# Patient Record
Sex: Female | Born: 1945
Health system: Southern US, Community
[De-identification: ages and names within clinical notes are randomized; demographics above are authoritative.]

## PROBLEM LIST (undated history)

## (undated) DIAGNOSIS — Z972 Presence of dental prosthetic device (complete) (partial): Secondary | ICD-10-CM

## (undated) DIAGNOSIS — Z923 Personal history of irradiation: Secondary | ICD-10-CM

## (undated) DIAGNOSIS — E785 Hyperlipidemia, unspecified: Secondary | ICD-10-CM

## (undated) DIAGNOSIS — M6740 Ganglion, unspecified site: Secondary | ICD-10-CM

## (undated) DIAGNOSIS — K219 Gastro-esophageal reflux disease without esophagitis: Secondary | ICD-10-CM

## (undated) DIAGNOSIS — I739 Peripheral vascular disease, unspecified: Secondary | ICD-10-CM

## (undated) DIAGNOSIS — C801 Malignant (primary) neoplasm, unspecified: Secondary | ICD-10-CM

## (undated) DIAGNOSIS — IMO0001 Reserved for inherently not codable concepts without codable children: Secondary | ICD-10-CM

## (undated) DIAGNOSIS — I10 Essential (primary) hypertension: Secondary | ICD-10-CM

## (undated) DIAGNOSIS — M1990 Unspecified osteoarthritis, unspecified site: Secondary | ICD-10-CM

## (undated) DIAGNOSIS — Z808 Family history of malignant neoplasm of other organs or systems: Secondary | ICD-10-CM

## (undated) DIAGNOSIS — G4730 Sleep apnea, unspecified: Secondary | ICD-10-CM

## (undated) DIAGNOSIS — H269 Unspecified cataract: Secondary | ICD-10-CM

## (undated) DIAGNOSIS — R0600 Dyspnea, unspecified: Secondary | ICD-10-CM

## (undated) DIAGNOSIS — G629 Polyneuropathy, unspecified: Secondary | ICD-10-CM

## (undated) DIAGNOSIS — F419 Anxiety disorder, unspecified: Secondary | ICD-10-CM

## (undated) DIAGNOSIS — R011 Cardiac murmur, unspecified: Secondary | ICD-10-CM

## (undated) DIAGNOSIS — M5090 Cervical disc disorder, unspecified, unspecified cervical region: Secondary | ICD-10-CM

## (undated) DIAGNOSIS — M25512 Pain in left shoulder: Secondary | ICD-10-CM

## (undated) DIAGNOSIS — R42 Dizziness and giddiness: Secondary | ICD-10-CM

## (undated) DIAGNOSIS — Z8489 Family history of other specified conditions: Secondary | ICD-10-CM

## (undated) DIAGNOSIS — D649 Anemia, unspecified: Secondary | ICD-10-CM

## (undated) DIAGNOSIS — Z973 Presence of spectacles and contact lenses: Secondary | ICD-10-CM

## (undated) DIAGNOSIS — K589 Irritable bowel syndrome without diarrhea: Secondary | ICD-10-CM

## (undated) HISTORY — DX: Polyneuropathy, unspecified: G62.9

## (undated) HISTORY — DX: Personal history of irradiation: Z92.3

## (undated) HISTORY — PX: VAGINAL HYSTERECTOMY: SUR661

## (undated) HISTORY — PX: OTHER SURGICAL HISTORY: SHX169

## (undated) HISTORY — PX: CHOLECYSTECTOMY: SHX55

## (undated) HISTORY — PX: ABDOMINAL HYSTERECTOMY: SHX81

## (undated) HISTORY — DX: Family history of malignant neoplasm of other organs or systems: Z80.8

## (undated) HISTORY — PX: PARTIAL HYSTERECTOMY: SHX80

## (undated) HISTORY — PX: BREAST EXCISIONAL BIOPSY: SUR124

## (undated) HISTORY — PX: BREAST SURGERY: SHX581

## (undated) HISTORY — DX: Cervical disc disorder, unspecified, unspecified cervical region: M50.90

## (undated) HISTORY — PX: BLADDER SURGERY: SHX569

## (undated) HISTORY — DX: Sleep apnea, unspecified: G47.30

## (undated) HISTORY — PX: COLONOSCOPY: SHX174

## (undated) HISTORY — DX: Unspecified cataract: H26.9

## (undated) HISTORY — DX: Gastro-esophageal reflux disease without esophagitis: K21.9

## (undated) HISTORY — DX: Hyperlipidemia, unspecified: E78.5

## (undated) HISTORY — DX: Reserved for inherently not codable concepts without codable children: IMO0001

## (undated) HISTORY — DX: Essential (primary) hypertension: I10

## (undated) HISTORY — DX: Unspecified osteoarthritis, unspecified site: M19.90

## (undated) HISTORY — DX: Dizziness and giddiness: R42

---

## 1997-07-12 ENCOUNTER — Emergency Department (HOSPITAL_COMMUNITY): Admission: EM | Admit: 1997-07-12 | Discharge: 1997-07-12 | Payer: Self-pay | Admitting: Emergency Medicine

## 1997-08-22 ENCOUNTER — Encounter: Admission: RE | Admit: 1997-08-22 | Discharge: 1997-11-20 | Payer: Self-pay | Admitting: Anesthesiology

## 1997-11-29 ENCOUNTER — Ambulatory Visit (HOSPITAL_COMMUNITY): Admission: RE | Admit: 1997-11-29 | Discharge: 1997-11-29 | Payer: Self-pay | Admitting: Orthopedic Surgery

## 1997-11-29 ENCOUNTER — Encounter: Payer: Self-pay | Admitting: Orthopedic Surgery

## 1998-02-07 ENCOUNTER — Emergency Department (HOSPITAL_COMMUNITY): Admission: EM | Admit: 1998-02-07 | Discharge: 1998-02-07 | Payer: Self-pay | Admitting: Emergency Medicine

## 1998-02-07 ENCOUNTER — Encounter: Payer: Self-pay | Admitting: *Deleted

## 1998-10-30 ENCOUNTER — Ambulatory Visit (HOSPITAL_COMMUNITY): Admission: RE | Admit: 1998-10-30 | Discharge: 1998-10-30 | Payer: Self-pay | Admitting: Orthopedic Surgery

## 1998-10-30 ENCOUNTER — Encounter: Payer: Self-pay | Admitting: Orthopedic Surgery

## 1999-01-08 ENCOUNTER — Other Ambulatory Visit: Admission: RE | Admit: 1999-01-08 | Discharge: 1999-01-08 | Payer: Self-pay | Admitting: *Deleted

## 1999-01-16 ENCOUNTER — Ambulatory Visit (HOSPITAL_COMMUNITY): Admission: RE | Admit: 1999-01-16 | Discharge: 1999-01-16 | Payer: Self-pay | Admitting: *Deleted

## 1999-01-16 ENCOUNTER — Encounter: Payer: Self-pay | Admitting: General Surgery

## 2000-08-13 ENCOUNTER — Observation Stay (HOSPITAL_COMMUNITY): Admission: RE | Admit: 2000-08-13 | Discharge: 2000-08-14 | Payer: Self-pay | Admitting: Urology

## 2000-08-24 ENCOUNTER — Emergency Department (HOSPITAL_COMMUNITY): Admission: EM | Admit: 2000-08-24 | Discharge: 2000-08-24 | Payer: Self-pay

## 2000-09-03 ENCOUNTER — Observation Stay (HOSPITAL_COMMUNITY): Admission: RE | Admit: 2000-09-03 | Discharge: 2000-09-04 | Payer: Self-pay | Admitting: Urology

## 2002-10-12 ENCOUNTER — Ambulatory Visit (HOSPITAL_BASED_OUTPATIENT_CLINIC_OR_DEPARTMENT_OTHER): Admission: RE | Admit: 2002-10-12 | Discharge: 2002-10-12 | Payer: Self-pay | Admitting: Internal Medicine

## 2002-11-13 ENCOUNTER — Ambulatory Visit (HOSPITAL_BASED_OUTPATIENT_CLINIC_OR_DEPARTMENT_OTHER): Admission: RE | Admit: 2002-11-13 | Discharge: 2002-11-13 | Payer: Self-pay | Admitting: Internal Medicine

## 2003-06-13 ENCOUNTER — Emergency Department (HOSPITAL_COMMUNITY): Admission: EM | Admit: 2003-06-13 | Discharge: 2003-06-13 | Payer: Self-pay | Admitting: Emergency Medicine

## 2003-08-30 ENCOUNTER — Emergency Department (HOSPITAL_COMMUNITY): Admission: EM | Admit: 2003-08-30 | Discharge: 2003-08-31 | Payer: Self-pay | Admitting: Emergency Medicine

## 2005-03-13 ENCOUNTER — Encounter: Payer: Self-pay | Admitting: Physician Assistant

## 2005-05-20 ENCOUNTER — Encounter: Admission: RE | Admit: 2005-05-20 | Discharge: 2005-05-20 | Payer: Self-pay | Admitting: Obstetrics and Gynecology

## 2005-07-01 ENCOUNTER — Other Ambulatory Visit: Admission: RE | Admit: 2005-07-01 | Discharge: 2005-07-01 | Payer: Self-pay | Admitting: Obstetrics and Gynecology

## 2005-11-19 ENCOUNTER — Ambulatory Visit (HOSPITAL_BASED_OUTPATIENT_CLINIC_OR_DEPARTMENT_OTHER): Admission: RE | Admit: 2005-11-19 | Discharge: 2005-11-19 | Payer: Self-pay | Admitting: Internal Medicine

## 2005-11-22 ENCOUNTER — Ambulatory Visit: Payer: Self-pay | Admitting: Internal Medicine

## 2006-07-13 ENCOUNTER — Emergency Department (HOSPITAL_COMMUNITY): Admission: EM | Admit: 2006-07-13 | Discharge: 2006-07-14 | Payer: Self-pay | Admitting: Emergency Medicine

## 2007-05-28 ENCOUNTER — Emergency Department (HOSPITAL_COMMUNITY): Admission: EM | Admit: 2007-05-28 | Discharge: 2007-05-29 | Payer: Self-pay | Admitting: Emergency Medicine

## 2007-08-02 ENCOUNTER — Emergency Department (HOSPITAL_BASED_OUTPATIENT_CLINIC_OR_DEPARTMENT_OTHER): Admission: EM | Admit: 2007-08-02 | Discharge: 2007-08-02 | Payer: Self-pay | Admitting: Emergency Medicine

## 2007-10-28 ENCOUNTER — Emergency Department (HOSPITAL_BASED_OUTPATIENT_CLINIC_OR_DEPARTMENT_OTHER): Admission: EM | Admit: 2007-10-28 | Discharge: 2007-10-28 | Payer: Self-pay | Admitting: Emergency Medicine

## 2008-10-14 ENCOUNTER — Emergency Department (HOSPITAL_COMMUNITY): Admission: EM | Admit: 2008-10-14 | Discharge: 2008-10-14 | Payer: Self-pay | Admitting: Family Medicine

## 2008-11-03 ENCOUNTER — Ambulatory Visit: Payer: Self-pay | Admitting: Physician Assistant

## 2008-11-03 DIAGNOSIS — F411 Generalized anxiety disorder: Secondary | ICD-10-CM | POA: Insufficient documentation

## 2008-11-03 DIAGNOSIS — E785 Hyperlipidemia, unspecified: Secondary | ICD-10-CM

## 2008-11-03 DIAGNOSIS — E119 Type 2 diabetes mellitus without complications: Secondary | ICD-10-CM | POA: Insufficient documentation

## 2008-11-03 DIAGNOSIS — N3281 Overactive bladder: Secondary | ICD-10-CM | POA: Insufficient documentation

## 2008-11-03 DIAGNOSIS — R011 Cardiac murmur, unspecified: Secondary | ICD-10-CM

## 2008-11-03 DIAGNOSIS — R319 Hematuria, unspecified: Secondary | ICD-10-CM

## 2008-11-03 DIAGNOSIS — G4733 Obstructive sleep apnea (adult) (pediatric): Secondary | ICD-10-CM | POA: Insufficient documentation

## 2008-11-03 DIAGNOSIS — N318 Other neuromuscular dysfunction of bladder: Secondary | ICD-10-CM | POA: Insufficient documentation

## 2008-11-04 ENCOUNTER — Encounter: Payer: Self-pay | Admitting: Physician Assistant

## 2008-11-08 ENCOUNTER — Ambulatory Visit (HOSPITAL_COMMUNITY): Admission: RE | Admit: 2008-11-08 | Discharge: 2008-11-08 | Payer: Self-pay | Admitting: Internal Medicine

## 2008-11-08 ENCOUNTER — Ambulatory Visit: Payer: Self-pay | Admitting: Cardiology

## 2008-11-08 ENCOUNTER — Encounter (INDEPENDENT_AMBULATORY_CARE_PROVIDER_SITE_OTHER): Payer: Self-pay | Admitting: Internal Medicine

## 2008-11-10 ENCOUNTER — Encounter: Payer: Self-pay | Admitting: Physician Assistant

## 2008-11-14 ENCOUNTER — Ambulatory Visit: Payer: Self-pay | Admitting: Physician Assistant

## 2008-11-16 ENCOUNTER — Encounter: Payer: Self-pay | Admitting: Physician Assistant

## 2008-11-20 ENCOUNTER — Encounter: Payer: Self-pay | Admitting: Physician Assistant

## 2008-11-28 ENCOUNTER — Ambulatory Visit: Payer: Self-pay | Admitting: Physician Assistant

## 2008-12-15 ENCOUNTER — Encounter: Payer: Self-pay | Admitting: Physician Assistant

## 2008-12-15 DIAGNOSIS — M19042 Primary osteoarthritis, left hand: Secondary | ICD-10-CM

## 2008-12-15 DIAGNOSIS — M19041 Primary osteoarthritis, right hand: Secondary | ICD-10-CM | POA: Insufficient documentation

## 2008-12-20 ENCOUNTER — Encounter: Payer: Self-pay | Admitting: Physician Assistant

## 2008-12-29 ENCOUNTER — Telehealth: Payer: Self-pay | Admitting: Physician Assistant

## 2009-01-16 ENCOUNTER — Ambulatory Visit: Payer: Self-pay | Admitting: Physician Assistant

## 2009-01-16 DIAGNOSIS — K581 Irritable bowel syndrome with constipation: Secondary | ICD-10-CM | POA: Insufficient documentation

## 2009-01-16 DIAGNOSIS — K589 Irritable bowel syndrome without diarrhea: Secondary | ICD-10-CM | POA: Insufficient documentation

## 2009-01-16 DIAGNOSIS — F418 Other specified anxiety disorders: Secondary | ICD-10-CM | POA: Insufficient documentation

## 2009-01-17 ENCOUNTER — Ambulatory Visit: Payer: Self-pay | Admitting: Physician Assistant

## 2009-01-18 ENCOUNTER — Ambulatory Visit: Payer: Self-pay | Admitting: Internal Medicine

## 2009-01-18 ENCOUNTER — Encounter: Payer: Self-pay | Admitting: Physician Assistant

## 2009-01-30 ENCOUNTER — Ambulatory Visit: Payer: Self-pay | Admitting: Physician Assistant

## 2009-01-30 DIAGNOSIS — I10 Essential (primary) hypertension: Secondary | ICD-10-CM

## 2009-02-02 ENCOUNTER — Encounter: Admission: RE | Admit: 2009-02-02 | Discharge: 2009-02-02 | Payer: Self-pay | Admitting: Internal Medicine

## 2009-02-04 ENCOUNTER — Encounter: Payer: Self-pay | Admitting: Physician Assistant

## 2009-02-13 ENCOUNTER — Ambulatory Visit: Payer: Self-pay | Admitting: Physician Assistant

## 2009-02-13 DIAGNOSIS — R82998 Other abnormal findings in urine: Secondary | ICD-10-CM

## 2009-02-13 LAB — CONVERTED CEMR LAB
CO2: 20 meq/L (ref 19–32)
Calcium: 9.6 mg/dL (ref 8.4–10.5)
Chloride: 108 meq/L (ref 96–112)
Creatinine, Ser: 0.67 mg/dL (ref 0.40–1.20)
Glucose, Bld: 145 mg/dL — ABNORMAL HIGH (ref 70–99)
Glucose, Urine, Semiquant: NEGATIVE
Ketones, urine, test strip: NEGATIVE
Potassium: 4.2 meq/L (ref 3.5–5.3)
Urobilinogen, UA: 0.2
WBC Urine, dipstick: NEGATIVE
pH: 5

## 2009-02-14 ENCOUNTER — Encounter: Payer: Self-pay | Admitting: Physician Assistant

## 2009-02-16 ENCOUNTER — Encounter: Payer: Self-pay | Admitting: Physician Assistant

## 2009-02-27 ENCOUNTER — Ambulatory Visit: Payer: Self-pay | Admitting: Physician Assistant

## 2009-02-27 ENCOUNTER — Telehealth: Payer: Self-pay | Admitting: Physician Assistant

## 2009-02-27 LAB — CONVERTED CEMR LAB
BUN: 14 mg/dL (ref 6–23)
CO2: 22 meq/L (ref 19–32)
Chloride: 108 meq/L (ref 96–112)
Creatinine, Ser: 0.69 mg/dL (ref 0.40–1.20)
Potassium: 4.2 meq/L (ref 3.5–5.3)

## 2009-02-28 ENCOUNTER — Encounter: Payer: Self-pay | Admitting: Physician Assistant

## 2009-03-13 ENCOUNTER — Ambulatory Visit: Payer: Self-pay | Admitting: Physician Assistant

## 2009-03-13 DIAGNOSIS — R0602 Shortness of breath: Secondary | ICD-10-CM | POA: Insufficient documentation

## 2009-03-13 LAB — CONVERTED CEMR LAB: Blood Glucose, Fingerstick: 155

## 2009-03-20 ENCOUNTER — Telehealth: Payer: Self-pay | Admitting: Physician Assistant

## 2009-05-01 ENCOUNTER — Telehealth: Payer: Self-pay | Admitting: Physician Assistant

## 2009-05-15 ENCOUNTER — Ambulatory Visit: Payer: Self-pay | Admitting: Physician Assistant

## 2009-05-15 ENCOUNTER — Telehealth: Payer: Self-pay | Admitting: Physician Assistant

## 2009-05-15 DIAGNOSIS — E559 Vitamin D deficiency, unspecified: Secondary | ICD-10-CM | POA: Insufficient documentation

## 2009-05-15 LAB — CONVERTED CEMR LAB
Glucose, Urine, Semiquant: NEGATIVE
Hgb A1c MFr Bld: 7.5 %
Ketones, urine, test strip: NEGATIVE
Specific Gravity, Urine: <1.005
Urobilinogen, UA: 0.2
WBC Urine, dipstick: NEGATIVE

## 2009-05-17 LAB — CONVERTED CEMR LAB
Nitrite: NEGATIVE
Protein, ur: NEGATIVE mg/dL
Specific Gravity, Urine: <1.005 — ABNORMAL LOW (ref 1.005–1.030)

## 2009-05-22 ENCOUNTER — Ambulatory Visit: Payer: Self-pay | Admitting: Physician Assistant

## 2009-05-24 ENCOUNTER — Telehealth: Payer: Self-pay | Admitting: Physician Assistant

## 2009-05-30 ENCOUNTER — Encounter: Payer: Self-pay | Admitting: Physician Assistant

## 2009-05-30 ENCOUNTER — Ambulatory Visit: Payer: Self-pay | Admitting: Internal Medicine

## 2009-05-31 LAB — CONVERTED CEMR LAB
Cholesterol: 182 mg/dL (ref 0–200)
LDL Cholesterol: 95 mg/dL (ref 0–99)
Total CHOL/HDL Ratio: 3.6
VLDL: 36 mg/dL (ref 0–40)

## 2009-08-15 ENCOUNTER — Telehealth: Payer: Self-pay | Admitting: Physician Assistant

## 2009-08-17 ENCOUNTER — Telehealth: Payer: Self-pay | Admitting: Physician Assistant

## 2009-08-17 DIAGNOSIS — K5909 Other constipation: Secondary | ICD-10-CM | POA: Insufficient documentation

## 2009-08-17 DIAGNOSIS — K5900 Constipation, unspecified: Secondary | ICD-10-CM | POA: Insufficient documentation

## 2009-08-20 ENCOUNTER — Encounter (INDEPENDENT_AMBULATORY_CARE_PROVIDER_SITE_OTHER): Payer: Self-pay | Admitting: *Deleted

## 2009-08-22 ENCOUNTER — Encounter: Payer: Self-pay | Admitting: Physician Assistant

## 2009-08-29 ENCOUNTER — Encounter (INDEPENDENT_AMBULATORY_CARE_PROVIDER_SITE_OTHER): Payer: Self-pay | Admitting: Nurse Practitioner

## 2009-08-30 ENCOUNTER — Telehealth: Payer: Self-pay | Admitting: Physician Assistant

## 2010-02-10 LAB — CONVERTED CEMR LAB
ALT: 13 units/L (ref 0–35)
Albumin: 4.6 g/dL (ref 3.5–5.2)
Basophils Relative: 0 % (ref 0–1)
Bilirubin Urine: NEGATIVE
Blood in Urine, dipstick: NEGATIVE
Eosinophils Absolute: 0.1 10*3/uL (ref 0.0–0.7)
Hemoglobin: 13.4 g/dL (ref 12.0–15.0)
Hgb A1c MFr Bld: 6.9 % — ABNORMAL HIGH (ref 4.6–6.1)
Ketones, urine, test strip: NEGATIVE
Lymphocytes Relative: 30 % (ref 12–46)
MCHC: 32.9 g/dL (ref 30.0–36.0)
MCV: 85.9 fL (ref 78.0–100.0)
Monocytes Relative: 5 % (ref 3–12)
Neutro Abs: 4.5 10*3/uL (ref 1.7–7.7)
Neutrophils Relative %: 64 % (ref 43–77)
Nitrite: NEGATIVE
Platelets: 260 10*3/uL (ref 150–400)
TSH: 1.733 microintl units/mL (ref 0.350–4.500)
Total Bilirubin: 0.5 mg/dL (ref 0.3–1.2)
Total CHOL/HDL Ratio: 5.2
Total Protein: 7.2 g/dL (ref 6.0–8.3)
Triglycerides: 143 mg/dL (ref <150)
Urobilinogen, UA: 0.2
VLDL: 29 mg/dL (ref 0–40)
Vit D, 25-Hydroxy: 11 ng/mL — ABNORMAL LOW (ref 30–89)
WBC Urine, dipstick: NEGATIVE
WBC: 7.1 10*3/uL (ref 4.0–10.5)
Whiff Test: NEGATIVE
pH: 5.5

## 2010-02-14 NOTE — Assessment & Plan Note (Signed)
Summary: FU/VITAMIN D LEVEL///GK   Vital Signs:  Patient profile:   65 year old female Height:      65 inches Weight:      198 pounds BMI:     33.07 Temp:     98.0 degrees F oral Pulse rate:   80 / minute Pulse rhythm:   regular Resp:     18 per minute BP sitting:   145 / 83  (left arm) Cuff size:   large  Vitals Entered By: [place] Shannon (May 15, 2009 3:25 PM)  Serial Vital Signs/Assessments:  Time      Position  BP       Pulse  Resp  Temp     By 4:00 PM             128/78                         Tereso Newcomer PA-C  CC: pt says she has trouble with her urinary... pt says she goes more frequently... pt says she is in pain.. pt says she has weakness in both legs and  pain in her left ankle that just comes and then stops... pt says she is constapated and has been doing OTC methods..., Hypertension Management Is Patient Diabetic? Yes Pain Assessment Patient in pain? no      CBG Result 116  Does patient need assistance? Functional Status Self care Ambulation Normal   Primary Care Provider:  Tereso Newcomer PA-C  CC:  pt says she has trouble with her urinary... pt says she goes more frequently... pt says she is in pain.. pt says she has weakness in both legs and  pain in her left ankle that just comes and then stops... pt says she is constapated and has been doing OTC methods... and Hypertension Management.  History of Present Illness: Here for follow up.  Dysuria and nocturia:  Has a h/o overactive bladder.  Has a h/o bladder tacking.  I put her back on Vesicare when she first est. and she did better at first.  She complains of pelvic pressure and urgency.  No incontinence.  Denies dysuria.  Emptying bladder provides relief.  But, her discomfort comes back.  She notes nocturia (7-8 times per night at times).  Denies heavy caffeine use or spicy foods.  Did have one glass of tea recently but this did not make worse.  Usually drinks plenty of water.  If she increase water intake, her  symptoms improve.  No hesitancy.  She notes symptoms for about 3 months.  Vesicare improved symptoms at first, but now symptoms have resurfaced.  HTN:  Feels like up due to lack of sleep from nocturia.  Depression:  Not taking zoloft and not seeing Marchelle Folks.  Feels like her mood is ok.  No thoughts of suicide.  High chol:  Taking Crestor MWF.  No reports of myalgias.     Hypertension History:      She denies headache, chest pain, dyspnea with exertion, and syncope.  She notes no problems with any antihypertensive medication side effects.        Positive major cardiovascular risk factors include female age 43 years old or older, diabetes, hyperlipidemia, and hypertension.  Negative major cardiovascular risk factors include non-tobacco-user status.     Problems Prior to Update: 1)  Vitamin D Deficiency  (ICD-268.9) 2)  Dyspnea  (ICD-786.05) 3)  Urinalysis, Abnormal  (ICD-791.9) 4)  Irritable Bowel Syndrome  (  ICD-564.1) 5)  Preventive Health Care  (ICD-V70.0) 6)  Depression  (ICD-311) 7)  Osteoarthritis  (ICD-715.90) 8)  Hematuria Unspecified  (ICD-599.70) 9)  Murmur  (ICD-785.2) 10)  Family History Diabetes 1st Degree Relative  (ICD-V18.0) 11)  Anxiety  (ICD-300.00) 12)  Essential Hypertension, Benign  (ICD-401.1) 13)  Overactive Bladder  (ICD-596.51) 14)  Sleep Apnea  (ICD-780.57) 15)  Hyperlipidemia  (ICD-272.4) 16)  Diabetes Mellitus, Type II  (ICD-250.00)  Current Medications (verified): 1)  Lorazepam 0.5 Mg Tabs (Lorazepam) .... One Tab Twice Daily 2)  Fish Oil 1000 Mg Caps (Omega-3 Fatty Acids) .... Once Daily 3)  Vitamin B-12 1000 Mcg Tabs (Cyanocobalamin) .... One Tab Daily 4)  Acetaminophen 500 Mg Tabs (Acetaminophen) .... One Tab At Night 5)  Vesicare 5 Mg Tabs (Solifenacin Succinate) .... Take 1 Tablet By Mouth Once A Day 6)  Zoloft 50 Mg Tabs (Sertraline Hcl) .... 1/2 Tablet Daily X 1 Week, Then Increase To 1 By Mouth Once Daily. 7)  Crestor 10 Mg Tabs (Rosuvastatin  Calcium) .... Take 1 Tablet By Mouth Once A Day On Monday, Wednesday and Friday Only. 8)  Vitamin D (Ergocalciferol) 50000 Unit Caps (Ergocalciferol) .Marland Kitchen.. 1 By Mouth Every Week For 12 Weeks. 9)  Cozaar 100 Mg Tabs (Losartan Potassium) .... Take 1 Tablet By Mouth Once A Day For Blood Pressure 10)  Norvasc 5 Mg Tabs (Amlodipine Besylate) .... Take One Tablet By Mouth Daily  Allergies (verified): No Known Drug Allergies  Past History:  Past Medical History: Last updated: 12/15/2008 Diabetes mellitus, type II    a.  diet controlled    b.  dx 2007 Borderline HTN    a.  given Diovan/HCT but multiple SEs; BP ok without meds Hyperlipidemia    a.  intol. to Lipitor and Zocor in past (changed diet) Sleep Apnea   a.  sleep study 11/07 Overactive Bladder Anxiety Panic disorder with agoraphobia   a.  eval by psychologist in 2009 Echocardiogram 10/2008:  Normal LVF; EF 60%; mild LVH; mild MR Osteoarthritis   a.  Left shoulder Right elbow ulnar neuropathy 2008 Chronic Constipation   a.  eval at [facility] Fibrocystic Breast Disease Congenital Left Ear Deafness Overactive Bladder  Physical Exam  General:  alert, well-developed, and well-nourished.   Head:  normocephalic and atraumatic.   Neck:  supple.   Lungs:  normal breath sounds, no crackles, and no wheezes.   Heart:  normal rate and regular rhythm.   Abdomen:  soft and non-tender.   Extremities:  no edema  Neurologic:  alert & oriented X3 and cranial nerves II-XII intact.   Psych:  normally interactive and good eye contact.     Impression & Recommendations:  Problem # 1:  ESSENTIAL HYPERTENSION, BENIGN (ICD-401.1) repeat blood pressure is at goal continue current meds  Her updated medication list for this problem includes:    Cozaar 100 Mg Tabs (Losartan potassium) .Marland Kitchen... Take 1 tablet by mouth once a day for blood pressure    Norvasc 5 Mg Tabs (Amlodipine besylate) .Marland Kitchen... Take one tablet by mouth daily  Problem # 2:  DIABETES  MELLITUS, TYPE II (ICD-250.00) refer back to dietician if A1C still over 7 after 3 mos, start metformin  Her updated medication list for this problem includes:    Cozaar 100 Mg Tabs (Losartan potassium) .Marland Kitchen... Take 1 tablet by mouth once a day for blood pressure  Orders: Capillary Blood Glucose/CBG (82948) Hgb A1C (16109UE)  Problem # 3:  OVERACTIVE BLADDER (ICD-596.51) urine  sterile will send to urology  Orders: T-Urinalysis ([address]-60454) Urology Referral (Urology)  Complete Medication List: 1)  Lorazepam 0.5 Mg Tabs (Lorazepam) .... One tab twice daily 2)  Fish Oil 1000 Mg Caps (Omega-3 fatty acids) .... Once daily 3)  Vitamin B-12 1000 Mcg Tabs (Cyanocobalamin) .... One tab daily 4)  Acetaminophen 500 Mg Tabs (Acetaminophen) .... One tab at night 5)  Vesicare 5 Mg Tabs (Solifenacin succinate) .... Take 1 tablet by mouth once a day 6)  Zoloft 50 Mg Tabs (Sertraline hcl) .... 1/2 tablet daily x 1 week, then increase to 1 by mouth once daily. 7)  Crestor 10 Mg Tabs (Rosuvastatin calcium) .... Take 1 tablet by mouth once a day on monday, wednesday and friday only. 8)  Vitamin D (ergocalciferol) 50000 Unit Caps (Ergocalciferol) .Marland Kitchen.. 1 by mouth every week for 12 weeks. 9)  Cozaar 100 Mg Tabs (Losartan potassium) .... Take 1 tablet by mouth once a day for blood pressure 10)  Norvasc 5 Mg Tabs (Amlodipine besylate) .... Take one tablet by mouth daily  Other Orders: T-Vitamin D (25-Hydroxy) (09811-91478)  Hypertension Assessment/Plan:      The patient's hypertensive risk group is category C: Target organ damage and/or diabetes.  Her calculated 10 year risk of coronary heart disease is > 32 %.  Today's blood pressure is 145/83.  Her blood pressure goal is < 130/80.  Patient Instructions: 1)  Arrange fasting lipids in the next 2 weeks (Dx 272.4). 2)  Schedule appointment with Drucilla Schmidt for diabetes refresher. 3)  Please schedule a follow-up appointment in 3 months with Scott for  diabetes and blood pressure. 4)  I will arrange for you to see urology.  Someone will contact you with an appointment. 5)     Laboratory Results   Urine Tests    Routine Urinalysis   Color: lt. yellow Appearance: Clear Glucose: negative   (Normal Range: Negative) Bilirubin: negative   (Normal Range: Negative) Ketone: negative   (Normal Range: Negative) Spec. Gravity: <1.005   (Normal Range: 1.003-1.035) Blood: negative   (Normal Range: Negative) pH: 5.5   (Normal Range: 5.0-8.0) Protein: negative   (Normal Range: Negative) Urobilinogen: 0.2   (Normal Range: 0-1) Nitrite: negative   (Normal Range: Negative) Leukocyte Esterace: negative   (Normal Range: Negative)     Blood Tests   Date/Time Received: May 15, 2009 3:39 PM   HGBA1C: 7.5%   (Normal Range: Non-Diabetic - 3-6%   Control Diabetic - 6-8%) CBG Random:: 116mg /dL

## 2010-02-14 NOTE — Letter (Signed)
Summary: RECORDS FROM [facility]IOR P[facility]  RECORDS FROM [facility] P[facility]   Imported By: Arta Bruce 02/06/2009 10:32:49  _____________________________________________________________________  External Attachment:    Type:   Image     Comment:   External Document

## 2010-02-14 NOTE — Assessment & Plan Note (Signed)
Summary: 6 WEEK FU///KT   Vital Signs:  Patient profile:   65 year old female Height:      65 inches Weight:      198 pounds BMI:     33.07 Temp:     97.9 degrees F oral Pulse rate:   87 / minute Pulse rhythm:   regular Resp:     18 per minute BP sitting:   150 / 90  (left arm) Cuff size:   large  Vitals Entered By: [place] Shannon (March 13, 2009 9:40 AM) CC: six week f/u... pt says she needs a new rx for bp med since she has new dose(cozzar)... pt says she has not been sleeping lately still.... pt says she has been getting hungry at night and has been having hot flashes..., Hypertension Management Is Patient Diabetic? Yes Pain Assessment Patient in pain? no      CBG Result 155  Does patient need assistance? Functional Status Self care Ambulation Normal   Primary Care Provider:  Tereso Newcomer PA-C  CC:  six week f/u... pt says she needs a new rx for bp med since she has new dose(cozzar)... pt says she has not been sleeping lately still.... pt says she has been getting hungry at night and has been having hot flashes... and Hypertension Management.  History of Present Illness: Here for f/u. Did not get higher dose of cozaar and is only taking 50 mg once daily since Sun.  Has not received norvasc yet.    Has a lot on nonspecific symptoms but states she feels good. She is taking vesicare and denies any further incontinence. But, she is having a lot of nocturia.  No dysuria.  No hesitancy or urgency.  SHe had a neg culture a few weeks ago.  She does drink a lot of water.  No excessive caffeine use.  I have avoided diuretics with her symptoms.  She notes hot flashes and chills.   TSH was normal in Jan.  She saw LCSW x 1 and did not go back.  She had a high score on the PHQ9 and I had previously asked her to take zoloft as she also has anxiety.  She is worried about side effects.  She does get panicked at times.  No suicidal ideations.  She is requesting to go back to [place].     She has some mild DOE.  NYHA 2.  SHe denies assoc CP, nausea, diaph.  She has gained some weight.  She is sleeping less with her nocturia.  She has noted fatigue.  CBC was ok in Jan at [facility].  Echo previously normal.  ECG in Jan was ok as well.   Hypertension History:      She complains of headache, but denies chest pain, dyspnea with exertion, and syncope.  She notes no problems with any antihypertensive medication side effects.        Positive major cardiovascular risk factors include female age 5 years old or older, diabetes, hyperlipidemia, and hypertension.  Negative major cardiovascular risk factors include non-tobacco-user status.     Current Medications (verified): 1)  Lorazepam 0.5 Mg Tabs (Lorazepam) .... One Tab Twice Daily 2)  Fish Oil 1000 Mg Caps (Omega-3 Fatty Acids) .... Once Daily 3)  Vitamin B-12 1000 Mcg Tabs (Cyanocobalamin) .... One Tab Daily 4)  Acetaminophen 500 Mg Tabs (Acetaminophen) .... One Tab At Night 5)  Vesicare 5 Mg Tabs (Solifenacin Succinate) .... Take 1 Tablet By Mouth Once A Day 6)  Zoloft 50 Mg Tabs (Sertraline Hcl) .... 1/2 Tablet Daily X 1 Week, Then Increase To 1 By Mouth Once Daily. 7)  Crestor 10 Mg Tabs (Rosuvastatin Calcium) .... Take 1 Tablet By Mouth Once A Day On Monday, Wednesday and Friday Only. 8)  Vitamin D (Ergocalciferol) 50000 Unit Caps (Ergocalciferol) .Marland Kitchen.. 1 By Mouth Every Week For 12 Weeks. 9)  Cozaar 50 Mg Tabs (Losartan Potassium) .... Take 1 Tablet By Mouth Once A Day For Blood Pressure 10)  Norvasc 5 Mg Tabs (Amlodipine Besylate) .... Take One Tablet By Mouth Daily  Allergies (verified): No Known Drug Allergies  Past History:  Past Medical History: Last updated: 12/15/2008 Diabetes mellitus, type II    a.  diet controlled    b.  dx 2007 Borderline HTN    a.  given Diovan/HCT but multiple SEs; BP ok without meds Hyperlipidemia    a.  intol. to Lipitor and Zocor in past (changed diet) Sleep Apnea   a.  sleep study  11/07 Overactive Bladder Anxiety Panic disorder with agoraphobia   a.  eval by psychologist in 2009 Echocardiogram 10/2008:  Normal LVF; EF 60%; mild LVH; mild MR Osteoarthritis   a.  Left shoulder Right elbow ulnar neuropathy 2008 Chronic Constipation   a.  eval at [facility] Fibrocystic Breast Disease Congenital Left Ear Deafness Overactive Bladder  Family History: Reviewed history from 11/03/2008 and no changes required. Family History Diabetes 1st degree relative (sister, father) CA - cousin with brain cancer Family History Hypertension (mom, dad, 2 sisters, bro)  Physical Exam  General:  alert, well-developed, and well-nourished.   Head:  normocephalic and atraumatic.   Neck:  supple and no carotid bruits.   Lungs:  normal breath sounds, no crackles, and no wheezes.   Heart:  normal rate and regular rhythm.   Abdomen:  soft, non-tender, and no hepatomegaly.   no pelvic pain with palp Neurologic:  alert & oriented X3 and cranial nerves II-XII intact.   Psych:  normally interactive and good eye contact.     Impression & Recommendations:  Problem # 1:  DEPRESSION (ICD-311) suspect she is having a lot of symptoms related to this she is willing to try zoloft now refer back to LCSW f/u with me in 2 weeks  Her updated medication list for this problem includes:    Lorazepam 0.5 Mg Tabs (Lorazepam) ..... One tab twice daily    Zoloft 50 Mg Tabs (Sertraline hcl) .Marland Kitchen... 1/2 tablet daily x 1 week, then increase to 1 by mouth once daily.  Problem # 2:  OVERACTIVE BLADDER (ICD-596.51) overall improved however, she has nocturia sounds like 2/2 increased fluid intake try to adjust if no better, refer to urology  Problem # 3:  ESSENTIAL HYPERTENSION, BENIGN (ICD-401.1) uncontrolled needs to get on proper dose of meds  Her updated medication list for this problem includes:    Cozaar 100 Mg Tabs (Losartan potassium) .Marland Kitchen... Take 1 tablet by mouth once a day for blood pressure     Norvasc 5 Mg Tabs (Amlodipine besylate) .Marland Kitchen... Take one tablet by mouth daily  Problem # 4:  DYSPNEA (ICD-786.05) I think related to lack of sleep and weight gain monitor for now  Complete Medication List: 1)  Lorazepam 0.5 Mg Tabs (Lorazepam) .... One tab twice daily 2)  Fish Oil 1000 Mg Caps (Omega-3 fatty acids) .... Once daily 3)  Vitamin B-12 1000 Mcg Tabs (Cyanocobalamin) .... One tab daily 4)  Acetaminophen 500 Mg Tabs (Acetaminophen) .... One tab  at night 5)  Vesicare 5 Mg Tabs (Solifenacin succinate) .... Take 1 tablet by mouth once a day 6)  Zoloft 50 Mg Tabs (Sertraline hcl) .... 1/2 tablet daily x 1 week, then increase to 1 by mouth once daily. 7)  Crestor 10 Mg Tabs (Rosuvastatin calcium) .... Take 1 tablet by mouth once a day on monday, wednesday and friday only. 8)  Vitamin D (ergocalciferol) 50000 Unit Caps (Ergocalciferol) .Marland Kitchen.. 1 by mouth every week for 12 weeks. 9)  Cozaar 100 Mg Tabs (Losartan potassium) .... Take 1 tablet by mouth once a day for blood pressure 10)  Norvasc 5 Mg Tabs (Amlodipine besylate) .... Take one tablet by mouth daily  Hypertension Assessment/Plan:      The patient's hypertensive risk group is category C: Target organ damage and/or diabetes.  Her calculated 10 year risk of coronary heart disease is > 32 %.  Today's blood pressure is 150/90.  Her blood pressure goal is < 130/80.  Patient Instructions: 1)  Start the norvasc for blood pressure. 2)  Get the new dose of cozaar and start for your blood pressure. 3)  Start the Zoloft. 4)  Schedule follow up appt with Ethelene Browns. 5)  Schedule follow up with Scott in 2 weeks for 311. 6)  Stop drinking water at 7pm every night.  Limit caffeine to the morning and no more than 1-2 cups per day.  Avoid sodas, spicy foods as much as possible. 7)  See if these steps help your urination at night. 8)  I think the zoloft will also help your hot flashes. 9)  If you can take an aspirin, start taking aspirin 81  mg once daily. Prescriptions: COZAAR 100 MG TABS (LOSARTAN POTASSIUM) Take 1 tablet by mouth once a day for blood pressure  #30 x 5   Entered and Authorized by:   Tereso Newcomer PA-C   Signed by:   Tereso Newcomer PA-C on 03/13/2009   Method used:   Print then Give to Patient   RxID:   [id number]

## 2010-02-14 NOTE — Assessment & Plan Note (Signed)
Summary: 2 WEEK FU FOR BP///KT   Vital Signs:  Patient profile:   65 year old female Height:      65 inches Weight:      198 pounds BMI:     33.07 Temp:     97.8 degrees F oral Pulse rate:   77 / minute Pulse rhythm:   regular Resp:     20 per minute BP sitting:   158 / 89  (left arm) Cuff size:   large  Vitals Entered By: [place] Shannon (January 30, 2009 9:56 AM)  Serial Vital Signs/Assessments:  Time      Position  BP       Pulse  Resp  Temp     By 10:51 AM            160/90                         Tereso Newcomer PA-C  CC: two week f/u.... pt says she is still constapated..... Is Patient Diabetic? Yes Pain Assessment Patient in pain? no      CBG Result 162  Does patient need assistance? Functional Status Self care Ambulation Normal   Primary Care Provider:  Tereso Newcomer PA-C  CC:  two week f/u.... pt says she is still constapated.....Marland Kitchen  History of Present Illness: 65 year old female returns for two-week followup.  I placed her on Zoloft at last visit.  This visit was to followup on the Zoloft and to recheck her blood pressure.  Depression: The patient has met with the Child psychotherapist.  She has decided to not start Zoloft.  She does not feel she needs it.  She likes to avoid medications that may be addictive.  I explained her that SSRIs without addictive.  She understands this but would like to hold off on medication.  She denies suicidal ideations.  Her mood seems to be stable.  Hypertension: The patient had previously been on Diovan/HCTZ.  She notes that she was very lethargic with this and lightheaded.  It sounds as though she may have not needed as high of a dose as she was taking.  She did have an echocardiogram a couple of months ago that showed mild LVH and normal LV function.  She denies chest pain, shortness of breath, syncope, headaches.  Problems Prior to Update: 1)  Irritable Bowel Syndrome  (ICD-564.1) 2)  Preventive Health Care  (ICD-V70.0) 3)  Depression   (ICD-311) 4)  Osteoarthritis  (ICD-715.90) 5)  Hematuria Unspecified  (ICD-599.70) 6)  Murmur  (ICD-785.2) 7)  Family History Diabetes 1st Degree Relative  (ICD-V18.0) 8)  Anxiety  (ICD-300.00) 9)  Essential Hypertension, Benign  (ICD-401.1) 10)  Overactive Bladder  (ICD-596.51) 11)  Sleep Apnea  (ICD-780.57) 12)  Hyperlipidemia  (ICD-272.4) 13)  Diabetes Mellitus, Type II  (ICD-250.00)  Current Medications (verified): 1)  Lorazepam 0.5 Mg Tabs (Lorazepam) .... One Tab Twice Daily 2)  Fish Oil 1000 Mg Caps (Omega-3 Fatty Acids) .... Once Daily 3)  Vitamin B-12 1000 Mcg Tabs (Cyanocobalamin) .... One Tab Daily 4)  Acetaminophen 500 Mg Tabs (Acetaminophen) .... One Tab At Night 5)  Vesicare 5 Mg Tabs (Solifenacin Succinate) .... Take 1 Tablet By Mouth Once A Day 6)  Zoloft 50 Mg Tabs (Sertraline Hcl) .... 1/2 Tablet Daily X 1 Week, Then Increase To 1 By Mouth Once Daily. 7)  Crestor 10 Mg Tabs (Rosuvastatin Calcium) .... Take 1 Tablet By Mouth Once A Day On Monday,  Wednesday and Friday Only. 8)  Vitamin D (Ergocalciferol) 50000 Unit Caps (Ergocalciferol) .Marland Kitchen.. 1 By Mouth Every Week For 12 Weeks.  Allergies (verified): No Known Drug Allergies  Physical Exam  General:  alert, well-developed, and well-nourished.   Head:  normocephalic and atraumatic.   Neck:  no bruits Lungs:  normal breath sounds, no crackles, and no wheezes.   Heart:  normal rate and regular rhythm.   Extremities:  no edema Neurologic:  alert & oriented X3 and cranial nerves II-XII intact.   Psych:  normally interactive, good eye contact, and not depressed appearing.     Impression & Recommendations:  Problem # 1:  ESSENTIAL HYPERTENSION, BENIGN (ICD-401.1)  needs tx patient had side effects on diovan/hct . . . likely didn't need that much med try cozaar with diabetes . . . reports h/o cough (?from ACE) repeat bmet in 2 weeks with bp check see me in 6 weeks  Her updated medication list for this problem  includes:    Cozaar 50 Mg Tabs (Losartan potassium) .Marland Kitchen... Take 1 tablet by mouth once a day for blood pressure  Problem # 2:  DEPRESSION (ICD-311) pt does not want to take zoloft wants to continue seeing LCSW denies SI  Her updated medication list for this problem includes:    Lorazepam 0.5 Mg Tabs (Lorazepam) ..... One tab twice daily    Zoloft 50 Mg Tabs (Sertraline hcl) .Marland Kitchen... 1/2 tablet daily x 1 week, then increase to 1 by mouth once daily.  Problem # 3:  IRRITABLE BOWEL SYNDROME (ICD-564.1) explained to her that taking zoloft may help will try daily metamucil stool cards pending if neg, refer to Dr. Corinda G[place] in [place] for colo  Complete Medication List: 1)  Lorazepam 0.5 Mg Tabs (Lorazepam) .... One tab twice daily 2)  Fish Oil 1000 Mg Caps (Omega-3 fatty acids) .... Once daily 3)  Vitamin B-12 1000 Mcg Tabs (Cyanocobalamin) .... One tab daily 4)  Acetaminophen 500 Mg Tabs (Acetaminophen) .... One tab at night 5)  Vesicare 5 Mg Tabs (Solifenacin succinate) .... Take 1 tablet by mouth once a day 6)  Zoloft 50 Mg Tabs (Sertraline hcl) .... 1/2 tablet daily x 1 week, then increase to 1 by mouth once daily. 7)  Crestor 10 Mg Tabs (Rosuvastatin calcium) .... Take 1 tablet by mouth once a day on monday, wednesday and friday only. 8)  Vitamin D (ergocalciferol) 50000 Unit Caps (Ergocalciferol) .Marland Kitchen.. 1 by mouth every week for 12 weeks. 9)  Cozaar 50 Mg Tabs (Losartan potassium) .... Take 1 tablet by mouth once a day for blood pressure  Patient Instructions: 1)  Drink one glass of metamucil once daily for your bowels. 2)  Return in 2 weeks for labs (BMET; 401.1) and BP check. 3)  Schedule follow up appointment with Rockelle Heuerman in 6 weeks for blood pressure. Prescriptions: COZAAR 50 MG TABS (LOSARTAN POTASSIUM) Take 1 tablet by mouth once a day for blood pressure  #30 x 5   Entered and Authorized by:   Tereso Newcomer PA-C   Signed by:   Tereso Newcomer PA-C on 01/30/2009   Method used:   Print  then Give to Patient   RxID:   [id number]   Appended Document: Hemoccult results  Laboratory Results    Stool - Occult Blood Hemmoccult #1: negative Date: 02/15/2009 Hemoccult #2: negative Date: 02/15/2009 Hemoccult #3: negative Date: 02/15/2009

## 2010-02-14 NOTE — Progress Notes (Signed)
Summary: Has persistent cough, thinks it's BP meds  Phone Note Call from Patient   Summary of Call: The pt states that she developed a severe cough and she may think is perhaps because her htn medication.  Pt needs the Sybella Harnish call her back. Alben Spittle PA-c Initial call taken by: Manon Hilding,  August 15, 2009 8:28 AM  Follow-up for Phone Call        Past several weeks, having a cough "out of nowhere", getting worse.  Feels like she's "strangling", needs to drink water.  Cough is sporadic, feels like a "tickle" when it comes on.  Loud, harsh, hacking cough.  Denies fever, malaise , no nausea or vomiting, sore throat.  Non-productive at first, now just a little white mucus once in awhile.  No headache usually, ear pain or nasal discharge.  Had noticed a little headache yesterday, but it went away, a "nagging" type of pain.  Thinks it might be her BP meds.   Follow-up by: Dutch Quint RN,  August 15, 2009 9:58 AM  Additional Follow-up for Phone Call Additional follow up Details #1::        Cozaar is an ARB and does not carry side effect of cough like ACE inhib's (lisinopril, etc.) Does she have a h/o allergies? She may have increased allergy symptoms related to the humidity and weather.  She can try Zyrtec 10 mg once daily or benadryl 25 mg 1 by mouth every 6-8 hrs as needed. Or, she may have an URI.  Suggest she be seen for appt if symptoms are worsening or not getting better or she is running a fever, purulent sputum or hemoptysis, etc. Additional Follow-up by: Tereso Newcomer PA-C,  August 15, 2009 10:29 AM    Additional Follow-up for Phone Call Additional follow up Details #2::    Left message with granddaughter for pt. to return call.  Dutch Quint RN  August 15, 2009 10:49 AM  Left message with granddaughter for pt. to return call.  Dutch Quint RN  August 16, 2009 4:53 PM  Advised of Sandor Arboleda's response and  recommendations -- denies hx of allergies or illness, denies productive cough.   States she will try Zyrtec as suggested. Follow-up by: Dutch Quint RN,  August 17, 2009 10:05 AM

## 2010-02-14 NOTE — Progress Notes (Signed)
  Phone Note Outgoing Call   Summary of Call: Patient needs f/u Vit D level.  She has finished her Rx at [facility] pharmacy. Order 71 Hydroxyvitamin D Initial call taken by: Brynda Rim,  May 01, 2009 4:46 PM  Follow-up for Phone Call        Left message on answering machine for pt to call back.Laura [name]  May 02, 2009 12:38 PM   spoke with pt and she is aware... Laura [name]  May 02, 2009 4:37 PM

## 2010-02-14 NOTE — Letter (Signed)
Summary: NUTRITIONIST SUMMARY//SUSIE  NUTRITIONIST SUMMARY//SUSIE   Imported By: Arta Bruce 01/29/2009 15:58:09  _____________________________________________________________________  External Attachment:    Type:   Image     Comment:   External Document

## 2010-02-14 NOTE — Letter (Signed)
Summary: NUTRITIONAL [place]//SUSIE  NUTRITIONAL [place]//SUSIE   Imported By: Arta Bruce 06/04/2009 09:14:01  _____________________________________________________________________  External Attachment:    Type:   Image     Comment:   External Document

## 2010-02-14 NOTE — Letter (Signed)
Summary: *HSN Results Follow up  [facility]  [address] [place], [place] [address]   Phone: [phone]  Fax: [phone]      01/18/2009   Laura [name] Shon-MILLNER [address] [name], [place]  [address]   Dear  Ms. [name],                            ____S.Drinkard,FNP   ____D. Gore,FNP       ____B. McPherson,MD   ____V. Rankins,MD    ____E. Mulberry,MD    ____N. Daphine Deutscher, FNP  ____D. Reche Dixon, MD    ____K. Philipp Deputy, MD    __x__S. Alben Spittle, PA-C    This letter is to inform you that your recent test(s):  ___x____Pap Smear    _______Lab Test     _______X-ray    ___x____ is within acceptable limits  _______ requires a medication change  _______ requires a follow-up lab visit  _______ requires a follow-up visit with your provider   Comments:       _________________________________________________________ If you have any questions, please contact our office                     Sincerely,  Tereso Newcomer PA-C [facility]

## 2010-02-14 NOTE — Letter (Signed)
Summary: REFERRAL//SOCIAL WORK//AMAND//APPT DASTE & TIME  REFERRAL//SOCIAL WORK//AMAND//APPT DASTE & TIME   Imported By: Arta Bruce 03/07/2009 15:13:31  _____________________________________________________________________  External Attachment:    Type:   Image     Comment:   External Document

## 2010-02-14 NOTE — Progress Notes (Signed)
Summary: HAS ATENA  Phone Note Call from Patient Call back at Home Phone [phone]   Summary of Call: WEAVER PT. MS MILLNER CALLED AND SAYS THAT SHE HAS AT[place] INS. AND WANTS TO KNOW IF YOU KNOW OF ANY [facility] THAT YOU CAN REFER HER TO THAT ACCEPTS GROUP INS. Initial call taken by: Leodis Rains,  August 30, 2009 10:42 AM  Follow-up for Phone Call        Defer question to either [facility] or [facility]. Possibility that [facility] will accept [employer] and other private insurances but not at this time.  Follow-up by: Lehman Prom FNP,  August 30, 2009 5:05 PM  Additional Follow-up for Phone Call Additional follow up Details #1::        If she is a current patient we will continue to see her even with private insurance. We will file the insurance as an out of network Mavis Fichera. We are just not taking any new patients with private insurance until we are credentialed. Additional Follow-up by: Hassell Halim CMA,  September 05, 2009 9:26 AM    Additional Follow-up for Phone Call Additional follow up Details #2::    Pt. advised of R. Edward's response.  Verbalized understanding and will return to this office if insurance status changes. Follow-up by: Dutch Quint RN,  September 05, 2009 10:03 AM

## 2010-02-14 NOTE — Assessment & Plan Note (Signed)
Summary: 3 MONTHS FU FOR CPP///KT   Vital Signs:  Patient profile:   65 year old female Height:      65 inches Weight:      196 pounds BMI:     32.73 Temp:     98.2 degrees F oral Pulse rate:   71 / minute Pulse rhythm:   regular Resp:     18 per minute BP sitting:   163 / 79  (left arm) Cuff size:   large  Vitals Entered By: Armenia Shannon (January 16, 2009 9:40 AM) CC: cpp.... pt wants you to check her right second toe where toenail is hanging off... CBG Result 157  Does patient need assistance? Functional Status Self care Ambulation Normal   Primary Care Provider:  Tereso Newcomer PA-C  CC:  cpp.... pt wants you to check her right second toe where toenail is hanging off....  History of Present Illness: Patient here for CPP.  Has had trouble with constipation.  She feels like she cannot use the bathroom like she is constipated.  But, after she does start to go, she has soft stools.  No runny stools.  She notes BMs 1-2 x per day.  She has a feeling like she needs to go to have a BM.  When she finally feels like she has to go, she has an urgency and has to rush to the bathroom.  She has had some incontinence at times.  She also notes that she has had some hard BMs like she is constipated.  She has been eating more fiber.  She cut back on her fiber and this did seem to help.  She also notes a lot of gas.  No vomiting or fevers.  She had been eval. at [facility] in the past for constipation.  She was told all she needed to do was increase fiber at that time.  She is s/p TAH.  She may have had pap smear a few years ago. She has not had a mammo in 3 years.  She thinks she had a DEXA scan done. . . no record rec'd. States she had a Td shot in last 10 years ago.  She refuses to get flu shot.  Says she had pnuemovax already. PHQ9=20 today.  She denies having suicidal ideations.  She does have anxiety d/o and takes lorazepam.  She has not been on meds in the past.   Problems Prior to  Update: 1)  Irritable Bowel Syndrome  (ICD-564.1) 2)  Preventive Health Care  (ICD-V70.0) 3)  Depression  (ICD-311) 4)  Osteoarthritis  (ICD-715.90) 5)  Hematuria Unspecified  (ICD-599.70) 6)  Murmur  (ICD-785.2) 7)  Family History Diabetes 1st Degree Relative  (ICD-V18.0) 8)  Anxiety  (ICD-300.00) 9)  Elevated Bp Reading Without Dx Hypertension  (ICD-796.2) 10)  Overactive Bladder  (ICD-596.51) 11)  Sleep Apnea  (ICD-780.57) 12)  Hyperlipidemia  (ICD-272.4) 13)  Diabetes Mellitus, Type II  (ICD-250.00)  Current Medications (verified): 1)  Lorazepam 0.5 Mg Tabs (Lorazepam) .... One Tab Twice Daily 2)  Fish Oil 1000 Mg Caps (Omega-3 Fatty Acids) .... Once Daily 3)  Vitamin B-12 1000 Mcg Tabs (Cyanocobalamin) .... One Tab Daily 4)  Acetaminophen 500 Mg Tabs (Acetaminophen) .... One Tab At Night 5)  Vesicare 5 Mg Tabs (Solifenacin Succinate) .... Take 1 Tablet By Mouth Once A Day  Allergies (verified): No Known Drug Allergies  Past History:  Past Medical History: Last updated: 12/15/2008 Diabetes mellitus, type II  a.  diet controlled    b.  dx 2007 Borderline HTN    a.  given Diovan/HCT but multiple SEs; BP ok without meds Hyperlipidemia    a.  intol. to Lipitor and Zocor in past (changed diet) Sleep Apnea   a.  sleep study 11/07 Overactive Bladder Anxiety Panic disorder with agoraphobia   a.  eval by psychologist in 2009 Echocardiogram 10/2008:  Normal LVF; EF 60%; mild LVH; mild MR Osteoarthritis   a.  Left shoulder Right elbow ulnar neuropathy 2008 Chronic Constipation   a.  eval at [facility] Fibrocystic Breast Disease Congenital Left Ear Deafness Overactive Bladder  Review of Systems  The patient denies fever, chest pain, syncope, dyspnea on exertion, prolonged cough, hemoptysis, melena, hematochezia, severe indigestion/heartburn, hematuria, unusual weight change, and breast masses.    Physical Exam  General:  alert, well-developed, and well-nourished.   Head:   normocephalic and atraumatic.   Eyes:  pupils equal, pupils round, pupils reactive to light, and no optic disk abnormalities.   Ears:  R ear normal and L ear normal.   Nose:  no nasal discharge.   Mouth:  pharynx pink and moist, no erythema, and no exudates.   Neck:  supple, no thyromegaly, no carotid bruits, and no cervical lymphadenopathy.   Breasts:  skin/areolae normal, no masses, no abnormal thickening, no nipple discharge, no tenderness, and no adenopathy.   Lungs:  normal respiratory effort, normal breath sounds, no crackles, and no wheezes.   Heart:  normal rate and regular rhythm.   Abdomen:  soft, non-tender, normal bowel sounds, and no hepatomegaly.   Rectal:  no external abnormalities.   patient deferred rectal exam due to urge for BM Genitalia:  normal introitus, no external lesions, no vaginal discharge, mucosa pink and moist, no vaginal or cervical lesions, no vaginal atrophy, no friaility or hemorrhage, and no adnexal masses or tenderness.   cervix absent  Msk:  normal ROM.   Extremities:  no edema Neurologic:  alert & oriented X3, cranial nerves II-XII intact, strength normal in all extremities, and DTRs symmetrical and normal.   Skin:  turgor normal.   Psych:  normally interactive, good eye contact, and not depressed appearing.    Diabetes Management Exam:    Foot Exam (with socks and/or shoes not present):       Sensory-Monofilament:          Left foot: normal          Right foot: normal   Impression & Recommendations:  Problem # 1:  Preventive Health Care (ICD-V70.0) vaginal smear today if normal, prob. will not need another  get mammo get DEXA get Vit. D level stool cards consider referral to Dr. Corinda Gub[place] for [place]o . . . sooner if bowel issues do not resolve or heme + on cards  Orders: T-Comprehensive Metabolic Panel [phone]) T-Lipid Profile [phone]) T-CBC w/Diff [phone]) T-TSH [phone]) KOH/ WET Mount ([phone]) T-Pap Smear,  Thin Prep (74259) Dexa scan (Dexa scan) Hemoccult Cards -3 specimans (take home) ([address]387) Mammogram (Screening) (Mammo) T- * Misc. Laboratory test [phone]) EKG w/ Interpretation (93000) UA Dipstick w/o Micro (manual) (29518)  Problem # 2:  DEPRESSION (ICD-311)  fairly high PHQ9 score long talk with patient regarding medical therapy with h/o anxiety think zoloft would be a good choice she is hesitant but willing to try will also refer to LCSW  Her updated medication list for this problem includes:    Lorazepam 0.5 Mg Tabs (Lorazepam) ..... One tab  twice daily    Zoloft 50 Mg Tabs (Sertraline hcl) .Marland Kitchen... 1/2 tablet daily x 1 week, then increase to 1 by mouth once daily.  Orders: Social Work Referral (Social )  Problem # 3:  IRRITABLE BOWEL SYNDROME (ICD-564.1) most likely cause of her loose stools add SSRI as above cont fiber, but she can cut back some to ease side effects  Problem # 4:  ELEVATED BP READING WITHOUT DX HYPERTENSION (ICD-796.2) repeat BP 140/90 per CMA she did have LVH on Echo (mild) she will f/u in 2 weeks for depression if continues to be high, will need to start meds she had multiple intolerances in the past  Orders: EKG w/ Interpretation (93000)  Problem # 5:  HYPERLIPIDEMIA (ICD-272.4) check labs today  Orders: T-Comprehensive Metabolic Panel ([id number]) T-Lipid Profile ([id number])  Problem # 6:  OVERACTIVE BLADDER (ICD-596.51) better with vesicare repeat u/a today was good  Problem # 7:  DIABETES MELLITUS, TYPE II (ICD-250.00) check A1C today she is currently diet controlled schedule retasure  Orders: T- Hemoglobin A1C ([id number]) T-Urine Microalbumin w/creat. ratio [phone])  Complete Medication List: 1)  Lorazepam 0.5 Mg Tabs (Lorazepam) .... One tab twice daily 2)  Fish Oil 1000 Mg Caps (Omega-3 fatty acids) .... Once daily 3)  Vitamin B-12 1000 Mcg Tabs (Cyanocobalamin) .... One tab daily 4)  Acetaminophen 500 Mg Tabs  (Acetaminophen) .... One tab at night 5)  Vesicare 5 Mg Tabs (Solifenacin succinate) .... Take 1 tablet by mouth once a day 6)  Zoloft 50 Mg Tabs (Sertraline hcl) .... 1/2 tablet daily x 1 week, then increase to 1 by mouth once daily.  Patient Instructions: 1)  Schedule retasure at [facility] 2)  Please schedule a follow-up appointment in 2 weeks with Yaiden Yang for 311 and blood pressure. 3)  Continue with fiber, but cut back some to decrease side effects of gas and frequent trips to the bathroom. 4)  Complete stool cards and return to our office. 5)  Schedule appointment with Marchelle Folks. Prescriptions: ZOLOFT 50 MG TABS (SERTRALINE HCL) 1/2 tablet daily x 1 week, then increase to 1 by mouth once daily.  #30 x 3   Entered and Authorized by:   Tereso Newcomer PA-C   Signed by:   Tereso Newcomer PA-C on 01/16/2009   Method used:   Print then Give to Patient   RxID:   [id number]   Laboratory Results   Urine Tests  Date/Time Received: January 16, 2009 10:02 AM   Routine Urinalysis   Glucose: negative   (Normal Range: Negative) Bilirubin: negative   (Normal Range: Negative) Ketone: negative   (Normal Range: Negative) Spec. Gravity: 1.025   (Normal Range: 1.003-1.035) Blood: negative   (Normal Range: Negative) pH: 5.5   (Normal Range: 5.0-8.0) Protein: negative   (Normal Range: Negative) Urobilinogen: 0.2   (Normal Range: 0-1) Nitrite: negative   (Normal Range: Negative) Leukocyte Esterace: negative   (Normal Range: Negative)     Blood Tests     CBG Random:: 157mg /dL    [employer] Mount Source: vaginal WBC/hpf: 1-5 Bacteria/hpf: rare Clue cells/hpf: few  Negative whiff Yeast/hpf: none Wet Mount KOH: Negative Trichomonas/hpf: none           Diabetic Foot Exam Last Podiatry Exam Date: 01/16/2009  Foot Inspection Is there a history of a foot ulcer?              No Is there a foot ulcer now?  No Is there swelling or an abnormal foot shape?           No Are the toenails long?                No Are the toenails thick?                No Are the toenails ingrown?              No Is there heavy callous build-up?              No Is there a claw toe deformity?                          No Is there elevated skin temperature?            No Is there limited ankle dorsiflexion?            No Is there foot or ankle muscle weakness?            No Do you have pain in calf while walking?           No      Diabetic Foot Care Education :Patient educated on appropriate care of diabetic feet.     10-g (5.07) Semmes-Weinstein Monofilament Test Performed by: Armenia Shannon          Right Foot          Left Foot Visual Inspection               Test Control      normal         normal Site 1         normal         normal Site 2         normal         normal Site 3         normal         normal Site 4         normal         normal Site 5         normal         normal Site 6         normal         normal Site 7         normal         normal Site 8         normal         normal Site 9         normal         normal Site 10         normal         normal  Impression      normal         normal    EKG  Procedure date:  01/16/2009  Findings:      NSR  HR 71 Normal axis NSSTTW changes    Appended Document: 3 MONTHS FU FOR CPP///KT    Clinical Lists Changes  Problems: Assessed DEPRESSION as comment only - referred to LCSW rec'd correspondence that patient not planning on taking zoloft she is going to f/u with LCSW in 02/2009 she also plans to increase activity (exercise, etc)  Her updated medication list for this problem includes:    Lorazepam 0.5 Mg Tabs (Lorazepam) ..... One tab  twice daily    Zoloft 50 Mg Tabs (Sertraline hcl) .Marland Kitchen... 1/2 tablet daily x 1 week, then increase to 1 by mouth once daily.         Impression & Recommendations:  Problem # 1:  DEPRESSION (ICD-311) Assessment Comment Only referred to LCSW rec'd  correspondence that patient not planning on taking zoloft she is going to f/u with LCSW in 02/2009 she also plans to increase activity (exercise, etc)  Her updated medication list for this problem includes:    Lorazepam 0.5 Mg Tabs (Lorazepam) ..... One tab twice daily    Zoloft 50 Mg Tabs (Sertraline hcl) .Marland Kitchen... 1/2 tablet daily x 1 week, then increase to 1 by mouth once daily.  Complete Medication List: 1)  Lorazepam 0.5 Mg Tabs (Lorazepam) .... One tab twice daily 2)  Fish Oil 1000 Mg Caps (Omega-3 fatty acids) .... Once daily 3)  Vitamin B-12 1000 Mcg Tabs (Cyanocobalamin) .... One tab daily 4)  Acetaminophen 500 Mg Tabs (Acetaminophen) .... One tab at night 5)  Vesicare 5 Mg Tabs (Solifenacin succinate) .... Take 1 tablet by mouth once a day 6)  Zoloft 50 Mg Tabs (Sertraline hcl) .... 1/2 tablet daily x 1 week, then increase to 1 by mouth once daily. 7)  Crestor 10 Mg Tabs (Rosuvastatin calcium) .... Take 1 tablet by mouth once a day on monday, wednesday and friday only. 8)  Vitamin D (ergocalciferol) 50000 Unit Caps (Ergocalciferol) .Marland Kitchen.. 1 by mouth every week for 12 weeks.

## 2010-02-14 NOTE — Letter (Signed)
Summary: *HSN Results Follow up  [facility]  [address] [place], [place] [address]   Phone: [phone]  Fax: [phone]      02/16/2009   Laura FORKER Shams-MILLNER [address] [place], [place]  [address]   Dear  Ms. [name],                            ____S.Drinkard,FNP   ____D. Gore,FNP       ____B. McPherson,MD   ____V. Rankins,MD    ____E. Mulberry,MD    ____N. Daphine Deutscher, FNP  ____D. Reche Dixon, MD    ____K. Philipp Deputy, MD    __x__S. Alben Spittle, PA-C     This letter is to inform you that your recent test(s):  _______Pap Smear    _______Lab Test     _______X-ray    _______ is within acceptable limits  _______ requires a medication change  _______ requires a follow-up lab visit  _______ requires a follow-up visit with your provider   Comments:  Bone Density was normal.       _________________________________________________________ If you have any questions, please contact our office                     Sincerely,  Tereso Newcomer PA-C [facility]

## 2010-02-14 NOTE — Letter (Signed)
Summary: Handout Printed  Printed Handout:  - Diet - High-Fiber 

## 2010-02-14 NOTE — Letter (Signed)
Summary: *HSN Results Follow up  [facility]  [address] [place], [place] [address]   Phone: [phone]  Fax: [phone]      02/16/2009   Laura [name] [address] [place], [place]  [address]   Dear  [name],                            ____S.Drinkard,FNP   ____D. Gore,FNP       ____B. McPherson,MD   ____V. Rankins,MD    ____E. Mulberry,MD    ____N. Daphine Deutscher, FNP  ____D. Reche Dixon, MD    ____K. Philipp Deputy, MD    __x__S. Alben Spittle, PA-C     This letter is to inform you that your recent test(s):  _______Pap Smear    _______Lab Test     _______X-ray    _______ is within acceptable limits  _______ requires a medication change  _______ requires a follow-up lab visit  _______ requires a follow-up visit with your provider   Comments: Stool test negative for blood.       _________________________________________________________ If you have any questions, please contact our office                     Sincerely,  Tereso Newcomer PA-C [facility]

## 2010-02-14 NOTE — Letter (Signed)
Summary: Generic Letter  [facility]  [address] [place], [place] [address]   Phone: [phone]  Fax: [phone]        08/22/2009  [facility] [address] [place], [place]  [address]  Dear Ms. Creighton-MILLNER,  We have been unable to contact you by telephone.  Please call our office, at your earliest convenience, so that we may speak with you.   Sincerely,   Dutch Quint RN

## 2010-02-14 NOTE — Miscellaneous (Signed)
Summary: Bone Density Normal  Clinical Lists Changes  Observations: Added new observation of BONE DENSITY: T Score 0.3  (02/02/2009 16:40)      Bone Density  Procedure date:  02/02/2009  Findings:      T Score 0.3   Comments:       Assessment:  Normal.    Appended Document: Bone Density Normal    Clinical Lists Changes  Observations: Added new observation of BONEDENSRES: Normal (02/02/2009 16:40)

## 2010-02-14 NOTE — Progress Notes (Signed)
Summary: Office Visit//DEPRESSION SCREENING  Office Visit//DEPRESSION SCREENING   Imported By: Arta Bruce 03/23/2009 15:49:02  _____________________________________________________________________  External Attachment:    Type:   Image     Comment:   External Document

## 2010-02-14 NOTE — Progress Notes (Signed)
Summary: Urology referral  Phone Note Outgoing Call   Summary of Call: Needs referral to urology for overactive bladder. Initial call taken by: Tereso Newcomer PA-C,  May 15, 2009 4:15 PM

## 2010-02-14 NOTE — Letter (Signed)
Summary: MEDICATION ASSISTANCE PROGRAM  MEDICATION ASSISTANCE PROGRAM   Imported By: Arta Bruce 02/16/2009 15:04:46  _____________________________________________________________________  External Attachment:    Type:   Image     Comment:   External Document

## 2010-02-14 NOTE — Progress Notes (Signed)
Summary: BLADDER PRESSURE  Phone Note Other Incoming   Caller: PT Summary of Call: PT. CONTINUES TO COMPLAIN OF OVERACTIVE BLADDER WITH LOWER PELVIC PRESSURE. HAVING TO GET UP FREQUENTLY AT NIGHT. TAKING VESICARE WITH SOME RELIEF HOWEVER IT MAKES HER CONSTIPATED. TAKING METAMUCIL WITH SOME RELIEF. WANTS TO KNOW IF THERE IS ANYTHING THAT CAN BE DONE ABOUT PRESSURE SHE HAS. Initial call taken by: Gaylyn Cheers RN,  February 27, 2009 1:34 PM  Follow-up for Phone Call        Make sure she is drinking fluids but not too much (too much will make her go to the bathroom more). Avoid spicy foods, citrus and chocolate. Avoid caffeine, alcohol and sodas. Make sure she has and is doing Kegel exercises. We can discuss changing her meds at her f/u in 2 weeks. Follow-up by: Tereso Newcomer PA-C,  February 28, 2009 5:10 PM  Additional Follow-up for Phone Call Additional follow up Details #1::        Left message on answering machine for pt to call back.Marland KitchenMarland KitchenArmenia Shannon  March 01, 2009 11:59 AM  SPOKE WITH PT AND SHE IS AWARE OF THE EXERCISES AND WILL COME TO APPT ON MARCH 1 Additional Follow-up by: Armenia Shannon,  March 02, 2009 3:11 PM

## 2010-02-14 NOTE — Letter (Signed)
Summary: SUSIE'S SUMMARY  SUSIE'S SUMMARY   Imported By: Arta Bruce 01/16/2009 15:11:58  _____________________________________________________________________  External Attachment:    Type:   Image     Comment:   External Document

## 2010-02-14 NOTE — Letter (Signed)
Summary: External Correspondence  External Correspondence   Imported By: Paula Libra 01/31/2009 12:02:14  _____________________________________________________________________  External Attachment:    Type:   Image     Comment:   External Document  Appended Document: External Correspondence    Clinical Lists Changes  Observations: Added new observation of DIAB EYE EX: Retasure Normal (01/18/2009 16:55)

## 2010-02-14 NOTE — Letter (Signed)
Summary: *HSN Results Follow up  [facility]  [address] [place], [place] [address]   Phone: [phone]  Fax: [phone]      02/04/2009   AYARI LIWANAG Fanguy-MILLNER [address] [place], [place]  [address]   Dear  Ms. Kyren Ludwick-MILLNER,                            ____S.Drinkard,FNP   ____D. Gore,FNP       ____B. McPherson,MD   ____V. Rankins,MD    ____E. Mulberry,MD    ____N. Daphine Deutscher, FNP  ____D. Reche Dixon, MD    ____K. Philipp Deputy, MD    __x__S. Alben Spittle, PA-C     This letter is to inform you that your recent test(s):  _______Pap Smear    _______Lab Test     _______X-ray    _______ is within acceptable limits  _______ requires a medication change  _______ requires a follow-up lab visit  _______ requires a follow-up visit with your provider   Comments:  Bone Density Test was normal.  Eye test was normal.  Mammogram was normal.       _________________________________________________________ If you have any questions, please contact our office                     Sincerely,  Tereso Newcomer PA-C [facility]

## 2010-02-14 NOTE — Progress Notes (Signed)
Summary: VERY UNCOMFORTABLE AND MISERABLE  Phone Note Call from Patient Call back at Home Phone [phone]   Reason for Call: Referral Summary of Call: Deni Berti PT. [facility] M[place] CALLED TO SAY THAT SHE IS IN A LOT OF PAIN AND UNCOMFORTABLE, AND SHE WANTS TO KNOW WHAT CAN SHE DO UNTIL SHE GETS IN AT [facility] TO SEE THE UROLOGIST. SHE IS NOT ABLE TO GO TO SLEEP AT ALL UNLESS SHE GETS EXHAUSTED AND SHE IS VERY CONSTIPATED. Initial call taken by: Leodis Rains,  May 24, 2009 10:19 AM  Follow-up for Phone Call        spoke with pt and she says her bladder is aching to the point she can not sleep... pt says its just an aching pain... pt says she is not having problems urinating but is constapated... pt says she has been taking advil and it helped.... Follow-up by: [place] Shannon,  May 24, 2009 10:56 AM  Additional Follow-up for Phone Call Additional follow up Details #1::        She can try Miralax daily to help with constipation. She can try Pyridium as needed for bladder discomfort.  She can use up to three times a day but should only use as needed and try not to use it all the time.  Warn her that it will turn her urine orange. . . that is normal. Make sure she is drinking plenty of water and avoiding caffeine, spicy foods, etc. Rx on your desk to fax to her pharmacy. Additional Follow-up by: Tereso Newcomer PA-C,  May 24, 2009 3:29 PM    Additional Follow-up for Phone Call Additional follow up Details #2::    Left message on answering machine for pt to call back.Marland KitchenMarland KitchenMarland KitchenArmenia Shannon  May 24, 2009 3:58 P  Still having bladder and some constipation issues.  Stopped taking Vesicare 4 or more days ago and states it helped with bladder aching, along with just "sipping water".  Took Advil for 2-3 days but hasn't taken it x2 days.  Advised re Rx for Miralax and Pyridium, instructions for use given.  Rx faxed to [facility].  Also c/o blurry vision x several days; states difficult to read and concerned  that BP or other meds might be causing it, but occurs only when reading.  Has not had eye exam for 3 years.  Retasure done with no problems noted.  Will set referral for eye exam. Follow-up by: Dutch Quint RN,  May 28, 2009 9:27 AM  New/Updated Medications: PYRIDIUM 100 MG TABS (PHENAZOPYRIDINE HCL) Take 1 tablet by mouth up to  three times a day as needed for bladder pain MIRALAX  POWD (POLYETHYLENE GLYCOL 3350) 1 capful dissolved in 8 oz. glass of water and drink once daily for constipation Prescriptions: MIRALAX  POWD (POLYETHYLENE GLYCOL 3350) 1 capful dissolved in 8 oz. glass of water and drink once daily for constipation  #1 bottle x 2   Entered and Authorized by:   Tereso Newcomer PA-C   Signed by:   Tereso Newcomer PA-C on 05/24/2009   Method used:   Print then Give to Patient   RxID:   [id number] PYRIDIUM 100 MG TABS (PHENAZOPYRIDINE HCL) Take 1 tablet by mouth up to  three times a day as needed for bladder pain  #30 x 1   Entered and Authorized by:   Tereso Newcomer PA-C   Signed by:   Tereso Newcomer PA-C on 05/24/2009   Method used:   Print then Give to Patient  RxID:   [id number]   Appended Document: Orders Update    Clinical Lists Changes  Orders: Added new Referral order of Ophthalmology Referral (Ophthalmology) - Signed

## 2010-02-14 NOTE — Letter (Signed)
Summary: *HSN Results Follow up  [facility]  [address] [place], [place] [address]   Phone: [phone]  Fax: [phone]      02/28/2009   Laura [name] Sluder-MILLNER [address] [name], [place]  [address]   Dear  [name],                            ____S.Drinkard,FNP   ____D. Gore,FNP       ____B. McPherson,MD   ____V. Rankins,MD    ____E. Mulberry,MD    ____N. Daphine Deutscher, FNP  ____D. Reche Dixon, MD    ____K. Philipp Deputy, MD    __x__S. Alben Spittle, PA-C     This letter is to inform you that your recent test(s):  _______Pap Smear    ____x___Lab Test     _______X-ray    __x_____ is within acceptable limits  _______ requires a medication change  _______ requires a follow-up lab visit  _______ requires a follow-up visit with your provider   Comments:       _________________________________________________________ If you have any questions, please contact our office                     Sincerely,  Tereso Newcomer PA-C [facility]

## 2010-02-14 NOTE — Progress Notes (Signed)
  Phone Note Call from Patient Call back at Home Phone [phone]   Summary of Call: the pt wants either [place] or Tereso Newcomer to call her back in reference of a medication Norvasac 5mg  tab).  Pt states that the Health Dept never got the prescription.  Please call her back.  Initial call taken by: Manon Hilding,  March 20, 2009 10:56 AM  Follow-up for Phone Call        spoke with pt and she is aware of med Follow-up by: [place] Shannon,  March 20, 2009 3:22 PM

## 2010-02-14 NOTE — Miscellaneous (Signed)
Summary: Med change  Phone Note Refill Request   Summary of Call: notify pt that the health department can no longer get losartan (cozaar) This medication has been changed to benicar 20mg  by mouth daily  schedule nurse visit - 2 weeks after starting the new medication for a blood pressure check. instruct pt to take medications before this visit Initial call taken by: Lehman Prom FNP,  August 29, 2009 5:56 PM  Follow-up for Phone Call        Left message on answering machine for pt to call back.Marland KitchenMarland KitchenArmenia Shannon  August 30, 2009 10:11 AM   spoke with pt and she has insurance now... pt pharmacy is now [facility]... [place] Shannon  August 30, 2009 10:29 AM     New/Updated Medications: BENICAR 20 MG TABS (OLMESARTAN MEDOXOMIL) One tablet by mouth daily for blood pressure Clinical Lists Changes  Medications: Changed medication from COZAAR 100 MG TABS (LOSARTAN POTASSIUM) Take 1 tablet by mouth once a day for blood pressure to BENICAR 20 MG TABS (OLMESARTAN MEDOXOMIL) One tablet by mouth daily for blood pressure - Signed Rx of BENICAR 20 MG TABS (OLMESARTAN MEDOXOMIL) One tablet by mouth daily for blood pressure;  #30 x 5;  Signed;  Entered by: Lehman Prom FNP;  Authorized by: Lehman Prom FNP;  Method used: Printed then faxed to [facility], [address] [place], [place], [place]  [address], Ph: [phone], Fax: [phone] Observations: Added new observation of PHONE RX: Left message on answering machine for pt to call back.Marland KitchenMarland KitchenArmenia Shannon  August 30, 2009 10:11 AM   spoke with pt and she has insurance now... pt pharmacy is now [facility]... [place] Shannon  August 30, 2009 10:29 AM  (08/29/2009 17:53)    Prescriptions: BENICAR 20 MG TABS (OLMESARTAN MEDOXOMIL) One tablet by mouth daily for blood pressure  #30 x 5   Entered and Authorized by:   Lehman Prom FNP   Signed by:   Lehman Prom FNP on 08/29/2009   Method used:   Printed then  faxed to ...       [facility] Department (retail)       [address] [place], [place]  [address]       Ph: [phone]       Fax: [phone]   RxID:   [id number]

## 2010-02-14 NOTE — Progress Notes (Signed)
Summary: GI referral  Phone Note Call from Patient   Summary of Call: States she has severe constipation.  Unable to take stool softeners because they cause her to be incontinent of stools -- not diarrhea, but frequent soft stools.  She is using Fiber-con tablets, Metamucil, nothing helps.  States that she is taking plenty of fluids, stopped taking vesicare because the pain from her constipation was so great.  Has frequent unexpected flatulence that is concerning her as well.   Suggested Miralax as directed on her medication list -- states she has not tried Miralax.  Instructed as to use of medication for optimal results.   Wants to know if there is anything else she can take?  She is requesting a referral to a GI doctor to "find out what's going on."    Initial call taken by: Dutch Quint RN,  August 17, 2009 10:08 AM  Follow-up for Phone Call        Agree with miralax.  Send handout on high fiber diet.  When she had her CPP, did not know if Dr. Corinda Gubler was coming back.  Will put referral in to GI.  Send to Arna Medici after patient notified.  Arna Medici, she needs referral to Dr. Victorino Dike for screening colo.; constipation.  Follow-up by: Tereso Newcomer PA-C,  August 17, 2009 1:35 PM  Additional Follow-up for Phone Call Additional follow up Details #1::        Left message on answering machine for pt. to return call.  Dutch Quint RN  August 17, 2009 2:04 PM  Left message on answering machine for pt. to return call -- handout on high-fiber diet sent.  Dutch Quint RN  August 20, 2009 10:32 AM  Left message on answering machine for pt. to return call.  Dutch Quint RN  August 21, 2009 10:41 AM  Letter sent to pt.  Dutch Quint RN  August 22, 2009 9:26 AM  States that she now has insurance and is going to send a letter to [place] about possibly changing providers.  Has been very happy with our care. Additional Follow-up by: Dutch Quint RN,  August 27, 2009 12:11 PM  New Problems: CONSTIPATION  (ICD-564.00)   Additional Follow-up for Phone Call Additional follow up Details #2::    Sorry to see her leave. Have her call us if she desires to stay so we can facilitate GI referral. Tereso Newcomer PA-C  September 03, 2009 5:26 PM  States she did have a referral, but they told her a three-month wait. Advised of provider's response.  Will be happy to return here for care if anything changes re her insurance. Follow-up by: Dutch Quint RN,  September 05, 2009 10:00 AM  New Problems: CONSTIPATION (ICD-564.00)

## 2010-02-14 NOTE — Letter (Signed)
Summary: *HSN Results Follow up  [facility]  [address] [place], [place] [address]   Phone: [phone]  Fax: [phone]      02/14/2009   [name] [name] [address] [place], [place]  [address]   Dear  [name],                            ____S.Drinkard,FNP   ____D. Gore,FNP       ____B. McPherson,MD   ____V. Rankins,MD    ____E. Mulberry,MD    ____N. Daphine Deutscher, FNP  ____D. Reche Dixon, MD    ____K. Philipp Deputy, MD    __x__S. Alben Spittle, PA-C     This letter is to inform you that your recent test(s):  _______Pap Smear    ____x___Lab Test     _______X-ray    __x_____ is within acceptable limits  _______ requires a medication change  _______ requires a follow-up lab visit  _______ requires a follow-up visit with your provider   Comments:       _________________________________________________________ If you have any questions, please contact our office                     Sincerely,  Tereso Newcomer PA-C [facility]

## 2010-02-26 ENCOUNTER — Other Ambulatory Visit: Payer: Self-pay | Admitting: Internal Medicine

## 2010-02-26 DIAGNOSIS — Z780 Asymptomatic menopausal state: Secondary | ICD-10-CM

## 2010-02-26 DIAGNOSIS — Z1231 Encounter for screening mammogram for malignant neoplasm of breast: Secondary | ICD-10-CM

## 2010-03-13 ENCOUNTER — Ambulatory Visit
Admission: RE | Admit: 2010-03-13 | Discharge: 2010-03-13 | Disposition: A | Discharge status: Home / Self Care | Payer: Medicare HMO | Source: Ambulatory Visit | Attending: Internal Medicine | Admitting: Internal Medicine

## 2010-03-13 DIAGNOSIS — Z780 Asymptomatic menopausal state: Secondary | ICD-10-CM

## 2010-03-13 DIAGNOSIS — Z1231 Encounter for screening mammogram for malignant neoplasm of breast: Secondary | ICD-10-CM

## 2010-04-18 LAB — POCT URINALYSIS DIP (DEVICE)
Bilirubin Urine: NEGATIVE
Protein, ur: >=300 mg/dL — AB
Urobilinogen, UA: 0.2 mg/dL (ref 0.0–1.0)

## 2010-04-18 LAB — URINE CULTURE

## 2010-05-31 NOTE — Op Note (Signed)
[facility]  Patient:    Laura, [name] Visit Number: [id number] MRN: [id number]          Service Type: SUR Location: 3E 0347 01 Attending Physician:  Laqueta Jean Proc. Date: 09/03/00 Admit Date:  09/03/2000 Discharge Date: 09/04/2000                             Operative Report  ADDENDUM:  To previously dictated operative note.  Cystourethroscopy was accomplished and showed that there was no erosion into the urethra of the Sabre pubovaginal sling.  There was no trabeculation or cellules or no stone formation and no foreign body material.  Following this, the patient underwent incision of the Sabre sling, as dictated. Attending Physician:  Laqueta Jean DD:  11/02/00 TD:  11/03/00 Job: [id number]

## 2010-05-31 NOTE — Op Note (Signed)
[facility]  Patient:    Laura [name], Laura [name] Visit Number: [id number] MRN: [id number]          Service Type: SUR Location: 3E 0347 01 Attending Physician:  Laqueta Jean Proc. Date: 09/03/00 Adm. Date:  09/03/2000                             Operative Report  PREOPERATIVE DIAGNOSIS:  Urinary retention.  POSTOPERATIVE DIAGNOSIS:  Urinary retention.  OPERATION:  Cystourethroscopy and incision of pubovaginal sling (sabre).  SURGEON:  Sigmund I. Patsi Sears, M.D.  ANESTHESIA:  General (LMA).  PREPARATION:  After appropriate preanesthesia, the patient was brought to the operating room, placed on the operating table in the dorsal supine position where general LMA anesthesia was introduced.  She was then re-placed in the dorsal lithotomy position where the pubis was prepped with Betadine solution and draped in the usual fashion.  DESCRIPTION OF PROCEDURE:  Vicryl sutures from the urethral incision were incised and removed.  The anterior vaginal vault repair sutures were left in place.  The sabre sling was easily identified, and a right angle clamp was placed behind it, and it was cut.  The wound was irrigated with antibiotic irrigation and closed with running and interrupted 3-0 Vicryl suture.  The patient had B&O suppository, was given IV Toradol, awakened, and taken to the recovery room in good condition. Attending Physician:  Laqueta Jean DD:  09/03/00 TD:  09/04/00 Job: [id number]

## 2010-05-31 NOTE — Procedures (Signed)
NAMEMEGGAN, [name]                ACCOUNT NO.:  [id number]   MEDICAL RECORD NO.:  [id number]          PATIENT TYPE:  OUT   LOCATION:  SLEEP CENTER                 FACILITY:  [facility]   PHYSICIAN:  Clinton D. Maple [place], MD, FCCP, FACPDATE OF BIRTH:  03/23/44   DATE OF STUDY:  11/19/2005                              NOCTURNAL POLYSOMNOGRAM   INDICATIONS FOR STUDY:  Hypersomnia with sleep apnea.  Epworth sleepiness  score 5/24.   BMI 34.6, weight 203 pounds.   HOME MEDICATIONS:  Lorazepam, hydroxyzine, Advil.   A baseline diagnostic NPSG on 10/12/2002 recorded an AHI of 32 per hour.  CPAP titration on 11/13/2002 was to 7 CWP.  Blood study protocol was  requested.   SLEEP ARCHITECTURE:  Total sleep time 397 minutes with sleep deficiency 85%.  Stage 1 was 7%, stage 2 was 92%, stages 3 and 4 were absent.  REM was 1% of  total sleep time.  Sleep latency 26 minutes.  REM latency 435 minutes.  Awake after sleep onset 45 minutes.  Arousal index 27.  Patient took 0.5 mg  of lorazepam at 9:30 p.m.   RESPIRATORY DATA:  Apnea/hypopnea index (AHI, RDI) 31.9 obstructive events  per hour, indicating obstructive sleep apnea/hypopnea syndrome.  There were  123 obstructive apneas and 88 hypopneas.  Events were not positional,  although somewhat more common while supine.  REM AHI 120 per hour.  She did  not have enough early events to permit CPAP titration by split study  protocol on this study night.   OXYGEN DATA:  Moderate-to-loud snoring with oxygen desaturation to a nadir  of 85%.  Mean oxygen saturation through the study was 97% on room air.   CARDIAC DATA:  Normal sinus rhythm.   MOVEMENT/PARASOMNIA:  Occasional limb jerk, insignificant.  Bathroom x2.   IMPRESSION/RECOMMENDATIONS:  1. Moderate obstructive sleep apnea/hypopnea syndrome, AHI 31.9 per hour,      similar to her 2004 study.  Events were slightly more common while      supine but not strongly positional.  Snoring was  moderate-to-loud with      oxygen desaturation to 85%.  2. She did not have enough early events to permit use of CPAP titration by      split protocol on this study night.      Consider return for CPAP titration as appropriate.  3. Complaints include overactive bladder.  Bathroom x2 noted during this      study night.      Clinton D. Maple Hudson, MD, [facility], FACP  Diplomate, Biomedical engineer of Sleep Medicine  Electronically Signed     CDY/MEDQ  D:  11/22/2005 12:09:33  T:  11/22/2005 16:20:04  Job:  [id number]

## 2010-05-31 NOTE — Op Note (Signed)
[facility]  Patient:    Laura [name], Laura [name]                  MRN: [id number] Proc. Date: 08/13/00 Attending:  Vonzell Schlatter. Patsi Sears, M.D.                           Operative Report  PREOPERATIVE DIAGNOSIS:  Stress urinary incontinence with cystourethrocele.  POSTOPERATIVE DIAGNOSIS:  Stress urinary incontinence with cystourethrocele.  OPERATION:  Saber pubovaginal sling with anterior vaginal vault repair.  SURGEON:  Sigmund I. Patsi Sears, M.D.  PREPARATION:  After appropriate preanesthesia, the patient was brought to the operating room and placed on the operating table in the dorsal supine position where general LMA anesthesia was introduced.  She was re-placed in the dorsal lithotomy position where the pubis was prepped with Betadine and draped in the usual fashion.  DESCRIPTION OF PROCEDURE:  Inspection revealed that the patient had a grade 2-3 cystourethrocele, which was larger than appreciated in the office after anesthetic induction.  After marking the patient for pubovaginal sling, photodocumentation was accomplished, and a 1 cm incision was made midline at the level of the urethra and subcutaneous tissue dissected bilaterally.  The retropubic space was not violated.  Two separate 1 cm incisions were then made 1 fingerbreadth lateral to the midline on both sides of the pubis, and the Sabre needle was then placed retropubically.  Cystoscopy was accomplished and noticed that the Saber needle was in the bladder neck, and this was removed and re-placed.  Repeat cystoscopy showed the Saber again to be in the bladder neck area, and it was again re-placed, and cystoscopy was accomplished which showed excellent bladder neck placement following further dissection in the right periurethral space.  Antibiotic irrigation was accomplished in the vaginal area.  The sling was then brought in standard fashion retropubically with a right angle clamp behind  the midline, and the midline portion of the 1 cm wide sling was left with the "smooth side" toward the urethra and was left very loose.  Using finger pressure, the pelvic incisions were depressed and the sling tape cut.  These incisions were eventually closed with Benzoin and Steri-Strips.  The urethral incision was then closed in two layers with 3-0 Vicryl suture. Minimal bleeding was noted.  Attention was then directed to the anterior vaginal vault repair.  A 10 cm midline incision was then made, subcutaneous tissue dissected proximal-ward. A scar was identified where a previous anterior vault repair was accomplished when the patient had hysterectomy.  This made dissection somewhat difficult. A 3-0 PDS horizontal mattress suture was placed in thin, wispy, fibrous structures which were felt to be remnants of the cardinal ligament.  Following this, 3-0 Vicryl pop-off suture was used to create horizontal mattress Kelly plication sutures.  Following this, the incisional edges were freshened by excising a small amount of tissue, and the vaginal incision was closed with running 3-0 Vicryl suture.  Vaginal packing was placed, Foley catheter placed, and the patient was awakened after given IV Toradol and was taken to the recovery room in good condition.  It is noted that the patient had approximately 20 cc of 0.25% Marcaine with epinephrine injected into the vaginal space.  The patient tolerated the procedure well. DD:  08/13/00 TD:  08/13/00 Job: [id number]

## 2010-07-02 ENCOUNTER — Inpatient Hospital Stay (INDEPENDENT_AMBULATORY_CARE_PROVIDER_SITE_OTHER)
Admission: RE | Admit: 2010-07-02 | Discharge: 2010-07-02 | Disposition: A | Discharge status: Home / Self Care | Payer: Self-pay | Source: Ambulatory Visit

## 2010-07-02 DIAGNOSIS — N318 Other neuromuscular dysfunction of bladder: Secondary | ICD-10-CM

## 2010-07-02 LAB — POCT URINALYSIS DIP (DEVICE)
Bilirubin Urine: NEGATIVE
Glucose, UA: 250 mg/dL — AB
Ketones, ur: NEGATIVE mg/dL
Leukocytes, UA: NEGATIVE
Protein, ur: NEGATIVE mg/dL
Specific Gravity, Urine: 1.015 (ref 1.005–1.030)
Urobilinogen, UA: 1 mg/dL (ref 0.0–1.0)

## 2010-09-18 ENCOUNTER — Inpatient Hospital Stay (INDEPENDENT_AMBULATORY_CARE_PROVIDER_SITE_OTHER)
Admission: RE | Admit: 2010-09-18 | Discharge: 2010-09-18 | Disposition: A | Discharge status: Home / Self Care | Payer: Medicare Other | Source: Ambulatory Visit | Attending: Emergency Medicine | Admitting: Emergency Medicine

## 2010-09-18 DIAGNOSIS — I10 Essential (primary) hypertension: Secondary | ICD-10-CM

## 2010-09-18 DIAGNOSIS — R350 Frequency of micturition: Secondary | ICD-10-CM

## 2010-09-18 LAB — POCT URINALYSIS DIP (DEVICE)
Bilirubin Urine: NEGATIVE
Hgb urine dipstick: NEGATIVE
Ketones, ur: NEGATIVE mg/dL

## 2010-09-18 LAB — POCT I-STAT, CHEM 8
Chloride: 105 mEq/L (ref 96–112)
Creatinine, Ser: 0.7 mg/dL (ref 0.50–1.10)
Glucose, Bld: 150 mg/dL — ABNORMAL HIGH (ref 70–99)
HCT: 41 % (ref 36.0–46.0)
Hemoglobin: 13.9 g/dL (ref 12.0–15.0)

## 2010-10-09 LAB — URINALYSIS, ROUTINE W REFLEX MICROSCOPIC
Bilirubin Urine: NEGATIVE
Glucose, UA: NEGATIVE
Ketones, ur: NEGATIVE
Nitrite: NEGATIVE
Protein, ur: NEGATIVE
Specific Gravity, Urine: 1.007
Urobilinogen, UA: 0.2
pH: 6.5

## 2010-10-09 LAB — POCT I-STAT, CHEM 8
BUN: 10
Chloride: 108
Creatinine, Ser: 0.7
HCT: 38
TCO2: 23

## 2010-10-15 LAB — BASIC METABOLIC PANEL
GFR calc Af Amer: >60
GFR calc non Af Amer: >60
Potassium: 4.2
Sodium: 143

## 2010-10-15 LAB — URINALYSIS, ROUTINE W REFLEX MICROSCOPIC
Glucose, UA: NEGATIVE
Hgb urine dipstick: NEGATIVE
Ketones, ur: NEGATIVE
Protein, ur: NEGATIVE

## 2010-10-15 LAB — URINE CULTURE

## 2010-10-21 ENCOUNTER — Other Ambulatory Visit: Payer: Self-pay | Admitting: Internal Medicine

## 2010-10-21 ENCOUNTER — Other Ambulatory Visit (INDEPENDENT_AMBULATORY_CARE_PROVIDER_SITE_OTHER): Payer: Medicare Other

## 2010-10-21 ENCOUNTER — Ambulatory Visit (INDEPENDENT_AMBULATORY_CARE_PROVIDER_SITE_OTHER): Payer: Medicare Other | Admitting: Internal Medicine

## 2010-10-21 ENCOUNTER — Encounter: Payer: Self-pay | Admitting: Internal Medicine

## 2010-10-21 VITALS — BP 170/88 | HR 73 | Temp 97.8°F | Ht 64.0 in | Wt 198.0 lb

## 2010-10-21 DIAGNOSIS — Z0000 Encounter for general adult medical examination without abnormal findings: Secondary | ICD-10-CM

## 2010-10-21 DIAGNOSIS — F3289 Other specified depressive episodes: Secondary | ICD-10-CM

## 2010-10-21 DIAGNOSIS — I10 Essential (primary) hypertension: Secondary | ICD-10-CM

## 2010-10-21 DIAGNOSIS — R3989 Other symptoms and signs involving the genitourinary system: Secondary | ICD-10-CM

## 2010-10-21 DIAGNOSIS — E785 Hyperlipidemia, unspecified: Secondary | ICD-10-CM

## 2010-10-21 DIAGNOSIS — Z79899 Other long term (current) drug therapy: Secondary | ICD-10-CM

## 2010-10-21 DIAGNOSIS — E119 Type 2 diabetes mellitus without complications: Secondary | ICD-10-CM

## 2010-10-21 DIAGNOSIS — F329 Major depressive disorder, single episode, unspecified: Secondary | ICD-10-CM

## 2010-10-21 DIAGNOSIS — F411 Generalized anxiety disorder: Secondary | ICD-10-CM

## 2010-10-21 DIAGNOSIS — G4700 Insomnia, unspecified: Secondary | ICD-10-CM

## 2010-10-21 LAB — MICROALBUMIN / CREATININE URINE RATIO
Creatinine,U: 118.6 mg/dL
Microalb Creat Ratio: 0.8 mg/g (ref 0.0–30.0)
Microalb, Ur: 0.9 mg/dL (ref 0.0–1.9)

## 2010-10-21 LAB — CBC WITH DIFFERENTIAL/PLATELET
Basophils Absolute: 0 10*3/uL (ref 0.0–0.1)
Eosinophils Absolute: 0.2 10*3/uL (ref 0.0–0.7)
HCT: 39.3 % (ref 36.0–46.0)
Hemoglobin: 13.2 g/dL (ref 12.0–15.0)
Lymphocytes Relative: 33.4 % (ref 12.0–46.0)
Lymphs Abs: 2.6 10*3/uL (ref 0.7–4.0)
MCHC: 33.5 g/dL (ref 30.0–36.0)
Neutro Abs: 4.5 10*3/uL (ref 1.4–7.7)
Platelets: 272 10*3/uL (ref 150.0–400.0)
RDW: 13.7 % (ref 11.5–14.6)

## 2010-10-21 LAB — BASIC METABOLIC PANEL
CO2: 25 mEq/L (ref 19–32)
Calcium: 9.6 mg/dL (ref 8.4–10.5)
Chloride: 107 mEq/L (ref 96–112)
Glucose, Bld: 148 mg/dL — ABNORMAL HIGH (ref 70–99)
Potassium: 4.3 mEq/L (ref 3.5–5.1)
Sodium: 140 mEq/L (ref 135–145)

## 2010-10-21 LAB — HEPATIC FUNCTION PANEL
AST: 18 U/L (ref 0–37)
Alkaline Phosphatase: 71 U/L (ref 39–117)
Bilirubin, Direct: 0.1 mg/dL (ref 0.0–0.3)
Total Protein: 7.8 g/dL (ref 6.0–8.3)

## 2010-10-21 LAB — URINALYSIS, ROUTINE W REFLEX MICROSCOPIC
Hgb urine dipstick: NEGATIVE
Ketones, ur: NEGATIVE
Leukocytes, UA: NEGATIVE
Urine Glucose: NEGATIVE
Urobilinogen, UA: 0.2 (ref 0.0–1.0)

## 2010-10-21 LAB — LIPID PANEL
Cholesterol: 237 mg/dL — ABNORMAL HIGH (ref 0–200)
Total CHOL/HDL Ratio: 5
Triglycerides: 221 mg/dL — ABNORMAL HIGH (ref 0.0–149.0)

## 2010-10-21 LAB — HEMOGLOBIN A1C: Hgb A1c MFr Bld: 6.9 % — ABNORMAL HIGH (ref 4.6–6.5)

## 2010-10-21 LAB — TSH: TSH: 1.45 u[IU]/mL (ref 0.35–5.50)

## 2010-10-21 MED ORDER — OLMESARTAN MEDOXOMIL 40 MG PO TABS: 40.0000 mg | ORAL_TABLET | Freq: Every day | ORAL | Status: DC

## 2010-10-21 MED ORDER — LORAZEPAM 0.5 MG PO TABS: 0.5000 mg | ORAL_TABLET | Freq: Two times a day (BID) | ORAL | Status: DC | PRN

## 2010-10-21 MED ORDER — ATORVASTATIN CALCIUM 20 MG PO TABS: 20.0000 mg | ORAL_TABLET | Freq: Every day | ORAL | Status: DC

## 2010-10-21 NOTE — Assessment & Plan Note (Signed)
stable overall by hx and exam, and pt to continue medical treatment as before, for a1c

## 2010-10-21 NOTE — Assessment & Plan Note (Addendum)

## 2010-10-21 NOTE — Assessment & Plan Note (Signed)
Never did take the zoloft, and saw psychiatry who agreed no depression

## 2010-10-21 NOTE — Assessment & Plan Note (Signed)
Uncontrolled, to increase benicar to 40 mg,  to f/u any worsening symptoms or concerns

## 2010-10-21 NOTE — Assessment & Plan Note (Signed)
Benadryl not always work, but does not want ambien for now,  to f/u any worsening symptoms or concerns

## 2010-10-21 NOTE — Assessment & Plan Note (Addendum)
Chronic stable, for refill med of xanax, declines SSRI trial today

## 2010-10-21 NOTE — Patient Instructions (Addendum)
Continue all other medications as before You can also take the Azo and Benadryl at night as needed Please increase the Benicar to 40 mg per day Please go to LAB in the Basement for the blood and/or urine tests to be done today Please call the phone number [phone] (the PhoneTree System) for results of testing in 2-3 days;  When calling, simply dial the number, and when prompted enter the MRN number above (the Medical Record Number) and the # key, then the message should start. You will be contacted regarding the referral for: colonoscopy, and urology

## 2010-10-21 NOTE — Progress Notes (Signed)
Subjective:    Patient ID: Laura [name], female    DOB: January 12, 1946, 65 y.o.   MRN: [id number]  HPI  Here for wellness and f/u;  Overall doing ok;  Pt denies CP, worsening SOB, DOE, wheezing, orthopnea, PND, worsening LE edema, palpitations, dizziness or syncope.  Pt denies neurological change such as new Headache, facial or extremity weakness.  Pt denies polydipsia, polyuria, or low sugar symptoms. Pt states overall good compliance with treatment and medications, good tolerability, and trying to follow lower cholesterol diet.  Pt denies worsening depressive symptoms, suicidal ideation or panic. No fever, wt loss, night sweats, loss of appetite, or other constitutional symptoms.  Pt states good ability with ADL's, low fall risk, home safety reviewed and adequate, no significant changes in hearing or vision, and occasionally active with exercise.  Was seen for several yrs at [facility] aug 2011, then after at [facility], lost her job  March 2012, owed them money so cannot go back there.  Goes to [facility] 3-4 times per wk for exercise.  Has seen Dr Nile Riggs yrly for eye exam, but now changing to new optho due billing issue there, and glasses that did not seem to work.     Needs xanax refill - last one was aug 2011 for 60 which lasted the past yr.  Also cannot sleep at night with fatigue the next day.  Also with known mild "fallen bladder"  S/p bladder surg x 2, states she still has occasional "bladder ache" that keeps her from sleep;  Azo works ok, so stopped the OAB med.  Past Medical History  Diagnosis Date  . Diabetes mellitus   . Hyperlipidemia   . Hypertension   . UTI (lower urinary tract infection)    Past Surgical History  Procedure Date  . Bladder surgery     reports that she has never smoked. She does not have any smokeless tobacco history on file. She reports that she does not drink alcohol or use illicit drugs. family history includes Diabetes in her others and  Hypertension in her other. Allergies  Allergen Reactions  . Codeine     itch  . Prednisone     Nervous    . Sulfa Antibiotics     Tongue swells, hives, itching   No current outpatient prescriptions on file prior to visit.   Review of Systems Review of Systems  Constitutional: Negative for diaphoresis, activity change, appetite change and unexpected weight change.  HENT: Negative for hearing loss, ear pain, facial swelling, mouth sores and neck stiffness.   Eyes: Negative for pain, redness and visual disturbance.  Respiratory: Negative for shortness of breath and wheezing.   Cardiovascular: Negative for chest pain and palpitations.  Gastrointestinal: Negative for diarrhea, blood in stool, abdominal distention and rectal pain.  Genitourinary: Negative for hematuria, flank pain and decreased urine volume.  Musculoskeletal: Negative for myalgias and joint swelling.  Skin: Negative for color change and wound.  Neurological: Negative for syncope and numbness.  Hematological: Negative for adenopathy.  Psychiatric/Behavioral: Negative for hallucinations, self-injury, decreased concentration and agitation.      Objective:   Physical Exam BP 170/88  Pulse 73  Temp(Src) 97.8 F (36.6 C) (Oral)  Ht 5\' 4"  (1.626 m)  Wt 198 lb (89.812 kg)  BMI 33.99 kg/m2  SpO2 97% Physical Exam  VS noted Constitutional: Pt is oriented to person, place, and time. Appears well-developed and well-nourished.  HENT:  Head: Normocephalic and atraumatic.  Right Ear: External ear  normal.  Left Ear: External ear normal.  Nose: Nose normal.  Mouth/Throat: Oropharynx is clear and moist.  Eyes: Conjunctivae and EOM are normal. Pupils are equal, round, and reactive to light.  Neck: Normal range of motion. Neck supple. No JVD present. No tracheal deviation present.  Cardiovascular: Normal rate, regular rhythm, normal heart sounds and intact distal pulses.   Pulmonary/Chest: Effort normal and breath sounds  normal.  Abdominal: Soft. Bowel sounds are normal. There is no tenderness.  Musculoskeletal: Normal range of motion. Exhibits no edema.  Lymphadenopathy:  Has no cervical adenopathy.  Neurological: Pt is alert and oriented to person, place, and time. Pt has normal reflexes. No cranial nerve deficit.  Skin: Skin is warm and dry. No rash noted.  Psychiatric:  Has  normal mood and affect. Behavior is normal.     Assessment & Plan:

## 2010-10-21 NOTE — Assessment & Plan Note (Signed)
For UA today, and refer to urology, ok for azo qhs prn

## 2010-10-23 ENCOUNTER — Telehealth: Payer: Self-pay | Admitting: Internal Medicine

## 2010-10-23 NOTE — Telephone Encounter (Signed)
Message copied by Corwin Levins on Wed Oct 23, 2010  1:35 PM ------      Message from: Scharlene Gloss B      Created: Wed Oct 23, 2010  1:29 PM       Called the patient informed of results. She has had a severe reaction to both lipitor and crestor in the past. She does not want to start on any medication at this time, but if you could recommend vitamins. The patient did agree to having labs done in 4 weeks.

## 2010-10-30 LAB — I-STAT 8, (EC8 V) (CONVERTED LAB)
Acid-base deficit: 1
Operator id: 151321
Potassium: 4.1
Sodium: 140
TCO2: 26
pH, Ven: 7.374 — ABNORMAL HIGH

## 2010-10-30 LAB — DIFFERENTIAL
Basophils Absolute: 0
Basophils Relative: 0
Monocytes Absolute: 0.5
Neutro Abs: 5.7
Neutrophils Relative %: 61

## 2010-10-30 LAB — CBC
MCHC: 34.7
Platelets: 310
RDW: 13.8

## 2010-11-15 ENCOUNTER — Emergency Department (HOSPITAL_COMMUNITY)
Admission: EM | Admit: 2010-11-15 | Discharge: 2010-11-16 | Disposition: A | Discharge status: Home / Self Care | Payer: Medicare Other | Attending: Emergency Medicine | Admitting: Emergency Medicine

## 2010-11-15 DIAGNOSIS — IMO0001 Reserved for inherently not codable concepts without codable children: Secondary | ICD-10-CM | POA: Insufficient documentation

## 2010-11-15 DIAGNOSIS — N390 Urinary tract infection, site not specified: Secondary | ICD-10-CM | POA: Insufficient documentation

## 2010-11-15 DIAGNOSIS — R350 Frequency of micturition: Secondary | ICD-10-CM | POA: Insufficient documentation

## 2010-11-15 DIAGNOSIS — R6883 Chills (without fever): Secondary | ICD-10-CM | POA: Insufficient documentation

## 2010-11-15 DIAGNOSIS — E669 Obesity, unspecified: Secondary | ICD-10-CM | POA: Insufficient documentation

## 2010-11-15 DIAGNOSIS — E119 Type 2 diabetes mellitus without complications: Secondary | ICD-10-CM | POA: Insufficient documentation

## 2010-11-15 DIAGNOSIS — Z79899 Other long term (current) drug therapy: Secondary | ICD-10-CM | POA: Insufficient documentation

## 2010-11-15 LAB — URINALYSIS, ROUTINE W REFLEX MICROSCOPIC
Glucose, UA: NEGATIVE mg/dL
Protein, ur: NEGATIVE mg/dL
Urobilinogen, UA: 0.2 mg/dL (ref 0.0–1.0)

## 2010-11-15 LAB — URINE MICROSCOPIC-ADD ON

## 2010-11-16 LAB — BASIC METABOLIC PANEL
BUN: 13 mg/dL (ref 6–23)
CO2: 21 mEq/L (ref 19–32)
Chloride: 102 mEq/L (ref 96–112)
Creatinine, Ser: 0.77 mg/dL (ref 0.50–1.10)
GFR calc Af Amer: >90 mL/min (ref >90)
Potassium: 3.9 mEq/L (ref 3.5–5.1)

## 2010-11-16 LAB — CBC
HCT: 34.1 % — ABNORMAL LOW (ref 36.0–46.0)
MCV: 84.8 fL (ref 78.0–100.0)
RBC: 4.02 MIL/uL (ref 3.87–5.11)
RDW: 13.3 % (ref 11.5–15.5)
WBC: 15.1 10*3/uL — ABNORMAL HIGH (ref 4.0–10.5)

## 2010-11-16 LAB — DIFFERENTIAL
Basophils Absolute: 0 10*3/uL (ref 0.0–0.1)
Eosinophils Relative: 0 % (ref 0–5)
Lymphocytes Relative: 11 % — ABNORMAL LOW (ref 12–46)
Lymphs Abs: 1.7 10*3/uL (ref 0.7–4.0)
Neutro Abs: 12.3 10*3/uL — ABNORMAL HIGH (ref 1.7–7.7)
Neutrophils Relative %: 82 % — ABNORMAL HIGH (ref 43–77)

## 2010-12-14 DIAGNOSIS — R42 Dizziness and giddiness: Secondary | ICD-10-CM

## 2010-12-14 HISTORY — DX: Dizziness and giddiness: R42

## 2010-12-18 ENCOUNTER — Telehealth: Payer: Self-pay | Admitting: *Deleted

## 2010-12-18 MED ORDER — BENZONATATE 100 MG PO CAPS: ORAL_CAPSULE | ORAL | Status: DC

## 2010-12-18 NOTE — Telephone Encounter (Signed)
Pt c/o "persistant cough" x1 week [has tried Coricidin & Delsym]; requesting Rx to [facility].

## 2010-12-18 NOTE — Telephone Encounter (Signed)
Ok for [employer] prn cough, could also consider allegra OTC daily if has any sinus or allergy symtpoms

## 2010-12-19 NOTE — Telephone Encounter (Signed)
Called left message to call back 

## 2010-12-20 NOTE — Telephone Encounter (Signed)
Called the patient informed. 

## 2011-01-01 ENCOUNTER — Encounter: Payer: Self-pay | Admitting: Gastroenterology

## 2011-01-02 ENCOUNTER — Telehealth: Payer: Self-pay | Admitting: *Deleted

## 2011-01-02 MED ORDER — HYDROCODONE-HOMATROPINE 5-1.5 MG/5ML PO SYRP: 5.0000 mL | ORAL_SOLUTION | Freq: Four times a day (QID) | ORAL | Status: DC | PRN

## 2011-01-02 NOTE — Telephone Encounter (Signed)
Done hardcopy to robin  

## 2011-01-02 NOTE — Telephone Encounter (Signed)
Faxed hardcopy to [facility] per pt. Request.

## 2011-01-02 NOTE — Telephone Encounter (Signed)
Pt called regarding recent rx for Tessalon called in. She states that medication caused severe diarrhea. Pt is requesting another rx for cough and congestion sxs since she has had cough for about a month-wants rx sent to [facility].

## 2011-01-04 ENCOUNTER — Emergency Department (HOSPITAL_COMMUNITY): Payer: Medicare Other

## 2011-01-04 ENCOUNTER — Other Ambulatory Visit: Payer: Self-pay

## 2011-01-04 ENCOUNTER — Inpatient Hospital Stay (HOSPITAL_COMMUNITY)
Admission: EM | Admit: 2011-01-04 | Discharge: 2011-01-07 | DRG: 149 | Disposition: A | Discharge status: Home / Self Care | Payer: Medicare Other | Attending: Internal Medicine | Admitting: Internal Medicine

## 2011-01-04 ENCOUNTER — Encounter (HOSPITAL_COMMUNITY): Payer: Self-pay

## 2011-01-04 DIAGNOSIS — G4700 Insomnia, unspecified: Secondary | ICD-10-CM

## 2011-01-04 DIAGNOSIS — H5509 Other forms of nystagmus: Secondary | ICD-10-CM | POA: Diagnosis present

## 2011-01-04 DIAGNOSIS — R319 Hematuria, unspecified: Secondary | ICD-10-CM

## 2011-01-04 DIAGNOSIS — F329 Major depressive disorder, single episode, unspecified: Secondary | ICD-10-CM

## 2011-01-04 DIAGNOSIS — E119 Type 2 diabetes mellitus without complications: Secondary | ICD-10-CM | POA: Diagnosis present

## 2011-01-04 DIAGNOSIS — E559 Vitamin D deficiency, unspecified: Secondary | ICD-10-CM

## 2011-01-04 DIAGNOSIS — I10 Essential (primary) hypertension: Secondary | ICD-10-CM | POA: Diagnosis present

## 2011-01-04 DIAGNOSIS — R0602 Shortness of breath: Secondary | ICD-10-CM

## 2011-01-04 DIAGNOSIS — Z0000 Encounter for general adult medical examination without abnormal findings: Secondary | ICD-10-CM

## 2011-01-04 DIAGNOSIS — M1990 Unspecified osteoarthritis, unspecified site: Secondary | ICD-10-CM

## 2011-01-04 DIAGNOSIS — G4730 Sleep apnea, unspecified: Secondary | ICD-10-CM

## 2011-01-04 DIAGNOSIS — H9190 Unspecified hearing loss, unspecified ear: Secondary | ICD-10-CM | POA: Diagnosis present

## 2011-01-04 DIAGNOSIS — J069 Acute upper respiratory infection, unspecified: Secondary | ICD-10-CM | POA: Diagnosis present

## 2011-01-04 DIAGNOSIS — F411 Generalized anxiety disorder: Secondary | ICD-10-CM

## 2011-01-04 DIAGNOSIS — R011 Cardiac murmur, unspecified: Secondary | ICD-10-CM

## 2011-01-04 DIAGNOSIS — E785 Hyperlipidemia, unspecified: Secondary | ICD-10-CM | POA: Diagnosis present

## 2011-01-04 DIAGNOSIS — H8120 Vestibular neuronitis, unspecified ear: Principal | ICD-10-CM | POA: Diagnosis present

## 2011-01-04 DIAGNOSIS — R82998 Other abnormal findings in urine: Secondary | ICD-10-CM

## 2011-01-04 DIAGNOSIS — K5900 Constipation, unspecified: Secondary | ICD-10-CM

## 2011-01-04 DIAGNOSIS — R3989 Other symptoms and signs involving the genitourinary system: Secondary | ICD-10-CM

## 2011-01-04 DIAGNOSIS — N318 Other neuromuscular dysfunction of bladder: Secondary | ICD-10-CM

## 2011-01-04 DIAGNOSIS — R42 Dizziness and giddiness: Secondary | ICD-10-CM | POA: Diagnosis present

## 2011-01-04 DIAGNOSIS — K589 Irritable bowel syndrome without diarrhea: Secondary | ICD-10-CM

## 2011-01-04 DIAGNOSIS — H5500 Unspecified nystagmus: Secondary | ICD-10-CM | POA: Diagnosis present

## 2011-01-04 HISTORY — DX: Anxiety disorder, unspecified: F41.9

## 2011-01-04 LAB — CBC
HCT: 35.1 % — ABNORMAL LOW (ref 36.0–46.0)
Hemoglobin: 11.9 g/dL — ABNORMAL LOW (ref 12.0–15.0)
MCH: 28.7 pg (ref 26.0–34.0)
MCHC: 33.9 g/dL (ref 30.0–36.0)
MCV: 84.6 fL (ref 78.0–100.0)
Platelets: 234 10*3/uL (ref 150–400)
RBC: 4.15 MIL/uL (ref 3.87–5.11)
RDW: 13.7 % (ref 11.5–15.5)
WBC: 7.8 10*3/uL (ref 4.0–10.5)

## 2011-01-04 LAB — BASIC METABOLIC PANEL
BUN: 10 mg/dL (ref 6–23)
Chloride: 102 mEq/L (ref 96–112)
Creatinine, Ser: 0.66 mg/dL (ref 0.50–1.10)
Glucose, Bld: 204 mg/dL — ABNORMAL HIGH (ref 70–99)
Potassium: 4.2 mEq/L (ref 3.5–5.1)

## 2011-01-04 LAB — TROPONIN I: Troponin I: <0.3 ng/mL (ref <0.30)

## 2011-01-04 MED ORDER — ONDANSETRON HCL 4 MG/2ML IJ SOLN: 4.0000 mg | Freq: Three times a day (TID) | INTRAMUSCULAR | Status: DC | PRN

## 2011-01-04 MED ORDER — ONDANSETRON HCL 4 MG/2ML IJ SOLN
INTRAMUSCULAR | Status: AC
  Administered 2011-01-04: 8 mg via INTRAVENOUS
  Filled 2011-01-04: qty 4

## 2011-01-04 MED ORDER — SODIUM CHLORIDE 0.9 % IV SOLN
1000.0000 mL | Freq: Once | INTRAVENOUS | Status: AC
  Administered 2011-01-04: 1000 mL via INTRAVENOUS

## 2011-01-04 MED ORDER — ONDANSETRON 8 MG/NS 50 ML IVPB: 8.0000 mg | Freq: Once | INTRAVENOUS | Status: DC

## 2011-01-04 MED ORDER — LORAZEPAM 2 MG/ML IJ SOLN
1.0000 mg | Freq: Once | INTRAMUSCULAR | Status: AC
  Administered 2011-01-04: 1 mg via INTRAVENOUS
  Filled 2011-01-04: qty 1

## 2011-01-04 MED ORDER — PROMETHAZINE HCL 25 MG/ML IJ SOLN
25.0000 mg | Freq: Once | INTRAMUSCULAR | Status: AC
  Administered 2011-01-04: 25 mg via INTRAVENOUS
  Filled 2011-01-04: qty 1

## 2011-01-04 MED ORDER — ONDANSETRON HCL 4 MG/2ML IJ SOLN
4.0000 mg | INTRAMUSCULAR | Status: AC
  Administered 2011-01-04: 8 mg via INTRAVENOUS

## 2011-01-04 MED ORDER — MECLIZINE HCL 25 MG PO TABS
50.0000 mg | ORAL_TABLET | Freq: Once | ORAL | Status: AC
  Administered 2011-01-04: 50 mg via ORAL
  Filled 2011-01-04 (×2): qty 1

## 2011-01-04 NOTE — ED Notes (Signed)
[id number]<AV> Expected date:01/04/11<BR> Expected time:11:49 AM<BR> Means of arrival:Ambulance<BR> Comments:<BR> GC M11. 65 yo f. Fever, sick, n/v. Started today. 15 min eta

## 2011-01-04 NOTE — ED Provider Notes (Signed)
History     CSN: [id number]  Arrival date & time 01/04/11  1154   First MD Initiated Contact with Patient 01/04/11 1222      Chief Complaint  Patient presents with  . Nausea  . Dizziness  . Emesis   HPI Pt is a 65 yo F with sudden onset vertigo beginning this morning.  Pt reports the, for the last several days, she has been having the onset of some URI symptoms including cough, some congestion, and a little bit of sinus pressure.  When she awoke today she had immediate onset of vertigo.  She sat up and remained sitting for several minutes and the sensation subsided.  She then got up and got dressed and the sensation returned.  It did not get better this time and was associated with significant nausea plus several episodes of emesis.  She called EMS at this point.  Currently, patient is having significant nausea, has had emesis since arrival.  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  Patient has had a cough for the last 4 weeks but has no hemoptysis and the cough is only mildly productive of clear sputum.  Past Medical History  Diagnosis Date  . Diabetes mellitus   . Hyperlipidemia   . Hypertension   . UTI (lower urinary tract infection)   . Anxiety     Past Surgical History  Procedure Date  . Bladder surgery     Family History  Problem Relation Age of Onset  . Hypertension Other   . Diabetes Other   . Diabetes Other     History  Substance Use Topics  . Smoking status: Never Smoker   . Smokeless tobacco: Not on file  . Alcohol Use: No    OB History    Grav Para Term Preterm Abortions TAB SAB Ect Mult Living                  Review of Systems  Constitutional: Negative.   HENT: Positive for congestion and sinus pressure. Negative for sore throat, facial swelling, sneezing and neck pain.   Eyes: Negative.   Respiratory: Positive for cough. Negative for chest  tightness and wheezing.   Cardiovascular: Negative.   Gastrointestinal: Positive for nausea and vomiting. Negative for abdominal pain, diarrhea and constipation.  Genitourinary: Negative.   Musculoskeletal: Negative.   Skin: Negative.   Neurological: Positive for dizziness. Negative for seizures, syncope, speech difficulty, weakness, light-headedness and headaches.  Hematological: Negative.   Psychiatric/Behavioral: Negative.     Allergies  Codeine; Crestor; Lipitor; Prednisone; and Sulfa antibiotics  Home Medications   Current Outpatient Rx  Name Route Sig Dispense Refill  . BENZONATATE 100 MG PO CAPS Oral Take 100-200 mg by mouth 3 (three) times daily as needed. For cough.     . CHLORPHEN-PSEUDOEPHED-APAP 2-30-325 MG PO TABS Oral Take 1 tablet by mouth as needed. For cold symptoms.     Marland Kitchen VITAMIN D 1000 UNITS PO TABS Oral Take 1,000 Units by mouth daily.      Marland Kitchen DIPHENHYDRAMINE HCL 25 MG PO TABS Oral Take 25 mg by mouth daily as needed. For allergies.     Marland Kitchen HYDROCODONE-HOMATROPINE 5-1.5 MG/5ML PO SYRP Oral Take 5 mLs by mouth every 6 (six) hours as needed. For cough.     Marland Kitchen HYDROCORTISONE 1 % EX CREA Topical Apply 1 application topically as needed. For itchy spot  on bottom of foot.     . IBUPROFEN 200 MG PO TABS Oral Take 200 mg by mouth every 6 (six) hours as needed. For pain.     Marland Kitchen LORAZEPAM 0.5 MG PO TABS Oral Take 0.5 mg by mouth 2 (two) times daily as needed. For anxiety or insomnia.     Marland Kitchen OLMESARTAN MEDOXOMIL 40 MG PO TABS Oral Take 1 tablet (40 mg total) by mouth daily. 90 tablet 3  . OMEGA 3 1000 MG PO CAPS Oral Take 1,000 mg by mouth daily.     . ATORVASTATIN CALCIUM 20 MG PO TABS Oral Take 1 tablet (20 mg total) by mouth daily. 90 tablet 3  . B-12 100 MCG PO TABS Oral Take by mouth daily.     Marland Kitchen GARLIC PO Oral Take by mouth daily.        BP 201/101  Pulse 72  Temp(Src) 97.8 F (36.6 C) (Oral)  Resp 20  SpO2 99%  Physical Exam  Constitutional: She is oriented to  person, place, and time.       Obese female in obvious distress.  Intermittently vomiting, keeping eyes closed  HENT:  Head: Normocephalic and atraumatic.  Right Ear: External ear normal.  Left Ear: External ear normal.  Nose: Nose normal.  Mouth/Throat: Oropharynx is clear and moist.       RTM normal, LTM has small amount of clear fluid behind it.  Eyes: Conjunctivae and EOM are normal. Pupils are equal, round, and reactive to light.       Horizontal nystagmus  Neck: Normal range of motion. Neck supple.  Cardiovascular: Normal rate, regular rhythm and normal heart sounds.   Pulmonary/Chest: Effort normal and breath sounds normal. She has no wheezes.  Abdominal: Soft. Bowel sounds are normal. She exhibits no distension. There is no tenderness.  Musculoskeletal: Normal range of motion. She exhibits no edema and no tenderness.  Neurological: She is alert and oriented to person, place, and time. She displays normal reflexes. No cranial nerve deficit. She exhibits normal muscle tone. Coordination normal.  Skin: Skin is warm and dry.  Psychiatric: She has a normal mood and affect.    ED Course  Procedures (including critical care time)  Labs Reviewed  CBC - Abnormal; Notable for the following:    Hemoglobin 11.9 (*)    HCT 35.1 (*)    All other components within normal limits  BASIC METABOLIC PANEL - Abnormal; Notable for the following:    Glucose, Bld 204 (*)    All other components within normal limits   No results found.   No diagnosis found.    MDM  Pt has vertigo that is likely peripheral in nature but has not responded to initial therapy.  Pt still unable to walk due to vertigo, although she is less nauseous.  Will obtain MRI to confirm that this does not represent stroke and, if symptoms not significantly better at that time, admit for symptom management.        Majel Homer, MD 01/04/11 [phone]

## 2011-01-04 NOTE — ED Provider Notes (Signed)
The patient's arise without evidence of acute stroke.  I didn't ambulate the patient about him and she is still very unsteady on her feet it is a significant fall risk.  She lives at home by herself patient will be admitted overnight for observation and ongoing symptomatic treatment.  She does report improvement in her nausea.  1. Vertigo     Mr Brain Wo Contrast  01/04/2011  *RADIOLOGY REPORT*  Clinical Data: Vertigo.  Dizziness  MRI HEAD WITHOUT CONTRAST  Technique:  Multiplanar, multiecho pulse sequences of the brain and surrounding structures were obtained according to standard protocol without intravenous contrast.  Comparison: CT 07/13/2006  Findings: Negative for acute infarct.  Small hyperintensity left parietal white matter, likely due to chronic ischemia.  Brainstem is intact.  Ventricle size is normal.  Negative for mass lesion.  No hemorrhage or fluid collection is identified.  Mild mucosal edema in the paranasal sinuses.  Mild mucosal edema in the mastoid sinus bilaterally.  IMPRESSION: Mild chronic microvascular ischemia left parietal white matter.  No acute infarct.  Mild chronic sinusitis.  Original Report Authenticated By: Camelia Phenes, M.D.     Lyanne Co, MD 01/04/11 [phone]

## 2011-01-04 NOTE — ED Notes (Signed)
Bed assigned, tried to call report rn jennifer is not ready to take report

## 2011-01-04 NOTE — ED Notes (Signed)
MRI called, sts pt is claustrophobic and needs medicine.

## 2011-01-04 NOTE — ED Provider Notes (Signed)
Patient presents with complaints of vertigo, nausea and vomiting. She feels somewhat that her coordination is off. Physical Exam  BP 201/101  Pulse 72  Temp(Src) 97.8 F (36.6 C) (Oral)  Resp 20  SpO2 99%  Physical Exam  Constitutional: She is oriented to person, place, and time.  Neurological: She is alert and oriented to person, place, and time. No cranial nerve deficit.       5 out of 5 grip strength bilaterally,    ED Course  Procedures  MDM Symptoms suggestive of peripheral vertigo with the motion component however pt does mention some coordination issues.  Will treat and reassess.  Pt does not feel she can ambulate at this time.  Pt unable to comply with more thorough neuro exam but will need to have that re-evaluated after some antiemetics.      Celene Kras, MD 01/04/11 [phone]

## 2011-01-04 NOTE — ED Notes (Signed)
Pt in bed, alert and oriented x 3, no resp discomfort, denies pain, denies nausea, no vomiting, pending bed assignment

## 2011-01-04 NOTE — ED Notes (Signed)
Per ems pt from home, here for nausea, vomiting, and dizziness. Pt also has cough x 4 weeks, and had been treated for UTI 2 weeks ago.

## 2011-01-04 NOTE — ED Notes (Signed)
Pt transported to MRI 

## 2011-01-04 NOTE — H&P (Signed)
PCP:   Oliver Barre, MD, MD   Chief Complaint:  Dizziness, room spinning.  HPI: Patient is a pleasant 64 year old black woman with a history of hypertension, type 2 diabetes, hyperlipidemia who presents to the hospital today with the above-mentioned complaints. For about 3-4 days she has been battling what appears to be a viral upper respiratory infection and has had some cough, runny nose and some ear pain. Today she woke up at about 10:00 in the morning and immediately noticed that she was very dizzy and had the sensation of the room spinning around her. She states that the room always spins to the right side. She called EMS. In the emergency department she has been given several doses of Ativan, anti-emetics, and meclizine without relief. She is not able to ambulate. She has unprovoked right-sided horizontal nystagmus. Because patient feels like she is unable to control her symptoms or ambulate at home, we have been asked to admit her for further evaluation and management. An MRI has been done in the emergency department that does not show any evidence for a posterior circulation CVA.  Allergies:   Allergies  Allergen Reactions  . Codeine     itch  . Crestor (Rosuvastatin Calcium)   . Lipitor (Atorvastatin Calcium)   . Prednisone     Nervous    . Sulfa Antibiotics     Tongue swells, hives, itching      Past Medical History  Diagnosis Date  . Diabetes mellitus   . Hyperlipidemia   . Hypertension   . UTI (lower urinary tract infection)   . Anxiety     Past Surgical History  Procedure Date  . Bladder surgery     Prior to Admission medications   Medication Sig Start Date End Date Taking? Authorizing Provider  benzonatate (TESSALON) 100 MG capsule Take 100-200 mg by mouth 3 (three) times daily as needed. For cough.    Yes Historical Provider, MD  Chlorphen-Pseudoephed-APAP (CORICIDIN D) 2-30-325 MG TABS Take 1 tablet by mouth as needed. For cold symptoms.    Yes Historical  Provider, MD  cholecalciferol (VITAMIN D) 1000 UNITS tablet Take 1,000 Units by mouth daily.     Yes Historical Provider, MD  diphenhydrAMINE (BENADRYL) 25 MG tablet Take 25 mg by mouth daily as needed. For allergies.    Yes Historical Provider, MD  HYDROcodone-homatropine (HYCODAN) 5-1.5 MG/5ML syrup Take 5 mLs by mouth every 6 (six) hours as needed. For cough.    Yes Oliver Barre, MD  hydrocortisone cream 1 % Apply 1 application topically as needed. For itchy spot on bottom of foot.    Yes Historical Provider, MD  ibuprofen (ADVIL) 200 MG tablet Take 200 mg by mouth every 6 (six) hours as needed. For pain.    Yes Historical Provider, MD  LORazepam (ATIVAN) 0.5 MG tablet Take 0.5 mg by mouth 2 (two) times daily as needed. For anxiety or insomnia.  10/21/10  Yes Oliver Barre, MD  olmesartan (BENICAR) 40 MG tablet Take 1 tablet (40 mg total) by mouth daily. 10/21/10 10/21/11 Yes Oliver Barre, MD  OMEGA 3 1000 MG CAPS Take 1,000 mg by mouth daily.    Yes Historical Provider, MD  atorvastatin (LIPITOR) 20 MG tablet Take 1 tablet (20 mg total) by mouth daily. 10/21/10 10/21/11  Oliver Barre, MD  Cyanocobalamin (B-12) 100 MCG TABS Take by mouth daily.     Historical Provider, MD  GARLIC PO Take by mouth daily.      Historical Provider, MD  Social History:  reports that she has never smoked. She does not have any smokeless tobacco history on file. She reports that she does not drink alcohol or use illicit drugs.  Family History  Problem Relation Age of Onset  . Hypertension Other   . Diabetes Other   . Diabetes Other     Review of Systems:  Negative except as mentioned in history of present illness.  Physical Exam: Blood pressure 145/63, pulse 84, temperature 97.9 F (36.6 C), temperature source Oral, resp. rate 16, SpO2 98.00%. General: Alert, awake, oriented x3 in moderate distress secondary to nausea and dizziness. HEENT: Normocephalic, atraumatic, pupils equal round and reactive to light, she has  unprovoked right-sided horizontal nystagmus, somewhat dry mucous membranes. Neck: Supple, no JVD, no lymphadenopathy, no bruits, no goiter. Cardiovascular: Regular rate and rhythm, no murmurs, rubs or gallops. Lungs: Clear to auscultation bilaterally. Abdomen: Soft, nontender, nondistended, positive bowel sounds, no masses organomegaly noted. Extremities: No clubbing, cyanosis or edema, positive pedal pulses. Neurologic: Unable to fully evaluate given her significant vertigo and nausea.  Labs on Admission:  Results for orders placed during the hospital encounter of 01/04/11 (from the past 48 hour(s))  CBC     Status: Abnormal   Collection Time   01/04/11  2:49 PM      Component Value Range Comment   WBC 7.8  4.0 - 10.5 (K/uL)    RBC 4.15  3.87 - 5.11 (MIL/uL)    Hemoglobin 11.9 (*) 12.0 - 15.0 (g/dL)    HCT 16.1 (*) 09.6 - 46.0 (%)    MCV 84.6  78.0 - 100.0 (fL)    MCH 28.7  26.0 - 34.0 (pg)    MCHC 33.9  30.0 - 36.0 (g/dL)    RDW 04.5  40.9 - 81.1 (%)    Platelets 234  150 - 400 (K/uL)   BASIC METABOLIC PANEL     Status: Abnormal   Collection Time   01/04/11  2:49 PM      Component Value Range Comment   Sodium 137  135 - 145 (mEq/L)    Potassium 4.2  3.5 - 5.1 (mEq/L) MODERATE HEMOLYSIS   Chloride 102  96 - 112 (mEq/L)    CO2 25  19 - 32 (mEq/L)    Glucose, Bld 204 (*) 70 - 99 (mg/dL)    BUN 10  6 - 23 (mg/dL)    Creatinine, Ser 9.14  0.50 - 1.10 (mg/dL)    Calcium 9.5  8.4 - 10.5 (mg/dL)    GFR calc non Af Amer >90  >90 (mL/min)    GFR calc Af Amer >90  >90 (mL/min)     Radiological Exams on Admission: Mr Brain Wo Contrast  01/04/2011  *RADIOLOGY REPORT*  Clinical Data: Vertigo.  Dizziness  MRI HEAD WITHOUT CONTRAST  Technique:  Multiplanar, multiecho pulse sequences of the brain and surrounding structures were obtained according to standard protocol without intravenous contrast.  Comparison: CT 07/13/2006  Findings: Negative for acute infarct.  Small hyperintensity left  parietal white matter, likely due to chronic ischemia.  Brainstem is intact.  Ventricle size is normal.  Negative for mass lesion.  No hemorrhage or fluid collection is identified.  Mild mucosal edema in the paranasal sinuses.  Mild mucosal edema in the mastoid sinus bilaterally.  IMPRESSION: Mild chronic microvascular ischemia left parietal white matter.  No acute infarct.  Mild chronic sinusitis.  Original Report Authenticated By: Camelia Phenes, M.D.    Assessment/Plan Principal Problem:  *Vertigo  Active Problems:  Nystagmus  DIABETES MELLITUS, TYPE II  HYPERLIPIDEMIA  Essential hypertension, benign   #1 vertigo, nystagmus: Suspect a peripheral source, especially vestibular neuritis given her recent upper respiratory viral infection. Review of literature indicates that supportive treatment is indicated. This should improve after one to 2 days. If symptoms are prolonged and corticosteroids may be helpful. Literature also indicates that vestibular exercises are helpful so will ask PT to assist with vestibular training.   Time Spent on Admission: 40 minutes  HERNANDEZ ACOSTA,Carlisle Torgeson [facility] Pager: [phone] 01/04/2011, 8:12 PM

## 2011-01-04 NOTE — ED Notes (Signed)
Pt remains at MRI

## 2011-01-04 NOTE — ED Provider Notes (Signed)
Medical screening examination/treatment/procedure(s) were performed by non-physician practitioner and as supervising physician I was immediately available for consultation/collaboration.   Delfino Friesen R Krystl Wickware, MD 01/04/11 1642 

## 2011-01-04 NOTE — ED Notes (Signed)
Pt woke up at 0800 with vertigo, room spinning, went back to sleep and woke up around 1000 still feeling dizzy, and with nausea and vomiting x 3, small amount, no blood noted. Denied abd pain.

## 2011-01-05 ENCOUNTER — Encounter (HOSPITAL_COMMUNITY): Payer: Self-pay

## 2011-01-05 LAB — GLUCOSE, CAPILLARY
Glucose-Capillary: 144 mg/dL — ABNORMAL HIGH (ref 70–99)
Glucose-Capillary: 134 mg/dL — ABNORMAL HIGH (ref 70–99)

## 2011-01-05 LAB — CBC
MCH: 28.4 pg (ref 26.0–34.0)
MCHC: 33.2 g/dL (ref 30.0–36.0)
MCV: 85.4 fL (ref 78.0–100.0)
Platelets: 250 10*3/uL (ref 150–400)
RBC: 4.12 MIL/uL (ref 3.87–5.11)
RDW: 13.7 % (ref 11.5–15.5)

## 2011-01-05 LAB — HEMOGLOBIN A1C: Hgb A1c MFr Bld: 7.1 % — ABNORMAL HIGH (ref <5.7)

## 2011-01-05 LAB — BASIC METABOLIC PANEL
BUN: 9 mg/dL (ref 6–23)
CO2: 26 mEq/L (ref 19–32)
Calcium: 9.8 mg/dL (ref 8.4–10.5)
Creatinine, Ser: 0.72 mg/dL (ref 0.50–1.10)
GFR calc non Af Amer: 88 mL/min — ABNORMAL LOW (ref >90)
Glucose, Bld: 167 mg/dL — ABNORMAL HIGH (ref 70–99)
Sodium: 139 mEq/L (ref 135–145)

## 2011-01-05 LAB — LIPID PANEL
LDL Cholesterol: 160 mg/dL — ABNORMAL HIGH (ref 0–99)
VLDL: 32 mg/dL (ref 0–40)

## 2011-01-05 MED ORDER — INFLUENZA VIRUS VACC SPLIT PF IM SUSP
0.5000 mL | INTRAMUSCULAR | Status: AC
  Administered 2011-01-06: 0.5 mL via INTRAMUSCULAR
  Filled 2011-01-05: qty 0.5

## 2011-01-05 MED ORDER — INSULIN ASPART 100 UNIT/ML ~~LOC~~ SOLN
0.0000 [IU] | Freq: Three times a day (TID) | SUBCUTANEOUS | Status: DC
  Filled 2011-01-05: qty 3

## 2011-01-05 MED ORDER — SODIUM CHLORIDE 0.9 % IV SOLN
INTRAVENOUS | Status: DC
  Administered 2011-01-05: 01:00:00 via INTRAVENOUS
  Administered 2011-01-05: 75 mL/h via INTRAVENOUS
  Administered 2011-01-06 – 2011-01-07 (×3): via INTRAVENOUS

## 2011-01-05 MED ORDER — ACETAMINOPHEN 650 MG RE SUPP: 650.0000 mg | Freq: Four times a day (QID) | RECTAL | Status: DC | PRN

## 2011-01-05 MED ORDER — ACETAMINOPHEN 325 MG PO TABS: 650.0000 mg | ORAL_TABLET | Freq: Four times a day (QID) | ORAL | Status: DC | PRN

## 2011-01-05 MED ORDER — OXYCODONE HCL 5 MG PO TABS: 5.0000 mg | ORAL_TABLET | ORAL | Status: DC | PRN

## 2011-01-05 MED ORDER — OLMESARTAN MEDOXOMIL 40 MG PO TABS
40.0000 mg | ORAL_TABLET | Freq: Every day | ORAL | Status: DC
  Administered 2011-01-05 – 2011-01-06 (×2): 40 mg via ORAL
  Filled 2011-01-05 (×3): qty 1

## 2011-01-05 MED ORDER — OMEGA-3-ACID ETHYL ESTERS 1 G PO CAPS
1.0000 g | ORAL_CAPSULE | Freq: Every day | ORAL | Status: DC
  Filled 2011-01-05 (×3): qty 1

## 2011-01-05 MED ORDER — LORAZEPAM 0.5 MG PO TABS
0.5000 mg | ORAL_TABLET | Freq: Two times a day (BID) | ORAL | Status: DC | PRN
  Administered 2011-01-05 – 2011-01-07 (×3): 0.5 mg via ORAL
  Filled 2011-01-05 (×3): qty 1

## 2011-01-05 MED ORDER — INSULIN ASPART 100 UNIT/ML ~~LOC~~ SOLN: 4.0000 [IU] | Freq: Three times a day (TID) | SUBCUTANEOUS | Status: DC

## 2011-01-05 MED ORDER — PREDNISONE 10 MG PO TABS
10.0000 mg | ORAL_TABLET | Freq: Every day | ORAL | Status: DC
  Administered 2011-01-05 – 2011-01-07 (×3): 10 mg via ORAL
  Filled 2011-01-05 (×3): qty 1

## 2011-01-05 MED ORDER — MECLIZINE HCL 25 MG PO TABS
25.0000 mg | ORAL_TABLET | Freq: Three times a day (TID) | ORAL | Status: DC | PRN
  Administered 2011-01-05: 25 mg via ORAL
  Filled 2011-01-05: qty 1

## 2011-01-05 MED ORDER — OLMESARTAN MEDOXOMIL 40 MG PO TABS
40.0000 mg | ORAL_TABLET | Freq: Once | ORAL | Status: AC
  Administered 2011-01-05: 40 mg via ORAL
  Filled 2011-01-05: qty 1

## 2011-01-05 MED ORDER — OMEGA 3 1000 MG PO CAPS: 1000.0000 mg | ORAL_CAPSULE | Freq: Every day | ORAL | Status: DC

## 2011-01-05 MED ORDER — GUAIFENESIN 100 MG/5ML PO SOLN
5.0000 mL | Freq: Three times a day (TID) | ORAL | Status: DC | PRN
  Administered 2011-01-05 – 2011-01-06 (×3): 100 mg via ORAL
  Filled 2011-01-05 (×3): qty 10

## 2011-01-05 MED ORDER — ONDANSETRON HCL 4 MG/2ML IJ SOLN
4.0000 mg | Freq: Four times a day (QID) | INTRAMUSCULAR | Status: DC | PRN
  Administered 2011-01-05 – 2011-01-07 (×2): 4 mg via INTRAVENOUS
  Filled 2011-01-05 (×2): qty 2

## 2011-01-05 MED ORDER — ENOXAPARIN SODIUM 40 MG/0.4ML ~~LOC~~ SOLN
40.0000 mg | SUBCUTANEOUS | Status: DC
  Administered 2011-01-05 – 2011-01-07 (×3): 40 mg via SUBCUTANEOUS
  Filled 2011-01-05 (×3): qty 0.4

## 2011-01-05 MED ORDER — ONDANSETRON HCL 4 MG PO TABS: 4.0000 mg | ORAL_TABLET | Freq: Four times a day (QID) | ORAL | Status: DC | PRN

## 2011-01-05 NOTE — Progress Notes (Signed)
Physical Therapy Evaluation Patient Details Name: Laura [name] MRN: [id number] DOB: October 30, 1945 Today's Date: 01/05/2011  Problem List:  Patient Active Problem List  Diagnoses  . DIABETES MELLITUS, TYPE II  . VITAMIN D DEFICIENCY  . HYPERLIPIDEMIA  . ANXIETY  . DEPRESSION  . Essential hypertension, benign  . CONSTIPATION  . Irritable bowel syndrome  . OVERACTIVE BLADDER  . HEMATURIA UNSPECIFIED  . OSTEOARTHRITIS  . SLEEP APNEA  . MURMUR  . DYSPNEA  . URINALYSIS, ABNORMAL  . Preventative health care  . Insomnia  . Bladder pain  . Vertigo  . Nystagmus    Past Medical History:  Past Medical History  Diagnosis Date  . Diabetes mellitus   . Hyperlipidemia   . Hypertension   . UTI (lower urinary tract infection)   . Anxiety    Past Surgical History:  Past Surgical History  Procedure Date  . Bladder surgery     PT Assessment/Plan/Recommendation PT Assessment Clinical Impression Statement: 65 y.o. female admitted to [facility] with nausea, dizziness and vertigo symptoms.  MRI (-) for stroke.  She presents with constant spontaneous right beating nystagmus in both eyes producing double vision with both eyes open.  With one eye covered, the double vision goes away, but she still reports a sensation of things moving and tilting in the room.  The nystagmus indicates a L sided neuritis likely per MD caused by recent upper respiratory infection (still ongoing).  She is unable to perform gaze stability or VOR testing and her vertigo doesn't chage with changes in position.  I did give the patient something to work on now until her next session: cover one eye and try for a few minutes to focus on an object in the room.  Then switch eyes.  Try this several times per day.  Next treatment try gait with one eye covered since the double vision is interfering with her depth perception and her ability to walk independently without LOB.  She has refused steriod to treat the neuritis per MD report.   The patient will need skilled PT during her acute stay to maximize her independence, functional mobility and safety so that she may be able to return home safely either after rehab or with HHPT.   PT Recommendation/Assessment: Patient will need skilled PT in the acute care venue PT Problem List: Decreased activity tolerance;Decreased balance;Decreased mobility;Decreased coordination Barriers to Discharge: Decreased caregiver support PT Therapy Diagnosis : Difficulty walking;Abnormality of gait PT Plan PT Frequency: Min 5X/week PT Treatment/Interventions: DME instruction;Gait training;Stair training;Functional mobility training;Therapeutic activities;Therapeutic exercise;Balance training;Neuromuscular re-education;Patient/family education PT Recommendation Recommendations for Other Services: OT consult Follow Up Recommendations: Home health PT;24 hour supervision/assistance Equipment Recommended: Other (comment) (TBD as patient progresses) PT Goals  Acute Rehab PT Goals PT Goal Formulation: With patient Time For Goal Achievement: 2 weeks Pt will go Sit to Stand: with modified independence PT Goal: Sit to Stand - Progress: Not met Pt will go Stand to Sit: with modified independence PT Goal: Stand to Sit - Progress: Not met Pt will Ambulate: >150 feet;with modified independence;with least restrictive assistive device PT Goal: Ambulate - Progress: Not met Pt will Go Up / Down Stairs: Flight;with modified independence PT Goal: Up/Down Stairs - Progress: Not met Pt will Perform Home Exercise Program: Independently PT Goal: Perform Home Exercise Program - Progress: Not met Additional Goals Additional Goal #1: Patient will report a 50% reduction in dizziness symptoms and double vision before leaving the hospital.   PT Goal: Additional Goal #1 -  Progress: Not met  PT Evaluation Precautions/Restrictions  Precautions Precautions: Fall Prior Functioning  Home Living Lives With: Alone (at  times granddaughter stays with her, but she is in school) Receives Help From: Family (no 24 hour) Type of Home: House Home Layout: Two level Alternate Level Stairs-Rails: Right Alternate Level Stairs-Number of Steps: 12 Home Access: Stairs to enter Entrance Stairs-Rails: None Entrance Stairs-Number of Steps: 1 Home Adaptive Equipment: None Prior Function Level of Independence: Independent with basic ADLs;Independent with homemaking with ambulation;Independent with gait;Independent with transfers Able to Take Stairs?: Yes Driving: Yes Cognition Cognition Arousal/Alertness: Awake/alert Overall Cognitive Status: Appears within functional limits for tasks assessed Sensation/Coordination   Extremity Assessment RLE Assessment RLE Assessment: Within Functional Limits LLE Assessment LLE Assessment: Within Functional Limits Mobility (including Balance) Bed Mobility Bed Mobility: Yes Supine to Sit: 6: Modified independent (Device/Increase time);With rails;HOB flat Sitting - Scoot to Edge of Bed: 6: Modified independent (Device/Increase time);With rail Sit to Supine - Right: 6: Modified independent (Device/Increase time);With rail;HOB flat Transfers Transfers: Yes Sit to Stand: 4: Min assist;With upper extremity assist Sit to Stand Details (indicate cue type and reason): min hand held assist to stabilize the patient for balance.   Stand to Sit: 4: Min assist Stand to Sit Details: min hand held assist to help patient stabilize for balance.   Ambulation/Gait Ambulation/Gait: Yes Ambulation/Gait Assistance: 4: Min assist Ambulation/Gait Assistance Details (indicate cue type and reason): min hand held assist to help patient stabilize for balance.   Ambulation Distance (Feet): 30 Feet (15' x2) Assistive device: 1 person hand held assist Gait Pattern:  (staggering)  Balance Balance Assessed: Yes Static Sitting Balance Static Sitting - Balance Support: No upper extremity supported;Feet  supported Static Sitting - Level of Assistance: 7: Independent Static Sitting - Comment/# of Minutes: 5 Static Standing Balance Static Standing - Balance Support: Left upper extremity supported Static Standing - Level of Assistance: 4: Min assist Static Standing - Comment/# of Minutes: 3 Dynamic Standing Balance Dynamic Standing - Balance Support: Left upper extremity supported Dynamic Standing - Level of Assistance: 4: Min assist Dynamic Standing - Balance Activities:  (gait) Dynamic Standing - Comments: The pateint is unable to stand or walk without outside support safely.   Exercise    End of Session PT - End of Session Activity Tolerance: Other (comment) (limited by double vision, nausea and dizziness) Patient left: in bed;with call bell in reach General Behavior During Session: [facility] for tasks performed Cognition: [facility] for tasks performed  Antavius Sperbeck B. Crosley Stejskal, PT, DPT [phone]  01/05/2011, 10:21 AM

## 2011-01-05 NOTE — Progress Notes (Signed)
Subjective: Still with rapid beat right nystagmus, vertigo, nausea.  Objective: Vital signs in last 24 hours: Temp:  [97.8 F (36.6 C)-98.4 F (36.9 C)] 98.3 F (36.8 C) (12/23 0500) Pulse Rate:  [72-90] 74  (12/23 0500) Resp:  [16-20] 18  (12/23 0500) BP: (116-201)/(63-101) 136/76 mmHg (12/23 0500) SpO2:  [95 %-99 %] 96 % (12/23 0500) Weight:  [87.7 kg (193 lb 5.5 oz)] 193 lb 5.5 oz (87.7 kg) (12/23 0010) Weight change:  Last BM Date: 01/03/11  Intake/Output from previous day: 12/22 0701 - 12/23 0700 In: 636.3 [P.O.:240; I.V.:396.3] Out: 350 [Urine:350]     Physical Exam: General: Alert, awake, oriented x3. HEENT: No bruits, no goiter, nystagmus present with rapid portion to the right. Heart: Regular rate and rhythm, without murmurs, rubs, gallops. Lungs: Clear to auscultation bilaterally. Abdomen: Soft, nontender, nondistended, positive bowel sounds. Extremities: No clubbing cyanosis or edema with positive pedal pulses. Neuro: Grossly intact, nonfocal.    Lab Results: Basic Metabolic Panel:  Basename 01/05/11 0454 01/04/11 1449  NA 139 137  K 3.8 4.2  CL 103 102  CO2 26 25  GLUCOSE 167* 204*  BUN 9 10  CREATININE 0.72 0.66  CALCIUM 9.8 9.5  MG -- --  PHOS -- --   CBC:  Basename 01/05/11 0454 01/04/11 1449  WBC 7.8 7.8  NEUTROABS -- --  HGB 11.7* 11.9*  HCT 35.2* 35.1*  MCV 85.4 84.6  PLT 250 234   Cardiac Enzymes:  Basename 01/04/11 2030  CKTOTAL --  CKMB --  CKMBINDEX --  TROPONINI <0.30   CBG:  Basename 01/05/11 0746  GLUCAP 144*   Hemoglobin A1C:  Basename 01/05/11 0454  HGBA1C 7.1*   Fasting Lipid Panel:  Basename 01/05/11 0454  CHOL 238*  HDL 46  LDLCALC 160*  TRIG 159*  CHOLHDL 5.2  LDLDIRECT --   Studies/Results: Mr Brain Wo Contrast  01/04/2011  *RADIOLOGY REPORT*  Clinical Data: Vertigo.  Dizziness  MRI HEAD WITHOUT CONTRAST  Technique:  Multiplanar, multiecho pulse sequences of the brain and surrounding structures  were obtained according to standard protocol without intravenous contrast.  Comparison: CT 07/13/2006  Findings: Negative for acute infarct.  Small hyperintensity left parietal white matter, likely due to chronic ischemia.  Brainstem is intact.  Ventricle size is normal.  Negative for mass lesion.  No hemorrhage or fluid collection is identified.  Mild mucosal edema in the paranasal sinuses.  Mild mucosal edema in the mastoid sinus bilaterally.  IMPRESSION: Mild chronic microvascular ischemia left parietal white matter.  No acute infarct.  Mild chronic sinusitis.  Original Report Authenticated By: Camelia Phenes, M.D.    Medications: Scheduled Meds:   . sodium chloride  1,000 mL Intravenous Once  . enoxaparin  40 mg Subcutaneous Q24H  . influenza  inactive virus vaccine  0.5 mL Intramuscular Tomorrow-1000  . insulin aspart  0-15 Units Subcutaneous TID WC  . insulin aspart  4 Units Subcutaneous TID WC  . LORazepam  1 mg Intravenous Once  . LORazepam  1 mg Intravenous Once  . meclizine  50 mg Oral Once  . olmesartan  40 mg Oral Daily  . olmesartan  40 mg Oral Once  . omega-3 acid ethyl esters  1 g Oral Daily  . ondansetron (ZOFRAN) IV  4 mg Intravenous Q5 min  . promethazine  25 mg Intravenous Once  . DISCONTD: OMEGA 3  1,000 mg Oral Daily  . DISCONTD: ondansetron (ZOFRAN) IV  8 mg Intravenous Once   Continuous  Infusions:   . sodium chloride 75 mL/hr at 01/05/11 0043   PRN Meds:.acetaminophen, acetaminophen, LORazepam, meclizine, ondansetron (ZOFRAN) IV, ondansetron, oxyCODONE, DISCONTD: ondansetron (ZOFRAN) IV  Assessment/Plan:  Principal Problem:  *Vertigo Active Problems:  Nystagmus  DIABETES MELLITUS, TYPE II  HYPERLIPIDEMIA  Essential hypertension, benign   #1 vertigo, nystagmus: Suspect secondary to vestibular neuritis given her recent and still ongoing upper respiratory viral infection. She does not have hearing loss to suggest labyrinthitis (she has congenital left sided  deafness). Review of literature indicates that the symptoms tend to improve spontaneously after a few days. Vestibular exercises certainly help and I appreciate PTs assistance with these. Corticosteroids have also been shown to be effective to decrease inflammation surrounding the vestibular nerve, however patient refuses to take steroids because of an "allergic reaction" that she had in the past. She is still very dizzy upon ambulation and has no support system at home hence it is not safe to discharge her yet. We'll add meclizine and we will continue vestibular exercises while in the hospital. Of note MRI was negative for posterior circulation CVA.   LOS: 1 day   [facility] Pager: [phone] 01/05/2011, 11:48 AM

## 2011-01-06 LAB — GLUCOSE, CAPILLARY
Glucose-Capillary: 131 mg/dL — ABNORMAL HIGH (ref 70–99)
Glucose-Capillary: 141 mg/dL — ABNORMAL HIGH (ref 70–99)
Glucose-Capillary: 207 mg/dL — ABNORMAL HIGH (ref 70–99)

## 2011-01-06 NOTE — Progress Notes (Signed)
Received a call from [facility], unable to take the patient either will contact [facility] who is preferred provider.

## 2011-01-06 NOTE — Progress Notes (Signed)
Subjective: Says she feels about 40% better today. She still not able to focus on an origin from within a few seconds but is no longer quite as dizzy and nauseous. Her fast beat nystagmus seems to be a little slower today as well.  Objective: Vital signs in last 24 hours: Temp:  [97.5 F (36.4 C)-98.5 F (36.9 C)] 97.5 F (36.4 C) (12/24 0612) Pulse Rate:  [69-77] 69  (12/24 0612) Resp:  [17-18] 17  (12/24 0612) BP: (148-164)/(62-81) 151/81 mmHg (12/24 0612) SpO2:  [98 %-99 %] 99 % (12/24 0951) Weight:  [88.2 kg (194 lb 7.1 oz)] 194 lb 7.1 oz (88.2 kg) (12/24 0612) Weight change: 0.5 kg (1 lb 1.6 oz) Last BM Date: 01/03/11  Intake/Output from previous day: 12/23 0701 - 12/24 0700 In: 2305 [P.O.:1180; I.V.:1125] Out: 3000 [Urine:3000] Total I/O In: 240 [P.O.:240] Out: 400 [Urine:400]   Physical Exam: General: Alert, awake, oriented x3, in no acute distress. HEENT: No bruits, no goiter. Right-sided horizontal nystagmus. Heart: Regular rate and rhythm, without murmurs, rubs, gallops. Lungs: Clear to auscultation bilaterally. Abdomen: Soft, nontender, nondistended, positive bowel sounds. Extremities: No clubbing cyanosis or edema with positive pedal pulses. Neuro: Grossly intact, nonfocal.    Lab Results: Basic Metabolic Panel:  Basename 01/05/11 0454 01/04/11 1449  NA 139 137  K 3.8 4.2  CL 103 102  CO2 26 25  GLUCOSE 167* 204*  BUN 9 10  CREATININE 0.72 0.66  CALCIUM 9.8 9.5  MG -- --  PHOS -- --   CBC:  Basename 01/05/11 0454 01/04/11 1449  WBC 7.8 7.8  NEUTROABS -- --  HGB 11.7* 11.9*  HCT 35.2* 35.1*  MCV 85.4 84.6  PLT 250 234   Cardiac Enzymes:  Basename 01/04/11 2030  CKTOTAL --  CKMB --  CKMBINDEX --  TROPONINI <0.30   CBG:  Basename 01/06/11 1145 01/06/11 0728 01/05/11 2106 01/05/11 1717 01/05/11 1143 01/05/11 0746  GLUCAP 141* 131* 185* 183* 134* 144*   Hemoglobin A1C:  Basename 01/05/11 0454  HGBA1C 7.1*   Fasting Lipid  Panel:  Basename 01/05/11 0454  CHOL 238*  HDL 46  LDLCALC 160*  TRIG 159*  CHOLHDL 5.2  LDLDIRECT --   T Studies/Results: Mr Brain Wo Contrast  01/04/2011  *RADIOLOGY REPORT*  Clinical Data: Vertigo.  Dizziness  MRI HEAD WITHOUT CONTRAST  Technique:  Multiplanar, multiecho pulse sequences of the brain and surrounding structures were obtained according to standard protocol without intravenous contrast.  Comparison: CT 07/13/2006  Findings: Negative for acute infarct.  Small hyperintensity left parietal white matter, likely due to chronic ischemia.  Brainstem is intact.  Ventricle size is normal.  Negative for mass lesion.  No hemorrhage or fluid collection is identified.  Mild mucosal edema in the paranasal sinuses.  Mild mucosal edema in the mastoid sinus bilaterally.  IMPRESSION: Mild chronic microvascular ischemia left parietal white matter.  No acute infarct.  Mild chronic sinusitis.  Original Report Authenticated By: Camelia Phenes, M.D.    Medications: Scheduled Meds:   . enoxaparin  40 mg Subcutaneous Q24H  . influenza  inactive virus vaccine  0.5 mL Intramuscular Tomorrow-1000  . insulin aspart  0-15 Units Subcutaneous TID WC  . insulin aspart  4 Units Subcutaneous TID WC  . olmesartan  40 mg Oral Daily  . omega-3 acid ethyl esters  1 g Oral Daily  . predniSONE  10 mg Oral Q breakfast   Continuous Infusions:   . sodium chloride 75 mL/hr at 01/06/11 0330  PRN Meds:.acetaminophen, acetaminophen, guaiFENesin, LORazepam, meclizine, ondansetron (ZOFRAN) IV, ondansetron, oxyCODONE  Assessment/Plan:  Principal Problem:  *Vertigo Active Problems:  Nystagmus  DIABETES MELLITUS, TYPE II  HYPERLIPIDEMIA  Essential hypertension, benign   #1 vertigo, nystagmus: I suspect secondary to vestibular neuritis given her still ongoing upper respiratory viral tract infection. She is improved today, I suspect this is related to steroids which are helping reduce inflammation around the  vestibular nerve. Continue vestibular exercises. Hopeful for discharge home tomorrow.   LOS: 2 days   [facility] Pager: [phone] 01/06/2011, 12:45 PM

## 2011-01-06 NOTE — Progress Notes (Signed)
Physical Therapy Treatment Patient Details Name: Laura [name] MRN: [id number] DOB: [birth year] Today's Date: 01/06/2011 [id number] 1GT,1NR  PT Assessment/Plan  PT - Assessment/Plan Comments on Treatment Session: Patient with some improvement and able to walk with walker with supervision and cues for visual techniques.  Feel she will progress to go home with HHPT (hopefully with vestibular trained therapist.)   PT Plan: Discharge plan remains appropriate PT Frequency: Min 3X/week Follow Up Recommendations: Home health PT Equipment Recommended: Rolling walker with 5" wheels PT Goals  Acute Rehab PT Goals PT Goal: Sit to Stand - Progress: Progressing toward goal PT Goal: Stand to Sit - Progress: Progressing toward goal PT Goal: Ambulate - Progress: Progressing toward goal PT Goal: Perform Home Exercise Program - Progress: Progressing toward goal  PT Treatment Precautions/Restrictions  Precautions Precautions: Fall Required Braces or Orthoses: No Restrictions Weight Bearing Restrictions: No Mobility (including Balance) Bed Mobility Bed Mobility: Yes Supine to Sit: 6: Modified independent (Device/Increase time) Sitting - Scoot to Edge of Bed: 6: Modified independent (Device/Increase time) Sit to Supine - Right: 6: Modified independent (Device/Increase time) Transfers Sit to Stand: 5: Supervision;From bed;From toilet;With upper extremity assist Sit to Stand Details (indicate cue type and reason): cues for visual compensatory technique to decrease vertigo Stand to Sit: To toilet;To bed;With upper extremity assist Ambulation/Gait Ambulation/Gait Assistance: 5: Supervision Ambulation/Gait Assistance Details (indicate cue type and reason): slow and with cues for each turn to refix on new target prior to turning.  Also edcuation on how to deal with double vision. Ambulation Distance (Feet): 150 Feet Assistive device: Rolling walker Gait Pattern: Decreased stride length    Exercise    Other Exercises Other Exercises: Reinforced tracking exercises as well as visual compensatory techniques with patient.  She still cannot do tracking esp in right visual field without significant double vision.  Has convergence with far left visual field.  Encouraged binocular vision unless balance disturbed.  Not yet ready for head turning exercises due to target moving and doubled in most of visual field. End of Session PT - End of Session Equipment Utilized During Treatment: Gait belt Activity Tolerance: Patient tolerated treatment well Patient left: in bed General Behavior During Session: [facility] for tasks performed (tearful at end of session, but calmed easily RN aware) Cognition: WFL for tasks performed  [facility] 01/06/2011, 11:54 AM

## 2011-01-06 NOTE — Progress Notes (Signed)
Occupational Therapy Evaluation Patient Details Name: Laura [name] MRN: [id number] DOB: 1945/01/23 Today's Date: 01/06/2011 08:54-09:54  evII Problem List:  Patient Active Problem List  Diagnoses  . DIABETES MELLITUS, TYPE II  . VITAMIN D DEFICIENCY  . HYPERLIPIDEMIA  . ANXIETY  . DEPRESSION  . Essential hypertension, benign  . CONSTIPATION  . Irritable bowel syndrome  . OVERACTIVE BLADDER  . HEMATURIA UNSPECIFIED  . OSTEOARTHRITIS  . SLEEP APNEA  . MURMUR  . DYSPNEA  . URINALYSIS, ABNORMAL  . Preventative health care  . Insomnia  . Bladder pain  . Vertigo  . Nystagmus    Past Medical History:  Past Medical History  Diagnosis Date  . Diabetes mellitus   . Hyperlipidemia   . Hypertension   . UTI (lower urinary tract infection)   . Anxiety    Past Surgical History:  Past Surgical History  Procedure Date  . Bladder surgery     OT Assessment/Plan/Recommendation OT Assessment Clinical Impression Statement: Pleasant 65 year old admitted with vertigo and and nystagmus at rest.  Presents with increased dizziness and diplopia resulting in the need for greater supervision/assistance with ADLs.  Feel pt will benefit from acute rehab services to help address current increase in dependence with ADLs  by providing education on DME as well as treatment strategies to help decrease  symptoms.  Pt reports that she will have 24 hour assistance at initial d/c.   OT Recommendation/Assessment: Patient will need skilled OT in the acute care venue OT Problem List: Impaired balance (sitting and/or standing);Impaired vision/perception;Decreased knowledge of use of DME or AE OT Therapy Diagnosis : Other (comment) (Visual disturbance) OT Plan OT Frequency: Min 2X/week OT Treatment/Interventions: Self-care/ADL training;DME and/or AE instruction;Balance training;Patient/family education;Visual/perceptual remediation/compensation;Therapeutic activities OT Recommendation Follow Up  Recommendations: Home health OT (Safety eval depending on progress.) Equipment Recommended:  (Pt believes she can borrow a shower seat.) Individuals Consulted Consulted and Agree with Results and Recommendations: Patient OT Goals Acute Rehab OT Goals OT Goal Formulation: With patient Time For Goal Achievement: 2 weeks ADL Goals Pt Will Perform Grooming: with modified independence;Standing at sink ADL Goal: Grooming - Progress: Progressing toward goals Pt Will Transfer to Toilet: with modified independence;with DME;Regular height toilet ADL Goal: Toilet Transfer - Progress: Progressing toward goals Pt Will Perform Tub/Shower Transfer: with modified independence;with DME;Shower seat without back ADL Goal: Web designer - Progress: Progressing toward goals Miscellaneous OT Goals Miscellaneous OT Goal #1: Pt will perform all bathing and dressing at a modified independent level. OT Goal: Miscellaneous Goal #1 - Progress: Progressing toward goals Miscellaneous OT Goal #2: Pt will perform all selfcare tasks with dizziness no greater than 2/5.   OT Goal: Miscellaneous Goal #2 - Progress: Progressing toward goals  OT Evaluation Precautions/Restrictions  Precautions Precautions: Fall Required Braces or Orthoses: No Restrictions Weight Bearing Restrictions: No Prior Functioning Home Living Lives With: Alone (at times granddaughter stays with her, but she is in school) Receives Help From: Family (no 24 hour) Type of Home: House Home Layout: Two level Alternate Level Stairs-Rails: Right Alternate Level Stairs-Number of Steps: 12 Home Access: Stairs to enter Entrance Stairs-Rails: None Entrance Stairs-Number of Steps: 1 Bathroom Shower/Tub: Engineer, manufacturing systems: Standard Bathroom Accessibility: Yes Home Adaptive Equipment: None Prior Function Level of Independence: Independent with basic ADLs;Independent with homemaking with ambulation;Independent with gait;Independent  with transfers Driving: Yes ADL ADL Eating/Feeding: Simulated;Independent Where Assessed - Eating/Feeding: Chair Grooming: Simulated;Supervision/safety Where Assessed - Grooming: Standing at sink Lower Body Bathing: Simulated;Set up Where  Assessed - Lower Body Bathing: Unsupported;Sit to stand from bed Upper Body Dressing: Simulated;Supervision/safety Where Assessed - Upper Body Dressing: Sitting, bed;Unsupported Lower Body Dressing: Simulated;Supervision/safety Where Assessed - Lower Body Dressing: Sit to stand from bed Toilet Transfer: Performed;Minimal assistance Toilet Transfer Method: Ambulating Toilet Transfer Equipment: Regular height toilet Toileting - Clothing Manipulation: Simulated;Supervision/safety Where Assessed - Toileting Clothing Manipulation: Sit to stand from 3-in-1 or toilet Toileting - Hygiene: Simulated Where Assessed - Toileting Hygiene: Sit to stand from 3-in-1 or toilet Tub/Shower Transfer: Simulated;Minimal assistance Tub/Shower Transfer Method: Ambulating ADL Comments: Pt doing well with simulated ADLs.  Occassional LOB with mobility.  Reports dizziness at a 4/10  lying and with sitting.  Reports an increase in dizziness if attempting to bend forward toward her feet.  Instructed her to get a reacher to help avoid bending.  Also instructed her to bring her LEs up and cross  them for dressing instead of bending down toward her feet. Vision/Perception  Vision - History Baseline Vision: No visual deficits Patient Visual Report: Diplopia;Blurring of vision;Unable to keep objects in focus (right side horizontal nystagmus at all times) Vision - Assessment Eye Alignment: Impaired (comment) (Right eye sits superior to left eye.) Additional Comments: Pt with good occular ROM bilaterally.  Does exhibit nystagmus horizontally to the right in both eyes.  During tracking, pt reports double vision on to the right of midline with one object sitting on top of the other.  She  also reports things being slanted down to the right.  Double vision is not as prevalent in near vision on the right side as with distant vision..  The double vision was also present with distant vision in the inferior field.  Perception Perception: Within Functional Limits Praxis Praxis: Intact Cognition Cognition Arousal/Alertness: Awake/alert Overall Cognitive Status: Appears within functional limits for tasks assessed Orientation Level: Oriented X4 Sensation/Coordination Sensation Light Touch: Appears Intact Stereognosis: Appears Intact Hot/Cold: Appears Intact Proprioception: Appears Intact Coordination Gross Motor Movements are Fluid and Coordinated: Yes Fine Motor Movements are Fluid and Coordinated: Yes Extremity Assessment RUE Assessment RUE Assessment: Within Functional Limits LUE Assessment LUE Assessment: Within Functional Limits Mobility  Bed Mobility Bed Mobility: Yes Supine to Sit: 6: Modified independent (Device/Increase time) Sitting - Scoot to Edge of Bed: 6: Modified independent (Device/Increase time) Sit to Supine - Right: 6: Modified independent (Device/Increase time) Transfers Transfers: Yes Sit to Stand: 5: Supervision;From bed;From toilet Exercises Other Exercises Other Exercises: Pt instructed on visula tracking exercises hsing her thumb to help decrease the double vision.  Encouraged to perform them 5 to 6 times a day. End of Session OT - End of Session Activity Tolerance: Patient tolerated treatment well Patient left: in bed General Behavior During Session: [facility] for tasks performed Cognition: [facility] for tasks performed   Jerri Glauser OTR/L 01/06/2011, 10:25 AM  Pager number [phone]

## 2011-01-06 NOTE — Progress Notes (Addendum)
Spoke with Jan at [facility], will be able to take the patient for PT/OT, do not have Vestibular rehab. Dr. Ardyth Harps made aware, will likely f/u with PCP for possible OP Vestibular Rehab when able to travel, for now with proceed with PT/OT. Therapy can be initiated on 01-09-11 per [facility]. Demographics, H&P, and orders faxed to [facility]. Anticipate d/c on 01-07-11. Patient updated and is agreeable with the plan of care. RW arranged with [facility].

## 2011-01-06 NOTE — Progress Notes (Signed)
UR completed 

## 2011-01-06 NOTE — Progress Notes (Signed)
Spoke with patient at bedside. Stilling having issues with vertigo and double vision. States she lives a home alone but has good family support and feels she will have someone available when she is d/ced home. Per PT/OT evaluations will need HH PT and OT at d/c. Patient also requesting a walker. Patient chose [facility] for [facility] services, contacted [name] with [facility] to arrange. Will continue to follow for d/c needs, patient appreciative of visit.

## 2011-01-06 NOTE — Progress Notes (Signed)
[facility] unable to take patient at this time. Patient had requested [facility] if [facility] not available. Contacted Debbie with [facility] to arrange, she states they will be able to take her. Anticipate d/c in am, awaiting final orders for [facility] services and DME from attending.

## 2011-01-07 LAB — GLUCOSE, CAPILLARY: Glucose-Capillary: 159 mg/dL — ABNORMAL HIGH (ref 70–99)

## 2011-01-07 MED ORDER — MECLIZINE HCL 25 MG PO TABS: 25.0000 mg | ORAL_TABLET | Freq: Three times a day (TID) | ORAL | Status: DC | PRN

## 2011-01-07 MED ORDER — PREDNISONE 10 MG PO TABS: 10.0000 mg | ORAL_TABLET | Freq: Every day | ORAL | Status: DC

## 2011-01-07 NOTE — Discharge Summary (Signed)
Physician Discharge Summary  Patient ID: TALEIGHA PINSON MRN: [id number] DOB/AGE: 1945/06/20 65 y.o.  Admit date: 01/04/2011 Discharge date: 01/07/2011  Primary Care Physician:  Oliver Barre, MD, MD   Discharge Diagnoses:    Principal Problem:  *Vertigo Active Problems:  Nystagmus  DIABETES MELLITUS, TYPE II  HYPERLIPIDEMIA  Essential hypertension, benign    Current Discharge Medication List    START taking these medications   Details  meclizine (ANTIVERT) 25 MG tablet Take 1 tablet (25 mg total) by mouth 3 (three) times daily as needed for dizziness or nausea. Qty: 30 tablet, Refills: 0    predniSONE (DELTASONE) 10 MG tablet Take 1 tablet (10 mg total) by mouth daily with breakfast. For 7 days, then half a tablet daily for 5 days then discontinue. Qty: 1 tablet, Refills: 1      CONTINUE these medications which have NOT CHANGED   Details  benzonatate (TESSALON) 100 MG capsule Take 100-200 mg by mouth 3 (three) times daily as needed. For cough.     Chlorphen-Pseudoephed-APAP (CORICIDIN D) 2-30-325 MG TABS Take 1 tablet by mouth as needed. For cold symptoms.     cholecalciferol (VITAMIN D) 1000 UNITS tablet Take 1,000 Units by mouth daily.      diphenhydrAMINE (BENADRYL) 25 MG tablet Take 25 mg by mouth daily as needed. For allergies.     HYDROcodone-homatropine (HYCODAN) 5-1.5 MG/5ML syrup Take 5 mLs by mouth every 6 (six) hours as needed. For cough.     hydrocortisone cream 1 % Apply 1 application topically as needed. For itchy spot on bottom of foot.     ibuprofen (ADVIL) 200 MG tablet Take 200 mg by mouth every 6 (six) hours as needed. For pain.     LORazepam (ATIVAN) 0.5 MG tablet Take 0.5 mg by mouth 2 (two) times daily as needed. For anxiety or insomnia.     olmesartan (BENICAR) 40 MG tablet Take 1 tablet (40 mg total) by mouth daily. Qty: 90 tablet, Refills: 3   Associated Diagnoses: Essential hypertension, benign    OMEGA 3 1000 MG CAPS Take 1,000 mg by  mouth daily.     atorvastatin (LIPITOR) 20 MG tablet Take 1 tablet (20 mg total) by mouth daily. Qty: 90 tablet, Refills: 3    Cyanocobalamin (B-12) 100 MCG TABS Take by mouth daily.     GARLIC PO Take by mouth daily.           Disposition and Follow-up:  Patient will be discharged home today in improved condition. We have arranged for home health PT and OT. She'll need to followup with primary care physician. If no improvement within 10-14 days, then I would suggest referral to ENT for further evaluation.  Consults:  none    Significant Diagnostic Studies:  Mr Brain Wo Contrast  01/04/2011  *RADIOLOGY REPORT*  Clinical Data: Vertigo.  Dizziness  MRI HEAD WITHOUT CONTRAST  Technique:  Multiplanar, multiecho pulse sequences of the brain and surrounding structures were obtained according to standard protocol without intravenous contrast.  Comparison: CT 07/13/2006  Findings: Negative for acute infarct.  Small hyperintensity left parietal white matter, likely due to chronic ischemia.  Brainstem is intact.  Ventricle size is normal.  Negative for mass lesion.  No hemorrhage or fluid collection is identified.  Mild mucosal edema in the paranasal sinuses.  Mild mucosal edema in the mastoid sinus bilaterally.  IMPRESSION: Mild chronic microvascular ischemia left parietal white matter.  No acute infarct.  Mild chronic sinusitis.  Original Report  Authenticated By: Camelia Phenes, M.D.    Brief H and P: For complete details please refer to admission H and P, but in brief patient is a very pleasant 65 year old black woman with history of hypertension, hyperlipidemia and type 2 diabetes presented to the hospital with dizziness and nausea. For about a two-week she has been having a viral upper respiratory infection with cough, runny nose and ear pain. She woke up on day of admission with extreme vertigo and sensation of room spinning around her. Because of this she called EMS and we were called to admit  her for further evaluation and management.    Hospital Course:  Principal Problem:  *Vertigo Active Problems:  Nystagmus  DIABETES MELLITUS, TYPE II  HYPERLIPIDEMIA  Essential hypertension, benign   #1 vertigo/nausea/nystagmus: On admission she was found to have right-sided horizontal nystagmus. She was evaluated by PT and OT with vestibular exercises. She had trouble focusing on objects given her nystagmus. She has improved with physical therapy. I believe she probably has vestibular neuritis given her recent upper respiratory infection. She has had some improvement in symptoms with prednisone. I have recommended that she continue prednisone for about 10 more days. If she fails to improve within about 10-14 days then I would suggest referral to ENT for further evaluation.  All rest of chronic medical conditions have been stable this hospitalization and her home medications have not been changed.  Time spent on Discharge: Greater than 30 minutes.  SignedChaya Jan [facility] Pager: [phone] 01/07/2011, 10:50 AM

## 2011-01-08 LAB — GLUCOSE, CAPILLARY

## 2011-01-13 ENCOUNTER — Telehealth: Payer: Self-pay | Admitting: Internal Medicine

## 2011-01-13 DIAGNOSIS — R42 Dizziness and giddiness: Secondary | ICD-10-CM

## 2011-01-13 DIAGNOSIS — H9209 Otalgia, unspecified ear: Secondary | ICD-10-CM

## 2011-01-13 NOTE — Telephone Encounter (Signed)
Done per emr  Pt might consider neurology referral if not helped by this

## 2011-01-13 NOTE — Telephone Encounter (Signed)
The pt called and is requesting a referral for an Ear, Nose and Throat dr.   Lynford Humphrey!

## 2011-01-22 ENCOUNTER — Telehealth: Payer: Self-pay | Admitting: Internal Medicine

## 2011-01-22 MED ORDER — MECLIZINE HCL 25 MG PO TABS: 25.0000 mg | ORAL_TABLET | Freq: Three times a day (TID) | ORAL | Status: DC | PRN

## 2011-01-22 NOTE — Telephone Encounter (Signed)
Per pt need meclizine hcl25mg ---Laura [name]---

## 2011-01-23 ENCOUNTER — Ambulatory Visit (INDEPENDENT_AMBULATORY_CARE_PROVIDER_SITE_OTHER): Payer: Medicare Other | Admitting: Internal Medicine

## 2011-01-23 ENCOUNTER — Encounter: Payer: Self-pay | Admitting: Internal Medicine

## 2011-01-23 VITALS — BP 142/80 | HR 69 | Temp 97.2°F | Ht 64.0 in | Wt 192.1 lb

## 2011-01-23 DIAGNOSIS — E119 Type 2 diabetes mellitus without complications: Secondary | ICD-10-CM

## 2011-01-23 DIAGNOSIS — R42 Dizziness and giddiness: Secondary | ICD-10-CM

## 2011-01-23 DIAGNOSIS — I10 Essential (primary) hypertension: Secondary | ICD-10-CM

## 2011-01-23 DIAGNOSIS — F411 Generalized anxiety disorder: Secondary | ICD-10-CM

## 2011-01-23 MED ORDER — MECLIZINE HCL 25 MG PO TABS: 25.0000 mg | ORAL_TABLET | Freq: Three times a day (TID) | ORAL | Status: AC | PRN

## 2011-01-23 NOTE — Patient Instructions (Signed)
Please stop the coricidin D You can also take Mucinex (or it's generic off brand) for congestion You are given the refill of the meclizine Continue all other medications as before

## 2011-01-26 ENCOUNTER — Encounter: Payer: Self-pay | Admitting: Internal Medicine

## 2011-01-26 NOTE — Progress Notes (Signed)
Subjective:    Patient ID: Laura [name], female    DOB: 12/10/1945, 66 y.o.   MRN: [id number]  HPI  Here to f/u; overall vertigo still present but improved, better with meclizine, needs refill.  No pain, fever. Hosp'd dec 22-25, d/c with meclizine, pred and PT.  MRI neg while hosp;d, ENT f/u neg for acute ear or other 2 days ago.  PT at home at least helps with anxiety adn states overall about 75% improved.  Lives with granduahgter, no falls, no further n/v.  No new complaints.  Pt denies chest pain, increased sob or doe, wheezing, orthopnea, PND, increased LE swelling, palpitations, dizziness or syncope.  Pt denies new neurological symptoms such as new headache, or facial or extremity weakness or numbness.    Pt denies polydipsia, polyuria, or low sugar symptoms such as weakness or confusion improved with po intake.  Pt states overall good compliance with meds, trying to follow lower cholesterol, diabetic diet, wt overall stable but little exercise however.     Denies worsening depressive symptoms, suicidal ideation, or panic, though has ongoing anxiety.  Past Medical History  Diagnosis Date  . Diabetes mellitus   . Hyperlipidemia   . Hypertension   . UTI (lower urinary tract infection)   . Anxiety    Past Surgical History  Procedure Date  . Bladder surgery     reports that she has never smoked. She does not have any smokeless tobacco history on file. She reports that she does not drink alcohol or use illicit drugs. family history includes Diabetes in her others and Hypertension in her other. Allergies  Allergen Reactions  . Codeine     itch  . Crestor (Rosuvastatin Calcium)   . Lipitor (Atorvastatin Calcium)   . Prednisone     Nervous    . Sulfa Antibiotics     Tongue swells, hives, itching   Current Outpatient Prescriptions on File Prior to Visit  Medication Sig Dispense Refill  . cholecalciferol (VITAMIN D) 1000 UNITS tablet Take 1,000 Units by mouth daily.        .  Cyanocobalamin (B-12) 100 MCG TABS Take by mouth daily.       . diphenhydrAMINE (BENADRYL) 25 MG tablet Take 25 mg by mouth daily as needed. For allergies.       Marland Kitchen GARLIC PO Take by mouth daily.        . hydrocortisone cream 1 % Apply 1 application topically as needed. For itchy spot on bottom of foot.       Marland Kitchen ibuprofen (ADVIL) 200 MG tablet Take 200 mg by mouth every 6 (six) hours as needed. For pain.       Marland Kitchen LORazepam (ATIVAN) 0.5 MG tablet Take 0.5 mg by mouth 2 (two) times daily as needed. For anxiety or insomnia.       Marland Kitchen olmesartan (BENICAR) 40 MG tablet Take 1 tablet (40 mg total) by mouth daily.  90 tablet  3  . OMEGA 3 1000 MG CAPS Take 1,000 mg by mouth daily.       . predniSONE (DELTASONE) 10 MG tablet Take 1 tablet (10 mg total) by mouth daily with breakfast. For 7 days, then half a tablet daily for 5 days then discontinue.  1 tablet  1     Review of Systems Review of Systems  Constitutional: Negative for diaphoresis and unexpected weight change.  HENT: Negative for drooling and tinnitus.   Eyes: Negative for photophobia and visual disturbance.  Respiratory: Negative  for choking and stridor.   Gastrointestinal: Negative for vomiting and blood in stool.  Genitourinary: Negative for hematuria and decreased urine volume.      Objective:   Physical Exam BP 142/80  Pulse 69  Temp(Src) 97.2 F (36.2 C) (Oral)  Ht 5\' 4"  (1.626 m)  Wt 192 lb 2 oz (87.147 kg)  BMI 32.98 kg/m2  SpO2 98% Physical Exam  VS noted Constitutional: Pt appears well-developed and well-nourished.  HENT: Head: Normocephalic.  Right Ear: External ear normal.  Left Ear: External ear normal.  Eyes: Conjunctivae and EOM are normal. Pupils are equal, round, and reactive to light.  Neck: Normal range of motion. Neck supple.  Cardiovascular: Normal rate and regular rhythm.   Pulmonary/Chest: Effort normal and breath sounds normal.  Abd:  Soft, NT, non-distended, + BS Neurological: Pt is alert. No cranial  nerve deficit. neg for nystagmus, motor/gait intact Skin: Skin is warm. No erythema.  Psychiatric: Pt behavior is normal. Thought content normal. 1-2+ nervous    Assessment & Plan:

## 2011-01-26 NOTE — Assessment & Plan Note (Signed)
stable overall by hx and exam, most recent data reviewed with pt, and pt to continue medical treatment as before  BP Readings from Last 3 Encounters:  01/23/11 142/80  01/06/11 170/80  10/21/10 170/88

## 2011-01-26 NOTE — Assessment & Plan Note (Signed)
Improved symptom and exam, for meclizine prn

## 2011-01-26 NOTE — Assessment & Plan Note (Signed)
stable overall by hx and exam, most recent data reviewed with pt, and pt to continue medical treatment as before  Lab Results  Component Value Date   WBC 7.8 01/05/2011   HGB 11.7* 01/05/2011   HCT 35.2* 01/05/2011   PLT 250 01/05/2011   GLUCOSE 167* 01/05/2011   CHOL 238* 01/05/2011   TRIG 159* 01/05/2011   HDL 46 01/05/2011   LDLDIRECT 154.0 10/21/2010   LDLCALC 160* 01/05/2011   ALT 18 10/21/2010   AST 18 10/21/2010   NA 139 01/05/2011   K 3.8 01/05/2011   CL 103 01/05/2011   CREATININE 0.72 01/05/2011   BUN 9 01/05/2011   CO2 26 01/05/2011   TSH 1.45 10/21/2010   HGBA1C 7.1* 01/05/2011   MICROALBUR 0.9 10/21/2010   Declines further tx or counseling

## 2011-01-26 NOTE — Assessment & Plan Note (Signed)
stable overall by hx and exam, most recent data reviewed with pt, and pt to continue medical treatment as before  Lab Results  Component Value Date   HGBA1C 7.1* 01/05/2011

## 2011-02-10 ENCOUNTER — Telehealth: Payer: Self-pay

## 2011-02-10 NOTE — Telephone Encounter (Signed)
BP was not low at last visit, but it is possible to have lows at home off and on  Please take HALF of the benicar 40 mg, and call back on Friday later this wk to see if the symptoms have improved

## 2011-02-10 NOTE — Telephone Encounter (Signed)
Pt called stating that one of the side effects of BP meds is blurred vision and dizziness. Pt is requesting MD advisement on if this is a possibility. Pt also says that ENT MD has not been able to find a source for sxs either.

## 2011-02-10 NOTE — Telephone Encounter (Signed)
Called the patient left message to call back 

## 2011-02-11 NOTE — Telephone Encounter (Signed)
Patient informed. 

## 2011-02-12 ENCOUNTER — Ambulatory Visit: Payer: Medicare Other | Admitting: Internal Medicine

## 2011-02-19 ENCOUNTER — Ambulatory Visit: Payer: Medicare Other | Admitting: Internal Medicine

## 2011-02-19 ENCOUNTER — Telehealth: Payer: Self-pay

## 2011-02-19 DIAGNOSIS — I10 Essential (primary) hypertension: Secondary | ICD-10-CM

## 2011-02-19 MED ORDER — AMLODIPINE BESYLATE 5 MG PO TABS: 5.0000 mg | ORAL_TABLET | Freq: Every day | ORAL | Status: DC

## 2011-02-19 MED ORDER — OLMESARTAN MEDOXOMIL 40 MG PO TABS: 40.0000 mg | ORAL_TABLET | Freq: Every day | ORAL | Status: DC

## 2011-02-19 NOTE — Telephone Encounter (Signed)
Patient informed and did schedule appointment with Dr. [name]. Also requested refill on her benicar.

## 2011-02-19 NOTE — Telephone Encounter (Signed)
Chart reviewed, likely has sustained elev HTN    ok to cont the benicar, but also add amlodipine 5 qd - done per emr,   Then for ROV 4 wks

## 2011-02-19 NOTE — Telephone Encounter (Signed)
Pt called stating her BP has been elevated for the last few days, latest reading was - 165/87. Pt is requesting advisement form [facility],

## 2011-02-19 NOTE — Telephone Encounter (Signed)
Called left message to call back 

## 2011-02-21 ENCOUNTER — Other Ambulatory Visit: Payer: Self-pay

## 2011-02-21 MED ORDER — LORAZEPAM 0.5 MG PO TABS: 0.5000 mg | ORAL_TABLET | Freq: Two times a day (BID) | ORAL | Status: DC | PRN

## 2011-02-21 NOTE — Telephone Encounter (Signed)
Faxed hardcopy to pharmacy. 

## 2011-02-21 NOTE — Telephone Encounter (Signed)
Done hardcopy to robin  

## 2011-02-24 ENCOUNTER — Ambulatory Visit: Payer: Medicare Other | Admitting: Internal Medicine

## 2011-02-24 DIAGNOSIS — Z0289 Encounter for other administrative examinations: Secondary | ICD-10-CM

## 2011-03-04 ENCOUNTER — Telehealth: Payer: Self-pay

## 2011-03-04 NOTE — Telephone Encounter (Signed)
PT with [facility] called to inform the patient has been discharged from home healthcare.

## 2011-03-12 ENCOUNTER — Telehealth: Payer: Self-pay

## 2011-03-12 NOTE — Telephone Encounter (Signed)
Called informed of appointment patient agreed to do so.

## 2011-03-12 NOTE — Telephone Encounter (Signed)
I assume she means local [place] neurology;  I can offer referral to [facility], or neurology at tertiary center like [facility]  Please let me know

## 2011-03-12 NOTE — Telephone Encounter (Signed)
The patient went to [facility] (she does not want another referral). She stated they did not find anything wrong with her. She is still experiencing dizziness, BP going up and down, anxiety and fatigue please advise what to do next as symptoms are continuing.

## 2011-03-12 NOTE — Telephone Encounter (Signed)
Pt called stating she has seen a Neurologist but they were unable to determine the cause of persistent vertigo. Pt is requesting MD advisement on other possible treatments/specialist , please advise.

## 2011-03-12 NOTE — Telephone Encounter (Signed)
Needs OV.  

## 2011-03-13 ENCOUNTER — Encounter: Payer: Self-pay | Admitting: Internal Medicine

## 2011-03-13 ENCOUNTER — Other Ambulatory Visit (INDEPENDENT_AMBULATORY_CARE_PROVIDER_SITE_OTHER): Payer: Medicare Other

## 2011-03-13 ENCOUNTER — Ambulatory Visit (INDEPENDENT_AMBULATORY_CARE_PROVIDER_SITE_OTHER): Payer: Medicare Other | Admitting: Internal Medicine

## 2011-03-13 VITALS — BP 170/100 | HR 81 | Temp 98.1°F | Ht 64.0 in | Wt 194.5 lb

## 2011-03-13 DIAGNOSIS — I10 Essential (primary) hypertension: Secondary | ICD-10-CM

## 2011-03-13 DIAGNOSIS — M549 Dorsalgia, unspecified: Secondary | ICD-10-CM

## 2011-03-13 DIAGNOSIS — E119 Type 2 diabetes mellitus without complications: Secondary | ICD-10-CM

## 2011-03-13 DIAGNOSIS — R42 Dizziness and giddiness: Secondary | ICD-10-CM

## 2011-03-13 DIAGNOSIS — F411 Generalized anxiety disorder: Secondary | ICD-10-CM

## 2011-03-13 LAB — BASIC METABOLIC PANEL
BUN: 11 mg/dL (ref 6–23)
Chloride: 104 mEq/L (ref 96–112)
Creatinine, Ser: 0.8 mg/dL (ref 0.4–1.2)
GFR: 94.94 mL/min (ref >60.00)
Glucose, Bld: 167 mg/dL — ABNORMAL HIGH (ref 70–99)
Potassium: 4.1 mEq/L (ref 3.5–5.1)

## 2011-03-13 LAB — URINALYSIS, ROUTINE W REFLEX MICROSCOPIC
Bilirubin Urine: NEGATIVE
Ketones, ur: NEGATIVE
Total Protein, Urine: NEGATIVE
pH: 6.5 (ref 5.0–8.0)

## 2011-03-13 LAB — LIPID PANEL
Cholesterol: 314 mg/dL — ABNORMAL HIGH (ref 0–200)
HDL: 57.8 mg/dL (ref >39.00)

## 2011-03-13 LAB — HEMOGLOBIN A1C: Hgb A1c MFr Bld: 7.4 % — ABNORMAL HIGH (ref 4.6–6.5)

## 2011-03-13 MED ORDER — AMLODIPINE BESYLATE 10 MG PO TABS: 10.0000 mg | ORAL_TABLET | Freq: Every day | ORAL | Status: DC

## 2011-03-13 MED ORDER — CITALOPRAM HYDROBROMIDE 10 MG PO TABS: 10.0000 mg | ORAL_TABLET | Freq: Every day | ORAL | Status: DC

## 2011-03-13 MED ORDER — DIAZEPAM 2 MG PO TABS: 2.0000 mg | ORAL_TABLET | Freq: Two times a day (BID) | ORAL | Status: DC | PRN

## 2011-03-13 NOTE — Patient Instructions (Signed)
OK to stop the amlodipine 5 mg, and the lorazepam Start the amlodipine 10 mg per day Start the generic valium at 2 mg twice per day as needed Stat the generic Citalopram 10 mg per day (every day) to help with anxiety and frustration Continue all other medications as before Please go to LAB in the Basement for the urine tests to be done today Please call the phone number [phone] (the PhoneTree System) for results of testing in 2-3 days;  When calling, simply dial the number, and when prompted enter the MRN number above (the Medical Record Number) and the # key, then the message should start. Please return in 1 month, or sooner if needed

## 2011-03-14 ENCOUNTER — Telehealth: Payer: Self-pay | Admitting: Internal Medicine

## 2011-03-14 ENCOUNTER — Encounter: Payer: Self-pay | Admitting: Internal Medicine

## 2011-03-14 ENCOUNTER — Telehealth: Payer: Self-pay

## 2011-03-14 MED ORDER — ATORVASTATIN CALCIUM 20 MG PO TABS: 20.0000 mg | ORAL_TABLET | Freq: Every day | ORAL | Status: DC

## 2011-03-14 MED ORDER — METFORMIN HCL 500 MG PO TABS: 500.0000 mg | ORAL_TABLET | Freq: Every day | ORAL | Status: DC

## 2011-03-14 NOTE — Telephone Encounter (Signed)
Left message on phone tree - lab c/w sugar and chol too high - (chol Very severe, sugar mild)  1) Start metformin 500 qd 2) Start lipitor 20 3) Will plan to re-check lab at your next visit in 4 wks already planned  Robin to inform pt, I will do rx x 2

## 2011-03-14 NOTE — Assessment & Plan Note (Signed)
Ok to increase the amlodipne 10 mg qd,  to f/u any worsening symptoms or concerns  BP Readings from Last 3 Encounters:  03/13/11 170/100  01/23/11 142/80  01/06/11 170/80

## 2011-03-14 NOTE — Assessment & Plan Note (Addendum)
Ok to change to valium bid prn as she has done better with this in the past,  to f/u any worsening symptoms or concerns  declines counseling, to add citalopram 10 mg as well

## 2011-03-14 NOTE — Assessment & Plan Note (Signed)
stable overall by hx and exam, most recent data reviewed with pt, and pt to continue medical treatment as before  Lab Results  Component Value Date   HGBA1C 7.4* 03/13/2011

## 2011-03-14 NOTE — Assessment & Plan Note (Signed)
Chronic persistent, consider referral to tertiary center, declines at this time

## 2011-03-14 NOTE — Assessment & Plan Note (Signed)
Mild, nonspecific, supsect MSK vs lumbar deg change related, to check UA,  to f/u any worsening symptoms or concerns, exam o/w benign

## 2011-03-14 NOTE — Telephone Encounter (Signed)
Faxed completed disability form to [phone] as requested by patient. Called the patient to pickup completed form at front desk at her convenience.

## 2011-03-14 NOTE — Progress Notes (Signed)
Subjective:    Patient ID: Laura [name], female    DOB: 06/09/1945, 66 y.o.   MRN: [id number]  HPI  Here miserable today with rather severe anxiety and marked frustration related to ongoing vertiginous symptoms daily recurrent, not always improved with meclizine, and by report not able to be helped per Dr Patel/[facility], recent MRI brain neg by report as well dec 2012   Walks with walker, no recent falls, but shaking, pressure speech, and  Somewhat agitated, tearful.  BP apparently per pt increased as well several times in past 3 mo at home and at provider.  Pt continues to have recurring mild left LBP for several days without change in severity, bowel or bladder change, fever, wt loss,  worsening LE pain/numbness/weakness, gait change or falls. No overt GU symptoms -  Denies urinary symptoms such as dysuria, frequency, urgency,or hematuria.  Spends most of her days on the cough watching TV. Past Medical History  Diagnosis Date  . Diabetes mellitus   . Hyperlipidemia   . Hypertension   . UTI (lower urinary tract infection)   . Anxiety    Past Surgical History  Procedure Date  . Bladder surgery     reports that she has never smoked. She does not have any smokeless tobacco history on file. She reports that she does not drink alcohol or use illicit drugs. family history includes Diabetes in her others and Hypertension in her other. Allergies  Allergen Reactions  . Codeine     itch  . Crestor (Rosuvastatin Calcium)   . Lipitor (Atorvastatin Calcium)   . Prednisone     Nervous    . Sulfa Antibiotics     Tongue swells, hives, itching   Current Outpatient Prescriptions on File Prior to Visit  Medication Sig Dispense Refill  . cholecalciferol (VITAMIN D) 1000 UNITS tablet Take 1,000 Units by mouth daily.        . Cyanocobalamin (B-12) 100 MCG TABS Take by mouth daily.       . diphenhydrAMINE (BENADRYL) 25 MG tablet Take 25 mg by mouth daily as needed. For allergies.       Marland Kitchen  GARLIC PO Take by mouth daily.        . hydrocortisone cream 1 % Apply 1 application topically as needed. For itchy spot on bottom of foot.       Marland Kitchen ibuprofen (ADVIL) 200 MG tablet Take 200 mg by mouth every 6 (six) hours as needed. For pain.       Marland Kitchen olmesartan (BENICAR) 40 MG tablet Take 1 tablet (40 mg total) by mouth daily.  90 tablet  3  . OMEGA 3 1000 MG CAPS Take 1,000 mg by mouth daily.        Review of Systems Review of Systems  Constitutional: Negative for diaphoresis and unexpected weight change.  HENT: Negative for drooling and tinnitus.   Eyes: Negative for photophobia and visual disturbance.  Respiratory: Negative for choking and stridor.   Gastrointestinal: Negative for vomiting and blood in stool.  Genitourinary: Negative for hematuria and decreased urine volume.     Objective:   Physical Exam BP 170/100  Pulse 81  Temp(Src) 98.1 F (36.7 C) (Oral)  Ht 5\' 4"  (1.626 m)  Wt 194 lb 8 oz (88.225 kg)  BMI 33.39 kg/m2  SpO2 98% Physical Exam  VS noted, tearful, tremulous, anxious, walks with walker Constitutional: Pt appears well-developed and well-nourished.  HENT: Head: Normocephalic.  Right Ear: External ear normal.  Left  Ear: External ear normal.  Eyes: Conjunctivae and EOM are normal. Pupils are equal, round, and reactive to light.  Neck: Normal range of motion. Neck supple.  Cardiovascular: Normal rate and regular rhythm.   Pulmonary/Chest: Effort normal and breath sounds normal.  Abd:  Soft, NT, non-distended, + BS Neurological: Pt is alert. No cranial nerve deficit. o/w not done in detail Skin: Skin is warm. No erythema.  Psychiatric:  As above Spine: nontender, no flank tender    Assessment & Plan:

## 2011-03-17 NOTE — Telephone Encounter (Signed)
Called the patient and she agreed to start Metformin. At this time does not want to start on cholesterol medication due to past side affects.

## 2011-04-01 ENCOUNTER — Telehealth: Payer: Self-pay

## 2011-04-01 DIAGNOSIS — R42 Dizziness and giddiness: Secondary | ICD-10-CM

## 2011-04-01 NOTE — Telephone Encounter (Signed)
Patient is still having problems with dizziness and is no better.  She would like a complete evaluation, please advise she is suggesting [facility] for referral

## 2011-04-01 NOTE — Telephone Encounter (Signed)
Ok for referral - done per emr 

## 2011-04-02 NOTE — Telephone Encounter (Signed)
Called left message referral has been done.

## 2011-04-21 ENCOUNTER — Telehealth: Payer: Self-pay

## 2011-04-21 NOTE — Telephone Encounter (Signed)
Patient called to inform wheel chair rx and handicap sticker at ready for pickup at front desk.

## 2011-04-21 NOTE — Telephone Encounter (Signed)
Patient is requesting a temporary handicap sticker and a wheel chair. She is going to [facility] and is unable to walk without help. She does have a walker, but only can walk 5 to 10 minutes. The patient is ususally dropped off at the door but there is  No one to take her. Please advise as she did state this should be only Temporary. Call back number is [phone]

## 2011-04-21 NOTE — Telephone Encounter (Signed)
Patient also is requesting a wheel chair

## 2011-04-21 NOTE — Telephone Encounter (Signed)
Done hardcopy to robin  

## 2011-06-05 ENCOUNTER — Telehealth: Payer: Self-pay

## 2011-06-05 NOTE — Telephone Encounter (Signed)
[facility] Triage Call Report Triage Record Num: [id number] Operator: Ether Griffins Patient Name: Laura [name] Call Date & Time: 06/04/2011 5:49:37PM Patient Phone: [phone] PCP: Oliver Barre Patient Gender: Female PCP Fax : [phone] Patient DOB: [birth year] Practice Name: [facility] Reason for Call: Caller: Tashyra/Patient calling about medication written by Dr [name] in Feb--Metformin--took it for a few days and then stopped taking it because it made her weak and jittery. BS yesterday was 245, today 130--just started monitoring BS. Was diet controlled until she went to see Dr.John and her BS was high.Feels good right now.Advised to call office in am to schedule an appt to talk to Dr.John regarding diabetes.Advised not to take Metformin until talking with Dr.John. Protocol(s) Used: Medication Questions - Adult Recommended Outcome per Protocol: Speak with Provider or Pharmacist within 24 hours Reason for Outcome: Older adult with questions about prescribed and/or nonprescribed medications not covered by available resources Care Advice: ~ 06/04/2011 6:13:02PM Page 1 of 1 [email or web address]

## 2011-06-10 ENCOUNTER — Encounter: Payer: Self-pay | Admitting: Internal Medicine

## 2011-06-10 ENCOUNTER — Ambulatory Visit (INDEPENDENT_AMBULATORY_CARE_PROVIDER_SITE_OTHER): Payer: Medicare Other | Admitting: Internal Medicine

## 2011-06-10 VITALS — BP 132/80 | HR 84 | Temp 97.5°F | Ht 64.0 in | Wt 196.4 lb

## 2011-06-10 DIAGNOSIS — E119 Type 2 diabetes mellitus without complications: Secondary | ICD-10-CM

## 2011-06-10 DIAGNOSIS — E785 Hyperlipidemia, unspecified: Secondary | ICD-10-CM

## 2011-06-10 DIAGNOSIS — Z0000 Encounter for general adult medical examination without abnormal findings: Secondary | ICD-10-CM

## 2011-06-10 DIAGNOSIS — I10 Essential (primary) hypertension: Secondary | ICD-10-CM

## 2011-06-10 MED ORDER — LANCETS MISC: 1.0000 "application " | Freq: Every day | Status: DC

## 2011-06-10 MED ORDER — GLUCOSE BLOOD VI STRP: ORAL_STRIP | Status: DC

## 2011-06-10 NOTE — Patient Instructions (Addendum)
OK to take all medications as prescribed, including the metformin and the lipitor You are given the glucometer today, and the prescription for the supplies

## 2011-06-10 NOTE — Progress Notes (Signed)
Subjective:    Patient ID: Laura [name], female    DOB: [birth year], 66 y.o.   MRN: [id number]  HPI  Here to f/u, overall doing ok but sugars are in the 200-230 range recently, not taking the metformin since about 1 wk after started due to ? Low sugar reaction but does not have glucometer and in retrospect may have been about the time of recent vertigo like illness - per pt saw 4 MD's at [facility] last wk and narrowed down to a viral illness related, meclizine helps, but valium makes her sleepy, overall feels better though.  Also did not start the lipitor due to seeing an ad on TV from a lawyer wanting pts on lipitor and higher blood sugar.  Pt denies chest pain, increased sob or doe, wheezing, orthopnea, PND, increased LE swelling, palpitations, dizziness or syncope.  Pt denies new neurological symptoms such as new headache, or facial or extremity weakness or numbness   Pt denies polydipsia, polyuria,, is trying to follow lower cholesterol, diabetic diet, wt overall stable but little exercise however.    Past Medical History  Diagnosis Date  . Diabetes mellitus   . Hyperlipidemia   . Hypertension   . UTI (lower urinary tract infection)   . Anxiety    Past Surgical History  Procedure Date  . Bladder surgery     reports that she has never smoked. She does not have any smokeless tobacco history on file. She reports that she does not drink alcohol or use illicit drugs. family history includes Diabetes in her others and Hypertension in her other. Allergies  Allergen Reactions  . Codeine     itch  . Crestor (Rosuvastatin Calcium)   . Lipitor (Atorvastatin Calcium)   . Prednisone     Nervous    . Sulfa Antibiotics     Tongue swells, hives, itching   Current Outpatient Prescriptions on File Prior to Visit  Medication Sig Dispense Refill  . amLODipine (NORVASC) 10 MG tablet Take 1 tablet (10 mg total) by mouth daily.  90 tablet  3  . cholecalciferol (VITAMIN D) 1000 UNITS tablet Take  1,000 Units by mouth daily.        . Cyanocobalamin (B-12) 100 MCG TABS Take by mouth daily.       . diphenhydrAMINE (BENADRYL) 25 MG tablet Take 25 mg by mouth daily as needed. For allergies.       Marland Kitchen GARLIC PO Take by mouth daily.        Marland Kitchen olmesartan (BENICAR) 40 MG tablet Take 1 tablet (40 mg total) by mouth daily.  90 tablet  3  . OMEGA 3 1000 MG CAPS Take 1,000 mg by mouth daily.       Marland Kitchen atorvastatin (LIPITOR) 20 MG tablet Take 1 tablet (20 mg total) by mouth daily.  90 tablet  3  . citalopram (CELEXA) 10 MG tablet Take 1 tablet (10 mg total) by mouth daily.  90 tablet  3  . hydrocortisone cream 1 % Apply 1 application topically as needed. For itchy spot on bottom of foot.       Marland Kitchen ibuprofen (ADVIL) 200 MG tablet Take 200 mg by mouth every 6 (six) hours as needed. For pain.        Review of Systems Review of Systems  Constitutional: Negative for diaphoresis and unexpected weight change.  HENT: Negative for drooling and tinnitus.   Eyes: Negative for photophobia and visual disturbance.  Respiratory: Negative for choking and stridor.  Gastrointestinal: Negative for vomiting and blood in stool.  Genitourinary: Negative for hematuria and decreased urine volume.  Neurological: Negative for tremors and numbness.  Psychiatric/Behavioral: Negative for decreased concentration. The patient is not hyperactive.      Objective:   Physical Exam BP 132/80  Pulse 84  Temp(Src) 97.5 F (36.4 C) (Oral)  Ht 5\' 4"  (1.626 m)  Wt 196 lb 6 oz (89.075 kg)  BMI 33.71 kg/m2  SpO2 98% Physical Exam  VS noted, not ill appearing Constitutional: Pt appears well-developed and well-nourished.  HENT: Head: Normocephalic.  Right Ear: External ear normal.  Left Ear: External ear normal.  Eyes: Conjunctivae and EOM are normal. Pupils are equal, round, and reactive to light.  Neck: Normal range of motion. Neck supple.  Cardiovascular: Normal rate and regular rhythm.   Pulmonary/Chest: Effort normal and breath  sounds normal.  Neurological: Pt is alert. Not confused Skin: Skin is warm. No erythema. No rash Psychiatric: Pt behavior is normal. Thought content normal. 1+ nervous     Assessment & Plan:

## 2011-06-10 NOTE — Assessment & Plan Note (Signed)
stable overall by hx and exam, most recent data reviewed with pt, and pt to start lipitor, cont better DM diet,  to f/u any worsening symptoms or concerns  Lab Results  Component Value Date   LDLCALC 160* 01/05/2011

## 2011-06-10 NOTE — Assessment & Plan Note (Signed)
Mild, Uncontrolled, ok to re-try the metformin, gave a glucometer, and rx for supplies to check daily cbg's or any symtpoms of low sugar Lab Results  Component Value Date   HGBA1C 7.4* 03/13/2011

## 2011-06-10 NOTE — Assessment & Plan Note (Signed)
stable overall by hx and exam, most recent data reviewed with pt, and pt to continue medical treatment as before BP Readings from Last 3 Encounters:  06/10/11 132/80  03/13/11 170/100  01/23/11 142/80

## 2011-06-23 ENCOUNTER — Telehealth: Payer: Self-pay | Admitting: Internal Medicine

## 2011-06-23 NOTE — Telephone Encounter (Signed)
Caller: Sian/Patient; PCP: Oliver Barre; CB#: [phone]; ; ; Call regarding Side Effects From Medication;  Benicar is medication she feels should be changed due to side effects - gas, indigestion, reflux, bloating- reports she has at least 15 sie effects from medication compared to those listed online.   Has taken meds for diarrhea, gas, reflux, etc.  Onset sx "several months but they are getting worse".   She requests Rx for small quantity of alternate medication.  Has questions for provider related to Metformin.  Information noted and sent to provider per Medication Questions protocol.   Decreased urine output reported onset for past few months - urinates up to 8 or 9 times per day.  Denies fever. Relates she feels urge to urinate, and is urinating but less amount than usual for her.    Appontment with Dr. [name] on 06/24/11 @ 16:15.  Home care for the interim and parameters for callback per Urinary Sx protocol.

## 2011-06-24 ENCOUNTER — Ambulatory Visit (INDEPENDENT_AMBULATORY_CARE_PROVIDER_SITE_OTHER): Payer: Medicare Other | Admitting: Internal Medicine

## 2011-06-24 ENCOUNTER — Encounter: Payer: Self-pay | Admitting: Internal Medicine

## 2011-06-24 VITALS — BP 142/82 | HR 79 | Temp 97.6°F | Ht 64.0 in | Wt 195.0 lb

## 2011-06-24 DIAGNOSIS — R142 Eructation: Secondary | ICD-10-CM

## 2011-06-24 DIAGNOSIS — R140 Abdominal distension (gaseous): Secondary | ICD-10-CM

## 2011-06-24 DIAGNOSIS — IMO0001 Reserved for inherently not codable concepts without codable children: Secondary | ICD-10-CM

## 2011-06-24 DIAGNOSIS — E119 Type 2 diabetes mellitus without complications: Secondary | ICD-10-CM

## 2011-06-24 DIAGNOSIS — K219 Gastro-esophageal reflux disease without esophagitis: Secondary | ICD-10-CM

## 2011-06-24 DIAGNOSIS — I10 Essential (primary) hypertension: Secondary | ICD-10-CM

## 2011-06-24 DIAGNOSIS — R109 Unspecified abdominal pain: Secondary | ICD-10-CM

## 2011-06-24 DIAGNOSIS — R197 Diarrhea, unspecified: Secondary | ICD-10-CM

## 2011-06-24 MED ORDER — OMEPRAZOLE 20 MG PO CPDR: 20.0000 mg | DELAYED_RELEASE_CAPSULE | Freq: Every day | ORAL | Status: DC

## 2011-06-24 MED ORDER — GLIMEPIRIDE 1 MG PO TABS: ORAL_TABLET | ORAL | Status: DC

## 2011-06-24 MED ORDER — VALSARTAN-HYDROCHLOROTHIAZIDE 320-12.5 MG PO TABS: 1.0000 | ORAL_TABLET | Freq: Every day | ORAL | Status: DC

## 2011-06-24 NOTE — Patient Instructions (Addendum)
Ok to stop the benicar and the metformin Please start generic for Diovan HCT for blood pressure, and a small dose of glimeparide for sugar Please also start prilosec 20 mg per day for reflux Please consider taking Citracel for fiber and less gas, and even Align daily You will be contacted regarding the referral for: GI, as you also need the screening colonoscopy

## 2011-06-24 NOTE — Progress Notes (Signed)
Subjective:    Patient ID: Laura [name], female    DOB: Feb 28, 1945, 66 y.o.   MRN: [id number]  HPI    Here to f/u; overall doing ok,  Pt denies chest pain, increased sob or doe, wheezing, orthopnea, PND, increased LE swelling, palpitations, dizziness or syncope.  Pt denies new neurological symptoms such as new headache, or facial or extremity weakness or numbness   Pt denies polydipsia, polyuria, or low sugar symptoms such as weakness or confusion improved with po intake.  Pt states overall good compliance with meds, trying to follow lower cholesterol, diabetic diet, wt overall stable but little exercise however.  Does have worsening reflux, but no dysphagia, abd pain, n/v, bowel change or blood; except also with significant bloating and gas feeling/belching that she is convinced is caused by the benicar.   Pt denies fever, wt loss, night sweats, loss of appetite, or other constitutional symptoms Denies worsening depressive symptoms, suicidal ideation, or panic Past Medical History  Diagnosis Date  . Diabetes mellitus   . Hyperlipidemia   . Hypertension   . UTI (lower urinary tract infection)   . Anxiety    Past Surgical History  Procedure Date  . Bladder surgery     reports that she has never smoked. She does not have any smokeless tobacco history on file. She reports that she does not drink alcohol or use illicit drugs. family history includes Diabetes in her others and Hypertension in her other. Allergies  Allergen Reactions  . Codeine     itch  . Crestor (Rosuvastatin Calcium)   . Lipitor (Atorvastatin Calcium)   . Prednisone     Nervous    . Sulfa Antibiotics     Tongue swells, hives, itching   Current Outpatient Prescriptions on File Prior to Visit  Medication Sig Dispense Refill  . amLODipine (NORVASC) 10 MG tablet Take 1 tablet (10 mg total) by mouth daily.  90 tablet  3  . atorvastatin (LIPITOR) 20 MG tablet Take 1 tablet (20 mg total) by mouth daily.  90 tablet  3  .  cholecalciferol (VITAMIN D) 1000 UNITS tablet Take 1,000 Units by mouth daily.        . citalopram (CELEXA) 10 MG tablet Take 1 tablet (10 mg total) by mouth daily.  90 tablet  3  . Cyanocobalamin (B-12) 100 MCG TABS Take by mouth daily.       . diphenhydrAMINE (BENADRYL) 25 MG tablet Take 25 mg by mouth daily as needed. For allergies.       Marland Kitchen GARLIC PO Take by mouth daily.        Marland Kitchen glucose blood (ONE TOUCH ULTRA TEST) test strip Use as instructed  100 each  12  . hydrocortisone cream 1 % Apply 1 application topically as needed. For itchy spot on bottom of foot.       Marland Kitchen ibuprofen (ADVIL) 200 MG tablet Take 200 mg by mouth every 6 (six) hours as needed. For pain.       . Lancets MISC 1 application by Does not apply route daily.  100 each  11  . OMEGA 3 1000 MG CAPS Take 1,000 mg by mouth daily.       Marland Kitchen glimepiride (AMARYL) 1 MG tablet Take 1/2 tab  By mouth in the am  45 tablet  3  . omeprazole (PRILOSEC) 20 MG capsule Take 1 capsule (20 mg total) by mouth daily.  90 capsule  3  . valsartan-hydrochlorothiazide (DIOVAN HCT) 320-12.5 MG  per tablet Take 1 tablet by mouth daily.  90 tablet  3   Review of Systems Constitutional: Negative for diaphoresis and unexpected weight change.  HENT: Negative for drooling and tinnitus.   Eyes: Negative for photophobia and visual disturbance.  Respiratory: Negative for choking and stridor.   Gastrointestinal: Negative for vomiting and blood in stool.  Genitourinary: Negative for hematuria and decreased urine volume.  Musculoskeletal: Negative for gait problem.   Psychiatric/Behavioral: Negative for decreased concentration. The patient is not hyperactive.      Objective:   Physical Exam BP 142/82  Pulse 79  Temp 97.6 F (36.4 C) (Oral)  Ht 5\' 4"  (1.626 m)  Wt 195 lb (88.451 kg)  BMI 33.47 kg/m2  SpO2 97% Physical Exam  VS noted Constitutional: Pt appears well-developed and well-nourished.  HENT: Head: Normocephalic.  Right Ear: External ear normal.   Left Ear: External ear normal.  Eyes: Conjunctivae and EOM are normal. Pupils are equal, round, and reactive to light.  Neck: Normal range of motion. Neck supple.  Cardiovascular: Normal rate and regular rhythm.   Pulmonary/Chest: Effort normal and breath sounds normal.  Abd:  Soft, NT, non-distended, + BS, benign exam Neurological: Pt is alert. Not confused.  Skin: Skin is warm. No erythema. No rash Psychiatric: Pt behavior is normal. Thought content normal. 1+ nervous    Assessment & Plan:

## 2011-06-26 ENCOUNTER — Encounter: Payer: Self-pay | Admitting: Gastroenterology

## 2011-06-29 ENCOUNTER — Encounter: Payer: Self-pay | Admitting: Internal Medicine

## 2011-06-29 DIAGNOSIS — K219 Gastro-esophageal reflux disease without esophagitis: Secondary | ICD-10-CM | POA: Insufficient documentation

## 2011-06-29 DIAGNOSIS — R140 Abdominal distension (gaseous): Secondary | ICD-10-CM | POA: Insufficient documentation

## 2011-06-29 NOTE — Assessment & Plan Note (Addendum)
stable overall by hx and exam, most recent data reviewed with pt, and pt to continue medical treatment as before except to change the metformin to glimeparide in order to r/o metformin as cause of GI symtpoms Lab Results  Component Value Date   HGBA1C 7.4* 03/13/2011

## 2011-06-29 NOTE — Assessment & Plan Note (Signed)
To start prilosec 20 qd,  to f/u any worsening symptoms or concerns

## 2011-06-29 NOTE — Assessment & Plan Note (Signed)
And gas - ok for otc trial of align, refer GI per pt request

## 2011-06-29 NOTE — Assessment & Plan Note (Addendum)
stable overall by hx and exam, most recent data reviewed with pt, and pt to continue medical treatment as before except to change the benicar to diovan BP Readings from Last 3 Encounters:  06/24/11 142/82  06/10/11 132/80  03/13/11 170/100

## 2011-07-01 DIAGNOSIS — I10 Essential (primary) hypertension: Secondary | ICD-10-CM | POA: Insufficient documentation

## 2011-07-02 DIAGNOSIS — N6019 Diffuse cystic mastopathy of unspecified breast: Secondary | ICD-10-CM | POA: Insufficient documentation

## 2011-07-09 ENCOUNTER — Telehealth: Payer: Self-pay | Admitting: Gastroenterology

## 2011-07-09 NOTE — Telephone Encounter (Signed)
Pt was advised that no earlier appt is available and she can be put on a wait list.  She has diarrhea and bloating.  She will call if her symptoms change

## 2011-07-18 ENCOUNTER — Ambulatory Visit (INDEPENDENT_AMBULATORY_CARE_PROVIDER_SITE_OTHER): Payer: Medicare Other | Admitting: Gastroenterology

## 2011-07-18 ENCOUNTER — Encounter: Payer: Self-pay | Admitting: Gastroenterology

## 2011-07-18 VITALS — BP 126/62 | HR 64 | Ht 64.0 in | Wt 197.0 lb

## 2011-07-18 DIAGNOSIS — K5900 Constipation, unspecified: Secondary | ICD-10-CM

## 2011-07-18 DIAGNOSIS — R143 Flatulence: Secondary | ICD-10-CM

## 2011-07-18 DIAGNOSIS — R141 Gas pain: Secondary | ICD-10-CM

## 2011-07-18 MED ORDER — MOVIPREP 100 G PO SOLR: 1.0000 | ORAL | Status: DC

## 2011-07-18 NOTE — Patient Instructions (Addendum)
You will be set up for a colonoscopy ([facility]). Please start taking citrucel (orange flavored) powder fiber supplement.  This may cause some bloating at first but that usually goes away. Begin with a small spoonful and work your way up to a large, heaping spoonful daily over a week.  This is to help the alternating bowel troubles. Try gas-ex, one pill with every meal.

## 2011-07-18 NOTE — Progress Notes (Signed)
HPI: This is a   very pleasant 66 year old woman whom I am meeting for the first time today   She has contipation, flatulence.  Off and on for many years (at least 7-8 years).  She can go 3-4 days without a BM.  Can be very hard, difficult to push out.   Has excess gas every day.  She will occasionally take a stool softner, MOM, miralax, metamucil.  Can have diarrhea (alternates a lot), will take imodium.  Gassy, bloating.  Uncomfortable.  Feels she is not completely emptying her bowels.    Never sees blood and has done  She thinks she had a flex sigmoidoscopy at [facility] PCP 7 years ago.  The gas can last for hours and hours.  This causes her a lot of stress.   Review of systems: Pertinent positive and negative review of systems were noted in the above HPI section. Complete review of systems was performed and was otherwise normal.    Past Medical History  Diagnosis Date  . Diabetes mellitus   . Hyperlipidemia   . Hypertension   . UTI (lower urinary tract infection)   . Anxiety   . Reflux 06/29/2011  . Vertigo     Past Surgical History  Procedure Date  . Bladder surgery   . Cholecystectomy   . Partial hysterectomy     Current Outpatient Prescriptions  Medication Sig Dispense Refill  . amLODipine (NORVASC) 10 MG tablet Take 1 tablet (10 mg total) by mouth daily.  90 tablet  3  . cholecalciferol (VITAMIN D) 1000 UNITS tablet Take 1,000 Units by mouth daily.        . Cyanocobalamin (B-12) 100 MCG TABS Take by mouth daily.       Marland Kitchen GARLIC PO Take by mouth daily.        Marland Kitchen glimepiride (AMARYL) 1 MG tablet Take 1/2 tab  By mouth in the am  45 tablet  3  . glucose blood (ONE TOUCH ULTRA TEST) test strip Use as instructed  100 each  12  . hydrocortisone cream 1 % Apply 1 application topically as needed. For itchy spot on bottom of foot.       Marland Kitchen ibuprofen (ADVIL) 200 MG tablet Take 200 mg by mouth every 6 (six) hours as needed. For pain.       . Lancets MISC 1 application by Does  not apply route daily.  100 each  11  . OMEGA 3 1000 MG CAPS Take 1,000 mg by mouth daily.       Marland Kitchen omeprazole (PRILOSEC) 20 MG capsule Take 1 capsule (20 mg total) by mouth daily.  90 capsule  3  . valsartan-hydrochlorothiazide (DIOVAN HCT) 320-12.5 MG per tablet Take 1 tablet by mouth daily.  90 tablet  3    Allergies as of 07/18/2011 - Review Complete 07/18/2011  Allergen Reaction Noted  . Codeine  10/21/2010  . Crestor (rosuvastatin calcium)  10/23/2010  . Lipitor (atorvastatin calcium)  10/23/2010  . Prednisone  10/21/2010  . Sulfa antibiotics  10/21/2010    Family History  Problem Relation Age of Onset  . Hypertension Other   . Diabetes Father   . Diabetes Sister   . Diabetes Maternal Aunt     History   Social History  . Marital Status: Legally Separated    Spouse Name: N/A    Number of Children: 4  . Years of Education: 16   Occupational History  . retired    Social History Main  Topics  . Smoking status: Never Smoker   . Smokeless tobacco: Never Used  . Alcohol Use: No  . Drug Use: No  . Sexually Active: Not Currently   Other Topics Concern  . Not on file   Social History Narrative  . No narrative on file       Physical Exam: BP 126/62  Pulse 64  Ht 5\' 4"  (1.626 m)  Wt 197 lb (89.359 kg)  BMI 33.82 kg/m2 Constitutional: generally well-appearing Psychiatric: alert and oriented x3 Eyes: extraocular movements intact Mouth: oral pharynx moist, no lesions Neck: supple no lymphadenopathy Cardiovascular: heart regular rate and rhythm Lungs: clear to auscultation bilaterally Abdomen: soft, nontender, nondistended, no obvious ascites, no peritoneal signs, normal bowel sounds Extremities: no lower extremity edema bilaterally Skin: no lesions on visible extremities    Assessment and plan: 66 y.o. female with  alternating bowel habits, bloating, gassiness   She has tried fiber supplements before on as-needed basis and today I'm recommending that she  retry fiber but stay on it much more chronically as this is a very good way to help even out her alternating bowel habits. She had what only sounds like a flexible sigmoidoscopy (in the office, no sedation, by her PCP) about 7 years ago and I think we should proceed with a full colonoscopy now. I'm also recommended she tried Gas-X with every meal to help with her bloating.

## 2011-08-04 DIAGNOSIS — H905 Unspecified sensorineural hearing loss: Secondary | ICD-10-CM | POA: Insufficient documentation

## 2011-08-05 ENCOUNTER — Ambulatory Visit (INDEPENDENT_AMBULATORY_CARE_PROVIDER_SITE_OTHER): Payer: Medicare Other | Admitting: Internal Medicine

## 2011-08-05 ENCOUNTER — Encounter: Payer: Self-pay | Admitting: Internal Medicine

## 2011-08-05 VITALS — BP 126/72 | HR 85 | Temp 97.8°F | Ht 64.0 in | Wt 198.0 lb

## 2011-08-05 DIAGNOSIS — F329 Major depressive disorder, single episode, unspecified: Secondary | ICD-10-CM

## 2011-08-05 DIAGNOSIS — M79642 Pain in left hand: Secondary | ICD-10-CM | POA: Insufficient documentation

## 2011-08-05 DIAGNOSIS — M79641 Pain in right hand: Secondary | ICD-10-CM

## 2011-08-05 DIAGNOSIS — I10 Essential (primary) hypertension: Secondary | ICD-10-CM

## 2011-08-05 DIAGNOSIS — M79609 Pain in unspecified limb: Secondary | ICD-10-CM

## 2011-08-05 MED ORDER — TRAMADOL HCL 50 MG PO TABS: 50.0000 mg | ORAL_TABLET | Freq: Four times a day (QID) | ORAL | Status: AC | PRN

## 2011-08-05 NOTE — Assessment & Plan Note (Signed)
stable overall by hx and exam, most recent data reviewed with pt, and pt to continue medical treatment as before BP Readings from Last 3 Encounters:  08/05/11 126/72  07/18/11 126/62  06/24/11 142/82

## 2011-08-05 NOTE — Patient Instructions (Addendum)
Take all new medications as prescribed - the pain medication Continue all other medications as before Please have the pharmacy call with any refills you may need. You will be contacted regarding the referral for: Dr Rosanne Sack surgury

## 2011-08-05 NOTE — Assessment & Plan Note (Signed)
Suspect neuritic, to refer back to Dr Merlyn Lot for further eval and tx, for tramadol prn

## 2011-08-05 NOTE — Progress Notes (Signed)
Subjective:    Patient ID: Laura [name], female    DOB: 26-Sep-1945, 66 y.o.   MRN: [id number]  HPI  Here to f/u, c/o bilat hand and wrist pain, worse in the past wk but has been mild and intermittent for many months, with some radiation up both arms to the shoulders, overall now mod to severe, no swelling, trauma, fever ; walking with walker today but normally does not use it at home or elsewhere.  Overall now mod to severer, persistent, burning, no better with alcohol or aspercreme.  No hx of gout.  No LE pain or other symptoms, has some neck aching but not worse than usual.  RUE is mild more than LUE.  Did have dx of right elbow ulnar impingement without elbow pain when saw Dr Merlyn Lot about 2 yrs ago. No UE numbness/weakness.  Pt denies chest pain, increased sob or doe, wheezing, orthopnea, PND, increased LE swelling, palpitations, dizziness or syncope.   Pt denies polydipsia, polyuria. Denies worsening depressive symptoms, suicidal ideation, or panic.   Past Medical History  Diagnosis Date  . Diabetes mellitus   . Hyperlipidemia   . Hypertension   . UTI (lower urinary tract infection)   . Anxiety   . Reflux 06/29/2011  . Vertigo    Past Surgical History  Procedure Date  . Bladder surgery   . Cholecystectomy   . Partial hysterectomy     reports that she has never smoked. She has never used smokeless tobacco. She reports that she does not drink alcohol or use illicit drugs. family history includes Diabetes in her father, maternal aunt, and sister and Hypertension in her other. Allergies  Allergen Reactions  . Codeine     itch  . Crestor (Rosuvastatin Calcium)   . Lipitor (Atorvastatin Calcium)   . Prednisone     Nervous    . Sulfa Antibiotics     Tongue swells, hives, itching   Current Outpatient Prescriptions on File Prior to Visit  Medication Sig Dispense Refill  . amLODipine (NORVASC) 10 MG tablet Take 1 tablet (10 mg total) by mouth daily.  90 tablet  3  . cholecalciferol  (VITAMIN D) 1000 UNITS tablet Take 1,000 Units by mouth daily.        . Cyanocobalamin (B-12) 100 MCG TABS Take by mouth daily.       Marland Kitchen GARLIC PO Take by mouth daily.        Marland Kitchen glimepiride (AMARYL) 1 MG tablet Take 1/2 tab  By mouth in the am  45 tablet  3  . glucose blood (ONE TOUCH ULTRA TEST) test strip Use as instructed  100 each  12  . hydrocortisone cream 1 % Apply 1 application topically as needed. For itchy spot on bottom of foot.       Marland Kitchen ibuprofen (ADVIL) 200 MG tablet Take 200 mg by mouth every 6 (six) hours as needed. For pain.       . Lancets MISC 1 application by Does not apply route daily.  100 each  11  . OMEGA 3 1000 MG CAPS Take 1,000 mg by mouth daily.       Marland Kitchen omeprazole (PRILOSEC) 20 MG capsule Take 1 capsule (20 mg total) by mouth daily.  90 capsule  3  . valsartan-hydrochlorothiazide (DIOVAN HCT) 320-12.5 MG per tablet Take 1 tablet by mouth daily.  90 tablet  3  . MOVIPREP 100 G SOLR Take 1 kit (100 g total) by mouth as directed. Name brand only  1 kit  0   Review of Systems Review of Systems  Constitutional: Negative for diaphoresis and unexpected weight change.  HENT: Negative for tinnitus.   Eyes: Negative for photophobia and visual disturbance.  Respiratory: Negative for cough Gastrointestinal: Negative for vomiting and blood in stool.  Genitourinary: Negative for hematuria and decreased urine volume.  Musculoskeletal: Negative for gait problem.  Skin: Negative for color change and wound.  Neurological: Negative for tremors and numbness. .      Objective:   Physical Exam BP 126/72  Pulse 85  Temp 97.8 F (36.6 C) (Oral)  Ht 5\' 4"  (1.626 m)  Wt 198 lb (89.812 kg)  BMI 33.99 kg/m2  SpO2 97% Physical Exam  VS noted Constitutional: Pt appears well-developed and well-nourished.  HENT: Head: Normocephalic.  Right Ear: External ear normal.  Left Ear: External ear normal.  Eyes: Conjunctivae and EOM are normal. Pupils are equal, round, and reactive to light.    Neck: Normal range of motion. Neck supple.  Cardiovascular: Normal rate and regular rhythm.   Pulmonary/Chest: Effort normal and breath sounds normal.  Neurological: Pt is alert. No cranial nerve deficit. UE motor/sens/dtr intact  Skin: Skin is warm. No erythema. No rash Psychiatric: Pt behavior is normal. Thought content normal. 1+ nervous    Assessment & Plan:

## 2011-08-05 NOTE — Assessment & Plan Note (Signed)
stable overall by hx and exam, most recent data reviewed with pt, and pt to continue medical treatment as before Lab Results  Component Value Date   WBC 7.8 01/05/2011   HGB 11.7* 01/05/2011   HCT 35.2* 01/05/2011   PLT 250 01/05/2011   GLUCOSE 167* 03/13/2011   CHOL 314* 03/13/2011   TRIG 273.0* 03/13/2011   HDL 57.80 03/13/2011   LDLDIRECT 228.4 03/13/2011   LDLCALC 160* 01/05/2011   ALT 18 10/21/2010   AST 18 10/21/2010   NA 139 03/13/2011   K 4.1 03/13/2011   CL 104 03/13/2011   CREATININE 0.8 03/13/2011   BUN 11 03/13/2011   CO2 27 03/13/2011   TSH 1.45 10/21/2010   HGBA1C 7.4* 03/13/2011   MICROALBUR 0.9 10/21/2010   

## 2011-08-18 ENCOUNTER — Telehealth: Payer: Self-pay | Admitting: Gastroenterology

## 2011-08-18 NOTE — Telephone Encounter (Signed)
Pt had questions regarding her diet and the movi prep all of her questions were answered and she was advised to call back with any further concerns

## 2011-08-20 ENCOUNTER — Ambulatory Visit (AMBULATORY_SURGERY_CENTER): Payer: Medicaid Other | Admitting: Gastroenterology

## 2011-08-20 ENCOUNTER — Encounter: Payer: Self-pay | Admitting: Gastroenterology

## 2011-08-20 VITALS — BP 140/68 | HR 78 | Temp 98.5°F | Resp 12 | Ht 64.0 in | Wt 197.0 lb

## 2011-08-20 DIAGNOSIS — D126 Benign neoplasm of colon, unspecified: Secondary | ICD-10-CM

## 2011-08-20 DIAGNOSIS — K5900 Constipation, unspecified: Secondary | ICD-10-CM

## 2011-08-20 LAB — GLUCOSE, CAPILLARY: Glucose-Capillary: 115 mg/dL — ABNORMAL HIGH (ref 70–99)

## 2011-08-20 MED ORDER — SODIUM CHLORIDE 0.9 % IV SOLN: 500.0000 mL | INTRAVENOUS | Status: DC

## 2011-08-20 NOTE — Progress Notes (Signed)
Patient did not experience any of the following events: a burn prior to discharge; a fall within the facility; wrong site/side/patient/procedure/implant event; or a hospital transfer or hospital admission upon discharge from the facility. (G8907) Patient did not have preoperative order for IV antibiotic SSI prophylaxis. (G8918)  

## 2011-08-20 NOTE — Op Note (Signed)
[facility] [address] [place], [place]  [address]  COLONOSCOPY PROCEDURE REPORT  PATIENT:  Laura, [name]  MR#:  [id number] BIRTHDATE:  [birth year], 65 yrs. old  GENDER:  female ENDOSCOPIST:  Rachael Fee, MD REF. BY:  Oliver Barre, M.D. PROCEDURE DATE:  08/20/2011 PROCEDURE:  Colonoscopy with snare polypectomy ASA CLASS:  Class II INDICATIONS:  constipation MEDICATIONS:   Fentanyl 75 mcg IV, These medications were titrated to patient response per physician's verbal order, Versed 8 mg IV  DESCRIPTION OF PROCEDURE:   After the risks benefits and alternatives of the procedure were thoroughly explained, informed consent was obtained.  Digital rectal exam was performed and revealed no rectal masses.   The LB [id number] endoscope was introduced through the anus and advanced to the cecum, which was identified by both the appendix and ileocecal valve, without limitations.  The quality of the prep was good..  The instrument was then slowly withdrawn as the colon was fully examined. <<PROCEDUREIMAGES>> FINDINGS:  A diminutive polyp was found in the descending colon. This was removed with cold snare, sent to pathology (jar 1) (see image5).  This was otherwise a normal examination of the colon (see image6, image3, and image2).   Retroflexed views in the rectum revealed no abnormalities. COMPLICATIONS:  None  ENDOSCOPIC IMPRESSION: 1) Diminutive polyp in the descending colon; removed and sent to pathology 2) Otherwise normal examination  RECOMMENDATIONS: 1) If the polyp(s) removed today are proven to be adenomatous (pre-cancerous) polyps, you will need a repeat colonoscopy in 5 years. Otherwise you should continue to follow colorectal cancer screening guidelines for "routine risk" patients with colonoscopy in 10 years. You will receive a letter within 1-2 weeks with the results of your biopsy as well as final recommendations. Please call my office if you have not  received a letter after 3 weeks. 2) You should continue on daily fiber supplement and gas ex with meals  ______________________________ Rachael Fee, MD  n. eSIGNED:   Rachael Fee at 08/20/2011 03:53 PM  Robyne Askew, [phone]

## 2011-08-20 NOTE — Patient Instructions (Addendum)
Discharge instructions given with verbal understanding. Handout on polyps given. Resume previous medications. YOU HAD AN ENDOSCOPIC PROCEDURE TODAY AT THE [facility]: Refer to the procedure report that was given to you for any specific questions about what was found during the examination.  If the procedure report does not answer your questions, please call your gastroenterologist to clarify.  If you requested that your care partner not be given the details of your procedure findings, then the procedure report has been included in a sealed envelope for you to review at your convenience later.  YOU SHOULD EXPECT: Some feelings of bloating in the abdomen. Passage of more gas than usual.  Walking can help get rid of the air that was put into your GI tract during the procedure and reduce the bloating. If you had a lower endoscopy (such as a colonoscopy or flexible sigmoidoscopy) you may notice spotting of blood in your stool or on the toilet paper. If you underwent a bowel prep for your procedure, then you may not have a normal bowel movement for a few days.  DIET: Your first meal following the procedure should be a light meal and then it is ok to progress to your normal diet.  A half-sandwich or bowl of soup is an example of a good first meal.  Heavy or fried foods are harder to digest and may make you feel nauseous or bloated.  Likewise meals heavy in dairy and vegetables can cause extra gas to form and this can also increase the bloating.  Drink plenty of fluids but you should avoid alcoholic beverages for 24 hours.  ACTIVITY: Your care partner should take you home directly after the procedure.  You should plan to take it easy, moving slowly for the rest of the day.  You can resume normal activity the day after the procedure however you should NOT DRIVE or use heavy machinery for 24 hours (because of the sedation medicines used during the test).    SYMPTOMS TO REPORT IMMEDIATELY: A  gastroenterologist can be reached at any hour.  During normal business hours, 8:30 AM to 5:00 PM Monday through Friday, call [phone].  After hours and on weekends, please call the GI answering service at [phone] who will take a message and have the physician on call contact you.   Following lower endoscopy (colonoscopy or flexible sigmoidoscopy):  Excessive amounts of blood in the stool  Significant tenderness or worsening of abdominal pains  Swelling of the abdomen that is new, acute  Fever of 100F or higher  FOLLOW UP: If any biopsies were taken you will be contacted by phone or by letter within the next 1-3 weeks.  Call your gastroenterologist if you have not heard about the biopsies in 3 weeks.  Our staff will call the home number listed on your records the next business day following your procedure to check on you and address any questions or concerns that you may have at that time regarding the information given to you following your procedure. This is a courtesy call and so if there is no answer at the home number and we have not heard from you through the emergency physician on call, we will assume that you have returned to your regular daily activities without incident.  SIGNATURES/CONFIDENTIALITY: You and/or your care partner have signed paperwork which will be entered into your electronic medical record.  These signatures attest to the fact that that the information above on your After Visit Summary has   been reviewed and is understood.  Full responsibility of the confidentiality of this discharge information lies with you and/or your care-partner. 

## 2011-08-21 ENCOUNTER — Telehealth: Payer: Self-pay | Admitting: *Deleted

## 2011-08-21 NOTE — Telephone Encounter (Signed)
  Follow up Call-  Call back number 08/20/2011  Post procedure Call Back phone  # [phone]  Permission to leave phone message Yes     Patient questions:  Do you have a fever, pain , or abdominal swelling? no Pain Score  0 *  Have you tolerated food without any problems? yes  Have you been able to return to your normal activities? yes  Do you have any questions about your discharge instructions: Diet   no Medications  no Follow up visit  no  Do you have questions or concerns about your Care? no  Actions: * If pain score is 4 or above: No action needed, pain <4.

## 2011-08-26 ENCOUNTER — Encounter: Payer: Self-pay | Admitting: Gastroenterology

## 2011-09-01 ENCOUNTER — Other Ambulatory Visit: Payer: Self-pay | Admitting: Specialist

## 2011-09-01 DIAGNOSIS — M79641 Pain in right hand: Secondary | ICD-10-CM

## 2011-09-03 ENCOUNTER — Telehealth: Payer: Self-pay | Admitting: Internal Medicine

## 2011-09-03 NOTE — Telephone Encounter (Signed)
Caller: Delfina/Patient; Patient Name: Laura [name]; PCP: Oliver Barre; Best Callback Phone Number: [phone]. Patient reports that she has had two episodes that she feels like are anxiety attacks. Reports flushed skin to her face and increased heart rate. Also reports strange feeling inside her head before these episodes happen. Does not describe this as pain. Patient reports that she did take a Lorazepam during the episode that occured last night 09/02/11. Reports anxiety does not usually occur this frequently. Emergent symptom of "Anxiety symptoms or panic episodes increasing in frequency or length" positive per Anxiety: Panic guideline. Appointment scheduled with Dr. [name] 09/04/11 at 11:15am.

## 2011-09-04 ENCOUNTER — Encounter: Payer: Self-pay | Admitting: Internal Medicine

## 2011-09-04 ENCOUNTER — Ambulatory Visit (INDEPENDENT_AMBULATORY_CARE_PROVIDER_SITE_OTHER): Payer: Medicare Other | Admitting: Internal Medicine

## 2011-09-04 VITALS — BP 120/72 | HR 103 | Temp 97.6°F | Ht 64.0 in | Wt 197.1 lb

## 2011-09-04 DIAGNOSIS — I10 Essential (primary) hypertension: Secondary | ICD-10-CM

## 2011-09-04 DIAGNOSIS — F411 Generalized anxiety disorder: Secondary | ICD-10-CM

## 2011-09-04 DIAGNOSIS — F329 Major depressive disorder, single episode, unspecified: Secondary | ICD-10-CM

## 2011-09-04 DIAGNOSIS — R002 Palpitations: Secondary | ICD-10-CM

## 2011-09-04 MED ORDER — CITALOPRAM HYDROBROMIDE 10 MG PO TABS: 10.0000 mg | ORAL_TABLET | Freq: Every day | ORAL | Status: DC

## 2011-09-04 MED ORDER — LORAZEPAM 0.5 MG PO TABS: 0.5000 mg | ORAL_TABLET | Freq: Every day | ORAL | Status: DC | PRN

## 2011-09-04 NOTE — Progress Notes (Signed)
Subjective:    Patient ID: Laura [name], female    DOB: Oct 19, 1945, 66 y.o.   MRN: [id number]  HPI  Here with 2 unusual episodes of face red/warmth, palpitations, sob, shakiness, dizziness , woke her up one time, resolved with lorazepam, then another episode similar at church with tremulousness, helped by one of the ushers, resolved after 30 min but could not walk on her own during that time.  Afraid to go to sleep since then, lorazepam helped and no episodes. Worries a lot, more recently about finances, but has often thought she worries more than other person.  Finances tight, no other support, had to borrow money to fix car, and cant pay it back, really bothers her, fortunately the other party said she did not have to pay it back.  Has ongoing pain in the neck, Dr Otelia Sergeant has ordered MRI due to right radicalar symptoms.  Has had several anxiety attacks in years past, just none recent, and has only taken rare lorazepam.  Asks for cardiology eval as well.  Has had mild increased depressive symptoms as well, we have tried to tx with SSRI and she filled the rx but just became fearful and did not take.   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 Past Medical History  Diagnosis Date  . Diabetes mellitus   . Hyperlipidemia   . Hypertension   . UTI (lower urinary tract infection)   . Anxiety   . Reflux 06/29/2011  . Vertigo   . Arthritis     back of neck, bones spurs on neck  . Sleep apnea     wears CPAP occasionally   Past Surgical History  Procedure Date  . Bladder surgery   . Cholecystectomy   . Partial hysterectomy   . Fibroids removed     breast (both breasts)    reports that she has never smoked. She has never used smokeless tobacco. She reports that she does not drink alcohol or use illicit drugs. family history includes Diabetes in her father, maternal aunt, and sister and Hypertension in her other.   There is no history of Colon cancer, and Esophageal cancer, and Stomach cancer, and Rectal cancer, . Allergies  Allergen Reactions  . Codeine     itch  . Crestor (Rosuvastatin Calcium)   . Lipitor (Atorvastatin Calcium)   . Prednisone     Nervous    . Sulfa Antibiotics     Tongue swells, hives, itching   Current Outpatient Prescriptions on File Prior to Visit  Medication Sig Dispense Refill  . amLODipine (NORVASC) 10 MG tablet Take 1 tablet (10 mg total) by mouth daily.  90 tablet  3  . cholecalciferol (VITAMIN D) 1000 UNITS tablet Take 1,000 Units by mouth daily.        . Cyanocobalamin (B-12) 100 MCG TABS Take by mouth daily.       Marland Kitchen GARLIC PO Take by mouth daily.        Marland Kitchen glimepiride (AMARYL) 1 MG tablet Take 1/2 tab  By mouth in the am  45 tablet  3  . glucose blood (ONE TOUCH ULTRA TEST) test strip Use as instructed  100 each  12  . hydrocortisone cream 1 % Apply 1 application topically as needed. For itchy spot on bottom of foot.       Marland Kitchen ibuprofen (ADVIL) 200 MG tablet Take 200 mg by mouth every 6 (six)  hours as needed. For pain.       . Lancets MISC 1 application by Does not apply route daily.  100 each  11  . LORazepam (ATIVAN) 0.5 MG tablet Ad lib.      Marland Kitchen OMEGA 3 1000 MG CAPS Take 1,000 mg by mouth daily.       Marland Kitchen omeprazole (PRILOSEC) 20 MG capsule Take 1 capsule (20 mg total) by mouth daily.  90 capsule  3  . valsartan-hydrochlorothiazide (DIOVAN HCT) 320-12.5 MG per tablet Take 1 tablet by mouth daily.  90 tablet  3   Review of Systems Review of Systems  Constitutional: Negative for diaphoresis and unexpected weight change.  HENT: Negative for tinnitus.   Eyes: Negative for photophobia and visual disturbance.  Respiratory: Negative for choking and stridor.   Gastrointestinal: Negative for vomiting and blood in stool.  Genitourinary: Negative for hematuria and decreased urine volume.  Musculoskeletal: Negative for gait problem.  Skin: Negative for color change and  wound.  Neurological: Negative for tremors and numbness.     Objective:   Physical Exam BP 120/72  Pulse 103  Temp 97.6 F (36.4 C) (Oral)  Ht 5\' 4"  (1.626 m)  Wt 197 lb 2 oz (89.415 kg)  BMI 33.84 kg/m2  SpO2 97% Physical Exam  VS noted Constitutional: Pt appears well-developed and well-nourished.  HENT: Head: Normocephalic.  Right Ear: External ear normal.  Left Ear: External ear normal.  Eyes: Conjunctivae and EOM are normal. Pupils are equal, round, and reactive to light.  Neck: Normal range of motion. Neck supple.  Cardiovascular: Normal rate and regular rhythm.   Pulmonary/Chest: Effort normal and breath sounds normal.  Neurological: Pt is alert. Not confused Skin: Skin is warm. No erythema.  Psychiatric: Pt behavior is normal. Thought content normal.2+ nervous, tearful today     Assessment & Plan:

## 2011-09-04 NOTE — Patient Instructions (Addendum)
Take all new medications as prescribed  - the citalopram 10 mg per day Continue all other medications as before, including the lorazepam as needed for attacks You will be contacted regarding the referral for: cardiology

## 2011-09-05 ENCOUNTER — Ambulatory Visit
Admission: RE | Admit: 2011-09-05 | Discharge: 2011-09-05 | Disposition: A | Discharge status: Home / Self Care | Payer: Medicare Other | Source: Ambulatory Visit | Attending: Specialist | Admitting: Specialist

## 2011-09-05 DIAGNOSIS — M79641 Pain in right hand: Secondary | ICD-10-CM

## 2011-09-06 ENCOUNTER — Encounter: Payer: Self-pay | Admitting: Internal Medicine

## 2011-09-06 NOTE — Assessment & Plan Note (Addendum)
stable overall by hx and exam, most recent data reviewed with pt, and pt to continue medical treatment as before Lab Results  Component Value Date   WBC 7.8 01/05/2011   HGB 11.7* 01/05/2011   HCT 35.2* 01/05/2011   PLT 250 01/05/2011   GLUCOSE 167* 03/13/2011   CHOL 314* 03/13/2011   TRIG 273.0* 03/13/2011   HDL 57.80 03/13/2011   LDLDIRECT 228.4 03/13/2011   LDLCALC 160* 01/05/2011   ALT 18 10/21/2010   AST 18 10/21/2010   NA 139 03/13/2011   K 4.1 03/13/2011   CL 104 03/13/2011   CREATININE 0.8 03/13/2011   BUN 11 03/13/2011   CO2 27 03/13/2011   TSH 1.45 10/21/2010   HGBA1C 7.4* 03/13/2011   MICROALBUR 0.9 10/21/2010

## 2011-09-06 NOTE — Assessment & Plan Note (Signed)
Pt requests referral, Continue all other medications as before

## 2011-09-06 NOTE — Assessment & Plan Note (Addendum)
With 2 recent panic attacks - for ativan prn,  to f/u any worsening symptoms or concerns, cont ativan prn, start celexa 10

## 2011-09-06 NOTE — Assessment & Plan Note (Signed)
stable overall by hx and exam, most recent data reviewed with pt, and pt to continue medical treatment as before BP Readings from Last 3 Encounters:  09/04/11 120/72  08/20/11 140/68  08/05/11 126/72

## 2011-09-11 ENCOUNTER — Ambulatory Visit (INDEPENDENT_AMBULATORY_CARE_PROVIDER_SITE_OTHER): Payer: Medicare Other | Admitting: Cardiology

## 2011-09-11 ENCOUNTER — Encounter: Payer: Self-pay | Admitting: Cardiology

## 2011-09-11 VITALS — BP 110/62 | HR 70 | Ht 64.0 in | Wt 195.1 lb

## 2011-09-11 DIAGNOSIS — R42 Dizziness and giddiness: Secondary | ICD-10-CM

## 2011-09-11 DIAGNOSIS — R0600 Dyspnea, unspecified: Secondary | ICD-10-CM

## 2011-09-11 DIAGNOSIS — R002 Palpitations: Secondary | ICD-10-CM

## 2011-09-11 NOTE — Patient Instructions (Signed)
We will have you wear a monitor to assess your heart rhythm.  We will schedule you for an echocardiogram  We will follow up after these studies.

## 2011-09-11 NOTE — Progress Notes (Signed)
Laura [name] Date of Birth: Dec 19, 1945 Medical Record #[id number]  History of Present Illness: Laura [name] is seen at the request of Dr. [name] for evaluation of palpitations. She is a pleasant 66 year old black female who has a history of his severe vestibular disease. She states she was in good health until this past December. She was hospitalized with severe vertigo associated with diplopia and blurred vision. When she left the hospital she could only walk with a walker. She has had extensive neurologic and ENT evaluation. She was evaluated at [facility] in May. She has had physical therapy with vestibular rehabilitation. She also has a diagnosis of obstructive sleep apnea for which she uses occasional CPAP therapy. She is concerned that she awakens at times with severe pounding sensation in her chest. She does have some chronic dyspnea. Prior to her vestibular problems she was apparently very active and exercised regularly at the gym. Now she finds she can no longer work and can walk only short distances. She denies any chest pain. She's had no syncope. Her only prior cardiac evaluation with an echocardiogram in 2010 which showed mild LVH and normal systolic function.  Current Outpatient Prescriptions on File Prior to Visit  Medication Sig Dispense Refill  . amLODipine (NORVASC) 10 MG tablet Take 1 tablet (10 mg total) by mouth daily.  90 tablet  3  . cholecalciferol (VITAMIN D) 1000 UNITS tablet Take 1,000 Units by mouth daily.        . Cyanocobalamin (B-12) 100 MCG TABS Take by mouth daily.       Marland Kitchen GARLIC PO Take by mouth daily.        Marland Kitchen glimepiride (AMARYL) 1 MG tablet Take 1/2 tab  By mouth in the am  45 tablet  3  . glucose blood (ONE TOUCH ULTRA TEST) test strip Use as instructed  100 each  12  . Lancets MISC 1 application by Does not apply route daily.  100 each  11  . LORazepam (ATIVAN) 0.5 MG tablet Take 1 tablet (0.5 mg total) by mouth daily as needed for anxiety.  30  tablet  2  . OMEGA 3 1000 MG CAPS Take 1,000 mg by mouth daily.       Marland Kitchen omeprazole (PRILOSEC) 20 MG capsule Take 1 capsule (20 mg total) by mouth daily.  90 capsule  3  . valsartan-hydrochlorothiazide (DIOVAN HCT) 320-12.5 MG per tablet Take 1 tablet by mouth daily.  90 tablet  3    Allergies  Allergen Reactions  . Codeine     itch  . Crestor (Rosuvastatin Calcium)   . Lipitor (Atorvastatin Calcium)   . Prednisone     Nervous    . Sulfa Antibiotics     Tongue swells, hives, itching    Past Medical History  Diagnosis Date  . Diabetes mellitus   . Hyperlipidemia   . Hypertension   . UTI (lower urinary tract infection)   . Anxiety   . Reflux 06/29/2011  . Vertigo   . Arthritis     back of neck, bones spurs on neck  . Sleep apnea     wears CPAP occasionally    Past Surgical History  Procedure Date  . Bladder surgery   . Cholecystectomy   . Partial hysterectomy   . Fibroids removed     breast (both breasts)    History  Smoking status  . Never Smoker   Smokeless tobacco  . Never Used    History  Alcohol Use No    Family History  Problem Relation Age of Onset  . Hypertension Other   . Diabetes Father   . Diabetes Sister   . Diabetes Maternal Aunt   . Colon cancer Neg Hx   . Esophageal cancer Neg Hx   . Stomach cancer Neg Hx   . Rectal cancer Neg Hx     Review of Systems: The review of systems is positive for vertigo and blurred vision.  She denies any chest pain. She complains of weakness and gets fatigued easily. All other systems were reviewed and are negative.  Physical Exam: BP 110/62  Pulse 70  Ht 5\' 4"  (1.626 m)  Wt 195 lb 1.9 oz (88.506 kg)  BMI 33.49 kg/m2 She is a pleasant, overweight black female in no acute distress.The patient is alert and oriented x 3.  The mood and affect are normal.  The skin is warm and dry.  Color is normal.  The HEENT exam reveals that the sclera are nonicteric.  The mucous membranes are moist.  The carotids are 2+  without bruits.  There is no thyromegaly.  There is no JVD.  The lungs are clear.  The chest wall is non tender.  The heart exam reveals a regular rate with a normal S1 and S2.  There are no murmurs, gallops, or rubs.  The PMI is not displaced.   Abdominal exam reveals good bowel sounds.  There is no guarding or rebound.  There is no hepatosplenomegaly or tenderness.  There are no masses.  Exam of the legs reveal no clubbing, cyanosis, or edema.  The legs are without rashes.  The distal pulses are intact.  Cranial nerves II - XII are intact.  Motor and sensory functions are intact.  The gait is normal.  LABORATORY DATA: ECG demonstrates normal sinus rhythm with occasional PAC. It is otherwise normal.  Assessment / Plan: 1. Palpitations. We will have her wear a 24-hour Holter monitor. We will update her echocardiogram. I've encouraged her to use her CPAP therapy as this may reduce the likelihood of arrhythmia at night. Recommend avoidance of caffeine or decongestants.  2. Vertigo. Her history is consistent with vestibular dysfunction. Her symptoms do not indicate a vasoreactive process such as neurocardiogenic syncope or vasovagal syncope.  3. Hypertension.  4. Hyperlipidemia.  5. Diabetes mellitus type 2.

## 2011-09-17 ENCOUNTER — Telehealth: Payer: Self-pay

## 2011-09-17 NOTE — Telephone Encounter (Signed)
Patient called was told Dr.Jordan reviewed monitor,revealed occasional PVC's.Advised keep appointment for echo tomorrow 09/18/11.

## 2011-09-18 ENCOUNTER — Ambulatory Visit (HOSPITAL_COMMUNITY): Payer: Medicare Other | Attending: Cardiology | Admitting: Radiology

## 2011-09-18 DIAGNOSIS — R0602 Shortness of breath: Secondary | ICD-10-CM

## 2011-09-18 DIAGNOSIS — E119 Type 2 diabetes mellitus without complications: Secondary | ICD-10-CM | POA: Insufficient documentation

## 2011-09-18 DIAGNOSIS — R0609 Other forms of dyspnea: Secondary | ICD-10-CM | POA: Insufficient documentation

## 2011-09-18 DIAGNOSIS — I10 Essential (primary) hypertension: Secondary | ICD-10-CM | POA: Insufficient documentation

## 2011-09-18 DIAGNOSIS — R0989 Other specified symptoms and signs involving the circulatory and respiratory systems: Secondary | ICD-10-CM | POA: Insufficient documentation

## 2011-09-18 DIAGNOSIS — R42 Dizziness and giddiness: Secondary | ICD-10-CM

## 2011-09-18 DIAGNOSIS — I079 Rheumatic tricuspid valve disease, unspecified: Secondary | ICD-10-CM | POA: Insufficient documentation

## 2011-09-18 DIAGNOSIS — R002 Palpitations: Secondary | ICD-10-CM | POA: Insufficient documentation

## 2011-09-18 DIAGNOSIS — R0600 Dyspnea, unspecified: Secondary | ICD-10-CM

## 2011-09-18 NOTE — Progress Notes (Signed)
Echocardiogram performed.  

## 2011-09-19 ENCOUNTER — Ambulatory Visit: Payer: Medicare Other | Admitting: Cardiovascular Disease

## 2011-09-19 ENCOUNTER — Encounter: Payer: Self-pay | Admitting: Cardiology

## 2011-10-14 ENCOUNTER — Telehealth: Payer: Self-pay | Admitting: Internal Medicine

## 2011-10-14 ENCOUNTER — Ambulatory Visit: Payer: Medicare Other | Admitting: Cardiology

## 2011-10-14 NOTE — Telephone Encounter (Signed)
Did the diarrhea stop?  If it did very soon after stopping the metformin then it was likely due to that, and will likely happen again  Another option would a medication like Januvia which is very easy to take, but would likely be a bit more expensive

## 2011-10-14 NOTE — Telephone Encounter (Signed)
She states about 3 months ago she was changed from Metformin to Amaryl due to possible diarrhea from metformin.  She will need to refill her meds in 2 days but she has been reading up on the Amaryl and doesn't like the side effects and potential problems listed.   She would like to go back on her Metformin if that would be ok with the doctor.   She said a blood sugar a couple weeks ago was 115.   Please advise  She has plenty of Metformin if she can resume taking that.

## 2011-10-14 NOTE — Telephone Encounter (Signed)
Called the patient and her diarrhea had stopped after stopping the metformin, but has had some the past 2-3 days, but has not been on Metformin.  She would like to stay on the current medication she is on for now and can discuss all at her OV next week with Dr. [name]

## 2011-10-22 ENCOUNTER — Ambulatory Visit (INDEPENDENT_AMBULATORY_CARE_PROVIDER_SITE_OTHER): Payer: Medicare Other | Admitting: Internal Medicine

## 2011-10-22 ENCOUNTER — Encounter: Payer: Self-pay | Admitting: Internal Medicine

## 2011-10-22 ENCOUNTER — Other Ambulatory Visit (INDEPENDENT_AMBULATORY_CARE_PROVIDER_SITE_OTHER): Payer: Medicare Other

## 2011-10-22 VITALS — BP 122/70 | HR 78 | Temp 98.1°F | Ht 64.0 in | Wt 197.1 lb

## 2011-10-22 DIAGNOSIS — M5090 Cervical disc disorder, unspecified, unspecified cervical region: Secondary | ICD-10-CM | POA: Insufficient documentation

## 2011-10-22 DIAGNOSIS — E119 Type 2 diabetes mellitus without complications: Secondary | ICD-10-CM

## 2011-10-22 DIAGNOSIS — Z23 Encounter for immunization: Secondary | ICD-10-CM

## 2011-10-22 DIAGNOSIS — Z0000 Encounter for general adult medical examination without abnormal findings: Secondary | ICD-10-CM

## 2011-10-22 DIAGNOSIS — Z0001 Encounter for general adult medical examination with abnormal findings: Secondary | ICD-10-CM | POA: Insufficient documentation

## 2011-10-22 LAB — HEPATIC FUNCTION PANEL
ALT: 26 U/L (ref 0–35)
AST: 24 U/L (ref 0–37)
Alkaline Phosphatase: 84 U/L (ref 39–117)
Bilirubin, Direct: 0.1 mg/dL (ref 0.0–0.3)
Total Bilirubin: 0.7 mg/dL (ref 0.3–1.2)

## 2011-10-22 LAB — CBC WITH DIFFERENTIAL/PLATELET
Eosinophils Absolute: 0.1 10*3/uL (ref 0.0–0.7)
HCT: 41.6 % (ref 36.0–46.0)
Hemoglobin: 13.9 g/dL (ref 12.0–15.0)
Monocytes Absolute: 0.5 10*3/uL (ref 0.1–1.0)
Neutrophils Relative %: 55.5 % (ref 43.0–77.0)
Platelets: 306 10*3/uL (ref 150.0–400.0)
RBC: 4.73 Mil/uL (ref 3.87–5.11)
WBC: 9.3 10*3/uL (ref 4.5–10.5)

## 2011-10-22 LAB — TSH: TSH: 3.26 u[IU]/mL (ref 0.35–5.50)

## 2011-10-22 LAB — URINALYSIS, ROUTINE W REFLEX MICROSCOPIC
Bilirubin Urine: NEGATIVE
Hgb urine dipstick: NEGATIVE
Ketones, ur: NEGATIVE
Nitrite: NEGATIVE
Total Protein, Urine: NEGATIVE

## 2011-10-22 LAB — BASIC METABOLIC PANEL
BUN: 13 mg/dL (ref 6–23)
Chloride: 104 mEq/L (ref 96–112)
GFR: 89.45 mL/min (ref >60.00)
Potassium: 4.4 mEq/L (ref 3.5–5.1)
Sodium: 139 mEq/L (ref 135–145)

## 2011-10-22 LAB — LIPID PANEL
Total CHOL/HDL Ratio: 4
VLDL: 33 mg/dL (ref 0.0–40.0)

## 2011-10-22 LAB — MICROALBUMIN / CREATININE URINE RATIO
Creatinine,U: 157.9 mg/dL
Microalb, Ur: 1.1 mg/dL (ref 0.0–1.9)

## 2011-10-22 NOTE — Patient Instructions (Addendum)
You had the flu shot today Continue all other medications as before, including the lipitor every other day Please have the pharmacy call with any refills you may need. Please go to LAB in the Basement for the blood and/or urine tests to be done today You will be contacted by phone if any changes need to be made immediately.  Otherwise, you will receive a letter about your results with an explanation. Please remember to sign up for My Chart at your earliest convenience, as this will be important to you in the future with finding out test results. Please return in 6 mo with Lab testing done 3-5 days before

## 2011-10-22 NOTE — Progress Notes (Signed)
Subjective:    Patient ID: Laura [name], female    DOB: Apr 24, 1945, 66 y.o.   MRN: [id number]  HPI   Here for wellness and f/u;  Overall doing ok;  Pt denies CP, worsening SOB, DOE, wheezing, orthopnea, PND, worsening LE edema, palpitations, dizziness or syncope.  Pt denies neurological change such as new Headache, facial or extremity weakness.  Pt denies polydipsia, polyuria, or low sugar symptoms. Pt states overall good compliance with treatment and medications, good tolerability, and trying to follow lower cholesterol diet.  Pt denies worsening depressive symptoms, suicidal ideation or panic. No fever, wt loss, night sweats, loss of appetite, or other constitutional symptoms.  Pt states good ability with ADL's, low fall risk, home safety reviewed and adequate, no significant changes in hearing or vision, and occasionally active with exercise.  Only taking the lipitor qod due to leg weakness and discomfort, not so bad with qod dosing.  Has not had to use the ativan since last visit.  No other acute complaints Past Medical History  Diagnosis Date  . Diabetes mellitus   . Hyperlipidemia   . Hypertension   . UTI (lower urinary tract infection)   . Anxiety   . Reflux 06/29/2011  . Vertigo   . Arthritis     back of neck, bones spurs on neck  . Sleep apnea     wears CPAP occasionally  . Cervical disc disease 10/22/2011   Past Surgical History  Procedure Date  . Bladder surgery   . Cholecystectomy   . Partial hysterectomy   . Fibroids removed     breast (both breasts)    reports that she has never smoked. She has never used smokeless tobacco. She reports that she does not drink alcohol or use illicit drugs. family history includes Diabetes in her father, maternal aunt, and sister and Hypertension in her other.  There is no history of Colon cancer, and Esophageal cancer, and Stomach cancer, and Rectal cancer, . Allergies  Allergen Reactions  . Codeine     itch  . Crestor (Rosuvastatin  Calcium)   . Lipitor (Atorvastatin Calcium)   . Prednisone     Nervous    . Sulfa Antibiotics     Tongue swells, hives, itching   Current Outpatient Prescriptions on File Prior to Visit  Medication Sig Dispense Refill  . amLODipine (NORVASC) 10 MG tablet Take 1 tablet (10 mg total) by mouth daily.  90 tablet  3  . cholecalciferol (VITAMIN D) 1000 UNITS tablet Take 1,000 Units by mouth daily.        . Cyanocobalamin (B-12) 100 MCG TABS Take by mouth daily.       Marland Kitchen GARLIC PO Take by mouth daily.        Marland Kitchen glimepiride (AMARYL) 1 MG tablet Take 1/2 tab  By mouth in the am  45 tablet  3  . glucose blood (ONE TOUCH ULTRA TEST) test strip Use as instructed  100 each  12  . Lancets MISC 1 application by Does not apply route daily.  100 each  11  . LORazepam (ATIVAN) 0.5 MG tablet Take 1 tablet (0.5 mg total) by mouth daily as needed for anxiety.  30 tablet  2  . meloxicam (MOBIC) 7.5 MG tablet       . OMEGA 3 1000 MG CAPS Take 1,000 mg by mouth daily.       Marland Kitchen omeprazole (PRILOSEC) 20 MG capsule Take 1 capsule (20 mg total) by mouth daily.  90 capsule  3  . valsartan-hydrochlorothiazide (DIOVAN HCT) 320-12.5 MG per tablet Take 1 tablet by mouth daily.  90 tablet  3   Review of Systems Review of Systems  Constitutional: Negative for diaphoresis, activity change, appetite change and unexpected weight change.  HENT: Negative for hearing loss, ear pain, facial swelling, mouth sores and neck stiffness.   Eyes: Negative for pain, redness and visual disturbance.  Respiratory: Negative for shortness of breath and wheezing.   Cardiovascular: Negative for chest pain and palpitations.  Gastrointestinal: Negative for diarrhea, blood in stool, abdominal distention and rectal pain.  Genitourinary: Negative for hematuria, flank pain and decreased urine volume.  Musculoskeletal: Negative for myalgias and joint swelling.  Skin: Negative for color change and wound.  Neurological: Negative for syncope and  numbness.  Hematological: Negative for adenopathy.  Psychiatric/Behavioral: Negative for hallucinations, self-injury, decreased concentration and agitation.      Objective:   Physical Exam BP 122/70  Pulse 78  Temp 98.1 F (36.7 C) (Oral)  Ht 5\' 4"  (1.626 m)  Wt 197 lb 2 oz (89.415 kg)  BMI 33.84 kg/m2  SpO2 97% Physical Exam  VS noted Constitutional: Pt is oriented to person, place, and time. Appears well-developed and well-nourished.  HENT:  Head: Normocephalic and atraumatic.  Right Ear: External ear normal.  Left Ear: External ear normal.  Nose: Nose normal.  Mouth/Throat: Oropharynx is clear and moist.  Eyes: Conjunctivae and EOM are normal. Pupils are equal, round, and reactive to light.  Neck: Normal range of motion. Neck supple. No JVD present. No tracheal deviation present.  Cardiovascular: Normal rate, regular rhythm, normal heart sounds and intact distal pulses.   Pulmonary/Chest: Effort normal and breath sounds normal.  Abdominal: Soft. Bowel sounds are normal. There is no tenderness.  Musculoskeletal: Normal range of motion. Exhibits no edema.  Lymphadenopathy:  Has no cervical adenopathy.  Neurological: Pt is alert and oriented to person, place, and time. Pt has normal reflexes. No cranial nerve deficit.  Skin: Skin is warm and dry. No rash noted.  Psychiatric:  Has  normal mood and affect. Behavior is normal.     Assessment & Plan:

## 2011-10-23 NOTE — Assessment & Plan Note (Signed)
stable overall by hx and exam, most recent data reviewed with pt, and pt to continue medical treatment as before Lab Results  Component Value Date   HGBA1C 7.3* 10/22/2011    

## 2011-10-23 NOTE — Assessment & Plan Note (Signed)

## 2011-10-30 ENCOUNTER — Other Ambulatory Visit: Payer: Self-pay

## 2011-10-30 NOTE — Telephone Encounter (Signed)
Per epic I have not prescribed this in the past  Would be too soon as she should have ativan refill as well  Perhaps she is overusing from another MD?

## 2011-10-30 NOTE — Telephone Encounter (Signed)
Pharmacy requesting refill on Diazepam 2 mg

## 2011-10-31 ENCOUNTER — Telehealth: Payer: Self-pay | Admitting: Internal Medicine

## 2011-10-31 NOTE — Telephone Encounter (Signed)
Caller: Laura [name]/Patient; Patient Name: Laura [name]; PCP: Oliver Barre (Adults only); Best Callback Phone Number: [phone] Dizziness Onset: 10/13/11 Dizziness comes and goes . Present for two hours today.  All emergent symptoms ruled out per Dizziness guidelline with exception "having sensation of turning or spinning thtat affects balance and not responsive to 4 hours of home care".  DISPOSTION IS FOR PATIENT TO BE SEEN WITHIN 4 HOURS. ATTEMPTED TO SCHEDULE PATIENT FOR AN APPTOINTMENT BUT THERE ARE NO AVAILABLE OPENINGS FOR TODAY.   Patient states she has on hand Meclizine but was told by Dr. Graceann Congress at [facility] that her brain will never recover from this is she keeps taking the  Meclizine. States Valium has been prescribed both by [facility] and Dr. [name] in past for dizziness. Patient reports she still has her bottle of Ativan from Feb. 2013 and has not refilled this med. since this time and has approimately 50 tabs in bt. . Last Ativan taken was approximately two weeks ago.  ADVISED CALLER WILL SEND NOTE TO CLINIC AND SHOULD HEAR BACK REGARDING POSSIBLE APPT. OR ADVICE. Caller verbalized understanding and agreement.

## 2011-10-31 NOTE — Telephone Encounter (Signed)
Ok to use the ativan since she has this on hand and close to the valium  Consider OV if not improved in 2-3 days

## 2011-10-31 NOTE — Telephone Encounter (Signed)
Kendrea calling back and states spoke to nurse earlier(see previous triage) and has not heard from office concerning refill for Valium. Has been taking Meclizine for dizziness but physical therapist has advised that this medicine will not let "the brain return to normal" and will slow recovery. Has had dizziness since 12-2010. Patient calling back to see if MD has made decision about refilling Valium. Advised may be after hours before can address as is seeing patients. Advised someone will call when decision made. Note from MD seen and advised patient to use Ativan as is related to Valium. If no improvement in 2-3 days, needs appointment.

## 2011-11-03 ENCOUNTER — Telehealth: Payer: Self-pay | Admitting: Internal Medicine

## 2011-11-03 ENCOUNTER — Telehealth: Payer: Self-pay

## 2011-11-03 MED ORDER — DIAZEPAM 2 MG PO TABS: 2.0000 mg | ORAL_TABLET | Freq: Two times a day (BID) | ORAL | Status: DC | PRN

## 2011-11-03 NOTE — Telephone Encounter (Signed)
Caller: Sabrie/Patient; Patient Name: Laura [name]; PCP: Oliver Barre (Adults only); Best Callback Phone Number: [phone] Onset-2-3 weeks ago. Pt calling back concerning dizziness. She states the  Meclizine nor the Ativan are helping. Emergent s/s of Dizziness or Vertigo protocol r/o. Pt to see provider within 24hrs.Pt reqesting appointment today. No appointments in [place] office or with NP or PA  at other offices. Pt offered only opening on Dr. [name] schedule for tomorrow which was 4:15 pm. Pt is very frustrated, she would like a message sent to see if Dr. [name] will send in a few pills of Valium to see if that helps since no appointments are avalilable. Please call.

## 2011-11-03 NOTE — Telephone Encounter (Signed)
Ok for valium 2mg  refill

## 2011-11-03 NOTE — Telephone Encounter (Signed)
Can we try? If complicated, we could consider asking pt to change to klonopin

## 2011-11-03 NOTE — Telephone Encounter (Signed)
Insurance requiring PA on Diazepam please advise

## 2011-11-03 NOTE — Telephone Encounter (Signed)
Pharmacy informed of MD instructions 

## 2011-11-03 NOTE — Telephone Encounter (Signed)
Called informed the patient faxed hardcopy to [facility] pharmacy.

## 2011-11-04 NOTE — Telephone Encounter (Signed)
Called to initiate PA.  Medication was approved until 11/03/12 Case #[id number].  Called the pharmacy to informed of approval.

## 2012-02-06 ENCOUNTER — Other Ambulatory Visit: Payer: Self-pay

## 2012-02-06 MED ORDER — LORAZEPAM 0.5 MG PO TABS: 0.5000 mg | ORAL_TABLET | Freq: Every day | ORAL | Status: DC | PRN

## 2012-02-06 NOTE — Telephone Encounter (Signed)
Faxed hardcopy to pharmacy. 

## 2012-02-06 NOTE — Telephone Encounter (Signed)
Done hardcopy to robin  

## 2012-03-01 ENCOUNTER — Ambulatory Visit (INDEPENDENT_AMBULATORY_CARE_PROVIDER_SITE_OTHER): Payer: Medicare Other | Admitting: Internal Medicine

## 2012-03-01 ENCOUNTER — Encounter: Payer: Self-pay | Admitting: Internal Medicine

## 2012-03-01 ENCOUNTER — Telehealth: Payer: Self-pay | Admitting: Internal Medicine

## 2012-03-01 ENCOUNTER — Other Ambulatory Visit (INDEPENDENT_AMBULATORY_CARE_PROVIDER_SITE_OTHER): Payer: Medicare Other

## 2012-03-01 VITALS — BP 130/74 | HR 87 | Temp 98.6°F | Wt 201.1 lb

## 2012-03-01 DIAGNOSIS — R5381 Other malaise: Secondary | ICD-10-CM

## 2012-03-01 DIAGNOSIS — K589 Irritable bowel syndrome without diarrhea: Secondary | ICD-10-CM

## 2012-03-01 DIAGNOSIS — E119 Type 2 diabetes mellitus without complications: Secondary | ICD-10-CM

## 2012-03-01 DIAGNOSIS — R42 Dizziness and giddiness: Secondary | ICD-10-CM

## 2012-03-01 DIAGNOSIS — R5383 Other fatigue: Secondary | ICD-10-CM

## 2012-03-01 LAB — CBC WITH DIFFERENTIAL/PLATELET
Eosinophils Absolute: 0.1 10*3/uL (ref 0.0–0.7)
Eosinophils Relative: 1.1 % (ref 0.0–5.0)
Lymphocytes Relative: 36.5 % (ref 12.0–46.0)
MCV: 86.1 fl (ref 78.0–100.0)
Monocytes Absolute: 0.5 10*3/uL (ref 0.1–1.0)
Neutrophils Relative %: 56.5 % (ref 43.0–77.0)
Platelets: 292 10*3/uL (ref 150.0–400.0)
RBC: 4.62 Mil/uL (ref 3.87–5.11)
WBC: 8.6 10*3/uL (ref 4.5–10.5)

## 2012-03-01 LAB — HEMOGLOBIN A1C: Hgb A1c MFr Bld: 7.4 % — ABNORMAL HIGH (ref 4.6–6.5)

## 2012-03-01 LAB — VITAMIN B12: Vitamin B-12: 825 pg/mL (ref 211–911)

## 2012-03-01 LAB — HEPATIC FUNCTION PANEL
ALT: 23 U/L (ref 0–35)
AST: 21 U/L (ref 0–37)
Albumin: 4.5 g/dL (ref 3.5–5.2)
Total Bilirubin: 0.4 mg/dL (ref 0.3–1.2)
Total Protein: 8.2 g/dL (ref 6.0–8.3)

## 2012-03-01 LAB — TSH: TSH: 1.92 u[IU]/mL (ref 0.35–5.50)

## 2012-03-01 MED ORDER — DIAZEPAM 2 MG PO TABS: 1.0000 mg | ORAL_TABLET | Freq: Two times a day (BID) | ORAL | Status: DC | PRN

## 2012-03-01 MED ORDER — ONETOUCH DELICA LANCETS 33G MISC: 1.0000 | Freq: Two times a day (BID) | Status: DC

## 2012-03-01 NOTE — Telephone Encounter (Signed)
Patient Information:  Caller Name: Pricella  Phone: [phone]  Patient: Laura [name], [place]  Gender: Female  DOB: 1945/03/21  Age: 67 Years  PCP: Oliver Barre (Adults only)  Office Follow Up:  Does the office need to follow up with this patient?: No  Instructions For The Office: N/A  RN Note:  Dizzy and weak "for hours" yesterday, 02/29/12.  Today feels better.  Reports has "metabolic syndrome."  Last tested fasting blood sugar 02/23/12, one week ago.  Reports  One-touch Delica does barely penetrates the finger. Unable to get blood sample for random blood sugar now due to repeated "error" message. Had oatmeal with apple and raisen for breakfast. Denies thirst, no > urination but is "starving" hungry then feels poorly after eating. Constant blurred vision. Taking Amaryl as ordered.  Symptoms  Reason For Call & Symptoms: Vertigo, wakes up with nausea in mornings, and intermittent weakness/shortness of breath with minimal activity. Also notes flatulance and diarrhea for past 3 weeks.  Reviewed Health History In EMR: Yes  Reviewed Medications In EMR: Yes  Reviewed Allergies In EMR: Yes  Reviewed Surgeries / Procedures: Yes  Date of Onset of Symptoms: 02/09/2012  Treatments Tried: Omeprazole  Treatments Tried Worked: Yes  Guideline(s) Used:  Diabetes - High Blood Sugar  Disposition Per Guideline:   See Today in Office  Reason For Disposition Reached:   Symptoms of high blood sugar (e.g., frequent urination, weak, weight loss) and not able to test blood glucose  Advice Given:  General  Definition of hyperglycemia: - Fasting blood glucose more than 140 mg/dL (7.5 mmol/l) or random blood glucose more than 200 mg/dL (11 mmol/l).  Symptoms of mild hyperglycemia: frequent urination, increased thirst, fatigue, blurred vision.  Symptoms of severe hyperglycemia: weakness, progressing to confusion and coma.  Treatment - Liquids  Drink at least one glass (8 oz or 240 ml) of water per hour for  the next 4 hours. (Reason: adequate hydration will reduce hyperglycemia).  Generally, you should try to drink 6-8 glasses of water each day.  Treatment - Diabetes Medications  : Continue taking your diabetes pills.  Measure and Record Your Blood Glucose  Every day you should measure your blood glucose before breakfast and before going to bed.  Record the results and show them to your doctor at your next office visit.  Expected Course  Your blood sugar continues to get above 240 mg/dl (13 mmol/l).  Your blood sugar continues to be higher than your daily glucose goals (set by you and your doctor).  It has been longer than 6 months since you had an Hemoglobin A1C test.  Appointment Scheduled:  03/01/2012 13:15:00 Appointment Scheduled Provider:  Rene Paci (Adults only)

## 2012-03-01 NOTE — Addendum Note (Signed)
Addended by: Deatra James on: 03/01/2012 04:03 PM   Modules accepted: Orders

## 2012-03-01 NOTE — Progress Notes (Signed)
  Subjective:    Patient ID: Laura [name], female    DOB: 01-Dec-1945, 67 y.o.   MRN: [id number]  HPI  complains of dizziness Hx same - last eval by specialists at [facility] 10/2011 - "inner ear or virus" - symptoms associated with feeling weak, off balance Also other concerns as per ROS below  Past Medical History  Diagnosis Date  . Diabetes mellitus   . Hyperlipidemia   . Hypertension   . Anxiety   . Reflux   . Vertigo   . Arthritis     back of neck, bones spurs on neck  . Sleep apnea     wears CPAP occasionally  . Cervical disc disease     Review of Systems  HENT:       Itching in mouth  Respiratory: Positive for cough and shortness of breath.   Gastrointestinal: Positive for diarrhea and abdominal distention (and excessive gas/belching).  Skin:       Brittle fingernails       Objective:   Physical Exam BP 130/74  Pulse 87  Temp(Src) 98.6 F (37 C) (Oral)  Wt 201 lb 1.9 oz (91.227 kg)  BMI 34.51 kg/m2  SpO2 97% Wt Readings from Last 3 Encounters:  03/01/12 201 lb 1.9 oz (91.227 kg)  10/22/11 197 lb 2 oz (89.415 kg)  09/11/11 195 lb 1.9 oz (88.506 kg)   Constitutional: She appears well-developed and well-nourished. No distress.  HENT: Head: Normocephalic and atraumatic. Ears: B TMs ok, no erythema or effusion; Nose: Nose normal. Mouth/Throat: Oropharynx is clear and moist. No oropharyngeal or mucosal exudate, ulcers or lesions.  Eyes: Conjunctivae and EOM are normal. Pupils are equal, round, and reactive to light. No scleral icterus.  Neck: Normal range of motion. Neck supple. No JVD present. No thyromegaly present.  Cardiovascular: Normal rate, regular rhythm and normal heart sounds.  No murmur heard. No BLE edema. Pulmonary/Chest: Effort normal and breath sounds normal. No respiratory distress. She has no wheezes.  Abdominal: Soft. Bowel sounds are normal. She exhibits no distension. There is no tenderness. no masses Neurological: She is alert and oriented to  person, place, and time. No cranial nerve deficit. Coordination, speech, gait normal. negative Romberg and normal F-nose Skin: Skin is warm and dry. No rash noted. No erythema.   Lab Results  Component Value Date   WBC 9.3 10/22/2011   HGB 13.9 10/22/2011   HCT 41.6 10/22/2011   PLT 306.0 10/22/2011   GLUCOSE 160* 10/22/2011   CHOL 211* 10/22/2011   TRIG 165.0* 10/22/2011   HDL 52.50 10/22/2011   LDLDIRECT 135.1 10/22/2011   LDLCALC 160* 01/05/2011   ALT 26 10/22/2011   AST 24 10/22/2011   NA 139 10/22/2011   K 4.4 10/22/2011   CL 104 10/22/2011   CREATININE 0.8 10/22/2011   BUN 13 10/22/2011   CO2 26 10/22/2011   TSH 3.26 10/22/2011   HGBA1C 7.3* 10/22/2011   MICROALBUR 1.1 10/22/2011   No results found for this basename: VITAMINB12       Assessment & Plan:   Severe and recurrent vertigo - neuro and ENT exam benign Prior eval at [facility] 10/2011 and ENT eval local last week Jenne Pane) Prior vestibular rehab helpful at [facility] - reorder same now Encouraged compliance with valium, but lower dose ok Check labs including B12  IBS-D - recommended follow up with GI as needed -  Fatigue - nonspecific symptoms/exam - check screening labs

## 2012-03-01 NOTE — Assessment & Plan Note (Signed)
Reviewed problems with glucometer - ok for new meter if needed On prn amaryl - Recheck a1c now Lab Results  Component Value Date   HGBA1C 7.3* 10/22/2011

## 2012-03-01 NOTE — Patient Instructions (Signed)
It was good to see you today. We have reviewed your prior records including labs and tests today Test(s) ordered today. Your results will be released to MyChart (or called to you) after review, usually within 72hours after test completion. If any changes need to be made, you will be notified at that same time. Check on your meter today as discussed - let us know if continued problems Reduce dose Valium to 1/2 tab for dizziness control we'll make referral to local vestibular rehab . Our office will contact you regarding appointment(s) once made. follow up with Dr Christella Hartigan and Dr [name] as discussed

## 2012-03-02 ENCOUNTER — Encounter (HOSPITAL_COMMUNITY): Payer: Self-pay | Admitting: Emergency Medicine

## 2012-03-02 ENCOUNTER — Emergency Department (HOSPITAL_COMMUNITY): Payer: Medicare Other

## 2012-03-02 ENCOUNTER — Emergency Department (HOSPITAL_COMMUNITY)
Admission: EM | Admit: 2012-03-02 | Discharge: 2012-03-02 | Disposition: A | Discharge status: Home / Self Care | Payer: Medicare Other | Attending: Emergency Medicine | Admitting: Emergency Medicine

## 2012-03-02 ENCOUNTER — Telehealth: Payer: Self-pay | Admitting: *Deleted

## 2012-03-02 DIAGNOSIS — Z8739 Personal history of other diseases of the musculoskeletal system and connective tissue: Secondary | ICD-10-CM | POA: Insufficient documentation

## 2012-03-02 DIAGNOSIS — F410 Panic disorder [episodic paroxysmal anxiety] without agoraphobia: Secondary | ICD-10-CM

## 2012-03-02 DIAGNOSIS — G4730 Sleep apnea, unspecified: Secondary | ICD-10-CM | POA: Insufficient documentation

## 2012-03-02 DIAGNOSIS — Z862 Personal history of diseases of the blood and blood-forming organs and certain disorders involving the immune mechanism: Secondary | ICD-10-CM | POA: Insufficient documentation

## 2012-03-02 DIAGNOSIS — Z79899 Other long term (current) drug therapy: Secondary | ICD-10-CM | POA: Insufficient documentation

## 2012-03-02 DIAGNOSIS — R110 Nausea: Secondary | ICD-10-CM | POA: Insufficient documentation

## 2012-03-02 DIAGNOSIS — K219 Gastro-esophageal reflux disease without esophagitis: Secondary | ICD-10-CM | POA: Insufficient documentation

## 2012-03-02 DIAGNOSIS — I10 Essential (primary) hypertension: Secondary | ICD-10-CM | POA: Insufficient documentation

## 2012-03-02 DIAGNOSIS — M5090 Cervical disc disorder, unspecified, unspecified cervical region: Secondary | ICD-10-CM | POA: Insufficient documentation

## 2012-03-02 DIAGNOSIS — Z8639 Personal history of other endocrine, nutritional and metabolic disease: Secondary | ICD-10-CM | POA: Insufficient documentation

## 2012-03-02 DIAGNOSIS — R42 Dizziness and giddiness: Secondary | ICD-10-CM

## 2012-03-02 DIAGNOSIS — E119 Type 2 diabetes mellitus without complications: Secondary | ICD-10-CM | POA: Insufficient documentation

## 2012-03-02 LAB — CBC WITH DIFFERENTIAL/PLATELET
Basophils Absolute: 0 10*3/uL (ref 0.0–0.1)
Basophils Relative: 0 % (ref 0–1)
Eosinophils Absolute: 0.1 10*3/uL (ref 0.0–0.7)
Eosinophils Relative: 1 % (ref 0–5)
HCT: 38.8 % (ref 36.0–46.0)
Hemoglobin: 13.1 g/dL (ref 12.0–15.0)
MCH: 28.9 pg (ref 26.0–34.0)
MCHC: 33.8 g/dL (ref 30.0–36.0)
MCV: 85.7 fL (ref 78.0–100.0)
Monocytes Absolute: 0.5 10*3/uL (ref 0.1–1.0)
Monocytes Relative: 6 % (ref 3–12)
Neutro Abs: 4.3 10*3/uL (ref 1.7–7.7)
RDW: 13.2 % (ref 11.5–15.5)

## 2012-03-02 LAB — BASIC METABOLIC PANEL
BUN: 14 mg/dL (ref 6–23)
Calcium: 9.8 mg/dL (ref 8.4–10.5)
Chloride: 102 mEq/L (ref 96–112)
Creatinine, Ser: 0.7 mg/dL (ref 0.50–1.10)
GFR calc Af Amer: >90 mL/min (ref >90)

## 2012-03-02 MED ORDER — MECLIZINE HCL 25 MG PO TABS
25.0000 mg | ORAL_TABLET | Freq: Once | ORAL | Status: AC
  Administered 2012-03-02: 25 mg via ORAL
  Filled 2012-03-02: qty 1

## 2012-03-02 MED ORDER — MECLIZINE HCL 25 MG PO TABS: 25.0000 mg | ORAL_TABLET | Freq: Three times a day (TID) | ORAL | Status: DC | PRN

## 2012-03-02 NOTE — ED Notes (Signed)
Per EMS pt has complaint of anxiety attack. She took Lorazepam 1 mg with no resolve of symptoms pt states that her meds usually works in 2 mins or less and today it is not helping.

## 2012-03-02 NOTE — ED Provider Notes (Signed)
History     CSN: [id number]  Arrival date & time 03/02/12  0272   First MD Initiated Contact with Patient 03/02/12 1038      Chief Complaint  Patient presents with  . Panic Attack  . Dizziness    (Consider location/radiation/quality/duration/timing/severity/associated sxs/prior treatment) The history is provided by the patient.   67 year old female returns after having had an anxiety attack at home. She relates that she was walking when she suddenly had intense sensation of things moving around. This is similar to episodes of vertigo she has had in the past. She relates that she has been having almost constant vertigo for the last year and it has been resistant to treatment. There is mild associated nausea but no vomiting. Once she started having the spinning sensation, and she states she started to have an anxiety attack with hyperventilating. She took a dose of lorazepam and states the anxiety attack seems to have improved. She is still mildly vertiginous. She also relates that for the last week, she has had exertional dyspnea. Curiously, this only occurs when she is outside. Inside her house, she can walk without any difficulty breathing. She denies chest pain, heaviness, tightness, pressure. She denies nocturnal symptoms and denies orthopnea.  Past Medical History  Diagnosis Date  . Diabetes mellitus   . Hyperlipidemia   . Hypertension   . Anxiety   . Reflux   . Vertigo   . Arthritis     back of neck, bones spurs on neck  . Sleep apnea     wears CPAP occasionally  . Cervical disc disease     Past Surgical History  Procedure Laterality Date  . Bladder surgery    . Cholecystectomy    . Partial hysterectomy    . Fibroids removed      breast (both breasts)  . Breast surgery      Family History  Problem Relation Age of Onset  . Hypertension Other   . Diabetes Father   . Diabetes Sister   . Diabetes Maternal Aunt   . Colon cancer Neg Hx   . Esophageal cancer Neg Hx    . Stomach cancer Neg Hx   . Rectal cancer Neg Hx     History  Substance Use Topics  . Smoking status: Never Smoker   . Smokeless tobacco: Never Used  . Alcohol Use: No    OB History   Grav Para Term Preterm Abortions TAB SAB Ect Mult Living                  Review of Systems  All other systems reviewed and are negative.    Allergies  Codeine; Crestor; Lipitor; Prednisone; and Sulfa antibiotics  Home Medications   Current Outpatient Rx  Name  Route  Sig  Dispense  Refill  . amLODipine (NORVASC) 10 MG tablet   Oral   Take 1 tablet (10 mg total) by mouth daily.   90 tablet   3   . cholecalciferol (VITAMIN D) 1000 UNITS tablet   Oral   Take 1,000 Units by mouth daily.           . Cyanocobalamin (B-12) 100 MCG TABS   Oral   Take by mouth daily.          . diazepam (VALIUM) 2 MG tablet   Oral   Take 0.5-1 tablets (1-2 mg total) by mouth every 12 (twelve) hours as needed for anxiety (or vertigo).   60 tablet   2   .  GARLIC PO   Oral   Take by mouth daily.           Marland Kitchen glimepiride (AMARYL) 1 MG tablet      Take 1/2 tab  By mouth in the am   45 tablet   3   . glucose blood (ONE TOUCH ULTRA TEST) test strip      Use as instructed   100 each   12   . Lancets MISC   Does not apply   1 application by Does not apply route daily.   100 each   11   . Loperamide HCl (IMODIUM A-D) 1 MG/7.5ML LIQD   Oral   Take by mouth as needed.         Marland Kitchen LORazepam (ATIVAN) 0.5 MG tablet   Oral   Take 1 tablet (0.5 mg total) by mouth daily as needed for anxiety.   30 tablet   2   . OMEGA 3 1000 MG CAPS   Oral   Take 1,000 mg by mouth as needed.          Marland Kitchen omeprazole (PRILOSEC) 20 MG capsule   Oral   Take 1 capsule (20 mg total) by mouth daily.   90 capsule   3   . ONETOUCH DELICA LANCETS 33G MISC   Does not apply   1 each by Does not apply route 2 (two) times daily. Use to check blood sugar twice a day. Dx 250.00 by Does not apply route. Use to  check blood sugar twice a day. Dx 250.00   100 each   3   . Psyllium (METAMUCIL) 28.3 % POWD   Oral   Take by mouth daily.         . simethicone (GAS-X) 80 MG chewable tablet   Oral   Chew 80 mg by mouth 3 (three) times daily after meals.         . valsartan-hydrochlorothiazide (DIOVAN HCT) 320-12.5 MG per tablet   Oral   Take 1 tablet by mouth daily.   90 tablet   3     BP 142/76  Pulse 82  Temp(Src) 98 F (36.7 C) (Oral)  Resp 20  SpO2 99%  Physical Exam  Nursing note and vitals reviewed.  67 year old female, resting comfortably and in no acute distress. Vital signs are significant for mild hypertension with blood pressure 142/76. Oxygen saturation is 99%, which is normal. Head is normocephalic and atraumatic. PERRLA, EOMI. Oropharynx is clear. Neck is nontender and supple without adenopathy or JVD. Back is nontender and there is no CVA tenderness. Lungs are clear without rales, wheezes, or rhonchi. Chest is nontender. Heart has regular rate and rhythm without murmur. Abdomen is soft, flat, nontender without masses or hepatosplenomegaly and peristalsis is normoactive. Extremities have trace edema, full range of motion is present. Skin is warm and dry without rash. Neurologic: Mental status is normal, cranial nerves are intact, there are no motor or sensory deficits. No nystagmus. Dizziness is reproduced by passive head movement.  ED Course  Procedures (including critical care time)  Results for orders placed during the hospital encounter of 03/02/12  CBC WITH DIFFERENTIAL      Result Value Range   WBC 7.5  4.0 - 10.5 K/uL   RBC 4.53  3.87 - 5.11 MIL/uL   Hemoglobin 13.1  12.0 - 15.0 g/dL   HCT 16.1  09.6 - 04.5 %   MCV 85.7  78.0 - 100.0 fL   MCH  28.9  26.0 - 34.0 pg   MCHC 33.8  30.0 - 36.0 g/dL   RDW 16.1  09.6 - 04.5 %   Platelets 254  150 - 400 K/uL   Neutrophils Relative 57  43 - 77 %   Neutro Abs 4.3  1.7 - 7.7 K/uL   Lymphocytes Relative 35  12  - 46 %   Lymphs Abs 2.7  0.7 - 4.0 K/uL   Monocytes Relative 6  3 - 12 %   Monocytes Absolute 0.5  0.1 - 1.0 K/uL   Eosinophils Relative 1  0 - 5 %   Eosinophils Absolute 0.1  0.0 - 0.7 K/uL   Basophils Relative 0  0 - 1 %   Basophils Absolute 0.0  0.0 - 0.1 K/uL  BASIC METABOLIC PANEL      Result Value Range   Sodium 139  135 - 145 mEq/L   Potassium 4.6  3.5 - 5.1 mEq/L   Chloride 102  96 - 112 mEq/L   CO2 27  19 - 32 mEq/L   Glucose, Bld 163 (*) 70 - 99 mg/dL   BUN 14  6 - 23 mg/dL   Creatinine, Ser 4.09  0.50 - 1.10 mg/dL   Calcium 9.8  8.4 - 81.1 mg/dL   GFR calc non Af Amer 88 (*) >90 mL/min   GFR calc Af Amer >90  >90 mL/min  PRO B NATRIURETIC PEPTIDE      Result Value Range   Pro B Natriuretic peptide (BNP) 54.5  0 - 125 pg/mL   Dg Chest Portable 1 View  03/02/2012  *RADIOLOGY REPORT*  Clinical Data: Panic attacks and dizziness.  PORTABLE CHEST - 1 VIEW  Comparison: 10/28/2007.  Findings: The heart is borderline enlarged but stable.  There is mild tortuosity of the thoracic aorta.  The lungs are clear except for minimal streaky basilar atelectasis.  No infiltrates, edema or effusion.  The bony thorax is intact.  IMPRESSION: Mild stable cardiac enlargement. Streaky basilar atelectasis but no infiltrates, edema or effusions.   Original Report Authenticated By: Rudie Meyer, M.D.       Date: 03/02/2012  Rate: 79  Rhythm: normal sinus rhythm and premature atrial contractions (PAC)  QRS Axis: normal  Intervals: normal  ST/T Wave abnormalities: normal  Conduction Disutrbances:none  Narrative Interpretation: Sinus rhythm with PACs. When compared with ECG of 01/04/2011, no significant changes are seen.  Old EKG Reviewed: unchanged    1. Vertigo   2. Panic attack       MDM  Exacerbation of vertigo. Probable anxiety attack. She'll be given a dose of meclizine and laboratory workup initiated to rule out other causes. Since her anxiety attack has resolved, she does not need  additional benzodiazepines here. Vertigo has been extensively evaluated including MRI of the brain in December 2012 and no imaging is needed today.  Laboratory workup is unremarkable. She feels much better after dose of oral meclizine. She was told by her physician at [facility] over a year ago that meclizine would keep her brain from healing. I've advised her that since her brain has had over a year to heal, and that it was probably okay to use meclizine on an occasional basis and she's given a prescription for meclizine to take at home.      Dione Booze, MD 03/02/12 1332

## 2012-03-02 NOTE — Telephone Encounter (Signed)
noted 

## 2012-03-02 NOTE — Telephone Encounter (Signed)
Left msg on vm stating wanting to let md know she had to call EMS this am had a anxiety attack & very bad dizzy spell. She is currently at [facility]...Raechel Chute

## 2012-03-11 ENCOUNTER — Encounter: Payer: Self-pay | Admitting: Internal Medicine

## 2012-03-11 ENCOUNTER — Ambulatory Visit (INDEPENDENT_AMBULATORY_CARE_PROVIDER_SITE_OTHER): Payer: Medicare Other | Admitting: Internal Medicine

## 2012-03-11 VITALS — BP 122/70 | HR 80 | Temp 98.2°F | Ht 64.0 in | Wt 203.1 lb

## 2012-03-11 DIAGNOSIS — E119 Type 2 diabetes mellitus without complications: Secondary | ICD-10-CM

## 2012-03-11 DIAGNOSIS — F3289 Other specified depressive episodes: Secondary | ICD-10-CM

## 2012-03-11 DIAGNOSIS — I10 Essential (primary) hypertension: Secondary | ICD-10-CM

## 2012-03-11 DIAGNOSIS — F411 Generalized anxiety disorder: Secondary | ICD-10-CM

## 2012-03-11 DIAGNOSIS — F329 Major depressive disorder, single episode, unspecified: Secondary | ICD-10-CM

## 2012-03-11 NOTE — Assessment & Plan Note (Signed)
Ongoing, worse recent, cont benzo prn, declinse SSRI, but for counseling referral

## 2012-03-11 NOTE — Assessment & Plan Note (Signed)
stable overall by history and exam, recent data reviewed with pt, and pt to continue medical treatment as before,  to f/u any worsening symptoms or concerns BP Readings from Last 3 Encounters:  03/11/12 122/70  03/02/12 142/79  03/01/12 130/74

## 2012-03-11 NOTE — Patient Instructions (Addendum)
OK to stop the valium You can also take Delsym OTC for cough, and/or Mucinex (or it's generic off brand) for congestion, and tylenol as needed for pain. Please continue all other medications as before, including the lorazepam Please call if you change your mind about the zoloft or lexapro You will be contacted regarding the referral for: counseling Thank you for enrolling in MyChart. Please follow the instructions below to securely access your online medical record. MyChart allows you to send messages to your doctor, view your test results, renew your prescriptions, schedule appointments, and more. To Log into My Chart online, please go by [employer] or [employer] to [email or web address], or download the MyChart App from the [employer] .  Your Username is: [email or web address] (password [email or web address]) Please send a practice Message on Mychart later today. Please keep your appointments with your specialists as you have planned - opthomology (consider [facility])

## 2012-03-11 NOTE — Assessment & Plan Note (Signed)
Declines SSRI, though I think she could defintely benefit from zoloft or lexapro

## 2012-03-11 NOTE — Assessment & Plan Note (Signed)
stable overall by history and exam, recent data reviewed with pt, and pt to continue medical treatment as before,  to f/u any worsening symptoms or concerns Lab Results  Component Value Date   HGBA1C 7.4* 03/01/2012

## 2012-03-11 NOTE — Progress Notes (Signed)
Subjective:    Patient ID: Laura [name], female    DOB: 15-May-1945, 67 y.o.   MRN: [id number]  HPI Here to f/u; overall doing ok,  Pt denies chest pain, increased sob or doe, wheezing, orthopnea, PND, increased LE swelling, palpitations,  or syncope though cont's to have ongoing dizziness of unclear etiology after eval at [facility].   Pt denies polydipsia, polyuria,   Pt denies new neurological symptoms such as new headache, or facial or extremity weakness or numbness.   Pt states overall good compliance with meds, has been trying to follow lower cholesterol, diabetic diet, with wt overall stable,  but little exercise however. Sister and daughter with bipolar. Has had mild worsening depressive symptoms, no suicidal ideation, or panic; has ongoing anxiety with increase recently but is adamantly opposed to SSRI use given her sister's experience. Past Medical History  Diagnosis Date  . Diabetes mellitus   . Hyperlipidemia   . Hypertension   . Anxiety   . Reflux   . Vertigo   . Arthritis     back of neck, bones spurs on neck  . Sleep apnea     wears CPAP occasionally  . Cervical disc disease    Past Surgical History  Procedure Laterality Date  . Bladder surgery    . Cholecystectomy    . Partial hysterectomy    . Fibroids removed      breast (both breasts)  . Breast surgery      reports that she has never smoked. She has never used smokeless tobacco. She reports that she does not drink alcohol or use illicit drugs. family history includes Diabetes in her father, maternal aunt, and sister and Hypertension in her other.  There is no history of Colon cancer, and Esophageal cancer, and Stomach cancer, and Rectal cancer, . Allergies  Allergen Reactions  . Codeine     itch  . Crestor (Rosuvastatin Calcium) Other (See Comments)    Did something to memory   . Lipitor (Atorvastatin Calcium) Other (See Comments)    Makes weak   . Prednisone     Nervous    . Sulfa Antibiotics    Tongue swells, hives, itching   Current Outpatient Prescriptions on File Prior to Visit  Medication Sig Dispense Refill  . amLODipine (NORVASC) 10 MG tablet Take 10 mg by mouth daily.      Marland Kitchen BIOTIN PO Take 2,000 mg by mouth daily.      . cholecalciferol (VITAMIN D) 1000 UNITS tablet Take 1,000 Units by mouth daily.        Marland Kitchen glimepiride (AMARYL) 1 MG tablet Take 0.5 mg by mouth daily before breakfast. Take 1/2 tab  By mouth in the am      . glucose blood (ONE TOUCH ULTRA TEST) test strip Use as instructed  100 each  12  . ibuprofen (ADVIL,MOTRIN) 200 MG tablet Take 200 mg by mouth every 6 (six) hours as needed for pain.      . Lancets MISC 1 application by Does not apply route daily.  100 each  11  . loperamide (IMODIUM A-D) 2 MG tablet Take 1-2 mg by mouth 4 (four) times daily as needed for diarrhea or loose stools.      Marland Kitchen LORazepam (ATIVAN) 0.5 MG tablet Take 0.5 mg by mouth daily as needed for anxiety.      . meclizine (ANTIVERT) 25 MG tablet Take 1 tablet (25 mg total) by mouth 3 (three) times daily as needed.  30  tablet  0  . OMEGA 3 1000 MG CAPS Take 1,000 mg by mouth daily.       Marland Kitchen omeprazole (PRILOSEC) 20 MG capsule Take 20 mg by mouth daily as needed (heart burn).      Letta Pate DELICA LANCETS 33G MISC 1 each by Does not apply route 2 (two) times daily. Use to check blood sugar twice a day. Dx 250.00 by Does not apply route. Use to check blood sugar twice a day. Dx 250.00  100 each  3  . Psyllium (METAMUCIL) 28.3 % POWD Take 5 mLs by mouth daily.       . simethicone (GAS-X) 80 MG chewable tablet Chew 80 mg by mouth 3 (three) times daily after meals.      . trolamine salicylate (ASPERCREME) 10 % cream Apply 1 application topically 2 (two) times daily as needed (pain).      . valsartan-hydrochlorothiazide (DIOVAN-HCT) 320-12.5 MG per tablet Take 1 tablet by mouth daily.      . vitamin B-12 (CYANOCOBALAMIN) 1000 MCG tablet Take 1,000 mcg by mouth daily.       No current  facility-administered medications on file prior to visit.   Review of Systems  Constitutional: Negative for unexpected weight change, or unusual diaphoresis  HENT: Negative for tinnitus.   Eyes: Negative for photophobia and visual disturbance.  Respiratory: Negative for choking and stridor.   Gastrointestinal: Negative for vomiting and blood in stool.  Genitourinary: Negative for hematuria and decreased urine volume.  Musculoskeletal: Negative for acute joint swelling Skin: Negative for color change and wound.  Neurological: Negative for tremors and numbness other than noted  Psychiatric/Behavioral: Negative for decreased concentration or  hyperactivity.       Objective:   Physical Exam BP 122/70  Pulse 80  Temp(Src) 98.2 F (36.8 C) (Oral)  Ht 5\' 4"  (1.626 m)  Wt 203 lb 2 oz (92.137 kg)  BMI 34.85 kg/m2  SpO2 97% VS noted,  Constitutional: Pt appears well-developed and well-nourished.  HENT: Head: NCAT.  Right Ear: External ear normal.  Left Ear: External ear normal.  Eyes: Conjunctivae and EOM are normal. Pupils are equal, round, and reactive to light.  Neck: Normal range of motion. Neck supple.  Cardiovascular: Normal rate and regular rhythm.   Pulmonary/Chest: Effort normal and breath sounds normal.  Abd:  Soft, NT, non-distended, + BS Neurological: Pt is alert. Not confused  Skin: Skin is warm. No erythema.  Psychiatric: Pt behavior is normal. Thought content normal. 2+ nervous, +depressed affect    Assessment & Plan:

## 2012-03-25 ENCOUNTER — Encounter: Payer: Self-pay | Admitting: Internal Medicine

## 2012-03-30 ENCOUNTER — Ambulatory Visit: Payer: Medicare Other | Admitting: Psychiatry

## 2012-03-31 ENCOUNTER — Ambulatory Visit (INDEPENDENT_AMBULATORY_CARE_PROVIDER_SITE_OTHER): Payer: Federal, State, Local not specified - Other | Admitting: Psychiatry

## 2012-03-31 DIAGNOSIS — F0630 Mood disorder due to known physiological condition, unspecified: Secondary | ICD-10-CM

## 2012-04-01 ENCOUNTER — Ambulatory Visit: Payer: Medicare Other | Attending: Internal Medicine | Admitting: Physical Therapy

## 2012-04-01 DIAGNOSIS — R269 Unspecified abnormalities of gait and mobility: Secondary | ICD-10-CM | POA: Insufficient documentation

## 2012-04-01 DIAGNOSIS — R42 Dizziness and giddiness: Secondary | ICD-10-CM | POA: Insufficient documentation

## 2012-04-01 DIAGNOSIS — IMO0001 Reserved for inherently not codable concepts without codable children: Secondary | ICD-10-CM | POA: Insufficient documentation

## 2012-04-06 ENCOUNTER — Ambulatory Visit (INDEPENDENT_AMBULATORY_CARE_PROVIDER_SITE_OTHER): Payer: 59 | Admitting: Psychiatry

## 2012-04-06 ENCOUNTER — Encounter: Payer: Self-pay | Admitting: Internal Medicine

## 2012-04-06 DIAGNOSIS — F0630 Mood disorder due to known physiological condition, unspecified: Secondary | ICD-10-CM

## 2012-04-07 ENCOUNTER — Ambulatory Visit: Payer: Medicare Other | Admitting: Physical Therapy

## 2012-04-09 ENCOUNTER — Ambulatory Visit: Payer: Medicare Other | Admitting: Physical Therapy

## 2012-04-12 ENCOUNTER — Telehealth: Payer: Self-pay

## 2012-04-12 ENCOUNTER — Ambulatory Visit: Payer: Medicare Other | Admitting: Physical Therapy

## 2012-04-12 NOTE — Telephone Encounter (Signed)
Left message on machine for pt to return my call per her request left on triage VM

## 2012-04-14 ENCOUNTER — Ambulatory Visit (INDEPENDENT_AMBULATORY_CARE_PROVIDER_SITE_OTHER): Payer: Federal, State, Local not specified - Other | Admitting: Psychiatry

## 2012-04-14 ENCOUNTER — Telehealth: Payer: Self-pay | Admitting: Internal Medicine

## 2012-04-14 DIAGNOSIS — F4323 Adjustment disorder with mixed anxiety and depressed mood: Secondary | ICD-10-CM

## 2012-04-14 NOTE — Telephone Encounter (Signed)
Patient Information:  Caller Name: [place]  Phone: [phone]  Patient: Laura [name], Laura [name]  Gender: Female  DOB: 1945-03-01  Age: 67 Years  PCP: Oliver Barre (Adults only)  Office Follow Up:  Does the office need to follow up with this patient?: Yes  Instructions For The Office: Follow up with provide and call patient back regarding her situation.  RN scheduled appointment for evaluation with Dr. [name] 04/16/02 at 11:15.  RN Note:  Initial conversation was about asking her provider if medications for BP and Diabetes could be making her long standing history of vertigo worse and causing her blurred vision.  She has been evaluated by her eye care provider with nothing found.  Her health insurance nurse suggested she check with her PCP to see if her diabetes medication (glimepiride) of her hypertension medication (Valsartan-HCTZ) whose side affects could be possible causes.  She is returning the office call from Monday today.  In further discussion patient notes that when the episodes happen, she has had a panic attack which leads to an ED visit 03/02/12.  States has had 2 episodes recently that made her weak.  States that when her blood sugars are checked at various places it is running in the acceptable ranges as is her BP.  However, she is not currently checking the readings. Patient was encourage to do so and to bring values and especially her BP device in to compare it with the professional readings.  RN noted recent values of both blood sugar and Blood pressure have been essentially within normal ranges.  Due to her anxiety, RN asked patient to allow her be assessed. Triaged with care advice given. Disposition to have this discussed with PCP and call back by nurse today.  Office has closed by completion of triage.  Essential patient is stable and she is knowledgeable when to call 911 and has done so before.  Appointment made for Dr. [name] in the morning at 11:15.  Patient encouraged to not drive and  find someone to bring her to the office.  Symptoms  Reason For Call & Symptoms: Dizziness  and blurry vision  Reviewed Health History In EMR: Yes  Reviewed Medications In EMR: Yes  Reviewed Allergies In EMR: Yes  Reviewed Surgeries / Procedures: Yes  Date of Onset of Symptoms: 03/02/2012  Guideline(s) Used:  Dizziness  Disposition Per Guideline:   Discuss with PCP and Callback by Nurse Today  Reason For Disposition Reached:   Taking a medicine that could cause dizziness (e.g., blood pressure medications, diuretics)  Advice Given:  Some Causes of Temporary Dizziness:  Poor Fluid Intake - Not drinking enough fluids and being a little dehydrated is a common cause of temporary dizziness. This is always worse during hot weather.  Standing Up Suddenly - Standing up suddenly (especially getting out of bed) or prolonged standing in one place are common causes of temporary dizziness. Not drinking enough fluids always makes it worse. Certain medications can cause or increase this type of dizziness (e.g., blood pressure medications).  Rest for 1-2 Hours:  Lie down with feet elevated for 1 hour. This will improve blood flow and increase blood flow to the brain.  Cool Off:  If the weather is hot, apply a cold compress to the forehead or take a cool shower or bath.  Drink Fluids:  Drink several glasses of fruit juice, other clear fluids, or water. This will improve hydration and blood glucose. If you have a fever or have had heat exposure,  make sure the fluids are cold.  Call Back If:  Passes out (faints)  You become worse.  Patient Will Follow Care Advice:  YES

## 2012-04-15 ENCOUNTER — Ambulatory Visit (INDEPENDENT_AMBULATORY_CARE_PROVIDER_SITE_OTHER): Payer: Medicare Other | Admitting: Internal Medicine

## 2012-04-15 ENCOUNTER — Encounter: Payer: Self-pay | Admitting: Internal Medicine

## 2012-04-15 ENCOUNTER — Encounter: Payer: Self-pay | Admitting: Physical Therapy

## 2012-04-15 VITALS — BP 132/80 | HR 85 | Temp 97.9°F | Ht 64.0 in | Wt 204.1 lb

## 2012-04-15 DIAGNOSIS — I10 Essential (primary) hypertension: Secondary | ICD-10-CM

## 2012-04-15 DIAGNOSIS — E119 Type 2 diabetes mellitus without complications: Secondary | ICD-10-CM

## 2012-04-15 DIAGNOSIS — N318 Other neuromuscular dysfunction of bladder: Secondary | ICD-10-CM

## 2012-04-15 DIAGNOSIS — M25519 Pain in unspecified shoulder: Secondary | ICD-10-CM

## 2012-04-15 DIAGNOSIS — M25512 Pain in left shoulder: Secondary | ICD-10-CM

## 2012-04-15 MED ORDER — VALSARTAN-HYDROCHLOROTHIAZIDE 160-12.5 MG PO TABS: 1.0000 | ORAL_TABLET | Freq: Every day | ORAL | Status: DC

## 2012-04-15 MED ORDER — TOLTERODINE TARTRATE ER 4 MG PO CP24: 4.0000 mg | ORAL_CAPSULE | Freq: Every day | ORAL | Status: DC

## 2012-04-15 NOTE — Patient Instructions (Addendum)
OK to stop the diovan HCT 320/12.5 mg Please take all new medication as prescribed - the lower dose diovan HCT You can also take the tylenol arthritis OTC for the left shoudler as needed  Please re-start the Detrol LA 4 mg per day Please continue all other medications as before, including the glimeparide Thank you for enrolling in MyChart. Please follow the instructions below to securely access your online medical record. MyChart allows you to send messages to your doctor, view your test results, renew your prescriptions, schedule appointments, and more. Please return in 3 months, or sooner if needed

## 2012-04-15 NOTE — Assessment & Plan Note (Signed)
States recurring severe pain, none now and benign exam, ok to follow, tylenol prn,  to f/u any worsening symptoms or concerns

## 2012-04-15 NOTE — Assessment & Plan Note (Signed)
To re-start the detrol LA 4 mg,  to f/u any worsening symptoms or concerns

## 2012-04-15 NOTE — Assessment & Plan Note (Signed)
stable overall by history and exam, recent data reviewed with pt, and pt to continue medical treatment as before,  to f/u any worsening symptoms or concerns Lab Results  Component Value Date   HGBA1C 7.4* 03/01/2012

## 2012-04-15 NOTE — Progress Notes (Signed)
Subjective:    Patient ID: Laura [name], female    DOB: [birth year], 67 y.o.   MRN: [id number]  HPI  Here to f/u; overall doing ok,  Pt denies chest pain, increased sob or doe, wheezing, orthopnea, PND, increased LE swelling, palpitations,  or syncope, except for recent significant dizziness/lightheaded last Sunday before church, felt better the next day, but the third day felt dizzy and weak again, had some blurry vision that day as well, now better, without HA. Did have recent eye exam with minor refraction error at worst.    Pt denies polydipsia, polyuria, or low sugar symptoms such as weakness or confusion improved with po intake, except has her OAB symptoms and asks to re-start the detrol LA 4 mg as this helped before. Pt denies new neurological symptoms such as new headache, or facial or extremity weakness or numbness.   Pt states overall good compliance with meds, has been trying to follow lower cholesterol, diabetic diet, with wt overall stable,  but little exercise however.  Denies worsening depressive symptoms, suicidal ideation, or panic; has ongoing anxiety, not increased recently. Past Medical History  Diagnosis Date  . Diabetes mellitus   . Hyperlipidemia   . Hypertension   . Anxiety   . Reflux   . Vertigo   . Arthritis     back of neck, bones spurs on neck  . Sleep apnea     wears CPAP occasionally  . Cervical disc disease    Past Surgical History  Procedure Laterality Date  . Bladder surgery    . Cholecystectomy    . Partial hysterectomy    . Fibroids removed      breast (both breasts)  . Breast surgery      reports that she has never smoked. She has never used smokeless tobacco. She reports that she does not drink alcohol or use illicit drugs. family history includes Diabetes in her father, maternal aunt, and sister and Hypertension in her other.  There is no history of Colon cancer, and Esophageal cancer, and Stomach cancer, and Rectal cancer, . Allergies   Allergen Reactions  . Codeine     itch  . Crestor (Rosuvastatin Calcium) Other (See Comments)    Did something to memory   . Lipitor (Atorvastatin Calcium) Other (See Comments)    Makes weak   . Prednisone     Nervous    . Sulfa Antibiotics     Tongue swells, hives, itching   Current Outpatient Prescriptions on File Prior to Visit  Medication Sig Dispense Refill  . cholecalciferol (VITAMIN D) 1000 UNITS tablet Take 1,000 Units by mouth daily.        Marland Kitchen glimepiride (AMARYL) 1 MG tablet Take 0.5 mg by mouth daily before breakfast. Take 1/2 tab  By mouth in the am      . glucose blood (ONE TOUCH ULTRA TEST) test strip Use as instructed  100 each  12  . Lancets MISC 1 application by Does not apply route daily.  100 each  11  . loperamide (IMODIUM A-D) 2 MG tablet Take 1-2 mg by mouth 4 (four) times daily as needed for diarrhea or loose stools.      Marland Kitchen LORazepam (ATIVAN) 0.5 MG tablet Take 0.5 mg by mouth daily as needed for anxiety.      Marland Kitchen omeprazole (PRILOSEC) 20 MG capsule Take 20 mg by mouth daily as needed (heart burn).      Letta Pate DELICA LANCETS 33G MISC 1 each  by Does not apply route 2 (two) times daily. Use to check blood sugar twice a day. Dx 250.00 by Does not apply route. Use to check blood sugar twice a day. Dx 250.00  100 each  3  . Psyllium (METAMUCIL) 28.3 % POWD Take 5 mLs by mouth daily.       . simethicone (GAS-X) 80 MG chewable tablet Chew 80 mg by mouth 3 (three) times daily after meals.      . trolamine salicylate (ASPERCREME) 10 % cream Apply 1 application topically 2 (two) times daily as needed (pain).      Marland Kitchen amLODipine (NORVASC) 10 MG tablet Take 10 mg by mouth daily.      . meclizine (ANTIVERT) 25 MG tablet Take 1 tablet (25 mg total) by mouth 3 (three) times daily as needed.  30 tablet  0   No current facility-administered medications on file prior to visit.   Review of Systems  Constitutional: Negative for unexpected weight change, or unusual diaphoresis   HENT: Negative for tinnitus.   Eyes: Negative for photophobia and visual disturbance.  Respiratory: Negative for choking and stridor.   Gastrointestinal: Negative for vomiting and blood in stool.  Genitourinary: Negative for hematuria and decreased urine volume.  Musculoskeletal: Negative for acute joint swelling Skin: Negative for color change and wound.  Neurological: Negative for tremors and numbness other than noted  Psychiatric/Behavioral: Negative for decreased concentration or  hyperactivity.       Objective:   Physical Exam BP 132/80  Pulse 85  Temp(Src) 97.9 F (36.6 C) (Oral)  Ht 5\' 4"  (1.626 m)  Wt 204 lb 2 oz (92.59 kg)  BMI 35.02 kg/m2  SpO2 98% VS noted, not orthostatic as documented Constitutional: Pt appears well-developed and well-nourished.  HENT: Head: NCAT.  Right Ear: External ear normal.  Left Ear: External ear normal.  Eyes: Conjunctivae and EOM are normal. Pupils are equal, round, and reactive to light.  Neck: Normal range of motion. Neck supple.  Cardiovascular: Normal rate and regular rhythm.   Pulmonary/Chest: Effort normal and breath sounds normal.  Left shoulder NT, FROM, no swelling Neurological: Pt is alert. Not confused, motor/dtr intact  Skin: Skin is warm. No erythema. No rash Psychiatric: Pt behavior is normal. Thought content normal. 1+ nervous    Assessment & Plan:

## 2012-04-15 NOTE — Assessment & Plan Note (Signed)
I suspect somewhat overcontrolled with her recurring vague dizziness and relative Low BP today, ok to decrease the diovan HCT to 160/12.5, f/u next visit

## 2012-04-16 ENCOUNTER — Ambulatory Visit: Payer: Medicare Other | Admitting: Physical Therapy

## 2012-04-19 ENCOUNTER — Ambulatory Visit: Payer: Medicare Other | Attending: Internal Medicine | Admitting: Physical Therapy

## 2012-04-19 DIAGNOSIS — R269 Unspecified abnormalities of gait and mobility: Secondary | ICD-10-CM | POA: Insufficient documentation

## 2012-04-19 DIAGNOSIS — IMO0001 Reserved for inherently not codable concepts without codable children: Secondary | ICD-10-CM | POA: Insufficient documentation

## 2012-04-19 DIAGNOSIS — R42 Dizziness and giddiness: Secondary | ICD-10-CM | POA: Insufficient documentation

## 2012-04-23 ENCOUNTER — Ambulatory Visit: Payer: Medicare Other | Admitting: Physical Therapy

## 2012-04-26 ENCOUNTER — Ambulatory Visit: Payer: Medicare Other | Admitting: Physical Therapy

## 2012-04-27 ENCOUNTER — Ambulatory Visit (INDEPENDENT_AMBULATORY_CARE_PROVIDER_SITE_OTHER): Payer: 59 | Admitting: Psychiatry

## 2012-04-27 DIAGNOSIS — F0630 Mood disorder due to known physiological condition, unspecified: Secondary | ICD-10-CM

## 2012-04-29 ENCOUNTER — Ambulatory Visit: Payer: Medicare Other | Admitting: Physical Therapy

## 2012-05-11 ENCOUNTER — Ambulatory Visit (INDEPENDENT_AMBULATORY_CARE_PROVIDER_SITE_OTHER): Payer: Federal, State, Local not specified - Other | Admitting: Psychiatry

## 2012-05-11 DIAGNOSIS — F0630 Mood disorder due to known physiological condition, unspecified: Secondary | ICD-10-CM

## 2012-05-13 ENCOUNTER — Encounter: Payer: Self-pay | Admitting: Internal Medicine

## 2012-05-13 ENCOUNTER — Ambulatory Visit (INDEPENDENT_AMBULATORY_CARE_PROVIDER_SITE_OTHER): Payer: Medicare Other | Admitting: Internal Medicine

## 2012-05-13 VITALS — BP 140/74 | HR 82 | Temp 99.1°F | Ht 64.0 in | Wt 204.0 lb

## 2012-05-13 DIAGNOSIS — M62838 Other muscle spasm: Secondary | ICD-10-CM

## 2012-05-13 DIAGNOSIS — M62831 Muscle spasm of calf: Secondary | ICD-10-CM

## 2012-05-13 MED ORDER — CYCLOBENZAPRINE HCL 10 MG PO TABS: 10.0000 mg | ORAL_TABLET | Freq: Three times a day (TID) | ORAL | Status: DC | PRN

## 2012-05-13 NOTE — Progress Notes (Signed)
Subjective:    Patient ID: Laura [name], female    DOB: July 03, 1945, 67 y.o.   MRN: [id number]  HPI  Pt presents to the clinic today with c/o a charlie horse in her right leg yesterday. She reports that her leg drew up and her muscle got real tense. After about 15 minutes she was able to get her calf muscle to relax. The are has been sore since she got the charlie horse though and it is causing her to limp. She has never had muscle pains like this in the past.   Review of Systems  Past Medical History  Diagnosis Date  . Diabetes mellitus   . Hyperlipidemia   . Hypertension   . Anxiety   . Reflux   . Vertigo   . Arthritis     back of neck, bones spurs on neck  . Sleep apnea     wears CPAP occasionally  . Cervical disc disease     Current Outpatient Prescriptions  Medication Sig Dispense Refill  . amLODipine (NORVASC) 10 MG tablet Take 10 mg by mouth daily.      . cholecalciferol (VITAMIN D) 1000 UNITS tablet Take 1,000 Units by mouth daily.        Marland Kitchen glimepiride (AMARYL) 1 MG tablet Take 0.5 mg by mouth daily before breakfast. Take 1/2 tab  By mouth in the am      . glucose blood (ONE TOUCH ULTRA TEST) test strip Use as instructed  100 each  12  . Lancets MISC 1 application by Does not apply route daily.  100 each  11  . loperamide (IMODIUM A-D) 2 MG tablet Take 1-2 mg by mouth 4 (four) times daily as needed for diarrhea or loose stools.      Marland Kitchen LORazepam (ATIVAN) 0.5 MG tablet Take 0.5 mg by mouth daily as needed for anxiety.      . meclizine (ANTIVERT) 25 MG tablet Take 1 tablet (25 mg total) by mouth 3 (three) times daily as needed.  30 tablet  0  . omeprazole (PRILOSEC) 20 MG capsule Take 20 mg by mouth daily as needed (heart burn).      Letta Pate DELICA LANCETS 33G MISC 1 each by Does not apply route 2 (two) times daily. Use to check blood sugar twice a day. Dx 250.00 by Does not apply route. Use to check blood sugar twice a day. Dx 250.00  100 each  3  . Psyllium  (METAMUCIL) 28.3 % POWD Take 5 mLs by mouth daily.       . simethicone (GAS-X) 80 MG chewable tablet Chew 80 mg by mouth 3 (three) times daily after meals.      . trolamine salicylate (ASPERCREME) 10 % cream Apply 1 application topically 2 (two) times daily as needed (pain).      . valsartan-hydrochlorothiazide (DIOVAN HCT) 160-12.5 MG per tablet Take 1 tablet by mouth daily.  90 tablet  3  . cyclobenzaprine (FLEXERIL) 10 MG tablet Take 1 tablet (10 mg total) by mouth 3 (three) times daily as needed for muscle spasms.  30 tablet  0  . tolterodine (DETROL LA) 4 MG 24 hr capsule Take 1 capsule (4 mg total) by mouth daily.  90 capsule  3   No current facility-administered medications for this visit.    Allergies  Allergen Reactions  . Codeine     itch  . Crestor (Rosuvastatin Calcium) Other (See Comments)    Did something to memory   .  Lipitor (Atorvastatin Calcium) Other (See Comments)    Makes weak   . Prednisone     Nervous    . Sulfa Antibiotics     Tongue swells, hives, itching    Family History  Problem Relation Age of Onset  . Hypertension Other   . Diabetes Father   . Diabetes Sister   . Diabetes Maternal Aunt   . Colon cancer Neg Hx   . Esophageal cancer Neg Hx   . Stomach cancer Neg Hx   . Rectal cancer Neg Hx     History   Social History  . Marital Status: Divorced    Spouse Name: N/A    Number of Children: 4  . Years of Education: 16   Occupational History  . retired Photographer    Social History Main Topics  . Smoking status: Never Smoker   . Smokeless tobacco: Never Used  . Alcohol Use: No  . Drug Use: No  . Sexually Active: Not Currently   Other Topics Concern  . Not on file   Social History Narrative  . No narrative on file     Constitutional: Denies fever, malaise, fatigue, headache or abrupt weight changes.  Musculoskeletal: Pt reports muscle cramp of left calf muscle. Denies decrease in range of motion, difficulty with  gait, or joint pain and swelling.  Neurological: Pt reports dizziness. Denies difficulty with memory, difficulty with speech or problems with balance and coordination.   No other specific complaints in a complete review of systems (except as listed in HPI above).     Objective:   Physical Exam  BP 140/74  Pulse 82  Temp(Src) 99.1 F (37.3 C) (Oral)  Ht 5\' 4"  (1.626 m)  Wt 204 lb (92.534 kg)  BMI 35 kg/m2  SpO2 97% Wt Readings from Last 3 Encounters:  05/13/12 204 lb (92.534 kg)  04/15/12 204 lb 2 oz (92.59 kg)  03/11/12 203 lb 2 oz (92.137 kg)    General: Appears her stated age, well developed, well nourished in NAD. Cardiovascular: Normal rate and rhythm. S1,S2 noted.  No murmur, rubs or gallops noted. No JVD or BLE edema. No carotid bruits noted. Pulmonary/Chest: Normal effort and positive vesicular breath sounds. No respiratory distress. No wheezes, rales or ronchi noted.  Musculoskeletal: Normal range of motion. No signs of joint swelling. No difficulty with gait. Tenderness noted over left calf muscle. No erythema or swelling noted. Neurological: Alert and oriented. Cranial nerves II-XII intact. Coordination normal. +DTRs bilaterally. Negative rhomberg.        Assessment & Plan:   Muscle spasm of left calf muscle, new onset:  Labs reviewed, no signs of electrolyte deficiency eRx for flexeril 10 mg daily Perform stretching exercises as shown on handout  Dizziness, chronic:  Continue vestibular rehab Continue meclizine  RTC as needed or if symptoms persist

## 2012-05-13 NOTE — Patient Instructions (Signed)
    Muscle Cramps Muscle cramps are due to sudden involuntary muscle contraction. This means you have no control over the tightening of a muscle (or muscles). Often there are no obvious causes. Muscle cramps may occur with overexertion. They may also occur with chilling of the muscles. An example of a muscle chilling activity is swimming. It is uncommon for cramps to be due to a serious underlying disorder. In most cases, muscle cramps improve (or leave) within minutes. CAUSES   Some common causes are:  Injury.   Infections, especially viral.   Abnormal levels of the salts and ions in your blood (electrolytes). This could happen if you are taking water pills (diuretics).   Blood vessel disease where not enough blood is getting to the muscles (intermittent claudication).  Some uncommon causes are:  Side effects of some medicine (such as lithium).   Alcohol abuse.   Diseases where there is soreness (inflammation) of the muscular system.  HOME CARE INSTRUCTIONS    It may be helpful to massage, stretch, and relax the affected muscle.   Taking a dose of over-the-counter diphenhydramine is helpful for night leg cramps.  SEEK MEDICAL CARE IF:   Cramps are frequent and not relieved with medicine. MAKE SURE YOU:    Understand these instructions.   Will watch your condition.   Will get help right away if you are not doing well or get worse.  Document Released: 06/21/2001 Document Revised: 03/24/2011 Document Reviewed: 12/22/2007 [employer] Patient Information 2013 [employer].    

## 2012-05-17 ENCOUNTER — Encounter: Payer: Self-pay | Admitting: Internal Medicine

## 2012-05-18 ENCOUNTER — Other Ambulatory Visit: Payer: Self-pay | Admitting: Oncology

## 2012-05-25 ENCOUNTER — Ambulatory Visit (INDEPENDENT_AMBULATORY_CARE_PROVIDER_SITE_OTHER): Payer: Federal, State, Local not specified - Other | Admitting: Psychiatry

## 2012-05-25 DIAGNOSIS — F0630 Mood disorder due to known physiological condition, unspecified: Secondary | ICD-10-CM

## 2012-05-26 ENCOUNTER — Encounter: Payer: Self-pay | Admitting: Internal Medicine

## 2012-06-01 ENCOUNTER — Ambulatory Visit: Payer: 59 | Admitting: Psychiatry

## 2012-06-08 ENCOUNTER — Encounter: Payer: Self-pay | Admitting: Internal Medicine

## 2012-06-08 ENCOUNTER — Ambulatory Visit (INDEPENDENT_AMBULATORY_CARE_PROVIDER_SITE_OTHER): Payer: Medicare Other | Admitting: Internal Medicine

## 2012-06-08 VITALS — BP 128/74 | HR 102 | Temp 99.5°F | Ht 64.0 in | Wt 205.0 lb

## 2012-06-08 DIAGNOSIS — J069 Acute upper respiratory infection, unspecified: Secondary | ICD-10-CM

## 2012-06-08 DIAGNOSIS — R05 Cough: Secondary | ICD-10-CM

## 2012-06-08 MED ORDER — AZITHROMYCIN 250 MG PO TABS: ORAL_TABLET | ORAL | Status: DC

## 2012-06-08 NOTE — Progress Notes (Signed)
HPI  Pt presents to the clinic today with c/o cold symptoms x 1 weeks. The worst part is the sore throat and dry cough. She does not produce any sputum. She has been running fevers. She has tried Robitussin, Mucinex, cough drops and nothing seems to help. The cough is worse at night. She has not had much sleep in 3 nights. She does have a history of allergies but no asthma. She does have sick contacts.  Review of Systems      Past Medical History  Diagnosis Date  . Diabetes mellitus   . Hyperlipidemia   . Hypertension   . Anxiety   . Reflux   . Vertigo   . Arthritis     back of neck, bones spurs on neck  . Sleep apnea     wears CPAP occasionally  . Cervical disc disease     Family History  Problem Relation Age of Onset  . Hypertension Other   . Diabetes Father   . Diabetes Sister   . Diabetes Maternal Aunt   . Colon cancer Neg Hx   . Esophageal cancer Neg Hx   . Stomach cancer Neg Hx   . Rectal cancer Neg Hx     History   Social History  . Marital Status: Divorced    Spouse Name: N/A    Number of Children: 4  . Years of Education: 16   Occupational History  . retired Photographer    Social History Main Topics  . Smoking status: Never Smoker   . Smokeless tobacco: Never Used  . Alcohol Use: No  . Drug Use: No  . Sexually Active: Not Currently   Other Topics Concern  . Not on file   Social History Narrative  . No narrative on file    Allergies  Allergen Reactions  . Codeine     itch  . Crestor (Rosuvastatin Calcium) Other (See Comments)    Did something to memory   . Lipitor (Atorvastatin Calcium) Other (See Comments)    Makes weak   . Prednisone     Nervous    . Sulfa Antibiotics     Tongue swells, hives, itching     Constitutional: Positive headache, fatigue and fever. Denies abrupt weight changes.  HEENT:  Positive sore throat. Denies eye redness, eye pain, pressure behind the eyes, facial pain, nasal congestion, ear pain,  ringing in the ears, wax buildup, runny nose or bloody nose. Respiratory: Positive cough. Denies difficulty breathing or shortness of breath.  Cardiovascular: Denies chest pain, chest tightness, palpitations or swelling in the hands or feet.   No other specific complaints in a complete review of systems (except as listed in HPI above).  Objective:   BP 128/74  Pulse 102  Temp(Src) 99.5 F (37.5 C) (Oral)  Ht 5\' 4"  (1.626 m)  Wt 205 lb (92.987 kg)  BMI 35.17 kg/m2  SpO2 93% Wt Readings from Last 3 Encounters:  06/08/12 205 lb (92.987 kg)  05/13/12 204 lb (92.534 kg)  04/15/12 204 lb 2 oz (92.59 kg)     General: Appears her stated age, well developed, well nourished in NAD. HEENT: Head: normal shape and size; Eyes: sclera white, no icterus, conjunctiva pink, PERRLA and EOMs intact; Ears: Tm's gray and intact, normal light reflex; Nose: mucosa pink and moist, septum midline; Throat/Mouth: + PND. Teeth present, mucosa erythematous and moist, no exudate noted, no lesions or ulcerations noted.  Neck: Mild cervical lymphadenopathy. Neck supple, trachea midline. No  massses, lumps or thyromegaly present.  Cardiovascular: Normal rate and rhythm. S1,S2 noted.  No murmur, rubs or gallops noted. No JVD or BLE edema. No carotid bruits noted. Pulmonary/Chest: Normal effort and positive vesicular breath sounds. No respiratory distress. No wheezes, rales or ronchi noted.      Assessment & Plan:   Upper Respiratory Infection, new onset with additional workup required:  Get some rest and drink plenty of water Do salt water gargles for the sore throat eRx for Azithromax x 5 days   RTC as needed or if symptoms persist.

## 2012-06-08 NOTE — Patient Instructions (Signed)

## 2012-06-29 ENCOUNTER — Encounter: Payer: Self-pay | Admitting: Internal Medicine

## 2012-06-30 ENCOUNTER — Ambulatory Visit (INDEPENDENT_AMBULATORY_CARE_PROVIDER_SITE_OTHER): Payer: Medicare Other | Admitting: Internal Medicine

## 2012-06-30 ENCOUNTER — Encounter: Payer: Self-pay | Admitting: Internal Medicine

## 2012-06-30 ENCOUNTER — Ambulatory Visit (INDEPENDENT_AMBULATORY_CARE_PROVIDER_SITE_OTHER): Payer: Medicare Other

## 2012-06-30 VITALS — BP 142/70 | HR 81 | Temp 97.8°F | Ht 64.0 in | Wt 202.1 lb

## 2012-06-30 DIAGNOSIS — E785 Hyperlipidemia, unspecified: Secondary | ICD-10-CM

## 2012-06-30 DIAGNOSIS — Z0000 Encounter for general adult medical examination without abnormal findings: Secondary | ICD-10-CM

## 2012-06-30 DIAGNOSIS — E119 Type 2 diabetes mellitus without complications: Secondary | ICD-10-CM

## 2012-06-30 DIAGNOSIS — I10 Essential (primary) hypertension: Secondary | ICD-10-CM

## 2012-06-30 DIAGNOSIS — N318 Other neuromuscular dysfunction of bladder: Secondary | ICD-10-CM

## 2012-06-30 DIAGNOSIS — F411 Generalized anxiety disorder: Secondary | ICD-10-CM

## 2012-06-30 LAB — LIPID PANEL
Cholesterol: 262 mg/dL — ABNORMAL HIGH (ref 0–200)
Total CHOL/HDL Ratio: 5
VLDL: 43.2 mg/dL — ABNORMAL HIGH (ref 0.0–40.0)

## 2012-06-30 LAB — BASIC METABOLIC PANEL
Calcium: 9.9 mg/dL (ref 8.4–10.5)
Chloride: 96 mEq/L (ref 96–112)
Creatinine, Ser: 0.9 mg/dL (ref 0.4–1.2)
Sodium: 135 mEq/L (ref 135–145)

## 2012-06-30 LAB — HEPATIC FUNCTION PANEL
Alkaline Phosphatase: 74 U/L (ref 39–117)
Bilirubin, Direct: 0.1 mg/dL (ref 0.0–0.3)
Total Bilirubin: 0.7 mg/dL (ref 0.3–1.2)

## 2012-06-30 MED ORDER — LANCETS MISC: 1.0000 "application " | Freq: Every day | Status: DC

## 2012-06-30 NOTE — Assessment & Plan Note (Signed)
stable overall by history and exam, recent data reviewed with pt, and pt to continue medical treatment as before,  to f/u any worsening symptoms or concerns  For f/u labs today Lab Results  Component Value Date   HGBA1C 7.4* 03/01/2012

## 2012-06-30 NOTE — Assessment & Plan Note (Signed)
Encourage pt to start the detrol LA

## 2012-06-30 NOTE — Assessment & Plan Note (Signed)
stable overall by history and exam, recent data reviewed with pt, and pt to continue medical treatment as before,  to f/u any worsening symptoms or concerns BP Readings from Last 3 Encounters:  06/30/12 142/70  06/08/12 128/74  05/13/12 140/74

## 2012-06-30 NOTE — Patient Instructions (Signed)
Please continue all other medications as before, and refills have been done if requested. Please call if you change your mind about the citalopram OK to take the Detrol as you have OK to take tylenol as needed for pain as well Please go to the LAB in the Basement (turn left off the elevator) for the tests to be done today You will be contacted by phone if any changes need to be made immediately.  Otherwise, you will receive a letter about your results with an explanation  Please remember to sign up for My Chart if you have not done so, as this will be important to you in the future with finding out test results, communicating by private email, and scheduling acute appointments online when needed.  Please return in 6 months, or sooner if needed, with Lab testing done 3-5 days before

## 2012-06-30 NOTE — Progress Notes (Signed)
Subjective:    Patient ID: Laura [name], female    DOB: 11/05/1945, 67 y.o.   MRN: [id number]  HPI  Here to f/u; overall doing ok,  Pt denies chest pain, increased sob or doe, wheezing, orthopnea, PND, increased LE swelling, palpitations, dizziness or syncope.  Pt denies polydipsia, polyuria, or low sugar symptoms such as weakness or confusion improved with po intake.  Pt denies new neurological symptoms such as n facial or extremity weakness or numbness, has had several mild headaches, concerning to her as she does not have HA's often, pressure like to occipital and crown, slight to her but worries her.   Pt states overall good compliance with meds, has been trying to follow lower cholesterol, diabetic diet, with wt overall stable,  but little exercise however.  Vertigo seems to have improved.  C/o pain burning and tingling to skin and hand and fingers, shoulders without fever, swelling, trauma, feels hot and cold intermittent, a pain pill from a "neck specialist" helped last night (sounds like tramadol).  Has known right thumb DJD and c-spine djd/ddd, has seen Dr Merlyn Lot, told no CTS. Has some trouble doing her CBG's as the lancets she has dont seem to work well.  CBG's has been 188-228 twice last wk so took an extra half tab of glimeparide later in the day (usually take .5 mg in the am only). Had a uri with antibx now improved and sugars better, but still elevated, and sometimes eats at night as well. Did not take with detrol LA so still having OAB symptoms. BP at home has been 120's sbp. Denies worsening depressive symptoms, suicidal ideation, or panic; has ongoing anxiety, with increased recently due to finances mostly.  Mother has dementia, but pt sister doing most of the work taking care of her.  Pt denies fever, wt loss, night sweats, loss of appetite, or other constitutional symptoms Past Medical History  Diagnosis Date  . Diabetes mellitus   . Hyperlipidemia   . Hypertension   . Anxiety    . Reflux   . Vertigo   . Arthritis     back of neck, bones spurs on neck  . Sleep apnea     wears CPAP occasionally  . Cervical disc disease    Past Surgical History  Procedure Laterality Date  . Bladder surgery    . Cholecystectomy    . Partial hysterectomy    . Fibroids removed      breast (both breasts)  . Breast surgery      reports that she has never smoked. She has never used smokeless tobacco. She reports that she does not drink alcohol or use illicit drugs. family history includes Diabetes in her father, maternal aunt, and sister and Hypertension in her other.  There is no history of Colon cancer, and Esophageal cancer, and Stomach cancer, and Rectal cancer, . Allergies  Allergen Reactions  . Codeine     itch  . Crestor (Rosuvastatin Calcium) Other (See Comments)    Did something to memory   . Lipitor (Atorvastatin Calcium) Other (See Comments)    Makes weak   . Prednisone     Nervous    . Sulfa Antibiotics     Tongue swells, hives, itching   Current Outpatient Prescriptions on File Prior to Visit  Medication Sig Dispense Refill  . acetaminophen (TYLENOL) 325 MG tablet Take 650 mg by mouth every 6 (six) hours as needed for pain.      . cholecalciferol (VITAMIN D)  1000 UNITS tablet Take 1,000 Units by mouth daily.        . cyclobenzaprine (FLEXERIL) 10 MG tablet Take 1 tablet (10 mg total) by mouth 3 (three) times daily as needed for muscle spasms.  30 tablet  0  . glimepiride (AMARYL) 1 MG tablet Take 0.5 mg by mouth daily before breakfast. Take 1/2 tab  By mouth in the am      . loperamide (IMODIUM A-D) 2 MG tablet Take 1-2 mg by mouth 4 (four) times daily as needed for diarrhea or loose stools.      Marland Kitchen LORazepam (ATIVAN) 0.5 MG tablet Take 0.5 mg by mouth daily as needed for anxiety.      . Psyllium (METAMUCIL) 28.3 % POWD Take 5 mLs by mouth daily.       . simethicone (GAS-X) 80 MG chewable tablet Chew 80 mg by mouth 3 (three) times daily after meals.      .  tolterodine (DETROL LA) 4 MG 24 hr capsule Take 1 capsule (4 mg total) by mouth daily.  90 capsule  3  . trolamine salicylate (ASPERCREME) 10 % cream Apply 1 application topically 2 (two) times daily as needed (pain).      . valsartan-hydrochlorothiazide (DIOVAN HCT) 160-12.5 MG per tablet Take 1 tablet by mouth daily.  90 tablet  3  . glucose blood (ONE TOUCH ULTRA TEST) test strip Use as instructed  100 each  12  . omeprazole (PRILOSEC) 20 MG capsule Take 20 mg by mouth daily as needed (heart burn).       No current facility-administered medications on file prior to visit.   Past Medical History  Diagnosis Date  . Diabetes mellitus   . Hyperlipidemia   . Hypertension   . Anxiety   . Reflux   . Vertigo   . Arthritis     back of neck, bones spurs on neck  . Sleep apnea     wears CPAP occasionally  . Cervical disc disease    Past Surgical History  Procedure Laterality Date  . Bladder surgery    . Cholecystectomy    . Partial hysterectomy    . Fibroids removed      breast (both breasts)  . Breast surgery      reports that she has never smoked. She has never used smokeless tobacco. She reports that she does not drink alcohol or use illicit drugs. family history includes Diabetes in her father, maternal aunt, and sister and Hypertension in her other.  There is no history of Colon cancer, and Esophageal cancer, and Stomach cancer, and Rectal cancer, . Allergies  Allergen Reactions  . Codeine     itch  . Crestor (Rosuvastatin Calcium) Other (See Comments)    Did something to memory   . Lipitor (Atorvastatin Calcium) Other (See Comments)    Makes weak   . Prednisone     Nervous    . Sulfa Antibiotics     Tongue swells, hives, itching   Current Outpatient Prescriptions on File Prior to Visit  Medication Sig Dispense Refill  . acetaminophen (TYLENOL) 325 MG tablet Take 650 mg by mouth every 6 (six) hours as needed for pain.      . cholecalciferol (VITAMIN D) 1000 UNITS  tablet Take 1,000 Units by mouth daily.        . cyclobenzaprine (FLEXERIL) 10 MG tablet Take 1 tablet (10 mg total) by mouth 3 (three) times daily as needed for muscle spasms.  30 tablet  0  . glimepiride (AMARYL) 1 MG tablet Take 0.5 mg by mouth daily before breakfast. Take 1/2 tab  By mouth in the am      . loperamide (IMODIUM A-D) 2 MG tablet Take 1-2 mg by mouth 4 (four) times daily as needed for diarrhea or loose stools.      Marland Kitchen LORazepam (ATIVAN) 0.5 MG tablet Take 0.5 mg by mouth daily as needed for anxiety.      . Psyllium (METAMUCIL) 28.3 % POWD Take 5 mLs by mouth daily.       . simethicone (GAS-X) 80 MG chewable tablet Chew 80 mg by mouth 3 (three) times daily after meals.      . tolterodine (DETROL LA) 4 MG 24 hr capsule Take 1 capsule (4 mg total) by mouth daily.  90 capsule  3  . trolamine salicylate (ASPERCREME) 10 % cream Apply 1 application topically 2 (two) times daily as needed (pain).      . valsartan-hydrochlorothiazide (DIOVAN HCT) 160-12.5 MG per tablet Take 1 tablet by mouth daily.  90 tablet  3  . glucose blood (ONE TOUCH ULTRA TEST) test strip Use as instructed  100 each  12  . omeprazole (PRILOSEC) 20 MG capsule Take 20 mg by mouth daily as needed (heart burn).       No current facility-administered medications on file prior to visit.   Review of Systems  Constitutional: Negative for unexpected weight change, or unusual diaphoresis  HENT: Negative for tinnitus.   Eyes: Negative for photophobia and visual disturbance.  Respiratory: Negative for choking and stridor.   Gastrointestinal: Negative for vomiting and blood in stool.  Genitourinary: Negative for hematuria and decreased urine volume.  Musculoskeletal: Negative for acute joint swelling Skin: Negative for color change and wound.  Neurological: Negative for tremors and numbness other than noted  Psychiatric/Behavioral: Negative for decreased concentration or  hyperactivity.       Objective:   Physical  Exam BP 142/70  Pulse 81  Temp(Src) 97.8 F (36.6 C) (Oral)  Ht 5\' 4"  (1.626 m)  Wt 202 lb 2 oz (91.683 kg)  BMI 34.68 kg/m2  SpO2 97% VS noted,  Constitutional: Pt appears well-developed and well-nourished.  HENT: Head: NCAT.  Right Ear: External ear normal.  Left Ear: External ear normal.  Eyes: Conjunctivae and EOM are normal. Pupils are equal, round, and reactive to light.  Neck: Normal range of motion. Neck supple.  Cardiovascular: Normal rate and regular rhythm.   Pulmonary/Chest: Effort normal and breath sounds normal.  Abd:  Soft, NT, non-distended, + BS Neurological: Pt is alert. Not confused  Skin: Skin is warm. No erythema.  Psychiatric: Pt behavior is normal. Thought content normal. 2+ nervous    Assessment & Plan:

## 2012-06-30 NOTE — Assessment & Plan Note (Signed)
Lab Results  Component Value Date   LDLCALC 160* 01/05/2011   Severe ,has declined statin as fear of polypharmacy, for lower chol diet

## 2012-06-30 NOTE — Assessment & Plan Note (Signed)
remaiins significant, again declines ssri trial, prefers exercise, cousneling on her own, and ativan prn,  to f/u any worsening symptoms or concerns

## 2012-07-01 ENCOUNTER — Other Ambulatory Visit: Payer: Self-pay | Admitting: Internal Medicine

## 2012-07-01 ENCOUNTER — Telehealth: Payer: Self-pay

## 2012-07-01 MED ORDER — GLIMEPIRIDE 1 MG PO TABS: 0.5000 mg | ORAL_TABLET | Freq: Two times a day (BID) | ORAL | Status: DC

## 2012-07-01 MED ORDER — PRAVASTATIN SODIUM 40 MG PO TABS: 40.0000 mg | ORAL_TABLET | Freq: Every day | ORAL | Status: DC

## 2012-07-01 NOTE — Telephone Encounter (Signed)
There are 2 sets of directions on Glimepiride Pharmacy needs clarification 1/2 BID or 1/2 every morning.

## 2012-07-01 NOTE — Telephone Encounter (Signed)
Pharmacy informed.

## 2012-07-01 NOTE — Telephone Encounter (Signed)
1/2 bid is the new correct one

## 2012-07-19 ENCOUNTER — Encounter: Payer: Self-pay | Admitting: Internal Medicine

## 2012-07-19 MED ORDER — VALSARTAN-HYDROCHLOROTHIAZIDE 160-12.5 MG PO TABS: 1.0000 | ORAL_TABLET | Freq: Every day | ORAL | Status: DC

## 2012-07-19 NOTE — Telephone Encounter (Signed)
Ok to take HALf of the diovan HCT  To cont all other meds as is

## 2012-07-20 NOTE — Telephone Encounter (Signed)
I think this has already been addressed; see below in decreased diovan hct  High sugar very unlikely to be causing her symptoms, and meds very unlikely to be anything significant in leading to higher sugars

## 2012-07-20 NOTE — Telephone Encounter (Signed)
Called the patient and she is taking meds. At night and waking in the am feeling nausea, dizzy, diarrhea, itching and hot flashes.  She did not take her cholesterol one night and woke feeling a little better. BS in the am average 178. Checked in the evening was 228 took an extra dose of medication and went down to 148.  The patient

## 2012-07-26 NOTE — Telephone Encounter (Signed)
Already addressed  On July 8, my answer was  Corwin Levins, MD at 07/20/2012 10:27 AM   Status: Signed            I think this has already been addressed; see below in decreased diovan hct  High sugar very unlikely to be causing her symptoms, and meds very unlikely to be anything significant in leading to higher sugars   OK to cont other meds, consider OV with me or regina if having persistent problems

## 2012-07-27 ENCOUNTER — Encounter: Payer: Self-pay | Admitting: Internal Medicine

## 2012-07-29 ENCOUNTER — Ambulatory Visit: Payer: Self-pay | Admitting: Internal Medicine

## 2012-08-05 ENCOUNTER — Other Ambulatory Visit: Payer: Self-pay | Admitting: *Deleted

## 2012-08-05 MED ORDER — LORAZEPAM 0.5 MG PO TABS: 0.5000 mg | ORAL_TABLET | Freq: Every day | ORAL | Status: DC | PRN

## 2012-08-05 NOTE — Telephone Encounter (Signed)
Md out of office. Pls advise...lmb

## 2012-08-05 NOTE — Telephone Encounter (Signed)
Faxed script back to [facility]...lmb

## 2012-08-16 ENCOUNTER — Telehealth: Payer: Self-pay | Admitting: *Deleted

## 2012-08-16 NOTE — Telephone Encounter (Signed)
[facility] Triage Call Report Triage Record Num: [id number] Operator: Revonda Humphrey Patient Name: Laura [name] Call Date & Time: 08/15/2012 8:49:34AM Patient Phone: [phone] PCP: Oliver Barre Patient Gender: Female PCP Fax : [phone] Patient DOB: Feb 25, 1945 Practice Name: [facility] Reason for Call: Caller: Kerigan/Patient; PCP: Oliver Barre (Adults only); CB#: [phone]; Call regarding Blood Sugar Running High for Her.. Blurred Vision; Has noticed blood sugars higher than usual. Taking Amaryl 1mg  1/2 tab BID at breakfast, sometimes forgets for supper and will take as late as 9 pm. Has had blurred vision for 3 days, sometimes slightly dizzy. Hx of vertigo, dizziness for extended time. Blood sugars running 141, 147, 148 in the mornings. Blood sugar to 170 at 9 pm last night so took her 1/2 supper pill at that time. Blood sugar 194 at 1 am today 8/3 so took extra Amaryl 1/2 tab at that time. This am blood sugar 148. Blurred vision gradual onset, varies in intensity. Afebrile. Triaged in Diabetes Control Problems and Eye Pain or Vision Change Guidelines - Disposition: See Provider within 72 hours due to gradual decline in visual acuity affecting one or both eyes. Plans follow up with MD tomorrow 8/4 Protocol(s) Used: Eye: Pain or Vision Change Recommended Outcome per Protocol: See Provider within 72 Hours Reason for Outcome: Gradual (days to weeks) decline in visual acuity affecting one or both eyes Care Advice: ~ 08/

## 2012-08-18 ENCOUNTER — Other Ambulatory Visit: Payer: Self-pay

## 2012-08-21 ENCOUNTER — Other Ambulatory Visit: Payer: Self-pay | Admitting: Internal Medicine

## 2012-08-27 ENCOUNTER — Encounter: Payer: Self-pay | Admitting: Internal Medicine

## 2012-09-15 ENCOUNTER — Telehealth: Payer: Self-pay | Admitting: Internal Medicine

## 2012-09-15 ENCOUNTER — Ambulatory Visit (INDEPENDENT_AMBULATORY_CARE_PROVIDER_SITE_OTHER): Payer: Medicare Other | Admitting: Internal Medicine

## 2012-09-15 ENCOUNTER — Encounter: Payer: Self-pay | Admitting: Internal Medicine

## 2012-09-15 VITALS — BP 140/64 | HR 82 | Temp 97.1°F | Ht 64.0 in | Wt 199.5 lb

## 2012-09-15 DIAGNOSIS — R42 Dizziness and giddiness: Secondary | ICD-10-CM

## 2012-09-15 DIAGNOSIS — R21 Rash and other nonspecific skin eruption: Secondary | ICD-10-CM

## 2012-09-15 DIAGNOSIS — I10 Essential (primary) hypertension: Secondary | ICD-10-CM

## 2012-09-15 MED ORDER — OMEPRAZOLE 20 MG PO CPDR: 20.0000 mg | DELAYED_RELEASE_CAPSULE | Freq: Every day | ORAL | Status: DC

## 2012-09-15 MED ORDER — CLOTRIMAZOLE-BETAMETHASONE 1-0.05 % EX CREA: TOPICAL_CREAM | CUTANEOUS | Status: DC

## 2012-09-15 NOTE — Telephone Encounter (Signed)
Patient Information:  Caller Name: Bryauna  Phone: [phone]  Patient: Laura [name], Laura [name]  Gender: Female  DOB: 10-10-1945  Age: 67 Years  PCP: Oliver Barre (Adults only)  Office Follow Up:  Does the office need to follow up with this patient?: No  Instructions For The Office: N/A   Symptoms  Reason For Call & Symptoms: Patient reports she has rash / redness  in groin area and left thigh from front to back.  See Today in Office per Rash or Redness - Localized guideline due to "Localized rash is very painful (no fever).  Home care for the interim and parameters for callback given.  Reviewed Health History In EMR: Yes  Reviewed Medications In EMR: Yes  Reviewed Allergies In EMR: Yes  Reviewed Surgeries / Procedures: Yes  Date of Onset of Symptoms: 09/05/2012  Treatments Tried: Hydrocortisone Cream  Treatments Tried Worked: No  Guideline(s) Used:  Rash or Redness - Localized  Disposition Per Guideline:   See Today in Office  Reason For Disposition Reached:   Localized rash is very painful (no fever)  Advice Given:  Avoid Soap:  Wash the area once thoroughly with soap to remove any remaining irritants. Thereafter avoid soaps to this area. Cleanse the area when needed with warm water.  Apply Cold to the Area:  Apply or soak in cold water for 20 minutes every 3 to 4 hours to reduce itching or pain.  Call Back If:  You become worse.  Patient Will Follow Care Advice:  YES  Appointment Scheduled:  09/15/2012 15:30:00 Appointment Scheduled Provider:  Oliver Barre (Adults only)

## 2012-09-15 NOTE — Patient Instructions (Signed)
Please take all new medication as prescribed - the cream Please continue all other medications as before Please have the pharmacy call with any other refills you may need.  You will be contacted regarding the referral for: vestibular rehab (PT)  Please remember to sign up for My Chart if you have not done so, as this will be important to you in the future with finding out test results, communicating by private email, and scheduling acute appointments online when needed.

## 2012-09-15 NOTE — Progress Notes (Signed)
Subjective:    Patient ID: Laura [name], female    DOB: [birth year], 67 y.o.   MRN: [id number]  HPI  Here with rash to left inguinal ligament area with itch and mild discomfort for over 1 wk, not better with calamine, getting slightly larger over the past wk.  No prior hx.   Also with recurrent dizziness, where vestibular rehab worked well but became expensive earlier this yr, so had to quit.  Does have information to do the excercises on her own at home.  Is willing to try the PT again as her finances have improved. Denies worsening depressive symptoms, suicidal ideation, or panic; has ongoing anxiety.    Pt denies chest pain, increased sob or doe, wheezing, orthopnea, PND, increased LE swelling, palpitations, dizziness or syncope.  Pt denies new neurological symptoms such as new headache, or facial or extremity weakness or numbness Past Medical History  Diagnosis Date  . Diabetes mellitus   . Hyperlipidemia   . Hypertension   . Anxiety   . Reflux   . Vertigo   . Arthritis     back of neck, bones spurs on neck  . Sleep apnea     wears CPAP occasionally  . Cervical disc disease    Past Surgical History  Procedure Laterality Date  . Bladder surgery    . Cholecystectomy    . Partial hysterectomy    . Fibroids removed      breast (both breasts)  . Breast surgery      reports that she has never smoked. She has never used smokeless tobacco. She reports that she does not drink alcohol or use illicit drugs. family history includes Diabetes in her father, maternal aunt, and sister; Hypertension in her other. There is no history of Colon cancer, Esophageal cancer, Stomach cancer, or Rectal cancer. Allergies  Allergen Reactions  . Codeine     itch  . Crestor [Rosuvastatin Calcium] Other (See Comments)    Did something to memory   . Lipitor [Atorvastatin Calcium] Other (See Comments)    Makes weak   . Prednisone     Nervous    . Sulfa Antibiotics     Tongue swells, hives,  itching   Current Outpatient Prescriptions on File Prior to Visit  Medication Sig Dispense Refill  . acetaminophen (TYLENOL) 325 MG tablet Take 650 mg by mouth every 6 (six) hours as needed for pain.      . cholecalciferol (VITAMIN D) 1000 UNITS tablet Take 1,000 Units by mouth daily.        Marland Kitchen glimepiride (AMARYL) 1 MG tablet Take 0.5 tablets (0.5 mg total) by mouth 2 (two) times daily.  90 tablet  3  . Lancets MISC 1 application by Does not apply route daily.  100 each  11  . Lancets MISC 1 application by Does not apply route daily.  100 each  11  . loperamide (IMODIUM A-D) 2 MG tablet Take 1-2 mg by mouth 4 (four) times daily as needed for diarrhea or loose stools.      Marland Kitchen LORazepam (ATIVAN) 0.5 MG tablet Take 1 tablet (0.5 mg total) by mouth daily as needed for anxiety.  30 tablet  0  . pravastatin (PRAVACHOL) 40 MG tablet Take 1 tablet (40 mg total) by mouth daily.  90 tablet  3  . Psyllium (METAMUCIL) 28.3 % POWD Take 5 mLs by mouth daily.       . simethicone (GAS-X) 80 MG chewable tablet Chew 80 mg by  mouth 3 (three) times daily after meals.      . tolterodine (DETROL LA) 4 MG 24 hr capsule Take 1 capsule (4 mg total) by mouth daily.  90 capsule  3  . trolamine salicylate (ASPERCREME) 10 % cream Apply 1 application topically 2 (two) times daily as needed (pain).      . valsartan-hydrochlorothiazide (DIOVAN HCT) 160-12.5 MG per tablet Take 1 tablet by mouth daily.  45 tablet  3  . cyclobenzaprine (FLEXERIL) 10 MG tablet Take 1 tablet (10 mg total) by mouth 3 (three) times daily as needed for muscle spasms.  30 tablet  0  . glucose blood (ONE TOUCH ULTRA TEST) test strip Use as instructed  100 each  12   No current facility-administered medications on file prior to visit.   Review of Systems  Constitutional: Negative for unexpected weight change, or unusual diaphoresis  HENT: Negative for tinnitus.   Eyes: Negative for photophobia and visual disturbance.  Respiratory: Negative for  choking and stridor.   Gastrointestinal: Negative for vomiting and blood in stool.  Genitourinary: Negative for hematuria and decreased urine volume.  Musculoskeletal: Negative for acute joint swelling Skin: Negative for color change and wound.  Neurological: Negative for tremors and numbness other than noted  Psychiatric/Behavioral: Negative for decreased concentration or  hyperactivity.       Objective:   Physical Exam BP 140/64  Pulse 82  Temp(Src) 97.1 F (36.2 C) (Oral)  Ht 5\' 4"  (1.626 m)  Wt 199 lb 8 oz (90.493 kg)  BMI 34.23 kg/m2  SpO2 96% VS noted,  Constitutional: Pt appears well-developed and well-nourished.  HENT: Head: NCAT.  Right Ear: External ear normal.  Left Ear: External ear normal.  Eyes: Conjunctivae and EOM are normal. Pupils are equal, round, and reactive to light.  Neck: Normal range of motion. Neck supple.  Cardiovascular: Normal rate and regular rhythm.   Pulmonary/Chest: Effort normal and breath sounds normal.  Abd:  Soft, NT, non-distended, + BS Neurological: Pt is alert. Not confused , motor 5/5, FTN intact Skin: Skin is warm. No erythema. Left inguinal ligament area with 1 x 4 cm area nontender erythema with slight maceration, has some mild pannus Psychiatric: Pt behavior is normal. Thought content normal. mild nervous    Assessment & Plan:

## 2012-09-19 DIAGNOSIS — R21 Rash and other nonspecific skin eruption: Secondary | ICD-10-CM | POA: Insufficient documentation

## 2012-09-19 NOTE — Assessment & Plan Note (Signed)
stable overall by history and exam, recent data reviewed with pt, and pt to continue medical treatment as before,  to f/u any worsening symptoms or concerns BP Readings from Last 3 Encounters:  09/15/12 140/64  06/30/12 142/70  06/08/12 128/74

## 2012-09-19 NOTE — Assessment & Plan Note (Signed)
Ok for vestibular rehab re-start, will refer

## 2012-09-19 NOTE — Assessment & Plan Note (Signed)
For lotrisone prn,  to f/u any worsening symptoms or concerns  

## 2012-10-07 ENCOUNTER — Encounter: Payer: Self-pay | Admitting: Internal Medicine

## 2012-10-07 ENCOUNTER — Ambulatory Visit (INDEPENDENT_AMBULATORY_CARE_PROVIDER_SITE_OTHER): Payer: Medicare Other | Admitting: Internal Medicine

## 2012-10-07 VITALS — BP 142/78 | HR 75 | Temp 97.6°F | Ht 64.0 in | Wt 202.5 lb

## 2012-10-07 DIAGNOSIS — I10 Essential (primary) hypertension: Secondary | ICD-10-CM

## 2012-10-07 DIAGNOSIS — E119 Type 2 diabetes mellitus without complications: Secondary | ICD-10-CM

## 2012-10-07 DIAGNOSIS — J329 Chronic sinusitis, unspecified: Secondary | ICD-10-CM

## 2012-10-07 DIAGNOSIS — R42 Dizziness and giddiness: Secondary | ICD-10-CM

## 2012-10-07 DIAGNOSIS — H6690 Otitis media, unspecified, unspecified ear: Secondary | ICD-10-CM

## 2012-10-07 DIAGNOSIS — H6692 Otitis media, unspecified, left ear: Secondary | ICD-10-CM | POA: Insufficient documentation

## 2012-10-07 MED ORDER — AMOXICILLIN 500 MG PO CAPS: 1000.0000 mg | ORAL_CAPSULE | Freq: Two times a day (BID) | ORAL | Status: DC

## 2012-10-07 MED ORDER — FLUTICASONE PROPIONATE 50 MCG/ACT NA SUSP: 2.0000 | Freq: Every day | NASAL | Status: DC

## 2012-10-07 NOTE — Progress Notes (Signed)
Subjective:    Patient ID: Laura [name], female    DOB: 04-05-1945, 67 y.o.   MRN: [id number]  HPI  Here to f/u with c/o 2-3 days left ear pain, pressure, dizziness, fever, HA, general weakness and malaise.  C/o never being called for  PT/vestib rehab, BP elevation seems assoc with ongoing dizziness, worse with brushing teeth.  Has ongoing chronic sinus with pt denies chest pain, wheezing, increased sob or doe, orthopnea, PND, increased LE swelling, palpitations, dizziness or syncope. Pt continues to have recurring LBP without change in severity, bowel or bladder change, fever, wt loss,  worsening LE pain/numbness/weakness, gait change or falls.   Pt denies polydipsia, polyuria, or low sugar symptoms such as weakness or confusion improved with po intake.  Pt states overall good compliance with meds, trying to follow lower cholesterol, diabetic diet. Past Medical History  Diagnosis Date  . Diabetes mellitus   . Hyperlipidemia   . Hypertension   . Anxiety   . Reflux   . Vertigo   . Arthritis     back of neck, bones spurs on neck  . Sleep apnea     wears CPAP occasionally  . Cervical disc disease    Past Surgical History  Procedure Laterality Date  . Bladder surgery    . Cholecystectomy    . Partial hysterectomy    . Fibroids removed      breast (both breasts)  . Breast surgery      reports that she has never smoked. She has never used smokeless tobacco. She reports that she does not drink alcohol or use illicit drugs. family history includes Diabetes in her father, maternal aunt, and sister; Hypertension in her other. There is no history of Colon cancer, Esophageal cancer, Stomach cancer, or Rectal cancer. Allergies  Allergen Reactions  . Codeine     itch  . Crestor [Rosuvastatin Calcium] Other (See Comments)    Did something to memory   . Lipitor [Atorvastatin Calcium] Other (See Comments)    Makes weak   . Prednisone     Nervous    . Sulfa Antibiotics     Tongue swells,  hives, itching   Current Outpatient Prescriptions on File Prior to Visit  Medication Sig Dispense Refill  . acetaminophen (TYLENOL) 325 MG tablet Take 650 mg by mouth every 6 (six) hours as needed for pain.      . cholecalciferol (VITAMIN D) 1000 UNITS tablet Take 1,000 Units by mouth daily.        . clotrimazole-betamethasone (LOTRISONE) cream Use as directed twice per day  15 g  1  . cyclobenzaprine (FLEXERIL) 10 MG tablet Take 1 tablet (10 mg total) by mouth 3 (three) times daily as needed for muscle spasms.  30 tablet  0  . glimepiride (AMARYL) 1 MG tablet Take 0.5 tablets (0.5 mg total) by mouth 2 (two) times daily.  90 tablet  3  . Lancets MISC 1 application by Does not apply route daily.  100 each  11  . Lancets MISC 1 application by Does not apply route daily.  100 each  11  . loperamide (IMODIUM A-D) 2 MG tablet Take 1-2 mg by mouth 4 (four) times daily as needed for diarrhea or loose stools.      Marland Kitchen LORazepam (ATIVAN) 0.5 MG tablet Take 1 tablet (0.5 mg total) by mouth daily as needed for anxiety.  30 tablet  0  . omeprazole (PRILOSEC) 20 MG capsule Take 1 capsule (20 mg total)  by mouth daily.  90 capsule  3  . pravastatin (PRAVACHOL) 40 MG tablet Take 1 tablet (40 mg total) by mouth daily.  90 tablet  3  . Psyllium (METAMUCIL) 28.3 % POWD Take 5 mLs by mouth daily.       . simethicone (GAS-X) 80 MG chewable tablet Chew 80 mg by mouth 3 (three) times daily after meals.      . tolterodine (DETROL LA) 4 MG 24 hr capsule Take 1 capsule (4 mg total) by mouth daily.  90 capsule  3  . trolamine salicylate (ASPERCREME) 10 % cream Apply 1 application topically 2 (two) times daily as needed (pain).      . valsartan-hydrochlorothiazide (DIOVAN HCT) 160-12.5 MG per tablet Take 1 tablet by mouth daily.  45 tablet  3  . glucose blood (ONE TOUCH ULTRA TEST) test strip Use as instructed  100 each  12   No current facility-administered medications on file prior to visit.   Review of Systems   Constitutional: Negative for unexpected weight change, or unusual diaphoresis  HENT: Negative for tinnitus.   Eyes: Negative for photophobia and visual disturbance.  Respiratory: Negative for choking and stridor.   Gastrointestinal: Negative for vomiting and blood in stool.  Genitourinary: Negative for hematuria and decreased urine volume.  Musculoskeletal: Negative for acute joint swelling Skin: Negative for color change and wound.  Neurological: Negative for tremors and numbness other than noted  Psychiatric/Behavioral: Negative for decreased concentration or  hyperactivity.       Objective:   Physical Exam BP 142/78  Pulse 75  Temp(Src) 97.6 F (36.4 C) (Oral)  Ht 5\' 4"  (1.626 m)  Wt 202 lb 8 oz (91.853 kg)  BMI 34.74 kg/m2  SpO2 99% VS noted, mild ill Constitutional: Pt appears well-developed and well-nourished.  HENT: Head: NCAT.  Right Ear: External ear normal.  Left Ear: External ear normal. Left TM with severe erythema Eyes: Conjunctivae and EOM are normal. Pupils are equal, round, and reactive to light.  Neck: Normal range of motion. Neck supple.  Cardiovascular: Normal rate and regular rhythm.   Pulmonary/Chest: Effort normal and breath sounds normal.  Abd:  Soft, NT, non-distended, + BS Neurological: Pt is alert. Not confused  Skin: Skin is warm. No erythema.  Psychiatric: Pt behavior is normal. Thought content normal.     Assessment & Plan:

## 2012-10-07 NOTE — Patient Instructions (Signed)
Please take all new medication as prescribed - the antibiotic, and flonase Please continue all other medications as before, and refills have been done if requested. Please have the pharmacy call with any other refills you may need. OK to try the meclizine you have at home at HALF of the 12.5 mg Please continue to monitor your blood pressure and sugars as you do  Please remember to sign up for My Chart if you have not done so, as this will be important to you in the future with finding out test results, communicating by private email, and scheduling acute appointments online when needed.

## 2012-10-09 ENCOUNTER — Encounter: Payer: Self-pay | Admitting: Internal Medicine

## 2012-10-09 DIAGNOSIS — G4733 Obstructive sleep apnea (adult) (pediatric): Secondary | ICD-10-CM

## 2012-10-10 NOTE — Assessment & Plan Note (Signed)
stable overall by history and exam, recent data reviewed with pt, and pt to continue medical treatment as before,  to f/u any worsening symptoms or concerns Lab Results  Component Value Date   HGBA1C 7.6* 06/30/2012

## 2012-10-10 NOTE — Assessment & Plan Note (Signed)
stable overall by history and exam, recent data reviewed with pt, and pt to continue medical treatment as before,  to f/u any worsening symptoms or concerns BP Readings from Last 3 Encounters:  10/07/12 142/78  09/15/12 140/64  06/30/12 142/70

## 2012-10-10 NOTE — Assessment & Plan Note (Signed)
Mild to mod, for antibx course,  to f/u any worsening symptoms or concerns 

## 2012-10-10 NOTE — Assessment & Plan Note (Signed)
Mild to mod, to add flonase asd  to f/u any worsening symptoms or concerns

## 2012-10-10 NOTE — Assessment & Plan Note (Signed)
Chronic recurrent, has meclizine prn

## 2012-10-11 ENCOUNTER — Encounter: Payer: Self-pay | Admitting: Internal Medicine

## 2012-10-12 MED ORDER — MECLIZINE HCL 12.5 MG PO TABS: 12.5000 mg | ORAL_TABLET | Freq: Three times a day (TID) | ORAL | Status: DC | PRN

## 2012-10-14 NOTE — Addendum Note (Signed)
Addended by: Corwin Levins on: 10/14/2012 08:38 AM   Modules accepted: Orders

## 2012-10-19 ENCOUNTER — Encounter: Payer: Self-pay | Admitting: Internal Medicine

## 2012-10-20 ENCOUNTER — Telehealth: Payer: Self-pay | Admitting: *Deleted

## 2012-10-20 DIAGNOSIS — R42 Dizziness and giddiness: Secondary | ICD-10-CM

## 2012-10-20 MED ORDER — FLUCONAZOLE 150 MG PO TABS: ORAL_TABLET | ORAL | Status: DC

## 2012-10-20 NOTE — Telephone Encounter (Signed)
Order done per emr

## 2012-10-20 NOTE — Telephone Encounter (Signed)
Angie from [facility] called requesting order for PT due to pts vertigo.  Please advise

## 2012-10-21 ENCOUNTER — Ambulatory Visit: Payer: Medicare Other | Attending: Internal Medicine | Admitting: Physical Therapy

## 2012-10-21 DIAGNOSIS — R269 Unspecified abnormalities of gait and mobility: Secondary | ICD-10-CM | POA: Insufficient documentation

## 2012-10-21 DIAGNOSIS — IMO0001 Reserved for inherently not codable concepts without codable children: Secondary | ICD-10-CM | POA: Insufficient documentation

## 2012-10-21 DIAGNOSIS — R42 Dizziness and giddiness: Secondary | ICD-10-CM | POA: Insufficient documentation

## 2012-11-10 ENCOUNTER — Encounter: Payer: Self-pay | Admitting: Internal Medicine

## 2012-11-18 ENCOUNTER — Other Ambulatory Visit: Payer: Self-pay

## 2012-11-23 ENCOUNTER — Institutional Professional Consult (permissible substitution): Payer: Self-pay | Admitting: Pulmonary Disease

## 2012-12-07 ENCOUNTER — Ambulatory Visit (INDEPENDENT_AMBULATORY_CARE_PROVIDER_SITE_OTHER): Payer: Medicare Other

## 2012-12-07 DIAGNOSIS — Z23 Encounter for immunization: Secondary | ICD-10-CM

## 2012-12-16 ENCOUNTER — Telehealth: Payer: Self-pay | Admitting: Internal Medicine

## 2012-12-16 NOTE — Telephone Encounter (Signed)
Pt called to say her BP is high .  Last night it was 169/93.  This morning 175/92.  She takes her medicine at night.  She took her medicine after the reading for last night.  She has had some dizziness for several days.  No nausea.  But off balance.  No headaches.

## 2012-12-16 NOTE — Telephone Encounter (Signed)
LMOM to call for an appt. °

## 2012-12-16 NOTE — Telephone Encounter (Signed)
Hard to recommend what to do, as the BP elevation could be a reaction to the reason for the dizziness/off balance.  Please consider OV

## 2012-12-17 ENCOUNTER — Encounter: Payer: Self-pay | Admitting: Internal Medicine

## 2012-12-17 ENCOUNTER — Other Ambulatory Visit (INDEPENDENT_AMBULATORY_CARE_PROVIDER_SITE_OTHER): Payer: Medicare Other

## 2012-12-17 ENCOUNTER — Ambulatory Visit (INDEPENDENT_AMBULATORY_CARE_PROVIDER_SITE_OTHER): Payer: Medicare Other | Admitting: Internal Medicine

## 2012-12-17 VITALS — BP 154/68 | HR 85 | Temp 98.2°F | Wt 205.1 lb

## 2012-12-17 DIAGNOSIS — R350 Frequency of micturition: Secondary | ICD-10-CM

## 2012-12-17 DIAGNOSIS — R509 Fever, unspecified: Secondary | ICD-10-CM

## 2012-12-17 DIAGNOSIS — I10 Essential (primary) hypertension: Secondary | ICD-10-CM

## 2012-12-17 LAB — URINALYSIS, ROUTINE W REFLEX MICROSCOPIC
Bilirubin Urine: NEGATIVE
Hgb urine dipstick: NEGATIVE
Ketones, ur: NEGATIVE
Nitrite: NEGATIVE
Specific Gravity, Urine: 1.01 (ref 1.000–1.030)
Total Protein, Urine: NEGATIVE
Urine Glucose: NEGATIVE
pH: 6 (ref 5.0–8.0)

## 2012-12-17 MED ORDER — CEFTRIAXONE SODIUM 1 G IJ SOLR
1.0000 g | Freq: Once | INTRAMUSCULAR | Status: AC
  Administered 2012-12-17: 1 g via INTRAMUSCULAR

## 2012-12-17 MED ORDER — LEVOFLOXACIN 250 MG PO TABS: 250.0000 mg | ORAL_TABLET | Freq: Every day | ORAL | Status: DC

## 2012-12-17 MED ORDER — FLUCONAZOLE 150 MG PO TABS: ORAL_TABLET | ORAL | Status: DC

## 2012-12-17 NOTE — Assessment & Plan Note (Signed)
Subjective, does appear mild ill but o/w stable, exam without specific source - ? Viral,  to f/u any worsening symptoms or concerns

## 2012-12-17 NOTE — Telephone Encounter (Signed)
Appt on Dec 5.

## 2012-12-17 NOTE — Progress Notes (Signed)
Pre-visit discussion using our clinic review tool. No additional management support is needed unless otherwise documented below in the visit note.  

## 2012-12-17 NOTE — Assessment & Plan Note (Signed)
Mild elevation, likely reactive,  to f/u any worsening symptoms or concerns

## 2012-12-17 NOTE — Patient Instructions (Signed)
You had the antibiotic shot today- Rocephin  Please take all new medication as prescribed - the antibiotic, and yeast infection medication Please continue all other medications as before, and refills have been done if requested.  Please go to the LAB in the Basement (turn left off the elevator) for the tests to be done today - just the urine testing

## 2012-12-17 NOTE — Progress Notes (Signed)
Subjective:    Patient ID: Laura [name], female    DOB: January 30, 1945, 67 y.o.   MRN: [id number]  HPI  Here with somewhat vague acute symptoms, c/o fatigue, sleeping more, feverish/feeling warm, HA, dry eyes and urinary frequency with small amounts and some hesitancy.  No specific ST, sinus symptoms, cough, and Pt denies chest pain, increased sob or doe, wheezing, orthopnea, PND, increased LE swelling, palpitations, dizziness or syncope.  Pt denies new neurological symptoms such as new headache, or facial or extremity weakness or numbness   Pt denies polydipsia, polyuria.  Denies other urinary symptoms such as dysuria,  urgency, flank pain, hematuria.  BP has been mildly elevated, as well as CBG 177 this am.   Past Medical History  Diagnosis Date  . Diabetes mellitus   . Hyperlipidemia   . Hypertension   . Anxiety   . Reflux   . Vertigo   . Arthritis     back of neck, bones spurs on neck  . Sleep apnea     wears CPAP occasionally  . Cervical disc disease    Past Surgical History  Procedure Laterality Date  . Bladder surgery    . Cholecystectomy    . Partial hysterectomy    . Fibroids removed      breast (both breasts)  . Breast surgery      reports that she has never smoked. She has never used smokeless tobacco. She reports that she does not drink alcohol or use illicit drugs. family history includes Diabetes in her father, maternal aunt, and sister; Hypertension in her other. There is no history of Colon cancer, Esophageal cancer, Stomach cancer, or Rectal cancer. Allergies  Allergen Reactions  . Codeine     itch  . Crestor [Rosuvastatin Calcium] Other (See Comments)    Did something to memory   . Lipitor [Atorvastatin Calcium] Other (See Comments)    Makes weak   . Prednisone     Nervous    . Sulfa Antibiotics     Tongue swells, hives, itching   Current Outpatient Prescriptions on File Prior to Visit  Medication Sig Dispense Refill  . acetaminophen (TYLENOL) 325 MG  tablet Take 650 mg by mouth every 6 (six) hours as needed for pain.      Marland Kitchen amoxicillin (AMOXIL) 500 MG capsule Take 2 capsules (1,000 mg total) by mouth 2 (two) times daily.  40 capsule  0  . cholecalciferol (VITAMIN D) 1000 UNITS tablet Take 1,000 Units by mouth daily.        . clotrimazole-betamethasone (LOTRISONE) cream Use as directed twice per day  15 g  1  . cyclobenzaprine (FLEXERIL) 10 MG tablet Take 1 tablet (10 mg total) by mouth 3 (three) times daily as needed for muscle spasms.  30 tablet  0  . fluticasone (FLONASE) 50 MCG/ACT nasal spray Place 2 sprays into the nose daily.  16 g  5  . glimepiride (AMARYL) 1 MG tablet Take 0.5 tablets (0.5 mg total) by mouth 2 (two) times daily.  90 tablet  3  . Lancets MISC 1 application by Does not apply route daily.  100 each  11  . Lancets MISC 1 application by Does not apply route daily.  100 each  11  . loperamide (IMODIUM A-D) 2 MG tablet Take 1-2 mg by mouth 4 (four) times daily as needed for diarrhea or loose stools.      Marland Kitchen LORazepam (ATIVAN) 0.5 MG tablet Take 1 tablet (0.5 mg total)  by mouth daily as needed for anxiety.  30 tablet  0  . meclizine (ANTIVERT) 12.5 MG tablet Take 1 tablet (12.5 mg total) by mouth 3 (three) times daily as needed for dizziness.  60 tablet  2  . omeprazole (PRILOSEC) 20 MG capsule Take 1 capsule (20 mg total) by mouth daily.  90 capsule  3  . pravastatin (PRAVACHOL) 40 MG tablet Take 1 tablet (40 mg total) by mouth daily.  90 tablet  3  . Psyllium (METAMUCIL) 28.3 % POWD Take 5 mLs by mouth daily.       . simethicone (GAS-X) 80 MG chewable tablet Chew 80 mg by mouth 3 (three) times daily after meals.      . tolterodine (DETROL LA) 4 MG 24 hr capsule Take 1 capsule (4 mg total) by mouth daily.  90 capsule  3  . trolamine salicylate (ASPERCREME) 10 % cream Apply 1 application topically 2 (two) times daily as needed (pain).      . valsartan-hydrochlorothiazide (DIOVAN HCT) 160-12.5 MG per tablet Take 1 tablet by mouth  daily.  45 tablet  3  . glucose blood (ONE TOUCH ULTRA TEST) test strip Use as instructed  100 each  12   No current facility-administered medications on file prior to visit.   Review of Systems  Constitutional: Negative for unexpected weight change, or unusual diaphoresis  HENT: Negative for tinnitus.   Eyes: Negative for photophobia and visual disturbance.  Respiratory: Negative for choking and stridor.   Gastrointestinal: Negative for vomiting and blood in stool.  Genitourinary: Negative for hematuria and decreased urine volume.  Musculoskeletal: Negative for acute joint swelling or back pain Skin: Negative for color change and wound.  Neurological: Negative for tremors and numbness other than noted  Psychiatric/Behavioral: Negative for decreased concentration or  hyperactivity.       Objective:   Physical Exam BP 154/68  Pulse 85  Temp(Src) 98.2 F (36.8 C) (Oral)  Wt 205 lb 2 oz (93.044 kg)  SpO2 97% VS noted, mild ill Constitutional: Pt appears well-developed and well-nourished.  HENT: Head: NCAT.  Right Ear: External ear normal.  Left Ear: External ear normal.  Eyes: Conjunctivae and EOM are normal. Pupils are equal, round, and reactive to light.  Bilat tm's without erythema.  Max sinus areas non tender.  Pharynx with mild erythema, no exudate Neck: Normal range of motion. Neck supple.  Cardiovascular: Normal rate and regular rhythm.   Pulmonary/Chest: Effort normal and breath sounds normal.  Abd:  Soft, NT, non-distended, + BS Neurological: Pt is alert. Not confused , motor 5/5, gait intact but seems fatigued Skin: Skin is warm. No erythema.  Psychiatric: Pt behavior is normal. Thought content normal.     Assessment & Plan:

## 2012-12-17 NOTE — Assessment & Plan Note (Signed)
?   Clinical significance,  - for urine studies, without other overt source of infection will tx empirically for GU tract infectino with rocephin IM, then levaquin asd, diflucan asd,  to f/u any worsening symptoms or concerns

## 2012-12-20 LAB — URINE CULTURE: Colony Count: >=100000

## 2012-12-23 ENCOUNTER — Encounter: Payer: Self-pay | Admitting: Internal Medicine

## 2012-12-29 ENCOUNTER — Telehealth: Payer: Self-pay | Admitting: *Deleted

## 2012-12-29 NOTE — Telephone Encounter (Signed)
Pt called states she woke up this morning in a panic attack.  States she was having tachycardia, she did take a Lorazepam which calmed her symptoms down.  Pt declined appoint for 12.18.14.  Please advise

## 2012-12-29 NOTE — Telephone Encounter (Signed)
Pt has med for tx, can make appt at her convenience if feels needs further tx ; I can refer for counseling as well

## 2012-12-29 NOTE — Telephone Encounter (Signed)
Patient informed of MD instructions.  She is doing better now and did take a lorazepam.  She is going to call her therapist.

## 2012-12-29 NOTE — Telephone Encounter (Signed)
Called left message to call back 

## 2012-12-29 NOTE — Telephone Encounter (Signed)
The patient called back this afternoon.  She tried to get an appt. But unable to until next week.  She is concerned that the episode she had last night  Could be either a reaction or panic attack.  States her heart was pounding.  She does not think she should wait until next week to be seen

## 2012-12-29 NOTE — Telephone Encounter (Signed)
Same answer - for OV at her convenience, could be this wk if have open appt's, could also consider other MD in office as well

## 2012-12-29 NOTE — Telephone Encounter (Signed)
Patient informed. 

## 2013-01-11 ENCOUNTER — Ambulatory Visit: Payer: 59 | Admitting: Psychiatry

## 2013-01-18 ENCOUNTER — Ambulatory Visit (INDEPENDENT_AMBULATORY_CARE_PROVIDER_SITE_OTHER): Payer: 59 | Admitting: Psychiatry

## 2013-01-18 DIAGNOSIS — F0630 Mood disorder due to known physiological condition, unspecified: Secondary | ICD-10-CM

## 2013-01-25 ENCOUNTER — Ambulatory Visit: Payer: 59 | Admitting: Psychiatry

## 2013-01-25 ENCOUNTER — Ambulatory Visit: Payer: Federal, State, Local not specified - Other | Admitting: Psychiatry

## 2013-01-27 ENCOUNTER — Ambulatory Visit (INDEPENDENT_AMBULATORY_CARE_PROVIDER_SITE_OTHER): Payer: 59 | Admitting: Psychiatry

## 2013-01-27 ENCOUNTER — Telehealth: Payer: Self-pay | Admitting: *Deleted

## 2013-01-27 DIAGNOSIS — F0630 Mood disorder due to known physiological condition, unspecified: Secondary | ICD-10-CM

## 2013-01-27 NOTE — Telephone Encounter (Signed)
Enis Gash with [employer] phoned to confirm MD receipt of a medication review form that was faxed 12/23/12.  They are faxing another one, just in case the first one was not received.  They are needing this form to either be accepted or denied by MD and returned ASAP.    CB# [phone]  FAX [phone]  Please advise

## 2013-02-09 ENCOUNTER — Ambulatory Visit: Payer: Federal, State, Local not specified - Other | Admitting: Psychiatry

## 2013-02-14 ENCOUNTER — Telehealth: Payer: Self-pay | Admitting: *Deleted

## 2013-02-14 NOTE — Telephone Encounter (Signed)
Patient phoned stating that over the past couple of weeks, she had been having blurred vision, which has increasingly worsened, along with dizziness.  She stated that no one had been able to work her in to see her in an office visit.  Considering her symptoms & history, I recommended patient either go to her nearest urgent care or emergency room.  She denied those being options & requesting scheduling.

## 2013-02-15 ENCOUNTER — Ambulatory Visit (INDEPENDENT_AMBULATORY_CARE_PROVIDER_SITE_OTHER): Payer: Federal, State, Local not specified - Other | Admitting: Psychiatry

## 2013-02-15 ENCOUNTER — Ambulatory Visit (INDEPENDENT_AMBULATORY_CARE_PROVIDER_SITE_OTHER): Payer: Medicare Other | Admitting: Internal Medicine

## 2013-02-15 ENCOUNTER — Encounter: Payer: Self-pay | Admitting: Internal Medicine

## 2013-02-15 VITALS — BP 152/78 | HR 80 | Temp 98.3°F | Wt 207.1 lb

## 2013-02-15 DIAGNOSIS — I10 Essential (primary) hypertension: Secondary | ICD-10-CM

## 2013-02-15 DIAGNOSIS — H538 Other visual disturbances: Secondary | ICD-10-CM

## 2013-02-15 DIAGNOSIS — F411 Generalized anxiety disorder: Secondary | ICD-10-CM

## 2013-02-15 DIAGNOSIS — F0630 Mood disorder due to known physiological condition, unspecified: Secondary | ICD-10-CM

## 2013-02-15 DIAGNOSIS — Z23 Encounter for immunization: Secondary | ICD-10-CM

## 2013-02-15 DIAGNOSIS — R350 Frequency of micturition: Secondary | ICD-10-CM

## 2013-02-15 DIAGNOSIS — E119 Type 2 diabetes mellitus without complications: Secondary | ICD-10-CM

## 2013-02-15 DIAGNOSIS — E785 Hyperlipidemia, unspecified: Secondary | ICD-10-CM

## 2013-02-15 DIAGNOSIS — H5710 Ocular pain, unspecified eye: Secondary | ICD-10-CM

## 2013-02-15 MED ORDER — ATORVASTATIN CALCIUM 10 MG PO TABS: ORAL_TABLET | ORAL | Status: DC

## 2013-02-15 MED ORDER — VALSARTAN 320 MG PO TABS: 320.0000 mg | ORAL_TABLET | Freq: Every day | ORAL | Status: DC

## 2013-02-15 NOTE — Assessment & Plan Note (Signed)
prob OAB, pt stopped detrol LA as seemed to work too well

## 2013-02-15 NOTE — Assessment & Plan Note (Signed)
Ok for low dose liptior qod trial, pt wants to try again

## 2013-02-15 NOTE — Assessment & Plan Note (Addendum)
?   DM related vs other, even glaucoma ?- for optho referral  Note:  Total time for pt hx, exam, review of record with pt in the room, determination of diagnoses and plan for further eval and tx is > 40 min, with over 50% spent in coordination and counseling of patient

## 2013-02-15 NOTE — Assessment & Plan Note (Signed)
For optho referal as above

## 2013-02-15 NOTE — Assessment & Plan Note (Signed)
Declines citalopram though is a very signficant problem, to cont counseling as she is doing

## 2013-02-15 NOTE — Patient Instructions (Addendum)
OK to stop the detrol LA as you have OK to stop the Diovan HCT which has the fluid pill part of it that could be related to the dizziness Please take all new medication as prescribed - the Diovan 320 mg per day, Lipitor 10 mg every other day,  Please continue all other medications as before Please have the pharmacy call with any other refills you may need.  Please go to the LAB in the Basement (turn left off the elevator) for the tests to be done tomorrow or as you can You will be contacted by phone if any changes need to be made immediately.  Otherwise, you will receive a letter about your results with an explanation, but please check with MyChart first.  Please continue the counseling as you have arranged  Please call if you change your mind about the daily medication for nerves and depression (citalopram 10 mg)  You will be contacted regarding the referral for: Diabetes class, and opthamology  Please return in 3 months, or sooner if needed

## 2013-02-15 NOTE — Assessment & Plan Note (Signed)
?   Mild worse control, for lab f/u, also DM education

## 2013-02-15 NOTE — Progress Notes (Signed)
Pre-visit discussion using our clinic review tool. No additional management support is needed unless otherwise documented below in the visit note.  

## 2013-02-15 NOTE — Progress Notes (Signed)
Subjective:    Patient ID: Laura [name], female    DOB: 02/25/1945, 68 y.o.   MRN: [id number]  HPI   Here with recent worsening blurred vision, dizziness, and intermittent eye aching not sure is related, aching type, mild, has not seen optho recently, no vision changes.  CBG's averaging 160, higher than the usually better ave of 130-140. Gained 3 lbs with trying to change her diet for the better with less carbs.    Also recent bilat morning numbness to distal arms and hands, improved within a few minutes over the past few wks off and on.  Asks for statin to take qod since could not take daily, such as pravastatin but she is convinced it makes her sugar worse so wont take that one, wants to try lipitor again.  Denies worsening depressive symptoms, suicidal ideation, or panic; has ongoing anxiety, some increased recently, with a near panic last nigth for no clear reason. Chronic recurrent dizziness no change Past Medical History  Diagnosis Date  . Diabetes mellitus   . Hyperlipidemia   . Hypertension   . Anxiety   . Reflux   . Vertigo   . Arthritis     back of neck, bones spurs on neck  . Sleep apnea     wears CPAP occasionally  . Cervical disc disease    Past Surgical History  Procedure Laterality Date  . Bladder surgery    . Cholecystectomy    . Partial hysterectomy    . Fibroids removed      breast (both breasts)  . Breast surgery      reports that she has never smoked. She has never used smokeless tobacco. She reports that she does not drink alcohol or use illicit drugs. family history includes Diabetes in her father, maternal aunt, and sister; Hypertension in her other. There is no history of Colon cancer, Esophageal cancer, Stomach cancer, or Rectal cancer. Allergies  Allergen Reactions  . Codeine     itch  . Crestor [Rosuvastatin Calcium] Other (See Comments)    Did something to memory   . Lipitor [Atorvastatin Calcium] Other (See Comments)    Makes weak   .  Prednisone     Nervous    . Sulfa Antibiotics     Tongue swells, hives, itching   Current Outpatient Prescriptions on File Prior to Visit  Medication Sig Dispense Refill  . acetaminophen (TYLENOL) 325 MG tablet Take 650 mg by mouth every 6 (six) hours as needed for pain.      . cholecalciferol (VITAMIN D) 1000 UNITS tablet Take 1,000 Units by mouth daily.        . clotrimazole-betamethasone (LOTRISONE) cream Use as directed twice per day  15 g  1  . cyclobenzaprine (FLEXERIL) 10 MG tablet Take 1 tablet (10 mg total) by mouth 3 (three) times daily as needed for muscle spasms.  30 tablet  0  . fluticasone (FLONASE) 50 MCG/ACT nasal spray Place 2 sprays into the nose daily.  16 g  5  . glimepiride (AMARYL) 1 MG tablet Take 0.5 tablets (0.5 mg total) by mouth 2 (two) times daily.  90 tablet  3  . Lancets MISC 1 application by Does not apply route daily.  100 each  11  . Lancets MISC 1 application by Does not apply route daily.  100 each  11  . levofloxacin (LEVAQUIN) 250 MG tablet Take 1 tablet (250 mg total) by mouth daily.  10 tablet  0  .  loperamide (IMODIUM A-D) 2 MG tablet Take 1-2 mg by mouth 4 (four) times daily as needed for diarrhea or loose stools.      Marland Kitchen LORazepam (ATIVAN) 0.5 MG tablet Take 1 tablet (0.5 mg total) by mouth daily as needed for anxiety.  30 tablet  0  . meclizine (ANTIVERT) 12.5 MG tablet Take 1 tablet (12.5 mg total) by mouth 3 (three) times daily as needed for dizziness.  60 tablet  2  . omeprazole (PRILOSEC) 20 MG capsule Take 1 capsule (20 mg total) by mouth daily.  90 capsule  3  . Psyllium (METAMUCIL) 28.3 % POWD Take 5 mLs by mouth daily.       . simethicone (GAS-X) 80 MG chewable tablet Chew 80 mg by mouth 3 (three) times daily after meals.      . trolamine salicylate (ASPERCREME) 10 % cream Apply 1 application topically 2 (two) times daily as needed (pain).      Marland Kitchen glucose blood (ONE TOUCH ULTRA TEST) test strip Use as instructed  100 each  12   No current  facility-administered medications on file prior to visit.        Review of Systems All otherwise neg per pt     Objective:   Physical Exam BP 152/78  Pulse 80  Temp(Src) 98.3 F (36.8 C) (Oral)  Wt 207 lb 2 oz (93.951 kg)  SpO2 98% VS noted,  Constitutional: Pt appears well-developed and well-nourished./obese  HENT: Head: NCAT.  Right Ear: External ear normal.  Left Ear: External ear normal.  Eyes: Conjunctivae and EOM are normal. Pupils are equal, round, and reactive to light.  Neck: Normal range of motion. Neck supple.  Cardiovascular: Normal rate and regular rhythm.   Pulmonary/Chest: Effort normal and breath sounds normal.  Abd:  Soft, NT, non-distended, + BS Neurological: Pt is alert. Not confused  Skin: Skin is warm. No erythema.  Psychiatric: Pt behavior is normal. Thought content normal.     Assessment & Plan:

## 2013-02-15 NOTE — Assessment & Plan Note (Signed)
C/o dizziness, will try change diovan HCT to diovan 300 to avoid diuretic for now BP Readings from Last 3 Encounters:  02/15/13 152/78  12/17/12 154/68  10/07/12 142/78

## 2013-02-17 ENCOUNTER — Ambulatory Visit (INDEPENDENT_AMBULATORY_CARE_PROVIDER_SITE_OTHER): Payer: 59

## 2013-02-17 DIAGNOSIS — E785 Hyperlipidemia, unspecified: Secondary | ICD-10-CM

## 2013-02-17 DIAGNOSIS — E119 Type 2 diabetes mellitus without complications: Secondary | ICD-10-CM

## 2013-02-17 LAB — LDL CHOLESTEROL, DIRECT: LDL DIRECT: 168.3 mg/dL

## 2013-02-17 LAB — CBC WITH DIFFERENTIAL/PLATELET
BASOS PCT: 0 % (ref 0.0–3.0)
Basophils Absolute: 0 10*3/uL (ref 0.0–0.1)
EOS ABS: 0.1 10*3/uL (ref 0.0–0.7)
EOS PCT: 1 % (ref 0.0–5.0)
HCT: 40.3 % (ref 36.0–46.0)
HEMOGLOBIN: 13.4 g/dL (ref 12.0–15.0)
LYMPHS PCT: 28.5 % (ref 12.0–46.0)
Lymphs Abs: 2.6 10*3/uL (ref 0.7–4.0)
MCHC: 33.4 g/dL (ref 30.0–36.0)
MCV: 87.4 fl (ref 78.0–100.0)
MONOS PCT: 5.2 % (ref 3.0–12.0)
Monocytes Absolute: 0.5 10*3/uL (ref 0.1–1.0)
NEUTROS ABS: 5.9 10*3/uL (ref 1.4–7.7)
NEUTROS PCT: 65.3 % (ref 43.0–77.0)
Platelets: 271 10*3/uL (ref 150.0–400.0)
RBC: 4.61 Mil/uL (ref 3.87–5.11)
RDW: 13.4 % (ref 11.5–14.6)
WBC: 9.1 10*3/uL (ref 4.5–10.5)

## 2013-02-17 LAB — LIPID PANEL
Cholesterol: 232 mg/dL — ABNORMAL HIGH (ref 0–200)
HDL: 51.4 mg/dL (ref >39.00)
Total CHOL/HDL Ratio: 5
Triglycerides: 126 mg/dL (ref 0.0–149.0)
VLDL: 25.2 mg/dL (ref 0.0–40.0)

## 2013-02-17 LAB — HEPATIC FUNCTION PANEL
ALK PHOS: 68 U/L (ref 39–117)
ALT: 21 U/L (ref 0–35)
AST: 20 U/L (ref 0–37)
Albumin: 4.3 g/dL (ref 3.5–5.2)
BILIRUBIN TOTAL: 0.6 mg/dL (ref 0.3–1.2)
Bilirubin, Direct: 0 mg/dL (ref 0.0–0.3)
Total Protein: 7.8 g/dL (ref 6.0–8.3)

## 2013-02-17 LAB — TSH: TSH: 1.87 u[IU]/mL (ref 0.35–5.50)

## 2013-02-17 LAB — BASIC METABOLIC PANEL
BUN: 16 mg/dL (ref 6–23)
CHLORIDE: 105 meq/L (ref 96–112)
CO2: 26 mEq/L (ref 19–32)
Calcium: 10.2 mg/dL (ref 8.4–10.5)
Creatinine, Ser: 0.8 mg/dL (ref 0.4–1.2)
GFR: 98.75 mL/min (ref >60.00)
Glucose, Bld: 119 mg/dL — ABNORMAL HIGH (ref 70–99)
Potassium: 4.2 mEq/L (ref 3.5–5.1)
SODIUM: 138 meq/L (ref 135–145)

## 2013-02-17 LAB — HEMOGLOBIN A1C: HEMOGLOBIN A1C: 7 % — AB (ref 4.6–6.5)

## 2013-02-17 LAB — SEDIMENTATION RATE: SED RATE: 21 mm/h (ref 0–22)

## 2013-02-17 LAB — VITAMIN B12: Vitamin B-12: 812 pg/mL (ref 211–911)

## 2013-02-18 ENCOUNTER — Other Ambulatory Visit: Payer: Medicare Other

## 2013-02-18 LAB — URINALYSIS, ROUTINE W REFLEX MICROSCOPIC
Bilirubin Urine: NEGATIVE
HGB URINE DIPSTICK: NEGATIVE
Ketones, ur: NEGATIVE
Leukocytes, UA: NEGATIVE
NITRITE: NEGATIVE
PH: 5.5 (ref 5.0–8.0)
SPECIFIC GRAVITY, URINE: 1.02 (ref 1.000–1.030)
TOTAL PROTEIN, URINE-UPE24: NEGATIVE
Urine Glucose: NEGATIVE
Urobilinogen, UA: 0.2 (ref 0.0–1.0)

## 2013-03-01 ENCOUNTER — Ambulatory Visit: Payer: Federal, State, Local not specified - Other | Admitting: Psychiatry

## 2013-03-21 ENCOUNTER — Encounter: Payer: Self-pay | Admitting: Internal Medicine

## 2013-03-21 ENCOUNTER — Ambulatory Visit (INDEPENDENT_AMBULATORY_CARE_PROVIDER_SITE_OTHER)
Admission: RE | Admit: 2013-03-21 | Discharge: 2013-03-21 | Disposition: A | Discharge status: Home / Self Care | Payer: 59 | Source: Ambulatory Visit | Attending: Internal Medicine | Admitting: Internal Medicine

## 2013-03-21 ENCOUNTER — Ambulatory Visit (INDEPENDENT_AMBULATORY_CARE_PROVIDER_SITE_OTHER): Payer: 59 | Admitting: Internal Medicine

## 2013-03-21 ENCOUNTER — Telehealth: Payer: Self-pay | Admitting: *Deleted

## 2013-03-21 ENCOUNTER — Other Ambulatory Visit (INDEPENDENT_AMBULATORY_CARE_PROVIDER_SITE_OTHER): Payer: 59

## 2013-03-21 VITALS — BP 150/80 | HR 83 | Temp 97.9°F | Wt 206.0 lb

## 2013-03-21 DIAGNOSIS — M5010 Cervical disc disorder with radiculopathy, unspecified cervical region: Secondary | ICD-10-CM

## 2013-03-21 DIAGNOSIS — M25511 Pain in right shoulder: Secondary | ICD-10-CM

## 2013-03-21 DIAGNOSIS — M5412 Radiculopathy, cervical region: Secondary | ICD-10-CM

## 2013-03-21 DIAGNOSIS — M25512 Pain in left shoulder: Secondary | ICD-10-CM

## 2013-03-21 DIAGNOSIS — M25519 Pain in unspecified shoulder: Secondary | ICD-10-CM

## 2013-03-21 LAB — SEDIMENTATION RATE: Sed Rate: 16 mm/hr (ref 0–22)

## 2013-03-21 MED ORDER — GABAPENTIN 100 MG PO CAPS: 100.0000 mg | ORAL_CAPSULE | Freq: Three times a day (TID) | ORAL | Status: DC

## 2013-03-21 NOTE — Patient Instructions (Signed)
Your next office appointment will be determined based upon review of your pending  x-rays. Those instructions will be transmitted to you through My Chart . Use a cervical memory foam pillow to prevent hyperextension or hyperflexion of the cervical spine. Followup as needed for your acute issue. Please report any significant change in your symptoms.

## 2013-03-21 NOTE — Progress Notes (Signed)
   Subjective:    Patient ID: Laura [name], female    DOB: June 17, 1945, 68 y.o.   MRN: [id number]  HPI   She describes pain in the cervical spine with radiation into both upper extremities for the last month. No injury or trauma. There is also some tingling in the left upper extremity associated.  It has progressed and is now up to 7-8 in severity. It is disturbing sleep as it is worse at night. Ice and nonsteroidals have been of some benefit   She's also had associated discomfort in the shoulder joints themselves.  She was evaluated 2 years ago by Dr. an orthopedic spine specialist who prescribed physical therapy. Also on record his evaluation for cervical disc disease at [facility]      Review of Systems  She has no stool/urine incontinence.  There's been no associated rash, color or temperature change in the skin in the area pain. She has had no fever, chills, sweats. She's actually gained weight.  She has been diagnosed as having vertigo.Past history also includes panic attacks since the vertigo started.  Symptoms of fatigue and weakness are chronic in nature  She is constipated.       Objective:   Physical Exam  She appears well-nourished in no acute distress  She has no lymphadenopathy about the neck or axilla.  There is decreased lateral range of motion of the neck particularly to the left.  Strength and tone are normal opposition in the upper extremities.  Deep tendon reflexes are normal and equal.  Crepitus is noted the shoulders with range of motion. This does cause some discomfort.  There is no malalignment suggested of the neck or spine.          Assessment & Plan:  #1 cervical radiculopathy, bilateral  #2 shoulder pain; R/O PMR  Plan: See orders

## 2013-03-21 NOTE — Telephone Encounter (Signed)
Patient phoned regarding newly prescribed gabapentin-she read the med info printout from the pharmacy-states she already suffers from 7 of the listed possible side effects r/t her chronic vertigo and is scared to take the medication.  Please advise if alternate med or if med okay to take for patient.  CB# [phone]

## 2013-03-21 NOTE — Telephone Encounter (Signed)
Phoned and notified patient of MD response & recommendations.

## 2013-03-21 NOTE — Telephone Encounter (Signed)
   This would be the drug of choice for the symptoms of cervical radiculopathy. If she does not feel comfortable taking this; she should wait till we get the results of her lab tests and x-rays.

## 2013-03-21 NOTE — Progress Notes (Signed)
Pre visit review using our clinic review tool, if applicable. No additional management support is needed unless otherwise documented below in the visit note. 

## 2013-03-22 ENCOUNTER — Other Ambulatory Visit: Payer: Self-pay | Admitting: Internal Medicine

## 2013-03-22 ENCOUNTER — Encounter: Payer: Self-pay | Admitting: Internal Medicine

## 2013-03-22 DIAGNOSIS — M47812 Spondylosis without myelopathy or radiculopathy, cervical region: Secondary | ICD-10-CM

## 2013-03-22 MED ORDER — MELOXICAM 15 MG PO TABS: ORAL_TABLET | ORAL | Status: DC

## 2013-03-22 NOTE — Telephone Encounter (Signed)
Laura [name] to see above 

## 2013-03-29 ENCOUNTER — Encounter: Payer: Self-pay | Admitting: Internal Medicine

## 2013-03-29 ENCOUNTER — Ambulatory Visit (INDEPENDENT_AMBULATORY_CARE_PROVIDER_SITE_OTHER): Payer: Medicare Other | Admitting: Internal Medicine

## 2013-03-29 VITALS — BP 160/90 | HR 91 | Temp 98.9°F | Ht 64.0 in | Wt 206.5 lb

## 2013-03-29 DIAGNOSIS — M549 Dorsalgia, unspecified: Secondary | ICD-10-CM

## 2013-03-29 DIAGNOSIS — R350 Frequency of micturition: Secondary | ICD-10-CM

## 2013-03-29 DIAGNOSIS — N318 Other neuromuscular dysfunction of bladder: Secondary | ICD-10-CM

## 2013-03-29 DIAGNOSIS — I10 Essential (primary) hypertension: Secondary | ICD-10-CM

## 2013-03-29 DIAGNOSIS — J329 Chronic sinusitis, unspecified: Secondary | ICD-10-CM

## 2013-03-29 LAB — POCT URINALYSIS DIPSTICK
Bilirubin, UA: NEGATIVE
Blood, UA: NEGATIVE
GLUCOSE UA: NEGATIVE
Ketones, UA: NEGATIVE
Leukocytes, UA: NEGATIVE
Nitrite, UA: NEGATIVE
Protein, UA: NEGATIVE
SPEC GRAV UA: 1.015
Urobilinogen, UA: NEGATIVE
pH, UA: 5

## 2013-03-29 MED ORDER — TOLTERODINE TARTRATE ER 2 MG PO CP24: 2.0000 mg | ORAL_CAPSULE | Freq: Every day | ORAL | Status: DC

## 2013-03-29 MED ORDER — AMLODIPINE BESYLATE 5 MG PO TABS: 5.0000 mg | ORAL_TABLET | Freq: Every day | ORAL | Status: DC

## 2013-03-29 MED ORDER — CEPHALEXIN 500 MG PO CAPS: 500.0000 mg | ORAL_CAPSULE | Freq: Four times a day (QID) | ORAL | Status: DC

## 2013-03-29 MED ORDER — TRAMADOL HCL 50 MG PO TABS: 50.0000 mg | ORAL_TABLET | Freq: Every evening | ORAL | Status: DC | PRN

## 2013-03-29 MED ORDER — HYDROCODONE-HOMATROPINE 5-1.5 MG/5ML PO SYRP: 5.0000 mL | ORAL_SOLUTION | Freq: Four times a day (QID) | ORAL | Status: DC | PRN

## 2013-03-29 NOTE — Assessment & Plan Note (Signed)
With acute flare, Mild to mod, for antibx course,  to f/u any worsening symptoms or concerns

## 2013-03-29 NOTE — Progress Notes (Signed)
Subjective:    Patient ID: Laura [name], female    DOB: 11-10-1945, 68 y.o.   MRN: [id number]    HPI   Here with 2-3 days acute onset fever, facial pain, pressure, headache, general weakness and malaise, and greenish d/c, with mild ST and cough, but pt denies chest pain, wheezing, increased sob or doe, orthopnea, PND, increased LE swelling, palpitations, dizziness or syncope. Also with urinary freq, drinking plenty of fluids, not taking detrol LA as before, has recurrent neck pain, for PT next wk per Dr Louanne Skye, but mobic not helping, cant get to sleep, may eventually need surgury. Past Medical History  Diagnosis Date  . Diabetes mellitus   . Hyperlipidemia   . Hypertension   . Anxiety   . Reflux   . Vertigo   . Arthritis     back of neck, bones spurs on neck  . Sleep apnea     wears CPAP occasionally  . Cervical disc disease    Past Surgical History  Procedure Laterality Date  . Bladder surgery    . Cholecystectomy    . Partial hysterectomy    . Fibroids removed      breast (both breasts)  . Breast surgery      reports that she has never smoked. She has never used smokeless tobacco. She reports that she does not drink alcohol or use illicit drugs. family history includes Diabetes in her father, maternal aunt, and sister; Hypertension in her other. There is no history of Colon cancer, Esophageal cancer, Stomach cancer, or Rectal cancer. Allergies  Allergen Reactions  . Sulfa Antibiotics     Tongue swells, hives, itching  . Codeine     itch  . Crestor [Rosuvastatin Calcium] Other (See Comments)    Did something to memory   . Lipitor [Atorvastatin Calcium] Other (See Comments)    Makes weak   . Prednisone     Nervous     Current Outpatient Prescriptions on File Prior to Visit  Medication Sig Dispense Refill  . acetaminophen (TYLENOL) 325 MG tablet Take 650 mg by mouth every 6 (six) hours as needed for pain.      Marland Kitchen atorvastatin (LIPITOR) 10 MG tablet 1 tab by mouth  every other day  45 tablet  3  . cholecalciferol (VITAMIN D) 1000 UNITS tablet Take 1,000 Units by mouth daily.        . cyclobenzaprine (FLEXERIL) 10 MG tablet Take 1 tablet (10 mg total) by mouth 3 (three) times daily as needed for muscle spasms.  30 tablet  0  . glimepiride (AMARYL) 1 MG tablet Take 0.5 tablets (0.5 mg total) by mouth 2 (two) times daily.  90 tablet  3  . Lancets MISC 1 application by Does not apply route daily.  100 each  11  . loperamide (IMODIUM A-D) 2 MG tablet Take 1-2 mg by mouth 4 (four) times daily as needed for diarrhea or loose stools.      Marland Kitchen LORazepam (ATIVAN) 0.5 MG tablet Take 1 tablet (0.5 mg total) by mouth daily as needed for anxiety.  30 tablet  0  . meclizine (ANTIVERT) 12.5 MG tablet Take 1 tablet (12.5 mg total) by mouth 3 (three) times daily as needed for dizziness.  60 tablet  2  . meloxicam (MOBIC) 15 MG tablet 1/2 every 12 hrs prn neck pain  14 tablet  0  . omeprazole (PRILOSEC) 20 MG capsule Take 1 capsule (20 mg total) by mouth daily.  [place]  capsule  3  . Psyllium (METAMUCIL) 28.3 % POWD Take 5 mLs by mouth daily.       . simethicone (GAS-X) 80 MG chewable tablet Chew 80 mg by mouth 3 (three) times daily after meals.      . valsartan (DIOVAN) 320 MG tablet Take 1 tablet (320 mg total) by mouth daily.  90 tablet  3  . glucose blood (ONE TOUCH ULTRA TEST) test strip Use as instructed  100 each  12   No current facility-administered medications on file prior to visit.   Review of Systems  Constitutional: Negative for unexpected weight change, or unusual diaphoresis  HENT: Negative for tinnitus.   Eyes: Negative for photophobia and visual disturbance.  Respiratory: Negative for choking and stridor.   Gastrointestinal: Negative for vomiting and blood in stool.  Genitourinary: Negative for hematuria and decreased urine volume.  Musculoskeletal: Negative for acute joint swelling Skin: Negative for color change and wound.  Neurological: Negative for  tremors and numbness other than noted  Psychiatric/Behavioral: Negative for decreased concentration or  hyperactivity.       Objective:   Physical Exam BP 160/90  Pulse 91  Temp(Src) 98.9 F (37.2 C) (Oral)  Ht 5\' 4"  (1.626 m)  Wt 206 lb 8 oz (93.668 kg)  BMI 35.43 kg/m2  SpO2 95% VS noted, mild ill Constitutional: Pt appears well-developed and well-nourished.  HENT: Head: NCAT.  Right Ear: External ear normal.  Left Ear: External ear normal.  Bilat tm's with mild erythema.  Max sinus areas mild tender.  Pharynx with mild erythema, no exudate Eyes: Conjunctivae and EOM are normal. Pupils are equal, round, and reactive to light.  Neck: Normal range of motion. Neck supple.  Cardiovascular: Normal rate and regular rhythm.   Pulmonary/Chest: Effort normal and breath sounds normal.  Spine nontender Neurological: Pt is alert. Not confused  Skin: Skin is warm. No erythema.  Psychiatric: Pt behavior is normal. Thought content normal.     Assessment & Plan:

## 2013-03-29 NOTE — Assessment & Plan Note (Signed)
For detrol LA 2 mg re-start,  to f/u any worsening symptoms or concerns

## 2013-03-29 NOTE — Progress Notes (Signed)
Pre visit review using our clinic review tool, if applicable. No additional management support is needed unless otherwise documented below in the visit note. 

## 2013-03-29 NOTE — Assessment & Plan Note (Signed)
Uncontrolled, add amlod 5 qd

## 2013-03-29 NOTE — Assessment & Plan Note (Signed)
For tramadol qhs prn,  to f/u any worsening symptoms or concerns, cont PT, f/u surgury as planned

## 2013-03-29 NOTE — Patient Instructions (Addendum)
Please take all new medication as prescribed- the antibiotic, cough medicine, the pain medication for sleeping at night, as well as the detrol LA 2 mg per day for bladder, and the amlodipine 5 mg per day for blood pressure  Please continue all other medications as before, and refills have been done if requested. Please have the pharmacy call with any other refills you may need.  Please continue your efforts at being more active, low cholesterol diet, and weight control.  Please return in 3 months, or sooner if needed (the office will call)

## 2013-04-18 ENCOUNTER — Other Ambulatory Visit: Payer: Self-pay | Admitting: Internal Medicine

## 2013-04-18 DIAGNOSIS — M47812 Spondylosis without myelopathy or radiculopathy, cervical region: Secondary | ICD-10-CM

## 2013-04-18 NOTE — Telephone Encounter (Signed)
OK #10   1/2 qd prn only; NOT a maintenance medication

## 2013-04-18 NOTE — Telephone Encounter (Signed)
Requesting Lorazepam 0.5mg -Take 1/2 tablet every 12 hours as needed for neck pain. Last refill:08-05-12-#30,0 Last OV:03-29-13 Please advise.//AB/CMA

## 2013-04-18 NOTE — Telephone Encounter (Signed)
Requesting Mobic 15mg -Take 1/2 tablet by mouth every 12 hours as needed for neck pain. Last refill:#14,0 Last OV:03-21-13 Please advise.//AB/CMA

## 2013-04-18 NOTE — Telephone Encounter (Signed)
Rx sent to the pharmacy by e-script.//AB/CMA 

## 2013-04-19 ENCOUNTER — Telehealth: Payer: Self-pay | Admitting: Internal Medicine

## 2013-04-19 MED ORDER — MELOXICAM 15 MG PO TABS: 15.0000 mg | ORAL_TABLET | Freq: Every day | ORAL | Status: DC | PRN

## 2013-04-19 MED ORDER — LORAZEPAM 0.5 MG PO TABS: 0.5000 mg | ORAL_TABLET | Freq: Every day | ORAL | Status: DC | PRN

## 2013-04-19 NOTE — Telephone Encounter (Signed)
Faxed

## 2013-04-19 NOTE — Telephone Encounter (Signed)
[place] for hycodan syrup for her allergy list  Ok for [employer] prn  - done erx  Ok for ativan prn - Done hardcopy to robin  For message:  ----- Message ----- From: Webb Silversmith, NP Sent: 04/18/2013 10:54 AM To: Donell Sievert Ewing Subject: FW: Medication Renewal Request See below, Dr. Jenny Reichmann pt ----- Message ----- From: Lanelle Bal, CMA Sent: 04/18/2013 9:48 AM To: Webb Silversmith, NP Subject: FW: Medication Renewal Request ----- Message ----- From: [name] Sent: 04/18/2013 9:45 AM To: [facility] Subject: Medication Renewal Request Original authorizing provider: Webb Silversmith, NP [name] would like a refill of the following medications: LORazepam (ATIVAN) 0.5 MG tablet [BAITY, REGINA, NP] Preferred pharmacy: [place], [place] [address] Comment: Request refill for meloxicam and lorazepam to be sent to [facility] for pick up, ph# [phone]; also request to have noted on mychart, to never prescribe Hydrocodone homatropine cough syrup, ever, for me, because of the severe, adverse, complications I received as a result of taking this medication. Thanks, Laura [name] [phone] Medication renewals requested in this message routed to other providers: meloxicam (MOBIC) 15 MG tablet Unice Cobble, MD]

## 2013-04-19 NOTE — Telephone Encounter (Signed)
Faxed hardcopy for Lorazepam 0.5 mg #30 with 2 RF's to [employer].

## 2013-05-03 ENCOUNTER — Encounter: Payer: Self-pay | Admitting: Internal Medicine

## 2013-05-03 ENCOUNTER — Ambulatory Visit (INDEPENDENT_AMBULATORY_CARE_PROVIDER_SITE_OTHER): Payer: Medicare Other | Admitting: Internal Medicine

## 2013-05-03 VITALS — BP 150/78 | HR 81 | Temp 98.1°F | Wt 205.0 lb

## 2013-05-03 DIAGNOSIS — N318 Other neuromuscular dysfunction of bladder: Secondary | ICD-10-CM

## 2013-05-03 DIAGNOSIS — E119 Type 2 diabetes mellitus without complications: Secondary | ICD-10-CM

## 2013-05-03 DIAGNOSIS — M5090 Cervical disc disorder, unspecified, unspecified cervical region: Secondary | ICD-10-CM

## 2013-05-03 DIAGNOSIS — I10 Essential (primary) hypertension: Secondary | ICD-10-CM

## 2013-05-03 MED ORDER — MIRABEGRON ER 25 MG PO TB24: 25.0000 mg | ORAL_TABLET | Freq: Every day | ORAL | Status: DC

## 2013-05-03 MED ORDER — AMLODIPINE BESYLATE 2.5 MG PO TABS: 2.5000 mg | ORAL_TABLET | Freq: Every day | ORAL | Status: DC

## 2013-05-03 MED ORDER — MELOXICAM 15 MG PO TABS: 15.0000 mg | ORAL_TABLET | Freq: Every day | ORAL | Status: DC | PRN

## 2013-05-03 NOTE — Assessment & Plan Note (Signed)
Ok to change to amlodipine 2.5mg  daily per pt preference for now

## 2013-05-03 NOTE — Assessment & Plan Note (Signed)
stable overall by history and exam, recent data reviewed with pt, and pt to continue medical treatment as before,  to f/u any worsening symptoms or concerns Lab Results  Component Value Date   HGBA1C 7.0 02/17/2013    

## 2013-05-03 NOTE — Patient Instructions (Signed)
OK to stop the amlodipine 5 mg, and to stop the Detrol LA as you have  Please take all new medication as prescribed- the amlodipine 2.5 mg per day, and the Myrbetrix for the bladder at 25 mg per day (there is a higher dose at 50 mg if needed)  Please continue all other medications as before, and refills have been done if requested - the meloxicam  Please have the pharmacy call with any other refills you may need.  Please take the statin as much as you can  Please continue your efforts at being more active, low cholesterol diet, and weight control.

## 2013-05-03 NOTE — Progress Notes (Signed)
Pre visit review using our clinic review tool, if applicable. No additional management support is needed unless otherwise documented below in the visit note. 

## 2013-05-03 NOTE — Assessment & Plan Note (Signed)
Ok to try change to myrbetrix, follow BP

## 2013-05-03 NOTE — Progress Notes (Signed)
Subjective:    Patient ID: Laura [name], female    DOB: 06-04-1945, 68 y.o.   MRN: [id number]  HPI  Pt denies chest pain, increased sob or doe, wheezing, orthopnea, PND, increased LE swelling, palpitations, dizziness or syncope.  Pt denies new neurological symptoms such as new headache, or facial or extremity weakness or numbness.  Has ongoing neck pan, mobic helps  Unfort, detrol la causes urinary retention? Except when not taking has 10 times nocturia, with freq and urgency, almost accidents, no falls. Denies urinary symptoms such as dysuria, flank pain, hematuria or n/v, fever, chills.    Taking the statin bit pnm;u 2-3 times per wk as makes her ache too much  .  Only taking the amlodipine 5 mg qod on average. Past Medical History  Diagnosis Date  . Diabetes mellitus   . Hyperlipidemia   . Hypertension   . Anxiety   . Reflux   . Vertigo   . Arthritis     back of neck, bones spurs on neck  . Sleep apnea     wears CPAP occasionally  . Cervical disc disease    Past Surgical History  Procedure Laterality Date  . Bladder surgery    . Cholecystectomy    . Partial hysterectomy    . Fibroids removed      breast (both breasts)  . Breast surgery      reports that she has never smoked. She has never used smokeless tobacco. She reports that she does not drink alcohol or use illicit drugs. family history includes Diabetes in her father, maternal aunt, and sister; Hypertension in her other. There is no history of Colon cancer, Esophageal cancer, Stomach cancer, or Rectal cancer. Allergies  Allergen Reactions  . Sulfa Antibiotics     Tongue swells, hives, itching  . Codeine     itch  . Crestor [Rosuvastatin Calcium] Other (See Comments)    Did something to memory   . Hydrocodone-Homatropine   . Lipitor [Atorvastatin Calcium] Other (See Comments)    Makes weak   . Prednisone     Nervous     Current Outpatient Prescriptions on File Prior to Visit  Medication Sig Dispense  Refill  . amLODipine (NORVASC) 5 MG tablet Take 1 tablet (5 mg total) by mouth daily.  90 tablet  3  . atorvastatin (LIPITOR) 10 MG tablet 1 tab by mouth every other day  45 tablet  3  . cholecalciferol (VITAMIN D) 1000 UNITS tablet Take 1,000 Units by mouth daily.        Marland Kitchen glimepiride (AMARYL) 1 MG tablet Take 0.5 tablets (0.5 mg total) by mouth 2 (two) times daily.  90 tablet  3  . Lancets MISC 1 application by Does not apply route daily.  100 each  11  . loperamide (IMODIUM A-D) 2 MG tablet Take 1-2 mg by mouth 4 (four) times daily as needed for diarrhea or loose stools.      Marland Kitchen LORazepam (ATIVAN) 0.5 MG tablet Take 1 tablet (0.5 mg total) by mouth daily as needed for anxiety.  30 tablet  2  . omeprazole (PRILOSEC) 20 MG capsule Take 1 capsule (20 mg total) by mouth daily.  90 capsule  3  . traMADol (ULTRAM) 50 MG tablet Take 1 tablet (50 mg total) by mouth at bedtime as needed.  30 tablet  2  . valsartan (DIOVAN) 320 MG tablet Take 1 tablet (320 mg total) by mouth daily.  90 tablet  3  .  acetaminophen (TYLENOL) 325 MG tablet Take 650 mg by mouth every 6 (six) hours as needed for pain.      . cyclobenzaprine (FLEXERIL) 10 MG tablet Take 1 tablet (10 mg total) by mouth 3 (three) times daily as needed for muscle spasms.  30 tablet  0  . glucose blood (ONE TOUCH ULTRA TEST) test strip Use as instructed  100 each  12  . meclizine (ANTIVERT) 12.5 MG tablet Take 1 tablet (12.5 mg total) by mouth 3 (three) times daily as needed for dizziness.  60 tablet  2  . Psyllium (METAMUCIL) 28.3 % POWD Take 5 mLs by mouth daily.       . simethicone (GAS-X) 80 MG chewable tablet Chew 80 mg by mouth 3 (three) times daily after meals.      . tolterodine (DETROL LA) 2 MG 24 hr capsule Take 1 capsule (2 mg total) by mouth daily.  90 capsule  3   No current facility-administered medications on file prior to visit.    Review of Systems  Constitutional: Negative for unexpected weight change, or unusual diaphoresis    HENT: Negative for tinnitus.   Eyes: Negative for photophobia and visual disturbance.  Respiratory: Negative for choking and stridor.   Gastrointestinal: Negative for vomiting and blood in stool.  Genitourinary: Negative for hematuria and decreased urine volume.  Musculoskeletal: Negative for acute joint swelling Skin: Negative for color change and wound.  Neurological: Negative for tremors and numbness other than noted  Psychiatric/Behavioral: Negative for decreased concentration or  hyperactivity.       Objective:   Physical Exam BP 150/78  Pulse 81  Temp(Src) 98.1 F (36.7 C) (Oral)  Wt 205 lb (92.987 kg)  SpO2 97% VS noted,  Constitutional: Pt appears well-developed and well-nourished.  HENT: Head: NCAT.  Right Ear: External ear normal.  Left Ear: External ear normal.  Eyes: Conjunctivae and EOM are normal. Pupils are equal, round, and reactive to light.  Neck: Normal range of motion. Neck supple.  Cardiovascular: Normal rate and regular rhythm.   Pulmonary/Chest: Effort normal and breath sounds normal.  Abd:  Soft, NT, non-distended, + BS Neurological: Pt is alert. Not confused  Skin: Skin is warm. No erythema.  Psychiatric: Pt behavior is normal. Thought content normal.     Assessment & Plan:

## 2013-05-03 NOTE — Assessment & Plan Note (Signed)
Ok for mobic prn,  to f/u any worsening symptoms or concerns 

## 2013-05-13 ENCOUNTER — Telehealth: Payer: Self-pay

## 2013-05-13 NOTE — Telephone Encounter (Signed)
Relevant patient education assigned to patient using Emmi. ° °

## 2013-05-31 ENCOUNTER — Encounter: Payer: Self-pay | Admitting: Internal Medicine

## 2013-06-01 ENCOUNTER — Encounter: Payer: Self-pay | Admitting: Internal Medicine

## 2013-06-07 ENCOUNTER — Encounter: Payer: Self-pay | Admitting: Internal Medicine

## 2013-06-10 ENCOUNTER — Encounter: Payer: Self-pay | Admitting: Internal Medicine

## 2013-06-14 ENCOUNTER — Ambulatory Visit: Payer: Medicare Other | Attending: Specialist | Admitting: Physical Therapy

## 2013-06-14 DIAGNOSIS — M542 Cervicalgia: Secondary | ICD-10-CM | POA: Insufficient documentation

## 2013-06-14 DIAGNOSIS — IMO0001 Reserved for inherently not codable concepts without codable children: Secondary | ICD-10-CM | POA: Diagnosis present

## 2013-06-16 ENCOUNTER — Ambulatory Visit (INDEPENDENT_AMBULATORY_CARE_PROVIDER_SITE_OTHER): Payer: 59 | Admitting: Psychiatry

## 2013-06-16 DIAGNOSIS — F0630 Mood disorder due to known physiological condition, unspecified: Secondary | ICD-10-CM

## 2013-06-23 ENCOUNTER — Ambulatory Visit (INDEPENDENT_AMBULATORY_CARE_PROVIDER_SITE_OTHER): Payer: 59 | Admitting: Psychiatry

## 2013-06-23 DIAGNOSIS — F0630 Mood disorder due to known physiological condition, unspecified: Secondary | ICD-10-CM

## 2013-06-27 ENCOUNTER — Ambulatory Visit: Payer: Medicare Other | Admitting: Physical Therapy

## 2013-06-29 ENCOUNTER — Encounter: Payer: Self-pay | Admitting: Physical Therapy

## 2013-06-29 ENCOUNTER — Ambulatory Visit (INDEPENDENT_AMBULATORY_CARE_PROVIDER_SITE_OTHER): Payer: 59 | Admitting: Psychiatry

## 2013-06-29 DIAGNOSIS — F0630 Mood disorder due to known physiological condition, unspecified: Secondary | ICD-10-CM

## 2013-07-04 ENCOUNTER — Encounter: Payer: Self-pay | Admitting: Physical Therapy

## 2013-07-06 ENCOUNTER — Encounter: Payer: Self-pay | Admitting: Physical Therapy

## 2013-07-06 ENCOUNTER — Ambulatory Visit (INDEPENDENT_AMBULATORY_CARE_PROVIDER_SITE_OTHER): Payer: 59 | Admitting: Psychiatry

## 2013-07-06 DIAGNOSIS — F0630 Mood disorder due to known physiological condition, unspecified: Secondary | ICD-10-CM

## 2013-07-18 ENCOUNTER — Telehealth: Payer: Self-pay | Admitting: *Deleted

## 2013-07-18 DIAGNOSIS — E119 Type 2 diabetes mellitus without complications: Secondary | ICD-10-CM

## 2013-07-18 NOTE — Telephone Encounter (Signed)
Left message on machine for patient to schedule a follow up appointment for diabetes. Bmet, a1c  Diabetic bundle

## 2013-07-28 ENCOUNTER — Telehealth: Payer: Self-pay

## 2013-07-28 ENCOUNTER — Encounter: Payer: Self-pay | Admitting: Internal Medicine

## 2013-07-28 NOTE — Telephone Encounter (Signed)
Received email from the paitent that request faxed to [facility] for a memory foam pillow was not received.  Refaxed today the letter once again written by Dr. Linna Darner to [facility]. At [phone], also mailed a copy of letter to the patient as well.

## 2013-07-30 ENCOUNTER — Other Ambulatory Visit: Payer: Self-pay | Admitting: Internal Medicine

## 2013-08-03 ENCOUNTER — Encounter: Payer: Self-pay | Admitting: Internal Medicine

## 2013-08-03 ENCOUNTER — Ambulatory Visit (INDEPENDENT_AMBULATORY_CARE_PROVIDER_SITE_OTHER): Payer: Medicare Other | Admitting: Internal Medicine

## 2013-08-03 VITALS — BP 130/78 | HR 80 | Temp 98.3°F | Wt 203.0 lb

## 2013-08-03 DIAGNOSIS — R599 Enlarged lymph nodes, unspecified: Secondary | ICD-10-CM

## 2013-08-03 DIAGNOSIS — R590 Localized enlarged lymph nodes: Secondary | ICD-10-CM

## 2013-08-03 DIAGNOSIS — R21 Rash and other nonspecific skin eruption: Secondary | ICD-10-CM

## 2013-08-03 MED ORDER — CEPHALEXIN 500 MG PO CAPS: 500.0000 mg | ORAL_CAPSULE | Freq: Two times a day (BID) | ORAL | Status: DC

## 2013-08-03 MED ORDER — MUPIROCIN 2 % EX OINT: TOPICAL_OINTMENT | CUTANEOUS | Status: DC

## 2013-08-03 NOTE — Progress Notes (Signed)
Pre visit review using our clinic review tool, if applicable. No additional management support is needed unless otherwise documented below in the visit note. 

## 2013-08-03 NOTE — Progress Notes (Signed)
   Subjective:    Patient ID: Laura [name], female    DOB: 1945-02-16, 68 y.o.   MRN: [id number]  HPI   She has had some preauricular soreness for approximately one week. Initially it radiated to her temple but it spread across the entire right side of her head subsequently. There is soreness to touch. Ibuprofen &  tramadol have been of minimal relief. She has had some pain in the ear without associated discharge or hearing loss. Tinnitus is a chronic issue 4 days after the soreness began she noticed a  flesh-colored cyst anterior and below the right mastoid. This is tender to palpation.  A day later she noted a raised area at the base of her scalp posteriorly which was itchy. Neosporin did not provide relief.   Review of Systems   She denies fever, frontal/facial sinus pain, sore throat, or nasal purulence.     Objective:   Physical Exam  Her hair is very fine. She does have patchy alopecia There is a raised erythematous rash at the scalp line at the right posterior occiput w/o vesicles or purulence. Upper partial There is a tender lymph node in the right posterior cervical chain.  General appearance is one of good health and nourishment w/o distress. Eyes: No conjunctival inflammation or scleral icterus is present. Oral exam: lips and gums are healthy appearing.There is no oropharyngeal erythema or exudate noted.  Heart:  Normal rate and regular rhythm. S1 and S2 normal without gallop, murmur, click, rub or other extra sounds   Lungs:Chest clear to auscultation; no wheezes, rhonchi,rales ,or rubs present.No increased work of breathing.  Musculoskeletal: Strength & tone good. Gait normal Skin:Warm & dry. No jaundice or tenting Lymphatic: No lymphadenopathy is noted in the axilla                Assessment & Plan:  #1 scalp dermatitis right posterior occiput, somewhat macerated w/o vesicles to suggest zoster. Appearance possibly affected by reaction to topical  antibiotic.  #2 tender right posterior lymph node  #3 preauricular pain which suggests TMJ but clinically this is not present. This could be referred pain or neuralgia.  Plan: see orders

## 2013-08-03 NOTE — Patient Instructions (Addendum)
Use a coal tar shampoo such as T-gel daily X 1 week . Antibiotics topically & orally as prescribed.

## 2013-08-03 NOTE — Progress Notes (Signed)
   Subjective:    Patient ID: Laura [name], female    DOB: Sep 13, 1945, 68 y.o.   MRN: [id number]  HPI Pt complains of R sided pre auricular soreness x 1 week. When the soreness first began it radiated to her R temple. Now the soreness has spread across the entire R side of her head. The area is sore to touch only. She has taken ibuprofen and tramadol for the pain with little relief. She denies inner ear pain, discharge or change in hearing. She has chronic tinnitus.    Four days after the soreness began the pt noticed a flesh colored cyst on the R mastoid. The cyst has not gotten bigger since it first appeared nor has the overlying skin become red. The cyst is tender. A day after she noticed the cyst she felt a raised sore at the base of her head at her hairline. The sore is itchy. The pt put neosporin on the site but had no relief.   Review of Systems  Constitutional: Negative for fever.  HENT: Negative for ear discharge, ear pain, sinus pressure and trouble swallowing.   Eyes: Negative for visual disturbance.  Neurological: Negative for headaches.       Objective:   Physical Exam   Epidermoid occlusion cyst post auricular, pre mastoid  Erythematous vesicular patch at R base of head      Assessment & Plan:

## 2013-08-05 ENCOUNTER — Encounter: Payer: Self-pay | Admitting: Internal Medicine

## 2013-08-05 DIAGNOSIS — G4733 Obstructive sleep apnea (adult) (pediatric): Secondary | ICD-10-CM

## 2013-08-07 ENCOUNTER — Encounter: Payer: Self-pay | Admitting: Internal Medicine

## 2013-08-08 ENCOUNTER — Other Ambulatory Visit: Payer: Self-pay | Admitting: Internal Medicine

## 2013-08-08 ENCOUNTER — Telehealth: Payer: Self-pay | Admitting: *Deleted

## 2013-08-08 MED ORDER — VALACYCLOVIR HCL 500 MG PO TABS: 500.0000 mg | ORAL_TABLET | Freq: Two times a day (BID) | ORAL | Status: DC

## 2013-08-08 MED ORDER — FLUCONAZOLE 150 MG PO TABS: 150.0000 mg | ORAL_TABLET | Freq: Every day | ORAL | Status: DC

## 2013-08-08 NOTE — Telephone Encounter (Signed)
Notified pt with md response. Pt states md was going to refer her to see dermatologist does he want her to still see dermatologist.../lmb

## 2013-08-08 NOTE — Telephone Encounter (Signed)
Diflucan 150 mg #1 Schedule F/U OV. I am concerned about Shingles due to lack of response to antibiotics

## 2013-08-08 NOTE — Telephone Encounter (Signed)
Left msg on triage stating md told her to let him know how her throat felt after taking medication. Pt states her throat is still painful lymph nodes has not improved. Also she has completed Amoxillian which gave her yeast infection wanting md to call something in...Laura [name]

## 2013-08-08 NOTE — Telephone Encounter (Signed)
Not until we see response to antiviral

## 2013-08-09 ENCOUNTER — Encounter: Payer: Self-pay | Admitting: Internal Medicine

## 2013-08-09 ENCOUNTER — Telehealth: Payer: Self-pay | Admitting: Internal Medicine

## 2013-08-09 NOTE — Telephone Encounter (Signed)
The patient was notified by Diabetic bundle phone call (message only was left) on 07/18/13 she needed to schedule appointment to followup on diabetes.  Labs for bmet and a1c at that time were ordered.  Advise if appointment is needed and lab to be done.

## 2013-08-09 NOTE — Telephone Encounter (Signed)
Called the patient informed of MD instructions.  The patient stated she has medicare and medicaid. She stated she would call at a more convenient time and schedule followup appointment with Dr. Jenny Reichmann and do labs after seeing PCP.

## 2013-08-09 NOTE — Telephone Encounter (Signed)
Pt called stated she got call telling to repeat A1C, but no order,can not find anything noted about this in the system. Is it time for pt to do another A1C. Please call pt

## 2013-08-09 NOTE — Telephone Encounter (Signed)
I cannot tell her insurance since the recent change to our EMR.  She would be due for lab work in august with office visit.  If have medicare, can simply make appt .  If has other insurance, please add cpx labs prior

## 2013-08-09 NOTE — Telephone Encounter (Signed)
Called pt no answer LMOM RTC.../lmb 

## 2013-08-30 ENCOUNTER — Ambulatory Visit (INDEPENDENT_AMBULATORY_CARE_PROVIDER_SITE_OTHER): Payer: Medicare Other | Admitting: Internal Medicine

## 2013-08-30 ENCOUNTER — Encounter: Payer: Self-pay | Admitting: Internal Medicine

## 2013-08-30 VITALS — BP 142/78 | HR 92 | Temp 98.4°F | Wt 204.0 lb

## 2013-08-30 DIAGNOSIS — IMO0001 Reserved for inherently not codable concepts without codable children: Secondary | ICD-10-CM

## 2013-08-30 DIAGNOSIS — Z0000 Encounter for general adult medical examination without abnormal findings: Secondary | ICD-10-CM

## 2013-08-30 DIAGNOSIS — R202 Paresthesia of skin: Secondary | ICD-10-CM | POA: Insufficient documentation

## 2013-08-30 DIAGNOSIS — M1990 Unspecified osteoarthritis, unspecified site: Secondary | ICD-10-CM

## 2013-08-30 DIAGNOSIS — R21 Rash and other nonspecific skin eruption: Secondary | ICD-10-CM | POA: Insufficient documentation

## 2013-08-30 DIAGNOSIS — E119 Type 2 diabetes mellitus without complications: Secondary | ICD-10-CM

## 2013-08-30 DIAGNOSIS — R209 Unspecified disturbances of skin sensation: Secondary | ICD-10-CM

## 2013-08-30 DIAGNOSIS — E1165 Type 2 diabetes mellitus with hyperglycemia: Secondary | ICD-10-CM

## 2013-08-30 MED ORDER — IBUPROFEN 800 MG PO TABS: 800.0000 mg | ORAL_TABLET | Freq: Three times a day (TID) | ORAL | Status: DC | PRN

## 2013-08-30 NOTE — Assessment & Plan Note (Signed)
stable overall by history and exam, recent data reviewed with pt, and pt to continue medical treatment as before,  to f/u any worsening symptoms or concerns Lab Results  Component Value Date   HGBA1C 7.0* 02/17/2013   For f/u lab

## 2013-08-30 NOTE — Patient Instructions (Addendum)
Please continue all other medications as before, and refills have been done if requested.  Please have the pharmacy call with any other refills you may need.  Please continue your efforts at being more active, low cholesterol diet, and weight control.  You are otherwise up to date with prevention measures today.  Please keep your appointments with your specialists as you may have planned  You will be contacted regarding the referral for:  Mammogram, and podiatry  Please return in 6 months, or sooner if needed, with Lab testing done 3-5 days before

## 2013-08-30 NOTE — Progress Notes (Signed)
Subjective:    Patient ID: Laura [name], female    DOB: [birth year], 68 y.o.   MRN: [id number]  HPI   Here for wellness and f/u;  Overall doing ok;  Pt denies CP, worsening SOB, DOE, wheezing, orthopnea, PND, worsening LE edema, palpitations, dizziness or syncope.  Pt denies neurological change such as new headache, facial or extremity weakness.  Pt denies polydipsia, polyuria, or low sugar symptoms. Pt states overall good compliance with treatment and medications, good tolerability, and has been trying to follow lower cholesterol diet.  Pt denies worsening depressive symptoms, suicidal ideation or panic. No fever, night sweats, wt loss, loss of appetite, or other constitutional symptoms.  Pt states good ability with ADL's, has low fall risk, home safety reviewed and adequate, no other significant changes in hearing or vision, and only occasionally active with exercise.  Also here -  co scalp issue with rash with pain to post right head assoc with a LN swollen size of a marble per pt, tender to touch area , tx with antibiotic (took cephalexin, but did not use the bactroban) July 22 with Dr Linna Darner, some improved pain but still painful, at one point had a scab. Ointment used, not sure if helped, scab gone now but not sure if healed.  No hx of shingles..  Wearing CPAP mask with mild breaking out of skin about the mouth, with strap to the back, wants a new mask but unable to see pulm for 2 mo.  Also second issue today - some numbness to toes and bottom of feet, right > left, none prior, no pain or weakness.  B12 level feb 2015 normal. Also asks for ibuprofen for recurring chronic bilat shoulder and neck pain at night. Tramadol not working as well, makes her sleepy.  delcines flu shot today Past Medical History  Diagnosis Date  . Diabetes mellitus   . Hyperlipidemia   . Hypertension   . Anxiety   . Reflux   . Vertigo   . Arthritis     back of neck, bones spurs on neck  . Sleep apnea     wears CPAP  occasionally  . Cervical disc disease    Past Surgical History  Procedure Laterality Date  . Bladder surgery    . Cholecystectomy    . Partial hysterectomy    . Fibroids removed      breast (both breasts)  . Breast surgery      reports that she has never smoked. She has never used smokeless tobacco. She reports that she does not drink alcohol or use illicit drugs. family history includes Diabetes in her father, maternal aunt, and sister; Hypertension in her other. There is no history of Colon cancer, Esophageal cancer, Stomach cancer, or Rectal cancer. Allergies  Allergen Reactions  . Sulfa Antibiotics     Tongue swells, hives, itching  . Codeine     itch  . Crestor [Rosuvastatin Calcium] Other (See Comments)    Did something to memory   . Hydrocodone-Homatropine   . Lipitor [Atorvastatin Calcium] Other (See Comments)    Makes weak   . Prednisone     Nervous     Current Outpatient Prescriptions on File Prior to Visit  Medication Sig Dispense Refill  . cholecalciferol (VITAMIN D) 1000 UNITS tablet Take 1,000 Units by mouth daily.        Marland Kitchen glimepiride (AMARYL) 1 MG tablet Take 0.5 tablets (0.5 mg total) by mouth 2 (two) times daily.  90 tablet  3  . LORazepam (ATIVAN) 0.5 MG tablet Take 1 tablet (0.5 mg total) by mouth daily as needed for anxiety.  30 tablet  2  . meloxicam (MOBIC) 15 MG tablet Take 1 tablet (15 mg total) by mouth daily as needed for pain.  30 tablet  5  . mirabegron ER (MYRBETRIQ) 25 MG TB24 tablet Take 1 tablet (25 mg total) by mouth daily.  30 tablet  11  . omeprazole (PRILOSEC) 20 MG capsule Take 1 capsule (20 mg total) by mouth daily.  90 capsule  3  . Psyllium (METAMUCIL) 28.3 % POWD Take 5 mLs by mouth daily.       . simethicone (GAS-X) 80 MG chewable tablet Chew 80 mg by mouth 3 (three) times daily after meals.      . traMADol (ULTRAM) 50 MG tablet Take 1 tablet (50 mg total) by mouth at bedtime as needed.  30 tablet  2  . amLODipine (NORVASC) 2.5 MG  tablet Take 1 tablet (2.5 mg total) by mouth daily.  90 tablet  3  . fluconazole (DIFLUCAN) 150 MG tablet Take 1 tablet (150 mg total) by mouth daily.  1 tablet  0  . glucose blood (ONE TOUCH ULTRA TEST) test strip Use as instructed  100 each  12  . Lancets MISC 1 application by Does not apply route daily.  100 each  11  . loperamide (IMODIUM A-D) 2 MG tablet Take 1-2 mg by mouth 4 (four) times daily as needed for diarrhea or loose stools.      . valsartan (DIOVAN) 320 MG tablet Take 1 tablet (320 mg total) by mouth daily.  90 tablet  3   No current facility-administered medications on file prior to visit.   Review of Systems Constitutional: Negative for increased diaphoresis, other activity, appetite or other siginficant weight change  HENT: Negative for worsening hearing loss, ear pain, facial swelling, mouth sores and neck stiffness.   Eyes: Negative for other worsening pain, redness or visual disturbance.  Respiratory: Negative for shortness of breath and wheezing.   Cardiovascular: Negative for chest pain and palpitations.  Gastrointestinal: Negative for diarrhea, blood in stool, abdominal distention or other pain Genitourinary: Negative for hematuria, flank pain or change in urine volume.  Musculoskeletal: Negative for myalgias or other joint complaints.  Skin: Negative for color change and wound.  Neurological: Negative for syncope and numbness. other than noted Hematological: Negative for adenopathy. or other swelling Psychiatric/Behavioral: Negative for hallucinations, self-injury, decreased concentration or other worsening agitation.      Objective:   Physical Exam BP 142/78  Pulse 92  Temp(Src) 98.4 F (36.9 C) (Oral)  Wt 204 lb (92.534 kg)  SpO2 97% VS noted,  Constitutional: Pt is oriented to person, place, and time. Appears well-developed and well-nourished.  Head: Normocephalic and atraumatic.  Right Ear: External ear normal.  Left Ear: External ear normal.  Nose:  Nose normal.  Mouth/Throat: Oropharynx is clear and moist.  Eyes: Conjunctivae and EOM are normal. Pupils are equal, round, and reactive to light.  Neck: Normal range of motion. Neck supple. No JVD present. No tracheal deviation present.  Cardiovascular: Normal rate, regular rhythm, normal heart sounds and intact distal pulses.   Pulmonary/Chest: Effort normal and breath sounds without rales or wheezing  Abdominal: Soft. Bowel sounds are normal. NT. No HSM  Musculoskeletal: Normal range of motion. Exhibits no edema.  Lymphadenopathy:  Has no cervical adenopathy.  Neurological: Pt is alert and oriented to person, place, and time.  Pt has normal reflexes. No cranial nerve deficit. Motor grossly intact, + decr sens to LT to feet Skin: Skin is warm and dry. No rash noted. exce[[t for mult grouped < 5 mm erythem areas post right occiput/high cervical, with one palpable mild tender sub q nodule- prob LN < 1 cm Psychiatric:  Has 1+ nervous mood and affect. Behavior is normal.     Assessment & Plan:

## 2013-08-30 NOTE — Assessment & Plan Note (Signed)
Suspect periph neuropathy - for podiatry referral

## 2013-08-30 NOTE — Assessment & Plan Note (Signed)

## 2013-08-30 NOTE — Progress Notes (Signed)
Pre visit review using our clinic review tool, if applicable. No additional management support is needed unless otherwise documented below in the visit note. 

## 2013-08-30 NOTE — Assessment & Plan Note (Signed)
?   Recent shingles, now improved,  to f/u any worsening symptoms or concerns

## 2013-08-30 NOTE — Assessment & Plan Note (Signed)
With neck pain - for ibuprofen prn

## 2013-09-05 ENCOUNTER — Other Ambulatory Visit: Payer: Self-pay | Admitting: Internal Medicine

## 2013-09-05 DIAGNOSIS — Z1231 Encounter for screening mammogram for malignant neoplasm of breast: Secondary | ICD-10-CM

## 2013-09-08 ENCOUNTER — Ambulatory Visit
Admission: RE | Admit: 2013-09-08 | Discharge: 2013-09-08 | Disposition: A | Discharge status: Home / Self Care | Payer: Medicare Other | Source: Ambulatory Visit | Attending: Internal Medicine | Admitting: Internal Medicine

## 2013-09-08 DIAGNOSIS — Z1231 Encounter for screening mammogram for malignant neoplasm of breast: Secondary | ICD-10-CM

## 2013-09-16 ENCOUNTER — Ambulatory Visit: Payer: Medicare Other | Admitting: Podiatrist

## 2013-09-19 ENCOUNTER — Encounter: Payer: Self-pay | Admitting: Internal Medicine

## 2013-09-23 ENCOUNTER — Encounter: Payer: Self-pay | Admitting: Internal Medicine

## 2013-09-23 DIAGNOSIS — G4733 Obstructive sleep apnea (adult) (pediatric): Secondary | ICD-10-CM

## 2013-10-03 ENCOUNTER — Ambulatory Visit (INDEPENDENT_AMBULATORY_CARE_PROVIDER_SITE_OTHER): Payer: Medicare Other | Admitting: Neurology

## 2013-10-03 ENCOUNTER — Ambulatory Visit (INDEPENDENT_AMBULATORY_CARE_PROVIDER_SITE_OTHER): Payer: Self-pay

## 2013-10-03 DIAGNOSIS — R209 Unspecified disturbances of skin sensation: Secondary | ICD-10-CM

## 2013-10-03 DIAGNOSIS — Z0289 Encounter for other administrative examinations: Secondary | ICD-10-CM

## 2013-10-03 DIAGNOSIS — G544 Lumbosacral root disorders, not elsewhere classified: Secondary | ICD-10-CM

## 2013-10-03 NOTE — Procedures (Signed)
     HISTORY:  Laura [name] is a 68 year old patient with a history of diabetes. She has noted a one-month history of some numbness in both feet, but she denies any low back pain or pain radiating down the legs. She does have some chronic neck and shoulder discomfort. She is being evaluated for possible neuropathy or a lumbosacral radiculopathy.  NERVE CONDUCTION STUDIES:  Nerve conduction studies were performed on both lower extremities. The distal motor latencies and motor amplitudes for the peroneal and posterior tibial nerves were within normal limits. The nerve conduction velocities for these nerves were also normal. The H reflex latencies were normal. The sensory latencies for the peroneal and sural nerves were within normal limits.   EMG STUDIES:  EMG study was performed on the right lower extremity:  The tibialis anterior muscle reveals 2 to 4K motor units with full recruitment. No fibrillations or positive waves were seen. The peroneus tertius muscle reveals 2 to 4K motor units with full recruitment. No fibrillations or positive waves were seen. The medial gastrocnemius muscle reveals 1 to 3K motor units with full recruitment. No fibrillations or positive waves were seen. The vastus lateralis muscle reveals 2 to 4K motor units with full recruitment. No fibrillations or positive waves were seen. The iliopsoas muscle reveals 2 to 4K motor units with full recruitment. No fibrillations or positive waves were seen. The biceps femoris muscle (long head) reveals 2 to 4K motor units with full recruitment. No fibrillations or positive waves were seen. The lumbosacral paraspinal muscles were tested at 3 levels, and revealed no abnormalities of insertional activity at the upper and middle levels tested. One plus positive waves were seen in the lower level. There was good relaxation.  EMG study was performed on the left lower extremity:  The tibialis anterior muscle reveals 2 to 4K motor units  with full recruitment. No fibrillations or positive waves were seen. The peroneus tertius muscle reveals 2 to 4K motor units with full recruitment. No fibrillations or positive waves were seen. The medial gastrocnemius muscle reveals 1 to 3K motor units with full recruitment. No fibrillations or positive waves were seen. The vastus lateralis muscle reveals 2 to 4K motor units with full recruitment. No fibrillations or positive waves were seen. The iliopsoas muscle reveals 2 to 4K motor units with full recruitment. 2+ fibrillations and positive waves were seen. The biceps femoris muscle (long head) reveals 2 to 4K motor units with full recruitment. No fibrillations or positive waves were seen. The lumbosacral paraspinal muscles were tested at 3 levels, and revealed no abnormalities of insertional activity at the upper and middle levels tested. One plus positive waves were seen in the lower level. There was good relaxation.   IMPRESSION:  Nerve conduction studies done on both lower extremities were within normal limits. No evidence of a peripheral neuropathy is seen. An early peripheral neuropathy or a small fiber neuropathy may be missed by standard nerve conduction studies, and clinical correlation is required. EMG evaluation of the right lower extremity was unremarkable, but mild acute denervation was seen right lumbosacral paraspinal muscles. A low-grade lumbosacral radiculopathy of indeterminate level cannot be excluded on the right. With the left lower extremity, there is evidence of a mild acute L3 or L4 radiculopathy. No other abnormalities were seen.   Jill Alexanders MD 10/03/2013 11:09 AM  [facility] [address] [place], [place] [address]  Phone [phone] Fax [phone]

## 2013-10-04 ENCOUNTER — Encounter: Payer: Self-pay | Admitting: Internal Medicine

## 2013-10-07 ENCOUNTER — Encounter: Payer: Self-pay | Admitting: Neurology

## 2013-10-07 DIAGNOSIS — G629 Polyneuropathy, unspecified: Secondary | ICD-10-CM | POA: Insufficient documentation

## 2013-10-10 ENCOUNTER — Encounter: Payer: Self-pay | Admitting: Diagnostic Neuroimaging

## 2013-10-10 ENCOUNTER — Ambulatory Visit (INDEPENDENT_AMBULATORY_CARE_PROVIDER_SITE_OTHER): Payer: Medicare Other | Admitting: Diagnostic Neuroimaging

## 2013-10-10 DIAGNOSIS — G629 Polyneuropathy, unspecified: Secondary | ICD-10-CM

## 2013-10-10 DIAGNOSIS — R209 Unspecified disturbances of skin sensation: Secondary | ICD-10-CM

## 2013-10-10 DIAGNOSIS — Z9989 Dependence on other enabling machines and devices: Secondary | ICD-10-CM

## 2013-10-10 DIAGNOSIS — G609 Hereditary and idiopathic neuropathy, unspecified: Secondary | ICD-10-CM

## 2013-10-10 DIAGNOSIS — G544 Lumbosacral root disorders, not elsewhere classified: Secondary | ICD-10-CM

## 2013-10-10 DIAGNOSIS — G4733 Obstructive sleep apnea (adult) (pediatric): Secondary | ICD-10-CM

## 2013-10-10 NOTE — Progress Notes (Signed)
[facility]  PATIENT: Laura [name] DOB: [birth year]  REFERRING CLINICIAN: Louanne Skye HISTORY FROM: patient  REASON FOR VISIT: new consult    HISTORICAL  CHIEF COMPLAINT:  Chief Complaint  Patient presents with  . Neurologic Problem    HISTORY OF PRESENT ILLNESS:   68 year old right-handed female here for evaluation of numbness in the feet.  For past 1 month patient has noticed numbness in the bilateral feet, toes, mainly in the lateral aspect of the feet. She describes a numb tingling sensation. No significant pain. No burning or pins and needles. No symptoms of upper ankles. No low back pain or radiating symptoms. The symptoms in her fingers.  Patient has diagnoses of diabetes for past 2 years, with recent A1c 7.0. Her blood sugar control recently he has been slightly worse.  Patient had nerve conduction EMG study recently showing possible right L3-4 radiculopathy without large fiber neuropathy.   REVIEW OF SYSTEMS: Full 14 system review of systems performed and notable only for fevers chills weight gain palpitation murmur hearing loss ringing in ears birthmarks incontinence diarrhea constipation cough snoring blurred vision feeling hot feeling cold memory loss numbness dizziness insomnia sleeping as snoring restless legs anxiety too much sleep decreased energy change in appetite.  ALLERGIES: Allergies  Allergen Reactions  . Sulfa Antibiotics     Tongue swells, hives, itching  . Codeine     itch  . Crestor [Rosuvastatin Calcium] Other (See Comments)    Did something to memory   . Hydrocodone-Homatropine   . Lipitor [Atorvastatin Calcium] Other (See Comments)    Makes weak   . Prednisone     Nervous      HOME MEDICATIONS: Outpatient Prescriptions Prior to Visit  Medication Sig Dispense Refill  . fluconazole (DIFLUCAN) 150 MG tablet Take 1 tablet (150 mg total) by mouth daily.  1 tablet  0  . glimepiride (AMARYL) 1 MG tablet Take 0.5 tablets (0.5  mg total) by mouth 2 (two) times daily.  90 tablet  3  . ibuprofen (ADVIL,MOTRIN) 800 MG tablet Take 1 tablet (800 mg total) by mouth every 8 (eight) hours as needed.  30 tablet  0  . LORazepam (ATIVAN) 0.5 MG tablet Take 1 tablet (0.5 mg total) by mouth daily as needed for anxiety.  30 tablet  2  . traMADol (ULTRAM) 50 MG tablet Take 1 tablet (50 mg total) by mouth at bedtime as needed.  30 tablet  2  . valsartan (DIOVAN) 320 MG tablet Take 1 tablet (320 mg total) by mouth daily.  90 tablet  3  . amLODipine (NORVASC) 2.5 MG tablet Take 1 tablet (2.5 mg total) by mouth daily.  90 tablet  3  . cholecalciferol (VITAMIN D) 1000 UNITS tablet Take 1,000 Units by mouth daily.        Marland Kitchen glucose blood (ONE TOUCH ULTRA TEST) test strip Use as instructed  100 each  12  . Lancets MISC 1 application by Does not apply route daily.  100 each  11  . loperamide (IMODIUM A-D) 2 MG tablet Take 1-2 mg by mouth 4 (four) times daily as needed for diarrhea or loose stools.      . meloxicam (MOBIC) 15 MG tablet Take 1 tablet (15 mg total) by mouth daily as needed for pain.  30 tablet  5  . mirabegron ER (MYRBETRIQ) 25 MG TB24 tablet Take 1 tablet (25 mg total) by mouth daily.  30 tablet  11  . omeprazole (PRILOSEC) 20 MG  capsule Take 1 capsule (20 mg total) by mouth daily.  90 capsule  3  . Psyllium (METAMUCIL) 28.3 % POWD Take 5 mLs by mouth daily.       . simethicone (GAS-X) 80 MG chewable tablet Chew 80 mg by mouth 3 (three) times daily after meals.       No facility-administered medications prior to visit.    PAST MEDICAL HISTORY: Past Medical History  Diagnosis Date  . Diabetes mellitus   . Hyperlipidemia   . Hypertension   . Anxiety   . Reflux   . Vertigo   . Arthritis     back of neck, bones spurs on neck  . Sleep apnea     wears CPAP occasionally  . Cervical disc disease   . Neuropathy     PAST SURGICAL HISTORY: Past Surgical History  Procedure Laterality Date  . Bladder surgery    .  Cholecystectomy    . Partial hysterectomy    . Fibroids removed      breast (both breasts)  . Breast surgery      FAMILY HISTORY: Family History  Problem Relation Age of Onset  . Hypertension Other   . Diabetes Father   . Diabetes Sister   . Diabetes Maternal Aunt   . Colon cancer Neg Hx   . Esophageal cancer Neg Hx   . Stomach cancer Neg Hx   . Rectal cancer Neg Hx     SOCIAL HISTORY:  History   Social History  . Marital Status: Divorced    Spouse Name: N/A    Number of Children: 59  . Years of Education: 16   Occupational History  . 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 Smoking status: Never Smoker   . Smokeless tobacco: Never Used  . Alcohol Use: No  . Drug Use: No  . Sexual Activity: Not Currently   Other Topics Concern  . Not on file   Social History Narrative  . No narrative on file     PHYSICAL EXAM  There were no vitals filed for this visit.  Not recorded    There is no weight on file to calculate BMI.  GENERAL EXAM: Patient is in no distress; well developed, nourished and groomed; neck is supple  CARDIOVASCULAR: Regular rate and rhythm, no murmurs, no carotid bruits  NEUROLOGIC: MENTAL STATUS: awake, alert, oriented to person, place and time, recent and remote memory intact, normal attention and concentration, language fluent, comprehension intact, naming intact, fund of knowledge appropriate CRANIAL NERVE: no papilledema on fundoscopic exam, pupils equal and reactive to light, visual fields full to confrontation, extraocular muscles intact, no nystagmus, facial sensation and strength symmetric, hearing intact, palate elevates symmetrically, uvula midline, shoulder shrug symmetric, tongue midline. MOTOR: normal bulk and tone, full strength in the BUE, BLE SENSORY: DECR PP IN TOES; VIB, PROPRIO, LT NORMAL.  COORDINATION: finger-nose-finger, fine finger movements normal REFLEXES: BUE 2, KNEES 1, ANKLES TRACE (2+  WITH REINFORCEMENT) GAIT/STATION: narrow based gait; able to walk on toes, heels; TANDEM UNSTEADY.    DIAGNOSTIC DATA (LABS, IMAGING, TESTING) - I reviewed patient records, labs, notes, testing and imaging myself where available.  Lab Results  Component Value Date   WBC 9.1 02/17/2013   HGB 13.4 02/17/2013   HCT 40.3 02/17/2013   MCV 87.4 02/17/2013   PLT 271.0 02/17/2013      Component Value Date/Time   NA 138 02/17/2013 1154   K 4.2 02/17/2013 1154  CL 105 02/17/2013 1154   CO2 26 02/17/2013 1154   GLUCOSE 119* 02/17/2013 1154   BUN 16 02/17/2013 1154   CREATININE 0.8 02/17/2013 1154   CALCIUM 10.2 02/17/2013 1154   PROT 7.8 02/17/2013 1154   ALBUMIN 4.3 02/17/2013 1154   AST 20 02/17/2013 1154   ALT 21 02/17/2013 1154   ALKPHOS 68 02/17/2013 1154   BILITOT 0.6 02/17/2013 1154   GFRNONAA 88* 03/02/2012 1205   GFRAA >90 03/02/2012 1205   Lab Results  Component Value Date   CHOL 232* 02/17/2013   HDL 51.40 02/17/2013   LDLCALC 160* 01/05/2011   LDLDIRECT 168.3 02/17/2013   TRIG 126.0 02/17/2013   CHOLHDL 5 02/17/2013   Lab Results  Component Value Date   HGBA1C 7.0* 02/17/2013   Lab Results  Component Value Date   VITAMINB12 812 02/17/2013   Lab Results  Component Value Date   TSH 1.87 02/17/2013    10/03/13 NCV/EMG (Dr. Jannifer Franklin) - No evidence of a peripheral neuropathy is seen. Mild acute L3 or L4 radiculopathy.   ASSESSMENT AND PLAN  68 y.o. year old female here with mild numbness in bilateral feet, toes, for past 1 month.   Most likely represents diabetic small fiber neuropathy. Superimposed right L3-4 radiculopathy on EMG is noted although patient does not have significant right lumbar radiculopathy symptoms. At this point patient does not have significant pain. I recommended conservative management with tight blood sugar control, taking a general multivitamin, and observation of symptoms. If patient develops significant lumbar radicular symptoms we may check MRI of the lumbar spine. If numbness  transitions to painful neuropathy symptoms, then we may consider gabapentin or other neuropathic pain medication.  Separately the patient has asked me to be referred to our sleep neurology colleagues for replacement of CPAP mask. I will facilitate this for the patient.   PLAN: - observation of small fiber neuropathy symptoms - refer to sleep consult with Dr. Rexene Alberts  Orders Placed This Encounter  Procedures  . Ambulatory referral to Sleep Studies   Return if symptoms worsen or fail to improve.    Penni Bombard, MD 8/75/6433, 29:51 PM Certified in Neurology, Neurophysiology and Neuroimaging  [facility] [address] [place],  [address] [phone]

## 2013-10-10 NOTE — Patient Instructions (Signed)
Monitor sugars and diabetes.   Take daily multivitamin. Eat more vegetables.

## 2013-10-12 ENCOUNTER — Telehealth: Payer: Self-pay | Admitting: Neurology

## 2013-10-12 DIAGNOSIS — G4733 Obstructive sleep apnea (adult) (pediatric): Secondary | ICD-10-CM

## 2013-10-12 DIAGNOSIS — E669 Obesity, unspecified: Secondary | ICD-10-CM

## 2013-10-12 DIAGNOSIS — G2581 Restless legs syndrome: Secondary | ICD-10-CM

## 2013-10-12 DIAGNOSIS — R400 Somnolence: Secondary | ICD-10-CM

## 2013-10-12 NOTE — Telephone Encounter (Signed)
Dr. Leta Baptist  ,refers patient for attended sleep study.  Height: 5'4"  Weight: 204lbs  BMI: 35  Past Medical History:  Diabetes mellitus  .  Hyperlipidemia  .  Hypertension  .  UTI (lower urinary tract infection)  .  Anxiety  .  Reflux  06/29/2011  .  Vertigo  .  Arthritis  back of neck, bones spurs on neck  .  Sleep apnea  wears CPAP occasionally    Sleep Symptoms: insomnia sleeping as snoring restless legs anxiety too much sleep decreased energy change in appetite. Sleep apnea  wears CPAP occasionally   Epworth Score: Unknown   Medication: AmLODIPine Besylate (Tab) NORVASC 2.5 MG Take 1 tablet (2.5 mg total) by mouth daily. Cholecalciferol (Tab) VITAMIN D 1000 UNITS Take 1,000 Units by mouth daily. Fluconazole (Tab) DIFLUCAN 150 MG Take 1 tablet (150 mg total) by mouth daily. Glimepiride (Tab) AMARYL 1 MG Take 0.5 tablets (0.5 mg total) by mouth 2 (two) times daily. Glucose Blood (Strip) glucose blood Use as instructed Ibuprofen (Tab) ADVIL,MOTRIN 800 MG Take 1 tablet (800 mg total) by mouth every 8 (eight) hours as needed. LORazepam (Tab) ATIVAN 0.5 MG Take 1 tablet (0.5 mg total) by mouth daily as needed for anxiety. Lancets (Misc) Lancets 1 application by Does not apply route daily. Loperamide HCl (Tab) IMODIUM A-D 2 MG Take 1-2 mg by mouth 4 (four) times daily as needed for diarrhea or loose stools. Meloxicam (Tab) MOBIC 15 MG Take 1 tablet (15 mg total) by mouth daily as needed for pain. Mirabegron (Tablet SR 24 hr) MYRBETRIQ 25 MG Take 1 tablet (25 mg total) by mouth daily. Omeprazole (Capsule Delayed Release) PRILOSEC 20 MG Take 1 capsule (20 mg total) by mouth daily. Psyllium (Powder) Psyllium 28.3 % Take 5 mLs by mouth daily. Simethicone (Chew Tab) MYLICON 80 MG Chew 80 mg by mouth 3 (three) times daily after meals. TraMADol HCl (Tab) ULTRAM 50 MG Take 1 tablet (50 mg total) by mouth at bedtime as needed. Valsartan (Tab) DIOVAN 320 MG Take 1 tablet (320 mg total) by  mouth daily.     Ins: [employer]/Medicaid   Assessment & Plan: 68 y.o. year old female here with mild numbness in bilateral feet, toes, for past 1 month.  Most likely represents diabetic small fiber neuropathy. Superimposed right L3-4 radiculopathy on EMG is noted although patient does not have significant right lumbar radiculopathy symptoms. At this point patient does not have significant pain. I recommended conservative management with tight blood sugar control, taking a general multivitamin, and observation of symptoms. If patient develops significant lumbar radicular symptoms we may check MRI of the lumbar spine. If numbness transitions to painful neuropathy symptoms, then we may consider gabapentin or other neuropathic pain medication.   Separately the patient has asked me to be referred to our sleep neurology colleagues for replacement of CPAP mask. I will facilitate this for the patient.   PLAN:  - observation of small fiber neuropathy symptoms  - refer to sleep consult with Dr. Rexene Alberts  Orders Placed This Encounter   Procedures   .  Ambulatory referral to Sleep Studies   Return if symptoms worsen or fail to improve.   Please review patient information and submit instructions for scheduling and orders for sleep technologist. Thank you.

## 2013-10-12 NOTE — Telephone Encounter (Signed)
Sleep study request review: This patient has an underlying medical history of diabetes, obesity, hyperlipidemia, hypertension, peripheral neuropathy, anxiety, reflux disease, arthritis and a prior diagnosis of obstructive sleep apnea for which she has been using CPAP occasionally and is referred by Dr. Leta Baptist for an attended sleep study due to a report of daytime somnolence and restless leg symptoms. She had a sleep study over 5 years ago. I will order a split-night sleep study and see the patient in sleep medicine consultation afterwards. Please print this note and attach to chart.   Technologist instructions: Please score at 4% and split if 2 hour estimated AHI >20/h.    Star Age, MD, PhD [facility])

## 2013-10-25 ENCOUNTER — Telehealth: Payer: Self-pay | Admitting: *Deleted

## 2013-10-25 NOTE — Telephone Encounter (Signed)
[facility] Triage Call Report Triage Record Num: [id number] Operator: Soledad Gerlach Patient Name: Laura [name] Call Date & Time: 10/22/2013 1:09:56PM Patient Phone: [phone] PCP: Cathlean Cower Patient Gender: Female PCP Fax : [phone] Patient DOB: 04-Jun-1945 Practice Name: [facility] Reason for Call: Caller: [name]/Patient; PCP: Cathlean Cower (Adults only); CB#: [phone]; Call regarding High blood sugar - has medication questions. States her blood sugar was 154 at 0300 10/22/13. States she took another half tablet then, and checked her blood sugar at 1100 10/22/13 and noted it was 184. States she has not had anything to eat today. States her blood sugar usually runs lower than that. Takes glymeperide 1mg , 1/2 tab BID. States she is taking this BID as prescribed. Patient is anxious about this reading. Per diabetes control problems protocol, emergent symptoms denied; advised to continue taking oral medication as prescribed, check blood sugars as directed. Offered appt in office; declines at this time, but states will follow up with her psychiatrist regarding anxiety. Callback parameters given. Protocol(s) Used: Diabetes: Control Problems Recommended Outcome per Protocol: See Provider within 24 hours Reason for Outcome: All other situations Care Advice: ~ SYMPTOM / CONDITION MANAGEMENT Diabetes Action Plan: - Follow recommended action plan and diet - Check blood sugar as recommended - Take diabetic pills or insulin as ordered - Follow action plan for illness. ~ 10/

## 2013-11-07 ENCOUNTER — Other Ambulatory Visit: Payer: Self-pay | Admitting: Internal Medicine

## 2013-11-07 ENCOUNTER — Encounter: Payer: Self-pay | Admitting: Pulmonary Disease

## 2013-11-07 ENCOUNTER — Ambulatory Visit (INDEPENDENT_AMBULATORY_CARE_PROVIDER_SITE_OTHER): Payer: Medicare Other | Admitting: Pulmonary Disease

## 2013-11-07 VITALS — BP 122/76 | HR 81 | Temp 98.3°F | Ht 64.0 in | Wt 203.6 lb

## 2013-11-07 DIAGNOSIS — G4733 Obstructive sleep apnea (adult) (pediatric): Secondary | ICD-10-CM

## 2013-11-07 NOTE — Progress Notes (Signed)
Subjective:    Patient ID: Laura [name], female    DOB: October 14, 1945, 68 y.o.   MRN: [id number]  HPI The patient is a 68 year old female who I've been asked to see for obstructive sleep apnea. She was diagnosed in 2007 with moderate to severe OSA, with an AHI of 32 events per hour. She was started on C Pap at that time, and tells me that she has been compliant with a good response.  Unfortunately, her machine is over 76 years old, and she has not received new supplies or a mass in many years. She thinks that her machine may not be working properly. She is now having frequent awakenings and is not rested in the mornings upon arising. She is having lots of mask leak issues. She notes definite daytime sleepiness with inactivity at times, and rapport score today is 9. Her weight has changed very little since 2007.   Sleep Questionnaire What time do you typically go to bed?( Between what hours) 7-8pm or 11p-12 AM. also sleeps during the day 7-8pm or 11p-12 AM. also sleeps during the day at 1356 on 11/07/13 by Inge Rise, CMA How long does it take you to fall asleep? minutes-hours minutes-hours at 1356 on 11/07/13 by Inge Rise, CMA How many times during the night do you wake up? 10 10 at 1356 on 11/07/13 by Inge Rise, CMA What time do you get out of bed to start your day? 0930 0930 at 1356 on 11/07/13 by Inge Rise, CMA Do you drive or operate heavy machinery in your occupation? No No at 1356 on 11/07/13 by Inge Rise, CMA How much has your weight changed (up or down) over the past two years? (In pounds) 10 lb (4.536 kg) 10 lb (4.536 kg) at 1356 on 11/07/13 by Inge Rise, CMA Have you ever had a sleep study before? Yes Yes at 1356 on 11/07/13 by Inge Rise, CMA If yes, location of study? [facility] [place] at 1356 on 11/07/13 by Inge Rise, CMA If yes, date of study? 2006 2006 at 1356 on 11/07/13 by Inge Rise, CMA Do you currently use CPAP? Yes Yes at 1356 on  11/07/13 by Inge Rise, CMA If so, what pressure? 7 7 at 1356 on 11/07/13 by Inge Rise, CMA Do you wear oxygen at any time? No No at 1356 on 11/07/13 by Inge Rise, CMA   Review of Systems  Constitutional: Negative for fever and unexpected weight change.  HENT: Positive for congestion and sneezing. Negative for dental problem, ear pain, nosebleeds, postnasal drip, rhinorrhea, sinus pressure, sore throat and trouble swallowing.   Eyes: Negative for redness and itching.  Respiratory: Positive for cough and shortness of breath. Negative for chest tightness and wheezing.   Cardiovascular: Negative for palpitations and leg swelling.  Gastrointestinal: Negative for nausea and vomiting.  Genitourinary: Negative for dysuria.  Musculoskeletal: Positive for arthralgias. Negative for joint swelling.  Skin: Positive for rash.  Neurological: Negative for headaches.  Hematological: Does not bruise/bleed easily.  Psychiatric/Behavioral: Positive for dysphoric mood. The patient is nervous/anxious.        Objective:   Physical Exam Constitutional:  obese, no acute distress  HENT:  Nares patent without discharge, narrowed bilat.   Oropharynx without exudate, palate and uvula are elongated  Eyes:  Perrla, eomi, no scleral icterus  Neck:  No JVD, no TMG  Cardiovascular:  Normal rate, regular rhythm, no rubs or gallops.  No  murmurs        Intact distal pulses  Pulmonary :  Normal breath sounds, no stridor or respiratory distress   No rales, rhonchi, or wheezing  Abdominal:  Soft, nondistended, bowel sounds present.  No tenderness noted.   Musculoskeletal:  minimal lower extremity edema noted.  Lymph Nodes:  No cervical lymphadenopathy noted  Skin:  No cyanosis noted  Neurologic:  Alert, appropriate, moves all 4 extremities without obvious deficit.         Assessment & Plan:

## 2013-11-07 NOTE — Assessment & Plan Note (Signed)
The patient has moderate to severe obstructive sleep apnea diagnosed in 2007, and has been on C Pap since that time. She is now having issues with mask fit and leak, and has noticed a return of her symptoms associated with untreated sleep apnea. She also feels that her machine may not be working properly. It is 69 years old, and the patient obviously needs a new device as well as new supplies. I will get her referred back to her homecare company, and I have also encouraged her to work aggressively on weight loss. I will see her back in 6 months to check on her progress, and if doing well, will extend to yearly

## 2013-11-07 NOTE — Patient Instructions (Signed)
Will arrange for a new cpap machine and mask.  Please call if you are having issues with this.  Work on weight loss. followup with me again in 78mos.

## 2013-11-07 NOTE — Telephone Encounter (Signed)
Done hardcopy to robin  

## 2013-11-08 NOTE — Telephone Encounter (Signed)
Faxed hardcopy for Tramadol to [facility]

## 2013-11-17 ENCOUNTER — Ambulatory Visit: Payer: Medicare Other

## 2013-11-17 ENCOUNTER — Ambulatory Visit (INDEPENDENT_AMBULATORY_CARE_PROVIDER_SITE_OTHER): Payer: Medicare Other

## 2013-11-17 DIAGNOSIS — Z23 Encounter for immunization: Secondary | ICD-10-CM

## 2013-11-22 ENCOUNTER — Encounter: Payer: Self-pay | Admitting: Internal Medicine

## 2013-11-23 ENCOUNTER — Other Ambulatory Visit: Payer: Self-pay | Admitting: Specialist

## 2013-11-23 DIAGNOSIS — M542 Cervicalgia: Secondary | ICD-10-CM

## 2013-12-03 ENCOUNTER — Ambulatory Visit
Admission: RE | Admit: 2013-12-03 | Discharge: 2013-12-03 | Disposition: A | Discharge status: Home / Self Care | Payer: Medicare Other | Source: Ambulatory Visit | Attending: Specialist | Admitting: Specialist

## 2013-12-03 DIAGNOSIS — M542 Cervicalgia: Secondary | ICD-10-CM

## 2014-01-03 ENCOUNTER — Ambulatory Visit: Payer: Medicare Other | Attending: Specialist | Admitting: Physical Therapy

## 2014-01-03 DIAGNOSIS — M47812 Spondylosis without myelopathy or radiculopathy, cervical region: Secondary | ICD-10-CM | POA: Diagnosis present

## 2014-01-03 DIAGNOSIS — M25512 Pain in left shoulder: Secondary | ICD-10-CM | POA: Diagnosis not present

## 2014-01-03 NOTE — Patient Instructions (Signed)
Patient had been doing chin tucks, asked her to continue with those and pay attention to posture.

## 2014-01-03 NOTE — Therapy (Signed)
[place], [place], [address] Phone: [phone]   Fax:  [phone]  Physical Therapy Treatment  Patient Details  Name: Laura [name] MRN: [id number] Date of Birth: 1945-12-13  Encounter Date: 01/03/2014      PT End of Session - 01/03/14 1237    Visit Number 1   Number of Visits 16   Date for PT Re-Evaluation 02/28/14   PT Start Time 1150   PT Stop Time 1244   PT Time Calculation (min) 54 min   Activity Tolerance Patient tolerated treatment well  Pain increased towards end of traction, applied ice pack      Past Medical History  Diagnosis Date  . Diabetes mellitus   . Hyperlipidemia   . Hypertension   . Anxiety   . Reflux   . Vertigo   . Arthritis     back of neck, bones spurs on neck  . Sleep apnea     wears CPAP occasionally  . Cervical disc disease   . Neuropathy     Past Surgical History  Procedure Laterality Date  . Bladder surgery    . Cholecystectomy    . Partial hysterectomy    . Fibroids removed      breast (both breasts)  . Breast surgery      There were no vitals taken for this visit.  Visit Diagnosis:  Cervical spondylosis without myelopathy - Plan: PT plan of care cert/re-cert  Pain in joint, shoulder region, left - Plan: PT plan of care cert/re-cert      Subjective Assessment - 01/03/14 1155    Symptoms Pt. lifted a case of water Feb 2015.  She recovered well from initial problem with PT a couple of years ago.  She reports return of symptoms and further disability.    Pertinent History multilevel spondylosis   How long can you sit comfortably? >1 hour   How long can you stand comfortably? no diff   How long can you walk comfortably? no diff   Diagnostic tests MRI 2 weeks ago, XR in June, NCV   Patient Stated Goals to be delivered from this pain!   Currently in Pain? Yes   Pain Score 6    Pain Location Neck   Pain Orientation Posterior;Left;Right   Pain  Descriptors / Indicators Tingling;Aching;Stabbing   Pain Type Chronic pain   Pain Radiating Towards shoulder , upper arm, arms   Pain Onset More than a month ago   Pain Frequency Constant  bearable during the day   Aggravating Factors  using arms, turning head, lying down   Pain Relieving Factors ice pack   Effect of Pain on Daily Activities pt becoming frustrated, emotional   Multiple Pain Sites No          [facility] PT Assessment - 01/03/14 1205    Assessment   Medical Diagnosis --  multilevel spondylosis Rt C3-C4, C4-5 and bi C5-6, C6-7   Onset Date 01/04/12   Next MD Visit --  1 month   Prior Therapy --  yes   Balance Screen   Has the patient fallen in the past 6 months No   Has the patient had a decrease in activity level because of a fear of falling?  Yes  vertigo   Is the patient reluctant to leave their home because of a fear of falling?  No   Home Environment   Living Enviornment Private residence   Additional Comments recently moved, divorced  Prior Function   Level of Independence Independent with basic ADLs   AROM   Cervical Flexion 45   Cervical Extension 40   Cervical - Right Side Bend 25% limited   Cervical - Left Side Bend 40% limited   Cervical - Right Rotation 50   Cervical - Left Rotation 55   PROM   Overall PROM  Deficits  Rt.ER/IR decreased more than Lt. UE. Painful   Strength   Right Shoulder Flexion 3/5   Right Shoulder ABduction 3+/5   Left Shoulder Flexion 3/5   Left Shoulder ABduction 3+/5      Palpation in sitting, TTP post cervicals Lt>Rt and medial LT. Scapular border.            [facility] Adult PT Treatment/Exercise - 01/03/14 1233    Cryotherapy   Number Minutes Cryotherapy 10 Minutes   Cryotherapy Location Shoulder   Type of Cryotherapy Ice pack   Traction   Type of Traction Cervical   Min (lbs) 4   Max (lbs) 12   Hold Time 60   Rest Time 15   Time 15                PT Education - 01/03/14 1235    Education  provided Yes   Education Details PT/POC, traction   Person(s) Educated Patient   Methods Explanation;Demonstration   Comprehension Verbalized understanding;Returned demonstration          PT Short Term Goals - 01/03/14 1253    PT SHORT TERM GOAL #1   Title Pt will be I with HEP   Time 3   Period Weeks   Status New   PT SHORT TERM GOAL #2   Title Pt will report 25% improvement in LT shoulder sx (sensory and radicular)   Time 3   Period Weeks   Status New   PT SHORT TERM GOAL #3   Title Pt. will sit with corrected posture and maintain for 5-10 min for ex.    Time 3   Period Weeks   Status New           PT Long Term Goals - 01/03/14 1256    PT LONG TERM GOAL #1   Title Pt. will be I with advanced HEP for posture, C-spine   Time 6   Period Weeks   Status New   PT LONG TERM GOAL #2   Title Pt will demo safe lifting techniques and good body mechanics in clinic to reduce re-injury.    Time 6   Period Weeks   Status New   PT LONG TERM GOAL #3   Title Pt. will put away dishes, light items overhead without pain increase in neck.    Time 6   Period Weeks   Status New   PT LONG TERM GOAL #4   Title Pt. will report min sleep disturbance due to pain in supine.    Time 6   Period Weeks   Status New               Plan - 01/03/14 1237    Clinical Impression Statement Patient presents with symptoms consistent with cervical spondylosis. She will benefit from skilled PT closer to her home to restore use of UEs and comfort with sleeping.    Pt will benefit from skilled therapeutic intervention in order to improve on the following deficits Impaired flexibility;Pain;Impaired sensation;Decreased mobility;Decreased activity tolerance;Decreased range of motion;Decreased strength;Postural dysfunction;Impaired UE functional use   Rehab Potential Good  PT Frequency 2x / week   PT Duration 8 weeks   PT Treatment/Interventions ADLs/Self Care Home Management;Electrical  Stimulation;Therapeutic exercise;Patient/family education;Moist Heat;Traction;Functional mobility training;Neuromuscular re-education;Manual techniques;Passive range of motion;Ultrasound;Cryotherapy;Therapeutic activities   PT Next Visit Plan give HEP (stabilization), assess traction   PT Home Exercise Plan cont chin tucks   Consulted and Agree with Plan of Care Patient          G-Codes - January 12, 2014 1246    Functional Assessment Tool Used clinical judgement   Functional Limitation Carrying, moving and handling objects   Carrying, Moving and Handling Objects Current Status (U7654) At least 40 percent but less than 60 percent impaired, limited or restricted   Carrying, Moving and Handling Objects Goal Status (Y5035) At least 20 percent but less than 40 percent impaired, limited or restricted      Problem List Patient Active Problem List   Diagnosis Date Noted  . Neuropathy   . Rash and nonspecific skin eruption 08/30/2013  . Paresthesia of both feet 08/30/2013  . Blurred vision, bilateral 02/15/2013  . Eye pain 02/15/2013  . Sinusitis, chronic 10/07/2012  . Left shoulder pain 04/15/2012  . Preventative health care 10/22/2011  . Cervical disc disease 10/22/2011  . Bilateral hand pain 08/05/2011  . Reflux 06/29/2011  . Bloating 06/29/2011  . Back pain 03/13/2011  . Vertigo 01-13-11  . Nystagmus 2011/01/13  . Insomnia 10/21/2010  . CONSTIPATION 08/17/2009  . VITAMIN D DEFICIENCY 05/15/2009  . DYSPNEA 03/13/2009  . Essential hypertension, benign 01/30/2009  . DEPRESSION 01/16/2009  . Irritable bowel syndrome 01/16/2009  . OSTEOARTHRITIS 12/15/2008  . DIABETES MELLITUS, TYPE II 11/03/2008  . HYPERLIPIDEMIA 11/03/2008  . ANXIETY 11/03/2008  . OVERACTIVE BLADDER 11/03/2008  . OSA (obstructive sleep apnea) 11/03/2008  . MURMUR 11/03/2008    PAA,JENNIFER 2014-01-12, 1:07 PM  [facility] [address] [place], [place],  [address] Phone: [phone]   Fax:  [phone]  Raeford Razor, PT 2014-01-12 1:09 PM Phone: [phone] Fax: [phone]

## 2014-01-05 ENCOUNTER — Ambulatory Visit: Payer: Medicare Other | Admitting: Physical Therapy

## 2014-01-05 ENCOUNTER — Ambulatory Visit: Payer: Self-pay | Admitting: Physical Therapy

## 2014-01-10 ENCOUNTER — Ambulatory Visit: Payer: Medicare Other | Attending: Specialist | Admitting: Physical Therapy

## 2014-01-10 DIAGNOSIS — M25512 Pain in left shoulder: Secondary | ICD-10-CM | POA: Diagnosis not present

## 2014-01-10 DIAGNOSIS — M47812 Spondylosis without myelopathy or radiculopathy, cervical region: Secondary | ICD-10-CM | POA: Insufficient documentation

## 2014-01-13 DIAGNOSIS — G4733 Obstructive sleep apnea (adult) (pediatric): Secondary | ICD-10-CM | POA: Diagnosis not present

## 2014-01-17 ENCOUNTER — Ambulatory Visit: Payer: Medicare Other | Attending: Specialist | Admitting: *Deleted

## 2014-01-17 DIAGNOSIS — M25512 Pain in left shoulder: Secondary | ICD-10-CM | POA: Diagnosis not present

## 2014-01-17 DIAGNOSIS — M47812 Spondylosis without myelopathy or radiculopathy, cervical region: Secondary | ICD-10-CM | POA: Diagnosis not present

## 2014-01-19 ENCOUNTER — Encounter: Payer: Self-pay | Admitting: *Deleted

## 2014-01-20 ENCOUNTER — Ambulatory Visit: Payer: Medicare Other | Admitting: Physical Therapy

## 2014-01-20 DIAGNOSIS — M25512 Pain in left shoulder: Secondary | ICD-10-CM | POA: Diagnosis not present

## 2014-01-20 DIAGNOSIS — M47812 Spondylosis without myelopathy or radiculopathy, cervical region: Secondary | ICD-10-CM | POA: Diagnosis not present

## 2014-01-24 ENCOUNTER — Ambulatory Visit: Payer: Medicare Other | Admitting: *Deleted

## 2014-01-24 DIAGNOSIS — M25512 Pain in left shoulder: Secondary | ICD-10-CM | POA: Diagnosis not present

## 2014-01-24 DIAGNOSIS — M47812 Spondylosis without myelopathy or radiculopathy, cervical region: Secondary | ICD-10-CM | POA: Diagnosis not present

## 2014-01-27 ENCOUNTER — Ambulatory Visit: Payer: Medicare Other | Admitting: Physical Therapy

## 2014-01-27 DIAGNOSIS — M47812 Spondylosis without myelopathy or radiculopathy, cervical region: Secondary | ICD-10-CM | POA: Diagnosis not present

## 2014-01-27 DIAGNOSIS — M25512 Pain in left shoulder: Secondary | ICD-10-CM | POA: Diagnosis not present

## 2014-01-30 ENCOUNTER — Ambulatory Visit (INDEPENDENT_AMBULATORY_CARE_PROVIDER_SITE_OTHER): Payer: Medicare Other | Admitting: Pulmonary Disease

## 2014-01-30 ENCOUNTER — Encounter: Payer: Self-pay | Admitting: Pulmonary Disease

## 2014-01-30 VITALS — BP 130/74 | HR 79 | Temp 97.0°F | Ht 64.0 in | Wt 204.4 lb

## 2014-01-30 DIAGNOSIS — G4733 Obstructive sleep apnea (adult) (pediatric): Secondary | ICD-10-CM | POA: Diagnosis not present

## 2014-01-30 NOTE — Patient Instructions (Signed)
Will have your home care company work with you on mask fit. Your cpap pressure appears to be set just right. Keep up with mask cushion changes and supplies. 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

## 2014-01-30 NOTE — Progress Notes (Signed)
   Subjective:    Patient ID: Laura [name], female    DOB: [birth year], 69 y.o.   MRN: [id number]  HPI The patient comes in today for follow-up of her known obstructive sleep apnea. She has been wearing C Pap very compliantly by her download, and has excellent control of her AHI. She is having some mask leak issues, and the patient feels that her mask is not fitting properly. She initially slept very well with the device, but now she is being awakened by chronic pain. She feels that her alertness during the day has definitely improved.   Review of Systems  Constitutional: Negative for fever and unexpected weight change.  HENT: Negative for congestion, dental problem, ear pain, nosebleeds, postnasal drip, rhinorrhea, sinus pressure, sneezing, sore throat and trouble swallowing.   Eyes: Negative for redness and itching.  Respiratory: Negative for cough, chest tightness, shortness of breath and wheezing.   Cardiovascular: Negative for palpitations and leg swelling.  Gastrointestinal: Negative for nausea and vomiting.  Genitourinary: Negative for dysuria.  Musculoskeletal: Negative for joint swelling.  Skin: Negative for rash.  Neurological: Negative for headaches.  Hematological: Does not bruise/bleed easily.  Psychiatric/Behavioral: Negative for dysphoric mood. The patient is not nervous/anxious.        Objective:   Physical Exam Obese female in no acute distress Nose without purulence or discharge noted Neck without lymphadenopathy or thyromegaly No skin breakdown or pressure necrosis from the C Pap mask Lower extremities with mild edema, no cyanosis Alert and oriented, moves all 4 extremities.       Assessment & Plan:

## 2014-01-30 NOTE — Assessment & Plan Note (Signed)
The patient has been wearing C Pap very compliantly by her download, and feels the machine its doing its job. Her only complaint is that of significant mask leak, and I will have her home care company work with her on a better fitting mask. She also feels that her sleep as being disrupted primarily because of chronic pain, and not from her sleep apnea. I have asked her to work on mask fit, and also work aggressively on weight loss. I'll see her back in 6 months if she is doing well.

## 2014-01-31 ENCOUNTER — Ambulatory Visit: Payer: Medicare Other | Admitting: Physical Therapy

## 2014-01-31 DIAGNOSIS — M25512 Pain in left shoulder: Secondary | ICD-10-CM | POA: Diagnosis not present

## 2014-01-31 DIAGNOSIS — M47812 Spondylosis without myelopathy or radiculopathy, cervical region: Secondary | ICD-10-CM | POA: Diagnosis not present

## 2014-02-06 ENCOUNTER — Ambulatory Visit: Payer: Medicare Other | Admitting: Physical Therapy

## 2014-02-06 DIAGNOSIS — M25512 Pain in left shoulder: Secondary | ICD-10-CM | POA: Diagnosis not present

## 2014-02-06 DIAGNOSIS — M47812 Spondylosis without myelopathy or radiculopathy, cervical region: Secondary | ICD-10-CM | POA: Diagnosis not present

## 2014-02-09 ENCOUNTER — Telehealth: Payer: Self-pay | Admitting: Internal Medicine

## 2014-02-09 ENCOUNTER — Other Ambulatory Visit: Payer: Self-pay | Admitting: Internal Medicine

## 2014-02-09 DIAGNOSIS — M4722 Other spondylosis with radiculopathy, cervical region: Secondary | ICD-10-CM | POA: Diagnosis not present

## 2014-02-09 DIAGNOSIS — G4733 Obstructive sleep apnea (adult) (pediatric): Secondary | ICD-10-CM | POA: Diagnosis not present

## 2014-02-09 MED ORDER — GLUCOSE BLOOD VI STRP: 1.0000 | ORAL_STRIP | Freq: Every day | Status: DC

## 2014-02-09 NOTE — Telephone Encounter (Signed)
Resent strips to [facility]...Laura [name]

## 2014-02-09 NOTE — Telephone Encounter (Signed)
Pharmacist calling to request a refill of one touch test strips 50 ct. The patient came to the pharmacy thinking that she had refills

## 2014-02-10 ENCOUNTER — Ambulatory Visit: Payer: Medicare Other | Admitting: Physical Therapy

## 2014-02-10 DIAGNOSIS — M47812 Spondylosis without myelopathy or radiculopathy, cervical region: Secondary | ICD-10-CM | POA: Diagnosis not present

## 2014-02-10 DIAGNOSIS — M25512 Pain in left shoulder: Secondary | ICD-10-CM | POA: Diagnosis not present

## 2014-02-13 DIAGNOSIS — G4733 Obstructive sleep apnea (adult) (pediatric): Secondary | ICD-10-CM | POA: Diagnosis not present

## 2014-02-14 ENCOUNTER — Ambulatory Visit: Payer: Medicare Other | Attending: Specialist | Admitting: Physical Therapy

## 2014-02-14 DIAGNOSIS — M25512 Pain in left shoulder: Secondary | ICD-10-CM | POA: Insufficient documentation

## 2014-02-14 DIAGNOSIS — M47812 Spondylosis without myelopathy or radiculopathy, cervical region: Secondary | ICD-10-CM | POA: Diagnosis not present

## 2014-02-17 ENCOUNTER — Ambulatory Visit: Payer: Medicare Other | Admitting: *Deleted

## 2014-02-17 DIAGNOSIS — M47812 Spondylosis without myelopathy or radiculopathy, cervical region: Secondary | ICD-10-CM | POA: Diagnosis not present

## 2014-02-17 DIAGNOSIS — M25512 Pain in left shoulder: Secondary | ICD-10-CM | POA: Diagnosis not present

## 2014-03-08 ENCOUNTER — Ambulatory Visit: Payer: Medicare Other | Admitting: *Deleted

## 2014-03-08 ENCOUNTER — Encounter: Payer: Self-pay | Admitting: *Deleted

## 2014-03-08 DIAGNOSIS — M25512 Pain in left shoulder: Secondary | ICD-10-CM | POA: Diagnosis not present

## 2014-03-08 DIAGNOSIS — M47812 Spondylosis without myelopathy or radiculopathy, cervical region: Secondary | ICD-10-CM

## 2014-03-08 DIAGNOSIS — G4733 Obstructive sleep apnea (adult) (pediatric): Secondary | ICD-10-CM | POA: Diagnosis not present

## 2014-03-08 NOTE — Therapy (Signed)
[facility], [place], [address] Phone: [phone]   Fax:  [phone]  Physical Therapy Treatment  Patient Details  Name: Laura [name] MRN: [id number] Date of Birth: 1945-09-18 Referring Provider:  Biagio Borg, MD  Encounter Date: 03/08/2014    Past Medical History  Diagnosis Date  . Diabetes mellitus   . Hyperlipidemia   . Hypertension   . Anxiety   . Reflux   . Vertigo   . Arthritis     back of neck, bones spurs on neck  . Sleep apnea     wears CPAP occasionally  . Cervical disc disease   . Neuropathy     Past Surgical History  Procedure Laterality Date  . Bladder surgery    . Cholecystectomy    . Partial hysterectomy    . Fibroids removed      breast (both breasts)  . Breast surgery      There were no vitals taken for this visit.  Visit Diagnosis:  Cervical spondylosis without myelopathy      Subjective Assessment - 03/08/14 1151    Symptoms Neck is feeling better. pain is less now and would like to cont. PT   Limitations Lifting   Currently in Pain? Yes   Pain Score 3    Pain Location Neck   Pain Orientation Left   Pain Descriptors / Indicators Aching;Dull;Jabbing                    [facility] Adult PT Treatment/Exercise - 03/08/14 0001    Modalities   Modalities Electrical Stimulation   Electrical Stimulation   Electrical Stimulation Location LT trap   Electrical Stimulation Parameters Premod x15 mins   Electrical Stimulation Goals Pain   Traction   Type of Traction Cervical   Min (lbs) 5   Max (lbs) 13   Hold Time 99   Rest Time 5   Time 15   Manual Therapy   Manual Therapy Massage   Massage TPR to LT u-trap and                   PT Short Term Goals - 01/03/14 1253    PT SHORT TERM GOAL #1   Title Pt will be I with HEP   Time 3   Period Weeks   Status New   PT SHORT TERM GOAL #2   Title Pt will report 25% improvement in LT shoulder sx (sensory  and radicular)   Time 3   Period Weeks   Status New   PT SHORT TERM GOAL #3   Title Pt. will sit with corrected posture and maintain for 5-10 min for ex.    Time 3   Period Weeks   Status New    #1  Met 01-30-14 , #2  Met 02-06-14  65% better,  #3  Met  03-08-14       PT Long Term Goals - 01/03/14 1256    PT LONG TERM GOAL #1   Title Pt. will be I with advanced HEP for posture, C-spine   Time 6   Period Weeks   Status New   PT LONG TERM GOAL #2   Title Pt will demo safe lifting techniques and good body mechanics in clinic to reduce re-injury.    Time 6   Period Weeks   Status New   PT LONG TERM GOAL #3   Title Pt. will put away dishes, light items overhead  without pain increase in neck.    Time 6   Period Weeks   Status New   PT LONG TERM GOAL #4   Title Pt. will report min sleep disturbance due to pain in supine.    Time 6   Period Weeks   Status New    #1 NM  Ongoing,  #2  MET 03-08-14,   #3 NM  Due to 3/10 pain,   met, #4  NM  Due to 3/10 pain           Problem List Patient Active Problem List   Diagnosis Date Noted  . Neuropathy   . Rash and nonspecific skin eruption 08/30/2013  . Paresthesia of both feet 08/30/2013  . Blurred vision, bilateral 02/15/2013  . Eye pain 02/15/2013  . Sinusitis, chronic 10/07/2012  . Left shoulder pain 04/15/2012  . Preventative health care 10/22/2011  . Cervical disc disease 10/22/2011  . Bilateral hand pain 08/05/2011  . Reflux 06/29/2011  . Bloating 06/29/2011  . Back pain 03/13/2011  . Vertigo 01/04/2011  . Nystagmus 01/04/2011  . Insomnia 10/21/2010  . CONSTIPATION 08/17/2009  . VITAMIN D DEFICIENCY 05/15/2009  . DYSPNEA 03/13/2009  . Essential hypertension, benign 01/30/2009  . DEPRESSION 01/16/2009  . Irritable bowel syndrome 01/16/2009  . OSTEOARTHRITIS 12/15/2008  . DIABETES MELLITUS, TYPE II 11/03/2008  . HYPERLIPIDEMIA 11/03/2008  . ANXIETY 11/03/2008  . OVERACTIVE BLADDER 11/03/2008  . OSA  (obstructive sleep apnea) 11/03/2008  . MURMUR 11/03/2008    APPLEGATE, [place] PTA 03/08/2014, 12:51 PM  [facility] [address] [place], [place], [address] Phone: [phone]   Fax:  [phone]     .

## 2014-03-09 DIAGNOSIS — M19012 Primary osteoarthritis, left shoulder: Secondary | ICD-10-CM | POA: Diagnosis not present

## 2014-03-09 DIAGNOSIS — M7542 Impingement syndrome of left shoulder: Secondary | ICD-10-CM | POA: Diagnosis not present

## 2014-03-13 DIAGNOSIS — G4733 Obstructive sleep apnea (adult) (pediatric): Secondary | ICD-10-CM | POA: Diagnosis not present

## 2014-03-14 DIAGNOSIS — G4733 Obstructive sleep apnea (adult) (pediatric): Secondary | ICD-10-CM | POA: Diagnosis not present

## 2014-03-15 ENCOUNTER — Ambulatory Visit (INDEPENDENT_AMBULATORY_CARE_PROVIDER_SITE_OTHER): Payer: Medicare Other | Admitting: Family

## 2014-03-15 ENCOUNTER — Encounter: Payer: Self-pay | Admitting: Family

## 2014-03-15 VITALS — BP 178/92 | HR 83 | Temp 98.2°F | Resp 18 | Ht 64.0 in | Wt 204.0 lb

## 2014-03-15 DIAGNOSIS — J019 Acute sinusitis, unspecified: Secondary | ICD-10-CM | POA: Diagnosis not present

## 2014-03-15 MED ORDER — AMOXICILLIN-POT CLAVULANATE 875-125 MG PO TABS: 1.0000 | ORAL_TABLET | Freq: Two times a day (BID) | ORAL | Status: DC

## 2014-03-15 MED ORDER — FLUCONAZOLE 150 MG PO TABS: 150.0000 mg | ORAL_TABLET | Freq: Once | ORAL | Status: DC

## 2014-03-15 NOTE — Assessment & Plan Note (Signed)
Symptoms and exam consistent with bacterial sinusitis. Start Augmentin. Start Diflucan as needed for candidiasis. Continue over-the-counter medications as needed for symptom relief. Patient advised to stay away from [place] and caffeine-containing products. Follow-up if symptoms worsen or fail to improve.

## 2014-03-15 NOTE — Patient Instructions (Signed)
Thank you for choosing  [facility].  Summary/Instructions:  Your prescription(s) have been submitted to your pharmacy or been printed and provided for you. Please take as directed and contact our office if you believe you are having problem(s) with the medication(s) or have any questions.  If your symptoms worsen or fail to improve, please contact our office for further instruction, or in case of emergency go directly to the emergency room at the closest medical facility.   General Recommendations:    Please drink plenty of fluids.  Get plenty of rest   Sleep in humidified air  Use saline nasal sprays  Netti pot   OTC Medications:  Decongestants - helps relieve congestion   Flonase (generic fluticasone) or Nasacort (generic triamcinolone) - please make sure to use the "cross-over" technique at a 45 degree angle towards the opposite eye as opposed to straight up the nasal passageway.   If you have HIGH BLOOD PRESSURE - Coricidin HBP; AVOID any product that is -D as this contains pseudoephedrine which may increase your blood pressure.  Afrin (oxymetazoline) every 6-8 hours for up to 3 days.   Allergies - helps relieve runny nose, itchy eyes and sneezing   Claritin (generic loratidine), Allegra (fexofenidine), or Zyrtec (generic cyrterizine) for runny nose. These medications should not cause drowsiness.  Note - Benadryl (generic diphenhydramine) may be used however may cause drowsiness  Cough -   Delsym or Robitussin (generic dextromethorphan)  Expectorants - helps loosen mucus to ease removal   Mucinex (generic guaifenesin) as directed on the package.  Headaches / General Aches   Tylenol (generic acetaminophen) - DO NOT EXCEED 3 grams (3,000 mg) in a 24 hour time period  Advil/Motrin (generic ibuprofen)   Sore Throat -   Salt water gargle   Chloraseptic (generic benzocaine) spray or lozenges / Sucrets (generic dyclonine)      Sinusitis Sinusitis  is redness, soreness, and inflammation of the paranasal sinuses. Paranasal sinuses are air pockets within the bones of your face (beneath the eyes, the middle of the forehead, or above the eyes). In healthy paranasal sinuses, mucus is able to drain out, and air is able to circulate through them by way of your nose. However, when your paranasal sinuses are inflamed, mucus and air can become trapped. This can allow bacteria and other germs to grow and cause infection. Sinusitis can develop quickly and last only a short time (acute) or continue over a long period (chronic). Sinusitis that lasts for more than 12 weeks is considered chronic.  CAUSES  Causes of sinusitis include: 3. Allergies. 4. Structural abnormalities, such as displacement of the cartilage that separates your nostrils (deviated septum), which can decrease the air flow through your nose and sinuses and affect sinus drainage. 5. Functional abnormalities, such as when the small hairs (cilia) that line your sinuses and help remove mucus do not work properly or are not present. SIGNS AND SYMPTOMS  Symptoms of acute and chronic sinusitis are the same. The primary symptoms are pain and pressure around the affected sinuses. Other symptoms include: 2. Upper toothache. 3. Earache. 4. Headache. 5. Bad breath. 6. Decreased sense of smell and taste. 7. A cough, which worsens when you are lying flat. 8. Fatigue. 9. Fever. 10. Thick drainage from your nose, which often is green and may contain pus (purulent). 11. Swelling and warmth over the affected sinuses. DIAGNOSIS  Your health care provider will perform a physical exam. During the exam, your health care provider may: 2. Look   in your nose for signs of abnormal growths in your nostrils (nasal polyps). 3. Tap over the affected sinus to check for signs of infection. 4. View the inside of your sinuses (endoscopy) using an imaging device that has a light attached (endoscope). If your health  care provider suspects that you have chronic sinusitis, one or more of the following tests may be recommended: 3. Allergy tests. 4. Nasal culture. A sample of mucus is taken from your nose, sent to a lab, and screened for bacteria. 5. Nasal cytology. A sample of mucus is taken from your nose and examined by your health care provider to determine if your sinusitis is related to an allergy. TREATMENT  Most cases of acute sinusitis are related to a viral infection and will resolve on their own within 10 days. Sometimes medicines are prescribed to help relieve symptoms (pain medicine, decongestants, nasal steroid sprays, or saline sprays).  However, for sinusitis related to a bacterial infection, your health care provider will prescribe antibiotic medicines. These are medicines that will help kill the bacteria causing the infection.  Rarely, sinusitis is caused by a fungal infection. In theses cases, your health care provider will prescribe antifungal medicine. For some cases of chronic sinusitis, surgery is needed. Generally, these are cases in which sinusitis recurs more than 3 times per year, despite other treatments. HOME CARE INSTRUCTIONS  3. Drink plenty of water. Water helps thin the mucus so your sinuses can drain more easily. 4. Use a humidifier. 5. Inhale steam 3 to 4 times a day (for example, sit in the bathroom with the shower running). 6. Apply a warm, moist washcloth to your face 3 to 4 times a day, or as directed by your health care provider. 7. Use saline nasal sprays to help moisten and clean your sinuses. 8. Take medicines only as directed by your health care provider. 9. If you were prescribed either an antibiotic or antifungal medicine, finish it all even if you start to feel better. SEEK IMMEDIATE MEDICAL CARE IF: 6. You have increasing pain or severe headaches. 7. You have nausea, vomiting, or drowsiness. 8. You have swelling around your face. 9. You have vision  problems. 10. You have a stiff neck. 11. You have difficulty breathing. MAKE SURE YOU:   Understand these instructions.  Will watch your condition.  Will get help right away if you are not doing well or get worse. Document Released: 12/30/2004 Document Revised: 05/16/2013 Document Reviewed: 01/14/2011 Exit[employer] Patient Information 2015 [employer]. This information is not intended to replace advice given to you by your health care provider. Make sure you discuss any questions you have with your health care provider.  

## 2014-03-15 NOTE — Progress Notes (Signed)
Subjective:    Patient ID: Laura [name], female    DOB: [birth year], 69 y.o.   MRN: [id number]  Chief Complaint  Patient presents with  . Dizziness    Congestion, productive cough at times, drainage, sinus pressure, and chills, x2 weeks, dizziness not like veritogo x3 days,    HPI:  Laura [name] is a 69 y.o. female who presents today for an acute visit.  This is a new problem. Associated symptoms of productive cough, drainage, sinus pressure and chills have been going on for about 2 weeks. Notes new onset dizziness for about 3 days.    Allergies  Allergen Reactions  . Sulfa Antibiotics     Tongue swells, hives, itching  . Codeine     itch  . Crestor [Rosuvastatin Calcium] Other (See Comments)    Did something to memory   . Hydrocodone-Homatropine   . Lipitor [Atorvastatin Calcium] Other (See Comments)    Makes weak   . Prednisone     Nervous      Current Outpatient Prescriptions on File Prior to Visit  Medication Sig Dispense Refill  . fluticasone (FLONASE) 50 MCG/ACT nasal spray PLACE TWO (2) SPRAYS INTO EACH NOSTRIL DAILY 16 g 11  . glimepiride (AMARYL) 1 MG tablet Take 0.5 tablets (0.5 mg total) by mouth 2 (two) times daily. 90 tablet 3  . glucose blood test strip 1 each by Other route daily. Use to check blood sugars daily Dx E11.9 50 each 11  . Lancets (ONETOUCH ULTRASOFT) lancets USE FOR CHECKING BLOOD SUGARS TWICE DAILY 100 each 3  . loperamide (IMODIUM A-D) 2 MG tablet Take 1-2 mg by mouth 4 (four) times daily as needed for diarrhea or loose stools.    Marland Kitchen LORazepam (ATIVAN) 0.5 MG tablet Take 1 tablet (0.5 mg total) by mouth daily as needed for anxiety. 30 tablet 2  . omeprazole (PRILOSEC) 20 MG capsule TAKE ONE CAPSULE BY MOUTH DAILY 90 capsule 1  . simethicone (GAS-X) 80 MG chewable tablet Chew 80 mg by mouth 4 (four) times daily as needed.     . traMADol (ULTRAM) 50 MG tablet TAKE ONE TABLET BY MOUTH EVERY NIGHT AT BEDTIME AS NEEDED 30 tablet 2  .  valsartan (DIOVAN) 160 MG tablet TAKE TWO (2) TABLETS BY MOUTH DAILY 90 tablet 3   No current facility-administered medications on file prior to visit.    Review of Systems  Constitutional: Positive for chills. Negative for fever.  HENT: Positive for congestion, ear pain, sinus pressure and sneezing. Negative for sore throat.   Respiratory: Positive for cough. Negative for chest tightness and shortness of breath.   Neurological: Positive for dizziness and weakness.      Objective:    BP 178/92 mmHg  Pulse 83  Temp(Src) 98.2 F (36.8 C) (Oral)  Resp 18  Ht 5\' 4"  (1.626 m)  Wt 204 lb (92.534 kg)  BMI 35.00 kg/m2  SpO2 98% Nursing note and vital signs reviewed.  Physical Exam  Constitutional: She is oriented to person, place, and time. She appears well-developed and well-nourished. No distress.  HENT:  Right Ear: Hearing, tympanic membrane, external ear and ear canal normal.  Left Ear: Hearing, tympanic membrane, external ear and ear canal normal.  Nose: Right sinus exhibits maxillary sinus tenderness and frontal sinus tenderness. Left sinus exhibits maxillary sinus tenderness and frontal sinus tenderness.  Mouth/Throat: Uvula is midline, oropharynx is clear and moist and mucous membranes are normal.  Cardiovascular: Normal rate, regular rhythm, normal  heart sounds and intact distal pulses.   Pulmonary/Chest: Effort normal and breath sounds normal.  Neurological: She is alert and oriented to person, place, and time.  Skin: Skin is warm and dry.  Psychiatric: She has a normal mood and affect. Her behavior is normal. Judgment and thought content normal.       Assessment & Plan:

## 2014-03-15 NOTE — Progress Notes (Signed)
Pre visit review using our clinic review tool, if applicable. No additional management support is needed unless otherwise documented below in the visit note. 

## 2014-04-12 ENCOUNTER — Ambulatory Visit (INDEPENDENT_AMBULATORY_CARE_PROVIDER_SITE_OTHER)
Admission: RE | Admit: 2014-04-12 | Discharge: 2014-04-12 | Disposition: A | Discharge status: Home / Self Care | Payer: Medicare Other | Source: Ambulatory Visit | Attending: Internal Medicine | Admitting: Internal Medicine

## 2014-04-12 ENCOUNTER — Other Ambulatory Visit (INDEPENDENT_AMBULATORY_CARE_PROVIDER_SITE_OTHER): Payer: Medicare Other

## 2014-04-12 ENCOUNTER — Encounter: Payer: Self-pay | Admitting: Internal Medicine

## 2014-04-12 ENCOUNTER — Ambulatory Visit (INDEPENDENT_AMBULATORY_CARE_PROVIDER_SITE_OTHER): Payer: Medicare Other | Admitting: Internal Medicine

## 2014-04-12 VITALS — BP 138/80 | HR 77 | Temp 98.3°F | Resp 18 | Ht 64.0 in | Wt 206.1 lb

## 2014-04-12 DIAGNOSIS — R42 Dizziness and giddiness: Secondary | ICD-10-CM | POA: Diagnosis not present

## 2014-04-12 DIAGNOSIS — J329 Chronic sinusitis, unspecified: Secondary | ICD-10-CM

## 2014-04-12 DIAGNOSIS — F411 Generalized anxiety disorder: Secondary | ICD-10-CM | POA: Diagnosis not present

## 2014-04-12 DIAGNOSIS — K589 Irritable bowel syndrome without diarrhea: Secondary | ICD-10-CM

## 2014-04-12 DIAGNOSIS — I10 Essential (primary) hypertension: Secondary | ICD-10-CM

## 2014-04-12 DIAGNOSIS — E1140 Type 2 diabetes mellitus with diabetic neuropathy, unspecified: Secondary | ICD-10-CM

## 2014-04-12 DIAGNOSIS — Z79899 Other long term (current) drug therapy: Secondary | ICD-10-CM | POA: Diagnosis not present

## 2014-04-12 DIAGNOSIS — R05 Cough: Secondary | ICD-10-CM | POA: Diagnosis not present

## 2014-04-12 LAB — URINALYSIS, ROUTINE W REFLEX MICROSCOPIC
Bilirubin Urine: NEGATIVE
Hgb urine dipstick: NEGATIVE
Ketones, ur: NEGATIVE
Nitrite: NEGATIVE
PH: 8 (ref 5.0–8.0)
RBC / HPF: NONE SEEN (ref 0–?)
SPECIFIC GRAVITY, URINE: 1.01 (ref 1.000–1.030)
Total Protein, Urine: NEGATIVE
Urine Glucose: NEGATIVE
Urobilinogen, UA: 0.2 (ref 0.0–1.0)

## 2014-04-12 LAB — MICROALBUMIN / CREATININE URINE RATIO
Creatinine,U: 52.6 mg/dL
MICROALB/CREAT RATIO: 1.7 mg/g (ref 0.0–30.0)
Microalb, Ur: 0.9 mg/dL (ref 0.0–1.9)

## 2014-04-12 LAB — CBC WITH DIFFERENTIAL/PLATELET
BASOS PCT: 0.3 % (ref 0.0–3.0)
Basophils Absolute: 0 10*3/uL (ref 0.0–0.1)
Eosinophils Absolute: 0.1 10*3/uL (ref 0.0–0.7)
Eosinophils Relative: 1.7 % (ref 0.0–5.0)
HEMATOCRIT: 38.3 % (ref 36.0–46.0)
Hemoglobin: 13.1 g/dL (ref 12.0–15.0)
Lymphocytes Relative: 33.1 % (ref 12.0–46.0)
Lymphs Abs: 2.4 10*3/uL (ref 0.7–4.0)
MCHC: 34.1 g/dL (ref 30.0–36.0)
MCV: 85.3 fl (ref 78.0–100.0)
Monocytes Absolute: 0.4 10*3/uL (ref 0.1–1.0)
Monocytes Relative: 5.8 % (ref 3.0–12.0)
Neutro Abs: 4.3 10*3/uL (ref 1.4–7.7)
Neutrophils Relative %: 59.1 % (ref 43.0–77.0)
PLATELETS: 266 10*3/uL (ref 150.0–400.0)
RBC: 4.5 Mil/uL (ref 3.87–5.11)
RDW: 14.4 % (ref 11.5–15.5)
WBC: 7.3 10*3/uL (ref 4.0–10.5)

## 2014-04-12 LAB — LIPID PANEL
CHOL/HDL RATIO: 5
Cholesterol: 258 mg/dL — ABNORMAL HIGH (ref 0–200)
HDL: 53.2 mg/dL (ref >39.00)
LDL CALC: 166 mg/dL — AB (ref 0–99)
NONHDL: 204.8
TRIGLYCERIDES: 196 mg/dL — AB (ref 0.0–149.0)
VLDL: 39.2 mg/dL (ref 0.0–40.0)

## 2014-04-12 LAB — HEPATIC FUNCTION PANEL
ALBUMIN: 4.3 g/dL (ref 3.5–5.2)
ALT: 15 U/L (ref 0–35)
AST: 15 U/L (ref 0–37)
Alkaline Phosphatase: 69 U/L (ref 39–117)
Bilirubin, Direct: 0 mg/dL (ref 0.0–0.3)
Total Bilirubin: 0.3 mg/dL (ref 0.2–1.2)
Total Protein: 7.4 g/dL (ref 6.0–8.3)

## 2014-04-12 LAB — TSH: TSH: 2.34 u[IU]/mL (ref 0.35–4.50)

## 2014-04-12 LAB — BASIC METABOLIC PANEL
BUN: 11 mg/dL (ref 6–23)
CO2: 29 meq/L (ref 19–32)
Calcium: 9.9 mg/dL (ref 8.4–10.5)
Chloride: 106 mEq/L (ref 96–112)
Creatinine, Ser: 0.75 mg/dL (ref 0.40–1.20)
GFR: 98.42 mL/min (ref >60.00)
Glucose, Bld: 104 mg/dL — ABNORMAL HIGH (ref 70–99)
POTASSIUM: 4.2 meq/L (ref 3.5–5.1)
SODIUM: 138 meq/L (ref 135–145)

## 2014-04-12 LAB — HEMOGLOBIN A1C: Hgb A1c MFr Bld: 7.1 % — ABNORMAL HIGH (ref 4.6–6.5)

## 2014-04-12 LAB — VITAMIN B12: Vitamin B-12: 545 pg/mL (ref 211–911)

## 2014-04-12 MED ORDER — ESCITALOPRAM OXALATE 10 MG PO TABS: 10.0000 mg | ORAL_TABLET | Freq: Every day | ORAL | Status: DC

## 2014-04-12 NOTE — Patient Instructions (Addendum)
Your EKG was OK today  Please take all new medication as prescribed - the lexapro 10 mg per day  Please continue all other medications as before, including the flonase that you already have  You can also try OTC zyrtec daily as needed for allergy symptoms as well  Please have the pharmacy call with any other refills you may need.  Please continue your efforts at being more active, low cholesterol diabetic diet, and weight control.  Please keep your appointments with your specialists as you may have planned  You will be contacted regarding the referral for: MRI head, carotids, echocardiogram  Please go to the XRAY Department in the Basement (go straight as you get off the elevator) for the x-ray testing  Please go to the LAB in the Basement (turn left off the elevator) for the tests to be done today  You will be contacted by phone if any changes need to be made immediately.  Otherwise, you will receive a letter about your results with an explanation, but please check with MyChart first.  Please remember to sign up for MyChart if you have not done so, as this will be important to you in the future with finding out test results, communicating by private email, and scheduling acute appointments online when needed.  Please return in 3 weeks, or sooner if needed

## 2014-04-12 NOTE — Assessment & Plan Note (Signed)
Ok to add lexapro 10 qd, cont rare prn lorazepam prn

## 2014-04-12 NOTE — Assessment & Plan Note (Signed)
Asked pt to start the flonase she has at home,  to f/u any worsening symptoms or concerns, ok for zyrtec prn if needed

## 2014-04-12 NOTE — Assessment & Plan Note (Signed)
stable overall by history and exam, recent data reviewed with pt, and pt to continue medical treatment as before,  to f/u any worsening symptoms or concerns BP Readings from Last 3 Encounters:  04/12/14 138/80  03/15/14 178/92  01/30/14 130/74

## 2014-04-12 NOTE — Assessment & Plan Note (Signed)
stable overall by history and exam, recent data reviewed with pt, and pt to continue medical treatment as before,  to f/u any worsening symptoms or concerns Lab Results  Component Value Date   HGBA1C 7.0* 02/17/2013

## 2014-04-12 NOTE — Assessment & Plan Note (Addendum)
Etiology unclear, maybe multifactorial, doubt orthostatic type, but diff includes periph vertigo, neuropathy vs other, ECG reviewed as per emr, also for cxr, head MRI, carotids, echo, but cont all same meds for now, consider neuro referral  Note:  Total time for pt hx, exam, review of record with pt in the room, determination of diagnoses and plan for further eval and tx is > 40 min, with over 50% spent in coordination and counseling of patient

## 2014-04-12 NOTE — Progress Notes (Signed)
Subjective:    Patient ID: Laura [name], female    DOB: 05-28-1945, 69 y.o.   MRN: [id number]  HPI  Here with 1 mo worsening dizziness hard to characterize, feels off balance, seems to last for hours at a time, staggery to walk, "not like vertigo where the room spins" but instaed "more like being drunk.'  Also some lightheaded, some blurry vision to wake up this am, sometimes lasts for hours as well.  Overall symptoms better with sitting and lying down, but worse to stand. No recent falls.   Did stop her klonopin recently but did not seem to help.  Ibuprofen 200 mg did not help.   No fever, but sometimes feel hot and cold for seconds at a time. No hx of neuropathy, CVA per pt though neuropathy listed as a problem.  Has seen [place] neurology in the past, dx with periph vertigo, and referred to ENT - all in 2013.  Does have several wks ongoing nasal allergy symptoms with clearish congestion, itch and sneezing, without fever, pain, ST, cough, swelling or wheezing, but this is new onset for her.   B12 level normal feb 2015, but LDL moderately elev at 168, statin intolerant in past. Last MRI neg for acute 2012.  Has flonase at home, just not using.     Also with recent worsening constipation , recurrent, usually worse after takes the immodium for diarrhea, seems to have hard small stools in the AM, then more looser towards the end of the day with freq BM, then even small incontinence. No n/v, no wt loss. Wt Readings from Last 3 Encounters:  04/12/14 206 lb 1.9 oz (93.495 kg)  03/15/14 204 lb (92.534 kg)  01/30/14 204 lb 6.4 oz (92.715 kg)  Denies worsening reflux, abd pain, dysphagia, n/v, or blood.   Craving sweets and more anxiety recently for unclear reason, hard to get full with eating, seems hungry all the time, even panic mild at night occas.  Takes lorazepam rarely, tried one recently (last one was > 2 mo) and seemed to help, seems to work faster than the [employer], which was rx for muscle relaxer per  orhto, has not actually taken in the past wk.  Has been seeing ortho for neck and shoulder pain, dx with c-spine disc dz and bilat shoulder arthritis per pt per ortho.Better pain with recent PT. Pt denies chest pain, increased sob or doe, wheezing, orthopnea, PND, increased LE swelling, palpitations, or syncope.   Pt denies new neurological symptoms such as new headache, or facial or extremity weakness or numbness   Pt denies polydipsia, polyuria, although sugars have been mild elevated recently to the 150's due to eating candy. Past Medical History  Diagnosis Date  . Diabetes mellitus   . Hyperlipidemia   . Hypertension   . Anxiety   . Reflux   . Vertigo   . Arthritis     back of neck, bones spurs on neck  . Sleep apnea     wears CPAP occasionally  . Cervical disc disease   . Neuropathy    Past Surgical History  Procedure Laterality Date  . Bladder surgery    . Cholecystectomy    . Partial hysterectomy    . Fibroids removed      breast (both breasts)  . Breast surgery      reports that she has never smoked. She has never used smokeless tobacco. She reports that she does not drink alcohol or use illicit drugs. family  history includes Diabetes in her father, maternal aunt, and sister; Hypertension in her other. There is no history of Colon cancer, Esophageal cancer, Stomach cancer, or Rectal cancer. Allergies  Allergen Reactions  . Sulfa Antibiotics     Tongue swells, hives, itching  . Codeine     itch  . Crestor [Rosuvastatin Calcium] Other (See Comments)    Did something to memory   . Hydrocodone-Homatropine   . Lipitor [Atorvastatin Calcium] Other (See Comments)    Makes weak   . Prednisone     Nervous     Current Outpatient Prescriptions on File Prior to Visit  Medication Sig Dispense Refill  . glimepiride (AMARYL) 1 MG tablet Take 0.5 tablets (0.5 mg total) by mouth 2 (two) times daily. 90 tablet 3  . glucose blood test strip 1 each by Other route daily. Use to  check blood sugars daily Dx E11.9 50 each 11  . Lancets (ONETOUCH ULTRASOFT) lancets USE FOR CHECKING BLOOD SUGARS TWICE DAILY 100 each 3  . loperamide (IMODIUM A-D) 2 MG tablet Take 1-2 mg by mouth 4 (four) times daily as needed for diarrhea or loose stools.    Marland Kitchen LORazepam (ATIVAN) 0.5 MG tablet Take 1 tablet (0.5 mg total) by mouth daily as needed for anxiety. 30 tablet 2  . valsartan (DIOVAN) 160 MG tablet TAKE TWO (2) TABLETS BY MOUTH DAILY 90 tablet 3   No current facility-administered medications on file prior to visit.    Review of Systems  Constitutional: Negative for unusual diaphoresis or night sweats HENT: Negative for ringing in ear or discharge Eyes: Negative for double vision or worsening visual disturbance.  Respiratory: Negative for choking and stridor.   Gastrointestinal: Negative for vomiting or other signifcant bowel change Genitourinary: Negative for hematuria or change in urine volume.  Musculoskeletal: Negative for other MSK pain or swelling Skin: Negative for color change and worsening wound.  Neurological: Negative for tremors and numbness other than noted  Psychiatric/Behavioral: Negative for decreased concentration or agitation other than above       Objective:   Physical Exam BP 138/80 mmHg  Pulse 77  Temp(Src) 98.3 F (36.8 C) (Oral)  Resp 18  Ht 5\' 4"  (1.626 m)  Wt 206 lb 1.9 oz (93.495 kg)  BMI 35.36 kg/m2  SpO2 98% VS noted, not ill apperaing Constitutional: Pt appears in no significant distress HENT: Head: NCAT.  Right Ear: External ear normal.  Left Ear: External ear normal.  Bilat tm's with mild erythema.  Max sinus areas non tender.  Pharynx with mild erythema, no exudate Eyes: . Pupils are equal, round, and reactive to light. Conjunctivae and EOM are normal Neck: Normal range of motion. Neck supple.  Cardiovascular: Normal rate and regular rhythm.   Pulmonary/Chest: Effort normal and breath sounds without rales or wheezing.  Abd:  Soft,  NT, ND, + BS Neurological: Pt is alert. Not confused , motor grossly intact Skin: Skin is warm. No rash, no LE edema Psychiatric: Pt behavior is normal. No agitation. 1-2+ nerovus    Assessment & Plan:

## 2014-04-12 NOTE — Assessment & Plan Note (Signed)
With recent flare of symtpoms, exam bening, for labs as ordered,  to f/u any worsening symptoms or concerns

## 2014-04-14 DIAGNOSIS — G4733 Obstructive sleep apnea (adult) (pediatric): Secondary | ICD-10-CM | POA: Diagnosis not present

## 2014-04-18 ENCOUNTER — Ambulatory Visit (HOSPITAL_COMMUNITY): Payer: Medicare Other | Attending: Cardiovascular Disease | Admitting: *Deleted

## 2014-04-18 ENCOUNTER — Telehealth: Payer: Self-pay | Admitting: Internal Medicine

## 2014-04-18 ENCOUNTER — Ambulatory Visit (HOSPITAL_BASED_OUTPATIENT_CLINIC_OR_DEPARTMENT_OTHER): Payer: Medicare Other

## 2014-04-18 DIAGNOSIS — I10 Essential (primary) hypertension: Secondary | ICD-10-CM | POA: Diagnosis not present

## 2014-04-18 DIAGNOSIS — I6523 Occlusion and stenosis of bilateral carotid arteries: Secondary | ICD-10-CM

## 2014-04-18 DIAGNOSIS — H538 Other visual disturbances: Secondary | ICD-10-CM

## 2014-04-18 DIAGNOSIS — R42 Dizziness and giddiness: Secondary | ICD-10-CM | POA: Diagnosis not present

## 2014-04-18 NOTE — Progress Notes (Signed)
Carotid Duplex Scan Performed 

## 2014-04-18 NOTE — Progress Notes (Signed)
2D Echo completed. 04/18/2014

## 2014-04-18 NOTE — Telephone Encounter (Signed)
Laura [name] called in, they are about to scan pt and she wanted Dr Jenny Reichmann to know that her BP is high 194/118 right arm and 184/100 in left arm.  Her number is [phone] Pt states she was dizzy today

## 2014-04-20 ENCOUNTER — Ambulatory Visit: Payer: Medicare Other

## 2014-04-20 ENCOUNTER — Encounter: Payer: Self-pay | Admitting: Internal Medicine

## 2014-04-20 ENCOUNTER — Ambulatory Visit (INDEPENDENT_AMBULATORY_CARE_PROVIDER_SITE_OTHER): Payer: Medicare Other | Admitting: Internal Medicine

## 2014-04-20 VITALS — BP 160/96 | HR 73 | Temp 97.8°F | Resp 18 | Ht 64.0 in | Wt 206.0 lb

## 2014-04-20 VITALS — BP 160/80

## 2014-04-20 DIAGNOSIS — E1140 Type 2 diabetes mellitus with diabetic neuropathy, unspecified: Secondary | ICD-10-CM | POA: Diagnosis not present

## 2014-04-20 DIAGNOSIS — E785 Hyperlipidemia, unspecified: Secondary | ICD-10-CM

## 2014-04-20 DIAGNOSIS — I10 Essential (primary) hypertension: Secondary | ICD-10-CM

## 2014-04-20 DIAGNOSIS — Z0130 Encounter for examination of blood pressure without abnormal findings: Secondary | ICD-10-CM

## 2014-04-20 MED ORDER — VALSARTAN-HYDROCHLOROTHIAZIDE 320-12.5 MG PO TABS: 1.0000 | ORAL_TABLET | Freq: Every day | ORAL | Status: DC

## 2014-04-20 MED ORDER — AMLODIPINE BESYLATE 5 MG PO TABS
5.0000 mg | ORAL_TABLET | Freq: Every day | ORAL | Status: DC
Start: 2014-04-20 — End: 2014-05-02

## 2014-04-20 NOTE — Progress Notes (Signed)
Pre visit review using our clinic review tool, if applicable. No additional management support is needed unless otherwise documented below in the visit note. 

## 2014-04-20 NOTE — Patient Instructions (Addendum)
OK to stop the diovan 160 mg  Please take all new medication as prescribed - the diovanHCT 320/12/5 mg - 1 per day, AND the amlodipine 5 mg per day  Please continue all other medications as before, and refills have been done if requested.  Please have the pharmacy call with any other refills you may need.  Please keep your appointments with your specialists as you may have planned

## 2014-04-20 NOTE — Telephone Encounter (Signed)
Notified pt with md response. Made nurse visit for today...Laura [name]

## 2014-04-20 NOTE — Progress Notes (Signed)
Subjective:    Patient ID: Laura [name], female    DOB: 09/02/1945, 69 y.o.   MRN: [id number]  HPI Here to fu, has had several BP recently; admits frightened and nervous last few days as her BP was over 180/118 when taken last tues for her carotid artery exam (prelim neg per pt), then echocardiogram.  Overall good compliance with treatment, and good medicine tolerability, but does mention sleepiness with amlodipine 1 yr ago, so stopped on her own. Has hx of osa treated, good compliance with cpap, sleeping well espec last few months. Willing to try the amlod again. Does have occas feet and leg swelling but none recent. Pt denies chest pain, increased sob or doe, wheezing, orthopnea, PND, increased LE swelling, palpitations, dizziness or syncope.  Pt denies new neurological symptoms such as new headache, or facial or extremity weakness or numbness   Pt denies polydipsia, polyuria,  Past Medical History  Diagnosis Date  . Diabetes mellitus   . Hyperlipidemia   . Hypertension   . Anxiety   . Reflux   . Vertigo   . Arthritis     back of neck, bones spurs on neck  . Sleep apnea     wears CPAP occasionally  . Cervical disc disease   . Neuropathy    Past Surgical History  Procedure Laterality Date  . Bladder surgery    . Cholecystectomy    . Partial hysterectomy    . Fibroids removed      breast (both breasts)  . Breast surgery      reports that she has never smoked. She has never used smokeless tobacco. She reports that she does not drink alcohol or use illicit drugs. family history includes Diabetes in her father, maternal aunt, and sister; Hypertension in her other. There is no history of Colon cancer, Esophageal cancer, Stomach cancer, or Rectal cancer. Allergies  Allergen Reactions  . Sulfa Antibiotics     Tongue swells, hives, itching  . Codeine     itch  . Crestor [Rosuvastatin Calcium] Other (See Comments)    Did something to memory   . Hydrocodone-Homatropine   . Lipitor  [Atorvastatin Calcium] Other (See Comments)    Makes weak   . Prednisone     Nervous     Current Outpatient Prescriptions on File Prior to Visit  Medication Sig Dispense Refill  . Biotin 1000 MCG tablet Take 1,000 mcg by mouth daily.    Marland Kitchen escitalopram (LEXAPRO) 10 MG tablet Take 1 tablet (10 mg total) by mouth daily. 90 tablet 3  . glimepiride (AMARYL) 1 MG tablet Take 0.5 tablets (0.5 mg total) by mouth 2 (two) times daily. 90 tablet 3  . glucose blood test strip 1 each by Other route daily. Use to check blood sugars daily Dx E11.9 50 each 11  . ibuprofen (ADVIL,MOTRIN) 200 MG tablet Take 200 mg by mouth every 6 (six) hours as needed.    . Lancets (ONETOUCH ULTRASOFT) lancets USE FOR CHECKING BLOOD SUGARS TWICE DAILY 100 each 3  . loperamide (IMODIUM A-D) 2 MG tablet Take 1-2 mg by mouth 4 (four) times daily as needed for diarrhea or loose stools.    Marland Kitchen LORazepam (ATIVAN) 0.5 MG tablet Take 1 tablet (0.5 mg total) by mouth daily as needed for anxiety. 30 tablet 2  . valsartan (DIOVAN) 160 MG tablet TAKE TWO (2) TABLETS BY MOUTH DAILY 90 tablet 3   No current facility-administered medications on file prior to visit.  Review of Systems  Constitutional: Negative for unusual diaphoresis or night sweats HENT: Negative for ringing in ear or discharge Eyes: Negative for double vision or worsening visual disturbance.  Respiratory: Negative for choking and stridor.   Gastrointestinal: Negative for vomiting or other signifcant bowel change Genitourinary: Negative for hematuria or change in urine volume.  Musculoskeletal: Negative for other MSK pain or swelling Skin: Negative for color change and worsening wound.  Neurological: Negative for tremors and numbness other than noted  Psychiatric/Behavioral: Negative for decreased concentration or agitation other than above       Objective:   Physical Exam BP 160/96 mmHg  Pulse 73  Temp(Src) 97.8 F (36.6 C) (Oral)  Resp 18  Ht 5\' 4"   (1.626 m)  Wt 206 lb 0.6 oz (93.459 kg)  BMI 35.35 kg/m2  SpO2 98% BP Readings from Last 3 Encounters:  04/20/14 160/96  04/20/14 160/80  04/12/14 138/80  VS noted,  Constitutional: Pt appears in no significant distress HENT: Head: NCAT.  Right Ear: External ear normal.  Left Ear: External ear normal.  Eyes: . Pupils are equal, round, and reactive to light. Conjunctivae and EOM are normal Neck: Normal range of motion. Neck supple.  Cardiovascular: Normal rate and regular rhythm.   Pulmonary/Chest: Effort normal and breath sounds without rales or wheezing.  Abd:  Soft, NT, ND, + BS Neurological: Pt is alert. Not confused , motor grossly intact Skin: Skin is warm. No rash, no LE edema Psychiatric: Pt behavior is normal. No agitation.      Assessment & Plan:

## 2014-04-20 NOTE — Telephone Encounter (Signed)
[place] for nurse visit in this office after the scan to re-check the BP

## 2014-04-21 ENCOUNTER — Telehealth: Payer: Self-pay | Admitting: Internal Medicine

## 2014-04-21 NOTE — Telephone Encounter (Signed)
Should be OK this time, thanks

## 2014-04-21 NOTE — Telephone Encounter (Signed)
Notified pharmacist with md response.Marland KitchenJohny Chess

## 2014-04-21 NOTE — Telephone Encounter (Signed)
Prescription for valsartan-hydrochlorothiazide (DIOVAN HCT) 320-12.5 MG per tablet [[id number]] . Since the drug has HCTZ and she has a sulfa allergy, if it would be ok for her to take.

## 2014-04-22 NOTE — Assessment & Plan Note (Signed)
stable overall by history and exam, recent data reviewed with pt, and pt to continue medical treatment as before,  to f/u any worsening symptoms or concerns Lab Results  Component Value Date   HGBA1C 7.1* 04/12/2014

## 2014-04-22 NOTE — Assessment & Plan Note (Signed)
stable overall by history and exam, recent data reviewed with pt, and pt to continue medical treatment as before,  to f/u any worsening symptoms or concerns Lab Results  Component Value Date   LDLCALC 166* 04/12/2014   Declines statin, for lower chol diet

## 2014-04-22 NOTE — Assessment & Plan Note (Signed)
Uncontrolled, for change diovan to diovan HCT and amlodipine 5 mg, o/w stable overall by history and exam, recent data reviewed with pt,  to f/u any worsening symptoms or concerns and BP at home and next visit  BP Readings from Last 3 Encounters:  04/20/14 160/96  04/20/14 160/80  04/12/14 138/80

## 2014-04-28 ENCOUNTER — Encounter: Payer: Self-pay | Admitting: Internal Medicine

## 2014-04-28 ENCOUNTER — Telehealth: Payer: Self-pay | Admitting: Internal Medicine

## 2014-04-28 NOTE — Telephone Encounter (Signed)
She is following up on her message she sent on MyChart. Has side effects with her blood pressure medicine. Sleeping all day and very weak, dizzy and also has diarrhea. She has been taking imodium for her diarrhea.  Not sure if medicine needs to be changed or not.

## 2014-04-28 NOTE — Telephone Encounter (Signed)
Ok to stop the diovan HCT for now though this does not cause diarrha (but can make dehydration and lower BP worse)  Please consider OV at Sat clinic in am - ? Viral illness

## 2014-04-28 NOTE — Telephone Encounter (Signed)
Called pt no answer LMOM with md response, also responded back through [place]...Laura [name]

## 2014-05-02 ENCOUNTER — Ambulatory Visit (INDEPENDENT_AMBULATORY_CARE_PROVIDER_SITE_OTHER): Payer: Medicare Other | Admitting: Internal Medicine

## 2014-05-02 ENCOUNTER — Encounter: Payer: Self-pay | Admitting: Internal Medicine

## 2014-05-02 VITALS — BP 136/84 | HR 89 | Temp 98.2°F | Resp 18 | Ht 64.0 in | Wt 206.1 lb

## 2014-05-02 DIAGNOSIS — F411 Generalized anxiety disorder: Secondary | ICD-10-CM

## 2014-05-02 DIAGNOSIS — I10 Essential (primary) hypertension: Secondary | ICD-10-CM

## 2014-05-02 DIAGNOSIS — R197 Diarrhea, unspecified: Secondary | ICD-10-CM | POA: Diagnosis not present

## 2014-05-02 MED ORDER — VALSARTAN 320 MG PO TABS: 320.0000 mg | ORAL_TABLET | Freq: Every day | ORAL | Status: DC

## 2014-05-02 NOTE — Patient Instructions (Signed)
Please continue all other medications as before, including the diovan at 320 mg only  Please have the pharmacy call with any other refills you may need.  Please continue your efforts at being more active, low cholesterol diet, and weight control.  You are otherwise up to date with prevention measures today.  Please keep your appointments with your specialists as you may have planned  You will be contacted regarding the referral for: Gastroenterology

## 2014-05-02 NOTE — Assessment & Plan Note (Signed)
Ok to cont xanax prn,  to f/u any worsening symptoms or concerns

## 2014-05-02 NOTE — Progress Notes (Signed)
Pre visit review using our clinic review tool, if applicable. No additional management support is needed unless otherwise documented below in the visit note. 

## 2014-05-02 NOTE — Assessment & Plan Note (Signed)
?   IBS vs other, pt requests GI referral, to f/u any worsening symptoms or concerns

## 2014-05-02 NOTE — Assessment & Plan Note (Signed)
Suspect was overocontrolled with recent med changes, will cont diovan 320 only, to f/u any worsening symptoms or concerns BP Readings from Last 3 Encounters:  05/02/14 136/84  04/20/14 160/96  04/20/14 160/80

## 2014-05-02 NOTE — Progress Notes (Signed)
Subjective:    Patient ID: Laura [name], female    DOB: 05-02-1945, 69 y.o.   MRN: [id number]  HPI   Here to f/u, c/o bowel changes persisting, such has harder stools in the am difficult to pass, then less hard then later on even watery later in the day, has to take immodium later in the day, ongoing for 3 months, worse in the past wk, wondering if BP med has something to do with worsening,  No Nausea, appetite ok, no wt loss,   Did also feel weak and dizzy recently with onset use of the diovan HCT, and amlodipine cuases her to sleep, so went back to most recent diovan rx alone as she was afraid not to take anything.  Other people remark she seems breathless talking on the phone. Still feels dizzy even today.  Denies worsening depressive symptoms, suicidal ideation, or panic; has ongoing anxiety Past Medical History  Diagnosis Date  . Diabetes mellitus   . Hyperlipidemia   . Hypertension   . Anxiety   . Reflux   . Vertigo   . Arthritis     back of neck, bones spurs on neck  . Sleep apnea     wears CPAP occasionally  . Cervical disc disease   . Neuropathy    Past Surgical History  Procedure Laterality Date  . Bladder surgery    . Cholecystectomy    . Partial hysterectomy    . Fibroids removed      breast (both breasts)  . Breast surgery      reports that she has never smoked. She has never used smokeless tobacco. She reports that she does not drink alcohol or use illicit drugs. family history includes Diabetes in her father, maternal aunt, and sister; Hypertension in her other. There is no history of Colon cancer, Esophageal cancer, Stomach cancer, or Rectal cancer. Allergies  Allergen Reactions  . Sulfa Antibiotics     Tongue swells, hives, itching  . Codeine     itch  . Crestor [Rosuvastatin Calcium] Other (See Comments)    Did something to memory   . Hydrocodone-Homatropine   . Lipitor [Atorvastatin Calcium] Other (See Comments)    Makes weak   . Prednisone    Nervous     Current Outpatient Prescriptions on File Prior to Visit  Medication Sig Dispense Refill  . Biotin 1000 MCG tablet Take 1,000 mcg by mouth daily.    Marland Kitchen glimepiride (AMARYL) 1 MG tablet Take 0.5 tablets (0.5 mg total) by mouth 2 (two) times daily. 90 tablet 3  . glucose blood test strip 1 each by Other route daily. Use to check blood sugars daily Dx E11.9 50 each 11  . ibuprofen (ADVIL,MOTRIN) 200 MG tablet Take 200 mg by mouth every 6 (six) hours as needed.    . Lancets (ONETOUCH ULTRASOFT) lancets USE FOR CHECKING BLOOD SUGARS TWICE DAILY 100 each 3  . loperamide (IMODIUM A-D) 2 MG tablet Take 1-2 mg by mouth 4 (four) times daily as needed for diarrhea or loose stools.    Marland Kitchen LORazepam (ATIVAN) 0.5 MG tablet Take 1 tablet (0.5 mg total) by mouth daily as needed for anxiety. 30 tablet 2  . amLODipine (NORVASC) 5 MG tablet Take 1 tablet (5 mg total) by mouth daily. (Patient not taking: Reported on 05/02/2014) 90 tablet 3  . escitalopram (LEXAPRO) 10 MG tablet Take 1 tablet (10 mg total) by mouth daily. (Patient not taking: Reported on 05/02/2014) 90 tablet 3  .  valsartan-hydrochlorothiazide (DIOVAN HCT) 320-12.5 MG per tablet Take 1 tablet by mouth daily. (Patient not taking: Reported on 05/02/2014) 90 tablet 3   No current facility-administered medications on file prior to visit.    Review of Systems  Constitutional: Negative for unusual diaphoresis or night sweats HENT: Negative for ringing in ear or discharge Eyes: Negative for double vision or worsening visual disturbance.  Respiratory: Negative for choking and stridor.   Gastrointestinal: Negative for vomiting or other signifcant bowel change Genitourinary: Negative for hematuria or change in urine volume.  Musculoskeletal: Negative for other MSK pain or swelling Skin: Negative for color change and worsening wound.  Neurological: Negative for tremors and numbness other than noted  Psychiatric/Behavioral: Negative for decreased  concentration or agitation other than above       Objective:   Physical Exam BP 136/84 mmHg  Pulse 89  Temp(Src) 98.2 F (36.8 C) (Oral)  Resp 18  Ht 5\' 4"  (1.626 m)  Wt 206 lb 2.2 oz (93.504 kg)  BMI 35.37 kg/m2  SpO2 97% VS noted,  Constitutional: Pt appears in no significant distress HENT: Head: NCAT.  Right Ear: External ear normal.  Left Ear: External ear normal.  Eyes: . Pupils are equal, round, and reactive to light. Conjunctivae and EOM are normal Neck: Normal range of motion. Neck supple.  Cardiovascular: Normal rate and regular rhythm.   Pulmonary/Chest: Effort normal and breath sounds without rales or wheezing.  Abd:  Soft, NT, ND, + BS Neurological: Pt is alert. Not confused , motor grossly intact Skin: Skin is warm. No rash, no LE edema Psychiatric: Pt behavior is normal. No agitation. mild nervous    Assessment & Plan:

## 2014-05-03 DIAGNOSIS — M25512 Pain in left shoulder: Secondary | ICD-10-CM | POA: Diagnosis not present

## 2014-05-09 ENCOUNTER — Ambulatory Visit: Payer: Self-pay | Admitting: Pulmonary Disease

## 2014-05-18 ENCOUNTER — Encounter: Payer: Self-pay | Admitting: Internal Medicine

## 2014-06-02 ENCOUNTER — Encounter: Payer: Self-pay | Admitting: Internal Medicine

## 2014-06-13 ENCOUNTER — Encounter: Payer: Self-pay | Admitting: Gastroenterology

## 2014-06-13 ENCOUNTER — Ambulatory Visit (INDEPENDENT_AMBULATORY_CARE_PROVIDER_SITE_OTHER): Payer: Medicare Other | Admitting: Gastroenterology

## 2014-06-13 VITALS — BP 152/76 | HR 84 | Ht 62.5 in | Wt 209.1 lb

## 2014-06-13 DIAGNOSIS — K589 Irritable bowel syndrome without diarrhea: Secondary | ICD-10-CM | POA: Diagnosis not present

## 2014-06-13 NOTE — Patient Instructions (Signed)
Please start taking citrucel (orange flavored) powder fiber supplement.  This may cause some bloating at first but that usually goes away. Begin with a small spoonful and work your way up to a large, heaping spoonful daily over a week. Call in 5-6 weeks to report on your progress, response.

## 2014-06-13 NOTE — Progress Notes (Signed)
Review of pertinent gastrointestinal problems: 1. Constipation: functional 2. Adenomatous polyp: 07/2011 Colonoscopy Dr. Ardis Hughs for constipation; single small TA (subCM), recommended recall colonoscopy at 5 years.   HPI: This is a   very pleasant 69 year old woman whom I last saw about 3 years ago  Chief complaint is alternating bowel habits, mild lower abdominal discomfort   Has been having; constipation at times still.  Balls of hard stools.  Even has diffuculty later on in the day.    Then loose stools.  About 2 times per week she has constipation then loose stools.  Takes imodium, about 3 times per week.  Uusally on pill per day.  Takes fiber supplement about twice per week and she noticed much improvement.  Has a lot of gas at times.    Pressure builds up, then gas.  Overall her weight is up a bit in the past month.   Past Medical History  Diagnosis Date  . Diabetes mellitus   . Hyperlipidemia   . Hypertension   . Anxiety   . Reflux   . Vertigo   . Arthritis     back of neck, bones spurs on neck  . Sleep apnea     wears CPAP occasionally  . Cervical disc disease   . Neuropathy     Past Surgical History  Procedure Laterality Date  . Bladder surgery    . Cholecystectomy    . Partial hysterectomy    . Fibroids removed      breast (both breasts)  . Breast surgery      Current Outpatient Prescriptions  Medication Sig Dispense Refill  . acetaminophen (TYLENOL) 325 MG tablet Take 325 mg by mouth as needed.    . clonazePAM (KLONOPIN) 0.5 MG tablet Take 1 tablet by mouth as needed.    Marland Kitchen glimepiride (AMARYL) 1 MG tablet Take 0.5 tablets (0.5 mg total) by mouth 2 (two) times daily. 90 tablet 3  . glucose blood test strip 1 each by Other route daily. Use to check blood sugars daily Dx E11.9 50 each 11  . Lancets (ONETOUCH ULTRASOFT) lancets USE FOR CHECKING BLOOD SUGARS TWICE DAILY 100 each 3  . loperamide (IMODIUM A-D) 2 MG tablet Take 1-2 mg by mouth 4 (four)  times daily as needed for diarrhea or loose stools.    Marland Kitchen LORazepam (ATIVAN) 0.5 MG tablet Take 1 tablet (0.5 mg total) by mouth daily as needed for anxiety. 30 tablet 2  . omeprazole (PRILOSEC) 20 MG capsule Take 20 mg by mouth daily.    . valsartan (DIOVAN) 320 MG tablet Take 1 tablet (320 mg total) by mouth daily. 90 tablet 3   No current facility-administered medications for this visit.    Allergies as of 06/13/2014 - Review Complete 06/13/2014  Allergen Reaction Noted  . Sulfa antibiotics  10/21/2010  . Codeine  10/21/2010  . Crestor [rosuvastatin calcium] Other (See Comments) 10/23/2010  . Hydrocodone-homatropine  04/19/2013  . Lipitor [atorvastatin calcium] Other (See Comments) 10/23/2010  . Prednisone  10/21/2010    Family History  Problem Relation Age of Onset  . Hypertension Other   . Diabetes Father   . Diabetes Sister   . Diabetes Maternal Aunt   . Colon cancer Neg Hx   . Esophageal cancer Neg Hx   . Stomach cancer Neg Hx   . Rectal cancer Neg Hx     History   Social History  . Marital Status: Divorced    Spouse Name: N/A  . Number  of Children: 4  . Years of Education: 16   Occupational History  . 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 Smoking status: Never Smoker   . Smokeless tobacco: Never Used  . Alcohol Use: No  . Drug Use: No  . Sexual Activity: Not Currently   Other Topics Concern  . Not on file   Social History Narrative     Physical Exam: BP 152/76 mmHg  Pulse 84  Ht 5' 2.5" (1.588 m)  Wt 209 lb 2 oz (94.858 kg)  BMI 37.62 kg/m2 Constitutional: generally well-appearing Psychiatric: alert and oriented x3 Abdomen: soft, nontender, nondistended, no obvious ascites, no peritoneal signs, normal bowel sounds   Assessment and plan: 69 y.o. female with alternating bowel habits, mild constipation predominant IBS  She was quite clear that when she was taking fiber supplements on a daily basis her bowels were  significantly improved. She gradually got away from that and has since then struggled with alternating loose, constipation stools. I recommended she simply restart the fiber supplements on a daily basis and she will call to report on her response in 5-6 weeks. I did not note above but I did look at her recent labs showing normal CBC, normal liver tests.   Owens Loffler, MD [facility] 06/13/2014, 9:14 AM

## 2014-06-23 ENCOUNTER — Encounter: Payer: Medicare Other | Admitting: Pulmonary Disease

## 2014-06-23 NOTE — Progress Notes (Deleted)
   Subjective:    Patient ID: Laura [name], female    DOB: 01-Nov-1945, 69 y.o.   MRN: [id number]  HPI    Review of Systems  Constitutional: Negative for fever and unexpected weight change.  HENT: Negative for congestion, dental problem, ear pain, nosebleeds, postnasal drip, rhinorrhea, sinus pressure, sneezing, sore throat and trouble swallowing.   Eyes: Negative for redness and itching.  Respiratory: Negative for cough, chest tightness, shortness of breath and wheezing.   Cardiovascular: Negative for palpitations and leg swelling.  Gastrointestinal: Negative for nausea and vomiting.  Genitourinary: Negative for dysuria.  Musculoskeletal: Negative for joint swelling.  Skin: Negative for rash.  Neurological: Negative for headaches.  Hematological: Does not bruise/bleed easily.  Psychiatric/Behavioral: Negative for dysphoric mood. The patient is not nervous/anxious.        Objective:   Physical Exam        Assessment & Plan:

## 2014-06-26 ENCOUNTER — Encounter: Payer: Self-pay | Admitting: Internal Medicine

## 2014-06-26 ENCOUNTER — Ambulatory Visit (INDEPENDENT_AMBULATORY_CARE_PROVIDER_SITE_OTHER): Payer: Medicare Other | Admitting: Internal Medicine

## 2014-06-26 VITALS — BP 154/82 | HR 92 | Ht 64.0 in | Wt 180.0 lb

## 2014-06-26 DIAGNOSIS — R058 Other specified cough: Secondary | ICD-10-CM | POA: Insufficient documentation

## 2014-06-26 DIAGNOSIS — R05 Cough: Secondary | ICD-10-CM | POA: Diagnosis not present

## 2014-06-26 NOTE — Progress Notes (Signed)
Subjective:    Patient ID: Laura [name], female    DOB: Jun 03, 1945    MRN: [id number]  HPI  9 yobf never smoker seasonal rhinitis spring = fall better since age  70-40s then cough started around 2012 and has waxed and waned ever since so self referred to pulmonary clinic 06/26/2014 for chronic/ recurrent cough.   06/26/2014 1st [facility] office visit/ Rayna Brenner   Chief Complaint  Patient presents with  . Pulmonary Consult    Self referred for cough. Pt c/o dry persistant hacking cough. States it can't last for several minutes before stopping.  chronic cough x at least 2 y prior to OV , happers 2-3 days per week, very violent when coughing/ prev rxn to ? benicar  3-10 sec at a time/ sporadic/ dry more than wet/ day > noct - on prn prilosec 20 mg / water helps, uses cough drops and not better.    Kouffman Reflux v Neurogenic Cough Differentiator Reflux Comments  Do you awaken from a sound sleep coughing violently?                            With trouble breathing? Rarely    Do you have choking episodes when you cannot  Get enough air, gasping for air ?              No    Do you usually cough when you lie down into  The bed, or when you just lie down to rest ?                          no   Do you usually cough after meals or eating?         no   Do you cough when (or after) you bend over?    No    GERD SCORE     Kouffman Reflux v Neurogenic Cough Differentiator Neurogenic   Do you more-or-less cough all day long? sporadic   Does change of temperature make you cough? Not sure    Does laughing or chuckling cause you to cough? No    Do fumes (perfume, automobile fumes, burned  Toast, etc.,) cause you to cough ?      smoke   Does speaking, singing, or talking on the phone cause you to cough   ?               Singing    Neurogenic/Airway score          Review of Systems  Constitutional: Negative for fever and unexpected weight change.  HENT: Negative for nosebleeds, postnasal  drip, rhinorrhea, sinus pressure, sneezing, sore throat and trouble swallowing.   Respiratory: Positive for cough. Negative for chest tightness, shortness of breath and wheezing.   Cardiovascular: Negative for palpitations and leg swelling.  Gastrointestinal: Negative for nausea and vomiting.  Genitourinary: Negative for dysuria.  Musculoskeletal: Negative for joint swelling.  Skin: Negative for rash.  Neurological: Negative for headaches.  Hematological: Does not bruise/bleed easily.  Psychiatric/Behavioral: The patient is not nervous/anxious.        Objective:   Physical Exam  amb bf nad occ vigorous throat clearing    Wt Readings from Last 3 Encounters:  06/26/14 180 lb (81.647 kg)  06/13/14 209 lb 2 oz (94.858 kg)  05/02/14 206 lb 2.2 oz (93.504 kg)    Vital signs reviewed   HEENT:  nl dentition, turbinates, and orophanx. Nl external ear canals without cough reflex   NECK :  without JVD/Nodes/TM/ nl carotid upstrokes bilaterally   LUNGS: no acc muscle use, clear to A and P bilaterally without cough on insp or exp maneuvers   CV:  RRR  no s3 or murmur or increase in P2, no edema   ABD:  soft and nontender with nl excursion in the supine position. No bruits or organomegaly, bowel sounds nl  MS:  warm without deformities, calf tenderness, cyanosis or clubbing  SKIN: warm and dry without lesions    NEURO:  alert, approp, no deficits      I personally reviewed images and agree with radiology impression as follows:  CXR:  03/16/14 No acute cardiopulmonary disease .      Assessment & Plan:

## 2014-06-26 NOTE — Patient Instructions (Signed)
Please see patient coordinator before you leave today  to schedule sinus CT and I will call you the results  Try prilosec 40 mg (2 x 20)   Take 30-60 min before first meal of the day and Pepcid ac (famotidine) 20 mg one bedtime until cough is completely gone for at least a week without the need for cough suppression  GERD (REFLUX)  is an extremely common cause of respiratory symptoms just like yours , many times with no obvious heartburn at all.    It can be treated with medication, but also with lifestyle changes including avoidance of late meals, elevation of the head of your bed (ideally with 6 inch  bed blocks) excessive alcohol, smoking cessation, and avoid fatty foods, chocolate, peppermint, colas, red wine, and acidic juices such as orange juice.  NO MINT OR MENTHOL PRODUCTS SO NO COUGH DROPS  USE SUGARLESS CANDY INSTEAD (Jolley ranchers or Stover's or Life Savers) or even ice chips will also do - the key is to swallow to prevent all throat clearing. NO OIL BASED VITAMINS - use powdered substitutes.   Please schedule a follow up office visit in 4 weeks, sooner if needed

## 2014-06-26 NOTE — Assessment & Plan Note (Signed)
The most common causes of chronic cough in immunocompetent adults include the following: upper airway cough syndrome (UACS), previously referred to as postnasal drip syndrome (PNDS), which is caused by variety of rhinosinus conditions; (2) asthma; (3) GERD; (4) chronic bronchitis from cigarette smoking or other inhaled environmental irritants; (5) nonasthmatic eosinophilic bronchitis; and (6) bronchiectasis.   These conditions, singly or in combination, have accounted for up to 94% of the causes of chronic cough in prospective studies.   Other conditions have constituted no >6% of the causes in prospective studies These have included bronchogenic carcinoma, chronic interstitial pneumonia, sarcoidosis, left ventricular failure, ACEI-induced cough, and aspiration from a condition associated with pharyngeal dysfunction.    Chronic cough is often simultaneously caused by more than one condition. A single cause has been found from 38 to 82% of the time, multiple causes from 18 to 62%. Multiply caused cough has been the result of three diseases up to 42% of the time.       Based on hx and exam, this is most likely:  Classic Upper airway cough syndrome, so named because it's frequently impossible to sort out how much is  CR/sinusitis with freq throat clearing (which can be related to primary GERD)   vs  causing  secondary (" extra esophageal")  GERD from wide swings in gastric pressure that occur with throat clearing, often  promoting self use of mint and menthol lozenges that reduce the lower esophageal sphincter tone and exacerbate the problem further in a cyclical fashion.   These are the same pts (now being labeled as having "irritable larynx syndrome" by some cough centers) who not infrequently have a history of having failed to tolerate ace inhibitors,  dry powder inhalers or biphosphonates or report having atypical reflux symptoms that don't respond to standard doses of PPI , and are easily confused as  having aecopd or asthma flares by even experienced allergists/ pulmonologists.   The first step is to maximize acid suppression and eliminate cyclical coughing by using non mint/ menthol hard rock candy and eval for possible sinus dz since prev eval fby NMRI 2012 showed sign chronic sinusitis  I had an extended discussion with the patient reviewing all relevant studies completed to date and  lasting   20 m  The standardized cough guidelines published in Chest by Lissa Morales in 2006 are still the best available and consist of a multiple step process (up to 12!) , not a single office visit,  and are intended  to address this problem logically,  with an alogrithm dependent on response to empiric treatment at  each progressive step  to determine a specific diagnosis with  minimal addtional testing needed. Therefore if adherence is an issue or can't be accurately verified,  it's very unlikely the standard evaluation and treatment will be successful here.    Furthermore, response to therapy (other than acute cough suppression, which should only be used short term with avoidance of narcotic containing cough syrups if possible), can be a gradual process for which the patient may perceive immediate benefit.  Unlike going to an eye doctor where the best perscription is almost always the first one and is immediately effective, this is almost never the case in the management of chronic cough syndromes. Therefore the patient needs to commit up front to consistently adhere to recommendations  for up to 6 weeks of therapy directed at the likely underlying problem(s) before the response can be reasonably evaluated.   Each maintenance medication was  reviewed in detail including most importantly the difference between maintenance and as needed and under what circumstances the prns are to be used.  Please see instructions for details which were reviewed in writing and the patient given a copy.

## 2014-07-03 ENCOUNTER — Other Ambulatory Visit: Payer: Self-pay

## 2014-07-05 ENCOUNTER — Telehealth: Payer: Self-pay | Admitting: Internal Medicine

## 2014-07-05 DIAGNOSIS — M19012 Primary osteoarthritis, left shoulder: Secondary | ICD-10-CM | POA: Diagnosis not present

## 2014-07-05 DIAGNOSIS — M7542 Impingement syndrome of left shoulder: Secondary | ICD-10-CM | POA: Diagnosis not present

## 2014-07-05 NOTE — Telephone Encounter (Signed)
If no cp, sob or other unusual symtpoms, ok to see in OV next availabe, any MDor NP ok

## 2014-07-05 NOTE — Telephone Encounter (Signed)
Patient will call back to schedule ?

## 2014-07-05 NOTE — Telephone Encounter (Signed)
Patient's blood pressure read 159/80 last week and is today its reading 179/90. Since you're schedule is full for the next couple days she is wondering if maybe her medicine may be changed.  Please advise.

## 2014-07-06 ENCOUNTER — Ambulatory Visit (INDEPENDENT_AMBULATORY_CARE_PROVIDER_SITE_OTHER)
Admission: RE | Admit: 2014-07-06 | Discharge: 2014-07-06 | Disposition: A | Discharge status: Home / Self Care | Payer: Medicare Other | Source: Ambulatory Visit | Attending: Internal Medicine | Admitting: Internal Medicine

## 2014-07-06 DIAGNOSIS — R058 Other specified cough: Secondary | ICD-10-CM

## 2014-07-06 DIAGNOSIS — R05 Cough: Secondary | ICD-10-CM | POA: Diagnosis not present

## 2014-07-06 NOTE — Progress Notes (Signed)
Quick Note:  LMTCB ______ 

## 2014-07-07 ENCOUNTER — Ambulatory Visit (INDEPENDENT_AMBULATORY_CARE_PROVIDER_SITE_OTHER): Payer: Medicare Other | Admitting: Internal Medicine

## 2014-07-07 ENCOUNTER — Encounter: Payer: Self-pay | Admitting: Internal Medicine

## 2014-07-07 VITALS — HR 88 | Temp 98.2°F | Ht 64.0 in | Wt 210.0 lb

## 2014-07-07 DIAGNOSIS — I951 Orthostatic hypotension: Secondary | ICD-10-CM | POA: Insufficient documentation

## 2014-07-07 DIAGNOSIS — E1140 Type 2 diabetes mellitus with diabetic neuropathy, unspecified: Secondary | ICD-10-CM | POA: Diagnosis not present

## 2014-07-07 DIAGNOSIS — I10 Essential (primary) hypertension: Secondary | ICD-10-CM

## 2014-07-07 MED ORDER — AMLODIPINE BESYLATE 5 MG PO TABS: 5.0000 mg | ORAL_TABLET | Freq: Every day | ORAL | Status: DC

## 2014-07-07 NOTE — Progress Notes (Signed)
Pre visit review using our clinic review tool, if applicable. No additional management support is needed unless otherwise documented below in the visit note. 

## 2014-07-07 NOTE — Patient Instructions (Signed)
Please take all new medication as prescribed - the amlodipine 5 mg per day  Ok to drink more fluids in the next week  Please continue all other medications as before, and refills have been done if requested.  Please have the pharmacy call with any other refills you may need.  Please keep your appointments with your specialists as you may have planned  I think we can hold on further xrays and labs today  Please return in 1 week, or sooner if needed

## 2014-07-07 NOTE — Progress Notes (Signed)
Subjective:    Patient ID: Laura [name], female    DOB: 03/05/1945, 69 y.o.   MRN: [id number]  HPI  Here to f/u, had recent visit with Dr Eudelia Bunch with left shoulder cortisone 2 mo ago, repeated recently, has felt off since then with feeling "heavy" and dizzy, low energy with moving around. CBG's have been 180 occas, but per pt only 120's usually to 140s, but usually only checks in the AM.  Denies worsening reflux, abd pain, dysphagia, n/v, bowel change or blood.  Taking prilosec 40 mg in am and pepcid ac qhs which has improved recent symtpoms per Dr Justin Mend.  Has had some increased constipation recently as well. Has hx of IBS. Had one diarrheal episode x 1 last wk but minor, no blood. No N/v. Not on diuretic.   Pt denies fever, wt loss, night sweats, loss of appetite, or other constitutional symptoms.  Denies urinary symptoms such as dysuria, frequency, urgency, flank pain, hematuria or n/v, fever, chills. She thinks June 13 wt is incorrect (I agree). Wt Readings from Last 3 Encounters:  07/07/14 210 lb (95.255 kg)  06/26/14 180 lb (81.647 kg)  06/13/14 209 lb 2 oz (94.858 kg)   Past Medical History  Diagnosis Date  . Diabetes mellitus   . Hyperlipidemia   . Hypertension   . Anxiety   . Reflux   . Vertigo   . Arthritis     back of neck, bones spurs on neck  . Sleep apnea     wears CPAP occasionally  . Cervical disc disease   . Neuropathy    Past Surgical History  Procedure Laterality Date  . Bladder surgery    . Cholecystectomy    . Partial hysterectomy    . Fibroids removed      breast (both breasts)  . Breast surgery      reports that she has never smoked. She has never used smokeless tobacco. She reports that she does not drink alcohol or use illicit drugs. family history includes Diabetes in her father, maternal aunt, and sister; Hypertension in her other. There is no history of Colon cancer, Esophageal cancer, Stomach cancer, or Rectal cancer. Allergies  Allergen  Reactions  . Sulfa Antibiotics     Tongue swells, hives, itching  . Codeine     itch  . Crestor [Rosuvastatin Calcium] Other (See Comments)    Did something to memory   . Hydrocodone-Homatropine   . Lipitor [Atorvastatin Calcium] Other (See Comments)    Makes weak   . Prednisone     Nervous     Review of Systems  Constitutional: Negative for unusual diaphoresis or night sweats HENT: Negative for ringing in ear or discharge Eyes: Negative for double vision or worsening visual disturbance.  Respiratory: Negative for choking and stridor.   Gastrointestinal: Negative for vomiting or other signifcant bowel change Genitourinary: Negative for hematuria or change in urine volume.  Musculoskeletal: Negative for other MSK pain or swelling Skin: Negative for color change and worsening wound.  Neurological: Negative for tremors and numbness other than noted  Psychiatric/Behavioral: Negative for decreased concentration or agitation other than above  '    Objective:   Physical Exam  Pulse 88  Temp(Src) 98.2 F (36.8 C) (Oral)  Ht 5\' 4"  (1.626 m)  Wt 210 lb (95.255 kg)  BMI 36.03 kg/m2 VS noted, including orthostatic BP's - sbp dropping 174/84 to 15694 with standing Constitutional: Pt appears in no significant distress HENT: Head: NCAT.  Right  Ear: External ear normal.  Left Ear: External ear normal.  Eyes: . Pupils are equal, round, and reactive to light. Conjunctivae and EOM are normal Neck: Normal range of motion. Neck supple.  Cardiovascular: Normal rate and regular rhythm.   Pulmonary/Chest: Effort normal and breath sounds without rales or wheezing.  Abd:  Soft, NT, ND, + BS Neurological: Pt is alert. Not confused , motor grossly intact Skin: Skin is warm. No rash, no LE edema Psychiatric: Pt behavior is normal. No agitation.     Assessment & Plan:

## 2014-07-08 NOTE — Assessment & Plan Note (Signed)
[place] for try again for amlod 5 qd, had tried in past but stopped due to sleepiness, d/w pt - ok to try again for overall better BP control BP Readings from Last 3 Encounters:  06/26/14 154/82  06/13/14 152/76  05/02/14 136/84

## 2014-07-08 NOTE — Assessment & Plan Note (Addendum)
Mild to mod, in the context of uncontrolled HTN overall, no recent fever, n/v, other new GI symptoms or diuretic use, possibly I think related to increased sugars recently with cortisone shots and more polyuria/increased urine volume out, ok for increased po fluids over the next few days,  to f/u any worsening symptoms or concerns, needs better BS control with steroid use

## 2014-07-08 NOTE — Assessment & Plan Note (Signed)
Mild uncontrolled recently with cortisone shots, o/w stable overall by history and exam, recent data reviewed with pt, and pt to continue medical treatment as before,  to f/u any worsening symptoms or concerns Lab Results  Component Value Date   HGBA1C 7.1* 04/12/2014

## 2014-07-14 ENCOUNTER — Encounter: Payer: Self-pay | Admitting: Internal Medicine

## 2014-07-14 ENCOUNTER — Ambulatory Visit (INDEPENDENT_AMBULATORY_CARE_PROVIDER_SITE_OTHER): Payer: Medicare Other | Admitting: Internal Medicine

## 2014-07-14 VITALS — BP 130/84 | HR 80 | Temp 98.4°F | Ht 64.0 in | Wt 208.0 lb

## 2014-07-14 DIAGNOSIS — E1140 Type 2 diabetes mellitus with diabetic neuropathy, unspecified: Secondary | ICD-10-CM | POA: Diagnosis not present

## 2014-07-14 DIAGNOSIS — I951 Orthostatic hypotension: Secondary | ICD-10-CM

## 2014-07-14 DIAGNOSIS — I10 Essential (primary) hypertension: Secondary | ICD-10-CM | POA: Diagnosis not present

## 2014-07-14 NOTE — Assessment & Plan Note (Signed)
stable overall by history and exam, recent data reviewed with pt, and pt to continue medical treatment as before,  to f/u any worsening symptoms or concerns BP Readings from Last 3 Encounters:  07/14/14 130/84  06/26/14 154/82  06/13/14 152/76

## 2014-07-14 NOTE — Progress Notes (Signed)
Subjective:    Patient ID: Laura [name], female    DOB: [birth year], 69 y.o.   MRN: [id number]  HPI  Here to f/u; overall doing better, denies dizziness but still feels she cannot walk as "easily" as in the pusat but cannot be more specific.  Pt denies chest pain, increasing sob or doe, wheezing, orthopnea, PND, increased LE swelling, palpitations, dizziness or syncope.  Pt denies new neurological symptoms such as new headache, or facial or extremity weakness or numbness.  Pt denies polydipsia, polyuria, or low sugar episode.   Pt denies new neurological symptoms such as new headache, or facial or extremity weakness or numbness.   Pt states overall good compliance with meds, mostly trying to follow appropriate diet  CBG's highest has been 181, usually more like 130's overall during the day .  Has lost her home BP machine so not check until today Wt Readings from Last 3 Encounters:  07/14/14 208 lb (94.348 kg)  07/07/14 210 lb (95.255 kg)  06/26/14 180 lb (81.647 kg)  mentions a "full blown anxiety attack" that woke her up at 1 am with palp's, anxiety, better with  Benzo prn.  Uncle died recently, has been more stressed, last panic attack several months ago, so not more frequent. Denies worsening depressive symptoms, suicidal ideation, or panic; has ongoing anxiety Past Medical History  Diagnosis Date  . Diabetes mellitus   . Hyperlipidemia   . Hypertension   . Anxiety   . Reflux   . Vertigo   . Arthritis     back of neck, bones spurs on neck  . Sleep apnea     wears CPAP occasionally  . Cervical disc disease   . Neuropathy    Past Surgical History  Procedure Laterality Date  . Bladder surgery    . Cholecystectomy    . Partial hysterectomy    . Fibroids removed      breast (both breasts)  . Breast surgery      reports that she has never smoked. She has never used smokeless tobacco. She reports that she does not drink alcohol or use illicit drugs. family history includes Diabetes  in her father, maternal aunt, and sister; Hypertension in her other. There is no history of Colon cancer, Esophageal cancer, Stomach cancer, or Rectal cancer. Allergies  Allergen Reactions  . Sulfa Antibiotics     Tongue swells, hives, itching  . Codeine     itch  . Crestor [Rosuvastatin Calcium] Other (See Comments)    Did something to memory   . Hydrocodone-Homatropine   . Lipitor [Atorvastatin Calcium] Other (See Comments)    Makes weak   . Prednisone     Nervous     Current Outpatient Prescriptions on File Prior to Visit  Medication Sig Dispense Refill  . acetaminophen (TYLENOL) 325 MG tablet Take 325 mg by mouth as needed.    Marland Kitchen amLODipine (NORVASC) 5 MG tablet Take 1 tablet (5 mg total) by mouth daily. 90 tablet 3  . clonazePAM (KLONOPIN) 0.5 MG tablet Take 1 tablet by mouth as needed.    . famotidine (PEPCID AC) 10 MG chewable tablet Chew 10 mg by mouth at bedtime as needed for heartburn.    Marland Kitchen glimepiride (AMARYL) 1 MG tablet Take 0.5 tablets (0.5 mg total) by mouth 2 (two) times daily. 90 tablet 3  . glucose blood test strip 1 each by Other route daily. Use to check blood sugars daily Dx E11.9 50 each 11  .  Lancets (ONETOUCH ULTRASOFT) lancets USE FOR CHECKING BLOOD SUGARS TWICE DAILY 100 each 3  . loperamide (IMODIUM A-D) 2 MG tablet Take 1-2 mg by mouth 4 (four) times daily as needed for diarrhea or loose stools.    Marland Kitchen LORazepam (ATIVAN) 0.5 MG tablet Take 1 tablet (0.5 mg total) by mouth daily as needed for anxiety. 30 tablet 2  . omeprazole (PRILOSEC) 40 MG capsule Take 40 mg by mouth daily.    . valsartan (DIOVAN) 320 MG tablet Take 1 tablet (320 mg total) by mouth daily. 90 tablet 3   No current facility-administered medications on file prior to visit.   Review of Systems  Constitutional: Negative for unusual diaphoresis or night sweats HENT: Negative for ringing in ear or discharge Eyes: Negative for double vision or worsening visual disturbance.  Respiratory:  Negative for choking and stridor.   Gastrointestinal: Negative for vomiting or other signifcant bowel change Genitourinary: Negative for hematuria or change in urine volume.  Musculoskeletal: Negative for other MSK pain or swelling Skin: Negative for color change and worsening wound.  Neurological: Negative for tremors and numbness other than noted  Psychiatric/Behavioral: Negative for decreased concentration or agitation other than above       Objective:   Physical Exam BP 130/84 mmHg  Pulse 80  Temp(Src) 98.4 F (36.9 C) (Oral)  Ht 5\' 4"  (1.626 m)  Wt 208 lb (94.348 kg)  BMI 35.69 kg/m2  SpO2 97% VS noted,  Constitutional: Pt appears in no significant distress HENT: Head: NCAT.  Right Ear: External ear normal.  Left Ear: External ear normal.  Eyes: . Pupils are equal, round, and reactive to light. Conjunctivae and EOM are normal Neck: Normal range of motion. Neck supple.  Cardiovascular: Normal rate and regular rhythm.   Pulmonary/Chest: Effort normal and breath sounds without rales or wheezing.  Abd:  Soft, NT, ND, + BS Neurological: Pt is alert. Not confused , motor grossly intact Skin: Skin is warm. No rash, no LE edema Psychiatric: Pt behavior is normal. No agitation. mild nerovus    Assessment & Plan:

## 2014-07-14 NOTE — Assessment & Plan Note (Signed)
stable overall by history and exam, recent data reviewed with pt, and pt to continue medical treatment as before,  to f/u any worsening symptoms or concerns Lab Results  Component Value Date   HGBA1C 7.1* 04/12/2014

## 2014-07-14 NOTE — Progress Notes (Signed)
Pre visit review using our clinic review tool, if applicable. No additional management support is needed unless otherwise documented below in the visit note. 

## 2014-07-14 NOTE — Assessment & Plan Note (Signed)
symtomatically improved, cont to follow

## 2014-07-14 NOTE — Patient Instructions (Addendum)
Please continue all other medications as before, and refills have been done if requested.  Please have the pharmacy call with any other refills you may need.  Please keep your appointments with your specialists as you may have planned  Please return in 3 months, or sooner if needed, with Lab testing done 3-5 days before

## 2014-07-24 ENCOUNTER — Ambulatory Visit (INDEPENDENT_AMBULATORY_CARE_PROVIDER_SITE_OTHER): Payer: Medicare Other | Admitting: Internal Medicine

## 2014-07-24 ENCOUNTER — Encounter: Payer: Self-pay | Admitting: Internal Medicine

## 2014-07-24 VITALS — BP 138/72 | HR 93 | Ht 64.0 in | Wt 208.0 lb

## 2014-07-24 DIAGNOSIS — R05 Cough: Secondary | ICD-10-CM | POA: Diagnosis not present

## 2014-07-24 DIAGNOSIS — R058 Other specified cough: Secondary | ICD-10-CM

## 2014-07-24 NOTE — Progress Notes (Signed)
Subjective:    Patient ID: Laura [name], female    DOB: 01/29/1945    MRN: [id number]    Brief patient profile:  33 yobf never smoker seasonal rhinitis spring = fall better since age  70-40s then cough started around 2012 and has waxed and waned ever since so self referred to pulmonary clinic 06/26/2014 for chronic/ recurrent cough having seen [facility] for OSA previously    History of Present Illness  06/26/2014 1st  visit/ [name]   Chief Complaint  Patient presents with  . Pulmonary Consult    Self referred for cough. Pt c/o dry persistant hacking cough. States it can't last for several minutes before stopping.  chronic cough x at least 2 y prior to OV , happens  2-3 days per week, very violent when coughing/ prev rxn to ? benicar  3-10 sec at a time/ sporadic/ dry more than wet/ day > noct - on prn prilosec 20 mg / water helps, uses cough drops and not better.    Kouffman Reflux v Neurogenic Cough Differentiator Reflux Comments  Do you awaken from a sound sleep coughing violently?                            With trouble breathing? Rarely    Do you have choking episodes when you cannot  Get enough air, gasping for air ?              No    Do you usually cough when you lie down into  The bed, or when you just lie down to rest ?                          no   Do you usually cough after meals or eating?         no   Do you cough when (or after) you bend over?    No    GERD SCORE     Kouffman Reflux v Neurogenic Cough Differentiator Neurogenic   Do you more-or-less cough all day long? sporadic   Does change of temperature make you cough? Not sure    Does laughing or chuckling cause you to cough? No    Do fumes (perfume, automobile fumes, burned  Toast, etc.,) cause you to cough ?      smoke   Does speaking, singing, or talking on the phone cause you to cough   ?               Singing    Neurogenic/Airway score      rec sinus CT> neg Try prilosec 40 mg (2 x 20)   Take 30-60 min before  first meal of the day and Pepcid ac (famotidine) 20 mg one bedtime until cough is completely gone for at least a week without the need for cough suppression GERD diet   07/24/2014 f/u ov/[name] re: cough x 2012  Chief Complaint  Patient presents with  . Follow-up    Cough has improved some and is non prod. She has noticed some abd pain since starting omeprazole.    Not able to take ppi at the higher dose due to "abd griping" better at the lower dose /  dry, better if drinks water No longer having violent coughs/ much less frequent and only last  A few secs sev times a week./ never noct or related to eating  No obvious day to day or daytime variability or assoc sob or cp or chest tightness, subjective wheeze or overt sinus or hb symptoms. No unusual exp hx or h/o childhood pna/ asthma or knowledge of premature birth.  Sleeping ok without nocturnal  or early am exacerbation  of respiratory  c/o's or need for noct saba. Also denies any obvious fluctuation of symptoms with weather or environmental changes or other aggravating or alleviating factors except as outlined above   Current Medications, Allergies, Complete Past Medical History, Past Surgical History, Family History, and Social History were reviewed in [employer] record.  ROS  The following are not active complaints unless bolded sore throat, dysphagia, dental problems, itching, sneezing,  nasal congestion or excess/ purulent secretions, ear ache,   fever, chills, sweats, unintended wt loss, classically pleuritic or exertional cp, hemoptysis,  orthopnea pnd or leg swelling, presyncope, palpitations, abdominal pain, anorexia, nausea, vomiting, diarrhea  or change in bowel or bladder habits, change in stools or urine, dysuria,hematuria,  rash, arthralgias, visual complaints, headache, numbness, weakness or ataxia or problems with walking or coordination,  change in mood/affect or memory.                 Objective:     Physical Exam  amb bf nad    07/24/2014        208   Wt Readings from Last 3 Encounters:  06/26/14 180 lb (81.647 kg)  06/13/14 209 lb 2 oz (94.858 kg)  05/02/14 206 lb 2.2 oz (93.504 kg)    Vital signs reviewed   HEENT: nl dentition, turbinates, and orophanx. Nl external ear canals without cough reflex   NECK :  without JVD/Nodes/TM/ nl carotid upstrokes bilaterally   LUNGS: no acc muscle use, clear to A and P bilaterally without cough on insp or exp maneuvers   CV:  RRR  no s3 or murmur or increase in P2, no edema   ABD:  soft and nontender with nl excursion in the supine position. No bruits or organomegaly, bowel sounds nl  MS:  warm without deformities, calf tenderness, cyanosis or clubbing  SKIN: warm and dry without lesions    NEURO:  alert, approp, no deficits      I personally reviewed images and agree with radiology impression as follows:  Sinus CT 07/06/14 > neg         Assessment & Plan:

## 2014-07-24 NOTE — Patient Instructions (Addendum)
Try prilosec otc 20mg   Take 30-60 min before first meal of the day and Pepcid ac (famotidine) 20 mg one @  bedtime until return   GERD (REFLUX)  is an extremely common cause of respiratory symptoms just like yours , many times with no obvious heartburn at all.    It can be treated with medication, but also with lifestyle changes including elevation of the head of your bed (ideally with 6 inch  bed blocks),  Smoking cessation, avoidance of late meals, excessive alcohol, and avoid fatty foods, chocolate, peppermint, colas, red wine, and acidic juices such as orange juice.  NO MINT OR MENTHOL PRODUCTS SO NO COUGH DROPS  USE SUGARLESS CANDY INSTEAD (Jolley ranchers or Stover's or Life Savers) or even ice chips will also do - the key is to swallow to prevent all throat clearing. NO OIL BASED VITAMINS - use powdered substitutes.   Keep your appt with the sleep medicine doctor and return here as needed

## 2014-07-25 ENCOUNTER — Ambulatory Visit: Payer: Medicare Other | Attending: Specialist | Admitting: Physical Therapy

## 2014-07-25 ENCOUNTER — Encounter: Payer: Self-pay | Admitting: Internal Medicine

## 2014-07-25 DIAGNOSIS — M25512 Pain in left shoulder: Secondary | ICD-10-CM | POA: Insufficient documentation

## 2014-07-25 DIAGNOSIS — M25612 Stiffness of left shoulder, not elsewhere classified: Secondary | ICD-10-CM | POA: Insufficient documentation

## 2014-07-25 DIAGNOSIS — E669 Obesity, unspecified: Secondary | ICD-10-CM | POA: Insufficient documentation

## 2014-07-25 NOTE — Assessment & Plan Note (Signed)
-   Sinus CT 07/06/2014 > neg  I had an extended final summary discussion with the patient reviewing all relevant studies completed to date and  lasting 15 to 20 minutes of a 25 minute visit on the following issues:    1) cough is much less frequent with the simple recs we made last ov strongly supporting dx of uacs with gerd/ unfortunately intol of high dose ppi so needs to stay on low doses plus hs h2  2) The standardized cough guidelines published in Chest by Lissa Morales in 2006 are still the best available and consist of a multiple step process (up to 12!) , not a single office visit,  and are intended  to address this problem logically,  with an alogrithm dependent on response to empiric treatment at  each progressive step  to determine a specific diagnosis with  minimal addtional testing needed. Therefore if adherence is an issue or can't be accurately verified,  it's very unlikely the standard evaluation and treatment will be successful here.    Furthermore, response to therapy (other than acute cough suppression, which should only be used short term with avoidance of narcotic containing cough syrups if possible), can be a gradual process for which the patient may perceive immediate benefit.  Unlike going to an eye doctor where the best perscription is almost always the first one and is immediately effective, this is almost never the case in the management of chronic cough syndromes. Therefore the patient needs to commit up front to consistently adhere to recommendations  for up to 6 weeks of therapy directed at the likely underlying problem(s) before the response can be reasonably evaluated.   3) Each maintenance medication was reviewed in detail including most importantly the difference between maintenance and as needed and under what circumstances the prns are to be used.  Please see instructions for details which were reviewed in writing and the patient given a copy.    Pulmonary f/u can be  prn worse cough

## 2014-07-25 NOTE — Assessment & Plan Note (Signed)
Body mass index is 35.69 kg/(m^2).  Lab Results  Component Value Date   TSH 2.34 15/80/6386    Complicated by osa/ hbp / prob  gerd   needs to achieve and maintain neg calorie balance >f/u primary care

## 2014-07-25 NOTE — Therapy (Signed)
[facility] [place], [place], [address] Phone: [phone]   Fax:  [phone]  Physical Therapy Evaluation  Patient Details  Name: Laura [name] MRN: [id number] Date of Birth: [birth year] Referring Provider:  Jessy Oto, MD  Encounter Date: 07/25/2014      PT End of Session - 07/25/14 1219    Visit Number 1   Number of Visits 18   Date for PT Re-Evaluation 09/19/14   PT Start Time 1039   PT Stop Time 1122   PT Time Calculation (min) 43 min   Behavior During Therapy [facility] for tasks assessed/performed      Past Medical History  Diagnosis Date  . Diabetes mellitus   . Hyperlipidemia   . Hypertension   . Anxiety   . Reflux   . Vertigo   . Arthritis     back of neck, bones spurs on neck  . Sleep apnea     wears CPAP occasionally  . Cervical disc disease   . Neuropathy     Past Surgical History  Procedure Laterality Date  . Bladder surgery    . Cholecystectomy    . Partial hysterectomy    . Fibroids removed      breast (both breasts)  . Breast surgery      There were no vitals filed for this visit.  Visit Diagnosis:  Left shoulder pain - Plan: PT plan of care cert/re-cert  Shoulder stiffness, left - Plan: PT plan of care cert/re-cert      Subjective Assessment - 07/25/14 1154    Subjective My shoulder hurts a lot at times.   Patient Stated Goals Get out of pain.   Currently in Pain? Yes   Pain Score 6    Pain Location Shoulder   Pain Orientation Left   Pain Descriptors / Indicators Constant;Aching   Pain Type Chronic pain   Pain Onset More than a month ago   Pain Frequency Constant   Aggravating Factors  Lifting left UE; attempting to sleep on left side.            [facility] PT Assessment - 07/25/14 0001    Assessment   Medical Diagnosis Left shoulder G-H OA; mild adhesive capsulits.   Precautions   Precautions None   Restrictions   Weight Bearing Restrictions No   Balance Screen    Has the patient fallen in the past 6 months No   Has the patient had a decrease in activity level because of a fear of falling?  No   Is the patient reluctant to leave their home because of a fear of falling?  No   Home Ec[employer] residence   Prior Function   Level of Independence Independent   Cognition   Overall Cognitive Status Within Functional Limits for tasks assessed   Posture/Postural Control   Posture/Postural Control Postural limitations   Postural Limitations Rounded Shoulders;Forward head   ROM / Strength   AROM / PROM / Strength AROM;Strength   AROM   Overall AROM Comments Left shoulder flexion= 108 degrees; ER= 27 degrees; IR= full.   Strength   Overall Strength Comments Left shoulder strength= 3+ to 4-/5.   Palpation   Palpation comment Tender over left anterior shoulder and ACJ.   Special Tests    Special Tests --  Pain reproduction with left shoulder Impingement test.  [facility] Adult PT Treatment/Exercise - 2014-08-15 0001    Modalities   Modalities Electrical Stimulation   Electrical Stimulation   Electrical Stimulation Location Pre-mod e stim x 15 minutes to patients left shoulder at 80-150 HZ                  PT Short Term Goals - 08/15/2014 1234    PT SHORT TERM GOAL #1   Title Pt will be I with HEP   Time 2   Period Weeks   Status New           PT Long Term Goals - 08/15/2014 1234    PT LONG TERM GOAL #1   Title Ind with an advanced HEP.   Time 6   Period Weeks   Status New   PT LONG TERM GOAL #2   Title Left active shoulder flexion= 145 degrees+.   Time 6   Period Weeks   PT LONG TERM GOAL #3   Title ER= 70 degrees+   Time 6   Period Weeks   Status New   PT LONG TERM GOAL #4   Title Left shoulder strength= 4+/5.   Time 6   Period Weeks   Status New   PT LONG TERM GOAL #5   Title Perform ADL's with pain not > 3/10.   Time 6   Period Weeks   Status New                Plan - 15-Aug-2014 1221    Clinical Impression Statement The patient reports ongoing and worseneing left shoulder over the last year.  She had an injection which helped somewhat.  Her current pain-level is a 6/10.  Lifting her left arm and attempting to sleep on her left side increases her pain.   Pt will benefit from skilled therapeutic intervention in order to improve on the following deficits Pain;Decreased activity tolerance;Decreased range of motion;Decreased strength   Rehab Potential Good   PT Frequency 3x / week   PT Duration 6 weeks   PT Treatment/Interventions ADLs/Self Care Home Management;Cryotherapy;Electrical Stimulation;Moist Heat;Ultrasound;Therapeutic activities;Therapeutic exercise;Neuromuscular re-education;Patient/family education;Manual techniques;Passive range of motion   PT Next Visit Plan Left shoulder range of motion exercises and modalities PRN.  1-1 stretching.          G-Codes - 2014-08-15 1236    Functional Assessment Tool Used FOTO.   Functional Limitation Mobility: Walking and moving around   Mobility: Walking and Moving Around Current Status [phone]) At least 40 percent but less than 60 percent impaired, limited or restricted   Mobility: Walking and Moving Around Goal Status [phone]) At least 20 percent but less than 40 percent impaired, limited or restricted       Problem List Patient Active Problem List   Diagnosis Date Noted  . Morbid obesity 08/15/2014  . Orthostatic hypotension 07/07/2014  . Upper airway cough syndrome 06/26/2014  . Diarrhea 05/02/2014  . Dizziness 04/12/2014  . Sinusitis, acute 03/15/2014  . Neuropathy   . Rash and nonspecific skin eruption 08/30/2013  . Paresthesia of both feet 08/30/2013  . Blurred vision, bilateral 02/15/2013  . Eye pain 02/15/2013  . Sinusitis, chronic 10/07/2012  . Left shoulder pain 04/15/2012  . Preventative health care 10/22/2011  . Cervical disc disease 10/22/2011  . Bilateral hand  pain 08/05/2011  . Reflux 06/29/2011  . Bloating 06/29/2011  . Back pain 03/13/2011  . Vertigo 01/04/2011  . Nystagmus 01/04/2011  . Insomnia 10/21/2010  . CONSTIPATION 08/17/2009  .  VITAMIN D DEFICIENCY 05/15/2009  . DYSPNEA 03/13/2009  . Essential hypertension, benign 01/30/2009  . DEPRESSION 01/16/2009  . Irritable bowel syndrome 01/16/2009  . OSTEOARTHRITIS 12/15/2008  . Diabetes 11/03/2008  . Hyperlipidemia 11/03/2008  . Anxiety state 11/03/2008  . OVERACTIVE BLADDER 11/03/2008  . OSA (obstructive sleep apnea) 11/03/2008  . MURMUR 11/03/2008    APPLEGATE, [place] MPT 07/25/2014, 12:39 PM  [facility] [address] [place], [place], [address] Phone: [phone]   Fax:  [phone]

## 2014-07-27 ENCOUNTER — Ambulatory Visit: Payer: Medicare Other | Admitting: Physical Therapy

## 2014-07-27 ENCOUNTER — Encounter: Payer: Self-pay | Admitting: Physical Therapy

## 2014-07-27 DIAGNOSIS — M25612 Stiffness of left shoulder, not elsewhere classified: Secondary | ICD-10-CM | POA: Diagnosis not present

## 2014-07-27 DIAGNOSIS — M25512 Pain in left shoulder: Secondary | ICD-10-CM

## 2014-07-27 DIAGNOSIS — M47812 Spondylosis without myelopathy or radiculopathy, cervical region: Secondary | ICD-10-CM

## 2014-07-27 NOTE — Therapy (Signed)
[facility] [place], [place], [address] Phone: [phone]   Fax:  [phone]  Physical Therapy Treatment  Patient Details  Name: Laura [name] MRN: [id number] Date of Birth: 06-Mar-1945 Referring Provider:  Biagio Borg, MD  Encounter Date: 07/27/2014      PT End of Session - 07/27/14 1045    Visit Number 2   Number of Visits 18   Date for PT Re-Evaluation 09/19/14   PT Start Time 8299   PT Stop Time 1123   PT Time Calculation (min) 43 min   Activity Tolerance Patient tolerated treatment well   Behavior During Therapy [name] for tasks assessed/performed      Past Medical History  Diagnosis Date  . Diabetes mellitus   . Hyperlipidemia   . Hypertension   . Anxiety   . Reflux   . Vertigo   . Arthritis     back of neck, bones spurs on neck  . Sleep apnea     wears CPAP occasionally  . Cervical disc disease   . Neuropathy     Past Surgical History  Procedure Laterality Date  . Bladder surgery    . Cholecystectomy    . Partial hysterectomy    . Fibroids removed      breast (both breasts)  . Breast surgery      There were no vitals filed for this visit.  Visit Diagnosis:  Left shoulder pain  Shoulder stiffness, left  Cervical spondylosis without myelopathy  Pain in joint, shoulder region, left      Subjective Assessment - 07/27/14 1039    Subjective States that her shoulder is locking up and has given her trouble and pain. States that pain is from L shoulder into    Patient Stated Goals Get out of pain.   Currently in Pain? Yes   Pain Score 4    Pain Location Shoulder   Pain Orientation Left   Pain Descriptors / Indicators Sore   Pain Type Chronic pain   Pain Onset More than a month ago   Pain Frequency Constant            [facility] PT Assessment - 07/27/14 0001    Assessment   Medical Diagnosis Left shoulder G-H OA; mild adhesive capsulits.   Next MD Visit 08/2014                      [facility] Adult PT Treatment/Exercise - 07/27/14 0001    Exercises   Exercises Shoulder   Shoulder Exercises: Supine   Protraction AAROM;20 reps;Other (comment)  Chest press   Flexion AAROM;20 reps   Shoulder Exercises: Seated   External Rotation AAROM;Left;20 reps   Shoulder Exercises: Pulleys   Flexion Other (comment)  x4 min   Other Pulley Exercises UE ranger flex/circles x20 reps each  cervical spine in neutral   Other Pulley Exercises Wall slides x20 reps  cervical spine in neutral   Modalities   Modalities Electrical Stimulation;Cryotherapy   Cryotherapy   Number Minutes Cryotherapy 15 Minutes   Cryotherapy Location Shoulder   Type of Cryotherapy Ice pack   Electrical Stimulation   Electrical Stimulation Location L shoulder   Electrical Stimulation Action Pre-Mod   Electrical Stimulation Parameters 80-150 Hz x15 min   Electrical Stimulation Goals Pain   Manual Therapy   Manual Therapy Passive ROM;Joint mobilization   Joint Mobilization L shoulder G I-II glenohumeral joint mobilizations to decrease pain    Passive ROM  PROM of L shoulder into flex/scap/ER/IR with gentle holds at end range in supine                  PT Short Term Goals - 07/25/14 1234    PT SHORT TERM GOAL #1   Title Pt will be I with HEP   Time 2   Period Weeks   Status New           PT Long Term Goals - 07/25/14 1234    PT LONG TERM GOAL #1   Title Ind with an advanced HEP.   Time 6   Period Weeks   Status New   PT LONG TERM GOAL #2   Title Left active shoulder flexion= 145 degrees+.   Time 6   Period Weeks   PT LONG TERM GOAL #3   Title ER= 70 degrees+   Time 6   Period Weeks   Status New   PT LONG TERM GOAL #4   Title Left shoulder strength= 4+/5.   Time 6   Period Weeks   Status New   PT LONG TERM GOAL #5   Title Perform ADL's with pain not > 3/10.   Time 6   Period Weeks   Status New               Plan - 07/27/14 1109     Clinical Impression Statement Patient tolerated treatment fine and all exercises were attempted to be completed with a neutral cervical spine in efforts to avoid cervical pain and discomfort. Light AAROM exercises completed today secondary to patient's pain. Expressed feeling pain during PROM of L shoulder into ER along the anterior shoulder. Also expressed that during AAROM flexion that she felt a cramping sensation from the anteriomedial forearm rising into anteriomedial humerus. Grade I-II L glenohumeral joint mobilizations completed in efforts to decrease L shoulder pain. Normal modalties response noted following removal of the modalties. Experienced achiness in the L posterior and anterior shoulder 4/10.   Pt will benefit from skilled therapeutic intervention in order to improve on the following deficits Pain;Decreased activity tolerance;Decreased range of motion;Decreased strength   Rehab Potential Good   PT Frequency 3x / week   PT Duration 6 weeks   PT Treatment/Interventions ADLs/Self Care Home Management;Cryotherapy;Electrical Stimulation;Moist Heat;Ultrasound;Therapeutic activities;Therapeutic exercise;Neuromuscular re-education;Patient/family education;Manual techniques;Passive range of motion   PT Next Visit Plan Continue ROM, modalities, 1-1 stretching per MPT POC.   Consulted and Agree with Plan of Care Patient        Problem List Patient Active Problem List   Diagnosis Date Noted  . Morbid obesity 07/25/2014  . Orthostatic hypotension 07/07/2014  . Upper airway cough syndrome 06/26/2014  . Diarrhea 05/02/2014  . Dizziness 04/12/2014  . Sinusitis, acute 03/15/2014  . Neuropathy   . Rash and nonspecific skin eruption 08/30/2013  . Paresthesia of both feet 08/30/2013  . Blurred vision, bilateral 02/15/2013  . Eye pain 02/15/2013  . Sinusitis, chronic 10/07/2012  . Left shoulder pain 04/15/2012  . Preventative health care 10/22/2011  . Cervical disc disease 10/22/2011  .  Bilateral hand pain 08/05/2011  . Reflux 06/29/2011  . Bloating 06/29/2011  . Back pain 03/13/2011  . Vertigo 01/04/2011  . Nystagmus 01/04/2011  . Insomnia 10/21/2010  . CONSTIPATION 08/17/2009  . VITAMIN D DEFICIENCY 05/15/2009  . DYSPNEA 03/13/2009  . Essential hypertension, benign 01/30/2009  . DEPRESSION 01/16/2009  . Irritable bowel syndrome 01/16/2009  . OSTEOARTHRITIS 12/15/2008  . Diabetes 11/03/2008  . Hyperlipidemia  11/03/2008  . Anxiety state 11/03/2008  . OVERACTIVE BLADDER 11/03/2008  . OSA (obstructive sleep apnea) 11/03/2008  . MURMUR 11/03/2008    Wynelle Fanny, PTA 07/27/2014, 11:26 AM  [facility] [address] [place], [place], [address] Phone: [phone]   Fax:  [phone]

## 2014-07-31 ENCOUNTER — Ambulatory Visit: Payer: Self-pay | Admitting: Pulmonary Disease

## 2014-07-31 ENCOUNTER — Ambulatory Visit: Payer: Medicare Other | Admitting: Physical Therapy

## 2014-07-31 DIAGNOSIS — M25612 Stiffness of left shoulder, not elsewhere classified: Secondary | ICD-10-CM

## 2014-07-31 DIAGNOSIS — M25512 Pain in left shoulder: Secondary | ICD-10-CM | POA: Diagnosis not present

## 2014-07-31 NOTE — Therapy (Signed)
[facility] [place], [place], [address] Phone: [phone]   Fax:  [phone]  Physical Therapy Treatment  Patient Details  Name: Laura [name] MRN: [id number] Date of Birth: 1945-08-29 Referring Provider:  Biagio Borg, MD  Encounter Date: 07/31/2014      PT End of Session - 07/31/14 0949    Visit Number 3   Number of Visits 18   Date for PT Re-Evaluation 09/19/14   PT Start Time 0949   PT Stop Time 1048   PT Time Calculation (min) 59 min   Activity Tolerance Patient tolerated treatment well   Behavior During Therapy [facility] for tasks assessed/performed      Past Medical History  Diagnosis Date  . Diabetes mellitus   . Hyperlipidemia   . Hypertension   . Anxiety   . Reflux   . Vertigo   . Arthritis     back of neck, bones spurs on neck  . Sleep apnea     wears CPAP occasionally  . Cervical disc disease   . Neuropathy     Past Surgical History  Procedure Laterality Date  . Bladder surgery    . Cholecystectomy    . Partial hysterectomy    . Fibroids removed      breast (both breasts)  . Breast surgery      There were no vitals filed for this visit.  Visit Diagnosis:  Left shoulder pain  Shoulder stiffness, left      Subjective Assessment - 07/31/14 0951    Subjective Patient reports she has had to take 500 mg Ibuprofen at night for the pain to be bearable. She is also using ice at night as well. Pain was in her left neck and armpit.   Currently in Pain? Yes   Pain Score 4    Pain Location Shoulder   Pain Orientation Left   Pain Descriptors / Indicators Aching   Pain Type Chronic pain                         [facility] Adult PT Treatment/Exercise - 07/31/14 0001    Shoulder Exercises: Pulleys   Flexion Other (comment)  x4 min   Other Pulley Exercises UE ranger flex x 5 (shoulder started to stiffen up)/circles x20 reps each, pain with cournter clockwise.  cervical spine in  neutral   Other Pulley Exercises Wall ladder x 10 to #24   Modalities   Modalities Electrical Stimulation;Ultrasound   Electrical Stimulation   Electrical Stimulation Location L shoulder and Teres at axilla   Electrical Stimulation Action IFC   Electrical Stimulation Parameters 80-150 hz x 15 min   Electrical Stimulation Goals Pain   Ultrasound   Ultrasound Location Left teres/lats at axilla   Ultrasound Parameters 1.5 wcm2 1 Mz cont x 10 min   Ultrasound Goals Pain  spasm   Manual Therapy   Manual Therapy Soft tissue mobilization;Passive ROM   Soft tissue mobilization left teres, lats, subscapularis with TP release with and without active shoulder flexion   Passive ROM left shoulder flex, ER                  PT Short Term Goals - 07/25/14 1234    PT SHORT TERM GOAL #1   Title Pt will be I with HEP   Time 2   Period Weeks   Status New           PT Long  Term Goals - 07/25/14 1234    PT LONG TERM GOAL #1   Title Ind with an advanced HEP.   Time 6   Period Weeks   Status New   PT LONG TERM GOAL #2   Title Left active shoulder flexion= 145 degrees+.   Time 6   Period Weeks   PT LONG TERM GOAL #3   Title ER= 70 degrees+   Time 6   Period Weeks   Status New   PT LONG TERM GOAL #4   Title Left shoulder strength= 4+/5.   Time 6   Period Weeks   Status New   PT LONG TERM GOAL #5   Title Perform ADL's with pain not > 3/10.   Time 6   Period Weeks   Status New               Plan - 07/31/14 1448    Clinical Impression Statement Patient experienced increased pain with shoulder flexion ROM activities including wall ladder and UE ranger. Patient demonstrated increased spasm and trigger points in her left lats and teres. She responded well to [place] and STW in this area and reported only slight discomfort at end of treatment.    PT Next Visit Plan continue [place] and STW to lats/teres PRN. 1-1 stretching.   Consulted and Agree with Plan of Care Patient         Problem List Patient Active Problem List   Diagnosis Date Noted  . Morbid obesity 07/25/2014  . Orthostatic hypotension 07/07/2014  . Upper airway cough syndrome 06/26/2014  . Diarrhea 05/02/2014  . Dizziness 04/12/2014  . Sinusitis, acute 03/15/2014  . Neuropathy   . Rash and nonspecific skin eruption 08/30/2013  . Paresthesia of both feet 08/30/2013  . Blurred vision, bilateral 02/15/2013  . Eye pain 02/15/2013  . Sinusitis, chronic 10/07/2012  . Left shoulder pain 04/15/2012  . Preventative health care 10/22/2011  . Cervical disc disease 10/22/2011  . Bilateral hand pain 08/05/2011  . Reflux 06/29/2011  . Bloating 06/29/2011  . Back pain 03/13/2011  . Vertigo 01/04/2011  . Nystagmus 01/04/2011  . Insomnia 10/21/2010  . CONSTIPATION 08/17/2009  . VITAMIN D DEFICIENCY 05/15/2009  . DYSPNEA 03/13/2009  . Essential hypertension, benign 01/30/2009  . DEPRESSION 01/16/2009  . Irritable bowel syndrome 01/16/2009  . OSTEOARTHRITIS 12/15/2008  . Diabetes 11/03/2008  . Hyperlipidemia 11/03/2008  . Anxiety state 11/03/2008  . OVERACTIVE BLADDER 11/03/2008  . OSA (obstructive sleep apnea) 11/03/2008  . MURMUR 11/03/2008    Madelyn Flavors PT  07/31/2014, 3:01 PM  [facility] [address] [place], [place], [address] Phone: [phone]   Fax:  [phone]

## 2014-08-02 ENCOUNTER — Encounter: Payer: Self-pay | Admitting: Physical Therapy

## 2014-08-04 ENCOUNTER — Ambulatory Visit: Payer: Medicare Other | Admitting: *Deleted

## 2014-08-04 ENCOUNTER — Encounter: Payer: Self-pay | Admitting: *Deleted

## 2014-08-04 DIAGNOSIS — M25612 Stiffness of left shoulder, not elsewhere classified: Secondary | ICD-10-CM

## 2014-08-04 DIAGNOSIS — M25512 Pain in left shoulder: Secondary | ICD-10-CM | POA: Diagnosis not present

## 2014-08-04 NOTE — Therapy (Signed)
[facility] [place], [place], [address] Phone: [phone]   Fax:  [phone]  Physical Therapy Treatment  Patient Details  Name: Laura [name] MRN: [id number] Date of Birth: 06/14/1945 Referring Provider:  Biagio Borg, MD  Encounter Date: 08/04/2014      PT End of Session - 08/04/14 0955    Visit Number 4   Number of Visits 18   Date for PT Re-Evaluation 09/19/14   PT Start Time 0952   PT Stop Time 4315   PT Time Calculation (min) 47 min      Past Medical History  Diagnosis Date  . Diabetes mellitus   . Hyperlipidemia   . Hypertension   . Anxiety   . Reflux   . Vertigo   . Arthritis     back of neck, bones spurs on neck  . Sleep apnea     wears CPAP occasionally  . Cervical disc disease   . Neuropathy     Past Surgical History  Procedure Laterality Date  . Bladder surgery    . Cholecystectomy    . Partial hysterectomy    . Fibroids removed      breast (both breasts)  . Breast surgery      There were no vitals filed for this visit.  Visit Diagnosis:  Left shoulder pain  Shoulder stiffness, left  Pain in joint, shoulder region, left                       [facility] Adult PT Treatment/Exercise - 08/04/14 0001    Shoulder Exercises: Pulleys   Flexion Other (comment)  x4 min   Other Pulley Exercises UE ranger flexion, circles each way all 2x10  cervical spine in neutral   Electrical Stimulation   Electrical Stimulation Location L shoulder and Teres at axilla IFC x 15 mins 80-150hz    Electrical Stimulation Goals Pain   Ultrasound   Ultrasound Location LT shldr posteriolateral aspect 1.5 w/cm2 x 10 mins in sitting   Ultrasound Goals Pain   Manual Therapy   Manual Therapy Soft tissue mobilization;Passive ROM   Soft tissue mobilization left teres, lats, subscapularis with TP release with and without active shoulder flexion                  PT Short Term Goals -  07/25/14 1234    PT SHORT TERM GOAL #1   Title Pt will be I with HEP   Time 2   Period Weeks   Status New           PT Long Term Goals - 07/25/14 1234    PT LONG TERM GOAL #1   Title Ind with an advanced HEP.   Time 6   Period Weeks   Status New   PT LONG TERM GOAL #2   Title Left active shoulder flexion= 145 degrees+.   Time 6   Period Weeks   PT LONG TERM GOAL #3   Title ER= 70 degrees+   Time 6   Period Weeks   Status New   PT LONG TERM GOAL #4   Title Left shoulder strength= 4+/5.   Time 6   Period Weeks   Status New   PT LONG TERM GOAL #5   Title Perform ADL's with pain not > 3/10.   Time 6   Period Weeks   Status New  Plan - 08/04/14 0956    Clinical Impression Statement Pt did very well with Rx today without any increased pain or spasms in LT shldr. She still had notable tightness and TP's in posterior aspect of LT shldr musculature that decreased with [place] and STW. .Current goals are on-going    Pt will benefit from skilled therapeutic intervention in order to improve on the following deficits Pain;Decreased activity tolerance;Decreased range of motion;Decreased strength   Rehab Potential Good   PT Frequency 3x / week   PT Duration 6 weeks   PT Treatment/Interventions ADLs/Self Care Home Management;Cryotherapy;Electrical Stimulation;Moist Heat;Ultrasound;Therapeutic activities;Therapeutic exercise;Neuromuscular re-education;Patient/family education;Manual techniques;Passive range of motion   PT Next Visit Plan continue [place] and STW to lats/teres PRN. 1-1 stretching.        Problem List Patient Active Problem List   Diagnosis Date Noted  . Morbid obesity 07/25/2014  . Orthostatic hypotension 07/07/2014  . Upper airway cough syndrome 06/26/2014  . Diarrhea 05/02/2014  . Dizziness 04/12/2014  . Sinusitis, acute 03/15/2014  . Neuropathy   . Rash and nonspecific skin eruption 08/30/2013  . Paresthesia of both feet 08/30/2013  .  Blurred vision, bilateral 02/15/2013  . Eye pain 02/15/2013  . Sinusitis, chronic 10/07/2012  . Left shoulder pain 04/15/2012  . Preventative health care 10/22/2011  . Cervical disc disease 10/22/2011  . Bilateral hand pain 08/05/2011  . Reflux 06/29/2011  . Bloating 06/29/2011  . Back pain 03/13/2011  . Vertigo 01/04/2011  . Nystagmus 01/04/2011  . Insomnia 10/21/2010  . CONSTIPATION 08/17/2009  . VITAMIN D DEFICIENCY 05/15/2009  . DYSPNEA 03/13/2009  . Essential hypertension, benign 01/30/2009  . DEPRESSION 01/16/2009  . Irritable bowel syndrome 01/16/2009  . OSTEOARTHRITIS 12/15/2008  . Diabetes 11/03/2008  . Hyperlipidemia 11/03/2008  . Anxiety state 11/03/2008  . OVERACTIVE BLADDER 11/03/2008  . OSA (obstructive sleep apnea) 11/03/2008  . MURMUR 11/03/2008    Alen Matheson,CHRIS, PTA 08/04/2014, 11:04 AM  [facility] [address] [place], [place], [address] Phone: [phone]   Fax:  [phone]

## 2014-08-07 ENCOUNTER — Ambulatory Visit: Payer: Medicare Other | Admitting: Physical Therapy

## 2014-08-07 DIAGNOSIS — M25612 Stiffness of left shoulder, not elsewhere classified: Secondary | ICD-10-CM

## 2014-08-07 DIAGNOSIS — M25512 Pain in left shoulder: Secondary | ICD-10-CM

## 2014-08-07 NOTE — Therapy (Signed)
[facility] [place], [place], [address] Phone: [phone]   Fax:  [phone]  Physical Therapy Treatment  Patient Details  Name: Laura [name] MRN: [id number] Date of Birth: 12-Oct-1945 Referring Provider:  Biagio Borg, MD  Encounter Date: 08/07/2014      PT End of Session - 08/07/14 1657    Visit Number 5   Number of Visits 18   Date for PT Re-Evaluation 09/19/14   PT Start Time 0400   PT Stop Time 0513   PT Time Calculation (min) 73 min   Activity Tolerance Patient tolerated treatment well      Past Medical History  Diagnosis Date  . Diabetes mellitus   . Hyperlipidemia   . Hypertension   . Anxiety   . Reflux   . Vertigo   . Arthritis     back of neck, bones spurs on neck  . Sleep apnea     wears CPAP occasionally  . Cervical disc disease   . Neuropathy     Past Surgical History  Procedure Laterality Date  . Bladder surgery    . Cholecystectomy    . Partial hysterectomy    . Fibroids removed      breast (both breasts)  . Breast surgery      There were no vitals filed for this visit.  Visit Diagnosis:  Left shoulder pain  Shoulder stiffness, left      Subjective Assessment - 08/07/14 1618    Subjective Doing pretty good today but was tired over the weekend.  I scrubed my floors.   Pain Score 4    Pain Location Shoulder   Pain Orientation Left   Pain Descriptors / Indicators Aching   Pain Type Chronic pain   Pain Onset More than a month ago   Pain Frequency Constant                                   PT Short Term Goals - 07/25/14 1234    PT SHORT TERM GOAL #1   Title Pt will be I with HEP   Time 2   Period Weeks   Status New           PT Long Term Goals - 07/25/14 1234    PT LONG TERM GOAL #1   Title Ind with an advanced HEP.   Time 6   Period Weeks   Status New   PT LONG TERM GOAL #2   Title Left active shoulder flexion= 145 degrees+.   Time 6   Period Weeks   PT LONG TERM GOAL #3   Title ER= 70 degrees+   Time 6   Period Weeks   Status New   PT LONG TERM GOAL #4   Title Left shoulder strength= 4+/5.   Time 6   Period Weeks   Status New   PT LONG TERM GOAL #5   Title Perform ADL's with pain not > 3/10.   Time 6   Period Weeks   Status New               Problem List Patient Active Problem List   Diagnosis Date Noted  . Morbid obesity 07/25/2014  . Orthostatic hypotension 07/07/2014  . Upper airway cough syndrome 06/26/2014  . Diarrhea 05/02/2014  . Dizziness 04/12/2014  . Sinusitis, acute 03/15/2014  . Neuropathy   . Rash and nonspecific  skin eruption 08/30/2013  . Paresthesia of both feet 08/30/2013  . Blurred vision, bilateral 02/15/2013  . Eye pain 02/15/2013  . Sinusitis, chronic 10/07/2012  . Left shoulder pain 04/15/2012  . Preventative health care 10/22/2011  . Cervical disc disease 10/22/2011  . Bilateral hand pain 08/05/2011  . Reflux 06/29/2011  . Bloating 06/29/2011  . Back pain 03/13/2011  . Vertigo 01/04/2011  . Nystagmus 01/04/2011  . Insomnia 10/21/2010  . CONSTIPATION 08/17/2009  . VITAMIN D DEFICIENCY 05/15/2009  . DYSPNEA 03/13/2009  . Essential hypertension, benign 01/30/2009  . DEPRESSION 01/16/2009  . Irritable bowel syndrome 01/16/2009  . OSTEOARTHRITIS 12/15/2008  . Diabetes 11/03/2008  . Hyperlipidemia 11/03/2008  . Anxiety state 11/03/2008  . OVERACTIVE BLADDER 11/03/2008  . OSA (obstructive sleep apnea) 11/03/2008  . MURMUR 11/03/2008   Treatment:  Pulleys x 10 minutes UBE x 10 minutes While seated:  PROM left anterior and posterior capsular stretching and ER into plane of scapula x 18 minutes.  IFC at 80-150 HZ constant x 20 minutes to patient's left shoulder.  Marijke Guadiana, [place] MPT 08/07/2014, 5:15 PM  [facility] [address] [place], [place], [address] Phone: [phone]   Fax:  [phone]

## 2014-08-08 ENCOUNTER — Encounter: Payer: Self-pay | Admitting: Pulmonary Disease

## 2014-08-08 ENCOUNTER — Ambulatory Visit (INDEPENDENT_AMBULATORY_CARE_PROVIDER_SITE_OTHER): Payer: Medicare Other | Admitting: Pulmonary Disease

## 2014-08-08 VITALS — BP 172/82 | HR 86 | Ht 64.0 in | Wt 209.2 lb

## 2014-08-08 DIAGNOSIS — G4733 Obstructive sleep apnea (adult) (pediatric): Secondary | ICD-10-CM | POA: Diagnosis not present

## 2014-08-08 NOTE — Progress Notes (Signed)
   Subjective:    Patient ID: Laura [name], female    DOB: [birth year], 69 y.o.   MRN: [id number]  HPI  69 y.o with chronic cough for FU of obstructive sleep apnea.  Chief Complaint  Patient presents with  . Follow-up    breathing doing fine.  Patient needs new machine for sleep apnea.  Patient was given a machine a few years ago, but she was unable to use it because it caused problems with dry mouth.  Sleep Study done 2007.     [facility] pt- annual FU, sees MW for cough Got new CPAP 01/2014 - humidifier was heated & so returned it - 'fire hazard' - back to using old CPAP- does not have download ability Klonopin causes weird dreams Occ anxiety attack - q 66mnths - requiring lorazepam She is convinced that her symptoms are due to BP meds- amlodipin + ARB  NPSG 2007:  AHI 32/hr -on CPAP 15 cm , no download available for review  Review of Systems neg for any significant sore throat, dysphagia, itching, sneezing, nasal congestion or excess/ purulent secretions, fever, chills, sweats, unintended wt loss, pleuritic or exertional cp, hempoptysis, orthopnea pnd or change in chronic leg swelling. Also denies presyncope, palpitations, heartburn, abdominal pain, nausea, vomiting, diarrhea or change in bowel or urinary habits, dysuria,hematuria, rash, arthralgias, visual complaints, headache, numbness weakness or ataxia.     Objective:   Physical Exam  Gen. Pleasant, obese, in no distress ENT - no lesions, no post nasal drip Neck: No JVD, no thyromegaly, no carotid bruits Lungs: no use of accessory muscles, no dullness to percussion, decreased without rales or rhonchi  Cardiovascular: Rhythm regular, heart sounds  normal, no murmurs or gallops, no peripheral edema Musculoskeletal: No deformities, no cyanosis or clubbing , no tremors       Assessment & Plan:

## 2014-08-08 NOTE — Patient Instructions (Signed)
We will try to get you a new CPAP machine Need old download to confirm settings

## 2014-08-09 ENCOUNTER — Ambulatory Visit (INDEPENDENT_AMBULATORY_CARE_PROVIDER_SITE_OTHER): Payer: Medicare Other | Admitting: Internal Medicine

## 2014-08-09 ENCOUNTER — Encounter: Payer: Self-pay | Admitting: Internal Medicine

## 2014-08-09 VITALS — BP 130/90 | HR 52 | Temp 98.2°F | Ht 64.0 in | Wt 208.5 lb

## 2014-08-09 DIAGNOSIS — I10 Essential (primary) hypertension: Secondary | ICD-10-CM | POA: Diagnosis not present

## 2014-08-09 DIAGNOSIS — R42 Dizziness and giddiness: Secondary | ICD-10-CM | POA: Diagnosis not present

## 2014-08-09 DIAGNOSIS — R269 Unspecified abnormalities of gait and mobility: Secondary | ICD-10-CM | POA: Diagnosis not present

## 2014-08-09 DIAGNOSIS — H538 Other visual disturbances: Secondary | ICD-10-CM

## 2014-08-09 DIAGNOSIS — F411 Generalized anxiety disorder: Secondary | ICD-10-CM

## 2014-08-09 MED ORDER — MECLIZINE HCL 12.5 MG PO TABS: 12.5000 mg | ORAL_TABLET | Freq: Three times a day (TID) | ORAL | Status: DC | PRN

## 2014-08-09 MED ORDER — VALSARTAN 160 MG PO TABS: 160.0000 mg | ORAL_TABLET | Freq: Every day | ORAL | Status: DC

## 2014-08-09 NOTE — Telephone Encounter (Signed)
Laura [name] to see above

## 2014-08-09 NOTE — Assessment & Plan Note (Signed)
stable overall by history and exam, recent data reviewed with pt, and pt to continue medical treatment as before,  to f/u any worsening symptoms or concerns Lab Results  Component Value Date   WBC 7.3 04/12/2014   HGB 13.1 04/12/2014   HCT 38.3 04/12/2014   PLT 266.0 04/12/2014   GLUCOSE 104* 04/12/2014   CHOL 258* 04/12/2014   TRIG 196.0* 04/12/2014   HDL 53.20 04/12/2014   LDLDIRECT 168.3 02/17/2013   LDLCALC 166* 04/12/2014   ALT 15 04/12/2014   AST 15 04/12/2014   NA 138 04/12/2014   K 4.2 04/12/2014   CL 106 04/12/2014   CREATININE 0.75 04/12/2014   BUN 11 04/12/2014   CO2 29 04/12/2014   TSH 2.34 04/12/2014   HGBA1C 7.1* 04/12/2014   MICROALBUR 0.9 04/12/2014

## 2014-08-09 NOTE — Assessment & Plan Note (Addendum)
Also with staggering/gait difficutly, recent blurred vision, for Head MRI - pt declines ER eval today, also meclizine trial prn

## 2014-08-09 NOTE — Telephone Encounter (Signed)
Please advise 

## 2014-08-09 NOTE — Assessment & Plan Note (Signed)
We will try to get you a new CPAP machine Need old download to confirm settings  Weight loss encouraged, compliance with goal of at least 4-6 hrs every night is the expectation. Advised against medications with sedative side effects Cautioned against driving when sleepy - understanding that sleepiness will vary on a day to day basis

## 2014-08-09 NOTE — Progress Notes (Signed)
Subjective:    Patient ID: Laura [name], female    DOB: August 17, 1945, 69 y.o.   MRN: [id number]  HPI  Here after got up at 1am for an ice pack to the left shoulder, laid down again, but stood up with severe dizzy episode/room spinning type "like being drunk", developed severe panic attack, but took lorazepam and called sister for support, eventually went back to sleep, but then getting up again had more dizziness, staggering, difficulty walking.  Blood sugar 184 with the original dizziness. Also with close up reading difficulty worse in the past 2 wks, not worse with the dizziness itself.  Does not check BP at home.  No HA, fever, Denies urinary symptoms such as dysuria, frequency, urgency, flank pain, hematuria or n/v, fever, chills. Denies worsening reflux, abd pain, dysphagia, n/v, bowel change or blood. Past Medical History  Diagnosis Date  . Diabetes mellitus   . Hyperlipidemia   . Hypertension   . Anxiety   . Reflux   . Vertigo   . Arthritis     back of neck, bones spurs on neck  . Sleep apnea     wears CPAP occasionally  . Cervical disc disease   . Neuropathy    Past Surgical History  Procedure Laterality Date  . Bladder surgery    . Cholecystectomy    . Partial hysterectomy    . Fibroids removed      breast (both breasts)  . Breast surgery      reports that she has never smoked. She has never used smokeless tobacco. She reports that she does not drink alcohol or use illicit drugs. family history includes Diabetes in her father, maternal aunt, and sister; Hypertension in her other. There is no history of Colon cancer, Esophageal cancer, Stomach cancer, or Rectal cancer. Allergies  Allergen Reactions  . Sulfa Antibiotics     Tongue swells, hives, itching  . Codeine     itch  . Crestor [Rosuvastatin Calcium] Other (See Comments)    Did something to memory   . Hydrocodone-Homatropine   . Lipitor [Atorvastatin Calcium] Other (See Comments)    Makes weak   . Prednisone      Nervous     Current Outpatient Prescriptions on File Prior to Visit  Medication Sig Dispense Refill  . acetaminophen (TYLENOL) 325 MG tablet Take 325 mg by mouth as needed.    Marland Kitchen amLODipine (NORVASC) 5 MG tablet Take 1 tablet (5 mg total) by mouth daily. 90 tablet 3  . clonazePAM (KLONOPIN) 0.5 MG tablet Take 1 tablet by mouth as needed.    . famotidine (PEPCID) 20 MG tablet Take 20 mg by mouth 2 (two) times daily.    Marland Kitchen glimepiride (AMARYL) 1 MG tablet Take 0.5 tablets (0.5 mg total) by mouth 2 (two) times daily. 90 tablet 3  . glucose blood test strip 1 each by Other route daily. Use to check blood sugars daily Dx E11.9 50 each 11  . Lancets (ONETOUCH ULTRASOFT) lancets USE FOR CHECKING BLOOD SUGARS TWICE DAILY 100 each 3  . loperamide (IMODIUM A-D) 2 MG tablet Take 1-2 mg by mouth 4 (four) times daily as needed for diarrhea or loose stools.    Marland Kitchen LORazepam (ATIVAN) 0.5 MG tablet Take 1 tablet (0.5 mg total) by mouth daily as needed for anxiety. 30 tablet 2  . omeprazole (PRILOSEC) 20 MG capsule Take 20 mg by mouth daily.    . valsartan (DIOVAN) 320 MG tablet Take 1 tablet (320  mg total) by mouth daily. 90 tablet 3   No current facility-administered medications on file prior to visit.    Review of Systems  Constitutional: Negative for unusual diaphoresis or night sweats HENT: Negative for ringing in ear or discharge Eyes: Negative for double vision or worsening visual disturbance.  Respiratory: Negative for choking and stridor.   Gastrointestinal: Negative for vomiting or other signifcant bowel change Genitourinary: Negative for hematuria or change in urine volume.  Musculoskeletal: Negative for other MSK pain or swelling Skin: Negative for color change and worsening wound.  Neurological: Negative for tremors and numbness other than noted  Psychiatric/Behavioral: Negative for decreased concentration or agitation other than above       Objective:   Physical Exam BP 130/90 mmHg   Pulse 52  Temp(Src) 98.2 F (36.8 C) (Oral)  Ht 5\' 4"  (1.626 m)  Wt 208 lb 8 oz (94.575 kg)  BMI 35.77 kg/m2  SpO2 97% VS noted,  Constitutional: Pt appears in no significant distress HENT: Head: NCAT.  Right Ear: External ear normal.  Left Ear: External ear normal.  Eyes: . Pupils are equal, round, and reactive to light. Conjunctivae and EOM are normal Neck: Normal range of motion. Neck supple.  Cardiovascular: Normal rate and regular rhythm.   Pulmonary/Chest: Effort normal and breath sounds without rales or wheezing.  Abd:  Soft, NT, ND, + BS, no flank tender Neurological: Pt is alert. Not confused , motor grossly intact, sens/dtr intact Skin: Skin is warm. No rash, no LE edema Psychiatric: Pt behavior is normal. No agitation. 1+ nervous     Assessment & Plan:

## 2014-08-09 NOTE — Assessment & Plan Note (Addendum)
Ok to decrase the diovan to 160 mg for now to see if leads to less symtpoms, o/w stable overall by history and exam, recent data reviewed with pt, and pt to continue medical treatment as before,  to f/u any worsening symptoms or concerns BP Readings from Last 3 Encounters:  08/09/14 130/90  08/08/14 172/82  07/24/14 138/72

## 2014-08-09 NOTE — Patient Instructions (Signed)
Please take all new medication as prescribed - the meclizine as needed for dizziness  OK to decrease the diovan to 160 mg to see if this helps  You will be contacted regarding the referral for: MRI for brain  Please continue all other medications as before  Please have the pharmacy call with any other refills you may need.  Please keep your appointments with your specialists as you may have planned

## 2014-08-09 NOTE — Progress Notes (Signed)
Pre visit review using our clinic review tool, if applicable. No additional management support is needed unless otherwise documented below in the visit note. 

## 2014-08-10 ENCOUNTER — Ambulatory Visit: Payer: Self-pay | Admitting: Internal Medicine

## 2014-08-10 ENCOUNTER — Encounter: Payer: Self-pay | Admitting: Internal Medicine

## 2014-08-11 ENCOUNTER — Encounter: Payer: Self-pay | Admitting: Physical Therapy

## 2014-08-11 ENCOUNTER — Encounter (HOSPITAL_COMMUNITY): Payer: Self-pay

## 2014-08-11 ENCOUNTER — Emergency Department (HOSPITAL_COMMUNITY)
Admission: EM | Admit: 2014-08-11 | Discharge: 2014-08-11 | Disposition: A | Discharge status: Home / Self Care | Payer: Medicare Other | Attending: Emergency Medicine | Admitting: Emergency Medicine

## 2014-08-11 ENCOUNTER — Emergency Department (HOSPITAL_COMMUNITY): Payer: Medicare Other

## 2014-08-11 DIAGNOSIS — Z79899 Other long term (current) drug therapy: Secondary | ICD-10-CM | POA: Insufficient documentation

## 2014-08-11 DIAGNOSIS — K219 Gastro-esophageal reflux disease without esophagitis: Secondary | ICD-10-CM | POA: Diagnosis not present

## 2014-08-11 DIAGNOSIS — Z9981 Dependence on supplemental oxygen: Secondary | ICD-10-CM | POA: Diagnosis not present

## 2014-08-11 DIAGNOSIS — R42 Dizziness and giddiness: Secondary | ICD-10-CM | POA: Diagnosis not present

## 2014-08-11 DIAGNOSIS — E785 Hyperlipidemia, unspecified: Secondary | ICD-10-CM | POA: Diagnosis not present

## 2014-08-11 DIAGNOSIS — I10 Essential (primary) hypertension: Secondary | ICD-10-CM | POA: Insufficient documentation

## 2014-08-11 DIAGNOSIS — Z8659 Personal history of other mental and behavioral disorders: Secondary | ICD-10-CM | POA: Insufficient documentation

## 2014-08-11 DIAGNOSIS — E119 Type 2 diabetes mellitus without complications: Secondary | ICD-10-CM | POA: Diagnosis not present

## 2014-08-11 DIAGNOSIS — G4730 Sleep apnea, unspecified: Secondary | ICD-10-CM | POA: Diagnosis not present

## 2014-08-11 DIAGNOSIS — R270 Ataxia, unspecified: Secondary | ICD-10-CM | POA: Diagnosis not present

## 2014-08-11 DIAGNOSIS — M1990 Unspecified osteoarthritis, unspecified site: Secondary | ICD-10-CM | POA: Insufficient documentation

## 2014-08-11 DIAGNOSIS — R2681 Unsteadiness on feet: Secondary | ICD-10-CM | POA: Diagnosis not present

## 2014-08-11 LAB — URINALYSIS, ROUTINE W REFLEX MICROSCOPIC
Bilirubin Urine: NEGATIVE
Glucose, UA: NEGATIVE mg/dL
Hgb urine dipstick: NEGATIVE
KETONES UR: NEGATIVE mg/dL
Leukocytes, UA: NEGATIVE
Nitrite: NEGATIVE
PH: 7.5 (ref 5.0–8.0)
Protein, ur: NEGATIVE mg/dL
Specific Gravity, Urine: 1.006 (ref 1.005–1.030)
UROBILINOGEN UA: 0.2 mg/dL (ref 0.0–1.0)

## 2014-08-11 LAB — BASIC METABOLIC PANEL
Anion gap: 8 (ref 5–15)
BUN: 13 mg/dL (ref 6–20)
CO2: 24 mmol/L (ref 22–32)
Calcium: 9.9 mg/dL (ref 8.9–10.3)
Chloride: 108 mmol/L (ref 101–111)
Creatinine, Ser: 0.69 mg/dL (ref 0.44–1.00)
GFR calc Af Amer: >60 mL/min (ref >60)
Glucose, Bld: 130 mg/dL — ABNORMAL HIGH (ref 65–99)
Potassium: 4.1 mmol/L (ref 3.5–5.1)
Sodium: 140 mmol/L (ref 135–145)

## 2014-08-11 LAB — CBC
HEMATOCRIT: 38.5 % (ref 36.0–46.0)
Hemoglobin: 13.2 g/dL (ref 12.0–15.0)
MCH: 29.2 pg (ref 26.0–34.0)
MCHC: 34.3 g/dL (ref 30.0–36.0)
MCV: 85.2 fL (ref 78.0–100.0)
Platelets: 260 10*3/uL (ref 150–400)
RBC: 4.52 MIL/uL (ref 3.87–5.11)
RDW: 13.2 % (ref 11.5–15.5)
WBC: 9.8 10*3/uL (ref 4.0–10.5)

## 2014-08-11 LAB — CBG MONITORING, ED: GLUCOSE-CAPILLARY: 113 mg/dL — AB (ref 65–99)

## 2014-08-11 MED ORDER — MECLIZINE HCL 25 MG PO TABS
25.0000 mg | ORAL_TABLET | Freq: Once | ORAL | Status: AC
  Administered 2014-08-11: 25 mg via ORAL
  Filled 2014-08-11: qty 1

## 2014-08-11 MED ORDER — LORAZEPAM 2 MG/ML IJ SOLN
1.0000 mg | Freq: Once | INTRAMUSCULAR | Status: AC
  Administered 2014-08-11: 1 mg via INTRAVENOUS
  Filled 2014-08-11: qty 1

## 2014-08-11 NOTE — ED Notes (Signed)
Pt has had dizziness with unsteady gait since Tuesday.  Went to MD on Wednesday and symptoms were felt to be coming from  Her bp meds.  Pt was to decrease her dosage.  Pt told if did not get better to come to ED.  Symptoms remain.  Nausea with no vomiting.  No fever. No change in urination.  Slight cough.

## 2014-08-11 NOTE — ED Provider Notes (Signed)
CSN: [id number]     Arrival date & time 08/11/14  1334 History   First MD Initiated Contact with Patient 08/11/14 1359     Chief Complaint  Patient presents with  . Dizziness     (Consider location/radiation/quality/duration/timing/severity/associated sxs/prior Treatment) HPI Comments: Patient with a history of Vertigo, HTN, Hyperlipidemia, and DM presents today with complaints of loss of balance and dizziness.  She reports that the difficulty ambulating has been present for the past 3 days and the dizziness has been intermittent over the past 3 days.  She describes the dizziness as feeling like the room is spinning.  She denies any fall or head trauma.  She was seen by her PCP for this two days ago and was given a Rx for Meclizine.  However, she states that the never had the prescription filled.  She reports a history of Vertigo, but states that this feels different.  She reports that she has never had the difficulty ambulating in the past.  She reports blurred vision of both eyes, but denies double vision.  She denies headache, fever, chills, focal weakness, numbness, tingling, facial asymmetry, difficulty speaking, or difficulty swallowing.  Patient is a 69 y.o. female presenting with dizziness. The history is provided by the patient.  Dizziness   Past Medical History  Diagnosis Date  . Diabetes mellitus   . Hyperlipidemia   . Hypertension   . Anxiety   . Reflux   . Vertigo   . Arthritis     back of neck, bones spurs on neck  . Sleep apnea     wears CPAP occasionally  . Cervical disc disease   . Neuropathy    Past Surgical History  Procedure Laterality Date  . Bladder surgery    . Cholecystectomy    . Partial hysterectomy    . Fibroids removed      breast (both breasts)  . Breast surgery     Family History  Problem Relation Age of Onset  . Hypertension Other   . Diabetes Father   . Diabetes Sister   . Diabetes Maternal Aunt   . Colon cancer Neg Hx   . Esophageal  cancer Neg Hx   . Stomach cancer Neg Hx   . Rectal cancer Neg Hx    History  Substance Use Topics  . Smoking status: Never Smoker   . Smokeless tobacco: Never Used  . Alcohol Use: No   OB History    No data available     Review of Systems  Neurological: Positive for dizziness.  All other systems reviewed and are negative.     Allergies  Sulfa antibiotics; Codeine; Crestor; Hydrocodone-homatropine; Lipitor; and Prednisone  Home Medications   Prior to Admission medications   Medication Sig Start Date End Date Taking? Authorizing Provider  acetaminophen (TYLENOL) 325 MG tablet Take 325 mg by mouth as needed.    Historical Provider, MD  amLODipine (NORVASC) 5 MG tablet Take 1 tablet (5 mg total) by mouth daily. 07/07/14   Biagio Borg, MD  clonazePAM (KLONOPIN) 0.5 MG tablet Take 1 tablet by mouth as needed. 04/11/14   Historical Provider, MD  famotidine (PEPCID) 20 MG tablet Take 20 mg by mouth 2 (two) times daily.    Historical Provider, MD  glimepiride (AMARYL) 1 MG tablet Take 0.5 tablets (0.5 mg total) by mouth 2 (two) times daily. 07/01/12   Biagio Borg, MD  glucose blood test strip 1 each by Other route daily. Use to check blood  sugars daily Dx E11.9 02/09/14   Biagio Borg, MD  Lancets [facility] ULTRASOFT) lancets USE FOR CHECKING BLOOD SUGARS TWICE DAILY 11/07/13   Biagio Borg, MD  loperamide (IMODIUM A-D) 2 MG tablet Take 1-2 mg by mouth 4 (four) times daily as needed for diarrhea or loose stools.    Historical Provider, MD  LORazepam (ATIVAN) 0.5 MG tablet Take 1 tablet (0.5 mg total) by mouth daily as needed for anxiety. 04/19/13   Biagio Borg, MD  meclizine (ANTIVERT) 12.5 MG tablet Take 1 tablet (12.5 mg total) by mouth 3 (three) times daily as needed for dizziness. 08/09/14   Biagio Borg, MD  omeprazole (PRILOSEC) 20 MG capsule Take 20 mg by mouth daily.    Historical Provider, MD  valsartan (DIOVAN) 160 MG tablet Take 1 tablet (160 mg total) by mouth daily. 08/09/14    Biagio Borg, MD   BP 153/82 mmHg  Pulse 82  Temp(Src) 97.9 F (36.6 C) (Oral)  Resp 18  SpO2 99% Physical Exam  Constitutional: She appears well-developed and well-nourished.  HENT:  Head: Normocephalic and atraumatic.  Eyes: EOM are normal. Pupils are equal, round, and reactive to light.  Neck: Normal range of motion. Neck supple.  Cardiovascular: Normal rate, regular rhythm and normal heart sounds.   Pulmonary/Chest: Effort normal and breath sounds normal.  Musculoskeletal: Normal range of motion.  Neurological: She is alert. She has normal strength. No cranial nerve deficit or sensory deficit. Coordination normal.  Ataxia with ambulation Normal finger to nose testing Normal rapid alternating movements Normal heel to shin testing   Skin: Skin is warm and dry.  Psychiatric: She has a normal mood and affect.  Nursing note and vitals reviewed.   ED Course  Procedures (including critical care time) Labs Review Labs Reviewed  BASIC METABOLIC PANEL  CBC  URINALYSIS, ROUTINE W REFLEX MICROSCOPIC (NOT AT [facility])  CBG MONITORING, ED    Imaging Review Mr Brain Wo Contrast  08/11/2014   CLINICAL DATA:  Ataxia. Dizziness and unsteady gait beginning 3 days ago.  EXAM: MRI HEAD WITHOUT CONTRAST  TECHNIQUE: Multiplanar, multiecho pulse sequences of the brain and surrounding structures were obtained without intravenous contrast.  COMPARISON:  01/04/2011  FINDINGS: There is no evidence of acute infarct, intracranial hemorrhage, mass, midline shift, or extra-axial fluid collection. Ventricles and sulci are within normal limits for age. Small foci of T2 hyperintensity in the cerebral white matter bilaterally, most notably in the left periatrial white matter, are similar to the prior study and nonspecific but compatible with minimal chronic small vessel ischemic disease.  Orbits are unremarkable. No significant inflammatory disease is seen in the paranasal sinuses or mastoid air cells. Major  intracranial vascular flow voids are preserved.  IMPRESSION: 1. No acute intracranial abnormality. 2. Minimal chronic small vessel ischemic disease.   Electronically Signed   By: Logan Bores   On: 08/11/2014 16:21     EKG Interpretation   Date/Time:  Friday August 11 2014 14:03:51 EDT Ventricular Rate:  71 PR Interval:  153 QRS Duration: 84 QT Interval:  380 QTC Calculation: 413 R Axis:   53 Text Interpretation:  Sinus arrhythmia Baseline wander in lead(s) II III  aVF Sinus rhythm Artifact Abnormal ekg \\E \ Confirmed by Carmin Muskrat   MD ([id number]) on 08/11/2014 2:11:18 PM      MDM   Final diagnoses:  None   Patient presents today with complaints of dizziness.  She reports that dizziness worsens with ambulation and  also that she is having difficulty with balance while ambulating.  Normal neuro exam aside from some ataxia with ambulation.  Labs unremarkable.  MRI brain negative for acute findings.  Feel that the patient is stable for discharge.  Patient given referral to Neurology.  Return precautions given.  Patient also evaluated by Dr. Vanita Panda who is in agreement with the plan.        Hyman Bible, PA-C 08/12/14 1902  Carmin Muskrat, MD 08/13/14 [phone]

## 2014-08-11 NOTE — Discharge Instructions (Signed)

## 2014-08-12 ENCOUNTER — Encounter: Payer: Self-pay | Admitting: Internal Medicine

## 2014-08-14 ENCOUNTER — Encounter: Payer: Self-pay | Admitting: Internal Medicine

## 2014-08-15 ENCOUNTER — Encounter: Payer: Self-pay | Admitting: Internal Medicine

## 2014-08-15 ENCOUNTER — Ambulatory Visit (INDEPENDENT_AMBULATORY_CARE_PROVIDER_SITE_OTHER): Payer: Medicare Other | Admitting: Internal Medicine

## 2014-08-15 VITALS — BP 124/86 | HR 80 | Temp 97.8°F | Ht 64.0 in | Wt 208.0 lb

## 2014-08-15 DIAGNOSIS — E1140 Type 2 diabetes mellitus with diabetic neuropathy, unspecified: Secondary | ICD-10-CM

## 2014-08-15 DIAGNOSIS — I714 Abdominal aortic aneurysm, without rupture, unspecified: Secondary | ICD-10-CM

## 2014-08-15 DIAGNOSIS — R270 Ataxia, unspecified: Secondary | ICD-10-CM | POA: Diagnosis not present

## 2014-08-15 DIAGNOSIS — R42 Dizziness and giddiness: Secondary | ICD-10-CM

## 2014-08-15 DIAGNOSIS — I10 Essential (primary) hypertension: Secondary | ICD-10-CM | POA: Diagnosis not present

## 2014-08-15 NOTE — Assessment & Plan Note (Signed)
Etiology unclear except prob peripheral I suspect, but also with some ataxic symptoms, ok for neurology referral

## 2014-08-15 NOTE — Progress Notes (Signed)
Pre visit review using our clinic review tool, if applicable. No additional management support is needed unless otherwise documented below in the visit note. 

## 2014-08-15 NOTE — Progress Notes (Signed)
Subjective:    Patient ID: Laura [name], female    DOB: December 14, 1945, 69 y.o.   MRN: [id number]  HPI  Pt here to f/u, actually denies dizziness, but felt staggery and off balance/ataxic, recent MRI neg for acute 7/29, routine labs as documented neg, and has been referred to neurology, though I dont see the referral documented as being done.  Meclizine has helped the dizziness Pt denies chest pain, increased sob or doe, wheezing, orthopnea, PND, increased LE swelling, palpitations, dizziness or syncope.   Pt denies polydipsia, polyuria,  Pt denies fever, wt loss, night sweats, loss of appetite, or other constitutional symptoms  Past Medical History  Diagnosis Date  . Diabetes mellitus   . Hyperlipidemia   . Hypertension   . Anxiety   . Reflux   . Vertigo   . Arthritis     back of neck, bones spurs on neck  . Sleep apnea     wears CPAP occasionally  . Cervical disc disease   . Neuropathy    Past Surgical History  Procedure Laterality Date  . Bladder surgery    . Cholecystectomy    . Partial hysterectomy    . Fibroids removed      breast (both breasts)  . Breast surgery      reports that she has never smoked. She has never used smokeless tobacco. She reports that she does not drink alcohol or use illicit drugs. family history includes Diabetes in her father, maternal aunt, and sister; Hypertension in her other. There is no history of Colon cancer, Esophageal cancer, Stomach cancer, or Rectal cancer. Allergies  Allergen Reactions  . Sulfa Antibiotics     Tongue swells, hives, itching  . Codeine     itch  . Crestor [Rosuvastatin Calcium] Other (See Comments)    Did something to memory   . Hydrocodone-Homatropine Other (See Comments)    Vertigo *pt strongly prefers to never take*  . Lipitor [Atorvastatin Calcium] Other (See Comments)    Makes weak   . Prednisone Other (See Comments)    Nervous *pt strongly prefers to never be given prednisone*    Current Outpatient  Prescriptions on File Prior to Visit  Medication Sig Dispense Refill  . acetaminophen (TYLENOL) 325 MG tablet Take 325 mg by mouth as needed for mild pain, moderate pain, fever or headache.     Marland Kitchen amLODipine (NORVASC) 5 MG tablet Take 1 tablet (5 mg total) by mouth daily. 90 tablet 3  . clonazePAM (KLONOPIN) 0.5 MG tablet Take 0.5 mg by mouth at bedtime as needed for anxiety (sleep).     Marland Kitchen glimepiride (AMARYL) 1 MG tablet Take 0.5 tablets (0.5 mg total) by mouth 2 (two) times daily. 90 tablet 3  . glucose blood test strip 1 each by Other route daily. Use to check blood sugars daily Dx E11.9 50 each 11  . Lancets (ONETOUCH ULTRASOFT) lancets USE FOR CHECKING BLOOD SUGARS TWICE DAILY 100 each 3  . loperamide (IMODIUM A-D) 2 MG tablet Take 2 mg by mouth daily as needed for diarrhea or loose stools.     Marland Kitchen LORazepam (ATIVAN) 0.5 MG tablet Take 1 tablet (0.5 mg total) by mouth daily as needed for anxiety. 30 tablet 2  . meclizine (ANTIVERT) 12.5 MG tablet Take 1 tablet (12.5 mg total) by mouth 3 (three) times daily as needed for dizziness. 30 tablet 1  . OVER THE COUNTER MEDICATION Apply 1 application topically daily as needed (pain).    Marland Kitchen  psyllium (METAMUCIL) 58.6 % powder Take 1 packet by mouth daily.    . valsartan (DIOVAN) 160 MG tablet Take 1 tablet (160 mg total) by mouth daily. 90 tablet 3  . famotidine (PEPCID) 20 MG tablet Take 20 mg by mouth daily as needed for heartburn or indigestion.     Marland Kitchen omeprazole (PRILOSEC) 20 MG capsule Take 20 mg by mouth daily.     No current facility-administered medications on file prior to visit.   Review of Systems  Constitutional: Negative for unusual diaphoresis or night sweats HENT: Negative for ringing in ear or discharge Eyes: Negative for double vision or worsening visual disturbance.  Respiratory: Negative for choking and stridor.   Gastrointestinal: Negative for vomiting or other signifcant bowel change Genitourinary: Negative for hematuria or  change in urine volume.  Musculoskeletal: Negative for other MSK pain or swelling Skin: Negative for color change and worsening wound.  Neurological: Negative for tremors and numbness other than noted  Psychiatric/Behavioral: Negative for decreased concentration or agitation other than above       Objective:   Physical Exam BP 124/86 mmHg  Pulse 80  Temp(Src) 97.8 F (36.6 C) (Oral)  Ht 5\' 4"  (1.626 m)  Wt 208 lb (94.348 kg)  BMI 35.69 kg/m2  SpO2 97% VS noted,  Constitutional: Pt appears in no significant distress HENT: Head: NCAT.  Right Ear: External ear normal.  Left Ear: External ear normal.  Eyes: . Pupils are equal, round, and reactive to light. Conjunctivae and EOM are normal Neck: Normal range of motion. Neck supple.  Cardiovascular: Normal rate and regular rhythm.   Pulmonary/Chest: Effort normal and breath sounds without rales or wheezing.  Abd:  Soft, NT, ND, + BS Neurological: Pt is alert. Not confused , motor grossly intact Skin: Skin is warm. No rash, no LE edema Psychiatric: Pt behavior is normal. No agitation.      Assessment & Plan:

## 2014-08-15 NOTE — Patient Instructions (Signed)
Please continue all other medications as before, including the meclizine  Please have the pharmacy call with any other refills you may need.  Please continue your efforts at being more active, low cholesterol diet, and weight control.  Please keep your appointments with your specialists as you may have planned  You will be contacted regarding the referral for: neurology

## 2014-08-15 NOTE — Assessment & Plan Note (Signed)
On lower dose diovan, o/w stable overall by history and exam, recent data reviewed with pt, and pt to continue medical treatment as before,  to f/u any worsening symptoms or concerns BP Readings from Last 3 Encounters:  08/15/14 124/86  08/11/14 161/80  08/09/14 130/90

## 2014-08-15 NOTE — Assessment & Plan Note (Signed)
stable overall by history and exam, recent data reviewed with pt, and pt to continue medical treatment as before,  to f/u any worsening symptoms or concerns Lab Results  Component Value Date   HGBA1C 7.1* 04/12/2014

## 2014-08-17 ENCOUNTER — Encounter: Payer: Self-pay | Admitting: Internal Medicine

## 2014-08-28 DIAGNOSIS — H8112 Benign paroxysmal vertigo, left ear: Secondary | ICD-10-CM | POA: Diagnosis not present

## 2014-08-29 DIAGNOSIS — G4733 Obstructive sleep apnea (adult) (pediatric): Secondary | ICD-10-CM | POA: Diagnosis not present

## 2014-08-30 DIAGNOSIS — M4722 Other spondylosis with radiculopathy, cervical region: Secondary | ICD-10-CM | POA: Diagnosis not present

## 2014-08-30 DIAGNOSIS — M19012 Primary osteoarthritis, left shoulder: Secondary | ICD-10-CM | POA: Diagnosis not present

## 2014-09-04 ENCOUNTER — Ambulatory Visit (HOSPITAL_COMMUNITY): Payer: Medicare Other | Attending: Psychiatry | Admitting: Physical Therapy

## 2014-09-04 DIAGNOSIS — M25612 Stiffness of left shoulder, not elsewhere classified: Secondary | ICD-10-CM | POA: Insufficient documentation

## 2014-09-04 DIAGNOSIS — M436 Torticollis: Secondary | ICD-10-CM | POA: Diagnosis not present

## 2014-09-04 DIAGNOSIS — Z9181 History of falling: Secondary | ICD-10-CM | POA: Diagnosis not present

## 2014-09-04 DIAGNOSIS — Z7409 Other reduced mobility: Secondary | ICD-10-CM

## 2014-09-04 DIAGNOSIS — H8110 Benign paroxysmal vertigo, unspecified ear: Secondary | ICD-10-CM | POA: Diagnosis not present

## 2014-09-04 DIAGNOSIS — R269 Unspecified abnormalities of gait and mobility: Secondary | ICD-10-CM | POA: Insufficient documentation

## 2014-09-04 DIAGNOSIS — M47812 Spondylosis without myelopathy or radiculopathy, cervical region: Secondary | ICD-10-CM | POA: Insufficient documentation

## 2014-09-04 DIAGNOSIS — M25512 Pain in left shoulder: Secondary | ICD-10-CM | POA: Diagnosis not present

## 2014-09-04 DIAGNOSIS — R2681 Unsteadiness on feet: Secondary | ICD-10-CM | POA: Diagnosis not present

## 2014-09-04 NOTE — Therapy (Signed)
[place], [place], [address] Phone: [phone]   Fax:  [phone]  Physical Therapy Evaluation  Patient Details  Name: Laura [name] MRN: [id number] Date of Birth: [birth year] Referring Provider:  Collene Gobble, MD  Encounter Date: 09/04/2014      PT End of Session - 09/04/14 1444    Visit Number 1   Number of Visits 10   Date for PT Re-Evaluation 10/02/14   Authorization Type Medicare/Medicaid    Authorization Time Period 09/04/14 to 11/04/14   Authorization - Visit Number 1   Authorization - Number of Visits 10   PT Start Time 4287   PT Stop Time 1430   PT Time Calculation (min) 43 min   Activity Tolerance Patient tolerated treatment well   Behavior During Therapy [facility] for tasks assessed/performed      Past Medical History  Diagnosis Date  . Diabetes mellitus   . Hyperlipidemia   . Hypertension   . Anxiety   . Reflux   . Vertigo   . Arthritis     back of neck, bones spurs on neck  . Sleep apnea     wears CPAP occasionally  . Cervical disc disease   . Neuropathy     Past Surgical History  Procedure Laterality Date  . Bladder surgery    . Cholecystectomy    . Partial hysterectomy    . Fibroids removed      breast (both breasts)  . Breast surgery      There were no vitals filed for this visit.  Visit Diagnosis:  Benign paroxysmal positional vertigo, unspecified laterality - Plan: PT plan of care cert/re-cert  Unsteadiness - Plan: PT plan of care cert/re-cert  Abnormality of gait - Plan: PT plan of care cert/re-cert  Impaired functional mobility and activity tolerance - Plan: PT plan of care cert/re-cert  Risk for falls - Plan: PT plan of care cert/re-cert  Neck stiffness - Plan: PT plan of care cert/re-cert      Subjective Assessment - 09/04/14 1352    Subjective Patient reports that she is really not dizzy but is most concerned about changes in her balance and gait that has  come with the dizziness. She can only walk short distances and before this happened again she was able to walk maybe a mile and back.    Pertinent History Patient has had the vertigo before, it started in 2012 and was debilitating; had to get PT to rehabiltate from it. For this most recent course, she was hit by a new spell of dizziness getting up from the couch. Currently taking medication to assist in managing dizziness.    How long can you stand comfortably? 5-10 minutes before feeling dangerously unsteady    How long can you walk comfortably? 40ft before feeling very unsteady    Patient Stated Goals get back to feeling like "...a normal person...", wants to get back to walking and exercise, wants to get rid of head swimming feeling, get balance back    Currently in Pain? No/denies   Pain Score --  5/10 being off balance; dizziness is 3/10             [facility] PT Assessment - 09/04/14 0001    Assessment   Medical Diagnosis BPPV, dizziness    Onset Date/Surgical Date 08/07/14  approximate    Next MD Visit no set folllow up appointments, will call Dr. Moshe Cipro when PT is done  Precautions   Precautions None   Restrictions   Weight Bearing Restrictions No   Balance Screen   Has the patient fallen in the past 6 months No   Has the patient had a decrease in activity level because of a fear of falling?  Yes   Is the patient reluctant to leave their home because of a fear of falling?  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, Scientist, research (medical), chess, going to gym, shopping    Observation/Other Assessments   Observations No dizziness during visual tracking exercises; unable to provoke vertigo with head turn with contralateral trunk flexion either way    Focus on Therapeutic Outcomes (FOTO)  34% limited    AROM    Cervical Flexion 60   Cervical Extension 39   Cervical - Right Side Bend 49   Cervical - Left Side Bend 50   Cervical - Right Rotation 70   Cervical - Left Rotation 67   Berg Balance Test   Sit to Stand Able to stand without using hands and stabilize independently   Standing Unsupported Able to stand safely 2 minutes   Sitting with Back Unsupported but Feet Supported on Floor or Stool Able to sit safely and securely 2 minutes   Stand to Sit Sits safely with minimal use of hands   Transfers Able to transfer safely, minor use of hands   Standing Unsupported with Eyes Closed Able to stand 10 seconds with supervision   Standing Ubsupported with Feet Together Able to place feet together independently and stand for 1 minute with supervision   From Standing, Reach Forward with Outstretched Arm Can reach confidently >25 cm (10")   From Standing Position, Pick up Object from [place] to pick up shoe safely and easily   From Standing Position, Turn to Look Behind Over each Shoulder Looks behind from both sides and weight shifts well   Turn 360 Degrees Able to turn 360 degrees safely one side only in 4 seconds or less   Standing Unsupported, Alternately Place Feet on Step/Stool Able to stand independently and complete 8 steps >20 seconds   Standing Unsupported, One Foot in Front Needs help to step but can hold 15 seconds   Standing on One Leg Able to lift leg independently and hold equal to or more than 3 seconds   Total Score 47   Timed Up and Go Test   TUG Comments 14, 11.75, 11.75   High Level Balance   High Level Balance Comments --                           PT Education - 09/04/14 1443    Education provided Yes   Education Details plan of care moving forward, prognosis, HEP    Person(s) Educated Patient   Methods Explanation;Demonstration;Handout   Comprehension Verbalized understanding;Need further instruction          PT Short Term Goals - 09/04/14 1451    PT  SHORT TERM GOAL #1   Title Patient will state that she consistently has no more than 2/10 dizziness and no more than 4/10 unsteadiness    Time 2   Period Weeks   Status New   PT SHORT TERM GOAL #2   Title Patient will demonstrate an improvement of at least 10 degrees on  all cervical planes with pain 0/10   Time 2   Period Weeks   Status New   PT SHORT TERM GOAL #3   Title Patient will correctly and consistently perform appropriate HEP, to be updated PRN    Time 2   Period Weeks   Status New           PT Long Term Goals - 2014/09/19 1456    PT LONG TERM GOAL #1   Title Patient will experience no more than 1/10 dizziness or unsteadiness during all functional tasks and activities    Time 5   Period Weeks   Status New   PT LONG TERM GOAL #2   Title Patient will be able to complete TUG in 8 seconds on a consistent basis    Time 5   Period Weeks   Status New   PT LONG TERM GOAL #3   Title Patient will score at least 54 on BERG balance test   Time 5   Period Weeks   Status New   PT LONG TERM GOAL #4   Title Patient to report that she has been able to return to regular light-moderate activities at the gym at least 3 days each week with no exacerbation of symptoms    Time 5   Period Weeks   Status New               Plan - 09/19/2014 1445    Clinical Impression Statement Patient presents with diagnosis of BPPV however patient's main complaint today is unsteadiness and her walking, reports that she has had a lot of close calls with stumbling and that she becomes unsteady quickly with mobility. Unable to provoke BPPV symptoms with contralateral head rotation/trunk lateral flexion, however patient did have some increased symptoms with vertical tracking and vertical saccades. Patient reports that she is frustrated that she has a hard time with activity due to her symptoms. It is possible that pattient's symptoms are somewhat muted by the medication taht her MD has given her for the  dizziness, making it harder to provoke. At this time patient will benefit from skilled PT services in order to address her impairments and assist her in reaching an optimal level of function as well as decreasing fall risk.    Pt will benefit from skilled therapeutic intervention in order to improve on the following deficits Pain;Decreased activity tolerance;Decreased range of motion;Decreased strength   Rehab Potential Good   PT Frequency 2x / week   PT Duration Other (comment)  5 weeks   PT Treatment/Interventions ADLs/Self Care Home Management;Gait training;Functional mobility training;Therapeutic activities;Balance training;Therapeutic exercise;Neuromuscular re-education;Patient/family education;Energy conservation   PT Next Visit Plan review goals and HEP; visual tracking and saccades, balance training   Consulted and Agree with Plan of Care Patient          G-Codes - 09/19/2014 1501    Functional Assessment Tool Used FOTO 34% limited    Functional Limitation Mobility: Walking and moving around   Mobility: Walking and Moving Around Current Status [phone]) At least 20 percent but less than 40 percent impaired, limited or restricted   Mobility: Walking and Moving Around Goal Status (P2951) At least 1 percent but less than 20 percent impaired, limited or restricted       Problem List Patient Active Problem List   Diagnosis Date Noted  . Dizziness and giddiness 08/09/2014  . Morbid obesity 07/25/2014  . Orthostatic hypotension 07/07/2014  . Upper airway cough syndrome 06/26/2014  .  Diarrhea 05/02/2014  . Dizziness 04/12/2014  . Neuropathy   . Rash and nonspecific skin eruption 08/30/2013  . Paresthesia of both feet 08/30/2013  . Blurred vision, bilateral 02/15/2013  . Eye pain 02/15/2013  . Sinusitis, chronic 10/07/2012  . Left shoulder pain 04/15/2012  . Preventative health care 10/22/2011  . Cervical disc disease 10/22/2011  . Bilateral hand pain 08/05/2011  . Reflux  06/29/2011  . Bloating 06/29/2011  . Back pain 03/13/2011  . Vertigo 01/04/2011  . Nystagmus 01/04/2011  . Insomnia 10/21/2010  . CONSTIPATION 08/17/2009  . VITAMIN D DEFICIENCY 05/15/2009  . DYSPNEA 03/13/2009  . Essential hypertension, benign 01/30/2009  . DEPRESSION 01/16/2009  . Irritable bowel syndrome 01/16/2009  . OSTEOARTHRITIS 12/15/2008  . Diabetes 11/03/2008  . Hyperlipidemia 11/03/2008  . Anxiety state 11/03/2008  . OVERACTIVE BLADDER 11/03/2008  . OSA (obstructive sleep apnea) 11/03/2008  . MURMUR 11/03/2008    Deniece Ree PT, DPT [phone]  [facility] [address] [place], [place], [address] Phone: [phone]   Fax:  [phone]

## 2014-09-04 NOTE — Therapy (Signed)
[place], [place], [address] Phone: [phone]   Fax:  [phone]  Patient Details  Name: Laura [name] MRN: [id number] Date of Birth: 1945-09-30 Referring Provider:  Collene Gobble, MD  Encounter Date: 09/04/2014  Patient has Medicaid as secondary insurance; Medicaid application submitted today and PT to check back for coverage of upcoming visits.    Deniece Ree PT, DPT [phone]  [facility] [address] [place], [place], [address] Phone: [phone]   Fax:  [phone]

## 2014-09-04 NOTE — Patient Instructions (Signed)
   Tandem Stance (do in hallway)  Stand with one foot directly in front of the other and weight evenly distributed between both feet. Hold balance as long as you can.   Repeat twice each side, 2-3 times per day.     SINGLE LEG STANCE - SLS (do in hallway)  Stand on one leg and maintain your balance. Hold for as long as you can and then switch feet.  Repeat three times each leg, 2-3 times per day.    Saccades - vertical    Hold 2 pens or 2 fingers in front of you about 8-10" apart. Keeping your head still, look up and down between the two objects, bringing each one into focus each time. Attempt to move as quickly as possible, making sure each object comes into focus.  Perform 10-20 times, 2 times a day.    Saccades - horizontal   Hold 2 pens or 2 fingers in front of you about 8-10" apart. Keeping your head still, look back and forth between the two objects, bringing each one into focus each time. Attempt to move as quickly as possible, making sure each object comes into focus.  Repeat 10-20 times, twice a day.

## 2014-09-07 ENCOUNTER — Ambulatory Visit (HOSPITAL_COMMUNITY): Payer: Medicare Other | Admitting: Physical Therapy

## 2014-09-07 DIAGNOSIS — Z9181 History of falling: Secondary | ICD-10-CM | POA: Diagnosis not present

## 2014-09-07 DIAGNOSIS — H8110 Benign paroxysmal vertigo, unspecified ear: Secondary | ICD-10-CM

## 2014-09-07 DIAGNOSIS — R2681 Unsteadiness on feet: Secondary | ICD-10-CM

## 2014-09-07 DIAGNOSIS — M25612 Stiffness of left shoulder, not elsewhere classified: Secondary | ICD-10-CM | POA: Diagnosis not present

## 2014-09-07 DIAGNOSIS — R269 Unspecified abnormalities of gait and mobility: Secondary | ICD-10-CM | POA: Diagnosis not present

## 2014-09-07 DIAGNOSIS — M436 Torticollis: Secondary | ICD-10-CM | POA: Diagnosis not present

## 2014-09-07 DIAGNOSIS — M25512 Pain in left shoulder: Secondary | ICD-10-CM | POA: Diagnosis not present

## 2014-09-07 DIAGNOSIS — Z7409 Other reduced mobility: Secondary | ICD-10-CM

## 2014-09-07 DIAGNOSIS — M47812 Spondylosis without myelopathy or radiculopathy, cervical region: Secondary | ICD-10-CM | POA: Diagnosis not present

## 2014-09-07 NOTE — Therapy (Signed)
[place], [place], [address] Phone: [phone]   Fax:  [phone]  Physical Therapy Treatment  Patient Details  Name: Laura [name] MRN: [id number] Date of Birth: [birth year] Referring Provider:  Biagio Borg, MD  Encounter Date: 09/07/2014      PT End of Session - 09/07/14 1801    Visit Number 2   Number of Visits 10   Date for PT Re-Evaluation 10/02/14   Authorization Type Medicare/Medicaid    Authorization Time Period 09/04/14 to 11/04/14   Authorization - Visit Number 2   Authorization - Number of Visits 10   PT Start Time 1645   PT Stop Time 1730   PT Time Calculation (min) 45 min   Activity Tolerance Patient tolerated treatment well   Behavior During Therapy [facility] for tasks assessed/performed      Past Medical History  Diagnosis Date  . Diabetes mellitus   . Hyperlipidemia   . Hypertension   . Anxiety   . Reflux   . Vertigo   . Arthritis     back of neck, bones spurs on neck  . Sleep apnea     wears CPAP occasionally  . Cervical disc disease   . Neuropathy     Past Surgical History  Procedure Laterality Date  . Bladder surgery    . Cholecystectomy    . Partial hysterectomy    . Fibroids removed      breast (both breasts)  . Breast surgery      There were no vitals filed for this visit.  Visit Diagnosis:  Benign paroxysmal positional vertigo, unspecified laterality  Unsteadiness  Abnormality of gait  Impaired functional mobility and activity tolerance  Risk for falls      Subjective Assessment - 09/07/14 1757    Subjective Pt reports compliance with HEP.  STates she really only gets "unstable" when she is walking/standing and rotating her trunk.  currently wtihout pain or dizziness.   Currently in Pain? No/denies                         [facility] Adult PT Treatment/Exercise - 09/07/14 1657    Shoulder Exercises: Supine   Other Supine Exercises tandem  stance 30" each   Other Supine Exercises SLS rt: 12", Lt: 5" max of 5 trials   Shoulder Exercises: Seated   Other Seated Exercises visual tracking in vertical, horizontal and diagonal planes   Other Seated Exercises thoracic excursion, cervical excursion 10 reps each   Shoulder Exercises: Standing   Other Standing Exercises tandem, retro, side stepping 2RT   Other Standing Exercises forward amb with UE swings and head movments Rt/Lt and up/down 1RT each   Shoulder Exercises: Pulleys   Other Pulley Exercises cone rotations in standing                  PT Short Term Goals - 09/04/14 1451    PT SHORT TERM GOAL #1   Title Patient will state that she consistently has no more than 2/10 dizziness and no more than 4/10 unsteadiness    Time 2   Period Weeks   Status New   PT SHORT TERM GOAL #2   Title Patient will demonstrate an improvement of at least 10 degrees on all cervical planes with pain 0/10   Time 2   Period Weeks   Status New   PT SHORT TERM GOAL #3   Title  Patient will correctly and consistently perform appropriate HEP, to be updated PRN    Time 2   Period Weeks   Status New           PT Long Term Goals - 09/04/14 1456    PT LONG TERM GOAL #1   Title Patient will experience no more than 1/10 dizziness or unsteadiness during all functional tasks and activities    Time 5   Period Weeks   Status New   PT LONG TERM GOAL #2   Title Patient will be able to complete TUG in 8 seconds on a consistent basis    Time 5   Period Weeks   Status New   PT LONG TERM GOAL #3   Title Patient will score at least 54 on BERG balance test   Time 5   Period Weeks   Status New   PT LONG TERM GOAL #4   Title Patient to report that she has been able to return to regular light-moderate activities at the gym at least 3 days each week with no exacerbation of symptoms    Time 5   Period Weeks   Status New               Plan - 09/07/14 1802    Clinical Impression  Statement PT given copy of evaluation with review of goals. Progressed with standing balance task today.  Seated actvities of little challenge for patient.  Ambulation with head turns increased symptoms but quickly subsided when discontinued motion.  Pt able to complete all exericses with cues.  Able to remain in tandem position 30" each LE and increase to 5" Lt LE and 12" Rt LE with SLS.  Encouraged patient to wear tennis shoes to therapy.    Pt will benefit from skilled therapeutic intervention in order to improve on the following deficits Pain;Decreased range of motion;Decreased strength   PT Duration --  5 weeks   PT Next Visit Plan Progress dynamic balance actvities.    Consulted and Agree with Plan of Care Patient        Problem List Patient Active Problem List   Diagnosis Date Noted  . Dizziness and giddiness 08/09/2014  . Morbid obesity 07/25/2014  . Orthostatic hypotension 07/07/2014  . Upper airway cough syndrome 06/26/2014  . Diarrhea 05/02/2014  . Dizziness 04/12/2014  . Neuropathy   . Rash and nonspecific skin eruption 08/30/2013  . Paresthesia of both feet 08/30/2013  . Blurred vision, bilateral 02/15/2013  . Eye pain 02/15/2013  . Sinusitis, chronic 10/07/2012  . Left shoulder pain 04/15/2012  . Preventative health care 10/22/2011  . Cervical disc disease 10/22/2011  . Bilateral hand pain 08/05/2011  . Reflux 06/29/2011  . Bloating 06/29/2011  . Back pain 03/13/2011  . Vertigo 01/04/2011  . Nystagmus 01/04/2011  . Insomnia 10/21/2010  . CONSTIPATION 08/17/2009  . VITAMIN D DEFICIENCY 05/15/2009  . DYSPNEA 03/13/2009  . Essential hypertension, benign 01/30/2009  . DEPRESSION 01/16/2009  . Irritable bowel syndrome 01/16/2009  . OSTEOARTHRITIS 12/15/2008  . Diabetes 11/03/2008  . Hyperlipidemia 11/03/2008  . Anxiety state 11/03/2008  . OVERACTIVE BLADDER 11/03/2008  . OSA (obstructive sleep apnea) 11/03/2008  . MURMUR 11/03/2008    Teena Irani,  PTA/CLT [phone]  09/07/2014, 6:07 PM  [facility] [address] [place], [place], [address] Phone: [phone]   Fax:  [phone]

## 2014-09-10 DIAGNOSIS — G4733 Obstructive sleep apnea (adult) (pediatric): Secondary | ICD-10-CM | POA: Diagnosis not present

## 2014-09-12 ENCOUNTER — Ambulatory Visit (HOSPITAL_COMMUNITY): Payer: Medicare Other

## 2014-09-12 DIAGNOSIS — Z9181 History of falling: Secondary | ICD-10-CM

## 2014-09-12 DIAGNOSIS — M25512 Pain in left shoulder: Secondary | ICD-10-CM

## 2014-09-12 DIAGNOSIS — M47812 Spondylosis without myelopathy or radiculopathy, cervical region: Secondary | ICD-10-CM

## 2014-09-12 DIAGNOSIS — M436 Torticollis: Secondary | ICD-10-CM

## 2014-09-12 DIAGNOSIS — R269 Unspecified abnormalities of gait and mobility: Secondary | ICD-10-CM

## 2014-09-12 DIAGNOSIS — H8110 Benign paroxysmal vertigo, unspecified ear: Secondary | ICD-10-CM

## 2014-09-12 DIAGNOSIS — R2681 Unsteadiness on feet: Secondary | ICD-10-CM

## 2014-09-12 DIAGNOSIS — Z7409 Other reduced mobility: Secondary | ICD-10-CM | POA: Diagnosis not present

## 2014-09-12 DIAGNOSIS — M25612 Stiffness of left shoulder, not elsewhere classified: Secondary | ICD-10-CM

## 2014-09-13 ENCOUNTER — Encounter (HOSPITAL_COMMUNITY): Payer: Self-pay

## 2014-09-13 NOTE — Patient Instructions (Signed)
Slow down HEP activities, and include more gaze stabilization to improve smooth pursuits rather than engagement of saccadic eye movements only.

## 2014-09-13 NOTE — Therapy (Signed)
[place], [place], [address] Phone: [phone]   Fax:  [phone]  Physical Therapy Treatment  Patient Details  Name: Laura [name] MRN: [id number] Date of Birth: 06-Feb-1945 Referring Provider:  Collene Gobble, MD  Encounter Date: 09/12/2014      PT End of Session - 09/13/14 1222    Visit Number 3   Number of Visits 10   Date for PT Re-Evaluation 10/02/14   Authorization Type Medicare/Medicaid    Authorization Time Period 09/04/14 to 11/04/14   Authorization - Visit Number 3   Authorization - Number of Visits 10   PT Start Time 1700   PT Stop Time 1515   PT Time Calculation (min) 38 min   Activity Tolerance Patient tolerated treatment well   Behavior During Therapy [facility] for tasks assessed/performed      Past Medical History  Diagnosis Date  . Diabetes mellitus   . Hyperlipidemia   . Hypertension   . Anxiety   . Reflux   . Vertigo   . Arthritis     back of neck, bones spurs on neck  . Sleep apnea     wears CPAP occasionally  . Cervical disc disease   . Neuropathy     Past Surgical History  Procedure Laterality Date  . Bladder surgery    . Cholecystectomy    . Partial hysterectomy    . Fibroids removed      breast (both breasts)  . Breast surgery      There were no vitals filed for this visit.  Visit Diagnosis:  Benign paroxysmal positional vertigo, unspecified laterality  Cervical spondylosis without myelopathy  Unsteadiness  Abnormality of gait  Impaired functional mobility and activity tolerance  Risk for falls  Neck stiffness  Left shoulder pain  Shoulder stiffness, left  Pain in joint, shoulder region, left      Subjective Assessment - 09/13/14 1212    Subjective Pt reporting that she isfeeling much better, still trying to improve gait distance at home, and practicing gaze stabilization activities frequently throughout the day.    Pertinent History Patient has had  the vertigo before, it started in 2012 and was debilitating; had to get PT to rehabiltate from it. For this most recent course, she was hit by a new spell of dizziness getting up from the couch. Currently taking medication to assist in managing dizziness.    Patient Stated Goals get back to feeling like "...a normal person...", wants to get back to walking and exercise, wants to get rid of head swimming feeling, get balance back    Currently in Pain? No/denies                         [facility] Adult PT Treatment/Exercise - 09/13/14 0001    Ambulation/Gait   Ambulation/Gait Yes   Ambulation Distance (Feet) 256 Feet   Assistive device None   Gait Comments 8x41ft in hallway with headturns and verbal identification of playing cards fixed to wall.    Shoulder Exercises: Supine   Other Supine Exercises tandem stance  3x45 sec bilat@ [employer]   Other Supine Exercises [employer] transfers  narrow stance on airex: 2x10 cones + trunk rotation   Shoulder Exercises: Seated   Other Seated Exercises Static head + smooth pursuits eye tracking 2x 60sec  VOR training- Gaze stabilization + head turns 2x 60sec   Other Seated Exercises VOR cancelation + headturns and conflicting  background 2x 60 seconds                PT Education - 09/13/14 1221    Education provided Yes   Education Details updates to HEP.    Person(s) Educated Patient   Methods Explanation;Demonstration   Comprehension Verbalized understanding;Returned demonstration          PT Short Term Goals - 09/04/14 1451    PT SHORT TERM GOAL #1   Title Patient will state that she consistently has no more than 2/10 dizziness and no more than 4/10 unsteadiness    Time 2   Period Weeks   Status New   PT SHORT TERM GOAL #2   Title Patient will demonstrate an improvement of at least 10 degrees on all cervical planes with pain 0/10   Time 2   Period Weeks   Status New   PT SHORT TERM GOAL #3   Title Patient will correctly  and consistently perform appropriate HEP, to be updated PRN    Time 2   Period Weeks   Status New           PT Long Term Goals - 09/04/14 1456    PT LONG TERM GOAL #1   Title Patient will experience no more than 1/10 dizziness or unsteadiness during all functional tasks and activities    Time 5   Period Weeks   Status New   PT LONG TERM GOAL #2   Title Patient will be able to complete TUG in 8 seconds on a consistent basis    Time 5   Period Weeks   Status New   PT LONG TERM GOAL #3   Title Patient will score at least 54 on BERG balance test   Time 5   Period Weeks   Status New   PT LONG TERM GOAL #4   Title Patient to report that she has been able to return to regular light-moderate activities at the gym at least 3 days each week with no exacerbation of symptoms    Time 5   Period Weeks   Status New               Plan - 09/13/14 1225    Clinical Impression Statement Pt continues to make progress toward goals as evidence by improved walking quality and distance at home and improved tolerance to therapy today. Pt continues to struggle with unsteadiness in gait when turning her head, and will continue to benefit from skilled PT intervention to forward her progress.    Pt will benefit from skilled therapeutic intervention in order to improve on the following deficits Pain;Decreased range of motion;Decreased strength   Rehab Potential Good   PT Frequency 2x / week   PT Treatment/Interventions ADLs/Self Care Home Management;Gait training;Functional mobility training;Therapeutic activities;Balance training;Therapeutic exercise;Neuromuscular re-education;Patient/family education;Energy conservation   PT Next Visit Plan Progress dynamic balance actvities.    Consulted and Agree with Plan of Care Patient        Problem List Patient Active Problem List   Diagnosis Date Noted  . Dizziness and giddiness 08/09/2014  . Morbid obesity 07/25/2014  . Orthostatic hypotension  07/07/2014  . Upper airway cough syndrome 06/26/2014  . Diarrhea 05/02/2014  . Dizziness 04/12/2014  . Neuropathy   . Rash and nonspecific skin eruption 08/30/2013  . Paresthesia of both feet 08/30/2013  . Blurred vision, bilateral 02/15/2013  . Eye pain 02/15/2013  . Sinusitis, chronic 10/07/2012  . Left shoulder pain 04/15/2012  .  Preventative health care 10/22/2011  . Cervical disc disease 10/22/2011  . Bilateral hand pain 08/05/2011  . Reflux 06/29/2011  . Bloating 06/29/2011  . Back pain 03/13/2011  . Vertigo 01/04/2011  . Nystagmus 01/04/2011  . Insomnia 10/21/2010  . CONSTIPATION 08/17/2009  . VITAMIN D DEFICIENCY 05/15/2009  . DYSPNEA 03/13/2009  . Essential hypertension, benign 01/30/2009  . DEPRESSION 01/16/2009  . Irritable bowel syndrome 01/16/2009  . OSTEOARTHRITIS 12/15/2008  . Diabetes 11/03/2008  . Hyperlipidemia 11/03/2008  . Anxiety state 11/03/2008  . OVERACTIVE BLADDER 11/03/2008  . OSA (obstructive sleep apnea) 11/03/2008  . MURMUR 11/03/2008    Marilin Kofman C 09/13/2014, 2:44 PM  2:44 PM  Etta Grandchild, PT, DPT [place] License # [id number]       [facility] [address] [place], [place], [address] Phone: [phone]   Fax:  [phone]

## 2014-09-14 ENCOUNTER — Encounter (HOSPITAL_COMMUNITY): Payer: Self-pay

## 2014-09-19 ENCOUNTER — Encounter (HOSPITAL_COMMUNITY): Payer: Self-pay

## 2014-09-19 ENCOUNTER — Ambulatory Visit (HOSPITAL_COMMUNITY): Payer: Medicare Other | Attending: Psychiatry

## 2014-09-19 DIAGNOSIS — Z9181 History of falling: Secondary | ICD-10-CM | POA: Diagnosis not present

## 2014-09-19 DIAGNOSIS — R269 Unspecified abnormalities of gait and mobility: Secondary | ICD-10-CM | POA: Insufficient documentation

## 2014-09-19 DIAGNOSIS — M25512 Pain in left shoulder: Secondary | ICD-10-CM | POA: Insufficient documentation

## 2014-09-19 DIAGNOSIS — M47812 Spondylosis without myelopathy or radiculopathy, cervical region: Secondary | ICD-10-CM | POA: Insufficient documentation

## 2014-09-19 DIAGNOSIS — R2681 Unsteadiness on feet: Secondary | ICD-10-CM | POA: Diagnosis not present

## 2014-09-19 DIAGNOSIS — M436 Torticollis: Secondary | ICD-10-CM | POA: Insufficient documentation

## 2014-09-19 DIAGNOSIS — H8110 Benign paroxysmal vertigo, unspecified ear: Secondary | ICD-10-CM | POA: Diagnosis not present

## 2014-09-19 DIAGNOSIS — Z7409 Other reduced mobility: Secondary | ICD-10-CM | POA: Insufficient documentation

## 2014-09-19 DIAGNOSIS — M25612 Stiffness of left shoulder, not elsewhere classified: Secondary | ICD-10-CM | POA: Insufficient documentation

## 2014-09-19 NOTE — Patient Instructions (Signed)
  VOR x 1 viewing  - horizontal   Hold a card with a small letter on it in front of you. Focus on one small letter. Turn your head left and right, keeping your eyes focused on the letter. Attempt to turn your head left/right as quickly as possible WHILE keeping the letter in focus. If the letter becomes blurry, slow down.     Perform 5x for 45-60 seconds, and 3 times daily.

## 2014-09-19 NOTE — Therapy (Signed)
[place], [place], [address] Phone: [phone]   Fax:  [phone]  Physical Therapy Treatment  Patient Details  Name: Laura [name] MRN: [id number] Date of Birth: 1945/07/08 Referring Provider:  Biagio Borg, MD  Encounter Date: 09/19/2014      PT End of Session - 09/19/14 1443    Visit Number 4   Number of Visits 10   Date for PT Re-Evaluation 10/02/14   Authorization Type Medicare/Medicaid    Authorization Time Period 09/04/14 to 11/04/14   Authorization - Visit Number 4   Authorization - Number of Visits 10   PT Start Time 5038   PT Stop Time 1432   PT Time Calculation (min) 39 min   Equipment Utilized During Treatment Other (comment)  Deck of cards.    Activity Tolerance Patient tolerated treatment well   Behavior During Therapy [facility] for tasks assessed/performed      Past Medical History  Diagnosis Date  . Diabetes mellitus   . Hyperlipidemia   . Hypertension   . Anxiety   . Reflux   . Vertigo   . Arthritis     back of neck, bones spurs on neck  . Sleep apnea     wears CPAP occasionally  . Cervical disc disease   . Neuropathy     Past Surgical History  Procedure Laterality Date  . Bladder surgery    . Cholecystectomy    . Partial hysterectomy    . Fibroids removed      breast (both breasts)  . Breast surgery      There were no vitals filed for this visit.  Visit Diagnosis:  Benign paroxysmal positional vertigo, unspecified laterality  Pain in joint, shoulder region, left  Cervical spondylosis without myelopathy  Unsteadiness  Abnormality of gait  Impaired functional mobility and activity tolerance  Risk for falls  Neck stiffness  Left shoulder pain  Shoulder stiffness, left      Subjective Assessment - 09/19/14 1354    Subjective Pt reporting additional concerns with contnued feelings of heaviness of body, especially after lying down for prolonged periods. Pt is  concerned that her cervical stenosis issues are contributing to her difficulty with walking. Pt reports continued reluctance to leave house due to current condition.    Pertinent History Patient has had the vertigo before, it started in 2012 and was debilitating; had to get PT to rehabiltate from it. For this most recent course, she was hit by a new spell of dizziness getting up from the couch. Currently taking medication to assist in managing dizziness.    Patient Stated Goals get back to feeling like "...a normal person...", wants to get back to walking and exercise, wants to get rid of head swimming feeling, get balance back    Currently in Pain? No/denies                         [facility] Adult PT Treatment/Exercise - 09/19/14 0001    Ambulation/Gait   Ambulation/Gait Yes   Ambulation Distance (Feet) 375 Feet  73ftx15   Assistive device None   Gait Comments hallway repeats c headturns, emphasis on turn left and steady gait.   6x hallway c headturns for card identifcation on wall   Shoulder Exercises: Seated   Other Seated Exercises Static head + smooth pursuits eye tracking 3x45sec  VOR training- Gaze stabilization + head turns 3x45sec  PT Education - 09/19/14 1441    Education provided Yes   Education Details Importance of staying actice in lifestyle and avoiding isolation in home. Pt encouraged to continue to work on [employer] activities, as she was stil working on old activities (fast saccadic movements)    Person(s) Educated Patient   Methods Explanation;Demonstration   Comprehension Verbalized understanding;Returned demonstration          PT Short Term Goals - 09/04/14 1451    PT SHORT TERM GOAL #1   Title Patient will state that she consistently has no more than 2/10 dizziness and no more than 4/10 unsteadiness    Time 2   Period Weeks   Status New   PT SHORT TERM GOAL #2   Title Patient will demonstrate an improvement of at least 10  degrees on all cervical planes with pain 0/10   Time 2   Period Weeks   Status New   PT SHORT TERM GOAL #3   Title Patient will correctly and consistently perform appropriate HEP, to be updated PRN    Time 2   Period Weeks   Status New           PT Long Term Goals - 09/04/14 1456    PT LONG TERM GOAL #1   Title Patient will experience no more than 1/10 dizziness or unsteadiness during all functional tasks and activities    Time 5   Period Weeks   Status New   PT LONG TERM GOAL #2   Title Patient will be able to complete TUG in 8 seconds on a consistent basis    Time 5   Period Weeks   Status New   PT LONG TERM GOAL #3   Title Patient will score at least 54 on BERG balance test   Time 5   Period Weeks   Status New   PT LONG TERM GOAL #4   Title Patient to report that she has been able to return to regular light-moderate activities at the gym at least 3 days each week with no exacerbation of symptoms    Time 5   Period Weeks   Status New               Plan - 09/19/14 1444    Clinical Impression Statement Pt reoprts that she is improving in c/o vertiginous symptoms and ambulation distance, but remains overwhelmingly concerned with consistent feelings of heaviness throughout body when she first inittiates movementl. Pt making progress in tolerance to headturns with smooth eye pursuits and straight plan ambulation, but remains relatively unstable with abrupt, exaggerated head turns. Pt continues to demonstrate about 30-40 seconds of tolerance of VOR practice,, before smooth persuits are consistently interupted with saccadic movements , worse so in the L eye than the right.  Treatment should continue to focus on improving stable gait with abrupt headturns.   Pt will benefit from skilled therapeutic intervention in order to improve on the following deficits Pain;Decreased range of motion;Decreased strength   Rehab Potential Good   PT Frequency 2x / week   PT  Treatment/Interventions ADLs/Self Care Home Management;Gait training;Functional mobility training;Therapeutic activities;Balance training;Therapeutic exercise;Neuromuscular re-education;Patient/family education;Energy conservation   PT Next Visit Plan Treatment should continue to focus on improving stable gait with abrupt headturns.   Consulted and Agree with Plan of Care Patient        Problem List Patient Active Problem List   Diagnosis Date Noted  . Dizziness and giddiness 08/09/2014  .  Morbid obesity 07/25/2014  . Orthostatic hypotension 07/07/2014  . Upper airway cough syndrome 06/26/2014  . Diarrhea 05/02/2014  . Dizziness 04/12/2014  . Neuropathy   . Rash and nonspecific skin eruption 08/30/2013  . Paresthesia of both feet 08/30/2013  . Blurred vision, bilateral 02/15/2013  . Eye pain 02/15/2013  . Sinusitis, chronic 10/07/2012  . Left shoulder pain 04/15/2012  . Preventative health care 10/22/2011  . Cervical disc disease 10/22/2011  . Bilateral hand pain 08/05/2011  . Reflux 06/29/2011  . Bloating 06/29/2011  . Back pain 03/13/2011  . Vertigo 01/04/2011  . Nystagmus 01/04/2011  . Insomnia 10/21/2010  . CONSTIPATION 08/17/2009  . VITAMIN D DEFICIENCY 05/15/2009  . DYSPNEA 03/13/2009  . Essential hypertension, benign 01/30/2009  . DEPRESSION 01/16/2009  . Irritable bowel syndrome 01/16/2009  . OSTEOARTHRITIS 12/15/2008  . Diabetes 11/03/2008  . Hyperlipidemia 11/03/2008  . Anxiety state 11/03/2008  . OVERACTIVE BLADDER 11/03/2008  . OSA (obstructive sleep apnea) 11/03/2008  . MURMUR 11/03/2008    Beatriz Settles C 09/19/2014, 2:51 PM 2:52 PM  Etta Grandchild, PT, DPT [place] License # [id number]      [facility] [address] [place], [place], [address] Phone: [phone]   Fax:  [phone]

## 2014-09-20 ENCOUNTER — Encounter (INDEPENDENT_AMBULATORY_CARE_PROVIDER_SITE_OTHER): Payer: Self-pay | Admitting: Diagnostic Neuroimaging

## 2014-09-20 ENCOUNTER — Ambulatory Visit (INDEPENDENT_AMBULATORY_CARE_PROVIDER_SITE_OTHER): Payer: Medicare Other | Admitting: Diagnostic Neuroimaging

## 2014-09-20 DIAGNOSIS — R208 Other disturbances of skin sensation: Secondary | ICD-10-CM

## 2014-09-20 DIAGNOSIS — Z0289 Encounter for other administrative examinations: Secondary | ICD-10-CM

## 2014-09-20 DIAGNOSIS — R200 Anesthesia of skin: Secondary | ICD-10-CM

## 2014-09-20 NOTE — Procedures (Signed)
   [facility]  NCS (NERVE CONDUCTION STUDY) WITH EMG (ELECTROMYOGRAPHY) REPORT   STUDY DATE: 09/20/14 PATIENT NAME: Laura [name] DOB: 28-Feb-1945 MRN: [id number]  ORDERING CLINICIAN: Antionette Fairy  TECHNOLOGIST: Laretta Alstrom  ELECTROMYOGRAPHER: Earlean Polka. Penumalli, MD  CLINICAL INFORMATION: 69 year old female with bilateral upper extremity numbness and pain.  FINDINGS: NERVE CONDUCTION STUDY: Bilateral median and ulnar motor responses and F wave latencies are normal. Bilateral median and ulnar sensory responses are normal.  NEEDLE ELECTROMYOGRAPHY: Needle examination of left upper extremity deltoid, biceps, triceps, flexor carpi radialis, first dorsal interosseous, and left C7-T1 paraspinal muscles is normal.   IMPRESSION:  This is a normal study. No electrodiagnostic evidence of large fiber neuropathy or cervical radiculopathy at this time.    INTERPRETING PHYSICIAN:  Penni Bombard, MD Certified in Neurology, Neurophysiology and Neuroimaging  [facility] [address] [place], [place] [address] [phone]

## 2014-09-21 ENCOUNTER — Ambulatory Visit (HOSPITAL_COMMUNITY): Payer: Medicare Other | Admitting: Physical Therapy

## 2014-09-21 DIAGNOSIS — R2681 Unsteadiness on feet: Secondary | ICD-10-CM

## 2014-09-21 DIAGNOSIS — Z9181 History of falling: Secondary | ICD-10-CM

## 2014-09-21 DIAGNOSIS — M47812 Spondylosis without myelopathy or radiculopathy, cervical region: Secondary | ICD-10-CM

## 2014-09-21 DIAGNOSIS — H8110 Benign paroxysmal vertigo, unspecified ear: Secondary | ICD-10-CM

## 2014-09-21 DIAGNOSIS — M25512 Pain in left shoulder: Secondary | ICD-10-CM

## 2014-09-21 DIAGNOSIS — R269 Unspecified abnormalities of gait and mobility: Secondary | ICD-10-CM

## 2014-09-21 DIAGNOSIS — Z7409 Other reduced mobility: Secondary | ICD-10-CM

## 2014-09-21 NOTE — Therapy (Signed)
[place], [place], [address] Phone: [phone]   Fax:  [phone]  Patient Details  Name: Laura [name] MRN: [id number] Date of Birth: 1946-01-02 Referring Provider:  Collene Gobble, MD  Encounter Date: 09/21/2014  Patient came into session with noted diaphoresis, pale and general malaise.  Pt reported she has not felt well today.  States she is tired and her vision is blurry when trying to read or focus.  BP taken in seated position 140/80 mmHg.  Pt is also diabetic and reports she ate a couple peanut butter crackers prior to session but did not check her blood glucose.  Pt expressed she did not feel up to completing activities today and just wanted to rest.   Upon discussion, learned patient has not had an eye exam in over a year and may be contributing to vision obstruction.   Pt instructed to rest remainder of day, check her sugar and make an eye exam appointment.  Pt also instructed to contact MD or go to ED if continues to feel bad or condition worsens.  Pt verbalized understanding.    Teena Irani, PTA/CLT [phone] 09/21/2014, 2:12 PM  [facility] [address] [place], [place], [address] Phone: [phone]   Fax:  [phone]

## 2014-09-25 ENCOUNTER — Encounter (HOSPITAL_COMMUNITY): Payer: Self-pay

## 2014-09-25 DIAGNOSIS — M19012 Primary osteoarthritis, left shoulder: Secondary | ICD-10-CM | POA: Diagnosis not present

## 2014-09-25 DIAGNOSIS — M25512 Pain in left shoulder: Secondary | ICD-10-CM | POA: Diagnosis not present

## 2014-09-25 DIAGNOSIS — M7542 Impingement syndrome of left shoulder: Secondary | ICD-10-CM | POA: Diagnosis not present

## 2014-09-25 DIAGNOSIS — M4722 Other spondylosis with radiculopathy, cervical region: Secondary | ICD-10-CM | POA: Diagnosis not present

## 2014-09-26 ENCOUNTER — Telehealth (HOSPITAL_COMMUNITY): Payer: Self-pay | Admitting: Physical Therapy

## 2014-09-26 ENCOUNTER — Ambulatory Visit (HOSPITAL_COMMUNITY): Payer: Medicare Other | Admitting: Physical Therapy

## 2014-09-26 DIAGNOSIS — M25512 Pain in left shoulder: Secondary | ICD-10-CM | POA: Diagnosis not present

## 2014-09-26 DIAGNOSIS — Z9181 History of falling: Secondary | ICD-10-CM | POA: Diagnosis not present

## 2014-09-26 DIAGNOSIS — M25612 Stiffness of left shoulder, not elsewhere classified: Secondary | ICD-10-CM | POA: Diagnosis not present

## 2014-09-26 DIAGNOSIS — R2681 Unsteadiness on feet: Secondary | ICD-10-CM | POA: Diagnosis not present

## 2014-09-26 DIAGNOSIS — H8110 Benign paroxysmal vertigo, unspecified ear: Secondary | ICD-10-CM

## 2014-09-26 DIAGNOSIS — M436 Torticollis: Secondary | ICD-10-CM

## 2014-09-26 DIAGNOSIS — R269 Unspecified abnormalities of gait and mobility: Secondary | ICD-10-CM

## 2014-09-26 DIAGNOSIS — M47812 Spondylosis without myelopathy or radiculopathy, cervical region: Secondary | ICD-10-CM | POA: Diagnosis not present

## 2014-09-26 DIAGNOSIS — Z7409 Other reduced mobility: Secondary | ICD-10-CM | POA: Diagnosis not present

## 2014-09-26 NOTE — Telephone Encounter (Signed)
Spoke to patient regarding her PT treatments. She reports that she had been receiving PT for her neck and shoulder at [facility], but was told that she cannot get PT in two places and that they cannot do vestibular PT at [facility] clinic. Advised patient that best course of care may be to transfer all of her PT to [facility] as we can do neck, shoulder, and vertigo/vestibular here.   Note that patient was orinigially scheduled for OT eval for neck/shoulder tomorrow, but had front desk staff call and cancel and tell patient that we will just combine neck/shoulder/vertigo treatments into PT and [facility] can send their notes to [place] here so we can pick up where they left off. Patient will be seen next on Thursday.   Deniece Ree PT, DPT [phone]

## 2014-09-26 NOTE — Therapy (Signed)
[place], [place], [address] Phone: [phone]   Fax:  [phone]  Physical Therapy Treatment  Patient Details  Name: Laura [name] MRN: [id number] Date of Birth: 24-Jun-1945 Referring Provider:  Collene Gobble, MD  Encounter Date: 09/26/2014      PT End of Session - 09/26/14 1520    Visit Number 5   Number of Visits 10   Date for PT Re-Evaluation 10/02/14   Authorization Type Medicare/Medicaid    Authorization Time Period 09/04/14 to 11/04/14   Authorization - Visit Number 5   Authorization - Number of Visits 10   PT Start Time 5809   PT Stop Time 1514   PT Time Calculation (min) 39 min   Equipment Utilized During Treatment Gait belt   Activity Tolerance Patient tolerated treatment well      Past Medical History  Diagnosis Date  . Diabetes mellitus   . Hyperlipidemia   . Hypertension   . Anxiety   . Reflux   . Vertigo   . Arthritis     back of neck, bones spurs on neck  . Sleep apnea     wears CPAP occasionally  . Cervical disc disease   . Neuropathy     Past Surgical History  Procedure Laterality Date  . Bladder surgery    . Cholecystectomy    . Partial hysterectomy    . Fibroids removed      breast (both breasts)  . Breast surgery      There were no vitals filed for this visit.  Visit Diagnosis:  Benign paroxysmal positional vertigo, unspecified laterality  Unsteadiness  Abnormality of gait  Risk for falls  Neck stiffness      Subjective Assessment - 09/26/14 1516    Subjective Pt states she continues to feel off balance.  Denies any pain.                 Balance Exercises - 09/26/14 1517    Balance Exercises: Standing   SLS Eyes open;Foam/compliant surface;3 reps   Wall Bumps Shoulder;Hip   Wall Bumps-Shoulders Eyes opened;10 reps   Wall Bumps-Hips 10 reps   Balance Beam tandem/ retro and stepping over hurdles all x 10 reps    Tandem Gait Forward;Retro;2  reps   Marching Limitations x10 on foam    Sit to Stand Time x10 off of wobble disc    Balance Exercises: Seated   Other Seated Exercises cerivcal and thoracic excursions x 3    Balance Exercises: Supine   Canalith Repositioning - Right x1   Other Supine Exercises hall pike dix with (+) head turned right.              PT Short Term Goals - 09/04/14 1451    PT SHORT TERM GOAL #1   Title Patient will state that she consistently has no more than 2/10 dizziness and no more than 4/10 unsteadiness    Time 2   Period Weeks   Status New   PT SHORT TERM GOAL #2   Title Patient will demonstrate an improvement of at least 10 degrees on all cervical planes with pain 0/10   Time 2   Period Weeks   Status New   PT SHORT TERM GOAL #3   Title Patient will correctly and consistently perform appropriate HEP, to be updated PRN    Time 2   Period Weeks   Status New  PT Long Term Goals - 09/04/14 1456    PT LONG TERM GOAL #1   Title Patient will experience no more than 1/10 dizziness or unsteadiness during all functional tasks and activities    Time 5   Period Weeks   Status New   PT LONG TERM GOAL #2   Title Patient will be able to complete TUG in 8 seconds on a consistent basis    Time 5   Period Weeks   Status New   PT LONG TERM GOAL #3   Title Patient will score at least 54 on BERG balance test   Time 5   Period Weeks   Status New   PT LONG TERM GOAL #4   Title Patient to report that she has been able to return to regular light-moderate activities at the gym at least 3 days each week with no exacerbation of symptoms    Time 5   Period Weeks   Status New               Plan - 09/26/14 1521    Clinical Impression Statement Pt had positive hal-pike dix manuever therefore Eplys was initiated followed by excursions to promote cervical motion.  Pt ambulates keeping body very stiff worked on [employer] trader while engaging head motion.    PT Next Visit Plan  continue with high balance activities with head turns.         Problem List Patient Active Problem List   Diagnosis Date Noted  . Dizziness and giddiness 08/09/2014  . Morbid obesity 07/25/2014  . Orthostatic hypotension 07/07/2014  . Upper airway cough syndrome 06/26/2014  . Diarrhea 05/02/2014  . Dizziness 04/12/2014  . Neuropathy   . Rash and nonspecific skin eruption 08/30/2013  . Paresthesia of both feet 08/30/2013  . Blurred vision, bilateral 02/15/2013  . Eye pain 02/15/2013  . Sinusitis, chronic 10/07/2012  . Left shoulder pain 04/15/2012  . Preventative health care 10/22/2011  . Cervical disc disease 10/22/2011  . Bilateral hand pain 08/05/2011  . Reflux 06/29/2011  . Bloating 06/29/2011  . Back pain 03/13/2011  . Vertigo 01/04/2011  . Nystagmus 01/04/2011  . Insomnia 10/21/2010  . CONSTIPATION 08/17/2009  . VITAMIN D DEFICIENCY 05/15/2009  . DYSPNEA 03/13/2009  . Essential hypertension, benign 01/30/2009  . DEPRESSION 01/16/2009  . Irritable bowel syndrome 01/16/2009  . OSTEOARTHRITIS 12/15/2008  . Diabetes 11/03/2008  . Hyperlipidemia 11/03/2008  . Anxiety state 11/03/2008  . OVERACTIVE BLADDER 11/03/2008  . OSA (obstructive sleep apnea) 11/03/2008  . MURMUR 11/03/2008   Rayetta Humphrey, PT CLT [phone] 09/26/2014, 3:25 PM  [facility] [address] [place], [place], [address] Phone: [phone]   Fax:  [phone]

## 2014-09-27 ENCOUNTER — Ambulatory Visit (HOSPITAL_COMMUNITY): Payer: Medicare Other | Admitting: Occupational Therapy

## 2014-09-28 ENCOUNTER — Telehealth (HOSPITAL_COMMUNITY): Payer: Self-pay | Admitting: Physical Therapy

## 2014-09-28 ENCOUNTER — Ambulatory Visit (HOSPITAL_COMMUNITY): Payer: Medicare Other | Admitting: Physical Therapy

## 2014-09-28 DIAGNOSIS — H8110 Benign paroxysmal vertigo, unspecified ear: Secondary | ICD-10-CM | POA: Diagnosis not present

## 2014-09-28 DIAGNOSIS — R2681 Unsteadiness on feet: Secondary | ICD-10-CM

## 2014-09-28 DIAGNOSIS — R269 Unspecified abnormalities of gait and mobility: Secondary | ICD-10-CM | POA: Diagnosis not present

## 2014-09-28 DIAGNOSIS — Z9181 History of falling: Secondary | ICD-10-CM | POA: Diagnosis not present

## 2014-09-28 DIAGNOSIS — M436 Torticollis: Secondary | ICD-10-CM | POA: Diagnosis not present

## 2014-09-28 DIAGNOSIS — M25612 Stiffness of left shoulder, not elsewhere classified: Secondary | ICD-10-CM | POA: Diagnosis not present

## 2014-09-28 DIAGNOSIS — M25512 Pain in left shoulder: Secondary | ICD-10-CM | POA: Diagnosis not present

## 2014-09-28 DIAGNOSIS — Z7409 Other reduced mobility: Secondary | ICD-10-CM | POA: Diagnosis not present

## 2014-09-28 DIAGNOSIS — M47812 Spondylosis without myelopathy or radiculopathy, cervical region: Secondary | ICD-10-CM | POA: Diagnosis not present

## 2014-09-28 NOTE — Therapy (Signed)
[facility] [address] [place], [place], [address] Phone: [phone]   Fax:  [phone]  Physical Therapy Treatment  Patient Details  Name: Laura [name] MRN: [id number] Date of Birth: 11-25-1945 Referring Provider:  Collene Gobble, MD  Encounter Date: 09/28/2014      PT End of Session - 09/28/14 1640    Visit Number 6   Number of Visits 10   Date for PT Re-Evaluation 10/02/14   Authorization Type Medicare/Medicaid    Authorization Time Period 09/04/14 to 11/04/14   Authorization - Visit Number 6   Authorization - Number of Visits 10   PT Start Time 5277   PT Stop Time 1514   PT Time Calculation (min) 29 min   Equipment Utilized During Treatment Gait belt   Activity Tolerance Patient tolerated treatment well   Behavior During Therapy [facility] for tasks assessed/performed      Past Medical History  Diagnosis Date  . Diabetes mellitus   . Hyperlipidemia   . Hypertension   . Anxiety   . Reflux   . Vertigo   . Arthritis     back of neck, bones spurs on neck  . Sleep apnea     wears CPAP occasionally  . Cervical disc disease   . Neuropathy     Past Surgical History  Procedure Laterality Date  . Bladder surgery    . Cholecystectomy    . Partial hysterectomy    . Fibroids removed      breast (both breasts)  . Breast surgery      There were no vitals filed for this visit.  Visit Diagnosis:  Benign paroxysmal positional vertigo, unspecified laterality  Unsteadiness  Abnormality of gait  Risk for falls      Subjective Assessment - 09/28/14 1445    Subjective Patient reports that she would like to hold off on her neck/shoulder care transfer from [facility], as she called her insurance company and they said that she could get care at both places. She already called [facility] and says that the staff there told her they would need to make some calls and will get back to her. However pattient does not want to be turned loose  from vertigo care at [facility] because she still feels unsteady and like she needs to improve more.  Reports she continues to get dizzy from looking back and forth at something in church, just doesn't feel like she is where she needs to be in terms of vertigo.    Pertinent History Patient has had the vertigo before, it started in 2012 and was debilitating; had to get PT to rehabiltate from it. For this most recent course, she was hit by a new spell of dizziness getting up from the couch. Currently taking medication to assist in managing dizziness.    Patient Stated Goals get back to feeling like "...a normal person...", wants to get back to walking and exercise, wants to get rid of head swimming feeling, get balance back    Currently in Pain? Yes   Pain Score 5    Pain Location Other (Comment)  shoulder, left and neck                           Vestibular Treatment/Exercise - 09/28/14 0001    Vestibular Treatment/Exercise   Gaze Exercises Comment   Eye/Head Exercise Vertical   Comment saccades on vertical, horizontal, diagonal planes; accomodation  exercise             Balance Exercises - 09/28/14 1501    Balance Exercises: Standing   Standing Eyes Opened Narrow base of support (BOS);Foam/compliant surface;3 reps;20 secs   Tandem Stance Eyes open;Foam/compliant surface;3 reps   Gait with Head Turns Forward;Other (comment)  gait with head turns up and down, left and right            PT Education - 09/28/14 1640    Education provided No   Person(s) Educated Patient   Methods Explanation   Comprehension Verbalized understanding          PT Short Term Goals - 09/04/14 1451    PT SHORT TERM GOAL #1   Title Patient will state that she consistently has no more than 2/10 dizziness and no more than 4/10 unsteadiness    Time 2   Period Weeks   Status New   PT SHORT TERM GOAL #2   Title Patient will demonstrate an improvement of at least 10 degrees on all  cervical planes with pain 0/10   Time 2   Period Weeks   Status New   PT SHORT TERM GOAL #3   Title Patient will correctly and consistently perform appropriate HEP, to be updated PRN    Time 2   Period Weeks   Status New           PT Long Term Goals - 09/04/14 1456    PT LONG TERM GOAL #1   Title Patient will experience no more than 1/10 dizziness or unsteadiness during all functional tasks and activities    Time 5   Period Weeks   Status New   PT LONG TERM GOAL #2   Title Patient will be able to complete TUG in 8 seconds on a consistent basis    Time 5   Period Weeks   Status New   PT LONG TERM GOAL #3   Title Patient will score at least 54 on BERG balance test   Time 5   Period Weeks   Status New   PT LONG TERM GOAL #4   Title Patient to report that she has been able to return to regular light-moderate activities at the gym at least 3 days each week with no exacerbation of symptoms    Time 5   Period Weeks   Status New               Plan - 09/28/14 1641    Clinical Impression Statement Patient arrived today reporting she would like to hold off on starting neck/shoulder therapy here as she called her insurance company and they said she could get therapy in two places if it was for two different things; she talked to [place] and they are going to check to see if this is OK per [employer], but for now she would like to keep diong vertigo treatments here and neck/shoulder at [facility]. Performed saccades today and also performed gait with head turns, which patient had significant difficulty iwth and did require MIn assist to prevent fall. After gait-head turn activity was performed, patient continued to experience increased symptoms through remainder of session.    Pt will benefit from skilled therapeutic intervention in order to improve on the following deficits Pain;Decreased range of motion;Decreased strength   Rehab Potential Good   PT Frequency 2x / week   PT  Duration Other (comment)   PT Treatment/Interventions ADLs/Self Care Home Management;Gait training;Functional mobility training;Therapeutic  activities;Balance training;Therapeutic exercise;Neuromuscular re-education;Patient/family education;Energy conservation   PT Next Visit Plan continue with high balance activities, gentle approach towards head turning activities due to todays' responses    Consulted and Agree with Plan of Care Patient        Problem List Patient Active Problem List   Diagnosis Date Noted  . Dizziness and giddiness 08/09/2014  . Morbid obesity 07/25/2014  . Orthostatic hypotension 07/07/2014  . Upper airway cough syndrome 06/26/2014  . Diarrhea 05/02/2014  . Dizziness 04/12/2014  . Neuropathy   . Rash and nonspecific skin eruption 08/30/2013  . Paresthesia of both feet 08/30/2013  . Blurred vision, bilateral 02/15/2013  . Eye pain 02/15/2013  . Sinusitis, chronic 10/07/2012  . Left shoulder pain 04/15/2012  . Preventative health care 10/22/2011  . Cervical disc disease 10/22/2011  . Bilateral hand pain 08/05/2011  . Reflux 06/29/2011  . Bloating 06/29/2011  . Back pain 03/13/2011  . Vertigo 01/04/2011  . Nystagmus 01/04/2011  . Insomnia 10/21/2010  . CONSTIPATION 08/17/2009  . VITAMIN D DEFICIENCY 05/15/2009  . DYSPNEA 03/13/2009  . Essential hypertension, benign 01/30/2009  . DEPRESSION 01/16/2009  . Irritable bowel syndrome 01/16/2009  . OSTEOARTHRITIS 12/15/2008  . Diabetes 11/03/2008  . Hyperlipidemia 11/03/2008  . Anxiety state 11/03/2008  . OVERACTIVE BLADDER 11/03/2008  . OSA (obstructive sleep apnea) 11/03/2008  . MURMUR 11/03/2008    Deniece Ree PT, DPT [phone]  [facility] [address] [place], [place], [address] Phone: [phone]   Fax:  [phone]

## 2014-09-28 NOTE — Telephone Encounter (Signed)
NECK EVAL TBD per Cyril Mourning she will followup with the [facility]). NF 09/28/14

## 2014-10-02 ENCOUNTER — Ambulatory Visit (HOSPITAL_COMMUNITY): Payer: Medicare Other | Admitting: Physical Therapy

## 2014-10-02 DIAGNOSIS — M47812 Spondylosis without myelopathy or radiculopathy, cervical region: Secondary | ICD-10-CM | POA: Diagnosis not present

## 2014-10-02 DIAGNOSIS — R269 Unspecified abnormalities of gait and mobility: Secondary | ICD-10-CM

## 2014-10-02 DIAGNOSIS — H8110 Benign paroxysmal vertigo, unspecified ear: Secondary | ICD-10-CM

## 2014-10-02 DIAGNOSIS — M436 Torticollis: Secondary | ICD-10-CM | POA: Diagnosis not present

## 2014-10-02 DIAGNOSIS — M25612 Stiffness of left shoulder, not elsewhere classified: Secondary | ICD-10-CM | POA: Diagnosis not present

## 2014-10-02 DIAGNOSIS — M25512 Pain in left shoulder: Secondary | ICD-10-CM | POA: Diagnosis not present

## 2014-10-02 DIAGNOSIS — R2681 Unsteadiness on feet: Secondary | ICD-10-CM | POA: Diagnosis not present

## 2014-10-02 DIAGNOSIS — Z9181 History of falling: Secondary | ICD-10-CM

## 2014-10-02 DIAGNOSIS — Z7409 Other reduced mobility: Secondary | ICD-10-CM | POA: Diagnosis not present

## 2014-10-02 NOTE — Therapy (Signed)
[facility] [place], [place], [address] Phone: [phone]   Fax:  [phone]  Physical Therapy Treatment  Patient Details  Name: Laura [name] MRN: [id number] Date of Birth: 07-Feb-1945 Referring Provider:  Collene Gobble, MD  Encounter Date: 10/02/2014      PT End of Session - 10/02/14 1621    Visit Number 7   Number of Visits 10   Date for PT Re-Evaluation 10/04/14   Authorization Type Medicare/Medicaid    Authorization Time Period 09/04/14 to 11/04/14   Authorization - Visit Number 7   Authorization - Number of Visits 10   PT Start Time 3790   PT Stop Time 1513   PT Time Calculation (min) 38 min   Equipment Utilized During Treatment Gait belt   Activity Tolerance Patient tolerated treatment well   Behavior During Therapy [facility] for tasks assessed/performed      Past Medical History  Diagnosis Date  . Diabetes mellitus   . Hyperlipidemia   . Hypertension   . Anxiety   . Reflux   . Vertigo   . Arthritis     back of neck, bones spurs on neck  . Sleep apnea     wears CPAP occasionally  . Cervical disc disease   . Neuropathy     Past Surgical History  Procedure Laterality Date  . Bladder surgery    . Cholecystectomy    . Partial hysterectomy    . Fibroids removed      breast (both breasts)  . Breast surgery      There were no vitals filed for this visit.  Visit Diagnosis:  Benign paroxysmal positional vertigo, unspecified laterality  Unsteadiness  Abnormality of gait  Risk for falls  Neck stiffness      Subjective Assessment - 10/02/14 1437    Subjective Patient reports that she is not feeling 100% today, just did not sleep well the past couple of nights and was tossing and turning. Is still waiting to hear from [facility] as to if she can go to multiple clinics, one for neck/shoulder and one for vertigo, would like to push off re-assessment until she calls [facility] today or tomorrow.  Had a bad  dizzy spell Saturday; she was asleep and just woke up dizzy, had anxiety attack.  reports that she is getting more worried about having anxiety attacks regarding dizziness, like she did before; notices that she is getting more irritable. Reports that she gets frignthened when she feels dizziness coming on, is wondering if counseling might help her with this like it did before.    Pertinent History Patient has had the vertigo before, it started in 2012 and was debilitating; had to get PT to rehabiltate from it. For this most recent course, she was hit by a new spell of dizziness getting up from the couch. Currently taking medication to assist in managing dizziness.    Patient Stated Goals get back to feeling like "...a normal person...", wants to get back to walking and exercise, wants to get rid of head swimming feeling, get balance back    Currently in Pain? Yes   Pain Score 4    Pain Location Other (Comment)  neck and shoulder                         [facility] Adult PT Treatment/Exercise - 10/02/14 0001    Neck Exercises: Seated   Other Seated Exercise Cervical 3D  excursions 1x10         Vestibular Treatment/Exercise - 10/02/14 0001    Vestibular Treatment/Exercise   Canalith Repositioning Comment  cervical rotation with contralateral lateral trunk flexion    Gaze Exercises --            Balance Exercises - 10/02/14 1701    Balance Exercises: Standing   Standing Eyes Opened Narrow base of support (BOS);Foam/compliant surface;Other (comment)  head turns    Tandem Stance Eyes open;Other (comment)  foam, head turns    Standing, One Foot on a Step Eyes open;Foam/compliant surface  head turns    Rockerboard Lateral;EO   Gait with Head Turns Forward;3 reps;Other (comment)  3x89ft with horizontal, vertical, and combination of these    Other Standing Exercises standing hip ABD walks 2x31ft to strengthen hip ABD muscles and assist in pelvic stablity for balance             PT Education - 10/02/14 1621    Education provided No          PT Short Term Goals - 09/04/14 1451    PT SHORT TERM GOAL #1   Title Patient will state that she consistently has no more than 2/10 dizziness and no more than 4/10 unsteadiness    Time 2   Period Weeks   Status New   PT SHORT TERM GOAL #2   Title Patient will demonstrate an improvement of at least 10 degrees on all cervical planes with pain 0/10   Time 2   Period Weeks   Status New   PT SHORT TERM GOAL #3   Title Patient will correctly and consistently perform appropriate HEP, to be updated PRN    Time 2   Period Weeks   Status New           PT Long Term Goals - 09/04/14 1456    PT LONG TERM GOAL #1   Title Patient will experience no more than 1/10 dizziness or unsteadiness during all functional tasks and activities    Time 5   Period Weeks   Status New   PT LONG TERM GOAL #2   Title Patient will be able to complete TUG in 8 seconds on a consistent basis    Time 5   Period Weeks   Status New   PT LONG TERM GOAL #3   Title Patient will score at least 54 on BERG balance test   Time 5   Period Weeks   Status New   PT LONG TERM GOAL #4   Title Patient to report that she has been able to return to regular light-moderate activities at the gym at least 3 days each week with no exacerbation of symptoms    Time 5   Period Weeks   Status New               Plan - 10/02/14 1622    Clinical Impression Statement Patient arrived feeling generally fairly anxious regarding her dizziness, reported taht she had anxiety attack the other night when she woke up dizzy. Requests that re-eval be pushed back to wednesday so she can call other clinic back to see if she can come to two separate places per [employer]. Able to perform balance tasks with head rotation; patient able to tolerate this activthy better today but did demonstrate some increased dizziness with this task. Also introduced rocker board  today as well as hip ABD walks in order to stablize hip ABD groups  to assist in improving balance.    Pt will benefit from skilled therapeutic intervention in order to improve on the following deficits Pain;Decreased range of motion;Decreased strength   Rehab Potential Good   PT Frequency 2x / week   PT Duration Other (comment)   PT Treatment/Interventions ADLs/Self Care Home Management;Gait training;Functional mobility training;Therapeutic activities;Balance training;Therapeutic exercise;Neuromuscular re-education;Patient/family education;Energy conservation   PT Next Visit Plan continue with high balance activities, continue head turning activities due to todays' responses    Consulted and Agree with Plan of Care Patient        Problem List Patient Active Problem List   Diagnosis Date Noted  . Dizziness and giddiness 08/09/2014  . Morbid obesity 07/25/2014  . Orthostatic hypotension 07/07/2014  . Upper airway cough syndrome 06/26/2014  . Diarrhea 05/02/2014  . Dizziness 04/12/2014  . Neuropathy   . Rash and nonspecific skin eruption 08/30/2013  . Paresthesia of both feet 08/30/2013  . Blurred vision, bilateral 02/15/2013  . Eye pain 02/15/2013  . Sinusitis, chronic 10/07/2012  . Left shoulder pain 04/15/2012  . Preventative health care 10/22/2011  . Cervical disc disease 10/22/2011  . Bilateral hand pain 08/05/2011  . Reflux 06/29/2011  . Bloating 06/29/2011  . Back pain 03/13/2011  . Vertigo 01/04/2011  . Nystagmus 01/04/2011  . Insomnia 10/21/2010  . CONSTIPATION 08/17/2009  . VITAMIN D DEFICIENCY 05/15/2009  . DYSPNEA 03/13/2009  . Essential hypertension, benign 01/30/2009  . DEPRESSION 01/16/2009  . Irritable bowel syndrome 01/16/2009  . OSTEOARTHRITIS 12/15/2008  . Diabetes 11/03/2008  . Hyperlipidemia 11/03/2008  . Anxiety state 11/03/2008  . OVERACTIVE BLADDER 11/03/2008  . OSA (obstructive sleep apnea) 11/03/2008  . MURMUR 11/03/2008    Deniece Ree PT,  DPT [phone]  [facility] [address] [place], [place], [address] Phone: [phone]   Fax:  [phone]

## 2014-10-03 ENCOUNTER — Other Ambulatory Visit: Payer: Self-pay | Admitting: Internal Medicine

## 2014-10-03 MED ORDER — LORAZEPAM 0.5 MG PO TABS: 0.5000 mg | ORAL_TABLET | Freq: Every day | ORAL | Status: DC | PRN

## 2014-10-03 NOTE — Telephone Encounter (Signed)
Done hardcopy to Dahlia  

## 2014-10-04 ENCOUNTER — Ambulatory Visit (HOSPITAL_COMMUNITY): Payer: Medicare Other | Admitting: Physical Therapy

## 2014-10-04 DIAGNOSIS — R2681 Unsteadiness on feet: Secondary | ICD-10-CM

## 2014-10-04 DIAGNOSIS — R269 Unspecified abnormalities of gait and mobility: Secondary | ICD-10-CM

## 2014-10-04 DIAGNOSIS — H8110 Benign paroxysmal vertigo, unspecified ear: Secondary | ICD-10-CM

## 2014-10-04 DIAGNOSIS — Z9181 History of falling: Secondary | ICD-10-CM | POA: Diagnosis not present

## 2014-10-04 DIAGNOSIS — M25512 Pain in left shoulder: Secondary | ICD-10-CM | POA: Diagnosis not present

## 2014-10-04 DIAGNOSIS — M436 Torticollis: Secondary | ICD-10-CM | POA: Diagnosis not present

## 2014-10-04 DIAGNOSIS — Z7409 Other reduced mobility: Secondary | ICD-10-CM | POA: Diagnosis not present

## 2014-10-04 DIAGNOSIS — M47812 Spondylosis without myelopathy or radiculopathy, cervical region: Secondary | ICD-10-CM | POA: Diagnosis not present

## 2014-10-04 DIAGNOSIS — M25612 Stiffness of left shoulder, not elsewhere classified: Secondary | ICD-10-CM | POA: Diagnosis not present

## 2014-10-04 NOTE — Therapy (Signed)
[place], [place], [address] Phone: [phone]   Fax:  [phone]  Physical Therapy Treatment (Re-Assessment)  Patient Details  Name: Laura [name] MRN: [id number] Date of Birth: [birth year] Referring Provider:  Biagio Borg, MD  Encounter Date: 10/04/2014      PT End of Session - 10/04/14 1652    Visit Number 8   Number of Visits 12   Date for PT Re-Evaluation 11/01/14   Authorization Type Medicare/Medicaid (G-code done 8th session)   Authorization Time Period 09/04/14 to 11/04/14   Authorization - Visit Number 8   Authorization - Number of Visits 18   PT Start Time 1606   PT Stop Time 1646   PT Time Calculation (min) 40 min   Activity Tolerance Patient tolerated treatment well   Behavior During Therapy [facility] for tasks assessed/performed      Past Medical History  Diagnosis Date  . Diabetes mellitus   . Hyperlipidemia   . Hypertension   . Anxiety   . Reflux   . Vertigo   . Arthritis     back of neck, bones spurs on neck  . Sleep apnea     wears CPAP occasionally  . Cervical disc disease   . Neuropathy     Past Surgical History  Procedure Laterality Date  . Bladder surgery    . Cholecystectomy    . Partial hysterectomy    . Fibroids removed      breast (both breasts)  . Breast surgery      There were no vitals filed for this visit.  Visit Diagnosis:  Benign paroxysmal positional vertigo, unspecified laterality  Unsteadiness  Abnormality of gait  Risk for falls  Neck stiffness      Subjective Assessment - 10/04/14 1606    Subjective Patient reports that she has spoken to [place] at the [place] [facility], who had done some leg work and found that the patient is OK to be seen at two different clinics for her two separate problems. Patient reports she is feeling better than she was vertigo-wise when she started, but states that she still just feels weak and has been told that she looks  like she is weak when she is walking. Still having some  anxiety over her dizziness. Reports she has been driving fine, but she has noticed that it is worst when she is walking.  Reports her blood sugar was up to 246 today, and it has never been that hgih; took her medicine and it dropped to 143 . Also reports that she has had two head injuries in the past, also concerned about how generally weak she feels.    Pertinent History Patient has had the vertigo before, it started in 2012 and was debilitating; had to get PT to rehabiltate from it. For this most recent course, she was hit by a new spell of dizziness getting up from the couch. Currently taking medication to assist in managing dizziness.    How long can you stand comfortably? 9/21- no longer feels dizzy when she is standing in place, main limits are when she is walking    How long can you walk comfortably? 9/21- 272ft, estimated    Patient Stated Goals get back to feeling like "...a normal person...", wants to get back to walking and exercise, wants to get rid of head swimming feeling, get balance back    Currently in Pain? Yes   Pain Score 4  Pain Location Other (Comment)  L neck and shoulder    Pain Orientation Left            [facility] PT Assessment - 10/04/14 0001    AROM   Cervical Flexion 65   Cervical Extension 28   Cervical - Right Side Bend 47   Cervical - Left Side Bend 50   Cervical - Right Rotation 69   Cervical - Left Rotation 72   Berg Balance Test   Sit to Stand Able to stand without using hands and stabilize independently   Standing Unsupported Able to stand safely 2 minutes   Sitting with Back Unsupported but Feet Supported on Floor or Stool Able to sit safely and securely 2 minutes   Stand to Sit Sits safely with minimal use of hands   Transfers Able to transfer safely, minor use of hands   Standing Unsupported with Eyes Closed Able to stand 10 seconds safely   Standing Ubsupported with Feet Together Able to  place feet together independently and stand 1 minute safely   From Standing, Reach Forward with Outstretched Arm Can reach confidently >25 cm (10")   From Standing Position, Pick up Object from Floor Able to pick up shoe safely and easily   From Standing Position, Turn to Look Behind Over each Shoulder Looks behind from both sides and weight shifts well   Turn 360 Degrees Able to turn 360 degrees safely in 4 seconds or less   Standing Unsupported, Alternately Place Feet on Step/Stool Able to stand independently and safely and complete 8 steps in 20 seconds   Standing Unsupported, One Foot in Front Able to plae foot ahead of the other independently and hold 30 seconds   Standing on One Leg Able to lift leg independently and hold 5-10 seconds   Total Score 54   Timed Up and Go Test   TUG Comments 11.6, 11.4, 11.3                       Vestibular Treatment/Exercise - 10/04/14 0001    Eye/Head Exercise Vertical   Comment unable to provoke dizziness/vertigo with testing of visual tracking and cervical rotation with lateral trunk flexion; however able to provoke with turning in circle and looking over shoulder                PT Education - 10/04/14 1652    Education provided Yes   Education Details progress with skilled PT services, plan of care moving forward; advised patient to try going back to gym to see how she feels    Person(s) Educated Patient   Methods Explanation   Comprehension Verbalized understanding          PT Short Term Goals - 10/04/14 1633    PT SHORT TERM GOAL #1   Title Patient will state that she consistently has no more than 2/10 dizziness and no more than 4/10 unsteadiness    Baseline 9/21- 99.9% of the time she is not dizzy and very; around 3-4/10 unsteadiness. Dizziness comes in spurts but when it does come it is very mild and short.    Time 2   Period Weeks   Status Achieved   PT SHORT TERM GOAL #2   Title Patient will demonstrate an  improvement of at least 10 degrees on all cervical planes with pain 0/10   Time 2   Period Weeks   Status On-going   PT SHORT TERM GOAL #3  Title Patient will correctly and consistently perform appropriate HEP, to be updated PRN    Baseline 10-Oct-2022- patient reports that she is typically adherent but has not been the past couple of days due to being busy with lots of MD appointments    Time 2   Period Weeks   Status Achieved           PT Long Term Goals - 2014/10/10 1638    PT LONG TERM GOAL #1   Title Patient will experience no more than 1/10 dizziness or unsteadiness during all functional tasks and activities    Time 5   Period Weeks   Status On-going   PT LONG TERM GOAL #2   Title Patient will be able to complete TUG in 8 seconds on a consistent basis    Baseline 10-10-22- 11.3 at best    Time 5   Period Weeks   Status On-going   PT LONG TERM GOAL #3   Title Patient will score at least 54 on BERG balance test   Time 5   Period Weeks   Status Achieved   PT LONG TERM GOAL #4   Title Patient to report that she has been able to return to regular light-moderate activities at the gym at least 3 days each week with no exacerbation of symptoms    Baseline 10-10-2022- has not tried going back to gym    Time 5   Period Weeks   Status On-going               Plan - Oct 10, 2014 1654    Clinical Impression Statement Re-assessment performed today. Patient has made minimal improvement in cervical ROM however does show improvement in overall balance with increase in score on BERG; she has also been able to ambulate without using assistive device on a consistent basis. However she does report that  she just feels weak and that she feels like this is part of her problems with unsteadiness. Unable to provoke dizziness today except with turning in circles and looking over each shoulder, however have been able to provoke it somewhat in previous sessions. Overall patient has made good progress with  skilled PT services and will benefti from an extension of services, at 1x/week for 4 more weeks; also requested that patient obtain MD referral for general muscle weakness and educated patient that she can likely be treated for weakness in [place] clinic.    Pt will benefit from skilled therapeutic intervention in order to improve on the following deficits Pain;Decreased range of motion;Decreased strength;Dizziness   Rehab Potential Good   PT Frequency 1x / week   PT Duration 4 weeks   PT Treatment/Interventions ADLs/Self Care Home Management;Gait training;Functional mobility training;Therapeutic activities;Balance training;Therapeutic exercise;Neuromuscular re-education;Patient/family education;Energy conservation   PT Next Visit Plan continue with high level balance activities, continue head turning activities due to todays' responses    Consulted and Agree with Plan of Care Patient          G-Codes - 10/10/14 1704    Functional Assessment Tool Used Based on skilled clinical assessment of vertigo, balance, gait, neck stiffness    Functional Limitation Mobility: Walking and moving around   Mobility: Walking and Moving Around Current Status (G8676) At least 20 percent but less than 40 percent impaired, limited or restricted   Mobility: Walking and Moving Around Goal Status (P9509) At least 1 percent but less than 20 percent impaired, limited or restricted      Problem List Patient Active  Problem List   Diagnosis Date Noted  . Dizziness and giddiness 08/09/2014  . Morbid obesity 07/25/2014  . Orthostatic hypotension 07/07/2014  . Upper airway cough syndrome 06/26/2014  . Diarrhea 05/02/2014  . Dizziness 04/12/2014  . Neuropathy   . Rash and nonspecific skin eruption 08/30/2013  . Paresthesia of both feet 08/30/2013  . Blurred vision, bilateral 02/15/2013  . Eye pain 02/15/2013  . Sinusitis, chronic 10/07/2012  . Left shoulder pain 04/15/2012  . Preventative health care 10/22/2011   . Cervical disc disease 10/22/2011  . Bilateral hand pain 08/05/2011  . Reflux 06/29/2011  . Bloating 06/29/2011  . Back pain 03/13/2011  . Vertigo 01/04/2011  . Nystagmus 01/04/2011  . Insomnia 10/21/2010  . CONSTIPATION 08/17/2009  . VITAMIN D DEFICIENCY 05/15/2009  . DYSPNEA 03/13/2009  . Essential hypertension, benign 01/30/2009  . DEPRESSION 01/16/2009  . Irritable bowel syndrome 01/16/2009  . OSTEOARTHRITIS 12/15/2008  . Diabetes 11/03/2008  . Hyperlipidemia 11/03/2008  . Anxiety state 11/03/2008  . OVERACTIVE BLADDER 11/03/2008  . OSA (obstructive sleep apnea) 11/03/2008  . MURMUR 11/03/2008    Physical Therapy Progress Note  Dates of Reporting Period: 09/04/14 to 10/04/14  Objective Reports of Subjective Statement: see above   Objective Measurements: see above   Goal Update: see above   Plan: see above   Reason Skilled Services are Required: vertigo, unsteadiness, cervical stiffness, development of advanced HEP before [place] [facility], DPT [place], [place], [address] Phone: [phone]   Fax:  [phone]

## 2014-10-04 NOTE — Telephone Encounter (Signed)
Rx faxed to pharmacy  

## 2014-10-05 ENCOUNTER — Encounter: Payer: Self-pay | Admitting: Internal Medicine

## 2014-10-05 DIAGNOSIS — H2513 Age-related nuclear cataract, bilateral: Secondary | ICD-10-CM | POA: Diagnosis not present

## 2014-10-05 DIAGNOSIS — H25013 Cortical age-related cataract, bilateral: Secondary | ICD-10-CM | POA: Diagnosis not present

## 2014-10-05 DIAGNOSIS — E119 Type 2 diabetes mellitus without complications: Secondary | ICD-10-CM | POA: Diagnosis not present

## 2014-10-05 LAB — HM DIABETES EYE EXAM

## 2014-10-06 ENCOUNTER — Encounter: Payer: Self-pay | Admitting: Internal Medicine

## 2014-10-11 ENCOUNTER — Ambulatory Visit: Payer: Medicare Other | Attending: Specialist | Admitting: Physical Therapy

## 2014-10-11 DIAGNOSIS — M542 Cervicalgia: Secondary | ICD-10-CM | POA: Diagnosis not present

## 2014-10-11 DIAGNOSIS — G4733 Obstructive sleep apnea (adult) (pediatric): Secondary | ICD-10-CM | POA: Diagnosis not present

## 2014-10-11 DIAGNOSIS — M25512 Pain in left shoulder: Secondary | ICD-10-CM | POA: Diagnosis not present

## 2014-10-11 DIAGNOSIS — M25612 Stiffness of left shoulder, not elsewhere classified: Secondary | ICD-10-CM | POA: Diagnosis not present

## 2014-10-11 NOTE — Therapy (Signed)
[facility] [place], [place], [address] Phone: [phone]   Fax:  [phone]  Physical Therapy Evaluation  Patient Details  Name: Laura [name] MRN: [id number] Date of Birth: December 21, 1945 Referring Provider:  Jessy Oto, MD  Encounter Date: 10/11/2014      PT End of Session - 10/11/14 1238    Visit Number 1   Number of Visits 12   Date for PT Re-Evaluation 11/29/14   PT Start Time 8182   PT Stop Time 1122   PT Time Calculation (min) 47 min   Activity Tolerance Patient tolerated treatment well   Behavior During Therapy [facility] for tasks assessed/performed      Past Medical History  Diagnosis Date  . Diabetes mellitus   . Hyperlipidemia   . Hypertension   . Anxiety   . Reflux   . Vertigo   . Arthritis     back of neck, bones spurs on neck  . Sleep apnea     wears CPAP occasionally  . Cervical disc disease   . Neuropathy     Past Surgical History  Procedure Laterality Date  . Bladder surgery    . Cholecystectomy    . Partial hysterectomy    . Fibroids removed      breast (both breasts)  . Breast surgery      There were no vitals filed for this visit.  Visit Diagnosis:  Neck pain - Plan: PT plan of care cert/re-cert  Left shoulder pain - Plan: PT plan of care cert/re-cert  Shoulder stiffness, left - Plan: PT plan of care cert/re-cert      Subjective Assessment - 10/11/14 1221    Subjective The patient returns to outpatient physical therapy for neck and shoulder pain with her last visit on 08/07/14.  She states she had began to experience vertigo and is now receiving therapy for that at our Neuro practice.  She rates her neck and left shoulder pain at 3-4/10 but up to 5-6+/10 with neck and shoulder movements.  She was pleased with how she was responding to treatments previosuly and is motivated to improve.   Limitations --  Turning head and lifting left UE.   Pain Score 4    Pain Location Shoulder    Pain Orientation Left            [facility] PT Assessment - 10/11/14 0001    Assessment   Medical Diagnosis Cervical spondylosis and left shoulder cuff arthropathy.   Onset Date/Surgical Date --  Ongoing.   Precautions   Precautions None   Restrictions   Weight Bearing Restrictions No   Balance Screen   Has the patient fallen in the past 6 months No   Has the patient had a decrease in activity level because of a fear of falling?  Yes   Is the patient reluctant to leave their home because of a fear of falling?  Yes   Prior Function   Level of Independence Independent   Vocation Retired   Art therapist   Posture/Postural Control Postural limitations   Postural Limitations Rounded Shoulders;Forward head   ROM / Strength   AROM / PROM / Strength AROM;Strength   AROM   Overall AROM Comments Left shoulder flexion= 114 degrees; ER= 44 degrees and behind the back to L3.   Strength   Overall Strength Comments Left shoulder IR/ER= 4-/5.   Palpation   Palpation comment Tender to palpation over C5 spinous process; left UT and posterior  cuff region.   Special Tests    Special Tests --  Decreased left Biceps DTR.                   [place] Adult PT Treatment/Exercise - 11/10/14 0001    Modalities   Modalities Electrical Stimulation   Electrical Stimulation   Electrical Stimulation Location Left sided of C-spine at C5-6 region and ant/post shoulder IFC @ 100% scan x 15 minutes.   Electrical Stimulation Goals Pain                  PT Short Term Goals - 11/10/2014 1244    PT SHORT TERM GOAL #1   Title Ind with HEP.   Time 2   Period Weeks   Status New           PT Long Term Goals - Nov 10, 2014 1244    PT LONG TERM GOAL #1   Title Perform ADL's with pain not > 3/10.   Time 6   Period Weeks   Status New   PT LONG TERM GOAL #2   Title Active left shoulder flexion to 145 degrees so the patient can easily reach overhead   Time 6   Period Weeks    Status New   PT LONG TERM GOAL #3   Title Active ER to 70 degrees+ to allow for easily donning/doffing of apparel   Time 6   Period Weeks   Status New                 G-Codes - 11-10-14 1247    Functional Assessment Tool Used FOTO.   Functional Limitation Mobility: Walking and moving around   Mobility: Walking and Moving Around Current Status [phone]) At least 40 percent but less than 60 percent impaired, limited or restricted   Mobility: Walking and Moving Around Goal Status [phone]) At least 20 percent but less than 40 percent impaired, limited or restricted       Problem List Patient Active Problem List   Diagnosis Date Noted  . Dizziness and giddiness 08/09/2014  . Morbid obesity 07/25/2014  . Orthostatic hypotension 07/07/2014  . Upper airway cough syndrome 06/26/2014  . Diarrhea 05/02/2014  . Dizziness 04/12/2014  . Neuropathy   . Rash and nonspecific skin eruption 08/30/2013  . Paresthesia of both feet 08/30/2013  . Blurred vision, bilateral 02/15/2013  . Eye pain 02/15/2013  . Sinusitis, chronic 10/07/2012  . Left shoulder pain 04/15/2012  . Preventative health care 10/22/2011  . Cervical disc disease 10/22/2011  . Bilateral hand pain 08/05/2011  . Reflux 06/29/2011  . Bloating 06/29/2011  . Back pain 03/13/2011  . Vertigo 01/04/2011  . Nystagmus 01/04/2011  . Insomnia 10/21/2010  . CONSTIPATION 08/17/2009  . VITAMIN D DEFICIENCY 05/15/2009  . DYSPNEA 03/13/2009  . Essential hypertension, benign 01/30/2009  . DEPRESSION 01/16/2009  . Irritable bowel syndrome 01/16/2009  . OSTEOARTHRITIS 12/15/2008  . Diabetes 11/03/2008  . Hyperlipidemia 11/03/2008  . Anxiety state 11/03/2008  . OVERACTIVE BLADDER 11/03/2008  . OSA (obstructive sleep apnea) 11/03/2008  . MURMUR 11/03/2008    Adelise Buswell, [place] MPT 11-10-2014, 12:50 PM  [facility] [address] [place], [place], [address] Phone: [phone]   Fax:   [phone]

## 2014-10-12 ENCOUNTER — Ambulatory Visit (HOSPITAL_COMMUNITY): Payer: Medicare Other | Admitting: Physical Therapy

## 2014-10-12 DIAGNOSIS — R269 Unspecified abnormalities of gait and mobility: Secondary | ICD-10-CM | POA: Diagnosis not present

## 2014-10-12 DIAGNOSIS — M25612 Stiffness of left shoulder, not elsewhere classified: Secondary | ICD-10-CM | POA: Diagnosis not present

## 2014-10-12 DIAGNOSIS — M436 Torticollis: Secondary | ICD-10-CM | POA: Diagnosis not present

## 2014-10-12 DIAGNOSIS — H8110 Benign paroxysmal vertigo, unspecified ear: Secondary | ICD-10-CM

## 2014-10-12 DIAGNOSIS — M47812 Spondylosis without myelopathy or radiculopathy, cervical region: Secondary | ICD-10-CM | POA: Diagnosis not present

## 2014-10-12 DIAGNOSIS — Z9181 History of falling: Secondary | ICD-10-CM

## 2014-10-12 DIAGNOSIS — Z7409 Other reduced mobility: Secondary | ICD-10-CM | POA: Diagnosis not present

## 2014-10-12 DIAGNOSIS — R2681 Unsteadiness on feet: Secondary | ICD-10-CM

## 2014-10-12 DIAGNOSIS — M25512 Pain in left shoulder: Secondary | ICD-10-CM | POA: Diagnosis not present

## 2014-10-12 NOTE — Therapy (Signed)
[place], [place], [address] Phone: [phone]   Fax:  [phone]  Physical Therapy Treatment  Patient Details  Name: Laura [name] MRN: [id number] Date of Birth: 1945/11/28 Referring Provider:  Jessy Oto, MD  Encounter Date: 10/12/2014      PT End of Session - 10/12/14 1527    Visit Number 9   Number of Visits 12   Date for PT Re-Evaluation 11/01/14   Authorization Type Medicare/Medicaid (G-code done 8th session)   Authorization Time Period 09/04/14 to 11/04/14   Authorization - Visit Number 9   Authorization - Number of Visits 18   PT Start Time 1430   PT Stop Time 1512   PT Time Calculation (min) 42 min   Activity Tolerance Patient tolerated treatment well   Behavior During Therapy [facility] for tasks assessed/performed      Past Medical History  Diagnosis Date  . Diabetes mellitus   . Hyperlipidemia   . Hypertension   . Anxiety   . Reflux   . Vertigo   . Arthritis     back of neck, bones spurs on neck  . Sleep apnea     wears CPAP occasionally  . Cervical disc disease   . Neuropathy     Past Surgical History  Procedure Laterality Date  . Bladder surgery    . Cholecystectomy    . Partial hysterectomy    . Fibroids removed      breast (both breasts)  . Breast surgery      There were no vitals filed for this visit.  Visit Diagnosis:  Benign paroxysmal positional vertigo, unspecified laterality  Unsteadiness  Abnormality of gait  Risk for falls      Subjective Assessment - 10/12/14 1525    Subjective PT states she is overall getting better and therapy continues to help her so much.  Pt reports she has a "floating" feeling at times and was at a shower over the weekend and the people moving around her provoked her dizziness.    Currently in Pain? No/denies                              Balance Exercises - 10/12/14 1429    Balance Exercises: Standing   Standing Eyes Opened Narrow base of support (BOS);Foam/compliant surface;Other (comment)   Tandem Stance Eyes open;Other (comment)   Standing, One Foot on a Step Eyes open;Foam/compliant surface   Wall Bumps Shoulder;Hip   Wall Bumps-Shoulders Eyes opened;10 reps   Wall Bumps-Hips 10 reps   Rockerboard Anterior/posterior;Lateral   Balance Beam tandem 2RT   Gait with Head Turns Forward;3 reps;Other (comment)   Marching Limitations on foam with toe taps on 12" step 10 reps   Sit to Stand Time x10 off of wobble disc    Other Standing Exercises standing hip ABD walks 2x30ft to strengthen hip ABD muscles and assist in pelvic stablity for balance    Balance Exercises: Seated   Other Seated Exercises thoracic excursions with UE movements 5 reps each   Other Seated Exercises Comments wall slides 10 reps             PT Short Term Goals - 10/12/14 1536    PT SHORT TERM GOAL #1   Title Patient will state that she consistently has no more than 2/10 dizziness and no more than 4/10 unsteadiness    Baseline 9/21- 99.9% of the  time she is not dizzy and very; around 3-4/10 unsteadiness. Dizziness comes in spurts but when it does come it is very mild and short.    Time 2   Period Weeks   Status Achieved   PT SHORT TERM GOAL #2   Title Patient will demonstrate an improvement of at least 10 degrees on all cervical planes with pain 0/10   Time 2   Period Weeks   Status On-going   PT SHORT TERM GOAL #3   Title Patient will correctly and consistently perform appropriate HEP, to be updated PRN    Baseline 9/21- patient reports that she is typically adherent but has not been the past couple of days due to being busy with lots of MD appointments    Time 2   Period Weeks   Status Achieved           PT Long Term Goals - 10/12/14 1536    PT LONG TERM GOAL #1   Title Patient will experience no more than 1/10 dizziness or unsteadiness during all functional tasks and activities    Time 5   Period  Weeks   Status On-going   PT LONG TERM GOAL #2   Title Patient will be able to complete TUG in 8 seconds on a consistent basis    Baseline 9/21- 11.3 at best    Time 5   Period Weeks   Status On-going   PT LONG TERM GOAL #3   Title Patient will score at least 54 on BERG balance test   Time 5   Period Weeks   Status Achieved   PT LONG TERM GOAL #4   Title Patient to report that she has been able to return to regular light-moderate activities at the gym at least 3 days each week with no exacerbation of symptoms    Baseline 9/21- has not tried going back to gym    Time 5   Period Weeks   Status On-going               Plan - 10/12/14 1527    Clinical Impression Statement Progressed patient today with UE movements with thoracic excursions and resumed balance beam actvitiy.  Pt with most diffiuculty completing static balance activities with up/down head movments.   Pt also has more diffiuclty with Rt LE lead balance than with Lt.  PT continues to receive therapy for her cervical spine at the [facility] clinic.     PT Next Visit Plan continue with high balance activities, continue head turning activities due to todays' responses    Consulted and Agree with Plan of Care Patient       Problem List Patient Active Problem List   Diagnosis Date Noted  . Dizziness and giddiness 08/09/2014  . Morbid obesity 07/25/2014  . Orthostatic hypotension 07/07/2014  . Upper airway cough syndrome 06/26/2014  . Diarrhea 05/02/2014  . Dizziness 04/12/2014  . Neuropathy   . Rash and nonspecific skin eruption 08/30/2013  . Paresthesia of both feet 08/30/2013  . Blurred vision, bilateral 02/15/2013  . Eye pain 02/15/2013  . Sinusitis, chronic 10/07/2012  . Left shoulder pain 04/15/2012  . Preventative health care 10/22/2011  . Cervical disc disease 10/22/2011  . Bilateral hand pain 08/05/2011  . Reflux 06/29/2011  . Bloating 06/29/2011  . Back pain 03/13/2011  . Vertigo 01/04/2011  .  Nystagmus 01/04/2011  . Insomnia 10/21/2010  . CONSTIPATION 08/17/2009  . VITAMIN D DEFICIENCY 05/15/2009  . DYSPNEA 03/13/2009  .  Essential hypertension, benign 01/30/2009  . DEPRESSION 01/16/2009  . Irritable bowel syndrome 01/16/2009  . OSTEOARTHRITIS 12/15/2008  . Diabetes 11/03/2008  . Hyperlipidemia 11/03/2008  . Anxiety state 11/03/2008  . OVERACTIVE BLADDER 11/03/2008  . OSA (obstructive sleep apnea) 11/03/2008  . MURMUR 11/03/2008    Teena Irani, PTA/CLT [phone]  10/12/2014, 3:37 PM  [facility] [address] [place], [place], [address] Phone: [phone]   Fax:  [phone]

## 2014-10-16 ENCOUNTER — Ambulatory Visit (HOSPITAL_COMMUNITY): Payer: Medicare Other | Attending: Psychiatry | Admitting: Physical Therapy

## 2014-10-16 DIAGNOSIS — H8110 Benign paroxysmal vertigo, unspecified ear: Secondary | ICD-10-CM | POA: Insufficient documentation

## 2014-10-16 DIAGNOSIS — Z9181 History of falling: Secondary | ICD-10-CM | POA: Insufficient documentation

## 2014-10-16 DIAGNOSIS — R2681 Unsteadiness on feet: Secondary | ICD-10-CM

## 2014-10-16 DIAGNOSIS — R269 Unspecified abnormalities of gait and mobility: Secondary | ICD-10-CM | POA: Diagnosis not present

## 2014-10-16 DIAGNOSIS — Z7409 Other reduced mobility: Secondary | ICD-10-CM | POA: Insufficient documentation

## 2014-10-16 NOTE — Therapy (Signed)
[place], [place], [address] Phone: [phone]   Fax:  [phone]  Physical Therapy Treatment  Patient Details  Name: Laura [name] MRN: [id number] Date of Birth: [birth year] Referring Provider:  Collene Gobble, MD  Encounter Date: 10/16/2014      PT End of Session - 10/16/14 1700    Visit Number 10   Number of Visits 12   Date for PT Re-Evaluation 11/01/14   Authorization Type Medicare/Medicaid (G-code done 8th session)   Authorization Time Period 09/04/14 to 11/04/14   Authorization - Visit Number 10   Authorization - Number of Visits 18   PT Start Time 1301   PT Stop Time 1341   PT Time Calculation (min) 40 min   Equipment Utilized During Treatment Gait belt   Activity Tolerance Patient tolerated treatment well   Behavior During Therapy [facility] for tasks assessed/performed      Past Medical History  Diagnosis Date  . Diabetes mellitus   . Hyperlipidemia   . Hypertension   . Anxiety   . Reflux   . Vertigo   . Arthritis     back of neck, bones spurs on neck  . Sleep apnea     wears CPAP occasionally  . Cervical disc disease   . Neuropathy     Past Surgical History  Procedure Laterality Date  . Bladder surgery    . Cholecystectomy    . Partial hysterectomy    . Fibroids removed      breast (both breasts)  . Breast surgery      There were no vitals filed for this visit.  Visit Diagnosis:  Benign paroxysmal positional vertigo, unspecified laterality  Unsteadiness  Abnormality of gait  Risk for falls      Subjective Assessment - 10/16/14 1303    Subjective Patient reports that she has had a rough week in terms of dizziness and light-headedness, reports she almost fell at one point and would have had she not had something to hold onto. Reports taht she has been trying to stay hydrated, no big changes in medication   Pertinent History Patient has had the vertigo before, it started in 2012  and was debilitating; had to get PT to rehabiltate from it. For this most recent course, she was hit by a new spell of dizziness getting up from the couch. Currently taking medication to assist in managing dizziness.    Currently in Pain? Yes   Pain Score 3    Pain Location Shoulder   Pain Orientation Left                         [facility] Adult PT Treatment/Exercise - 10/16/14 0001    Neck Exercises: Seated   Other Seated Exercise cervical and thoracic 3D excursions 1x15         Vestibular Treatment/Exercise - 10/16/14 0001    Eye/Head Exercise Vertical   Comment saccades on vertical, horizontal, diagonal planes; accomodation exercise; VOR exercises            Balance Exercises - 10/16/14 1658    Balance Exercises: Standing   Standing Eyes Opened Narrow base of support (BOS);Foam/compliant surface;3 reps;20 secs   Tandem Stance Eyes closed;Foam/compliant surface;3 reps;15 secs;Other (comment)  also with head turns and eyes open    SLS Eyes open;Solid surface;3 reps;15 secs;Limitations  with and without head turns    Other Standing Exercises gait with head turns  on vertical and horizontal planes            PT Education - 10/16/14 1700    Education provided No          PT Short Term Goals - 10/12/14 1536    PT SHORT TERM GOAL #1   Title Patient will state that she consistently has no more than 2/10 dizziness and no more than 4/10 unsteadiness    Baseline 9/21- 99.9% of the time she is not dizzy and very; around 3-4/10 unsteadiness. Dizziness comes in spurts but when it does come it is very mild and short.    Time 2   Period Weeks   Status Achieved   PT SHORT TERM GOAL #2   Title Patient will demonstrate an improvement of at least 10 degrees on all cervical planes with pain 0/10   Time 2   Period Weeks   Status On-going   PT SHORT TERM GOAL #3   Title Patient will correctly and consistently perform appropriate HEP, to be updated PRN    Baseline  9/21- patient reports that she is typically adherent but has not been the past couple of days due to being busy with lots of MD appointments    Time 2   Period Weeks   Status Achieved           PT Long Term Goals - 10/12/14 1536    PT LONG TERM GOAL #1   Title Patient will experience no more than 1/10 dizziness or unsteadiness during all functional tasks and activities    Time 5   Period Weeks   Status On-going   PT LONG TERM GOAL #2   Title Patient will be able to complete TUG in 8 seconds on a consistent basis    Baseline 9/21- 11.3 at best    Time 5   Period Weeks   Status On-going   PT LONG TERM GOAL #3   Title Patient will score at least 54 on BERG balance test   Time 5   Period Weeks   Status Achieved   PT LONG TERM GOAL #4   Title Patient to report that she has been able to return to regular light-moderate activities at the gym at least 3 days each week with no exacerbation of symptoms    Baseline 9/21- has not tried going back to gym    Time 5   Period Weeks   Status On-going               Plan - 10/16/14 1700    Clinical Impression Statement Patient arrived today reporting that she was having increase in dizziness  as well as increased unsteadienss. Performed lateral trunk flexion with contralateral cervical rotation with no exacerbation of dizzineess. Continued with functional  cervical and thoracic excursions as well as gait with head turns, with increased difficulty today, as well as balance exercisess with and without head turns today. Patient overall continues to have difficulty with balance tasks with head turning built in. Reduced compaints of dizziness at end of session.    Pt will benefit from skilled therapeutic intervention in order to improve on the following deficits Pain;Decreased range of motion;Decreased strength;Dizziness   Rehab Potential Good   PT Frequency 1x / week   PT Duration 4 weeks   PT Treatment/Interventions ADLs/Self Care Home  Management;Gait training;Functional mobility training;Therapeutic activities;Balance training;Therapeutic exercise;Neuromuscular re-education;Patient/family education;Energy conservation   PT Next Visit Plan continue with high balance activities, continue head turning activities  due to todays' responses    Consulted and Agree with Plan of Care Patient        Problem List Patient Active Problem List   Diagnosis Date Noted  . Dizziness and giddiness 08/09/2014  . Morbid obesity ([place]) 07/25/2014  . Orthostatic hypotension 07/07/2014  . Upper airway cough syndrome 06/26/2014  . Diarrhea 05/02/2014  . Dizziness 04/12/2014  . Neuropathy ([place])   . Rash and nonspecific skin eruption 08/30/2013  . Paresthesia of both feet 08/30/2013  . Blurred vision, bilateral 02/15/2013  . Eye pain 02/15/2013  . Sinusitis, chronic 10/07/2012  . Left shoulder pain 04/15/2012  . Preventative health care 10/22/2011  . Cervical disc disease 10/22/2011  . Bilateral hand pain 08/05/2011  . Reflux 06/29/2011  . Bloating 06/29/2011  . Back pain 03/13/2011  . Vertigo 01/04/2011  . Nystagmus 01/04/2011  . Insomnia 10/21/2010  . CONSTIPATION 08/17/2009  . VITAMIN D DEFICIENCY 05/15/2009  . DYSPNEA 03/13/2009  . Essential hypertension, benign 01/30/2009  . DEPRESSION 01/16/2009  . Irritable bowel syndrome 01/16/2009  . OSTEOARTHRITIS 12/15/2008  . Diabetes ([place]) 11/03/2008  . Hyperlipidemia 11/03/2008  . Anxiety state 11/03/2008  . OVERACTIVE BLADDER 11/03/2008  . OSA (obstructive sleep apnea) 11/03/2008  . MURMUR 11/03/2008    Deniece Ree PT, DPT [phone]  [place] [address] [place], [place], [address] Phone: [phone]   Fax:  [phone]

## 2014-10-17 ENCOUNTER — Ambulatory Visit: Payer: Medicare Other | Attending: Specialist | Admitting: *Deleted

## 2014-10-17 ENCOUNTER — Encounter: Payer: Self-pay | Admitting: *Deleted

## 2014-10-17 DIAGNOSIS — Z7409 Other reduced mobility: Secondary | ICD-10-CM | POA: Insufficient documentation

## 2014-10-17 DIAGNOSIS — M25512 Pain in left shoulder: Secondary | ICD-10-CM | POA: Insufficient documentation

## 2014-10-17 DIAGNOSIS — M542 Cervicalgia: Secondary | ICD-10-CM | POA: Insufficient documentation

## 2014-10-17 DIAGNOSIS — M436 Torticollis: Secondary | ICD-10-CM | POA: Diagnosis not present

## 2014-10-17 DIAGNOSIS — M25612 Stiffness of left shoulder, not elsewhere classified: Secondary | ICD-10-CM | POA: Diagnosis not present

## 2014-10-17 DIAGNOSIS — M47812 Spondylosis without myelopathy or radiculopathy, cervical region: Secondary | ICD-10-CM

## 2014-10-17 NOTE — Therapy (Signed)
[facility] [place], [place], [address] Phone: [phone]   Fax:  [phone]  Physical Therapy Treatment  Patient Details  Name: Laura [name] MRN: [id number] Date of Birth: Nov 02, 1945 Referring Provider:  Biagio Borg, MD  Encounter Date: 10/17/2014      PT End of Session - 10/16/14 1700    Visit Number 10   Number of Visits 12   Date for PT Re-Evaluation 11/01/14   Authorization Type Medicare/Medicaid (G-code done 8th session)   Authorization Time Period 09/04/14 to 11/04/14   Authorization - Visit Number 10   Authorization - Number of Visits 18   PT Start Time 1301   PT Stop Time 1341   PT Time Calculation (min) 40 min   Equipment Utilized During Treatment Gait belt   Activity Tolerance Patient tolerated treatment well   Behavior During Therapy [facility] for tasks assessed/performed      Past Medical History  Diagnosis Date  . Diabetes mellitus   . Hyperlipidemia   . Hypertension   . Anxiety   . Reflux   . Vertigo   . Arthritis     back of neck, bones spurs on neck  . Sleep apnea     wears CPAP occasionally  . Cervical disc disease   . Neuropathy [facility])     Past Surgical History  Procedure Laterality Date  . Bladder surgery    . Cholecystectomy    . Partial hysterectomy    . Fibroids removed      breast (both breasts)  . Breast surgery      There were no vitals filed for this visit.  Visit Diagnosis:  Neck pain  Left shoulder pain  Shoulder stiffness, left  Pain in joint, shoulder region, left  Cervical spondylosis without myelopathy  Impaired functional mobility and activity tolerance  Neck stiffness      Subjective Assessment - 10/17/14 1304    Subjective Patient reports that she has had a rough week in terms of dizziness and light-headedness, reports she almost fell at one point and would have had she not had something to hold onto. Reports taht she has been trying to stay hydrated,  no big changes in medication   Pertinent History Patient has had the vertigo before, it started in 2012 and was debilitating; had to get PT to rehabiltate from it. For this most recent course, she was hit by a new spell of dizziness getting up from the couch. Currently taking medication to assist in managing dizziness.    How long can you stand comfortably? 9/21- no longer feels dizzy when she is standing in place, main limits are when she is walking    How long can you walk comfortably? 9/21- 238ft, estimated    Patient Stated Goals get back to feeling like "...a normal person...", wants to get back to walking and exercise, wants to get rid of head swimming feeling, get balance back    Currently in Pain? Yes   Pain Score 3    Pain Location Shoulder   Pain Orientation Left   Pain Descriptors / Indicators Aching   Pain Type Chronic pain   Pain Onset More than a month ago   Pain Frequency Constant   Aggravating Factors  Lifting with LT shldr                         [facility] Adult PT Treatment/Exercise - 10/17/14 0001    Modalities  Modalities Traction;Electrical Stimulation;Moist Probation officer Location Premod LT shldr x 15 mins 80-150 hz   Electrical Stimulation Goals Pain   Ultrasound   Ultrasound Location LT UT   Ultrasound Parameters 1.5 w/cm2 x 10 mins   Traction   Type of Traction Cervical   Min (lbs) 5   Max (lbs) 10   Hold Time 99   Rest Time 5   Time 15   Manual Therapy   Manual Therapy Soft tissue mobilization;Passive ROM;Myofascial release   Soft tissue mobilization STW/ TPR to LT UT and levator with pt sitting             Balance Exercises - 10/16/14 1658    Balance Exercises: Standing   Standing Eyes Opened Narrow base of support (BOS);Foam/compliant surface;3 reps;20 secs   Tandem Stance Eyes closed;Foam/compliant surface;3 reps;15 secs;Other (comment)  also with head turns and eyes open    SLS Eyes  open;Solid surface;3 reps;15 secs;Limitations  with and without head turns    Other Standing Exercises gait with head turns on vertical and horizontal planes            PT Education - 10/16/14 1700    Education provided No          PT Short Term Goals - 10/12/14 1536    PT SHORT TERM GOAL #1   Title Patient will state that she consistently has no more than 2/10 dizziness and no more than 4/10 unsteadiness    Baseline 9/21- 99.9% of the time she is not dizzy and very; around 3-4/10 unsteadiness. Dizziness comes in spurts but when it does come it is very mild and short.    Time 2   Period Weeks   Status Achieved   PT SHORT TERM GOAL #2   Title Patient will demonstrate an improvement of at least 10 degrees on all cervical planes with pain 0/10   Time 2   Period Weeks   Status On-going   PT SHORT TERM GOAL #3   Title Patient will correctly and consistently perform appropriate HEP, to be updated PRN    Baseline 9/21- patient reports that she is typically adherent but has not been the past couple of days due to being busy with lots of MD appointments    Time 2   Period Weeks   Status Achieved           PT Long Term Goals - 10/12/14 1536    PT LONG TERM GOAL #1   Title Patient will experience no more than 1/10 dizziness or unsteadiness during all functional tasks and activities    Time 5   Period Weeks   Status On-going   PT LONG TERM GOAL #2   Title Patient will be able to complete TUG in 8 seconds on a consistent basis    Baseline 9/21- 11.3 at best    Time 5   Period Weeks   Status On-going   PT LONG TERM GOAL #3   Title Patient will score at least 54 on BERG balance test   Time 5   Period Weeks   Status Achieved   PT LONG TERM GOAL #4   Title Patient to report that she has been able to return to regular light-moderate activities at the gym at least 3 days each week with no exacerbation of symptoms    Baseline 9/21- has not tried going back to gym    Time 5  Period Weeks   Status On-going          Pt did well with PT Rx today. She was able to tolerate only 10#s today for cervical traction. She is also  sore around where the collar of the Traction touches her neck. KX modifier added      Plan - 10/17/14 1153    PT Treatment/Interventions Traction;Ultrasound;Electrical Stimulation;Moist Heat;Therapeutic exercise;Therapeutic activities   PT Next Visit Plan Int cervical traction at 15#(99 sec on and 5 sec off) x 15 minutes; Left shoulder ROM.        Problem List Patient Active Problem List   Diagnosis Date Noted  . Dizziness and giddiness 08/09/2014  . Morbid obesity ([place]) 07/25/2014  . Orthostatic hypotension 07/07/2014  . Upper airway cough syndrome 06/26/2014  . Diarrhea 05/02/2014  . Dizziness 04/12/2014  . Neuropathy ([place])   . Rash and nonspecific skin eruption 08/30/2013  . Paresthesia of both feet 08/30/2013  . Blurred vision, bilateral 02/15/2013  . Eye pain 02/15/2013  . Sinusitis, chronic 10/07/2012  . Left shoulder pain 04/15/2012  . Preventative health care 10/22/2011  . Cervical disc disease 10/22/2011  . Bilateral hand pain 08/05/2011  . Reflux 06/29/2011  . Bloating 06/29/2011  . Back pain 03/13/2011  . Vertigo 01/04/2011  . Nystagmus 01/04/2011  . Insomnia 10/21/2010  . CONSTIPATION 08/17/2009  . VITAMIN D DEFICIENCY 05/15/2009  . DYSPNEA 03/13/2009  . Essential hypertension, benign 01/30/2009  . DEPRESSION 01/16/2009  . Irritable bowel syndrome 01/16/2009  . OSTEOARTHRITIS 12/15/2008  . Diabetes ([place]) 11/03/2008  . Hyperlipidemia 11/03/2008  . Anxiety state 11/03/2008  . OVERACTIVE BLADDER 11/03/2008  . OSA (obstructive sleep apnea) 11/03/2008  . MURMUR 11/03/2008    Kethan Papadopoulos,CHRIS, PTA 10/17/2014, 3:02 PM  [facility] [address] [place], [place], [address] Phone: [phone]   Fax:  [phone]

## 2014-10-20 ENCOUNTER — Ambulatory Visit: Payer: Medicare Other | Admitting: *Deleted

## 2014-10-20 ENCOUNTER — Encounter: Payer: Self-pay | Admitting: *Deleted

## 2014-10-20 DIAGNOSIS — M25612 Stiffness of left shoulder, not elsewhere classified: Secondary | ICD-10-CM | POA: Diagnosis not present

## 2014-10-20 DIAGNOSIS — M47812 Spondylosis without myelopathy or radiculopathy, cervical region: Secondary | ICD-10-CM

## 2014-10-20 DIAGNOSIS — Z7409 Other reduced mobility: Secondary | ICD-10-CM | POA: Diagnosis not present

## 2014-10-20 DIAGNOSIS — M25512 Pain in left shoulder: Secondary | ICD-10-CM

## 2014-10-20 DIAGNOSIS — M436 Torticollis: Secondary | ICD-10-CM | POA: Diagnosis not present

## 2014-10-20 DIAGNOSIS — M542 Cervicalgia: Secondary | ICD-10-CM

## 2014-10-20 NOTE — Therapy (Signed)
[facility] [place], [place], [address] Phone: [phone]   Fax:  [phone]  Physical Therapy Treatment  Patient Details  Name: Laura [name] MRN: [id number] Date of Birth: 11/10/1945 Referring Provider:  Biagio Borg, MD  Encounter Date: 10/20/2014      PT End of Session - 10/20/14 1134    Visit Number 3   Number of Visits 12   Date for PT Re-Evaluation 11/29/14   PT Start Time 0947   PT Stop Time 1210   PT Time Calculation (min) 49 min      Past Medical History  Diagnosis Date  . Diabetes mellitus   . Hyperlipidemia   . Hypertension   . Anxiety   . Reflux   . Vertigo   . Arthritis     back of neck, bones spurs on neck  . Sleep apnea     wears CPAP occasionally  . Cervical disc disease   . Neuropathy [facility])     Past Surgical History  Procedure Laterality Date  . Bladder surgery    . Cholecystectomy    . Partial hysterectomy    . Fibroids removed      breast (both breasts)  . Breast surgery      There were no vitals filed for this visit.  Visit Diagnosis:  Neck pain  Left shoulder pain  Shoulder stiffness, left  Pain in joint, shoulder region, left  Cervical spondylosis without myelopathy      Subjective Assessment - 10/20/14 1122    Subjective Did good after last Rx. Less pain in my neck   Pertinent History Patient has had the vertigo before, it started in 2012 and was debilitating; had to get PT to rehabiltate from it. For this most recent course, she was hit by a new spell of dizziness getting up from the couch. Currently taking medication to assist in managing dizziness.    How long can you stand comfortably? 9/21- no longer feels dizzy when she is standing in place, main limits are when she is walking    How long can you walk comfortably? 9/21- 29ft, estimated    Patient Stated Goals get back to feeling like "...a normal person...", wants to get back to walking and exercise, wants  to get rid of head swimming feeling, get balance back    Currently in Pain? Yes   Pain Score 3    Pain Location Shoulder   Pain Orientation Left   Pain Descriptors / Indicators Aching   Pain Type Chronic pain   Pain Onset More than a month ago   Pain Frequency Constant   Aggravating Factors  Lifting LT shldr   Pain Relieving Factors Rxs   Multiple Pain Sites Yes                         [facility] Adult PT Treatment/Exercise - 10/20/14 0001    Electrical Stimulation   Electrical Stimulation Location Premod LT shldr x 15 mins 80-150 hz   Electrical Stimulation Goals Pain   Ultrasound   Ultrasound Location LT UT and levator scap   Ultrasound Parameters 1.5 w/cm2 x10 mins   Ultrasound Goals Pain   Traction   Type of Traction Cervical   Min (lbs) 5   Max (lbs) 10   Hold Time 99   Rest Time 5   Time 15   Manual Therapy   Manual Therapy Soft tissue mobilization;Passive ROM;Myofascial release  Soft tissue mobilization STW/ TPR to LT UT and levator with pt sitting                  PT Short Term Goals - 10/12/14 1536    PT SHORT TERM GOAL #1   Title Patient will state that she consistently has no more than 2/10 dizziness and no more than 4/10 unsteadiness    Baseline 9/21- 99.9% of the time she is not dizzy and very; around 3-4/10 unsteadiness. Dizziness comes in spurts but when it does come it is very mild and short.    Time 2   Period Weeks   Status Achieved   PT SHORT TERM GOAL #2   Title Patient will demonstrate an improvement of at least 10 degrees on all cervical planes with pain 0/10   Time 2   Period Weeks   Status On-going   PT SHORT TERM GOAL #3   Title Patient will correctly and consistently perform appropriate HEP, to be updated PRN    Baseline 9/21- patient reports that she is typically adherent but has not been the past couple of days due to being busy with lots of MD appointments    Time 2   Period Weeks   Status Achieved            PT Long Term Goals - 10/12/14 1536    PT LONG TERM GOAL #1   Title Patient will experience no more than 1/10 dizziness or unsteadiness during all functional tasks and activities    Time 5   Period Weeks   Status On-going   PT LONG TERM GOAL #2   Title Patient will be able to complete TUG in 8 seconds on a consistent basis    Baseline 9/21- 11.3 at best    Time 5   Period Weeks   Status On-going   PT LONG TERM GOAL #3   Title Patient will score at least 54 on BERG balance test   Time 5   Period Weeks   Status Achieved   PT LONG TERM GOAL #4   Title Patient to report that she has been able to return to regular light-moderate activities at the gym at least 3 days each week with no exacerbation of symptoms    Baseline 9/21- has not tried going back to gym    Time 5   Period Weeks   Status On-going               Plan - 10/20/14 1205    Clinical Impression Statement Pt did great today with Rx. She did well with STW  and had less notable tightness in LT UE and Levator scap.She did well with traction again at 10#s.   Pt will benefit from skilled therapeutic intervention in order to improve on the following deficits Pain;Decreased range of motion;Decreased strength;Dizziness   Rehab Potential Good   PT Frequency 1x / week   PT Duration 4 weeks   PT Treatment/Interventions Traction;Ultrasound;Electrical Stimulation;Moist Heat;Therapeutic exercise;Therapeutic activities   PT Next Visit Plan Int cervical traction at 15#(99 sec on and 5 sec off) x 15 minutes; Left shoulder ROM. Stayed at 10#s today with cerv traction and did well   Consulted and Agree with Plan of Care Patient        Problem List Patient Active Problem List   Diagnosis Date Noted  . Dizziness and giddiness 08/09/2014  . Morbid obesity ([place]) 07/25/2014  . Orthostatic hypotension 07/07/2014  . Upper airway cough  syndrome 06/26/2014  . Diarrhea 05/02/2014  . Dizziness 04/12/2014  . Neuropathy ([place])   . Rash  and nonspecific skin eruption 08/30/2013  . Paresthesia of both feet 08/30/2013  . Blurred vision, bilateral 02/15/2013  . Eye pain 02/15/2013  . Sinusitis, chronic 10/07/2012  . Left shoulder pain 04/15/2012  . Preventative health care 10/22/2011  . Cervical disc disease 10/22/2011  . Bilateral hand pain 08/05/2011  . Reflux 06/29/2011  . Bloating 06/29/2011  . Back pain 03/13/2011  . Vertigo 01/04/2011  . Nystagmus 01/04/2011  . Insomnia 10/21/2010  . CONSTIPATION 08/17/2009  . VITAMIN D DEFICIENCY 05/15/2009  . DYSPNEA 03/13/2009  . Essential hypertension, benign 01/30/2009  . DEPRESSION 01/16/2009  . Irritable bowel syndrome 01/16/2009  . OSTEOARTHRITIS 12/15/2008  . Diabetes ([place]) 11/03/2008  . Hyperlipidemia 11/03/2008  . Anxiety state 11/03/2008  . OVERACTIVE BLADDER 11/03/2008  . OSA (obstructive sleep apnea) 11/03/2008  . MURMUR 11/03/2008    Lamyia Cdebaca,CHRIS, PTA 10/20/2014, 12:38 PM  [facility] [address] [place], [place], [address] Phone: [phone]   Fax:  [phone]

## 2014-10-24 ENCOUNTER — Ambulatory Visit (HOSPITAL_COMMUNITY): Payer: Medicare Other | Admitting: Physical Therapy

## 2014-10-24 DIAGNOSIS — Z9181 History of falling: Secondary | ICD-10-CM

## 2014-10-24 DIAGNOSIS — R269 Unspecified abnormalities of gait and mobility: Secondary | ICD-10-CM | POA: Diagnosis not present

## 2014-10-24 DIAGNOSIS — R2681 Unsteadiness on feet: Secondary | ICD-10-CM

## 2014-10-24 DIAGNOSIS — H8110 Benign paroxysmal vertigo, unspecified ear: Secondary | ICD-10-CM

## 2014-10-24 DIAGNOSIS — Z7409 Other reduced mobility: Secondary | ICD-10-CM | POA: Diagnosis not present

## 2014-10-24 NOTE — Therapy (Signed)
[place], [place], [address] Phone: [phone]   Fax:  [phone]  Physical Therapy Treatment  Patient Details  Name: Laura [name] MRN: [id number] Date of Birth: [birth year] Referring Provider:  Jessy Oto, MD  Encounter Date: 10/24/2014      PT End of Session - 10/24/14 1155    Visit Number 11  [place]    Number of Visits 12   Date for PT Re-Evaluation 11/01/14      Authorization Type Medicare/Medicaid (G-code done 8th session)   Authorization Time Period 09/04/14 to 11/04/14   Authorization - Visit Number 11   Authorization - Number of Visits 12   PT Start Time 1110   PT Stop Time 1150   PT Time Calculation (min) 40 min   Equipment Utilized During Treatment Gait belt   Activity Tolerance Patient tolerated treatment well   Behavior During Therapy [facility] for tasks assessed/performed      Past Medical History  Diagnosis Date  . Diabetes mellitus   . Hyperlipidemia   . Hypertension   . Anxiety   . Reflux   . Vertigo   . Arthritis     back of neck, bones spurs on neck  . Sleep apnea     wears CPAP occasionally  . Cervical disc disease   . Neuropathy [facility])     Past Surgical History  Procedure Laterality Date  . Bladder surgery    . Cholecystectomy    . Partial hysterectomy    . Fibroids removed      breast (both breasts)  . Breast surgery      There were no vitals filed for this visit.  Visit Diagnosis:  Benign paroxysmal positional vertigo, unspecified laterality  Unsteadiness  Abnormality of gait  Risk for falls      Subjective Assessment - 10/24/14 1111    Subjective Patient reports that she is feeling much better today, reports that therapy for her neck and shoulder is going well too.    Pertinent History Patient has had the vertigo before, it started in 2012 and was debilitating; had to get PT to rehabiltate from it. For this most recent course, she was hit by  a new spell of dizziness getting up from the couch. Currently taking medication to assist in managing dizziness.    Patient Stated Goals get back to feeling like "...a normal person...", wants to get back to walking and exercise, wants to get rid of head swimming feeling, get balance back    Currently in Pain? Yes   Pain Score 3    Pain Location Shoulder   Pain Orientation Left                         [facility] Adult PT Treatment/Exercise - 10/24/14 0001    Neck Exercises: Seated   Other Seated Exercise cervical and thoracic 3D excursions 1x15         Vestibular Treatment/Exercise - 10/24/14 0001    Eye/Head Exercise Vertical   Comment saccades on vertical, horizontal, diagonal planes; accomodation exercise; VOR exercises            Balance Exercises - 10/24/14 1153    Balance Exercises: Standing   Standing Eyes Opened Narrow base of support (BOS);Foam/compliant surface;Other (comment)  vertical and horizontal head turns    Tandem Stance Eyes open;Foam/compliant surface;Other (comment)  vertical and horizontal head turns    Gait with Head  Turns 3 reps;Other reps (comment)  vertical and horizontal head turns    Tandem Gait Forward;2 reps;Other (comment)  2x55ft    Other Standing Exercises sidestepping and backwards gait 2x106ft each            PT Education - 10/24/14 1155    Education provided Yes   Education Details plan for next session; educated that she may be able to be discharged if progress remains consistent; encouraged to speak to her MD regarding her concerns about medication side effects    Person(s) Educated Patient   Methods Explanation   Comprehension Verbalized understanding          PT Short Term Goals - 10/12/14 1536    PT SHORT TERM GOAL #1   Title Patient will state that she consistently has no more than 2/10 dizziness and no more than 4/10 unsteadiness    Baseline 9/21- 99.9% of the time she is not dizzy and very; around 3-4/10  unsteadiness. Dizziness comes in spurts but when it does come it is very mild and short.    Time 2   Period Weeks   Status Achieved   PT SHORT TERM GOAL #2   Title Patient will demonstrate an improvement of at least 10 degrees on all cervical planes with pain 0/10   Time 2   Period Weeks   Status On-going   PT SHORT TERM GOAL #3   Title Patient will correctly and consistently perform appropriate HEP, to be updated PRN    Baseline 9/21- patient reports that she is typically adherent but has not been the past couple of days due to being busy with lots of MD appointments    Time 2   Period Weeks   Status Achieved           PT Long Term Goals - 10/12/14 1536    PT LONG TERM GOAL #1   Title Patient will experience no more than 1/10 dizziness or unsteadiness during all functional tasks and activities    Time 5   Period Weeks   Status On-going   PT LONG TERM GOAL #2   Title Patient will be able to complete TUG in 8 seconds on a consistent basis    Baseline 9/21- 11.3 at best    Time 5   Period Weeks   Status On-going   PT LONG TERM GOAL #3   Title Patient will score at least 54 on BERG balance test   Time 5   Period Weeks   Status Achieved   PT LONG TERM GOAL #4   Title Patient to report that she has been able to return to regular light-moderate activities at the gym at least 3 days each week with no exacerbation of symptoms    Baseline 9/21- has not tried going back to gym    Time 5   Period Weeks   Status On-going               Plan - 10/24/14 1157    Clinical Impression Statement Continued with vestibular exercises and balance training today; patient somewhat more unsteady in [place] during balance exercises as compared to wide open spaces however did very well today overall. Continued with focus on head turns on unstable surfaces as well as gait based balance activities today. Patient reports she has felt very well  the past week, adn was advised that she might be  able to be discharged if she continues to feel much better.  Pt will benefit from skilled therapeutic intervention in order to improve on the following deficits Pain;Decreased range of motion;Decreased strength;Dizziness   Rehab Potential Good   PT Frequency 1x / week   PT Duration 4 weeks   PT Treatment/Interventions Traction;Ultrasound;Electrical Stimulation;Moist Heat;Therapeutic exercise;Therapeutic activities   PT Next Visit Plan continue gait and balance train        Problem List Patient Active Problem List   Diagnosis Date Noted  . Dizziness and giddiness 08/09/2014  . Morbid obesity ([place]) 07/25/2014  . Orthostatic hypotension 07/07/2014  . Upper airway cough syndrome 06/26/2014  . Diarrhea 05/02/2014  . Dizziness 04/12/2014  . Neuropathy ([place])   . Rash and nonspecific skin eruption 08/30/2013  . Paresthesia of both feet 08/30/2013  . Blurred vision, bilateral 02/15/2013  . Eye pain 02/15/2013  . Sinusitis, chronic 10/07/2012  . Left shoulder pain 04/15/2012  . Preventative health care 10/22/2011  . Cervical disc disease 10/22/2011  . Bilateral hand pain 08/05/2011  . Reflux 06/29/2011  . Bloating 06/29/2011  . Back pain 03/13/2011  . Vertigo 01/04/2011  . Nystagmus 01/04/2011  . Insomnia 10/21/2010  . CONSTIPATION 08/17/2009  . VITAMIN D DEFICIENCY 05/15/2009  . DYSPNEA 03/13/2009  . Essential hypertension, benign 01/30/2009  . DEPRESSION 01/16/2009  . Irritable bowel syndrome 01/16/2009  . OSTEOARTHRITIS 12/15/2008  . Diabetes ([place]) 11/03/2008  . Hyperlipidemia 11/03/2008  . Anxiety state 11/03/2008  . OVERACTIVE BLADDER 11/03/2008  . OSA (obstructive sleep apnea) 11/03/2008  . MURMUR 11/03/2008    Deniece Ree PT, DPT [phone]  [place] [address] [place], [place], [address] Phone: [phone]   Fax:  [phone]

## 2014-10-25 ENCOUNTER — Ambulatory Visit: Payer: Medicare Other | Admitting: Physical Therapy

## 2014-10-25 ENCOUNTER — Encounter: Payer: Self-pay | Admitting: Physical Therapy

## 2014-10-25 DIAGNOSIS — M436 Torticollis: Secondary | ICD-10-CM | POA: Diagnosis not present

## 2014-10-25 DIAGNOSIS — M25612 Stiffness of left shoulder, not elsewhere classified: Secondary | ICD-10-CM

## 2014-10-25 DIAGNOSIS — M542 Cervicalgia: Secondary | ICD-10-CM | POA: Diagnosis not present

## 2014-10-25 DIAGNOSIS — M25512 Pain in left shoulder: Secondary | ICD-10-CM | POA: Diagnosis not present

## 2014-10-25 DIAGNOSIS — M47812 Spondylosis without myelopathy or radiculopathy, cervical region: Secondary | ICD-10-CM

## 2014-10-25 DIAGNOSIS — Z7409 Other reduced mobility: Secondary | ICD-10-CM | POA: Diagnosis not present

## 2014-10-25 NOTE — Patient Instructions (Signed)
ROM: External / Internal Rotation - Wand   Holding wand with left hand palm up, push out from body with other hand, palm down. Keep both elbows bent. When stretch is felt, hold _5___ seconds. Repeat to other side, leading with same hand. Keep elbows bent. Repeat __10__ times per set. Do __2-3__ sets per session. Do __2__ sessions per day.  http://orth.exer.us/748   Copyright  VHI. All rights reserved.  ROM: Flexion - Wand (Supine)   Lie on back holding wand. Raise arms over head.  Repeat __10__ times per set. Do _2-3___ sets per session. Do _2___ sessions per day.  http://orth.exer.us/928   Copyright  VHI. All rights reserved.   

## 2014-10-25 NOTE — Therapy (Signed)
[facility] [place], [place], [address] Phone: [phone]   Fax:  [phone]  Physical Therapy Treatment  Patient Details  Name: Laura [name] MRN: [id number] Date of Birth: April 10, 1945 Referring Provider:  Biagio Borg, MD  Encounter Date: 10/25/2014      PT End of Session - 10/25/14 1408    Visit Number 4   Number of Visits 12   Date for PT Re-Evaluation 11/29/14   PT Start Time 1572   PT Stop Time 1443   PT Time Calculation (min) 45 min   Activity Tolerance Patient tolerated treatment well   Behavior During Therapy [facility] for tasks assessed/performed      Past Medical History  Diagnosis Date  . Diabetes mellitus   . Hyperlipidemia   . Hypertension   . Anxiety   . Reflux   . Vertigo   . Arthritis     back of neck, bones spurs on neck  . Sleep apnea     wears CPAP occasionally  . Cervical disc disease   . Neuropathy [facility])     Past Surgical History  Procedure Laterality Date  . Bladder surgery    . Cholecystectomy    . Partial hysterectomy    . Fibroids removed      breast (both breasts)  . Breast surgery      There were no vitals filed for this visit.  Visit Diagnosis:  Neck pain  Left shoulder pain  Shoulder stiffness, left  Pain in joint, shoulder region, left  Cervical spondylosis without myelopathy      Subjective Assessment - 10/25/14 1403    Subjective Patient reported feeling better after last treatment and had soreness after going to the [employer] to exercise on a upper body bike   Pertinent History --   How long can you stand comfortably? --   How long can you walk comfortably? --   Patient Stated Goals --   Currently in Pain? Yes   Pain Score 2    Pain Location Shoulder  nceck   Pain Orientation Left   Pain Descriptors / Indicators Aching   Pain Type Chronic pain   Pain Onset More than a month ago   Pain Frequency Constant   Aggravating Factors  incresed activity in left  shoulder   Pain Relieving Factors rest                         [facility] Adult PT Treatment/Exercise - 10/25/14 0001    Electrical Stimulation   Electrical Stimulation Location left cervical.shoulder   Electrical Stimulation Action 80-150Hz    Electrical Stimulation Parameters IFC   Electrical Stimulation Goals Pain   Ultrasound   Ultrasound Location Left cervical/shoulder levator/UT   Ultrasound Parameters 1.5w/cm2/50%/82mhz x 56min   Ultrasound Goals Pain   Traction   Type of Traction Cervical   Min (lbs) 5   Max (lbs) 10   Hold Time 99   Rest Time 5   Time 15             Balance Exercises - 10/24/14 1153    Balance Exercises: Standing   Standing Eyes Opened Narrow base of support (BOS);Foam/compliant surface;Other (comment)  vertical and horizontal head turns    Tandem Stance Eyes open;Foam/compliant surface;Other (comment)  vertical and horizontal head turns    Gait with Head Turns 3 reps;Other reps (comment)  vertical and horizontal head turns    Tandem Gait Forward;2 reps;Other (comment)  2x3ft    Other Standing Exercises sidestepping and backwards gait 2x28ft each     Balance exercises have pulled into note from previous treatment and was not performed in today's treatment (10/25/14) Ladean Raya , PTA       PT Education - 10/25/14 1439    Education provided Yes   Education Details HEP for shoulder AAR[facility]   Person(s) Educated Patient   Methods Explanation;Demonstration;Handout   Comprehension Verbalized understanding          PT Short Term Goals - 10/25/14 1431    PT SHORT TERM GOAL #1   Title Ind with HEP.   Time 2   Period Weeks   Status On-going           PT Long Term Goals - 10/25/14 1431    PT LONG TERM GOAL #1   Title Perform ADL's with pain not > 3/10   Time 6   Period Weeks   Status On-going   PT LONG TERM GOAL #2   Title Active left shoulder flexion to 145 degrees so the patient can easily reach overhead    Time 6   Period Weeks   Status On-going   PT LONG TERM GOAL #3   Title Active ER to 70 degrees+ to allow for easily donning/doffing of apparel   Time 6   Period Weeks   Status On-going               Plan - 10/25/14 1412    Clinical Impression Statement Patient progressing with good response to therapy and feels like it has helped and decreased pain thus far. Patient feels 60% better overall. Patient able to use left shoulder and turn c-spne with greater ease for ADL's. Goals are ongoing due to limitations with painand  ROM .   Pt will benefit from skilled therapeutic intervention in order to improve on the following deficits Pain;Decreased range of motion;Decreased strength;Dizziness   Rehab Potential Good   PT Frequency 1x / week   PT Duration 4 weeks   PT Treatment/Interventions Traction;Ultrasound;Electrical Stimulation;Moist Heat;Therapeutic exercise;Therapeutic activities   PT Next Visit Plan cont with cervical traction, [place], ES and ROM for shoulder per MPT/ KX modifiers added   Consulted and Agree with Plan of Care Patient        Problem List Patient Active Problem List   Diagnosis Date Noted  . Dizziness and giddiness 08/09/2014  . Morbid obesity ([place]) 07/25/2014  . Orthostatic hypotension 07/07/2014  . Upper airway cough syndrome 06/26/2014  . Diarrhea 05/02/2014  . Dizziness 04/12/2014  . Neuropathy ([place])   . Rash and nonspecific skin eruption 08/30/2013  . Paresthesia of both feet 08/30/2013  . Blurred vision, bilateral 02/15/2013  . Eye pain 02/15/2013  . Sinusitis, chronic 10/07/2012  . Left shoulder pain 04/15/2012  . Preventative health care 10/22/2011  . Cervical disc disease 10/22/2011  . Bilateral hand pain 08/05/2011  . Reflux 06/29/2011  . Bloating 06/29/2011  . Back pain 03/13/2011  . Vertigo 01/04/2011  . Nystagmus 01/04/2011  . Insomnia 10/21/2010  . CONSTIPATION 08/17/2009  . VITAMIN D DEFICIENCY 05/15/2009  . DYSPNEA 03/13/2009  .  Essential hypertension, benign 01/30/2009  . DEPRESSION 01/16/2009  . Irritable bowel syndrome 01/16/2009  . OSTEOARTHRITIS 12/15/2008  . Diabetes ([place]) 11/03/2008  . Hyperlipidemia 11/03/2008  . Anxiety state 11/03/2008  . OVERACTIVE BLADDER 11/03/2008  . OSA (obstructive sleep apnea) 11/03/2008  . MURMUR 11/03/2008    Johan Antonacci P, PTA 10/25/2014, 2:48 PM  [facility] [place], [place], [address] Phone: [phone]   Fax:  [phone]

## 2014-10-30 ENCOUNTER — Ambulatory Visit (INDEPENDENT_AMBULATORY_CARE_PROVIDER_SITE_OTHER): Payer: Medicare Other | Admitting: Pulmonary Disease

## 2014-10-30 ENCOUNTER — Encounter: Payer: Self-pay | Admitting: Pulmonary Disease

## 2014-10-30 VITALS — BP 136/88 | HR 80 | Ht 64.0 in | Wt 211.4 lb

## 2014-10-30 DIAGNOSIS — G4733 Obstructive sleep apnea (adult) (pediatric): Secondary | ICD-10-CM | POA: Diagnosis not present

## 2014-10-30 NOTE — Progress Notes (Signed)
   Subjective:    Patient ID: Laura [name], female    DOB: 22-Nov-1945, 69 y.o.   MRN: [id number]  HPI  69 y.o with chronic cough for FU of obstructive sleep apnea.  10/30/2014   Chief Complaint  Patient presents with  . Follow-up    pt following for OSA: pt states she is not sleeping to well with other neck pains she is having. pt using CPAP everynight  for about  8 hours. pt states a couple weeks ago she was woken up by her snoring, unsure if the mask and pressure is any cause. DME: [facility]   Prior [place] pt-  sees MW for cough Got new CPAP 01/2014 - humidifier was heated & so returned it - 'fire hazard' - got another cpap - now humidifier stuck & wont detach Klonopin causes weird dreams Occ anxiety attack - q 62mnths - requiring lorazepam She wonders if her symptoms are due to BP meds- amlodipin + ARB No snoring, feels sleepy daytime  Download 10/2014 >> good usage, no residuals, avg pr 10 cm  NPSG 2007:  AHI 32/hr -on CPAP 15 cm , no download available for review  Review of Systems neg for any significant sore throat, dysphagia, itching, sneezing, nasal congestion or excess/ purulent secretions, fever, chills, sweats, unintended wt loss, pleuritic or exertional cp, hempoptysis, orthopnea pnd or change in chronic leg swelling. Also denies presyncope, palpitations, heartburn, abdominal pain, nausea, vomiting, diarrhea or change in bowel or urinary habits, dysuria,hematuria, rash, arthralgias, visual complaints, headache, numbness weakness or ataxia.     Objective:   Physical Exam  Gen. Pleasant, obese, in no distress ENT - no lesions, no post nasal drip Neck: No JVD, no thyromegaly, no carotid bruits Lungs: no use of accessory muscles, no dullness to percussion, decreased without rales or rhonchi  Cardiovascular: Rhythm regular, heart sounds  normal, no murmurs or gallops, no peripheral edema Musculoskeletal: No deformities, no cyanosis or clubbing , no tremors       Assessment &  Plan:

## 2014-10-30 NOTE — Assessment & Plan Note (Addendum)
Lower to auto CPAP 5-12 cm CPAP supplies will be renewed x 1 year Get humidifier checked by DME Residual sleepiness may be due to antidepressants/ lorazepam  Weight loss encouraged, compliance with goal of at least 4-6 hrs every night is the expectation. Advised against medications with sedative side effects Cautioned against driving when sleepy - understanding that sleepiness will vary on a day to day basis

## 2014-10-30 NOTE — Patient Instructions (Signed)
Change to auto CPAP 5-12 cm CPAP supplies will be renewed x 1 year Get humidifier checked by DME

## 2014-10-31 ENCOUNTER — Ambulatory Visit (HOSPITAL_COMMUNITY): Payer: Medicare Other | Admitting: Physical Therapy

## 2014-10-31 DIAGNOSIS — Z9181 History of falling: Secondary | ICD-10-CM

## 2014-10-31 DIAGNOSIS — H8110 Benign paroxysmal vertigo, unspecified ear: Secondary | ICD-10-CM | POA: Diagnosis not present

## 2014-10-31 DIAGNOSIS — R2681 Unsteadiness on feet: Secondary | ICD-10-CM | POA: Diagnosis not present

## 2014-10-31 DIAGNOSIS — Z7409 Other reduced mobility: Secondary | ICD-10-CM | POA: Diagnosis not present

## 2014-10-31 DIAGNOSIS — R269 Unspecified abnormalities of gait and mobility: Secondary | ICD-10-CM | POA: Diagnosis not present

## 2014-10-31 NOTE — Therapy (Signed)
Laura [name], [place], [address] Phone: [phone]   Fax:  [phone]  Physical Therapy Treatment (Discharge)  Patient Details  Name: Laura [name] MRN: [id number] Date of Birth: 02-02-1945 Referring Provider: Collene Gobble   Encounter Date: 10/31/2014      PT End of Session - 10/31/14 1334    Visit Number 12   Number of Visits 12   Date for PT Re-Evaluation 11/29/14   Authorization Type Medicare/Medicaid (G-code done 8th session)   Authorization Time Period 09/04/14 to 11/04/14   Authorization - Visit Number 12   Authorization - Number of Visits 12   PT Start Time 1106   PT Stop Time 1151   PT Time Calculation (min) 45 min   Activity Tolerance Patient tolerated treatment well   Behavior During Therapy [facility] for tasks assessed/performed      Past Medical History  Diagnosis Date  . Diabetes mellitus   . Hyperlipidemia   . Hypertension   . Anxiety   . Reflux   . Vertigo   . Arthritis     back of neck, bones spurs on neck  . Sleep apnea     wears CPAP occasionally  . Cervical disc disease   . Neuropathy [facility])     Past Surgical History  Procedure Laterality Date  . Bladder surgery    . Cholecystectomy    . Partial hysterectomy    . Fibroids removed      breast (both breasts)  . Breast surgery      There were no vitals filed for this visit.  Visit Diagnosis:  Benign paroxysmal positional vertigo, unspecified laterality  Unsteadiness  Abnormality of gait  Risk for falls  Impaired functional mobility and activity tolerance      Subjective Assessment - 10/31/14 1108    Subjective Patient reports that she is feeling very well today, still a bit confused about PT autonomy in terms of upcoming discharge in related to her progress. Also still a bit confused about the root cause for her dizziness and if she should continue wth PT. She has some concerns regarding her vision blurring and her  perception overall.  Reports she has been trying to get more active recently.    Pertinent History Patient has had the vertigo before, it started in 2012 and was debilitating; had to get PT to rehabiltate from it. For this most recent course, she was hit by a new spell of dizziness getting up from the couch. Currently taking medication to assist in managing dizziness.    How long can you stand comfortably? 10/18- no longer feeling dizzy just standing in place, feels more tired than dizzy    How long can you walk comfortably? 10/18- able to walk around her house for around an hour; able to walk outside maybe a city block. Also walking on treadmill up to 20 minutes before needing to stop.    Currently in Pain? Yes   Pain Score 2    Pain Location Shoulder   Pain Orientation Left            [facility] PT Assessment - 10/31/14 0001    Assessment   Referring Provider Collene Gobble    AROM   Cervical Flexion 62   Cervical Extension 37   Cervical - Right Side Bend 47   Cervical - Left Side Bend 48   Cervical - Right Rotation 64   Cervical - Left Rotation 70  Berg Balance Test   Sit to Stand Able to stand without using hands and stabilize independently   Standing Unsupported Able to stand safely 2 minutes   Sitting with Back Unsupported but Feet Supported on Floor or Stool Able to sit safely and securely 2 minutes   Stand to Sit Sits safely with minimal use of hands   Transfers Able to transfer safely, minor use of hands   Standing Unsupported with Eyes Closed Able to stand 10 seconds safely   Standing Ubsupported with Feet Together Able to place feet together independently and stand 1 minute safely   From Standing, Reach Forward with Outstretched Arm Can reach forward >12 cm safely (5")   From Standing Position, Pick up Object from Floor Able to pick up shoe safely and easily   From Standing Position, Turn to Look Behind Over each Shoulder Looks behind from both sides and weight shifts well    Turn 360 Degrees Able to turn 360 degrees safely in 4 seconds or less   Standing Unsupported, Alternately Place Feet on Step/Stool Able to stand independently and safely and complete 8 steps in 20 seconds   Standing Unsupported, One Foot in Front Able to take small step independently and hold 30 seconds   Standing on One Leg Able to lift leg independently and hold 5-10 seconds   Total Score 52   Timed Up and Go Test   TUG Comments 13, 10.6, 9.7                      Vestibular Treatment/Exercise - 10/31/14 0001    Eye/Head Exercise Vertical   Comment unable to provoke dizziness today with exception of cervical rotation                PT Education - 10/31/14 1334    Education provided Yes   Education Details progress with skilled PT services, DC today, advanced HEP    Person(s) Educated Patient   Methods Explanation;Handout   Comprehension Verbalized understanding          PT Short Term Goals - 10/31/14 1339    PT SHORT TERM GOAL #1   Title Patient to state taht she has no more than 2/10 dizziness and no more than 4/10 unsteaidness    Baseline - 99.9% of the time she is not dizzy and very; around 3-4/10 unsteadiness. Dizziness comes in spurts but when it does come it is very mild and short.    Time 2   Period Weeks   Status Achieved   PT SHORT TERM GOAL #2   Title Patient will demonstrate an improvement of at least 10 degrees on all cervical planes with pain 0/10   Time 2   Period Weeks   Status On-going   PT SHORT TERM GOAL #3   Title Patient will correctly and consistently perform appropriate HEP, to be updated PRN    Time 2   Period Weeks   Status Achieved           PT Long Term Goals - 10/31/14 1342    PT LONG TERM GOAL #1   Title Patient will experience no more than 1/10 dizziness or unsteadiness during all functional tasks and activities    Time 6   Period Weeks   Status On-going   PT LONG TERM GOAL #2   Title Patient will be able to  complete TUG in 8 seconds on a consistent basis    Status On-going  PT LONG TERM GOAL #3   Title Patient will score at least 54 on BERG balance test   Time 6   Period Weeks   Status Achieved   PT LONG TERM GOAL #4   Title Patient to report that she has been able to return to regular light-moderate activities at the gym at least 3 days each week with no exacerbation of symptoms    Baseline Has been able to return to the gym with light activity    Time 5   Period Weeks   Status On-going               Plan - 11/11/2014 1335    Clinical Impression Statement Discharge assessment performed today. Patient reports that she is continuing to feel better however becomes very anxious regarding her vertigo as well as her current status, became tearful and reported that she is frustrated that she is not as much improved as she was last time; patient re-assured and educated that she has made major progress with skilled PT services, skilled care no longer indicated for vertigo. Patient does not show significant improvement in cervical range of motion however does, as compared to her baseline,  show significant improvement in her ambulation and functional balance strategies; she does contniue to have mild dizziness however based on her scores on TUG and Merrilee Jansky is a realtievely low fall risk at this time. Patient is appropriate for DC today.    Pt will benefit from skilled therapeutic intervention in order to improve on the following deficits Pain;Decreased range of motion;Decreased strength;Dizziness   Rehab Potential Good   PT Next Visit Plan DC from [facility] (vertigo treatments in OPPT) today   Consulted and Agree with Plan of Care Patient          G-Codes - 11-11-2014 1354    Functional Assessment Tool Used Based on skilled clinical assessment  of balance, gait, vertigo symptoms    Functional Limitation Mobility: Walking and moving around   Mobility: Walking and Moving Around Goal Status  [phone]) At least 20 percent but less than 40 percent impaired, limited or restricted   Mobility: Walking and Moving Around Discharge Status [phone]) At least 20 percent but less than 40 percent impaired, limited or restricted      Problem List Patient Active Problem List   Diagnosis Date Noted  . Dizziness and giddiness 08/09/2014  . Morbid obesity ([place]) 07/25/2014  . Orthostatic hypotension 07/07/2014  . Upper airway cough syndrome 06/26/2014  . Diarrhea 05/02/2014  . Dizziness 04/12/2014  . Neuropathy ([place])   . Rash and nonspecific skin eruption 08/30/2013  . Paresthesia of both feet 08/30/2013  . Blurred vision, bilateral 02/15/2013  . Eye pain 02/15/2013  . Sinusitis, chronic 10/07/2012  . Left shoulder pain 04/15/2012  . Preventative health care 10/22/2011  . Cervical disc disease 10/22/2011  . Bilateral hand pain 08/05/2011  . Reflux 06/29/2011  . Bloating 06/29/2011  . Back pain 03/13/2011  . Vertigo 01/04/2011  . Nystagmus 01/04/2011  . Insomnia 10/21/2010  . CONSTIPATION 08/17/2009  . VITAMIN D DEFICIENCY 05/15/2009  . DYSPNEA 03/13/2009  . Essential hypertension, benign 01/30/2009  . DEPRESSION 01/16/2009  . Irritable bowel syndrome 01/16/2009  . OSTEOARTHRITIS 12/15/2008  . Diabetes ([place]) 11/03/2008  . Hyperlipidemia 11/03/2008  . Anxiety state 11/03/2008  . OVERACTIVE BLADDER 11/03/2008  . OSA (obstructive sleep apnea) 11/03/2008  . MURMUR 11/03/2008    PHYSICAL THERAPY DISCHARGE SUMMARY  Visits from Start of Care: 12  Current functional level related to goals / functional outcomes: Patient conitnues to have mild dizziness however has significantly improved in terms of gait and balance; she has progressed to the piont where she is more appropriate for DC to advanced HEP and no longer in need of skilled services to manage her condition.    Remaining deficits: Mild balance impairment, general muscle weakness, vertigo    Education / Equipment: Advanced  HEP, advised to further discuss her anxiety with MD  Plan: Patient agrees to discharge.  Patient goals were partially met. Patient is being discharged due to being pleased with the current functional level.  ?????       Deniece Ree PT, DPT [place] [address] [place], [place], [address] Phone: [phone]   Fax:  [phone]  Name: Laura [name] MRN: [id number] Date of Birth: 1945/08/13

## 2014-10-31 NOTE — Patient Instructions (Signed)
   Saccades - horizontal   Hold 2 pens or 2 fingers in front of you about 8-10" apart. Keeping your head still, look back and forth between the two objects, bringing each one into focus each time. Attempt to move as quickly as possible, making sure each object comes into focus.   Saccades - vertical    Hold 2 pens or 2 fingers in front of you about 8-10" apart. Keeping your head still, look up and down between the two objects, bringing each one into focus each time. Attempt to move as quickly as possible, making sure each object comes into focus.      Visual Saccades #2  Hold two targets in your hands, or use two distant targets, or use laser pointer to create two targets on your wall by clicking from one point to another.  Now move your eyes rapidly from one target to the other. Once you fixate your eyes on the target, move your head to face the target.  Perform one minute horizontally, vertically, and diagonally.    TANDEM STANCE WITH SUPPORT  Stand in front of a chair, table or counter top for support. Then place the heel of one foot so that it is touching the toes of the other foot. Maintain your balance in this position.   Hold as long as you can, then switch you feet. Repeat 3 times each side.     SINGLE LEG STANCE - SLS  Stand on one leg and maintain your balance. Hold for as long as you can, then switch your legs. Repeat 3 times each side, twice a day.      YOU MAY MAKE THE BALANCE EXERCISES HARDER BY ADDING HEAD ROTATION

## 2014-11-01 ENCOUNTER — Encounter: Payer: Self-pay | Admitting: Physical Therapy

## 2014-11-01 ENCOUNTER — Ambulatory Visit: Payer: Medicare Other | Admitting: Physical Therapy

## 2014-11-01 DIAGNOSIS — M25512 Pain in left shoulder: Secondary | ICD-10-CM | POA: Diagnosis not present

## 2014-11-01 DIAGNOSIS — M542 Cervicalgia: Secondary | ICD-10-CM

## 2014-11-01 DIAGNOSIS — Z7409 Other reduced mobility: Secondary | ICD-10-CM | POA: Diagnosis not present

## 2014-11-01 DIAGNOSIS — M47812 Spondylosis without myelopathy or radiculopathy, cervical region: Secondary | ICD-10-CM

## 2014-11-01 DIAGNOSIS — M25612 Stiffness of left shoulder, not elsewhere classified: Secondary | ICD-10-CM

## 2014-11-01 DIAGNOSIS — M436 Torticollis: Secondary | ICD-10-CM | POA: Diagnosis not present

## 2014-11-01 NOTE — Therapy (Signed)
[facility] [place], [place], [address] Phone: [phone]   Fax:  [phone]  Physical Therapy Treatment  Patient Details  Name: Laura [name] MRN: [id number] Date of Birth: 1945/11/02 Referring Provider: Collene Gobble   Encounter Date: 11/01/2014      PT End of Session - 11/01/14 1252    Visit Number 5   Number of Visits 12   Date for PT Re-Evaluation 11/29/14   PT Start Time 1230   PT Stop Time 1310   PT Time Calculation (min) 40 min   Activity Tolerance Patient tolerated treatment well   Behavior During Therapy [facility] for tasks assessed/performed      Past Medical History  Diagnosis Date  . Diabetes mellitus   . Hyperlipidemia   . Hypertension   . Anxiety   . Reflux   . Vertigo   . Arthritis     back of neck, bones spurs on neck  . Sleep apnea     wears CPAP occasionally  . Cervical disc disease   . Neuropathy [facility])     Past Surgical History  Procedure Laterality Date  . Bladder surgery    . Cholecystectomy    . Partial hysterectomy    . Fibroids removed      breast (both breasts)  . Breast surgery      There were no vitals filed for this visit.  Visit Diagnosis:  Left shoulder pain  Shoulder stiffness, left  Neck pain  Pain in joint, shoulder region, left  Cervical spondylosis without myelopathy  Neck stiffness      Subjective Assessment - 11/01/14 1233    Subjective Patient reported mild pain in right shoulder. Patient felt "wonderful " after last treatmnet. Patient had some soreness afrer doing the upper body bike again.   Limitations Lifting   Patient Stated Goals get back to prior level of function with no pain   Currently in Pain? Yes   Pain Score 2    Pain Location Shoulder   Pain Orientation Left   Pain Descriptors / Indicators Aching   Pain Type Chronic pain   Pain Onset More than a month ago   Pain Frequency Constant   Aggravating Factors  prolong activity in shoulder    Pain Relieving Factors rest            [facility] PT Assessment - 11/01/14 0001    AROM   Overall AROM  Within functional limits for tasks performed;Deficits   Overall AROM Comments left shoulder flexion AROM 150 degrees and ER AROM 36 degrees                     [facility] Adult PT Treatment/Exercise - 11/01/14 0001    Shoulder Exercises: Seated   External Rotation AAROM;Left  cane for AAROM 5 min   Shoulder Exercises: Pulleys   Flexion --  86mn   Electrical Stimulation   Electrical Stimulation Location left cervical.shoulder   Electrical Stimulation Action 80-150hz    Electrical Stimulation Parameters IFE    Electrical Stimulation Goals Pain   Traction   Type of Traction Cervical   Min (lbs) 5   Max (lbs) 10   Hold Time 99   Rest Time 5   Time 15                PT Education - 10/31/14 1334    Education provided Yes   Education Details progress with skilled PT services, DC today, advanced HEP  Person(s) Educated Patient   Methods Explanation;Handout   Comprehension Verbalized understanding          PT Short Term Goals - 11/01/14 1240    PT SHORT TERM GOAL #1   Title Ind with HEP.   Time 2   Period Weeks   Status Achieved           PT Long Term Goals - 11/01/14 1240    PT LONG TERM GOAL #1   Title Perform ADL's with pain not > 3/10.   Time 6   Period Weeks   Status On-going   PT LONG TERM GOAL #2   Title Active left shoulder flexion to 145 degrees so the patient can easily reach overhead   Time 6   Period Weeks   Status Achieved  AROM 150 degrees (11/01/14)   PT LONG TERM GOAL #3   Title Active ER to 70 degrees+ to allow for easily donning/doffing of apparel   Time 6   Period Weeks   Status On-going  AROM 36 degrees (11/01/14)               Plan - 11/01/14 1254    Clinical Impression Statement Patient has continued to respond well to treatment and felt good after last treatment. Patient met STG #1 and LTG #2.  patient is able to reach overhead WNL and has less pain overall. Patient needed to leave early thus limiting treatment today. Started AAROM for shoulder today. patient needs to work on ER due to limitations.   Pt will benefit from skilled therapeutic intervention in order to improve on the following deficits Pain;Decreased range of motion;Decreased strength;Dizziness   Rehab Potential Good   PT Frequency 1x / week   PT Duration 4 weeks   PT Treatment/Interventions Traction;Ultrasound;Electrical Stimulation;Moist Heat;Therapeutic exercise;Therapeutic activities   PT Next Visit Plan cont with POC   Consulted and Agree with Plan of Care Patient          G-Codes - 11-15-14 1354    Functional Assessment Tool Used Based on skilled clinical assessment  of balance, gait, vertigo symptoms    Functional Limitation Mobility: Walking and moving around   Mobility: Walking and Moving Around Goal Status [phone]) At least 20 percent but less than 40 percent impaired, limited or restricted   Mobility: Walking and Moving Around Discharge Status [phone]) At least 20 percent but less than 40 percent impaired, limited or restricted     G-Code has pulled over from other note, was not done here today Ladean Raya, PTA 11/01/14  Problem List Patient Active Problem List   Diagnosis Date Noted  . Dizziness and giddiness 08/09/2014  . Morbid obesity ([place]) 07/25/2014  . Orthostatic hypotension 07/07/2014  . Upper airway cough syndrome 06/26/2014  . Diarrhea 05/02/2014  . Dizziness 04/12/2014  . Neuropathy ([place])   . Rash and nonspecific skin eruption 08/30/2013  . Paresthesia of both feet 08/30/2013  . Blurred vision, bilateral 02/15/2013  . Eye pain 02/15/2013  . Sinusitis, chronic 10/07/2012  . Left shoulder pain 04/15/2012  . Preventative health care 10/22/2011  . Cervical disc disease 10/22/2011  . Bilateral hand pain 08/05/2011  . Reflux 06/29/2011  . Bloating 06/29/2011  . Back pain 03/13/2011   . Vertigo 01/04/2011  . Nystagmus 01/04/2011  . Insomnia 10/21/2010  . CONSTIPATION 08/17/2009  . VITAMIN D DEFICIENCY 05/15/2009  . DYSPNEA 03/13/2009  . Essential hypertension, benign 01/30/2009  . DEPRESSION 01/16/2009  . Irritable bowel syndrome 01/16/2009  . OSTEOARTHRITIS  12/15/2008  . Diabetes ([place]) 11/03/2008  . Hyperlipidemia 11/03/2008  . Anxiety state 11/03/2008  . OVERACTIVE BLADDER 11/03/2008  . OSA (obstructive sleep apnea) 11/03/2008  . MURMUR 11/03/2008    Kariya Lavergne P, PTA 11/01/2014, 1:25 PM  [facility] [address] [place], [place], [address] Phone: [phone]   Fax:  [phone]  Name: BENNIE SCAFF MRN: [id number] Date of Birth: May 18, 1945

## 2014-11-06 DIAGNOSIS — M25512 Pain in left shoulder: Secondary | ICD-10-CM | POA: Diagnosis not present

## 2014-11-06 DIAGNOSIS — M19012 Primary osteoarthritis, left shoulder: Secondary | ICD-10-CM | POA: Diagnosis not present

## 2014-11-07 ENCOUNTER — Ambulatory Visit: Payer: Medicare Other | Admitting: Physical Therapy

## 2014-11-07 ENCOUNTER — Encounter (HOSPITAL_COMMUNITY): Payer: Self-pay | Admitting: Physical Therapy

## 2014-11-07 DIAGNOSIS — M25512 Pain in left shoulder: Secondary | ICD-10-CM | POA: Diagnosis not present

## 2014-11-07 DIAGNOSIS — M47812 Spondylosis without myelopathy or radiculopathy, cervical region: Secondary | ICD-10-CM | POA: Diagnosis not present

## 2014-11-07 DIAGNOSIS — Z7409 Other reduced mobility: Secondary | ICD-10-CM | POA: Diagnosis not present

## 2014-11-07 DIAGNOSIS — M542 Cervicalgia: Secondary | ICD-10-CM | POA: Diagnosis not present

## 2014-11-07 DIAGNOSIS — M25612 Stiffness of left shoulder, not elsewhere classified: Secondary | ICD-10-CM | POA: Diagnosis not present

## 2014-11-07 DIAGNOSIS — M436 Torticollis: Secondary | ICD-10-CM | POA: Diagnosis not present

## 2014-11-07 NOTE — Therapy (Signed)
[facility] [place], [place], [address] Phone: [phone]   Fax:  [phone]  Physical Therapy Treatment  Patient Details  Name: Laura [name] MRN: [id number] Date of Birth: 18-Feb-1945 Referring Provider: Collene Gobble   Encounter Date: 11/07/2014      PT End of Session - 11/07/14 1307    Visit Number 6   Number of Visits 12   Date for PT Re-Evaluation 11/29/14   PT Start Time 1017   PT Stop Time 5102   PT Time Calculation (min) 53 min   Activity Tolerance Patient tolerated treatment well   Behavior During Therapy [facility] for tasks assessed/performed      Past Medical History  Diagnosis Date  . Diabetes mellitus   . Hyperlipidemia   . Hypertension   . Anxiety   . Reflux   . Vertigo   . Arthritis     back of neck, bones spurs on neck  . Sleep apnea     wears CPAP occasionally  . Cervical disc disease   . Neuropathy [facility])     Past Surgical History  Procedure Laterality Date  . Bladder surgery    . Cholecystectomy    . Partial hysterectomy    . Fibroids removed      breast (both breasts)  . Breast surgery      There were no vitals filed for this visit.  Visit Diagnosis:  Left shoulder pain  Neck pain  Shoulder stiffness, left  Cervical spondylosis without myelopathy      Subjective Assessment - 11/07/14 1308    Subjective Patient saw Dr. Louanne Skye yesterday and would like her to get a home traction unit. He is very pleased with her progress so far including her shoulder strength. She is concerned her shoulder is stiffening up a little in flexion.    Patient Stated Goals get back to prior level of function with no pain   Currently in Pain? Yes   Pain Score 3    Pain Location Shoulder  and tip of shoulder blade   Pain Orientation Left   Pain Descriptors / Indicators Aching   Pain Type Chronic pain   Pain Onset More than a month ago   Pain Frequency Intermittent   Aggravating Factors  end range  activity   Pain Relieving Factors rest   Multiple Pain Sites Yes   Pain Score 3   Pain Location Neck   Pain Orientation Right;Left   Pain Descriptors / Indicators Aching;Sore   Pain Onset More than a month ago   Pain Frequency Intermittent   Aggravating Factors  sleeping   Pain Relieving Factors rest            [facility] PT Assessment - 11/07/14 0001    Assessment   Medical Diagnosis Cervical spondylosis and left shoulder cuff arthropathy.   AROM   Overall AROM Comments left shoulder active ER 38 deg                     [facility] Adult PT Treatment/Exercise - 11/07/14 0001    Self-Care   Self-Care Other Self-Care Comments   Other Self-Care Comments  Discussed home traction unit and requirements to obtain as well as TENS unit. Also discussed use of towel roll in pillow case for sleeping and demonstrated for patient.   Shoulder Exercises: Supine   Other Supine Exercises supine cane flex, ER/IR, horizontal add/abd; ABD all x 20 each   Electrical Stimulation  Electrical Stimulation Location left Art gallery manager IFC   Electrical Stimulation Parameters 80-150hz  x 15 min to tolerance   Electrical Stimulation Goals Pain   Traction   Type of Traction Cervical   Min (lbs) 5   Max (lbs) 12   Hold Time 99   Rest Time 5   Time 15                PT Education - 11/07/14 1611    Education provided Yes   Education Details supine cane   Person(s) Educated Patient   Methods Explanation;Demonstration;Handout   Comprehension Verbalized understanding;Returned demonstration          PT Short Term Goals - 11/01/14 1240    PT SHORT TERM GOAL #1   Title Ind with HEP.   Time 2   Period Weeks   Status Achieved           PT Long Term Goals - 11/07/14 1327    PT LONG TERM GOAL #1   Title Perform ADL's with pain not > 3/10.  fluctuates 2-4/10 as of 11/07/14   Time 6   Period Weeks   Status On-going   PT LONG TERM GOAL #2    Title Active left shoulder flexion to 145 degrees so the patient can easily reach overhead   Time 6   Period Weeks   Status Achieved   PT LONG TERM GOAL #3   Title Active ER to 70 degrees+ to allow for easily donning/doffing of apparel  38 deg 11/07/14   Time 6   Period Weeks   PT LONG TERM GOAL #4   Title Patient to report that she has been able to return to regular light-moderate activities at the gym at least 3 days each week with no exacerbation of symptoms    Baseline Has been able to return to the gym with light activity    Time 5   Period Weeks   Status On-going               Plan - 11/07/14 1612    Clinical Impression Statement Patient and her MD are pleased with her progress. She still has significant shoulder ER limitations. She is progressing with pain goal and has been able to return to the gym for lightweight activity.   Pt will benefit from skilled therapeutic intervention in order to improve on the following deficits Pain;Decreased range of motion;Decreased strength;Dizziness   Rehab Potential Good   PT Frequency 1x / week   PT Duration 4 weeks   PT Treatment/Interventions Traction;Ultrasound;Electrical Stimulation;Moist Heat;Therapeutic exercise;Therapeutic activities   PT Next Visit Plan continue working shoulder ER, traction and modalities prn for pain.   Consulted and Agree with Plan of Care Patient        Problem List Patient Active Problem List   Diagnosis Date Noted  . Dizziness and giddiness 08/09/2014  . Morbid obesity ([place]) 07/25/2014  . Orthostatic hypotension 07/07/2014  . Upper airway cough syndrome 06/26/2014  . Diarrhea 05/02/2014  . Dizziness 04/12/2014  . Neuropathy ([place])   . Rash and nonspecific skin eruption 08/30/2013  . Paresthesia of both feet 08/30/2013  . Blurred vision, bilateral 02/15/2013  . Eye pain 02/15/2013  . Sinusitis, chronic 10/07/2012  . Left shoulder pain 04/15/2012  . Preventative health care 10/22/2011  .  Cervical disc disease 10/22/2011  . Bilateral hand pain 08/05/2011  . Reflux 06/29/2011  . Bloating 06/29/2011  . Back pain 03/13/2011  . Vertigo  01/04/2011  . Nystagmus 01/04/2011  . Insomnia 10/21/2010  . CONSTIPATION 08/17/2009  . VITAMIN D DEFICIENCY 05/15/2009  . DYSPNEA 03/13/2009  . Essential hypertension, benign 01/30/2009  . DEPRESSION 01/16/2009  . Irritable bowel syndrome 01/16/2009  . OSTEOARTHRITIS 12/15/2008  . Diabetes ([place]) 11/03/2008  . Hyperlipidemia 11/03/2008  . Anxiety state 11/03/2008  . OVERACTIVE BLADDER 11/03/2008  . OSA (obstructive sleep apnea) 11/03/2008  . MURMUR 11/03/2008    Madelyn Flavors PT  11/07/2014, 4:22 PM  [facility] [address] [place], [place], [address] Phone: [phone]   Fax:  [phone]  Name: MARCHELL FROMAN MRN: [id number] Date of Birth: 07-24-1945

## 2014-11-09 DIAGNOSIS — H90A22 Sensorineural hearing loss, unilateral, left ear, with restricted hearing on the contralateral side: Secondary | ICD-10-CM | POA: Diagnosis not present

## 2014-11-09 DIAGNOSIS — H903 Sensorineural hearing loss, bilateral: Secondary | ICD-10-CM | POA: Insufficient documentation

## 2014-11-09 DIAGNOSIS — H905 Unspecified sensorineural hearing loss: Secondary | ICD-10-CM | POA: Insufficient documentation

## 2014-11-09 DIAGNOSIS — H832X2 Labyrinthine dysfunction, left ear: Secondary | ICD-10-CM | POA: Insufficient documentation

## 2014-11-10 DIAGNOSIS — G4733 Obstructive sleep apnea (adult) (pediatric): Secondary | ICD-10-CM | POA: Diagnosis not present

## 2014-11-13 ENCOUNTER — Ambulatory Visit: Payer: Medicare Other | Admitting: Physical Therapy

## 2014-11-13 DIAGNOSIS — M47812 Spondylosis without myelopathy or radiculopathy, cervical region: Secondary | ICD-10-CM | POA: Diagnosis not present

## 2014-11-13 DIAGNOSIS — M542 Cervicalgia: Secondary | ICD-10-CM | POA: Diagnosis not present

## 2014-11-13 DIAGNOSIS — M25512 Pain in left shoulder: Secondary | ICD-10-CM

## 2014-11-13 DIAGNOSIS — Z7409 Other reduced mobility: Secondary | ICD-10-CM | POA: Diagnosis not present

## 2014-11-13 DIAGNOSIS — M436 Torticollis: Secondary | ICD-10-CM | POA: Diagnosis not present

## 2014-11-13 DIAGNOSIS — M25612 Stiffness of left shoulder, not elsewhere classified: Secondary | ICD-10-CM | POA: Diagnosis not present

## 2014-11-13 NOTE — Therapy (Signed)
[facility] [place], [place], [address] Phone: [phone]   Fax:  [phone]  Physical Therapy Treatment  Patient Details  Name: Laura [name] MRN: [id number] Date of Birth: 23-Sep-1945 Referring Provider: Collene Gobble   Encounter Date: 11/13/2014      PT End of Session - 11/13/14 1237    Visit Number 7   Number of Visits 12   Date for PT Re-Evaluation 11/29/14   PT Start Time 1232   PT Stop Time 1325   PT Time Calculation (min) 53 min   Activity Tolerance Patient tolerated treatment well   Behavior During Therapy [facility] for tasks assessed/performed      Past Medical History  Diagnosis Date  . Diabetes mellitus   . Hyperlipidemia   . Hypertension   . Anxiety   . Reflux   . Vertigo   . Arthritis     back of neck, bones spurs on neck  . Sleep apnea     wears CPAP occasionally  . Cervical disc disease   . Neuropathy [facility])     Past Surgical History  Procedure Laterality Date  . Bladder surgery    . Cholecystectomy    . Partial hysterectomy    . Fibroids removed      breast (both breasts)  . Breast surgery      There were no vitals filed for this visit.  Visit Diagnosis:  Left shoulder pain  Shoulder stiffness, left  Neck pain      Subjective Assessment - 11/13/14 1238    Subjective Patient reports she has had increased pain since last visit. More in the neck than the shoulder, but into bil shoulders and wrists. Today it is mostly just the left shoulder.    Patient Stated Goals get back to prior level of function with no pain   Currently in Pain? Yes   Pain Score 3    Pain Location Shoulder   Pain Orientation Left   Pain Descriptors / Indicators Aching   Pain Type Chronic pain   Pain Onset More than a month ago                         [facility] Adult PT Treatment/Exercise - 11/13/14 0001    Modalities   Modalities Electrical Stimulation;Traction   Acupuncturist Stimulation Location left cervical.shoulder/scapula   Electrical Stimulation Action IFC   Electrical Stimulation Parameters 80-150 HZ x 15 min to tolerance   Electrical Stimulation Goals Pain   Traction   Type of Traction Cervical   Min (lbs) 5   Max (lbs) 10   Hold Time 99   Rest Time 5   Time 15   Manual Therapy   Manual Therapy Joint mobilization;Passive ROM   Joint Mobilization post g/h gd II/III   Passive ROM into ER L shoudler                  PT Short Term Goals - 11/01/14 1240    PT SHORT TERM GOAL #1   Title Ind with HEP.   Time 2   Period Weeks   Status Achieved           PT Long Term Goals - 11/07/14 1327    PT LONG TERM GOAL #1   Title Perform ADL's with pain not > 3/10.  fluctuates 2-4/10 as of 11/07/14   Time 6   Period Weeks   Status On-going  PT LONG TERM GOAL #2   Title Active left shoulder flexion to 145 degrees so the patient can easily reach overhead   Time 6   Period Weeks   Status Achieved   PT LONG TERM GOAL #3   Title Active ER to 70 degrees+ to allow for easily donning/doffing of apparel  38 deg 11/07/14   Time 6   Period Weeks   PT LONG TERM GOAL #4   Title Patient to report that she has been able to return to regular light-moderate activities at the gym at least 3 days each week with no exacerbation of symptoms    Baseline Has been able to return to the gym with light activity    Time 5   Period Weeks   Status On-going               Plan - 11/13/14 1311    Clinical Impression Statement Patient reports increased pain after last treatment, but pain did not happen right away. She reports she felt great when she left and was really relaxed and even took a two hour nap after therapy. She thinks pain started that night and then continued. Generally, she had improved to having pain intermittently.    PT Next Visit Plan continue working shoulder ER, traction and modalities prn for pain.   Consulted and Agree  with Plan of Care Patient        Problem List Patient Active Problem List   Diagnosis Date Noted  . Dizziness and giddiness 08/09/2014  . Morbid obesity ([place]) 07/25/2014  . Orthostatic hypotension 07/07/2014  . Upper airway cough syndrome 06/26/2014  . Diarrhea 05/02/2014  . Dizziness 04/12/2014  . Neuropathy ([place])   . Rash and nonspecific skin eruption 08/30/2013  . Paresthesia of both feet 08/30/2013  . Blurred vision, bilateral 02/15/2013  . Eye pain 02/15/2013  . Sinusitis, chronic 10/07/2012  . Left shoulder pain 04/15/2012  . Preventative health care 10/22/2011  . Cervical disc disease 10/22/2011  . Bilateral hand pain 08/05/2011  . Reflux 06/29/2011  . Bloating 06/29/2011  . Back pain 03/13/2011  . Vertigo 01/04/2011  . Nystagmus 01/04/2011  . Insomnia 10/21/2010  . CONSTIPATION 08/17/2009  . VITAMIN D DEFICIENCY 05/15/2009  . DYSPNEA 03/13/2009  . Essential hypertension, benign 01/30/2009  . DEPRESSION 01/16/2009  . Irritable bowel syndrome 01/16/2009  . OSTEOARTHRITIS 12/15/2008  . Diabetes ([place]) 11/03/2008  . Hyperlipidemia 11/03/2008  . Anxiety state 11/03/2008  . OVERACTIVE BLADDER 11/03/2008  . OSA (obstructive sleep apnea) 11/03/2008  . MURMUR 11/03/2008    Madelyn Flavors PT  11/13/2014, 2:15 PM  [facility] [address] [place], [place], [address] Phone: [phone]   Fax:  [phone]  Name: Laura [name] MRN: [id number] Date of Birth: 1945-03-25

## 2014-11-14 ENCOUNTER — Encounter: Payer: Self-pay | Admitting: Pulmonary Disease

## 2014-11-16 ENCOUNTER — Ambulatory Visit: Payer: Medicare Other | Attending: Specialist | Admitting: Physical Therapy

## 2014-11-16 ENCOUNTER — Encounter: Payer: Self-pay | Admitting: Physical Therapy

## 2014-11-16 DIAGNOSIS — M436 Torticollis: Secondary | ICD-10-CM | POA: Diagnosis not present

## 2014-11-16 DIAGNOSIS — M25612 Stiffness of left shoulder, not elsewhere classified: Secondary | ICD-10-CM | POA: Insufficient documentation

## 2014-11-16 DIAGNOSIS — M47812 Spondylosis without myelopathy or radiculopathy, cervical region: Secondary | ICD-10-CM | POA: Insufficient documentation

## 2014-11-16 DIAGNOSIS — M25512 Pain in left shoulder: Secondary | ICD-10-CM | POA: Diagnosis not present

## 2014-11-16 DIAGNOSIS — M542 Cervicalgia: Secondary | ICD-10-CM | POA: Diagnosis not present

## 2014-11-16 NOTE — Therapy (Signed)
[facility] [place], [place], [address] Phone: [phone]   Fax:  [phone]  Physical Therapy Treatment  Patient Details  Name: Laura [name] MRN: [id number] Date of Birth: Mar 13, 1945 Referring Provider: Collene Gobble   Encounter Date: 11/16/2014      PT End of Session - 11/16/14 1155    Visit Number 8   Number of Visits 12   Date for PT Re-Evaluation 11/29/14   PT Start Time 1120   PT Stop Time 1204   PT Time Calculation (min) 44 min   Activity Tolerance Patient tolerated treatment well   Behavior During Therapy [facility] for tasks assessed/performed      Past Medical History  Diagnosis Date  . Diabetes mellitus   . Hyperlipidemia   . Hypertension   . Anxiety   . Reflux   . Vertigo   . Arthritis     back of neck, bones spurs on neck  . Sleep apnea     wears CPAP occasionally  . Cervical disc disease   . Neuropathy [facility])     Past Surgical History  Procedure Laterality Date  . Bladder surgery    . Cholecystectomy    . Partial hysterectomy    . Fibroids removed      breast (both breasts)  . Breast surgery      There were no vitals filed for this visit.  Visit Diagnosis:  Left shoulder pain  Shoulder stiffness, left  Neck pain  Cervical spondylosis without myelopathy  Pain in joint, shoulder region, left  Neck stiffness      Subjective Assessment - 11/16/14 1153    Subjective Reports that she has been using heat and ice at home today. Reports that the exercises especially cane ER she had sharp pain. Requested no traction today asking for [place].   Limitations Lifting   Patient Stated Goals get back to prior level of function with no pain   Currently in Pain? Yes   Pain Score 3    Pain Orientation Left   Pain Descriptors / Indicators Sore   Pain Type Chronic pain   Pain Onset More than a month ago            [facility] PT Assessment - 11/16/14 0001    Assessment   Medical Diagnosis Cervical  spondylosis and left shoulder cuff arthropathy.                     [facility] Adult PT Treatment/Exercise - 11/16/14 0001    Modalities   Modalities Electrical Stimulation;Ultrasound   Acupuncturist Location L shoulder/ UT   Electrical Stimulation Action IFC   Electrical Stimulation Parameters 1-10 Hz x15 min   Electrical Stimulation Goals Pain   Ultrasound   Ultrasound Location L Upper Trap   Ultrasound Parameters 1.5 w/cm2, 100%, 1 mhz x10 min   Ultrasound Goals Pain   Manual Therapy   Manual Therapy Joint mobilization;Passive ROM   Joint Mobilization L GII-III A/P glenohumeral joint mobilizatoins to decrease pain and increase ROM   Passive ROM PROM of L shoulder into flex/ER/IR gently with gentle holds at end range                  PT Short Term Goals - 11/01/14 1240    PT SHORT TERM GOAL #1   Title Ind with HEP.   Time 2   Period Weeks   Status Achieved  PT Long Term Goals - 11/07/14 1327    PT LONG TERM GOAL #1   Title Perform ADL's with pain not > 3/10.  fluctuates 2-4/10 as of 11/07/14   Time 6   Period Weeks   Status On-going   PT LONG TERM GOAL #2   Title Active left shoulder flexion to 145 degrees so the patient can easily reach overhead   Time 6   Period Weeks   Status Achieved   PT LONG TERM GOAL #3   Title Active ER to 70 degrees+ to allow for easily donning/doffing of apparel  38 deg 11/07/14   Time 6   Period Weeks   PT LONG TERM GOAL #4   Title Patient to report that she has been able to return to regular light-moderate activities at the gym at least 3 days each week with no exacerbation of symptoms    Baseline Has been able to return to the gym with light activity    Time 5   Period Weeks   Status On-going               Plan - 11/16/14 1200    Clinical Impression Statement Conservative treatment was tolerated by patient well with only complaint of pain with attempted L  inferior glide with glenohumeral mobilizations as well as with end range PROM flexion and ER. Increased L UT tightness noted today during manual therapy. Firm end feels noted during L shoulder PROM today. Normal modalities response noted following removal of the modalities. Patient experienced "a tiny bit" of soreness just inferior to the L acromian ridge.    Pt will benefit from skilled therapeutic intervention in order to improve on the following deficits Pain;Decreased range of motion;Decreased strength;Dizziness   Rehab Potential Good   PT Frequency 1x / week   PT Duration 4 weeks   PT Treatment/Interventions Traction;Ultrasound;Electrical Stimulation;Moist Heat;Therapeutic exercise;Therapeutic activities   PT Next Visit Plan continue working shoulder ER, traction and modalities prn for pain.   Consulted and Agree with Plan of Care Patient        Problem List Patient Active Problem List   Diagnosis Date Noted  . Dizziness and giddiness 08/09/2014  . Morbid obesity ([place]) 07/25/2014  . Orthostatic hypotension 07/07/2014  . Upper airway cough syndrome 06/26/2014  . Diarrhea 05/02/2014  . Dizziness 04/12/2014  . Neuropathy ([place])   . Rash and nonspecific skin eruption 08/30/2013  . Paresthesia of both feet 08/30/2013  . Blurred vision, bilateral 02/15/2013  . Eye pain 02/15/2013  . Sinusitis, chronic 10/07/2012  . Left shoulder pain 04/15/2012  . Preventative health care 10/22/2011  . Cervical disc disease 10/22/2011  . Bilateral hand pain 08/05/2011  . Reflux 06/29/2011  . Bloating 06/29/2011  . Back pain 03/13/2011  . Vertigo 01/04/2011  . Nystagmus 01/04/2011  . Insomnia 10/21/2010  . CONSTIPATION 08/17/2009  . VITAMIN D DEFICIENCY 05/15/2009  . DYSPNEA 03/13/2009  . Essential hypertension, benign 01/30/2009  . DEPRESSION 01/16/2009  . Irritable bowel syndrome 01/16/2009  . OSTEOARTHRITIS 12/15/2008  . Diabetes ([place]) 11/03/2008  . Hyperlipidemia 11/03/2008  . Anxiety  state 11/03/2008  . OVERACTIVE BLADDER 11/03/2008  . OSA (obstructive sleep apnea) 11/03/2008  . MURMUR 11/03/2008    Wynelle Fanny, PTA 11/16/2014, 12:10 PM  [facility] [address] [place], [place], [address] Phone: [phone]   Fax:  [phone]  Name: Laura [name] MRN: [id number] Date of Birth: January 20, 1945

## 2014-11-20 ENCOUNTER — Ambulatory Visit: Payer: Medicare Other | Admitting: Physical Therapy

## 2014-11-20 ENCOUNTER — Encounter: Payer: Self-pay | Admitting: Physical Therapy

## 2014-11-20 DIAGNOSIS — M25512 Pain in left shoulder: Secondary | ICD-10-CM | POA: Diagnosis not present

## 2014-11-20 DIAGNOSIS — M542 Cervicalgia: Secondary | ICD-10-CM | POA: Diagnosis not present

## 2014-11-20 DIAGNOSIS — M436 Torticollis: Secondary | ICD-10-CM | POA: Diagnosis not present

## 2014-11-20 DIAGNOSIS — M47812 Spondylosis without myelopathy or radiculopathy, cervical region: Secondary | ICD-10-CM | POA: Diagnosis not present

## 2014-11-20 DIAGNOSIS — M25612 Stiffness of left shoulder, not elsewhere classified: Secondary | ICD-10-CM | POA: Diagnosis not present

## 2014-11-20 NOTE — Therapy (Signed)
[facility] [place], [place], [address] Phone: [phone]   Fax:  [phone]  Physical Therapy Treatment  Patient Details  Name: Laura [name] MRN: [id number] Date of Birth: 1946/01/08 Referring Provider: Collene Gobble   Encounter Date: 11/20/2014      PT End of Session - 11/20/14 1518    Visit Number 9   Number of Visits 12   Date for PT Re-Evaluation 11/29/14   PT Start Time 1518   PT Stop Time 1602   PT Time Calculation (min) 44 min   Activity Tolerance Patient tolerated treatment well   Behavior During Therapy [facility] for tasks assessed/performed      Past Medical History  Diagnosis Date  . Diabetes mellitus   . Hyperlipidemia   . Hypertension   . Anxiety   . Reflux   . Vertigo   . Arthritis     back of neck, bones spurs on neck  . Sleep apnea     wears CPAP occasionally  . Cervical disc disease   . Neuropathy [facility])     Past Surgical History  Procedure Laterality Date  . Bladder surgery    . Cholecystectomy    . Partial hysterectomy    . Fibroids removed      breast (both breasts)  . Breast surgery      There were no vitals filed for this visit.  Visit Diagnosis:  Left shoulder pain  Shoulder stiffness, left  Neck pain  Cervical spondylosis without myelopathy  Pain in joint, shoulder region, left      Subjective Assessment - 11/20/14 1517    Subjective Had times over the weekend that she didn't have any pain and had more energy.   Limitations Lifting   Patient Stated Goals get back to prior level of function with no pain   Currently in Pain? Yes   Pain Score 2    Pain Location Shoulder   Pain Orientation Left   Pain Descriptors / Indicators Aching   Pain Type Chronic pain   Pain Onset More than a month ago            [facility] PT Assessment - 11/20/14 0001    Assessment   Medical Diagnosis Cervical spondylosis and left shoulder cuff arthropathy.                      [facility] Adult PT Treatment/Exercise - 11/20/14 0001    Modalities   Modalities Electrical Stimulation;Ultrasound   Acupuncturist Location L shoulder/ UT   Electrical Stimulation Action IFC   Electrical Stimulation Parameters 80-150 Hz x15 min   Electrical Stimulation Goals Pain   Ultrasound   Ultrasound Location L Upper Trap   Ultrasound Parameters 1.5 w/cm2, 100%, 1 mhz x10 min   Ultrasound Goals Pain   Manual Therapy   Manual Therapy Joint mobilization;Passive ROM;Myofascial release   Joint Mobilization L GII-III A/P/I glenohumeral joint mobilizatoins to decrease pain and increase ROM   Myofascial Release MFR to L posterior shoulder to decrease tightness and pain   Passive ROM PROM of L shoulder into flex/ER gently with gentle holds at end range                  PT Short Term Goals - 11/01/14 1240    PT SHORT TERM GOAL #1   Title Ind with HEP.   Time 2   Period Weeks   Status Achieved  PT Long Term Goals - 11/07/14 1327    PT LONG TERM GOAL #1   Title Perform ADL's with pain not > 3/10.  fluctuates 2-4/10 as of 11/07/14   Time 6   Period Weeks   Status On-going   PT LONG TERM GOAL #2   Title Active left shoulder flexion to 145 degrees so the patient can easily reach overhead   Time 6   Period Weeks   Status Achieved   PT LONG TERM GOAL #3   Title Active ER to 70 degrees+ to allow for easily donning/doffing of apparel  38 deg 11/07/14   Time 6   Period Weeks   PT LONG TERM GOAL #4   Title Patient to report that she has been able to return to regular light-moderate activities at the gym at least 3 days each week with no exacerbation of symptoms    Baseline Has been able to return to the gym with light activity    Time 5   Period Weeks   Status On-going               Plan - 11/20/14 1557    Clinical Impression Statement Patient tolerated today's treatment well today with only one complaint of  discomfort with PROM L shoulder ER in supine. Normal modalities response noted following removal of the modalities. Tolerated L glenohumeral joint mobilizations well with no complaint of discomfort. Experienced beginning of discomfort with PROM of L shoulder ER in supine and patient was transferred to sitting in which MFR to L posterior shoulder to decrease tightness and pain in region. Firm end feels noted during L shoulder PROM. Patient denied L shoulder pain or cervical pain following today's treatment.   Pt will benefit from skilled therapeutic intervention in order to improve on the following deficits Pain;Decreased range of motion;Decreased strength;Dizziness   Rehab Potential Good   PT Frequency 1x / week   PT Duration 4 weeks   PT Treatment/Interventions Traction;Ultrasound;Electrical Stimulation;Moist Heat;Therapeutic exercise;Therapeutic activities   PT Next Visit Plan continue working shoulder ER, traction and modalities prn for pain.   Consulted and Agree with Plan of Care Patient        Problem List Patient Active Problem List   Diagnosis Date Noted  . Dizziness and giddiness 08/09/2014  . Morbid obesity ([place]) 07/25/2014  . Orthostatic hypotension 07/07/2014  . Upper airway cough syndrome 06/26/2014  . Diarrhea 05/02/2014  . Dizziness 04/12/2014  . Neuropathy ([place])   . Rash and nonspecific skin eruption 08/30/2013  . Paresthesia of both feet 08/30/2013  . Blurred vision, bilateral 02/15/2013  . Eye pain 02/15/2013  . Sinusitis, chronic 10/07/2012  . Left shoulder pain 04/15/2012  . Preventative health care 10/22/2011  . Cervical disc disease 10/22/2011  . Bilateral hand pain 08/05/2011  . Reflux 06/29/2011  . Bloating 06/29/2011  . Back pain 03/13/2011  . Vertigo 01/04/2011  . Nystagmus 01/04/2011  . Insomnia 10/21/2010  . CONSTIPATION 08/17/2009  . VITAMIN D DEFICIENCY 05/15/2009  . DYSPNEA 03/13/2009  . Essential hypertension, benign 01/30/2009  . DEPRESSION  01/16/2009  . Irritable bowel syndrome 01/16/2009  . OSTEOARTHRITIS 12/15/2008  . Diabetes ([place]) 11/03/2008  . Hyperlipidemia 11/03/2008  . Anxiety state 11/03/2008  . OVERACTIVE BLADDER 11/03/2008  . OSA (obstructive sleep apnea) 11/03/2008  . MURMUR 11/03/2008    Wynelle Fanny, PTA 11/20/2014, 4:11 PM  [facility] [address] [place], [place], [address] Phone: [phone]   Fax:  [phone]  Name: Laura [name] MRN: [id number] Date of Birth: 1945/04/05

## 2014-11-23 ENCOUNTER — Ambulatory Visit: Payer: Medicare Other | Admitting: Physical Therapy

## 2014-11-23 ENCOUNTER — Encounter: Payer: Self-pay | Admitting: Physical Therapy

## 2014-11-23 DIAGNOSIS — M25512 Pain in left shoulder: Secondary | ICD-10-CM

## 2014-11-23 DIAGNOSIS — M542 Cervicalgia: Secondary | ICD-10-CM | POA: Diagnosis not present

## 2014-11-23 DIAGNOSIS — M47812 Spondylosis without myelopathy or radiculopathy, cervical region: Secondary | ICD-10-CM | POA: Diagnosis not present

## 2014-11-23 DIAGNOSIS — M25612 Stiffness of left shoulder, not elsewhere classified: Secondary | ICD-10-CM | POA: Diagnosis not present

## 2014-11-23 DIAGNOSIS — M436 Torticollis: Secondary | ICD-10-CM

## 2014-11-23 NOTE — Therapy (Signed)
[facility] [place], [place], [address] Phone: [phone]   Fax:  [phone]  Physical Therapy Treatment  Patient Details  Name: Laura [name] MRN: [id number] Date of Birth: 18-May-1945 Referring Provider: Collene Gobble   Encounter Date: 11/23/2014      PT End of Session - 11/23/14 1120    Visit Number 10   Number of Visits 12   Date for PT Re-Evaluation 11/29/14   PT Start Time 1120   PT Stop Time 1207   PT Time Calculation (min) 47 min   Activity Tolerance Patient tolerated treatment well   Behavior During Therapy [facility] for tasks assessed/performed      Past Medical History  Diagnosis Date  . Diabetes mellitus   . Hyperlipidemia   . Hypertension   . Anxiety   . Reflux   . Vertigo   . Arthritis     back of neck, bones spurs on neck  . Sleep apnea     wears CPAP occasionally  . Cervical disc disease   . Neuropathy [facility])     Past Surgical History  Procedure Laterality Date  . Bladder surgery    . Cholecystectomy    . Partial hysterectomy    . Fibroids removed      breast (both breasts)  . Breast surgery      There were no vitals filed for this visit.  Visit Diagnosis:  Left shoulder pain  Shoulder stiffness, left  Neck pain  Cervical spondylosis without myelopathy  Pain in joint, shoulder region, left  Neck stiffness      Subjective Assessment - 11/23/14 1119    Subjective Reports feeling good and would like to try traction again.   Limitations Lifting   Patient Stated Goals get back to prior level of function with no pain   Currently in Pain? Yes   Pain Score 1    Pain Location Shoulder   Pain Orientation Left   Pain Type Chronic pain   Pain Onset More than a month ago                         [facility] Adult PT Treatment/Exercise - 11/23/14 0001    Modalities   Modalities Ultrasound;Traction   Ultrasound   Ultrasound Location L Upper Trap   Ultrasound Parameters  1.5 w/cm2, 100%, 1 mhz x10 min   Ultrasound Goals Pain   Traction   Type of Traction Cervical   Min (lbs) 5   Max (lbs) 10   Hold Time 99   Rest Time 5   Time 15   Manual Therapy   Manual Therapy Joint mobilization;Soft tissue mobilization;Passive ROM   Joint Mobilization L GII-III A/P/I glenohumeral joint mobilizatoins to decrease pain and increase ROM   Soft tissue mobilization STW to L posterior shoulder to decrease pain and TP noted in the region   Passive ROM PROM of L shoulder into flex/ER/IR gently with gentle holds at end range                  PT Short Term Goals - 11/01/14 1240    PT SHORT TERM GOAL #1   Title Ind with HEP.   Time 2   Period Weeks   Status Achieved           PT Long Term Goals - 11/07/14 1327    PT LONG TERM GOAL #1   Title Perform ADL's with pain not >  3/10.  fluctuates 2-4/10 as of 11/07/14   Time 6   Period Weeks   Status On-going   PT LONG TERM GOAL #2   Title Active left shoulder flexion to 145 degrees so the patient can easily reach overhead   Time 6   Period Weeks   Status Achieved   PT LONG TERM GOAL #3   Title Active ER to 70 degrees+ to allow for easily donning/doffing of apparel  38 deg 11/07/14   Time 6   Period Weeks   PT LONG TERM GOAL #4   Title Patient to report that she has been able to return to regular light-moderate activities at the gym at least 3 days each week with no exacerbation of symptoms    Baseline Has been able to return to the gym with light activity    Time 5   Period Weeks   Status On-going               Plan - 11/23/14 1155    Clinical Impression Statement Patient tolerated today' s treatment well today with only minimal complaints of symptoms during today's treatment. Normal modalities response noted following removal of the modalities. Tolerated L glenohumeral joint mobilizations well with only once experience of pain at initiation of inferior glide. Patient also experienced an  undescribed sensation in the L posterior shoulder region during PROM of L shoulder into ER. Tolerated cervical traction fairly well today regarding her neck but reported a wave of "little" L anterior shoulder pain during the cervical traction. Encouraged patient to continue guidelines set at MD until she returns to him 12/04/2014. Denied cervical neck pain following treatment and only "a little bit" of L shoulder pain inferior to the acromian ridge following today's treatment.   Pt will benefit from skilled therapeutic intervention in order to improve on the following deficits Pain;Decreased range of motion;Decreased strength;Dizziness   Rehab Potential Good   PT Frequency 1x / week   PT Duration 4 weeks   PT Treatment/Interventions Traction;Ultrasound;Electrical Stimulation;Moist Heat;Therapeutic exercise;Therapeutic activities   PT Next Visit Plan continue working shoulder ER, traction and modalities prn for pain.   Consulted and Agree with Plan of Care Patient        Problem List Patient Active Problem List   Diagnosis Date Noted  . Dizziness and giddiness 08/09/2014  . Morbid obesity ([place]) 07/25/2014  . Orthostatic hypotension 07/07/2014  . Upper airway cough syndrome 06/26/2014  . Diarrhea 05/02/2014  . Dizziness 04/12/2014  . Neuropathy ([place])   . Rash and nonspecific skin eruption 08/30/2013  . Paresthesia of both feet 08/30/2013  . Blurred vision, bilateral 02/15/2013  . Eye pain 02/15/2013  . Sinusitis, chronic 10/07/2012  . Left shoulder pain 04/15/2012  . Preventative health care 10/22/2011  . Cervical disc disease 10/22/2011  . Bilateral hand pain 08/05/2011  . Reflux 06/29/2011  . Bloating 06/29/2011  . Back pain 03/13/2011  . Vertigo 01/04/2011  . Nystagmus 01/04/2011  . Insomnia 10/21/2010  . CONSTIPATION 08/17/2009  . VITAMIN D DEFICIENCY 05/15/2009  . DYSPNEA 03/13/2009  . Essential hypertension, benign 01/30/2009  . DEPRESSION 01/16/2009  . Irritable bowel  syndrome 01/16/2009  . OSTEOARTHRITIS 12/15/2008  . Diabetes ([place]) 11/03/2008  . Hyperlipidemia 11/03/2008  . Anxiety state 11/03/2008  . OVERACTIVE BLADDER 11/03/2008  . OSA (obstructive sleep apnea) 11/03/2008  . MURMUR 11/03/2008   Ahmed Prima, PTA 11/23/2014 12:17 PM  [facility] [address] [place], [place], [address] Phone: [phone]  Fax:  [phone]  Name: Laura [name] MRN: [id number] Date of Birth: 1945-01-31

## 2014-11-24 ENCOUNTER — Other Ambulatory Visit: Payer: Self-pay | Admitting: Pulmonary Disease

## 2014-11-24 DIAGNOSIS — G4733 Obstructive sleep apnea (adult) (pediatric): Secondary | ICD-10-CM

## 2014-11-24 DIAGNOSIS — Z9989 Dependence on other enabling machines and devices: Principal | ICD-10-CM

## 2014-11-27 ENCOUNTER — Ambulatory Visit: Payer: Medicare Other | Admitting: Physical Therapy

## 2014-11-27 ENCOUNTER — Encounter: Payer: Self-pay | Admitting: Physical Therapy

## 2014-11-27 DIAGNOSIS — M436 Torticollis: Secondary | ICD-10-CM | POA: Diagnosis not present

## 2014-11-27 DIAGNOSIS — M25612 Stiffness of left shoulder, not elsewhere classified: Secondary | ICD-10-CM

## 2014-11-27 DIAGNOSIS — M25512 Pain in left shoulder: Secondary | ICD-10-CM | POA: Diagnosis not present

## 2014-11-27 DIAGNOSIS — M47812 Spondylosis without myelopathy or radiculopathy, cervical region: Secondary | ICD-10-CM

## 2014-11-27 DIAGNOSIS — M542 Cervicalgia: Secondary | ICD-10-CM | POA: Diagnosis not present

## 2014-11-27 NOTE — Therapy (Signed)
[facility] [place], [place], [address] Phone: [phone]   Fax:  [phone]  Physical Therapy Treatment  Patient Details  Name: Laura [name] MRN: [id number] Date of Birth: [birth year] Referring Provider: Collene Gobble   Encounter Date: 11/27/2014      PT End of Session - 11/27/14 1555    Visit Number 11   Number of Visits 12   Date for PT Re-Evaluation 11/29/14   PT Start Time 1520   PT Stop Time 1602   PT Time Calculation (min) 42 min   Activity Tolerance Patient tolerated treatment well   Behavior During Therapy [facility] for tasks assessed/performed      Past Medical History  Diagnosis Date  . Diabetes mellitus   . Hyperlipidemia   . Hypertension   . Anxiety   . Reflux   . Vertigo   . Arthritis     back of neck, bones spurs on neck  . Sleep apnea     wears CPAP occasionally  . Cervical disc disease   . Neuropathy [facility])     Past Surgical History  Procedure Laterality Date  . Bladder surgery    . Cholecystectomy    . Partial hysterectomy    . Fibroids removed      breast (both breasts)  . Breast surgery      There were no vitals filed for this visit.  Visit Diagnosis:  Left shoulder pain  Shoulder stiffness, left  Neck pain  Cervical spondylosis without myelopathy      Subjective Assessment - 11/27/14 1553    Subjective Reports a nagging pain in neck region Saturday but felt good yesterday. When showering today under her arms patient reported having a semicircle pain under L Acromian process. Reports that she can tell she is improving. Reports pain with AAROM exercise that she was given but completes other exercises at home.   Limitations Lifting   Patient Stated Goals get back to prior level of function with no pain   Currently in Pain? Yes            [facility] PT Assessment - 11/27/14 0001    Assessment   Medical Diagnosis Cervical spondylosis and left shoulder cuff arthropathy.                      [facility] Adult PT Treatment/Exercise - 11/27/14 0001    Modalities   Modalities Ultrasound;Buyer, retail L shoulder/ UT   Chartered certified accountant IFC   Electrical Stimulation Parameters 1-10 Hz x15 min   Electrical Stimulation Goals Pain   Ultrasound   Ultrasound Location L Upper Trap   Ultrasound Parameters 1.5 w/cm2,100%, 1 mhz x10 min   Ultrasound Goals Pain   Manual Therapy   Manual Therapy Joint mobilization;Soft tissue mobilization;Passive ROM   Joint Mobilization L GII-III A/P/I glenohumeral joint mobilizatoins to decrease pain and increase ROM   Soft tissue mobilization STW to L posterior shoulder to decrease pain and TP noted in the region   Passive ROM PROM of L shoulder into flex/ER/IR gently with gentle holds at end range                  PT Short Term Goals - 11/01/14 1240    PT SHORT TERM GOAL #1   Title Ind with HEP.   Time 2   Period Weeks   Status Achieved  PT Long Term Goals - 11/07/14 1327    PT LONG TERM GOAL #1   Title Perform ADL's with pain not > 3/10.  fluctuates 2-4/10 as of 11/07/14   Time 6   Period Weeks   Status On-going   PT LONG TERM GOAL #2   Title Active left shoulder flexion to 145 degrees so the patient can easily reach overhead   Time 6   Period Weeks   Status Achieved   PT LONG TERM GOAL #3   Title Active ER to 70 degrees+ to allow for easily donning/doffing of apparel  38 deg 11/07/14   Time 6   Period Weeks   PT LONG TERM GOAL #4   Title Patient to report that she has been able to return to regular light-moderate activities at the gym at least 3 days each week with no exacerbation of symptoms    Baseline Has been able to return to the gym with light activity    Time 5   Period Weeks   Status On-going               Plan - 11/27/14 1559    Clinical Impression Statement Patient tolerated  today's treatment well today with only minimal verbal complaints of symptoms during treatment. Normal modalities response noted following removal of the modalities. Continues to tolerate L glenohumeral joint mobilizations well with only one instance of pain towards the end of the inferior glide movement under the L Acromian process. STW was also needed during PROM of L shoulder into ER when patient reported tightness close to the greater tuberucle region. L shoulder flexion AROM was measured as 124 deg and that is the point of tightness and pain reported. Firm end feels noted during PROM of the L shoulder in all directions.     Pt will benefit from skilled therapeutic intervention in order to improve on the following deficits Pain;Decreased range of motion;Decreased strength;Dizziness   Rehab Potential Good   PT Frequency 1x / week   PT Duration 4 weeks   PT Treatment/Interventions Traction;Ultrasound;Electrical Stimulation;Moist Heat;Therapeutic exercise;Therapeutic activities   PT Next Visit Plan Continue working shoulder ER, traction and modalities prn for pain as permitted by MD   Consulted and Agree with Plan of Care Patient        Problem List Patient Active Problem List   Diagnosis Date Noted  . Dizziness and giddiness 08/09/2014  . Morbid obesity ([place]) 07/25/2014  . Orthostatic hypotension 07/07/2014  . Upper airway cough syndrome 06/26/2014  . Diarrhea 05/02/2014  . Dizziness 04/12/2014  . Neuropathy ([place])   . Rash and nonspecific skin eruption 08/30/2013  . Paresthesia of both feet 08/30/2013  . Blurred vision, bilateral 02/15/2013  . Eye pain 02/15/2013  . Sinusitis, chronic 10/07/2012  . Left shoulder pain 04/15/2012  . Preventative health care 10/22/2011  . Cervical disc disease 10/22/2011  . Bilateral hand pain 08/05/2011  . Reflux 06/29/2011  . Bloating 06/29/2011  . Back pain 03/13/2011  . Vertigo 01/04/2011  . Nystagmus 01/04/2011  . Insomnia 10/21/2010  .  CONSTIPATION 08/17/2009  . VITAMIN D DEFICIENCY 05/15/2009  . DYSPNEA 03/13/2009  . Essential hypertension, benign 01/30/2009  . DEPRESSION 01/16/2009  . Irritable bowel syndrome 01/16/2009  . OSTEOARTHRITIS 12/15/2008  . Diabetes ([place]) 11/03/2008  . Hyperlipidemia 11/03/2008  . Anxiety state 11/03/2008  . OVERACTIVE BLADDER 11/03/2008  . OSA (obstructive sleep apnea) 11/03/2008  . MURMUR 11/03/2008   Mali Applegate MPT Ahmed Prima, [place] 11/27/2014 4:55 PM  [facility] [place], [place], [address] Phone: [phone]   Fax:  [phone]  Name: Laura [name] MRN: [id number] Date of Birth: 11-Jan-1946

## 2014-12-04 DIAGNOSIS — M25512 Pain in left shoulder: Secondary | ICD-10-CM | POA: Diagnosis not present

## 2014-12-04 DIAGNOSIS — M7542 Impingement syndrome of left shoulder: Secondary | ICD-10-CM | POA: Diagnosis not present

## 2014-12-04 DIAGNOSIS — M4722 Other spondylosis with radiculopathy, cervical region: Secondary | ICD-10-CM | POA: Diagnosis not present

## 2014-12-11 ENCOUNTER — Encounter: Payer: Self-pay | Admitting: Physical Therapy

## 2014-12-11 DIAGNOSIS — R260 Ataxic gait: Secondary | ICD-10-CM | POA: Diagnosis not present

## 2014-12-11 DIAGNOSIS — R42 Dizziness and giddiness: Secondary | ICD-10-CM | POA: Diagnosis not present

## 2014-12-11 DIAGNOSIS — H832X2 Labyrinthine dysfunction, left ear: Secondary | ICD-10-CM | POA: Diagnosis not present

## 2014-12-12 ENCOUNTER — Ambulatory Visit: Payer: Medicare Other | Admitting: Physical Therapy

## 2014-12-12 DIAGNOSIS — M542 Cervicalgia: Secondary | ICD-10-CM

## 2014-12-12 DIAGNOSIS — M25612 Stiffness of left shoulder, not elsewhere classified: Secondary | ICD-10-CM

## 2014-12-12 DIAGNOSIS — M436 Torticollis: Secondary | ICD-10-CM | POA: Diagnosis not present

## 2014-12-12 DIAGNOSIS — M25512 Pain in left shoulder: Secondary | ICD-10-CM | POA: Diagnosis not present

## 2014-12-12 DIAGNOSIS — M47812 Spondylosis without myelopathy or radiculopathy, cervical region: Secondary | ICD-10-CM | POA: Diagnosis not present

## 2014-12-12 NOTE — Therapy (Signed)
[facility] [place], [place], [address] Phone: [phone]   Fax:  [phone]  Physical Therapy Treatment  Patient Details  Name: Laura [name] MRN: [id number] Date of Birth: 1945/09/09 Referring Provider: Basil Dess MD  Encounter Date: 12/12/2014      PT End of Session - 12/12/14 1340    Visit Number 12   Number of Visits 16   Date for PT Re-Evaluation 01/12/15   PT Start Time 0354   PT Stop Time 1434   PT Time Calculation (min) 59 min   Activity Tolerance Patient tolerated treatment well;Patient limited by pain   Behavior During Therapy [facility] for tasks assessed/performed      Past Medical History  Diagnosis Date  . Diabetes mellitus   . Hyperlipidemia   . Hypertension   . Anxiety   . Reflux   . Vertigo   . Arthritis     back of neck, bones spurs on neck  . Sleep apnea     wears CPAP occasionally  . Cervical disc disease   . Neuropathy [facility])     Past Surgical History  Procedure Laterality Date  . Bladder surgery    . Cholecystectomy    . Partial hysterectomy    . Fibroids removed      breast (both breasts)  . Breast surgery      There were no vitals filed for this visit.  Visit Diagnosis:  Left shoulder pain - Plan: PT plan of care cert/re-cert  Shoulder stiffness, left - Plan: PT plan of care cert/re-cert  Neck pain - Plan: PT plan of care cert/re-cert      Subjective Assessment - 12/12/14 1341    Subjective Patient states she has been in a lot of pain since Thanksgiving evening. Her pain is in her neck, left shoulder and down the arm to almost her elbow. She reports she has found a place that will file her insurance for a home TENs and Traction unit, but has not been able to do it yet because the patient was not there. She plans to file asap.   Patient Stated Goals get back to prior level of function with no pain   Currently in Pain? Yes   Pain Score 4    Pain Location Shoulder   Pain  Orientation Left   Pain Descriptors / Indicators Aching   Pain Type Chronic pain   Pain Radiating Towards left arm toward elbow   Pain Onset More than a month ago   Pain Frequency Intermittent   Aggravating Factors  mostly at night when she lays down to sleep   Pain Relieving Factors tens, heat, traction   Effect of Pain on Daily Activities difficulty sleeping            [facility] PT Assessment - 12/12/14 0001    Assessment   Medical Diagnosis Cervical spondylosis and left shoulder cuff arthropathy.   Referring Provider Basil Dess MD   AROM   Overall AROM Comments L shoulder active ER 46 deg                      [facility] Adult PT Treatment/Exercise - 12/12/14 0001    Modalities   Modalities Electrical Stimulation;Moist Heat   Moist Heat Therapy   Number Minutes Moist Heat 15 Minutes   Moist Heat Location Shoulder   Electrical Stimulation   Electrical Stimulation Location L shoulder/ UT   Electrical Stimulation Action IFC   Electrical Stimulation  Parameters 80-150Hz  to tolerance x 15 min   Electrical Stimulation Goals Pain   Traction   Type of Traction Cervical   Min (lbs) 5   Max (lbs) 10   Hold Time 99   Rest Time 5   Time 15   Manual Therapy   Manual Therapy Joint mobilization;Passive ROM   Joint Mobilization L GII-III A/P/I glenohumeral joint mobilizatoins to decrease pain and increase ROM   Passive ROM PROM of L shoulder into flex/ER gently with gentle holds at end range                  PT Short Term Goals - 11/01/14 1240    PT SHORT TERM GOAL #1   Title Ind with HEP.   Time 2   Period Weeks   Status Achieved           PT Long Term Goals - 12/12/14 1351    PT LONG TERM GOAL #1   Title Perform ADL's with pain not > 3/10.   Time 6   Period Weeks   Status Achieved   PT LONG TERM GOAL #2   Title Active left shoulder flexion to 145 degrees so the patient can easily reach overhead   Time 6   Period Weeks   Status Achieved   PT  LONG TERM GOAL #3   Title Active ER to 70 degrees+ to allow for easily donning/doffing of apparel   Time 6   Period Weeks   Status On-going   PT LONG TERM GOAL #4   Title Patient to report that she has been able to return to regular light-moderate activities at the gym at least 3 days each week with no exacerbation of symptoms (modified)   Baseline Patient able to walk 2 miles, 5 days a week without pain   Period Weeks   Status Achieved               Plan - 12/12/14 1350    Clinical Impression Statement Patient states she is better overall than when she started PT and that now pain is intermittent where it used to be constant. She has met all her LTGs except her L shoulder ER. This has improved, but today her pain in the post shoulder limited manual therapy and TE. Her vertiigo was also aggravated with changes in position today.    Pt will benefit from skilled therapeutic intervention in order to improve on the following deficits Pain;Decreased range of motion;Decreased strength;Dizziness   Rehab Potential Good   PT Frequency 1x / week   PT Duration 4 weeks   PT Treatment/Interventions Traction;Ultrasound;Electrical Stimulation;Moist Heat;Therapeutic exercise;Therapeutic activities   PT Next Visit Plan continue working on L shoulder ER; Add Sdly ER, prone Ts/rows as tolerated. Traction and modalities prn and only until patient acquires home units.   Consulted and Agree with Plan of Care Patient        Problem List Patient Active Problem List   Diagnosis Date Noted  . Dizziness and giddiness 08/09/2014  . Morbid obesity ([place]) 07/25/2014  . Orthostatic hypotension 07/07/2014  . Upper airway cough syndrome 06/26/2014  . Diarrhea 05/02/2014  . Dizziness 04/12/2014  . Neuropathy ([place])   . Rash and nonspecific skin eruption 08/30/2013  . Paresthesia of both feet 08/30/2013  . Blurred vision, bilateral 02/15/2013  . Eye pain 02/15/2013  . Sinusitis, chronic 10/07/2012  .  Left shoulder pain 04/15/2012  . Preventative health care 10/22/2011  . Cervical disc disease 10/22/2011  .  Bilateral hand pain 08/05/2011  . Reflux 06/29/2011  . Bloating 06/29/2011  . Back pain 03/13/2011  . Vertigo 01/04/2011  . Nystagmus 01/04/2011  . Insomnia 10/21/2010  . CONSTIPATION 08/17/2009  . VITAMIN D DEFICIENCY 05/15/2009  . DYSPNEA 03/13/2009  . Essential hypertension, benign 01/30/2009  . DEPRESSION 01/16/2009  . Irritable bowel syndrome 01/16/2009  . OSTEOARTHRITIS 12/15/2008  . Diabetes ([place]) 11/03/2008  . Hyperlipidemia 11/03/2008  . Anxiety state 11/03/2008  . OVERACTIVE BLADDER 11/03/2008  . OSA (obstructive sleep apnea) 11/03/2008  . MURMUR 11/03/2008    Madelyn Flavors PT  12/12/2014, 4:01 PM  [facility] [address] [place], [place], [address] Phone: [phone]   Fax:  [phone]  Name: JALYNE BRODZINSKI MRN: [id number] Date of Birth: December 28, 1945

## 2014-12-18 ENCOUNTER — Ambulatory Visit: Payer: Medicare Other | Attending: Specialist | Admitting: Physical Therapy

## 2014-12-18 DIAGNOSIS — M25512 Pain in left shoulder: Secondary | ICD-10-CM | POA: Insufficient documentation

## 2014-12-18 DIAGNOSIS — M542 Cervicalgia: Secondary | ICD-10-CM | POA: Insufficient documentation

## 2014-12-18 DIAGNOSIS — M25511 Pain in right shoulder: Secondary | ICD-10-CM | POA: Insufficient documentation

## 2014-12-18 DIAGNOSIS — M25612 Stiffness of left shoulder, not elsewhere classified: Secondary | ICD-10-CM | POA: Diagnosis not present

## 2014-12-18 DIAGNOSIS — M47812 Spondylosis without myelopathy or radiculopathy, cervical region: Secondary | ICD-10-CM | POA: Diagnosis not present

## 2014-12-18 NOTE — Therapy (Signed)
[facility] [place], [place], [address] Phone: [phone]   Fax:  [phone]  Physical Therapy Treatment  Patient Details  Name: Laura [name] MRN: [id number] Date of Birth: [birth year] Referring Provider: Basil Dess MD  Encounter Date: 12/18/2014      PT End of Session - 12/18/14 1117    Visit Number 13   Number of Visits 16   Date for PT Re-Evaluation 01/12/15   PT Start Time 1117   PT Stop Time 1207   PT Time Calculation (min) 50 min   Activity Tolerance Patient tolerated treatment well   Behavior During Therapy [facility] for tasks assessed/performed      Past Medical History  Diagnosis Date  . Diabetes mellitus   . Hyperlipidemia   . Hypertension   . Anxiety   . Reflux   . Vertigo   . Arthritis     back of neck, bones spurs on neck  . Sleep apnea     wears CPAP occasionally  . Cervical disc disease   . Neuropathy [facility])     Past Surgical History  Procedure Laterality Date  . Bladder surgery    . Cholecystectomy    . Partial hysterectomy    . Fibroids removed      breast (both breasts)  . Breast surgery      There were no vitals filed for this visit.  Visit Diagnosis:  Left shoulder pain  Neck pain  Shoulder stiffness, left      Subjective Assessment - 12/18/14 1118    Subjective Patient reports that her R shoulder is giving her more pain than the L. Starting last Wedanesday it started when she was in bed. She is having muscle spasms and pain going into the R deltoid area.   Patient Stated Goals get back to prior level of function with no pain   Currently in Pain? Yes   Pain Score 3    Pain Location Shoulder   Pain Orientation Left   Pain Descriptors / Indicators Aching   Pain Type Chronic pain   Multiple Pain Sites Yes   Pain Score 2   Pain Location Neck   Pain Orientation Right;Left   Pain Descriptors / Indicators Aching;Sore   Pain Type Chronic pain   Pain Score 4   Pain Location  Shoulder   Pain Orientation Right   Pain Descriptors / Indicators Aching   Pain Type Acute pain   Pain Onset In the past 7 days   Pain Frequency Constant   Aggravating Factors  comes out of nowhere            [facility] PT Assessment - 12/18/14 0001    Assessment   Medical Diagnosis Cervical spondylosis and left shoulder cuff arthropathy.   AROM   Overall AROM Comments L shoulder 40 degrees in supine                     [facility] Adult PT Treatment/Exercise - 12/18/14 0001    Modalities   Modalities Electrical Stimulation;Traction   Acupuncturist Location B shoulders   Electrical Stimulation Action IFC    Electrical Stimulation Parameters 80-150 Hz to tolerance x 15 min   Electrical Stimulation Goals Pain   Traction   Type of Traction Cervical   Min (lbs) 5   Max (lbs) 10   Hold Time 99   Rest Time 5   Time 15   Manual Therapy  Passive ROM PROM to L shoulder in sitting                   PT Short Term Goals - 11/01/14 1240    PT SHORT TERM GOAL #1   Title Ind with HEP.   Time 2   Period Weeks   Status Achieved           PT Long Term Goals - 12/18/14 1212    PT LONG TERM GOAL #1   Title Perform ADL's with pain not > 3/10.   Time 6   Period Weeks   Status Achieved   PT LONG TERM GOAL #2   Title Active left shoulder flexion to 145 degrees so the patient can easily reach overhead   Time 6   Period Weeks   Status Achieved   PT LONG TERM GOAL #3   Title Active ER to 55 degrees+ to allow for easily donning/doffing of apparel   Time 6   Period Weeks   Status Revised   PT LONG TERM GOAL #4   Title Patient to report that she has been able to return to regular light-moderate activities at the gym at least 3 days each week with no exacerbation of symptoms (modified)   Baseline Patient able to walk 2 miles, 5 days a week without pain   Period Weeks   Status Achieved               Plan - 12/18/14  1209    Clinical Impression Statement Patient presented with increased pain in R shoulder today x 5 days. She responded well to therapy interventions reporting 0/10 pain in neck and shoulders at end of treatment. ER measured less than last week even after manual stretching. Goal to be revised from 70 to 55 degrees.   Pt will benefit from skilled therapeutic intervention in order to improve on the following deficits Pain;Decreased range of motion;Decreased strength;Dizziness   Rehab Potential Good   PT Frequency 1x / week   PT Duration 4 weeks   PT Treatment/Interventions Traction;Ultrasound;Electrical Stimulation;Moist Heat;Therapeutic exercise;Therapeutic activities   PT Next Visit Plan continue working on L shoulder ER; Add Sdly ER, prone Ts/rows as tolerated. Traction and modalities prn and only until patient acquires home units.   Consulted and Agree with Plan of Care Patient        Problem List Patient Active Problem List   Diagnosis Date Noted  . Dizziness and giddiness 08/09/2014  . Morbid obesity ([place]) 07/25/2014  . Orthostatic hypotension 07/07/2014  . Upper airway cough syndrome 06/26/2014  . Diarrhea 05/02/2014  . Dizziness 04/12/2014  . Neuropathy ([place])   . Rash and nonspecific skin eruption 08/30/2013  . Paresthesia of both feet 08/30/2013  . Blurred vision, bilateral 02/15/2013  . Eye pain 02/15/2013  . Sinusitis, chronic 10/07/2012  . Left shoulder pain 04/15/2012  . Preventative health care 10/22/2011  . Cervical disc disease 10/22/2011  . Bilateral hand pain 08/05/2011  . Reflux 06/29/2011  . Bloating 06/29/2011  . Back pain 03/13/2011  . Vertigo 01/04/2011  . Nystagmus 01/04/2011  . Insomnia 10/21/2010  . CONSTIPATION 08/17/2009  . VITAMIN D DEFICIENCY 05/15/2009  . DYSPNEA 03/13/2009  . Essential hypertension, benign 01/30/2009  . DEPRESSION 01/16/2009  . Irritable bowel syndrome 01/16/2009  . OSTEOARTHRITIS 12/15/2008  . Diabetes ([place]) 11/03/2008  .  Hyperlipidemia 11/03/2008  . Anxiety state 11/03/2008  . OVERACTIVE BLADDER 11/03/2008  . OSA (obstructive sleep apnea) 11/03/2008  . MURMUR 11/03/2008  Madelyn Flavors PT  12/18/2014, 12:15 PM  [facility] [address] [place], [place], [address] Phone: [phone]   Fax:  [phone]  Name: Laura [name] MRN: [id number] Date of Birth: May 31, 1945

## 2014-12-25 ENCOUNTER — Ambulatory Visit: Payer: Medicare Other | Admitting: Physical Therapy

## 2014-12-25 DIAGNOSIS — M25512 Pain in left shoulder: Secondary | ICD-10-CM

## 2014-12-25 DIAGNOSIS — M25511 Pain in right shoulder: Secondary | ICD-10-CM | POA: Diagnosis not present

## 2014-12-25 DIAGNOSIS — M542 Cervicalgia: Secondary | ICD-10-CM

## 2014-12-25 DIAGNOSIS — M25612 Stiffness of left shoulder, not elsewhere classified: Secondary | ICD-10-CM | POA: Diagnosis not present

## 2014-12-25 DIAGNOSIS — M47812 Spondylosis without myelopathy or radiculopathy, cervical region: Secondary | ICD-10-CM | POA: Diagnosis not present

## 2014-12-25 NOTE — Therapy (Signed)
[facility] [place], [place], [address] Phone: [phone]   Fax:  [phone]  Physical Therapy Treatment  Patient Details  Name: Laura [name] MRN: [id number] Date of Birth: [birth year] Referring Provider: Basil Dess MD  Encounter Date: 12/25/2014      PT End of Session - 12/25/14 1129    Visit Number 14   Number of Visits 16   Date for PT Re-Evaluation 01/12/15   Authorization Type Medicare/Medicaid (G-code done 8th session)   PT Start Time 1117   PT Stop Time 1206   PT Time Calculation (min) 49 min      Past Medical History  Diagnosis Date  . Diabetes mellitus   . Hyperlipidemia   . Hypertension   . Anxiety   . Reflux   . Vertigo   . Arthritis     back of neck, bones spurs on neck  . Sleep apnea     wears CPAP occasionally  . Cervical disc disease   . Neuropathy [facility])     Past Surgical History  Procedure Laterality Date  . Bladder surgery    . Cholecystectomy    . Partial hysterectomy    . Fibroids removed      breast (both breasts)  . Breast surgery      There were no vitals filed for this visit.  Visit Diagnosis:  Left shoulder pain  Neck pain  Shoulder stiffness, left  Cervical spondylosis without myelopathy  Pain in joint, shoulder region, left      Subjective Assessment - 12/25/14 1129    Subjective The patient reports that last wednesday she was in a great deal of pain.  She went to [facility] and was re-assessed for vertigo and did a lot of neck movements.  This significanly flared-up her neck and her right shoulder pain-level was a 7-8/10 and her right shou;lder muscles were in spam.   Pertinent History Patient has had the vertigo before, it started in 2012 and was debilitating; had to get PT to rehabiltate from it. For this most recent course, she was hit by a new spell of dizziness getting up from the couch. Currently taking medication to assist in managing dizziness.    Pain Score 4     Pain Location Neck   Pain Orientation Left;Right   Pain Descriptors / Indicators Aching   Pain Type Chronic pain   Pain Onset More than a month ago   Pain Score 4   Pain Location Shoulder   Pain Orientation Right;Left   Pain Descriptors / Indicators Aching;Spasm   Pain Type Chronic pain                                   PT Short Term Goals - 11/01/14 1240    PT SHORT TERM GOAL #1   Title Ind with HEP.   Time 2   Period Weeks   Status Achieved           PT Long Term Goals - 12/18/14 1212    PT LONG TERM GOAL #1   Title Perform ADL's with pain not > 3/10.   Time 6   Period Weeks   Status Achieved   PT LONG TERM GOAL #2   Title Active left shoulder flexion to 145 degrees so the patient can easily reach overhead   Time 6   Period Weeks   Status Achieved   PT  LONG TERM GOAL #3   Title Active ER to 55 degrees+ to allow for easily donning/doffing of apparel   Time 6   Period Weeks   Status Revised   PT LONG TERM GOAL #4   Title Patient to report that she has been able to return to regular light-moderate activities at the gym at least 3 days each week with no exacerbation of symptoms (modified)   Baseline Patient able to walk 2 miles, 5 days a week without pain   Period Weeks   Status Achieved               Problem List Patient Active Problem List   Diagnosis Date Noted  . Dizziness and giddiness 08/09/2014  . Morbid obesity ([place]) 07/25/2014  . Orthostatic hypotension 07/07/2014  . Upper airway cough syndrome 06/26/2014  . Diarrhea 05/02/2014  . Dizziness 04/12/2014  . Neuropathy ([place])   . Rash and nonspecific skin eruption 08/30/2013  . Paresthesia of both feet 08/30/2013  . Blurred vision, bilateral 02/15/2013  . Eye pain 02/15/2013  . Sinusitis, chronic 10/07/2012  . Left shoulder pain 04/15/2012  . Preventative health care 10/22/2011  . Cervical disc disease 10/22/2011  . Bilateral hand pain 08/05/2011  .  Reflux 06/29/2011  . Bloating 06/29/2011  . Back pain 03/13/2011  . Vertigo 01/04/2011  . Nystagmus 01/04/2011  . Insomnia 10/21/2010  . CONSTIPATION 08/17/2009  . VITAMIN D DEFICIENCY 05/15/2009  . DYSPNEA 03/13/2009  . Essential hypertension, benign 01/30/2009  . DEPRESSION 01/16/2009  . Irritable bowel syndrome 01/16/2009  . OSTEOARTHRITIS 12/15/2008  . Diabetes ([place]) 11/03/2008  . Hyperlipidemia 11/03/2008  . Anxiety state 11/03/2008  . OVERACTIVE BLADDER 11/03/2008  . OSA (obstructive sleep apnea) 11/03/2008  . MURMUR 11/03/2008   Treatment:  Pre-mod e'stim x 16 minutes to affected C-spine (5 sec on and 5 sec off) f/b STW/M x  8 minutes f/b Int traction at 10# (99 sec on and 5 sec off).  Patient felt better after treatment.  APPLEGATE, [place] MPT 12/25/2014, 12:07 PM  [facility] [address] [place], [place], [address] Phone: [phone]   Fax:  [phone]  Name: Laura [name] MRN: [id number] Date of Birth: 04-18-1945

## 2014-12-28 ENCOUNTER — Telehealth: Payer: Self-pay | Admitting: Internal Medicine

## 2014-12-28 ENCOUNTER — Other Ambulatory Visit: Payer: Self-pay | Admitting: Internal Medicine

## 2014-12-28 ENCOUNTER — Encounter: Payer: Self-pay | Admitting: Internal Medicine

## 2014-12-28 NOTE — Telephone Encounter (Signed)
Pt is requesting refill of Tramadol last Rx'd 11/06/2013, please advise in PCP's absence

## 2014-12-28 NOTE — Telephone Encounter (Signed)
States she sent a script request over for tramadol and script was sent back stating it had never been prescribed.  States that she has a hard copy where Dr. Jenny Reichmann had prescribed 11/07/2013.

## 2014-12-29 MED ORDER — TRAMADOL HCL 50 MG PO TABS: 50.0000 mg | ORAL_TABLET | Freq: Four times a day (QID) | ORAL | Status: DC | PRN

## 2014-12-29 NOTE — Telephone Encounter (Signed)
Medication refilled - suggest follow up with Dr. Jenny Reichmann for additional

## 2015-01-01 ENCOUNTER — Ambulatory Visit: Payer: Medicare Other | Admitting: Physical Therapy

## 2015-01-01 DIAGNOSIS — M25612 Stiffness of left shoulder, not elsewhere classified: Secondary | ICD-10-CM

## 2015-01-01 DIAGNOSIS — M25511 Pain in right shoulder: Secondary | ICD-10-CM

## 2015-01-01 DIAGNOSIS — M25512 Pain in left shoulder: Secondary | ICD-10-CM

## 2015-01-01 DIAGNOSIS — M542 Cervicalgia: Secondary | ICD-10-CM

## 2015-01-01 DIAGNOSIS — M47812 Spondylosis without myelopathy or radiculopathy, cervical region: Secondary | ICD-10-CM | POA: Diagnosis not present

## 2015-01-01 NOTE — Therapy (Signed)
[facility] [place], [place], [address] Phone: [phone]   Fax:  [phone]  Physical Therapy Treatment  Patient Details  Name: Laura [name] MRN: [id number] Date of Birth: 26-Feb-1945 Referring Provider: Basil Dess MD  Encounter Date: 01/01/2015      PT End of Session - 01/01/15 1123    Visit Number 15   Number of Visits 16   Date for PT Re-Evaluation 01/12/15   PT Start Time 1123   PT Stop Time 1209   PT Time Calculation (min) 46 min   Activity Tolerance Patient tolerated treatment well   Behavior During Therapy [facility] for tasks assessed/performed      Past Medical History  Diagnosis Date  . Diabetes mellitus   . Hyperlipidemia   . Hypertension   . Anxiety   . Reflux   . Vertigo   . Arthritis     back of neck, bones spurs on neck  . Sleep apnea     wears CPAP occasionally  . Cervical disc disease   . Neuropathy [facility])     Past Surgical History  Procedure Laterality Date  . Bladder surgery    . Cholecystectomy    . Partial hysterectomy    . Fibroids removed      breast (both breasts)  . Breast surgery      There were no vitals filed for this visit.  Visit Diagnosis:  Left shoulder pain  Neck pain  Shoulder stiffness, left  Pain in joint of right shoulder      Subjective Assessment - 01/01/15 1125    Subjective Patient continues to report pain from shoulder to shoulder and from scapula to scapula.   Patient Stated Goals get back to prior level of function with no pain   Currently in Pain? Yes   Pain Score 3    Pain Location Neck   Pain Orientation Left   Pain Descriptors / Indicators Aching   Pain Type Chronic pain   Pain Onset More than a month ago   Pain Frequency Intermittent   Aggravating Factors  vestibular evaluation   Pain Relieving Factors ice back, heat   Pain Score 4   Pain Location Shoulder   Pain Orientation Left;Right   Pain Descriptors / Indicators Sharp   Pain Type  Chronic pain   Pain Radiating Towards B scapula   Pain Onset More than a month ago   Pain Frequency Intermittent                         [facility] Adult PT Treatment/Exercise - 01/01/15 0001    Modalities   Modalities Electrical Stimulation;Traction   Acupuncturist Location B shoulders   Electrical Stimulation Action premod   Electrical Stimulation Parameters 80-150 Hz to tolerance x 17 min   Electrical Stimulation Goals Pain   Traction   Type of Traction Cervical   Min (lbs) 5   Max (lbs) 10   Hold Time 99   Rest Time 5   Time 15   Manual Therapy   Manual Therapy Soft tissue mobilization   Soft tissue mobilization to B UT and levator scap                  PT Short Term Goals - 11/01/14 1240    PT SHORT TERM GOAL #1   Title Ind with HEP.   Time 2   Period Weeks   Status Achieved  PT Long Term Goals - 01/01/15 1211    PT LONG TERM GOAL #1   Title Perform ADL's with pain not > 3/10.   Time 6   Period Weeks   Status Achieved   PT LONG TERM GOAL #2   Title Active left shoulder flexion to 145 degrees so the patient can easily reach overhead   Time 6   Status Achieved   PT LONG TERM GOAL #3   Title Active ER to 55 degrees+ to allow for easily donning/doffing of apparel  25 degrees    Time 6   Period Weeks   Status On-going   PT LONG TERM GOAL #4   Title Patient to report that she has been able to return to regular light-moderate activities at the gym at least 3 days each week with no exacerbation of symptoms (modified)   Time 5   Period Weeks   Status Achieved               Plan - 01/01/15 1205    Clinical Impression Statement Patient reports improved sx from last visit, but states she still has B shoulder pain and neck pain since vestibular evaluation. She had active TPs in B Upper traps and continues to have limited L shoulder ER to 25 deg. She responded well to estim and traction  reporting 0/10 pain in R shoulder and arm and 3/10 in L after Rx.   Pt will benefit from skilled therapeutic intervention in order to improve on the following deficits Pain;Decreased range of motion;Decreased strength;Dizziness   Rehab Potential Good   PT Frequency 1x / week   PT Duration 4 weeks   PT Treatment/Interventions Traction;Ultrasound;Electrical Stimulation;Moist Heat;Therapeutic exercise;Therapeutic activities   PT Next Visit Plan FOTO prn. Assess patient's current status. If she has returned to baseline, d/c, otherwise recert as needed.   Consulted and Agree with Plan of Care Patient        Problem List Patient Active Problem List   Diagnosis Date Noted  . Dizziness and giddiness 08/09/2014  . Morbid obesity ([place]) 07/25/2014  . Orthostatic hypotension 07/07/2014  . Upper airway cough syndrome 06/26/2014  . Diarrhea 05/02/2014  . Dizziness 04/12/2014  . Neuropathy ([place])   . Rash and nonspecific skin eruption 08/30/2013  . Paresthesia of both feet 08/30/2013  . Blurred vision, bilateral 02/15/2013  . Eye pain 02/15/2013  . Sinusitis, chronic 10/07/2012  . Left shoulder pain 04/15/2012  . Preventative health care 10/22/2011  . Cervical disc disease 10/22/2011  . Bilateral hand pain 08/05/2011  . Reflux 06/29/2011  . Bloating 06/29/2011  . Back pain 03/13/2011  . Vertigo 01/04/2011  . Nystagmus 01/04/2011  . Insomnia 10/21/2010  . CONSTIPATION 08/17/2009  . VITAMIN D DEFICIENCY 05/15/2009  . DYSPNEA 03/13/2009  . Essential hypertension, benign 01/30/2009  . DEPRESSION 01/16/2009  . Irritable bowel syndrome 01/16/2009  . OSTEOARTHRITIS 12/15/2008  . Diabetes ([place]) 11/03/2008  . Hyperlipidemia 11/03/2008  . Anxiety state 11/03/2008  . OVERACTIVE BLADDER 11/03/2008  . OSA (obstructive sleep apnea) 11/03/2008  . MURMUR 11/03/2008    Madelyn Flavors PT  01/01/2015, 12:19 PM  [facility] [address] [place], [place], [address] Phone: [phone]   Fax:  [phone]  Name: Laura [name] MRN: [id number] Date of Birth: Feb 19, 1945

## 2015-01-09 ENCOUNTER — Ambulatory Visit: Payer: Medicare Other | Admitting: Physical Therapy

## 2015-01-09 DIAGNOSIS — M47812 Spondylosis without myelopathy or radiculopathy, cervical region: Secondary | ICD-10-CM | POA: Diagnosis not present

## 2015-01-09 DIAGNOSIS — M25612 Stiffness of left shoulder, not elsewhere classified: Secondary | ICD-10-CM | POA: Diagnosis not present

## 2015-01-09 DIAGNOSIS — M25511 Pain in right shoulder: Secondary | ICD-10-CM | POA: Diagnosis not present

## 2015-01-09 DIAGNOSIS — M25512 Pain in left shoulder: Secondary | ICD-10-CM | POA: Diagnosis not present

## 2015-01-09 DIAGNOSIS — M542 Cervicalgia: Secondary | ICD-10-CM

## 2015-01-09 NOTE — Therapy (Addendum)
[facility] [place], [place], [address] Phone: [phone]   Fax:  [phone]  Physical Therapy Treatment  Patient Details  Name: Laura [name] MRN: [id number] Date of Birth: [birth year] Referring Provider: Basil Dess MD  Encounter Date: 01/09/2015    Past Medical History:  Diagnosis Date  . Anxiety   . Arthritis    back of neck, bones spurs on neck  . Cervical disc disease   . Diabetes mellitus   . Hyperlipidemia   . Hypertension   . Neuropathy ([place])   . Reflux   . Sleep apnea    wears CPAP occasionally  . Vertigo     Past Surgical History:  Procedure Laterality Date  . BLADDER SURGERY    . BREAST SURGERY    . CHOLECYSTECTOMY    . fibroids removed     breast (both breasts)  . PARTIAL HYSTERECTOMY      There were no vitals filed for this visit.  Visit Diagnosis:  Left shoulder pain  Neck pain  Shoulder stiffness, left                       [facility] Adult PT Treatment/Exercise - 01/09/15 0001    Self-Care   Self-Care Other Self-Care Comments   Other Self-Care Comments  Discussed options with patient since she has been unsuccessful finding companies to provide home traction unit and tens covered by insurance.    Acupuncturist Location L distal scaula and deltoid; R UT and deltoid   Electrical Stimulation Action premod   Electrical Stimulation Parameters 80-150 Hz to tolerance x 15 min   Electrical Stimulation Goals Pain   Traction   Type of Traction Cervical   Min (lbs) 5   Max (lbs) 10   Hold Time 99   Rest Time 5   Time 15   Manual Therapy   Manual Therapy Soft tissue mobilization   Soft tissue mobilization to L UT and deltoid; R deltoid                  PT Short Term Goals - 11/01/14 1240      PT SHORT TERM GOAL #1   Title Ind with HEP.   Time 2   Period Weeks   Status Achieved           PT Long Term Goals -  01/09/15 1222      PT LONG TERM GOAL #1   Title Perform ADL's with pain not > 3/10.   Time 6   Period Weeks   Status Achieved     PT LONG TERM GOAL #2   Title Active left shoulder flexion to 145 degrees so the patient can easily reach overhead   Time 6   Period Weeks   Status Achieved     PT LONG TERM GOAL #3   Title Active ER to 55 degrees+ to allow for easily donning/doffing of apparel   Time 6   Period Weeks   Status Not Met     PT LONG TERM GOAL #4   Title Patient to report that she has been able to return to regular light-moderate activities at the gym at least 3 days each week with no exacerbation of symptoms (modified)   Time 5   Period Weeks   Status Achieved               Problem List Patient Active Problem List  Diagnosis Date Noted  . Acute bronchitis 10/31/2015  . Urinary frequency 03/09/2015  . Dizziness and giddiness 08/09/2014  . Morbid obesity ([place]) 07/25/2014  . Orthostatic hypotension 07/07/2014  . Upper airway cough syndrome 06/26/2014  . Diarrhea 05/02/2014  . Dizziness 04/12/2014  . Neuropathy ([place])   . Rash and nonspecific skin eruption 08/30/2013  . Paresthesia of both feet 08/30/2013  . Blurred vision, bilateral 02/15/2013  . Eye pain 02/15/2013  . Sinusitis, chronic 10/07/2012  . Left shoulder pain 04/15/2012  . Encounter for preventative adult health care exam with abnormal findings 10/22/2011  . Cervical disc disease 10/22/2011  . Bilateral hand pain 08/05/2011  . Reflux 06/29/2011  . Bloating 06/29/2011  . Back pain 03/13/2011  . Vertigo 01/04/2011  . Nystagmus 01/04/2011  . Insomnia 10/21/2010  . CONSTIPATION 08/17/2009  . VITAMIN D DEFICIENCY 05/15/2009  . DYSPNEA 03/13/2009  . Essential hypertension, benign 01/30/2009  . DEPRESSION 01/16/2009  . Irritable bowel syndrome 01/16/2009  . OSTEOARTHRITIS 12/15/2008  . Diabetes ([place]) 11/03/2008  . Hyperlipidemia 11/03/2008  . Anxiety state 11/03/2008  . OVERACTIVE  BLADDER 11/03/2008  . OSA (obstructive sleep apnea) 11/03/2008  . MURMUR 11/03/2008    Madelyn Flavors PT  12/10/2015, 6:43 PM  [facility] [address] [place], [place], [address] Phone: [phone]   Fax:  [phone]  Name: Laura [name] MRN: [id number] Date of Birth: 1945-11-06  PHYSICAL THERAPY DISCHARGE SUMMARY  Visits from Start of Care: 16.  Current functional level related to goals / functional outcomes: Please see above.   Remaining deficits: All goals met.   Education / Equipment: HEP.  Plan: Patient agrees to discharge.  Patient goals were met. Patient is being discharged due to meeting the stated rehab goals.  ?????         Mali Applegate MPT

## 2015-01-10 DIAGNOSIS — G4733 Obstructive sleep apnea (adult) (pediatric): Secondary | ICD-10-CM | POA: Diagnosis not present

## 2015-01-22 DIAGNOSIS — Z23 Encounter for immunization: Secondary | ICD-10-CM | POA: Diagnosis not present

## 2015-01-22 DIAGNOSIS — M25512 Pain in left shoulder: Secondary | ICD-10-CM | POA: Diagnosis not present

## 2015-01-22 DIAGNOSIS — M4722 Other spondylosis with radiculopathy, cervical region: Secondary | ICD-10-CM | POA: Diagnosis not present

## 2015-01-30 DIAGNOSIS — G4733 Obstructive sleep apnea (adult) (pediatric): Secondary | ICD-10-CM | POA: Diagnosis not present

## 2015-02-10 DIAGNOSIS — G4733 Obstructive sleep apnea (adult) (pediatric): Secondary | ICD-10-CM | POA: Diagnosis not present

## 2015-03-08 ENCOUNTER — Ambulatory Visit: Payer: Self-pay | Admitting: Internal Medicine

## 2015-03-09 ENCOUNTER — Ambulatory Visit (INDEPENDENT_AMBULATORY_CARE_PROVIDER_SITE_OTHER): Payer: Medicare Other | Admitting: Internal Medicine

## 2015-03-09 ENCOUNTER — Other Ambulatory Visit (INDEPENDENT_AMBULATORY_CARE_PROVIDER_SITE_OTHER): Payer: Medicare Other

## 2015-03-09 ENCOUNTER — Encounter: Payer: Self-pay | Admitting: Internal Medicine

## 2015-03-09 ENCOUNTER — Other Ambulatory Visit: Payer: Self-pay | Admitting: Internal Medicine

## 2015-03-09 VITALS — BP 140/76 | HR 96 | Temp 98.9°F | Resp 20 | Wt 209.0 lb

## 2015-03-09 DIAGNOSIS — R350 Frequency of micturition: Secondary | ICD-10-CM

## 2015-03-09 DIAGNOSIS — R300 Dysuria: Secondary | ICD-10-CM | POA: Diagnosis not present

## 2015-03-09 DIAGNOSIS — E1140 Type 2 diabetes mellitus with diabetic neuropathy, unspecified: Secondary | ICD-10-CM | POA: Diagnosis not present

## 2015-03-09 DIAGNOSIS — N3281 Overactive bladder: Secondary | ICD-10-CM | POA: Insufficient documentation

## 2015-03-09 DIAGNOSIS — I10 Essential (primary) hypertension: Secondary | ICD-10-CM

## 2015-03-09 LAB — POCT URINALYSIS DIPSTICK
Bilirubin, UA: NEGATIVE
Blood, UA: NEGATIVE
GLUCOSE UA: NEGATIVE
Ketones, UA: NEGATIVE
Leukocytes, UA: NEGATIVE
NITRITE UA: NEGATIVE
Protein, UA: NEGATIVE
Spec Grav, UA: 1.015
UROBILINOGEN UA: NEGATIVE
pH, UA: 6

## 2015-03-09 LAB — URINALYSIS, ROUTINE W REFLEX MICROSCOPIC
BILIRUBIN URINE: NEGATIVE
Hgb urine dipstick: NEGATIVE
KETONES UR: NEGATIVE
Leukocytes, UA: NEGATIVE
Nitrite: NEGATIVE
Specific Gravity, Urine: <=1.005 — AB (ref 1.000–1.030)
Total Protein, Urine: NEGATIVE
URINE GLUCOSE: NEGATIVE
UROBILINOGEN UA: 0.2 (ref 0.0–1.0)
pH: 6 (ref 5.0–8.0)

## 2015-03-09 MED ORDER — CEPHALEXIN 500 MG PO CAPS: 500.0000 mg | ORAL_CAPSULE | Freq: Four times a day (QID) | ORAL | Status: DC

## 2015-03-09 NOTE — Patient Instructions (Signed)
Please take all new medication as prescribed - the antibiotic  The specimen will be sent for culture today, but results may take 2-3 days  Please continue all other medications as before, and refills have been done if requested.  Please have the pharmacy call with any other refills you may need.  Please keep your appointments with your specialists as you may have planned

## 2015-03-09 NOTE — Assessment & Plan Note (Signed)
stable overall by history and exam, recent data reviewed with pt, and pt to continue medical treatment as before,  to f/u any worsening symptoms or concerns BP Readings from Last 3 Encounters:  03/09/15 140/76  10/30/14 136/88  08/15/14 124/86

## 2015-03-09 NOTE — Assessment & Plan Note (Signed)
udip neg but cant r/o uti by hx and exam, for urine cx, emipiric antibx.  to f/u any worsening symptoms or concerns

## 2015-03-09 NOTE — Progress Notes (Signed)
Subjective:    Patient ID: Laura [name], female    DOB: [birth year], 70 y.o.   MRN: [id number]  HPI  Here to f/u; overall doing ok,  Pt denies chest pain, increasing sob or doe, wheezing, orthopnea, PND, increased LE swelling, palpitations, dizziness or syncope.  Pt denies new neurological symptoms such as new headache, or facial or extremity weakness or numbness.  Pt denies polydipsia, polyuria, or low sugar episode.   Pt denies new neurological symptoms such as new headache, or facial or extremity weakness or numbness.   Pt states overall good compliance with meds, mostly trying to follow appropriate diet, with wt overall stable,  Despite incresaed walking most days. Also with c/o Urinary freq and urgency  on the hour for 4 -5 days, but Denies urinary symptoms such as dysuria, flank pain, hematuria or n/v, fever, chills.  Could not sleep at night last night due to freq.  Has hx of OAB but not this frequent Past Medical History  Diagnosis Date  . Diabetes mellitus   . Hyperlipidemia   . Hypertension   . Anxiety   . Reflux   . Vertigo   . Arthritis     back of neck, bones spurs on neck  . Sleep apnea     wears CPAP occasionally  . Cervical disc disease   . Neuropathy [facility])    Past Surgical History  Procedure Laterality Date  . Bladder surgery    . Cholecystectomy    . Partial hysterectomy    . Fibroids removed      breast (both breasts)  . Breast surgery      reports that she has never smoked. She has never used smokeless tobacco. She reports that she does not drink alcohol or use illicit drugs. family history includes Diabetes in her father, maternal aunt, and sister; Hypertension in her other. There is no history of Colon cancer, Esophageal cancer, Stomach cancer, or Rectal cancer. Allergies  Allergen Reactions  . Sulfa Antibiotics     Tongue swells, hives, itching  . Codeine     itch  . Crestor [Rosuvastatin Calcium] Other (See Comments)    Did something to memory   .  Hydrocodone-Homatropine Other (See Comments)    Vertigo *pt strongly prefers to never take*  . Lipitor [Atorvastatin Calcium] Other (See Comments)    Makes weak   . Prednisone Other (See Comments)    Nervous *pt strongly prefers to never be given prednisone*    Current Outpatient Prescriptions on File Prior to Visit  Medication Sig Dispense Refill  . acetaminophen (TYLENOL) 325 MG tablet Take 325 mg by mouth as needed for mild pain, moderate pain, fever or headache.     Marland Kitchen amLODipine (NORVASC) 5 MG tablet Take 1 tablet (5 mg total) by mouth daily. 90 tablet 3  . clonazePAM (KLONOPIN) 0.5 MG tablet Take 0.5 mg by mouth at bedtime as needed for anxiety (sleep).     . famotidine (PEPCID) 20 MG tablet Take 20 mg by mouth daily as needed for heartburn or indigestion.     Marland Kitchen glimepiride (AMARYL) 1 MG tablet Take 0.5 tablets (0.5 mg total) by mouth 2 (two) times daily. 90 tablet 3  . glimepiride (AMARYL) 1 MG tablet TAKE ONE-HALF TABLET BY MOUTH TWO TIMES A DAY 90 tablet 1  . glucose blood test strip 1 each by Other route daily. Use to check blood sugars daily Dx E11.9 50 each 11  . Lancets (ONETOUCH ULTRASOFT) lancets USE FOR  CHECKING BLOOD SUGARS TWICE DAILY 100 each 3  . loperamide (IMODIUM A-D) 2 MG tablet Take 2 mg by mouth daily as needed for diarrhea or loose stools.     Marland Kitchen LORazepam (ATIVAN) 0.5 MG tablet Take 1 tablet (0.5 mg total) by mouth daily as needed for anxiety. 30 tablet 2  . meclizine (ANTIVERT) 12.5 MG tablet Take 1 tablet (12.5 mg total) by mouth 3 (three) times daily as needed for dizziness. 30 tablet 1  . omeprazole (PRILOSEC) 20 MG capsule TAKE ONE CAPSULE BY MOUTH DAILY 90 capsule 1  . OVER THE COUNTER MEDICATION Apply 1 application topically daily as needed (pain).    . psyllium (METAMUCIL) 58.6 % powder Take 1 packet by mouth daily.    . traMADol (ULTRAM) 50 MG tablet Take 1 tablet (50 mg total) by mouth every 6 (six) hours as needed. 60 tablet 0  . valsartan (DIOVAN) 160  MG tablet Take 1 tablet (160 mg total) by mouth daily. 90 tablet 3   No current facility-administered medications on file prior to visit.   Review of Systems  Constitutional: Negative for unusual diaphoresis or night sweats HENT: Negative for ringing in ear or discharge Eyes: Negative for double vision or worsening visual disturbance.  Respiratory: Negative for choking and stridor.   Gastrointestinal: Negative for vomiting or other signifcant bowel change Genitourinary: Negative for hematuria or change in urine volume.  Musculoskeletal: Negative for other MSK pain or swelling Skin: Negative for color change and worsening wound.  Neurological: Negative for tremors and numbness other than noted  Psychiatric/Behavioral: Negative for decreased concentration or agitation other than above       Objective:   Physical Exam BP 140/76 mmHg  Pulse 96  Temp(Src) 98.9 F (37.2 C) (Oral)  Resp 20  Wt 209 lb (94.802 kg)  SpO2 97% VS noted, non toxic Constitutional: Pt appears in no significant distress HENT: Head: NCAT.  Right Ear: External ear normal.  Left Ear: External ear normal.  Eyes: . Pupils are equal, round, and reactive to light. Conjunctivae and EOM are normal Neck: Normal range of motion. Neck supple.  Cardiovascular: Normal rate and regular rhythm.   Pulmonary/Chest: Effort normal and breath sounds without rales or wheezing.  Abd:  Soft, mild tender low mid abd, ND, + BS, no guarding or rebound, no flank tender Neurological: Pt is alert. Not confused , motor grossly intact Skin: Skin is warm. No rash, no LE edema Psychiatric: Pt behavior is normal. No agitation.    POCT Urinalysis Dipstick  Status: Finalresult Visible to patient:  Not Released Dx:  Dysuria   Normal         Newer results are available. Click to view them now.          Ref Range 10:53 AM  21mo ago  70mo ago  22yr ago     Color, UA  yellow   yellow    Clarity, UA  cloudy   clear     Glucose, UA  negative NEGATIVE  negative    Bilirubin, UA  negative   negative    Ketones, UA  negative   negative    Spec Grav, UA  1.015   1.015    Blood, UA  negative   negative    pH, UA  6.0   5.0    Protein, UA  negative   negative    Urobilinogen, UA  negative 0.2 0.2 negative    Nitrite, UA  negatve  negative    Leukocytes, UA Negative  Negative NEGATIVER, CM TRACE (A) NegativeR   Resulting Agency                  Assessment & Plan:

## 2015-03-09 NOTE — Assessment & Plan Note (Signed)
stable overall by history and exam, recent data reviewed with pt, and pt to continue medical treatment as before,  to f/u any worsening symptoms or concerns Lab Results  Component Value Date   HGBA1C 7.1* 04/12/2014    

## 2015-03-09 NOTE — Progress Notes (Signed)
Pre visit review using our clinic review tool, if applicable. No additional management support is needed unless otherwise documented below in the visit note. 

## 2015-03-11 LAB — URINE CULTURE

## 2015-03-13 DIAGNOSIS — G4733 Obstructive sleep apnea (adult) (pediatric): Secondary | ICD-10-CM | POA: Diagnosis not present

## 2015-03-22 ENCOUNTER — Telehealth: Payer: Self-pay | Admitting: Internal Medicine

## 2015-03-22 NOTE — Telephone Encounter (Signed)
Patient was in on 2/24 and was prescribed cephALEXin (KEFLEX) 500 MG capsule [id number]  She had bad diarrhea due to the prescription so she stopped taking it.   She states she is having some lower abdominal pain and is wondering if she needs a different antibiotic  She would like a phone call please

## 2015-03-22 NOTE — Telephone Encounter (Signed)
Pt informed

## 2015-03-22 NOTE — Telephone Encounter (Signed)
Ok to simply stop the antibiotic for now  [facility] for OTC probiotic such as Align until improved  No need further antibx as urine cx proved negative

## 2015-03-22 NOTE — Telephone Encounter (Signed)
Please advise on what I need to let the patient know

## 2015-04-06 ENCOUNTER — Telehealth: Payer: Self-pay

## 2015-04-06 MED ORDER — ONETOUCH ULTRA 2 W/DEVICE KIT: PACK | Status: AC

## 2015-04-06 NOTE — Telephone Encounter (Signed)
I have sent rx for one touch glucometer  Pt should have pharmacy call us with alternative if this is not covered by her insurance

## 2015-04-06 NOTE — Addendum Note (Signed)
Addended by: Biagio Borg on: 04/06/2015 12:24 PM   Modules accepted: Orders

## 2015-04-06 NOTE — Telephone Encounter (Signed)
Please advise, patient is requesting a new glucose monitor.

## 2015-04-10 DIAGNOSIS — G4733 Obstructive sleep apnea (adult) (pediatric): Secondary | ICD-10-CM | POA: Diagnosis not present

## 2015-04-18 ENCOUNTER — Encounter (HOSPITAL_COMMUNITY): Payer: Self-pay

## 2015-04-19 ENCOUNTER — Other Ambulatory Visit: Payer: Self-pay | Admitting: Internal Medicine

## 2015-04-28 ENCOUNTER — Ambulatory Visit: Payer: Medicare Other | Admitting: Family Medicine

## 2015-05-11 DIAGNOSIS — G4733 Obstructive sleep apnea (adult) (pediatric): Secondary | ICD-10-CM | POA: Diagnosis not present

## 2015-05-16 DIAGNOSIS — G4733 Obstructive sleep apnea (adult) (pediatric): Secondary | ICD-10-CM | POA: Diagnosis not present

## 2015-06-10 DIAGNOSIS — G4733 Obstructive sleep apnea (adult) (pediatric): Secondary | ICD-10-CM | POA: Diagnosis not present

## 2015-06-18 ENCOUNTER — Telehealth: Payer: Self-pay | Admitting: Internal Medicine

## 2015-06-18 NOTE — Telephone Encounter (Signed)
[place] [facility] - Client [place]  Patient Name: Laura [name]  DOB: 04/18/1945    Initial Comment Caller states she is diabetic with hypertension, having dizziness and blurred vision,    Nurse Assessment  Nurse: Wynetta Emery, RN, Baker Janus Date/Time (Eastern Time): 06/18/2015 10:52:16 AM  Confirm and document reason for call. If symptomatic, describe symptoms. You must click the next button to save text entered. ---[name] having blurred vision and dizziness that has gotten worse over the last two weeks getting concerned -- blood sugar was 155 and 200 but does not check it regularly-- everything is moving while she is moving  Has the patient traveled out of the country within the last 30 days? ---No  Does the patient have any new or worsening symptoms? ---Yes  Will a triage be completed? ---Yes  Related visit to physician within the last 2 weeks? ---No  Does the PT have any chronic conditions? (i.e. diabetes, asthma, etc.) ---Yes  List chronic conditions. ---HTN Diabetes  Is this a behavioral health or substance abuse call? ---No     Guidelines    Guideline Title Affirmed Question Affirmed Notes  Dizziness - Lightheadedness [1] MODERATE dizziness (e.g., interferes with normal activities) AND [2] has NOT been evaluated by physician for this (Exception: dizziness caused by heat exposure, sudden standing, or poor fluid intake)    Final Disposition User   See Physician within 24 Hours Wynetta Emery, Therapist, sports, Baker Janus    Comments  NOTE;; SYSTEM CLOSED DOWN ON THIS NURSE HAD TO REBOOT AND REDO RECORD SO IF DUPLICATE IS PRESENT THAT IS WHY SORRY NO AVAILABLE APPTS WITH Dr. Jenny Reichmann given an appt. with Dr. Alain Marion 06-20-2015 1115am at [facility] office. c/o dizziness/blurred vision thinks it is assoicated with amolodipine   Referrals  [facility]   Disagree/Comply: Comply

## 2015-06-19 ENCOUNTER — Ambulatory Visit: Payer: Self-pay | Admitting: Internal Medicine

## 2015-06-19 ENCOUNTER — Other Ambulatory Visit: Payer: Self-pay | Admitting: Internal Medicine

## 2015-06-20 ENCOUNTER — Other Ambulatory Visit (INDEPENDENT_AMBULATORY_CARE_PROVIDER_SITE_OTHER): Payer: Medicare Other

## 2015-06-20 ENCOUNTER — Ambulatory Visit (INDEPENDENT_AMBULATORY_CARE_PROVIDER_SITE_OTHER): Payer: Medicare Other | Admitting: Internal Medicine

## 2015-06-20 ENCOUNTER — Encounter: Payer: Self-pay | Admitting: Internal Medicine

## 2015-06-20 VITALS — BP 160/80 | HR 94 | Wt 207.0 lb

## 2015-06-20 DIAGNOSIS — I10 Essential (primary) hypertension: Secondary | ICD-10-CM

## 2015-06-20 DIAGNOSIS — H538 Other visual disturbances: Secondary | ICD-10-CM

## 2015-06-20 DIAGNOSIS — E1140 Type 2 diabetes mellitus with diabetic neuropathy, unspecified: Secondary | ICD-10-CM

## 2015-06-20 DIAGNOSIS — R42 Dizziness and giddiness: Secondary | ICD-10-CM

## 2015-06-20 LAB — CBC WITH DIFFERENTIAL/PLATELET
BASOS ABS: 0 10*3/uL (ref 0.0–0.1)
Basophils Relative: 0.4 % (ref 0.0–3.0)
EOS ABS: 0.1 10*3/uL (ref 0.0–0.7)
Eosinophils Relative: 1.3 % (ref 0.0–5.0)
HCT: 39.3 % (ref 36.0–46.0)
Hemoglobin: 13.3 g/dL (ref 12.0–15.0)
LYMPHS ABS: 2.4 10*3/uL (ref 0.7–4.0)
Lymphocytes Relative: 29.1 % (ref 12.0–46.0)
MCHC: 33.8 g/dL (ref 30.0–36.0)
MCV: 84.4 fl (ref 78.0–100.0)
MONO ABS: 0.4 10*3/uL (ref 0.1–1.0)
MONOS PCT: 4.2 % (ref 3.0–12.0)
NEUTROS ABS: 5.5 10*3/uL (ref 1.4–7.7)
NEUTROS PCT: 65 % (ref 43.0–77.0)
PLATELETS: 273 10*3/uL (ref 150.0–400.0)
RBC: 4.66 Mil/uL (ref 3.87–5.11)
RDW: 14.2 % (ref 11.5–15.5)
WBC: 8.4 10*3/uL (ref 4.0–10.5)

## 2015-06-20 LAB — BASIC METABOLIC PANEL
BUN: 14 mg/dL (ref 6–23)
CALCIUM: 9.8 mg/dL (ref 8.4–10.5)
CO2: 26 meq/L (ref 19–32)
CREATININE: 0.79 mg/dL (ref 0.40–1.20)
Chloride: 105 mEq/L (ref 96–112)
GFR: 92.37 mL/min (ref >60.00)
GLUCOSE: 230 mg/dL — AB (ref 70–99)
Potassium: 4.3 mEq/L (ref 3.5–5.1)
SODIUM: 139 meq/L (ref 135–145)

## 2015-06-20 LAB — HEPATIC FUNCTION PANEL
ALK PHOS: 78 U/L (ref 39–117)
ALT: 19 U/L (ref 0–35)
AST: 16 U/L (ref 0–37)
Albumin: 4.4 g/dL (ref 3.5–5.2)
BILIRUBIN DIRECT: 0.1 mg/dL (ref 0.0–0.3)
BILIRUBIN TOTAL: 0.4 mg/dL (ref 0.2–1.2)
Total Protein: 7.5 g/dL (ref 6.0–8.3)

## 2015-06-20 LAB — TSH: TSH: 1.87 u[IU]/mL (ref 0.35–4.50)

## 2015-06-20 LAB — HEMOGLOBIN A1C: HEMOGLOBIN A1C: 7.5 % — AB (ref 4.6–6.5)

## 2015-06-20 LAB — GLUCOSE, POCT (MANUAL RESULT ENTRY): POC Glucose: 297 mg/dl — AB (ref 70–99)

## 2015-06-20 MED ORDER — GLIMEPIRIDE 2 MG PO TABS: 2.0000 mg | ORAL_TABLET | Freq: Every day | ORAL | Status: DC

## 2015-06-20 MED ORDER — HYDROCHLOROTHIAZIDE 12.5 MG PO CAPS: 12.5000 mg | ORAL_CAPSULE | Freq: Every day | ORAL | Status: DC

## 2015-06-20 MED ORDER — VALSARTAN 160 MG PO TABS: 160.0000 mg | ORAL_TABLET | Freq: Every day | ORAL | Status: DC

## 2015-06-20 NOTE — Assessment & Plan Note (Signed)
Improve DM control Amaryl 2 mg/d

## 2015-06-20 NOTE — Progress Notes (Signed)
Pre visit review using our clinic review tool, if applicable. No additional management support is needed unless otherwise documented below in the visit note. 

## 2015-06-20 NOTE — Assessment & Plan Note (Signed)
Pt stopped Amlodipine due to side effects Cont Diovan Added HCTZ Labs

## 2015-06-20 NOTE — Assessment & Plan Note (Signed)
C/o blurred vision and dizziness/off balance. No spinning sensation. Last episode in Dec 2012 and June 2016 - she had to have a vestib rehab at [facility] for months. Brain MRI was nl in 7/16 nl Recurrent vertigo Improve CBG and BP control Meclizine

## 2015-06-20 NOTE — Progress Notes (Signed)
Subjective:  Patient ID: Laura [name], female    DOB: [birth year]  Age: 70 y.o. MRN: [id number]  CC: Dizziness   HPI BRYANDA MIKEL presents for being sick w/a virus since 3 weeks ago. C/o blurred vision and dizziness/off balance. No spinning sensation. Last episode in Dec 2012 and June 2016 - she had to have a vestib rehab at [facility] for months. Brain MRI was nl in 7/16 nl Pt stopped Amlodipine due to side effects CBG was 170  Outpatient Prescriptions Prior to Visit  Medication Sig Dispense Refill  . acetaminophen (TYLENOL) 325 MG tablet Take 325 mg by mouth as needed for mild pain, moderate pain, fever or headache.     . Blood Glucose Monitoring Suppl (ONE TOUCH ULTRA 2) w/Device KIT Use as directed 1 each 0  . clonazePAM (KLONOPIN) 0.5 MG tablet Take 0.5 mg by mouth at bedtime as needed for anxiety (sleep).     . famotidine (PEPCID) 20 MG tablet Take 20 mg by mouth daily as needed for heartburn or indigestion.     Marland Kitchen glimepiride (AMARYL) 1 MG tablet TAKE 1/2 TABLET BY MOUTH 2 TIMES A DAY 90 tablet 0  . Lancets (ONETOUCH ULTRASOFT) lancets USE FOR CHECKING BLOOD SUGARS TWICE DAILY 100 each 3  . loperamide (IMODIUM A-D) 2 MG tablet Take 2 mg by mouth daily as needed for diarrhea or loose stools.     Marland Kitchen LORazepam (ATIVAN) 0.5 MG tablet TAKE ONE (1) TABLET BY MOUTH EVERY DAY AS NEEDED FOR ANXIETY 30 tablet 0  . meclizine (ANTIVERT) 12.5 MG tablet Take 1 tablet (12.5 mg total) by mouth 3 (three) times daily as needed for dizziness. 30 tablet 1  . omeprazole (PRILOSEC) 20 MG capsule TAKE ONE CAPSULE BY MOUTH DAILY 90 capsule 1  . ONE TOUCH ULTRA TEST test strip USE TO CHECK BLOOD SUGARS DAILY 50 each 0  . OVER THE COUNTER MEDICATION Apply 1 application topically daily as needed (pain).    . psyllium (METAMUCIL) 58.6 % powder Take 1 packet by mouth daily.    . valsartan (DIOVAN) 160 MG tablet Take 1 tablet (160 mg total) by mouth daily. 90 tablet 3  . cephALEXin (KEFLEX) 500 MG capsule Take 1  capsule (500 mg total) by mouth 4 (four) times daily. 40 capsule 0  . amLODipine (NORVASC) 5 MG tablet Take 1 tablet (5 mg total) by mouth daily. (Patient not taking: Reported on 06/20/2015) 90 tablet 3  . traMADol (ULTRAM) 50 MG tablet Take 1 tablet (50 mg total) by mouth every 6 (six) hours as needed. (Patient not taking: Reported on 06/20/2015) 60 tablet 0  . glimepiride (AMARYL) 1 MG tablet Take 0.5 tablets (0.5 mg total) by mouth 2 (two) times daily. (Patient not taking: Reported on 06/20/2015) 90 tablet 3   No facility-administered medications prior to visit.    ROS Review of Systems  Constitutional: Negative for chills, activity change, appetite change, fatigue and unexpected weight change.  HENT: Negative for congestion, mouth sores and sinus pressure.   Eyes: Negative for visual disturbance.  Respiratory: Negative for cough and chest tightness.   Gastrointestinal: Negative for nausea and abdominal pain.  Genitourinary: Negative for frequency, difficulty urinating and vaginal pain.  Musculoskeletal: Negative for back pain and gait problem.  Skin: Negative for pallor and rash.  Neurological: Positive for dizziness and light-headedness. Negative for tremors, syncope, weakness, numbness and headaches.  Psychiatric/Behavioral: Negative for confusion and sleep disturbance.    Objective:  BP 160/80 mmHg  Pulse 94  Wt 207 lb (93.895 kg)  SpO2 97%  BP Readings from Last 3 Encounters:  06/20/15 160/80  03/09/15 140/76  10/30/14 136/88    Wt Readings from Last 3 Encounters:  06/20/15 207 lb (93.895 kg)  03/09/15 209 lb (94.802 kg)  10/30/14 211 lb 6.4 oz (95.89 kg)    Physical Exam  Constitutional: She appears well-developed. No distress.  HENT:  Head: Normocephalic.  Right Ear: External ear normal.  Left Ear: External ear normal.  Nose: Nose normal.  Mouth/Throat: Oropharynx is clear and moist.  Eyes: Conjunctivae are normal. Pupils are equal, round, and reactive to light.  Right eye exhibits no discharge. Left eye exhibits no discharge.  Neck: Normal range of motion. Neck supple. No JVD present. No tracheal deviation present. No thyromegaly present.  Cardiovascular: Normal rate, regular rhythm and normal heart sounds.   Pulmonary/Chest: No stridor. No respiratory distress. She has no wheezes.  Abdominal: Soft. Bowel sounds are normal. She exhibits no distension and no mass. There is no tenderness. There is no rebound and no guarding.  Musculoskeletal: She exhibits no edema or tenderness.  Lymphadenopathy:    She has no cervical adenopathy.  Neurological: She displays normal reflexes. No cranial nerve deficit. She exhibits normal muscle tone. Coordination abnormal.  Skin: No rash noted. No erythema.  Psychiatric: She has a normal mood and affect. Her behavior is normal. Judgment and thought content normal.  ataxic Romberg (+/-) H-P (-) B  Lab Results  Component Value Date   WBC 9.8 08/11/2014   HGB 13.2 08/11/2014   HCT 38.5 08/11/2014   PLT 260 08/11/2014   GLUCOSE 130* 08/11/2014   CHOL 258* 04/12/2014   TRIG 196.0* 04/12/2014   HDL 53.20 04/12/2014   LDLDIRECT 168.3 02/17/2013   LDLCALC 166* 04/12/2014   ALT 15 04/12/2014   AST 15 04/12/2014   NA 140 08/11/2014   K 4.1 08/11/2014   CL 108 08/11/2014   CREATININE 0.69 08/11/2014   BUN 13 08/11/2014   CO2 24 08/11/2014   TSH 2.34 04/12/2014   HGBA1C 7.1* 04/12/2014   MICROALBUR 0.9 04/12/2014    Mr Brain Wo Contrast  08/11/2014  CLINICAL DATA:  Ataxia. Dizziness and unsteady gait beginning 3 days ago. EXAM: MRI HEAD WITHOUT CONTRAST TECHNIQUE: Multiplanar, multiecho pulse sequences of the brain and surrounding structures were obtained without intravenous contrast. COMPARISON:  01/04/2011 FINDINGS: There is no evidence of acute infarct, intracranial hemorrhage, mass, midline shift, or extra-axial fluid collection. Ventricles and sulci are within normal limits for age. Small foci of T2  hyperintensity in the cerebral white matter bilaterally, most notably in the left periatrial white matter, are similar to the prior study and nonspecific but compatible with minimal chronic small vessel ischemic disease. Orbits are unremarkable. No significant inflammatory disease is seen in the paranasal sinuses or mastoid air cells. Major intracranial vascular flow voids are preserved. IMPRESSION: 1. No acute intracranial abnormality. 2. Minimal chronic small vessel ischemic disease. Electronically Signed   By: Logan Bores   On: 08/11/2014 16:21    Assessment & Plan:   There are no diagnoses linked to this encounter. I have discontinued Ms. Sipos's cephALEXin. I am also having her maintain her loperamide, onetouch ultrasoft, clonazePAM, acetaminophen, amLODipine, famotidine, meclizine, valsartan, psyllium, OVER THE COUNTER MEDICATION, omeprazole, traMADol, ONE TOUCH ULTRA 2, glimepiride, ONE TOUCH ULTRA TEST, and LORazepam.  No orders of the defined types were placed in this encounter.     Follow-up: No Follow-up on file.  Walker Kehr, MD

## 2015-06-20 NOTE — Assessment & Plan Note (Signed)
Worse Increase Amaryl to 2 mg qam Labs

## 2015-06-20 NOTE — Addendum Note (Signed)
Addended by: Cresenciano Lick on: 06/20/2015 04:27 PM   Modules accepted: Orders

## 2015-06-21 ENCOUNTER — Telehealth: Payer: Self-pay | Admitting: *Deleted

## 2015-06-21 NOTE — Telephone Encounter (Signed)
HCTZ does not have sulfa in it. The chance of allergy is <5%. It is ok to take Thx

## 2015-06-21 NOTE — Telephone Encounter (Signed)
Left smg on triage stating saw Dr. Camila Li yesterday he rx HCTZ. Pt states she is allergic to sulfa & this medication contain sulfa, and she is afraid to take...Laura [name]

## 2015-06-21 NOTE — Telephone Encounter (Signed)
Notified pt w/MD response. Pt states on the pamphlet from pharmacy  it states do not take if you are allergic to Sulfonamides which contain sulfa. She is scare to take she states she has a severe reaction to any sulfa drugs hives, swollen tongue/throat. Requesting another med...Johny Chess

## 2015-06-21 NOTE — Telephone Encounter (Signed)
Noted. Please do not take HCTZ and f/u w/Dr Jenny Reichmann next week Thx

## 2015-06-22 NOTE — Telephone Encounter (Signed)
Notified pt w/MD response.../lmb 

## 2015-06-25 ENCOUNTER — Other Ambulatory Visit: Payer: Self-pay | Admitting: *Deleted

## 2015-06-25 NOTE — Telephone Encounter (Signed)
Rec'd fax pt requesting refill on her Lorazepam. Per chart med was approved on 06/19/15. [place] spoke w/Ethan verified if rx was received on 6/6. Per Thomasena Edis they never received gave md authorization from 6/6 on the Lorazepam.../lmb

## 2015-06-29 ENCOUNTER — Encounter: Payer: Self-pay | Admitting: Internal Medicine

## 2015-06-29 ENCOUNTER — Ambulatory Visit (INDEPENDENT_AMBULATORY_CARE_PROVIDER_SITE_OTHER): Payer: Medicare Other | Admitting: Internal Medicine

## 2015-06-29 VITALS — BP 140/80 | HR 85 | Temp 98.4°F | Resp 20 | Wt 206.0 lb

## 2015-06-29 DIAGNOSIS — M8580 Other specified disorders of bone density and structure, unspecified site: Secondary | ICD-10-CM | POA: Diagnosis not present

## 2015-06-29 DIAGNOSIS — Z23 Encounter for immunization: Secondary | ICD-10-CM

## 2015-06-29 DIAGNOSIS — I10 Essential (primary) hypertension: Secondary | ICD-10-CM

## 2015-06-29 DIAGNOSIS — Z0000 Encounter for general adult medical examination without abnormal findings: Secondary | ICD-10-CM | POA: Diagnosis not present

## 2015-06-29 DIAGNOSIS — E785 Hyperlipidemia, unspecified: Secondary | ICD-10-CM

## 2015-06-29 DIAGNOSIS — E1140 Type 2 diabetes mellitus with diabetic neuropathy, unspecified: Secondary | ICD-10-CM | POA: Diagnosis not present

## 2015-06-29 DIAGNOSIS — Z1159 Encounter for screening for other viral diseases: Secondary | ICD-10-CM

## 2015-06-29 DIAGNOSIS — F411 Generalized anxiety disorder: Secondary | ICD-10-CM

## 2015-06-29 DIAGNOSIS — R6889 Other general symptoms and signs: Secondary | ICD-10-CM

## 2015-06-29 DIAGNOSIS — Z0001 Encounter for general adult medical examination with abnormal findings: Secondary | ICD-10-CM

## 2015-06-29 MED ORDER — LORAZEPAM 0.5 MG PO TABS: ORAL_TABLET | ORAL | Status: DC

## 2015-06-29 NOTE — Assessment & Plan Note (Signed)
stable overall by history and exam, and pt to continue medical treatment as before,  to f/u any worsening symptoms or concerns, ok to cont ativan prn, d/c the klonopin .

## 2015-06-29 NOTE — Assessment & Plan Note (Signed)
Lab Results  Component Value Date   LDLCALC 166* 04/12/2014   Goal ldl < 70, has been statin intolerant, to cont lower chol diet

## 2015-06-29 NOTE — Assessment & Plan Note (Addendum)
Mild uncontrolled, improved with increased OHA, stable overall by history and exam, recent data reviewed with pt, and pt to continue medical treatment as before,  to f/u any worsening symptoms or concerns Lab Results  Component Value Date   HGBA1C 7.5* 06/20/2015   Cont diet  In addition to the time spent performing CPE, I spent an additional 25 minutes face to face,in which greater than 50% of this time was spent in counseling and coordination of care for patient's acute illness as documented.

## 2015-06-29 NOTE — Patient Instructions (Addendum)
You had the Tdap (tetanus) shot today  Please schedule the bone density test before leaving today at the scheduling desk (where you check out)  You will be contacted regarding the referral for: Nutrition  Please continue all other medications as before, and refills have been done if requested - the lorazepam (and stay off the klonopin)  Please have the pharmacy call with any other refills you may need.  Please continue your efforts at being more active, low cholesterol diet, and weight control.  You are otherwise up to date with prevention measures today.  Please keep your appointments with your specialists as you may have planned  Please return in 6 months, or sooner if needed, with Lab testing done 3-5 days before

## 2015-06-29 NOTE — Assessment & Plan Note (Signed)

## 2015-06-29 NOTE — Progress Notes (Signed)
Pre visit review using our clinic review tool, if applicable. No additional management support is needed unless otherwise documented below in the visit note. 

## 2015-06-29 NOTE — Progress Notes (Signed)
Subjective:    Patient ID: Laura [name], female    DOB: Mar 19, 1945, 70 y.o.   MRN: [id number]  HPI  Here for wellness and f/u;  Overall doing ok;  Pt denies Chest pain, worsening SOB, DOE, wheezing, orthopnea, PND, worsening LE edema, palpitations, dizziness or syncope.  Pt denies neurological change such as new headache, facial or extremity weakness. . Pt states overall good compliance with treatment and medications, good tolerability, and has been trying to follow appropriate diet.  Pt denies worsening depressive symptoms, suicidal ideation or panic.  No longer taking the klonopin, doing ok with the lorazepam.  No fever, night sweats, wt loss, loss of appetite, or other constitutional symptoms.  Pt states good ability with ADL's, has low fall risk, home safety reviewed and adequate, no other significant changes in hearing or vision.    Did see Dr Alain Marion last wk with dizziness and elevated BS, sugar close to 300 at the time, plans to see optho soon.  Had recent increased glimeparide. Asks to see nutritionist.  Pt denies polydipsia, polyuria, or low sugar symptoms such as weakness or confusion improved with po intake.  Pt states overall good compliance with meds, trying to follow lower cholesterol, diabetic diet, wt overall stable but little exercise however.     Had also stopped her amlodipine x 3 days prior to visit as she thought caused fatigue, but actually did better after amaryl increased from 1 to 2 mg, so now takes her amlodipine again every day. Still not taking the hCT as she was concerned about cross reaction to sulfa that she read about, but willing to restart if ok after discussion today.   Past Medical History  Diagnosis Date  . Diabetes mellitus   . Hyperlipidemia   . Hypertension   . Anxiety   . Reflux   . Vertigo   . Arthritis     back of neck, bones spurs on neck  . Sleep apnea     wears CPAP occasionally  . Cervical disc disease   . Neuropathy [facility])    Past Surgical  History  Procedure Laterality Date  . Bladder surgery    . Cholecystectomy    . Partial hysterectomy    . Fibroids removed      breast (both breasts)  . Breast surgery      reports that she has never smoked. She has never used smokeless tobacco. She reports that she does not drink alcohol or use illicit drugs. family history includes Diabetes in her father, maternal aunt, and sister; Hypertension in her other. There is no history of Colon cancer, Esophageal cancer, Stomach cancer, or Rectal cancer. Allergies  Allergen Reactions  . Sulfa Antibiotics     Tongue swells, hives, itching  . Codeine     itch  . Crestor [Rosuvastatin Calcium] Other (See Comments)    Did something to memory   . Hydrocodone-Homatropine Other (See Comments)    Vertigo *pt strongly prefers to never take*  . Keflex [Cephalexin] Diarrhea and Nausea And Vomiting  . Lipitor [Atorvastatin Calcium] Other (See Comments)    Makes weak   . Prednisone Other (See Comments)    Nervous *pt strongly prefers to never be given prednisone*    Current Outpatient Prescriptions on File Prior to Visit  Medication Sig Dispense Refill  . acetaminophen (TYLENOL) 325 MG tablet Take 325 mg by mouth as needed for mild pain, moderate pain, fever or headache.     . Blood Glucose Monitoring Suppl (  ONE TOUCH ULTRA 2) w/Device KIT Use as directed 1 each 0  . glimepiride (AMARYL) 2 MG tablet Take 1 tablet (2 mg total) by mouth daily before breakfast. 90 tablet 3  . Lancets (ONETOUCH ULTRASOFT) lancets USE FOR CHECKING BLOOD SUGARS TWICE DAILY 100 each 3  . loperamide (IMODIUM A-D) 2 MG tablet Take 2 mg by mouth daily as needed for diarrhea or loose stools.     . meclizine (ANTIVERT) 12.5 MG tablet Take 1 tablet (12.5 mg total) by mouth 3 (three) times daily as needed for dizziness. 30 tablet 1  . omeprazole (PRILOSEC) 20 MG capsule TAKE ONE CAPSULE BY MOUTH DAILY 90 capsule 1  . ONE TOUCH ULTRA TEST test strip USE TO CHECK BLOOD SUGARS  DAILY 50 each 0  . OVER THE COUNTER MEDICATION Apply 1 application topically daily as needed (pain).    . valsartan (DIOVAN) 160 MG tablet Take 1 tablet (160 mg total) by mouth daily. 90 tablet 3   No current facility-administered medications on file prior to visit.   Review of Systems Constitutional: Negative for increased diaphoresis, or other activity, appetite or siginficant weight change other than noted HENT: Negative for worsening hearing loss, ear pain, facial swelling, mouth sores and neck stiffness.   Eyes: Negative for other worsening pain, redness or visual disturbance.  Respiratory: Negative for choking or stridor Cardiovascular: Negative for other chest pain and palpitations.  Gastrointestinal: Negative for worsening diarrhea, blood in stool, or abdominal distention Genitourinary: Negative for hematuria, flank pain or change in urine volume.  Musculoskeletal: Negative for myalgias or other joint complaints.  Skin: Negative for other color change and wound or drainage.  Neurological: Negative for syncope and numbness. other than noted Hematological: Negative for adenopathy. or other swelling Psychiatric/Behavioral: Negative for hallucinations, SI, self-injury, decreased concentration or other worsening agitation.      Objective:   Physical Exam BP 140/80 mmHg  Pulse 85  Temp(Src) 98.4 F (36.9 C) (Oral)  Resp 20  Wt 206 lb (93.441 kg)  SpO2 96% VS noted,  Constitutional: Pt is oriented to person, place, and time. Appears well-developed and well-nourished, in no significant distress Head: Normocephalic and atraumatic  Eyes: Conjunctivae and EOM are normal. Pupils are equal, round, and reactive to light Right Ear: External ear normal.  Left Ear: External ear normal Nose: Nose normal.  Mouth/Throat: Oropharynx is clear and moist  Neck: Normal range of motion. Neck supple. No JVD present. No tracheal deviation present or significant neck LA or mass Cardiovascular:  Normal rate, regular rhythm, normal heart sounds and intact distal pulses.   Pulmonary/Chest: Effort normal and breath sounds without rales or wheezing  Abdominal: Soft. Bowel sounds are normal. NT. No HSM  Musculoskeletal: Normal range of motion. Exhibits no edema Lymphadenopathy: Has no cervical adenopathy.  Neurological: Pt is alert and oriented to person, place, and time. Pt has normal reflexes. No cranial nerve deficit. Motor grossly intact Skin: Skin is warm and dry. No rash noted or new ulcers Psychiatric:  Has normal mood and affect. Behavior is normal.     Assessment & Plan:

## 2015-06-29 NOTE — Assessment & Plan Note (Signed)
stable overall by history and exam, recent data reviewed with pt, and pt to continue medical treatment as before,  to f/u any worsening symptoms or concerns, ok to take all meds as prescribed BP Readings from Last 3 Encounters:  06/29/15 140/80  06/20/15 160/80  03/09/15 140/76

## 2015-07-06 ENCOUNTER — Ambulatory Visit (INDEPENDENT_AMBULATORY_CARE_PROVIDER_SITE_OTHER)
Admission: RE | Admit: 2015-07-06 | Discharge: 2015-07-06 | Disposition: A | Discharge status: Home / Self Care | Payer: Medicare Other | Source: Ambulatory Visit | Attending: Internal Medicine | Admitting: Internal Medicine

## 2015-07-06 DIAGNOSIS — M8580 Other specified disorders of bone density and structure, unspecified site: Secondary | ICD-10-CM

## 2015-07-06 DIAGNOSIS — M8588 Other specified disorders of bone density and structure, other site: Secondary | ICD-10-CM

## 2015-07-23 ENCOUNTER — Encounter: Payer: Self-pay | Admitting: Endocrinology

## 2015-07-25 ENCOUNTER — Other Ambulatory Visit: Payer: Self-pay | Admitting: Internal Medicine

## 2015-07-26 ENCOUNTER — Ambulatory Visit (INDEPENDENT_AMBULATORY_CARE_PROVIDER_SITE_OTHER): Payer: Medicare Other

## 2015-07-26 VITALS — BP 142/70 | Ht 64.0 in | Wt 207.5 lb

## 2015-07-26 DIAGNOSIS — Z0000 Encounter for general adult medical examination without abnormal findings: Secondary | ICD-10-CM

## 2015-07-26 NOTE — Progress Notes (Addendum)
Subjective:   Laura [name] is a 70 y.o. female who presents for Medicare Annual (Subsequent) preventive examination.  Review of Systems:   HRA assessment completed during this visit with [name]  The Patient was informed that the wellness visit is to identify future health risk and educate and initiate measures that can reduce risk for increased disease through the lifespan.    NO ROS; Medicare Wellness Visit:  States [employer] called and stated that she needed AWV  Last OV:  06/29/2015 Labs completed: 06/20/2015 (A1c 7.5)  Lipids 04/12/14; HDL 53; LDL 166; Cho 258; Trig 196   Discussing long hx of vertigo; prednisone; and weight gain;  Not able to control DM but states she is getting up at different times, not exercising; eating more sweets; never used to like sweets. Also states she is hungry all the time; eating more;  Educated on the progression of diabetes ; BMI 35 and  Lifestyle review and risk: Father had DM; Sister had DM;   Psychosocial: lives alone;  Used to be slim and having difficulty with weight and self image and clothes are not fitting; weight gain brought on by physical illness and prednisone therapy. Has 2 closets of clothes that she can't wear.  Does engage and still can laugh, but overall, has stopped her activities and is overeating sweets; recently did stop eating ice cream   Tobacco: never smoked   How many drinks do you have per week?  Does not drink  Medications not taking PPI; states most of her issues were due meds   BMI: use to weigh 128 to 180 lbs and gained 40lb after steroids; Weight gain started with meds and now DM; Feels like she cannot lose weight;   Diet;   Eat breakfast; different hours of the day; oatmeal; blueberries On occasion will eat bacon and one egg Lunch sandwich; wheat bread; had peanut butter crackers today  Supper; sometimes she cooks;  Love vegetables;  Likes watermelon and fruit   Nutritional counseling given: to schedule  with the Diabetes and Nutritional center; Given information on a normal 1800 cal ADA diet Discussed the type of exercise that would help her lose weight;  Educated to break her goals down; will try to focus on eating good nutrients at meal time; Fup at Diabetes and Nutrition management   Exercise;  states last year she started walking  Was Working out and at [employer]; went one time last week  Strengths; love of gym and exercise  Exercises at home; does go to the gym one day; Can't seem to make herself go recently   [place]; lives alone Good neighbors  Fall hx; no   Given education on "Fall Prevention in the Home" for more safety tips the patient can apply as appropriate.  Long term goal is to "age in place" or undecided   Safety features reviewed for safe community; firearms if in the home; smoke alarms; sun protection when outside; driving difficulties or accidents  Mental Health:  Any emotional problems? Anxious, depressed, irritable, sad or blue? Yes;  How many social activities have you been engaged in within the last 2 weeks? Want get up to exercise; sometimes just stays in the bed unless she has an apt.  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 Does have inner ear;  Will wait a second when rising in the am  Mobilization and Functional losses from last year to this year?  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 States she did not have to have another one.  Stated she was told she did not need any more pneumonia vaccinations. educated on CDC recommendations; "states she got a shot after hours one visit and is concerned this did not get recorded" Would like to wait at least another year before retaking PSV23 again ; in lieu of thinking she may have taken. Will postpone until next year  Individual Goal: to eat more nutritious food at meal time   Health Recommendations and Referrals Risk for CV disease reviewed; Nutritional Counseling; Referral per Dr. Jenny Reichmann for Nutritional Counseling / educated on hyperglycemia and decreased insulin sensitivity / Wants to lose weight; expresses discouragement and lack of motivation; Is committed to the Diabetes and Nutrition center as well as fup with Dr. Ruthell Rummage regarding mood.   Barriers to Success Low mood   Current Care Team reviewed and updated Alva, Tonsina - Pulmonary 10/30/2014 2 visits since 08/08/2014 [facility] 09/20/2014 2 visits since 10/10/2013 Wert, Brantley - Pulmonary 07/24/2014 2 visits since 06/26/2014  Education provided and  lifestyle risk discussed   All Health Maintenance Gaps Reviewed for closure   Cardiac Risk Factors include: advanced age (>1mn, >>20women);diabetes mellitus;dyslipidemia;family history of premature cardiovascular disease;hypertension;obesity (BMI >30kg/m2);sedentary lifestyle     Objective:     Vitals: BP 142/70 mmHg  Ht _0  (1.626 m)  Wt 207 lb 8 oz (94.121 kg)  BMI 35.60 kg/m2  Body mass index is 35.6 kg/(m^2).   Tobacco History  Smoking status  . Never Smoker   Smokeless tobacco  . Never Used     Counseling given: Not Answered   Past Medical History  Diagnosis Date  . Diabetes mellitus   . Hyperlipidemia   . Hypertension   . Anxiety   . Reflux   . Vertigo   . Arthritis     back of neck, bones spurs on neck  . Sleep apnea     wears CPAP occasionally  . Cervical disc disease   . Neuropathy ([facility]    Past Surgical History  Procedure Laterality Date  . Bladder surgery    . Cholecystectomy    .  Partial hysterectomy    . Fibroids removed      breast (both breasts)  . Breast surgery     Family History  Problem Relation Age of Onset  . Hypertension Other   . Diabetes Father   . Diabetes Sister   . Diabetes Maternal Aunt   . Colon cancer Neg Hx   . Esophageal cancer Neg Hx   . Stomach cancer Neg Hx   . Rectal cancer Neg Hx    History  Sexual Activity  . Sexual Activity: Not Currently    Outpatient Encounter Prescriptions as of 07/26/2015  Medication Sig  . acetaminophen (TYLENOL) 325 MG tablet Take 325 mg by mouth as needed for mild pain, moderate pain, fever or headache.   Marland Kitchen amLODipine (NORVASC) 5 MG tablet TAKE ONE (1) TABLET BY MOUTH EVERY DAY  . Blood Glucose Monitoring Suppl (ONE TOUCH ULTRA 2) w/Device KIT Use as directed  . glimepiride (AMARYL) 2 MG tablet Take 1 tablet (2 mg total) by mouth daily before breakfast.  . Lancets (ONETOUCH ULTRASOFT) lancets USE FOR CHECKING BLOOD SUGARS TWICE DAILY  . loperamide (IMODIUM A-D) 2 MG tablet Take 2  mg by mouth daily as needed for diarrhea or loose stools.   Marland Kitchen LORazepam (ATIVAN) 0.5 MG tablet TAKE ONE (1) TABLET BY MOUTH EVERY DAY AS NEEDED FOR ANXIETY  . ONE TOUCH ULTRA TEST test strip USE TO CHECK BLOOD SUGARS DAILY  . OVER THE COUNTER MEDICATION Apply 1 application topically daily as needed (pain).  . valsartan (DIOVAN) 160 MG tablet Take 1 tablet (160 mg total) by mouth daily.  . meclizine (ANTIVERT) 12.5 MG tablet Take 1 tablet (12.5 mg total) by mouth 3 (three) times daily as needed for dizziness. (Patient not taking: Reported on 07/26/2015)  . omeprazole (PRILOSEC) 20 MG capsule TAKE ONE CAPSULE BY MOUTH DAILY (Patient not taking: Reported on 07/26/2015)   No facility-administered encounter medications on file as of 07/26/2015.    Activities of Daily Living In your present state of health, do you have any difficulty performing the following activities: 07/26/2015  Hearing? N  Vision? (No Data)  Difficulty concentrating or making decisions? N  Walking or climbing stairs? N  Dressing or bathing? N  Doing errands, shopping? N  Preparing Food and eating ? N  Using the Toilet? N  Managing your Medications? N  Managing your Finances? N  Housekeeping or managing your Housekeeping? N    Patient Care Team: Biagio Borg, MD as PCP - General (Internal Medicine) Milus Banister, MD as Attending Physician (Gastroenterology) Melida Quitter, MD as Attending Physician (Otolaryngology) Peter M Martinique, MD (Cardiology) Jessy Oto, MD as Consulting Physician (Orthopedic Surgery)    Assessment:    Spent 20 minutes discussing diabetes and possible reason she is seeing changes; as well as depression; Was referred to the Diabetes center and did not discuss chol directly, but did review an 1800 calorie diet and the restoration of good nutrients as well as exercise   Depressed mood surfacing secondary to lack of control over weight and DM; fatigue and lack of motivation per the patient. Discussed  options for possible medication via apt with Dr. Jenny Reichmann. The patient stated she had seen  Dr. Ruthell Rummage at [facility] in the past and agreed she would make an apt with Dr. Ruthell Rummage . Will follow up in 4 weeks to see how she is doing.   Multiple knowledge deficits regarding DM  And chol diet and would be a  good candidate for CCM once initiated   Will fup on eye exam and AD with [facility] if needed    Exercise Activities and Dietary recommendations Current Exercise Habits: Home exercise routine, Time (Minutes): 15, Frequency (Times/Week): 2 (got out of the habit on going to gym; see note), Weekly Exercise (Minutes/Week): 30  Goals    . Eat more fruits and vegetables     Will try to eat healthy nutrients for Breakfast; lunch and supper  Monitor portions       Fall Risk Fall Risk  07/26/2015 03/09/2015 08/30/2013 03/21/2013  Falls in the past year? No No No No   Depression Screen PHQ 2/9 Scores 07/26/2015 03/09/2015 08/30/2013 03/21/2013  PHQ - 2 Score 2 0 0 0  PHQ- 9 Score 12 - - -     Cognitive Testing MMSE - Mini Mental State Exam 07/26/2015  Not completed: (No Data)   AD8 score 1   Immunization History  Administered Date(s) Administered  . Influenza Split 01/06/2011, 10/22/2011  . Influenza, High Dose Seasonal PF 12/07/2012, 11/17/2013, 11/17/2013  . Pneumococcal Conjugate-13 02/15/2013  . Pneumococcal Polysaccharide-23 01/19/2006, 01/17/2008  . Td 01/15/2004  . Tdap 06/29/2015   Screening Tests Health Maintenance  Topic Date Due  . Hepatitis C Screening  [birth year]  . FOOT EXAM  08/31/2014  . ZOSTAVAX  07/24/2016 (Originally 11/16/2004)  . PNA vac Low Risk Adult (2 of 2 - PPSV23) 07/24/2016 (Originally 02/15/2014)  . INFLUENZA VACCINE  08/14/2015  . MAMMOGRAM  09/09/2015  . OPHTHALMOLOGY EXAM  10/05/2015  . HEMOGLOBIN A1C  12/20/2015  . COLONOSCOPY  08/19/2016  . TETANUS/TDAP  06/28/2025  . DEXA SCAN  Completed      Plan:   [facility] offers free  advance directive forms, as well as assistance in completing the forms themselves.  For assistance, contact the Spiritual Care Department at [phone], or the Clinical Social Work Department at [phone].  Declines shingles at this time; never had chicken pox, states Dr. Jenny Reichmann did not recommend  Also will hold PSV 23 x 1 year. Felt she had her Pneumonia vaccines and will consider in one year  Track food diary x 3 days prior to going to the DM and Nutrition center   Will fup with Dr. Ruthell Rummage from [facility] to fup on mood   During the course of the visit the patient was educated and counseled about the following appropriate screening and preventive services:   Vaccines to include Pneumoccal, Influenza, Hepatitis B, Td, Zostavax, HCV  Electrocardiogram  Cardiovascular Disease  Colorectal cancer screening  Bone density screening  Diabetes screening  Glaucoma screening  Mammography/PAP  Nutrition counseling   Patient Instructions (the written plan) was given to the patient.   Wynetta Fines, RN  07/26/2015  Medical screening examination/treatment/procedure(s) were performed by non-physician practitioner and as supervising physician I was immediately available for consultation/collaboration. I agree with above. Cathlean Cower, MD

## 2015-07-26 NOTE — Patient Instructions (Addendum)
Laura [name] , Thank you for taking time to come for your Medicare Wellness Visit. I appreciate your ongoing commitment to your health goals. Please review the following plan we discussed and let me know if I can assist you in the future.   [facility] offers free advance directive forms, as well as assistance in completing the forms themselves.  For assistance, contact the Spiritual Care Department at [phone], or the Clinical Social Work Department at [phone].  Declines shingles at this time; never had chicken pox, states Dr. Jenny Reichmann did not recommend  Also will hold PSV 23 x 1 year. Felt she had her Pneumonia vaccines and will consider in one year  Track food diary x 3 days prior to [facility]   Will fup with Dr. Ruthell Rummage from [facility] to fup on mood    These are the goals we discussed: Goals    . Eat more fruits and vegetables     Will try to eat healthy nutrients for Breakfast; lunch and supper  Monitor portions        This is a list of the screening recommended for you and due dates:  Health Maintenance  Topic Date Due  .  Hepatitis C: One time screening is recommended by Center for Disease Control  (CDC) for  adults born from [birth year] through 1966.   04-May-1945  . Shingles Vaccine  11/16/2004  . Pneumonia vaccines (2 of 2 - PPSV23) 02/15/2014  . Complete foot exam   08/31/2014  . Flu Shot  08/14/2015  . Mammogram  09/09/2015  . Eye exam for diabetics  10/05/2015  . Hemoglobin A1C  12/20/2015  . Colon Cancer Screening  08/19/2016  . Tetanus Vaccine  06/28/2025  . DEXA scan (bone density measurement)  Completed     Fall Prevention in the Home  Falls can cause injuries. They can happen to people of all ages. There are many things you can do to make your home safe and to help prevent falls.  WHAT CAN I DO ON THE OUTSIDE OF MY HOME?  Regularly fix the edges of walkways and driveways and fix any cracks.  Remove anything that might  make you trip as you walk through a door, such as a raised step or threshold.  Trim any bushes or trees on the path to your home.  Use bright outdoor lighting.  Clear any walking paths of anything that might make someone trip, such as rocks or tools.  Regularly check to see if handrails are loose or broken. Make sure that both sides of any steps have handrails.  Any raised decks and porches should have guardrails on the edges.  Have any leaves, snow, or ice cleared regularly.  Use sand or salt on walking paths during winter.  Clean up any spills in your garage right away. This includes oil or grease spills. WHAT CAN I DO IN THE BATHROOM?   Use night lights.  Install grab bars by the toilet and in the tub and shower. Do not use towel bars as grab bars.  Use non-skid mats or decals in the tub or shower.  If you need to sit down in the shower, use a plastic, non-slip stool.  Keep the floor dry. Clean up any water that spills on the floor as soon as it happens.  Remove soap buildup in the tub or shower regularly.  Attach bath mats securely with double-sided non-slip rug tape.  Do not have throw  rugs and other things on the floor that can make you trip. WHAT CAN I DO IN THE BEDROOM?  Use night lights.  Make sure that you have a light by your bed that is easy to reach.  Do not use any sheets or blankets that are too big for your bed. They should not hang down onto the floor.  Have a firm chair that has side arms. You can use this for support while you get dressed.  Do not have throw rugs and other things on the floor that can make you trip. WHAT CAN I DO IN THE KITCHEN?  Clean up any spills right away.  Avoid walking on wet floors.  Keep items that you use a lot in easy-to-reach places.  If you need to reach something above you, use a strong step stool that has a grab bar.  Keep electrical cords out of the way.  Do not use floor polish or wax that makes floors  slippery. If you must use wax, use non-skid floor wax.  Do not have throw rugs and other things on the floor that can make you trip. WHAT CAN I DO WITH MY STAIRS?  Do not leave any items on the stairs.  Make sure that there are handrails on both sides of the stairs and use them. Fix handrails that are broken or loose. Make sure that handrails are as long as the stairways.  Check any carpeting to make sure that it is firmly attached to the stairs. Fix any carpet that is loose or worn.  Avoid having throw rugs at the top or bottom of the stairs. If you do have throw rugs, attach them to the floor with carpet tape.  Make sure that you have a light switch at the top of the stairs and the bottom of the stairs. If you do not have them, ask someone to add them for you. WHAT ELSE CAN I DO TO HELP PREVENT FALLS?  Wear shoes that:  Do not have high heels.  Have rubber bottoms.  Are comfortable and fit you well.  Are closed at the toe. Do not wear sandals.  If you use a stepladder:  Make sure that it is fully opened. Do not climb a closed stepladder.  Make sure that both sides of the stepladder are locked into place.  Ask someone to hold it for you, if possible.  Clearly mark and make sure that you can see:  Any grab bars or handrails.  First and last steps.  Where the edge of each step is.  Use tools that help you move around (mobility aids) if they are needed. These include:  Canes.  Walkers.  Scooters.  Crutches.  Turn on the lights when you go into a dark area. Replace any light bulbs as soon as they burn out.  Set up your furniture so you have a clear path. Avoid moving your furniture around.  If any of your floors are uneven, fix them.  If there are any pets around you, be aware of where they are.  Review your medicines with your doctor. Some medicines can make you feel dizzy. This can increase your chance of falling. Ask your doctor what other things that you  can do to help prevent falls.   This information is not intended to replace advice given to you by your health care provider. Make sure you discuss any questions you have with your health care provider.   Document Released: 10/26/2008 Document Revised:  05/16/2014 Document Reviewed: 02/03/2014 Elsevier Interactive Patient Education 2016 [place] Maintenance, Female Adopting a healthy lifestyle and getting preventive care can go a long way to promote health and wellness. Talk with your health care provider about what schedule of regular examinations is right for you. This is a good chance for you to check in with your provider about disease prevention and staying healthy. In between checkups, there are plenty of things you can do on your own. Experts have done a lot of research about which lifestyle changes and preventive measures are most likely to keep you healthy. Ask your health care provider for more information. WEIGHT AND DIET  Eat a healthy diet  Be sure to include plenty of vegetables, fruits, low-fat dairy products, and lean protein.  Do not eat a lot of foods high in solid fats, added sugars, or salt.  Get regular exercise. This is one of the most important things you can do for your health.  Most adults should exercise for at least 150 minutes each week. The exercise should increase your heart rate and make you sweat (moderate-intensity exercise).  Most adults should also do strengthening exercises at least twice a week. This is in addition to the moderate-intensity exercise.  Maintain a healthy weight  Body mass index (BMI) is a measurement that can be used to identify possible weight problems. It estimates body fat based on height and weight. Your health care provider can help determine your BMI and help you achieve or maintain a healthy weight.  For females 8 years of age and older:   A BMI below 18.5 is considered underweight.  A BMI of 18.5 to 24.9 is  normal.  A BMI of 25 to 29.9 is considered overweight.  A BMI of 30 and above is considered obese.  Watch levels of cholesterol and blood lipids  You should start having your blood tested for lipids and cholesterol at 71 years of age, then have this test every 5 years.  You may need to have your cholesterol levels checked more often if:  Your lipid or cholesterol levels are high.  You are older than 71 years of age.  You are at high risk for heart disease.  CANCER SCREENING   Lung Cancer  Lung cancer screening is recommended for adults 92-66 years old who are at high risk for lung cancer because of a history of smoking.  A yearly low-dose CT scan of the lungs is recommended for people who:  Currently smoke.  Have quit within the past 15 years.  Have at least a 30-pack-year history of smoking. A pack year is smoking an average of one pack of cigarettes a day for 1 year.  Yearly screening should continue until it has been 15 years since you quit.  Yearly screening should stop if you develop a health problem that would prevent you from having lung cancer treatment.  Breast Cancer  Practice breast self-awareness. This means understanding how your breasts normally appear and feel.  It also means doing regular breast self-exams. Let your health care provider know about any changes, no matter how small.  If you are in your 20s or 30s, you should have a clinical breast exam (CBE) by a health care provider every 1-3 years as part of a regular health exam.  If you are 73 or older, have a CBE every year. Also consider having a breast X-ray (mammogram) every year.  If you have a family history of breast cancer,  talk to your health care provider about genetic screening.  If you are at high risk for breast cancer, talk to your health care provider about having an MRI and a mammogram every year.  Breast cancer gene (BRCA) assessment is recommended for women who have family members  with BRCA-related cancers. BRCA-related cancers include:  Breast.  Ovarian.  Tubal.  Peritoneal cancers.  Results of the assessment will determine the need for genetic counseling and BRCA1 and BRCA2 testing. Cervical Cancer Your health care provider may recommend that you be screened regularly for cancer of the pelvic organs (ovaries, uterus, and vagina). This screening involves a pelvic examination, including checking for microscopic changes to the surface of your cervix (Pap test). You may be encouraged to have this screening done every 3 years, beginning at age 75.  For women ages 6-65, health care providers may recommend pelvic exams and Pap testing every 3 years, or they may recommend the Pap and pelvic exam, combined with testing for human papilloma virus (HPV), every 5 years. Some types of HPV increase your risk of cervical cancer. Testing for HPV may also be done on women of any age with unclear Pap test results.  Other health care providers may not recommend any screening for nonpregnant women who are considered low risk for pelvic cancer and who do not have symptoms. Ask your health care provider if a screening pelvic exam is right for you.  If you have had past treatment for cervical cancer or a condition that could lead to cancer, you need Pap tests and screening for cancer for at least 20 years after your treatment. If Pap tests have been discontinued, your risk factors (such as having a new sexual partner) need to be reassessed to determine if screening should resume. Some women have medical problems that increase the chance of getting cervical cancer. In these cases, your health care provider may recommend more frequent screening and Pap tests. Colorectal Cancer  This type of cancer can be detected and often prevented.  Routine colorectal cancer screening usually begins at 71 years of age and continues through 71 years of age.  Your health care provider may recommend  screening at an earlier age if you have risk factors for colon cancer.  Your health care provider may also recommend using home test kits to check for hidden blood in the stool.  A small camera at the end of a tube can be used to examine your colon directly (sigmoidoscopy or colonoscopy). This is done to check for the earliest forms of colorectal cancer.  Routine screening usually begins at age 54.  Direct examination of the colon should be repeated every 5-10 years through 71 years of age. However, you may need to be screened more often if early forms of precancerous polyps or small growths are found. Skin Cancer  Check your skin from head to toe regularly.  Tell your health care provider about any new moles or changes in moles, especially if there is a change in a mole's shape or color.  Also tell your health care provider if you have a mole that is larger than the size of a pencil eraser.  Always use sunscreen. Apply sunscreen liberally and repeatedly throughout the day.  Protect yourself by wearing long sleeves, pants, a wide-brimmed hat, and sunglasses whenever you are outside. HEART DISEASE, DIABETES, AND HIGH BLOOD PRESSURE   High blood pressure causes heart disease and increases the risk of stroke. High blood pressure is more likely to  develop in:  People who have blood pressure in the high end of the normal range (130-139/85-89 mm Hg).  People who are overweight or obese.  People who are African American.  If you are 72-27 years of age, have your blood pressure checked every 3-5 years. If you are 47 years of age or older, have your blood pressure checked every year. You should have your blood pressure measured twice--once when you are at a hospital or clinic, and once when you are not at a hospital or clinic. Record the average of the two measurements. To check your blood pressure when you are not at a hospital or clinic, you can use:  An automated blood pressure machine at a  pharmacy.  A home blood pressure monitor.  If you are between 67 years and 64 years old, ask your health care provider if you should take aspirin to prevent strokes.  Have regular diabetes screenings. This involves taking a blood sample to check your fasting blood sugar level.  If you are at a normal weight and have a low risk for diabetes, have this test once every three years after 71 years of age.  If you are overweight and have a high risk for diabetes, consider being tested at a younger age or more often. PREVENTING INFECTION  Hepatitis B  If you have a higher risk for hepatitis B, you should be screened for this virus. You are considered at high risk for hepatitis B if:  You were born in a country where hepatitis B is common. Ask your health care provider which countries are considered high risk.  Your parents were born in a high-risk country, and you have not been immunized against hepatitis B (hepatitis B vaccine).  You have HIV or AIDS.  You use needles to inject street drugs.  You live with someone who has hepatitis B.  You have had sex with someone who has hepatitis B.  You get hemodialysis treatment.  You take certain medicines for conditions, including cancer, organ transplantation, and autoimmune conditions. Hepatitis C  Blood testing is recommended for:  Everyone born from [birth year] through 1966.  Anyone with known risk factors for hepatitis C. Sexually transmitted infections (STIs)  You should be screened for sexually transmitted infections (STIs) including gonorrhea and chlamydia if:  You are sexually active and are younger than 71 years of age.  You are older than 71 years of age and your health care provider tells you that you are at risk for this type of infection.  Your sexual activity has changed since you were last screened and you are at an increased risk for chlamydia or gonorrhea. Ask your health care provider if you are at risk.  If you do not  have HIV, but are at risk, it may be recommended that you take a prescription medicine daily to prevent HIV infection. This is called pre-exposure prophylaxis (PrEP). You are considered at risk if:  You are sexually active and do not regularly use condoms or know the HIV status of your partner(s).  You take drugs by injection.  You are sexually active with a partner who has HIV. Talk with your health care provider about whether you are at high risk of being infected with HIV. If you choose to begin PrEP, you should first be tested for HIV. You should then be tested every 3 months for as long as you are taking PrEP.  PREGNANCY   If you are premenopausal and you may become pregnant,  ask your health care provider about preconception counseling.  If you may become pregnant, take 400 to 800 micrograms (mcg) of folic acid every day.  If you want to prevent pregnancy, talk to your health care provider about birth control (contraception). OSTEOPOROSIS AND MENOPAUSE   Osteoporosis is a disease in which the bones lose minerals and strength with aging. This can result in serious bone fractures. Your risk for osteoporosis can be identified using a bone density scan.  If you are 47 years of age or older, or if you are at risk for osteoporosis and fractures, ask your health care provider if you should be screened.  Ask your health care provider whether you should take a calcium or vitamin D supplement to lower your risk for osteoporosis.  Menopause may have certain physical symptoms and risks.  Hormone replacement therapy may reduce some of these symptoms and risks. Talk to your health care provider about whether hormone replacement therapy is right for you.  HOME CARE INSTRUCTIONS   Schedule regular health, dental, and eye exams.  Stay current with your immunizations.   Do not use any tobacco products including cigarettes, chewing tobacco, or electronic cigarettes.  If you are pregnant, do not  drink alcohol.  If you are breastfeeding, limit how much and how often you drink alcohol.  Limit alcohol intake to no more than 1 drink per day for nonpregnant women. One drink equals 12 ounces of beer, 5 ounces of wine, or 1 ounces of hard liquor.  Do not use street drugs.  Do not share needles.  Ask your health care provider for help if you need support or information about quitting drugs.  Tell your health care provider if you often feel depressed.  Tell your health care provider if you have ever been abused or do not feel safe at home.   This information is not intended to replace advice given to you by your health care provider. Make sure you discuss any questions you have with your health care provider.   Document Released: 07/15/2010 Document Revised: 01/20/2014 Document Reviewed: 12/01/2012 Elsevier Interactive Patient Education [employer].

## 2015-08-01 DIAGNOSIS — G4733 Obstructive sleep apnea (adult) (pediatric): Secondary | ICD-10-CM | POA: Diagnosis not present

## 2015-09-06 DIAGNOSIS — E119 Type 2 diabetes mellitus without complications: Secondary | ICD-10-CM | POA: Diagnosis not present

## 2015-09-06 DIAGNOSIS — Z0100 Encounter for examination of eyes and vision without abnormal findings: Secondary | ICD-10-CM | POA: Diagnosis not present

## 2015-09-06 DIAGNOSIS — M19012 Primary osteoarthritis, left shoulder: Secondary | ICD-10-CM | POA: Diagnosis not present

## 2015-09-06 DIAGNOSIS — H25013 Cortical age-related cataract, bilateral: Secondary | ICD-10-CM | POA: Diagnosis not present

## 2015-09-06 DIAGNOSIS — M19011 Primary osteoarthritis, right shoulder: Secondary | ICD-10-CM | POA: Diagnosis not present

## 2015-09-06 DIAGNOSIS — M25512 Pain in left shoulder: Secondary | ICD-10-CM | POA: Diagnosis not present

## 2015-09-06 DIAGNOSIS — M4722 Other spondylosis with radiculopathy, cervical region: Secondary | ICD-10-CM | POA: Diagnosis not present

## 2015-09-06 DIAGNOSIS — H2513 Age-related nuclear cataract, bilateral: Secondary | ICD-10-CM | POA: Diagnosis not present

## 2015-09-06 LAB — HM DIABETES EYE EXAM

## 2015-09-10 ENCOUNTER — Encounter: Payer: Self-pay | Admitting: Internal Medicine

## 2015-10-10 ENCOUNTER — Other Ambulatory Visit: Payer: Self-pay | Admitting: Internal Medicine

## 2015-10-18 ENCOUNTER — Encounter (HOSPITAL_COMMUNITY): Payer: Self-pay

## 2015-10-18 ENCOUNTER — Emergency Department (HOSPITAL_COMMUNITY)
Admission: EM | Admit: 2015-10-18 | Discharge: 2015-10-18 | Disposition: A | Discharge status: Home / Self Care | Payer: Medicare Other | Attending: Emergency Medicine | Admitting: Emergency Medicine

## 2015-10-18 ENCOUNTER — Ambulatory Visit: Payer: Medicare Other | Admitting: Internal Medicine

## 2015-10-18 DIAGNOSIS — Z79899 Other long term (current) drug therapy: Secondary | ICD-10-CM | POA: Diagnosis not present

## 2015-10-18 DIAGNOSIS — I6789 Other cerebrovascular disease: Secondary | ICD-10-CM | POA: Diagnosis not present

## 2015-10-18 DIAGNOSIS — R42 Dizziness and giddiness: Secondary | ICD-10-CM | POA: Diagnosis not present

## 2015-10-18 DIAGNOSIS — I10 Essential (primary) hypertension: Secondary | ICD-10-CM | POA: Insufficient documentation

## 2015-10-18 DIAGNOSIS — E119 Type 2 diabetes mellitus without complications: Secondary | ICD-10-CM | POA: Insufficient documentation

## 2015-10-18 LAB — URINALYSIS, ROUTINE W REFLEX MICROSCOPIC
BILIRUBIN URINE: NEGATIVE
GLUCOSE, UA: NEGATIVE mg/dL
HGB URINE DIPSTICK: NEGATIVE
KETONES UR: NEGATIVE mg/dL
Leukocytes, UA: NEGATIVE
NITRITE: NEGATIVE
PH: 6.5 (ref 5.0–8.0)
Protein, ur: NEGATIVE mg/dL
Specific Gravity, Urine: <1.005 — ABNORMAL LOW (ref 1.005–1.030)

## 2015-10-18 LAB — BASIC METABOLIC PANEL WITH GFR
Anion gap: 6 (ref 5–15)
BUN: 12 mg/dL (ref 6–20)
CO2: 26 mmol/L (ref 22–32)
Calcium: 9.9 mg/dL (ref 8.9–10.3)
Chloride: 107 mmol/L (ref 101–111)
Creatinine, Ser: 0.73 mg/dL (ref 0.44–1.00)
GFR calc Af Amer: >60 mL/min
GFR calc non Af Amer: >60 mL/min
Glucose, Bld: 115 mg/dL — ABNORMAL HIGH (ref 65–99)
Potassium: 4.2 mmol/L (ref 3.5–5.1)
Sodium: 139 mmol/L (ref 135–145)

## 2015-10-18 LAB — CBC WITH DIFFERENTIAL/PLATELET
Basophils Absolute: 0 10*3/uL (ref 0.0–0.1)
Basophils Relative: 0 %
Eosinophils Absolute: 0.1 10*3/uL (ref 0.0–0.7)
Eosinophils Relative: 1 %
HEMATOCRIT: 38.3 % (ref 36.0–46.0)
HEMOGLOBIN: 13 g/dL (ref 12.0–15.0)
LYMPHS ABS: 2.5 10*3/uL (ref 0.7–4.0)
LYMPHS PCT: 33 %
MCH: 29.4 pg (ref 26.0–34.0)
MCHC: 33.9 g/dL (ref 30.0–36.0)
MCV: 86.7 fL (ref 78.0–100.0)
MONO ABS: 0.5 10*3/uL (ref 0.1–1.0)
MONOS PCT: 7 %
NEUTROS ABS: 4.6 10*3/uL (ref 1.7–7.7)
Neutrophils Relative %: 59 %
Platelets: 262 10*3/uL (ref 150–400)
RBC: 4.42 MIL/uL (ref 3.87–5.11)
RDW: 14 % (ref 11.5–15.5)
WBC: 7.7 10*3/uL (ref 4.0–10.5)

## 2015-10-18 NOTE — ED Triage Notes (Signed)
Pt reports feeling off balance since last evening before she went to bed.  Pt says woke up still feeling off balance.  [facility] reports pt's bp was elevated with them at 158/94 and cbg 153.  Pt says has history of vertigo but says this feels different.  Denies any pain.

## 2015-10-18 NOTE — ED Provider Notes (Signed)
[facility] DEPT Provider Note   CSN: [id number] Arrival date & time: 10/18/15  1411     History   Chief Complaint Chief Complaint  Patient presents with  . Dizziness    HPI Laura [name] is a 70 y.o. female.  HPI Patient presents with vertigo. States she felt as if things were moving around her. States she had last night and came on again this morning. States she then began to feel anxious and have little difficulty breathing with it. States she had some difficulty speaking. She has had previous episodes of vertigo and had to go to vestibular therapy for it. She states it helped a lot. She states that she took a Antivert and lorazepam (help significantly. States she still feels a little bit unsteady. No localizing numbness or weakness. No chest pain. She states she does feel a slight fullness in her head. She states she already has had an MRI this year.Abdomen is   Past Medical History:  Diagnosis Date  . Anxiety   . Arthritis    back of neck, bones spurs on neck  . Cervical disc disease   . Diabetes mellitus   . Hyperlipidemia   . Hypertension   . Neuropathy ([place])   . Reflux   . Sleep apnea    wears CPAP occasionally  . Vertigo     Patient Active Problem List   Diagnosis Date Noted  . Urinary frequency 03/09/2015  . Dizziness and giddiness 08/09/2014  . Morbid obesity ([place]) 07/25/2014  . Orthostatic hypotension 07/07/2014  . Upper airway cough syndrome 06/26/2014  . Diarrhea 05/02/2014  . Dizziness 04/12/2014  . Neuropathy ([place])   . Rash and nonspecific skin eruption 08/30/2013  . Paresthesia of both feet 08/30/2013  . Blurred vision, bilateral 02/15/2013  . Eye pain 02/15/2013  . Sinusitis, chronic 10/07/2012  . Left shoulder pain 04/15/2012  . Encounter for preventative adult health care exam with abnormal findings 10/22/2011  . Cervical disc disease 10/22/2011  . Bilateral hand pain 08/05/2011  . Reflux 06/29/2011  . Bloating 06/29/2011  . Back pain  03/13/2011  . Vertigo 01/04/2011  . Nystagmus 01/04/2011  . Insomnia 10/21/2010  . CONSTIPATION 08/17/2009  . VITAMIN D DEFICIENCY 05/15/2009  . DYSPNEA 03/13/2009  . Essential hypertension, benign 01/30/2009  . DEPRESSION 01/16/2009  . Irritable bowel syndrome 01/16/2009  . OSTEOARTHRITIS 12/15/2008  . Diabetes ([place]) 11/03/2008  . Hyperlipidemia 11/03/2008  . Anxiety state 11/03/2008  . OVERACTIVE BLADDER 11/03/2008  . OSA (obstructive sleep apnea) 11/03/2008  . MURMUR 11/03/2008    Past Surgical History:  Procedure Laterality Date  . BLADDER SURGERY    . BREAST SURGERY    . CHOLECYSTECTOMY    . fibroids removed     breast (both breasts)  . PARTIAL HYSTERECTOMY      OB History    No data available       Home Medications    Prior to Admission medications   Medication Sig Start Date End Date Taking? Authorizing Provider  acetaminophen (TYLENOL) 325 MG tablet Take 325 mg by mouth as needed for mild pain, moderate pain, fever or headache.     Historical Provider, MD  amLODipine (NORVASC) 5 MG tablet TAKE ONE (1) TABLET BY MOUTH EVERY DAY 07/25/15   Biagio Borg, MD  Blood Glucose Monitoring Suppl (ONE TOUCH ULTRA 2) w/Device KIT Use as directed 04/06/15   Biagio Borg, MD  glimepiride (AMARYL) 2 MG tablet Take 1 tablet (2 mg total) by  mouth daily before breakfast. 06/20/15   Evie Lacks Plotnikov, MD  Lancets [facility] ULTRASOFT) lancets USE FOR CHECKING BLOOD SUGARS TWICE DAILY 11/07/13   Biagio Borg, MD  loperamide (IMODIUM A-D) 2 MG tablet Take 2 mg by mouth daily as needed for diarrhea or loose stools.     Historical Provider, MD  LORazepam (ATIVAN) 0.5 MG tablet TAKE ONE (1) TABLET BY MOUTH EVERY DAY AS NEEDED FOR ANXIETY 06/29/15   Biagio Borg, MD  meclizine (ANTIVERT) 12.5 MG tablet Take 1 tablet (12.5 mg total) by mouth 3 (three) times daily as needed for dizziness. Patient not taking: Reported on 07/26/2015 08/09/14   Biagio Borg, MD  omeprazole (PRILOSEC) 20 MG  capsule TAKE ONE CAPSULE BY MOUTH DAILY Patient not taking: Reported on 07/26/2015 10/03/14   Biagio Borg, MD  ONE [facility] ULTRA TEST test strip USE TO CHECK BLOOD SUGARS DAILY 06/19/15   Biagio Borg, MD  OVER THE COUNTER MEDICATION Apply 1 application topically daily as needed (pain).    Historical Provider, MD  valsartan (DIOVAN) 160 MG tablet Take 1 tablet (160 mg total) by mouth daily. 06/20/15   Evie Lacks Plotnikov, MD  valsartan (DIOVAN) 160 MG tablet TAKE ONE (1) TABLET BY MOUTH EVERY DAY 10/10/15   Biagio Borg, MD    Family History Family History  Problem Relation Age of Onset  . Hypertension Other   . Diabetes Father   . Diabetes Sister   . Diabetes Maternal Aunt   . Colon cancer Neg Hx   . Esophageal cancer Neg Hx   . Stomach cancer Neg Hx   . Rectal cancer Neg Hx     Social History Social History  Substance Use Topics  . Smoking status: Never Smoker  . Smokeless tobacco: Never Used  . Alcohol use No     Allergies   Sulfa antibiotics; Codeine; Crestor [rosuvastatin calcium]; Hydrocodone-homatropine; Keflex [cephalexin]; Lipitor [atorvastatin calcium]; and Prednisone   Review of Systems Review of Systems  Constitutional: Negative for appetite change.  Respiratory: Negative for shortness of breath.   Cardiovascular: Negative for chest pain.  Gastrointestinal: Negative for abdominal pain.  Genitourinary: Negative for dysuria.  Musculoskeletal: Positive for gait problem. Negative for back pain and joint swelling.  Skin: Negative for wound.  Neurological: Positive for dizziness and speech difficulty.  Hematological: Negative for adenopathy.  Psychiatric/Behavioral: Negative for confusion.     Physical Exam Updated Vital Signs BP 126/78 (BP Location: Right Arm)   Pulse 64   Temp 98.2 F (36.8 C) (Oral)   Resp 20   Ht _0  (1.626 m)   Wt 198 lb (89.8 kg)   SpO2 100%   BMI 33.99 kg/m   Physical Exam  Constitutional: She appears well-developed.  Eyes:  Pupils are equal, round, and reactive to light.  Some nystagmus with end gaze to left and right.  Neck: Neck supple.  Cardiovascular: Normal rate.   Pulmonary/Chest: Effort normal.  Abdominal: Soft.  Musculoskeletal: She exhibits no edema.  Neurological: She is alert.  Finger-nose and heel-to-shin intact bilaterally.  Skin: Skin is warm. Capillary refill takes less than 2 seconds.  Psychiatric: She has a normal mood and affect.     ED Treatments / Results  Labs (all labs ordered are listed, but only abnormal results are displayed) Labs Reviewed  CBC WITH DIFFERENTIAL/PLATELET  BASIC METABOLIC PANEL  URINALYSIS, ROUTINE W REFLEX MICROSCOPIC (NOT AT [facility])    EKG  EKG Interpretation None  Radiology No results found.  Procedures Procedures (including critical care time)  Medications Ordered in ED Medications - No data to display   Initial Impression / Assessment and Plan / ED Course  I have reviewed the triage vital signs and the nursing notes.  Pertinent labs & imaging results that were available during my care of the patient were reviewed by me and considered in my medical decision making (see chart for details).  Clinical Course    Patient with vertigo. History of same. All things moving around. I think central vertigo less likely at this time with previous symptoms and previous negative MRIs. Feels somewhat better. Will discharge home.  Final Clinical Impressions(s) / ED Diagnoses   Final diagnoses:  None    New Prescriptions New Prescriptions   No medications on file     Davonna Belling, MD 10/18/15 1622

## 2015-10-18 NOTE — Discharge Instructions (Signed)
Follow with your vertigo doctor as needed.

## 2015-10-24 ENCOUNTER — Other Ambulatory Visit: Payer: Self-pay | Admitting: Adult Health

## 2015-10-24 ENCOUNTER — Ambulatory Visit (INDEPENDENT_AMBULATORY_CARE_PROVIDER_SITE_OTHER): Payer: Medicare Other | Admitting: Adult Health

## 2015-10-24 ENCOUNTER — Telehealth: Payer: Self-pay | Admitting: Adult Health

## 2015-10-24 ENCOUNTER — Encounter: Payer: Self-pay | Admitting: Adult Health

## 2015-10-24 VITALS — BP 160/70 | Ht 64.0 in | Wt 202.4 lb

## 2015-10-24 DIAGNOSIS — J069 Acute upper respiratory infection, unspecified: Secondary | ICD-10-CM | POA: Diagnosis not present

## 2015-10-24 MED ORDER — FLUCONAZOLE 150 MG PO TABS: 150.0000 mg | ORAL_TABLET | Freq: Once | ORAL | 1 refills | Status: AC

## 2015-10-24 MED ORDER — BENZONATATE 200 MG PO CAPS: 200.0000 mg | ORAL_CAPSULE | Freq: Three times a day (TID) | ORAL | 0 refills | Status: DC | PRN

## 2015-10-24 MED ORDER — AMOXICILLIN 500 MG PO CAPS: 500.0000 mg | ORAL_CAPSULE | Freq: Three times a day (TID) | ORAL | 0 refills | Status: DC

## 2015-10-24 NOTE — Patient Instructions (Addendum)
It was great meeting you today!  Your exam is consistent with an upper respiratory infection.   I have sent in Amoxicillin and Tessalon pearls, take these as directed.   Follow up if no improvement in the next 2-3 days   Upper Respiratory Infection, Adult Most upper respiratory infections (URIs) are a viral infection of the air passages leading to the lungs. A URI affects the nose, throat, and upper air passages. The most common type of URI is nasopharyngitis and is typically referred to as "the common cold." URIs run their course and usually go away on their own. Most of the time, a URI does not require medical attention, but sometimes a bacterial infection in the upper airways can follow a viral infection. This is called a secondary infection. Sinus and middle ear infections are common types of secondary upper respiratory infections. Bacterial pneumonia can also complicate a URI. A URI can worsen asthma and chronic obstructive pulmonary disease (COPD). Sometimes, these complications can require emergency medical care and may be life threatening.  CAUSES Almost all URIs are caused by viruses. A virus is a type of germ and can spread from one person to another.  RISKS FACTORS You may be at risk for a URI if:   You smoke.   You have chronic heart or lung disease.  You have a weakened defense (immune) system.   You are very young or very old.   You have nasal allergies or asthma.  You work in crowded or poorly ventilated areas.  You work in health care facilities or schools. SIGNS AND SYMPTOMS  Symptoms typically develop 2-3 days after you come in contact with a cold virus. Most viral URIs last 7-10 days. However, viral URIs from the influenza virus (flu virus) can last 14-18 days and are typically more severe. Symptoms may include:   Runny or stuffy (congested) nose.   Sneezing.   Cough.   Sore throat.   Headache.   Fatigue.   Fever.   Loss of appetite.    Pain in your forehead, behind your eyes, and over your cheekbones (sinus pain).  Muscle aches.  DIAGNOSIS  Your health care provider may diagnose a URI by:  Physical exam.  Tests to check that your symptoms are not due to another condition such as:  Strep throat.  Sinusitis.  Pneumonia.  Asthma. TREATMENT  A URI goes away on its own with time. It cannot be cured with medicines, but medicines may be prescribed or recommended to relieve symptoms. Medicines may help:  Reduce your fever.  Reduce your cough.  Relieve nasal congestion. HOME CARE INSTRUCTIONS   Take medicines only as directed by your health care provider.   Gargle warm saltwater or take cough drops to comfort your throat as directed by your health care provider.  Use a warm mist humidifier or inhale steam from a shower to increase air moisture. This may make it easier to breathe.  Drink enough fluid to keep your urine clear or pale yellow.   Eat soups and other clear broths and maintain good nutrition.   Rest as needed.   Return to work when your temperature has returned to normal or as your health care provider advises. You may need to stay home longer to avoid infecting others. You can also use a face mask and careful hand washing to prevent spread of the virus.  Increase the usage of your inhaler if you have asthma.   Do not use any tobacco products, including  cigarettes, chewing tobacco, or electronic cigarettes. If you need help quitting, ask your health care provider. PREVENTION  The best way to protect yourself from getting a cold is to practice good hygiene.   Avoid oral or hand contact with people with cold symptoms.   Wash your hands often if contact occurs.  There is no clear evidence that vitamin C, vitamin E, echinacea, or exercise reduces the chance of developing a cold. However, it is always recommended to get plenty of rest, exercise, and practice good nutrition.  SEEK MEDICAL  CARE IF:   You are getting worse rather than better.   Your symptoms are not controlled by medicine.   You have chills.  You have worsening shortness of breath.  You have brown or red mucus.  You have yellow or brown nasal discharge.  You have pain in your face, especially when you bend forward.  You have a fever.  You have swollen neck glands.  You have pain while swallowing.  You have white areas in the back of your throat. SEEK IMMEDIATE MEDICAL CARE IF:   You have severe or persistent:  Headache.  Ear pain.  Sinus pain.  Chest pain.  You have chronic lung disease and any of the following:  Wheezing.  Prolonged cough.  Coughing up blood.  A change in your usual mucus.  You have a stiff neck.  You have changes in your:  Vision.  Hearing.  Thinking.  Mood. MAKE SURE YOU:   Understand these instructions.  Will watch your condition.  Will get help right away if you are not doing well or get worse.   This information is not intended to replace advice given to you by your health care provider. Make sure you discuss any questions you have with your health care provider.   Document Released: 06/25/2000 Document Revised: 05/16/2014 Document Reviewed: 04/06/2013 Elsevier Interactive Patient Education [employer].

## 2015-10-24 NOTE — Telephone Encounter (Signed)
Please advise 

## 2015-10-24 NOTE — Telephone Encounter (Signed)
Pt was seen today and prescribed abx. Pt would like diflucan

## 2015-10-24 NOTE — Telephone Encounter (Signed)
Diflucan sent in

## 2015-10-24 NOTE — Progress Notes (Signed)
Subjective:    Patient ID: Laura [name], female    DOB: 01/28/1945, 70 y.o.   MRN: [id number]  URI   This is a new problem. The current episode started 1 to 4 weeks ago (2 weeks). The problem has been gradually worsening. There has been no fever. Associated symptoms include congestion, coughing (semi productive ), sinus pain, a sore throat and wheezing. Pertinent negatives include no ear pain or rhinorrhea. She has tried decongestant (benadryl ) for the symptoms. The treatment provided mild relief.     Review of Systems  Constitutional: Positive for activity change and fatigue.  HENT: Positive for congestion, postnasal drip, sinus pressure and sore throat. Negative for ear discharge, ear pain, rhinorrhea and trouble swallowing.   Respiratory: Positive for cough (semi productive ) and wheezing. Negative for apnea, chest tightness and shortness of breath.   Cardiovascular: Negative.   Gastrointestinal: Negative.   Neurological: Negative.    Past Medical History:  Diagnosis Date  . Anxiety   . Arthritis    back of neck, bones spurs on neck  . Cervical disc disease   . Diabetes mellitus   . Hyperlipidemia   . Hypertension   . Neuropathy ([place])   . Reflux   . Sleep apnea    wears CPAP occasionally  . Vertigo     Social History   Social History  . Marital status: Divorced    Spouse name: N/A  . Number of children: 4  . Years of education: 35   Occupational History  . 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 Smoking status: Never Smoker  . Smokeless tobacco: Never Used  . Alcohol use No  . Drug use: No  . Sexual activity: Not Currently   Other Topics Concern  . Not on file   Social History Narrative  . No narrative on file    Past Surgical History:  Procedure Laterality Date  . BLADDER SURGERY    . BREAST SURGERY    . CHOLECYSTECTOMY    . fibroids removed     breast (both breasts)  . PARTIAL HYSTERECTOMY      Family History    Problem Relation Age of Onset  . Hypertension Other   . Diabetes Father   . Diabetes Sister   . Diabetes Maternal Aunt   . Colon cancer Neg Hx   . Esophageal cancer Neg Hx   . Stomach cancer Neg Hx   . Rectal cancer Neg Hx     Allergies  Allergen Reactions  . Sulfa Antibiotics     Tongue swells, hives, itching  . Codeine     itch  . Crestor [Rosuvastatin Calcium] Other (See Comments)    Did something to memory   . Hydrocodone-Homatropine Other (See Comments)    Vertigo *pt strongly prefers to never take*  . Keflex [Cephalexin] Diarrhea and Nausea And Vomiting  . Lipitor [Atorvastatin Calcium] Other (See Comments)    Makes weak   . Prednisone Other (See Comments)    Nervous *pt strongly prefers to never be given prednisone*     Current Outpatient Prescriptions on File Prior to Visit  Medication Sig Dispense Refill  . acetaminophen (TYLENOL) 325 MG tablet Take 325 mg by mouth as needed for mild pain, moderate pain, fever or headache.     Marland Kitchen amLODipine (NORVASC) 5 MG tablet TAKE ONE (1) TABLET BY MOUTH EVERY DAY 90 tablet 3  . Blood Glucose Monitoring Suppl (ONE TOUCH ULTRA  2) w/Device KIT Use as directed 1 each 0  . glimepiride (AMARYL) 2 MG tablet Take 1 tablet (2 mg total) by mouth daily before breakfast. 90 tablet 3  . Lancets (ONETOUCH ULTRASOFT) lancets USE FOR CHECKING BLOOD SUGARS TWICE DAILY 100 each 3  . loperamide (IMODIUM A-D) 2 MG tablet Take 2 mg by mouth daily as needed for diarrhea or loose stools.     Marland Kitchen LORazepam (ATIVAN) 0.5 MG tablet TAKE ONE (1) TABLET BY MOUTH EVERY DAY AS NEEDED FOR ANXIETY 30 tablet 2  . meclizine (ANTIVERT) 12.5 MG tablet Take 1 tablet (12.5 mg total) by mouth 3 (three) times daily as needed for dizziness. 30 tablet 1  . omeprazole (PRILOSEC) 20 MG capsule TAKE ONE CAPSULE BY MOUTH DAILY 90 capsule 1  . ONE TOUCH ULTRA TEST test strip USE TO CHECK BLOOD SUGARS DAILY 50 each 0  . OVER THE COUNTER MEDICATION Apply 1 application  topically daily as needed (pain).    . valsartan (DIOVAN) 160 MG tablet Take 1 tablet (160 mg total) by mouth daily. 90 tablet 3  . valsartan (DIOVAN) 160 MG tablet TAKE ONE (1) TABLET BY MOUTH EVERY DAY 90 tablet 0   No current facility-administered medications on file prior to visit.     BP (!) 160/70   Ht 5' 4"  (1.626 m)   Wt 202 lb 6.4 oz (91.8 kg)   BMI 34.74 kg/m       Objective:   Physical Exam  Constitutional: She is oriented to person, place, and time. She appears well-developed and well-nourished. No distress.  HENT:  Head: Normocephalic and atraumatic.  Right Ear: Hearing, tympanic membrane, external ear and ear canal normal. Tympanic membrane is not erythematous and not bulging.  Left Ear: Hearing, tympanic membrane, external ear and ear canal normal. Tympanic membrane is not erythematous and not bulging.  Nose: Mucosal edema present. No rhinorrhea. Right sinus exhibits maxillary sinus tenderness. Right sinus exhibits no frontal sinus tenderness. Left sinus exhibits maxillary sinus tenderness.  Mouth/Throat: Uvula is midline and mucous membranes are normal. Oropharyngeal exudate present.  Eyes: Conjunctivae and EOM are normal. Pupils are equal, round, and reactive to light. Right eye exhibits no discharge. Left eye exhibits no discharge. No scleral icterus.  Cardiovascular: Normal rate, normal heart sounds and intact distal pulses.   Pulmonary/Chest: Effort normal and breath sounds normal. No respiratory distress. She has no wheezes. She has no rales. She exhibits no tenderness.  Musculoskeletal: Normal range of motion. She exhibits no edema, tenderness or deformity.  Neurological: She is alert and oriented to person, place, and time. She has normal reflexes.  Skin: Skin is warm and dry. No rash noted. She is not diaphoretic. No erythema. No pallor.  Psychiatric: She has a normal mood and affect. Her behavior is normal. Judgment and thought content normal.  Nursing note and  vitals reviewed.     Assessment & Plan:  1. Acute upper respiratory infection - Symptoms resemble URI. Will treat due to time frame. She does not want prednisone. Mucinex makes her have vertigo. She has multiple abx allergies but has taken amoxicillin in the past without side effects - benzonatate (TESSALON) 200 MG capsule; Take 1 capsule (200 mg total) by mouth 3 (three) times daily as needed for cough.  Dispense: 20 capsule; Refill: 0 - amoxicillin (AMOXIL) 500 MG capsule; Take 1 capsule (500 mg total) by mouth 3 (three) times daily.  Dispense: 30 capsule; Refill: 0 - Follow up in 3 days if  no improvement or sooner if symptoms worsen  - Can also use flonase to help with symptoms  Dorothyann Peng, NP

## 2015-10-31 ENCOUNTER — Encounter: Payer: Self-pay | Admitting: Adult Health

## 2015-10-31 ENCOUNTER — Ambulatory Visit (INDEPENDENT_AMBULATORY_CARE_PROVIDER_SITE_OTHER): Payer: Medicare Other | Admitting: Adult Health

## 2015-10-31 ENCOUNTER — Ambulatory Visit (INDEPENDENT_AMBULATORY_CARE_PROVIDER_SITE_OTHER)
Admission: RE | Admit: 2015-10-31 | Discharge: 2015-10-31 | Disposition: A | Discharge status: Home / Self Care | Payer: Medicare Other | Source: Ambulatory Visit | Attending: Adult Health | Admitting: Adult Health

## 2015-10-31 DIAGNOSIS — J209 Acute bronchitis, unspecified: Secondary | ICD-10-CM | POA: Diagnosis not present

## 2015-10-31 DIAGNOSIS — R0602 Shortness of breath: Secondary | ICD-10-CM | POA: Diagnosis not present

## 2015-10-31 NOTE — Assessment & Plan Note (Signed)
Acute Bronchitis w/ +UACS  Check cxr   Plan  Patient Instructions  Finish amoxicillin. Begin Delsym 2 teaspoons twice daily as needed for cough. Tessalon recent times daily as needed for cough Zyrtec 10mg  At bedtime   Saline nasal rinses As needed   Begin Prilosec 20mg  daily before meal .  Begin Pepcid 20mg  At bedtime   Chest xray today today  Please contact office for sooner follow up if symptoms do not improve or worsen or seek emergency care  Follow up Dr. Melvyn Novas  In 6 weeks and As needed

## 2015-10-31 NOTE — Addendum Note (Signed)
Addended by: Osa Craver on: 10/31/2015 11:15 AM   Modules accepted: Orders

## 2015-10-31 NOTE — Patient Instructions (Addendum)
Finish amoxicillin. Begin Delsym 2 teaspoons twice daily as needed for cough. Tessalon recent times daily as needed for cough Zyrtec 10mg  At bedtime   Saline nasal rinses As needed   Begin Prilosec 20mg  daily before meal .  Begin Pepcid 20mg  At bedtime   Chest xray today today  Please contact office for sooner follow up if symptoms do not improve or worsen or seek emergency care  Follow up Dr. Melvyn Novas  In 6 weeks and As needed

## 2015-10-31 NOTE — Progress Notes (Signed)
Subjective:    Patient ID: Laura [name], female    DOB: 1945/08/07    MRN: [id number]    Brief patient profile:  70 yobf never smoker seasonal rhinitis spring = fall better since age  71-40s then cough started around 2012 and has waxed and waned ever since so self referred to pulmonary clinic 06/26/2014 for chronic/ recurrent cough having seen [facility] for OSA previously    History of Present Illness  06/26/2014 1st  visit/ [facility]   Chief Complaint  Patient presents with  . Pulmonary Consult    Self referred for cough. Pt c/o dry persistant hacking cough. States it can't last for several minutes before stopping.  chronic cough x at least 2 y prior to OV , happens  2-3 days per week, very violent when coughing/ prev rxn to ? benicar  3-10 sec at a time/ sporadic/ dry more than wet/ day > noct - on prn prilosec 20 mg / water helps, uses cough drops and not better.    Kouffman Reflux v Neurogenic Cough Differentiator Reflux Comments  Do you awaken from a sound sleep coughing violently?                            With trouble breathing? Rarely    Do you have choking episodes when you cannot  Get enough air, gasping for air ?              No    Do you usually cough when you lie down into  The bed, or when you just lie down to rest ?                          no   Do you usually cough after meals or eating?         no   Do you cough when (or after) you bend over?    No    GERD SCORE     Kouffman Reflux v Neurogenic Cough Differentiator Neurogenic   Do you more-or-less cough all day long? sporadic   Does change of temperature make you cough? Not sure    Does laughing or chuckling cause you to cough? No    Do fumes (perfume, automobile fumes, burned  Toast, etc.,) cause you to cough ?      smoke   Does speaking, singing, or talking on the phone cause you to cough   ?               Singing    Neurogenic/Airway score      rec sinus CT> neg Try prilosec 40 mg (2 x 20)   Take 30-60 min before  first meal of the day and Pepcid ac (famotidine) 20 mg one bedtime until cough is completely gone for at least a week without the need for cough suppression GERD diet   07/24/2014 f/u ov/[facility] re: cough x 2012  Chief Complaint  Patient presents with  . Follow-up    Cough has improved some and is non prod. She has noticed some abd pain since starting omeprazole.    Not able to take ppi at the higher dose due to "abd griping" better at the lower dose /  dry, better if drinks water No longer having violent coughs/ much less frequent and only last  A few secs sev times a week./ never noct or related to eating  >PPI  10/31/2015 Acute OV : Cough  Patient presents for an acute office visit. She complains over the last 4 weeks, that she's had cough, congestion, sinus drainage, sore throat and intermittent wheezing. She was seen by her primary care physician on October 11 and given amoxicillin for 10 days and [employer]. She has a lot of drainage in thorat. She feels tired and weak.  Has coughing fits.  Labs on 10/5 with nml cbc w/ diff and bmet. She was in ER on 10/5 for vertigo flare and anxiety .  Under a lot of stress.  Does not take PPI any more .  She was last seen 1 year for chronic cough , treated with PPI and Pepcid .and cough meds.    Has OSA on CPAP . Doing well on this.    Current Medications, Allergies, Complete Past Medical History, Past Surgical History, Family History, and Social History were reviewed in [employer] record.  ROS  The following are not active complaints unless bolded sore throat, dysphagia, dental problems, itching, sneezing,  nasal congestion or excess/ purulent secretions, ear ache,   fever, chills, sweats, unintended wt loss, classically pleuritic or exertional cp, hemoptysis,  orthopnea pnd or leg swelling, presyncope, palpitations, abdominal pain, anorexia, nausea, vomiting, diarrhea  or change in bowel or bladder habits, change in  stools or urine, dysuria,hematuria,  rash, arthralgias, visual complaints, headache, numbness, weakness or ataxia or problems with walking or coordination,  change in mood/affect or memory.                 Objective:   Physical Exam  amb bf nad  , barking cough   07/24/2014        208   Wt Readings from Last 3 Encounters:  06/26/14 180 lb (81.647 kg)  06/13/14 209 lb 2 oz (94.858 kg)  05/02/14 206 lb 2.2 oz (93.504 kg)    Vital signs reviewed  Vitals:   10/31/15 1044  BP: 124/60  Pulse: 79  Temp: 97.8 F (36.6 C)  SpO2: 98%  Weight: 203 lb 9.6 oz (92.4 kg)  Height: 5\' 4"  (1.626 m)     HEENT: nl dentition, turbinates, and orophanx. Nl external ear canals without cough reflex   NECK :  without JVD/Nodes/TM/ nl carotid upstrokes bilaterally   LUNGS: no acc muscle use, clear to A and P bilaterally without cough on insp or exp maneuvers   CV:  RRR  no s3 or murmur or increase in P2, no edema   ABD:  soft and nontender with nl excursion in the supine position. No bruits or organomegaly, bowel sounds nl  MS:  warm without deformities, calf tenderness, cyanosis or clubbing  SKIN: warm and dry without lesions    NEURO:  alert, approp, no deficits       Sinus CT 07/06/14 > neg     Ronin Crager NP-C  [facility]  10/31/2015

## 2015-10-31 NOTE — Progress Notes (Signed)
Chart and office note reviewed in detail along > agree with a/p as outlined  

## 2015-11-01 NOTE — Progress Notes (Signed)
Called spoke with pt. Reviewed results and recs. Pt voiced understanding and had no further questions.

## 2015-11-05 ENCOUNTER — Ambulatory Visit (INDEPENDENT_AMBULATORY_CARE_PROVIDER_SITE_OTHER): Payer: Medicare Other | Admitting: Specialist

## 2015-11-05 ENCOUNTER — Encounter (INDEPENDENT_AMBULATORY_CARE_PROVIDER_SITE_OTHER): Payer: Self-pay | Admitting: Specialist

## 2015-11-05 VITALS — BP 147/83 | HR 70

## 2015-11-05 DIAGNOSIS — M5090 Cervical disc disorder, unspecified, unspecified cervical region: Secondary | ICD-10-CM

## 2015-11-05 DIAGNOSIS — G8929 Other chronic pain: Secondary | ICD-10-CM | POA: Diagnosis not present

## 2015-11-05 DIAGNOSIS — M542 Cervicalgia: Secondary | ICD-10-CM

## 2015-11-05 DIAGNOSIS — M25511 Pain in right shoulder: Secondary | ICD-10-CM

## 2015-11-05 DIAGNOSIS — M25512 Pain in left shoulder: Secondary | ICD-10-CM

## 2015-11-05 MED ORDER — CLONAZEPAM 0.5 MG PO TABS: ORAL_TABLET | ORAL | 0 refills | Status: DC

## 2015-11-05 MED ORDER — IBUPROFEN 200 MG PO TABS: 200.0000 mg | ORAL_TABLET | Freq: Four times a day (QID) | ORAL | 0 refills | Status: DC | PRN

## 2015-11-05 NOTE — Progress Notes (Signed)
Office Visit Note   Patient: Laura [name]           Date of Birth: 06/24/1945           MRN: [id number] Visit Date: 11/05/2015              Requested by: Biagio Borg, MD [address] [place], [place] [address] PCP: Cathlean Cower, MD   Assessment & Plan: Visit Diagnoses:  1. Chronic pain of both shoulders   2. Cervicalgia   3. Cervical disc disease     Plan: Avoid overhead use of arms and overhead lifting No lifting over 10 lbs. Avoid pushing off of chair and table to decrease stress on shoulder. Ice at the end of the day and heat and stretching excercises for the shoulder in AM. TOC motrin or advil for discomfort MRI without contrast right shoulder Take klonopin 1 tablet 0.5 mg prior to MRI for anxiety associated with having the study. If MRI verifies arthrosis and lack of rotator cuff pathology then I may refer you for consideration of a shoulder replacement surgery.   Follow-Up Instructions: Return in about 2 weeks (around 11/19/2015) for Review of MRI, .   Orders:  Orders Placed This Encounter  Procedures  . MR SHOULDER RIGHT WO CONTRAST   Meds ordered this encounter  Medications  . clonazePAM (KLONOPIN) 0.5 MG tablet    Sig: Take 1/2 - 1 tablet po qhs prn anxiety    Dispense:  30 tablet    Refill:  0      Procedures: No procedures performed   Clinical Data: Findings:  Xrays from previous visit demonstrate right inferomedial osteophyte, no significant joint narrowing on AP and Axillary lateral views.    Subjective: Chief Complaint  Patient presents with  . Neck - Follow-up    Not much pain. Aches. 3out of 10 pain level  . Left Shoulder - Follow-up    Pain every now and then. Rt shoulder>Left shoulder  . Right Shoulder - Follow-up    Injection given at last ov, states no help. Muscle spasms on/off. Trouble at times raising arm. Doing home exercises.     HPI 70 year old female being see with history of neck and right greater than left shoulder  pain.Using ice and heat, 3 episodes in the last one week. Pain in both shoulders is an intermitant aching, more recently with muscle spasm right shoulder.No numbness, no paresthesias. Just pain right shoulder and into the lateral right arm above the right elbow.Can't reach up to comb her hair.Neck does not hurt now. No pain with ROM neck.Most pain is at night and hard to get comfortable. Using 2-3 pillows, painful to turn over on her shoulder. Better when she exercises. Injection right shoulder last visit without help.  Review of Systems  Constitutional: Positive for chills, diaphoresis, fatigue and fever.  HENT: Positive for congestion, postnasal drip, rhinorrhea and sneezing.   Respiratory: Positive for cough, choking, chest tightness and shortness of breath.   Cardiovascular: Positive for chest pain. Negative for palpitations and leg swelling.  Gastrointestinal: Negative.  Negative for abdominal distention, abdominal pain, anal bleeding, blood in stool, constipation and diarrhea.  Endocrine: Negative.   Genitourinary: Negative.   Musculoskeletal: Positive for joint swelling. Negative for neck stiffness.  Skin: Negative.   Allergic/Immunologic: Negative.   Neurological: Negative.   Hematological: Negative.   Psychiatric/Behavioral: Negative.      Objective: Vital Signs: BP (!) 147/83   Pulse 70  Physical Exam  Constitutional: She is oriented to person, place, and time. She appears well-developed and well-nourished.  HENT:  Head: Normocephalic and atraumatic.  Eyes: EOM are normal. Right eye exhibits no discharge. Left eye exhibits no discharge.  Neck: Neck supple. No JVD present. No tracheal deviation present. No thyromegaly present.  Pulmonary/Chest: She has no wheezes.  Abdominal: Soft. She exhibits no distension and no mass. There is no tenderness. There is no rebound and no guarding. No hernia.  Musculoskeletal: She exhibits tenderness.  Neurological: She is alert and oriented  to person, place, and time. She has normal reflexes. She displays normal reflexes. No cranial nerve deficit. She exhibits normal muscle tone. Coordination normal.  Skin: Skin is warm and dry.  Psychiatric: She has a normal mood and affect. Her behavior is normal. Judgment and thought content normal.    Right Shoulder Exam   Range of Motion  Active Abduction: 160  Passive Abduction: 160  Extension: 30  Forward Flexion: 60  External Rotation: 70   Muscle Strength  Abduction: 5/5  Internal Rotation: 5/5  External Rotation: 5/5  Supraspinatus: 5/5  Subscapularis: 5/5  Biceps: 5/5   Tests  Apprehension: positive Cross Arm: negative Drop Arm: negative Hawkin's test: negative Impingement: negative Sulcus: absent  Other  Erythema: absent Scars: absent Sensation: normal Pulse: present  Comments:  Right shoulder with pain at extremes of motion    Left Shoulder Exam   Range of Motion  Active Abduction:  100 abnormal  Passive Abduction: 120  Extension: 20  Forward Flexion: 90  External Rotation: 60   Muscle Strength  Abduction: 5/5  Internal Rotation: 5/5  External Rotation: 5/5  Supraspinatus: 5/5  Subscapularis: 5/5  Biceps: 5/5   Tests  Apprehension: negative Cross Arm: negative Drop Arm: negative Hawkin's test: negative Impingement: negative Sulcus: absent  Other  Erythema: absent Scars: absent Sensation: normal Pulse: present   Comments:  Left shoulder with pain at ends of ROM, restricted ROM all directions.      Specialty Comments:  No specialty comments available.  Imaging: No results found.   PMFS History: Patient Active Problem List   Diagnosis Date Noted  . Acute bronchitis 10/31/2015  . Urinary frequency 03/09/2015  . Dizziness and giddiness 08/09/2014  . Morbid obesity ([place]) 07/25/2014  . Orthostatic hypotension 07/07/2014  . Upper airway cough syndrome 06/26/2014  . Diarrhea 05/02/2014  . Dizziness 04/12/2014  . Neuropathy  ([place])   . Rash and nonspecific skin eruption 08/30/2013  . Paresthesia of both feet 08/30/2013  . Blurred vision, bilateral 02/15/2013  . Eye pain 02/15/2013  . Sinusitis, chronic 10/07/2012  . Left shoulder pain 04/15/2012  . Encounter for preventative adult health care exam with abnormal findings 10/22/2011  . Cervical disc disease 10/22/2011  . Bilateral hand pain 08/05/2011  . Reflux 06/29/2011  . Bloating 06/29/2011  . Back pain 03/13/2011  . Vertigo 01/04/2011  . Nystagmus 01/04/2011  . Insomnia 10/21/2010  . CONSTIPATION 08/17/2009  . VITAMIN D DEFICIENCY 05/15/2009  . DYSPNEA 03/13/2009  . Essential hypertension, benign 01/30/2009  . DEPRESSION 01/16/2009  . Irritable bowel syndrome 01/16/2009  . OSTEOARTHRITIS 12/15/2008  . Diabetes ([place]) 11/03/2008  . Hyperlipidemia 11/03/2008  . Anxiety state 11/03/2008  . OVERACTIVE BLADDER 11/03/2008  . OSA (obstructive sleep apnea) 11/03/2008  . MURMUR 11/03/2008   Past Medical History:  Diagnosis Date  . Anxiety   . Arthritis    back of neck, bones spurs on neck  . Cervical disc disease   .  Diabetes mellitus   . Hyperlipidemia   . Hypertension   . Neuropathy ([place])   . Reflux   . Sleep apnea    wears CPAP occasionally  . Vertigo     Family History  Problem Relation Age of Onset  . Hypertension Other   . Diabetes Father   . Diabetes Sister   . Diabetes Maternal Aunt   . Colon cancer Neg Hx   . Esophageal cancer Neg Hx   . Stomach cancer Neg Hx   . Rectal cancer Neg Hx     Past Surgical History:  Procedure Laterality Date  . BLADDER SURGERY    . BREAST SURGERY    . CHOLECYSTECTOMY    . fibroids removed     breast (both breasts)  . PARTIAL HYSTERECTOMY     Social History   Occupational History  . 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 Smoking status: Never Smoker  . Smokeless tobacco: Never Used  . Alcohol use No  . Drug use: No  . Sexual activity: Not Currently

## 2015-11-05 NOTE — Addendum Note (Signed)
Addended by: Basil Dess on: 11/05/2015 03:29 PM   Modules accepted: Orders

## 2015-11-05 NOTE — Patient Instructions (Addendum)
Avoid overhead use of arms and overhead lifting No lifting over 10 lbs. Avoid pushing off of chair and table to decrease stress on shoulder. Ice at the end of the day and heat and stretching excercises for the shoulder in AM. TOC motrin or advil for discomfort MRI right shoulder without contrast  Take klonopin 1 tablet 0.5 mg prior to MRI for anxiety associated with having the study. If MRI verifies arthrosis and lack of rotator cuff pathology then I may refer you for consideration of a shoulder replacement surgery.

## 2015-11-16 NOTE — Addendum Note (Signed)
Addended by: Daylene Posey T on: 11/16/2015 10:11 AM   Modules accepted: Orders

## 2015-12-12 ENCOUNTER — Ambulatory Visit (INDEPENDENT_AMBULATORY_CARE_PROVIDER_SITE_OTHER): Payer: Medicare Other | Admitting: Internal Medicine

## 2015-12-12 ENCOUNTER — Encounter: Payer: Self-pay | Admitting: Internal Medicine

## 2015-12-12 VITALS — BP 140/80 | HR 90 | Temp 98.2°F | Resp 20 | Wt 203.0 lb

## 2015-12-12 DIAGNOSIS — Z23 Encounter for immunization: Secondary | ICD-10-CM | POA: Diagnosis not present

## 2015-12-12 DIAGNOSIS — F411 Generalized anxiety disorder: Secondary | ICD-10-CM

## 2015-12-12 DIAGNOSIS — R5383 Other fatigue: Secondary | ICD-10-CM | POA: Diagnosis not present

## 2015-12-12 DIAGNOSIS — I10 Essential (primary) hypertension: Secondary | ICD-10-CM

## 2015-12-12 DIAGNOSIS — E1140 Type 2 diabetes mellitus with diabetic neuropathy, unspecified: Secondary | ICD-10-CM

## 2015-12-12 MED ORDER — HYDROXYZINE HCL 10 MG PO TABS: 10.0000 mg | ORAL_TABLET | Freq: Three times a day (TID) | ORAL | 2 refills | Status: DC | PRN

## 2015-12-12 MED ORDER — GLIMEPIRIDE 2 MG PO TABS: 1.0000 mg | ORAL_TABLET | Freq: Every day | ORAL | 3 refills | Status: DC

## 2015-12-12 NOTE — Progress Notes (Signed)
Subjective:    Patient ID: Laura [name], female    DOB: [birth year], 70 y.o.   MRN: [id number]  HPI  "Im doing everything except falling down" Feeling scared, has had several anxiety attacks recently. Denies worsening depressive symptoms, suicidal ideation, or panic; has ongoing anxiety.  Pt denies chest pain, increased sob or doe, wheezing, orthopnea, PND, increased LE swelling, palpitations, or syncope.   Also since thanksgiving getting weaker, fatigued, intermitttent diarrhea, dizzy/lightheaded and sugars not doing as well controlled, and jittery. Denies urinary symptoms such as dysuria, frequency, urgency, flank pain, hematuria or n/v, fever, chills.  cbg's have been in 200's but was once 120, not taking the glimeparide thinking it might be sugar.too low causing symptoms, wants to go back to 1/2 tab Past Medical History:  Diagnosis Date  . Anxiety   . Arthritis    back of neck, bones spurs on neck  . Cervical disc disease   . Diabetes mellitus   . Hyperlipidemia   . Hypertension   . Neuropathy ([place])   . Reflux   . Sleep apnea    wears CPAP occasionally  . Vertigo    Past Surgical History:  Procedure Laterality Date  . BLADDER SURGERY    . BREAST SURGERY    . CHOLECYSTECTOMY    . fibroids removed     breast (both breasts)  . PARTIAL HYSTERECTOMY      reports that she has never smoked. She has never used smokeless tobacco. She reports that she does not drink alcohol or use drugs. family history includes Diabetes in her father, maternal aunt, and sister; Hypertension in her other. Allergies  Allergen Reactions  . Sulfa Antibiotics     Tongue swells, hives, itching  . Codeine     itch  . Crestor [Rosuvastatin Calcium] Other (See Comments)    Did something to memory   . Hydrocodone-Homatropine Other (See Comments)    Vertigo *pt strongly prefers to never take*  . Keflex [Cephalexin] Diarrhea and Nausea And Vomiting  . Lipitor [Atorvastatin Calcium] Other (See Comments)     Makes weak   . Prednisone Other (See Comments)    Nervous *pt strongly prefers to never be given prednisone*    Current Outpatient Prescriptions on File Prior to Visit  Medication Sig Dispense Refill  . acetaminophen (TYLENOL) 325 MG tablet Take 325 mg by mouth as needed for mild pain, moderate pain, fever or headache.     Marland Kitchen amLODipine (NORVASC) 5 MG tablet TAKE ONE (1) TABLET BY MOUTH EVERY DAY 90 tablet 3  . Blood Glucose Monitoring Suppl (ONE TOUCH ULTRA 2) w/Device KIT Use as directed 1 each 0  . clonazePAM (KLONOPIN) 0.5 MG tablet Take 1/2 - 1 tablet po qhs prn anxiety 30 tablet 0  . dextromethorphan (DELSYM) 30 MG/5ML liquid Take 15 mg by mouth 2 (two) times daily.    Marland Kitchen ibuprofen (ADVIL) 200 MG tablet Take 1 tablet (200 mg total) by mouth every 6 (six) hours as needed. 30 tablet 0  . Lancets (ONETOUCH ULTRASOFT) lancets USE FOR CHECKING BLOOD SUGARS TWICE DAILY 100 each 3  . loperamide (IMODIUM A-D) 2 MG tablet Take 2 mg by mouth daily as needed for diarrhea or loose stools.     Marland Kitchen LORazepam (ATIVAN) 0.5 MG tablet TAKE ONE (1) TABLET BY MOUTH EVERY DAY AS NEEDED FOR ANXIETY 30 tablet 2  . meclizine (ANTIVERT) 12.5 MG tablet Take 1 tablet (12.5 mg total) by mouth 3 (three) times daily as needed  for dizziness. 30 tablet 1  . omeprazole (PRILOSEC) 20 MG capsule TAKE ONE CAPSULE BY MOUTH DAILY 90 capsule 1  . ONE TOUCH ULTRA TEST test strip USE TO CHECK BLOOD SUGARS DAILY 50 each 0  . OVER THE COUNTER MEDICATION Apply 1 application topically daily as needed (pain).    . valsartan (DIOVAN) 160 MG tablet Take 1 tablet (160 mg total) by mouth daily. 90 tablet 3   No current facility-administered medications on file prior to visit.     Review of Systems  Constitutional: Negative for unusual diaphoresis or night sweats HENT: Negative for ear swelling or discharge Eyes: Negative for worsening visual haziness  Respiratory: Negative for choking and stridor.   Gastrointestinal: Negative  for distension or worsening eructation Genitourinary: Negative for retention or change in urine volume.  Musculoskeletal: Negative for other MSK pain or swelling Skin: Negative for color change and worsening wound Neurological: Negative for tremors and numbness other than noted  Psychiatric/Behavioral: Negative for decreased concentration or agitation other than above   All other system neg per pt    Objective:   Physical Exam BP 140/80   Pulse 90   Temp 98.2 F (36.8 C) (Oral)   Resp 20   Wt 203 lb (92.1 kg)   SpO2 98%   BMI 34.84 kg/m  VS noted,  Constitutional: Pt appears in no apparent distress HENT: Head: NCAT.  Right Ear: External ear normal.  Left Ear: External ear normal.  Eyes: . Pupils are equal, round, and reactive to light. Conjunctivae and EOM are normal Neck: Normal range of motion. Neck supple.  Cardiovascular: Normal rate and regular rhythm.   Pulmonary/Chest: Effort normal and breath sounds without rales or wheezing.  Abd:  Soft, NT, ND, + BS Neurological: Pt is alert. Not confused , motor grossly intact Skin: Skin is warm. No rash, no LE edema Psychiatric: Pt behavior is normal. No agitation. 2+ nervous  Lab Results  Component Value Date   WBC 7.7 10/18/2015   HGB 13.0 10/18/2015   HCT 38.3 10/18/2015   PLT 262 10/18/2015   GLUCOSE 115 (H) 10/18/2015   CHOL 258 (H) 04/12/2014   TRIG 196.0 (H) 04/12/2014   HDL 53.20 04/12/2014   LDLDIRECT 168.3 02/17/2013   LDLCALC 166 (H) 04/12/2014   ALT 19 06/20/2015   AST 16 06/20/2015   NA 139 10/18/2015   K 4.2 10/18/2015   CL 107 10/18/2015   CREATININE 0.73 10/18/2015   BUN 12 10/18/2015   CO2 26 10/18/2015   TSH 1.87 06/20/2015   HGBA1C 7.5 (H) 06/20/2015   MICROALBUR 0.9 04/12/2014      Assessment & Plan:

## 2015-12-12 NOTE — Progress Notes (Signed)
Pre visit review using our clinic review tool, if applicable. No additional management support is needed unless otherwise documented below in the visit note. 

## 2015-12-12 NOTE — Patient Instructions (Addendum)
You had the flu shot today  OK to decrease the glimeparide to 1 mg per day (which is half of the 2 mg)  Please take all new medication as prescribed - the atarax low dose as needed for nerves  Please continue all other medications as before, and refills have been done if requested.  Please have the pharmacy call with any other refills you may need.  Please continue your efforts at being more active, low cholesterol diabetic diet, and weight control.  Please keep your appointments with your specialists as you may have planned

## 2015-12-16 DIAGNOSIS — R5383 Other fatigue: Secondary | ICD-10-CM | POA: Insufficient documentation

## 2015-12-16 NOTE — Assessment & Plan Note (Signed)
stable overall by history and exam, recent data reviewed with pt, and pt to continue medical treatment as before,  to f/u any worsening symptoms or concerns BP Readings from Last 3 Encounters:  12/12/15 140/80  11/05/15 (!) 147/83  10/31/15 124/60

## 2015-12-16 NOTE — Assessment & Plan Note (Signed)
Ok for reduced glimeparide to half tab, though needs to monitor cbg/s, call for > 200

## 2015-12-16 NOTE — Assessment & Plan Note (Signed)
Etiology unclear, exam benign, declines further labs, to f/u any worsening symptoms or concerns

## 2015-12-16 NOTE — Assessment & Plan Note (Signed)
At least mild worsening, Ok to add atarax prn,  to f/u any worsening symptoms or concerns

## 2015-12-19 ENCOUNTER — Other Ambulatory Visit: Payer: Self-pay | Admitting: Internal Medicine

## 2015-12-19 ENCOUNTER — Ambulatory Visit (INDEPENDENT_AMBULATORY_CARE_PROVIDER_SITE_OTHER): Payer: Self-pay | Admitting: Specialist

## 2015-12-24 ENCOUNTER — Other Ambulatory Visit: Payer: Self-pay | Admitting: Internal Medicine

## 2016-01-01 ENCOUNTER — Other Ambulatory Visit: Payer: Self-pay | Admitting: Internal Medicine

## 2016-01-04 ENCOUNTER — Other Ambulatory Visit: Payer: Self-pay | Admitting: Internal Medicine

## 2016-01-21 ENCOUNTER — Other Ambulatory Visit: Payer: Self-pay | Admitting: Internal Medicine

## 2016-02-05 DIAGNOSIS — G4733 Obstructive sleep apnea (adult) (pediatric): Secondary | ICD-10-CM | POA: Diagnosis not present

## 2016-03-20 ENCOUNTER — Ambulatory Visit (INDEPENDENT_AMBULATORY_CARE_PROVIDER_SITE_OTHER): Payer: Medicare Other | Admitting: Orthopedic Surgery

## 2016-03-20 DIAGNOSIS — M7502 Adhesive capsulitis of left shoulder: Secondary | ICD-10-CM | POA: Diagnosis not present

## 2016-03-20 MED ORDER — METHYLPREDNISOLONE ACETATE 40 MG/ML IJ SUSP
40.0000 mg | INTRAMUSCULAR | Status: AC | PRN
  Administered 2016-03-20: 40 mg via INTRA_ARTICULAR

## 2016-03-20 MED ORDER — LIDOCAINE HCL 1 % IJ SOLN
5.0000 mL | INTRAMUSCULAR | Status: AC | PRN
  Administered 2016-03-20: 5 mL

## 2016-03-20 NOTE — Progress Notes (Signed)
Office Visit Note   Patient: Laura [name]           Date of Birth: [birth year]           MRN: [id number] Visit Date: 03/20/2016              Requested by: Biagio Borg, MD [place],  [id number] PCP: Cathlean Cower, MD   Assessment & Plan: Visit Diagnoses:  1. Adhesive capsulitis of left shoulder     Plan: Left shoulder was injected in the subacromial space. Plan to follow-up in 3 weeks. Discussed that she is still symptomatic she may benefit from arthroscopic debridement of the adhesive capsulitis of the left shoulder.  Follow-Up Instructions: Return in about 3 weeks (around 04/10/2016).   Orders:  Orders Placed This Encounter  Procedures  . Large Joint Injection/Arthrocentesis   No orders of the defined types were placed in this encounter.     Procedures: Large Joint Inj Date/Time: 03/20/2016 12:53 PM Performed by: DUDA, MARCUS V Authorized by: Newt Minion   Consent Given by:  Patient Site marked: the procedure site was marked   Timeout: prior to procedure the correct patient, procedure, and site was verified   Indications:  Pain and diagnostic evaluation Location:  Shoulder Site:  L subacromial bursa Prep: patient was prepped and draped in usual sterile fashion   Needle Size:  22 G Needle Length:  1.5 inches Approach:  Posterior Ultrasound Guidance: No   Fluoroscopic Guidance: No   Arthrogram: No   Medications:  5 mL lidocaine 1 %; 40 mg methylPREDNISolone acetate 40 MG/ML Aspiration Attempted: No   Patient tolerance:  Patient tolerated the procedure well with no immediate complications     Clinical Data: No additional findings.   Subjective: Chief Complaint  Patient presents with  . Neck - Pain  . Left Shoulder - Pain  . Right Shoulder - Pain    Patient is here today for constant neck pain and bilateral shoulder pain.  She has been having pain for 2 weeks with no relief, she has not had any fall or injury.  Neck pain is a  constant ache with some relief after using heat.  Patient states the left shoulder has the worst constant pain, mainly in shoulder joint with difficulty elevating shoulder.  She does complain of having muscle spasms in both right and left shoulder.  She has been taking Tylenol.    Review of Systems   Objective: Vital Signs: There were no vitals taken for this visit.  Physical Exam on examination patient is alert oriented no adenopathy well-dressed normal affect normal respiratory effort she has a normal gait. Examination she has abduction and flexion of only about 120 bilaterally. She has glenohumeral motion left of 70. Internal and external rotation of only 20 for the left shoulder. She has no pain to palpation over the biceps tendon.  Ortho Exam  Complete review of systems negative except as mentioned in the history of present illness.  Specialty Comments:  No specialty comments available.  Imaging: No results found.   PMFS History: Patient Active Problem List   Diagnosis Date Noted  . Adhesive capsulitis of left shoulder 03/20/2016  . Fatigue 12/16/2015  . Acute bronchitis 10/31/2015  . Urinary frequency 03/09/2015  . Dizziness and giddiness 08/09/2014  . Morbid obesity ([place]) 07/25/2014  . Orthostatic hypotension 07/07/2014  . Upper airway cough syndrome 06/26/2014  . Diarrhea 05/02/2014  . Dizziness 04/12/2014  .  Neuropathy ([place])   . Rash and nonspecific skin eruption 08/30/2013  . Paresthesia of both feet 08/30/2013  . Blurred vision, bilateral 02/15/2013  . Eye pain 02/15/2013  . Sinusitis, chronic 10/07/2012  . Left shoulder pain 04/15/2012  . Encounter for preventative adult health care exam with abnormal findings 10/22/2011  . Cervical disc disease 10/22/2011  . Bilateral hand pain 08/05/2011  . Reflux 06/29/2011  . Bloating 06/29/2011  . Back pain 03/13/2011  . Vertigo 01/04/2011  . Nystagmus 01/04/2011  . Insomnia 10/21/2010  . CONSTIPATION 08/17/2009    . VITAMIN D DEFICIENCY 05/15/2009  . DYSPNEA 03/13/2009  . Essential hypertension, benign 01/30/2009  . DEPRESSION 01/16/2009  . Irritable bowel syndrome 01/16/2009  . OSTEOARTHRITIS 12/15/2008  . Diabetes ([place]) 11/03/2008  . Hyperlipidemia 11/03/2008  . Anxiety state 11/03/2008  . OVERACTIVE BLADDER 11/03/2008  . OSA (obstructive sleep apnea) 11/03/2008  . MURMUR 11/03/2008   Past Medical History:  Diagnosis Date  . Anxiety   . Arthritis    back of neck, bones spurs on neck  . Cervical disc disease   . Diabetes mellitus   . Hyperlipidemia   . Hypertension   . Neuropathy ([place])   . Reflux   . Sleep apnea    wears CPAP occasionally  . Vertigo     Family History  Problem Relation Age of Onset  . Hypertension Other   . Diabetes Father   . Diabetes Sister   . Diabetes Maternal Aunt   . Colon cancer Neg Hx   . Esophageal cancer Neg Hx   . Stomach cancer Neg Hx   . Rectal cancer Neg Hx     Past Surgical History:  Procedure Laterality Date  . BLADDER SURGERY    . BREAST SURGERY    . CHOLECYSTECTOMY    . fibroids removed     breast (both breasts)  . PARTIAL HYSTERECTOMY     Social History   Occupational History  . 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 Smoking status: Never Smoker  . Smokeless tobacco: Never Used  . Alcohol use No  . Drug use: No  . Sexual activity: Not Currently

## 2016-03-24 ENCOUNTER — Ambulatory Visit (INDEPENDENT_AMBULATORY_CARE_PROVIDER_SITE_OTHER): Payer: Self-pay | Admitting: Orthopaedic Surgery

## 2016-03-26 ENCOUNTER — Other Ambulatory Visit (INDEPENDENT_AMBULATORY_CARE_PROVIDER_SITE_OTHER): Payer: Self-pay | Admitting: Family

## 2016-03-26 ENCOUNTER — Telehealth (INDEPENDENT_AMBULATORY_CARE_PROVIDER_SITE_OTHER): Payer: Self-pay | Admitting: Orthopedic Surgery

## 2016-03-26 MED ORDER — METHOCARBAMOL 500 MG PO TABS: 500.0000 mg | ORAL_TABLET | Freq: Three times a day (TID) | ORAL | 0 refills | Status: DC

## 2016-03-26 MED ORDER — METHOCARBAMOL 500 MG PO TABS
500.0000 mg | ORAL_TABLET | Freq: Four times a day (QID) | ORAL | 0 refills | Status: DC
Start: 2016-03-26 — End: 2016-06-16

## 2016-03-26 NOTE — Progress Notes (Deleted)
robax

## 2016-03-26 NOTE — Telephone Encounter (Signed)
Given to pt while in office. Also checked blood pressure per request 158/82 she states that she has been feeling " off" for the past week advised pt to call her PCP and make appt for evaluation.

## 2016-03-26 NOTE — Telephone Encounter (Signed)
Patient called asked if she can get a muscle relaxer for her left shoulder. Patient said she has been having muscle spasm since Friday. Patient said she is in a lot of pain. Patient said she have been using hot and cold on her shoulder. Patient said she uses the pharmacy [facility]   Ph# [phone]  The number to contact patient is [phone]

## 2016-03-26 NOTE — Telephone Encounter (Signed)
Robaxin rx printed

## 2016-03-26 NOTE — Telephone Encounter (Signed)
adhesive capsulitis of the left shoulder. S/p injection last week. Pt is complaining of muscle spasm and is requesting rx for this. Please advise.

## 2016-03-31 ENCOUNTER — Encounter (INDEPENDENT_AMBULATORY_CARE_PROVIDER_SITE_OTHER): Payer: Self-pay | Admitting: Radiology

## 2016-03-31 NOTE — Progress Notes (Signed)
Patient was in office to pickup a prescription on Wed. 03/26/16.  She was upset that no one called her after she left several messages.  She was confused about the Rx itself, she thought they gave her the wrong Rx, she thought it was an antibiotic.  I advised her it was a muscle relaxer, and was what she asked for.  She understands now.  She was also off balance, and said she did not feel well.  Her BP was checked earlier that day as well, and documented.  Patient was again advised to call her PCP for further workup on the elevated BP and that she did not feel well.

## 2016-04-15 ENCOUNTER — Telehealth (INDEPENDENT_AMBULATORY_CARE_PROVIDER_SITE_OTHER): Payer: Self-pay | Admitting: Specialist

## 2016-04-15 NOTE — Telephone Encounter (Signed)
Patient called asked if Dr Louanne Skye can refer her to (PT) for her neck and shoulder. The number to contact patient is [phone]

## 2016-04-15 NOTE — Telephone Encounter (Signed)
Patient called asked if Dr Louanne Skye can refer her to (PT) for her neck and shoulder. The number to contact patient is [phone]

## 2016-05-05 NOTE — Telephone Encounter (Signed)
Needs an eval before PT can be initiated, jen

## 2016-05-06 NOTE — Telephone Encounter (Signed)
Pt advised and she has an appt on Thursday and will see him then.

## 2016-05-08 ENCOUNTER — Encounter (INDEPENDENT_AMBULATORY_CARE_PROVIDER_SITE_OTHER): Payer: Self-pay | Admitting: Specialist

## 2016-05-08 ENCOUNTER — Ambulatory Visit (INDEPENDENT_AMBULATORY_CARE_PROVIDER_SITE_OTHER): Payer: Medicare Other | Admitting: Specialist

## 2016-05-08 ENCOUNTER — Ambulatory Visit (INDEPENDENT_AMBULATORY_CARE_PROVIDER_SITE_OTHER): Payer: Medicare Other

## 2016-05-08 VITALS — BP 128/73 | HR 71 | Ht 64.0 in | Wt 198.0 lb

## 2016-05-08 DIAGNOSIS — M7502 Adhesive capsulitis of left shoulder: Secondary | ICD-10-CM

## 2016-05-08 DIAGNOSIS — M542 Cervicalgia: Secondary | ICD-10-CM

## 2016-05-08 DIAGNOSIS — M4712 Other spondylosis with myelopathy, cervical region: Secondary | ICD-10-CM | POA: Diagnosis not present

## 2016-05-08 DIAGNOSIS — M67441 Ganglion, right hand: Secondary | ICD-10-CM

## 2016-05-08 MED ORDER — NAPROXEN 500 MG PO TABS: 500.0000 mg | ORAL_TABLET | Freq: Two times a day (BID) | ORAL | 2 refills | Status: DC

## 2016-05-08 MED ORDER — TRAMADOL-ACETAMINOPHEN 37.5-325 MG PO TABS: 1.0000 | ORAL_TABLET | ORAL | 0 refills | Status: DC | PRN

## 2016-05-08 MED ORDER — CLONAZEPAM 0.5 MG PO TABS: ORAL_TABLET | ORAL | 0 refills | Status: DC

## 2016-05-08 NOTE — Progress Notes (Signed)
Office Visit Note   Patient: Laura [name]           Date of Birth: [birth year]           MRN: [id number] Visit Date: 05/08/2016              Requested by: Biagio Borg, MD [place], [place] [id number] PCP: Cathlean Cower, MD   Assessment & Plan: Visit Diagnoses:  1. Neck pain   2. Other spondylosis with myelopathy, cervical region   3. Adhesive capsulitis of left shoulder   4. Digital mucinous cyst of finger of right hand     Plan: Avoid overhead lifting and overhead use of the arms.  No lifting greater than 10 lbs. May use ice or moist heat for pain. Start naprosyn or alleve for arthritis pain and shoulder pain, then ultracet if needed for pain not relieved by the naprosyn.  Go to PT in [place] with [facility]) for eval and treatment of the left shoulder frozen shoulder and cervical  Spine arthritis. Make appt with Dr. Fredna Dow for treatment of mucinous cyst right thumb IP joint.    Follow-Up Instructions: No Follow-up on file.   Orders:  Orders Placed This Encounter  Procedures  . XR Cervical Spine 2 or 3 views  . Ambulatory referral to Physical Therapy   Meds ordered this encounter  Medications  . clonazePAM (KLONOPIN) 0.5 MG tablet    Sig: Take 1/2 - 1 tablet po qhs prn anxiety    Dispense:  15 tablet    Refill:  0  . traMADol-acetaminophen (ULTRACET) 37.5-325 MG tablet    Sig: Take 1 tablet by mouth every 4 (four) hours as needed for moderate pain.    Dispense:  30 tablet    Refill:  0      Procedures: No procedures performed   Clinical Data: No additional findings.   Subjective: No chief complaint on file.   71 year old female with pain in her neck and into her shoulders worsened since early part of February, had a cortisone shot injection by Dr. Sharol Given without relief.  called with worsening pain into the shoulders left greater than right then post muscle relaxers the pain began into the right shoulder. Also  start with pain into her jaws with muscle spasms in her jaw areas bilateral. Complains of pain level is an 8 or 9. Using the muscle relaxers and moving the left elbow and shoulder helps, heating pad and ice packs help. In the past PT was used and helped. No numbness or tingling. Moving the left arm she feels the pain over the posterior left posterior shoulder deltoid area. She uses virgin olive oil and this helped. Lenox Ahr been using a transdermal medication and it helps to relieve the pain. Some SOB with stairs,  Jaw pain jumps out coming around on both sides. This was a mild pain like muscle spasm. Bowel and bladder function is with weaker flow urine and constipation. Has Type II diabetes.     Review of Systems  Constitutional: Negative.   HENT: Negative.   Eyes: Negative.   Respiratory: Negative.   Cardiovascular: Negative.   Gastrointestinal: Negative.   Endocrine: Negative.   Genitourinary: Negative.   Musculoskeletal: Negative.   Skin: Negative.   Allergic/Immunologic: Negative.   Neurological: Negative.   Hematological: Negative.   Psychiatric/Behavioral: Negative.      Objective: Vital Signs: BP 128/73 (BP Location: Left Arm, Patient Position: Sitting,  Cuff Size: Normal)   Pulse 71   Ht 5\' 4"  (1.626 m)   Wt 198 lb (89.8 kg)   BMI 33.99 kg/m   Physical Exam  Constitutional: She is oriented to person, place, and time. She appears well-developed and well-nourished.  HENT:  Head: Normocephalic and atraumatic.  Eyes: EOM are normal. Pupils are equal, round, and reactive to light.  Neck: Normal range of motion. Neck supple.  Pulmonary/Chest: Effort normal and breath sounds normal.  Abdominal: Soft. Bowel sounds are normal.  Neurological: She is alert and oriented to person, place, and time.  Skin: Skin is warm and dry.  Psychiatric: She has a normal mood and affect. Her behavior is normal. Judgment and thought content normal.    Back Exam   Tenderness  The patient is  experiencing tenderness in the cervical.  Range of Motion  Extension:  60 abnormal  Flexion:  80 normal  Lateral Bend Right: normal  Lateral Bend Left: abnormal  Rotation Right: normal  Rotation Left: abnormal   Muscle Strength  Right Quadriceps:  5/5  Left Quadriceps:  5/5  Right Hamstrings:  5/5  Left Hamstrings:  5/5   Tests  Straight leg raise right: negative Straight leg raise left: negative  Reflexes  Biceps:  2/4 normal Babinski's sign: normal   Other  Toe Walk: normal Heel Walk: normal Sensation: normal Gait: normal  Erythema: no back redness Scars: absent  Comments:  Hoffman's sign negative, Motor in the arms with 5-/5 strength EDC bilateral, 5-/5 left triceps weakness, reflexes are symmetric biceps and triceps and BR.     Right Shoulder Exam   Tenderness  The patient is experiencing no tenderness.    Range of Motion  Active Abduction: normal  Passive Abduction: normal  Extension: normal  Forward Flexion: normal  External Rotation: normal  Internal Rotation 0 degrees:  L3 abnormal  Internal Rotation 90 degrees:  90 normal   Muscle Strength  Abduction: 5/5  Internal Rotation: 5/5  External Rotation: 5/5  Supraspinatus: 5/5  Subscapularis: 5/5  Biceps: 5/5    Left Shoulder Exam   Tenderness  The patient is experiencing tenderness in the acromion.  Range of Motion  Active Abduction:  120 abnormal  Passive Abduction:  120 abnormal  Extension: normal  Forward Flexion: 140  External Rotation:  60 abnormal  Internal Rotation 0 degrees: L3  Internal Rotation 90 degrees: 60   Muscle Strength  Abduction: 4/5  Internal Rotation: 5/5  External Rotation: 4/5  Supraspinatus: 4/5  Subscapularis: 5/5  Biceps: 5/5   Tests  Apprehension: positive Cross Arm: positive Drop Arm: negative Impingement: positive  Other  Erythema: absent Scars: absent Sensation: normal Pulse: present       Specialty Comments:  No specialty comments  available.  Imaging: No results found.   PMFS History: Patient Active Problem List   Diagnosis Date Noted  . Adhesive capsulitis of left shoulder 03/20/2016  . Fatigue 12/16/2015  . Acute bronchitis 10/31/2015  . Urinary frequency 03/09/2015  . Dizziness and giddiness 08/09/2014  . Morbid obesity ([place]) 07/25/2014  . Orthostatic hypotension 07/07/2014  . Upper airway cough syndrome 06/26/2014  . Diarrhea 05/02/2014  . Dizziness 04/12/2014  . Neuropathy   . Rash and nonspecific skin eruption 08/30/2013  . Paresthesia of both feet 08/30/2013  . Blurred vision, bilateral 02/15/2013  . Eye pain 02/15/2013  . Sinusitis, chronic 10/07/2012  . Left shoulder pain 04/15/2012  . Encounter for preventative adult health care exam with abnormal findings  10/22/2011  . Cervical disc disease 10/22/2011  . Bilateral hand pain 08/05/2011  . Reflux 06/29/2011  . Bloating 06/29/2011  . Back pain 03/13/2011  . Vertigo 01/04/2011  . Nystagmus 01/04/2011  . Insomnia 10/21/2010  . CONSTIPATION 08/17/2009  . VITAMIN D DEFICIENCY 05/15/2009  . DYSPNEA 03/13/2009  . Essential hypertension, benign 01/30/2009  . DEPRESSION 01/16/2009  . Irritable bowel syndrome 01/16/2009  . OSTEOARTHRITIS 12/15/2008  . Diabetes ([place]) 11/03/2008  . Hyperlipidemia 11/03/2008  . Anxiety state 11/03/2008  . OVERACTIVE BLADDER 11/03/2008  . OSA (obstructive sleep apnea) 11/03/2008  . MURMUR 11/03/2008   Past Medical History:  Diagnosis Date  . Anxiety   . Arthritis    back of neck, bones spurs on neck  . Cervical disc disease   . Diabetes mellitus   . Hyperlipidemia   . Hypertension   . Neuropathy   . Reflux   . Sleep apnea    wears CPAP occasionally  . Vertigo     Family History  Problem Relation Age of Onset  . Hypertension Other   . Diabetes Father   . Diabetes Sister   . Diabetes Maternal Aunt   . Colon cancer Neg Hx   . Esophageal cancer Neg Hx   . Stomach cancer Neg Hx   . Rectal cancer  Neg Hx     Past Surgical History:  Procedure Laterality Date  . BLADDER SURGERY    . BREAST SURGERY    . CHOLECYSTECTOMY    . fibroids removed     breast (both breasts)  . PARTIAL HYSTERECTOMY     Social History   Occupational History  . 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 Smoking status: Never Smoker  . Smokeless tobacco: Never Used  . Alcohol use No  . Drug use: No  . Sexual activity: Not Currently

## 2016-05-08 NOTE — Patient Instructions (Signed)
Avoid overhead lifting and overhead use of the arms.  No lifting greater than 10 lbs. May use ice or moist heat for pain. Start naprosyn or alleve for arthritis pain and shoulder pain, then ultracet if needed for pain not relieved by the naprosyn.  Go to PT in [place] with [facility]) for eval and treatment of the left shoulder frozen shoulder and cervical  Spine arthritis. Make appt with Dr. Fredna Dow for treatment of mucinous cyst right thumb IP joint.

## 2016-05-09 ENCOUNTER — Telehealth (INDEPENDENT_AMBULATORY_CARE_PROVIDER_SITE_OTHER): Payer: Self-pay | Admitting: Radiology

## 2016-05-09 NOTE — Telephone Encounter (Signed)
Patient needs referral to Dr. Fredna Dow.

## 2016-05-12 ENCOUNTER — Telehealth (INDEPENDENT_AMBULATORY_CARE_PROVIDER_SITE_OTHER): Payer: Self-pay | Admitting: Specialist

## 2016-05-12 NOTE — Telephone Encounter (Signed)
ERROR

## 2016-05-12 NOTE — Telephone Encounter (Signed)
Patient called needing to be referred to Dr.  Fredna Dow. Patient called Dr.  Levell July office and was told she need a referral to see him. The number to contact patient is [phone]

## 2016-05-13 ENCOUNTER — Ambulatory Visit: Payer: Medicare Other | Admitting: Physical Therapy

## 2016-05-13 NOTE — Telephone Encounter (Signed)
Patient called needing to be referred to Dr.  Fredna Dow. Patient called Dr.  Levell July office and was told she need a referral to see him. The number to contact patient is [phone]

## 2016-05-14 ENCOUNTER — Ambulatory Visit: Payer: Medicare Other | Attending: Specialist | Admitting: Physical Therapy

## 2016-05-14 DIAGNOSIS — G8929 Other chronic pain: Secondary | ICD-10-CM | POA: Insufficient documentation

## 2016-05-14 DIAGNOSIS — M436 Torticollis: Secondary | ICD-10-CM | POA: Diagnosis not present

## 2016-05-14 DIAGNOSIS — M25512 Pain in left shoulder: Secondary | ICD-10-CM | POA: Diagnosis not present

## 2016-05-14 DIAGNOSIS — M47812 Spondylosis without myelopathy or radiculopathy, cervical region: Secondary | ICD-10-CM | POA: Insufficient documentation

## 2016-05-14 DIAGNOSIS — M25511 Pain in right shoulder: Secondary | ICD-10-CM | POA: Diagnosis not present

## 2016-05-14 DIAGNOSIS — M542 Cervicalgia: Secondary | ICD-10-CM | POA: Insufficient documentation

## 2016-05-14 DIAGNOSIS — M25612 Stiffness of left shoulder, not elsewhere classified: Secondary | ICD-10-CM | POA: Diagnosis not present

## 2016-05-14 NOTE — Therapy (Signed)
[facility] [place], [place], [address] Phone: [phone]   Fax:  [phone]  Physical Therapy Evaluation  Patient Details  Name: Laura [name] MRN: [id number] Date of Birth: 1945-08-21 Referring Provider: Basil Dess MD  Encounter Date: 05/14/2016      PT End of Session - 05/14/16 1216    Activity Tolerance Patient tolerated treatment well   Behavior During Therapy [facility] for tasks assessed/performed      Past Medical History:  Diagnosis Date  . Anxiety   . Arthritis    back of neck, bones spurs on neck  . Cervical disc disease   . Diabetes mellitus   . Hyperlipidemia   . Hypertension   . Neuropathy   . Reflux   . Sleep apnea    wears CPAP occasionally  . Vertigo     Past Surgical History:  Procedure Laterality Date  . BLADDER SURGERY    . BREAST SURGERY    . CHOLECYSTECTOMY    . fibroids removed     breast (both breasts)  . PARTIAL HYSTERECTOMY      There were no vitals filed for this visit.       Subjective Assessment - 05/14/16 1222    Subjective The patient presents to OPPT with c/o neck and left shoulder pain.  She had PT in 2016 and did great.  She reports pain rated at a 8/10 today and does have some mild headaches on occasion.  She reports:  "I feel like I'm in a constant fog of pain."  She states that she has lost shoulder range of motion.  Ice, heat and medication decrease pain.  Movement increase her pain.  An MRI revealed cervical spondylosis and DDD at C3 to C7.            [facility] PT Assessment - 05/14/16 0001      Assessment   Medical Diagnosis Neck and left shoulder pain.   Referring Provider Basil Dess MD   Onset Date/Surgical Date --  3 years.     Precautions   Precautions None     Restrictions   Weight Bearing Restrictions No     Balance Screen   Has the patient fallen in the past 6 months No   Has the patient had a decrease in activity level because of a fear of  falling?  No   Is the patient reluctant to leave their home because of a fear of falling?  No     Home Environment   Living Environment Private residence     Prior Function   Level of Independence Independent     Posture/Postural Control   Posture/Postural Control Postural limitations   Postural Limitations Rounded Shoulders;Forward head   Posture Comments Cervical hyperlordosis.     ROM / Strength   AROM / PROM / Strength AROM;Strength     AROM   Overall AROM Comments Left shoulder active flexion= 105 degrees and 115 degrees passive, ER= 11 degrees; behind back to left hip and behind head limited to C2.   AROM Assessment Site Cervical   Cervical - Right Side Bend 20 degrees   Cervical - Left Side Bend 15 degrees.   Cervical - Right Rotation 70 degrees   Cervical - Left Rotation 52 degrees.     Strength   Overall Strength Comments Left shoulder IR/ER= 4/5.     Palpation   Palpation comment Tender to palpation over left suboccipital region; left UT, posterior cuff and left  Biceps.     Special Tests    Special Tests --  Absent left Biceps reflex.     Ambulation/Gait   Gait Comments Slow and purposeful.                   [facility] Adult PT Treatment/Exercise - 05/14/16 0001      Modalities   Modalities Electrical Stimulation;Moist Heat;Traction     Moist Heat Therapy   Number Minutes Moist Heat 15 Minutes   Moist Heat Location --  Left shoulder and neck.     Acupuncturist Location Left UT/left Infraspinatus and left biceps    Electrical Stimulation Action Pre-mod (5 sec on and 5 sec off) at 80-150 Hz x 15 minutes.   Electrical Stimulation Goals Tone;Pain     Traction   Type of Traction Cervical   Min (lbs) 5   Max (lbs) 12   Hold Time 99   Rest Time 5   Time 15                  PT Short Term Goals - 05/14/16 1214      PT SHORT TERM GOAL #1   Title Ind with HEP.   Time 2   Period Weeks   Status New      PT SHORT TERM GOAL #2   Title Patient will demonstrate an improvement of at least 10 degrees on all cervical planes with pain 0/10   Time 2   Period Weeks   Status New           PT Long Term Goals - 05/14/16 1214      PT LONG TERM GOAL #1   Title Perform ADL's with pain not > 3/10.   Time 8   Period Weeks   Status New     PT LONG TERM GOAL #2   Title Active left shoulder flexion to 145 degrees so the patient can easily reach overhead   Time 8   Period Weeks   Status New     PT LONG TERM GOAL #3   Title Active ER to 65 degrees+ to allow for easily donning/doffing of apparel   Time 8   Period Weeks   Status New     PT LONG TERM GOAL #4   Title Reach behind back with left hand to L3.   Time 8   Period Weeks   Status New               Plan - 05/14/16 1209    Clinical Impression Statement The patient presents to OPPT wiht c/o left sided neck pain and left shoulder pain.  She has a significant loss of cervical and shoulder range of motion.  Limtation impair her ability to perform her ADL's.  Patient will benefit from skilled physical therapy to decrease pain and improve functional mobility.   Rehab Potential Good   PT Frequency 2x / week   PT Duration 8 weeks   PT Treatment/Interventions ADLs/Self Care Home Management;Electrical Stimulation;Traction;Moist Heat;Therapeutic activities;Therapeutic exercise;Manual techniques;Patient/family education;Dry needling;Passive range of motion   PT Next Visit Plan Int traction; PROM to left shoulder and capsular stretching; wall climbs and HEP to increase ER; UE Ranger.  Please perform STW/M.Marland KitchenMarland KitchenTP release technique affected musculature including left Biceps.      Patient will benefit from skilled therapeutic intervention in order to improve the following deficits and impairments:  Pain, Decreased activity tolerance, Decreased range of motion, Decreased strength, Postural  dysfunction  Visit Diagnosis: Cervicalgia -  Plan: PT plan of care cert/re-cert  Chronic left shoulder pain - Plan: PT plan of care cert/re-cert      G-Codes - 17/71/16 1020    Functional Assessment Tool Used (Outpatient Only) FOTO...49% limitation.   Functional Limitation Self care   Self Care Current Status [phone]) At least 40 percent but less than 60 percent impaired, limited or restricted   Self Care Goal Status (Y3338) At least 20 percent but less than 40 percent impaired, limited or restricted       Problem List Patient Active Problem List   Diagnosis Date Noted  . Adhesive capsulitis of left shoulder 03/20/2016  . Fatigue 12/16/2015  . Acute bronchitis 10/31/2015  . Urinary frequency 03/09/2015  . Dizziness and giddiness 08/09/2014  . Morbid obesity ([place]) 07/25/2014  . Orthostatic hypotension 07/07/2014  . Upper airway cough syndrome 06/26/2014  . Diarrhea 05/02/2014  . Dizziness 04/12/2014  . Neuropathy   . Rash and nonspecific skin eruption 08/30/2013  . Paresthesia of both feet 08/30/2013  . Blurred vision, bilateral 02/15/2013  . Eye pain 02/15/2013  . Sinusitis, chronic 10/07/2012  . Left shoulder pain 04/15/2012  . Encounter for preventative adult health care exam with abnormal findings 10/22/2011  . Cervical disc disease 10/22/2011  . Bilateral hand pain 08/05/2011  . Reflux 06/29/2011  . Bloating 06/29/2011  . Back pain 03/13/2011  . Vertigo 01/04/2011  . Nystagmus 01/04/2011  . Insomnia 10/21/2010  . CONSTIPATION 08/17/2009  . VITAMIN D DEFICIENCY 05/15/2009  . DYSPNEA 03/13/2009  . Essential hypertension, benign 01/30/2009  . DEPRESSION 01/16/2009  . Irritable bowel syndrome 01/16/2009  . OSTEOARTHRITIS 12/15/2008  . Diabetes ([place]) 11/03/2008  . Hyperlipidemia 11/03/2008  . Anxiety state 11/03/2008  . OVERACTIVE BLADDER 11/03/2008  . OSA (obstructive sleep apnea) 11/03/2008  . MURMUR 11/03/2008    Arianie Couse, [place] MPT 05/14/2016, 12:45 PM  [facility] [place], [place], [address] Phone: [phone]   Fax:  [phone]  Name: Laura [name] MRN: [id number] Date of Birth: 07-07-1945

## 2016-05-15 ENCOUNTER — Encounter: Payer: Self-pay | Admitting: Physical Therapy

## 2016-05-15 ENCOUNTER — Ambulatory Visit: Payer: Medicare Other | Admitting: Physical Therapy

## 2016-05-15 DIAGNOSIS — M25512 Pain in left shoulder: Secondary | ICD-10-CM

## 2016-05-15 DIAGNOSIS — M542 Cervicalgia: Secondary | ICD-10-CM | POA: Diagnosis not present

## 2016-05-15 DIAGNOSIS — M436 Torticollis: Secondary | ICD-10-CM | POA: Diagnosis not present

## 2016-05-15 DIAGNOSIS — M25511 Pain in right shoulder: Secondary | ICD-10-CM | POA: Diagnosis not present

## 2016-05-15 DIAGNOSIS — G8929 Other chronic pain: Secondary | ICD-10-CM

## 2016-05-15 DIAGNOSIS — M25612 Stiffness of left shoulder, not elsewhere classified: Secondary | ICD-10-CM | POA: Diagnosis not present

## 2016-05-15 DIAGNOSIS — M47812 Spondylosis without myelopathy or radiculopathy, cervical region: Secondary | ICD-10-CM | POA: Diagnosis not present

## 2016-05-15 NOTE — Therapy (Signed)
[facility] [place], [place], [address] Phone: [phone]   Fax:  [phone]  Physical Therapy Treatment  Patient Details  Name: Laura [name] MRN: [id number] Date of Birth: [birth year] Referring Provider: Basil Dess MD  Encounter Date: 05/15/2016      PT End of Session - 05/15/16 1349    Visit Number 2   Number of Visits 16   Date for PT Re-Evaluation 07/13/16   PT Start Time 6010   PT Stop Time 1414   PT Time Calculation (min) 62 min   Activity Tolerance Patient tolerated treatment well   Behavior During Therapy [facility] for tasks assessed/performed      Past Medical History:  Diagnosis Date  . Anxiety   . Arthritis    back of neck, bones spurs on neck  . Cervical disc disease   . Diabetes mellitus   . Hyperlipidemia   . Hypertension   . Neuropathy   . Reflux   . Sleep apnea    wears CPAP occasionally  . Vertigo     Past Surgical History:  Procedure Laterality Date  . BLADDER SURGERY    . BREAST SURGERY    . CHOLECYSTECTOMY    . fibroids removed     breast (both breasts)  . PARTIAL HYSTERECTOMY      There were no vitals filed for this visit.      Subjective Assessment - 05/15/16 1315    Subjective Patient reported doing good after last treatment and ongoing soreness in neck and shoulders, left more pain than right   Pertinent History H/o neck and shoulder pain.  "Bone spurs."   Diagnostic tests X-ray and MRI.   Currently in Pain? Yes   Pain Score 5    Pain Location Neck   Pain Orientation Left   Pain Descriptors / Indicators Aching;Sore;Sharp   Pain Type Chronic pain   Pain Onset More than a month ago   Pain Frequency Constant   Aggravating Factors  reaching and certain movements   Pain Relieving Factors at rest and heat/ES                         [facility] Adult PT Treatment/Exercise - 05/15/16 0001      Exercises   Exercises Shoulder;Neck     Shoulder Exercises: Supine    Other Supine Exercises cane for flexion x5, difficulty due to pain     Shoulder Exercises: Standing   Other Standing Exercises wall slide x5, difficulty due to pain     Shoulder Exercises: Pulleys   Flexion Other (comment)  61min   Other Pulley Exercises standing UE ranger for elevation and circles 2x10 each     Moist Heat Therapy   Number Minutes Moist Heat 15 Minutes   Moist Heat Location Shoulder;Cervical     Electrical Stimulation   Electrical Stimulation Location left UT/infraspinatus and left bicep   Electrical Stimulation Action premod   Electrical Stimulation Parameters 1-10hz  x93min   Electrical Stimulation Goals Tone;Pain     Traction   Type of Traction Cervical   Min (lbs) 5   Max (lbs) 13   Hold Time 99   Rest Time 5   Time 15     Manual Therapy   Manual Therapy Passive ROM;Soft tissue mobilization   Soft tissue mobilization gentle manual STW to  bil c-spine, left UT, scap boarder and bicep area of pain   Passive ROM gentle PROM for left  shoulder flexion/ER with holds                  PT Short Term Goals - 05/15/16 1348      PT SHORT TERM GOAL #1   Title Ind with HEP.   Time 2   Period Weeks   Status On-going     PT SHORT TERM GOAL #2   Title Patient will demonstrate an improvement of at least 10 degrees on all cervical planes with pain 0/10   Time 2   Period Weeks   Status On-going           PT Long Term Goals - 05/15/16 1348      PT LONG TERM GOAL #1   Title Perform ADL's with pain not > 3/10.   Time 8   Period Weeks   Status On-going     PT LONG TERM GOAL #2   Title Active left shoulder flexion to 145 degrees so the patient can easily reach overhead   Time 8   Period Weeks   Status On-going     PT LONG TERM GOAL #3   Title Active ER to 65 degrees+ to allow for easily donning/doffing of apparel   Time 8   Period Weeks   Status On-going     PT LONG TERM GOAL #4   Title Reach behind back with left hand to L3.    Baseline Patient able to walk 2 miles, 5 days a week without pain   Time 8   Period Weeks   Status On-going               Plan - 05/15/16 1350    Clinical Impression Statement Patient tolerated treatment well today. Patient did well with pulley's and ranger today and reported more discomfort with wall slides and supine cane for flexion. Patient has tightness and pain for left shoulder flexion and ER today. Patient has palpable pain in right scapular boarder and along medial deltoid. Patient progressing toward goals yet ongoing due to pain and ROM deficts.     Rehab Potential Good   PT Frequency 2x / week   PT Duration 8 weeks   PT Treatment/Interventions ADLs/Self Care Home Management;Electrical Stimulation;Traction;Moist Heat;Therapeutic activities;Therapeutic exercise;Manual techniques;Patient/family education;Dry needling;Passive range of motion   PT Next Visit Plan cont with POC for Int traction; PROM to left shoulder and capsular stretching, pulleys, UE ranger and HEP to increase ER, STW/M.Marland KitchenMarland KitchenTP release technique affected musculature including left Biceps.   Consulted and Agree with Plan of Care Patient      Patient will benefit from skilled therapeutic intervention in order to improve the following deficits and impairments:  Pain, Decreased activity tolerance, Decreased range of motion, Decreased strength, Postural dysfunction  Visit Diagnosis: Cervicalgia  Chronic left shoulder pain       G-Codes - 06-13-16 1020    Functional Assessment Tool Used (Outpatient Only) FOTO...49% limitation.   Functional Limitation Self care   Self Care Current Status [phone]) At least 40 percent but less than 60 percent impaired, limited or restricted   Self Care Goal Status (O9735) At least 20 percent but less than 40 percent impaired, limited or restricted      Problem List Patient Active Problem List   Diagnosis Date Noted  . Adhesive capsulitis of left shoulder 03/20/2016  .  Fatigue 12/16/2015  . Acute bronchitis 10/31/2015  . Urinary frequency 03/09/2015  . Dizziness and giddiness 08/09/2014  . Morbid obesity ([place]) 07/25/2014  . Orthostatic  hypotension 07/07/2014  . Upper airway cough syndrome 06/26/2014  . Diarrhea 05/02/2014  . Dizziness 04/12/2014  . Neuropathy   . Rash and nonspecific skin eruption 08/30/2013  . Paresthesia of both feet 08/30/2013  . Blurred vision, bilateral 02/15/2013  . Eye pain 02/15/2013  . Sinusitis, chronic 10/07/2012  . Left shoulder pain 04/15/2012  . Encounter for preventative adult health care exam with abnormal findings 10/22/2011  . Cervical disc disease 10/22/2011  . Bilateral hand pain 08/05/2011  . Reflux 06/29/2011  . Bloating 06/29/2011  . Back pain 03/13/2011  . Vertigo 01/04/2011  . Nystagmus 01/04/2011  . Insomnia 10/21/2010  . CONSTIPATION 08/17/2009  . VITAMIN D DEFICIENCY 05/15/2009  . DYSPNEA 03/13/2009  . Essential hypertension, benign 01/30/2009  . DEPRESSION 01/16/2009  . Irritable bowel syndrome 01/16/2009  . OSTEOARTHRITIS 12/15/2008  . Diabetes ([place]) 11/03/2008  . Hyperlipidemia 11/03/2008  . Anxiety state 11/03/2008  . OVERACTIVE BLADDER 11/03/2008  . OSA (obstructive sleep apnea) 11/03/2008  . MURMUR 11/03/2008    Elisha Mcgruder P, PTA 05/15/2016, 2:18 PM  [facility] [place], [place], [address] Phone: [phone]   Fax:  [phone]  Name: MIRISSA LOPRESTI MRN: [id number] Date of Birth: [birth year]

## 2016-05-16 ENCOUNTER — Other Ambulatory Visit (INDEPENDENT_AMBULATORY_CARE_PROVIDER_SITE_OTHER): Payer: Self-pay | Admitting: Specialist

## 2016-05-16 DIAGNOSIS — M67441 Ganglion, right hand: Secondary | ICD-10-CM

## 2016-05-16 NOTE — Telephone Encounter (Signed)
Referral to Dr. Fredna Dow placed in chart. jen

## 2016-05-20 ENCOUNTER — Encounter: Payer: Self-pay | Admitting: Physical Therapy

## 2016-05-20 ENCOUNTER — Ambulatory Visit: Payer: Medicare Other | Admitting: Physical Therapy

## 2016-05-20 DIAGNOSIS — G8929 Other chronic pain: Secondary | ICD-10-CM | POA: Diagnosis not present

## 2016-05-20 DIAGNOSIS — M47812 Spondylosis without myelopathy or radiculopathy, cervical region: Secondary | ICD-10-CM | POA: Diagnosis not present

## 2016-05-20 DIAGNOSIS — M436 Torticollis: Secondary | ICD-10-CM | POA: Diagnosis not present

## 2016-05-20 DIAGNOSIS — M25612 Stiffness of left shoulder, not elsewhere classified: Secondary | ICD-10-CM | POA: Diagnosis not present

## 2016-05-20 DIAGNOSIS — M25512 Pain in left shoulder: Secondary | ICD-10-CM | POA: Diagnosis not present

## 2016-05-20 DIAGNOSIS — M25511 Pain in right shoulder: Secondary | ICD-10-CM

## 2016-05-20 DIAGNOSIS — M542 Cervicalgia: Secondary | ICD-10-CM

## 2016-05-20 NOTE — Therapy (Signed)
[facility] [place], [place], [address] Phone: [phone]   Fax:  [phone]  Physical Therapy Treatment  Patient Details  Name: Laura [name] MRN: [id number] Date of Birth: 04/20/1945 Referring Provider: Basil Dess, MD  Encounter Date: 05/20/2016      PT End of Session - 05/20/16 1123    Visit Number 3   Number of Visits 16   Date for PT Re-Evaluation 07/13/16   PT Start Time 1030   PT Stop Time 1115   PT Time Calculation (min) 45 min   Activity Tolerance Patient tolerated treatment well   Behavior During Therapy [facility] for tasks assessed/performed      Past Medical History:  Diagnosis Date  . Anxiety   . Arthritis    back of neck, bones spurs on neck  . Cervical disc disease   . Diabetes mellitus   . Hyperlipidemia   . Hypertension   . Neuropathy   . Reflux   . Sleep apnea    wears CPAP occasionally  . Vertigo     Past Surgical History:  Procedure Laterality Date  . BLADDER SURGERY    . BREAST SURGERY    . CHOLECYSTECTOMY    . fibroids removed     breast (both breasts)  . PARTIAL HYSTERECTOMY      There were no vitals filed for this visit.      Subjective Assessment - 05/20/16 1120    Subjective Patient arriving to therapy reporting increased pain in left shoulder of 6-7/10 which was worse in the morning.    Pertinent History H/o neck and shoulder pain.  "Bone spurs."   Diagnostic tests X-ray and MRI.   Currently in Pain? Yes   Pain Score 7    Pain Location Shoulder   Pain Orientation Left   Pain Descriptors / Indicators Aching;Sore   Pain Type Chronic pain   Pain Onset More than a month ago   Pain Frequency Constant   Aggravating Factors  reaching and certain movements   Pain Relieving Factors rest, heat            [facility] PT Assessment - 05/20/16 0001      Assessment   Medical Diagnosis neck and left shoulder   Referring Provider Basil Dess, MD     Precautions   Precautions  None     Restrictions   Weight Bearing Restrictions No     Balance Screen   Has the patient fallen in the past 6 months No   Has the patient had a decrease in activity level because of a fear of falling?  No   Is the patient reluctant to leave their home because of a fear of falling?  No                     [facility] Adult PT Treatment/Exercise - 05/20/16 0001      Exercises   Exercises Shoulder;Neck     Shoulder Exercises: Pulleys   Flexion Other (comment)  15min   Other Pulley Exercises standing UE ranger for elevation and circles 2x10 each     Traction   Type of Traction Cervical   Min (lbs) 5   Max (lbs) 15   Hold Time 99   Rest Time 5   Time 15     Manual Therapy   Manual Therapy Soft tissue mobilization   Soft tissue mobilization gentle manual STW to  bil c-spine, left UT,  medial scap boarder  and areas of pain, Pt with several trigger points noted along medial scapular border, trigger point release performed   Passive ROM gentle PROM for left shoulder flexion/ER with holds                PT Education - 05/20/16 1122    Education provided Yes   Education Details Posture correction   Person(s) Educated Patient   Methods Explanation;Demonstration   Comprehension Verbalized understanding;Returned demonstration          PT Short Term Goals - 05/15/16 1348      PT SHORT TERM GOAL #1   Title Ind with HEP.   Time 2   Period Weeks   Status On-going     PT SHORT TERM GOAL #2   Title Patient will demonstrate an improvement of at least 10 degrees on all cervical planes with pain 0/10   Time 2   Period Weeks   Status On-going           PT Long Term Goals - 05/20/16 1130      PT LONG TERM GOAL #1   Title Perform ADL's with pain not > 3/10.   Time 8   Period Weeks   Status On-going     PT LONG TERM GOAL #2   Title Active left shoulder flexion to 145 degrees so the patient can easily reach overhead   Baseline 9/21- 11.3 at best     Time 8   Status On-going     PT LONG TERM GOAL #3   Title Active ER to 65 degrees+ to allow for easily donning/doffing of apparel   Time 8   Period Weeks   Status On-going     PT LONG TERM GOAL #4   Title Reach behind back with left hand to L3.   Baseline Patient able to walk 2 miles, 5 days a week without pain   Time 8   Period Weeks   Status On-going     PT LONG TERM GOAL #5   Title Perform ADL's with pain not > 3/10.   Time 6   Period Weeks   Status New               Plan - 05/20/16 1124    Clinical Impression Statement Patient tolerated treatment well. Reporting less pain at end of session of 4/10. Pt issued biofreeze to use at home for self massage. Pt tolerated traction well. Edu pt on importance of sitting posture and posture correction.    Rehab Potential Good   PT Frequency 2x / week   PT Duration 8 weeks   PT Treatment/Interventions ADLs/Self Care Home Management;Electrical Stimulation;Traction;Moist Heat;Therapeutic activities;Therapeutic exercise;Manual techniques;Patient/family education;Dry needling;Passive range of motion   PT Next Visit Plan cont with POC for Int traction; PROM to left shoulder and capsular stretching, pulleys, UE ranger and HEP to increase ER, STW/M.Marland KitchenMarland KitchenTP release technique affected musculature including left Biceps.   Consulted and Agree with Plan of Care Patient      Patient will benefit from skilled therapeutic intervention in order to improve the following deficits and impairments:  Pain, Decreased activity tolerance, Decreased range of motion, Decreased strength, Postural dysfunction  Visit Diagnosis: Cervicalgia  Chronic left shoulder pain  Neck pain  Shoulder stiffness, left  Pain in joint of right shoulder  Cervical spondylosis without myelopathy  Neck stiffness     Problem List Patient Active Problem List   Diagnosis Date Noted  . Adhesive capsulitis of left shoulder 03/20/2016  .  Fatigue 12/16/2015  . Acute  bronchitis 10/31/2015  . Urinary frequency 03/09/2015  . Dizziness and giddiness 08/09/2014  . Morbid obesity ([place]) 07/25/2014  . Orthostatic hypotension 07/07/2014  . Upper airway cough syndrome 06/26/2014  . Diarrhea 05/02/2014  . Dizziness 04/12/2014  . Neuropathy   . Rash and nonspecific skin eruption 08/30/2013  . Paresthesia of both feet 08/30/2013  . Blurred vision, bilateral 02/15/2013  . Eye pain 02/15/2013  . Sinusitis, chronic 10/07/2012  . Left shoulder pain 04/15/2012  . Encounter for preventative adult health care exam with abnormal findings 10/22/2011  . Cervical disc disease 10/22/2011  . Bilateral hand pain 08/05/2011  . Reflux 06/29/2011  . Bloating 06/29/2011  . Back pain 03/13/2011  . Vertigo 01/04/2011  . Nystagmus 01/04/2011  . Insomnia 10/21/2010  . CONSTIPATION 08/17/2009  . VITAMIN D DEFICIENCY 05/15/2009  . DYSPNEA 03/13/2009  . Essential hypertension, benign 01/30/2009  . DEPRESSION 01/16/2009  . Irritable bowel syndrome 01/16/2009  . OSTEOARTHRITIS 12/15/2008  . Diabetes ([place]) 11/03/2008  . Hyperlipidemia 11/03/2008  . Anxiety state 11/03/2008  . OVERACTIVE BLADDER 11/03/2008  . OSA (obstructive sleep apnea) 11/03/2008  . MURMUR 11/03/2008    Oretha Caprice, MPT 05/20/2016, 11:42 AM  [facility] [address] [place], [place], [address] Phone: [phone]   Fax:  [phone]  Name: Laura [name] MRN: [id number] Date of Birth: 1945/04/24

## 2016-05-21 ENCOUNTER — Ambulatory Visit (INDEPENDENT_AMBULATORY_CARE_PROVIDER_SITE_OTHER): Payer: 59 | Admitting: Psychiatry

## 2016-05-21 DIAGNOSIS — F330 Major depressive disorder, recurrent, mild: Secondary | ICD-10-CM

## 2016-05-23 ENCOUNTER — Ambulatory Visit: Payer: Medicare Other | Admitting: Physical Therapy

## 2016-05-23 ENCOUNTER — Encounter: Payer: Self-pay | Admitting: Physical Therapy

## 2016-05-23 DIAGNOSIS — M47812 Spondylosis without myelopathy or radiculopathy, cervical region: Secondary | ICD-10-CM | POA: Diagnosis not present

## 2016-05-23 DIAGNOSIS — M542 Cervicalgia: Secondary | ICD-10-CM

## 2016-05-23 DIAGNOSIS — M25511 Pain in right shoulder: Secondary | ICD-10-CM | POA: Diagnosis not present

## 2016-05-23 DIAGNOSIS — M25512 Pain in left shoulder: Secondary | ICD-10-CM | POA: Diagnosis not present

## 2016-05-23 DIAGNOSIS — G8929 Other chronic pain: Secondary | ICD-10-CM

## 2016-05-23 DIAGNOSIS — M25612 Stiffness of left shoulder, not elsewhere classified: Secondary | ICD-10-CM | POA: Diagnosis not present

## 2016-05-23 DIAGNOSIS — M436 Torticollis: Secondary | ICD-10-CM | POA: Diagnosis not present

## 2016-05-23 NOTE — Telephone Encounter (Signed)
Referral request placed. jen

## 2016-05-23 NOTE — Therapy (Signed)
[facility] [place], [place], [address] Phone: [phone]   Fax:  [phone]  Physical Therapy Treatment  Patient Details  Name: Laura [name] MRN: [id number] Date of Birth: August 15, 1945 Referring Provider: Basil Dess, MD  Encounter Date: 05/23/2016      PT End of Session - 05/23/16 1202    Visit Number 4   Number of Visits 16   Date for PT Re-Evaluation 07/13/16   PT Start Time 1030  3 units secondary to time required for equiplment setup   PT Stop Time 1140   PT Time Calculation (min) 70 min   Activity Tolerance Patient tolerated treatment well   Behavior During Therapy [facility] for tasks assessed/performed      Past Medical History:  Diagnosis Date  . Anxiety   . Arthritis    back of neck, bones spurs on neck  . Cervical disc disease   . Diabetes mellitus   . Hyperlipidemia   . Hypertension   . Neuropathy   . Reflux   . Sleep apnea    wears CPAP occasionally  . Vertigo     Past Surgical History:  Procedure Laterality Date  . BLADDER SURGERY    . BREAST SURGERY    . CHOLECYSTECTOMY    . fibroids removed     breast (both breasts)  . PARTIAL HYSTERECTOMY      There were no vitals filed for this visit.      Subjective Assessment - 05/23/16 1039    Subjective Reports that she has tightness in B Biceps and that her L shoulder is really sore. Also states that L shoulder ROM has improved. Patient reports that she also has trigger points developing in R bicep but not as painful as L bicep.   Pertinent History H/o neck and shoulder pain.  "Bone spurs."   Diagnostic tests X-ray and MRI.   Currently in Pain? Yes   Pain Score 6    Pain Location Shoulder   Pain Orientation Left   Pain Descriptors / Indicators Sore   Pain Type Chronic pain   Pain Onset More than a month ago   Pain Score 2   Pain Location Neck   Pain Orientation Right;Left   Pain Descriptors / Indicators Shooting   Pain Type Chronic pain    Pain Onset More than a month ago   Pain Frequency Intermittent                         [facility] Adult PT Treatment/Exercise - 05/23/16 0001      Shoulder Exercises: Pulleys   Flexion Other (comment)  x5 min   Other Pulley Exercises Seated UE ranger flex/circles x5 min     Modalities   Modalities Electrical Stimulation;Traction     Acupuncturist Location B UT, L Bicep   Electrical Stimulation Action Pre-Mod   Electrical Stimulation Parameters 80-150 hz x15 min   Electrical Stimulation Goals Tone;Pain     Traction   Type of Traction Cervical   Min (lbs) 5   Max (lbs) 15   Hold Time Static   Time 15     Manual Therapy   Manual Therapy Soft tissue mobilization   Soft tissue mobilization Gentle STW to L Bicep and B UT to reduce tone and pain but patient very sensitive to manual therapy today.  PT Short Term Goals - 05/15/16 1348      PT SHORT TERM GOAL #1   Title Ind with HEP.   Time 2   Period Weeks   Status On-going     PT SHORT TERM GOAL #2   Title Patient will demonstrate an improvement of at least 10 degrees on all cervical planes with pain 0/10   Time 2   Period Weeks   Status On-going           PT Long Term Goals - 05/20/16 1130      PT LONG TERM GOAL #1   Title Perform ADL's with pain not > 3/10.   Time 8   Period Weeks   Status On-going     PT LONG TERM GOAL #2   Title Active left shoulder flexion to 145 degrees so the patient can easily reach overhead   Baseline 9/21- 11.3 at best    Time 8   Status On-going     PT LONG TERM GOAL #3   Title Active ER to 65 degrees+ to allow for easily donning/doffing of apparel   Time 8   Period Weeks   Status On-going     PT LONG TERM GOAL #4   Title Reach behind back with left hand to L3.   Baseline Patient able to walk 2 miles, 5 days a week without pain   Time 8   Period Weeks   Status On-going     PT LONG TERM GOAL  #5   Title Perform ADL's with pain not > 3/10.   Time 6   Period Weeks   Status New               Plan - 05/23/16 1218    Clinical Impression Statement Patient tolerated today's treatment fairly well as she arrived with continued pain and tone throughout B UT and L Bicep region. Patient experienced intermittant shooting pains in L shoulder during seated ROM exercises for her shoulder. Patient very sensitive to manual therapy to B UT today which were palpated with increased tone especially along the supeiror most fibers. Traction completed today at 15# with no complaints by patient during traction session. Patient requested electrical stimulation today due to her increased pain. Normal modalities response noted folllowing removal of the modalities.   Rehab Potential Good   PT Frequency 2x / week   PT Duration 8 weeks   PT Treatment/Interventions ADLs/Self Care Home Management;Electrical Stimulation;Traction;Moist Heat;Therapeutic activities;Therapeutic exercise;Manual techniques;Patient/family education;Dry needling;Passive range of motion   PT Next Visit Plan cont with POC for Int traction; PROM to left shoulder and capsular stretching, pulleys, UE ranger and HEP to increase ER, STW/M.Marland KitchenMarland KitchenTP release technique affected musculature including left Biceps.   Consulted and Agree with Plan of Care Patient      Patient will benefit from skilled therapeutic intervention in order to improve the following deficits and impairments:  Pain, Decreased activity tolerance, Decreased range of motion, Decreased strength, Postural dysfunction  Visit Diagnosis: Cervicalgia  Chronic left shoulder pain     Problem List Patient Active Problem List   Diagnosis Date Noted  . Adhesive capsulitis of left shoulder 03/20/2016  . Fatigue 12/16/2015  . Acute bronchitis 10/31/2015  . Urinary frequency 03/09/2015  . Dizziness and giddiness 08/09/2014  . Morbid obesity ([place]) 07/25/2014  . Orthostatic  hypotension 07/07/2014  . Upper airway cough syndrome 06/26/2014  . Diarrhea 05/02/2014  . Dizziness 04/12/2014  . Neuropathy   . Rash and nonspecific skin  eruption 08/30/2013  . Paresthesia of both feet 08/30/2013  . Blurred vision, bilateral 02/15/2013  . Eye pain 02/15/2013  . Sinusitis, chronic 10/07/2012  . Left shoulder pain 04/15/2012  . Encounter for preventative adult health care exam with abnormal findings 10/22/2011  . Cervical disc disease 10/22/2011  . Bilateral hand pain 08/05/2011  . Reflux 06/29/2011  . Bloating 06/29/2011  . Back pain 03/13/2011  . Vertigo 01/04/2011  . Nystagmus 01/04/2011  . Insomnia 10/21/2010  . CONSTIPATION 08/17/2009  . VITAMIN D DEFICIENCY 05/15/2009  . DYSPNEA 03/13/2009  . Essential hypertension, benign 01/30/2009  . DEPRESSION 01/16/2009  . Irritable bowel syndrome 01/16/2009  . OSTEOARTHRITIS 12/15/2008  . Diabetes ([place]) 11/03/2008  . Hyperlipidemia 11/03/2008  . Anxiety state 11/03/2008  . OVERACTIVE BLADDER 11/03/2008  . OSA (obstructive sleep apnea) 11/03/2008  . MURMUR 11/03/2008    Wynelle Fanny, PTA 05/23/2016, 12:23 PM  [facility] [address] [place], [place], [address] Phone: [phone]   Fax:  [phone]  Name: Laura [name] MRN: [id number] Date of Birth: [birth year]

## 2016-05-26 DIAGNOSIS — T148XXA Other injury of unspecified body region, initial encounter: Secondary | ICD-10-CM | POA: Insufficient documentation

## 2016-05-26 DIAGNOSIS — M79644 Pain in right finger(s): Secondary | ICD-10-CM | POA: Diagnosis not present

## 2016-05-26 DIAGNOSIS — M6740 Ganglion, unspecified site: Secondary | ICD-10-CM | POA: Diagnosis not present

## 2016-05-26 DIAGNOSIS — M79642 Pain in left hand: Secondary | ICD-10-CM | POA: Insufficient documentation

## 2016-05-26 DIAGNOSIS — R2231 Localized swelling, mass and lump, right upper limb: Secondary | ICD-10-CM | POA: Diagnosis not present

## 2016-05-26 DIAGNOSIS — M180 Bilateral primary osteoarthritis of first carpometacarpal joints: Secondary | ICD-10-CM | POA: Diagnosis not present

## 2016-05-26 DIAGNOSIS — M19041 Primary osteoarthritis, right hand: Secondary | ICD-10-CM | POA: Diagnosis not present

## 2016-05-27 ENCOUNTER — Ambulatory Visit: Payer: Medicare Other | Admitting: *Deleted

## 2016-05-27 ENCOUNTER — Other Ambulatory Visit: Payer: Self-pay | Admitting: Orthopedic Surgery

## 2016-05-27 DIAGNOSIS — M25612 Stiffness of left shoulder, not elsewhere classified: Secondary | ICD-10-CM | POA: Diagnosis not present

## 2016-05-27 DIAGNOSIS — G8929 Other chronic pain: Secondary | ICD-10-CM | POA: Diagnosis not present

## 2016-05-27 DIAGNOSIS — M47812 Spondylosis without myelopathy or radiculopathy, cervical region: Secondary | ICD-10-CM | POA: Diagnosis not present

## 2016-05-27 DIAGNOSIS — M25512 Pain in left shoulder: Secondary | ICD-10-CM | POA: Diagnosis not present

## 2016-05-27 DIAGNOSIS — M436 Torticollis: Secondary | ICD-10-CM | POA: Diagnosis not present

## 2016-05-27 DIAGNOSIS — M542 Cervicalgia: Secondary | ICD-10-CM

## 2016-05-27 DIAGNOSIS — M25511 Pain in right shoulder: Secondary | ICD-10-CM | POA: Diagnosis not present

## 2016-05-27 NOTE — Therapy (Signed)
[facility] [place], [place], [address] Phone: [phone]   Fax:  [phone]  Physical Therapy Treatment  Patient Details  Name: Laura [name] MRN: [id number] Date of Birth: 23-Jan-1945 Referring Provider: Basil Dess, MD  Encounter Date: 05/27/2016      PT End of Session - 05/27/16 1653    Visit Number 5   Number of Visits 16   Date for PT Re-Evaluation 07/13/16   PT Start Time 2637   PT Stop Time 1552   PT Time Calculation (min) 37 min      Past Medical History:  Diagnosis Date  . Anxiety   . Arthritis    back of neck, bones spurs on neck  . Cervical disc disease   . Diabetes mellitus   . Hyperlipidemia   . Hypertension   . Neuropathy   . Reflux   . Sleep apnea    wears CPAP occasionally  . Vertigo     Past Surgical History:  Procedure Laterality Date  . BLADDER SURGERY    . BREAST SURGERY    . CHOLECYSTECTOMY    . fibroids removed     breast (both breasts)  . PARTIAL HYSTERECTOMY      There were no vitals filed for this visit.      Subjective Assessment - 05/27/16 1532    Subjective I fell this Sunday and used both hands to break my fall. My neck  and LT shldr are sore 4-5/10   Pertinent History H/o neck and shoulder pain.  "Bone spurs."   Diagnostic tests X-ray and MRI.   Currently in Pain? Yes   Pain Score 5    Pain Location Shoulder   Pain Orientation Left   Pain Descriptors / Indicators Sore   Pain Type Chronic pain   Pain Onset More than a month ago                         [facility] Adult PT Treatment/Exercise - 05/27/16 0001      Exercises   Exercises Shoulder;Neck     Modalities   Modalities Electrical Stimulation;Traction     Moist Heat Therapy   Number Minutes Moist Heat 15 Minutes   Moist Heat Location Shoulder;Cervical     Electrical Stimulation   Electrical Stimulation Location Bil UT IFC x 15 mins 80-150hz    Electrical Stimulation Goals Tone;Pain      Traction   Type of Traction --   Min (lbs) --   Max (lbs) --   Hold Time --   Rest Time --   Time --     Manual Therapy   Manual Therapy Soft tissue mobilization   Soft tissue mobilization STW and TPR to BIL UTs and Levators                  PT Short Term Goals - 05/15/16 1348      PT SHORT TERM GOAL #1   Title Ind with HEP.   Time 2   Period Weeks   Status On-going     PT SHORT TERM GOAL #2   Title Patient will demonstrate an improvement of at least 10 degrees on all cervical planes with pain 0/10   Time 2   Period Weeks   Status On-going           PT Long Term Goals - 05/20/16 1130      PT LONG TERM GOAL #1   Title  Perform ADL's with pain not > 3/10.   Time 8   Period Weeks   Status On-going     PT LONG TERM GOAL #2   Title Active left shoulder flexion to 145 degrees so the patient can easily reach overhead   Baseline 9/21- 11.3 at best    Time 8   Status On-going     PT LONG TERM GOAL #3   Title Active ER to 65 degrees+ to allow for easily donning/doffing of apparel   Time 8   Period Weeks   Status On-going     PT LONG TERM GOAL #4   Title Reach behind back with left hand to L3.   Baseline Patient able to walk 2 miles, 5 days a week without pain   Time 8   Period Weeks   Status On-going     PT LONG TERM GOAL #5   Title Perform ADL's with pain not > 3/10.   Time 6   Period Weeks   Status New               Plan - 05/27/16 1538    Clinical Impression Statement Pt arrived to clinic today feeling increased soreness in neck and both shldrs due to falling 2 days ago and breaking her fall with both hands. She requested no exs and traction today. STW /TPR to Bil. UTs and levator muscles with good releases on both. Pt felt good after Rx with decreased tightness and pain.   Rehab Potential Good   PT Frequency 2x / week   PT Duration 8 weeks   PT Treatment/Interventions ADLs/Self Care Home Management;Electrical  Stimulation;Traction;Moist Heat;Therapeutic activities;Therapeutic exercise;Manual techniques;Patient/family education;Dry needling;Passive range of motion   PT Next Visit Plan cont with POC for Int traction; PROM to left shoulder and capsular stretching, pulleys, UE ranger and HEP to increase ER, STW/M.Marland KitchenMarland KitchenTP release technique affected musculature including left Biceps.   Consulted and Agree with Plan of Care Patient      Patient will benefit from skilled therapeutic intervention in order to improve the following deficits and impairments:  Pain, Decreased activity tolerance, Decreased range of motion, Decreased strength, Postural dysfunction  Visit Diagnosis: Cervicalgia  Chronic left shoulder pain  Neck pain  Shoulder stiffness, left     Problem List Patient Active Problem List   Diagnosis Date Noted  . Adhesive capsulitis of left shoulder 03/20/2016  . Fatigue 12/16/2015  . Acute bronchitis 10/31/2015  . Urinary frequency 03/09/2015  . Dizziness and giddiness 08/09/2014  . Morbid obesity ([place]) 07/25/2014  . Orthostatic hypotension 07/07/2014  . Upper airway cough syndrome 06/26/2014  . Diarrhea 05/02/2014  . Dizziness 04/12/2014  . Neuropathy   . Rash and nonspecific skin eruption 08/30/2013  . Paresthesia of both feet 08/30/2013  . Blurred vision, bilateral 02/15/2013  . Eye pain 02/15/2013  . Sinusitis, chronic 10/07/2012  . Left shoulder pain 04/15/2012  . Encounter for preventative adult health care exam with abnormal findings 10/22/2011  . Cervical disc disease 10/22/2011  . Bilateral hand pain 08/05/2011  . Reflux 06/29/2011  . Bloating 06/29/2011  . Back pain 03/13/2011  . Vertigo 01/04/2011  . Nystagmus 01/04/2011  . Insomnia 10/21/2010  . CONSTIPATION 08/17/2009  . VITAMIN D DEFICIENCY 05/15/2009  . DYSPNEA 03/13/2009  . Essential hypertension, benign 01/30/2009  . DEPRESSION 01/16/2009  . Irritable bowel syndrome 01/16/2009  . OSTEOARTHRITIS  12/15/2008  . Diabetes ([place]) 11/03/2008  . Hyperlipidemia 11/03/2008  . Anxiety state 11/03/2008  . OVERACTIVE BLADDER  11/03/2008  . OSA (obstructive sleep apnea) 11/03/2008  . MURMUR 11/03/2008    Patrcia Schnepp,CHRIS. PTA 05/27/2016, 5:06 PM  [facility] [place], [place], [address] Phone: [phone]   Fax:  [phone]  Name: Laura [name] MRN: [id number] Date of Birth: 27-Jul-1945

## 2016-05-29 ENCOUNTER — Ambulatory Visit: Payer: Medicare Other | Admitting: Physical Therapy

## 2016-05-29 ENCOUNTER — Encounter: Payer: Self-pay | Admitting: Physical Therapy

## 2016-05-29 DIAGNOSIS — M25512 Pain in left shoulder: Secondary | ICD-10-CM

## 2016-05-29 DIAGNOSIS — M436 Torticollis: Secondary | ICD-10-CM | POA: Diagnosis not present

## 2016-05-29 DIAGNOSIS — M542 Cervicalgia: Secondary | ICD-10-CM | POA: Diagnosis not present

## 2016-05-29 DIAGNOSIS — M47812 Spondylosis without myelopathy or radiculopathy, cervical region: Secondary | ICD-10-CM | POA: Diagnosis not present

## 2016-05-29 DIAGNOSIS — G8929 Other chronic pain: Secondary | ICD-10-CM

## 2016-05-29 DIAGNOSIS — M25511 Pain in right shoulder: Secondary | ICD-10-CM | POA: Diagnosis not present

## 2016-05-29 DIAGNOSIS — M25612 Stiffness of left shoulder, not elsewhere classified: Secondary | ICD-10-CM | POA: Diagnosis not present

## 2016-05-29 NOTE — Therapy (Signed)
[facility] [place], [place], [address] Phone: [phone]   Fax:  [phone]  Physical Therapy Treatment  Patient Details  Name: Laura [name] MRN: [id number] Date of Birth: 07-01-1945 Referring Provider: Basil Dess, MD  Encounter Date: 05/29/2016      PT End of Session - 05/29/16 1017    Visit Number 6   Number of Visits 16   Date for PT Re-Evaluation 07/13/16   PT Start Time 0946   PT Stop Time 1045   PT Time Calculation (min) 59 min   Activity Tolerance Patient tolerated treatment well   Behavior During Therapy [facility] for tasks assessed/performed      Past Medical History:  Diagnosis Date  . Anxiety   . Arthritis    back of neck, bones spurs on neck  . Cervical disc disease   . Diabetes mellitus   . Hyperlipidemia   . Hypertension   . Neuropathy   . Reflux   . Sleep apnea    wears CPAP occasionally  . Vertigo     Past Surgical History:  Procedure Laterality Date  . BLADDER SURGERY    . BREAST SURGERY    . CHOLECYSTECTOMY    . fibroids removed     breast (both breasts)  . PARTIAL HYSTERECTOMY      There were no vitals filed for this visit.      Subjective Assessment - 05/29/16 0956    Subjective Patient reported ongoing soreness from her fall. difficulty with her movement in shoulder   Pertinent History H/o neck and shoulder pain.  "Bone spurs."   Diagnostic tests X-ray and MRI.   Currently in Pain? Yes   Pain Score 5    Pain Location Shoulder   Pain Orientation Left   Pain Descriptors / Indicators Sore   Pain Type Chronic pain   Pain Onset More than a month ago   Pain Frequency Constant   Aggravating Factors  reaching and movement   Pain Relieving Factors rest and heat   Multiple Pain Sites Yes                         [facility] Adult PT Treatment/Exercise - 05/29/16 0001      Shoulder Exercises: Pulleys   Flexion Other (comment)  74min   Other Pulley Exercises Seated  UE ranger flex/circles x5 min     Moist Heat Therapy   Number Minutes Moist Heat 10 Minutes   Moist Heat Location Shoulder;Cervical     Electrical Stimulation   Electrical Stimulation Location shoulder and c-spine IFC x 10 mins 80-150hz    Electrical Stimulation Goals Tone;Pain     Traction   Type of Traction Cervical   Min (lbs) 5   Max (lbs) 15   Hold Time 99   Rest Time 5   Time 15     Manual Therapy   Manual Therapy Soft tissue mobilization   Soft tissue mobilization STW and TPR to left UT Levator and post cap                  PT Short Term Goals - 05/15/16 1348      PT SHORT TERM GOAL #1   Title Ind with HEP.   Time 2   Period Weeks   Status On-going     PT SHORT TERM GOAL #2   Title Patient will demonstrate an improvement of at least 10 degrees on all cervical planes  with pain 0/10   Time 2   Period Weeks   Status On-going           PT Long Term Goals - 05/20/16 1130      PT LONG TERM GOAL #1   Title Perform ADL's with pain not > 3/10.   Time 8   Period Weeks   Status On-going     PT LONG TERM GOAL #2   Title Active left shoulder flexion to 145 degrees so the patient can easily reach overhead   Baseline 9/21- 11.3 at best    Time 8   Status On-going     PT LONG TERM GOAL #3   Title Active ER to 65 degrees+ to allow for easily donning/doffing of apparel   Time 8   Period Weeks   Status On-going     PT LONG TERM GOAL #4   Title Reach behind back with left hand to L3.   Baseline Patient able to walk 2 miles, 5 days a week without pain   Time 8   Period Weeks   Status On-going     PT LONG TERM GOAL #5   Title Perform ADL's with pain not > 3/10.   Time 6   Period Weeks   Status New               Plan - 05/29/16 1021    Clinical Impression Statement Patient tolerated treatment well today. Patient able to perform gentle AAROM exercises to increased left shoulder movement with some soreness. Patient had some soreness left  shoulder UT/post cap today with tightness and palpaple trigger points and pain. Patient did well with cervical traction today. Patient current goals ongoing due o pain, ROM and strength deficts.    Rehab Potential Good   PT Frequency 2x / week   PT Duration 8 weeks   PT Treatment/Interventions ADLs/Self Care Home Management;Electrical Stimulation;Traction;Moist Heat;Therapeutic activities;Therapeutic exercise;Manual techniques;Patient/family education;Dry needling;Passive range of motion   PT Next Visit Plan cont with POC for Int traction; PROM to left shoulder and capsular stretching, pulleys, UE ranger and HEP to increase ER, STW/M.Marland KitchenMarland KitchenTP release technique affected musculature including left Biceps.   Consulted and Agree with Plan of Care Patient      Patient will benefit from skilled therapeutic intervention in order to improve the following deficits and impairments:  Pain, Decreased activity tolerance, Decreased range of motion, Decreased strength, Postural dysfunction  Visit Diagnosis: Cervicalgia  Chronic left shoulder pain     Problem List Patient Active Problem List   Diagnosis Date Noted  . Adhesive capsulitis of left shoulder 03/20/2016  . Fatigue 12/16/2015  . Acute bronchitis 10/31/2015  . Urinary frequency 03/09/2015  . Dizziness and giddiness 08/09/2014  . Morbid obesity ([place]) 07/25/2014  . Orthostatic hypotension 07/07/2014  . Upper airway cough syndrome 06/26/2014  . Diarrhea 05/02/2014  . Dizziness 04/12/2014  . Neuropathy   . Rash and nonspecific skin eruption 08/30/2013  . Paresthesia of both feet 08/30/2013  . Blurred vision, bilateral 02/15/2013  . Eye pain 02/15/2013  . Sinusitis, chronic 10/07/2012  . Left shoulder pain 04/15/2012  . Encounter for preventative adult health care exam with abnormal findings 10/22/2011  . Cervical disc disease 10/22/2011  . Bilateral hand pain 08/05/2011  . Reflux 06/29/2011  . Bloating 06/29/2011  . Back pain 03/13/2011   . Vertigo 01/04/2011  . Nystagmus 01/04/2011  . Insomnia 10/21/2010  . CONSTIPATION 08/17/2009  . VITAMIN D DEFICIENCY 05/15/2009  . DYSPNEA 03/13/2009  .  Essential hypertension, benign 01/30/2009  . DEPRESSION 01/16/2009  . Irritable bowel syndrome 01/16/2009  . OSTEOARTHRITIS 12/15/2008  . Diabetes ([place]) 11/03/2008  . Hyperlipidemia 11/03/2008  . Anxiety state 11/03/2008  . OVERACTIVE BLADDER 11/03/2008  . OSA (obstructive sleep apnea) 11/03/2008  . MURMUR 11/03/2008    Areanna Gengler P, PTA 05/29/2016, 10:48 AM  [facility] [place], [place], [address] Phone: [phone]   Fax:  [phone]  Name: Laura [name] MRN: [id number] Date of Birth: 04/22/1945

## 2016-06-02 ENCOUNTER — Encounter: Payer: Self-pay | Admitting: Physical Therapy

## 2016-06-02 ENCOUNTER — Ambulatory Visit: Payer: Medicare Other | Admitting: Physical Therapy

## 2016-06-02 DIAGNOSIS — M436 Torticollis: Secondary | ICD-10-CM | POA: Diagnosis not present

## 2016-06-02 DIAGNOSIS — M25512 Pain in left shoulder: Secondary | ICD-10-CM

## 2016-06-02 DIAGNOSIS — M25612 Stiffness of left shoulder, not elsewhere classified: Secondary | ICD-10-CM | POA: Diagnosis not present

## 2016-06-02 DIAGNOSIS — M542 Cervicalgia: Secondary | ICD-10-CM | POA: Diagnosis not present

## 2016-06-02 DIAGNOSIS — G8929 Other chronic pain: Secondary | ICD-10-CM

## 2016-06-02 DIAGNOSIS — M25511 Pain in right shoulder: Secondary | ICD-10-CM | POA: Diagnosis not present

## 2016-06-02 DIAGNOSIS — M47812 Spondylosis without myelopathy or radiculopathy, cervical region: Secondary | ICD-10-CM | POA: Diagnosis not present

## 2016-06-02 NOTE — Therapy (Signed)
[facility] [place], [place], [address] Phone: [phone]   Fax:  [phone]  Physical Therapy Treatment  Patient Details  Name: Laura [name] MRN: [id number] Date of Birth: [birth year] Referring Provider: Basil Dess, MD  Encounter Date: 06/02/2016      PT End of Session - 06/02/16 1105    Visit Number 7   Number of Visits 16   Date for PT Re-Evaluation 07/13/16   PT Start Time 1030   PT Stop Time 1127   PT Time Calculation (min) 57 min   Activity Tolerance Patient tolerated treatment well   Behavior During Therapy [facility] for tasks assessed/performed      Past Medical History:  Diagnosis Date  . Anxiety   . Arthritis    back of neck, bones spurs on neck  . Cervical disc disease   . Diabetes mellitus   . Hyperlipidemia   . Hypertension   . Neuropathy   . Reflux   . Sleep apnea    wears CPAP occasionally  . Vertigo     Past Surgical History:  Procedure Laterality Date  . BLADDER SURGERY    . BREAST SURGERY    . CHOLECYSTECTOMY    . fibroids removed     breast (both breasts)  . PARTIAL HYSTERECTOMY      There were no vitals filed for this visit.      Subjective Assessment - 06/02/16 1033    Subjective Patient reported doing well after last treatment   Pertinent History H/o neck and shoulder pain.  "Bone spurs."   Diagnostic tests X-ray and MRI.   Currently in Pain? Yes   Pain Score 2    Pain Location Shoulder   Pain Orientation Left   Pain Descriptors / Indicators Sore   Pain Type Chronic pain   Pain Onset More than a month ago   Pain Frequency Constant   Aggravating Factors  reaching and movement   Pain Relieving Factors rest and heat   Pain Score 4   Pain Location Neck   Pain Orientation Right;Left   Pain Descriptors / Indicators Aching   Pain Type Chronic pain   Pain Onset More than a month ago   Pain Frequency Intermittent   Aggravating Factors  movement    Pain Relieving Factors  rest and heat            [facility] PT Assessment - 06/02/16 0001      ROM / Strength   AROM / PROM / Strength AROM;PROM     AROM   AROM Assessment Site Shoulder   Right/Left Shoulder Left   Left Shoulder Flexion 110 Degrees                     [facility] Adult PT Treatment/Exercise - 06/02/16 0001      Shoulder Exercises: Pulleys   Flexion Other (comment)  13mn   Other Pulley Exercises Seated UE ranger flex/circles x5 min     Moist Heat Therapy   Number Minutes Moist Heat 10 Minutes   Moist Heat Location Shoulder;Cervical     Electrical Stimulation   Electrical Stimulation Location shoulder and c-spine IFC x 10 mins 80-150hz    Electrical Stimulation Goals Tone;Pain     Traction   Type of Traction Cervical   Min (lbs) 5   Max (lbs) 15   Hold Time 99   Rest Time 5   Time 15     Manual Therapy   Manual  Therapy Soft tissue mobilization   Soft tissue mobilization STW and TPR to bil c-spine and left UT Levator and post cap                PT Education - 06/02/16 1115    Education provided Yes   Education Details HEP   Person(s) Educated Patient   Methods Explanation;Demonstration;Handout   Comprehension Verbalized understanding;Returned demonstration          PT Short Term Goals - 06/02/16 1115      PT SHORT TERM GOAL #1   Title Ind with HEP.   Time 2   Period Weeks   Status Achieved     PT SHORT TERM GOAL #2   Title Patient will demonstrate an improvement of at least 10 degrees on all cervical planes with pain 0/10   Time 2   Period Weeks   Status On-going           PT Long Term Goals - 05/20/16 1130      PT LONG TERM GOAL #1   Title Perform ADL's with pain not > 3/10.   Time 8   Period Weeks   Status On-going     PT LONG TERM GOAL #2   Title Active left shoulder flexion to 145 degrees so the patient can easily reach overhead   Baseline 9/21- 11.3 at best    Time 8   Status On-going     PT LONG TERM GOAL #3   Title  Active ER to 65 degrees+ to allow for easily donning/doffing of apparel   Time 8   Period Weeks   Status On-going     PT LONG TERM GOAL #4   Title Reach behind back with left hand to L3.   Baseline Patient able to walk 2 miles, 5 days a week without pain   Time 8   Period Weeks   Status On-going     PT LONG TERM GOAL #5   Title Perform ADL's with pain not > 3/10.   Time 6   Period Weeks   Status New               Plan - 06/02/16 1106    Clinical Impression Statement Patient tolerated treatment well today. Patient has less palpable pain and less pain overall. Patient able to reach overhead with greaer ease. Patient improved ROM in left shoulde today. HEP given today for cervical and shoulder ROM. Met STG #1 other goals yet ongoing due to pain, ROM and strength deficits.    Rehab Potential Good   PT Frequency 2x / week   PT Duration 8 weeks   PT Treatment/Interventions ADLs/Self Care Home Management;Electrical Stimulation;Traction;Moist Heat;Therapeutic activities;Therapeutic exercise;Manual techniques;Patient/family education;Dry needling;Passive range of motion   PT Next Visit Plan cont with POC for Int traction; PROM to left shoulder and capsular stretching, pulleys, UE ranger and HEP to increase ER, STW/M.Marland KitchenMarland KitchenTP release technique affected musculature including left Biceps.      Patient will benefit from skilled therapeutic intervention in order to improve the following deficits and impairments:  Pain, Decreased activity tolerance, Decreased range of motion, Decreased strength, Postural dysfunction  Visit Diagnosis: Cervicalgia  Chronic left shoulder pain     Problem List Patient Active Problem List   Diagnosis Date Noted  . Adhesive capsulitis of left shoulder 03/20/2016  . Fatigue 12/16/2015  . Acute bronchitis 10/31/2015  . Urinary frequency 03/09/2015  . Dizziness and giddiness 08/09/2014  . Morbid obesity ([place]) 07/25/2014  .  Orthostatic hypotension  07/07/2014  . Upper airway cough syndrome 06/26/2014  . Diarrhea 05/02/2014  . Dizziness 04/12/2014  . Neuropathy   . Rash and nonspecific skin eruption 08/30/2013  . Paresthesia of both feet 08/30/2013  . Blurred vision, bilateral 02/15/2013  . Eye pain 02/15/2013  . Sinusitis, chronic 10/07/2012  . Left shoulder pain 04/15/2012  . Encounter for preventative adult health care exam with abnormal findings 10/22/2011  . Cervical disc disease 10/22/2011  . Bilateral hand pain 08/05/2011  . Reflux 06/29/2011  . Bloating 06/29/2011  . Back pain 03/13/2011  . Vertigo 01/04/2011  . Nystagmus 01/04/2011  . Insomnia 10/21/2010  . CONSTIPATION 08/17/2009  . VITAMIN D DEFICIENCY 05/15/2009  . DYSPNEA 03/13/2009  . Essential hypertension, benign 01/30/2009  . DEPRESSION 01/16/2009  . Irritable bowel syndrome 01/16/2009  . OSTEOARTHRITIS 12/15/2008  . Diabetes ([place]) 11/03/2008  . Hyperlipidemia 11/03/2008  . Anxiety state 11/03/2008  . OVERACTIVE BLADDER 11/03/2008  . OSA (obstructive sleep apnea) 11/03/2008  . MURMUR 11/03/2008    DUNFORD, CHRISTINA P, PTA 06/02/2016, 11:46 AM  [facility] [place], [place], [address] Phone: [phone]   Fax:  [phone]  Name: Laura [name] MRN: [id number] Date of Birth: 21-Jul-1945

## 2016-06-02 NOTE — Patient Instructions (Signed)
AROM: Neck Rotation   Turn head slowly to look over one shoulder, then the other. Hold each position _10___ seconds. Repeat _5___ times per set. Do __2__ sets per session. Do _2-3___ sessions per day.   AROM: Lateral Neck Flexion   Slowly tilt head toward one shoulder, then the other. Hold each position _10___ seconds. Repeat __5__ times per set. Do __2__ sets per session. Do __2-3__ sessions per day.   Stretch Break - Chin Tuck   Looking straight forward, tuck chin and hold __10__ seconds. Relax and return to starting position. Repeat __5-10__ times every _3-4___ hours.  Copyright  VHI. All rights reserved.  Stretch Break - Chest and Shoulder Stretch   Maintaining erect posture, draw shoulders back while bringing elbows back and inward. Return to starting position. Repeat __10-20__ times every _3-4___ hours.  ROM: External / Internal Rotation - Wand   Holding wand with left hand palm up, push out from body with other hand, palm down. Keep both elbows bent. When stretch is felt, hold _5___ seconds. Repeat to other side, leading with same hand. Keep elbows bent. Repeat __10__ times per set. Do __2-3__ sets per session. Do __2__ sessions per day.    ROM: Flexion - Wand (Supine)   Lie on back holding wand. Raise arms over head.  Repeat __10__ times per set. Do _2-3___ sets per session. Do _2___ sessions per day.

## 2016-06-05 ENCOUNTER — Encounter: Payer: Self-pay | Admitting: Physical Therapy

## 2016-06-05 ENCOUNTER — Ambulatory Visit: Payer: Medicare Other | Admitting: Physical Therapy

## 2016-06-05 DIAGNOSIS — M25512 Pain in left shoulder: Secondary | ICD-10-CM | POA: Diagnosis not present

## 2016-06-05 DIAGNOSIS — M47812 Spondylosis without myelopathy or radiculopathy, cervical region: Secondary | ICD-10-CM | POA: Diagnosis not present

## 2016-06-05 DIAGNOSIS — M25612 Stiffness of left shoulder, not elsewhere classified: Secondary | ICD-10-CM | POA: Diagnosis not present

## 2016-06-05 DIAGNOSIS — M542 Cervicalgia: Secondary | ICD-10-CM | POA: Diagnosis not present

## 2016-06-05 DIAGNOSIS — M25511 Pain in right shoulder: Secondary | ICD-10-CM | POA: Diagnosis not present

## 2016-06-05 DIAGNOSIS — M436 Torticollis: Secondary | ICD-10-CM | POA: Diagnosis not present

## 2016-06-05 DIAGNOSIS — G8929 Other chronic pain: Secondary | ICD-10-CM | POA: Diagnosis not present

## 2016-06-05 NOTE — Therapy (Signed)
[facility] [place], [place], [address] Phone: [phone]   Fax:  [phone]  Physical Therapy Treatment  Patient Details  Name: Laura [name] MRN: [id number] Date of Birth: [birth year] Referring Provider: Basil Dess, MD  Encounter Date: 06/05/2016      PT End of Session - 06/05/16 1055    Visit Number 8   Number of Visits 16   Date for PT Re-Evaluation 07/13/16   PT Start Time 1030   PT Stop Time 1122   PT Time Calculation (min) 52 min   Activity Tolerance Patient tolerated treatment well   Behavior During Therapy [facility] for tasks assessed/performed      Past Medical History:  Diagnosis Date  . Anxiety   . Arthritis    back of neck, bones spurs on neck  . Cervical disc disease   . Diabetes mellitus   . Hyperlipidemia   . Hypertension   . Neuropathy   . Reflux   . Sleep apnea    wears CPAP occasionally  . Vertigo     Past Surgical History:  Procedure Laterality Date  . BLADDER SURGERY    . BREAST SURGERY    . CHOLECYSTECTOMY    . fibroids removed     breast (both breasts)  . PARTIAL HYSTERECTOMY      There were no vitals filed for this visit.      Subjective Assessment - 06/05/16 1056    Subjective I'm doing better.   Pain Score 2    Pain Location Shoulder   Pain Orientation Left   Pain Descriptors / Indicators Sore   Pain Type Chronic pain   Pain Onset More than a month ago                         [facility] Adult PT Treatment/Exercise - 06/05/16 0001      Exercises   Exercises Shoulder     Shoulder Exercises: Pulleys   Flexion Limitations 5 minutes.   Other Pulley Exercises UE Ranger (standing) x 5 minutes.     Modalities   Modalities Electrical Stimulation     Moist Heat Therapy   Number Minutes Moist Heat 20 Minutes   Moist Heat Location --  Left affected shoulder region.     Acupuncturist Stimulation Location Left affected shoulder.   Electrical Stimulation Action Pre-mod   Electrical Stimulation Parameters 80-150 Hz 95 sec on and 5 sec off) x 20 minutes.   Electrical Stimulation Goals Tone;Pain     Traction   Type of Traction Cervical   Min (lbs) 5   Max (lbs) 15   Hold Time 99   Rest Time 5   Time 15                  PT Short Term Goals - 06/02/16 1115      PT SHORT TERM GOAL #1   Title Ind with HEP.   Time 2   Period Weeks   Status Achieved     PT SHORT TERM GOAL #2   Title Patient will demonstrate an improvement of at least 10 degrees on all cervical planes with pain 0/10   Time 2   Period Weeks   Status On-going           PT Long Term Goals - 05/20/16 1130      PT LONG TERM GOAL #1   Title Perform ADL's with pain not >  3/10.   Time 8   Period Weeks   Status On-going     PT LONG TERM GOAL #2   Title Active left shoulder flexion to 145 degrees so the patient can easily reach overhead   Baseline 9/21- 11.3 at best    Time 8   Status On-going     PT LONG TERM GOAL #3   Title Active ER to 65 degrees+ to allow for easily donning/doffing of apparel   Time 8   Period Weeks   Status On-going     PT LONG TERM GOAL #4   Title Reach behind back with left hand to L3.   Baseline Patient able to walk 2 miles, 5 days a week without pain   Time 8   Period Weeks   Status On-going     PT LONG TERM GOAL #5   Title Perform ADL's with pain not > 3/10.   Time 6   Period Weeks   Status New               Plan - 06/05/16 1217    Clinical Impression Statement Patient responding very well to treatments.      Patient will benefit from skilled therapeutic intervention in order to improve the following deficits and impairments:  Pain, Decreased activity tolerance, Decreased range of motion, Decreased strength, Postural dysfunction  Visit Diagnosis: Cervicalgia  Chronic left shoulder pain  Neck pain  Shoulder stiffness, left     Problem List Patient Active Problem  List   Diagnosis Date Noted  . Adhesive capsulitis of left shoulder 03/20/2016  . Fatigue 12/16/2015  . Acute bronchitis 10/31/2015  . Urinary frequency 03/09/2015  . Dizziness and giddiness 08/09/2014  . Morbid obesity ([place]) 07/25/2014  . Orthostatic hypotension 07/07/2014  . Upper airway cough syndrome 06/26/2014  . Diarrhea 05/02/2014  . Dizziness 04/12/2014  . Neuropathy   . Rash and nonspecific skin eruption 08/30/2013  . Paresthesia of both feet 08/30/2013  . Blurred vision, bilateral 02/15/2013  . Eye pain 02/15/2013  . Sinusitis, chronic 10/07/2012  . Left shoulder pain 04/15/2012  . Encounter for preventative adult health care exam with abnormal findings 10/22/2011  . Cervical disc disease 10/22/2011  . Bilateral hand pain 08/05/2011  . Reflux 06/29/2011  . Bloating 06/29/2011  . Back pain 03/13/2011  . Vertigo 01/04/2011  . Nystagmus 01/04/2011  . Insomnia 10/21/2010  . CONSTIPATION 08/17/2009  . VITAMIN D DEFICIENCY 05/15/2009  . DYSPNEA 03/13/2009  . Essential hypertension, benign 01/30/2009  . DEPRESSION 01/16/2009  . Irritable bowel syndrome 01/16/2009  . OSTEOARTHRITIS 12/15/2008  . Diabetes ([place]) 11/03/2008  . Hyperlipidemia 11/03/2008  . Anxiety state 11/03/2008  . OVERACTIVE BLADDER 11/03/2008  . OSA (obstructive sleep apnea) 11/03/2008  . MURMUR 11/03/2008    Laura [name], [place] MPT 06/05/2016, 12:19 PM  [facility] [place], [place], [address] Phone: [phone]   Fax:  [phone]  Name: Laura [name] MRN: [id number] Date of Birth: 08-06-1945

## 2016-06-10 ENCOUNTER — Ambulatory Visit: Payer: Medicare Other | Admitting: *Deleted

## 2016-06-10 DIAGNOSIS — G8929 Other chronic pain: Secondary | ICD-10-CM

## 2016-06-10 DIAGNOSIS — M25612 Stiffness of left shoulder, not elsewhere classified: Secondary | ICD-10-CM | POA: Diagnosis not present

## 2016-06-10 DIAGNOSIS — M25512 Pain in left shoulder: Secondary | ICD-10-CM | POA: Diagnosis not present

## 2016-06-10 DIAGNOSIS — M47812 Spondylosis without myelopathy or radiculopathy, cervical region: Secondary | ICD-10-CM | POA: Diagnosis not present

## 2016-06-10 DIAGNOSIS — M436 Torticollis: Secondary | ICD-10-CM | POA: Diagnosis not present

## 2016-06-10 DIAGNOSIS — M542 Cervicalgia: Secondary | ICD-10-CM | POA: Diagnosis not present

## 2016-06-10 DIAGNOSIS — M25511 Pain in right shoulder: Secondary | ICD-10-CM | POA: Diagnosis not present

## 2016-06-10 NOTE — Therapy (Signed)
[facility] [place], [place], [address] Phone: [phone]   Fax:  [phone]  Physical Therapy Treatment  Patient Details  Name: Laura [name] MRN: [id number] Date of Birth: 1945/06/06 Referring Provider: Basil Dess, MD  Encounter Date: 06/10/2016      PT End of Session - 06/10/16 1041    Visit Number 9   Number of Visits 16   Date for PT Re-Evaluation 07/13/16   Authorization Type Gcode 10th visit   PT Start Time 1032   PT Stop Time 1119   PT Time Calculation (min) 47 min      Past Medical History:  Diagnosis Date  . Anxiety   . Arthritis    back of neck, bones spurs on neck  . Cervical disc disease   . Diabetes mellitus   . Hyperlipidemia   . Hypertension   . Neuropathy   . Reflux   . Sleep apnea    wears CPAP occasionally  . Vertigo     Past Surgical History:  Procedure Laterality Date  . BLADDER SURGERY    . BREAST SURGERY    . CHOLECYSTECTOMY    . fibroids removed     breast (both breasts)  . PARTIAL HYSTERECTOMY      There were no vitals filed for this visit.      Subjective Assessment - 06/10/16 1038    Subjective I'm doing better.   Will see Dr Louanne Skye soon and will try to get both shldrs X-rayed   Pertinent History H/o neck and shoulder pain.  "Bone spurs."   Diagnostic tests X-ray and MRI.   Currently in Pain? Yes   Pain Score 3    Pain Location Shoulder   Pain Orientation Left   Pain Descriptors / Indicators Sore   Pain Type Chronic pain   Pain Frequency Constant                         [facility] Adult PT Treatment/Exercise - 06/10/16 0001      Exercises   Exercises Shoulder     Shoulder Exercises: Standing   External Rotation Strengthening;Left;Theraband  yellow 3x15   Internal Rotation Strengthening;Left  yellow 3x15     Shoulder Exercises: Pulleys   Flexion Limitations 5 minutes.   Other Pulley Exercises UE Ranger (standing) x 5 minutes.     Modalities   Modalities Electrical Stimulation     Moist Heat Therapy   Number Minutes Moist Heat 15 Minutes   Moist Heat Location Shoulder;Cervical     Electrical Stimulation   Electrical Stimulation Location shoulder and c-spine IFC x 15 mins 80-150hz    Electrical Stimulation Goals Tone;Pain     Traction   Type of Traction --   Min (lbs) --   Max (lbs) --   Hold Time --   Rest Time --   Time --     Manual Therapy   Manual Therapy Soft tissue mobilization   Soft tissue mobilization STW and TPR to left/ right  UT and  Levator                   PT Short Term Goals - 06/02/16 1115      PT SHORT TERM GOAL #1   Title Ind with HEP.   Time 2   Period Weeks   Status Achieved     PT SHORT TERM GOAL #2   Title Patient will demonstrate an improvement of at least  10 degrees on all cervical planes with pain 0/10   Time 2   Period Weeks   Status On-going           PT Long Term Goals - 05/20/16 1130      PT LONG TERM GOAL #1   Title Perform ADL's with pain not > 3/10.   Time 8   Period Weeks   Status On-going     PT LONG TERM GOAL #2   Title Active left shoulder flexion to 145 degrees so the patient can easily reach overhead   Baseline 9/21- 11.3 at best    Time 8   Status On-going     PT LONG TERM GOAL #3   Title Active ER to 65 degrees+ to allow for easily donning/doffing of apparel   Time 8   Period Weeks   Status On-going     PT LONG TERM GOAL #4   Title Reach behind back with left hand to L3.   Baseline Patient able to walk 2 miles, 5 days a week without pain   Time 8   Period Weeks   Status On-going     PT LONG TERM GOAL #5   Title Perform ADL's with pain not > 3/10.   Time 6   Period Weeks   Status New               Plan - 06/10/16 1113    Clinical Impression Statement Pt arrived to clinic today feeling better with less pain in neck and left shldr. She is able to raise LT UE easier with less pain now. Pt denied needing  traction after STW and heat and Estim. She did well with TPR and had releases in Both UTs   Rehab Potential Good   PT Frequency 2x / week   PT Duration 8 weeks   PT Treatment/Interventions ADLs/Self Care Home Management;Electrical Stimulation;Traction;Moist Heat;Therapeutic activities;Therapeutic exercise;Manual techniques;Patient/family education;Dry needling;Passive range of motion   PT Next Visit Plan cont with POC for Int traction; PROM to left shoulder and capsular stretching, pulleys, UE ranger and HEP to increase ER, STW/M.Marland KitchenMarland KitchenTP release technique affected musculature including left Biceps.   Consulted and Agree with Plan of Care Patient      Patient will benefit from skilled therapeutic intervention in order to improve the following deficits and impairments:  Pain, Decreased activity tolerance, Decreased range of motion, Decreased strength, Postural dysfunction  Visit Diagnosis: Cervicalgia  Chronic left shoulder pain  Neck pain     Problem List Patient Active Problem List   Diagnosis Date Noted  . Adhesive capsulitis of left shoulder 03/20/2016  . Fatigue 12/16/2015  . Acute bronchitis 10/31/2015  . Urinary frequency 03/09/2015  . Dizziness and giddiness 08/09/2014  . Morbid obesity ([place]) 07/25/2014  . Orthostatic hypotension 07/07/2014  . Upper airway cough syndrome 06/26/2014  . Diarrhea 05/02/2014  . Dizziness 04/12/2014  . Neuropathy   . Rash and nonspecific skin eruption 08/30/2013  . Paresthesia of both feet 08/30/2013  . Blurred vision, bilateral 02/15/2013  . Eye pain 02/15/2013  . Sinusitis, chronic 10/07/2012  . Left shoulder pain 04/15/2012  . Encounter for preventative adult health care exam with abnormal findings 10/22/2011  . Cervical disc disease 10/22/2011  . Bilateral hand pain 08/05/2011  . Reflux 06/29/2011  . Bloating 06/29/2011  . Back pain 03/13/2011  . Vertigo 01/04/2011  . Nystagmus 01/04/2011  . Insomnia 10/21/2010  . CONSTIPATION  08/17/2009  . VITAMIN D DEFICIENCY 05/15/2009  . DYSPNEA  03/13/2009  . Essential hypertension, benign 01/30/2009  . DEPRESSION 01/16/2009  . Irritable bowel syndrome 01/16/2009  . OSTEOARTHRITIS 12/15/2008  . Diabetes ([place]) 11/03/2008  . Hyperlipidemia 11/03/2008  . Anxiety state 11/03/2008  . OVERACTIVE BLADDER 11/03/2008  . OSA (obstructive sleep apnea) 11/03/2008  . MURMUR 11/03/2008    RAMSEUR,CHRIS, PTA 06/10/2016, 11:48 AM  [facility] [place], [place], [address] Phone: [phone]   Fax:  [phone]  Name: Laura [name] MRN: [id number] Date of Birth: Apr 29, 1945

## 2016-06-13 ENCOUNTER — Ambulatory Visit: Payer: Medicare Other | Attending: Specialist | Admitting: *Deleted

## 2016-06-13 DIAGNOSIS — M25512 Pain in left shoulder: Secondary | ICD-10-CM | POA: Insufficient documentation

## 2016-06-13 DIAGNOSIS — M542 Cervicalgia: Secondary | ICD-10-CM | POA: Diagnosis not present

## 2016-06-13 DIAGNOSIS — G8929 Other chronic pain: Secondary | ICD-10-CM

## 2016-06-13 DIAGNOSIS — M25612 Stiffness of left shoulder, not elsewhere classified: Secondary | ICD-10-CM | POA: Diagnosis not present

## 2016-06-13 NOTE — Therapy (Signed)
[facility] [place], [place], [address] Phone: [phone]   Fax:  [phone]  Physical Therapy Treatment  Patient Details  Name: Laura [name] MRN: [id number] Date of Birth: 1945/08/17 Referring Provider: Basil Dess, MD  Encounter Date: 06/13/2016      PT End of Session - 06/13/16 1037    Visit Number 10   Number of Visits 16   Date for PT Re-Evaluation 07/13/16   Authorization Type Gcode 10th visit   PT Start Time 1030   PT Stop Time 1121   PT Time Calculation (min) 51 min      Past Medical History:  Diagnosis Date  . Anxiety   . Arthritis    back of neck, bones spurs on neck  . Cervical disc disease   . Diabetes mellitus   . Hyperlipidemia   . Hypertension   . Neuropathy   . Reflux   . Sleep apnea    wears CPAP occasionally  . Vertigo     Past Surgical History:  Procedure Laterality Date  . BLADDER SURGERY    . BREAST SURGERY    . CHOLECYSTECTOMY    . fibroids removed     breast (both breasts)  . PARTIAL HYSTERECTOMY      There were no vitals filed for this visit.      Subjective Assessment - 06/13/16 1031    Subjective Not doing as well this morning. I had to use a HP on my neck and LT shldr last night due pain.   Pertinent History H/o neck and shoulder pain.  "Bone spurs."   Diagnostic tests X-ray and MRI.   Currently in Pain? Yes   Pain Score 4    Pain Location Shoulder   Pain Orientation Left   Pain Descriptors / Indicators Sore;Aching   Pain Type Chronic pain   Pain Onset More than a month ago                         [facility] Adult PT Treatment/Exercise - 06/13/16 0001      Shoulder Exercises: Pulleys   Flexion Limitations 5 minutes.   Other Pulley Exercises UE Ranger (standing) x 5 minutes. flexion and circles each way     Modalities   Modalities Electrical Stimulation     Moist Heat Therapy   Moist Heat Location Shoulder;Cervical     Electrical Stimulation    Electrical Stimulation Location shoulder and c-spine IFC x 15 mins 80-150hz    Electrical Stimulation Goals Tone;Pain     Traction   Type of Traction Cervical   Min (lbs) 5   Max (lbs) 15   Hold Time 99   Rest Time 5   Time 15     Manual Therapy   Manual Therapy Soft tissue mobilization   Soft tissue mobilization STW and TPR to left/ right  UT and  Levator in sitting                  PT Short Term Goals - 06/02/16 1115      PT SHORT TERM GOAL #1   Title Ind with HEP.   Time 2   Period Weeks   Status Achieved     PT SHORT TERM GOAL #2   Title Patient will demonstrate an improvement of at least 10 degrees on all cervical planes with pain 0/10   Time 2   Period Weeks   Status On-going  PT Long Term Goals - 05/20/16 1130      PT LONG TERM GOAL #1   Title Perform ADL's with pain not > 3/10.   Time 8   Period Weeks   Status On-going     PT LONG TERM GOAL #2   Title Active left shoulder flexion to 145 degrees so the patient can easily reach overhead   Baseline 9/21- 11.3 at best    Time 8   Status On-going     PT LONG TERM GOAL #3   Title Active ER to 65 degrees+ to allow for easily donning/doffing of apparel   Time 8   Period Weeks   Status On-going     PT LONG TERM GOAL #4   Title Reach behind back with left hand to L3.   Baseline Patient able to walk 2 miles, 5 days a week without pain   Time 8   Period Weeks   Status On-going     PT LONG TERM GOAL #5   Title Perform ADL's with pain not > 3/10.   Time 6   Period Weeks   Status New               Plan - 01-Jul-2016 1109    Clinical Impression Statement Pt arrived today with increased neck and shldr pain possibly from vacuuming and sweeping her house yesterday. She was able to perform Therex for her shldr with minimal pain increase, but was very sore to touch during STW in RT levator scap and LT lateral border of scapula. She did have good releases with TPR and STW. Gcode    FOTO 45% limited ROM LTGs NM due to deficits. LT shldr flexion to 110 degrees in standing and HBB to LT SIJ   Rehab Potential Good   PT Frequency 2x / week   PT Duration 8 weeks   PT Treatment/Interventions ADLs/Self Care Home Management;Electrical Stimulation;Traction;Moist Heat;Therapeutic activities;Therapeutic exercise;Manual techniques;Patient/family education;Dry needling;Passive range of motion   PT Next Visit Plan cont with POC for Int traction; PROM to left shoulder and capsular stretching, pulleys, UE ranger and HEP to increase ER, STW/M.Marland KitchenMarland KitchenTP release technique affected musculature including left Biceps.   Consulted and Agree with Plan of Care Patient      Patient will benefit from skilled therapeutic intervention in order to improve the following deficits and impairments:  Pain, Decreased activity tolerance, Decreased range of motion, Decreased strength, Postural dysfunction  Visit Diagnosis: Cervicalgia  Chronic left shoulder pain  Neck pain  Shoulder stiffness, left       G-Codes - 07/01/2016 1243    Functional Assessment Tool Used (Outpatient Only) 10th visit FOTO 45% limited   Functional Limitation Self care   Self Care Current Status (C5852) At least 40 percent but less than 60 percent impaired, limited or restricted   Self Care Goal Status (D7824) At least 20 percent but less than 40 percent impaired, limited or restricted      Problem List Patient Active Problem List   Diagnosis Date Noted  . Adhesive capsulitis of left shoulder 03/20/2016  . Fatigue 12/16/2015  . Acute bronchitis 10/31/2015  . Urinary frequency 03/09/2015  . Dizziness and giddiness 08/09/2014  . Morbid obesity ([place]) 07/25/2014  . Orthostatic hypotension 07/07/2014  . Upper airway cough syndrome 06/26/2014  . Diarrhea 05/02/2014  . Dizziness 04/12/2014  . Neuropathy   . Rash and nonspecific skin eruption 08/30/2013  . Paresthesia of both feet 08/30/2013  . Blurred vision, bilateral  02/15/2013  .  Eye pain 02/15/2013  . Sinusitis, chronic 10/07/2012  . Left shoulder pain 04/15/2012  . Encounter for preventative adult health care exam with abnormal findings 10/22/2011  . Cervical disc disease 10/22/2011  . Bilateral hand pain 08/05/2011  . Reflux 06/29/2011  . Bloating 06/29/2011  . Back pain 03/13/2011  . Vertigo 01/04/2011  . Nystagmus 01/04/2011  . Insomnia 10/21/2010  . CONSTIPATION 08/17/2009  . VITAMIN D DEFICIENCY 05/15/2009  . DYSPNEA 03/13/2009  . Essential hypertension, benign 01/30/2009  . DEPRESSION 01/16/2009  . Irritable bowel syndrome 01/16/2009  . OSTEOARTHRITIS 12/15/2008  . Diabetes ([place]) 11/03/2008  . Hyperlipidemia 11/03/2008  . Anxiety state 11/03/2008  . OVERACTIVE BLADDER 11/03/2008  . OSA (obstructive sleep apnea) 11/03/2008  . MURMUR 11/03/2008  **Patient treated by Levonne Lapping PTA on 06/13/16**  Martika Egler, [place] MPT 06/13/2016, 6:11 PM  [facility] [address] [place], [place], [address] Phone: [phone]   Fax:  [phone]  Name: YULA CROTWELL MRN: [id number] Date of Birth: [birth year]

## 2016-06-13 NOTE — Therapy (Deleted)
[facility] [place], [place], [address] Phone: [phone]   Fax:  [phone]  June 13, 2016   @CCLISTADDRESS @  Physical Therapy Discharge Summary  Patient: Laura [name]  MRN: [id number]  Date of Birth: 1945/03/03   Diagnosis: Cervicalgia  Chronic left shoulder pain  Neck pain  Shoulder stiffness, left Referring Provider: Basil Dess, MD  The above patient had been seen in Physical Therapy *** times of *** treatments scheduled with *** no shows and *** cancellations.  The treatment consisted of *** The patient is: {improved/worse/unchanged:3041574}  Subjective: ***  Discharge Findings: ***  Functional Status at Discharge: ***  {ESPQZ:3007622}      Plan - 06/13/16 1109    Clinical Impression Statement Pt arrived today with increased neck and shldr pain possibly from vacuuming and sweeping her house yesterday. She was able to perform Therex for her shldr with minimal pain increase, but was very sore to touch during STW in RT levator scap and LT lateral border of scapula. She did have good releases with TPR and STW. Gcode   FOTO 45% limited ROM LTGs NM due to deficits. LT shldr flexion to 110 degrees in standing and HBB to LT SIJ   Rehab Potential Good   PT Frequency 2x / week   PT Duration 8 weeks   PT Treatment/Interventions ADLs/Self Care Home Management;Electrical Stimulation;Traction;Moist Heat;Therapeutic activities;Therapeutic exercise;Manual techniques;Patient/family education;Dry needling;Passive range of motion   PT Next Visit Plan cont with POC for Int traction; PROM to left shoulder and capsular stretching, pulleys, UE ranger and HEP to increase ER, STW/M.Marland KitchenMarland KitchenTP release technique affected musculature including left Biceps.   Consulted and Agree with Plan of Care Patient      Sincerely,   APPLEGATE, [place], PT   CC @[email or web address] @  [facility] [place], [place], [address] Phone: [phone]   Fax:  [phone]  Patient: Laura [name]  MRN: [id number]  Date of Birth: 1945-04-08

## 2016-06-16 ENCOUNTER — Ambulatory Visit (INDEPENDENT_AMBULATORY_CARE_PROVIDER_SITE_OTHER): Payer: Medicare Other | Admitting: Specialist

## 2016-06-16 ENCOUNTER — Encounter (INDEPENDENT_AMBULATORY_CARE_PROVIDER_SITE_OTHER): Payer: Self-pay | Admitting: Specialist

## 2016-06-16 VITALS — BP 162/84 | HR 88 | Ht 64.0 in | Wt 198.0 lb

## 2016-06-16 DIAGNOSIS — M47812 Spondylosis without myelopathy or radiculopathy, cervical region: Secondary | ICD-10-CM

## 2016-06-16 DIAGNOSIS — M542 Cervicalgia: Secondary | ICD-10-CM

## 2016-06-16 DIAGNOSIS — M4712 Other spondylosis with myelopathy, cervical region: Secondary | ICD-10-CM

## 2016-06-16 DIAGNOSIS — M7502 Adhesive capsulitis of left shoulder: Secondary | ICD-10-CM

## 2016-06-16 MED ORDER — TRAMADOL-ACETAMINOPHEN 37.5-325 MG PO TABS: 1.0000 | ORAL_TABLET | ORAL | 0 refills | Status: DC | PRN

## 2016-06-16 NOTE — Progress Notes (Signed)
Office Visit Note   Patient: Laura [name]           Date of Birth: 1945/02/28           MRN: [id number] Visit Date: 06/16/2016              Requested by: Biagio Borg, MD [place], [place] [address] PCP: Biagio Borg, MD   Assessment & Plan: Visit Diagnoses:  1. Spondylosis without myelopathy or radiculopathy, cervical region   2. Adhesive capsulitis of left shoulder     Plan: Avoid overhead lifting and overhead use of the arms. Pillows to keep from sleeping directly on the shoulders Limited lifting to less than 10 lbs. Ice or heat for relief. NSAIDs are helpful, such as alleve or motrin, be careful not to use in excess as they place burdens on the kidney. Stretching exercise help and strengthening is helpful to build endurance. Over the door cervical spine traction unit, use 10-15lbs with the head and neck in slight flexion 10-15 degrees for 10-15 minutes Three of four times per day. TENS unit prescription. Arthritis strength tylenol 500-650 mg take 2-3 times per day. Realize that there is tylenol in the ultracet, limit combined meds to 6 tablets per day. Follow-Up Instructions: No Follow-up on file.   Orders:  No orders of the defined types were placed in this encounter.  No orders of the defined types were placed in this encounter.     Procedures: No procedures performed   Clinical Data: No additional findings.   Subjective: Chief Complaint  Patient presents with  . Neck - Follow-up    71 year old female, right handed, with spondylosis and left shoulder adhesive capsulitis. Seen at the [facility] PT and is undergoing ROM and left shoulder stretching and strengthening. Most of pain is into the left shoulder. Bilateral posterior neck pain. PT is also working on the posterior cervical spine. She is to have a right thumb mucinous cyst surgery by Dr. Fredna Dow in the next 2 weeks.     Review of Systems  HENT: Negative.   Eyes: Negative.     Respiratory: Negative.   Cardiovascular: Negative.   Gastrointestinal: Negative.   Endocrine: Negative.   Genitourinary: Negative.   Musculoskeletal: Positive for arthralgias, back pain, neck pain and neck stiffness.  Skin: Negative.   Allergic/Immunologic: Negative.   Neurological: Positive for weakness and numbness.  Hematological: Bruises/bleeds easily.  Psychiatric/Behavioral: Negative for agitation, behavioral problems, confusion, decreased concentration, dysphoric mood, hallucinations, self-injury, sleep disturbance and suicidal ideas. The patient is not nervous/anxious and is not hyperactive.      Objective: Vital Signs: BP (!) 162/84 (BP Location: Left Arm, Patient Position: Sitting)   Pulse 88   Ht 5\' 4"  (1.626 m)   Wt 198 lb (89.8 kg)   BMI 33.99 kg/m   Physical Exam  Constitutional: She is oriented to person, place, and time. She appears well-developed and well-nourished.  HENT:  Head: Normocephalic and atraumatic.  Eyes: EOM are normal. Pupils are equal, round, and reactive to light.  Neck: Normal range of motion. Neck supple.  Pulmonary/Chest: Effort normal and breath sounds normal.  Abdominal: Soft. Bowel sounds are normal.  Musculoskeletal: Normal range of motion.  Neurological: She is alert and oriented to person, place, and time.  Skin: Skin is warm and dry.  Psychiatric: She has a normal mood and affect. Her behavior is normal. Judgment and thought content normal.    Back Exam  Tenderness  The patient is experiencing tenderness in the cervical.  Other  Toe Walk: normal Heel Walk: normal Gait: normal  Erythema: no back redness Scars: absent  Comments:  Decreased left cervical spine ROM      Specialty Comments:  No specialty comments available.  Imaging: No results found.   PMFS History: Patient Active Problem List   Diagnosis Date Noted  . Adhesive capsulitis of left shoulder 03/20/2016  . Fatigue 12/16/2015  . Acute bronchitis  10/31/2015  . Urinary frequency 03/09/2015  . Dizziness and giddiness 08/09/2014  . Morbid obesity ([place]) 07/25/2014  . Orthostatic hypotension 07/07/2014  . Upper airway cough syndrome 06/26/2014  . Diarrhea 05/02/2014  . Dizziness 04/12/2014  . Neuropathy   . Rash and nonspecific skin eruption 08/30/2013  . Paresthesia of both feet 08/30/2013  . Blurred vision, bilateral 02/15/2013  . Eye pain 02/15/2013  . Sinusitis, chronic 10/07/2012  . Left shoulder pain 04/15/2012  . Encounter for preventative adult health care exam with abnormal findings 10/22/2011  . Cervical disc disease 10/22/2011  . Bilateral hand pain 08/05/2011  . Reflux 06/29/2011  . Bloating 06/29/2011  . Back pain 03/13/2011  . Vertigo 01/04/2011  . Nystagmus 01/04/2011  . Insomnia 10/21/2010  . CONSTIPATION 08/17/2009  . VITAMIN D DEFICIENCY 05/15/2009  . DYSPNEA 03/13/2009  . Essential hypertension, benign 01/30/2009  . DEPRESSION 01/16/2009  . Irritable bowel syndrome 01/16/2009  . OSTEOARTHRITIS 12/15/2008  . Diabetes ([place]) 11/03/2008  . Hyperlipidemia 11/03/2008  . Anxiety state 11/03/2008  . OVERACTIVE BLADDER 11/03/2008  . OSA (obstructive sleep apnea) 11/03/2008  . MURMUR 11/03/2008   Past Medical History:  Diagnosis Date  . Anxiety   . Arthritis    back of neck, bones spurs on neck  . Cervical disc disease   . Diabetes mellitus   . Hyperlipidemia   . Hypertension   . Neuropathy   . Reflux   . Sleep apnea    wears CPAP occasionally  . Vertigo     Family History  Problem Relation Age of Onset  . Hypertension Other   . Diabetes Father   . Diabetes Sister   . Diabetes Maternal Aunt   . Colon cancer Neg Hx   . Esophageal cancer Neg Hx   . Stomach cancer Neg Hx   . Rectal cancer Neg Hx     Past Surgical History:  Procedure Laterality Date  . BLADDER SURGERY    . BREAST SURGERY    . CHOLECYSTECTOMY    . fibroids removed     breast (both breasts)  . PARTIAL HYSTERECTOMY      Social History   Occupational History  . 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 Smoking status: Never Smoker  . Smokeless tobacco: Never Used  . Alcohol use No  . Drug use: No  . Sexual activity: Not Currently

## 2016-06-16 NOTE — Patient Instructions (Addendum)
Avoid overhead lifting and overhead use of the arms. Pillows to keep from sleeping directly on the shoulders Limited lifting to less than 10 lbs. Ice or heat for relief. NSAIDs are helpful, such as alleve or motrin, be careful not to use in excess as they place burdens on the kidney. Stretching exercise help and strengthening is helpful to build endurance. Over the door cervical spine traction unit, use 10-15lbs with the head and neck in slight flexion 10-15 degrees for 10-15 minutes Three of four times per day. TENS unit prescription. Arthritis strength tylenol 500-650 mg take 2-3 times per day. Realize that there is tylenol in the ultracet, limit combined meds to 6 tablets per day.

## 2016-06-17 ENCOUNTER — Ambulatory Visit: Payer: Medicare Other | Admitting: Physical Therapy

## 2016-06-17 ENCOUNTER — Encounter: Payer: Self-pay | Admitting: Physical Therapy

## 2016-06-17 DIAGNOSIS — M542 Cervicalgia: Secondary | ICD-10-CM

## 2016-06-17 DIAGNOSIS — G8929 Other chronic pain: Secondary | ICD-10-CM

## 2016-06-17 DIAGNOSIS — M25512 Pain in left shoulder: Secondary | ICD-10-CM | POA: Diagnosis not present

## 2016-06-17 DIAGNOSIS — M25612 Stiffness of left shoulder, not elsewhere classified: Secondary | ICD-10-CM | POA: Diagnosis not present

## 2016-06-17 NOTE — Therapy (Signed)
[facility] [place], [place], [address] Phone: [phone]   Fax:  [phone]  Physical Therapy Treatment  Patient Details  Name: Laura [name] MRN: [id number] Date of Birth: [birth year] Referring Provider: Basil Dess, MD  Encounter Date: 06/17/2016      PT End of Session - 06/17/16 1045    Visit Number 11   Number of Visits 16   Date for PT Re-Evaluation 07/13/16   Authorization Type Gcode 10th visit   PT Start Time 1041   PT Stop Time 1132   PT Time Calculation (min) 51 min   Activity Tolerance Patient tolerated treatment well   Behavior During Therapy [facility] for tasks assessed/performed      Past Medical History:  Diagnosis Date  . Anxiety   . Arthritis    back of neck, bones spurs on neck  . Cervical disc disease   . Diabetes mellitus   . Hyperlipidemia   . Hypertension   . Neuropathy   . Reflux   . Sleep apnea    wears CPAP occasionally  . Vertigo     Past Surgical History:  Procedure Laterality Date  . BLADDER SURGERY    . BREAST SURGERY    . CHOLECYSTECTOMY    . fibroids removed     breast (both breasts)  . PARTIAL HYSTERECTOMY      There were no vitals filed for this visit.      Subjective Assessment - 06/17/16 1043    Subjective Reports that last night she woke up with intense and widespread pain in posterior shoulder and in her neck. States that she took extra strength tylenol and used ice. Reports that Dr. Louanne Skye wants her to get a home traction unit   Pertinent History H/o neck and shoulder pain.  "Bone spurs."   Diagnostic tests X-ray and MRI.   Currently in Pain? Yes   Pain Score 5    Pain Location Shoulder   Pain Orientation Left   Pain Descriptors / Indicators Sore   Pain Type Chronic pain   Pain Onset More than a month ago   Pain Score 2   Pain Location Neck   Pain Orientation Right;Left   Pain Descriptors / Indicators Discomfort;Pressure   Pain Type Chronic pain   Pain  Onset More than a month ago            [facility] PT Assessment - 06/17/16 0001      Assessment   Medical Diagnosis neck and left shoulder   Next MD Visit 07/14/2016     Precautions   Precautions None     Restrictions   Weight Bearing Restrictions No                     [facility] Adult PT Treatment/Exercise - 06/17/16 0001      Shoulder Exercises: Pulleys   Flexion Other (comment)  x5 min     Modalities   Modalities Electrical Stimulation;Moist Heat;Traction     Moist Heat Therapy   Number Minutes Moist Heat 15 Minutes   Moist Heat Location Shoulder;Cervical     Electrical Stimulation   Electrical Stimulation Location L UT, shoulder region   Electrical Stimulation Action IFC   Electrical Stimulation Parameters 80-150 hz x15 min   Electrical Stimulation Goals Pain     Traction   Type of Traction Cervical   Min (lbs) 5   Max (lbs) 15   Hold Time 99   Rest Time 5  Time 15     Manual Therapy   Manual Therapy Soft tissue mobilization   Soft tissue mobilization STW to L UT to reduce tone and pain                  PT Short Term Goals - 06/02/16 1115      PT SHORT TERM GOAL #1   Title Ind with HEP.   Time 2   Period Weeks   Status Achieved     PT SHORT TERM GOAL #2   Title Patient will demonstrate an improvement of at least 10 degrees on all cervical planes with pain 0/10   Time 2   Period Weeks   Status On-going           PT Long Term Goals - 05/20/16 1130      PT LONG TERM GOAL #1   Title Perform ADL's with pain not > 3/10.   Time 8   Period Weeks   Status On-going     PT LONG TERM GOAL #2   Title Active left shoulder flexion to 145 degrees so the patient can easily reach overhead   Baseline 9/21- 11.3 at best    Time 8   Status On-going     PT LONG TERM GOAL #3   Title Active ER to 65 degrees+ to allow for easily donning/doffing of apparel   Time 8   Period Weeks   Status On-going     PT LONG TERM GOAL #4   Title  Reach behind back with left hand to L3.   Baseline Patient able to walk 2 miles, 5 days a week without pain   Time 8   Period Weeks   Status On-going     PT LONG TERM GOAL #5   Title Perform ADL's with pain not > 3/10.   Time 6   Period Weeks   Status New               Plan - 06/17/16 1159    Clinical Impression Statement Patient arrived to treatment today with reports of being awakened in the night with pain in cervical and L posterior shoulder region that felt as if it were spreading. Patient reported soreness since the episode this morning. Patient arrived with tone of the L UT and cervical paraspinals with soreness reported with manual therapy from patient. Patient experienced good cervical spine response with cervical traction but reported by the end of the treatment that muscle spasms were beginning in L shoulder region. Electrical stimulation and moist heat completed in sitting per patient request and as she reported beginning to have a pain in forehead region. Patient experienced pain relief in L shoulder region upon end of treatment today.   Rehab Potential Good   PT Frequency 2x / week   PT Duration 8 weeks   PT Treatment/Interventions ADLs/Self Care Home Management;Electrical Stimulation;Traction;Moist Heat;Therapeutic activities;Therapeutic exercise;Manual techniques;Patient/family education;Dry needling;Passive range of motion   PT Next Visit Plan cont with POC for Int traction; PROM to left shoulder and capsular stretching, pulleys, UE ranger and HEP to increase ER, STW/M.Marland KitchenMarland KitchenTP release technique affected musculature including left Biceps.   Consulted and Agree with Plan of Care Patient      Patient will benefit from skilled therapeutic intervention in order to improve the following deficits and impairments:  Pain, Decreased activity tolerance, Decreased range of motion, Decreased strength, Postural dysfunction  Visit Diagnosis: Cervicalgia  Chronic left shoulder  pain     Problem  List Patient Active Problem List   Diagnosis Date Noted  . Adhesive capsulitis of left shoulder 03/20/2016  . Fatigue 12/16/2015  . Acute bronchitis 10/31/2015  . Urinary frequency 03/09/2015  . Dizziness and giddiness 08/09/2014  . Morbid obesity ([place]) 07/25/2014  . Orthostatic hypotension 07/07/2014  . Upper airway cough syndrome 06/26/2014  . Diarrhea 05/02/2014  . Dizziness 04/12/2014  . Neuropathy   . Rash and nonspecific skin eruption 08/30/2013  . Paresthesia of both feet 08/30/2013  . Blurred vision, bilateral 02/15/2013  . Eye pain 02/15/2013  . Sinusitis, chronic 10/07/2012  . Left shoulder pain 04/15/2012  . Encounter for preventative adult health care exam with abnormal findings 10/22/2011  . Cervical disc disease 10/22/2011  . Bilateral hand pain 08/05/2011  . Reflux 06/29/2011  . Bloating 06/29/2011  . Back pain 03/13/2011  . Vertigo 01/04/2011  . Nystagmus 01/04/2011  . Insomnia 10/21/2010  . CONSTIPATION 08/17/2009  . VITAMIN D DEFICIENCY 05/15/2009  . DYSPNEA 03/13/2009  . Essential hypertension, benign 01/30/2009  . DEPRESSION 01/16/2009  . Irritable bowel syndrome 01/16/2009  . OSTEOARTHRITIS 12/15/2008  . Diabetes ([place]) 11/03/2008  . Hyperlipidemia 11/03/2008  . Anxiety state 11/03/2008  . OVERACTIVE BLADDER 11/03/2008  . OSA (obstructive sleep apnea) 11/03/2008  . MURMUR 11/03/2008    Wynelle Fanny, PTA 06/17/2016, 12:07 PM  [facility] [place], [place], [address] Phone: [phone]   Fax:  [phone]  Name: MAKENZYE TROUTMAN MRN: [id number] Date of Birth: 03-Nov-1945

## 2016-06-20 ENCOUNTER — Ambulatory Visit: Payer: Medicare Other | Admitting: *Deleted

## 2016-06-20 DIAGNOSIS — M25612 Stiffness of left shoulder, not elsewhere classified: Secondary | ICD-10-CM

## 2016-06-20 DIAGNOSIS — M25512 Pain in left shoulder: Secondary | ICD-10-CM

## 2016-06-20 DIAGNOSIS — M542 Cervicalgia: Secondary | ICD-10-CM

## 2016-06-20 DIAGNOSIS — G8929 Other chronic pain: Secondary | ICD-10-CM

## 2016-06-20 NOTE — Therapy (Signed)
[facility] [place], [place], [address] Phone: [phone]   Fax:  [phone]  Physical Therapy Treatment  Patient Details  Name: Laura [name] MRN: [id number] Date of Birth: [birth year] Referring Provider: Basil Dess, MD  Encounter Date: 06/20/2016      PT End of Session - 06/20/16 1049    Visit Number 12   Number of Visits 16   Date for PT Re-Evaluation 07/13/16   Authorization Type Gcode 10th visit   PT Start Time 1040   PT Stop Time 1130   PT Time Calculation (min) 50 min      Past Medical History:  Diagnosis Date  . Anxiety   . Arthritis    back of neck, bones spurs on neck  . Cervical disc disease   . Diabetes mellitus   . Hyperlipidemia   . Hypertension   . Neuropathy   . Reflux   . Sleep apnea    wears CPAP occasionally  . Vertigo     Past Surgical History:  Procedure Laterality Date  . BLADDER SURGERY    . BREAST SURGERY    . CHOLECYSTECTOMY    . fibroids removed     breast (both breasts)  . PARTIAL HYSTERECTOMY      There were no vitals filed for this visit.      Subjective Assessment - 06/20/16 1046    Subjective went to seee Dr Louanne Skye on Monday and he diagnosed me with central stenosis and wants me to get a TENS unit and over the door traction. I don't think I want the traction   Pertinent History H/o neck and shoulder pain.  "Bone spurs."   Diagnostic tests X-ray and MRI.   Currently in Pain? Yes   Pain Location Shoulder   Pain Orientation Left   Pain Descriptors / Indicators Sore   Pain Type Chronic pain   Pain Onset More than a month ago   Pain Frequency Constant                         [facility] Adult PT Treatment/Exercise - 06/20/16 0001      Shoulder Exercises: Pulleys   Flexion Other (comment)  x5 min   Other Pulley Exercises UE Ranger (standing) x 5 minutes. flexion and circles each way     Modalities   Modalities Electrical Stimulation;Moist  Heat;Traction     Moist Heat Therapy   Number Minutes Moist Heat 15 Minutes   Moist Heat Location Shoulder;Cervical     Electrical Stimulation   Electrical Stimulation Location shoulder and c-spine IFC x 15 mins 80-150hz    Electrical Stimulation Goals Pain     Traction   Type of Traction Cervical   Min (lbs) 5   Max (lbs) 15   Hold Time 99   Rest Time 5   Time 15     Manual Therapy   Manual Therapy Soft tissue mobilization   Soft tissue mobilization STW and TPR to left/ right  UT and  Levator in sitting,as well as LT infraspinatus area.                  PT Short Term Goals - 06/02/16 1115      PT SHORT TERM GOAL #1   Title Ind with HEP.   Time 2   Period Weeks   Status Achieved     PT SHORT TERM GOAL #2   Title Patient will demonstrate an improvement of  at least 10 degrees on all cervical planes with pain 0/10   Time 2   Period Weeks   Status On-going           PT Long Term Goals - 05/20/16 1130      PT LONG TERM GOAL #1   Title Perform ADL's with pain not > 3/10.   Time 8   Period Weeks   Status On-going     PT LONG TERM GOAL #2   Title Active left shoulder flexion to 145 degrees so the patient can easily reach overhead   Baseline 9/21- 11.3 at best    Time 8   Status On-going     PT LONG TERM GOAL #3   Title Active ER to 65 degrees+ to allow for easily donning/doffing of apparel   Time 8   Period Weeks   Status On-going     PT LONG TERM GOAL #4   Title Reach behind back with left hand to L3.   Baseline Patient able to walk 2 miles, 5 days a week without pain   Time 8   Period Weeks   Status On-going     PT LONG TERM GOAL #5   Title Perform ADL's with pain not > 3/10.   Time 6   Period Weeks   Status New               Plan - 06/20/16 1230    Clinical Impression Statement Pt arrived to clinic today feeling better than last time. She reports that Dr Louanne Skye states that she has central stenosis in her neck and wants her to  get a TENs unit and over the door traction, but she doesn't feel comfortable with the traction unit.She did well with therex for :LT shldr today with minimal increase in pain. She was sore and tight with a TP in LT shldr blade infraspinatus fossa area with good release. She also had  good release in RT UT as well. cervical traction was performed at 15 #s again as per Pt's wishes.   Rehab Potential Good   PT Frequency 2x / week   PT Duration 8 weeks   PT Treatment/Interventions ADLs/Self Care Home Management;Electrical Stimulation;Traction;Moist Heat;Therapeutic activities;Therapeutic exercise;Manual techniques;Patient/family education;Dry needling;Passive range of motion   PT Next Visit Plan cont with POC for Int traction; PROM to left shoulder and capsular stretching, pulleys, UE ranger and HEP to increase ER, STW/M.Marland KitchenMarland KitchenTP release technique affected musculature including left Biceps.   Consulted and Agree with Plan of Care Patient      Patient will benefit from skilled therapeutic intervention in order to improve the following deficits and impairments:  Pain, Decreased activity tolerance, Decreased range of motion, Decreased strength, Postural dysfunction  Visit Diagnosis: Cervicalgia  Chronic left shoulder pain  Neck pain  Shoulder stiffness, left     Problem List Patient Active Problem List   Diagnosis Date Noted  . Adhesive capsulitis of left shoulder 03/20/2016  . Fatigue 12/16/2015  . Acute bronchitis 10/31/2015  . Urinary frequency 03/09/2015  . Dizziness and giddiness 08/09/2014  . Morbid obesity ([place]) 07/25/2014  . Orthostatic hypotension 07/07/2014  . Upper airway cough syndrome 06/26/2014  . Diarrhea 05/02/2014  . Dizziness 04/12/2014  . Neuropathy   . Rash and nonspecific skin eruption 08/30/2013  . Paresthesia of both feet 08/30/2013  . Blurred vision, bilateral 02/15/2013  . Eye pain 02/15/2013  . Sinusitis, chronic 10/07/2012  . Left shoulder pain 04/15/2012   . Encounter for preventative  adult health care exam with abnormal findings 10/22/2011  . Cervical disc disease 10/22/2011  . Bilateral hand pain 08/05/2011  . Reflux 06/29/2011  . Bloating 06/29/2011  . Back pain 03/13/2011  . Vertigo 01/04/2011  . Nystagmus 01/04/2011  . Insomnia 10/21/2010  . CONSTIPATION 08/17/2009  . VITAMIN D DEFICIENCY 05/15/2009  . DYSPNEA 03/13/2009  . Essential hypertension, benign 01/30/2009  . DEPRESSION 01/16/2009  . Irritable bowel syndrome 01/16/2009  . OSTEOARTHRITIS 12/15/2008  . Diabetes ([place]) 11/03/2008  . Hyperlipidemia 11/03/2008  . Anxiety state 11/03/2008  . OVERACTIVE BLADDER 11/03/2008  . OSA (obstructive sleep apnea) 11/03/2008  . MURMUR 11/03/2008    Matricia Begnaud,CHRIS, PTA 06/20/2016, 12:39 PM  [facility] [place], [place], [address] Phone: [phone]   Fax:  [phone]  Name: Laura [name] MRN: [id number] Date of Birth: 01/04/1946

## 2016-06-23 ENCOUNTER — Encounter (HOSPITAL_BASED_OUTPATIENT_CLINIC_OR_DEPARTMENT_OTHER): Payer: Self-pay | Admitting: *Deleted

## 2016-06-23 ENCOUNTER — Ambulatory Visit: Payer: Medicare Other | Admitting: *Deleted

## 2016-06-23 DIAGNOSIS — G8929 Other chronic pain: Secondary | ICD-10-CM

## 2016-06-23 DIAGNOSIS — M25612 Stiffness of left shoulder, not elsewhere classified: Secondary | ICD-10-CM

## 2016-06-23 DIAGNOSIS — M542 Cervicalgia: Secondary | ICD-10-CM | POA: Diagnosis not present

## 2016-06-23 DIAGNOSIS — M25512 Pain in left shoulder: Secondary | ICD-10-CM | POA: Diagnosis not present

## 2016-06-23 NOTE — Therapy (Signed)
[facility] [place], [place], [address] Phone: [phone]   Fax:  [phone]  Physical Therapy Treatment  Patient Details  Name: Laura [name] MRN: [id number] Date of Birth: [birth year] Referring Provider: Basil Dess, MD  Encounter Date: 06/23/2016      PT End of Session - 06/23/16 1138    Visit Number 13   Number of Visits 16   Date for PT Re-Evaluation 07/13/16   Authorization Type Gcode 10th visit   PT Start Time 0350   PT Stop Time 1208   PT Time Calculation (min) 33 min      Past Medical History:  Diagnosis Date  . Anxiety   . Arthritis    back of neck, bones spurs on neck  . Cervical disc disease   . Diabetes mellitus   . Hyperlipidemia   . Hypertension   . Mucoid cyst of joint    right thumb  . Neuropathy   . Reflux   . Sleep apnea    wears CPAP nightly  . Vertigo     Past Surgical History:  Procedure Laterality Date  . BLADDER SURGERY    . BREAST SURGERY    . CHOLECYSTECTOMY    . fibroids removed     breast (both breasts)  . PARTIAL HYSTERECTOMY      There were no vitals filed for this visit.      Subjective Assessment - 06/23/16 1159    Subjective  Pt 19 mins late.  Would like STW and traction still if possible.  Having thumb surgery Thursday so I might miss some visits   Pertinent History H/o neck and shoulder pain.  "Bone spurs."   Diagnostic tests X-ray and MRI.   Currently in Pain? Yes   Pain Score 6    Pain Location Shoulder   Pain Orientation Left   Pain Descriptors / Indicators Sore   Pain Type Chronic pain   Pain Onset More than a month ago                         [facility] Adult PT Treatment/Exercise - 06/23/16 0001      Modalities   Modalities Electrical Stimulation;Moist Heat;Traction     Traction   Type of Traction Cervical   Max (lbs) 12-15   Time 10     Manual Therapy   Manual Therapy Soft tissue mobilization   Soft tissue mobilization STW  and TPR to left/ right  UT and  Levator in sitting,as well as LT infraspinatus area.                  PT Short Term Goals - 06/02/16 1115      PT SHORT TERM GOAL #1   Title Ind with HEP.   Time 2   Period Weeks   Status Achieved     PT SHORT TERM GOAL #2   Title Patient will demonstrate an improvement of at least 10 degrees on all cervical planes with pain 0/10   Time 2   Period Weeks   Status On-going           PT Long Term Goals - 05/20/16 1130      PT LONG TERM GOAL #1   Title Perform ADL's with pain not > 3/10.   Time 8   Period Weeks   Status On-going     PT LONG TERM GOAL #2   Title Active left shoulder flexion to  145 degrees so the patient can easily reach overhead   Baseline 9/21- 11.3 at best    Time 8   Status On-going     PT LONG TERM GOAL #3   Title Active ER to 65 degrees+ to allow for easily donning/doffing of apparel   Time 8   Period Weeks   Status On-going     PT LONG TERM GOAL #4   Title Reach behind back with left hand to L3.   Baseline Patient able to walk 2 miles, 5 days a week without pain   Time 8   Period Weeks   Status On-going     PT LONG TERM GOAL #5   Title Perform ADL's with pain not > 3/10.   Time 6   Period Weeks   Status New               Plan - 06/23/16 1206    Clinical Impression Statement Pt arrived to clinic 19 mins late. STW was performed to Bil. UTs and levators with multiple TP releases. Cervical traction was performed in static x10 min 12-15 #s and tolerated well.  Pt to have thumb surgery this  Thursday.   PT Frequency 2x / week   PT Duration 8 weeks   PT Treatment/Interventions ADLs/Self Care Home Management;Electrical Stimulation;Traction;Moist Heat;Therapeutic activities;Therapeutic exercise;Manual techniques;Patient/family education;Dry needling;Passive range of motion   PT Next Visit Plan cont with POC for Int traction; PROM to left shoulder and capsular stretching, pulleys, UE ranger and  HEP to increase ER, STW/M.Marland KitchenMarland KitchenTP release technique affected musculature including left Biceps.   Consulted and Agree with Plan of Care Patient      Patient will benefit from skilled therapeutic intervention in order to improve the following deficits and impairments:  Pain, Decreased activity tolerance, Decreased range of motion, Decreased strength, Postural dysfunction  Visit Diagnosis: Cervicalgia  Chronic left shoulder pain  Neck pain  Shoulder stiffness, left     Problem List Patient Active Problem List   Diagnosis Date Noted  . Adhesive capsulitis of left shoulder 03/20/2016  . Fatigue 12/16/2015  . Acute bronchitis 10/31/2015  . Urinary frequency 03/09/2015  . Dizziness and giddiness 08/09/2014  . Morbid obesity ([place]) 07/25/2014  . Orthostatic hypotension 07/07/2014  . Upper airway cough syndrome 06/26/2014  . Diarrhea 05/02/2014  . Dizziness 04/12/2014  . Neuropathy   . Rash and nonspecific skin eruption 08/30/2013  . Paresthesia of both feet 08/30/2013  . Blurred vision, bilateral 02/15/2013  . Eye pain 02/15/2013  . Sinusitis, chronic 10/07/2012  . Left shoulder pain 04/15/2012  . Encounter for preventative adult health care exam with abnormal findings 10/22/2011  . Cervical disc disease 10/22/2011  . Bilateral hand pain 08/05/2011  . Reflux 06/29/2011  . Bloating 06/29/2011  . Back pain 03/13/2011  . Vertigo 01/04/2011  . Nystagmus 01/04/2011  . Insomnia 10/21/2010  . CONSTIPATION 08/17/2009  . VITAMIN D DEFICIENCY 05/15/2009  . DYSPNEA 03/13/2009  . Essential hypertension, benign 01/30/2009  . DEPRESSION 01/16/2009  . Irritable bowel syndrome 01/16/2009  . OSTEOARTHRITIS 12/15/2008  . Diabetes ([place]) 11/03/2008  . Hyperlipidemia 11/03/2008  . Anxiety state 11/03/2008  . OVERACTIVE BLADDER 11/03/2008  . OSA (obstructive sleep apnea) 11/03/2008  . MURMUR 11/03/2008    Terrianna Holsclaw,CHRIS, PTA 06/23/2016, 12:17 PM  [facility] [place], [place], [address] Phone: [phone]   Fax:  [phone]  Name: Laura [name] MRN: [id number] Date of Birth: 10-05-1945

## 2016-06-24 ENCOUNTER — Ambulatory Visit (INDEPENDENT_AMBULATORY_CARE_PROVIDER_SITE_OTHER): Payer: 59 | Admitting: Psychiatry

## 2016-06-24 ENCOUNTER — Encounter (HOSPITAL_BASED_OUTPATIENT_CLINIC_OR_DEPARTMENT_OTHER)
Admission: RE | Admit: 2016-06-24 | Discharge: 2016-06-24 | Disposition: A | Discharge status: Home / Self Care | Payer: Medicare Other | Source: Ambulatory Visit | Attending: Orthopedic Surgery | Admitting: Orthopedic Surgery

## 2016-06-24 DIAGNOSIS — G4730 Sleep apnea, unspecified: Secondary | ICD-10-CM | POA: Diagnosis not present

## 2016-06-24 DIAGNOSIS — E119 Type 2 diabetes mellitus without complications: Secondary | ICD-10-CM | POA: Diagnosis not present

## 2016-06-24 DIAGNOSIS — Z79899 Other long term (current) drug therapy: Secondary | ICD-10-CM | POA: Diagnosis not present

## 2016-06-24 DIAGNOSIS — E785 Hyperlipidemia, unspecified: Secondary | ICD-10-CM | POA: Diagnosis not present

## 2016-06-24 DIAGNOSIS — F330 Major depressive disorder, recurrent, mild: Secondary | ICD-10-CM | POA: Diagnosis not present

## 2016-06-24 DIAGNOSIS — K219 Gastro-esophageal reflux disease without esophagitis: Secondary | ICD-10-CM | POA: Diagnosis not present

## 2016-06-24 DIAGNOSIS — M1990 Unspecified osteoarthritis, unspecified site: Secondary | ICD-10-CM | POA: Diagnosis not present

## 2016-06-24 DIAGNOSIS — M67441 Ganglion, right hand: Secondary | ICD-10-CM | POA: Diagnosis not present

## 2016-06-24 DIAGNOSIS — I10 Essential (primary) hypertension: Secondary | ICD-10-CM | POA: Diagnosis not present

## 2016-06-24 DIAGNOSIS — Z7984 Long term (current) use of oral hypoglycemic drugs: Secondary | ICD-10-CM | POA: Diagnosis not present

## 2016-06-24 DIAGNOSIS — F419 Anxiety disorder, unspecified: Secondary | ICD-10-CM | POA: Diagnosis not present

## 2016-06-24 LAB — BASIC METABOLIC PANEL
ANION GAP: 6 (ref 5–15)
BUN: 10 mg/dL (ref 6–20)
CALCIUM: 10.1 mg/dL (ref 8.9–10.3)
CO2: 24 mmol/L (ref 22–32)
Chloride: 109 mmol/L (ref 101–111)
Creatinine, Ser: 0.63 mg/dL (ref 0.44–1.00)
GFR calc Af Amer: >60 mL/min (ref >60)
Glucose, Bld: 73 mg/dL (ref 65–99)
POTASSIUM: 4.7 mmol/L (ref 3.5–5.1)
SODIUM: 139 mmol/L (ref 135–145)

## 2016-06-25 ENCOUNTER — Ambulatory Visit: Payer: 59 | Admitting: Clinical

## 2016-06-26 ENCOUNTER — Ambulatory Visit (HOSPITAL_BASED_OUTPATIENT_CLINIC_OR_DEPARTMENT_OTHER): Payer: Medicare Other | Admitting: Anesthesiology

## 2016-06-26 ENCOUNTER — Ambulatory Visit (HOSPITAL_BASED_OUTPATIENT_CLINIC_OR_DEPARTMENT_OTHER)
Admission: RE | Admit: 2016-06-26 | Discharge: 2016-06-26 | Disposition: A | Discharge status: Home / Self Care | Payer: Medicare Other | Source: Ambulatory Visit | Attending: Orthopedic Surgery | Admitting: Orthopedic Surgery

## 2016-06-26 ENCOUNTER — Encounter (HOSPITAL_BASED_OUTPATIENT_CLINIC_OR_DEPARTMENT_OTHER)
Admission: RE | Disposition: A | Discharge status: Home / Self Care | Payer: Self-pay | Source: Ambulatory Visit | Attending: Orthopedic Surgery

## 2016-06-26 ENCOUNTER — Encounter (HOSPITAL_BASED_OUTPATIENT_CLINIC_OR_DEPARTMENT_OTHER): Payer: Self-pay

## 2016-06-26 DIAGNOSIS — M67441 Ganglion, right hand: Secondary | ICD-10-CM | POA: Insufficient documentation

## 2016-06-26 DIAGNOSIS — E119 Type 2 diabetes mellitus without complications: Secondary | ICD-10-CM | POA: Insufficient documentation

## 2016-06-26 DIAGNOSIS — I10 Essential (primary) hypertension: Secondary | ICD-10-CM | POA: Insufficient documentation

## 2016-06-26 DIAGNOSIS — G4730 Sleep apnea, unspecified: Secondary | ICD-10-CM | POA: Insufficient documentation

## 2016-06-26 DIAGNOSIS — K219 Gastro-esophageal reflux disease without esophagitis: Secondary | ICD-10-CM | POA: Diagnosis not present

## 2016-06-26 DIAGNOSIS — Z7984 Long term (current) use of oral hypoglycemic drugs: Secondary | ICD-10-CM | POA: Insufficient documentation

## 2016-06-26 DIAGNOSIS — Z79899 Other long term (current) drug therapy: Secondary | ICD-10-CM | POA: Diagnosis not present

## 2016-06-26 DIAGNOSIS — E785 Hyperlipidemia, unspecified: Secondary | ICD-10-CM | POA: Insufficient documentation

## 2016-06-26 DIAGNOSIS — M1990 Unspecified osteoarthritis, unspecified site: Secondary | ICD-10-CM | POA: Diagnosis not present

## 2016-06-26 DIAGNOSIS — F419 Anxiety disorder, unspecified: Secondary | ICD-10-CM | POA: Insufficient documentation

## 2016-06-26 DIAGNOSIS — M19041 Primary osteoarthritis, right hand: Secondary | ICD-10-CM | POA: Diagnosis not present

## 2016-06-26 DIAGNOSIS — D4989 Neoplasm of unspecified behavior of other specified sites: Secondary | ICD-10-CM | POA: Diagnosis not present

## 2016-06-26 HISTORY — PX: MASS EXCISION: SHX2000

## 2016-06-26 HISTORY — DX: Ganglion, unspecified site: M67.40

## 2016-06-26 LAB — GLUCOSE, CAPILLARY
Glucose-Capillary: 134 mg/dL — ABNORMAL HIGH (ref 65–99)
Glucose-Capillary: 120 mg/dL — ABNORMAL HIGH (ref 65–99)

## 2016-06-26 SURGERY — EXCISION MASS
Anesthesia: Regional | Site: Hand | Laterality: Right

## 2016-06-26 MED ORDER — ONDANSETRON HCL 4 MG/2ML IJ SOLN
INTRAMUSCULAR | Status: DC | PRN
  Administered 2016-06-26: 4 mg via INTRAVENOUS

## 2016-06-26 MED ORDER — CLINDAMYCIN PHOSPHATE 900 MG/50ML IV SOLN
INTRAVENOUS | Status: AC
  Filled 2016-06-26: qty 50

## 2016-06-26 MED ORDER — LACTATED RINGERS IV SOLN
INTRAVENOUS | Status: DC
  Administered 2016-06-26: 12:00:00 via INTRAVENOUS

## 2016-06-26 MED ORDER — FENTANYL CITRATE (PF) 100 MCG/2ML IJ SOLN
INTRAMUSCULAR | Status: AC
  Filled 2016-06-26: qty 2

## 2016-06-26 MED ORDER — CLINDAMYCIN PHOSPHATE 900 MG/50ML IV SOLN
900.0000 mg | INTRAVENOUS | Status: AC
  Administered 2016-06-26: 900 mg via INTRAVENOUS

## 2016-06-26 MED ORDER — FENTANYL CITRATE (PF) 100 MCG/2ML IJ SOLN
50.0000 ug | INTRAMUSCULAR | Status: AC | PRN
  Administered 2016-06-26 (×4): 25 ug via INTRAVENOUS

## 2016-06-26 MED ORDER — LIDOCAINE HCL (CARDIAC) 20 MG/ML IV SOLN
INTRAVENOUS | Status: DC | PRN
  Administered 2016-06-26: 20 mg via INTRAVENOUS

## 2016-06-26 MED ORDER — ONDANSETRON HCL 4 MG/2ML IJ SOLN
INTRAMUSCULAR | Status: AC
  Filled 2016-06-26: qty 2

## 2016-06-26 MED ORDER — DEXAMETHASONE SODIUM PHOSPHATE 10 MG/ML IJ SOLN
INTRAMUSCULAR | Status: AC
  Filled 2016-06-26: qty 1

## 2016-06-26 MED ORDER — MIDAZOLAM HCL 2 MG/2ML IJ SOLN: 1.0000 mg | INTRAMUSCULAR | Status: DC | PRN

## 2016-06-26 MED ORDER — BUPIVACAINE HCL (PF) 0.25 % IJ SOLN
INTRAMUSCULAR | Status: DC | PRN
  Administered 2016-06-26: 7 mL

## 2016-06-26 MED ORDER — LIDOCAINE 2% (20 MG/ML) 5 ML SYRINGE
INTRAMUSCULAR | Status: AC
  Filled 2016-06-26: qty 5

## 2016-06-26 MED ORDER — LIDOCAINE HCL (PF) 0.5 % IJ SOLN
INTRAMUSCULAR | Status: DC | PRN
  Administered 2016-06-26: 35 mL via INTRAVENOUS

## 2016-06-26 MED ORDER — SCOPOLAMINE 1 MG/3DAYS TD PT72: 1.0000 | MEDICATED_PATCH | Freq: Once | TRANSDERMAL | Status: DC | PRN

## 2016-06-26 MED ORDER — PROPOFOL 10 MG/ML IV BOLUS
INTRAVENOUS | Status: DC | PRN
  Administered 2016-06-26: 10 mg via INTRAVENOUS
  Administered 2016-06-26 (×2): 20 mg via INTRAVENOUS

## 2016-06-26 MED ORDER — CHLORHEXIDINE GLUCONATE 4 % EX LIQD: 60.0000 mL | Freq: Once | CUTANEOUS | Status: DC

## 2016-06-26 SURGICAL SUPPLY — 45 items
BANDAGE COBAN STERILE 2 (GAUZE/BANDAGES/DRESSINGS) IMPLANT
BLADE SURG 15 STRL LF DISP TIS (BLADE) ×1 IMPLANT
BLADE SURG 15 STRL SS (BLADE) ×2
BNDG COHESIVE 1X5 TAN STRL LF (GAUZE/BANDAGES/DRESSINGS) IMPLANT
BNDG COHESIVE 3X5 TAN STRL LF (GAUZE/BANDAGES/DRESSINGS) IMPLANT
BNDG ESMARK 4X9 LF (GAUZE/BANDAGES/DRESSINGS) ×3 IMPLANT
BNDG GAUZE ELAST 4 BULKY (GAUZE/BANDAGES/DRESSINGS) IMPLANT
CHLORAPREP W/TINT 26ML (MISCELLANEOUS) ×3 IMPLANT
CORDS BIPOLAR (ELECTRODE) ×3 IMPLANT
COVER BACK TABLE 60X90IN (DRAPES) ×3 IMPLANT
COVER MAYO STAND STRL (DRAPES) ×3 IMPLANT
CUFF TOURNIQUET SINGLE 18IN (TOURNIQUET CUFF) ×3 IMPLANT
DECANTER SPIKE VIAL GLASS SM (MISCELLANEOUS) IMPLANT
DRAIN PENROSE 1/2X12 LTX STRL (WOUND CARE) IMPLANT
DRAPE EXTREMITY T 121X128X90 (DRAPE) ×3 IMPLANT
DRAPE SURG 17X23 STRL (DRAPES) ×3 IMPLANT
GAUZE SPONGE 4X4 12PLY STRL (GAUZE/BANDAGES/DRESSINGS) ×3 IMPLANT
GAUZE XEROFORM 1X8 LF (GAUZE/BANDAGES/DRESSINGS) ×3 IMPLANT
GLOVE BIO SURGEON STRL SZ 6.5 (GLOVE) ×2 IMPLANT
GLOVE BIO SURGEONS STRL SZ 6.5 (GLOVE) ×1
GLOVE BIOGEL PI IND STRL 7.0 (GLOVE) ×2 IMPLANT
GLOVE BIOGEL PI IND STRL 8.5 (GLOVE) ×1 IMPLANT
GLOVE BIOGEL PI INDICATOR 7.0 (GLOVE) ×4
GLOVE BIOGEL PI INDICATOR 8.5 (GLOVE) ×2
GLOVE SURG ORTHO 8.0 STRL STRW (GLOVE) ×3 IMPLANT
GOWN STRL REUS W/ TWL LRG LVL3 (GOWN DISPOSABLE) ×1 IMPLANT
GOWN STRL REUS W/TWL LRG LVL3 (GOWN DISPOSABLE) ×2
GOWN STRL REUS W/TWL XL LVL3 (GOWN DISPOSABLE) ×3 IMPLANT
NDL SAFETY ECLIPSE 18X1.5 (NEEDLE) IMPLANT
NEEDLE HYPO 18GX1.5 SHARP (NEEDLE)
NEEDLE PRECISIONGLIDE 27X1.5 (NEEDLE) ×3 IMPLANT
NS IRRIG 1000ML POUR BTL (IV SOLUTION) ×3 IMPLANT
PACK BASIN DAY SURGERY FS (CUSTOM PROCEDURE TRAY) ×3 IMPLANT
PAD CAST 3X4 CTTN HI CHSV (CAST SUPPLIES) IMPLANT
PADDING CAST COTTON 3X4 STRL (CAST SUPPLIES)
SPLINT FINGER 3.25 BULB 911905 (SOFTGOODS) ×3 IMPLANT
SPLINT PLASTER CAST XFAST 3X15 (CAST SUPPLIES) IMPLANT
SPLINT PLASTER XTRA FASTSET 3X (CAST SUPPLIES)
STOCKINETTE 4X48 STRL (DRAPES) ×3 IMPLANT
SUT ETHILON 4 0 PS 2 18 (SUTURE) ×3 IMPLANT
SUT VIC AB 4-0 P2 18 (SUTURE) IMPLANT
SYR BULB 3OZ (MISCELLANEOUS) ×3 IMPLANT
SYR CONTROL 10ML LL (SYRINGE) ×3 IMPLANT
TOWEL OR 17X24 6PK STRL BLUE (TOWEL DISPOSABLE) ×3 IMPLANT
UNDERPAD 30X30 (UNDERPADS AND DIAPERS) ×3 IMPLANT

## 2016-06-26 NOTE — Transfer of Care (Signed)
Immediate Anesthesia Transfer of Care Note  Patient: Laura [name]  Procedure(s) Performed: Procedure(s): EXCISION MUCOID TUMOR RIGHT THUMB, IP RIGHT THUMB (Right)  Patient Location: PACU  Anesthesia Type:MAC and Bier block  Level of Consciousness: awake, alert  and oriented  Airway & Oxygen Therapy: Patient Spontanous Breathing  Post-op Assessment: Report given to RN and Post -op Vital signs reviewed and stable  Post vital signs: Reviewed and stable  Last Vitals:  Vitals:   06/26/16 1122  BP: (!) 147/76  Pulse: 82  Resp: 18  Temp: 36.7 C    Last Pain:  Vitals:   06/26/16 1122  TempSrc: Oral  PainSc: 3       Patients Stated Pain Goal: 3 (15/04/13 6438)  Complications: No apparent anesthesia complications

## 2016-06-26 NOTE — Anesthesia Procedure Notes (Signed)
Procedure Name: MAC Performed by: Terrance Mass Pre-anesthesia Checklist: Patient identified, Timeout performed, Emergency Drugs available, Suction available and Patient being monitored Oxygen Delivery Method: Simple face mask

## 2016-06-26 NOTE — Discharge Instructions (Addendum)

## 2016-06-26 NOTE — Op Note (Signed)
Dictation Number [id number]

## 2016-06-26 NOTE — Anesthesia Preprocedure Evaluation (Addendum)
Anesthesia Evaluation  Patient identified by MRN, date of birth, ID band Patient awake    Reviewed: Allergy & Precautions, NPO status , Patient's Chart, lab work & pertinent test results  Airway Mallampati: II  TM Distance: >3 FB Neck ROM: Full    Dental no notable dental hx.    Pulmonary sleep apnea and Continuous Positive Airway Pressure Ventilation ,    Pulmonary exam normal breath sounds clear to auscultation       Cardiovascular hypertension, Pt. on medications Normal cardiovascular exam Rhythm:Regular Rate:Normal  ECG: SR, rate 79  ECHO: - Left ventricle: The cavity size was normal. Systolic function was normal. The estimated ejection fraction was in the range of 55% to 60%. Wall motion was normal; there were no regional wall motion abnormalities. - Left atrium: The atrium was mildly dilated. - Right atrium: The atrium was mildly dilated. - Atrial septum: No defect or patent foramen ovale was identified. - Pulmonary arteries: PA peak pressure: 56 mm Hg (S).   Neuro/Psych Anxiety negative neurological ROS     GI/Hepatic negative GI ROS, Neg liver ROS,   Endo/Other  diabetes, Type 2, Oral Hypoglycemic Agents  Renal/GU negative Renal ROS  negative genitourinary   Musculoskeletal negative musculoskeletal ROS (+)   Abdominal (+) + obese,   Peds negative pediatric ROS (+)  Hematology negative hematology ROS (+)   Anesthesia Other Findings Hyperlipidemia  Vertigo Obese   Reproductive/Obstetrics negative OB ROS                            Anesthesia Physical Anesthesia Plan  ASA: III  Anesthesia Plan: Bier Block   Post-op Pain Management:    Induction: Intravenous  PONV Risk Score and Plan: 2 and Ondansetron and Propofol  Airway Management Planned:   Additional Equipment:   Intra-op Plan:   Post-operative Plan:   Informed Consent: I have reviewed the patients History and  Physical, chart, labs and discussed the procedure including the risks, benefits and alternatives for the proposed anesthesia with the patient or authorized representative who has indicated his/her understanding and acceptance.   Dental advisory given  Plan Discussed with: CRNA  Anesthesia Plan Comments:        Anesthesia Quick Evaluation

## 2016-06-26 NOTE — Anesthesia Procedure Notes (Signed)
Anesthesia Regional Block: Bier block (IV Regional)   Pre-Anesthetic Checklist: ,, timeout performed, Correct Patient, Correct Site, Correct Laterality, Correct Procedure,, site marked, surgical consent,, at surgeon's request Needles:  Injection technique: Single-shot  Needle Type: Other      Needle Gauge: 20     Additional Needles:   Procedures:,,,,,,, Esmarch exsanguination, single tourniquet utilized,  Narrative:   Performed by: Personally

## 2016-06-26 NOTE — Anesthesia Postprocedure Evaluation (Signed)
Anesthesia Post Note  Patient: Laura [name]  Procedure(s) Performed: Procedure(s) (LRB): EXCISION MUCOID TUMOR RIGHT THUMB, IP RIGHT THUMB (Right)     Patient location during evaluation: PACU Anesthesia Type: Bier Block Level of consciousness: awake and alert Pain management: pain level controlled Vital Signs Assessment: post-procedure vital signs reviewed and stable Respiratory status: spontaneous breathing, nonlabored ventilation, respiratory function stable and patient connected to nasal cannula oxygen Cardiovascular status: stable and blood pressure returned to baseline Anesthetic complications: no    Last Vitals:  Vitals:   06/26/16 1345 06/26/16 1423  BP: (!) 155/71 135/75  Pulse: 72 73  Resp: 14 20  Temp: 36.5 C 36.6 C    Last Pain:  Vitals:   06/26/16 1122  TempSrc: Oral  PainSc: 3                  Omero Kowal P Raekwan Spelman

## 2016-06-26 NOTE — Op Note (Signed)
NAME:  Laura [name], Laura [name] NO.:  [id number]  MEDICAL RECORD NO.:  [id number]  LOCATION:                                 FACILITY:  PHYSICIAN:  Daryll Brod, M.D.            DATE OF BIRTH:  DATE OF PROCEDURE:  06/26/2016 DATE OF DISCHARGE:                              OPERATIVE REPORT   PREOPERATIVE DIAGNOSIS:  Mucoid cyst with degenerative arthritis, interphalangeal joint, right thumb.  POSTOPERATIVE DIAGNOSIS:  Mucoid cyst with degenerative arthritis, interphalangeal joint, right thumb.  TITLE OF PROCEDURE:  Excision of mucoid cyst; debridement of interphalangeal joint, right thumb with excision of osteophyte proximal phalanx.  SURGEON:  Daryll Brod, MD.  ANESTHESIA:  Forearm-based IV regional with metacarpal block and IV sedation.  PLACE OF SURGERY:  [facility].  HISTORY:  The patient is a 71 year old female with a history of a mass over the IP joint of her right thumb.  This does transilluminate.  X- rays revealed degenerative changes in the interphalangeal joint.  She is admitted for excision and debridement of the joint.  Pre, peri, and postoperative course have been discussed along with the patient along with risks and complications.  She is aware that there is no guarantee to the surgery, the possibility of infection; recurrence of injury to arteries, nerves, tendons; incomplete relief of symptoms; dystrophy. The possibility of stiffness to the joint secondary to the arthritis. In the preoperative area, the patient is marked, antibiotic given.  PROCEDURE IN DETAIL:  The patient was brought to the operating room, where a forearm-based IV regional anesthetic was carried out without difficulty under the direction of the Anesthesia Department.  She was prepped using ChloraPrep in supine position with the right arm free.  A 3-minute dry time was allowed and a time-out taken, confirming the patient and procedure.  A curvilinear incision was made  over the interphalangeal joint of the thumb, carried down on her radial border. This was carried down through subcutaneous tissue.  Bleeders were electrocauterized with bipolar.  The cyst was immediately encountered. With blunt and sharp dissection, it was dissected free, sent to Pathology.  The joint was opened on the radial border of the extensor tendon.  The joint was entered.  The synovial tissue was excised with a hemostatic rongeur and osteophytes removed from the proximal phalanx. The wound was copiously irrigated with saline.  The specimen was sent to Pathology.  The wound was then closed with interrupted 4-0 nylon sutures.  At the beginning of the procedure, a metacarpal block was given with 0.25% bupivacaine without epinephrine, 7 mL was used.  A sterile compressive dressing splint was applied. On deflation of the tourniquet, remaining fingers turned pink.  She was taken to the recovery room for observation in satisfactory condition. She will be discharged to home to return to [place] in 1 week.  She has Toradol to take.          ______________________________ Daryll Brod, M.D.     GK/MEDQ  D:  06/26/2016  T:  06/26/2016  Job:  [id number]

## 2016-06-26 NOTE — H&P (Signed)
Laura [name] is an 71 y.o. female.   Chief Complaint: mass right thumb HPI: Laura [name]. She is referred by Dr. Louanne Skye for consultation regarding a mass on the dorsal aspect of the IP joint of her right thumb. She states this been present for at least one year. She is not complaining any pain or discomfort. She has had no injury. It has not been opened. She also states sustained a fall yesterday onto her left hand and has a small skin avulsion PIP joint level of the index finger dorsally. She has a history of diabetes and arthritis she has no history of thyroid problems or gout. Family history is negative for diabetes thyroid problems arthritis but positive for gout.      Past Medical History:  Diagnosis Date  . Anxiety   . Arthritis    back of neck, bones spurs on neck  . Cervical disc disease   . Diabetes mellitus   . Hyperlipidemia   . Hypertension   . Mucoid cyst of joint    right thumb  . Neuropathy   . Reflux   . Sleep apnea    wears CPAP nightly  . Vertigo     Past Surgical History:  Procedure Laterality Date  . BLADDER SURGERY    . BREAST SURGERY    . CHOLECYSTECTOMY    . fibroids removed     breast (both breasts)  . PARTIAL HYSTERECTOMY      Family History  Problem Relation Age of Onset  . Hypertension Other   . Diabetes Father   . Diabetes Sister   . Diabetes Maternal Aunt   . Colon cancer Neg Hx   . Esophageal cancer Neg Hx   . Stomach cancer Neg Hx   . Rectal cancer Neg Hx    Social History:  reports that she has never smoked. She has never used smokeless tobacco. She reports that she does not drink alcohol or use drugs.  Allergies:  Allergies  Allergen Reactions  . Sulfa Antibiotics     Tongue swells, hives, itching  . Codeine     itch  . Crestor [Rosuvastatin Calcium] Other (See Comments)    Did something to memory   . Hydrocodone-Homatropine  Other (See Comments)    Vertigo *pt strongly prefers to never take*  . Keflex [Cephalexin] Diarrhea and Nausea And Vomiting  . Lipitor [Atorvastatin Calcium] Other (See Comments)    Makes weak   . Naproxen Other (See Comments)    Stomach cramps  . Prednisone Other (See Comments)    Nervous *pt strongly prefers to never be given prednisone*     Medications Prior to Admission  Medication Sig Dispense Refill  . acetaminophen (TYLENOL) 325 MG tablet Take 325 mg by mouth as needed for mild pain, moderate pain, fever or headache.     Marland Kitchen amLODipine (NORVASC) 5 MG tablet TAKE ONE (1) TABLET BY MOUTH EVERY DAY 90 tablet 3  . glimepiride (AMARYL) 2 MG tablet Take 0.5 tablets (1 mg total) by mouth daily before breakfast. 45 tablet 3  . loperamide (IMODIUM A-D) 2 MG tablet Take 2 mg by mouth daily as needed for diarrhea or loose stools.     . traMADol-acetaminophen (ULTRACET) 37.5-325 MG tablet Take 1 tablet by mouth every 4 (four) hours as needed for moderate pain. 30 tablet 0  . valsartan (DIOVAN) 160 MG tablet Take 1 tablet (  160 mg total) by mouth daily. 90 tablet 3  . Blood Glucose Monitoring Suppl (ONE TOUCH ULTRA 2) w/Device KIT Use as directed 1 each 0  . ONE TOUCH ULTRA TEST test strip USE TO CHECK BLOOD SUGARS DAILY 50 each 3  . ONETOUCH DELICA LANCETS 09M MISC USE TO CHECK BLOOD SUGAR TWICE A DAY 100 each 3    Results for orders placed or performed during the hospital encounter of 06/26/16 (from the past 48 hour(s))  Basic metabolic panel     Status: None   Collection Time: 06/24/16  2:59 PM  Result Value Ref Range   Sodium 139 135 - 145 mmol/L   Potassium 4.7 3.5 - 5.1 mmol/L   Chloride 109 101 - 111 mmol/L   CO2 24 22 - 32 mmol/L   Glucose, Bld 73 65 - 99 mg/dL   BUN 10 6 - 20 mg/dL   Creatinine, Ser 0.63 0.44 - 1.00 mg/dL   Calcium 10.1 8.9 - 10.3 mg/dL   GFR calc non Af Amer >60 >60 mL/min   GFR calc Af Amer >60 >60 mL/min    Comment: (NOTE) The eGFR has been calculated  using the CKD EPI equation. This calculation has not been validated in all clinical situations. eGFR's persistently <60 mL/min signify possible Chronic Kidney Disease.    Anion gap 6 5 - 15  Glucose, capillary     Status: Abnormal   Collection Time: 06/26/16 11:43 AM  Result Value Ref Range   Glucose-Capillary 134 (H) 65 - 99 mg/dL    No results found.   Pertinent items are noted in HPI.  Blood pressure (!) 147/76, pulse 82, temperature 98.1 F (36.7 C), temperature source Oral, resp. rate 18, height 5' 4"  (1.626 m), weight 93 kg (205 lb 2 oz), SpO2 99 %.  General appearance: alert, cooperative and appears stated age Head: Normocephalic, without obvious abnormality Neck: no JVD Resp: clear to auscultation bilaterally Cardio: regular rate and rhythm, S1, S2 normal, no murmur, click, rub or gallop GI: soft, non-tender; bowel sounds normal; no masses,  no organomegaly Extremities: mass right thumb Pulses: 2+ and symmetric Skin: Skin color, texture, turgor normal. No rashes or lesions Neurologic: Grossly normal Incision/Wound: na  Assessment/Plan Assessment:  1. Pain in finger of right hand Right (thumb) 2. Mucoid cyst, joint  Right (thumb) 3. Left hand pain  4. Primary osteoarthritis of both first carpometacarpal joints  5. Primary osteoarthritis of both hands  6. Skin avulsion    Plan: Discussed the etiology of the mucoid cyst with her. We recommend surgical excision along with debridement of the joint. Pre-peri-and postoperative course are discussed along with risk complications. She is aware that there is no guarantee to the surgery the possibility of infection recurrence injury to arteries nerves tendons incomplete relief symptoms dystrophy. The skin evulsion on her left index finger is cleaned with peroxide Band-Aid is applied. She is advised to use Neosporin on this. He would like to proceed she is scheduled for excision of mucoid cyst debridement interphalangeal joint  right thumb as an outpatient under regional anesthesia. She is aware of the possibility of recurrence.      Laura [name] 06/26/2016, 12:13 PM

## 2016-06-26 NOTE — Brief Op Note (Signed)
06/26/2016  1:18 PM  PATIENT:  Laura [name]  71 y.o. female  PRE-OPERATIVE DIAGNOSIS:  MUCOID TUMOR RIGHT THUMB, DJD IP THUMB  POST-OPERATIVE DIAGNOSIS:  MUCOID TUMOR RIGHT THUMB, DJD IP THUMB  PROCEDURE:  Procedure(s): EXCISION MUCOID TUMOR RIGHT THUMB, IP RIGHT THUMB (Right)  SURGEON:  Surgeon(s) and Role:    * Daryll Brod, MD - Primary  PHYSICIAN ASSISTANT:   ASSISTANTS: none   ANESTHESIA:   local and regional  EBL:  Total I/O In: 600 [I.V.:600] Out: 1 [Blood:1]  BLOOD ADMINISTERED:none  DRAINS: none   LOCAL MEDICATIONS USED:  BUPIVICAINE   SPECIMEN: excision  DISPOSITION OF SPECIMEN:  PATHOLOGY  COUNTS:  YES  TOURNIQUET:   Total Tourniquet Time Documented: Forearm (Right) - 20 minutes Total: Forearm (Right) - 20 minutes   DICTATION: .Other Dictation: Dictation Number [id number]  PLAN OF CARE: Discharge to home after PACU  PATIENT DISPOSITION:  PACU - hemodynamically stable.

## 2016-06-27 ENCOUNTER — Encounter (HOSPITAL_BASED_OUTPATIENT_CLINIC_OR_DEPARTMENT_OTHER): Payer: Self-pay | Admitting: Orthopedic Surgery

## 2016-07-01 ENCOUNTER — Encounter: Payer: Self-pay | Admitting: Gastroenterology

## 2016-07-11 ENCOUNTER — Ambulatory Visit: Payer: Medicare Other | Admitting: *Deleted

## 2016-07-11 DIAGNOSIS — M542 Cervicalgia: Secondary | ICD-10-CM

## 2016-07-11 DIAGNOSIS — M25612 Stiffness of left shoulder, not elsewhere classified: Secondary | ICD-10-CM | POA: Diagnosis not present

## 2016-07-11 DIAGNOSIS — M25512 Pain in left shoulder: Secondary | ICD-10-CM

## 2016-07-11 DIAGNOSIS — G8929 Other chronic pain: Secondary | ICD-10-CM | POA: Diagnosis not present

## 2016-07-11 NOTE — Therapy (Signed)
[facility] [place], [place], [address] Phone: [phone]   Fax:  [phone]  Physical Therapy Treatment  Patient Details  Name: Laura [name] MRN: [id number] Date of Birth: 1945/06/04 Referring Provider: Basil Dess, MD  Encounter Date: 07/11/2016      PT End of Session - 07/11/16 1037    Visit Number 14   Number of Visits 16   Date for PT Re-Evaluation 07/13/16   Authorization Type Gcode 10th visit   PT Start Time 1031   PT Stop Time 1121   PT Time Calculation (min) 50 min      Past Medical History:  Diagnosis Date  . Anxiety   . Arthritis    back of neck, bones spurs on neck  . Cervical disc disease   . Diabetes mellitus   . Hyperlipidemia   . Hypertension   . Mucoid cyst of joint    right thumb  . Neuropathy   . Reflux   . Sleep apnea    wears CPAP nightly  . Vertigo     Past Surgical History:  Procedure Laterality Date  . BLADDER SURGERY    . BREAST SURGERY    . CHOLECYSTECTOMY    . fibroids removed     breast (both breasts)  . MASS EXCISION Right 06/26/2016   Procedure: EXCISION MUCOID TUMOR RIGHT THUMB, IP RIGHT THUMB;  Surgeon: Daryll Brod, MD;  Location: [facility];  Service: Orthopedics;  Laterality: Right;  . PARTIAL HYSTERECTOMY      There were no vitals filed for this visit.      Subjective Assessment - 07/11/16 1032    Subjective I had thumb surgery and did well. Today my neck and LT shldr hurt   Pertinent History H/o neck and shoulder pain.  "Bone spurs."   Diagnostic tests X-ray and MRI.   Currently in Pain? Yes   Pain Score 3    Pain Location Neck   Pain Orientation Right   Pain Descriptors / Indicators Aching   Pain Type Chronic pain   Pain Onset More than a month ago                         [facility] Adult PT Treatment/Exercise - 07/11/16 0001      Shoulder Exercises: Pulleys   Flexion Other (comment)  x5 min     Modalities    Modalities Electrical Stimulation;Moist Heat;Traction     Moist Heat Therapy   Number Minutes Moist Heat 15 Minutes   Moist Heat Location Shoulder;Cervical     Electrical Stimulation   Electrical Stimulation Location shoulder and c-spine IFC x 15 mins 80-150hz    Electrical Stimulation Goals Pain     Traction   Type of Traction Cervical   Min (lbs) 5   Max (lbs) 15   Hold Time 99   Rest Time 5   Time 15     Manual Therapy   Manual Therapy Soft tissue mobilization   Soft tissue mobilization STW and TPR to left  UT and  Levator in sitting.                  PT Short Term Goals - 06/02/16 1115      PT SHORT TERM GOAL #1   Title Ind with HEP.   Time 2   Period Weeks   Status Achieved     PT SHORT TERM GOAL #2   Title Patient will  demonstrate an improvement of at least 10 degrees on all cervical planes with pain 0/10   Time 2   Period Weeks   Status On-going           PT Long Term Goals - 05/20/16 1130      PT LONG TERM GOAL #1   Title Perform ADL's with pain not > 3/10.   Time 8   Period Weeks   Status On-going     PT LONG TERM GOAL #2   Title Active left shoulder flexion to 145 degrees so the patient can easily reach overhead   Baseline 9/21- 11.3 at best    Time 8   Status On-going     PT LONG TERM GOAL #3   Title Active ER to 65 degrees+ to allow for easily donning/doffing of apparel   Time 8   Period Weeks   Status On-going     PT LONG TERM GOAL #4   Title Reach behind back with left hand to L3.   Baseline Patient able to walk 2 miles, 5 days a week without pain   Time 8   Period Weeks   Status On-going     PT LONG TERM GOAL #5   Title Perform ADL's with pain not > 3/10.   Time 6   Period Weeks   Status New               Plan - 07/11/16 1239    Clinical Impression Statement Pt arrived to clinic today doing fairly well. She was able to perform therex for LT shldr with minimal complaints. STW and cervical traction (15#s)   were performed and Pt did well.  Normal response to modalities. LTGs are ongoing due to deficits.      Patient will benefit from skilled therapeutic intervention in order to improve the following deficits and impairments:  Pain, Decreased activity tolerance, Decreased range of motion, Decreased strength, Postural dysfunction  Visit Diagnosis: Cervicalgia  Chronic left shoulder pain  Neck pain  Shoulder stiffness, left     Problem List Patient Active Problem List   Diagnosis Date Noted  . Adhesive capsulitis of left shoulder 03/20/2016  . Fatigue 12/16/2015  . Acute bronchitis 10/31/2015  . Urinary frequency 03/09/2015  . Dizziness and giddiness 08/09/2014  . Morbid obesity ([place]) 07/25/2014  . Orthostatic hypotension 07/07/2014  . Upper airway cough syndrome 06/26/2014  . Diarrhea 05/02/2014  . Dizziness 04/12/2014  . Neuropathy   . Rash and nonspecific skin eruption 08/30/2013  . Paresthesia of both feet 08/30/2013  . Blurred vision, bilateral 02/15/2013  . Eye pain 02/15/2013  . Sinusitis, chronic 10/07/2012  . Left shoulder pain 04/15/2012  . Encounter for preventative adult health care exam with abnormal findings 10/22/2011  . Cervical disc disease 10/22/2011  . Bilateral hand pain 08/05/2011  . Reflux 06/29/2011  . Bloating 06/29/2011  . Back pain 03/13/2011  . Vertigo 01/04/2011  . Nystagmus 01/04/2011  . Insomnia 10/21/2010  . CONSTIPATION 08/17/2009  . VITAMIN D DEFICIENCY 05/15/2009  . DYSPNEA 03/13/2009  . Essential hypertension, benign 01/30/2009  . DEPRESSION 01/16/2009  . Irritable bowel syndrome 01/16/2009  . OSTEOARTHRITIS 12/15/2008  . Diabetes ([place]) 11/03/2008  . Hyperlipidemia 11/03/2008  . Anxiety state 11/03/2008  . OVERACTIVE BLADDER 11/03/2008  . OSA (obstructive sleep apnea) 11/03/2008  . MURMUR 11/03/2008    Lashanna Angelo,CHRIS, {PTA 07/11/2016, 12:45 PM  [facility] [address] [place], [place], [address] Phone: [phone]  Fax:  [phone]  Name: Laura [name] MRN: [id number] Date of Birth: December 26, 1945

## 2016-07-14 ENCOUNTER — Encounter: Payer: Self-pay | Admitting: Physical Therapy

## 2016-07-14 ENCOUNTER — Ambulatory Visit: Payer: Medicare Other | Attending: Specialist | Admitting: Physical Therapy

## 2016-07-14 DIAGNOSIS — M25512 Pain in left shoulder: Secondary | ICD-10-CM | POA: Diagnosis not present

## 2016-07-14 DIAGNOSIS — M542 Cervicalgia: Secondary | ICD-10-CM | POA: Insufficient documentation

## 2016-07-14 DIAGNOSIS — G8929 Other chronic pain: Secondary | ICD-10-CM | POA: Insufficient documentation

## 2016-07-14 DIAGNOSIS — M25612 Stiffness of left shoulder, not elsewhere classified: Secondary | ICD-10-CM | POA: Insufficient documentation

## 2016-07-14 NOTE — Therapy (Signed)
[facility] [place], [place], [address] Phone: [phone]   Fax:  [phone]  Physical Therapy Treatment  Patient Details  Name: Laura [name] MRN: [id number] Date of Birth: [birth year] Referring Provider: Basil Dess, MD  Encounter Date: 07/14/2016      PT End of Session - 07/14/16 1141    Visit Number 15   Number of Visits 16   Date for PT Re-Evaluation 07/13/16   Authorization Type Gcode 10th visit   PT Start Time 1121   PT Stop Time 1207   PT Time Calculation (min) 46 min   Activity Tolerance Patient tolerated treatment well   Behavior During Therapy [facility] for tasks assessed/performed      Past Medical History:  Diagnosis Date  . Anxiety   . Arthritis    back of neck, bones spurs on neck  . Cervical disc disease   . Diabetes mellitus   . Hyperlipidemia   . Hypertension   . Mucoid cyst of joint    right thumb  . Neuropathy   . Reflux   . Sleep apnea    wears CPAP nightly  . Vertigo     Past Surgical History:  Procedure Laterality Date  . BLADDER SURGERY    . BREAST SURGERY    . CHOLECYSTECTOMY    . fibroids removed     breast (both breasts)  . MASS EXCISION Right 06/26/2016   Procedure: EXCISION MUCOID TUMOR RIGHT THUMB, IP RIGHT THUMB;  Surgeon: Daryll Brod, MD;  Location: [facility];  Service: Orthopedics;  Laterality: Right;  . PARTIAL HYSTERECTOMY      There were no vitals filed for this visit.      Subjective Assessment - 07/14/16 1123    Subjective Patient reported no pain in right shoulder only left shoulder and left cervial area   Pertinent History H/o neck and shoulder pain.  "Bone spurs."   Diagnostic tests X-ray and MRI.   Currently in Pain? Yes   Pain Score 2    Pain Location Neck   Pain Orientation Left   Pain Descriptors / Indicators Aching   Pain Type Chronic pain   Pain Onset More than a month ago   Pain Frequency Constant   Aggravating Factors  reaching  behind back or prolong overhead activity   Pain Relieving Factors rest and heat   Pain Score 2   Pain Location Neck   Pain Orientation Left   Pain Descriptors / Indicators Discomfort   Pain Type Chronic pain   Pain Onset More than a month ago   Pain Frequency Intermittent   Aggravating Factors  certain movements   Pain Relieving Factors rest and heat                         [facility] Adult PT Treatment/Exercise - 07/14/16 0001      Shoulder Exercises: Pulleys   Flexion Other (comment)  49min     Moist Heat Therapy   Number Minutes Moist Heat 10 Minutes   Moist Heat Location Shoulder;Cervical     Electrical Stimulation   Electrical Stimulation Location shoulder and c-spine premod x 15 mins 80-150hz    Electrical Stimulation Goals Pain     Traction   Type of Traction Cervical   Min (lbs) 5   Max (lbs) 15   Hold Time 99   Rest Time 5   Time 15     Manual Therapy   Manual  Therapy Passive ROM   Passive ROM gentle PROM to patients left shoulder elevation, IR, ER and capule distraction and cap stretching                  PT Short Term Goals - 06/02/16 1115      PT SHORT TERM GOAL #1   Title Ind with HEP.   Time 2   Period Weeks   Status Achieved     PT SHORT TERM GOAL #2   Title Patient will demonstrate an improvement of at least 10 degrees on all cervical planes with pain 0/10   Time 2   Period Weeks   Status On-going           PT Long Term Goals - 07/14/16 1155      PT LONG TERM GOAL #1   Title Perform ADL's with pain not > 3/10.   Time 8   Period Weeks   Status On-going     PT LONG TERM GOAL #2   Title Active left shoulder flexion to 145 degrees so the patient can easily reach overhead   Baseline 9/21- 11.3 at best    Time 8   Period Weeks   Status On-going     PT LONG TERM GOAL #3   Title Active ER to 65 degrees+ to allow for easily donning/doffing of apparel   Time 8   Period Weeks   Status On-going     PT LONG  TERM GOAL #4   Title Reach behind back with left hand to L3.   Time 8   Period Weeks   Status On-going     PT LONG TERM GOAL #5   Title Perform ADL's with pain not > 3/10.   Time 6   Period Weeks   Status On-going               Plan - 07/14/16 1145    Clinical Impression Statement Patient tolerated treatment well today. Patient has reported improvement oveall. Patient has only complaints of pain in left shoulder and left c-spine. Patient has some difficulty with overhead reaching and reaching behind back. Educated patient on self stretches for wall sliides and self IR stretch (gentle range). Patient goals ongoing due to pain and ROM deficts.    Rehab Potential Good   PT Frequency 2x / week   PT Duration 8 weeks   PT Treatment/Interventions ADLs/Self Care Home Management;Electrical Stimulation;Traction;Moist Heat;Therapeutic activities;Therapeutic exercise;Manual techniques;Patient/family education;Dry needling;Passive range of motion   PT Next Visit Plan cont with POC for Int traction; PROM to left shoulder and capsular stretching, pulleys, UE ranger and HEP to increase ER, STW/M (MD. Louanne Skye 07/31/16)   Consulted and Agree with Plan of Care Patient      Patient will benefit from skilled therapeutic intervention in order to improve the following deficits and impairments:  Pain, Decreased activity tolerance, Decreased range of motion, Decreased strength, Postural dysfunction  Visit Diagnosis: Cervicalgia  Chronic left shoulder pain     Problem List Patient Active Problem List   Diagnosis Date Noted  . Adhesive capsulitis of left shoulder 03/20/2016  . Fatigue 12/16/2015  . Acute bronchitis 10/31/2015  . Urinary frequency 03/09/2015  . Dizziness and giddiness 08/09/2014  . Morbid obesity ([place]) 07/25/2014  . Orthostatic hypotension 07/07/2014  . Upper airway cough syndrome 06/26/2014  . Diarrhea 05/02/2014  . Dizziness 04/12/2014  . Neuropathy   . Rash and  nonspecific skin eruption 08/30/2013  . Paresthesia of both feet 08/30/2013  .  Blurred vision, bilateral 02/15/2013  . Eye pain 02/15/2013  . Sinusitis, chronic 10/07/2012  . Left shoulder pain 04/15/2012  . Encounter for preventative adult health care exam with abnormal findings 10/22/2011  . Cervical disc disease 10/22/2011  . Bilateral hand pain 08/05/2011  . Reflux 06/29/2011  . Bloating 06/29/2011  . Back pain 03/13/2011  . Vertigo 01/04/2011  . Nystagmus 01/04/2011  . Insomnia 10/21/2010  . CONSTIPATION 08/17/2009  . VITAMIN D DEFICIENCY 05/15/2009  . DYSPNEA 03/13/2009  . Essential hypertension, benign 01/30/2009  . DEPRESSION 01/16/2009  . Irritable bowel syndrome 01/16/2009  . OSTEOARTHRITIS 12/15/2008  . Diabetes ([place]) 11/03/2008  . Hyperlipidemia 11/03/2008  . Anxiety state 11/03/2008  . OVERACTIVE BLADDER 11/03/2008  . OSA (obstructive sleep apnea) 11/03/2008  . MURMUR 11/03/2008    Ladean Raya, PTA 07/14/16 12:11 PM [facility] [place], [place], [address] Phone: [phone]   Fax:  [phone]  Name: Laura [name] MRN: [id number] Date of Birth: October 11, 1945

## 2016-07-21 ENCOUNTER — Encounter: Payer: Self-pay | Admitting: Physical Therapy

## 2016-07-24 ENCOUNTER — Ambulatory Visit: Payer: Medicare Other | Admitting: *Deleted

## 2016-07-24 DIAGNOSIS — G8929 Other chronic pain: Secondary | ICD-10-CM

## 2016-07-24 DIAGNOSIS — M542 Cervicalgia: Secondary | ICD-10-CM | POA: Diagnosis not present

## 2016-07-24 DIAGNOSIS — M25512 Pain in left shoulder: Secondary | ICD-10-CM | POA: Diagnosis not present

## 2016-07-24 DIAGNOSIS — M25612 Stiffness of left shoulder, not elsewhere classified: Secondary | ICD-10-CM

## 2016-07-24 NOTE — Therapy (Signed)
[facility] [place], [place], [address] Phone: [phone]   Fax:  [phone]  Physical Therapy Treatment  Patient Details  Name: Laura [name] MRN: [id number] Date of Birth: Jul 18, 1945 Referring Provider: Basil Dess, MD  Encounter Date: 07/24/2016      PT End of Session - 07/24/16 1041    Visit Number 16   Number of Visits 24   Date for PT Re-Evaluation 09/11/16   Authorization Type Gcode 10th visit   PT Start Time 1037   PT Stop Time 1119   PT Time Calculation (min) 42 min      Past Medical History:  Diagnosis Date  . Anxiety   . Arthritis    back of neck, bones spurs on neck  . Cervical disc disease   . Diabetes mellitus   . Hyperlipidemia   . Hypertension   . Mucoid cyst of joint    right thumb  . Neuropathy   . Reflux   . Sleep apnea    wears CPAP nightly  . Vertigo     Past Surgical History:  Procedure Laterality Date  . BLADDER SURGERY    . BREAST SURGERY    . CHOLECYSTECTOMY    . fibroids removed     breast (both breasts)  . MASS EXCISION Right 06/26/2016   Procedure: EXCISION MUCOID TUMOR RIGHT THUMB, IP RIGHT THUMB;  Surgeon: Daryll Brod, MD;  Location: [facility];  Service: Orthopedics;  Laterality: Right;  . PARTIAL HYSTERECTOMY      There were no vitals filed for this visit.      Subjective Assessment - 07/24/16 1040    Subjective I have been moving into a house, and have been busy.  I am hurting on both sides of my neck today 5-6/10   Pertinent History H/o neck and shoulder pain.  "Bone spurs."   Diagnostic tests X-ray and MRI.   Currently in Pain? Yes   Pain Score 5    Pain Location Neck   Pain Orientation Left   Pain Descriptors / Indicators Aching   Pain Type Chronic pain                         [facility] Adult PT Treatment/Exercise - 07/24/16 0001      Modalities   Modalities Electrical Stimulation;Moist Heat;Traction     Moist Heat  Therapy   Number Minutes Moist Heat 15 Minutes   Moist Heat Location Shoulder;Cervical     Electrical Stimulation   Electrical Stimulation Location shoulder and c-spine premod x 15 mins 80-150hz    Electrical Stimulation Goals Pain     Traction   Type of Traction Cervical   Min (lbs) 5   Max (lbs) 15   Hold Time 99   Rest Time 5   Time 15     Manual Therapy   Soft tissue mobilization STW and TPR to left and RT  UT and  Levator in sitting.                  PT Short Term Goals - 06/02/16 1115      PT SHORT TERM GOAL #1   Title Ind with HEP.   Time 2   Period Weeks   Status Achieved     PT SHORT TERM GOAL #2   Title Patient will demonstrate an improvement of at least 10 degrees on all cervical planes with pain 0/10   Time 2  Period Weeks   Status On-going           PT Long Term Goals - 07/14/16 1155      PT LONG TERM GOAL #1   Title Perform ADL's with pain not > 3/10.   Time 8   Period Weeks   Status On-going     PT LONG TERM GOAL #2   Title Active left shoulder flexion to 145 degrees so the patient can easily reach overhead   Baseline 9/21- 11.3 at best    Time 8   Period Weeks   Status On-going     PT LONG TERM GOAL #3   Title Active ER to 65 degrees+ to allow for easily donning/doffing of apparel   Time 8   Period Weeks   Status On-going     PT LONG TERM GOAL #4   Title Reach behind back with left hand to L3.   Time 8   Period Weeks   Status On-going     PT LONG TERM GOAL #5   Title Perform ADL's with pain not > 3/10.   Time 6   Period Weeks   Status On-going               Plan - 07/24/16 1116    Clinical Impression Statement Pt arrived to clinic today with increased soreness due to moving things. She was sore and had increased tightness in Bil UTs. She did well with Rx with TPRs with STW and again with Traction at 15#s. Her cervical ROM with rotation improved to 60 degrees   Rehab Potential Good   PT Frequency 2x /  week   PT Duration 8 weeks   PT Treatment/Interventions ADLs/Self Care Home Management;Electrical Stimulation;Traction;Moist Heat;Therapeutic activities;Therapeutic exercise;Manual techniques;Patient/family education;Dry needling;Passive range of motion   PT Next Visit Plan cont with POC for Int traction; PROM to left shoulder and capsular stretching, pulleys, UE ranger and HEP to increase ER, STW/M (MD. Louanne Skye 07/31/16)   Consulted and Agree with Plan of Care Patient      Patient will benefit from skilled therapeutic intervention in order to improve the following deficits and impairments:  Pain, Decreased activity tolerance, Decreased range of motion, Decreased strength, Postural dysfunction  Visit Diagnosis: Cervicalgia  Chronic left shoulder pain  Neck pain  Shoulder stiffness, left     Problem List Patient Active Problem List   Diagnosis Date Noted  . Adhesive capsulitis of left shoulder 03/20/2016  . Fatigue 12/16/2015  . Acute bronchitis 10/31/2015  . Urinary frequency 03/09/2015  . Dizziness and giddiness 08/09/2014  . Morbid obesity ([place]) 07/25/2014  . Orthostatic hypotension 07/07/2014  . Upper airway cough syndrome 06/26/2014  . Diarrhea 05/02/2014  . Dizziness 04/12/2014  . Neuropathy   . Rash and nonspecific skin eruption 08/30/2013  . Paresthesia of both feet 08/30/2013  . Blurred vision, bilateral 02/15/2013  . Eye pain 02/15/2013  . Sinusitis, chronic 10/07/2012  . Left shoulder pain 04/15/2012  . Encounter for preventative adult health care exam with abnormal findings 10/22/2011  . Cervical disc disease 10/22/2011  . Bilateral hand pain 08/05/2011  . Reflux 06/29/2011  . Bloating 06/29/2011  . Back pain 03/13/2011  . Vertigo 01/04/2011  . Nystagmus 01/04/2011  . Insomnia 10/21/2010  . CONSTIPATION 08/17/2009  . VITAMIN D DEFICIENCY 05/15/2009  . DYSPNEA 03/13/2009  . Essential hypertension, benign 01/30/2009  . DEPRESSION 01/16/2009  . Irritable  bowel syndrome 01/16/2009  . OSTEOARTHRITIS 12/15/2008  . Diabetes ([place]) 11/03/2008  .  Hyperlipidemia 11/03/2008  . Anxiety state 11/03/2008  . OVERACTIVE BLADDER 11/03/2008  . OSA (obstructive sleep apnea) 11/03/2008  . MURMUR 11/03/2008    Laura [name],CHRIS, PTA 07/24/2016, 1:28 PM  [facility] [place], [place], [address] Phone: [phone]   Fax:  [phone]  Name: Laura WEATHERHOLTZ MRN: [id number] Date of Birth: Jun 05, 1945

## 2016-07-26 ENCOUNTER — Other Ambulatory Visit (INDEPENDENT_AMBULATORY_CARE_PROVIDER_SITE_OTHER): Payer: Self-pay | Admitting: Specialist

## 2016-07-26 ENCOUNTER — Other Ambulatory Visit: Payer: Self-pay | Admitting: Internal Medicine

## 2016-07-26 DIAGNOSIS — M7502 Adhesive capsulitis of left shoulder: Secondary | ICD-10-CM

## 2016-07-26 DIAGNOSIS — M542 Cervicalgia: Secondary | ICD-10-CM

## 2016-07-26 DIAGNOSIS — M4712 Other spondylosis with myelopathy, cervical region: Secondary | ICD-10-CM

## 2016-07-28 ENCOUNTER — Ambulatory Visit: Payer: Medicare Other | Admitting: Physical Therapy

## 2016-07-28 ENCOUNTER — Encounter: Payer: Self-pay | Admitting: Physical Therapy

## 2016-07-28 DIAGNOSIS — M25612 Stiffness of left shoulder, not elsewhere classified: Secondary | ICD-10-CM | POA: Diagnosis not present

## 2016-07-28 DIAGNOSIS — M542 Cervicalgia: Secondary | ICD-10-CM | POA: Diagnosis not present

## 2016-07-28 DIAGNOSIS — G8929 Other chronic pain: Secondary | ICD-10-CM

## 2016-07-28 DIAGNOSIS — M25512 Pain in left shoulder: Secondary | ICD-10-CM

## 2016-07-28 NOTE — Therapy (Signed)
[facility] [place], [place], [address] Phone: [phone]   Fax:  [phone]  Physical Therapy Treatment  Patient Details  Name: Laura [name] MRN: [id number] Date of Birth: May 18, 1945 Referring Provider: Basil Dess, MD  Encounter Date: 07/28/2016      PT End of Session - 07/28/16 1625    Visit Number 17   Number of Visits 24   Date for PT Re-Evaluation 09/11/16   Authorization Type Gcode 10th visit   PT Start Time 1629   PT Stop Time 1732   PT Time Calculation (min) 63 min   Activity Tolerance Patient tolerated treatment well   Behavior During Therapy [facility] for tasks assessed/performed      Past Medical History:  Diagnosis Date  . Anxiety   . Arthritis    back of neck, bones spurs on neck  . Cervical disc disease   . Diabetes mellitus   . Hyperlipidemia   . Hypertension   . Mucoid cyst of joint    right thumb  . Neuropathy   . Reflux   . Sleep apnea    wears CPAP nightly  . Vertigo     Past Surgical History:  Procedure Laterality Date  . BLADDER SURGERY    . BREAST SURGERY    . CHOLECYSTECTOMY    . fibroids removed     breast (both breasts)  . MASS EXCISION Right 06/26/2016   Procedure: EXCISION MUCOID TUMOR RIGHT THUMB, IP RIGHT THUMB;  Surgeon: Daryll Brod, MD;  Location: [facility];  Service: Orthopedics;  Laterality: Right;  . PARTIAL HYSTERECTOMY      There were no vitals filed for this visit.      Subjective Assessment - 07/28/16 1625    Subjective Reports that she has been moving and having to pick up items but was careful. Reports that she still has been having issues with IR and ER.   Pertinent History H/o neck and shoulder pain.  "Bone spurs."   Diagnostic tests X-ray and MRI.   Currently in Pain? Yes   Pain Score 3    Pain Location Neck   Pain Orientation Left   Pain Descriptors / Indicators Discomfort   Pain Type Chronic pain   Pain Onset More than a month ago    Pain Score 3   Pain Location Shoulder   Pain Orientation Left   Pain Descriptors / Indicators Discomfort   Pain Type Chronic pain   Pain Onset More than a month ago            [facility] PT Assessment - 07/28/16 0001      Assessment   Medical Diagnosis neck and left shoulder   Next MD Visit 07/31/2016     Precautions   Precautions None     Restrictions   Weight Bearing Restrictions No                     [facility] Adult PT Treatment/Exercise - 07/28/16 0001      Shoulder Exercises: Pulleys   Flexion Other (comment)  x5 min   Other Pulley Exercises Standing UE ranger into flexion/circles x30 reps     Shoulder Exercises: Stretch   Internal Rotation Stretch Other (comment)   Internal Rotation Stretch Limitations x10 reps short towel stretches   External Rotation Stretch 3 reps;20 seconds     Modalities   Modalities Electrical Stimulation;Moist Heat;Traction     Moist Heat Therapy   Number Minutes Moist  Heat 15 Minutes   Moist Heat Location Cervical;Shoulder     Electrical Stimulation   Electrical Stimulation Location L shoulder, B UT   Electrical Stimulation Action Pre-Mod   Electrical Stimulation Parameters 80-150 hz x15 min   Electrical Stimulation Goals Pain;Tone     Traction   Type of Traction Cervical   Min (lbs) 5   Max (lbs) 15   Hold Time 99   Rest Time 5   Time 15     Manual Therapy   Manual Therapy Soft tissue mobilization   Soft tissue mobilization STW/TPR to L UT, posterior shoulder, deltoids to reduce pain and tone                  PT Short Term Goals - 06/02/16 1115      PT SHORT TERM GOAL #1   Title Ind with HEP.   Time 2   Period Weeks   Status Achieved     PT SHORT TERM GOAL #2   Title Patient will demonstrate an improvement of at least 10 degrees on all cervical planes with pain 0/10   Time 2   Period Weeks   Status On-going           PT Long Term Goals - 07/14/16 1155      PT LONG TERM GOAL #1    Title Perform ADL's with pain not > 3/10.   Time 8   Period Weeks   Status On-going     PT LONG TERM GOAL #2   Title Active left shoulder flexion to 145 degrees so the patient can easily reach overhead   Baseline 9/21- 11.3 at best    Time 8   Period Weeks   Status On-going     PT LONG TERM GOAL #3   Title Active ER to 65 degrees+ to allow for easily donning/doffing of apparel   Time 8   Period Weeks   Status On-going     PT LONG TERM GOAL #4   Title Reach behind back with left hand to L3.   Time 8   Period Weeks   Status On-going     PT LONG TERM GOAL #5   Title Perform ADL's with pain not > 3/10.   Time 6   Period Weeks   Status On-going               Plan - 07/28/16 1722    Clinical Impression Statement Patient tolerated today's treatment fairly well although she was able to tolerate shoulder stretches to decrease limitation of ROM. Tone present along L UT region and one TP noted in L posterior deltoid region with minimal to moderate decrease with manual therapy. Normal modalities response noted following removal of the modalities. Traction maintained at 15# max today with normal response by patient.   Rehab Potential Good   PT Frequency 2x / week   PT Duration 8 weeks   PT Treatment/Interventions ADLs/Self Care Home Management;Electrical Stimulation;Traction;Moist Heat;Therapeutic activities;Therapeutic exercise;Manual techniques;Patient/family education;Dry needling;Passive range of motion   PT Next Visit Plan Continue with shoulder ROM, manual therapy as well as modalities with traction per MPT POC.   Consulted and Agree with Plan of Care Patient      Patient will benefit from skilled therapeutic intervention in order to improve the following deficits and impairments:  Pain, Decreased activity tolerance, Decreased range of motion, Decreased strength, Postural dysfunction  Visit Diagnosis: Cervicalgia  Chronic left shoulder pain     Problem  List Patient Active Problem List   Diagnosis Date Noted  . Adhesive capsulitis of left shoulder 03/20/2016  . Fatigue 12/16/2015  . Acute bronchitis 10/31/2015  . Urinary frequency 03/09/2015  . Dizziness and giddiness 08/09/2014  . Morbid obesity ([place]) 07/25/2014  . Orthostatic hypotension 07/07/2014  . Upper airway cough syndrome 06/26/2014  . Diarrhea 05/02/2014  . Dizziness 04/12/2014  . Neuropathy   . Rash and nonspecific skin eruption 08/30/2013  . Paresthesia of both feet 08/30/2013  . Blurred vision, bilateral 02/15/2013  . Eye pain 02/15/2013  . Sinusitis, chronic 10/07/2012  . Left shoulder pain 04/15/2012  . Encounter for preventative adult health care exam with abnormal findings 10/22/2011  . Cervical disc disease 10/22/2011  . Bilateral hand pain 08/05/2011  . Reflux 06/29/2011  . Bloating 06/29/2011  . Back pain 03/13/2011  . Vertigo 01/04/2011  . Nystagmus 01/04/2011  . Insomnia 10/21/2010  . CONSTIPATION 08/17/2009  . VITAMIN D DEFICIENCY 05/15/2009  . DYSPNEA 03/13/2009  . Essential hypertension, benign 01/30/2009  . DEPRESSION 01/16/2009  . Irritable bowel syndrome 01/16/2009  . OSTEOARTHRITIS 12/15/2008  . Diabetes ([place]) 11/03/2008  . Hyperlipidemia 11/03/2008  . Anxiety state 11/03/2008  . OVERACTIVE BLADDER 11/03/2008  . OSA (obstructive sleep apnea) 11/03/2008  . MURMUR 11/03/2008    Wynelle Fanny, PTA 07/28/2016, 5:35 PM  [facility] [address] [place], [place], [address] Phone: [phone]   Fax:  [phone]  Name: Laura [name] MRN: [id number] Date of Birth: [birth year]

## 2016-07-28 NOTE — Telephone Encounter (Signed)
ultracet refill request 

## 2016-07-29 NOTE — Telephone Encounter (Signed)
I called to [facility]

## 2016-07-31 ENCOUNTER — Ambulatory Visit (INDEPENDENT_AMBULATORY_CARE_PROVIDER_SITE_OTHER): Payer: Medicare Other

## 2016-07-31 ENCOUNTER — Telehealth (INDEPENDENT_AMBULATORY_CARE_PROVIDER_SITE_OTHER): Payer: Self-pay | Admitting: Specialist

## 2016-07-31 ENCOUNTER — Ambulatory Visit (INDEPENDENT_AMBULATORY_CARE_PROVIDER_SITE_OTHER): Payer: Medicare Other | Admitting: Specialist

## 2016-07-31 ENCOUNTER — Encounter (INDEPENDENT_AMBULATORY_CARE_PROVIDER_SITE_OTHER): Payer: Self-pay | Admitting: Specialist

## 2016-07-31 ENCOUNTER — Ambulatory Visit: Payer: Medicare Other | Admitting: Physical Therapy

## 2016-07-31 VITALS — BP 158/78 | HR 71 | Ht 64.0 in | Wt 198.0 lb

## 2016-07-31 DIAGNOSIS — M542 Cervicalgia: Secondary | ICD-10-CM | POA: Diagnosis not present

## 2016-07-31 DIAGNOSIS — M25561 Pain in right knee: Secondary | ICD-10-CM

## 2016-07-31 DIAGNOSIS — M1711 Unilateral primary osteoarthritis, right knee: Secondary | ICD-10-CM

## 2016-07-31 DIAGNOSIS — M19012 Primary osteoarthritis, left shoulder: Secondary | ICD-10-CM

## 2016-07-31 DIAGNOSIS — M47812 Spondylosis without myelopathy or radiculopathy, cervical region: Secondary | ICD-10-CM | POA: Diagnosis not present

## 2016-07-31 DIAGNOSIS — G8929 Other chronic pain: Secondary | ICD-10-CM

## 2016-07-31 DIAGNOSIS — M25512 Pain in left shoulder: Secondary | ICD-10-CM

## 2016-07-31 DIAGNOSIS — M25612 Stiffness of left shoulder, not elsewhere classified: Secondary | ICD-10-CM | POA: Diagnosis not present

## 2016-07-31 MED ORDER — METHYLPREDNISOLONE ACETATE 40 MG/ML IJ SUSP
40.0000 mg | INTRAMUSCULAR | Status: AC | PRN
  Administered 2016-07-31: 40 mg via INTRA_ARTICULAR

## 2016-07-31 MED ORDER — BUPIVACAINE HCL 0.25 % IJ SOLN
4.0000 mL | INTRAMUSCULAR | Status: AC | PRN
  Administered 2016-07-31: 4 mL via INTRA_ARTICULAR

## 2016-07-31 NOTE — Progress Notes (Addendum)
Office Visit Note   Patient: Laura [name]           Date of Birth: Aug 03, 1945           MRN: [id number] Visit Date: 07/31/2016              Requested by: Biagio Borg, MD [place], [place] [address] PCP: Biagio Borg, MD   Assessment & Plan: Visit Diagnoses:  1. Acute pain of right knee   2. Primary osteoarthritis of left shoulder   3. Spondylosis without myelopathy or radiculopathy, cervical region   4. Unilateral primary osteoarthritis, right knee     Plan: Knee is suffering from osteoarthritis, only real proven treatments are Weight loss, NSIADs like diclofenac and exercise. Well padded shoes help. Ice the knee 2-3 times a day 15-20 mins at a time. Avoid overhead lifting and overhead use of the arms. Do not lift greater than 5 lbs. Adjust head rest in vehicle to prevent hyperextension if rear ended. Use ES tylenol 4 times per day.Return in one week for right knee injection with cortisone   Follow-Up Instructions: Return in about 7 days (around 08/07/2016) for over book to have right knee intraarticular injection.   Orders:  Orders Placed This Encounter  Procedures  . Large Joint Injection/Arthrocentesis  . XR Knee 1-2 Views Right  . Ambulatory referral to Physical Medicine Rehab   No orders of the defined types were placed in this encounter.     Procedures: Large Joint Inj Date/Time: 07/31/2016 5:01 PM Performed by: Jessy Oto Authorized by: Jessy Oto   Consent Given by:  Patient Site marked: the procedure site was marked   Indications:  Pain Location:  Shoulder Site:  L subacromial bursa Prep: patient was prepped and draped in usual sterile fashion   Needle Size:  25 G Needle Length:  1.5 inches Approach:  Anterolateral Ultrasound Guidance: No   Fluoroscopic Guidance: No   Arthrogram: No   Medications:  4 mL bupivacaine 0.25 %; 40 mg methylPREDNISolone acetate 40 MG/ML Patient tolerance:  Patient tolerated the procedure  well with no immediate complications  bandaid applied.      Clinical Data: No additional findings.   Subjective: Chief Complaint  Patient presents with  . Neck - Follow-up  . Right Knee - Pain, Edema    71 year old female with past history of lumbar degenerative disc disease and SI arthrosis. She has noticed increasing right knee pain over the last one week with increased pain with squatting and kneeling. Weight is up and down 5-10 lbs one way or the other. Pain with stair climbing. Brother with a history of gout but she has never has had an episode.    Review of Systems  Constitutional: Positive for activity change and unexpected weight change. Negative for appetite change, chills, diaphoresis, fatigue and fever.  HENT: Negative for congestion, dental problem, drooling, ear discharge, ear pain, facial swelling, hearing loss, nosebleeds, rhinorrhea, sinus pain, sinus pressure, sore throat and trouble swallowing.   Eyes: Negative for pain, redness and itching.  Respiratory: Negative for cough, choking, shortness of breath and wheezing.   Cardiovascular: Positive for leg swelling. Negative for chest pain.  Gastrointestinal: Negative for diarrhea, nausea and vomiting.  Endocrine: Positive for heat intolerance.  Genitourinary: Positive for difficulty urinating. Negative for enuresis, flank pain, frequency and hematuria.  Musculoskeletal: Positive for arthralgias, back pain and joint swelling. Negative for neck pain.  Skin: Negative for  color change, pallor, rash and wound.  Neurological: Negative for seizures and weakness.  Hematological: Negative for adenopathy. Does not bruise/bleed easily.  Psychiatric/Behavioral: Negative for agitation, behavioral problems, confusion, decreased concentration, dysphoric mood, hallucinations, self-injury, sleep disturbance and suicidal ideas. The patient is not nervous/anxious and is not hyperactive.      Objective: Vital Signs: BP (!) 158/78  (BP Location: Left Arm, Patient Position: Sitting)   Pulse 71   Ht 5\' 4"  (1.626 m)   Wt 198 lb (89.8 kg)   BMI 33.99 kg/m   Physical Exam  Constitutional: She is oriented to person, place, and time. She appears well-developed and well-nourished.  HENT:  Head: Normocephalic and atraumatic.  Eyes: Pupils are equal, round, and reactive to light. EOM are normal. Right eye exhibits no discharge. Left eye exhibits no discharge.  Neck: Normal range of motion. Neck supple. No JVD present. No tracheal deviation present. No thyromegaly present.  Cardiovascular: Intact distal pulses.   Pulmonary/Chest: Effort normal. No stridor. No respiratory distress. She has no wheezes. She has no rales. She exhibits no tenderness.  Abdominal: Soft. Bowel sounds are normal. She exhibits no distension. There is no tenderness. There is no guarding.  Musculoskeletal: Normal range of motion. She exhibits edema and tenderness.  Lymphadenopathy:    She has no cervical adenopathy.  Neurological: She is alert and oriented to person, place, and time.  Skin: Skin is warm and dry.  Psychiatric: She has a normal mood and affect. Her behavior is normal. Judgment and thought content normal.    Ortho Exam  Specialty Comments:  No specialty comments available.  Imaging: Xr Knee 1-2 Views Right  Result Date: 07/31/2016 AP bilateral standing knees and lateral of the right knee. These radiographs show minimal joint line narrowing. Osteophytes are forming over the lateral joint line that are minimal with crowding of the intercondylar notch. Superior pole osteophyte off the patella that is mild with minimal loss of the posterior contour of the patella  Due to posterior erosion of the joint surface. Findings consistent with mild right knee osteoarthritis that is tricompartment.     PMFS History: Patient Active Problem List   Diagnosis Date Noted  . Adhesive capsulitis of left shoulder 03/20/2016  . Fatigue 12/16/2015  .  Acute bronchitis 10/31/2015  . Urinary frequency 03/09/2015  . Dizziness and giddiness 08/09/2014  . Morbid obesity ([place]) 07/25/2014  . Orthostatic hypotension 07/07/2014  . Upper airway cough syndrome 06/26/2014  . Diarrhea 05/02/2014  . Dizziness 04/12/2014  . Neuropathy   . Rash and nonspecific skin eruption 08/30/2013  . Paresthesia of both feet 08/30/2013  . Blurred vision, bilateral 02/15/2013  . Eye pain 02/15/2013  . Sinusitis, chronic 10/07/2012  . Left shoulder pain 04/15/2012  . Encounter for preventative adult health care exam with abnormal findings 10/22/2011  . Cervical disc disease 10/22/2011  . Bilateral hand pain 08/05/2011  . Reflux 06/29/2011  . Bloating 06/29/2011  . Back pain 03/13/2011  . Vertigo 01/04/2011  . Nystagmus 01/04/2011  . Insomnia 10/21/2010  . CONSTIPATION 08/17/2009  . VITAMIN D DEFICIENCY 05/15/2009  . DYSPNEA 03/13/2009  . Essential hypertension, benign 01/30/2009  . DEPRESSION 01/16/2009  . Irritable bowel syndrome 01/16/2009  . OSTEOARTHRITIS 12/15/2008  . Diabetes ([place]) 11/03/2008  . Hyperlipidemia 11/03/2008  . Anxiety state 11/03/2008  . OVERACTIVE BLADDER 11/03/2008  . OSA (obstructive sleep apnea) 11/03/2008  . MURMUR 11/03/2008   Past Medical History:  Diagnosis Date  . Anxiety   . Arthritis  back of neck, bones spurs on neck  . Cervical disc disease   . Diabetes mellitus   . Hyperlipidemia   . Hypertension   . Mucoid cyst of joint    right thumb  . Neuropathy   . Reflux   . Sleep apnea    wears CPAP nightly  . Vertigo     Family History  Problem Relation Age of Onset  . Hypertension Other   . Diabetes Father   . Diabetes Sister   . Diabetes Maternal Aunt   . Colon cancer Neg Hx   . Esophageal cancer Neg Hx   . Stomach cancer Neg Hx   . Rectal cancer Neg Hx     Past Surgical History:  Procedure Laterality Date  . BLADDER SURGERY    . BREAST SURGERY    . CHOLECYSTECTOMY    . fibroids removed      breast (both breasts)  . MASS EXCISION Right 06/26/2016   Procedure: EXCISION MUCOID TUMOR RIGHT THUMB, IP RIGHT THUMB;  Surgeon: Daryll Brod, MD;  Location: [place] [facility];  Service: Orthopedics;  Laterality: Right;  . PARTIAL HYSTERECTOMY     Social History   Occupational History  . 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 Smoking status: Never Smoker  . Smokeless tobacco: Never Used  . Alcohol use No  . Drug use: No  . Sexual activity: Not Currently

## 2016-07-31 NOTE — Therapy (Addendum)
[facility] [place], [place], [address] Phone: [phone]   Fax:  [phone]  Physical Therapy Treatment  Patient Details  Name: Laura [name] MRN: [id number] Date of Birth: 02-04-1945 Referring Provider: Basil Dess, MD  Encounter Date: 07/31/2016      PT End of Session - 07/31/16 1159    Visit Number 18   Number of Visits 24   Date for PT Re-Evaluation 09/11/16   Authorization Type Gcode 10th visit   PT Start Time 1120   PT Stop Time 1211   PT Time Calculation (min) 51 min   Activity Tolerance Patient tolerated treatment well   Behavior During Therapy [facility] for tasks assessed/performed      Past Medical History:  Diagnosis Date  . Anxiety   . Arthritis    back of neck, bones spurs on neck  . Cervical disc disease   . Diabetes mellitus   . Hyperlipidemia   . Hypertension   . Mucoid cyst of joint    right thumb  . Neuropathy   . Reflux   . Sleep apnea    wears CPAP nightly  . Vertigo     Past Surgical History:  Procedure Laterality Date  . BLADDER SURGERY    . BREAST SURGERY    . CHOLECYSTECTOMY    . fibroids removed     breast (both breasts)  . MASS EXCISION Right 06/26/2016   Procedure: EXCISION MUCOID TUMOR RIGHT THUMB, IP RIGHT THUMB;  Surgeon: Daryll Brod, MD;  Location: [place];  Service: Orthopedics;  Laterality: Right;  . PARTIAL HYSTERECTOMY      There were no vitals filed for this visit.      Subjective Assessment - 07/31/16 1200    Subjective I'm 75 to 80% better.   Pain Score 3    Pain Location Neck   Pain Orientation Left   Pain Descriptors / Indicators Discomfort   Pain Type Chronic pain   Pain Onset More than a month ago                         [facility] Adult PT Treatment/Exercise - 07/31/16 0001      Shoulder Exercises: Pulleys   Flexion Limitations 5 minutes.     Shoulder Exercises: ROM/Strengthening   UBE (Upper Arm Bike) 4 minutes at  120 RPM's.     Modalities   Modalities Electrical Stimulation     Moist Heat Therapy   Number Minutes Moist Heat 15 Minutes   Moist Heat Location --  Left shoulder.     Electrical Stimulation   Electrical Stimulation Location Left shoulder; left cervical region.   Electrical Stimulation Action IFC   Electrical Stimulation Parameters 80-150 Hz at 100% scan x 15 minutes.   Electrical Stimulation Goals Tone;Pain     Traction   Type of Traction Cervical   Min (lbs) 5   Max (lbs) 15   Hold Time 99   Rest Time 5   Time 15                  PT Short Term Goals - 06/02/16 1115      PT SHORT TERM GOAL #1   Title Ind with HEP.   Time 2   Period Weeks   Status Achieved     PT SHORT TERM GOAL #2   Title Patient will demonstrate an improvement of at least 10 degrees on all cervical planes with  pain 0/10   Time 2   Period Weeks   Status On-going           PT Long Term Goals - 07/14/16 1155      PT LONG TERM GOAL #1   Title Perform ADL's with pain not > 3/10.   Time 8   Period Weeks   Status On-going     PT LONG TERM GOAL #2   Title Active left shoulder flexion to 145 degrees so the patient can easily reach overhead   Baseline 9/21- 11.3 at best    Time 8   Period Weeks   Status On-going     PT LONG TERM GOAL #3   Title Active ER to 65 degrees+ to allow for easily donning/doffing of apparel   Time 8   Period Weeks   Status On-going     PT LONG TERM GOAL #4   Title Reach behind back with left hand to L3.   Time 8   Period Weeks   Status On-going     PT LONG TERM GOAL #5   Title Perform ADL's with pain not > 3/10.   Time 6   Period Weeks   Status On-going               Plan - 07/31/16 1203    Clinical Impression Statement Patient doing well with an overall subjective improvement rating of 75-80%.      Patient will benefit from skilled therapeutic intervention in order to improve the following deficits and impairments:     Visit  Diagnosis: Cervicalgia  Chronic left shoulder pain     Problem List Patient Active Problem List   Diagnosis Date Noted  . Adhesive capsulitis of left shoulder 03/20/2016  . Fatigue 12/16/2015  . Acute bronchitis 10/31/2015  . Urinary frequency 03/09/2015  . Dizziness and giddiness 08/09/2014  . Morbid obesity ([place]) 07/25/2014  . Orthostatic hypotension 07/07/2014  . Upper airway cough syndrome 06/26/2014  . Diarrhea 05/02/2014  . Dizziness 04/12/2014  . Neuropathy   . Rash and nonspecific skin eruption 08/30/2013  . Paresthesia of both feet 08/30/2013  . Blurred vision, bilateral 02/15/2013  . Eye pain 02/15/2013  . Sinusitis, chronic 10/07/2012  . Left shoulder pain 04/15/2012  . Encounter for preventative adult health care exam with abnormal findings 10/22/2011  . Cervical disc disease 10/22/2011  . Bilateral hand pain 08/05/2011  . Reflux 06/29/2011  . Bloating 06/29/2011  . Back pain 03/13/2011  . Vertigo 01/04/2011  . Nystagmus 01/04/2011  . Insomnia 10/21/2010  . CONSTIPATION 08/17/2009  . VITAMIN D DEFICIENCY 05/15/2009  . DYSPNEA 03/13/2009  . Essential hypertension, benign 01/30/2009  . DEPRESSION 01/16/2009  . Irritable bowel syndrome 01/16/2009  . OSTEOARTHRITIS 12/15/2008  . Diabetes ([place]) 11/03/2008  . Hyperlipidemia 11/03/2008  . Anxiety state 11/03/2008  . OVERACTIVE BLADDER 11/03/2008  . OSA (obstructive sleep apnea) 11/03/2008  . MURMUR 11/03/2008    Kirstina Leinweber, [place] MPT 07/31/2016, 12:15 PM  [facility] [address] [place], [place], [address] Phone: [phone]   Fax:  [phone]  Name: SAMHITHA ROSEN MRN: [id number] Date of Birth: 1945/10/03  PHYSICAL THERAPY DISCHARGE SUMMARY  Visits from Start of Care: 18.  Current functional level related to goals / functional outcomes: See above.     Remaining deficits: Patient responded well to treatments reporting she felt 75 to 80% better.    Education / Equipment: HEP. Plan: Patient agrees to discharge.  Patient goals were not  met. Patient is being discharged due to being pleased with the current functional level.  ?????         Mali Rumi Taras MPT

## 2016-07-31 NOTE — Telephone Encounter (Signed)
CAN YOU PLEASE OPEN UP A STOP FOR NITKA IN ABOUT 7 DAYS PER NITKA  [phone]

## 2016-07-31 NOTE — Patient Instructions (Addendum)
  Knee is suffering from osteoarthritis, only real proven treatments are Weight loss, NSIADs like diclofenac and exercise. Well padded shoes help. Ice the knee 2-3 times a day 15-20 mins at a time. Avoid overhead lifting and overhead use of the arms. Do not lift greater than 5 lbs. Adjust head rest in vehicle to prevent hyperextension if rear ended. Use ES tylenol 4 times per day. Return in one week for right knee injection with cortisone.

## 2016-08-01 NOTE — Telephone Encounter (Signed)
Let me know when and you can call her.

## 2016-08-11 ENCOUNTER — Encounter (INDEPENDENT_AMBULATORY_CARE_PROVIDER_SITE_OTHER): Payer: Self-pay | Admitting: Specialist

## 2016-08-11 ENCOUNTER — Encounter: Payer: Self-pay | Admitting: Internal Medicine

## 2016-08-11 MED ORDER — LOSARTAN POTASSIUM 100 MG PO TABS: 100.0000 mg | ORAL_TABLET | Freq: Every day | ORAL | 3 refills | Status: DC

## 2016-08-12 ENCOUNTER — Other Ambulatory Visit: Payer: Self-pay | Admitting: Internal Medicine

## 2016-08-12 NOTE — Telephone Encounter (Signed)
error 

## 2016-08-14 ENCOUNTER — Ambulatory Visit (INDEPENDENT_AMBULATORY_CARE_PROVIDER_SITE_OTHER): Payer: Medicare Other | Admitting: Specialist

## 2016-08-14 ENCOUNTER — Encounter (INDEPENDENT_AMBULATORY_CARE_PROVIDER_SITE_OTHER): Payer: Self-pay | Admitting: Specialist

## 2016-08-14 VITALS — BP 166/74 | HR 79

## 2016-08-14 DIAGNOSIS — M1711 Unilateral primary osteoarthritis, right knee: Secondary | ICD-10-CM | POA: Diagnosis not present

## 2016-08-14 DIAGNOSIS — M778 Other enthesopathies, not elsewhere classified: Secondary | ICD-10-CM

## 2016-08-14 DIAGNOSIS — M47812 Spondylosis without myelopathy or radiculopathy, cervical region: Secondary | ICD-10-CM

## 2016-08-14 DIAGNOSIS — M7582 Other shoulder lesions, left shoulder: Secondary | ICD-10-CM

## 2016-08-14 NOTE — Patient Instructions (Addendum)
Avoid overhead lifting and overhead use of the arms. Do not lift greater than 10 lbs. Tylenol ES one every 6-8 hours for pain and inflamation. Call if you are having right knee pain and need to consider a cortisone injection into the right knee. Continue with PT for another 2-3 weeks, then a home exercise program.

## 2016-08-14 NOTE — Progress Notes (Signed)
Office Visit Note   Patient: Laura [name]           Date of Birth: [birth year]           MRN: [id number] Visit Date: 08/14/2016              Requested by: Biagio Borg, MD [place], [place] [address] PCP: Biagio Borg, MD   Assessment & Plan: Visit Diagnoses:  1. Left shoulder tendonitis   2. Unilateral primary osteoarthritis, right knee   3. Spondylosis without myelopathy or radiculopathy, cervical region     Plan: Avoid overhead lifting and overhead use of the arms. Do not lift greater than 10 lbs. Tylenol ES one every 6-8 hours for pain and inflamation. Call if you are having right knee pain and need to consider a cortisone injection into the right knee. Continue with PT for another 2-3 weeks, then a home exercise program.  Follow-Up Instructions: No Follow-up on file.   Orders:  No orders of the defined types were placed in this encounter.  No orders of the defined types were placed in this encounter.     Procedures: No procedures performed   Clinical Data: No additional findings.   Subjective: Chief Complaint  Patient presents with  . Right Knee - Pain  . Left Shoulder - Pain    72 year old female, right handed has seen improvement with left shoulder SAS injection, she reports 85-90% improvement in her left shoulder pain. Still painful with external rotation. Over all she takes one tylenol at night to help with sleep.     Review of Systems  Constitutional: Negative.   HENT: Negative.   Eyes: Negative.   Respiratory: Negative.   Cardiovascular: Negative.   Gastrointestinal: Negative.   Endocrine: Negative.   Genitourinary: Negative.   Musculoskeletal: Negative.   Skin: Negative.   Allergic/Immunologic: Negative.   Neurological: Negative.   Hematological: Negative.   Psychiatric/Behavioral: Negative.      Objective: Vital Signs: BP (!) 166/74   Pulse 79   Physical Exam  Constitutional: She is oriented to person,  place, and time. She appears well-developed and well-nourished.  HENT:  Head: Normocephalic and atraumatic.  Eyes: Pupils are equal, round, and reactive to light. EOM are normal.  Neck: Normal range of motion. Neck supple.  Pulmonary/Chest: Effort normal and breath sounds normal.  Abdominal: Soft. Bowel sounds are normal.  Neurological: She is alert and oriented to person, place, and time.  Skin: Skin is warm and dry.  Psychiatric: She has a normal mood and affect. Her behavior is normal. Judgment and thought content normal.    Right Shoulder Exam   Tenderness  The patient is experiencing tenderness in the acromion and clavicle.   Left Shoulder Exam   Tenderness  The patient is experiencing tenderness in the acromion and clavicle.  Range of Motion  Active Abduction: abnormal  Passive Abduction: abnormal  Extension: normal  External Rotation:  70 abnormal  Internal Rotation 0 degrees:  T4 normal  Internal Rotation 90 degrees:  70 normal   Muscle Strength  Abduction: 5/5  Internal Rotation: 5/5  External Rotation: 4/5  Supraspinatus: 5/5  Subscapularis: 5/5   Tests  Apprehension: negative Cross Arm: negative Drop Arm: negative Hawkin's test: negative Impingement: positive Sulcus: absent  Other  Erythema: absent Scars: absent Sensation: normal Pulse: present       Specialty Comments:  No specialty comments available.  Imaging: No results found.  PMFS History: Patient Active Problem List   Diagnosis Date Noted  . Adhesive capsulitis of left shoulder 03/20/2016  . Fatigue 12/16/2015  . Acute bronchitis 10/31/2015  . Urinary frequency 03/09/2015  . Dizziness and giddiness 08/09/2014  . Morbid obesity ([place]) 07/25/2014  . Orthostatic hypotension 07/07/2014  . Upper airway cough syndrome 06/26/2014  . Diarrhea 05/02/2014  . Dizziness 04/12/2014  . Neuropathy   . Rash and nonspecific skin eruption 08/30/2013  . Paresthesia of both feet 08/30/2013    . Blurred vision, bilateral 02/15/2013  . Eye pain 02/15/2013  . Sinusitis, chronic 10/07/2012  . Left shoulder pain 04/15/2012  . Encounter for preventative adult health care exam with abnormal findings 10/22/2011  . Cervical disc disease 10/22/2011  . Bilateral hand pain 08/05/2011  . Reflux 06/29/2011  . Bloating 06/29/2011  . Back pain 03/13/2011  . Vertigo 01/04/2011  . Nystagmus 01/04/2011  . Insomnia 10/21/2010  . CONSTIPATION 08/17/2009  . VITAMIN D DEFICIENCY 05/15/2009  . DYSPNEA 03/13/2009  . Essential hypertension, benign 01/30/2009  . DEPRESSION 01/16/2009  . Irritable bowel syndrome 01/16/2009  . OSTEOARTHRITIS 12/15/2008  . Diabetes ([place]) 11/03/2008  . Hyperlipidemia 11/03/2008  . Anxiety state 11/03/2008  . OVERACTIVE BLADDER 11/03/2008  . OSA (obstructive sleep apnea) 11/03/2008  . MURMUR 11/03/2008   Past Medical History:  Diagnosis Date  . Anxiety   . Arthritis    back of neck, bones spurs on neck  . Cervical disc disease   . Diabetes mellitus   . Hyperlipidemia   . Hypertension   . Mucoid cyst of joint    right thumb  . Neuropathy   . Reflux   . Sleep apnea    wears CPAP nightly  . Vertigo     Family History  Problem Relation Age of Onset  . Hypertension Other   . Diabetes Father   . Diabetes Sister   . Diabetes Maternal Aunt   . Colon cancer Neg Hx   . Esophageal cancer Neg Hx   . Stomach cancer Neg Hx   . Rectal cancer Neg Hx     Past Surgical History:  Procedure Laterality Date  . BLADDER SURGERY    . BREAST SURGERY    . CHOLECYSTECTOMY    . fibroids removed     breast (both breasts)  . MASS EXCISION Right 06/26/2016   Procedure: EXCISION MUCOID TUMOR RIGHT THUMB, IP RIGHT THUMB;  Surgeon: Daryll Brod, MD;  Location: [place] [facility];  Service: Orthopedics;  Laterality: Right;  . PARTIAL HYSTERECTOMY     Social History   Occupational History  . 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  Smoking status: Never Smoker  . Smokeless tobacco: Never Used  . Alcohol use No  . Drug use: No  . Sexual activity: Not Currently

## 2016-08-22 ENCOUNTER — Other Ambulatory Visit: Payer: Self-pay | Admitting: Internal Medicine

## 2016-08-22 DIAGNOSIS — Z1231 Encounter for screening mammogram for malignant neoplasm of breast: Secondary | ICD-10-CM

## 2016-08-29 ENCOUNTER — Encounter: Payer: Self-pay | Admitting: Internal Medicine

## 2016-08-29 ENCOUNTER — Other Ambulatory Visit (INDEPENDENT_AMBULATORY_CARE_PROVIDER_SITE_OTHER): Payer: Medicare Other

## 2016-08-29 ENCOUNTER — Ambulatory Visit (INDEPENDENT_AMBULATORY_CARE_PROVIDER_SITE_OTHER): Payer: Medicare Other | Admitting: Internal Medicine

## 2016-08-29 VITALS — BP 136/84 | HR 88 | Temp 98.0°F | Ht 64.0 in | Wt 199.0 lb

## 2016-08-29 DIAGNOSIS — Z1159 Encounter for screening for other viral diseases: Secondary | ICD-10-CM

## 2016-08-29 DIAGNOSIS — E1140 Type 2 diabetes mellitus with diabetic neuropathy, unspecified: Secondary | ICD-10-CM | POA: Diagnosis not present

## 2016-08-29 DIAGNOSIS — Z0000 Encounter for general adult medical examination without abnormal findings: Secondary | ICD-10-CM | POA: Diagnosis not present

## 2016-08-29 LAB — URINALYSIS, ROUTINE W REFLEX MICROSCOPIC
Bilirubin Urine: NEGATIVE
HGB URINE DIPSTICK: NEGATIVE
KETONES UR: NEGATIVE
Nitrite: NEGATIVE
SPECIFIC GRAVITY, URINE: 1.02 (ref 1.000–1.030)
TOTAL PROTEIN, URINE-UPE24: NEGATIVE
URINE GLUCOSE: 100 — AB
UROBILINOGEN UA: 0.2 (ref 0.0–1.0)
pH: 5.5 (ref 5.0–8.0)

## 2016-08-29 LAB — CBC WITH DIFFERENTIAL/PLATELET
BASOS ABS: 0 10*3/uL (ref 0.0–0.1)
Basophils Relative: 0.6 % (ref 0.0–3.0)
EOS ABS: 0.1 10*3/uL (ref 0.0–0.7)
Eosinophils Relative: 1.5 % (ref 0.0–5.0)
HEMATOCRIT: 40.5 % (ref 36.0–46.0)
Hemoglobin: 13.5 g/dL (ref 12.0–15.0)
LYMPHS PCT: 31.2 % (ref 12.0–46.0)
Lymphs Abs: 2.5 10*3/uL (ref 0.7–4.0)
MCHC: 33.4 g/dL (ref 30.0–36.0)
MCV: 87.9 fl (ref 78.0–100.0)
MONOS PCT: 6 % (ref 3.0–12.0)
Monocytes Absolute: 0.5 10*3/uL (ref 0.1–1.0)
NEUTROS ABS: 4.8 10*3/uL (ref 1.4–7.7)
Neutrophils Relative %: 60.7 % (ref 43.0–77.0)
PLATELETS: 277 10*3/uL (ref 150.0–400.0)
RBC: 4.6 Mil/uL (ref 3.87–5.11)
RDW: 14.4 % (ref 11.5–15.5)
WBC: 7.9 10*3/uL (ref 4.0–10.5)

## 2016-08-29 LAB — HEPATIC FUNCTION PANEL
ALBUMIN: 4.1 g/dL (ref 3.5–5.2)
ALT: 12 U/L (ref 0–35)
AST: 12 U/L (ref 0–37)
Alkaline Phosphatase: 68 U/L (ref 39–117)
BILIRUBIN DIRECT: 0.1 mg/dL (ref 0.0–0.3)
TOTAL PROTEIN: 6.7 g/dL (ref 6.0–8.3)
Total Bilirubin: 0.3 mg/dL (ref 0.2–1.2)

## 2016-08-29 LAB — LIPID PANEL
CHOLESTEROL: 218 mg/dL — AB (ref 0–200)
HDL: 46.9 mg/dL (ref >39.00)
NonHDL: 171.24
Total CHOL/HDL Ratio: 5
Triglycerides: 236 mg/dL — ABNORMAL HIGH (ref 0.0–149.0)
VLDL: 47.2 mg/dL — AB (ref 0.0–40.0)

## 2016-08-29 LAB — BASIC METABOLIC PANEL
BUN: 16 mg/dL (ref 6–23)
CALCIUM: 10.1 mg/dL (ref 8.4–10.5)
CO2: 25 meq/L (ref 19–32)
CREATININE: 0.76 mg/dL (ref 0.40–1.20)
Chloride: 108 mEq/L (ref 96–112)
GFR: 96.27 mL/min (ref >60.00)
GLUCOSE: 175 mg/dL — AB (ref 70–99)
Potassium: 4.1 mEq/L (ref 3.5–5.1)
Sodium: 141 mEq/L (ref 135–145)

## 2016-08-29 LAB — MICROALBUMIN / CREATININE URINE RATIO
Creatinine,U: 133.5 mg/dL
MICROALB/CREAT RATIO: 1.1 mg/g (ref 0.0–30.0)
Microalb, Ur: 1.5 mg/dL (ref 0.0–1.9)

## 2016-08-29 LAB — TSH: TSH: 1.94 u[IU]/mL (ref 0.35–4.50)

## 2016-08-29 LAB — LDL CHOLESTEROL, DIRECT: Direct LDL: 152 mg/dL

## 2016-08-29 LAB — HEMOGLOBIN A1C: HEMOGLOBIN A1C: 6.8 % — AB (ref 4.6–6.5)

## 2016-08-29 MED ORDER — LORAZEPAM 0.5 MG PO TABS: 0.5000 mg | ORAL_TABLET | Freq: Two times a day (BID) | ORAL | 1 refills | Status: DC | PRN

## 2016-08-29 NOTE — Progress Notes (Signed)
Subjective:    Patient ID: Laura [name], female    DOB: [birth year], 71 y.o.   MRN: [id number]  HPI  Here for wellness and f/u;  Overall doing ok;  Pt denies Chest pain, worsening SOB, DOE, wheezing, orthopnea, PND, worsening LE edema, palpitations, dizziness or syncope.  Pt denies neurological change such as new headache, facial or extremity weakness.  Pt denies polydipsia, polyuria, or low sugar symptoms. Pt states overall good compliance with treatment and medications, good tolerability, and has been trying to follow appropriate diet.  Pt denies worsening depressive symptoms, suicidal ideation or panic. No fever, night sweats, wt loss, loss of appetite, or other constitutional symptoms.  Pt states good ability with ADL's, has low fall risk, home safety reviewed and adequate, no other significant changes in hearing or vision, and occasionally active with exercise. Has been walking every day this past wk, and plans to joint gym soon. Has optho appt cominmg up for blurred vision, as her reading glasses dont work as well recently  Seeing ortho for joint pain - neck, shoulders, hands , right knee with DJD, pain better after 3 cortisone to shoulders.  Still has left frozen shoulder, better with PT but still some limited ROM. Also has bilat feet feeling "like walking on grit" on floor, not clear why, has some tingling to it, but sitting makes all better, walking makes worse.   Denies worsening depressive symptoms, suicidal ideation, or panic; has ongoing anxiety, asks for lorazepam prn  Panic attack, last attack last night after very few recently. Past Medical History:  Diagnosis Date  . Anxiety   . Arthritis    back of neck, bones spurs on neck  . Cervical disc disease   . Diabetes mellitus   . Hyperlipidemia   . Hypertension   . Mucoid cyst of joint    right thumb  . Neuropathy   . Reflux   . Sleep apnea    wears CPAP nightly  . Vertigo    Past Surgical History:  Procedure Laterality Date    . BLADDER SURGERY    . BREAST SURGERY    . CHOLECYSTECTOMY    . fibroids removed     breast (both breasts)  . MASS EXCISION Right 06/26/2016   Procedure: EXCISION MUCOID TUMOR RIGHT THUMB, IP RIGHT THUMB;  Surgeon: Daryll Brod, MD;  Location: [place];  Service: Orthopedics;  Laterality: Right;  . PARTIAL HYSTERECTOMY      reports that she has never smoked. She has never used smokeless tobacco. She reports that she does not drink alcohol or use drugs. family history includes Diabetes in her father, maternal aunt, and sister; Hypertension in her other. Allergies  Allergen Reactions  . Sulfa Antibiotics     Tongue swells, hives, itching  . Codeine     itch  . Crestor [Rosuvastatin Calcium] Other (See Comments)    Did something to memory   . Hydrocodone-Homatropine Other (See Comments)    Vertigo *pt strongly prefers to never take*  . Keflex [Cephalexin] Diarrhea and Nausea And Vomiting  . Lipitor [Atorvastatin Calcium] Other (See Comments)    Makes weak   . Naproxen Other (See Comments)    Stomach cramps  . Prednisone Other (See Comments)    Nervous *pt strongly prefers to never be given prednisone*    Current Outpatient Prescriptions on File Prior to Visit  Medication Sig Dispense Refill  . acetaminophen (TYLENOL) 325 MG tablet Take 325 mg by mouth as needed  for mild pain, moderate pain, fever or headache.     Marland Kitchen amLODipine (NORVASC) 5 MG tablet Take 1 tablet (5 mg total) by mouth daily. Overdue for annual appt w/labsmust see MD for refills 30 tablet 0  . Blood Glucose Monitoring Suppl (ONE TOUCH ULTRA 2) w/Device KIT Use as directed 1 each 0  . glimepiride (AMARYL) 2 MG tablet Take 0.5 tablets (1 mg total) by mouth daily before breakfast. 45 tablet 3  . loperamide (IMODIUM A-D) 2 MG tablet Take 2 mg by mouth daily as needed for diarrhea or loose stools.     Marland Kitchen losartan (COZAAR) 100 MG tablet Take 1 tablet (100 mg total) by mouth daily. 90 tablet 3  . ONE TOUCH  ULTRA TEST test strip USE TO CHECK BLOOD SUGARS DAILY 50 each 3  . ONETOUCH DELICA LANCETS 09O MISC USE TO CHECK BLOOD SUGAR TWICE A DAY 100 each 3  . traMADol-acetaminophen (ULTRACET) 37.5-325 MG tablet TAKE 1 TABLET BY MOUTH EVERY 4 HOURS AS NEEDED FOR MODERATE PAIN 30 tablet 0   No current facility-administered medications on file prior to visit.    Review of Systems Constitutional: Negative for other unusual diaphoresis, sweats, appetite or weight changes HENT: Negative for other worsening hearing loss, ear pain, facial swelling, mouth sores or neck stiffness.   Eyes: Negative for other worsening pain, redness or other visual disturbance.  Respiratory: Negative for other stridor or swelling Cardiovascular: Negative for other palpitations or other chest pain  Gastrointestinal: Negative for worsening diarrhea or loose stools, blood in stool, distention or other pain Genitourinary: Negative for hematuria, flank pain or other change in urine volume.  Musculoskeletal: Negative for myalgias or other joint swelling.  Skin: Negative for other color change, or other wound or worsening drainage.  Neurological: Negative for other syncope or numbness. Hematological: Negative for other adenopathy or swelling Psychiatric/Behavioral: Negative for hallucinations, other worsening agitation, SI, self-injury, or new decreased concentration All other system neg per pt    Objective:   Physical Exam BP 136/84   Pulse 88   Temp 98 F (36.7 C)   Ht 5' 4"  (1.626 m)   Wt 199 lb (90.3 kg)   SpO2 98%   BMI 34.16 kg/m  VS noted,  Constitutional: Pt appears in NAD HENT: Head: NCAT.  Right Ear: External ear normal.  Left Ear: External ear normal.  Eyes: . Pupils are equal, round, and reactive to light. Conjunctivae and EOM are normal Nose: without d/c or deformity Neck: Neck supple. Gross normal ROM Cardiovascular: Normal rate and regular rhythm.   Pulmonary/Chest: Effort normal and breath sounds  without rales or wheezing.  Abd:  Soft, NT, ND, + BS, no organomegaly Neurological: Pt is alert. At baseline orientation, motor grossly intact Skin: Skin is warm. No rashes, other new lesions, no LE edema Psychiatric: Pt behavior is normal without agitation  No other exam findings    Assessment & Plan:

## 2016-08-29 NOTE — Patient Instructions (Addendum)
Please continue all other medications as before, and refills have been done if requested - the lorazepam  Please have the pharmacy call with any other refills you may need.  Please continue your efforts at being more active, low cholesterol diet, and weight control.  You are otherwise up to date with prevention measures today.  Please keep your appointments with your specialists as you may have planned  You will be contacted regarding the referral for: colonoscopy  Please go to the LAB in the Basement (turn left off the elevator) for the tests to be done today  You will be contacted by phone if any changes need to be made immediately.  Otherwise, you will receive a letter about your results with an explanation, but please check with MyChart first.  Please remember to sign up for MyChart if you have not done so, as this will be important to you in the future with finding out test results, communicating by private email, and scheduling acute appointments online when needed.  Please return in 6 months, or sooner if needed, with Lab testing done 3-5 days before

## 2016-08-30 LAB — HEPATITIS C ANTIBODY: HCV Ab: NONREACTIVE

## 2016-08-31 NOTE — Assessment & Plan Note (Signed)
stable overall by history and exam, recent data reviewed with pt, and pt to continue medical treatment as before,  to f/u any worsening symptoms or concerns Lab Results  Component Value Date   HGBA1C 6.8 (H) 08/29/2016   

## 2016-08-31 NOTE — Assessment & Plan Note (Signed)

## 2016-09-02 ENCOUNTER — Ambulatory Visit
Admission: RE | Admit: 2016-09-02 | Discharge: 2016-09-02 | Disposition: A | Discharge status: Home / Self Care | Payer: Medicare Other | Source: Ambulatory Visit | Attending: Internal Medicine | Admitting: Internal Medicine

## 2016-09-02 DIAGNOSIS — Z1231 Encounter for screening mammogram for malignant neoplasm of breast: Secondary | ICD-10-CM | POA: Diagnosis not present

## 2016-09-10 ENCOUNTER — Other Ambulatory Visit: Payer: Self-pay | Admitting: Internal Medicine

## 2016-09-10 ENCOUNTER — Ambulatory Visit (AMBULATORY_SURGERY_CENTER): Payer: Self-pay | Admitting: *Deleted

## 2016-09-10 VITALS — Ht 64.0 in | Wt 198.0 lb

## 2016-09-10 DIAGNOSIS — Z86010 Personal history of colonic polyps: Secondary | ICD-10-CM

## 2016-09-10 MED ORDER — NA SULFATE-K SULFATE-MG SULF 17.5-3.13-1.6 GM/177ML PO SOLN: ORAL | 0 refills | Status: DC

## 2016-09-10 NOTE — Progress Notes (Signed)
Patient denies any allergies to eggs or soy. Patient denies any problems with anesthesia/sedation. Patient denies any oxygen use at home and does not take any diet/weight loss medications. EMMI education assisgned to patient on colonoscopy, this was explained and instructions given to patient. 

## 2016-09-16 ENCOUNTER — Encounter: Payer: Self-pay | Admitting: Internal Medicine

## 2016-09-16 ENCOUNTER — Encounter: Payer: Self-pay | Admitting: Gastroenterology

## 2016-09-19 ENCOUNTER — Encounter: Payer: Self-pay | Admitting: Gastroenterology

## 2016-09-19 ENCOUNTER — Ambulatory Visit (AMBULATORY_SURGERY_CENTER): Payer: Medicare Other | Admitting: Gastroenterology

## 2016-09-19 ENCOUNTER — Telehealth: Payer: Self-pay | Admitting: Gastroenterology

## 2016-09-19 VITALS — BP 120/71 | HR 69 | Temp 99.1°F | Resp 17 | Ht 64.0 in | Wt 198.0 lb

## 2016-09-19 DIAGNOSIS — Z86010 Personal history of colonic polyps: Secondary | ICD-10-CM | POA: Diagnosis present

## 2016-09-19 MED ORDER — SODIUM CHLORIDE 0.9 % IV SOLN: 500.0000 mL | INTRAVENOUS | Status: DC

## 2016-09-19 NOTE — Progress Notes (Signed)
Report to PACU, RN, vss, BBS= Clear.  

## 2016-09-19 NOTE — Patient Instructions (Signed)
YOU HAD AN ENDOSCOPIC PROCEDURE TODAY AT [facility]:   Refer to the procedure report that was given to you for any specific questions about what was found during the examination.  If the procedure report does not answer your questions, please call your gastroenterologist to clarify.  If you requested that your care partner not be given the details of your procedure findings, then the procedure report has been included in a sealed envelope for you to review at your convenience later.  YOU SHOULD EXPECT: Some feelings of bloating in the abdomen. Passage of more gas than usual.  Walking can help get rid of the air that was put into your GI tract during the procedure and reduce the bloating. If you had a lower endoscopy (such as a colonoscopy or flexible sigmoidoscopy) you may notice spotting of blood in your stool or on the toilet paper. If you underwent a bowel prep for your procedure, you may not have a normal bowel movement for a few days.  Please Note:  You might notice some irritation and congestion in your nose or some drainage.  This is from the oxygen used during your procedure.  There is no need for concern and it should clear up in a day or so.  SYMPTOMS TO REPORT IMMEDIATELY:   Following lower endoscopy (colonoscopy or flexible sigmoidoscopy):  Excessive amounts of blood in the stool  Significant tenderness or worsening of abdominal pains  Swelling of the abdomen that is new, acute  Fever of 100F or higher  For urgent or emergent issues, a gastroenterologist can be reached at any hour by calling [phone].   DIET:  We do recommend a small meal at first, but then you may proceed to your regular diet.  Drink plenty of fluids but you should avoid alcoholic beverages for 24 hours.  ACTIVITY:  You should plan to take it easy for the rest of today and you should NOT DRIVE or use heavy machinery until tomorrow (because of the sedation medicines used during the test).     FOLLOW UP: Our staff will call the number listed on your records the next business day following your procedure to check on you and address any questions or concerns that you may have regarding the information given to you following your procedure. If we do not reach you, we will leave a message.  However, if you are feeling well and you are not experiencing any problems, there is no need to return our call.  We will assume that you have returned to your regular daily activities without incident.  If any biopsies were taken you will be contacted by phone or by letter within the next 1-3 weeks.  Please call us at [phone] if you have not heard about the biopsies in 3 weeks.   No further Colonoscopy neeeded   SIGNATURES/CONFIDENTIALITY: You and/or your care partner have signed paperwork which will be entered into your electronic medical record.  These signatures attest to the fact that that the information above on your After Visit Summary has been reviewed and is understood.  Full responsibility of the confidentiality of this discharge information lies with you and/or your care-partner.

## 2016-09-19 NOTE — Telephone Encounter (Signed)
Oncall note: Patient called around 10 PM on 09/18/16 complaining that she is having diarrhea after drinking the 1st  dose of split prep and she didn't want to drink any more prep. Reassured and advised patient to complete the prep according to instructions and finish the split dose. Patient became extremely upset, didn't want to continue to conversation. She said she already wasted too much money for the colonoscopy and doesn't want any more diarrhea or leaky rectum. She has to drive 35 mins to come for the procedure and she cant come in like this. Patient was screaming through out and didn't want to hear anything I was saying and hung up the phone.  She paged back requesting to speak to her Dr, called answering service and informed them that I am oncall doctor for the night and will request Dr Ardis Hughs to call the patient in the morning.   Damaris Hippo , MD [phone] Mon-Fri 8a-5p [phone] after 5p, weekends, holidays

## 2016-09-19 NOTE — Op Note (Signed)
[facility] Patient Name: Laura [name] Procedure Date: 09/19/2016 9:42 AM MRN: [id number] Endoscopist: Milus Banister , MD Age: 71 Referring MD:  Date of Birth: 1945-07-20 Gender: Female Account #: [id number] Procedure:                Colonoscopy Indications:              High risk colon cancer surveillance: Personal                            history of colonic polyps; Single subCM adenoma                            removed 2013 Medicines:                Monitored Anesthesia Care Procedure:                Pre-Anesthesia Assessment:                           - Prior to the procedure, a History and Physical                            was performed, and patient medications and                            allergies were reviewed. The patient's tolerance of                            previous anesthesia was also reviewed. The risks                            and benefits of the procedure and the sedation                            options and risks were discussed with the patient.                            All questions were answered, and informed consent                            was obtained. Prior Anticoagulants: The patient has                            taken no previous anticoagulant or antiplatelet                            agents. ASA Grade Assessment: II - A patient with                            mild systemic disease. After reviewing the risks                            and benefits, the patient was deemed in  satisfactory condition to undergo the procedure.                           After obtaining informed consent, the colonoscope                            was passed under direct vision. Throughout the                            procedure, the patient's blood pressure, pulse, and                            oxygen saturations were monitored continuously. The                            Model CF-HQ190L [id number]) scope was introduced                         through the anus and advanced to the the cecum,                            identified by appendiceal orifice and ileocecal                            valve. The colonoscopy was performed without                            difficulty. The patient tolerated the procedure                            well. The quality of the bowel preparation was                            good. The ileocecal valve, appendiceal orifice, and                            rectum were photographed. Scope In: 9:43:35 AM Scope Out: 9:53:20 AM Scope Withdrawal Time: 0 hours 7 minutes 5 seconds  Total Procedure Duration: 0 hours 9 minutes 45 seconds  Findings:                 The entire examined colon appeared normal on direct                            and retroflexion views.                           No polyps or cancers. Complications:            No immediate complications. Estimated blood loss:                            None. Estimated Blood Loss:     Estimated blood loss: none. Impression:               - The entire examined colon is normal on direct and  retroflexion views.                           - No polyps or cancers Recommendation:           - Patient has a contact number available for                            emergencies. The signs and symptoms of potential                            delayed complications were discussed with the                            patient. Return to normal activities tomorrow.                            Written discharge instructions were provided to the                            patient.                           - Resume previous diet.                           - Continue present medications.                           - You do not need any further colon cancer                            screening tests (including stool testing). These                            types of tests generally stop around age 4-80. Milus Banister,  MD 09/19/2016 9:55:13 AM This report has been signed electronically.

## 2016-09-19 NOTE — Progress Notes (Signed)
Pt's states no medical or surgical changes since previsit or office visit. 

## 2016-09-22 ENCOUNTER — Telehealth: Payer: Self-pay | Admitting: *Deleted

## 2016-09-22 NOTE — Telephone Encounter (Signed)
  Follow up Call-  Call back number 09/19/2016  Post procedure Call Back phone  # [phone]  Permission to leave phone message Yes  Some recent data might be hidden     Patient questions:  Do you have a fever, pain , or abdominal swelling? No. Pain Score  0 *  Have you tolerated food without any problems? Yes.    Have you been able to return to your normal activities? Yes.    Do you have any questions about your discharge instructions: Diet   No. Medications  No. Follow up visit  No.  Do you have questions or concerns about your Care? No.  Actions: * If pain score is 4 or above: No action needed, pain <4.

## 2016-09-24 DIAGNOSIS — H52203 Unspecified astigmatism, bilateral: Secondary | ICD-10-CM | POA: Diagnosis not present

## 2016-09-24 DIAGNOSIS — H5203 Hypermetropia, bilateral: Secondary | ICD-10-CM | POA: Diagnosis not present

## 2016-09-24 DIAGNOSIS — E119 Type 2 diabetes mellitus without complications: Secondary | ICD-10-CM | POA: Diagnosis not present

## 2016-09-24 LAB — HM DIABETES EYE EXAM

## 2016-11-11 ENCOUNTER — Encounter: Payer: Self-pay | Admitting: Internal Medicine

## 2016-11-13 MED ORDER — LOSARTAN POTASSIUM 100 MG PO TABS: 100.0000 mg | ORAL_TABLET | Freq: Every day | ORAL | 3 refills | Status: DC

## 2016-11-13 MED ORDER — LOSARTAN POTASSIUM 50 MG PO TABS: 50.0000 mg | ORAL_TABLET | Freq: Every day | ORAL | 3 refills | Status: DC

## 2016-11-13 NOTE — Addendum Note (Signed)
Addended by: Juliet Rude on: 11/13/2016 10:38 AM   Modules accepted: Orders

## 2016-11-13 NOTE — Addendum Note (Signed)
Addended by: Biagio Borg on: 11/13/2016 01:00 PM   Modules accepted: Orders

## 2016-12-01 ENCOUNTER — Ambulatory Visit (INDEPENDENT_AMBULATORY_CARE_PROVIDER_SITE_OTHER): Payer: Medicare Other | Admitting: Internal Medicine

## 2016-12-01 ENCOUNTER — Encounter: Payer: Self-pay | Admitting: Internal Medicine

## 2016-12-01 VITALS — BP 138/72 | HR 85 | Temp 98.0°F | Ht 64.0 in | Wt 201.0 lb

## 2016-12-01 DIAGNOSIS — H538 Other visual disturbances: Secondary | ICD-10-CM

## 2016-12-01 DIAGNOSIS — R42 Dizziness and giddiness: Secondary | ICD-10-CM | POA: Diagnosis not present

## 2016-12-01 DIAGNOSIS — I10 Essential (primary) hypertension: Secondary | ICD-10-CM | POA: Diagnosis not present

## 2016-12-01 DIAGNOSIS — Z23 Encounter for immunization: Secondary | ICD-10-CM | POA: Diagnosis not present

## 2016-12-01 NOTE — Assessment & Plan Note (Signed)
History is wandering and hard to ellucidate if all symptoms orthostatic or vertiginous. She has some of both but she relates to her losartan although she has not stopped taking this. Will ask her to stop losartan and return in 2 weeks with PCP. If symptoms still then needs further evaluation. Did not have orthostatic symptoms in the office.

## 2016-12-01 NOTE — Assessment & Plan Note (Signed)
She is asked to stop losartan today and see pcp in 2 weeks. Will not adjust meds otherwise today. She has had dizziness which is thought to be medication side effect which improved with dosage reduction but did not resolve.

## 2016-12-01 NOTE — Assessment & Plan Note (Signed)
She is asked to see her eye specialist. No changes on exam today. She will stop losartan to see if that helps. Encouraged to use saline drops otc.

## 2016-12-01 NOTE — Patient Instructions (Addendum)
We would like you to come back to Dr. Jenny Reichmann in about 2 weeks to check in. Stop taking the losartan.

## 2016-12-01 NOTE — Progress Notes (Signed)
   Subjective:    Patient ID: Laura [name], female    DOB: [birth year], 71 y.o.   MRN: [id number]  HPI The patient is a 71 YO female coming in for several concerns including dizziness (going on since starting losartan, sometimes happens with standing, she states fairly severe, lasts several seconds, denies falls, has not tried anything for it, feels like room is spinning, denies lightheadedness), and blurred vision (started with starting losartan, had eye exam this year but before her symptoms started, has not seen an eye doctor since that time, denies drainage or crusting, stable in the last 3 months), and her blood pressure (she is taking amlodipine and losartan, denies chest pains, I having dizziness and changes, used to be on valsartan, denied problems with this).   Review of Systems  Constitutional: Positive for activity change and fatigue.  HENT: Negative.   Eyes: Positive for visual disturbance.  Respiratory: Negative for cough, chest tightness and shortness of breath.   Cardiovascular: Negative for chest pain, palpitations and leg swelling.  Gastrointestinal: Negative for abdominal distention, abdominal pain, constipation, diarrhea, nausea and vomiting.  Musculoskeletal: Positive for gait problem.  Skin: Negative.   Neurological: Positive for dizziness, weakness and light-headedness.  Psychiatric/Behavioral: Negative.       Objective:   Physical Exam  Constitutional: She is oriented to person, place, and time. She appears well-developed and well-nourished.  HENT:  Head: Normocephalic and atraumatic.  Eyes: EOM are normal.  Neck: Normal range of motion.  Cardiovascular: Normal rate and regular rhythm.  Pulmonary/Chest: Effort normal and breath sounds normal. No respiratory distress. She has no wheezes. She has no rales.  Abdominal: Soft. Bowel sounds are normal. She exhibits no distension. There is no tenderness. There is no rebound.  Musculoskeletal: She exhibits no edema.    Neurological: She is alert and oriented to person, place, and time. Coordination normal.  No orthostatic symptoms or dizziness on exam  Skin: Skin is warm and dry.  Psychiatric:  Wandering historian   Vitals:   12/01/16 0905  BP: 138/72  Pulse: 85  Temp: 98 F (36.7 C)  TempSrc: Oral  SpO2: 97%  Weight: 201 lb (91.2 kg)  Height: 5\' 4"  (1.626 m)      Assessment & Plan:  Flu shot given at visit

## 2016-12-10 ENCOUNTER — Ambulatory Visit: Payer: Self-pay | Admitting: Internal Medicine

## 2016-12-12 ENCOUNTER — Encounter: Payer: Self-pay | Admitting: Internal Medicine

## 2016-12-12 ENCOUNTER — Ambulatory Visit (INDEPENDENT_AMBULATORY_CARE_PROVIDER_SITE_OTHER): Payer: Medicare Other | Admitting: Internal Medicine

## 2016-12-12 VITALS — BP 136/66 | HR 84 | Temp 98.3°F | Ht 64.0 in | Wt 200.2 lb

## 2016-12-12 DIAGNOSIS — H1013 Acute atopic conjunctivitis, bilateral: Secondary | ICD-10-CM | POA: Diagnosis not present

## 2016-12-12 DIAGNOSIS — R42 Dizziness and giddiness: Secondary | ICD-10-CM | POA: Diagnosis not present

## 2016-12-12 DIAGNOSIS — I10 Essential (primary) hypertension: Secondary | ICD-10-CM

## 2016-12-12 DIAGNOSIS — H1010 Acute atopic conjunctivitis, unspecified eye: Secondary | ICD-10-CM | POA: Insufficient documentation

## 2016-12-12 DIAGNOSIS — E1140 Type 2 diabetes mellitus with diabetic neuropathy, unspecified: Secondary | ICD-10-CM

## 2016-12-12 MED ORDER — AZELASTINE HCL 0.05 % OP SOLN: 1.0000 [drp] | Freq: Two times a day (BID) | OPHTHALMIC | 12 refills | Status: DC

## 2016-12-12 NOTE — Patient Instructions (Addendum)
Please take all new medication as prescribed - the eye drops for allergies  Please continue all other medications as before, and refills have been done if requested.  Please have the pharmacy call with any other refills you may need.  Please keep your appointments with your specialists as you may have planned

## 2016-12-12 NOTE — Progress Notes (Signed)
Subjective:    Patient ID: Laura [name], female    DOB: 1945/10/27, 71 y.o.   MRN: [id number]  HPI  Here to f/u after recent dizziness and vision changes have cleared up with stopping the losartan 50 which she does not apparently need after recent diet change from fast food to fresh foods and veggies.  Has not been able to lose wt though.  Pt denies chest pain, increased sob or doe, wheezing, orthopnea, PND, increased LE swelling, palpitations, or syncope.  Pt denies new neurological symptoms such as new headache, or facial or extremity weakness or numbness   Pt denies polydipsia, polyuria, BP Readings from Last 3 Encounters:  12/12/16 136/66  12/01/16 138/72  09/19/16 120/71   Wt Readings from Last 3 Encounters:  12/12/16 200 lb 3.2 oz (90.8 kg)  12/01/16 201 lb (91.2 kg)  09/19/16 198 lb (89.8 kg)  Also c/o 2 wks bilateral eye itching, burning and mild d/c occasionally without fever, other pain, or vision change.  No ST, HA, cough or wheezing.  Past Medical History:  Diagnosis Date  . Anxiety   . Arthritis    back of neck, bones spurs on neck  . Cataract   . Cervical disc disease   . Diabetes mellitus   . Hyperlipidemia   . Hypertension   . Mucoid cyst of joint    right thumb  . Neuropathy   . Reflux   . Sleep apnea    wears CPAP nightly  . Vertigo    Past Surgical History:  Procedure Laterality Date  . BLADDER SURGERY    . BREAST EXCISIONAL BIOPSY Bilateral   . BREAST SURGERY    . CHOLECYSTECTOMY    . fibroids removed     breast (both breasts)  . MASS EXCISION Right 06/26/2016   Procedure: EXCISION MUCOID TUMOR RIGHT THUMB, IP RIGHT THUMB;  Surgeon: Daryll Brod, MD;  Location: [facility];  Service: Orthopedics;  Laterality: Right;  . PARTIAL HYSTERECTOMY      reports that  has never smoked. she has never used smokeless tobacco. She reports that she does not drink alcohol or use drugs. family history includes Diabetes in her father, maternal aunt, and  sister; Hypertension in her other. Allergies  Allergen Reactions  . Hydrocodone-Homatropine Other (See Comments)    Vertigo *pt strongly prefers to never take*  . Sulfa Antibiotics     Tongue swells, hives, itching  . Codeine     itch  . Crestor [Rosuvastatin Calcium] Other (See Comments)    Did something to memory   . Keflex [Cephalexin] Diarrhea and Nausea And Vomiting  . Lipitor [Atorvastatin Calcium] Other (See Comments)    Makes weak   . Naproxen Other (See Comments)    Stomach cramps  . Prednisone Other (See Comments)    Nervous *pt strongly prefers to never be given prednisone*    Current Outpatient Medications on File Prior to Visit  Medication Sig Dispense Refill  . acetaminophen (TYLENOL) 325 MG tablet Take 325 mg by mouth as needed for mild pain, moderate pain, fever or headache.     Marland Kitchen amLODipine (NORVASC) 5 MG tablet Take 1 tablet (5 mg total) by mouth daily. 90 tablet 3  . Blood Glucose Monitoring Suppl (ONE TOUCH ULTRA 2) w/Device KIT Use as directed 1 each 0  . glimepiride (AMARYL) 2 MG tablet Take 0.5 tablets (1 mg total) by mouth daily before breakfast. 45 tablet 3  . LORazepam (ATIVAN) 0.5 MG tablet  Take 1 tablet (0.5 mg total) by mouth 2 (two) times daily as needed for anxiety. 30 tablet 1  . ONE TOUCH ULTRA TEST test strip USE TO CHECK BLOOD SUGARS DAILY 50 each 3  . ONETOUCH DELICA LANCETS 87F MISC USE TO CHECK BLOOD SUGAR TWICE A DAY 100 each 3  . traMADol-acetaminophen (ULTRACET) 37.5-325 MG tablet TAKE 1 TABLET BY MOUTH EVERY 4 HOURS AS NEEDED FOR MODERATE PAIN 30 tablet 0  . loperamide (IMODIUM A-D) 2 MG tablet Take 2 mg by mouth daily as needed for diarrhea or loose stools.      No current facility-administered medications on file prior to visit.    Review of Systems  Constitutional: Negative for other unusual diaphoresis or sweats HENT: Negative for ear discharge or swelling Eyes: Negative for other worsening visual disturbances Respiratory:  Negative for stridor or other swelling  Gastrointestinal: Negative for worsening distension or other blood Genitourinary: Negative for retention or other urinary change Musculoskeletal: Negative for other MSK pain or swelling Skin: Negative for color change or other new lesions Neurological: Negative for worsening tremors and other numbness  Psychiatric/Behavioral: Negative for worsening agitation or other fatigue All other system neg per pt    Objective:   Physical Exam BP 136/66 (BP Location: Left Arm, Patient Position: Sitting, Cuff Size: Large)   Pulse 84   Temp 98.3 F (36.8 C) (Oral)   Ht _0  (1.626 m)   Wt 200 lb 3.2 oz (90.8 kg)   SpO2 98%   BMI 34.36 kg/m  VS noted,  Constitutional: Pt appears in NAD HENT: Head: NCAT.  Right Ear: External ear normal.  Left Ear: External ear normal.  Eyes: . Pupils are equal, round, and reactive to light. Conjunctivae with bilat mild erythema and slight clearish d/c, and EOM are normal Nose: without d/c or deformity Neck: Neck supple. Gross normal ROM Cardiovascular: Normal rate and regular rhythm.   Pulmonary/Chest: Effort normal and breath sounds without rales or wheezing.  Abd:  Soft, NT, ND, + BS, no organomegaly Neurological: Pt is alert. At baseline orientation, motor grossly intact Skin: Skin is warm. No rashes, other new lesions, no LE edema Psychiatric: Pt behavior is normal without agitation  No other exam findings  Lab Results  Component Value Date   HGBA1C 6.8 (H) 08/29/2016      Assessment & Plan:

## 2016-12-13 NOTE — Assessment & Plan Note (Signed)
Lab Results  Component Value Date   HGBA1C 6.8 (H) 08/29/2016  stable overall by history and exam, recent data reviewed with pt, and pt to continue medical treatment as before,  to f/u any worsening symptoms or concerns

## 2016-12-13 NOTE — Assessment & Plan Note (Signed)
Much improved, and seems likely pt was overcontrolled with HTN tx

## 2016-12-13 NOTE — Assessment & Plan Note (Signed)
Mild to mod, for optivar asd prn,  to f/u any worsening symptoms or concerns

## 2016-12-13 NOTE — Assessment & Plan Note (Signed)
BP Readings from Last 3 Encounters:  12/12/16 136/66  12/01/16 138/72  09/19/16 120/71  stable overall by history and exam, recent data reviewed with pt, and pt to continue medical treatment as before,  to f/u any worsening symptoms or concerns

## 2017-02-16 ENCOUNTER — Other Ambulatory Visit: Payer: Self-pay | Admitting: Internal Medicine

## 2017-02-17 ENCOUNTER — Telehealth: Payer: Self-pay | Admitting: Internal Medicine

## 2017-02-17 MED ORDER — BENZONATATE 100 MG PO CAPS: 100.0000 mg | ORAL_CAPSULE | Freq: Three times a day (TID) | ORAL | 1 refills | Status: DC | PRN

## 2017-02-17 NOTE — Telephone Encounter (Signed)
We cannot provide hydrocodone or codeine due to allergies  Ok for tessalon perle   - done erx

## 2017-02-17 NOTE — Telephone Encounter (Addendum)
Tried calling pt no answer LMOM w/MD response below. Also sent CRM msg for fyi if pt return call bck.....Laura [name]

## 2017-02-17 NOTE — Telephone Encounter (Signed)
Copied from [facility] [id number]. Topic: Quick Communication - See Telephone Encounter >> Feb 17, 2017  9:32 AM Antonieta Iba C wrote:    CRM for notification. See Telephone encounter for: pt called in because she said that she have a cough and would like to know if provider would send something in to pharmacy for her? Pt says that she has had cough since January 23, pt has been taking OTC medications.     Pharmacy: [facility], [place] [address]  02/17/17.

## 2017-02-23 ENCOUNTER — Ambulatory Visit (INDEPENDENT_AMBULATORY_CARE_PROVIDER_SITE_OTHER)
Admission: RE | Admit: 2017-02-23 | Discharge: 2017-02-23 | Disposition: A | Discharge status: Home / Self Care | Payer: Medicare Other | Source: Ambulatory Visit | Attending: Internal Medicine | Admitting: Internal Medicine

## 2017-02-23 ENCOUNTER — Other Ambulatory Visit: Payer: Self-pay

## 2017-02-23 ENCOUNTER — Ambulatory Visit (INDEPENDENT_AMBULATORY_CARE_PROVIDER_SITE_OTHER): Payer: Medicare Other | Admitting: Internal Medicine

## 2017-02-23 ENCOUNTER — Encounter: Payer: Self-pay | Admitting: Internal Medicine

## 2017-02-23 VITALS — BP 144/92 | HR 87 | Temp 98.7°F | Ht 64.0 in | Wt 197.0 lb

## 2017-02-23 DIAGNOSIS — E1140 Type 2 diabetes mellitus with diabetic neuropathy, unspecified: Secondary | ICD-10-CM

## 2017-02-23 DIAGNOSIS — Z23 Encounter for immunization: Secondary | ICD-10-CM | POA: Diagnosis not present

## 2017-02-23 DIAGNOSIS — F418 Other specified anxiety disorders: Secondary | ICD-10-CM | POA: Diagnosis not present

## 2017-02-23 DIAGNOSIS — R05 Cough: Secondary | ICD-10-CM | POA: Diagnosis not present

## 2017-02-23 DIAGNOSIS — R059 Cough, unspecified: Secondary | ICD-10-CM

## 2017-02-23 DIAGNOSIS — I10 Essential (primary) hypertension: Secondary | ICD-10-CM

## 2017-02-23 DIAGNOSIS — Z0001 Encounter for general adult medical examination with abnormal findings: Secondary | ICD-10-CM | POA: Diagnosis not present

## 2017-02-23 MED ORDER — DOXYCYCLINE HYCLATE 100 MG PO TABS: 100.0000 mg | ORAL_TABLET | Freq: Two times a day (BID) | ORAL | 0 refills | Status: DC

## 2017-02-23 NOTE — Assessment & Plan Note (Signed)

## 2017-02-23 NOTE — Assessment & Plan Note (Signed)
stable overall by history and exam, recent data reviewed with pt, and pt to continue medical treatment as before,  to f/u any worsening symptoms or concerns Lab Results  Component Value Date   HGBA1C 6.8 (H) 08/29/2016

## 2017-02-23 NOTE — Assessment & Plan Note (Signed)
BP Readings from Last 3 Encounters:  02/23/17 (!) 144/92  12/12/16 136/66  12/01/16 138/72  mild elevated, likely situiatonal, cont same to and f/u BP at home and next visit

## 2017-02-23 NOTE — Assessment & Plan Note (Addendum)
stable overall by history and exam, and pt to continue medical treatment as before,  to f/u any worsening symptoms or concerns 

## 2017-02-23 NOTE — Patient Instructions (Addendum)
You had the Pneumovax pneumonia shot today  Please take all new medication as prescribed - the antibiotic  You can also take Delsym OTC for cough, and/or Mucinex (or it's generic off brand) for congestion, and tylenol as needed for pain.  Please continue all other medications as before, and refills have been done if requested.  Please have the pharmacy call with any other refills you may need.  Please continue your efforts at being more active, low cholesterol diet, and weight control.  You are otherwise up to date with prevention measures today.  Please keep your appointments with your specialists as you may have planned  Please go to the XRAY Department in the Basement (go straight as you get off the elevator) for the x-ray testing  Please go to the LAB in the Basement (turn left off the elevator) for the tests to be done today  You will be contacted by phone if any changes need to be made immediately.  Otherwise, you will receive a letter about your results with an explanation, but please check with MyChart first.  Please remember to sign up for MyChart if you have not done so, as this will be important to you in the future with finding out test results, communicating by private email, and scheduling acute appointments online when needed.  Please return in 6 months, or sooner if needed, with Lab testing done 3-5 days before

## 2017-02-23 NOTE — Assessment & Plan Note (Addendum)
Mild to mod, c/w bronchitis vs pna, for cxr,  for antibx course, predpac asd, to f/u any worsening symptoms or concerns  In addition to the time spent performing CPE, I spent an additional 15 minutes face to face,in which greater than 50% of this time was spent in counseling and coordination of care for patient's illness as documented, including the differential dx, treatment, further evaluation and other management of cough, DM, HTN, and depression with anxiety

## 2017-02-23 NOTE — Progress Notes (Signed)
Subjective:    Patient ID: Laura [name], female    DOB: 1945/06/25, 72 y.o.   MRN: [id number]  HPI   Here for wellness and f/u;  Overall doing ok;  Pt denies Chest pain, worsening SOB, DOE, wheezing, orthopnea, PND, worsening LE edema, palpitations, dizziness or syncope.  Pt denies neurological change such as new headache, facial or extremity weakness.  Pt denies polydipsia, polyuria, or low sugar symptoms. Pt states overall good compliance with treatment and medications, good tolerability, and has been trying to follow appropriate diet. .. No fever, night sweats, wt loss, loss of appetite, or other constitutional symptoms.  Pt states good ability with ADL's, has low fall risk, home safety reviewed and adequate, no other significant changes in hearing or vision, and not active with exercise.  Here with acute onset mild to mod 2-3 wks since jan 24 ST, HA, general weakness and malaise, with prod cough greenish sputum, but Pt denies chest pain, increased sob or doe, wheezing, orthopnea, PND, increased LE swelling, palpitations, dizziness or syncope. No better with tea and soups.  Sister who visited her was ill prior to pt onset symptoms.  OTC Delsym has helped some, as well as a nasal spray approved by her insurance drug plan she canont recall name.  Still has significant prod cough and nasal congestion with drainage colored but not sure how.  No recent wheezing per pt but did have some what sounds like bronchial congestant and a kind of loud breathing.    Pt denies worsening depressive symptoms, suicidal ideation or panic, though has plenty of stress and lower mood with 2 close family members being ill Past Medical History:  Diagnosis Date  . Anxiety   . Arthritis    back of neck, bones spurs on neck  . Cataract   . Cervical disc disease   . Diabetes mellitus   . Hyperlipidemia   . Hypertension   . Mucoid cyst of joint    right thumb  . Neuropathy   . Reflux   . Sleep apnea    wears CPAP  nightly  . Vertigo    Past Surgical History:  Procedure Laterality Date  . BLADDER SURGERY    . BREAST EXCISIONAL BIOPSY Bilateral   . BREAST SURGERY    . CHOLECYSTECTOMY    . fibroids removed     breast (both breasts)  . MASS EXCISION Right 06/26/2016   Procedure: EXCISION MUCOID TUMOR RIGHT THUMB, IP RIGHT THUMB;  Surgeon: Daryll Brod, MD;  Location: [place];  Service: Orthopedics;  Laterality: Right;  . PARTIAL HYSTERECTOMY      reports that  has never smoked. she has never used smokeless tobacco. She reports that she does not drink alcohol or use drugs. family history includes Diabetes in her father, maternal aunt, and sister; Hypertension in her other. Allergies  Allergen Reactions  . Hydrocodone-Homatropine Other (See Comments)    Vertigo *pt strongly prefers to never take*  . Sulfa Antibiotics     Tongue swells, hives, itching  . Codeine     itch  . Crestor [Rosuvastatin Calcium] Other (See Comments)    Did something to memory   . Keflex [Cephalexin] Diarrhea and Nausea And Vomiting  . Lipitor [Atorvastatin Calcium] Other (See Comments)    Makes weak   . Naproxen Other (See Comments)    Stomach cramps  . Prednisone Other (See Comments)    Nervous *pt strongly prefers to never be given prednisone*  Current Outpatient Medications on File Prior to Visit  Medication Sig Dispense Refill  . acetaminophen (TYLENOL) 325 MG tablet Take 325 mg by mouth as needed for mild pain, moderate pain, fever or headache.     Marland Kitchen amLODipine (NORVASC) 5 MG tablet Take 1 tablet (5 mg total) by mouth daily. 90 tablet 3  . azelastine (OPTIVAR) 0.05 % ophthalmic solution Place 1 drop into both eyes 2 (two) times daily. 6 mL 12  . benzonatate (TESSALON PERLES) 100 MG capsule Take 1 capsule (100 mg total) by mouth 3 (three) times daily as needed for cough. 60 capsule 1  . Blood Glucose Monitoring Suppl (ONE TOUCH ULTRA 2) w/Device KIT Use as directed 1 each 0  . glimepiride  (AMARYL) 2 MG tablet TAKE ONE-HALF TABLET BY MOUTH ONCE DAILYBEFORE BREAKFAST 45 tablet 3  . loperamide (IMODIUM A-D) 2 MG tablet Take 2 mg by mouth daily as needed for diarrhea or loose stools.     Marland Kitchen LORazepam (ATIVAN) 0.5 MG tablet Take 1 tablet (0.5 mg total) by mouth 2 (two) times daily as needed for anxiety. 30 tablet 1  . ONE TOUCH ULTRA TEST test strip USE TO CHECK BLOOD SUGARS DAILY 50 each 3  . ONETOUCH DELICA LANCETS 83A MISC USE TO CHECK BLOOD SUGAR TWICE A DAY 100 each 3  . traMADol-acetaminophen (ULTRACET) 37.5-325 MG tablet TAKE 1 TABLET BY MOUTH EVERY 4 HOURS AS NEEDED FOR MODERATE PAIN 30 tablet 0   No current facility-administered medications on file prior to visit.    Review of Systems Constitutional: Negative for other unusual diaphoresis, sweats, appetite or weight changes HENT: Negative for other worsening hearing loss, ear pain, facial swelling, mouth sores or neck stiffness.   Eyes: Negative for other worsening pain, redness or other visual disturbance.  Respiratory: Negative for other stridor or swelling Cardiovascular: Negative for other palpitations or other chest pain  Gastrointestinal: Negative for worsening diarrhea or loose stools, blood in stool, distention or other pain Genitourinary: Negative for hematuria, flank pain or other change in urine volume.  Musculoskeletal: Negative for myalgias or other joint swelling.  Skin: Negative for other color change, or other wound or worsening drainage.  Neurological: Negative for other syncope or numbness. Hematological: Negative for other adenopathy or swelling Psychiatric/Behavioral: Negative for hallucinations, other worsening agitation, SI, self-injury, or new decreased concentration All other system neg per pt    Objective:   Physical Exam BP (!) 144/92   Pulse 87   Temp 98.7 F (37.1 C) (Oral)   Ht 5' 4"  (1.626 m)   Wt 197 lb (89.4 kg)   SpO2 98%   BMI 33.81 kg/m  VS noted,  Constitutional: Pt is  oriented to person, place, and time. Appears well-developed and well-nourished, in no significant distress and comfortable Head: Normocephalic and atraumatic  Eyes: Conjunctivae and EOM are normal. Pupils are equal, round, and reactive to light Right Ear: External ear normal without discharge Left Ear: External ear normal without discharge Nose: Nose without discharge or deformity Mouth/Throat: Oropharynx is without other ulcerations and moist  Neck: Normal range of motion. Neck supple. No JVD present. No tracheal deviation present or significant neck LA or mass Cardiovascular: Normal rate, regular rhythm, normal heart sounds and intact distal pulses.   Pulmonary/Chest: WOB normal and breath sounds without rales or wheezing  Abdominal: Soft. Bowel sounds are normal. NT. No HSM  Musculoskeletal: Normal range of motion. Exhibits no edema Lymphadenopathy: Has no other cervical adenopathy.  Neurological: Pt is alert  and oriented to person, place, and time. Pt has normal reflexes. No cranial nerve deficit. Motor grossly intact, Gait intact Skin: Skin is warm and dry. No rash noted or new ulcerations Psychiatric:  Has nervous mild depressed mood and affect. Behavior is normal without agitation\ No other exam findings  Lab Results  Component Value Date   WBC 7.9 08/29/2016   HGB 13.5 08/29/2016   HCT 40.5 08/29/2016   PLT 277.0 08/29/2016   GLUCOSE 175 (H) 08/29/2016   CHOL 218 (H) 08/29/2016   TRIG 236.0 (H) 08/29/2016   HDL 46.90 08/29/2016   LDLDIRECT 152.0 08/29/2016   LDLCALC 166 (H) 04/12/2014   ALT 12 08/29/2016   AST 12 08/29/2016   NA 141 08/29/2016   K 4.1 08/29/2016   CL 108 08/29/2016   CREATININE 0.76 08/29/2016   BUN 16 08/29/2016   CO2 25 08/29/2016   TSH 1.94 08/29/2016   HGBA1C 6.8 (H) 08/29/2016   MICROALBUR 1.5 08/29/2016       Assessment & Plan:

## 2017-03-02 DIAGNOSIS — M19041 Primary osteoarthritis, right hand: Secondary | ICD-10-CM | POA: Diagnosis not present

## 2017-03-02 DIAGNOSIS — M6740 Ganglion, unspecified site: Secondary | ICD-10-CM | POA: Diagnosis not present

## 2017-03-10 ENCOUNTER — Other Ambulatory Visit (INDEPENDENT_AMBULATORY_CARE_PROVIDER_SITE_OTHER): Payer: Self-pay

## 2017-03-10 ENCOUNTER — Ambulatory Visit (INDEPENDENT_AMBULATORY_CARE_PROVIDER_SITE_OTHER): Payer: Medicare Other | Admitting: Internal Medicine

## 2017-03-10 ENCOUNTER — Encounter: Payer: Self-pay | Admitting: Internal Medicine

## 2017-03-10 VITALS — BP 132/86 | HR 99 | Temp 98.4°F | Ht 64.0 in | Wt 197.0 lb

## 2017-03-10 DIAGNOSIS — K219 Gastro-esophageal reflux disease without esophagitis: Secondary | ICD-10-CM | POA: Diagnosis not present

## 2017-03-10 DIAGNOSIS — R300 Dysuria: Secondary | ICD-10-CM

## 2017-03-10 DIAGNOSIS — N318 Other neuromuscular dysfunction of bladder: Secondary | ICD-10-CM

## 2017-03-10 DIAGNOSIS — J069 Acute upper respiratory infection, unspecified: Secondary | ICD-10-CM | POA: Diagnosis not present

## 2017-03-10 DIAGNOSIS — F418 Other specified anxiety disorders: Secondary | ICD-10-CM

## 2017-03-10 LAB — URINALYSIS, ROUTINE W REFLEX MICROSCOPIC
BILIRUBIN URINE: NEGATIVE
Hgb urine dipstick: NEGATIVE
KETONES UR: NEGATIVE
LEUKOCYTES UA: NEGATIVE
RBC / HPF: NONE SEEN (ref 0–?)
Specific Gravity, Urine: 1.01 (ref 1.000–1.030)
Total Protein, Urine: NEGATIVE
UROBILINOGEN UA: 0.2 (ref 0.0–1.0)
Urine Glucose: NEGATIVE
pH: 5.5 (ref 5.0–8.0)

## 2017-03-10 MED ORDER — FLUCONAZOLE 150 MG PO TABS: ORAL_TABLET | ORAL | 1 refills | Status: DC

## 2017-03-10 MED ORDER — AMOXICILLIN 500 MG PO CAPS: 1000.0000 mg | ORAL_CAPSULE | Freq: Two times a day (BID) | ORAL | 0 refills | Status: DC

## 2017-03-10 MED ORDER — PANTOPRAZOLE SODIUM 40 MG PO TBEC: 40.0000 mg | DELAYED_RELEASE_TABLET | Freq: Every day | ORAL | 3 refills | Status: DC

## 2017-03-10 NOTE — Assessment & Plan Note (Signed)
Mild to mod, for antireflux precautions, also protonix 40 qd,  to f/u any worsening symptoms or concerns

## 2017-03-10 NOTE — Assessment & Plan Note (Signed)
Mild to mod, possible Uti - for urine studies, for antibx course,  to f/u any worsening symptoms or concerns

## 2017-03-10 NOTE — Patient Instructions (Signed)
Please take all new medication as prescribed - the antibiotic, protonix for the stomach, and yeast medication if needed  Please continue all other medications as before, and refills have been done if requested.  Please have the pharmacy call with any other refills you may need.  Please keep your appointments with your specialists as you may have planned - Dr Ferdinand Lango tomorrow  Please go to the LAB in the Basement (turn left off the elevator) for the tests to be done today - just the urine testing today  You will be contacted by phone if any changes need to be made immediately.  Otherwise, you will receive a letter about your results with an explanation, but please check with MyChart first.  Please remember to sign up for MyChart if you have not done so, as this will be important to you in the future with finding out test results, communicating by private email, and scheduling acute appointments online when needed.

## 2017-03-10 NOTE — Assessment & Plan Note (Signed)
D/w pt, reassured, ok for f/u with counseling tomorrow as planned

## 2017-03-10 NOTE — Assessment & Plan Note (Signed)
Mild to mod, for antibx course,  to f/u any worsening symptoms or concerns 

## 2017-03-10 NOTE — Progress Notes (Signed)
Subjective:    Patient ID: Laura [name], female    DOB: 1946-01-04, 72 y.o.   MRN: [id number]  HPI   Here with 2-3 days acute onset fever, facial pain, pressure, headache, general weakness and malaise, and greenish d/c, with mild ST and cough, but pt denies chest pain, wheezing, increased sob or doe, orthopnea, PND, increased LE swelling, palpitations, dizziness or syncope. Also c/o 2-3 days onset dysuria but Denies urinary symptoms such as frequency, urgency, flank pain, hematuria or n/v, fever, chills.  Also c/o worsening mild to mod reflux, better with TUMS but just doesn't last.  Denies other abd pain, dysphagia, n/v, bowel change or blood.  Denies worsening depressive symptoms, suicidal ideation, or panic; but has ongoing anxiety, has appt tomorrow for counseling Past Medical History:  Diagnosis Date  . Anxiety   . Arthritis    back of neck, bones spurs on neck  . Cataract   . Cervical disc disease   . Diabetes mellitus   . Hyperlipidemia   . Hypertension   . Mucoid cyst of joint    right thumb  . Neuropathy   . Reflux   . Sleep apnea    wears CPAP nightly  . Vertigo    Past Surgical History:  Procedure Laterality Date  . BLADDER SURGERY    . BREAST EXCISIONAL BIOPSY Bilateral   . BREAST SURGERY    . CHOLECYSTECTOMY    . fibroids removed     breast (both breasts)  . MASS EXCISION Right 06/26/2016   Procedure: EXCISION MUCOID TUMOR RIGHT THUMB, IP RIGHT THUMB;  Surgeon: Daryll Brod, MD;  Location: [place];  Service: Orthopedics;  Laterality: Right;  . PARTIAL HYSTERECTOMY      reports that  has never smoked. she has never used smokeless tobacco. She reports that she does not drink alcohol or use drugs. family history includes Diabetes in her father, maternal aunt, and sister; Hypertension in her other. Allergies  Allergen Reactions  . Hydrocodone-Homatropine Other (See Comments)    Vertigo *pt strongly prefers to never take*  . Sulfa Antibiotics    Tongue swells, hives, itching  . Codeine     itch  . Crestor [Rosuvastatin Calcium] Other (See Comments)    Did something to memory   . Doxycycline   . Keflex [Cephalexin] Diarrhea and Nausea And Vomiting  . Lipitor [Atorvastatin Calcium] Other (See Comments)    Makes weak   . Naproxen Other (See Comments)    Stomach cramps  . Prednisone Other (See Comments)    Nervous *pt strongly prefers to never be given prednisone*    Current Outpatient Medications on File Prior to Visit  Medication Sig Dispense Refill  . acetaminophen (TYLENOL) 325 MG tablet Take 325 mg by mouth as needed for mild pain, moderate pain, fever or headache.     Marland Kitchen amLODipine (NORVASC) 5 MG tablet Take 1 tablet (5 mg total) by mouth daily. 90 tablet 3  . Blood Glucose Monitoring Suppl (ONE TOUCH ULTRA 2) w/Device KIT Use as directed 1 each 0  . glimepiride (AMARYL) 2 MG tablet TAKE ONE-HALF TABLET BY MOUTH ONCE DAILYBEFORE BREAKFAST 45 tablet 3  . loperamide (IMODIUM A-D) 2 MG tablet Take 2 mg by mouth daily as needed for diarrhea or loose stools.     Marland Kitchen LORazepam (ATIVAN) 0.5 MG tablet Take 1 tablet (0.5 mg total) by mouth 2 (two) times daily as needed for anxiety. 30 tablet 1  . ONE TOUCH ULTRA TEST  test strip USE TO CHECK BLOOD SUGARS DAILY 50 each 3  . ONETOUCH DELICA LANCETS 21Y MISC USE TO CHECK BLOOD SUGAR TWICE A DAY 100 each 3  . traMADol-acetaminophen (ULTRACET) 37.5-325 MG tablet TAKE 1 TABLET BY MOUTH EVERY 4 HOURS AS NEEDED FOR MODERATE PAIN 30 tablet 0   No current facility-administered medications on file prior to visit.    Review of Systems  Constitutional: Negative for other unusual diaphoresis or sweats HENT: Negative for ear discharge or swelling Eyes: Negative for other worsening visual disturbances Respiratory: Negative for stridor or other swelling  Gastrointestinal: Negative for worsening distension or other blood Genitourinary: Negative for retention or other urinary  change Musculoskeletal: Negative for other MSK pain or swelling Skin: Negative for color change or other new lesions Neurological: Negative for worsening tremors and other numbness  Psychiatric/Behavioral: Negative for worsening agitation or other fatigue All other system neg per pt    Objective:   Physical Exam BP 132/86   Pulse 99   Temp 98.4 F (36.9 C) (Oral)   Ht 5' 4"  (1.626 m)   Wt 197 lb (89.4 kg)   SpO2 98%   BMI 33.81 kg/m  VS noted, mild ill Constitutional: Pt appears in NAD HENT: Head: NCAT.  Right Ear: External ear normal.  Left Ear: External ear normal.  Bilat tm's with mild erythema.  Max sinus areas mild tender.  Pharynx with mild erythema, no exudate Eyes: . Pupils are equal, round, and reactive to light. Conjunctivae and EOM are normal Nose: without d/c or deformity Neck: Neck supple. Gross normal ROM Cardiovascular: Normal rate and regular rhythm.   Pulmonary/Chest: Effort normal and breath sounds without rales or wheezing.  Abd:  Soft, mild low mid abd tender, ND, + BS, no organomegaly Neurological: Pt is alert. At baseline orientation, motor grossly intact Skin: Skin is warm. No rashes, other new lesions, no LE edema Psychiatric: Pt behavior is normal without agitation but 2+ nervous and somewhat tearful No other exam findings    Assessment & Plan:

## 2017-03-10 NOTE — Assessment & Plan Note (Signed)
Has hx in past, for urine studies today, but consider restart OAB tx if negative

## 2017-03-11 ENCOUNTER — Ambulatory Visit (INDEPENDENT_AMBULATORY_CARE_PROVIDER_SITE_OTHER): Payer: Medicare Other | Admitting: Psychiatry

## 2017-03-11 DIAGNOSIS — F330 Major depressive disorder, recurrent, mild: Secondary | ICD-10-CM

## 2017-03-11 LAB — URINE CULTURE
MICRO NUMBER:: 90250206
Result:: NO GROWTH
SPECIMEN QUALITY:: ADEQUATE

## 2017-04-15 DIAGNOSIS — H43811 Vitreous degeneration, right eye: Secondary | ICD-10-CM | POA: Diagnosis not present

## 2017-04-15 DIAGNOSIS — H04121 Dry eye syndrome of right lacrimal gland: Secondary | ICD-10-CM | POA: Diagnosis not present

## 2017-04-15 DIAGNOSIS — H25811 Combined forms of age-related cataract, right eye: Secondary | ICD-10-CM | POA: Diagnosis not present

## 2017-04-15 DIAGNOSIS — H5310 Unspecified subjective visual disturbances: Secondary | ICD-10-CM | POA: Diagnosis not present

## 2017-05-22 ENCOUNTER — Other Ambulatory Visit: Payer: Self-pay | Admitting: Internal Medicine

## 2017-06-01 DIAGNOSIS — M19041 Primary osteoarthritis, right hand: Secondary | ICD-10-CM | POA: Diagnosis not present

## 2017-06-05 ENCOUNTER — Encounter: Payer: Self-pay | Admitting: Gastroenterology

## 2017-06-12 ENCOUNTER — Ambulatory Visit (INDEPENDENT_AMBULATORY_CARE_PROVIDER_SITE_OTHER): Payer: Medicare Other | Admitting: Nurse Practitioner

## 2017-06-12 ENCOUNTER — Encounter: Payer: Self-pay | Admitting: Nurse Practitioner

## 2017-06-12 VITALS — BP 148/84 | HR 88 | Ht 64.0 in | Wt 200.9 lb

## 2017-06-12 DIAGNOSIS — K5900 Constipation, unspecified: Secondary | ICD-10-CM

## 2017-06-12 NOTE — Patient Instructions (Addendum)
If you are age 73 or older, your body mass index should be between 23-30. Your Body mass index is 34.48 kg/m. If this is out of the aforementioned range listed, please consider follow up with your Primary Care Provider.  If you are age 65 or younger, your body mass index should be between 19-25. Your Body mass index is 34.48 kg/m. If this is out of the aformentioned range listed, please consider follow up with your Primary Care Provider.   Magnesium Citrate 1/2 tonight followed by at least 2-3 glasses of water.  Repeat process in the a.m.  Start Miralax 1 capful daily starting on Sunday.  STOP Imodium.  Use Glycerin suppositories as needed.  Call with an update in two weeks.   Thank you for choosing me and [facility].   Tye Savoy, NP

## 2017-06-12 NOTE — Progress Notes (Signed)
I agree with the above note, plan 

## 2017-06-12 NOTE — Progress Notes (Signed)
IMPRESSION and PLAN:    Constipation / bloating.  Problem started after 2 rounds of antibiotics February to March for respiratory symptoms.  Her stools have become very hard requiring manual maneuvers to have a bowel movement.   -Like she stays hydrated and consumes adequate amount of fiber.  I think her bloating is from constipation but to be on the safe side I am not going to start supplemental fiber at this point -Stop Imodium.  She is taking it a couple of times since March for loose stool which often leads to episodes of incontinence.  I think she may be having overflow which I did explain so she would understand the rationale behind stopping Imodium for now -Purge bowels with Mg Citrate.  One half bottle as directed this evening, remaining half tomorrow.  She will need to follow each dose with at least 3 glasses of water -On Sunday patient will start 1 capful of MiraLAX daily. -Hopefully her bowels were returned to baseline soon and she will not need MiraLAX going forward.  I asked her to call us with a condition update in 2 weeks or sooner if need be.  -If bloating persists following resolution of constipation then we will work to address that.  With 2 rounds of antibiotics she is likely had disruption of her intestinal microbiome.       HPI:    Chief Complaint: constipation and bloating   Patient is a 72 year old female known to Dr. Ardis Hughs.  She has an history of adenomatous colon polyps.  Surveillance colonoscopy September 2018 was completely normal  Patient took 2 rounds of antibiotics for respiratory symptoms between February and March.  Since then she has struggled with constipation.  She does get the urge to defecate but sits on the toilet without any results.  She may do this several times before resulting to manual maneuvers to help extract stool.  She has taken Imodium a couple times since the constipation started .  He takes it to prevent urgent diarrhea with incontinence  if out in public.  Other than the antibiotics patient has not had any changes to her regular medications.  She stays well-hydrated, eats fruits and vegetables.  Constipation has been problems for her periodically but right now she cannot get it under control.  She tried some probiotic prunes over the weekend and had some results but does not want to take those on a regular basis.  Her last bowel movement was yesterday.  She has had problems with bloating since being constipated but no severe abdominal pain, nausea or vomiting   Review of systems:   No chest pain, no SOB, no fevers, no urinary sx     Past Medical History:  Diagnosis Date  . Anxiety   . Arthritis    back of neck, bones spurs on neck  . Cataract   . Cervical disc disease   . Diabetes mellitus   . Hyperlipidemia   . Hypertension   . Mucoid cyst of joint    right thumb  . Neuropathy   . Reflux   . Sleep apnea    wears CPAP nightly  . Vertigo     Patient's surgical history, family medical history, social history, medications and allergies were all reviewed in Epic    Physical Exam:     BP (!) 148/84   Pulse 88   Ht 5\' 4"  (1.626 m)   Wt 200 lb 14.4 oz (91.1 kg)   SpO2  98%   BMI 34.48 kg/m    GENERAL:  Pleasant female in NAD PSYCH: : Cooperative, normal affect EENT:  conjunctiva pink, mucous membranes moist, neck supple without masses CARDIAC:  RRR,  no peripheral edema PULM: Normal respiratory effort, lungs CTA bilaterally, no wheezing ABDOMEN:  Nondistended, soft, nontender. No obvious masses, no hepatomegaly,  normal bowel sounds SKIN:  turgor, no lesions seen Musculoskeletal:  Normal muscle tone, normal strength NEURO: Alert and oriented x 3, no focal neurologic deficits   Laura [name] , NP 06/12/2017, 1:37 PM

## 2017-06-18 ENCOUNTER — Encounter: Payer: Self-pay | Admitting: Internal Medicine

## 2017-06-26 ENCOUNTER — Other Ambulatory Visit (INDEPENDENT_AMBULATORY_CARE_PROVIDER_SITE_OTHER): Payer: Self-pay | Admitting: Specialist

## 2017-06-26 DIAGNOSIS — M542 Cervicalgia: Secondary | ICD-10-CM

## 2017-06-26 DIAGNOSIS — M7502 Adhesive capsulitis of left shoulder: Secondary | ICD-10-CM

## 2017-06-26 DIAGNOSIS — M4712 Other spondylosis with myelopathy, cervical region: Secondary | ICD-10-CM

## 2017-06-26 NOTE — Telephone Encounter (Signed)
ultracet refill request 

## 2017-06-26 NOTE — Telephone Encounter (Signed)
I called rx into pharm   

## 2017-08-13 ENCOUNTER — Encounter: Payer: Self-pay | Admitting: Internal Medicine

## 2017-08-13 MED ORDER — NYSTATIN 100000 UNIT/GM EX POWD: CUTANEOUS | 1 refills | Status: DC

## 2017-08-25 ENCOUNTER — Ambulatory Visit (INDEPENDENT_AMBULATORY_CARE_PROVIDER_SITE_OTHER): Payer: Medicare Other | Admitting: Internal Medicine

## 2017-08-25 ENCOUNTER — Ambulatory Visit: Payer: Self-pay | Admitting: Internal Medicine

## 2017-08-25 ENCOUNTER — Encounter: Payer: Self-pay | Admitting: Internal Medicine

## 2017-08-25 ENCOUNTER — Other Ambulatory Visit (INDEPENDENT_AMBULATORY_CARE_PROVIDER_SITE_OTHER): Payer: Medicare Other

## 2017-08-25 ENCOUNTER — Telehealth: Payer: Self-pay | Admitting: Internal Medicine

## 2017-08-25 ENCOUNTER — Other Ambulatory Visit: Payer: Self-pay | Admitting: Internal Medicine

## 2017-08-25 VITALS — BP 148/88 | HR 83 | Temp 98.4°F | Ht 64.0 in | Wt 203.0 lb

## 2017-08-25 DIAGNOSIS — E1140 Type 2 diabetes mellitus with diabetic neuropathy, unspecified: Secondary | ICD-10-CM | POA: Diagnosis not present

## 2017-08-25 DIAGNOSIS — Z1159 Encounter for screening for other viral diseases: Secondary | ICD-10-CM

## 2017-08-25 DIAGNOSIS — Z0000 Encounter for general adult medical examination without abnormal findings: Secondary | ICD-10-CM

## 2017-08-25 LAB — URINALYSIS, ROUTINE W REFLEX MICROSCOPIC
Bilirubin Urine: NEGATIVE
HGB URINE DIPSTICK: NEGATIVE
Ketones, ur: NEGATIVE
Leukocytes, UA: NEGATIVE
NITRITE: NEGATIVE
RBC / HPF: NONE SEEN (ref 0–?)
SPECIFIC GRAVITY, URINE: 1.02 (ref 1.000–1.030)
Total Protein, Urine: NEGATIVE
Urine Glucose: 500 — AB
Urobilinogen, UA: 0.2 (ref 0.0–1.0)
pH: 5.5 (ref 5.0–8.0)

## 2017-08-25 LAB — LIPID PANEL
CHOLESTEROL: 243 mg/dL — AB (ref 0–200)
HDL: 55.3 mg/dL (ref >39.00)
NonHDL: 187.3
Total CHOL/HDL Ratio: 4
Triglycerides: 234 mg/dL — ABNORMAL HIGH (ref 0.0–149.0)
VLDL: 46.8 mg/dL — AB (ref 0.0–40.0)

## 2017-08-25 LAB — CBC WITH DIFFERENTIAL/PLATELET
BASOS ABS: 0 10*3/uL (ref 0.0–0.1)
Basophils Relative: 0.5 % (ref 0.0–3.0)
EOS PCT: 1.7 % (ref 0.0–5.0)
Eosinophils Absolute: 0.1 10*3/uL (ref 0.0–0.7)
HEMATOCRIT: 39.1 % (ref 36.0–46.0)
HEMOGLOBIN: 13.2 g/dL (ref 12.0–15.0)
LYMPHS PCT: 34.3 % (ref 12.0–46.0)
Lymphs Abs: 2.7 10*3/uL (ref 0.7–4.0)
MCHC: 33.7 g/dL (ref 30.0–36.0)
MCV: 86.9 fl (ref 78.0–100.0)
Monocytes Absolute: 0.5 10*3/uL (ref 0.1–1.0)
Monocytes Relative: 6 % (ref 3.0–12.0)
NEUTROS PCT: 57.5 % (ref 43.0–77.0)
Neutro Abs: 4.6 10*3/uL (ref 1.4–7.7)
Platelets: 271 10*3/uL (ref 150.0–400.0)
RBC: 4.51 Mil/uL (ref 3.87–5.11)
RDW: 13.8 % (ref 11.5–15.5)
WBC: 7.9 10*3/uL (ref 4.0–10.5)

## 2017-08-25 LAB — BASIC METABOLIC PANEL
BUN: 11 mg/dL (ref 6–23)
CALCIUM: 10 mg/dL (ref 8.4–10.5)
CO2: 29 mEq/L (ref 19–32)
CREATININE: 0.76 mg/dL (ref 0.40–1.20)
Chloride: 107 mEq/L (ref 96–112)
GFR: 96 mL/min (ref >60.00)
Glucose, Bld: 134 mg/dL — ABNORMAL HIGH (ref 70–99)
Potassium: 3.9 mEq/L (ref 3.5–5.1)
Sodium: 140 mEq/L (ref 135–145)

## 2017-08-25 LAB — LDL CHOLESTEROL, DIRECT: LDL DIRECT: 167 mg/dL

## 2017-08-25 LAB — HEPATIC FUNCTION PANEL
ALBUMIN: 4.5 g/dL (ref 3.5–5.2)
ALK PHOS: 71 U/L (ref 39–117)
ALT: 13 U/L (ref 0–35)
AST: 15 U/L (ref 0–37)
Bilirubin, Direct: 0 mg/dL (ref 0.0–0.3)
TOTAL PROTEIN: 7.6 g/dL (ref 6.0–8.3)
Total Bilirubin: 0.3 mg/dL (ref 0.2–1.2)

## 2017-08-25 LAB — MICROALBUMIN / CREATININE URINE RATIO
Creatinine,U: 49.1 mg/dL
MICROALB UR: 1 mg/dL (ref 0.0–1.9)
Microalb Creat Ratio: 2 mg/g (ref 0.0–30.0)

## 2017-08-25 LAB — HEMOGLOBIN A1C: Hgb A1c MFr Bld: 7.4 % — ABNORMAL HIGH (ref 4.6–6.5)

## 2017-08-25 LAB — TSH: TSH: 2.17 u[IU]/mL (ref 0.35–4.50)

## 2017-08-25 MED ORDER — NYSTATIN 100000 UNIT/GM EX CREA: 1.0000 "application " | TOPICAL_CREAM | Freq: Two times a day (BID) | CUTANEOUS | 1 refills | Status: DC

## 2017-08-25 MED ORDER — LORAZEPAM 0.5 MG PO TABS: 0.5000 mg | ORAL_TABLET | Freq: Two times a day (BID) | ORAL | 1 refills | Status: DC | PRN

## 2017-08-25 NOTE — Assessment & Plan Note (Signed)

## 2017-08-25 NOTE — Addendum Note (Signed)
Addended by: Trenda Moots on: 9/79/4801 11:54 AM   Modules accepted: Orders

## 2017-08-25 NOTE — Telephone Encounter (Signed)
Done erx 

## 2017-08-25 NOTE — Telephone Encounter (Signed)
Patient requesting refill on lorazepam to be sent to [facility].

## 2017-08-25 NOTE — Progress Notes (Signed)
Subjective:    Patient ID: Laura [name], female    DOB: [birth year], 72 y.o.   MRN: [id number]  HPI  Here for wellness and f/u;  Overall doing ok;  Pt denies Chest pain, worsening SOB, DOE, wheezing, orthopnea, PND, worsening LE edema, palpitations, dizziness or syncope.  Pt denies neurological change such as new headache, facial or extremity weakness.  Pt denies polydipsia, polyuria, or low sugar symptoms. Pt states overall good compliance with treatment and medications, good tolerability, and has been trying to follow appropriate diet.  Pt denies worsening depressive symptoms, suicidal ideation or panic. No fever, night sweats, wt loss, loss of appetite, or other constitutional symptoms.  Pt states good ability with ADL's, has low fall risk, home safety reviewed and adequate, no other significant changes in hearing or vision, and only occasionally active with exercise.  Has been statin intolerant.   Past Medical History:  Diagnosis Date  . Anxiety   . Arthritis    back of neck, bones spurs on neck  . Cataract   . Cervical disc disease   . Diabetes mellitus   . Hyperlipidemia   . Hypertension   . Mucoid cyst of joint    right thumb  . Neuropathy   . Reflux   . Sleep apnea    wears CPAP nightly  . Vertigo    Past Surgical History:  Procedure Laterality Date  . BLADDER SURGERY    . BREAST EXCISIONAL BIOPSY Bilateral   . BREAST SURGERY    . CHOLECYSTECTOMY    . fibroids removed     breast (both breasts)  . MASS EXCISION Right 06/26/2016   Procedure: EXCISION MUCOID TUMOR RIGHT THUMB, IP RIGHT THUMB;  Surgeon: Daryll Brod, MD;  Location: [facility];  Service: Orthopedics;  Laterality: Right;  . PARTIAL HYSTERECTOMY      reports that she has never smoked. She has never used smokeless tobacco. She reports that she does not drink alcohol or use drugs. family history includes Diabetes in her father, maternal aunt, and sister; Hypertension in her other. Allergies    Allergen Reactions  . Hydrocodone-Homatropine Other (See Comments)    Vertigo *pt strongly prefers to never take*  . Sulfa Antibiotics     Tongue swells, hives, itching  . Codeine     itch  . Crestor [Rosuvastatin Calcium] Other (See Comments)    Did something to memory   . Doxycycline   . Keflex [Cephalexin] Diarrhea and Nausea And Vomiting  . Lipitor [Atorvastatin Calcium] Other (See Comments)    Makes weak   . Naproxen Other (See Comments)    Stomach cramps  . Prednisone Other (See Comments)    Nervous *pt strongly prefers to never be given prednisone*    Current Outpatient Medications on File Prior to Visit  Medication Sig Dispense Refill  . acetaminophen (TYLENOL) 325 MG tablet Take 325 mg by mouth as needed for mild pain, moderate pain, fever or headache.     Marland Kitchen amLODipine (NORVASC) 5 MG tablet Take 1 tablet (5 mg total) by mouth daily. 90 tablet 3  . Blood Glucose Monitoring Suppl (ONE TOUCH ULTRA 2) w/Device KIT Use as directed 1 each 0  . glimepiride (AMARYL) 2 MG tablet TAKE ONE-HALF TABLET BY MOUTH ONCE DAILYBEFORE BREAKFAST 45 tablet 3  . glucose blood (ONE TOUCH ULTRA TEST) test strip Use to check blood sugars twice a day 100 each 3  . Lancets (ONETOUCH ULTRASOFT) lancets USE TO CHECK BLOOD SUGAR TWICE  A DAY 100 each 3  . pantoprazole (PROTONIX) 40 MG tablet Take 1 tablet (40 mg total) by mouth daily. (Patient taking differently: Take 40 mg by mouth daily. As needed) 90 tablet 3   No current facility-administered medications on file prior to visit.    Review of Systems Constitutional: Negative for other unusual diaphoresis, sweats, appetite or weight changes HENT: Negative for other worsening hearing loss, ear pain, facial swelling, mouth sores or neck stiffness.   Eyes: Negative for other worsening pain, redness or other visual disturbance.  Respiratory: Negative for other stridor or swelling Cardiovascular: Negative for other palpitations or other chest pain   Gastrointestinal: Negative for worsening diarrhea or loose stools, blood in stool, distention or other pain Genitourinary: Negative for hematuria, flank pain or other change in urine volume.  Musculoskeletal: Negative for myalgias or other joint swelling.  Skin: Negative for other color change, or other wound or worsening drainage.  Neurological: Negative for other syncope or numbness. Hematological: Negative for other adenopathy or swelling Psychiatric/Behavioral: Negative for hallucinations, other worsening agitation, SI, self-injury, or new decreased concentration All other system neg per pt    Objective:   Physical Exam BP (!) 148/88   Pulse 83   Temp 98.4 F (36.9 C) (Oral)   Ht 5' 4"  (1.626 m)   Wt 203 lb (92.1 kg)   SpO2 98%   BMI 34.84 kg/m  VS noted,  Constitutional: Pt is oriented to person, place, and time. Appears well-developed and well-nourished, in no significant distress and comfortable Head: Normocephalic and atraumatic  Eyes: Conjunctivae and EOM are normal. Pupils are equal, round, and reactive to light Right Ear: External ear normal without discharge Left Ear: External ear normal without discharge Nose: Nose without discharge or deformity Mouth/Throat: Oropharynx is without other ulcerations and moist  Neck: Normal range of motion. Neck supple. No JVD present. No tracheal deviation present or significant neck LA or mass Cardiovascular: Normal rate, regular rhythm, normal heart sounds and intact distal pulses.   Pulmonary/Chest: WOB normal and breath sounds without rales or wheezing  Abdominal: Soft. Bowel sounds are normal. NT. No HSM  Musculoskeletal: Normal range of motion. Exhibits no edema Lymphadenopathy: Has no other cervical adenopathy.  Neurological: Pt is alert and oriented to person, place, and time. Pt has normal reflexes. No cranial nerve deficit. Motor grossly intact, Gait intact Skin: Skin is warm and dry. No rash noted or new  ulcerations Psychiatric:  Has normal mood and affect. Behavior is normal without agitation No other exam findings    Assessment & Plan:

## 2017-08-25 NOTE — Assessment & Plan Note (Signed)
stable overall by history and exam, recent data reviewed with pt, and pt to continue medical treatment as before,  to f/u any worsening symptoms or concerns,. For a1c with labs 

## 2017-08-25 NOTE — Patient Instructions (Signed)

## 2017-08-25 NOTE — Addendum Note (Signed)
Addended by: Biagio Borg on: 08/25/2017 12:29 PM   Modules accepted: Orders

## 2017-08-26 LAB — HEPATITIS C ANTIBODY
HEP C AB: NONREACTIVE
SIGNAL TO CUT-OFF: 0.04 (ref <1.00)

## 2017-08-27 ENCOUNTER — Other Ambulatory Visit: Payer: Self-pay | Admitting: Internal Medicine

## 2017-10-13 ENCOUNTER — Encounter: Payer: Self-pay | Admitting: Internal Medicine

## 2017-11-05 DIAGNOSIS — G4733 Obstructive sleep apnea (adult) (pediatric): Secondary | ICD-10-CM | POA: Diagnosis not present

## 2017-11-10 ENCOUNTER — Ambulatory Visit: Payer: Self-pay | Admitting: *Deleted

## 2017-11-10 NOTE — Telephone Encounter (Signed)
Pt reports increased fatigue, generalized weakness and poor endurance, onset "At least a month ago."  Intermittent, "Some days I'm back to my normal self." Also reports decreased appetite, not sleeping well at night. States is staying hydrated.  Denies any SOB, CP, no recent illness, no cough or congestion. States has been taking Advil for neck pain , "Nothing new;" H/O cervical DDD. No new medications. Pt requests to see only Dr. Jenny Reichmann. Spoke with '[place]' in attempts to schedule appt; pt would like to be seen 11/19/17 at 1100. '[place]' to secure appt. Care advise given per protocol.  Reason for Disposition . [1] Fatigue (i.e., tires easily, decreased energy) AND [2] persists > 1 week  Answer Assessment - Initial Assessment Questions 1. DESCRIPTION: "Describe how you are feeling."    No energy, weak, poor endurance 2. SEVERITY: "How bad is it?"  "Can you stand and walk?"   - MILD - Feels weak or tired, but does not interfere with work, school or normal activities   - [place] to stand and walk; weakness interferes with work, school, or normal activities   - SEVERE - Unable to stand or walk    moderate 3. ONSET:  "When did the weakness begin?"     At least a month ago 4. CAUSE: "What do you think is causing the weakness?"    Unsure 5. MEDICINES: "Have you recently started a new medicine or had a change in the amount of a medicine?"    advil for neck pain but "Not new." 6. OTHER SYMPTOMS: "Do you have any other symptoms?" (e.g., chest pain, fever, cough, SOB, vomiting, diarrhea, bleeding, other areas of pain)    Generalized weakness, no energy, intermittent  Protocols used: WEAKNESS (GENERALIZED) AND FATIGUE-A-AH

## 2017-11-10 NOTE — Telephone Encounter (Signed)
Please schedule

## 2017-11-19 ENCOUNTER — Encounter: Payer: Self-pay | Admitting: Internal Medicine

## 2017-11-19 ENCOUNTER — Ambulatory Visit (INDEPENDENT_AMBULATORY_CARE_PROVIDER_SITE_OTHER): Payer: Medicare Other | Admitting: Internal Medicine

## 2017-11-19 VITALS — BP 138/84 | HR 82 | Temp 98.5°F | Ht 64.0 in | Wt 201.0 lb

## 2017-11-19 DIAGNOSIS — K219 Gastro-esophageal reflux disease without esophagitis: Secondary | ICD-10-CM | POA: Diagnosis not present

## 2017-11-19 DIAGNOSIS — K589 Irritable bowel syndrome without diarrhea: Secondary | ICD-10-CM | POA: Diagnosis not present

## 2017-11-19 DIAGNOSIS — Z23 Encounter for immunization: Secondary | ICD-10-CM | POA: Diagnosis not present

## 2017-11-19 DIAGNOSIS — K5900 Constipation, unspecified: Secondary | ICD-10-CM | POA: Diagnosis not present

## 2017-11-19 DIAGNOSIS — F418 Other specified anxiety disorders: Secondary | ICD-10-CM

## 2017-11-19 MED ORDER — PANTOPRAZOLE SODIUM 40 MG PO TBEC
40.0000 mg | DELAYED_RELEASE_TABLET | Freq: Every day | ORAL | 3 refills | Status: DC
Start: 2017-11-19 — End: 2018-03-09

## 2017-11-19 MED ORDER — LACTULOSE 10 GM/15ML PO SOLN: 30.0000 g | Freq: Every day | ORAL | 3 refills | Status: DC | PRN

## 2017-11-19 NOTE — Patient Instructions (Signed)
Please take all new medication as prescribed - the lactulose for constipation, and protonix for stomach acid  Please continue all other medications as before, and refills have been done if requested.  Please have the pharmacy call with any other refills you may need.  Please keep your appointments with your specialists as you may have planned  You will be contacted regarding the referral for: Gastroenterology

## 2017-11-19 NOTE — Assessment & Plan Note (Signed)
Mild to mod, for protonix 40 qd,  to f/u any worsening symptoms or concerns 

## 2017-11-19 NOTE — Progress Notes (Signed)
Subjective:    Patient ID: Laura [name], female    DOB: 1945-07-09, 72 y.o.   MRN: [id number]  HPI  Pt denies chest pain, increased sob or doe, wheezing, orthopnea, PND, increased LE swelling, palpitations, dizziness or syncope.  Pt denies new neurological symptoms such as new headache, or facial or extremity weakness or numbness   Pt denies polydipsia, polyuria.  Has some intermittent change in urinary flow in that some flows are stronger than others, and varialbe amounts.  Denies urinary symptoms such as dysuria, frequency, urgency, flank pain, hematuria or n/v, fever, chills, no leakage or incontinence; used to wear a pad but not recently.  Has had worsening reflux assoc with upper abd pain and bloating and constipation, but no dysphagia, n/v, or blood.  C/o low energy, was more fatigued last wk but that part better now.   Has ongoing constipation and intermittent loose stools, maybe some better after stopped her goody powders.  Thi was main reason called for appt last wk.   Pain is an issue with right jaw spasm where she was biting her tongue most of the night one night last wk.  Also has some tendonitis of both distal lower legs with pain to stand and walk but better with more walking.   Past Medical History:  Diagnosis Date  . Anxiety   . Arthritis    back of neck, bones spurs on neck  . Cataract   . Cervical disc disease   . Diabetes mellitus   . Hyperlipidemia   . Hypertension   . Mucoid cyst of joint    right thumb  . Neuropathy   . Reflux   . Sleep apnea    wears CPAP nightly  . Vertigo    Past Surgical History:  Procedure Laterality Date  . BLADDER SURGERY    . BREAST EXCISIONAL BIOPSY Bilateral   . BREAST SURGERY    . CHOLECYSTECTOMY    . fibroids removed     breast (both breasts)  . MASS EXCISION Right 06/26/2016   Procedure: EXCISION MUCOID TUMOR RIGHT THUMB, IP RIGHT THUMB;  Surgeon: Daryll Brod, MD;  Location: [facility];  Service: Orthopedics;   Laterality: Right;  . PARTIAL HYSTERECTOMY      reports that she has never smoked. She has never used smokeless tobacco. She reports that she does not drink alcohol or use drugs. family history includes Diabetes in her father, maternal aunt, and sister; Hypertension in her other. Allergies  Allergen Reactions  . Hydrocodone-Homatropine Other (See Comments)    Vertigo *pt strongly prefers to never take*  . Sulfa Antibiotics     Tongue swells, hives, itching  . Codeine     itch  . Crestor [Rosuvastatin Calcium] Other (See Comments)    Did something to memory   . Doxycycline   . Keflex [Cephalexin] Diarrhea and Nausea And Vomiting  . Lipitor [Atorvastatin Calcium] Other (See Comments)    Makes weak   . Naproxen Other (See Comments)    Stomach cramps  . Prednisone Other (See Comments)    Nervous *pt strongly prefers to never be given prednisone*    Current Outpatient Medications on File Prior to Visit  Medication Sig Dispense Refill  . acetaminophen (TYLENOL) 325 MG tablet Take 325 mg by mouth as needed for mild pain, moderate pain, fever or headache.     Marland Kitchen amLODipine (NORVASC) 5 MG tablet TAKE ONE (1) TABLET BY MOUTH EVERY DAY 90 tablet 3  . Blood  Glucose Monitoring Suppl (ONE TOUCH ULTRA 2) w/Device KIT Use as directed 1 each 0  . glimepiride (AMARYL) 2 MG tablet TAKE ONE-HALF TABLET BY MOUTH ONCE DAILYBEFORE BREAKFAST 45 tablet 3  . glucose blood (ONE TOUCH ULTRA TEST) test strip Use to check blood sugars twice a day 100 each 3  . Lancets (ONETOUCH ULTRASOFT) lancets USE TO CHECK BLOOD SUGAR TWICE A DAY 100 each 3  . LORazepam (ATIVAN) 0.5 MG tablet Take 1 tablet (0.5 mg total) by mouth 2 (two) times daily as needed for anxiety. 60 tablet 1  . nystatin cream (MYCOSTATIN) Apply 1 application topically 2 (two) times daily. 30 g 1   No current facility-administered medications on file prior to visit.    Review of Systems  Constitutional: Negative for other unusual diaphoresis or  sweats HENT: Negative for ear discharge or swelling Eyes: Negative for other worsening visual disturbances Respiratory: Negative for stridor or other swelling  Gastrointestinal: Negative for worsening distension or other blood Genitourinary: Negative for retention or other urinary change Musculoskeletal: Negative for other MSK pain or swelling Skin: Negative for color change or other new lesions Neurological: Negative for worsening tremors and other numbness  Psychiatric/Behavioral: Negative for worsening agitation or other fatigue All other system neg per pt    Objective:   Physical Exam BP 138/84   Pulse 82   Temp 98.5 F (36.9 C) (Oral)   Ht 5' 4"  (1.626 m)   Wt 201 lb (91.2 kg)   SpO2 97%   BMI 34.50 kg/m  VS noted,  Constitutional: Pt appears in NAD HENT: Head: NCAT.  Right Ear: External ear normal.  Left Ear: External ear normal.  Eyes: . Pupils are equal, round, and reactive to light. Conjunctivae and EOM are normal Nose: without d/c or deformity Neck: Neck supple. Gross normal ROM Cardiovascular: Normal rate and regular rhythm.   Pulmonary/Chest: Effort normal and breath sounds without rales or wheezing.  Abd:  Soft, NT, ND, + BS, no organomegaly Neurological: Pt is alert. At baseline orientation, motor grossly intact Skin: Skin is warm. No rashes, other new lesions, no LE edema Psychiatric: Pt behavior is normal without agitation but 2+ nervous No other exam findings Lab Results  Component Value Date   WBC 7.9 08/25/2017   HGB 13.2 08/25/2017   HCT 39.1 08/25/2017   PLT 271.0 08/25/2017   GLUCOSE 134 (H) 08/25/2017   CHOL 243 (H) 08/25/2017   TRIG 234.0 (H) 08/25/2017   HDL 55.30 08/25/2017   LDLDIRECT 167.0 08/25/2017   LDLCALC 166 (H) 04/12/2014   ALT 13 08/25/2017   AST 15 08/25/2017   NA 140 08/25/2017   K 3.9 08/25/2017   CL 107 08/25/2017   CREATININE 0.76 08/25/2017   BUN 11 08/25/2017   CO2 29 08/25/2017   TSH 2.17 08/25/2017   HGBA1C 7.4  (H) 08/25/2017   MICROALBUR 1.0 08/25/2017       Assessment & Plan:

## 2017-11-19 NOTE — Assessment & Plan Note (Signed)
Worsening recently, pt asking for GI referral

## 2017-11-19 NOTE — Assessment & Plan Note (Signed)
Declines further tx for now or referral for counseling

## 2017-12-02 ENCOUNTER — Encounter: Payer: Self-pay | Admitting: Family

## 2017-12-02 ENCOUNTER — Ambulatory Visit (INDEPENDENT_AMBULATORY_CARE_PROVIDER_SITE_OTHER): Payer: Medicare Other | Admitting: Family

## 2017-12-02 ENCOUNTER — Other Ambulatory Visit: Payer: Medicare Other

## 2017-12-02 VITALS — BP 140/70 | HR 81 | Temp 98.7°F | Ht 64.0 in | Wt 201.0 lb

## 2017-12-02 DIAGNOSIS — R3911 Hesitancy of micturition: Secondary | ICD-10-CM

## 2017-12-02 DIAGNOSIS — J019 Acute sinusitis, unspecified: Secondary | ICD-10-CM

## 2017-12-02 DIAGNOSIS — J209 Acute bronchitis, unspecified: Secondary | ICD-10-CM | POA: Diagnosis not present

## 2017-12-02 LAB — POC URINALSYSI DIPSTICK (AUTOMATED)
Bilirubin, UA: NEGATIVE
GLUCOSE UA: NEGATIVE
Ketones, UA: NEGATIVE
LEUKOCYTES UA: NEGATIVE
NITRITE UA: NEGATIVE
Protein, UA: NEGATIVE
RBC UA: NEGATIVE
Spec Grav, UA: 1.025 (ref 1.010–1.025)
UROBILINOGEN UA: 0.2 U/dL
pH, UA: 5.5 (ref 5.0–8.0)

## 2017-12-02 MED ORDER — BENZONATATE 100 MG PO CAPS: 100.0000 mg | ORAL_CAPSULE | Freq: Three times a day (TID) | ORAL | 0 refills | Status: DC | PRN

## 2017-12-02 MED ORDER — AMOXICILLIN-POT CLAVULANATE 875-125 MG PO TABS: 1.0000 | ORAL_TABLET | Freq: Two times a day (BID) | ORAL | 0 refills | Status: DC

## 2017-12-02 MED ORDER — FLUCONAZOLE 150 MG PO TABS: 150.0000 mg | ORAL_TABLET | Freq: Once | ORAL | 0 refills | Status: AC

## 2017-12-02 MED ORDER — FLUTICASONE PROPIONATE 50 MCG/ACT NA SUSP: 2.0000 | Freq: Every day | NASAL | 6 refills | Status: DC

## 2017-12-02 NOTE — Progress Notes (Signed)
Laura [name] is a 72 y.o. female with the following history as recorded in [employer]:  Patient Active Problem List   Diagnosis Date Noted  . Cough 02/23/2017  . Allergic conjunctivitis 12/12/2016  . Left hand pain 05/26/2016  . Mucoid cyst, joint 05/26/2016  . Pain in finger of right hand 05/26/2016  . Primary osteoarthritis of both first carpometacarpal joints 05/26/2016  . Skin avulsion 05/26/2016  . Adhesive capsulitis of left shoulder 03/20/2016  . Fatigue 12/16/2015  . Acute bronchitis 10/31/2015  . Urinary frequency 03/09/2015  . Hearing loss, sensorineural, asymmetrical 11/09/2014  . Vestibular hypofunction, left 11/09/2014  . Morbid obesity ([place]) 07/25/2014  . Orthostatic hypotension 07/07/2014  . Upper airway cough syndrome 06/26/2014  . Diarrhea 05/02/2014  . Dizziness 04/12/2014  . Neuropathy   . Rash and nonspecific skin eruption 08/30/2013  . Paresthesia of both feet 08/30/2013  . Blurred vision 02/15/2013  . Eye pain 02/15/2013  . Sinusitis, chronic 10/07/2012  . Left shoulder pain 04/15/2012  . Preventative health care 10/22/2011  . Cervical disc disease 10/22/2011  . Bilateral hand pain 08/05/2011  . SNHL (sensorineural hearing loss) 08/04/2011  . Fibrocystic breast disease 07/02/2011  . Hypertension 07/01/2011  . GERD (gastroesophageal reflux disease) 06/29/2011  . Bloating 06/29/2011  . Back pain 03/13/2011  . Vertigo 01/04/2011  . Nystagmus 01/04/2011  . Insomnia 10/21/2010  . CONSTIPATION 08/17/2009  . VITAMIN D DEFICIENCY 05/15/2009  . DYSPNEA 03/13/2009  . Depression with anxiety 01/16/2009  . Irritable bowel syndrome 01/16/2009  . Primary osteoarthritis of both hands 12/15/2008  . Diabetes ([place]) 11/03/2008  . Hyperlipidemia 11/03/2008  . Anxiety state 11/03/2008  . OVERACTIVE BLADDER 11/03/2008  . OSA (obstructive sleep apnea) 11/03/2008  . MURMUR 11/03/2008    Current Outpatient Medications  Medication Sig Dispense Refill  .  acetaminophen (TYLENOL) 325 MG tablet Take 325 mg by mouth as needed for mild pain, moderate pain, fever or headache.     Marland Kitchen amLODipine (NORVASC) 5 MG tablet TAKE ONE (1) TABLET BY MOUTH EVERY DAY 90 tablet 3  . Blood Glucose Monitoring Suppl (ONE TOUCH ULTRA 2) w/Device KIT Use as directed 1 each 0  . glimepiride (AMARYL) 2 MG tablet TAKE ONE-HALF TABLET BY MOUTH ONCE DAILYBEFORE BREAKFAST 45 tablet 3  . glucose blood (ONE TOUCH ULTRA TEST) test strip Use to check blood sugars twice a day 100 each 3  . lactulose (CHRONULAC) 10 GM/15ML solution Take 45 mLs (30 g total) by mouth daily as needed for mild constipation. 240 mL 3  . Lancets (ONETOUCH ULTRASOFT) lancets USE TO CHECK BLOOD SUGAR TWICE A DAY 100 each 3  . LORazepam (ATIVAN) 0.5 MG tablet Take 1 tablet (0.5 mg total) by mouth 2 (two) times daily as needed for anxiety. 60 tablet 1  . nystatin cream (MYCOSTATIN) Apply 1 application topically 2 (two) times daily. 30 g 1  . pantoprazole (PROTONIX) 40 MG tablet Take 1 tablet (40 mg total) by mouth daily. 90 tablet 3  . amoxicillin-clavulanate (AUGMENTIN) 875-125 MG tablet Take 1 tablet by mouth 2 (two) times daily. 20 tablet 0  . benzonatate (TESSALON) 100 MG capsule Take 1 capsule (100 mg total) by mouth 3 (three) times daily as needed. 20 capsule 0  . fluconazole (DIFLUCAN) 150 MG tablet Take 1 tablet (150 mg total) by mouth once for 1 dose. Repeat after 72 hours 2 tablet 0  . fluticasone (FLONASE) 50 MCG/ACT nasal spray Place 2 sprays into both nostrils daily. 16 g 6  No current facility-administered medications for this visit.     Allergies: Hydrocodone-homatropine; Sulfa antibiotics; Codeine; Crestor [rosuvastatin calcium]; Doxycycline; Keflex [cephalexin]; Lipitor [atorvastatin calcium]; Naproxen; and Prednisone  Past Medical History:  Diagnosis Date  . Anxiety   . Arthritis    back of neck, bones spurs on neck  . Cataract   . Cervical disc disease   . Diabetes mellitus   .  Hyperlipidemia   . Hypertension   . Mucoid cyst of joint    right thumb  . Neuropathy   . Reflux   . Sleep apnea    wears CPAP nightly  . Vertigo     Past Surgical History:  Procedure Laterality Date  . BLADDER SURGERY    . BREAST EXCISIONAL BIOPSY Bilateral   . BREAST SURGERY    . CHOLECYSTECTOMY    . fibroids removed     breast (both breasts)  . MASS EXCISION Right 06/26/2016   Procedure: EXCISION MUCOID TUMOR RIGHT THUMB, IP RIGHT THUMB;  Surgeon: Daryll Brod, MD;  Location: [facility];  Service: Orthopedics;  Laterality: Right;  . PARTIAL HYSTERECTOMY      Family History  Problem Relation Age of Onset  . Hypertension Other   . Diabetes Father   . Diabetes Sister   . Diabetes Maternal Aunt   . Colon cancer Neg Hx   . Esophageal cancer Neg Hx   . Stomach cancer Neg Hx   . Rectal cancer Neg Hx     Social History   Tobacco Use  . Smoking status: Never Smoker  . Smokeless tobacco: Never Used  Substance Use Topics  . Alcohol use: No    Alcohol/week: 0.0 standard drinks    Subjective:  1 week history of cough/ congestion; + productive cough; ears feel stopped up; using OTC Mucinex, Flonase and Advil; not sleeping well due to cough; no chest pain, no shortness of breath; has felt feverish;   Also concerned that she might have  UTI- urinary hesitancy x 1 week; no burning; +small amounts of urine;     Objective:  Vitals:   12/02/17 1100  BP: 140/70  Pulse: 81  Temp: 98.7 F (37.1 C)  TempSrc: Oral  SpO2: 97%  Weight: 201 lb (91.2 kg)  Height: 5' 4"  (1.626 m)    General: Well developed, well nourished, in no acute distress  Skin : Warm and dry.  Head: Normocephalic and atraumatic  Eyes: Sclera and conjunctiva clear; pupils round and reactive to light; extraocular movements intact  Ears: External normal; canals clear; tympanic membranes erythematous Oropharynx: Pink, supple. No suspicious lesions  Neck: Supple without thyromegaly, adenopathy   Lungs: Respirations unlabored; clear to auscultation bilaterally without wheeze, rales, rhonchi  CVS exam: normal rate and regular rhythm.  Neurologic: Alert and oriented; speech intact; face symmetrical; moves all extremities well; CNII-XII intact without focal deficit   Assessment:   1. Acute sinusitis, recurrence not specified, unspecified location   2. Acute bronchitis, unspecified organism   3. Urinary hesitancy     Plan:  1. & 2. Rx for Augmentin 875 mg bid x 10 days, Flonase NS and Tessalon Perles; increase fluids,rest and follow-up worse, no better. 3. Check U/A and urine culture; hopefully Augmentin will cover for any bacteria in urinary tract as well.   No follow-ups on file.  Orders Placed This Encounter  Procedures  . Urine Culture    Standing Status:   Future    Standing Expiration Date:   12/02/2018  .  POCT Urinalysis Dipstick (Automated)    Requested Prescriptions   Signed Prescriptions Disp Refills  . amoxicillin-clavulanate (AUGMENTIN) 875-125 MG tablet 20 tablet 0    Sig: Take 1 tablet by mouth 2 (two) times daily.  . fluticasone (FLONASE) 50 MCG/ACT nasal spray 16 g 6    Sig: Place 2 sprays into both nostrils daily.  . benzonatate (TESSALON) 100 MG capsule 20 capsule 0    Sig: Take 1 capsule (100 mg total) by mouth 3 (three) times daily as needed.  . fluconazole (DIFLUCAN) 150 MG tablet 2 tablet 0    Sig: Take 1 tablet (150 mg total) by mouth once for 1 dose. Repeat after 72 hours

## 2017-12-03 ENCOUNTER — Encounter: Payer: Self-pay | Admitting: Family

## 2017-12-03 LAB — URINE CULTURE
MICRO NUMBER:: 91399096
SPECIMEN QUALITY:: ADEQUATE

## 2017-12-04 ENCOUNTER — Encounter: Payer: Self-pay | Admitting: Family

## 2017-12-04 ENCOUNTER — Ambulatory Visit (INDEPENDENT_AMBULATORY_CARE_PROVIDER_SITE_OTHER): Payer: Medicare Other | Admitting: Family

## 2017-12-04 VITALS — BP 148/78 | HR 67 | Temp 98.2°F | Ht 64.0 in

## 2017-12-04 DIAGNOSIS — J209 Acute bronchitis, unspecified: Secondary | ICD-10-CM

## 2017-12-04 MED ORDER — ALBUTEROL SULFATE (2.5 MG/3ML) 0.083% IN NEBU
2.5000 mg | INHALATION_SOLUTION | Freq: Once | RESPIRATORY_TRACT | Status: AC
  Administered 2017-12-04: 2.5 mg via RESPIRATORY_TRACT

## 2017-12-04 MED ORDER — AZITHROMYCIN 250 MG PO TABS: ORAL_TABLET | ORAL | 0 refills | Status: DC

## 2017-12-04 MED ORDER — ALBUTEROL SULFATE HFA 108 (90 BASE) MCG/ACT IN AERS: 2.0000 | INHALATION_SPRAY | Freq: Four times a day (QID) | RESPIRATORY_TRACT | 0 refills | Status: DC | PRN

## 2017-12-04 NOTE — Addendum Note (Signed)
Addended by: Marcina Millard on: 12/04/2017 02:46 PM   Modules accepted: Orders

## 2017-12-04 NOTE — Progress Notes (Signed)
Laura [name] is a 72 y.o. female with the following history as recorded in Epic[employer]:  Patient Active Problem List   Diagnosis Date Noted  . Cough 02/23/2017  . Allergic conjunctivitis 12/12/2016  . Left hand pain 05/26/2016  . Mucoid cyst, joint 05/26/2016  . Pain in finger of right hand 05/26/2016  . Primary osteoarthritis of both first carpometacarpal joints 05/26/2016  . Skin avulsion 05/26/2016  . Adhesive capsulitis of left shoulder 03/20/2016  . Fatigue 12/16/2015  . Acute bronchitis 10/31/2015  . Urinary frequency 03/09/2015  . Hearing loss, sensorineural, asymmetrical 11/09/2014  . Vestibular hypofunction, left 11/09/2014  . Morbid obesity ([place]) 07/25/2014  . Orthostatic hypotension 07/07/2014  . Upper airway cough syndrome 06/26/2014  . Diarrhea 05/02/2014  . Dizziness 04/12/2014  . Neuropathy   . Rash and nonspecific skin eruption 08/30/2013  . Paresthesia of both feet 08/30/2013  . Blurred vision 02/15/2013  . Eye pain 02/15/2013  . Sinusitis, chronic 10/07/2012  . Left shoulder pain 04/15/2012  . Preventative health care 10/22/2011  . Cervical disc disease 10/22/2011  . Bilateral hand pain 08/05/2011  . SNHL (sensorineural hearing loss) 08/04/2011  . Fibrocystic breast disease 07/02/2011  . Hypertension 07/01/2011  . GERD (gastroesophageal reflux disease) 06/29/2011  . Bloating 06/29/2011  . Back pain 03/13/2011  . Vertigo 01/04/2011  . Nystagmus 01/04/2011  . Insomnia 10/21/2010  . CONSTIPATION 08/17/2009  . VITAMIN D DEFICIENCY 05/15/2009  . DYSPNEA 03/13/2009  . Depression with anxiety 01/16/2009  . Irritable bowel syndrome 01/16/2009  . Primary osteoarthritis of both hands 12/15/2008  . Diabetes ([place]) 11/03/2008  . Hyperlipidemia 11/03/2008  . Anxiety state 11/03/2008  . OVERACTIVE BLADDER 11/03/2008  . OSA (obstructive sleep apnea) 11/03/2008  . MURMUR 11/03/2008    Current Outpatient Medications  Medication Sig Dispense Refill  .  acetaminophen (TYLENOL) 325 MG tablet Take 325 mg by mouth as needed for mild pain, moderate pain, fever or headache.     Marland Kitchen amLODipine (NORVASC) 5 MG tablet TAKE ONE (1) TABLET BY MOUTH EVERY DAY 90 tablet 3  . benzonatate (TESSALON) 100 MG capsule Take 1 capsule (100 mg total) by mouth 3 (three) times daily as needed. 20 capsule 0  . Blood Glucose Monitoring Suppl (ONE TOUCH ULTRA 2) w/Device KIT Use as directed 1 each 0  . fluconazole (DIFLUCAN) 150 MG tablet   0  . fluticasone (FLONASE) 50 MCG/ACT nasal spray Place 2 sprays into both nostrils daily. 16 g 6  . glimepiride (AMARYL) 2 MG tablet TAKE ONE-HALF TABLET BY MOUTH ONCE DAILYBEFORE BREAKFAST 45 tablet 3  . glucose blood (ONE TOUCH ULTRA TEST) test strip Use to check blood sugars twice a day 100 each 3  . lactulose (CHRONULAC) 10 GM/15ML solution Take 45 mLs (30 g total) by mouth daily as needed for mild constipation. 240 mL 3  . Lancets (ONETOUCH ULTRASOFT) lancets USE TO CHECK BLOOD SUGAR TWICE A DAY 100 each 3  . LORazepam (ATIVAN) 0.5 MG tablet Take 1 tablet (0.5 mg total) by mouth 2 (two) times daily as needed for anxiety. 60 tablet 1  . nystatin cream (MYCOSTATIN) Apply 1 application topically 2 (two) times daily. 30 g 1  . pantoprazole (PROTONIX) 40 MG tablet Take 1 tablet (40 mg total) by mouth daily. 90 tablet 3  . albuterol (PROVENTIL HFA;VENTOLIN HFA) 108 (90 Base) MCG/ACT inhaler Inhale 2 puffs into the lungs every 6 (six) hours as needed for wheezing or shortness of breath. 1 Inhaler 0  . azithromycin (  ZITHROMAX) 250 MG tablet 2 tabs po qd x 1 day; 1 tablet per day x 4 days; 6 tablet 0   No current facility-administered medications for this visit.     Allergies: Hydrocodone-homatropine; Sulfa antibiotics; Augmentin [amoxicillin-pot clavulanate]; Codeine; Crestor [rosuvastatin calcium]; Doxycycline; Keflex [cephalexin]; Lipitor [atorvastatin calcium]; Naproxen; and Prednisone  Past Medical History:  Diagnosis Date  . Anxiety    . Arthritis    back of neck, bones spurs on neck  . Cataract   . Cervical disc disease   . Diabetes mellitus   . Hyperlipidemia   . Hypertension   . Mucoid cyst of joint    right thumb  . Neuropathy   . Reflux   . Sleep apnea    wears CPAP nightly  . Vertigo     Past Surgical History:  Procedure Laterality Date  . BLADDER SURGERY    . BREAST EXCISIONAL BIOPSY Bilateral   . BREAST SURGERY    . CHOLECYSTECTOMY    . fibroids removed     breast (both breasts)  . MASS EXCISION Right 06/26/2016   Procedure: EXCISION MUCOID TUMOR RIGHT THUMB, IP RIGHT THUMB;  Surgeon: Daryll Brod, MD;  Location:  [facility];  Service: Orthopedics;  Laterality: Right;  . PARTIAL HYSTERECTOMY      Family History  Problem Relation Age of Onset  . Hypertension Other   . Diabetes Father   . Diabetes Sister   . Diabetes Maternal Aunt   . Colon cancer Neg Hx   . Esophageal cancer Neg Hx   . Stomach cancer Neg Hx   . Rectal cancer Neg Hx     Social History   Tobacco Use  . Smoking status: Never Smoker  . Smokeless tobacco: Never Used  Substance Use Topics  . Alcohol use: No    Alcohol/week: 0.0 standard drinks    Subjective:  2 day follow-up on cough; has developed diarrhea in the past 24 hours; notes that she used Vicks Vapo-rub last night with some relief; continues to be adamant that she does not want any type of steroid; + persisting, barking cough;   Objective:  Vitals:   12/04/17 1035  BP: (!) 148/78  Pulse: 67  Temp: 98.2 F (36.8 C)  TempSrc: Oral  SpO2: 98%  Height: _0  (1.626 m)    General: Well developed, well nourished, in no acute distress  Skin : Warm and dry.  Head: Normocephalic and atraumatic  Eyes: Sclera and conjunctiva clear; pupils round and reactive to light; extraocular movements intact  Ears: External normal; canals clear; tympanic membranes normal  Oropharynx: Pink, supple. No suspicious lesions  Neck: Supple without thyromegaly, adenopathy   Lungs: Respirations unlabored; clear to auscultation bilaterally without wheeze, rales, rhonchi  CVS exam: normal rate and regular rhythm.  Neurologic: Alert and oriented; speech intact; face symmetrical; moves all extremities well; CNII-XII intact without focal deficit   Assessment:  1. Acute bronchitis, unspecified organism   2. Bronchospasm with bronchitis, acute     Plan:  Albuterol nebulizer given in office with relief; d/c Augmentin due to GI side effects; change to Z-pak- take as directed; patient defers oral or inhaled steroids today but does agree to trial of albuterol to use as needed; increase fluids, rest and follow-up worse, no better.   No follow-ups on file.  No orders of the defined types were placed in this encounter.   Requested Prescriptions   Signed Prescriptions Disp Refills  . azithromycin (ZITHROMAX) 250 MG tablet 6  tablet 0    Sig: 2 tabs po qd x 1 day; 1 tablet per day x 4 days;  . albuterol (PROVENTIL HFA;VENTOLIN HFA) 108 (90 Base) MCG/ACT inhaler 1 Inhaler 0    Sig: Inhale 2 puffs into the lungs every 6 (six) hours as needed for wheezing or shortness of breath.

## 2017-12-07 ENCOUNTER — Encounter: Payer: Self-pay | Admitting: Family

## 2017-12-07 NOTE — Telephone Encounter (Signed)
I really think we need to consider some type of steroid option for her- would she be willing to try a steroid inhaler? Her urine culture was clear- I am not sure what other labs she is asking about in her other message.

## 2017-12-14 ENCOUNTER — Encounter: Payer: Self-pay | Admitting: Family

## 2017-12-14 NOTE — Telephone Encounter (Signed)
1) Would recommend to update CXR; 2) She should schedule a follow-up with her PCP or pulmonology for follow-up. Maybe tomorrow or Wednesday?

## 2017-12-14 NOTE — Telephone Encounter (Signed)
Please advise. I have told her to what you have recommended but doesn't look like she wants to do the inhaler?  Thanks

## 2017-12-17 DIAGNOSIS — H5203 Hypermetropia, bilateral: Secondary | ICD-10-CM | POA: Diagnosis not present

## 2017-12-17 DIAGNOSIS — H2513 Age-related nuclear cataract, bilateral: Secondary | ICD-10-CM | POA: Diagnosis not present

## 2017-12-17 LAB — HM DIABETES EYE EXAM

## 2017-12-21 ENCOUNTER — Encounter: Payer: Self-pay | Admitting: Internal Medicine

## 2017-12-21 ENCOUNTER — Ambulatory Visit (INDEPENDENT_AMBULATORY_CARE_PROVIDER_SITE_OTHER): Payer: Medicare Other | Admitting: Internal Medicine

## 2017-12-21 VITALS — BP 146/82 | HR 99 | Temp 98.3°F | Ht 64.0 in | Wt 198.0 lb

## 2017-12-21 DIAGNOSIS — J329 Chronic sinusitis, unspecified: Secondary | ICD-10-CM | POA: Diagnosis not present

## 2017-12-21 DIAGNOSIS — H9193 Unspecified hearing loss, bilateral: Secondary | ICD-10-CM | POA: Diagnosis not present

## 2017-12-21 DIAGNOSIS — H6993 Unspecified Eustachian tube disorder, bilateral: Secondary | ICD-10-CM | POA: Diagnosis not present

## 2017-12-21 NOTE — Patient Instructions (Signed)
Your ears were irrigated of the wax impactions today  Please take all new medication - OTC Mucinex  Please continue all other medications as before, including the flonase  Please have the pharmacy call with any other refills you may need.  Please continue your efforts at being more active, low cholesterol diet, and weight control.  Please keep your appointments with your specialists as you may have planned

## 2017-12-21 NOTE — Progress Notes (Signed)
Subjective:    Patient ID: Laura [name], female    DOB: 1945-11-15, 72 y.o.   MRN: [id number]  HPI    Here after recent URI symtpoms and cough, now with persistent bilat hearing loss without pain, fever, sinus or nasal congestion, colored drainage, but with clearish drainage occasional and crackling, popping to both ears with valsalva and hearing improved for a few minutes; No ST, cough, HA or ear pain.  Pt denies chest pain, increased sob or doe, wheezing, orthopnea, PND, increased LE swelling, palpitations, dizziness or syncope.  Pt denies new neurological symptoms such as new headache, or facial or extremity weakness or numbness   Pt denies polydipsia, polyuria Past Medical History:  Diagnosis Date  . Anxiety   . Arthritis    back of neck, bones spurs on neck  . Cataract   . Cervical disc disease   . Diabetes mellitus   . Hyperlipidemia   . Hypertension   . Mucoid cyst of joint    right thumb  . Neuropathy   . Reflux   . Sleep apnea    wears CPAP nightly  . Vertigo    Past Surgical History:  Procedure Laterality Date  . BLADDER SURGERY    . BREAST EXCISIONAL BIOPSY Bilateral   . BREAST SURGERY    . CHOLECYSTECTOMY    . fibroids removed     breast (both breasts)  . MASS EXCISION Right 06/26/2016   Procedure: EXCISION MUCOID TUMOR RIGHT THUMB, IP RIGHT THUMB;  Surgeon: Daryll Brod, MD;  Location: [facility];  Service: Orthopedics;  Laterality: Right;  . PARTIAL HYSTERECTOMY      reports that she has never smoked. She has never used smokeless tobacco. She reports that she does not drink alcohol or use drugs. family history includes Diabetes in her father, maternal aunt, and sister; Hypertension in her other. Allergies  Allergen Reactions  . Hydrocodone-Homatropine Other (See Comments)    Vertigo *pt strongly prefers to never take*  . Sulfa Antibiotics     Tongue swells, hives, itching  . Augmentin [Amoxicillin-Pot Clavulanate]     Diarrhea; can take PCN/  Amoxicillin  . Codeine     itch  . Crestor [Rosuvastatin Calcium] Other (See Comments)    Did something to memory   . Doxycycline   . Keflex [Cephalexin] Diarrhea and Nausea And Vomiting  . Lipitor [Atorvastatin Calcium] Other (See Comments)    Makes weak   . Naproxen Other (See Comments)    Stomach cramps  . Prednisone Other (See Comments)    Nervous *pt strongly prefers to never be given prednisone*    Current Outpatient Medications on File Prior to Visit  Medication Sig Dispense Refill  . acetaminophen (TYLENOL) 325 MG tablet Take 325 mg by mouth as needed for mild pain, moderate pain, fever or headache.     . albuterol (PROVENTIL HFA;VENTOLIN HFA) 108 (90 Base) MCG/ACT inhaler Inhale 2 puffs into the lungs every 6 (six) hours as needed for wheezing or shortness of breath. 1 Inhaler 0  . amLODipine (NORVASC) 5 MG tablet TAKE ONE (1) TABLET BY MOUTH EVERY DAY 90 tablet 3  . Blood Glucose Monitoring Suppl (ONE TOUCH ULTRA 2) w/Device KIT Use as directed 1 each 0  . fluticasone (FLONASE) 50 MCG/ACT nasal spray Place 2 sprays into both nostrils daily. 16 g 6  . glimepiride (AMARYL) 2 MG tablet TAKE ONE-HALF TABLET BY MOUTH ONCE DAILYBEFORE BREAKFAST 45 tablet 3  . glucose blood (ONE TOUCH  ULTRA TEST) test strip Use to check blood sugars twice a day 100 each 3  . lactulose (CHRONULAC) 10 GM/15ML solution Take 45 mLs (30 g total) by mouth daily as needed for mild constipation. 240 mL 3  . Lancets (ONETOUCH ULTRASOFT) lancets USE TO CHECK BLOOD SUGAR TWICE A DAY 100 each 3  . LORazepam (ATIVAN) 0.5 MG tablet Take 1 tablet (0.5 mg total) by mouth 2 (two) times daily as needed for anxiety. 60 tablet 1  . pantoprazole (PROTONIX) 40 MG tablet Take 1 tablet (40 mg total) by mouth daily. 90 tablet 3   No current facility-administered medications on file prior to visit.    Review of Systems  Constitutional: Negative for other unusual diaphoresis or sweats HENT: Negative for ear discharge or  swelling Eyes: Negative for other worsening visual disturbances Respiratory: Negative for stridor or other swelling  Gastrointestinal: Negative for worsening distension or other blood Genitourinary: Negative for retention or other urinary change Musculoskeletal: Negative for other MSK pain or swelling Skin: Negative for color change or other new lesions Neurological: Negative for worsening tremors and other numbness  Psychiatric/Behavioral: Negative for worsening agitation or other fatigue All other system neg per pt    Objective:   Physical Exam BP (!) 146/82   Pulse 99   Temp 98.3 F (36.8 C) (Oral)   Ht _0  (1.626 m)   Wt 198 lb (89.8 kg)   SpO2 99%   BMI 33.99 kg/m  VS noted, not ill appearing Constitutional: Pt appears in NAD HENT: Head: NCAT.  Right Ear: External ear normal.  Left Ear: External ear normal.  Eyes: . Pupils are equal, round, and reactive to light. Conjunctivae and EOM are normal Bilat tm's with mild erythema.  Max sinus areas non tender.  Pharynx with mild erythema, no exudateNose: without d/c or deformity; bilat wax impactions resolved with irrigation Neck: Neck supple. Gross normal ROM Cardiovascular: Normal rate and regular rhythm.   Pulmonary/Chest: Effort normal and breath sounds without rales or wheezing.  Neurological: Pt is alert. At baseline orientation, motor grossly intact Skin: Skin is warm. No rashes, other new lesions, no LE edema Psychiatric: Pt behavior is normal without agitation  No other exam findings Lab Results  Component Value Date   WBC 7.9 08/25/2017   HGB 13.2 08/25/2017   HCT 39.1 08/25/2017   PLT 271.0 08/25/2017   GLUCOSE 134 (H) 08/25/2017   CHOL 243 (H) 08/25/2017   TRIG 234.0 (H) 08/25/2017   HDL 55.30 08/25/2017   LDLDIRECT 167.0 08/25/2017   LDLCALC 166 (H) 04/12/2014   ALT 13 08/25/2017   AST 15 08/25/2017   NA 140 08/25/2017   K 3.9 08/25/2017   CL 107 08/25/2017   CREATININE 0.76 08/25/2017   BUN 11  08/25/2017   CO2 29 08/25/2017   TSH 2.17 08/25/2017   HGBA1C 7.4 (H) 08/25/2017   MICROALBUR 1.0 08/25/2017       Assessment & Plan:

## 2017-12-21 NOTE — Assessment & Plan Note (Signed)
For nasacorrt asd,  to f/u any worsening symptoms or concerns

## 2017-12-21 NOTE — Assessment & Plan Note (Signed)
Improved with irrigation wax impactions,  to f/u any worsening symptoms or concerns

## 2017-12-21 NOTE — Assessment & Plan Note (Signed)
Mild to mod, for mucinex prn,,  to f/u any worsening symptoms or concerns

## 2017-12-31 ENCOUNTER — Ambulatory Visit (INDEPENDENT_AMBULATORY_CARE_PROVIDER_SITE_OTHER): Payer: Self-pay

## 2017-12-31 ENCOUNTER — Encounter (INDEPENDENT_AMBULATORY_CARE_PROVIDER_SITE_OTHER): Payer: Self-pay | Admitting: Specialist

## 2017-12-31 ENCOUNTER — Ambulatory Visit (INDEPENDENT_AMBULATORY_CARE_PROVIDER_SITE_OTHER): Payer: Medicare Other | Admitting: Specialist

## 2017-12-31 VITALS — BP 155/74 | HR 82 | Ht 64.0 in | Wt 198.0 lb

## 2017-12-31 DIAGNOSIS — M25512 Pain in left shoulder: Secondary | ICD-10-CM | POA: Diagnosis not present

## 2017-12-31 DIAGNOSIS — M542 Cervicalgia: Secondary | ICD-10-CM | POA: Diagnosis not present

## 2017-12-31 DIAGNOSIS — M25511 Pain in right shoulder: Secondary | ICD-10-CM

## 2017-12-31 DIAGNOSIS — M19012 Primary osteoarthritis, left shoulder: Secondary | ICD-10-CM

## 2017-12-31 DIAGNOSIS — M19011 Primary osteoarthritis, right shoulder: Secondary | ICD-10-CM | POA: Diagnosis not present

## 2017-12-31 MED ORDER — TRAMADOL HCL 50 MG PO TABS: 50.0000 mg | ORAL_TABLET | Freq: Four times a day (QID) | ORAL | 0 refills | Status: DC | PRN

## 2017-12-31 NOTE — Progress Notes (Signed)
Office Visit Note   Patient: Laura [name]           Date of Birth: 02/15/1945           MRN: [id number] Visit Date: 12/31/2017              Requested by: Biagio Borg, MD [address], [place] [id number] PCP: Biagio Borg, MD   Assessment & Plan: Visit Diagnoses:  1. Neck pain   2. Bilateral shoulder pain, unspecified chronicity   3. Primary osteoarthritis, left shoulder   4. Primary osteoarthritis, right shoulder     Plan: Avoid overhead lifting and overhead use of the arms. Pillows to keep from sleeping directly on the shoulders Limited lifting to less than 10 lbs. Ice or heat for relief. Tramadol for discomfort NSAIDs are helpful, such as alleve or motrin, be careful not to use in excess as they place burdens on the kidney. Stretching exercise help and strengthening is helpful to build endurance. Consider Lena. Com  Hemp Oil  Capsules 7,000 mg 120 capsules take 2 per day.  Follow-Up Instructions: Return in about 4 weeks (around 01/28/2018).   Orders:  Orders Placed This Encounter  Procedures  . XR Cervical Spine 2 or 3 views  . XR Shoulder Left  . XR Shoulder Right   No orders of the defined types were placed in this encounter.     Procedures: No procedures performed   Clinical Data: No additional findings.   Subjective: Chief Complaint  Patient presents with  . Neck - Pain    72 year old right handed female with 2 month history of right shoulder pain and pain with rolling on to the right shoulder and pain with overhead lifting and over head use of the right arm.    Review of Systems  Constitutional: Negative.   HENT: Positive for congestion.   Eyes: Negative.   Respiratory: Positive for cough. Negative for choking.   Cardiovascular: Negative.   Gastrointestinal: Negative.   Endocrine: Negative.   Genitourinary: Negative.   Musculoskeletal: Negative.   Skin: Negative.   Allergic/Immunologic: Negative.   Neurological: Negative.    Hematological: Negative.   Psychiatric/Behavioral: Negative.      Objective: Vital Signs: BP (!) 155/74 (BP Location: Left Arm, Patient Position: Sitting)   Pulse 82   Ht 5\' 4"  (1.626 m)   Wt 198 lb (89.8 kg)   BMI 33.99 kg/m   Physical Exam Constitutional:      Appearance: She is well-developed.  HENT:     Head: Normocephalic and atraumatic.  Eyes:     Pupils: Pupils are equal, round, and reactive to light.  Neck:     Musculoskeletal: Normal range of motion and neck supple.  Pulmonary:     Effort: Pulmonary effort is normal.     Breath sounds: Normal breath sounds.  Abdominal:     General: Bowel sounds are normal.     Palpations: Abdomen is soft.  Skin:    General: Skin is warm and dry.  Neurological:     Mental Status: She is alert and oriented to person, place, and time.  Psychiatric:        Behavior: Behavior normal.        Thought Content: Thought content normal.        Judgment: Judgment normal.     Right Shoulder Exam   Tenderness  The patient is experiencing tenderness in the acromion and acromioclavicular joint.  Range  of Motion  Active abduction:  140 abnormal  Passive abduction: abnormal  Extension:  40 normal  External rotation: 80  Forward flexion: 130  Internal rotation 0 degrees: normal  Internal rotation 90 degrees: normal   Muscle Strength  Abduction: 4/5  Internal rotation: 5/5  External rotation: 5/5  Supraspinatus: 4/5  Subscapularis: 5/5  Biceps: 5/5   Tests  Apprehension: negative Hawkins test: negative Cross arm: negative Impingement: positive Drop arm: negative Sulcus: absent  Other  Erythema: absent Scars: absent Sensation: normal Pulse: present   Left Shoulder Exam   Tenderness  The patient is experiencing tenderness in the acromion and acromioclavicular joint.  Range of Motion  Active abduction: normal  Passive abduction: normal  Extension: normal  External rotation: normal  Forward flexion: normal    Internal rotation 0 degrees: normal  Internal rotation 90 degrees: normal   Muscle Strength  Abduction: 5/5  Internal rotation: 5/5  External rotation: 5/5  Supraspinatus: 5/5  Subscapularis: 5/5  Biceps: 5/5   Tests  Apprehension: negative Hawkins test: negative Cross arm: negative Impingement: positive Drop arm: negative Sulcus: absent  Other  Erythema: absent Scars: absent Sensation: normal Pulse: present       Specialty Comments:  No specialty comments available.  Imaging: Xr Cervical Spine 2 Or 3 Views  Result Date: 12/31/2017 AP and lateral flexion and extension radiographs with DDD C5-6 and C6-7, mild anterior and posterior osteophytes at this level. The space available for the spinal cord is large.   Xr Shoulder Left  Result Date: 12/31/2017 Leftt shoulder 3 views show the joint line is well maintained on AP and axillary lateral, large inferomedial osteophyte of the right humeral head. SAS is 11 mm. There is moderate OA of the left A-C joint. There is calcification of the articular cartilage of the humeral head medially seen on the outlet view.   Xr Shoulder Right  Result Date: 12/31/2017 Right shoulder 3 views show the joint line is well maintained on AP and axillary lateral, large inferomedial osteophyte of the right humeral head. SAS is 11 mm. There is moderate OA of the right A-C joint. There is calcification of the articular cartilage of the humeral head medially seen on the outlet fie    PMFS History: Patient Active Problem List   Diagnosis Date Noted  . Bilateral hearing loss 12/21/2017  . Eustachian tube disorder, bilateral 12/21/2017  . Cough 02/23/2017  . Allergic conjunctivitis 12/12/2016  . Left hand pain 05/26/2016  . Mucoid cyst, joint 05/26/2016  . Pain in finger of right hand 05/26/2016  . Primary osteoarthritis of both first carpometacarpal joints 05/26/2016  . Skin avulsion 05/26/2016  . Adhesive capsulitis of left shoulder  03/20/2016  . Fatigue 12/16/2015  . Acute bronchitis 10/31/2015  . Urinary frequency 03/09/2015  . Hearing loss, sensorineural, asymmetrical 11/09/2014  . Vestibular hypofunction, left 11/09/2014  . Morbid obesity ([place]) 07/25/2014  . Orthostatic hypotension 07/07/2014  . Upper airway cough syndrome 06/26/2014  . Diarrhea 05/02/2014  . Dizziness 04/12/2014  . Neuropathy   . Rash and nonspecific skin eruption 08/30/2013  . Paresthesia of both feet 08/30/2013  . Blurred vision 02/15/2013  . Eye pain 02/15/2013  . Sinusitis, chronic 10/07/2012  . Left shoulder pain 04/15/2012  . Preventative health care 10/22/2011  . Cervical disc disease 10/22/2011  . Bilateral hand pain 08/05/2011  . SNHL (sensorineural hearing loss) 08/04/2011  . Fibrocystic breast disease 07/02/2011  . Hypertension 07/01/2011  . GERD (gastroesophageal reflux disease) 06/29/2011  .  Bloating 06/29/2011  . Back pain 03/13/2011  . Vertigo 01/04/2011  . Nystagmus 01/04/2011  . Insomnia 10/21/2010  . CONSTIPATION 08/17/2009  . VITAMIN D DEFICIENCY 05/15/2009  . DYSPNEA 03/13/2009  . Depression with anxiety 01/16/2009  . Irritable bowel syndrome 01/16/2009  . Primary osteoarthritis of both hands 12/15/2008  . Diabetes ([place]) 11/03/2008  . Hyperlipidemia 11/03/2008  . Anxiety state 11/03/2008  . OVERACTIVE BLADDER 11/03/2008  . OSA (obstructive sleep apnea) 11/03/2008  . MURMUR 11/03/2008   Past Medical History:  Diagnosis Date  . Anxiety   . Arthritis    back of neck, bones spurs on neck  . Cataract   . Cervical disc disease   . Diabetes mellitus   . Hyperlipidemia   . Hypertension   . Mucoid cyst of joint    right thumb  . Neuropathy   . Reflux   . Sleep apnea    wears CPAP nightly  . Vertigo     Family History  Problem Relation Age of Onset  . Hypertension Other   . Diabetes Father   . Diabetes Sister   . Diabetes Maternal Aunt   . Colon cancer Neg Hx   . Esophageal cancer Neg Hx   .  Stomach cancer Neg Hx   . Rectal cancer Neg Hx     Past Surgical History:  Procedure Laterality Date  . BLADDER SURGERY    . BREAST EXCISIONAL BIOPSY Bilateral   . BREAST SURGERY    . CHOLECYSTECTOMY    . fibroids removed     breast (both breasts)  . MASS EXCISION Right 06/26/2016   Procedure: EXCISION MUCOID TUMOR RIGHT THUMB, IP RIGHT THUMB;  Surgeon: Daryll Brod, MD;  Location: [facility];  Service: Orthopedics;  Laterality: Right;  . PARTIAL HYSTERECTOMY     Social History   Occupational History  . Occupation: retired Geographical information systems officer  Tobacco Use  . Smoking status: Never Smoker  . Smokeless tobacco: Never Used  Substance and Sexual Activity  . Alcohol use: No    Alcohol/week: 0.0 standard drinks  . Drug use: No  . Sexual activity: Not Currently

## 2017-12-31 NOTE — Patient Instructions (Addendum)
Avoid overhead lifting and overhead use of the arms. Pillows to keep from sleeping directly on the shoulders Limited lifting to less than 10 lbs. Ice or heat for relief. Tramadol for discomfort NSAIDs are helpful, such as alleve or motrin, be careful not to use in excess as they place burdens on the kidney. Stretching exercise help and strengthening is helpful to build endurance. Consider [place]. Com  Hemp Oil  Capsules 7,000 mg 120 capsules take 2 per day.

## 2018-01-01 ENCOUNTER — Ambulatory Visit: Payer: Self-pay | Admitting: Gastroenterology

## 2018-01-01 ENCOUNTER — Ambulatory Visit (INDEPENDENT_AMBULATORY_CARE_PROVIDER_SITE_OTHER): Payer: Medicare Other | Admitting: Gastroenterology

## 2018-01-01 ENCOUNTER — Encounter: Payer: Self-pay | Admitting: Gastroenterology

## 2018-01-01 VITALS — BP 146/74 | HR 68 | Ht 64.0 in | Wt 200.0 lb

## 2018-01-01 DIAGNOSIS — K582 Mixed irritable bowel syndrome: Secondary | ICD-10-CM | POA: Diagnosis not present

## 2018-01-01 NOTE — Progress Notes (Signed)
Review of pertinent gastrointestinal problems: 1. Constipation: functional 2. Adenomatous polyp: 07/2011 Colonoscopy Dr. Ardis Hughs for constipation; single small TA (subCM), recommended recall colonoscopy at 5 years.  Colonoscopy 09/2016 was normal.  Recommended 10 year screening.   HPI: This is a very pleasant 72 year old woman whom I last saw about a year and a half ago.  She brought a Christmas card with her today to give to me.  Alternating constipation, loose stools.  Never sees blood in her stool.  Stable weight.  She was here this past May, about 6 months ago, with some constipation that started after antibiotic use.  CBC August 2019 was normal  Lately she takes imodium 2-3 times per week, sometimes just a 1/2 pill, sometimes 1 pill.  Very rarely more than one pill.  She tried fiber supplement in the remote past.  Not sure if it helped or hurt.  Bowel purge 69month ago led to diarrhea for days.   Chief complaint is alternating constipation, loose stools  ROS: complete GI ROS as described in HPI, all other review negative.  Constitutional:  No unintentional weight loss   Past Medical History:  Diagnosis Date  . Anxiety   . Arthritis    back of neck, bones spurs on neck  . Cataract   . Cervical disc disease   . Diabetes mellitus   . Hyperlipidemia   . Hypertension   . Mucoid cyst of joint    right thumb  . Neuropathy   . Reflux   . Sleep apnea    wears CPAP nightly  . Vertigo     Past Surgical History:  Procedure Laterality Date  . BLADDER SURGERY    . BREAST EXCISIONAL BIOPSY Bilateral   . BREAST SURGERY    . CHOLECYSTECTOMY    . fibroids removed     breast (both breasts)  . MASS EXCISION Right 06/26/2016   Procedure: EXCISION MUCOID TUMOR RIGHT THUMB, IP RIGHT THUMB;  Surgeon: KDaryll Brod MD;  Location: [facility]  Service: Orthopedics;  Laterality: Right;  . PARTIAL HYSTERECTOMY      Current Outpatient Medications  Medication Sig  Dispense Refill  . acetaminophen (TYLENOL) 325 MG tablet Take 325 mg by mouth as needed for mild pain, moderate pain, fever or headache.     . albuterol (PROVENTIL HFA;VENTOLIN HFA) 108 (90 Base) MCG/ACT inhaler Inhale 2 puffs into the lungs every 6 (six) hours as needed for wheezing or shortness of breath. 1 Inhaler 0  . amLODipine (NORVASC) 5 MG tablet TAKE ONE (1) TABLET BY MOUTH EVERY DAY 90 tablet 3  . Blood Glucose Monitoring Suppl (ONE TOUCH ULTRA 2) w/Device KIT Use as directed 1 each 0  . fluticasone (FLONASE) 50 MCG/ACT nasal spray Place 2 sprays into both nostrils daily. 16 g 6  . glimepiride (AMARYL) 2 MG tablet TAKE ONE-HALF TABLET BY MOUTH ONCE DAILYBEFORE BREAKFAST 45 tablet 3  . glucose blood (ONE TOUCH ULTRA TEST) test strip Use to check blood sugars twice a day 100 each 3  . Lancets (ONETOUCH ULTRASOFT) lancets USE TO CHECK BLOOD SUGAR TWICE A DAY 100 each 3  . loperamide (IMODIUM) 2 MG capsule Take by mouth as needed for diarrhea or loose stools.    .Marland KitchenLORazepam (ATIVAN) 0.5 MG tablet Take 1 tablet (0.5 mg total) by mouth 2 (two) times daily as needed for anxiety. 60 tablet 1  . pantoprazole (PROTONIX) 40 MG tablet Take 1 tablet (40 mg total) by mouth daily. 90 tablet 3  .  traMADol (ULTRAM) 50 MG tablet Take 1 tablet (50 mg total) by mouth every 6 (six) hours as needed for up to 7 days for moderate pain. 30 tablet 0   No current facility-administered medications for this visit.     Allergies as of 01/01/2018 - Review Complete 01/01/2018  Allergen Reaction Noted  . Hydrocodone-homatropine Other (See Comments) 04/19/2013  . Sulfa antibiotics  10/21/2010  . Augmentin [amoxicillin-pot clavulanate]  12/04/2017  . Codeine  10/21/2010  . Crestor [rosuvastatin calcium] Other (See Comments) 10/23/2010  . Doxycycline  03/10/2017  . Keflex [cephalexin] Diarrhea and Nausea And Vomiting 06/20/2015  . Lipitor [atorvastatin calcium] Other (See Comments) 10/23/2010  . Naproxen Other (See  Comments) 06/23/2016  . Prednisone Other (See Comments) 10/21/2010    Family History  Problem Relation Age of Onset  . Hypertension Other   . Diabetes Father   . Diabetes Sister   . Diabetes Maternal Aunt   . Colon cancer Neg Hx   . Esophageal cancer Neg Hx   . Stomach cancer Neg Hx   . Rectal cancer Neg Hx     Social History   Socioeconomic History  . Marital status: Divorced    Spouse name: Not on file  . Number of children: 4  . Years of education: 58  . Highest education level: Not on file  Occupational History  . Occupation: retired Geographical information systems officer  Social Needs  . Financial resource strain: Not on file  . Food insecurity:    Worry: Not on file    Inability: Not on file  . Transportation needs:    Medical: Not on file    Non-medical: Not on file  Tobacco Use  . Smoking status: Never Smoker  . Smokeless tobacco: Never Used  Substance and Sexual Activity  . Alcohol use: No    Alcohol/week: 0.0 standard drinks  . Drug use: No  . Sexual activity: Not Currently  Lifestyle  . Physical activity:    Days per week: Not on file    Minutes per session: Not on file  . Stress: Not on file  Relationships  . Social connections:    Talks on phone: Not on file    Gets together: Not on file    Attends religious service: Not on file    Active member of club or organization: Not on file    Attends meetings of clubs or organizations: Not on file    Relationship status: Not on file  . Intimate partner violence:    Fear of current or ex partner: Not on file    Emotionally abused: Not on file    Physically abused: Not on file    Forced sexual activity: Not on file  Other Topics Concern  . Not on file  Social History Narrative  . Not on file     Physical Exam: BP (!) 146/74   Pulse 68   Ht 5' 4" (1.626 m)   Wt 200 lb (90.7 kg)   BMI 34.33 kg/m  Constitutional: generally well-appearing Psychiatric: alert and oriented x3 Abdomen: soft, nontender,  nondistended, no obvious ascites, no peritoneal signs, normal bowel sounds No peripheral edema noted in lower extremities  Assessment and plan: 72 y.o. female with classic alternating IBS  I am going to start her on a daily fiber supplements, slowly ramping up over a week to 10 days.  She will call to report on her response in 4 weeks.  She had a colonoscopy about a year ago and  that certainly does not need to be repeated now.  Please see the "Patient Instructions" section for addition details about the plan.  Owens Loffler, MD [facility] 01/01/2018, 11:08 AM

## 2018-01-01 NOTE — Patient Instructions (Addendum)
Please start taking citrucel (orange flavored) powder fiber supplement.  This may cause some bloating at first but that usually goes away. Begin with a small spoonful and work your way up to a large, heaping spoonful daily over a week.  Call Dr. Ardis Hughs' office in 4 weeks to report on your response.  Thank you for entrusting me with your care and choosing [facility].  Dr Ardis Hughs

## 2018-01-14 ENCOUNTER — Telehealth: Payer: Self-pay | Admitting: Gastroenterology

## 2018-01-14 NOTE — Telephone Encounter (Signed)
Pt called to update Dr. Ardis Hughs on her progress. She stated to feel much better, states that after she saw him she was having bms 4 times a day, now she has 2 bms/day and stool is hard.

## 2018-01-14 NOTE — Telephone Encounter (Signed)
FYI Dr Ardis Hughs please see update

## 2018-02-04 ENCOUNTER — Ambulatory Visit (INDEPENDENT_AMBULATORY_CARE_PROVIDER_SITE_OTHER): Payer: Self-pay | Admitting: Specialist

## 2018-02-25 ENCOUNTER — Ambulatory Visit: Payer: Self-pay | Admitting: Internal Medicine

## 2018-03-02 ENCOUNTER — Ambulatory Visit: Payer: Self-pay | Admitting: Internal Medicine

## 2018-03-09 ENCOUNTER — Encounter: Payer: Self-pay | Admitting: Internal Medicine

## 2018-03-09 ENCOUNTER — Other Ambulatory Visit (INDEPENDENT_AMBULATORY_CARE_PROVIDER_SITE_OTHER): Payer: Medicare Other

## 2018-03-09 ENCOUNTER — Ambulatory Visit (INDEPENDENT_AMBULATORY_CARE_PROVIDER_SITE_OTHER): Payer: Medicare Other | Admitting: Internal Medicine

## 2018-03-09 VITALS — BP 136/76 | HR 87 | Temp 97.9°F | Ht 64.0 in | Wt 200.0 lb

## 2018-03-09 DIAGNOSIS — Z0000 Encounter for general adult medical examination without abnormal findings: Secondary | ICD-10-CM

## 2018-03-09 DIAGNOSIS — E1140 Type 2 diabetes mellitus with diabetic neuropathy, unspecified: Secondary | ICD-10-CM

## 2018-03-09 DIAGNOSIS — G4733 Obstructive sleep apnea (adult) (pediatric): Secondary | ICD-10-CM

## 2018-03-09 DIAGNOSIS — I10 Essential (primary) hypertension: Secondary | ICD-10-CM

## 2018-03-09 LAB — URINALYSIS, ROUTINE W REFLEX MICROSCOPIC
Bilirubin Urine: NEGATIVE
Hgb urine dipstick: NEGATIVE
Ketones, ur: NEGATIVE
Leukocytes,Ua: NEGATIVE
Nitrite: NEGATIVE
PH: 6 (ref 5.0–8.0)
RBC / HPF: NONE SEEN (ref 0–?)
Specific Gravity, Urine: 1.01 (ref 1.000–1.030)
TOTAL PROTEIN, URINE-UPE24: NEGATIVE
Urine Glucose: NEGATIVE
Urobilinogen, UA: 0.2 (ref 0.0–1.0)
WBC, UA: NONE SEEN (ref 0–?)

## 2018-03-09 LAB — CBC WITH DIFFERENTIAL/PLATELET
Basophils Absolute: 0.1 K/uL (ref 0.0–0.1)
Basophils Relative: 0.6 % (ref 0.0–3.0)
Eosinophils Absolute: 0.2 K/uL (ref 0.0–0.7)
Eosinophils Relative: 1.8 % (ref 0.0–5.0)
HCT: 40.5 % (ref 36.0–46.0)
Hemoglobin: 13.6 g/dL (ref 12.0–15.0)
Lymphocytes Relative: 36.4 % (ref 12.0–46.0)
Lymphs Abs: 3.2 K/uL (ref 0.7–4.0)
MCHC: 33.6 g/dL (ref 30.0–36.0)
MCV: 87 fl (ref 78.0–100.0)
Monocytes Absolute: 0.5 K/uL (ref 0.1–1.0)
Monocytes Relative: 5.8 % (ref 3.0–12.0)
Neutro Abs: 4.9 K/uL (ref 1.4–7.7)
Neutrophils Relative %: 55.4 % (ref 43.0–77.0)
Platelets: 285 K/uL (ref 150.0–400.0)
RBC: 4.66 Mil/uL (ref 3.87–5.11)
RDW: 14.1 % (ref 11.5–15.5)
WBC: 8.9 K/uL (ref 4.0–10.5)

## 2018-03-09 LAB — MICROALBUMIN / CREATININE URINE RATIO
CREATININE, U: 46.1 mg/dL
Microalb Creat Ratio: 1.5 mg/g (ref 0.0–30.0)
Microalb, Ur: 0.7 mg/dL (ref 0.0–1.9)

## 2018-03-09 LAB — BASIC METABOLIC PANEL WITH GFR
BUN: 16 mg/dL (ref 6–23)
CO2: 25 meq/L (ref 19–32)
Calcium: 10 mg/dL (ref 8.4–10.5)
Chloride: 103 meq/L (ref 96–112)
Creatinine, Ser: 0.76 mg/dL (ref 0.40–1.20)
GFR: 90.19 mL/min
Glucose, Bld: 134 mg/dL — ABNORMAL HIGH (ref 70–99)
Potassium: 4.4 meq/L (ref 3.5–5.1)
Sodium: 139 meq/L (ref 135–145)

## 2018-03-09 LAB — HEPATIC FUNCTION PANEL
ALT: 15 U/L (ref 0–35)
AST: 18 U/L (ref 0–37)
Albumin: 4.6 g/dL (ref 3.5–5.2)
Alkaline Phosphatase: 85 U/L (ref 39–117)
Bilirubin, Direct: 0 mg/dL (ref 0.0–0.3)
Total Bilirubin: 0.3 mg/dL (ref 0.2–1.2)
Total Protein: 7.6 g/dL (ref 6.0–8.3)

## 2018-03-09 LAB — LIPID PANEL
Cholesterol: 267 mg/dL — ABNORMAL HIGH (ref 0–200)
HDL: 57.3 mg/dL
NonHDL: 209.24
Total CHOL/HDL Ratio: 5
Triglycerides: 258 mg/dL — ABNORMAL HIGH (ref 0.0–149.0)
VLDL: 51.6 mg/dL — ABNORMAL HIGH (ref 0.0–40.0)

## 2018-03-09 LAB — LDL CHOLESTEROL, DIRECT: LDL DIRECT: 198 mg/dL

## 2018-03-09 LAB — HEMOGLOBIN A1C: Hgb A1c MFr Bld: 7.4 % — ABNORMAL HIGH (ref 4.6–6.5)

## 2018-03-09 LAB — TSH: TSH: 1.65 u[IU]/mL (ref 0.35–4.50)

## 2018-03-09 NOTE — Assessment & Plan Note (Signed)

## 2018-03-09 NOTE — Progress Notes (Signed)
Subjective:    Patient ID: Laura [name], female    DOB: 02-06-1945, 73 y.o.   MRN: [id number]  HPI  Here for wellness and f/u;  Overall doing ok;  Pt denies Chest pain, worsening SOB, DOE, wheezing, orthopnea, PND, worsening LE edema, palpitations, dizziness or syncope.  Pt denies neurological change such as new headache, facial or extremity weakness.  Pt denies polydipsia, polyuria, or low sugar symptoms. Pt states overall good compliance with treatment and medications, good tolerability, and has been trying to follow appropriate diet.  Pt denies worsening depressive symptoms, suicidal ideation or panic. No fever, night sweats, wt loss, loss of appetite, or other constitutional symptoms.  Pt states good ability with ADL's, has low fall risk, home safety reviewed and adequate, no other significant changes in hearing or vision, and only occasionally active with exercise.  Difficulty sleeping due to neck pain, better with tylenol, followed per Dr Eudelia Bunch. S/p cortisone to right [place] now better; has left frozen shoulder now s/p PT .  Has OAB itwh nocturia x 2 and sometimes has a midnight snacking, fairly often in the past yr, sometimes even ice cream.  CBG's have been under 160 overall, usually 130s in the AM.   Still having occasional constipation, has seen GI for this in past. Denies worsening reflux except for rare breakthrough at night, abd pain, dysphagia, n/v, or blood.  Not using the CPAP at night Past Medical History:  Diagnosis Date  . Anxiety   . Arthritis    back of neck, bones spurs on neck  . Cataract   . Cervical disc disease   . Diabetes mellitus   . Hyperlipidemia   . Hypertension   . Mucoid cyst of joint    right thumb  . Neuropathy   . Reflux   . Sleep apnea    wears CPAP nightly  . Vertigo    Past Surgical History:  Procedure Laterality Date  . BLADDER SURGERY    . BREAST EXCISIONAL BIOPSY Bilateral   . BREAST SURGERY    . CHOLECYSTECTOMY    . fibroids removed      breast (both breasts)  . MASS EXCISION Right 06/26/2016   Procedure: EXCISION MUCOID TUMOR RIGHT THUMB, IP RIGHT THUMB;  Surgeon: Daryll Brod, MD;  Location: [facility];  Service: Orthopedics;  Laterality: Right;  . PARTIAL HYSTERECTOMY      reports that she has never smoked. She has never used smokeless tobacco. She reports that she does not drink alcohol or use drugs. family history includes Diabetes in her father, maternal aunt, and sister; Hypertension in an other family member. Allergies  Allergen Reactions  . Hydrocodone-Homatropine Other (See Comments)    Vertigo *pt strongly prefers to never take*  . Sulfa Antibiotics     Tongue swells, hives, itching  . Augmentin [Amoxicillin-Pot Clavulanate]     Diarrhea; can take PCN/ Amoxicillin  . Codeine     itch  . Crestor [Rosuvastatin Calcium] Other (See Comments)    Did something to memory   . Doxycycline   . Keflex [Cephalexin] Diarrhea and Nausea And Vomiting  . Lipitor [Atorvastatin Calcium] Other (See Comments)    Makes weak   . Naproxen Other (See Comments)    Stomach cramps  . Prednisone Other (See Comments)    Nervous *pt strongly prefers to never be given prednisone*    Current Outpatient Medications on File Prior to Visit  Medication Sig Dispense Refill  . acetaminophen (TYLENOL) 325 MG  tablet Take 325 mg by mouth as needed for mild pain, moderate pain, fever or headache.     . Alum & Mag Hydroxide-Simeth (MYLANTA PO) Take by mouth.    Marland Kitchen amLODipine (NORVASC) 5 MG tablet TAKE ONE (1) TABLET BY MOUTH EVERY DAY 90 tablet 3  . Blood Glucose Monitoring Suppl (ONE TOUCH ULTRA 2) w/Device KIT Use as directed 1 each 0  . glimepiride (AMARYL) 2 MG tablet TAKE ONE-HALF TABLET BY MOUTH ONCE DAILYBEFORE BREAKFAST 45 tablet 3  . glucose blood (ONE TOUCH ULTRA TEST) test strip Use to check blood sugars twice a day 100 each 3  . Lancets (ONETOUCH ULTRASOFT) lancets USE TO CHECK BLOOD SUGAR TWICE A DAY 100 each 3    . loperamide (IMODIUM) 2 MG capsule Take by mouth as needed for diarrhea or loose stools.    Marland Kitchen LORazepam (ATIVAN) 0.5 MG tablet Take 1 tablet (0.5 mg total) by mouth 2 (two) times daily as needed for anxiety. 60 tablet 1  . Psyllium (METAMUCIL FIBER PO) Take by mouth.     No current facility-administered medications on file prior to visit.    Review of Systems Constitutional: Negative for other unusual diaphoresis, sweats, appetite or weight changes HENT: Negative for other worsening hearing loss, ear pain, facial swelling, mouth sores or neck stiffness.   Eyes: Negative for other worsening pain, redness or other visual disturbance.  Respiratory: Negative for other stridor or swelling Cardiovascular: Negative for other palpitations or other chest pain  Gastrointestinal: Negative for worsening diarrhea or loose stools, blood in stool, distention or other pain Genitourinary: Negative for hematuria, flank pain or other change in urine volume.  Musculoskeletal: Negative for myalgias or other joint swelling.  Skin: Negative for other color change, or other wound or worsening drainage.  Neurological: Negative for other syncope or numbness. Hematological: Negative for other adenopathy or swelling Psychiatric/Behavioral: Negative for hallucinations, other worsening agitation, SI, self-injury, or new decreased concentration All other system neg per pt    Objective:   Physical Exam BP 136/76   Pulse 87   Temp 97.9 F (36.6 C) (Oral)   Ht 5' 4" (1.626 m)   Wt 200 lb (90.7 kg)   SpO2 98%   BMI 34.33 kg/m  VS noted, not ill appearing Constitutional: Pt is oriented to person, place, and time. Appears well-developed and well-nourished, in no significant distress and comfortable Head: Normocephalic and atraumatic  Eyes: Conjunctivae and EOM are normal. Pupils are equal, round, and reactive to light Right Ear: External ear normal without discharge Left Ear: External ear normal without  discharge Nose: Nose without discharge or deformity Mouth/Throat: Oropharynx is without other ulcerations and moist  Neck: Normal range of motion. Neck supple. No JVD present. No tracheal deviation present or significant neck LA or mass Cardiovascular: Normal rate, regular rhythm, normal heart sounds and intact distal pulses.   Pulmonary/Chest: WOB normal and breath sounds without rales or wheezing  Abdominal: Soft. Bowel sounds are normal. NT. No HSM  Musculoskeletal: Normal range of motion. Exhibits no edema Lymphadenopathy: Has no other cervical adenopathy.  Neurological: Pt is alert and oriented to person, place, and time. Pt has normal reflexes. No cranial nerve deficit. Motor grossly intact, Gait intact Skin: Skin is warm and dry. No rash noted or new ulcerations Psychiatric:  Has normal mood and affect. Behavior is normal without agitation No other exam findings Lab Results  Component Value Date   WBC 7.9 08/25/2017   HGB 13.2 08/25/2017   HCT  39.1 08/25/2017   PLT 271.0 08/25/2017   GLUCOSE 134 (H) 08/25/2017   CHOL 243 (H) 08/25/2017   TRIG 234.0 (H) 08/25/2017   HDL 55.30 08/25/2017   LDLDIRECT 167.0 08/25/2017   LDLCALC 166 (H) 04/12/2014   ALT 13 08/25/2017   AST 15 08/25/2017   NA 140 08/25/2017   K 3.9 08/25/2017   CL 107 08/25/2017   CREATININE 0.76 08/25/2017   BUN 11 08/25/2017   CO2 29 08/25/2017   TSH 2.17 08/25/2017   HGBA1C 7.4 (H) 08/25/2017   MICROALBUR 1.0 08/25/2017      Assessment & Plan:

## 2018-03-09 NOTE — Assessment & Plan Note (Signed)
Encouraged restart CPAP, and f/u pulm as planned

## 2018-03-09 NOTE — Patient Instructions (Signed)
Please re-try the CPAP  Please continue all other medications as before, and refills have been done if requested.  Please have the pharmacy call with any other refills you may need.  Please continue your efforts at being more active, low cholesterol diet, and weight control.  You are otherwise up to date with prevention measures today.  Please keep your appointments with your specialists as you may have planned  Please go to the LAB in the Basement (turn left off the elevator) for the tests to be done today  You will be contacted by phone if any changes need to be made immediately.  Otherwise, you will receive a letter about your results with an explanation, but please check with MyChart first.  Please remember to sign up for MyChart if you have not done so, as this will be important to you in the future with finding out test results, communicating by private email, and scheduling acute appointments online when needed.  Please return in 6 months, or sooner if needed, with Lab testing done 3-5 days before\

## 2018-03-09 NOTE — Assessment & Plan Note (Signed)
stable overall by history and exam, recent data reviewed with pt, and pt to continue medical treatment as before,  to f/u any worsening symptoms or concerns  

## 2018-03-29 ENCOUNTER — Other Ambulatory Visit: Payer: Self-pay | Admitting: Internal Medicine

## 2018-05-04 ENCOUNTER — Encounter: Payer: Self-pay | Admitting: Internal Medicine

## 2018-05-04 MED ORDER — GLIMEPIRIDE 2 MG PO TABS: ORAL_TABLET | ORAL | 3 refills | Status: DC

## 2018-05-04 NOTE — Telephone Encounter (Signed)
[place] for staff to let pt know  We have increased the glimeparide to 2 mg and I sent a new prescriptoin

## 2018-06-14 ENCOUNTER — Other Ambulatory Visit: Payer: Self-pay | Admitting: Internal Medicine

## 2018-06-14 MED FILL — ONE TOUCH ULTRA TEST STRIPS: 50 days supply | Qty: 100 | Fill #0

## 2018-06-14 MED FILL — ONE TOUCH ULTRASOFT LANCETS: 50 days supply | Qty: 100 | Fill #0

## 2018-06-14 MED FILL — GLIMEPIRIDE 2 MG TABLET: 2 | 90 days supply | Qty: 45 | Fill #0

## 2018-06-14 MED FILL — AMLODIPINE BESYLATE 5 MG TA: 5 | 90 days supply | Qty: 90 | Fill #0

## 2018-08-25 ENCOUNTER — Other Ambulatory Visit: Payer: Self-pay

## 2018-08-25 ENCOUNTER — Ambulatory Visit (INDEPENDENT_AMBULATORY_CARE_PROVIDER_SITE_OTHER): Payer: Medicare Other | Admitting: Internal Medicine

## 2018-08-25 ENCOUNTER — Other Ambulatory Visit (INDEPENDENT_AMBULATORY_CARE_PROVIDER_SITE_OTHER): Payer: Medicare Other

## 2018-08-25 ENCOUNTER — Other Ambulatory Visit: Payer: Self-pay | Admitting: Internal Medicine

## 2018-08-25 ENCOUNTER — Encounter: Payer: Self-pay | Admitting: Internal Medicine

## 2018-08-25 VITALS — BP 154/96 | HR 95 | Temp 98.3°F | Ht 64.0 in | Wt 198.0 lb

## 2018-08-25 DIAGNOSIS — E1140 Type 2 diabetes mellitus with diabetic neuropathy, unspecified: Secondary | ICD-10-CM | POA: Diagnosis not present

## 2018-08-25 DIAGNOSIS — E538 Deficiency of other specified B group vitamins: Secondary | ICD-10-CM

## 2018-08-25 DIAGNOSIS — E559 Vitamin D deficiency, unspecified: Secondary | ICD-10-CM

## 2018-08-25 DIAGNOSIS — G4733 Obstructive sleep apnea (adult) (pediatric): Secondary | ICD-10-CM | POA: Diagnosis not present

## 2018-08-25 DIAGNOSIS — E785 Hyperlipidemia, unspecified: Secondary | ICD-10-CM | POA: Diagnosis not present

## 2018-08-25 DIAGNOSIS — E611 Iron deficiency: Secondary | ICD-10-CM

## 2018-08-25 DIAGNOSIS — Z0000 Encounter for general adult medical examination without abnormal findings: Secondary | ICD-10-CM

## 2018-08-25 DIAGNOSIS — F418 Other specified anxiety disorders: Secondary | ICD-10-CM | POA: Diagnosis not present

## 2018-08-25 DIAGNOSIS — I10 Essential (primary) hypertension: Secondary | ICD-10-CM | POA: Diagnosis not present

## 2018-08-25 DIAGNOSIS — G4700 Insomnia, unspecified: Secondary | ICD-10-CM

## 2018-08-25 LAB — HEPATIC FUNCTION PANEL
ALT: 16 U/L (ref 0–35)
AST: 16 U/L (ref 0–37)
Albumin: 4.8 g/dL (ref 3.5–5.2)
Alkaline Phosphatase: 81 U/L (ref 39–117)
Bilirubin, Direct: 0.1 mg/dL (ref 0.0–0.3)
Total Bilirubin: 0.3 mg/dL (ref 0.2–1.2)
Total Protein: 7.7 g/dL (ref 6.0–8.3)

## 2018-08-25 LAB — IBC PANEL
Iron: 66 ug/dL (ref 42–145)
Saturation Ratios: 17.9 % — ABNORMAL LOW (ref 20.0–50.0)
Transferrin: 264 mg/dL (ref 212.0–360.0)

## 2018-08-25 LAB — BASIC METABOLIC PANEL
BUN: 13 mg/dL (ref 6–23)
CO2: 25 mEq/L (ref 19–32)
Calcium: 10.2 mg/dL (ref 8.4–10.5)
Chloride: 106 mEq/L (ref 96–112)
Creatinine, Ser: 0.9 mg/dL (ref 0.40–1.20)
GFR: 74.11 mL/min (ref >60.00)
Glucose, Bld: 111 mg/dL — ABNORMAL HIGH (ref 70–99)
Potassium: 4.1 mEq/L (ref 3.5–5.1)
Sodium: 140 mEq/L (ref 135–145)

## 2018-08-25 LAB — LIPID PANEL
Cholesterol: 281 mg/dL — ABNORMAL HIGH (ref 0–200)
HDL: 52.2 mg/dL (ref >39.00)
NonHDL: 229.1
Total CHOL/HDL Ratio: 5
Triglycerides: 238 mg/dL — ABNORMAL HIGH (ref 0.0–149.0)
VLDL: 47.6 mg/dL — ABNORMAL HIGH (ref 0.0–40.0)

## 2018-08-25 LAB — LDL CHOLESTEROL, DIRECT: Direct LDL: 207 mg/dL

## 2018-08-25 LAB — HEMOGLOBIN A1C: Hgb A1c MFr Bld: 7.3 % — ABNORMAL HIGH (ref 4.6–6.5)

## 2018-08-25 LAB — VITAMIN B12: Vitamin B-12: 475 pg/mL (ref 211–911)

## 2018-08-25 LAB — VITAMIN D 25 HYDROXY (VIT D DEFICIENCY, FRACTURES): VITD: 19.94 ng/mL — ABNORMAL LOW (ref 30.00–100.00)

## 2018-08-25 MED ORDER — GLIMEPIRIDE 2 MG PO TABS: ORAL_TABLET | ORAL | 3 refills | Status: DC

## 2018-08-25 MED ORDER — BENZONATATE 100 MG PO CAPS: ORAL_CAPSULE | ORAL | 2 refills | Status: DC

## 2018-08-25 MED ORDER — ESZOPICLONE 2 MG PO TABS: 2.0000 mg | ORAL_TABLET | Freq: Every evening | ORAL | 1 refills | Status: DC | PRN

## 2018-08-25 MED ORDER — VITAMIN D (ERGOCALCIFEROL) 1.25 MG (50000 UNIT) PO CAPS: 50000.0000 [IU] | ORAL_CAPSULE | ORAL | 0 refills | Status: DC

## 2018-08-25 NOTE — Progress Notes (Signed)
Subjective:    Patient ID: Laura [name], female    DOB: May 21, 1945, 73 y.o.   MRN: [id number]  HPI  Here to f/u; overall doing ok,  Pt denies chest pain, increasing sob or doe, wheezing, orthopnea, PND, increased LE swelling, palpitations, dizziness or syncope.  Pt denies new neurological symptoms such as new headache, or facial or extremity weakness or numbness.  Pt denies polydipsia, polyuria, or low sugar episode.  Pt states overall good compliance with meds, mostly trying to follow appropriate diet, with wt overall stable,  but little exercise however.   Chronic cough persists, asks for refill tessalon perles. CBGs have been high 100's to low 200s despite trying to work with diet. Has not seen DM education but now willing.  Also unable to get to sleep most nights and stay asleep, OTC preps not working such as melatonin, ongoing for several months. Denies worsening depressive symptoms, suicidal ideation, or panic.   Past Medical History:  Diagnosis Date  . Anxiety   . Arthritis    back of neck, bones spurs on neck  . Cataract   . Cervical disc disease   . Diabetes mellitus   . Hyperlipidemia   . Hypertension   . Mucoid cyst of joint    right thumb  . Neuropathy   . Reflux   . Sleep apnea    wears CPAP nightly  . Vertigo    Past Surgical History:  Procedure Laterality Date  . BLADDER SURGERY    . BREAST EXCISIONAL BIOPSY Bilateral   . BREAST SURGERY    . CHOLECYSTECTOMY    . fibroids removed     breast (both breasts)  . MASS EXCISION Right 06/26/2016   Procedure: EXCISION MUCOID TUMOR RIGHT THUMB, IP RIGHT THUMB;  Surgeon: Daryll Brod, MD;  Location: [facility];  Service: Orthopedics;  Laterality: Right;  . PARTIAL HYSTERECTOMY      reports that she has never smoked. She has never used smokeless tobacco. She reports that she does not drink alcohol or use drugs. family history includes Diabetes in her father, maternal aunt, and sister; Hypertension in an other  family member. Allergies  Allergen Reactions  . Hydrocodone-Homatropine Other (See Comments)    Vertigo *pt strongly prefers to never take*  . Sulfa Antibiotics     Tongue swells, hives, itching  . Augmentin [Amoxicillin-Pot Clavulanate]     Diarrhea; can take PCN/ Amoxicillin  . Codeine     itch  . Crestor [Rosuvastatin Calcium] Other (See Comments)    Did something to memory   . Doxycycline   . Keflex [Cephalexin] Diarrhea and Nausea And Vomiting  . Lipitor [Atorvastatin Calcium] Other (See Comments)    Makes weak   . Naproxen Other (See Comments)    Stomach cramps  . Prednisone Other (See Comments)    Nervous *pt strongly prefers to never be given prednisone*    Current Outpatient Medications on File Prior to Visit  Medication Sig Dispense Refill  . acetaminophen (TYLENOL) 325 MG tablet Take 325 mg by mouth as needed for mild pain, moderate pain, fever or headache.     Marland Kitchen amLODipine (NORVASC) 5 MG tablet TAKE ONE (1) TABLET BY MOUTH EVERY DAY 90 tablet 3  . Blood Glucose Monitoring Suppl (ONE TOUCH ULTRA 2) w/Device KIT Use as directed 1 each 0  . Lancets (ONETOUCH ULTRASOFT) lancets USE TO CHECK BLOOD SUGAR TWICE DAILY 100 each 3  . loperamide (IMODIUM) 2 MG capsule Take by mouth  as needed for diarrhea or loose stools.    Marland Kitchen LORazepam (ATIVAN) 0.5 MG tablet Take 1 tablet (0.5 mg total) by mouth 2 (two) times daily as needed for anxiety. 60 tablet 1  . ONETOUCH ULTRA test strip USE TO CHECK BLOOD SUGARS TWO TIMES DAILY 100 each 3  . Psyllium (METAMUCIL FIBER PO) Take by mouth.     No current facility-administered medications on file prior to visit.    Review of Systems  Constitutional: Negative for other unusual diaphoresis or sweats HENT: Negative for ear discharge or swelling Eyes: Negative for other worsening visual disturbances Respiratory: Negative for stridor or other swelling  Gastrointestinal: Negative for worsening distension or other blood Genitourinary:  Negative for retention or other urinary change Musculoskeletal: Negative for other MSK pain or swelling Skin: Negative for color change or other new lesions Neurological: Negative for worsening tremors and other numbness  Psychiatric/Behavioral: Negative for worsening agitation or other fatigue All other system neg per pt    Objective:   Physical Exam BP (!) 154/96   Pulse 95   Temp 98.3 F (36.8 C) (Oral)   Ht 5' 4"  (1.626 m)   Wt 198 lb (89.8 kg)   SpO2 97%   BMI 33.99 kg/m  VS noted,  Constitutional: Pt appears in NAD HENT: Head: NCAT.  Right Ear: External ear normal.  Left Ear: External ear normal.  Eyes: . Pupils are equal, round, and reactive to light. Conjunctivae and EOM are normal Nose: without d/c or deformity Neck: Neck supple. Gross normal ROM Cardiovascular: Normal rate and regular rhythm.   Pulmonary/Chest: Effort normal and breath sounds without rales or wheezing.  Neurological: Pt is alert. At baseline orientation, motor grossly intact Skin: Skin is warm. No rashes, other new lesions, no LE edema Psychiatric: Pt behavior is normal without agitation , mild nervous No other exam findings Lab Results  Component Value Date   WBC 8.9 03/09/2018   HGB 13.6 03/09/2018   HCT 40.5 03/09/2018   PLT 285.0 03/09/2018   GLUCOSE 111 (H) 08/25/2018   CHOL 281 (H) 08/25/2018   TRIG 238.0 (H) 08/25/2018   HDL 52.20 08/25/2018   LDLDIRECT 207.0 08/25/2018   LDLCALC 166 (H) 04/12/2014   ALT 16 08/25/2018   AST 16 08/25/2018   NA 140 08/25/2018   K 4.1 08/25/2018   CL 106 08/25/2018   CREATININE 0.90 08/25/2018   BUN 13 08/25/2018   CO2 25 08/25/2018   TSH 1.65 03/09/2018   HGBA1C 7.3 (H) 08/25/2018   MICROALBUR 0.7 03/09/2018        Assessment & Plan:

## 2018-08-25 NOTE — Patient Instructions (Addendum)
Please take all new medication as prescribed - the lunesta for sleep as needed  OK to increase the glimeparide to 2 mg per day (the whole pill)  You will be contacted regarding the referral for: Diabetes education (diet), and Pulmonary  Please continue all other medications as before, and refills have been done if requested.  Please have the pharmacy call with any other refills you may need.  Please continue your efforts at being more active, low cholesterol diet, and weight control.  Please keep your appointments with your specialists as you may have planned  Please go to the LAB in the Basement (turn left off the elevator) for the tests to be done today  You will be contacted by phone if any changes need to be made immediately.  Otherwise, you will receive a letter about your results with an explanation, but please check with MyChart first.  Please remember to sign up for MyChart if you have not done so, as this will be important to you in the future with finding out test results, communicating by private email, and scheduling acute appointments online when needed.  Please return in 6 months, or sooner if needed, with Lab testing done 3-5 days before

## 2018-08-27 ENCOUNTER — Telehealth: Payer: Self-pay

## 2018-08-27 NOTE — Telephone Encounter (Signed)
-----   Message from Biagio Borg, MD sent at 08/25/2018  9:30 PM EDT ----- Left message on MyChart, pt to cont same tx except  The test results show that your current treatment is OK, as the tests are stable, except the Vitamin D level is low.  Please take Vitamin D 50000 units weekly for 12 weeks, then plan to change to OTC Vitamin D3 at 2000 units per day, indefinitely.    Shirron to please inform pt, I will do rx

## 2018-08-27 NOTE — Telephone Encounter (Signed)
Pt has viewed results via MyChart  

## 2018-08-28 ENCOUNTER — Encounter: Payer: Self-pay | Admitting: Internal Medicine

## 2018-08-28 NOTE — Assessment & Plan Note (Signed)
stable overall by history and exam, recent data reviewed with pt, and pt to continue medical treatment as before,  to f/u any worsening symptoms or concerns  

## 2018-08-28 NOTE — Assessment & Plan Note (Signed)
[place] for [facility] asd,  to f/u any worsening symptoms or concerns

## 2018-08-28 NOTE — Assessment & Plan Note (Signed)
For f/u pulm as better osa tx allows better BP control

## 2018-08-28 NOTE — Assessment & Plan Note (Signed)
Mild uncontrolled, for increased glimeparide 2 mg qd, refer DM education

## 2018-08-30 DIAGNOSIS — H5203 Hypermetropia, bilateral: Secondary | ICD-10-CM | POA: Diagnosis not present

## 2018-08-30 DIAGNOSIS — E119 Type 2 diabetes mellitus without complications: Secondary | ICD-10-CM | POA: Diagnosis not present

## 2018-08-31 ENCOUNTER — Ambulatory Visit: Payer: Medicare Other | Admitting: Psychiatry

## 2018-09-02 ENCOUNTER — Ambulatory Visit (INDEPENDENT_AMBULATORY_CARE_PROVIDER_SITE_OTHER): Payer: Medicare Other | Admitting: Psychiatry

## 2018-09-02 DIAGNOSIS — F330 Major depressive disorder, recurrent, mild: Secondary | ICD-10-CM | POA: Diagnosis not present

## 2018-09-09 ENCOUNTER — Other Ambulatory Visit: Payer: Self-pay

## 2018-09-09 ENCOUNTER — Encounter: Payer: Self-pay | Admitting: Registered"

## 2018-09-09 ENCOUNTER — Encounter: Payer: Medicare Other | Attending: Internal Medicine | Admitting: Registered"

## 2018-09-09 DIAGNOSIS — E1140 Type 2 diabetes mellitus with diabetic neuropathy, unspecified: Secondary | ICD-10-CM | POA: Insufficient documentation

## 2018-09-09 NOTE — Progress Notes (Signed)
Diabetes Self-Management Education  Visit Type:  First/Initial  Appt. Start Time: 1400 Appt. End Time: [id number]  [name], identified by name and date of birth, is a 73 y.o. female with a diagnosis of Diabetes: Type 2.   ASSESSMENT  Weight 197 lb 14.4 oz (89.8 kg). Body mass index is 33.97 kg/m.   Pt reports having GI issues. Reports sometimes having constipation and other times diarrhea. Reports this makes life difficult. Pt reports primary concern for appointment is help with diet to regulate GI symptoms and cravings. Reports she has cravings at night. Does not eat regular meals. Reports she eats bacon, egg, and cheese from [employer] sometimes. Reports she really wants to get GI back on track. Also reports she wants to lose weight. Pt has been to a GI specialist. Pt has been dx with IBS. Reports they put her on Miralax and fiber supplement. Reports fiber helps her to have a bowel movement but it's still hard. Reports having flatulence problems as well.  Pt reports she feels her diabetes developed d/t being on prednisone in the past. Reports she gained a lot of weight after being on the steroid in the past. Pt reports having reactions to many medications. Reports being very happy with her pharmacist downstairs.   Pt reports she checks blood sugar sporadically but not every day. Reports it is usually the same. Reports she is going to start checking more often if that is recommended, otherwise will only check a few times a week. Reports she can tell if blood sugar is very elevated or low, however, after discussing typical signs of hypo- and hyperglycemia she reports being unsure which she was feeling. Reports recently fasting blood sugar has been ~160-165. Reports 2 instances of having symptoms of low blood sugar. Reports she did not check it but felt jittery, hot, shaky, and weak.   Reports losing some appetite once she starts eating a meal and usually only eating half of meals. Reports when  she is out she forgets to eat and does not get hungry. Pt reports she is lactose intolerant. Drinks Lactaid milk.   Pt reports feeling stressed and that her GI problems could be worsened by her stress. Reports she has seen a Social worker. Reports she feels doing more physical activity when feeling stressed would help her.    Diabetes Self-Management Education - 09/09/18 1424      Health Coping   How would you rate your overall health?  Good      Psychosocial Assessment   Patient Belief/Attitude about Diabetes  --   Pt reports "Hate it"   Self-care barriers  Unable to determine    Self-management support  Doctor's office    Patient Concerns  Nutrition/Meal planning   Pt wants help with cravings and GI issues.   Special Needs  Unable to determine    Preferred Learning Style  No preference indicated    Learning Readiness  Ready      Pre-Education Assessment   Patient understands the diabetes disease and treatment process.  Needs Instruction    Patient understands incorporating nutritional management into lifestyle.  Needs Instruction    Patient undertands incorporating physical activity into lifestyle.  Needs Instruction    Patient understands using medications safely.  Demonstrates understanding / competency    Patient understands monitoring blood glucose, interpreting and using results  Needs Instruction    Patient understands prevention, detection, and treatment of acute complications.  Needs Instruction    Patient understands prevention, detection,  and treatment of chronic complications.  Needs Instruction    Patient understands how to develop strategies to address psychosocial issues.  Needs Instruction    Patient understands how to develop strategies to promote health/change behavior.  Needs Instruction      Complications   Last HgB A1C per patient/outside source  7.3 %    How often do you check your blood sugar?  0 times/day (not testing)   Pt reports checking sporadically    Fasting Blood glucose range (mg/dL)  130-179    Number of hypoglycemic episodes per month  2    Can you tell when your blood sugar is low?  Yes    What do you do if your blood sugar is low?  eats some Nabs, hard candy, or drinks soda    Number of hyperglycemic episodes per week  0    Have you had a dilated eye exam in the past 12 months?  Yes    Have you had a dental exam in the past 12 months?  No    Are you checking your feet?  Yes    How many days per week are you checking your feet?  7      Dietary Intake   Breakfast  omelet with 2 eggs, 1 slice cheese, green peppers, red onions, 2 slices bacon, 1/2 bagel with cream cheese, 1 cup hazelnut coffee (23 g CHO per serving)    Snack (afternoon)  3-4 apple slices    Snack (evening)  630-7 PM: Activia yogurt; sugar free Jello    Beverage(s)  3-4 x 16 oz (48-64 oz) water; hazelnut coffee (less than 1 cup)      Exercise   Exercise Type  ADL's    How many days per week to you exercise?  0    How many minutes per day do you exercise?  0    Total minutes per week of exercise  0      Patient Education   Previous Diabetes Education  No    Disease state   Definition of diabetes, type 1 and 2, and the diagnosis of diabetes;Factors that contribute to the development of diabetes    Nutrition management   Carbohydrate counting;Reviewed blood glucose goals for pre and post meals and how to evaluate the patients' food intake on their blood glucose level.;Role of diet in the treatment of diabetes and the relationship between the three main macronutrients and blood glucose level;Food label reading, portion sizes and measuring food.    Physical activity and exercise   Role of exercise on diabetes management, blood pressure control and cardiac health.    Monitoring  Purpose and frequency of SMBG.;Taught/discussed recording of test results and interpretation of SMBG.;Interpreting lab values - A1C, lipid, urine microalbumina.;Identified appropriate SMBG and/or  A1C goals.;Daily foot exams;Yearly dilated eye exam    Acute complications  Taught treatment of hypoglycemia - the 15 rule.;Discussed and identified patients' treatment of hyperglycemia.    Chronic complications  Relationship between chronic complications and blood glucose control;Lipid levels, blood glucose control and heart disease;Dental care;Assessed and discussed foot care and prevention of foot problems;Retinopathy and reason for yearly dilated eye exams;Nephropathy, what it is, prevention of, the use of ACE, ARB's and early detection of through urine microalbumia.;Reviewed with patient heart disease, higher risk of, and prevention      Individualized Goals (developed by patient)   Nutrition  Follow meal plan discussed;General guidelines for healthy choices and portions discussed    Physical Activity  Exercise 1-2 times per week;Exercise 3-5 times per week    Medications  take my medication as prescribed    Monitoring   test my blood glucose as discussed      Post-Education Assessment   Patient understands the diabetes disease and treatment process.  Demonstrates understanding / competency    Patient understands incorporating nutritional management into lifestyle.  Demonstrates understanding / competency    Patient undertands incorporating physical activity into lifestyle.  Demonstrates understanding / competency    Patient understands using medications safely.  Demonstrates understanding / competency    Patient understands monitoring blood glucose, interpreting and using results  Demonstrates understanding / competency    Patient understands prevention, detection, and treatment of acute complications.  Demonstrates understanding / competency    Patient understands prevention, detection, and treatment of chronic complications.  Demonstrates understanding / competency    Patient understands how to develop strategies to address psychosocial issues.  Demonstrates understanding / competency     Patient understands how to develop strategies to promote health/change behavior.  Demonstrates understanding / competency      Outcomes   Program Status  Completed       Learning Objective:  Patient will have a greater understanding of diabetes self-management. Patient education plan is to attend individual and/or group sessions per assessed needs and concerns.   Instructions/Goals:  Nutrition/Meals:  Have 3 meals per day/avoid going more than 5 hours without eating. This can help with blood sugar management and IBS  Recommend including 2-3 carbohydrates choices (30-45 g carbohydrates) per meal.   If having a snack, recommend 1 carbohydrate choice and 1 protein paired with it (see handout)   Blood Sugar Management:  Recommend checking blood sugar once in the morning before eating and drinking anything and one time 1-2 hours after a meal.   IBS:   Try to have a calm eating environment and include activities/hobbies that help reduce stress.   Recommend continuing with counseling.   Recommend tracking food and symptoms to help identify possible associations. Recommend also noting if you felt stressed that day.  Physical Activity:   Recommend gradually increasing physical activity. This can help with blood sugar and also help reduce stress.  Great ultimate goal = 30 minutes most days. Can gradually work up to this amount starting slow and steady    Patient Instructions  Instructions/Goals:  Nutrition/Meals:  Have 3 meals per day/avoid going more than 5 hours without eating. This can help with blood sugar management and IBS  Recommend including 2-3 carbohydrates choices (30-45 g carbohydrates) per meal.   If having a snack, recommend 1 carbohydrate choice and 1 protein paired with it (see handout)   Blood Sugar Management:  Recommend checking blood sugar once in the morning before eating and drinking anything and one time 1-2 hours after a meal.   IBS:   Try to  have a calm eating environment and include activities/hobbies that help reduce stress.   Recommend continuing with counseling.   Recommend tracking food and symptoms to help identify possible associations. Recommend also noting if you felt stressed that day.  Physical Activity:   Recommend gradually increasing physical activity. This can help with blood sugar and also help reduce stress.  Great ultimate goal = 30 minutes most days. Can gradually work up to this amount starting slow and steady        Expected Outcomes:  Demonstrated interest in learning. Expect positive outcomes  Education material provided: ADA - How to Thrive:  A Guide for Your Journey with Diabetes, Meal plan card, My Plate and Snack sheet  If problems or questions, patient to contact team via:  Phone and Email  Future DSME appointment: - 4-6 wks

## 2018-09-09 NOTE — Patient Instructions (Signed)
Instructions/Goals:  Nutrition/Meals:  Have 3 meals per day/avoid going more than 5 hours without eating. This can help with blood sugar management and IBS  Recommend including 2-3 carbohydrates choices (30-45 g carbohydrates) per meal.   If having a snack, recommend 1 carbohydrate choice and 1 protein paired with it (see handout)   Blood Sugar Management:  Recommend checking blood sugar once in the morning before eating and drinking anything and one time 1-2 hours after a meal.   IBS:   Try to have a calm eating environment and include activities/hobbies that help reduce stress.   Recommend continuing with counseling.   Recommend tracking food and symptoms to help identify possible associations. Recommend also noting if you felt stressed that day.  Physical Activity:   Recommend gradually increasing physical activity. This can help with blood sugar and also help reduce stress.  Great ultimate goal = 30 minutes most days. Can gradually work up to this amount starting slow and steady

## 2018-09-21 ENCOUNTER — Other Ambulatory Visit: Payer: Self-pay | Admitting: Internal Medicine

## 2018-09-30 ENCOUNTER — Encounter: Payer: Self-pay | Admitting: Internal Medicine

## 2018-09-30 ENCOUNTER — Other Ambulatory Visit (INDEPENDENT_AMBULATORY_CARE_PROVIDER_SITE_OTHER): Payer: Self-pay | Admitting: Specialist

## 2018-09-30 ENCOUNTER — Ambulatory Visit (INDEPENDENT_AMBULATORY_CARE_PROVIDER_SITE_OTHER): Payer: Medicare Other | Admitting: Internal Medicine

## 2018-09-30 DIAGNOSIS — E1140 Type 2 diabetes mellitus with diabetic neuropathy, unspecified: Secondary | ICD-10-CM

## 2018-09-30 DIAGNOSIS — R197 Diarrhea, unspecified: Secondary | ICD-10-CM

## 2018-09-30 NOTE — Progress Notes (Signed)
Patient ID: Laura [name], female   DOB: 30-Oct-1945, 73 y.o.   MRN: [id number]  Phone visit  Cumulative time during 7-day interval 14 min, there was not an associated office visit for this concern within a 7 day period.  Verbal consent for services obtained from patient prior to services given.  Names of all persons present for services: Cathlean Cower, MD, patient  Chief complaint: DM and diarrhea  History, background, results pertinent:  Here to f/u; overall doing ok,  Pt denies chest pain, increasing sob or doe, wheezing, orthopnea, PND, increased LE swelling, palpitations, dizziness or syncope.  Pt denies new neurological symptoms such as new headache, or facial or extremity weakness or numbness.  Pt denies polydipsia, polyuria, or low sugar episode.  Pt states overall good compliance with meds, mostly trying to follow appropriate diet, with wt overall stable,  but little exercise however. CBG have been up to 200 recently  Denies worsening reflux, abd pain, dysphagia, n/v, bowel change or blood, but has had diarrhea watery and loose for last several days, without fever, chills, abd pain or blood.  Has actually been on and off for several yrs. Had an episode diarrhea with walking one day, cbg 283 when got home at noon.    Past Medical History:  Diagnosis Date  . Anxiety   . Arthritis    back of neck, bones spurs on neck  . Cataract   . Cervical disc disease   . Diabetes mellitus   . Hyperlipidemia   . Hypertension   . Mucoid cyst of joint    right thumb  . Neuropathy   . Reflux   . Sleep apnea    wears CPAP nightly  . Vertigo    No results found for this or any previous visit (from the past 48 hour(s)).  Lab Results  Component Value Date   WBC 8.9 03/09/2018   HGB 13.6 03/09/2018   HCT 40.5 03/09/2018   PLT 285.0 03/09/2018   GLUCOSE 111 (H) 08/25/2018   CHOL 281 (H) 08/25/2018   TRIG 238.0 (H) 08/25/2018   HDL 52.20 08/25/2018   LDLDIRECT 207.0 08/25/2018   LDLCALC 166 (H)  04/12/2014   ALT 16 08/25/2018   AST 16 08/25/2018   NA 140 08/25/2018   K 4.1 08/25/2018   CL 106 08/25/2018   CREATININE 0.90 08/25/2018   BUN 13 08/25/2018   CO2 25 08/25/2018   TSH 1.65 03/09/2018   HGBA1C 7.3 (H) 08/25/2018   MICROALBUR 0.7 03/09/2018    A/P/next steps:   DM - ok for increased glimeparide to 3 mg qd  Diarrhea - benign appearing, cont lomotil prn,  to f/u any worsening symptoms or concerns  Cathlean Cower MD

## 2018-09-30 NOTE — Patient Instructions (Signed)
Please take all new medication as prescribed - the lomotil if needed  OK to increase the glimeparide to 3 mg per day (one and 1/2 pills of the 2 mg pills)  Please continue all other medications as before, and refills have been done if requested.  Please have the pharmacy call with any other refills you may need.  Please continue your efforts at being more active, low cholesterol diet, and weight control.  Please keep your appointments with your specialists as you may have planned

## 2018-10-01 NOTE — Telephone Encounter (Signed)
Left VM advising patient that Rx for Tramadol was sent to her pharmacy.

## 2018-10-19 ENCOUNTER — Other Ambulatory Visit: Payer: Self-pay | Admitting: Internal Medicine

## 2018-10-19 NOTE — Telephone Encounter (Signed)
Done erx 

## 2018-11-09 ENCOUNTER — Encounter: Payer: Self-pay | Admitting: Internal Medicine

## 2018-11-09 MED ORDER — SOLIFENACIN SUCCINATE 5 MG PO TABS: 5.0000 mg | ORAL_TABLET | Freq: Every day | ORAL | 3 refills | Status: DC

## 2018-11-10 ENCOUNTER — Encounter (INDEPENDENT_AMBULATORY_CARE_PROVIDER_SITE_OTHER): Payer: Self-pay | Admitting: Specialist

## 2018-11-15 ENCOUNTER — Ambulatory Visit (INDEPENDENT_AMBULATORY_CARE_PROVIDER_SITE_OTHER): Payer: Medicare Other | Admitting: Family Medicine

## 2018-11-15 ENCOUNTER — Ambulatory Visit: Payer: Self-pay

## 2018-11-15 ENCOUNTER — Encounter: Payer: Self-pay | Admitting: Family Medicine

## 2018-11-15 ENCOUNTER — Other Ambulatory Visit: Payer: Self-pay

## 2018-11-15 DIAGNOSIS — R200 Anesthesia of skin: Secondary | ICD-10-CM

## 2018-11-15 DIAGNOSIS — M542 Cervicalgia: Secondary | ICD-10-CM | POA: Diagnosis not present

## 2018-11-15 DIAGNOSIS — M25512 Pain in left shoulder: Secondary | ICD-10-CM

## 2018-11-15 DIAGNOSIS — G8929 Other chronic pain: Secondary | ICD-10-CM

## 2018-11-15 DIAGNOSIS — R202 Paresthesia of skin: Secondary | ICD-10-CM

## 2018-11-15 NOTE — Progress Notes (Signed)
Office Visit Note   Patient: [name]           Date of Birth: 1945-06-27           MRN: [id number] Visit Date: 11/15/2018 Requested by: Biagio Borg, MD [place],  [place] [address] PCP: Biagio Borg, MD  Subjective: Chief Complaint  Patient presents with  . Left Shoulder - Pain    Achy pain in the shoulder (chronic, usually "an ache"). For 3 weeks now, she has had stinging pains in the shoulder, with shooting, "electrical" pain going down the arm to the hand.    HPI: She is here with left shoulder and arm pain.  Longstanding history of problems with her shoulders, has glenohumeral and AC joint arthritis in both.  Her left shoulder started hurting recently for the past few weeks with no injury.  Deep aching pain anterior and posterior, with tingling sensation down her arm into the hand.  Denies any neck pain.  Last year she had problems with her right shoulder that improved with cortisone injection.  She would like to try 1 today if possible.               ROS: No fevers/chills.  All other systems were reviewed and are negative.  Objective: Vital Signs: There were no vitals taken for this visit.  Physical Exam:  General:  Alert and oriented, in no acute distress. Pulm:  Breathing unlabored. Psy:  Normal mood, congruent affect. Skin:  No rash.  Left shoulder: Limited active range of motion with overhead reach and behind the back reach which is chronic for her.  She has palpable crepitus in the shoulder with active range of motion.  Isometric rotator cuff strength is still 5/5 and she has good strength with deltoid, biceps, triceps, wrist and intrinsic hand testing.  Good range of motion of the neck with negative Spurling's test.  Negative Tinel's over carpal tunnel, positive Phalen's.  No thenar atrophy.  Imaging: None today.  Previous x-rays reviewed show glenohumeral and AC joint arthropathy.  Assessment & Plan: 1.  Left shoulder pain, suspect due to  glenohumeral arthritis.  Cannot rule out cervical radiculopathy given the paresthesias she is having.  Could be CTS. -Discussed options with her and elected to proceed with glenohumeral injection.  Physical therapy referral as well.     Procedures:  Procedure: Ultrasound-guided left glenohumeral injection: After sterile prep with Betadine, injected 8 cc 1% lidocaine without epinephrine and 40 mg methylprednisolone using a 22-gauge spinal needle, passing the needle through approach into the glenohumeral joint.  Injectate seen filling joint capsule.  Excellent immediate relief.       PMFS History: Patient Active Problem List   Diagnosis Date Noted  . Bilateral hearing loss 12/21/2017  . Eustachian tube disorder, bilateral 12/21/2017  . Cough 02/23/2017  . Allergic conjunctivitis 12/12/2016  . Left hand pain 05/26/2016  . Mucoid cyst, joint 05/26/2016  . Pain in finger of right hand 05/26/2016  . Primary osteoarthritis of both first carpometacarpal joints 05/26/2016  . Skin avulsion 05/26/2016  . Adhesive capsulitis of left shoulder 03/20/2016  . Fatigue 12/16/2015  . Acute bronchitis 10/31/2015  . Urinary frequency 03/09/2015  . Hearing loss, sensorineural, asymmetrical 11/09/2014  . Vestibular hypofunction, left 11/09/2014  . Morbid obesity ([place]) 07/25/2014  . Orthostatic hypotension 07/07/2014  . Upper airway cough syndrome 06/26/2014  . Diarrhea 05/02/2014  . Dizziness 04/12/2014  . Neuropathy   . Rash  and nonspecific skin eruption 08/30/2013  . Paresthesia of both feet 08/30/2013  . Blurred vision 02/15/2013  . Eye pain 02/15/2013  . Sinusitis, chronic 10/07/2012  . Left shoulder pain 04/15/2012  . Preventative health care 10/22/2011  . Cervical disc disease 10/22/2011  . Bilateral hand pain 08/05/2011  . SNHL (sensorineural hearing loss) 08/04/2011  . Fibrocystic breast disease 07/02/2011  . Hypertension 07/01/2011  . GERD (gastroesophageal reflux disease)  06/29/2011  . Bloating 06/29/2011  . Back pain 03/13/2011  . Vertigo 01/04/2011  . Nystagmus 01/04/2011  . Insomnia 10/21/2010  . CONSTIPATION 08/17/2009  . VITAMIN D DEFICIENCY 05/15/2009  . DYSPNEA 03/13/2009  . Depression with anxiety 01/16/2009  . Irritable bowel syndrome 01/16/2009  . Primary osteoarthritis of both hands 12/15/2008  . Diabetes ([place]) 11/03/2008  . Hyperlipidemia 11/03/2008  . Anxiety state 11/03/2008  . OVERACTIVE BLADDER 11/03/2008  . OSA (obstructive sleep apnea) 11/03/2008  . MURMUR 11/03/2008   Past Medical History:  Diagnosis Date  . Anxiety   . Arthritis    back of neck, bones spurs on neck  . Cataract   . Cervical disc disease   . Diabetes mellitus   . Hyperlipidemia   . Hypertension   . Mucoid cyst of joint    right thumb  . Neuropathy   . Reflux   . Sleep apnea    wears CPAP nightly  . Vertigo     Family History  Problem Relation Age of Onset  . Hypertension Other   . Diabetes Father   . Hypertension Father   . Diabetes Sister   . Hypertension Mother   . Hypertension Sister   . Stroke Sister   . Diabetes Maternal Aunt   . Colon cancer Neg Hx   . Esophageal cancer Neg Hx   . Stomach cancer Neg Hx   . Rectal cancer Neg Hx     Past Surgical History:  Procedure Laterality Date  . BLADDER SURGERY    . BREAST EXCISIONAL BIOPSY Bilateral   . BREAST SURGERY    . CHOLECYSTECTOMY    . fibroids removed     breast (both breasts)  . MASS EXCISION Right 06/26/2016   Procedure: EXCISION MUCOID TUMOR RIGHT THUMB, IP RIGHT THUMB;  Surgeon: Daryll Brod, MD;  Location: [facility];  Service: Orthopedics;  Laterality: Right;  . PARTIAL HYSTERECTOMY     Social History   Occupational History  . Occupation: retired Geographical information systems officer  Tobacco Use  . Smoking status: Never Smoker  . Smokeless tobacco: Never Used  Substance and Sexual Activity  . Alcohol use: No    Alcohol/week: 0.0 standard drinks  . Drug use: No  .  Sexual activity: Not Currently

## 2018-11-16 ENCOUNTER — Other Ambulatory Visit: Payer: Self-pay

## 2018-11-16 ENCOUNTER — Encounter: Payer: Self-pay | Admitting: Physical Therapy

## 2018-11-16 ENCOUNTER — Ambulatory Visit: Payer: Medicare Other | Attending: Family Medicine | Admitting: Physical Therapy

## 2018-11-16 DIAGNOSIS — M542 Cervicalgia: Secondary | ICD-10-CM

## 2018-11-16 DIAGNOSIS — G8929 Other chronic pain: Secondary | ICD-10-CM

## 2018-11-16 DIAGNOSIS — M25512 Pain in left shoulder: Secondary | ICD-10-CM | POA: Diagnosis not present

## 2018-11-16 NOTE — Therapy (Signed)
[facility] [place], [place], [address] Phone: [phone]   Fax:  [phone]  Physical Therapy Treatment  Patient Details  Name: Laura [name] MRN: [id number] Date of Birth: 1945-10-29 Referring Provider (PT): Eunice Blase MD   Encounter Date: 11/16/2018  PT End of Session - 11/16/18 1329    Visit Number  1    Number of Visits  12    Date for PT Re-Evaluation  02/14/19    Authorization Type  PROGRESS NOTE AT 10TH VISIT.  KX MODIFIER AFTER 15 VISITS.    PT Start Time  1126    PT Stop Time  1212    PT Time Calculation (min)  46 min    Activity Tolerance  Patient tolerated treatment well    Behavior During Therapy  WFL for tasks assessed/performed       Past Medical History:  Diagnosis Date  . Anxiety   . Arthritis    back of neck, bones spurs on neck  . Cataract   . Cervical disc disease   . Diabetes mellitus   . Hyperlipidemia   . Hypertension   . Mucoid cyst of joint    right thumb  . Neuropathy   . Reflux   . Sleep apnea    wears CPAP nightly  . Vertigo     Past Surgical History:  Procedure Laterality Date  . BLADDER SURGERY    . BREAST EXCISIONAL BIOPSY Bilateral   . BREAST SURGERY    . CHOLECYSTECTOMY    . fibroids removed     breast (both breasts)  . MASS EXCISION Right 06/26/2016   Procedure: EXCISION MUCOID TUMOR RIGHT THUMB, IP RIGHT THUMB;  Surgeon: Daryll Brod, MD;  Location: [place];  Service: Orthopedics;  Laterality: Right;  . PARTIAL HYSTERECTOMY      There were no vitals filed for this visit.  Subjective Assessment - 11/16/18 1328    Pertinent History  HTN, DM, cervical disc disease.         [facility] PT Assessment - 11/16/18 0001      Assessment   Medical Diagnosis  Chronic left shoulder and neck pain.    Referring Provider (PT)  Eunice Blase MD    Onset Date/Surgical Date  --   Ongoing.     Precautions   Precautions  None      Restrictions   Weight  Bearing Restrictions  No      Balance Screen   Has the patient fallen in the past 6 months  No    Has the patient had a decrease in activity level because of a fear of falling?   No    Is the patient reluctant to leave their home because of a fear of falling?   No      Home Environment   Living Environment  Private residence      Prior Function   Level of Independence  Independent      Posture/Postural Control   Posture/Postural Control  Postural limitations    Postural Limitations  Rounded Shoulders;Forward head      Deep Tendon Reflexes   DTR Assessment Site  --   Absent left Biceps DTR.     ROM / Strength   AROM / PROM / Strength  AROM;Strength      AROM   Overall AROM Comments  Left shoulder flexion= 100 degrees, ER= 45 degrees, left cervical rotation= 60 degrees and 70 degrees of right rotation.  Strength   Overall Strength Comments  Left shoulder IR/ER= 4-/5.      Palpation   Palpation comment  Tender to palpation left mid-cervical to left UT which has a great amount of tone.      Ambulation/Gait   Gait Comments  WNL.                   [facility] Adult PT Treatment/Exercise - 11/16/18 0001      Modalities   Modalities  Traction      Traction   Type of Traction  Cervical    Min (lbs)  5    Max (lbs)  12    Hold Time  99    Rest Time  5    Time  10                  PT Long Term Goals - 11/16/18 1330      PT LONG TERM GOAL #1   Title  Perform ADL's with pain not > 3/10.    Time  6    Period  Weeks    Status  New      PT LONG TERM GOAL #2   Title  Active left shoulder flexion to 125 degrees so the patient can easily reach overhead    Time  6    Period  Weeks    Status  New      PT LONG TERM GOAL #3   Title  Left shoulder ER to 65 degrees.    Time  6    Period  Weeks    Status  New      PT LONG TERM GOAL #4   Title  Reach behind back with left hand to L3.    Time  6    Period  Weeks    Status  New      PT LONG TERM  GOAL #5   Title  Left active cervical rotation to 70 degrees.    Time  6    Period  Weeks    Status  New            Plan - 11/16/18 1325    Clinical Impression Statement  The patient presents to OPPT with c/o left sided neck pain and tingling into her left hand.  She has significant limitation of left shoulder and some loss of cervical motion as well.  Her left Bicep reflex is absent.  She had an injection in her left shoulder yesterday and it was very helpful.  Patient will benefit from skilled physical therapy intervention to address deficits and pain.    Personal Factors and Comorbidities  Comorbidity 1;Comorbidity 2    Comorbidities  HTN, DM, cervical disc disease.    Examination-Activity Limitations  Reach Overhead;Other;Lift    Examination-Participation Restrictions  Other    Stability/Clinical Decision Making  Evolving/Moderate complexity    Clinical Decision Making  Low    Rehab Potential  Good    PT Frequency  2x / week    PT Duration  6 weeks    PT Treatment/Interventions  ADLs/Self Care Home Management;Cryotherapy;Electrical Stimulation;Ultrasound;Traction;Moist Heat;Therapeutic activities;Therapeutic exercise;Manual techniques;Patient/family education;Passive range of motion;Dry needling;Joint Manipulations    PT Next Visit Plan  Int cervical traction at 14#, chin tucks with cervical extension, STW/M, combo e'stim/U/S, range of motion to patient's left shoulder.    Consulted and Agree with Plan of Care  Patient       Patient will benefit from skilled therapeutic  intervention in order to improve the following deficits and impairments:  Pain, Postural dysfunction, Decreased activity tolerance, Decreased range of motion, Increased muscle spasms  Visit Diagnosis: Cervicalgia - Plan: PT plan of care cert/re-cert  Chronic left shoulder pain - Plan: PT plan of care cert/re-cert     Problem List Patient Active Problem List   Diagnosis Date Noted  . Bilateral hearing  loss 12/21/2017  . Eustachian tube disorder, bilateral 12/21/2017  . Cough 02/23/2017  . Allergic conjunctivitis 12/12/2016  . Left hand pain 05/26/2016  . Mucoid cyst, joint 05/26/2016  . Pain in finger of right hand 05/26/2016  . Primary osteoarthritis of both first carpometacarpal joints 05/26/2016  . Skin avulsion 05/26/2016  . Adhesive capsulitis of left shoulder 03/20/2016  . Fatigue 12/16/2015  . Acute bronchitis 10/31/2015  . Urinary frequency 03/09/2015  . Hearing loss, sensorineural, asymmetrical 11/09/2014  . Vestibular hypofunction, left 11/09/2014  . Morbid obesity ([place]) 07/25/2014  . Orthostatic hypotension 07/07/2014  . Upper airway cough syndrome 06/26/2014  . Diarrhea 05/02/2014  . Dizziness 04/12/2014  . Neuropathy   . Rash and nonspecific skin eruption 08/30/2013  . Paresthesia of both feet 08/30/2013  . Blurred vision 02/15/2013  . Eye pain 02/15/2013  . Sinusitis, chronic 10/07/2012  . Left shoulder pain 04/15/2012  . Preventative health care 10/22/2011  . Cervical disc disease 10/22/2011  . Bilateral hand pain 08/05/2011  . SNHL (sensorineural hearing loss) 08/04/2011  . Fibrocystic breast disease 07/02/2011  . Hypertension 07/01/2011  . GERD (gastroesophageal reflux disease) 06/29/2011  . Bloating 06/29/2011  . Back pain 03/13/2011  . Vertigo 01/04/2011  . Nystagmus 01/04/2011  . Insomnia 10/21/2010  . CONSTIPATION 08/17/2009  . VITAMIN D DEFICIENCY 05/15/2009  . DYSPNEA 03/13/2009  . Depression with anxiety 01/16/2009  . Irritable bowel syndrome 01/16/2009  . Primary osteoarthritis of both hands 12/15/2008  . Diabetes ([place]) 11/03/2008  . Hyperlipidemia 11/03/2008  . Anxiety state 11/03/2008  . OVERACTIVE BLADDER 11/03/2008  . OSA (obstructive sleep apnea) 11/03/2008  . MURMUR 11/03/2008    APPLEGATE, [place] MPT 11/16/2018, 2:16 PM  [facility] [address] [place], [place], [address] Phone:  [phone]   Fax:  [phone]  Name: Laura [name] MRN: [id number] Date of Birth: [birth year]

## 2018-11-18 ENCOUNTER — Ambulatory Visit: Payer: Medicare Other | Admitting: *Deleted

## 2018-11-19 ENCOUNTER — Encounter: Payer: Self-pay | Admitting: Physical Therapy

## 2018-11-19 ENCOUNTER — Other Ambulatory Visit: Payer: Self-pay

## 2018-11-19 ENCOUNTER — Ambulatory Visit: Payer: Medicare Other | Admitting: Physical Therapy

## 2018-11-19 DIAGNOSIS — G8929 Other chronic pain: Secondary | ICD-10-CM | POA: Diagnosis not present

## 2018-11-19 DIAGNOSIS — M542 Cervicalgia: Secondary | ICD-10-CM | POA: Diagnosis not present

## 2018-11-19 DIAGNOSIS — M25512 Pain in left shoulder: Secondary | ICD-10-CM | POA: Diagnosis not present

## 2018-11-19 NOTE — Therapy (Signed)
[facility] [place], [place], [address] Phone: [phone]   Fax:  [phone]  Physical Therapy Treatment  Patient Details  Name: Laura [name] MRN: [id number] Date of Birth: 01-17-1945 Referring Provider (PT): Eunice Blase MD   Encounter Date: 11/19/2018  PT End of Session - 11/19/18 0907    Visit Number  2    Number of Visits  12    Date for PT Re-Evaluation  02/14/19    Authorization Type  PROGRESS NOTE AT 10TH VISIT.  KX MODIFIER AFTER 15 VISITS.    PT Start Time  0907    PT Stop Time  0950    PT Time Calculation (min)  43 min    Activity Tolerance  Patient tolerated treatment well    Behavior During Therapy  [facility] for tasks assessed/performed       Past Medical History:  Diagnosis Date  . Anxiety   . Arthritis    back of neck, bones spurs on neck  . Cataract   . Cervical disc disease   . Diabetes mellitus   . Hyperlipidemia   . Hypertension   . Mucoid cyst of joint    right thumb  . Neuropathy   . Reflux   . Sleep apnea    wears CPAP nightly  . Vertigo     Past Surgical History:  Procedure Laterality Date  . BLADDER SURGERY    . BREAST EXCISIONAL BIOPSY Bilateral   . BREAST SURGERY    . CHOLECYSTECTOMY    . fibroids removed     breast (both breasts)  . MASS EXCISION Right 06/26/2016   Procedure: EXCISION MUCOID TUMOR RIGHT THUMB, IP RIGHT THUMB;  Surgeon: Daryll Brod, MD;  Location: [place];  Service: Orthopedics;  Laterality: Right;  . PARTIAL HYSTERECTOMY      There were no vitals filed for this visit.  Subjective Assessment - 11/19/18 0906    Subjective  COVID 19 screening performed on patient upon arrival. Patient reports traction and massage was planned in last treatment.    Pertinent History  HTN, DM, cervical disc disease.    Patient Stated Goals  Get out of pain.    Currently in Pain?  Yes    Pain Score  4     Pain Location  Neck    Pain Orientation  Left    Pain  Descriptors / Indicators  Aching;Tingling    Pain Type  Chronic pain    Pain Onset  More than a month ago    Pain Frequency  Constant         [facility] PT Assessment - 11/19/18 0001      Assessment   Medical Diagnosis  Chronic left shoulder and neck pain.    Referring Provider (PT)  Eunice Blase MD      Precautions   Precautions  None      Restrictions   Weight Bearing Restrictions  No                   [facility] Adult PT Treatment/Exercise - 11/19/18 0001      Modalities   Modalities  Ultrasound;Traction      Ultrasound   Ultrasound Location  L UT    Ultrasound Parameters  Combo 1.5 w/cm2, 100%, 1 mhz x10 min    Ultrasound Goals  Pain      Traction   Type of Traction  Cervical    Min (lbs)  5  Max (lbs)  15    Hold Time  99    Rest Time  5    Time  15      Manual Therapy   Manual Therapy  Soft tissue mobilization    Soft tissue mobilization  STW/TPR to L UT, cervical paraspinals to reduce muscle tightness and TPs                  PT Long Term Goals - 11/16/18 1330      PT LONG TERM GOAL #1   Title  Perform ADL's with pain not > 3/10.    Time  6    Period  Weeks    Status  New      PT LONG TERM GOAL #2   Title  Active left shoulder flexion to 125 degrees so the patient can easily reach overhead    Time  6    Period  Weeks    Status  New      PT LONG TERM GOAL #3   Title  Left shoulder ER to 65 degrees.    Time  6    Period  Weeks    Status  New      PT LONG TERM GOAL #4   Title  Reach behind back with left hand to L3.    Time  6    Period  Weeks    Status  New      PT LONG TERM GOAL #5   Title  Left active cervical rotation to 70 degrees.    Time  6    Period  Weeks    Status  New            Plan - 11/19/18 1002    Clinical Impression Statement  Patient presented in clinic with reports of intermittant tingling and pain in L cervical musculature that radiats down LUE. Muscle tightness and TPs notable in L UT and  cervical paraspinals region. No complaints of any excessive tenderness or pain during manual therapy session. Mechanical traction increased to 15# max with no complaints following end of treatment.    Personal Factors and Comorbidities  Comorbidity 1;Comorbidity 2    Comorbidities  HTN, DM, cervical disc disease.    Examination-Activity Limitations  Reach Overhead;Other;Lift    Examination-Participation Restrictions  Other    Stability/Clinical Decision Making  Evolving/Moderate complexity    Rehab Potential  Good    PT Frequency  2x / week    PT Duration  6 weeks    PT Treatment/Interventions  ADLs/Self Care Home Management;Cryotherapy;Electrical Stimulation;Ultrasound;Traction;Moist Heat;Therapeutic activities;Therapeutic exercise;Manual techniques;Patient/family education;Passive range of motion;Dry needling;Joint Manipulations    PT Next Visit Plan  Int cervical traction at 14#, chin tucks with cervical extension, STW/M, combo e'stim/U/S, range of motion to patient's left shoulder.    Consulted and Agree with Plan of Care  Patient       Patient will benefit from skilled therapeutic intervention in order to improve the following deficits and impairments:  Pain, Postural dysfunction, Decreased activity tolerance, Decreased range of motion, Increased muscle spasms  Visit Diagnosis: Cervicalgia  Chronic left shoulder pain     Problem List Patient Active Problem List   Diagnosis Date Noted  . Bilateral hearing loss 12/21/2017  . Eustachian tube disorder, bilateral 12/21/2017  . Cough 02/23/2017  . Allergic conjunctivitis 12/12/2016  . Left hand pain 05/26/2016  . Mucoid cyst, joint 05/26/2016  . Pain in finger of right hand 05/26/2016  . Primary osteoarthritis  of both first carpometacarpal joints 05/26/2016  . Skin avulsion 05/26/2016  . Adhesive capsulitis of left shoulder 03/20/2016  . Fatigue 12/16/2015  . Acute bronchitis 10/31/2015  . Urinary frequency 03/09/2015  .  Hearing loss, sensorineural, asymmetrical 11/09/2014  . Vestibular hypofunction, left 11/09/2014  . Morbid obesity ([place]) 07/25/2014  . Orthostatic hypotension 07/07/2014  . Upper airway cough syndrome 06/26/2014  . Diarrhea 05/02/2014  . Dizziness 04/12/2014  . Neuropathy   . Rash and nonspecific skin eruption 08/30/2013  . Paresthesia of both feet 08/30/2013  . Blurred vision 02/15/2013  . Eye pain 02/15/2013  . Sinusitis, chronic 10/07/2012  . Left shoulder pain 04/15/2012  . Preventative health care 10/22/2011  . Cervical disc disease 10/22/2011  . Bilateral hand pain 08/05/2011  . SNHL (sensorineural hearing loss) 08/04/2011  . Fibrocystic breast disease 07/02/2011  . Hypertension 07/01/2011  . GERD (gastroesophageal reflux disease) 06/29/2011  . Bloating 06/29/2011  . Back pain 03/13/2011  . Vertigo 01/04/2011  . Nystagmus 01/04/2011  . Insomnia 10/21/2010  . CONSTIPATION 08/17/2009  . VITAMIN D DEFICIENCY 05/15/2009  . DYSPNEA 03/13/2009  . Depression with anxiety 01/16/2009  . Irritable bowel syndrome 01/16/2009  . Primary osteoarthritis of both hands 12/15/2008  . Diabetes ([place]) 11/03/2008  . Hyperlipidemia 11/03/2008  . Anxiety state 11/03/2008  . OVERACTIVE BLADDER 11/03/2008  . OSA (obstructive sleep apnea) 11/03/2008  . MURMUR 11/03/2008    Standley Brooking, PTA 11/19/2018, 10:06 AM  [facility] [address] [place], [place], [address] Phone: [phone]   Fax:  [phone]  Name: Laura [name] MRN: [id number] Date of Birth: [birth year]

## 2018-11-22 ENCOUNTER — Other Ambulatory Visit: Payer: Self-pay | Admitting: Internal Medicine

## 2018-11-22 ENCOUNTER — Other Ambulatory Visit: Payer: Self-pay

## 2018-11-22 ENCOUNTER — Encounter: Payer: Self-pay | Admitting: Gastroenterology

## 2018-11-22 ENCOUNTER — Ambulatory Visit (INDEPENDENT_AMBULATORY_CARE_PROVIDER_SITE_OTHER): Payer: Medicare Other

## 2018-11-22 ENCOUNTER — Ambulatory Visit (INDEPENDENT_AMBULATORY_CARE_PROVIDER_SITE_OTHER): Payer: Medicare Other | Admitting: Gastroenterology

## 2018-11-22 VITALS — BP 160/90 | HR 82 | Temp 97.9°F | Ht 64.0 in | Wt 196.0 lb

## 2018-11-22 DIAGNOSIS — K582 Mixed irritable bowel syndrome: Secondary | ICD-10-CM

## 2018-11-22 DIAGNOSIS — Z23 Encounter for immunization: Secondary | ICD-10-CM

## 2018-11-22 MED ORDER — LINACLOTIDE 72 MCG PO CAPS: 72.0000 ug | ORAL_CAPSULE | Freq: Every day | ORAL | 3 refills | Status: DC

## 2018-11-22 NOTE — Progress Notes (Signed)
Review of pertinent gastrointestinal problems: 1. IBS Constipation/D: functional 2. Adenomatous polyp: 07/2011 Colonoscopy Dr. Ardis Hughs for constipation; single small TA (subCM), recommended recall colonoscopy at 5 years.  Colonoscopy 09/2016 was normal.  Recommended 10 year screening.   HPI: This is a very pleasant 73 year old woman Chief complaint is alternating constipation diarrhea  I last saw her about a year ago for classic alternating IBS.  I recommended she start taking a fiber supplement on a daily basis.  She was to call to report on her response in 4 weeks.  Today she tells me she has been taking fiber supplements 5 or 6 days a week.  She has had 3 episodes of fecal incontinence.  She tends to swing back quickly between constipation and significant diarrhea.  She has seen no bleeding.  Her weight has been overall stable     Review of systems: Pertinent positive and negative review of systems were noted in the above HPI section. All other review negative.   Past Medical History:  Diagnosis Date  . Anxiety   . Arthritis    back of neck, bones spurs on neck  . Cataract   . Cervical disc disease   . Diabetes mellitus   . Hyperlipidemia   . Hypertension   . Mucoid cyst of joint    right thumb  . Neuropathy   . Reflux   . Sleep apnea    wears CPAP nightly  . Vertigo     Past Surgical History:  Procedure Laterality Date  . BLADDER SURGERY    . BREAST EXCISIONAL BIOPSY Bilateral   . BREAST SURGERY    . CHOLECYSTECTOMY    . fibroids removed     breast (both breasts)  . MASS EXCISION Right 06/26/2016   Procedure: EXCISION MUCOID TUMOR RIGHT THUMB, IP RIGHT THUMB;  Surgeon: Daryll Brod, MD;  Location: [place];  Service: Orthopedics;  Laterality: Right;  . PARTIAL HYSTERECTOMY      Current Outpatient Medications  Medication Sig Dispense Refill  . acetaminophen (TYLENOL) 325 MG tablet Take 325 mg by mouth as needed for mild pain, moderate pain, fever  or headache.     Marland Kitchen amLODipine (NORVASC) 5 MG tablet TAKE ONE (1) TABLET BY MOUTH EVERY DAY 90 tablet 0  . Aspirin-Acetaminophen-Caffeine (GOODYS EXTRA STRENGTH) 520-260-32.5 MG PACK     . benzonatate (TESSALON PERLES) 100 MG capsule 1 tab by mouth three times per day as needed 40 capsule 2  . Blood Glucose Monitoring Suppl (ONE TOUCH ULTRA 2) w/Device KIT Use as directed 1 each 0  . Cyanocobalamin (VITAMIN B-12 PO) Take by mouth.    Marland Kitchen glimepiride (AMARYL) 2 MG tablet TAKE ONETABLET BY MOUTH DAILY 90 tablet 3  . Lancets (ONETOUCH ULTRASOFT) lancets USE TO CHECK BLOOD SUGAR TWICE DAILY 100 each 3  . loperamide (IMODIUM) 2 MG capsule Take by mouth as needed for diarrhea or loose stools.    Marland Kitchen LORazepam (ATIVAN) 0.5 MG tablet TAKE 1 TABLET BY MOUTH 2 TIMES DAILY AS NEEDED FOR ANXIETY 60 tablet 2  . ONETOUCH ULTRA test strip USE TO CHECK BLOOD SUGARS TWO TIMES DAILY 100 each 3  . Psyllium (METAMUCIL FIBER PO) Take by mouth.    . solifenacin (VESICARE) 5 MG tablet Take 1 tablet (5 mg total) by mouth daily. 90 tablet 3  . traMADol (ULTRAM) 50 MG tablet     . Vitamin D, Ergocalciferol, (DRISDOL) 1.25 MG (50000 UT) CAPS capsule Take 1 capsule (50,000 Units total) by  mouth every 7 (seven) days. 12 capsule 0   No current facility-administered medications for this visit.     Allergies as of 11/22/2018 - Review Complete 11/22/2018  Allergen Reaction Noted  . Hydrocodone-homatropine Other (See Comments) 04/19/2013  . Sulfa antibiotics  10/21/2010  . Augmentin [amoxicillin-pot clavulanate]  12/04/2017  . Codeine  10/21/2010  . Crestor [rosuvastatin calcium] Other (See Comments) 10/23/2010  . Doxycycline  03/10/2017  . Keflex [cephalexin] Diarrhea and Nausea And Vomiting 06/20/2015  . Lipitor [atorvastatin calcium] Other (See Comments) 10/23/2010  . Naproxen Other (See Comments) 06/23/2016  . Prednisone Other (See Comments) 10/21/2010    Family History  Problem Relation Age of Onset  . Hypertension  Other   . Diabetes Father   . Hypertension Father   . Diabetes Sister   . Hypertension Mother   . Hypertension Sister   . Stroke Sister   . Diabetes Maternal Aunt   . Colon cancer Neg Hx   . Esophageal cancer Neg Hx   . Stomach cancer Neg Hx   . Rectal cancer Neg Hx     Social History   Socioeconomic History  . Marital status: Divorced    Spouse name: Not on file  . Number of children: 4  . Years of education: 65  . Highest education level: Not on file  Occupational History  . Occupation: retired Geographical information systems officer  Social Needs  . Financial resource strain: Not on file  . Food insecurity    Worry: Not on file    Inability: Not on file  . Transportation needs    Medical: Not on file    Non-medical: Not on file  Tobacco Use  . Smoking status: Never Smoker  . Smokeless tobacco: Never Used  Substance and Sexual Activity  . Alcohol use: No    Alcohol/week: 0.0 standard drinks  . Drug use: No  . Sexual activity: Not Currently  Lifestyle  . Physical activity    Days per week: Not on file    Minutes per session: Not on file  . Stress: Not on file  Relationships  . Social Herbalist on phone: Not on file    Gets together: Not on file    Attends religious service: Not on file    Active member of club or organization: Not on file    Attends meetings of clubs or organizations: Not on file    Relationship status: Not on file  . Intimate partner violence    Fear of current or ex partner: Not on file    Emotionally abused: Not on file    Physically abused: Not on file    Forced sexual activity: Not on file  Other Topics Concern  . Not on file  Social History Narrative  . Not on file     Physical Exam: BP (!) 160/90   Pulse 82   Temp 97.9 F (36.6 C)   Ht 5' 4"  (1.626 m)   Wt 196 lb (88.9 kg)   BMI 33.64 kg/m  Constitutional: generally well-appearing Psychiatric: alert and oriented x3 Eyes: extraocular movements intact Mouth: oral pharynx  moist, no lesions Neck: supple no lymphadenopathy Cardiovascular: heart regular rate and rhythm Lungs: clear to auscultation bilaterally Abdomen: soft, nontender, nondistended, no obvious ascites, no peritoneal signs, normal bowel sounds Extremities: no lower extremity edema bilaterally Skin: no lesions on visible extremities   Assessment and plan: 73 y.o. female with alternating IBS  She has had 3 episodes of fecal  incontinence in the past few weeks.  These were following some constipation episodes.  I think she is really cycling pretty quickly between constipation and diarrhea.  I think her daily fiber supplements might be working against her at this point and then she chases with Imodium causes constipation eventually.  Going to have her stop taking the fiber supplements starting today and instead going to see if a  low-dose Linzess once daily can help her have a full bowel movements and keep her from getting constipated followed by explosive diarrhea.  She will return to see me in 4 to 6 weeks.  We will make that appointment for her now.    Please see the "Patient Instructions" section for addition details about the plan.   Owens Loffler, MD [facility] 11/22/2018, 2:29 PM  Cc: Biagio Borg, MD

## 2018-11-22 NOTE — Patient Instructions (Addendum)
If you are age 74 or older, your body mass index should be between 23-30. Your Body mass index is 33.64 kg/m. If this is out of the aforementioned range listed, please consider follow up with your Primary Care Provider.  If you are age 66 or younger, your body mass index should be between 19-25. Your Body mass index is 33.64 kg/m. If this is out of the aformentioned range listed, please consider follow up with your Primary Care Provider.   We have sent the following medications to your pharmacy for you to pick up at your convenience:  1. Start Linzess 72 mcg one capsule nightly.   2. Stop Fiber supplement.   Follow up 4-6 weeks.

## 2018-11-23 ENCOUNTER — Ambulatory Visit: Payer: Medicare Other | Admitting: Physical Therapy

## 2018-11-23 ENCOUNTER — Encounter: Payer: Self-pay | Admitting: Physical Therapy

## 2018-11-23 DIAGNOSIS — M25512 Pain in left shoulder: Secondary | ICD-10-CM | POA: Diagnosis not present

## 2018-11-23 DIAGNOSIS — M542 Cervicalgia: Secondary | ICD-10-CM | POA: Diagnosis not present

## 2018-11-23 DIAGNOSIS — G8929 Other chronic pain: Secondary | ICD-10-CM

## 2018-11-23 NOTE — Therapy (Signed)
[facility] [place], [place], [address] Phone: [phone]   Fax:  [phone]  Physical Therapy Treatment  Patient Details  Name: Laura [name] MRN: [id number] Date of Birth: 1945-10-26 Referring Provider (PT): Eunice Blase MD   Encounter Date: 11/23/2018  PT End of Session - 11/23/18 1042    Visit Number  3    Number of Visits  12    Date for PT Re-Evaluation  02/14/19    Authorization Type  PROGRESS NOTE AT 10TH VISIT.  KX MODIFIER AFTER 15 VISITS.    PT Start Time  1033    PT Stop Time  1120    PT Time Calculation (min)  47 min    Activity Tolerance  Patient tolerated treatment well    Behavior During Therapy  WFL for tasks assessed/performed       Past Medical History:  Diagnosis Date  . Anxiety   . Arthritis    back of neck, bones spurs on neck  . Cataract   . Cervical disc disease   . Diabetes mellitus   . Hyperlipidemia   . Hypertension   . Mucoid cyst of joint    right thumb  . Neuropathy   . Reflux   . Sleep apnea    wears CPAP nightly  . Vertigo     Past Surgical History:  Procedure Laterality Date  . BLADDER SURGERY    . BREAST EXCISIONAL BIOPSY Bilateral   . BREAST SURGERY    . CHOLECYSTECTOMY    . fibroids removed     breast (both breasts)  . MASS EXCISION Right 06/26/2016   Procedure: EXCISION MUCOID TUMOR RIGHT THUMB, IP RIGHT THUMB;  Surgeon: Daryll Brod, MD;  Location: [facility];  Service: Orthopedics;  Laterality: Right;  . PARTIAL HYSTERECTOMY      There were no vitals filed for this visit.  Subjective Assessment - 11/23/18 1035    Subjective  COVID 19 screening performed on patient upon arrival. Patient requests heat today as she experiences shooting pain from C7 region upwards. Patient reports never feeling that before.    Pertinent History  HTN, DM, cervical disc disease.    Patient Stated Goals  Get out of pain.    Currently in Pain?  Other (Comment)   No  definitive pain assessment provided        [facility] PT Assessment - 11/23/18 0001      Assessment   Medical Diagnosis  Chronic left shoulder and neck pain.    Referring Provider (PT)  Eunice Blase MD      Precautions   Precautions  None      Restrictions   Weight Bearing Restrictions  No                   [facility] Adult PT Treatment/Exercise - 11/23/18 0001      Modalities   Modalities  Electrical Stimulation;Moist Heat;Traction      Moist Heat Therapy   Number Minutes Moist Heat  15 Minutes    Moist Heat Location  Cervical      Electrical Stimulation   Electrical Stimulation Location  B UT/ C7    Electrical Stimulation Action  Pre-Mod    Electrical Stimulation Parameters  80-150 hz x15 min    Electrical Stimulation Goals  Pain      Traction   Type of Traction  Cervical    Min (lbs)  5    Max (lbs)  16  Hold Time  99    Rest Time  5    Time  15      Manual Therapy   Manual Therapy  Soft tissue mobilization    Soft tissue mobilization  STW/TPR to B UT/ cervical paraspinals region to reduce pain      Prosthetics   Prosthetic Care Comments                      PT Long Term Goals - 11/16/18 1330      PT LONG TERM GOAL #1   Title  Perform ADL's with pain not > 3/10.    Time  6    Period  Weeks    Status  New      PT LONG TERM GOAL #2   Title  Active left shoulder flexion to 125 degrees so the patient can easily reach overhead    Time  6    Period  Weeks    Status  New      PT LONG TERM GOAL #3   Title  Left shoulder ER to 65 degrees.    Time  6    Period  Weeks    Status  New      PT LONG TERM GOAL #4   Title  Reach behind back with left hand to L3.    Time  6    Period  Weeks    Status  New      PT LONG TERM GOAL #5   Title  Left active cervical rotation to 70 degrees.    Time  6    Period  Weeks    Status  New              Patient will benefit from skilled therapeutic intervention in order to improve the  following deficits and impairments:     Visit Diagnosis: Cervicalgia  Chronic left shoulder pain     Problem List Patient Active Problem List   Diagnosis Date Noted  . Bilateral hearing loss 12/21/2017  . Eustachian tube disorder, bilateral 12/21/2017  . Cough 02/23/2017  . Allergic conjunctivitis 12/12/2016  . Left hand pain 05/26/2016  . Mucoid cyst, joint 05/26/2016  . Pain in finger of right hand 05/26/2016  . Primary osteoarthritis of both first carpometacarpal joints 05/26/2016  . Skin avulsion 05/26/2016  . Adhesive capsulitis of left shoulder 03/20/2016  . Fatigue 12/16/2015  . Acute bronchitis 10/31/2015  . Urinary frequency 03/09/2015  . Hearing loss, sensorineural, asymmetrical 11/09/2014  . Vestibular hypofunction, left 11/09/2014  . Morbid obesity ([place]) 07/25/2014  . Orthostatic hypotension 07/07/2014  . Upper airway cough syndrome 06/26/2014  . Diarrhea 05/02/2014  . Dizziness 04/12/2014  . Neuropathy   . Rash and nonspecific skin eruption 08/30/2013  . Paresthesia of both feet 08/30/2013  . Blurred vision 02/15/2013  . Eye pain 02/15/2013  . Sinusitis, chronic 10/07/2012  . Left shoulder pain 04/15/2012  . Preventative health care 10/22/2011  . Cervical disc disease 10/22/2011  . Bilateral hand pain 08/05/2011  . SNHL (sensorineural hearing loss) 08/04/2011  . Fibrocystic breast disease 07/02/2011  . Hypertension 07/01/2011  . GERD (gastroesophageal reflux disease) 06/29/2011  . Bloating 06/29/2011  . Back pain 03/13/2011  . Vertigo 01/04/2011  . Nystagmus 01/04/2011  . Insomnia 10/21/2010  . CONSTIPATION 08/17/2009  . VITAMIN D DEFICIENCY 05/15/2009  . DYSPNEA 03/13/2009  . Depression with anxiety 01/16/2009  . Irritable bowel syndrome 01/16/2009  . Primary osteoarthritis of  both hands 12/15/2008  . Diabetes ([place]) 11/03/2008  . Hyperlipidemia 11/03/2008  . Anxiety state 11/03/2008  . OVERACTIVE BLADDER 11/03/2008  . OSA (obstructive sleep  apnea) 11/03/2008  . MURMUR 11/03/2008    Standley Brooking, PTA 11/23/2018, 11:28 AM  [facility] [address] [place], [place], [address] Phone: [phone]   Fax:  [phone]  Name: AMILIAN JOVIC MRN: [id number] Date of Birth: 11/21/1945

## 2018-11-26 ENCOUNTER — Other Ambulatory Visit: Payer: Self-pay

## 2018-11-26 ENCOUNTER — Encounter: Payer: Self-pay | Admitting: Physical Therapy

## 2018-11-26 ENCOUNTER — Ambulatory Visit: Payer: Medicare Other | Admitting: Physical Therapy

## 2018-11-26 DIAGNOSIS — G8929 Other chronic pain: Secondary | ICD-10-CM

## 2018-11-26 DIAGNOSIS — M542 Cervicalgia: Secondary | ICD-10-CM | POA: Diagnosis not present

## 2018-11-26 DIAGNOSIS — M25512 Pain in left shoulder: Secondary | ICD-10-CM | POA: Diagnosis not present

## 2018-11-26 NOTE — Therapy (Signed)
[facility] [place], [place], [address] Phone: [phone]   Fax:  [phone]  Physical Therapy Treatment  Patient Details  Name: Laura [name] MRN: [id number] Date of Birth: 27-May-1945 Referring Provider (PT): Eunice Blase MD   Encounter Date: 11/26/2018  PT End of Session - 11/26/18 1034    Visit Number  4    Number of Visits  12    Date for PT Re-Evaluation  02/14/19    Authorization Type  PROGRESS NOTE AT 10TH VISIT.  KX MODIFIER AFTER 15 VISITS.    PT Start Time  1034    PT Stop Time  1120    PT Time Calculation (min)  46 min    Activity Tolerance  Patient tolerated treatment well    Behavior During Therapy  WFL for tasks assessed/performed       Past Medical History:  Diagnosis Date  . Anxiety   . Arthritis    back of neck, bones spurs on neck  . Cataract   . Cervical disc disease   . Diabetes mellitus   . Hyperlipidemia   . Hypertension   . Mucoid cyst of joint    right thumb  . Neuropathy   . Reflux   . Sleep apnea    wears CPAP nightly  . Vertigo     Past Surgical History:  Procedure Laterality Date  . BLADDER SURGERY    . BREAST EXCISIONAL BIOPSY Bilateral   . BREAST SURGERY    . CHOLECYSTECTOMY    . fibroids removed     breast (both breasts)  . MASS EXCISION Right 06/26/2016   Procedure: EXCISION MUCOID TUMOR RIGHT THUMB, IP RIGHT THUMB;  Surgeon: Daryll Brod, MD;  Location: [facility];  Service: Orthopedics;  Laterality: Right;  . PARTIAL HYSTERECTOMY      There were no vitals filed for this visit.  Subjective Assessment - 11/26/18 1032    Subjective  COVID 19 screening performed on patient upon arrival. Patient reports she feels as her neck is longer now. Requests a little less weight on traction to reduce soreness. Still having tingling in LUE that is 50% better at this time. Less intense and less frequent.    Pertinent History  HTN, DM, cervical disc disease.    Patient Stated Goals  Get out of pain.    Currently in Pain?  Yes    Pain Score  2     Pain Location  Neck    Pain Orientation  Left    Pain Descriptors / Indicators  Discomfort    Pain Type  Chronic pain    Pain Onset  More than a month ago    Pain Frequency  Intermittent                       [facility] Adult PT Treatment/Exercise - 11/26/18 0001      Modalities   Modalities  Electrical Stimulation;Moist Heat;Traction      Ultrasound   Ultrasound Location  L UT     Ultrasound Parameters  combo 1.5 w/cm2, 100%, 1 mhz x10 min    Ultrasound Goals  Pain      Traction   Type of Traction  Cervical    Min (lbs)  5    Max (lbs)  14    Hold Time  99    Rest Time  5    Time  15      Manual Therapy  Manual Therapy  Soft tissue mobilization    Soft tissue mobilization  STW/TPR to L UT/ cervical paraspinals region to reduce pain                  PT Long Term Goals - 11/16/18 1330      PT LONG TERM GOAL #1   Title  Perform ADL's with pain not > 3/10.    Time  6    Period  Weeks    Status  New      PT LONG TERM GOAL #2   Title  Active left shoulder flexion to 125 degrees so the patient can easily reach overhead    Time  6    Period  Weeks    Status  New      PT LONG TERM GOAL #3   Title  Left shoulder ER to 65 degrees.    Time  6    Period  Weeks    Status  New      PT LONG TERM GOAL #4   Title  Reach behind back with left hand to L3.    Time  6    Period  Weeks    Status  New      PT LONG TERM GOAL #5   Title  Left active cervical rotation to 70 degrees.    Time  6    Period  Weeks    Status  New            Plan - 11/26/18 1123    Clinical Impression Statement  Patient presented in clinic with improvements regarding cervical spine and less LUE radicular symptoms. Moderate tightness palpable of L mid UT and into superior UT/cervical paraspinals region. Normal complaints of any increased soreness or pain during manual therapy.  Mechanical traction reduced to 14# max per patient request.    Personal Factors and Comorbidities  Comorbidity 1;Comorbidity 2    Comorbidities  HTN, DM, cervical disc disease.    Examination-Activity Limitations  Reach Overhead;Other;Lift    Examination-Participation Restrictions  Other    Stability/Clinical Decision Making  Evolving/Moderate complexity    Rehab Potential  Good    PT Frequency  2x / week    PT Duration  6 weeks    PT Treatment/Interventions  ADLs/Self Care Home Management;Cryotherapy;Electrical Stimulation;Ultrasound;Traction;Moist Heat;Therapeutic activities;Therapeutic exercise;Manual techniques;Patient/family education;Passive range of motion;Dry needling;Joint Manipulations    PT Next Visit Plan  Int cervical traction at 14#, chin tucks with cervical extension, STW/M, combo e'stim/U/S, range of motion to patient's left shoulder.    Consulted and Agree with Plan of Care  Patient       Patient will benefit from skilled therapeutic intervention in order to improve the following deficits and impairments:  Pain, Postural dysfunction, Decreased activity tolerance, Decreased range of motion, Increased muscle spasms  Visit Diagnosis: Cervicalgia  Chronic left shoulder pain     Problem List Patient Active Problem List   Diagnosis Date Noted  . Bilateral hearing loss 12/21/2017  . Eustachian tube disorder, bilateral 12/21/2017  . Cough 02/23/2017  . Allergic conjunctivitis 12/12/2016  . Left hand pain 05/26/2016  . Mucoid cyst, joint 05/26/2016  . Pain in finger of right hand 05/26/2016  . Primary osteoarthritis of both first carpometacarpal joints 05/26/2016  . Skin avulsion 05/26/2016  . Adhesive capsulitis of left shoulder 03/20/2016  . Fatigue 12/16/2015  . Acute bronchitis 10/31/2015  . Urinary frequency 03/09/2015  . Hearing loss, sensorineural, asymmetrical 11/09/2014  . Vestibular hypofunction,  left 11/09/2014  . Morbid obesity ([place]) 07/25/2014  .  Orthostatic hypotension 07/07/2014  . Upper airway cough syndrome 06/26/2014  . Diarrhea 05/02/2014  . Dizziness 04/12/2014  . Neuropathy   . Rash and nonspecific skin eruption 08/30/2013  . Paresthesia of both feet 08/30/2013  . Blurred vision 02/15/2013  . Eye pain 02/15/2013  . Sinusitis, chronic 10/07/2012  . Left shoulder pain 04/15/2012  . Preventative health care 10/22/2011  . Cervical disc disease 10/22/2011  . Bilateral hand pain 08/05/2011  . SNHL (sensorineural hearing loss) 08/04/2011  . Fibrocystic breast disease 07/02/2011  . Hypertension 07/01/2011  . GERD (gastroesophageal reflux disease) 06/29/2011  . Bloating 06/29/2011  . Back pain 03/13/2011  . Vertigo 01/04/2011  . Nystagmus 01/04/2011  . Insomnia 10/21/2010  . CONSTIPATION 08/17/2009  . VITAMIN D DEFICIENCY 05/15/2009  . DYSPNEA 03/13/2009  . Depression with anxiety 01/16/2009  . Irritable bowel syndrome 01/16/2009  . Primary osteoarthritis of both hands 12/15/2008  . Diabetes ([place]) 11/03/2008  . Hyperlipidemia 11/03/2008  . Anxiety state 11/03/2008  . OVERACTIVE BLADDER 11/03/2008  . OSA (obstructive sleep apnea) 11/03/2008  . MURMUR 11/03/2008    Standley Brooking, PTA 11/26/2018, 11:32 AM  [facility] [address] [place], [place], [address] Phone: [phone]   Fax:  [phone]  Name: Laura [name] MRN: [id number] Date of Birth: 03-18-1945

## 2018-11-29 ENCOUNTER — Ambulatory Visit: Payer: Medicare Other | Admitting: Physical Therapy

## 2018-11-29 ENCOUNTER — Encounter: Payer: Self-pay | Admitting: Physical Therapy

## 2018-11-29 ENCOUNTER — Other Ambulatory Visit: Payer: Self-pay

## 2018-11-29 DIAGNOSIS — M542 Cervicalgia: Secondary | ICD-10-CM | POA: Diagnosis not present

## 2018-11-29 DIAGNOSIS — G8929 Other chronic pain: Secondary | ICD-10-CM

## 2018-11-29 DIAGNOSIS — M25512 Pain in left shoulder: Secondary | ICD-10-CM | POA: Diagnosis not present

## 2018-11-29 NOTE — Therapy (Signed)
[facility] [place], [place], [address] Phone: [phone]   Fax:  [phone]  Physical Therapy Treatment  Patient Details  Name: Laura [name] MRN: [id number] Date of Birth: 02-12-1945 Referring Provider (PT): Eunice Blase MD   Encounter Date: 11/29/2018  PT End of Session - 11/29/18 1426    Visit Number  5    Number of Visits  12    Date for PT Re-Evaluation  02/14/19    Authorization Type  PROGRESS NOTE AT 10TH VISIT.  KX MODIFIER AFTER 15 VISITS.    PT Start Time  [phone]    PT Stop Time  0229    PT Time Calculation (min)  43 min    Activity Tolerance  Patient tolerated treatment well    Behavior During Therapy  [facility] for tasks assessed/performed       Past Medical History:  Diagnosis Date  . Anxiety   . Arthritis    back of neck, bones spurs on neck  . Cataract   . Cervical disc disease   . Diabetes mellitus   . Hyperlipidemia   . Hypertension   . Mucoid cyst of joint    right thumb  . Neuropathy   . Reflux   . Sleep apnea    wears CPAP nightly  . Vertigo     Past Surgical History:  Procedure Laterality Date  . BLADDER SURGERY    . BREAST EXCISIONAL BIOPSY Bilateral   . BREAST SURGERY    . CHOLECYSTECTOMY    . fibroids removed     breast (both breasts)  . MASS EXCISION Right 06/26/2016   Procedure: EXCISION MUCOID TUMOR RIGHT THUMB, IP RIGHT THUMB;  Surgeon: Daryll Brod, MD;  Location: [facility];  Service: Orthopedics;  Laterality: Right;  . PARTIAL HYSTERECTOMY      There were no vitals filed for this visit.  Subjective Assessment - 11/29/18 1348    Subjective  COVID 19 screening performed on patient upon arrival. Patient reports ongoing pain today    Pertinent History  HTN, DM, cervical disc disease.    Patient Stated Goals  Get out of pain.    Currently in Pain?  Yes    Pain Score  3     Pain Location  Neck    Pain Orientation  Left    Pain Descriptors / Indicators   Discomfort    Pain Type  Chronic pain    Pain Onset  More than a month ago    Pain Frequency  Intermittent    Aggravating Factors   overhead reaching and lifting    Pain Relieving Factors  rest                       [facility] Adult PT Treatment/Exercise - 11/29/18 0001      Ultrasound   Ultrasound Location  L UT    Ultrasound Parameters  1.5w/cm2/100%/41mhz x20min    Ultrasound Goals  Pain      Traction   Type of Traction  Cervical    Min (lbs)  5    Max (lbs)  14    Hold Time  99    Rest Time  5    Time  15      Manual Therapy   Manual Therapy  Soft tissue mobilization    Soft tissue mobilization  STW/TPR to L UT/ cervical paraspinals region to reduce pain  PT Long Term Goals - 11/16/18 1330      PT LONG TERM GOAL #1   Title  Perform ADL's with pain not > 3/10.    Time  6    Period  Weeks    Status  New      PT LONG TERM GOAL #2   Title  Active left shoulder flexion to 125 degrees so the patient can easily reach overhead    Time  6    Period  Weeks    Status  New      PT LONG TERM GOAL #3   Title  Left shoulder ER to 65 degrees.    Time  6    Period  Weeks    Status  New      PT LONG TERM GOAL #4   Title  Reach behind back with left hand to L3.    Time  6    Period  Weeks    Status  New      PT LONG TERM GOAL #5   Title  Left active cervical rotation to 70 degrees.    Time  6    Period  Weeks    Status  New            Plan - 11/29/18 1428    Clinical Impression Statement  Patient tolerated treatment well today. Patient feels overall improvement. Patient has some ongoing left cervical pain in UT that radiates down arm. patient has responded well to cervical traction at #14 today. Patient goals ongoing at this time.    Personal Factors and Comorbidities  Comorbidity 1;Comorbidity 2    Comorbidities  HTN, DM, cervical disc disease.    Examination-Activity Limitations  Reach Overhead;Other;Lift     Examination-Participation Restrictions  Other    Stability/Clinical Decision Making  Evolving/Moderate complexity    Rehab Potential  Good    PT Frequency  2x / week    PT Treatment/Interventions  ADLs/Self Care Home Management;Cryotherapy;Electrical Stimulation;Ultrasound;Traction;Moist Heat;Therapeutic activities;Therapeutic exercise;Manual techniques;Patient/family education;Passive range of motion;Dry needling;Joint Manipulations    PT Next Visit Plan  cont with Int cervical traction at 14#, chin tucks with cervical extension, STW/M, combo e'stim/U/S, range of motion to patient's left shoulder.    Consulted and Agree with Plan of Care  Patient       Patient will benefit from skilled therapeutic intervention in order to improve the following deficits and impairments:  Pain, Postural dysfunction, Decreased activity tolerance, Decreased range of motion, Increased muscle spasms  Visit Diagnosis: Chronic left shoulder pain  Cervicalgia     Problem List Patient Active Problem List   Diagnosis Date Noted  . Bilateral hearing loss 12/21/2017  . Eustachian tube disorder, bilateral 12/21/2017  . Cough 02/23/2017  . Allergic conjunctivitis 12/12/2016  . Left hand pain 05/26/2016  . Mucoid cyst, joint 05/26/2016  . Pain in finger of right hand 05/26/2016  . Primary osteoarthritis of both first carpometacarpal joints 05/26/2016  . Skin avulsion 05/26/2016  . Adhesive capsulitis of left shoulder 03/20/2016  . Fatigue 12/16/2015  . Acute bronchitis 10/31/2015  . Urinary frequency 03/09/2015  . Hearing loss, sensorineural, asymmetrical 11/09/2014  . Vestibular hypofunction, left 11/09/2014  . Morbid obesity ([place]) 07/25/2014  . Orthostatic hypotension 07/07/2014  . Upper airway cough syndrome 06/26/2014  . Diarrhea 05/02/2014  . Dizziness 04/12/2014  . Neuropathy   . Rash and nonspecific skin eruption 08/30/2013  . Paresthesia of both feet 08/30/2013  . Blurred vision 02/15/2013  .  Eye pain 02/15/2013  . Sinusitis, chronic 10/07/2012  . Left shoulder pain 04/15/2012  . Preventative health care 10/22/2011  . Cervical disc disease 10/22/2011  . Bilateral hand pain 08/05/2011  . SNHL (sensorineural hearing loss) 08/04/2011  . Fibrocystic breast disease 07/02/2011  . Hypertension 07/01/2011  . GERD (gastroesophageal reflux disease) 06/29/2011  . Bloating 06/29/2011  . Back pain 03/13/2011  . Vertigo 01/04/2011  . Nystagmus 01/04/2011  . Insomnia 10/21/2010  . CONSTIPATION 08/17/2009  . VITAMIN D DEFICIENCY 05/15/2009  . DYSPNEA 03/13/2009  . Depression with anxiety 01/16/2009  . Irritable bowel syndrome 01/16/2009  . Primary osteoarthritis of both hands 12/15/2008  . Diabetes ([place]) 11/03/2008  . Hyperlipidemia 11/03/2008  . Anxiety state 11/03/2008  . OVERACTIVE BLADDER 11/03/2008  . OSA (obstructive sleep apnea) 11/03/2008  . MURMUR 11/03/2008    Tyaire Odem P, PTA 11/29/2018, 2:30 PM  [facility] [address] [place], [place], [address] Phone: [phone]   Fax:  [phone]  Name: Laura [name] MRN: [id number] Date of Birth: 22-Dec-1945

## 2018-12-03 ENCOUNTER — Other Ambulatory Visit: Payer: Self-pay

## 2018-12-03 ENCOUNTER — Ambulatory Visit: Payer: Medicare Other | Admitting: *Deleted

## 2018-12-03 DIAGNOSIS — M25512 Pain in left shoulder: Secondary | ICD-10-CM | POA: Diagnosis not present

## 2018-12-03 DIAGNOSIS — G8929 Other chronic pain: Secondary | ICD-10-CM

## 2018-12-03 DIAGNOSIS — M542 Cervicalgia: Secondary | ICD-10-CM | POA: Diagnosis not present

## 2018-12-03 NOTE — Therapy (Signed)
[facility] [place], [place], [address] Phone: [phone]   Fax:  [phone]  Physical Therapy Treatment  Patient Details  Name: Laura [name] MRN: [id number] Date of Birth: 04/11/1945 Referring Provider (PT): Eunice Blase MD   Encounter Date: 12/03/2018  PT End of Session - 12/03/18 1106    Visit Number  6    Number of Visits  12    Date for PT Re-Evaluation  02/14/19    Authorization Type  PROGRESS NOTE AT 10TH VISIT.  KX MODIFIER AFTER 15 VISITS.    PT Start Time  1033    PT Stop Time  1121    PT Time Calculation (min)  48 min       Past Medical History:  Diagnosis Date  . Anxiety   . Arthritis    back of neck, bones spurs on neck  . Cataract   . Cervical disc disease   . Diabetes mellitus   . Hyperlipidemia   . Hypertension   . Mucoid cyst of joint    right thumb  . Neuropathy   . Reflux   . Sleep apnea    wears CPAP nightly  . Vertigo     Past Surgical History:  Procedure Laterality Date  . BLADDER SURGERY    . BREAST EXCISIONAL BIOPSY Bilateral   . BREAST SURGERY    . CHOLECYSTECTOMY    . fibroids removed     breast (both breasts)  . MASS EXCISION Right 06/26/2016   Procedure: EXCISION MUCOID TUMOR RIGHT THUMB, IP RIGHT THUMB;  Surgeon: Daryll Brod, MD;  Location: [place];  Service: Orthopedics;  Laterality: Right;  . PARTIAL HYSTERECTOMY      There were no vitals filed for this visit.  Subjective Assessment - 12/03/18 1040    Subjective  COVID 19 screening performed on patient upon arrival. Patient reports ongoing pain today, but Rxs are helping    Pertinent History  HTN, DM, cervical disc disease.    Patient Stated Goals  Get out of pain.    Currently in Pain?  Yes    Pain Score  3     Pain Location  Neck    Pain Orientation  Left    Pain Descriptors / Indicators  Discomfort    Pain Type  Chronic pain    Pain Onset  More than a month ago                        [facility] Adult PT Treatment/Exercise - 12/03/18 0001      Modalities   Modalities  Electrical Stimulation;Moist Heat;Traction      Acupuncturist Location  LT UT    Electrical Stimulation Action  IFC    Electrical Stimulation Parameters  80-150hz  x 15 mins    Electrical Stimulation Goals  Pain      Ultrasound   Ultrasound Location  L UT    Ultrasound Parameters  1.5 w/cm2x 10 mins    Ultrasound Goals  Pain      Traction   Type of Traction  Cervical    Min (lbs)  5    Max (lbs)  16    Hold Time  99    Rest Time  5    Time  15                  PT Long Term Goals - 11/16/18 1330  PT LONG TERM GOAL #1   Title  Perform ADL's with pain not > 3/10.    Time  6    Period  Weeks    Status  New      PT LONG TERM GOAL #2   Title  Active left shoulder flexion to 125 degrees so the patient can easily reach overhead    Time  6    Period  Weeks    Status  New      PT LONG TERM GOAL #3   Title  Left shoulder ER to 65 degrees.    Time  6    Period  Weeks    Status  New      PT LONG TERM GOAL #4   Title  Reach behind back with left hand to L3.    Time  6    Period  Weeks    Status  New      PT LONG TERM GOAL #5   Title  Left active cervical rotation to 70 degrees.    Time  6    Period  Weeks    Status  New            Plan - 12/03/18 1108    Clinical Impression Statement  Pt arrived today reporting Rxs are helping with decreased symptoms in LT UE. She did well with [place] combo and requested Estim and HMP to neck and LT shldr. Traction was increased to 16 #s today and tolerated well. Normal modality response today.    Comorbidities  HTN, DM, cervical disc disease.    Examination-Activity Limitations  Reach Overhead;Other;Lift    Examination-Participation Restrictions  Other    Stability/Clinical Decision Making  Evolving/Moderate complexity    Rehab Potential  Good    PT Frequency  2x /  week    PT Duration  6 weeks    PT Treatment/Interventions  ADLs/Self Care Home Management;Cryotherapy;Electrical Stimulation;Ultrasound;Traction;Moist Heat;Therapeutic activities;Therapeutic exercise;Manual techniques;Patient/family education;Passive range of motion;Dry needling;Joint Manipulations    PT Next Visit Plan  Assess traction at  16#, chin tucks with cervical extension, STW/M, combo e'stim/U/S, range of motion to patient's left shoulder.       Patient will benefit from skilled therapeutic intervention in order to improve the following deficits and impairments:  Pain, Postural dysfunction, Decreased activity tolerance, Decreased range of motion, Increased muscle spasms  Visit Diagnosis: Chronic left shoulder pain  Cervicalgia     Problem List Patient Active Problem List   Diagnosis Date Noted  . Bilateral hearing loss 12/21/2017  . Eustachian tube disorder, bilateral 12/21/2017  . Cough 02/23/2017  . Allergic conjunctivitis 12/12/2016  . Left hand pain 05/26/2016  . Mucoid cyst, joint 05/26/2016  . Pain in finger of right hand 05/26/2016  . Primary osteoarthritis of both first carpometacarpal joints 05/26/2016  . Skin avulsion 05/26/2016  . Adhesive capsulitis of left shoulder 03/20/2016  . Fatigue 12/16/2015  . Acute bronchitis 10/31/2015  . Urinary frequency 03/09/2015  . Hearing loss, sensorineural, asymmetrical 11/09/2014  . Vestibular hypofunction, left 11/09/2014  . Morbid obesity ([place]) 07/25/2014  . Orthostatic hypotension 07/07/2014  . Upper airway cough syndrome 06/26/2014  . Diarrhea 05/02/2014  . Dizziness 04/12/2014  . Neuropathy   . Rash and nonspecific skin eruption 08/30/2013  . Paresthesia of both feet 08/30/2013  . Blurred vision 02/15/2013  . Eye pain 02/15/2013  . Sinusitis, chronic 10/07/2012  . Left shoulder pain 04/15/2012  . Preventative health care 10/22/2011  . Cervical disc disease  10/22/2011  . Bilateral hand pain 08/05/2011  .  SNHL (sensorineural hearing loss) 08/04/2011  . Fibrocystic breast disease 07/02/2011  . Hypertension 07/01/2011  . GERD (gastroesophageal reflux disease) 06/29/2011  . Bloating 06/29/2011  . Back pain 03/13/2011  . Vertigo 01/04/2011  . Nystagmus 01/04/2011  . Insomnia 10/21/2010  . CONSTIPATION 08/17/2009  . VITAMIN D DEFICIENCY 05/15/2009  . DYSPNEA 03/13/2009  . Depression with anxiety 01/16/2009  . Irritable bowel syndrome 01/16/2009  . Primary osteoarthritis of both hands 12/15/2008  . Diabetes ([place]) 11/03/2008  . Hyperlipidemia 11/03/2008  . Anxiety state 11/03/2008  . OVERACTIVE BLADDER 11/03/2008  . OSA (obstructive sleep apnea) 11/03/2008  . MURMUR 11/03/2008    Dyanara Cozza,CHRIS, PTA 12/03/2018, 11:21 AM  [facility] [place], [place], [address] Phone: [phone]   Fax:  [phone]  Name: NATACIA JUILLERAT MRN: [id number] Date of Birth: January 16, 1945

## 2018-12-06 ENCOUNTER — Ambulatory Visit: Payer: Medicare Other | Admitting: Physical Therapy

## 2018-12-07 ENCOUNTER — Other Ambulatory Visit: Payer: Self-pay

## 2018-12-07 ENCOUNTER — Encounter: Payer: Self-pay | Admitting: Physical Therapy

## 2018-12-07 ENCOUNTER — Ambulatory Visit: Payer: Medicare Other | Admitting: Physical Therapy

## 2018-12-07 DIAGNOSIS — M542 Cervicalgia: Secondary | ICD-10-CM

## 2018-12-07 DIAGNOSIS — M25512 Pain in left shoulder: Secondary | ICD-10-CM

## 2018-12-07 DIAGNOSIS — G8929 Other chronic pain: Secondary | ICD-10-CM | POA: Diagnosis not present

## 2018-12-07 NOTE — Therapy (Signed)
[facility] [place], [place], [address] Phone: [phone]   Fax:  [phone]  Physical Therapy Treatment  Patient Details  Name: Laura [name] MRN: [id number] Date of Birth: 1945/05/05 Referring Provider (PT): Eunice Blase MD   Encounter Date: 12/07/2018  PT End of Session - 12/07/18 1349    Visit Number  7    Number of Visits  12    Date for PT Re-Evaluation  02/14/19    Authorization Type  PROGRESS NOTE AT 10TH VISIT.  KX MODIFIER AFTER 15 VISITS.    PT Start Time  1349    PT Stop Time  1433    PT Time Calculation (min)  44 min    Activity Tolerance  Patient tolerated treatment well    Behavior During Therapy  WFL for tasks assessed/performed       Past Medical History:  Diagnosis Date  . Anxiety   . Arthritis    back of neck, bones spurs on neck  . Cataract   . Cervical disc disease   . Diabetes mellitus   . Hyperlipidemia   . Hypertension   . Mucoid cyst of joint    right thumb  . Neuropathy   . Reflux   . Sleep apnea    wears CPAP nightly  . Vertigo     Past Surgical History:  Procedure Laterality Date  . BLADDER SURGERY    . BREAST EXCISIONAL BIOPSY Bilateral   . BREAST SURGERY    . CHOLECYSTECTOMY    . fibroids removed     breast (both breasts)  . MASS EXCISION Right 06/26/2016   Procedure: EXCISION MUCOID TUMOR RIGHT THUMB, IP RIGHT THUMB;  Surgeon: Daryll Brod, MD;  Location: [place];  Service: Orthopedics;  Laterality: Right;  . PARTIAL HYSTERECTOMY      There were no vitals filed for this visit.  Subjective Assessment - 12/07/18 1348    Subjective  COVID 19 screening performed on patient upon arrival. Patient reports feeling better but having more pain primarily in L UT.    Pertinent History  HTN, DM, cervical disc disease.    Patient Stated Goals  Get out of pain.    Currently in Pain?  Yes    Pain Score  2     Pain Location  Neck    Pain Orientation  Left    Pain Descriptors / Indicators  Dull;Aching    Pain Type  Chronic pain    Pain Onset  More than a month ago    Pain Frequency  Intermittent                       [facility] Adult PT Treatment/Exercise - 12/07/18 0001      Modalities   Modalities  Ultrasound;Traction      Ultrasound   Ultrasound Location  L UT    Ultrasound Parameters  Combo 1.5 w/cm2, 100%, 1 mhz x10 min    Ultrasound Goals  Pain      Traction   Type of Traction  Cervical    Min (lbs)  5    Max (lbs)  16    Hold Time  99    Rest Time  5    Time  15      Manual Therapy   Manual Therapy  Soft tissue mobilization    Soft tissue mobilization  STW/TPR to L UT/ cervical paraspinals region to reduce pain  PT Long Term Goals - 11/16/18 1330      PT LONG TERM GOAL #1   Title  Perform ADL's with pain not > 3/10.    Time  6    Period  Weeks    Status  New      PT LONG TERM GOAL #2   Title  Active left shoulder flexion to 125 degrees so the patient can easily reach overhead    Time  6    Period  Weeks    Status  New      PT LONG TERM GOAL #3   Title  Left shoulder ER to 65 degrees.    Time  6    Period  Weeks    Status  New      PT LONG TERM GOAL #4   Title  Reach behind back with left hand to L3.    Time  6    Period  Weeks    Status  New      PT LONG TERM GOAL #5   Title  Left active cervical rotation to 70 degrees.    Time  6    Period  Weeks    Status  New            Plan - 12/07/18 1512    Clinical Impression Statement  Patient presented in clinic with reports of overall improvement. Patient experienced more dull, ache upon arrival in L UT. TP and tone palpable in L UT region upon palpation. Normal modalities response noted following removal of the modalities.    Personal Factors and Comorbidities  Comorbidity 1;Comorbidity 2    Comorbidities  HTN, DM, cervical disc disease.    Examination-Activity Limitations  Reach Overhead;Other;Lift     Examination-Participation Restrictions  Other    Stability/Clinical Decision Making  Evolving/Moderate complexity    Rehab Potential  Good    PT Frequency  2x / week    PT Duration  6 weeks    PT Treatment/Interventions  ADLs/Self Care Home Management;Cryotherapy;Electrical Stimulation;Ultrasound;Traction;Moist Heat;Therapeutic activities;Therapeutic exercise;Manual techniques;Patient/family education;Passive range of motion;Dry needling;Joint Manipulations    PT Next Visit Plan  Assess traction at  16#, chin tucks with cervical extension, STW/M, combo e'stim/U/S, range of motion to patient's left shoulder.    Consulted and Agree with Plan of Care  Patient       Patient will benefit from skilled therapeutic intervention in order to improve the following deficits and impairments:     Visit Diagnosis: Chronic left shoulder pain  Cervicalgia     Problem List Patient Active Problem List   Diagnosis Date Noted  . Bilateral hearing loss 12/21/2017  . Eustachian tube disorder, bilateral 12/21/2017  . Cough 02/23/2017  . Allergic conjunctivitis 12/12/2016  . Left hand pain 05/26/2016  . Mucoid cyst, joint 05/26/2016  . Pain in finger of right hand 05/26/2016  . Primary osteoarthritis of both first carpometacarpal joints 05/26/2016  . Skin avulsion 05/26/2016  . Adhesive capsulitis of left shoulder 03/20/2016  . Fatigue 12/16/2015  . Acute bronchitis 10/31/2015  . Urinary frequency 03/09/2015  . Hearing loss, sensorineural, asymmetrical 11/09/2014  . Vestibular hypofunction, left 11/09/2014  . Morbid obesity ([place]) 07/25/2014  . Orthostatic hypotension 07/07/2014  . Upper airway cough syndrome 06/26/2014  . Diarrhea 05/02/2014  . Dizziness 04/12/2014  . Neuropathy   . Rash and nonspecific skin eruption 08/30/2013  . Paresthesia of both feet 08/30/2013  . Blurred vision 02/15/2013  . Eye pain 02/15/2013  . Sinusitis,  chronic 10/07/2012  . Left shoulder pain 04/15/2012  .  Preventative health care 10/22/2011  . Cervical disc disease 10/22/2011  . Bilateral hand pain 08/05/2011  . SNHL (sensorineural hearing loss) 08/04/2011  . Fibrocystic breast disease 07/02/2011  . Hypertension 07/01/2011  . GERD (gastroesophageal reflux disease) 06/29/2011  . Bloating 06/29/2011  . Back pain 03/13/2011  . Vertigo 01/04/2011  . Nystagmus 01/04/2011  . Insomnia 10/21/2010  . CONSTIPATION 08/17/2009  . VITAMIN D DEFICIENCY 05/15/2009  . DYSPNEA 03/13/2009  . Depression with anxiety 01/16/2009  . Irritable bowel syndrome 01/16/2009  . Primary osteoarthritis of both hands 12/15/2008  . Diabetes ([place]) 11/03/2008  . Hyperlipidemia 11/03/2008  . Anxiety state 11/03/2008  . OVERACTIVE BLADDER 11/03/2008  . OSA (obstructive sleep apnea) 11/03/2008  . MURMUR 11/03/2008    Standley Brooking, PTA 12/07/2018, 3:51 PM  [facility] [address] [place], [place], [address] Phone: [phone]   Fax:  [phone]  Name: Laura [name] MRN: [id number] Date of Birth: Jun 05, 1945

## 2018-12-13 ENCOUNTER — Other Ambulatory Visit: Payer: Self-pay

## 2018-12-13 ENCOUNTER — Encounter: Payer: Self-pay | Admitting: Physical Therapy

## 2018-12-13 ENCOUNTER — Ambulatory Visit: Payer: Medicare Other | Admitting: Physical Therapy

## 2018-12-13 DIAGNOSIS — M542 Cervicalgia: Secondary | ICD-10-CM

## 2018-12-13 DIAGNOSIS — M25512 Pain in left shoulder: Secondary | ICD-10-CM | POA: Diagnosis not present

## 2018-12-13 DIAGNOSIS — G8929 Other chronic pain: Secondary | ICD-10-CM

## 2018-12-13 NOTE — Therapy (Signed)
[facility] [place], [place], [address] Phone: [phone]   Fax:  [phone]  Physical Therapy Treatment  Patient Details  Name: Laura [name] MRN: [id number] Date of Birth: [birth year] Referring Provider (PT): Eunice Blase MD   Encounter Date: 12/13/2018  PT End of Session - 12/13/18 1307    Visit Number  8    Number of Visits  12    Date for PT Re-Evaluation  02/14/19    Authorization Type  PROGRESS NOTE AT 10TH VISIT.  KX MODIFIER AFTER 15 VISITS.    PT Start Time  1303    PT Stop Time  1355    PT Time Calculation (min)  52 min    Activity Tolerance  Patient tolerated treatment well    Behavior During Therapy  WFL for tasks assessed/performed       Past Medical History:  Diagnosis Date  . Anxiety   . Arthritis    back of neck, bones spurs on neck  . Cataract   . Cervical disc disease   . Diabetes mellitus   . Hyperlipidemia   . Hypertension   . Mucoid cyst of joint    right thumb  . Neuropathy   . Reflux   . Sleep apnea    wears CPAP nightly  . Vertigo     Past Surgical History:  Procedure Laterality Date  . BLADDER SURGERY    . BREAST EXCISIONAL BIOPSY Bilateral   . BREAST SURGERY    . CHOLECYSTECTOMY    . fibroids removed     breast (both breasts)  . MASS EXCISION Right 06/26/2016   Procedure: EXCISION MUCOID TUMOR RIGHT THUMB, IP RIGHT THUMB;  Surgeon: Daryll Brod, MD;  Location: [place];  Service: Orthopedics;  Laterality: Right;  . PARTIAL HYSTERECTOMY      There were no vitals filed for this visit.  Subjective Assessment - 12/13/18 1259    Subjective  COVID 19 screening performed on patient upon arrival. Patient reports that after the last few sessions of traction that LBP has presented but went away. After last session of cervical traction she states that LBP has not gone away and radiating to B sides. Has had some cervical pain radiate into posterior skull but goes  away. States that LUE radicular symptoms only occur occasionally now and goes down to L thumb. Reports difficulty in cutting fatty, grissly ham on thanksgiving.    Pertinent History  HTN, DM, cervical disc disease.    Patient Stated Goals  Get out of pain.    Currently in Pain?  Yes    Pain Score  2     Pain Location  Neck    Pain Orientation  Posterior    Pain Descriptors / Indicators  Discomfort    Pain Type  Chronic pain    Pain Onset  More than a month ago    Pain Frequency  Intermittent    Multiple Pain Sites  Yes    Pain Score  5    Pain Location  Back    Pain Orientation  Lower    Pain Descriptors / Indicators  Discomfort    Pain Type  Chronic pain    Pain Onset  In the past 7 days    Pain Frequency  Constant         [facility] PT Assessment - 12/13/18 0001      Assessment   Medical Diagnosis  Chronic left shoulder and neck pain.  Referring Provider (PT)  Eunice Blase MD      Precautions   Precautions  None      Restrictions   Weight Bearing Restrictions  No                   [facility] Adult PT Treatment/Exercise - 12/13/18 0001      Exercises   Exercises  Shoulder      Shoulder Exercises: Pulleys   Flexion  5 minutes      Shoulder Exercises: ROM/Strengthening   UBE (Upper Arm Bike)  120 RPM x6 min      Modalities   Modalities  Electrical Stimulation;Ultrasound      Acupuncturist Location  B UT    Electrical Stimulation Action  Pre-Mod    Electrical Stimulation Parameters  80-150 hz x15 min    Electrical Stimulation Goals  Pain;Tone      Ultrasound   Ultrasound Location  L UT     Ultrasound Parameters  Combo 1.5 w/cm2, 100%, 1 mhz x10 min    Ultrasound Goals  Pain      Manual Therapy   Manual Therapy  Soft tissue mobilization    Soft tissue mobilization  STW/TPR to L UT/ cervical paraspinals region to reduce pain                  PT Long Term Goals - 12/13/18 1315      PT LONG TERM GOAL #1    Title  Perform ADL's with pain not > 3/10.    Time  6    Period  Weeks    Status  On-going      PT LONG TERM GOAL #2   Title  Active left shoulder flexion to 125 degrees so the patient can easily reach overhead    Time  6    Period  Weeks    Status  Achieved      PT LONG TERM GOAL #3   Title  Left shoulder ER to 65 degrees.    Time  6    Period  Weeks    Status  On-going      PT LONG TERM GOAL #4   Title  Reach behind back with left hand to L3.    Time  6    Period  Weeks    Status  On-going   L glute 12/13/2018     PT LONG TERM GOAL #5   Title  Left active cervical rotation to 70 degrees.    Time  6    Period  Weeks    Status  On-going            Plan - 12/13/18 1349    Clinical Impression Statement  Patient presented in clinic with reports of increased LBP since previous cervical traction session. Patient reports limitation with L cervical ROM and L shoulder ROM as it has been much better. More palpable L UT tone noted today in superior fibers. Normal modalities response noted following removal of the modalities. Patient advised that we would skip mechanical cervical traction to allow for LBP to subside.    Personal Factors and Comorbidities  Comorbidity 1;Comorbidity 2    Comorbidities  HTN, DM, cervical disc disease.    Examination-Activity Limitations  Reach Overhead;Other;Lift    Examination-Participation Restrictions  Other    Stability/Clinical Decision Making  Evolving/Moderate complexity    Rehab Potential  Good    PT Frequency  2x / week  PT Duration  6 weeks    PT Treatment/Interventions  ADLs/Self Care Home Management;Cryotherapy;Electrical Stimulation;Ultrasound;Traction;Moist Heat;Therapeutic activities;Therapeutic exercise;Manual techniques;Patient/family education;Passive range of motion;Dry needling;Joint Manipulations    PT Next Visit Plan  Assess cervical symptoms and LBP next treatment.    Consulted and Agree with Plan of Care  Patient        Patient will benefit from skilled therapeutic intervention in order to improve the following deficits and impairments:  Pain, Postural dysfunction, Decreased activity tolerance, Decreased range of motion, Increased muscle spasms  Visit Diagnosis: Chronic left shoulder pain  Cervicalgia     Problem List Patient Active Problem List   Diagnosis Date Noted  . Bilateral hearing loss 12/21/2017  . Eustachian tube disorder, bilateral 12/21/2017  . Cough 02/23/2017  . Allergic conjunctivitis 12/12/2016  . Left hand pain 05/26/2016  . Mucoid cyst, joint 05/26/2016  . Pain in finger of right hand 05/26/2016  . Primary osteoarthritis of both first carpometacarpal joints 05/26/2016  . Skin avulsion 05/26/2016  . Adhesive capsulitis of left shoulder 03/20/2016  . Fatigue 12/16/2015  . Acute bronchitis 10/31/2015  . Urinary frequency 03/09/2015  . Hearing loss, sensorineural, asymmetrical 11/09/2014  . Vestibular hypofunction, left 11/09/2014  . Morbid obesity ([place]) 07/25/2014  . Orthostatic hypotension 07/07/2014  . Upper airway cough syndrome 06/26/2014  . Diarrhea 05/02/2014  . Dizziness 04/12/2014  . Neuropathy   . Rash and nonspecific skin eruption 08/30/2013  . Paresthesia of both feet 08/30/2013  . Blurred vision 02/15/2013  . Eye pain 02/15/2013  . Sinusitis, chronic 10/07/2012  . Left shoulder pain 04/15/2012  . Preventative health care 10/22/2011  . Cervical disc disease 10/22/2011  . Bilateral hand pain 08/05/2011  . SNHL (sensorineural hearing loss) 08/04/2011  . Fibrocystic breast disease 07/02/2011  . Hypertension 07/01/2011  . GERD (gastroesophageal reflux disease) 06/29/2011  . Bloating 06/29/2011  . Back pain 03/13/2011  . Vertigo 01/04/2011  . Nystagmus 01/04/2011  . Insomnia 10/21/2010  . CONSTIPATION 08/17/2009  . VITAMIN D DEFICIENCY 05/15/2009  . DYSPNEA 03/13/2009  . Depression with anxiety 01/16/2009  . Irritable bowel syndrome 01/16/2009  .  Primary osteoarthritis of both hands 12/15/2008  . Diabetes ([place]) 11/03/2008  . Hyperlipidemia 11/03/2008  . Anxiety state 11/03/2008  . OVERACTIVE BLADDER 11/03/2008  . OSA (obstructive sleep apnea) 11/03/2008  . MURMUR 11/03/2008    Standley Brooking, PTA 12/13/2018, 1:59 PM  [facility] [address] [place], [place], [address] Phone: [phone]   Fax:  [phone]  Name: Laura [name] MRN: [id number] Date of Birth: 28-Feb-1945

## 2018-12-16 ENCOUNTER — Ambulatory Visit: Payer: Self-pay | Admitting: *Deleted

## 2018-12-16 NOTE — Telephone Encounter (Signed)
Patient stated that her right big toe has throbbing pain and would like to speak with nurse . Please advise   Returned call to patient regarding pain in her right big toe. Her toe or foot is not swollen or discolored. No redness. She has pain at the joint and the central part of her toe. She has some numbness towards the tip of her toe.  Advised to try to take tylenol or an NSAID  For the pain if no contraindications. She voiced understanding. Per protocol, she should be seen within 3 days. She would like an appointment for this afternoon or tomorrow. [facility], notified regarding an appointment. Call conference in with the office.  Routing to the practice.  Reason for Disposition . Pain in the big toe joint  Answer Assessment - Initial Assessment Questions 1. ONSET: "When did the pain start?"      today 2. LOCATION: "Where is the pain located?"   (e.g., around nail, entire toe, at foot joint)      Right big 3. PAIN: "How bad is the pain?"    (Scale 1-10; or mild, moderate, severe)   -  MILD (1-3): doesn't interfere with normal activities    -  MODERATE (4-7): interferes with normal activities (e.g., work or school) or awakens from sleep, limping    -  SEVERE (8-10): excruciating pain, unable to do any normal activities, unable to walk     Pain # 5-6 4. APPEARANCE: "What does the toe look like?" (e.g., redness, swelling, bruising, pallor)     Like normal 5. CAUSE: "What do you think is causing the toe pain?"     Not sure 6. OTHER SYMPTOMS: "Do you have any other symptoms?" (e.g., leg pain, rash, fever, numbness)     Numbness on the tip of the toe 7. PREGNANCY: "Is there any chance you are pregnant?" "When was your last menstrual period?"     n/a  Protocols used: TOE PAIN-A-AH

## 2018-12-16 NOTE — Telephone Encounter (Signed)
Patient is scheduled for today at 4:00pm.

## 2018-12-17 ENCOUNTER — Ambulatory Visit (INDEPENDENT_AMBULATORY_CARE_PROVIDER_SITE_OTHER): Payer: Medicare Other | Admitting: Internal Medicine

## 2018-12-17 ENCOUNTER — Encounter: Payer: Self-pay | Admitting: Internal Medicine

## 2018-12-17 ENCOUNTER — Other Ambulatory Visit: Payer: Self-pay

## 2018-12-17 ENCOUNTER — Encounter: Payer: Medicare Other | Admitting: *Deleted

## 2018-12-17 VITALS — BP 124/84 | HR 87 | Temp 98.4°F | Ht 64.0 in | Wt 200.0 lb

## 2018-12-17 DIAGNOSIS — N318 Other neuromuscular dysfunction of bladder: Secondary | ICD-10-CM

## 2018-12-17 DIAGNOSIS — I10 Essential (primary) hypertension: Secondary | ICD-10-CM

## 2018-12-17 DIAGNOSIS — E559 Vitamin D deficiency, unspecified: Secondary | ICD-10-CM

## 2018-12-17 DIAGNOSIS — E785 Hyperlipidemia, unspecified: Secondary | ICD-10-CM

## 2018-12-17 DIAGNOSIS — F411 Generalized anxiety disorder: Secondary | ICD-10-CM | POA: Diagnosis not present

## 2018-12-17 DIAGNOSIS — M109 Gout, unspecified: Secondary | ICD-10-CM

## 2018-12-17 MED ORDER — INDOMETHACIN 50 MG PO CAPS: 50.0000 mg | ORAL_CAPSULE | Freq: Three times a day (TID) | ORAL | 1 refills | Status: DC | PRN

## 2018-12-17 MED ORDER — METHYLPREDNISOLONE 4 MG PO TBPK: ORAL_TABLET | ORAL | 0 refills | Status: DC

## 2018-12-17 MED ORDER — METHYLPREDNISOLONE ACETATE 80 MG/ML IJ SUSP
80.0000 mg | Freq: Once | INTRAMUSCULAR | Status: AC
  Administered 2018-12-17: 80 mg via INTRAMUSCULAR

## 2018-12-17 NOTE — Patient Instructions (Signed)
You had the steroid shot today  Please take all new medication as prescribed - the medrol pak, and the indocin as needed in the future  Please continue all other medications as before, and refills have been done if requested.  Please have the pharmacy call with any other refills you may need.  Please continue your efforts at being more active, low cholesterol diet, and weight control.  Please keep your appointments with your specialists as you may have planned

## 2018-12-17 NOTE — Progress Notes (Signed)
Subjective:    Patient ID: JILLAYNE WITTE, female    DOB: [birth year], 73 y.o.   MRN: [id number]  HPI  Here with 2 days acute onset right great toe red, tender, swelling, mod to severe, constant, worse to walk, cant wear a shoe, better to sit. A neighbor gave her a "gout pill" that sounds like indocin and somewhat improved today.  Pt denies chest pain, increased sob or doe, wheezing, orthopnea, PND, increased LE swelling, palpitations, dizziness or syncope.  Pt denies new neurological symptoms such as new headache, or facial or extremity weakness or numbness   Pt denies polydipsia, polyuria.  Denies worsening depressive symptoms, suicidal ideation, or panic; has ongoing anxiety.  Also has increased urinary frequency but Denies urinary symptoms such as dysuria, frequency, urgency, flank pain, hematuria or n/v, fever, chills - askng for vesicare change?  Past Medical History:  Diagnosis Date  . Anxiety   . Arthritis    back of neck, bones spurs on neck  . Cataract   . Cervical disc disease   . Diabetes mellitus   . Hyperlipidemia   . Hypertension   . Mucoid cyst of joint    right thumb  . Neuropathy   . Reflux   . Sleep apnea    wears CPAP nightly  . Vertigo    Past Surgical History:  Procedure Laterality Date  . BLADDER SURGERY    . BREAST EXCISIONAL BIOPSY Bilateral   . BREAST SURGERY    . CHOLECYSTECTOMY    . fibroids removed     breast (both breasts)  . MASS EXCISION Right 06/26/2016   Procedure: EXCISION MUCOID TUMOR RIGHT THUMB, IP RIGHT THUMB;  Surgeon: Daryll Brod, MD;  Location: [facility];  Service: Orthopedics;  Laterality: Right;  . PARTIAL HYSTERECTOMY      reports that she has never smoked. She has never used smokeless tobacco. She reports that she does not drink alcohol or use drugs. family history includes Diabetes in her father, maternal aunt, and sister; Hypertension in her father, mother, sister, and another family member; Stroke in her sister.  Allergies  Allergen Reactions  . Hydrocodone-Homatropine Other (See Comments)    Vertigo *pt strongly prefers to never take*  . Sulfa Antibiotics     Tongue swells, hives, itching  . Augmentin [Amoxicillin-Pot Clavulanate]     Diarrhea; can take PCN/ Amoxicillin  . Codeine     itch  . Crestor [Rosuvastatin Calcium] Other (See Comments)    Did something to memory   . Doxycycline   . Keflex [Cephalexin] Diarrhea and Nausea And Vomiting  . Lipitor [Atorvastatin Calcium] Other (See Comments)    Makes weak   . Naproxen Other (See Comments)    Stomach cramps  . Prednisone Other (See Comments)    Nervous *pt strongly prefers to never be given prednisone*    Current Outpatient Medications on File Prior to Visit  Medication Sig Dispense Refill  . acetaminophen (TYLENOL) 325 MG tablet Take 325 mg by mouth as needed for mild pain, moderate pain, fever or headache.     Marland Kitchen amLODipine (NORVASC) 5 MG tablet TAKE ONE (1) TABLET BY MOUTH EVERY DAY 90 tablet 0  . Aspirin-Acetaminophen-Caffeine (GOODYS EXTRA STRENGTH) 520-260-32.5 MG PACK     . benzonatate (TESSALON PERLES) 100 MG capsule 1 tab by mouth three times per day as needed 40 capsule 2  . Blood Glucose Monitoring Suppl (ONE TOUCH ULTRA 2) w/Device KIT Use as directed 1 each 0  .  Cyanocobalamin (VITAMIN B-12 PO) Take by mouth.    Marland Kitchen glimepiride (AMARYL) 2 MG tablet TAKE ONETABLET BY MOUTH DAILY 90 tablet 3  . Lancets (ONETOUCH ULTRASOFT) lancets USE TO CHECK BLOOD SUGAR TWICE DAILY 100 each 3  . linaclotide (LINZESS) 72 MCG capsule Take 1 capsule (72 mcg total) by mouth daily before breakfast. 30 capsule 3  . loperamide (IMODIUM) 2 MG capsule Take by mouth as needed for diarrhea or loose stools.    Marland Kitchen LORazepam (ATIVAN) 0.5 MG tablet TAKE 1 TABLET BY MOUTH 2 TIMES DAILY AS NEEDED FOR ANXIETY 60 tablet 2  . ONETOUCH ULTRA test strip USE TO CHECK BLOOD SUGARS TWO TIMES DAILY 100 each 3  . Psyllium (METAMUCIL FIBER PO) Take by mouth.    .  solifenacin (VESICARE) 5 MG tablet Take 1 tablet (5 mg total) by mouth daily. 90 tablet 3  . traMADol (ULTRAM) 50 MG tablet     . Vitamin D, Ergocalciferol, (DRISDOL) 1.25 MG (50000 UT) CAPS capsule Take 1 capsule (50,000 Units total) by mouth every 7 (seven) days. 12 capsule 0   No current facility-administered medications on file prior to visit.    Review of Systems  Constitutional: Negative for other unusual diaphoresis or sweats HENT: Negative for ear discharge or swelling Eyes: Negative for other worsening visual disturbances Respiratory: Negative for stridor or other swelling  Gastrointestinal: Negative for worsening distension or other blood Genitourinary: Negative for retention or other urinary change Musculoskeletal: Negative for other MSK pain or swelling Skin: Negative for color change or other new lesions Neurological: Negative for worsening tremors and other numbness  Psychiatric/Behavioral: Negative for worsening agitation or other fatigue All otherwise neg per pt     Objective:   Physical Exam BP 124/84   Pulse 87   Temp 98.4 F (36.9 C) (Oral)   Ht 5' 4"  (1.626 m)   Wt 200 lb (90.7 kg)   SpO2 98%   BMI 34.33 kg/m  VS noted,  Constitutional: Pt appears in NAD HENT: Head: NCAT.  Right Ear: External ear normal.  Left Ear: External ear normal.  Eyes: . Pupils are equal, round, and reactive to light. Conjunctivae and EOM are normal Nose: without d/c or deformity Neck: Neck supple. Gross normal ROM Cardiovascular: Normal rate and regular rhythm.   Pulmonary/Chest: Effort normal and breath sounds without rales or wheezing.  Neurological: Pt is alert. At baseline orientation, motor grossly intact Skin: Skin is warm. No rashes, other new lesions, no LE edema Psychiatric: Pt behavior is normal without agitation  Right great toe with 2+ red, tender, swelling All otherwise neg per pt Lab Results  Component Value Date   WBC 8.9 03/09/2018   HGB 13.6 03/09/2018    HCT 40.5 03/09/2018   PLT 285.0 03/09/2018   GLUCOSE 111 (H) 08/25/2018   CHOL 281 (H) 08/25/2018   TRIG 238.0 (H) 08/25/2018   HDL 52.20 08/25/2018   LDLDIRECT 207.0 08/25/2018   LDLCALC 166 (H) 04/12/2014   ALT 16 08/25/2018   AST 16 08/25/2018   NA 140 08/25/2018   K 4.1 08/25/2018   CL 106 08/25/2018   CREATININE 0.90 08/25/2018   BUN 13 08/25/2018   CO2 25 08/25/2018   TSH 1.65 03/09/2018   HGBA1C 7.3 (H) 08/25/2018   MICROALBUR 0.7 03/09/2018      Assessment & Plan:

## 2018-12-18 ENCOUNTER — Encounter: Payer: Self-pay | Admitting: Internal Medicine

## 2018-12-18 NOTE — Assessment & Plan Note (Signed)
stable overall by history and exam, recent data reviewed with pt, and pt to continue medical treatment as before,  to f/u any worsening symptoms or concerns  

## 2018-12-18 NOTE — Assessment & Plan Note (Signed)
Reassured, cont current med tx

## 2018-12-18 NOTE — Assessment & Plan Note (Addendum)
mod, for depomedrol IM, predpac asd, indocin prn,  to f/u any worsening symptoms or concerns  Note:  Total time for pt hx, exam, review of record with pt in the room, determination of diagnoses and plan for further eval and tx is > 40 min, with over 50% spent in coordination and counseling of patient including the differential dx, tx, further evaluation and other management of acute gouty arthritis, OAB, HTN, HLD, vit d deficiency, anxiety

## 2018-12-18 NOTE — Assessment & Plan Note (Signed)
Ok to [employer] vit 2000 u qd

## 2018-12-18 NOTE — Assessment & Plan Note (Signed)
D/w pt, declines increased vesicare after trying 10 mg daily of her supply at home fo the 5  mg's

## 2018-12-20 ENCOUNTER — Ambulatory Visit: Payer: Medicare Other | Attending: Family Medicine | Admitting: Physical Therapy

## 2018-12-20 ENCOUNTER — Other Ambulatory Visit: Payer: Self-pay

## 2018-12-20 DIAGNOSIS — G8929 Other chronic pain: Secondary | ICD-10-CM | POA: Insufficient documentation

## 2018-12-20 DIAGNOSIS — M25512 Pain in left shoulder: Secondary | ICD-10-CM | POA: Diagnosis not present

## 2018-12-20 DIAGNOSIS — M542 Cervicalgia: Secondary | ICD-10-CM | POA: Diagnosis not present

## 2018-12-20 NOTE — Therapy (Signed)
[facility] [place], [place], [address] Phone: [phone]   Fax:  [phone]  Physical Therapy Treatment  Patient Details  Name: Laura [name] MRN: [id number] Date of Birth: [birth year] Referring Provider (PT): Eunice Blase MD   Encounter Date: 12/20/2018  PT End of Session - 12/20/18 1221    Visit Number  9    Number of Visits  12    Date for PT Re-Evaluation  02/14/19    Authorization Type  PROGRESS NOTE AT 10TH VISIT.  KX MODIFIER AFTER 15 VISITS.    PT Start Time  1120    PT Stop Time  1212    PT Time Calculation (min)  52 min    Activity Tolerance  Patient tolerated treatment well    Behavior During Therapy  WFL for tasks assessed/performed       Past Medical History:  Diagnosis Date  . Anxiety   . Arthritis    back of neck, bones spurs on neck  . Cataract   . Cervical disc disease   . Diabetes mellitus   . Hyperlipidemia   . Hypertension   . Mucoid cyst of joint    right thumb  . Neuropathy   . Reflux   . Sleep apnea    wears CPAP nightly  . Vertigo     Past Surgical History:  Procedure Laterality Date  . BLADDER SURGERY    . BREAST EXCISIONAL BIOPSY Bilateral   . BREAST SURGERY    . CHOLECYSTECTOMY    . fibroids removed     breast (both breasts)  . MASS EXCISION Right 06/26/2016   Procedure: EXCISION MUCOID TUMOR RIGHT THUMB, IP RIGHT THUMB;  Surgeon: Daryll Brod, MD;  Location: [facility];  Service: Orthopedics;  Laterality: Right;  . PARTIAL HYSTERECTOMY      There were no vitals filed for this visit.  Subjective Assessment - 12/20/18 1219    Subjective  COVID-19 screen performed prior to patient entering clinic.  Neck better after last treatment.  Had to see doctor for Gout in right big toe though.    Pertinent History  HTN, DM, cervical disc disease.    Patient Stated Goals  Get out of pain.    Currently in Pain?  Yes    Pain Score  2     Pain Location  Neck    Pain  Orientation  Posterior    Pain Descriptors / Indicators  Discomfort    Pain Onset  More than a month ago                       [facility] Adult PT Treatment/Exercise - 12/20/18 0001      Modalities   Modalities  Electrical Stimulation;Moist Heat;Ultrasound      Moist Heat Therapy   Number Minutes Moist Heat  20 Minutes    Moist Heat Location  --   LT UT.     Acupuncturist Location  Left UT    Electrical Stimulation Action  IFC    Electrical Stimulation Parameters  80-150 Hz x 20 minutes.    Electrical Stimulation Goals  Tone;Pain      Ultrasound   Ultrasound Location  LT UT.    Ultrasound Parameters  Combo e'stim/U/S at 1.50 W/CM2 x 12 minutes.    Ultrasound Goals  Pain      Manual Therapy   Manual Therapy  Soft tissue mobilization  Soft tissue mobilization  STW/M x 11 minutes to patient's left UT.                  PT Long Term Goals - 12/13/18 1315      PT LONG TERM GOAL #1   Title  Perform ADL's with pain not > 3/10.    Time  6    Period  Weeks    Status  On-going      PT LONG TERM GOAL #2   Title  Active left shoulder flexion to 125 degrees so the patient can easily reach overhead    Time  6    Period  Weeks    Status  Achieved      PT LONG TERM GOAL #3   Title  Left shoulder ER to 65 degrees.    Time  6    Period  Weeks    Status  On-going      PT LONG TERM GOAL #4   Title  Reach behind back with left hand to L3.    Time  6    Period  Weeks    Status  On-going   L glute 12/13/2018     PT LONG TERM GOAL #5   Title  Left active cervical rotation to 70 degrees.    Time  6    Period  Weeks    Status  On-going            Plan - 12/20/18 1222    Clinical Impression Statement  Excellent job today with left pain over left UT.  Continued tone in region but she is responding very well to teratments.    Comorbidities  HTN, DM, cervical disc disease.    Examination-Participation Restrictions   Other    Stability/Clinical Decision Making  Evolving/Moderate complexity    Rehab Potential  Good    PT Frequency  2x / week    PT Treatment/Interventions  ADLs/Self Care Home Management;Cryotherapy;Electrical Stimulation;Ultrasound;Traction;Moist Heat;Therapeutic activities;Therapeutic exercise;Manual techniques;Patient/family education;Passive range of motion;Dry needling;Joint Manipulations    PT Next Visit Plan  Assess cervical symptoms and LBP next treatment.    Consulted and Agree with Plan of Care  Patient       Patient will benefit from skilled therapeutic intervention in order to improve the following deficits and impairments:  Pain, Postural dysfunction, Decreased activity tolerance, Decreased range of motion, Increased muscle spasms  Visit Diagnosis: Chronic left shoulder pain  Cervicalgia     Problem List Patient Active Problem List   Diagnosis Date Noted  . Acute gouty arthritis 12/17/2018  . Bilateral hearing loss 12/21/2017  . Eustachian tube disorder, bilateral 12/21/2017  . Cough 02/23/2017  . Allergic conjunctivitis 12/12/2016  . Left hand pain 05/26/2016  . Mucoid cyst, joint 05/26/2016  . Pain in finger of right hand 05/26/2016  . Primary osteoarthritis of both first carpometacarpal joints 05/26/2016  . Skin avulsion 05/26/2016  . Adhesive capsulitis of left shoulder 03/20/2016  . Fatigue 12/16/2015  . Acute bronchitis 10/31/2015  . Urinary frequency 03/09/2015  . Hearing loss, sensorineural, asymmetrical 11/09/2014  . Vestibular hypofunction, left 11/09/2014  . Morbid obesity ([place]) 07/25/2014  . Orthostatic hypotension 07/07/2014  . Upper airway cough syndrome 06/26/2014  . Diarrhea 05/02/2014  . Dizziness 04/12/2014  . Neuropathy   . Rash and nonspecific skin eruption 08/30/2013  . Paresthesia of both feet 08/30/2013  . Blurred vision 02/15/2013  . Eye pain 02/15/2013  . Sinusitis, chronic 10/07/2012  . Left shoulder  pain 04/15/2012  .  Preventative health care 10/22/2011  . Cervical disc disease 10/22/2011  . Bilateral hand pain 08/05/2011  . SNHL (sensorineural hearing loss) 08/04/2011  . Fibrocystic breast disease 07/02/2011  . Hypertension 07/01/2011  . GERD (gastroesophageal reflux disease) 06/29/2011  . Bloating 06/29/2011  . Back pain 03/13/2011  . Vertigo 01/04/2011  . Nystagmus 01/04/2011  . Insomnia 10/21/2010  . CONSTIPATION 08/17/2009  . Vitamin D deficiency 05/15/2009  . DYSPNEA 03/13/2009  . Depression with anxiety 01/16/2009  . Irritable bowel syndrome 01/16/2009  . Primary osteoarthritis of both hands 12/15/2008  . Diabetes ([place]) 11/03/2008  . Hyperlipidemia 11/03/2008  . Anxiety state 11/03/2008  . OVERACTIVE BLADDER 11/03/2008  . OSA (obstructive sleep apnea) 11/03/2008  . MURMUR 11/03/2008    Ikram Riebe, [place] MPT 12/20/2018, 12:24 PM  [facility] [address] [place], [place], [address] Phone: [phone]   Fax:  [phone]  Name: Laura [name] MRN: [id number] Date of Birth: 10/19/1945

## 2018-12-24 ENCOUNTER — Ambulatory Visit: Payer: Medicare Other | Admitting: *Deleted

## 2018-12-28 ENCOUNTER — Encounter: Payer: Self-pay | Admitting: *Deleted

## 2018-12-28 ENCOUNTER — Ambulatory Visit: Payer: Medicare Other | Admitting: *Deleted

## 2018-12-28 ENCOUNTER — Other Ambulatory Visit: Payer: Self-pay

## 2018-12-28 DIAGNOSIS — M542 Cervicalgia: Secondary | ICD-10-CM | POA: Diagnosis not present

## 2018-12-28 DIAGNOSIS — G8929 Other chronic pain: Secondary | ICD-10-CM | POA: Diagnosis not present

## 2018-12-28 DIAGNOSIS — M25512 Pain in left shoulder: Secondary | ICD-10-CM | POA: Diagnosis not present

## 2018-12-28 NOTE — Therapy (Addendum)
[facility] [place], [place], [address] Phone: [phone]   Fax:  [phone]  Physical Therapy Treatment  Patient Details  Name: Laura [name] MRN: [id number] Date of Birth: [birth year] Referring Provider (PT): Eunice Blase MD   Encounter Date: 12/28/2018  PT End of Session - 12/28/18 0950    Visit Number  10    Number of Visits  12    Date for PT Re-Evaluation  02/14/19    Authorization Type  PROGRESS NOTE AT 10TH VISIT.  KX MODIFIER AFTER 15 VISITS.    PT Start Time  0945    PT Stop Time  1036    PT Time Calculation (min)  51 min       Past Medical History:  Diagnosis Date  . Anxiety   . Arthritis    back of neck, bones spurs on neck  . Cataract   . Cervical disc disease   . Diabetes mellitus   . Hyperlipidemia   . Hypertension   . Mucoid cyst of joint    right thumb  . Neuropathy   . Reflux   . Sleep apnea    wears CPAP nightly  . Vertigo     Past Surgical History:  Procedure Laterality Date  . BLADDER SURGERY    . BREAST EXCISIONAL BIOPSY Bilateral   . BREAST SURGERY    . CHOLECYSTECTOMY    . fibroids removed     breast (both breasts)  . MASS EXCISION Right 06/26/2016   Procedure: EXCISION MUCOID TUMOR RIGHT THUMB, IP RIGHT THUMB;  Surgeon: Daryll Brod, MD;  Location: [facility];  Service: Orthopedics;  Laterality: Right;  . PARTIAL HYSTERECTOMY      There were no vitals filed for this visit.  Subjective Assessment - 12/28/18 0947    Subjective  COVID-19 screen performed prior to patient entering clinic.  Neck better after last treatment.    Pertinent History  HTN, DM, cervical disc disease.    Patient Stated Goals  Get out of pain.    Currently in Pain?  Yes    Pain Score  2     Pain Location  Neck    Pain Orientation  Posterior    Pain Descriptors / Indicators  Discomfort    Pain Type  Chronic pain    Pain Onset  More than a month ago                        [facility] Adult PT Treatment/Exercise - 12/28/18 0001      Modalities   Modalities  Electrical Stimulation;Moist Heat;Ultrasound      Ultrasound   Ultrasound Location  LT UT    Ultrasound Parameters  Combo US/Estim x 12 mins 1.5 w/cm2     Ultrasound Goals  Pain      Traction   Type of Traction  Cervical    Min (lbs)  5    Max (lbs)  12    Hold Time  99    Rest Time  5    Time  15      Manual Therapy   Manual Therapy  Soft tissue mobilization    Manual therapy comments  seated    Soft tissue mobilization  STW/TPR to L UT/ Levator to reduce pain                  PT Long Term Goals - 12/13/18 1315  PT LONG TERM GOAL #1   Title  Perform ADL's with pain not > 3/10.    Time  6    Period  Weeks    Status  On-going      PT LONG TERM GOAL #2   Title  Active left shoulder flexion to 125 degrees so the patient can easily reach overhead    Time  6    Period  Weeks    Status  Achieved      PT LONG TERM GOAL #3   Title  Left shoulder ER to 65 degrees.    Time  6    Period  Weeks    Status  On-going      PT LONG TERM GOAL #4   Title  Reach behind back with left hand to L3.    Time  6    Period  Weeks    Status  On-going   L glute 12/13/2018     PT LONG TERM GOAL #5   Title  Left active cervical rotation to 70 degrees.    Time  6    Period  Weeks    Status  On-going            Plan - 12/28/18 0949    Clinical Impression Statement  Pt arrived today doing some better with LTshldr and wanted to start traction back for her neck. She did well with [place] and STW to LT UT with decreased tone after Rx.Traction was performed at 12 degrees today and tolerated well Progressing towards LTGs, but not met due to ROM deficits still in neck and shldr.    Comorbidities  HTN, DM, cervical disc disease.    Examination-Activity Limitations  Reach Overhead;Other;Lift    Examination-Participation Restrictions  Other    Stability/Clinical  Decision Making  Evolving/Moderate complexity    Rehab Potential  Good    PT Frequency  2x / week    PT Duration  6 weeks    PT Treatment/Interventions  ADLs/Self Care Home Management;Cryotherapy;Electrical Stimulation;Ultrasound;Traction;Moist Heat;Therapeutic activities;Therapeutic exercise;Manual techniques;Patient/family education;Passive range of motion;Dry needling;Joint Manipulations    PT Next Visit Plan  Assess cervical symptoms and LBP next treatment.    Consulted and Agree with Plan of Care  Patient       Patient will benefit from skilled therapeutic intervention in order to improve the following deficits and impairments:  Pain, Postural dysfunction, Decreased activity tolerance, Decreased range of motion, Increased muscle spasms  Visit Diagnosis: Chronic left shoulder pain  Cervicalgia     Problem List Patient Active Problem List   Diagnosis Date Noted  . Acute gouty arthritis 12/17/2018  . Bilateral hearing loss 12/21/2017  . Eustachian tube disorder, bilateral 12/21/2017  . Cough 02/23/2017  . Allergic conjunctivitis 12/12/2016  . Left hand pain 05/26/2016  . Mucoid cyst, joint 05/26/2016  . Pain in finger of right hand 05/26/2016  . Primary osteoarthritis of both first carpometacarpal joints 05/26/2016  . Skin avulsion 05/26/2016  . Adhesive capsulitis of left shoulder 03/20/2016  . Fatigue 12/16/2015  . Acute bronchitis 10/31/2015  . Urinary frequency 03/09/2015  . Hearing loss, sensorineural, asymmetrical 11/09/2014  . Vestibular hypofunction, left 11/09/2014  . Morbid obesity ([place]) 07/25/2014  . Orthostatic hypotension 07/07/2014  . Upper airway cough syndrome 06/26/2014  . Diarrhea 05/02/2014  . Dizziness 04/12/2014  . Neuropathy   . Rash and nonspecific skin eruption 08/30/2013  . Paresthesia of both feet 08/30/2013  . Blurred vision 02/15/2013  . Eye pain 02/15/2013  .  Sinusitis, chronic 10/07/2012  . Left shoulder pain 04/15/2012  .  Preventative health care 10/22/2011  . Cervical disc disease 10/22/2011  . Bilateral hand pain 08/05/2011  . SNHL (sensorineural hearing loss) 08/04/2011  . Fibrocystic breast disease 07/02/2011  . Hypertension 07/01/2011  . GERD (gastroesophageal reflux disease) 06/29/2011  . Bloating 06/29/2011  . Back pain 03/13/2011  . Vertigo 01/04/2011  . Nystagmus 01/04/2011  . Insomnia 10/21/2010  . CONSTIPATION 08/17/2009  . Vitamin D deficiency 05/15/2009  . DYSPNEA 03/13/2009  . Depression with anxiety 01/16/2009  . Irritable bowel syndrome 01/16/2009  . Primary osteoarthritis of both hands 12/15/2008  . Diabetes ([place]) 11/03/2008  . Hyperlipidemia 11/03/2008  . Anxiety state 11/03/2008  . OVERACTIVE BLADDER 11/03/2008  . OSA (obstructive sleep apnea) 11/03/2008  . MURMUR 11/03/2008    Diasia Henken,CHRIS, PTA 12/28/2018, 8:20 PM  [facility] [place], [place], [address] Phone: [phone]   Fax:  [phone]  Name: DALENA PLANTZ MRN: [id number] Date of Birth: 11/25/1945  Progress Note Reporting Period 11/16/18 to 12/28/18  See note below for Objective Data and Assessment of Progress/Goals. Patient reporting overall improvement.  Patient has met LTG #2 currently.    Mali Applegate MPT

## 2018-12-29 ENCOUNTER — Telehealth: Payer: Self-pay

## 2018-12-29 NOTE — Telephone Encounter (Signed)
This should be ok for now, as the sugars will trend back to her baseline soon after the steroid tx

## 2018-12-29 NOTE — Telephone Encounter (Signed)
Copied from [place] [id number]. Topic: General - Inquiry >> Dec 29, 2018  3:33 PM Laura [name] wrote: Reason for CRM: pt called in and stated that her sugar is at 232 but she is on the methylPREDNISolone (MEDROL DOSEPAK) 4 MG TBPK tablet [id number] , and she would like to know what she needs to do or should she be worried  Best number  [phone]

## 2018-12-30 NOTE — Telephone Encounter (Signed)
Pt has been informed.

## 2018-12-31 ENCOUNTER — Other Ambulatory Visit: Payer: Self-pay

## 2018-12-31 ENCOUNTER — Encounter: Payer: Self-pay | Admitting: Physical Therapy

## 2018-12-31 ENCOUNTER — Ambulatory Visit: Payer: Medicare Other | Admitting: Physical Therapy

## 2018-12-31 DIAGNOSIS — G8929 Other chronic pain: Secondary | ICD-10-CM

## 2018-12-31 DIAGNOSIS — M542 Cervicalgia: Secondary | ICD-10-CM | POA: Diagnosis not present

## 2018-12-31 DIAGNOSIS — M25512 Pain in left shoulder: Secondary | ICD-10-CM | POA: Diagnosis not present

## 2018-12-31 NOTE — Therapy (Signed)
[facility] [place], [place], [address] Phone: [phone]   Fax:  [phone]  Physical Therapy Treatment  Patient Details  Name: Laura [name] MRN: [id number] Date of Birth: 07/27/1945 Referring Provider (PT): Eunice Blase MD   Encounter Date: 12/31/2018  PT End of Session - 12/31/18 1036    Visit Number  11    Number of Visits  12    Date for PT Re-Evaluation  02/14/19    Authorization Type  PROGRESS NOTE AT 10TH VISIT.  KX MODIFIER AFTER 15 VISITS.    PT Start Time  1036    PT Stop Time  1120    PT Time Calculation (min)  44 min    Activity Tolerance  Patient tolerated treatment well    Behavior During Therapy  WFL for tasks assessed/performed       Past Medical History:  Diagnosis Date  . Anxiety   . Arthritis    back of neck, bones spurs on neck  . Cataract   . Cervical disc disease   . Diabetes mellitus   . Hyperlipidemia   . Hypertension   . Mucoid cyst of joint    right thumb  . Neuropathy   . Reflux   . Sleep apnea    wears CPAP nightly  . Vertigo     Past Surgical History:  Procedure Laterality Date  . BLADDER SURGERY    . BREAST EXCISIONAL BIOPSY Bilateral   . BREAST SURGERY    . CHOLECYSTECTOMY    . fibroids removed     breast (both breasts)  . MASS EXCISION Right 06/26/2016   Procedure: EXCISION MUCOID TUMOR RIGHT THUMB, IP RIGHT THUMB;  Surgeon: Daryll Brod, MD;  Location: [facility];  Service: Orthopedics;  Laterality: Right;  . PARTIAL HYSTERECTOMY      There were no vitals filed for this visit.  Subjective Assessment - 12/31/18 1035    Subjective  COVID-19 screen performed prior to patient entering clinic.  Neck better after last treatment. Still have tingling but very occasional down LUE to fingers.    Pertinent History  HTN, DM, cervical disc disease.    Patient Stated Goals  Get out of pain.    Currently in Pain?  Yes    Pain Score  --   No numerical pain  score provided by patient   Pain Location  Neck    Pain Orientation  Posterior    Pain Descriptors / Indicators  Discomfort    Pain Type  Chronic pain    Pain Onset  More than a month ago    Pain Frequency  Intermittent         [facility] PT Assessment - 12/31/18 0001      Assessment   Medical Diagnosis  Chronic left shoulder and neck pain.    Referring Provider (PT)  Eunice Blase MD      Precautions   Precautions  None      Restrictions   Weight Bearing Restrictions  No                   [facility] Adult PT Treatment/Exercise - 12/31/18 0001      Modalities   Modalities  Electrical Stimulation;Moist Heat;Ultrasound      Ultrasound   Ultrasound Location  L UT    Ultrasound Parameters  Combo 1.5 w/cm2, 100%, 1 mhz x10 min    Ultrasound Goals  Pain      Traction  Type of Traction  Cervical    Min (lbs)  5    Max (lbs)  13    Hold Time  99    Rest Time  5    Time  15      Manual Therapy   Manual Therapy  Soft tissue mobilization    Soft tissue mobilization  STW/TPR to L UT/ Levator to reduce pain                  PT Long Term Goals - 12/13/18 1315      PT LONG TERM GOAL #1   Title  Perform ADL's with pain not > 3/10.    Time  6    Period  Weeks    Status  On-going      PT LONG TERM GOAL #2   Title  Active left shoulder flexion to 125 degrees so the patient can easily reach overhead    Time  6    Period  Weeks    Status  Achieved      PT LONG TERM GOAL #3   Title  Left shoulder ER to 65 degrees.    Time  6    Period  Weeks    Status  On-going      PT LONG TERM GOAL #4   Title  Reach behind back with left hand to L3.    Time  6    Period  Weeks    Status  On-going   L glute 12/13/2018     PT LONG TERM GOAL #5   Title  Left active cervical rotation to 70 degrees.    Time  6    Period  Weeks    Status  On-going            Plan - 12/31/18 1118    Clinical Impression Statement  Patient presented in clinic with reports of  concern as her cervical pain is better but still has occasional tingling down LUE to L thumb. Mininal increased tone of L UT palpable and no complaints of tenderness during manual therapy. Normal modalties response noted following removal of the modalities.    Personal Factors and Comorbidities  Comorbidity 1;Comorbidity 2    Comorbidities  HTN, DM, cervical disc disease.    Examination-Activity Limitations  Reach Overhead;Other;Lift    Examination-Participation Restrictions  Other    Stability/Clinical Decision Making  Evolving/Moderate complexity    Rehab Potential  Good    PT Frequency  2x / week    PT Duration  6 weeks    PT Treatment/Interventions  ADLs/Self Care Home Management;Cryotherapy;Electrical Stimulation;Ultrasound;Traction;Moist Heat;Therapeutic activities;Therapeutic exercise;Manual techniques;Patient/family education;Passive range of motion;Dry needling;Joint Manipulations    PT Next Visit Plan  Assess cervical symptoms and LBP next treatment.    Consulted and Agree with Plan of Care  Patient       Patient will benefit from skilled therapeutic intervention in order to improve the following deficits and impairments:  Pain, Postural dysfunction, Decreased activity tolerance, Decreased range of motion, Increased muscle spasms  Visit Diagnosis: Chronic left shoulder pain  Cervicalgia     Problem List Patient Active Problem List   Diagnosis Date Noted  . Acute gouty arthritis 12/17/2018  . Bilateral hearing loss 12/21/2017  . Eustachian tube disorder, bilateral 12/21/2017  . Cough 02/23/2017  . Allergic conjunctivitis 12/12/2016  . Left hand pain 05/26/2016  . Mucoid cyst, joint 05/26/2016  . Pain in finger of right hand 05/26/2016  . Primary osteoarthritis  of both first carpometacarpal joints 05/26/2016  . Skin avulsion 05/26/2016  . Adhesive capsulitis of left shoulder 03/20/2016  . Fatigue 12/16/2015  . Acute bronchitis 10/31/2015  . Urinary frequency  03/09/2015  . Hearing loss, sensorineural, asymmetrical 11/09/2014  . Vestibular hypofunction, left 11/09/2014  . Morbid obesity ([place]) 07/25/2014  . Orthostatic hypotension 07/07/2014  . Upper airway cough syndrome 06/26/2014  . Diarrhea 05/02/2014  . Dizziness 04/12/2014  . Neuropathy   . Rash and nonspecific skin eruption 08/30/2013  . Paresthesia of both feet 08/30/2013  . Blurred vision 02/15/2013  . Eye pain 02/15/2013  . Sinusitis, chronic 10/07/2012  . Left shoulder pain 04/15/2012  . Preventative health care 10/22/2011  . Cervical disc disease 10/22/2011  . Bilateral hand pain 08/05/2011  . SNHL (sensorineural hearing loss) 08/04/2011  . Fibrocystic breast disease 07/02/2011  . Hypertension 07/01/2011  . GERD (gastroesophageal reflux disease) 06/29/2011  . Bloating 06/29/2011  . Back pain 03/13/2011  . Vertigo 01/04/2011  . Nystagmus 01/04/2011  . Insomnia 10/21/2010  . CONSTIPATION 08/17/2009  . Vitamin D deficiency 05/15/2009  . DYSPNEA 03/13/2009  . Depression with anxiety 01/16/2009  . Irritable bowel syndrome 01/16/2009  . Primary osteoarthritis of both hands 12/15/2008  . Diabetes ([place]) 11/03/2008  . Hyperlipidemia 11/03/2008  . Anxiety state 11/03/2008  . OVERACTIVE BLADDER 11/03/2008  . OSA (obstructive sleep apnea) 11/03/2008  . MURMUR 11/03/2008    Standley Brooking, PTA 12/31/2018, 12:25 PM  [facility] [address] [place], [place], [address] Phone: [phone]   Fax:  [phone]  Name: Laura [name] MRN: [id number] Date of Birth: 05-19-1945

## 2019-01-04 ENCOUNTER — Ambulatory Visit: Payer: Medicare Other | Admitting: Physical Therapy

## 2019-01-04 ENCOUNTER — Other Ambulatory Visit: Payer: Self-pay

## 2019-01-04 ENCOUNTER — Encounter: Payer: Self-pay | Admitting: Gastroenterology

## 2019-01-04 ENCOUNTER — Ambulatory Visit (INDEPENDENT_AMBULATORY_CARE_PROVIDER_SITE_OTHER): Payer: Medicare Other | Admitting: Gastroenterology

## 2019-01-04 VITALS — BP 130/82 | HR 86 | Temp 98.3°F | Ht 64.0 in | Wt 196.0 lb

## 2019-01-04 DIAGNOSIS — G8929 Other chronic pain: Secondary | ICD-10-CM

## 2019-01-04 DIAGNOSIS — M542 Cervicalgia: Secondary | ICD-10-CM | POA: Diagnosis not present

## 2019-01-04 DIAGNOSIS — M25512 Pain in left shoulder: Secondary | ICD-10-CM | POA: Diagnosis not present

## 2019-01-04 DIAGNOSIS — K582 Mixed irritable bowel syndrome: Secondary | ICD-10-CM | POA: Diagnosis not present

## 2019-01-04 NOTE — Progress Notes (Signed)
Review of pertinent gastrointestinal problems: 1. IBS Alternating Constipation/D: functional 2. Adenomatous polyp: 07/2011 Colonoscopy Dr.  for constipation; single small TA (subCM), recommended recall colonoscopy at 5 years.Colonoscopy 09/2016 was normal. Recommended 10 year screening.   HPI: This is a very pleasant 73-year-old woman whom I last saw about 6 weeks ago.  At the time of her last visit early November 2020 she was really cycling between constipation and diarrhea pretty rapidly.  I felt that her daily fiber supplements might actually be working against her so I recommended she stop taking those.  Trial of low-dose Linzess was started which was not successful.  Instead for the past 2 or 3 weeks she has been taking a single Imodium shortly after she wakes up every morning.  She is very happy about her new bowel regimen.  She is taking Imodium every day or every other day.  Sometimes she cuts it in half if she feels that is all she needs.  On this single agent bowel therapy she is feeling much better.  She tells me she is sleeping better and she has more energy.  She is not afraid to venture out of the house any longer.  Chief complaint is alternating IBS  ROS: complete GI ROS as described in HPI, all other review negative.  Constitutional:  No unintentional weight loss   Past Medical History:  Diagnosis Date  . Anxiety   . Arthritis    back of neck, bones spurs on neck  . Cataract   . Cervical disc disease   . Diabetes mellitus   . Hyperlipidemia   . Hypertension   . Mucoid cyst of joint    right thumb  . Neuropathy   . Reflux   . Sleep apnea    wears CPAP nightly  . Vertigo     Past Surgical History:  Procedure Laterality Date  . BLADDER SURGERY    . BREAST EXCISIONAL BIOPSY Bilateral   . BREAST SURGERY    . CHOLECYSTECTOMY    . fibroids removed     breast (both breasts)  . MASS EXCISION Right 06/26/2016   Procedure: EXCISION MUCOID TUMOR RIGHT THUMB,  IP RIGHT THUMB;  Surgeon: Kuzma, Gary, MD;  Location: [facility];  Service: Orthopedics;  Laterality: Right;  . PARTIAL HYSTERECTOMY      Current Outpatient Medications  Medication Sig Dispense Refill  . acetaminophen (TYLENOL) 325 MG tablet Take 325 mg by mouth as needed for mild pain, moderate pain, fever or headache.     . amLODipine (NORVASC) 5 MG tablet TAKE ONE (1) TABLET BY MOUTH EVERY DAY 90 tablet 0  . Aspirin-Acetaminophen-Caffeine (GOODYS EXTRA STRENGTH) 520-260-32.5 MG PACK     . benzonatate (TESSALON PERLES) 100 MG capsule 1 tab by mouth three times per day as needed 40 capsule 2  . Blood Glucose Monitoring Suppl (ONE TOUCH ULTRA 2) w/Device KIT Use as directed 1 each 0  . Cyanocobalamin (VITAMIN B-12 PO) Take by mouth.    . glimepiride (AMARYL) 2 MG tablet TAKE ONETABLET BY MOUTH DAILY 90 tablet 3  . indomethacin (INDOCIN) 50 MG capsule Take 1 capsule (50 mg total) by mouth 3 (three) times daily as needed for moderate pain. 90 capsule 1  . Lancets (ONETOUCH ULTRASOFT) lancets USE TO CHECK BLOOD SUGAR TWICE DAILY 100 each 3  . loperamide (IMODIUM) 2 MG capsule Take by mouth as needed for diarrhea or loose stools.    . LORazepam (ATIVAN) 0.5 MG tablet TAKE 1 TABLET   BY MOUTH 2 TIMES DAILY AS NEEDED FOR ANXIETY 60 tablet 2  . ONETOUCH ULTRA test strip USE TO CHECK BLOOD SUGARS TWO TIMES DAILY 100 each 3  . Psyllium (METAMUCIL FIBER PO) Take by mouth.    . traMADol (ULTRAM) 50 MG tablet     . Vitamin D, Ergocalciferol, (DRISDOL) 1.25 MG (50000 UT) CAPS capsule Take 1 capsule (50,000 Units total) by mouth every 7 (seven) days. 12 capsule 0   No current facility-administered medications for this visit.    Allergies as of 01/04/2019 - Review Complete 01/04/2019  Allergen Reaction Noted  . Hydrocodone-homatropine Other (See Comments) 04/19/2013  . Sulfa antibiotics  10/21/2010  . Augmentin [amoxicillin-pot clavulanate]  12/04/2017  . Codeine  10/21/2010  . Crestor  [rosuvastatin calcium] Other (See Comments) 10/23/2010  . Doxycycline  03/10/2017  . Keflex [cephalexin] Diarrhea and Nausea And Vomiting 06/20/2015  . Lipitor [atorvastatin calcium] Other (See Comments) 10/23/2010  . Naproxen Other (See Comments) 06/23/2016  . Prednisone Other (See Comments) 10/21/2010    Family History  Problem Relation Age of Onset  . Hypertension Other   . Diabetes Father   . Hypertension Father   . Diabetes Sister   . Hypertension Mother   . Hypertension Sister   . Stroke Sister   . Diabetes Maternal Aunt   . Colon cancer Neg Hx   . Esophageal cancer Neg Hx   . Stomach cancer Neg Hx   . Rectal cancer Neg Hx     Social History   Socioeconomic History  . Marital status: Divorced    Spouse name: Not on file  . Number of children: 4  . Years of education: 9  . Highest education level: Not on file  Occupational History  . Occupation: retired Geographical information systems officer  Tobacco Use  . Smoking status: Never Smoker  . Smokeless tobacco: Never Used  Substance and Sexual Activity  . Alcohol use: No    Alcohol/week: 0.0 standard drinks  . Drug use: No  . Sexual activity: Not Currently  Other Topics Concern  . Not on file  Social History Narrative  . Not on file   Social Determinants of Health   Financial Resource Strain:   . Difficulty of Paying Living Expenses: Not on file  Food Insecurity:   . Worried About Charity fundraiser in the Last Year: Not on file  . Ran Out of Food in the Last Year: Not on file  Transportation Needs:   . Lack of Transportation (Medical): Not on file  . Lack of Transportation (Non-Medical): Not on file  Physical Activity:   . Days of Exercise per Week: Not on file  . Minutes of Exercise per Session: Not on file  Stress:   . Feeling of Stress : Not on file  Social Connections:   . Frequency of Communication with Friends and Family: Not on file  . Frequency of Social Gatherings with Friends and Family: Not on file  .  Attends Religious Services: Not on file  . Active Member of Clubs or Organizations: Not on file  . Attends Archivist Meetings: Not on file  . Marital Status: Not on file  Intimate Partner Violence:   . Fear of Current or Ex-Partner: Not on file  . Emotionally Abused: Not on file  . Physically Abused: Not on file  . Sexually Abused: Not on file     Physical Exam: BP 130/82 (BP Location: Left Arm, Patient Position: Sitting)   Pulse 86  Temp 98.3 F (36.8 C)   Ht 5' 4" (1.626 m)   Wt 196 lb (88.9 kg)   SpO2 98%   BMI 33.64 kg/m  Constitutional: generally well-appearing Psychiatric: alert and oriented x3 Abdomen: soft, nontender, nondistended, no obvious ascites, no peritoneal signs, normal bowel sounds No peripheral edema noted in lower extremities  Assessment and plan: 73 y.o. female with alternating IBS  Seems that Imodium is helping quite well.  I commended her on the fact that she is willing to adapt her dosage to the way she needs it.  She is taking 1 pill every day or every other day and sometimes even split the pill in half to take only half a dose.  She will continue this indefinitely and if she runs into further trouble she will call.  Please see the "Patient Instructions" section for addition details about the plan.   , MD [facility] 01/04/2019, 3:10 PM   

## 2019-01-04 NOTE — Therapy (Signed)
[facility] [place], [place], [address] Phone: [phone]   Fax:  [phone]  Physical Therapy Treatment  Patient Details  Name: Laura [name] MRN: [id number] Date of Birth: 26-May-1945 Referring Provider (PT): Eunice Blase MD   Encounter Date: 01/04/2019  PT End of Session - 01/04/19 1407    Visit Number  12    Number of Visits  18    Date for PT Re-Evaluation  02/14/19    Authorization Type  PROGRESS NOTE AT 10TH VISIT.  KX MODIFIER AFTER 15 VISITS.    PT Start Time  0100    PT Stop Time  0156    PT Time Calculation (min)  56 min    Activity Tolerance  Patient tolerated treatment well    Behavior During Therapy  WFL for tasks assessed/performed       Past Medical History:  Diagnosis Date  . Anxiety   . Arthritis    back of neck, bones spurs on neck  . Cataract   . Cervical disc disease   . Diabetes mellitus   . Hyperlipidemia   . Hypertension   . Mucoid cyst of joint    right thumb  . Neuropathy   . Reflux   . Sleep apnea    wears CPAP nightly  . Vertigo     Past Surgical History:  Procedure Laterality Date  . BLADDER SURGERY    . BREAST EXCISIONAL BIOPSY Bilateral   . BREAST SURGERY    . CHOLECYSTECTOMY    . fibroids removed     breast (both breasts)  . MASS EXCISION Right 06/26/2016   Procedure: EXCISION MUCOID TUMOR RIGHT THUMB, IP RIGHT THUMB;  Surgeon: Daryll Brod, MD;  Location: [facility];  Service: Orthopedics;  Laterality: Right;  . PARTIAL HYSTERECTOMY      There were no vitals filed for this visit.  Subjective Assessment - 01/04/19 1408    Subjective  COVID-19 screen performed prior to patient entering clinic.  Would like to continue.  Still getting some tingling into left UE and fingers.    Pertinent History  HTN, DM, cervical disc disease.    Patient Stated Goals  Get out of pain.    Currently in Pain?  Yes    Pain Score  2     Pain Location  Neck    Pain  Orientation  Left    Pain Descriptors / Indicators  Discomfort    Pain Type  Chronic pain                       [facility] Adult PT Treatment/Exercise - 01/04/19 0001      Modalities   Modalities  --      Ultrasound   Ultrasound Location  Left UT.    Ultrasound Parameters  U/S at 1.50 W/CM2 x 12 minutes.    Ultrasound Goals  Pain      Traction   Type of Traction  Cervical    Min (lbs)  5    Max (lbs)  13    Hold Time  99    Rest Time  5    Time  15      Manual Therapy   Manual Therapy  Soft tissue mobilization    Soft tissue mobilization  STW/M x 12 minutes to left UT to reduce tone.                  PT Long  Term Goals - 12/13/18 1315      PT LONG TERM GOAL #1   Title  Perform ADL's with pain not > 3/10.    Time  6    Period  Weeks    Status  On-going      PT LONG TERM GOAL #2   Title  Active left shoulder flexion to 125 degrees so the patient can easily reach overhead    Time  6    Period  Weeks    Status  Achieved      PT LONG TERM GOAL #3   Title  Left shoulder ER to 65 degrees.    Time  6    Period  Weeks    Status  On-going      PT LONG TERM GOAL #4   Title  Reach behind back with left hand to L3.    Time  6    Period  Weeks    Status  On-going   L glute 12/13/2018     PT LONG TERM GOAL #5   Title  Left active cervical rotation to 70 degrees.    Time  6    Period  Weeks    Status  On-going            Plan - 01/04/19 1410    Clinical Impression Statement  Patient has done very well with PT.  She would like to continue with hopes of eliminating left UE and hand tingling.  She has less tone in her left UT.    Personal Factors and Comorbidities  Comorbidity 1;Comorbidity 2    Comorbidities  HTN, DM, cervical disc disease.    Examination-Activity Limitations  Reach Overhead;Other;Lift    Examination-Participation Restrictions  Other    Stability/Clinical Decision Making  Evolving/Moderate complexity    Rehab Potential   Good    PT Frequency  2x / week    PT Duration  6 weeks    PT Treatment/Interventions  ADLs/Self Care Home Management;Cryotherapy;Electrical Stimulation;Ultrasound;Traction;Moist Heat;Therapeutic activities;Therapeutic exercise;Manual techniques;Patient/family education;Passive range of motion;Dry needling;Joint Manipulations    PT Next Visit Plan  Assess cervical symptoms and LBP next treatment.    Consulted and Agree with Plan of Care  Patient       Patient will benefit from skilled therapeutic intervention in order to improve the following deficits and impairments:  Pain, Postural dysfunction, Decreased activity tolerance, Decreased range of motion, Increased muscle spasms  Visit Diagnosis: Chronic left shoulder pain - Plan: PT plan of care cert/re-cert  Cervicalgia - Plan: PT plan of care cert/re-cert     Problem List Patient Active Problem List   Diagnosis Date Noted  . Acute gouty arthritis 12/17/2018  . Bilateral hearing loss 12/21/2017  . Eustachian tube disorder, bilateral 12/21/2017  . Cough 02/23/2017  . Allergic conjunctivitis 12/12/2016  . Left hand pain 05/26/2016  . Mucoid cyst, joint 05/26/2016  . Pain in finger of right hand 05/26/2016  . Primary osteoarthritis of both first carpometacarpal joints 05/26/2016  . Skin avulsion 05/26/2016  . Adhesive capsulitis of left shoulder 03/20/2016  . Fatigue 12/16/2015  . Acute bronchitis 10/31/2015  . Urinary frequency 03/09/2015  . Hearing loss, sensorineural, asymmetrical 11/09/2014  . Vestibular hypofunction, left 11/09/2014  . Morbid obesity ([place]) 07/25/2014  . Orthostatic hypotension 07/07/2014  . Upper airway cough syndrome 06/26/2014  . Diarrhea 05/02/2014  . Dizziness 04/12/2014  . Neuropathy   . Rash and nonspecific skin eruption 08/30/2013  . Paresthesia of both feet 08/30/2013  .  Blurred vision 02/15/2013  . Eye pain 02/15/2013  . Sinusitis, chronic 10/07/2012  . Left shoulder pain 04/15/2012  .  Preventative health care 10/22/2011  . Cervical disc disease 10/22/2011  . Bilateral hand pain 08/05/2011  . SNHL (sensorineural hearing loss) 08/04/2011  . Fibrocystic breast disease 07/02/2011  . Hypertension 07/01/2011  . GERD (gastroesophageal reflux disease) 06/29/2011  . Bloating 06/29/2011  . Back pain 03/13/2011  . Vertigo 01/04/2011  . Nystagmus 01/04/2011  . Insomnia 10/21/2010  . CONSTIPATION 08/17/2009  . Vitamin D deficiency 05/15/2009  . DYSPNEA 03/13/2009  . Depression with anxiety 01/16/2009  . Irritable bowel syndrome 01/16/2009  . Primary osteoarthritis of both hands 12/15/2008  . Diabetes ([place]) 11/03/2008  . Hyperlipidemia 11/03/2008  . Anxiety state 11/03/2008  . OVERACTIVE BLADDER 11/03/2008  . OSA (obstructive sleep apnea) 11/03/2008  . MURMUR 11/03/2008    Letha Mirabal, [place] MPT 01/04/2019, 2:15 PM  [facility] [address] [place], [place], [address] Phone: [phone]   Fax:  [phone]  Name: Laura [name] MRN: [id number] Date of Birth: 12-Mar-1945

## 2019-01-04 NOTE — Patient Instructions (Signed)
Continue taking Loperamide/Imodium as needed.   Call office if symptoms worsen.   If you are age 74 or older, your body mass index should be between 23-30. Your Body mass index is 33.64 kg/m. If this is out of the aforementioned range listed, please consider follow up with your Primary Care Provider.  Thank you for choosing me and [facility].  Dr.Jacobs

## 2019-01-10 ENCOUNTER — Other Ambulatory Visit: Payer: Self-pay

## 2019-01-10 ENCOUNTER — Encounter: Payer: Self-pay | Admitting: Physical Therapy

## 2019-01-10 ENCOUNTER — Ambulatory Visit: Payer: Medicare Other | Admitting: Physical Therapy

## 2019-01-10 DIAGNOSIS — G8929 Other chronic pain: Secondary | ICD-10-CM | POA: Diagnosis not present

## 2019-01-10 DIAGNOSIS — M25512 Pain in left shoulder: Secondary | ICD-10-CM

## 2019-01-10 DIAGNOSIS — M542 Cervicalgia: Secondary | ICD-10-CM | POA: Diagnosis not present

## 2019-01-10 NOTE — Therapy (Signed)
[facility] [place], [place], [address] Phone: [phone]   Fax:  [phone]  Physical Therapy Treatment  Patient Details  Name: Laura [name] MRN: [id number] Date of Birth: 02/13/1945 Referring Provider (PT): Eunice Blase MD   Encounter Date: 01/10/2019  PT End of Session - 01/10/19 1034    Visit Number  13    Number of Visits  18    Date for PT Re-Evaluation  02/14/19    Authorization Type  PROGRESS NOTE AT 10TH VISIT.  KX MODIFIER AFTER 15 VISITS.    PT Start Time  1034    PT Stop Time  1118    PT Time Calculation (min)  44 min    Activity Tolerance  Patient tolerated treatment well    Behavior During Therapy  WFL for tasks assessed/performed       Past Medical History:  Diagnosis Date  . Anxiety   . Arthritis    back of neck, bones spurs on neck  . Cataract   . Cervical disc disease   . Diabetes mellitus   . Hyperlipidemia   . Hypertension   . Mucoid cyst of joint    right thumb  . Neuropathy   . Reflux   . Sleep apnea    wears CPAP nightly  . Vertigo     Past Surgical History:  Procedure Laterality Date  . BLADDER SURGERY    . BREAST EXCISIONAL BIOPSY Bilateral   . BREAST SURGERY    . CHOLECYSTECTOMY    . fibroids removed     breast (both breasts)  . MASS EXCISION Right 06/26/2016   Procedure: EXCISION MUCOID TUMOR RIGHT THUMB, IP RIGHT THUMB;  Surgeon: Daryll Brod, MD;  Location: [place];  Service: Orthopedics;  Laterality: Right;  . PARTIAL HYSTERECTOMY      There were no vitals filed for this visit.  Subjective Assessment - 01/10/19 1033    Subjective  COVID-19 screen performed prior to patient entering clinic. Reports some tenderness in B upper arms.    Pertinent History  HTN, DM, cervical disc disease.    Patient Stated Goals  Get out of pain.    Currently in Pain?  Other (Comment)   No pain assessment provided by patient        [facility] PT Assessment - 01/10/19  0001      Assessment   Medical Diagnosis  Chronic left shoulder and neck pain.    Referring Provider (PT)  Eunice Blase MD      Precautions   Precautions  None                   [facility] Adult PT Treatment/Exercise - 01/10/19 0001      Modalities   Modalities  Ultrasound;Traction      Ultrasound   Ultrasound Location  L UT    Ultrasound Parameters  Combo 1.5 w/cm2, 100%, 1 mhz x10 min    Ultrasound Goals  Pain      Traction   Type of Traction  Cervical    Min (lbs)  5    Max (lbs)  13    Hold Time  99    Rest Time  5    Time  15      Manual Therapy   Manual Therapy  Soft tissue mobilization    Soft tissue mobilization  STW/TPR to left UT to reduce tone.  PT Long Term Goals - 12/13/18 1315      PT LONG TERM GOAL #1   Title  Perform ADL's with pain not > 3/10.    Time  6    Period  Weeks    Status  On-going      PT LONG TERM GOAL #2   Title  Active left shoulder flexion to 125 degrees so the patient can easily reach overhead    Time  6    Period  Weeks    Status  Achieved      PT LONG TERM GOAL #3   Title  Left shoulder ER to 65 degrees.    Time  6    Period  Weeks    Status  On-going      PT LONG TERM GOAL #4   Title  Reach behind back with left hand to L3.    Time  6    Period  Weeks    Status  On-going   L glute 12/13/2018     PT LONG TERM GOAL #5   Title  Left active cervical rotation to 70 degrees.    Time  6    Period  Weeks    Status  On-going            Plan - 01/10/19 1107    Clinical Impression Statement  Patient presented in clinic with reports of some TPs in B deltoids and discomfort. Moderate muscle tone palpable in L UT upon assessment but no reports of tenderness or pain were reported during manual therapy session. Mechanical cervical traction maintained at 13# max per patient request.    Personal Factors and Comorbidities  Comorbidity 1;Comorbidity 2    Comorbidities  HTN, DM, cervical disc  disease.    Examination-Activity Limitations  Reach Overhead;Other;Lift    Examination-Participation Restrictions  Other    Stability/Clinical Decision Making  Evolving/Moderate complexity    Rehab Potential  Good    PT Frequency  2x / week    PT Duration  6 weeks    PT Treatment/Interventions  ADLs/Self Care Home Management;Cryotherapy;Electrical Stimulation;Ultrasound;Traction;Moist Heat;Therapeutic activities;Therapeutic exercise;Manual techniques;Patient/family education;Passive range of motion;Dry needling;Joint Manipulations    PT Next Visit Plan  Assess cervical symptoms next treatment.    Consulted and Agree with Plan of Care  Patient       Patient will benefit from skilled therapeutic intervention in order to improve the following deficits and impairments:  Pain, Postural dysfunction, Decreased activity tolerance, Decreased range of motion, Increased muscle spasms  Visit Diagnosis: Chronic left shoulder pain  Cervicalgia     Problem List Patient Active Problem List   Diagnosis Date Noted  . Acute gouty arthritis 12/17/2018  . Bilateral hearing loss 12/21/2017  . Eustachian tube disorder, bilateral 12/21/2017  . Cough 02/23/2017  . Allergic conjunctivitis 12/12/2016  . Left hand pain 05/26/2016  . Mucoid cyst, joint 05/26/2016  . Pain in finger of right hand 05/26/2016  . Primary osteoarthritis of both first carpometacarpal joints 05/26/2016  . Skin avulsion 05/26/2016  . Adhesive capsulitis of left shoulder 03/20/2016  . Fatigue 12/16/2015  . Acute bronchitis 10/31/2015  . Urinary frequency 03/09/2015  . Hearing loss, sensorineural, asymmetrical 11/09/2014  . Vestibular hypofunction, left 11/09/2014  . Morbid obesity ([place]) 07/25/2014  . Orthostatic hypotension 07/07/2014  . Upper airway cough syndrome 06/26/2014  . Diarrhea 05/02/2014  . Dizziness 04/12/2014  . Neuropathy   . Rash and nonspecific skin eruption 08/30/2013  . Paresthesia of both feet  08/30/2013   . Blurred vision 02/15/2013  . Eye pain 02/15/2013  . Sinusitis, chronic 10/07/2012  . Left shoulder pain 04/15/2012  . Preventative health care 10/22/2011  . Cervical disc disease 10/22/2011  . Bilateral hand pain 08/05/2011  . SNHL (sensorineural hearing loss) 08/04/2011  . Fibrocystic breast disease 07/02/2011  . Hypertension 07/01/2011  . GERD (gastroesophageal reflux disease) 06/29/2011  . Bloating 06/29/2011  . Back pain 03/13/2011  . Vertigo 01/04/2011  . Nystagmus 01/04/2011  . Insomnia 10/21/2010  . CONSTIPATION 08/17/2009  . Vitamin D deficiency 05/15/2009  . DYSPNEA 03/13/2009  . Depression with anxiety 01/16/2009  . Irritable bowel syndrome 01/16/2009  . Primary osteoarthritis of both hands 12/15/2008  . Diabetes ([place]) 11/03/2008  . Hyperlipidemia 11/03/2008  . Anxiety state 11/03/2008  . OVERACTIVE BLADDER 11/03/2008  . OSA (obstructive sleep apnea) 11/03/2008  . MURMUR 11/03/2008    Standley Brooking, PTA 01/10/2019, 2:17 PM  [facility] [address] [place], [place], [address] Phone: [phone]   Fax:  [phone]  Name: MARIADEJESUS STORRER MRN: [id number] Date of Birth: Aug 25, 1945

## 2019-01-13 ENCOUNTER — Encounter: Payer: Medicare Other | Admitting: Physical Therapy

## 2019-01-17 ENCOUNTER — Ambulatory Visit: Payer: Medicare Other | Attending: Family Medicine | Admitting: Physical Therapy

## 2019-01-17 ENCOUNTER — Encounter: Payer: Self-pay | Admitting: Physical Therapy

## 2019-01-17 ENCOUNTER — Other Ambulatory Visit: Payer: Self-pay

## 2019-01-17 DIAGNOSIS — M542 Cervicalgia: Secondary | ICD-10-CM | POA: Insufficient documentation

## 2019-01-17 DIAGNOSIS — M25512 Pain in left shoulder: Secondary | ICD-10-CM | POA: Diagnosis not present

## 2019-01-17 DIAGNOSIS — G8929 Other chronic pain: Secondary | ICD-10-CM | POA: Diagnosis not present

## 2019-01-17 NOTE — Therapy (Signed)
[facility] [place], [place], [address] Phone: [phone]   Fax:  [phone]  Physical Therapy Treatment  Patient Details  Name: Laura [name] MRN: [id number] Date of Birth: 28-Apr-1945 Referring Provider (PT): Eunice Blase MD   Encounter Date: 01/17/2019  PT End of Session - 01/17/19 1449    Visit Number  14    Number of Visits  18    Date for PT Re-Evaluation  02/14/19    Authorization Type  PROGRESS NOTE AT 10TH VISIT.  KX MODIFIER AFTER 15 VISITS.    PT Start Time  1345    PT Stop Time  1440    PT Time Calculation (min)  55 min    Activity Tolerance  Patient tolerated treatment well    Behavior During Therapy  WFL for tasks assessed/performed       Past Medical History:  Diagnosis Date  . Anxiety   . Arthritis    back of neck, bones spurs on neck  . Cataract   . Cervical disc disease   . Diabetes mellitus   . Hyperlipidemia   . Hypertension   . Mucoid cyst of joint    right thumb  . Neuropathy   . Reflux   . Sleep apnea    wears CPAP nightly  . Vertigo     Past Surgical History:  Procedure Laterality Date  . BLADDER SURGERY    . BREAST EXCISIONAL BIOPSY Bilateral   . BREAST SURGERY    . CHOLECYSTECTOMY    . fibroids removed     breast (both breasts)  . MASS EXCISION Right 06/26/2016   Procedure: EXCISION MUCOID TUMOR RIGHT THUMB, IP RIGHT THUMB;  Surgeon: Daryll Brod, MD;  Location: [facility];  Service: Orthopedics;  Laterality: Right;  . PARTIAL HYSTERECTOMY      There were no vitals filed for this visit.  Subjective Assessment - 01/17/19 1430    Subjective  COVID-19 screen performed prior to patient entering clinic. Patient arrives with ongoing bilateral tenderness to deltoid insertion region.    Pertinent History  HTN, DM, cervical disc disease.    Patient Stated Goals  Get out of pain.    Currently in Pain?  Yes                       [facility] Adult PT  Treatment/Exercise - 01/17/19 0001      Modalities   Modalities  Ultrasound;Traction      Ultrasound   Ultrasound Location  bilateral UTs    Ultrasound Parameters  Combo 1.5 w/cm2 100% 1 mhz    Ultrasound Goals  Pain      Traction   Type of Traction  Cervical    Min (lbs)  5    Max (lbs)  14    Hold Time  99    Rest Time  5    Time  15      Manual Therapy   Manual Therapy  Soft tissue mobilization    Soft tissue mobilization  STW/TPR to left deltoid tuberosity reigon to reduce tone                  PT Long Term Goals - 12/13/18 1315      PT LONG TERM GOAL #1   Title  Perform ADL's with pain not > 3/10.    Time  6    Period  Weeks    Status  On-going  PT LONG TERM GOAL #2   Title  Active left shoulder flexion to 125 degrees so the patient can easily reach overhead    Time  6    Period  Weeks    Status  Achieved      PT LONG TERM GOAL #3   Title  Left shoulder ER to 65 degrees.    Time  6    Period  Weeks    Status  On-going      PT LONG TERM GOAL #4   Title  Reach behind back with left hand to L3.    Time  6    Period  Weeks    Status  On-going   L glute 12/13/2018     PT LONG TERM GOAL #5   Title  Left active cervical rotation to 70 degrees.    Time  6    Period  Weeks    Status  On-going            Plan - 01/17/19 1444    Clinical Impression Statement  Patient responded well to therapy session with reports of decreased discomfort and tenderness to deltoid insertion region after STW/M. Traction increased to 14# per patient request. No adverse affects to traction at end of session.    Personal Factors and Comorbidities  Comorbidity 1;Comorbidity 2    Comorbidities  HTN, DM, cervical disc disease.    Examination-Activity Limitations  Reach Overhead;Other;Lift    Examination-Participation Restrictions  Other    Stability/Clinical Decision Making  Evolving/Moderate complexity    Clinical Decision Making  Low    Rehab Potential  Good     PT Frequency  2x / week    PT Duration  6 weeks    PT Treatment/Interventions  ADLs/Self Care Home Management;Cryotherapy;Electrical Stimulation;Ultrasound;Traction;Moist Heat;Therapeutic activities;Therapeutic exercise;Manual techniques;Patient/family education;Passive range of motion;Dry needling;Joint Manipulations    PT Next Visit Plan  Assess cervical symptoms, maintain traction at 13-14#    Consulted and Agree with Plan of Care  Patient       Patient will benefit from skilled therapeutic intervention in order to improve the following deficits and impairments:  Pain, Postural dysfunction, Decreased activity tolerance, Decreased range of motion, Increased muscle spasms  Visit Diagnosis: Chronic left shoulder pain  Cervicalgia     Problem List Patient Active Problem List   Diagnosis Date Noted  . Acute gouty arthritis 12/17/2018  . Bilateral hearing loss 12/21/2017  . Eustachian tube disorder, bilateral 12/21/2017  . Cough 02/23/2017  . Allergic conjunctivitis 12/12/2016  . Left hand pain 05/26/2016  . Mucoid cyst, joint 05/26/2016  . Pain in finger of right hand 05/26/2016  . Primary osteoarthritis of both first carpometacarpal joints 05/26/2016  . Skin avulsion 05/26/2016  . Adhesive capsulitis of left shoulder 03/20/2016  . Fatigue 12/16/2015  . Acute bronchitis 10/31/2015  . Urinary frequency 03/09/2015  . Hearing loss, sensorineural, asymmetrical 11/09/2014  . Vestibular hypofunction, left 11/09/2014  . Morbid obesity ([place]) 07/25/2014  . Orthostatic hypotension 07/07/2014  . Upper airway cough syndrome 06/26/2014  . Diarrhea 05/02/2014  . Dizziness 04/12/2014  . Neuropathy   . Rash and nonspecific skin eruption 08/30/2013  . Paresthesia of both feet 08/30/2013  . Blurred vision 02/15/2013  . Eye pain 02/15/2013  . Sinusitis, chronic 10/07/2012  . Left shoulder pain 04/15/2012  . Preventative health care 10/22/2011  . Cervical disc disease 10/22/2011  .  Bilateral hand pain 08/05/2011  . SNHL (sensorineural hearing loss) 08/04/2011  . Fibrocystic breast  disease 07/02/2011  . Hypertension 07/01/2011  . GERD (gastroesophageal reflux disease) 06/29/2011  . Bloating 06/29/2011  . Back pain 03/13/2011  . Vertigo 01/04/2011  . Nystagmus 01/04/2011  . Insomnia 10/21/2010  . CONSTIPATION 08/17/2009  . Vitamin D deficiency 05/15/2009  . DYSPNEA 03/13/2009  . Depression with anxiety 01/16/2009  . Irritable bowel syndrome 01/16/2009  . Primary osteoarthritis of both hands 12/15/2008  . Diabetes ([place]) 11/03/2008  . Hyperlipidemia 11/03/2008  . Anxiety state 11/03/2008  . OVERACTIVE BLADDER 11/03/2008  . OSA (obstructive sleep apnea) 11/03/2008  . MURMUR 11/03/2008    Gabriela Eves, PT, DPT 01/17/2019, 2:51 PM  [facility] [address] [place], [place], [address] Phone: [phone]   Fax:  [phone]  Name: Laura [name] MRN: [id number] Date of Birth: Apr 26, 1945

## 2019-01-21 ENCOUNTER — Ambulatory Visit: Payer: Medicare Other | Admitting: Physical Therapy

## 2019-01-21 ENCOUNTER — Other Ambulatory Visit: Payer: Self-pay

## 2019-01-21 DIAGNOSIS — G8929 Other chronic pain: Secondary | ICD-10-CM | POA: Diagnosis not present

## 2019-01-21 DIAGNOSIS — M25512 Pain in left shoulder: Secondary | ICD-10-CM

## 2019-01-21 DIAGNOSIS — M542 Cervicalgia: Secondary | ICD-10-CM

## 2019-01-21 NOTE — Therapy (Signed)
[facility] [place], [place], [address] Phone: [phone]   Fax:  [phone]  Physical Therapy Treatment  Patient Details  Name: Laura [name] MRN: [id number] Date of Birth: March 12, 1945 Referring Provider (PT): Eunice Blase MD   Encounter Date: 01/21/2019  PT End of Session - 01/21/19 1126    Visit Number  15    Number of Visits  18    Date for PT Re-Evaluation  02/14/19    Authorization Type  PROGRESS NOTE AT 10TH VISIT.  KX MODIFIER AFTER 15 VISITS.    PT Start Time  1030    PT Stop Time  1120    PT Time Calculation (min)  50 min    Activity Tolerance  Patient tolerated treatment well    Behavior During Therapy  WFL for tasks assessed/performed       Past Medical History:  Diagnosis Date  . Anxiety   . Arthritis    back of neck, bones spurs on neck  . Cataract   . Cervical disc disease   . Diabetes mellitus   . Hyperlipidemia   . Hypertension   . Mucoid cyst of joint    right thumb  . Neuropathy   . Reflux   . Sleep apnea    wears CPAP nightly  . Vertigo     Past Surgical History:  Procedure Laterality Date  . BLADDER SURGERY    . BREAST EXCISIONAL BIOPSY Bilateral   . BREAST SURGERY    . CHOLECYSTECTOMY    . fibroids removed     breast (both breasts)  . MASS EXCISION Right 06/26/2016   Procedure: EXCISION MUCOID TUMOR RIGHT THUMB, IP RIGHT THUMB;  Surgeon: Daryll Brod, MD;  Location: [place];  Service: Orthopedics;  Laterality: Right;  . PARTIAL HYSTERECTOMY      There were no vitals filed for this visit.  Subjective Assessment - 01/21/19 1126    Subjective  COVID-19 screen performed prior to patient entering clinic. I'm doing good.    Pertinent History  HTN, DM, cervical disc disease.    Currently in Pain?  Yes    Pain Location  Neck    Pain Orientation  Left    Pain Descriptors / Indicators  Discomfort    Pain Type  Chronic pain    Pain Onset  More than a month ago                        [facility] Adult PT Treatment/Exercise - 01/21/19 0001      Modalities   Modalities  Traction;Ultrasound      Ultrasound   Ultrasound Location  Left UT/middle deltoid.    Ultrasound Parameters  Combo e'stim/U/S at 1.50 W/CM2 x 12 minutes.      Traction   Type of Traction  Cervical    Min (lbs)  5    Max (lbs)  14    Hold Time  99    Rest Time  5    Time  15      Manual Therapy   Manual Therapy  Soft tissue mobilization    Soft tissue mobilization  STW/M x 12 to left UT/middle deltoid.                  PT Long Term Goals - 12/13/18 1315      PT LONG TERM GOAL #1   Title  Perform ADL's with pain not > 3/10.  Time  6    Period  Weeks    Status  On-going      PT LONG TERM GOAL #2   Title  Active left shoulder flexion to 125 degrees so the patient can easily reach overhead    Time  6    Period  Weeks    Status  Achieved      PT LONG TERM GOAL #3   Title  Left shoulder ER to 65 degrees.    Time  6    Period  Weeks    Status  On-going      PT LONG TERM GOAL #4   Title  Reach behind back with left hand to L3.    Time  6    Period  Weeks    Status  On-going   L glute 12/13/2018     PT LONG TERM GOAL #5   Title  Left active cervical rotation to 70 degrees.    Time  6    Period  Weeks    Status  On-going            Plan - 01/21/19 1129    Clinical Impression Statement  Excellent response with no pain reported after treatment.    Personal Factors and Comorbidities  Comorbidity 1;Comorbidity 2    Comorbidities  HTN, DM, cervical disc disease.    Examination-Activity Limitations  Reach Overhead;Other;Lift    Examination-Participation Restrictions  Other    Rehab Potential  Good    PT Frequency  2x / week    PT Duration  6 weeks    PT Treatment/Interventions  ADLs/Self Care Home Management;Cryotherapy;Electrical Stimulation;Ultrasound;Traction;Moist Heat;Therapeutic activities;Therapeutic exercise;Manual  techniques;Patient/family education;Passive range of motion;Dry needling;Joint Manipulations       Patient will benefit from skilled therapeutic intervention in order to improve the following deficits and impairments:  Pain, Postural dysfunction, Decreased activity tolerance, Decreased range of motion, Increased muscle spasms  Visit Diagnosis: Chronic left shoulder pain  Cervicalgia     Problem List Patient Active Problem List   Diagnosis Date Noted  . Acute gouty arthritis 12/17/2018  . Bilateral hearing loss 12/21/2017  . Eustachian tube disorder, bilateral 12/21/2017  . Cough 02/23/2017  . Allergic conjunctivitis 12/12/2016  . Left hand pain 05/26/2016  . Mucoid cyst, joint 05/26/2016  . Pain in finger of right hand 05/26/2016  . Primary osteoarthritis of both first carpometacarpal joints 05/26/2016  . Skin avulsion 05/26/2016  . Adhesive capsulitis of left shoulder 03/20/2016  . Fatigue 12/16/2015  . Acute bronchitis 10/31/2015  . Urinary frequency 03/09/2015  . Hearing loss, sensorineural, asymmetrical 11/09/2014  . Vestibular hypofunction, left 11/09/2014  . Morbid obesity ([place]) 07/25/2014  . Orthostatic hypotension 07/07/2014  . Upper airway cough syndrome 06/26/2014  . Diarrhea 05/02/2014  . Dizziness 04/12/2014  . Neuropathy   . Rash and nonspecific skin eruption 08/30/2013  . Paresthesia of both feet 08/30/2013  . Blurred vision 02/15/2013  . Eye pain 02/15/2013  . Sinusitis, chronic 10/07/2012  . Left shoulder pain 04/15/2012  . Preventative health care 10/22/2011  . Cervical disc disease 10/22/2011  . Bilateral hand pain 08/05/2011  . SNHL (sensorineural hearing loss) 08/04/2011  . Fibrocystic breast disease 07/02/2011  . Hypertension 07/01/2011  . GERD (gastroesophageal reflux disease) 06/29/2011  . Bloating 06/29/2011  . Back pain 03/13/2011  . Vertigo 01/04/2011  . Nystagmus 01/04/2011  . Insomnia 10/21/2010  . CONSTIPATION 08/17/2009  .  Vitamin D deficiency 05/15/2009  . DYSPNEA 03/13/2009  . Depression  with anxiety 01/16/2009  . Irritable bowel syndrome 01/16/2009  . Primary osteoarthritis of both hands 12/15/2008  . Diabetes ([place]) 11/03/2008  . Hyperlipidemia 11/03/2008  . Anxiety state 11/03/2008  . OVERACTIVE BLADDER 11/03/2008  . OSA (obstructive sleep apnea) 11/03/2008  . MURMUR 11/03/2008    Barbera Perritt, [place] MPT 01/21/2019, 11:32 AM  [facility] [address] [place], [place], [address] Phone: [phone]   Fax:  [phone]  Name: MASHONA FUNCK MRN: [id number] Date of Birth: [birth year]

## 2019-01-24 ENCOUNTER — Ambulatory Visit: Payer: Medicare Other | Admitting: Physical Therapy

## 2019-01-24 ENCOUNTER — Other Ambulatory Visit: Payer: Self-pay

## 2019-01-24 ENCOUNTER — Encounter: Payer: Self-pay | Admitting: Physical Therapy

## 2019-01-24 DIAGNOSIS — M542 Cervicalgia: Secondary | ICD-10-CM

## 2019-01-24 DIAGNOSIS — M25512 Pain in left shoulder: Secondary | ICD-10-CM

## 2019-01-24 DIAGNOSIS — G8929 Other chronic pain: Secondary | ICD-10-CM

## 2019-01-24 NOTE — Therapy (Signed)
[facility] [place], [place], [address] Phone: [phone]   Fax:  [phone]  Physical Therapy Treatment  Patient Details  Name: Laura [name] MRN: [id number] Date of Birth: September 05, 1945 Referring Provider (PT): Eunice Blase MD   Encounter Date: 01/24/2019  PT End of Session - 01/24/19 1036    Visit Number  16    Number of Visits  18    Date for PT Re-Evaluation  02/14/19    Authorization Type  PROGRESS NOTE AT 10TH VISIT.  KX MODIFIER AFTER 15 VISITS.    PT Start Time  1037    PT Stop Time  1123    PT Time Calculation (min)  46 min    Activity Tolerance  Patient tolerated treatment well    Behavior During Therapy  WFL for tasks assessed/performed       Past Medical History:  Diagnosis Date  . Anxiety   . Arthritis    back of neck, bones spurs on neck  . Cataract   . Cervical disc disease   . Diabetes mellitus   . Hyperlipidemia   . Hypertension   . Mucoid cyst of joint    right thumb  . Neuropathy   . Reflux   . Sleep apnea    wears CPAP nightly  . Vertigo     Past Surgical History:  Procedure Laterality Date  . BLADDER SURGERY    . BREAST EXCISIONAL BIOPSY Bilateral   . BREAST SURGERY    . CHOLECYSTECTOMY    . fibroids removed     breast (both breasts)  . MASS EXCISION Right 06/26/2016   Procedure: EXCISION MUCOID TUMOR RIGHT THUMB, IP RIGHT THUMB;  Surgeon: Daryll Brod, MD;  Location: [place];  Service: Orthopedics;  Laterality: Right;  . PARTIAL HYSTERECTOMY      There were no vitals filed for this visit.  Subjective Assessment - 01/24/19 1032    Subjective  COVID-19 screen performed prior to patient entering clinic. I'm doing good. No radicular pain in UEs but still having pain in B shoulder joints. Just tingling in UEs but very occasional.    Pertinent History  HTN, DM, cervical disc disease.    Patient Stated Goals  Get out of pain.    Currently in Pain?  Yes    Pain  Score  3     Pain Location  Neck    Pain Orientation  Left    Pain Descriptors / Indicators  Discomfort    Pain Type  Chronic pain    Pain Onset  More than a month ago    Pain Frequency  Intermittent         [facility] PT Assessment - 01/24/19 0001      Assessment   Medical Diagnosis  Chronic left shoulder and neck pain.    Referring Provider (PT)  Eunice Blase MD      Precautions   Precautions  None                   [facility] Adult PT Treatment/Exercise - 01/24/19 0001      Modalities   Modalities  Traction;Ultrasound      Ultrasound   Ultrasound Location  R UT    Ultrasound Parameters  Combo 1.5 w/cm2, 100%, 1 mhz x10 min    Ultrasound Goals  Pain      Traction   Type of Traction  Cervical    Min (lbs)  5  Max (lbs)  14    Hold Time  99    Rest Time  5    Time  15      Manual Therapy   Manual Therapy  Soft tissue mobilization    Soft tissue mobilization  STW to R UT to reduce tightness and discomfort                  PT Long Term Goals - 12/13/18 1315      PT LONG TERM GOAL #1   Title  Perform ADL's with pain not > 3/10.    Time  6    Period  Weeks    Status  On-going      PT LONG TERM GOAL #2   Title  Active left shoulder flexion to 125 degrees so the patient can easily reach overhead    Time  6    Period  Weeks    Status  Achieved      PT LONG TERM GOAL #3   Title  Left shoulder ER to 65 degrees.    Time  6    Period  Weeks    Status  On-going      PT LONG TERM GOAL #4   Title  Reach behind back with left hand to L3.    Time  6    Period  Weeks    Status  On-going   L glute 12/13/2018     PT LONG TERM GOAL #5   Title  Left active cervical rotation to 70 degrees.    Time  6    Period  Weeks    Status  On-going            Plan - 01/24/19 1132    Clinical Impression Statement  Patient presented in clinic with overall reports of improvement in regards to cervical pain. More R sided discomfort noted today along  with chronic shoulder flare up as well. Moderate R UT tightness palpable but no tenderness reported during manual therapy. Patient indicating TPs located in B inferior deltoids region today. Normal modalities response noted following removal of the modalities.    Personal Factors and Comorbidities  Comorbidity 1;Comorbidity 2    Comorbidities  HTN, DM, cervical disc disease.    Examination-Activity Limitations  Reach Overhead;Other;Lift    Examination-Participation Restrictions  Other    Stability/Clinical Decision Making  Evolving/Moderate complexity    Rehab Potential  Good    PT Frequency  2x / week    PT Duration  6 weeks    PT Treatment/Interventions  ADLs/Self Care Home Management;Cryotherapy;Electrical Stimulation;Ultrasound;Traction;Moist Heat;Therapeutic activities;Therapeutic exercise;Manual techniques;Patient/family education;Passive range of motion;Dry needling;Joint Manipulations    PT Next Visit Plan  Assess cervical symptoms, maintain traction at 13-14#    Consulted and Agree with Plan of Care  Patient       Patient will benefit from skilled therapeutic intervention in order to improve the following deficits and impairments:  Pain, Postural dysfunction, Decreased activity tolerance, Decreased range of motion, Increased muscle spasms  Visit Diagnosis: Chronic left shoulder pain  Cervicalgia     Problem List Patient Active Problem List   Diagnosis Date Noted  . Acute gouty arthritis 12/17/2018  . Bilateral hearing loss 12/21/2017  . Eustachian tube disorder, bilateral 12/21/2017  . Cough 02/23/2017  . Allergic conjunctivitis 12/12/2016  . Left hand pain 05/26/2016  . Mucoid cyst, joint 05/26/2016  . Pain in finger of right hand 05/26/2016  . Primary osteoarthritis of both first  carpometacarpal joints 05/26/2016  . Skin avulsion 05/26/2016  . Adhesive capsulitis of left shoulder 03/20/2016  . Fatigue 12/16/2015  . Acute bronchitis 10/31/2015  . Urinary frequency  03/09/2015  . Hearing loss, sensorineural, asymmetrical 11/09/2014  . Vestibular hypofunction, left 11/09/2014  . Morbid obesity ([place]) 07/25/2014  . Orthostatic hypotension 07/07/2014  . Upper airway cough syndrome 06/26/2014  . Diarrhea 05/02/2014  . Dizziness 04/12/2014  . Neuropathy   . Rash and nonspecific skin eruption 08/30/2013  . Paresthesia of both feet 08/30/2013  . Blurred vision 02/15/2013  . Eye pain 02/15/2013  . Sinusitis, chronic 10/07/2012  . Left shoulder pain 04/15/2012  . Preventative health care 10/22/2011  . Cervical disc disease 10/22/2011  . Bilateral hand pain 08/05/2011  . SNHL (sensorineural hearing loss) 08/04/2011  . Fibrocystic breast disease 07/02/2011  . Hypertension 07/01/2011  . GERD (gastroesophageal reflux disease) 06/29/2011  . Bloating 06/29/2011  . Back pain 03/13/2011  . Vertigo 01/04/2011  . Nystagmus 01/04/2011  . Insomnia 10/21/2010  . CONSTIPATION 08/17/2009  . Vitamin D deficiency 05/15/2009  . DYSPNEA 03/13/2009  . Depression with anxiety 01/16/2009  . Irritable bowel syndrome 01/16/2009  . Primary osteoarthritis of both hands 12/15/2008  . Diabetes ([place]) 11/03/2008  . Hyperlipidemia 11/03/2008  . Anxiety state 11/03/2008  . OVERACTIVE BLADDER 11/03/2008  . OSA (obstructive sleep apnea) 11/03/2008  . MURMUR 11/03/2008    Standley Brooking, PTA 01/24/2019, 11:35 AM  [facility] [address] [place], [place], [address] Phone: [phone]   Fax:  [phone]  Name: KEIAIRA SHALOM MRN: [id number] Date of Birth: [birth year]

## 2019-01-28 ENCOUNTER — Other Ambulatory Visit: Payer: Self-pay

## 2019-01-28 ENCOUNTER — Ambulatory Visit: Payer: Medicare Other | Admitting: Physical Therapy

## 2019-01-28 ENCOUNTER — Encounter: Payer: Self-pay | Admitting: Physical Therapy

## 2019-01-28 DIAGNOSIS — G8929 Other chronic pain: Secondary | ICD-10-CM

## 2019-01-28 DIAGNOSIS — M542 Cervicalgia: Secondary | ICD-10-CM

## 2019-01-28 DIAGNOSIS — M25512 Pain in left shoulder: Secondary | ICD-10-CM | POA: Diagnosis not present

## 2019-01-28 NOTE — Addendum Note (Signed)
Addended by: Kahlea Cobert, [place] W on: 01/28/2019 12:54 PM   Modules accepted: Orders

## 2019-01-28 NOTE — Therapy (Signed)
[facility] [place], [place], [address] Phone: [phone]   Fax:  [phone]  Physical Therapy Treatment  Patient Details  Name: Laura [name] MRN: [id number] Date of Birth: [birth year] Referring Provider (PT): Eunice Blase MD   Encounter Date: 01/28/2019  PT End of Session - 01/28/19 1245    Visit Number  17    Number of Visits  24    Date for PT Re-Evaluation  03/14/19    Authorization Type  PROGRESS NOTE AT 10TH VISIT.  KX MODIFIER AFTER 15 VISITS.    PT Start Time  1030    PT Stop Time  1124    PT Time Calculation (min)  54 min       Past Medical History:  Diagnosis Date  . Anxiety   . Arthritis    back of neck, bones spurs on neck  . Cataract   . Cervical disc disease   . Diabetes mellitus   . Hyperlipidemia   . Hypertension   . Mucoid cyst of joint    right thumb  . Neuropathy   . Reflux   . Sleep apnea    wears CPAP nightly  . Vertigo     Past Surgical History:  Procedure Laterality Date  . BLADDER SURGERY    . BREAST EXCISIONAL BIOPSY Bilateral   . BREAST SURGERY    . CHOLECYSTECTOMY    . fibroids removed     breast (both breasts)  . MASS EXCISION Right 06/26/2016   Procedure: EXCISION MUCOID TUMOR RIGHT THUMB, IP RIGHT THUMB;  Surgeon: Daryll Brod, MD;  Location: [place];  Service: Orthopedics;  Laterality: Right;  . PARTIAL HYSTERECTOMY      There were no vitals filed for this visit.  Subjective Assessment - 01/28/19 1239    Subjective  COVID-19 screen performed prior to patient entering clinic.  The patient is very pleased with how her neck is feeling.  She would like to continue PT and focus on improving her left shoulder range of motion.    Pertinent History  HTN, DM, cervical disc disease.    Patient Stated Goals  Get out of pain.    Currently in Pain?  Yes    Pain Orientation  Left    Pain Descriptors / Indicators  Discomfort    Pain Type  Chronic pain    Pain  Onset  More than a month ago                       [facility] Adult PT Treatment/Exercise - 01/28/19 0001      Modalities   Modalities  Traction;Ultrasound      Ultrasound   Ultrasound Location  LT UT.    Ultrasound Parameters  U/S at 1.50 W/CM2 x 12 minutes.    Ultrasound Goals  Pain      Traction   Type of Traction  Cervical    Min (lbs)  5    Max (lbs)  14    Hold Time  99    Rest Time  5    Time  15      Manual Therapy   Manual Therapy  Soft tissue mobilization    Soft tissue mobilization  STW/M x 12 minutes to left UT and posterior cuff musculature to decrease tone.                  PT Long Term Goals - 12/13/18 1315  PT LONG TERM GOAL #1   Title  Perform ADL's with pain not > 3/10.    Time  6    Period  Weeks    Status  On-going      PT LONG TERM GOAL #2   Title  Active left shoulder flexion to 125 degrees so the patient can easily reach overhead    Time  6    Period  Weeks    Status  Achieved      PT LONG TERM GOAL #3   Title  Left shoulder ER to 65 degrees.    Time  6    Period  Weeks    Status  On-going      PT LONG TERM GOAL #4   Title  Reach behind back with left hand to L3.    Time  6    Period  Weeks    Status  On-going   L glute 12/13/2018     PT LONG TERM GOAL #5   Title  Left active cervical rotation to 70 degrees.    Time  6    Period  Weeks    Status  On-going            Plan - 01/28/19 1246    Clinical Impression Statement  The patient has done very well with regards to decreasing her neck pain.  She would like to work on improving her left shoulder range of motion.    Personal Factors and Comorbidities  Comorbidity 1;Comorbidity 2    Examination-Activity Limitations  Reach Overhead;Other;Lift    Examination-Participation Restrictions  Other    Stability/Clinical Decision Making  Evolving/Moderate complexity    Rehab Potential  Good    PT Frequency  2x / week    PT Duration  6 weeks    PT  Treatment/Interventions  ADLs/Self Care Home Management;Cryotherapy;Electrical Stimulation;Ultrasound;Traction;Moist Heat;Therapeutic activities;Therapeutic exercise;Manual techniques;Patient/family education;Passive range of motion;Dry needling;Joint Manipulations    PT Next Visit Plan  UE Ranger, wall ladder, PROM to left shoulder.  Modalities as needed to patient's left shoulder.    Consulted and Agree with Plan of Care  Patient       Patient will benefit from skilled therapeutic intervention in order to improve the following deficits and impairments:  Pain, Postural dysfunction, Decreased activity tolerance, Decreased range of motion, Increased muscle spasms  Visit Diagnosis: Chronic left shoulder pain  Cervicalgia     Problem List Patient Active Problem List   Diagnosis Date Noted  . Acute gouty arthritis 12/17/2018  . Bilateral hearing loss 12/21/2017  . Eustachian tube disorder, bilateral 12/21/2017  . Cough 02/23/2017  . Allergic conjunctivitis 12/12/2016  . Left hand pain 05/26/2016  . Mucoid cyst, joint 05/26/2016  . Pain in finger of right hand 05/26/2016  . Primary osteoarthritis of both first carpometacarpal joints 05/26/2016  . Skin avulsion 05/26/2016  . Adhesive capsulitis of left shoulder 03/20/2016  . Fatigue 12/16/2015  . Acute bronchitis 10/31/2015  . Urinary frequency 03/09/2015  . Hearing loss, sensorineural, asymmetrical 11/09/2014  . Vestibular hypofunction, left 11/09/2014  . Morbid obesity ([place]) 07/25/2014  . Orthostatic hypotension 07/07/2014  . Upper airway cough syndrome 06/26/2014  . Diarrhea 05/02/2014  . Dizziness 04/12/2014  . Neuropathy   . Rash and nonspecific skin eruption 08/30/2013  . Paresthesia of both feet 08/30/2013  . Blurred vision 02/15/2013  . Eye pain 02/15/2013  . Sinusitis, chronic 10/07/2012  . Left shoulder pain 04/15/2012  . Preventative health care 10/22/2011  .  Cervical disc disease 10/22/2011  . Bilateral hand  pain 08/05/2011  . SNHL (sensorineural hearing loss) 08/04/2011  . Fibrocystic breast disease 07/02/2011  . Hypertension 07/01/2011  . GERD (gastroesophageal reflux disease) 06/29/2011  . Bloating 06/29/2011  . Back pain 03/13/2011  . Vertigo 01/04/2011  . Nystagmus 01/04/2011  . Insomnia 10/21/2010  . CONSTIPATION 08/17/2009  . Vitamin D deficiency 05/15/2009  . DYSPNEA 03/13/2009  . Depression with anxiety 01/16/2009  . Irritable bowel syndrome 01/16/2009  . Primary osteoarthritis of both hands 12/15/2008  . Diabetes ([place]) 11/03/2008  . Hyperlipidemia 11/03/2008  . Anxiety state 11/03/2008  . OVERACTIVE BLADDER 11/03/2008  . OSA (obstructive sleep apnea) 11/03/2008  . MURMUR 11/03/2008    Alexea Blase, [place] MPT 01/28/2019, 12:49 PM  [facility] [address] [place], [place], [address] Phone: [phone]   Fax:  [phone]  Name: SHAILYN HAYENGA MRN: [id number] Date of Birth: [birth year]

## 2019-02-01 ENCOUNTER — Ambulatory Visit: Payer: Medicare Other | Admitting: *Deleted

## 2019-02-01 ENCOUNTER — Other Ambulatory Visit: Payer: Self-pay

## 2019-02-01 DIAGNOSIS — G8929 Other chronic pain: Secondary | ICD-10-CM | POA: Diagnosis not present

## 2019-02-01 DIAGNOSIS — M542 Cervicalgia: Secondary | ICD-10-CM

## 2019-02-01 DIAGNOSIS — M25512 Pain in left shoulder: Secondary | ICD-10-CM | POA: Diagnosis not present

## 2019-02-01 NOTE — Therapy (Signed)
[facility] [place], [place], [address] Phone: [phone]   Fax:  [phone]  Physical Therapy Treatment  Patient Details  Name: Laura [name] MRN: [id number] Date of Birth: Sep 11, 1945 Referring Provider (PT): Eunice Blase MD   Encounter Date: 02/01/2019  PT End of Session - 02/01/19 1035    Visit Number  18    Number of Visits  24    Date for PT Re-Evaluation  03/14/19    Authorization Type  PROGRESS NOTE AT 10TH VISIT.  KX MODIFIER AFTER 15 VISITS.    PT Start Time  1030    PT Stop Time  1120    PT Time Calculation (min)  50 min       Past Medical History:  Diagnosis Date  . Anxiety   . Arthritis    back of neck, bones spurs on neck  . Cataract   . Cervical disc disease   . Diabetes mellitus   . Hyperlipidemia   . Hypertension   . Mucoid cyst of joint    right thumb  . Neuropathy   . Reflux   . Sleep apnea    wears CPAP nightly  . Vertigo     Past Surgical History:  Procedure Laterality Date  . BLADDER SURGERY    . BREAST EXCISIONAL BIOPSY Bilateral   . BREAST SURGERY    . CHOLECYSTECTOMY    . fibroids removed     breast (both breasts)  . MASS EXCISION Right 06/26/2016   Procedure: EXCISION MUCOID TUMOR RIGHT THUMB, IP RIGHT THUMB;  Surgeon: Daryll Brod, MD;  Location: [place];  Service: Orthopedics;  Laterality: Right;  . PARTIAL HYSTERECTOMY      There were no vitals filed for this visit.  Subjective Assessment - 02/01/19 1036    Subjective  COVID-19 screen performed prior to patient entering clinic.  T  She would like to continue PT and focus on improving her left shoulder range of motion.and still do traction for neck    Pertinent History  HTN, DM, cervical disc disease.    Patient Stated Goals  Get out of pain.    Currently in Pain?  Yes    Pain Score  3     Pain Location  Shoulder    Pain Orientation  Left    Pain Descriptors / Indicators  Sore    Pain Type  Chronic  pain    Pain Onset  More than a month ago                       [facility] Adult PT Treatment/Exercise - 02/01/19 0001      Shoulder Exercises: Pulleys   Flexion  5 minutes    Other Pulley Exercises  UE ranger for flexion 3x 10       Modalities   Modalities  Traction;Ultrasound      Traction   Type of Traction  Cervical    Min (lbs)  5    Max (lbs)  14    Hold Time  99    Rest Time  5    Time  15      Manual Therapy   Manual Therapy  Passive ROM    Passive ROM  PROM to LT shldr for IR / ER in scaption  with Pt supine                  PT Long Term Goals - 12/13/18  [place] #1   Title  Perform ADL's with pain not > 3/10.    Time  6    Period  Weeks    Status  On-going      PT LONG TERM GOAL #2   Title  Active left shoulder flexion to 125 degrees so the patient can easily reach overhead    Time  6    Period  Weeks    Status  Achieved      PT LONG TERM GOAL #3   Title  Left shoulder ER to 65 degrees.    Time  6    Period  Weeks    Status  On-going      PT LONG TERM GOAL #4   Title  Reach behind back with left hand to L3.    Time  6    Period  Weeks    Status  On-going   L glute 12/13/2018     PT LONG TERM GOAL #5   Title  Left active cervical rotation to 70 degrees.    Time  6    Period  Weeks    Status  On-going            Plan - 02/01/19 1031    Clinical Impression Statement  Pt arrived today reporting she wants to focus on LT shldr ROM, but continue with traction for neck. Pt was guided through [place] for ROM f/b passive stretching for IR and ER. Notable tightness in ER motion.  Traction at 14#s tolerated well again.    Comorbidities  HTN, DM, cervical disc disease.    Examination-Activity Limitations  Reach Overhead;Other;Lift    Stability/Clinical Decision Making  Evolving/Moderate complexity    PT Frequency  2x / week    PT Duration  6 weeks    PT Treatment/Interventions  ADLs/Self Care Home  Management;Cryotherapy;Electrical Stimulation;Ultrasound;Traction;Moist Heat;Therapeutic activities;Therapeutic exercise;Manual techniques;Patient/family education;Passive range of motion;Dry needling;Joint Manipulations    PT Next Visit Plan  UE Ranger, wall ladder, PROM to left shoulder.  Modalities as needed to patient's left shoulder.    Consulted and Agree with Plan of Care  Patient       Patient will benefit from skilled therapeutic intervention in order to improve the following deficits and impairments:  Pain, Postural dysfunction, Decreased activity tolerance, Decreased range of motion, Increased muscle spasms  Visit Diagnosis: Chronic left shoulder pain  Cervicalgia     Problem List Patient Active Problem List   Diagnosis Date Noted  . Acute gouty arthritis 12/17/2018  . Bilateral hearing loss 12/21/2017  . Eustachian tube disorder, bilateral 12/21/2017  . Cough 02/23/2017  . Allergic conjunctivitis 12/12/2016  . Left hand pain 05/26/2016  . Mucoid cyst, joint 05/26/2016  . Pain in finger of right hand 05/26/2016  . Primary osteoarthritis of both first carpometacarpal joints 05/26/2016  . Skin avulsion 05/26/2016  . Adhesive capsulitis of left shoulder 03/20/2016  . Fatigue 12/16/2015  . Acute bronchitis 10/31/2015  . Urinary frequency 03/09/2015  . Hearing loss, sensorineural, asymmetrical 11/09/2014  . Vestibular hypofunction, left 11/09/2014  . Morbid obesity ([place]) 07/25/2014  . Orthostatic hypotension 07/07/2014  . Upper airway cough syndrome 06/26/2014  . Diarrhea 05/02/2014  . Dizziness 04/12/2014  . Neuropathy   . Rash and nonspecific skin eruption 08/30/2013  . Paresthesia of both feet 08/30/2013  . Blurred vision 02/15/2013  . Eye pain 02/15/2013  . Sinusitis, chronic 10/07/2012  . Left shoulder pain  04/15/2012  . Preventative health care 10/22/2011  . Cervical disc disease 10/22/2011  . Bilateral hand pain 08/05/2011  . SNHL (sensorineural hearing  loss) 08/04/2011  . Fibrocystic breast disease 07/02/2011  . Hypertension 07/01/2011  . GERD (gastroesophageal reflux disease) 06/29/2011  . Bloating 06/29/2011  . Back pain 03/13/2011  . Vertigo 01/04/2011  . Nystagmus 01/04/2011  . Insomnia 10/21/2010  . CONSTIPATION 08/17/2009  . Vitamin D deficiency 05/15/2009  . DYSPNEA 03/13/2009  . Depression with anxiety 01/16/2009  . Irritable bowel syndrome 01/16/2009  . Primary osteoarthritis of both hands 12/15/2008  . Diabetes ([place]) 11/03/2008  . Hyperlipidemia 11/03/2008  . Anxiety state 11/03/2008  . OVERACTIVE BLADDER 11/03/2008  . OSA (obstructive sleep apnea) 11/03/2008  . MURMUR 11/03/2008    Laura [name],CHRIS, PTA 02/01/2019, 1:20 PM  [facility] [address] [place], [place], [address] Phone: [phone]   Fax:  [phone]  Name: Laura [name] MRN: [id number] Date of Birth: 07-24-1945

## 2019-02-04 ENCOUNTER — Ambulatory Visit: Payer: Medicare Other | Admitting: Physical Therapy

## 2019-02-04 ENCOUNTER — Other Ambulatory Visit: Payer: Self-pay

## 2019-02-04 DIAGNOSIS — M25512 Pain in left shoulder: Secondary | ICD-10-CM | POA: Diagnosis not present

## 2019-02-04 DIAGNOSIS — G8929 Other chronic pain: Secondary | ICD-10-CM | POA: Diagnosis not present

## 2019-02-04 DIAGNOSIS — M542 Cervicalgia: Secondary | ICD-10-CM

## 2019-02-04 NOTE — Therapy (Signed)
[facility] [place], [place], [address] Phone: [phone]   Fax:  [phone]  Physical Therapy Treatment  Patient Details  Name: Laura [name] MRN: [id number] Date of Birth: 1945-01-25 Referring Provider (PT): Eunice Blase MD   Encounter Date: 02/04/2019  PT End of Session - 02/04/19 1130    Visit Number  19    Number of Visits  24    Date for PT Re-Evaluation  03/14/19    Authorization Type  PROGRESS NOTE AT 10TH VISIT.  KX MODIFIER AFTER 15 VISITS.    PT Start Time  1030    PT Stop Time  1122    PT Time Calculation (min)  52 min    Activity Tolerance  Patient tolerated treatment well    Behavior During Therapy  WFL for tasks assessed/performed       Past Medical History:  Diagnosis Date  . Anxiety   . Arthritis    back of neck, bones spurs on neck  . Cataract   . Cervical disc disease   . Diabetes mellitus   . Hyperlipidemia   . Hypertension   . Mucoid cyst of joint    right thumb  . Neuropathy   . Reflux   . Sleep apnea    wears CPAP nightly  . Vertigo     Past Surgical History:  Procedure Laterality Date  . BLADDER SURGERY    . BREAST EXCISIONAL BIOPSY Bilateral   . BREAST SURGERY    . CHOLECYSTECTOMY    . fibroids removed     breast (both breasts)  . MASS EXCISION Right 06/26/2016   Procedure: EXCISION MUCOID TUMOR RIGHT THUMB, IP RIGHT THUMB;  Surgeon: Daryll Brod, MD;  Location: [place];  Service: Orthopedics;  Laterality: Right;  . PARTIAL HYSTERECTOMY      There were no vitals filed for this visit.  Subjective Assessment - 02/04/19 1117    Subjective  COVID-19 screen performed prior to patient entering clinic.  No new cpmplaints.    Pertinent History  HTN, DM, cervical disc disease.    Currently in Pain?  Yes    Pain Location  Shoulder    Pain Orientation  Left    Pain Descriptors / Indicators  Sore    Pain Type  Chronic pain    Pain Onset  More than a month ago                        [facility] Adult PT Treatment/Exercise - 02/04/19 0001      Exercises   Exercises  Shoulder      Shoulder Exercises: Pulleys   Flexion  5 minutes    Other Pulley Exercises  Seated UE Ranger x 5 minutes f/b wall ladder x 2 minutes.      Traction   Type of Traction  Cervical    Min (lbs)  5    Max (lbs)  14    Hold Time  99    Rest Time  5    Time  15      Manual Therapy   Soft tissue mobilization  STW/M x 11 minutes to patient left shoulder and medial scapular border region.                  PT Long Term Goals - 12/13/18 1315      PT LONG TERM GOAL #1   Title  Perform ADL's with pain not >  3/10.    Time  6    Period  Weeks    Status  On-going      PT LONG TERM GOAL #2   Title  Active left shoulder flexion to 125 degrees so the patient can easily reach overhead    Time  6    Period  Weeks    Status  Achieved      PT LONG TERM GOAL #3   Title  Left shoulder ER to 65 degrees.    Time  6    Period  Weeks    Status  On-going      PT LONG TERM GOAL #4   Title  Reach behind back with left hand to L3.    Time  6    Period  Weeks    Status  On-going   L glute 12/13/2018     PT LONG TERM GOAL #5   Title  Left active cervical rotation to 70 degrees.    Time  6    Period  Weeks    Status  On-going            Plan - 02/04/19 1125    Clinical Impression Statement  Patient did great with treatment today.  Still needs work on left shoulder  range of motio due to ongoing stiffness.    Personal Factors and Comorbidities  Comorbidity 1;Comorbidity 2    Comorbidities  HTN, DM, cervical disc disease.    Examination-Activity Limitations  Reach Overhead;Other;Lift    Examination-Participation Restrictions  Other    Stability/Clinical Decision Making  Evolving/Moderate complexity    Rehab Potential  Good    PT Frequency  2x / week    PT Duration  6 weeks    PT Treatment/Interventions  ADLs/Self Care Home  Management;Cryotherapy;Electrical Stimulation;Ultrasound;Traction;Moist Heat;Therapeutic activities;Therapeutic exercise;Manual techniques;Patient/family education;Passive range of motion;Dry needling;Joint Manipulations    PT Next Visit Plan  UE Ranger, wall ladder, PROM to left shoulder.  Modalities as needed to patient's left shoulder.    Consulted and Agree with Plan of Care  Patient       Patient will benefit from skilled therapeutic intervention in order to improve the following deficits and impairments:  Pain, Postural dysfunction, Decreased activity tolerance, Decreased range of motion, Increased muscle spasms  Visit Diagnosis: Chronic left shoulder pain  Cervicalgia     Problem List Patient Active Problem List   Diagnosis Date Noted  . Acute gouty arthritis 12/17/2018  . Bilateral hearing loss 12/21/2017  . Eustachian tube disorder, bilateral 12/21/2017  . Cough 02/23/2017  . Allergic conjunctivitis 12/12/2016  . Left hand pain 05/26/2016  . Mucoid cyst, joint 05/26/2016  . Pain in finger of right hand 05/26/2016  . Primary osteoarthritis of both first carpometacarpal joints 05/26/2016  . Skin avulsion 05/26/2016  . Adhesive capsulitis of left shoulder 03/20/2016  . Fatigue 12/16/2015  . Acute bronchitis 10/31/2015  . Urinary frequency 03/09/2015  . Hearing loss, sensorineural, asymmetrical 11/09/2014  . Vestibular hypofunction, left 11/09/2014  . Morbid obesity ([place]) 07/25/2014  . Orthostatic hypotension 07/07/2014  . Upper airway cough syndrome 06/26/2014  . Diarrhea 05/02/2014  . Dizziness 04/12/2014  . Neuropathy   . Rash and nonspecific skin eruption 08/30/2013  . Paresthesia of both feet 08/30/2013  . Blurred vision 02/15/2013  . Eye pain 02/15/2013  . Sinusitis, chronic 10/07/2012  . Left shoulder pain 04/15/2012  . Preventative health care 10/22/2011  . Cervical disc disease 10/22/2011  . Bilateral hand pain 08/05/2011  .  SNHL (sensorineural hearing  loss) 08/04/2011  . Fibrocystic breast disease 07/02/2011  . Hypertension 07/01/2011  . GERD (gastroesophageal reflux disease) 06/29/2011  . Bloating 06/29/2011  . Back pain 03/13/2011  . Vertigo 01/04/2011  . Nystagmus 01/04/2011  . Insomnia 10/21/2010  . CONSTIPATION 08/17/2009  . Vitamin D deficiency 05/15/2009  . DYSPNEA 03/13/2009  . Depression with anxiety 01/16/2009  . Irritable bowel syndrome 01/16/2009  . Primary osteoarthritis of both hands 12/15/2008  . Diabetes ([place]) 11/03/2008  . Hyperlipidemia 11/03/2008  . Anxiety state 11/03/2008  . OVERACTIVE BLADDER 11/03/2008  . OSA (obstructive sleep apnea) 11/03/2008  . MURMUR 11/03/2008    Lavergne Hiltunen, [place] MPT 02/04/2019, 11:31 AM  [facility] [address] [place], [place], [address] Phone: [phone]   Fax:  [phone]  Name: Laura [name] MRN: [id number] Date of Birth: 1945-07-09

## 2019-02-08 ENCOUNTER — Ambulatory Visit: Payer: Medicare Other | Admitting: Physical Therapy

## 2019-02-08 ENCOUNTER — Encounter: Payer: Self-pay | Admitting: Physical Therapy

## 2019-02-08 ENCOUNTER — Other Ambulatory Visit: Payer: Self-pay

## 2019-02-08 DIAGNOSIS — M542 Cervicalgia: Secondary | ICD-10-CM

## 2019-02-08 DIAGNOSIS — G8929 Other chronic pain: Secondary | ICD-10-CM

## 2019-02-08 DIAGNOSIS — M25512 Pain in left shoulder: Secondary | ICD-10-CM

## 2019-02-08 NOTE — Therapy (Signed)
[facility] [place], [place], [address] Phone: [phone]   Fax:  [phone]  Physical Therapy Treatment  Patient Details  Name: Laura [name] MRN: [id number] Date of Birth: Jul 27, 1945 Referring Provider (PT): Eunice Blase MD   Encounter Date: 02/08/2019  PT End of Session - 02/08/19 1305    Visit Number  20    Number of Visits  24    Date for PT Re-Evaluation  03/14/19    Authorization Type  PROGRESS NOTE AT 10TH VISIT.  KX MODIFIER AFTER 15 VISITS.    PT Start Time  1305    PT Stop Time  1349    PT Time Calculation (min)  44 min    Activity Tolerance  Patient tolerated treatment well    Behavior During Therapy  WFL for tasks assessed/performed       Past Medical History:  Diagnosis Date  . Anxiety   . Arthritis    back of neck, bones spurs on neck  . Cataract   . Cervical disc disease   . Diabetes mellitus   . Hyperlipidemia   . Hypertension   . Mucoid cyst of joint    right thumb  . Neuropathy   . Reflux   . Sleep apnea    wears CPAP nightly  . Vertigo     Past Surgical History:  Procedure Laterality Date  . BLADDER SURGERY    . BREAST EXCISIONAL BIOPSY Bilateral   . BREAST SURGERY    . CHOLECYSTECTOMY    . fibroids removed     breast (both breasts)  . MASS EXCISION Right 06/26/2016   Procedure: EXCISION MUCOID TUMOR RIGHT THUMB, IP RIGHT THUMB;  Surgeon: Daryll Brod, MD;  Location: [place];  Service: Orthopedics;  Laterality: Right;  . PARTIAL HYSTERECTOMY      There were no vitals filed for this visit.  Subjective Assessment - 02/08/19 1303    Subjective  COVID-19 screen performed prior to patient entering clinic.  requests no strtching.    Pertinent History  HTN, DM, cervical disc disease.    Patient Stated Goals  Get out of pain.    Currently in Pain?  Yes    Pain Score  3     Pain Location  Shoulder    Pain Orientation  Left    Pain Descriptors / Indicators  Sore     Pain Type  Chronic pain    Pain Onset  More than a month ago    Pain Frequency  Intermittent         [facility] PT Assessment - 02/08/19 0001      Assessment   Medical Diagnosis  Chronic left shoulder and neck pain.    Referring Provider (PT)  Eunice Blase MD      Precautions   Precautions  None      Restrictions   Weight Bearing Restrictions  No                   [facility] Adult PT Treatment/Exercise - 02/08/19 0001      Modalities   Modalities  Traction      Traction   Type of Traction  Cervical    Min (lbs)  5    Max (lbs)  14    Hold Time  99    Rest Time  5    Time  15      Manual Therapy   Manual Therapy  Passive ROM;Soft tissue mobilization  Soft tissue mobilization  STW to L UT to reduce TPR and tightness    Passive ROM  PROM of L shoulder into flex/ER/IR with gentle holds at end range                  PT Long Term Goals - 12/13/18 1315      PT LONG TERM GOAL #1   Title  Perform ADL's with pain not > 3/10.    Time  6    Period  Weeks    Status  On-going      PT LONG TERM GOAL #2   Title  Active left shoulder flexion to 125 degrees so the patient can easily reach overhead    Time  6    Period  Weeks    Status  Achieved      PT LONG TERM GOAL #3   Title  Left shoulder ER to 65 degrees.    Time  6    Period  Weeks    Status  On-going      PT LONG TERM GOAL #4   Title  Reach behind back with left hand to L3.    Time  6    Period  Weeks    Status  On-going   L glute 12/13/2018     PT LONG TERM GOAL #5   Title  Left active cervical rotation to 70 degrees.    Time  6    Period  Weeks    Status  On-going            Plan - 02/08/19 1345    Clinical Impression Statement  Patient presented in clinic with hesitancy with therex due to muscle spasms she experienced following previous session. Patient able to tolerate PROM of L shoulder into flex, ER, IR with no reports of pain. Limitation noted with L shoulder ER at this  time. Patient reports that MD said no excessive OH activity. TP noted in L UT but released following TPR. Normal mechanical traction response noted at 14# max.    Personal Factors and Comorbidities  Comorbidity 1;Comorbidity 2    Comorbidities  HTN, DM, cervical disc disease.    Examination-Activity Limitations  Reach Overhead;Other;Lift    Examination-Participation Restrictions  Other    Stability/Clinical Decision Making  Evolving/Moderate complexity    Rehab Potential  Good    PT Frequency  2x / week    PT Duration  6 weeks    PT Treatment/Interventions  ADLs/Self Care Home Management;Cryotherapy;Electrical Stimulation;Ultrasound;Traction;Moist Heat;Therapeutic activities;Therapeutic exercise;Manual techniques;Patient/family education;Passive range of motion;Dry needling;Joint Manipulations    PT Next Visit Plan  UE Ranger, wall ladder, PROM to left shoulder.  Modalities as needed to patient's left shoulder.    Consulted and Agree with Plan of Care  Patient       Patient will benefit from skilled therapeutic intervention in order to improve the following deficits and impairments:  Pain, Postural dysfunction, Decreased activity tolerance, Decreased range of motion, Increased muscle spasms  Visit Diagnosis: Chronic left shoulder pain  Cervicalgia     Problem List Patient Active Problem List   Diagnosis Date Noted  . Acute gouty arthritis 12/17/2018  . Bilateral hearing loss 12/21/2017  . Eustachian tube disorder, bilateral 12/21/2017  . Cough 02/23/2017  . Allergic conjunctivitis 12/12/2016  . Left hand pain 05/26/2016  . Mucoid cyst, joint 05/26/2016  . Pain in finger of right hand 05/26/2016  . Primary osteoarthritis of both first carpometacarpal joints 05/26/2016  .   Skin avulsion 05/26/2016  . Adhesive capsulitis of left shoulder 03/20/2016  . Fatigue 12/16/2015  . Acute bronchitis 10/31/2015  . Urinary frequency 03/09/2015  . Hearing loss, sensorineural, asymmetrical  11/09/2014  . Vestibular hypofunction, left 11/09/2014  . Morbid obesity ([place]) 07/25/2014  . Orthostatic hypotension 07/07/2014  . Upper airway cough syndrome 06/26/2014  . Diarrhea 05/02/2014  . Dizziness 04/12/2014  . Neuropathy   . Rash and nonspecific skin eruption 08/30/2013  . Paresthesia of both feet 08/30/2013  . Blurred vision 02/15/2013  . Eye pain 02/15/2013  . Sinusitis, chronic 10/07/2012  . Left shoulder pain 04/15/2012  . Preventative health care 10/22/2011  . Cervical disc disease 10/22/2011  . Bilateral hand pain 08/05/2011  . SNHL (sensorineural hearing loss) 08/04/2011  . Fibrocystic breast disease 07/02/2011  . Hypertension 07/01/2011  . GERD (gastroesophageal reflux disease) 06/29/2011  . Bloating 06/29/2011  . Back pain 03/13/2011  . Vertigo 01/04/2011  . Nystagmus 01/04/2011  . Insomnia 10/21/2010  . CONSTIPATION 08/17/2009  . Vitamin D deficiency 05/15/2009  . DYSPNEA 03/13/2009  . Depression with anxiety 01/16/2009  . Irritable bowel syndrome 01/16/2009  . Primary osteoarthritis of both hands 12/15/2008  . Diabetes ([place]) 11/03/2008  . Hyperlipidemia 11/03/2008  . Anxiety state 11/03/2008  . OVERACTIVE BLADDER 11/03/2008  . OSA (obstructive sleep apnea) 11/03/2008  . MURMUR 11/03/2008    Standley Brooking, PTA 02/08/2019, 2:05 PM  [facility] [address] [place], [place], [address] Phone: [phone]   Fax:  [phone]  Name: Laura [name] MRN: [id number] Date of Birth: January 16, 1945  Progress Note Reporting Period 11/16/18 to 02/08/19 See note below for Objective Data and Assessment of Progress/Goals.  Patient pleased with a reduction in her neck pain.  LTG #2 met at this time as well.    Mali Applegate MPT

## 2019-02-11 ENCOUNTER — Other Ambulatory Visit: Payer: Self-pay

## 2019-02-11 ENCOUNTER — Ambulatory Visit: Payer: Medicare Other | Admitting: *Deleted

## 2019-02-11 DIAGNOSIS — M25512 Pain in left shoulder: Secondary | ICD-10-CM | POA: Diagnosis not present

## 2019-02-11 DIAGNOSIS — G8929 Other chronic pain: Secondary | ICD-10-CM

## 2019-02-11 DIAGNOSIS — M542 Cervicalgia: Secondary | ICD-10-CM

## 2019-02-11 NOTE — Therapy (Signed)
[facility] [place], [place], [address] Phone: [phone]   Fax:  [phone]  Physical Therapy Treatment  Patient Details  Name: Laura [name] MRN: [id number] Date of Birth: Nov 25, 1945 Referring Provider (PT): Eunice Blase MD   Encounter Date: 02/11/2019  PT End of Session - 02/11/19 1034    Visit Number  21    Number of Visits  24    Date for PT Re-Evaluation  03/14/19    Authorization Type  PROGRESS NOTE AT 10TH VISIT.  KX MODIFIER AFTER 15 VISITS.    PT Start Time  1030    PT Stop Time  1120    PT Time Calculation (min)  50 min       Past Medical History:  Diagnosis Date  . Anxiety   . Arthritis    back of neck, bones spurs on neck  . Cataract   . Cervical disc disease   . Diabetes mellitus   . Hyperlipidemia   . Hypertension   . Mucoid cyst of joint    right thumb  . Neuropathy   . Reflux   . Sleep apnea    wears CPAP nightly  . Vertigo     Past Surgical History:  Procedure Laterality Date  . BLADDER SURGERY    . BREAST EXCISIONAL BIOPSY Bilateral   . BREAST SURGERY    . CHOLECYSTECTOMY    . fibroids removed     breast (both breasts)  . MASS EXCISION Right 06/26/2016   Procedure: EXCISION MUCOID TUMOR RIGHT THUMB, IP RIGHT THUMB;  Surgeon: Daryll Brod, MD;  Location: [place];  Service: Orthopedics;  Laterality: Right;  . PARTIAL HYSTERECTOMY      There were no vitals filed for this visit.  Subjective Assessment - 02/11/19 1031    Subjective  COVID-19 screen performed prior to patient entering clinic.  Can reach behind my back now    Pertinent History  HTN, DM, cervical disc disease.    Patient Stated Goals  Get out of pain.    Currently in Pain?  Yes    Pain Score  3     Pain Location  Shoulder    Pain Orientation  Left    Pain Descriptors / Indicators  Sore    Pain Onset  More than a month ago                       [facility] Adult PT Treatment/Exercise -  02/11/19 0001      Exercises   Exercises  Shoulder      Shoulder Exercises: Pulleys   Flexion  5 minutes    Other Pulley Exercises  standing UE ranger x 3 mins      Modalities   Modalities  Traction      Traction   Type of Traction  Cervical    Min (lbs)  5    Max (lbs)  14    Hold Time  99    Rest Time  5    Time  15      Manual Therapy   Manual Therapy  Passive ROM;Soft tissue mobilization    Soft tissue mobilization  STW to L UT to reduce TPR and tightness, STW to posterior cuff area    Passive ROM  PROM of L shoulder into flex/ER/IR with gentle holds at end range  PT Long Term Goals - 12/13/18 1315      PT LONG TERM GOAL #1   Title  Perform ADL's with pain not > 3/10.    Time  6    Period  Weeks    Status  On-going      PT LONG TERM GOAL #2   Title  Active left shoulder flexion to 125 degrees so the patient can easily reach overhead    Time  6    Period  Weeks    Status  Achieved      PT LONG TERM GOAL #3   Title  Left shoulder ER to 65 degrees.    Time  6    Period  Weeks    Status  On-going      PT LONG TERM GOAL #4   Title  Reach behind back with left hand to L3.    Time  6    Period  Weeks    Status  On-going   L glute 12/13/2018     PT LONG TERM GOAL #5   Title  Left active cervical rotation to 70 degrees.    Time  6    Period  Weeks    Status  On-going            Plan - 02/11/19 1255    Clinical Impression Statement  Pt arrived today reporting that traction continues to help with her neck and LT shldr is improving as well. Pt did fair with PROM with her cc being with ER stretching. Her IR HBB motion has improved to L4-5 now and Pt is very pleased. Traction performed at 14#s again and tolerated well. Good release of TP in LT UT and very tender posterior cuff area.    Comorbidities  HTN, DM, cervical disc disease.    Examination-Activity Limitations  Reach Overhead;Other;Lift    Examination-Participation  Restrictions  Other    Stability/Clinical Decision Making  Evolving/Moderate complexity    Rehab Potential  Good    PT Frequency  2x / week    PT Treatment/Interventions  ADLs/Self Care Home Management;Cryotherapy;Electrical Stimulation;Ultrasound;Traction;Moist Heat;Therapeutic activities;Therapeutic exercise;Manual techniques;Patient/family education;Passive range of motion;Dry needling;Joint Manipulations    PT Next Visit Plan  UE Ranger, wall ladder, PROM to left shoulder.  Modalities as needed to patient's left shoulder. Traction for neck       Patient will benefit from skilled therapeutic intervention in order to improve the following deficits and impairments:     Visit Diagnosis: Cervicalgia  Chronic left shoulder pain     Problem List Patient Active Problem List   Diagnosis Date Noted  . Acute gouty arthritis 12/17/2018  . Bilateral hearing loss 12/21/2017  . Eustachian tube disorder, bilateral 12/21/2017  . Cough 02/23/2017  . Allergic conjunctivitis 12/12/2016  . Left hand pain 05/26/2016  . Mucoid cyst, joint 05/26/2016  . Pain in finger of right hand 05/26/2016  . Primary osteoarthritis of both first carpometacarpal joints 05/26/2016  . Skin avulsion 05/26/2016  . Adhesive capsulitis of left shoulder 03/20/2016  . Fatigue 12/16/2015  . Acute bronchitis 10/31/2015  . Urinary frequency 03/09/2015  . Hearing loss, sensorineural, asymmetrical 11/09/2014  . Vestibular hypofunction, left 11/09/2014  . Morbid obesity ([place]) 07/25/2014  . Orthostatic hypotension 07/07/2014  . Upper airway cough syndrome 06/26/2014  . Diarrhea 05/02/2014  . Dizziness 04/12/2014  . Neuropathy   . Rash and nonspecific skin eruption 08/30/2013  . Paresthesia of both feet 08/30/2013  . Blurred vision 02/15/2013  .  Eye pain 02/15/2013  . Sinusitis, chronic 10/07/2012  . Left shoulder pain 04/15/2012  . Preventative health care 10/22/2011  . Cervical disc disease 10/22/2011  .  Bilateral hand pain 08/05/2011  . SNHL (sensorineural hearing loss) 08/04/2011  . Fibrocystic breast disease 07/02/2011  . Hypertension 07/01/2011  . GERD (gastroesophageal reflux disease) 06/29/2011  . Bloating 06/29/2011  . Back pain 03/13/2011  . Vertigo 01/04/2011  . Nystagmus 01/04/2011  . Insomnia 10/21/2010  . CONSTIPATION 08/17/2009  . Vitamin D deficiency 05/15/2009  . DYSPNEA 03/13/2009  . Depression with anxiety 01/16/2009  . Irritable bowel syndrome 01/16/2009  . Primary osteoarthritis of both hands 12/15/2008  . Diabetes ([place]) 11/03/2008  . Hyperlipidemia 11/03/2008  . Anxiety state 11/03/2008  . OVERACTIVE BLADDER 11/03/2008  . OSA (obstructive sleep apnea) 11/03/2008  . MURMUR 11/03/2008    Rommie Dunn,CHRIS, PTA 02/11/2019, 12:59 PM  [facility] [address] [place], [place], [address] Phone: [phone]   Fax:  [phone]  Name: Laura [name] MRN: [id number] Date of Birth: [birth year]

## 2019-02-15 ENCOUNTER — Encounter: Payer: Self-pay | Admitting: Physical Therapy

## 2019-02-15 ENCOUNTER — Other Ambulatory Visit: Payer: Self-pay

## 2019-02-15 ENCOUNTER — Ambulatory Visit: Payer: Medicare Other | Attending: Family Medicine | Admitting: Physical Therapy

## 2019-02-15 DIAGNOSIS — G8929 Other chronic pain: Secondary | ICD-10-CM | POA: Diagnosis not present

## 2019-02-15 DIAGNOSIS — M25512 Pain in left shoulder: Secondary | ICD-10-CM | POA: Insufficient documentation

## 2019-02-15 DIAGNOSIS — M542 Cervicalgia: Secondary | ICD-10-CM | POA: Diagnosis not present

## 2019-02-15 NOTE — Therapy (Addendum)
[facility] [place], [place], [address] Phone: [phone]   Fax:  [phone]  Physical Therapy Treatment  Patient Details  Name: Laura [name] MRN: [id number] Date of Birth: 15-Jan-1945 Referring Provider (PT): Eunice Blase MD   Encounter Date: 02/15/2019  PT End of Session - 02/15/19 1044    Visit Number  22    Number of Visits  24    Date for PT Re-Evaluation  03/14/19    Authorization Type  PROGRESS NOTE AT 10TH VISIT.  KX MODIFIER AFTER 15 VISITS.    PT Start Time  0945    PT Stop Time  1040    PT Time Calculation (min)  55 min    Activity Tolerance  Patient tolerated treatment well    Behavior During Therapy  WFL for tasks assessed/performed       Past Medical History:  Diagnosis Date  . Anxiety   . Arthritis    back of neck, bones spurs on neck  . Cataract   . Cervical disc disease   . Diabetes mellitus   . Hyperlipidemia   . Hypertension   . Mucoid cyst of joint    right thumb  . Neuropathy   . Reflux   . Sleep apnea    wears CPAP nightly  . Vertigo     Past Surgical History:  Procedure Laterality Date  . BLADDER SURGERY    . BREAST EXCISIONAL BIOPSY Bilateral   . BREAST SURGERY    . CHOLECYSTECTOMY    . fibroids removed     breast (both breasts)  . MASS EXCISION Right 06/26/2016   Procedure: EXCISION MUCOID TUMOR RIGHT THUMB, IP RIGHT THUMB;  Surgeon: Daryll Brod, MD;  Location: [facility];  Service: Orthopedics;  Laterality: Right;  . PARTIAL HYSTERECTOMY      There were no vitals filed for this visit.  Subjective Assessment - 02/15/19 1112    Subjective  COVID-19 screen performed prior to patient entering clinic. Reports feeling good and she is able to reach behind her head and behind her back with greater ease.    Pertinent History  HTN, DM, cervical disc disease.    Patient Stated Goals  Get out of pain.    Currently in Pain?  Yes   "a little"        [facility] PT  Assessment - 02/15/19 0001      Assessment   Medical Diagnosis  Chronic left shoulder and neck pain.    Referring Provider (PT)  Eunice Blase MD                   [facility] Adult PT Treatment/Exercise - 02/15/19 0001      Exercises   Exercises  Shoulder      Shoulder Exercises: Pulleys   Flexion  5 minutes    Other Pulley Exercises  standing UE ranger flexion, CW, CCW x 3 mins      Shoulder Exercises: ROM/Strengthening   UBE (Upper Arm Bike)  120 RPM x6 min      Modalities   Modalities  Traction      Traction   Type of Traction  Cervical    Min (lbs)  5    Max (lbs)  14    Hold Time  99    Rest Time  5    Time  15      Manual Therapy   Manual Therapy  Passive ROM;Soft tissue mobilization  Soft tissue mobilization  STW to L deltoid region to reduce TPR and tightness, STW to posterior cuff area                  PT Long Term Goals - 12/13/18 1315      PT LONG TERM GOAL #1   Title  Perform ADL's with pain not > 3/10.    Time  6    Period  Weeks    Status  On-going      PT LONG TERM GOAL #2   Title  Active left shoulder flexion to 125 degrees so the patient can easily reach overhead    Time  6    Period  Weeks    Status  Achieved      PT LONG TERM GOAL #3   Title  Left shoulder ER to 65 degrees.    Time  6    Period  Weeks    Status  On-going      PT LONG TERM GOAL #4   Title  Reach behind back with left hand to L3.    Time  6    Period  Weeks    Status  On-going   L glute 12/13/2018     PT LONG TERM GOAL #5   Title  Left active cervical rotation to 70 degrees.    Time  6    Period  Weeks    Status  On-going            Plan - 02/15/19 1044    Clinical Impression Statement  Patient responded well with no reports of increased pain with addition of UBE. Patient still with trigger point in posterior cuff but was reduced in tension after STW. Patient noted with no adverse affects to traction at 14# max pull weight.    Personal  Factors and Comorbidities  Comorbidity 1;Comorbidity 2    Comorbidities  HTN, DM, cervical disc disease.    Examination-Activity Limitations  Reach Overhead;Other;Lift    Examination-Participation Restrictions  Other    Stability/Clinical Decision Making  Evolving/Moderate complexity    Clinical Decision Making  Low    Rehab Potential  Good    PT Frequency  2x / week    PT Duration  6 weeks    PT Treatment/Interventions  ADLs/Self Care Home Management;Cryotherapy;Electrical Stimulation;Ultrasound;Traction;Moist Heat;Therapeutic activities;Therapeutic exercise;Manual techniques;Patient/family education;Passive range of motion;Dry needling;Joint Manipulations    PT Next Visit Plan  UE Ranger, wall ladder, PROM to left shoulder.  Modalities as needed to patient's left shoulder. Traction for neck    Consulted and Agree with Plan of Care  Patient       Patient will benefit from skilled therapeutic intervention in order to improve the following deficits and impairments:  Pain, Postural dysfunction, Decreased activity tolerance, Decreased range of motion, Increased muscle spasms  Visit Diagnosis: Cervicalgia  Chronic left shoulder pain     Problem List Patient Active Problem List   Diagnosis Date Noted  . Acute gouty arthritis 12/17/2018  . Bilateral hearing loss 12/21/2017  . Eustachian tube disorder, bilateral 12/21/2017  . Cough 02/23/2017  . Allergic conjunctivitis 12/12/2016  . Left hand pain 05/26/2016  . Mucoid cyst, joint 05/26/2016  . Pain in finger of right hand 05/26/2016  . Primary osteoarthritis of both first carpometacarpal joints 05/26/2016  . Skin avulsion 05/26/2016  . Adhesive capsulitis of left shoulder 03/20/2016  . Fatigue 12/16/2015  . Acute bronchitis 10/31/2015  . Urinary frequency 03/09/2015  . Hearing loss,  sensorineural, asymmetrical 11/09/2014  . Vestibular hypofunction, left 11/09/2014  . Morbid obesity ([place]) 07/25/2014  . Orthostatic hypotension  07/07/2014  . Upper airway cough syndrome 06/26/2014  . Diarrhea 05/02/2014  . Dizziness 04/12/2014  . Neuropathy   . Rash and nonspecific skin eruption 08/30/2013  . Paresthesia of both feet 08/30/2013  . Blurred vision 02/15/2013  . Eye pain 02/15/2013  . Sinusitis, chronic 10/07/2012  . Left shoulder pain 04/15/2012  . Preventative health care 10/22/2011  . Cervical disc disease 10/22/2011  . Bilateral hand pain 08/05/2011  . SNHL (sensorineural hearing loss) 08/04/2011  . Fibrocystic breast disease 07/02/2011  . Hypertension 07/01/2011  . GERD (gastroesophageal reflux disease) 06/29/2011  . Bloating 06/29/2011  . Back pain 03/13/2011  . Vertigo 01/04/2011  . Nystagmus 01/04/2011  . Insomnia 10/21/2010  . CONSTIPATION 08/17/2009  . Vitamin D deficiency 05/15/2009  . DYSPNEA 03/13/2009  . Depression with anxiety 01/16/2009  . Irritable bowel syndrome 01/16/2009  . Primary osteoarthritis of both hands 12/15/2008  . Diabetes ([place]) 11/03/2008  . Hyperlipidemia 11/03/2008  . Anxiety state 11/03/2008  . OVERACTIVE BLADDER 11/03/2008  . OSA (obstructive sleep apnea) 11/03/2008  . MURMUR 11/03/2008    Gabriela Eves, PT, DPT 02/15/2019, 11:14 AM  [facility] [place], [place], [address] Phone: [phone]   Fax:  [phone]  Name: Laura [name] MRN: [id number] Date of Birth: [birth year]

## 2019-02-18 ENCOUNTER — Other Ambulatory Visit: Payer: Self-pay

## 2019-02-18 ENCOUNTER — Ambulatory Visit: Payer: Medicare Other | Admitting: Physical Therapy

## 2019-02-18 DIAGNOSIS — M25512 Pain in left shoulder: Secondary | ICD-10-CM

## 2019-02-18 DIAGNOSIS — M542 Cervicalgia: Secondary | ICD-10-CM | POA: Diagnosis not present

## 2019-02-18 DIAGNOSIS — G8929 Other chronic pain: Secondary | ICD-10-CM

## 2019-02-18 NOTE — Therapy (Addendum)
[facility] [place], [place], [address] Phone: [phone]   Fax:  [phone]  Physical Therapy Treatment  Patient Details  Name: Laura [name] MRN: [id number] Date of Birth: 1945-11-11 Referring Provider (PT): Eunice Blase MD   Encounter Date: 02/18/2019  PT End of Session - 02/18/19 1250    Visit Number  23    Number of Visits  24    Date for PT Re-Evaluation  03/14/19    Authorization Type  PROGRESS NOTE AT 10TH VISIT.  KX MODIFIER AFTER 15 VISITS.    PT Start Time  1030    PT Stop Time  1125    PT Time Calculation (min)  55 min    Activity Tolerance  Patient tolerated treatment well    Behavior During Therapy  WFL for tasks assessed/performed       Past Medical History:  Diagnosis Date  . Anxiety   . Arthritis    back of neck, bones spurs on neck  . Cataract   . Cervical disc disease   . Diabetes mellitus   . Hyperlipidemia   . Hypertension   . Mucoid cyst of joint    right thumb  . Neuropathy   . Reflux   . Sleep apnea    wears CPAP nightly  . Vertigo     Past Surgical History:  Procedure Laterality Date  . BLADDER SURGERY    . BREAST EXCISIONAL BIOPSY Bilateral   . BREAST SURGERY    . CHOLECYSTECTOMY    . fibroids removed     breast (both breasts)  . MASS EXCISION Right 06/26/2016   Procedure: EXCISION MUCOID TUMOR RIGHT THUMB, IP RIGHT THUMB;  Surgeon: Daryll Brod, MD;  Location: [facility];  Service: Orthopedics;  Laterality: Right;  . PARTIAL HYSTERECTOMY      There were no vitals filed for this visit.  Subjective Assessment - 02/18/19 1247    Subjective  COVID-19 screen performed prior to patient entering clinic.  I'm doing much better.    Patient is accompained by:  Family member    Pertinent History  HTN, DM, cervical disc disease.    Patient Stated Goals  Get out of pain.    Currently in Pain?  Yes    Pain Score  2     Pain Location  Neck    Pain Orientation  Left     Pain Descriptors / Indicators  Sore    Pain Type  Chronic pain    Pain Onset  More than a month ago                       [facility] Adult PT Treatment/Exercise - 02/18/19 0001      Shoulder Exercises: Pulleys   Flexion  5 minutes    Other Pulley Exercises  UE ranger x 3 minutes.      Acupuncturist Location  Left middle deltoid.    Electrical Stimulation Action  Modulated x 15 minutes.    Electrical Stimulation Goals  Tone;Pain      Traction   Type of Traction  Cervical    Min (lbs)  5    Max (lbs)  14    Hold Time  99    Rest Time  5    Time  15      Manual Therapy   Manual Therapy  Soft tissue mobilization    Soft tissue mobilization  STW/M  x 3 minutes to left middle deltoid.                  PT Long Term Goals - 12/13/18 1315      PT LONG TERM GOAL #1   Title  Perform ADL's with pain not > 3/10.    Time  6    Period  Weeks    Status  On-going      PT LONG TERM GOAL #2   Title  Active left shoulder flexion to 125 degrees so the patient can easily reach overhead    Time  6    Period  Weeks    Status  Achieved      PT LONG TERM GOAL #3   Title  Left shoulder ER to 65 degrees.    Time  6    Period  Weeks    Status  On-going      PT LONG TERM GOAL #4   Title  Reach behind back with left hand to L3.    Time  6    Period  Weeks    Status  On-going   L glute 12/13/2018     PT LONG TERM GOAL #5   Title  Left active cervical rotation to 70 degrees.    Time  6    Period  Weeks    Status  On-going            Plan - 02/18/19 1253    Clinical Impression Statement  Excellent progress.  No pain complaints after treatment.    Personal Factors and Comorbidities  Comorbidity 1;Comorbidity 2    Comorbidities  HTN, DM, cervical disc disease.    Examination-Activity Limitations  Reach Overhead;Other;Lift    Examination-Participation Restrictions  Other    Stability/Clinical Decision Making   Evolving/Moderate complexity    Rehab Potential  Good    PT Frequency  2x / week    PT Duration  6 weeks    PT Treatment/Interventions  ADLs/Self Care Home Management;Cryotherapy;Electrical Stimulation;Ultrasound;Traction;Moist Heat;Therapeutic activities;Therapeutic exercise;Manual techniques;Patient/family education;Passive range of motion;Dry needling;Joint Manipulations    PT Next Visit Plan  UE Ranger, wall ladder, PROM to left shoulder.  Modalities as needed to patient's left shoulder. Traction for neck    Consulted and Agree with Plan of Care  Patient       Patient will benefit from skilled therapeutic intervention in order to improve the following deficits and impairments:  Pain, Postural dysfunction, Decreased activity tolerance, Decreased range of motion, Increased muscle spasms  Visit Diagnosis: Cervicalgia  Chronic left shoulder pain     Problem List Patient Active Problem List   Diagnosis Date Noted  . Acute gouty arthritis 12/17/2018  . Bilateral hearing loss 12/21/2017  . Eustachian tube disorder, bilateral 12/21/2017  . Cough 02/23/2017  . Allergic conjunctivitis 12/12/2016  . Left hand pain 05/26/2016  . Mucoid cyst, joint 05/26/2016  . Pain in finger of right hand 05/26/2016  . Primary osteoarthritis of both first carpometacarpal joints 05/26/2016  . Skin avulsion 05/26/2016  . Adhesive capsulitis of left shoulder 03/20/2016  . Fatigue 12/16/2015  . Acute bronchitis 10/31/2015  . Urinary frequency 03/09/2015  . Hearing loss, sensorineural, asymmetrical 11/09/2014  . Vestibular hypofunction, left 11/09/2014  . Morbid obesity ([place]) 07/25/2014  . Orthostatic hypotension 07/07/2014  . Upper airway cough syndrome 06/26/2014  . Diarrhea 05/02/2014  . Dizziness 04/12/2014  . Neuropathy   . Rash and nonspecific skin eruption 08/30/2013  . Paresthesia of both feet  08/30/2013  . Blurred vision 02/15/2013  . Eye pain 02/15/2013  . Sinusitis, chronic 10/07/2012   . Left shoulder pain 04/15/2012  . Preventative health care 10/22/2011  . Cervical disc disease 10/22/2011  . Bilateral hand pain 08/05/2011  . SNHL (sensorineural hearing loss) 08/04/2011  . Fibrocystic breast disease 07/02/2011  . Hypertension 07/01/2011  . GERD (gastroesophageal reflux disease) 06/29/2011  . Bloating 06/29/2011  . Back pain 03/13/2011  . Vertigo 01/04/2011  . Nystagmus 01/04/2011  . Insomnia 10/21/2010  . CONSTIPATION 08/17/2009  . Vitamin D deficiency 05/15/2009  . DYSPNEA 03/13/2009  . Depression with anxiety 01/16/2009  . Irritable bowel syndrome 01/16/2009  . Primary osteoarthritis of both hands 12/15/2008  . Diabetes ([place]) 11/03/2008  . Hyperlipidemia 11/03/2008  . Anxiety state 11/03/2008  . OVERACTIVE BLADDER 11/03/2008  . OSA (obstructive sleep apnea) 11/03/2008  . MURMUR 11/03/2008    Carlus Stay, [place] MPT 02/18/2019, 12:54 PM  [facility] [address] [place], [place], [address] Phone: [phone]   Fax:  [phone]  Name: Laura [name] MRN: [id number] Date of Birth: 05-Oct-1945

## 2019-02-22 ENCOUNTER — Other Ambulatory Visit: Payer: Self-pay

## 2019-02-22 ENCOUNTER — Ambulatory Visit: Payer: Medicare Other | Admitting: Physical Therapy

## 2019-02-22 DIAGNOSIS — M542 Cervicalgia: Secondary | ICD-10-CM

## 2019-02-22 DIAGNOSIS — M25512 Pain in left shoulder: Secondary | ICD-10-CM

## 2019-02-22 DIAGNOSIS — G8929 Other chronic pain: Secondary | ICD-10-CM

## 2019-02-22 NOTE — Therapy (Signed)
[facility] [place], [place], [address] Phone: [phone]   Fax:  [phone]  Physical Therapy Treatment  Patient Details  Name: LATEIA FRASER MRN: [id number] Date of Birth: [birth year] Referring Provider (PT): Eunice Blase MD   Encounter Date: 02/22/2019  PT End of Session - 02/22/19 1327    Visit Number  24    Number of Visits  24    Date for PT Re-Evaluation  03/14/19    Authorization Type  PROGRESS NOTE AT 10TH VISIT.  KX MODIFIER AFTER 15 VISITS.    PT Start Time  0100    Activity Tolerance  Patient tolerated treatment well    Behavior During Therapy  [facility] for tasks assessed/performed       Past Medical History:  Diagnosis Date  . Anxiety   . Arthritis    back of neck, bones spurs on neck  . Cataract   . Cervical disc disease   . Diabetes mellitus   . Hyperlipidemia   . Hypertension   . Mucoid cyst of joint    right thumb  . Neuropathy   . Reflux   . Sleep apnea    wears CPAP nightly  . Vertigo     Past Surgical History:  Procedure Laterality Date  . BLADDER SURGERY    . BREAST EXCISIONAL BIOPSY Bilateral   . BREAST SURGERY    . CHOLECYSTECTOMY    . fibroids removed     breast (both breasts)  . MASS EXCISION Right 06/26/2016   Procedure: EXCISION MUCOID TUMOR RIGHT THUMB, IP RIGHT THUMB;  Surgeon: Daryll Brod, MD;  Location: [facility];  Service: Orthopedics;  Laterality: Right;  . PARTIAL HYSTERECTOMY      There were no vitals filed for this visit.                    [facility] Adult PT Treatment/Exercise - 02/22/19 0001      Shoulder Exercises: Pulleys   Flexion  5 minutes      Modalities   Modalities  Electrical Stimulation;Moist Heat;Traction      Electrical Stimulation   Electrical Stimulation Location  Left shoulder.    Electrical Stimulation Action  IFC at 80-150 Hz x 15 minutes.     Electrical Stimulation Goals  Tone;Pain      Traction   Type of  Traction  Cervical    Min (lbs)  5    Max (lbs)  14    Hold Time  99    Rest Time  5    Time  15      Manual Therapy   Manual Therapy  Soft tissue mobilization    Soft tissue mobilization  STW/M x 5 minutes.                  PT Long Term Goals - 02/22/19 1303      PT LONG TERM GOAL #1   Title  Perform ADL's with pain not > 3/10.    Time  6    Period  Weeks    Status  Achieved      PT LONG TERM GOAL #2   Baseline  128 degrees.    Time  6    Period  Weeks    Status  Achieved      PT LONG TERM GOAL #3   Title  Left shoulder ER to 65 degrees.    Baseline  50 degrees.    Time  6    Status  Not Met      PT LONG TERM GOAL #4   Title  Reach behind back with left hand to L3.    Baseline  Patient able to walk 2 miles, 5 days a week without pain    Time  6    Period  Weeks    Status  Achieved              Patient will benefit from skilled therapeutic intervention in order to improve the following deficits and impairments:     Visit Diagnosis: Cervicalgia  Chronic left shoulder pain     Problem List Patient Active Problem List   Diagnosis Date Noted  . Acute gouty arthritis 12/17/2018  . Bilateral hearing loss 12/21/2017  . Eustachian tube disorder, bilateral 12/21/2017  . Cough 02/23/2017  . Allergic conjunctivitis 12/12/2016  . Left hand pain 05/26/2016  . Mucoid cyst, joint 05/26/2016  . Pain in finger of right hand 05/26/2016  . Primary osteoarthritis of both first carpometacarpal joints 05/26/2016  . Skin avulsion 05/26/2016  . Adhesive capsulitis of left shoulder 03/20/2016  . Fatigue 12/16/2015  . Acute bronchitis 10/31/2015  . Urinary frequency 03/09/2015  . Hearing loss, sensorineural, asymmetrical 11/09/2014  . Vestibular hypofunction, left 11/09/2014  . Morbid obesity ([place]) 07/25/2014  . Orthostatic hypotension 07/07/2014  . Upper airway cough syndrome 06/26/2014  . Diarrhea 05/02/2014  . Dizziness 04/12/2014  . Neuropathy    . Rash and nonspecific skin eruption 08/30/2013  . Paresthesia of both feet 08/30/2013  . Blurred vision 02/15/2013  . Eye pain 02/15/2013  . Sinusitis, chronic 10/07/2012  . Left shoulder pain 04/15/2012  . Preventative health care 10/22/2011  . Cervical disc disease 10/22/2011  . Bilateral hand pain 08/05/2011  . SNHL (sensorineural hearing loss) 08/04/2011  . Fibrocystic breast disease 07/02/2011  . Hypertension 07/01/2011  . GERD (gastroesophageal reflux disease) 06/29/2011  . Bloating 06/29/2011  . Back pain 03/13/2011  . Vertigo 01/04/2011  . Nystagmus 01/04/2011  . Insomnia 10/21/2010  . CONSTIPATION 08/17/2009  . Vitamin D deficiency 05/15/2009  . DYSPNEA 03/13/2009  . Depression with anxiety 01/16/2009  . Irritable bowel syndrome 01/16/2009  . Primary osteoarthritis of both hands 12/15/2008  . Diabetes ([place]) 11/03/2008  . Hyperlipidemia 11/03/2008  . Anxiety state 11/03/2008  . OVERACTIVE BLADDER 11/03/2008  . OSA (obstructive sleep apnea) 11/03/2008  . MURMUR 11/03/2008   PHYSICAL THERAPY DISCHARGE SUMMARY  Visits from Start of Care: 24.  Current functional level related to goals / functional outcomes: See above.   Remaining deficits: See goal section.   Education / Equipment: HEP. Plan: Patient agrees to discharge.  Patient goals were partially met. Patient is being discharged due to being pleased with the current functional level.  ?????      Shontae Rosiles, [place] MPT 02/22/2019, 1:45 PM  [facility] [address] [place], [place], [address] Phone: [phone]   Fax:  [phone]  Name: WENDOLYN RASO MRN: [id number] Date of Birth: [birth year]

## 2019-02-23 ENCOUNTER — Other Ambulatory Visit: Payer: Self-pay | Admitting: Internal Medicine

## 2019-03-21 ENCOUNTER — Other Ambulatory Visit: Payer: Self-pay | Admitting: Internal Medicine

## 2019-04-02 ENCOUNTER — Emergency Department (HOSPITAL_COMMUNITY): Payer: Medicare Other

## 2019-04-02 ENCOUNTER — Encounter (HOSPITAL_COMMUNITY): Payer: Self-pay | Admitting: *Deleted

## 2019-04-02 ENCOUNTER — Emergency Department (HOSPITAL_COMMUNITY)
Admission: EM | Admit: 2019-04-02 | Discharge: 2019-04-03 | Disposition: A | Discharge status: Home / Self Care | Payer: Medicare Other | Source: Home / Self Care | Attending: Emergency Medicine | Admitting: Emergency Medicine

## 2019-04-02 ENCOUNTER — Encounter: Payer: Self-pay | Admitting: Internal Medicine

## 2019-04-02 ENCOUNTER — Other Ambulatory Visit: Payer: Self-pay

## 2019-04-02 DIAGNOSIS — R935 Abnormal findings on diagnostic imaging of other abdominal regions, including retroperitoneum: Secondary | ICD-10-CM | POA: Insufficient documentation

## 2019-04-02 DIAGNOSIS — F418 Other specified anxiety disorders: Secondary | ICD-10-CM | POA: Diagnosis not present

## 2019-04-02 DIAGNOSIS — E119 Type 2 diabetes mellitus without complications: Secondary | ICD-10-CM | POA: Insufficient documentation

## 2019-04-02 DIAGNOSIS — E6609 Other obesity due to excess calories: Secondary | ICD-10-CM | POA: Diagnosis not present

## 2019-04-02 DIAGNOSIS — E785 Hyperlipidemia, unspecified: Secondary | ICD-10-CM | POA: Diagnosis not present

## 2019-04-02 DIAGNOSIS — R1900 Intra-abdominal and pelvic swelling, mass and lump, unspecified site: Secondary | ICD-10-CM | POA: Diagnosis not present

## 2019-04-02 DIAGNOSIS — Z882 Allergy status to sulfonamides status: Secondary | ICD-10-CM | POA: Diagnosis not present

## 2019-04-02 DIAGNOSIS — M1909 Primary osteoarthritis, other specified site: Secondary | ICD-10-CM | POA: Diagnosis not present

## 2019-04-02 DIAGNOSIS — Z8249 Family history of ischemic heart disease and other diseases of the circulatory system: Secondary | ICD-10-CM | POA: Diagnosis not present

## 2019-04-02 DIAGNOSIS — R1030 Lower abdominal pain, unspecified: Secondary | ICD-10-CM | POA: Diagnosis not present

## 2019-04-02 DIAGNOSIS — K219 Gastro-esophageal reflux disease without esophagitis: Secondary | ICD-10-CM | POA: Diagnosis not present

## 2019-04-02 DIAGNOSIS — R112 Nausea with vomiting, unspecified: Secondary | ICD-10-CM | POA: Insufficient documentation

## 2019-04-02 DIAGNOSIS — R109 Unspecified abdominal pain: Secondary | ICD-10-CM | POA: Diagnosis not present

## 2019-04-02 DIAGNOSIS — N1339 Other hydronephrosis: Secondary | ICD-10-CM | POA: Insufficient documentation

## 2019-04-02 DIAGNOSIS — Z20822 Contact with and (suspected) exposure to covid-19: Secondary | ICD-10-CM | POA: Diagnosis not present

## 2019-04-02 DIAGNOSIS — Z881 Allergy status to other antibiotic agents status: Secondary | ICD-10-CM | POA: Diagnosis not present

## 2019-04-02 DIAGNOSIS — R1031 Right lower quadrant pain: Secondary | ICD-10-CM | POA: Diagnosis not present

## 2019-04-02 DIAGNOSIS — N2889 Other specified disorders of kidney and ureter: Secondary | ICD-10-CM | POA: Diagnosis not present

## 2019-04-02 DIAGNOSIS — N1330 Unspecified hydronephrosis: Secondary | ICD-10-CM | POA: Diagnosis not present

## 2019-04-02 DIAGNOSIS — Z03818 Encounter for observation for suspected exposure to other biological agents ruled out: Secondary | ICD-10-CM | POA: Diagnosis not present

## 2019-04-02 DIAGNOSIS — Z885 Allergy status to narcotic agent status: Secondary | ICD-10-CM | POA: Diagnosis not present

## 2019-04-02 DIAGNOSIS — E1140 Type 2 diabetes mellitus with diabetic neuropathy, unspecified: Secondary | ICD-10-CM | POA: Diagnosis not present

## 2019-04-02 DIAGNOSIS — Z6833 Body mass index (BMI) 33.0-33.9, adult: Secondary | ICD-10-CM | POA: Diagnosis not present

## 2019-04-02 DIAGNOSIS — Z888 Allergy status to other drugs, medicaments and biological substances status: Secondary | ICD-10-CM | POA: Diagnosis not present

## 2019-04-02 DIAGNOSIS — Z833 Family history of diabetes mellitus: Secondary | ICD-10-CM | POA: Diagnosis not present

## 2019-04-02 DIAGNOSIS — R1909 Other intra-abdominal and pelvic swelling, mass and lump: Secondary | ICD-10-CM | POA: Diagnosis not present

## 2019-04-02 DIAGNOSIS — G4733 Obstructive sleep apnea (adult) (pediatric): Secondary | ICD-10-CM | POA: Diagnosis not present

## 2019-04-02 DIAGNOSIS — I10 Essential (primary) hypertension: Secondary | ICD-10-CM | POA: Diagnosis not present

## 2019-04-02 DIAGNOSIS — Z7984 Long term (current) use of oral hypoglycemic drugs: Secondary | ICD-10-CM | POA: Insufficient documentation

## 2019-04-02 LAB — URINALYSIS, ROUTINE W REFLEX MICROSCOPIC
Bilirubin Urine: NEGATIVE
Glucose, UA: 50 mg/dL — AB
Hgb urine dipstick: NEGATIVE
Ketones, ur: 5 mg/dL — AB
Leukocytes,Ua: NEGATIVE
Nitrite: NEGATIVE
Protein, ur: NEGATIVE mg/dL
Specific Gravity, Urine: 1.015 (ref 1.005–1.030)
pH: 7 (ref 5.0–8.0)

## 2019-04-02 LAB — CBC WITH DIFFERENTIAL/PLATELET
Abs Immature Granulocytes: 0.05 10*3/uL (ref 0.00–0.07)
Basophils Absolute: 0 10*3/uL (ref 0.0–0.1)
Basophils Relative: 0 %
Eosinophils Absolute: 0.1 10*3/uL (ref 0.0–0.5)
Eosinophils Relative: 1 %
HCT: 40.5 % (ref 36.0–46.0)
Hemoglobin: 13.6 g/dL (ref 12.0–15.0)
Immature Granulocytes: 0 %
Lymphocytes Relative: 15 %
Lymphs Abs: 1.9 10*3/uL (ref 0.7–4.0)
MCH: 29.7 pg (ref 26.0–34.0)
MCHC: 33.6 g/dL (ref 30.0–36.0)
MCV: 88.4 fL (ref 80.0–100.0)
Monocytes Absolute: 0.7 10*3/uL (ref 0.1–1.0)
Monocytes Relative: 5 %
Neutro Abs: 10.5 10*3/uL — ABNORMAL HIGH (ref 1.7–7.7)
Neutrophils Relative %: 79 %
Platelets: 245 10*3/uL (ref 150–400)
RBC: 4.58 MIL/uL (ref 3.87–5.11)
RDW: 13 % (ref 11.5–15.5)
WBC: 13.2 10*3/uL — ABNORMAL HIGH (ref 4.0–10.5)
nRBC: 0 % (ref 0.0–0.2)

## 2019-04-02 LAB — COMPREHENSIVE METABOLIC PANEL
ALT: 15 U/L (ref 0–44)
AST: 16 U/L (ref 15–41)
Albumin: 4.8 g/dL (ref 3.5–5.0)
Alkaline Phosphatase: 82 U/L (ref 38–126)
Anion gap: 12 (ref 5–15)
BUN: 13 mg/dL (ref 8–23)
CO2: 23 mmol/L (ref 22–32)
Calcium: 9.8 mg/dL (ref 8.9–10.3)
Chloride: 101 mmol/L (ref 98–111)
Creatinine, Ser: 0.69 mg/dL (ref 0.44–1.00)
GFR calc Af Amer: >60 mL/min (ref >60)
GFR calc non Af Amer: >60 mL/min (ref >60)
Glucose, Bld: 186 mg/dL — ABNORMAL HIGH (ref 70–99)
Potassium: 3.9 mmol/L (ref 3.5–5.1)
Sodium: 136 mmol/L (ref 135–145)
Total Bilirubin: 0.5 mg/dL (ref 0.3–1.2)
Total Protein: 8.1 g/dL (ref 6.5–8.1)

## 2019-04-02 LAB — LIPASE, BLOOD: Lipase: 14 U/L (ref 11–51)

## 2019-04-02 MED ORDER — SODIUM CHLORIDE 0.9 % IV BOLUS
500.0000 mL | Freq: Once | INTRAVENOUS | Status: AC
  Administered 2019-04-02: 21:00:00 500 mL via INTRAVENOUS

## 2019-04-02 MED ORDER — HYDROMORPHONE HCL 1 MG/ML IJ SOLN: 1.0000 mg | Freq: Once | INTRAMUSCULAR | Status: DC

## 2019-04-02 MED ORDER — IOHEXOL 300 MG/ML  SOLN
100.0000 mL | Freq: Once | INTRAMUSCULAR | Status: AC | PRN
  Administered 2019-04-02: 100 mL via INTRAVENOUS

## 2019-04-02 MED ORDER — ONDANSETRON HCL 4 MG/2ML IJ SOLN
4.0000 mg | Freq: Once | INTRAMUSCULAR | Status: AC
  Administered 2019-04-02: 4 mg via INTRAVENOUS
  Filled 2019-04-02: qty 2

## 2019-04-02 MED ORDER — MORPHINE SULFATE (PF) 4 MG/ML IV SOLN
4.0000 mg | Freq: Once | INTRAVENOUS | Status: AC
  Administered 2019-04-02: 4 mg via INTRAVENOUS
  Filled 2019-04-02: qty 1

## 2019-04-02 NOTE — ED Triage Notes (Signed)
Pt c/o right flank pain, abd pain that started yesterday, pt states that she has been constipated, took milk of magnesium last night with little results.

## 2019-04-02 NOTE — ED Provider Notes (Signed)
[facility] EMERGENCY DEPARTMENT Provider Note   CSN: [id number] Arrival date & time: 04/02/19  1847     History Chief Complaint  Patient presents with  . Flank Pain    Laura [name] is a 74 y.o. female with a history significant for diabetes, hyperlipidemia, hypertension and acid reflux presenting with right lower back pain which radiates into her right lower abdomen.  Her pain started in her lower yesterday evening, rather suddenly and has been intermittently persistent since then.  She endorses having constipation which is a chronic condition, and took milk of magnesia last night after which time the low back pain worsened.  She has had nausea with multiple episodes of vomiting.  She denies chest pain, shortness of breath, no known fevers or chills.  She also denies dysuria or hematuria, no history of kidney stones.  Since arriving here her pain has worsened and is now localized predominantly in the right lower quadrant.  Her last meal was around noon today and eating did not seem to worsen her symptoms.  She has found no alleviators for her pain.  The history is provided by the patient.       Past Medical History:  Diagnosis Date  . Anxiety   . Arthritis    back of neck, bones spurs on neck  . Cataract   . Cervical disc disease   . Diabetes mellitus   . Hyperlipidemia   . Hypertension   . Mucoid cyst of joint    right thumb  . Neuropathy   . Reflux   . Sleep apnea    wears CPAP nightly  . Vertigo     Patient Active Problem List   Diagnosis Date Noted  . Acute gouty arthritis 12/17/2018  . Bilateral hearing loss 12/21/2017  . Eustachian tube disorder, bilateral 12/21/2017  . Cough 02/23/2017  . Allergic conjunctivitis 12/12/2016  . Left hand pain 05/26/2016  . Mucoid cyst, joint 05/26/2016  . Pain in finger of right hand 05/26/2016  . Primary osteoarthritis of both first carpometacarpal joints 05/26/2016  . Skin avulsion 05/26/2016  . Adhesive capsulitis of left  shoulder 03/20/2016  . Fatigue 12/16/2015  . Acute bronchitis 10/31/2015  . Urinary frequency 03/09/2015  . Hearing loss, sensorineural, asymmetrical 11/09/2014  . Vestibular hypofunction, left 11/09/2014  . Morbid obesity ([place]) 07/25/2014  . Orthostatic hypotension 07/07/2014  . Upper airway cough syndrome 06/26/2014  . Diarrhea 05/02/2014  . Dizziness 04/12/2014  . Neuropathy   . Rash and nonspecific skin eruption 08/30/2013  . Paresthesia of both feet 08/30/2013  . Blurred vision 02/15/2013  . Eye pain 02/15/2013  . Sinusitis, chronic 10/07/2012  . Left shoulder pain 04/15/2012  . Preventative health care 10/22/2011  . Cervical disc disease 10/22/2011  . Bilateral hand pain 08/05/2011  . SNHL (sensorineural hearing loss) 08/04/2011  . Fibrocystic breast disease 07/02/2011  . Hypertension 07/01/2011  . GERD (gastroesophageal reflux disease) 06/29/2011  . Bloating 06/29/2011  . Back pain 03/13/2011  . Vertigo 01/04/2011  . Nystagmus 01/04/2011  . Insomnia 10/21/2010  . CONSTIPATION 08/17/2009  . Vitamin D deficiency 05/15/2009  . DYSPNEA 03/13/2009  . Depression with anxiety 01/16/2009  . Irritable bowel syndrome 01/16/2009  . Primary osteoarthritis of both hands 12/15/2008  . Diabetes ([place]) 11/03/2008  . Hyperlipidemia 11/03/2008  . Anxiety state 11/03/2008  . OVERACTIVE BLADDER 11/03/2008  . OSA (obstructive sleep apnea) 11/03/2008  . MURMUR 11/03/2008    Past Surgical History:  Procedure Laterality Date  . BLADDER  SURGERY    . BREAST EXCISIONAL BIOPSY Bilateral   . BREAST SURGERY    . CHOLECYSTECTOMY    . fibroids removed     breast (both breasts)  . MASS EXCISION Right 06/26/2016   Procedure: EXCISION MUCOID TUMOR RIGHT THUMB, IP RIGHT THUMB;  Surgeon: Daryll Brod, MD;  Location: [place];  Service: Orthopedics;  Laterality: Right;  . PARTIAL HYSTERECTOMY       OB History   No obstetric history on file.     Family History  Problem  Relation Age of Onset  . Hypertension Other   . Diabetes Father   . Hypertension Father   . Diabetes Sister   . Hypertension Mother   . Hypertension Sister   . Stroke Sister   . Diabetes Maternal Aunt   . Colon cancer Neg Hx   . Esophageal cancer Neg Hx   . Stomach cancer Neg Hx   . Rectal cancer Neg Hx     Social History   Tobacco Use  . Smoking status: Never Smoker  . Smokeless tobacco: Never Used  Substance Use Topics  . Alcohol use: No    Alcohol/week: 0.0 standard drinks  . Drug use: No    Home Medications Prior to Admission medications   Medication Sig Start Date End Date Taking? Authorizing Provider  acetaminophen (TYLENOL) 325 MG tablet Take 325 mg by mouth as needed for mild pain, moderate pain, fever or headache.    Yes [provider]  amLODipine (NORVASC) 5 MG tablet TAKE ONE (1) TABLET BY MOUTH EVERY DAY Patient taking differently: Take 5 mg by mouth daily.  02/23/19  Yes Biagio Borg, MD  Aspirin-Acetaminophen-Caffeine ([place]) 520-260-32.5 MG PACK  08/16/18  Yes [provider]  Blood Glucose Monitoring Suppl (ONE TOUCH ULTRA 2) w/Device KIT Use as directed 04/06/15  Yes Biagio Borg, MD  Cyanocobalamin (VITAMIN B-12 PO) Take by mouth.   Yes [provider]  glimepiride (AMARYL) 2 MG tablet TAKE ONETABLET BY MOUTH DAILY 08/25/18  Yes Biagio Borg, MD  indomethacin (INDOCIN) 50 MG capsule Take 1 capsule (50 mg total) by mouth 3 (three) times daily as needed for moderate pain. 12/17/18  Yes Biagio Borg, MD  Lancets [facility] ULTRASOFT) lancets USE TO CHECK BLOOD SUGAR TWICE DAILY 06/14/18  Yes Biagio Borg, MD  loperamide (IMODIUM) 2 MG capsule Take by mouth as needed for diarrhea or loose stools.   Yes [provider]  LORazepam (ATIVAN) 0.5 MG tablet TAKE 1 TABLET BY MOUTH 2 TIMES DAILY AS NEEDED FOR ANXIETY Patient taking differently: Take 0.5 mg by mouth 2 (two) times daily.  10/19/18  Yes Biagio Borg, MD    [facility] ULTRA test strip USE TO CHECK BLOOD SUGARS TWO TIMES DAILY 06/14/18  Yes Biagio Borg, MD  pantoprazole (PROTONIX) 40 MG tablet TAKE 1 TABLET (40 MG TOTAL) BY MOUTH DAILY. 03/21/19 03/20/20 Yes Biagio Borg, MD  solifenacin (VESICARE) 5 MG tablet Take 5 mg by mouth daily. 03/21/19  Yes [provider]  oxyCODONE-acetaminophen (PERCOCET/ROXICET) 5-325 MG tablet Take 1 tablet by mouth every 4 (four) hours as needed for severe pain. 04/03/19   Evalee Jefferson, PA-C  oxyCODONE-acetaminophen (PERCOCET/ROXICET) 5-325 MG tablet Take 1 tablet by mouth every 4 (four) hours as needed. 04/03/19   Evalee Jefferson, PA-C    Allergies    Hydrocodone-homatropine, Sulfa antibiotics, Augmentin [amoxicillin-pot clavulanate], Codeine, Crestor [rosuvastatin calcium], Doxycycline, Keflex [cephalexin], Lipitor [atorvastatin calcium], Naproxen,  and Prednisone  Review of Systems   Review of Systems  Constitutional: Negative for chills and fever.  HENT: Negative for congestion and sore throat.   Eyes: Negative.   Respiratory: Negative for chest tightness and shortness of breath.   Cardiovascular: Negative for chest pain.  Gastrointestinal: Positive for abdominal pain, nausea and vomiting.  Genitourinary: Positive for flank pain. Negative for dysuria and hematuria.  Musculoskeletal: Negative for arthralgias, joint swelling and neck pain.  Skin: Negative.  Negative for rash and wound.  Neurological: Negative for dizziness, weakness, light-headedness, numbness and headaches.  Psychiatric/Behavioral: Negative.     Physical Exam Updated Vital Signs BP (!) 166/86   Pulse 98   Temp 98.4 F (36.9 C) (Oral)   Resp 20   Ht 5' 4" (1.626 m)   Wt 88.9 kg   SpO2 99%   BMI 33.64 kg/m   Physical Exam Vitals and nursing note reviewed.  Constitutional:      Appearance: She is well-developed.  HENT:     Head: Normocephalic and atraumatic.  Eyes:     Conjunctiva/sclera: Conjunctivae normal.  Cardiovascular:      Rate and Rhythm: Normal rate and regular rhythm.     Heart sounds: Normal heart sounds.  Pulmonary:     Effort: Pulmonary effort is normal.     Breath sounds: Normal breath sounds. No wheezing.  Abdominal:     General: Abdomen is protuberant. Bowel sounds are normal. There is distension.     Palpations: Abdomen is soft.     Tenderness: There is abdominal tenderness in the right lower quadrant. There is no right CVA tenderness, guarding or rebound. Negative signs include Murphy's sign and McBurney's sign.     Comments: Soft generalized abdominal distention.  There is no increased tympany to percussion.  Bowel sounds are normal.  Musculoskeletal:        General: Normal range of motion.     Cervical back: Normal range of motion.  Skin:    General: Skin is warm and dry.  Neurological:     Mental Status: She is alert.     ED Results / Procedures / Treatments   Labs (all labs ordered are listed, but only abnormal results are displayed) Labs Reviewed  CBC WITH DIFFERENTIAL/PLATELET - Abnormal; Notable for the following components:      Result Value   WBC 13.2 (*)    Neutro Abs 10.5 (*)    All other components within normal limits  COMPREHENSIVE METABOLIC PANEL - Abnormal; Notable for the following components:   Glucose, Bld 186 (*)    All other components within normal limits  URINALYSIS, ROUTINE W REFLEX MICROSCOPIC - Abnormal; Notable for the following components:   Color, Urine STRAW (*)    Glucose, UA 50 (*)    Ketones, ur 5 (*)    All other components within normal limits  LIPASE, BLOOD    EKG None  Radiology CT ABDOMEN PELVIS W CONTRAST  Result Date: 04/02/2019 CLINICAL DATA:  Right flank and abdominal pain, began yesterday, constipation acute though fall EXAM: CT ABDOMEN AND PELVIS WITH CONTRAST TECHNIQUE: Multidetector CT imaging of the abdomen and pelvis was performed using the standard protocol following bolus administration of intravenous contrast. CONTRAST:  152m  OMNIPAQUE IOHEXOL 300 MG/ML  SOLN COMPARISON:  None. FINDINGS: Lower chest: Lung bases are clear. Normal heart size. No pericardial effusion. Hepatobiliary: No focal liver abnormality is seen. Patient is post cholecystectomy. Slight prominence of the biliary tree likely related to reservoir effect.  No calcified intraductal gallstones. Pancreas: Unremarkable. No pancreatic ductal dilatation or surrounding inflammatory changes. Spleen: Normal in size without focal abnormality. Adrenals/Urinary Tract: Normal adrenal glands. No visible or contour deforming renal lesions. There is mild moderate to severe right hydroureteronephrosis with a an suspected ureteral obstruction at the level the soft tissue lesions in the retroperitoneum further detailed below. There is associated moderate right perinephric stranding. More mild left hydronephrosis is seen to the level of the distended urinary bladder. The left ureter courses in close proximity to several retroperitoneal soft tissue and cystic lesions but does not appear frankly occluded at these levels. No obstructive urolithiasis is evident. The bladder itself is markedly distended with chest a pronounced cystocele. Stomach/Bowel: Small hiatal hernia. Distal stomach is unremarkable. The second portion of the duodenum courses in close proximity to the soft tissue attenuation lesions in the retroperitoneum including loss of discernible fat plane at the level of the distal second portion (7/30). No small bowel dilatation or wall thickening though several additional loops of small bowel do also course in close proximity to the retroperitoneal lesions (2/54, 5/56). No evidence of small-bowel obstruction. No colonic dilatation or wall thickening. A normal appendix is visualized. Focal nodular enhancement at the 10 o'clock position at the level of the rectum may reflect an internal hemorrhoid. Vascular/Lymphatic: There are mixed solid and cystic lesions within the retroperitoneum and  centered predominantly in the aortocaval interval and tracking into the right iliac chain. Largest solid component appears to measure 5.9 x 4.3 x 6.4 cm in size though accurate determination is difficult given the infiltrative appearance. A more intermediate attenuation possibly cystic component seen to the left of the aorta measures approximately 4 x 3.6 x 4.6 cm in size. The soft tissue lesions partially encase the abdominal aorta as well as the IMA origin, proximal right renal artery, and right common iliac. Furthermore, there is compression of the IVC with loss of clearly discernible fat plane and at least a small segment concerning for possible intraluminal extension (6/59). Reproductive: Uterus is surgically absent. No concerning adnexal lesions. Other: Retroperitoneal mixed solid and cystic appearing lesions, as above. Musculoskeletal: Multilevel degenerative changes are present in the imaged portions of the spine. No acute osseous abnormality or suspicious osseous lesion. No lytic or sclerotic bony lesions are seen. IMPRESSION: 1. Mixed solid and cystic appearing retroperitoneal lesions which may reflect conglomerate nodal mass lesions or metastases some of which may be necrotic. Alternative differential could include retroperitoneal fibrosis which is less favored given the overall masslike appearance and encasement rather than occlusion of the vessels as detailed above. Recommend correlation with patient exam findings and history. 2. Severe right hydroureteronephrosis to the level of the mid ureter where there is likely occlusion by the retroperitoneal lesions above. 3. Moderate left hydronephrosis appears secondary to a distended urinary bladder with large cystocele. No obstructive urolithiasis. 4. Focal nodular enhancement at the 10 o'clock position at the level of the rectum with rectocele may reflect an internal hemorrhoid. Could correlate with exam findings. These results were called by telephone at  the time of interpretation on 04/02/2019 at 10:24 pm to provider   , who verbally acknowledged these results. Electronically Signed   By: Lovena Le M.D.   On: 04/02/2019 22:25    Procedures Procedures (including critical care time)  Medications Ordered in ED Medications  ondansetron (ZOFRAN) injection 4 mg (4 mg Intravenous Given 04/02/19 2050)  morphine 4 MG/ML injection 4 mg (4 mg Intravenous Given 04/02/19 2050)  sodium  chloride 0.9 % bolus 500 mL (0 mLs Intravenous Stopped 04/02/19 2239)  iohexol (OMNIPAQUE) 300 MG/ML solution 100 mL (100 mLs Intravenous Contrast Given 04/02/19 2151)  morphine 4 MG/ML injection 4 mg (4 mg Intravenous Given 04/03/19 0025)    ED Course  I have reviewed the triage vital signs and the nursing notes.  Pertinent labs & imaging results that were available during my care of the patient were reviewed by me and considered in my medical decision making (see chart for details).    MDM Rules/Calculators/A&P                     Results of CT scan were discussed with Dr. Diona Fanti who also reviewed the CT images.  Although she does have significant hydronephrosis on the right secondary to this mass encroachment, she has a normal creatinine level and she is urinating without difficulty.  She does not need emergent stenting at this time and it was felt she can go home with close office follow-up.  He suspects lymphoma as the probable diagnosis.  He did request a post void residual scan which was completed with 140 cc of urine retained in the bladder.  Additional pertinent information about patient's presentation is she denies having history of night sweats or unexplained weight loss.  Also discussed patient's results with Dr. Delton Coombes.  He will plan close office follow-up with this patient.  He has requested that we order an outpatient IR biopsy.  This has been ordered.   Patient's labs and imaging was reviewed and discussed with patient.  She did obtain  prolonged pain relief with morphine, relief lasted for 4 hours, she was given an additional dose of morphine prior to discharge home.  Also given oxycodone prepack along with prescription for pain control.  Discussed the results of her CT imaging, including the possibility that this is a cancer.  IR biopsy was ordered per Dr. Tomie China request.  Patient was given referrals to both Dr. Delton Coombes and Dr. Diona Fanti.  She understands to call on Monday for appointments with these MDs.  She is also aware that radiology should be contacting her to arrange this outpatient biopsy.  Also discussed return precautions, particularly if she has uncontrolled pain, which time she would benefit from admission for pain control.  Hopefully oxycodone will control her symptoms at home. Final Clinical Impression(s) / ED Diagnoses Final diagnoses:  Retroperitoneal mass  Other hydronephrosis    Rx / DC Orders ED Discharge Orders         Ordered    oxyCODONE-acetaminophen (PERCOCET/ROXICET) 5-325 MG tablet  Every 4 hours PRN     04/03/19 0036    oxyCODONE-acetaminophen (PERCOCET/ROXICET) 5-325 MG tablet  Every 4 hours PRN     04/03/19 0037    CT GUIDED NEEDLE PLACEMENT  Status:  Canceled     04/03/19 0051    CT BIOPSY     04/03/19 0057           Evalee Jefferson, PA-C 04/03/19 0109    Milton Ferguson, MD 04/05/19 1028

## 2019-04-03 ENCOUNTER — Encounter (HOSPITAL_COMMUNITY): Payer: Self-pay | Admitting: Emergency Medicine

## 2019-04-03 ENCOUNTER — Inpatient Hospital Stay (HOSPITAL_COMMUNITY)
Admission: EM | Admit: 2019-04-03 | Discharge: 2019-04-05 | DRG: 392 | Disposition: A | Discharge status: Home / Self Care | Payer: Medicare Other | Attending: Internal Medicine | Admitting: Internal Medicine

## 2019-04-03 ENCOUNTER — Other Ambulatory Visit: Payer: Self-pay

## 2019-04-03 DIAGNOSIS — E1140 Type 2 diabetes mellitus with diabetic neuropathy, unspecified: Secondary | ICD-10-CM | POA: Diagnosis present

## 2019-04-03 DIAGNOSIS — E669 Obesity, unspecified: Secondary | ICD-10-CM

## 2019-04-03 DIAGNOSIS — E6609 Other obesity due to excess calories: Secondary | ICD-10-CM | POA: Diagnosis present

## 2019-04-03 DIAGNOSIS — Z881 Allergy status to other antibiotic agents status: Secondary | ICD-10-CM

## 2019-04-03 DIAGNOSIS — Z20822 Contact with and (suspected) exposure to covid-19: Secondary | ICD-10-CM | POA: Diagnosis present

## 2019-04-03 DIAGNOSIS — Z888 Allergy status to other drugs, medicaments and biological substances status: Secondary | ICD-10-CM

## 2019-04-03 DIAGNOSIS — N1330 Unspecified hydronephrosis: Secondary | ICD-10-CM | POA: Diagnosis present

## 2019-04-03 DIAGNOSIS — M1909 Primary osteoarthritis, other specified site: Secondary | ICD-10-CM | POA: Diagnosis present

## 2019-04-03 DIAGNOSIS — E785 Hyperlipidemia, unspecified: Secondary | ICD-10-CM | POA: Diagnosis present

## 2019-04-03 DIAGNOSIS — Z833 Family history of diabetes mellitus: Secondary | ICD-10-CM

## 2019-04-03 DIAGNOSIS — I10 Essential (primary) hypertension: Secondary | ICD-10-CM | POA: Diagnosis present

## 2019-04-03 DIAGNOSIS — G4733 Obstructive sleep apnea (adult) (pediatric): Secondary | ICD-10-CM | POA: Diagnosis present

## 2019-04-03 DIAGNOSIS — R1909 Other intra-abdominal and pelvic swelling, mass and lump: Principal | ICD-10-CM | POA: Diagnosis present

## 2019-04-03 DIAGNOSIS — E119 Type 2 diabetes mellitus without complications: Secondary | ICD-10-CM

## 2019-04-03 DIAGNOSIS — F418 Other specified anxiety disorders: Secondary | ICD-10-CM | POA: Diagnosis present

## 2019-04-03 DIAGNOSIS — Z8249 Family history of ischemic heart disease and other diseases of the circulatory system: Secondary | ICD-10-CM

## 2019-04-03 DIAGNOSIS — R1900 Intra-abdominal and pelvic swelling, mass and lump, unspecified site: Secondary | ICD-10-CM | POA: Diagnosis not present

## 2019-04-03 DIAGNOSIS — Z885 Allergy status to narcotic agent status: Secondary | ICD-10-CM

## 2019-04-03 DIAGNOSIS — R109 Unspecified abdominal pain: Secondary | ICD-10-CM | POA: Diagnosis present

## 2019-04-03 DIAGNOSIS — Z6833 Body mass index (BMI) 33.0-33.9, adult: Secondary | ICD-10-CM

## 2019-04-03 DIAGNOSIS — R1031 Right lower quadrant pain: Secondary | ICD-10-CM

## 2019-04-03 DIAGNOSIS — K219 Gastro-esophageal reflux disease without esophagitis: Secondary | ICD-10-CM | POA: Diagnosis present

## 2019-04-03 DIAGNOSIS — Z882 Allergy status to sulfonamides status: Secondary | ICD-10-CM

## 2019-04-03 LAB — CBC WITH DIFFERENTIAL/PLATELET
Abs Immature Granulocytes: 0.05 10*3/uL (ref 0.00–0.07)
Basophils Absolute: 0 10*3/uL (ref 0.0–0.1)
Basophils Relative: 0 %
Eosinophils Absolute: 0.1 10*3/uL (ref 0.0–0.5)
Eosinophils Relative: 0 %
HCT: 37.9 % (ref 36.0–46.0)
Hemoglobin: 12.7 g/dL (ref 12.0–15.0)
Immature Granulocytes: 0 %
Lymphocytes Relative: 17 %
Lymphs Abs: 2 10*3/uL (ref 0.7–4.0)
MCH: 29.5 pg (ref 26.0–34.0)
MCHC: 33.5 g/dL (ref 30.0–36.0)
MCV: 87.9 fL (ref 80.0–100.0)
Monocytes Absolute: 0.7 10*3/uL (ref 0.1–1.0)
Monocytes Relative: 6 %
Neutro Abs: 8.8 10*3/uL — ABNORMAL HIGH (ref 1.7–7.7)
Neutrophils Relative %: 77 %
Platelets: 243 10*3/uL (ref 150–400)
RBC: 4.31 MIL/uL (ref 3.87–5.11)
RDW: 13.1 % (ref 11.5–15.5)
WBC: 11.6 10*3/uL — ABNORMAL HIGH (ref 4.0–10.5)
nRBC: 0 % (ref 0.0–0.2)

## 2019-04-03 LAB — COMPREHENSIVE METABOLIC PANEL
ALT: 14 U/L (ref 0–44)
AST: 18 U/L (ref 15–41)
Albumin: 4.4 g/dL (ref 3.5–5.0)
Alkaline Phosphatase: 75 U/L (ref 38–126)
Anion gap: 10 (ref 5–15)
BUN: 14 mg/dL (ref 8–23)
CO2: 24 mmol/L (ref 22–32)
Calcium: 9.7 mg/dL (ref 8.9–10.3)
Chloride: 102 mmol/L (ref 98–111)
Creatinine, Ser: 0.82 mg/dL (ref 0.44–1.00)
GFR calc Af Amer: >60 mL/min (ref >60)
GFR calc non Af Amer: >60 mL/min (ref >60)
Glucose, Bld: 180 mg/dL — ABNORMAL HIGH (ref 70–99)
Potassium: 3.6 mmol/L (ref 3.5–5.1)
Sodium: 136 mmol/L (ref 135–145)
Total Bilirubin: 0.5 mg/dL (ref 0.3–1.2)
Total Protein: 7.8 g/dL (ref 6.5–8.1)

## 2019-04-03 LAB — URINALYSIS, ROUTINE W REFLEX MICROSCOPIC
Bilirubin Urine: NEGATIVE
Glucose, UA: NEGATIVE mg/dL
Hgb urine dipstick: NEGATIVE
Ketones, ur: NEGATIVE mg/dL
Leukocytes,Ua: NEGATIVE
Nitrite: NEGATIVE
Protein, ur: NEGATIVE mg/dL
Specific Gravity, Urine: 1.006 (ref 1.005–1.030)
pH: 6 (ref 5.0–8.0)

## 2019-04-03 LAB — GLUCOSE, CAPILLARY: Glucose-Capillary: 174 mg/dL — ABNORMAL HIGH (ref 70–99)

## 2019-04-03 MED ORDER — OXYCODONE-ACETAMINOPHEN 5-325 MG PO TABS: 1.0000 | ORAL_TABLET | ORAL | 0 refills | Status: DC | PRN

## 2019-04-03 MED ORDER — PANTOPRAZOLE SODIUM 40 MG PO TBEC
40.0000 mg | DELAYED_RELEASE_TABLET | Freq: Every day | ORAL | Status: DC
  Administered 2019-04-04 – 2019-04-05 (×2): 40 mg via ORAL
  Filled 2019-04-03 (×2): qty 1

## 2019-04-03 MED ORDER — ONDANSETRON HCL 4 MG PO TABS
4.0000 mg | ORAL_TABLET | Freq: Four times a day (QID) | ORAL | Status: DC | PRN
  Administered 2019-04-03: 4 mg via ORAL
  Filled 2019-04-03: qty 1

## 2019-04-03 MED ORDER — MORPHINE SULFATE (PF) 4 MG/ML IV SOLN
4.0000 mg | Freq: Once | INTRAVENOUS | Status: AC
  Administered 2019-04-03: 4 mg via INTRAVENOUS
  Filled 2019-04-03: qty 1

## 2019-04-03 MED ORDER — ACETAMINOPHEN 650 MG RE SUPP: 650.0000 mg | Freq: Four times a day (QID) | RECTAL | Status: DC | PRN

## 2019-04-03 MED ORDER — AMLODIPINE BESYLATE 5 MG PO TABS
5.0000 mg | ORAL_TABLET | Freq: Every day | ORAL | Status: DC
  Administered 2019-04-04 – 2019-04-05 (×2): 5 mg via ORAL
  Filled 2019-04-03 (×2): qty 1

## 2019-04-03 MED ORDER — ONDANSETRON HCL 4 MG/2ML IJ SOLN
4.0000 mg | Freq: Four times a day (QID) | INTRAMUSCULAR | Status: DC | PRN
  Administered 2019-04-04: 4 mg via INTRAVENOUS
  Filled 2019-04-03: qty 2

## 2019-04-03 MED ORDER — MORPHINE SULFATE (PF) 4 MG/ML IV SOLN
4.0000 mg | INTRAVENOUS | Status: DC | PRN
  Administered 2019-04-03 – 2019-04-05 (×7): 4 mg via INTRAVENOUS
  Filled 2019-04-03 (×7): qty 1

## 2019-04-03 MED ORDER — ONDANSETRON HCL 4 MG/2ML IJ SOLN
4.0000 mg | Freq: Once | INTRAMUSCULAR | Status: AC
  Administered 2019-04-03: 4 mg via INTRAVENOUS
  Filled 2019-04-03: qty 2

## 2019-04-03 MED ORDER — TRAMADOL HCL 50 MG PO TABS: 50.0000 mg | ORAL_TABLET | Freq: Four times a day (QID) | ORAL | 0 refills | Status: DC | PRN

## 2019-04-03 MED ORDER — DARIFENACIN HYDROBROMIDE ER 7.5 MG PO TB24
7.5000 mg | ORAL_TABLET | Freq: Every day | ORAL | Status: DC
  Administered 2019-04-04 – 2019-04-05 (×2): 7.5 mg via ORAL
  Filled 2019-04-03 (×2): qty 1

## 2019-04-03 MED ORDER — INSULIN ASPART 100 UNIT/ML ~~LOC~~ SOLN
0.0000 [IU] | Freq: Three times a day (TID) | SUBCUTANEOUS | Status: DC
  Administered 2019-04-04: 2 [IU] via SUBCUTANEOUS
  Administered 2019-04-05: 1 [IU] via SUBCUTANEOUS
  Administered 2019-04-05 (×2): 2 [IU] via SUBCUTANEOUS

## 2019-04-03 MED ORDER — ACETAMINOPHEN 325 MG PO TABS: 650.0000 mg | ORAL_TABLET | Freq: Four times a day (QID) | ORAL | Status: DC | PRN

## 2019-04-03 MED ORDER — LORAZEPAM 0.5 MG PO TABS: 0.5000 mg | ORAL_TABLET | Freq: Two times a day (BID) | ORAL | Status: DC | PRN

## 2019-04-03 NOTE — Telephone Encounter (Signed)
Called pt today and she stated that she went to [facility] last night and did a CT scan. Their recommendation is to follow up with 2 specialists due to the imaging result. Pt stated that she is feeling better today and wanted to make sure that she should follow up with the specialists as directed. Inform pt that PCP would agree with the recommendations and that Dr. Jenny Reichmann has the results and the notes from the ED.

## 2019-04-03 NOTE — H&P (Signed)
History and Physical    Laura [name] XIP:[id number] DOB: 17-Aug-1945 DOA: 04/03/2019  PCP: Biagio Borg, MD   Patient coming from: Home.  I have personally briefly reviewed patient's old medical records in [place]  Chief Complaint: Abdominal pain.  HPI: Laura [name] is a 74 y.o. female with medical history significant of anxiety, osteoarthritis of the neck, cataracts, cervical disc disease, type 2 diabetes, hyperlipidemia, hypertension, mucoid cyst of the right thumb, neuropathy, reflux, sleep apnea not using CPAP regularly, vertigo who is coming again to the emergency department due to right lower abdominal pain after being discharge earlier in the morning from the ED due to right flank pain secondary to retroperitoneal masses causing hydronephrosis of the kidneys without renal function impairment.  The patient states that she went home around 2 in the morning without much pain and was able to sleep until 8:30 in the morning.  She went to church and was doing well until she tried to eat lunch in the afternoon.  After that, the patient has had a recurrence of her right flank/right lower quadrant pain, which is more intense than it was last night.  She denies fever, chills, nausea or vomiting, diarrhea, constipation, melena or hematochezia.  No dysuria, frequency or hematuria.  She denies chest pain, palpitations, dizziness, diaphoresis, PND, orthopnea or pitting edema of the lower extremities.  No sore throat, dyspnea, wheezing or hemoptysis.  No polyuria, polydipsia, polyphagia or blurred vision.  ED Course: Initial vital signs were temperature 99 F, pulse 87, respiration 18, blood pressure 177/88 mmHg and O2 sat 100% on room air.  The patient received 4 mg of morphine and 4 mg of Zofran in the ED.  She is currently having very minimal pain.  Her urinalysis was unremarkable.  CBC showed a white count of 11.6 with 77% neutrophils and 17% lymphocytes, hemoglobin 12.7 g/dL and platelets  343.  CMP was normal, except for a glucose of 180 mg/dL.  CT shows mid solid and cystic appearing retroperitoneal lesions which may reflect conglomerate nodal mass lesions or metastasis some of which may be necrotic.  There is severe right and moderate left hydroureteronephrosis.  Please see images and full radiology report for further detail.  Review of Systems: As per HPI otherwise 10 point review of systems negative.   Past Medical History:  Diagnosis Date  . Anxiety   . Arthritis    back of neck, bones spurs on neck  . Cataract   . Cervical disc disease   . Diabetes mellitus   . Hyperlipidemia   . Hypertension   . Mucoid cyst of joint    right thumb  . Neuropathy   . Reflux   . Sleep apnea    wears CPAP nightly  . Vertigo     Past Surgical History:  Procedure Laterality Date  . BLADDER SURGERY    . BREAST EXCISIONAL BIOPSY Bilateral   . BREAST SURGERY    . CHOLECYSTECTOMY    . fibroids removed     breast (both breasts)  . MASS EXCISION Right 06/26/2016   Procedure: EXCISION MUCOID TUMOR RIGHT THUMB, IP RIGHT THUMB;  Surgeon: Daryll Brod, MD;  Location: [facility];  Service: Orthopedics;  Laterality: Right;  . PARTIAL HYSTERECTOMY       reports that she has never smoked. She has never used smokeless tobacco. She reports that she does not drink alcohol or use drugs.  Allergies  Allergen Reactions  . Hydrocodone-Homatropine Other (See  Comments)    Vertigo *pt strongly prefers to never take*  . Sulfa Antibiotics     Tongue swells, hives, itching  . Augmentin [Amoxicillin-Pot Clavulanate]     Diarrhea; can take PCN/ Amoxicillin  . Codeine     itch  . Crestor [Rosuvastatin Calcium] Other (See Comments)    Did something to memory   . Doxycycline   . Keflex [Cephalexin] Diarrhea and Nausea And Vomiting  . Lipitor [Atorvastatin Calcium] Other (See Comments)    Makes weak   . Naproxen Other (See Comments)    Stomach cramps  . Prednisone Other (See  Comments)    Nervous *pt strongly prefers to never be given prednisone*     Family History  Problem Relation Age of Onset  . Hypertension Other   . Diabetes Father   . Hypertension Father   . Diabetes Sister   . Hypertension Mother   . Hypertension Sister   . Stroke Sister   . Diabetes Maternal Aunt   . Colon cancer Neg Hx   . Esophageal cancer Neg Hx   . Stomach cancer Neg Hx   . Rectal cancer Neg Hx    Prior to Admission medications   Medication Sig Start Date End Date Taking? Authorizing Provider  acetaminophen (TYLENOL) 325 MG tablet Take 325 mg by mouth as needed for mild pain, moderate pain, fever or headache.    Yes [provider]  amLODipine (NORVASC) 5 MG tablet TAKE ONE (1) TABLET BY MOUTH EVERY DAY Patient taking differently: Take 5 mg by mouth daily.  02/23/19  Yes Biagio Borg, MD  Aspirin-Acetaminophen-Caffeine (Gem) 520-260-32.5 MG PACK  08/16/18  Yes [provider]  Blood Glucose Monitoring Suppl (ONE TOUCH ULTRA 2) w/Device KIT Use as directed 04/06/15  Yes Biagio Borg, MD  Cyanocobalamin (VITAMIN B-12 PO) Take by mouth.   Yes [provider]  glimepiride (AMARYL) 2 MG tablet TAKE ONETABLET BY MOUTH DAILY 08/25/18  Yes Biagio Borg, MD  indomethacin (INDOCIN) 50 MG capsule Take 1 capsule (50 mg total) by mouth 3 (three) times daily as needed for moderate pain. 12/17/18  Yes Biagio Borg, MD  Lancets [facility] ULTRASOFT) lancets USE TO CHECK BLOOD SUGAR TWICE DAILY 06/14/18  Yes Biagio Borg, MD  loperamide (IMODIUM) 2 MG capsule Take by mouth as needed for diarrhea or loose stools.   Yes [provider]  LORazepam (ATIVAN) 0.5 MG tablet TAKE 1 TABLET BY MOUTH 2 TIMES DAILY AS NEEDED FOR ANXIETY Patient taking differently: Take 0.5 mg by mouth 2 (two) times daily.  10/19/18  Yes Biagio Borg, MD  [facility] ULTRA test strip USE TO CHECK BLOOD SUGARS TWO TIMES DAILY 06/14/18  Yes Biagio Borg, MD  pantoprazole  (PROTONIX) 40 MG tablet TAKE 1 TABLET (40 MG TOTAL) BY MOUTH DAILY. 03/21/19 03/20/20 Yes Biagio Borg, MD  solifenacin (VESICARE) 5 MG tablet Take 5 mg by mouth daily. 03/21/19  Yes [provider]  traMADol (ULTRAM) 50 MG tablet Take 1 tablet (50 mg total) by mouth every 6 (six) hours as needed. 04/03/19  Yes Evalee Jefferson, PA-C    Physical Exam: Vitals:   04/03/19 1759 04/03/19 1802  BP:  (!) 177/88  Pulse:  87  Resp:  18  Temp:  99 F (37.2 C)  TempSrc:  Oral  SpO2:  100%  Weight: 88.9 kg   Height: _0  (1.626 m)     Constitutional: NAD, calm, comfortable Eyes: PERRL,  lids and conjunctivae normal ENMT: Mucous membranes are moist. Posterior pharynx clear of any exudate or lesions. Neck: normal, supple, no masses, no thyromegaly Respiratory: Decreased breath sounds in bases, otherwise clear to auscultation bilaterally, no wheezing, no crackles. Normal respiratory effort. No accessory muscle use.  Cardiovascular: Regular rate and rhythm, no murmurs / rubs / gallops. No extremity edema. 2+ pedal pulses. No carotid bruits.  Abdomen: Nondistended, obese.  Soft, positive right flank and RUQ tenderness, no guarding or rebound, no masses palpated. No hepatosplenomegaly. Bowel sounds positive.  Musculoskeletal: no clubbing / cyanosis.  Good ROM, no contractures. Normal muscle tone.  Skin: no rashes, lesions, ulcers on limited dermatological examination Neurologic: CN 2-12 grossly intact. Sensation intact, DTR normal. Strength 5/5 in all 4.  Psychiatric: Normal judgment and insight. Alert and oriented x 3. Normal mood.   Labs on Admission: I have personally reviewed following labs and imaging studies  CBC: Recent Labs  Lab 04/02/19 2100 04/03/19 1820  WBC 13.2* 11.6*  NEUTROABS 10.5* 8.8*  HGB 13.6 12.7  HCT 40.5 37.9  MCV 88.4 87.9  PLT 245 426   Basic Metabolic Panel: Recent Labs  Lab 04/02/19 2100 04/03/19 1820  NA 136 136  K 3.9 3.6  CL 101 102  CO2 23 24  GLUCOSE  186* 180*  BUN 13 14  CREATININE 0.69 0.82  CALCIUM 9.8 9.7   GFR: Estimated Creatinine Clearance: 65 mL/min (by C-G formula based on SCr of 0.82 mg/dL). Liver Function Tests: Recent Labs  Lab 04/02/19 2100 04/03/19 1820  AST 16 18  ALT 15 14  ALKPHOS 82 75  BILITOT 0.5 0.5  PROT 8.1 7.8  ALBUMIN 4.8 4.4   Recent Labs  Lab 04/02/19 2100  LIPASE 14   No results for input(s): AMMONIA in the last 168 hours. Coagulation Profile: No results for input(s): INR, PROTIME in the last 168 hours. Cardiac Enzymes: No results for input(s): CKTOTAL, CKMB, CKMBINDEX, TROPONINI in the last 168 hours. BNP (last 3 results) No results for input(s): PROBNP in the last 8760 hours. HbA1C: No results for input(s): HGBA1C in the last 72 hours. CBG: No results for input(s): GLUCAP in the last 168 hours. Lipid Profile: No results for input(s): CHOL, HDL, LDLCALC, TRIG, CHOLHDL, LDLDIRECT in the last 72 hours. Thyroid Function Tests: No results for input(s): TSH, T4TOTAL, FREET4, T3FREE, THYROIDAB in the last 72 hours. Anemia Panel: No results for input(s): VITAMINB12, FOLATE, FERRITIN, TIBC, IRON, RETICCTPCT in the last 72 hours. Urine analysis:    Component Value Date/Time   COLORURINE STRAW (A) 04/03/2019 1955   APPEARANCEUR CLEAR 04/03/2019 1955   LABSPEC 1.006 04/03/2019 1955   PHURINE 6.0 04/03/2019 1955   GLUCOSEU NEGATIVE 04/03/2019 1955   GLUCOSEU NEGATIVE 03/09/2018 [place] 04/03/2019 1955   HGBUR negative 05/15/2009 [place] 04/03/2019 1955   BILIRUBINUR negative 12/02/2017 1142   [place] 04/03/2019 1955   PROTEINUR NEGATIVE 04/03/2019 1955   UROBILINOGEN 0.2 03/09/2018 1554   NITRITE NEGATIVE 04/03/2019 1955   LEUKOCYTESUR NEGATIVE 04/03/2019 1955    Radiological Exams on Admission: CT ABDOMEN PELVIS W CONTRAST  Result Date: 04/02/2019 CLINICAL DATA:  Right flank and abdominal pain, began yesterday, constipation acute though  fall EXAM: CT ABDOMEN AND PELVIS WITH CONTRAST TECHNIQUE: Multidetector CT imaging of the abdomen and pelvis was performed using the standard protocol following bolus administration of intravenous contrast. CONTRAST:  170m OMNIPAQUE IOHEXOL 300 MG/ML  SOLN COMPARISON:  None. FINDINGS: Lower chest: Lung  bases are clear. Normal heart size. No pericardial effusion. Hepatobiliary: No focal liver abnormality is seen. Patient is post cholecystectomy. Slight prominence of the biliary tree likely related to reservoir effect. No calcified intraductal gallstones. Pancreas: Unremarkable. No pancreatic ductal dilatation or surrounding inflammatory changes. Spleen: Normal in size without focal abnormality. Adrenals/Urinary Tract: Normal adrenal glands. No visible or contour deforming renal lesions. There is mild moderate to severe right hydroureteronephrosis with a an suspected ureteral obstruction at the level the soft tissue lesions in the retroperitoneum further detailed below. There is associated moderate right perinephric stranding. More mild left hydronephrosis is seen to the level of the distended urinary bladder. The left ureter courses in close proximity to several retroperitoneal soft tissue and cystic lesions but does not appear frankly occluded at these levels. No obstructive urolithiasis is evident. The bladder itself is markedly distended with chest a pronounced cystocele. Stomach/Bowel: Small hiatal hernia. Distal stomach is unremarkable. The second portion of the duodenum courses in close proximity to the soft tissue attenuation lesions in the retroperitoneum including loss of discernible fat plane at the level of the distal second portion (7/30). No small bowel dilatation or wall thickening though several additional loops of small bowel do also course in close proximity to the retroperitoneal lesions (2/54, 5/56). No evidence of small-bowel obstruction. No colonic dilatation or wall thickening. A normal  appendix is visualized. Focal nodular enhancement at the 10 o'clock position at the level of the rectum may reflect an internal hemorrhoid. Vascular/Lymphatic: There are mixed solid and cystic lesions within the retroperitoneum and centered predominantly in the aortocaval interval and tracking into the right iliac chain. Largest solid component appears to measure 5.9 x 4.3 x 6.4 cm in size though accurate determination is difficult given the infiltrative appearance. A more intermediate attenuation possibly cystic component seen to the left of the aorta measures approximately 4 x 3.6 x 4.6 cm in size. The soft tissue lesions partially encase the abdominal aorta as well as the IMA origin, proximal right renal artery, and right common iliac. Furthermore, there is compression of the IVC with loss of clearly discernible fat plane and at least a small segment concerning for possible intraluminal extension (6/59). Reproductive: Uterus is surgically absent. No concerning adnexal lesions. Other: Retroperitoneal mixed solid and cystic appearing lesions, as above. Musculoskeletal: Multilevel degenerative changes are present in the imaged portions of the spine. No acute osseous abnormality or suspicious osseous lesion. No lytic or sclerotic bony lesions are seen. IMPRESSION: 1. Mixed solid and cystic appearing retroperitoneal lesions which may reflect conglomerate nodal mass lesions or metastases some of which may be necrotic. Alternative differential could include retroperitoneal fibrosis which is less favored given the overall masslike appearance and encasement rather than occlusion of the vessels as detailed above. Recommend correlation with patient exam findings and history. 2. Severe right hydroureteronephrosis to the level of the mid ureter where there is likely occlusion by the retroperitoneal lesions above. 3. Moderate left hydronephrosis appears secondary to a distended urinary bladder with large cystocele. No  obstructive urolithiasis. 4. Focal nodular enhancement at the 10 o'clock position at the level of the rectum with rectocele may reflect an internal hemorrhoid. Could correlate with exam findings. These results were called by telephone at the time of interpretation on 04/02/2019 at 10:24 pm to provider JULIE IDOL , who verbally acknowledged these results. Electronically Signed   By: Lovena Le M.D.   On: 04/02/2019 22:25    EKG: Independently reviewed.   Assessment/Plan Principal Problem:  Retroperitoneal mass Observation/MedSurg. Keep n.p.o. Analgesics as needed. Antiemetics as needed. Oncology evaluation in a.m. CT-guided biopsy has been requested.  Active Problems:   Type 2 diabetes mellitus (HCC) Currently n.p.o. Hold Amaryl. CBG monitoring with RI SS. Check hemoglobin A1c.    Hyperlipidemia On lifestyle modifications. Follow-up with PCP.    Depression with anxiety Continue lorazepam as needed.    OSA (obstructive sleep apnea) She thinks she will be all right at the moment. Advised to let us know if she wants to use one of our devices.    GERD (gastroesophageal reflux disease) Continue PPI.    Hypertension Continue amlodipine 5 mg p.o. daily. Monitor blood pressure.   DVT prophylaxis: SCDs. Code Status: Full code. Family Communication: Disposition Plan: Observation for pain control and oncology consult in a.m. Consults called: Routine oncology consult. Admission status: Observation/MedSurg.  Reubin Milan MD [facility]  If 7PM-7AM, please contact night-coverage [email or web address]  04/03/2019, 9:04 PM   This document was prepared using Dragon voice recognition software and may contain some unintended transcription errors

## 2019-04-03 NOTE — ED Provider Notes (Signed)
[facility] EMERGENCY DEPARTMENT Provider Note   CSN: [id number] Arrival date & time: 04/03/19  1739     History Chief Complaint  Patient presents with  . Abdominal Pain    Laura [name] is a 74 y.o. female with a history most significant for diabetes, hypertension, GERD, who was seen here last night for right lower flank and lower abdominal pain along with nausea and vomiting and diagnosed with retroperitoneal lesions concerning for cystic mass versus metastasis and causing compression of the right ureter causing significant right hydronephrosis.  After consultation with Dr. Diona Fanti with urology and also with Dr. Delton Coombes of oncology, patient was discharged home with close follow-up with these 2 providers, also in outpatient IR biopsy was ordered for early this week.  She returns today for worsening abdominal pain.  She was treated with morphine last night and obtained significant pain relief and was able to sleep throughout the night.  After eating lunch today her pain returned and is severe again in the right lower flank and right lower abdomen which has not responded to her home tramadol.  She denies fevers or chills, no nausea or vomiting.  She has been able to urinate.  At the time of her discharge yesterday evening she was offered oxycodone for pain relief, which she deferred which she thought would be too strong and in the past has caused hallucinations.  She was advised to return here if her pain worsened.  The history is provided by the patient.       Past Medical History:  Diagnosis Date  . Anxiety   . Arthritis    back of neck, bones spurs on neck  . Cataract   . Cervical disc disease   . Diabetes mellitus   . Hyperlipidemia   . Hypertension   . Mucoid cyst of joint    right thumb  . Neuropathy   . Reflux   . Sleep apnea    wears CPAP nightly  . Vertigo     Patient Active Problem List   Diagnosis Date Noted  . Retroperitoneal mass 04/03/2019  . Acute gouty  arthritis 12/17/2018  . Bilateral hearing loss 12/21/2017  . Eustachian tube disorder, bilateral 12/21/2017  . Cough 02/23/2017  . Allergic conjunctivitis 12/12/2016  . Left hand pain 05/26/2016  . Mucoid cyst, joint 05/26/2016  . Pain in finger of right hand 05/26/2016  . Primary osteoarthritis of both first carpometacarpal joints 05/26/2016  . Skin avulsion 05/26/2016  . Adhesive capsulitis of left shoulder 03/20/2016  . Fatigue 12/16/2015  . Acute bronchitis 10/31/2015  . Urinary frequency 03/09/2015  . Hearing loss, sensorineural, asymmetrical 11/09/2014  . Vestibular hypofunction, left 11/09/2014  . Morbid obesity ([place]) 07/25/2014  . Orthostatic hypotension 07/07/2014  . Upper airway cough syndrome 06/26/2014  . Diarrhea 05/02/2014  . Dizziness 04/12/2014  . Neuropathy   . Rash and nonspecific skin eruption 08/30/2013  . Paresthesia of both feet 08/30/2013  . Blurred vision 02/15/2013  . Eye pain 02/15/2013  . Sinusitis, chronic 10/07/2012  . Left shoulder pain 04/15/2012  . Preventative health care 10/22/2011  . Cervical disc disease 10/22/2011  . Bilateral hand pain 08/05/2011  . SNHL (sensorineural hearing loss) 08/04/2011  . Fibrocystic breast disease 07/02/2011  . Hypertension 07/01/2011  . GERD (gastroesophageal reflux disease) 06/29/2011  . Bloating 06/29/2011  . Back pain 03/13/2011  . Vertigo 01/04/2011  . Nystagmus 01/04/2011  . Insomnia 10/21/2010  . CONSTIPATION 08/17/2009  . Vitamin D deficiency 05/15/2009  .  DYSPNEA 03/13/2009  . Depression with anxiety 01/16/2009  . Irritable bowel syndrome 01/16/2009  . Primary osteoarthritis of both hands 12/15/2008  . Diabetes ([place]) 11/03/2008  . Hyperlipidemia 11/03/2008  . Anxiety state 11/03/2008  . OVERACTIVE BLADDER 11/03/2008  . OSA (obstructive sleep apnea) 11/03/2008  . MURMUR 11/03/2008    Past Surgical History:  Procedure Laterality Date  . BLADDER SURGERY    . BREAST EXCISIONAL BIOPSY  Bilateral   . BREAST SURGERY    . CHOLECYSTECTOMY    . fibroids removed     breast (both breasts)  . MASS EXCISION Right 06/26/2016   Procedure: EXCISION MUCOID TUMOR RIGHT THUMB, IP RIGHT THUMB;  Surgeon: Daryll Brod, MD;  Location: [facility];  Service: Orthopedics;  Laterality: Right;  . PARTIAL HYSTERECTOMY       OB History   No obstetric history on file.     Family History  Problem Relation Age of Onset  . Hypertension Other   . Diabetes Father   . Hypertension Father   . Diabetes Sister   . Hypertension Mother   . Hypertension Sister   . Stroke Sister   . Diabetes Maternal Aunt   . Colon cancer Neg Hx   . Esophageal cancer Neg Hx   . Stomach cancer Neg Hx   . Rectal cancer Neg Hx     Social History   Tobacco Use  . Smoking status: Never Smoker  . Smokeless tobacco: Never Used  Substance Use Topics  . Alcohol use: No    Alcohol/week: 0.0 standard drinks  . Drug use: No    Home Medications Prior to Admission medications   Medication Sig Start Date End Date Taking? Authorizing Provider  acetaminophen (TYLENOL) 325 MG tablet Take 325 mg by mouth as needed for mild pain, moderate pain, fever or headache.    Yes [provider]  amLODipine (NORVASC) 5 MG tablet TAKE ONE (1) TABLET BY MOUTH EVERY DAY Patient taking differently: Take 5 mg by mouth daily.  02/23/19  Yes Biagio Borg, MD  Aspirin-Acetaminophen-Caffeine ([place]) 520-260-32.5 MG PACK  08/16/18  Yes [provider]  Blood Glucose Monitoring Suppl (ONE TOUCH ULTRA 2) w/Device KIT Use as directed 04/06/15  Yes Biagio Borg, MD  Cyanocobalamin (VITAMIN B-12 PO) Take by mouth.   Yes [provider]  glimepiride (AMARYL) 2 MG tablet TAKE ONETABLET BY MOUTH DAILY 08/25/18  Yes Biagio Borg, MD  indomethacin (INDOCIN) 50 MG capsule Take 1 capsule (50 mg total) by mouth 3 (three) times daily as needed for moderate pain. 12/17/18  Yes Biagio Borg, MD  Lancets  [facility] ULTRASOFT) lancets USE TO CHECK BLOOD SUGAR TWICE DAILY 06/14/18  Yes Biagio Borg, MD  loperamide (IMODIUM) 2 MG capsule Take by mouth as needed for diarrhea or loose stools.   Yes [provider]  LORazepam (ATIVAN) 0.5 MG tablet TAKE 1 TABLET BY MOUTH 2 TIMES DAILY AS NEEDED FOR ANXIETY Patient taking differently: Take 0.5 mg by mouth 2 (two) times daily.  10/19/18  Yes Biagio Borg, MD  [facility] ULTRA test strip USE TO CHECK BLOOD SUGARS TWO TIMES DAILY 06/14/18  Yes Biagio Borg, MD  pantoprazole (PROTONIX) 40 MG tablet TAKE 1 TABLET (40 MG TOTAL) BY MOUTH DAILY. 03/21/19 03/20/20 Yes Biagio Borg, MD  solifenacin (VESICARE) 5 MG tablet Take 5 mg by mouth daily. 03/21/19  Yes [provider]  traMADol (ULTRAM) 50 MG tablet Take 1 tablet (  50 mg total) by mouth every 6 (six) hours as needed. 04/03/19  Yes Hlee Fringer, Almyra Free, PA-C    Allergies    Hydrocodone-homatropine, Sulfa antibiotics, Augmentin [amoxicillin-pot clavulanate], Codeine, Crestor [rosuvastatin calcium], Doxycycline, Keflex [cephalexin], Lipitor [atorvastatin calcium], Naproxen, and Prednisone  Review of Systems   Review of Systems  Constitutional: Negative for chills and fever.  HENT: Negative for congestion.   Eyes: Negative.   Respiratory: Negative for chest tightness and shortness of breath.   Cardiovascular: Negative for chest pain.  Gastrointestinal: Positive for abdominal pain. Negative for constipation, diarrhea, nausea and vomiting.  Genitourinary: Negative.  Negative for difficulty urinating.  Musculoskeletal: Negative for arthralgias, joint swelling and neck pain.  Skin: Negative.  Negative for rash and wound.  Neurological: Negative for dizziness, weakness, light-headedness, numbness and headaches.  Psychiatric/Behavioral: Negative.     Physical Exam Updated Vital Signs BP (!) 177/88 (BP Location: Left Arm)   Pulse 87   Temp 99 F (37.2 C) (Oral)   Resp 18   Ht 5' 4"  (1.626 m)   Wt 88.9  kg   SpO2 100%   BMI 33.64 kg/m   Physical Exam Vitals and nursing note reviewed.  Constitutional:      Appearance: She is well-developed.  HENT:     Head: Normocephalic and atraumatic.  Eyes:     Conjunctiva/sclera: Conjunctivae normal.  Cardiovascular:     Rate and Rhythm: Normal rate and regular rhythm.     Heart sounds: Normal heart sounds.  Pulmonary:     Effort: Pulmonary effort is normal.     Breath sounds: Normal breath sounds. No wheezing.  Abdominal:     General: Abdomen is protuberant. Bowel sounds are normal.     Palpations: Abdomen is soft.     Tenderness: There is abdominal tenderness in the right lower quadrant. There is no right CVA tenderness, left CVA tenderness, guarding or rebound.  Musculoskeletal:        General: Normal range of motion.     Cervical back: Normal range of motion.  Skin:    General: Skin is warm and dry.  Neurological:     Mental Status: She is alert.     ED Results / Procedures / Treatments   Labs (all labs ordered are listed, but only abnormal results are displayed) Labs Reviewed  CBC WITH DIFFERENTIAL/PLATELET - Abnormal; Notable for the following components:      Result Value   WBC 11.6 (*)    Neutro Abs 8.8 (*)    All other components within normal limits  COMPREHENSIVE METABOLIC PANEL - Abnormal; Notable for the following components:   Glucose, Bld 180 (*)    All other components within normal limits  URINALYSIS, ROUTINE W REFLEX MICROSCOPIC - Abnormal; Notable for the following components:   Color, Urine STRAW (*)    All other components within normal limits  SARS CORONAVIRUS 2 (TAT 6-24 HRS)  CBC WITH DIFFERENTIAL/PLATELET    EKG None  Radiology CT ABDOMEN PELVIS W CONTRAST  Result Date: 04/02/2019 CLINICAL DATA:  Right flank and abdominal pain, began yesterday, constipation acute though fall EXAM: CT ABDOMEN AND PELVIS WITH CONTRAST TECHNIQUE: Multidetector CT imaging of the abdomen and pelvis was performed using  the standard protocol following bolus administration of intravenous contrast. CONTRAST:  168m OMNIPAQUE IOHEXOL 300 MG/ML  SOLN COMPARISON:  None. FINDINGS: Lower chest: Lung bases are clear. Normal heart size. No pericardial effusion. Hepatobiliary: No focal liver abnormality is seen. Patient is post cholecystectomy. Slight prominence of the  biliary tree likely related to reservoir effect. No calcified intraductal gallstones. Pancreas: Unremarkable. No pancreatic ductal dilatation or surrounding inflammatory changes. Spleen: Normal in size without focal abnormality. Adrenals/Urinary Tract: Normal adrenal glands. No visible or contour deforming renal lesions. There is mild moderate to severe right hydroureteronephrosis with a an suspected ureteral obstruction at the level the soft tissue lesions in the retroperitoneum further detailed below. There is associated moderate right perinephric stranding. More mild left hydronephrosis is seen to the level of the distended urinary bladder. The left ureter courses in close proximity to several retroperitoneal soft tissue and cystic lesions but does not appear frankly occluded at these levels. No obstructive urolithiasis is evident. The bladder itself is markedly distended with chest a pronounced cystocele. Stomach/Bowel: Small hiatal hernia. Distal stomach is unremarkable. The second portion of the duodenum courses in close proximity to the soft tissue attenuation lesions in the retroperitoneum including loss of discernible fat plane at the level of the distal second portion (7/30). No small bowel dilatation or wall thickening though several additional loops of small bowel do also course in close proximity to the retroperitoneal lesions (2/54, 5/56). No evidence of small-bowel obstruction. No colonic dilatation or wall thickening. A normal appendix is visualized. Focal nodular enhancement at the 10 o'clock position at the level of the rectum may reflect an internal  hemorrhoid. Vascular/Lymphatic: There are mixed solid and cystic lesions within the retroperitoneum and centered predominantly in the aortocaval interval and tracking into the right iliac chain. Largest solid component appears to measure 5.9 x 4.3 x 6.4 cm in size though accurate determination is difficult given the infiltrative appearance. A more intermediate attenuation possibly cystic component seen to the left of the aorta measures approximately 4 x 3.6 x 4.6 cm in size. The soft tissue lesions partially encase the abdominal aorta as well as the IMA origin, proximal right renal artery, and right common iliac. Furthermore, there is compression of the IVC with loss of clearly discernible fat plane and at least a small segment concerning for possible intraluminal extension (6/59). Reproductive: Uterus is surgically absent. No concerning adnexal lesions. Other: Retroperitoneal mixed solid and cystic appearing lesions, as above. Musculoskeletal: Multilevel degenerative changes are present in the imaged portions of the spine. No acute osseous abnormality or suspicious osseous lesion. No lytic or sclerotic bony lesions are seen. IMPRESSION: 1. Mixed solid and cystic appearing retroperitoneal lesions which may reflect conglomerate nodal mass lesions or metastases some of which may be necrotic. Alternative differential could include retroperitoneal fibrosis which is less favored given the overall masslike appearance and encasement rather than occlusion of the vessels as detailed above. Recommend correlation with patient exam findings and history. 2. Severe right hydroureteronephrosis to the level of the mid ureter where there is likely occlusion by the retroperitoneal lesions above. 3. Moderate left hydronephrosis appears secondary to a distended urinary bladder with [name] cystocele. No obstructive urolithiasis. 4. Focal nodular enhancement at the 10 o'clock position at the level of the rectum with rectocele may reflect  an internal hemorrhoid. Could correlate with exam findings. These results were called by telephone at the time of interpretation on 04/02/2019 at 10:24 pm to provider Makylie Rivere , who verbally acknowledged these results. Electronically Signed   By: Lovena Le M.D.   On: 04/02/2019 22:25    Procedures Procedures (including critical care time)  Medications Ordered in ED Medications  morphine 4 MG/ML injection 4 mg (4 mg Intravenous Given 04/03/19 2053)  acetaminophen (TYLENOL) tablet 650 mg (has  no administration in time range)    Or  acetaminophen (TYLENOL) suppository 650 mg (has no administration in time range)  ondansetron (ZOFRAN) tablet 4 mg (4 mg Oral Given 04/03/19 2052)    Or  ondansetron (ZOFRAN) injection 4 mg ( Intravenous See Alternative 04/03/19 2052)  morphine 4 MG/ML injection 4 mg (4 mg Intravenous Given 04/03/19 1834)  ondansetron (ZOFRAN) injection 4 mg (4 mg Intravenous Given 04/03/19 1833)    ED Course  I have reviewed the triage vital signs and the nursing notes.  Pertinent labs & imaging results that were available during my care of the patient were reviewed by me and considered in my medical decision making (see chart for details).    MDM Rules/Calculators/A&P                      Patient labs are stable this evening.  An IV was started and she was given IV morphine and Zofran and had adequate relief of pain symptoms.  She will require admission for pain control.  A post void bladder scan was obtained with 80 cc of urine, she is not retaining urine at this time.  Discussed with Dr. Olevia Bowens who accepts pt for admission.    Final Clinical Impression(s) / ED Diagnoses Final diagnoses:  Right lower quadrant abdominal pain  Retroperitoneal mass    Rx / DC Orders ED Discharge Orders    None       Landis Martins 04/03/19 2056    Milton Ferguson, MD 04/05/19 1025

## 2019-04-03 NOTE — Discharge Instructions (Addendum)
Please call Dr Eulogio Ditch and Dr. Delton Coombes on Monday to arrange office visits with each MD this week.  You will be contacted by [facility] to schedule the biopsy you need as discussed - this should happen this week.  You may use the pain medicine given and prescribed for pain relief as needed. This will make you drowsy - do not drive within 4 hours of taking this medicine. If your pain is not well controlled at home,  return here for reassessment of your pain.   We have cancelled the prescription for the oxycodone since you do not tolerate this medicine.  Pick up the tramadol from your pharmacy.

## 2019-04-03 NOTE — ED Triage Notes (Signed)
Patient c/o right lower abd pain. Patient recently discharged from ED at 2am this morning after having pain in right flank that radiated into right lower abd. Patient is to follow up with urologist and cancer center for biopsy after lesions and mass found. Patient states pain was under control until she ate today and then pain returned despite taking prescribed tramadol/

## 2019-04-04 ENCOUNTER — Inpatient Hospital Stay (HOSPITAL_COMMUNITY): Payer: Medicare Other

## 2019-04-04 DIAGNOSIS — Z881 Allergy status to other antibiotic agents status: Secondary | ICD-10-CM | POA: Diagnosis not present

## 2019-04-04 DIAGNOSIS — Z833 Family history of diabetes mellitus: Secondary | ICD-10-CM | POA: Diagnosis not present

## 2019-04-04 DIAGNOSIS — Z882 Allergy status to sulfonamides status: Secondary | ICD-10-CM | POA: Diagnosis not present

## 2019-04-04 DIAGNOSIS — G4733 Obstructive sleep apnea (adult) (pediatric): Secondary | ICD-10-CM | POA: Diagnosis present

## 2019-04-04 DIAGNOSIS — E1140 Type 2 diabetes mellitus with diabetic neuropathy, unspecified: Secondary | ICD-10-CM | POA: Diagnosis not present

## 2019-04-04 DIAGNOSIS — R1030 Lower abdominal pain, unspecified: Secondary | ICD-10-CM

## 2019-04-04 DIAGNOSIS — R1909 Other intra-abdominal and pelvic swelling, mass and lump: Secondary | ICD-10-CM | POA: Diagnosis present

## 2019-04-04 DIAGNOSIS — F418 Other specified anxiety disorders: Secondary | ICD-10-CM | POA: Diagnosis present

## 2019-04-04 DIAGNOSIS — R1031 Right lower quadrant pain: Secondary | ICD-10-CM | POA: Diagnosis not present

## 2019-04-04 DIAGNOSIS — Z6833 Body mass index (BMI) 33.0-33.9, adult: Secondary | ICD-10-CM | POA: Diagnosis not present

## 2019-04-04 DIAGNOSIS — E6609 Other obesity due to excess calories: Secondary | ICD-10-CM | POA: Diagnosis present

## 2019-04-04 DIAGNOSIS — R1900 Intra-abdominal and pelvic swelling, mass and lump, unspecified site: Secondary | ICD-10-CM | POA: Diagnosis not present

## 2019-04-04 DIAGNOSIS — I700 Atherosclerosis of aorta: Secondary | ICD-10-CM | POA: Diagnosis not present

## 2019-04-04 DIAGNOSIS — E785 Hyperlipidemia, unspecified: Secondary | ICD-10-CM | POA: Diagnosis present

## 2019-04-04 DIAGNOSIS — R590 Localized enlarged lymph nodes: Secondary | ICD-10-CM | POA: Diagnosis not present

## 2019-04-04 DIAGNOSIS — Z8249 Family history of ischemic heart disease and other diseases of the circulatory system: Secondary | ICD-10-CM | POA: Diagnosis not present

## 2019-04-04 DIAGNOSIS — Z20822 Contact with and (suspected) exposure to covid-19: Secondary | ICD-10-CM | POA: Diagnosis present

## 2019-04-04 DIAGNOSIS — K219 Gastro-esophageal reflux disease without esophagitis: Secondary | ICD-10-CM | POA: Diagnosis not present

## 2019-04-04 DIAGNOSIS — R109 Unspecified abdominal pain: Secondary | ICD-10-CM | POA: Diagnosis present

## 2019-04-04 DIAGNOSIS — N1330 Unspecified hydronephrosis: Secondary | ICD-10-CM | POA: Diagnosis present

## 2019-04-04 DIAGNOSIS — I10 Essential (primary) hypertension: Secondary | ICD-10-CM | POA: Diagnosis not present

## 2019-04-04 DIAGNOSIS — M1909 Primary osteoarthritis, other specified site: Secondary | ICD-10-CM | POA: Diagnosis present

## 2019-04-04 DIAGNOSIS — Z885 Allergy status to narcotic agent status: Secondary | ICD-10-CM | POA: Diagnosis not present

## 2019-04-04 DIAGNOSIS — Z888 Allergy status to other drugs, medicaments and biological substances status: Secondary | ICD-10-CM | POA: Diagnosis not present

## 2019-04-04 LAB — CBC WITH DIFFERENTIAL/PLATELET
Abs Immature Granulocytes: 0.04 10*3/uL (ref 0.00–0.07)
Basophils Absolute: 0 10*3/uL (ref 0.0–0.1)
Basophils Relative: 0 %
Eosinophils Absolute: 0.1 10*3/uL (ref 0.0–0.5)
Eosinophils Relative: 1 %
HCT: 37.6 % (ref 36.0–46.0)
Hemoglobin: 12 g/dL (ref 12.0–15.0)
Immature Granulocytes: 0 %
Lymphocytes Relative: 24 %
Lymphs Abs: 2.8 10*3/uL (ref 0.7–4.0)
MCH: 28.8 pg (ref 26.0–34.0)
MCHC: 31.9 g/dL (ref 30.0–36.0)
MCV: 90.2 fL (ref 80.0–100.0)
Monocytes Absolute: 0.9 10*3/uL (ref 0.1–1.0)
Monocytes Relative: 8 %
Neutro Abs: 7.9 10*3/uL — ABNORMAL HIGH (ref 1.7–7.7)
Neutrophils Relative %: 67 %
Platelets: 234 10*3/uL (ref 150–400)
RBC: 4.17 MIL/uL (ref 3.87–5.11)
RDW: 12.9 % (ref 11.5–15.5)
WBC: 11.8 10*3/uL — ABNORMAL HIGH (ref 4.0–10.5)
nRBC: 0 % (ref 0.0–0.2)

## 2019-04-04 LAB — SARS CORONAVIRUS 2 (TAT 6-24 HRS): SARS Coronavirus 2: NEGATIVE

## 2019-04-04 LAB — HEMOGLOBIN A1C
Hgb A1c MFr Bld: 7.3 % — ABNORMAL HIGH (ref 4.8–5.6)
Mean Plasma Glucose: 162.81 mg/dL

## 2019-04-04 LAB — GLUCOSE, CAPILLARY
Glucose-Capillary: 130 mg/dL — ABNORMAL HIGH (ref 70–99)
Glucose-Capillary: 190 mg/dL — ABNORMAL HIGH (ref 70–99)
Glucose-Capillary: 140 mg/dL — ABNORMAL HIGH (ref 70–99)
Glucose-Capillary: 159 mg/dL — ABNORMAL HIGH (ref 70–99)

## 2019-04-04 MED ORDER — IOHEXOL 300 MG/ML  SOLN
75.0000 mL | Freq: Once | INTRAMUSCULAR | Status: AC | PRN
  Administered 2019-04-04: 75 mL via INTRAVENOUS

## 2019-04-04 NOTE — Consult Note (Signed)
[facility] Consultation Oncology  Name: Laura [name]      MRN: [id number]    Location: [id number]  Date: 04/04/2019 Time:5:56 PM   REFERRING PHYSICIAN: Dr. Dyann Kief  REASON FOR CONSULT: Retroperitoneal masses   DIAGNOSIS: Under work-up  HISTORY OF PRESENT ILLNESS: Laura [name] is a 74 year old very pleasant African-American female who is seen in consultation today for further work-up and management of retroperitoneal masses.  She reported 3-week history of on and off right flank pain which radiated to the right groin region.  This pain was dull achy and lasted variable amount of time each time.  She had to take Goody powder at home.  She presented to the ER on 04/02/2019.  A CT scan of the abdomen and pelvis with contrast showed mixed solid and cystic appearing retroperitoneal lesions which may reflect conglomerate nodal mass lesions or meta stasis some of which may be necrotic.  Retroperitoneal fibrosis was less favored given the overall masslike appearance and encasement of the vessels.  She denies any hematuria.  She was sent from with a prescription for tramadol.  Patient never filled the prescription as she is afraid to take any stronger pain medication.  She apparently had episodes of hallucinations with Percocet and vertigo with hydrocodone cough syrup in the past.  Denies any fevers, night sweats or weight loss in the last 6 months.  Denies any personal history of malignancies.  She worked Marketing executive job and also worked for [employer].  No clear chemical exposure.  No smoking history.  Family history significant for daughter with thyroid cancer.  Patient reportedly had fibrocystic disease of the breast and was treated with androgens when she was in her 27s.  PAST MEDICAL HISTORY:   Past Medical History:  Diagnosis Date  . Anxiety   . Arthritis    back of neck, bones spurs on neck  . Cataract   . Cervical disc disease   . Diabetes mellitus   . Hyperlipidemia   .  Hypertension   . Mucoid cyst of joint    right thumb  . Neuropathy   . Reflux   . Sleep apnea    wears CPAP nightly  . Vertigo     ALLERGIES: Allergies  Allergen Reactions  . Hydrocodone-Homatropine Other (See Comments)    Vertigo *pt strongly prefers to never take*  . Sulfa Antibiotics     Tongue swells, hives, itching  . Augmentin [Amoxicillin-Pot Clavulanate]     Diarrhea; can take PCN/ Amoxicillin  . Codeine     itch  . Crestor [Rosuvastatin Calcium] Other (See Comments)    Did something to memory   . Doxycycline   . Keflex [Cephalexin] Diarrhea and Nausea And Vomiting  . Lipitor [Atorvastatin Calcium] Other (See Comments)    Makes weak   . Naproxen Other (See Comments)    Stomach cramps  . Prednisone Other (See Comments)    Nervous *pt strongly prefers to never be given prednisone*       MEDICATIONS: I have reviewed the patient's current medications.     PAST SURGICAL HISTORY Past Surgical History:  Procedure Laterality Date  . BLADDER SURGERY    . BREAST EXCISIONAL BIOPSY Bilateral   . BREAST SURGERY    . CHOLECYSTECTOMY    . fibroids removed     breast (both breasts)  . MASS EXCISION Right 06/26/2016   Procedure: EXCISION MUCOID TUMOR RIGHT THUMB, IP RIGHT THUMB;  Surgeon: Daryll Brod, MD;  Location: [facility];  Service: Orthopedics;  Laterality: Right;  . PARTIAL HYSTERECTOMY      FAMILY HISTORY: Family History  Problem Relation Age of Onset  . Hypertension Other   . Diabetes Father   . Hypertension Father   . Diabetes Sister   . Hypertension Mother   . Hypertension Sister   . Stroke Sister   . Diabetes Maternal Aunt   . Colon cancer Neg Hx   . Esophageal cancer Neg Hx   . Stomach cancer Neg Hx   . Rectal cancer Neg Hx     SOCIAL HISTORY:  reports that she has never smoked. She has never used smokeless tobacco. She reports that she does not drink alcohol or use drugs.  PERFORMANCE STATUS: The patient's performance status  is 1 - Symptomatic but completely ambulatory  PHYSICAL EXAM: Most Recent Vital Signs: Blood pressure (!) 114/54, pulse 80, temperature 99.1 F (37.3 C), temperature source Oral, resp. rate 18, height 5\' 4"  (1.626 m), weight 198 lb (89.8 kg), SpO2 95 %. BP (!) 114/54 (BP Location: Right Arm)   Pulse 80   Temp 99.1 F (37.3 C) (Oral)   Resp 18   Ht 5\' 4"  (1.626 m)   Wt 198 lb (89.8 kg)   SpO2 95%   BMI 33.99 kg/m  General appearance: alert, cooperative and appears stated age Neck: no adenopathy and thyroid not enlarged, symmetric, no tenderness/mass/nodules Lungs: clear to auscultation bilaterally Heart: regular rate and rhythm Abdomen: Soft, mild tenderness in the right upper quadrant.  Subcutaneous lipomas present.  No palpable splenomegaly.  No palpable hepatomegaly. Extremities: extremities normal, atraumatic, no cyanosis or edema Skin: Skin color, texture, turgor normal. No rashes or lesions Lymph nodes: Cervical, supraclavicular, and axillary nodes normal. Neurologic: Grossly normal  LABORATORY DATA:  Results for orders placed or performed during the hospital encounter of 04/03/19 (from the past 48 hour(s))  CBC with Differential     Status: Abnormal   Collection Time: 04/03/19  6:20 PM  Result Value Ref Range   WBC 11.6 (H) 4.0 - 10.5 K/uL   RBC 4.31 3.87 - 5.11 MIL/uL   Hemoglobin 12.7 12.0 - 15.0 g/dL   HCT 37.9 36.0 - 46.0 %   MCV 87.9 80.0 - 100.0 fL   MCH 29.5 26.0 - 34.0 pg   MCHC 33.5 30.0 - 36.0 g/dL   RDW 13.1 11.5 - 15.5 %   Platelets 243 150 - 400 K/uL   nRBC 0.0 0.0 - 0.2 %   Neutrophils Relative % 77 %   Neutro Abs 8.8 (H) 1.7 - 7.7 K/uL   Lymphocytes Relative 17 %   Lymphs Abs 2.0 0.7 - 4.0 K/uL   Monocytes Relative 6 %   Monocytes Absolute 0.7 0.1 - 1.0 K/uL   Eosinophils Relative 0 %   Eosinophils Absolute 0.1 0.0 - 0.5 K/uL   Basophils Relative 0 %   Basophils Absolute 0.0 0.0 - 0.1 K/uL   Immature Granulocytes 0 %   Abs Immature Granulocytes  0.05 0.00 - 0.07 K/uL    Comment: Performed at [facility], [address], [place], [place] [address]  Comprehensive metabolic panel     Status: Abnormal   Collection Time: 04/03/19  6:20 PM  Result Value Ref Range   Sodium 136 135 - 145 mmol/L   Potassium 3.6 3.5 - 5.1 mmol/L   Chloride 102 98 - 111 mmol/L   CO2 24 22 - 32 mmol/L   Glucose, Bld 180 (H) 70 - 99 mg/dL  Comment: Glucose reference range applies only to samples taken after fasting for at least 8 hours.   BUN 14 8 - 23 mg/dL   Creatinine, Ser 0.82 0.44 - 1.00 mg/dL   Calcium 9.7 8.9 - 10.3 mg/dL   Total Protein 7.8 6.5 - 8.1 g/dL   Albumin 4.4 3.5 - 5.0 g/dL   AST 18 15 - 41 U/L   ALT 14 0 - 44 U/L   Alkaline Phosphatase 75 38 - 126 U/L   Total Bilirubin 0.5 0.3 - 1.2 mg/dL   GFR calc non Af Amer >60 >60 mL/min   GFR calc Af Amer >60 >60 mL/min   Anion gap 10 5 - 15    Comment: Performed at [facility], [address], [place], [place] [address]  Urinalysis, Routine w reflex microscopic     Status: Abnormal   Collection Time: 04/03/19  7:55 PM  Result Value Ref Range   Color, Urine STRAW (A) YELLOW   APPearance CLEAR CLEAR   Specific Gravity, Urine 1.006 1.005 - 1.030   pH 6.0 5.0 - 8.0   Glucose, UA NEGATIVE NEGATIVE mg/dL   Hgb urine dipstick NEGATIVE NEGATIVE   Bilirubin Urine NEGATIVE NEGATIVE   Ketones, ur NEGATIVE NEGATIVE mg/dL   Protein, ur NEGATIVE NEGATIVE mg/dL   Nitrite NEGATIVE NEGATIVE   Leukocytes,Ua NEGATIVE NEGATIVE    Comment: Performed at [facility], [address] [place], [place] [address]  SARS CORONAVIRUS 2 (TAT 6-24 HRS) Nasopharyngeal Nasopharyngeal Swab     Status: None   Collection Time: 04/03/19  8:13 PM   Specimen: Nasopharyngeal Swab  Result Value Ref Range   SARS Coronavirus 2 NEGATIVE NEGATIVE    Comment: (NOTE) SARS-CoV-2 target nucleic acids are NOT DETECTED. The SARS-CoV-2 RNA is generally detectable in upper and lower respiratory specimens during the acute phase of  infection. Negative results do not preclude SARS-CoV-2 infection, do not rule out co-infections with other pathogens, and should not be used as the sole basis for treatment or other patient management decisions. Negative results must be combined with clinical observations, patient history, and epidemiological information. The expected result is Negative. Fact Sheet for Patients: SugarRoll.be Fact Sheet for Healthcare Providers: https://www.woods-mathews.com/ This test is not yet approved or cleared by the [place] FDA and  has been authorized for detection and/or diagnosis of SARS-CoV-2 by FDA under an Emergency Use Authorization (EUA). This EUA will remain  in effect (meaning this test can be used) for the duration of the COVID-19 declaration under Section 56 4(b)(1) of the Act, 21 U.S.C. section 360bbb-3(b)(1), unless the authorization is terminated or revoked sooner. Performed at [facility], [place] [address] [place], [place] [address]   Glucose, capillary     Status: Abnormal   Collection Time: 04/03/19  9:54 PM  Result Value Ref Range   Glucose-Capillary 174 (H) 70 - 99 mg/dL    Comment: Glucose reference range applies only to samples taken after fasting for at least 8 hours.  CBC WITH DIFFERENTIAL     Status: Abnormal   Collection Time: 04/04/19  5:01 AM  Result Value Ref Range   WBC 11.8 (H) 4.0 - 10.5 K/uL   RBC 4.17 3.87 - 5.11 MIL/uL   Hemoglobin 12.0 12.0 - 15.0 g/dL   HCT 37.6 36.0 - 46.0 %   MCV 90.2 80.0 - 100.0 fL   MCH 28.8 26.0 - 34.0 pg   MCHC 31.9 30.0 - 36.0 g/dL   RDW 12.9 11.5 - 15.5 %  Platelets 234 150 - 400 K/uL   nRBC 0.0 0.0 - 0.2 %   Neutrophils Relative % 67 %   Neutro Abs 7.9 (H) 1.7 - 7.7 K/uL   Lymphocytes Relative 24 %   Lymphs Abs 2.8 0.7 - 4.0 K/uL   Monocytes Relative 8 %   Monocytes Absolute 0.9 0.1 - 1.0 K/uL   Eosinophils Relative 1 %   Eosinophils Absolute 0.1 0.0 - 0.5 K/uL    Basophils Relative 0 %   Basophils Absolute 0.0 0.0 - 0.1 K/uL   Immature Granulocytes 0 %   Abs Immature Granulocytes 0.04 0.00 - 0.07 K/uL    Comment: Performed at [facility], [address] [place], [place] [address]  Hemoglobin A1c     Status: Abnormal   Collection Time: 04/04/19  5:01 AM  Result Value Ref Range   Hgb A1c MFr Bld 7.3 (H) 4.8 - 5.6 %    Comment: (NOTE) Pre diabetes:          5.7%-6.4% Diabetes:              >6.4% Glycemic control for   <7.0% adults with diabetes    Mean Plasma Glucose 162.81 mg/dL    Comment: Performed at [facility] [address] [place],  [address]  Glucose, capillary     Status: Abnormal   Collection Time: 04/04/19  7:31 AM  Result Value Ref Range   Glucose-Capillary 130 (H) 70 - 99 mg/dL    Comment: Glucose reference range applies only to samples taken after fasting for at least 8 hours.  Glucose, capillary     Status: Abnormal   Collection Time: 04/04/19 11:04 AM  Result Value Ref Range   Glucose-Capillary 190 (H) 70 - 99 mg/dL    Comment: Glucose reference range applies only to samples taken after fasting for at least 8 hours.  Glucose, capillary     Status: Abnormal   Collection Time: 04/04/19  3:58 PM  Result Value Ref Range   Glucose-Capillary 140 (H) 70 - 99 mg/dL    Comment: Glucose reference range applies only to samples taken after fasting for at least 8 hours.      RADIOGRAPHY: CT ABDOMEN PELVIS W CONTRAST  Result Date: 04/02/2019 CLINICAL DATA:  Right flank and abdominal pain, began yesterday, constipation acute though fall EXAM: CT ABDOMEN AND PELVIS WITH CONTRAST TECHNIQUE: Multidetector CT imaging of the abdomen and pelvis was performed using the standard protocol following bolus administration of intravenous contrast. CONTRAST:  179mL OMNIPAQUE IOHEXOL 300 MG/ML  SOLN COMPARISON:  None. FINDINGS: Lower chest: Lung bases are clear. Normal heart size. No pericardial effusion. Hepatobiliary: No focal liver  abnormality is seen. Patient is post cholecystectomy. Slight prominence of the biliary tree likely related to reservoir effect. No calcified intraductal gallstones. Pancreas: Unremarkable. No pancreatic ductal dilatation or surrounding inflammatory changes. Spleen: Normal in size without focal abnormality. Adrenals/Urinary Tract: Normal adrenal glands. No visible or contour deforming renal lesions. There is mild moderate to severe right hydroureteronephrosis with a an suspected ureteral obstruction at the level the soft tissue lesions in the retroperitoneum further detailed below. There is associated moderate right perinephric stranding. More mild left hydronephrosis is seen to the level of the distended urinary bladder. The left ureter courses in close proximity to several retroperitoneal soft tissue and cystic lesions but does not appear frankly occluded at these levels. No obstructive urolithiasis is evident. The bladder itself is markedly distended with chest a pronounced cystocele. Stomach/Bowel: Small  hiatal hernia. Distal stomach is unremarkable. The second portion of the duodenum courses in close proximity to the soft tissue attenuation lesions in the retroperitoneum including loss of discernible fat plane at the level of the distal second portion (7/30). No small bowel dilatation or wall thickening though several additional loops of small bowel do also course in close proximity to the retroperitoneal lesions (2/54, 5/56). No evidence of small-bowel obstruction. No colonic dilatation or wall thickening. A normal appendix is visualized. Focal nodular enhancement at the 10 o'clock position at the level of the rectum may reflect an internal hemorrhoid. Vascular/Lymphatic: There are mixed solid and cystic lesions within the retroperitoneum and centered predominantly in the aortocaval interval and tracking into the right iliac chain. Largest solid component appears to measure 5.9 x 4.3 x 6.4 cm in size though  accurate determination is difficult given the infiltrative appearance. A more intermediate attenuation possibly cystic component seen to the left of the aorta measures approximately 4 x 3.6 x 4.6 cm in size. The soft tissue lesions partially encase the abdominal aorta as well as the IMA origin, proximal right renal artery, and right common iliac. Furthermore, there is compression of the IVC with loss of clearly discernible fat plane and at least a small segment concerning for possible intraluminal extension (6/59). Reproductive: Uterus is surgically absent. No concerning adnexal lesions. Other: Retroperitoneal mixed solid and cystic appearing lesions, as above. Musculoskeletal: Multilevel degenerative changes are present in the imaged portions of the spine. No acute osseous abnormality or suspicious osseous lesion. No lytic or sclerotic bony lesions are seen. IMPRESSION: 1. Mixed solid and cystic appearing retroperitoneal lesions which may reflect conglomerate nodal mass lesions or metastases some of which may be necrotic. Alternative differential could include retroperitoneal fibrosis which is less favored given the overall masslike appearance and encasement rather than occlusion of the vessels as detailed above. Recommend correlation with patient exam findings and history. 2. Severe right hydroureteronephrosis to the level of the mid ureter where there is likely occlusion by the retroperitoneal lesions above. 3. Moderate left hydronephrosis appears secondary to a distended urinary bladder with large cystocele. No obstructive urolithiasis. 4. Focal nodular enhancement at the 10 o'clock position at the level of the rectum with rectocele may reflect an internal hemorrhoid. Could correlate with exam findings. These results were called by telephone at the time of interpretation on 04/02/2019 at 10:24 pm to provider JULIE IDOL , who verbally acknowledged these results. Electronically Signed   By: Lovena Le M.D.   On:  04/02/2019 22:25        ASSESSMENT and PLAN:  1.  Retroperitoneal masses: -CTAP on 04/02/2019 showed 5.9 x 4.3 x 6.4 cm in the right retroperitoneum and centered predominantly in the aortocaval interval and tracking into the right iliac chain.  More intermediate cystic component seen in the left of the aorta measures 4 x 3.6 x 4.6 cm.  Soft tissue lesions partially encased the abdominal aorta as well as the IMA origin, proximal right renal artery and right common iliac.  There is compression of the IVC and loss of clearly discernible fat plane at least a small segment concerning for possible intraluminal extension. -She reports dull achy pain in the right lung region.  Urinalysis was negative for blood or infection.  White count was mildly elevated, predominantly neutrophils.  Creatinine was normal. -The retroperitoneal masses are in a difficult area for biopsy. -Agree with CT of the chest to see if any easily biopseable lesions will be identified. -  Would recommend checking LDH. -If there is no easily identifiable lesions in the CT chest, would recommend outpatient PET CT scan and follow-up with me in the office.  2.  Intractable pain: -She is currently receiving morphine 4 mg IV every 3 hours as needed. -She reports pain is dull toothache-like pain in the right lung region coming into the right groin.  Not associated with any nausea. -She could not tolerate Percocet in the past which caused hallucinations.  She could not take hydrocodone because of vertigo. -She can be given a trial of oral morphine sulfate immediate release and can be discharged home on it.  3.  Hypertension: -She is on amlodipine 5 mg daily.  4.  Diabetes: -Oral hypoglycemics are on hold.  She is on sliding scale insulin.  All questions were answered. The patient knows to call the clinic with any problems, questions or concerns. We can certainly see the patient much sooner if necessary.    Derek Jack

## 2019-04-04 NOTE — Progress Notes (Signed)
PROGRESS NOTE    Laura [name]  [id number] DOB: 05-Jun-1945 DOA: 04/03/2019 PCP: Biagio Borg, MD     Brief Narrative:  As per H&P written by Dr. Olevia Bowens on 04/03/2019 74 y.o. female with medical history significant of anxiety, osteoarthritis of the neck, cataracts, cervical disc disease, type 2 diabetes, hyperlipidemia, hypertension, mucoid cyst of the right thumb, neuropathy, reflux, sleep apnea not using CPAP regularly, vertigo who is coming again to the emergency department due to right lower abdominal pain after being discharge earlier in the morning from the ED due to right flank pain secondary to retroperitoneal masses causing hydronephrosis of the kidneys without renal function impairment.  The patient states that she went home around 2 in the morning without much pain and was able to sleep until 8:30 in the morning.  She went to church and was doing well until she tried to eat lunch in the afternoon.  After that, the patient has had a recurrence of her right flank/right lower quadrant pain, which is more intense than it was last night.  She denies fever, chills, nausea or vomiting, diarrhea, constipation, melena or hematochezia.  No dysuria, frequency or hematuria.  She denies chest pain, palpitations, dizziness, diaphoresis, PND, orthopnea or pitting edema of the lower extremities.  No sore throat, dyspnea, wheezing or hemoptysis.  No polyuria, polydipsia, polyphagia or blurred vision.  ED Course: Initial vital signs were temperature 99 F, pulse 87, respiration 18, blood pressure 177/88 mmHg and O2 sat 100% on room air.  The patient received 4 mg of morphine and 4 mg of Zofran in the ED.  She is currently having very minimal pain.  Her urinalysis was unremarkable.  CBC showed a white count of 11.6 with 77% neutrophils and 17% lymphocytes, hemoglobin 12.7 g/dL and platelets 343.  CMP was normal, except for a glucose of 180 mg/dL.  CT shows mid solid and cystic appearing retroperitoneal  lesions which may reflect conglomerate nodal mass lesions or metastasis some of which may be necrotic.  There is severe right and moderate left hydroureteronephrosis.  Please see images and full radiology report for further detail.  Assessment & Plan: 1-Retroperitoneal mass/abdominal and flank pain -Unsure of final diagnosis at this moment -Discussed with interventional radiologist who felt no safe approach due to large vessels and secondary to ureter to perform CT-guided biopsy. -CT chest has been ordered looking for any other lesion is here for biopsy/tissue sampling. -Continue IV analgesics. -Advance diet.  2-Type 2 diabetes mellitus ([place]) -Continue holding oral hypoglycemic agents -Sliding scale insulin has been ordered -Follow CBG fluctuation. -Follow A1c level.  3-class I obesity -Body mass index is 33.99 kg/m. -Low calorie diet, portion control and increase physical activity discussed with patient.  4-Hyperlipidemia -Currently not receiving statins -Continue heart healthy diet and close monitoring as an outpatient with initiation on hypolipemic regimen as required.  5-Depression with anxiety -Stable mood overall -Continue as needed lorazepam. -No suicidal ideation or hallucinations currently.  6-OSA (obstructive sleep apnea) -Patient reports the use of CPAP at bedtime intermittently as an outpatient -expressed for now, will prefer not to use a CPAP while in the hospital. -Continue to monitor.  7-GERD (gastroesophageal reflux disease) -Continue PPI.  8-Hypertension -Continue antihypertensive regimen (amlodipine 5 mg daily) -Heart healthy diet. -Follow vital signs.   DVT prophylaxis: SCDs Code Status: Full code Family Communication: Daughter at bedside. Disposition Plan: Remains inpatient, continue as needed analgesics and follow oncology service recommendations.  Based on IR assessment CT chest has been  ordered to try to find different lesion easier to  biopsy.  Consultants:   Oncology  IR  Procedures:   See below for x-ray reports.  Antimicrobials:  Anti-infectives (From admission, onward)   None      Subjective: Afebrile, no chest pain, no nausea, no vomiting.  Expresses still ongoing pain in the right flank/right upper quadrant area intermittently.  Requiring IV pain medications to control it.  Objective: Vitals:   04/04/19 0630 04/04/19 0804 04/04/19 1000 04/04/19 1400  BP: (!) 128/52  (!) 118/49 (!) 114/54  Pulse: 77  86 80  Resp: 18  18 18   Temp: 99.4 F (37.4 C)   99.1 F (37.3 C)  TempSrc: Oral   Oral  SpO2: 94% 98% 92% 95%  Weight:      Height:        Intake/Output Summary (Last 24 hours) at 04/04/2019 1616 Last data filed at 04/04/2019 1300 Gross per 24 hour  Intake 480 ml  Output 1290 ml  Net -810 ml   Filed Weights   04/03/19 1759 04/03/19 2151  Weight: 88.9 kg 89.8 kg    Examination: General exam: Alert, awake, oriented x 3; no nausea or vomiting.  Reports still ongoing right upper quadrant pain and right flank pain.  No fever, no dysuria or hematuria. Respiratory system: Clear to auscultation. Respiratory effort normal.  Good oxygen saturation on room air. Cardiovascular system:RRR. No murmurs, rubs, gallops.  No JVD Gastrointestinal system: Abdomen is nondistended, soft and tender to palpation in the right upper quadrant/right flank area.  No guarding appreciated. Central nervous system: Alert and oriented. No focal neurological deficits. Extremities: No cyanosis or clubbing. Skin: No rashes, petechiae. Psychiatry: Judgement and insight appear normal. Mood & affect appropriate.     Data Reviewed: I have personally reviewed following labs and imaging studies  CBC: Recent Labs  Lab 04/02/19 2100 04/03/19 1820 04/04/19 0501  WBC 13.2* 11.6* 11.8*  NEUTROABS 10.5* 8.8* 7.9*  HGB 13.6 12.7 12.0  HCT 40.5 37.9 37.6  MCV 88.4 87.9 90.2  PLT 245 243 [id number]   Basic Metabolic Panel: Recent  Labs  Lab 04/02/19 2100 04/03/19 1820  NA 136 136  K 3.9 3.6  CL 101 102  CO2 23 24  GLUCOSE 186* 180*  BUN 13 14  CREATININE 0.69 0.82  CALCIUM 9.8 9.7   GFR: Estimated Creatinine Clearance: 65.3 mL/min (by C-G formula based on SCr of 0.82 mg/dL).   Liver Function Tests: Recent Labs  Lab 04/02/19 2100 04/03/19 1820  AST 16 18  ALT 15 14  ALKPHOS 82 75  BILITOT 0.5 0.5  PROT 8.1 7.8  ALBUMIN 4.8 4.4   Recent Labs  Lab 04/02/19 2100  LIPASE 14   HbA1C: Recent Labs    04/04/19 0501  HGBA1C 7.3*   CBG: Recent Labs  Lab 04/03/19 2154 04/04/19 0731 04/04/19 1104 04/04/19 1558  GLUCAP 174* 130* 190* 140*   Urine analysis:    Component Value Date/Time   COLORURINE STRAW (A) 04/03/2019 1955   APPEARANCEUR CLEAR 04/03/2019 1955   LABSPEC 1.006 04/03/2019 1955   PHURINE 6.0 04/03/2019 1955   GLUCOSEU NEGATIVE 04/03/2019 1955   GLUCOSEU NEGATIVE 03/09/2018 [place] NEGATIVE 04/03/2019 1955   HGBUR negative 05/15/2009 [place] 04/03/2019 1955   BILIRUBINUR negative 12/02/2017 1142   [place] 04/03/2019 1955   PROTEINUR NEGATIVE 04/03/2019 1955   UROBILINOGEN 0.2 03/09/2018 1554   NITRITE NEGATIVE 04/03/2019 1955   LEUKOCYTESUR NEGATIVE  04/03/2019 1955    Recent Results (from the past 240 hour(s))  SARS CORONAVIRUS 2 (TAT 6-24 HRS) Nasopharyngeal Nasopharyngeal Swab     Status: None   Collection Time: 04/03/19  8:13 PM   Specimen: Nasopharyngeal Swab  Result Value Ref Range Status   SARS Coronavirus 2 NEGATIVE NEGATIVE Final    Comment: (NOTE) SARS-CoV-2 target nucleic acids are NOT DETECTED. The SARS-CoV-2 RNA is generally detectable in upper and lower respiratory specimens during the acute phase of infection. Negative results do not preclude SARS-CoV-2 infection, do not rule out co-infections with other pathogens, and should not be used as the sole basis for treatment or other patient management decisions. Negative  results must be combined with clinical observations, patient history, and epidemiological information. The expected result is Negative. Fact Sheet for Patients: SugarRoll.be Fact Sheet for Healthcare Providers: https://www.woods-mathews.com/ This test is not yet approved or cleared by the [place] FDA and  has been authorized for detection and/or diagnosis of SARS-CoV-2 by FDA under an Emergency Use Authorization (EUA). This EUA will remain  in effect (meaning this test can be used) for the duration of the COVID-19 declaration under Section 56 4(b)(1) of the Act, 21 U.S.C. section 360bbb-3(b)(1), unless the authorization is terminated or revoked sooner. Performed at [facility], [place] [address], [place], [place] [address]     Radiology Studies: CT ABDOMEN PELVIS W CONTRAST  Result Date: 04/02/2019 CLINICAL DATA:  Right flank and abdominal pain, began yesterday, constipation acute though fall EXAM: CT ABDOMEN AND PELVIS WITH CONTRAST TECHNIQUE: Multidetector CT imaging of the abdomen and pelvis was performed using the standard protocol following bolus administration of intravenous contrast. CONTRAST:  136mL OMNIPAQUE IOHEXOL 300 MG/ML  SOLN COMPARISON:  None. FINDINGS: Lower chest: Lung bases are clear. Normal heart size. No pericardial effusion. Hepatobiliary: No focal liver abnormality is seen. Patient is post cholecystectomy. Slight prominence of the biliary tree likely related to reservoir effect. No calcified intraductal gallstones. Pancreas: Unremarkable. No pancreatic ductal dilatation or surrounding inflammatory changes. Spleen: Normal in size without focal abnormality. Adrenals/Urinary Tract: Normal adrenal glands. No visible or contour deforming renal lesions. There is mild moderate to severe right hydroureteronephrosis with a an suspected ureteral obstruction at the level the soft tissue lesions in the retroperitoneum further detailed  below. There is associated moderate right perinephric stranding. More mild left hydronephrosis is seen to the level of the distended urinary bladder. The left ureter courses in close proximity to several retroperitoneal soft tissue and cystic lesions but does not appear frankly occluded at these levels. No obstructive urolithiasis is evident. The bladder itself is markedly distended with chest a pronounced cystocele. Stomach/Bowel: Small hiatal hernia. Distal stomach is unremarkable. The second portion of the duodenum courses in close proximity to the soft tissue attenuation lesions in the retroperitoneum including loss of discernible fat plane at the level of the distal second portion (7/30). No small bowel dilatation or wall thickening though several additional loops of small bowel do also course in close proximity to the retroperitoneal lesions (2/54, 5/56). No evidence of small-bowel obstruction. No colonic dilatation or wall thickening. A normal appendix is visualized. Focal nodular enhancement at the 10 o'clock position at the level of the rectum may reflect an internal hemorrhoid. Vascular/Lymphatic: There are mixed solid and cystic lesions within the retroperitoneum and centered predominantly in the aortocaval interval and tracking into the right iliac chain. Largest solid component appears to measure 5.9 x 4.3 x 6.4 cm in size though accurate  determination is difficult given the infiltrative appearance. A more intermediate attenuation possibly cystic component seen to the left of the aorta measures approximately 4 x 3.6 x 4.6 cm in size. The soft tissue lesions partially encase the abdominal aorta as well as the IMA origin, proximal right renal artery, and right common iliac. Furthermore, there is compression of the IVC with loss of clearly discernible fat plane and at least a small segment concerning for possible intraluminal extension (6/59). Reproductive: Uterus is surgically absent. No concerning  adnexal lesions. Other: Retroperitoneal mixed solid and cystic appearing lesions, as above. Musculoskeletal: Multilevel degenerative changes are present in the imaged portions of the spine. No acute osseous abnormality or suspicious osseous lesion. No lytic or sclerotic bony lesions are seen. IMPRESSION: 1. Mixed solid and cystic appearing retroperitoneal lesions which may reflect conglomerate nodal mass lesions or metastases some of which may be necrotic. Alternative differential could include retroperitoneal fibrosis which is less favored given the overall masslike appearance and encasement rather than occlusion of the vessels as detailed above. Recommend correlation with patient exam findings and history. 2. Severe right hydroureteronephrosis to the level of the mid ureter where there is likely occlusion by the retroperitoneal lesions above. 3. Moderate left hydronephrosis appears secondary to a distended urinary bladder with large cystocele. No obstructive urolithiasis. 4. Focal nodular enhancement at the 10 o'clock position at the level of the rectum with rectocele may reflect an internal hemorrhoid. Could correlate with exam findings. These results were called by telephone at the time of interpretation on 04/02/2019 at 10:24 pm to provider JULIE IDOL , who verbally acknowledged these results. Electronically Signed   By: Lovena Le M.D.   On: 04/02/2019 22:25    Scheduled Meds: . amLODipine  5 mg Oral Daily  . darifenacin  7.5 mg Oral Daily  . insulin aspart  0-9 Units Subcutaneous TID WC  . pantoprazole  40 mg Oral Daily   Continuous Infusions:   LOS: 0 days    Time spent: 30 minutes.    Barton Dubois, MD [facility] Pager [phone]   04/04/2019, 4:16 PM

## 2019-04-04 NOTE — Progress Notes (Signed)
Interventional Radiology Brief Note:  Patient returned to the ED overnight with abdominal pain thought to originate from an RP mass influencing the right ureter causing hydronephrosis. IR consulted for biopsy of the mass.   Imaging reviewed by Dr. Kathlene Cote who notes there is no safe window to sample the mass due to its position in relation to the major vessels and right ureter. He recommends CT Chest to assess for additional targets.  Ultimately, patient may benefit from an outpatient PET to determine best approach.   Brynda Greathouse, MS RD PA-C 11:56 AM

## 2019-04-05 DIAGNOSIS — K219 Gastro-esophageal reflux disease without esophagitis: Secondary | ICD-10-CM

## 2019-04-05 DIAGNOSIS — Z6833 Body mass index (BMI) 33.0-33.9, adult: Secondary | ICD-10-CM

## 2019-04-05 DIAGNOSIS — E119 Type 2 diabetes mellitus without complications: Secondary | ICD-10-CM

## 2019-04-05 DIAGNOSIS — F418 Other specified anxiety disorders: Secondary | ICD-10-CM

## 2019-04-05 DIAGNOSIS — E6609 Other obesity due to excess calories: Secondary | ICD-10-CM

## 2019-04-05 LAB — CBC WITH DIFFERENTIAL/PLATELET
Abs Immature Granulocytes: 0.03 10*3/uL (ref 0.00–0.07)
Basophils Absolute: 0 10*3/uL (ref 0.0–0.1)
Basophils Relative: 0 %
Eosinophils Absolute: 0.1 10*3/uL (ref 0.0–0.5)
Eosinophils Relative: 1 %
HCT: 37.2 % (ref 36.0–46.0)
Hemoglobin: 12 g/dL (ref 12.0–15.0)
Immature Granulocytes: 0 %
Lymphocytes Relative: 22 %
Lymphs Abs: 2 10*3/uL (ref 0.7–4.0)
MCH: 28.9 pg (ref 26.0–34.0)
MCHC: 32.3 g/dL (ref 30.0–36.0)
MCV: 89.6 fL (ref 80.0–100.0)
Monocytes Absolute: 0.6 10*3/uL (ref 0.1–1.0)
Monocytes Relative: 7 %
Neutro Abs: 6.1 10*3/uL (ref 1.7–7.7)
Neutrophils Relative %: 70 %
Platelets: 248 10*3/uL (ref 150–400)
RBC: 4.15 MIL/uL (ref 3.87–5.11)
RDW: 12.9 % (ref 11.5–15.5)
WBC: 8.8 10*3/uL (ref 4.0–10.5)
nRBC: 0 % (ref 0.0–0.2)

## 2019-04-05 LAB — GLUCOSE, CAPILLARY
Glucose-Capillary: 156 mg/dL — ABNORMAL HIGH (ref 70–99)
Glucose-Capillary: 162 mg/dL — ABNORMAL HIGH (ref 70–99)
Glucose-Capillary: 127 mg/dL — ABNORMAL HIGH (ref 70–99)

## 2019-04-05 LAB — LACTATE DEHYDROGENASE: LDH: 148 U/L (ref 98–192)

## 2019-04-05 MED ORDER — MORPHINE SULFATE (PF) 4 MG/ML IV SOLN: 4.0000 mg | INTRAVENOUS | Status: DC | PRN

## 2019-04-05 MED ORDER — ACETAMINOPHEN 650 MG RE SUPP: 650.0000 mg | Freq: Four times a day (QID) | RECTAL | Status: DC | PRN

## 2019-04-05 MED ORDER — MORPHINE SULFATE 15 MG PO TABS
15.0000 mg | ORAL_TABLET | Freq: Four times a day (QID) | ORAL | Status: DC | PRN
  Administered 2019-04-05 (×2): 15 mg via ORAL
  Filled 2019-04-05 (×2): qty 1

## 2019-04-05 MED ORDER — ACETAMINOPHEN 325 MG PO TABS: 650.0000 mg | ORAL_TABLET | Freq: Four times a day (QID) | ORAL | Status: DC | PRN

## 2019-04-05 MED ORDER — MORPHINE SULFATE 15 MG PO TABS: 15.0000 mg | ORAL_TABLET | Freq: Four times a day (QID) | ORAL | 0 refills | Status: AC | PRN

## 2019-04-05 NOTE — Progress Notes (Signed)
Pt discharged via wheelchair to POV. States pain in right flank, "getting better", rated 4/10, down from 6/10. Pt has all belongings in her possession.

## 2019-04-05 NOTE — Discharge Summary (Signed)
Physician Discharge Summary  Laura [name] IEP:[id number] DOB: 13-Dec-1945 DOA: 04/03/2019  PCP: Biagio Borg, MD  Admit date: 04/03/2019 Discharge date: 04/05/2019  Time spent: 35 minutes  Recommendations for Outpatient Follow-up:  1. Repeat basic metabolic panel to follow electrolytes and renal function 2. Reassess blood pressure and adjust antihypertensive treatment as needed 3. Patient to follow-up with oncology service for PET scan and further evaluation/management of retroperitoneal mass found during hospitalization.   Discharge Diagnoses:  Principal Problem:   Retroperitoneal mass Active Problems:   Type 2 diabetes mellitus (HCC)   Hyperlipidemia   Depression with anxiety   OSA (obstructive sleep apnea)   GERD (gastroesophageal reflux disease)   Class 1 obesity due to excess calories with body mass index (BMI) of 33.0 to 33.9 in adult   Hypertension   Abdominal pain   Discharge Condition: Stable and improved.  Discharged home with instruction to follow-up with PCP and oncology service for further evaluation and management.  She will need PET scan as an outpatient.  CODE STATUS: Full code  Diet recommendation: Heart healthy/modified carbohydrate diet.  Filed Weights   04/03/19 1759 04/03/19 2151  Weight: 88.9 kg 89.8 kg    History of present illness:  As per H&P written by Dr. Olevia Bowens on 04/03/2019 74 y.o.femalewith medical history significant ofanxiety, osteoarthritis of the neck, cataracts, cervical disc disease, type 2 diabetes, hyperlipidemia, hypertension, mucoid cyst of the right thumb, neuropathy, reflux, sleep apnea not using CPAP regularly, vertigo who is coming again to the emergency department due to right lower abdominal pain after being discharge earlier in the morning from the ED due to right flank pain secondary to retroperitoneal masses causing hydronephrosis of the kidneys without renal function impairment. The patient states that she went home around 2  in the morning without much pain and was able to sleep until 8:30 in the morning. She went to church and was doing well until she tried to eat lunch in the afternoon. After that, the patient has had a recurrence of her right flank/right lower quadrant pain, which is more intense than it was last night. She denies fever, chills, nausea or vomiting, diarrhea, constipation, melena or hematochezia. No dysuria, frequency or hematuria. She denies chest pain, palpitations, dizziness, diaphoresis, PND, orthopnea or pitting edema of the lower extremities. No sore throat, dyspnea, wheezing or hemoptysis. No polyuria, polydipsia, polyphagia or blurred vision.  ED Course:Initial vital signs were temperature 99 F, pulse 87, respiration 18, blood pressure 177/88 mmHg and O2 sat 100% on room air. The patient received 4 mg of morphine and 4 mg of Zofran in the ED. She is currently having very minimal pain.  Her urinalysis was unremarkable. CBC showed a white count of 11.6 with 77% neutrophils and 17% lymphocytes, hemoglobin 12.7 g/dL and platelets 343. CMP was normal, except for a glucose of 180 mg/dL. CT shows mid solid and cystic appearing retroperitoneal lesions which may reflect conglomerate nodal mass lesions or metastasis some of which may be necrotic. There is severe right and moderate left hydroureteronephrosis. Please see images and full radiology report for further detail.  Hospital Course:  1-Retroperitoneal mass/abdominal and flank pain -Unsure of final diagnosis at this moment -Discussed with interventional radiologist who felt no safe approach due to large vessels and secondary to ureter to perform CT-guided biopsy. -CT chest demonstrated no abnormalities, no nodules or sizable areas for sampling. -Patient will follow up with oncology service outpatient for PET scan and will continue as needed morphine as she  demonstrated good oral tolerance for pain management.  2-Type 2 diabetes  mellitus ([place]) -Continue holding oral hypoglycemic agents -Sliding scale insulin has been ordered -Follow CBG fluctuation. -A1C 7.3  3-class I obesity -Body mass index is 33.99 kg/m. -Low calorie diet, portion control and increase physical activity discussed with patient.  4-Hyperlipidemia -Currently not receiving statins -Continue heart healthy diet and close monitoring as an outpatient with initiation on hypolipemic regimen as needed.  5-Depression with anxiety -Stable mood overall -Continue as needed lorazepam. -No suicidal ideation or hallucinations currently.  6-OSA (obstructive sleep apnea) -Patient reports the use of CPAP at bedtime intermittently as an outpatient -Encouraged for better compliance. -no symptoms of hypercapnia or over somnolence appreciated on examination.  7-GERD (gastroesophageal reflux disease) -Continue PPI.  8-Hypertension -Continue home antihypertensive regimen (amlodipine 5 mg daily) -Heart healthy diet has been encouraged. -Reassess blood pressure at follow-up visit and adjust medication as required.  Procedures:  See below for x-ray reports.  Consultations:  Oncology service  IR  Discharge Exam: Vitals:   04/05/19 1341 04/05/19 1436  BP: (!) 111/47   Pulse: 82   Resp: 19   Temp: 98.7 F (37.1 C)   SpO2: 97% 98%   General exam: Alert, awake, oriented x 3; no nausea or vomiting.  Reports still ongoing right upper quadrant pain and right flank pain; but overall much improved (intensity 2 out of 10 and with good response to oral analgesic regimen). No fever, no dysuria or hematuria. Respiratory system: Clear to auscultation. Respiratory effort normal.  Good oxygen saturation on room air. Cardiovascular system:RRR. No murmurs, rubs, gallops.  No JVD Gastrointestinal system: Abdomen is nondistended, soft and just mildly tender to deep palpation in the right upper quadrant/right flank area.  No guarding appreciated. Central  nervous system: Alert and oriented. No focal neurological deficits. Extremities: No cyanosis or clubbing. Skin: No rashes, petechiae. Psychiatry: Judgement and insight appear normal. Mood & affect appropriate.    Discharge Instructions   Discharge Instructions    Diet Carb Modified   Complete by: As directed    Discharge instructions   Complete by: As directed    Take medications as prescribed Maintain adequate hydration Follow-up with Dr. Delton Coombes as an outpatient (contact office for appointment details). Arrange follow-up with PCP in 10 days.     Allergies as of 04/05/2019      Reactions   Hydrocodone-homatropine Other (See Comments)   Vertigo *pt strongly prefers to never take*   Sulfa Antibiotics    Tongue swells, hives, itching   Augmentin [amoxicillin-pot Clavulanate]    Diarrhea; can take PCN/ Amoxicillin   Codeine    itch   Crestor [rosuvastatin Calcium] Other (See Comments)   Did something to memory   Doxycycline    Keflex [cephalexin] Diarrhea, Nausea And Vomiting   Lipitor [atorvastatin Calcium] Other (See Comments)   Makes weak   Naproxen Other (See Comments)   Stomach cramps   Prednisone Other (See Comments)   Nervous *pt strongly prefers to never be given prednisone*      Medication List    STOP taking these medications   Goodys Extra Strength 520-260-32.5 MG Pack Generic drug: Aspirin-Acetaminophen-Caffeine   indomethacin 50 MG capsule Commonly known as: INDOCIN   loperamide 2 MG capsule Commonly known as: IMODIUM   traMADol 50 MG tablet Commonly known as: ULTRAM     TAKE these medications   acetaminophen 325 MG tablet Commonly known as: TYLENOL Take 325 mg by mouth as needed for mild pain,  moderate pain, fever or headache.   amLODipine 5 MG tablet Commonly known as: NORVASC TAKE ONE (1) TABLET BY MOUTH EVERY DAY What changed: See the new instructions.   glimepiride 2 MG tablet Commonly known as: AMARYL TAKE ONETABLET BY MOUTH  DAILY   LORazepam 0.5 MG tablet Commonly known as: ATIVAN TAKE 1 TABLET BY MOUTH 2 TIMES DAILY AS NEEDED FOR ANXIETY What changed: See the new instructions.   morphine 15 MG tablet Commonly known as: MSIR Take 1 tablet (15 mg total) by mouth every 6 (six) hours as needed for up to 7 days for moderate pain or severe pain.   ONE TOUCH ULTRA 2 w/Device Kit Use as directed   OneTouch Ultra test strip Generic drug: glucose blood USE TO CHECK BLOOD SUGARS TWO TIMES DAILY   onetouch ultrasoft lancets USE TO CHECK BLOOD SUGAR TWICE DAILY   pantoprazole 40 MG tablet Commonly known as: PROTONIX TAKE 1 TABLET (40 MG TOTAL) BY MOUTH DAILY.   solifenacin 5 MG tablet Commonly known as: VESICARE Take 5 mg by mouth daily.   VITAMIN B-12 PO Take by mouth.      Allergies  Allergen Reactions  . Hydrocodone-Homatropine Other (See Comments)    Vertigo *pt strongly prefers to never take*  . Sulfa Antibiotics     Tongue swells, hives, itching  . Augmentin [Amoxicillin-Pot Clavulanate]     Diarrhea; can take PCN/ Amoxicillin  . Codeine     itch  . Crestor [Rosuvastatin Calcium] Other (See Comments)    Did something to memory   . Doxycycline   . Keflex [Cephalexin] Diarrhea and Nausea And Vomiting  . Lipitor [Atorvastatin Calcium] Other (See Comments)    Makes weak   . Naproxen Other (See Comments)    Stomach cramps  . Prednisone Other (See Comments)    Nervous *pt strongly prefers to never be given prednisone*    Follow-up Information    Biagio Borg, MD. Schedule an appointment as soon as possible for a visit in 10 day(s).   Specialties: Internal Medicine, Radiology Contact information: [place] [address] [phone]        Derek Jack, MD. Schedule an appointment as soon as possible for a visit in 1 week(s).   Specialty: Hematology Contact information: [address] [place] [address] [phone]           The results of  significant diagnostics from this hospitalization (including imaging, microbiology, ancillary and laboratory) are listed below for reference.    Significant Diagnostic Studies: CT CHEST W CONTRAST  Result Date: 04/04/2019 CLINICAL DATA:  Retroperitoneal adenopathy in the lower abdomen/pelvis. Now for further assessment EXAM: CT CHEST WITH CONTRAST TECHNIQUE: Multidetector CT imaging of the chest was performed during intravenous contrast administration. CONTRAST:  66m OMNIPAQUE IOHEXOL 300 MG/ML  SOLN COMPARISON:  None. FINDINGS: Cardiovascular: Heart is normal size. Diffusely calcified coronary arteries. Aortic atherosclerosis. No aneurysm. Mediastinum/Nodes: No mediastinal, hilar, or axillary adenopathy. Trachea and esophagus are unremarkable. Thyroid unremarkable. Small hiatal hernia. Lungs/Pleura: Linear atelectasis or scarring in the right lung base. No confluent opacities or effusions. No suspicious pulmonary nodules. Upper Abdomen: Imaging into the upper abdomen shows no acute findings. Musculoskeletal: Calcifications within the left breast appear benign. Recommend correlation with mammography. No acute bony abnormality. IMPRESSION: No additional abnormality seen within the chest to provide tissue diagnosis. No acute cardiopulmonary disease. Coronary artery disease Aortic Atherosclerosis (ICD10-I70.0). Electronically Signed   By: KRolm BaptiseM.D.   On: 04/04/2019  22:11   CT ABDOMEN PELVIS W CONTRAST  Result Date: 04/02/2019 CLINICAL DATA:  Right flank and abdominal pain, began yesterday, constipation acute though fall EXAM: CT ABDOMEN AND PELVIS WITH CONTRAST TECHNIQUE: Multidetector CT imaging of the abdomen and pelvis was performed using the standard protocol following bolus administration of intravenous contrast. CONTRAST:  132m OMNIPAQUE IOHEXOL 300 MG/ML  SOLN COMPARISON:  None. FINDINGS: Lower chest: Lung bases are clear. Normal heart size. No pericardial effusion. Hepatobiliary: No focal  liver abnormality is seen. Patient is post cholecystectomy. Slight prominence of the biliary tree likely related to reservoir effect. No calcified intraductal gallstones. Pancreas: Unremarkable. No pancreatic ductal dilatation or surrounding inflammatory changes. Spleen: Normal in size without focal abnormality. Adrenals/Urinary Tract: Normal adrenal glands. No visible or contour deforming renal lesions. There is mild moderate to severe right hydroureteronephrosis with a an suspected ureteral obstruction at the level the soft tissue lesions in the retroperitoneum further detailed below. There is associated moderate right perinephric stranding. More mild left hydronephrosis is seen to the level of the distended urinary bladder. The left ureter courses in close proximity to several retroperitoneal soft tissue and cystic lesions but does not appear frankly occluded at these levels. No obstructive urolithiasis is evident. The bladder itself is markedly distended with chest a pronounced cystocele. Stomach/Bowel: Small hiatal hernia. Distal stomach is unremarkable. The second portion of the duodenum courses in close proximity to the soft tissue attenuation lesions in the retroperitoneum including loss of discernible fat plane at the level of the distal second portion (7/30). No small bowel dilatation or wall thickening though several additional loops of small bowel do also course in close proximity to the retroperitoneal lesions (2/54, 5/56). No evidence of small-bowel obstruction. No colonic dilatation or wall thickening. A normal appendix is visualized. Focal nodular enhancement at the 10 o'clock position at the level of the rectum may reflect an internal hemorrhoid. Vascular/Lymphatic: There are mixed solid and cystic lesions within the retroperitoneum and centered predominantly in the aortocaval interval and tracking into the right iliac chain. Largest solid component appears to measure 5.9 x 4.3 x 6.4 cm in size  though accurate determination is difficult given the infiltrative appearance. A more intermediate attenuation possibly cystic component seen to the left of the aorta measures approximately 4 x 3.6 x 4.6 cm in size. The soft tissue lesions partially encase the abdominal aorta as well as the IMA origin, proximal right renal artery, and right common iliac. Furthermore, there is compression of the IVC with loss of clearly discernible fat plane and at least a small segment concerning for possible intraluminal extension (6/59). Reproductive: Uterus is surgically absent. No concerning adnexal lesions. Other: Retroperitoneal mixed solid and cystic appearing lesions, as above. Musculoskeletal: Multilevel degenerative changes are present in the imaged portions of the spine. No acute osseous abnormality or suspicious osseous lesion. No lytic or sclerotic bony lesions are seen. IMPRESSION: 1. Mixed solid and cystic appearing retroperitoneal lesions which may reflect conglomerate nodal mass lesions or metastases some of which may be necrotic. Alternative differential could include retroperitoneal fibrosis which is less favored given the overall masslike appearance and encasement rather than occlusion of the vessels as detailed above. Recommend correlation with patient exam findings and history. 2. Severe right hydroureteronephrosis to the level of the mid ureter where there is likely occlusion by the retroperitoneal lesions above. 3. Moderate left hydronephrosis appears secondary to a distended urinary bladder with large cystocele. No obstructive urolithiasis. 4. Focal nodular enhancement at the 10 o'clock  position at the level of the rectum with rectocele may reflect an internal hemorrhoid. Could correlate with exam findings. These results were called by telephone at the time of interpretation on 04/02/2019 at 10:24 pm to provider JULIE IDOL , who verbally acknowledged these results. Electronically Signed   By: Lovena Le M.D.    On: 04/02/2019 22:25    Microbiology: Recent Results (from the past 240 hour(s))  SARS CORONAVIRUS 2 (TAT 6-24 HRS) Nasopharyngeal Nasopharyngeal Swab     Status: None   Collection Time: 04/03/19  8:13 PM   Specimen: Nasopharyngeal Swab  Result Value Ref Range Status   SARS Coronavirus 2 NEGATIVE NEGATIVE Final    Comment: (NOTE) SARS-CoV-2 target nucleic acids are NOT DETECTED. The SARS-CoV-2 RNA is generally detectable in upper and lower respiratory specimens during the acute phase of infection. Negative results do not preclude SARS-CoV-2 infection, do not rule out co-infections with other pathogens, and should not be used as the sole basis for treatment or other patient management decisions. Negative results must be combined with clinical observations, patient history, and epidemiological information. The expected result is Negative. Fact Sheet for Patients: SugarRoll.be Fact Sheet for Healthcare Providers: https://www.woods-mathews.com/ This test is not yet approved or cleared by the [place] FDA and  has been authorized for detection and/or diagnosis of SARS-CoV-2 by FDA under an Emergency Use Authorization (EUA). This EUA will remain  in effect (meaning this test can be used) for the duration of the COVID-19 declaration under Section 56 4(b)(1) of the Act, 21 U.S.C. section 360bbb-3(b)(1), unless the authorization is terminated or revoked sooner. Performed at [facility], [place] [address] [place], [place] [address]      Labs: Basic Metabolic Panel: Recent Labs  Lab 04/02/19 2100 04/03/19 1820  NA 136 136  K 3.9 3.6  CL 101 102  CO2 23 24  GLUCOSE 186* 180*  BUN 13 14  CREATININE 0.69 0.82  CALCIUM 9.8 9.7   Liver Function Tests: Recent Labs  Lab 04/02/19 2100 04/03/19 1820  AST 16 18  ALT 15 14  ALKPHOS 82 75  BILITOT 0.5 0.5  PROT 8.1 7.8  ALBUMIN 4.8 4.4   Recent Labs  Lab 04/02/19 2100   LIPASE 14   CBC: Recent Labs  Lab 04/02/19 2100 04/03/19 1820 04/04/19 0501 04/05/19 0752  WBC 13.2* 11.6* 11.8* 8.8  NEUTROABS 10.5* 8.8* 7.9* 6.1  HGB 13.6 12.7 12.0 12.0  HCT 40.5 37.9 37.6 37.2  MCV 88.4 87.9 90.2 89.6  PLT 245 243 234 248   CBG: Recent Labs  Lab 04/04/19 1558 04/04/19 2215 04/05/19 0725 04/05/19 1113 04/05/19 1619  GLUCAP 140* 159* 156* 162* 127*    Signed:  Barton Dubois MD.  [facility] 04/05/2019, 4:29 PM

## 2019-04-05 NOTE — Plan of Care (Signed)

## 2019-04-06 LAB — AFP TUMOR MARKER: AFP, Serum, Tumor Marker: 4.4 ng/mL (ref 0.0–8.3)

## 2019-04-06 LAB — BETA HCG QUANT (REF LAB): hCG Quant: 4 m[IU]/mL

## 2019-04-07 ENCOUNTER — Encounter: Payer: Self-pay | Admitting: Internal Medicine

## 2019-04-07 ENCOUNTER — Ambulatory Visit (INDEPENDENT_AMBULATORY_CARE_PROVIDER_SITE_OTHER): Payer: Medicare Other | Admitting: Internal Medicine

## 2019-04-07 ENCOUNTER — Other Ambulatory Visit: Payer: Self-pay

## 2019-04-07 VITALS — BP 146/78 | HR 94 | Temp 98.4°F | Ht 64.0 in | Wt 197.2 lb

## 2019-04-07 DIAGNOSIS — I10 Essential (primary) hypertension: Secondary | ICD-10-CM

## 2019-04-07 DIAGNOSIS — E1140 Type 2 diabetes mellitus with diabetic neuropathy, unspecified: Secondary | ICD-10-CM

## 2019-04-07 DIAGNOSIS — K582 Mixed irritable bowel syndrome: Secondary | ICD-10-CM | POA: Diagnosis not present

## 2019-04-07 DIAGNOSIS — R1900 Intra-abdominal and pelvic swelling, mass and lump, unspecified site: Secondary | ICD-10-CM

## 2019-04-07 MED ORDER — LACTULOSE 20 GM/30ML PO SOLN: 30.0000 mL | Freq: Three times a day (TID) | ORAL | 5 refills | Status: DC | PRN

## 2019-04-07 NOTE — Progress Notes (Signed)
Subjective:    Patient ID: Laura [name], female    DOB: 1945/12/15, 74 y.o.   MRN: [id number]  HPI  Here to f/u recent hospn at [facility] with pain and abd swelling, CT with retroperitoneal mass, required morphine for pain control - MSIR 15 mg q 6 hrs, biopsy deferred, advised to f/u with PCP and oncology with PET scan and lab f/u.  Has some ongoing constipation issues, did have BM today no blood or pain, last BM prior was about 1 wk per pt.  Has hx of anxiety and multiple drug intolerances and wanted to come here for laxative recommendation. States pain overall improved, has only taken 2 pils since hosp d/c 3/23.  BP has been mild elevated but she does not want furhter tx for now.  BP up and down at home.   Past Medical History:  Diagnosis Date  . Anxiety   . Arthritis    back of neck, bones spurs on neck  . Cataract   . Cervical disc disease   . Diabetes mellitus   . Hyperlipidemia   . Hypertension   . Mucoid cyst of joint    right thumb  . Neuropathy   . Reflux   . Sleep apnea    wears CPAP nightly  . Vertigo    Past Surgical History:  Procedure Laterality Date  . BLADDER SURGERY    . BREAST EXCISIONAL BIOPSY Bilateral   . BREAST SURGERY    . CHOLECYSTECTOMY    . fibroids removed     breast (both breasts)  . MASS EXCISION Right 06/26/2016   Procedure: EXCISION MUCOID TUMOR RIGHT THUMB, IP RIGHT THUMB;  Surgeon: Daryll Brod, MD;  Location: [facility];  Service: Orthopedics;  Laterality: Right;  . PARTIAL HYSTERECTOMY      reports that she has never smoked. She has never used smokeless tobacco. She reports that she does not drink alcohol or use drugs. family history includes Diabetes in her father, maternal aunt, and sister; Hypertension in her father, mother, sister, and another family member; Stroke in her sister. Allergies  Allergen Reactions  . Hydrocodone-Homatropine Other (See Comments)    Vertigo *pt strongly prefers to never take*  . Sulfa  Antibiotics     Tongue swells, hives, itching  . Augmentin [Amoxicillin-Pot Clavulanate]     Diarrhea; can take PCN/ Amoxicillin  . Codeine     itch  . Crestor [Rosuvastatin Calcium] Other (See Comments)    Did something to memory   . Doxycycline   . Keflex [Cephalexin] Diarrhea and Nausea And Vomiting  . Lipitor [Atorvastatin Calcium] Other (See Comments)    Makes weak   . Naproxen Other (See Comments)    Stomach cramps  . Prednisone Other (See Comments)    Nervous *pt strongly prefers to never be given prednisone*    Current Outpatient Medications on File Prior to Visit  Medication Sig Dispense Refill  . acetaminophen (TYLENOL) 325 MG tablet Take 325 mg by mouth as needed for mild pain, moderate pain, fever or headache.     Marland Kitchen amLODipine (NORVASC) 5 MG tablet TAKE ONE (1) TABLET BY MOUTH EVERY DAY (Patient taking differently: Take 5 mg by mouth daily. ) 90 tablet 1  . Blood Glucose Monitoring Suppl (ONE TOUCH ULTRA 2) w/Device KIT Use as directed 1 each 0  . Cyanocobalamin (VITAMIN B-12 PO) Take by mouth.    Marland Kitchen glimepiride (AMARYL) 2 MG tablet TAKE ONETABLET BY MOUTH DAILY 90 tablet 3  .  Lancets (ONETOUCH ULTRASOFT) lancets USE TO CHECK BLOOD SUGAR TWICE DAILY 100 each 3  . LORazepam (ATIVAN) 0.5 MG tablet TAKE 1 TABLET BY MOUTH 2 TIMES DAILY AS NEEDED FOR ANXIETY (Patient taking differently: Take 0.5 mg by mouth 2 (two) times daily. ) 60 tablet 2  . morphine (MSIR) 15 MG tablet Take 1 tablet (15 mg total) by mouth every 6 (six) hours as needed for up to 7 days for moderate pain or severe pain. 28 tablet 0  . ONETOUCH ULTRA test strip USE TO CHECK BLOOD SUGARS TWO TIMES DAILY 100 each 3  . pantoprazole (PROTONIX) 40 MG tablet TAKE 1 TABLET (40 MG TOTAL) BY MOUTH DAILY. 90 tablet 1  . solifenacin (VESICARE) 5 MG tablet Take 5 mg by mouth daily.     No current facility-administered medications on file prior to visit.   Review of Systems All otherwise neg per pt     Objective:     Physical Exam BP (!) 146/78   Pulse 94   Temp 98.4 F (36.9 C)   Ht 5' 4"  (1.626 m)   Wt 197 lb 3.2 oz (89.4 kg)   SpO2 100%   BMI 33.85 kg/m  VS noted,  Constitutional: Pt appears in NAD HENT: Head: NCAT.  Right Ear: External ear normal.  Left Ear: External ear normal.  Eyes: . Pupils are equal, round, and reactive to light. Conjunctivae and EOM are normal Nose: without d/c or deformity Neck: Neck supple. Gross normal ROM Cardiovascular: Normal rate and regular rhythm.   Pulmonary/Chest: Effort normal and breath sounds without rales or wheezing.  Abd:  Soft, NT, ND, + BS, no organomegaly Neurological: Pt is alert. At baseline orientation, motor grossly intact Skin: Skin is warm. No rashes, other new lesions, no LE edema Psychiatric: Pt behavior is normal without agitation  All otherwise neg per pt Lab Results  Component Value Date   WBC 8.8 04/05/2019   HGB 12.0 04/05/2019   HCT 37.2 04/05/2019   PLT 248 04/05/2019   GLUCOSE 180 (H) 04/03/2019   CHOL 281 (H) 08/25/2018   TRIG 238.0 (H) 08/25/2018   HDL 52.20 08/25/2018   LDLDIRECT 207.0 08/25/2018   LDLCALC 166 (H) 04/12/2014   ALT 14 04/03/2019   AST 18 04/03/2019   NA 136 04/03/2019   K 3.6 04/03/2019   CL 102 04/03/2019   CREATININE 0.82 04/03/2019   BUN 14 04/03/2019   CO2 24 04/03/2019   TSH 1.65 03/09/2018   HGBA1C 7.3 (H) 04/04/2019   MICROALBUR 0.7 03/09/2018      Assessment & Plan:

## 2019-04-07 NOTE — Patient Instructions (Signed)
Please take all new medication as prescribed - the laxative  Please continue all other medications as before, and refills have been done if requested.  Please have the pharmacy call with any other refills you may need.  Please keep your appointments with your specialists as you may have planned - Mar 30 with Oncology  I think we can hold off on more lab work today  Please make an Appointment to return in 4 months, or sooner if needed

## 2019-04-10 ENCOUNTER — Encounter: Payer: Self-pay | Admitting: Internal Medicine

## 2019-04-10 NOTE — Assessment & Plan Note (Addendum)
D/w pt, to f/u oncology 3/30 with peT  I spent 33 minutes in preparing to see the patient by review of recent labs, imaging and procedures, obtaining and reviewing separately obtained history, communicating with the patient and family or caregiver, ordering medications, tests or procedures, and documenting clinical information in the EHR including the differential Dx, treatment, and any further evaluation and other management of mass, dm, iBS, HTN

## 2019-04-10 NOTE — Assessment & Plan Note (Signed)
stable overall by history and exam, recent data reviewed with pt, and pt to continue medical treatment as before,  to f/u any worsening symptoms or concerns  

## 2019-04-10 NOTE — Assessment & Plan Note (Signed)
With recent constipatoin, for lactulose prn

## 2019-04-11 ENCOUNTER — Encounter (HOSPITAL_COMMUNITY): Payer: Self-pay | Admitting: *Deleted

## 2019-04-11 NOTE — Progress Notes (Signed)
Oncology Navigator Note:  Patient was referred to our office following a visit to the ER.  I called patient today to introduce myself and provide information in how I will be involved with her care.  I provided information on their first visit and what to expect.  I made sure patient was aware of appointment time and directions to the cancer center.  My phone number was given so that she can call me with any questions or concerns.  Her only concerns at this time was knowing the difference in a MRI and a PET scan and if she would need something for her anxiety levels.  I explained the different and explained that she would get more details at tomorrow's visit.  She voices appreciation and understanding.

## 2019-04-12 ENCOUNTER — Ambulatory Visit (HOSPITAL_COMMUNITY): Payer: Medicare Other | Admitting: Hematology

## 2019-04-14 ENCOUNTER — Other Ambulatory Visit: Payer: Self-pay

## 2019-04-14 ENCOUNTER — Encounter: Payer: Self-pay | Admitting: Internal Medicine

## 2019-04-14 ENCOUNTER — Ambulatory Visit (INDEPENDENT_AMBULATORY_CARE_PROVIDER_SITE_OTHER): Payer: Medicare Other | Admitting: Internal Medicine

## 2019-04-14 DIAGNOSIS — N760 Acute vaginitis: Secondary | ICD-10-CM

## 2019-04-14 DIAGNOSIS — L03012 Cellulitis of left finger: Secondary | ICD-10-CM

## 2019-04-14 DIAGNOSIS — E1140 Type 2 diabetes mellitus with diabetic neuropathy, unspecified: Secondary | ICD-10-CM | POA: Diagnosis not present

## 2019-04-14 DIAGNOSIS — I10 Essential (primary) hypertension: Secondary | ICD-10-CM | POA: Diagnosis not present

## 2019-04-14 MED ORDER — FLUCONAZOLE 150 MG PO TABS: ORAL_TABLET | ORAL | 1 refills | Status: DC

## 2019-04-14 MED ORDER — CLINDAMYCIN HCL 300 MG PO CAPS: 300.0000 mg | ORAL_CAPSULE | Freq: Three times a day (TID) | ORAL | 0 refills | Status: DC

## 2019-04-14 NOTE — Patient Instructions (Signed)
Please take all new medication as prescribed - the cleocin for the infection, and diflucan as needed  Please continue all other medications as before, and refills have been done if requested.  Please have the pharmacy call with any other refills you may need.  Please keep your appointments with your specialists as you may have planned

## 2019-04-14 NOTE — Progress Notes (Signed)
Subjective:    Patient ID: Laura [name], female    DOB: Mar 26, 1945, 74 y.o.   MRN: [id number]  HPI  Here with acute onset left 4th finger red, tender, swelling starting about the nail for 2 days, without drainage, fever, chills, Pt denies chest pain, increased sob or doe, wheezing, orthopnea, PND, increased LE swelling, palpitations, dizziness or syncope.   Pt denies polydipsia, polyuria.  Also with vaginal d/c incidental for 3 days itchy like prior yeast infections.  Denies urinary symptoms such as dysuria, frequency, urgency, flank pain, hematuria or n/v, fever, chills. Past Medical History:  Diagnosis Date  . Anxiety   . Arthritis    back of neck, bones spurs on neck  . Cataract   . Cervical disc disease   . Diabetes mellitus   . Hyperlipidemia   . Hypertension   . Mucoid cyst of joint    right thumb  . Neuropathy   . Reflux   . Sleep apnea    wears CPAP nightly  . Vertigo    Past Surgical History:  Procedure Laterality Date  . BLADDER SURGERY    . BREAST EXCISIONAL BIOPSY Bilateral   . BREAST SURGERY    . CHOLECYSTECTOMY    . fibroids removed     breast (both breasts)  . MASS EXCISION Right 06/26/2016   Procedure: EXCISION MUCOID TUMOR RIGHT THUMB, IP RIGHT THUMB;  Surgeon: Daryll Brod, MD;  Location: [facility];  Service: Orthopedics;  Laterality: Right;  . PARTIAL HYSTERECTOMY      reports that she has never smoked. She has never used smokeless tobacco. She reports that she does not drink alcohol or use drugs. family history includes Diabetes in her father, maternal aunt, and sister; Hypertension in her father, mother, sister, and another family member; Stroke in her sister. Allergies  Allergen Reactions  . Hydrocodone-Homatropine Other (See Comments)    Vertigo *pt strongly prefers to never take*  . Sulfa Antibiotics     Tongue swells, hives, itching  . Augmentin [Amoxicillin-Pot Clavulanate]     Diarrhea; can take PCN/ Amoxicillin  . Codeine      itch  . Crestor [Rosuvastatin Calcium] Other (See Comments)    Did something to memory   . Doxycycline   . Keflex [Cephalexin] Diarrhea and Nausea And Vomiting  . Lipitor [Atorvastatin Calcium] Other (See Comments)    Makes weak   . Naproxen Other (See Comments)    Stomach cramps  . Prednisone Other (See Comments)    Nervous *pt strongly prefers to never be given prednisone*    Current Outpatient Medications on File Prior to Visit  Medication Sig Dispense Refill  . acetaminophen (TYLENOL) 325 MG tablet Take 325 mg by mouth as needed for mild pain, moderate pain, fever or headache.     Marland Kitchen amLODipine (NORVASC) 5 MG tablet TAKE ONE (1) TABLET BY MOUTH EVERY DAY (Patient taking differently: Take 5 mg by mouth daily. ) 90 tablet 1  . Blood Glucose Monitoring Suppl (ONE TOUCH ULTRA 2) w/Device KIT Use as directed 1 each 0  . Cholecalciferol (VITAMIN D3) 50 MCG (2000 UT) TABS Take by mouth.    . Cyanocobalamin (VITAMIN B-12 PO) Take by mouth.    Marland Kitchen glimepiride (AMARYL) 2 MG tablet TAKE ONETABLET BY MOUTH DAILY 90 tablet 3  . Lactulose 20 GM/30ML SOLN Take 30 mLs (20 g total) by mouth 3 (three) times daily as needed. 450 mL 5  . Lancets (ONETOUCH ULTRASOFT) lancets USE TO  CHECK BLOOD SUGAR TWICE DAILY 100 each 3  . ONETOUCH ULTRA test strip USE TO CHECK BLOOD SUGARS TWO TIMES DAILY 100 each 3  . pantoprazole (PROTONIX) 40 MG tablet TAKE 1 TABLET (40 MG TOTAL) BY MOUTH DAILY. 90 tablet 1  . solifenacin (VESICARE) 5 MG tablet Take 5 mg by mouth daily.    Marland Kitchen LORazepam (ATIVAN) 0.5 MG tablet TAKE 1 TABLET BY MOUTH 2 TIMES DAILY AS NEEDED FOR ANXIETY (Patient not taking: No sig reported) 60 tablet 2   No current facility-administered medications on file prior to visit.   Review of Systems All otherwise neg per pt     Objective:   Physical Exam BP (!) 144/82   Pulse 78   Temp 98.4 F (36.9 C)   Ht 5' 4"  (1.626 m)   Wt 196 lb (88.9 kg)   SpO2 100%   BMI 33.64 kg/m  VS noted,    Constitutional: Pt appears in NAD HENT: Head: NCAT.  Right Ear: External ear normal.  Left Ear: External ear normal.  Eyes: . Pupils are equal, round, and reactive to light. Conjunctivae and EOM are normal Nose: without d/c or deformity Neck: Neck supple. Gross normal ROM Cardiovascular: Normal rate and regular rhythm.   Pulmonary/Chest: Effort normal and breath sounds without rales or wheezing.  Abd:  Soft, NT, ND, + BS, no organomegaly Neurological: Pt is alert. At baseline orientation, motor grossly intact Skin: Skin is warm. No rashes, other new lesions, no LE edema., buy has left 4th finger red, tender swelling 1+ with whitish pus mild at base and medial aspect of nail Psychiatric: Pt behavior is normal without agitation  All otherwise neg per pt Lab Results  Component Value Date   WBC 8.8 04/05/2019   HGB 12.0 04/05/2019   HCT 37.2 04/05/2019   PLT 248 04/05/2019   GLUCOSE 180 (H) 04/03/2019   CHOL 281 (H) 08/25/2018   TRIG 238.0 (H) 08/25/2018   HDL 52.20 08/25/2018   LDLDIRECT 207.0 08/25/2018   LDLCALC 166 (H) 04/12/2014   ALT 14 04/03/2019   AST 18 04/03/2019   NA 136 04/03/2019   K 3.6 04/03/2019   CL 102 04/03/2019   CREATININE 0.82 04/03/2019   BUN 14 04/03/2019   CO2 24 04/03/2019   TSH 1.65 03/09/2018   HGBA1C 7.3 (H) 04/04/2019   MICROALBUR 0.7 03/09/2018      Assessment & Plan:

## 2019-04-15 ENCOUNTER — Other Ambulatory Visit: Payer: Self-pay | Admitting: *Deleted

## 2019-04-15 ENCOUNTER — Encounter: Payer: Self-pay | Admitting: Internal Medicine

## 2019-04-15 DIAGNOSIS — N760 Acute vaginitis: Secondary | ICD-10-CM | POA: Insufficient documentation

## 2019-04-15 DIAGNOSIS — L03012 Cellulitis of left finger: Secondary | ICD-10-CM | POA: Insufficient documentation

## 2019-04-15 NOTE — Patient Outreach (Signed)
Telephone outreach for a RED FLAG on [facility]i Discharge call: Questions.  No answer at pt home, left a message and requested a return call.  Laura [name]. Myrtie Neither, MSN, [facility] Gerontological Nurse Practitioner [facility] Care Management [phone]

## 2019-04-15 NOTE — Assessment & Plan Note (Signed)
stable overall by history and exam, recent data reviewed with pt, and pt to continue medical treatment as before,  to f/u any worsening symptoms or concerns  

## 2019-04-15 NOTE — Assessment & Plan Note (Signed)
For diflucan asd,  to f/u any worsening symptoms or concerns 

## 2019-04-15 NOTE — Assessment & Plan Note (Addendum)
Mild to mod, for antibx course,  to f/u any worsening symptoms or concerns  I spent 30 minutes in preparing to see the patient by review of recent labs, imaging and procedures, obtaining and reviewing separately obtained history, communicating with the patient and family or caregiver, ordering medications, tests or procedures, and documenting clinical information in the EHR including the differential Dx, treatment, and any further evaluation and other management of left paronychia, vaginitis, DM, HTN

## 2019-04-18 ENCOUNTER — Other Ambulatory Visit: Payer: Self-pay | Admitting: *Deleted

## 2019-04-18 ENCOUNTER — Encounter: Payer: Self-pay | Admitting: *Deleted

## 2019-04-18 NOTE — Patient Outreach (Signed)
Referral for RED FLAG on EMMI discharge call: questions.  Talked with Laura [name] today. She says her question was, "What is this EMMI program?" She reports lots of scammers and being very cautious not to be involved or talk with anyone that could potentially be a danger to her.  Explained the [facility] discharge calls.  She reported that her hospital experience was excellent, from the time she entered the ER, from every staff member she encountered, till she went out the door.  Today, she is feeling good. She only had to take the strong pain medications about 5 times when she came home after surgery and since she is only taking her OTC pain med. Her recovery has been smooth and she is able to be independent again.  She has all her medications and will be seeing the oncologist on Wednesday. She will be scheduled for a CT.  Educated her about [place]. She feels she is not in the need at this time, but would like our information. Will send her a letter with our brochure.  Laura [name]. Laura Neither, MSN, [facility] Gerontological Nurse Practitioner [facility] Care Management [phone]

## 2019-04-20 ENCOUNTER — Inpatient Hospital Stay (HOSPITAL_COMMUNITY): Payer: Medicare Other | Attending: Hematology | Admitting: Hematology

## 2019-04-20 ENCOUNTER — Other Ambulatory Visit: Payer: Self-pay

## 2019-04-20 ENCOUNTER — Encounter (HOSPITAL_COMMUNITY): Payer: Self-pay | Admitting: Hematology

## 2019-04-20 VITALS — BP 159/68 | HR 89 | Temp 97.5°F | Resp 18 | Ht 64.0 in | Wt 197.0 lb

## 2019-04-20 DIAGNOSIS — G4700 Insomnia, unspecified: Secondary | ICD-10-CM | POA: Insufficient documentation

## 2019-04-20 DIAGNOSIS — E785 Hyperlipidemia, unspecified: Secondary | ICD-10-CM | POA: Diagnosis not present

## 2019-04-20 DIAGNOSIS — M129 Arthropathy, unspecified: Secondary | ICD-10-CM | POA: Insufficient documentation

## 2019-04-20 DIAGNOSIS — R350 Frequency of micturition: Secondary | ICD-10-CM | POA: Insufficient documentation

## 2019-04-20 DIAGNOSIS — Z79899 Other long term (current) drug therapy: Secondary | ICD-10-CM | POA: Insufficient documentation

## 2019-04-20 DIAGNOSIS — I10 Essential (primary) hypertension: Secondary | ICD-10-CM | POA: Diagnosis not present

## 2019-04-20 DIAGNOSIS — K219 Gastro-esophageal reflux disease without esophagitis: Secondary | ICD-10-CM | POA: Diagnosis not present

## 2019-04-20 DIAGNOSIS — Z7984 Long term (current) use of oral hypoglycemic drugs: Secondary | ICD-10-CM | POA: Diagnosis not present

## 2019-04-20 DIAGNOSIS — R1900 Intra-abdominal and pelvic swelling, mass and lump, unspecified site: Secondary | ICD-10-CM | POA: Diagnosis not present

## 2019-04-20 DIAGNOSIS — K5900 Constipation, unspecified: Secondary | ICD-10-CM | POA: Diagnosis not present

## 2019-04-20 DIAGNOSIS — E119 Type 2 diabetes mellitus without complications: Secondary | ICD-10-CM | POA: Insufficient documentation

## 2019-04-20 DIAGNOSIS — F419 Anxiety disorder, unspecified: Secondary | ICD-10-CM | POA: Insufficient documentation

## 2019-04-20 DIAGNOSIS — G629 Polyneuropathy, unspecified: Secondary | ICD-10-CM | POA: Diagnosis not present

## 2019-04-20 DIAGNOSIS — R1030 Lower abdominal pain, unspecified: Secondary | ICD-10-CM | POA: Diagnosis not present

## 2019-04-20 DIAGNOSIS — R1909 Other intra-abdominal and pelvic swelling, mass and lump: Secondary | ICD-10-CM | POA: Diagnosis not present

## 2019-04-20 NOTE — Progress Notes (Signed)
[address], [place] [address]   CLINIC:  Medical Oncology/Hematology  PCP:  Biagio Borg, MD [place] [address] [phone]   REASON FOR VISIT:  Follow-up for retroperitoneal masses.  CURRENT THERAPY: Under work-up.   INTERVAL HISTORY:  Laura [name] 74 y.o. female seen for follow-up of retroperitoneal mass.  She was initially seen by me as a consultation while she was admitted to the hospital with right posterior back pain radiating to the right line.  At that time CT scan showed masses in the retroperitoneal region.  She was admitted for pain control which was adequately controlled with morphine at that time.  I have ordered labs for germ cell tumors.  She reports some constipation.  Her pain has apparently gotten better.  She has not taken back more than 7 pills of morphine since she was released from the hospital.  She has some difficulty sleeping due to frequency of urination.  She does report some constipation.  Appetite is 100%.  Energy levels are 50%.  Denies any fevers, night sweats or weight loss in the last 6 months.    REVIEW OF SYSTEMS:  Review of Systems  Gastrointestinal: Positive for constipation.  Psychiatric/Behavioral: Positive for sleep disturbance.  All other systems reviewed and are negative.    PAST MEDICAL/SURGICAL HISTORY:  Past Medical History:  Diagnosis Date  . Anxiety   . Arthritis    back of neck, bones spurs on neck  . Cataract   . Cervical disc disease   . Diabetes mellitus   . Hyperlipidemia   . Hypertension   . Mucoid cyst of joint    right thumb  . Neuropathy   . Reflux   . Sleep apnea    wears CPAP nightly  . Vertigo    Past Surgical History:  Procedure Laterality Date  . BLADDER SURGERY    . BREAST EXCISIONAL BIOPSY Bilateral   . BREAST SURGERY    . CHOLECYSTECTOMY    . fibroids removed     breast (both breasts)  . MASS EXCISION Right 06/26/2016   Procedure:  EXCISION MUCOID TUMOR RIGHT THUMB, IP RIGHT THUMB;  Surgeon: Daryll Brod, MD;  Location: [facility];  Service: Orthopedics;  Laterality: Right;  . PARTIAL HYSTERECTOMY       SOCIAL HISTORY:  Social History   Socioeconomic History  . Marital status: Divorced    Spouse name: Not on file  . Number of children: 4  . Years of education: 43  . Highest education level: Not on file  Occupational History  . Occupation: retired Geographical information systems officer  Tobacco Use  . Smoking status: Never Smoker  . Smokeless tobacco: Never Used  Substance and Sexual Activity  . Alcohol use: No    Alcohol/week: 0.0 standard drinks  . Drug use: No  . Sexual activity: Not Currently  Other Topics Concern  . Not on file  Social History Narrative  . Not on file   Social Determinants of Health   Financial Resource Strain: Low Risk   . Difficulty of Paying Living Expenses: Not hard at all  Food Insecurity: No Food Insecurity  . Worried About Charity fundraiser in the Last Year: Never true  . Ran Out of Food in the Last Year: Never true  Transportation Needs: No Transportation Needs  . Lack of Transportation (Medical): No  . Lack of Transportation (Non-Medical): No  Physical Activity: Inactive  . Days  of Exercise per Week: 0 days  . Minutes of Exercise per Session: 0 min  Stress: No Stress Concern Present  . Feeling of Stress : Not at all  Social Connections: Slightly Isolated  . Frequency of Communication with Friends and Family: More than three times a week  . Frequency of Social Gatherings with Friends and Family: More than three times a week  . Attends Religious Services: More than 4 times per year  . Active Member of Clubs or Organizations: Yes  . Attends Archivist Meetings: Never  . Marital Status: Divorced  Human resources officer Violence: Not At Risk  . Fear of Current or Ex-Partner: No  . Emotionally Abused: No  . Physically Abused: No  . Sexually Abused: No     FAMILY HISTORY:  Family History  Problem Relation Age of Onset  . Hypertension Other   . Diabetes Father   . Hypertension Father   . Diabetes Sister   . Hypertension Mother   . Hypertension Sister   . Stroke Sister   . Diabetes Maternal Aunt   . Colon cancer Neg Hx   . Esophageal cancer Neg Hx   . Stomach cancer Neg Hx   . Rectal cancer Neg Hx     CURRENT MEDICATIONS:  Outpatient Encounter Medications as of 04/20/2019  Medication Sig  . amLODipine (NORVASC) 5 MG tablet TAKE ONE (1) TABLET BY MOUTH EVERY DAY (Patient taking differently: Take 5 mg by mouth daily. )  . Blood Glucose Monitoring Suppl (ONE TOUCH ULTRA 2) w/Device KIT Use as directed  . Cholecalciferol (VITAMIN D3) 50 MCG (2000 UT) TABS Take by mouth.  . clindamycin (CLEOCIN) 300 MG capsule Take 1 capsule (300 mg total) by mouth 3 (three) times daily.  . Cyanocobalamin (VITAMIN B-12 PO) Take by mouth.  Marland Kitchen glimepiride (AMARYL) 2 MG tablet TAKE ONETABLET BY MOUTH DAILY  . Lancets (ONETOUCH ULTRASOFT) lancets USE TO CHECK BLOOD SUGAR TWICE DAILY  . ONETOUCH ULTRA test strip USE TO CHECK BLOOD SUGARS TWO TIMES DAILY  . acetaminophen (TYLENOL) 325 MG tablet Take 325 mg by mouth as needed for mild pain, moderate pain, fever or headache.   . fluconazole (DIFLUCAN) 150 MG tablet 1 tab by mouth every 3 days as needed (Patient not taking: Reported on 04/20/2019)  . LORazepam (ATIVAN) 0.5 MG tablet TAKE 1 TABLET BY MOUTH 2 TIMES DAILY AS NEEDED FOR ANXIETY (Patient not taking: No sig reported)  . pantoprazole (PROTONIX) 40 MG tablet TAKE 1 TABLET (40 MG TOTAL) BY MOUTH DAILY. (Patient taking differently: Take 40 mg by mouth as needed. )  . solifenacin (VESICARE) 5 MG tablet Take 5 mg by mouth as needed.   . [DISCONTINUED] Lactulose 20 GM/30ML SOLN Take 30 mLs (20 g total) by mouth 3 (three) times daily as needed.   No facility-administered encounter medications on file as of 04/20/2019.    ALLERGIES:  Allergies  Allergen  Reactions  . Hydrocodone-Homatropine Other (See Comments)    Vertigo *pt strongly prefers to never take*  . Sulfa Antibiotics     Tongue swells, hives, itching  . Augmentin [Amoxicillin-Pot Clavulanate]     Diarrhea; can take PCN/ Amoxicillin  . Codeine     itch  . Crestor [Rosuvastatin Calcium] Other (See Comments)    Did something to memory   . Doxycycline   . Keflex [Cephalexin] Diarrhea and Nausea And Vomiting  . Lipitor [Atorvastatin Calcium] Other (See Comments)    Makes weak   . Naproxen Other (See  Comments)    Stomach cramps  . Prednisone Other (See Comments)    Nervous *pt strongly prefers to never be given prednisone*      PHYSICAL EXAM:  ECOG Performance status: 1  Vitals:   04/20/19 1405  BP: (!) 159/68  Pulse: 89  Resp: 18  Temp: (!) 97.5 F (36.4 C)  SpO2: 100%   Filed Weights   04/20/19 1405  Weight: 197 lb (89.4 kg)    Physical Exam Vitals reviewed.  Constitutional:      Appearance: Normal appearance.  Cardiovascular:     Rate and Rhythm: Normal rate and regular rhythm.     Heart sounds: Normal heart sounds.  Pulmonary:     Effort: Pulmonary effort is normal.     Breath sounds: Normal breath sounds.  Abdominal:     General: There is no distension.     Palpations: Abdomen is soft. There is no mass.  Lymphadenopathy:     Cervical: No cervical adenopathy.  Skin:    General: Skin is warm.  Neurological:     General: No focal deficit present.     Mental Status: She is alert and oriented to person, place, and time.  Psychiatric:        Mood and Affect: Mood normal.        Behavior: Behavior normal.      LABORATORY DATA:  I have reviewed the labs as listed.  CBC    Component Value Date/Time   WBC 8.8 04/05/2019 0752   RBC 4.15 04/05/2019 0752   HGB 12.0 04/05/2019 0752   HCT 37.2 04/05/2019 0752   PLT 248 04/05/2019 0752   MCV 89.6 04/05/2019 0752   MCH 28.9 04/05/2019 0752   MCHC 32.3 04/05/2019 0752   RDW 12.9 04/05/2019  0752   LYMPHSABS 2.0 04/05/2019 0752   MONOABS 0.6 04/05/2019 0752   EOSABS 0.1 04/05/2019 0752   BASOSABS 0.0 04/05/2019 0752   CMP Latest Ref Rng & Units 04/03/2019 04/02/2019 08/25/2018  Glucose 70 - 99 mg/dL 180(H) 186(H) 111(H)  BUN 8 - 23 mg/dL _0 Creatinine 0.44 - 1.00 mg/dL 0.82 0.69 0.90  Sodium 135 - 145 mmol/L 136 136 140  Potassium 3.5 - 5.1 mmol/L 3.6 3.9 4.1  Chloride 98 - 111 mmol/L 102 101 106  CO2 22 - 32 mmol/L _1 Calcium 8.9 - 10.3 mg/dL 9.7 9.8 10.2  Total Protein 6.5 - 8.1 g/dL 7.8 8.1 7.7  Total Bilirubin 0.3 - 1.2 mg/dL 0.5 0.5 0.3  Alkaline Phos 38 - 126 U/L 75 82 81  AST 15 - 41 U/L _2 ALT 0 - 44 U/L _3 DIAGNOSTIC IMAGING:  I have independently reviewed the scans and discussed with the patient.    ASSESSMENT & PLAN:   Retroperitoneal mass 1.  Retroperitoneal mass: -She was seen by me in the hospital on 04/04/2019 when she was admitted for right-sided back pain.  CT scan showed retroperitoneal mass. -Work-up for germ cell tumor showed normal LDH, beta hCG and AFP. -Since she was discharged home, she reported improvement in the pain. -CT of the chest did not show any suspicious masses. -As the retroperitoneal mass is in a difficult area for biopsy, I have recommended a PET CT scan. -PET scan will help [place] identify biopsy site which can be safer. -We will schedule her for PET scan and see her back after that.  2.  Right  loin pain: -She was admitted with pain in the right loin radiating to the groin region. -She reported improvement in the pain since discharge.  She has only taking 7 pills of morphine IR 15 mg since discharge.  Last pill was taken 3 days ago. -She is taking extra strength Tylenol for mild pains.  Total time spent is 40 minutes with more than 70% of the time spent face-to-face discussing scan results, lab results, further work-up, counseling and coordination of care.    Orders placed this encounter:   Orders Placed This Encounter  Procedures  . NM PET Image Initial (PI) Skull Base To Thigh      Derek Jack, MD [place] [phone]

## 2019-04-20 NOTE — Progress Notes (Deleted)
[facility] NOTE  Patient Care Team: Biagio Borg, MD as PCP - General (Internal Medicine) Milus Banister, MD as Attending Physician (Gastroenterology) Melida Quitter, MD as Attending Physician (Otolaryngology) Martinique, Peter M, MD (Cardiology) Jessy Oto, MD as Consulting Physician (Orthopedic Surgery) Derek Jack, MD as Medical Oncologist (Oncology) Donetta Potts, RN as Oncology Nurse Navigator (Oncology)  CHIEF COMPLAINTS/PURPOSE OF CONSULTATION:  ***  HISTORY OF PRESENTING ILLNESS:  Laura [name] 74 y.o. female is here because of ***  MEDICAL HISTORY:  Past Medical History:  Diagnosis Date  . Anxiety   . Arthritis    back of neck, bones spurs on neck  . Cataract   . Cervical disc disease   . Diabetes mellitus   . Hyperlipidemia   . Hypertension   . Mucoid cyst of joint    right thumb  . Neuropathy   . Reflux   . Sleep apnea    wears CPAP nightly  . Vertigo     SURGICAL HISTORY: Past Surgical History:  Procedure Laterality Date  . BLADDER SURGERY    . BREAST EXCISIONAL BIOPSY Bilateral   . BREAST SURGERY    . CHOLECYSTECTOMY    . fibroids removed     breast (both breasts)  . MASS EXCISION Right 06/26/2016   Procedure: EXCISION MUCOID TUMOR RIGHT THUMB, IP RIGHT THUMB;  Surgeon: Daryll Brod, MD;  Location: [place];  Service: Orthopedics;  Laterality: Right;  . PARTIAL HYSTERECTOMY      SOCIAL HISTORY: Social History   Socioeconomic History  . Marital status: Divorced    Spouse name: Not on file  . Number of children: 4  . Years of education: 15  . Highest education level: Not on file  Occupational History  . Occupation: retired Geographical information systems officer  Tobacco Use  . Smoking status: Never Smoker  . Smokeless tobacco: Never Used  Substance and Sexual Activity  . Alcohol use: No    Alcohol/week: 0.0 standard drinks  . Drug use: No  . Sexual activity: Not Currently  Other Topics Concern   . Not on file  Social History Narrative  . Not on file   Social Determinants of Health   Financial Resource Strain:   . Difficulty of Paying Living Expenses:   Food Insecurity:   . Worried About Charity fundraiser in the Last Year:   . Arboriculturist in the Last Year:   Transportation Needs:   . Film/video editor (Medical):   Marland Kitchen Lack of Transportation (Non-Medical):   Physical Activity:   . Days of Exercise per Week:   . Minutes of Exercise per Session:   Stress:   . Feeling of Stress :   Social Connections:   . Frequency of Communication with Friends and Family:   . Frequency of Social Gatherings with Friends and Family:   . Attends Religious Services:   . Active Member of Clubs or Organizations:   . Attends Archivist Meetings:   Marland Kitchen Marital Status:   Intimate Partner Violence: Not At Risk  . Fear of Current or Ex-Partner: No  . Emotionally Abused: No  . Physically Abused: No  . Sexually Abused: No    FAMILY HISTORY: Family History  Problem Relation Age of Onset  . Hypertension Other   . Diabetes Father   . Hypertension Father   . Diabetes Sister   . Hypertension Mother   . Hypertension Sister   . Stroke  Sister   . Diabetes Maternal Aunt   . Colon cancer Neg Hx   . Esophageal cancer Neg Hx   . Stomach cancer Neg Hx   . Rectal cancer Neg Hx     ALLERGIES:  is allergic to hydrocodone-homatropine; sulfa antibiotics; augmentin [amoxicillin-pot clavulanate]; codeine; crestor [rosuvastatin calcium]; doxycycline; keflex [cephalexin]; lipitor [atorvastatin calcium]; naproxen; and prednisone.  MEDICATIONS:  Current Outpatient Medications  Medication Sig Dispense Refill  . amLODipine (NORVASC) 5 MG tablet TAKE ONE (1) TABLET BY MOUTH EVERY DAY (Patient taking differently: Take 5 mg by mouth daily. ) 90 tablet 1  . Blood Glucose Monitoring Suppl (ONE TOUCH ULTRA 2) w/Device KIT Use as directed 1 each 0  . Cholecalciferol (VITAMIN D3) 50 MCG (2000 UT)  TABS Take by mouth.    . clindamycin (CLEOCIN) 300 MG capsule Take 1 capsule (300 mg total) by mouth 3 (three) times daily. 21 capsule 0  . Cyanocobalamin (VITAMIN B-12 PO) Take by mouth.    Marland Kitchen glimepiride (AMARYL) 2 MG tablet TAKE ONETABLET BY MOUTH DAILY 90 tablet 3  . Lancets (ONETOUCH ULTRASOFT) lancets USE TO CHECK BLOOD SUGAR TWICE DAILY 100 each 3  . ONETOUCH ULTRA test strip USE TO CHECK BLOOD SUGARS TWO TIMES DAILY 100 each 3  . acetaminophen (TYLENOL) 325 MG tablet Take 325 mg by mouth as needed for mild pain, moderate pain, fever or headache.     . fluconazole (DIFLUCAN) 150 MG tablet 1 tab by mouth every 3 days as needed (Patient not taking: Reported on 04/20/2019) 2 tablet 1  . LORazepam (ATIVAN) 0.5 MG tablet TAKE 1 TABLET BY MOUTH 2 TIMES DAILY AS NEEDED FOR ANXIETY (Patient not taking: No sig reported) 60 tablet 2  . pantoprazole (PROTONIX) 40 MG tablet TAKE 1 TABLET (40 MG TOTAL) BY MOUTH DAILY. (Patient taking differently: Take 40 mg by mouth as needed. ) 90 tablet 1  . solifenacin (VESICARE) 5 MG tablet Take 5 mg by mouth as needed.      No current facility-administered medications for this visit.    REVIEW OF SYSTEMS:   Constitutional: Denies fevers, chills or abnormal night sweats Eyes: Denies blurriness of vision, double vision or watery eyes Ears, nose, mouth, throat, and face: Denies mucositis or sore throat Respiratory: Denies cough, dyspnea or wheezes Cardiovascular: Denies palpitation, chest discomfort or lower extremity swelling Gastrointestinal:  Denies nausea, heartburn or change in bowel habits Skin: Denies abnormal skin rashes Lymphatics: Denies new lymphadenopathy or easy bruising Neurological:Denies numbness, tingling or new weaknesses Behavioral/Psych: Mood is stable, no new changes  All other systems were reviewed with the patient and are negative.  PHYSICAL EXAMINATION: ECOG PERFORMANCE STATUS: {CHL ONC ECOG PS:[id number]}  There were no vitals filed  for this visit. There were no vitals filed for this visit.  GENERAL:alert, no distress and comfortable SKIN: skin color, texture, turgor are normal, no rashes or significant lesions EYES: normal, conjunctiva are pink and non-injected, sclera clear OROPHARYNX:no exudate, no erythema and lips, buccal mucosa, and tongue normal  NECK: supple, thyroid normal size, non-tender, without nodularity LYMPH:  no palpable lymphadenopathy in the cervical, axillary or inguinal LUNGS: clear to auscultation and percussion with normal breathing effort HEART: regular rate & rhythm and no murmurs and no lower extremity edema ABDOMEN:abdomen soft, non-tender and normal bowel sounds Musculoskeletal:no cyanosis of digits and no clubbing  PSYCH: alert & oriented x 3 with fluent speech NEURO: no focal motor/sensory deficits  LABORATORY DATA:  I have reviewed the data as listed Lab  Results  Component Value Date   WBC 8.8 04/05/2019   HGB 12.0 04/05/2019   HCT 37.2 04/05/2019   MCV 89.6 04/05/2019   PLT 248 04/05/2019     Chemistry      Component Value Date/Time   NA 136 04/03/2019 1820   K 3.6 04/03/2019 1820   CL 102 04/03/2019 1820   CO2 24 04/03/2019 1820   BUN 14 04/03/2019 1820   CREATININE 0.82 04/03/2019 1820      Component Value Date/Time   CALCIUM 9.7 04/03/2019 1820   ALKPHOS 75 04/03/2019 1820   AST 18 04/03/2019 1820   ALT 14 04/03/2019 1820   BILITOT 0.5 04/03/2019 1820       RADIOGRAPHIC STUDIES: I have personally reviewed the radiological images as listed and agreed with the findings in the report. CT CHEST W CONTRAST  Result Date: 04/04/2019 CLINICAL DATA:  Retroperitoneal adenopathy in the lower abdomen/pelvis. Now for further assessment EXAM: CT CHEST WITH CONTRAST TECHNIQUE: Multidetector CT imaging of the chest was performed during intravenous contrast administration. CONTRAST:  62m OMNIPAQUE IOHEXOL 300 MG/ML  SOLN COMPARISON:  None. FINDINGS: Cardiovascular: Heart is  normal size. Diffusely calcified coronary arteries. Aortic atherosclerosis. No aneurysm. Mediastinum/Nodes: No mediastinal, hilar, or axillary adenopathy. Trachea and esophagus are unremarkable. Thyroid unremarkable. Small hiatal hernia. Lungs/Pleura: Linear atelectasis or scarring in the right lung base. No confluent opacities or effusions. No suspicious pulmonary nodules. Upper Abdomen: Imaging into the upper abdomen shows no acute findings. Musculoskeletal: Calcifications within the left breast appear benign. Recommend correlation with mammography. No acute bony abnormality. IMPRESSION: No additional abnormality seen within the chest to provide tissue diagnosis. No acute cardiopulmonary disease. Coronary artery disease Aortic Atherosclerosis (ICD10-I70.0). Electronically Signed   By: KRolm BaptiseM.D.   On: 04/04/2019 22:11   CT ABDOMEN PELVIS W CONTRAST  Result Date: 04/02/2019 CLINICAL DATA:  Right flank and abdominal pain, began yesterday, constipation acute though fall EXAM: CT ABDOMEN AND PELVIS WITH CONTRAST TECHNIQUE: Multidetector CT imaging of the abdomen and pelvis was performed using the standard protocol following bolus administration of intravenous contrast. CONTRAST:  1089mOMNIPAQUE IOHEXOL 300 MG/ML  SOLN COMPARISON:  None. FINDINGS: Lower chest: Lung bases are clear. Normal heart size. No pericardial effusion. Hepatobiliary: No focal liver abnormality is seen. Patient is post cholecystectomy. Slight prominence of the biliary tree likely related to reservoir effect. No calcified intraductal gallstones. Pancreas: Unremarkable. No pancreatic ductal dilatation or surrounding inflammatory changes. Spleen: Normal in size without focal abnormality. Adrenals/Urinary Tract: Normal adrenal glands. No visible or contour deforming renal lesions. There is mild moderate to severe right hydroureteronephrosis with a an suspected ureteral obstruction at the level the soft tissue lesions in the retroperitoneum  further detailed below. There is associated moderate right perinephric stranding. More mild left hydronephrosis is seen to the level of the distended urinary bladder. The left ureter courses in close proximity to several retroperitoneal soft tissue and cystic lesions but does not appear frankly occluded at these levels. No obstructive urolithiasis is evident. The bladder itself is markedly distended with chest a pronounced cystocele. Stomach/Bowel: Small hiatal hernia. Distal stomach is unremarkable. The second portion of the duodenum courses in close proximity to the soft tissue attenuation lesions in the retroperitoneum including loss of discernible fat plane at the level of the distal second portion (7/30). No small bowel dilatation or wall thickening though several additional loops of small bowel do also course in close proximity to the retroperitoneal lesions (2/54, 5/56). No evidence of  small-bowel obstruction. No colonic dilatation or wall thickening. A normal appendix is visualized. Focal nodular enhancement at the 10 o'clock position at the level of the rectum may reflect an internal hemorrhoid. Vascular/Lymphatic: There are mixed solid and cystic lesions within the retroperitoneum and centered predominantly in the aortocaval interval and tracking into the right iliac chain. Largest solid component appears to measure 5.9 x 4.3 x 6.4 cm in size though accurate determination is difficult given the infiltrative appearance. A more intermediate attenuation possibly cystic component seen to the left of the aorta measures approximately 4 x 3.6 x 4.6 cm in size. The soft tissue lesions partially encase the abdominal aorta as well as the IMA origin, proximal right renal artery, and right common iliac. Furthermore, there is compression of the IVC with loss of clearly discernible fat plane and at least a small segment concerning for possible intraluminal extension (6/59). Reproductive: Uterus is surgically absent. No  concerning adnexal lesions. Other: Retroperitoneal mixed solid and cystic appearing lesions, as above. Musculoskeletal: Multilevel degenerative changes are present in the imaged portions of the spine. No acute osseous abnormality or suspicious osseous lesion. No lytic or sclerotic bony lesions are seen. IMPRESSION: 1. Mixed solid and cystic appearing retroperitoneal lesions which may reflect conglomerate nodal mass lesions or metastases some of which may be necrotic. Alternative differential could include retroperitoneal fibrosis which is less favored given the overall masslike appearance and encasement rather than occlusion of the vessels as detailed above. Recommend correlation with patient exam findings and history. 2. Severe right hydroureteronephrosis to the level of the mid ureter where there is likely occlusion by the retroperitoneal lesions above. 3. Moderate left hydronephrosis appears secondary to a distended urinary bladder with large cystocele. No obstructive urolithiasis. 4. Focal nodular enhancement at the 10 o'clock position at the level of the rectum with rectocele may reflect an internal hemorrhoid. Could correlate with exam findings. These results were called by telephone at the time of interpretation on 04/02/2019 at 10:24 pm to provider JULIE IDOL , who verbally acknowledged these results. Electronically Signed   By: Lovena Le M.D.   On: 04/02/2019 22:25    ASSESSMENT & PLAN:  No problem-specific Assessment & Plan notes found for this encounter.  No orders of the defined types were placed in this encounter.   All questions were answered. The patient knows to call the clinic with any problems, questions or concerns. I spent {CHL ONC TIME VISIT - XBMWU:[id number]} counseling the patient face to face. The total time spent in the appointment was {CHL ONC TIME VISIT - OZDGU:[id number]} and more than 50% was on counseling.     Tally Due, LPN 05/18/3873 6:43 PM

## 2019-04-20 NOTE — Assessment & Plan Note (Addendum)
1.  Retroperitoneal mass: -She was seen by me in the hospital on 04/04/2019 when she was admitted for right-sided back pain.  CT scan showed retroperitoneal mass. -Work-up for germ cell tumor showed normal LDH, beta hCG and AFP. -Since she was discharged home, she reported improvement in the pain. -CT of the chest did not show any suspicious masses. -As the retroperitoneal mass is in a difficult area for biopsy, I have recommended a PET CT scan. -PET scan will help [place] identify biopsy site which can be safer. -We will schedule her for PET scan and see her back after that.  2.  Right loin pain: -She was admitted with pain in the right loin radiating to the groin region. -She reported improvement in the pain since discharge.  She has only taking 7 pills of morphine IR 15 mg since discharge.  Last pill was taken 3 days ago. -She is taking extra strength Tylenol for mild pains.

## 2019-04-20 NOTE — Patient Instructions (Addendum)
[place] at [facility] Discharge Instructions  You were seen today by Dr. Delton Coombes. He went over your recent lab results. We will schedule you for a PET scan.  He will see you back in 2-3 weeks, after your PET scan, for labs and follow up.   Thank you for choosing [place] at [facility] to provide your oncology and hematology care.  To afford each patient quality time with our provider, please arrive at least 15 minutes before your scheduled appointment time.   If you have a lab appointment with the [place] please come in thru the  Main Entrance and check in at the main information desk  You need to re-schedule your appointment should you arrive 10 or more minutes late.  We strive to give you quality time with our providers, and arriving late affects you and other patients whose appointments are after yours.  Also, if you no show three or more times for appointments you may be dismissed from the clinic at the providers discretion.     Again, thank you for choosing [facility].  Our hope is that these requests will decrease the amount of time that you wait before being seen by our physicians.       _____________________________________________________________  Should you have questions after your visit to [facility], please contact our office at [phone] between the hours of 8:00 a.m. and 4:30 p.m.  Voicemails left after 4:00 p.m. will not be returned until the following business day.  For prescription refill requests, have your pharmacy contact our office and allow 72 hours.    Cancer Center Support Programs:   > Cancer Support Group  2nd Tuesday of the month 1pm-2pm, Journey Room

## 2019-04-21 ENCOUNTER — Encounter: Payer: Self-pay | Admitting: Internal Medicine

## 2019-04-21 DIAGNOSIS — R350 Frequency of micturition: Secondary | ICD-10-CM

## 2019-05-09 ENCOUNTER — Ambulatory Visit (HOSPITAL_COMMUNITY): Payer: Medicare Other

## 2019-05-09 ENCOUNTER — Other Ambulatory Visit (HOSPITAL_COMMUNITY): Payer: Self-pay | Admitting: Hematology

## 2019-05-09 MED ORDER — ALPRAZOLAM 0.5 MG PO TABS: 0.5000 mg | ORAL_TABLET | Freq: Every evening | ORAL | 0 refills | Status: DC | PRN

## 2019-05-11 ENCOUNTER — Ambulatory Visit (HOSPITAL_COMMUNITY): Payer: Medicare Other | Admitting: Hematology

## 2019-05-16 ENCOUNTER — Other Ambulatory Visit (HOSPITAL_COMMUNITY): Payer: Self-pay | Admitting: *Deleted

## 2019-05-17 ENCOUNTER — Ambulatory Visit (HOSPITAL_COMMUNITY)
Admission: RE | Admit: 2019-05-17 | Discharge: 2019-05-17 | Disposition: A | Discharge status: Home / Self Care | Payer: Medicare Other | Source: Ambulatory Visit | Attending: Hematology | Admitting: Hematology

## 2019-05-17 ENCOUNTER — Encounter (HOSPITAL_COMMUNITY): Payer: Medicare Other

## 2019-05-17 ENCOUNTER — Other Ambulatory Visit: Payer: Self-pay

## 2019-05-17 DIAGNOSIS — N8110 Cystocele, unspecified: Secondary | ICD-10-CM | POA: Insufficient documentation

## 2019-05-17 DIAGNOSIS — I2510 Atherosclerotic heart disease of native coronary artery without angina pectoris: Secondary | ICD-10-CM | POA: Diagnosis not present

## 2019-05-17 DIAGNOSIS — I700 Atherosclerosis of aorta: Secondary | ICD-10-CM | POA: Diagnosis not present

## 2019-05-17 DIAGNOSIS — R1900 Intra-abdominal and pelvic swelling, mass and lump, unspecified site: Secondary | ICD-10-CM | POA: Diagnosis not present

## 2019-05-17 DIAGNOSIS — R1909 Other intra-abdominal and pelvic swelling, mass and lump: Secondary | ICD-10-CM | POA: Diagnosis not present

## 2019-05-17 DIAGNOSIS — K449 Diaphragmatic hernia without obstruction or gangrene: Secondary | ICD-10-CM | POA: Insufficient documentation

## 2019-05-17 LAB — GLUCOSE, CAPILLARY: Glucose-Capillary: 124 mg/dL — ABNORMAL HIGH (ref 70–99)

## 2019-05-17 MED ORDER — FLUDEOXYGLUCOSE F - 18 (FDG) INJECTION
10.4000 | Freq: Once | INTRAVENOUS | Status: AC | PRN
  Administered 2019-05-17: 10.4 via INTRAVENOUS

## 2019-05-18 ENCOUNTER — Ambulatory Visit (HOSPITAL_COMMUNITY): Payer: Medicare Other | Admitting: Hematology

## 2019-05-19 ENCOUNTER — Other Ambulatory Visit: Payer: Self-pay

## 2019-05-19 ENCOUNTER — Other Ambulatory Visit (HOSPITAL_COMMUNITY): Payer: Medicare Other

## 2019-05-19 ENCOUNTER — Inpatient Hospital Stay (HOSPITAL_COMMUNITY): Payer: Medicare Other | Attending: Hematology | Admitting: Hematology

## 2019-05-19 ENCOUNTER — Encounter: Payer: Self-pay | Admitting: Internal Medicine

## 2019-05-19 VITALS — BP 152/66 | HR 82 | Temp 97.1°F | Resp 16 | Wt 193.0 lb

## 2019-05-19 DIAGNOSIS — Z833 Family history of diabetes mellitus: Secondary | ICD-10-CM | POA: Diagnosis not present

## 2019-05-19 DIAGNOSIS — Z8249 Family history of ischemic heart disease and other diseases of the circulatory system: Secondary | ICD-10-CM | POA: Insufficient documentation

## 2019-05-19 DIAGNOSIS — R1900 Intra-abdominal and pelvic swelling, mass and lump, unspecified site: Secondary | ICD-10-CM | POA: Diagnosis not present

## 2019-05-19 DIAGNOSIS — Z7984 Long term (current) use of oral hypoglycemic drugs: Secondary | ICD-10-CM | POA: Insufficient documentation

## 2019-05-19 DIAGNOSIS — G4730 Sleep apnea, unspecified: Secondary | ICD-10-CM | POA: Diagnosis not present

## 2019-05-19 DIAGNOSIS — K219 Gastro-esophageal reflux disease without esophagitis: Secondary | ICD-10-CM | POA: Insufficient documentation

## 2019-05-19 DIAGNOSIS — F419 Anxiety disorder, unspecified: Secondary | ICD-10-CM | POA: Insufficient documentation

## 2019-05-19 DIAGNOSIS — E119 Type 2 diabetes mellitus without complications: Secondary | ICD-10-CM | POA: Diagnosis not present

## 2019-05-19 DIAGNOSIS — Z79899 Other long term (current) drug therapy: Secondary | ICD-10-CM | POA: Diagnosis not present

## 2019-05-19 DIAGNOSIS — I10 Essential (primary) hypertension: Secondary | ICD-10-CM | POA: Insufficient documentation

## 2019-05-19 DIAGNOSIS — R1909 Other intra-abdominal and pelvic swelling, mass and lump: Secondary | ICD-10-CM | POA: Diagnosis not present

## 2019-05-19 DIAGNOSIS — E785 Hyperlipidemia, unspecified: Secondary | ICD-10-CM | POA: Insufficient documentation

## 2019-05-19 DIAGNOSIS — K5909 Other constipation: Secondary | ICD-10-CM | POA: Insufficient documentation

## 2019-05-19 NOTE — Assessment & Plan Note (Signed)
1.  PET positive retroperitoneal mass: -CT of the abdomen and pelvis showed retroperitoneal mass.  CT chest did not show suspicious mass. -Work-up for germ cell tumor showed normal LDH, beta-2 hCG and AFP. -We reviewed PET scan from 05/17/2019 with her.  Solid retroperitoneal mass anterior to the aortic bifurcation with SUV 19.5.  Mass measures 6 x 5.8 cm and is partially calcified.  Separate solid component superior to this in the left periaortic region measuring 3.5 cm.  Also hypermetabolic SUV [id number].  Cystic component medial to the lower pole of the left kidney is without hypermetabolic activity, possibly a lymphocele. -I have recommended biopsy by interventional radiology. -I will see her back after the biopsy.  2.  Low back pain: -She reports low back pain at nighttime and cannot sleep.  This pain sometimes radiates to sides. -I have told her to take morphine IR 15 mg as needed.  She has 10 more pills.  She will call us once she runs out of the pills.

## 2019-05-19 NOTE — Patient Instructions (Addendum)
[place] at [facility] Discharge Instructions  You were seen today by Dr. Delton Coombes. He went over your recent scan results, your scan shows that you have some type of cancer. We need to further evaluate this to see what type of cancer it is. He will schedule you for a biopsy in [place]. He will see you back after your biopsy for follow up.   Thank you for choosing [place] at [facility] to provide your oncology and hematology care.  To afford each patient quality time with our provider, please arrive at least 15 minutes before your scheduled appointment time.   If you have a lab appointment with the [place] please come in thru the  Main Entrance and check in at the main information desk  You need to re-schedule your appointment should you arrive 10 or more minutes late.  We strive to give you quality time with our providers, and arriving late affects you and other patients whose appointments are after yours.  Also, if you no show three or more times for appointments you may be dismissed from the clinic at the providers discretion.     Again, thank you for choosing [facility].  Our hope is that these requests will decrease the amount of time that you wait before being seen by our physicians.       _____________________________________________________________  Should you have questions after your visit to [facility], please contact our office at [phone] between the hours of 8:00 a.m. and 4:30 p.m.  Voicemails left after 4:00 p.m. will not be returned until the following business day.  For prescription refill requests, have your pharmacy contact our office and allow 72 hours.    Cancer Center Support Programs:   > Cancer Support Group  2nd Tuesday of the month 1pm-2pm, Journey Room

## 2019-05-19 NOTE — Progress Notes (Signed)
[address] [place], [place] [address]   CLINIC:  Medical Oncology/Hematology  PCP:  Biagio Borg, MD [place] [id number] [phone]   REASON FOR VISIT:  Follow-up for retroperitoneal masses.  CURRENT THERAPY: Under work-up.   INTERVAL HISTORY:  Laura [name] 74 y.o. female seen for follow-up of retroperitoneal mass.  Reports low back pain at nighttime and cannot sleep.  Pain is reported as aching in type.  She is taking Tylenol which is not helping.  Appetite is 100%.  Energy levels are 50%.  Mild fatigue reported.  Chronic constipation is also stable.    REVIEW OF SYSTEMS:  Review of Systems  Constitutional: Positive for fatigue.  Gastrointestinal: Positive for constipation.  Musculoskeletal: Positive for back pain.  Psychiatric/Behavioral: Positive for sleep disturbance.  All other systems reviewed and are negative.    PAST MEDICAL/SURGICAL HISTORY:  Past Medical History:  Diagnosis Date  . Anxiety   . Arthritis    back of neck, bones spurs on neck  . Cataract   . Cervical disc disease   . Diabetes mellitus   . Hyperlipidemia   . Hypertension   . Mucoid cyst of joint    right thumb  . Neuropathy   . Reflux   . Sleep apnea    wears CPAP nightly  . Vertigo    Past Surgical History:  Procedure Laterality Date  . BLADDER SURGERY    . BREAST EXCISIONAL BIOPSY Bilateral   . BREAST SURGERY    . CHOLECYSTECTOMY    . fibroids removed     breast (both breasts)  . MASS EXCISION Right 06/26/2016   Procedure: EXCISION MUCOID TUMOR RIGHT THUMB, IP RIGHT THUMB;  Surgeon: Daryll Brod, MD;  Location: [facility];  Service: Orthopedics;  Laterality: Right;  . PARTIAL HYSTERECTOMY       SOCIAL HISTORY:  Social History   Socioeconomic History  . Marital status: Divorced    Spouse name: Not on file  . Number of children: 4  . Years of education: 72  . Highest education level: Not on file    Occupational History  . Occupation: retired Geographical information systems officer  Tobacco Use  . Smoking status: Never Smoker  . Smokeless tobacco: Never Used  Substance and Sexual Activity  . Alcohol use: No    Alcohol/week: 0.0 standard drinks  . Drug use: No  . Sexual activity: Not Currently  Other Topics Concern  . Not on file  Social History Narrative  . Not on file   Social Determinants of Health   Financial Resource Strain: Low Risk   . Difficulty of Paying Living Expenses: Not hard at all  Food Insecurity: No Food Insecurity  . Worried About Charity fundraiser in the Last Year: Never true  . Ran Out of Food in the Last Year: Never true  Transportation Needs: No Transportation Needs  . Lack of Transportation (Medical): No  . Lack of Transportation (Non-Medical): No  Physical Activity: Inactive  . Days of Exercise per Week: 0 days  . Minutes of Exercise per Session: 0 min  Stress: No Stress Concern Present  . Feeling of Stress : Not at all  Social Connections: Slightly Isolated  . Frequency of Communication with Friends and Family: More than three times a week  . Frequency of Social Gatherings with Friends and Family: More than three times a week  . Attends Religious Services: More than 4 times per year  .  Active Member of Clubs or Organizations: Yes  . Attends Archivist Meetings: Never  . Marital Status: Divorced  Human resources officer Violence: Not At Risk  . Fear of Current or Ex-Partner: No  . Emotionally Abused: No  . Physically Abused: No  . Sexually Abused: No    FAMILY HISTORY:  Family History  Problem Relation Age of Onset  . Hypertension Other   . Diabetes Father   . Hypertension Father   . Diabetes Sister   . Hypertension Mother   . Hypertension Sister   . Stroke Sister   . Diabetes Maternal Aunt   . Colon cancer Neg Hx   . Esophageal cancer Neg Hx   . Stomach cancer Neg Hx   . Rectal cancer Neg Hx     CURRENT MEDICATIONS:  Outpatient  Encounter Medications as of 05/19/2019  Medication Sig  . amLODipine (NORVASC) 5 MG tablet TAKE ONE (1) TABLET BY MOUTH EVERY DAY (Patient taking differently: Take 5 mg by mouth daily. )  . Blood Glucose Monitoring Suppl (ONE TOUCH ULTRA 2) w/Device KIT Use as directed  . Cholecalciferol (VITAMIN D3) 50 MCG (2000 UT) TABS Take by mouth.  . Cyanocobalamin (VITAMIN B-12 PO) Take by mouth.  Marland Kitchen glimepiride (AMARYL) 2 MG tablet TAKE ONETABLET BY MOUTH DAILY  . Lancets (ONETOUCH ULTRASOFT) lancets USE TO CHECK BLOOD SUGAR TWICE DAILY  . ONETOUCH ULTRA test strip USE TO CHECK BLOOD SUGARS TWO TIMES DAILY  . acetaminophen (TYLENOL) 325 MG tablet Take 325 mg by mouth as needed for mild pain, moderate pain, fever or headache.   . ALPRAZolam (XANAX) 0.5 MG tablet Take 1 tablet (0.5 mg total) by mouth at bedtime as needed for anxiety. Take 1 tablet 1 hour prior to the PET scan.  May take another tablet immediately prior to the PET scan if the initial tablet did not work. (Patient not taking: Reported on 05/19/2019)  . clindamycin (CLEOCIN) 300 MG capsule Take 1 capsule (300 mg total) by mouth 3 (three) times daily.  . fluconazole (DIFLUCAN) 150 MG tablet 1 tab by mouth every 3 days as needed (Patient not taking: Reported on 04/20/2019)  . LORazepam (ATIVAN) 0.5 MG tablet TAKE 1 TABLET BY MOUTH 2 TIMES DAILY AS NEEDED FOR ANXIETY (Patient not taking: No sig reported)  . pantoprazole (PROTONIX) 40 MG tablet TAKE 1 TABLET (40 MG TOTAL) BY MOUTH DAILY. (Patient not taking: Reported on 05/19/2019)  . solifenacin (VESICARE) 5 MG tablet Take 5 mg by mouth as needed.    No facility-administered encounter medications on file as of 05/19/2019.    ALLERGIES:  Allergies  Allergen Reactions  . Hydrocodone-Homatropine Other (See Comments)    Vertigo *pt strongly prefers to never take*  . Sulfa Antibiotics     Tongue swells, hives, itching  . Augmentin [Amoxicillin-Pot Clavulanate]     Diarrhea; can take PCN/ Amoxicillin  .  Codeine     itch  . Crestor [Rosuvastatin Calcium] Other (See Comments)    Did something to memory   . Doxycycline   . Keflex [Cephalexin] Diarrhea and Nausea And Vomiting  . Lipitor [Atorvastatin Calcium] Other (See Comments)    Makes weak   . Naproxen Other (See Comments)    Stomach cramps  . Prednisone Other (See Comments)    Nervous *pt strongly prefers to never be given prednisone*      PHYSICAL EXAM:  ECOG Performance status: 1  Vitals:   05/19/19 1134 05/19/19 1136  BP: (!) 160/71 (!) 152/66  Pulse: 82   Resp: 16   Temp: (!) 97.1 F (36.2 C)   SpO2: 98%    Filed Weights   05/19/19 1134  Weight: 193 lb (87.5 kg)    Physical Exam Vitals reviewed.  Constitutional:      Appearance: Normal appearance.  Cardiovascular:     Rate and Rhythm: Normal rate and regular rhythm.     Heart sounds: Normal heart sounds.  Pulmonary:     Effort: Pulmonary effort is normal.     Breath sounds: Normal breath sounds.  Abdominal:     General: There is no distension.     Palpations: Abdomen is soft. There is no mass.  Lymphadenopathy:     Cervical: No cervical adenopathy.  Skin:    General: Skin is warm.  Neurological:     General: No focal deficit present.     Mental Status: She is alert and oriented to person, place, and time.  Psychiatric:        Mood and Affect: Mood normal.        Behavior: Behavior normal.      LABORATORY DATA:  I have reviewed the labs as listed.  CBC    Component Value Date/Time   WBC 8.8 04/05/2019 0752   RBC 4.15 04/05/2019 0752   HGB 12.0 04/05/2019 0752   HCT 37.2 04/05/2019 0752   PLT 248 04/05/2019 0752   MCV 89.6 04/05/2019 0752   MCH 28.9 04/05/2019 0752   MCHC 32.3 04/05/2019 0752   RDW 12.9 04/05/2019 0752   LYMPHSABS 2.0 04/05/2019 0752   MONOABS 0.6 04/05/2019 0752   EOSABS 0.1 04/05/2019 0752   BASOSABS 0.0 04/05/2019 0752   CMP Latest Ref Rng & Units 04/03/2019 04/02/2019 08/25/2018  Glucose 70 - 99 mg/dL 180(H)  186(H) 111(H)  BUN 8 - 23 mg/dL 14 13 13   Creatinine 0.44 - 1.00 mg/dL 0.82 0.69 0.90  Sodium 135 - 145 mmol/L 136 136 140  Potassium 3.5 - 5.1 mmol/L 3.6 3.9 4.1  Chloride 98 - 111 mmol/L 102 101 106  CO2 22 - 32 mmol/L 24 23 25   Calcium 8.9 - 10.3 mg/dL 9.7 9.8 10.2  Total Protein 6.5 - 8.1 g/dL 7.8 8.1 7.7  Total Bilirubin 0.3 - 1.2 mg/dL 0.5 0.5 0.3  Alkaline Phos 38 - 126 U/L 75 82 81  AST 15 - 41 U/L 18 16 16   ALT 0 - 44 U/L 14 15 16        DIAGNOSTIC IMAGING:  I have reviewed PET scan images with her.    ASSESSMENT & PLAN:   Retroperitoneal mass 1.  PET positive retroperitoneal mass: -CT of the abdomen and pelvis showed retroperitoneal mass.  CT chest did not show suspicious mass. -Work-up for germ cell tumor showed normal LDH, beta-2 hCG and AFP. -We reviewed PET scan from 05/17/2019 with her.  Solid retroperitoneal mass anterior to the aortic bifurcation with SUV 19.5.  Mass measures 6 x 5.8 cm and is partially calcified.  Separate solid component superior to this in the left periaortic region measuring 3.5 cm.  Also hypermetabolic SUV 65.9.  Cystic component medial to the lower pole of the left kidney is without hypermetabolic activity, possibly a lymphocele. -I have recommended biopsy by interventional radiology. -I will see her back after the biopsy.  2.  Low back pain: -She reports low back pain at nighttime and cannot sleep.  This pain sometimes radiates to sides. -I have told her to take morphine IR 15 mg as  needed.  She has 10 more pills.  She will call us once she runs out of the pills.     Orders placed this encounter:  Orders Placed This Encounter  Procedures  . CT Biopsy      Derek Jack, MD [place] [phone]

## 2019-05-23 ENCOUNTER — Encounter (HOSPITAL_COMMUNITY): Payer: Self-pay

## 2019-05-23 NOTE — Progress Notes (Unsigned)
[name] Female, 74 y.o., 01-26-1945 MRN:  [id number] Phone:  [phone] Jerilynn Mages) PCP:  Biagio Borg, MD Primary Cvg:  [facility] [employer] Next Appt With Radiology ([facility]-CT 1) 06/06/2019 at 11:00 AM  RE: Biopsy Received: 2 days ago Buchanan, Daniel, MD  Lenore Cordia      Ok   CT core L paraaortic LAN (NOT lymphocele)  See CT [id number]   DDH   Previous Messages  ----- Message -----  From: Lenore Cordia  Sent: 05/20/2019  4:35 PM EDT  To: Ir Procedure Requests  Subject: Biopsy                      Procedure Requested: CT Biopsy    Reason for Procedure: retroperitoneal mass    Provider Requesting: Derek Jack  Provider Telephone: [phone]    Other Info: Rad exams in Epic

## 2019-06-01 ENCOUNTER — Encounter (HOSPITAL_COMMUNITY): Payer: Self-pay

## 2019-06-01 ENCOUNTER — Other Ambulatory Visit (HOSPITAL_COMMUNITY): Payer: Self-pay | Admitting: *Deleted

## 2019-06-01 ENCOUNTER — Other Ambulatory Visit (HOSPITAL_COMMUNITY): Payer: Self-pay | Admitting: Nurse Practitioner

## 2019-06-01 DIAGNOSIS — R1900 Intra-abdominal and pelvic swelling, mass and lump, unspecified site: Secondary | ICD-10-CM

## 2019-06-01 MED ORDER — MORPHINE SULFATE 15 MG PO TABS: 15.0000 mg | ORAL_TABLET | Freq: Two times a day (BID) | ORAL | 0 refills | Status: DC | PRN

## 2019-06-03 ENCOUNTER — Other Ambulatory Visit: Payer: Self-pay | Admitting: Radiology

## 2019-06-04 ENCOUNTER — Encounter (HOSPITAL_COMMUNITY): Payer: Self-pay

## 2019-06-06 ENCOUNTER — Other Ambulatory Visit: Payer: Self-pay

## 2019-06-06 ENCOUNTER — Ambulatory Visit (HOSPITAL_COMMUNITY)
Admission: RE | Admit: 2019-06-06 | Discharge: 2019-06-06 | Disposition: A | Discharge status: Home / Self Care | Payer: Medicare Other | Source: Ambulatory Visit | Attending: Hematology | Admitting: Hematology

## 2019-06-06 ENCOUNTER — Encounter (HOSPITAL_COMMUNITY): Payer: Self-pay

## 2019-06-06 DIAGNOSIS — N1330 Unspecified hydronephrosis: Secondary | ICD-10-CM | POA: Insufficient documentation

## 2019-06-06 DIAGNOSIS — R590 Localized enlarged lymph nodes: Secondary | ICD-10-CM | POA: Insufficient documentation

## 2019-06-06 DIAGNOSIS — Z7901 Long term (current) use of anticoagulants: Secondary | ICD-10-CM | POA: Diagnosis not present

## 2019-06-06 DIAGNOSIS — C801 Malignant (primary) neoplasm, unspecified: Secondary | ICD-10-CM | POA: Diagnosis not present

## 2019-06-06 DIAGNOSIS — E118 Type 2 diabetes mellitus with unspecified complications: Secondary | ICD-10-CM | POA: Diagnosis not present

## 2019-06-06 DIAGNOSIS — R1900 Intra-abdominal and pelvic swelling, mass and lump, unspecified site: Secondary | ICD-10-CM | POA: Diagnosis not present

## 2019-06-06 DIAGNOSIS — G4730 Sleep apnea, unspecified: Secondary | ICD-10-CM | POA: Diagnosis not present

## 2019-06-06 DIAGNOSIS — Z79899 Other long term (current) drug therapy: Secondary | ICD-10-CM | POA: Insufficient documentation

## 2019-06-06 DIAGNOSIS — E785 Hyperlipidemia, unspecified: Secondary | ICD-10-CM | POA: Insufficient documentation

## 2019-06-06 DIAGNOSIS — I10 Essential (primary) hypertension: Secondary | ICD-10-CM | POA: Insufficient documentation

## 2019-06-06 DIAGNOSIS — C772 Secondary and unspecified malignant neoplasm of intra-abdominal lymph nodes: Secondary | ICD-10-CM | POA: Diagnosis not present

## 2019-06-06 LAB — CBC WITH DIFFERENTIAL/PLATELET
Abs Immature Granulocytes: 0.03 10*3/uL (ref 0.00–0.07)
Basophils Absolute: 0 10*3/uL (ref 0.0–0.1)
Basophils Relative: 1 %
Eosinophils Absolute: 0.2 10*3/uL (ref 0.0–0.5)
Eosinophils Relative: 2 %
HCT: 38 % (ref 36.0–46.0)
Hemoglobin: 12.5 g/dL (ref 12.0–15.0)
Immature Granulocytes: 0 %
Lymphocytes Relative: 31 %
Lymphs Abs: 2.5 10*3/uL (ref 0.7–4.0)
MCH: 28.8 pg (ref 26.0–34.0)
MCHC: 32.9 g/dL (ref 30.0–36.0)
MCV: 87.6 fL (ref 80.0–100.0)
Monocytes Absolute: 0.6 10*3/uL (ref 0.1–1.0)
Monocytes Relative: 7 %
Neutro Abs: 4.9 10*3/uL (ref 1.7–7.7)
Neutrophils Relative %: 59 %
Platelets: 256 10*3/uL (ref 150–400)
RBC: 4.34 MIL/uL (ref 3.87–5.11)
RDW: 13.2 % (ref 11.5–15.5)
WBC: 8.2 10*3/uL (ref 4.0–10.5)
nRBC: 0 % (ref 0.0–0.2)

## 2019-06-06 LAB — PROTIME-INR
INR: 1 (ref 0.8–1.2)
Prothrombin Time: 12.5 seconds (ref 11.4–15.2)

## 2019-06-06 LAB — BASIC METABOLIC PANEL
Anion gap: 11 (ref 5–15)
BUN: 13 mg/dL (ref 8–23)
CO2: 22 mmol/L (ref 22–32)
Calcium: 9.5 mg/dL (ref 8.9–10.3)
Chloride: 108 mmol/L (ref 98–111)
Creatinine, Ser: 0.79 mg/dL (ref 0.44–1.00)
GFR calc Af Amer: >60 mL/min (ref >60)
GFR calc non Af Amer: >60 mL/min (ref >60)
Glucose, Bld: 166 mg/dL — ABNORMAL HIGH (ref 70–99)
Potassium: 4.2 mmol/L (ref 3.5–5.1)
Sodium: 141 mmol/L (ref 135–145)

## 2019-06-06 LAB — GLUCOSE, CAPILLARY: Glucose-Capillary: 160 mg/dL — ABNORMAL HIGH (ref 70–99)

## 2019-06-06 MED ORDER — LIDOCAINE HCL (PF) 1 % IJ SOLN
INTRAMUSCULAR | Status: AC | PRN
  Administered 2019-06-06 (×2): 10 mL via INTRADERMAL

## 2019-06-06 MED ORDER — MIDAZOLAM HCL 2 MG/2ML IJ SOLN
INTRAMUSCULAR | Status: AC
  Filled 2019-06-06: qty 2

## 2019-06-06 MED ORDER — FENTANYL CITRATE (PF) 100 MCG/2ML IJ SOLN
INTRAMUSCULAR | Status: AC
  Filled 2019-06-06: qty 2

## 2019-06-06 MED ORDER — FENTANYL CITRATE (PF) 100 MCG/2ML IJ SOLN
INTRAMUSCULAR | Status: AC | PRN
  Administered 2019-06-06: 50 ug via INTRAVENOUS
  Administered 2019-06-06 (×2): 25 ug via INTRAVENOUS

## 2019-06-06 MED ORDER — FLUMAZENIL 0.5 MG/5ML IV SOLN
INTRAVENOUS | Status: AC
  Filled 2019-06-06: qty 5

## 2019-06-06 MED ORDER — NALOXONE HCL 0.4 MG/ML IJ SOLN
INTRAMUSCULAR | Status: AC
  Filled 2019-06-06: qty 1

## 2019-06-06 MED ORDER — SODIUM CHLORIDE 0.9 % IV SOLN: INTRAVENOUS | Status: DC

## 2019-06-06 MED ORDER — MIDAZOLAM HCL 2 MG/2ML IJ SOLN
INTRAMUSCULAR | Status: AC | PRN
  Administered 2019-06-06 (×3): 0.5 mg via INTRAVENOUS
  Administered 2019-06-06: 1 mg via INTRAVENOUS
  Administered 2019-06-06: 0.5 mg via INTRAVENOUS

## 2019-06-06 NOTE — Procedures (Signed)
Interventional Radiology Procedure Note  Procedure: CT Guided Biopsy of right retroperitoneal lymph node mass  Complications: None  Estimated Blood Loss: < 10 mL  Findings: 28 G core biopsy of right RP LN performed under CT guidance.  Three core samples obtained and sent to Pathology.  Venetia Night. Kathlene Cote, M.D Pager:  [phone]

## 2019-06-06 NOTE — Consult Note (Signed)
Chief Complaint: Patient was seen in consultation today for CT guided biopsy of left para aortic lymph node  Referring Physician(s): Am[place]  Supervising Physician: Aletta Edouard  Patient Status: [facility] - Out-pt  History of Present Illness: Laura [name] is a 74 y.o. female with hx flank/ abdominal pain/constipation and recent imaging studies revealing:  1. Mixed solid and cystic appearing retroperitoneal lesions which may reflect conglomerate nodal mass lesions or metastases some of which may be necrotic. Alternative differential could include retroperitoneal fibrosis which is less favored given the overall masslike appearance and encasement rather than occlusion of the vessels as detailed above. Recommend correlation with patient exam findings and history. 2. Severe right hydroureteronephrosis to the level of the mid ureter where there is likely occlusion by the retroperitoneal lesions above. 3. Moderate left hydronephrosis appears secondary to a distended urinary bladder with large cystocele. No obstructive urolithiasis. 4. Focal nodular enhancement at the 10 o'clock position at the level of the rectum with rectocele may reflect an internal hemorrhoid   The RP lesions are hypermetabolic on PET. She has no prior hx of malignancy and presents today for CT guided biopsy of a left para aortic LN for further evaluation.   Past Medical History:  Diagnosis Date  . Anxiety   . Arthritis    back of neck, bones spurs on neck  . Cataract   . Cervical disc disease   . Diabetes mellitus   . Hyperlipidemia   . Hypertension   . Mucoid cyst of joint    right thumb  . Neuropathy   . Reflux   . Sleep apnea    wears CPAP nightly  . Vertigo     Past Surgical History:  Procedure Laterality Date  . BLADDER SURGERY    . BREAST EXCISIONAL BIOPSY Bilateral   . BREAST SURGERY    . CHOLECYSTECTOMY    . fibroids removed     breast (both breasts)  . MASS EXCISION Right  06/26/2016   Procedure: EXCISION MUCOID TUMOR RIGHT THUMB, IP RIGHT THUMB;  Surgeon: Daryll Brod, MD;  Location: [facility];  Service: Orthopedics;  Laterality: Right;  . PARTIAL HYSTERECTOMY      Allergies: Hydrocodone-homatropine, Sulfa antibiotics, Augmentin [amoxicillin-pot clavulanate], Codeine, Crestor [rosuvastatin calcium], Doxycycline, Keflex [cephalexin], Lipitor [atorvastatin calcium], Naproxen, and Prednisone  Medications: Prior to Admission medications   Medication Sig Start Date End Date Taking? Authorizing Provider  acetaminophen (TYLENOL) 325 MG tablet Take 325 mg by mouth as needed for mild pain, moderate pain, fever or headache.    Yes [provider]  amLODipine (NORVASC) 5 MG tablet TAKE ONE (1) TABLET BY MOUTH EVERY DAY Patient taking differently: Take 5 mg by mouth daily.  02/23/19  Yes Biagio Borg, MD  Cholecalciferol (VITAMIN D3) 50 MCG (2000 UT) TABS Take by mouth.   Yes [provider]  Cyanocobalamin (VITAMIN B-12 PO) Take by mouth.   Yes [provider]  glimepiride (AMARYL) 2 MG tablet TAKE ONETABLET BY MOUTH DAILY 08/25/18  Yes Biagio Borg, MD  Lancets [facility] ULTRASOFT) lancets USE TO CHECK BLOOD SUGAR TWICE DAILY 06/14/18  Yes Biagio Borg, MD  morphine (MSIR) 15 MG tablet Take 1 tablet (15 mg total) by mouth 2 (two) times daily as needed for severe pain. 06/01/19  Yes Lockamy, Randi L, NP-C  ALPRAZolam (XANAX) 0.5 MG tablet Take 1 tablet (0.5 mg total) by mouth at bedtime as needed for anxiety. Take 1 tablet 1 hour prior to the  PET scan.  May take another tablet immediately prior to the PET scan if the initial tablet did not work. Patient not taking: Reported on 05/19/2019 05/09/19   Derek Jack, MD  Blood Glucose Monitoring Suppl (ONE TOUCH ULTRA 2) w/Device KIT Use as directed 04/06/15   Biagio Borg, MD  clindamycin (CLEOCIN) 300 MG capsule Take 1 capsule (300 mg total) by mouth 3 (three) times daily. 04/14/19    Biagio Borg, MD  fluconazole (DIFLUCAN) 150 MG tablet 1 tab by mouth every 3 days as needed Patient not taking: Reported on 04/20/2019 04/14/19   Biagio Borg, MD  LORazepam (ATIVAN) 0.5 MG tablet TAKE 1 TABLET BY MOUTH 2 TIMES DAILY AS NEEDED FOR ANXIETY Patient not taking: No sig reported 10/19/18   Biagio Borg, MD  [facility] ULTRA test strip USE TO CHECK BLOOD SUGARS TWO TIMES DAILY 06/14/18   Biagio Borg, MD  pantoprazole (PROTONIX) 40 MG tablet TAKE 1 TABLET (40 MG TOTAL) BY MOUTH DAILY. Patient not taking: Reported on 05/19/2019 03/21/19 03/20/20  Biagio Borg, MD  solifenacin (VESICARE) 5 MG tablet Take 5 mg by mouth as needed.  03/21/19   [provider]     Family History  Problem Relation Age of Onset  . Hypertension Other   . Diabetes Father   . Hypertension Father   . Diabetes Sister   . Hypertension Mother   . Hypertension Sister   . Stroke Sister   . Diabetes Maternal Aunt   . Colon cancer Neg Hx   . Esophageal cancer Neg Hx   . Stomach cancer Neg Hx   . Rectal cancer Neg Hx     Social History   Socioeconomic History  . Marital status: Divorced    Spouse name: Not on file  . Number of children: 4  . Years of education: 80  . Highest education level: Not on file  Occupational History  . Occupation: retired Geographical information systems officer  Tobacco Use  . Smoking status: Never Smoker  . Smokeless tobacco: Never Used  Substance and Sexual Activity  . Alcohol use: No    Alcohol/week: 0.0 standard drinks  . Drug use: No  . Sexual activity: Not Currently  Other Topics Concern  . Not on file  Social History Narrative  . Not on file   Social Determinants of Health   Financial Resource Strain: Low Risk   . Difficulty of Paying Living Expenses: Not hard at all  Food Insecurity: No Food Insecurity  . Worried About Charity fundraiser in the Last Year: Never true  . Ran Out of Food in the Last Year: Never true  Transportation Needs: No Transportation Needs  . Lack  of Transportation (Medical): No  . Lack of Transportation (Non-Medical): No  Physical Activity: Inactive  . Days of Exercise per Week: 0 days  . Minutes of Exercise per Session: 0 min  Stress: No Stress Concern Present  . Feeling of Stress : Not at all  Social Connections: Slightly Isolated  . Frequency of Communication with Friends and Family: More than three times a week  . Frequency of Social Gatherings with Friends and Family: More than three times a week  . Attends Religious Services: More than 4 times per year  . Active Member of Clubs or Organizations: Yes  . Attends Archivist Meetings: Never  . Marital Status: Divorced      Review of Systems denies fever,HA,chest pain, dyspnea, N/V or bleeding  Vital Signs: BP (!) 150/83   Pulse 78   Temp 97.8 F (36.6 C) (Oral)   Resp 18   SpO2 99%   Physical Exam awake/alert: chest- CTA bilat; heart- RRR; abd- soft,+BS, mild central tenderness to  Palpation; trace pretibial edema  Imaging: NM PET Image Initial (PI) Skull Base To Thigh  Result Date: 05/17/2019 CLINICAL DATA:  Initial treatment strategy for retroperitoneal mass on CT. No given history of malignancy. EXAM: NUCLEAR MEDICINE PET SKULL BASE TO THIGH TECHNIQUE: 10.4 mCi F-18 FDG was injected intravenously. Full-ring PET imaging was performed from the skull base to thigh after the radiotracer. CT data was obtained and used for attenuation correction and anatomic localization. Fasting blood glucose: 125 mg/dl COMPARISON:  CT chest 04/04/2019 and abdominopelvic CT 04/02/2019. FINDINGS: Mediastinal blood pool activity: SUV max 3.2 Liver activity: SUV max 4.8 NECK: No hypermetabolic cervical lymph nodes are identified.There are no lesions of the pharyngeal mucosal space. Incidental CT findings: Bilateral carotid atherosclerosis. CHEST: There are no hypermetabolic mediastinal, hilar or axillary lymph nodes. No pulmonary hypermetabolic activity or suspicious nodularity.  Incidental CT findings: Atherosclerosis of the aorta, great vessels and coronary arteries. Small hiatal hernia. Mild emphysema with scattered pulmonary scarring. ABDOMEN/PELVIS: There is no hypermetabolic activity within the liver, adrenal glands, spleen or pancreas. The dominant solid retroperitoneal mass anterior to the aortic bifurcation demonstrates homogeneous hypermetabolic activity with an SUV max of 19.5. This mass measures up to 6.0 x 5.8 cm on image 134/4 and is partially calcified. There is a separate solid component superior to this in the left periaortic region, measuring 3.5 cm on image 122/4, also hypermetabolic (SUV max 50.2). No other hypermetabolic lesions are identified. Incidental CT findings: The previously demonstrated right-sided hydronephrosis and associated perinephric soft tissue stranding have improved. The low-density retroperitoneal mass medial to the lower pole of the left kidney measures 29 HU, 5.4 x 4.1 cm and shows no hypermetabolic activity. This may reflect a lymphocele. Prominent cystocele again noted. SKELETON: There is no hypermetabolic activity to suggest osseous metastatic disease. Incidental CT findings: none IMPRESSION: 1. The previously demonstrated retroperitoneal masses are hypermetabolic, consistent with malignancy. Differential considerations include lymphoma, sarcoma, and less likely, carcinoid tumor. Tissue sampling recommended. 2. Probable associated partial obstruction of the mid right ureter seen on recent CT appears improved. 3. Cystic component medial to the lower pole of the left kidney is without hypermetabolic activity, possibly a lymphocele. 4. No hypermetabolic activity within the neck or chest. 5. Incidental findings including coronary and aortic atherosclerosis, a small hiatal hernia and a cystocele. Electronically Signed   By: Richardean Sale M.D.   On: 05/17/2019 17:03    Labs:  CBC: Recent Labs    04/03/19 1820 04/04/19 0501 04/05/19 0752  06/06/19 0944  WBC 11.6* 11.8* 8.8 8.2  HGB 12.7 12.0 12.0 12.5  HCT 37.9 37.6 37.2 38.0  PLT 243 234 248 256    COAGS: No results for input(s): INR, APTT in the last 8760 hours.  BMP: Recent Labs    08/25/18 1551 04/02/19 2100 04/03/19 1820  NA 140 136 136  K 4.1 3.9 3.6  CL 106 101 102  CO2 _0 GLUCOSE 111* 186* 180*  BUN _1 CALCIUM 10.2 9.8 9.7  CREATININE 0.90 0.69 0.82  GFRNONAA  --  >60 >60  GFRAA  --  >60 >60    LIVER FUNCTION TESTS: Recent Labs    08/25/18 1551 04/02/19 2100 04/03/19 1820  BILITOT 0.3 0.5 0.5  AST _0 ALT _1 ALKPHOS 81 82 75  PROT 7.7 8.1 7.8  ALBUMIN 4.8 4.8 4.4    TUMOR MARKERS: No results for input(s): AFPTM, CEA, CA199, CHROMGRNA in the last 8760 hours.  Assessment and Plan: 74 y.o. female with hx flank/ abdominal pain/constipation and recent imaging studies revealing:  1. Mixed solid and cystic appearing retroperitoneal lesions which may reflect conglomerate nodal mass lesions or metastases some of which may be necrotic. Alternative differential could include retroperitoneal fibrosis which is less favored given the overall masslike appearance and encasement rather than occlusion of the vessels as detailed above. Recommend correlation with patient exam findings and history. 2. Severe right hydroureteronephrosis to the level of the mid ureter where there is likely occlusion by the retroperitoneal lesions above. 3. Moderate left hydronephrosis appears secondary to a distended urinary bladder with large cystocele. No obstructive urolithiasis. 4. Focal nodular enhancement at the 10 o'clock position at the level of the rectum with rectocele may reflect an internal hemorrhoid   The RP lesions are hypermetabolic on PET. She has no prior hx of malignancy and presents today for CT guided biopsy of a left para aortic LN for further evaluation. Risks and benefits of procedure was discussed with the patient   including, but not limited to bleeding, infection, damage to adjacent structures or low yield requiring additional tests.  All of the questions were answered and there is agreement to proceed.  Consent signed and in chart.     Thank you for this interesting consult.  I greatly enjoyed meeting SATARA VIRELLA and look forward to participating in their care.  A copy of this report was sent to the requesting provider on this date.  Electronically Signed: D. Rowe Robert, PA-C 06/06/2019, 10:04 AM   I spent a total of 25 minutes    in face to face in clinical consultation, greater than 50% of which was counseling/coordinating care for CT guided left para aortic lymph node biopsy

## 2019-06-06 NOTE — Discharge Instructions (Addendum)
For any questions or concerns call [phone]; for after hours call [phone] and ask for on call MD   Biopsy Discharge Instructions  The procedure you just had is called a biopsy.  You may feel some discomfort after the local anesthetic wears off.  Your discomfort should improve over the next several days.  AFTER YOUR BIOPSY  Rest for the remainder of the day.  Avoid heavy lifting (more than 10 lb/4.5 kg).  If you have been given a general anesthetic or other medications to help you relax, you should not operate machinery, drive or make legal decisions for 24 hours after your procedure.  Additionally, someone must be available to drive you home.  Only take over-the-counter or prescription medicines for pain, discomfort, or fever as directed by your caregiver.  This can make bleeding worse.  You may resume your usual diet after the procedure.  Avoid alcoholic beverages for 24 hours after your procedure.  Keep the skin around your biopsy site clean and dry.  You may shower after 24 hours.  Cleanse and dry the biopsy site completely after you shower.  Avoid baths and swimming for 72 hours.  Complications are very uncommon after this procedure.  Go to the nearest Emergency Department or contact your caregiver if you develop any of the following symptoms:  Worsening pain  Bleeding  Swelling at the biopsy site  Light headedness or dizziness  Shortness of Breath  Fever or chills Redness or increased pain or swelling at the biopsy site   Moderate Conscious Sedation, Adult, Care After These instructions provide you with information about caring for yourself after your procedure. Your health care provider may also give you more specific instructions. Your treatment has been planned according to current medical practices, but problems sometimes occur. Call your health care provider if you have any problems or questions after your procedure. What can I expect after the  procedure? After your procedure, it is common:  To feel sleepy for several hours.  To feel clumsy and have poor balance for several hours.  To have poor judgment for several hours.  To vomit if you eat too soon. Follow these instructions at home: For at least 24 hours after the procedure:   Do not: ? Participate in activities where you could fall or become injured. ? Drive. ? Use heavy machinery. ? Drink alcohol. ? Take sleeping pills or medicines that cause drowsiness. ? Make important decisions or sign legal documents. ? Take care of children on your own.  Rest. Eating and drinking  Follow the diet recommended by your health care provider.  If you vomit: ? Drink water, juice, or soup when you can drink without vomiting. ? Make sure you have little or no nausea before eating solid foods. General instructions  Have a responsible adult stay with you until you are awake and alert.  Take over-the-counter and prescription medicines only as told by your health care provider.  If you smoke, do not smoke without supervision.  Keep all follow-up visits as told by your health care provider. This is important. Contact a health care provider if:  You keep feeling nauseous or you keep vomiting.  You feel light-headed.  You develop a rash.  You have a fever. Get help right away if:  You have trouble breathing. This information is not intended to replace advice given to you by your health care provider. Make sure you discuss any questions you have with your health care provider. Document Revised: 12/12/2016 Document  Reviewed: 04/21/2015 Elsevier Patient Education  [employer].

## 2019-06-06 NOTE — Telephone Encounter (Signed)
I called the patient back, she states that she had her procedure this morning and was worried that her previous surgery would create issues.  She states that they explained everything to her this morning before the procedure and she had her questions answered.  No further needs at this time.

## 2019-06-07 ENCOUNTER — Ambulatory Visit (HOSPITAL_COMMUNITY): Payer: Medicare Other | Admitting: Hematology

## 2019-06-10 LAB — SURGICAL PATHOLOGY

## 2019-06-15 ENCOUNTER — Other Ambulatory Visit: Payer: Self-pay

## 2019-06-15 ENCOUNTER — Encounter (HOSPITAL_COMMUNITY): Payer: Self-pay | Admitting: Hematology

## 2019-06-15 ENCOUNTER — Inpatient Hospital Stay (HOSPITAL_COMMUNITY): Payer: Medicare Other | Attending: Hematology | Admitting: Hematology

## 2019-06-15 VITALS — BP 151/64 | HR 84 | Temp 98.6°F | Resp 18 | Wt 193.0 lb

## 2019-06-15 DIAGNOSIS — C569 Malignant neoplasm of unspecified ovary: Secondary | ICD-10-CM | POA: Insufficient documentation

## 2019-06-15 DIAGNOSIS — R5383 Other fatigue: Secondary | ICD-10-CM | POA: Insufficient documentation

## 2019-06-15 DIAGNOSIS — K5909 Other constipation: Secondary | ICD-10-CM

## 2019-06-15 DIAGNOSIS — R42 Dizziness and giddiness: Secondary | ICD-10-CM | POA: Insufficient documentation

## 2019-06-15 DIAGNOSIS — K589 Irritable bowel syndrome without diarrhea: Secondary | ICD-10-CM | POA: Diagnosis not present

## 2019-06-15 DIAGNOSIS — M545 Low back pain: Secondary | ICD-10-CM | POA: Diagnosis not present

## 2019-06-15 DIAGNOSIS — F419 Anxiety disorder, unspecified: Secondary | ICD-10-CM | POA: Diagnosis not present

## 2019-06-15 DIAGNOSIS — R1909 Other intra-abdominal and pelvic swelling, mass and lump: Secondary | ICD-10-CM | POA: Diagnosis not present

## 2019-06-15 DIAGNOSIS — I10 Essential (primary) hypertension: Secondary | ICD-10-CM | POA: Diagnosis not present

## 2019-06-15 DIAGNOSIS — K219 Gastro-esophageal reflux disease without esophagitis: Secondary | ICD-10-CM | POA: Diagnosis not present

## 2019-06-15 DIAGNOSIS — M1990 Unspecified osteoarthritis, unspecified site: Secondary | ICD-10-CM | POA: Diagnosis not present

## 2019-06-15 DIAGNOSIS — E785 Hyperlipidemia, unspecified: Secondary | ICD-10-CM | POA: Insufficient documentation

## 2019-06-15 DIAGNOSIS — Z79899 Other long term (current) drug therapy: Secondary | ICD-10-CM | POA: Insufficient documentation

## 2019-06-15 DIAGNOSIS — Z7984 Long term (current) use of oral hypoglycemic drugs: Secondary | ICD-10-CM | POA: Insufficient documentation

## 2019-06-15 DIAGNOSIS — E1136 Type 2 diabetes mellitus with diabetic cataract: Secondary | ICD-10-CM | POA: Diagnosis not present

## 2019-06-15 DIAGNOSIS — H538 Other visual disturbances: Secondary | ICD-10-CM | POA: Insufficient documentation

## 2019-06-15 MED ORDER — LACTULOSE 20 GM/30ML PO SOLN: 30.0000 mL | Freq: Every day | ORAL | 3 refills | Status: DC

## 2019-06-15 MED ORDER — LACTULOSE 20 GM/30ML PO SOLN: 30.0000 mL | Freq: Every day | ORAL | 2 refills | Status: DC

## 2019-06-15 NOTE — Progress Notes (Signed)
[name], [place] [address]   CLINIC:  Medical Oncology/Hematology  PCP:  Biagio Borg, MD [address] / [place] [address] [phone]   REASON FOR VISIT:  Follow-up for retroperitoneal masses  CURRENT THERAPY: Referral to Dr. Denman George.  BRIEF ONCOLOGIC HISTORY:  Oncology History   No history exists.    CANCER STAGING: Cancer Staging No matching staging information was found for the patient.  INTERVAL HISTORY:  [name], a 74 y.o. female, returns for routine follow-up of her retroperitoneal masses. Laura [name] was last seen on 05/19/2019.   Overall she reports feeling good. She reports taking pain medication occasionally for intermittent R loin ache. She started taking Metamucil for her constipation due to IBS. The loin pain started back in March when she took milk of magnesia for constipation, and the loin ache has been present intermittently since.   REVIEW OF SYSTEMS:  Review of Systems  Constitutional: Positive for fatigue (Mild). Negative for appetite change.  Eyes: Positive for eye problems (Occasional blurred vision).  Gastrointestinal: Positive for constipation (Chronic).  Neurological: Positive for dizziness.  All other systems reviewed and are negative.   PAST MEDICAL/SURGICAL HISTORY:  Past Medical History:  Diagnosis Date  . Anxiety   . Arthritis    back of neck, bones spurs on neck  . Cataract   . Cervical disc disease   . Diabetes mellitus   . Hyperlipidemia   . Hypertension   . Mucoid cyst of joint    right thumb  . Neuropathy   . Reflux   . Sleep apnea    wears CPAP nightly  . Vertigo    Past Surgical History:  Procedure Laterality Date  . BLADDER SURGERY    . BREAST EXCISIONAL BIOPSY Bilateral   . BREAST SURGERY    . CHOLECYSTECTOMY    . fibroids removed     breast (both breasts)  . MASS EXCISION Right 06/26/2016   Procedure: EXCISION MUCOID TUMOR RIGHT THUMB, IP RIGHT THUMB;   Surgeon: Daryll Brod, MD;  Location: [facility];  Service: Orthopedics;  Laterality: Right;  . PARTIAL HYSTERECTOMY      SOCIAL HISTORY:  Social History   Socioeconomic History  . Marital status: Divorced    Spouse name: Not on file  . Number of children: 4  . Years of education: 90  . Highest education level: Not on file  Occupational History  . Occupation: retired Geographical information systems officer  Tobacco Use  . Smoking status: Never Smoker  . Smokeless tobacco: Never Used  Substance and Sexual Activity  . Alcohol use: No    Alcohol/week: 0.0 standard drinks  . Drug use: No  . Sexual activity: Not Currently  Other Topics Concern  . Not on file  Social History Narrative  . Not on file   Social Determinants of Health   Financial Resource Strain: Low Risk   . Difficulty of Paying Living Expenses: Not hard at all  Food Insecurity: No Food Insecurity  . Worried About Charity fundraiser in the Last Year: Never true  . Ran Out of Food in the Last Year: Never true  Transportation Needs: No Transportation Needs  . Lack of Transportation (Medical): No  . Lack of Transportation (Non-Medical): No  Physical Activity: Inactive  . Days of Exercise per Week: 0 days  . Minutes of Exercise per Session: 0 min  Stress: No Stress Concern Present  . Feeling of Stress :  Not at all  Social Connections: Slightly Isolated  . Frequency of Communication with Friends and Family: More than three times a week  . Frequency of Social Gatherings with Friends and Family: More than three times a week  . Attends Religious Services: More than 4 times per year  . Active Member of Clubs or Organizations: Yes  . Attends Archivist Meetings: Never  . Marital Status: Divorced  Human resources officer Violence: Not At Risk  . Fear of Current or Ex-Partner: No  . Emotionally Abused: No  . Physically Abused: No  . Sexually Abused: No    FAMILY HISTORY:  Family History  Problem Relation Age  of Onset  . Hypertension Other   . Diabetes Father   . Hypertension Father   . Diabetes Sister   . Hypertension Mother   . Hypertension Sister   . Stroke Sister   . Diabetes Maternal Aunt   . Colon cancer Neg Hx   . Esophageal cancer Neg Hx   . Stomach cancer Neg Hx   . Rectal cancer Neg Hx     CURRENT MEDICATIONS:  Current Outpatient Medications  Medication Sig Dispense Refill  . acetaminophen (TYLENOL) 325 MG tablet Take 325 mg by mouth as needed for mild pain, moderate pain, fever or headache.     . ALPRAZolam (XANAX) 0.5 MG tablet Take 1 tablet (0.5 mg total) by mouth at bedtime as needed for anxiety. Take 1 tablet 1 hour prior to the PET scan.  May take another tablet immediately prior to the PET scan if the initial tablet did not work. (Patient not taking: Reported on 05/19/2019) 2 tablet 0  . amLODipine (NORVASC) 5 MG tablet TAKE ONE (1) TABLET BY MOUTH EVERY DAY (Patient taking differently: Take 5 mg by mouth daily. ) 90 tablet 1  . Blood Glucose Monitoring Suppl (ONE TOUCH ULTRA 2) w/Device KIT Use as directed 1 each 0  . Cholecalciferol (VITAMIN D3) 50 MCG (2000 UT) TABS Take by mouth.    . clindamycin (CLEOCIN) 300 MG capsule Take 1 capsule (300 mg total) by mouth 3 (three) times daily. 21 capsule 0  . Cyanocobalamin (VITAMIN B-12 PO) Take by mouth.    . fluconazole (DIFLUCAN) 150 MG tablet 1 tab by mouth every 3 days as needed (Patient not taking: Reported on 04/20/2019) 2 tablet 1  . glimepiride (AMARYL) 2 MG tablet TAKE ONETABLET BY MOUTH DAILY 90 tablet 3  . Lancets (ONETOUCH ULTRASOFT) lancets USE TO CHECK BLOOD SUGAR TWICE DAILY 100 each 3  . LORazepam (ATIVAN) 0.5 MG tablet TAKE 1 TABLET BY MOUTH 2 TIMES DAILY AS NEEDED FOR ANXIETY (Patient not taking: No sig reported) 60 tablet 2  . morphine (MSIR) 15 MG tablet Take 1 tablet (15 mg total) by mouth 2 (two) times daily as needed for severe pain. 30 tablet 0  . ONETOUCH ULTRA test strip USE TO CHECK BLOOD SUGARS TWO TIMES  DAILY 100 each 3  . pantoprazole (PROTONIX) 40 MG tablet TAKE 1 TABLET (40 MG TOTAL) BY MOUTH DAILY. (Patient not taking: Reported on 05/19/2019) 90 tablet 1  . solifenacin (VESICARE) 5 MG tablet Take 5 mg by mouth as needed.      No current facility-administered medications for this visit.    ALLERGIES:  Allergies  Allergen Reactions  . Hydrocodone-Homatropine Other (See Comments)    Vertigo *pt strongly prefers to never take*  . Sulfa Antibiotics     Tongue swells, hives, itching  . Augmentin [Amoxicillin-Pot Clavulanate]  Diarrhea; can take PCN/ Amoxicillin  . Codeine     itch  . Crestor [Rosuvastatin Calcium] Other (See Comments)    Did something to memory   . Doxycycline   . Keflex [Cephalexin] Diarrhea and Nausea And Vomiting  . Lipitor [Atorvastatin Calcium] Other (See Comments)    Makes weak   . Naproxen Other (See Comments)    Stomach cramps  . Prednisone Other (See Comments)    Nervous *pt strongly prefers to never be given prednisone*     PHYSICAL EXAM:  Performance status (ECOG): 1 - Symptomatic but completely ambulatory  There were no vitals filed for this visit. Wt Readings from Last 3 Encounters:  05/19/19 193 lb (87.5 kg)  04/20/19 197 lb (89.4 kg)  04/14/19 196 lb (88.9 kg)   Physical Exam Vitals reviewed.  Constitutional:      Appearance: Normal appearance.  Cardiovascular:     Rate and Rhythm: Normal rate and regular rhythm.     Heart sounds: Normal heart sounds.  Pulmonary:     Effort: Pulmonary effort is normal.     Breath sounds: Normal breath sounds.  Abdominal:     Palpations: Abdomen is soft.  Neurological:     General: No focal deficit present.     Mental Status: She is alert and oriented to person, place, and time.  Psychiatric:        Mood and Affect: Mood normal.        Behavior: Behavior normal.      LABORATORY DATA:  I have reviewed the labs as listed.  CBC Latest Ref Rng & Units 06/06/2019 04/05/2019 04/04/2019  WBC 4.0  - 10.5 K/uL 8.2 8.8 11.8(H)  Hemoglobin 12.0 - 15.0 g/dL 12.5 12.0 12.0  Hematocrit 36.0 - 46.0 % 38.0 37.2 37.6  Platelets 150 - 400 K/uL 256 248 234   CMP Latest Ref Rng & Units 06/06/2019 04/03/2019 04/02/2019  Glucose 70 - 99 mg/dL 166(H) 180(H) 186(H)  BUN 8 - 23 mg/dL 13 14 13   Creatinine 0.44 - 1.00 mg/dL 0.79 0.82 0.69  Sodium 135 - 145 mmol/L 141 136 136  Potassium 3.5 - 5.1 mmol/L 4.2 3.6 3.9  Chloride 98 - 111 mmol/L 108 102 101  CO2 22 - 32 mmol/L 22 24 23   Calcium 8.9 - 10.3 mg/dL 9.5 9.7 9.8  Total Protein 6.5 - 8.1 g/dL - 7.8 8.1  Total Bilirubin 0.3 - 1.2 mg/dL - 0.5 0.5  Alkaline Phos 38 - 126 U/L - 75 82  AST 15 - 41 U/L - 18 16  ALT 0 - 44 U/L - 14 15    DIAGNOSTIC IMAGING:  I have independently reviewed the scans and discussed with the patient. NM PET Image Initial (PI) Skull Base To Thigh  Result Date: 05/17/2019 CLINICAL DATA:  Initial treatment strategy for retroperitoneal mass on CT. No given history of malignancy. EXAM: NUCLEAR MEDICINE PET SKULL BASE TO THIGH TECHNIQUE: 10.4 mCi F-18 FDG was injected intravenously. Full-ring PET imaging was performed from the skull base to thigh after the radiotracer. CT data was obtained and used for attenuation correction and anatomic localization. Fasting blood glucose: 125 mg/dl COMPARISON:  CT chest 04/04/2019 and abdominopelvic CT 04/02/2019. FINDINGS: Mediastinal blood pool activity: SUV max 3.2 Liver activity: SUV max 4.8 NECK: No hypermetabolic cervical lymph nodes are identified.There are no lesions of the pharyngeal mucosal space. Incidental CT findings: Bilateral carotid atherosclerosis. CHEST: There are no hypermetabolic mediastinal, hilar or axillary lymph nodes. No pulmonary hypermetabolic activity or  suspicious nodularity. Incidental CT findings: Atherosclerosis of the aorta, great vessels and coronary arteries. Small hiatal hernia. Mild emphysema with scattered pulmonary scarring. ABDOMEN/PELVIS: There is no  hypermetabolic activity within the liver, adrenal glands, spleen or pancreas. The dominant solid retroperitoneal mass anterior to the aortic bifurcation demonstrates homogeneous hypermetabolic activity with an SUV max of 19.5. This mass measures up to 6.0 x 5.8 cm on image 134/4 and is partially calcified. There is a separate solid component superior to this in the left periaortic region, measuring 3.5 cm on image 122/4, also hypermetabolic (SUV max 54.4). No other hypermetabolic lesions are identified. Incidental CT findings: The previously demonstrated right-sided hydronephrosis and associated perinephric soft tissue stranding have improved. The low-density retroperitoneal mass medial to the lower pole of the left kidney measures 29 HU, 5.4 x 4.1 cm and shows no hypermetabolic activity. This may reflect a lymphocele. Prominent cystocele again noted. SKELETON: There is no hypermetabolic activity to suggest osseous metastatic disease. Incidental CT findings: none IMPRESSION: 1. The previously demonstrated retroperitoneal masses are hypermetabolic, consistent with malignancy. Differential considerations include lymphoma, sarcoma, and less likely, carcinoid tumor. Tissue sampling recommended. 2. Probable associated partial obstruction of the mid right ureter seen on recent CT appears improved. 3. Cystic component medial to the lower pole of the left kidney is without hypermetabolic activity, possibly a lymphocele. 4. No hypermetabolic activity within the neck or chest. 5. Incidental findings including coronary and aortic atherosclerosis, a small hiatal hernia and a cystocele. Electronically Signed   By: Richardean Sale M.D.   On: 05/17/2019 17:03   CT Biopsy  Result Date: 06/06/2019 CLINICAL DATA:  Retroperitoneal masses demonstrating increased metabolic activity by PET scan. EXAM: CT GUIDED CORE BIOPSY OF RETROPERITONEAL MASS ANESTHESIA/SEDATION: 3.0 mg IV Versed; 100 mcg IV Fentanyl Total Moderate Sedation Time:   20 minutes. The patient's level of consciousness and physiologic status were continuously monitored during the procedure by Radiology nursing. PROCEDURE: The procedure risks, benefits, and alternatives were explained to the patient. Questions regarding the procedure were encouraged and answered. The patient understands and consents to the procedure. A time-out was performed prior to initiating the procedure. CT was performed through the lower abdomen and upper pelvis in a prone position. The right trans lumbar region was prepped with chlorhexidine in a sterile fashion, and a sterile drape was applied covering the operative field. A sterile gown and sterile gloves were used for the procedure. Local anesthesia was provided with 1% Lidocaine. Under CT guidance, a 17 gauge trocar needle was advanced from a right trans lumbar paraspinous approach to the level of right sided retroperitoneal tumor/lymphadenopathy. Three coaxial 18 gauge core biopsy samples were obtained and submitted in saline. Additional CT was performed after outer needle removal. COMPLICATIONS: None FINDINGS: Right-sided component of para-aortic retroperitoneal lymphadenopathy was targeted for sampling as it was more approachable and also further away from the abdominal aorta compared to left-sided lymphadenopathy. Solid tissue was obtained. There were no immediate complications. IMPRESSION: CT-guided biopsy of right retroperitoneal lymph node mass. Electronically Signed   By: Aletta Edouard M.D.   On: 06/06/2019 13:10     ASSESSMENT:  1.  Ovarian serous carcinoma/primary peritoneal carcinoma: -PET scan on 05/17/2019 showed solid retroperitoneal mass anterior to the aortic bifurcation with SUV 19.5.  Mass measures 6 x 5.8 cm and is partially calcified.  Separate solid component superior to this in the left periaortic region measures 3.5 cm (SUV 14.3).  Cystic component medial to the lower pole of the left kidney is  without hypermetabolic activity,  possibly a lymphocele. -CT-guided biopsy of the right retroperitoneal lymph node consistent with adenocarcinoma with psammoma bodies.  Morphology and immunotherapy are consistent with ovarian serous carcinoma/primary peritoneal carcinoma.   PLAN:  1.  Ovarian serous carcinoma/primary peritoneal carcinoma: -We discussed pathology report in detail.  We discussed various options including surgery/chemotherapy.  She does not want to have any systemic chemotherapy. -I will make a referral to Dr. Denman George for her opinion. -We will also obtain CA-125. -I have recommended geneticist evaluation for germline mutation testing.  We will also consider NGS testing.  2.  Low back pain: -She complains of right line pain. -She is not taking pain medication (MSIR 15 mg) on a regular basis.  3.  Constipation: -We will start her on lactulose 30 mils at bedtime.  4.  Hypertension: -Continue Norvasc 5 mg tablet daily.  Systolic blood pressure today is 150.    Orders placed this encounter:  No orders of the defined types were placed in this encounter.    Derek Jack, MD [facility] [phone]   I, Jacqualyn Posey, am acting as a scribe for Dr. Sanda Linger.  I, Derek Jack MD, have reviewed the above documentation for accuracy and completeness, and I agree with the above.

## 2019-06-15 NOTE — Patient Instructions (Addendum)
[place] at [facility] Discharge Instructions  You were seen today by Dr. Delton Coombes. He went over your recent results. Dr. Delton Coombes has referred you to see a OBGYN oncologist and geneticist to help determine what is the best treatment course. Chemotherapy and radiation are both viable options. Dr. Delton Coombes has also prescribed liquid lactulose to take as needed for constipation. Dr. Delton Coombes will see you back after your visit with the [facility] oncology specialist for labs and follow up.   Thank you for choosing [place] at [facility] to provide your oncology and hematology care.  To afford each patient quality time with our provider, please arrive at least 15 minutes before your scheduled appointment time.   If you have a lab appointment with the [facility] please come in thru the  Main Entrance and check in at the main information desk  You need to re-schedule your appointment should you arrive 10 or more minutes late.  We strive to give you quality time with our providers, and arriving late affects you and other patients whose appointments are after yours.  Also, if you no show three or more times for appointments you may be dismissed from the clinic at the providers discretion.     Again, thank you for choosing [facility].  Our hope is that these requests will decrease the amount of time that you wait before being seen by our physicians.       _____________________________________________________________  Should you have questions after your visit to [facility], please contact our office at [phone] between the hours of 8:00 a.m. and 4:30 p.m.  Voicemails left after 4:00 p.m. will not be returned until the following business day.  For prescription refill requests, have your pharmacy contact our office and allow 72 hours.    Cancer Center Support Programs:   > Cancer Support Group  2nd Tuesday of the  month 1pm-2pm, Journey Room

## 2019-06-20 ENCOUNTER — Encounter (HOSPITAL_COMMUNITY): Payer: Self-pay | Admitting: *Deleted

## 2019-06-20 DIAGNOSIS — C563 Malignant neoplasm of bilateral ovaries: Secondary | ICD-10-CM | POA: Insufficient documentation

## 2019-06-20 NOTE — Progress Notes (Signed)
I emailed pathology and ordered foundation one on Accession # [id number]

## 2019-06-22 ENCOUNTER — Inpatient Hospital Stay: Payer: Medicare Other | Attending: Gynecologic Oncology | Admitting: Gynecologic Oncology

## 2019-06-22 ENCOUNTER — Other Ambulatory Visit (HOSPITAL_COMMUNITY): Payer: Self-pay | Admitting: *Deleted

## 2019-06-22 ENCOUNTER — Other Ambulatory Visit: Payer: Self-pay

## 2019-06-22 ENCOUNTER — Encounter: Payer: Self-pay | Admitting: Gynecologic Oncology

## 2019-06-22 VITALS — BP 154/60 | HR 91 | Temp 98.7°F | Resp 18 | Ht 64.0 in | Wt 193.0 lb

## 2019-06-22 DIAGNOSIS — K219 Gastro-esophageal reflux disease without esophagitis: Secondary | ICD-10-CM | POA: Insufficient documentation

## 2019-06-22 DIAGNOSIS — E669 Obesity, unspecified: Secondary | ICD-10-CM | POA: Insufficient documentation

## 2019-06-22 DIAGNOSIS — E1136 Type 2 diabetes mellitus with diabetic cataract: Secondary | ICD-10-CM | POA: Insufficient documentation

## 2019-06-22 DIAGNOSIS — M1990 Unspecified osteoarthritis, unspecified site: Secondary | ICD-10-CM | POA: Insufficient documentation

## 2019-06-22 DIAGNOSIS — C786 Secondary malignant neoplasm of retroperitoneum and peritoneum: Secondary | ICD-10-CM

## 2019-06-22 DIAGNOSIS — Z7984 Long term (current) use of oral hypoglycemic drugs: Secondary | ICD-10-CM | POA: Diagnosis not present

## 2019-06-22 DIAGNOSIS — Z79899 Other long term (current) drug therapy: Secondary | ICD-10-CM | POA: Insufficient documentation

## 2019-06-22 DIAGNOSIS — E1140 Type 2 diabetes mellitus with diabetic neuropathy, unspecified: Secondary | ICD-10-CM | POA: Insufficient documentation

## 2019-06-22 DIAGNOSIS — E785 Hyperlipidemia, unspecified: Secondary | ICD-10-CM | POA: Insufficient documentation

## 2019-06-22 DIAGNOSIS — I10 Essential (primary) hypertension: Secondary | ICD-10-CM | POA: Diagnosis not present

## 2019-06-22 DIAGNOSIS — G4730 Sleep apnea, unspecified: Secondary | ICD-10-CM | POA: Diagnosis not present

## 2019-06-22 DIAGNOSIS — C801 Malignant (primary) neoplasm, unspecified: Secondary | ICD-10-CM | POA: Insufficient documentation

## 2019-06-22 DIAGNOSIS — C772 Secondary and unspecified malignant neoplasm of intra-abdominal lymph nodes: Secondary | ICD-10-CM | POA: Diagnosis not present

## 2019-06-22 DIAGNOSIS — E7800 Pure hypercholesterolemia, unspecified: Secondary | ICD-10-CM | POA: Diagnosis not present

## 2019-06-22 DIAGNOSIS — C569 Malignant neoplasm of unspecified ovary: Secondary | ICD-10-CM

## 2019-06-22 DIAGNOSIS — R1900 Intra-abdominal and pelvic swelling, mass and lump, unspecified site: Secondary | ICD-10-CM

## 2019-06-22 NOTE — Patient Instructions (Signed)
The scans and biopsies are showing that you have a stage 3 cancer which likely began in either the ovary or fallopian tube. This is based on the appearance of cancer cells that were removed from lymph nodes. These did not look like a lymph node cancer (meaning it did not begin there, but rather spread to there from another organ). The primary tumor site is obviously small as it cannot be seen on scans. However, the metastatic sites in the lymph nodes are quite large. Unfortunately these metastatic sites are wrapped around, encasing the major blood vessels of the body (vena cava and aorta) and cannot be surgically resected at this time. Additionally, because these are metastatic sites (not the primary origin site of the cancer), surgery would not eradicate the disease.  Therefore, for both of these reasons (inoperability and because it has already spread far away from where it began), the primary treatment for this cancer is chemotherapy.  Dr Denman George is recommending at least 3 doses of chemotherapy to see if this can shrink the tumors off of the blood vessels. If scans show this has happened, you may be a candidate for a "debulking" procedure at that time, before returning to more chemotherapy. If however, the chemotherapy does not cause substantial shrinkage, or if there is poor tolerance to chemotherapy, the alternative treatment would be radiation to the tumors on the blood vessels. This would not cure the disease, but rather control those sites of disease.   Dr Denman George is recommending discussion with Dr Raliegh Ip regarding chemotherapy. Dr Raliegh Ip will send you back to Dr Denman George for discussion after you have received at least 3 doses of chemotherapy.

## 2019-06-22 NOTE — Progress Notes (Signed)
Consult Note: Gyn-Onc  Consult was requested by Dr. Delton Coombes for the evaluation of Laura [name] 74 y.o. female  CC:  Chief Complaint  Patient presents with  . Primary ovarian adenocarcinoma, unspecified laterality [facility])    Assessment/Plan:  Laura [name]  is a 74 y.o.  year old with bulky retroperitoneal metastatic gynecologic high grade carcinoma (likely fallopian tube or ovarian primary).  There are no pelvic masses/visible primary sources on imaging.  I reviewed the PET and CT images with Laura [name], showing her the lymph nodes in the retroperitoneal space which are encasing major vascular structures.  I explained that at present it was an inoperable lesion particularly given that it was metastatic and had not yet been treated with chemotherapy.  I explained that this lesion likely arose primarily in a fallopian tube or ovary, however the primary site is too small to identify on imaging.  I explained that metastatic cancer is not cured with surgery, and that chemotherapy is always necessary for distant metastatic disease (and retroperitoneal lymphadenopathy from gynecologic malignancy is considered distant).  Therefore primary therapy in this case should be chemotherapy with carboplatin and paclitaxel.  After at least 3 cycles repeat imaging should be performed.  At that time if there has been substantial response and less encasement of the aorta and vena cava, consideration for a debulking procedure could be made at that time or later.  If the patient fails to have adequate response to justify a feasible complete resection at the level of the retroperitoneal structures, consideration should be made for radiation to consolidate chemotherapy, or alternatively if she does not tolerate chemotherapy at all palliative intent radiation to the retroperitoneal nodes could be considered.  I explained that the likelihood of cure with adjuvant chemotherapy and surgical debulking for stage IIIc ovarian  fallopian tube cancer is approximately 25%.  I explained that chemotherapy regimens are typically well-tolerated for this cancer and she should consider being treated with this given that there is some possibility of cure despite her advanced disease.  She will return to see Dr. Delton Coombes for further discussion of chemotherapy.  I be happy to see her back after repeat imaging following at least 3 cycles.   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   The patient's history began in March, 2021 when she began experiencing flank and abdominal pains. These were evaluated at [facility] with a CT abd/pelvis which showed mixed solid and cystic lesions within the retroperitoneum centered predominantly in the aortocaval interval and tracking into the right iliac chain.  The largest solid component appeared to measure 5.9 x 4.3 x 6.4 cm.  To the left of the aorta was an additional lesion measuring approximately 4 x 3.6 x 4.6 cm.  The lesions partially encased to the abdominal aorta as well as the IMA origin, proximal right renal artery, and right common iliac.  There was compression of the IVC with loss of clearly discernible fat plane and a small segment concerning for possible intraluminal extension.  The uterus was surgically absent and there were no concerning adnexal masses seen.  No obvious distant metastatic disease was identified.  There was severe right hydroureter ureter nephrosis to the level of the mid ureter.  This was likely secondary to occlusion by the retroperitoneal node masses.  There was moderate left hydronephrosis  possibly secondary to a distended urinary bladder versus compression from the left nodal mass.  She subsequently was seen and evaluated by oncologist, Dr Delton Coombes, who performed a PET scan on May 17, 2019.  This revealed the  previously demonstrated retroperitoneal masses which were hypermetabolic consistent with malignancy.  There is probable associated partial obstruction of the right mid ureter.  There was no hypermetabolic activity within the neck or chest.  There was no hypermetabolic areas within the pelvis.  An IR guided lymph node biopsy was performed on Jun 03, 2019 and revealed adenocarcinoma with some antibodies.  The carcinoma had high-grade features with histology consistent with a possible serous carcinoma.  Immunostains were positive for cytokeratin 7, p53, PAX 8, and weak focal positivity for ER.  This immunostain profile was consistent with a gynecologic primary.  The patient was initially disinterested in chemotherapy and desired surgical resection, not understanding that both, and certainly chemotherapy, would be necessary for curative intent treatment.  The patient's medical history is remarkable for obesity with a BMI of 34 kg meters squared.  She has type 2 diabetes mellitus for which she is treated with oral medications.  She additionally has hypercholesterolemia and hypertension.  Her primary care physician is Dr. Cathlean Cower whom she sees 3-4 times per year.  Her surgical and gynecologic history is remarkable for a vaginal hysterectomy performed in her early 65s for benign uterine fibroids.  Her ovaries and fallopian tubes were not removed at that surgery.  She had a prior tubal ligation after pregnancies.  She had a laparoscopic cholecystectomy and vaginal bladder tacking with Dr. Gaynelle Arabian.  Her family history was remarkable only for daughter with a history of thyroid cancer.  The patient had seen Dr. Karsten Ro in the recent past for bladder discomfort symptoms for which she was treated medically.  She did not have stents placed.  Current Meds:  Outpatient Encounter Medications as of 06/22/2019  Medication Sig  . acetaminophen (TYLENOL) 325 MG tablet Take 325 mg by mouth as needed for mild pain,  moderate pain, fever or headache.   Marland Kitchen amLODipine (NORVASC) 5 MG tablet TAKE ONE (1) TABLET BY MOUTH EVERY DAY (Patient taking differently: Take 5 mg by mouth daily. )  . Blood Glucose Monitoring Suppl (ONE TOUCH ULTRA 2) w/Device KIT Use as directed  . Cholecalciferol (VITAMIN D3) 50 MCG (2000 UT) TABS Take by mouth.  . Cyanocobalamin (VITAMIN B-12 PO) Take by mouth.  Marland Kitchen glimepiride (AMARYL) 2 MG tablet TAKE ONETABLET BY MOUTH DAILY  . Lactulose 20 GM/30ML SOLN Take 30 mLs (20 g total) by mouth at bedtime.  . Lancets (ONETOUCH ULTRASOFT) lancets USE TO CHECK BLOOD SUGAR TWICE DAILY  . Mirabegron (MYRBETRIQ PO) Take by mouth.  . morphine (MSIR) 15 MG tablet Take 1 tablet (15 mg total) by mouth 2 (two) times daily as needed for severe pain.  Glory Rosebush ULTRA test strip USE TO CHECK BLOOD SUGARS TWO TIMES DAILY  . pantoprazole (PROTONIX) 40 MG tablet TAKE 1 TABLET (40 MG TOTAL) BY MOUTH DAILY.  Marland Kitchen Psyllium 57.6 % POWD Take 1 packet by mouth 2 (two) times daily.   No facility-administered encounter medications on file as of 06/22/2019.    Allergy:  Allergies  Allergen Reactions  . Hydrocodone-Homatropine Other (See Comments)    Vertigo *pt strongly prefers to never take*  . Sulfa Antibiotics     Tongue swells, hives, itching  . Augmentin [Amoxicillin-Pot Clavulanate]     Diarrhea; can take PCN/ Amoxicillin  .  Codeine     itch  . Crestor [Rosuvastatin Calcium] Other (See Comments)    Did something to memory   . Doxycycline Other (See Comments)    Severe rectal Gas.  . Keflex [Cephalexin] Diarrhea and Nausea And Vomiting  . Lipitor [Atorvastatin Calcium] Other (See Comments)    Makes weak   . Naproxen Other (See Comments)    Stomach cramps  . Prednisone Other (See Comments)    Nervous *pt strongly prefers to never be given prednisone*     Social Hx:   Social History   Socioeconomic History  . Marital status: Divorced    Spouse name: Not on file  . Number of children: 4  .  Years of education: 50  . Highest education level: Not on file  Occupational History  . Occupation: retired Geographical information systems officer  Tobacco Use  . Smoking status: Never Smoker  . Smokeless tobacco: Never Used  Substance and Sexual Activity  . Alcohol use: No    Alcohol/week: 0.0 standard drinks  . Drug use: No  . Sexual activity: Not Currently  Other Topics Concern  . Not on file  Social History Narrative  . Not on file   Social Determinants of Health   Financial Resource Strain: Low Risk   . Difficulty of Paying Living Expenses: Not hard at all  Food Insecurity: No Food Insecurity  . Worried About Charity fundraiser in the Last Year: Never true  . Ran Out of Food in the Last Year: Never true  Transportation Needs: No Transportation Needs  . Lack of Transportation (Medical): No  . Lack of Transportation (Non-Medical): No  Physical Activity: Inactive  . Days of Exercise per Week: 0 days  . Minutes of Exercise per Session: 0 min  Stress: No Stress Concern Present  . Feeling of Stress : Not at all  Social Connections: Slightly Isolated  . Frequency of Communication with Friends and Family: More than three times a week  . Frequency of Social Gatherings with Friends and Family: More than three times a week  . Attends Religious Services: More than 4 times per year  . Active Member of Clubs or Organizations: Yes  . Attends Archivist Meetings: Never  . Marital Status: Divorced  Human resources officer Violence: Not At Risk  . Fear of Current or Ex-Partner: No  . Emotionally Abused: No  . Physically Abused: No  . Sexually Abused: No    Past Surgical Hx:  Past Surgical History:  Procedure Laterality Date  . BLADDER SURGERY    . BREAST EXCISIONAL BIOPSY Bilateral   . BREAST SURGERY    . CHOLECYSTECTOMY    . fibroids removed     breast (both breasts)  . MASS EXCISION Right 06/26/2016   Procedure: EXCISION MUCOID TUMOR RIGHT THUMB, IP RIGHT THUMB;  Surgeon: Daryll Brod, MD;  Location: [facility];  Service: Orthopedics;  Laterality: Right;  . PARTIAL HYSTERECTOMY      Past Medical Hx:  Past Medical History:  Diagnosis Date  . Anxiety   . Arthritis    back of neck, bones spurs on neck  . Cataract   . Cervical disc disease   . Diabetes mellitus   . Hyperlipidemia   . Hypertension   . Mucoid cyst of joint    right thumb  . Neuropathy   . Reflux   . Sleep apnea    wears CPAP nightly  . Vertigo     Past Gynecological History:  See HPI No LMP recorded. Patient has had a hysterectomy.  Family Hx:  Family History  Problem Relation Age of Onset  . Hypertension Other   . Diabetes Father   . Hypertension Father   . Diabetes Sister   . Hypertension Mother   . Hypertension Sister   . Stroke Sister   . Diabetes Maternal Aunt   . Colon cancer Neg Hx   . Esophageal cancer Neg Hx   . Stomach cancer Neg Hx   . Rectal cancer Neg Hx   . Breast cancer Neg Hx   . Endometrial cancer Neg Hx   . Ovarian cancer Neg Hx     Review of Systems:  Constitutional  Feels well,  Mild flank/back pain  ENT Normal appearing ears and nares bilaterally Skin/Breast  No rash, sores, jaundice, itching, dryness Cardiovascular  No chest pain, shortness of breath, or edema  Pulmonary  No cough or wheeze.  Gastro Intestinal  No nausea, vomitting, or diarrhoea. No bright red blood per rectum, no abdominal pain, change in bowel movement, or constipation.  Genito Urinary  No frequency, urgency, dysuria, denies hematuria Musculo Skeletal  No myalgia, arthralgia, joint swelling or pain  Neurologic  No weakness, numbness, change in gait,  Psychology  No depression, anxiety, insomnia.   Vitals:  Blood pressure (!) 154/60, pulse 91, temperature 98.7 F (37.1 C), temperature source Oral, resp. rate 18, height _0  (1.626 m), weight 193 lb (87.5 kg), SpO2 100 %.  Physical Exam: WD in NAD Neck  Supple NROM, without any enlargements.  Lymph  Node Survey No cervical supraclavicular or inguinal adenopathy Cardiovascular  Pulse normal rate, regularity and rhythm. S1 and S2 normal.  Lungs  Clear to auscultation bilateraly, without wheezes/crackles/rhonchi. Good air movement.  Skin  No rash/lesions/breakdown  Psychiatry  Alert and oriented to person, place, and time  Abdomen  Normoactive bowel sounds, abdomen soft, non-tender and obese without evidence of hernia.  Back No CVA tenderness Genito Urinary  Vulva/vagina: Normal external female genitalia.  No lesions. No discharge or bleeding.  Bladder/urethra:  No lesions or masses, well supported bladder  Vagina: smooth walled, no palpable masses in pelvis on bimanual exam. Surgically absent uterus. Rectal  deferred Extremities  No bilateral cyanosis, clubbing or edema.   Thereasa Solo, MD  06/22/2019, 4:36 PM

## 2019-06-23 ENCOUNTER — Encounter (HOSPITAL_COMMUNITY): Payer: Self-pay | Admitting: Genetic Counselor

## 2019-06-23 ENCOUNTER — Inpatient Hospital Stay (HOSPITAL_COMMUNITY): Payer: Medicare Other

## 2019-06-23 ENCOUNTER — Inpatient Hospital Stay (HOSPITAL_BASED_OUTPATIENT_CLINIC_OR_DEPARTMENT_OTHER): Payer: Medicare Other | Admitting: Genetic Counselor

## 2019-06-23 ENCOUNTER — Ambulatory Visit (HOSPITAL_COMMUNITY): Payer: Medicare Other | Admitting: Genetic Counselor

## 2019-06-23 ENCOUNTER — Other Ambulatory Visit: Payer: Self-pay | Admitting: Internal Medicine

## 2019-06-23 DIAGNOSIS — C569 Malignant neoplasm of unspecified ovary: Secondary | ICD-10-CM | POA: Diagnosis not present

## 2019-06-23 DIAGNOSIS — H538 Other visual disturbances: Secondary | ICD-10-CM | POA: Diagnosis not present

## 2019-06-23 DIAGNOSIS — E785 Hyperlipidemia, unspecified: Secondary | ICD-10-CM | POA: Diagnosis not present

## 2019-06-23 DIAGNOSIS — I10 Essential (primary) hypertension: Secondary | ICD-10-CM | POA: Diagnosis not present

## 2019-06-23 DIAGNOSIS — Z7984 Long term (current) use of oral hypoglycemic drugs: Secondary | ICD-10-CM | POA: Diagnosis not present

## 2019-06-23 DIAGNOSIS — R42 Dizziness and giddiness: Secondary | ICD-10-CM | POA: Diagnosis not present

## 2019-06-23 DIAGNOSIS — E1136 Type 2 diabetes mellitus with diabetic cataract: Secondary | ICD-10-CM | POA: Diagnosis not present

## 2019-06-23 DIAGNOSIS — R1909 Other intra-abdominal and pelvic swelling, mass and lump: Secondary | ICD-10-CM | POA: Diagnosis not present

## 2019-06-23 DIAGNOSIS — M1990 Unspecified osteoarthritis, unspecified site: Secondary | ICD-10-CM | POA: Diagnosis not present

## 2019-06-23 DIAGNOSIS — R5383 Other fatigue: Secondary | ICD-10-CM | POA: Diagnosis not present

## 2019-06-23 DIAGNOSIS — M545 Low back pain: Secondary | ICD-10-CM | POA: Diagnosis not present

## 2019-06-23 DIAGNOSIS — Z808 Family history of malignant neoplasm of other organs or systems: Secondary | ICD-10-CM | POA: Diagnosis not present

## 2019-06-23 DIAGNOSIS — R1900 Intra-abdominal and pelvic swelling, mass and lump, unspecified site: Secondary | ICD-10-CM

## 2019-06-23 DIAGNOSIS — K219 Gastro-esophageal reflux disease without esophagitis: Secondary | ICD-10-CM | POA: Diagnosis not present

## 2019-06-23 DIAGNOSIS — K589 Irritable bowel syndrome without diarrhea: Secondary | ICD-10-CM | POA: Diagnosis not present

## 2019-06-23 DIAGNOSIS — Z79899 Other long term (current) drug therapy: Secondary | ICD-10-CM | POA: Diagnosis not present

## 2019-06-23 NOTE — Telephone Encounter (Signed)
Please refill as per office routine med refill policy (all routine meds refilled for 3 mo or monthly per pt preference up to one year from last visit, then month to month grace period for 3 mo, then further med refills will have to be denied)  

## 2019-06-23 NOTE — Progress Notes (Signed)
REFERRING PROVIDER: Derek Jack, MD [address] [place],  [place] [address]  PRIMARY PROVIDER:  Biagio Borg, MD  PRIMARY REASON FOR VISIT:  1. Primary ovarian adenocarcinoma, unspecified laterality ([place])   2. Family history of thyroid cancer      I connected with Laura [name] on 06/23/2019 at 11:30 am EDT by video conference and verified that I am speaking with the correct person using two identifiers.   Patient location: [facility] Provider location: [facility] office  HISTORY OF PRESENT ILLNESS:   Laura [name], a 74 y.o. female, was seen for a [place] cancer genetics consultation at the request of Dr. Delton Coombes due to a personal history of cancer.  Laura [name] presents to clinic today to discuss the possibility of a hereditary predisposition to cancer, genetic testing, and to further clarify her future cancer risks, as well as potential cancer risks for family members.   In May 2021, at the age of 46, Laura [name] was diagnosed with a stage IIIC retroperitoneal metastatic gynecologic high grade carcinoma (likely fallopian tube or ovarian primary). Laura [name] is considering pursuing chemotherapy for as long as it does not significantly impact her quality of life. Although the lesion is presently inoperable, debulking surgery may be considered in the future if there is a substantial response to chemotherapy. Alternatively, palliative care may be considered if she does not tolerate chemotherapy.   RISK FACTORS:  Menarche was at age 38.  First live birth at age 53.  OCP use for approximately 4-5 months.  Ovaries intact: yes.  Hysterectomy: yes.  Menopausal status: postmenopausal.  HRT use: 0 years. Colonoscopy: yes; most recent on 09/19/2016. Mammogram within the last year: not asked.   Past Medical History:  Diagnosis Date  . Anxiety   . Arthritis    back of neck, bones spurs on neck  . Cataract   . Cervical disc disease   . Diabetes mellitus   . Family history  of thyroid cancer   . Hyperlipidemia   . Hypertension   . Mucoid cyst of joint    right thumb  . Neuropathy   . Reflux   . Sleep apnea    wears CPAP nightly  . Vertigo     Past Surgical History:  Procedure Laterality Date  . BLADDER SURGERY    . BREAST EXCISIONAL BIOPSY Bilateral   . BREAST SURGERY    . CHOLECYSTECTOMY    . fibroids removed     breast (both breasts)  . MASS EXCISION Right 06/26/2016   Procedure: EXCISION MUCOID TUMOR RIGHT THUMB, IP RIGHT THUMB;  Surgeon: Daryll Brod, MD;  Location: [facility];  Service: Orthopedics;  Laterality: Right;  . PARTIAL HYSTERECTOMY      Social History   Socioeconomic History  . Marital status: Divorced    Spouse name: Not on file  . Number of children: 4  . Years of education: 78  . Highest education level: Not on file  Occupational History  . Occupation: retired Geographical information systems officer  Tobacco Use  . Smoking status: Never Smoker  . Smokeless tobacco: Never Used  Vaping Use  . Vaping Use: Never used  Substance and Sexual Activity  . Alcohol use: No    Alcohol/week: 0.0 standard drinks  . Drug use: No  . Sexual activity: Not Currently  Other Topics Concern  . Not on file  Social History Narrative  . Not on file   Social Determinants of Health   Financial Resource  Strain: Low Risk   . Difficulty of Paying Living Expenses: Not hard at all  Food Insecurity: No Food Insecurity  . Worried About Charity fundraiser in the Last Year: Never true  . Ran Out of Food in the Last Year: Never true  Transportation Needs: No Transportation Needs  . Lack of Transportation (Medical): No  . Lack of Transportation (Non-Medical): No  Physical Activity: Inactive  . Days of Exercise per Week: 0 days  . Minutes of Exercise per Session: 0 min  Stress: No Stress Concern Present  . Feeling of Stress : Not at all  Social Connections: Moderately Integrated  . Frequency of Communication with Friends and Family: More  than three times a week  . Frequency of Social Gatherings with Friends and Family: More than three times a week  . Attends Religious Services: More than 4 times per year  . Active Member of Clubs or Organizations: Yes  . Attends Archivist Meetings: Never  . Marital Status: Divorced     FAMILY HISTORY:  We obtained a detailed, 4-generation family history.  Significant diagnoses are listed below: Family History  Problem Relation Age of Onset  . Hypertension Other   . Diabetes Father   . Hypertension Father   . Diabetes Sister   . Hypertension Mother   . Hypertension Sister   . Stroke Sister   . Diabetes Maternal Aunt   . Thyroid cancer Daughter 47  . Colon cancer Neg Hx   . Esophageal cancer Neg Hx   . Stomach cancer Neg Hx   . Rectal cancer Neg Hx   . Breast cancer Neg Hx   . Endometrial cancer Neg Hx   . Ovarian cancer Neg Hx     Laura [name] has two daughters (ages 70 and 50) and two sons (one who is currently age 18, one who died at the age of 38). Her oldest daughter was diagnosed with thyroid cancer when she was in her mid-late 67s, which was treated with surgery and radiation. Laura [name] has two brother and three sisters, ranging in age from 42 to 69, none of whom have had cancer.  Laura [name] mother died at the age of 32 and did not have cancer. She had three maternal aunts (two of whom died as infants) and one maternal uncle. Her maternal grandmother died when she was older than 18 and did not have cancer, and her maternal grandfather died in his 8s from natural causes. There are no known diagnoses of cancer on the maternal side of the family.   Laura [name] father died at the age of 24 and had diabetes, but did not have cancer. She had three paternal aunts and three paternal uncles. One aunt and one uncle died in childhood (around age 43 or 73). The other aunts and uncles died when they were older than 62. Her paternal grandmother died at the age of 41, and her  paternal grandfather died in his 1s. There are no known diagnoses of cancer on the paternal side of the family.  Laura [name] is unaware of previous family history of genetic testing for hereditary cancer risks. Her ancestors are of unknown descent. She is unsure of any Ashkenazi Jewish ancestry. There is no known consanguinity.  GENETIC COUNSELING ASSESSMENT: Laura [name] is a 74 y.o. female with a personal history of gynecologic primary cancer (likely ovarian or fallopian tube in origin) which is somewhat suggestive of a hereditary cancer syndrome and predisposition to cancer. We,  therefore, discussed and recommended the following at today's visit.   DISCUSSION: We discussed that 15-20% of ovarian cancer is hereditary, with most cases associated with the BRCA1 and BRCA2 genes. There are other genes that can be associated with hereditary ovarian cancer syndromes. These include BRIP1, RAD51C, RAD51D, and the Lynch syndrome genes. We discussed that testing is beneficial for several reasons, including identifying whether targeted treatment options such as PARP inhibitors might be beneficial, knowing about other cancer risks, identifying potential screening and risk-reduction options that may be appropriate, and to understand if other family members could be at risk for cancer and allow them to undergo genetic testing.   We also reviewed tumor testing for homologous recombination deficiency (HRD).  We discussed that genetic testing on her blood would test for inherited genetic changes that could explain her diagnosis of cancer and determine who else in the family has an increased risk to develop cancer. HRD testing is genetic testing performed on the tumor that can determine somatic genetic changes that could influence her management. HRD testing is performed in tandem with genetic testing, typically at no additional cost. HRD testing would require a tumor sample obtained from surgery; therefore, we would need to  wait until it is determined if and when she would have a debulking surgery before ordering this genetic testing. Laura [name] declined HRD testing, and would prefer to have only the blood test for inherited genetic changes at this point.  We reviewed the characteristics, features and inheritance patterns of hereditary cancer syndromes. We also discussed genetic testing, including the appropriate family members to test, the process of testing, insurance coverage and turn-around-time for results. We discussed the implications of a negative, positive and/or variant of uncertain significant result. We recommended Laura [name] pursue genetic testing for the [employer].   The Multi-Cancer Panel offered by [facility] includes sequencing and/or deletion duplication testing of the following 85 genes: AIP, ALK, APC, ATM, AXIN2,BAP1,  BARD1, BLM, BMPR1A, BRCA1, BRCA2, BRIP1, CASR, CDC73, CDH1, CDK4, CDKN1B, CDKN1C, CDKN2A (p14ARF), CDKN2A (p16INK4a), CEBPA, CHEK2, CTNNA1, DICER1, DIS3L2, EGFR (c.2369C>T, p.Thr790Met variant only), EPCAM (Deletion/duplication testing only), FH, FLCN, GATA2, GPC3, GREM1 (Promoter region deletion/duplication testing only), HOXB13 (c.251G>A, p.Gly84Glu), HRAS, KIT, MAX, MEN1, MET, MITF (c.952G>A, p.Glu318Lys variant only), MLH1, MSH2, MSH3, MSH6, MUTYH, NBN, NF1, NF2, NTHL1, PALB2, PDGFRA, PHOX2B, PMS2, POLD1, POLE, POT1, PRKAR1A, PTCH1, PTEN, RAD50, RAD51C, RAD51D, RB1, RECQL4, RET, RNF43, RUNX1, SDHAF2, SDHA (sequence changes only), SDHB, SDHC, SDHD, SMAD4, SMARCA4, SMARCB1, SMARCE1, STK11, SUFU, TERC, TERT, TMEM127, TP53, TSC1, TSC2, VHL, WRN and WT1.    Based on Laura [name]'s personal history of cancer, she meets medical criteria for genetic testing. Despite that she meets criteria, she may still have an out of pocket cost. We discussed that if her out of pocket cost for testing is over $100, the laboratory will reach out to let her know. If the out of pocket cost of testing is  less than $100 she will be billed by the genetic testing laboratory.   PLAN: After considering the risks, benefits, and limitations, Laura [name] provided informed consent to pursue genetic testing and the blood sample was sent to [facility] for analysis of the Multi-Cancer Panel. Results should be available within approximately two-three weeks' time, at which point they will be disclosed by telephone to Laura [name]. Venkatesh, as will any additional recommendations warranted by these results. Laura [name]. Ridolfi will receive a summary of her genetic counseling visit and a copy of her results once available. This  information will also be available in Epic.   Laura [name]. Mesina questions were answered to her satisfaction today. Our contact information was provided should additional questions or concerns arise. Thank you for the referral and allowing [place] to share in the care of your patient.   Clint Guy, [place], [facility] Licensed, Certified Dispensing [email or web address] Phone: [phone]  The patient was seen for a total of 60 minutes in face-to-face genetic counseling.  This patient was discussed with Drs. Magrinat, Lindi Adie and/or Burr Medico who agrees with the above.    _______________________________________________________________________ For Office Staff:  Number of people involved in session: 1 Was an Intern/ student involved with case: no

## 2019-06-24 LAB — CA 125: Cancer Antigen (CA) 125: 346 U/mL — ABNORMAL HIGH (ref 0.0–38.1)

## 2019-06-28 ENCOUNTER — Inpatient Hospital Stay (HOSPITAL_BASED_OUTPATIENT_CLINIC_OR_DEPARTMENT_OTHER): Payer: Medicare Other | Admitting: Hematology

## 2019-06-28 ENCOUNTER — Other Ambulatory Visit: Payer: Self-pay

## 2019-06-28 VITALS — BP 143/76 | HR 82 | Temp 98.4°F | Resp 18 | Wt 193.4 lb

## 2019-06-28 DIAGNOSIS — Z79899 Other long term (current) drug therapy: Secondary | ICD-10-CM | POA: Diagnosis not present

## 2019-06-28 DIAGNOSIS — C569 Malignant neoplasm of unspecified ovary: Secondary | ICD-10-CM

## 2019-06-28 DIAGNOSIS — R42 Dizziness and giddiness: Secondary | ICD-10-CM | POA: Diagnosis not present

## 2019-06-28 DIAGNOSIS — R5383 Other fatigue: Secondary | ICD-10-CM | POA: Diagnosis not present

## 2019-06-28 DIAGNOSIS — E1136 Type 2 diabetes mellitus with diabetic cataract: Secondary | ICD-10-CM | POA: Diagnosis not present

## 2019-06-28 DIAGNOSIS — H538 Other visual disturbances: Secondary | ICD-10-CM | POA: Diagnosis not present

## 2019-06-28 DIAGNOSIS — M545 Low back pain: Secondary | ICD-10-CM | POA: Diagnosis not present

## 2019-06-28 DIAGNOSIS — I10 Essential (primary) hypertension: Secondary | ICD-10-CM | POA: Diagnosis not present

## 2019-06-28 DIAGNOSIS — R1909 Other intra-abdominal and pelvic swelling, mass and lump: Secondary | ICD-10-CM | POA: Diagnosis not present

## 2019-06-28 DIAGNOSIS — K219 Gastro-esophageal reflux disease without esophagitis: Secondary | ICD-10-CM | POA: Diagnosis not present

## 2019-06-28 DIAGNOSIS — Z7984 Long term (current) use of oral hypoglycemic drugs: Secondary | ICD-10-CM | POA: Diagnosis not present

## 2019-06-28 DIAGNOSIS — M1990 Unspecified osteoarthritis, unspecified site: Secondary | ICD-10-CM | POA: Diagnosis not present

## 2019-06-28 DIAGNOSIS — K589 Irritable bowel syndrome without diarrhea: Secondary | ICD-10-CM | POA: Diagnosis not present

## 2019-06-28 DIAGNOSIS — E785 Hyperlipidemia, unspecified: Secondary | ICD-10-CM | POA: Diagnosis not present

## 2019-06-28 NOTE — Patient Instructions (Addendum)
[place] at [facility] Discharge Instructions  You were seen today by Dr. Delton Coombes. He went over your recent results. The diagnosis and the possible treatments were discussed today, including chemotherapy. Take the lactulose twice a day if you are still feeling constipated. You will be referred to a gynecological oncologist at [facility]. Dr. Delton Coombes will see you back after your [facility] appointment for labs and follow up.   Thank you for choosing [place] at [facility] to provide your oncology and hematology care.  To afford each patient quality time with our provider, please arrive at least 15 minutes before your scheduled appointment time.   If you have a lab appointment with the [facility] please come in thru the Main Entrance and check in at the main information desk  You need to re-schedule your appointment should you arrive 10 or more minutes late.  We strive to give you quality time with our providers, and arriving late affects you and other patients whose appointments are after yours.  Also, if you no show three or more times for appointments you may be dismissed from the clinic at the providers discretion.     Again, thank you for choosing [facility].  Our hope is that these requests will decrease the amount of time that you wait before being seen by our physicians.       _____________________________________________________________  Should you have questions after your visit to [facility], please contact our office at [phone] between the hours of 8:00 a.m. and 4:30 p.m.  Voicemails left after 4:00 p.m. will not be returned until the following business day.  For prescription refill requests, have your pharmacy contact our office and allow 72 hours.    Cancer Center Support Programs:   > Cancer Support Group  2nd Tuesday of the month 1pm-2pm, Journey Room

## 2019-06-28 NOTE — Progress Notes (Signed)
[name], [place] [id number]   CLINIC:  Medical Oncology/Hematology  PCP:  Biagio Borg, MD [place] / [place] [address] [phone]   REASON FOR VISIT:  Follow-up for ovarian cancer  PRIOR THERAPY: None  CURRENT THERAPY: Recommended chemotherapy with carboplatin and Taxol.  BRIEF ONCOLOGIC HISTORY:  Oncology History   No history exists.    CANCER STAGING: Cancer Staging No matching staging information was found for the patient.  INTERVAL HISTORY:  [name], a 74 y.o. female, returns for routine follow-up of her ovarian cancer. [name] was last seen on 06/15/2019.  She reports that she visited Dr. Denman George and received treatment recommendations. She reports that her pain is controlled by taking 1-2 tablets of morphine per day to going several days without morphine. She is still taking lactulose for constipation. Her appetite is good and she denies having nausea.   REVIEW OF SYSTEMS:  Review of Systems  Constitutional: Positive for appetite change (mildly decreased) and fatigue (mild).  Respiratory: Negative for shortness of breath.   Cardiovascular: Positive for leg swelling (ankles bilat).  Gastrointestinal: Positive for constipation.  All other systems reviewed and are negative.   PAST MEDICAL/SURGICAL HISTORY:  Past Medical History:  Diagnosis Date  . Anxiety   . Arthritis    back of neck, bones spurs on neck  . Cataract   . Cervical disc disease   . Diabetes mellitus   . Family history of thyroid cancer   . Hyperlipidemia   . Hypertension   . Mucoid cyst of joint    right thumb  . Neuropathy   . Reflux   . Sleep apnea    wears CPAP nightly  . Vertigo    Past Surgical History:  Procedure Laterality Date  . BLADDER SURGERY    . BREAST EXCISIONAL BIOPSY Bilateral   . BREAST SURGERY    . CHOLECYSTECTOMY    . fibroids removed     breast (both breasts)  . MASS EXCISION Right 06/26/2016    Procedure: EXCISION MUCOID TUMOR RIGHT THUMB, IP RIGHT THUMB;  Surgeon: Daryll Brod, MD;  Location: [place];  Service: Orthopedics;  Laterality: Right;  . PARTIAL HYSTERECTOMY      SOCIAL HISTORY:  Social History   Socioeconomic History  . Marital status: Divorced    Spouse name: Not on file  . Number of children: 4  . Years of education: 56  . Highest education level: Not on file  Occupational History  . Occupation: retired Geographical information systems officer  Tobacco Use  . Smoking status: Never Smoker  . Smokeless tobacco: Never Used  Vaping Use  . Vaping Use: Never used  Substance and Sexual Activity  . Alcohol use: No    Alcohol/week: 0.0 standard drinks  . Drug use: No  . Sexual activity: Not Currently  Other Topics Concern  . Not on file  Social History Narrative  . Not on file   Social Determinants of Health   Financial Resource Strain: Low Risk   . Difficulty of Paying Living Expenses: Not hard at all  Food Insecurity: No Food Insecurity  . Worried About Charity fundraiser in the Last Year: Never true  . Ran Out of Food in the Last Year: Never true  Transportation Needs: No Transportation Needs  . Lack of Transportation (Medical): No  . Lack of Transportation (Non-Medical): No  Physical Activity: Inactive  . Days of Exercise per Week:  0 days  . Minutes of Exercise per Session: 0 min  Stress: No Stress Concern Present  . Feeling of Stress : Not at all  Social Connections: Moderately Integrated  . Frequency of Communication with Friends and Family: More than three times a week  . Frequency of Social Gatherings with Friends and Family: More than three times a week  . Attends Religious Services: More than 4 times per year  . Active Member of Clubs or Organizations: Yes  . Attends Archivist Meetings: Never  . Marital Status: Divorced  Human resources officer Violence: Not At Risk  . Fear of Current or Ex-Partner: No  . Emotionally Abused: No  .  Physically Abused: No  . Sexually Abused: No    FAMILY HISTORY:  Family History  Problem Relation Age of Onset  . Hypertension Other   . Diabetes Father   . Hypertension Father   . Diabetes Sister   . Hypertension Mother   . Hypertension Sister   . Stroke Sister   . Diabetes Maternal Aunt   . Thyroid cancer Daughter 69  . Colon cancer Neg Hx   . Esophageal cancer Neg Hx   . Stomach cancer Neg Hx   . Rectal cancer Neg Hx   . Breast cancer Neg Hx   . Endometrial cancer Neg Hx   . Ovarian cancer Neg Hx     CURRENT MEDICATIONS:  Current Outpatient Medications  Medication Sig Dispense Refill  . amLODipine (NORVASC) 5 MG tablet TAKE ONE (1) TABLET BY MOUTH EVERY DAY (Patient taking differently: Take 5 mg by mouth daily. ) 90 tablet 1  . Blood Glucose Monitoring Suppl (ONE TOUCH ULTRA 2) w/Device KIT Use as directed 1 each 0  . Cholecalciferol (VITAMIN D3) 50 MCG (2000 UT) TABS Take by mouth.    . Cyanocobalamin (VITAMIN B-12 PO) Take 2,000 mg by mouth daily.     Marland Kitchen glimepiride (AMARYL) 2 MG tablet TAKE ONETABLET BY MOUTH DAILY 90 tablet 3  . Lactulose 20 GM/30ML SOLN Take 30 mLs (20 g total) by mouth at bedtime. 480 mL 3  . Lancets (ONETOUCH ULTRASOFT) lancets USE TO CHECK BLOOD SUGAR TWICE DAILY 100 each 3  . Mirabegron (MYRBETRIQ PO) Take by mouth.    Glory Rosebush ULTRA test strip USE TO CHECK BLOOD SUGARS TWO TIMES DAILY 100 strip 3  . pantoprazole (PROTONIX) 40 MG tablet TAKE 1 TABLET (40 MG TOTAL) BY MOUTH DAILY. 90 tablet 1  . Psyllium 57.6 % POWD Take 1 packet by mouth 2 (two) times daily.    Marland Kitchen acetaminophen (TYLENOL) 325 MG tablet Take 325 mg by mouth as needed for mild pain, moderate pain, fever or headache.  (Patient not taking: Reported on 06/28/2019)    . morphine (MSIR) 15 MG tablet Take 1 tablet (15 mg total) by mouth 2 (two) times daily as needed for severe pain. (Patient not taking: Reported on 06/28/2019) 30 tablet 0   No current facility-administered medications for  this visit.    ALLERGIES:  Allergies  Allergen Reactions  . Hydrocodone-Homatropine Other (See Comments)    Vertigo *pt strongly prefers to never take*  . Sulfa Antibiotics     Tongue swells, hives, itching  . Augmentin [Amoxicillin-Pot Clavulanate]     Diarrhea; can take PCN/ Amoxicillin  . Codeine     itch  . Crestor [Rosuvastatin Calcium] Other (See Comments)    Did something to memory   . Doxycycline Other (See Comments)    Severe rectal Gas.  Marland Kitchen  Keflex [Cephalexin] Diarrhea and Nausea And Vomiting  . Lipitor [Atorvastatin Calcium] Other (See Comments)    Makes weak   . Naproxen Other (See Comments)    Stomach cramps  . Prednisone Other (See Comments)    Nervous *pt strongly prefers to never be given prednisone*     PHYSICAL EXAM:  Performance status (ECOG): 1 - Symptomatic but completely ambulatory  Vitals:   06/28/19 1517  BP: (!) 143/76  Pulse: 82  Resp: 18  Temp: 98.4 F (36.9 C)  SpO2: 95%   Wt Readings from Last 3 Encounters:  06/28/19 193 lb 6.4 oz (87.7 kg)  06/22/19 193 lb (87.5 kg)  06/15/19 193 lb (87.5 kg)   Physical Exam Vitals reviewed.  Constitutional:      Appearance: Normal appearance.  Cardiovascular:     Rate and Rhythm: Normal rate and regular rhythm.     Pulses: Normal pulses.     Heart sounds: Normal heart sounds.  Pulmonary:     Effort: Pulmonary effort is normal.     Breath sounds: Normal breath sounds.  Musculoskeletal:     Right lower leg: Edema (+1) present.     Left lower leg: Edema (+1) present.  Neurological:     General: No focal deficit present.     Mental Status: She is alert and oriented to person, place, and time.  Psychiatric:        Mood and Affect: Mood normal.        Behavior: Behavior normal.      LABORATORY DATA:  I have reviewed the labs as listed.  CBC Latest Ref Rng & Units 06/06/2019 04/05/2019 04/04/2019  WBC 4.0 - 10.5 K/uL 8.2 8.8 11.8(H)  Hemoglobin 12.0 - 15.0 g/dL 12.5 12.0 12.0  Hematocrit  36 - 46 % 38.0 37.2 37.6  Platelets 150 - 400 K/uL 256 248 234   CMP Latest Ref Rng & Units 06/06/2019 04/03/2019 04/02/2019  Glucose 70 - 99 mg/dL 166(H) 180(H) 186(H)  BUN 8 - 23 mg/dL _0 Creatinine 0.44 - 1.00 mg/dL 0.79 0.82 0.69  Sodium 135 - 145 mmol/L 141 136 136  Potassium 3.5 - 5.1 mmol/L 4.2 3.6 3.9  Chloride 98 - 111 mmol/L 108 102 101  CO2 22 - 32 mmol/L _1 Calcium 8.9 - 10.3 mg/dL 9.5 9.7 9.8  Total Protein 6.5 - 8.1 g/dL - 7.8 8.1  Total Bilirubin 0.3 - 1.2 mg/dL - 0.5 0.5  Alkaline Phos 38 - 126 U/L - 75 82  AST 15 - 41 U/L - 18 16  ALT 0 - 44 U/L - 14 15    DIAGNOSTIC IMAGING:  I have independently reviewed the scans and discussed with the patient. CT Biopsy  Result Date: 06/06/2019 CLINICAL DATA:  Retroperitoneal masses demonstrating increased metabolic activity by PET scan. EXAM: CT GUIDED CORE BIOPSY OF RETROPERITONEAL MASS ANESTHESIA/SEDATION: 3.0 mg IV Versed; 100 mcg IV Fentanyl Total Moderate Sedation Time:  20 minutes. The patient's level of consciousness and physiologic status were continuously monitored during the procedure by Radiology nursing. PROCEDURE: The procedure risks, benefits, and alternatives were explained to the patient. Questions regarding the procedure were encouraged and answered. The patient understands and consents to the procedure. A time-out was performed prior to initiating the procedure. CT was performed through the lower abdomen and upper pelvis in a prone position. The right trans lumbar region was prepped with chlorhexidine in a sterile fashion, and a sterile drape was applied covering the operative  field. A sterile gown and sterile gloves were used for the procedure. Local anesthesia was provided with 1% Lidocaine. Under CT guidance, a 17 gauge trocar needle was advanced from a right trans lumbar paraspinous approach to the level of right sided retroperitoneal tumor/lymphadenopathy. Three coaxial 18 gauge core biopsy samples were  obtained and submitted in saline. Additional CT was performed after outer needle removal. COMPLICATIONS: None FINDINGS: Right-sided component of para-aortic retroperitoneal lymphadenopathy was targeted for sampling as it was more approachable and also further away from the abdominal aorta compared to left-sided lymphadenopathy. Solid tissue was obtained. There were no immediate complications. IMPRESSION: CT-guided biopsy of right retroperitoneal lymph node mass. Electronically Signed   By: Aletta Edouard M.D.   On: 06/06/2019 13:10     Surgical Pathology [phone]) biopsy of right retroperitoneal lymph node on 06/06/2019: Adenocarcinoma with psammoma bodies.   ASSESSMENT:  1.  Ovarian serous carcinoma/primary peritoneal carcinoma: -PET scan on 05/17/2019 showed solid retroperitoneal mass anterior to the aortic bifurcation with SUV 19.5.  Mass measures 6 x 5.8 cm and is partially calcified.  Separate solid component superior to this in the left periaortic region measures 3.5 cm, SUV 14.3.  Cystic component medial to the lower pole of the left kidney is without hypermetabolic activity, possibly a lymphocele. -CT-guided biopsy of the right retroperitoneal lymph node consistent with adenocarcinoma with psammoma bodies.  Morphology and immunotherapy consistent with ovarian serous carcinoma/primary peritoneal carcinoma.   PLAN:  1.  Ovarian serous carcinoma: -She met with Dr. Denman George who recommended chemotherapy with carboplatin and paclitaxel for at least 3 cycles followed by repeat imaging.  If there is a substantial response and less encasement of aorta and vena cava, consideration for debulking procedure could be possible at that time. -I have again discussed about chemotherapy and related side effects. -Patient would like to have another opinion from [facility].  I will reach out to GYN oncology at [facility]. -Patient would like to delay port placement until she gets another  opinion.  2.  Low back pain: -She complains of pain in the right loin region. -Continue MSIR 15 mg as needed.  3.  Constipation: -She is taking lactulose 30 mL at bedtime which is not completely helping. -Have told her to increase it to 30 mL twice daily.  4.  Hypertension: -Continue Norvasc 5 mg daily.  She has mild ankle swelling.  Will consider switching it if there is worsening of ankle swelling.    Orders placed this encounter:  No orders of the defined types were placed in this encounter.    Derek Jack, MD [place] [phone]   I, Milinda Antis, am acting as a scribe for Dr. Sanda Linger.  I, Derek Jack MD, have reviewed the above documentation for accuracy and completeness, and I agree with the above.

## 2019-07-01 ENCOUNTER — Other Ambulatory Visit (HOSPITAL_COMMUNITY): Payer: Self-pay

## 2019-07-01 ENCOUNTER — Other Ambulatory Visit (HOSPITAL_COMMUNITY): Payer: Self-pay | Admitting: *Deleted

## 2019-07-01 DIAGNOSIS — C569 Malignant neoplasm of unspecified ovary: Secondary | ICD-10-CM

## 2019-07-01 DIAGNOSIS — R1900 Intra-abdominal and pelvic swelling, mass and lump, unspecified site: Secondary | ICD-10-CM

## 2019-07-01 MED ORDER — MORPHINE SULFATE 15 MG PO TABS: 15.0000 mg | ORAL_TABLET | Freq: Two times a day (BID) | ORAL | 0 refills | Status: DC | PRN

## 2019-07-05 ENCOUNTER — Encounter (HOSPITAL_COMMUNITY): Payer: Self-pay

## 2019-07-05 ENCOUNTER — Telehealth (HOSPITAL_COMMUNITY): Payer: Self-pay | Admitting: *Deleted

## 2019-07-05 NOTE — Telephone Encounter (Signed)
Pt left a voicemail stating that she is having pain with constipation.   I spoke with the pt and she states that she is having very hard, small ball like bowel movements. She states that she has multiple small bowel movements a day but is having pain. She states that she is taking lactulose but it is not helping.   I spoke with Kirby Crigler PA about above and he advised the following: eat 1/2 cup of prunes or raisins daily, drink prune juice, senokot-s 2 tabs BID , one cupful of Miralax BID until BM, if no BM by Thursday give mag citrate.  I advised the pt of above, she wrote instructions down and read them back to me. Pt verbalized understanding.

## 2019-07-05 NOTE — Telephone Encounter (Signed)
Pt called stating that [facility] contacted her today for her appointment with them.

## 2019-07-06 ENCOUNTER — Encounter (HOSPITAL_COMMUNITY): Payer: Self-pay | Admitting: Hematology

## 2019-07-07 DIAGNOSIS — K5909 Other constipation: Secondary | ICD-10-CM | POA: Diagnosis not present

## 2019-07-07 DIAGNOSIS — R971 Elevated cancer antigen 125 [CA 125]: Secondary | ICD-10-CM | POA: Insufficient documentation

## 2019-07-09 ENCOUNTER — Encounter: Payer: Self-pay | Admitting: Internal Medicine

## 2019-07-11 ENCOUNTER — Other Ambulatory Visit: Payer: Self-pay

## 2019-07-11 ENCOUNTER — Ambulatory Visit (INDEPENDENT_AMBULATORY_CARE_PROVIDER_SITE_OTHER): Payer: Medicare Other | Admitting: Internal Medicine

## 2019-07-11 ENCOUNTER — Other Ambulatory Visit: Payer: Self-pay | Admitting: Oncology

## 2019-07-11 ENCOUNTER — Encounter: Payer: Self-pay | Admitting: Internal Medicine

## 2019-07-11 DIAGNOSIS — E1140 Type 2 diabetes mellitus with diabetic neuropathy, unspecified: Secondary | ICD-10-CM

## 2019-07-11 DIAGNOSIS — K5900 Constipation, unspecified: Secondary | ICD-10-CM

## 2019-07-11 DIAGNOSIS — I10 Essential (primary) hypertension: Secondary | ICD-10-CM

## 2019-07-11 MED ORDER — LOSARTAN POTASSIUM-HCTZ 100-12.5 MG PO TABS: 1.0000 | ORAL_TABLET | Freq: Every day | ORAL | 3 refills | Status: DC

## 2019-07-11 NOTE — Telephone Encounter (Signed)
She is scheduled with Dr Jenny Reichmann today at 3:20.

## 2019-07-11 NOTE — Progress Notes (Signed)
Subjective:    Patient ID: Laura [name], female    DOB: 1945-03-17, 74 y.o.   MRN: [id number]  HPI  Here to f/u; overall doing ok,  Pt denies chest pain, increasing sob or doe, wheezing, orthopnea, PND, palpitations, dizziness or syncope, though has had incresed bilat LE sweling in the past month.  Has known abd mass with involvement about the inferior vena cava.  Pt denies new neurological symptoms such as new headache, or facial or extremity weakness or numbness.  Pt denies polydipsia, polyuria, Does also have ongoing now chronic constipation on chronic narcotic BP Readings from Last 3 Encounters:  07/11/19 (!) 150/74  06/28/19 (!) 143/76  06/22/19 (!) 154/60   Wt Readings from Last 3 Encounters:  07/11/19 199 lb (90.3 kg)  06/28/19 193 lb 6.4 oz (87.7 kg)  06/22/19 193 lb (87.5 kg)   Past Medical History:  Diagnosis Date  . Anxiety   . Arthritis    back of neck, bones spurs on neck  . Cataract   . Cervical disc disease   . Diabetes mellitus   . Family history of thyroid cancer   . Hyperlipidemia   . Hypertension   . Mucoid cyst of joint    right thumb  . Neuropathy   . Reflux   . Sleep apnea    wears CPAP nightly  . Vertigo    Past Surgical History:  Procedure Laterality Date  . BLADDER SURGERY    . BREAST EXCISIONAL BIOPSY Bilateral   . BREAST SURGERY    . CHOLECYSTECTOMY    . fibroids removed     breast (both breasts)  . MASS EXCISION Right 06/26/2016   Procedure: EXCISION MUCOID TUMOR RIGHT THUMB, IP RIGHT THUMB;  Surgeon: Daryll Brod, MD;  Location: [place];  Service: Orthopedics;  Laterality: Right;  . PARTIAL HYSTERECTOMY      reports that she has never smoked. She has never used smokeless tobacco. She reports that she does not drink alcohol and does not use drugs. family history includes Diabetes in her father, maternal aunt, and sister; Hypertension in her father, mother, sister, and another family member; Stroke in her sister; Thyroid  cancer (age of onset: 37) in her daughter. Allergies  Allergen Reactions  . Hydrocodone-Homatropine Other (See Comments)    Vertigo *pt strongly prefers to never take*  . Sulfa Antibiotics     Tongue swells, hives, itching  . Augmentin [Amoxicillin-Pot Clavulanate]     Diarrhea; can take PCN/ Amoxicillin  . Codeine     itch  . Crestor [Rosuvastatin Calcium] Other (See Comments)    Did something to memory   . Doxycycline Other (See Comments)    Severe rectal Gas.  . Keflex [Cephalexin] Diarrhea and Nausea And Vomiting  . Lipitor [Atorvastatin Calcium] Other (See Comments)    Makes weak   . Naproxen Other (See Comments)    Stomach cramps  . Prednisone Other (See Comments)    Nervous *pt strongly prefers to never be given prednisone*    Current Outpatient Medications on File Prior to Visit  Medication Sig Dispense Refill  . amLODipine (NORVASC) 5 MG tablet TAKE ONE (1) TABLET BY MOUTH EVERY DAY (Patient taking differently: Take 5 mg by mouth daily. ) 90 tablet 1  . Blood Glucose Monitoring Suppl (ONE TOUCH ULTRA 2) w/Device KIT Use as directed 1 each 0  . Cholecalciferol (VITAMIN D3) 50 MCG (2000 UT) TABS Take by mouth.    . Cyanocobalamin (VITAMIN B-12  PO) Take 2,000 mg by mouth daily.     Marland Kitchen glimepiride (AMARYL) 2 MG tablet TAKE ONETABLET BY MOUTH DAILY 90 tablet 3  . Lactulose 20 GM/30ML SOLN Take 30 mLs (20 g total) by mouth at bedtime. 480 mL 3  . Lancets (ONETOUCH ULTRASOFT) lancets USE TO CHECK BLOOD SUGAR TWICE DAILY 100 each 3  . Mirabegron (MYRBETRIQ PO) Take by mouth.    . morphine (MSIR) 15 MG tablet Take 1 tablet (15 mg total) by mouth 2 (two) times daily as needed for severe pain. 30 tablet 0  . ONETOUCH ULTRA test strip USE TO CHECK BLOOD SUGARS TWO TIMES DAILY 100 strip 3  . pantoprazole (PROTONIX) 40 MG tablet TAKE 1 TABLET (40 MG TOTAL) BY MOUTH DAILY. 90 tablet 1  . Psyllium 57.6 % POWD Take 1 packet by mouth 2 (two) times daily.     No current  facility-administered medications on file prior to visit.   Review of Systems All otherwise neg per pt     Objective:   Physical Exam BP (!) 150/74 (BP Location: Left Arm, Patient Position: Sitting, Cuff Size: Large)   Pulse 75   Temp 97.9 F (36.6 C) (Oral)   Ht _0  (1.626 m)   Wt 199 lb (90.3 kg)   SpO2 97%   BMI 34.16 kg/m  VS noted,  Constitutional: Pt appears in NAD HENT: Head: NCAT.  Right Ear: External ear normal.  Left Ear: External ear normal.  Eyes: . Pupils are equal, round, and reactive to light. Conjunctivae and EOM are normal Nose: without d/c or deformity Neck: Neck supple. Gross normal ROM Cardiovascular: Normal rate and regular rhythm.   Pulmonary/Chest: Effort normal and breath sounds without rales or wheezing.  Abd:  Soft, NT, ND, + BS, no organomegaly Neurological: Pt is alert. At baseline orientation, motor grossly intact Skin: Skin is warm. No rashes, other new lesions, no LE edema Psychiatric: Pt behavior is normal without agitation  All otherwise neg per pt Lab Results  Component Value Date   WBC 8.2 06/06/2019   HGB 12.5 06/06/2019   HCT 38.0 06/06/2019   PLT 256 06/06/2019   GLUCOSE 166 (H) 06/06/2019   CHOL 281 (H) 08/25/2018   TRIG 238.0 (H) 08/25/2018   HDL 52.20 08/25/2018   LDLDIRECT 207.0 08/25/2018   LDLCALC 166 (H) 04/12/2014   ALT 14 04/03/2019   AST 18 04/03/2019   NA 141 06/06/2019   K 4.2 06/06/2019   CL 108 06/06/2019   CREATININE 0.79 06/06/2019   BUN 13 06/06/2019   CO2 22 06/06/2019   TSH 1.65 03/09/2018   INR 1.0 06/06/2019   HGBA1C 7.3 (H) 04/04/2019   MICROALBUR 0.7 03/09/2018      Assessment & Plan:

## 2019-07-11 NOTE — Progress Notes (Signed)
Gynecologic Oncology Multi-Disciplinary Disposition Conference Note  Date of the Conference: 07/11/2019  Patient Name: Laura [name]  Primary GYN Oncologist: Dr. 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 Disposition is to neoadjuvant chemotherapy for 3-6 cycles followed by repeat imaging and consideration for a debulking procedure.  Referral to genetic counseling.   This Multidisciplinary conference took place involving physicians from [place], [place], Radiation Oncology, Pathology, Radiology along with the Gynecologic Oncology Nurse Practitioner and RN.  Comprehensive assessment of the patient's malignancy, staging, need for surgery, chemotherapy, radiation therapy, and need for further testing were reviewed. Supportive measures, both inpatient and following discharge were also discussed. The recommended plan of care is documented. Greater than 35 minutes were spent correlating and coordinating this patient's care.

## 2019-07-11 NOTE — Patient Instructions (Signed)
Please take all new medication as prescribed - the losartan HCT  Please continue all other medications as before, and refills have been done if requested.  Please have the pharmacy call with any other refills you may need.  Please continue your efforts at being more active, low cholesterol diet, and weight control.  Please keep your appointments with your specialists as you may have planned  Please make an Appointment to return in 3 months, or sooner if needed

## 2019-07-12 ENCOUNTER — Encounter: Payer: Self-pay | Admitting: Internal Medicine

## 2019-07-12 ENCOUNTER — Encounter (HOSPITAL_COMMUNITY): Payer: Self-pay | Admitting: *Deleted

## 2019-07-12 NOTE — Assessment & Plan Note (Signed)
stable overall by history and exam, recent data reviewed with pt, and pt to continue medical treatment as before,  to f/u any worsening symptoms or concerns  

## 2019-07-12 NOTE — Assessment & Plan Note (Addendum)
Uncontrolled, to add losartan hct 100-12.5, may help with edema as well, but edema more likely to improve if able to reduce malignancy mass  I spent 31 minutes in preparing to see the patient by review of recent labs, imaging and procedures, obtaining and reviewing separately obtained history, communicating with the patient and family or caregiver, ordering medications, tests or procedures, and documenting clinical information in the EHR including the differential Dx, treatment, and any further evaluation and other management of htn, dm, constipation

## 2019-07-12 NOTE — Assessment & Plan Note (Signed)
To continue senakot started last pm,  to f/u any worsening symptoms or concerns

## 2019-07-13 ENCOUNTER — Other Ambulatory Visit (HOSPITAL_COMMUNITY): Payer: Self-pay | Admitting: *Deleted

## 2019-07-13 DIAGNOSIS — C569 Malignant neoplasm of unspecified ovary: Secondary | ICD-10-CM

## 2019-07-13 NOTE — Progress Notes (Signed)
Patient called clinic reporting that her PCP Dr. Cathlean Cower added Losartan to her medications to try and help with her swelling of lower extremities.  He also mentioned to her to try and get in with [place] to be seen to start treatments because it could also be related to compression of her vena cava from the tumor.    She reports swelling in feet/ankles and legs up to her knees.  No pain, no redness just excess fluid.   I have advised her to keep her legs elevated as much as possible and keep her appt for follow up with [place] next week with Dr. Delton Coombes. She verbalizes understanding.

## 2019-07-13 NOTE — Progress Notes (Signed)
Orders placed for IR to place port for chemotherapy administration.

## 2019-07-14 ENCOUNTER — Telehealth (HOSPITAL_COMMUNITY): Payer: Self-pay

## 2019-07-14 NOTE — Telephone Encounter (Signed)
Called to schedule port placement, no answer, left vm. AW  

## 2019-07-15 ENCOUNTER — Telehealth: Payer: Self-pay | Admitting: Genetic Counselor

## 2019-07-15 ENCOUNTER — Ambulatory Visit: Payer: Self-pay | Admitting: Genetic Counselor

## 2019-07-15 ENCOUNTER — Encounter: Payer: Self-pay | Admitting: Genetic Counselor

## 2019-07-15 DIAGNOSIS — Z1379 Encounter for other screening for genetic and chromosomal anomalies: Secondary | ICD-10-CM | POA: Insufficient documentation

## 2019-07-15 NOTE — Telephone Encounter (Signed)
Revealed negative genetic testing. Discussed that we do not know why she has ovarian/primary peritoneal cancer or why there is cancer in the family. It is possible that there could be a mutation in a different gene that we are not testing, or our current technology may not be able detect certain mutations. It will therefore be important for her to stay in contact with genetics to keep up with whether additional testing may be appropriate in the future.   We also discussed that additional genetic testing would be appropriate for her in the future if she has surgery. If there are certain gene mutations identified in her tumor, targeted treatment options such as PARP inhibitors may be appropriate.

## 2019-07-15 NOTE — Progress Notes (Signed)
HPI:  Ms. Benecke was previously seen in the [place] clinic due to a personal and family history of cancer and concerns regarding a hereditary predisposition to cancer. Please refer to our prior cancer genetics clinic note for more information regarding our discussion, assessment and recommendations, at the time. Ms. Siegenthaler recent genetic test results were disclosed to her, as were recommendations warranted by these results. These results and recommendations are discussed in more detail below.  CANCER HISTORY:  Oncology History  Primary ovarian adenocarcinoma, unspecified laterality ([place])  06/20/2019 Initial Diagnosis   Primary ovarian adenocarcinoma, unspecified laterality ([place])   07/01/2019 Genetic Testing   [facility]     07/11/2019 Genetic Testing   Negative genetic testing:  No pathogenic variants detected on the [facility] Multi-Cancer Panel. The report date is 07/11/2019.  The Multi-Cancer Panel offered by [facility] includes sequencing and/or deletion duplication testing of the following 85 genes: AIP, ALK, APC, ATM, AXIN2,BAP1,  BARD1, BLM, BMPR1A, BRCA1, BRCA2, BRIP1, CASR, CDC73, CDH1, CDK4, CDKN1B, CDKN1C, CDKN2A (p14ARF), CDKN2A (p16INK4a), CEBPA, CHEK2, CTNNA1, DICER1, DIS3L2, EGFR (c.2369C>T, p.Thr790Met variant only), EPCAM (Deletion/duplication testing only), FH, FLCN, GATA2, GPC3, GREM1 (Promoter region deletion/duplication testing only), HOXB13 (c.251G>A, p.Gly84Glu), HRAS, KIT, MAX, MEN1, MET, MITF (c.952G>A, p.Glu318Lys variant only), MLH1, MSH2, MSH3, MSH6, MUTYH, NBN, NF1, NF2, NTHL1, PALB2, PDGFRA, PHOX2B, PMS2, POLD1, POLE, POT1, PRKAR1A, PTCH1, PTEN, RAD50, RAD51C, RAD51D, RB1, RECQL4, RET, RNF43, RUNX1, SDHAF2, SDHA (sequence changes only), SDHB, SDHC, SDHD, SMAD4, SMARCA4, SMARCB1, SMARCE1, STK11, SUFU, TERC, TERT, TMEM127, TP53, TSC1, TSC2, VHL, WRN and WT1.     FAMILY HISTORY:  We obtained a detailed, 4-generation family history.  Significant diagnoses  are listed below: Family History  Problem Relation Age of Onset  . Hypertension Other   . Diabetes Father   . Hypertension Father   . Diabetes Sister   . Hypertension Mother   . Hypertension Sister   . Stroke Sister   . Diabetes Maternal Aunt   . Thyroid cancer Daughter 50  . Colon cancer Neg Hx   . Esophageal cancer Neg Hx   . Stomach cancer Neg Hx   . Rectal cancer Neg Hx   . Breast cancer Neg Hx   . Endometrial cancer Neg Hx   . Ovarian cancer Neg Hx     Ms. Escalona has two daughters (ages 77 and 64) and two sons (one who is currently age 67, one who died at the age of 50). Her oldest daughter was diagnosed with thyroid cancer when she was in her mid-late 69s, which was treated with surgery and radiation. Ms. Deschepper has two brother and three sisters, ranging in age from 15 to 36, none of whom have had cancer.  Ms. [place] mother died at the age of 24 and did not have cancer. She had three maternal aunts (two of whom died as infants) and one maternal uncle. Her maternal grandmother died when she was older than 65 and did not have cancer, and her maternal grandfather died in his 34s from natural causes. There are no known diagnoses of cancer on the maternal side of the family.   Ms. Ehrsam father died at the age of 23 and had diabetes, but did not have cancer. She had three paternal aunts and three paternal uncles. One aunt and one uncle died in childhood (around age 8 or 79). The other aunts and uncles died when they were older than 67. Her paternal grandmother died at the age of 77, and her paternal  grandfather died in his 16s. There are no known diagnoses of cancer on the paternal side of the family.  Ms. Doxtater is unaware of previous family history of genetic testing for hereditary cancer risks. Her ancestors are of unknown descent. She is unsure of any Ashkenazi Jewish ancestry. There is no known consanguinity.  GENETIC TEST RESULTS: Genetic testing reported out on 07/11/2019  through the [facility] Multi-Cancer panel. No pathogenic variants were detected.   The Multi-Cancer Panel offered by [facility] includes sequencing and/or deletion duplication testing of the following 85 genes: AIP, ALK, APC, ATM, AXIN2,BAP1,  BARD1, BLM, BMPR1A, BRCA1, BRCA2, BRIP1, CASR, CDC73, CDH1, CDK4, CDKN1B, CDKN1C, CDKN2A (p14ARF), CDKN2A (p16INK4a), CEBPA, CHEK2, CTNNA1, DICER1, DIS3L2, EGFR (c.2369C>T, p.Thr790Met variant only), EPCAM (Deletion/duplication testing only), FH, FLCN, GATA2, GPC3, GREM1 (Promoter region deletion/duplication testing only), HOXB13 (c.251G>A, p.Gly84Glu), HRAS, KIT, MAX, MEN1, MET, MITF (c.952G>A, p.Glu318Lys variant only), MLH1, MSH2, MSH3, MSH6, MUTYH, NBN, NF1, NF2, NTHL1, PALB2, PDGFRA, PHOX2B, PMS2, POLD1, POLE, POT1, PRKAR1A, PTCH1, PTEN, RAD50, RAD51C, RAD51D, RB1, RECQL4, RET, RNF43, RUNX1, SDHAF2, SDHA (sequence changes only), SDHB, SDHC, SDHD, SMAD4, SMARCA4, SMARCB1, SMARCE1, STK11, SUFU, TERC, TERT, TMEM127, TP53, TSC1, TSC2, VHL, WRN and WT1. The test report will be scanned into EPIC and located under the Molecular Pathology section of the Results Review tab.  A portion of the result report is included below for reference.     We discussed with Ms. Heinke that because current genetic testing is not perfect, it is possible there may be a gene mutation in one of these genes that current testing cannot detect, but that chance is small.  We also discussed that there could be another gene that has not yet been discovered, or that we have not yet tested, that is responsible for the cancer diagnoses in the family. It is also possible there is a hereditary cause for the cancer in the family that Ms. Susi did not inherit and therefore was not identified in her testing.  Therefore, it is important to remain in touch with cancer genetics in the future so that we can continue to offer Ms. Lender the most up to date genetic testing.   ADDITIONAL GENETIC TESTING:  We reviewed  the option to pursue genetic testing on the tumor tissue to determine if there are any somatic mutations that would indicated Ms. Grable may benefit from targeted treatment options (PARP inhibitors) in the future. Ms. Cheever had declined tumor testing previously. Should Ms. Pflieger wish to pursue this additional genetic testing, we are happy to discuss and coordinate this testing, at any time.    CANCER SCREENING RECOMMENDATIONS: Ms. Kopinski test result is considered negative (normal).  This means that we have not identified a hereditary cause for her personal and family history of cancer at this time. Most cancers happen by chance and this negative test suggests that her personal and family of cancer may fall into this category.    While reassuring, this does not definitively rule out a hereditary predisposition to cancer. It is still possible that there could be genetic mutations that are undetectable by current technology. There could be genetic mutations in genes that have not been tested or identified to increase cancer risk.  Therefore, it is recommended she continue to follow the cancer management and screening guidelines provided by her oncology and primary healthcare providers.   An individual's cancer risk and medical management are not determined by genetic test results alone. Overall cancer risk assessment incorporates additional factors, including personal medical  history, family history, and any available genetic information that may result in a personalized plan for cancer prevention and surveillance.  RECOMMENDATIONS FOR FAMILY MEMBERS:  Individuals in this family might be at some increased risk of developing cancer, over the general population risk, simply due to the family history of cancer.  We recommended women in this family have a yearly mammogram beginning at age 22, or 64 years younger than the earliest onset of cancer, an annual clinical breast exam, and perform monthly breast  self-exams. Women in this family should also have a gynecological exam as recommended by their primary provider. All family members should be referred for colonoscopy starting at age 66.  FOLLOW-UP: Lastly, we discussed with Ms. Alcon that cancer genetics is a rapidly advancing field and it is possible that new genetic tests will be appropriate for her and/or her family members in the future. We encouraged her to remain in contact with cancer genetics on an annual basis so we can update her personal and family histories and let her know of advances in cancer genetics that may benefit this family.   Our contact number was provided. Ms. Rochon questions were answered to her satisfaction, and she knows she is welcome to call us at anytime with additional questions or concerns.   Clint Guy, MS, [facility] Genetic Counselor [email or web address] Phone: [phone]

## 2019-07-19 ENCOUNTER — Inpatient Hospital Stay (HOSPITAL_COMMUNITY): Payer: Medicare Other | Attending: Hematology | Admitting: Hematology

## 2019-07-19 ENCOUNTER — Other Ambulatory Visit: Payer: Self-pay

## 2019-07-19 VITALS — BP 145/83 | HR 79 | Temp 97.7°F | Resp 18 | Wt 197.4 lb

## 2019-07-19 DIAGNOSIS — E1136 Type 2 diabetes mellitus with diabetic cataract: Secondary | ICD-10-CM | POA: Diagnosis not present

## 2019-07-19 DIAGNOSIS — M545 Low back pain: Secondary | ICD-10-CM | POA: Diagnosis not present

## 2019-07-19 DIAGNOSIS — C569 Malignant neoplasm of unspecified ovary: Secondary | ICD-10-CM | POA: Insufficient documentation

## 2019-07-19 DIAGNOSIS — Z9989 Dependence on other enabling machines and devices: Secondary | ICD-10-CM | POA: Insufficient documentation

## 2019-07-19 DIAGNOSIS — I10 Essential (primary) hypertension: Secondary | ICD-10-CM | POA: Insufficient documentation

## 2019-07-19 DIAGNOSIS — G4730 Sleep apnea, unspecified: Secondary | ICD-10-CM | POA: Diagnosis not present

## 2019-07-19 DIAGNOSIS — Z79899 Other long term (current) drug therapy: Secondary | ICD-10-CM | POA: Diagnosis not present

## 2019-07-19 DIAGNOSIS — Z7984 Long term (current) use of oral hypoglycemic drugs: Secondary | ICD-10-CM | POA: Insufficient documentation

## 2019-07-19 DIAGNOSIS — R05 Cough: Secondary | ICD-10-CM | POA: Diagnosis not present

## 2019-07-19 DIAGNOSIS — K219 Gastro-esophageal reflux disease without esophagitis: Secondary | ICD-10-CM | POA: Insufficient documentation

## 2019-07-19 DIAGNOSIS — R5383 Other fatigue: Secondary | ICD-10-CM | POA: Diagnosis not present

## 2019-07-19 DIAGNOSIS — R42 Dizziness and giddiness: Secondary | ICD-10-CM | POA: Diagnosis not present

## 2019-07-19 DIAGNOSIS — E785 Hyperlipidemia, unspecified: Secondary | ICD-10-CM | POA: Diagnosis not present

## 2019-07-19 DIAGNOSIS — Z5111 Encounter for antineoplastic chemotherapy: Secondary | ICD-10-CM | POA: Insufficient documentation

## 2019-07-19 DIAGNOSIS — Z5189 Encounter for other specified aftercare: Secondary | ICD-10-CM | POA: Insufficient documentation

## 2019-07-19 DIAGNOSIS — K5900 Constipation, unspecified: Secondary | ICD-10-CM | POA: Insufficient documentation

## 2019-07-19 DIAGNOSIS — R600 Localized edema: Secondary | ICD-10-CM | POA: Diagnosis not present

## 2019-07-19 NOTE — Patient Instructions (Signed)
[place] at [facility] Discharge Instructions  You were seen today by Dr. Delton Coombes. He went over your recent test results. He reviewed the possible side effects you may experience. He will schedule you for chemo teaching. He will see you back in 1 week for labs, treatment and follow up.   Thank you for choosing [place] at [facility] to provide your oncology and hematology care.  To afford each patient quality time with our provider, please arrive at least 15 minutes before your scheduled appointment time.   If you have a lab appointment with the [facility] please come in thru the  Main Entrance and check in at the main information desk  You need to re-schedule your appointment should you arrive 10 or more minutes late.  We strive to give you quality time with our providers, and arriving late affects you and other patients whose appointments are after yours.  Also, if you no show three or more times for appointments you may be dismissed from the clinic at the providers discretion.     Again, thank you for choosing [facility].  Our hope is that these requests will decrease the amount of time that you wait before being seen by our physicians.       _____________________________________________________________  Should you have questions after your visit to [facility], please contact our office at [phone] between the hours of 8:00 a.m. and 4:30 p.m.  Voicemails left after 4:00 p.m. will not be returned until the following business day.  For prescription refill requests, have your pharmacy contact our office and allow 72 hours.    Cancer Center Support Programs:   > Cancer Support Group  2nd Tuesday of the month 1pm-2pm, Journey Room

## 2019-07-19 NOTE — Progress Notes (Signed)
START ON PATHWAY REGIMEN - Ovarian     A cycle is every 21 days:     Paclitaxel      Carboplatin   **Always confirm dose/schedule in your pharmacy ordering system**  Patient Characteristics: Preoperative or Nonsurgical Candidate (Clinical Staging), Newly Diagnosed, Neoadjuvant Therapy followed by Surgery Therapeutic Status: Preoperative or Nonsurgical Candidate (Clinical Staging) BRCA Mutation Status: Absent AJCC T Category: cTX AJCC 8 Stage Grouping: IIIC AJCC N Category: cNX AJCC M Category: cM0 Therapy Plan: Neoadjuvant Therapy followed by Surgery Intent of Therapy: Curative Intent, Discussed with Patient

## 2019-07-19 NOTE — Progress Notes (Signed)
[address], [place] [id number]   CLINIC:  Medical Oncology/Hematology  PCP:  Biagio Borg, MD [place] / [place] [address] [phone]   REASON FOR VISIT:  Follow-up for ovarian cancer  PRIOR THERAPY: None  NGS Result: Foundation 1 MS--stable  CURRENT THERAPY: Recommended chemotherapy with carboplatin and Taxol  BRIEF ONCOLOGIC HISTORY:  Oncology History  Primary ovarian adenocarcinoma, unspecified laterality ([facility])  06/20/2019 Initial Diagnosis   Primary ovarian adenocarcinoma, unspecified laterality ([place])   07/01/2019 Genetic Testing   Foundation One     07/11/2019 Genetic Testing   Negative genetic testing:  No pathogenic variants detected on the [facility] Multi-Cancer Panel. The report date is 07/11/2019.  The Multi-Cancer Panel offered by [facility] includes sequencing and/or deletion duplication testing of the following 85 genes: AIP, ALK, APC, ATM, AXIN2,BAP1,  BARD1, BLM, BMPR1A, BRCA1, BRCA2, BRIP1, CASR, CDC73, CDH1, CDK4, CDKN1B, CDKN1C, CDKN2A (p14ARF), CDKN2A (p16INK4a), CEBPA, CHEK2, CTNNA1, DICER1, DIS3L2, EGFR (c.2369C>T, p.Thr790Met variant only), EPCAM (Deletion/duplication testing only), FH, FLCN, GATA2, GPC3, GREM1 (Promoter region deletion/duplication testing only), HOXB13 (c.251G>A, p.Gly84Glu), HRAS, KIT, MAX, MEN1, MET, MITF (c.952G>A, p.Glu318Lys variant only), MLH1, MSH2, MSH3, MSH6, MUTYH, NBN, NF1, NF2, NTHL1, PALB2, PDGFRA, PHOX2B, PMS2, POLD1, POLE, POT1, PRKAR1A, PTCH1, PTEN, RAD50, RAD51C, RAD51D, RB1, RECQL4, RET, RNF43, RUNX1, SDHAF2, SDHA (sequence changes only), SDHB, SDHC, SDHD, SMAD4, SMARCA4, SMARCB1, SMARCE1, STK11, SUFU, TERC, TERT, TMEM127, TP53, TSC1, TSC2, VHL, WRN and WT1.     CANCER STAGING: Cancer Staging No matching staging information was found for the patient.  INTERVAL HISTORY:  [name], a 74 y.o. female, returns for routine follow-up of her ovarian cancer. [name] was last  seen on 06/28/2019.  She was seen by Dr. Fransisca Connors at [facility] for a second opinion. Today she reports that she takes the morphine every other day. She also reports having severe constipation and only Senakot has worked. Her legs have been swollen up to her thighs.  She was seen by her primary doctor who prescribed her antihypertensive with hydrochlorothiazide in it.  She has been taking Norvasc for many years.   REVIEW OF SYSTEMS:  Review of Systems  Constitutional: Positive for appetite change (moderately decreased) and fatigue (severe).  Respiratory: Positive for cough.   Cardiovascular: Positive for leg swelling (ankles up to the thighs).  Gastrointestinal: Positive for constipation (severe).  Neurological: Positive for dizziness.  Psychiatric/Behavioral: Positive for sleep disturbance (occasional).  All other systems reviewed and are negative.   PAST MEDICAL/SURGICAL HISTORY:  Past Medical History:  Diagnosis Date  . Anxiety   . Arthritis    back of neck, bones spurs on neck  . Cataract   . Cervical disc disease   . Diabetes mellitus   . Family history of thyroid cancer   . Hyperlipidemia   . Hypertension   . Mucoid cyst of joint    right thumb  . Neuropathy   . Reflux   . Sleep apnea    wears CPAP nightly  . Vertigo    Past Surgical History:  Procedure Laterality Date  . BLADDER SURGERY    . BREAST EXCISIONAL BIOPSY Bilateral   . BREAST SURGERY    . CHOLECYSTECTOMY    . fibroids removed     breast (both breasts)  . MASS EXCISION Right 06/26/2016   Procedure: EXCISION MUCOID TUMOR RIGHT THUMB, IP RIGHT THUMB;  Surgeon: Daryll Brod, MD;  Location: [facility];  Service: Orthopedics;  Laterality: Right;  .  PARTIAL HYSTERECTOMY      SOCIAL HISTORY:  Social History   Socioeconomic History  . Marital status: Divorced    Spouse name: Not on file  . Number of children: 4  . Years of education: 51  . Highest education level: Not on file  Occupational  History  . Occupation: retired Geographical information systems officer  Tobacco Use  . Smoking status: Never Smoker  . Smokeless tobacco: Never Used  Vaping Use  . Vaping Use: Never used  Substance and Sexual Activity  . Alcohol use: No    Alcohol/week: 0.0 standard drinks  . Drug use: No  . Sexual activity: Not Currently  Other Topics Concern  . Not on file  Social History Narrative  . Not on file   Social Determinants of Health   Financial Resource Strain: Low Risk   . Difficulty of Paying Living Expenses: Not hard at all  Food Insecurity: No Food Insecurity  . Worried About Charity fundraiser in the Last Year: Never true  . Ran Out of Food in the Last Year: Never true  Transportation Needs: No Transportation Needs  . Lack of Transportation (Medical): No  . Lack of Transportation (Non-Medical): No  Physical Activity: Inactive  . Days of Exercise per Week: 0 days  . Minutes of Exercise per Session: 0 min  Stress: No Stress Concern Present  . Feeling of Stress : Not at all  Social Connections: Moderately Integrated  . Frequency of Communication with Friends and Family: More than three times a week  . Frequency of Social Gatherings with Friends and Family: More than three times a week  . Attends Religious Services: More than 4 times per year  . Active Member of Clubs or Organizations: Yes  . Attends Archivist Meetings: Never  . Marital Status: Divorced  Human resources officer Violence: Not At Risk  . Fear of Current or Ex-Partner: No  . Emotionally Abused: No  . Physically Abused: No  . Sexually Abused: No    FAMILY HISTORY:  Family History  Problem Relation Age of Onset  . Hypertension Other   . Diabetes Father   . Hypertension Father   . Diabetes Sister   . Hypertension Mother   . Hypertension Sister   . Stroke Sister   . Diabetes Maternal Aunt   . Thyroid cancer Daughter 43  . Colon cancer Neg Hx   . Esophageal cancer Neg Hx   . Stomach cancer Neg Hx   . Rectal  cancer Neg Hx   . Breast cancer Neg Hx   . Endometrial cancer Neg Hx   . Ovarian cancer Neg Hx     CURRENT MEDICATIONS:  Current Outpatient Medications  Medication Sig Dispense Refill  . amLODipine (NORVASC) 5 MG tablet TAKE ONE (1) TABLET BY MOUTH EVERY DAY (Patient taking differently: Take 5 mg by mouth daily. ) 90 tablet 1  . Blood Glucose Monitoring Suppl (ONE TOUCH ULTRA 2) w/Device KIT Use as directed 1 each 0  . Cholecalciferol (VITAMIN D3) 50 MCG (2000 UT) TABS Take by mouth.    . Cyanocobalamin (VITAMIN B-12 PO) Take 2,000 mg by mouth daily.     Marland Kitchen glimepiride (AMARYL) 2 MG tablet TAKE ONETABLET BY MOUTH DAILY 90 tablet 3  . Lactulose 20 GM/30ML SOLN Take 30 mLs (20 g total) by mouth at bedtime. 480 mL 3  . Lancets (ONETOUCH ULTRASOFT) lancets USE TO CHECK BLOOD SUGAR TWICE DAILY 100 each 3  . losartan-hydrochlorothiazide (HYZAAR) 100-12.5 MG  tablet Take 1 tablet by mouth daily. 90 tablet 3  . Mirabegron (MYRBETRIQ PO) Take by mouth.    . morphine (MSIR) 15 MG tablet Take 1 tablet (15 mg total) by mouth 2 (two) times daily as needed for severe pain. 30 tablet 0  . ONETOUCH ULTRA test strip USE TO CHECK BLOOD SUGARS TWO TIMES DAILY 100 strip 3  . pantoprazole (PROTONIX) 40 MG tablet TAKE 1 TABLET (40 MG TOTAL) BY MOUTH DAILY. 90 tablet 1  . Psyllium 57.6 % POWD Take 1 packet by mouth 2 (two) times daily.     No current facility-administered medications for this visit.    ALLERGIES:  Allergies  Allergen Reactions  . Hydrocodone-Homatropine Other (See Comments)    Vertigo *pt strongly prefers to never take*  . Sulfa Antibiotics     Tongue swells, hives, itching  . Augmentin [Amoxicillin-Pot Clavulanate]     Diarrhea; can take PCN/ Amoxicillin  . Codeine     itch  . Crestor [Rosuvastatin Calcium] Other (See Comments)    Did something to memory   . Doxycycline Other (See Comments)    Severe rectal Gas.  . Keflex [Cephalexin] Diarrhea and Nausea And Vomiting  . Lipitor  [Atorvastatin Calcium] Other (See Comments)    Makes weak   . Naproxen Other (See Comments)    Stomach cramps  . Prednisone Other (See Comments)    Nervous *pt strongly prefers to never be given prednisone*     PHYSICAL EXAM:  Performance status (ECOG): 1 - Symptomatic but completely ambulatory  There were no vitals filed for this visit. Wt Readings from Last 3 Encounters:  07/11/19 90.3 kg (199 lb)  06/28/19 87.7 kg (193 lb 6.4 oz)  06/22/19 87.5 kg (193 lb)   Physical Exam Vitals reviewed.  Constitutional:      Appearance: Normal appearance. She is obese.  Cardiovascular:     Rate and Rhythm: Normal rate and regular rhythm.     Pulses: Normal pulses.     Heart sounds: Normal heart sounds.  Pulmonary:     Effort: Pulmonary effort is normal.     Breath sounds: Normal breath sounds.  Musculoskeletal:     Right lower leg: No edema.     Left lower leg: No edema.  Neurological:     General: No focal deficit present.     Mental Status: She is alert and oriented to person, place, and time.  Psychiatric:        Mood and Affect: Mood normal.        Behavior: Behavior normal.      LABORATORY DATA:  I have reviewed the labs as listed.  CBC Latest Ref Rng & Units 06/06/2019 04/05/2019 04/04/2019  WBC 4.0 - 10.5 K/uL 8.2 8.8 11.8(H)  Hemoglobin 12.0 - 15.0 g/dL 12.5 12.0 12.0  Hematocrit 36 - 46 % 38.0 37.2 37.6  Platelets 150 - 400 K/uL 256 248 234   CMP Latest Ref Rng & Units 06/06/2019 04/03/2019 04/02/2019  Glucose 70 - 99 mg/dL 166(H) 180(H) 186(H)  BUN 8 - 23 mg/dL 13 14 13   Creatinine 0.44 - 1.00 mg/dL 0.79 0.82 0.69  Sodium 135 - 145 mmol/L 141 136 136  Potassium 3.5 - 5.1 mmol/L 4.2 3.6 3.9  Chloride 98 - 111 mmol/L 108 102 101  CO2 22 - 32 mmol/L 22 24 23   Calcium 8.9 - 10.3 mg/dL 9.5 9.7 9.8  Total Protein 6.5 - 8.1 g/dL - 7.8 8.1  Total Bilirubin 0.3 - 1.2  mg/dL - 0.5 0.5  Alkaline Phos 38 - 126 U/L - 75 82  AST 15 - 41 U/L - 18 16  ALT 0 - 44 U/L - 14 15     DIAGNOSTIC IMAGING:  I have independently reviewed the scans and discussed with the patient.   ASSESSMENT:  1.    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  Mass measures 6 x 5.8 cm and is partially calcified.  Separate solid component superior to this in the left periaortic region measures 3.5 cm, SUV 14.3.  Cystic component medial to the lower pole of the left kidney is without hypermetabolic activity, possibly a lymphocele. -CT-guided biopsy of the right retroperitoneal lymph node consistent with adenocarcinoma with psammoma bodies.  Morphology and immunotherapy consistent with ovarian serous carcinoma/primary peritoneal carcinoma. -Germline mutation testing was negative. -Foundation 1 testing shows MS-stable.  Loss of heterozygosity was less than 16%. -She met with Dr. Denman George who recommended neoadjuvant chemotherapy.  If there is substantial response unless encasement of the aorta and vena cava, consideration for debulking procedure can be possible at that time.   PLAN:  1.  Ovarian serous carcinoma: -She met with GYN oncology at [facility] as a virtual visit.  She reports that the visit was not completed as she lost connection.  However she was able to speak to the PA. -I have again discussed that her case was presented at the tumor board conference and neoadjuvant chemotherapy with 3-6 cycles of carboplatin and paclitaxel was recommended with interim staging after 3 cycles. -We discussed side effects of carboplatin and paclitaxel in detail.  Patient was overall willing to consider treatment at this time. -We will schedule her for port placement. -I will likely start her on lower dose as she is so concerned about side effects.  2.  Low back pain: -Continue MSIR 15 mg as needed.  She is taking 1 tablet every other day.  3.  Constipation: -Lactulose  did not help. -She is taking Senokot 1 tablet daily which is helping.  4.  Hypertension: -Continue Norvasc 5 mg daily. -Continue losartan/hydrochlorothiazide 100-12.5 mg daily.  5.  Leg swelling: -This is likely from compression of inferior vena cava.  She reports improvement when she props up her legs.  This will likely improve with treatment.   Orders placed this encounter:  No orders of the defined types were placed in this encounter.  Total time spent is 40 minutes with more than 50% of the time spent face-to-face discussing and reviewing medical records, treatment plan, side effects, counseling, coordination of care.  Derek Jack, MD [place] [phone]   I, Milinda Antis, am acting as a scribe for Dr. Sanda Linger.  I, Derek Jack MD, have reviewed the above documentation for accuracy and completeness, and I agree with the above.

## 2019-07-21 ENCOUNTER — Other Ambulatory Visit: Payer: Self-pay

## 2019-07-21 ENCOUNTER — Encounter (HOSPITAL_COMMUNITY): Payer: Self-pay

## 2019-07-21 ENCOUNTER — Inpatient Hospital Stay (HOSPITAL_COMMUNITY): Payer: Medicare Other

## 2019-07-21 DIAGNOSIS — C569 Malignant neoplasm of unspecified ovary: Secondary | ICD-10-CM

## 2019-07-21 DIAGNOSIS — Z95828 Presence of other vascular implants and grafts: Secondary | ICD-10-CM

## 2019-07-21 HISTORY — DX: Presence of other vascular implants and grafts: Z95.828

## 2019-07-21 MED ORDER — LIDOCAINE-PRILOCAINE 2.5-2.5 % EX CREA: TOPICAL_CREAM | CUTANEOUS | 3 refills | Status: DC

## 2019-07-21 MED ORDER — PROCHLORPERAZINE MALEATE 10 MG PO TABS: 10.0000 mg | ORAL_TABLET | Freq: Four times a day (QID) | ORAL | 1 refills | Status: DC | PRN

## 2019-07-21 NOTE — Progress Notes (Signed)
Chemotherapy education packet given and discussed with pt in detail.  Discussed diagnosis, staging, tx regimen, and intent of tx.  Reviewed chemotherapy medications and side effects, as well as pre-medications.  Instructed on how to manage side effects at home, and when to call the clinic.  Importance of fever/chills discussed with pt. Discussed precautions to implement at home after receiving tx, as well as self care strategies. Phone numbers provided for clinic during regular working hours, also how to reach the clinic after hours and on weekends. Pt provided the opportunity to ask questions - all questions answered to pt's satisfaction.    

## 2019-07-21 NOTE — Patient Instructions (Addendum)
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  You will be treated in the clinic every 3 weeks with a combination of chemotherapy drugs - paclitaxel (Taxol) and carboplatin.  The intent of treatment is to cure your disease.  You will see the doctor regularly throughout treatment.  We will obtain blood work from you prior to every treatment and monitor your results to make sure it is safe to give your treatment. The doctor monitors your response to treatment by the way you are feeling, your blood work, and by obtaining scans periodically.  There will be wait times while you are here for treatment.  It will take about 30 minutes to 1 hour for your lab work to result.  Then there will be wait times while pharmacy mixes your medications.    Medications you will receive in the clinic prior to your chemotherapy medications:  Aloxi:  ALOXI is used in adults to help prevent the nausea and vomiting that happens with certain chemotherapy drugs.  Aloxi is a long acting medication, and will remain in your system for about 2 days.   Emend:  This is an anti-nausea medication that is used with Aloxi to help prevent nausea and vomiting caused by chemotherapy.  Dexamethasone:  This is a steroid given prior to chemotherapy to help prevent allergic reactions; it may also help prevent and control nausea and diarrhea.   Pepcid:  This medication is a histamine blocker that helps prevent and allergic reaction to your chemotherapy.   Benadryl:  This is a histamine blocker (different from the Pepcid) that helps prevent allergic/infusion reactions to your chemotherapy. This medication may cause dizziness/drowsiness.    Paclitaxel (Taxol)  About This Drug  Paclitaxel is a drug used to treat cancer. It is given in the vein (IV).  This will take 3 hours to infuse.  This first infusion will take longer because it is  increased slowly to monitor for reactions.  The nurse will be in the room with you for the first 15 minutes of the first infusion.  Possible Side Effects  . Hair loss. Hair loss is often temporary, although with certain medicine, hair loss can sometimes be permanent. Hair loss may happen suddenly or gradually. If you lose hair, you may lose it from your head, face, armpits, pubic area, chest, and/or legs. You may also notice your hair getting thin.  . Swelling of your legs, ankles and/or feet (edema)  . Flushing  . Nausea and throwing up (vomiting)  . Loose bowel movements (diarrhea)  . Bone marrow depression. This is a decrease in the number of white blood cells, red blood cells, and platelets. This may raise your risk of infection, make you tired and weak (fatigue), and raise your risk of bleeding.  . Effects on the nerves are called peripheral neuropathy. You may feel numbness, tingling, or pain in your hands and feet. It may be hard for you to button your clothes, open jars, or walk as usual. The effect on the nerves may get worse with more doses of the drug. These effects get better in some people after the drug is stopped but it does not get better in all people.  . Changes in your liver function  . Bone, joint and muscle pain  . Abnormal EKG  . Allergic reaction: Allergic reactions, including anaphylaxis are rare but may happen in some patients. Signs of allergic reaction to this drug  may be swelling of the face, feeling like your tongue or throat are swelling, trouble breathing, rash, itching, fever, chills, feeling dizzy, and/or feeling that your heart is beating in a fast or not normal way. If this happens, do not take another dose of this drug. You should get urgent medical treatment.  . Infection  . Changes in your kidney function.  Note: Each of the side effects above was reported in 20% or greater of patients treated with paclitaxel. Not all possible side effects are  included above.   Warnings and Precautions  . Severe allergic reactions  . Severe bone marrow depression   Treating Side Effects  . To help with hair loss, wash with a mild shampoo and avoid washing your hair every day.  . Avoid rubbing your scalp, instead, pat your hair or scalp dry  . Avoid coloring your hair  . Limit your use of hair spray, electric curlers, blow dryers, and curling irons.  . If you are interested in getting a wig, talk to your nurse. You can also call the [facility] at [phone]-2345 to find out information about the "Look Good, Feel Better" program close to where you live. It is a free program where women getting chemotherapy can learn about wigs, turbans and scarves as well as makeup techniques and skin and nail care.  . Ask your doctor or nurse about medicines that are available to help stop or lessen diarrhea and/or nausea.  . To help with nausea and vomiting, eat small, frequent meals instead of three large meals a day. Choose foods and drinks that are at room temperature. Ask your nurse or doctor about other helpful tips and medicine that is available to help or stop lessen these symptoms.  . If you get diarrhea, eat low-fiber foods that are high in protein and calories and avoid foods that can irritate your digestive tracts or lead to cramping. Ask your nurse or doctor about medicine that can lessen or stop your diarrhea.  . Mouth care is very important. Your mouth care should consist of routine, gentle cleaning of your teeth or dentures and rinsing your mouth with a mixture of 1/2 teaspoon of salt in 8 ounces of water or  teaspoon of baking soda in 8 ounces of water. This should be done at least after each meal and at bedtime.  . If you have mouth sores, avoid mouthwash that has alcohol. Also avoid alcohol and smoking because they can bother your mouth and throat.  . Drink plenty of fluids (a minimum of eight glasses per day is  recommended).  . Take your temperature as your doctor or nurse tells you, and whenever you feel like you may have a fever.  . Talk to your doctor or nurse about precautions you can take to avoid infections and bleeding.  . Be careful when cooking, walking, and handling sharp objects and hot liquids.  Food and Drug Interactions  . There are no known interactions of paclitaxel with food.  . This drug may interact with other medicines. Tell your doctor and pharmacist about all the medicines and dietary supplements (vitamins, minerals, herbs and others) that you are taking at this time.  . The safety and use of dietary supplements and alternative diets are often not known. Using these might affect your cancer or interfere with your treatment. Until more is known, you should not use dietary supplements or alternative diets without your cancer doctor's help.  When to Call the Doctor  Call your doctor or nurse if you have any of the following symptoms and/or any new or unusual symptoms:  . Fever of 100.4 F (38 C) or above  . Chills  . Redness, pain, warmth, or swelling at the IV site during the infusion  . Signs of allergic reaction: swelling of the face, feeling like your tongue or throat are swelling, trouble breathing, rash, itching, fever, chills, feeling dizzy, and/or feeling that your heart is beating in a fast or not normal way  . Feeling that your heart is beating in a fast or not normal way (palpitations)  . Weight gain of 5 pounds in one week (fluid retention)  . Decreased urine or very dark urine  . Signs of liver problems: dark urine, pale bowel movements, bad stomach pain, feeling very tired and weak, unusual  itching, or yellowing of the eyes or skin  . Heavy menstrual period that lasts longer than normal  . Easy bruising or bleeding  . Nausea that stops you from eating or drinking, and/or that is not relieved by prescribed medicines.  . Loose bowel movements  (diarrhea) more than 4 times a day or diarrhea with weakness or lightheadedness  . Pain in your mouth or throat that makes it hard to eat or drink  . Lasting loss of appetite or rapid weight loss of five pounds in a week  . Signs of peripheral neuropathy: numbness, tingling, or decreased feeling in fingers or toes; trouble walking or changes in the way you walk; or feeling clumsy when buttoning clothes, opening jars, or other routine activities  . Joint and muscle pain that is not relieved by prescribed medicines  . Extreme fatigue that interferes with normal activities  . While you are getting this drug, please tell your nurse right away if you have any pain, redness, or swelling at the site of the IV infusion.  . If you think you are pregnant.  Reproduction Warnings  . Pregnancy warning: This drug may have harmful effects on the unborn child, it is recommended that effective methods of birth control should be used during your cancer treatment. Let your doctor know right away if you think you may be pregnant.  . Breast feeding warning: Women should not breast feed during treatment because this drug could enter the breastmilk and cause harm to a breast feeding baby.   Carboplatin (Paraplatin, CBDCA)  About This Drug  Carboplatin is used to treat cancer. It is given in the vein through your port a cath.  It will take 30 minutes to infuse. You will receive this medication every 3 weeks.   Possible Side Effects  . Bone marrow suppression. This is a decrease in the number of white blood cells, red blood cells, and platelets. This may raise your risk of infection, make you tired and weak (fatigue), and raise your risk of bleeding.  . Nausea and vomiting (throwing up)  . Weakness  . Changes in your liver function  . Changes in your kidney function  . Electrolyte changes  . Pain  Note: Each of the side effects above was reported in 20% or greater of patients treated with  carboplatin. Not all possible side effects are included above.   Warnings and Precautions  . Severe bone marrow suppression  . Allergic reactions, including anaphylaxis are rare but may happen in some patients. Signs of allergic reaction to this drug may be swelling of the face, feeling like your tongue or throat are swelling, trouble breathing, rash,  itching, fever, chills, feeling dizzy, and/or feeling that your heart is beating in a fast or not normal way. If this happens, do not take another dose of this drug. You should get urgent medical treatment.  . Severe nausea and vomiting  . Effects on the nerves are called peripheral neuropathy. This risk is increased if you are over the age of 48 or if you have received other medicine with risk of peripheral neuropathy. You may feel numbness, tingling, or pain in your hands and feet. It may be hard for you to button your clothes, open jars, or walk as usual. The effect on the nerves may get worse with more doses of the drug. These effects get better in some people after the drug is stopped but it does not get better in all people.  Marland Kitchen Blurred vision, loss of vision or other changes in eyesight  . Decreased hearing  . Skin and tissue irritation including redness, pain, warmth, or swelling at the IV site if the drug leaks out of the vein and into nearby tissue.  . Severe changes in your kidney function, which can cause kidney failure  . Severe changes in your liver function, which can cause liver failure  Note: Some of the side effects above are very rare. If you have concerns and/or questions, please discuss them with your medical team.  Important Information  . This drug may be present in the saliva, tears, sweat, urine, stool, vomit, semen, and vaginal secretions. Talk to your doctor and/or your nurse about the necessary precautions to take during this time.  Treating Side Effects  . Manage tiredness by pacing your activities for the  day.  . Be sure to include periods of rest between energy-draining activities.  . To decrease the risk of infection, wash your hands regularly.  . Avoid close contact with people who have a cold, the flu, or other infections.  . Take your temperature as your doctor or nurse tells you, and whenever you feel like you may have a fever.  . To help decrease the risk of bleeding, use a soft toothbrush. Check with your nurse before using dental floss.  . Be very careful when using knives or tools.  . Use an electric shaver instead of a razor.  . Drink plenty of fluids (a minimum of eight glasses per day is recommended).  . If you throw up or have loose bowel movements, you should drink more fluids so that you do not become dehydrated (lack of water in the body from losing too much fluid).  . To help with nausea and vomiting, eat small, frequent meals instead of three large meals a day.  Choose foods and drinks that are at room temperature. Ask your nurse or doctor about other helpful tips and medicine that is available to help stop or lessen these symptoms.  . If you have numbness and tingling in your hands and feet, be careful when cooking, walking, and handling sharp objects and hot liquids.  Marland Kitchen Keeping your pain under control is important to your well-being. Please tell your doctor or nurse if you are experiencing pain.  Food and Drug Interactions  . There are no known interactions of carboplatin with food.  . This drug may interact with other medicines. Tell your doctor and pharmacist about all the prescription and over-the-counter medicines and dietary supplements (vitamins, minerals, herbs and others) that you are taking at this time. Also, check with your doctor or pharmacist before starting any new  prescription or over-the-counter medicines, or dietary supplements to make sure that there are no interactions.  When to Call the Doctor  Call your doctor or nurse if you have any of  these symptoms and/or any new or unusual symptoms:  . Fever of 100.4 F (38 C) or higher  . Chills  . Tiredness that interferes with your daily activities  . Feeling dizzy or lightheaded  . Easy bleeding or bruising  . Nausea that stops you from eating or drinking and/or is not relieved by prescribed medicines  . Throwing up/vomiting  . Blurred vision or other changes in eyesight  . Decrease in hearing or ringing in the ear  . Signs of allergic reaction: swelling of the face, feeling like your tongue or throat are swelling, trouble breathing, rash, itching, fever, chills, feeling dizzy, and/or feeling that your heart is beating in a fast or not normal way. If this happens, call 911 for emergency care.  . While you are getting this drug, please tell your nurse right away if you have any pain, redness, or swelling at the site of the IV infusion  . Signs of possible liver problems: dark urine, pale bowel movements, bad stomach pain, feeling very tired and weak, unusual itching, or yellowing of the eyes or skin  . Decreased urine, or very dark urine  . Numbness, tingling, or pain in your hands and feet  . Pain that does not go away or is not relieved by prescribed medicine  . If you think you may be pregnant  Reproduction Warnings  . Pregnancy warning: This drug may have harmful effects on the unborn baby. Women of child bearing potential should use effective methods of birth control during your cancer treatment. Let your doctor know right away if you think you may be pregnant.  . Breastfeeding warning: It is not known if this drug passes into breast milk. For this reason, women should not breastfeed during treatment because this drug could enter the breast milk and cause harm to a breastfeeding baby.  . Fertility warning: Human fertility studies have not been done with this drug. Talk with your doctor or nurse if you plan to have children. Ask for information on sperm or egg  banking.  SELF CARE ACTIVITIES WHILE RECEIVING CHEMOTHERAPY:  Hydration Increase your fluid intake 48 hours prior to treatment and drink at least 8 to 12 cups (64 ounces) of water/decaffeinated beverages per day after treatment. You can still have your cup of coffee or soda but these beverages do not count as part of your 8 to 12 cups that you need to drink daily. No alcohol intake.  Medications Continue taking your normal prescription medication as prescribed.  If you start any new herbal or new supplements please let us know first to make sure it is safe.  Mouth Care Have teeth cleaned professionally before starting treatment. Keep dentures and partial plates clean. Use soft toothbrush and do not use mouthwashes that contain alcohol. Biotene is a good mouthwash that is available at most pharmacies or may be ordered by calling [phone]. Use warm salt water gargles (1 teaspoon salt per 1 quart warm water) before and after meals and at bedtime. If you need dental work, please let the doctor know before you go for your appointment so that we can coordinate the best possible time for you in regards to your chemo regimen. You need to also let your dentist know that you are actively taking chemo. We may need to  do labs prior to your dental appointment.  Skin Care Always use sunscreen that has not expired and with SPF (Sun Protection Factor) of 50 or higher. Wear hats to protect your head from the sun. Remember to use sunscreen on your hands, ears, face, & feet.  Use good moisturizing lotions such as udder cream, eucerin, or even Vaseline. Some chemotherapies can cause dry skin, color changes in your skin and nails.    . Avoid long, hot showers or baths. . Use gentle, fragrance-free soaps and laundry detergent. . Use moisturizers, preferably creams or ointments rather than lotions because the thicker consistency is better at preventing skin dehydration. Apply the cream or ointment within 15 minutes  of showering. Reapply moisturizer at night, and moisturize your hands every time after you wash them.  Hair Loss (if your doctor says your hair will fall out)  . If your doctor says that your hair is likely to fall out, decide before you begin chemo whether you want to wear a wig. You may want to shop before treatment to match your hair color. . Hats, turbans, and scarves can also camouflage hair loss, although some people prefer to leave their heads uncovered. If you go bare-headed outdoors, be sure to use sunscreen on your scalp. . Cut your hair short. It eases the inconvenience of shedding lots of hair, but it also can reduce the emotional impact of watching your hair fall out. . Don't perm or color your hair during chemotherapy. Those chemical treatments are already damaging to hair and can enhance hair loss. Once your chemo treatments are done and your hair has grown back, it's OK to resume dyeing or perming hair.  With chemotherapy, hair loss is almost always temporary. But when it grows back, it may be a different color or texture. In older adults who still had hair color before chemotherapy, the new growth may be completely gray.  Often, new hair is very fine and soft.  Infection Prevention Please wash your hands for at least 30 seconds using warm soapy water. Handwashing is the #1 way to prevent the spread of germs. Stay away from sick people or people who are getting over a cold. If you develop respiratory systems such as green/yellow mucus production or productive cough or persistent cough let us know and we will see if you need an antibiotic. It is a good idea to keep a pair of gloves on when going into grocery stores/[employer] to decrease your risk of coming into contact with germs on the carts, etc. Carry alcohol hand gel with you at all times and use it frequently if out in public. If your temperature reaches 100.4 or higher please call the clinic and let us know.  If it is after hours or on  the weekend please go to the ER if your temperature is over 100.4.  Please have your own personal thermometer at home to use.    Sex and bodily fluids If you are going to have sex, a condom must be used to protect the person that isn't taking chemotherapy. Chemo can decrease your libido (sex drive). For a few days after chemotherapy, chemotherapy can be excreted through your bodily fluids.  When using the toilet please close the lid and flush the toilet twice.  Do this for a few day after you have had chemotherapy.   Effects of chemotherapy on your sex life Some changes are simple and won't last long. They won't affect your sex life permanently.  Sometimes  you may feel: . too tired . not strong enough to be very active . sick or sore  . not in the mood . anxious or low  Your anxiety might not seem related to sex. For example, you may be worried about the cancer and how your treatment is going. Or you may be worried about money, or about how you family are coping with your illness.  These things can cause stress, which can affect your interest in sex. It's important to talk to your partner about how you feel.  Remember - the changes to your sex life don't usually last long. There's usually no medical reason to stop having sex during chemo. The drugs won't have any long term physical effects on your performance or enjoyment of sex. Cancer can't be passed on to your partner during sex  Contraception It's important to use reliable contraception during treatment. Avoid getting pregnant while you or your partner are having chemotherapy. This is because the drugs may harm the baby. Sometimes chemotherapy drugs can leave a man or woman infertile.  This means you would not be able to have children in the future. You might want to talk to someone about permanent infertility. It can be very difficult to learn that you may no longer be able to have children. Some people find counselling helpful. There might be  ways to preserve your fertility, although this is easier for men than for women. You may want to speak to a fertility expert. You can talk about sperm banking or harvesting your eggs. You can also ask about other fertility options, such as donor eggs. If you have or have had breast cancer, your doctor might advise you not to take the contraceptive pill. This is because the hormones in it might affect the cancer. It is not known for sure whether or not chemotherapy drugs can be passed on through semen or secretions from the vagina. Because of this some doctors advise people to use a barrier method if you have sex during treatment. This applies to vaginal, anal or oral sex. Generally, doctors advise a barrier method only for the time you are actually having the treatment and for about a week after your treatment. Advice like this can be worrying, but this does not mean that you have to avoid being intimate with your partner. You can still have close contact with your partner and continue to enjoy sex.  Animals If you have cats or birds we just ask that you not change the litter or change the cage.  Please have someone else do this for you while you are on chemotherapy.   Food Safety During and After Cancer Treatment Food safety is important for people both during and after cancer treatment. Cancer and cancer treatments, such as chemotherapy, radiation therapy, and stem cell/bone marrow transplantation, often weaken the immune system. This makes it harder for your body to protect itself from foodborne illness, also called food poisoning. Foodborne illness is caused by eating food that contains harmful bacteria, parasites, or viruses.  Foods to avoid Some foods have a higher risk of becoming tainted with bacteria. These include: Marland Kitchen Unwashed fresh fruit and vegetables, especially leafy vegetables that can hide dirt and other contaminants . Raw sprouts, such as alfalfa sprouts . Raw or undercooked beef,  especially ground beef, or other raw or undercooked meat and poultry . Fatty, fried, or spicy foods immediately before or after treatment.  These can sit heavy on your stomach and make you  feel nauseous. . Raw or undercooked shellfish, such as oysters. . Sushi and sashimi, which often contain raw fish.  . Unpasteurized beverages, such as unpasteurized fruit juices, raw milk, raw yogurt, or cider . Undercooked eggs, such as soft boiled, over easy, and poached; raw, unpasteurized eggs; or foods made with raw egg, such as homemade raw cookie dough and homemade mayonnaise  Simple steps for food safety  Shop smart. . Do not buy food stored or displayed in an unclean area. . Do not buy bruised or damaged fruits or vegetables. . Do not buy cans that have cracks, dents, or bulges. . Pick up foods that can spoil at the end of your shopping trip and store them in a cooler on the way home.  Prepare and clean up foods carefully. . Rinse all fresh fruits and vegetables under running water, and dry them with a clean towel or paper towel. . Clean the top of cans before opening them. . After preparing food, wash your hands for 20 seconds with hot water and soap. Pay special attention to areas between fingers and under nails. . Clean your utensils and dishes with hot water and soap. Marland Kitchen Disinfect your kitchen and cutting boards using 1 teaspoon of liquid, unscented bleach mixed into 1 quart of water.    Dispose of old food. . Eat canned and packaged food before its expiration date (the "use by" or "best before" date). . Consume refrigerated leftovers within 3 to 4 days. After that time, throw out the food. Even if the food does not smell or look spoiled, it still may be unsafe. Some bacteria, such as Listeria, can grow even on foods stored in the refrigerator if they are kept for too long.  Take precautions when eating out. . At restaurants, avoid buffets and salad bars where food sits out for a long time  and comes in contact with many people. Food can become contaminated when someone with a virus, often a norovirus, or another "bug" handles it. . Put any leftover food in a "to-go" container yourself, rather than having the server do it. And, refrigerate leftovers as soon as you get home. . Choose restaurants that are clean and that are willing to prepare your food as you order it cooked.   AT HOME MEDICATIONS:                                                                                                                                                                Compazine/Prochlorperazine 10mg  tablet. Take 1 tablet every 6 hours as needed for nausea/vomiting. (This can make you sleepy)   EMLA cream. Apply a quarter size amount to port site 1 hour prior to chemo. Do not rub in. Cover with plastic wrap.    Diarrhea  Sheet   If you are having loose stools/diarrhea, please purchase Imodium and begin taking as outlined:  At the first sign of poorly formed or loose stools you should begin taking Imodium (loperamide) 2 mg capsules.  Take two tablets (4mg ) followed by one tablet (2mg ) every 2 hours - DO NOT EXCEED 8 tablets in 24 hours.  If it is bedtime and you are having loose stools, take 2 tablets at bedtime, then 2 tablets every 4 hours until morning.   Always call the [facility] if you are having loose stools/diarrhea that you can't get under control.  Loose stools/diarrhea leads to dehydration (loss of water) in your body.  We have other options of trying to get the loose stools/diarrhea to stop but you must let us know!   Constipation Sheet  Colace - 100 mg capsules - take 2 capsules daily.  If this doesn't help then you can increase to 2 capsules twice daily.  Please call if the above does not work for you. Do not go more than 2 days without a bowel movement.  It is very important that you do not become constipated.  It will make you feel sick to your stomach (nausea) and can cause  abdominal pain and vomiting.  Nausea Sheet   Compazine/Prochlorperazine 10mg  tablet. Take 1 tablet every 6 hours as needed for nausea/vomiting (This can make you drowsy).  If you are having persistent nausea (nausea that does not stop) please call the [facility] and let us know the amount of nausea that you are experiencing.  If you begin to vomit, you need to call the [facility] and if it is the weekend and you have vomited more than one time and can't get it to stop-go to the Emergency Room.  Persistent nausea/vomiting can lead to dehydration (loss of fluid in your body) and will make you feel very weak and unwell. Ice chips, sips of clear liquids, foods that are at room temperature, crackers, and toast tend to be better tolerated.   SYMPTOMS TO REPORT AS SOON AS POSSIBLE AFTER TREATMENT:  FEVER GREATER THAN 100.4 F  CHILLS WITH OR WITHOUT FEVER  NAUSEA AND VOMITING THAT IS NOT CONTROLLED WITH YOUR NAUSEA MEDICATION  UNUSUAL SHORTNESS OF BREATH  UNUSUAL BRUISING OR BLEEDING  TENDERNESS IN MOUTH AND THROAT WITH OR WITHOUT PRESENCE OF ULCERS  URINARY PROBLEMS  BOWEL PROBLEMS  UNUSUAL RASH      Wear comfortable clothing and clothing appropriate for easy access to any Portacath or PICC line. Let us know if there is anything that we can do to make your therapy better!    What to do if you need assistance after hours or on the weekends: CALL [phone].  HOLD on the line, do not hang up.  You will hear multiple messages but at the end you will be connected with a nurse triage line.  They will contact the doctor if necessary.  Most of the time they will be able to assist you.  Do not call the hospital operator.      I have been informed and understand all of the instructions given to me and have received a copy. I have been instructed to call the clinic [phone] or my family physician as soon as possible for continued medical care, if indicated. I do not have any  more questions at this time but understand that I may call the [facility] or the Patient Navigator at [phone] during office hours should I  have questions or need assistance in obtaining follow-up care.

## 2019-07-25 ENCOUNTER — Other Ambulatory Visit: Payer: Self-pay | Admitting: Radiology

## 2019-07-26 ENCOUNTER — Other Ambulatory Visit: Payer: Self-pay

## 2019-07-26 ENCOUNTER — Ambulatory Visit (HOSPITAL_COMMUNITY)
Admission: RE | Admit: 2019-07-26 | Discharge: 2019-07-26 | Disposition: A | Discharge status: Home / Self Care | Payer: Medicare Other | Source: Ambulatory Visit | Attending: Nurse Practitioner | Admitting: Nurse Practitioner

## 2019-07-26 DIAGNOSIS — Z79899 Other long term (current) drug therapy: Secondary | ICD-10-CM | POA: Diagnosis not present

## 2019-07-26 DIAGNOSIS — E1140 Type 2 diabetes mellitus with diabetic neuropathy, unspecified: Secondary | ICD-10-CM | POA: Insufficient documentation

## 2019-07-26 DIAGNOSIS — F419 Anxiety disorder, unspecified: Secondary | ICD-10-CM | POA: Diagnosis not present

## 2019-07-26 DIAGNOSIS — E785 Hyperlipidemia, unspecified: Secondary | ICD-10-CM | POA: Insufficient documentation

## 2019-07-26 DIAGNOSIS — I10 Essential (primary) hypertension: Secondary | ICD-10-CM | POA: Diagnosis not present

## 2019-07-26 DIAGNOSIS — Z7984 Long term (current) use of oral hypoglycemic drugs: Secondary | ICD-10-CM | POA: Diagnosis not present

## 2019-07-26 DIAGNOSIS — C569 Malignant neoplasm of unspecified ovary: Secondary | ICD-10-CM | POA: Insufficient documentation

## 2019-07-26 DIAGNOSIS — G4730 Sleep apnea, unspecified: Secondary | ICD-10-CM | POA: Diagnosis not present

## 2019-07-26 DIAGNOSIS — Z452 Encounter for adjustment and management of vascular access device: Secondary | ICD-10-CM | POA: Diagnosis not present

## 2019-07-26 HISTORY — PX: IR IMAGING GUIDED PORT INSERTION: IMG5740

## 2019-07-26 LAB — GLUCOSE, CAPILLARY: Glucose-Capillary: 121 mg/dL — ABNORMAL HIGH (ref 70–99)

## 2019-07-26 MED ORDER — HEPARIN SOD (PORK) LOCK FLUSH 100 UNIT/ML IV SOLN
INTRAVENOUS | Status: AC
  Filled 2019-07-26: qty 5

## 2019-07-26 MED ORDER — MIDAZOLAM HCL 2 MG/2ML IJ SOLN
INTRAMUSCULAR | Status: AC
  Filled 2019-07-26: qty 2

## 2019-07-26 MED ORDER — MIDAZOLAM HCL 2 MG/2ML IJ SOLN
INTRAMUSCULAR | Status: AC | PRN
  Administered 2019-07-26: 1 mg via INTRAVENOUS

## 2019-07-26 MED ORDER — FENTANYL CITRATE (PF) 100 MCG/2ML IJ SOLN
INTRAMUSCULAR | Status: AC
  Filled 2019-07-26: qty 2

## 2019-07-26 MED ORDER — VANCOMYCIN HCL IN DEXTROSE 1-5 GM/200ML-% IV SOLN
INTRAVENOUS | Status: AC
  Administered 2019-07-26: 1000 mg via INTRAVENOUS
  Filled 2019-07-26: qty 200

## 2019-07-26 MED ORDER — FENTANYL CITRATE (PF) 100 MCG/2ML IJ SOLN
INTRAMUSCULAR | Status: AC | PRN
  Administered 2019-07-26: 50 ug via INTRAVENOUS

## 2019-07-26 MED ORDER — VANCOMYCIN HCL IN DEXTROSE 1-5 GM/200ML-% IV SOLN: 1000.0000 mg | INTRAVENOUS | Status: AC

## 2019-07-26 MED ORDER — LIDOCAINE HCL 1 % IJ SOLN
INTRAMUSCULAR | Status: AC | PRN
  Administered 2019-07-26: 20 mL

## 2019-07-26 MED ORDER — LIDOCAINE HCL 1 % IJ SOLN
INTRAMUSCULAR | Status: AC
  Filled 2019-07-26: qty 20

## 2019-07-26 MED ORDER — SODIUM CHLORIDE 0.9 % IV SOLN: INTRAVENOUS | Status: DC

## 2019-07-26 NOTE — H&P (Signed)
Chief Complaint: Patient was seen in consultation today for tunneled central venous catheter with port placement.  Referring Physician(s): Glennie Isle  Supervising Physician: Corrie Mckusick  Patient Status: [facility] - Out-pt  History of Present Illness: Laura [name] is a 74 y.o. female with a past medical history of significant for anxiety, sleep apnea, arthritis, HLD, HTN and ovarian cancer followed by Dr. Delton Coombes who presents today for port placement. Laura [name] presented to the ED on 04/02/19 with complaints of worsening gas, bloating and RLQ pain. A CT abd/pelvis w/contrast was ordered which noted retroperitoneal lesions concerning for nodal mass lesions or metastases. She was referred to oncology as an outpatient and then underwent a PET scan on 05/17/19 which noted hypermetabolic activity in the previously noted retroperitoneal masses. 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 Recommendations have been made to proceed with systemic chemotherapy and IR has been asked to place a port for durable venous access.  Laura [name] tells me that she was feeling well overall until March of this year - she had previously been diagnosed with IBS due to alternating constipation and diarrhea however she began to have gas and bloating along with RLQ pain which brought her to the ED where the peritoneal masses were found. She was seen by Dr. Denman George and Dr. Delton Coombes and the recommendation was made to proceed with systemic therapy. She tells me she was hesitant to pursue chemotherapy because she was interested in preserving her quality of life as she feels very good overall, however her family members really wanted her to try treatment so she has agreed to proceed. She is agreeable to having a port placed today to facility chemotherapy.  Past Medical History:  Diagnosis Date  . Anxiety   . Arthritis    back of neck, bones spurs on neck  .  Cataract   . Cervical disc disease   . Diabetes mellitus   . Family history of thyroid cancer   . Hyperlipidemia   . Hypertension   . Mucoid cyst of joint    right thumb  . Neuropathy   . Port-A-Cath in place 07/21/2019  . Reflux   . Sleep apnea    wears CPAP nightly  . Vertigo     Past Surgical History:  Procedure Laterality Date  . BLADDER SURGERY    . BREAST EXCISIONAL BIOPSY Bilateral   . BREAST SURGERY    . CHOLECYSTECTOMY    . fibroids removed     breast (both breasts)  . MASS EXCISION Right 06/26/2016   Procedure: EXCISION MUCOID TUMOR RIGHT THUMB, IP RIGHT THUMB;  Surgeon: Daryll Brod, MD;  Location: [facility];  Service: Orthopedics;  Laterality: Right;  . PARTIAL HYSTERECTOMY      Allergies: Hydrocodone-homatropine, Sulfa antibiotics, Augmentin [amoxicillin-pot clavulanate], Codeine, Crestor [rosuvastatin calcium], Doxycycline, Keflex [cephalexin], Naproxen, Statins, and Prednisone  Medications: Prior to Admission medications   Medication Sig Start Date End Date Taking? Authorizing Provider  acetaminophen (TYLENOL) 500 MG tablet Take 500 mg by mouth 2 (two) times daily as needed for moderate pain or headache.   Yes [provider]  amLODipine (NORVASC) 5 MG tablet TAKE ONE (1) TABLET BY MOUTH EVERY DAY Patient taking differently: Take 5 mg by mouth daily.  02/23/19  Yes Biagio Borg, MD  cetirizine (ZYRTEC) 10 MG tablet Take 10 mg by mouth daily.   Yes [provider]  cholecalciferol (VITAMIN D3) 25 MCG (1000  UNIT) tablet Take 2,000 Units by mouth daily.   Yes [provider]  glimepiride (AMARYL) 2 MG tablet TAKE ONETABLET BY MOUTH DAILY Patient taking differently: Take 2 mg by mouth daily.  08/25/18  Yes Biagio Borg, MD  losartan-hydrochlorothiazide (HYZAAR) 100-12.5 MG tablet Take 1 tablet by mouth daily. 07/11/19  Yes Biagio Borg, MD  morphine (MSIR) 15 MG tablet Take 1 tablet (15 mg total) by mouth 2 (two) times daily  as needed for severe pain. Patient taking differently: Take 15 mg by mouth daily as needed for moderate pain.  07/01/19  Yes Lockamy, Randi L, NP-C  OVER THE COUNTER MEDICATION Apply 1 application topically daily as needed (pain). Triderma otc pain cream   Yes [provider]  pantoprazole (PROTONIX) 40 MG tablet TAKE 1 TABLET (40 MG TOTAL) BY MOUTH DAILY. Patient taking differently: Take 40 mg by mouth daily as needed (acid reflux).  03/21/19 03/20/20 Yes Biagio Borg, MD  sennosides-docusate sodium (SENOKOT-S) 8.6-50 MG tablet Take 2 tablets by mouth daily.   Yes [provider]  vitamin B-12 (CYANOCOBALAMIN) 1000 MCG tablet Take 2,000 mcg by mouth daily.   Yes [provider]  Blood Glucose Monitoring Suppl (ONE TOUCH ULTRA 2) w/Device KIT Use as directed 04/06/15   Biagio Borg, MD  CARBOPLATIN IV Inject into the vein every 21 ( twenty-one) days. 07/28/19   [provider]  Lactulose 20 GM/30ML SOLN Take 30 mLs (20 g total) by mouth at bedtime. Patient not taking: Reported on 07/20/2019 06/15/19   Derek Jack, MD  Lancets [facility] ULTRASOFT) lancets USE TO CHECK BLOOD SUGAR TWICE DAILY 06/27/19   Biagio Borg, MD  lidocaine-prilocaine (EMLA) cream Apply a small amount to port a cath site and cover with plastic wrap 1 hour prior to chemotherapy appointments 07/21/19   Derek Jack, MD  loperamide (IMODIUM) 2 MG capsule Take by mouth as needed for diarrhea or loose stools.    [provider]  [facility] ULTRA test strip USE TO CHECK BLOOD SUGARS TWO TIMES DAILY 06/27/19   Biagio Borg, MD  PACLITAXEL IV Inject 175 mg/m2 into the vein every 21 ( twenty-one) days. 07/28/19   [provider]  Pegfilgrastim (NEULASTA ONPRO [place]) Inject into the skin every 21 ( twenty-one) days. 07/28/19   [provider]  prochlorperazine (COMPAZINE) 10 MG tablet Take 1 tablet (10 mg total) by mouth every 6 (six) hours as needed (Nausea or vomiting).  07/21/19   Derek Jack, MD     Family History  Problem Relation Age of Onset  . Hypertension Other   . Diabetes Father   . Hypertension Father   . Diabetes Sister   . Hypertension Mother   . Hypertension Sister   . Stroke Sister   . Diabetes Maternal Aunt   . Thyroid cancer Daughter 34  . Colon cancer Neg Hx   . Esophageal cancer Neg Hx   . Stomach cancer Neg Hx   . Rectal cancer Neg Hx   . Breast cancer Neg Hx   . Endometrial cancer Neg Hx   . Ovarian cancer Neg Hx     Social History   Socioeconomic History  . Marital status: Divorced    Spouse name: Not on file  . Number of children: 4  . Years of education: 59  . Highest education level: Not on file  Occupational History  . Occupation: retired Geographical information systems officer  Tobacco Use  . Smoking status: Never Smoker  .  Smokeless tobacco: Never Used  Vaping Use  . Vaping Use: Never used  Substance and Sexual Activity  . Alcohol use: No    Alcohol/week: 0.0 standard drinks  . Drug use: No  . Sexual activity: Not Currently  Other Topics Concern  . Not on file  Social History Narrative  . Not on file   Social Determinants of Health   Financial Resource Strain: Low Risk   . Difficulty of Paying Living Expenses: Not hard at all  Food Insecurity: No Food Insecurity  . Worried About Charity fundraiser in the Last Year: Never true  . Ran Out of Food in the Last Year: Never true  Transportation Needs: No Transportation Needs  . Lack of Transportation (Medical): No  . Lack of Transportation (Non-Medical): No  Physical Activity: Inactive  . Days of Exercise per Week: 0 days  . Minutes of Exercise per Session: 0 min  Stress: No Stress Concern Present  . Feeling of Stress : Not at all  Social Connections: Moderately Integrated  . Frequency of Communication with Friends and Family: More than three times a week  . Frequency of Social Gatherings with Friends and Family: More than three times a week  . Attends  Religious Services: More than 4 times per year  . Active Member of Clubs or Organizations: Yes  . Attends Archivist Meetings: Never  . Marital Status: Divorced     Review of Systems: A 12 point ROS discussed and pertinent positives are indicated in the HPI above.  All other systems are negative.  Review of Systems  Constitutional: Negative for appetite change, chills and fever.  Respiratory: Negative for cough and shortness of breath.   Cardiovascular: Negative for chest pain.  Gastrointestinal: Positive for constipation. Negative for abdominal pain, diarrhea, nausea and vomiting.  Musculoskeletal: Negative for back pain.  Neurological: Negative for dizziness and headaches.    Vital Signs: BP 135/62   Pulse 75   Temp 98 F (36.7 C)   Resp 14   Ht 5' 4"  (1.626 m)   Wt 193 lb (87.5 kg)   SpO2 99%   BMI 33.13 kg/m   Physical Exam Vitals reviewed.  Constitutional:      General: She is not in acute distress. HENT:     Head: Normocephalic.     Mouth/Throat:     Mouth: Mucous membranes are moist.     Pharynx: Oropharynx is clear. No oropharyngeal exudate or posterior oropharyngeal erythema.  Cardiovascular:     Rate and Rhythm: Normal rate and regular rhythm.  Pulmonary:     Effort: Pulmonary effort is normal.     Breath sounds: Normal breath sounds.  Abdominal:     General: There is no distension.     Palpations: Abdomen is soft.     Tenderness: There is no abdominal tenderness.  Skin:    General: Skin is warm and dry.  Neurological:     Mental Status: She is alert and oriented to person, place, and time.  Psychiatric:        Mood and Affect: Mood normal.        Behavior: Behavior normal.        Thought Content: Thought content normal.        Judgment: Judgment normal.      MD Evaluation Airway: WNL Heart: WNL Abdomen: WNL Chest/ Lungs: WNL ASA  Classification: 3 Mallampati/Airway Score: Two   Imaging: No results  found.  Labs:  CBC: Recent Labs  04/03/19 1820 04/04/19 0501 04/05/19 0752 06/06/19 0944  WBC 11.6* 11.8* 8.8 8.2  HGB 12.7 12.0 12.0 12.5  HCT 37.9 37.6 37.2 38.0  PLT 243 234 248 256    COAGS: Recent Labs    06/06/19 0944  INR 1.0    BMP: Recent Labs    08/25/18 1551 04/02/19 2100 04/03/19 1820 06/06/19 0944  NA 140 136 136 141  K 4.1 3.9 3.6 4.2  CL 106 101 102 108  CO2 25 23 24 22   GLUCOSE 111* 186* 180* 166*  BUN 13 13 14 13   CALCIUM 10.2 9.8 9.7 9.5  CREATININE 0.90 0.69 0.82 0.79  GFRNONAA  --  >60 >60 >60  GFRAA  --  >60 >60 >60    LIVER FUNCTION TESTS: Recent Labs    08/25/18 1551 04/02/19 2100 04/03/19 1820  BILITOT 0.3 0.5 0.5  AST 16 16 18   ALT 16 15 14   ALKPHOS 81 82 75  PROT 7.7 8.1 7.8  ALBUMIN 4.8 4.8 4.4    TUMOR MARKERS: No results for input(s): AFPTM, CEA, CA199, CHROMGRNA in the last 8760 hours.  Assessment and Plan:  74 y/o F with recently diagnosed ovarian cancer followed by Dr. Delton Coombes who presents today to have a port placed to begin systemic chemotherapy.  Patient has been NPO since midnight, no current antiplatelet/anticoagulation medications.   Risks and benefits of image-guided Port-a-catheter placement were discussed with the patient including, but not limited to bleeding, infection, pneumothorax, or fibrin sheath development and need for additional procedures.  All of the patient's questions were answered, patient is agreeable to proceed.  Consent signed and in chart.  Thank you for this interesting consult.  I greatly enjoyed meeting Laura [name] and look forward to participating in their care.  A copy of this report was sent to the requesting provider on this date.  Electronically Signed: Joaquim Nam, PA-C 07/26/2019, 10:58 AM   I spent a total of  30 Minutes  in face to face in clinical consultation, greater than 50% of which was counseling/coordinating care for port placement.

## 2019-07-26 NOTE — Procedures (Signed)
Interventional Radiology Procedure Note  Procedure: Placement of a right IJ approach single lumen PowerPort.  Tip is positioned at the superior cavoatrial junction and catheter is ready for immediate use.  Complications: None Recommendations:  - Ok to shower tomorrow - Do not submerge for 7 days - Routine line care   Signed,  Ieesha Abbasi S. Vikkie Goeden, DO   

## 2019-07-26 NOTE — Discharge Instructions (Addendum)
Implanted Port Insertion, Care After This sheet gives you information about how to care for yourself after your procedure. Your health care provider may also give you more specific instructions. If you have problems or questions, contact your health care provider. What can I expect after the procedure? After the procedure, it is common to have:  Discomfort at the port insertion site.  Bruising on the skin over the port. This should improve over 3-4 days. Follow these instructions at home: Port care  After your port is placed, you will get a manufacturer's information card. The card has information about your port. Keep this card with you at all times.  Take care of the port as told by your health care provider. Ask your health care provider if you or a family member can get training for taking care of the port at home. A home health care nurse may also take care of the port.  Make sure to remember what type of port you have. Incision care      Follow instructions from your health care provider about how to take care of your port insertion site. Make sure you: ? Wash your hands with soap and water before and after you change your bandage (dressing). If soap and water are not available, use hand sanitizer. ? Change your dressing as told by your health care provider. ? Leave stitches (sutures), skin glue, or adhesive strips in place. These skin closures may need to stay in place for 2 weeks or longer. If adhesive strip edges start to loosen and curl up, you may trim the loose edges. Do not remove adhesive strips completely unless your health care provider tells you to do that.  Check your port insertion site every day for signs of infection. Check for: ? Redness, swelling, or pain. ? Fluid or blood. ? Warmth. ? Pus or a bad smell. Activity  Return to your normal activities as told by your health care provider. Ask your health care provider what activities are safe for you.  Do not  lift anything that is heavier than 10 lb (4.5 kg), or the limit that you are told, until your health care provider says that it is safe. General instructions  Take over-the-counter and prescription medicines only as told by your health care provider.  Do not take baths, swim, or use a hot tub until your health care provider approves. Ask your health care provider if you may take showers. You may only be allowed to take sponge baths.  Do not drive for 24 hours if you were given a sedative during your procedure.  Wear a medical alert bracelet in case of an emergency. This will tell any health care providers that you have a port.  Keep all follow-up visits as told by your health care provider. This is important. Contact a health care provider if:  You cannot flush your port with saline as directed, or you cannot draw blood from the port.  You have a fever or chills.  You have redness, swelling, or pain around your port insertion site.  You have fluid or blood coming from your port insertion site.  Your port insertion site feels warm to the touch.  You have pus or a bad smell coming from the port insertion site. Get help right away if:  You have chest pain or shortness of breath.  You have bleeding from your port that you cannot control. Summary  Take care of the port as told by your health   care provider. Keep the manufacturer's information card with you at all times.  Change your dressing as told by your health care provider.  Contact a health care provider if you have a fever or chills or if you have redness, swelling, or pain around your port insertion site.  Keep all follow-up visits as told by your health care provider. This information is not intended to replace advice given to you by your health care provider. Make sure you discuss any questions you have with your health care provider. Document Revised: 07/28/2017 Document Reviewed: 07/28/2017 Elsevier Patient Education   2020 [employer] Moderate Conscious Sedation, Adult Sedation is the use of medicines to promote relaxation and relieve discomfort and anxiety. Moderate conscious sedation is a type of sedation. Under moderate conscious sedation, you are less alert than normal, but you are still able to respond to instructions, touch, or both. Moderate conscious sedation is used during short medical and dental procedures. It is milder than deep sedation, which is a type of sedation under which you cannot be easily woken up. It is also milder than general anesthesia, which is the use of medicines to make you unconscious. Moderate conscious sedation allows you to return to your regular activities sooner. Tell a health care provider about:  Any allergies you have.  All medicines you are taking, including vitamins, herbs, eye drops, creams, and over-the-counter medicines.  Use of steroids (by mouth or creams).  Any problems you or family members have had with sedatives and anesthetic medicines.  Any blood disorders you have.  Any surgeries you have had.  Any medical conditions you have, such as sleep apnea.  Whether you are pregnant or may be pregnant.  Any use of cigarettes, alcohol, marijuana, or street drugs. What are the risks? Generally, this is a safe procedure. However, problems may occur, including:  Getting too much medicine (oversedation).  Nausea.  Allergic reaction to medicines.  Trouble breathing. If this happens, a breathing tube may be used to help with breathing. It will be removed when you are awake and breathing on your own.  Heart trouble.  Lung trouble. What happens before the procedure? Staying hydrated Follow instructions from your health care provider about hydration, which may include:  Up to 2 hours before the procedure - you may continue to drink clear liquids, such as water, clear fruit juice, black coffee, and plain tea. Eating and drinking restrictions Follow  instructions from your health care provider about eating and drinking, which may include:  8 hours before the procedure - stop eating heavy meals or foods such as meat, fried foods, or fatty foods.  6 hours before the procedure - stop eating light meals or foods, such as toast or cereal.  6 hours before the procedure - stop drinking milk or drinks that contain milk.  2 hours before the procedure - stop drinking clear liquids. Medicine Ask your health care provider about:  Changing or stopping your regular medicines. This is especially important if you are taking diabetes medicines or blood thinners.  Taking medicines such as aspirin and ibuprofen. These medicines can thin your blood. Do not take these medicines before your procedure if your health care provider instructs you not to.  Tests and exams  You will have a physical exam.  You may have blood tests done to show: ? How well your kidneys and liver are working. ? How well your blood can clot. General instructions  Plan to have someone take you home from the   hospital or clinic.  If you will be going home right after the procedure, plan to have someone with you for 24 hours. What happens during the procedure?  An IV tube will be inserted into one of your veins.  Medicine to help you relax (sedative) will be given through the IV tube.  The medical or dental procedure will be performed. What happens after the procedure?  Your blood pressure, heart rate, breathing rate, and blood oxygen level will be monitored often until the medicines you were given have worn off.  Do not drive for 24 hours. This information is not intended to replace advice given to you by your health care provider. Make sure you discuss any questions you have with your health care provider. Document Revised: 12/12/2016 Document Reviewed: 04/21/2015 Elsevier Patient Education  2020 [employer] Inc.  

## 2019-07-28 ENCOUNTER — Inpatient Hospital Stay (HOSPITAL_BASED_OUTPATIENT_CLINIC_OR_DEPARTMENT_OTHER): Payer: Medicare Other | Admitting: Hematology

## 2019-07-28 ENCOUNTER — Inpatient Hospital Stay (HOSPITAL_COMMUNITY): Payer: Medicare Other

## 2019-07-28 ENCOUNTER — Encounter (HOSPITAL_COMMUNITY): Payer: Self-pay

## 2019-07-28 ENCOUNTER — Other Ambulatory Visit: Payer: Self-pay

## 2019-07-28 VITALS — BP 132/61 | HR 80 | Temp 97.0°F | Resp 20 | Wt 199.6 lb

## 2019-07-28 VITALS — BP 149/66 | HR 87 | Temp 96.7°F | Resp 18

## 2019-07-28 DIAGNOSIS — E785 Hyperlipidemia, unspecified: Secondary | ICD-10-CM | POA: Diagnosis not present

## 2019-07-28 DIAGNOSIS — R600 Localized edema: Secondary | ICD-10-CM | POA: Diagnosis not present

## 2019-07-28 DIAGNOSIS — K219 Gastro-esophageal reflux disease without esophagitis: Secondary | ICD-10-CM | POA: Diagnosis not present

## 2019-07-28 DIAGNOSIS — M545 Low back pain: Secondary | ICD-10-CM | POA: Diagnosis not present

## 2019-07-28 DIAGNOSIS — Z7984 Long term (current) use of oral hypoglycemic drugs: Secondary | ICD-10-CM | POA: Diagnosis not present

## 2019-07-28 DIAGNOSIS — I10 Essential (primary) hypertension: Secondary | ICD-10-CM | POA: Diagnosis not present

## 2019-07-28 DIAGNOSIS — Z5189 Encounter for other specified aftercare: Secondary | ICD-10-CM | POA: Diagnosis not present

## 2019-07-28 DIAGNOSIS — Z79899 Other long term (current) drug therapy: Secondary | ICD-10-CM | POA: Diagnosis not present

## 2019-07-28 DIAGNOSIS — R5383 Other fatigue: Secondary | ICD-10-CM | POA: Diagnosis not present

## 2019-07-28 DIAGNOSIS — R42 Dizziness and giddiness: Secondary | ICD-10-CM | POA: Diagnosis not present

## 2019-07-28 DIAGNOSIS — C569 Malignant neoplasm of unspecified ovary: Secondary | ICD-10-CM

## 2019-07-28 DIAGNOSIS — Z95828 Presence of other vascular implants and grafts: Secondary | ICD-10-CM

## 2019-07-28 DIAGNOSIS — R05 Cough: Secondary | ICD-10-CM | POA: Diagnosis not present

## 2019-07-28 DIAGNOSIS — Z5111 Encounter for antineoplastic chemotherapy: Secondary | ICD-10-CM | POA: Diagnosis not present

## 2019-07-28 DIAGNOSIS — Z9989 Dependence on other enabling machines and devices: Secondary | ICD-10-CM | POA: Diagnosis not present

## 2019-07-28 DIAGNOSIS — E1136 Type 2 diabetes mellitus with diabetic cataract: Secondary | ICD-10-CM | POA: Diagnosis not present

## 2019-07-28 DIAGNOSIS — G4730 Sleep apnea, unspecified: Secondary | ICD-10-CM | POA: Diagnosis not present

## 2019-07-28 DIAGNOSIS — K5900 Constipation, unspecified: Secondary | ICD-10-CM | POA: Diagnosis not present

## 2019-07-28 LAB — COMPREHENSIVE METABOLIC PANEL
ALT: 13 U/L (ref 0–44)
AST: 18 U/L (ref 15–41)
Albumin: 4.5 g/dL (ref 3.5–5.0)
Alkaline Phosphatase: 61 U/L (ref 38–126)
Anion gap: 7 (ref 5–15)
BUN: 17 mg/dL (ref 8–23)
CO2: 26 mmol/L (ref 22–32)
Calcium: 9.8 mg/dL (ref 8.9–10.3)
Chloride: 100 mmol/L (ref 98–111)
Creatinine, Ser: 1.01 mg/dL — ABNORMAL HIGH (ref 0.44–1.00)
GFR calc Af Amer: >60 mL/min (ref >60)
GFR calc non Af Amer: 55 mL/min — ABNORMAL LOW (ref >60)
Glucose, Bld: 114 mg/dL — ABNORMAL HIGH (ref 70–99)
Potassium: 3.8 mmol/L (ref 3.5–5.1)
Sodium: 133 mmol/L — ABNORMAL LOW (ref 135–145)
Total Bilirubin: 0.4 mg/dL (ref 0.3–1.2)
Total Protein: 7.6 g/dL (ref 6.5–8.1)

## 2019-07-28 LAB — CBC WITH DIFFERENTIAL/PLATELET
Abs Immature Granulocytes: 0.02 10*3/uL (ref 0.00–0.07)
Basophils Absolute: 0 10*3/uL (ref 0.0–0.1)
Basophils Relative: 1 %
Eosinophils Absolute: 0.2 10*3/uL (ref 0.0–0.5)
Eosinophils Relative: 3 %
HCT: 35 % — ABNORMAL LOW (ref 36.0–46.0)
Hemoglobin: 11.5 g/dL — ABNORMAL LOW (ref 12.0–15.0)
Immature Granulocytes: 0 %
Lymphocytes Relative: 29 %
Lymphs Abs: 2.1 10*3/uL (ref 0.7–4.0)
MCH: 28.8 pg (ref 26.0–34.0)
MCHC: 32.9 g/dL (ref 30.0–36.0)
MCV: 87.5 fL (ref 80.0–100.0)
Monocytes Absolute: 0.6 10*3/uL (ref 0.1–1.0)
Monocytes Relative: 8 %
Neutro Abs: 4.3 10*3/uL (ref 1.7–7.7)
Neutrophils Relative %: 59 %
Platelets: 226 10*3/uL (ref 150–400)
RBC: 4 MIL/uL (ref 3.87–5.11)
RDW: 13.3 % (ref 11.5–15.5)
WBC: 7.3 10*3/uL (ref 4.0–10.5)
nRBC: 0 % (ref 0.0–0.2)

## 2019-07-28 MED ORDER — SODIUM CHLORIDE 0.9 % IV SOLN
150.0000 mg | Freq: Once | INTRAVENOUS | Status: AC
  Administered 2019-07-28: 150 mg via INTRAVENOUS
  Filled 2019-07-28: qty 150

## 2019-07-28 MED ORDER — PALONOSETRON HCL INJECTION 0.25 MG/5ML
0.2500 mg | Freq: Once | INTRAVENOUS | Status: AC
  Administered 2019-07-28: 0.25 mg via INTRAVENOUS
  Filled 2019-07-28: qty 5

## 2019-07-28 MED ORDER — SODIUM CHLORIDE 0.9% FLUSH
10.0000 mL | INTRAVENOUS | Status: DC | PRN
  Administered 2019-07-28: 10 mL

## 2019-07-28 MED ORDER — FAMOTIDINE IN NACL 20-0.9 MG/50ML-% IV SOLN
20.0000 mg | Freq: Once | INTRAVENOUS | Status: AC
  Administered 2019-07-28: 20 mg via INTRAVENOUS
  Filled 2019-07-28: qty 50

## 2019-07-28 MED ORDER — PEGFILGRASTIM 6 MG/0.6ML ~~LOC~~ PSKT
6.0000 mg | PREFILLED_SYRINGE | Freq: Once | SUBCUTANEOUS | Status: AC
  Administered 2019-07-28: 6 mg via SUBCUTANEOUS
  Filled 2019-07-28: qty 0.6

## 2019-07-28 MED ORDER — HEPARIN SOD (PORK) LOCK FLUSH 100 UNIT/ML IV SOLN
500.0000 [IU] | Freq: Once | INTRAVENOUS | Status: AC | PRN
  Administered 2019-07-28: 500 [IU]

## 2019-07-28 MED ORDER — DIPHENHYDRAMINE HCL 50 MG/ML IJ SOLN
50.0000 mg | Freq: Once | INTRAMUSCULAR | Status: AC
  Administered 2019-07-28: 50 mg via INTRAVENOUS
  Filled 2019-07-28: qty 1

## 2019-07-28 MED ORDER — FUROSEMIDE 20 MG PO TABS
20.0000 mg | ORAL_TABLET | Freq: Every day | ORAL | 2 refills | Status: DC
Start: 2019-07-28 — End: 2019-10-19

## 2019-07-28 MED ORDER — SODIUM CHLORIDE 0.9 % IV SOLN
140.0000 mg/m2 | Freq: Once | INTRAVENOUS | Status: AC
  Administered 2019-07-28: 282 mg via INTRAVENOUS
  Filled 2019-07-28: qty 47

## 2019-07-28 MED ORDER — SODIUM CHLORIDE 0.9 % IV SOLN
10.0000 mg | Freq: Once | INTRAVENOUS | Status: AC
  Administered 2019-07-28: 10 mg via INTRAVENOUS
  Filled 2019-07-28: qty 10

## 2019-07-28 MED ORDER — SODIUM CHLORIDE 0.9 % IV SOLN
470.0000 mg | Freq: Once | INTRAVENOUS | Status: AC
  Administered 2019-07-28: 470 mg via INTRAVENOUS
  Filled 2019-07-28: qty 47

## 2019-07-28 MED ORDER — SODIUM CHLORIDE 0.9 % IV SOLN: Freq: Once | INTRAVENOUS | Status: AC

## 2019-07-28 NOTE — Progress Notes (Signed)
Patient has been assessed, vital signs and labs have been reviewed by Dr. Katragadda. ANC, Creatinine, LFTs, and Platelets are within treatment parameters per Dr. Katragadda. The patient is good to proceed with treatment at this time.  

## 2019-07-28 NOTE — Patient Instructions (Signed)
[place] at [facility] Discharge Instructions  You were seen today by Dr. Delton Coombes. He went over your recent results. You received your treatment today. You will be prescribed furosemide to help with your swelling; take only as needed. Stop taking your amlodipine starting on 07/29/2019, but continue taking the losartan/HCTZ. The day after your treatment, take the prochlorperazine for 3 days. Dr. Delton Coombes will see you back in 1 week for labs and follow up.   Thank you for choosing [place] at [facility] to provide your oncology and hematology care.  To afford each patient quality time with our provider, please arrive at least 15 minutes before your scheduled appointment time.   If you have a lab appointment with the [facility] please come in thru the Main Entrance and check in at the main information desk  You need to re-schedule your appointment should you arrive 10 or more minutes late.  We strive to give you quality time with our providers, and arriving late affects you and other patients whose appointments are after yours.  Also, if you no show three or more times for appointments you may be dismissed from the clinic at the providers discretion.     Again, thank you for choosing [facility].  Our hope is that these requests will decrease the amount of time that you wait before being seen by our physicians.       _____________________________________________________________  Should you have questions after your visit to [facility], please contact our office at [phone] between the hours of 8:00 a.m. and 4:30 p.m.  Voicemails left after 4:00 p.m. will not be returned until the following business day.  For prescription refill requests, have your pharmacy contact our office and allow 72 hours.    Cancer Center Support Programs:   > Cancer Support Group  2nd Tuesday of the month 1pm-2pm, Journey Room

## 2019-07-28 NOTE — Progress Notes (Signed)
Ok to start premeds verbal order Dr. Delton Coombes.    Patient tolerated chemotherapy with no complaints voiced.  Side effects with management reviewed with understanding verbalized.  Port site clean and dry with no bruising or swelling noted at site.  Good blood return noted before and after administration of chemotherapy.  Band aid applied. Neulasta onpro applied with green indicator light flashing.   Patient left in satisfactory condition with VSS and no s/s of distress noted.

## 2019-07-28 NOTE — Progress Notes (Signed)
.   Pharmacist Chemotherapy Monitoring - Initial Assessment    Anticipated start date: 07/28/19   Regimen:  . Are orders appropriate based on the patient's diagnosis, regimen, and cycle? Yes . Does the plan date match the patient's scheduled date? Yes . Is the sequencing of drugs appropriate? Yes . Are the premedications appropriate for the patient's regimen? Yes . Prior Authorization for treatment is: Approved o If applicable, is the correct biosimilar selected based on the patient's insurance? not applicable  Organ Function and Labs: Marland Kitchen Are dose adjustments needed based on the patient's renal function, hepatic function, or hematologic function? No . Are appropriate labs ordered prior to the start of patient's treatment? Yes . Other organ system assessment, if indicated: N/A . The following baseline labs, if indicated, have been ordered: N/A  Dose Assessment: . Are the drug doses appropriate? Yes . Are the following correct: o Drug concentrations Yes o IV fluid compatible with drug Yes o Administration routes Yes o Timing of therapy Yes . If applicable, does the patient have documented access for treatment and/or plans for port-a-cath placement? yes . If applicable, have lifetime cumulative doses been properly documented and assessed? yes Lifetime Dose Tracking  No doses have been documented on this patient for the following tracked chemicals: Doxorubicin, Epirubicin, Idarubicin, Daunorubicin, Mitoxantrone, Bleomycin, Oxaliplatin, Carboplatin, Liposomal Doxorubicin  o   Toxicity Monitoring/Prevention: . The patient has the following take home antiemetics prescribed: Prochlorperazine . The patient has the following take home medications prescribed: N/A . Medication allergies and previous infusion related reactions, if applicable, have been reviewed and addressed. Yes . The patient's current medication list has been assessed for drug-drug interactions with their chemotherapy regimen.  no significant drug-drug interactions were identified on review.  Order Review: . Are the treatment plan orders signed? No . Is the patient scheduled to see a provider prior to their treatment? Yes  I verify that I have reviewed each item in the above checklist and answered each question accordingly.  Wynona Neat 07/28/2019 9:27 AM

## 2019-07-28 NOTE — Progress Notes (Signed)
[address] [place], [place] [address]   CLINIC:  Medical Oncology/Hematology  PCP:  Biagio Borg, MD [place] / [place] [address] [phone]   REASON FOR VISIT:  Follow-up for ovarian cancer  PRIOR THERAPY: None  NGS Results: Foundation 1 MS--stable  CURRENT THERAPY: Carboplatin and paclitaxel  BRIEF ONCOLOGIC HISTORY:  Oncology History  Primary ovarian adenocarcinoma, unspecified laterality ([place])  06/20/2019 Initial Diagnosis   Primary ovarian adenocarcinoma, unspecified laterality ([place])   07/01/2019 Genetic Testing   Foundation One     07/11/2019 Genetic Testing   Negative genetic testing:  No pathogenic variants detected on the [facility] Multi-Cancer Panel. The report date is 07/11/2019.  The Multi-Cancer Panel offered by [facility] includes sequencing and/or deletion duplication testing of the following 85 genes: AIP, ALK, APC, ATM, AXIN2,BAP1,  BARD1, BLM, BMPR1A, BRCA1, BRCA2, BRIP1, CASR, CDC73, CDH1, CDK4, CDKN1B, CDKN1C, CDKN2A (p14ARF), CDKN2A (p16INK4a), CEBPA, CHEK2, CTNNA1, DICER1, DIS3L2, EGFR (c.2369C>T, p.Thr790Met variant only), EPCAM (Deletion/duplication testing only), FH, FLCN, GATA2, GPC3, GREM1 (Promoter region deletion/duplication testing only), HOXB13 (c.251G>A, p.Gly84Glu), HRAS, KIT, MAX, MEN1, MET, MITF (c.952G>A, p.Glu318Lys variant only), MLH1, MSH2, MSH3, MSH6, MUTYH, NBN, NF1, NF2, NTHL1, PALB2, PDGFRA, PHOX2B, PMS2, POLD1, POLE, POT1, PRKAR1A, PTCH1, PTEN, RAD50, RAD51C, RAD51D, RB1, RECQL4, RET, RNF43, RUNX1, SDHAF2, SDHA (sequence changes only), SDHB, SDHC, SDHD, SMAD4, SMARCA4, SMARCB1, SMARCE1, STK11, SUFU, TERC, TERT, TMEM127, TP53, TSC1, TSC2, VHL, WRN and WT1.   07/28/2019 -  Chemotherapy   The patient had palonosetron (ALOXI) injection 0.25 mg, 0.25 mg, Intravenous,  Once, 1 of 6 cycles pegfilgrastim (NEULASTA ONPRO KIT) injection 6 mg, 6 mg, Subcutaneous, Once, 1 of 6 cycles CARBOplatin (PARAPLATIN) in  sodium chloride 0.9 % 100 mL chemo infusion, , Intravenous,  Once, 1 of 6 cycles fosaprepitant (EMEND) 150 mg in sodium chloride 0.9 % 145 mL IVPB, 150 mg, Intravenous,  Once, 1 of 6 cycles PACLitaxel (TAXOL) 354 mg in sodium chloride 0.9 % 500 mL chemo infusion (> 82m/m2), 175 mg/m2, Intravenous,  Once, 1 of 6 cycles  for chemotherapy treatment.      CANCER STAGING: Cancer Staging No matching staging information was found for the patient.  INTERVAL HISTORY:  Laura [name] a 74y.o. female, returns for routine follow-up and consideration for next cycle of chemotherapy. Laura [name] last seen on 07/19/2019.  Due for initiating cycle #1 of carboplatin and paclitaxel today.   Overall, she tells me she has been feeling pretty well. Her back pain has improved and she does not have to take her morphine every day, and if she does, she only takes 1 tablet at night. She denies any numbness or tingling. She has been taking amlodipine.  Overall, she feels ready for next cycle of chemo today.    REVIEW OF SYSTEMS:  Review of Systems  Constitutional: Positive for appetite change (moderately decreased) and fatigue (moderate).  Cardiovascular: Positive for leg swelling (legs & feet).  Gastrointestinal: Positive for constipation (mild).  Musculoskeletal: Positive for back pain (improved).  Neurological: Negative for numbness.  All other systems reviewed and are negative.   PAST MEDICAL/SURGICAL HISTORY:  Past Medical History:  Diagnosis Date  . Anxiety   . Arthritis    back of neck, bones spurs on neck  . Cataract   . Cervical disc disease   . Diabetes mellitus   . Family history of thyroid cancer   . Hyperlipidemia   . Hypertension   . Mucoid cyst of joint  right thumb  . Neuropathy   . Port-A-Cath in place 07/21/2019  . Reflux   . Sleep apnea    wears CPAP nightly  . Vertigo    Past Surgical History:  Procedure Laterality Date  . BLADDER SURGERY    . BREAST EXCISIONAL  BIOPSY Bilateral   . BREAST SURGERY    . CHOLECYSTECTOMY    . fibroids removed     breast (both breasts)  . IR IMAGING GUIDED PORT INSERTION  07/26/2019  . MASS EXCISION Right 06/26/2016   Procedure: EXCISION MUCOID TUMOR RIGHT THUMB, IP RIGHT THUMB;  Surgeon: Daryll Brod, MD;  Location: [facility];  Service: Orthopedics;  Laterality: Right;  . PARTIAL HYSTERECTOMY      SOCIAL HISTORY:  Social History   Socioeconomic History  . Marital status: Divorced    Spouse name: Not on file  . Number of children: 4  . Years of education: 67  . Highest education level: Not on file  Occupational History  . Occupation: retired Geographical information systems officer  Tobacco Use  . Smoking status: Never Smoker  . Smokeless tobacco: Never Used  Vaping Use  . Vaping Use: Never used  Substance and Sexual Activity  . Alcohol use: No    Alcohol/week: 0.0 standard drinks  . Drug use: No  . Sexual activity: Not Currently  Other Topics Concern  . Not on file  Social History Narrative  . Not on file   Social Determinants of Health   Financial Resource Strain: Low Risk   . Difficulty of Paying Living Expenses: Not hard at all  Food Insecurity: No Food Insecurity  . Worried About Charity fundraiser in the Last Year: Never true  . Ran Out of Food in the Last Year: Never true  Transportation Needs: No Transportation Needs  . Lack of Transportation (Medical): No  . Lack of Transportation (Non-Medical): No  Physical Activity: Inactive  . Days of Exercise per Week: 0 days  . Minutes of Exercise per Session: 0 min  Stress: No Stress Concern Present  . Feeling of Stress : Not at all  Social Connections: Moderately Integrated  . Frequency of Communication with Friends and Family: More than three times a week  . Frequency of Social Gatherings with Friends and Family: More than three times a week  . Attends Religious Services: More than 4 times per year  . Active Member of Clubs or Organizations:  Yes  . Attends Archivist Meetings: Never  . Marital Status: Divorced  Human resources officer Violence: Not At Risk  . Fear of Current or Ex-Partner: No  . Emotionally Abused: No  . Physically Abused: No  . Sexually Abused: No    FAMILY HISTORY:  Family History  Problem Relation Age of Onset  . Hypertension Other   . Diabetes Father   . Hypertension Father   . Diabetes Sister   . Hypertension Mother   . Hypertension Sister   . Stroke Sister   . Diabetes Maternal Aunt   . Thyroid cancer Daughter 34  . Colon cancer Neg Hx   . Esophageal cancer Neg Hx   . Stomach cancer Neg Hx   . Rectal cancer Neg Hx   . Breast cancer Neg Hx   . Endometrial cancer Neg Hx   . Ovarian cancer Neg Hx     CURRENT MEDICATIONS:  Current Outpatient Medications  Medication Sig Dispense Refill  . amLODipine (NORVASC) 5 MG tablet TAKE ONE (1) TABLET BY MOUTH  EVERY DAY (Patient taking differently: Take 5 mg by mouth daily. ) 90 tablet 1  . Blood Glucose Monitoring Suppl (ONE TOUCH ULTRA 2) w/Device KIT Use as directed 1 each 0  . CARBOPLATIN IV Inject into the vein every 21 ( twenty-one) days.    . cetirizine (ZYRTEC) 10 MG tablet Take 10 mg by mouth daily.    . cholecalciferol (VITAMIN D3) 25 MCG (1000 UNIT) tablet Take 2,000 Units by mouth daily.    Marland Kitchen glimepiride (AMARYL) 2 MG tablet TAKE ONETABLET BY MOUTH DAILY (Patient taking differently: Take 2 mg by mouth daily. ) 90 tablet 3  . Lancets (ONETOUCH ULTRASOFT) lancets USE TO CHECK BLOOD SUGAR TWICE DAILY 100 each 3  . losartan-hydrochlorothiazide (HYZAAR) 100-12.5 MG tablet Take 1 tablet by mouth daily. 90 tablet 3  . ONETOUCH ULTRA test strip USE TO CHECK BLOOD SUGARS TWO TIMES DAILY 100 strip 3  . PACLITAXEL IV Inject 175 mg/m2 into the vein every 21 ( twenty-one) days.    . pantoprazole (PROTONIX) 40 MG tablet TAKE 1 TABLET (40 MG TOTAL) BY MOUTH DAILY. (Patient taking differently: Take 40 mg by mouth daily as needed (acid reflux). ) 90  tablet 1  . Pegfilgrastim (NEULASTA ONPRO [place]) Inject into the skin every 21 ( twenty-one) days.    . sennosides-docusate sodium (SENOKOT-S) 8.6-50 MG tablet Take 2 tablets by mouth daily.    . vitamin B-12 (CYANOCOBALAMIN) 1000 MCG tablet Take 2,000 mcg by mouth daily.    Marland Kitchen acetaminophen (TYLENOL) 500 MG tablet Take 500 mg by mouth 2 (two) times daily as needed for moderate pain or headache. (Patient not taking: Reported on 07/28/2019)    . Lactulose 20 GM/30ML SOLN Take 30 mLs (20 g total) by mouth at bedtime. (Patient not taking: Reported on 07/20/2019) 480 mL 3  . lidocaine-prilocaine (EMLA) cream Apply a small amount to port a cath site and cover with plastic wrap 1 hour prior to chemotherapy appointments (Patient not taking: Reported on 07/28/2019) 30 g 3  . loperamide (IMODIUM) 2 MG capsule Take by mouth as needed for diarrhea or loose stools. (Patient not taking: Reported on 07/28/2019)    . morphine (MSIR) 15 MG tablet Take 1 tablet (15 mg total) by mouth 2 (two) times daily as needed for severe pain. (Patient not taking: Reported on 07/28/2019) 30 tablet 0  . OVER THE COUNTER MEDICATION Apply 1 application topically daily as needed (pain). Triderma otc pain cream (Patient not taking: Reported on 07/28/2019)    . prochlorperazine (COMPAZINE) 10 MG tablet Take 1 tablet (10 mg total) by mouth every 6 (six) hours as needed (Nausea or vomiting). (Patient not taking: Reported on 07/28/2019) 30 tablet 1   No current facility-administered medications for this visit.   Facility-Administered Medications Ordered in Other Visits  Medication Dose Route Frequency Provider Last Rate Last Admin  . dexamethasone (DECADRON) 10 mg in sodium chloride 0.9 % 50 mL IVPB  10 mg Intravenous Once Derek Jack, MD      . diphenhydrAMINE (BENADRYL) injection 50 mg  50 mg Intravenous Once Derek Jack, MD      . famotidine (PEPCID) IVPB 20 mg premix  20 mg Intravenous Once Derek Jack, MD      .  fosaprepitant (EMEND) 150 mg in sodium chloride 0.9 % 145 mL IVPB  150 mg Intravenous Once Derek Jack, MD      . heparin lock flush 100 unit/mL  500 Units Intracatheter Once PRN Derek Jack, MD      .  palonosetron (ALOXI) injection 0.25 mg  0.25 mg Intravenous Once Derek Jack, MD      . sodium chloride flush (NS) 0.9 % injection 10 mL  10 mL Intracatheter PRN Derek Jack, MD   10 mL at 07/28/19 0347    ALLERGIES:  Allergies  Allergen Reactions  . Hydrocodone-Homatropine Other (See Comments)    Vertigo *pt strongly prefers to never take*  . Sulfa Antibiotics Hives, Itching and Swelling    Tongue swells  . Augmentin [Amoxicillin-Pot Clavulanate]     Diarrhea; can take PCN/ Amoxicillin  . Codeine Itching  . Crestor [Rosuvastatin Calcium] Other (See Comments)    Did something to memory    . Doxycycline Other (See Comments)    Severe rectal Gas.  . Keflex [Cephalexin] Diarrhea and Nausea And Vomiting  . Naproxen Other (See Comments)    Stomach cramps  . Statins     Muscle weakness  . Prednisone Anxiety    *pt strongly prefers to never be given prednisone*     PHYSICAL EXAM:  Performance status (ECOG): 1 - Symptomatic but completely ambulatory  Vitals:   07/28/19 0808  BP: 132/61  Pulse: 80  Resp: 20  Temp: (!) 97 F (36.1 C)  SpO2: 100%   Wt Readings from Last 3 Encounters:  07/28/19 199 lb 9.6 oz (90.5 kg)  07/26/19 193 lb (87.5 kg)  07/19/19 197 lb 6.4 oz (89.5 kg)   Physical Exam Vitals reviewed.  Constitutional:      Appearance: Normal appearance. She is obese.  Cardiovascular:     Rate and Rhythm: Normal rate and regular rhythm.     Pulses: Normal pulses.     Heart sounds: Normal heart sounds.  Pulmonary:     Effort: Pulmonary effort is normal.     Breath sounds: Normal breath sounds.  Chest:     Comments: Port on R chest Abdominal:     Palpations: Abdomen is soft.     Tenderness: There is no abdominal tenderness.    Musculoskeletal:     Right lower leg: Edema (1+) present.     Left lower leg: Edema (1+) present.  Neurological:     General: No focal deficit present.     Mental Status: She is alert and oriented to person, place, and time.  Psychiatric:        Mood and Affect: Mood normal.        Behavior: Behavior normal.     LABORATORY DATA:  I have reviewed the labs as listed.  CBC Latest Ref Rng & Units 07/28/2019 06/06/2019 04/05/2019  WBC 4.0 - 10.5 K/uL 7.3 8.2 8.8  Hemoglobin 12.0 - 15.0 g/dL 11.5(L) 12.5 12.0  Hematocrit 36 - 46 % 35.0(L) 38.0 37.2  Platelets 150 - 400 K/uL 226 256 248   CMP Latest Ref Rng & Units 07/28/2019 06/06/2019 04/03/2019  Glucose 70 - 99 mg/dL 114(H) 166(H) 180(H)  BUN 8 - 23 mg/dL 17 13 14   Creatinine 0.44 - 1.00 mg/dL 1.01(H) 0.79 0.82  Sodium 135 - 145 mmol/L 133(L) 141 136  Potassium 3.5 - 5.1 mmol/L 3.8 4.2 3.6  Chloride 98 - 111 mmol/L 100 108 102  CO2 22 - 32 mmol/L 26 22 24   Calcium 8.9 - 10.3 mg/dL 9.8 9.5 9.7  Total Protein 6.5 - 8.1 g/dL 7.6 - 7.8  Total Bilirubin 0.3 - 1.2 mg/dL 0.4 - 0.5  Alkaline Phos 38 - 126 U/L 61 - 75  AST 15 - 41 U/L 18 - 18  ALT 0 - 44 U/L 13 - 14    DIAGNOSTIC IMAGING:  I have independently reviewed the scans and discussed with the patient. IR IMAGING GUIDED PORT INSERTION  Result Date: 07/26/2019 INDICATION: 74 year old female with a history of ovarian carcinoma referred for port catheter EXAM: IMAGE GUIDED PORT CATHETER MEDICATIONS: 1 g vancomycin; The antibiotic was administered within an appropriate time interval prior to skin puncture. ANESTHESIA/SEDATION: Moderate (conscious) sedation was employed during this procedure. A total of Versed 1.0 mg and Fentanyl 50 mcg was administered intravenously. Moderate Sedation Time: 18 minutes. The patient's level of consciousness and vital signs were monitored continuously by radiology nursing throughout the procedure under my direct supervision. FLUOROSCOPY TIME:  Fluoroscopy  Time: 0 minutes 12 seconds (1 mGy). COMPLICATIONS: None PROCEDURE: The procedure, risks, benefits, and alternatives were explained to the patient. Questions regarding the procedure were encouraged and answered. The patient understands and consents to the procedure. Ultrasound survey was performed with images stored and sent to PACs. The right neck and chest was prepped with chlorhexidine, and draped in the usual sterile fashion using maximum barrier technique (cap and mask, sterile gown, sterile gloves, large sterile sheet, hand hygiene and cutaneous antiseptic). Antibiotic prophylaxis was provided with 2.0g Ancef administered IV one hour prior to skin incision. Local anesthesia was attained by infiltration with 1% lidocaine without epinephrine. Ultrasound demonstrated patency of the right internal jugular vein, and this was documented with an image. Under real-time ultrasound guidance, this vein was accessed with a 21 gauge micropuncture needle and image documentation was performed. A small dermatotomy was made at the access site with an 11 scalpel. A 0.018" wire was advanced into the SVC and used to estimate the length of the internal catheter. The access needle exchanged for a 35F micropuncture vascular sheath. The 0.018" wire was then removed and a 0.035" wire advanced into the IVC. An appropriate location for the subcutaneous reservoir was selected below the clavicle and an incision was made through the skin and underlying soft tissues. The subcutaneous tissues were then dissected using a combination of blunt and sharp surgical technique and a pocket was formed. A single lumen power injectable portacatheter was then tunneled through the subcutaneous tissues from the pocket to the dermatotomy and the port reservoir placed within the subcutaneous pocket. The venous access site was then serially dilated and a peel away vascular sheath placed over the wire. The wire was removed and the port catheter advanced into  position under fluoroscopic guidance. The catheter tip is positioned in the cavoatrial junction. This was documented with a spot image. The portacatheter was then tested and found to flush and aspirate well. The port was flushed with saline followed by 100 units/mL heparinized saline. The pocket was then closed in two layers using first subdermal inverted interrupted absorbable sutures followed by a running subcuticular suture. The epidermis was then sealed with Dermabond. The dermatotomy at the venous access site was also seal with Dermabond. Patient tolerated the procedure well and remained hemodynamically stable throughout. No complications encountered and no significant blood loss encountered IMPRESSION: Status post right IJ port catheter placement. Signed, Dulcy Fanny. Dellia Nims, RPVI [facility] Radiology Electronically Signed   By: Corrie Mckusick D.O.   On: 07/26/2019 14:32     ASSESSMENT:  1.   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 Mass measures 6 x 5.8 cm and is  partially calcified. Separate solid component superior to this in the left periaortic region measures 3.5 cm, SUV 14.3. Cystic component medial to the lower pole of the left kidney is without hypermetabolic activity, possibly a lymphocele. -CT-guided biopsy of the right retroperitoneal lymph node consistent with adenocarcinoma with psammoma bodies. Morphology and immunotherapy consistent with ovarian serous carcinoma/primary peritoneal carcinoma. -Germline mutation testing was negative. -Foundation 1 testing shows MS-stable.  Loss of heterozygosity was less than 16%. -She met with Dr. Denman George who recommended neoadjuvant chemotherapy with 3-6 cycles of carboplatin and paclitaxel with interim staging after 3 cycles.  If there is substantial response unless  encasement of the aorta and vena cava, consideration for debulking procedure can be possible at that time. -CA-125 346 on 06/23/2019.   PLAN:  1. Ovarian serous carcinoma: -She has port placed by IR.  She is ready to get started on her first cycle. -Have reviewed her labs.  We will proceed with her first cycle today. -I plan to cut back on docetaxel dose to 140 mg per metered square and see how she tolerates it.  If she does well we will increase it at next cycle.  We went over the side effects in detail again. -I plan to see her back in 1 week for follow-up to see how she is tolerating treatment with repeat labs.  2. Low back pain: -Continue MSIR 15 mg as needed.  She is not requiring on a daily basis.  3. Constipation: -Lactulose did not help.  Continue Senokot 1 tablet daily.  4. Hypertension: -Continue Norvasc and losartan/HCTZ.  5.  Leg swelling: -This is likely from compression of IVC.  Will improve with therapy.   Orders placed this encounter:  No orders of the defined types were placed in this encounter.    Derek Jack, MD [place] [phone]   I, Milinda Antis, am acting as a scribe for Dr. Sanda Linger.  I, Derek Jack MD, have reviewed the above documentation for accuracy and completeness, and I agree with the above.    ch

## 2019-07-29 ENCOUNTER — Telehealth (HOSPITAL_COMMUNITY): Payer: Self-pay

## 2019-07-29 NOTE — Telephone Encounter (Signed)
Chemotherapy 24 hour follow up.  Spoke with the patient with no complaints verbalized.  Patient stated she felt great and was cleaning her cabinets.  Reminded the patient to take nausea medications if needed and to call for any issues or questions with understanding verbalized.

## 2019-08-01 ENCOUNTER — Other Ambulatory Visit: Payer: Self-pay

## 2019-08-01 ENCOUNTER — Encounter (HOSPITAL_COMMUNITY): Payer: Self-pay | Admitting: *Deleted

## 2019-08-01 ENCOUNTER — Telehealth (HOSPITAL_COMMUNITY): Payer: Self-pay | Admitting: *Deleted

## 2019-08-01 ENCOUNTER — Emergency Department (HOSPITAL_COMMUNITY)
Admission: EM | Admit: 2019-08-01 | Discharge: 2019-08-01 | Disposition: A | Discharge status: Home / Self Care | Payer: Medicare Other | Attending: Emergency Medicine | Admitting: Emergency Medicine

## 2019-08-01 DIAGNOSIS — Z5321 Procedure and treatment not carried out due to patient leaving prior to being seen by health care provider: Secondary | ICD-10-CM | POA: Insufficient documentation

## 2019-08-01 DIAGNOSIS — R52 Pain, unspecified: Secondary | ICD-10-CM | POA: Diagnosis not present

## 2019-08-01 NOTE — ED Triage Notes (Signed)
States she had her first chemo therapy last week. States he pain medication is no longer working for her, states she is hurting all over. States she usually takes Morphine for pain.

## 2019-08-01 NOTE — ED Notes (Signed)
States she is feeling better and tired of waiting

## 2019-08-01 NOTE — Telephone Encounter (Signed)
Pt left message for return call. Called pt, stated that she is just not feeling well. She states that she is very restless. She denies any nausea, vomiting or diarrhea. She states that she takes the pain medication but it doesn't seem to help and she is just restless. She states that she has noticed some redness in her port area. The pt stated that she was in the ER and waiting to be seen. Pt will call us back with an update.

## 2019-08-01 NOTE — Telephone Encounter (Signed)
Pt called the clinic back and stated that "I have been in the ER since 2:00 and they have only spent 2 minutes with me, they have all these people running around and no one has come to see about me, I fell asleep and feel better now so I left, I want my care transferred to [facility], I know I will get better care from there, I am very disappointed in the care I just had, very disappointed." I advised the pt that I was sorry for her disappointment and that I would have my director call her about this tomorrow, the pt verbalized understanding.

## 2019-08-02 ENCOUNTER — Telehealth (HOSPITAL_COMMUNITY): Payer: Self-pay | Admitting: *Deleted

## 2019-08-02 NOTE — Telephone Encounter (Signed)
Phoned pt to follow up with her after her call to the clinic yesterday and subsequent ER visit.  She reports she was unhappy with her ER care - she reports she waited 1.5 hours for triage, and then was placed in a chair in the hallway.  She reports that she was having aching pains all over her body, which was the reason for her visit.  She states she managed to fall asleep and when she woke up she waited 30 minutes and decided to leave the ED as she was tired of waiting and feeling somewhat better after having slept.  She reports n/v x 1 episode this morning.  She has taken her nausea medication.  I offered to have the patient come in for IV hydration and to received IV nausea meds.  She reports she feels "pretty stable" at the moment.  Advised her to call me back should this change and we would see her in the clinic and give her fluids.  She is agreeable to this.  I advised the patient going forward to start taking her nausea medication every 6 hours as prescribed, whether or not she is feeling sick, on the evenings of chemotherapy treatments, and for 3-4 days after to help prevent n/v going forward.  I also advised pt to take OTC Claritin 10mg  daily starting the day before treatments and for several days after treatment, as pt receives Neulasta OnPro injection, and this could be the cause of her body/bone aches.  She verbalizes understanding of the above and knows to call me back if she has any other questions/concerns.

## 2019-08-04 ENCOUNTER — Inpatient Hospital Stay (HOSPITAL_COMMUNITY): Payer: Medicare Other

## 2019-08-04 ENCOUNTER — Other Ambulatory Visit: Payer: Self-pay

## 2019-08-04 ENCOUNTER — Inpatient Hospital Stay (HOSPITAL_BASED_OUTPATIENT_CLINIC_OR_DEPARTMENT_OTHER): Payer: Medicare Other | Admitting: Hematology

## 2019-08-04 ENCOUNTER — Encounter (HOSPITAL_COMMUNITY): Payer: Self-pay

## 2019-08-04 VITALS — BP 139/67 | HR 83 | Temp 97.1°F | Resp 18 | Wt 192.0 lb

## 2019-08-04 DIAGNOSIS — C569 Malignant neoplasm of unspecified ovary: Secondary | ICD-10-CM | POA: Diagnosis not present

## 2019-08-04 DIAGNOSIS — K5900 Constipation, unspecified: Secondary | ICD-10-CM | POA: Diagnosis not present

## 2019-08-04 DIAGNOSIS — M545 Low back pain: Secondary | ICD-10-CM | POA: Diagnosis not present

## 2019-08-04 DIAGNOSIS — G4730 Sleep apnea, unspecified: Secondary | ICD-10-CM | POA: Diagnosis not present

## 2019-08-04 DIAGNOSIS — Z9989 Dependence on other enabling machines and devices: Secondary | ICD-10-CM | POA: Diagnosis not present

## 2019-08-04 DIAGNOSIS — E785 Hyperlipidemia, unspecified: Secondary | ICD-10-CM | POA: Diagnosis not present

## 2019-08-04 DIAGNOSIS — R05 Cough: Secondary | ICD-10-CM | POA: Diagnosis not present

## 2019-08-04 DIAGNOSIS — Z5111 Encounter for antineoplastic chemotherapy: Secondary | ICD-10-CM | POA: Diagnosis not present

## 2019-08-04 DIAGNOSIS — Z7984 Long term (current) use of oral hypoglycemic drugs: Secondary | ICD-10-CM | POA: Diagnosis not present

## 2019-08-04 DIAGNOSIS — I10 Essential (primary) hypertension: Secondary | ICD-10-CM | POA: Diagnosis not present

## 2019-08-04 DIAGNOSIS — Z95828 Presence of other vascular implants and grafts: Secondary | ICD-10-CM

## 2019-08-04 DIAGNOSIS — R5383 Other fatigue: Secondary | ICD-10-CM | POA: Diagnosis not present

## 2019-08-04 DIAGNOSIS — K219 Gastro-esophageal reflux disease without esophagitis: Secondary | ICD-10-CM | POA: Diagnosis not present

## 2019-08-04 DIAGNOSIS — Z5189 Encounter for other specified aftercare: Secondary | ICD-10-CM | POA: Diagnosis not present

## 2019-08-04 DIAGNOSIS — E1136 Type 2 diabetes mellitus with diabetic cataract: Secondary | ICD-10-CM | POA: Diagnosis not present

## 2019-08-04 DIAGNOSIS — Z79899 Other long term (current) drug therapy: Secondary | ICD-10-CM | POA: Diagnosis not present

## 2019-08-04 DIAGNOSIS — R42 Dizziness and giddiness: Secondary | ICD-10-CM | POA: Diagnosis not present

## 2019-08-04 DIAGNOSIS — R600 Localized edema: Secondary | ICD-10-CM | POA: Diagnosis not present

## 2019-08-04 LAB — COMPREHENSIVE METABOLIC PANEL
ALT: 29 U/L (ref 0–44)
AST: 24 U/L (ref 15–41)
Albumin: 4.5 g/dL (ref 3.5–5.0)
Alkaline Phosphatase: 96 U/L (ref 38–126)
Anion gap: 12 (ref 5–15)
BUN: 21 mg/dL (ref 8–23)
CO2: 24 mmol/L (ref 22–32)
Calcium: 9.6 mg/dL (ref 8.9–10.3)
Chloride: 97 mmol/L — ABNORMAL LOW (ref 98–111)
Creatinine, Ser: 1.13 mg/dL — ABNORMAL HIGH (ref 0.44–1.00)
GFR calc Af Amer: 55 mL/min — ABNORMAL LOW (ref >60)
GFR calc non Af Amer: 48 mL/min — ABNORMAL LOW (ref >60)
Glucose, Bld: 132 mg/dL — ABNORMAL HIGH (ref 70–99)
Potassium: 3.6 mmol/L (ref 3.5–5.1)
Sodium: 133 mmol/L — ABNORMAL LOW (ref 135–145)
Total Bilirubin: 0.4 mg/dL (ref 0.3–1.2)
Total Protein: 7.2 g/dL (ref 6.5–8.1)

## 2019-08-04 LAB — CBC WITH DIFFERENTIAL/PLATELET
Abs Immature Granulocytes: 2.07 10*3/uL — ABNORMAL HIGH (ref 0.00–0.07)
Basophils Absolute: 0.1 10*3/uL (ref 0.0–0.1)
Basophils Relative: 0 %
Eosinophils Absolute: 0.4 10*3/uL (ref 0.0–0.5)
Eosinophils Relative: 2 %
HCT: 34.8 % — ABNORMAL LOW (ref 36.0–46.0)
Hemoglobin: 11.4 g/dL — ABNORMAL LOW (ref 12.0–15.0)
Immature Granulocytes: 9 %
Lymphocytes Relative: 14 %
Lymphs Abs: 3.1 10*3/uL (ref 0.7–4.0)
MCH: 28.9 pg (ref 26.0–34.0)
MCHC: 32.8 g/dL (ref 30.0–36.0)
MCV: 88.1 fL (ref 80.0–100.0)
Monocytes Absolute: 2.3 10*3/uL — ABNORMAL HIGH (ref 0.1–1.0)
Monocytes Relative: 10 %
Neutro Abs: 14.5 10*3/uL — ABNORMAL HIGH (ref 1.7–7.7)
Neutrophils Relative %: 65 %
Platelets: 208 10*3/uL (ref 150–400)
RBC: 3.95 MIL/uL (ref 3.87–5.11)
RDW: 13.8 % (ref 11.5–15.5)
WBC Morphology: INCREASED
WBC: 22.3 10*3/uL — ABNORMAL HIGH (ref 4.0–10.5)
nRBC: 0 % (ref 0.0–0.2)

## 2019-08-04 MED ORDER — HEPARIN SOD (PORK) LOCK FLUSH 100 UNIT/ML IV SOLN
500.0000 [IU] | Freq: Once | INTRAVENOUS | Status: AC
  Administered 2019-08-04: 500 [IU] via INTRAVENOUS

## 2019-08-04 MED ORDER — SODIUM CHLORIDE 0.9% FLUSH
10.0000 mL | INTRAVENOUS | Status: DC | PRN
  Administered 2019-08-04: 10 mL via INTRAVENOUS

## 2019-08-04 NOTE — Progress Notes (Signed)
[name] presented for Portacath access and flush.  Portacath located right chest wall accessed with  H 20 needle.  Good blood return present. Portacath flushed with 56ml NS and 500U/61ml Heparin and needle removed intact.  Procedure tolerated well and without incident.

## 2019-08-04 NOTE — Progress Notes (Signed)
Laura [name], [place] [address]   CLINIC:  Medical Oncology/Hematology  PCP:  Biagio Borg, MD [place] / [place] [address] [phone]   REASON FOR VISIT:  Follow-up for ovarian cancer  PRIOR THERAPY: None  NGS Results: Foundation 1 MS--stable  CURRENT THERAPY: Carboplatin and paclitaxel  BRIEF ONCOLOGIC HISTORY:  Oncology History  Primary ovarian adenocarcinoma, unspecified laterality ([place])  06/20/2019 Initial Diagnosis   Primary ovarian adenocarcinoma, unspecified laterality ([place])   07/01/2019 Genetic Testing   Foundation One     07/11/2019 Genetic Testing   Negative genetic testing:  No pathogenic variants detected on the [facility] Multi-Cancer Panel. The report date is 07/11/2019.  The Multi-Cancer Panel offered by [facility] includes sequencing and/or deletion duplication testing of the following 85 genes: AIP, ALK, APC, ATM, AXIN2,BAP1,  BARD1, BLM, BMPR1A, BRCA1, BRCA2, BRIP1, CASR, CDC73, CDH1, CDK4, CDKN1B, CDKN1C, CDKN2A (p14ARF), CDKN2A (p16INK4a), CEBPA, CHEK2, CTNNA1, DICER1, DIS3L2, EGFR (c.2369C>T, p.Thr790Met variant only), EPCAM (Deletion/duplication testing only), FH, FLCN, GATA2, GPC3, GREM1 (Promoter region deletion/duplication testing only), HOXB13 (c.251G>A, p.Gly84Glu), HRAS, KIT, MAX, MEN1, MET, MITF (c.952G>A, p.Glu318Lys variant only), MLH1, MSH2, MSH3, MSH6, MUTYH, NBN, NF1, NF2, NTHL1, PALB2, PDGFRA, PHOX2B, PMS2, POLD1, POLE, POT1, PRKAR1A, PTCH1, PTEN, RAD50, RAD51C, RAD51D, RB1, RECQL4, RET, RNF43, RUNX1, SDHAF2, SDHA (sequence changes only), SDHB, SDHC, SDHD, SMAD4, SMARCA4, SMARCB1, SMARCE1, STK11, SUFU, TERC, TERT, TMEM127, TP53, TSC1, TSC2, VHL, WRN and WT1.   07/28/2019 -  Chemotherapy   The patient had palonosetron (ALOXI) injection 0.25 mg, 0.25 mg, Intravenous,  Once, 1 of 6 cycles Administration: 0.25 mg (07/28/2019) pegfilgrastim (NEULASTA ONPRO KIT) injection 6 mg, 6 mg, Subcutaneous, Once, 1 of  6 cycles Administration: 6 mg (07/28/2019) CARBOplatin (PARAPLATIN) 470 mg in sodium chloride 0.9 % 250 mL chemo infusion, 470 mg (100 % of original dose 470 mg), Intravenous,  Once, 1 of 6 cycles Dose modification:   (original dose 470 mg, Cycle 1, Reason: Provider Judgment) Administration: 470 mg (07/28/2019) fosaprepitant (EMEND) 150 mg in sodium chloride 0.9 % 145 mL IVPB, 150 mg, Intravenous,  Once, 1 of 6 cycles Administration: 150 mg (07/28/2019) PACLitaxel (TAXOL) 282 mg in sodium chloride 0.9 % 250 mL chemo infusion (> 72m/m2), 140 mg/m2 = 282 mg (80 % of original dose 175 mg/m2), Intravenous,  Once, 1 of 6 cycles Dose modification: 140 mg/m2 (80 % of original dose 175 mg/m2, Cycle 1, Reason: Provider Judgment) Administration: 282 mg (07/28/2019)  for chemotherapy treatment.      CANCER STAGING: Cancer Staging No matching staging information was found for the patient.  INTERVAL HISTORY:  Laura [name] a 74y.o. female, returns for routine follow-up of her ovarian cancer. MMatsuewas last seen on 07/28/2019. She started her first cycle of carboplatin and paclitaxel on 07/28/2019.  Today she complains of being very restless and pain in her abdomen and shoulder aches which interferes with her sleep. She reports taking Claritin which helped with the restlessness and the morphine helps with the aches and pains. She reports one episode of vomiting on 08/01/2019. She is taking Compazine every morning for the nausea, which is well controlled. She occasionally feels constipated. The swelling in her legs has come down after taking Lasix. She denies having any burning with urination. Her hair has started thinning and she denies having mouth sores.   REVIEW OF SYSTEMS:  Review of Systems  Constitutional: Positive for appetite change (severely decreased) and fatigue (moderate).  HENT:  Negative for mouth sores.   Cardiovascular: Negative for leg swelling.  Gastrointestinal: Positive for  abdominal pain, constipation and vomiting (once).  Genitourinary: Negative for dysuria.   Musculoskeletal: Positive for arthralgias (4/10 shoulder aches occasionally).  Neurological: Positive for dizziness and numbness (hands).  Psychiatric/Behavioral: Positive for sleep disturbance.  All other systems reviewed and are negative.   PAST MEDICAL/SURGICAL HISTORY:  Past Medical History:  Diagnosis Date  . Anxiety   . Arthritis    back of neck, bones spurs on neck  . Cataract   . Cervical disc disease   . Diabetes mellitus   . Family history of thyroid cancer   . Hyperlipidemia   . Hypertension   . Mucoid cyst of joint    right thumb  . Neuropathy   . Port-A-Cath in place 07/21/2019  . Reflux   . Sleep apnea    wears CPAP nightly  . Vertigo    Past Surgical History:  Procedure Laterality Date  . BLADDER SURGERY    . BREAST EXCISIONAL BIOPSY Bilateral   . BREAST SURGERY    . CHOLECYSTECTOMY    . fibroids removed     breast (both breasts)  . IR IMAGING GUIDED PORT INSERTION  07/26/2019  . MASS EXCISION Right 06/26/2016   Procedure: EXCISION MUCOID TUMOR RIGHT THUMB, IP RIGHT THUMB;  Surgeon: Daryll Brod, MD;  Location: [facility];  Service: Orthopedics;  Laterality: Right;  . PARTIAL HYSTERECTOMY      SOCIAL HISTORY:  Social History   Socioeconomic History  . Marital status: Divorced    Spouse name: Not on file  . Number of children: 4  . Years of education: 56  . Highest education level: Not on file  Occupational History  . Occupation: retired Geographical information systems officer  Tobacco Use  . Smoking status: Never Smoker  . Smokeless tobacco: Never Used  Vaping Use  . Vaping Use: Never used  Substance and Sexual Activity  . Alcohol use: No    Alcohol/week: 0.0 standard drinks  . Drug use: No  . Sexual activity: Not Currently  Other Topics Concern  . Not on file  Social History Narrative  . Not on file   Social Determinants of Health   Financial  Resource Strain: Low Risk   . Difficulty of Paying Living Expenses: Not hard at all  Food Insecurity: No Food Insecurity  . Worried About Charity fundraiser in the Last Year: Never true  . Ran Out of Food in the Last Year: Never true  Transportation Needs: No Transportation Needs  . Lack of Transportation (Medical): No  . Lack of Transportation (Non-Medical): No  Physical Activity: Inactive  . Days of Exercise per Week: 0 days  . Minutes of Exercise per Session: 0 min  Stress: No Stress Concern Present  . Feeling of Stress : Not at all  Social Connections: Moderately Integrated  . Frequency of Communication with Friends and Family: More than three times a week  . Frequency of Social Gatherings with Friends and Family: More than three times a week  . Attends Religious Services: More than 4 times per year  . Active Member of Clubs or Organizations: Yes  . Attends Archivist Meetings: Never  . Marital Status: Divorced  Human resources officer Violence: Not At Risk  . Fear of Current or Ex-Partner: No  . Emotionally Abused: No  . Physically Abused: No  . Sexually Abused: No    FAMILY HISTORY:  Family History  Problem  Relation Age of Onset  . Hypertension Other   . Diabetes Father   . Hypertension Father   . Diabetes Sister   . Hypertension Mother   . Hypertension Sister   . Stroke Sister   . Diabetes Maternal Aunt   . Thyroid cancer Daughter 27  . Colon cancer Neg Hx   . Esophageal cancer Neg Hx   . Stomach cancer Neg Hx   . Rectal cancer Neg Hx   . Breast cancer Neg Hx   . Endometrial cancer Neg Hx   . Ovarian cancer Neg Hx     CURRENT MEDICATIONS:  Current Outpatient Medications  Medication Sig Dispense Refill  . amLODipine (NORVASC) 5 MG tablet TAKE ONE (1) TABLET BY MOUTH EVERY DAY (Patient taking differently: Take 5 mg by mouth daily. ) 90 tablet 1  . Blood Glucose Monitoring Suppl (ONE TOUCH ULTRA 2) w/Device KIT Use as directed 1 each 0  . CARBOPLATIN IV  Inject into the vein every 21 ( twenty-one) days.    . cetirizine (ZYRTEC) 10 MG tablet Take 10 mg by mouth daily.    . cholecalciferol (VITAMIN D3) 25 MCG (1000 UNIT) tablet Take 2,000 Units by mouth daily.    . furosemide (LASIX) 20 MG tablet Take 1 tablet (20 mg total) by mouth daily. 30 tablet 2  . glimepiride (AMARYL) 2 MG tablet TAKE ONETABLET BY MOUTH DAILY (Patient taking differently: Take 2 mg by mouth daily. ) 90 tablet 3  . Lancets (ONETOUCH ULTRASOFT) lancets USE TO CHECK BLOOD SUGAR TWICE DAILY 100 each 3  . losartan-hydrochlorothiazide (HYZAAR) 100-12.5 MG tablet Take 1 tablet by mouth daily. 90 tablet 3  . ONETOUCH ULTRA test strip USE TO CHECK BLOOD SUGARS TWO TIMES DAILY 100 strip 3  . PACLITAXEL IV Inject 175 mg/m2 into the vein every 21 ( twenty-one) days.    . Pegfilgrastim (NEULASTA ONPRO [place]) Inject into the skin every 21 ( twenty-one) days.    . sennosides-docusate sodium (SENOKOT-S) 8.6-50 MG tablet Take 2 tablets by mouth daily.    . vitamin B-12 (CYANOCOBALAMIN) 1000 MCG tablet Take 2,000 mcg by mouth daily.    Marland Kitchen acetaminophen (TYLENOL) 500 MG tablet Take 500 mg by mouth 2 (two) times daily as needed for moderate pain or headache. (Patient not taking: Reported on 07/28/2019)    . Lactulose 20 GM/30ML SOLN Take 30 mLs (20 g total) by mouth at bedtime. (Patient not taking: Reported on 07/20/2019) 480 mL 3  . lidocaine-prilocaine (EMLA) cream Apply a small amount to port a cath site and cover with plastic wrap 1 hour prior to chemotherapy appointments (Patient not taking: Reported on 07/28/2019) 30 g 3  . loperamide (IMODIUM) 2 MG capsule Take by mouth as needed for diarrhea or loose stools. (Patient not taking: Reported on 07/28/2019)    . morphine (MSIR) 15 MG tablet Take 1 tablet (15 mg total) by mouth 2 (two) times daily as needed for severe pain. (Patient not taking: Reported on 07/28/2019) 30 tablet 0  . OVER THE COUNTER MEDICATION Apply 1 application topically daily as needed  (pain). Triderma otc pain cream (Patient not taking: Reported on 07/28/2019)    . pantoprazole (PROTONIX) 40 MG tablet TAKE 1 TABLET (40 MG TOTAL) BY MOUTH DAILY. (Patient not taking: Reported on 08/04/2019) 90 tablet 1  . prochlorperazine (COMPAZINE) 10 MG tablet Take 1 tablet (10 mg total) by mouth every 6 (six) hours as needed (Nausea or vomiting). (Patient not taking: Reported on 07/28/2019) 30 tablet  1   No current facility-administered medications for this visit.   Facility-Administered Medications Ordered in Other Visits  Medication Dose Route Frequency Provider Last Rate Last Admin  . sodium chloride flush (NS) 0.9 % injection 10 mL  10 mL Intravenous PRN Derek Jack, MD   10 mL at 08/04/19 0834    ALLERGIES:  Allergies  Allergen Reactions  . Hydrocodone-Homatropine Other (See Comments)    Vertigo *pt strongly prefers to never take*  . Sulfa Antibiotics Hives, Itching and Swelling    Tongue swells  . Augmentin [Amoxicillin-Pot Clavulanate]     Diarrhea; can take PCN/ Amoxicillin  . Codeine Itching  . Crestor [Rosuvastatin Calcium] Other (See Comments)    Did something to memory    . Doxycycline Other (See Comments)    Severe rectal Gas.  . Keflex [Cephalexin] Diarrhea and Nausea And Vomiting  . Naproxen Other (See Comments)    Stomach cramps  . Statins     Muscle weakness  . Prednisone Anxiety    *pt strongly prefers to never be given prednisone*     PHYSICAL EXAM:  Performance status (ECOG): 1 - Symptomatic but completely ambulatory  Vitals:   08/04/19 0824  BP: 139/67  Pulse: 83  Resp: 18  Temp: (!) 97.1 F (36.2 C)  SpO2: 100%   Wt Readings from Last 3 Encounters:  08/04/19 192 lb (87.1 kg)  08/01/19 199 lb (90.3 kg)  07/28/19 199 lb 9.6 oz (90.5 kg)   Physical Exam Vitals reviewed.  Constitutional:      Appearance: Normal appearance. She is obese.  Cardiovascular:     Rate and Rhythm: Normal rate and regular rhythm.     Pulses: Normal  pulses.     Heart sounds: Normal heart sounds.  Pulmonary:     Effort: Pulmonary effort is normal.     Breath sounds: Normal breath sounds.  Chest:     Comments: Port-a-Cath on R chest Abdominal:     Palpations: Abdomen is soft.     Tenderness: There is no abdominal tenderness.  Musculoskeletal:     Right lower leg: No edema.     Left lower leg: No edema.  Neurological:     General: No focal deficit present.     Mental Status: She is alert and oriented to person, place, and time.  Psychiatric:        Mood and Affect: Mood normal.        Behavior: Behavior normal.      LABORATORY DATA:  I have reviewed the labs as listed.  CBC Latest Ref Rng & Units 08/04/2019 07/28/2019 06/06/2019  WBC 4.0 - 10.5 K/uL 22.3(H) 7.3 8.2  Hemoglobin 12.0 - 15.0 g/dL 11.4(L) 11.5(L) 12.5  Hematocrit 36 - 46 % 34.8(L) 35.0(L) 38.0  Platelets 150 - 400 K/uL 208 226 256   CMP Latest Ref Rng & Units 08/04/2019 07/28/2019 06/06/2019  Glucose 70 - 99 mg/dL 132(H) 114(H) 166(H)  BUN 8 - 23 mg/dL 21 17 13   Creatinine 0.44 - 1.00 mg/dL 1.13(H) 1.01(H) 0.79  Sodium 135 - 145 mmol/L 133(L) 133(L) 141  Potassium 3.5 - 5.1 mmol/L 3.6 3.8 4.2  Chloride 98 - 111 mmol/L 97(L) 100 108  CO2 22 - 32 mmol/L 24 26 22   Calcium 8.9 - 10.3 mg/dL 9.6 9.8 9.5  Total Protein 6.5 - 8.1 g/dL 7.2 7.6 -  Total Bilirubin 0.3 - 1.2 mg/dL 0.4 0.4 -  Alkaline Phos 38 - 126 U/L 96 61 -  AST 15 -  41 U/L 24 18 -  ALT 0 - 44 U/L 29 13 -    DIAGNOSTIC IMAGING:  I have independently reviewed the scans and discussed with the patient. IR IMAGING GUIDED PORT INSERTION  Result Date: 07/26/2019 INDICATION: 74 year old female with a history of ovarian carcinoma referred for port catheter EXAM: IMAGE GUIDED PORT CATHETER MEDICATIONS: 1 g vancomycin; The antibiotic was administered within an appropriate time interval prior to skin puncture. ANESTHESIA/SEDATION: Moderate (conscious) sedation was employed during this procedure. A total of  Versed 1.0 mg and Fentanyl 50 mcg was administered intravenously. Moderate Sedation Time: 18 minutes. The patient's level of consciousness and vital signs were monitored continuously by radiology nursing throughout the procedure under my direct supervision. FLUOROSCOPY TIME:  Fluoroscopy Time: 0 minutes 12 seconds (1 mGy). COMPLICATIONS: None PROCEDURE: The procedure, risks, benefits, and alternatives were explained to the patient. Questions regarding the procedure were encouraged and answered. The patient understands and consents to the procedure. Ultrasound survey was performed with images stored and sent to PACs. The right neck and chest was prepped with chlorhexidine, and draped in the usual sterile fashion using maximum barrier technique (cap and mask, sterile gown, sterile gloves, large sterile sheet, hand hygiene and cutaneous antiseptic). Antibiotic prophylaxis was provided with 2.0g Ancef administered IV one hour prior to skin incision. Local anesthesia was attained by infiltration with 1% lidocaine without epinephrine. Ultrasound demonstrated patency of the right internal jugular vein, and this was documented with an image. Under real-time ultrasound guidance, this vein was accessed with a 21 gauge micropuncture needle and image documentation was performed. A small dermatotomy was made at the access site with an 11 scalpel. A 0.018" wire was advanced into the SVC and used to estimate the length of the internal catheter. The access needle exchanged for a 71F micropuncture vascular sheath. The 0.018" wire was then removed and a 0.035" wire advanced into the IVC. An appropriate location for the subcutaneous reservoir was selected below the clavicle and an incision was made through the skin and underlying soft tissues. The subcutaneous tissues were then dissected using a combination of blunt and sharp surgical technique and a pocket was formed. A single lumen power injectable portacatheter was then tunneled  through the subcutaneous tissues from the pocket to the dermatotomy and the port reservoir placed within the subcutaneous pocket. The venous access site was then serially dilated and a peel away vascular sheath placed over the wire. The wire was removed and the port catheter advanced into position under fluoroscopic guidance. The catheter tip is positioned in the cavoatrial junction. This was documented with a spot image. The portacatheter was then tested and found to flush and aspirate well. The port was flushed with saline followed by 100 units/mL heparinized saline. The pocket was then closed in two layers using first subdermal inverted interrupted absorbable sutures followed by a running subcuticular suture. The epidermis was then sealed with Dermabond. The dermatotomy at the venous access site was also seal with Dermabond. Patient tolerated the procedure well and remained hemodynamically stable throughout. No complications encountered and no significant blood loss encountered IMPRESSION: Status post right IJ port catheter placement. Signed, Dulcy Fanny. Dellia Nims, RPVI [facility] Electronically Signed   By: Corrie Mckusick D.O.   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 Mass measures 6 x 5.8  cm and is partially calcified. Separate solid component superior to this in the left periaortic region measures 3.5 cm, SUV 14.3. Cystic component medial to the lower pole of the left kidney is without hypermetabolic activity, possibly a lymphocele. -CT-guided biopsy of the right retroperitoneal lymph node consistent with adenocarcinoma with psammoma bodies. Morphology and immunotherapy consistent with ovarian serous carcinoma/primary peritoneal carcinoma. -Germline mutation testing was  negative. -Foundation 1 testing shows MS-stable. Loss of heterozygosity was less than 16%. -She met with Dr. Denman George who recommended neoadjuvant chemotherapy with 3-6 cycles of carboplatin and paclitaxel with interim staging after 3 cycles. If there is substantial response unless encasement of the aorta and vena cava, consideration for debulking procedure can be possible at that time. -CA-125 346 on 06/23/2019. -Cycle 1 of carboplatin and paclitaxel on 07/28/2019.   PLAN:  1. Ovarian serous carcinoma: -She has tolerated her first cycle reasonably well.  She had some achiness in the shoulders after the Neulasta injection.  She also complained of some restlessness at nighttime with bad dreams and sleeping problem. -I reviewed her labs.  White count is elevated due to Neulasta.  Creatinine is mildly elevated at 1.13.  I have recommended drinking 2 to 3 L of water.  LFTs are normal. -She will come back in 2 weeks for her cycle 2.  Depending on how she feels, we may plan to increase the dose of paclitaxel to 175 mg per metered squared. -We will consider changing Compazine to Zofran she continues to have restlessness.  2. Low back pain: -Continue MSIR 15 mg as needed.  3. Constipation: -Continue Senokot 1 tablet daily.  4. Hypertension: -Continue Norvasc and losartan/HCTZ.  5. Leg swelling: -This has improved.  She is taking Lasix as needed.   Orders placed this encounter:  No orders of the defined types were placed in this encounter.    Derek Jack, MD [place] [phone]   I, Milinda Antis, am acting as a scribe for Dr. Sanda Linger.  I, Derek Jack MD, have reviewed the above documentation for accuracy and completeness, and I agree with the above.

## 2019-08-04 NOTE — Patient Instructions (Signed)
[facility] at [facility] Discharge Instructions  You were seen today by Dr. Delton Coombes. He talked with you about how you have been feeling lately. He wants you to continue taking the Claritin to help with you with your body pains.  These pains are coming from the shot we gave you the day after treatment to boost your blood counts.    For your pain, Dr. Raliegh Ip wants you to continue taking the morphine to help keep it under control.    Dr. Raliegh Ip talked with you about your restlessness.  The compazine can have side effects that include restlessness.  Dr. Raliegh Ip wants you to stop taking the compazine daily and see if that helps your restlessness.  If it does help decrease the side effects, then we will switch your nausea medication.   Since your swelling in the legs is better, you can stop taking the fluid pills. This will also help your urinary urgency.    We will see you back in 2 weeks for your next treatment with labs.    Thank you for choosing [place] at [facility] to provide your oncology and hematology care.  To afford each patient quality time with our provider, please arrive at least 15 minutes before your scheduled appointment time.   If you have a lab appointment with the [facility] please come in thru the Main Entrance and check in at the main information desk.  You need to re-schedule your appointment should you arrive 10 or more minutes late.  We strive to give you quality time with our providers, and arriving late affects you and other patients whose appointments are after yours.  Also, if you no show three or more times for appointments you may be dismissed from the clinic at the providers discretion.     Again, thank you for choosing [facility]  Our hope is that these requests will decrease the amount of time that you wait before being seen by our physicians.        _____________________________________________________________  Should you have questions after your visit to [facility], please contact our office at [phone] and follow the prompts.  Our office hours are 8:00 a.m. and 4:30 p.m. Monday - Friday.  Please note that voicemails left after 4:00 p.m. may not be returned until the following business day.  We are closed weekends and major holidays.  You do have access to a nurse 24-7, just call the main number to the clinic [phone] and do not press any options, hold on the line and a nurse will answer the phone.    For prescription refill requests, have your pharmacy contact our office and allow 72 hours.    Due to Covid, you will need to wear a mask upon entering the hospital. If you do not have a mask, a mask will be given to you at the Main Entrance upon arrival. For doctor visits, patients may have 1 support person age 16 or older with them. For treatment visits, patients can not have anyone with them due to social distancing guidelines and our immunocompromised population.

## 2019-08-06 ENCOUNTER — Encounter (HOSPITAL_COMMUNITY): Payer: Self-pay

## 2019-08-08 ENCOUNTER — Other Ambulatory Visit (HOSPITAL_COMMUNITY): Payer: Self-pay

## 2019-08-08 MED ORDER — GABAPENTIN 100 MG PO CAPS
100.0000 mg | ORAL_CAPSULE | Freq: Every day | ORAL | 0 refills | Status: DC
Start: 2019-08-08 — End: 2019-08-18

## 2019-08-08 NOTE — Telephone Encounter (Signed)
Message received from patient complaining of restlessness, especially at night. Dr. Delton Coombes aware. Prescription sent for gabapentin 100mg  [facility] per Dr. Tomie China verbal order. Patient aware.

## 2019-08-09 ENCOUNTER — Other Ambulatory Visit (HOSPITAL_COMMUNITY): Payer: Self-pay | Admitting: *Deleted

## 2019-08-09 DIAGNOSIS — R1900 Intra-abdominal and pelvic swelling, mass and lump, unspecified site: Secondary | ICD-10-CM

## 2019-08-09 DIAGNOSIS — C569 Malignant neoplasm of unspecified ovary: Secondary | ICD-10-CM

## 2019-08-10 ENCOUNTER — Other Ambulatory Visit (HOSPITAL_COMMUNITY): Payer: Self-pay | Admitting: *Deleted

## 2019-08-10 DIAGNOSIS — R1900 Intra-abdominal and pelvic swelling, mass and lump, unspecified site: Secondary | ICD-10-CM

## 2019-08-10 DIAGNOSIS — C569 Malignant neoplasm of unspecified ovary: Secondary | ICD-10-CM

## 2019-08-10 MED ORDER — MORPHINE SULFATE 15 MG PO TABS: 15.0000 mg | ORAL_TABLET | Freq: Two times a day (BID) | ORAL | 0 refills | Status: DC | PRN

## 2019-08-12 ENCOUNTER — Ambulatory Visit (HOSPITAL_COMMUNITY): Payer: Medicare Other

## 2019-08-12 NOTE — Progress Notes (Signed)
Nutrition Assessment   Reason for Assessment:  Poor appetite   ASSESSMENT:  74 year old female with ovarian cancer.  Past medical history of DM, HTN, HLD.  Patient is receiving neoadjuvant chemotherapy with plans for possible surgery after.   Spoke with patient via phone.  Patient reports for the past week or so appetite has been really good.  However prior to that would eat few bites then feel full.  Reports constipation is better with taking senokot.  Reports some issues with nausea and 1 episode of vomiting.  Has been trying to take compazine.  Reports yesterday was able to eat 1/2 egg sandwich for breakfast and 1/2 cheese and ham sandwich for lunch.  Dinner last night at 5 oz of hamburger steak, green beans and mashed potatoes.  Likes to snack on fruit, nuts and popsciles.  Has tried boost but reports did not sit well on stomach.  Drinks lactaid milk, eats some cheese and yogurt.    Medications:  Lasix, amaryl, vit B 12 and D, protonix, senokot, compazine   Labs: glucose 132, creatinine 1.13   Anthropometrics:   Height: 64 inches Weight: 192 lb 7/22 Weight has fluctuated between 193 lb and 199lb.  Patient reports that she has issues with fluid collecting in feet and ankles and feels like 199 lb she had extra fluid BMI: 32  Stable weight considering fluid gain/loss  Estimated Energy Needs  Kcals: 1900-2175 Protein: 95-108 g Fluid: > 1.9 L   NUTRITION DIAGNOSIS:to be determined  INTERVENTION:  Discussed importance of nutrition during treatment and weight maintenance Discussed strategies to help with GERD Discussed importance of taking nausea medications Discussed importance of eating protein rich foods at every meal.  Discussed ways to try boost to see if it will not hurt stomach.  Contact information provided   MONITORING, EVALUATION, GOAL: weight trends, intake   Next Visit: August 27, phone f/u  Vianney Kopecky B. Zenia Resides, [place], [place] Registered Dietitian [phone]  (mobile)

## 2019-08-16 ENCOUNTER — Other Ambulatory Visit: Payer: Self-pay | Admitting: Internal Medicine

## 2019-08-16 NOTE — Telephone Encounter (Signed)
Please refill as per office routine med refill policy (all routine meds refilled for 3 mo or monthly per pt preference up to one year from last visit, then month to month grace period for 3 mo, then further med refills will have to be denied)  

## 2019-08-17 ENCOUNTER — Encounter (HOSPITAL_COMMUNITY): Payer: Self-pay

## 2019-08-17 ENCOUNTER — Other Ambulatory Visit (HOSPITAL_COMMUNITY): Payer: Self-pay | Admitting: Nurse Practitioner

## 2019-08-18 ENCOUNTER — Inpatient Hospital Stay (HOSPITAL_COMMUNITY): Payer: Medicare Other

## 2019-08-18 ENCOUNTER — Inpatient Hospital Stay (HOSPITAL_BASED_OUTPATIENT_CLINIC_OR_DEPARTMENT_OTHER): Payer: Medicare Other | Admitting: Hematology

## 2019-08-18 ENCOUNTER — Ambulatory Visit (HOSPITAL_COMMUNITY)
Admission: RE | Admit: 2019-08-18 | Discharge: 2019-08-18 | Disposition: A | Discharge status: Home / Self Care | Payer: Medicare Other | Source: Ambulatory Visit | Attending: Hematology | Admitting: Hematology

## 2019-08-18 ENCOUNTER — Other Ambulatory Visit: Payer: Self-pay

## 2019-08-18 ENCOUNTER — Inpatient Hospital Stay (HOSPITAL_COMMUNITY): Payer: Medicare Other | Attending: Hematology

## 2019-08-18 VITALS — BP 150/80 | HR 73 | Temp 97.9°F | Resp 18

## 2019-08-18 VITALS — HR 84 | Temp 97.1°F | Resp 20 | Wt 196.6 lb

## 2019-08-18 DIAGNOSIS — G4700 Insomnia, unspecified: Secondary | ICD-10-CM | POA: Insufficient documentation

## 2019-08-18 DIAGNOSIS — I10 Essential (primary) hypertension: Secondary | ICD-10-CM | POA: Insufficient documentation

## 2019-08-18 DIAGNOSIS — K5900 Constipation, unspecified: Secondary | ICD-10-CM | POA: Diagnosis not present

## 2019-08-18 DIAGNOSIS — Z9989 Dependence on other enabling machines and devices: Secondary | ICD-10-CM | POA: Diagnosis not present

## 2019-08-18 DIAGNOSIS — E669 Obesity, unspecified: Secondary | ICD-10-CM | POA: Diagnosis not present

## 2019-08-18 DIAGNOSIS — Z5111 Encounter for antineoplastic chemotherapy: Secondary | ICD-10-CM | POA: Diagnosis not present

## 2019-08-18 DIAGNOSIS — Z5189 Encounter for other specified aftercare: Secondary | ICD-10-CM | POA: Diagnosis not present

## 2019-08-18 DIAGNOSIS — C569 Malignant neoplasm of unspecified ovary: Secondary | ICD-10-CM | POA: Diagnosis present

## 2019-08-18 DIAGNOSIS — M1990 Unspecified osteoarthritis, unspecified site: Secondary | ICD-10-CM | POA: Insufficient documentation

## 2019-08-18 DIAGNOSIS — R600 Localized edema: Secondary | ICD-10-CM | POA: Diagnosis not present

## 2019-08-18 DIAGNOSIS — E119 Type 2 diabetes mellitus without complications: Secondary | ICD-10-CM | POA: Diagnosis not present

## 2019-08-18 DIAGNOSIS — R05 Cough: Secondary | ICD-10-CM | POA: Insufficient documentation

## 2019-08-18 DIAGNOSIS — Z79899 Other long term (current) drug therapy: Secondary | ICD-10-CM | POA: Insufficient documentation

## 2019-08-18 DIAGNOSIS — M25571 Pain in right ankle and joints of right foot: Secondary | ICD-10-CM

## 2019-08-18 DIAGNOSIS — Z95828 Presence of other vascular implants and grafts: Secondary | ICD-10-CM

## 2019-08-18 DIAGNOSIS — E785 Hyperlipidemia, unspecified: Secondary | ICD-10-CM | POA: Diagnosis not present

## 2019-08-18 DIAGNOSIS — F419 Anxiety disorder, unspecified: Secondary | ICD-10-CM | POA: Diagnosis not present

## 2019-08-18 DIAGNOSIS — M545 Low back pain: Secondary | ICD-10-CM | POA: Diagnosis not present

## 2019-08-18 DIAGNOSIS — M25471 Effusion, right ankle: Secondary | ICD-10-CM

## 2019-08-18 DIAGNOSIS — G4730 Sleep apnea, unspecified: Secondary | ICD-10-CM | POA: Diagnosis not present

## 2019-08-18 LAB — COMPREHENSIVE METABOLIC PANEL
ALT: 15 U/L (ref 0–44)
AST: 17 U/L (ref 15–41)
Albumin: 4.2 g/dL (ref 3.5–5.0)
Alkaline Phosphatase: 61 U/L (ref 38–126)
Anion gap: 10 (ref 5–15)
BUN: 15 mg/dL (ref 8–23)
CO2: 23 mmol/L (ref 22–32)
Calcium: 9.3 mg/dL (ref 8.9–10.3)
Chloride: 102 mmol/L (ref 98–111)
Creatinine, Ser: 0.82 mg/dL (ref 0.44–1.00)
GFR calc Af Amer: >60 mL/min (ref >60)
GFR calc non Af Amer: >60 mL/min (ref >60)
Glucose, Bld: 177 mg/dL — ABNORMAL HIGH (ref 70–99)
Potassium: 3.8 mmol/L (ref 3.5–5.1)
Sodium: 135 mmol/L (ref 135–145)
Total Bilirubin: 0.4 mg/dL (ref 0.3–1.2)
Total Protein: 6.8 g/dL (ref 6.5–8.1)

## 2019-08-18 LAB — CBC WITH DIFFERENTIAL/PLATELET
Abs Immature Granulocytes: 0.06 10*3/uL (ref 0.00–0.07)
Basophils Absolute: 0.1 10*3/uL (ref 0.0–0.1)
Basophils Relative: 1 %
Eosinophils Absolute: 0.1 10*3/uL (ref 0.0–0.5)
Eosinophils Relative: 1 %
HCT: 32.4 % — ABNORMAL LOW (ref 36.0–46.0)
Hemoglobin: 10.8 g/dL — ABNORMAL LOW (ref 12.0–15.0)
Immature Granulocytes: 1 %
Lymphocytes Relative: 26 %
Lymphs Abs: 2 10*3/uL (ref 0.7–4.0)
MCH: 29.4 pg (ref 26.0–34.0)
MCHC: 33.3 g/dL (ref 30.0–36.0)
MCV: 88.3 fL (ref 80.0–100.0)
Monocytes Absolute: 0.6 10*3/uL (ref 0.1–1.0)
Monocytes Relative: 8 %
Neutro Abs: 4.9 10*3/uL (ref 1.7–7.7)
Neutrophils Relative %: 63 %
Platelets: 210 10*3/uL (ref 150–400)
RBC: 3.67 MIL/uL — ABNORMAL LOW (ref 3.87–5.11)
RDW: 14.7 % (ref 11.5–15.5)
WBC: 7.7 10*3/uL (ref 4.0–10.5)
nRBC: 0 % (ref 0.0–0.2)

## 2019-08-18 MED ORDER — DIPHENHYDRAMINE HCL 50 MG/ML IJ SOLN
INTRAMUSCULAR | Status: AC
  Filled 2019-08-18: qty 1

## 2019-08-18 MED ORDER — SODIUM CHLORIDE 0.9 % IV SOLN
150.0000 mg | Freq: Once | INTRAVENOUS | Status: AC
  Administered 2019-08-18: 150 mg via INTRAVENOUS
  Filled 2019-08-18: qty 150

## 2019-08-18 MED ORDER — DIPHENHYDRAMINE HCL 50 MG/ML IJ SOLN
50.0000 mg | Freq: Once | INTRAMUSCULAR | Status: AC
  Administered 2019-08-18: 50 mg via INTRAVENOUS

## 2019-08-18 MED ORDER — SODIUM CHLORIDE 0.9 % IV SOLN
150.0000 mg/m2 | Freq: Once | INTRAVENOUS | Status: AC
  Administered 2019-08-18: 300 mg via INTRAVENOUS
  Filled 2019-08-18: qty 50

## 2019-08-18 MED ORDER — PALONOSETRON HCL INJECTION 0.25 MG/5ML
INTRAVENOUS | Status: AC
  Filled 2019-08-18: qty 5

## 2019-08-18 MED ORDER — FAMOTIDINE IN NACL 20-0.9 MG/50ML-% IV SOLN
20.0000 mg | Freq: Once | INTRAVENOUS | Status: AC
  Administered 2019-08-18: 20 mg via INTRAVENOUS

## 2019-08-18 MED ORDER — SODIUM CHLORIDE 0.9% FLUSH
10.0000 mL | INTRAVENOUS | Status: DC | PRN
  Administered 2019-08-18: 10 mL

## 2019-08-18 MED ORDER — SODIUM CHLORIDE 0.9 % IV SOLN
10.0000 mg | Freq: Once | INTRAVENOUS | Status: AC
  Administered 2019-08-18: 10 mg via INTRAVENOUS
  Filled 2019-08-18: qty 10

## 2019-08-18 MED ORDER — PEGFILGRASTIM 6 MG/0.6ML ~~LOC~~ PSKT
6.0000 mg | PREFILLED_SYRINGE | Freq: Once | SUBCUTANEOUS | Status: AC
  Administered 2019-08-18: 6 mg via SUBCUTANEOUS
  Filled 2019-08-18: qty 0.6

## 2019-08-18 MED ORDER — SODIUM CHLORIDE 0.9 % IV SOLN: Freq: Once | INTRAVENOUS | Status: AC

## 2019-08-18 MED ORDER — FAMOTIDINE IN NACL 20-0.9 MG/50ML-% IV SOLN
INTRAVENOUS | Status: AC
  Filled 2019-08-18: qty 50

## 2019-08-18 MED ORDER — PALONOSETRON HCL INJECTION 0.25 MG/5ML
0.2500 mg | Freq: Once | INTRAVENOUS | Status: AC
  Administered 2019-08-18: 0.25 mg via INTRAVENOUS

## 2019-08-18 MED ORDER — SODIUM CHLORIDE 0.9 % IV SOLN
568.2000 mg | Freq: Once | INTRAVENOUS | Status: AC
  Administered 2019-08-18: 570 mg via INTRAVENOUS
  Filled 2019-08-18: qty 57

## 2019-08-18 MED ORDER — HEPARIN SOD (PORK) LOCK FLUSH 100 UNIT/ML IV SOLN
500.0000 [IU] | Freq: Once | INTRAVENOUS | Status: AC | PRN
  Administered 2019-08-18: 500 [IU]

## 2019-08-18 NOTE — Progress Notes (Signed)
[place] [place], [place] [address]   CLINIC:  Medical Oncology/Hematology  PCP:  Biagio Borg, MD [place] / [place] [address] [phone]   REASON FOR VISIT:  Follow-up for ovarian cancer  PRIOR THERAPY: None  NGS Results: Foundation 1 MS--stable  CURRENT THERAPY: Carboplatin and paclitaxel  BRIEF ONCOLOGIC HISTORY:  Oncology History  Primary ovarian adenocarcinoma, unspecified laterality ([place])  06/20/2019 Initial Diagnosis   Primary ovarian adenocarcinoma, unspecified laterality ([place])   07/01/2019 Genetic Testing   Foundation One     07/11/2019 Genetic Testing   Negative genetic testing:  No pathogenic variants detected on the [facility] Multi-Cancer Panel. The report date is 07/11/2019.  The Multi-Cancer Panel offered by [facility] includes sequencing and/or deletion duplication testing of the following 85 genes: AIP, ALK, APC, ATM, AXIN2,BAP1,  BARD1, BLM, BMPR1A, BRCA1, BRCA2, BRIP1, CASR, CDC73, CDH1, CDK4, CDKN1B, CDKN1C, CDKN2A (p14ARF), CDKN2A (p16INK4a), CEBPA, CHEK2, CTNNA1, DICER1, DIS3L2, EGFR (c.2369C>T, p.Thr790Met variant only), EPCAM (Deletion/duplication testing only), FH, FLCN, GATA2, GPC3, GREM1 (Promoter region deletion/duplication testing only), HOXB13 (c.251G>A, p.Gly84Glu), HRAS, KIT, MAX, MEN1, MET, MITF (c.952G>A, p.Glu318Lys variant only), MLH1, MSH2, MSH3, MSH6, MUTYH, NBN, NF1, NF2, NTHL1, PALB2, PDGFRA, PHOX2B, PMS2, POLD1, POLE, POT1, PRKAR1A, PTCH1, PTEN, RAD50, RAD51C, RAD51D, RB1, RECQL4, RET, RNF43, RUNX1, SDHAF2, SDHA (sequence changes only), SDHB, SDHC, SDHD, SMAD4, SMARCA4, SMARCB1, SMARCE1, STK11, SUFU, TERC, TERT, TMEM127, TP53, TSC1, TSC2, VHL, WRN and WT1.   07/28/2019 -  Chemotherapy   The patient had palonosetron (ALOXI) injection 0.25 mg, 0.25 mg, Intravenous,  Once, 1 of 6 cycles Administration: 0.25 mg (07/28/2019) pegfilgrastim (NEULASTA ONPRO KIT) injection 6 mg, 6 mg, Subcutaneous, Once, 1 of  6 cycles Administration: 6 mg (07/28/2019) CARBOplatin (PARAPLATIN) 470 mg in sodium chloride 0.9 % 250 mL chemo infusion, 470 mg (100 % of original dose 470 mg), Intravenous,  Once, 1 of 6 cycles Dose modification:   (original dose 470 mg, Cycle 1, Reason: Provider Judgment) Administration: 470 mg (07/28/2019) fosaprepitant (EMEND) 150 mg in sodium chloride 0.9 % 145 mL IVPB, 150 mg, Intravenous,  Once, 1 of 6 cycles Administration: 150 mg (07/28/2019) PACLitaxel (TAXOL) 282 mg in sodium chloride 0.9 % 250 mL chemo infusion (> 75m/m2), 140 mg/m2 = 282 mg (80 % of original dose 175 mg/m2), Intravenous,  Once, 1 of 6 cycles Dose modification: 140 mg/m2 (80 % of original dose 175 mg/m2, Cycle 1, Reason: Provider Judgment) Administration: 282 mg (07/28/2019)  for chemotherapy treatment.      CANCER STAGING: Cancer Staging No matching staging information was found for the patient.  INTERVAL HISTORY:  Ms. MMINDIE RAWDON a 74y.o. female, returns for routine follow-up and consideration for next cycle of chemotherapy. [place] last seen on 08/04/2019.  Due for cycle #2 of carboplatin and paclitaxel today.   Overall, today she tells me she has been feeling pretty well. She reports that her hair started falling out on 8/1 and she complains of medial right ankle pain for the past 3 months. She complains of difficulty staying asleep because of "crazy dreams" and she stopped taking the gabapentin due to drowsiness and anxiety; her energy levels are higher and she claims that her energy is high which prevents her from falling back asleep. She denies any numbness or tingling in her hands or feet. She denies postprandial abdominal pain.  Overall, she feels ready for next cycle of chemo today.    REVIEW OF SYSTEMS:  Review of Systems  Constitutional: Negative for appetite change and fatigue.  Respiratory: Positive for cough.   Cardiovascular: Positive for leg swelling.  Gastrointestinal: Positive for  diarrhea (d/t laxative). Negative for abdominal pain.  Musculoskeletal: Positive for arthralgias (4/10 R ankle pain).  Neurological: Positive for dizziness. Negative for numbness.  Psychiatric/Behavioral: Positive for sleep disturbance (trouble staying asleep).  All other systems reviewed and are negative.   PAST MEDICAL/SURGICAL HISTORY:  Past Medical History:  Diagnosis Date  . Anxiety   . Arthritis    back of neck, bones spurs on neck  . Cataract   . Cervical disc disease   . Diabetes mellitus   . Family history of thyroid cancer   . Hyperlipidemia   . Hypertension   . Mucoid cyst of joint    right thumb  . Neuropathy   . Port-A-Cath in place 07/21/2019  . Reflux   . Sleep apnea    wears CPAP nightly  . Vertigo    Past Surgical History:  Procedure Laterality Date  . BLADDER SURGERY    . BREAST EXCISIONAL BIOPSY Bilateral   . BREAST SURGERY    . CHOLECYSTECTOMY    . fibroids removed     breast (both breasts)  . IR IMAGING GUIDED PORT INSERTION  07/26/2019  . MASS EXCISION Right 06/26/2016   Procedure: EXCISION MUCOID TUMOR RIGHT THUMB, IP RIGHT THUMB;  Surgeon: Daryll Brod, MD;  Location: [facility];  Service: Orthopedics;  Laterality: Right;  . PARTIAL HYSTERECTOMY      SOCIAL HISTORY:  Social History   Socioeconomic History  . Marital status: Divorced    Spouse name: Not on file  . Number of children: 4  . Years of education: 82  . Highest education level: Not on file  Occupational History  . Occupation: retired Geographical information systems officer  Tobacco Use  . Smoking status: Never Smoker  . Smokeless tobacco: Never Used  Vaping Use  . Vaping Use: Never used  Substance and Sexual Activity  . Alcohol use: No    Alcohol/week: 0.0 standard drinks  . Drug use: No  . Sexual activity: Not Currently  Other Topics Concern  . Not on file  Social History Narrative  . Not on file   Social Determinants of Health   Financial Resource Strain: Low Risk     . Difficulty of Paying Living Expenses: Not hard at all  Food Insecurity: No Food Insecurity  . Worried About Charity fundraiser in the Last Year: Never true  . Ran Out of Food in the Last Year: Never true  Transportation Needs: No Transportation Needs  . Lack of Transportation (Medical): No  . Lack of Transportation (Non-Medical): No  Physical Activity: Inactive  . Days of Exercise per Week: 0 days  . Minutes of Exercise per Session: 0 min  Stress: No Stress Concern Present  . Feeling of Stress : Not at all  Social Connections: Moderately Integrated  . Frequency of Communication with Friends and Family: More than three times a week  . Frequency of Social Gatherings with Friends and Family: More than three times a week  . Attends Religious Services: More than 4 times per year  . Active Member of Clubs or Organizations: Yes  . Attends Archivist Meetings: Never  . Marital Status: Divorced  Human resources officer Violence: Not At Risk  . Fear of Current or Ex-Partner: No  . Emotionally Abused: No  . Physically Abused: No  . Sexually Abused: No  FAMILY HISTORY:  Family History  Problem Relation Age of Onset  . Hypertension Other   . Diabetes Father   . Hypertension Father   . Diabetes Sister   . Hypertension Mother   . Hypertension Sister   . Stroke Sister   . Diabetes Maternal Aunt   . Thyroid cancer Daughter 1  . Colon cancer Neg Hx   . Esophageal cancer Neg Hx   . Stomach cancer Neg Hx   . Rectal cancer Neg Hx   . Breast cancer Neg Hx   . Endometrial cancer Neg Hx   . Ovarian cancer Neg Hx     CURRENT MEDICATIONS:  Current Outpatient Medications  Medication Sig Dispense Refill  . acetaminophen (TYLENOL) 500 MG tablet Take 500 mg by mouth 2 (two) times daily as needed for moderate pain or headache.     Marland Kitchen amLODipine (NORVASC) 5 MG tablet TAKE ONE (1) TABLET BY MOUTH EVERY DAY (Patient taking differently: Take 5 mg by mouth daily. ) 90 tablet 1  . Blood  Glucose Monitoring Suppl (ONE TOUCH ULTRA 2) w/Device KIT Use as directed 1 each 0  . CARBOPLATIN IV Inject into the vein every 21 ( twenty-one) days.    . cetirizine (ZYRTEC) 10 MG tablet Take 10 mg by mouth daily.    . cholecalciferol (VITAMIN D3) 25 MCG (1000 UNIT) tablet Take 2,000 Units by mouth daily.    Marland Kitchen glimepiride (AMARYL) 2 MG tablet TAKE ONE TABLET BY MOUTH DAILY 90 tablet 3  . Lancets (ONETOUCH ULTRASOFT) lancets USE TO CHECK BLOOD SUGAR TWICE DAILY 100 each 3  . loperamide (IMODIUM) 2 MG capsule Take by mouth as needed for diarrhea or loose stools.     Marland Kitchen losartan-hydrochlorothiazide (HYZAAR) 100-12.5 MG tablet Take 1 tablet by mouth daily. 90 tablet 3  . morphine (MSIR) 15 MG tablet Take 1 tablet (15 mg total) by mouth 2 (two) times daily as needed for severe pain. 30 tablet 0  . ONETOUCH ULTRA test strip USE TO CHECK BLOOD SUGARS TWO TIMES DAILY 100 strip 3  . OVER THE COUNTER MEDICATION Apply 1 application topically daily as needed (pain). Triderma otc pain cream     . PACLITAXEL IV Inject 175 mg/m2 into the vein every 21 ( twenty-one) days.    . Pegfilgrastim (NEULASTA ONPRO [place]) Inject into the skin every 21 ( twenty-one) days.    . sennosides-docusate sodium (SENOKOT-S) 8.6-50 MG tablet Take 2 tablets by mouth daily.    . vitamin B-12 (CYANOCOBALAMIN) 1000 MCG tablet Take 2,000 mcg by mouth daily.    . furosemide (LASIX) 20 MG tablet Take 1 tablet (20 mg total) by mouth daily. (Patient not taking: Reported on 08/18/2019) 30 tablet 2  . Lactulose 20 GM/30ML SOLN Take 30 mLs (20 g total) by mouth at bedtime. (Patient not taking: Reported on 07/20/2019) 480 mL 3  . lidocaine-prilocaine (EMLA) cream Apply a small amount to port a cath site and cover with plastic wrap 1 hour prior to chemotherapy appointments (Patient not taking: Reported on 08/18/2019) 30 g 3  . pantoprazole (PROTONIX) 40 MG tablet TAKE 1 TABLET (40 MG TOTAL) BY MOUTH DAILY. (Patient not taking: Reported on 08/04/2019) 90  tablet 1  . prochlorperazine (COMPAZINE) 10 MG tablet Take 1 tablet (10 mg total) by mouth every 6 (six) hours as needed (Nausea or vomiting). (Patient not taking: Reported on 08/18/2019) 30 tablet 1   No current facility-administered medications for this visit.    ALLERGIES:  Allergies  Allergen Reactions  . Hydrocodone-Homatropine Other (See Comments)    Vertigo *pt strongly prefers to never take*  . Sulfa Antibiotics Hives, Itching and Swelling    Tongue swells  . Augmentin [Amoxicillin-Pot Clavulanate]     Diarrhea; can take PCN/ Amoxicillin  . Codeine Itching  . Crestor [Rosuvastatin Calcium] Other (See Comments)    Did something to memory    . Doxycycline Other (See Comments)    Severe rectal Gas.  . Gabapentin     disoriented  . Keflex [Cephalexin] Diarrhea and Nausea And Vomiting  . Naproxen Other (See Comments)    Stomach cramps  . Statins     Muscle weakness  . Prednisone Anxiety    *pt strongly prefers to never be given prednisone*     PHYSICAL EXAM:  Performance status (ECOG): 1 - Symptomatic but completely ambulatory  Vitals:   08/18/19 0818  Pulse: 84  Resp: 20  Temp: (!) 97.1 F (36.2 C)  SpO2: 100%   Wt Readings from Last 3 Encounters:  08/04/19 192 lb (87.1 kg)  08/01/19 199 lb (90.3 kg)  07/28/19 199 lb 9.6 oz (90.5 kg)   Physical Exam Vitals reviewed.  Constitutional:      Appearance: Normal appearance. She is obese.  Cardiovascular:     Rate and Rhythm: Normal rate and regular rhythm.     Pulses: Normal pulses.     Heart sounds: Normal heart sounds.  Pulmonary:     Effort: Pulmonary effort is normal.     Breath sounds: Normal breath sounds.  Chest:     Comments: Port-a-Cath on R chest Abdominal:     Palpations: Abdomen is soft. There is no mass.     Tenderness: There is no abdominal tenderness.  Neurological:     General: No focal deficit present.     Mental Status: She is alert and oriented to person, place, and time.     Gait:  Gait abnormal (d/t R ankle pain).  Psychiatric:        Mood and Affect: Mood normal.        Behavior: Behavior normal.     LABORATORY DATA:  I have reviewed the labs as listed.  CBC Latest Ref Rng & Units 08/18/2019 08/04/2019 07/28/2019  WBC 4.0 - 10.5 K/uL 7.7 22.3(H) 7.3  Hemoglobin 12.0 - 15.0 g/dL 10.8(L) 11.4(L) 11.5(L)  Hematocrit 36 - 46 % 32.4(L) 34.8(L) 35.0(L)  Platelets 150 - 400 K/uL 210 208 226   CMP Latest Ref Rng & Units 08/18/2019 08/04/2019 07/28/2019  Glucose 70 - 99 mg/dL 177(H) 132(H) 114(H)  BUN 8 - 23 mg/dL _0 Creatinine 0.44 - 1.00 mg/dL 0.82 1.13(H) 1.01(H)  Sodium 135 - 145 mmol/L 135 133(L) 133(L)  Potassium 3.5 - 5.1 mmol/L 3.8 3.6 3.8  Chloride 98 - 111 mmol/L 102 97(L) 100  CO2 22 - 32 mmol/L _1 Calcium 8.9 - 10.3 mg/dL 9.3 9.6 9.8  Total Protein 6.5 - 8.1 g/dL 6.8 7.2 7.6  Total Bilirubin 0.3 - 1.2 mg/dL 0.4 0.4 0.4  Alkaline Phos 38 - 126 U/L 61 96 61  AST 15 - 41 U/L _2 ALT 0 - 44 U/L _3 Lab Results  Component Value Date   LDH 148 04/05/2019    DIAGNOSTIC IMAGING:  I have independently reviewed the scans and discussed with the patient. IR IMAGING GUIDED PORT INSERTION  Result Date: 07/26/2019 INDICATION: 74 year old female with a history of ovarian  carcinoma referred for port catheter EXAM: IMAGE GUIDED PORT CATHETER MEDICATIONS: 1 g vancomycin; The antibiotic was administered within an appropriate time interval prior to skin puncture. ANESTHESIA/SEDATION: Moderate (conscious) sedation was employed during this procedure. A total of Versed 1.0 mg and Fentanyl 50 mcg was administered intravenously. Moderate Sedation Time: 18 minutes. The patient's level of consciousness and vital signs were monitored continuously by radiology nursing throughout the procedure under my direct supervision. FLUOROSCOPY TIME:  Fluoroscopy Time: 0 minutes 12 seconds (1 mGy). COMPLICATIONS: None PROCEDURE: The procedure, risks, benefits, and  alternatives were explained to the patient. Questions regarding the procedure were encouraged and answered. The patient understands and consents to the procedure. Ultrasound survey was performed with images stored and sent to PACs. The right neck and chest was prepped with chlorhexidine, and draped in the usual sterile fashion using maximum barrier technique (cap and mask, sterile gown, sterile gloves, large sterile sheet, hand hygiene and cutaneous antiseptic). Antibiotic prophylaxis was provided with 2.0g Ancef administered IV one hour prior to skin incision. Local anesthesia was attained by infiltration with 1% lidocaine without epinephrine. Ultrasound demonstrated patency of the right internal jugular vein, and this was documented with an image. Under real-time ultrasound guidance, this vein was accessed with a 21 gauge micropuncture needle and image documentation was performed. A small dermatotomy was made at the access site with an 11 scalpel. A 0.018" wire was advanced into the SVC and used to estimate the length of the internal catheter. The access needle exchanged for a 101F micropuncture vascular sheath. The 0.018" wire was then removed and a 0.035" wire advanced into the IVC. An appropriate location for the subcutaneous reservoir was selected below the clavicle and an incision was made through the skin and underlying soft tissues. The subcutaneous tissues were then dissected using a combination of blunt and sharp surgical technique and a pocket was formed. A single lumen power injectable portacatheter was then tunneled through the subcutaneous tissues from the pocket to the dermatotomy and the port reservoir placed within the subcutaneous pocket. The venous access site was then serially dilated and a peel away vascular sheath placed over the wire. The wire was removed and the port catheter advanced into position under fluoroscopic guidance. The catheter tip is positioned in the cavoatrial junction. This was  documented with a spot image. The portacatheter was then tested and found to flush and aspirate well. The port was flushed with saline followed by 100 units/mL heparinized saline. The pocket was then closed in two layers using first subdermal inverted interrupted absorbable sutures followed by a running subcuticular suture. The epidermis was then sealed with Dermabond. The dermatotomy at the venous access site was also seal with Dermabond. Patient tolerated the procedure well and remained hemodynamically stable throughout. No complications encountered and no significant blood loss encountered IMPRESSION: Status post right IJ port catheter placement. Signed, Dulcy Fanny. Dellia Nims, RPVI [facility] Electronically Signed   By: Corrie Mckusick D.O.   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 Mass measures 6 x 5.8 cm and is partially calcified. Separate solid component superior to this in the left periaortic region measures 3.5 cm, SUV 14.3. Cystic component medial to the lower pole of the left kidney is without hypermetabolic activity, possibly a lymphocele. -CT-guided biopsy of the right retroperitoneal lymph node consistent  with adenocarcinoma with psammoma bodies. Morphology and immunotherapy consistent with ovarian serous carcinoma/primary peritoneal carcinoma. -Germline mutation testing was negative. -Foundation 1 testing shows MS-stable. Loss of heterozygosity was less than 16%. -She met with Dr. Denman George who recommended neoadjuvant chemotherapywith 3-6 cycles of carboplatin and paclitaxel with interim staging after 3 cycles. If there is substantial response unless encasement of the aorta and vena cava, consideration for debulking procedure can be possible at that  time. -CA-125 346 on 06/23/2019. -Cycle 1 of carboplatin and paclitaxel on 07/28/2019.   PLAN:  1. Ovarian serous carcinoma: -She has felt very well in the last 2 weeks.  She is able to do all her home activities. -I have reviewed her labs today.  She will proceed with cycle 2 today. -I will increase carboplatin to full dose.  I will increase paclitaxel to 150 mg per metered square. She will be seen back in 3 weeks for follow-up.  2. Low back pain: -She is not requiring pain medication.  Continue MSIR 15 mg as needed.  3. Constipation: -Continue Senokot 1 tablet daily.  4. Hypertension: -Continue Norvasc and losartan/HCTZ.  5. Leg swelling: -This has improved.  She is taking Lasix as needed. As she reported pain in the left ankle.  Will order x-ray.   Orders placed this encounter:  Orders Placed This Encounter  Procedures  . DG Ankle 2 Views Right     Derek Jack, MD [place] [phone]   I, Milinda Antis, am acting as a scribe for Dr. Sanda Linger.  I, Derek Jack MD, have reviewed the above documentation for accuracy and completeness, and I agree with the above.

## 2019-08-18 NOTE — Progress Notes (Signed)
Patient presents today for treatment and follow visit with Dr. Delton Coombes. Labs pending. Patient has complaints of loose stools which she took Imodium for. Patient states she took Lactulose for her constipation which then caused diarrhea. Patient teaching performed. Patient has complaints of right ankle pain that is ongoing. See MAR. Blood pressure on arrival elevated.   Labs within parameters for treatment.   Message received from [facility] LPN/ Dr. Delton Coombes to proceed with treatment.   Treatment given today per MD orders. Tolerated infusion without adverse affects. Vital signs stable. No complaints at this time. Discharged from clinic via wheel chair.  F/U with [facility] as scheduled.

## 2019-08-18 NOTE — Patient Instructions (Signed)
[facility] Discharge Instructions for Patients Receiving Chemotherapy  Today you received the following chemotherapy agents   To help prevent nausea and vomiting after your treatment, we encourage you to take your nausea medication   If you develop nausea and vomiting that is not controlled by your nausea medication, call the clinic.   BELOW ARE SYMPTOMS THAT SHOULD BE REPORTED IMMEDIATELY:  *FEVER GREATER THAN 100.5 F  *CHILLS WITH OR WITHOUT FEVER  NAUSEA AND VOMITING THAT IS NOT CONTROLLED WITH YOUR NAUSEA MEDICATION  *UNUSUAL SHORTNESS OF BREATH  *UNUSUAL BRUISING OR BLEEDING  TENDERNESS IN MOUTH AND THROAT WITH OR WITHOUT PRESENCE OF ULCERS  *URINARY PROBLEMS  *BOWEL PROBLEMS  UNUSUAL RASH Items with * indicate a potential emergency and should be followed up as soon as possible.  Feel free to call the clinic should you have any questions or concerns. The clinic phone number is [phone].  Please show the CHEMO ALERT CARD at check-in to the Emergency Department and triage nurse.   

## 2019-08-18 NOTE — Progress Notes (Signed)
Patient has been assessed, vital signs and labs have been reviewed by Dr. Katragadda. ANC, Creatinine, LFTs, and Platelets are within treatment parameters per Dr. Katragadda. The patient is good to proceed with treatment at this time.  

## 2019-08-18 NOTE — Patient Instructions (Signed)
[place] at [facility] Discharge Instructions  You were seen today by Dr. Delton Coombes. He went over your recent results. You received your treatment today. You will be scheduled for an x-ray of your right ankle. Purchase melatonin over the counter and take as needed for sleep. Dr. Delton Coombes will see you back in 3 weeks for labs and follow up.   Thank you for choosing [place] at [facility] to provide your oncology and hematology care.  To afford each patient quality time with our provider, please arrive at least 15 minutes before your scheduled appointment time.   If you have a lab appointment with the [place] please come in thru the Main Entrance and check in at the main information desk  You need to re-schedule your appointment should you arrive 10 or more minutes late.  We strive to give you quality time with our providers, and arriving late affects you and other patients whose appointments are after yours.  Also, if you no show three or more times for appointments you may be dismissed from the clinic at the providers discretion.     Again, thank you for choosing [facility].  Our hope is that these requests will decrease the amount of time that you wait before being seen by our physicians.       _____________________________________________________________  Should you have questions after your visit to [facility], please contact our office at [phone] between the hours of 8:00 a.m. and 4:30 p.m.  Voicemails left after 4:00 p.m. will not be returned until the following business day.  For prescription refill requests, have your pharmacy contact our office and allow 72 hours.    Cancer Center Support Programs:   > Cancer Support Group  2nd Tuesday of the month 1pm-2pm, Journey Room

## 2019-08-26 ENCOUNTER — Encounter (HOSPITAL_COMMUNITY): Payer: Self-pay

## 2019-08-26 DIAGNOSIS — R351 Nocturia: Secondary | ICD-10-CM | POA: Insufficient documentation

## 2019-09-05 ENCOUNTER — Telehealth: Payer: Self-pay | Admitting: Internal Medicine

## 2019-09-05 NOTE — Telephone Encounter (Signed)
3 weeks ago, started with head cold, now going into throat, coughing clear phlegm, very little sleep, poor appetite,  Can't sleep, restless  Scheduled for chem on 8.26.21  Would like someone to call in a prescription,  No fever, cold chills

## 2019-09-07 ENCOUNTER — Encounter (HOSPITAL_COMMUNITY): Payer: Self-pay

## 2019-09-07 NOTE — Progress Notes (Signed)
Attempted to return patient's call. Unable to reach patient at this time and unable to leave a VM.

## 2019-09-08 ENCOUNTER — Other Ambulatory Visit (HOSPITAL_COMMUNITY): Admission: RE | Admit: 2019-09-08 | Payer: Medicare Other | Source: Ambulatory Visit

## 2019-09-08 ENCOUNTER — Encounter (HOSPITAL_COMMUNITY): Payer: Self-pay

## 2019-09-08 ENCOUNTER — Inpatient Hospital Stay (HOSPITAL_BASED_OUTPATIENT_CLINIC_OR_DEPARTMENT_OTHER): Payer: Medicare Other | Admitting: Hematology

## 2019-09-08 ENCOUNTER — Other Ambulatory Visit (HOSPITAL_COMMUNITY)
Admission: RE | Admit: 2019-09-08 | Discharge: 2019-09-08 | Disposition: A | Discharge status: Home / Self Care | Payer: Medicare Other | Source: Ambulatory Visit | Attending: Hematology | Admitting: Hematology

## 2019-09-08 ENCOUNTER — Inpatient Hospital Stay (HOSPITAL_COMMUNITY): Payer: Medicare Other

## 2019-09-08 ENCOUNTER — Ambulatory Visit (HOSPITAL_COMMUNITY)
Admission: RE | Admit: 2019-09-08 | Discharge: 2019-09-08 | Disposition: A | Discharge status: Home / Self Care | Payer: Medicare Other | Source: Ambulatory Visit | Attending: Hematology | Admitting: Hematology

## 2019-09-08 ENCOUNTER — Other Ambulatory Visit: Payer: Self-pay

## 2019-09-08 VITALS — BP 155/51 | HR 94 | Temp 97.0°F | Resp 18 | Wt 188.4 lb

## 2019-09-08 DIAGNOSIS — I10 Essential (primary) hypertension: Secondary | ICD-10-CM | POA: Diagnosis not present

## 2019-09-08 DIAGNOSIS — R05 Cough: Secondary | ICD-10-CM | POA: Diagnosis not present

## 2019-09-08 DIAGNOSIS — K5900 Constipation, unspecified: Secondary | ICD-10-CM | POA: Diagnosis not present

## 2019-09-08 DIAGNOSIS — Z20822 Contact with and (suspected) exposure to covid-19: Secondary | ICD-10-CM | POA: Insufficient documentation

## 2019-09-08 DIAGNOSIS — Z5111 Encounter for antineoplastic chemotherapy: Secondary | ICD-10-CM | POA: Diagnosis not present

## 2019-09-08 DIAGNOSIS — Z9989 Dependence on other enabling machines and devices: Secondary | ICD-10-CM | POA: Diagnosis not present

## 2019-09-08 DIAGNOSIS — C569 Malignant neoplasm of unspecified ovary: Secondary | ICD-10-CM

## 2019-09-08 DIAGNOSIS — E119 Type 2 diabetes mellitus without complications: Secondary | ICD-10-CM | POA: Diagnosis not present

## 2019-09-08 DIAGNOSIS — M545 Low back pain: Secondary | ICD-10-CM | POA: Diagnosis not present

## 2019-09-08 DIAGNOSIS — G4700 Insomnia, unspecified: Secondary | ICD-10-CM | POA: Diagnosis not present

## 2019-09-08 DIAGNOSIS — R059 Cough, unspecified: Secondary | ICD-10-CM

## 2019-09-08 DIAGNOSIS — E785 Hyperlipidemia, unspecified: Secondary | ICD-10-CM | POA: Diagnosis not present

## 2019-09-08 DIAGNOSIS — Z5189 Encounter for other specified aftercare: Secondary | ICD-10-CM | POA: Diagnosis not present

## 2019-09-08 DIAGNOSIS — G4730 Sleep apnea, unspecified: Secondary | ICD-10-CM | POA: Diagnosis not present

## 2019-09-08 DIAGNOSIS — Z01812 Encounter for preprocedural laboratory examination: Secondary | ICD-10-CM | POA: Insufficient documentation

## 2019-09-08 DIAGNOSIS — Z79899 Other long term (current) drug therapy: Secondary | ICD-10-CM | POA: Diagnosis not present

## 2019-09-08 DIAGNOSIS — R600 Localized edema: Secondary | ICD-10-CM | POA: Diagnosis not present

## 2019-09-08 DIAGNOSIS — M1990 Unspecified osteoarthritis, unspecified site: Secondary | ICD-10-CM | POA: Diagnosis not present

## 2019-09-08 DIAGNOSIS — Z95828 Presence of other vascular implants and grafts: Secondary | ICD-10-CM

## 2019-09-08 LAB — URINALYSIS, ROUTINE W REFLEX MICROSCOPIC
Bacteria, UA: NONE SEEN
Bilirubin Urine: NEGATIVE
Glucose, UA: NEGATIVE mg/dL
Hgb urine dipstick: NEGATIVE
Ketones, ur: NEGATIVE mg/dL
Leukocytes,Ua: NEGATIVE
Nitrite: POSITIVE — AB
Protein, ur: NEGATIVE mg/dL
Specific Gravity, Urine: 1.008 (ref 1.005–1.030)
pH: 5 (ref 5.0–8.0)

## 2019-09-08 LAB — CBC WITH DIFFERENTIAL/PLATELET
Abs Immature Granulocytes: 0.02 10*3/uL (ref 0.00–0.07)
Basophils Absolute: 0 10*3/uL (ref 0.0–0.1)
Basophils Relative: 1 %
Eosinophils Absolute: 0.1 10*3/uL (ref 0.0–0.5)
Eosinophils Relative: 2 %
HCT: 32.4 % — ABNORMAL LOW (ref 36.0–46.0)
Hemoglobin: 10.6 g/dL — ABNORMAL LOW (ref 12.0–15.0)
Immature Granulocytes: 0 %
Lymphocytes Relative: 32 %
Lymphs Abs: 2 10*3/uL (ref 0.7–4.0)
MCH: 28.9 pg (ref 26.0–34.0)
MCHC: 32.7 g/dL (ref 30.0–36.0)
MCV: 88.3 fL (ref 80.0–100.0)
Monocytes Absolute: 0.7 10*3/uL (ref 0.1–1.0)
Monocytes Relative: 11 %
Neutro Abs: 3.3 10*3/uL (ref 1.7–7.7)
Neutrophils Relative %: 54 %
Platelets: 204 10*3/uL (ref 150–400)
RBC: 3.67 MIL/uL — ABNORMAL LOW (ref 3.87–5.11)
RDW: 15.1 % (ref 11.5–15.5)
WBC: 6 10*3/uL (ref 4.0–10.5)
nRBC: 0 % (ref 0.0–0.2)

## 2019-09-08 LAB — COMPREHENSIVE METABOLIC PANEL
ALT: 16 U/L (ref 0–44)
AST: 19 U/L (ref 15–41)
Albumin: 4.3 g/dL (ref 3.5–5.0)
Alkaline Phosphatase: 59 U/L (ref 38–126)
Anion gap: 10 (ref 5–15)
BUN: 18 mg/dL (ref 8–23)
CO2: 21 mmol/L — ABNORMAL LOW (ref 22–32)
Calcium: 9.5 mg/dL (ref 8.9–10.3)
Chloride: 99 mmol/L (ref 98–111)
Creatinine, Ser: 0.94 mg/dL (ref 0.44–1.00)
GFR calc Af Amer: >60 mL/min (ref >60)
GFR calc non Af Amer: 60 mL/min — ABNORMAL LOW (ref >60)
Glucose, Bld: 192 mg/dL — ABNORMAL HIGH (ref 70–99)
Potassium: 3.7 mmol/L (ref 3.5–5.1)
Sodium: 130 mmol/L — ABNORMAL LOW (ref 135–145)
Total Bilirubin: 0.5 mg/dL (ref 0.3–1.2)
Total Protein: 6.9 g/dL (ref 6.5–8.1)

## 2019-09-08 MED ORDER — SODIUM CHLORIDE 0.9% FLUSH
10.0000 mL | Freq: Once | INTRAVENOUS | Status: AC
  Administered 2019-09-08: 10 mL via INTRAVENOUS

## 2019-09-08 MED ORDER — CIPROFLOXACIN HCL 500 MG PO TABS: 500.0000 mg | ORAL_TABLET | Freq: Two times a day (BID) | ORAL | 0 refills | Status: DC

## 2019-09-08 MED ORDER — HEPARIN SOD (PORK) LOCK FLUSH 100 UNIT/ML IV SOLN
500.0000 [IU] | Freq: Once | INTRAVENOUS | Status: AC
  Administered 2019-09-08: 500 [IU] via INTRAVENOUS

## 2019-09-08 NOTE — Patient Instructions (Signed)
[facility] at [facility]  Discharge Instructions:   _______________________________________________________________  Thank you for choosing [facility] at [facility] to provide your oncology and hematology care.  To afford each patient quality time with our providers, please arrive at least 15 minutes before your scheduled appointment.  You need to re-schedule your appointment if you arrive 10 or more minutes late.  We strive to give you quality time with our providers, and arriving late affects you and other patients whose appointments are after yours.  Also, if you no show three or more times for appointments you may be dismissed from the clinic.  Again, thank you for choosing [facility] at [facility]. Our hope is that these requests will allow you access to exceptional care and in a timely manner. _______________________________________________________________  If you have questions after your visit, please contact our office at [phone] between the hours of 8:30 a.m. and 5:00 p.m. Voicemails left after 4:30 p.m. will not be returned until the following business day. _______________________________________________________________  For prescription refill requests, have your pharmacy contact our office. _______________________________________________________________  Recommendations made by the consultant and any test results will be sent to your referring physician. _______________________________________________________________ 

## 2019-09-08 NOTE — Progress Notes (Signed)
Dr. Delton Coombes has assessed patient and reviewed labs.  Patient currently not feeling well.  Orders received for chest xray 2 view and urinalysis.  We will postpone treatment by one week and assess patient at that time.

## 2019-09-08 NOTE — Progress Notes (Signed)
Pt came in for COVID-19 test today.  I  rescheduled her appointment. I called Dr. Marthann Schiller office upstairs and lmom for the schedulers. They called me back and said Dr.K wants her to have the test today. I called patient back and apologized and apologized. Patient was very gracious and is coming back for her Covid test today. I'm going to cancel the appt. I made for 09/12/2019 and rsc it for today. Nothing further needed.

## 2019-09-08 NOTE — Progress Notes (Signed)
Laura [name], [place] [address]   CLINIC:  Medical Oncology/Hematology  PCP:  Biagio Borg, MD [place] / [place] [address] [phone]   REASON FOR VISIT:  Follow-up for ovarian cancer  PRIOR THERAPY: None  NGS Results: Foundation 1 MS--stable  CURRENT THERAPY: Carboplatin and paclitaxel every 3 weeks  BRIEF ONCOLOGIC HISTORY:  Oncology History  Primary ovarian adenocarcinoma, unspecified laterality ([place])  06/20/2019 Initial Diagnosis   Primary ovarian adenocarcinoma, unspecified laterality ([place])   07/01/2019 Genetic Testing   Foundation One     07/11/2019 Genetic Testing   Negative genetic testing:  No pathogenic variants detected on the [facility] Multi-Cancer Panel. The report date is 07/11/2019.  The Multi-Cancer Panel offered by [facility] includes sequencing and/or deletion duplication testing of the following 85 genes: AIP, ALK, APC, ATM, AXIN2,BAP1,  BARD1, BLM, BMPR1A, BRCA1, BRCA2, BRIP1, CASR, CDC73, CDH1, CDK4, CDKN1B, CDKN1C, CDKN2A (p14ARF), CDKN2A (p16INK4a), CEBPA, CHEK2, CTNNA1, DICER1, DIS3L2, EGFR (c.2369C>T, p.Thr790Met variant only), EPCAM (Deletion/duplication testing only), FH, FLCN, GATA2, GPC3, GREM1 (Promoter region deletion/duplication testing only), HOXB13 (c.251G>A, p.Gly84Glu), HRAS, KIT, MAX, MEN1, MET, MITF (c.952G>A, p.Glu318Lys variant only), MLH1, MSH2, MSH3, MSH6, MUTYH, NBN, NF1, NF2, NTHL1, PALB2, PDGFRA, PHOX2B, PMS2, POLD1, POLE, POT1, PRKAR1A, PTCH1, PTEN, RAD50, RAD51C, RAD51D, RB1, RECQL4, RET, RNF43, RUNX1, SDHAF2, SDHA (sequence changes only), SDHB, SDHC, SDHD, SMAD4, SMARCA4, SMARCB1, SMARCE1, STK11, SUFU, TERC, TERT, TMEM127, TP53, TSC1, TSC2, VHL, WRN and WT1.   07/28/2019 -  Chemotherapy   The patient had palonosetron (ALOXI) injection 0.25 mg, 0.25 mg, Intravenous,  Once, 2 of 6 cycles Administration: 0.25 mg (07/28/2019), 0.25 mg (08/18/2019) pegfilgrastim (NEULASTA ONPRO KIT) injection 6  mg, 6 mg, Subcutaneous, Once, 2 of 6 cycles Administration: 6 mg (07/28/2019), 6 mg (08/18/2019) CARBOplatin (PARAPLATIN) 470 mg in sodium chloride 0.9 % 250 mL chemo infusion, 470 mg (100 % of original dose 470 mg), Intravenous,  Once, 2 of 6 cycles Dose modification:   (original dose 470 mg, Cycle 1, Reason: Provider Judgment),   (original dose 568.2 mg, Cycle 2) Administration: 470 mg (07/28/2019), 570 mg (08/18/2019) fosaprepitant (EMEND) 150 mg in sodium chloride 0.9 % 145 mL IVPB, 150 mg, Intravenous,  Once, 2 of 6 cycles Administration: 150 mg (07/28/2019), 150 mg (08/18/2019) PACLitaxel (TAXOL) 282 mg in sodium chloride 0.9 % 250 mL chemo infusion (> 32m/m2), 140 mg/m2 = 282 mg (80 % of original dose 175 mg/m2), Intravenous,  Once, 2 of 6 cycles Dose modification: 140 mg/m2 (80 % of original dose 175 mg/m2, Cycle 1, Reason: Provider Judgment), 150 mg/m2 (original dose 175 mg/m2, Cycle 2, Reason: Provider Judgment) Administration: 282 mg (07/28/2019), 300 mg (08/18/2019)  for chemotherapy treatment.      CANCER STAGING: Cancer Staging No matching staging information was found for the patient.  INTERVAL HISTORY:  Laura [name] a 74y.o. female, returns for routine follow-up and consideration for next cycle of chemotherapy. MLenol[place]as last seen on 08/18/2019.  Due for cycle #3 of carboplatin and paclitaxel today.   Today she reports feeling miserable since having a cold for the past 2.5 weeks, when she feels cold especially at night, restless when she sleeps due to Sudafed, runny throat, and productive coughing with clear sputum. She tried Mucinex and Delsym for her runny nose and cough, respectively. She denies having a fever or sore throat. Her last episode of bronchitis was several years ago. She complains of worsening vision, requiring glasses to read, and bladder  pain when she reclines at night, but denies pain with urination. She tried AZO which did not help and had to take morphine for the  pelvic pain. She tolerated the previous chemo treatment well though a little fatigue, but denied having N/V, numbness or tingling after her last treatment.  She did not get her COVID vaccine.   REVIEW OF SYSTEMS:  Review of Systems  Constitutional: Positive for appetite change (moderately decreased), chills and fatigue (moderate). Negative for fever.  HENT:   Positive for hearing loss. Negative for sore throat.   Eyes: Positive for eye problems.  Respiratory: Positive for cough.   Gastrointestinal: Negative for nausea and vomiting.  Genitourinary: Positive for difficulty urinating, frequency and pelvic pain (8/10 bladder pain). Negative for dysuria.   Neurological: Positive for dizziness. Negative for numbness.  Psychiatric/Behavioral: Positive for sleep disturbance. The patient is nervous/anxious.   All other systems reviewed and are negative.   PAST MEDICAL/SURGICAL HISTORY:  Past Medical History:  Diagnosis Date  . Anxiety   . Arthritis    back of neck, bones spurs on neck  . Cataract   . Cervical disc disease   . Diabetes mellitus   . Family history of thyroid cancer   . Hyperlipidemia   . Hypertension   . Mucoid cyst of joint    right thumb  . Neuropathy   . Port-A-Cath in place 07/21/2019  . Reflux   . Sleep apnea    wears CPAP nightly  . Vertigo    Past Surgical History:  Procedure Laterality Date  . BLADDER SURGERY    . BREAST EXCISIONAL BIOPSY Bilateral   . BREAST SURGERY    . CHOLECYSTECTOMY    . fibroids removed     breast (both breasts)  . IR IMAGING GUIDED PORT INSERTION  07/26/2019  . MASS EXCISION Right 06/26/2016   Procedure: EXCISION MUCOID TUMOR RIGHT THUMB, IP RIGHT THUMB;  Surgeon: Daryll Brod, MD;  Location: [place];  Service: Orthopedics;  Laterality: Right;  . PARTIAL HYSTERECTOMY      SOCIAL HISTORY:  Social History   Socioeconomic History  . Marital status: Divorced    Spouse name: Not on file  . Number of children: 4    . Years of education: 66  . Highest education level: Not on file  Occupational History  . Occupation: retired Geographical information systems officer  Tobacco Use  . Smoking status: Never Smoker  . Smokeless tobacco: Never Used  Vaping Use  . Vaping Use: Never used  Substance and Sexual Activity  . Alcohol use: No    Alcohol/week: 0.0 standard drinks  . Drug use: No  . Sexual activity: Not Currently  Other Topics Concern  . Not on file  Social History Narrative  . Not on file   Social Determinants of Health   Financial Resource Strain: Low Risk   . Difficulty of Paying Living Expenses: Not hard at all  Food Insecurity: No Food Insecurity  . Worried About Charity fundraiser in the Last Year: Never true  . Ran Out of Food in the Last Year: Never true  Transportation Needs: No Transportation Needs  . Lack of Transportation (Medical): No  . Lack of Transportation (Non-Medical): No  Physical Activity: Inactive  . Days of Exercise per Week: 0 days  . Minutes of Exercise per Session: 0 min  Stress: No Stress Concern Present  . Feeling of Stress : Not at all  Social Connections: Moderately Integrated  . Frequency of  Communication with Friends and Family: More than three times a week  . Frequency of Social Gatherings with Friends and Family: More than three times a week  . Attends Religious Services: More than 4 times per year  . Active Member of Clubs or Organizations: Yes  . Attends Archivist Meetings: Never  . Marital Status: Divorced  Human resources officer Violence: Not At Risk  . Fear of Current or Ex-Partner: No  . Emotionally Abused: No  . Physically Abused: No  . Sexually Abused: No    FAMILY HISTORY:  Family History  Problem Relation Age of Onset  . Hypertension Other   . Diabetes Father   . Hypertension Father   . Diabetes Sister   . Hypertension Mother   . Hypertension Sister   . Stroke Sister   . Diabetes Maternal Aunt   . Thyroid cancer Daughter 19  . Colon  cancer Neg Hx   . Esophageal cancer Neg Hx   . Stomach cancer Neg Hx   . Rectal cancer Neg Hx   . Breast cancer Neg Hx   . Endometrial cancer Neg Hx   . Ovarian cancer Neg Hx     CURRENT MEDICATIONS:  Current Outpatient Medications  Medication Sig Dispense Refill  . acetaminophen (TYLENOL) 500 MG tablet Take 500 mg by mouth 2 (two) times daily as needed for moderate pain or headache.     Marland Kitchen amLODipine (NORVASC) 5 MG tablet TAKE ONE (1) TABLET BY MOUTH EVERY DAY (Patient taking differently: Take 5 mg by mouth daily. ) 90 tablet 1  . Blood Glucose Monitoring Suppl (ONE TOUCH ULTRA 2) w/Device KIT Use as directed 1 each 0  . CARBOPLATIN IV Inject into the vein every 21 ( twenty-one) days.    . cetirizine (ZYRTEC) 10 MG tablet Take 10 mg by mouth daily.    . cholecalciferol (VITAMIN D3) 25 MCG (1000 UNIT) tablet Take 2,000 Units by mouth daily.    . furosemide (LASIX) 20 MG tablet Take 1 tablet (20 mg total) by mouth daily. 30 tablet 2  . glimepiride (AMARYL) 2 MG tablet TAKE ONE TABLET BY MOUTH DAILY 90 tablet 3  . Lancets (ONETOUCH ULTRASOFT) lancets USE TO CHECK BLOOD SUGAR TWICE DAILY 100 each 3  . lidocaine-prilocaine (EMLA) cream Apply a small amount to port a cath site and cover with plastic wrap 1 hour prior to chemotherapy appointments 30 g 3  . loperamide (IMODIUM) 2 MG capsule Take by mouth as needed for diarrhea or loose stools.     Marland Kitchen losartan-hydrochlorothiazide (HYZAAR) 100-12.5 MG tablet Take 1 tablet by mouth daily. 90 tablet 3  . morphine (MSIR) 15 MG tablet Take 1 tablet (15 mg total) by mouth 2 (two) times daily as needed for severe pain. 30 tablet 0  . ONETOUCH ULTRA test strip USE TO CHECK BLOOD SUGARS TWO TIMES DAILY 100 strip 3  . OVER THE COUNTER MEDICATION Apply 1 application topically daily as needed (pain). Triderma otc pain cream     . PACLITAXEL IV Inject 175 mg/m2 into the vein every 21 ( twenty-one) days.    . Pegfilgrastim (NEULASTA ONPRO ) Inject into the  skin every 21 ( twenty-one) days.    . phenylephrine (SUDAFED PE) 10 MG TABS tablet     . Phenylephrine-APAP-guaiFENesin (MUCINEX SINUS-MAX) 10-650-400 MG/20ML LIQD     . Phenylephrine-DM-GG (TUSSIN CF MAX MULTI-SYMPTOM) 5-10-200 MG/5ML LIQD     . prochlorperazine (COMPAZINE) 10 MG tablet Take 1 tablet (10 mg total) by  mouth every 6 (six) hours as needed (Nausea or vomiting). 30 tablet 1  . sennosides-docusate sodium (SENOKOT-S) 8.6-50 MG tablet Take 2 tablets by mouth daily.    . vitamin B-12 (CYANOCOBALAMIN) 1000 MCG tablet Take 2,000 mcg by mouth daily.    . Lactulose 20 GM/30ML SOLN Take 30 mLs (20 g total) by mouth at bedtime. (Patient not taking: Reported on 07/20/2019) 480 mL 3  . pantoprazole (PROTONIX) 40 MG tablet TAKE 1 TABLET (40 MG TOTAL) BY MOUTH DAILY. (Patient not taking: Reported on 08/04/2019) 90 tablet 1   No current facility-administered medications for this visit.    ALLERGIES:  Allergies  Allergen Reactions  . Hydrocodone-Homatropine Other (See Comments)    Vertigo *pt strongly prefers to never take*  . Sulfa Antibiotics Hives, Itching and Swelling    Tongue swells  . Augmentin [Amoxicillin-Pot Clavulanate]     Diarrhea; can take PCN/ Amoxicillin  . Codeine Itching  . Crestor [Rosuvastatin Calcium] Other (See Comments)    Did something to memory    . Doxycycline Other (See Comments)    Severe rectal Gas.  . Gabapentin     disoriented  . Keflex [Cephalexin] Diarrhea and Nausea And Vomiting  . Naproxen Other (See Comments)    Stomach cramps  . Statins     Muscle weakness  . Prednisone Anxiety    *pt strongly prefers to never be given prednisone*     PHYSICAL EXAM:  Performance status (ECOG): 1 - Symptomatic but completely ambulatory  Vitals:   09/08/19 0824  BP: (!) 155/51  Pulse: 94  Resp: 18  Temp: (!) 97 F (36.1 C)  SpO2: 99%   Wt Readings from Last 3 Encounters:  09/08/19 188 lb 6.4 oz (85.5 kg)  08/18/19 196 lb 9.6 oz (89.2 kg)  08/04/19  192 lb (87.1 kg)   Physical Exam Vitals reviewed.  Constitutional:      Appearance: Normal appearance. She is obese.  Cardiovascular:     Rate and Rhythm: Normal rate and regular rhythm.     Pulses: Normal pulses.     Heart sounds: Normal heart sounds.  Pulmonary:     Effort: Pulmonary effort is normal.     Breath sounds: Normal breath sounds.  Chest:     Comments: Port-a-Cath in R chest Neurological:     General: No focal deficit present.     Mental Status: She is alert and oriented to person, place, and time.  Psychiatric:        Mood and Affect: Mood normal.        Behavior: Behavior normal.     LABORATORY DATA:  I have reviewed the labs as listed.  CBC Latest Ref Rng & Units 09/08/2019 08/18/2019 08/04/2019  WBC 4.0 - 10.5 K/uL 6.0 7.7 22.3(H)  Hemoglobin 12.0 - 15.0 g/dL 10.6(L) 10.8(L) 11.4(L)  Hematocrit 36 - 46 % 32.4(L) 32.4(L) 34.8(L)  Platelets 150 - 400 K/uL 204 210 208   CMP Latest Ref Rng & Units 09/08/2019 08/18/2019 08/04/2019  Glucose 70 - 99 mg/dL 192(H) 177(H) 132(H)  BUN 8 - 23 mg/dL _0 Creatinine 0.44 - 1.00 mg/dL 0.94 0.82 1.13(H)  Sodium 135 - 145 mmol/L 130(L) 135 133(L)  Potassium 3.5 - 5.1 mmol/L 3.7 3.8 3.6  Chloride 98 - 111 mmol/L 99 102 97(L)  CO2 22 - 32 mmol/L 21(L) 23 24  Calcium 8.9 - 10.3 mg/dL 9.5 9.3 9.6  Total Protein 6.5 - 8.1 g/dL 6.9 6.8 7.2  Total Bilirubin  0.3 - 1.2 mg/dL 0.5 0.4 0.4  Alkaline Phos 38 - 126 U/L 59 61 96  AST 15 - 41 U/L _0 ALT 0 - 44 U/L _1 DIAGNOSTIC IMAGING:  I have independently reviewed the scans and discussed with the patient. No results found.   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 Mass measures 6 x 5.8 cm and is partially calcified. Separate solid component superior to this in the left periaortic region measures 3.5 cm, SUV 14.3.  Cystic component medial to the lower pole of the left kidney is without hypermetabolic activity, possibly a lymphocele. -CT-guided biopsy of the right retroperitoneal lymph node consistent with adenocarcinoma with psammoma bodies. Morphology and immunotherapy consistent with ovarian serous carcinoma/primary peritoneal carcinoma. -Germline mutation testing was negative. -Foundation 1 testing shows MS-stable. Loss of heterozygosity was less than 16%. -She met with Dr. Denman George who recommended neoadjuvant chemotherapywith 3-6 cycles of carboplatin and paclitaxel with interim staging after 3 cycles. If there is substantial response unless encasement of the aorta and vena cava, consideration for debulking procedure can be possible at that time. -CA-125 346 on 06/23/2019. -Cycle 1 of carboplatin and paclitaxel on 07/28/2019.   PLAN:  1. Ovarian serous carcinoma: -Cycle 2 of chemotherapy was on 08/18/2019. -She developed runny nose and cough over the last 2 weeks.  Could not tolerate Sudafed.  She took Delsym for the last couple of days and cough improved. -We will hold her chemotherapy today. -Checked chest x-ray which showed possible infiltrates in bilateral lower lobes. -Recommended COVID-19 testing.  She is not vaccinated. -She complained of pain in her bladder since yesterday.  No dysuria. -We have done a UA which showed nitrate positive but negative for leukocytes.  As she is immunocompromised I will proceed with treating this as infection. -We will reevaluate her in 1 week.  2. Low back pain: -Continue MSIR 15 mg as needed.  3. Constipation: -Continue Senokot 1 tablet daily.  4. Hypertension: -Continue Norvasc and losartan/HCTZ.  5. Leg swelling: -Continue Lasix as needed.   Orders placed this encounter:  Orders Placed This Encounter  Procedures  . Urinalysis, Routine w reflex microscopic     Derek Jack, MD [place] [phone]   I, Milinda Antis, am acting as a scribe for Dr. Sanda Linger.  I, Derek Jack MD, have reviewed the above documentation for accuracy and completeness, and I agree with the above.

## 2019-09-08 NOTE — Progress Notes (Signed)
Treatment held today.  Urine obtain.  Chest x-ray.  Covid testing.  Hold treatment x1 week. Will call with results of urine and chest x-ray.

## 2019-09-08 NOTE — Patient Instructions (Signed)
[place] at [facility] Discharge Instructions  You were seen today by Dr. Delton Coombes. He went over your recent results. You did not receive your treatment today. You had a urinalysis and chest x-ray done today. Dr. Delton Coombes will see you back in 1 week for labs and follow up.   Thank you for choosing [place] at [facility] to provide your oncology and hematology care.  To afford each patient quality time with our provider, please arrive at least 15 minutes before your scheduled appointment time.   If you have a lab appointment with the [place] please come in thru the Main Entrance and check in at the main information desk  You need to re-schedule your appointment should you arrive 10 or more minutes late.  We strive to give you quality time with our providers, and arriving late affects you and other patients whose appointments are after yours.  Also, if you no show three or more times for appointments you may be dismissed from the clinic at the providers discretion.     Again, thank you for choosing [facility].  Our hope is that these requests will decrease the amount of time that you wait before being seen by our physicians.       _____________________________________________________________  Should you have questions after your visit to [facility], please contact our office at [phone] between the hours of 8:00 a.m. and 4:30 p.m.  Voicemails left after 4:00 p.m. will not be returned until the following business day.  For prescription refill requests, have your pharmacy contact our office and allow 72 hours.    Cancer Center Support Programs:   > Cancer Support Group  2nd Tuesday of the month 1pm-2pm, Journey Room

## 2019-09-08 NOTE — Progress Notes (Signed)
Patient aware of Cipro prescription ordered by Dr. Delton Coombes. Patient verbalized understanding of medication regimen. I encouraged the patient to call with questions or concerns.

## 2019-09-09 ENCOUNTER — Encounter (HOSPITAL_COMMUNITY): Payer: Medicare Other

## 2019-09-09 ENCOUNTER — Encounter (HOSPITAL_COMMUNITY): Payer: Self-pay

## 2019-09-09 LAB — SARS CORONAVIRUS 2 (TAT 6-24 HRS): SARS Coronavirus 2: NEGATIVE

## 2019-09-09 NOTE — Telephone Encounter (Signed)
Sent in to Dr. Jenny Reichmann to advise.

## 2019-09-09 NOTE — Progress Notes (Signed)
Nutrition  Patient scheduled for nutrition follow-up via phone today.  RD called patient several times today and have not been able to reach patient.  Voicemail has not been set up.    [place] Deleo B. Zenia Resides, [place], [place] Registered Dietitian [phone] (mobile)

## 2019-09-09 NOTE — Telephone Encounter (Signed)
Pt needs [facility] for covid testing as this could be viral or even allergies, and much less likely bacterial without significant worsening pain, fever or colored discharge

## 2019-09-12 ENCOUNTER — Other Ambulatory Visit (HOSPITAL_COMMUNITY): Payer: Medicare Other

## 2019-09-12 ENCOUNTER — Other Ambulatory Visit (HOSPITAL_COMMUNITY)
Admission: RE | Admit: 2019-09-12 | Discharge: 2019-09-12 | Disposition: A | Discharge status: Home / Self Care | Payer: Medicare Other | Source: Ambulatory Visit | Attending: Hematology | Admitting: Hematology

## 2019-09-14 NOTE — Telephone Encounter (Signed)
LVM of Dr. Gwynn Burly advice.

## 2019-09-15 ENCOUNTER — Inpatient Hospital Stay (HOSPITAL_BASED_OUTPATIENT_CLINIC_OR_DEPARTMENT_OTHER): Payer: Medicare Other | Admitting: Hematology

## 2019-09-15 ENCOUNTER — Other Ambulatory Visit: Payer: Self-pay

## 2019-09-15 ENCOUNTER — Other Ambulatory Visit (HOSPITAL_COMMUNITY): Payer: Self-pay | Admitting: *Deleted

## 2019-09-15 ENCOUNTER — Inpatient Hospital Stay (HOSPITAL_COMMUNITY): Payer: Medicare Other | Attending: Hematology

## 2019-09-15 ENCOUNTER — Inpatient Hospital Stay (HOSPITAL_COMMUNITY): Payer: Medicare Other

## 2019-09-15 VITALS — BP 142/68 | HR 84 | Temp 97.0°F | Resp 17 | Wt 188.0 lb

## 2019-09-15 DIAGNOSIS — G4730 Sleep apnea, unspecified: Secondary | ICD-10-CM | POA: Insufficient documentation

## 2019-09-15 DIAGNOSIS — F419 Anxiety disorder, unspecified: Secondary | ICD-10-CM | POA: Diagnosis not present

## 2019-09-15 DIAGNOSIS — C569 Malignant neoplasm of unspecified ovary: Secondary | ICD-10-CM

## 2019-09-15 DIAGNOSIS — M545 Low back pain: Secondary | ICD-10-CM | POA: Insufficient documentation

## 2019-09-15 DIAGNOSIS — E119 Type 2 diabetes mellitus without complications: Secondary | ICD-10-CM | POA: Insufficient documentation

## 2019-09-15 DIAGNOSIS — Z79899 Other long term (current) drug therapy: Secondary | ICD-10-CM | POA: Insufficient documentation

## 2019-09-15 DIAGNOSIS — M1990 Unspecified osteoarthritis, unspecified site: Secondary | ICD-10-CM | POA: Insufficient documentation

## 2019-09-15 DIAGNOSIS — R5383 Other fatigue: Secondary | ICD-10-CM | POA: Diagnosis not present

## 2019-09-15 DIAGNOSIS — Z9989 Dependence on other enabling machines and devices: Secondary | ICD-10-CM | POA: Insufficient documentation

## 2019-09-15 DIAGNOSIS — Z5189 Encounter for other specified aftercare: Secondary | ICD-10-CM | POA: Insufficient documentation

## 2019-09-15 DIAGNOSIS — Z95828 Presence of other vascular implants and grafts: Secondary | ICD-10-CM

## 2019-09-15 DIAGNOSIS — R600 Localized edema: Secondary | ICD-10-CM | POA: Insufficient documentation

## 2019-09-15 DIAGNOSIS — K219 Gastro-esophageal reflux disease without esophagitis: Secondary | ICD-10-CM | POA: Insufficient documentation

## 2019-09-15 DIAGNOSIS — I10 Essential (primary) hypertension: Secondary | ICD-10-CM | POA: Diagnosis not present

## 2019-09-15 DIAGNOSIS — R531 Weakness: Secondary | ICD-10-CM | POA: Diagnosis not present

## 2019-09-15 DIAGNOSIS — E785 Hyperlipidemia, unspecified: Secondary | ICD-10-CM | POA: Insufficient documentation

## 2019-09-15 DIAGNOSIS — Z5111 Encounter for antineoplastic chemotherapy: Secondary | ICD-10-CM | POA: Insufficient documentation

## 2019-09-15 DIAGNOSIS — N3289 Other specified disorders of bladder: Secondary | ICD-10-CM | POA: Diagnosis not present

## 2019-09-15 LAB — COMPREHENSIVE METABOLIC PANEL
ALT: 14 U/L (ref 0–44)
AST: 18 U/L (ref 15–41)
Albumin: 4.1 g/dL (ref 3.5–5.0)
Alkaline Phosphatase: 58 U/L (ref 38–126)
Anion gap: 10 (ref 5–15)
BUN: 17 mg/dL (ref 8–23)
CO2: 22 mmol/L (ref 22–32)
Calcium: 9.5 mg/dL (ref 8.9–10.3)
Chloride: 99 mmol/L (ref 98–111)
Creatinine, Ser: 1.04 mg/dL — ABNORMAL HIGH (ref 0.44–1.00)
GFR calc Af Amer: >60 mL/min (ref >60)
GFR calc non Af Amer: 53 mL/min — ABNORMAL LOW (ref >60)
Glucose, Bld: 168 mg/dL — ABNORMAL HIGH (ref 70–99)
Potassium: 3.6 mmol/L (ref 3.5–5.1)
Sodium: 131 mmol/L — ABNORMAL LOW (ref 135–145)
Total Bilirubin: 0.7 mg/dL (ref 0.3–1.2)
Total Protein: 7.2 g/dL (ref 6.5–8.1)

## 2019-09-15 LAB — CBC WITH DIFFERENTIAL/PLATELET
Abs Immature Granulocytes: 0.01 10*3/uL (ref 0.00–0.07)
Basophils Absolute: 0 10*3/uL (ref 0.0–0.1)
Basophils Relative: 1 %
Eosinophils Absolute: 0.2 10*3/uL (ref 0.0–0.5)
Eosinophils Relative: 3 %
HCT: 31.9 % — ABNORMAL LOW (ref 36.0–46.0)
Hemoglobin: 10.7 g/dL — ABNORMAL LOW (ref 12.0–15.0)
Immature Granulocytes: 0 %
Lymphocytes Relative: 35 %
Lymphs Abs: 1.9 10*3/uL (ref 0.7–4.0)
MCH: 29.6 pg (ref 26.0–34.0)
MCHC: 33.5 g/dL (ref 30.0–36.0)
MCV: 88.4 fL (ref 80.0–100.0)
Monocytes Absolute: 0.5 10*3/uL (ref 0.1–1.0)
Monocytes Relative: 8 %
Neutro Abs: 3 10*3/uL (ref 1.7–7.7)
Neutrophils Relative %: 53 %
Platelets: 195 10*3/uL (ref 150–400)
RBC: 3.61 MIL/uL — ABNORMAL LOW (ref 3.87–5.11)
RDW: 15.5 % (ref 11.5–15.5)
WBC: 5.6 10*3/uL (ref 4.0–10.5)
nRBC: 0 % (ref 0.0–0.2)

## 2019-09-15 MED ORDER — SODIUM CHLORIDE 0.9 % IV SOLN
10.0000 mg | Freq: Once | INTRAVENOUS | Status: AC
  Administered 2019-09-15: 10 mg via INTRAVENOUS
  Filled 2019-09-15: qty 10

## 2019-09-15 MED ORDER — HEPARIN SOD (PORK) LOCK FLUSH 100 UNIT/ML IV SOLN
500.0000 [IU] | Freq: Once | INTRAVENOUS | Status: AC | PRN
  Administered 2019-09-15: 500 [IU]

## 2019-09-15 MED ORDER — PEGFILGRASTIM 6 MG/0.6ML ~~LOC~~ PSKT
6.0000 mg | PREFILLED_SYRINGE | Freq: Once | SUBCUTANEOUS | Status: AC
  Administered 2019-09-15: 6 mg via SUBCUTANEOUS
  Filled 2019-09-15: qty 0.6

## 2019-09-15 MED ORDER — PALONOSETRON HCL INJECTION 0.25 MG/5ML
0.2500 mg | Freq: Once | INTRAVENOUS | Status: AC
  Administered 2019-09-15: 0.25 mg via INTRAVENOUS
  Filled 2019-09-15: qty 5

## 2019-09-15 MED ORDER — SODIUM CHLORIDE 0.9% FLUSH
10.0000 mL | INTRAVENOUS | Status: DC | PRN
  Administered 2019-09-15: 10 mL

## 2019-09-15 MED ORDER — SODIUM CHLORIDE 0.9 % IV SOLN
140.0000 mg/m2 | Freq: Once | INTRAVENOUS | Status: AC
  Administered 2019-09-15: 282 mg via INTRAVENOUS
  Filled 2019-09-15: qty 47

## 2019-09-15 MED ORDER — SODIUM CHLORIDE 0.9 % IV SOLN
460.5000 mg | Freq: Once | INTRAVENOUS | Status: AC
  Administered 2019-09-15: 460 mg via INTRAVENOUS
  Filled 2019-09-15: qty 46

## 2019-09-15 MED ORDER — FAMOTIDINE IN NACL 20-0.9 MG/50ML-% IV SOLN
20.0000 mg | Freq: Once | INTRAVENOUS | Status: AC
  Administered 2019-09-15: 20 mg via INTRAVENOUS
  Filled 2019-09-15: qty 50

## 2019-09-15 MED ORDER — TEMAZEPAM 15 MG PO CAPS: 15.0000 mg | ORAL_CAPSULE | Freq: Every evening | ORAL | 0 refills | Status: DC | PRN

## 2019-09-15 MED ORDER — SODIUM CHLORIDE 0.9 % IV SOLN: Freq: Once | INTRAVENOUS | Status: AC

## 2019-09-15 MED ORDER — DIPHENHYDRAMINE HCL 50 MG/ML IJ SOLN
50.0000 mg | Freq: Once | INTRAMUSCULAR | Status: AC
  Administered 2019-09-15: 50 mg via INTRAVENOUS
  Filled 2019-09-15: qty 1

## 2019-09-15 MED ORDER — SODIUM CHLORIDE 0.9 % IV SOLN
150.0000 mg | Freq: Once | INTRAVENOUS | Status: AC
  Administered 2019-09-15: 150 mg via INTRAVENOUS
  Filled 2019-09-15: qty 150

## 2019-09-15 NOTE — Patient Instructions (Signed)
[facility] Discharge Instructions for Patients Receiving Chemotherapy   Beginning January 23rd 2017 lab work for the Cancer Center will be done in the  Main lab at  on 1st floor. If you have a lab appointment with the Cancer Center please come in thru the  Main Entrance and check in at the main information desk   Today you received the following chemotherapy agents Carboplatin and Taxol  To help prevent nausea and vomiting after your treatment, we encourage you to take your nausea medication   If you develop nausea and vomiting, or diarrhea that is not controlled by your medication, call the clinic.  The clinic phone number is [phone]. Office hours are Monday-Friday 8:30am-5:00pm.  BELOW ARE SYMPTOMS THAT SHOULD BE REPORTED IMMEDIATELY:  *FEVER GREATER THAN 101.0 F  *CHILLS WITH OR WITHOUT FEVER  NAUSEA AND VOMITING THAT IS NOT CONTROLLED WITH YOUR NAUSEA MEDICATION  *UNUSUAL SHORTNESS OF BREATH  *UNUSUAL BRUISING OR BLEEDING  TENDERNESS IN MOUTH AND THROAT WITH OR WITHOUT PRESENCE OF ULCERS  *URINARY PROBLEMS  *BOWEL PROBLEMS  UNUSUAL RASH Items with * indicate a potential emergency and should be followed up as soon as possible. If you have an emergency after office hours please contact your primary care physician or go to the nearest emergency department.  Please call the clinic during office hours if you have any questions or concerns.   You may also contact the Patient Navigator at [phone] should you have any questions or need assistance in obtaining follow up care.      Resources For Cancer Patients and their Caregivers ? American Cancer Society: Can assist with transportation, wigs, general needs, runs Look Good Feel Better.        1-888-227-6333 ? Cancer Care: Provides financial assistance, online support groups, medication/co-pay assistance.  1-800-813-HOPE (4673) ? [facility] Assists  [place] cancer patients and their families through emotional , educational and financial support.  336-427-4357 ? [place] DSS Where to apply for food stamps, Medicaid and utility assistance. 336-342-1394 ? [facility]: Transportation to medical appointments. 336-347-2287 ? Social Security Administration: May apply for disability if have a Stage IV cancer. 336-342-7796 1-800-772-1213 ? [place] Aging, Disability and Transit Services: Assists with nutrition, care and transit needs. 336-349-2343          

## 2019-09-15 NOTE — Progress Notes (Signed)
Patient was assessed by Dr. Delton Coombes and labs have been reviewed.  Creatinine 1.04, Dr. Delton Coombes aware.  Patient is okay to proceed with treatment today. Primary RN and pharmacy aware.

## 2019-09-15 NOTE — Patient Instructions (Signed)
[place] at [facility] Discharge Instructions  You were seen today by Dr. Delton Coombes. He went over your recent results. You received your treatment today. You will be prescribed temazepam to help you go to sleep; take 1 tablet or 1/2 tablet at night as needed. You will be scheduled for a CT scan of your abdomen before your next visit. Dr. Delton Coombes will see you back in 3 weeks for labs and follow up.   Thank you for choosing [place] at [facility] to provide your oncology and hematology care.  To afford each patient quality time with our provider, please arrive at least 15 minutes before your scheduled appointment time.   If you have a lab appointment with the [facility] please come in thru the Main Entrance and check in at the main information desk  You need to re-schedule your appointment should you arrive 10 or more minutes late.  We strive to give you quality time with our providers, and arriving late affects you and other patients whose appointments are after yours.  Also, if you no show three or more times for appointments you may be dismissed from the clinic at the providers discretion.     Again, thank you for choosing [facility].  Our hope is that these requests will decrease the amount of time that you wait before being seen by our physicians.       _____________________________________________________________  Should you have questions after your visit to [facility], please contact our office at [phone] between the hours of 8:00 a.m. and 4:30 p.m.  Voicemails left after 4:00 p.m. will not be returned until the following business day.  For prescription refill requests, have your pharmacy contact our office and allow 72 hours.    Cancer Center Support Programs:   > Cancer Support Group  2nd Tuesday of the month 1pm-2pm, Journey Room

## 2019-09-15 NOTE — Progress Notes (Signed)
[address] [place] [address], [place] [address]   CLINIC:  Medical Oncology/Hematology  PCP:  Biagio Borg, MD [place] / [place] [address] [phone]   REASON FOR VISIT:  Follow-up for ovarian cancer  PRIOR THERAPY: None  NGS Results: Foundation 1 MS--stable  CURRENT THERAPY: Carboplatin and paclitaxel every 3 weeks  BRIEF ONCOLOGIC HISTORY:  Oncology History  Primary ovarian adenocarcinoma, unspecified laterality ([place])  06/20/2019 Initial Diagnosis   Primary ovarian adenocarcinoma, unspecified laterality ([place])   07/01/2019 Genetic Testing   Foundation One     07/11/2019 Genetic Testing   Negative genetic testing:  No pathogenic variants detected on the [facility] Multi-Cancer Panel. The report date is 07/11/2019.  The Multi-Cancer Panel offered by [facility] includes sequencing and/or deletion duplication testing of the following 85 genes: AIP, ALK, APC, ATM, AXIN2,BAP1,  BARD1, BLM, BMPR1A, BRCA1, BRCA2, BRIP1, CASR, CDC73, CDH1, CDK4, CDKN1B, CDKN1C, CDKN2A (p14ARF), CDKN2A (p16INK4a), CEBPA, CHEK2, CTNNA1, DICER1, DIS3L2, EGFR (c.2369C>T, p.Thr790Met variant only), EPCAM (Deletion/duplication testing only), FH, FLCN, GATA2, GPC3, GREM1 (Promoter region deletion/duplication testing only), HOXB13 (c.251G>A, p.Gly84Glu), HRAS, KIT, MAX, MEN1, MET, MITF (c.952G>A, p.Glu318Lys variant only), MLH1, MSH2, MSH3, MSH6, MUTYH, NBN, NF1, NF2, NTHL1, PALB2, PDGFRA, PHOX2B, PMS2, POLD1, POLE, POT1, PRKAR1A, PTCH1, PTEN, RAD50, RAD51C, RAD51D, RB1, RECQL4, RET, RNF43, RUNX1, SDHAF2, SDHA (sequence changes only), SDHB, SDHC, SDHD, SMAD4, SMARCA4, SMARCB1, SMARCE1, STK11, SUFU, TERC, TERT, TMEM127, TP53, TSC1, TSC2, VHL, WRN and WT1.   07/28/2019 -  Chemotherapy   The patient had palonosetron (ALOXI) injection 0.25 mg, 0.25 mg, Intravenous,  Once, 2 of 6 cycles Administration: 0.25 mg (07/28/2019), 0.25 mg (08/18/2019) pegfilgrastim (NEULASTA ONPRO KIT) injection 6  mg, 6 mg, Subcutaneous, Once, 2 of 6 cycles Administration: 6 mg (07/28/2019), 6 mg (08/18/2019) CARBOplatin (PARAPLATIN) 470 mg in sodium chloride 0.9 % 250 mL chemo infusion, 470 mg (100 % of original dose 470 mg), Intravenous,  Once, 2 of 6 cycles Dose modification:   (original dose 470 mg, Cycle 1, Reason: Provider Judgment),   (original dose 568.2 mg, Cycle 2) Administration: 470 mg (07/28/2019), 570 mg (08/18/2019) fosaprepitant (EMEND) 150 mg in sodium chloride 0.9 % 145 mL IVPB, 150 mg, Intravenous,  Once, 2 of 6 cycles Administration: 150 mg (07/28/2019), 150 mg (08/18/2019) PACLitaxel (TAXOL) 282 mg in sodium chloride 0.9 % 250 mL chemo infusion (> 63m/m2), 140 mg/m2 = 282 mg (80 % of original dose 175 mg/m2), Intravenous,  Once, 2 of 6 cycles Dose modification: 140 mg/m2 (80 % of original dose 175 mg/m2, Cycle 1, Reason: Provider Judgment), 150 mg/m2 (original dose 175 mg/m2, Cycle 2, Reason: Provider Judgment) Administration: 282 mg (07/28/2019), 300 mg (08/18/2019)  for chemotherapy treatment.      CANCER STAGING: Cancer Staging No matching staging information was found for the patient.  INTERVAL HISTORY:  Laura [name] a 74y.o. female, returns for routine follow-up and consideration for next cycle of chemotherapy. MEriyonnawas last seen on 09/08/2019.  Due for cycle #3 of carboplatin and paclitaxel today.   Today she reports feeling better. She finished taking her antibiotics and her bladder pain has improved, but her bladder continues hurting at night. She takes morphine to help her sleep; she has tried Tylenol PM and melatonin for sleep, but nothing helps her to go to sleep and some things make her restless. She reports having regular BM's. She still has a cough, though not as severe, and she denies having sinus pressure or runny nose.  She continues complaining of fatigue and becomes SOB when exerting herself. She denies having any muscles aches or pains.   Overall, she feels ready  for next cycle of chemo today.    REVIEW OF SYSTEMS:  Review of Systems  Constitutional: Positive for appetite change (severely decreased) and fatigue (depleted).  Respiratory: Positive for cough (improved) and shortness of breath (w/ exertion).   Gastrointestinal: Positive for abdominal pain (suprapubic pain at night).  Psychiatric/Behavioral: Positive for sleep disturbance.  All other systems reviewed and are negative.   PAST MEDICAL/SURGICAL HISTORY:  Past Medical History:  Diagnosis Date  . Anxiety   . Arthritis    back of neck, bones spurs on neck  . Cataract   . Cervical disc disease   . Diabetes mellitus   . Family history of thyroid cancer   . Hyperlipidemia   . Hypertension   . Mucoid cyst of joint    right thumb  . Neuropathy   . Port-A-Cath in place 07/21/2019  . Reflux   . Sleep apnea    wears CPAP nightly  . Vertigo    Past Surgical History:  Procedure Laterality Date  . BLADDER SURGERY    . BREAST EXCISIONAL BIOPSY Bilateral   . BREAST SURGERY    . CHOLECYSTECTOMY    . fibroids removed     breast (both breasts)  . IR IMAGING GUIDED PORT INSERTION  07/26/2019  . MASS EXCISION Right 06/26/2016   Procedure: EXCISION MUCOID TUMOR RIGHT THUMB, IP RIGHT THUMB;  Surgeon: Daryll Brod, MD;  Location: [facility];  Service: Orthopedics;  Laterality: Right;  . PARTIAL HYSTERECTOMY      SOCIAL HISTORY:  Social History   Socioeconomic History  . Marital status: Divorced    Spouse name: Not on file  . Number of children: 4  . Years of education: 17  . Highest education level: Not on file  Occupational History  . Occupation: retired Geographical information systems officer  Tobacco Use  . Smoking status: Never Smoker  . Smokeless tobacco: Never Used  Vaping Use  . Vaping Use: Never used  Substance and Sexual Activity  . Alcohol use: No    Alcohol/week: 0.0 standard drinks  . Drug use: No  . Sexual activity: Not Currently  Other Topics Concern  . Not on  file  Social History Narrative  . Not on file   Social Determinants of Health   Financial Resource Strain: Low Risk   . Difficulty of Paying Living Expenses: Not hard at all  Food Insecurity: No Food Insecurity  . Worried About Charity fundraiser in the Last Year: Never true  . Ran Out of Food in the Last Year: Never true  Transportation Needs: No Transportation Needs  . Lack of Transportation (Medical): No  . Lack of Transportation (Non-Medical): No  Physical Activity: Inactive  . Days of Exercise per Week: 0 days  . Minutes of Exercise per Session: 0 min  Stress: No Stress Concern Present  . Feeling of Stress : Not at all  Social Connections: Moderately Integrated  . Frequency of Communication with Friends and Family: More than three times a week  . Frequency of Social Gatherings with Friends and Family: More than three times a week  . Attends Religious Services: More than 4 times per year  . Active Member of Clubs or Organizations: Yes  . Attends Archivist Meetings: Never  . Marital Status: Divorced  Human resources officer Violence: Not At Risk  .  Fear of Current or Ex-Partner: No  . Emotionally Abused: No  . Physically Abused: No  . Sexually Abused: No    FAMILY HISTORY:  Family History  Problem Relation Age of Onset  . Hypertension Other   . Diabetes Father   . Hypertension Father   . Diabetes Sister   . Hypertension Mother   . Hypertension Sister   . Stroke Sister   . Diabetes Maternal Aunt   . Thyroid cancer Daughter 33  . Colon cancer Neg Hx   . Esophageal cancer Neg Hx   . Stomach cancer Neg Hx   . Rectal cancer Neg Hx   . Breast cancer Neg Hx   . Endometrial cancer Neg Hx   . Ovarian cancer Neg Hx     CURRENT MEDICATIONS:  Current Outpatient Medications  Medication Sig Dispense Refill  . acetaminophen (TYLENOL) 500 MG tablet Take 500 mg by mouth 2 (two) times daily as needed for moderate pain or headache.     Marland Kitchen amLODipine (NORVASC) 5 MG tablet  TAKE ONE (1) TABLET BY MOUTH EVERY DAY (Patient taking differently: Take 5 mg by mouth daily. ) 90 tablet 1  . Blood Glucose Monitoring Suppl (ONE TOUCH ULTRA 2) w/Device KIT Use as directed 1 each 0  . CARBOPLATIN IV Inject into the vein every 21 ( twenty-one) days.    . cetirizine (ZYRTEC) 10 MG tablet Take 10 mg by mouth daily.    . cholecalciferol (VITAMIN D3) 25 MCG (1000 UNIT) tablet Take 2,000 Units by mouth daily.    . ciprofloxacin (CIPRO) 500 MG tablet Take 1 tablet (500 mg total) by mouth 2 (two) times daily. 10 tablet 0  . furosemide (LASIX) 20 MG tablet Take 1 tablet (20 mg total) by mouth daily. 30 tablet 2  . glimepiride (AMARYL) 2 MG tablet TAKE ONE TABLET BY MOUTH DAILY 90 tablet 3  . Lactulose 20 GM/30ML SOLN Take 30 mLs (20 g total) by mouth at bedtime. (Patient not taking: Reported on 07/20/2019) 480 mL 3  . Lancets (ONETOUCH ULTRASOFT) lancets USE TO CHECK BLOOD SUGAR TWICE DAILY 100 each 3  . lidocaine-prilocaine (EMLA) cream Apply a small amount to port a cath site and cover with plastic wrap 1 hour prior to chemotherapy appointments 30 g 3  . loperamide (IMODIUM) 2 MG capsule Take by mouth as needed for diarrhea or loose stools.     Marland Kitchen losartan-hydrochlorothiazide (HYZAAR) 100-12.5 MG tablet Take 1 tablet by mouth daily. 90 tablet 3  . morphine (MSIR) 15 MG tablet Take 1 tablet (15 mg total) by mouth 2 (two) times daily as needed for severe pain. 30 tablet 0  . ONETOUCH ULTRA test strip USE TO CHECK BLOOD SUGARS TWO TIMES DAILY 100 strip 3  . OVER THE COUNTER MEDICATION Apply 1 application topically daily as needed (pain). Triderma otc pain cream     . PACLITAXEL IV Inject 175 mg/m2 into the vein every 21 ( twenty-one) days.    . pantoprazole (PROTONIX) 40 MG tablet TAKE 1 TABLET (40 MG TOTAL) BY MOUTH DAILY. (Patient not taking: Reported on 08/04/2019) 90 tablet 1  . Pegfilgrastim (NEULASTA ONPRO [place]) Inject into the skin every 21 ( twenty-one) days.    . phenylephrine (SUDAFED  PE) 10 MG TABS tablet     . Phenylephrine-APAP-guaiFENesin (MUCINEX SINUS-MAX) 10-650-400 MG/20ML LIQD     . Phenylephrine-DM-GG (TUSSIN CF MAX MULTI-SYMPTOM) 5-10-200 MG/5ML LIQD     . prochlorperazine (COMPAZINE) 10 MG tablet Take 1 tablet (10  mg total) by mouth every 6 (six) hours as needed (Nausea or vomiting). 30 tablet 1  . sennosides-docusate sodium (SENOKOT-S) 8.6-50 MG tablet Take 2 tablets by mouth daily.    . vitamin B-12 (CYANOCOBALAMIN) 1000 MCG tablet Take 2,000 mcg by mouth daily.     No current facility-administered medications for this visit.    ALLERGIES:  Allergies  Allergen Reactions  . Hydrocodone-Homatropine Other (See Comments)    Vertigo *pt strongly prefers to never take*  . Sulfa Antibiotics Hives, Itching and Swelling    Tongue swells  . Augmentin [Amoxicillin-Pot Clavulanate]     Diarrhea; can take PCN/ Amoxicillin  . Codeine Itching  . Crestor [Rosuvastatin Calcium] Other (See Comments)    Did something to memory    . Doxycycline Other (See Comments)    Severe rectal Gas.  . Gabapentin     disoriented  . Keflex [Cephalexin] Diarrhea and Nausea And Vomiting  . Naproxen Other (See Comments)    Stomach cramps  . Statins     Muscle weakness  . Prednisone Anxiety    *pt strongly prefers to never be given prednisone*     PHYSICAL EXAM:  Performance status (ECOG): 1 - Symptomatic but completely ambulatory  There were no vitals filed for this visit. Wt Readings from Last 3 Encounters:  09/08/19 188 lb 6.4 oz (85.5 kg)  08/18/19 196 lb 9.6 oz (89.2 kg)  08/04/19 192 lb (87.1 kg)   Physical Exam Vitals reviewed.  Constitutional:      Appearance: Normal appearance. She is obese.  Cardiovascular:     Rate and Rhythm: Normal rate and regular rhythm.     Pulses: Normal pulses.     Heart sounds: Normal heart sounds.  Pulmonary:     Effort: Pulmonary effort is normal.     Breath sounds: Normal breath sounds.  Chest:     Comments: Port-a-Cath in R  chest Neurological:     General: No focal deficit present.     Mental Status: She is alert and oriented to person, place, and time.  Psychiatric:        Mood and Affect: Mood normal.        Behavior: Behavior normal.     LABORATORY DATA:  I have reviewed the labs as listed.  CBC Latest Ref Rng & Units 09/08/2019 08/18/2019 08/04/2019  WBC 4.0 - 10.5 K/uL 6.0 7.7 22.3(H)  Hemoglobin 12.0 - 15.0 g/dL 10.6(L) 10.8(L) 11.4(L)  Hematocrit 36 - 46 % 32.4(L) 32.4(L) 34.8(L)  Platelets 150 - 400 K/uL 204 210 208   CMP Latest Ref Rng & Units 09/08/2019 08/18/2019 08/04/2019  Glucose 70 - 99 mg/dL 192(H) 177(H) 132(H)  BUN 8 - 23 mg/dL 18 15 21   Creatinine 0.44 - 1.00 mg/dL 0.94 0.82 1.13(H)  Sodium 135 - 145 mmol/L 130(L) 135 133(L)  Potassium 3.5 - 5.1 mmol/L 3.7 3.8 3.6  Chloride 98 - 111 mmol/L 99 102 97(L)  CO2 22 - 32 mmol/L 21(L) 23 24  Calcium 8.9 - 10.3 mg/dL 9.5 9.3 9.6  Total Protein 6.5 - 8.1 g/dL 6.9 6.8 7.2  Total Bilirubin 0.3 - 1.2 mg/dL 0.5 0.4 0.4  Alkaline Phos 38 - 126 U/L 59 61 96  AST 15 - 41 U/L 19 17 24   ALT 0 - 44 U/L 16 15 29     DIAGNOSTIC IMAGING:  I have independently reviewed the scans and discussed with the patient. DG Chest 2 View  Result Date: 09/08/2019 CLINICAL DATA:  Cough.  Chemotherapy  EXAM: CHEST - 2 VIEW COMPARISON:  Radiograph 02/23/2017 FINDINGS: Normal cardiac silhouette. Port in the anterior chest wall with tip in distal SVC. Mild interval increase in mild interstitial markings. Mild lower lobe predominance. No pleural fluid. IMPRESSION: Mild increase in interstitial markings in lower lobes could represent infection or edema. Electronically Signed   By: Suzy Bouchard M.D.   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 Mass measures 6 x 5.8 cm and is partially calcified.  Separate solid component superior to this in the left periaortic region measures 3.5 cm, SUV 14.3. Cystic component medial to the lower pole of the left kidney is without hypermetabolic activity, possibly a lymphocele. -CT-guided biopsy of the right retroperitoneal lymph node consistent with adenocarcinoma with psammoma bodies. Morphology and immunotherapy consistent with ovarian serous carcinoma/primary peritoneal carcinoma. -Germline mutation testing was negative. -Foundation 1 testing shows MS-stable. Loss of heterozygosity was less than 16%. -She met with Dr. Denman George who recommended neoadjuvant chemotherapywith 3-6 cycles of carboplatin and paclitaxel with interim staging after 3 cycles. If there is substantial response unless encasement of the aorta and vena cava, consideration for debulking procedure can be possible at that time. -CA-125 346 on 06/23/2019. -Cycle 1 of carboplatin and paclitaxel on 07/28/2019.   PLAN:  1. Ovarian serous carcinoma: -We held her chemotherapy last week because of cold symptoms. -She reports that cold symptoms are better at this time.  She completed Cipro for her UTI. -Reviewed her labs from today which showed normal LFTs.  Creatinine is 1.04.  CBC shows hemoglobin 10.7 with normal white count and platelets. -I will proceed with cycle 3 today with dose reduction similar to cycle 1.  We will plan to see her back in 3 weeks for follow-up with repeat CA-125 level and a CTAP with contrast.   2. Low back pain: -This has completely improved.  She is not taking MSIR for pain recently.  However she took 1 pill last night to sleep.  3. Constipation: -Continue Senokot 1 tablet daily.  4. Hypertension: -Continue Norvasc and losartan/HCTZ.  5. Leg swelling: -Continue Lasix as needed.  6.  Difficulty sleeping: -We will start her on Restoril 15 mg at bedtime as needed.  We will titrate up as needed.   Orders placed this encounter:  No orders of the  defined types were placed in this encounter.    Derek Jack, MD [place] [phone]   I, Milinda Antis, am acting as a scribe for Dr. Sanda Linger.  I, Derek Jack MD, have reviewed the above documentation for accuracy and completeness, and I agree with the above.

## 2019-09-15 NOTE — Progress Notes (Signed)
Laura [name] presents today for D1C3 Carbo/Taxol. Pt denies any new changes or symptoms since last treatment. Lab results and vitals have been reviewed and are stable and within parameters for treatment. Patient has been assessed by Dr. Delton Coombes who has approved proceeding with treatment today as planned.  I spoke with patient about going to have her ankle x-ray done before putting her on-pro on today. She states that she doesn't want to have it done today because she thinks its "just her tendinitis." She states that she will arrange to have it done at a later time if it continues to bother her.   Infusions tolerated without incident or complaint. VSS upon completion of treatment. On pro applied to left arm, flashing green light visualized prior to discharge. Pt instructed on signs/problems to report and understanding verbalized. Port flushed and deaccessed per protocol, see MAR and IV flowsheet for details. Discharged in satisfactory condition with follow up instructions.

## 2019-09-19 ENCOUNTER — Other Ambulatory Visit: Payer: Self-pay

## 2019-09-19 ENCOUNTER — Emergency Department (HOSPITAL_COMMUNITY)
Admission: EM | Admit: 2019-09-19 | Discharge: 2019-09-19 | Disposition: A | Discharge status: Home / Self Care | Payer: Medicare Other | Attending: Emergency Medicine | Admitting: Emergency Medicine

## 2019-09-19 ENCOUNTER — Encounter (HOSPITAL_COMMUNITY): Payer: Self-pay | Admitting: Emergency Medicine

## 2019-09-19 ENCOUNTER — Emergency Department (HOSPITAL_COMMUNITY): Payer: Medicare Other

## 2019-09-19 DIAGNOSIS — R42 Dizziness and giddiness: Secondary | ICD-10-CM | POA: Diagnosis not present

## 2019-09-19 DIAGNOSIS — Z79899 Other long term (current) drug therapy: Secondary | ICD-10-CM | POA: Insufficient documentation

## 2019-09-19 DIAGNOSIS — R451 Restlessness and agitation: Secondary | ICD-10-CM | POA: Diagnosis not present

## 2019-09-19 DIAGNOSIS — I10 Essential (primary) hypertension: Secondary | ICD-10-CM | POA: Diagnosis not present

## 2019-09-19 DIAGNOSIS — R531 Weakness: Secondary | ICD-10-CM | POA: Diagnosis not present

## 2019-09-19 DIAGNOSIS — E119 Type 2 diabetes mellitus without complications: Secondary | ICD-10-CM | POA: Diagnosis not present

## 2019-09-19 LAB — CBC WITH DIFFERENTIAL/PLATELET
Band Neutrophils: 3 %
Basophils Absolute: 0 10*3/uL (ref 0.0–0.1)
Basophils Relative: 0 %
Eosinophils Absolute: 0 10*3/uL (ref 0.0–0.5)
Eosinophils Relative: 0 %
HCT: 33.3 % — ABNORMAL LOW (ref 36.0–46.0)
Hemoglobin: 11 g/dL — ABNORMAL LOW (ref 12.0–15.0)
Lymphocytes Relative: 7 %
Lymphs Abs: 1.5 10*3/uL (ref 0.7–4.0)
MCH: 29.6 pg (ref 26.0–34.0)
MCHC: 33 g/dL (ref 30.0–36.0)
MCV: 89.8 fL (ref 80.0–100.0)
Metamyelocytes Relative: 1 %
Monocytes Absolute: 0.7 10*3/uL (ref 0.1–1.0)
Monocytes Relative: 3 %
Neutro Abs: 19.5 10*3/uL — ABNORMAL HIGH (ref 1.7–7.7)
Neutrophils Relative %: 86 %
Platelets: 187 10*3/uL (ref 150–400)
RBC: 3.71 MIL/uL — ABNORMAL LOW (ref 3.87–5.11)
RDW: 15.8 % — ABNORMAL HIGH (ref 11.5–15.5)
WBC: 21.9 10*3/uL — ABNORMAL HIGH (ref 4.0–10.5)
nRBC: 0 % (ref 0.0–0.2)

## 2019-09-19 LAB — URINALYSIS, ROUTINE W REFLEX MICROSCOPIC
Bilirubin Urine: NEGATIVE
Glucose, UA: NEGATIVE mg/dL
Hgb urine dipstick: NEGATIVE
Ketones, ur: NEGATIVE mg/dL
Leukocytes,Ua: NEGATIVE
Nitrite: NEGATIVE
Protein, ur: NEGATIVE mg/dL
Specific Gravity, Urine: 1.008 (ref 1.005–1.030)
pH: 7 (ref 5.0–8.0)

## 2019-09-19 LAB — BASIC METABOLIC PANEL
Anion gap: 11 (ref 5–15)
BUN: 18 mg/dL (ref 8–23)
CO2: 23 mmol/L (ref 22–32)
Calcium: 9.8 mg/dL (ref 8.9–10.3)
Chloride: 100 mmol/L (ref 98–111)
Creatinine, Ser: 0.87 mg/dL (ref 0.44–1.00)
GFR calc Af Amer: >60 mL/min (ref >60)
GFR calc non Af Amer: >60 mL/min (ref >60)
Glucose, Bld: 85 mg/dL (ref 70–99)
Potassium: 4.3 mmol/L (ref 3.5–5.1)
Sodium: 134 mmol/L — ABNORMAL LOW (ref 135–145)

## 2019-09-19 MED ORDER — LORAZEPAM 1 MG PO TABS
1.0000 mg | ORAL_TABLET | Freq: Once | ORAL | Status: AC
  Administered 2019-09-19: 1 mg via ORAL
  Filled 2019-09-19: qty 1

## 2019-09-19 NOTE — Discharge Instructions (Addendum)
Follow-up with hematology oncology clinic.  Today's work-up without any significant findings other than this her white blood cell count was elevated but that is probably related to the medication that they gave you to boost your white blood cells.  Chest x-ray was negative as well.  Return for any new or worse symptoms.

## 2019-09-19 NOTE — ED Provider Notes (Signed)
[facility] EMERGENCY DEPARTMENT Provider Note   CSN: [id number] Arrival date & time: 09/19/19  1329     History Chief Complaint  Patient presents with  . Weakness    Laura [name] is a 74 y.o. female.  HPI   77yF presenting just not feeling well. Earlier today felt sore all over. Felt restless. Kept getting up and then sitting back down. Couldn't get comfortable. Has felt generalized weakness. Since being in the emergency room she "had a good cry" and feels significantly better. She has been feeling tired recently but is having difficulty actually sleeping. No fever. Appetite fair. Didn't eat much yesterday though because "everything I tried tasted like syrup." No urinary complaints. No cough/dyspnea. No v/d.   Past Medical History:  Diagnosis Date  . Anxiety   . Arthritis    back of neck, bones spurs on neck  . Cataract   . Cervical disc disease   . Diabetes mellitus   . Family history of thyroid cancer   . Hyperlipidemia   . Hypertension   . Mucoid cyst of joint    right thumb  . Neuropathy   . Port-A-Cath in place 07/21/2019  . Reflux   . Sleep apnea    wears CPAP nightly  . Vertigo     Patient Active Problem List   Diagnosis Date Noted  . Port-A-Cath in place 07/21/2019  . Genetic testing 07/15/2019  . Family history of thyroid cancer   . Primary ovarian adenocarcinoma, unspecified laterality ([place]) 06/20/2019  . Paronychia, finger, left 04/15/2019  . Vaginitis 04/15/2019  . Abdominal pain 04/04/2019  . Retroperitoneal mass 04/03/2019  . Acute gouty arthritis 12/17/2018  . Bilateral hearing loss 12/21/2017  . Eustachian tube disorder, bilateral 12/21/2017  . Cough 02/23/2017  . Allergic conjunctivitis 12/12/2016  . Left hand pain 05/26/2016  . Mucoid cyst, joint 05/26/2016  . Pain in finger of right hand 05/26/2016  . Primary osteoarthritis of both first carpometacarpal joints 05/26/2016  . Skin avulsion 05/26/2016  . Adhesive capsulitis of left shoulder  03/20/2016  . Fatigue 12/16/2015  . Acute bronchitis 10/31/2015  . Urinary frequency 03/09/2015  . Hearing loss, sensorineural, asymmetrical 11/09/2014  . Vestibular hypofunction, left 11/09/2014  . Class 1 obesity due to excess calories with body mass index (BMI) of 33.0 to 33.9 in adult 07/25/2014  . Orthostatic hypotension 07/07/2014  . Upper airway cough syndrome 06/26/2014  . Diarrhea 05/02/2014  . Dizziness 04/12/2014  . Neuropathy   . Rash and nonspecific skin eruption 08/30/2013  . Paresthesia of both feet 08/30/2013  . Blurred vision 02/15/2013  . Eye pain 02/15/2013  . Sinusitis, chronic 10/07/2012  . Left shoulder pain 04/15/2012  . Preventative health care 10/22/2011  . Cervical disc disease 10/22/2011  . Bilateral hand pain 08/05/2011  . SNHL (sensorineural hearing loss) 08/04/2011  . Fibrocystic breast disease 07/02/2011  . Hypertension 07/01/2011  . GERD (gastroesophageal reflux disease) 06/29/2011  . Bloating 06/29/2011  . Back pain 03/13/2011  . Vertigo 01/04/2011  . Nystagmus 01/04/2011  . Insomnia 10/21/2010  . Constipation 08/17/2009  . Vitamin D deficiency 05/15/2009  . DYSPNEA 03/13/2009  . Depression with anxiety 01/16/2009  . Irritable bowel syndrome 01/16/2009  . Primary osteoarthritis of both hands 12/15/2008  . Type 2 diabetes mellitus ([place]) 11/03/2008  . Hyperlipidemia 11/03/2008  . Anxiety state 11/03/2008  . OVERACTIVE BLADDER 11/03/2008  . OSA (obstructive sleep apnea) 11/03/2008  . MURMUR 11/03/2008    Past Surgical History:  Procedure Laterality  Date  . BLADDER SURGERY    . BREAST EXCISIONAL BIOPSY Bilateral   . BREAST SURGERY    . CHOLECYSTECTOMY    . fibroids removed     breast (both breasts)  . IR IMAGING GUIDED PORT INSERTION  07/26/2019  . MASS EXCISION Right 06/26/2016   Procedure: EXCISION MUCOID TUMOR RIGHT THUMB, IP RIGHT THUMB;  Surgeon: Daryll Brod, MD;  Location: [place];  Service: Orthopedics;   Laterality: Right;  . PARTIAL HYSTERECTOMY      OB History   No obstetric history on file.    Family History  Problem Relation Age of Onset  . Hypertension Other   . Diabetes Father   . Hypertension Father   . Diabetes Sister   . Hypertension Mother   . Hypertension Sister   . Stroke Sister   . Diabetes Maternal Aunt   . Thyroid cancer Daughter 45  . Colon cancer Neg Hx   . Esophageal cancer Neg Hx   . Stomach cancer Neg Hx   . Rectal cancer Neg Hx   . Breast cancer Neg Hx   . Endometrial cancer Neg Hx   . Ovarian cancer Neg Hx    Social History   Tobacco Use  . Smoking status: Never Smoker  . Smokeless tobacco: Never Used  Vaping Use  . Vaping Use: Never used  Substance Use Topics  . Alcohol use: No    Alcohol/week: 0.0 standard drinks  . Drug use: No   Home Medications Prior to Admission medications   Medication Sig Start Date End Date Taking? Authorizing Provider  acetaminophen (TYLENOL) 500 MG tablet Take 500 mg by mouth 2 (two) times daily as needed for moderate pain or headache.  Patient not taking: Reported on 09/15/2019    [provider]  amLODipine (NORVASC) 5 MG tablet TAKE ONE (1) TABLET BY MOUTH EVERY DAY Patient taking differently: Take 5 mg by mouth daily.  02/23/19   Biagio Borg, MD  Blood Glucose Monitoring Suppl (ONE TOUCH ULTRA 2) w/Device KIT Use as directed 04/06/15   Biagio Borg, MD  CARBOPLATIN IV Inject into the vein every 21 ( twenty-one) days. 07/28/19   [provider]  cetirizine (ZYRTEC) 10 MG tablet Take 10 mg by mouth daily.    [provider]  cholecalciferol (VITAMIN D3) 25 MCG (1000 UNIT) tablet Take 2,000 Units by mouth daily.    [provider]  ciprofloxacin (CIPRO) 500 MG tablet Take 1 tablet (500 mg total) by mouth 2 (two) times daily. 09/08/19   Derek Jack, MD  furosemide (LASIX) 20 MG tablet Take 1 tablet (20 mg total) by mouth daily. 07/28/19   Derek Jack, MD  glimepiride  (AMARYL) 2 MG tablet TAKE ONE TABLET BY MOUTH DAILY 08/17/19   Biagio Borg, MD  Lactulose 20 GM/30ML SOLN Take 30 mLs (20 g total) by mouth at bedtime. Patient not taking: Reported on 07/20/2019 06/15/19   Derek Jack, MD  Lancets [facility] ULTRASOFT) lancets USE TO CHECK BLOOD SUGAR TWICE DAILY 06/27/19   Biagio Borg, MD  lidocaine-prilocaine (EMLA) cream Apply a small amount to port a cath site and cover with plastic wrap 1 hour prior to chemotherapy appointments 07/21/19   Derek Jack, MD  loperamide (IMODIUM) 2 MG capsule Take by mouth as needed for diarrhea or loose stools.     [provider]  losartan-hydrochlorothiazide (HYZAAR) 100-12.5 MG tablet Take 1 tablet by mouth daily. 07/11/19   Biagio Borg,  MD  morphine (MSIR) 15 MG tablet Take 1 tablet (15 mg total) by mouth 2 (two) times daily as needed for severe pain. 08/10/19   Glennie Isle, NP-C  ONETOUCH ULTRA test strip USE TO CHECK BLOOD SUGARS TWO TIMES DAILY 06/27/19   Biagio Borg, MD  OVER THE COUNTER MEDICATION Apply 1 application topically daily as needed (pain). Triderma otc pain cream     [provider]  PACLITAXEL IV Inject 175 mg/m2 into the vein every 21 ( twenty-one) days. 07/28/19   [provider]  pantoprazole (PROTONIX) 40 MG tablet TAKE 1 TABLET (40 MG TOTAL) BY MOUTH DAILY. Patient not taking: Reported on 08/04/2019 03/21/19 03/20/20  Biagio Borg, MD  Pegfilgrastim (NEULASTA ONPRO [place]) Inject into the skin every 21 ( twenty-one) days. 07/28/19   [provider]  phenylephrine (SUDAFED PE) 10 MG TABS tablet  08/27/19   [provider]  Phenylephrine-APAP-guaiFENesin [facility]) [id number] MG/20ML LIQD  09/02/19   [provider]  Phenylephrine-DM-GG (TUSSIN CF MAX MULTI-SYMPTOM) 5-10-200 MG/5ML LIQD  08/26/19   [provider]  prochlorperazine (COMPAZINE) 10 MG tablet Take 1 tablet (10 mg total) by mouth every 6 (six) hours as needed (Nausea  or vomiting). 07/21/19   Derek Jack, MD  sennosides-docusate sodium (SENOKOT-S) 8.6-50 MG tablet Take 2 tablets by mouth daily.    [provider]  temazepam (RESTORIL) 15 MG capsule Take 1 capsule (15 mg total) by mouth at bedtime as needed for sleep. 09/15/19   Derek Jack, MD  vitamin B-12 (CYANOCOBALAMIN) 1000 MCG tablet Take 2,000 mcg by mouth daily.    [provider]    Allergies    Hydrocodone-homatropine, Sulfa antibiotics, Augmentin [amoxicillin-pot clavulanate], Codeine, Crestor [rosuvastatin calcium], Doxycycline, Gabapentin, Keflex [cephalexin], Naproxen, Statins, and Prednisone  Review of Systems   Review of Systems All systems reviewed and negative, other than as noted in HPI.  Physical Exam Updated Vital Signs BP 125/64 (BP Location: Right Arm)   Pulse 80   Temp 98.2 F (36.8 C) (Oral)   Resp 18   Ht _0  (1.626 m)   Wt 85.3 kg   SpO2 99%   BMI 32.27 kg/m   Physical Exam Vitals and nursing note reviewed.  Constitutional:      General: She is not in acute distress.    Appearance: She is well-developed.  HENT:     Head: Normocephalic and atraumatic.  Eyes:     General:        Right eye: No discharge.        Left eye: No discharge.     Conjunctiva/sclera: Conjunctivae normal.  Cardiovascular:     Rate and Rhythm: Normal rate and regular rhythm.     Heart sounds: Normal heart sounds. No murmur heard.  No friction rub. No gallop.      Comments: Port R chest Pulmonary:     Effort: Pulmonary effort is normal. No respiratory distress.     Breath sounds: Normal breath sounds.  Abdominal:     General: There is no distension.     Palpations: Abdomen is soft.     Tenderness: There is no abdominal tenderness.  Musculoskeletal:        General: No tenderness.     Cervical back: Neck supple.  Skin:    General: Skin is warm and dry.  Neurological:     Mental Status: She is alert.  Psychiatric:        Behavior: Behavior normal.  Thought Content: Thought content normal.     ED Results / Procedures / Treatments   Labs (all labs ordered are listed, but only abnormal results are displayed) Labs Reviewed  CBC WITH DIFFERENTIAL/PLATELET - Abnormal; Notable for the following components:      Result Value   WBC 21.9 (*)    RBC 3.71 (*)    Hemoglobin 11.0 (*)    HCT 33.3 (*)    RDW 15.8 (*)    Neutro Abs 19.5 (*)    All other components within normal limits  BASIC METABOLIC PANEL - Abnormal; Notable for the following components:   Sodium 134 (*)    All other components within normal limits  URINALYSIS, ROUTINE W REFLEX MICROSCOPIC    EKG EKG Interpretation  Date/Time:  Monday September 19 2019 14:25:30 EDT Ventricular Rate:  80 PR Interval:    QRS Duration: 93 QT Interval:  378 QTC Calculation: 436 R Axis:   52 Text Interpretation: Sinus rhythm Low voltage, precordial leads Confirmed by Virgel Manifold [phone]) on 09/19/2019 3:04:42 PM   Radiology DG Chest Port 1 View  Result Date: 09/19/2019 CLINICAL DATA:  Pt c/o weakness since yesterday and increased pain and dizziness this morning; pt reports she is a cancer patient; last had Chemo 09/15/19 EXAM: PORTABLE CHEST 1 VIEW COMPARISON:  09/08/2019 FINDINGS: Cardiac silhouette is normal in size. No mediastinal or hilar masses or evidence of adenopathy. Clear lungs.  No convincing pleural effusion and no pneumothorax. Skeletal structures are grossly intact. IMPRESSION: No active disease. Electronically Signed   By: Lajean Manes M.D.   On: 09/19/2019 19:18    Procedures Procedures (including critical care time)  Medications Ordered in ED Medications  LORazepam (ATIVAN) tablet 1 mg (1 mg Oral Given 09/19/19 1449)    ED Course  I have reviewed the triage vital signs and the nursing notes.  Pertinent labs & imaging results that were available during my care of the patient were reviewed by me and considered in my medical decision making (see chart for  details).    MDM Rules/Calculators/A&P                          73yF with restlessness? She complained of restlessness in oncology progress note in July. Symptoms now much better "after a good cry." Certainly could be anxiety related. She has understandably been under a lot of stress with her cancer/treatment. Exam is reassuring. She does have a leukocytosis. Afebrile. No overt infectious symptoms. I suspect this is related to neulasta injection she received during last chemo cycle a few days ago.   Final Clinical Impression (s) / ED Diagnoses Final diagnoses:  Restlessness    Rx / DC Orders ED Discharge Orders    None       Virgel Manifold, MD 09/21/19 [phone]

## 2019-09-19 NOTE — ED Notes (Signed)
Pt in bed, pt states that she is feeling much better.

## 2019-09-19 NOTE — ED Triage Notes (Signed)
Pt c/o weakness since yesterday and increased pain and dizziness this morning; pt reports she is a cancer patient; last had Chemo 09/15/19

## 2019-09-20 ENCOUNTER — Encounter (HOSPITAL_COMMUNITY): Payer: Self-pay

## 2019-09-20 ENCOUNTER — Other Ambulatory Visit (HOSPITAL_COMMUNITY): Payer: Self-pay

## 2019-09-20 MED ORDER — TEMAZEPAM 15 MG PO CAPS: 15.0000 mg | ORAL_CAPSULE | Freq: Every evening | ORAL | 0 refills | Status: DC | PRN

## 2019-09-20 NOTE — Progress Notes (Signed)
I returned the patient's phone call regarding restlessness and decreased appetite. Patient reports she feels much better today after a visit to the emergency department yesterday. Patient reports that she has been able to eat today as well.

## 2019-09-21 ENCOUNTER — Other Ambulatory Visit (HOSPITAL_COMMUNITY): Payer: Self-pay | Admitting: *Deleted

## 2019-09-21 MED ORDER — TEMAZEPAM 15 MG PO CAPS: 15.0000 mg | ORAL_CAPSULE | Freq: Every evening | ORAL | 5 refills | Status: DC | PRN

## 2019-09-22 ENCOUNTER — Encounter (HOSPITAL_COMMUNITY): Payer: Self-pay

## 2019-09-22 ENCOUNTER — Other Ambulatory Visit (HOSPITAL_COMMUNITY): Payer: Self-pay

## 2019-09-22 NOTE — Progress Notes (Signed)
I called this patient today for a brief check in. Patient reports continued inability to sleep and reports that she feels that this is causing her to become increasingly anxious. Patient reports that she will pick up the medication prescribed to assist with sleep today and begin taking it tonight. I encouraged the patient to utilize the medication as prescribed and call the clinic back to report how she feels once she has started taking the medication. Patient reports some nausea, though she reports that the prescribed compazine relieves that and allows her to eat and drink. I encourage the patient to utilize the medication prescribed by Dr. Delton Coombes and to call the clinic with any continued concerns.

## 2019-09-30 ENCOUNTER — Ambulatory Visit (HOSPITAL_COMMUNITY): Payer: Medicare Other

## 2019-09-30 ENCOUNTER — Ambulatory Visit: Payer: Medicare Other | Admitting: Internal Medicine

## 2019-09-30 NOTE — Progress Notes (Signed)
Nutrition Follow-up:  Patient with ovarian cancer.  Patient is receiving neoadjuvant chemotherapy with plans for possible surgery.   Spoke with patient via phone for nutrition follow-up.  Patient reports that she has not been doing well for the last month.  Reports that she had a bad cold that effected appetite. Also food tasted like syrup which effected intake.  Also had issues with reflux (especially after eating bean burrito from [facility]), unable to sleep.  Has been drinking boost once per day. Eggs and bacon is tasting good at this time for breakfast along with peanut butter nabs.    Medications: reviewed  Labs: reviewed  Anthropometrics:   Weight 188 lb on 9/6 decreased from 192 lb on 7/22   NUTRITION DIAGNOSIS: Inadequate oral intake related to cancer related treatment side effects as evidenced by weight loss and poor appetite   INTERVENTION:  Discussed strategies to help with taste change.  Will mail handout Discussed strategies to help with GERD Patient with concerns regarding experiences had at the cancer center and have forwarded them to [place] currently covering [facility].   Patient has contact information    MONITORING, EVALUATION, GOAL: weight trends, intake   NEXT VISIT: October 8 phone f/u  Laura [name] B. Zenia Resides, [place], [place] Registered Dietitian [phone] (mobile)

## 2019-10-04 ENCOUNTER — Inpatient Hospital Stay (HOSPITAL_COMMUNITY): Payer: Medicare Other | Admitting: General Practice

## 2019-10-04 ENCOUNTER — Ambulatory Visit (HOSPITAL_COMMUNITY)
Admission: RE | Admit: 2019-10-04 | Discharge: 2019-10-04 | Disposition: A | Discharge status: Home / Self Care | Payer: Medicare Other | Source: Ambulatory Visit | Attending: Hematology | Admitting: Hematology

## 2019-10-04 ENCOUNTER — Other Ambulatory Visit: Payer: Self-pay

## 2019-10-04 DIAGNOSIS — R590 Localized enlarged lymph nodes: Secondary | ICD-10-CM | POA: Diagnosis not present

## 2019-10-04 DIAGNOSIS — N1330 Unspecified hydronephrosis: Secondary | ICD-10-CM | POA: Diagnosis not present

## 2019-10-04 DIAGNOSIS — N8110 Cystocele, unspecified: Secondary | ICD-10-CM | POA: Diagnosis not present

## 2019-10-04 DIAGNOSIS — C569 Malignant neoplasm of unspecified ovary: Secondary | ICD-10-CM

## 2019-10-04 DIAGNOSIS — I700 Atherosclerosis of aorta: Secondary | ICD-10-CM | POA: Diagnosis not present

## 2019-10-04 DIAGNOSIS — K449 Diaphragmatic hernia without obstruction or gangrene: Secondary | ICD-10-CM | POA: Diagnosis not present

## 2019-10-04 MED ORDER — IOHEXOL 300 MG/ML  SOLN
100.0000 mL | Freq: Once | INTRAMUSCULAR | Status: AC | PRN
  Administered 2019-10-04: 100 mL via INTRAVENOUS

## 2019-10-04 NOTE — Progress Notes (Signed)
[facility] CSW Progress Notes  Call to patient at request of nurse navigator, patient wants to be linked w a therapist.  Patient reports that "things are better now."  Felt like she did not get sufficient support during treatment, did not get calls back on nights/weekends/holidays.  Felt like she was told she could be helped/advised 24/7 when she had problems while in treatment.  Reported instance when she had a severe cold and did not feel like she got a timely response from staff in terms of how to deal w her cold.  "Felt like I was losing my mind, I might as well be dead." Has been having difficulty sleeping - does not feel current sleeping medication prescribed has helped her sleep.  Is currently "very weak", has not been able to eat well until recently.  Cold symptoms have mostly resolved except for cough that wakes her up at night.    Has support from church family in [place] and other friends.  Would get calls, but did not want to call them because she was 'too exhausted, I was worrying myself sick."  She didn't want to talk w people at that time.    My biggest problem is "wondering what is happening to me."  Also voices feeling of lack of support from [facility] staff while she was suffering from a severe cold.  She does not feel that she is "depressed, I feel like Im justified in feeling bad, I have a reason for feeling like this."  For "past month, I have only been able to do light duty around the house."  "Can barely spread the comforter over the bed, I can barely straighten it out."  Is panting after making up the bed, walking across the yard.  CSW discussed options for additional support including [place], [place] and [facility] (provides peer mentor program).  Email sent to patient with these resources, will follow up in two weeks by phone.  CSW will also convey patient concerns about perception of lack of  support/timely follow up by [facility] providers.    Edwyna Shell, LCSW Clinical Social Worker Phone:  [phone]

## 2019-10-06 ENCOUNTER — Telehealth: Payer: Self-pay | Admitting: *Deleted

## 2019-10-06 ENCOUNTER — Inpatient Hospital Stay (HOSPITAL_BASED_OUTPATIENT_CLINIC_OR_DEPARTMENT_OTHER): Payer: Medicare Other | Admitting: Hematology

## 2019-10-06 ENCOUNTER — Inpatient Hospital Stay (HOSPITAL_COMMUNITY): Payer: Medicare Other

## 2019-10-06 ENCOUNTER — Other Ambulatory Visit: Payer: Self-pay

## 2019-10-06 VITALS — BP 154/61 | HR 90 | Temp 96.7°F | Resp 18 | Wt 184.8 lb

## 2019-10-06 DIAGNOSIS — G4730 Sleep apnea, unspecified: Secondary | ICD-10-CM | POA: Diagnosis not present

## 2019-10-06 DIAGNOSIS — C569 Malignant neoplasm of unspecified ovary: Secondary | ICD-10-CM

## 2019-10-06 DIAGNOSIS — Z5189 Encounter for other specified aftercare: Secondary | ICD-10-CM | POA: Diagnosis not present

## 2019-10-06 DIAGNOSIS — Z9989 Dependence on other enabling machines and devices: Secondary | ICD-10-CM | POA: Diagnosis not present

## 2019-10-06 DIAGNOSIS — R531 Weakness: Secondary | ICD-10-CM | POA: Diagnosis not present

## 2019-10-06 DIAGNOSIS — E119 Type 2 diabetes mellitus without complications: Secondary | ICD-10-CM | POA: Diagnosis not present

## 2019-10-06 DIAGNOSIS — K219 Gastro-esophageal reflux disease without esophagitis: Secondary | ICD-10-CM | POA: Diagnosis not present

## 2019-10-06 DIAGNOSIS — Z5111 Encounter for antineoplastic chemotherapy: Secondary | ICD-10-CM | POA: Diagnosis not present

## 2019-10-06 DIAGNOSIS — Z95828 Presence of other vascular implants and grafts: Secondary | ICD-10-CM

## 2019-10-06 DIAGNOSIS — R600 Localized edema: Secondary | ICD-10-CM | POA: Diagnosis not present

## 2019-10-06 DIAGNOSIS — E860 Dehydration: Secondary | ICD-10-CM | POA: Insufficient documentation

## 2019-10-06 DIAGNOSIS — E785 Hyperlipidemia, unspecified: Secondary | ICD-10-CM | POA: Diagnosis not present

## 2019-10-06 DIAGNOSIS — N3289 Other specified disorders of bladder: Secondary | ICD-10-CM | POA: Diagnosis not present

## 2019-10-06 DIAGNOSIS — I10 Essential (primary) hypertension: Secondary | ICD-10-CM | POA: Diagnosis not present

## 2019-10-06 DIAGNOSIS — Z79899 Other long term (current) drug therapy: Secondary | ICD-10-CM | POA: Diagnosis not present

## 2019-10-06 DIAGNOSIS — M545 Low back pain: Secondary | ICD-10-CM | POA: Diagnosis not present

## 2019-10-06 DIAGNOSIS — M1990 Unspecified osteoarthritis, unspecified site: Secondary | ICD-10-CM | POA: Diagnosis not present

## 2019-10-06 DIAGNOSIS — R5383 Other fatigue: Secondary | ICD-10-CM | POA: Diagnosis not present

## 2019-10-06 LAB — COMPREHENSIVE METABOLIC PANEL
ALT: 16 U/L (ref 0–44)
AST: 17 U/L (ref 15–41)
Albumin: 4.2 g/dL (ref 3.5–5.0)
Alkaline Phosphatase: 60 U/L (ref 38–126)
Anion gap: 9 (ref 5–15)
BUN: 20 mg/dL (ref 8–23)
CO2: 24 mmol/L (ref 22–32)
Calcium: 9.8 mg/dL (ref 8.9–10.3)
Chloride: 103 mmol/L (ref 98–111)
Creatinine, Ser: 1.1 mg/dL — ABNORMAL HIGH (ref 0.44–1.00)
GFR calc Af Amer: 57 mL/min — ABNORMAL LOW (ref >60)
GFR calc non Af Amer: 49 mL/min — ABNORMAL LOW (ref >60)
Glucose, Bld: 172 mg/dL — ABNORMAL HIGH (ref 70–99)
Potassium: 3.6 mmol/L (ref 3.5–5.1)
Sodium: 136 mmol/L (ref 135–145)
Total Bilirubin: 0.6 mg/dL (ref 0.3–1.2)
Total Protein: 7.1 g/dL (ref 6.5–8.1)

## 2019-10-06 LAB — CBC WITH DIFFERENTIAL/PLATELET
Abs Immature Granulocytes: 0.02 10*3/uL (ref 0.00–0.07)
Basophils Absolute: 0 10*3/uL (ref 0.0–0.1)
Basophils Relative: 0 %
Eosinophils Absolute: 0 10*3/uL (ref 0.0–0.5)
Eosinophils Relative: 1 %
HCT: 32.1 % — ABNORMAL LOW (ref 36.0–46.0)
Hemoglobin: 10.7 g/dL — ABNORMAL LOW (ref 12.0–15.0)
Immature Granulocytes: 0 %
Lymphocytes Relative: 31 %
Lymphs Abs: 1.9 10*3/uL (ref 0.7–4.0)
MCH: 30.1 pg (ref 26.0–34.0)
MCHC: 33.3 g/dL (ref 30.0–36.0)
MCV: 90.2 fL (ref 80.0–100.0)
Monocytes Absolute: 0.5 10*3/uL (ref 0.1–1.0)
Monocytes Relative: 9 %
Neutro Abs: 3.6 10*3/uL (ref 1.7–7.7)
Neutrophils Relative %: 59 %
Platelets: 178 10*3/uL (ref 150–400)
RBC: 3.56 MIL/uL — ABNORMAL LOW (ref 3.87–5.11)
RDW: 16.2 % — ABNORMAL HIGH (ref 11.5–15.5)
WBC: 6 10*3/uL (ref 4.0–10.5)
nRBC: 0 % (ref 0.0–0.2)

## 2019-10-06 MED ORDER — SODIUM CHLORIDE 0.9 % IV SOLN
140.0000 mg/m2 | Freq: Once | INTRAVENOUS | Status: AC
  Administered 2019-10-06: 282 mg via INTRAVENOUS
  Filled 2019-10-06: qty 47

## 2019-10-06 MED ORDER — SODIUM CHLORIDE 0.9 % IV SOLN
150.0000 mg | Freq: Once | INTRAVENOUS | Status: AC
  Administered 2019-10-06: 150 mg via INTRAVENOUS
  Filled 2019-10-06: qty 150

## 2019-10-06 MED ORDER — SODIUM CHLORIDE 0.9% FLUSH
10.0000 mL | INTRAVENOUS | Status: DC | PRN
  Administered 2019-10-06: 10 mL

## 2019-10-06 MED ORDER — SODIUM CHLORIDE 0.9 % IV SOLN
10.0000 mg | Freq: Once | INTRAVENOUS | Status: AC
  Administered 2019-10-06: 10 mg via INTRAVENOUS
  Filled 2019-10-06: qty 10

## 2019-10-06 MED ORDER — FAMOTIDINE IN NACL 20-0.9 MG/50ML-% IV SOLN
20.0000 mg | Freq: Once | INTRAVENOUS | Status: AC
  Administered 2019-10-06: 20 mg via INTRAVENOUS
  Filled 2019-10-06: qty 50

## 2019-10-06 MED ORDER — SODIUM CHLORIDE 0.9 % IV SOLN: Freq: Once | INTRAVENOUS | Status: AC

## 2019-10-06 MED ORDER — SODIUM CHLORIDE 0.9 % IV SOLN
460.0000 mg | Freq: Once | INTRAVENOUS | Status: AC
  Administered 2019-10-06: 460 mg via INTRAVENOUS
  Filled 2019-10-06: qty 46

## 2019-10-06 MED ORDER — DIPHENHYDRAMINE HCL 50 MG/ML IJ SOLN
50.0000 mg | Freq: Once | INTRAMUSCULAR | Status: AC
  Administered 2019-10-06: 50 mg via INTRAVENOUS
  Filled 2019-10-06: qty 1

## 2019-10-06 MED ORDER — PALONOSETRON HCL INJECTION 0.25 MG/5ML
0.2500 mg | Freq: Once | INTRAVENOUS | Status: AC
  Administered 2019-10-06: 0.25 mg via INTRAVENOUS
  Filled 2019-10-06: qty 5

## 2019-10-06 MED ORDER — HEPARIN SOD (PORK) LOCK FLUSH 100 UNIT/ML IV SOLN
500.0000 [IU] | Freq: Once | INTRAVENOUS | Status: AC | PRN
  Administered 2019-10-06: 500 [IU]

## 2019-10-06 MED ORDER — PEGFILGRASTIM 6 MG/0.6ML ~~LOC~~ PSKT
6.0000 mg | PREFILLED_SYRINGE | Freq: Once | SUBCUTANEOUS | Status: AC
  Administered 2019-10-06: 6 mg via SUBCUTANEOUS
  Filled 2019-10-06: qty 0.6

## 2019-10-06 MED ORDER — SODIUM CHLORIDE 0.9 % IV SOLN
Freq: Once | INTRAVENOUS | Status: AC
  Filled 2019-10-06: qty 1000

## 2019-10-06 NOTE — Progress Notes (Signed)
[place], [place] [address]   CLINIC:  Medical Oncology/Hematology  PCP:  Biagio Borg, MD [place] / [place] [address] [phone]   REASON FOR VISIT:  Follow-up for ovarian cancer  PRIOR THERAPY: None  NGS Results: Foundation 1 MS--stable  CURRENT THERAPY: Carboplatin and paclitaxel every 3 weeks  BRIEF ONCOLOGIC HISTORY:  Oncology History  Primary ovarian adenocarcinoma, unspecified laterality ([facility])  06/20/2019 Initial Diagnosis   Primary ovarian adenocarcinoma, unspecified laterality ([facility])   07/01/2019 Genetic Testing   Foundation One     07/11/2019 Genetic Testing   Negative genetic testing:  No pathogenic variants detected on the [facility] Multi-Cancer Panel. The report date is 07/11/2019.  The Multi-Cancer Panel offered by [facility] includes sequencing and/or deletion duplication testing of the following 85 genes: AIP, ALK, APC, ATM, AXIN2,BAP1,  BARD1, BLM, BMPR1A, BRCA1, BRCA2, BRIP1, CASR, CDC73, CDH1, CDK4, CDKN1B, CDKN1C, CDKN2A (p14ARF), CDKN2A (p16INK4a), CEBPA, CHEK2, CTNNA1, DICER1, DIS3L2, EGFR (c.2369C>T, p.Thr790Met variant only), EPCAM (Deletion/duplication testing only), FH, FLCN, GATA2, GPC3, GREM1 (Promoter region deletion/duplication testing only), HOXB13 (c.251G>A, p.Gly84Glu), HRAS, KIT, MAX, MEN1, MET, MITF (c.952G>A, p.Glu318Lys variant only), MLH1, MSH2, MSH3, MSH6, MUTYH, NBN, NF1, NF2, NTHL1, PALB2, PDGFRA, PHOX2B, PMS2, POLD1, POLE, POT1, PRKAR1A, PTCH1, PTEN, RAD50, RAD51C, RAD51D, RB1, RECQL4, RET, RNF43, RUNX1, SDHAF2, SDHA (sequence changes only), SDHB, SDHC, SDHD, SMAD4, SMARCA4, SMARCB1, SMARCE1, STK11, SUFU, TERC, TERT, TMEM127, TP53, TSC1, TSC2, VHL, WRN and WT1.   07/28/2019 -  Chemotherapy   The patient had palonosetron (ALOXI) injection 0.25 mg, 0.25 mg, Intravenous,  Once, 3 of 6 cycles Administration: 0.25 mg (07/28/2019), 0.25 mg (08/18/2019), 0.25 mg (09/15/2019) pegfilgrastim (NEULASTA  ONPRO KIT) injection 6 mg, 6 mg, Subcutaneous, Once, 3 of 6 cycles Administration: 6 mg (07/28/2019), 6 mg (08/18/2019) CARBOplatin (PARAPLATIN) 470 mg in sodium chloride 0.9 % 250 mL chemo infusion, 470 mg (100 % of original dose 470 mg), Intravenous,  Once, 3 of 6 cycles Dose modification:   (original dose 470 mg, Cycle 1, Reason: Provider Judgment),   (original dose 568.2 mg, Cycle 2),   (original dose 460.5 mg, Cycle 3) Administration: 470 mg (07/28/2019), 570 mg (08/18/2019) fosaprepitant (EMEND) 150 mg in sodium chloride 0.9 % 145 mL IVPB, 150 mg, Intravenous,  Once, 3 of 6 cycles Administration: 150 mg (07/28/2019), 150 mg (08/18/2019), 150 mg (09/15/2019) PACLitaxel (TAXOL) 282 mg in sodium chloride 0.9 % 250 mL chemo infusion (> 50m/m2), 140 mg/m2 = 282 mg (80 % of original dose 175 mg/m2), Intravenous,  Once, 3 of 6 cycles Dose modification: 140 mg/m2 (80 % of original dose 175 mg/m2, Cycle 1, Reason: Provider Judgment), 150 mg/m2 (original dose 175 mg/m2, Cycle 2, Reason: Provider Judgment), 140 mg/m2 (original dose 175 mg/m2, Cycle 3, Reason: Provider Judgment) Administration: 282 mg (07/28/2019), 300 mg (08/18/2019), 282 mg (09/15/2019)  for chemotherapy treatment.      CANCER STAGING: Cancer Staging No matching staging information was found for the patient.  INTERVAL HISTORY:  Laura [name] a 74y.o. female, returns for routine follow-up and consideration for next cycle of chemotherapy. Laura [name] last seen on 09/15/2019.  Due for cycle #4 of carboplatin and paclitaxel today.   Overall, she tells me she has been feeling bad. She complains of being weak for the last 2.5 weeks and keeps being restless when she sleeps. The Restoril is not helping her sleep, though she reports that Ativan helped her sleep more. The Vesicare is helping with her  bladder spasms. She reports that her appetite is down because every time she eats the food tastes like syrup. She has not required morphine for the past 2  months. She continues having numbness and tingling in her toes and right hand, though it is slightly better. She denies having abdominal pain. She is drinking plenty of fluids.  Overall, she feels ready for next cycle of chemo today.    REVIEW OF SYSTEMS:  Review of Systems  Constitutional: Positive for appetite change (mildly decreased) and fatigue (depleted).  Respiratory: Positive for cough.   Gastrointestinal: Negative for abdominal pain.  Neurological: Positive for extremity weakness and numbness (R hand & toes).  Psychiatric/Behavioral: Positive for sleep disturbance (restless).  All other systems reviewed and are negative.   PAST MEDICAL/SURGICAL HISTORY:  Past Medical History:  Diagnosis Date  . Anxiety   . Arthritis    back of neck, bones spurs on neck  . Cataract   . Cervical disc disease   . Diabetes mellitus   . Family history of thyroid cancer   . Hyperlipidemia   . Hypertension   . Mucoid cyst of joint    right thumb  . Neuropathy   . Port-A-Cath in place 07/21/2019  . Reflux   . Sleep apnea    wears CPAP nightly  . Vertigo    Past Surgical History:  Procedure Laterality Date  . BLADDER SURGERY    . BREAST EXCISIONAL BIOPSY Bilateral   . BREAST SURGERY    . CHOLECYSTECTOMY    . fibroids removed     breast (both breasts)  . IR IMAGING GUIDED PORT INSERTION  07/26/2019  . MASS EXCISION Right 06/26/2016   Procedure: EXCISION MUCOID TUMOR RIGHT THUMB, IP RIGHT THUMB;  Surgeon: Daryll Brod, MD;  Location: [facility];  Service: Orthopedics;  Laterality: Right;  . PARTIAL HYSTERECTOMY      SOCIAL HISTORY:  Social History   Socioeconomic History  . Marital status: Divorced    Spouse name: Not on file  . Number of children: 4  . Years of education: 17  . Highest education level: Not on file  Occupational History  . Occupation: retired Geographical information systems officer  Tobacco Use  . Smoking status: Never Smoker  . Smokeless tobacco: Never Used    Vaping Use  . Vaping Use: Never used  Substance and Sexual Activity  . Alcohol use: No    Alcohol/week: 0.0 standard drinks  . Drug use: No  . Sexual activity: Not Currently  Other Topics Concern  . Not on file  Social History Narrative  . Not on file   Social Determinants of Health   Financial Resource Strain: Low Risk   . Difficulty of Paying Living Expenses: Not hard at all  Food Insecurity: No Food Insecurity  . Worried About Charity fundraiser in the Last Year: Never true  . Ran Out of Food in the Last Year: Never true  Transportation Needs: No Transportation Needs  . Lack of Transportation (Medical): No  . Lack of Transportation (Non-Medical): No  Physical Activity: Inactive  . Days of Exercise per Week: 0 days  . Minutes of Exercise per Session: 0 min  Stress: No Stress Concern Present  . Feeling of Stress : Not at all  Social Connections: Moderately Integrated  . Frequency of Communication with Friends and Family: More than three times a week  . Frequency of Social Gatherings with Friends and Family: More than three times a week  .  Attends Religious Services: More than 4 times per year  . Active Member of Clubs or Organizations: Yes  . Attends Archivist Meetings: Never  . Marital Status: Divorced  Human resources officer Violence: Not At Risk  . Fear of Current or Ex-Partner: No  . Emotionally Abused: No  . Physically Abused: No  . Sexually Abused: No    FAMILY HISTORY:  Family History  Problem Relation Age of Onset  . Hypertension Other   . Diabetes Father   . Hypertension Father   . Diabetes Sister   . Hypertension Mother   . Hypertension Sister   . Stroke Sister   . Diabetes Maternal Aunt   . Thyroid cancer Daughter 39  . Colon cancer Neg Hx   . Esophageal cancer Neg Hx   . Stomach cancer Neg Hx   . Rectal cancer Neg Hx   . Breast cancer Neg Hx   . Endometrial cancer Neg Hx   . Ovarian cancer Neg Hx     CURRENT MEDICATIONS:  Current  Outpatient Medications  Medication Sig Dispense Refill  . Dextromethorphan-guaiFENesin (DELSYM COUGH/CHEST CONGEST DM) 5-100 MG/5ML LIQD     . acetaminophen (TYLENOL) 500 MG tablet Take 500 mg by mouth 2 (two) times daily as needed for moderate pain or headache.  (Patient not taking: Reported on 09/15/2019)    . amLODipine (NORVASC) 5 MG tablet TAKE ONE (1) TABLET BY MOUTH EVERY DAY (Patient taking differently: Take 5 mg by mouth daily. ) 90 tablet 1  . Blood Glucose Monitoring Suppl (ONE TOUCH ULTRA 2) w/Device KIT Use as directed 1 each 0  . CARBOPLATIN IV Inject into the vein every 21 ( twenty-one) days.    . cetirizine (ZYRTEC) 10 MG tablet Take 10 mg by mouth daily.    . cholecalciferol (VITAMIN D3) 25 MCG (1000 UNIT) tablet Take 2,000 Units by mouth daily.    . ciprofloxacin (CIPRO) 500 MG tablet Take 1 tablet (500 mg total) by mouth 2 (two) times daily. 10 tablet 0  . furosemide (LASIX) 20 MG tablet Take 1 tablet (20 mg total) by mouth daily. 30 tablet 2  . glimepiride (AMARYL) 2 MG tablet TAKE ONE TABLET BY MOUTH DAILY 90 tablet 3  . Lactulose 20 GM/30ML SOLN Take 30 mLs (20 g total) by mouth at bedtime. (Patient not taking: Reported on 07/20/2019) 480 mL 3  . Lancets (ONETOUCH ULTRASOFT) lancets USE TO CHECK BLOOD SUGAR TWICE DAILY 100 each 3  . lidocaine-prilocaine (EMLA) cream Apply a small amount to port a cath site and cover with plastic wrap 1 hour prior to chemotherapy appointments 30 g 3  . loperamide (IMODIUM) 2 MG capsule Take by mouth as needed for diarrhea or loose stools.     Marland Kitchen losartan-hydrochlorothiazide (HYZAAR) 100-12.5 MG tablet Take 1 tablet by mouth daily. 90 tablet 3  . morphine (MSIR) 15 MG tablet Take 1 tablet (15 mg total) by mouth 2 (two) times daily as needed for severe pain. 30 tablet 0  . ONETOUCH ULTRA test strip USE TO CHECK BLOOD SUGARS TWO TIMES DAILY 100 strip 3  . OVER THE COUNTER MEDICATION Apply 1 application topically daily as needed (pain). Triderma otc  pain cream     . PACLITAXEL IV Inject 175 mg/m2 into the vein every 21 ( twenty-one) days.    . pantoprazole (PROTONIX) 40 MG tablet TAKE 1 TABLET (40 MG TOTAL) BY MOUTH DAILY. (Patient not taking: Reported on 08/04/2019) 90 tablet 1  . Pegfilgrastim (NEULASTA ONPRO  [place]) Inject into the skin every 21 ( twenty-one) days.    . phenylephrine (SUDAFED PE) 10 MG TABS tablet     . Phenylephrine-APAP-guaiFENesin (MUCINEX SINUS-MAX) 10-650-400 MG/20ML LIQD     . Phenylephrine-DM-GG (TUSSIN CF MAX MULTI-SYMPTOM) 5-10-200 MG/5ML LIQD     . prochlorperazine (COMPAZINE) 10 MG tablet Take 1 tablet (10 mg total) by mouth every 6 (six) hours as needed (Nausea or vomiting). 30 tablet 1  . sennosides-docusate sodium (SENOKOT-S) 8.6-50 MG tablet Take 2 tablets by mouth daily.    . temazepam (RESTORIL) 15 MG capsule Take 1 capsule (15 mg total) by mouth at bedtime as needed for sleep. 30 capsule 5  . vitamin B-12 (CYANOCOBALAMIN) 1000 MCG tablet Take 2,000 mcg by mouth daily.     No current facility-administered medications for this visit.    ALLERGIES:  Allergies  Allergen Reactions  . Hydrocodone-Homatropine Other (See Comments)    Vertigo *pt strongly prefers to never take*  . Sulfa Antibiotics Hives, Itching and Swelling    Tongue swells  . Augmentin [Amoxicillin-Pot Clavulanate]     Diarrhea; can take PCN/ Amoxicillin  . Codeine Itching  . Crestor [Rosuvastatin Calcium] Other (See Comments)    Did something to memory    . Doxycycline Other (See Comments)    Severe rectal Gas.  . Gabapentin     disoriented  . Keflex [Cephalexin] Diarrhea and Nausea And Vomiting  . Naproxen Other (See Comments)    Stomach cramps  . Statins     Muscle weakness  . Prednisone Anxiety    *pt strongly prefers to never be given prednisone*     PHYSICAL EXAM:  Performance status (ECOG): 1 - Symptomatic but completely ambulatory  Vitals:   10/06/19 0800  BP: (!) 154/61  Pulse: 90  Resp: 18  Temp: (!) 96.7 F  (35.9 C)  SpO2: 99%   Wt Readings from Last 3 Encounters:  10/06/19 184 lb 12.8 oz (83.8 kg)  09/19/19 188 lb (85.3 kg)  09/15/19 188 lb (85.3 kg)   Physical Exam Vitals reviewed.  Constitutional:      Appearance: Normal appearance.  Cardiovascular:     Rate and Rhythm: Normal rate and regular rhythm.     Pulses: Normal pulses.     Heart sounds: Normal heart sounds.  Pulmonary:     Effort: Pulmonary effort is normal.     Breath sounds: Normal breath sounds.  Chest:     Comments: Port-a-Cath in R chest Abdominal:     Palpations: Abdomen is soft. There is no mass.     Tenderness: There is no abdominal tenderness.  Neurological:     General: No focal deficit present.     Mental Status: She is alert and oriented to person, place, and time.  Psychiatric:        Mood and Affect: Mood normal.        Behavior: Behavior normal.     LABORATORY DATA:   I have reviewed the labs as listed.  CBC Latest Ref Rng & Units 10/06/2019 09/19/2019 09/15/2019  WBC 4.0 - 10.5 K/uL 6.0 21.9(H) 5.6  Hemoglobin 12.0 - 15.0 g/dL 10.7(L) 11.0(L) 10.7(L)  Hematocrit 36 - 46 % 32.1(L) 33.3(L) 31.9(L)  Platelets 150 - 400 K/uL 178 187 195   CMP Latest Ref Rng & Units 10/06/2019 09/19/2019 09/15/2019  Glucose 70 - 99 mg/dL 172(H) 85 168(H)  BUN 8 - 23 mg/dL _0 Creatinine 0.44 - 1.00 mg/dL 1.10(H) 0.87 1.04(H)  Sodium  135 - 145 mmol/L 136 134(L) 131(L)  Potassium 3.5 - 5.1 mmol/L 3.6 4.3 3.6  Chloride 98 - 111 mmol/L 103 100 99  CO2 22 - 32 mmol/L _0 Calcium 8.9 - 10.3 mg/dL 9.8 9.8 9.5  Total Protein 6.5 - 8.1 g/dL 7.1 - 7.2  Total Bilirubin 0.3 - 1.2 mg/dL 0.6 - 0.7  Alkaline Phos 38 - 126 U/L 60 - 58  AST 15 - 41 U/L 17 - 18  ALT 0 - 44 U/L 16 - 14  No results found for: CA125  DIAGNOSTIC IMAGING:  I have independently reviewed the scans and discussed with the patient. DG Chest 2 View  Result Date: 09/08/2019 CLINICAL DATA:  Cough.  Chemotherapy EXAM: CHEST - 2 VIEW COMPARISON:   Radiograph 02/23/2017 FINDINGS: Normal cardiac silhouette. Port in the anterior chest wall with tip in distal SVC. Mild interval increase in mild interstitial markings. Mild lower lobe predominance. No pleural fluid. IMPRESSION: Mild increase in interstitial markings in lower lobes could represent infection or edema. Electronically Signed   By: Suzy Bouchard M.D.   On: 09/08/2019 10:12   CT Abdomen Pelvis W Contrast  Result Date: 10/04/2019 CLINICAL DATA:  Follow-up ovarian carcinoma. Currently undergoing chemotherapy. EXAM: CT ABDOMEN AND PELVIS WITH CONTRAST TECHNIQUE: Multidetector CT imaging of the abdomen and pelvis was performed using the standard protocol following bolus administration of intravenous contrast. CONTRAST:  137m OMNIPAQUE IOHEXOL 300 MG/ML  SOLN COMPARISON:  04/02/2019 FINDINGS: Lower Chest: No acute findings. Hepatobiliary: No hepatic masses identified. Prior cholecystectomy. No evidence of biliary obstruction. Pancreas:  No mass or inflammatory changes. Spleen: Within normal limits in size and appearance. Adrenals/Urinary Tract: No masses identified. There has been resolution right hydroureteronephrosis since prior study. Pelvic floor laxity with cystocele again noted. Stomach/Bowel: Small hiatal hernia again seen. No evidence of obstruction, inflammatory process or abnormal fluid collections. Vascular/Lymphatic: Retroperitoneal lymphadenopathy in the lower aortocaval space at the level of the bifurcations shows decreased enhancement and is mildly decreased in size, currently measuring 5.2 x 2.9 cm on image 42/2, compared to 6.0 x 4.3 cm previously. Low-attenuation retroperitoneal lymphadenopathy in the left paraaortic region shows mild increase in size, with index node measuring 5.0 x 4.4 cm on image 28/2, compared to 4.5 x 3.7 cm when remeasured at same location on prior exam. No new sites of lymphadenopathy identified. Aortic atherosclerosis noted. No abdominal aortic aneurysm.  Reproductive: Prior hysterectomy again noted. Adnexal regions are unremarkable. No evidence of ascites. Other:  None. Musculoskeletal:  No suspicious bone lesions identified. IMPRESSION: Mixed response of retroperitoneal lymphadenopathy, which has decreased in the aorto-caval space, but shows mild increase in the left paraaortic region. No new sites of metastatic disease identified. Resolution of right hydronephrosis since previous study. Small hiatal hernia. Pelvic floor laxity with cystocele. Aortic Atherosclerosis (ICD10-I70.0). Electronically Signed   By: JMarlaine HindM.D.   On: 10/04/2019 14:18   DG Chest Port 1 View  Result Date: 09/19/2019 CLINICAL DATA:  Pt c/o weakness since yesterday and increased pain and dizziness this morning; pt reports she is a cancer patient; last had Chemo 09/15/19 EXAM: PORTABLE CHEST 1 VIEW COMPARISON:  09/08/2019 FINDINGS: Cardiac silhouette is normal in size. No mediastinal or hilar masses or evidence of adenopathy. Clear lungs.  No convincing pleural effusion and no pneumothorax. Skeletal structures are grossly intact. IMPRESSION: No active disease. Electronically Signed   By: DLajean ManesM.D.   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 Mass measures 6 x 5.8 cm and is partially calcified. Separate solid component superior to this in the left periaortic region measures 3.5 cm, SUV 14.3. Cystic component medial to the lower pole of the left kidney is without hypermetabolic activity, possibly a lymphocele. -CT-guided biopsy of the right retroperitoneal lymph node consistent with adenocarcinoma with psammoma bodies. Morphology and immunotherapy consistent with ovarian serous carcinoma/primary peritoneal carcinoma. -Germline mutation testing was negative. -Foundation 1 testing shows MS-stable.  Loss of heterozygosity was less than 16%. -She met with Dr. Denman George who recommended neoadjuvant chemotherapywith 3-6 cycles of carboplatin and paclitaxel with interim staging after 3 cycles. If there is substantial response unless encasement of the aorta and vena cava, consideration for debulking procedure can be possible at that time. -CA-125 346 on 06/23/2019. -Cycle 1 of carboplatin and paclitaxel on 07/28/2019. -CTAP on 10/04/2019 after 3 cycles of chemotherapy showed retroperitoneal adenopathy at the bifurcation measuring 5.2 x 2.9 cm, left para-aortic lymphadenopathy measuring 5 x 4.4 cm, both of them decreased in size when compared to most recent PET scan.  CT scan report says that one of the lesion has gotten bigger but this was compared to CT scan from 04/02/2019.   PLAN:  1. Ovarian serous carcinoma: -I have reviewed CT scan results with the patient.  CA-125 is pending.  I have personally compared CT scan with the latest PET CT scan.  Both nodal lesions showed improvement in size. -She reported some weakness but is able to do all her day-to-day activities. -We reviewed her labs today.  LFTs are normal.  White count and platelet count is also normal. -We will proceed with cycle 4 today.  We will continue dose reduction for better tolerability. -I will make a referral to Dr. Denman George again.  We will see her back in 3 weeks for follow-up.   2. Low back pain: -This has improved since start of therapy.  Not requiring MSIR.  3. Constipation: -Continue Senokot 1 tablet daily.  4. Hypertension: -Continue Norvasc and losartan/HCTZ.  5. Leg swelling: -She has not required Lasix in the last few weeks.  6.  Difficulty sleeping: -Temazepam did not work.  However she is taking lorazepam from previous leftovers which is helping.  She will ask for refill when she runs out of it.  7.  Bladder spasms: -She is taking Vesicare which is helping.   Orders placed this encounter:  No orders  of the defined types were placed in this encounter.    Derek Jack, MD [facility] [phone]   I, Milinda Antis, am acting as a scribe for Dr. Sanda Linger.  I, Derek Jack MD, have reviewed the above documentation for accuracy and completeness, and I agree with the above.

## 2019-10-06 NOTE — Patient Instructions (Signed)
[facility] at [facility] Discharge Instructions  You were seen today by Dr. Delton Coombes. He went over your recent results and scans. You received your treatment today. You will be referred to Dr. Denman George for further surgical evaluation. Try to be active as much as possible. Dr. Delton Coombes will see you back in 3 weeks for labs and follow up.   Thank you for choosing [facility] at  [facility] to provide your oncology and hematology care.  To afford each patient quality time with our provider, please arrive at least 15 minutes before your scheduled appointment time.   If you have a lab appointment with the [facility] please come in thru the Main Entrance and check in at the main information desk  You need to re-schedule your appointment should you arrive 10 or more minutes late.  We strive to give you quality time with our providers, and arriving late affects you and other patients whose appointments are after yours.  Also, if you no show three or more times for appointments you may be dismissed from the clinic at the providers discretion.     Again, thank you for choosing [facility].  Our hope is that these requests will decrease the amount of time that you wait before being seen by our physicians.       _____________________________________________________________  Should you have questions after your visit to [facility], please contact our office at [phone] between the hours of 8:00 a.m. and 4:30 p.m.  Voicemails left after 4:00 p.m. will not be returned until the following business day.  For prescription refill requests, have your pharmacy contact our office and allow 72 hours.    Cancer Center Support Programs:   > Cancer Support Group  2nd Tuesday of the month 1pm-2pm, Journey Room

## 2019-10-06 NOTE — Progress Notes (Signed)
Patient was assessed by Dr. Delton Coombes and labs have been reviewed.  Orders received for 1 liter normal saline with electrolytes. Patient is okay to proceed with treatment today. Primary RN and pharmacy aware.

## 2019-10-06 NOTE — Telephone Encounter (Signed)
Dr Tomie China office call and scheduled the patient for a follow surgery appt. Patient scheduled 9/29 and Dr Tomie China office will give the patient the appt

## 2019-10-06 NOTE — Patient Instructions (Signed)
[facility] Discharge Instructions for Patients Receiving Chemotherapy   Beginning January 23rd 2017 lab work for the [facility] will be done in the  Main lab at [facility] on 1st floor. If you have a lab appointment with the [facility] please come in thru the  Main Entrance and check in at the main information desk   Today you received the following chemotherapy agents Taxol and Carboplatin as well as IV hydration. Follow-up as scheduled  To help prevent nausea and vomiting after your treatment, we encourage you to take your nausea medication   If you develop nausea and vomiting, or diarrhea that is not controlled by your medication, call the clinic.  The clinic phone number is [phone]. Office hours are Monday-Friday 8:30am-5:00pm.  BELOW ARE SYMPTOMS THAT SHOULD BE REPORTED IMMEDIATELY:  *FEVER GREATER THAN 101.0 F  *CHILLS WITH OR WITHOUT FEVER  NAUSEA AND VOMITING THAT IS NOT CONTROLLED WITH YOUR NAUSEA MEDICATION  *UNUSUAL SHORTNESS OF BREATH  *UNUSUAL BRUISING OR BLEEDING  TENDERNESS IN MOUTH AND THROAT WITH OR WITHOUT PRESENCE OF ULCERS  *URINARY PROBLEMS  *BOWEL PROBLEMS  UNUSUAL RASH Items with * indicate a potential emergency and should be followed up as soon as possible. If you have an emergency after office hours please contact your primary care physician or go to the nearest emergency department.  Please call the clinic during office hours if you have any questions or concerns.   You may also contact the Patient Navigator at [phone] should you have any questions or need assistance in obtaining follow up care.      Resources For Cancer Patients and their Caregivers ? American Cancer Society: Can assist with transportation, wigs, general needs, runs Look Good Feel Better.        458-144-6422 ? Cancer Care: Provides financial assistance, online support groups, medication/co-pay assistance.  1-800-813-HOPE  (279) 262-0826) ? [facility]ists [place] cancer patients and their families through emotional , educational and financial support.  (810)244-0940 ? [place] DSS Where to apply for food stamps, Medicaid and utility assistance. 626-617-8088 ? [facility]: Transportation to medical appointments. 630 563 9592 ? Social Security Administration: May apply for disability if have a Stage IV cancer. 314-605-5975 (534)020-7021 ? [employer], Disability and Transit Services: Assists with nutrition, care and transit needs. 7264488657

## 2019-10-06 NOTE — Progress Notes (Signed)
0914 Labs reviewed with and pt seen by Dr. Delton Coombes and pt approved for Taxol and Carboplatin infusions today with 1 liter of IV hydration with magnesium and potassium over 2 hours today as well per MD

## 2019-10-06 NOTE — Patient Instructions (Signed)
[facility] at [facility] Discharge Instructions  Labs drawn from portacath today   Thank you for choosing [facility] at [facility] to provide your oncology and hematology care.  To afford each patient quality time with our provider, please arrive at least 15 minutes before your scheduled appointment time.   If you have a lab appointment with the Cancer Center please come in thru the Main Entrance and check in at the main information desk.  You need to re-schedule your appointment should you arrive 10 or more minutes late.  We strive to give you quality time with our providers, and arriving late affects you and other patients whose appointments are after yours.  Also, if you no show three or more times for appointments you may be dismissed from the clinic at the providers discretion.     Again, thank you for choosing [facility].  Our hope is that these requests will decrease the amount of time that you wait before being seen by our physicians.       _____________________________________________________________  Should you have questions after your visit to [facility], please contact our office at [phone] and follow the prompts.  Our office hours are 8:00 a.m. and 4:30 p.m. Monday - Friday.  Please note that voicemails left after 4:00 p.m. may not be returned until the following business day.  We are closed weekends and major holidays.  You do have access to a nurse 24-7, just call the main number to the clinic [phone] and do not press any options, hold on the line and a nurse will answer the phone.    For prescription refill requests, have your pharmacy contact our office and allow 72 hours.    Due to Covid, you will need to wear a mask upon entering the hospital. If you do not have a mask, a mask will be given to you at the Main Entrance upon arrival. For doctor visits, patients may have 1 support person age 18  or older with them. For treatment visits, patients can not have anyone with them due to social distancing guidelines and our immunocompromised population.     

## 2019-10-06 NOTE — Progress Notes (Signed)
Neulasta OBI placement per MD orders. OBI device filled per protocol and placed on left Upper Arm. Needle/catheter placement noted prior to patient leaving. Tolerated without incident and aware of injection to be delivered in 27 hours.  Tolerated infusions w/o adverse reaction.  Alert, in no distress.  Discharged ambulatory in stable condition.

## 2019-10-07 LAB — CA 125: Cancer Antigen (CA) 125: 21.3 U/mL (ref 0.0–38.1)

## 2019-10-11 ENCOUNTER — Other Ambulatory Visit: Payer: Self-pay

## 2019-10-11 ENCOUNTER — Encounter: Payer: Self-pay | Admitting: Internal Medicine

## 2019-10-11 ENCOUNTER — Ambulatory Visit (INDEPENDENT_AMBULATORY_CARE_PROVIDER_SITE_OTHER): Payer: Medicare Other | Admitting: Internal Medicine

## 2019-10-11 VITALS — BP 130/78 | HR 95 | Temp 98.2°F | Ht 64.0 in | Wt 185.0 lb

## 2019-10-11 DIAGNOSIS — Z0000 Encounter for general adult medical examination without abnormal findings: Secondary | ICD-10-CM

## 2019-10-11 DIAGNOSIS — E559 Vitamin D deficiency, unspecified: Secondary | ICD-10-CM

## 2019-10-11 DIAGNOSIS — Z23 Encounter for immunization: Secondary | ICD-10-CM | POA: Diagnosis not present

## 2019-10-11 DIAGNOSIS — E1140 Type 2 diabetes mellitus with diabetic neuropathy, unspecified: Secondary | ICD-10-CM | POA: Diagnosis not present

## 2019-10-11 DIAGNOSIS — I700 Atherosclerosis of aorta: Secondary | ICD-10-CM

## 2019-10-11 NOTE — Progress Notes (Signed)
Subjective:    Patient ID: Laura [name], female    DOB: 10/24/1945, 74 y.o.   MRN: [id number]  HPI  Here for wellness and f/u;  Overall doing ok;  Pt denies Chest pain, worsening SOB, DOE, wheezing, orthopnea, PND, worsening LE edema, palpitations, dizziness or syncope.  Pt denies neurological change such as new headache, facial or extremity weakness.  Pt denies polydipsia, polyuria, or low sugar symptoms. Pt states overall good compliance with treatment and medications, good tolerability, and has been trying to follow appropriate diet.  Pt denies worsening depressive symptoms, suicidal ideation or panic. No fever, night sweats, wt loss, loss of appetite, or other constitutional symptoms.  Pt states good ability with ADL's, has low fall risk, home safety reviewed and adequate, no other significant changes in hearing or vision, and only occasionally active with exercise. Anxiety overall improvved with lorazepam she was trying not to take, but is taking now.  Has had mild worsening reflux, butno abd pain, dysphagia, n/v, bowel change or blood, all helped with pepcid AC so does not feel she needs other tx. Has GYN oncology f/u tomorrow after erecent CT scan, to consider surgury.  Has had no pain for 2 months doing well. Hair starting to come back now.   Past Medical History:  Diagnosis Date  . Anxiety   . Arthritis    back of neck, bones spurs on neck  . Cataract   . Cervical disc disease   . Diabetes mellitus   . Family history of thyroid cancer   . Hyperlipidemia   . Hypertension   . Mucoid cyst of joint    right thumb  . Neuropathy   . Port-A-Cath in place 07/21/2019  . Reflux   . Sleep apnea    wears CPAP nightly  . Vertigo    Past Surgical History:  Procedure Laterality Date  . BLADDER SURGERY    . BREAST EXCISIONAL BIOPSY Bilateral   . BREAST SURGERY    . CHOLECYSTECTOMY    . fibroids removed     breast (both breasts)  . IR IMAGING GUIDED PORT INSERTION  07/26/2019  . MASS  EXCISION Right 06/26/2016   Procedure: EXCISION MUCOID TUMOR RIGHT THUMB, IP RIGHT THUMB;  Surgeon: Daryll Brod, MD;  Location: [facility];  Service: Orthopedics;  Laterality: Right;  . PARTIAL HYSTERECTOMY      reports that she has never smoked. She has never used smokeless tobacco. She reports that she does not drink alcohol and does not use drugs. family history includes Diabetes in her father, maternal aunt, and sister; Hypertension in her father, mother, sister, and another family member; Stroke in her sister; Thyroid cancer (age of onset: 22) in her daughter. Allergies  Allergen Reactions  . Hydrocodone-Homatropine Other (See Comments)    Vertigo *pt strongly prefers to never take*  . Sulfa Antibiotics Hives, Itching and Swelling    Tongue swells  . Augmentin [Amoxicillin-Pot Clavulanate]     Diarrhea; can take PCN/ Amoxicillin  . Codeine Itching  . Crestor [Rosuvastatin Calcium] Other (See Comments)    Did something to memory    . Doxycycline Other (See Comments)    Severe rectal Gas.  . Gabapentin     disoriented  . Keflex [Cephalexin] Diarrhea and Nausea And Vomiting  . Naproxen Other (See Comments)    Stomach cramps  . Statins     Muscle weakness  . Prednisone Anxiety    *pt strongly prefers to never be given prednisone*  Current Outpatient Medications on File Prior to Visit  Medication Sig Dispense Refill  . amLODipine (NORVASC) 5 MG tablet TAKE ONE (1) TABLET BY MOUTH EVERY DAY (Patient taking differently: Take 5 mg by mouth daily. ) 90 tablet 1  . Bismuth Subsalicylate (PEPTO-BISMOL PO)     . Blood Glucose Monitoring Suppl (ONE TOUCH ULTRA 2) w/Device KIT Use as directed 1 each 0  . CARBOPLATIN IV Inject into the vein every 21 ( twenty-one) days.    . cetirizine (ZYRTEC) 10 MG tablet Take 10 mg by mouth daily.    . cholecalciferol (VITAMIN D3) 25 MCG (1000 UNIT) tablet Take 2,000 Units by mouth daily.    Marland Kitchen Dextromethorphan-guaiFENesin (DELSYM  COUGH/CHEST CONGEST DM) 5-100 MG/5ML LIQD     . furosemide (LASIX) 20 MG tablet Take 1 tablet (20 mg total) by mouth daily. 30 tablet 2  . glimepiride (AMARYL) 2 MG tablet TAKE ONE TABLET BY MOUTH DAILY 90 tablet 3  . Lancets (ONETOUCH ULTRASOFT) lancets USE TO CHECK BLOOD SUGAR TWICE DAILY 100 each 3  . lidocaine-prilocaine (EMLA) cream Apply a small amount to port a cath site and cover with plastic wrap 1 hour prior to chemotherapy appointments 30 g 3  . loperamide (IMODIUM) 2 MG capsule Take by mouth as needed for diarrhea or loose stools.     Marland Kitchen LORazepam (ATIVAN) 0.5 MG tablet     . losartan-hydrochlorothiazide (HYZAAR) 100-12.5 MG tablet Take 1 tablet by mouth daily. 90 tablet 3  . morphine (MSIR) 15 MG tablet Take 1 tablet (15 mg total) by mouth 2 (two) times daily as needed for severe pain. (Patient not taking: Reported on 10/12/2019) 30 tablet 0  . ONETOUCH ULTRA test strip USE TO CHECK BLOOD SUGARS TWO TIMES DAILY 100 strip 3  . OVER THE COUNTER MEDICATION Apply 1 application topically daily as needed (pain). Triderma otc pain cream     . PACLITAXEL IV Inject 175 mg/m2 into the vein every 21 ( twenty-one) days.    . Pegfilgrastim (NEULASTA ONPRO [place]) Inject into the skin every 21 ( twenty-one) days.    . prochlorperazine (COMPAZINE) 10 MG tablet Take 1 tablet (10 mg total) by mouth every 6 (six) hours as needed (Nausea or vomiting). 30 tablet 1  . sennosides-docusate sodium (SENOKOT-S) 8.6-50 MG tablet Take 2 tablets by mouth daily.    . solifenacin (VESICARE) 5 MG tablet     . vitamin B-12 (CYANOCOBALAMIN) 1000 MCG tablet Take 2,000 mcg by mouth daily.     No current facility-administered medications on file prior to visit.   Review of Systems All otherwise neg per pt    Objective:   Physical Exam BP 130/78 (BP Location: Left Arm, Patient Position: Sitting, Cuff Size: Large)   Pulse 95   Temp 98.2 F (36.8 C) (Oral)   Ht 5' 4"  (1.626 m)   Wt 185 lb (83.9 kg)   SpO2 99%   BMI  31.76 kg/m  VS noted,  Constitutional: Pt appears in NAD HENT: Head: NCAT.  Right Ear: External ear normal.  Left Ear: External ear normal.  Eyes: . Pupils are equal, round, and reactive to light. Conjunctivae and EOM are normal Nose: without d/c or deformity Neck: Neck supple. Gross normal ROM Cardiovascular: Normal rate and regular rhythm.   Pulmonary/Chest: Effort normal and breath sounds without rales or wheezing.  Abd:  Soft, NT, ND, + BS, no organomegaly Neurological: Pt is alert. At baseline orientation, motor grossly intact Skin: Skin is warm. No  rashes, other new lesions, no LE edema Psychiatric: Pt behavior is normal without agitation  All otherwise neg per pt Lab Results  Component Value Date   WBC 6.0 10/06/2019   HGB 10.7 (L) 10/06/2019   HCT 32.1 (L) 10/06/2019   PLT 178 10/06/2019   GLUCOSE 172 (H) 10/06/2019   CHOL 281 (H) 08/25/2018   TRIG 238.0 (H) 08/25/2018   HDL 52.20 08/25/2018   LDLDIRECT 207.0 08/25/2018   LDLCALC 166 (H) 04/12/2014   ALT 16 10/06/2019   AST 17 10/06/2019   NA 136 10/06/2019   K 3.6 10/06/2019   CL 103 10/06/2019   CREATININE 1.10 (H) 10/06/2019   BUN 20 10/06/2019   CO2 24 10/06/2019   TSH 1.65 03/09/2018   INR 1.0 06/06/2019   HGBA1C 7.3 (H) 04/04/2019   MICROALBUR 0.7 03/09/2018      Assessment & Plan:

## 2019-10-11 NOTE — Patient Instructions (Addendum)
Please remember to call about scheduling your mammogram and yearly eye exam  You had the flu shot today  Please continue all other medications as before, and refills have been done if requested.  Please have the pharmacy call with any other refills you may need.  Please continue your efforts at being more active, low cholesterol diet, and weight control.  You are otherwise up to date with prevention measures today.  Please keep your appointments with your specialists as you may have planned - GYN oncology tomorrow  We can hold off on more blood work today  Please make an Appointment to return in 6 months, or sooner if needed

## 2019-10-12 ENCOUNTER — Inpatient Hospital Stay: Payer: Medicare Other | Attending: Gynecologic Oncology | Admitting: Gynecologic Oncology

## 2019-10-12 ENCOUNTER — Other Ambulatory Visit: Payer: Self-pay

## 2019-10-12 ENCOUNTER — Encounter: Payer: Self-pay | Admitting: Gynecologic Oncology

## 2019-10-12 VITALS — BP 160/71 | HR 86 | Temp 97.9°F | Resp 18 | Wt 184.6 lb

## 2019-10-12 DIAGNOSIS — C569 Malignant neoplasm of unspecified ovary: Secondary | ICD-10-CM

## 2019-10-12 DIAGNOSIS — E1136 Type 2 diabetes mellitus with diabetic cataract: Secondary | ICD-10-CM | POA: Diagnosis not present

## 2019-10-12 DIAGNOSIS — C775 Secondary and unspecified malignant neoplasm of intrapelvic lymph nodes: Secondary | ICD-10-CM | POA: Diagnosis not present

## 2019-10-12 DIAGNOSIS — C801 Malignant (primary) neoplasm, unspecified: Secondary | ICD-10-CM | POA: Diagnosis not present

## 2019-10-12 DIAGNOSIS — E669 Obesity, unspecified: Secondary | ICD-10-CM | POA: Diagnosis not present

## 2019-10-12 DIAGNOSIS — I10 Essential (primary) hypertension: Secondary | ICD-10-CM | POA: Insufficient documentation

## 2019-10-12 DIAGNOSIS — E7800 Pure hypercholesterolemia, unspecified: Secondary | ICD-10-CM | POA: Diagnosis not present

## 2019-10-12 DIAGNOSIS — C800 Disseminated malignant neoplasm, unspecified: Secondary | ICD-10-CM

## 2019-10-12 DIAGNOSIS — Z6834 Body mass index (BMI) 34.0-34.9, adult: Secondary | ICD-10-CM | POA: Diagnosis not present

## 2019-10-12 DIAGNOSIS — Z7984 Long term (current) use of oral hypoglycemic drugs: Secondary | ICD-10-CM | POA: Diagnosis not present

## 2019-10-12 DIAGNOSIS — I700 Atherosclerosis of aorta: Secondary | ICD-10-CM | POA: Insufficient documentation

## 2019-10-12 DIAGNOSIS — Z90710 Acquired absence of both cervix and uterus: Secondary | ICD-10-CM | POA: Diagnosis not present

## 2019-10-12 DIAGNOSIS — Z79899 Other long term (current) drug therapy: Secondary | ICD-10-CM | POA: Diagnosis not present

## 2019-10-12 DIAGNOSIS — Z9221 Personal history of antineoplastic chemotherapy: Secondary | ICD-10-CM | POA: Diagnosis not present

## 2019-10-12 NOTE — Progress Notes (Signed)
Follow-up Note: Gyn-Onc  Consult was requested by Dr. Delton Coombes for the evaluation of SAHALIE BETH 74 y.o. female  CC:  Chief Complaint  Patient presents with  . Primary ovarian adenocarcinoma    follow up    Assessment/Plan:  Ms. EARLA CHARLIE  is a 74 y.o.  year old with bulky retroperitoneal metastatic gynecologic high grade carcinoma (likely fallopian tube or ovarian primary).  There are no pelvic masses/visible primary sources on imaging. S/p 4 cycles of neoadjuvant carboplatin and paclitaxel chemotherapy. Mixed response on imaging.  While there is no progression per se, there is modest response radiographically, and I do not feel that Braelyn would be a good candidate for interval debulking at this time as I feel it would be unlikely we could achieve no gross residual disease.  Additionally she is worried about embarking upon a major laparotomy surgery would prefer to wait until she might be a candidate for minimally invasive approach.  Therefore I am recommending an additional 2 cycles of carboplatin paclitaxel chemotherapy after which time I would recommend repeat imaging, this time with a PET/CT.  This might help evaluate for avidity within the lymph node tissues in the periaortic region.  Given that her CA-125 is normalized despite large residual retroperitoneal masses, it is possible that some of the mass that we are seeing is necrotic nonviable tumor.  A PET may help elucidate the viability of residual tumor at the sites prior to contemplating surgical resection.  I will see her back after imaging and additional cycles of chemotherapy.  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   The patient's history began in March, 2021 when she began experiencing flank and abdominal pains. These were evaluated at [facility] with a CT  abd/pelvis which showed mixed solid and cystic lesions within the retroperitoneum centered predominantly in the aortocaval interval and tracking into the right iliac chain.  The largest solid component appeared to measure 5.9 x 4.3 x 6.4 cm.  To the left of the aorta was an additional lesion measuring approximately 4 x 3.6 x 4.6 cm.  The lesions partially encased to the abdominal aorta as well as the IMA origin, proximal right renal artery, and right common iliac.  There was compression of the IVC with loss of clearly discernible fat plane and a small segment concerning for possible intraluminal extension.  The uterus was surgically absent and there were no concerning adnexal masses seen.  No obvious distant metastatic disease was identified.  There was severe right hydroureter ureter nephrosis to the level of the mid ureter.  This was likely secondary to occlusion by the retroperitoneal node masses.  There was moderate left hydronephrosis possibly secondary to a distended urinary bladder versus compression from the left nodal mass.  She subsequently was seen and evaluated by oncologist, Dr Delton Coombes, who performed a PET scan on May 17, 2019.  This revealed the previously demonstrated retroperitoneal masses which were hypermetabolic consistent with malignancy.  There is probable associated partial obstruction of the right mid ureter.  There was no hypermetabolic activity within the neck or chest.  There was no hypermetabolic areas within the pelvis.  An IR guided lymph node biopsy was performed on Jun 03, 2019 and revealed adenocarcinoma with some antibodies.  The carcinoma had high-grade features with histology consistent with a possible serous  carcinoma.  Immunostains were positive for cytokeratin 7, p53, PAX 8, and weak focal positivity for ER.  This immunostain profile was consistent with a gynecologic primary.  The patient was initially disinterested in chemotherapy and desired surgical resection, not  understanding that both, and certainly chemotherapy, would be necessary for curative intent treatment.  The patient's medical history is remarkable for obesity with a BMI of 34 kg meters squared.  She has type 2 diabetes mellitus for which she is treated with oral medications.  She additionally has hypercholesterolemia and hypertension.  Her primary care physician is Dr. Cathlean Cower whom she sees 3-4 times per year.  Her surgical and gynecologic history is remarkable for a vaginal hysterectomy performed in her early 47s for benign uterine fibroids.  Her ovaries and fallopian tubes were not removed at that surgery.  She had a prior tubal ligation after pregnancies.  She had a laparoscopic cholecystectomy and vaginal bladder tacking with Dr. Gaynelle Arabian.  Her family history was remarkable only for daughter with a history of thyroid cancer.  The patient had seen Dr. Karsten Ro in the recent past for bladder discomfort symptoms for which she was treated medically.  She did not have stents placed.  Interval Hx:  She went on to receive 3 cycles of neoadjuvant carboplatin paclitaxel chemotherapy as she was deemed to not have surgically resectable disease at initial presentation due to the bulky and likely invasive nature of her retroperitoneal adenopathy.  She struggled somewhat with neoadjuvant chemotherapy requiring dose modifications.  After cycle 3 was administered she underwent a repeat CT scan of the abdomen and pelvis performed on October 04, 2019.  This revealed that the retroperitoneal lymphadenopathy in the lower aortocaval space at the level of the bifurcations had decreased enhancement and a decrease in size measuring 5.2 x 2.9 cm (compared to 6 x 4.3 cm previously).  There was low-attenuation lymphadenopathy in the left parotid region with mild increase in size and index node measuring 5 x 4.4 cm which had slightly increased from 4.5 to 3.7 cm previously.  There were no new sites of lymphadenopathy  identified.  Ca1 25 was drawn on October 06, 2019 which was day 1 of cycle 4.  This had normalized to 21.3 (prior to treatment in June 2021 this value was 346).  The patient reported feeling well overall.  She does have fatigue and neuropathy and some gastrointestinal distress from her chemotherapy.  Current Meds:  Outpatient Encounter Medications as of 10/12/2019  Medication Sig  . amLODipine (NORVASC) 5 MG tablet TAKE ONE (1) TABLET BY MOUTH EVERY DAY (Patient taking differently: Take 5 mg by mouth daily. )  . Bismuth Subsalicylate (PEPTO-BISMOL PO)   . Blood Glucose Monitoring Suppl (ONE TOUCH ULTRA 2) w/Device KIT Use as directed  . CARBOPLATIN IV Inject into the vein every 21 ( twenty-one) days.  . cetirizine (ZYRTEC) 10 MG tablet Take 10 mg by mouth daily.  . cholecalciferol (VITAMIN D3) 25 MCG (1000 UNIT) tablet Take 2,000 Units by mouth daily.  Marland Kitchen Dextromethorphan-guaiFENesin (DELSYM COUGH/CHEST CONGEST DM) 5-100 MG/5ML LIQD   . furosemide (LASIX) 20 MG tablet Take 1 tablet (20 mg total) by mouth daily.  Marland Kitchen glimepiride (AMARYL) 2 MG tablet TAKE ONE TABLET BY MOUTH DAILY  . Lancets (ONETOUCH ULTRASOFT) lancets USE TO CHECK BLOOD SUGAR TWICE DAILY  . lidocaine-prilocaine (EMLA) cream Apply a small amount to port a cath site and cover with plastic wrap 1 hour prior to chemotherapy appointments  . loperamide (IMODIUM) 2 MG  capsule Take by mouth as needed for diarrhea or loose stools.   Marland Kitchen LORazepam (ATIVAN) 0.5 MG tablet   . losartan-hydrochlorothiazide (HYZAAR) 100-12.5 MG tablet Take 1 tablet by mouth daily.  Glory Rosebush ULTRA test strip USE TO CHECK BLOOD SUGARS TWO TIMES DAILY  . OVER THE COUNTER MEDICATION Apply 1 application topically daily as needed (pain). Triderma otc pain cream   . PACLITAXEL IV Inject 175 mg/m2 into the vein every 21 ( twenty-one) days.  . Pegfilgrastim (NEULASTA ONPRO [place]) Inject into the skin every 21 ( twenty-one) days.  . prochlorperazine (COMPAZINE) 10 MG  tablet Take 1 tablet (10 mg total) by mouth every 6 (six) hours as needed (Nausea or vomiting).  . sennosides-docusate sodium (SENOKOT-S) 8.6-50 MG tablet Take 2 tablets by mouth daily.  . solifenacin (VESICARE) 5 MG tablet   . vitamin B-12 (CYANOCOBALAMIN) 1000 MCG tablet Take 2,000 mcg by mouth daily.  Marland Kitchen morphine (MSIR) 15 MG tablet Take 1 tablet (15 mg total) by mouth 2 (two) times daily as needed for severe pain. (Patient not taking: Reported on 10/12/2019)   No facility-administered encounter medications on file as of 10/12/2019.    Allergy:  Allergies  Allergen Reactions  . Hydrocodone-Homatropine Other (See Comments)    Vertigo *pt strongly prefers to never take*  . Sulfa Antibiotics Hives, Itching and Swelling    Tongue swells  . Augmentin [Amoxicillin-Pot Clavulanate]     Diarrhea; can take PCN/ Amoxicillin  . Codeine Itching  . Crestor [Rosuvastatin Calcium] Other (See Comments)    Did something to memory    . Doxycycline Other (See Comments)    Severe rectal Gas.  . Gabapentin     disoriented  . Keflex [Cephalexin] Diarrhea and Nausea And Vomiting  . Naproxen Other (See Comments)    Stomach cramps  . Statins     Muscle weakness  . Prednisone Anxiety    *pt strongly prefers to never be given prednisone*     Social Hx:   Social History   Socioeconomic History  . Marital status: Divorced    Spouse name: Not on file  . Number of children: 4  . Years of education: 51  . Highest education level: Not on file  Occupational History  . Occupation: retired Geographical information systems officer  Tobacco Use  . Smoking status: Never Smoker  . Smokeless tobacco: Never Used  Vaping Use  . Vaping Use: Never used  Substance and Sexual Activity  . Alcohol use: No    Alcohol/week: 0.0 standard drinks  . Drug use: No  . Sexual activity: Not Currently  Other Topics Concern  . Not on file  Social History Narrative  . Not on file   Social Determinants of Health   Financial  Resource Strain: Low Risk   . Difficulty of Paying Living Expenses: Not hard at all  Food Insecurity: No Food Insecurity  . Worried About Charity fundraiser in the Last Year: Never true  . Ran Out of Food in the Last Year: Never true  Transportation Needs: No Transportation Needs  . Lack of Transportation (Medical): No  . Lack of Transportation (Non-Medical): No  Physical Activity: Inactive  . Days of Exercise per Week: 0 days  . Minutes of Exercise per Session: 0 min  Stress: No Stress Concern Present  . Feeling of Stress : Not at all  Social Connections: Moderately Integrated  . Frequency of Communication with Friends and Family: More than three times a week  . Frequency  of Social Gatherings with Friends and Family: More than three times a week  . Attends Religious Services: More than 4 times per year  . Active Member of Clubs or Organizations: Yes  . Attends Archivist Meetings: Never  . Marital Status: Divorced  Human resources officer Violence: Not At Risk  . Fear of Current or Ex-Partner: No  . Emotionally Abused: No  . Physically Abused: No  . Sexually Abused: No    Past Surgical Hx:  Past Surgical History:  Procedure Laterality Date  . BLADDER SURGERY    . BREAST EXCISIONAL BIOPSY Bilateral   . BREAST SURGERY    . CHOLECYSTECTOMY    . fibroids removed     breast (both breasts)  . IR IMAGING GUIDED PORT INSERTION  07/26/2019  . MASS EXCISION Right 06/26/2016   Procedure: EXCISION MUCOID TUMOR RIGHT THUMB, IP RIGHT THUMB;  Surgeon: Daryll Brod, MD;  Location: [facility];  Service: Orthopedics;  Laterality: Right;  . PARTIAL HYSTERECTOMY      Past Medical Hx:  Past Medical History:  Diagnosis Date  . Anxiety   . Arthritis    back of neck, bones spurs on neck  . Cataract   . Cervical disc disease   . Diabetes mellitus   . Family history of thyroid cancer   . Hyperlipidemia   . Hypertension   . Mucoid cyst of joint    right thumb  .  Neuropathy   . Port-A-Cath in place 07/21/2019  . Reflux   . Sleep apnea    wears CPAP nightly  . Vertigo     Past Gynecological History:  See HPI No LMP recorded. Patient has had a hysterectomy.  Family Hx:  Family History  Problem Relation Age of Onset  . Hypertension Other   . Diabetes Father   . Hypertension Father   . Diabetes Sister   . Hypertension Mother   . Hypertension Sister   . Stroke Sister   . Diabetes Maternal Aunt   . Thyroid cancer Daughter 26  . Colon cancer Neg Hx   . Esophageal cancer Neg Hx   . Stomach cancer Neg Hx   . Rectal cancer Neg Hx   . Breast cancer Neg Hx   . Endometrial cancer Neg Hx   . Ovarian cancer Neg Hx     Review of Systems:  Constitutional  Feels well,  Mild flank/back pain  ENT Normal appearing ears and nares bilaterally Skin/Breast  No rash, sores, jaundice, itching, dryness Cardiovascular  No chest pain, shortness of breath, or edema  Pulmonary  No cough or wheeze.  Gastro Intestinal  No nausea, vomitting, or diarrhoea. No bright red blood per rectum, no abdominal pain, change in bowel movement, or constipation.  Genito Urinary  No frequency, urgency, dysuria, denies hematuria Musculo Skeletal  No myalgia, arthralgia, joint swelling or pain  Neurologic  No weakness, numbness, change in gait,  Psychology  No depression, anxiety, insomnia.   Vitals:  Blood pressure (!) 160/71, pulse 86, temperature 97.9 F (36.6 C), resp. rate 18, weight 184 lb 9.6 oz (83.7 kg), SpO2 100 %.  Physical Exam: WD in NAD Neck  Supple NROM, without any enlargements.  Lymph Node Survey No cervical supraclavicular or inguinal adenopathy Cardiovascular  Pulse normal rate, regularity and rhythm. S1 and S2 normal.  Lungs  Clear to auscultation bilateraly, without wheezes/crackles/rhonchi. Good air movement.  Skin  No rash/lesions/breakdown  Psychiatry  Alert and oriented to person, place, and time  Abdomen  Normoactive bowel sounds,  abdomen soft, non-tender and obese without evidence of hernia.  Back No CVA tenderness Genito Urinary  Vulva/vagina: Normal external female genitalia.  No lesions. No discharge or bleeding.  Bladder/urethra:  No lesions or masses, well supported bladder  Vagina: smooth walled, no palpable masses in pelvis on bimanual exam. Surgically absent uterus. Rectal  deferred Extremities  No bilateral cyanosis, clubbing or edema.   Thereasa Solo, MD  10/12/2019, 5:53 PM

## 2019-10-12 NOTE — Patient Instructions (Signed)
Dr Denman George is recommending rechecking a PET scan after your 6th dose of chemotherapy. She will then re-evaluate you to see if the tumors have shrunk enough for surgery to be a possibilty.

## 2019-10-14 ENCOUNTER — Other Ambulatory Visit: Payer: Self-pay

## 2019-10-14 ENCOUNTER — Other Ambulatory Visit (HOSPITAL_COMMUNITY): Payer: Self-pay | Admitting: *Deleted

## 2019-10-14 ENCOUNTER — Inpatient Hospital Stay (HOSPITAL_COMMUNITY): Payer: Medicare Other | Attending: Hematology

## 2019-10-14 DIAGNOSIS — F519 Sleep disorder not due to a substance or known physiological condition, unspecified: Secondary | ICD-10-CM | POA: Insufficient documentation

## 2019-10-14 DIAGNOSIS — R1110 Vomiting, unspecified: Secondary | ICD-10-CM | POA: Insufficient documentation

## 2019-10-14 DIAGNOSIS — Z9989 Dependence on other enabling machines and devices: Secondary | ICD-10-CM | POA: Insufficient documentation

## 2019-10-14 DIAGNOSIS — Z5189 Encounter for other specified aftercare: Secondary | ICD-10-CM | POA: Insufficient documentation

## 2019-10-14 DIAGNOSIS — E785 Hyperlipidemia, unspecified: Secondary | ICD-10-CM | POA: Insufficient documentation

## 2019-10-14 DIAGNOSIS — Z79899 Other long term (current) drug therapy: Secondary | ICD-10-CM | POA: Insufficient documentation

## 2019-10-14 DIAGNOSIS — R600 Localized edema: Secondary | ICD-10-CM | POA: Insufficient documentation

## 2019-10-14 DIAGNOSIS — K219 Gastro-esophageal reflux disease without esophagitis: Secondary | ICD-10-CM | POA: Insufficient documentation

## 2019-10-14 DIAGNOSIS — I10 Essential (primary) hypertension: Secondary | ICD-10-CM | POA: Insufficient documentation

## 2019-10-14 DIAGNOSIS — E119 Type 2 diabetes mellitus without complications: Secondary | ICD-10-CM | POA: Insufficient documentation

## 2019-10-14 DIAGNOSIS — Z5111 Encounter for antineoplastic chemotherapy: Secondary | ICD-10-CM | POA: Insufficient documentation

## 2019-10-14 DIAGNOSIS — C569 Malignant neoplasm of unspecified ovary: Secondary | ICD-10-CM | POA: Insufficient documentation

## 2019-10-14 DIAGNOSIS — G4730 Sleep apnea, unspecified: Secondary | ICD-10-CM | POA: Insufficient documentation

## 2019-10-14 LAB — URINALYSIS, ROUTINE W REFLEX MICROSCOPIC
Bilirubin Urine: NEGATIVE
Glucose, UA: NEGATIVE mg/dL
Hgb urine dipstick: NEGATIVE
Ketones, ur: NEGATIVE mg/dL
Leukocytes,Ua: NEGATIVE
Nitrite: NEGATIVE
Protein, ur: NEGATIVE mg/dL
Specific Gravity, Urine: 1.006 (ref 1.005–1.030)
pH: 6 (ref 5.0–8.0)

## 2019-10-14 NOTE — Progress Notes (Signed)
Patient called clinic reporting urgency/frequency urinating.  She will come today to give urine sample to check for infection.

## 2019-10-16 ENCOUNTER — Encounter: Payer: Self-pay | Admitting: Internal Medicine

## 2019-10-16 NOTE — Assessment & Plan Note (Signed)
stable overall by history and exam, recent data reviewed with pt, and pt to continue medical treatment as before,  to f/u any worsening symptoms or concerns  

## 2019-10-16 NOTE — Assessment & Plan Note (Signed)

## 2019-10-16 NOTE — Assessment & Plan Note (Signed)
Cont oral replace,ent

## 2019-10-16 NOTE — Assessment & Plan Note (Signed)
Statin intolerant, for lower chol diet

## 2019-10-18 ENCOUNTER — Encounter: Payer: Self-pay | Admitting: Internal Medicine

## 2019-10-18 ENCOUNTER — Other Ambulatory Visit (HOSPITAL_COMMUNITY): Payer: Self-pay | Admitting: Hematology

## 2019-10-18 ENCOUNTER — Inpatient Hospital Stay (HOSPITAL_COMMUNITY): Payer: Medicare Other | Admitting: General Practice

## 2019-10-18 DIAGNOSIS — Z95828 Presence of other vascular implants and grafts: Secondary | ICD-10-CM

## 2019-10-18 DIAGNOSIS — C569 Malignant neoplasm of unspecified ovary: Secondary | ICD-10-CM

## 2019-10-18 NOTE — Progress Notes (Signed)
[facility] CSW Progress Notes  Call to patient to check in.  She reports that sleep has been "sporadic", has gotten more "energy", tries to "listen to my body and respond when I am too tired."  Feels very tired for 1 - 2 weeks post treatment, then regains strength.  Daughters and son call and check in w her, try to fill her needs.  Had struggled w loss of taste due to chemotherapy.  Has been exploring different strategies to work through this.  Found yogurt w probiotics very helpful.  Recently met old friend who had similar cancer and treatment - she is doing well now.    She is working on solutions to various concerns which have interfered w her quality of life - Indigestion made it difficult for her to sleep - when not sleeping, she feels more tired.  She has found foods that she can eat and that do not upset her stomach.  She has taken some medications for sleep.  She is working at connecting w others for increased support.  She is maintaining a positive attitude and is hopeful that her treatments will be effective.  She is understanding of the natural cycle of fatigue/energy post treatment.  At this point, she believes she has the tools needed to be successful in treatment, she will call as needed for support/resources int he future.  Edwyna Shell, LCSW Clinical Social Worker Phone:  [phone]

## 2019-10-19 ENCOUNTER — Other Ambulatory Visit: Payer: Self-pay | Admitting: Internal Medicine

## 2019-10-19 ENCOUNTER — Other Ambulatory Visit (HOSPITAL_COMMUNITY): Payer: Self-pay | Admitting: Hematology

## 2019-10-19 MED ORDER — SOLIFENACIN SUCCINATE 5 MG PO TABS
5.0000 mg | ORAL_TABLET | Freq: Every day | ORAL | 3 refills | Status: DC
Start: 2019-10-19 — End: 2019-10-19

## 2019-10-19 MED ORDER — ESZOPICLONE 2 MG PO TABS: 2.0000 mg | ORAL_TABLET | Freq: Every evening | ORAL | 1 refills | Status: DC | PRN

## 2019-10-21 ENCOUNTER — Ambulatory Visit (HOSPITAL_COMMUNITY): Payer: Medicare Other

## 2019-10-21 NOTE — Progress Notes (Signed)
Nutrition Follow-up:  Patient with ovarian cancer.  Patient is receiving neoadjuvant chemotherapy.   Spoke with patient via phone for nutrition follow-up.  Patient reports that she is eating better and sleeping better.  Reports that she is eating yogurt and feels that has helped her taste buds and coating in her mouth.  Reports yesterday was able to eat oatmeal with blueberries. Lunch was chicken with baked beans.  Snacked on chips, fruit.  Patient is drinking boost original. Was concerned about sugar in boost plus.  Reports that she is able to do what she wants to do without giving out.      Medications: reviewed  Labs: reviewed  Anthropometrics:   Weight 184 lb 9.6 oz on 9/29 decreased from 188 lb on 9/6   NUTRITION DIAGNOSIS: Inadequate oral intake continues   INTERVENTION:  Encouraged patient to continue with eating high calorie, high protein foods.  We discussed ways to add calories in foods that she is already eating.   Continue boost shakes as able.    MONITORING, EVALUATION, GOAL: weight trends, intake   NEXT VISIT: November 5 phone f/u  Haliey Romberg B. Zenia Resides, [place], [place] Registered Dietitian [phone] (mobile)

## 2019-10-24 DIAGNOSIS — E119 Type 2 diabetes mellitus without complications: Secondary | ICD-10-CM | POA: Diagnosis not present

## 2019-10-24 DIAGNOSIS — H5203 Hypermetropia, bilateral: Secondary | ICD-10-CM | POA: Diagnosis not present

## 2019-10-24 DIAGNOSIS — H25013 Cortical age-related cataract, bilateral: Secondary | ICD-10-CM | POA: Diagnosis not present

## 2019-10-24 LAB — HM DIABETES EYE EXAM

## 2019-10-27 ENCOUNTER — Encounter (HOSPITAL_COMMUNITY): Payer: Self-pay | Admitting: Hematology

## 2019-10-27 ENCOUNTER — Other Ambulatory Visit: Payer: Self-pay

## 2019-10-27 ENCOUNTER — Inpatient Hospital Stay (HOSPITAL_COMMUNITY): Payer: Medicare Other

## 2019-10-27 ENCOUNTER — Inpatient Hospital Stay (HOSPITAL_BASED_OUTPATIENT_CLINIC_OR_DEPARTMENT_OTHER): Payer: Medicare Other | Admitting: Hematology

## 2019-10-27 VITALS — BP 167/73 | HR 85 | Temp 97.0°F | Resp 18

## 2019-10-27 VITALS — BP 165/71 | HR 75 | Temp 96.9°F | Resp 18 | Wt 186.4 lb

## 2019-10-27 DIAGNOSIS — E785 Hyperlipidemia, unspecified: Secondary | ICD-10-CM | POA: Diagnosis not present

## 2019-10-27 DIAGNOSIS — Z95828 Presence of other vascular implants and grafts: Secondary | ICD-10-CM

## 2019-10-27 DIAGNOSIS — G4730 Sleep apnea, unspecified: Secondary | ICD-10-CM | POA: Diagnosis not present

## 2019-10-27 DIAGNOSIS — Z5189 Encounter for other specified aftercare: Secondary | ICD-10-CM | POA: Diagnosis not present

## 2019-10-27 DIAGNOSIS — E119 Type 2 diabetes mellitus without complications: Secondary | ICD-10-CM | POA: Diagnosis not present

## 2019-10-27 DIAGNOSIS — I10 Essential (primary) hypertension: Secondary | ICD-10-CM | POA: Diagnosis not present

## 2019-10-27 DIAGNOSIS — Z79899 Other long term (current) drug therapy: Secondary | ICD-10-CM | POA: Diagnosis not present

## 2019-10-27 DIAGNOSIS — R1110 Vomiting, unspecified: Secondary | ICD-10-CM | POA: Diagnosis not present

## 2019-10-27 DIAGNOSIS — F519 Sleep disorder not due to a substance or known physiological condition, unspecified: Secondary | ICD-10-CM | POA: Diagnosis not present

## 2019-10-27 DIAGNOSIS — K219 Gastro-esophageal reflux disease without esophagitis: Secondary | ICD-10-CM | POA: Diagnosis not present

## 2019-10-27 DIAGNOSIS — C569 Malignant neoplasm of unspecified ovary: Secondary | ICD-10-CM

## 2019-10-27 DIAGNOSIS — Z9989 Dependence on other enabling machines and devices: Secondary | ICD-10-CM | POA: Diagnosis not present

## 2019-10-27 DIAGNOSIS — R600 Localized edema: Secondary | ICD-10-CM | POA: Diagnosis not present

## 2019-10-27 DIAGNOSIS — Z5111 Encounter for antineoplastic chemotherapy: Secondary | ICD-10-CM | POA: Diagnosis not present

## 2019-10-27 LAB — COMPREHENSIVE METABOLIC PANEL
ALT: 13 U/L (ref 0–44)
AST: 14 U/L — ABNORMAL LOW (ref 15–41)
Albumin: 4.3 g/dL (ref 3.5–5.0)
Alkaline Phosphatase: 64 U/L (ref 38–126)
Anion gap: 10 (ref 5–15)
BUN: 20 mg/dL (ref 8–23)
CO2: 22 mmol/L (ref 22–32)
Calcium: 9.8 mg/dL (ref 8.9–10.3)
Chloride: 103 mmol/L (ref 98–111)
Creatinine, Ser: 0.87 mg/dL (ref 0.44–1.00)
GFR, Estimated: >60 mL/min (ref >60)
Glucose, Bld: 138 mg/dL — ABNORMAL HIGH (ref 70–99)
Potassium: 3.6 mmol/L (ref 3.5–5.1)
Sodium: 135 mmol/L (ref 135–145)
Total Bilirubin: 0.7 mg/dL (ref 0.3–1.2)
Total Protein: 7.1 g/dL (ref 6.5–8.1)

## 2019-10-27 LAB — CBC WITH DIFFERENTIAL/PLATELET
Abs Immature Granulocytes: 0.02 10*3/uL (ref 0.00–0.07)
Basophils Absolute: 0 10*3/uL (ref 0.0–0.1)
Basophils Relative: 0 %
Eosinophils Absolute: 0.1 10*3/uL (ref 0.0–0.5)
Eosinophils Relative: 1 %
HCT: 30.6 % — ABNORMAL LOW (ref 36.0–46.0)
Hemoglobin: 10.5 g/dL — ABNORMAL LOW (ref 12.0–15.0)
Immature Granulocytes: 0 %
Lymphocytes Relative: 26 %
Lymphs Abs: 1.5 10*3/uL (ref 0.7–4.0)
MCH: 31 pg (ref 26.0–34.0)
MCHC: 34.3 g/dL (ref 30.0–36.0)
MCV: 90.3 fL (ref 80.0–100.0)
Monocytes Absolute: 0.5 10*3/uL (ref 0.1–1.0)
Monocytes Relative: 9 %
Neutro Abs: 3.6 10*3/uL (ref 1.7–7.7)
Neutrophils Relative %: 64 %
Platelets: 152 10*3/uL (ref 150–400)
RBC: 3.39 MIL/uL — ABNORMAL LOW (ref 3.87–5.11)
RDW: 16.3 % — ABNORMAL HIGH (ref 11.5–15.5)
WBC: 5.7 10*3/uL (ref 4.0–10.5)
nRBC: 0 % (ref 0.0–0.2)

## 2019-10-27 MED ORDER — PALONOSETRON HCL INJECTION 0.25 MG/5ML
0.2500 mg | Freq: Once | INTRAVENOUS | Status: AC
  Administered 2019-10-27: 0.25 mg via INTRAVENOUS
  Filled 2019-10-27: qty 5

## 2019-10-27 MED ORDER — SODIUM CHLORIDE 0.9 % IV SOLN
460.0000 mg | Freq: Once | INTRAVENOUS | Status: AC
  Administered 2019-10-27: 460 mg via INTRAVENOUS
  Filled 2019-10-27: qty 46

## 2019-10-27 MED ORDER — HEPARIN SOD (PORK) LOCK FLUSH 100 UNIT/ML IV SOLN
500.0000 [IU] | Freq: Once | INTRAVENOUS | Status: AC | PRN
  Administered 2019-10-27: 500 [IU]

## 2019-10-27 MED ORDER — DIPHENHYDRAMINE HCL 50 MG/ML IJ SOLN
50.0000 mg | Freq: Once | INTRAMUSCULAR | Status: AC
  Administered 2019-10-27: 50 mg via INTRAVENOUS
  Filled 2019-10-27: qty 1

## 2019-10-27 MED ORDER — SODIUM CHLORIDE 0.9 % IV SOLN
150.0000 mg | Freq: Once | INTRAVENOUS | Status: AC
  Administered 2019-10-27: 150 mg via INTRAVENOUS
  Filled 2019-10-27: qty 150

## 2019-10-27 MED ORDER — SODIUM CHLORIDE 0.9 % IV SOLN
140.0000 mg/m2 | Freq: Once | INTRAVENOUS | Status: AC
  Administered 2019-10-27: 282 mg via INTRAVENOUS
  Filled 2019-10-27: qty 47

## 2019-10-27 MED ORDER — LORAZEPAM 1 MG PO TABS
ORAL_TABLET | ORAL | Status: AC
  Filled 2019-10-27: qty 1

## 2019-10-27 MED ORDER — SODIUM CHLORIDE 0.9 % IV SOLN: Freq: Once | INTRAVENOUS | Status: AC

## 2019-10-27 MED ORDER — PEGFILGRASTIM 6 MG/0.6ML ~~LOC~~ PSKT
6.0000 mg | PREFILLED_SYRINGE | Freq: Once | SUBCUTANEOUS | Status: AC
  Administered 2019-10-27: 6 mg via SUBCUTANEOUS
  Filled 2019-10-27: qty 0.6

## 2019-10-27 MED ORDER — LORAZEPAM 1 MG PO TABS
1.0000 mg | ORAL_TABLET | Freq: Once | ORAL | Status: AC
  Administered 2019-10-27: 1 mg via ORAL

## 2019-10-27 MED ORDER — SODIUM CHLORIDE 0.9% FLUSH: 10.0000 mL | INTRAVENOUS | Status: DC | PRN

## 2019-10-27 MED ORDER — FAMOTIDINE IN NACL 20-0.9 MG/50ML-% IV SOLN
20.0000 mg | Freq: Once | INTRAVENOUS | Status: AC
  Administered 2019-10-27: 20 mg via INTRAVENOUS
  Filled 2019-10-27: qty 50

## 2019-10-27 MED ORDER — SODIUM CHLORIDE 0.9 % IV SOLN
10.0000 mg | Freq: Once | INTRAVENOUS | Status: AC
  Administered 2019-10-27: 10 mg via INTRAVENOUS
  Filled 2019-10-27: qty 10

## 2019-10-27 NOTE — Progress Notes (Signed)
Patient was assessed by Dr. Katragadda and labs have been reviewed.  Patient is okay to proceed with treatment today. Primary RN and pharmacy aware.   

## 2019-10-27 NOTE — Progress Notes (Signed)
Infusions tolerated without incident or complaint. VSS upon completion of treatment. Port flushed and deaccessed per protocol, see MAR and IV flowsheet for details. Discharged in satisfactory condition with follow up instructions. 

## 2019-10-27 NOTE — Progress Notes (Signed)
Labs reviewed with Md today. Will proceed as planned.   Pre-medications given. Patient seems to be uncomfortable, like she cannot stay still. Patient ambulated to [facility] with assistance. Returned to chair, still unable to get comfortable, keeps moving to try to get settled but cannot. MD notified. Will give 1 mg of ativan PO per verbal order from MD.

## 2019-10-27 NOTE — Progress Notes (Signed)
[place] [place], [place] [address]   CLINIC:  Medical Oncology/Hematology  PCP:  Laura Borg, MD [place] / [place] [address] [phone]   REASON FOR VISIT:  Follow-up for ovarian cancer  PRIOR THERAPY: None  NGS Results: Foundation 1 MS--stable  CURRENT THERAPY: Carboplatin and paclitaxel every 3 weeks  BRIEF ONCOLOGIC HISTORY:  Oncology History  Primary ovarian adenocarcinoma, unspecified laterality ([place])  06/20/2019 Initial Diagnosis   Primary ovarian adenocarcinoma, unspecified laterality ([place])   07/01/2019 Genetic Testing   Foundation One     07/11/2019 Genetic Testing   Negative genetic testing:  No pathogenic variants detected on the [facility] Multi-Cancer Panel. The report date is 07/11/2019.  The Multi-Cancer Panel offered by [facility] includes sequencing and/or deletion duplication testing of the following 85 genes: AIP, ALK, APC, ATM, AXIN2,BAP1,  BARD1, BLM, BMPR1A, BRCA1, BRCA2, BRIP1, CASR, CDC73, CDH1, CDK4, CDKN1B, CDKN1C, CDKN2A (p14ARF), CDKN2A (p16INK4a), CEBPA, CHEK2, CTNNA1, DICER1, DIS3L2, EGFR (c.2369C>T, p.Thr790Met variant only), EPCAM (Deletion/duplication testing only), FH, FLCN, GATA2, GPC3, GREM1 (Promoter region deletion/duplication testing only), HOXB13 (c.251G>A, p.Gly84Glu), HRAS, KIT, MAX, MEN1, MET, MITF (c.952G>A, p.Glu318Lys variant only), MLH1, MSH2, MSH3, MSH6, MUTYH, NBN, NF1, NF2, NTHL1, PALB2, PDGFRA, PHOX2B, PMS2, POLD1, POLE, POT1, PRKAR1A, PTCH1, PTEN, RAD50, RAD51C, RAD51D, RB1, RECQL4, RET, RNF43, RUNX1, SDHAF2, SDHA (sequence changes only), SDHB, SDHC, SDHD, SMAD4, SMARCA4, SMARCB1, SMARCE1, STK11, SUFU, TERC, TERT, TMEM127, TP53, TSC1, TSC2, VHL, WRN and WT1.   07/28/2019 -  Chemotherapy   The patient had palonosetron (ALOXI) injection 0.25 mg, 0.25 mg, Intravenous,  Once, 4 of 6 cycles Administration: 0.25 mg (07/28/2019), 0.25 mg (08/18/2019), 0.25 mg (09/15/2019), 0.25 mg  (10/06/2019) pegfilgrastim (NEULASTA ONPRO KIT) injection 6 mg, 6 mg, Subcutaneous, Once, 4 of 6 cycles Administration: 6 mg (07/28/2019), 6 mg (08/18/2019), 6 mg (09/15/2019), 6 mg (10/06/2019) CARBOplatin (PARAPLATIN) 470 mg in sodium chloride 0.9 % 250 mL chemo infusion, 470 mg (100 % of original dose 470 mg), Intravenous,  Once, 4 of 6 cycles Dose modification:   (original dose 470 mg, Cycle 1, Reason: Provider Judgment),   (original dose 568.2 mg, Cycle 2),   (original dose 460.5 mg, Cycle 3), 460 mg (original dose 460 mg, Cycle 4, Reason: Provider Judgment),   (Cycle 5, Reason: Provider Judgment) Administration: 470 mg (07/28/2019), 570 mg (08/18/2019), 460 mg (09/15/2019), 460 mg (10/06/2019) fosaprepitant (EMEND) 150 mg in sodium chloride 0.9 % 145 mL IVPB, 150 mg, Intravenous,  Once, 4 of 6 cycles Administration: 150 mg (07/28/2019), 150 mg (08/18/2019), 150 mg (09/15/2019), 150 mg (10/06/2019) PACLitaxel (TAXOL) 282 mg in sodium chloride 0.9 % 250 mL chemo infusion (> 63m/m2), 140 mg/m2 = 282 mg (80 % of original dose 175 mg/m2), Intravenous,  Once, 4 of 6 cycles Dose modification: 140 mg/m2 (80 % of original dose 175 mg/m2, Cycle 1, Reason: Provider Judgment), 150 mg/m2 (original dose 175 mg/m2, Cycle 2, Reason: Provider Judgment), 140 mg/m2 (original dose 175 mg/m2, Cycle 3, Reason: Provider Judgment), 140 mg/m2 (original dose 175 mg/m2, Cycle 4, Reason: Other (see comments), Comment: fatigue) Administration: 282 mg (07/28/2019), 300 mg (08/18/2019), 282 mg (09/15/2019), 282 mg (10/06/2019)  for chemotherapy treatment.      CANCER STAGING: Cancer Staging No matching staging information was found for the patient.  INTERVAL HISTORY:  [name] a 74y.o. female, returns for routine follow-up and consideration for next cycle of chemotherapy. Laura [name] last seen on 10/06/2019.  Due for cycle #5 of carboplatin  and paclitaxel today.   Overall, she tells me she has been feeling pretty well. Her taste is  improving since she started eating Activia and her appetite has improved. She tolerated the previous treatment well, though she had some vomiting afterwards. She denies having any mouth sores.  She visited Laura [name] on 9/29 to discuss the operative options.  Overall, she feels ready for next cycle of chemo today.    REVIEW OF SYSTEMS:  Review of Systems  Constitutional: Positive for appetite change (75%).  HENT:   Negative for mouth sores.   Gastrointestinal: Positive for vomiting (after Tx).  Genitourinary: Positive for difficulty urinating (bladder aches at night).   Neurological: Positive for numbness (R big toe).  All other systems reviewed and are negative.   PAST MEDICAL/SURGICAL HISTORY:  Past Medical History:  Diagnosis Date  . Anxiety   . Arthritis    back of neck, bones spurs on neck  . Cataract   . Cervical disc disease   . Diabetes mellitus   . Family history of thyroid cancer   . Hyperlipidemia   . Hypertension   . Mucoid cyst of joint    right thumb  . Neuropathy   . Port-A-Cath in place 07/21/2019  . Reflux   . Sleep apnea    wears CPAP nightly  . Vertigo    Past Surgical History:  Procedure Laterality Date  . BLADDER SURGERY    . BREAST EXCISIONAL BIOPSY Bilateral   . BREAST SURGERY    . CHOLECYSTECTOMY    . fibroids removed     breast (both breasts)  . IR IMAGING GUIDED PORT INSERTION  07/26/2019   right  . MASS EXCISION Right 06/26/2016   Procedure: EXCISION MUCOID TUMOR RIGHT THUMB, IP RIGHT THUMB;  Surgeon: Daryll Brod, MD;  Location: [facility];  Service: Orthopedics;  Laterality: Right;  . PARTIAL HYSTERECTOMY      SOCIAL HISTORY:  Social History   Socioeconomic History  . Marital status: Divorced    Spouse name: Not on file  . Number of children: 4  . Years of education: 33  . Highest education level: Not on file  Occupational History  . Occupation: retired Geographical information systems officer  Tobacco Use  . Smoking status: Never  Smoker  . Smokeless tobacco: Never Used  Vaping Use  . Vaping Use: Never used  Substance and Sexual Activity  . Alcohol use: No    Alcohol/week: 0.0 standard drinks  . Drug use: No  . Sexual activity: Not Currently  Other Topics Concern  . Not on file  Social History Narrative  . Not on file   Social Determinants of Health   Financial Resource Strain: Low Risk   . Difficulty of Paying Living Expenses: Not hard at all  Food Insecurity: No Food Insecurity  . Worried About Charity fundraiser in the Last Year: Never true  . Ran Out of Food in the Last Year: Never true  Transportation Needs: No Transportation Needs  . Lack of Transportation (Medical): No  . Lack of Transportation (Non-Medical): No  Physical Activity: Inactive  . Days of Exercise per Week: 0 days  . Minutes of Exercise per Session: 0 min  Stress: No Stress Concern Present  . Feeling of Stress : Not at all  Social Connections: Moderately Integrated  . Frequency of Communication with Friends and Family: More than three times a week  . Frequency of Social Gatherings with Friends and Family: More than three times  a week  . Attends Religious Services: More than 4 times per year  . Active Member of Clubs or Organizations: Yes  . Attends Archivist Meetings: Never  . Marital Status: Divorced  Human resources officer Violence: Not At Risk  . Fear of Current or Ex-Partner: No  . Emotionally Abused: No  . Physically Abused: No  . Sexually Abused: No    FAMILY HISTORY:  Family History  Problem Relation Age of Onset  . Hypertension Other   . Diabetes Father   . Hypertension Father   . Diabetes Sister   . Hypertension Mother   . Hypertension Sister   . Stroke Sister   . Diabetes Maternal Aunt   . Thyroid cancer Daughter 37  . Colon cancer Neg Hx   . Esophageal cancer Neg Hx   . Stomach cancer Neg Hx   . Rectal cancer Neg Hx   . Breast cancer Neg Hx   . Endometrial cancer Neg Hx   . Ovarian cancer Neg Hx      CURRENT MEDICATIONS:  Current Outpatient Medications  Medication Sig Dispense Refill  . amLODipine (NORVASC) 5 MG tablet TAKE ONE (1) TABLET BY MOUTH EVERY DAY (Patient taking differently: Take 5 mg by mouth daily. ) 90 tablet 1  . Blood Glucose Monitoring Suppl (ONE TOUCH ULTRA 2) w/Device KIT Use as directed 1 each 0  . CARBOPLATIN IV Inject into the vein every 21 ( twenty-one) days.    . cetirizine (ZYRTEC) 10 MG tablet Take 10 mg by mouth daily.    . cholecalciferol (VITAMIN D3) 25 MCG (1000 UNIT) tablet Take 2,000 Units by mouth daily.    Marland Kitchen Dextromethorphan-guaiFENesin (DELSYM COUGH/CHEST CONGEST DM) 5-100 MG/5ML LIQD     . eszopiclone (LUNESTA) 2 MG TABS tablet Take 1 tablet (2 mg total) by mouth at bedtime as needed for sleep. Take immediately before bedtime 90 tablet 1  . glimepiride (AMARYL) 2 MG tablet TAKE ONE TABLET BY MOUTH DAILY 90 tablet 3  . Lancets (ONETOUCH ULTRASOFT) lancets USE TO CHECK BLOOD SUGAR TWICE DAILY 100 each 3  . loperamide (IMODIUM) 2 MG capsule Take by mouth as needed for diarrhea or loose stools.     Marland Kitchen LORazepam (ATIVAN) 0.5 MG tablet     . losartan-hydrochlorothiazide (HYZAAR) 100-12.5 MG tablet Take 1 tablet by mouth daily. 90 tablet 3  . morphine (MSIR) 15 MG tablet Take 1 tablet (15 mg total) by mouth 2 (two) times daily as needed for severe pain. 30 tablet 0  . ONETOUCH ULTRA test strip USE TO CHECK BLOOD SUGARS TWO TIMES DAILY 100 strip 3  . OVER THE COUNTER MEDICATION Apply 1 application topically daily as needed (pain). Triderma otc pain cream     . PACLITAXEL IV Inject 175 mg/m2 into the vein every 21 ( twenty-one) days.    . Pegfilgrastim (NEULASTA ONPRO [place]) Inject into the skin every 21 ( twenty-one) days.    Marland Kitchen solifenacin (VESICARE) 5 MG tablet Take 1 tablet (5 mg total) by mouth daily. 90 tablet 3  . vitamin B-12 (CYANOCOBALAMIN) 1000 MCG tablet Take 2,000 mcg by mouth daily.    . famotidine (PEPCID AC MAXIMUM STRENGTH) 20 MG tablet  (Patient  not taking: Reported on 10/27/2019)    . lidocaine-prilocaine (EMLA) cream Apply a small amount to port a cath site and cover with plastic wrap 1 hour prior to chemotherapy appointments (Patient not taking: Reported on 10/27/2019) 30 g 3  . prochlorperazine (COMPAZINE) 10 MG tablet TAKE  1 TABLET (10 MG TOTAL) BY MOUTH EVERY 6 (SIX) HOURS AS NEEDED (NAUSEA OR VOMITING). (Patient not taking: Reported on 10/27/2019) 60 tablet 3   No current facility-administered medications for this visit.    ALLERGIES:  Allergies  Allergen Reactions  . Hydrocodone-Homatropine Other (See Comments)    Vertigo *pt strongly prefers to never take*  . Sulfa Antibiotics Hives, Itching and Swelling    Tongue swells  . Augmentin [Amoxicillin-Pot Clavulanate]     Diarrhea; can take PCN/ Amoxicillin  . Codeine Itching  . Crestor [Rosuvastatin Calcium] Other (See Comments)    Did something to memory    . Doxycycline Other (See Comments)    Severe rectal Gas.  . Gabapentin     disoriented  . Keflex [Cephalexin] Diarrhea and Nausea And Vomiting  . Naproxen Other (See Comments)    Stomach cramps  . Statins     Muscle weakness  . Prednisone Anxiety    *pt strongly prefers to never be given prednisone*     PHYSICAL EXAM:  Performance status (ECOG): 1 - Symptomatic but completely ambulatory  Vitals:   10/27/19 0919  BP: (!) 165/71  Pulse: 75  Resp: 18  Temp: (!) 96.9 F (36.1 C)  SpO2: 100%   Wt Readings from Last 3 Encounters:  10/27/19 186 lb 6.4 oz (84.6 kg)  10/12/19 184 lb 9.6 oz (83.7 kg)  10/11/19 185 lb (83.9 kg)   Physical Exam Vitals reviewed.  Constitutional:      Appearance: Normal appearance. She is obese.  HENT:     Mouth/Throat:     Lips: No lesions.     Mouth: No oral lesions.     Dentition: No gum lesions.     Tongue: No lesions.  Cardiovascular:     Rate and Rhythm: Normal rate and regular rhythm.     Pulses: Normal pulses.     Heart sounds: Normal heart sounds.   Pulmonary:     Effort: Pulmonary effort is normal.     Breath sounds: Normal breath sounds.  Chest:     Comments: Port-a-Cath in R chest Musculoskeletal:     Right lower leg: No edema.     Left lower leg: No edema.  Neurological:     General: No focal deficit present.     Mental Status: She is alert and oriented to person, place, and time.  Psychiatric:        Mood and Affect: Mood normal.        Behavior: Behavior normal.     LABORATORY DATA:  I have reviewed the labs as listed.  CBC Latest Ref Rng & Units 10/27/2019 10/06/2019 09/19/2019  WBC 4.0 - 10.5 K/uL 5.7 6.0 21.9(H)  Hemoglobin 12.0 - 15.0 g/dL 10.5(L) 10.7(L) 11.0(L)  Hematocrit 36 - 46 % 30.6(L) 32.1(L) 33.3(L)  Platelets 150 - 400 K/uL 152 178 187   CMP Latest Ref Rng & Units 10/27/2019 10/06/2019 09/19/2019  Glucose 70 - 99 mg/dL 138(H) 172(H) 85  BUN 8 - 23 mg/dL _0 Creatinine 0.44 - 1.00 mg/dL 0.87 1.10(H) 0.87  Sodium 135 - 145 mmol/L 135 136 134(L)  Potassium 3.5 - 5.1 mmol/L 3.6 3.6 4.3  Chloride 98 - 111 mmol/L 103 103 100  CO2 22 - 32 mmol/L _1 Calcium 8.9 - 10.3 mg/dL 9.8 9.8 9.8  Total Protein 6.5 - 8.1 g/dL 7.1 7.1 -  Total Bilirubin 0.3 - 1.2 mg/dL 0.7 0.6 -  Alkaline Phos 38 - 126  U/L 64 60 -  AST 15 - 41 U/L 14(L) 17 -  ALT 0 - 44 U/L 13 16 -   CA125 21.3 10/06/2019  CA125 346.0 06/23/2019    DIAGNOSTIC IMAGING:  I have independently reviewed the scans and discussed with the patient. CT Abdomen Pelvis W Contrast  Result Date: 10/04/2019 CLINICAL DATA:  Follow-up ovarian carcinoma. Currently undergoing chemotherapy. EXAM: CT ABDOMEN AND PELVIS WITH CONTRAST TECHNIQUE: Multidetector CT imaging of the abdomen and pelvis was performed using the standard protocol following bolus administration of intravenous contrast. CONTRAST:  175m OMNIPAQUE IOHEXOL 300 MG/ML  SOLN COMPARISON:  04/02/2019 FINDINGS: Lower Chest: No acute findings. Hepatobiliary: No hepatic masses identified. Prior  cholecystectomy. No evidence of biliary obstruction. Pancreas:  No mass or inflammatory changes. Spleen: Within normal limits in size and appearance. Adrenals/Urinary Tract: No masses identified. There has been resolution right hydroureteronephrosis since prior study. Pelvic floor laxity with cystocele again noted. Stomach/Bowel: Small hiatal hernia again seen. No evidence of obstruction, inflammatory process or abnormal fluid collections. Vascular/Lymphatic: Retroperitoneal lymphadenopathy in the lower aortocaval space at the level of the bifurcations shows decreased enhancement and is mildly decreased in size, currently measuring 5.2 x 2.9 cm on image 42/2, compared to 6.0 x 4.3 cm previously. Low-attenuation retroperitoneal lymphadenopathy in the left paraaortic region shows mild increase in size, with index node measuring 5.0 x 4.4 cm on image 28/2, compared to 4.5 x 3.7 cm when remeasured at same location on prior exam. No new sites of lymphadenopathy identified. Aortic atherosclerosis noted. No abdominal aortic aneurysm. Reproductive: Prior hysterectomy again noted. Adnexal regions are unremarkable. No evidence of ascites. Other:  None. Musculoskeletal:  No suspicious bone lesions identified. IMPRESSION: Mixed response of retroperitoneal lymphadenopathy, which has decreased in the aorto-caval space, but shows mild increase in the left paraaortic region. No new sites of metastatic disease identified. Resolution of right hydronephrosis since previous study. Small hiatal hernia. Pelvic floor laxity with cystocele. Aortic Atherosclerosis (ICD10-I70.0). Electronically Signed   By: JMarlaine HindM.D.   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 Mass measures 6 x 5.8 cm and is partially calcified. Separate solid component superior to this in  the left periaortic region measures 3.5 cm, SUV 14.3. Cystic component medial to the lower pole of the left kidney is without hypermetabolic activity, possibly a lymphocele. -CT-guided biopsy of the right retroperitoneal lymph node consistent with adenocarcinoma with psammoma bodies. Morphology and immunotherapy consistent with ovarian serous carcinoma/primary peritoneal carcinoma. -Germline mutation testing was negative. -Foundation 1 testing shows MS-stable. Loss of heterozygosity was less than 16%. -She met with Dr. RDenman Georgewho recommended neoadjuvant chemotherapywith 3-6 cycles of carboplatin and paclitaxel with interim staging after 3 cycles. If there is substantial response unless encasement of the aorta and vena cava, consideration for debulking procedure can be possible at that time. -CA-125 346 on 06/23/2019. -Cycle 1 of carboplatin and paclitaxel on 07/28/2019. -CTAP on 10/04/2019 after 3 cycles of chemotherapy showed retroperitoneal adenopathy at the bifurcation measuring 5.2 x 2.9 cm, left para-aortic lymphadenopathy measuring 5 x 4.4 cm, both of them decreased in size when compared to most recent PET scan.  CT scan report says that one of the lesion has gotten bigger but this was compared to CT scan from 04/02/2019.   PLAN:  1. Ovarian serous carcinoma: -Her CA-125 has normalized. -She tolerated cycle 4  very well.  She looks and feels well. -She was seen by Laura [name].  She was recommended to continue 2 more cycles of chemotherapy followed by PET CT scan. -Reviewed her chemistries which are grossly within normal limits.  CBC is also normal.  CA-125 is 17.6. -Proceed with cycle 5 with dose reduction of paclitaxel and carboplatin AUC 5. -RTC 3 weeks for follow-up.  2. Low back pain: -This has improved quite well since start of therapy.  Not requiring MSIR.  3. Constipation: -Continue Senokot 1 tablet daily.  4. Hypertension: -Continue Norvasc and losartan/HCTZ.  5. Leg  swelling: -She has Lasix but did not require in the last few weeks.  6. Difficulty sleeping: -Continue lorazepam as needed.  7.  Bladder spasms: -Continue Vesicare which is helping.   Orders placed this encounter:  No orders of the defined types were placed in this encounter.    Derek Jack, MD [facility] [phone]   I, Milinda Antis, am acting as a scribe for Dr. Sanda Linger.  I, Derek Jack MD, have reviewed the above documentation for accuracy and completeness, and I agree with the above.

## 2019-10-27 NOTE — Patient Instructions (Addendum)
[facility] at [facility] Discharge Instructions  You were seen today by Dr. Katragadda. He went over your recent results. You received your treatment today. Dr. Katragadda will see you back in 3 weeks for labs and follow up.   Thank you for choosing [facility] at [facility] to provide your oncology and hematology care.  To afford each patient quality time with our provider, please arrive at least 15 minutes before your scheduled appointment time.   If you have a lab appointment with the Cancer Center please come in thru the Main Entrance and check in at the main information desk  You need to re-schedule your appointment should you arrive 10 or more minutes late.  We strive to give you quality time with our providers, and arriving late affects you and other patients whose appointments are after yours.  Also, if you no show three or more times for appointments you may be dismissed from the clinic at the providers discretion.     Again, thank you for choosing [facility].  Our hope is that these requests will decrease the amount of time that you wait before being seen by our physicians.       _____________________________________________________________  Should you have questions after your visit to [facility], please contact our office at [phone] between the hours of 8:00 a.m. and 4:30 p.m.  Voicemails left after 4:00 p.m. will not be returned until the following business day.  For prescription refill requests, have your pharmacy contact our office and allow 72 hours.    Cancer Center Support Programs:   > Cancer Support Group  2nd Tuesday of the month 1pm-2pm, Journey Room    

## 2019-10-27 NOTE — Patient Instructions (Signed)
[facility] Discharge Instructions for Patients Receiving Chemotherapy   Beginning January 23rd 2017 lab work for the Cancer Center will be done in the  Main lab at [facility] on 1st floor. If you have a lab appointment with the Cancer Center please come in thru the  Main Entrance and check in at the main information desk   Today you received the following chemotherapy agents Taxol and Carboplatin  To help prevent nausea and vomiting after your treatment, we encourage you to take your nausea medication.   If you develop nausea and vomiting, or diarrhea that is not controlled by your medication, call the clinic.  The clinic phone number is [phone]. Office hours are Monday-Friday 8:30am-5:00pm.  BELOW ARE SYMPTOMS THAT SHOULD BE REPORTED IMMEDIATELY:  *FEVER GREATER THAN 101.0 F  *CHILLS WITH OR WITHOUT FEVER  NAUSEA AND VOMITING THAT IS NOT CONTROLLED WITH YOUR NAUSEA MEDICATION  *UNUSUAL SHORTNESS OF BREATH  *UNUSUAL BRUISING OR BLEEDING  TENDERNESS IN MOUTH AND THROAT WITH OR WITHOUT PRESENCE OF ULCERS  *URINARY PROBLEMS  *BOWEL PROBLEMS  UNUSUAL RASH Items with * indicate a potential emergency and should be followed up as soon as possible. If you have an emergency after office hours please contact your primary care physician or go to the nearest emergency department.  Please call the clinic during office hours if you have any questions or concerns.   You may also contact the Patient Navigator at [phone] should you have any questions or need assistance in obtaining follow up care.      Resources For Cancer Patients and their Caregivers ? American Cancer Society: Can assist with transportation, wigs, general needs, runs Look Good Feel Better.        1-888-227-6333 ? Cancer Care: Provides financial assistance, online support groups, medication/co-pay assistance.  1-800-813-HOPE (4673) ? [facility] Assists  [place] cancer patients and their families through emotional , educational and financial support.  336-427-4357 ? [place] DSS Where to apply for food stamps, Medicaid and utility assistance. 336-342-1394 ? [facility]: Transportation to medical appointments. 336-347-2287 ? Social Security Administration: May apply for disability if have a Stage IV cancer. 336-342-7796 1-800-772-1213 ? [place] Aging, Disability and Transit Services: Assists with nutrition, care and transit needs. 336-349-2343          

## 2019-10-28 ENCOUNTER — Other Ambulatory Visit: Payer: Self-pay | Admitting: Internal Medicine

## 2019-10-28 ENCOUNTER — Ambulatory Visit: Payer: Medicare Other

## 2019-10-28 DIAGNOSIS — Z1231 Encounter for screening mammogram for malignant neoplasm of breast: Secondary | ICD-10-CM

## 2019-10-28 LAB — CA 125: Cancer Antigen (CA) 125: 17.6 U/mL (ref 0.0–38.1)

## 2019-11-03 ENCOUNTER — Other Ambulatory Visit (HOSPITAL_COMMUNITY): Payer: Self-pay

## 2019-11-03 DIAGNOSIS — C569 Malignant neoplasm of unspecified ovary: Secondary | ICD-10-CM

## 2019-11-03 DIAGNOSIS — R1900 Intra-abdominal and pelvic swelling, mass and lump, unspecified site: Secondary | ICD-10-CM

## 2019-11-03 MED ORDER — MORPHINE SULFATE 15 MG PO TABS
15.0000 mg | ORAL_TABLET | Freq: Two times a day (BID) | ORAL | 0 refills | Status: DC | PRN
Start: 2019-11-03 — End: 2019-12-07

## 2019-11-10 ENCOUNTER — Encounter: Payer: Self-pay | Admitting: Internal Medicine

## 2019-11-11 ENCOUNTER — Other Ambulatory Visit: Payer: Self-pay

## 2019-11-11 ENCOUNTER — Encounter: Payer: Self-pay | Admitting: Internal Medicine

## 2019-11-11 ENCOUNTER — Other Ambulatory Visit: Payer: Self-pay | Admitting: Internal Medicine

## 2019-11-11 ENCOUNTER — Ambulatory Visit (INDEPENDENT_AMBULATORY_CARE_PROVIDER_SITE_OTHER): Payer: Medicare Other | Admitting: Internal Medicine

## 2019-11-11 VITALS — BP 160/78 | HR 62 | Temp 98.7°F | Ht 64.0 in | Wt 186.0 lb

## 2019-11-11 DIAGNOSIS — R350 Frequency of micturition: Secondary | ICD-10-CM | POA: Diagnosis not present

## 2019-11-11 DIAGNOSIS — E1140 Type 2 diabetes mellitus with diabetic neuropathy, unspecified: Secondary | ICD-10-CM

## 2019-11-11 DIAGNOSIS — I10 Essential (primary) hypertension: Secondary | ICD-10-CM | POA: Diagnosis not present

## 2019-11-11 DIAGNOSIS — M7751 Other enthesopathy of right foot: Secondary | ICD-10-CM | POA: Insufficient documentation

## 2019-11-11 LAB — URINALYSIS, ROUTINE W REFLEX MICROSCOPIC
Bilirubin Urine: NEGATIVE
Hgb urine dipstick: NEGATIVE
Ketones, ur: NEGATIVE
Leukocytes,Ua: NEGATIVE
Nitrite: NEGATIVE
RBC / HPF: NONE SEEN (ref 0–?)
Specific Gravity, Urine: 1.01 (ref 1.000–1.030)
Total Protein, Urine: NEGATIVE
Urine Glucose: NEGATIVE
Urobilinogen, UA: 0.2 (ref 0.0–1.0)
WBC, UA: NONE SEEN (ref 0–?)
pH: 7 (ref 5.0–8.0)

## 2019-11-11 LAB — HEMOGLOBIN A1C: Hgb A1c MFr Bld: 6.5 % (ref 4.6–6.5)

## 2019-11-11 MED ORDER — MELOXICAM 7.5 MG PO TABS: 7.5000 mg | ORAL_TABLET | Freq: Every day | ORAL | 2 refills | Status: DC | PRN

## 2019-11-11 NOTE — Patient Instructions (Signed)
Please take all new medication as prescribed - the anti-inflammatory for the right ankle tendonitis  Please continue all other medications as before, and refills have been done if requested.  Please have the pharmacy call with any other refills you may need.  Please continue your efforts at being more active, low cholesterol diet, and weight control  Please keep your appointments with your specialists as you may have planned  Please go to the LAB at the blood drawing area for the tests to be done  You will be contacted by phone if any changes need to be made immediately.  Otherwise, you will receive a letter about your results with an explanation, but please check with MyChart first.  Please remember to sign up for MyChart if you have not done so, as this will be important to you in the future with finding out test results, communicating by private email, and scheduling acute appointments online when needed.

## 2019-11-11 NOTE — Progress Notes (Signed)
Subjective:    Patient ID: Laura [name], female    DOB: 03/14/1945, 74 y.o.   MRN: [id number]  HPI  Here with c/o several days onset increased urinary frequency though has had some increased fluids as well, Denies urinary symptoms such as dysuria, urgency, flank pain, hematuria or n/v, fever, chills.  bp has been < 140/90.  Last uti aug 2021 tx with cipro.  Since then has also had right ankle pain medially without trauma swelling and wondering if related now to the cipro.  Pt denies chest pain, increased sob or doe, wheezing, orthopnea, PND, increased LE swelling, palpitations, dizziness or syncope.  Pt denies new neurological symptoms such as new headache, or facial or extremity weakness or numbness   Pt denies polydipsia, polyuria.  Denies worsening depressive symptoms, suicidal ideation, or panic Past Medical History:  Diagnosis Date  . Anxiety   . Arthritis    back of neck, bones spurs on neck  . Cataract   . Cervical disc disease   . Diabetes mellitus   . Family history of thyroid cancer   . Hyperlipidemia   . Hypertension   . Mucoid cyst of joint    right thumb  . Neuropathy   . Port-A-Cath in place 07/21/2019  . Reflux   . Sleep apnea    wears CPAP nightly  . Vertigo    Past Surgical History:  Procedure Laterality Date  . BLADDER SURGERY    . BREAST EXCISIONAL BIOPSY Bilateral   . BREAST SURGERY    . CHOLECYSTECTOMY    . fibroids removed     breast (both breasts)  . IR IMAGING GUIDED PORT INSERTION  07/26/2019   right  . MASS EXCISION Right 06/26/2016   Procedure: EXCISION MUCOID TUMOR RIGHT THUMB, IP RIGHT THUMB;  Surgeon: Daryll Brod, MD;  Location: [facility];  Service: Orthopedics;  Laterality: Right;  . PARTIAL HYSTERECTOMY      reports that she has never smoked. She has never used smokeless tobacco. She reports that she does not drink alcohol and does not use drugs. family history includes Diabetes in her father, maternal aunt, and sister;  Hypertension in her father, mother, sister, and another family member; Stroke in her sister; Thyroid cancer (age of onset: 60) in her daughter. Allergies  Allergen Reactions  . Ciprofloxacin Swelling  . Hydrocodone-Homatropine Other (See Comments)    Vertigo *pt strongly prefers to never take*  . Sulfa Antibiotics Hives, Itching and Swelling    Tongue swells  . Augmentin [Amoxicillin-Pot Clavulanate]     Diarrhea; can take PCN/ Amoxicillin  . Codeine Itching  . Crestor [Rosuvastatin Calcium] Other (See Comments)    Did something to memory    . Doxycycline Other (See Comments)    Severe rectal Gas.  . Gabapentin     disoriented  . Keflex [Cephalexin] Diarrhea and Nausea And Vomiting  . Naproxen Other (See Comments)    Stomach cramps  . Statins     Muscle weakness  . Prednisone Anxiety    *pt strongly prefers to never be given prednisone*    Current Outpatient Medications on File Prior to Visit  Medication Sig Dispense Refill  . Blood Glucose Monitoring Suppl (ONE TOUCH ULTRA 2) w/Device KIT Use as directed 1 each 0  . CARBOPLATIN IV Inject into the vein every 21 ( twenty-one) days.    . cetirizine (ZYRTEC) 10 MG tablet Take 10 mg by mouth daily.    . cholecalciferol (VITAMIN D3) 25  MCG (1000 UNIT) tablet Take 2,000 Units by mouth daily.    Marland Kitchen Dextromethorphan-guaiFENesin (DELSYM COUGH/CHEST CONGEST DM) 5-100 MG/5ML LIQD     . eszopiclone (LUNESTA) 2 MG TABS tablet Take 1 tablet (2 mg total) by mouth at bedtime as needed for sleep. Take immediately before bedtime 90 tablet 1  . glimepiride (AMARYL) 2 MG tablet TAKE ONE TABLET BY MOUTH DAILY 90 tablet 3  . Lancets (ONETOUCH ULTRASOFT) lancets USE TO CHECK BLOOD SUGAR TWICE DAILY 100 each 3  . loperamide (IMODIUM) 2 MG capsule Take by mouth as needed for diarrhea or loose stools.     Marland Kitchen LORazepam (ATIVAN) 0.5 MG tablet     . losartan-hydrochlorothiazide (HYZAAR) 100-12.5 MG tablet Take 1 tablet by mouth daily. 90 tablet 3  .  morphine (MSIR) 15 MG tablet Take 1 tablet (15 mg total) by mouth 2 (two) times daily as needed for severe pain. 30 tablet 0  . ONETOUCH ULTRA test strip USE TO CHECK BLOOD SUGARS TWO TIMES DAILY 100 strip 3  . OVER THE COUNTER MEDICATION Apply 1 application topically daily as needed (pain). Triderma otc pain cream     . PACLITAXEL IV Inject 175 mg/m2 into the vein every 21 ( twenty-one) days.    . Pegfilgrastim (NEULASTA ONPRO [place]) Inject into the skin every 21 ( twenty-one) days.    Marland Kitchen solifenacin (VESICARE) 5 MG tablet Take 1 tablet (5 mg total) by mouth daily. 90 tablet 3  . vitamin B-12 (CYANOCOBALAMIN) 1000 MCG tablet Take 2,000 mcg by mouth daily.     No current facility-administered medications on file prior to visit.   Review of Systems All otherwise neg per pt    Objective:   Physical Exam BP (!) 160/78 (BP Location: Left Arm, Patient Position: Sitting, Cuff Size: Large)   Pulse 62   Temp 98.7 F (37.1 C) (Oral)   Ht 5' 4"  (1.626 m)   Wt 186 lb (84.4 kg)   SpO2 99%   BMI 31.93 kg/m  VS noted,  Constitutional: Pt appears in NAD HENT: Head: NCAT.  Right Ear: External ear normal.  Left Ear: External ear normal.  Eyes: . Pupils are equal, round, and reactive to light. Conjunctivae and EOM are normal Nose: without d/c or deformity Neck: Neck supple. Gross normal ROM Cardiovascular: Normal rate and regular rhythm.   Pulmonary/Chest: Effort normal and breath sounds without rales or wheezing.  Right ankle without swelling, but some tenderness along the medial malleolus Neurological: Pt is alert. At baseline orientation, motor grossly intact Skin: Skin is warm. No rashes, other new lesions, no LE edema Psychiatric: Pt behavior is normal without agitation  All otherwise neg per pt  Lab Results  Component Value Date   WBC 5.7 10/27/2019   HGB 10.5 (L) 10/27/2019   HCT 30.6 (L) 10/27/2019   PLT 152 10/27/2019   GLUCOSE 138 (H) 10/27/2019   CHOL 281 (H) 08/25/2018   TRIG  238.0 (H) 08/25/2018   HDL 52.20 08/25/2018   LDLDIRECT 207.0 08/25/2018   LDLCALC 166 (H) 04/12/2014   ALT 13 10/27/2019   AST 14 (L) 10/27/2019   NA 135 10/27/2019   K 3.6 10/27/2019   CL 103 10/27/2019   CREATININE 0.87 10/27/2019   BUN 20 10/27/2019   CO2 22 10/27/2019   TSH 1.65 03/09/2018   INR 1.0 06/06/2019   HGBA1C 7.3 (H) 04/04/2019   MICROALBUR 0.7 03/09/2018        Assessment & Plan:

## 2019-11-12 ENCOUNTER — Encounter: Payer: Self-pay | Admitting: Internal Medicine

## 2019-11-12 LAB — URINE CULTURE: Result:: NO GROWTH

## 2019-11-12 NOTE — Assessment & Plan Note (Signed)
For urine studies, r/o uti  I spent 31 minutes in preparing to see the patient by review of recent labs, imaging and procedures, obtaining and reviewing separately obtained history, communicating with the patient and family or caregiver, ordering medications, tests or procedures, and documenting clinical information in the EHR including the differential Dx, treatment, and any further evaluation and other management of urinary freq, dm, htn, ankle tendonitis

## 2019-11-12 NOTE — Assessment & Plan Note (Signed)
stable overall by history and exam, recent data reviewed with pt, and pt to continue medical treatment as before,  to f/u any worsening symptoms or concerns  

## 2019-11-12 NOTE — Assessment & Plan Note (Signed)
Mild to mod, for mobic prn,,  to f/u any worsening symptoms or concerns

## 2019-11-13 ENCOUNTER — Encounter: Payer: Self-pay | Admitting: Internal Medicine

## 2019-11-14 ENCOUNTER — Encounter (HOSPITAL_COMMUNITY): Payer: Medicare Other

## 2019-11-17 ENCOUNTER — Other Ambulatory Visit: Payer: Self-pay

## 2019-11-17 ENCOUNTER — Inpatient Hospital Stay (HOSPITAL_COMMUNITY): Payer: Medicare Other | Attending: Hematology | Admitting: Hematology

## 2019-11-17 ENCOUNTER — Inpatient Hospital Stay (HOSPITAL_COMMUNITY): Payer: Medicare Other

## 2019-11-17 VITALS — BP 163/82 | HR 95 | Temp 97.3°F | Resp 18

## 2019-11-17 VITALS — BP 190/79 | HR 90 | Temp 97.0°F | Resp 18 | Wt 187.1 lb

## 2019-11-17 DIAGNOSIS — K5900 Constipation, unspecified: Secondary | ICD-10-CM | POA: Insufficient documentation

## 2019-11-17 DIAGNOSIS — Z79899 Other long term (current) drug therapy: Secondary | ICD-10-CM | POA: Insufficient documentation

## 2019-11-17 DIAGNOSIS — Z5189 Encounter for other specified aftercare: Secondary | ICD-10-CM | POA: Diagnosis not present

## 2019-11-17 DIAGNOSIS — I10 Essential (primary) hypertension: Secondary | ICD-10-CM | POA: Insufficient documentation

## 2019-11-17 DIAGNOSIS — C569 Malignant neoplasm of unspecified ovary: Secondary | ICD-10-CM | POA: Diagnosis present

## 2019-11-17 DIAGNOSIS — M1990 Unspecified osteoarthritis, unspecified site: Secondary | ICD-10-CM | POA: Diagnosis not present

## 2019-11-17 DIAGNOSIS — Z1589 Genetic susceptibility to other disease: Secondary | ICD-10-CM | POA: Insufficient documentation

## 2019-11-17 DIAGNOSIS — N3289 Other specified disorders of bladder: Secondary | ICD-10-CM | POA: Diagnosis not present

## 2019-11-17 DIAGNOSIS — G479 Sleep disorder, unspecified: Secondary | ICD-10-CM | POA: Diagnosis not present

## 2019-11-17 DIAGNOSIS — Z5111 Encounter for antineoplastic chemotherapy: Secondary | ICD-10-CM | POA: Diagnosis not present

## 2019-11-17 DIAGNOSIS — M545 Low back pain, unspecified: Secondary | ICD-10-CM | POA: Diagnosis not present

## 2019-11-17 DIAGNOSIS — R600 Localized edema: Secondary | ICD-10-CM | POA: Insufficient documentation

## 2019-11-17 DIAGNOSIS — Z95828 Presence of other vascular implants and grafts: Secondary | ICD-10-CM

## 2019-11-17 DIAGNOSIS — F419 Anxiety disorder, unspecified: Secondary | ICD-10-CM | POA: Insufficient documentation

## 2019-11-17 DIAGNOSIS — E119 Type 2 diabetes mellitus without complications: Secondary | ICD-10-CM | POA: Diagnosis not present

## 2019-11-17 LAB — CBC WITH DIFFERENTIAL/PLATELET
Abs Immature Granulocytes: 0.03 10*3/uL (ref 0.00–0.07)
Basophils Absolute: 0 10*3/uL (ref 0.0–0.1)
Basophils Relative: 1 %
Eosinophils Absolute: 0 10*3/uL (ref 0.0–0.5)
Eosinophils Relative: 1 %
HCT: 30.6 % — ABNORMAL LOW (ref 36.0–46.0)
Hemoglobin: 10.2 g/dL — ABNORMAL LOW (ref 12.0–15.0)
Immature Granulocytes: 1 %
Lymphocytes Relative: 24 %
Lymphs Abs: 1.6 10*3/uL (ref 0.7–4.0)
MCH: 31.7 pg (ref 26.0–34.0)
MCHC: 33.3 g/dL (ref 30.0–36.0)
MCV: 95 fL (ref 80.0–100.0)
Monocytes Absolute: 0.6 10*3/uL (ref 0.1–1.0)
Monocytes Relative: 10 %
Neutro Abs: 4.3 10*3/uL (ref 1.7–7.7)
Neutrophils Relative %: 63 %
Platelets: 180 10*3/uL (ref 150–400)
RBC: 3.22 MIL/uL — ABNORMAL LOW (ref 3.87–5.11)
RDW: 15.4 % (ref 11.5–15.5)
WBC: 6.5 10*3/uL (ref 4.0–10.5)
nRBC: 0 % (ref 0.0–0.2)

## 2019-11-17 LAB — COMPREHENSIVE METABOLIC PANEL
ALT: 15 U/L (ref 0–44)
AST: 17 U/L (ref 15–41)
Albumin: 4.3 g/dL (ref 3.5–5.0)
Alkaline Phosphatase: 69 U/L (ref 38–126)
Anion gap: 9 (ref 5–15)
BUN: 22 mg/dL (ref 8–23)
CO2: 22 mmol/L (ref 22–32)
Calcium: 9.5 mg/dL (ref 8.9–10.3)
Chloride: 105 mmol/L (ref 98–111)
Creatinine, Ser: 0.96 mg/dL (ref 0.44–1.00)
GFR, Estimated: >60 mL/min (ref >60)
Glucose, Bld: 166 mg/dL — ABNORMAL HIGH (ref 70–99)
Potassium: 3.7 mmol/L (ref 3.5–5.1)
Sodium: 136 mmol/L (ref 135–145)
Total Bilirubin: 0.3 mg/dL (ref 0.3–1.2)
Total Protein: 7.3 g/dL (ref 6.5–8.1)

## 2019-11-17 MED ORDER — SODIUM CHLORIDE 0.9 % IV SOLN
150.0000 mg | Freq: Once | INTRAVENOUS | Status: AC
  Administered 2019-11-17: 150 mg via INTRAVENOUS
  Filled 2019-11-17: qty 5

## 2019-11-17 MED ORDER — SODIUM CHLORIDE 0.9 % IV SOLN
460.0000 mg | Freq: Once | INTRAVENOUS | Status: AC
  Administered 2019-11-17: 460 mg via INTRAVENOUS
  Filled 2019-11-17: qty 46

## 2019-11-17 MED ORDER — SODIUM CHLORIDE 0.9 % IV SOLN
10.0000 mg | Freq: Once | INTRAVENOUS | Status: AC
  Administered 2019-11-17: 10 mg via INTRAVENOUS
  Filled 2019-11-17: qty 10

## 2019-11-17 MED ORDER — DIPHENHYDRAMINE HCL 50 MG/ML IJ SOLN
INTRAMUSCULAR | Status: AC
  Filled 2019-11-17: qty 1

## 2019-11-17 MED ORDER — FAMOTIDINE IN NACL 20-0.9 MG/50ML-% IV SOLN
INTRAVENOUS | Status: AC
  Filled 2019-11-17: qty 50

## 2019-11-17 MED ORDER — PEGFILGRASTIM-JMDB 6 MG/0.6ML ~~LOC~~ SOSY
PREFILLED_SYRINGE | SUBCUTANEOUS | Status: AC
  Filled 2019-11-17: qty 0.6

## 2019-11-17 MED ORDER — LORAZEPAM 2 MG/ML IJ SOLN
0.5000 mg | Freq: Once | INTRAMUSCULAR | Status: AC
  Administered 2019-11-17: 0.5 mg via INTRAVENOUS
  Filled 2019-11-17: qty 1

## 2019-11-17 MED ORDER — FAMOTIDINE IN NACL 20-0.9 MG/50ML-% IV SOLN
20.0000 mg | Freq: Once | INTRAVENOUS | Status: AC
  Administered 2019-11-17: 20 mg via INTRAVENOUS

## 2019-11-17 MED ORDER — DIPHENHYDRAMINE HCL 50 MG/ML IJ SOLN
50.0000 mg | Freq: Once | INTRAMUSCULAR | Status: AC
  Administered 2019-11-17: 50 mg via INTRAVENOUS

## 2019-11-17 MED ORDER — PEGFILGRASTIM 6 MG/0.6ML ~~LOC~~ PSKT
6.0000 mg | PREFILLED_SYRINGE | Freq: Once | SUBCUTANEOUS | Status: AC
  Administered 2019-11-17: 6 mg via SUBCUTANEOUS

## 2019-11-17 MED ORDER — SODIUM CHLORIDE 0.9 % IV SOLN
140.0000 mg/m2 | Freq: Once | INTRAVENOUS | Status: AC
  Administered 2019-11-17: 282 mg via INTRAVENOUS
  Filled 2019-11-17: qty 47

## 2019-11-17 MED ORDER — PEGFILGRASTIM 6 MG/0.6ML ~~LOC~~ PSKT
PREFILLED_SYRINGE | SUBCUTANEOUS | Status: AC
  Filled 2019-11-17: qty 0.6

## 2019-11-17 MED ORDER — SODIUM CHLORIDE 0.9% FLUSH
10.0000 mL | INTRAVENOUS | Status: DC | PRN
  Administered 2019-11-17: 10 mL

## 2019-11-17 MED ORDER — HEPARIN SOD (PORK) LOCK FLUSH 100 UNIT/ML IV SOLN
500.0000 [IU] | Freq: Once | INTRAVENOUS | Status: AC | PRN
  Administered 2019-11-17: 500 [IU]

## 2019-11-17 MED ORDER — PALONOSETRON HCL INJECTION 0.25 MG/5ML
0.2500 mg | Freq: Once | INTRAVENOUS | Status: AC
  Administered 2019-11-17: 0.25 mg via INTRAVENOUS

## 2019-11-17 MED ORDER — SODIUM CHLORIDE 0.9 % IV SOLN: Freq: Once | INTRAVENOUS | Status: AC

## 2019-11-17 MED ORDER — PALONOSETRON HCL INJECTION 0.25 MG/5ML
INTRAVENOUS | Status: AC
  Filled 2019-11-17: qty 5

## 2019-11-17 NOTE — Progress Notes (Signed)
Patient presents today for treatment and follow up visit with Dr. Delton Coombes. Labs reviewed. Message received to proceed with treatment today.  Message received from Dr. Delton Coombes. Give 0.5mg  of Ativan IV push x 1 dose now. Patient has complaints of restlessness in her legs and arms .   Treatment given today per MD orders. Tolerated infusion without adverse affects. Vital signs stable. No complaints at this time. Discharged from clinic ambulatory in stable condition. Alert and oriented x 3. F/U with [facility] as scheduled.

## 2019-11-17 NOTE — Patient Instructions (Addendum)
[facility] Discharge Instructions  You were seen today by Dr. Delton Coombes. He went over your recent results. You received your treatment today. You will be scheduled for a PET scan before your next visit. Dr. Delton Coombes will see you back in 4 weeks for labs and follow up.   Thank you for choosing [facility] at [facility] to provide your oncology and hematology care.  To afford each patient quality time with our provider, please arrive at least 15 minutes before your scheduled appointment time.   If you have a lab appointment with the [facility] please come in thru the Main Entrance and check in at the main information desk  You need to re-schedule your appointment should you arrive 10 or more minutes late.  We strive to give you quality time with our providers, and arriving late affects you and other patients whose appointments are after yours.  Also, if you no show three or more times for appointments you may be dismissed from the clinic at the providers discretion.     Again, thank you for choosing [facility].  Our hope is that these requests will decrease the amount of time that you wait before being seen by our physicians.       _____________________________________________________________  Should you have questions after your visit to [facility], please contact our office at [phone] between the hours of 8:00 a.m. and 4:30 p.m.  Voicemails left after 4:00 p.m. will not be returned until the following business day.  For prescription refill requests, have your pharmacy contact our office and allow 72 hours.    Cancer Center Support Programs:   > Cancer Support Group  2nd Tuesday of the month 1pm-2pm, Journey Room

## 2019-11-17 NOTE — Progress Notes (Signed)
[place] Desha, Laura [name]   CLINIC:  Medical Oncology/Hematology  PCP:  Biagio Borg, MD [place] / [place] [address] [phone]   REASON FOR VISIT:  Follow-up for ovarian cancer  PRIOR THERAPY: None  NGS Results: Foundation 1 MS--stable  CURRENT THERAPY: Carboplatin and paclitaxel every 3 weeks  BRIEF ONCOLOGIC HISTORY:  Oncology History  Primary ovarian adenocarcinoma, unspecified laterality ([place])  06/20/2019 Initial Diagnosis   Primary ovarian adenocarcinoma, unspecified laterality ([place])   07/01/2019 Genetic Testing   Foundation One     07/11/2019 Genetic Testing   Negative genetic testing:  No pathogenic variants detected on the [facility] Multi-Cancer Panel. The report date is 07/11/2019.  The Multi-Cancer Panel offered by [facility] includes sequencing and/or deletion duplication testing of the following 85 genes: AIP, ALK, APC, ATM, AXIN2,BAP1,  BARD1, BLM, BMPR1A, BRCA1, BRCA2, BRIP1, CASR, CDC73, CDH1, CDK4, CDKN1B, CDKN1C, CDKN2A (p14ARF), CDKN2A (p16INK4a), CEBPA, CHEK2, CTNNA1, DICER1, DIS3L2, EGFR (c.2369C>T, p.Thr790Met variant only), EPCAM (Deletion/duplication testing only), FH, FLCN, GATA2, GPC3, GREM1 (Promoter region deletion/duplication testing only), HOXB13 (c.251G>A, p.Gly84Glu), HRAS, KIT, MAX, MEN1, MET, MITF (c.952G>A, p.Glu318Lys variant only), MLH1, MSH2, MSH3, MSH6, MUTYH, NBN, NF1, NF2, NTHL1, PALB2, PDGFRA, PHOX2B, PMS2, POLD1, POLE, POT1, PRKAR1A, PTCH1, PTEN, RAD50, RAD51C, RAD51D, RB1, RECQL4, RET, RNF43, RUNX1, SDHAF2, SDHA (sequence changes only), SDHB, SDHC, SDHD, SMAD4, SMARCA4, SMARCB1, SMARCE1, STK11, SUFU, TERC, TERT, TMEM127, TP53, TSC1, TSC2, VHL, WRN and WT1.   07/28/2019 -  Chemotherapy   The patient had palonosetron (ALOXI) injection 0.25 mg, 0.25 mg, Intravenous,  Once, 5 of 6 cycles Administration: 0.25 mg (07/28/2019), 0.25 mg (08/18/2019), 0.25 mg (09/15/2019), 0.25 mg (10/06/2019), 0.25 mg  (10/27/2019) pegfilgrastim (NEULASTA ONPRO KIT) injection 6 mg, 6 mg, Subcutaneous, Once, 5 of 6 cycles Administration: 6 mg (07/28/2019), 6 mg (08/18/2019), 6 mg (09/15/2019), 6 mg (10/06/2019), 6 mg (10/27/2019) CARBOplatin (PARAPLATIN) 470 mg in sodium chloride 0.9 % 250 mL chemo infusion, 470 mg (100 % of original dose 470 mg), Intravenous,  Once, 5 of 6 cycles Dose modification:   (original dose 470 mg, Cycle 1, Reason: Provider Judgment),   (original dose 568.2 mg, Cycle 2),   (original dose 460.5 mg, Cycle 3), 460 mg (original dose 460 mg, Cycle 4, Reason: Provider Judgment),   (original dose 473.5 mg, Cycle 5, Reason: Provider Judgment) Administration: 470 mg (07/28/2019), 570 mg (08/18/2019), 460 mg (09/15/2019), 460 mg (10/06/2019), 460 mg (10/27/2019) fosaprepitant (EMEND) 150 mg in sodium chloride 0.9 % 145 mL IVPB, 150 mg, Intravenous,  Once, 5 of 6 cycles Administration: 150 mg (07/28/2019), 150 mg (08/18/2019), 150 mg (09/15/2019), 150 mg (10/06/2019), 150 mg (10/27/2019) PACLitaxel (TAXOL) 282 mg in sodium chloride 0.9 % 250 mL chemo infusion (> 86m/m2), 140 mg/m2 = 282 mg (80 % of original dose 175 mg/m2), Intravenous,  Once, 5 of 6 cycles Dose modification: 140 mg/m2 (80 % of original dose 175 mg/m2, Cycle 1, Reason: Provider Judgment), 150 mg/m2 (original dose 175 mg/m2, Cycle 2, Reason: Provider Judgment), 140 mg/m2 (original dose 175 mg/m2, Cycle 3, Reason: Provider Judgment), 140 mg/m2 (original dose 175 mg/m2, Cycle 4, Reason: Other (see comments), Comment: fatigue) Administration: 282 mg (07/28/2019), 300 mg (08/18/2019), 282 mg (09/15/2019), 282 mg (10/06/2019), 282 mg (10/27/2019)  for chemotherapy treatment.      CANCER STAGING: Cancer Staging No matching staging information was found for the patient.  INTERVAL HISTORY:  [name] a 74y.o. female, returns for routine follow-up and  consideration for next cycle of chemotherapy. [name] was last seen on 10/27/2019.  Due for cycle #6 of  carboplatin and paclitaxel today.   Overall, she tells me she has been feeling pretty well. She tolerated the previous treatment well for several days until she started feeling fatigued 3 days after chemo, but the last 2 weeks have been great. Her taste is still slightly abnormal and she reports occasional numbness when she lies on her side. She was prescribed Mobic for tendonitis in her right ankle. She denies having abdominal pain, back pain or leg swelling.  Overall, she feels ready for next cycle of chemo today.    REVIEW OF SYSTEMS:  Review of Systems  Constitutional: Positive for appetite change (75%).  HENT:   Positive for mouth sores.   Gastrointestinal: Negative for abdominal pain.  Musculoskeletal: Negative for back pain.  Neurological: Positive for numbness (when lying on side).  All other systems reviewed and are negative.   PAST MEDICAL/SURGICAL HISTORY:  Past Medical History:  Diagnosis Date  . Anxiety   . Arthritis    back of neck, bones spurs on neck  . Cataract   . Cervical disc disease   . Diabetes mellitus   . Family history of thyroid cancer   . Hyperlipidemia   . Hypertension   . Mucoid cyst of joint    right thumb  . Neuropathy   . Port-A-Cath in place 07/21/2019  . Reflux   . Sleep apnea    wears CPAP nightly  . Vertigo    Past Surgical History:  Procedure Laterality Date  . BLADDER SURGERY    . BREAST EXCISIONAL BIOPSY Bilateral   . BREAST SURGERY    . CHOLECYSTECTOMY    . fibroids removed     breast (both breasts)  . IR IMAGING GUIDED PORT INSERTION  07/26/2019   right  . MASS EXCISION Right 06/26/2016   Procedure: EXCISION MUCOID TUMOR RIGHT THUMB, IP RIGHT THUMB;  Surgeon: Daryll Brod, MD;  Location: [facility];  Service: Orthopedics;  Laterality: Right;  . PARTIAL HYSTERECTOMY      SOCIAL HISTORY:  Social History   Socioeconomic History  . Marital status: Divorced    Spouse name: Not on file  . Number of children: 4  .  Years of education: 20  . Highest education level: Not on file  Occupational History  . Occupation: retired Geographical information systems officer  Tobacco Use  . Smoking status: Never Smoker  . Smokeless tobacco: Never Used  Vaping Use  . Vaping Use: Never used  Substance and Sexual Activity  . Alcohol use: No    Alcohol/week: 0.0 standard drinks  . Drug use: No  . Sexual activity: Not Currently  Other Topics Concern  . Not on file  Social History Narrative  . Not on file   Social Determinants of Health   Financial Resource Strain: Low Risk   . Difficulty of Paying Living Expenses: Not hard at all  Food Insecurity: No Food Insecurity  . Worried About Charity fundraiser in the Last Year: Never true  . Ran Out of Food in the Last Year: Never true  Transportation Needs: No Transportation Needs  . Lack of Transportation (Medical): No  . Lack of Transportation (Non-Medical): No  Physical Activity: Inactive  . Days of Exercise per Week: 0 days  . Minutes of Exercise per Session: 0 min  Stress: No Stress Concern Present  . Feeling of Stress : Not at all  Social Connections: Moderately Integrated  . Frequency of Communication with Friends and Family: More than three times a week  . Frequency of Social Gatherings with Friends and Family: More than three times a week  . Attends Religious Services: More than 4 times per year  . Active Member of Clubs or Organizations: Yes  . Attends Archivist Meetings: Never  . Marital Status: Divorced  Human resources officer Violence: Not At Risk  . Fear of Current or Ex-Partner: No  . Emotionally Abused: No  . Physically Abused: No  . Sexually Abused: No    FAMILY HISTORY:  Family History  Problem Relation Age of Onset  . Hypertension Other   . Diabetes Father   . Hypertension Father   . Diabetes Sister   . Hypertension Mother   . Hypertension Sister   . Stroke Sister   . Diabetes Maternal Aunt   . Thyroid cancer Daughter 55  . Colon  cancer Neg Hx   . Esophageal cancer Neg Hx   . Stomach cancer Neg Hx   . Rectal cancer Neg Hx   . Breast cancer Neg Hx   . Endometrial cancer Neg Hx   . Ovarian cancer Neg Hx     CURRENT MEDICATIONS:  Current Outpatient Medications  Medication Sig Dispense Refill  . Blood Glucose Monitoring Suppl (ONE TOUCH ULTRA 2) w/Device KIT Use as directed 1 each 0  . CARBOPLATIN IV Inject into the vein every 21 ( twenty-one) days.    . cetirizine (ZYRTEC) 10 MG tablet Take 10 mg by mouth daily.    . cholecalciferol (VITAMIN D3) 25 MCG (1000 UNIT) tablet Take 2,000 Units by mouth daily.    Marland Kitchen docusate sodium (COLACE) 100 MG capsule     . eszopiclone (LUNESTA) 2 MG TABS tablet Take 1 tablet (2 mg total) by mouth at bedtime as needed for sleep. Take immediately before bedtime 90 tablet 1  . glimepiride (AMARYL) 2 MG tablet TAKE ONE TABLET BY MOUTH DAILY 90 tablet 3  . Lancets (ONETOUCH ULTRASOFT) lancets USE TO CHECK BLOOD SUGAR TWICE DAILY 100 each 3  . loperamide (IMODIUM) 2 MG capsule Take by mouth as needed for diarrhea or loose stools.     Marland Kitchen LORazepam (ATIVAN) 0.5 MG tablet     . losartan-hydrochlorothiazide (HYZAAR) 100-12.5 MG tablet Take 1 tablet by mouth daily. 90 tablet 3  . meloxicam (MOBIC) 7.5 MG tablet Take 1 tablet (7.5 mg total) by mouth daily as needed for pain. 30 tablet 2  . morphine (MSIR) 15 MG tablet Take 1 tablet (15 mg total) by mouth 2 (two) times daily as needed for severe pain. 30 tablet 0  . ONETOUCH ULTRA test strip USE TO CHECK BLOOD SUGARS TWO TIMES DAILY 100 strip 3  . OVER THE COUNTER MEDICATION Apply 1 application topically daily as needed (pain). Triderma otc pain cream     . PACLITAXEL IV Inject 175 mg/m2 into the vein every 21 ( twenty-one) days.    . Pegfilgrastim (NEULASTA ONPRO [place]) Inject into the skin every 21 ( twenty-one) days.    Marland Kitchen solifenacin (VESICARE) 5 MG tablet Take 1 tablet (5 mg total) by mouth daily. 90 tablet 3  . vitamin B-12 (CYANOCOBALAMIN) 1000  MCG tablet Take 2,000 mcg by mouth daily.    Marland Kitchen Dextromethorphan-guaiFENesin (DELSYM COUGH/CHEST CONGEST DM) 5-100 MG/5ML LIQD      No current facility-administered medications for this visit.    ALLERGIES:  Allergies  Allergen Reactions  . Ciprofloxacin Swelling  .  Hydrocodone-Homatropine Other (See Comments)    Vertigo *pt strongly prefers to never take*  . Sulfa Antibiotics Hives, Itching and Swelling    Tongue swells  . Augmentin [Amoxicillin-Pot Clavulanate]     Diarrhea; can take PCN/ Amoxicillin  . Codeine Itching  . Crestor [Rosuvastatin Calcium] Other (See Comments)    Did something to memory    . Doxycycline Other (See Comments)    Severe rectal Gas.  . Gabapentin     disoriented  . Keflex [Cephalexin] Diarrhea and Nausea And Vomiting  . Naproxen Other (See Comments)    Stomach cramps  . Statins     Muscle weakness  . Prednisone Anxiety    *pt strongly prefers to never be given prednisone*     PHYSICAL EXAM:  Performance status (ECOG): 1 - Symptomatic but completely ambulatory  Vitals:   11/17/19 0908  BP: (!) 190/79  Pulse: 90  Resp: 18  Temp: (!) 97 F (36.1 C)  SpO2: 98%   Wt Readings from Last 3 Encounters:  11/17/19 187 lb 1.6 oz (84.9 kg)  11/11/19 186 lb (84.4 kg)  10/27/19 186 lb 6.4 oz (84.6 kg)   Physical Exam Vitals reviewed.  Constitutional:      Appearance: Normal appearance. She is obese.  Cardiovascular:     Rate and Rhythm: Normal rate and regular rhythm.     Pulses: Normal pulses.     Heart sounds: Normal heart sounds.  Pulmonary:     Effort: Pulmonary effort is normal.     Breath sounds: Normal breath sounds.  Abdominal:     Palpations: Abdomen is soft.     Tenderness: There is no abdominal tenderness.  Musculoskeletal:     Right lower leg: No edema.     Left lower leg: No edema.  Neurological:     General: No focal deficit present.     Mental Status: She is alert and oriented to person, place, and time.  Psychiatric:         Mood and Affect: Mood normal.        Behavior: Behavior normal.     LABORATORY DATA:  I have reviewed the labs as listed.  CBC Latest Ref Rng & Units 11/17/2019 10/27/2019 10/06/2019  WBC 4.0 - 10.5 K/uL 6.5 5.7 6.0  Hemoglobin 12.0 - 15.0 g/dL 10.2(L) 10.5(L) 10.7(L)  Hematocrit 36 - 46 % 30.6(L) 30.6(L) 32.1(L)  Platelets 150 - 400 K/uL 180 152 178   CMP Latest Ref Rng & Units 11/17/2019 10/27/2019 10/06/2019  Glucose 70 - 99 mg/dL 166(H) 138(H) 172(H)  BUN 8 - 23 mg/dL _0 Creatinine 0.44 - 1.00 mg/dL 0.96 0.87 1.10(H)  Sodium 135 - 145 mmol/L 136 135 136  Potassium 3.5 - 5.1 mmol/L 3.7 3.6 3.6  Chloride 98 - 111 mmol/L 105 103 103  CO2 22 - 32 mmol/L _1 Calcium 8.9 - 10.3 mg/dL 9.5 9.8 9.8  Total Protein 6.5 - 8.1 g/dL 7.3 7.1 7.1  Total Bilirubin 0.3 - 1.2 mg/dL 0.3 0.7 0.6  Alkaline Phos 38 - 126 U/L 69 64 60  AST 15 - 41 U/L 17 14(L) 17  ALT 0 - 44 U/L _2 DIAGNOSTIC IMAGING:  I have independently reviewed the scans and discussed with the patient. No results found.   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 Mass measures 6  x 5.8 cm and is partially calcified. Separate solid component superior to this in the left periaortic region measures 3.5 cm, SUV 14.3. Cystic component medial to the lower pole of the left kidney is without hypermetabolic activity, possibly a lymphocele. -CT-guided biopsy of the right retroperitoneal lymph node consistent with adenocarcinoma with psammoma bodies. Morphology and immunotherapy consistent with ovarian serous carcinoma/primary peritoneal carcinoma. -Germline mutation testing was negative. -Foundation 1 testing shows MS-stable. Loss of heterozygosity was less than 16%. -She met with Dr. Denman George who recommended neoadjuvant chemotherapywith 3-6 cycles of carboplatin  and paclitaxel with interim staging after 3 cycles. If there is substantial response unless encasement of the aorta and vena cava, consideration for debulking procedure can be possible at that time. -CA-125 346 on 06/23/2019. -Cycle 1 of carboplatin and paclitaxel on 07/28/2019. -CTAP on 9/21/2021after 3 cycles of chemotherapy showed retroperitoneal adenopathy at the bifurcation measuring 5.2 x 2.9 cm, left para-aortic lymphadenopathy measuring 5 x 4.4 cm, both of them decreased in size when compared to most recent PET scan. CT scan report says that one of the lesion has gotten bigger but this was compared to CT scan from 04/02/2019.   PLAN:  1. Ovarian serous carcinoma: -She had tiredness which lasted about a week after last treatment.  Last 2 weeks have been good. -She had some urinary symptoms and had UA checked which was negative for infection.  Symptoms resolved. -I have reviewed her labs today.  LFTs are normal.  Bilirubin is normal.  Mild anemia with hemoglobin 10.2 from chemotherapy.  CA-125 from today is pending.  Last CA-125 was 17.6. -She will proceed with cycle 6 with dose reductions of carboplatin and paclitaxel. -We will schedule her for a PET CT scan.  We will also schedule her for follow-up with Dr. Denman George.  She will be reevaluated in 4 weeks.  2. Low back pain: -She is not requiring pain medications as the pain gotten better since the start of treatment.  3. Constipation: -Continue Senokot 1 tablet daily.  4. Hypertension: -Continue Norvasc, losartan/HCTZ.  5. Leg swelling: -Continue Lasix as needed.  6. Difficulty sleeping: -Continue lorazepam as needed.  7. Bladder spasms: -Continue Vesicare as needed.   Orders placed this encounter:  No orders of the defined types were placed in this encounter.    Derek Jack, MD [place] [phone]   I, Milinda Antis, am acting as a scribe for Dr. Sanda Linger.  I,  Derek Jack MD, have reviewed the above documentation for accuracy and completeness, and I agree with the above.

## 2019-11-17 NOTE — Patient Instructions (Signed)
[facility] at [facility] Discharge Instructions  Labs drawn from portacath today   Thank you for choosing [facility] at [facility] to provide your oncology and hematology care.  To afford each patient quality time with our provider, please arrive at least 15 minutes before your scheduled appointment time.   If you have a lab appointment with the Cancer Center please come in thru the Main Entrance and check in at the main information desk.  You need to re-schedule your appointment should you arrive 10 or more minutes late.  We strive to give you quality time with our providers, and arriving late affects you and other patients whose appointments are after yours.  Also, if you no show three or more times for appointments you may be dismissed from the clinic at the providers discretion.     Again, thank you for choosing [facility].  Our hope is that these requests will decrease the amount of time that you wait before being seen by our physicians.       _____________________________________________________________  Should you have questions after your visit to [facility], please contact our office at [phone] and follow the prompts.  Our office hours are 8:00 a.m. and 4:30 p.m. Monday - Friday.  Please note that voicemails left after 4:00 p.m. may not be returned until the following business day.  We are closed weekends and major holidays.  You do have access to a nurse 24-7, just call the main number to the clinic [phone] and do not press any options, hold on the line and a nurse will answer the phone.    For prescription refill requests, have your pharmacy contact our office and allow 72 hours.    Due to Covid, you will need to wear a mask upon entering the hospital. If you do not have a mask, a mask will be given to you at the Main Entrance upon arrival. For doctor visits, patients may have 1 support person age 18  or older with them. For treatment visits, patients can not have anyone with them due to social distancing guidelines and our immunocompromised population.     

## 2019-11-17 NOTE — Progress Notes (Signed)
Patient was assessed by Dr. Katragadda and labs have been reviewed.  Patient is okay to proceed with treatment today. Primary RN and pharmacy aware.   

## 2019-11-17 NOTE — Patient Instructions (Signed)
[facility] Discharge Instructions for Patients Receiving Chemotherapy  Today you received the following chemotherapy agents   To help prevent nausea and vomiting after your treatment, we encourage you to take your nausea medication   If you develop nausea and vomiting that is not controlled by your nausea medication, call the clinic.   BELOW ARE SYMPTOMS THAT SHOULD BE REPORTED IMMEDIATELY:  *FEVER GREATER THAN 100.5 F  *CHILLS WITH OR WITHOUT FEVER  NAUSEA AND VOMITING THAT IS NOT CONTROLLED WITH YOUR NAUSEA MEDICATION  *UNUSUAL SHORTNESS OF BREATH  *UNUSUAL BRUISING OR BLEEDING  TENDERNESS IN MOUTH AND THROAT WITH OR WITHOUT PRESENCE OF ULCERS  *URINARY PROBLEMS  *BOWEL PROBLEMS  UNUSUAL RASH Items with * indicate a potential emergency and should be followed up as soon as possible.  Feel free to call the clinic should you have any questions or concerns. The clinic phone number is [phone].  Please show the CHEMO ALERT CARD at check-in to the Emergency Department and triage nurse.   

## 2019-11-18 ENCOUNTER — Ambulatory Visit (HOSPITAL_COMMUNITY): Payer: Medicare Other

## 2019-11-18 LAB — CA 125: Cancer Antigen (CA) 125: 15.2 U/mL (ref 0.0–38.1)

## 2019-11-18 NOTE — Progress Notes (Signed)
Nutrition Follow-up:   Patient with ovarian cancer.  Patient receiving neoadjuvant chemotherapy.   Spoke with patient via phone for nutrition follow-up.  Patient reports that she completed last treatment yesterday.  Planning PET scan and then determination regarding surgery.  Patient reports appetite is usually not that good following treatment but usually the 3rd week appetite picks back up.  She reports sleeping better and really enjoying activa yogurt.  Drinking shakes.      Medications: reviewed  Labs: reviewed  Anthropometrics:   Weight is 187 lb 1.6 oz on 11/4 increased from 184 lb  On 9/29   NUTRITION DIAGNOSIS: Inadequate oral intake improved   INTERVENTION:  Encouraged patient to continue eating well-balanced diet with good sources of protein.   Encouraged oral nutrition supplements as able Patient has contact information and will contact RD if changes in nutritional status occur   NEXT VISIT: no follow-up.  Patient will reach out to RD if needed  Laura [name] B. Zenia Resides, [place], [place] Registered Dietitian [phone] (mobile)

## 2019-11-20 ENCOUNTER — Encounter (HOSPITAL_COMMUNITY): Payer: Self-pay

## 2019-11-23 ENCOUNTER — Encounter: Payer: Self-pay | Admitting: Internal Medicine

## 2019-11-24 ENCOUNTER — Other Ambulatory Visit: Payer: Self-pay

## 2019-11-24 ENCOUNTER — Ambulatory Visit
Admission: RE | Admit: 2019-11-24 | Discharge: 2019-11-24 | Disposition: A | Discharge status: Home / Self Care | Payer: Medicare Other | Source: Ambulatory Visit | Attending: Internal Medicine | Admitting: Internal Medicine

## 2019-11-24 DIAGNOSIS — Z1231 Encounter for screening mammogram for malignant neoplasm of breast: Secondary | ICD-10-CM

## 2019-11-25 ENCOUNTER — Telehealth: Payer: Self-pay | Admitting: Oncology

## 2019-11-25 NOTE — Telephone Encounter (Signed)
Mal Amabile with appointment to see Dr. Denman George on 12/13/19 at 12:30.  She verbalized understanding and agreement.

## 2019-11-28 ENCOUNTER — Encounter: Payer: Self-pay | Admitting: Internal Medicine

## 2019-11-30 ENCOUNTER — Encounter (HOSPITAL_COMMUNITY): Payer: Self-pay

## 2019-11-30 NOTE — Progress Notes (Signed)
Patient called the clinic reporting anxiety and restlessness that has been going on for the past few days. Patient reports that she has a prescription for ativan that was previously prescribed by her PCP and was wondering if she could utilize that. I encourage the patient to utilize it, especially if she has gotten relief from it in the past. I encouraged her to call back with any further questions or concerns. MD made aware of this conversation.

## 2019-12-01 ENCOUNTER — Other Ambulatory Visit (HOSPITAL_COMMUNITY): Payer: Self-pay

## 2019-12-01 MED ORDER — ALPRAZOLAM 0.25 MG PO TABS: 0.2500 mg | ORAL_TABLET | Freq: Every evening | ORAL | 5 refills | Status: DC | PRN

## 2019-12-05 ENCOUNTER — Encounter (HOSPITAL_COMMUNITY)
Admission: RE | Admit: 2019-12-05 | Discharge: 2019-12-05 | Disposition: A | Discharge status: Home / Self Care | Payer: Medicare Other | Source: Ambulatory Visit | Attending: Hematology | Admitting: Hematology

## 2019-12-05 ENCOUNTER — Encounter: Payer: Self-pay | Admitting: Internal Medicine

## 2019-12-05 ENCOUNTER — Other Ambulatory Visit: Payer: Self-pay

## 2019-12-05 ENCOUNTER — Encounter (HOSPITAL_COMMUNITY): Payer: Self-pay

## 2019-12-05 DIAGNOSIS — C569 Malignant neoplasm of unspecified ovary: Secondary | ICD-10-CM | POA: Insufficient documentation

## 2019-12-05 DIAGNOSIS — M25579 Pain in unspecified ankle and joints of unspecified foot: Secondary | ICD-10-CM

## 2019-12-05 DIAGNOSIS — I700 Atherosclerosis of aorta: Secondary | ICD-10-CM | POA: Diagnosis not present

## 2019-12-05 DIAGNOSIS — N8110 Cystocele, unspecified: Secondary | ICD-10-CM | POA: Diagnosis not present

## 2019-12-05 DIAGNOSIS — I2510 Atherosclerotic heart disease of native coronary artery without angina pectoris: Secondary | ICD-10-CM | POA: Diagnosis not present

## 2019-12-05 MED ORDER — FLUDEOXYGLUCOSE F - 18 (FDG) INJECTION
14.7000 | Freq: Once | INTRAVENOUS | Status: AC | PRN
  Administered 2019-12-05: 14.7 via INTRAVENOUS

## 2019-12-06 ENCOUNTER — Telehealth (HOSPITAL_COMMUNITY): Payer: Self-pay | Admitting: *Deleted

## 2019-12-06 ENCOUNTER — Other Ambulatory Visit: Payer: Self-pay

## 2019-12-06 NOTE — Telephone Encounter (Signed)
I spoke with patient over the telephone. I apologized for my message offending her in any way. I explained that I did not mean for it to come across the way it was taken.  I explained the rationale for Dr. Delton Coombes prescribing her xanax and not ativan.  I gave her the opportunity to explain why she is taking ativan and she said she doesn't want to take it, she was just trying it since she had it on hand.  It does help her but she prefers not to take it.  She verbalized ease of her nerves and desire to listen to the recommendations of the provider.  She said that she is going to take the xanax and call us back to let us know if it is helping her restlessness/anxiety.  I apologized again for the misunderstanding.  At this time she is satisfied and all questions are answered.

## 2019-12-06 NOTE — Telephone Encounter (Signed)
   Patient states [facility] doesn't have an appointment until mid January

## 2019-12-07 ENCOUNTER — Ambulatory Visit (HOSPITAL_COMMUNITY): Payer: Medicare Other | Admitting: Hematology

## 2019-12-07 ENCOUNTER — Ambulatory Visit (HOSPITAL_COMMUNITY): Payer: Medicare Other

## 2019-12-07 ENCOUNTER — Ambulatory Visit (INDEPENDENT_AMBULATORY_CARE_PROVIDER_SITE_OTHER): Payer: Medicare Other

## 2019-12-07 ENCOUNTER — Ambulatory Visit (INDEPENDENT_AMBULATORY_CARE_PROVIDER_SITE_OTHER): Payer: Medicare Other | Admitting: Internal Medicine

## 2019-12-07 ENCOUNTER — Other Ambulatory Visit (HOSPITAL_COMMUNITY): Payer: Medicare Other

## 2019-12-07 ENCOUNTER — Encounter: Payer: Self-pay | Admitting: Internal Medicine

## 2019-12-07 DIAGNOSIS — M25571 Pain in right ankle and joints of right foot: Secondary | ICD-10-CM

## 2019-12-07 DIAGNOSIS — E1140 Type 2 diabetes mellitus with diabetic neuropathy, unspecified: Secondary | ICD-10-CM

## 2019-12-07 DIAGNOSIS — E559 Vitamin D deficiency, unspecified: Secondary | ICD-10-CM

## 2019-12-07 DIAGNOSIS — I10 Essential (primary) hypertension: Secondary | ICD-10-CM

## 2019-12-07 NOTE — Progress Notes (Signed)
Subjective:    Patient ID: Laura [name], female    DOB: December 20, 1945, 74 y.o.   MRN: [id number]  HPI  Here with c/o pain and local swelling for 2 wks worsening to the right medial ankle area without fall, twisting, trauma.  Worse to first get up to walk, then better after a number of steps, just annoying as she is getting up to the BR numerous times including at night it seems as she is trying to drink 64 oz or more fluids daily as was recommended with taking her chemo.  Did forget to take her BP med this am, but usually has very good record of taking her meds.  Pt denies chest pain, increased sob or doe, wheezing, orthopnea, PND, increased LE swelling, palpitations, dizziness or syncope.  Pt denies new neurological symptoms such as new headache, or facial or extremity weakness or numbness  Did have urine checked recently due to the frequency but was neg and declines recheck today  cbgs have been in lowrer 100s Past Medical History:  Diagnosis Date  . Anxiety   . Arthritis    back of neck, bones spurs on neck  . Cataract   . Cervical disc disease   . Diabetes mellitus   . Family history of thyroid cancer   . Hyperlipidemia   . Hypertension   . Mucoid cyst of joint    right thumb  . Neuropathy   . Port-A-Cath in place 07/21/2019  . Reflux   . Sleep apnea    wears CPAP nightly  . Vertigo    Past Surgical History:  Procedure Laterality Date  . BLADDER SURGERY    . BREAST EXCISIONAL BIOPSY Bilateral   . BREAST SURGERY    . CHOLECYSTECTOMY    . fibroids removed     breast (both breasts)  . IR IMAGING GUIDED PORT INSERTION  07/26/2019   right  . MASS EXCISION Right 06/26/2016   Procedure: EXCISION MUCOID TUMOR RIGHT THUMB, IP RIGHT THUMB;  Surgeon: Daryll Brod, MD;  Location: [place] [facility];  Service: Orthopedics;  Laterality: Right;  . PARTIAL HYSTERECTOMY      reports that she has never smoked. She has never used smokeless tobacco. She reports that she does not drink  alcohol and does not use drugs. family history includes Diabetes in her father, maternal aunt, and sister; Hypertension in her father, mother, sister, and another family member; Stroke in her sister; Thyroid cancer (age of onset: 59) in her daughter. Allergies  Allergen Reactions  . Ciprofloxacin Swelling  . Hydrocodone-Homatropine Other (See Comments)    Vertigo *pt strongly prefers to never take*  . Sulfa Antibiotics Hives, Itching and Swelling    Tongue swells  . Augmentin [Amoxicillin-Pot Clavulanate]     Diarrhea; can take PCN/ Amoxicillin  . Codeine Itching  . Crestor [Rosuvastatin Calcium] Other (See Comments)    Did something to memory    . Doxycycline Other (See Comments)    Severe rectal Gas.  . Gabapentin     disoriented  . Keflex [Cephalexin] Diarrhea and Nausea And Vomiting  . Naproxen Other (See Comments)    Stomach cramps  . Statins     Muscle weakness  . Prednisone Anxiety    *pt strongly prefers to never be given prednisone*    Current Outpatient Medications on File Prior to Visit  Medication Sig Dispense Refill  . ALPRAZolam (XANAX) 0.25 MG tablet Take 1 tablet (0.25 mg total) by mouth at bedtime as needed  for anxiety. 30 tablet 5  . Blood Glucose Monitoring Suppl (ONE TOUCH ULTRA 2) w/Device KIT Use as directed 1 each 0  . CARBOPLATIN IV Inject into the vein every 21 ( twenty-one) days.    . cetirizine (ZYRTEC) 10 MG tablet Take 10 mg by mouth daily.    . cholecalciferol (VITAMIN D3) 25 MCG (1000 UNIT) tablet Take 2,000 Units by mouth daily.    Marland Kitchen docusate sodium (COLACE) 100 MG capsule     . eszopiclone (LUNESTA) 2 MG TABS tablet Take 1 tablet (2 mg total) by mouth at bedtime as needed for sleep. Take immediately before bedtime 90 tablet 1  . glimepiride (AMARYL) 2 MG tablet TAKE ONE TABLET BY MOUTH DAILY 90 tablet 3  . Lancets (ONETOUCH ULTRASOFT) lancets USE TO CHECK BLOOD SUGAR TWICE DAILY 100 each 3  . loperamide (IMODIUM) 2 MG capsule Take by mouth as  needed for diarrhea or loose stools.     Marland Kitchen LORazepam (ATIVAN) 0.5 MG tablet     . losartan-hydrochlorothiazide (HYZAAR) 100-12.5 MG tablet Take 1 tablet by mouth daily. 90 tablet 3  . ONETOUCH ULTRA test strip USE TO CHECK BLOOD SUGARS TWO TIMES DAILY 100 strip 3  . OVER THE COUNTER MEDICATION Apply 1 application topically daily as needed (pain). Triderma otc pain cream     . PACLITAXEL IV Inject 175 mg/m2 into the vein every 21 ( twenty-one) days.    . Pegfilgrastim (NEULASTA ONPRO [place]) Inject into the skin every 21 ( twenty-one) days.    Marland Kitchen solifenacin (VESICARE) 5 MG tablet Take 1 tablet (5 mg total) by mouth daily. 90 tablet 3  . vitamin B-12 (CYANOCOBALAMIN) 1000 MCG tablet Take 2,000 mcg by mouth daily.     No current facility-administered medications on file prior to visit.   Review of Systems All otherwise neg per pt    Objective:   Physical Exam BP (!) 180/70 (BP Location: Left Arm, Patient Position: Sitting, Cuff Size: Large)   Pulse 84   Temp 98.7 F (37.1 C) (Oral)   Ht 5' 4"  (1.626 m)   Wt 187 lb (84.8 kg)   SpO2 98%   BMI 32.10 kg/m  VS noted,  Constitutional: Pt appears in NAD HENT: Head: NCAT.  Right Ear: External ear normal.  Left Ear: External ear normal.  Eyes: . Pupils are equal, round, and reactive to light. Conjunctivae and EOM are normal Nose: without d/c or deformity Neck: Neck supple. Gross normal ROM Cardiovascular: Normal rate and regular rhythm.   Pulmonary/Chest: Effort normal and breath sounds without rales or wheezing.  Abd:  Soft, NT, ND, + BS, no organomegaly Right ankle with from but has non discrete swellin and tender to the area just post to the medial malleolus , o/w neurovasc intact Neurological: Pt is alert. At baseline orientation, motor grossly intact Skin: Skin is warm. No rashes, other new lesions, no LE edema Psychiatric: Pt behavior is normal without agitation  All otherwise neg per pt Lab Results  Component Value Date   WBC 6.5  11/17/2019   HGB 10.2 (L) 11/17/2019   HCT 30.6 (L) 11/17/2019   PLT 180 11/17/2019   GLUCOSE 166 (H) 11/17/2019   CHOL 281 (H) 08/25/2018   TRIG 238.0 (H) 08/25/2018   HDL 52.20 08/25/2018   LDLDIRECT 207.0 08/25/2018   LDLCALC 166 (H) 04/12/2014   ALT 15 11/17/2019   AST 17 11/17/2019   NA 136 11/17/2019   K 3.7 11/17/2019   CL 105 11/17/2019  CREATININE 0.96 11/17/2019   BUN 22 11/17/2019   CO2 22 11/17/2019   TSH 1.65 03/09/2018   INR 1.0 06/06/2019   HGBA1C 6.5 11/11/2019   MICROALBUR 0.7 03/09/2018      Assessment & Plan:

## 2019-12-07 NOTE — Patient Instructions (Signed)
Please continue all other medications as before, and refills have been done if requested.  Please have the pharmacy call with any other refills you may need.  Please continue your efforts at being more active, low cholesterol diet, and weight control.  Please keep your appointments with your specialists as you may have planned  You will be contacted regarding the referral for: sports medicine  Please go to the XRAY Department in the first floor for the x-ray testing  You will be contacted by phone if any changes need to be made immediately.  Otherwise, you will receive a letter about your results with an explanation, but please check with MyChart first.  Please remember to sign up for MyChart if you have not done so, as this will be important to you in the future with finding out test results, communicating by private email, and scheduling acute appointments online when needed.

## 2019-12-08 ENCOUNTER — Encounter: Payer: Self-pay | Admitting: Internal Medicine

## 2019-12-08 NOTE — Assessment & Plan Note (Signed)
To restart meds, o/w stable overall by history and exam, recent data reviewed with pt, and pt to continue medical treatment as before,  to f/u any worsening symptoms or concerns

## 2019-12-08 NOTE — Assessment & Plan Note (Signed)
stable overall by history and exam, recent data reviewed with pt, and pt to continue medical treatment as before,  to f/u any worsening symptoms or concerns  

## 2019-12-08 NOTE — Assessment & Plan Note (Addendum)
By exam this is most c/w tendonitis, though etiology not clear.  For xray r/o fx or other today, but o/w refer sport med, and consider otc ankle brace  I spent 31 minutes in preparing to see the patient by review of recent labs, imaging and procedures, obtaining and reviewing separately obtained history, communicating with the patient and family or caregiver, ordering medications, tests or procedures, and documenting clinical information in the EHR including the differential Dx, treatment, and any further evaluation and other management of right ankle pain, htn, dm, vit d def

## 2019-12-08 NOTE — Assessment & Plan Note (Signed)
Cont oral 2000 qd

## 2019-12-12 ENCOUNTER — Encounter: Payer: Self-pay | Admitting: Gynecologic Oncology

## 2019-12-13 ENCOUNTER — Other Ambulatory Visit: Payer: Self-pay

## 2019-12-13 ENCOUNTER — Encounter: Payer: Self-pay | Admitting: Gynecologic Oncology

## 2019-12-13 ENCOUNTER — Inpatient Hospital Stay: Payer: Medicare Other | Attending: Gynecologic Oncology | Admitting: Gynecologic Oncology

## 2019-12-13 ENCOUNTER — Other Ambulatory Visit: Payer: Self-pay | Admitting: Gynecologic Oncology

## 2019-12-13 VITALS — BP 156/60 | HR 96 | Temp 98.0°F | Resp 18 | Wt 187.0 lb

## 2019-12-13 DIAGNOSIS — C775 Secondary and unspecified malignant neoplasm of intrapelvic lymph nodes: Secondary | ICD-10-CM

## 2019-12-13 DIAGNOSIS — I10 Essential (primary) hypertension: Secondary | ICD-10-CM | POA: Insufficient documentation

## 2019-12-13 DIAGNOSIS — E1140 Type 2 diabetes mellitus with diabetic neuropathy, unspecified: Secondary | ICD-10-CM | POA: Diagnosis not present

## 2019-12-13 DIAGNOSIS — C801 Malignant (primary) neoplasm, unspecified: Secondary | ICD-10-CM | POA: Diagnosis not present

## 2019-12-13 DIAGNOSIS — Z7984 Long term (current) use of oral hypoglycemic drugs: Secondary | ICD-10-CM | POA: Insufficient documentation

## 2019-12-13 DIAGNOSIS — Z79899 Other long term (current) drug therapy: Secondary | ICD-10-CM | POA: Insufficient documentation

## 2019-12-13 DIAGNOSIS — F419 Anxiety disorder, unspecified: Secondary | ICD-10-CM | POA: Insufficient documentation

## 2019-12-13 DIAGNOSIS — E7800 Pure hypercholesterolemia, unspecified: Secondary | ICD-10-CM | POA: Insufficient documentation

## 2019-12-13 DIAGNOSIS — C569 Malignant neoplasm of unspecified ovary: Secondary | ICD-10-CM

## 2019-12-13 DIAGNOSIS — Z90710 Acquired absence of both cervix and uterus: Secondary | ICD-10-CM | POA: Diagnosis not present

## 2019-12-13 DIAGNOSIS — E1136 Type 2 diabetes mellitus with diabetic cataract: Secondary | ICD-10-CM | POA: Diagnosis not present

## 2019-12-13 NOTE — H&P (View-Only) (Signed)
Follow-up Note: Gyn-Onc  Consult was requested by Dr. Ellin Saba for the evaluation of Laura [name] 74 y.o. female  CC:  Chief Complaint  Patient presents with  . Primary ovarian adenocarcinoma, unspecified laterality [facility])    Assessment/Plan:  Laura [name]  is a 74 y.o.  year old with bulky retroperitoneal metastatic gynecologic high grade carcinoma (likely fallopian tube or ovarian primary). She has had good, partial response.  There appears to be low volume residual uptake in the para-aortic region.  On my evaluation, this does not appear to be intimately involved with the vessels themselves.  A substantial amount of this tissue appears to be necrotic.  She is very symptomatic from her chemotherapy I think she would benefit from a break from systemic therapy.  I feel that she is amenable based on my review of the imaging to a debulking procedure of the para-aortic nodes.  We will perform a BSO at the same procedure.  Due to her relatively short stature this would be technically challenging, however I do feel that a minimally invasive approach with robotic assistance is feasible.  I explained to the patient that I could not guarantee we would not have to convert to laparotomy to safely complete the procedure particularly if there was bleeding encountered.  She understands this.  However if we were able to accomplish the surgery through minimally invasive route it would substantially expedite her recovery.  I discussed alternative options at this point would either be suspension of chemotherapy and watchful monitoring with reinitiation of chemotherapy when there was elevation in her tumor marker or new growth on imaging.  Alternatively we could proceed with continued cytotoxic therapy until there was no residual FDG activity within the nodal basin.  I do not favor this latter option given how poorly she tolerated her 6 cycle of chemotherapy.  After considering her options she is agreeable  to proceeding with surgical debulking.  Surgery has been tentatively scheduled for January 03, 2020.  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   The patient's history began in March, 2021 when she began experiencing flank and abdominal pains. These were evaluated at [facility] with a CT abd/pelvis which showed mixed solid and cystic lesions within the retroperitoneum centered predominantly in the aortocaval interval and tracking into the right iliac chain.  The largest solid component appeared to measure 5.9 x 4.3 x 6.4 cm.  To the left of the aorta was an additional lesion measuring approximately 4 x 3.6 x 4.6 cm.  The lesions partially encased to the abdominal aorta as well as the IMA origin, proximal right renal artery, and right common iliac.  There was compression of the IVC with loss of clearly discernible fat plane and a small segment concerning for possible intraluminal extension.  The uterus was surgically absent and there were no concerning adnexal masses seen.  No obvious distant metastatic disease was identified.  There was severe right hydroureter ureter nephrosis to the level of the mid ureter.  This was likely secondary to occlusion by the retroperitoneal node masses.  There was moderate left hydronephrosis possibly secondary to a distended urinary bladder versus compression from the left nodal mass.  She subsequently was seen and evaluated by oncologist, Dr Ellin Saba, who performed a PET scan on May 17, 2019.  This revealed the  previously demonstrated retroperitoneal masses which were hypermetabolic consistent with malignancy.  There is probable associated partial obstruction of the right mid ureter.  There was no hypermetabolic activity within the neck or chest.  There was no hypermetabolic areas within the pelvis.  An IR guided  lymph node biopsy was performed on Jun 03, 2019 and revealed adenocarcinoma with some antibodies.  The carcinoma had high-grade features with histology consistent with a possible serous carcinoma.  Immunostains were positive for cytokeratin 7, p53, PAX 8, and weak focal positivity for ER.  This immunostain profile was consistent with a gynecologic primary.  The patient was initially disinterested in chemotherapy and desired surgical resection, not understanding that both, and certainly chemotherapy, would be necessary for curative intent treatment.  The patient's medical history is remarkable for obesity with a BMI of 34 kg meters squared.  She has type 2 diabetes mellitus for which she is treated with oral medications.  She additionally has hypercholesterolemia and hypertension.  Her primary care physician is Dr. Cathlean Cower whom she sees 3-4 times per year.  Her surgical and gynecologic history is remarkable for a vaginal hysterectomy performed in her early 50s for benign uterine fibroids.  Her ovaries and fallopian tubes were not removed at that surgery.  She had a prior tubal ligation after pregnancies.  She had a laparoscopic cholecystectomy and vaginal bladder tacking with Dr. Gaynelle Arabian.  Her family history was remarkable only for daughter with a history of thyroid cancer.  The patient had seen Dr. Karsten Ro in the recent past for bladder discomfort symptoms for which she was treated medically.  She did not have stents placed.  Interval Hx:  She went on to receive 3 cycles of neoadjuvant carboplatin paclitaxel chemotherapy as she was deemed to not have surgically resectable disease at initial presentation due to the bulky and likely invasive nature of her retroperitoneal adenopathy.  She struggled somewhat with neoadjuvant chemotherapy requiring dose modifications.  After cycle 3 was administered she underwent a repeat CT scan of the abdomen and pelvis performed on October 04, 2019.  This revealed  that the retroperitoneal lymphadenopathy in the lower aortocaval space at the level of the bifurcations had decreased enhancement and a decrease in size measuring 5.2 x 2.9 cm (compared to 6 x 4.3 cm previously).  There was low-attenuation lymphadenopathy in the left parotid region with mild increase in size and index node measuring 5 x 4.4 cm which had slightly increased from 4.5 to 3.7 cm previously.  There were no new sites of lymphadenopathy identified.  Ca1 25 was drawn on October 06, 2019 which was day 1 of cycle 4.  This had normalized to 21.3 (prior to treatment in June 2021 this value was 346).  She continued chemotherapy receiving her 6 cycle on November 17, 2019.  The patient reported that the cycle was particularly difficult to tolerate.  She had substantial fatigue and weakness following the cycle of chemotherapy.  On November 17, 2019 which was day 1 of cycle 6 her Ca1 25 was normal at 15.2.  (It had normalized in September 2021).  PET restaging on December 06, 2019 showed improving periotic lymphadenopathy with the right para-aortic nodal conglomerate now measuring 2.4 x 4.8 cm in aggregate (it had previously been 2.6 x 5).  There was a very small region of FDG avidity within that area but mostly necrosis.  The area on the left parotic region was 4.8 x 4.2 cm and favored a benign retroperitoneal cyst versus lymphangioma as it  had been completely stable throughout therapy with no FDG uptake.  Current Meds:  Outpatient Encounter Medications as of 12/13/2019  Medication Sig  . ALPRAZolam (XANAX) 0.25 MG tablet Take 1 tablet (0.25 mg total) by mouth at bedtime as needed for anxiety.  . Blood Glucose Monitoring Suppl (ONE TOUCH ULTRA 2) w/Device KIT Use as directed  . cholecalciferol (VITAMIN D3) 25 MCG (1000 UNIT) tablet Take 2,000 Units by mouth daily.  Marland Kitchen docusate sodium (COLACE) 100 MG capsule   . eszopiclone (LUNESTA) 2 MG TABS tablet Take 1 tablet (2 mg total) by mouth at bedtime as  needed for sleep. Take immediately before bedtime  . glimepiride (AMARYL) 2 MG tablet TAKE ONE TABLET BY MOUTH DAILY  . Lancets (ONETOUCH ULTRASOFT) lancets USE TO CHECK BLOOD SUGAR TWICE DAILY  . loratadine (CLARITIN) 10 MG tablet Take 10 mg by mouth daily.  Marland Kitchen losartan-hydrochlorothiazide (HYZAAR) 100-12.5 MG tablet Take 1 tablet by mouth daily.  Glory Rosebush ULTRA test strip USE TO CHECK BLOOD SUGARS TWO TIMES DAILY  . OVER THE COUNTER MEDICATION Apply 1 application topically daily as needed (pain). Triderma otc pain cream   . solifenacin (VESICARE) 5 MG tablet Take 1 tablet (5 mg total) by mouth daily.  . vitamin B-12 (CYANOCOBALAMIN) 1000 MCG tablet Take 2,000 mcg by mouth daily.  Marland Kitchen CARBOPLATIN IV Inject into the vein every 21 ( twenty-one) days.  Marland Kitchen loperamide (IMODIUM) 2 MG capsule Take by mouth as needed for diarrhea or loose stools.  (Patient not taking: Reported on 12/12/2019)  . PACLITAXEL IV Inject 175 mg/m2 into the vein every 21 ( twenty-one) days.  . Pegfilgrastim (NEULASTA ONPRO [place]) Inject into the skin every 21 ( twenty-one) days.  . [DISCONTINUED] cetirizine (ZYRTEC) 10 MG tablet Take 10 mg by mouth daily.  . [DISCONTINUED] LORazepam (ATIVAN) 0.5 MG tablet    No facility-administered encounter medications on file as of 12/13/2019.    Allergy:  Allergies  Allergen Reactions  . Ciprofloxacin Swelling  . Hydrocodone-Homatropine Other (See Comments)    Vertigo *pt strongly prefers to never take*  . Sulfa Antibiotics Hives, Itching and Swelling    Tongue swells  . Augmentin [Amoxicillin-Pot Clavulanate]     Diarrhea; can take PCN/ Amoxicillin  . Codeine Itching  . Crestor [Rosuvastatin Calcium] Other (See Comments)    Did something to memory    . Doxycycline Other (See Comments)    Severe rectal Gas.  . Gabapentin     disoriented  . Keflex [Cephalexin] Diarrhea and Nausea And Vomiting  . Naproxen Other (See Comments)    Stomach cramps  . Statins     Muscle weakness   . Prednisone Anxiety    *pt strongly prefers to never be given prednisone*     Social Hx:   Social History   Socioeconomic History  . Marital status: Divorced    Spouse name: Not on file  . Number of children: 4  . Years of education: 5  . Highest education level: Not on file  Occupational History  . Occupation: retired Geographical information systems officer  Tobacco Use  . Smoking status: Never Smoker  . Smokeless tobacco: Never Used  Vaping Use  . Vaping Use: Never used  Substance and Sexual Activity  . Alcohol use: No    Alcohol/week: 0.0 standard drinks  . Drug use: No  . Sexual activity: Not Currently  Other Topics Concern  . Not on file  Social History Narrative  . Not on file   Social Determinants  of Health   Financial Resource Strain: Low Risk   . Difficulty of Paying Living Expenses: Not hard at all  Food Insecurity: No Food Insecurity  . Worried About Charity fundraiser in the Last Year: Never true  . Ran Out of Food in the Last Year: Never true  Transportation Needs: No Transportation Needs  . Lack of Transportation (Medical): No  . Lack of Transportation (Non-Medical): No  Physical Activity: Inactive  . Days of Exercise per Week: 0 days  . Minutes of Exercise per Session: 0 min  Stress: No Stress Concern Present  . Feeling of Stress : Not at all  Social Connections: Moderately Integrated  . Frequency of Communication with Friends and Family: More than three times a week  . Frequency of Social Gatherings with Friends and Family: More than three times a week  . Attends Religious Services: More than 4 times per year  . Active Member of Clubs or Organizations: Yes  . Attends Archivist Meetings: Never  . Marital Status: Divorced  Human resources officer Violence: Not At Risk  . Fear of Current or Ex-Partner: No  . Emotionally Abused: No  . Physically Abused: No  . Sexually Abused: No    Past Surgical Hx:  Past Surgical History:  Procedure Laterality Date   . ABDOMINAL HYSTERECTOMY     vaginal  . BLADDER SURGERY    . BREAST EXCISIONAL BIOPSY Bilateral   . BREAST SURGERY    . CHOLECYSTECTOMY    . fibroids removed     breast (both breasts)  . IR IMAGING GUIDED PORT INSERTION  07/26/2019   right  . MASS EXCISION Right 06/26/2016   Procedure: EXCISION MUCOID TUMOR RIGHT THUMB, IP RIGHT THUMB;  Surgeon: Daryll Brod, MD;  Location: [place];  Service: Orthopedics;  Laterality: Right;  . PARTIAL HYSTERECTOMY      Past Medical Hx:  Past Medical History:  Diagnosis Date  . Anxiety   . Arthritis    back of neck, bones spurs on neck  . Cataract   . Cervical disc disease   . Diabetes mellitus   . Family history of thyroid cancer   . Hyperlipidemia   . Hypertension   . Mucoid cyst of joint    right thumb  . Neuropathy   . Port-A-Cath in place 07/21/2019  . Reflux   . Sleep apnea    wears CPAP nightly  . Vertigo     Past Gynecological History:  See HPI No LMP recorded. Patient has had a hysterectomy.  Family Hx:  Family History  Problem Relation Age of Onset  . Hypertension Other   . Diabetes Father   . Hypertension Father   . Diabetes Sister   . Hypertension Mother   . Hypertension Sister   . Stroke Sister   . Diabetes Maternal Aunt   . Thyroid cancer Daughter 22  . Colon cancer Neg Hx   . Esophageal cancer Neg Hx   . Stomach cancer Neg Hx   . Rectal cancer Neg Hx   . Breast cancer Neg Hx   . Endometrial cancer Neg Hx   . Ovarian cancer Neg Hx     Review of Systems:  Constitutional  + fatigue and weakness ENT Normal appearing ears and nares bilaterally Skin/Breast  No rash, sores, jaundice, itching, dryness Cardiovascular  No chest pain, shortness of breath, or edema  Pulmonary  No cough or wheeze.  Gastro Intestinal  No nausea, vomitting, or diarrhoea. No  bright red blood per rectum, no abdominal pain, change in bowel movement, or constipation.  Genito Urinary  No frequency, urgency, dysuria,  denies hematuria Musculo Skeletal  No myalgia, arthralgia, joint swelling or pain  Neurologic  No weakness, numbness, change in gait,  Psychology  No depression, anxiety, insomnia.   Vitals:  Blood pressure (!) 156/60, pulse 96, temperature 98 F (36.7 C), temperature source Tympanic, resp. rate 18, weight 187 lb (84.8 kg), SpO2 100 %.  Physical Exam: WD in NAD Neck  Supple NROM, without any enlargements.  Lymph Node Survey No cervical supraclavicular or inguinal adenopathy Cardiovascular  Pulse normal rate, regularity and rhythm. S1 and S2 normal.  Lungs  Clear to auscultation bilateraly, without wheezes/crackles/rhonchi. Good air movement.  Skin  No rash/lesions/breakdown  Psychiatry  Alert and oriented to person, place, and time  Abdomen  Normoactive bowel sounds, abdomen soft, non-tender and obese without evidence of hernia.  Back No CVA tenderness Genito Urinary  Vulva/vagina: Normal external female genitalia.  No lesions. No discharge or bleeding.  Bladder/urethra:  No lesions or masses, well supported bladder  Vagina: smooth walled, no palpable masses in pelvis on bimanual exam. Surgically absent uterus. Rectal  deferred Extremities  No bilateral cyanosis, clubbing or edema.   Thereasa Solo, MD  12/13/2019, 12:38 PM

## 2019-12-13 NOTE — Progress Notes (Signed)
Follow-up Note: Gyn-Onc  Consult was requested by Dr. Delton Coombes for the evaluation of Laura [name] 74 y.o. female  CC:  Chief Complaint  Patient presents with  . Primary ovarian adenocarcinoma, unspecified laterality [facility])    Assessment/Plan:  Laura [name]  is a 74 y.o.  year old with bulky retroperitoneal metastatic gynecologic high grade carcinoma (likely fallopian tube or ovarian primary). She has had good, partial response.  There appears to be low volume residual uptake in the para-aortic region.  On my evaluation, this does not appear to be intimately involved with the vessels themselves.  A substantial amount of this tissue appears to be necrotic.  She is very symptomatic from her chemotherapy I think she would benefit from a break from systemic therapy.  I feel that she is amenable based on my review of the imaging to a debulking procedure of the para-aortic nodes.  We will perform a BSO at the same procedure.  Due to her relatively short stature this would be technically challenging, however I do feel that a minimally invasive approach with robotic assistance is feasible.  I explained to the patient that I could not guarantee we would not have to convert to laparotomy to safely complete the procedure particularly if there was bleeding encountered.  She understands this.  However if we were able to accomplish the surgery through minimally invasive route it would substantially expedite her recovery.  I discussed alternative options at this point would either be suspension of chemotherapy and watchful monitoring with reinitiation of chemotherapy when there was elevation in her tumor marker or new growth on imaging.  Alternatively we could proceed with continued cytotoxic therapy until there was no residual FDG activity within the nodal basin.  I do not favor this latter option given how poorly she tolerated her 6 cycle of chemotherapy.  After considering her options she is agreeable  to proceeding with surgical debulking.  Surgery has been tentatively scheduled for January 03, 2020.  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   The patient's history began in March, 2021 when she began experiencing flank and abdominal pains. These were evaluated at [facility] with a CT abd/pelvis which showed mixed solid and cystic lesions within the retroperitoneum centered predominantly in the aortocaval interval and tracking into the right iliac chain.  The largest solid component appeared to measure 5.9 x 4.3 x 6.4 cm.  To the left of the aorta was an additional lesion measuring approximately 4 x 3.6 x 4.6 cm.  The lesions partially encased to the abdominal aorta as well as the IMA origin, proximal right renal artery, and right common iliac.  There was compression of the IVC with loss of clearly discernible fat plane and a small segment concerning for possible intraluminal extension.  The uterus was surgically absent and there were no concerning adnexal masses seen.  No obvious distant metastatic disease was identified.  There was severe right hydroureter ureter nephrosis to the level of the mid ureter.  This was likely secondary to occlusion by the retroperitoneal node masses.  There was moderate left hydronephrosis possibly secondary to a distended urinary bladder versus compression from the left nodal mass.  She subsequently was seen and evaluated by oncologist, Dr Delton Coombes, who performed a PET scan on May 17, 2019.  This revealed the  previously demonstrated retroperitoneal masses which were hypermetabolic consistent with malignancy.  There is probable associated partial obstruction of the right mid ureter.  There was no hypermetabolic activity within the neck or chest.  There was no hypermetabolic areas within the pelvis.  An IR guided  lymph node biopsy was performed on Jun 03, 2019 and revealed adenocarcinoma with some antibodies.  The carcinoma had high-grade features with histology consistent with a possible serous carcinoma.  Immunostains were positive for cytokeratin 7, p53, PAX 8, and weak focal positivity for ER.  This immunostain profile was consistent with a gynecologic primary.  The patient was initially disinterested in chemotherapy and desired surgical resection, not understanding that both, and certainly chemotherapy, would be necessary for curative intent treatment.  The patient's medical history is remarkable for obesity with a BMI of 34 kg meters squared.  She has type 2 diabetes mellitus for which she is treated with oral medications.  She additionally has hypercholesterolemia and hypertension.  Her primary care physician is Dr. Cathlean Cower whom she sees 3-4 times per year.  Her surgical and gynecologic history is remarkable for a vaginal hysterectomy performed in her early 50s for benign uterine fibroids.  Her ovaries and fallopian tubes were not removed at that surgery.  She had a prior tubal ligation after pregnancies.  She had a laparoscopic cholecystectomy and vaginal bladder tacking with Dr. Gaynelle Arabian.  Her family history was remarkable only for daughter with a history of thyroid cancer.  The patient had seen Dr. Karsten Ro in the recent past for bladder discomfort symptoms for which she was treated medically.  She did not have stents placed.  Interval Hx:  She went on to receive 3 cycles of neoadjuvant carboplatin paclitaxel chemotherapy as she was deemed to not have surgically resectable disease at initial presentation due to the bulky and likely invasive nature of her retroperitoneal adenopathy.  She struggled somewhat with neoadjuvant chemotherapy requiring dose modifications.  After cycle 3 was administered she underwent a repeat CT scan of the abdomen and pelvis performed on October 04, 2019.  This revealed  that the retroperitoneal lymphadenopathy in the lower aortocaval space at the level of the bifurcations had decreased enhancement and a decrease in size measuring 5.2 x 2.9 cm (compared to 6 x 4.3 cm previously).  There was low-attenuation lymphadenopathy in the left parotid region with mild increase in size and index node measuring 5 x 4.4 cm which had slightly increased from 4.5 to 3.7 cm previously.  There were no new sites of lymphadenopathy identified.  Ca1 25 was drawn on October 06, 2019 which was day 1 of cycle 4.  This had normalized to 21.3 (prior to treatment in June 2021 this value was 346).  She continued chemotherapy receiving her 6 cycle on November 17, 2019.  The patient reported that the cycle was particularly difficult to tolerate.  She had substantial fatigue and weakness following the cycle of chemotherapy.  On November 17, 2019 which was day 1 of cycle 6 her Ca1 25 was normal at 15.2.  (It had normalized in September 2021).  PET restaging on December 06, 2019 showed improving periotic lymphadenopathy with the right para-aortic nodal conglomerate now measuring 2.4 x 4.8 cm in aggregate (it had previously been 2.6 x 5).  There was a very small region of FDG avidity within that area but mostly necrosis.  The area on the left parotic region was 4.8 x 4.2 cm and favored a benign retroperitoneal cyst versus lymphangioma as it  had been completely stable throughout therapy with no FDG uptake.  Current Meds:  Outpatient Encounter Medications as of 12/13/2019  Medication Sig  . ALPRAZolam (XANAX) 0.25 MG tablet Take 1 tablet (0.25 mg total) by mouth at bedtime as needed for anxiety.  . Blood Glucose Monitoring Suppl (ONE TOUCH ULTRA 2) w/Device KIT Use as directed  . cholecalciferol (VITAMIN D3) 25 MCG (1000 UNIT) tablet Take 2,000 Units by mouth daily.  Marland Kitchen docusate sodium (COLACE) 100 MG capsule   . eszopiclone (LUNESTA) 2 MG TABS tablet Take 1 tablet (2 mg total) by mouth at bedtime as  needed for sleep. Take immediately before bedtime  . glimepiride (AMARYL) 2 MG tablet TAKE ONE TABLET BY MOUTH DAILY  . Lancets (ONETOUCH ULTRASOFT) lancets USE TO CHECK BLOOD SUGAR TWICE DAILY  . loratadine (CLARITIN) 10 MG tablet Take 10 mg by mouth daily.  Marland Kitchen losartan-hydrochlorothiazide (HYZAAR) 100-12.5 MG tablet Take 1 tablet by mouth daily.  Glory Rosebush ULTRA test strip USE TO CHECK BLOOD SUGARS TWO TIMES DAILY  . OVER THE COUNTER MEDICATION Apply 1 application topically daily as needed (pain). Triderma otc pain cream   . solifenacin (VESICARE) 5 MG tablet Take 1 tablet (5 mg total) by mouth daily.  . vitamin B-12 (CYANOCOBALAMIN) 1000 MCG tablet Take 2,000 mcg by mouth daily.  Marland Kitchen CARBOPLATIN IV Inject into the vein every 21 ( twenty-one) days.  Marland Kitchen loperamide (IMODIUM) 2 MG capsule Take by mouth as needed for diarrhea or loose stools.  (Patient not taking: Reported on 12/12/2019)  . PACLITAXEL IV Inject 175 mg/m2 into the vein every 21 ( twenty-one) days.  . Pegfilgrastim (NEULASTA ONPRO [place]) Inject into the skin every 21 ( twenty-one) days.  . [DISCONTINUED] cetirizine (ZYRTEC) 10 MG tablet Take 10 mg by mouth daily.  . [DISCONTINUED] LORazepam (ATIVAN) 0.5 MG tablet    No facility-administered encounter medications on file as of 12/13/2019.    Allergy:  Allergies  Allergen Reactions  . Ciprofloxacin Swelling  . Hydrocodone-Homatropine Other (See Comments)    Vertigo *pt strongly prefers to never take*  . Sulfa Antibiotics Hives, Itching and Swelling    Tongue swells  . Augmentin [Amoxicillin-Pot Clavulanate]     Diarrhea; can take PCN/ Amoxicillin  . Codeine Itching  . Crestor [Rosuvastatin Calcium] Other (See Comments)    Did something to memory    . Doxycycline Other (See Comments)    Severe rectal Gas.  . Gabapentin     disoriented  . Keflex [Cephalexin] Diarrhea and Nausea And Vomiting  . Naproxen Other (See Comments)    Stomach cramps  . Statins     Muscle weakness   . Prednisone Anxiety    *pt strongly prefers to never be given prednisone*     Social Hx:   Social History   Socioeconomic History  . Marital status: Divorced    Spouse name: Not on file  . Number of children: 4  . Years of education: 5  . Highest education level: Not on file  Occupational History  . Occupation: retired Geographical information systems officer  Tobacco Use  . Smoking status: Never Smoker  . Smokeless tobacco: Never Used  Vaping Use  . Vaping Use: Never used  Substance and Sexual Activity  . Alcohol use: No    Alcohol/week: 0.0 standard drinks  . Drug use: No  . Sexual activity: Not Currently  Other Topics Concern  . Not on file  Social History Narrative  . Not on file   Social Determinants  of Health   Financial Resource Strain: Low Risk   . Difficulty of Paying Living Expenses: Not hard at all  Food Insecurity: No Food Insecurity  . Worried About Charity fundraiser in the Last Year: Never true  . Ran Out of Food in the Last Year: Never true  Transportation Needs: No Transportation Needs  . Lack of Transportation (Medical): No  . Lack of Transportation (Non-Medical): No  Physical Activity: Inactive  . Days of Exercise per Week: 0 days  . Minutes of Exercise per Session: 0 min  Stress: No Stress Concern Present  . Feeling of Stress : Not at all  Social Connections: Moderately Integrated  . Frequency of Communication with Friends and Family: More than three times a week  . Frequency of Social Gatherings with Friends and Family: More than three times a week  . Attends Religious Services: More than 4 times per year  . Active Member of Clubs or Organizations: Yes  . Attends Archivist Meetings: Never  . Marital Status: Divorced  Human resources officer Violence: Not At Risk  . Fear of Current or Ex-Partner: No  . Emotionally Abused: No  . Physically Abused: No  . Sexually Abused: No    Past Surgical Hx:  Past Surgical History:  Procedure Laterality Date   . ABDOMINAL HYSTERECTOMY     vaginal  . BLADDER SURGERY    . BREAST EXCISIONAL BIOPSY Bilateral   . BREAST SURGERY    . CHOLECYSTECTOMY    . fibroids removed     breast (both breasts)  . IR IMAGING GUIDED PORT INSERTION  07/26/2019   right  . MASS EXCISION Right 06/26/2016   Procedure: EXCISION MUCOID TUMOR RIGHT THUMB, IP RIGHT THUMB;  Surgeon: Daryll Brod, MD;  Location: [place];  Service: Orthopedics;  Laterality: Right;  . PARTIAL HYSTERECTOMY      Past Medical Hx:  Past Medical History:  Diagnosis Date  . Anxiety   . Arthritis    back of neck, bones spurs on neck  . Cataract   . Cervical disc disease   . Diabetes mellitus   . Family history of thyroid cancer   . Hyperlipidemia   . Hypertension   . Mucoid cyst of joint    right thumb  . Neuropathy   . Port-A-Cath in place 07/21/2019  . Reflux   . Sleep apnea    wears CPAP nightly  . Vertigo     Past Gynecological History:  See HPI No LMP recorded. Patient has had a hysterectomy.  Family Hx:  Family History  Problem Relation Age of Onset  . Hypertension Other   . Diabetes Father   . Hypertension Father   . Diabetes Sister   . Hypertension Mother   . Hypertension Sister   . Stroke Sister   . Diabetes Maternal Aunt   . Thyroid cancer Daughter 22  . Colon cancer Neg Hx   . Esophageal cancer Neg Hx   . Stomach cancer Neg Hx   . Rectal cancer Neg Hx   . Breast cancer Neg Hx   . Endometrial cancer Neg Hx   . Ovarian cancer Neg Hx     Review of Systems:  Constitutional  + fatigue and weakness ENT Normal appearing ears and nares bilaterally Skin/Breast  No rash, sores, jaundice, itching, dryness Cardiovascular  No chest pain, shortness of breath, or edema  Pulmonary  No cough or wheeze.  Gastro Intestinal  No nausea, vomitting, or diarrhoea. No  bright red blood per rectum, no abdominal pain, change in bowel movement, or constipation.  Genito Urinary  No frequency, urgency, dysuria,  denies hematuria Musculo Skeletal  No myalgia, arthralgia, joint swelling or pain  Neurologic  No weakness, numbness, change in gait,  Psychology  No depression, anxiety, insomnia.   Vitals:  Blood pressure (!) 156/60, pulse 96, temperature 98 F (36.7 C), temperature source Tympanic, resp. rate 18, weight 187 lb (84.8 kg), SpO2 100 %.  Physical Exam: WD in NAD Neck  Supple NROM, without any enlargements.  Lymph Node Survey No cervical supraclavicular or inguinal adenopathy Cardiovascular  Pulse normal rate, regularity and rhythm. S1 and S2 normal.  Lungs  Clear to auscultation bilateraly, without wheezes/crackles/rhonchi. Good air movement.  Skin  No rash/lesions/breakdown  Psychiatry  Alert and oriented to person, place, and time  Abdomen  Normoactive bowel sounds, abdomen soft, non-tender and obese without evidence of hernia.  Back No CVA tenderness Genito Urinary  Vulva/vagina: Normal external female genitalia.  No lesions. No discharge or bleeding.  Bladder/urethra:  No lesions or masses, well supported bladder  Vagina: smooth walled, no palpable masses in pelvis on bimanual exam. Surgically absent uterus. Rectal  deferred Extremities  No bilateral cyanosis, clubbing or edema.   Thereasa Solo, MD  12/13/2019, 12:38 PM

## 2019-12-13 NOTE — Patient Instructions (Signed)
Preparing for your Surgery  Plan for surgery on January 03, 2020 with Dr. Everitt Amber at [place] will be scheduled for a robotic assisted laparoscopic bilateral salpingo-oophorectomy, para-aortic lymphadenectomy, possible laparotomy.   You will need to give yourself once a day lovenox injections AFTER surgery for 2 weeks to prevent blood clots.  Pre-operative Testing -You will receive a phone call from presurgical testing at [facility] to arrange for a pre-operative appointment, lab appointment, and COVID test. The COVID test normally happens 3 days prior to the surgery and they ask that you self quarantine after the test up until surgery to decrease chance of exposure.  -Bring your insurance card, copy of an advanced directive if applicable, medication list  -At that visit, you will be asked to sign a consent for a possible blood transfusion in case a transfusion becomes necessary during surgery.  The need for a blood transfusion is rare but having consent is a necessary part of your care.     -You should not be taking blood thinners or aspirin at least ten days prior to surgery unless instructed by your surgeon.  -Do not take supplements such as fish oil (omega 3), red yeast rice, turmeric before your surgery.   Day Before Surgery at [place] will be asked to take in a light diet the day before surgery. You will be advised you can have clear liquids up until 3 hours before your surgery.    Eat a light diet the day before surgery.  Examples including soups, broths, toast, yogurt, mashed potatoes.  AVOID GAS PRODUCING FOODS. Things to avoid include carbonated beverages (fizzy beverages), raw fruits and raw vegetables, or beans.   If your bowels are filled with gas, your surgeon will have difficulty visualizing your pelvic organs which increases your surgical risks.  Your role in recovery Your role is to become active as soon as directed by your doctor, while  still giving yourself time to heal.  Rest when you feel tired. You will be asked to do the following in order to speed your recovery:  - Cough and breathe deeply. This helps to clear and expand your lungs and can prevent pneumonia after surgery.  - [place]. Do mild physical activity. Walking or moving your legs help your circulation and body functions return to normal. Do not try to get up or walk alone the first time after surgery.   -If you develop swelling on one leg or the other, pain in the back of your leg, redness/warmth in one of your legs, please call the office or go to the Emergency Room to have a doppler to rule out a blood clot. For shortness of breath, chest pain-seek care in the Emergency Room as soon as possible. - Actively manage your pain. Managing your pain lets you move in comfort. We will ask you to rate your pain on a scale of zero to 10. It is your responsibility to tell your doctor or nurse where and how much you hurt so your pain can be treated.  Special Considerations -If you are diabetic, you may be placed on insulin after surgery to have closer control over your blood sugars to promote healing and recovery.  This does not mean that you will be discharged on insulin.  If applicable, your oral antidiabetics will be resumed when you are tolerating a solid diet.  -Your final pathology results from surgery should be available around one week after  surgery and the results will be relayed to you when available.  -Dr. Lahoma Crocker is the surgeon that assists your GYN Oncologist with surgery.  If you end up staying the night, the next day after your surgery you will either see Dr. Denman George, Dr. Berline Lopes, or Dr. Lahoma Crocker.  -FMLA forms can be faxed to [phone] and please allow 5-7 business days for completion.  Pain Management After Surgery -You have been prescribed your pain medication and bowel regimen medications before surgery so that you can  have these available when you are discharged from the hospital. The pain medication is for use ONLY AFTER surgery and a new prescription will not be given.   -Make sure that you have Tylenol and Ibuprofen at home to use on a regular basis after surgery for pain control. We recommend alternating the medications every hour to six hours since they work differently and are processed in the body differently for pain relief.  -Review the attached handout on narcotic use and their risks and side effects.   Bowel Regimen -You have been prescribed Sennakot-S to take nightly to prevent constipation especially if you are taking the narcotic pain medication intermittently.  It is important to prevent constipation and drink adequate amounts of liquids. You can stop taking this medication when you are not taking pain medication and you are back on your normal bowel routine.  Risks of Surgery Risks of surgery are low but include bleeding, infection, damage to surrounding structures, re-operation, blood clots, and very rarely death.   Blood Transfusion Information (For the consent to be signed before surgery)  We will be checking your blood type before surgery so in case of emergencies, we will know what type of blood you would need.                                            WHAT IS A BLOOD TRANSFUSION?  A transfusion is the replacement of blood or some of its parts. Blood is made up of multiple cells which provide different functions.  Red blood cells carry oxygen and are used for blood loss replacement.  White blood cells fight against infection.  Platelets control bleeding.  Plasma helps clot blood.  Other blood products are available for specialized needs, such as hemophilia or other clotting disorders. BEFORE THE TRANSFUSION  Who gives blood for transfusions?   You may be able to donate blood to be used at a later date on yourself (autologous donation).  Relatives can be asked to donate  blood. This is generally not any safer than if you have received blood from a stranger. The same precautions are taken to ensure safety when a relative's blood is donated.  Healthy volunteers who are fully evaluated to make sure their blood is safe. This is blood bank blood. Transfusion therapy is the safest it has ever been in the practice of medicine. Before blood is taken from a donor, a complete history is taken to make sure that person has no history of diseases nor engages in risky social behavior (examples are intravenous drug use or sexual activity with multiple partners). The donor's travel history is screened to minimize risk of transmitting infections, such as malaria. The donated blood is tested for signs of infectious diseases, such as HIV and hepatitis. The blood is then tested to be sure it is compatible with you in  order to minimize the chance of a transfusion reaction. If you or a relative donates blood, this is often done in anticipation of surgery and is not appropriate for emergency situations. It takes many days to process the donated blood. RISKS AND COMPLICATIONS Although transfusion therapy is very safe and saves many lives, the main dangers of transfusion include:   Getting an infectious disease.  Developing a transfusion reaction. This is an allergic reaction to something in the blood you were given. Every precaution is taken to prevent this. The decision to have a blood transfusion has been considered carefully by your caregiver before blood is given. Blood is not given unless the benefits outweigh the risks.  AFTER SURGERY INSTRUCTIONS  Return to work: 4 weeks if applicable  Activity: 1. Be up and out of the bed during the day.  Take a nap if needed.  You may walk up steps but be careful and use the hand rail.  Stair climbing will tire you more than you think, you may need to stop part way and rest.   2. No lifting or straining for 6 weeks over 10 pounds. No pushing,  pulling, straining for 6 weeks.  3. No driving for 1 week(s).  Do not drive if you are taking narcotic pain medicine and make sure that your reaction time has returned.   4. You can shower as soon as the next day after surgery. Shower daily.  Use your regular soap and water (not directly on the incision) and pat your incision(s) dry afterwards; don't rub.  No tub baths or submerging your body in water until cleared by your surgeon. If you have the soap that was given to you by pre-surgical testing that was used before surgery, you do not need to use it afterwards because this can irritate your incisions.   5. No sexual activity and nothing in the vagina for 4 weeks.  6. You may experience a small amount of clear drainage from your incisions, which is normal.  If the drainage persists, increases, or changes color please call the office.  7. Do not use creams, lotions, or ointments such as neosporin on your incisions after surgery until advised by your surgeon because they can cause removal of the dermabond glue on your incisions.    8. Take Tylenol or ibuprofen first for pain and only use narcotic pain medication for severe pain not relieved by the Tylenol or Ibuprofen.  Monitor your Tylenol intake to a max of 4,000 mg in a 24 hour period. You can alternate these medications after surgery.  Diet: 1. Low sodium Heart Healthy Diet is recommended but you are cleared to resume your normal (before surgery) diet after your procedure.  2. It is safe to use a laxative, such as Miralax or Colace, if you have difficulty moving your bowels. You have been prescribed Sennakot at bedtime every evening to keep bowel movements regular and to prevent constipation.    Wound Care: 1. Keep clean and dry.  Shower daily.  Reasons to call the Doctor:  Fever - Oral temperature greater than 100.4 degrees Fahrenheit  Foul-smelling vaginal discharge  Difficulty urinating  Nausea and vomiting  Increased pain at  the site of the incision that is unrelieved with pain medicine.  Difficulty breathing with or without chest pain  New calf pain especially if only on one side  Sudden, continuing increased vaginal bleeding with or without clots.   Contacts: For questions or concerns you should contact:  Dr.  Everitt Amber at [phone]  Joylene John, NP at [phone]  After Hours: call [phone] and have the GYN Oncologist paged/contacted (after 5 pm or on the weekends)  Enoxaparin injection What is this medicine? ENOXAPARIN (ee nox a PA rin) is used after knee, hip, or abdominal surgeries to prevent blood clotting. It is also used to treat existing blood clots in the lungs or in the veins. This medicine may be used for other purposes; ask your health care provider or pharmacist if you have questions. COMMON BRAND NAME(S): Lovenox What should I tell my health care provider before I take this medicine? They need to know if you have any of these conditions:  bleeding disorders, hemorrhage, or hemophilia  infection of the heart or heart valves  kidney or liver disease  previous stroke  prosthetic heart valve  recent surgery or delivery of a baby  ulcer in the stomach or intestine, diverticulitis, or other bowel disease  an unusual or allergic reaction to enoxaparin, heparin, pork or pork products, other medicines, foods, dyes, or preservatives  pregnant or trying to get pregnant  breast-feeding How should I use this medicine? This medicine is for injection under the skin. It is usually given by a health-care professional. You or a family member may be trained on how to give the injections. If you are to give yourself injections, make sure you understand how to use the syringe, measure the dose if necessary, and give the injection. To avoid bruising, do not rub the site where this medicine has been injected. Do not take your medicine more often than directed. Do not stop taking except on  the advice of your doctor or health care professional. Make sure you receive a puncture-resistant container to dispose of the needles and syringes once you have finished with them. Do not reuse these items. Return the container to your doctor or health care professional for proper disposal. Talk to your pediatrician regarding the use of this medicine in children. Special care may be needed. Overdosage: If you think you have taken too much of this medicine contact a poison control center or emergency room at once. NOTE: This medicine is only for you. Do not share this medicine with others. What if I miss a dose? If you miss a dose, take it as soon as you can. If it is almost time for your next dose, take only that dose. Do not take double or extra doses. What may interact with this medicine?  aspirin and aspirin-like medicines  certain medicines that treat or prevent blood clots  dipyridamole  NSAIDs, medicines for pain and inflammation, like ibuprofen or naproxen This list may not describe all possible interactions. Give your health care provider a list of all the medicines, herbs, non-prescription drugs, or dietary supplements you use. Also tell them if you smoke, drink alcohol, or use illegal drugs. Some items may interact with your medicine. What should I watch for while using this medicine? Visit your healthcare professional for regular checks on your progress. You may need blood work done while you are taking this medicine. Your condition will be monitored carefully while you are receiving this medicine. It is important not to miss any appointments. If you are going to need surgery or other procedure, tell your healthcare professional that you are using this medicine. Using this medicine for a long time may weaken your bones and increase the risk of bone fractures. Avoid sports and activities that might cause injury while you are using this  medicine. Severe falls or injuries can cause  unseen bleeding. Be careful when using sharp tools or knives. Consider using an Copy. Take special care brushing or flossing your teeth. Report any injuries, bruising, or red spots on the skin to your healthcare professional. Wear a medical ID bracelet or chain. Carry a card that describes your disease and details of your medicine and dosage times. What side effects may I notice from receiving this medicine? Side effects that you should report to your doctor or health care professional as soon as possible:  allergic reactions like skin rash, itching or hives, swelling of the face, lips, or tongue  bone pain  signs and symptoms of bleeding such as bloody or black, tarry stools; red or dark-brown urine; spitting up blood or brown material that looks like coffee grounds; red spots on the skin; unusual bruising or bleeding from the eye, gums, or nose  signs and symptoms of a blood clot such as chest pain; shortness of breath; pain, swelling, or warmth in the leg  signs and symptoms of a stroke such as changes in vision; confusion; trouble speaking or understanding; severe headaches; sudden numbness or weakness of the face, arm or leg; trouble walking; dizziness; loss of coordination Side effects that usually do not require medical attention (report to your doctor or health care professional if they continue or are bothersome):  hair loss  pain, redness, or irritation at site where injected This list may not describe all possible side effects. Call your doctor for medical advice about side effects. You may report side effects to FDA at 1-800-FDA-1088. Where should I keep my medicine? Keep out of the reach of children. Store at room temperature between 15 and 30 degrees C (59 and 86 degrees F). Do not freeze. If your injections have been specially prepared, you may need to store them in the refrigerator. Ask your pharmacist. Throw away any unused medicine after the expiration date. NOTE:  This sheet is a summary. It may not cover all possible information. If you have questions about this medicine, talk to your doctor, pharmacist, or health care provider.  2020 Elsevier/Gold Standard (2016-12-25 11:25:34)

## 2019-12-13 NOTE — Progress Notes (Signed)
Subjective:    CC: R medial ankle pain  I, Molly [name], LAT, ATC, am serving as scribe for Dr. Lynne Leader.  HPI: Pt is a 74 y/o female presenting w/ R medial ankle pain since Aug w/ known MOI, but pt suspects the injury is related to an antibiotic she took in August 2021 for UTI and attributes pain to side effect of "torn tendon".  Pt locates pain to around medial malleolus and up into medial/distal lower leg.  Radiating pain: no R ankle swelling: slight along medial side Aggravating factors: INV and EV Treatments tried: pain cream, tylenol, meloxicam  Diagnostic imaging: R ankle XR- 12/07/19  Pertinent review of Systems: No fevers or chills  Relevant historical information: Primary ovarian adenocarcinoma.  Had neoadjuvant chemotherapy and is proceeding to surgical excision later this month December 21.   Objective:    Vitals:   12/14/19 1237  BP: 130/82  Pulse: 97  SpO2: 100%   General: Well Developed, well nourished, and in no acute distress.   MSK: Right ankle swollen at medial aspect.  Tender palpation and just posterior to medial malleolus and at tarsal tunnel.  Normal ankle motion pain with inversion.  Pain with resisted eversion.  Strength testing intact.  Stable ligamentous exam.  Lab and Radiology Results EXAM: RIGHT ANKLE - COMPLETE 3+ VIEW  COMPARISON:  None.  FINDINGS: There is no evidence of fracture, dislocation, or joint effusion. There is no evidence of arthropathy or other focal bone abnormality. Soft tissues are unremarkable.  IMPRESSION: Negative.   Electronically Signed   By: Constance Holster M.D.   On: 12/07/2019 23:09  I, Lynne Leader, personally (independently) visualized and performed the interpretation of the images attached in this note.  Diagnostic Limited MSK Ultrasound of: Right medial ankle History tibialis visualized.  Hypoechoic change within tendon sheath at just posterior to medial malleolus across tarsal  tunnel.  Small hypoechoic change within mid substance tendon tarsal tunnel indicates probable split tear.  No full-thickness retracted tear visible. Impression: Posterior tibialis tendinitis and probable linear split tear posterior tibialis tendon.    Impression and Recommendations:    Assessment and Plan: 74 y.o. female with posterior tibialis tendinitis and probable linear split tear.  Occurring several months ago after antibiotic exposure.  Very likely was fluoroquinolone.  Regardless at this point will treat conservatively with home exercise program, Voltaren gel, and consider cam walker boot if needed.  If not improved could proceed to steroid injection.  Would like to avoid steroid injection as this may further weaken the tendon.  Additionally she has an upcoming large abdominal surgery scheduled for about 3 weeks from now.  Steroids could theoretically impact wound healing would like to avoid steroid if possible.  Check back with me in 6 weeks.  Return sooner if needed.Marland Kitchen  PDMP not reviewed this encounter. Orders Placed This Encounter  Procedures  . [place] LIMITED JOINT SPACE STRUCTURES LOW RIGHT(NO LINKED CHARGES)    Standing Status:   Future    Number of Occurrences:   1    Standing Expiration Date:   06/13/2020    Order Specific Question:   Reason for Exam (SYMPTOM  OR DIAGNOSIS REQUIRED)    Answer:   chronic right ankle pain    Order Specific Question:   Preferred imaging location?    Answer:   [place]   No orders of the defined types were placed in this encounter.   Discussed warning signs or symptoms. Please see  discharge instructions. Patient expresses understanding.   The above documentation has been reviewed and is accurate and complete Lynne Leader, M.D.

## 2019-12-14 ENCOUNTER — Encounter: Payer: Self-pay | Admitting: Family Medicine

## 2019-12-14 ENCOUNTER — Other Ambulatory Visit: Payer: Self-pay | Admitting: Gynecologic Oncology

## 2019-12-14 ENCOUNTER — Ambulatory Visit (INDEPENDENT_AMBULATORY_CARE_PROVIDER_SITE_OTHER): Payer: Medicare Other | Admitting: Family Medicine

## 2019-12-14 ENCOUNTER — Other Ambulatory Visit: Payer: Self-pay

## 2019-12-14 ENCOUNTER — Ambulatory Visit: Payer: Self-pay

## 2019-12-14 VITALS — BP 130/82 | HR 97 | Ht 64.0 in | Wt 186.8 lb

## 2019-12-14 DIAGNOSIS — G8929 Other chronic pain: Secondary | ICD-10-CM

## 2019-12-14 DIAGNOSIS — M66871 Spontaneous rupture of other tendons, right ankle and foot: Secondary | ICD-10-CM | POA: Diagnosis not present

## 2019-12-14 DIAGNOSIS — C569 Malignant neoplasm of unspecified ovary: Secondary | ICD-10-CM

## 2019-12-14 DIAGNOSIS — M25571 Pain in right ankle and joints of right foot: Secondary | ICD-10-CM | POA: Diagnosis not present

## 2019-12-14 MED ORDER — HYDROMORPHONE HCL 2 MG PO TABS: 1.0000 mg | ORAL_TABLET | ORAL | 0 refills | Status: DC | PRN

## 2019-12-14 MED ORDER — ENOXAPARIN SODIUM 40 MG/0.4ML ~~LOC~~ SOLN: 40.0000 mg | SUBCUTANEOUS | 0 refills | Status: DC

## 2019-12-14 NOTE — Patient Instructions (Addendum)
Thank you for coming in today. You have tendonitis of the posterior Tibial Tendon.  Do the home exericses.  View at my-exercise-code.com using code: 4QBUD7T  Please use voltaren gel up to 4x daily for pain as needed.   If needed ok to use a Banker. We can help get that set up.   Recheck in about 6 weeks.

## 2019-12-14 NOTE — Progress Notes (Signed)
Post-op meds prescribed pre-op so patient can have them available after surgery.

## 2019-12-15 ENCOUNTER — Inpatient Hospital Stay (HOSPITAL_BASED_OUTPATIENT_CLINIC_OR_DEPARTMENT_OTHER): Payer: Medicare Other | Admitting: Hematology

## 2019-12-15 ENCOUNTER — Inpatient Hospital Stay (HOSPITAL_COMMUNITY): Payer: Medicare Other | Attending: Hematology

## 2019-12-15 ENCOUNTER — Ambulatory Visit (HOSPITAL_COMMUNITY): Payer: Medicare Other

## 2019-12-15 ENCOUNTER — Telehealth: Payer: Self-pay

## 2019-12-15 VITALS — BP 198/79 | HR 96 | Temp 97.2°F | Resp 20 | Wt 186.2 lb

## 2019-12-15 DIAGNOSIS — E785 Hyperlipidemia, unspecified: Secondary | ICD-10-CM | POA: Insufficient documentation

## 2019-12-15 DIAGNOSIS — E119 Type 2 diabetes mellitus without complications: Secondary | ICD-10-CM | POA: Diagnosis not present

## 2019-12-15 DIAGNOSIS — C569 Malignant neoplasm of unspecified ovary: Secondary | ICD-10-CM

## 2019-12-15 DIAGNOSIS — R5383 Other fatigue: Secondary | ICD-10-CM | POA: Diagnosis not present

## 2019-12-15 DIAGNOSIS — Z9221 Personal history of antineoplastic chemotherapy: Secondary | ICD-10-CM | POA: Insufficient documentation

## 2019-12-15 DIAGNOSIS — N3289 Other specified disorders of bladder: Secondary | ICD-10-CM | POA: Insufficient documentation

## 2019-12-15 DIAGNOSIS — Z452 Encounter for adjustment and management of vascular access device: Secondary | ICD-10-CM | POA: Insufficient documentation

## 2019-12-15 DIAGNOSIS — R630 Anorexia: Secondary | ICD-10-CM | POA: Diagnosis not present

## 2019-12-15 DIAGNOSIS — M545 Low back pain, unspecified: Secondary | ICD-10-CM | POA: Insufficient documentation

## 2019-12-15 DIAGNOSIS — M2550 Pain in unspecified joint: Secondary | ICD-10-CM | POA: Insufficient documentation

## 2019-12-15 DIAGNOSIS — I10 Essential (primary) hypertension: Secondary | ICD-10-CM | POA: Diagnosis not present

## 2019-12-15 DIAGNOSIS — Z95828 Presence of other vascular implants and grafts: Secondary | ICD-10-CM

## 2019-12-15 DIAGNOSIS — R600 Localized edema: Secondary | ICD-10-CM | POA: Diagnosis not present

## 2019-12-15 DIAGNOSIS — Z79899 Other long term (current) drug therapy: Secondary | ICD-10-CM | POA: Insufficient documentation

## 2019-12-15 LAB — CBC WITH DIFFERENTIAL/PLATELET
Abs Immature Granulocytes: 0.03 10*3/uL (ref 0.00–0.07)
Basophils Absolute: 0 10*3/uL (ref 0.0–0.1)
Basophils Relative: 1 %
Eosinophils Absolute: 0.1 10*3/uL (ref 0.0–0.5)
Eosinophils Relative: 2 %
HCT: 30.7 % — ABNORMAL LOW (ref 36.0–46.0)
Hemoglobin: 10.3 g/dL — ABNORMAL LOW (ref 12.0–15.0)
Immature Granulocytes: 1 %
Lymphocytes Relative: 33 %
Lymphs Abs: 1.5 10*3/uL (ref 0.7–4.0)
MCH: 32.8 pg (ref 26.0–34.0)
MCHC: 33.6 g/dL (ref 30.0–36.0)
MCV: 97.8 fL (ref 80.0–100.0)
Monocytes Absolute: 0.4 10*3/uL (ref 0.1–1.0)
Monocytes Relative: 8 %
Neutro Abs: 2.5 10*3/uL (ref 1.7–7.7)
Neutrophils Relative %: 55 %
Platelets: 122 10*3/uL — ABNORMAL LOW (ref 150–400)
RBC: 3.14 MIL/uL — ABNORMAL LOW (ref 3.87–5.11)
RDW: 14.4 % (ref 11.5–15.5)
WBC: 4.4 10*3/uL (ref 4.0–10.5)
nRBC: 0 % (ref 0.0–0.2)

## 2019-12-15 LAB — COMPREHENSIVE METABOLIC PANEL
ALT: 14 U/L (ref 0–44)
AST: 18 U/L (ref 15–41)
Albumin: 4.4 g/dL (ref 3.5–5.0)
Alkaline Phosphatase: 52 U/L (ref 38–126)
Anion gap: 9 (ref 5–15)
BUN: 23 mg/dL (ref 8–23)
CO2: 23 mmol/L (ref 22–32)
Calcium: 9.8 mg/dL (ref 8.9–10.3)
Chloride: 103 mmol/L (ref 98–111)
Creatinine, Ser: 0.96 mg/dL (ref 0.44–1.00)
GFR, Estimated: >60 mL/min (ref >60)
Glucose, Bld: 200 mg/dL — ABNORMAL HIGH (ref 70–99)
Potassium: 3.7 mmol/L (ref 3.5–5.1)
Sodium: 135 mmol/L (ref 135–145)
Total Bilirubin: 0.5 mg/dL (ref 0.3–1.2)
Total Protein: 7.4 g/dL (ref 6.5–8.1)

## 2019-12-15 MED ORDER — HEPARIN SOD (PORK) LOCK FLUSH 100 UNIT/ML IV SOLN
500.0000 [IU] | Freq: Once | INTRAVENOUS | Status: AC
  Administered 2019-12-15: 500 [IU] via INTRAVENOUS

## 2019-12-15 MED ORDER — SODIUM CHLORIDE 0.9% FLUSH
20.0000 mL | INTRAVENOUS | Status: DC | PRN
  Administered 2019-12-15: 20 mL via INTRAVENOUS

## 2019-12-15 NOTE — Patient Instructions (Addendum)
[place] at [facility] Discharge Instructions  You were seen today by Dr. Delton Coombes. He went over your recent results. You may proceed with your surgery on 12/21 with Dr. Denman George. You may take a tramadol for your ankle pain as needed. Dr. Delton Coombes will see you back in 10 weeks for follow up.   Thank you for choosing [place] at [facility] to provide your oncology and hematology care.  To afford each patient quality time with our provider, please arrive at least 15 minutes before your scheduled appointment time.   If you have a lab appointment with the [facility] please come in thru the Main Entrance and check in at the main information desk  You need to re-schedule your appointment should you arrive 10 or more minutes late.  We strive to give you quality time with our providers, and arriving late affects you and other patients whose appointments are after yours.  Also, if you no show three or more times for appointments you may be dismissed from the clinic at the providers discretion.     Again, thank you for choosing [facility].  Our hope is that these requests will decrease the amount of time that you wait before being seen by our physicians.       _____________________________________________________________  Should you have questions after your visit to [facility], please contact our office at [phone] between the hours of 8:00 a.m. and 4:30 p.m.  Voicemails left after 4:00 p.m. will not be returned until the following business day.  For prescription refill requests, have your pharmacy contact our office and allow 72 hours.    Cancer Center Support Programs:   > Cancer Support Group  2nd Tuesday of the month 1pm-2pm, Journey Room

## 2019-12-15 NOTE — Patient Instructions (Signed)
[facility] at [facility] Discharge Instructions  Labs drawn from portacath today   Thank you for choosing  Cancer Center at [facility] to provide your oncology and hematology care.  To afford each patient quality time with our provider, please arrive at least 15 minutes before your scheduled appointment time.   If you have a lab appointment with the Cancer Center please come in thru the Main Entrance and check in at the main information desk.  You need to re-schedule your appointment should you arrive 10 or more minutes late.  We strive to give you quality time with our providers, and arriving late affects you and other patients whose appointments are after yours.  Also, if you no show three or more times for appointments you may be dismissed from the clinic at the providers discretion.     Again, thank you for choosing [facility].  Our hope is that these requests will decrease the amount of time that you wait before being seen by our physicians.       _____________________________________________________________  Should you have questions after your visit to [facility], please contact our office at [phone] and follow the prompts.  Our office hours are 8:00 a.m. and 4:30 p.m. Monday - Friday.  Please note that voicemails left after 4:00 p.m. may not be returned until the following business day.  We are closed weekends and major holidays.  You do have access to a nurse 24-7, just call the main number to the clinic [phone] and do not press any options, hold on the line and a nurse will answer the phone.    For prescription refill requests, have your pharmacy contact our office and allow 72 hours.    Due to Covid, you will need to wear a mask upon entering the hospital. If you do not have a mask, a mask will be given to you at the Main Entrance upon arrival. For doctor visits, patients may have 1 support person age 18  or older with them. For treatment visits, patients can not have anyone with them due to social distancing guidelines and our immunocompromised population.     

## 2019-12-15 NOTE — Progress Notes (Signed)
Laura [name], Laura [name]   CLINIC:  Medical Oncology/Hematology  PCP:  Laura Borg, MD [address] / [place] [address] [phone]   REASON FOR VISIT:  Follow-up for ovarian cancer  PRIOR THERAPY: Carboplatin, paclitaxel and Aloxi x 6 cycles from 07/28/2019 to 11/17/2019  NGS Results: Foundation 1 MS--stable, TMB 0 Muts/Mb  CURRENT THERAPY: Observation  BRIEF ONCOLOGIC HISTORY:  Oncology History  Primary ovarian adenocarcinoma, unspecified laterality ([place])  06/20/2019 Initial Diagnosis   Primary ovarian adenocarcinoma, unspecified laterality ([place])   07/01/2019 Genetic Testing   Foundation One     07/11/2019 Genetic Testing   Negative genetic testing:  No pathogenic variants detected on the [facility] Multi-Cancer Panel. The report date is 07/11/2019.  The Multi-Cancer Panel offered by [facility] includes sequencing and/or deletion duplication testing of the following 85 genes: AIP, ALK, APC, ATM, AXIN2,BAP1,  BARD1, BLM, BMPR1A, BRCA1, BRCA2, BRIP1, CASR, CDC73, CDH1, CDK4, CDKN1B, CDKN1C, CDKN2A (p14ARF), CDKN2A (p16INK4a), CEBPA, CHEK2, CTNNA1, DICER1, DIS3L2, EGFR (c.2369C>T, p.Thr790Met variant only), EPCAM (Deletion/duplication testing only), FH, FLCN, GATA2, GPC3, GREM1 (Promoter region deletion/duplication testing only), HOXB13 (c.251G>A, p.Gly84Glu), HRAS, KIT, MAX, MEN1, MET, MITF (c.952G>A, p.Glu318Lys variant only), MLH1, MSH2, MSH3, MSH6, MUTYH, NBN, NF1, NF2, NTHL1, PALB2, PDGFRA, PHOX2B, PMS2, POLD1, POLE, POT1, PRKAR1A, PTCH1, PTEN, RAD50, RAD51C, RAD51D, RB1, RECQL4, RET, RNF43, RUNX1, SDHAF2, SDHA (sequence changes only), SDHB, SDHC, SDHD, SMAD4, SMARCA4, SMARCB1, SMARCE1, STK11, SUFU, TERC, TERT, TMEM127, TP53, TSC1, TSC2, VHL, WRN and WT1.   07/28/2019 -  Chemotherapy   The patient had palonosetron (ALOXI) injection 0.25 mg, 0.25 mg, Intravenous,  Once, 6 of 7 cycles Administration: 0.25 mg (07/28/2019), 0.25 mg  (08/18/2019), 0.25 mg (09/15/2019), 0.25 mg (10/06/2019), 0.25 mg (10/27/2019), 0.25 mg (11/17/2019) pegfilgrastim (NEULASTA ONPRO KIT) injection 6 mg, 6 mg, Subcutaneous, Once, 6 of 7 cycles Administration: 6 mg (07/28/2019), 6 mg (08/18/2019), 6 mg (09/15/2019), 6 mg (10/06/2019), 6 mg (10/27/2019), 6 mg (11/17/2019) CARBOplatin (PARAPLATIN) 470 mg in sodium chloride 0.9 % 250 mL chemo infusion, 470 mg (100 % of original dose 470 mg), Intravenous,  Once, 6 of 7 cycles Dose modification:   (original dose 470 mg, Cycle 1, Reason: Provider Judgment),   (original dose 568.2 mg, Cycle 2),   (original dose 460.5 mg, Cycle 3), 460 mg (original dose 460 mg, Cycle 4, Reason: Provider Judgment),   (original dose 473.5 mg, Cycle 5, Reason: Provider Judgment), 460 mg (original dose 460 mg, Cycle 6) Administration: 470 mg (07/28/2019), 570 mg (08/18/2019), 460 mg (09/15/2019), 460 mg (10/06/2019), 460 mg (10/27/2019), 460 mg (11/17/2019) fosaprepitant (EMEND) 150 mg in sodium chloride 0.9 % 145 mL IVPB, 150 mg, Intravenous,  Once, 6 of 7 cycles Administration: 150 mg (07/28/2019), 150 mg (08/18/2019), 150 mg (09/15/2019), 150 mg (10/06/2019), 150 mg (10/27/2019), 150 mg (11/17/2019) PACLitaxel (TAXOL) 282 mg in sodium chloride 0.9 % 250 mL chemo infusion (> 12m/m2), 140 mg/m2 = 282 mg (80 % of original dose 175 mg/m2), Intravenous,  Once, 6 of 7 cycles Dose modification: 140 mg/m2 (80 % of original dose 175 mg/m2, Cycle 1, Reason: Provider Judgment), 150 mg/m2 (original dose 175 mg/m2, Cycle 2, Reason: Provider Judgment), 140 mg/m2 (original dose 175 mg/m2, Cycle 3, Reason: Provider Judgment), 140 mg/m2 (original dose 175 mg/m2, Cycle 4, Reason: Other (see comments), Comment: fatigue) Administration: 282 mg (07/28/2019), 300 mg (08/18/2019), 282 mg (09/15/2019), 282 mg (10/06/2019), 282 mg (10/27/2019), 282 mg (11/17/2019)  for chemotherapy treatment.  CANCER STAGING: Cancer Staging No matching staging information was found for the  patient.  INTERVAL HISTORY:  [name], a 74 y.o. female, returns for routine follow-up for her ovarian cancer. [name] was last seen on 11/17/2019.  Today she reports feeling okay. She reports feeling restless, unable to sleep, and having decreased appetite due to abnormal taste after her last treatment, but all 3 resolved eventually. She denies having N/V/D or numbness or tingling. She reports having a torn tendon on the inside of her right ankle diagnosed by Dr. Georgina [name]. She will have surgery on 12/21 with Laura [name] for a robotic-assisted bilateral salpingo-oopherectomy. She admits that she drank a Boost this AM.   REVIEW OF SYSTEMS:  Review of Systems  Constitutional: Positive for appetite change (50%) and fatigue (50% d/t decreased taste).  Gastrointestinal: Negative for diarrhea, nausea and vomiting.  Musculoskeletal: Positive for arthralgias (5/10 R ankle d/t torn tendon).  Neurological: Negative for numbness.  Psychiatric/Behavioral: Positive for sleep disturbance (restless and sleep disturbance after last Tx).  All other systems reviewed and are negative.   PAST MEDICAL/SURGICAL HISTORY:  Past Medical History:  Diagnosis Date  . Anxiety   . Arthritis    back of neck, bones spurs on neck  . Cataract   . Cervical disc disease   . Diabetes mellitus   . Family history of thyroid cancer   . Hyperlipidemia   . Hypertension   . Mucoid cyst of joint    right thumb  . Neuropathy   . Port-A-Cath in place 07/21/2019  . Reflux   . Sleep apnea    wears CPAP nightly  . Vertigo    Past Surgical History:  Procedure Laterality Date  . ABDOMINAL HYSTERECTOMY     vaginal  . BLADDER SURGERY    . BREAST EXCISIONAL BIOPSY Bilateral   . BREAST SURGERY    . CHOLECYSTECTOMY    . fibroids removed     breast (both breasts)  . IR IMAGING GUIDED PORT INSERTION  07/26/2019   right  . MASS EXCISION Right 06/26/2016   Procedure: EXCISION MUCOID TUMOR RIGHT THUMB, IP RIGHT THUMB;  Surgeon:  Laura Brod, MD;  Location: [facility];  Service: Orthopedics;  Laterality: Right;  . PARTIAL HYSTERECTOMY      SOCIAL HISTORY:  Social History   Socioeconomic History  . Marital status: Divorced    Spouse name: Not on file  . Number of children: 4  . Years of education: 64  . Highest education level: Not on file  Occupational History  . Occupation: retired Geographical information systems officer  Tobacco Use  . Smoking status: Never Smoker  . Smokeless tobacco: Never Used  Vaping Use  . Vaping Use: Never used  Substance and Sexual Activity  . Alcohol use: No    Alcohol/week: 0.0 standard drinks  . Drug use: No  . Sexual activity: Not Currently  Other Topics Concern  . Not on file  Social History Narrative  . Not on file   Social Determinants of Health   Financial Resource Strain: Low Risk   . Difficulty of Paying Living Expenses: Not hard at all  Food Insecurity: No Food Insecurity  . Worried About Charity fundraiser in the Last Year: Never true  . Ran Out of Food in the Last Year: Never true  Transportation Needs: No Transportation Needs  . Lack of Transportation (Medical): No  . Lack of Transportation (Non-Medical): No  Physical Activity: Inactive  . Days of Exercise  per Week: 0 days  . Minutes of Exercise per Session: 0 min  Stress: No Stress Concern Present  . Feeling of Stress : Not at all  Social Connections: Moderately Integrated  . Frequency of Communication with Friends and Family: More than three times a week  . Frequency of Social Gatherings with Friends and Family: More than three times a week  . Attends Religious Services: More than 4 times per year  . Active Member of Clubs or Organizations: Yes  . Attends Archivist Meetings: Never  . Marital Status: Divorced  Human resources officer Violence: Not At Risk  . Fear of Current or Ex-Partner: No  . Emotionally Abused: No  . Physically Abused: No  . Sexually Abused: No    FAMILY HISTORY:    Family History  Problem Relation Age of Onset  . Hypertension Other   . Diabetes Father   . Hypertension Father   . Diabetes Sister   . Hypertension Mother   . Hypertension Sister   . Stroke Sister   . Diabetes Maternal Aunt   . Thyroid cancer Daughter 63  . Colon cancer Neg Hx   . Esophageal cancer Neg Hx   . Stomach cancer Neg Hx   . Rectal cancer Neg Hx   . Breast cancer Neg Hx   . Endometrial cancer Neg Hx   . Ovarian cancer Neg Hx     CURRENT MEDICATIONS:  Current Outpatient Medications  Medication Sig Dispense Refill  . Blood Glucose Monitoring Suppl (ONE TOUCH ULTRA 2) w/Device KIT Use as directed 1 each 0  . CARBOPLATIN IV Inject into the vein every 21 ( twenty-one) days.    . cholecalciferol (VITAMIN D3) 25 MCG (1000 UNIT) tablet Take 2,000 Units by mouth daily.    Marland Kitchen docusate sodium (COLACE) 100 MG capsule     . [START ON 01/04/2020] enoxaparin (LOVENOX) 40 MG/0.4ML injection Inject 0.4 mLs (40 mg total) into the skin daily for 14 days. For AFTER surgery only 5.6 mL 0  . eszopiclone (LUNESTA) 2 MG TABS tablet Take 1 tablet (2 mg total) by mouth at bedtime as needed for sleep. Take immediately before bedtime 90 tablet 1  . glimepiride (AMARYL) 2 MG tablet TAKE ONE TABLET BY MOUTH DAILY 90 tablet 3  . Lancets (ONETOUCH ULTRASOFT) lancets USE TO CHECK BLOOD SUGAR TWICE DAILY 100 each 3  . loratadine (CLARITIN) 10 MG tablet Take 10 mg by mouth daily.    Marland Kitchen losartan-hydrochlorothiazide (HYZAAR) 100-12.5 MG tablet Take 1 tablet by mouth daily. 90 tablet 3  . ONETOUCH ULTRA test strip USE TO CHECK BLOOD SUGARS TWO TIMES DAILY 100 strip 3  . OVER THE COUNTER MEDICATION Apply 1 application topically daily as needed (pain). Triderma otc pain cream     . PACLITAXEL IV Inject 175 mg/m2 into the vein every 21 ( twenty-one) days.    . Pegfilgrastim (NEULASTA ONPRO [place]) Inject into the skin every 21 ( twenty-one) days.    Marland Kitchen solifenacin (VESICARE) 5 MG tablet Take 1 tablet (5 mg total)  by mouth daily. 90 tablet 3  . vitamin B-12 (CYANOCOBALAMIN) 1000 MCG tablet Take 2,000 mcg by mouth daily.    Marland Kitchen ALPRAZolam (XANAX) 0.25 MG tablet Take 1 tablet (0.25 mg total) by mouth at bedtime as needed for anxiety. (Patient not taking: Reported on 12/15/2019) 30 tablet 5  . HYDROmorphone (DILAUDID) 2 MG tablet Take 0.5-1 tablets (1-2 mg total) by mouth every 4 (four) hours as needed for severe pain. (Patient  not taking: Reported on 12/15/2019) 10 tablet 0  . loperamide (IMODIUM) 2 MG capsule Take by mouth as needed for diarrhea or loose stools.  (Patient not taking: Reported on 12/15/2019)     No current facility-administered medications for this visit.   Facility-Administered Medications Ordered in Other Visits  Medication Dose Route Frequency Provider Last Rate Last Admin  . heparin lock flush 100 unit/mL  500 Units Intravenous Once Derek Jack, MD      . sodium chloride flush (NS) 0.9 % injection 20 mL  20 mL Intravenous PRN Derek Jack, MD        ALLERGIES:  Allergies  Allergen Reactions  . Ciprofloxacin Swelling  . Hydrocodone-Homatropine Other (See Comments)    Vertigo *pt strongly prefers to never take*  . Sulfa Antibiotics Hives, Itching and Swelling    Tongue swells  . Augmentin [Amoxicillin-Pot Clavulanate]     Diarrhea; can take PCN/ Amoxicillin  . Codeine Itching  . Crestor [Rosuvastatin Calcium] Other (See Comments)    Did something to memory    . Doxycycline Other (See Comments)    Severe rectal Gas.  . Gabapentin     disoriented  . Keflex [Cephalexin] Diarrhea and Nausea And Vomiting  . Naproxen Other (See Comments)    Stomach cramps  . Statins     Muscle weakness  . Prednisone Anxiety    *pt strongly prefers to never be given prednisone*     PHYSICAL EXAM:  Performance status (ECOG): 1 - Symptomatic but completely ambulatory  Vitals:   12/15/19 0917  BP: (!) 198/79  Pulse: 96  Resp: 20  Temp: (!) 97.2 F (36.2 C)  SpO2: 99%    Wt Readings from Last 3 Encounters:  12/15/19 186 lb 3.2 oz (84.5 kg)  12/14/19 186 lb 12.8 oz (84.7 kg)  12/13/19 187 lb (84.8 kg)   Physical Exam Vitals reviewed.  Constitutional:      Appearance: Normal appearance. She is obese.  Cardiovascular:     Rate and Rhythm: Normal rate and regular rhythm.     Pulses: Normal pulses.     Heart sounds: Normal heart sounds.  Pulmonary:     Effort: Pulmonary effort is normal.     Breath sounds: Normal breath sounds.  Musculoskeletal:     Right lower leg: No edema.     Left lower leg: No edema.  Neurological:     General: No focal deficit present.     Mental Status: She is alert and oriented to person, place, and time.  Psychiatric:        Mood and Affect: Mood normal.        Behavior: Behavior normal.     LABORATORY DATA:  I have reviewed the labs as listed.  CBC Latest Ref Rng & Units 12/15/2019 11/17/2019 10/27/2019  WBC 4.0 - 10.5 K/uL 4.4 6.5 5.7  Hemoglobin 12.0 - 15.0 g/dL 10.3(L) 10.2(L) 10.5(L)  Hematocrit 36 - 46 % 30.7(L) 30.6(L) 30.6(L)  Platelets 150 - 400 K/uL 122(L) 180 152   CMP Latest Ref Rng & Units 12/15/2019 11/17/2019 10/27/2019  Glucose 70 - 99 mg/dL 200(H) 166(H) 138(H)  BUN 8 - 23 mg/dL _0 Creatinine 0.44 - 1.00 mg/dL 0.96 0.96 0.87  Sodium 135 - 145 mmol/L 135 136 135  Potassium 3.5 - 5.1 mmol/L 3.7 3.7 3.6  Chloride 98 - 111 mmol/L 103 105 103  CO2 22 - 32 mmol/L _1 Calcium 8.9 - 10.3 mg/dL 9.8 9.5 9.8  Total Protein 6.5 - 8.1 g/dL 7.4 7.3 7.1  Total Bilirubin 0.3 - 1.2 mg/dL 0.5 0.3 0.7  Alkaline Phos 38 - 126 U/L 52 69 64  AST 15 - 41 U/L 18 17 14(L)  ALT 0 - 44 U/L _0 DIAGNOSTIC IMAGING:  I have independently reviewed the scans and discussed with the patient. DG Ankle Complete Right  Result Date: 12/07/2019 CLINICAL DATA:  Medial ankle pain. EXAM: RIGHT ANKLE - COMPLETE 3+ VIEW COMPARISON:  None. FINDINGS: There is no evidence of fracture, dislocation, or joint  effusion. There is no evidence of arthropathy or other focal bone abnormality. Soft tissues are unremarkable. IMPRESSION: Negative. Electronically Signed   By: Constance Holster M.D.   On: 12/07/2019 23:09   NM PET Image Restag (PS) Skull Base To Thigh  Result Date: 12/06/2019 CLINICAL DATA:  Subsequent treatment strategy for ovarian cancer. EXAM: NUCLEAR MEDICINE PET SKULL BASE TO THIGH TECHNIQUE: 14.7 mCi F-18 FDG was injected intravenously. Full-ring PET imaging was performed from the skull base to thigh after the radiotracer. CT data was obtained and used for attenuation correction and anatomic localization. Fasting blood glucose: 108 mg/dl COMPARISON:  CT abdomen/pelvis dated 10/04/2019. PET-CT dated 05/17/2019. FINDINGS: Mediastinal blood pool activity: SUV max 2.7 Liver activity: SUV max NA NECK: No hypermetabolic cervical lymphadenopathy. Incidental CT findings: none CHEST: No hypermetabolic thoracic lymphadenopathy. No suspicious pulmonary nodules. Right chest port terminates cavoatrial junction. Incidental CT findings: Mild atherosclerotic calcifications of the aortic arch. Three vessel coronary atherosclerosis. ABDOMEN/PELVIS: 4.8 x 4.2 cm fluid density lesion in the left para-aortic region (series 3/image 170) is non FDG avid and favors a benign retroperitoneal cyst versus lymphangioma. Right/anterior para-aortic mixed cystic/solid lesions measuring 2.4 x 4.8 cm in aggregate, max SUV 2.5, recently 2.6 x 5.0 cm on CT. This previously measured 5.7 x 6.1 cm on PET with max SUV 19.5, indicating significant improvement, now with underlying necrosis. No abnormal hypermetabolism in the liver, spleen, pancreas, or adrenal glands. Incidental CT findings: Status post cholecystectomy. Atherosclerotic calcifications the abdominal aorta and branch vessels. Status post hysterectomy. Low-lying bladder/cystocele at rest. SKELETON: No focal hypermetabolic activity to suggest skeletal metastasis. Incidental CT  findings: Mild degenerative changes of the visualized thoracolumbar spine. IMPRESSION: Improving para-aortic lymphadenopathy with necrosis and mild residual hypermetabolism, as above. Stable fluid density lesion in the left para-aortic region, favoring a benign retroperitoneal cyst versus lymphangioma. No evidence of metastatic disease in the neck/chest. Electronically Signed   By: Julian Hy M.D.   On: 12/06/2019 11:50   MM DIGITAL SCREENING BILATERAL  Result Date: 11/29/2019 CLINICAL DATA:  Screening. EXAM: DIGITAL SCREENING BILATERAL MAMMOGRAM WITH CAD COMPARISON:  Previous exam(s). ACR Breast Density Category c: The breast tissue is heterogeneously dense, which may obscure small masses. FINDINGS: There are no findings suspicious for malignancy. Images were processed with CAD. IMPRESSION: No mammographic evidence of malignancy. A result letter of this screening mammogram will be mailed directly to the patient. RECOMMENDATION: Screening mammogram in one year. (Code:[id number]-B-01Y) BI-RADS CATEGORY  1: Negative. Electronically Signed   By: Everlean Alstrom M.D.   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 Mass measures 6 x 5.8 cm and is partially calcified. Separate solid component superior to this in the left periaortic region measures 3.5 cm, SUV 14.3. Cystic component medial to the lower pole of the left  kidney is without hypermetabolic activity, possibly a lymphocele. -CT-guided biopsy of the right retroperitoneal lymph node consistent with adenocarcinoma with psammoma bodies. Morphology and immunotherapy consistent with ovarian serous carcinoma/primary peritoneal carcinoma. -Germline mutation testing was negative. -Foundation 1 testing shows MS-stable. Loss of heterozygosity was less than 16%. -She met with Dr. Denman [name] who  recommended neoadjuvant chemotherapywith 3-6 cycles of carboplatin and paclitaxel with interim staging after 3 cycles. If there is substantial response unless encasement of the aorta and vena cava, consideration for debulking procedure can be possible at that time. -CA-125 346 on 06/23/2019. -Cycle 1 of carboplatin and paclitaxel on 07/28/2019. -CTAP on 9/21/2021after 3 cycles of chemotherapy showed retroperitoneal adenopathy at the bifurcation measuring 5.2 x 2.9 cm, left para-aortic lymphadenopathy measuring 5 x 4.4 cm, both of them decreased in size when compared to most recent PET scan. CT scan report says that one of the lesion has gotten bigger but this was compared to CT scan from 04/02/2019. -PET scan on December 05, 2019 shows 4.8 x 4.2 cm fluid filled density in the left para-aortic region, non-FDG avid favoring benign retroperitoneal cyst versus lymphangioma.  Right/anterior para-aortic mixed cystic/solid lesions measuring 2.4 x 4.8 cm, SUV 2.5, previous SUV 19.5.    PLAN:  1. Ovarian serous carcinoma: -She is recovering well from last cycle of chemotherapy.  She did have some tiredness with chemo even after a dose reduction. -We reviewed PET scan from December 05, 2019 which showed improving para-aortic lymphadenopathy mild residual hypermetabolism.  Stable fluid density in the left para-aortic region. -Her tumor marker also normalized.  LFTs are normal. -She had extensive discussion with Dr. Denman [name] and is being planned for debulking surgery and bilateral nephrectomy on January 03, 2020. -We will see her back in 10 weeks for follow-up.  2. Low back pain: -She is not requiring pain medications as the pain improved after treatment.  3. Constipation: -Continue Senokot 1 tablet daily.  4. Hypertension: -Continue Norvasc and lisinopril/HCTZ.  5. Leg swelling: -Continue Lasix as needed.  6. Difficulty sleeping: -Continue lorazepam as needed.  7. Bladder  spasms: -Continue Vesicare as needed.   Orders placed this encounter:  No orders of the defined types were placed in this encounter.    Derek Jack, MD [place] [phone]   I, Milinda Antis, am acting as a scribe for Dr. Sanda Linger.  I, Derek Jack MD, have reviewed the above documentation for accuracy and completeness, and I agree with the above.

## 2019-12-15 NOTE — Telephone Encounter (Signed)
Told Ms Darr that Ruffin Pyo, NP said that we wll try dilaudid for pain management postoperatively.  If it nees to to changed to something different, that can be done. Pt verbalized understanding.

## 2019-12-16 ENCOUNTER — Telehealth: Payer: Self-pay | Admitting: Internal Medicine

## 2019-12-16 NOTE — Telephone Encounter (Signed)
Patient said that she recently stopped her chemo treatments and she was wondering if she could go back on amLODipine (NORVASC) 10 MG tablet She said that her BP has been high. She can be reached at [phone]

## 2019-12-19 NOTE — Telephone Encounter (Signed)
Needs OV.  

## 2019-12-19 NOTE — Telephone Encounter (Signed)
Sent to Dr. John. 

## 2019-12-22 ENCOUNTER — Ambulatory Visit: Payer: Medicare Other | Admitting: Internal Medicine

## 2019-12-26 NOTE — Patient Instructions (Addendum)
DUE TO COVID-19 ONLY ONE VISITOR IS ALLOWED TO COME WITH YOU AND STAY IN THE WAITING ROOM ONLY DURING PRE OP AND PROCEDURE DAY OF SURGERY. THE 1 VISITOR  MAY VISIT WITH YOU AFTER SURGERY IN YOUR PRIVATE ROOM DURING VISITING HOURS ONLY!  YOU NEED TO HAVE A COVID 19 TEST ON: 12/30/19 @ 1:00 PM , THIS TEST MUST BE DONE BEFORE SURGERY,  COVID TESTING SITE [place] [id number], IT IS ON THE RIGHT GOING OUT [address] APPROXIMATELY  2 MINUTES PAST [employer] ON THE RIGHT. ONCE YOUR COVID TEST IS COMPLETED,  PLEASE BEGIN THE QUARANTINE INSTRUCTIONS AS OUTLINED IN YOUR HANDOUT.                Laura [name]   Your procedure is scheduled on:01/03/20    Report to [facility] Main  Entrance   Report to admitting at: 8:00 AM     Call this number if you have problems the morning of surgery [phone]    Remember:  Eat a light diet the day before surgery.  Examples including soups, broths, toast, yogurt, mashed potatoes.  Things to avoid include carbonated beverages (fizzy beverages), raw fruits and raw vegetables, or beans.   If your bowels are filled with gas, your surgeon will have difficulty visualizing your pelvic organs which increases your surgical risks. NO SOLID FOOD AFTER MIDNIGHT THE NIGHT PRIOR TO SURGERY. NOTHING BY MOUTH EXCEPT CLEAR LIQUIDS UNTIL: 7:00 AM .   CLEAR LIQUID DIET   Foods Allowed                                                                     Foods Excluded  Coffee and tea, regular and decaf                             liquids that you cannot  Plain Jell-O any favor except red or purple                                           see through such as: Fruit ices (not with fruit pulp)                                     milk, soups, orange juice  Iced Popsicles                                    All solid food Carbonated beverages, regular and diet                                    Cranberry, grape and apple juices Sports  drinks like Gatorade Lightly seasoned clear broth or consume(fat free) Sugar, honey syrup  Sample Menu Breakfast  Lunch                                     Supper Cranberry juice                    Beef broth                            Chicken broth Jell-O                                     Grape juice                           Apple juice Coffee or tea                        Jell-O                                      Popsicle                                                Coffee or tea                        Coffee or tea  _____________________________________________________________________  BRUSH YOUR TEETH MORNING OF SURGERY AND RINSE YOUR MOUTH OUT, NO CHEWING GUM CANDY OR MINTS.   Take these medicines the morning of surgery with A SIP OF WATER: amlodipine,loratadine,famotidine as needed.                            How to Manage Your Diabetes Before and After Surgery  Why is it important to control my blood sugar before and after surgery?  Improving blood sugar levels before and after surgery helps healing and can limit problems.  A way of improving blood sugar control is eating a healthy diet by: o  Eating less sugar and carbohydrates o  Increasing activity/exercise o  Talking with your doctor about reaching your blood sugar goals  High blood sugars (greater than 180 mg/dL) can raise your risk of infections and slow your recovery, so you will need to focus on controlling your diabetes during the weeks before surgery.  Make sure that the doctor who takes care of your diabetes knows about your planned surgery including the date and location.  How do I manage my blood sugar before surgery?  Check your blood sugar at least 4 times a day, starting 2 days before surgery, to make sure that the level is not too high or low. o Check your blood sugar the morning of your surgery when you wake up and every 2 hours until you get to the Short Stay  unit.  If your blood sugar is less than 70 mg/dL, you will need to treat for low blood sugar: o Do not take insulin. o Treat a low blood sugar (less than 70 mg/dL) with  cup of clear juice (cranberry or apple),  4 glucose tablets, OR glucose gel. o Recheck blood sugar in 15 minutes after treatment (to make sure it is greater than 70 mg/dL). If your blood sugar is not greater than 70 mg/dL on recheck, call [phone] for further instructions.  Report your blood sugar to the short stay nurse when you get to Short Stay.   If you are admitted to the hospital after surgery: o Your blood sugar will be checked by the staff and you will probably be given insulin after surgery (instead of oral diabetes medicines) to make sure you have good blood sugar levels. o The goal for blood sugar control after surgery is 80-180 mg/dL.   WHAT DO I DO ABOUT MY DIABETES MEDICATION?   Do not take oral diabetes medicines (pills) the morning of surgery.   THE DAY BEFORE SURGERY, take Glimepiride as usual.      THE MORNING OF SURGERY, Do not take oral diabetes medicines (pills) the morning of surgery.              You may not have any metal on your body including hair pins and              piercings  Do not wear jewelry, make-up, lotions, powders or perfumes, deodorant             Do not wear nail polish on your fingernails.  Do not shave  48 hours prior to surgery.              Do not bring valuables to the hospital. [place].  Contacts, dentures or bridgework may not be worn into surgery.  Leave suitcase in the car. After surgery it may be brought to your room.     Patients discharged the day of surgery will not be allowed to drive home. IF YOU ARE HAVING SURGERY AND GOING HOME THE SAME DAY, YOU MUST HAVE AN ADULT TO DRIVE YOU HOME AND BE WITH YOU FOR 24 HOURS. YOU MAY GO HOME BY TAXI OR UBER OR ORTHERWISE, BUT AN ADULT MUST ACCOMPANY YOU HOME AND STAY  WITH YOU FOR 24 HOURS.  Name and phone number of your driver:  Special Instructions: N/A              Please read over the following fact sheets you were given: _____________________________________________________________________         [facility] - Preparing for Surgery Before surgery, you can play an important role.  Because skin is not sterile, your skin needs to be as free of germs as possible.  You can reduce the number of germs on your skin by washing with CHG (chlorahexidine gluconate) soap before surgery.  CHG is an antiseptic cleaner which kills germs and bonds with the skin to continue killing germs even after washing. Please DO NOT use if you have an allergy to CHG or antibacterial soaps.  If your skin becomes reddened/irritated stop using the CHG and inform your nurse when you arrive at Short Stay. Do not shave (including legs and underarms) for at least 48 hours prior to the first CHG shower.  You may shave your face/neck. Please follow these instructions carefully:  1.  Shower with CHG Soap the night before surgery and the  morning of Surgery.  2.  If you choose to wash your hair, wash your hair first as usual with your  normal  shampoo.  3.  After you shampoo, rinse your hair and body thoroughly to remove the  shampoo.                           4.  Use CHG as you would any other liquid soap.  You can apply chg directly  to the skin and wash                       Gently with a scrungie or clean washcloth.  5.  Apply the CHG Soap to your body ONLY FROM THE NECK DOWN.   Do not use on face/ open                           Wound or open sores. Avoid contact with eyes, ears mouth and genitals (private parts).                       Wash face,  Genitals (private parts) with your normal soap.             6.  Wash thoroughly, paying special attention to the area where your surgery  will be performed.  7.  Thoroughly rinse your body with warm water from the neck down.  8.  DO NOT  shower/wash with your normal soap after using and rinsing off  the CHG Soap.                9.  Pat yourself dry with a clean towel.            10.  Wear clean pajamas.            11.  Place clean sheets on your bed the night of your first shower and do not  sleep with pets. Day of Surgery : Do not apply any lotions/deodorants the morning of surgery.  Please wear clean clothes to the hospital/surgery center.  FAILURE TO FOLLOW THESE INSTRUCTIONS MAY RESULT IN THE CANCELLATION OF YOUR SURGERY PATIENT SIGNATURE_________________________________  NURSE SIGNATURE__________________________________  ________________________________________________________________________

## 2019-12-27 ENCOUNTER — Encounter (HOSPITAL_COMMUNITY): Payer: Self-pay

## 2019-12-27 ENCOUNTER — Other Ambulatory Visit: Payer: Self-pay

## 2019-12-27 ENCOUNTER — Ambulatory Visit: Payer: Medicare Other | Admitting: Internal Medicine

## 2019-12-27 ENCOUNTER — Encounter (HOSPITAL_COMMUNITY)
Admission: RE | Admit: 2019-12-27 | Discharge: 2019-12-27 | Disposition: A | Discharge status: Home / Self Care | Payer: Medicare Other | Source: Ambulatory Visit | Attending: Gynecologic Oncology | Admitting: Gynecologic Oncology

## 2019-12-27 DIAGNOSIS — Z01812 Encounter for preprocedural laboratory examination: Secondary | ICD-10-CM | POA: Insufficient documentation

## 2019-12-27 HISTORY — DX: Cardiac murmur, unspecified: R01.1

## 2019-12-27 HISTORY — DX: Malignant (primary) neoplasm, unspecified: C80.1

## 2019-12-27 HISTORY — DX: Anemia, unspecified: D64.9

## 2019-12-27 LAB — CBC
HCT: 33.3 % — ABNORMAL LOW (ref 36.0–46.0)
Hemoglobin: 11 g/dL — ABNORMAL LOW (ref 12.0–15.0)
MCH: 32.1 pg (ref 26.0–34.0)
MCHC: 33 g/dL (ref 30.0–36.0)
MCV: 97.1 fL (ref 80.0–100.0)
Platelets: 131 10*3/uL — ABNORMAL LOW (ref 150–400)
RBC: 3.43 MIL/uL — ABNORMAL LOW (ref 3.87–5.11)
RDW: 13.3 % (ref 11.5–15.5)
WBC: 5.1 10*3/uL (ref 4.0–10.5)
nRBC: 0 % (ref 0.0–0.2)

## 2019-12-27 LAB — BASIC METABOLIC PANEL
Anion gap: 7 (ref 5–15)
BUN: 19 mg/dL (ref 8–23)
CO2: 25 mmol/L (ref 22–32)
Calcium: 10 mg/dL (ref 8.9–10.3)
Chloride: 106 mmol/L (ref 98–111)
Creatinine, Ser: 0.97 mg/dL (ref 0.44–1.00)
GFR, Estimated: >60 mL/min (ref >60)
Glucose, Bld: 167 mg/dL — ABNORMAL HIGH (ref 70–99)
Potassium: 4.1 mmol/L (ref 3.5–5.1)
Sodium: 138 mmol/L (ref 135–145)

## 2019-12-27 LAB — URINALYSIS, ROUTINE W REFLEX MICROSCOPIC
Bilirubin Urine: NEGATIVE
Glucose, UA: NEGATIVE mg/dL
Hgb urine dipstick: NEGATIVE
Ketones, ur: NEGATIVE mg/dL
Leukocytes,Ua: NEGATIVE
Nitrite: NEGATIVE
Protein, ur: NEGATIVE mg/dL
Specific Gravity, Urine: 1.013 (ref 1.005–1.030)
pH: 5 (ref 5.0–8.0)

## 2019-12-27 LAB — GLUCOSE, CAPILLARY: Glucose-Capillary: 151 mg/dL — ABNORMAL HIGH (ref 70–99)

## 2019-12-27 NOTE — Progress Notes (Addendum)
COVID Vaccine Completed: NO Date COVID Vaccine completed: COVID vaccine manufacturer: [employer]   PCP - Dr. Cathlean Cower Cardiologist - NO  Chest x-ray - 09/19/19 EKG - 09/21/19 Stress Test -  ECHO - 2016 Cardiac Cath -  Pacemaker/ICD device last checked: A1-C: 6.5: 11/11/19 Sleep Study - Yes CPAP - NO  Fasting Blood Sugar -  Checks Blood Sugar _____ times a day  Blood Thinner Instructions: Aspirin Instructions: Last Dose:  Anesthesia review: Hx: DIA,HTN,OSA(No CPAP).  Patient denies shortness of breath, fever, cough and chest pain at PAT appointment   Patient verbalized understanding of instructions that were given to them at the PAT appointment. Patient was also instructed that they will need to review over the PAT instructions again at home before surgery.

## 2019-12-30 ENCOUNTER — Other Ambulatory Visit (HOSPITAL_COMMUNITY)
Admission: RE | Admit: 2019-12-30 | Discharge: 2019-12-30 | Disposition: A | Discharge status: Home / Self Care | Payer: Medicare Other | Source: Ambulatory Visit | Attending: Gynecologic Oncology | Admitting: Gynecologic Oncology

## 2019-12-30 DIAGNOSIS — Z01812 Encounter for preprocedural laboratory examination: Secondary | ICD-10-CM | POA: Diagnosis not present

## 2019-12-30 DIAGNOSIS — Z20822 Contact with and (suspected) exposure to covid-19: Secondary | ICD-10-CM | POA: Insufficient documentation

## 2019-12-30 LAB — SARS CORONAVIRUS 2 (TAT 6-24 HRS): SARS Coronavirus 2: NEGATIVE

## 2020-01-02 ENCOUNTER — Encounter (HOSPITAL_COMMUNITY): Payer: Self-pay

## 2020-01-02 ENCOUNTER — Telehealth: Payer: Self-pay

## 2020-01-02 NOTE — Telephone Encounter (Signed)
Laura [name] stated that she understands her written pre-op instructions. No questions noted at this time.

## 2020-01-03 ENCOUNTER — Inpatient Hospital Stay (HOSPITAL_COMMUNITY): Payer: Medicare Other | Admitting: Physician Assistant

## 2020-01-03 ENCOUNTER — Inpatient Hospital Stay (HOSPITAL_COMMUNITY)
Admission: RE | Admit: 2020-01-03 | Discharge: 2020-01-04 | DRG: 737 | Disposition: A | Discharge status: Home / Self Care | Payer: Medicare Other | Attending: Gynecologic Oncology | Admitting: Gynecologic Oncology

## 2020-01-03 ENCOUNTER — Encounter (HOSPITAL_COMMUNITY)
Admission: RE | Disposition: A | Discharge status: Home / Self Care | Payer: Self-pay | Source: Home / Self Care | Attending: Gynecologic Oncology

## 2020-01-03 ENCOUNTER — Encounter (HOSPITAL_COMMUNITY): Payer: Self-pay | Admitting: Gynecologic Oncology

## 2020-01-03 ENCOUNTER — Other Ambulatory Visit: Payer: Self-pay

## 2020-01-03 ENCOUNTER — Inpatient Hospital Stay (HOSPITAL_COMMUNITY): Payer: Medicare Other | Admitting: Anesthesiology

## 2020-01-03 DIAGNOSIS — Z6834 Body mass index (BMI) 34.0-34.9, adult: Secondary | ICD-10-CM

## 2020-01-03 DIAGNOSIS — Z885 Allergy status to narcotic agent status: Secondary | ICD-10-CM

## 2020-01-03 DIAGNOSIS — Z9221 Personal history of antineoplastic chemotherapy: Secondary | ICD-10-CM | POA: Diagnosis not present

## 2020-01-03 DIAGNOSIS — Z888 Allergy status to other drugs, medicaments and biological substances status: Secondary | ICD-10-CM | POA: Diagnosis not present

## 2020-01-03 DIAGNOSIS — E119 Type 2 diabetes mellitus without complications: Secondary | ICD-10-CM | POA: Diagnosis not present

## 2020-01-03 DIAGNOSIS — I898 Other specified noninfective disorders of lymphatic vessels and lymph nodes: Secondary | ICD-10-CM | POA: Diagnosis not present

## 2020-01-03 DIAGNOSIS — G629 Polyneuropathy, unspecified: Secondary | ICD-10-CM | POA: Diagnosis not present

## 2020-01-03 DIAGNOSIS — C772 Secondary and unspecified malignant neoplasm of intra-abdominal lymph nodes: Secondary | ICD-10-CM | POA: Diagnosis not present

## 2020-01-03 DIAGNOSIS — Z808 Family history of malignant neoplasm of other organs or systems: Secondary | ICD-10-CM | POA: Diagnosis not present

## 2020-01-03 DIAGNOSIS — G4730 Sleep apnea, unspecified: Secondary | ICD-10-CM | POA: Diagnosis present

## 2020-01-03 DIAGNOSIS — C786 Secondary malignant neoplasm of retroperitoneum and peritoneum: Secondary | ICD-10-CM | POA: Diagnosis not present

## 2020-01-03 DIAGNOSIS — Z881 Allergy status to other antibiotic agents status: Secondary | ICD-10-CM

## 2020-01-03 DIAGNOSIS — G4733 Obstructive sleep apnea (adult) (pediatric): Secondary | ICD-10-CM | POA: Diagnosis not present

## 2020-01-03 DIAGNOSIS — K219 Gastro-esophageal reflux disease without esophagitis: Secondary | ICD-10-CM | POA: Diagnosis present

## 2020-01-03 DIAGNOSIS — M5090 Cervical disc disorder, unspecified, unspecified cervical region: Secondary | ICD-10-CM | POA: Diagnosis present

## 2020-01-03 DIAGNOSIS — Z823 Family history of stroke: Secondary | ICD-10-CM | POA: Diagnosis not present

## 2020-01-03 DIAGNOSIS — Z882 Allergy status to sulfonamides status: Secondary | ICD-10-CM

## 2020-01-03 DIAGNOSIS — E559 Vitamin D deficiency, unspecified: Secondary | ICD-10-CM | POA: Diagnosis not present

## 2020-01-03 DIAGNOSIS — Z833 Family history of diabetes mellitus: Secondary | ICD-10-CM | POA: Diagnosis not present

## 2020-01-03 DIAGNOSIS — E785 Hyperlipidemia, unspecified: Secondary | ICD-10-CM | POA: Diagnosis present

## 2020-01-03 DIAGNOSIS — I889 Nonspecific lymphadenitis, unspecified: Secondary | ICD-10-CM | POA: Diagnosis not present

## 2020-01-03 DIAGNOSIS — I10 Essential (primary) hypertension: Secondary | ICD-10-CM | POA: Diagnosis not present

## 2020-01-03 DIAGNOSIS — E669 Obesity, unspecified: Secondary | ICD-10-CM | POA: Diagnosis not present

## 2020-01-03 DIAGNOSIS — C569 Malignant neoplasm of unspecified ovary: Secondary | ICD-10-CM | POA: Diagnosis present

## 2020-01-03 DIAGNOSIS — Z8249 Family history of ischemic heart disease and other diseases of the circulatory system: Secondary | ICD-10-CM

## 2020-01-03 DIAGNOSIS — C561 Malignant neoplasm of right ovary: Principal | ICD-10-CM | POA: Diagnosis present

## 2020-01-03 HISTORY — PX: ROBOTIC PELVIC AND PARA-AORTIC LYMPH NODE DISSECTION: SHX6210

## 2020-01-03 HISTORY — PX: ROBOTIC ASSISTED BILATERAL SALPINGO OOPHERECTOMY: SHX6078

## 2020-01-03 LAB — TYPE AND SCREEN
ABO/RH(D): A POS
Antibody Screen: NEGATIVE

## 2020-01-03 LAB — GLUCOSE, CAPILLARY
Glucose-Capillary: 135 mg/dL — ABNORMAL HIGH (ref 70–99)
Glucose-Capillary: 196 mg/dL — ABNORMAL HIGH (ref 70–99)
Glucose-Capillary: 240 mg/dL — ABNORMAL HIGH (ref 70–99)

## 2020-01-03 LAB — ABO/RH: ABO/RH(D): A POS

## 2020-01-03 SURGERY — SALPINGO-OOPHORECTOMY, BILATERAL, ROBOT-ASSISTED
Anesthesia: General | Site: Abdomen

## 2020-01-03 MED ORDER — SUGAMMADEX SODIUM 200 MG/2ML IV SOLN
INTRAVENOUS | Status: DC | PRN
  Administered 2020-01-03: 170 mg via INTRAVENOUS

## 2020-01-03 MED ORDER — ACETAMINOPHEN 325 MG PO TABS: 325.0000 mg | ORAL_TABLET | ORAL | Status: DC | PRN

## 2020-01-03 MED ORDER — LACTATED RINGERS IV SOLN: INTRAVENOUS | Status: DC

## 2020-01-03 MED ORDER — ROCURONIUM BROMIDE 10 MG/ML (PF) SYRINGE
PREFILLED_SYRINGE | INTRAVENOUS | Status: AC
  Filled 2020-01-03: qty 10

## 2020-01-03 MED ORDER — OXYCODONE HCL 5 MG PO TABS: 5.0000 mg | ORAL_TABLET | Freq: Once | ORAL | Status: DC | PRN

## 2020-01-03 MED ORDER — KETAMINE HCL 10 MG/ML IJ SOLN
INTRAMUSCULAR | Status: AC
  Filled 2020-01-03: qty 1

## 2020-01-03 MED ORDER — LABETALOL HCL 5 MG/ML IV SOLN
5.0000 mg | Freq: Once | INTRAVENOUS | Status: AC
  Administered 2020-01-03: 5 mg via INTRAVENOUS

## 2020-01-03 MED ORDER — FENTANYL CITRATE (PF) 250 MCG/5ML IJ SOLN
INTRAMUSCULAR | Status: AC
  Filled 2020-01-03: qty 5

## 2020-01-03 MED ORDER — ZOLPIDEM TARTRATE 5 MG PO TABS
5.0000 mg | ORAL_TABLET | Freq: Every day | ORAL | Status: DC
  Filled 2020-01-03: qty 1

## 2020-01-03 MED ORDER — AMLODIPINE BESYLATE 5 MG PO TABS
5.0000 mg | ORAL_TABLET | Freq: Every day | ORAL | Status: DC
  Administered 2020-01-04: 11:00:00 5 mg via ORAL
  Filled 2020-01-03: qty 1

## 2020-01-03 MED ORDER — DEXAMETHASONE SODIUM PHOSPHATE 10 MG/ML IJ SOLN
INTRAMUSCULAR | Status: AC
  Filled 2020-01-03: qty 1

## 2020-01-03 MED ORDER — LIDOCAINE HCL 2 % IJ SOLN
INTRAMUSCULAR | Status: AC
  Filled 2020-01-03: qty 20

## 2020-01-03 MED ORDER — OXYCODONE HCL 5 MG/5ML PO SOLN: 5.0000 mg | Freq: Once | ORAL | Status: DC | PRN

## 2020-01-03 MED ORDER — AMISULPRIDE (ANTIEMETIC) 5 MG/2ML IV SOLN: 10.0000 mg | Freq: Once | INTRAVENOUS | Status: AC

## 2020-01-03 MED ORDER — FENTANYL CITRATE (PF) 100 MCG/2ML IJ SOLN
INTRAMUSCULAR | Status: AC
  Filled 2020-01-03: qty 2

## 2020-01-03 MED ORDER — ALPRAZOLAM 0.25 MG PO TABS
0.2500 mg | ORAL_TABLET | Freq: Every evening | ORAL | Status: DC | PRN
  Filled 2020-01-03: qty 1

## 2020-01-03 MED ORDER — CEFAZOLIN SODIUM-DEXTROSE 2-4 GM/100ML-% IV SOLN
2.0000 g | INTRAVENOUS | Status: AC
  Administered 2020-01-03: 12:00:00 2 g via INTRAVENOUS
  Filled 2020-01-03: qty 100

## 2020-01-03 MED ORDER — TRAMADOL HCL 50 MG PO TABS
100.0000 mg | ORAL_TABLET | Freq: Two times a day (BID) | ORAL | Status: DC | PRN
  Administered 2020-01-03: 18:00:00 100 mg via ORAL
  Filled 2020-01-03: qty 2

## 2020-01-03 MED ORDER — MEPERIDINE HCL 50 MG/ML IJ SOLN: 6.2500 mg | INTRAMUSCULAR | Status: DC | PRN

## 2020-01-03 MED ORDER — AMISULPRIDE (ANTIEMETIC) 5 MG/2ML IV SOLN
INTRAVENOUS | Status: AC
  Administered 2020-01-03: 15:00:00 10 mg via INTRAVENOUS
  Filled 2020-01-03: qty 4

## 2020-01-03 MED ORDER — ONDANSETRON HCL 4 MG/2ML IJ SOLN: 4.0000 mg | Freq: Four times a day (QID) | INTRAMUSCULAR | Status: DC | PRN

## 2020-01-03 MED ORDER — LIDOCAINE HCL (PF) 2 % IJ SOLN
INTRAMUSCULAR | Status: AC
  Filled 2020-01-03: qty 5

## 2020-01-03 MED ORDER — LIDOCAINE HCL (CARDIAC) PF 100 MG/5ML IV SOSY
PREFILLED_SYRINGE | INTRAVENOUS | Status: DC | PRN
  Administered 2020-01-03: 80 mg via INTRAVENOUS

## 2020-01-03 MED ORDER — ONDANSETRON HCL 4 MG/2ML IJ SOLN
INTRAMUSCULAR | Status: AC
  Filled 2020-01-03: qty 2

## 2020-01-03 MED ORDER — BOOST HIGH PROTEIN PO LIQD
237.0000 mL | Freq: Every day | ORAL | Status: DC
  Filled 2020-01-03: qty 237

## 2020-01-03 MED ORDER — KETAMINE HCL 10 MG/ML IJ SOLN
INTRAMUSCULAR | Status: DC | PRN
  Administered 2020-01-03: 30 mg via INTRAVENOUS

## 2020-01-03 MED ORDER — LACTATED RINGERS IR SOLN
Status: DC | PRN
  Administered 2020-01-03: 1000 mL

## 2020-01-03 MED ORDER — ENOXAPARIN SODIUM 40 MG/0.4ML ~~LOC~~ SOLN: 40.0000 mg | SUBCUTANEOUS | Status: DC

## 2020-01-03 MED ORDER — ENOXAPARIN SODIUM 40 MG/0.4ML ~~LOC~~ SOLN
40.0000 mg | SUBCUTANEOUS | Status: AC
  Administered 2020-01-03: 09:00:00 40 mg via SUBCUTANEOUS
  Filled 2020-01-03: qty 0.4

## 2020-01-03 MED ORDER — GLIMEPIRIDE 2 MG PO TABS
2.0000 mg | ORAL_TABLET | Freq: Every day | ORAL | Status: DC
  Administered 2020-01-04: 09:00:00 2 mg via ORAL
  Filled 2020-01-03: qty 1

## 2020-01-03 MED ORDER — BUPIVACAINE HCL 0.25 % IJ SOLN
INTRAMUSCULAR | Status: DC | PRN
  Administered 2020-01-03: 25 mL

## 2020-01-03 MED ORDER — LIDOCAINE 2% (20 MG/ML) 5 ML SYRINGE
INTRAMUSCULAR | Status: DC | PRN
  Administered 2020-01-03: 1.5 mg/kg/h via INTRAVENOUS

## 2020-01-03 MED ORDER — PROPOFOL 10 MG/ML IV BOLUS
INTRAVENOUS | Status: DC | PRN
  Administered 2020-01-03: 20 mg via INTRAVENOUS
  Administered 2020-01-03: 160 mg via INTRAVENOUS

## 2020-01-03 MED ORDER — ONDANSETRON HCL 4 MG PO TABS
4.0000 mg | ORAL_TABLET | Freq: Four times a day (QID) | ORAL | Status: DC | PRN
  Filled 2020-01-03: qty 1

## 2020-01-03 MED ORDER — LACTATED RINGERS IV SOLN: INTRAVENOUS | Status: DC | PRN

## 2020-01-03 MED ORDER — SENNOSIDES-DOCUSATE SODIUM 8.6-50 MG PO TABS
2.0000 | ORAL_TABLET | Freq: Every day | ORAL | Status: DC
  Filled 2020-01-03: qty 2

## 2020-01-03 MED ORDER — FENTANYL CITRATE (PF) 100 MCG/2ML IJ SOLN
25.0000 ug | INTRAMUSCULAR | Status: DC | PRN
  Administered 2020-01-03 (×2): 50 ug via INTRAVENOUS

## 2020-01-03 MED ORDER — HYDROMORPHONE HCL 1 MG/ML IJ SOLN: 0.5000 mg | INTRAMUSCULAR | Status: DC | PRN

## 2020-01-03 MED ORDER — BUPIVACAINE HCL 0.25 % IJ SOLN
INTRAMUSCULAR | Status: AC
  Filled 2020-01-03: qty 1

## 2020-01-03 MED ORDER — ACETAMINOPHEN 500 MG PO TABS
1000.0000 mg | ORAL_TABLET | Freq: Two times a day (BID) | ORAL | Status: DC
  Administered 2020-01-03 – 2020-01-04 (×2): 1000 mg via ORAL
  Filled 2020-01-03 (×2): qty 2

## 2020-01-03 MED ORDER — CHLORHEXIDINE GLUCONATE 0.12 % MT SOLN
15.0000 mL | Freq: Once | OROMUCOSAL | Status: AC
  Administered 2020-01-03: 09:00:00 15 mL via OROMUCOSAL

## 2020-01-03 MED ORDER — FENTANYL CITRATE (PF) 250 MCG/5ML IJ SOLN
INTRAMUSCULAR | Status: DC | PRN
  Administered 2020-01-03: 50 ug via INTRAVENOUS
  Administered 2020-01-03: 100 ug via INTRAVENOUS
  Administered 2020-01-03: 50 ug via INTRAVENOUS
  Administered 2020-01-03: 100 ug via INTRAVENOUS
  Administered 2020-01-03: 50 ug via INTRAVENOUS

## 2020-01-03 MED ORDER — PHENYLEPHRINE 40 MCG/ML (10ML) SYRINGE FOR IV PUSH (FOR BLOOD PRESSURE SUPPORT)
PREFILLED_SYRINGE | INTRAVENOUS | Status: DC | PRN
  Administered 2020-01-03 (×2): 80 ug via INTRAVENOUS

## 2020-01-03 MED ORDER — ORAL CARE MOUTH RINSE: 15.0000 mL | Freq: Once | OROMUCOSAL | Status: AC

## 2020-01-03 MED ORDER — DEXAMETHASONE SODIUM PHOSPHATE 10 MG/ML IJ SOLN
INTRAMUSCULAR | Status: DC | PRN
  Administered 2020-01-03: 4 mg via INTRAVENOUS

## 2020-01-03 MED ORDER — STERILE WATER FOR IRRIGATION IR SOLN
Status: DC | PRN
  Administered 2020-01-03: 1000 mL

## 2020-01-03 MED ORDER — FAMOTIDINE 20 MG PO TABS: 20.0000 mg | ORAL_TABLET | Freq: Two times a day (BID) | ORAL | Status: DC | PRN

## 2020-01-03 MED ORDER — ROCURONIUM BROMIDE 10 MG/ML (PF) SYRINGE
PREFILLED_SYRINGE | INTRAVENOUS | Status: DC | PRN
  Administered 2020-01-03: 60 mg via INTRAVENOUS
  Administered 2020-01-03: 20 mg via INTRAVENOUS

## 2020-01-03 MED ORDER — ACETAMINOPHEN 160 MG/5ML PO SOLN: 325.0000 mg | ORAL | Status: DC | PRN

## 2020-01-03 MED ORDER — LABETALOL HCL 5 MG/ML IV SOLN
INTRAVENOUS | Status: AC
  Filled 2020-01-03: qty 4

## 2020-01-03 MED ORDER — ACETAMINOPHEN 500 MG PO TABS
1000.0000 mg | ORAL_TABLET | ORAL | Status: AC
  Administered 2020-01-03: 09:00:00 1000 mg via ORAL
  Filled 2020-01-03: qty 2

## 2020-01-03 MED ORDER — INSULIN ASPART 100 UNIT/ML ~~LOC~~ SOLN
0.0000 [IU] | Freq: Three times a day (TID) | SUBCUTANEOUS | Status: DC
  Administered 2020-01-04: 09:00:00 2 [IU] via SUBCUTANEOUS

## 2020-01-03 MED ORDER — SENNOSIDES-DOCUSATE SODIUM 8.6-50 MG PO TABS: 2.0000 | ORAL_TABLET | Freq: Every day | ORAL | Status: DC

## 2020-01-03 MED ORDER — ONDANSETRON HCL 4 MG/2ML IJ SOLN
4.0000 mg | Freq: Once | INTRAMUSCULAR | Status: AC | PRN
  Administered 2020-01-03: 15:00:00 4 mg via INTRAVENOUS

## 2020-01-03 MED ORDER — ONDANSETRON HCL 4 MG/2ML IJ SOLN
INTRAMUSCULAR | Status: DC | PRN
  Administered 2020-01-03: 4 mg via INTRAVENOUS

## 2020-01-03 MED ORDER — KCL IN DEXTROSE-NACL 20-5-0.45 MEQ/L-%-% IV SOLN
INTRAVENOUS | Status: DC
  Filled 2020-01-03: qty 1000

## 2020-01-03 SURGICAL SUPPLY — 68 items
APPLICATOR SURGIFLO ENDO (HEMOSTASIS) ×2 IMPLANT
BACTOSHIELD CHG 4% 4OZ (MISCELLANEOUS) ×1
BAG LAPAROSCOPIC 12 15 PORT 16 (BASKET) IMPLANT
BAG RETRIEVAL 12/15 (BASKET)
BLADE SURG SZ10 CARB STEEL (BLADE) IMPLANT
COVER BACK TABLE 60X90IN (DRAPES) ×2 IMPLANT
COVER TIP SHEARS 8 DVNC (MISCELLANEOUS) ×1 IMPLANT
COVER TIP SHEARS 8MM DA VINCI (MISCELLANEOUS) ×2
COVER WAND RF STERILE (DRAPES) IMPLANT
DECANTER SPIKE VIAL GLASS SM (MISCELLANEOUS) ×2 IMPLANT
DERMABOND ADVANCED (GAUZE/BANDAGES/DRESSINGS) ×1
DERMABOND ADVANCED .7 DNX12 (GAUZE/BANDAGES/DRESSINGS) ×1 IMPLANT
DRAPE ARM DVNC X/XI (DISPOSABLE) ×4 IMPLANT
DRAPE COLUMN DVNC XI (DISPOSABLE) ×1 IMPLANT
DRAPE DA VINCI XI ARM (DISPOSABLE) ×8
DRAPE DA VINCI XI COLUMN (DISPOSABLE) ×2
DRAPE SHEET LG 3/4 BI-LAMINATE (DRAPES) ×2 IMPLANT
DRAPE SURG IRRIG POUCH 19X23 (DRAPES) ×2 IMPLANT
DRSG OPSITE POSTOP 4X6 (GAUZE/BANDAGES/DRESSINGS) IMPLANT
DRSG OPSITE POSTOP 4X8 (GAUZE/BANDAGES/DRESSINGS) IMPLANT
ELECT PENCIL ROCKER SW 15FT (MISCELLANEOUS) IMPLANT
ELECT REM PT RETURN 15FT ADLT (MISCELLANEOUS) ×2 IMPLANT
GLOVE BIO SURGEON STRL SZ 6 (GLOVE) ×8 IMPLANT
GLOVE BIO SURGEON STRL SZ 6.5 (GLOVE) ×4 IMPLANT
GOWN STRL REUS W/ TWL LRG LVL3 (GOWN DISPOSABLE) ×4 IMPLANT
GOWN STRL REUS W/TWL LRG LVL3 (GOWN DISPOSABLE) ×8
HOLDER FOLEY CATH W/STRAP (MISCELLANEOUS) IMPLANT
IRRIG SUCT STRYKERFLOW 2 WTIP (MISCELLANEOUS) ×2
IRRIGATION SUCT STRKRFLW 2 WTP (MISCELLANEOUS) ×1 IMPLANT
KIT PROCEDURE DA VINCI SI (MISCELLANEOUS)
KIT PROCEDURE DVNC SI (MISCELLANEOUS) IMPLANT
KIT TURNOVER KIT A (KITS) IMPLANT
MANIPULATOR UTERINE 4.5 ZUMI (MISCELLANEOUS) ×2 IMPLANT
NEEDLE HYPO 22GX1.5 SAFETY (NEEDLE) ×2 IMPLANT
NEEDLE SPNL 18GX3.5 QUINCKE PK (NEEDLE) IMPLANT
OBTURATOR OPTICAL STANDARD 8MM (TROCAR) ×2
OBTURATOR OPTICAL STND 8 DVNC (TROCAR) ×1
OBTURATOR OPTICALSTD 8 DVNC (TROCAR) ×1 IMPLANT
PACK ROBOT GYN CUSTOM WL (TRAY / TRAY PROCEDURE) ×2 IMPLANT
PAD POSITIONING PINK XL (MISCELLANEOUS) ×2 IMPLANT
PORT ACCESS TROCAR AIRSEAL 12 (TROCAR) ×1 IMPLANT
PORT ACCESS TROCAR AIRSEAL 5M (TROCAR) ×1
POUCH SPECIMEN RETRIEVAL 10MM (ENDOMECHANICALS) ×6 IMPLANT
SCRUB CHG 4% DYNA-HEX 4OZ (MISCELLANEOUS) ×1 IMPLANT
SEAL CANN UNIV 5-8 DVNC XI (MISCELLANEOUS) ×3 IMPLANT
SEAL XI 5MM-8MM UNIVERSAL (MISCELLANEOUS) ×6
SET TRI-LUMEN FLTR TB AIRSEAL (TUBING) ×2 IMPLANT
SPONGE LAP 18X18 RF (DISPOSABLE) IMPLANT
SPONGE LAP 4X18 RFD (DISPOSABLE) ×2 IMPLANT
SURGIFLO W/THROMBIN 8M KIT (HEMOSTASIS) ×4 IMPLANT
SUT MNCRL AB 4-0 PS2 18 (SUTURE) IMPLANT
SUT PDS AB 1 TP1 96 (SUTURE) IMPLANT
SUT VIC AB 0 CT1 27 (SUTURE)
SUT VIC AB 0 CT1 27XBRD ANTBC (SUTURE) IMPLANT
SUT VIC AB 2-0 CT1 27 (SUTURE)
SUT VIC AB 2-0 CT1 TAPERPNT 27 (SUTURE) IMPLANT
SUT VIC AB 3-0 SH 27 (SUTURE) ×2
SUT VIC AB 3-0 SH 27XBRD (SUTURE) ×1 IMPLANT
SUT VIC AB 4-0 PS2 18 (SUTURE) ×4 IMPLANT
SYR 10ML LL (SYRINGE) IMPLANT
SYR 20ML LL LF (SYRINGE) IMPLANT
SYR 50ML LL SCALE MARK (SYRINGE) IMPLANT
TOWEL OR NON WOVEN STRL DISP B (DISPOSABLE) ×2 IMPLANT
TRAP SPECIMEN MUCUS 40CC (MISCELLANEOUS) IMPLANT
TRAY FOLEY MTR SLVR 16FR STAT (SET/KITS/TRAYS/PACK) ×2 IMPLANT
TROCAR XCEL NON-BLD 5MMX100MML (ENDOMECHANICALS) IMPLANT
UNDERPAD 30X36 HEAVY ABSORB (UNDERPADS AND DIAPERS) ×2 IMPLANT
WATER STERILE IRR 1000ML POUR (IV SOLUTION) ×2 IMPLANT

## 2020-01-03 NOTE — Anesthesia Postprocedure Evaluation (Signed)
Anesthesia Post Note  Patient: Laura [name]  Procedure(s) Performed: XI ROBOTIC ASSISTED BILATERAL SALPINGO OOPHORECTOMY, RADICAL TUMOR DEBULKING (N/A Abdomen) XI ROBOTIC PARA-AORTIC LYMPHADENECTOMY (N/A Abdomen)     Patient location during evaluation: PACU Anesthesia Type: General Level of consciousness: awake Pain management: pain level controlled Vital Signs Assessment: post-procedure vital signs reviewed and stable Respiratory status: spontaneous breathing Cardiovascular status: stable Anesthetic complications: yes (PONV)   No complications documented.  Last Vitals:  Vitals:   01/03/20 1714 01/03/20 1813  BP: (!) 169/90 (!) 166/88  Pulse: 88 79  Resp: 20 18  Temp: 36.6 C 36.7 C  SpO2: 99% 100%    Last Pain:  Vitals:   01/03/20 1813  TempSrc: Oral  PainSc:                  Huston Foley

## 2020-01-03 NOTE — Transfer of Care (Signed)
Immediate Anesthesia Transfer of Care Note  Patient: Laura [name]  Procedure(s) Performed: XI ROBOTIC ASSISTED BILATERAL SALPINGO OOPHORECTOMY, RADICAL TUMOR DEBULKING (N/A Abdomen) XI ROBOTIC PARA-AORTIC LYMPHADENECTOMY (N/A Abdomen)  Patient Location: PACU  Anesthesia Type:General  Level of Consciousness: drowsy and patient cooperative  Airway & Oxygen Therapy: Patient Spontanous Breathing and Patient connected to face mask oxygen  Post-op Assessment: Report given to RN and Post -op Vital signs reviewed and stable  Post vital signs: Reviewed and stable  Last Vitals:  Vitals Value Taken Time  BP 169/101 01/03/20 1442  Temp    Pulse 88 01/03/20 1445  Resp 16 01/03/20 1444  SpO2 100 % 01/03/20 1445  Vitals shown include unvalidated device data.  Last Pain:  Vitals:   01/03/20 0856  TempSrc: Oral  PainSc:       Patients Stated Pain Goal: 4 (90/21/11 5520)  Complications: No complications documented.

## 2020-01-03 NOTE — Anesthesia Procedure Notes (Signed)
Procedure Name: Intubation Date/Time: 01/03/2020 12:01 PM Performed by: Raenette Rover, CRNA Pre-anesthesia Checklist: Patient identified, Emergency Drugs available, Suction available and Patient being monitored Patient Re-evaluated:Patient Re-evaluated prior to induction Oxygen Delivery Method: Circle system utilized Preoxygenation: Pre-oxygenation with 100% oxygen Induction Type: IV induction Ventilation: Mask ventilation without difficulty Laryngoscope Size: Mac and 3 Grade View: Grade I Tube type: Oral Tube size: 7.0 mm Number of attempts: 1 Airway Equipment and Method: Stylet Placement Confirmation: ETT inserted through vocal cords under direct vision,  positive ETCO2 and breath sounds checked- equal and bilateral Secured at: 19 cm Tube secured with: Tape Dental Injury: Teeth and Oropharynx as per pre-operative assessment

## 2020-01-03 NOTE — Progress Notes (Signed)
Laura [name] stated that she takes vesicare at home. Currently c/o bladder discomfort. Physician notified via secure chat.

## 2020-01-03 NOTE — Op Note (Signed)
OPERATIVE NOTE 01/03/20  Surgeon: Donaciano Eva   Assistants: Dr Lahoma Crocker (an MD assistant was necessary for tissue manipulation, management of robotic instrumentation, retraction and positioning due to the complexity of the case and hospital policies).   Anesthesia: General endotracheal anesthesia  ASA Class: 3   Pre-operative Diagnosis: stage IIIC ovarian cancer, secondary malignant neoplasm to para-aortic (abdominal) lymph nodes, s/p chemotherapy, obesity  Post-operative Diagnosis: same  Operation: Robotic-assisted laparoscopic total hysterectomy with bilateral salpingoophorectomy   Surgeon: Donaciano Eva  Assistant Surgeon: Lahoma Crocker MD  Anesthesia: GET  Urine Output: 200cc  Operative Findings:  : 6cm bulky conglomerate of fluid filled node densely adherent to the vena cava at the level of the bifurcation of the common iliacs/vena cava at the pelvic brim. Adherent to the right ureter. Adherent to the IMA.  Normal upper abdomen, normal appearing omentum. Cystic lymphocyst in left para-aortic region filled with dark brown fluid.  Right ovary enlarged to 4cm and adherent to ovarian fossa.  Left ovary grossly normal. Surgically absent uterus.  No gross residual disease at completion of the procedure representing a complete debulking (R0).   Estimated Blood Loss:  less than 50 mL      Total IV Fluids: 800 ml         Specimens: 1/ para-aortic node 2/ right tube and ovary, 3/ left tube and ovary.          Complications:  None; patient tolerated the procedure well.         Disposition: PACU - hemodynamically stable.  Procedure Details  The patient was seen in the Holding Room. The risks, benefits, complications, treatment options, and expected outcomes were discussed with the patient.  The patient concurred with the proposed plan, giving informed consent.  The site of surgery properly noted/marked. The patient was identified as Laura [name] and  the procedure verified as a Robotic-assisted BSO with para-aortic lymphadenectomy and tumor debulking. A Time Out was held and the above information confirmed.  After induction of anesthesia, the patient was draped and prepped in the usual sterile manner. Pt was placed in supine position after anesthesia and draped and prepped in the usual sterile manner. The abdominal drape was placed after the CholoraPrep had been allowed to dry for 3 minutes.  Her arms were tucked to her side with all appropriate precautions.  The shoulders were stabilized with padded shoulder blocks applied to the acromium processes.  The patient was placed in the semi-lithotomy position in [place].  The perineum was prepped with Betadine. The patient was then prepped. Foley catheter was placed.  OG tube placement was confirmed and to suction.   Next, a 5 mm skin incision was made 1 cm below the subcostal margin in the midclavicular line.  The 5 mm Optiview port and scope was used for direct entry.  Opening pressure was under 10 mm CO2.  The abdomen was insufflated and the findings were noted as above.   At this point and all points during the procedure, the patient's intra-abdominal pressure did not exceed 15 mmHg. Next, a 10 mm skin incision was made 5cm above the umbilicus and a right and left port was placed about 10 cm lateral to the robot port on the right and left side.  A fourth arm was placed in the left lower quadrant 2 cm above and superior and medial to the anterior superior iliac spine.  All ports were placed under direct visualization.  The patient was placed in  steep Trendelenburg.  Bowel was folded away into the upper abdomen.  The robot was docked in the normal manner.  The tumor mass was visualized at the bifurcation of the aorta and vena cava above the pelvic brim in the midline.  Measured between 4 and 6 cm.  It was densely adherent to the underlying vascular structures and not free and mobile.  The peritoneum  overlying the lymph node mass was opened and scored using the monopolar scissors.  This facilitated entry into the retroperitoneal space.  The right ureter was carefully identified in the retroperitoneum and dissected with sharp dissection free from the lymph node mass.  Once skeletonized and mobilized lateral to the mass the ureter was held laterally with the fourth arm.  The left side of the dissection was approached by opening the peritoneum of the sigmoid colon mesentery.  In doing so the IMA was identified and skeletonized.  The structure was densely adherent to the left lateral border of the retroperitoneal mass.  It was carefully dissected free from the mass keeping the IMA intact throughout.  The sigmoid mesentery was carefully dissected from the left lateral boundary of the mass.  The posterior attachments of the mass to the sacral promontory, bilateral common iliac veins, and inferior aorta were then approached using sharp dissection.  Carefully the mass was dissected free from its dense attachments to the retroperitoneal vascular structures.  Judicious use of monopolar and bipolar energy The field hemostatic.  During resection of the lymph node mass there was entry into the necrotic core of the lymph node mass with liberation of clear, amber-colored fluid.  The mass was entirely resected from the underlying vascular and retroperitoneal structures and placed in Endo Catch bag.  Surgi-Flo was applied to the surgical field to reinforce hemostasis at sites of venous oozing.  The bilateral salpingo-oophorectomy was then performed.  The right retroperitoneal space in the pelvis was opened up lateral to the right ovarian vessels.  The retroperitoneum was entered and the pelvic ureter was identified deep to the ovarian vessels.  A window was created in the broad ligament above the level of the ureter which skeletonized the right ovarian vessels which were then bipolar sealed and transected.  The right ovary was  somewhat densely adherent to the ovarian fossa and the vaginal cuff.  Careful monopolar dissection freed the right ovary from its dense attachments.  Is placed in the Endo Catch bag and retrieved from the left upper quadrant.  The residual right uterine artery was bleeding after this dissection and a single interrupted 3-0 Vicryl suture was utilized to create hemostasis here.  Suture was utilized as opposed to energy due to the close proximity to the ureter.  A similar dissection was performed to liberate the left tube and ovary from its attachments to the left gonadal vessels on the left vaginal cuff.  In doing so the left ureter was identified in the retroperitoneum skeletonized free from the left gonadal vessels.  The specimen was less adherent to the ovarian fossa and was liberated with a more straightforward dissection.  It was placed in Endo Catch bag and removed from the left upper quadrant port.  All surgical sites were inspected for hemostasis and irrigation took place.  The right ureter was seen clearly vermiculate and through the surgical field of the para-aortic dissection free of any apparent injury.    Peritoneal inspection confirms no gross residual tumor remaining at the completion of the procedure representing a complete cytoreduction, R0.  All instruments and Ray-Tec's removed from the surgical field. The robot was undocked. The 10 mm ports were closed with Vicryl on a UR-5 needle and the fascia was closed with 0 Vicryl on a UR-5 needle.  The skin was closed with 4-0 Vicryl in a subcuticular manner.  Dermabond was applied.  Sponge, lap and needle counts correct x 2.  The patient was taken to the recovery room in stable condition.  The vagina was swabbed with  minimal bleeding noted.   All instrument and needle counts were correct x  3.   The patient was transferred to the recovery room in a stable condition.  Donaciano Eva, MD

## 2020-01-03 NOTE — Anesthesia Procedure Notes (Signed)
Arterial Line Insertion Start/End12/21/2021 12:00 PM, 01/03/2020 12:16 PM Performed by: Janeece Riggers, MD, anesthesiologist  Patient location: OR. Preanesthetic checklist: patient identified, IV checked, site marked, risks and benefits discussed, surgical consent, monitors and equipment checked, pre-op evaluation, timeout performed and anesthesia consent Lidocaine 1% used for infiltration Right, radial was placed Catheter size: 20 Fr Hand hygiene performed , maximum sterile barriers used  and Seldinger technique used Allen's test indicative of satisfactory collateral circulation Attempts: 1 Procedure performed without using ultrasound guided technique. Following insertion, dressing applied. Post procedure assessment: normal and unchanged

## 2020-01-03 NOTE — Interval H&P Note (Signed)
History and Physical Interval Note:  01/03/2020 11:08 AM  Laura [name]  has presented today for surgery, with the diagnosis of OVARIAN CANCER.  The various methods of treatment have been discussed with the patient and family. After consideration of risks, benefits and other options for treatment, the patient has consented to  Procedure(s): XI ROBOTIC ASSISTED BILATERAL SALPINGO OOPHORECTOMY, POSSIBLE LAPAROTOMY (N/A) XI ROBOTIC PARA-AORTIC LYMPH NODE DISSECTION (N/A) as a surgical intervention.  The patient's history has been reviewed, patient examined, no change in status, stable for surgery.  I have reviewed the patient's chart and labs.  Questions were answered to the patient's satisfaction.     Thereasa Solo

## 2020-01-03 NOTE — Anesthesia Preprocedure Evaluation (Addendum)
Anesthesia Evaluation  Patient identified by MRN, date of birth, ID band Patient awake    Reviewed: Allergy & Precautions, NPO status , Patient's Chart, lab work & pertinent test results  Airway Mallampati: I  TM Distance: >3 FB Neck ROM: Full    Dental no notable dental hx. (+) Partial Upper, Poor Dentition, Missing,    Pulmonary sleep apnea and Continuous Positive Airway Pressure Ventilation ,    Pulmonary exam normal breath sounds clear to auscultation       Cardiovascular hypertension, Pt. on medications Normal cardiovascular exam Rhythm:Regular Rate:Normal  ECG: SR, rate 79  ECHO: - Left ventricle: The cavity size was normal. Systolic function was normal. The estimated ejection fraction was in the range of 55% to 60%. Wall motion was normal; there were no regional wall motion abnormalities. - Left atrium: The atrium was mildly dilated. - Right atrium: The atrium was mildly dilated. - Atrial septum: No defect or patent foramen ovale was identified. - Pulmonary arteries: PA peak pressure: 56 mm Hg (S).   Neuro/Psych PSYCHIATRIC DISORDERS Anxiety negative neurological ROS     GI/Hepatic Neg liver ROS, GERD  Medicated,  Endo/Other  diabetes, Type 2, Oral Hypoglycemic Agents  Renal/GU negative Renal ROS  negative genitourinary   Musculoskeletal  (+) Arthritis , Osteoarthritis,    Abdominal (+) + obese,   Peds negative pediatric ROS (+)  Hematology  (+) Blood dyscrasia, anemia ,   Anesthesia Other Findings Hyperlipidemia  Vertigo Obese   Reproductive/Obstetrics negative OB ROS                            Anesthesia Physical  Anesthesia Plan  ASA: III  Anesthesia Plan: General   Post-op Pain Management:    Induction: Intravenous  PONV Risk Score and Plan: 3 and Ondansetron, Dexamethasone and Treatment may vary due to age or medical condition  Airway Management Planned: Oral  ETT  Additional Equipment: None  Intra-op Plan:   Post-operative Plan: Extubation in OR  Informed Consent: I have reviewed the patients History and Physical, chart, labs and discussed the procedure including the risks, benefits and alternatives for the proposed anesthesia with the patient or authorized representative who has indicated his/her understanding and acceptance.     Dental advisory given  Plan Discussed with: CRNA, Anesthesiologist and Surgeon  Anesthesia Plan Comments: (  )       Anesthesia Quick Evaluation

## 2020-01-04 ENCOUNTER — Encounter (HOSPITAL_COMMUNITY): Payer: Self-pay | Admitting: Gynecologic Oncology

## 2020-01-04 ENCOUNTER — Encounter (HOSPITAL_COMMUNITY): Payer: Self-pay

## 2020-01-04 LAB — CBC
HCT: 27.4 % — ABNORMAL LOW (ref 36.0–46.0)
HCT: 26.7 % — ABNORMAL LOW (ref 36.0–46.0)
Hemoglobin: 9.2 g/dL — ABNORMAL LOW (ref 12.0–15.0)
Hemoglobin: 9 g/dL — ABNORMAL LOW (ref 12.0–15.0)
MCH: 32.2 pg (ref 26.0–34.0)
MCH: 32.8 pg (ref 26.0–34.0)
MCHC: 33.6 g/dL (ref 30.0–36.0)
MCHC: 33.7 g/dL (ref 30.0–36.0)
MCV: 95.8 fL (ref 80.0–100.0)
MCV: 97.4 fL (ref 80.0–100.0)
Platelets: 125 10*3/uL — ABNORMAL LOW (ref 150–400)
Platelets: 121 10*3/uL — ABNORMAL LOW (ref 150–400)
RBC: 2.86 MIL/uL — ABNORMAL LOW (ref 3.87–5.11)
RBC: 2.74 MIL/uL — ABNORMAL LOW (ref 3.87–5.11)
RDW: 13.1 % (ref 11.5–15.5)
RDW: 13.2 % (ref 11.5–15.5)
WBC: 9.7 10*3/uL (ref 4.0–10.5)
WBC: 9.4 10*3/uL (ref 4.0–10.5)
nRBC: 0 % (ref 0.0–0.2)
nRBC: 0 % (ref 0.0–0.2)

## 2020-01-04 LAB — BASIC METABOLIC PANEL
Anion gap: 9 (ref 5–15)
BUN: 17 mg/dL (ref 8–23)
CO2: 24 mmol/L (ref 22–32)
Calcium: 9.2 mg/dL (ref 8.9–10.3)
Chloride: 104 mmol/L (ref 98–111)
Creatinine, Ser: 0.97 mg/dL (ref 0.44–1.00)
GFR, Estimated: >60 mL/min (ref >60)
Glucose, Bld: 149 mg/dL — ABNORMAL HIGH (ref 70–99)
Potassium: 4.2 mmol/L (ref 3.5–5.1)
Sodium: 137 mmol/L (ref 135–145)

## 2020-01-04 LAB — URINALYSIS, ROUTINE W REFLEX MICROSCOPIC
Bilirubin Urine: NEGATIVE
Glucose, UA: 50 mg/dL — AB
Hgb urine dipstick: NEGATIVE
Ketones, ur: NEGATIVE mg/dL
Leukocytes,Ua: NEGATIVE
Nitrite: NEGATIVE
Protein, ur: NEGATIVE mg/dL
Specific Gravity, Urine: 1.011 (ref 1.005–1.030)
pH: 5 (ref 5.0–8.0)

## 2020-01-04 LAB — GLUCOSE, CAPILLARY
Glucose-Capillary: 125 mg/dL — ABNORMAL HIGH (ref 70–99)
Glucose-Capillary: 115 mg/dL — ABNORMAL HIGH (ref 70–99)

## 2020-01-04 MED ORDER — ENOXAPARIN (LOVENOX) PATIENT EDUCATION KIT
PACK | Freq: Once | Status: AC
  Filled 2020-01-04: qty 1

## 2020-01-04 MED ORDER — SODIUM CHLORIDE 0.9% FLUSH
10.0000 mL | Freq: Two times a day (BID) | INTRAVENOUS | Status: DC
  Administered 2020-01-04: 11:00:00 10 mL

## 2020-01-04 MED ORDER — HEPARIN SOD (PORK) LOCK FLUSH 100 UNIT/ML IV SOLN
500.0000 [IU] | INTRAVENOUS | Status: AC | PRN
  Administered 2020-01-04: 500 [IU]
  Filled 2020-01-04: qty 5

## 2020-01-04 MED ORDER — ENSURE ENLIVE PO LIQD
237.0000 mL | Freq: Every day | ORAL | Status: DC
  Administered 2020-01-04: 11:00:00 237 mL via ORAL

## 2020-01-04 MED ORDER — CHLORHEXIDINE GLUCONATE CLOTH 2 % EX PADS: 6.0000 | MEDICATED_PAD | Freq: Every day | CUTANEOUS | Status: DC

## 2020-01-04 MED ORDER — ENOXAPARIN SODIUM 40 MG/0.4ML ~~LOC~~ SOLN
40.0000 mg | SUBCUTANEOUS | Status: DC
  Administered 2020-01-04: 14:00:00 40 mg via SUBCUTANEOUS
  Filled 2020-01-04: qty 0.4

## 2020-01-04 MED ORDER — SODIUM CHLORIDE 0.9% FLUSH: 10.0000 mL | INTRAVENOUS | Status: DC | PRN

## 2020-01-04 NOTE — Progress Notes (Signed)
Patient calls inquiring about home health being set up. Patient states that she would like home health set up for Lovenox injections, stating that she is in the hospital and is attempting to confirm the need for Lovenox injections post discharge. I encourage the patient to talk with the prescribing MD, as it is not Dr.Katragadda. I advise her that should she need Lovenox that home health needs can be met by the inpatient's transition of care team. Patient expresses frustration at this time by our clinic not being able to provide home health for medication administration prescribed by another MD. I apologize and explain that the prescribing MD will need to provide home health medication administration orders. Patient verbalizes understanding and the conversation ends abruptly.

## 2020-01-04 NOTE — Progress Notes (Signed)
D/C instructions given, denies any questions. Lovenox teaching complete with return demonstration. Patient feels confident about doing the injections herself.

## 2020-01-04 NOTE — Discharge Instructions (Signed)
01/04/2020  YOU WILL NEED TO GIVE YOURSELF ONCE DAILY LOVENOX INJECTIONS AROUND THE SAME TIME EACH DAY STARTING TOMORROW, DEC 23, FOR A TOTAL OF 2 WEEKS TO PREVENT BLOOD CLOTS.   Return to work: 4-6 weeks if applicable  Activity: 1. Be up and out of the bed during the day.  Take a nap if needed.  You may walk up steps but be careful and use the hand rail.  Stair climbing will tire you more than you think, you may need to stop part way and rest.   2. No lifting or straining for 6 weeks.  3. No driving for 1 week(s) if you were cleared to drive before surgery.  Do not drive if you are taking narcotic pain medicine.  4. Shower daily.  Use soap and water on your incision and pat dry; don't rub.  No tub baths until cleared by your surgeon.   5. No sexual activity and nothing in the vagina for 4 weeks.  6. You may experience a small amount of clear drainage from your incisions, which is normal.  If the drainage persists or increases, please call the office.  7. Take Tylenol or ibuprofen first for pain and only use narcotic pain medication for severe pain not relieved by the Tylenol or Ibuprofen.  Monitor your Tylenol intake to a max of 4,000 mg.  Diet: 1. Low sodium Heart Healthy Diet is recommended.  2. It is safe to use a laxative, such as Miralax or Colace, if you have difficulty moving your bowels. You can take Sennakot at bedtime every evening to keep bowel movements regular and to prevent constipation.    Wound Care: 1. Keep clean and dry.  Shower daily.  Reasons to call the Doctor:  Fever - Oral temperature greater than 100.4 degrees Fahrenheit  Foul-smelling vaginal discharge  Difficulty urinating  Nausea and vomiting  Increased pain at the site of the incision that is unrelieved with pain medicine.  Difficulty breathing with or without chest pain  New calf pain especially if only on one side  Sudden, continuing increased vaginal bleeding with or without clots.    Contacts: For questions or concerns you should contact:  Dr. Everitt Amber at [phone]  Joylene John, NP at [phone]  After Hours: call [phone] and have the GYN Oncologist paged/contacted  Enoxaparin injection What is this medicine? ENOXAPARIN (ee nox a PA rin) is used after knee, hip, or abdominal surgeries to prevent blood clotting. It is also used to treat existing blood clots in the lungs or in the veins. This medicine may be used for other purposes; ask your health care provider or pharmacist if you have questions. COMMON BRAND NAME(S): Lovenox What should I tell my health care provider before I take this medicine? They need to know if you have any of these conditions:  bleeding disorders, hemorrhage, or hemophilia  infection of the heart or heart valves  kidney or liver disease  previous stroke  prosthetic heart valve  recent surgery or delivery of a baby  ulcer in the stomach or intestine, diverticulitis, or other bowel disease  an unusual or allergic reaction to enoxaparin, heparin, pork or pork products, other medicines, foods, dyes, or preservatives  pregnant or trying to get pregnant  breast-feeding How should I use this medicine? This medicine is for injection under the skin. It is usually given by a health-care professional. You or a family member may be trained on how to give the injections. If you are to  give yourself injections, make sure you understand how to use the syringe, measure the dose if necessary, and give the injection. To avoid bruising, do not rub the site where this medicine has been injected. Do not take your medicine more often than directed. Do not stop taking except on the advice of your doctor or health care professional. Make sure you receive a puncture-resistant container to dispose of the needles and syringes once you have finished with them. Do not reuse these items. Return the container to your doctor or health care  professional for proper disposal. Talk to your pediatrician regarding the use of this medicine in children. Special care may be needed. Overdosage: If you think you have taken too much of this medicine contact a poison control center or emergency room at once. NOTE: This medicine is only for you. Do not share this medicine with others. What if I miss a dose? If you miss a dose, take it as soon as you can. If it is almost time for your next dose, take only that dose. Do not take double or extra doses. What may interact with this medicine?  aspirin and aspirin-like medicines  certain medicines that treat or prevent blood clots  dipyridamole  NSAIDs, medicines for pain and inflammation, like ibuprofen or naproxen This list may not describe all possible interactions. Give your health care provider a list of all the medicines, herbs, non-prescription drugs, or dietary supplements you use. Also tell them if you smoke, drink alcohol, or use illegal drugs. Some items may interact with your medicine. What should I watch for while using this medicine? Visit your healthcare professional for regular checks on your progress. You may need blood work done while you are taking this medicine. Your condition will be monitored carefully while you are receiving this medicine. It is important not to miss any appointments. If you are going to need surgery or other procedure, tell your healthcare professional that you are using this medicine. Using this medicine for a long time may weaken your bones and increase the risk of bone fractures. Avoid sports and activities that might cause injury while you are using this medicine. Severe falls or injuries can cause unseen bleeding. Be careful when using sharp tools or knives. Consider using an Copy. Take special care brushing or flossing your teeth. Report any injuries, bruising, or red spots on the skin to your healthcare professional. Wear a medical ID bracelet  or chain. Carry a card that describes your disease and details of your medicine and dosage times. What side effects may I notice from receiving this medicine? Side effects that you should report to your doctor or health care professional as soon as possible:  allergic reactions like skin rash, itching or hives, swelling of the face, lips, or tongue  bone pain  signs and symptoms of bleeding such as bloody or black, tarry stools; red or dark-brown urine; spitting up blood or brown material that looks like coffee grounds; red spots on the skin; unusual bruising or bleeding from the eye, gums, or nose  signs and symptoms of a blood clot such as chest pain; shortness of breath; pain, swelling, or warmth in the leg  signs and symptoms of a stroke such as changes in vision; confusion; trouble speaking or understanding; severe headaches; sudden numbness or weakness of the face, arm or leg; trouble walking; dizziness; loss of coordination Side effects that usually do not require medical attention (report to your doctor or health care professional  if they continue or are bothersome):  hair loss  pain, redness, or irritation at site where injected This list may not describe all possible side effects. Call your doctor for medical advice about side effects. You may report side effects to FDA at 1-800-FDA-1088. Where should I keep my medicine? Keep out of the reach of children. Store at room temperature between 15 and 30 degrees C (59 and 86 degrees F). Do not freeze. If your injections have been specially prepared, you may need to store them in the refrigerator. Ask your pharmacist. Throw away any unused medicine after the expiration date. NOTE: This sheet is a summary. It may not cover all possible information. If you have questions about this medicine, talk to your doctor, pharmacist, or health care provider.  2020 Elsevier/Gold Standard (2016-12-25 11:25:34)

## 2020-01-04 NOTE — Progress Notes (Signed)
Patient ID: Laura [name], female   DOB: February 28, 1945, 74 y.o.   MRN: [id number] 1 Day Post-Op Procedure(s) (LRB): XI ROBOTIC ASSISTED BILATERAL SALPINGO OOPHORECTOMY, RADICAL TUMOR DEBULKING (N/A) XI ROBOTIC PARA-AORTIC LYMPHADENECTOMY (N/A)  Subjective: Patient reports mild pelvic cramping, abdominal tenderness.  Emesis last pm.  No nausea at present.  Felt "wobbly" when ambulating yesterday.  Objective: Vital signs in last 24 hours: Temp:  [97.8 F (36.6 C)-98.2 F (36.8 C)] 98.2 F (36.8 C) (12/22 0634) Pulse Rate:  [70-106] 75 (12/22 0634) Resp:  [12-22] 18 (12/22 0634) BP: (125-203)/(54-113) 137/65 (12/22 0634) SpO2:  [99 %-100 %] 99 % (12/22 0634) Arterial Line BP: (215-263)/(79-98) 215/90 (12/21 1645) Last BM Date: 01/02/20  Intake/Output from previous day: 12/21 0701 - 12/22 0700 In: 2920 [P.O.:720; I.V.:2200] Out: 750 [Urine:700; Blood:50]  Physical Examination: General: alert GI: soft, non-tender; bowel sounds normal; no masses,  no organomegaly Extremities: extremities normal, atraumatic, no cyanosis or edema and Homans sign is negative, no sign of DVT  Labs: WBC/Hgb/Hct/Plts:  9.7/9.2/27.4/125 (12/22 4742) BUN/Cr/glu/ALT/AST/amyl/lip:  17/0.97/--/--/--/--/-- (12/22 5956)  CBG (last 3)  Recent Labs    01/03/20 1617 01/03/20 2121 01/04/20 0801  GLUCAP 196* 240* 125*    Assessment:  74 y.o. s/p Procedure(s): XI ROBOTIC ASSISTED BILATERAL SALPINGO OOPHORECTOMY, RADICAL TUMOR DEBULKING XI ROBOTIC PARA-AORTIC LYMPHADENECTOMY: stable Pain:  Pain is well-controlled on oral medications.  Heme: Moderate postop anemia--downward trend more that predicted w/EBL intraop.  Likely dilutional--?symptoms.  Hemodynamically stable. Stable mild thrombocytopenia  CV: H/O chronic HTN.  Labile B/Ps intraop.  B/Ps controlled on current regimen.   FEN: Borderline urine output. Electrolytes in range.  Endo: H/O A2DM.  CBGs downward trending following stress of surgery.  Goal  CBGs < 180  Prophylaxis: intermittent pneumatic compression boots.   Plan: >hold Lovenox until repeat CBC results >encouraged ambulation Dispo:  Possible discharge later today   LOS: 1 day    Lahoma Crocker, MD 01/04/2020, 9:46 AM

## 2020-01-04 NOTE — Progress Notes (Signed)
RN called for Pt requesting CPAP.  Pt has not worn CPAP in about a year.  Pt placed on Auto titration mode via small nasal mask.  Pt complaining that it didn't relieve her symptoms that she was experiencing and that she felt like she was choking.  Pt requesting RT to take CPAP off.  Pt currently on room air and vitals are WNL, RN aware.

## 2020-01-04 NOTE — Discharge Summary (Signed)
Physician Discharge Summary  Patient ID: Laura [name] MRN: [id number] DOB/AGE: [birth year] 74 y.o.  Admit date: 01/03/2020 Discharge date: 01/04/2020  Admission Diagnoses: Ovarian cancer [facility])  Discharge Diagnoses:  Principal Problem:   Ovarian cancer [facility]) Active Problems:   Obesity   Secondary malignant neoplasm of intra-abdominal lymph nodes [facility])   Discharged Condition:  The patient is in good condition and stable for discharge.    Hospital Course: On 01/03/2020, the patient underwent the following: Procedure(s): XI ROBOTIC ASSISTED BILATERAL SALPINGO OOPHORECTOMY, RADICAL TUMOR DEBULKING XI ROBOTIC PARA-AORTIC LYMPHADENECTOMY. The postoperative course was uneventful.  She was discharged to home on postoperative day 1 tolerating a regular diet, ambulating, pain controlled, voiding, instructed on lovenox injections.  Consults: None  Significant Diagnostic Studies: None  Treatments: surgery: see above  Discharge Exam (AM assessment with Dr. Merrilee Jansky): Blood pressure (!) 155/73, pulse 85, temperature 98.2 F (36.8 C), temperature source Oral, resp. rate 16, height _0  (1.626 m), weight 183 lb (83 kg), SpO2 98 %. General appearance: alert, cooperative and no distress GI: soft, non-tender; bowel sounds normal; no masses,  no organomegaly Extremities: extremities normal, atraumatic, no cyanosis or edema Incision/Wound: lap sites to the abdomen with dermabond without erythema or drainage  Disposition: Discharge disposition: 01-Home or Self Care       Discharge Instructions    Call MD for:  difficulty breathing, headache or visual disturbances   Complete by: As directed    Call MD for:  extreme fatigue   Complete by: As directed    Call MD for:  hives   Complete by: As directed    Call MD for:  persistant dizziness or light-headedness   Complete by: As directed    Call MD for:  persistant nausea and vomiting   Complete by: As directed    Call MD for:  redness,  tenderness, or signs of infection (pain, swelling, redness, odor or green/yellow discharge around incision site)   Complete by: As directed    Call MD for:  severe uncontrolled pain   Complete by: As directed    Call MD for:  temperature >100.4   Complete by: As directed    Diet - low sodium heart healthy   Complete by: As directed    Driving Restrictions   Complete by: As directed    No driving for 1 week.  Do not take narcotics and drive.   Increase activity slowly   Complete by: As directed    Lifting restrictions   Complete by: As directed    No lifting greater than 10 lbs.   Sexual Activity Restrictions   Complete by: As directed    No sexual activity, nothing in the vagina, for 4 weeks.     Allergies as of 01/04/2020      Reactions   Ciprofloxacin Swelling   Torn tendon   Hydrocodone-homatropine Other (See Comments)   Vertigo *pt strongly prefers to never take*   Sulfa Antibiotics Hives, Itching, Swelling   Tongue swells   Augmentin [amoxicillin-pot Clavulanate]    Diarrhea; can take PCN/ Amoxicillin   Codeine Itching   Crestor [rosuvastatin Calcium] Other (See Comments)   Did something to memory    Doxycycline Other (See Comments)   Severe rectal Gas.   Gabapentin    disoriented   Keflex [cephalexin] Diarrhea, Nausea And Vomiting   Naproxen Other (See Comments)   Stomach cramps   Statins    Muscle weakness   Prednisone Anxiety   *pt strongly prefers to  never be given prednisone*      Medication List    TAKE these medications   acetaminophen 650 MG CR tablet Commonly known as: TYLENOL Take 650 mg by mouth every 8 (eight) hours as needed for pain.   ALPRAZolam 0.25 MG tablet Commonly known as: XANAX Take 1 tablet (0.25 mg total) by mouth at bedtime as needed for anxiety.   amLODipine 5 MG tablet Commonly known as: NORVASC Take 5 mg by mouth daily.   CARBOPLATIN IV Inject into the vein every 21 ( twenty-one) days.   docusate sodium 100 MG  capsule Commonly known as: COLACE Take 100-200 mg by mouth 2 (two) times daily as needed (constipation.).   enoxaparin 40 MG/0.4ML injection Commonly known as: LOVENOX Inject 0.4 mLs (40 mg total) into the skin daily for 14 days. For AFTER surgery only   eszopiclone 2 MG Tabs tablet Commonly known as: LUNESTA Take 1 tablet (2 mg total) by mouth at bedtime as needed for sleep. Take immediately before bedtime   famotidine 20 MG tablet Commonly known as: PEPCID Take 20 mg by mouth 2 (two) times daily as needed for heartburn or indigestion.   feeding supplement Liqd Take 237 mLs by mouth daily.   glimepiride 2 MG tablet Commonly known as: AMARYL TAKE ONE TABLET BY MOUTH DAILY What changed:   how much to take  how to take this  when to take this  additional instructions   HYDROmorphone 2 MG tablet Commonly known as: Dilaudid Take 0.5-1 tablets (1-2 mg total) by mouth every 4 (four) hours as needed for severe pain.   Lidocaine HCl 4 % Crea Apply 1 application topically 3 (three) times daily as needed (ankle pain.).   loperamide 2 MG capsule Commonly known as: IMODIUM Take 2 mg by mouth as needed for diarrhea or loose stools.   Loperamide-Simethicone 2-125 MG Tabs Take 1-2 tablets by mouth 4 (four) times daily as needed (diarrhea/loose stools).   loratadine 10 MG tablet Commonly known as: CLARITIN Take 10 mg by mouth daily as needed (allergies.).   losartan-hydrochlorothiazide 100-12.5 MG tablet Commonly known as: HYZAAR Take 1 tablet by mouth daily.   ONE TOUCH ULTRA 2 w/Device Kit Use as directed   OneTouch Ultra test strip Generic drug: glucose blood USE TO CHECK BLOOD SUGARS TWO TIMES DAILY   onetouch ultrasoft lancets USE TO CHECK BLOOD SUGAR TWICE DAILY   OVER THE COUNTER MEDICATION Apply 1 application topically daily as needed (pain). Triderma otc pain cream   prochlorperazine 10 MG tablet Commonly known as: COMPAZINE Take 10 mg by mouth every 6  (six) hours as needed for nausea or vomiting.   senna 8.6 MG Tabs tablet Commonly known as: SENOKOT Take 1 tablet by mouth daily as needed for mild constipation.   solifenacin 5 MG tablet Commonly known as: VESIcare Take 1 tablet (5 mg total) by mouth daily. What changed:   when to take this  reasons to take this   traMADol 50 MG tablet Commonly known as: ULTRAM Take 50 mg by mouth 2 (two) times daily as needed (ankle pain.).   VICKS VAPORUB EX Apply 1 application topically.   vitamin B-12 1000 MCG tablet Commonly known as: CYANOCOBALAMIN Take 1,000 mcg by mouth daily.   Vitamin D3 125 MCG (5000 UT) Tabs Take 10,000-15,000 Units by mouth daily.       Follow-up Information    Everitt Amber, MD Follow up on 01/25/2020.   Specialty: Gynecologic Oncology Why: at 1:30pm at the  [facility] information: [address] [place] [address] [phone]               Greater than thirty minutes were spend for face to face discharge instructions and discharge orders/summary in EPIC.   Signed: Dorothyann Gibbs 01/04/2020, 1:27 PM

## 2020-01-05 ENCOUNTER — Telehealth: Payer: Self-pay

## 2020-01-05 NOTE — Telephone Encounter (Signed)
Laura [name] states that she is eating,drinking, and urinating well. Passing gas. She had a good BM today. Afebrile. Incisions D&I She is not in any pain. Using Tylenol as needed. Pt aware of post op appointments as well as the office number [phone] and after hours number [phone] to call if she has any questions or concerns

## 2020-01-09 LAB — SURGICAL PATHOLOGY

## 2020-01-18 ENCOUNTER — Other Ambulatory Visit: Payer: Self-pay | Admitting: Internal Medicine

## 2020-01-18 NOTE — Progress Notes (Signed)
Follow-up Note: Gyn-Onc  Consult was requested by Dr. Delton Coombes for the evaluation of Laura [name] 75 y.o. female  CC:  Chief Complaint  Patient presents with  . Primary ovarian adenocarcinoma, unspecified laterality ([place])  . Counseling and coordination of care    Assessment/Plan:  Ms. ADALIDA GARVER  is a 75 y.o.  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 She has had complete pathologic response based on surgery in December, 2021.  Carboplatin and paclitaxel x 6 completed on 11/17/19.    Given the absence of viable tumor in her pathology and her poor tolerance of chemotherapy (toxicities accumulating), I recommend transitioning to surveillance at this time and no additional chemotherapy for now.  Otherwise, I will alternate follow-up exams at 3 monthly intervals with Dr Delton Coombes. We will draw CA 125 at these evaluations.   HPI: Chandrea Zellman is a 75 year old P3 who was seen in consultation at the request of Dr Delton Coombes for evaluation of stage IIIC high grade serous carcinoma of presumed ovary or fallopian tube.   The patient's history began in March, 2021 when she began experiencing flank and abdominal pains. These were evaluated at [facility] with a CT abd/pelvis which showed mixed solid and cystic lesions within the retroperitoneum centered predominantly in the aortocaval interval and tracking into the right iliac chain.  The largest solid component appeared to measure 5.9 x 4.3 x 6.4 cm.  To the left of the aorta was an additional lesion measuring approximately 4 x 3.6 x 4.6 cm.  The lesions partially encased to the abdominal aorta as well as the IMA origin, proximal right renal artery, and right common iliac.  There was compression of the IVC with loss of clearly discernible fat plane and a small segment concerning for possible intraluminal extension.  The uterus was surgically absent and there were  no concerning adnexal masses seen.  No obvious distant metastatic disease was identified.  There was severe right hydroureter ureter nephrosis to the level of the mid ureter.  This was likely secondary to occlusion by the retroperitoneal node masses.  There was moderate left hydronephrosis possibly secondary to a distended urinary bladder versus compression from the left nodal mass.  She subsequently was seen and evaluated by oncologist, Dr Delton Coombes, who performed a PET scan on May 17, 2019.  This revealed the previously demonstrated retroperitoneal masses which were hypermetabolic consistent with malignancy.  There is probable associated partial obstruction of the right mid ureter.  There was no hypermetabolic activity within the neck or chest.  There was no hypermetabolic areas within the pelvis.  An IR guided lymph node biopsy was performed on Jun 03, 2019 and revealed adenocarcinoma with some antibodies.  The carcinoma had high-grade features with histology consistent with a possible serous carcinoma.  Immunostains were positive for cytokeratin 7, p53, PAX 8, and weak focal positivity for ER.  This immunostain profile was consistent with a gynecologic primary.  The patient was initially disinterested in chemotherapy and desired surgical resection, not understanding that both, and certainly chemotherapy, would be necessary for curative intent treatment. After counseling, she agreed to neoadjuvant chemotherapy after understanding that complete surgical resection for cure would not be possible given the extensive distribution of her metastatic nodal disease.  She went on to receive 6 cycles of neoadjuvant carboplatin paclitaxel chemotherapy as she was deemed to not have surgically resectable disease at initial presentation due to the bulky and  likely invasive nature of her retroperitoneal adenopathy.  She struggled somewhat with neoadjuvant chemotherapy requiring dose modifications.  After cycle 3 was  administered she underwent a repeat CT scan of the abdomen and pelvis performed on October 04, 2019.  This revealed that the retroperitoneal lymphadenopathy in the lower aortocaval space at the level of the bifurcations had decreased enhancement and a decrease in size measuring 5.2 x 2.9 cm (compared to 6 x 4.3 cm previously).  There was low-attenuation lymphadenopathy in the left parotid region with mild increase in size and index node measuring 5 x 4.4 cm which had slightly increased from 4.5 to 3.7 cm previously.  There were no new sites of lymphadenopathy identified.  Ca1 25 was drawn on October 06, 2019 which was day 1 of cycle 4.  This had normalized to 21.3 (prior to treatment in June 2021 this value was 346).  She continued chemotherapy because her nodal disease was not felt to be amenable to complete resection after 3 cycles, receiving her 6th cycle on November 17, 2019.  The patient reported that the cycle was particularly difficult to tolerate.  She had substantial fatigue and weakness following the cycle of chemotherapy.  On November 17, 2019 which was day 1 of cycle 6 her Ca1 25 was normal at 15.2.  (It had normalized in September, 2021).  PET restaging on December 06, 2019 showed improving periotic lymphadenopathy with the right para-aortic nodal conglomerate now measuring 2.4 x 4.8 cm in aggregate (it had previously been 2.6 x 5).  There was a very small region of FDG avidity within that area but mostly necrosis.  The area on the left parotic region was 4.8 x 4.2 cm and favored a benign retroperitoneal cyst versus lymphangioma as it had been completely stable throughout therapy with no FDG uptake.  Interval Hx:  On 01/03/2020 she underwent robotic assisted BSO, para-aortic node debulking. Intraoperative findings were significant for a bulky cystic necrotic nodal complex overlying the aortocavum at the sacral promontory and infra-mesenteric aorta. There was a cystic left para-aortic  structure that was filled with amber fluid and after drainage of the cyst there was no structure to resect. The entire right/central aortocaval nodal mass was resected with no gross residual disease remaining. There was a normal appearing left ovary, a right ovary that was mildly nodular and adherent to the right ovarian fossa (both removed). She had a surgical absent uterus. There was a normal appearing omentum and no gross peritoneal disease. At the completion of the surgery there was no gross visible residual tumor remaining. Surgery was uncomplicated.  Final pathology revealed a high grade serous carcinoma of the right ovary (comprising <28m focus). The left tube and ovary had no residual carcinoma. There was no carcinoma identified in the lymph node excision.  Since surgery she has done very well with no complaints.   Current Meds:  Outpatient Encounter Medications as of 01/25/2020  Medication Sig  . acetaminophen (TYLENOL) 650 MG CR tablet Take 650 mg by mouth every 8 (eight) hours as needed for pain.  .Marland KitchenamLODipine (NORVASC) 5 MG tablet Take 5 mg by mouth daily.  . Blood Glucose Monitoring Suppl (ONE TOUCH ULTRA 2) w/Device KIT Use as directed  . Camphor-Eucalyptus-Menthol (VICKS VAPORUB EX) Apply 1 application topically.  . Cholecalciferol (VITAMIN D3) 125 MCG (5000 UT) TABS Take 10,000-15,000 Units by mouth daily.  .Marland Kitchendocusate sodium (COLACE) 100 MG capsule Take 100-200 mg by mouth 2 (two) times daily as needed (constipation.).   .Marland Kitchen  eszopiclone (LUNESTA) 2 MG TABS tablet Take 1 tablet (2 mg total) by mouth at bedtime as needed for sleep. Take immediately before bedtime  . famotidine (PEPCID) 20 MG tablet Take 20 mg by mouth 2 (two) times daily as needed for heartburn or indigestion.  . feeding supplement (BOOST HIGH PROTEIN) LIQD Take 237 mLs by mouth daily.  Marland Kitchen glimepiride (AMARYL) 2 MG tablet TAKE ONE TABLET BY MOUTH DAILY (Patient taking differently: Take 2 mg by mouth daily with breakfast.)   . Lancets (ONETOUCH ULTRASOFT) lancets USE TO CHECK BLOOD SUGAR TWICE DAILY  . loperamide (IMODIUM) 2 MG capsule Take 2 mg by mouth as needed for diarrhea or loose stools.   Marland Kitchen loratadine (CLARITIN) 10 MG tablet Take 10 mg by mouth daily as needed (allergies.).   Marland Kitchen LORazepam (ATIVAN) 0.5 MG tablet Take 1 tablet (0.5 mg total) by mouth 2 (two) times daily as needed for anxiety.  Glory Rosebush ULTRA test strip USE TO CHECK BLOOD SUGARS TWO TIMES DAILY  . Lidocaine HCl 4 % CREA Apply 1 application topically 3 (three) times daily as needed (ankle pain.). (Patient not taking: Reported on 01/24/2020)  . OVER THE COUNTER MEDICATION Apply 1 application topically daily as needed (pain). Triderma otc pain cream  (Patient not taking: Reported on 01/24/2020)  . prochlorperazine (COMPAZINE) 10 MG tablet Take 10 mg by mouth every 6 (six) hours as needed for nausea or vomiting. (Patient not taking: Reported on 01/24/2020)  . senna (SENOKOT) 8.6 MG TABS tablet Take 1 tablet by mouth daily as needed for mild constipation. (Patient not taking: Reported on 01/24/2020)  . solifenacin (VESICARE) 5 MG tablet Take 1 tablet (5 mg total) by mouth daily. (Patient taking differently: Take 5 mg by mouth daily as needed (bladder pain/spasms.).)  . traMADol (ULTRAM) 50 MG tablet Take 50 mg by mouth 2 (two) times daily as needed (ankle pain.). (Patient not taking: Reported on 01/24/2020)  . vitamin B-12 (CYANOCOBALAMIN) 1000 MCG tablet Take 1,000 mcg by mouth daily.   . [DISCONTINUED] ALPRAZolam (XANAX) 0.25 MG tablet Take 1 tablet (0.25 mg total) by mouth at bedtime as needed for anxiety.  . [DISCONTINUED] CARBOPLATIN IV Inject into the vein every 21 ( twenty-one) days.  . [DISCONTINUED] enoxaparin (LOVENOX) 40 MG/0.4ML injection Inject 0.4 mLs (40 mg total) into the skin daily for 14 days. For AFTER surgery only  . [DISCONTINUED] HYDROmorphone (DILAUDID) 2 MG tablet Take 0.5-1 tablets (1-2 mg total) by mouth every 4 (four) hours as  needed for severe pain. (Patient not taking: Reported on 12/15/2019)  . [DISCONTINUED] Loperamide-Simethicone 2-125 MG TABS Take 1-2 tablets by mouth 4 (four) times daily as needed (diarrhea/loose stools).  . [DISCONTINUED] losartan-hydrochlorothiazide (HYZAAR) 100-12.5 MG tablet Take 1 tablet by mouth daily. (Patient not taking: Reported on 12/20/2019)   No facility-administered encounter medications on file as of 01/25/2020.    Allergy:  Allergies  Allergen Reactions  . Ciprofloxacin Swelling    Torn tendon  . Hydrocodone-Homatropine Other (See Comments)    Vertigo *pt strongly prefers to never take*  . Sulfa Antibiotics Hives, Itching and Swelling    Tongue swells  . Augmentin [Amoxicillin-Pot Clavulanate]     Diarrhea; can take PCN/ Amoxicillin  . Codeine Itching  . Crestor [Rosuvastatin Calcium] Other (See Comments)    Did something to memory    . Doxycycline Other (See Comments)    Severe rectal Gas.  . Gabapentin     disoriented  . Keflex [Cephalexin] Diarrhea and Nausea And Vomiting  .  Naproxen Other (See Comments)    Stomach cramps  . Statins     Muscle weakness  . Prednisone Anxiety    *pt strongly prefers to never be given prednisone*     Social Hx:   Social History   Socioeconomic History  . Marital status: Divorced    Spouse name: Not on file  . Number of children: 4  . Years of education: 66  . Highest education level: Not on file  Occupational History  . Occupation: retired Geographical information systems officer  Tobacco Use  . Smoking status: Never Smoker  . Smokeless tobacco: Never Used  Vaping Use  . Vaping Use: Never used  Substance and Sexual Activity  . Alcohol use: No    Alcohol/week: 0.0 standard drinks  . Drug use: No  . Sexual activity: Not Currently  Other Topics Concern  . Not on file  Social History Narrative  . Not on file   Social Determinants of Health   Financial Resource Strain: Low Risk   . Difficulty of Paying Living Expenses: Not  hard at all  Food Insecurity: No Food Insecurity  . Worried About Charity fundraiser in the Last Year: Never true  . Ran Out of Food in the Last Year: Never true  Transportation Needs: No Transportation Needs  . Lack of Transportation (Medical): No  . Lack of Transportation (Non-Medical): No  Physical Activity: Inactive  . Days of Exercise per Week: 0 days  . Minutes of Exercise per Session: 0 min  Stress: No Stress Concern Present  . Feeling of Stress : Not at all  Social Connections: Moderately Integrated  . Frequency of Communication with Friends and Family: More than three times a week  . Frequency of Social Gatherings with Friends and Family: More than three times a week  . Attends Religious Services: More than 4 times per year  . Active Member of Clubs or Organizations: Yes  . Attends Archivist Meetings: Never  . Marital Status: Divorced  Human resources officer Violence: Not At Risk  . Fear of Current or Ex-Partner: No  . Emotionally Abused: No  . Physically Abused: No  . Sexually Abused: No    Past Surgical Hx:  Past Surgical History:  Procedure Laterality Date  . ABDOMINAL HYSTERECTOMY     vaginal  . BLADDER SURGERY    . BREAST EXCISIONAL BIOPSY Bilateral   . BREAST SURGERY    . CHOLECYSTECTOMY    . fibroids removed     breast (both breasts)  . IR IMAGING GUIDED PORT INSERTION  07/26/2019   right  . MASS EXCISION Right 06/26/2016   Procedure: EXCISION MUCOID TUMOR RIGHT THUMB, IP RIGHT THUMB;  Surgeon: Daryll Brod, MD;  Location: [facility];  Service: Orthopedics;  Laterality: Right;  . PARTIAL HYSTERECTOMY    . ROBOTIC ASSISTED BILATERAL SALPINGO OOPHERECTOMY N/A 01/03/2020   Procedure: XI ROBOTIC ASSISTED BILATERAL SALPINGO OOPHORECTOMY, RADICAL TUMOR DEBULKING;  Surgeon: Everitt Amber, MD;  Location: [facility];  Service: Gynecology;  Laterality: N/A;  . ROBOTIC PELVIC AND PARA-AORTIC LYMPH NODE DISSECTION N/A 01/03/2020   Procedure: XI ROBOTIC  PARA-AORTIC LYMPHADENECTOMY;  Surgeon: Everitt Amber, MD;  Location: [facility];  Service: Gynecology;  Laterality: N/A;    Past Medical Hx:  Past Medical History:  Diagnosis Date  . Anemia   . Anxiety   . Arthritis    back of neck, bones spurs on neck  . Cancer ([place])    ovarian cancer  . Cataract   .  Cervical disc disease   . Diabetes mellitus   . Family history of thyroid cancer   . Heart murmur   . Hyperlipidemia   . Hypertension   . Mucoid cyst of joint    right thumb  . Neuropathy   . Port-A-Cath in place 07/21/2019  . Reflux   . Sleep apnea    wears CPAP nightly  . Vertigo     Past Gynecological History:  See HPI No LMP recorded. Patient has had a hysterectomy.  Family Hx:  Family History  Problem Relation Age of Onset  . Hypertension Other   . Diabetes Father   . Hypertension Father   . Diabetes Sister   . Hypertension Mother   . Hypertension Sister   . Stroke Sister   . Diabetes Maternal Aunt   . Thyroid cancer Daughter 78  . Colon cancer Neg Hx   . Esophageal cancer Neg Hx   . Stomach cancer Neg Hx   . Rectal cancer Neg Hx   . Breast cancer Neg Hx   . Endometrial cancer Neg Hx   . Ovarian cancer Neg Hx     Review of Systems:  Constitutional  + fatigue and weakness ENT Normal appearing ears and nares bilaterally Skin/Breast  No rash, sores, jaundice, itching, dryness Cardiovascular  No chest pain, shortness of breath, or edema  Pulmonary  No cough or wheeze.  Gastro Intestinal  No nausea, vomitting, or diarrhoea. No bright red blood per rectum, no abdominal pain, change in bowel movement, or constipation.  Genito Urinary  No frequency, urgency, dysuria, denies hematuria Musculo Skeletal  No myalgia, arthralgia, joint swelling or pain  Neurologic  No weakness, numbness, change in gait,  Psychology  No depression, anxiety, insomnia.   Vitals:  Blood pressure (!) 156/72, pulse 89, temperature 98.1 F (36.7 C), temperature source Tympanic, resp.  rate 18, weight 182 lb 6.4 oz (82.7 kg), SpO2 100 %.  Physical Exam: WD in NAD Neck  Supple NROM, without any enlargements.  Lymph Node Survey No cervical supraclavicular or inguinal adenopathy Cardiovascular  Pulse normal rate, regularity and rhythm. S1 and S2 normal.  Lungs  Clear to auscultation bilateraly, without wheezes/crackles/rhonchi. Good air movement.  Skin  No rash/lesions/breakdown  Psychiatry  Alert and oriented to person, place, and time  Abdomen  Normoactive bowel sounds, abdomen soft, non-tender and obese without evidence of hernia. Incisions well healed Back No CVA tenderness Genito Urinary  Vulva/vagina: deferred Rectal  deferred Extremities  No bilateral cyanosis, clubbing or edema.   30 minutes of direct face to face counseling time was spent with the patient. This included discussion about prognosis, therapy recommendations and postoperative side effects and are beyond the scope of routine postoperative care.   Thereasa Solo, MD  01/25/2020, 2:01 PM

## 2020-01-20 ENCOUNTER — Telehealth: Payer: Self-pay | Admitting: Internal Medicine

## 2020-01-20 ENCOUNTER — Other Ambulatory Visit: Payer: Self-pay | Admitting: Internal Medicine

## 2020-01-20 MED ORDER — LORAZEPAM 0.5 MG PO TABS: 0.5000 mg | ORAL_TABLET | Freq: Two times a day (BID) | ORAL | 2 refills | Status: DC | PRN

## 2020-01-20 NOTE — Telephone Encounter (Signed)
Ok lorazepam is done erx

## 2020-01-20 NOTE — Telephone Encounter (Signed)
Sent to Dr. John. 

## 2020-01-20 NOTE — Progress Notes (Deleted)
   I, Peterson Lombard, LAT, ATC acting as a scribe for Lynne Leader, MD.  Laura [name] is a 75 y.o. female who presents to [place] at [facility] today for R ankle pain due to tibialis posterior tear that occurred several months after antibiotic exposure, fluoroquinolone. Pt was last seen by Dr. Georgina Snell on 12/14/19 and was advised to do HEP, Voltaren gel, and consider CAM walker boot if needed. Today, pt reports  Dx imaging: 12/07/19 R ankle XR   Pertinent review of systems: ***  Relevant historical information: ***   Exam:  There were no vitals taken for this visit. General: Well Developed, well nourished, and in no acute distress.   MSK: ***    Lab and Radiology Results No results found for this or any previous visit (from the past 72 hour(s)). No results found.     Assessment and Plan: 75 y.o. female with ***   PDMP not reviewed this encounter. No orders of the defined types were placed in this encounter.  No orders of the defined types were placed in this encounter.    Discussed warning signs or symptoms. Please see discharge instructions. Patient expresses understanding.   ***

## 2020-01-20 NOTE — Telephone Encounter (Signed)
LORazepam (ATIVAN) 0.5 MG tablet patient wondering why this medication was denied, states if it was because she was given the xanax she does not take her because it made her too weak and the doctor discontinued it. Patient states it took so long to take because it was as needed and she doesn't always need it but with the cancer and all the medication and procedures she has to have she does experience anxiety some days.  Last seen-11.24.21 Next apt- n/a

## 2020-01-23 ENCOUNTER — Ambulatory Visit: Payer: Medicare Other | Admitting: Family Medicine

## 2020-01-24 ENCOUNTER — Encounter: Payer: Self-pay | Admitting: Gynecologic Oncology

## 2020-01-25 ENCOUNTER — Other Ambulatory Visit: Payer: Self-pay

## 2020-01-25 ENCOUNTER — Encounter: Payer: Self-pay | Admitting: Gynecologic Oncology

## 2020-01-25 ENCOUNTER — Inpatient Hospital Stay: Payer: Medicare Other | Attending: Gynecologic Oncology | Admitting: Gynecologic Oncology

## 2020-01-25 VITALS — BP 156/72 | HR 89 | Temp 98.1°F | Resp 18 | Wt 182.4 lb

## 2020-01-25 DIAGNOSIS — C561 Malignant neoplasm of right ovary: Secondary | ICD-10-CM | POA: Diagnosis present

## 2020-01-25 DIAGNOSIS — Z90722 Acquired absence of ovaries, bilateral: Secondary | ICD-10-CM | POA: Insufficient documentation

## 2020-01-25 DIAGNOSIS — C775 Secondary and unspecified malignant neoplasm of intrapelvic lymph nodes: Secondary | ICD-10-CM | POA: Diagnosis not present

## 2020-01-25 DIAGNOSIS — Z7189 Other specified counseling: Secondary | ICD-10-CM

## 2020-01-25 DIAGNOSIS — Z90710 Acquired absence of both cervix and uterus: Secondary | ICD-10-CM | POA: Diagnosis not present

## 2020-01-25 DIAGNOSIS — C569 Malignant neoplasm of unspecified ovary: Secondary | ICD-10-CM

## 2020-01-25 NOTE — Patient Instructions (Signed)
Dr Denman George is recommending taking a break from chemotherapy and transitioning into a period where Dr Raliegh Ip and Dr Denman George check your blood work and do an exam every 3 months. If the cancer returns, we will treat with chemotherapy at that time, however, there is no sign of cancer at this time.   Dr Raliegh Ip will see you next week and Dr Denman George will see you in 3 months.

## 2020-01-26 NOTE — Progress Notes (Signed)
   I, Peterson Lombard, LAT, ATC acting as a scribe for Lynne Leader, MD.  Laura [name] is a 75 y.o. female who presents to [place] at [facility] today for f/u R tibialis posterior tear suspected to be caused by an antibiotic she took to treat a UTI occurring in Aug 2021. Pt was last seen by Dr. Georgina Snell on 12/14/19 and was advised to do HEP, Voltaren gel, and CAM walker boot if needed. Today, pt reports ankle is feeling. Just every once in awhile she will have a slight pain shoot through it. Pt is compliant w/ HEP.  R ankle swelling: no Aggravates: eversion Rx tried: Tylenol  Dx imaging: 12/07/19 R ankle XR  Pertinent review of systems: No fevers or chills  Relevant historical information: Ovarian cancer recent debulking surgery.  Watchful waiting without chemotherapy at this point.  Feeling much better.   Exam:  BP 136/88 (BP Location: Right Arm, Patient Position: Sitting, Cuff Size: Normal)   Pulse 85   Ht 5\' 4"  (1.626 m)   Wt 184 lb 9.6 oz (83.7 kg)   SpO2 99%   BMI 31.69 kg/m  General: Well Developed, well nourished, and in no acute distress.   MSK: Right ankle minimally tender posterior tibialis area.  Normal ankle motion and strength.  Normal gait.    Assessment and Plan: 75 y.o. female with right ankle pain doing much better with conservative management.  Plan for watchful waiting and continue conservative management with home exercise program.  Consider formal physical therapy if needed.  More importantly however is her ovarian cancer.  She has done extremely well with debulking surgery and is feeling much better.   Total encounter time 20 minutes including face-to-face time with the patient and, reviewing past medical record, and charting on the date of service.   Discussion treatment plan and options going forward.  Discussed warning signs or symptoms. Please see discharge instructions. Patient expresses understanding.   The above documentation has  been reviewed and is accurate and complete Lynne Leader, M.D.

## 2020-01-27 ENCOUNTER — Ambulatory Visit (INDEPENDENT_AMBULATORY_CARE_PROVIDER_SITE_OTHER): Payer: Medicare Other | Admitting: Family Medicine

## 2020-01-27 ENCOUNTER — Other Ambulatory Visit: Payer: Self-pay

## 2020-01-27 VITALS — BP 136/88 | HR 85 | Ht 64.0 in | Wt 184.6 lb

## 2020-01-27 DIAGNOSIS — G8929 Other chronic pain: Secondary | ICD-10-CM | POA: Diagnosis not present

## 2020-01-27 DIAGNOSIS — M25571 Pain in right ankle and joints of right foot: Secondary | ICD-10-CM | POA: Diagnosis not present

## 2020-01-27 DIAGNOSIS — M66871 Spontaneous rupture of other tendons, right ankle and foot: Secondary | ICD-10-CM | POA: Diagnosis not present

## 2020-01-27 NOTE — Patient Instructions (Signed)
Thank you for coming in today.  Continue the home exercises.   Recheck with me in 6 weeks if not better.   Return sooner if needed.

## 2020-01-30 ENCOUNTER — Other Ambulatory Visit (HOSPITAL_COMMUNITY): Payer: Medicare Other

## 2020-01-30 ENCOUNTER — Ambulatory Visit (HOSPITAL_COMMUNITY): Payer: Medicare Other | Admitting: Hematology

## 2020-01-31 ENCOUNTER — Encounter (HOSPITAL_COMMUNITY): Payer: Self-pay | Admitting: General Practice

## 2020-01-31 NOTE — Progress Notes (Addendum)
[facility] CSW Progress Notes  Message from Ace Gins, [facility] front desk.  Patient would like a call - CSW called, no answer and no VM set up.  Called again.  "I just wanted someone to talk to."  "Im supposed to be happy - I am just stressed clean out of my head."  "There's no reason for me to be stressed because I have everything I need."  Reports she has had depression off/on "I have reactions to medications that caused me to be anxious."  Wants to stay in bed, does not answer the phone, does not want to talk w people who do call her.  Feels anxious/depressed.  Hs taken Ativan "once in a while", "I just need someone to talk to."  She has been feeling depressed, anxious, difficulty eating and sleeping, ruminating, fearful of recurrance of cancer. Isolates herself from others, experiences significant fatigue. Denies suicidal ideation, but does admit to thinking about death especially as it relates to cancer.   CSW and patient discussed common feeling and emotions when being diagnosed with cancer, and the importance of support, both professional and peer.  Will refer her to [facility] counseling intern for telehealth visits, will also link w either internal mentor if available or mentor through [facility].  At this point, she declines a referral to psychiatry for medications management but will discuss her depressed mood at next visit w oncologist.  CSW will call her in two weeks to follow up.  Edwyna Shell, LCSW Clinical Social Worker Phone:  [place], [place] Social Worker Phone:  [phone]

## 2020-02-02 ENCOUNTER — Telehealth: Payer: Self-pay | Admitting: General Practice

## 2020-02-02 ENCOUNTER — Telehealth: Payer: Self-pay

## 2020-02-02 NOTE — Telephone Encounter (Signed)
Reached out for interest in counseling and scheduled first session.  Gaylyn Rong Counseling Intern

## 2020-02-02 NOTE — Telephone Encounter (Signed)
[facility] CSW Progress Note  Patient referred for mentor match, she is agreeable to being linked with another women who has had ovarian cancer for support.  Match facilitated through Patient and [facility].  Edwyna Shell, LCSW Clinical Social Worker Phone:  [phone]

## 2020-02-07 NOTE — Progress Notes (Signed)
Laura [name] Patient and [place] Counseling Note  Session took place over phone and counselor mailed forms so patient had them to fill and mail back. Counselor did not have forms in possession, so counselor explained the PDS and received informed consent from the patient. Counselor then asked patient to introduce herself and share what was going on. Patient shared past traumas, current experience with cancer, life accomplishments, and how she is mentally at the moment. The patient described having no 'motivation' or 'interest' in doing anything. She identified the need to be able to relax and reach out to others. Counselor utilized active listening and reflections. Together, patient and counselor identified the fear of cancer coming back (I.e the unknown) as being the underlying cause for depression. Patient believes this insight is going to be crucial in her recovery. Patient was oriented times three and talkative in the session. Her mood was sad, proud, and even scared. Her affect was sometimes incongruent, as she would laugh when talking about painful topics. There were no signs of SI/HI/NSSI. Patient has undergone a lot of trauma in her life, but this cancer diagnosis has resulted in depression. Thoughts of "What if my cancer comes back?" brings up fear and anxiety, which results in the patient not wanting to do anything (I.e. frozen in fear). If patient can acknowledge and conquer the fear, she will be better suited to go on living her life. Next session will further explore fear of the unknown and identify ways to overcome it.   Gaylyn Rong Counseling Intern

## 2020-02-08 ENCOUNTER — Telehealth: Payer: Self-pay | Admitting: Internal Medicine

## 2020-02-08 ENCOUNTER — Encounter: Payer: Self-pay | Admitting: Internal Medicine

## 2020-02-08 NOTE — Telephone Encounter (Signed)
Please refill as per office routine med refill policy (all routine meds refilled for 3 mo or monthly per pt preference up to one year from last visit, then month to month grace period for 3 mo, then further med refills will have to be denied)  

## 2020-02-08 NOTE — Telephone Encounter (Signed)
Patient wondering why it was denied, she only has a few left

## 2020-02-09 ENCOUNTER — Inpatient Hospital Stay (HOSPITAL_BASED_OUTPATIENT_CLINIC_OR_DEPARTMENT_OTHER): Payer: Medicare Other | Admitting: Hematology

## 2020-02-09 ENCOUNTER — Inpatient Hospital Stay (HOSPITAL_COMMUNITY): Payer: Medicare Other | Attending: Hematology

## 2020-02-09 ENCOUNTER — Other Ambulatory Visit: Payer: Self-pay

## 2020-02-09 ENCOUNTER — Encounter (HOSPITAL_COMMUNITY): Payer: Self-pay | Admitting: Hematology

## 2020-02-09 ENCOUNTER — Other Ambulatory Visit: Payer: Self-pay | Admitting: Internal Medicine

## 2020-02-09 VITALS — BP 156/70 | HR 98 | Temp 97.2°F | Resp 18 | Wt 182.9 lb

## 2020-02-09 DIAGNOSIS — M545 Low back pain, unspecified: Secondary | ICD-10-CM | POA: Insufficient documentation

## 2020-02-09 DIAGNOSIS — N3289 Other specified disorders of bladder: Secondary | ICD-10-CM | POA: Insufficient documentation

## 2020-02-09 DIAGNOSIS — I10 Essential (primary) hypertension: Secondary | ICD-10-CM | POA: Diagnosis not present

## 2020-02-09 DIAGNOSIS — Z9221 Personal history of antineoplastic chemotherapy: Secondary | ICD-10-CM | POA: Insufficient documentation

## 2020-02-09 DIAGNOSIS — M7989 Other specified soft tissue disorders: Secondary | ICD-10-CM | POA: Diagnosis not present

## 2020-02-09 DIAGNOSIS — Z808 Family history of malignant neoplasm of other organs or systems: Secondary | ICD-10-CM | POA: Insufficient documentation

## 2020-02-09 DIAGNOSIS — Z8543 Personal history of malignant neoplasm of ovary: Secondary | ICD-10-CM | POA: Insufficient documentation

## 2020-02-09 DIAGNOSIS — K5900 Constipation, unspecified: Secondary | ICD-10-CM | POA: Diagnosis not present

## 2020-02-09 DIAGNOSIS — Z8249 Family history of ischemic heart disease and other diseases of the circulatory system: Secondary | ICD-10-CM | POA: Insufficient documentation

## 2020-02-09 DIAGNOSIS — Z95828 Presence of other vascular implants and grafts: Secondary | ICD-10-CM

## 2020-02-09 DIAGNOSIS — Z79899 Other long term (current) drug therapy: Secondary | ICD-10-CM | POA: Insufficient documentation

## 2020-02-09 DIAGNOSIS — C569 Malignant neoplasm of unspecified ovary: Secondary | ICD-10-CM

## 2020-02-09 DIAGNOSIS — Z833 Family history of diabetes mellitus: Secondary | ICD-10-CM | POA: Diagnosis not present

## 2020-02-09 LAB — CBC WITH DIFFERENTIAL/PLATELET
Abs Immature Granulocytes: 0.02 10*3/uL (ref 0.00–0.07)
Basophils Absolute: 0 10*3/uL (ref 0.0–0.1)
Basophils Relative: 0 %
Eosinophils Absolute: 0.1 10*3/uL (ref 0.0–0.5)
Eosinophils Relative: 1 %
HCT: 36.5 % (ref 36.0–46.0)
Hemoglobin: 12 g/dL (ref 12.0–15.0)
Immature Granulocytes: 0 %
Lymphocytes Relative: 34 %
Lymphs Abs: 2.1 10*3/uL (ref 0.7–4.0)
MCH: 30.8 pg (ref 26.0–34.0)
MCHC: 32.9 g/dL (ref 30.0–36.0)
MCV: 93.8 fL (ref 80.0–100.0)
Monocytes Absolute: 0.3 10*3/uL (ref 0.1–1.0)
Monocytes Relative: 5 %
Neutro Abs: 3.7 10*3/uL (ref 1.7–7.7)
Neutrophils Relative %: 60 %
Platelets: 192 10*3/uL (ref 150–400)
RBC: 3.89 MIL/uL (ref 3.87–5.11)
RDW: 12.4 % (ref 11.5–15.5)
WBC: 6.2 10*3/uL (ref 4.0–10.5)
nRBC: 0 % (ref 0.0–0.2)

## 2020-02-09 LAB — COMPREHENSIVE METABOLIC PANEL
ALT: 14 U/L (ref 0–44)
AST: 16 U/L (ref 15–41)
Albumin: 4.4 g/dL (ref 3.5–5.0)
Alkaline Phosphatase: 54 U/L (ref 38–126)
Anion gap: 8 (ref 5–15)
BUN: 19 mg/dL (ref 8–23)
CO2: 22 mmol/L (ref 22–32)
Calcium: 9.7 mg/dL (ref 8.9–10.3)
Chloride: 105 mmol/L (ref 98–111)
Creatinine, Ser: 1.07 mg/dL — ABNORMAL HIGH (ref 0.44–1.00)
GFR, Estimated: 54 mL/min — ABNORMAL LOW (ref >60)
Glucose, Bld: 206 mg/dL — ABNORMAL HIGH (ref 70–99)
Potassium: 4 mmol/L (ref 3.5–5.1)
Sodium: 135 mmol/L (ref 135–145)
Total Bilirubin: 0.5 mg/dL (ref 0.3–1.2)
Total Protein: 7.3 g/dL (ref 6.5–8.1)

## 2020-02-09 MED ORDER — HEPARIN SOD (PORK) LOCK FLUSH 100 UNIT/ML IV SOLN
500.0000 [IU] | Freq: Once | INTRAVENOUS | Status: AC
  Administered 2020-02-09: 500 [IU] via INTRAVENOUS

## 2020-02-09 MED ORDER — AMLODIPINE BESYLATE 5 MG PO TABS: 5.0000 mg | ORAL_TABLET | Freq: Every day | ORAL | 3 refills | Status: DC

## 2020-02-09 MED ORDER — SODIUM CHLORIDE 0.9% FLUSH
20.0000 mL | INTRAVENOUS | Status: DC | PRN
  Administered 2020-02-09: 20 mL via INTRAVENOUS

## 2020-02-09 NOTE — Patient Instructions (Signed)
[facility] at [facility] Discharge Instructions  You were seen today by Dr. Katragadda. He went over your recent results. Dr. Katragadda will see you back in 2 months for labs and follow up.   Thank you for choosing [facility] at [facility] to provide your oncology and hematology care.  To afford each patient quality time with our provider, please arrive at least 15 minutes before your scheduled appointment time.   If you have a lab appointment with the Cancer Center please come in thru the Main Entrance and check in at the main information desk  You need to re-schedule your appointment should you arrive 10 or more minutes late.  We strive to give you quality time with our providers, and arriving late affects you and other patients whose appointments are after yours.  Also, if you no show three or more times for appointments you may be dismissed from the clinic at the providers discretion.     Again, thank you for choosing [facility].  Our hope is that these requests will decrease the amount of time that you wait before being seen by our physicians.       _____________________________________________________________  Should you have questions after your visit to  Cancer Center, please contact our office at [phone] between the hours of 8:00 a.m. and 4:30 p.m.  Voicemails left after 4:00 p.m. will not be returned until the following business day.  For prescription refill requests, have your pharmacy contact our office and allow 72 hours.    Cancer Center Support Programs:   > Cancer Support Group  2nd Tuesday of the month 1pm-2pm, Journey Room   

## 2020-02-09 NOTE — Telephone Encounter (Addendum)
° °  Patient calling for status of refill. Patient has 3 pills remaining. Patient last appointment 11/24 Pharmacy [facility] at [facility] [place]

## 2020-02-09 NOTE — Progress Notes (Signed)
Laura [name] tolerated portacath lab draw well without complaints or incident. Port accessed with 20 gauge needle with blood drawn for labs ordered then flushed easily per protocol and de-accessed.Pt discharged via wheelchair in satisfactory condition

## 2020-02-09 NOTE — Progress Notes (Signed)
Laura [name], Laura [name] [id number]   CLINIC:  Medical Oncology/Hematology  PCP:  Biagio Borg, MD [employer] / [place] [address] [phone]   REASON FOR VISIT:  Follow-up for ovarian cancer  PRIOR THERAPY:  1. Carboplatin, paclitaxel and Aloxi x 6 cycles from 07/28/2019 to 11/17/2019. 2. Robotic assisted BSO with tumor debulking on 01/03/2020.  NGS Results: Foundation 1 MS--stable, TMB 0 Muts/Mb  CURRENT THERAPY: Observation  BRIEF ONCOLOGIC HISTORY:  Oncology History  Malignant neoplasm of both ovaries  06/20/2019 Initial Diagnosis   Primary ovarian adenocarcinoma, unspecified laterality ([place])   07/01/2019 Genetic Testing   Foundation One     07/11/2019 Genetic Testing   Negative genetic testing:  No pathogenic variants detected on the [facility] Multi-Cancer Panel. The report date is 07/11/2019.  The Multi-Cancer Panel offered by [facility] includes sequencing and/or deletion duplication testing of the following 85 genes: AIP, ALK, APC, ATM, AXIN2,BAP1,  BARD1, BLM, BMPR1A, BRCA1, BRCA2, BRIP1, CASR, CDC73, CDH1, CDK4, CDKN1B, CDKN1C, CDKN2A (p14ARF), CDKN2A (p16INK4a), CEBPA, CHEK2, CTNNA1, DICER1, DIS3L2, EGFR (c.2369C>T, p.Thr790Met variant only), EPCAM (Deletion/duplication testing only), FH, FLCN, GATA2, GPC3, GREM1 (Promoter region deletion/duplication testing only), HOXB13 (c.251G>A, p.Gly84Glu), HRAS, KIT, MAX, MEN1, MET, MITF (c.952G>A, p.Glu318Lys variant only), MLH1, MSH2, MSH3, MSH6, MUTYH, NBN, NF1, NF2, NTHL1, PALB2, PDGFRA, PHOX2B, PMS2, POLD1, POLE, POT1, PRKAR1A, PTCH1, PTEN, RAD50, RAD51C, RAD51D, RB1, RECQL4, RET, RNF43, RUNX1, SDHAF2, SDHA (sequence changes only), SDHB, SDHC, SDHD, SMAD4, SMARCA4, SMARCB1, SMARCE1, STK11, SUFU, TERC, TERT, TMEM127, TP53, TSC1, TSC2, VHL, WRN and WT1.   07/28/2019 -  Chemotherapy   The patient had palonosetron (ALOXI) injection 0.25 mg, 0.25 mg, Intravenous,  Once, 6 of 7  cycles Administration: 0.25 mg (07/28/2019), 0.25 mg (08/18/2019), 0.25 mg (09/15/2019), 0.25 mg (10/06/2019), 0.25 mg (10/27/2019), 0.25 mg (11/17/2019) pegfilgrastim (NEULASTA ONPRO KIT) injection 6 mg, 6 mg, Subcutaneous, Once, 6 of 7 cycles Administration: 6 mg (07/28/2019), 6 mg (08/18/2019), 6 mg (09/15/2019), 6 mg (10/06/2019), 6 mg (10/27/2019), 6 mg (11/17/2019) CARBOplatin (PARAPLATIN) 470 mg in sodium chloride 0.9 % 250 mL chemo infusion, 470 mg (100 % of original dose 470 mg), Intravenous,  Once, 6 of 7 cycles Dose modification:   (original dose 470 mg, Cycle 1, Reason: Provider Judgment),   (original dose 568.2 mg, Cycle 2),   (original dose 460.5 mg, Cycle 3), 460 mg (original dose 460 mg, Cycle 4, Reason: Provider Judgment),   (original dose 473.5 mg, Cycle 5, Reason: Provider Judgment), 460 mg (original dose 460 mg, Cycle 6) Administration: 470 mg (07/28/2019), 570 mg (08/18/2019), 460 mg (09/15/2019), 460 mg (10/06/2019), 460 mg (10/27/2019), 460 mg (11/17/2019) fosaprepitant (EMEND) 150 mg in sodium chloride 0.9 % 145 mL IVPB, 150 mg, Intravenous,  Once, 6 of 7 cycles Administration: 150 mg (07/28/2019), 150 mg (08/18/2019), 150 mg (09/15/2019), 150 mg (10/06/2019), 150 mg (10/27/2019), 150 mg (11/17/2019) PACLitaxel (TAXOL) 282 mg in sodium chloride 0.9 % 250 mL chemo infusion (> 64m/m2), 140 mg/m2 = 282 mg (80 % of original dose 175 mg/m2), Intravenous,  Once, 6 of 7 cycles Dose modification: 140 mg/m2 (80 % of original dose 175 mg/m2, Cycle 1, Reason: Provider Judgment), 150 mg/m2 (original dose 175 mg/m2, Cycle 2, Reason: Provider Judgment), 140 mg/m2 (original dose 175 mg/m2, Cycle 3, Reason: Provider Judgment), 140 mg/m2 (original dose 175 mg/m2, Cycle 4, Reason: Other (see comments), Comment: fatigue) Administration: 282 mg (07/28/2019), 300 mg (08/18/2019), 282 mg (09/15/2019), 282 mg (10/06/2019), 282 mg (10/27/2019), 282  mg (11/17/2019)  for chemotherapy treatment.      CANCER STAGING: Cancer Staging No  matching staging information was found for the patient.  INTERVAL HISTORY:  [name], a 75 y.o. female, returns for routine follow-up of her ovarian cancer. Laura [name] was last seen on 12/15/2019.   Today she reports feeling okay. She tolerated the surgery well and her scars are healing well. She notes that all she took for the pain was Tylenol. She denies having any abdominal pain. She also admits having some depression and has had a therapist arranged to see her. She reports feeling an itching sensation in the soles of her feet when she sleeps at night.  She will see Dr. Denman George in April.   REVIEW OF SYSTEMS:  Review of Systems  Constitutional: Positive for appetite change (50%) and fatigue (50%).  Gastrointestinal: Positive for constipation (occasional). Negative for abdominal pain.  Neurological: Positive for numbness (itching in soles of feet).  Psychiatric/Behavioral: Positive for depression (seeing therapist) and sleep disturbance (d/t itching of soles).  All other systems reviewed and are negative.   PAST MEDICAL/SURGICAL HISTORY:  Past Medical History:  Diagnosis Date  . Anemia   . Anxiety   . Arthritis    back of neck, bones spurs on neck  . Cancer ([place])    ovarian cancer  . Cataract   . Cervical disc disease   . Diabetes mellitus   . Family history of thyroid cancer   . Heart murmur   . Hyperlipidemia   . Hypertension   . Mucoid cyst of joint    right thumb  . Neuropathy   . Port-A-Cath in place 07/21/2019  . Reflux   . Sleep apnea    wears CPAP nightly  . Vertigo    Past Surgical History:  Procedure Laterality Date  . ABDOMINAL HYSTERECTOMY     vaginal  . BLADDER SURGERY    . BREAST EXCISIONAL BIOPSY Bilateral   . BREAST SURGERY    . CHOLECYSTECTOMY    . fibroids removed     breast (both breasts)  . IR IMAGING GUIDED PORT INSERTION  07/26/2019   right  . MASS EXCISION Right 06/26/2016   Procedure: EXCISION MUCOID TUMOR RIGHT THUMB, IP RIGHT THUMB;   Surgeon: Daryll Brod, MD;  Location: [facility];  Service: Orthopedics;  Laterality: Right;  . PARTIAL HYSTERECTOMY    . ROBOTIC ASSISTED BILATERAL SALPINGO OOPHERECTOMY N/A 01/03/2020   Procedure: XI ROBOTIC ASSISTED BILATERAL SALPINGO OOPHORECTOMY, RADICAL TUMOR DEBULKING;  Surgeon: Everitt Amber, MD;  Location: [facility];  Service: Gynecology;  Laterality: N/A;  . ROBOTIC PELVIC AND PARA-AORTIC LYMPH NODE DISSECTION N/A 01/03/2020   Procedure: XI ROBOTIC PARA-AORTIC LYMPHADENECTOMY;  Surgeon: Everitt Amber, MD;  Location: [facility];  Service: Gynecology;  Laterality: N/A;    SOCIAL HISTORY:  Social History   Socioeconomic History  . Marital status: Divorced    Spouse name: Not on file  . Number of children: 4  . Years of education: 54  . Highest education level: Not on file  Occupational History  . Occupation: retired Geographical information systems officer  Tobacco Use  . Smoking status: Never Smoker  . Smokeless tobacco: Never Used  Vaping Use  . Vaping Use: Never used  Substance and Sexual Activity  . Alcohol use: No    Alcohol/week: 0.0 standard drinks  . Drug use: No  . Sexual activity: Not Currently  Other Topics Concern  . Not on file  Social History Narrative  .  Not on file   Social Determinants of Health   Financial Resource Strain: Low Risk   . Difficulty of Paying Living Expenses: Not hard at all  Food Insecurity: No Food Insecurity  . Worried About Charity fundraiser in the Last Year: Never true  . Ran Out of Food in the Last Year: Never true  Transportation Needs: No Transportation Needs  . Lack of Transportation (Medical): No  . Lack of Transportation (Non-Medical): No  Physical Activity: Inactive  . Days of Exercise per Week: 0 days  . Minutes of Exercise per Session: 0 min  Stress: No Stress Concern Present  . Feeling of Stress : Not at all  Social Connections: Moderately Integrated  . Frequency of Communication with Friends and Family: More than three  times a week  . Frequency of Social Gatherings with Friends and Family: More than three times a week  . Attends Religious Services: More than 4 times per year  . Active Member of Clubs or Organizations: Yes  . Attends Archivist Meetings: Never  . Marital Status: Divorced  Human resources officer Violence: Not At Risk  . Fear of Current or Ex-Partner: No  . Emotionally Abused: No  . Physically Abused: No  . Sexually Abused: No    FAMILY HISTORY:  Family History  Problem Relation Age of Onset  . Hypertension Other   . Diabetes Father   . Hypertension Father   . Diabetes Sister   . Hypertension Mother   . Hypertension Sister   . Stroke Sister   . Diabetes Maternal Aunt   . Thyroid cancer Daughter 36  . Colon cancer Neg Hx   . Esophageal cancer Neg Hx   . Stomach cancer Neg Hx   . Rectal cancer Neg Hx   . Breast cancer Neg Hx   . Endometrial cancer Neg Hx   . Ovarian cancer Neg Hx     CURRENT MEDICATIONS:  Current Outpatient Medications  Medication Sig Dispense Refill  . acetaminophen (TYLENOL) 650 MG CR tablet Take 650 mg by mouth every 8 (eight) hours as needed for pain.    Marland Kitchen amLODipine (NORVASC) 5 MG tablet Take 5 mg by mouth daily.    . Blood Glucose Monitoring Suppl (ONE TOUCH ULTRA 2) w/Device KIT Use as directed 1 each 0  . Camphor-Eucalyptus-Menthol (VICKS VAPORUB EX) Apply 1 application topically.    . Cholecalciferol (VITAMIN D3) 125 MCG (5000 UT) TABS Take 10,000-15,000 Units by mouth daily.    Marland Kitchen docusate sodium (COLACE) 100 MG capsule Take 100-200 mg by mouth 2 (two) times daily as needed (constipation.).     Marland Kitchen eszopiclone (LUNESTA) 2 MG TABS tablet Take 1 tablet (2 mg total) by mouth at bedtime as needed for sleep. Take immediately before bedtime 90 tablet 1  . famotidine (PEPCID) 20 MG tablet Take 20 mg by mouth 2 (two) times daily as needed for heartburn or indigestion.    . feeding supplement (BOOST HIGH PROTEIN) LIQD Take 237 mLs by mouth daily.    Marland Kitchen  glimepiride (AMARYL) 2 MG tablet TAKE ONE TABLET BY MOUTH DAILY (Patient taking differently: Take 2 mg by mouth daily with breakfast.) 90 tablet 3  . Lancets (ONETOUCH ULTRASOFT) lancets USE TO CHECK BLOOD SUGAR TWICE DAILY 100 each 3  . Lidocaine HCl 4 % CREA Apply 1 application topically 3 (three) times daily as needed (ankle pain.).    Marland Kitchen loperamide (IMODIUM) 2 MG capsule Take 2 mg by mouth as needed for diarrhea  or loose stools.     Marland Kitchen loratadine (CLARITIN) 10 MG tablet Take 10 mg by mouth daily as needed (allergies.).     Marland Kitchen LORazepam (ATIVAN) 0.5 MG tablet Take 1 tablet (0.5 mg total) by mouth 2 (two) times daily as needed for anxiety. 60 tablet 2  . ONETOUCH ULTRA test strip USE TO CHECK BLOOD SUGARS TWO TIMES DAILY 100 strip 3  . OVER THE COUNTER MEDICATION Apply 1 application topically daily as needed (pain). Triderma otc pain cream    . prochlorperazine (COMPAZINE) 10 MG tablet Take 10 mg by mouth every 6 (six) hours as needed for nausea or vomiting.    . senna (SENOKOT) 8.6 MG TABS tablet Take 1 tablet by mouth daily as needed for mild constipation.    . solifenacin (VESICARE) 5 MG tablet Take 1 tablet (5 mg total) by mouth daily. (Patient taking differently: Take 5 mg by mouth daily as needed (bladder pain/spasms.).) 90 tablet 3  . traMADol (ULTRAM) 50 MG tablet Take 50 mg by mouth 2 (two) times daily as needed (ankle pain.).    Marland Kitchen vitamin B-12 (CYANOCOBALAMIN) 1000 MCG tablet Take 1,000 mcg by mouth daily.      No current facility-administered medications for this visit.   Facility-Administered Medications Ordered in Other Visits  Medication Dose Route Frequency Provider Last Rate Last Admin  . heparin lock flush 100 unit/mL  500 Units Intravenous Once Derek Jack, MD      . sodium chloride flush (NS) 0.9 % injection 20 mL  20 mL Intravenous PRN Derek Jack, MD        ALLERGIES:  Allergies  Allergen Reactions  . Ciprofloxacin Swelling    Torn tendon  .  Hydrocodone-Homatropine Other (See Comments)    Vertigo *pt strongly prefers to never take*  . Sulfa Antibiotics Hives, Itching and Swelling    Tongue swells  . Augmentin [Amoxicillin-Pot Clavulanate]     Diarrhea; can take PCN/ Amoxicillin  . Codeine Itching  . Crestor [Rosuvastatin Calcium] Other (See Comments)    Did something to memory    . Doxycycline Other (See Comments)    Severe rectal Gas.  . Gabapentin     disoriented  . Keflex [Cephalexin] Diarrhea and Nausea And Vomiting  . Naproxen Other (See Comments)    Stomach cramps  . Statins     Muscle weakness  . Prednisone Anxiety    *pt strongly prefers to never be given prednisone*     PHYSICAL EXAM:  Performance status (ECOG): 1 - Symptomatic but completely ambulatory  Vitals:   02/09/20 1301  BP: (!) 156/70  Pulse: 98  Resp: 18  Temp: (!) 97.2 F (36.2 C)  SpO2: 100%   Wt Readings from Last 3 Encounters:  02/09/20 182 lb 14.4 oz (83 kg)  01/27/20 184 lb 9.6 oz (83.7 kg)  01/25/20 182 lb 6.4 oz (82.7 kg)   Physical Exam Vitals reviewed.  Constitutional:      Appearance: Normal appearance. She is obese.  Cardiovascular:     Rate and Rhythm: Normal rate and regular rhythm.     Pulses: Normal pulses.     Heart sounds: Normal heart sounds.  Pulmonary:     Effort: Pulmonary effort is normal.     Breath sounds: Normal breath sounds.  Chest:     Comments: Port-a-Cath in R chest Abdominal:     Palpations: Abdomen is soft. There is no mass.     Tenderness: There is no abdominal tenderness.  Neurological:  General: No focal deficit present.     Mental Status: She is alert and oriented to person, place, and time.  Psychiatric:        Mood and Affect: Mood normal.        Behavior: Behavior normal.      LABORATORY DATA:  I have reviewed the labs as listed.  CBC Latest Ref Rng & Units 02/09/2020 01/04/2020 01/04/2020  WBC 4.0 - 10.5 K/uL 6.2 9.4 9.7  Hemoglobin 12.0 - 15.0 g/dL 12.0 9.0(L) 9.2(L)   Hematocrit 36.0 - 46.0 % 36.5 26.7(L) 27.4(L)  Platelets 150 - 400 K/uL 192 121(L) 125(L)   CMP Latest Ref Rng & Units 02/09/2020 01/04/2020 12/27/2019  Glucose 70 - 99 mg/dL 206(H) 149(H) 167(H)  BUN 8 - 23 mg/dL 19 17 19   Creatinine 0.44 - 1.00 mg/dL 1.07(H) 0.97 0.97  Sodium 135 - 145 mmol/L 135 137 138  Potassium 3.5 - 5.1 mmol/L 4.0 4.2 4.1  Chloride 98 - 111 mmol/L 105 104 106  CO2 22 - 32 mmol/L 22 24 25   Calcium 8.9 - 10.3 mg/dL 9.7 9.2 10.0  Total Protein 6.5 - 8.1 g/dL 7.3 - -  Total Bilirubin 0.3 - 1.2 mg/dL 0.5 - -  Alkaline Phos 38 - 126 U/L 54 - -  AST 15 - 41 U/L 16 - -  ALT 0 - 44 U/L 14 - -  No results found for: CA125  Surgical pathology ([id number]) on 01/03/2020: Right ovary and fallopian tube: residual adenocarcinoma, leiomyoma.  DIAGNOSTIC IMAGING:  I have independently reviewed the scans and discussed with the patient. No results found.   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 Mass measures 6 x 5.8 cm and is partially calcified. Separate solid component superior to this in the left periaortic region measures 3.5 cm, SUV 14.3. Cystic component medial to the lower pole of the left kidney is without hypermetabolic activity, possibly a lymphocele. -CT-guided biopsy of the right retroperitoneal lymph node consistent with adenocarcinoma with psammoma bodies. Morphology and immunotherapy consistent with ovarian serous carcinoma/primary peritoneal carcinoma. -Germline mutation testing was negative. -Foundation 1 testing shows MS-stable. Loss of heterozygosity was less than 16%. -She met with Dr. Denman George who recommended neoadjuvant chemotherapywith 3-6 cycles of carboplatin and paclitaxel with interim staging after 3 cycles. If there is substantial response unless encasement of the aorta and vena cava,  consideration for debulking procedure can be possible at that time. -CA-125 346 on 06/23/2019. -Cycle 1 of carboplatin and paclitaxel on 07/28/2019. -CTAP on 9/21/2021after 3 cycles of chemotherapy showed retroperitoneal adenopathy at the bifurcation measuring 5.2 x 2.9 cm, left para-aortic lymphadenopathy measuring 5 x 4.4 cm, both of them decreased in size when compared to most recent PET scan. CT scan report says that one of the lesion has gotten bigger but this was compared to CT scan from 04/02/2019. -PET scan on December 05, 2019 shows 4.8 x 4.2 cm fluid filled density in the left para-aortic region, non-FDG avid favoring benign retroperitoneal cyst versus lymphangioma.  Right/anterior para-aortic mixed cystic/solid lesions measuring 2.4 x 4.8 cm, SUV 2.5, previous SUV 19.5. -Robotic assisted laparoscopic total hysterectomy and bilateral salpingo-oophorectomy by Dr. Denman George on 01/03/2020. -Pathology showed soft tissue deformities 2 sites and psammomatous calcifications and chronic inflammation with no malignancy identified in the para-aortic lymph node.  Right salpingo-oophorectomy showed microscopic focus of residual adenocarcinoma less than 1 mm.  No malignancy in  the left ovary.  [id number].   PLAN:  1. Ovarian serous carcinoma: -She has recovered very well from her surgery.  She has very good results after neoadjuvant chemotherapy. -I have discussed pathology report with the patient.  As there was no significant viable tumor in her pathology and her prior struggles with chemotherapy, I agree with Dr. Serita Grit recommendation that no additional chemotherapy is needed for now.  Plan-labs from today shows normal chemistries and CBC. -I will see her back in 2 months with a repeat tumor marker and other labs.  2. Low back pain: -She is not requiring any pain medications at this time.  3. Constipation: -Continue Senokot 1 tablet daily.  4. Hypertension: -Continue amlodipine daily.  5.  Leg swelling: -Continue Lasix as needed.  6. Difficulty sleeping: -Continue lorazepam as needed.  7. Bladder spasms: -Continue Vesicare as needed.   Orders placed this encounter:  No orders of the defined types were placed in this encounter.    Derek Jack, MD [name] [phone]   I, Milinda Antis, am acting as a scribe for Dr. Sanda Linger.  I, Derek Jack MD, have reviewed the above documentation for accuracy and completeness, and I agree with the above.

## 2020-02-09 NOTE — Addendum Note (Signed)
Addended by: Shirlyn Goltz on: 02/09/2020 04:25 PM   Modules accepted: Orders

## 2020-02-09 NOTE — Patient Instructions (Signed)
[facility] at [facility] Discharge Instructions  Labs drawn from portacath today   Thank you for choosing [facility] at [facility] to provide your oncology and hematology care.  To afford each patient quality time with our provider, please arrive at least 15 minutes before your scheduled appointment time.   If you have a lab appointment with the Cancer Center please come in thru the Main Entrance and check in at the main information desk.  You need to re-schedule your appointment should you arrive 10 or more minutes late.  We strive to give you quality time with our providers, and arriving late affects you and other patients whose appointments are after yours.  Also, if you no show three or more times for appointments you may be dismissed from the clinic at the providers discretion.     Again, thank you for choosing [facility].  Our hope is that these requests will decrease the amount of time that you wait before being seen by our physicians.       _____________________________________________________________  Should you have questions after your visit to [facility], please contact our office at [phone] and follow the prompts.  Our office hours are 8:00 a.m. and 4:30 p.m. Monday - Friday.  Please note that voicemails left after 4:00 p.m. may not be returned until the following business day.  We are closed weekends and major holidays.  You do have access to a nurse 24-7, just call the main number to the clinic [phone] and do not press any options, hold on the line and a nurse will answer the phone.    For prescription refill requests, have your pharmacy contact our office and allow 72 hours.    Due to Covid, you will need to wear a mask upon entering the hospital. If you do not have a mask, a mask will be given to you at the Main Entrance upon arrival. For doctor visits, patients may have 1 support person age 18  or older with them. For treatment visits, patients can not have anyone with them due to social distancing guidelines and our immunocompromised population.     

## 2020-02-10 LAB — CA 125: Cancer Antigen (CA) 125: 17.8 U/mL (ref 0.0–38.1)

## 2020-02-14 ENCOUNTER — Other Ambulatory Visit (HOSPITAL_COMMUNITY): Payer: Medicare Other | Admitting: General Practice

## 2020-02-14 NOTE — Progress Notes (Signed)
[place] Patient and [facility] Counseling Note  Patient updated the counselor on the relationship with her mentor and the reduction in stress she has experienced since talking with the mentor and counselor. The patient described a lot of worry about a lot of different things in the past months. Patient strongly expressed that she does not want to take anti-depressants. Counselor used open-ended questions to identify what "handling" her emotions looked like for the patient. Patient utilized a Product/process development scientist to describe her depression and identified what is important to her now. Patient was oriented times three, in a thoughtful mood, had a congruent affect and showed no SI/HI/NSSI. Patient is doing well medically, but often feels depressed. Thoughts of "What if my cancer comes back?" or "What if I push my loved ones away?" bring up fear and anxiety, which result in the patient not wanting to do anything (I.e. frozen in fear). Next session will check in on patient's progress and potentially introduce emotional regulation.    Gaylyn Rong Counseling Intern

## 2020-02-22 NOTE — Progress Notes (Signed)
[place] Patient and [facility] Counseling Note  Patient reported that she did not feel like talking much, so session lasted about 20 minutes. Patient shared a lot of feelings and uncertainties. Counselor provided active listening and reflections, as well as validation of feelings. Counselor provided psychoeducation around disassociation when patient described it. Patient was oriented times three and seemed to be in a depressed mood during the brief session. Her affect was flat and there were no signs of SI/HI/NSSI. Patient is doing well medically, but often feels depressed. Thoughts of "What if my cancer comes back?" or "What if I push my loved ones away?" bring up fear and anxiety, which result in the patient not wanting to do anything (I.e. frozen in fear). Next session will discuss what to do when the patient feels like this.   Gaylyn Rong Counseling Intern

## 2020-02-23 ENCOUNTER — Other Ambulatory Visit (HOSPITAL_COMMUNITY): Payer: Medicare Other

## 2020-02-23 ENCOUNTER — Ambulatory Visit (HOSPITAL_COMMUNITY): Payer: Medicare Other | Admitting: Hematology

## 2020-02-27 ENCOUNTER — Encounter: Payer: Self-pay | Admitting: Internal Medicine

## 2020-02-27 DIAGNOSIS — G720 Drug-induced myopathy: Secondary | ICD-10-CM | POA: Insufficient documentation

## 2020-02-27 DIAGNOSIS — T466X5A Adverse effect of antihyperlipidemic and antiarteriosclerotic drugs, initial encounter: Secondary | ICD-10-CM | POA: Insufficient documentation

## 2020-02-28 ENCOUNTER — Encounter: Payer: Self-pay | Admitting: Internal Medicine

## 2020-02-28 ENCOUNTER — Other Ambulatory Visit: Payer: Self-pay | Admitting: Internal Medicine

## 2020-02-28 ENCOUNTER — Other Ambulatory Visit: Payer: Self-pay

## 2020-02-28 ENCOUNTER — Ambulatory Visit (INDEPENDENT_AMBULATORY_CARE_PROVIDER_SITE_OTHER): Payer: Medicare Other | Admitting: Internal Medicine

## 2020-02-28 VITALS — BP 146/74 | HR 96 | Temp 98.5°F | Ht 64.0 in | Wt 185.0 lb

## 2020-02-28 DIAGNOSIS — Z0000 Encounter for general adult medical examination without abnormal findings: Secondary | ICD-10-CM

## 2020-02-28 DIAGNOSIS — T466X5A Adverse effect of antihyperlipidemic and antiarteriosclerotic drugs, initial encounter: Secondary | ICD-10-CM | POA: Diagnosis not present

## 2020-02-28 DIAGNOSIS — F418 Other specified anxiety disorders: Secondary | ICD-10-CM | POA: Diagnosis not present

## 2020-02-28 DIAGNOSIS — E785 Hyperlipidemia, unspecified: Secondary | ICD-10-CM | POA: Diagnosis not present

## 2020-02-28 DIAGNOSIS — G720 Drug-induced myopathy: Secondary | ICD-10-CM | POA: Diagnosis not present

## 2020-02-28 DIAGNOSIS — E119 Type 2 diabetes mellitus without complications: Secondary | ICD-10-CM | POA: Diagnosis not present

## 2020-02-28 DIAGNOSIS — E559 Vitamin D deficiency, unspecified: Secondary | ICD-10-CM | POA: Diagnosis not present

## 2020-02-28 DIAGNOSIS — F5101 Primary insomnia: Secondary | ICD-10-CM | POA: Diagnosis not present

## 2020-02-28 DIAGNOSIS — Z0001 Encounter for general adult medical examination with abnormal findings: Secondary | ICD-10-CM

## 2020-02-28 DIAGNOSIS — I10 Essential (primary) hypertension: Secondary | ICD-10-CM | POA: Diagnosis not present

## 2020-02-28 LAB — BASIC METABOLIC PANEL
BUN: 18 mg/dL (ref 6–23)
CO2: 28 mEq/L (ref 19–32)
Calcium: 10.7 mg/dL — ABNORMAL HIGH (ref 8.4–10.5)
Chloride: 106 mEq/L (ref 96–112)
Creatinine, Ser: 0.9 mg/dL (ref 0.40–1.20)
GFR: 62.64 mL/min (ref >60.00)
Glucose, Bld: 86 mg/dL (ref 70–99)
Potassium: 4.2 mEq/L (ref 3.5–5.1)
Sodium: 141 mEq/L (ref 135–145)

## 2020-02-28 LAB — URINALYSIS, ROUTINE W REFLEX MICROSCOPIC
Bilirubin Urine: NEGATIVE
Hgb urine dipstick: NEGATIVE
Ketones, ur: NEGATIVE
Leukocytes,Ua: NEGATIVE
Nitrite: NEGATIVE
RBC / HPF: NONE SEEN (ref 0–?)
Specific Gravity, Urine: <=1.005 — AB (ref 1.000–1.030)
Total Protein, Urine: NEGATIVE
Urine Glucose: 100 — AB
Urobilinogen, UA: 0.2 (ref 0.0–1.0)
WBC, UA: NONE SEEN (ref 0–?)
pH: 6 (ref 5.0–8.0)

## 2020-02-28 LAB — HEPATIC FUNCTION PANEL
ALT: 11 U/L (ref 0–35)
AST: 15 U/L (ref 0–37)
Albumin: 4.6 g/dL (ref 3.5–5.2)
Alkaline Phosphatase: 58 U/L (ref 39–117)
Bilirubin, Direct: 0 mg/dL (ref 0.0–0.3)
Total Bilirubin: 0.3 mg/dL (ref 0.2–1.2)
Total Protein: 7.8 g/dL (ref 6.0–8.3)

## 2020-02-28 LAB — CBC WITH DIFFERENTIAL/PLATELET
Basophils Absolute: 0 10*3/uL (ref 0.0–0.1)
Basophils Relative: 0.3 % (ref 0.0–3.0)
Eosinophils Absolute: 0.1 10*3/uL (ref 0.0–0.7)
Eosinophils Relative: 1 % (ref 0.0–5.0)
HCT: 36.3 % (ref 36.0–46.0)
Hemoglobin: 12.4 g/dL (ref 12.0–15.0)
Lymphocytes Relative: 29.6 % (ref 12.0–46.0)
Lymphs Abs: 2.3 10*3/uL (ref 0.7–4.0)
MCHC: 34.1 g/dL (ref 30.0–36.0)
MCV: 90.6 fl (ref 78.0–100.0)
Monocytes Absolute: 0.5 10*3/uL (ref 0.1–1.0)
Monocytes Relative: 6.3 % (ref 3.0–12.0)
Neutro Abs: 4.9 10*3/uL (ref 1.4–7.7)
Neutrophils Relative %: 62.8 % (ref 43.0–77.0)
Platelets: 196 10*3/uL (ref 150.0–400.0)
RBC: 4 Mil/uL (ref 3.87–5.11)
RDW: 13.7 % (ref 11.5–15.5)
WBC: 7.8 10*3/uL (ref 4.0–10.5)

## 2020-02-28 LAB — HEMOGLOBIN A1C: Hgb A1c MFr Bld: 6 % (ref 4.6–6.5)

## 2020-02-28 LAB — LIPID PANEL
Cholesterol: 270 mg/dL — ABNORMAL HIGH (ref 0–200)
HDL: 63.2 mg/dL (ref >39.00)
NonHDL: 207.24
Total CHOL/HDL Ratio: 4
Triglycerides: 286 mg/dL — ABNORMAL HIGH (ref 0.0–149.0)
VLDL: 57.2 mg/dL — ABNORMAL HIGH (ref 0.0–40.0)

## 2020-02-28 LAB — LDL CHOLESTEROL, DIRECT: Direct LDL: 172 mg/dL

## 2020-02-28 LAB — TSH: TSH: 2.92 u[IU]/mL (ref 0.35–4.50)

## 2020-02-28 LAB — VITAMIN D 25 HYDROXY (VIT D DEFICIENCY, FRACTURES): VITD: 51.43 ng/mL (ref 30.00–100.00)

## 2020-02-28 MED ORDER — IRBESARTAN 150 MG PO TABS: 150.0000 mg | ORAL_TABLET | Freq: Every day | ORAL | 3 refills | Status: DC

## 2020-02-28 MED ORDER — MIRTAZAPINE 15 MG PO TABS: 15.0000 mg | ORAL_TABLET | Freq: Every day | ORAL | 1 refills | Status: DC

## 2020-02-28 NOTE — Patient Instructions (Signed)
Please take all new medication as prescribed - the remeron for sleep and low mood, as well as the generic for avapro 150 mg per day  Please continue all other medications as before, including to continue the amlodipine, and the ativan  Ok to stop the [place]  Please have the pharmacy call with any other refills you may need.  Please continue your efforts at being more active, low cholesterol diet, and weight control.  You are otherwise up to date with prevention measures today.  Please keep your appointments with your specialists as you may have planned  Please go to the LAB at the blood drawing area for the tests to be done  You will be contacted by phone if any changes need to be made immediately.  Otherwise, you will receive a letter about your results with an explanation, but please check with MyChart first.  Please remember to sign up for MyChart if you have not done so, as this will be important to you in the future with finding out test results, communicating by private email, and scheduling acute appointments online when needed.  Please make an Appointment to return in 6 months, or sooner if needed

## 2020-02-28 NOTE — Progress Notes (Signed)
Patient ID: Laura [name], female   DOB: July 04, 1945, 75 y.o.   MRN: [id number]         Chief Complaint:: wellness exam and Medication Problem  , depression, insomnia, anxiety, htn       HPI:  Laura [name] is a 75 y.o. female here for wellness exam, currently up to date with preventive referrals and immunizations except due for covid booster soon, not sure if she wants to do it.     Wt Readings from Last 3 Encounters:  02/28/20 185 lb (83.9 kg)  02/09/20 182 lb 14.4 oz (83 kg)  01/27/20 184 lb 9.6 oz (83.7 kg)   BP Readings from Last 3 Encounters:  02/28/20 (!) 146/74  02/09/20 (!) 156/70  01/27/20 136/88   Immunization History  Administered Date(s) Administered  . Fluad Quad(high Dose 65+) 11/22/2018, 10/11/2019  . Influenza Split 01/06/2011, 10/22/2011  . Influenza, High Dose Seasonal PF 12/07/2012, 11/17/2013, 11/17/2013, 12/12/2015, 12/01/2016, 11/19/2017  . Pneumococcal Conjugate-13 02/15/2013  . Pneumococcal Polysaccharide-23 01/19/2006, 01/17/2008, 02/23/2017  . Td 01/15/2004  . Tdap 06/29/2015   There are no preventive care reminders to display for this patient.       Also co worsening insomnia where lunesta seemed to cause worsening bad dreams many nights over the past 3 mo, seemed better on nights she did not take it.  BP has been increased recently with sbp > 140.  Has also had worsening depressive symptoms with low mood, but no SI or HI,  Also has worsening anxiety and near panic at times in the last month for unclear reason.  Has has several worsening stressors recently, quite fearful today in addition.  Pt denies chest pain, increased sob or doe, wheezing, orthopnea, PND, increased LE swelling, palpitations, dizziness or syncope.  Denies focal neuro s/s.   Pt denies fever, wt loss, night sweats, loss of appetite, or other constitutional symptoms  Past Medical History:  Diagnosis Date  . Anemia   . Anxiety   . Arthritis    back of neck, bones spurs on neck  . Cancer  ([place])    ovarian cancer  . Cataract   . Cervical disc disease   . Diabetes mellitus   . Family history of thyroid cancer   . Heart murmur   . Hyperlipidemia   . Hypertension   . Mucoid cyst of joint    right thumb  . Neuropathy   . Port-A-Cath in place 07/21/2019  . Reflux   . Sleep apnea    wears CPAP nightly  . Vertigo    Past Surgical History:  Procedure Laterality Date  . ABDOMINAL HYSTERECTOMY     vaginal  . BLADDER SURGERY    . BREAST EXCISIONAL BIOPSY Bilateral   . BREAST SURGERY    . CHOLECYSTECTOMY    . fibroids removed     breast (both breasts)  . IR IMAGING GUIDED PORT INSERTION  07/26/2019   right  . MASS EXCISION Right 06/26/2016   Procedure: EXCISION MUCOID TUMOR RIGHT THUMB, IP RIGHT THUMB;  Surgeon: Daryll Brod, MD;  Location: [facility];  Service: Orthopedics;  Laterality: Right;  . PARTIAL HYSTERECTOMY    . ROBOTIC ASSISTED BILATERAL SALPINGO OOPHERECTOMY N/A 01/03/2020   Procedure: XI ROBOTIC ASSISTED BILATERAL SALPINGO OOPHORECTOMY, RADICAL TUMOR DEBULKING;  Surgeon: Everitt Amber, MD;  Location: [facility];  Service: Gynecology;  Laterality: N/A;  . ROBOTIC PELVIC AND PARA-AORTIC LYMPH NODE DISSECTION N/A 01/03/2020   Procedure: XI ROBOTIC PARA-AORTIC LYMPHADENECTOMY;  Surgeon: Everitt Amber, MD;  Location: [facility];  Service: Gynecology;  Laterality: N/A;    reports that she has never smoked. She has never used smokeless tobacco. She reports that she does not drink alcohol and does not use drugs. family history includes Diabetes in her father, maternal aunt, and sister; Hypertension in her father, mother, sister, and another family member; Stroke in her sister; Thyroid cancer (age of onset: 45) in her daughter. Allergies  Allergen Reactions  . Ciprofloxacin Swelling    Torn tendon  . Hydrocodone-Homatropine Other (See Comments)    Vertigo *pt strongly prefers to never take*  . Sulfa Antibiotics Hives, Itching and Swelling    Tongue swells  .  Augmentin [Amoxicillin-Pot Clavulanate]     Diarrhea; can take PCN/ Amoxicillin  . Codeine Itching  . Crestor [Rosuvastatin Calcium] Other (See Comments)    Did something to memory    . Doxycycline Other (See Comments)    Severe rectal Gas.  . Gabapentin     disoriented  . Keflex [Cephalexin] Diarrhea and Nausea And Vomiting  . Naproxen Other (See Comments)    Stomach cramps  . Statins     Muscle weakness  . Prednisone Anxiety    *pt strongly prefers to never be given prednisone*    Current Outpatient Medications on File Prior to Visit  Medication Sig Dispense Refill  . acetaminophen (TYLENOL) 650 MG CR tablet Take 650 mg by mouth every 8 (eight) hours as needed for pain.    Marland Kitchen amLODipine (NORVASC) 5 MG tablet Take 1 tablet (5 mg total) by mouth daily. 90 tablet 3  . Blood Glucose Monitoring Suppl (ONE TOUCH ULTRA 2) w/Device KIT Use as directed 1 each 0  . Camphor-Eucalyptus-Menthol (VICKS VAPORUB EX) Apply 1 application topically.    . Cholecalciferol (VITAMIN D3) 125 MCG (5000 UT) TABS Take 10,000-15,000 Units by mouth daily.    Marland Kitchen docusate sodium (COLACE) 100 MG capsule Take 100-200 mg by mouth 2 (two) times daily as needed (constipation.).     Marland Kitchen famotidine (PEPCID) 20 MG tablet Take 20 mg by mouth 2 (two) times daily as needed for heartburn or indigestion.    . feeding supplement (BOOST HIGH PROTEIN) LIQD Take 237 mLs by mouth daily.    Marland Kitchen glimepiride (AMARYL) 2 MG tablet TAKE ONE TABLET BY MOUTH DAILY (Patient taking differently: Take 2 mg by mouth daily with breakfast.) 90 tablet 3  . Lancets (ONETOUCH ULTRASOFT) lancets USE TO CHECK BLOOD SUGAR TWICE DAILY 100 each 3  . Lidocaine HCl 4 % CREA Apply 1 application topically 3 (three) times daily as needed (ankle pain.).    Marland Kitchen loperamide (IMODIUM) 2 MG capsule Take 2 mg by mouth as needed for diarrhea or loose stools.     Marland Kitchen loratadine (CLARITIN) 10 MG tablet Take 10 mg by mouth daily as needed (allergies.).     Marland Kitchen LORazepam (ATIVAN)  0.5 MG tablet Take 1 tablet (0.5 mg total) by mouth 2 (two) times daily as needed for anxiety. 60 tablet 2  . ONETOUCH ULTRA test strip USE TO CHECK BLOOD SUGARS TWO TIMES DAILY 100 strip 3  . OVER THE COUNTER MEDICATION Apply 1 application topically daily as needed (pain). Triderma otc pain cream    . prochlorperazine (COMPAZINE) 10 MG tablet Take 10 mg by mouth every 6 (six) hours as needed for nausea or vomiting.    . senna (SENOKOT) 8.6 MG TABS tablet Take 1 tablet by mouth daily as needed for mild constipation.    Marland Kitchen  solifenacin (VESICARE) 5 MG tablet Take 1 tablet (5 mg total) by mouth daily. (Patient taking differently: Take 5 mg by mouth daily as needed (bladder pain/spasms.).) 90 tablet 3  . traMADol (ULTRAM) 50 MG tablet Take 50 mg by mouth 2 (two) times daily as needed (ankle pain.).    Marland Kitchen vitamin B-12 (CYANOCOBALAMIN) 1000 MCG tablet Take 1,000 mcg by mouth daily.      No current facility-administered medications on file prior to visit.        ROS:  All others reviewed and negative.  Objective        PE:  BP (!) 146/74   Pulse 96   Temp 98.5 F (36.9 C) (Oral)   Ht _0  (1.626 m)   Wt 185 lb (83.9 kg)   SpO2 96%   BMI 31.76 kg/m                 Constitutional: Pt appears in NAD               HENT: Head: NCAT.                Right Ear: External ear normal.                 Left Ear: External ear normal.                Eyes: . Pupils are equal, round, and reactive to light. Conjunctivae and EOM are normal               Nose: without d/c or deformity               Neck: Neck supple. Gross normal ROM               Cardiovascular: Normal rate and regular rhythm.                 Pulmonary/Chest: Effort normal and breath sounds without rales or wheezing.                Abd:  Soft, NT, ND, + BS, no organomegaly               Neurological: Pt is alert. At baseline orientation, motor grossly intact               Skin: Skin is warm. No rashes, no other new lesions, LE edema -none                Psychiatric: Pt behavior is normal without agitation , + anxiuos depressed, teary at times  Micro: none  Cardiac tracings I have personally interpreted today:  none  Pertinent Radiological findings (summarize): none   Lab Results  Component Value Date   WBC 7.8 02/28/2020   HGB 12.4 02/28/2020   HCT 36.3 02/28/2020   PLT 196.0 02/28/2020   GLUCOSE 86 02/28/2020   CHOL 270 (H) 02/28/2020   TRIG 286.0 (H) 02/28/2020   HDL 63.20 02/28/2020   LDLDIRECT 172.0 02/28/2020   LDLCALC 166 (H) 04/12/2014   ALT 11 02/28/2020   AST 15 02/28/2020   NA 141 02/28/2020   K 4.2 02/28/2020   CL 106 02/28/2020   CREATININE 0.90 02/28/2020   BUN 18 02/28/2020   CO2 28 02/28/2020   TSH 2.92 02/28/2020   INR 1.0 06/06/2019   HGBA1C 6.0 02/28/2020   MICROALBUR 1.4 02/28/2020   Assessment/Plan:  LYNDIA BURY is a 75 y.o. Black or African American [2] female with  has a past medical history of Anemia, Anxiety, Arthritis, Cancer ([place]), Cataract, Cervical disc disease, Diabetes mellitus, Family history of thyroid cancer, Heart murmur, Hyperlipidemia, Hypertension, Mucoid cyst of joint, Neuropathy, Port-A-Cath in place (07/21/2019), Reflux, Sleep apnea, and Vertigo.  Encounter for well adult exam with abnormal findings Age and sex appropriate education and counseling updated with regular exercise and diet Referrals for preventative services - none needed Immunizations addressed - none needed except due for covid booster soon Smoking counseling  - none needed Evidence for depression or other mood disorder - none significant Most recent labs reviewed. I have personally reviewed and have noted: 1) the patient's medical and social history 2) The patient's current medications and supplements 3) The patient's height, weight, and BMI have been recorded in the chart   Statin myopathy Unable to tolerate statin,  to f/u any worsening symptoms or concerns  Diabetes mellitus type 2, noninsulin  dependent ([place]) Lab Results  Component Value Date   HGBA1C 6.0 02/28/2020   Stable, pt to continue current medical treatment amaryl  Current Outpatient Medications (Endocrine & Metabolic):  .  glimepiride (AMARYL) 2 MG tablet, TAKE ONE TABLET BY MOUTH DAILY (Patient taking differently: Take 2 mg by mouth daily with breakfast.)  Current Outpatient Medications (Cardiovascular):  .  amLODipine (NORVASC) 5 MG tablet, Take 1 tablet (5 mg total) by mouth daily. .  irbesartan (AVAPRO) 150 MG tablet, Take 1 tablet (150 mg total) by mouth daily.  Current Outpatient Medications (Respiratory):  Marland Kitchen  Camphor-Eucalyptus-Menthol (VICKS VAPORUB EX), Apply 1 application topically. Marland Kitchen  loratadine (CLARITIN) 10 MG tablet, Take 10 mg by mouth daily as needed (allergies.).   Current Outpatient Medications (Analgesics):  .  acetaminophen (TYLENOL) 650 MG CR tablet, Take 650 mg by mouth every 8 (eight) hours as needed for pain. .  traMADol (ULTRAM) 50 MG tablet, Take 50 mg by mouth 2 (two) times daily as needed (ankle pain.).  Current Outpatient Medications (Hematological):  .  vitamin B-12 (CYANOCOBALAMIN) 1000 MCG tablet, Take 1,000 mcg by mouth daily.   Current Outpatient Medications (Other):  .  Blood Glucose Monitoring Suppl (ONE TOUCH ULTRA 2) w/Device KIT, Use as directed .  Cholecalciferol (VITAMIN D3) 125 MCG (5000 UT) TABS, Take 10,000-15,000 Units by mouth daily. Marland Kitchen  docusate sodium (COLACE) 100 MG capsule, Take 100-200 mg by mouth 2 (two) times daily as needed (constipation.).  Marland Kitchen  famotidine (PEPCID) 20 MG tablet, Take 20 mg by mouth 2 (two) times daily as needed for heartburn or indigestion. .  feeding supplement (BOOST HIGH PROTEIN) LIQD, Take 237 mLs by mouth daily. .  Lancets (ONETOUCH ULTRASOFT) lancets, USE TO CHECK BLOOD SUGAR TWICE DAILY .  Lidocaine HCl 4 % CREA, Apply 1 application topically 3 (three) times daily as needed (ankle pain.). Marland Kitchen  loperamide (IMODIUM) 2 MG capsule, Take 2 mg by  mouth as needed for diarrhea or loose stools.  Marland Kitchen  LORazepam (ATIVAN) 0.5 MG tablet, Take 1 tablet (0.5 mg total) by mouth 2 (two) times daily as needed for anxiety. .  mirtazapine (REMERON) 15 MG tablet, Take 1 tablet (15 mg total) by mouth at bedtime. Glory Rosebush ULTRA test strip, USE TO CHECK BLOOD SUGARS TWO TIMES DAILY .  OVER THE COUNTER MEDICATION, Apply 1 application topically daily as needed (pain). Triderma otc pain cream .  prochlorperazine (COMPAZINE) 10 MG tablet, Take 10 mg by mouth every 6 (six) hours as needed for nausea or vomiting. .  senna (SENOKOT) 8.6 MG TABS tablet,  Take 1 tablet by mouth daily as needed for mild constipation. .  solifenacin (VESICARE) 5 MG tablet, Take 1 tablet (5 mg total) by mouth daily. (Patient taking differently: Take 5 mg by mouth daily as needed (bladder pain/spasms.).)   Hyperlipidemia Lab Results  Component Value Date   LDLCALC 166 (H) 04/12/2014   Stable, pt to continue current low chol diet  Hypertension BP Readings from Last 3 Encounters:  02/28/20 (!) 146/74  02/09/20 (!) 156/70  01/27/20 136/88   Stable, pt to continue medical treatment amlodipine, but also add avapro 150 qd  Current Outpatient Medications (Endocrine & Metabolic):  .  glimepiride (AMARYL) 2 MG tablet, TAKE ONE TABLET BY MOUTH DAILY (Patient taking differently: Take 2 mg by mouth daily with breakfast.)  Current Outpatient Medications (Cardiovascular):  .  amLODipine (NORVASC) 5 MG tablet, Take 1 tablet (5 mg total) by mouth daily. .  irbesartan (AVAPRO) 150 MG tablet, Take 1 tablet (150 mg total) by mouth daily.  Current Outpatient Medications (Respiratory):  Marland Kitchen  Camphor-Eucalyptus-Menthol (VICKS VAPORUB EX), Apply 1 application topically. Marland Kitchen  loratadine (CLARITIN) 10 MG tablet, Take 10 mg by mouth daily as needed (allergies.).   Current Outpatient Medications (Analgesics):  .  acetaminophen (TYLENOL) 650 MG CR tablet, Take 650 mg by mouth every 8 (eight) hours as  needed for pain. .  traMADol (ULTRAM) 50 MG tablet, Take 50 mg by mouth 2 (two) times daily as needed (ankle pain.).  Current Outpatient Medications (Hematological):  .  vitamin B-12 (CYANOCOBALAMIN) 1000 MCG tablet, Take 1,000 mcg by mouth daily.   Current Outpatient Medications (Other):  .  Blood Glucose Monitoring Suppl (ONE TOUCH ULTRA 2) w/Device KIT, Use as directed .  Cholecalciferol (VITAMIN D3) 125 MCG (5000 UT) TABS, Take 10,000-15,000 Units by mouth daily. Marland Kitchen  docusate sodium (COLACE) 100 MG capsule, Take 100-200 mg by mouth 2 (two) times daily as needed (constipation.).  Marland Kitchen  famotidine (PEPCID) 20 MG tablet, Take 20 mg by mouth 2 (two) times daily as needed for heartburn or indigestion. .  feeding supplement (BOOST HIGH PROTEIN) LIQD, Take 237 mLs by mouth daily. .  Lancets (ONETOUCH ULTRASOFT) lancets, USE TO CHECK BLOOD SUGAR TWICE DAILY .  Lidocaine HCl 4 % CREA, Apply 1 application topically 3 (three) times daily as needed (ankle pain.). Marland Kitchen  loperamide (IMODIUM) 2 MG capsule, Take 2 mg by mouth as needed for diarrhea or loose stools.  Marland Kitchen  LORazepam (ATIVAN) 0.5 MG tablet, Take 1 tablet (0.5 mg total) by mouth 2 (two) times daily as needed for anxiety. .  mirtazapine (REMERON) 15 MG tablet, Take 1 tablet (15 mg total) by mouth at bedtime. Glory Rosebush ULTRA test strip, USE TO CHECK BLOOD SUGARS TWO TIMES DAILY .  OVER THE COUNTER MEDICATION, Apply 1 application topically daily as needed (pain). Triderma otc pain cream .  prochlorperazine (COMPAZINE) 10 MG tablet, Take 10 mg by mouth every 6 (six) hours as needed for nausea or vomiting. .  senna (SENOKOT) 8.6 MG TABS tablet, Take 1 tablet by mouth daily as needed for mild constipation. .  solifenacin (VESICARE) 5 MG tablet, Take 1 tablet (5 mg total) by mouth daily. (Patient taking differently: Take 5 mg by mouth daily as needed (bladder pain/spasms.).)   Vitamin D deficiency Last vitamin D Lab Results  Component Value Date    VD25OH 51.43 02/28/2020   Stable, cont oral replacement  Depression with anxiety To start remeron 15 qhs, declines psychiatry referral, also ativan prn  Insomnia For remeron 15 qhs  Followup: Return in about 6 months (around 08/27/2020).  Cathlean Cower, MD 03/04/2020 9:25 PM [facility]

## 2020-02-28 NOTE — Progress Notes (Signed)
[place] Patient and [facility] Counseling Note  Patient reported that she had been doing "decent" and that she had to leave for a doctor's appointment during the session. Patient and counselor discussed the dreams patient has had, the coping skills sheet that counselor provided, and communicating with friends. Patient wants but feels like she can't "break free" from depression. Patient wants answers on why she is depressed and wants to keep working on her coping skills. Counselor provided validation of feelings, active listening, and a space to process thoughts and emotions. Patient was oriented times three, in a sad mood, and had a congruent affect. There were no signs of SI/HI/NSSI. Marland Kitchen Patient is doing well medically, but often feels depressed.Thoughts of "What if my cancer comes back?" or "What if I push my loved ones away?"bring up fear and anxiety, which result in the patient not wanting to do anything (I.e. frozen in fear). Next session will continue to work on processing and coping skills.   Gaylyn Rong Counseling Intern

## 2020-02-29 ENCOUNTER — Encounter: Payer: Self-pay | Admitting: Internal Medicine

## 2020-02-29 LAB — MICROALBUMIN / CREATININE URINE RATIO
Creatinine,U: 41 mg/dL
Microalb Creat Ratio: 3.3 mg/g (ref 0.0–30.0)
Microalb, Ur: 1.4 mg/dL (ref 0.0–1.9)

## 2020-03-04 ENCOUNTER — Encounter: Payer: Self-pay | Admitting: Internal Medicine

## 2020-03-04 NOTE — Assessment & Plan Note (Signed)
For remeron 15 qhs

## 2020-03-04 NOTE — Assessment & Plan Note (Signed)
Unable to tolerate statin,  to f/u any worsening symptoms or concerns

## 2020-03-04 NOTE — Assessment & Plan Note (Signed)
Age and sex appropriate education and counseling updated with regular exercise and diet Referrals for preventative services - none needed Immunizations addressed - none needed except due for covid booster soon Smoking counseling  - none needed Evidence for depression or other mood disorder - none significant Most recent labs reviewed. I have personally reviewed and have noted: 1) the patient's medical and social history 2) The patient's current medications and supplements 3) The patient's height, weight, and BMI have been recorded in the chart

## 2020-03-04 NOTE — Assessment & Plan Note (Signed)
Lab Results  Component Value Date   HGBA1C 6.0 02/28/2020   Stable, pt to continue current medical treatment amaryl  Current Outpatient Medications (Endocrine & Metabolic):  .  glimepiride (AMARYL) 2 MG tablet, TAKE ONE TABLET BY MOUTH DAILY (Patient taking differently: Take 2 mg by mouth daily with breakfast.)  Current Outpatient Medications (Cardiovascular):  .  amLODipine (NORVASC) 5 MG tablet, Take 1 tablet (5 mg total) by mouth daily. .  irbesartan (AVAPRO) 150 MG tablet, Take 1 tablet (150 mg total) by mouth daily.  Current Outpatient Medications (Respiratory):  Marland Kitchen  Camphor-Eucalyptus-Menthol (VICKS VAPORUB EX), Apply 1 application topically. Marland Kitchen  loratadine (CLARITIN) 10 MG tablet, Take 10 mg by mouth daily as needed (allergies.).   Current Outpatient Medications (Analgesics):  .  acetaminophen (TYLENOL) 650 MG CR tablet, Take 650 mg by mouth every 8 (eight) hours as needed for pain. .  traMADol (ULTRAM) 50 MG tablet, Take 50 mg by mouth 2 (two) times daily as needed (ankle pain.).  Current Outpatient Medications (Hematological):  .  vitamin B-12 (CYANOCOBALAMIN) 1000 MCG tablet, Take 1,000 mcg by mouth daily.   Current Outpatient Medications (Other):  .  Blood Glucose Monitoring Suppl (ONE TOUCH ULTRA 2) w/Device KIT, Use as directed .  Cholecalciferol (VITAMIN D3) 125 MCG (5000 UT) TABS, Take 10,000-15,000 Units by mouth daily. Marland Kitchen  docusate sodium (COLACE) 100 MG capsule, Take 100-200 mg by mouth 2 (two) times daily as needed (constipation.).  Marland Kitchen  famotidine (PEPCID) 20 MG tablet, Take 20 mg by mouth 2 (two) times daily as needed for heartburn or indigestion. .  feeding supplement (BOOST HIGH PROTEIN) LIQD, Take 237 mLs by mouth daily. .  Lancets (ONETOUCH ULTRASOFT) lancets, USE TO CHECK BLOOD SUGAR TWICE DAILY .  Lidocaine HCl 4 % CREA, Apply 1 application topically 3 (three) times daily as needed (ankle pain.). Marland Kitchen  loperamide (IMODIUM) 2 MG capsule, Take 2 mg by mouth as needed  for diarrhea or loose stools.  Marland Kitchen  LORazepam (ATIVAN) 0.5 MG tablet, Take 1 tablet (0.5 mg total) by mouth 2 (two) times daily as needed for anxiety. .  mirtazapine (REMERON) 15 MG tablet, Take 1 tablet (15 mg total) by mouth at bedtime. Glory Rosebush ULTRA test strip, USE TO CHECK BLOOD SUGARS TWO TIMES DAILY .  OVER THE COUNTER MEDICATION, Apply 1 application topically daily as needed (pain). Triderma otc pain cream .  prochlorperazine (COMPAZINE) 10 MG tablet, Take 10 mg by mouth every 6 (six) hours as needed for nausea or vomiting. .  senna (SENOKOT) 8.6 MG TABS tablet, Take 1 tablet by mouth daily as needed for mild constipation. .  solifenacin (VESICARE) 5 MG tablet, Take 1 tablet (5 mg total) by mouth daily. (Patient taking differently: Take 5 mg by mouth daily as needed (bladder pain/spasms.).)

## 2020-03-04 NOTE — Assessment & Plan Note (Signed)
BP Readings from Last 3 Encounters:  02/28/20 (!) 146/74  02/09/20 (!) 156/70  01/27/20 136/88   Stable, pt to continue medical treatment amlodipine, but also add avapro 150 qd  Current Outpatient Medications (Endocrine & Metabolic):  .  glimepiride (AMARYL) 2 MG tablet, TAKE ONE TABLET BY MOUTH DAILY (Patient taking differently: Take 2 mg by mouth daily with breakfast.)  Current Outpatient Medications (Cardiovascular):  .  amLODipine (NORVASC) 5 MG tablet, Take 1 tablet (5 mg total) by mouth daily. .  irbesartan (AVAPRO) 150 MG tablet, Take 1 tablet (150 mg total) by mouth daily.  Current Outpatient Medications (Respiratory):  Marland Kitchen  Camphor-Eucalyptus-Menthol (VICKS VAPORUB EX), Apply 1 application topically. Marland Kitchen  loratadine (CLARITIN) 10 MG tablet, Take 10 mg by mouth daily as needed (allergies.).   Current Outpatient Medications (Analgesics):  .  acetaminophen (TYLENOL) 650 MG CR tablet, Take 650 mg by mouth every 8 (eight) hours as needed for pain. .  traMADol (ULTRAM) 50 MG tablet, Take 50 mg by mouth 2 (two) times daily as needed (ankle pain.).  Current Outpatient Medications (Hematological):  .  vitamin B-12 (CYANOCOBALAMIN) 1000 MCG tablet, Take 1,000 mcg by mouth daily.   Current Outpatient Medications (Other):  .  Blood Glucose Monitoring Suppl (ONE TOUCH ULTRA 2) w/Device KIT, Use as directed .  Cholecalciferol (VITAMIN D3) 125 MCG (5000 UT) TABS, Take 10,000-15,000 Units by mouth daily. Marland Kitchen  docusate sodium (COLACE) 100 MG capsule, Take 100-200 mg by mouth 2 (two) times daily as needed (constipation.).  Marland Kitchen  famotidine (PEPCID) 20 MG tablet, Take 20 mg by mouth 2 (two) times daily as needed for heartburn or indigestion. .  feeding supplement (BOOST HIGH PROTEIN) LIQD, Take 237 mLs by mouth daily. .  Lancets (ONETOUCH ULTRASOFT) lancets, USE TO CHECK BLOOD SUGAR TWICE DAILY .  Lidocaine HCl 4 % CREA, Apply 1 application topically 3 (three) times daily as needed (ankle pain.). Marland Kitchen   loperamide (IMODIUM) 2 MG capsule, Take 2 mg by mouth as needed for diarrhea or loose stools.  Marland Kitchen  LORazepam (ATIVAN) 0.5 MG tablet, Take 1 tablet (0.5 mg total) by mouth 2 (two) times daily as needed for anxiety. .  mirtazapine (REMERON) 15 MG tablet, Take 1 tablet (15 mg total) by mouth at bedtime. Glory Rosebush ULTRA test strip, USE TO CHECK BLOOD SUGARS TWO TIMES DAILY .  OVER THE COUNTER MEDICATION, Apply 1 application topically daily as needed (pain). Triderma otc pain cream .  prochlorperazine (COMPAZINE) 10 MG tablet, Take 10 mg by mouth every 6 (six) hours as needed for nausea or vomiting. .  senna (SENOKOT) 8.6 MG TABS tablet, Take 1 tablet by mouth daily as needed for mild constipation. .  solifenacin (VESICARE) 5 MG tablet, Take 1 tablet (5 mg total) by mouth daily. (Patient taking differently: Take 5 mg by mouth daily as needed (bladder pain/spasms.).)

## 2020-03-04 NOTE — Assessment & Plan Note (Signed)
Lab Results  Component Value Date   LDLCALC 166 (H) 04/12/2014   Stable, pt to continue current low chol diet

## 2020-03-04 NOTE — Assessment & Plan Note (Signed)
Last vitamin D Lab Results  Component Value Date   VD25OH 51.43 02/28/2020   Stable, cont oral replacement  

## 2020-03-04 NOTE — Assessment & Plan Note (Addendum)
To start remeron 15 qhs, declines psychiatry referral, also ativan prn

## 2020-03-06 ENCOUNTER — Ambulatory Visit (INDEPENDENT_AMBULATORY_CARE_PROVIDER_SITE_OTHER): Payer: Medicare Other | Admitting: Internal Medicine

## 2020-03-06 ENCOUNTER — Other Ambulatory Visit: Payer: Self-pay

## 2020-03-06 ENCOUNTER — Encounter: Payer: Self-pay | Admitting: Internal Medicine

## 2020-03-06 ENCOUNTER — Telehealth: Payer: Self-pay | Admitting: Internal Medicine

## 2020-03-06 VITALS — BP 148/72 | HR 78 | Temp 98.4°F | Ht 64.0 in | Wt 185.4 lb

## 2020-03-06 DIAGNOSIS — R42 Dizziness and giddiness: Secondary | ICD-10-CM

## 2020-03-06 NOTE — Patient Instructions (Signed)
Your dizziness is likely from your inner ears.  Take the lorazepam daily as needed for your symptoms.    If there is no improvement let us know and we can refer you for vestibular physical therapy.     Dizziness Dizziness is a common problem. It is a feeling of unsteadiness or light-headedness. You may feel like you are about to faint. Dizziness can lead to injury if you stumble or fall. Anyone can become dizzy, but dizziness is more common in older adults. This condition can be caused by a number of things, including medicines, dehydration, or illness. Follow these instructions at home: Eating and drinking  Drink enough fluid to keep your urine clear or pale yellow. This helps to keep you from becoming dehydrated. Try to drink more clear fluids, such as water.  Do not drink alcohol.  Limit your caffeine intake if told to do so by your health care provider. Check ingredients and nutrition facts to see if a food or beverage contains caffeine.  Limit your salt (sodium) intake if told to do so by your health care provider. Check ingredients and nutrition facts to see if a food or beverage contains sodium. Activity  Avoid making quick movements. ? Rise slowly from chairs and steady yourself until you feel okay. ? In the morning, first sit up on the side of the bed. When you feel okay, stand slowly while you hold onto something until you know that your balance is fine.  If you need to stand in one place for a long time, move your legs often. Tighten and relax the muscles in your legs while you are standing.  Do not drive or use heavy machinery if you feel dizzy.  Avoid bending down if you feel dizzy. Place items in your home so that they are easy for you to reach without leaning over. Lifestyle  Do not use any products that contain nicotine or tobacco, such as cigarettes and e-cigarettes. If you need help quitting, ask your health care provider.  Try to reduce your stress level by using  methods such as yoga or meditation. Talk with your health care provider if you need help to manage your stress. General instructions  Watch your dizziness for any changes.  Take over-the-counter and prescription medicines only as told by your health care provider. Talk with your health care provider if you think that your dizziness is caused by a medicine that you are taking.  Tell a friend or a family member that you are feeling dizzy. If he or she notices any changes in your behavior, have this person call your health care provider.  Keep all follow-up visits as told by your health care provider. This is important. Contact a health care provider if:  Your dizziness does not go away.  Your dizziness or light-headedness gets worse.  You feel nauseous.  You have reduced hearing.  You have new symptoms.  You are unsteady on your feet or you feel like the room is spinning. Get help right away if:  You vomit or have diarrhea and are unable to eat or drink anything.  You have problems talking, walking, swallowing, or using your arms, hands, or legs.  You feel generally weak.  You are not thinking clearly or you have trouble forming sentences. It may take a friend or family member to notice this.  You have chest pain, abdominal pain, shortness of breath, or sweating.  Your vision changes.  You have any bleeding.  You have  a severe headache.  You have neck pain or a stiff neck.  You have a fever. These symptoms may represent a serious problem that is an emergency. Do not wait to see if the symptoms will go away. Get medical help right away. Call your local emergency services (911 in the [place] Do not drive yourself to the hospital. Summary  Dizziness is a feeling of unsteadiness or light-headedness. This condition can be caused by a number of things, including medicines, dehydration, or illness.  Anyone can become dizzy, but dizziness is more common in older adults.  Drink  enough fluid to keep your urine clear or pale yellow. Do not drink alcohol.  Avoid making quick movements if you feel dizzy. Monitor your dizziness for any changes. This information is not intended to replace advice given to you by your health care provider. Make sure you discuss any questions you have with your health care provider. Document Revised: 01/02/2017 Document Reviewed: 02/02/2016 Elsevier Patient Education  2021 [employer].

## 2020-03-06 NOTE — Telephone Encounter (Signed)
[facility] FYI   Caller states she has been having some spells of vertigo-- room feels like it is spinning. Today, when she woke up the room was spinning for a few seconds. Floor felt like it was moving when she was in the restroom this afternoon. Then this evening around 7PM-- sat down after walk and the room began to spin again. Drinking fluids.  Advised to see PCP with 24 hours. Called the patient back to offer an appointment but patient declined. She would prefer to go to the ED instead.

## 2020-03-06 NOTE — Telephone Encounter (Signed)
[facility] states the patient has not gotten her new BP Rx from pharmacy yet.  Advised that she must get the medication this morning and start it ASAP as the dizziness could be caused from HTN. Advised if there is no appointment available, she must be seen in [facility] this morning.

## 2020-03-06 NOTE — Progress Notes (Signed)
Subjective:    Patient ID: Laura [name], female    DOB: 1945-07-27, 75 y.o.   MRN: [id number]  HPI The patient is here for an acute visit.   She is having dizziness in her head and room spinning.  It started yesterday morning when she worke up - the room was spinning.  When she sat up the room stopped spinning.  That evening she looked down and the room was spinning and when she got up it stopped.  Later she looked up and the room was spinning.  She was not able to lay down due to the spinning.  It she got up straight she was ok.     This afternoon - looking down or looking up she would see room spinning.  Her symptoms are slightly better today.   She denies changes in her meds - she has not started the two new meds from PCP.    Glucose this morning was 146     Medications and allergies reviewed with patient and updated if appropriate.  Patient Active Problem List   Diagnosis Date Noted  . Statin myopathy 02/27/2020  . Ovarian cancer ([place]) 01/03/2020  . 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 Nontraumatic tear of right tibialis posterior tendon 12/14/2019  . Right ankle pain 12/07/2019  . Right ankle tendonitis 11/11/2019  . Aortic atherosclerosis ([place]) 10/12/2019  . Dehydration 10/06/2019  . Port-A-Cath in place 07/21/2019  . Genetic testing 07/15/2019  . Elevated CA-125 07/07/2019  . Family history of thyroid cancer   . Malignant neoplasm of both ovaries 06/20/2019  . Paronychia, finger, left 04/15/2019  . Vaginitis 04/15/2019  . Abdominal pain 04/04/2019  . Retroperitoneal mass 04/03/2019  . Acute gouty arthritis 12/17/2018  . Bilateral hearing loss 12/21/2017  . Eustachian tube disorder, bilateral 12/21/2017  . Cough 02/23/2017  . Left hand pain 05/26/2016  . Mucoid cyst, joint 05/26/2016  . Pain in finger of right hand 05/26/2016  . Primary osteoarthritis of both first carpometacarpal joints 05/26/2016  . Skin  avulsion 05/26/2016  . Adhesive capsulitis of left shoulder 03/20/2016  . Fatigue 12/16/2015  . Acute bronchitis 10/31/2015  . Urinary frequency 03/09/2015  . Hearing loss, sensorineural, asymmetrical 11/09/2014  . Vestibular hypofunction, left 11/09/2014  . Obesity 07/25/2014  . Orthostatic hypotension 07/07/2014  . Upper airway cough syndrome 06/26/2014  . Diarrhea 05/02/2014  . Dizziness 04/12/2014  . Neuropathy   . Rash and nonspecific skin eruption 08/30/2013  . Paresthesia of both feet 08/30/2013  . Blurred vision 02/15/2013  . Eye pain 02/15/2013  . Sinusitis, chronic 10/07/2012  . Left shoulder pain 04/15/2012  . Encounter for well adult exam with abnormal findings 10/22/2011  . Cervical disc disease 10/22/2011  . Bilateral hand pain 08/05/2011  . SNHL (sensorineural hearing loss) 08/04/2011  . Fibrocystic breast disease 07/02/2011  . Hypertension 07/01/2011  . GERD (gastroesophageal reflux disease) 06/29/2011  . Bloating 06/29/2011  . Back pain 03/13/2011  . Vertigo 01/04/2011  . Nystagmus 01/04/2011  . Insomnia 10/21/2010  . Other constipation 08/17/2009  . Vitamin D deficiency 05/15/2009  . DYSPNEA 03/13/2009  . Depression with anxiety 01/16/2009  . Irritable bowel syndrome 01/16/2009  . Primary osteoarthritis of both hands 12/15/2008  . Diabetes mellitus type 2, noninsulin dependent ([place]) 11/03/2008  . Hyperlipidemia 11/03/2008  . Anxiety state 11/03/2008  . OVERACTIVE BLADDER 11/03/2008  . OSA (obstructive sleep apnea) 11/03/2008  . MURMUR 11/03/2008    Current Outpatient  Medications on File Prior to Visit  Medication Sig Dispense Refill  . acetaminophen (TYLENOL) 650 MG CR tablet Take 650 mg by mouth every 8 (eight) hours as needed for pain.    Marland Kitchen amLODipine (NORVASC) 5 MG tablet Take 1 tablet (5 mg total) by mouth daily. 90 tablet 3  . Blood Glucose Monitoring Suppl (ONE TOUCH ULTRA 2) w/Device KIT Use as directed 1 each 0  . Camphor-Eucalyptus-Menthol  (VICKS VAPORUB EX) Apply 1 application topically.    . Cholecalciferol (VITAMIN D3) 125 MCG (5000 UT) TABS Take 10,000-15,000 Units by mouth daily.    Marland Kitchen docusate sodium (COLACE) 100 MG capsule Take 100-200 mg by mouth 2 (two) times daily as needed (constipation.).     Marland Kitchen famotidine (PEPCID) 20 MG tablet Take 20 mg by mouth 2 (two) times daily as needed for heartburn or indigestion.    . feeding supplement (BOOST HIGH PROTEIN) LIQD Take 237 mLs by mouth daily.    Marland Kitchen glimepiride (AMARYL) 2 MG tablet TAKE ONE TABLET BY MOUTH DAILY (Patient taking differently: Take 2 mg by mouth daily with breakfast.) 90 tablet 3  . irbesartan (AVAPRO) 150 MG tablet Take 1 tablet (150 mg total) by mouth daily. 90 tablet 3  . Lancets (ONETOUCH ULTRASOFT) lancets USE TO CHECK BLOOD SUGAR TWICE DAILY 100 each 3  . Lidocaine HCl 4 % CREA Apply 1 application topically 3 (three) times daily as needed (ankle pain.).    Marland Kitchen loperamide (IMODIUM) 2 MG capsule Take 2 mg by mouth as needed for diarrhea or loose stools.     Marland Kitchen loratadine (CLARITIN) 10 MG tablet Take 10 mg by mouth daily as needed (allergies.).     Marland Kitchen LORazepam (ATIVAN) 0.5 MG tablet Take 1 tablet (0.5 mg total) by mouth 2 (two) times daily as needed for anxiety. 60 tablet 2  . mirtazapine (REMERON) 15 MG tablet Take 1 tablet (15 mg total) by mouth at bedtime. 90 tablet 1  . ONETOUCH ULTRA test strip USE TO CHECK BLOOD SUGARS TWO TIMES DAILY 100 strip 3  . OVER THE COUNTER MEDICATION Apply 1 application topically daily as needed (pain). Triderma otc pain cream    . prochlorperazine (COMPAZINE) 10 MG tablet Take 10 mg by mouth every 6 (six) hours as needed for nausea or vomiting.    . solifenacin (VESICARE) 5 MG tablet Take 1 tablet (5 mg total) by mouth daily. (Patient taking differently: Take 5 mg by mouth daily as needed (bladder pain/spasms.).) 90 tablet 3  . traMADol (ULTRAM) 50 MG tablet Take 50 mg by mouth 2 (two) times daily as needed (ankle pain.).    Marland Kitchen vitamin  B-12 (CYANOCOBALAMIN) 1000 MCG tablet Take 1,000 mcg by mouth daily.     Marland Kitchen senna (SENOKOT) 8.6 MG TABS tablet Take 1 tablet by mouth daily as needed for mild constipation. (Patient not taking: Reported on 03/06/2020)     No current facility-administered medications on file prior to visit.    Past Medical History:  Diagnosis Date  . Anemia   . Anxiety   . Arthritis    back of neck, bones spurs on neck  . Cancer ([place])    ovarian cancer  . Cataract   . Cervical disc disease   . Diabetes mellitus   . Family history of thyroid cancer   . Heart murmur   . Hyperlipidemia   . Hypertension   . Mucoid cyst of joint    right thumb  . Neuropathy   . Port-A-Cath in  place 07/21/2019  . Reflux   . Sleep apnea    wears CPAP nightly  . Vertigo     Past Surgical History:  Procedure Laterality Date  . ABDOMINAL HYSTERECTOMY     vaginal  . BLADDER SURGERY    . BREAST EXCISIONAL BIOPSY Bilateral   . BREAST SURGERY    . CHOLECYSTECTOMY    . fibroids removed     breast (both breasts)  . IR IMAGING GUIDED PORT INSERTION  07/26/2019   right  . MASS EXCISION Right 06/26/2016   Procedure: EXCISION MUCOID TUMOR RIGHT THUMB, IP RIGHT THUMB;  Surgeon: Daryll Brod, MD;  Location: [facility];  Service: Orthopedics;  Laterality: Right;  . PARTIAL HYSTERECTOMY    . ROBOTIC ASSISTED BILATERAL SALPINGO OOPHERECTOMY N/A 01/03/2020   Procedure: XI ROBOTIC ASSISTED BILATERAL SALPINGO OOPHORECTOMY, RADICAL TUMOR DEBULKING;  Surgeon: Everitt Amber, MD;  Location: [facility];  Service: Gynecology;  Laterality: N/A;  . ROBOTIC PELVIC AND PARA-AORTIC LYMPH NODE DISSECTION N/A 01/03/2020   Procedure: XI ROBOTIC PARA-AORTIC LYMPHADENECTOMY;  Surgeon: Everitt Amber, MD;  Location: [facility];  Service: Gynecology;  Laterality: N/A;    Social History   Socioeconomic History  . Marital status: Divorced    Spouse name: Not on file  . Number of children: 4  . Years of education: 5  . Highest education level:  Not on file  Occupational History  . Occupation: retired Geographical information systems officer  Tobacco Use  . Smoking status: Never Smoker  . Smokeless tobacco: Never Used  Vaping Use  . Vaping Use: Never used  Substance and Sexual Activity  . Alcohol use: No    Alcohol/week: 0.0 standard drinks  . Drug use: No  . Sexual activity: Not Currently  Other Topics Concern  . Not on file  Social History Narrative  . Not on file   Social Determinants of Health   Financial Resource Strain: Low Risk   . Difficulty of Paying Living Expenses: Not hard at all  Food Insecurity: No Food Insecurity  . Worried About Charity fundraiser in the Last Year: Never true  . Ran Out of Food in the Last Year: Never true  Transportation Needs: No Transportation Needs  . Lack of Transportation (Medical): No  . Lack of Transportation (Non-Medical): No  Physical Activity: Inactive  . Days of Exercise per Week: 0 days  . Minutes of Exercise per Session: 0 min  Stress: No Stress Concern Present  . Feeling of Stress : Not at all  Social Connections: Moderately Integrated  . Frequency of Communication with Friends and Family: More than three times a week  . Frequency of Social Gatherings with Friends and Family: More than three times a week  . Attends Religious Services: More than 4 times per year  . Active Member of Clubs or Organizations: Yes  . Attends Archivist Meetings: Never  . Marital Status: Divorced    Family History  Problem Relation Age of Onset  . Hypertension Other   . Diabetes Father   . Hypertension Father   . Diabetes Sister   . Hypertension Mother   . Hypertension Sister   . Stroke Sister   . Diabetes Maternal Aunt   . Thyroid cancer Daughter 71  . Colon cancer Neg Hx   . Esophageal cancer Neg Hx   . Stomach cancer Neg Hx   . Rectal cancer Neg Hx   . Breast cancer Neg Hx   . Endometrial cancer Neg Hx   .  Ovarian cancer Neg Hx     Review of Systems  Constitutional:  Negative for fever.  HENT: Negative for congestion, ear pain, sinus pressure, sinus pain and sore throat.   Eyes: Positive for visual disturbance (a little but this is normal for her).  Respiratory: Negative for cough, shortness of breath and wheezing.   Gastrointestinal: Negative for nausea.  Neurological: Positive for dizziness. Negative for weakness, light-headedness and numbness.       Objective:   Vitals:   03/06/20 1537  BP: (!) 148/72  Pulse: 78  Temp: 98.4 F (36.9 C)  SpO2: 98%   BP Readings from Last 3 Encounters:  03/06/20 (!) 148/72  02/28/20 (!) 146/74  02/09/20 (!) 156/70   Wt Readings from Last 3 Encounters:  03/06/20 185 lb 6.4 oz (84.1 kg)  02/28/20 185 lb (83.9 kg)  02/09/20 182 lb 14.4 oz (83 kg)   Body mass index is 31.82 kg/m.   Physical Exam Constitutional:      General: She is not in acute distress.    Appearance: Normal appearance. She is not ill-appearing.  HENT:     Head: Normocephalic and atraumatic.     Right Ear: Tympanic membrane, ear canal and external ear normal. There is no impacted cerumen.     Left Ear: Tympanic membrane, ear canal and external ear normal. There is no impacted cerumen.     Mouth/Throat:     Mouth: Mucous membranes are moist.     Pharynx: No oropharyngeal exudate or posterior oropharyngeal erythema.  Eyes:     Extraocular Movements: Extraocular movements intact.  Cardiovascular:     Rate and Rhythm: Normal rate and regular rhythm.  Pulmonary:     Effort: Pulmonary effort is normal. No respiratory distress.     Breath sounds: No wheezing or rales.  Skin:    General: Skin is warm and dry.  Neurological:     General: No focal deficit present.     Mental Status: She is alert.     Cranial Nerves: No cranial nerve deficit.     Sensory: No sensory deficit.     Motor: No weakness.            Assessment & Plan:    I spent 20 minutes dedicated to the care of this patient on the date of this encounter  including review of recent labs, imaging and procedures, speciality notes, obtaining history, communicating with the patient, ordering medications, tests, and documenting clinical information in the EHR   See Problem List for Assessment and Plan of chronic medical problems.    This visit occurred during the SARS-CoV-2 public health emergency.  Safety protocols were in place, including screening questions prior to the visit, additional usage of staff PPE, and extensive cleaning of exam room while observing appropriate contact time as indicated for disinfecting solutions.

## 2020-03-06 NOTE — Assessment & Plan Note (Signed)
Acute Has had > 1 episode in the past - related to inner ear - has done vestibular PT in past MRI in past with prior episode normal No concerning neurological symptoms, non-focal neuro exam, unlikely cva No evidence of infection, cardiac cause Likely BPPV - related to head movements She take lorazepam BID prn but does not take often - advised to take as needed over the next few days If no improvement can refer to vestibular PT

## 2020-03-07 NOTE — Progress Notes (Signed)
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 Patient and counselor discussed the Cycle of Depression sheet and processed patient's experience with it. Patient described the conflict between not wanting to talk to others and feeling all their support. Counselor began to introduce automatic negative thoughts. Counselor provided empathic listening, psychoeducation, and a space to process her experience. Patient was oriented times three and in a mostly stressed and sad mood. Her affect was somewhat flat, but mostly appropriate to context. There were no signs of SI/HI/NSSI. Patient is doing well medically, but often feels depressed.Thoughts of "What if my cancer comes back?" or "What if I push my loved ones away?"bring up fear and anxiety, which result in the patient not wanting to do anything (I.e. frozen in fear).Next session will work on identifying patient's automatic negative thoughts.   Gaylyn Rong Counseling Intern

## 2020-03-09 ENCOUNTER — Telehealth: Payer: Self-pay

## 2020-03-09 NOTE — Telephone Encounter (Signed)
Followed up with patient. Patient states she is feeling much better and no longer feels extremely dizziness. I asked if she has picked her medication yet she states she was advise not to start it by another provider.

## 2020-03-14 NOTE — Progress Notes (Signed)
[place] Patient and [facility] Counseling Note  The patient sounded upbeat and happier when she answered the phone. The patient reported that she had been more active and talking to others. Interacting with others had made the patient feel better and she also liked being able to help others. However, the patient had been turning away help from others because she "didn't need it." The patient worried whether this was a bad sign. The patient described being "shaky" when trying to explain how she felt to others. The patient said she felt she was making her way out of depression, so counselor asked what else needed to be worked on between now and the end of April. The patient proposed ideas. The counselor provided psychoeducation, active listening, and reflections throughout session. The patient was oriented times three and in a cheery mood. The patient's affect was appropriate to context and there were no signs of SI/HI/NSSI. Patient is doing well medically, but often feels depressed.Thoughts of "What if my cancer comes back?" or "What if I push my loved ones away?"bring up fear and anxiety, which result in the patient not wanting to do anything (I.e. frozen in fear).Next session will explore finding purpose and processing emotions.    Gaylyn Rong Counseling Intern

## 2020-03-15 ENCOUNTER — Other Ambulatory Visit: Payer: Self-pay

## 2020-03-18 NOTE — Progress Notes (Signed)
Subjective:    Patient ID: Laura [name], female    DOB: [birth year], 75 y.o.   MRN: [id number]  HPI The patient is here for an acute visit.  Vertigo she was here 2/22 for dizziness and room spinning.  Her symptoms had improved, but a few days ago it started again after she was laying on her bed.  She has had a few episodes of spinning since then.   Most of the time she is not dizzy.  She may get dizzy if she turns in the bed or lays down certain ways.  During the day she is usually fine.  Every once in a while she feels lightheaded.    She has done vestibular therapy in the past.    She took ativan at night for a few nights after she was here.      No issues with balance during the day. No cold symptoms.  No N/T or weakness.    Medications and allergies reviewed with patient and updated if appropriate.  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   Nontraumatic tear of right tibialis posterior tendon 12/14/2019   Right ankle pain 12/07/2019   Right ankle tendonitis 11/11/2019   Aortic atherosclerosis ([place]) 10/12/2019   Dehydration 10/06/2019   Port-A-Cath in place 07/21/2019   Genetic testing 07/15/2019   Elevated CA-125 07/07/2019   Family history of thyroid cancer    Malignant neoplasm of both ovaries 06/20/2019   Paronychia, finger, left 04/15/2019   Vaginitis 04/15/2019   Abdominal pain 04/04/2019   Retroperitoneal mass 04/03/2019   Acute gouty arthritis 12/17/2018   Bilateral hearing loss 12/21/2017   Eustachian tube disorder, bilateral 12/21/2017   Cough 02/23/2017   Left hand pain 05/26/2016   Mucoid cyst, joint 05/26/2016   Pain in finger of right hand 05/26/2016   Primary osteoarthritis of both first carpometacarpal joints 05/26/2016   Skin avulsion 05/26/2016   Adhesive  capsulitis of left shoulder 03/20/2016   Fatigue 12/16/2015   Acute bronchitis 10/31/2015   Urinary frequency 03/09/2015   Hearing loss, sensorineural, asymmetrical 11/09/2014   Vestibular hypofunction, left 11/09/2014   Obesity 07/25/2014   Orthostatic hypotension 07/07/2014   Upper airway cough syndrome 06/26/2014   Diarrhea 05/02/2014   Dizziness 04/12/2014   Neuropathy    Rash and nonspecific skin eruption 08/30/2013   Paresthesia of both feet 08/30/2013   Blurred vision 02/15/2013   Eye pain 02/15/2013   Sinusitis, chronic 10/07/2012   Left shoulder pain 04/15/2012   Encounter for well adult exam with abnormal findings 10/22/2011   Cervical disc disease 10/22/2011   Bilateral hand pain 08/05/2011   SNHL (sensorineural hearing loss) 08/04/2011   Fibrocystic breast disease 07/02/2011   Hypertension 07/01/2011   GERD (gastroesophageal reflux disease) 06/29/2011   Bloating 06/29/2011   Back pain 03/13/2011   Vertigo 01/04/2011   Nystagmus 01/04/2011   Insomnia 10/21/2010   Other constipation 08/17/2009   Vitamin D deficiency 05/15/2009   DYSPNEA 03/13/2009   Depression with anxiety 01/16/2009   Irritable bowel syndrome 01/16/2009   Primary osteoarthritis of both hands 12/15/2008   Diabetes mellitus type 2, noninsulin dependent ([place]) 11/03/2008   Hyperlipidemia 11/03/2008   Anxiety state 11/03/2008   OVERACTIVE BLADDER 11/03/2008   OSA (obstructive sleep apnea) 11/03/2008   MURMUR 11/03/2008    Current Outpatient Medications on File Prior  to Visit  Medication Sig Dispense Refill   acetaminophen (TYLENOL) 650 MG CR tablet Take 650 mg by mouth every 8 (eight) hours as needed for pain.     amLODipine (NORVASC) 5 MG tablet Take 1 tablet (5 mg total) by mouth daily. 90 tablet 3   Blood Glucose Monitoring Suppl (ONE TOUCH ULTRA 2) w/Device KIT Use as directed 1 each 0   Camphor-Eucalyptus-Menthol (VICKS VAPORUB EX) Apply 1  application topically.     Cholecalciferol (VITAMIN D3) 125 MCG (5000 UT) TABS Take 10,000-15,000 Units by mouth daily.     docusate sodium (COLACE) 100 MG capsule Take 100-200 mg by mouth 2 (two) times daily as needed (constipation.).      famotidine (PEPCID) 20 MG tablet Take 20 mg by mouth 2 (two) times daily as needed for heartburn or indigestion.     feeding supplement (BOOST HIGH PROTEIN) LIQD Take 237 mLs by mouth daily.     glimepiride (AMARYL) 2 MG tablet TAKE ONE TABLET BY MOUTH DAILY (Patient taking differently: Take 2 mg by mouth daily with breakfast.) 90 tablet 3   Lancets (ONETOUCH ULTRASOFT) lancets USE TO CHECK BLOOD SUGAR TWICE DAILY 100 each 3   loperamide (IMODIUM) 2 MG capsule Take 2 mg by mouth as needed for diarrhea or loose stools.      loratadine (CLARITIN) 10 MG tablet Take 10 mg by mouth daily as needed (allergies.).      LORazepam (ATIVAN) 0.5 MG tablet Take 1 tablet (0.5 mg total) by mouth 2 (two) times daily as needed for anxiety. 60 tablet 2   ONETOUCH ULTRA test strip USE TO CHECK BLOOD SUGARS TWO TIMES DAILY 100 strip 3   OVER THE COUNTER MEDICATION Apply 1 application topically daily as needed (pain). Triderma otc pain cream     prochlorperazine (COMPAZINE) 10 MG tablet Take 10 mg by mouth every 6 (six) hours as needed for nausea or vomiting.     solifenacin (VESICARE) 5 MG tablet Take 1 tablet (5 mg total) by mouth daily. (Patient taking differently: Take 5 mg by mouth daily as needed (bladder pain/spasms.).) 90 tablet 3   traMADol (ULTRAM) 50 MG tablet Take 50 mg by mouth 2 (two) times daily as needed (ankle pain.).     vitamin B-12 (CYANOCOBALAMIN) 1000 MCG tablet Take 1,000 mcg by mouth daily.      irbesartan (AVAPRO) 150 MG tablet Take 1 tablet (150 mg total) by mouth daily. (Patient not taking: Reported on 03/19/2020) 90 tablet 3   No current facility-administered medications on file prior to visit.    Past Medical History:  Diagnosis Date    Anemia    Anxiety    Arthritis    back of neck, bones spurs on neck   Cancer (HCC)    ovarian cancer   Cataract    Cervical disc disease    Diabetes mellitus    Family history of thyroid cancer    Heart murmur    Hyperlipidemia    Hypertension    Mucoid cyst of joint    right thumb   Neuropathy    Port-A-Cath in place 07/21/2019   Reflux    Sleep apnea    wears CPAP nightly   Vertigo     Past Surgical History:  Procedure Laterality Date   ABDOMINAL HYSTERECTOMY     vaginal   BLADDER SURGERY     BREAST EXCISIONAL BIOPSY Bilateral    BREAST SURGERY     CHOLECYSTECTOMY     fibroids  removed     breast (both breasts)   IR IMAGING GUIDED PORT INSERTION  07/26/2019   right   MASS EXCISION Right 06/26/2016   Procedure: EXCISION MUCOID TUMOR RIGHT THUMB, IP RIGHT THUMB;  Surgeon: Daryll Brod, MD;  Location: [facility];  Service: Orthopedics;  Laterality: Right;   PARTIAL HYSTERECTOMY     ROBOTIC ASSISTED BILATERAL SALPINGO OOPHERECTOMY N/A 01/03/2020   Procedure: XI ROBOTIC ASSISTED BILATERAL SALPINGO OOPHORECTOMY, RADICAL TUMOR DEBULKING;  Surgeon: Everitt Amber, MD;  Location: [facility];  Service: Gynecology;  Laterality: N/A;   ROBOTIC PELVIC AND PARA-AORTIC LYMPH NODE DISSECTION N/A 01/03/2020   Procedure: XI ROBOTIC PARA-AORTIC LYMPHADENECTOMY;  Surgeon: Everitt Amber, MD;  Location: [facility];  Service: Gynecology;  Laterality: N/A;    Social History   Socioeconomic History   Marital status: Divorced    Spouse name: Not on file   Number of children: 4   Years of education: 16   Highest education level: Not on file  Occupational History   Occupation: retired Geographical information systems officer  Tobacco Use   Smoking status: Never Smoker   Smokeless tobacco: Never Used  Scientific laboratory technician Use: Never used  Substance and Sexual Activity   Alcohol use: No    Alcohol/week: 0.0 standard drinks   Drug use: No   Sexual activity: Not  Currently  Other Topics Concern   Not on file  Social History Narrative   Not on file   Social Determinants of Health   Financial Resource Strain: Low Risk    Difficulty of Paying Living Expenses: Not hard at all  Food Insecurity: No Food Insecurity   Worried About Charity fundraiser in the Last Year: Never true   [place] in the Last Year: Never true  Transportation Needs: No Transportation Needs   Lack of Transportation (Medical): No   Lack of Transportation (Non-Medical): No  Physical Activity: Inactive   Days of Exercise per Week: 0 days   Minutes of Exercise per Session: 0 min  Stress: No Stress Concern Present   Feeling of Stress : Not at all  Social Connections: Moderately Integrated   Frequency of Communication with Friends and Family: More than three times a week   Frequency of Social Gatherings with Friends and Family: More than three times a week   Attends Religious Services: More than 4 times per year   Active Member of [employer] or Organizations: Yes   Attends Archivist Meetings: Never   Marital Status: Divorced    Family History  Problem Relation Age of Onset   Hypertension Other    Diabetes Father    Hypertension Father    Diabetes Sister    Hypertension Mother    Hypertension Sister    Stroke Sister    Diabetes Maternal Aunt    Thyroid cancer Daughter 19   Colon cancer Neg Hx    Esophageal cancer Neg Hx    Stomach cancer Neg Hx    Rectal cancer Neg Hx    Breast cancer Neg Hx    Endometrial cancer Neg Hx    Ovarian cancer Neg Hx     Review of Systems  Constitutional: Negative for fever.  Musculoskeletal: Negative for gait problem.  Neurological: Positive for dizziness and light-headedness. Negative for weakness, numbness and headaches.       Objective:   Vitals:   03/19/20 1421  BP: 136/74  Pulse: 82  Temp: 98.4 F (36.9 C)  SpO2:  95%   BP Readings from Last 3 Encounters:  03/19/20 136/74   03/06/20 (!) 148/72  02/28/20 (!) 146/74   Wt Readings from Last 3 Encounters:  03/19/20 187 lb (84.8 kg)  03/06/20 185 lb 6.4 oz (84.1 kg)  02/28/20 185 lb (83.9 kg)   Body mass index is 32.1 kg/m.   Physical Exam Constitutional:      General: She is not in acute distress.    Appearance: Normal appearance. She is not ill-appearing.  HENT:     Head: Normocephalic and atraumatic.  Cardiovascular:     Rate and Rhythm: Normal rate and regular rhythm.     Heart sounds: No murmur heard.   Pulmonary:     Effort: Pulmonary effort is normal.     Breath sounds: Normal breath sounds.  Musculoskeletal:     Right lower leg: No edema.     Left lower leg: No edema.  Skin:    General: Skin is warm and dry.  Neurological:     General: No focal deficit present.     Mental Status: She is alert.  Psychiatric:        Mood and Affect: Mood normal.            Assessment & Plan:    See Problem List for Assessment and Plan of chronic medical problems.    This visit occurred during the SARS-CoV-2 public health emergency.  Safety protocols were in place, including screening questions prior to the visit, additional usage of staff PPE, and extensive cleaning of exam room while observing appropriate contact time as indicated for disinfecting solutions.

## 2020-03-19 ENCOUNTER — Ambulatory Visit (INDEPENDENT_AMBULATORY_CARE_PROVIDER_SITE_OTHER): Payer: Medicare Other | Admitting: Internal Medicine

## 2020-03-19 ENCOUNTER — Encounter: Payer: Self-pay | Admitting: Internal Medicine

## 2020-03-19 ENCOUNTER — Other Ambulatory Visit: Payer: Self-pay

## 2020-03-19 VITALS — BP 136/74 | HR 82 | Temp 98.4°F | Ht 64.0 in | Wt 187.0 lb

## 2020-03-19 DIAGNOSIS — R42 Dizziness and giddiness: Secondary | ICD-10-CM

## 2020-03-19 NOTE — Assessment & Plan Note (Signed)
Subacute Symptoms improved, then returned Symptoms still suggestive of BPPV Deferred PT at this time, but will let me know if she wants a referral - if symptoms do not improve Deferred neuro referral right now We will hold off on MRI of brain since this is recurrent and is similar to what she has had in the past - pattern fits BPPV Can take ativan prn if needed Call if no improvement

## 2020-03-19 NOTE — Patient Instructions (Signed)
   Let me know if you want to do vestibular PT.

## 2020-03-29 NOTE — Progress Notes (Signed)
[place] Patient and [facility] Counseling Note   Patient and counselor had brief check-in due to patient's intestinal issues. The patient and counselor discussed the patient's interactions with others, improvements and challenges, and discussed why she may feel this way. The counselor provided reflective listening, as well as normalization and validation of feelings. The patient was oriented times three and in a mostly hopeful mood. Her affect was appropriate to context and she showed no signs of SI/HI/NSSI. Thoughts of "What if my cancer comes back?" or "What if I push my loved ones away?"bring up fear and anxiety, which result in the patient not wanting to do anything (I.e. frozen in fear).The patient has increased socialization with others. Next session will check in and practice acceptance of thoughts.   Gaylyn Rong Counseling Intern

## 2020-04-09 ENCOUNTER — Inpatient Hospital Stay (HOSPITAL_COMMUNITY): Payer: Medicare Other | Attending: Hematology

## 2020-04-09 ENCOUNTER — Other Ambulatory Visit: Payer: Self-pay

## 2020-04-09 ENCOUNTER — Inpatient Hospital Stay (HOSPITAL_BASED_OUTPATIENT_CLINIC_OR_DEPARTMENT_OTHER): Payer: Medicare Other | Admitting: Hematology

## 2020-04-09 ENCOUNTER — Other Ambulatory Visit (HOSPITAL_COMMUNITY): Payer: Self-pay | Admitting: Hematology

## 2020-04-09 ENCOUNTER — Encounter (HOSPITAL_COMMUNITY): Payer: Self-pay | Admitting: Hematology

## 2020-04-09 VITALS — BP 150/74 | HR 82 | Temp 97.0°F | Resp 18 | Wt 187.3 lb

## 2020-04-09 DIAGNOSIS — F419 Anxiety disorder, unspecified: Secondary | ICD-10-CM | POA: Diagnosis not present

## 2020-04-09 DIAGNOSIS — C569 Malignant neoplasm of unspecified ovary: Secondary | ICD-10-CM | POA: Diagnosis not present

## 2020-04-09 DIAGNOSIS — E785 Hyperlipidemia, unspecified: Secondary | ICD-10-CM | POA: Insufficient documentation

## 2020-04-09 DIAGNOSIS — I10 Essential (primary) hypertension: Secondary | ICD-10-CM | POA: Insufficient documentation

## 2020-04-09 DIAGNOSIS — Z90710 Acquired absence of both cervix and uterus: Secondary | ICD-10-CM | POA: Insufficient documentation

## 2020-04-09 DIAGNOSIS — M5080 Other cervical disc disorders, unspecified cervical region: Secondary | ICD-10-CM | POA: Insufficient documentation

## 2020-04-09 DIAGNOSIS — K5900 Constipation, unspecified: Secondary | ICD-10-CM | POA: Insufficient documentation

## 2020-04-09 DIAGNOSIS — Z90722 Acquired absence of ovaries, bilateral: Secondary | ICD-10-CM | POA: Insufficient documentation

## 2020-04-09 DIAGNOSIS — Z95828 Presence of other vascular implants and grafts: Secondary | ICD-10-CM

## 2020-04-09 DIAGNOSIS — N3281 Overactive bladder: Secondary | ICD-10-CM | POA: Diagnosis not present

## 2020-04-09 DIAGNOSIS — R600 Localized edema: Secondary | ICD-10-CM | POA: Diagnosis not present

## 2020-04-09 DIAGNOSIS — Z7984 Long term (current) use of oral hypoglycemic drugs: Secondary | ICD-10-CM | POA: Insufficient documentation

## 2020-04-09 DIAGNOSIS — C563 Malignant neoplasm of bilateral ovaries: Secondary | ICD-10-CM | POA: Insufficient documentation

## 2020-04-09 DIAGNOSIS — M1990 Unspecified osteoarthritis, unspecified site: Secondary | ICD-10-CM | POA: Insufficient documentation

## 2020-04-09 DIAGNOSIS — Z9221 Personal history of antineoplastic chemotherapy: Secondary | ICD-10-CM | POA: Diagnosis not present

## 2020-04-09 DIAGNOSIS — E119 Type 2 diabetes mellitus without complications: Secondary | ICD-10-CM | POA: Insufficient documentation

## 2020-04-09 DIAGNOSIS — M545 Low back pain, unspecified: Secondary | ICD-10-CM | POA: Insufficient documentation

## 2020-04-09 DIAGNOSIS — R011 Cardiac murmur, unspecified: Secondary | ICD-10-CM | POA: Insufficient documentation

## 2020-04-09 DIAGNOSIS — Z79899 Other long term (current) drug therapy: Secondary | ICD-10-CM | POA: Insufficient documentation

## 2020-04-09 LAB — CBC WITH DIFFERENTIAL/PLATELET
Abs Immature Granulocytes: 0.02 10*3/uL (ref 0.00–0.07)
Basophils Absolute: 0 10*3/uL (ref 0.0–0.1)
Basophils Relative: 0 %
Eosinophils Absolute: 0.1 10*3/uL (ref 0.0–0.5)
Eosinophils Relative: 2 %
HCT: 34.6 % — ABNORMAL LOW (ref 36.0–46.0)
Hemoglobin: 11.5 g/dL — ABNORMAL LOW (ref 12.0–15.0)
Immature Granulocytes: 0 %
Lymphocytes Relative: 29 %
Lymphs Abs: 1.8 10*3/uL (ref 0.7–4.0)
MCH: 30.3 pg (ref 26.0–34.0)
MCHC: 33.2 g/dL (ref 30.0–36.0)
MCV: 91.1 fL (ref 80.0–100.0)
Monocytes Absolute: 0.4 10*3/uL (ref 0.1–1.0)
Monocytes Relative: 6 %
Neutro Abs: 3.9 10*3/uL (ref 1.7–7.7)
Neutrophils Relative %: 63 %
Platelets: 187 10*3/uL (ref 150–400)
RBC: 3.8 MIL/uL — ABNORMAL LOW (ref 3.87–5.11)
RDW: 13.5 % (ref 11.5–15.5)
WBC: 6.3 10*3/uL (ref 4.0–10.5)
nRBC: 0 % (ref 0.0–0.2)

## 2020-04-09 LAB — COMPREHENSIVE METABOLIC PANEL
ALT: 13 U/L (ref 0–44)
AST: 16 U/L (ref 15–41)
Albumin: 4.1 g/dL (ref 3.5–5.0)
Alkaline Phosphatase: 58 U/L (ref 38–126)
Anion gap: 7 (ref 5–15)
BUN: 20 mg/dL (ref 8–23)
CO2: 23 mmol/L (ref 22–32)
Calcium: 9.6 mg/dL (ref 8.9–10.3)
Chloride: 104 mmol/L (ref 98–111)
Creatinine, Ser: 0.99 mg/dL (ref 0.44–1.00)
GFR, Estimated: 59 mL/min — ABNORMAL LOW (ref >60)
Glucose, Bld: 151 mg/dL — ABNORMAL HIGH (ref 70–99)
Potassium: 3.6 mmol/L (ref 3.5–5.1)
Sodium: 134 mmol/L — ABNORMAL LOW (ref 135–145)
Total Bilirubin: 0.2 mg/dL — ABNORMAL LOW (ref 0.3–1.2)
Total Protein: 7.3 g/dL (ref 6.5–8.1)

## 2020-04-09 MED ORDER — SODIUM CHLORIDE 0.9% FLUSH
10.0000 mL | Freq: Once | INTRAVENOUS | Status: AC
  Administered 2020-04-09: 10 mL via INTRAVENOUS

## 2020-04-09 MED ORDER — HEPARIN SOD (PORK) LOCK FLUSH 100 UNIT/ML IV SOLN
500.0000 [IU] | Freq: Once | INTRAVENOUS | Status: AC
  Administered 2020-04-09: 500 [IU] via INTRAVENOUS

## 2020-04-09 MED ORDER — VIBEGRON 75 MG PO TABS: 75.0000 mg | ORAL_TABLET | Freq: Every day | ORAL | 2 refills | Status: DC

## 2020-04-09 NOTE — Progress Notes (Signed)
Laura [name], [place] [address]   CLINIC:  Medical Oncology/Hematology  PCP:  Biagio Borg, MD [place] / [place] [address] [phone]   REASON FOR VISIT:  Follow-up for ovarian cancer  PRIOR THERAPY:  1. Carboplatin, paclitaxel and Aloxi x 6 cycles from 07/28/2019 to 11/17/2019. 2. Robotic assisted BSO with tumor debulking on 01/03/2020.  NGS Results: Foundation 1 MS--stable, TMB 0 Muts/Mb  CURRENT THERAPY: Surveillance  BRIEF ONCOLOGIC HISTORY:  Oncology History  Malignant neoplasm of both ovaries  06/20/2019 Initial Diagnosis   Primary ovarian adenocarcinoma, unspecified laterality ([facility])   07/01/2019 Genetic Testing   Foundation One     07/11/2019 Genetic Testing   Negative genetic testing:  No pathogenic variants detected on the [facility] Multi-Cancer Panel. The report date is 07/11/2019.  The Multi-Cancer Panel offered by [facility] includes sequencing and/or deletion duplication testing of the following 85 genes: AIP, ALK, APC, ATM, AXIN2,BAP1,  BARD1, BLM, BMPR1A, BRCA1, BRCA2, BRIP1, CASR, CDC73, CDH1, CDK4, CDKN1B, CDKN1C, CDKN2A (p14ARF), CDKN2A (p16INK4a), CEBPA, CHEK2, CTNNA1, DICER1, DIS3L2, EGFR (c.2369C>T, p.Thr790Met variant only), EPCAM (Deletion/duplication testing only), FH, FLCN, GATA2, GPC3, GREM1 (Promoter region deletion/duplication testing only), HOXB13 (c.251G>A, p.Gly84Glu), HRAS, KIT, MAX, MEN1, MET, MITF (c.952G>A, p.Glu318Lys variant only), MLH1, MSH2, MSH3, MSH6, MUTYH, NBN, NF1, NF2, NTHL1, PALB2, PDGFRA, PHOX2B, PMS2, POLD1, POLE, POT1, PRKAR1A, PTCH1, PTEN, RAD50, RAD51C, RAD51D, RB1, RECQL4, RET, RNF43, RUNX1, SDHAF2, SDHA (sequence changes only), SDHB, SDHC, SDHD, SMAD4, SMARCA4, SMARCB1, SMARCE1, STK11, SUFU, TERC, TERT, TMEM127, TP53, TSC1, TSC2, VHL, WRN and WT1.   07/28/2019 -  Chemotherapy   The patient had palonosetron (ALOXI) injection 0.25 mg, 0.25 mg, Intravenous,  Once, 6 of 7  cycles Administration: 0.25 mg (07/28/2019), 0.25 mg (08/18/2019), 0.25 mg (09/15/2019), 0.25 mg (10/06/2019), 0.25 mg (10/27/2019), 0.25 mg (11/17/2019) pegfilgrastim (NEULASTA ONPRO KIT) injection 6 mg, 6 mg, Subcutaneous, Once, 6 of 7 cycles Administration: 6 mg (07/28/2019), 6 mg (08/18/2019), 6 mg (09/15/2019), 6 mg (10/06/2019), 6 mg (10/27/2019), 6 mg (11/17/2019) CARBOplatin (PARAPLATIN) 470 mg in sodium chloride 0.9 % 250 mL chemo infusion, 470 mg (100 % of original dose 470 mg), Intravenous,  Once, 6 of 7 cycles Dose modification:   (original dose 470 mg, Cycle 1, Reason: Provider Judgment),   (original dose 568.2 mg, Cycle 2),   (original dose 460.5 mg, Cycle 3), 460 mg (original dose 460 mg, Cycle 4, Reason: Provider Judgment),   (original dose 473.5 mg, Cycle 5, Reason: Provider Judgment), 460 mg (original dose 460 mg, Cycle 6) Administration: 470 mg (07/28/2019), 570 mg (08/18/2019), 460 mg (09/15/2019), 460 mg (10/06/2019), 460 mg (10/27/2019), 460 mg (11/17/2019) fosaprepitant (EMEND) 150 mg in sodium chloride 0.9 % 145 mL IVPB, 150 mg, Intravenous,  Once, 6 of 7 cycles Administration: 150 mg (07/28/2019), 150 mg (08/18/2019), 150 mg (09/15/2019), 150 mg (10/06/2019), 150 mg (10/27/2019), 150 mg (11/17/2019) PACLitaxel (TAXOL) 282 mg in sodium chloride 0.9 % 250 mL chemo infusion (> 24m/m2), 140 mg/m2 = 282 mg (80 % of original dose 175 mg/m2), Intravenous,  Once, 6 of 7 cycles Dose modification: 140 mg/m2 (80 % of original dose 175 mg/m2, Cycle 1, Reason: Provider Judgment), 150 mg/m2 (original dose 175 mg/m2, Cycle 2, Reason: Provider Judgment), 140 mg/m2 (original dose 175 mg/m2, Cycle 3, Reason: Provider Judgment), 140 mg/m2 (original dose 175 mg/m2, Cycle 4, Reason: Other (see comments), Comment: fatigue) Administration: 282 mg (07/28/2019), 300 mg (08/18/2019), 282 mg (09/15/2019), 282 mg (10/06/2019), 282 mg (10/27/2019), 282  mg (11/17/2019)  for chemotherapy treatment.      CANCER STAGING: Cancer Staging No  matching staging information was found for the patient.  INTERVAL HISTORY:  [name], a 75 y.o. female, returns for routine follow-up of her ovarian cancer. Laura [name] was last seen on 02/09/2020.   Today she reports feeling fair. She notes that her strength has not recovered since finishing chemo. She complains of incompletely emptying her bladder and is frequently constipated, but denies having dysuria. She is having a BM every 2 days. She gets up during the night frequently and her sleep has suffered. She is taking Colace daily and stopped Senakot. She is also drinking 1 can of Boost daily. Her depression and sleepiness have improved since January. Her appetite is okay and she denies having abdominal pain.  She will follow-up with Dr. Denman George on 04/13.   REVIEW OF SYSTEMS:  Review of Systems  Constitutional: Positive for appetite change (50%) and fatigue (25%).  Gastrointestinal: Positive for constipation (on Colace; BM q 2 days). Negative for abdominal pain.  Genitourinary: Positive for difficulty urinating (unable to completely empty bladder) and nocturia. Negative for dysuria.   Musculoskeletal: Positive for myalgias (thighs aching at night).  Neurological: Positive for dizziness and numbness (burning in toes).  Psychiatric/Behavioral: Positive for depression and sleep disturbance (d/t nocturia).  All other systems reviewed and are negative.   PAST MEDICAL/SURGICAL HISTORY:  Past Medical History:  Diagnosis Date  . Anemia   . Anxiety   . Arthritis    back of neck, bones spurs on neck  . Cancer ([facility])    ovarian cancer  . Cataract   . Cervical disc disease   . Diabetes mellitus   . Family history of thyroid cancer   . Heart murmur   . Hyperlipidemia   . Hypertension   . Mucoid cyst of joint    right thumb  . Neuropathy   . Port-A-Cath in place 07/21/2019  . Reflux   . Sleep apnea    wears CPAP nightly  . Vertigo    Past Surgical History:  Procedure Laterality Date   . ABDOMINAL HYSTERECTOMY     vaginal  . BLADDER SURGERY    . BREAST EXCISIONAL BIOPSY Bilateral   . BREAST SURGERY    . CHOLECYSTECTOMY    . fibroids removed     breast (both breasts)  . IR IMAGING GUIDED PORT INSERTION  07/26/2019   right  . MASS EXCISION Right 06/26/2016   Procedure: EXCISION MUCOID TUMOR RIGHT THUMB, IP RIGHT THUMB;  Surgeon: Daryll Brod, MD;  Location: [facility];  Service: Orthopedics;  Laterality: Right;  . PARTIAL HYSTERECTOMY    . ROBOTIC ASSISTED BILATERAL SALPINGO OOPHERECTOMY N/A 01/03/2020   Procedure: XI ROBOTIC ASSISTED BILATERAL SALPINGO OOPHORECTOMY, RADICAL TUMOR DEBULKING;  Surgeon: Everitt Amber, MD;  Location: [facility];  Service: Gynecology;  Laterality: N/A;  . ROBOTIC PELVIC AND PARA-AORTIC LYMPH NODE DISSECTION N/A 01/03/2020   Procedure: XI ROBOTIC PARA-AORTIC LYMPHADENECTOMY;  Surgeon: Everitt Amber, MD;  Location: [facility];  Service: Gynecology;  Laterality: N/A;    SOCIAL HISTORY:  Social History   Socioeconomic History  . Marital status: Divorced    Spouse name: Not on file  . Number of children: 4  . Years of education: 38  . Highest education level: Not on file  Occupational History  . Occupation: retired Geographical information systems officer  Tobacco Use  . Smoking status: Never Smoker  . Smokeless tobacco: Never Used  Vaping  Use  . Vaping Use: Never used  Substance and Sexual Activity  . Alcohol use: No    Alcohol/week: 0.0 standard drinks  . Drug use: No  . Sexual activity: Not Currently  Other Topics Concern  . Not on file  Social History Narrative  . Not on file   Social Determinants of Health   Financial Resource Strain: Low Risk   . Difficulty of Paying Living Expenses: Not hard at all  Food Insecurity: No Food Insecurity  . Worried About Charity fundraiser in the Last Year: Never true  . Ran Out of Food in the Last Year: Never true  Transportation Needs: No Transportation Needs  . Lack of Transportation (Medical):  No  . Lack of Transportation (Non-Medical): No  Physical Activity: Inactive  . Days of Exercise per Week: 0 days  . Minutes of Exercise per Session: 0 min  Stress: No Stress Concern Present  . Feeling of Stress : Not at all  Social Connections: Moderately Integrated  . Frequency of Communication with Friends and Family: More than three times a week  . Frequency of Social Gatherings with Friends and Family: More than three times a week  . Attends Religious Services: More than 4 times per year  . Active Member of Clubs or Organizations: Yes  . Attends Archivist Meetings: Never  . Marital Status: Divorced  Human resources officer Violence: Not At Risk  . Fear of Current or Ex-Partner: No  . Emotionally Abused: No  . Physically Abused: No  . Sexually Abused: No    FAMILY HISTORY:  Family History  Problem Relation Age of Onset  . Hypertension Other   . Diabetes Father   . Hypertension Father   . Diabetes Sister   . Hypertension Mother   . Hypertension Sister   . Stroke Sister   . Diabetes Maternal Aunt   . Thyroid cancer Daughter 87  . Colon cancer Neg Hx   . Esophageal cancer Neg Hx   . Stomach cancer Neg Hx   . Rectal cancer Neg Hx   . Breast cancer Neg Hx   . Endometrial cancer Neg Hx   . Ovarian cancer Neg Hx     CURRENT MEDICATIONS:  Current Outpatient Medications  Medication Sig Dispense Refill  . Vibegron 75 MG TABS Take 75 mg by mouth daily. 30 tablet 2  . acetaminophen (TYLENOL) 650 MG CR tablet Take 650 mg by mouth every 8 (eight) hours as needed for pain.    Marland Kitchen amLODipine (NORVASC) 5 MG tablet Take 1 tablet (5 mg total) by mouth daily. 90 tablet 3  . Blood Glucose Monitoring Suppl (ONE TOUCH ULTRA 2) w/Device KIT Use as directed 1 each 0  . Camphor-Eucalyptus-Menthol (VICKS VAPORUB EX) Apply 1 application topically.    . Cholecalciferol (VITAMIN D3) 125 MCG (5000 UT) TABS Take 10,000-15,000 Units by mouth daily.    Marland Kitchen docusate sodium (COLACE) 100 MG capsule  Take 100-200 mg by mouth 2 (two) times daily as needed (constipation.).     Marland Kitchen famotidine (PEPCID) 20 MG tablet Take 20 mg by mouth 2 (two) times daily as needed for heartburn or indigestion.    . feeding supplement (BOOST HIGH PROTEIN) LIQD Take 237 mLs by mouth daily.    Marland Kitchen glimepiride (AMARYL) 2 MG tablet TAKE ONE TABLET BY MOUTH DAILY (Patient taking differently: Take 2 mg by mouth daily with breakfast.) 90 tablet 3  . Lancets (ONETOUCH ULTRASOFT) lancets USE TO CHECK BLOOD SUGAR TWICE DAILY  100 each 3  . loperamide (IMODIUM) 2 MG capsule Take 2 mg by mouth as needed for diarrhea or loose stools.     Marland Kitchen loratadine (CLARITIN) 10 MG tablet Take 10 mg by mouth daily as needed (allergies.).     Marland Kitchen LORazepam (ATIVAN) 0.5 MG tablet Take 1 tablet (0.5 mg total) by mouth 2 (two) times daily as needed for anxiety. (Patient not taking: Reported on 04/09/2020) 60 tablet 2  . ONETOUCH ULTRA test strip USE TO CHECK BLOOD SUGARS TWO TIMES DAILY 100 strip 3  . OVER THE COUNTER MEDICATION Apply 1 application topically daily as needed (pain). Triderma otc pain cream    . prochlorperazine (COMPAZINE) 10 MG tablet Take 10 mg by mouth every 6 (six) hours as needed for nausea or vomiting. (Patient not taking: Reported on 04/09/2020)    . solifenacin (VESICARE) 5 MG tablet Take 1 tablet (5 mg total) by mouth daily. (Patient taking differently: Take 5 mg by mouth daily as needed (bladder pain/spasms.).) 90 tablet 3  . traMADol (ULTRAM) 50 MG tablet Take 50 mg by mouth 2 (two) times daily as needed (ankle pain.). (Patient not taking: Reported on 04/09/2020)    . vitamin B-12 (CYANOCOBALAMIN) 1000 MCG tablet Take 1,000 mcg by mouth daily.      No current facility-administered medications for this visit.   Facility-Administered Medications Ordered in Other Visits  Medication Dose Route Frequency Provider Last Rate Last Admin  . heparin lock flush 100 unit/mL  500 Units Intravenous Once Derek Jack, MD      . sodium  chloride flush (NS) 0.9 % injection 10 mL  10 mL Intravenous Once Derek Jack, MD        ALLERGIES:  Allergies  Allergen Reactions  . Ciprofloxacin Swelling    Torn tendon  . Hydrocodone-Homatropine Other (See Comments)    Vertigo *pt strongly prefers to never take*  . Sulfa Antibiotics Hives, Itching and Swelling    Tongue swells  . Augmentin [Amoxicillin-Pot Clavulanate]     Diarrhea; can take PCN/ Amoxicillin  . Codeine Itching  . Crestor [Rosuvastatin Calcium] Other (See Comments)    Did something to memory    . Doxycycline Other (See Comments)    Severe rectal Gas.  . Gabapentin     disoriented  . Keflex [Cephalexin] Diarrhea and Nausea And Vomiting  . Naproxen Other (See Comments)    Stomach cramps  . Statins     Muscle weakness  . Prednisone Anxiety    *pt strongly prefers to never be given prednisone*     PHYSICAL EXAM:  Performance status (ECOG): 1 - Symptomatic but completely ambulatory  Vitals:   04/09/20 1306  BP: (!) 150/74  Pulse: 82  Resp: 18  Temp: (!) 97 F (36.1 C)  SpO2: 100%   Wt Readings from Last 3 Encounters:  04/09/20 187 lb 4.8 oz (85 kg)  03/19/20 187 lb (84.8 kg)  03/06/20 185 lb 6.4 oz (84.1 kg)   Physical Exam Vitals reviewed.  Constitutional:      Appearance: Normal appearance. She is obese.  Cardiovascular:     Rate and Rhythm: Normal rate and regular rhythm.     Pulses: Normal pulses.     Heart sounds: Normal heart sounds.  Pulmonary:     Effort: Pulmonary effort is normal.     Breath sounds: Normal breath sounds.  Abdominal:     Palpations: Abdomen is soft. There is no hepatomegaly or mass.     Tenderness: There is  no abdominal tenderness.     Hernia: No hernia is present.  Musculoskeletal:     Right lower leg: No edema.     Left lower leg: No edema.  Neurological:     General: No focal deficit present.     Mental Status: She is alert and oriented to person, place, and time.  Psychiatric:        Mood and  Affect: Mood normal.        Behavior: Behavior normal.      LABORATORY DATA:  I have reviewed the labs as listed.  CBC Latest Ref Rng & Units 04/09/2020 02/28/2020 02/09/2020  WBC 4.0 - 10.5 K/uL 6.3 7.8 6.2  Hemoglobin 12.0 - 15.0 g/dL 11.5(L) 12.4 12.0  Hematocrit 36.0 - 46.0 % 34.6(L) 36.3 36.5  Platelets 150 - 400 K/uL 187 196.0 192   CMP Latest Ref Rng & Units 04/09/2020 02/28/2020 02/09/2020  Glucose 70 - 99 mg/dL 151(H) 86 206(H)  BUN 8 - 23 mg/dL 20 18 19   Creatinine 0.44 - 1.00 mg/dL 0.99 0.90 1.07(H)  Sodium 135 - 145 mmol/L 134(L) 141 135  Potassium 3.5 - 5.1 mmol/L 3.6 4.2 4.0  Chloride 98 - 111 mmol/L 104 106 105  CO2 22 - 32 mmol/L 23 28 22   Calcium 8.9 - 10.3 mg/dL 9.6 10.7(H) 9.7  Total Protein 6.5 - 8.1 g/dL 7.3 7.8 7.3  Total Bilirubin 0.3 - 1.2 mg/dL 0.2(L) 0.3 0.5  Alkaline Phos 38 - 126 U/L 58 58 54  AST 15 - 41 U/L 16 15 16   ALT 0 - 44 U/L 13 11 14     DIAGNOSTIC IMAGING:  I have independently reviewed the scans and discussed with the patient. No results found.   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 Mass measures 6 x 5.8 cm and is partially calcified. Separate solid component superior to this in the left periaortic region measures 3.5 cm, SUV 14.3. Cystic component medial to the lower pole of the left kidney is without hypermetabolic activity, possibly a lymphocele. -CT-guided biopsy of the right retroperitoneal lymph node consistent with adenocarcinoma with psammoma bodies. Morphology and immunotherapy consistent with ovarian serous carcinoma/primary peritoneal carcinoma. -Germline mutation testing was negative. -Foundation 1 testing shows MS-stable. Loss of heterozygosity was less than 16%. -She met with Dr. Denman George who recommended neoadjuvant chemotherapywith 3-6 cycles of carboplatin and paclitaxel with  interim staging after 3 cycles. If there is substantial response unless encasement of the aorta and vena cava, consideration for debulking procedure can be possible at that time. -CA-125 346 on 06/23/2019. -Cycle 1 of carboplatin and paclitaxel on 07/28/2019. -CTAP on 9/21/2021after 3 cycles of chemotherapy showed retroperitoneal adenopathy at the bifurcation measuring 5.2 x 2.9 cm, left para-aortic lymphadenopathy measuring 5 x 4.4 cm, both of them decreased in size when compared to most recent PET scan. CT scan report says that one of the lesion has gotten bigger but this was compared to CT scan from 04/02/2019. -PET scan on December 05, 2019 shows 4.8 x 4.2 cm fluid filled density in the left para-aortic region, non-FDG avid favoring benign retroperitoneal cyst versus lymphangioma. Right/anterior para-aortic mixed cystic/solid lesions measuring 2.4 x 4.8 cm, SUV 2.5, previous SUV 19.5. -Robotic assisted laparoscopic total hysterectomy and bilateral salpingo-oophorectomy by Dr. Denman George on 01/03/2020. -Pathology showed soft tissue deformities 2 sites and psammomatous calcifications and chronic inflammation with no malignancy identified in the para-aortic  lymph node.  Right salpingo-oophorectomy showed microscopic focus of residual adenocarcinoma less than 1 mm.  No malignancy in the left ovary.  [id number]. -As she had prior difficulty with chemotherapy, no further chemotherapy after surgery was recommended.   PLAN:  1. Ovarian serous carcinoma: -She does report feeling tired easily after doing housework.  Appetite is fair. -Examination did not reveal any palpable masses. -Reviewed labs from today which showed normal LFTs.  CBC was grossly normal.  CA-125 from 02/09/2020 was normal.  CA-125 from today is pending. -RTC in 2 months with CTAP.  2. Low back pain: -She is not requiring any pain medication at this time.  3. Constipation: -She is not taking Senokot.  She is having bowel movement  every 2 days because of constipation. -She was told to start Senokot every day to every other day.  4. Hypertension: -Continue amlodipine daily.  Blood pressure is 150/74.  5. Leg swelling: -She is not requiring Lasix at this time.  6. Difficulty sleeping: -She will continue lorazepam as needed.  7.  Overactive bladder: -Vesicare is not helping.  She will discontinue it. -She used Myrbetriq which caused leg swellings. -We will start her on Mobic around 75 mg daily.   Orders placed this encounter:  No orders of the defined types were placed in this encounter.    Derek Jack, MD [place] [phone]   I, Milinda Antis, am acting as a scribe for Dr. Sanda Linger.  I, Derek Jack MD, have reviewed the above documentation for accuracy and completeness, and I agree with the above.

## 2020-04-09 NOTE — Progress Notes (Signed)
Laura [name] presented for Portacath access and flush.  Portacath located right chest wall accessed with  H 20 needle.  Good blood return present. Portacath flushed with 72ml NS and 500U/47ml Heparin and needle removed intact.  Procedure tolerated well and without incident.  Pt stable during and after port flush.  Discharged in stable condition ambulatory.

## 2020-04-09 NOTE — Patient Instructions (Signed)
[place] at [facility]  Discharge Instructions:  Port flush with labs.  Follow up as scheduled. Please call the clinic if you have any questions or concerns.  _______________________________________________________________  Thank you for choosing [place] at [facility] to provide your oncology and hematology care.  To afford each patient quality time with our providers, please arrive at least 15 minutes before your scheduled appointment.  You need to re-schedule your appointment if you arrive 10 or more minutes late.  We strive to give you quality time with our providers, and arriving late affects you and other patients whose appointments are after yours.  Also, if you no show three or more times for appointments you may be dismissed from the clinic.  Again, thank you for choosing [place] at [facility] hope is that these requests will allow you access to exceptional care and in a timely manner. _______________________________________________________________  If you have questions after your visit, please contact our office at [phone] between the hours of 8:30 a.m. and 5:00 p.m. Voicemails left after 4:30 p.m. will not be returned until the following business day. _______________________________________________________________  For prescription refill requests, have your pharmacy contact our office. _______________________________________________________________  Recommendations made by the consultant and any test results will be sent to your referring physician. _______________________________________________________________

## 2020-04-09 NOTE — Patient Instructions (Addendum)
[place] at [facility] Discharge Instructions  You were seen today by Dr. Delton Coombes. He went over your recent results and scans. Take Senakot every other day for your constipation. You will be prescribed vibegron 75 mg to take daily for your overactive bladder; stop taking Vesicare. Keep physically active to improve your strength. You will be scheduled to have a CT scan of your abdomen done before your next visit. Dr. Delton Coombes will see you back in 2 months for labs and follow up.   Thank you for choosing [place] at [facility] to provide your oncology and hematology care.  To afford each patient quality time with our provider, please arrive at least 15 minutes before your scheduled appointment time.   If you have a lab appointment with the [facility] please come in thru the Main Entrance and check in at the main information desk  You need to re-schedule your appointment should you arrive 10 or more minutes late.  We strive to give you quality time with our providers, and arriving late affects you and other patients whose appointments are after yours.  Also, if you no show three or more times for appointments you may be dismissed from the clinic at the providers discretion.     Again, thank you for choosing [facility].  Our hope is that these requests will decrease the amount of time that you wait before being seen by our physicians.       _____________________________________________________________  Should you have questions after your visit to [facility], please contact our office at [phone] between the hours of 8:00 a.m. and 4:30 p.m.  Voicemails left after 4:00 p.m. will not be returned until the following business day.  For prescription refill requests, have your pharmacy contact our office and allow 72 hours.    Cancer Center Support Programs:   > Cancer Support Group  2nd Tuesday of the month  1pm-2pm, Journey Room

## 2020-04-10 LAB — CA 125: Cancer Antigen (CA) 125: 30.1 U/mL (ref 0.0–38.1)

## 2020-04-12 ENCOUNTER — Other Ambulatory Visit (HOSPITAL_COMMUNITY): Payer: Self-pay

## 2020-04-16 ENCOUNTER — Other Ambulatory Visit (HOSPITAL_COMMUNITY): Payer: Self-pay

## 2020-04-16 MED FILL — Vibegron Tab 75 MG: ORAL | 30 days supply | Qty: 30 | Fill #0 | Status: CN

## 2020-04-17 ENCOUNTER — Encounter (HOSPITAL_COMMUNITY): Payer: Self-pay

## 2020-04-17 ENCOUNTER — Other Ambulatory Visit (HOSPITAL_COMMUNITY): Payer: Self-pay

## 2020-04-18 NOTE — Progress Notes (Signed)
[place] Patient and [facility] Counseling Note  The patient reported being more withdrawn again, but it not being as bad as before. The patient described not wanting to talk to people, get out of bed, and feeling weak. The counselor asked a process question of what it was like to be talking with the counselor now. The patient's goal is to not have to "talk herself into getting up." The patient also reported not sleeping well. The counselor used open ended questions to see what had helped the patient in the past. The counselor and patient also discussed an article that patient saw on Facebook about the effects of anti-depressant medication. The counselor provided psychoeducation. The patient is frustrated with her self not feeling better and the counselor offered her perspective that the patient was unable to process her experience of cancer beforehand and is still stuck in her emotions. The counselor introduced journaling. Throughout session, counselor provided active listening and reflections. The patient was oriented times three and in a powerless mood. Her affect was somewhat flat and she showed no SI/HI/NSSI. Thoughts of "What if my cancer comes back?" or "What if I push my loved ones away?"bring up fear and anxiety, which result in the patient not wanting to do anything (I.e. frozen in fear). Next session will discuss journaling and begin to process emotions.    Gaylyn Rong Counseling Intern

## 2020-04-24 ENCOUNTER — Other Ambulatory Visit (HOSPITAL_COMMUNITY): Payer: Self-pay

## 2020-04-25 ENCOUNTER — Other Ambulatory Visit: Payer: Self-pay

## 2020-04-25 ENCOUNTER — Inpatient Hospital Stay: Payer: Medicare Other | Attending: Gynecologic Oncology | Admitting: Gynecologic Oncology

## 2020-04-25 ENCOUNTER — Inpatient Hospital Stay: Payer: Medicare Other

## 2020-04-25 ENCOUNTER — Encounter: Payer: Self-pay | Admitting: Gynecologic Oncology

## 2020-04-25 ENCOUNTER — Other Ambulatory Visit (HOSPITAL_COMMUNITY): Payer: Self-pay

## 2020-04-25 ENCOUNTER — Encounter: Payer: Self-pay | Admitting: Oncology

## 2020-04-25 VITALS — BP 149/74 | HR 93 | Temp 97.4°F | Resp 20 | Wt 188.0 lb

## 2020-04-25 DIAGNOSIS — R011 Cardiac murmur, unspecified: Secondary | ICD-10-CM | POA: Diagnosis not present

## 2020-04-25 DIAGNOSIS — Z90722 Acquired absence of ovaries, bilateral: Secondary | ICD-10-CM | POA: Diagnosis not present

## 2020-04-25 DIAGNOSIS — Z7984 Long term (current) use of oral hypoglycemic drugs: Secondary | ICD-10-CM | POA: Insufficient documentation

## 2020-04-25 DIAGNOSIS — E1136 Type 2 diabetes mellitus with diabetic cataract: Secondary | ICD-10-CM | POA: Diagnosis not present

## 2020-04-25 DIAGNOSIS — Z9221 Personal history of antineoplastic chemotherapy: Secondary | ICD-10-CM | POA: Diagnosis not present

## 2020-04-25 DIAGNOSIS — R350 Frequency of micturition: Secondary | ICD-10-CM | POA: Insufficient documentation

## 2020-04-25 DIAGNOSIS — I10 Essential (primary) hypertension: Secondary | ICD-10-CM | POA: Insufficient documentation

## 2020-04-25 DIAGNOSIS — R971 Elevated cancer antigen 125 [CA 125]: Secondary | ICD-10-CM | POA: Insufficient documentation

## 2020-04-25 DIAGNOSIS — C561 Malignant neoplasm of right ovary: Secondary | ICD-10-CM | POA: Diagnosis present

## 2020-04-25 DIAGNOSIS — C569 Malignant neoplasm of unspecified ovary: Secondary | ICD-10-CM

## 2020-04-25 DIAGNOSIS — Z79899 Other long term (current) drug therapy: Secondary | ICD-10-CM | POA: Diagnosis not present

## 2020-04-25 DIAGNOSIS — E785 Hyperlipidemia, unspecified: Secondary | ICD-10-CM | POA: Diagnosis not present

## 2020-04-25 MED ORDER — IRBESARTAN 150 MG PO TABS
150.0000 mg | ORAL_TABLET | Freq: Every day | ORAL | 3 refills | Status: DC
  Filled 2020-04-25: qty 90, 90d supply, fill #0

## 2020-04-25 MED FILL — Vibegron Tab 75 MG: ORAL | 30 days supply | Qty: 30 | Fill #0 | Status: AC

## 2020-04-25 NOTE — Patient Instructions (Signed)
Dr Denman George wants to check a CT scan now to see if there's something causing your bladder symptoms that can be seen on CT scan.  She wants your CA 125 checked in 6 weeks.  She will see you again in 3 months.  If the CT scan shows cancer is regrowing, she will help you get in to see a doctor who prescribed chemotherapy and is in network.

## 2020-04-25 NOTE — Progress Notes (Signed)
Follow-up Note: Gyn-Onc  Consult was requested by Dr. Katragadda for the evaluation of Laura [name] 74 y.o. female  CC:  Chief Complaint  Patient presents with  . Primary ovarian adenocarcinoma, unspecified laterality (HCC)    Assessment/Plan:  Laura [name]  is a 74 y.o.  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 This was treated with neoadjuvant chemotherapy, surgery in December, 2021 showing complete pathologic response. Carboplatin and paclitaxel x 6 completed on 11/17/19.   Elevation in CA 125 and pelvic symptoms concerning for occult recurrence. We will schedule her for CT scan to evaluate for recurrence.  If negative, I recommend repeat evaluation of CA 125 in 6 weeks. If positive for recurrence, I recommend returning to see a medical oncologist to discuss salvage therapy for platinum resistant recurrent high grade ovarian cancer. Dr Katragadda is now out of network and we will need to establish care elsewhere unfortunately.  Presuming negative CT scan and repeat CA 125 in 6 weeks, I will see her again in 3 months for surveillance and CA 125 check.   HPI: Laura [name] is a 75 year old P3 who was seen in consultation at the request of Dr Katragadda for evaluation of stage IIIC high grade serous carcinoma of presumed ovary or fallopian tube.   The patient's history began in March, 2021 when she began experiencing flank and abdominal pains. These were evaluated at [facility] with a CT abd/pelvis which showed mixed solid and cystic lesions within the retroperitoneum centered predominantly in the aortocaval interval and tracking into the right iliac chain.  The largest solid component appeared to measure 5.9 x 4.3 x 6.4 cm.  To the left of the aorta was an additional lesion measuring approximately 4 x 3.6 x 4.6 cm.  The lesions partially encased to the abdominal aorta as well as the IMA  origin, proximal right renal artery, and right common iliac.  There was compression of the IVC with loss of clearly discernible fat plane and a small segment concerning for possible intraluminal extension.  The uterus was surgically absent and there were no concerning adnexal masses seen.  No obvious distant metastatic disease was identified.  There was severe right hydroureter ureter nephrosis to the level of the mid ureter.  This was likely secondary to occlusion by the retroperitoneal node masses.  There was moderate left hydronephrosis possibly secondary to a distended urinary bladder versus compression from the left nodal mass.  She subsequently was seen and evaluated by oncologist, Dr Katragadda, who performed a PET scan on May 17, 2019.  This revealed the previously demonstrated retroperitoneal masses which were hypermetabolic consistent with malignancy.  There is probable associated partial obstruction of the right mid ureter.  There was no hypermetabolic activity within the neck or chest.  There was no hypermetabolic areas within the pelvis.  An IR guided lymph node biopsy was performed on Jun 03, 2019 and revealed adenocarcinoma with some antibodies.  The carcinoma had high-grade features with histology consistent with a possible serous carcinoma.  Immunostains were positive for cytokeratin 7, p53, PAX 8, and weak focal positivity for ER.  This immunostain profile was consistent with a gynecologic primary.  The patient was initially disinterested in chemotherapy and desired surgical resection, not understanding that both, and certainly chemotherapy, would be necessary for curative intent treatment. After counseling, she agreed to neoadjuvant chemotherapy after understanding that complete surgical resection for cure   would not be possible given the extensive distribution of her metastatic nodal disease.  She went on to receive 6 cycles of neoadjuvant carboplatin paclitaxel chemotherapy as she was  deemed to not have surgically resectable disease at initial presentation due to the bulky and likely invasive nature of her retroperitoneal adenopathy.  She struggled somewhat with neoadjuvant chemotherapy requiring dose modifications.  After cycle 3 was administered she underwent a repeat CT scan of the abdomen and pelvis performed on October 04, 2019.  This revealed that the retroperitoneal lymphadenopathy in the lower aortocaval space at the level of the bifurcations had decreased enhancement and a decrease in size measuring 5.2 x 2.9 cm (compared to 6 x 4.3 cm previously).  There was low-attenuation lymphadenopathy in the left parotid region with mild increase in size and index node measuring 5 x 4.4 cm which had slightly increased from 4.5 to 3.7 cm previously.  There were no new sites of lymphadenopathy identified.  Ca1 25 was drawn on October 06, 2019 which was day 1 of cycle 4.  This had normalized to 21.3 (prior to treatment in June 2021 this value was 346).  She continued chemotherapy because her nodal disease was not felt to be amenable to complete resection after 3 cycles, receiving her 6th cycle on November 17, 2019.  The patient reported that the cycle was particularly difficult to tolerate.  She had substantial fatigue and weakness following the cycle of chemotherapy.  On November 17, 2019 which was day 1 of cycle 6 her Ca1 25 was normal at 15.2.  (It had normalized in September, 2021).  PET restaging on December 06, 2019 showed improving periotic lymphadenopathy with the right para-aortic nodal conglomerate now measuring 2.4 x 4.8 cm in aggregate (it had previously been 2.6 x 5).  There was a very small region of FDG avidity within that area but mostly necrosis.  The area on the left parotic region was 4.8 x 4.2 cm and favored a benign retroperitoneal cyst versus lymphangioma as it had been completely stable throughout therapy with no FDG uptake.  On 01/03/2020 she underwent robotic  assisted BSO, para-aortic node debulking. Intraoperative findings were significant for a bulky cystic necrotic nodal complex overlying the aortocavum at the sacral promontory and infra-mesenteric aorta. There was a cystic left para-aortic structure that was filled with amber fluid and after drainage of the cyst there was no structure to resect. The entire right/central aortocaval nodal mass was resected with no gross residual disease remaining. There was a normal appearing left ovary, a right ovary that was mildly nodular and adherent to the right ovarian fossa (both removed). She had a surgical absent uterus. There was a normal appearing omentum and no gross peritoneal disease. At the completion of the surgery there was no gross visible residual tumor remaining. Surgery was uncomplicated.  Final pathology revealed a high grade serous carcinoma of the right ovary (comprising <36m focus). The left tube and ovary had no residual carcinoma. There was no carcinoma identified in the lymph node excision.  Interval Hx:  She did not receive additional chemotherapy postoperatively given the complete pathologic response noted from surgical specimens and given the toxicity associated with her neoadjuvant chemotherapy. CA 125 on 02/09/20 was 17.8.  Surveillance CA 125 on 04/09/20 was 30.1. At that time she reported urinary frequency and pelvic pressure. She'd had 3 urine analyses/cultures that had been negative.     Current Meds:  Outpatient Encounter Medications as of 04/25/2020  Medication Sig  . acetaminophen (  TYLENOL) 650 MG CR tablet Take 650 mg by mouth every 8 (eight) hours as needed for pain.  . amLODipine (NORVASC) 5 MG tablet TAKE 1 TABLET (5 MG TOTAL) BY MOUTH DAILY.  . Blood Glucose Monitoring Suppl (ONE TOUCH ULTRA 2) w/Device KIT Use as directed  . Camphor-Eucalyptus-Menthol (VICKS VAPORUB EX) Apply 1 application topically.  . Cholecalciferol (VITAMIN D3) 125 MCG (5000 UT) TABS Take 10,000-15,000  Units by mouth daily.  . famotidine (PEPCID) 20 MG tablet Take 20 mg by mouth 2 (two) times daily as needed for heartburn or indigestion.  . feeding supplement (BOOST HIGH PROTEIN) LIQD Take 237 mLs by mouth daily.  . glimepiride (AMARYL) 2 MG tablet TAKE ONE TABLET BY MOUTH DAILY (Patient taking differently: Take 2 mg by mouth daily with breakfast.)  . Lancets (ONETOUCH ULTRASOFT) lancets USE TO CHECK BLOOD SUGAR TWICE DAILY  . loperamide (IMODIUM) 2 MG capsule Take 2 mg by mouth as needed for diarrhea or loose stools.   . loratadine (CLARITIN) 10 MG tablet Take 10 mg by mouth daily as needed (allergies.).   . ONETOUCH ULTRA test strip USE TO CHECK BLOOD SUGARS TWO TIMES DAILY  . OVER THE COUNTER MEDICATION Apply 1 application topically daily as needed (pain). Triderma otc pain cream  . solifenacin (VESICARE) 5 MG tablet TAKE 1 TABLET (5 MG TOTAL) BY MOUTH DAILY. (Patient taking differently: Take 5 mg by mouth daily as needed (bladder pain/spasms.).)  . traMADol (ULTRAM) 50 MG tablet Take 50 mg by mouth 2 (two) times daily as needed (ankle pain.).  . vitamin B-12 (CYANOCOBALAMIN) 1000 MCG tablet Take 1,000 mcg by mouth daily.   . docusate sodium (COLACE) 100 MG capsule Take 100-200 mg by mouth 2 (two) times daily as needed (constipation.).  (Patient not taking: Reported on 04/24/2020)  . LORazepam (ATIVAN) 0.5 MG tablet TAKE 1 TABLET BY MOUTH 2 TIMES DAILY AS NEEDED FOR ANXIETY. (Patient not taking: No sig reported)  . prochlorperazine (COMPAZINE) 10 MG tablet Take 10 mg by mouth every 6 (six) hours as needed for nausea or vomiting. (Patient not taking: No sig reported)  . Vibegron 75 MG TABS TAKE 1 TABLET BY MOUTH DAILY. (Patient not taking: Reported on 04/24/2020)  . [DISCONTINUED] enoxaparin (LOVENOX) 40 MG/0.4ML injection Inject 0.4 mLs (40 mg total) into the skin daily for 14 days. For AFTER surgery only  . [DISCONTINUED] eszopiclone (LUNESTA) 2 MG TABS tablet Take 1 tablet (2 mg total) by mouth  at bedtime as needed for sleep. Take immediately before bedtime  . [DISCONTINUED] irbesartan (AVAPRO) 150 MG tablet Take 1 tablet (150 mg total) by mouth daily. (Patient not taking: Reported on 03/19/2020)  . [DISCONTINUED] mirtazapine (REMERON) 15 MG tablet Take 1 tablet (15 mg total) by mouth at bedtime. (Patient not taking: Reported on 03/19/2020)   No facility-administered encounter medications on file as of 04/25/2020.    Allergy:  Allergies  Allergen Reactions  . Ciprofloxacin Swelling    Torn tendon  . Hydrocodone-Homatropine Other (See Comments)    Vertigo *pt strongly prefers to never take*  . Sulfa Antibiotics Hives, Itching and Swelling    Tongue swells  . Augmentin [Amoxicillin-Pot Clavulanate]     Diarrhea; can take PCN/ Amoxicillin  . Codeine Itching  . Crestor [Rosuvastatin Calcium] Other (See Comments)    Did something to memory    . Doxycycline Other (See Comments)    Severe rectal Gas.  . Gabapentin     disoriented  . Keflex [Cephalexin] Diarrhea and Nausea   And Vomiting  . Naproxen Other (See Comments)    Stomach cramps  . Statins     Muscle weakness  . Prednisone Anxiety    *pt strongly prefers to never be given prednisone*     Social Hx:   Social History   Socioeconomic History  . Marital status: Divorced    Spouse name: Not on file  . Number of children: 4  . Years of education: 43  . Highest education level: Not on file  Occupational History  . Occupation: retired Geographical information systems officer  Tobacco Use  . Smoking status: Never Smoker  . Smokeless tobacco: Never Used  Vaping Use  . Vaping Use: Never used  Substance and Sexual Activity  . Alcohol use: No    Alcohol/week: 0.0 standard drinks  . Drug use: No  . Sexual activity: Not Currently  Other Topics Concern  . Not on file  Social History Narrative  . Not on file   Social Determinants of Health   Financial Resource Strain: Not on file  Food Insecurity: Not on file  Transportation  Needs: Not on file  Physical Activity: Not on file  Stress: Not on file  Social Connections: Not on file  Intimate Partner Violence: Not on file    Past Surgical Hx:  Past Surgical History:  Procedure Laterality Date  . ABDOMINAL HYSTERECTOMY     vaginal  . BLADDER SURGERY    . BREAST EXCISIONAL BIOPSY Bilateral   . BREAST SURGERY    . CHOLECYSTECTOMY    . fibroids removed     breast (both breasts)  . IR IMAGING GUIDED PORT INSERTION  07/26/2019   right  . MASS EXCISION Right 06/26/2016   Procedure: EXCISION MUCOID TUMOR RIGHT THUMB, IP RIGHT THUMB;  Surgeon: Daryll Brod, MD;  Location: [facility];  Service: Orthopedics;  Laterality: Right;  . PARTIAL HYSTERECTOMY    . ROBOTIC ASSISTED BILATERAL SALPINGO OOPHERECTOMY N/A 01/03/2020   Procedure: XI ROBOTIC ASSISTED BILATERAL SALPINGO OOPHORECTOMY, RADICAL TUMOR DEBULKING;  Surgeon: Everitt Amber, MD;  Location: [facility];  Service: Gynecology;  Laterality: N/A;  . ROBOTIC PELVIC AND PARA-AORTIC LYMPH NODE DISSECTION N/A 01/03/2020   Procedure: XI ROBOTIC PARA-AORTIC LYMPHADENECTOMY;  Surgeon: Everitt Amber, MD;  Location: [facility];  Service: Gynecology;  Laterality: N/A;    Past Medical Hx:  Past Medical History:  Diagnosis Date  . Anemia   . Anxiety   . Arthritis    back of neck, bones spurs on neck  . Cancer ([place])    ovarian cancer  . Cataract   . Cervical disc disease   . Diabetes mellitus   . Family history of thyroid cancer   . Heart murmur   . Hyperlipidemia   . Hypertension   . Mucoid cyst of joint    right thumb  . Neuropathy   . Port-A-Cath in place 07/21/2019  . Reflux   . Sleep apnea    wears CPAP nightly  . Vertigo     Past Gynecological History:  See HPI No LMP recorded. Patient has had a hysterectomy.  Family Hx:  Family History  Problem Relation Age of Onset  . Hypertension Other   . Diabetes Father   . Hypertension Father   . Diabetes Sister   . Hypertension Mother   . Hypertension  Sister   . Stroke Sister   . Diabetes Maternal Aunt   . Thyroid cancer Daughter 70  . Colon cancer Neg Hx   . Esophageal cancer  Neg Hx   . Stomach cancer Neg Hx   . Rectal cancer Neg Hx   . Breast cancer Neg Hx   . Endometrial cancer Neg Hx   . Ovarian cancer Neg Hx     Review of Systems:  Constitutional  + fatigue and weakness ENT Normal appearing ears and nares bilaterally Skin/Breast  No rash, sores, jaundice, itching, dryness Cardiovascular  No chest pain, shortness of breath, or edema  Pulmonary  No cough or wheeze.  Gastro Intestinal  No nausea, vomitting, or diarrhoea. No bright red blood per rectum, no abdominal pain, change in bowel movement, or constipation.  Genito Urinary  No frequency, urgency, dysuria, denies hematuria Musculo Skeletal  No myalgia, arthralgia, joint swelling or pain  Neurologic  No weakness, numbness, change in gait,  Psychology  No depression, anxiety, insomnia.   Vitals:  Blood pressure (!) 149/74, pulse 93, temperature (!) 97.4 F (36.3 C), temperature source Tympanic, resp. rate 20, weight 188 lb (85.3 kg), SpO2 100 %.  Physical Exam: WD in NAD Neck  Supple NROM, without any enlargements.  Lymph Node Survey No cervical supraclavicular or inguinal adenopathy Cardiovascular  Pulse normal rate, regularity and rhythm. S1 and S2 normal.  Lungs  Clear to auscultation bilateraly, without wheezes/crackles/rhonchi. Good air movement.  Skin  No rash/lesions/breakdown  Psychiatry  Alert and oriented to person, place, and time  Abdomen  Normoactive bowel sounds, abdomen soft, non-tender and obese without evidence of hernia. Incisions soft. Back No CVA tenderness Genito Urinary  No palpable pelvic masses Rectal  No palpable rectal masses Extremities  No bilateral cyanosis, clubbing or edema.  Thereasa Solo, MD  04/25/2020, 2:09 PM

## 2020-04-26 ENCOUNTER — Telehealth: Payer: Self-pay | Admitting: Oncology

## 2020-04-26 LAB — CA 125: Cancer Antigen (CA) 125: 39.5 U/mL — ABNORMAL HIGH (ref 0.0–38.1)

## 2020-04-26 NOTE — Progress Notes (Signed)
[facility] Patient and [facility] Counseling Note  The counselor and patient had an abbreviated session so patient could contact insurance providers so as not to less coverage for cancer tx. The patient shared a follow-up appointment indicated her cancer may be returning. Counselor and patient discussed emotions and thoughts surrounding that possibility. The patient compared her response now to her responses when she first was diagnosed.The counselor and patient discussed her new perspective on cancer tx and the counselor offered reframes of unhelpful perspectives that the patient expressed. The counselor provided active listening, psychoeducation, and reflections of emotion. The patient was oriented times three. Her mood was concerned with an appropriate to context affect. She showed no signs of SI/HI/NSSI. Thoughts of "What if my cancer comes back?" or "What if I push my loved ones away?"bring up fear and anxiety, which result in the patient not wanting to do anything (I.e. frozen in fear). Next session will discuss the patient's upcoming scan.    Gaylyn Rong Counseling Intern

## 2020-04-26 NOTE — Telephone Encounter (Signed)
[place] regarding her insurance.  She said she received a call last month from [facility] saying that Dr. Delton Coombes will be out of network at the end of March.  She is going to call [place] back to see who is in network. Discussed possibly being referred to [facility] and she said she would rather go to [facility] if any providers are in network.  She will call back once she finds out.

## 2020-05-01 ENCOUNTER — Ambulatory Visit (HOSPITAL_BASED_OUTPATIENT_CLINIC_OR_DEPARTMENT_OTHER)
Admission: RE | Admit: 2020-05-01 | Discharge: 2020-05-01 | Disposition: A | Discharge status: Home / Self Care | Payer: Medicare Other | Source: Ambulatory Visit | Attending: Hematology | Admitting: Hematology

## 2020-05-01 ENCOUNTER — Other Ambulatory Visit: Payer: Self-pay

## 2020-05-01 DIAGNOSIS — R590 Localized enlarged lymph nodes: Secondary | ICD-10-CM | POA: Insufficient documentation

## 2020-05-01 DIAGNOSIS — M4316 Spondylolisthesis, lumbar region: Secondary | ICD-10-CM | POA: Insufficient documentation

## 2020-05-01 DIAGNOSIS — C569 Malignant neoplasm of unspecified ovary: Secondary | ICD-10-CM | POA: Diagnosis not present

## 2020-05-01 DIAGNOSIS — M5136 Other intervertebral disc degeneration, lumbar region: Secondary | ICD-10-CM | POA: Insufficient documentation

## 2020-05-01 DIAGNOSIS — N8110 Cystocele, unspecified: Secondary | ICD-10-CM | POA: Insufficient documentation

## 2020-05-01 DIAGNOSIS — I700 Atherosclerosis of aorta: Secondary | ICD-10-CM | POA: Diagnosis not present

## 2020-05-01 DIAGNOSIS — K449 Diaphragmatic hernia without obstruction or gangrene: Secondary | ICD-10-CM | POA: Diagnosis not present

## 2020-05-01 MED ORDER — IOHEXOL 300 MG/ML  SOLN
80.0000 mL | Freq: Once | INTRAMUSCULAR | Status: AC | PRN
  Administered 2020-05-01: 80 mL via INTRAVENOUS

## 2020-05-02 NOTE — Progress Notes (Signed)
[place] Patient and [facility] Counseling Note  The patient and counselor had a brief check-in after patient received results from her CAT scan. She had been worried about but felt relieved to hear it came back clean. The patient also described interacting with someone with a new diagnosis and how that conversation felt. The patient described her strong aversion to hearing the word cancer, as well as gratitude in her life. The patient and counselor reflected on the changes the patient experienced in the past few months. The counselor provided active listening, reflections, and validation of feelings. The patient was oriented times three and in a happy mood, with an appropriate to context affect. She showed no signs of SI/HI/NSSI. Thoughts of "What if my cancer comes back?" or "What if I push my loved ones away?"bring up fear and anxiety, which result in the patient not wanting to do anything (I.e. frozen in fear).However, having confirmation from a scan that her cancer is not currently present brought happy thoughts and emotions for the patient. Next session will process the end of the counseling relationship.   Gaylyn Rong Counseling Intern

## 2020-05-03 ENCOUNTER — Encounter (HOSPITAL_COMMUNITY): Payer: Self-pay

## 2020-05-03 ENCOUNTER — Telehealth: Payer: Self-pay | Admitting: Oncology

## 2020-05-03 NOTE — Telephone Encounter (Signed)
Attempted to call [facility] x 2 regarding CT results.  Not able to leave a message.  Will try to reach her tomorrow.

## 2020-05-08 ENCOUNTER — Ambulatory Visit: Payer: Medicare Other

## 2020-05-08 NOTE — Telephone Encounter (Signed)
[facility] and reviewed CT Scan results.  Advised her that the enlarged lymph nodes in the pelvis are smaller and that there are no new sites of cancer seen.  Also advised her we will fax the results to her primary care doctor so that they are aware of the other findings.    Laura [name] also said that she talked to [employer] and they verified that Dr. Delton Coombes is in network.  Went over her upcoming appointments and advised her that we will call her with a follow up appointment with Dr. Denman George once her schedule is available.  CT results faxed successfully to Dr. Jenny Reichmann.

## 2020-05-09 ENCOUNTER — Other Ambulatory Visit (HOSPITAL_COMMUNITY): Payer: Self-pay

## 2020-05-09 MED FILL — Amlodipine Besylate Tab 5 MG (Base Equivalent): ORAL | 90 days supply | Qty: 90 | Fill #0 | Status: AC

## 2020-05-09 MED FILL — Glimepiride Tab 2 MG: ORAL | 90 days supply | Qty: 90 | Fill #0 | Status: AC

## 2020-05-10 ENCOUNTER — Other Ambulatory Visit (HOSPITAL_COMMUNITY): Payer: Self-pay

## 2020-05-10 NOTE — Progress Notes (Signed)
[place] Patient and [facility] Counseling Note  The patient and counselor met for a final, brief session. The patient expressed gratitude and engaged in reflections on treatment with the counselor. The counselor suggested that this experience has been a learning one for the patient. The patient and counselor also discussed fear and seeking help in the future. The counselor offered final reflections for the patient, active listening, and reflections of emotion. The patient was oriented times three and in a grateful mood. Her affect was appropriate to context and she showed no signs of SI/HI/NSSI. Thoughts of "What if my cancer comes back?" or "What if I push my loved ones away?"brought up fear and anxiety, which resulted in the patient not wanting to do anything (I.e. frozen in fear).However, having confirmation from a scan that her cancer is not currently present brought happy thoughts and emotions for the patient. Additionally, patient gained perspective and acceptance, limiting her negative thoughts and emotions.    Gaylyn Rong Counseling Intern

## 2020-05-16 ENCOUNTER — Ambulatory Visit: Payer: Self-pay

## 2020-05-16 ENCOUNTER — Ambulatory Visit (INDEPENDENT_AMBULATORY_CARE_PROVIDER_SITE_OTHER): Payer: Medicare Other | Admitting: Internal Medicine

## 2020-05-16 ENCOUNTER — Ambulatory Visit (INDEPENDENT_AMBULATORY_CARE_PROVIDER_SITE_OTHER): Payer: Medicare Other | Admitting: Specialist

## 2020-05-16 ENCOUNTER — Other Ambulatory Visit: Payer: Self-pay

## 2020-05-16 ENCOUNTER — Encounter: Payer: Self-pay | Admitting: Internal Medicine

## 2020-05-16 ENCOUNTER — Other Ambulatory Visit (HOSPITAL_COMMUNITY): Payer: Self-pay

## 2020-05-16 ENCOUNTER — Encounter: Payer: Self-pay | Admitting: Specialist

## 2020-05-16 VITALS — BP 149/80 | HR 90 | Ht 64.0 in | Wt 188.0 lb

## 2020-05-16 VITALS — BP 168/82 | HR 80 | Temp 99.2°F | Ht 64.0 in | Wt 192.0 lb

## 2020-05-16 DIAGNOSIS — E119 Type 2 diabetes mellitus without complications: Secondary | ICD-10-CM

## 2020-05-16 DIAGNOSIS — M7502 Adhesive capsulitis of left shoulder: Secondary | ICD-10-CM | POA: Diagnosis not present

## 2020-05-16 DIAGNOSIS — M25512 Pain in left shoulder: Secondary | ICD-10-CM | POA: Diagnosis not present

## 2020-05-16 DIAGNOSIS — K582 Mixed irritable bowel syndrome: Secondary | ICD-10-CM

## 2020-05-16 DIAGNOSIS — M79605 Pain in left leg: Secondary | ICD-10-CM | POA: Diagnosis not present

## 2020-05-16 DIAGNOSIS — I10 Essential (primary) hypertension: Secondary | ICD-10-CM | POA: Diagnosis not present

## 2020-05-16 DIAGNOSIS — G8929 Other chronic pain: Secondary | ICD-10-CM | POA: Diagnosis not present

## 2020-05-16 DIAGNOSIS — M47812 Spondylosis without myelopathy or radiculopathy, cervical region: Secondary | ICD-10-CM | POA: Diagnosis not present

## 2020-05-16 DIAGNOSIS — M542 Cervicalgia: Secondary | ICD-10-CM | POA: Diagnosis not present

## 2020-05-16 DIAGNOSIS — E559 Vitamin D deficiency, unspecified: Secondary | ICD-10-CM | POA: Diagnosis not present

## 2020-05-16 MED ORDER — IRBESARTAN 75 MG PO TABS
75.0000 mg | ORAL_TABLET | Freq: Every day | ORAL | 3 refills | Status: DC
  Filled 2020-05-16: qty 30, 30d supply, fill #0
  Filled 2020-06-18: qty 30, 30d supply, fill #1
  Filled 2020-07-20: qty 30, 30d supply, fill #2
  Filled 2020-08-22: qty 30, 30d supply, fill #3
  Filled 2020-08-23 (×2): qty 30, 30d supply, fill #0

## 2020-05-16 MED ORDER — LORAZEPAM 0.5 MG PO TABS
0.5000 mg | ORAL_TABLET | Freq: Two times a day (BID) | ORAL | 1 refills | Status: DC | PRN
  Filled 2020-05-16: qty 60, 30d supply, fill #0

## 2020-05-16 MED ORDER — ACETAMINOPHEN 500 MG PO TABS
500.0000 mg | ORAL_TABLET | Freq: Four times a day (QID) | ORAL | 0 refills | Status: AC | PRN
  Filled 2020-05-16: qty 30, 8d supply, fill #0

## 2020-05-16 NOTE — Patient Instructions (Addendum)
Please take all new medication as prescribed - the irbesartan 75 mg per day (low dose)  OK to take the OTC miralax and probiotics for the GI symptoms  OK to try the OTC Keyes patch for the left upper leg pain with sleeping at night to see if this helps  Sharon to take the Ativan as needed for stress  Please continue all other medications as before, and refills have been done if requested.  Please have the pharmacy call with any other refills you may need.  Please continue your efforts at being more active, low cholesterol diet, and weight control.  Please keep your appointments with your specialists as you may have planned

## 2020-05-16 NOTE — Progress Notes (Signed)
Patient ID: MERYL HUBERS, female   DOB: May 31, 1945, 75 y.o.   MRN: [id number]        Chief Complaint: diarrhea, weakness       HPI:  TAHISHA HAKIM is a 75 y.o. female here with c/o GI symptoms and high bP;  Only took 2-3 wks irbesartan 150 mg after start feb 2022 due to overall weakness; willing to try lower dose;  Pt denies chest pain, increased sob or doe, wheezing, orthopnea, PND, increased LE swelling, palpitations, or syncope.  Denies worsening depressive symptoms, suicidal ideation, or panic; has ongoing anxiety, asks for refills  Also with some fecal incontinence recently as part of her constipation and diarrhea and gas - has take the colace for stool softner but then has stool urgency so has to take the immodium but then ends up with constipation again.  Not taking the probiotic recently.  Also with c/o left lateral upper leg pain, mild to mod, x 2 wks, worse to lie on left side at night but no soreness to touch; denies lower back pain and no fever, wt loss,  worsening LE numbness/weakness, gait change or falls.          Wt Readings from Last 3 Encounters:  05/16/20 192 lb (87.1 kg)  05/16/20 188 lb (85.3 kg)  04/25/20 188 lb (85.3 kg)   BP Readings from Last 3 Encounters:  05/16/20 (!) 168/82  05/16/20 (!) 149/80  04/25/20 (!) 149/74         Past Medical History:  Diagnosis Date  . Anemia   . Anxiety   . Arthritis    back of neck, bones spurs on neck  . Cancer ([place])    ovarian cancer  . Cataract   . Cervical disc disease   . Diabetes mellitus   . Family history of thyroid cancer   . Heart murmur   . Hyperlipidemia   . Hypertension   . Mucoid cyst of joint    right thumb  . Neuropathy   . Port-A-Cath in place 07/21/2019  . Reflux   . Sleep apnea    wears CPAP nightly  . Vertigo    Past Surgical History:  Procedure Laterality Date  . ABDOMINAL HYSTERECTOMY     vaginal  . BLADDER SURGERY    . BREAST EXCISIONAL BIOPSY Bilateral   . BREAST SURGERY    .  CHOLECYSTECTOMY    . fibroids removed     breast (both breasts)  . IR IMAGING GUIDED PORT INSERTION  07/26/2019   right  . MASS EXCISION Right 06/26/2016   Procedure: EXCISION MUCOID TUMOR RIGHT THUMB, IP RIGHT THUMB;  Surgeon: Daryll Brod, MD;  Location: [facility];  Service: Orthopedics;  Laterality: Right;  . PARTIAL HYSTERECTOMY    . ROBOTIC ASSISTED BILATERAL SALPINGO OOPHERECTOMY N/A 01/03/2020   Procedure: XI ROBOTIC ASSISTED BILATERAL SALPINGO OOPHORECTOMY, RADICAL TUMOR DEBULKING;  Surgeon: Everitt Amber, MD;  Location[facility];  Service: Gynecology;  Laterality: N/A;  . ROBOTIC PELVIC AND PARA-AORTIC LYMPH NODE DISSECTION N/A 01/03/2020   Procedure: XI ROBOTIC PARA-AORTIC LYMPHADENECTOMY;  Surgeon: Everitt Amber, MD;  Location: [facility];  Service: Gynecology;  Laterality: N/A;    reports that she has never smoked. She has never used smokeless tobacco. She reports that she does not drink alcohol and does not use drugs. family history includes Diabetes in her father, maternal aunt, and sister; Hypertension in her father, mother, sister, and another family member; Stroke in her sister; Thyroid cancer (  age of onset: 20) in her daughter. Allergies  Allergen Reactions  . Ciprofloxacin Swelling    Torn tendon  . Hydrocodone Bit-Homatrop Mbr Other (See Comments)    Vertigo *pt strongly prefers to never take*  . Sulfa Antibiotics Hives, Itching and Swelling    Tongue swells  . Augmentin [Amoxicillin-Pot Clavulanate]     Diarrhea; can take PCN/ Amoxicillin  . Codeine Itching  . Crestor [Rosuvastatin Calcium] Other (See Comments)    Did something to memory    . Doxycycline Other (See Comments)    Severe rectal Gas.  . Gabapentin     disoriented  . Keflex [Cephalexin] Diarrhea and Nausea And Vomiting  . Naproxen Other (See Comments)    Stomach cramps  . Statins     Muscle weakness  . Prednisone Anxiety    *pt strongly prefers to never be given prednisone*    Current  Outpatient Medications on File Prior to Visit  Medication Sig Dispense Refill  . acetaminophen (TYLENOL) 650 MG CR tablet Take 650 mg by mouth every 8 (eight) hours as needed for pain.    Marland Kitchen amLODipine (NORVASC) 5 MG tablet TAKE 1 TABLET (5 MG TOTAL) BY MOUTH DAILY. 90 tablet 3  . Blood Glucose Monitoring Suppl (ONE TOUCH ULTRA 2) w/Device KIT Use as directed 1 each 0  . Camphor-Eucalyptus-Menthol (VICKS VAPORUB EX) Apply 1 application topically.    . Cholecalciferol (VITAMIN D3) 125 MCG (5000 UT) TABS Take 10,000-15,000 Units by mouth daily.    . famotidine (PEPCID) 20 MG tablet Take 20 mg by mouth 2 (two) times daily as needed for heartburn or indigestion.    . feeding supplement (BOOST HIGH PROTEIN) LIQD Take 237 mLs by mouth daily.    Marland Kitchen glimepiride (AMARYL) 2 MG tablet TAKE ONE TABLET BY MOUTH DAILY (Patient taking differently: Take 2 mg by mouth daily with breakfast.) 90 tablet 3  . Lancets (ONETOUCH ULTRASOFT) lancets USE TO CHECK BLOOD SUGAR TWICE DAILY 100 each 3  . loperamide (IMODIUM) 2 MG capsule Take 2 mg by mouth as needed for diarrhea or loose stools.     Marland Kitchen loratadine (CLARITIN) 10 MG tablet Take 10 mg by mouth daily as needed (allergies.).     Marland Kitchen ONETOUCH ULTRA test strip USE TO CHECK BLOOD SUGARS TWO TIMES DAILY 100 strip 3  . OVER THE COUNTER MEDICATION Apply 1 application topically daily as needed (pain). Triderma otc pain cream    . solifenacin (VESICARE) 5 MG tablet TAKE 1 TABLET (5 MG TOTAL) BY MOUTH DAILY. (Patient taking differently: Take 5 mg by mouth daily as needed (bladder pain/spasms.).) 90 tablet 3  . traMADol (ULTRAM) 50 MG tablet Take 50 mg by mouth 2 (two) times daily as needed (ankle pain.).    Marland Kitchen vitamin B-12 (CYANOCOBALAMIN) 1000 MCG tablet Take 1,000 mcg by mouth daily.     . [DISCONTINUED] enoxaparin (LOVENOX) 40 MG/0.4ML injection Inject 0.4 mLs (40 mg total) into the skin daily for 14 days. For AFTER surgery only 5.6 mL 0  . [DISCONTINUED] eszopiclone (LUNESTA)  2 MG TABS tablet Take 1 tablet (2 mg total) by mouth at bedtime as needed for sleep. Take immediately before bedtime 90 tablet 1   No current facility-administered medications on file prior to visit.        ROS:  All others reviewed and negative.  Objective        PE:  BP (!) 168/82 (BP Location: Left Arm, Patient Position: Sitting, Cuff Size: Large)   Pulse  80   Temp 99.2 F (37.3 C) (Oral)   Ht 5' 4" (1.626 m)   Wt 192 lb (87.1 kg)   SpO2 99%   BMI 32.96 kg/m                 Constitutional: Pt appears in NAD               HENT: Head: NCAT.                Right Ear: External ear normal.                 Left Ear: External ear normal.                Eyes: . Pupils are equal, round, and reactive to light. Conjunctivae and EOM are normal               Nose: without d/c or deformity               Neck: Neck supple. Gross normal ROM               Cardiovascular: Normal rate and regular rhythm.                 Pulmonary/Chest: Effort normal and breath sounds without rales or wheezing.                Abd:  Soft, NT, ND, + BS, no organomegaly               Neurological: Pt is alert. At baseline orientation, motor grossly intact               Skin: Skin is warm. No rashes, no other new lesions, LE edema - none               Psychiatric: Pt behavior is normal without agitation   Micro: none  Cardiac tracings I have personally interpreted today:  none  Pertinent Radiological findings (summarize): none   Lab Results  Component Value Date   WBC 6.3 04/09/2020   HGB 11.5 (L) 04/09/2020   HCT 34.6 (L) 04/09/2020   PLT 187 04/09/2020   GLUCOSE 151 (H) 04/09/2020   CHOL 270 (H) 02/28/2020   TRIG 286.0 (H) 02/28/2020   HDL 63.20 02/28/2020   LDLDIRECT 172.0 02/28/2020   LDLCALC 166 (H) 04/12/2014   ALT 13 04/09/2020   AST 16 04/09/2020   NA 134 (L) 04/09/2020   K 3.6 04/09/2020   CL 104 04/09/2020   CREATININE 0.99 04/09/2020   BUN 20 04/09/2020   CO2 23 04/09/2020   TSH 2.92  02/28/2020   INR 1.0 06/06/2019   HGBA1C 6.0 02/28/2020   MICROALBUR 1.4 02/28/2020   Assessment/Plan:  DERETHA ERTLE is a 75 y.o. Black or African American [2] female with  has a past medical history of Anemia, Anxiety, Arthritis, Cancer ([place]), Cataract, Cervical disc disease, Diabetes mellitus, Family history of thyroid cancer, Heart murmur, Hyperlipidemia, Hypertension, Mucoid cyst of joint, Neuropathy, Port-A-Cath in place (07/21/2019), Reflux, Sleep apnea, and Vertigo.  Irritable bowel syndrome D/w pt - ok for miralax and probiotic prn otc,  to f/u any worsening symptoms or concerns  Hypertension Uncontrolled, for low dose irbesartan 75 qd,  to f/u any worsening symptoms or concerns  Vitamin D deficiency Last vitamin D Lab Results  Component Value Date   VD25OH 51.43 02/28/2020   Stable, cont oral replacement   Diabetes mellitus type 2, noninsulin dependent ([place])  Lab Results  Component Value Date   HGBA1C 6.0 02/28/2020   Stable, pt to continue current medical treatment   - amaryl   Left leg pain Etiology unclear, suggested to try otc salon paz prn,  to f/u any worsening symptoms or concerns  Followup: Return in about 3 months (around 08/27/2020).  Cathlean Cower, MD 05/19/2020 9:20 PM [facility]

## 2020-05-16 NOTE — Patient Instructions (Addendum)
Avoid overhead lifting and overhead use of the arms. Do not lift greater than 5 lbs. Adjust head rest in vehicle to prevent hyperextension if rear ended. Take extra precautions to avoid falling.  Arthritis strength tylenol up to every 6 hours for pain one tablet only. No more than 6 tablets per day.  Referral to PT in [place] .  Biofreeze roll on gel is helpful in relieving pain. If no improvement then we may need to consider evaluation with MRI.

## 2020-05-16 NOTE — Progress Notes (Signed)
Office Visit Note   Patient: Laura [name]           Date of Birth: [birth year]           MRN: [id number] Visit Date: 05/16/2020              Requested by: Biagio Borg, MD [address] [place],   [id number] PCP: Biagio Borg, MD   Assessment & Plan: Visit Diagnoses:  1. Neck pain   2. Spondylosis without myelopathy or radiculopathy, cervical region   3. Chronic left shoulder pain   4. Adhesive capsulitis of left shoulder     Plan: Avoid overhead lifting and overhead use of the arms. Do not lift greater than 5 lbs. Adjust head rest in vehicle to prevent hyperextension if rear ended. Take extra precautions to avoid falling.  Arthritis strength tylenol up to every 6 hours for pain one tablet only. No more than 6 tablets per day.  Referral to PT in [place] .   Follow-Up Instructions: Return in about 4 weeks (around 06/13/2020).   Orders:  Orders Placed This Encounter  Procedures  . XR Cervical Spine 2 or 3 views  . Ambulatory referral to Physical Therapy   Meds ordered this encounter  Medications  . acetaminophen (TYLENOL) 500 MG tablet    Sig: Take 1 tablet (500 mg total) by mouth every 6 (six) hours as needed.    Dispense:  30 tablet    Refill:  0      Procedures: No procedures performed   Clinical Data: No additional findings.   Subjective: Chief Complaint  Patient presents with  . Neck - Pain  . Right Shoulder - Pain  . Left Shoulder - Pain    75 year old female with pain into the posterior neck and the back of the skull and there is pain into the bilateral neck and shoulder to the outer upper arm. She has taken chemotherapy and has had oopherectomy and fallopian tubes for ovarian Ca. She is reportedly cancer free. She is having pain in the posterior neck at night and continuously. No pain with upward gaze. Mainly at the base of the skull posteriorly.    Review of Systems  Constitutional: Positive for unexpected weight change (lost 20 lbs and  has gained 5 lbs back.). Negative for activity change, appetite change, chills, diaphoresis, fatigue (Feels tired, ) and fever.  HENT: Positive for trouble swallowing (cracking sounds in the jaw with chewing, nt able to sleep on her back. ).   Respiratory: Negative.   Genitourinary: Negative.   Musculoskeletal: Positive for neck pain and neck stiffness.  Allergic/Immunologic: Negative for environmental allergies.  Neurological: Positive for weakness and numbness.  Hematological: Negative.      Objective: Vital Signs: BP (!) 149/80 (BP Location: Left Arm, Patient Position: Sitting)   Pulse 90   Ht 5\' 4"  (1.626 m)   Wt 188 lb (85.3 kg)   BMI 32.27 kg/m   Physical Exam Constitutional:      Appearance: She is well-developed.  HENT:     Head: Normocephalic and atraumatic.  Eyes:     Pupils: Pupils are equal, round, and reactive to light.  Pulmonary:     Effort: Pulmonary effort is normal.     Breath sounds: Normal breath sounds.  Abdominal:     General: Bowel sounds are normal.     Palpations: Abdomen is soft.  Musculoskeletal:        General: Normal range of  motion.     Cervical back: Normal range of motion and neck supple.  Skin:    General: Skin is warm and dry.  Neurological:     Mental Status: She is alert and oriented to person, place, and time.  Psychiatric:        Behavior: Behavior normal.        Thought Content: Thought content normal.        Judgment: Judgment normal.     Ortho Exam  Specialty Comments:  No specialty comments available.  Imaging: No results found.   PMFS History: Patient Active Problem List   Diagnosis Date Noted  . Statin myopathy 02/27/2020  . Ovarian cancer ([place]) 01/03/2020  . 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 Nontraumatic tear of right tibialis posterior tendon 12/14/2019  . Right ankle pain 12/07/2019  . Right ankle tendonitis 11/11/2019  . Aortic atherosclerosis ([place]) 10/12/2019   . Dehydration 10/06/2019  . Port-A-Cath in place 07/21/2019  . Genetic testing 07/15/2019  . Elevated CA-125 07/07/2019  . Family history of thyroid cancer   . Malignant neoplasm of both ovaries 06/20/2019  . Paronychia, finger, left 04/15/2019  . Vaginitis 04/15/2019  . Abdominal pain 04/04/2019  . Retroperitoneal mass 04/03/2019  . Acute gouty arthritis 12/17/2018  . Bilateral hearing loss 12/21/2017  . Eustachian tube disorder, bilateral 12/21/2017  . Cough 02/23/2017  . Left hand pain 05/26/2016  . Mucoid cyst, joint 05/26/2016  . Pain in finger of right hand 05/26/2016  . Primary osteoarthritis of both first carpometacarpal joints 05/26/2016  . Skin avulsion 05/26/2016  . Adhesive capsulitis of left shoulder 03/20/2016  . Fatigue 12/16/2015  . Acute bronchitis 10/31/2015  . Urinary frequency 03/09/2015  . Hearing loss, sensorineural, asymmetrical 11/09/2014  . Vestibular hypofunction, left 11/09/2014  . Obesity 07/25/2014  . Orthostatic hypotension 07/07/2014  . Upper airway cough syndrome 06/26/2014  . Diarrhea 05/02/2014  . Dizziness 04/12/2014  . Neuropathy   . Rash and nonspecific skin eruption 08/30/2013  . Paresthesia of both feet 08/30/2013  . Blurred vision 02/15/2013  . Eye pain 02/15/2013  . Sinusitis, chronic 10/07/2012  . Left shoulder pain 04/15/2012  . Encounter for well adult exam with abnormal findings 10/22/2011  . Cervical disc disease 10/22/2011  . Bilateral hand pain 08/05/2011  . SNHL (sensorineural hearing loss) 08/04/2011  . Fibrocystic breast disease 07/02/2011  . Hypertension 07/01/2011  . GERD (gastroesophageal reflux disease) 06/29/2011  . Bloating 06/29/2011  . Back pain 03/13/2011  . Vertigo 01/04/2011  . Nystagmus 01/04/2011  . Insomnia 10/21/2010  . Other constipation 08/17/2009  . Vitamin D deficiency 05/15/2009  . DYSPNEA 03/13/2009  . Depression with anxiety 01/16/2009  . Irritable bowel syndrome 01/16/2009  . Primary  osteoarthritis of both hands 12/15/2008  . Diabetes mellitus type 2, noninsulin dependent ([place]) 11/03/2008  . Hyperlipidemia 11/03/2008  . Anxiety state 11/03/2008  . OVERACTIVE BLADDER 11/03/2008  . OSA (obstructive sleep apnea) 11/03/2008  . MURMUR 11/03/2008   Past Medical History:  Diagnosis Date  . Anemia   . Anxiety   . Arthritis    back of neck, bones spurs on neck  . Cancer ([place])    ovarian cancer  . Cataract   . Cervical disc disease   . Diabetes mellitus   . Family history of thyroid cancer   . Heart murmur   . Hyperlipidemia   . Hypertension   . Mucoid cyst of joint    right thumb  .  Neuropathy   . Port-A-Cath in place 07/21/2019  . Reflux   . Sleep apnea    wears CPAP nightly  . Vertigo     Family History  Problem Relation Age of Onset  . Hypertension Other   . Diabetes Father   . Hypertension Father   . Diabetes Sister   . Hypertension Mother   . Hypertension Sister   . Stroke Sister   . Diabetes Maternal Aunt   . Thyroid cancer Daughter 43  . Colon cancer Neg Hx   . Esophageal cancer Neg Hx   . Stomach cancer Neg Hx   . Rectal cancer Neg Hx   . Breast cancer Neg Hx   . Endometrial cancer Neg Hx   . Ovarian cancer Neg Hx     Past Surgical History:  Procedure Laterality Date  . ABDOMINAL HYSTERECTOMY     vaginal  . BLADDER SURGERY    . BREAST EXCISIONAL BIOPSY Bilateral   . BREAST SURGERY    . CHOLECYSTECTOMY    . fibroids removed     breast (both breasts)  . IR IMAGING GUIDED PORT INSERTION  07/26/2019   right  . MASS EXCISION Right 06/26/2016   Procedure: EXCISION MUCOID TUMOR RIGHT THUMB, IP RIGHT THUMB;  Surgeon: Daryll Brod, MD;  Location: [facility];  Service: Orthopedics;  Laterality: Right;  . PARTIAL HYSTERECTOMY    . ROBOTIC ASSISTED BILATERAL SALPINGO OOPHERECTOMY N/A 01/03/2020   Procedure: XI ROBOTIC ASSISTED BILATERAL SALPINGO OOPHORECTOMY, RADICAL TUMOR DEBULKING;  Surgeon: Everitt Amber, MD;  [facility];   Service: Gynecology;  Laterality: N/A;  . ROBOTIC PELVIC AND PARA-AORTIC LYMPH NODE DISSECTION N/A 01/03/2020   Procedure: XI ROBOTIC PARA-AORTIC LYMPHADENECTOMY;  Surgeon: Everitt Amber, MD;  Location[facility];  Service: Gynecology;  Laterality: N/A;   Social History   Occupational History  . Occupation: retired Geographical information systems officer  Tobacco Use  . Smoking status: Never Smoker  . Smokeless tobacco: Never Used  Vaping Use  . Vaping Use: Never used  Substance and Sexual Activity  . Alcohol use: No    Alcohol/week: 0.0 standard drinks  . Drug use: No  . Sexual activity: Not Currently

## 2020-05-17 ENCOUNTER — Other Ambulatory Visit (HOSPITAL_COMMUNITY): Payer: Self-pay

## 2020-05-18 ENCOUNTER — Telehealth: Payer: Self-pay | Admitting: Specialist

## 2020-05-18 NOTE — Telephone Encounter (Signed)
Pls advise.  

## 2020-05-18 NOTE — Telephone Encounter (Signed)
Patient called requesting tramadol. Please send to pharmacy on file. Patient phone number is [phone].

## 2020-05-19 ENCOUNTER — Encounter: Payer: Self-pay | Admitting: Internal Medicine

## 2020-05-19 DIAGNOSIS — M79605 Pain in left leg: Secondary | ICD-10-CM | POA: Insufficient documentation

## 2020-05-19 NOTE — Assessment & Plan Note (Signed)
Last vitamin D Lab Results  Component Value Date   VD25OH 51.43 02/28/2020   Stable, cont oral replacement

## 2020-05-19 NOTE — Assessment & Plan Note (Signed)
Lab Results  Component Value Date   HGBA1C 6.0 02/28/2020   Stable, pt to continue current medical treatment   - amaryl

## 2020-05-19 NOTE — Assessment & Plan Note (Signed)
Etiology unclear, suggested to try otc salon paz prn,  to f/u any worsening symptoms or concerns

## 2020-05-19 NOTE — Assessment & Plan Note (Signed)
Uncontrolled, for low dose irbesartan 75 qd,  to f/u any worsening symptoms or concerns

## 2020-05-19 NOTE — Assessment & Plan Note (Signed)
D/w pt - ok for miralax and probiotic prn otc,  to f/u any worsening symptoms or concerns

## 2020-05-21 ENCOUNTER — Telehealth: Payer: Self-pay | Admitting: *Deleted

## 2020-05-21 ENCOUNTER — Other Ambulatory Visit (HOSPITAL_COMMUNITY): Payer: Self-pay

## 2020-05-21 DIAGNOSIS — C569 Malignant neoplasm of unspecified ovary: Secondary | ICD-10-CM

## 2020-05-21 NOTE — Telephone Encounter (Signed)
Patient called and stated " had surgery on 12/21 / of last year with Dr Denman George. She removed my tubes and ovaries. I am having pain now for several weeks. The pain all the way across the abdomen and around my back. The pain is like muscle spasms. The pain comes and goes, but last for hours when it is going on. At times the pain is also in my neck and shoulders. Nothing makes the pain better, tylenol helps a little. When I have the pain is it a 6-7. The pain is cramping dull stabbing pain with the muscle spasm feeling. I am having some bladder pain; I have no burning or odor; but do feel like I'm not emptying my bladder. I do have some problems with constipation but that is an on going issue." Explained that the message would be given to [facility] and someone would call her back later today.

## 2020-05-21 NOTE — Telephone Encounter (Signed)
[facility] and advised her that Dr. Denman George is recommending a CT scan and checking a CA 125 level.  Laura [name] said she would like to have the scan done at [facility].   Called her back and advised her of the CT scan appointment on 05/30/20 at 11:30 and instructions for the contrast.  She will pick up the contrast today.  Also advised her of port.lab appointment at 10:00 before the CT scan.  She verbalized understanding and agreement.

## 2020-05-23 ENCOUNTER — Other Ambulatory Visit: Payer: Self-pay | Admitting: Specialist

## 2020-05-23 MED ORDER — TRAMADOL HCL 50 MG PO TABS
50.0000 mg | ORAL_TABLET | Freq: Two times a day (BID) | ORAL | 0 refills | Status: DC | PRN
  Filled 2020-05-23: qty 14, 7d supply, fill #0

## 2020-05-23 NOTE — Telephone Encounter (Signed)
Rx sent to pharmacy   

## 2020-05-24 ENCOUNTER — Encounter: Payer: Self-pay | Admitting: Gynecologic Oncology

## 2020-05-24 ENCOUNTER — Other Ambulatory Visit (HOSPITAL_COMMUNITY): Payer: Self-pay

## 2020-05-25 ENCOUNTER — Telehealth: Payer: Self-pay | Admitting: *Deleted

## 2020-05-25 ENCOUNTER — Other Ambulatory Visit: Payer: Self-pay

## 2020-05-25 ENCOUNTER — Ambulatory Visit: Payer: Medicare Other | Attending: Specialist | Admitting: Physical Therapy

## 2020-05-25 DIAGNOSIS — M25612 Stiffness of left shoulder, not elsewhere classified: Secondary | ICD-10-CM | POA: Diagnosis not present

## 2020-05-25 DIAGNOSIS — M542 Cervicalgia: Secondary | ICD-10-CM | POA: Insufficient documentation

## 2020-05-25 DIAGNOSIS — G8929 Other chronic pain: Secondary | ICD-10-CM | POA: Insufficient documentation

## 2020-05-25 DIAGNOSIS — M25512 Pain in left shoulder: Secondary | ICD-10-CM | POA: Diagnosis not present

## 2020-05-25 NOTE — Telephone Encounter (Signed)
Called the patient regarding her my chart message; patient asked to CT instructions. Explained that she would drink her first bottle of contrast at 9:30 am and the second at 10:30 am. Also explained that she can't have anything by mouth after 7:30 am. Patient given her lab appt at 10 am, prior to her CT scan time at 11:30 am.

## 2020-05-25 NOTE — Therapy (Signed)
[facility] [place], [place], [address] Phone: [phone]   Fax:  [phone]  Physical Therapy Evaluation  Patient Details  Name: Laura [name] MRN: [id number] Date of Birth: 01-06-1946 Referring Provider (PT): Basil Dess MD   Encounter Date: 05/25/2020   PT End of Session - 05/25/20 1332    Visit Number 1    Number of Visits 12    Date for PT Re-Evaluation 08/23/20    Authorization Type FOTO AT LEAST EVERY 5TH VISIT.  PROGRESS NOTE AT 10TH VISIT.  KX MODIFIER AFTER 15 VISITS.    PT Start Time [phone]    PT Stop Time 1046    PT Time Calculation (min) 48 min    Activity Tolerance Patient tolerated treatment well    Behavior During Therapy WFL for tasks assessed/performed           Past Medical History:  Diagnosis Date  . Anemia   . Anxiety   . Arthritis    back of neck, bones spurs on neck  . Cancer ([place])    ovarian cancer  . Cataract   . Cervical disc disease   . Diabetes mellitus   . Family history of thyroid cancer   . Heart murmur   . Hyperlipidemia   . Hypertension   . Mucoid cyst of joint    right thumb  . Neuropathy   . Port-A-Cath in place 07/21/2019  . Reflux   . Sleep apnea    wears CPAP nightly  . Vertigo     Past Surgical History:  Procedure Laterality Date  . ABDOMINAL HYSTERECTOMY     vaginal  . BLADDER SURGERY    . BREAST EXCISIONAL BIOPSY Bilateral   . BREAST SURGERY    . CHOLECYSTECTOMY    . fibroids removed     breast (both breasts)  . IR IMAGING GUIDED PORT INSERTION  07/26/2019   right  . MASS EXCISION Right 06/26/2016   Procedure: EXCISION MUCOID TUMOR RIGHT THUMB, IP RIGHT THUMB;  Surgeon: Daryll Brod, MD;  Location: [facility];  Service: Orthopedics;  Laterality: Right;  . PARTIAL HYSTERECTOMY    . ROBOTIC ASSISTED BILATERAL SALPINGO OOPHERECTOMY N/A 01/03/2020   Procedure: XI ROBOTIC ASSISTED BILATERAL SALPINGO OOPHORECTOMY, RADICAL TUMOR DEBULKING;   Surgeon: Everitt Amber, MD;  Location: [facility];  Service: Gynecology;  Laterality: N/A;  . ROBOTIC PELVIC AND PARA-AORTIC LYMPH NODE DISSECTION N/A 01/03/2020   Procedure: XI ROBOTIC PARA-AORTIC LYMPHADENECTOMY;  Surgeon: Everitt Amber, MD;  Location: [facility];  Service: Gynecology;  Laterality: N/A;    There were no vitals filed for this visit.    Subjective Assessment - 05/25/20 1345    Subjective COVID-19 screen performed prior to patient entering clinic.  The patient presents to the clinic today with c/o neck and left shoulder pain and loss of motion.  her pain at rest today is a 6/10 but can rise to higher levels when trying to sleep.  Heat and Tylenol decrease pain.    Pertinent History Ovarian cancer ("cancer-free".), DM, neuropathy, cervical disc disease, OA, HTN.    How long can you sit comfortably? Varies.    Patient Stated Goals Reduce pain and get more motion in left shoulder.  Become more active.    Currently in Pain? Yes    Pain Score 6     Pain Location Neck   Left shoulder.   Pain Descriptors / Indicators Aching;Sharp;Shooting    Pain Type Chronic pain  Pain Onset More than a month ago    Pain Frequency Constant    Aggravating Factors  See above.    Pain Relieving Factors See above.              [facility] PT Assessment - 05/25/20 0001      Assessment   Medical Diagnosis Cervicalgia, left frozen shoulder.    Referring Provider (PT) Basil Dess MD    Onset Date/Surgical Date --   Ongoing.     Precautions   Precaution Comments Recent ovarian cancer.      Restrictions   Weight Bearing Restrictions No      Balance Screen   Has the patient fallen in the past 6 months No    Has the patient had a decrease in activity level because of a fear of falling?  No    Is the patient reluctant to leave their home because of a fear of falling?  No      Home Environment   Living Environment Private residence      Prior Function   Level of Independence Independent       Posture/Postural Control   Posture/Postural Control Postural limitations    Postural Limitations Rounded Shoulders;Forward head      Deep Tendon Reflexes   DTR Assessment Site --   Absent left Biceps DTR.     ROM / Strength   AROM / PROM / Strength AROM;Strength      AROM   Overall AROM Comments Active left shoulder flexion to 90 degrees and ER to 20 degrees.  Bilateral active cervical rotation is limited to 45 degrees.      Strength   Overall Strength Comments Left shoulder strength 4/5 for major muscle groups.      Palpation   Palpation comment Diffuse bilateral cervical tenderness over suboccipital, paraspinal and UT musculature.      Ambulation/Gait   Gait Comments slow but essentially WNL.                      Objective measurements completed on examination: See above findings.       [facility] Adult PT Treatment/Exercise - 05/25/20 0001      Modalities   Modalities Traction      Traction   Type of Traction Cervical    Min (lbs) 5    Max (lbs) 10    Hold Time 99    Rest Time 5    Time 10                       PT Long Term Goals - 05/25/20 1406      PT LONG TERM GOAL #1   Title Perform ADL's with pain not > 3/10.    Time 6    Status New      PT LONG TERM GOAL #2   Title Active left shoulder flexion to 125 degrees so the patient can easily reach overhead    Time 6    Period Weeks    Status New      PT LONG TERM GOAL #3   Title Left shoulder ER to 55 degrees.    Time 6    Period Weeks    Status New      PT LONG TERM GOAL #4   Title Bilateral active cervical rotation to 60 degrees+.    Time 6    Period Weeks    Status New  Plan - 05/25/20 1332    Clinical Impression Statement The patient presents to [facility] with c/o neck and left shoulder pain.  Her cervical and left shoulder motion is decreased significantly.  She has diffuse bilateral cervical musculature.  Pain prohibits her from doing all her ADL's s  she would like to.  Her left Bicep DTR is absent.  Left shoulder strength is generally 4/5.  Patient will benefit from skilled physical therapy intervention to address pain and deficits.    Personal Factors and Comorbidities Comorbidity 1;Other    Comorbidities Ovarian cancer ("cancer-free".), DM, neuropathy, cervical disc disease, OA, HTN.    Examination-Activity Limitations Other;Reach Overhead    Examination-Participation Restrictions Other    Stability/Clinical Decision Making Evolving/Moderate complexity    Clinical Decision Making Low    Rehab Potential Good    PT Frequency 2x / week    PT Duration 6 weeks    PT Treatment/Interventions ADLs/Self Care Home Management;Cryotherapy;Traction;Moist Heat;Functional mobility training;Therapeutic activities;Therapeutic exercise;Manual techniques;Patient/family education;Passive range of motion    PT Next Visit Plan FOTO...Marland KitchenMarland KitchenPer MD:  cervical traction up to 10#, left shoulder PROM, STW/M.  Progress to UE ranger and pulleys.    Consulted and Agree with Plan of Care Patient           Patient will benefit from skilled therapeutic intervention in order to improve the following deficits and impairments:  Pain,Decreased activity tolerance,Decreased range of motion,Decreased strength,Increased muscle spasms,Postural dysfunction  Visit Diagnosis: Cervicalgia - Plan: PT plan of care cert/re-cert  Chronic left shoulder pain - Plan: PT plan of care cert/re-cert  Stiffness of left shoulder, not elsewhere classified - Plan: PT plan of care cert/re-cert     Problem List Patient Active Problem List   Diagnosis Date Noted  . Left leg pain 05/19/2020  . Statin myopathy 02/27/2020  . Ovarian cancer ([facility]) 01/03/2020  . 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 Nontraumatic tear of right tibialis posterior tendon 12/14/2019  . Right ankle pain 12/07/2019  . Right ankle tendonitis 11/11/2019  . Aortic  atherosclerosis ([facility]) 10/12/2019  . Dehydration 10/06/2019  . Port-A-Cath in place 07/21/2019  . Genetic testing 07/15/2019  . Elevated CA-125 07/07/2019  . Family history of thyroid cancer   . Malignant neoplasm of both ovaries 06/20/2019  . Paronychia, finger, left 04/15/2019  . Vaginitis 04/15/2019  . Abdominal pain 04/04/2019  . Retroperitoneal mass 04/03/2019  . Acute gouty arthritis 12/17/2018  . Bilateral hearing loss 12/21/2017  . Eustachian tube disorder, bilateral 12/21/2017  . Cough 02/23/2017  . Left hand pain 05/26/2016  . Mucoid cyst, joint 05/26/2016  . Pain in finger of right hand 05/26/2016  . Primary osteoarthritis of both first carpometacarpal joints 05/26/2016  . Skin avulsion 05/26/2016  . Adhesive capsulitis of left shoulder 03/20/2016  . Fatigue 12/16/2015  . Acute bronchitis 10/31/2015  . Urinary frequency 03/09/2015  . Hearing loss, sensorineural, asymmetrical 11/09/2014  . Vestibular hypofunction, left 11/09/2014  . Obesity 07/25/2014  . Orthostatic hypotension 07/07/2014  . Upper airway cough syndrome 06/26/2014  . Diarrhea 05/02/2014  . Dizziness 04/12/2014  . Neuropathy   . Rash and nonspecific skin eruption 08/30/2013  . Paresthesia of both feet 08/30/2013  . Blurred vision 02/15/2013  . Eye pain 02/15/2013  . Sinusitis, chronic 10/07/2012  . Left shoulder pain 04/15/2012  . Encounter for well adult exam with abnormal findings 10/22/2011  . Cervical disc disease 10/22/2011  . Bilateral hand pain 08/05/2011  . SNHL (sensorineural hearing loss)  08/04/2011  . Fibrocystic breast disease 07/02/2011  . Hypertension 07/01/2011  . GERD (gastroesophageal reflux disease) 06/29/2011  . Bloating 06/29/2011  . Back pain 03/13/2011  . Vertigo 01/04/2011  . Nystagmus 01/04/2011  . Insomnia 10/21/2010  . Other constipation 08/17/2009  . Vitamin D deficiency 05/15/2009  . DYSPNEA 03/13/2009  . Depression with anxiety 01/16/2009  . Irritable bowel  syndrome 01/16/2009  . Primary osteoarthritis of both hands 12/15/2008  . Diabetes mellitus type 2, noninsulin dependent ([place]) 11/03/2008  . Hyperlipidemia 11/03/2008  . Anxiety state 11/03/2008  . OVERACTIVE BLADDER 11/03/2008  . OSA (obstructive sleep apnea) 11/03/2008  . MURMUR 11/03/2008    Adekunle Rohrbach, [place] MPT 05/25/2020, 2:08 PM  [facility] [address] [place], [place], [address] Phone: [phone]   Fax:  [phone]  Name: Laura [name] MRN: [id number] Date of Birth: 26-Nov-1945

## 2020-05-30 ENCOUNTER — Inpatient Hospital Stay: Payer: Medicare Other | Attending: Gynecologic Oncology

## 2020-05-30 ENCOUNTER — Other Ambulatory Visit: Payer: Self-pay

## 2020-05-30 ENCOUNTER — Telehealth: Payer: Self-pay | Admitting: Oncology

## 2020-05-30 ENCOUNTER — Ambulatory Visit (HOSPITAL_COMMUNITY)
Admission: RE | Admit: 2020-05-30 | Discharge: 2020-05-30 | Disposition: A | Discharge status: Home / Self Care | Payer: Medicare Other | Source: Ambulatory Visit | Attending: Gynecologic Oncology | Admitting: Gynecologic Oncology

## 2020-05-30 ENCOUNTER — Encounter: Payer: Self-pay | Admitting: Gynecologic Oncology

## 2020-05-30 DIAGNOSIS — Z90710 Acquired absence of both cervix and uterus: Secondary | ICD-10-CM | POA: Insufficient documentation

## 2020-05-30 DIAGNOSIS — K449 Diaphragmatic hernia without obstruction or gangrene: Secondary | ICD-10-CM | POA: Diagnosis not present

## 2020-05-30 DIAGNOSIS — Z9079 Acquired absence of other genital organ(s): Secondary | ICD-10-CM | POA: Insufficient documentation

## 2020-05-30 DIAGNOSIS — Z9221 Personal history of antineoplastic chemotherapy: Secondary | ICD-10-CM | POA: Insufficient documentation

## 2020-05-30 DIAGNOSIS — C569 Malignant neoplasm of unspecified ovary: Secondary | ICD-10-CM

## 2020-05-30 DIAGNOSIS — R590 Localized enlarged lymph nodes: Secondary | ICD-10-CM | POA: Insufficient documentation

## 2020-05-30 DIAGNOSIS — R109 Unspecified abdominal pain: Secondary | ICD-10-CM | POA: Diagnosis not present

## 2020-05-30 DIAGNOSIS — Z8543 Personal history of malignant neoplasm of ovary: Secondary | ICD-10-CM | POA: Insufficient documentation

## 2020-05-30 DIAGNOSIS — I700 Atherosclerosis of aorta: Secondary | ICD-10-CM | POA: Diagnosis not present

## 2020-05-30 DIAGNOSIS — Z90722 Acquired absence of ovaries, bilateral: Secondary | ICD-10-CM | POA: Insufficient documentation

## 2020-05-30 DIAGNOSIS — E860 Dehydration: Secondary | ICD-10-CM

## 2020-05-30 DIAGNOSIS — N8110 Cystocele, unspecified: Secondary | ICD-10-CM | POA: Insufficient documentation

## 2020-05-30 DIAGNOSIS — I2510 Atherosclerotic heart disease of native coronary artery without angina pectoris: Secondary | ICD-10-CM | POA: Insufficient documentation

## 2020-05-30 LAB — BASIC METABOLIC PANEL - CANCER CENTER ONLY
Anion gap: 11 (ref 5–15)
BUN: 19 mg/dL (ref 8–23)
CO2: 23 mmol/L (ref 22–32)
Calcium: 9.9 mg/dL (ref 8.9–10.3)
Chloride: 106 mmol/L (ref 98–111)
Creatinine: 0.98 mg/dL (ref 0.44–1.00)
GFR, Estimated: >60 mL/min (ref >60)
Glucose, Bld: 120 mg/dL — ABNORMAL HIGH (ref 70–99)
Potassium: 4.5 mmol/L (ref 3.5–5.1)
Sodium: 140 mmol/L (ref 135–145)

## 2020-05-30 MED ORDER — HEPARIN SOD (PORK) LOCK FLUSH 100 UNIT/ML IV SOLN: 500.0000 [IU] | Freq: Once | INTRAVENOUS | Status: DC

## 2020-05-30 MED ORDER — SODIUM CHLORIDE 0.9% FLUSH
10.0000 mL | Freq: Once | INTRAVENOUS | Status: AC | PRN
  Administered 2020-05-30: 10 mL
  Filled 2020-05-30: qty 10

## 2020-05-30 MED ORDER — HEPARIN SOD (PORK) LOCK FLUSH 100 UNIT/ML IV SOLN
INTRAVENOUS | Status: AC
  Administered 2020-05-30: 500 [IU]
  Filled 2020-05-30: qty 5

## 2020-05-30 NOTE — Telephone Encounter (Signed)
[facility] and let her know the CA 125 test is still in process.  Advised her that we will call her once the results are back.  She verbalized understanding.

## 2020-05-31 ENCOUNTER — Ambulatory Visit: Payer: Medicare Other

## 2020-05-31 ENCOUNTER — Ambulatory Visit: Payer: Medicare Other | Admitting: *Deleted

## 2020-05-31 ENCOUNTER — Other Ambulatory Visit: Payer: Self-pay

## 2020-05-31 DIAGNOSIS — G8929 Other chronic pain: Secondary | ICD-10-CM | POA: Diagnosis not present

## 2020-05-31 DIAGNOSIS — M25612 Stiffness of left shoulder, not elsewhere classified: Secondary | ICD-10-CM | POA: Diagnosis not present

## 2020-05-31 DIAGNOSIS — M542 Cervicalgia: Secondary | ICD-10-CM

## 2020-05-31 DIAGNOSIS — M25512 Pain in left shoulder: Secondary | ICD-10-CM | POA: Diagnosis not present

## 2020-05-31 LAB — CA 125: Cancer Antigen (CA) 125: 58.9 U/mL — ABNORMAL HIGH (ref 0.0–38.1)

## 2020-05-31 NOTE — Therapy (Signed)
[facility] [place], [place], [address] Phone: [phone]   Fax:  [phone]  Physical Therapy Treatment  Patient Details  Name: Laura [name] MRN: [id number] Date of Birth: [birth year] Referring Provider (PT): Basil Dess MD   Encounter Date: 05/31/2020   PT End of Session - 05/31/20 1553    Visit Number 2    Number of Visits 12    Date for PT Re-Evaluation 08/23/20    Authorization Type FOTO AT LEAST EVERY 5TH VISIT.  PROGRESS NOTE AT 10TH VISIT.  KX MODIFIER AFTER 15 VISITS.    PT Start Time 1515    PT Stop Time 1405    PT Time Calculation (min) 1370 min           Past Medical History:  Diagnosis Date  . Anemia   . Anxiety   . Arthritis    back of neck, bones spurs on neck  . Cancer ([place])    ovarian cancer  . Cataract   . Cervical disc disease   . Diabetes mellitus   . Family history of thyroid cancer   . Heart murmur   . Hyperlipidemia   . Hypertension   . Mucoid cyst of joint    right thumb  . Neuropathy   . Port-A-Cath in place 07/21/2019  . Reflux   . Sleep apnea    wears CPAP nightly  . Vertigo     Past Surgical History:  Procedure Laterality Date  . ABDOMINAL HYSTERECTOMY     vaginal  . BLADDER SURGERY    . BREAST EXCISIONAL BIOPSY Bilateral   . BREAST SURGERY    . CHOLECYSTECTOMY    . fibroids removed     breast (both breasts)  . IR IMAGING GUIDED PORT INSERTION  07/26/2019   right  . MASS EXCISION Right 06/26/2016   Procedure: EXCISION MUCOID TUMOR RIGHT THUMB, IP RIGHT THUMB;  Surgeon: Daryll Brod, MD;  Location: [facility];  Service: Orthopedics;  Laterality: Right;  . PARTIAL HYSTERECTOMY    . ROBOTIC ASSISTED BILATERAL SALPINGO OOPHERECTOMY N/A 01/03/2020   Procedure: XI ROBOTIC ASSISTED BILATERAL SALPINGO OOPHORECTOMY, RADICAL TUMOR DEBULKING;  Surgeon: Everitt Amber, MD;  Location: [facility];  Service: Gynecology;  Laterality: N/A;  . ROBOTIC PELVIC AND PARA-AORTIC  LYMPH NODE DISSECTION N/A 01/03/2020   Procedure: XI ROBOTIC PARA-AORTIC LYMPHADENECTOMY;  Surgeon: Everitt Amber, MD;  Location: [facility];  Service: Gynecology;  Laterality: N/A;    There were no vitals filed for this visit.   Subjective Assessment - 05/31/20 1552    Subjective COVID-19 screen performed prior to patient entering clinic.  Did good after traction    Pertinent History Ovarian cancer ("cancer-free".), DM, neuropathy, cervical disc disease, OA, HTN.    How long can you sit comfortably? Varies.    Patient Stated Goals Reduce pain and get more motion in left shoulder.  Become more active.    Currently in Pain? Yes    Pain Score 6     Pain Location Neck    Pain Orientation Lower    Pain Descriptors / Indicators Aching;Sharp    Pain Type Chronic pain    Pain Onset More than a month ago                                          PT Long Term Goals - 05/25/20 1406  PT LONG TERM GOAL #1   Title Perform ADL's with pain not > 3/10.    Time 6    Status New      PT LONG TERM GOAL #2   Title Active left shoulder flexion to 125 degrees so the patient can easily reach overhead    Time 6    Period Weeks    Status New      PT LONG TERM GOAL #3   Title Left shoulder ER to 55 degrees.    Time 6    Period Weeks    Status New      PT LONG TERM GOAL #4   Title Bilateral active cervical rotation to 60 degrees+.    Time 6    Period Weeks    Status New                 Plan - 05/31/20 1553    Clinical Impression Statement Pt arrived today doing a little better with traction last Rx. Rx focused on PROM for LT shldr all motions, f/b STW/ TPR to RT side UT and levator. Traction performed at 10#s and tolerated well again    Comorbidities Ovarian cancer ("cancer-free".), DM, neuropathy, cervical disc disease, OA, HTN.    Stability/Clinical Decision Making Evolving/Moderate complexity    Rehab Potential Good    PT Frequency 2x / week    PT  Duration 6 weeks    PT Treatment/Interventions ADLs/Self Care Home Management;Cryotherapy;Traction;Moist Heat;Functional mobility training;Therapeutic activities;Therapeutic exercise;Manual techniques;Patient/family education;Passive range of motion    PT Next Visit Plan FOTO...Marland KitchenMarland KitchenPer MD:  cervical traction up to 10#, left shoulder PROM, STW/M.  Progress to UE ranger and pulleys.    Consulted and Agree with Plan of Care Patient           Patient will benefit from skilled therapeutic intervention in order to improve the following deficits and impairments:  Pain,Decreased activity tolerance,Decreased range of motion,Decreased strength,Increased muscle spasms,Postural dysfunction  Visit Diagnosis: Cervicalgia  Chronic left shoulder pain  Stiffness of left shoulder, not elsewhere classified     Problem List Patient Active Problem List   Diagnosis Date Noted  . Left leg pain 05/19/2020  . Statin myopathy 02/27/2020  . Ovarian cancer ([place]) 01/03/2020  . Secondary malignant neoplasm of intra-abdominal lymph nodes ([place]) 01/03/2020  . Nontraumatic tear of right tibialis posterior tendon 12/14/2019  . Right ankle pain 12/07/2019  . Right ankle tendonitis 11/11/2019  . Aortic atherosclerosis ([place]) 10/12/2019  . Dehydration 10/06/2019  . Port-A-Cath in place 07/21/2019  . Genetic testing 07/15/2019  . Elevated CA-125 07/07/2019  . Family history of thyroid cancer   . Malignant neoplasm of both ovaries 06/20/2019  . Paronychia, finger, left 04/15/2019  . Vaginitis 04/15/2019  . Abdominal pain 04/04/2019  . Retroperitoneal mass 04/03/2019  . Acute gouty arthritis 12/17/2018  . Bilateral hearing loss 12/21/2017  . Eustachian tube disorder, bilateral 12/21/2017  . Cough 02/23/2017  . Left hand pain 05/26/2016  . Mucoid cyst, joint 05/26/2016  . Pain in finger of right hand 05/26/2016  . Primary osteoarthritis of both first carpometacarpal joints 05/26/2016  . Skin avulsion 05/26/2016   . Adhesive capsulitis of left shoulder 03/20/2016  . Fatigue 12/16/2015  . Acute bronchitis 10/31/2015  . Urinary frequency 03/09/2015  . Hearing loss, sensorineural, asymmetrical 11/09/2014  . Vestibular hypofunction, left 11/09/2014  . Obesity 07/25/2014  . Orthostatic hypotension 07/07/2014  . Upper airway cough syndrome 06/26/2014  . Diarrhea 05/02/2014  . Dizziness 04/12/2014  .  Neuropathy   . Rash and nonspecific skin eruption 08/30/2013  . Paresthesia of both feet 08/30/2013  . Blurred vision 02/15/2013  . Eye pain 02/15/2013  . Sinusitis, chronic 10/07/2012  . Left shoulder pain 04/15/2012  . Encounter for well adult exam with abnormal findings 10/22/2011  . Cervical disc disease 10/22/2011  . Bilateral hand pain 08/05/2011  . SNHL (sensorineural hearing loss) 08/04/2011  . Fibrocystic breast disease 07/02/2011  . Hypertension 07/01/2011  . GERD (gastroesophageal reflux disease) 06/29/2011  . Bloating 06/29/2011  . Back pain 03/13/2011  . Vertigo 01/04/2011  . Nystagmus 01/04/2011  . Insomnia 10/21/2010  . Other constipation 08/17/2009  . Vitamin D deficiency 05/15/2009  . DYSPNEA 03/13/2009  . Depression with anxiety 01/16/2009  . Irritable bowel syndrome 01/16/2009  . Primary osteoarthritis of both hands 12/15/2008  . Diabetes mellitus type 2, noninsulin dependent ([place]) 11/03/2008  . Hyperlipidemia 11/03/2008  . Anxiety state 11/03/2008  . OVERACTIVE BLADDER 11/03/2008  . OSA (obstructive sleep apnea) 11/03/2008  . MURMUR 11/03/2008    Laura [name],CHRIS, PTA 05/31/2020, 5:56 PM  [facility] [address] [place], [place], [address] Phone: [phone]   Fax:  [phone]  Name: FALLAN MCCAREY MRN: [id number] Date of Birth: [birth year]

## 2020-06-01 ENCOUNTER — Telehealth: Payer: Self-pay | Admitting: Oncology

## 2020-06-01 DIAGNOSIS — C569 Malignant neoplasm of unspecified ovary: Secondary | ICD-10-CM

## 2020-06-01 NOTE — Telephone Encounter (Signed)
[facility] and reviewed her CA 125 and CT scan results.  Advised her that that Dr. Denman George has reviewed the results and that given her rising CA 125 level, there could be cancer growing somewhere but it is not showing up on the the scan.  She is recommending a GI workup for the area seen on the scan in her colon.  Advised her we will place a referral to [place] and to expect a call for an appointment.  Laura [name] was also concerned about the aortic atherosclerosis that was seen on the scan.  Advised her to contact her PCP to see if they have any recommendations.  Encouraged her to call back with any questions.

## 2020-06-04 ENCOUNTER — Other Ambulatory Visit (HOSPITAL_COMMUNITY): Payer: Medicare Other

## 2020-06-05 ENCOUNTER — Ambulatory Visit: Payer: Medicare Other

## 2020-06-05 ENCOUNTER — Other Ambulatory Visit (HOSPITAL_COMMUNITY): Payer: Self-pay

## 2020-06-05 ENCOUNTER — Other Ambulatory Visit: Payer: Self-pay | Admitting: Internal Medicine

## 2020-06-05 ENCOUNTER — Other Ambulatory Visit: Payer: Self-pay

## 2020-06-05 DIAGNOSIS — M542 Cervicalgia: Secondary | ICD-10-CM | POA: Diagnosis not present

## 2020-06-05 DIAGNOSIS — G8929 Other chronic pain: Secondary | ICD-10-CM | POA: Diagnosis not present

## 2020-06-05 DIAGNOSIS — M25612 Stiffness of left shoulder, not elsewhere classified: Secondary | ICD-10-CM

## 2020-06-05 DIAGNOSIS — M25512 Pain in left shoulder: Secondary | ICD-10-CM

## 2020-06-05 MED ORDER — ESZOPICLONE 2 MG PO TABS
2.0000 mg | ORAL_TABLET | Freq: Every evening | ORAL | 1 refills | Status: DC | PRN
  Filled 2020-06-05: qty 30, 30d supply, fill #0
  Filled 2020-08-13: qty 30, 30d supply, fill #1
  Filled 2020-08-14: qty 30, 30d supply, fill #0

## 2020-06-05 MED FILL — Solifenacin Succinate Tab 5 MG: ORAL | 90 days supply | Qty: 90 | Fill #0 | Status: AC

## 2020-06-05 NOTE — Telephone Encounter (Signed)
Not sure what she wants  Please to clarify

## 2020-06-05 NOTE — Telephone Encounter (Signed)
    Patient calling to report she is having trouble sleeping again Requesting new order Last seen 05/16/20

## 2020-06-05 NOTE — Addendum Note (Signed)
Addended by: Biagio Borg on: 06/05/2020 04:49 PM   Modules accepted: Orders

## 2020-06-05 NOTE — Therapy (Signed)
[facility] [place], [place], [address] Phone: [phone]   Fax:  [phone]  Physical Therapy Treatment  Patient Details  Name: Laura [name] MRN: [id number] Date of Birth: 26-Jul-1945 Referring Provider (PT): Basil Dess MD   Encounter Date: 06/05/2020   PT End of Session - 06/05/20 1130    Visit Number 3    Number of Visits 12    Date for PT Re-Evaluation 08/23/20    Authorization Type FOTO AT LEAST EVERY 5TH VISIT.  PROGRESS NOTE AT 10TH VISIT.  KX MODIFIER AFTER 15 VISITS.    PT Start Time 1033    PT Stop Time 1125    PT Time Calculation (min) 52 min    Equipment Utilized During Treatment Other (comment)   traction unit   Activity Tolerance Patient tolerated treatment well    Behavior During Therapy WFL for tasks assessed/performed           Past Medical History:  Diagnosis Date  . Anemia   . Anxiety   . Arthritis    back of neck, bones spurs on neck  . Cancer ([place])    ovarian cancer  . Cataract   . Cervical disc disease   . Diabetes mellitus   . Family history of thyroid cancer   . Heart murmur   . Hyperlipidemia   . Hypertension   . Mucoid cyst of joint    right thumb  . Neuropathy   . Port-A-Cath in place 07/21/2019  . Reflux   . Sleep apnea    wears CPAP nightly  . Vertigo     Past Surgical History:  Procedure Laterality Date  . ABDOMINAL HYSTERECTOMY     vaginal  . BLADDER SURGERY    . BREAST EXCISIONAL BIOPSY Bilateral   . BREAST SURGERY    . CHOLECYSTECTOMY    . fibroids removed     breast (both breasts)  . IR IMAGING GUIDED PORT INSERTION  07/26/2019   right  . MASS EXCISION Right 06/26/2016   Procedure: EXCISION MUCOID TUMOR RIGHT THUMB, IP RIGHT THUMB;  Surgeon: Daryll Brod, MD;  Location: [facility];  Service: Orthopedics;  Laterality: Right;  . PARTIAL HYSTERECTOMY    . ROBOTIC ASSISTED BILATERAL SALPINGO OOPHERECTOMY N/A 01/03/2020   Procedure: XI ROBOTIC  ASSISTED BILATERAL SALPINGO OOPHORECTOMY, RADICAL TUMOR DEBULKING;  Surgeon: Everitt Amber, MD;  Location: [facility];  Service: Gynecology;  Laterality: N/A;  . ROBOTIC PELVIC AND PARA-AORTIC LYMPH NODE DISSECTION N/A 01/03/2020   Procedure: XI ROBOTIC PARA-AORTIC LYMPHADENECTOMY;  Surgeon: Everitt Amber, MD;  Location: [facility];  Service: Gynecology;  Laterality: N/A;    There were no vitals filed for this visit.   Subjective Assessment - 06/05/20 1110    Subjective COVID-19 screen performed prior to patient entering clinic. Patient states that her shoulder has been a little more sore lately. Pt state that her neck pain.    Pertinent History Ovarian cancer ("cancer-free".), DM, neuropathy, cervical disc disease, OA, HTN.    How long can you sit comfortably? Varies.    Patient Stated Goals Reduce pain and get more motion in left shoulder.  Become more active.    Currently in Pain? Yes    Pain Score 4     Pain Location Neck    Pain Orientation Lower    Pain Descriptors / Indicators Aching    Pain Type Chronic pain    Pain Onset More than a month ago    Pain Frequency  Constant    Multiple Pain Sites Yes    Pain Score 6    Pain Location Shoulder    Pain Orientation Left    Pain Descriptors / Indicators Sharp;Shooting;Tightness    Pain Type Chronic pain    Pain Onset More than a month ago    Pain Frequency Constant                             [facility] Adult PT Treatment/Exercise - 06/05/20 1032      Posture/Postural Control   Posture/Postural Control Postural limitations    Postural Limitations Rounded Shoulders;Forward head      Self-Care   Self-Care Posture      Exercises   Exercises Shoulder      Shoulder Exercises: Supine   Protraction AROM   sidelying with therapist blocking at scapula     Shoulder Exercises: Sidelying   Other Sidelying Exercises scapular retraction; AAROM with cane      Modalities   Modalities Traction      Traction   Type of Traction  Cervical    Min (lbs) 5    Max (lbs) 10    Hold Time 99    Time 15      Manual Therapy   Manual Therapy Joint mobilization;Soft tissue mobilization;Scapular mobilization;Passive ROM    Manual therapy comments Improved AROM shulder flexion ; inproved ER to be able to reach behind her head - subjectively pt reports feeling manual therapy felt that it helped her a lot    Joint Mobilization anterior-posterior GHJ mob gr III    Soft tissue mobilization UT STM - pin and stretch; STM to levator scapula; STM se    Scapular Mobilization scapular blocking with elevation of the LUE; medial glide    Passive ROM PROM flexio; IR; ER                       PT Long Term Goals - 05/25/20 1406      PT LONG TERM GOAL #1   Title Perform ADL's with pain not > 3/10.    Time 6    Status New      PT LONG TERM GOAL #2   Title Active left shoulder flexion to 125 degrees so the patient can easily reach overhead    Time 6    Period Weeks    Status New      PT LONG TERM GOAL #3   Title Left shoulder ER to 55 degrees.    Time 6    Period Weeks    Status New      PT LONG TERM GOAL #4   Title Bilateral active cervical rotation to 60 degrees+.    Time 6    Period Weeks    Status New                 Plan - 06/05/20 1115    Clinical Impression Statement Patient reports significant improvement in cervicalgis. Today's session promotoed improved shoulder AROM and PROM with manual therapy and AAROM. Progressed HEP and provided written hanout to include AAROM with dowel. Pt greatly limited at the latisimus dorsi, and shoulder external rotators - skilled PT recommended to reduce deficits and improve function for prior level of function and quality of life.    Personal Factors and Comorbidities Comorbidity 1;Other    Comorbidities Ovarian cancer ("cancer-free".), DM, neuropathy, cervical disc disease, OA, HTN.  Examination-Activity Limitations Other;Reach Overhead    Stability/Clinical  Decision Making Evolving/Moderate complexity    Clinical Decision Making Low    Rehab Potential Good    PT Frequency 2x / week    PT Duration 6 weeks    PT Treatment/Interventions ADLs/Self Care Home Management;Cryotherapy;Traction;Moist Heat;Functional mobility training;Therapeutic activities;Therapeutic exercise;Manual techniques;Patient/family education;Passive range of motion    PT Next Visit Plan FOTO...Marland KitchenMarland KitchenPer MD:  cervical traction up to 10#, left shoulder PROM, STW/M.  Progress to UE ranger and pulleys.    Consulted and Agree with Plan of Care Patient           Patient will benefit from skilled therapeutic intervention in order to improve the following deficits and impairments:  Pain,Decreased activity tolerance,Decreased range of motion,Decreased strength,Increased muscle spasms,Postural dysfunction  Visit Diagnosis: Cervicalgia  Chronic left shoulder pain  Stiffness of left shoulder, not elsewhere classified     Problem List Patient Active Problem List   Diagnosis Date Noted  . Left leg pain 05/19/2020  . Statin myopathy 02/27/2020  . Ovarian cancer ([place]) 01/03/2020  . 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 Nontraumatic tear of right tibialis posterior tendon 12/14/2019  . Right ankle pain 12/07/2019  . Right ankle tendonitis 11/11/2019  . Aortic atherosclerosis ([place]) 10/12/2019  . Dehydration 10/06/2019  . Port-A-Cath in place 07/21/2019  . Genetic testing 07/15/2019  . Elevated CA-125 07/07/2019  . Family history of thyroid cancer   . Malignant neoplasm of both ovaries 06/20/2019  . Paronychia, finger, left 04/15/2019  . Vaginitis 04/15/2019  . Abdominal pain 04/04/2019  . Retroperitoneal mass 04/03/2019  . Acute gouty arthritis 12/17/2018  . Bilateral hearing loss 12/21/2017  . Eustachian tube disorder, bilateral 12/21/2017  . Cough 02/23/2017  . Left hand pain 05/26/2016  . Mucoid cyst, joint 05/26/2016  . Pain  in finger of right hand 05/26/2016  . Primary osteoarthritis of both first carpometacarpal joints 05/26/2016  . Skin avulsion 05/26/2016  . Adhesive capsulitis of left shoulder 03/20/2016  . Fatigue 12/16/2015  . Acute bronchitis 10/31/2015  . Urinary frequency 03/09/2015  . Hearing loss, sensorineural, asymmetrical 11/09/2014  . Vestibular hypofunction, left 11/09/2014  . Obesity 07/25/2014  . Orthostatic hypotension 07/07/2014  . Upper airway cough syndrome 06/26/2014  . Diarrhea 05/02/2014  . Dizziness 04/12/2014  . Neuropathy   . Rash and nonspecific skin eruption 08/30/2013  . Paresthesia of both feet 08/30/2013  . Blurred vision 02/15/2013  . Eye pain 02/15/2013  . Sinusitis, chronic 10/07/2012  . Left shoulder pain 04/15/2012  . Encounter for well adult exam with abnormal findings 10/22/2011  . Cervical disc disease 10/22/2011  . Bilateral hand pain 08/05/2011  . SNHL (sensorineural hearing loss) 08/04/2011  . Fibrocystic breast disease 07/02/2011  . Hypertension 07/01/2011  . GERD (gastroesophageal reflux disease) 06/29/2011  . Bloating 06/29/2011  . Back pain 03/13/2011  . Vertigo 01/04/2011  . Nystagmus 01/04/2011  . Insomnia 10/21/2010  . Other constipation 08/17/2009  . Vitamin D deficiency 05/15/2009  . DYSPNEA 03/13/2009  . Depression with anxiety 01/16/2009  . Irritable bowel syndrome 01/16/2009  . Primary osteoarthritis of both hands 12/15/2008  . Diabetes mellitus type 2, noninsulin dependent ([place]) 11/03/2008  . Hyperlipidemia 11/03/2008  . Anxiety state 11/03/2008  . OVERACTIVE BLADDER 11/03/2008  . OSA (obstructive sleep apnea) 11/03/2008  . MURMUR 11/03/2008    [place], DPT 06/05/2020, 11:35 AM  [facility] [place], [place], [address] Phone:  [phone]   Fax:  [phone]  Name: Laura [name] MRN: [id number] Date of Birth: 06-May-1945

## 2020-06-05 NOTE — Telephone Encounter (Signed)
Spoke with patient and she would like a refill of Lunesta. Per patient, she is having trouble sleeping due to recent abnormal labwork regarding cancer and chemo. Please advise. PMP done and last filled 01/18/20

## 2020-06-06 ENCOUNTER — Other Ambulatory Visit (HOSPITAL_COMMUNITY): Payer: Self-pay

## 2020-06-06 ENCOUNTER — Telehealth: Payer: Self-pay | Admitting: Oncology

## 2020-06-06 NOTE — Telephone Encounter (Signed)
Called [facility] regarding referral to Dr. Ardis Hughs.  They will call the patient to schedule an appointment.

## 2020-06-08 ENCOUNTER — Ambulatory Visit: Payer: Medicare Other | Admitting: *Deleted

## 2020-06-08 ENCOUNTER — Other Ambulatory Visit: Payer: Self-pay

## 2020-06-08 DIAGNOSIS — G8929 Other chronic pain: Secondary | ICD-10-CM | POA: Diagnosis not present

## 2020-06-08 DIAGNOSIS — M25612 Stiffness of left shoulder, not elsewhere classified: Secondary | ICD-10-CM

## 2020-06-08 DIAGNOSIS — M542 Cervicalgia: Secondary | ICD-10-CM | POA: Diagnosis not present

## 2020-06-08 DIAGNOSIS — M25512 Pain in left shoulder: Secondary | ICD-10-CM | POA: Diagnosis not present

## 2020-06-08 NOTE — Therapy (Signed)
[facility] [place], [place], [address] Phone: [phone]   Fax:  [phone]  Physical Therapy Treatment  Patient Details  Name: Laura [name] MRN: [id number] Date of Birth: [birth year] Referring Provider (PT): Basil Dess MD   Encounter Date: 06/08/2020   PT End of Session - 06/08/20 1036    Visit Number 4    Number of Visits 12    Date for PT Re-Evaluation 08/23/20    Authorization Type FOTO AT LEAST EVERY 5TH VISIT.  PROGRESS NOTE AT 10TH VISIT.  KX MODIFIER AFTER 15 VISITS.    PT Start Time 1030    PT Stop Time 1127    PT Time Calculation (min) 57 min           Past Medical History:  Diagnosis Date  . Anemia   . Anxiety   . Arthritis    back of neck, bones spurs on neck  . Cancer ([place])    ovarian cancer  . Cataract   . Cervical disc disease   . Diabetes mellitus   . Family history of thyroid cancer   . Heart murmur   . Hyperlipidemia   . Hypertension   . Mucoid cyst of joint    right thumb  . Neuropathy   . Port-A-Cath in place 07/21/2019  . Reflux   . Sleep apnea    wears CPAP nightly  . Vertigo     Past Surgical History:  Procedure Laterality Date  . ABDOMINAL HYSTERECTOMY     vaginal  . BLADDER SURGERY    . BREAST EXCISIONAL BIOPSY Bilateral   . BREAST SURGERY    . CHOLECYSTECTOMY    . fibroids removed     breast (both breasts)  . IR IMAGING GUIDED PORT INSERTION  07/26/2019   right  . MASS EXCISION Right 06/26/2016   Procedure: EXCISION MUCOID TUMOR RIGHT THUMB, IP RIGHT THUMB;  Surgeon: Daryll Brod, MD;  Location: [facility];  Service: Orthopedics;  Laterality: Right;  . PARTIAL HYSTERECTOMY    . ROBOTIC ASSISTED BILATERAL SALPINGO OOPHERECTOMY N/A 01/03/2020   Procedure: XI ROBOTIC ASSISTED BILATERAL SALPINGO OOPHORECTOMY, RADICAL TUMOR DEBULKING;  Surgeon: Everitt Amber, MD;  Location: [facility];  Service: Gynecology;  Laterality: N/A;  . ROBOTIC PELVIC AND PARA-AORTIC LYMPH  NODE DISSECTION N/A 01/03/2020   Procedure: XI ROBOTIC PARA-AORTIC LYMPHADENECTOMY;  Surgeon: Everitt Amber, MD;  Location: [facility];  Service: Gynecology;  Laterality: N/A;    There were no vitals filed for this visit.   Subjective Assessment - 06/08/20 1031    Subjective COVID-19 screen performed prior to patient entering clinic. Patient states her neck is doing better and her shldr is still sore    Pertinent History Ovarian cancer ("cancer-free".), DM, neuropathy, cervical disc disease, OA, HTN.    Patient Stated Goals Reduce pain and get more motion in left shoulder.  Become more active.    Currently in Pain? Yes    Pain Score 2     Pain Location Neck    Pain Orientation Lower    Pain Descriptors / Indicators Aching    Pain Type Chronic pain    Pain Onset More than a month ago                             [facility] Adult PT Treatment/Exercise - 06/08/20 0001      Exercises   Exercises Shoulder      Shoulder  Exercises: Seated   Retraction AROM   2x10 for scapular positioning for  good posture 5 sec holds   External Rotation AROM;Both   ER with 5 sec holds with scapular retraction for posture     Shoulder Exercises: Pulleys   Flexion 3 minutes;5 minutes      Modalities   Modalities Traction      Traction   Type of Traction Cervical    Min (lbs) 5    Max (lbs) 10    Hold Time 99    Rest Time 5    Time 15      Manual Therapy   Manual Therapy Joint mobilization;Soft tissue mobilization;Scapular mobilization;Passive ROM    Soft tissue mobilization STW and TPR  to LT UT and levator scap    Passive ROM PROM / AAROM flexion and  ER with end-range holds.                       PT Long Term Goals - 05/25/20 1406      PT LONG TERM GOAL #1   Title Perform ADL's with pain not > 3/10.    Time 6    Status New      PT LONG TERM GOAL #2   Title Active left shoulder flexion to 125 degrees so the patient can easily reach overhead    Time 6    Period  Weeks    Status New      PT LONG TERM GOAL #3   Title Left shoulder ER to 55 degrees.    Time 6    Period Weeks    Status New      PT LONG TERM GOAL #4   Title Bilateral active cervical rotation to 60 degrees+.    Time 6    Period Weeks    Status New                 Plan - 06/08/20 1038    Clinical Impression Statement Pt arrived today doing fairly well with decreased neck pain, but still with shldr / upper arm soreness. Rx focused on AAROM/AROM/ PROM for LT shldr as well as posture exs with improved awareness of scapular position during shldr exs. Traction performed at 10 #s again and tolerated well.    Personal Factors and Comorbidities Comorbidity 1;Other    Comorbidities Ovarian cancer ("cancer-free".), DM, neuropathy, cervical disc disease, OA, HTN.    Examination-Activity Limitations Other;Reach Overhead    PT Treatment/Interventions ADLs/Self Care Home Management;Cryotherapy;Traction;Moist Heat;Functional mobility training;Therapeutic activities;Therapeutic exercise;Manual techniques;Patient/family education;Passive range of motion    PT Next Visit Plan FOTO...Marland KitchenMarland KitchenPer MD:  cervical traction up to 10#, left shoulder PROM, STW/M.  Progress to UE ranger and pulleys.    Consulted and Agree with Plan of Care Patient           Patient will benefit from skilled therapeutic intervention in order to improve the following deficits and impairments:  Pain,Decreased activity tolerance,Decreased range of motion,Decreased strength,Increased muscle spasms,Postural dysfunction  Visit Diagnosis: Cervicalgia  Chronic left shoulder pain  Stiffness of left shoulder, not elsewhere classified     Problem List Patient Active Problem List   Diagnosis Date Noted  . Left leg pain 05/19/2020  . Statin myopathy 02/27/2020  . Ovarian cancer ([place]) 01/03/2020  . 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 Nontraumatic tear of right tibialis posterior  tendon 12/14/2019  . Right ankle pain 12/07/2019  . Right ankle tendonitis 11/11/2019  .  Aortic atherosclerosis ([place]) 10/12/2019  . Dehydration 10/06/2019  . Port-A-Cath in place 07/21/2019  . Genetic testing 07/15/2019  . Elevated CA-125 07/07/2019  . Family history of thyroid cancer   . Malignant neoplasm of both ovaries 06/20/2019  . Paronychia, finger, left 04/15/2019  . Vaginitis 04/15/2019  . Abdominal pain 04/04/2019  . Retroperitoneal mass 04/03/2019  . Acute gouty arthritis 12/17/2018  . Bilateral hearing loss 12/21/2017  . Eustachian tube disorder, bilateral 12/21/2017  . Cough 02/23/2017  . Left hand pain 05/26/2016  . Mucoid cyst, joint 05/26/2016  . Pain in finger of right hand 05/26/2016  . Primary osteoarthritis of both first carpometacarpal joints 05/26/2016  . Skin avulsion 05/26/2016  . Adhesive capsulitis of left shoulder 03/20/2016  . Fatigue 12/16/2015  . Acute bronchitis 10/31/2015  . Urinary frequency 03/09/2015  . Hearing loss, sensorineural, asymmetrical 11/09/2014  . Vestibular hypofunction, left 11/09/2014  . Obesity 07/25/2014  . Orthostatic hypotension 07/07/2014  . Upper airway cough syndrome 06/26/2014  . Diarrhea 05/02/2014  . Dizziness 04/12/2014  . Neuropathy   . Rash and nonspecific skin eruption 08/30/2013  . Paresthesia of both feet 08/30/2013  . Blurred vision 02/15/2013  . Eye pain 02/15/2013  . Sinusitis, chronic 10/07/2012  . Left shoulder pain 04/15/2012  . Encounter for well adult exam with abnormal findings 10/22/2011  . Cervical disc disease 10/22/2011  . Bilateral hand pain 08/05/2011  . SNHL (sensorineural hearing loss) 08/04/2011  . Fibrocystic breast disease 07/02/2011  . Hypertension 07/01/2011  . GERD (gastroesophageal reflux disease) 06/29/2011  . Bloating 06/29/2011  . Back pain 03/13/2011  . Vertigo 01/04/2011  . Nystagmus 01/04/2011  . Insomnia 10/21/2010  . Other constipation 08/17/2009  . Vitamin D  deficiency 05/15/2009  . DYSPNEA 03/13/2009  . Depression with anxiety 01/16/2009  . Irritable bowel syndrome 01/16/2009  . Primary osteoarthritis of both hands 12/15/2008  . Diabetes mellitus type 2, noninsulin dependent ([place]) 11/03/2008  . Hyperlipidemia 11/03/2008  . Anxiety state 11/03/2008  . OVERACTIVE BLADDER 11/03/2008  . OSA (obstructive sleep apnea) 11/03/2008  . MURMUR 11/03/2008    Loella Hickle,CHRIS , PTA 06/08/2020, 11:47 AM  [facility] [address] [place], [place], [address] Phone: [phone]   Fax:  [phone]  Name: Laura [name] MRN: [id number] Date of Birth: 1945-08-08

## 2020-06-12 ENCOUNTER — Other Ambulatory Visit: Payer: Self-pay

## 2020-06-12 ENCOUNTER — Ambulatory Visit (INDEPENDENT_AMBULATORY_CARE_PROVIDER_SITE_OTHER): Payer: Medicare Other

## 2020-06-12 VITALS — BP 138/80 | HR 83 | Temp 97.9°F | Ht 64.0 in | Wt 194.8 lb

## 2020-06-12 DIAGNOSIS — Z0000 Encounter for general adult medical examination without abnormal findings: Secondary | ICD-10-CM | POA: Diagnosis not present

## 2020-06-12 NOTE — Patient Instructions (Signed)
Ms. Singleterry , Thank you for taking time to come for your Medicare Wellness Visit. I appreciate your ongoing commitment to your health goals. Please review the following plan we discussed and let me know if I can assist you in the future.   Screening recommendations/referrals: Colonoscopy: 09/19/2016; due every 5 years (due 2023) Mammogram: 11/24/2019; due every year Bone Density: 07/06/2015; completed no longer recommended Recommended yearly ophthalmology/optometry visit for glaucoma screening and checkup Recommended yearly dental visit for hygiene and checkup  Vaccinations: Influenza vaccine: 10/11/2019 Pneumococcal vaccine: 02/15/2013, 02/23/2017 Tdap vaccine: 06/29/2015; due every 10 years Shingles vaccine: declined Covid-19: declined  Advanced directives: Please bring a copy of your health care power of attorney and living will to the office at your convenience.  Conditions/risks identified: Yes; Reviewed health maintenance screenings with patient today and relevant education, vaccines, and/or referrals were provided. Please continue to do your personal lifestyle choices by: daily care of teeth and gums, regular physical activity (goal should be 5 days a week for 30 minutes), eat a healthy diet, avoid tobacco and drug use, limiting any alcohol intake, taking a low-dose aspirin (if not allergic or have been advised by your provider otherwise) and taking vitamins and minerals as recommended by your provider. Continue doing brain stimulating activities (puzzles, reading, adult coloring books, staying active) to keep memory sharp. Continue to eat heart healthy diet (full of fruits, vegetables, whole grains, lean protein, water--limit salt, fat, and sugar intake) and increase physical activity as tolerated.  Next appointment: Please schedule your next Medicare Wellness Visit with your Nurse Health Advisor in 1 year by calling [phone].  Preventive Care 67 Years and Older, Female Preventive care  refers to lifestyle choices and visits with your health care provider that can promote health and wellness. What does preventive care include?  A yearly physical exam. This is also called an annual well check.  Dental exams once or twice a year.  Routine eye exams. Ask your health care provider how often you should have your eyes checked.  Personal lifestyle choices, including:  Daily care of your teeth and gums.  Regular physical activity.  Eating a healthy diet.  Avoiding tobacco and drug use.  Limiting alcohol use.  Practicing safe sex.  Taking low-dose aspirin every day.  Taking vitamin and mineral supplements as recommended by your health care provider. What happens during an annual well check? The services and screenings done by your health care provider during your annual well check will depend on your age, overall health, lifestyle risk factors, and family history of disease. Counseling  Your health care provider may ask you questions about your:  Alcohol use.  Tobacco use.  Drug use.  Emotional well-being.  Home and relationship well-being.  Sexual activity.  Eating habits.  History of falls.  Memory and ability to understand (cognition).  Work and work Statistician.  Reproductive health. Screening  You may have the following tests or measurements:  Height, weight, and BMI.  Blood pressure.  Lipid and cholesterol levels. These may be checked every 5 years, or more frequently if you are over 70 years old.  Skin check.  Lung cancer screening. You may have this screening every year starting at age 34 if you have a 30-pack-year history of smoking and currently smoke or have quit within the past 15 years.  Fecal occult blood test (FOBT) of the stool. You may have this test every year starting at age 71.  Flexible sigmoidoscopy or colonoscopy. You may have a sigmoidoscopy  every 5 years or a colonoscopy every 10 years starting at age  72.  Hepatitis C blood test.  Hepatitis B blood test.  Sexually transmitted disease (STD) testing.  Diabetes screening. This is done by checking your blood sugar (glucose) after you have not eaten for a while (fasting). You may have this done every 1-3 years.  Bone density scan. This is done to screen for osteoporosis. You may have this done starting at age 41.  Mammogram. This may be done every 1-2 years. Talk to your health care provider about how often you should have regular mammograms. Talk with your health care provider about your test results, treatment options, and if necessary, the need for more tests. Vaccines  Your health care provider may recommend certain vaccines, such as:  Influenza vaccine. This is recommended every year.  Tetanus, diphtheria, and acellular pertussis (Tdap, Td) vaccine. You may need a Td booster every 10 years.  Zoster vaccine. You may need this after age 72.  Pneumococcal 13-valent conjugate (PCV13) vaccine. One dose is recommended after age 29.  Pneumococcal polysaccharide (PPSV23) vaccine. One dose is recommended after age 27. Talk to your health care provider about which screenings and vaccines you need and how often you need them. This information is not intended to replace advice given to you by your health care provider. Make sure you discuss any questions you have with your health care provider. Document Released: 01/26/2015 Document Revised: 09/19/2015 Document Reviewed: 10/31/2014 Elsevier Interactive Patient Education  2017 [place] Prevention in the Home Falls can cause injuries. They can happen to people of all ages. There are many things you can do to make your home safe and to help prevent falls. What can I do on the outside of my home?  Regularly fix the edges of walkways and driveways and fix any cracks.  Remove anything that might make you trip as you walk through a door, such as a raised step or threshold.  Trim  any bushes or trees on the path to your home.  Use bright outdoor lighting.  Clear any walking paths of anything that might make someone trip, such as rocks or tools.  Regularly check to see if handrails are loose or broken. Make sure that both sides of any steps have handrails.  Any raised decks and porches should have guardrails on the edges.  Have any leaves, snow, or ice cleared regularly.  Use sand or salt on walking paths during winter.  Clean up any spills in your garage right away. This includes oil or grease spills. What can I do in the bathroom?  Use night lights.  Install grab bars by the toilet and in the tub and shower. Do not use towel bars as grab bars.  Use non-skid mats or decals in the tub or shower.  If you need to sit down in the shower, use a plastic, non-slip stool.  Keep the floor dry. Clean up any water that spills on the floor as soon as it happens.  Remove soap buildup in the tub or shower regularly.  Attach bath mats securely with double-sided non-slip rug tape.  Do not have throw rugs and other things on the floor that can make you trip. What can I do in the bedroom?  Use night lights.  Make sure that you have a light by your bed that is easy to reach.  Do not use any sheets or blankets that are too big for your bed. They should  not hang down onto the floor.  Have a firm chair that has side arms. You can use this for support while you get dressed.  Do not have throw rugs and other things on the floor that can make you trip. What can I do in the kitchen?  Clean up any spills right away.  Avoid walking on wet floors.  Keep items that you use a lot in easy-to-reach places.  If you need to reach something above you, use a strong step stool that has a grab bar.  Keep electrical cords out of the way.  Do not use floor polish or wax that makes floors slippery. If you must use wax, use non-skid floor wax.  Do not have throw rugs and other  things on the floor that can make you trip. What can I do with my stairs?  Do not leave any items on the stairs.  Make sure that there are handrails on both sides of the stairs and use them. Fix handrails that are broken or loose. Make sure that handrails are as long as the stairways.  Check any carpeting to make sure that it is firmly attached to the stairs. Fix any carpet that is loose or worn.  Avoid having throw rugs at the top or bottom of the stairs. If you do have throw rugs, attach them to the floor with carpet tape.  Make sure that you have a light switch at the top of the stairs and the bottom of the stairs. If you do not have them, ask someone to add them for you. What else can I do to help prevent falls?  Wear shoes that:  Do not have high heels.  Have rubber bottoms.  Are comfortable and fit you well.  Are closed at the toe. Do not wear sandals.  If you use a stepladder:  Make sure that it is fully opened. Do not climb a closed stepladder.  Make sure that both sides of the stepladder are locked into place.  Ask someone to hold it for you, if possible.  Clearly mark and make sure that you can see:  Any grab bars or handrails.  First and last steps.  Where the edge of each step is.  Use tools that help you move around (mobility aids) if they are needed. These include:  Canes.  Walkers.  Scooters.  Crutches.  Turn on the lights when you go into a dark area. Replace any light bulbs as soon as they burn out.  Set up your furniture so you have a clear path. Avoid moving your furniture around.  If any of your floors are uneven, fix them.  If there are any pets around you, be aware of where they are.  Review your medicines with your doctor. Some medicines can make you feel dizzy. This can increase your chance of falling. Ask your doctor what other things that you can do to help prevent falls. This information is not intended to replace advice given to  you by your health care provider. Make sure you discuss any questions you have with your health care provider. Document Released: 10/26/2008 Document Revised: 06/07/2015 Document Reviewed: 02/03/2014 Elsevier Interactive Patient Education  2017 [employer].

## 2020-06-12 NOTE — Progress Notes (Signed)
Subjective:   Laura [name] is a 75 y.o. female who presents for Medicare Annual (Subsequent) preventive examination.  Review of Systems    No ROS. Medicare Wellness Visit. Additional risk factors are reflected in social history. Cardiac Risk Factors include: advanced age (>42mn, >>74women);diabetes mellitus;family history of premature cardiovascular disease;hypertension;dyslipidemia;obesity (BMI >30kg/m2)     Objective:    Today's Vitals   06/12/20 1404  BP: 138/80  Pulse: 83  Temp: 97.9 F (36.6 C)  SpO2: 98%  Weight: 194 lb 12.8 oz (88.4 kg)  Height: 5' 4" (1.626 m)  PainSc: 7    Body mass index is 33.44 kg/m.  Advanced Directives 06/12/2020 05/25/2020 04/24/2020 04/09/2020 02/09/2020 01/24/2020 01/03/2020  Does Patient Have a Medical Advance Directive? _0  No No  Would patient like information on creating a medical advance directive? No - Patient declined - No - Patient declined No - Patient declined No - Patient declined No - Patient declined No - Patient declined  Pre-existing out of facility DNR order (yellow form or pink MOST form) - - - - - - -    Current Medications (verified) Outpatient Encounter Medications as of 06/12/2020  Medication Sig  . acetaminophen (TYLENOL) 500 MG tablet Take 1 tablet (500 mg total) by mouth every 6 (six) hours as needed.  .Marland Kitchenacetaminophen (TYLENOL) 650 MG CR tablet Take 650 mg by mouth every 8 (eight) hours as needed for pain.  .Marland KitchenamLODipine (NORVASC) 5 MG tablet TAKE 1 TABLET (5 MG TOTAL) BY MOUTH DAILY.  .Marland KitchenBlood Glucose Monitoring Suppl (ONE TOUCH ULTRA 2) w/Device KIT Use as directed  . Camphor-Eucalyptus-Menthol (VICKS VAPORUB EX) Apply 1 application topically.  . Cholecalciferol (VITAMIN D3) 125 MCG (5000 UT) TABS Take 10,000-15,000 Units by mouth daily.  . eszopiclone (LUNESTA) 2 MG TABS tablet Take 1 tablet (2 mg total) by mouth at bedtime as needed for sleep. Take immediately before bedtime  . famotidine (PEPCID) 20 MG  tablet Take 20 mg by mouth 2 (two) times daily as needed for heartburn or indigestion.  . feeding supplement (BOOST HIGH PROTEIN) LIQD Take 237 mLs by mouth daily.  .Marland Kitchenglimepiride (AMARYL) 2 MG tablet TAKE ONE TABLET BY MOUTH DAILY (Patient taking differently: Take 2 mg by mouth daily with breakfast.)  . irbesartan (AVAPRO) 75 MG tablet Take 1 tablet (75 mg total) by mouth daily.  . Lancets (ONETOUCH ULTRASOFT) lancets USE TO CHECK BLOOD SUGAR TWICE DAILY  . loperamide (IMODIUM) 2 MG capsule Take 2 mg by mouth as needed for diarrhea or loose stools.   .Marland Kitchenloratadine (CLARITIN) 10 MG tablet Take 10 mg by mouth daily as needed (allergies.).   .Marland KitchenLORazepam (ATIVAN) 0.5 MG tablet Take 1 tablet (0.5 mg total) by mouth 2 (two) times daily as needed for anxiety.  .Glory RosebushULTRA test strip USE TO CHECK BLOOD SUGARS TWO TIMES DAILY  . OVER THE COUNTER MEDICATION Apply 1 application topically daily as needed (pain). Triderma otc pain cream  . solifenacin (VESICARE) 5 MG tablet TAKE 1 TABLET (5 MG TOTAL) BY MOUTH DAILY. (Patient taking differently: Take 5 mg by mouth daily as needed (bladder pain/spasms.).)  . traMADol (ULTRAM) 50 MG tablet Take 1 tablet (50 mg total) by mouth 2 (two) times daily as needed for moderate pain (back pain).  . vitamin B-12 (CYANOCOBALAMIN) 1000 MCG tablet Take 1,000 mcg by mouth daily.   . [DISCONTINUED] enoxaparin (LOVENOX) 40 MG/0.4ML injection Inject 0.4 mLs (40 mg total)  into the skin daily for 14 days. For AFTER surgery only   No facility-administered encounter medications on file as of 06/12/2020.    Allergies (verified) Ciprofloxacin, Hydrocodone bit-homatrop mbr, Sulfa antibiotics, Augmentin [amoxicillin-pot clavulanate], Codeine, Crestor [rosuvastatin calcium], Doxycycline, Gabapentin, Keflex [cephalexin], Naproxen, Statins, and Prednisone   History: Past Medical History:  Diagnosis Date  . Anemia   . Anxiety   . Arthritis    back of neck, bones spurs on neck  .  Cancer ([facility])    ovarian cancer  . Cataract   . Cervical disc disease   . Diabetes mellitus   . Family history of thyroid cancer   . Heart murmur   . Hyperlipidemia   . Hypertension   . Mucoid cyst of joint    right thumb  . Neuropathy   . Port-A-Cath in place 07/21/2019  . Reflux   . Sleep apnea    wears CPAP nightly  . Vertigo    Past Surgical History:  Procedure Laterality Date  . ABDOMINAL HYSTERECTOMY     vaginal  . BLADDER SURGERY    . BREAST EXCISIONAL BIOPSY Bilateral   . BREAST SURGERY    . CHOLECYSTECTOMY    . fibroids removed     breast (both breasts)  . IR IMAGING GUIDED PORT INSERTION  07/26/2019   right  . MASS EXCISION Right 06/26/2016   Procedure: EXCISION MUCOID TUMOR RIGHT THUMB, IP RIGHT THUMB;  Surgeon: Kuzma, Gary, MD;  Location: [facility];  Service: Orthopedics;  Laterality: Right;  . PARTIAL HYSTERECTOMY    . ROBOTIC ASSISTED BILATERAL SALPINGO OOPHERECTOMY N/A 01/03/2020   Procedure: XI ROBOTIC ASSISTED BILATERAL SALPINGO OOPHORECTOMY, RADICAL TUMOR DEBULKING;  Surgeon: Rossi, Emma, MD;  Location: [facility];  Service: Gynecology;  Laterality: N/A;  . ROBOTIC PELVIC AND PARA-AORTIC LYMPH NODE DISSECTION N/A 01/03/2020   Procedure: XI ROBOTIC PARA-AORTIC LYMPHADENECTOMY;  Surgeon: Rossi, Emma, MD;  Location: [facility];  Service: Gynecology;  Laterality: N/A;   Family History  Problem Relation Age of Onset  . Hypertension Other   . Diabetes Father   . Hypertension Father   . Diabetes Sister   . Hypertension Mother   . Hypertension Sister   . Stroke Sister   . Diabetes Maternal Aunt   . Thyroid cancer Daughter 45  . Colon cancer Neg Hx   . Esophageal cancer Neg Hx   . Stomach cancer Neg Hx   . Rectal cancer Neg Hx   . Breast cancer Neg Hx   . Endometrial cancer Neg Hx   . Ovarian cancer Neg Hx    Social History   Socioeconomic History  . Marital status: Divorced    Spouse name: Not on file  . Number of children: 4  . Years of  education: 16  . Highest education level: Not on file  Occupational History  . Occupation: retired customer service banking  Tobacco Use  . Smoking status: Never Smoker  . Smokeless tobacco: Never Used  Vaping Use  . Vaping Use: Never used  Substance and Sexual Activity  . Alcohol use: No    Alcohol/week: 0.0 standard drinks  . Drug use: No  . Sexual activity: Not Currently  Other Topics Concern  . Not on file  Social History Narrative  . Not on file   Social Determinants of Health   Financial Resource Strain: Low Risk   . Difficulty of Paying Living Expenses: Not hard at all  Food Insecurity: No Food Insecurity  . Worried About   Running Out of Food in the Last Year: Never true  . Ran Out of Food in the Last Year: Never true  Transportation Needs: No Transportation Needs  . Lack of Transportation (Medical): No  . Lack of Transportation (Non-Medical): No  Physical Activity: Insufficiently Active  . Days of Exercise per Week: 2 days  . Minutes of Exercise per Session: 60 min  Stress: No Stress Concern Present  . Feeling of Stress : Not at all  Social Connections: Moderately Integrated  . Frequency of Communication with Friends and Family: More than three times a week  . Frequency of Social Gatherings with Friends and Family: Once a week  . Attends Religious Services: 1 to 4 times per year  . Active Member of Clubs or Organizations: No  . Attends Archivist Meetings: 1 to 4 times per year  . Marital Status: Divorced    Tobacco Counseling Counseling given: Not Answered   Clinical Intake:  Pre-visit preparation completed: Yes  Pain : 0-10 Pain Score: 7  Pain Type: Chronic pain Pain Location: Neck Pain Orientation: Right,Left Pain Radiating Towards: both shoulders Pain Descriptors / Indicators: Aching,Sharp,Stabbing Pain Onset: More than a month ago Pain Frequency: Constant Pain Relieving Factors: Tylenol, Tramadol Effect of Pain on Daily Activities:  Pain produces disability and affects the quality of life.  Pain Relieving Factors: Tylenol, Tramadol  BMI - recorded: 33.44 Nutritional Status: BMI > 30  Obese Nutritional Risks: None Diabetes: Yes CBG done?: No Did pt. bring in CBG monitor from home?: No  How often do you need to have someone help you when you read instructions, pamphlets, or other written materials from your doctor or pharmacy?: 1 - Never What is the last grade level you completed in school?: Bachelor's Degree  Diabetic? yes  Interpreter Needed?: No  Information entered by :: Lisette Abu, LPN   Activities of Daily Living In your present state of health, do you have any difficulty performing the following activities: 06/12/2020 01/03/2020  Hearing? N -  Vision? N -  Difficulty concentrating or making decisions? N -  Walking or climbing stairs? N -  Dressing or bathing? N -  Doing errands, shopping? N N  Preparing Food and eating ? N -  Using the Toilet? N -  In the past six months, have you accidently leaked urine? N -  Do you have problems with loss of bowel control? N -  Managing your Medications? N -  Managing your Finances? N -  Housekeeping or managing your Housekeeping? N -  Some recent data might be hidden    Patient Care Team: Biagio Borg, MD as PCP - General (Internal Medicine) Milus Banister, MD as Attending Physician (Gastroenterology) Melida Quitter, MD as Attending Physician (Otolaryngology) Martinique, Peter M, MD (Cardiology) Jessy Oto, MD as Consulting Physician (Orthopedic Surgery) Derek Jack, MD as Medical Oncologist (Oncology) Donetta Potts, RN as Oncology Nurse Navigator (Oncology)  Indicate any recent Medical Services you may have received from other than [facility] providers in the past year (date may be approximate).     Assessment:   This is a routine wellness examination for [facility].  Hearing/Vision screen No exam data present  Dietary issues and exercise  activities discussed: Current Exercise Habits: Structured exercise class, Type of exercise: Other - see comments (Physical Therapy 2 times a week for 45-60 minutes), Time (Minutes): 60, Frequency (Times/Week): 2, Weekly Exercise (Minutes/Week): 120, Intensity: Mild, Exercise limited by: orthopedic condition(s)  Goals Addressed  This Visit's Progress   . Patient Stated       Begin an exercise program, walk more and start back socializing with friends and family.      Depression Screen PHQ 2/9 Scores 06/12/2020 03/06/2020 02/28/2020 10/11/2019 10/11/2019 04/20/2019 09/09/2018  PHQ - 2 Score 0 0 0 1 0 0 0  PHQ- 9 Score - 0 - - 0 - -    Fall Risk Fall Risk  06/12/2020 02/28/2020 10/11/2019 10/11/2019 09/09/2018  Falls in the past year? 0 0 0 0 0  Number falls in past yr: 0 - - 0 -  Injury with Fall? 0 - - 0 -  Risk for fall due to : No Fall Risks - - No Fall Risks -  Follow up Falls evaluation completed - - Falls evaluation completed -    FALL RISK PREVENTION PERTAINING TO THE HOME:  Any stairs in or around the home? No  If so, are there any without handrails? No  Home free of loose throw rugs in walkways, pet beds, electrical cords, etc? Yes  Adequate lighting in your home to reduce risk of falls? Yes   ASSISTIVE DEVICES UTILIZED TO PREVENT FALLS:  Life alert? No  Use of a cane, walker or w/c? No  Grab bars in the bathroom? No  Shower chair or bench in shower? No  Elevated toilet seat or a handicapped toilet? No   TIMED UP AND GO:  Was the test performed? No .  Length of time to ambulate 10 feet: 0 sec.   Gait steady and fast without use of assistive device  Cognitive Function: Normal cognitive status assessed by direct observation by this Nurse Health Advisor. No abnormalities found.   MMSE - Mini Mental State Exam 07/26/2015  Not completed: (No Data)        Immunizations Immunization History  Administered Date(s) Administered  . Fluad Quad(high Dose 65+)  11/22/2018, 10/11/2019  . Influenza Split 01/06/2011, 10/22/2011  . Influenza, High Dose Seasonal PF 12/07/2012, 11/17/2013, 11/17/2013, 12/12/2015, 12/01/2016, 11/19/2017  . Pneumococcal Conjugate-13 02/15/2013  . Pneumococcal Polysaccharide-23 01/19/2006, 01/17/2008, 02/23/2017  . Td 01/15/2004  . Tdap 06/29/2015    TDAP status: Up to date  Flu Vaccine status: Up to date  Pneumococcal vaccine status: Up to date  Covid-19 vaccine status: Declined, Education has been provided regarding the importance of this vaccine but patient still declined. Advised may receive this vaccine at local pharmacy or Health Dept.or vaccine clinic. Aware to provide a copy of the vaccination record if obtained from local pharmacy or Health Dept. Verbalized acceptance and understanding.  Qualifies for Shingles Vaccine? Yes   Zostavax completed No   Shingrix Completed?: No.    Education has been provided regarding the importance of this vaccine. Patient has been advised to call insurance company to determine out of pocket expense if they have not yet received this vaccine. Advised may also receive vaccine at local pharmacy or Health Dept. Verbalized acceptance and understanding.  Screening Tests Health Maintenance  Topic Date Due  . COVID-19 Vaccine (1) Never done  . Zoster Vaccines- Shingrix (1 of 2) Never done  . INFLUENZA VACCINE  08/13/2020  . HEMOGLOBIN A1C  08/27/2020  . FOOT EXAM  10/10/2020  . OPHTHALMOLOGY EXAM  10/23/2020  . COLONOSCOPY (Pts 45-31yr Insurance coverage will need to be confirmed)  09/19/2021  . TETANUS/TDAP  06/28/2025  . DEXA SCAN  Completed  . Hepatitis C Screening  Completed  . PNA vac Low Risk Adult  Completed  .  HPV VACCINES  Aged Out    Health Maintenance  Health Maintenance Due  Topic Date Due  . COVID-19 Vaccine (1) Never done  . Zoster Vaccines- Shingrix (1 of 2) Never done    Colorectal cancer screening: Type of screening: Colonoscopy. Completed 05/19/2016.  Repeat every 5 years  Mammogram status: Completed 11/24/2019. Repeat every year  Bone Density status: Completed 07/06/2015. Results reflect: Bone density results: NORMAL. Repeat every 2 years. (completed, no longer recommended)  Lung Cancer Screening: (Low Dose CT Chest recommended if Age 55-80 years, 30 pack-year currently smoking OR have quit w/in 15years.) does not qualify.   Lung Cancer Screening Referral: no  Additional Screening:  Hepatitis C Screening: does qualify; Completed yes  Vision Screening: Recommended annual ophthalmology exams for early detection of glaucoma and other disorders of the eye. Is the patient up to date with their annual eye exam?  Yes  Who is the provider or what is the name of the office in which the patient attends annual eye exams?  Ophthalmology  If pt is not established with a provider, would they like to be referred to a provider to establish care? No .   Dental Screening: Recommended annual dental exams for proper oral hygiene  Community Resource Referral / Chronic Care Management: CRR required this visit?  No   CCM required this visit?  No      Plan:     I have personally reviewed and noted the following in the patient's chart:   . Medical and social history . Use of alcohol, tobacco or illicit drugs  . Current medications and supplements including opioid prescriptions.  . Functional ability and status . Nutritional status . Physical activity . Advanced directives . List of other physicians . Hospitalizations, surgeries, and ER visits in previous 12 months . Vitals . Screenings to include cognitive, depression, and falls . Referrals and appointments  In addition, I have reviewed and discussed with patient certain preventive protocols, quality metrics, and best practice recommendations. A written personalized care plan for preventive services as well as general preventive health recommendations were provided to patient.       N , LPN   06/12/2020   Nurse Notes:  Medications reviewed with patient; no opioid use noted.    

## 2020-06-13 ENCOUNTER — Ambulatory Visit: Payer: Medicare Other | Attending: Specialist | Admitting: *Deleted

## 2020-06-13 DIAGNOSIS — M25512 Pain in left shoulder: Secondary | ICD-10-CM | POA: Diagnosis not present

## 2020-06-13 DIAGNOSIS — G8929 Other chronic pain: Secondary | ICD-10-CM | POA: Insufficient documentation

## 2020-06-13 DIAGNOSIS — M542 Cervicalgia: Secondary | ICD-10-CM | POA: Diagnosis not present

## 2020-06-13 DIAGNOSIS — Z8616 Personal history of COVID-19: Secondary | ICD-10-CM

## 2020-06-13 DIAGNOSIS — M25612 Stiffness of left shoulder, not elsewhere classified: Secondary | ICD-10-CM | POA: Diagnosis not present

## 2020-06-13 HISTORY — DX: Personal history of COVID-19: Z86.16

## 2020-06-13 NOTE — Therapy (Signed)
[facility] [place], [place], [address] Phone: [phone]   Fax:  [phone]  Physical Therapy Treatment  Patient Details  Name: Laura [name] MRN: [id number] Date of Birth: December 10, 1945 Referring Provider (PT): Basil Dess MD   Encounter Date: 06/13/2020   PT End of Session - 06/13/20 1430    Visit Number 5    Number of Visits 12    Date for PT Re-Evaluation 08/23/20    Authorization Type FOTO AT LEAST EVERY 5TH VISIT.  PROGRESS NOTE AT 10TH VISIT.  KX MODIFIER AFTER 15 VISITS.    PT Start Time 1432    PT Stop Time 1523    PT Time Calculation (min) 51 min           Past Medical History:  Diagnosis Date  . Anemia   . Anxiety   . Arthritis    back of neck, bones spurs on neck  . Cancer ([place])    ovarian cancer  . Cataract   . Cervical disc disease   . Diabetes mellitus   . Family history of thyroid cancer   . Heart murmur   . Hyperlipidemia   . Hypertension   . Mucoid cyst of joint    right thumb  . Neuropathy   . Port-A-Cath in place 07/21/2019  . Reflux   . Sleep apnea    wears CPAP nightly  . Vertigo     Past Surgical History:  Procedure Laterality Date  . ABDOMINAL HYSTERECTOMY     vaginal  . BLADDER SURGERY    . BREAST EXCISIONAL BIOPSY Bilateral   . BREAST SURGERY    . CHOLECYSTECTOMY    . fibroids removed     breast (both breasts)  . IR IMAGING GUIDED PORT INSERTION  07/26/2019   right  . MASS EXCISION Right 06/26/2016   Procedure: EXCISION MUCOID TUMOR RIGHT THUMB, IP RIGHT THUMB;  Surgeon: Daryll Brod, MD;  Location: [facility];  Service: Orthopedics;  Laterality: Right;  . PARTIAL HYSTERECTOMY    . ROBOTIC ASSISTED BILATERAL SALPINGO OOPHERECTOMY N/A 01/03/2020   Procedure: XI ROBOTIC ASSISTED BILATERAL SALPINGO OOPHORECTOMY, RADICAL TUMOR DEBULKING;  Surgeon: Everitt Amber, MD;  Location: [facility];  Service: Gynecology;  Laterality: N/A;  . ROBOTIC PELVIC AND PARA-AORTIC LYMPH  NODE DISSECTION N/A 01/03/2020   Procedure: XI ROBOTIC PARA-AORTIC LYMPHADENECTOMY;  Surgeon: Everitt Amber, MD;  Location: [facility];  Service: Gynecology;  Laterality: N/A;    There were no vitals filed for this visit.   Subjective Assessment - 06/13/20 1524    Subjective COVID-19 screen performed prior to patient entering clinic. Patient states her neck is doing better and her shldr is still sore    Pertinent History Ovarian cancer ("cancer-free".), DM, neuropathy, cervical disc disease, OA, HTN.    How long can you sit comfortably? Varies.    Patient Stated Goals Reduce pain and get more motion in left shoulder.  Become more active.    Currently in Pain? Yes    Pain Score 4     Pain Orientation Right;Left    Pain Descriptors / Indicators Aching;Sharp    Pain Type Chronic pain    Pain Onset More than a month ago                             [facility] Adult PT Treatment/Exercise - 06/13/20 0001      Self-Care   Self-Care Posture  Exercises   Exercises Shoulder      Shoulder Exercises: Seated   Retraction AROM   2x10 for scapular positioning for  good posture 5 sec holds   External Rotation AROM;Both   ER with 5 sec holds with scapular retraction for posture     Shoulder Exercises: Pulleys   Flexion 5 minutes      Modalities   Modalities Traction      Traction   Type of Traction Cervical    Min (lbs) 5    Max (lbs) 10    Hold Time 99    Rest Time 5    Time 15      Manual Therapy   Manual Therapy Joint mobilization;Soft tissue mobilization;Scapular mobilization;Passive ROM    Soft tissue mobilization STW and TPR  to LT UT and levator scap    Passive ROM PROM / AAROM flexion and IR / ER with end-range holds.                       PT Long Term Goals - 05/25/20 1406      PT LONG TERM GOAL #1   Title Perform ADL's with pain not > 3/10.    Time 6    Status New      PT LONG TERM GOAL #2   Title Active left shoulder flexion to 125 degrees  so the patient can easily reach overhead    Time 6    Period Weeks    Status New      PT LONG TERM GOAL #3   Title Left shoulder ER to 55 degrees.    Time 6    Period Weeks    Status New      PT LONG TERM GOAL #4   Title Bilateral active cervical rotation to 60 degrees+.    Time 6    Period Weeks    Status New                 Plan - 06/13/20 1533    Clinical Impression Statement Pt arrived today doing better with reports of decreased pain in neck and LT shldr. She was able to to perform pulleys today with decreased tightness and tolerated PROM better as well. Traction performed at 10 3s again and tolerated well    Personal Factors and Comorbidities Comorbidity 1;Other    Comorbidities Ovarian cancer ("cancer-free".), DM, neuropathy, cervical disc disease, OA, HTN.    Examination-Activity Limitations Other;Reach Overhead    Examination-Participation Restrictions Other    Stability/Clinical Decision Making Evolving/Moderate complexity           Patient will benefit from skilled therapeutic intervention in order to improve the following deficits and impairments:  Pain,Decreased activity tolerance,Decreased range of motion,Decreased strength,Increased muscle spasms,Postural dysfunction  Visit Diagnosis: Cervicalgia  Chronic left shoulder pain  Stiffness of left shoulder, not elsewhere classified     Problem List Patient Active Problem List   Diagnosis Date Noted  . Left leg pain 05/19/2020  . Statin myopathy 02/27/2020  . Ovarian cancer ([place]) 01/03/2020  . 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 Nontraumatic tear of right tibialis posterior tendon 12/14/2019  . Right ankle pain 12/07/2019  . Right ankle tendonitis 11/11/2019  . Aortic atherosclerosis ([place]) 10/12/2019  . Dehydration 10/06/2019  . Port-A-Cath in place 07/21/2019  . Genetic testing 07/15/2019  . Elevated CA-125 07/07/2019  . Family history of thyroid  cancer   . Malignant neoplasm of  both ovaries 06/20/2019  . Paronychia, finger, left 04/15/2019  . Vaginitis 04/15/2019  . Abdominal pain 04/04/2019  . Retroperitoneal mass 04/03/2019  . Acute gouty arthritis 12/17/2018  . Bilateral hearing loss 12/21/2017  . Eustachian tube disorder, bilateral 12/21/2017  . Cough 02/23/2017  . Left hand pain 05/26/2016  . Mucoid cyst, joint 05/26/2016  . Pain in finger of right hand 05/26/2016  . Primary osteoarthritis of both first carpometacarpal joints 05/26/2016  . Skin avulsion 05/26/2016  . Adhesive capsulitis of left shoulder 03/20/2016  . Fatigue 12/16/2015  . Acute bronchitis 10/31/2015  . Urinary frequency 03/09/2015  . Hearing loss, sensorineural, asymmetrical 11/09/2014  . Vestibular hypofunction, left 11/09/2014  . Obesity 07/25/2014  . Orthostatic hypotension 07/07/2014  . Upper airway cough syndrome 06/26/2014  . Diarrhea 05/02/2014  . Dizziness 04/12/2014  . Neuropathy   . Rash and nonspecific skin eruption 08/30/2013  . Paresthesia of both feet 08/30/2013  . Blurred vision 02/15/2013  . Eye pain 02/15/2013  . Sinusitis, chronic 10/07/2012  . Left shoulder pain 04/15/2012  . Encounter for well adult exam with abnormal findings 10/22/2011  . Cervical disc disease 10/22/2011  . Bilateral hand pain 08/05/2011  . SNHL (sensorineural hearing loss) 08/04/2011  . Fibrocystic breast disease 07/02/2011  . Hypertension 07/01/2011  . GERD (gastroesophageal reflux disease) 06/29/2011  . Bloating 06/29/2011  . Back pain 03/13/2011  . Vertigo 01/04/2011  . Nystagmus 01/04/2011  . Insomnia 10/21/2010  . Other constipation 08/17/2009  . Vitamin D deficiency 05/15/2009  . DYSPNEA 03/13/2009  . Depression with anxiety 01/16/2009  . Irritable bowel syndrome 01/16/2009  . Primary osteoarthritis of both hands 12/15/2008  . Diabetes mellitus type 2, noninsulin dependent ([place]) 11/03/2008  . Hyperlipidemia 11/03/2008  . Anxiety state  11/03/2008  . OVERACTIVE BLADDER 11/03/2008  . OSA (obstructive sleep apnea) 11/03/2008  . MURMUR 11/03/2008    Kavari Parrillo,CHRIS , PTA 06/13/2020, 3:51 PM  [facility] [place], [place], [address] Phone: [phone]   Fax:  [phone]  Name: Laura [name] MRN: [id number] Date of Birth: [birth year]

## 2020-06-14 ENCOUNTER — Ambulatory Visit (HOSPITAL_COMMUNITY): Payer: Medicare Other | Admitting: Hematology

## 2020-06-14 ENCOUNTER — Encounter (HOSPITAL_COMMUNITY): Payer: Self-pay

## 2020-06-14 ENCOUNTER — Encounter (HOSPITAL_COMMUNITY): Payer: Medicare Other

## 2020-06-14 ENCOUNTER — Inpatient Hospital Stay (HOSPITAL_COMMUNITY): Payer: Medicare Other | Attending: Hematology

## 2020-06-14 ENCOUNTER — Other Ambulatory Visit: Payer: Self-pay

## 2020-06-14 DIAGNOSIS — E119 Type 2 diabetes mellitus without complications: Secondary | ICD-10-CM | POA: Insufficient documentation

## 2020-06-14 DIAGNOSIS — C569 Malignant neoplasm of unspecified ovary: Secondary | ICD-10-CM

## 2020-06-14 DIAGNOSIS — I10 Essential (primary) hypertension: Secondary | ICD-10-CM | POA: Insufficient documentation

## 2020-06-14 DIAGNOSIS — Z9221 Personal history of antineoplastic chemotherapy: Secondary | ICD-10-CM | POA: Diagnosis not present

## 2020-06-14 DIAGNOSIS — M545 Low back pain, unspecified: Secondary | ICD-10-CM | POA: Diagnosis not present

## 2020-06-14 DIAGNOSIS — Z90722 Acquired absence of ovaries, bilateral: Secondary | ICD-10-CM | POA: Insufficient documentation

## 2020-06-14 DIAGNOSIS — K5900 Constipation, unspecified: Secondary | ICD-10-CM | POA: Insufficient documentation

## 2020-06-14 DIAGNOSIS — Z90710 Acquired absence of both cervix and uterus: Secondary | ICD-10-CM | POA: Diagnosis not present

## 2020-06-14 DIAGNOSIS — Z79899 Other long term (current) drug therapy: Secondary | ICD-10-CM | POA: Diagnosis not present

## 2020-06-14 DIAGNOSIS — N3281 Overactive bladder: Secondary | ICD-10-CM | POA: Insufficient documentation

## 2020-06-14 DIAGNOSIS — C563 Malignant neoplasm of bilateral ovaries: Secondary | ICD-10-CM | POA: Diagnosis not present

## 2020-06-14 DIAGNOSIS — Z9079 Acquired absence of other genital organ(s): Secondary | ICD-10-CM | POA: Diagnosis not present

## 2020-06-14 LAB — COMPREHENSIVE METABOLIC PANEL
ALT: 15 U/L (ref 0–44)
AST: 16 U/L (ref 15–41)
Albumin: 4 g/dL (ref 3.5–5.0)
Alkaline Phosphatase: 61 U/L (ref 38–126)
Anion gap: 8 (ref 5–15)
BUN: 19 mg/dL (ref 8–23)
CO2: 24 mmol/L (ref 22–32)
Calcium: 9.5 mg/dL (ref 8.9–10.3)
Chloride: 106 mmol/L (ref 98–111)
Creatinine, Ser: 1.06 mg/dL — ABNORMAL HIGH (ref 0.44–1.00)
GFR, Estimated: 55 mL/min — ABNORMAL LOW (ref >60)
Glucose, Bld: 194 mg/dL — ABNORMAL HIGH (ref 70–99)
Potassium: 3.9 mmol/L (ref 3.5–5.1)
Sodium: 138 mmol/L (ref 135–145)
Total Bilirubin: 0.6 mg/dL (ref 0.3–1.2)
Total Protein: 7 g/dL (ref 6.5–8.1)

## 2020-06-14 LAB — CBC WITH DIFFERENTIAL/PLATELET
Abs Immature Granulocytes: 0.01 10*3/uL (ref 0.00–0.07)
Basophils Absolute: 0 10*3/uL (ref 0.0–0.1)
Basophils Relative: 1 %
Eosinophils Absolute: 0.1 10*3/uL (ref 0.0–0.5)
Eosinophils Relative: 1 %
HCT: 34.2 % — ABNORMAL LOW (ref 36.0–46.0)
Hemoglobin: 11.3 g/dL — ABNORMAL LOW (ref 12.0–15.0)
Immature Granulocytes: 0 %
Lymphocytes Relative: 27 %
Lymphs Abs: 1.7 10*3/uL (ref 0.7–4.0)
MCH: 30.4 pg (ref 26.0–34.0)
MCHC: 33 g/dL (ref 30.0–36.0)
MCV: 91.9 fL (ref 80.0–100.0)
Monocytes Absolute: 0.4 10*3/uL (ref 0.1–1.0)
Monocytes Relative: 6 %
Neutro Abs: 4.3 10*3/uL (ref 1.7–7.7)
Neutrophils Relative %: 65 %
Platelets: 198 10*3/uL (ref 150–400)
RBC: 3.72 MIL/uL — ABNORMAL LOW (ref 3.87–5.11)
RDW: 13.5 % (ref 11.5–15.5)
WBC: 6.5 10*3/uL (ref 4.0–10.5)
nRBC: 0 % (ref 0.0–0.2)

## 2020-06-14 MED ORDER — HEPARIN SOD (PORK) LOCK FLUSH 100 UNIT/ML IV SOLN
500.0000 [IU] | Freq: Once | INTRAVENOUS | Status: AC
  Administered 2020-06-14: 500 [IU] via INTRAVENOUS

## 2020-06-14 MED ORDER — SODIUM CHLORIDE 0.9% FLUSH
10.0000 mL | Freq: Once | INTRAVENOUS | Status: AC
Start: 2020-06-14 — End: 2020-06-14
  Administered 2020-06-14: 10 mL via INTRAVENOUS

## 2020-06-14 NOTE — Progress Notes (Signed)
Labs were drawn and Port flushed with good blood return noted. No bruising or swelling at site. Bandaid applied and patient discharged in satisfactory condition. VVS stable with no signs or symptoms of distressed noted.

## 2020-06-14 NOTE — Patient Instructions (Signed)
[facility]  Discharge Instructions: Thank you for choosing [facility] to provide your oncology and hematology care.  If you have a lab appointment with the [facility], please come in thru the Main Entrance and check in at the main information desk.  Wear comfortable clothing and clothing appropriate for easy access to any Portacath or PICC line.   We strive to give you quality time with your provider. You may need to reschedule your appointment if you arrive late (15 or more minutes).  Arriving late affects you and other patients whose appointments are after yours.  Also, if you miss three or more appointments without notifying the office, you may be dismissed from the clinic at the provider's discretion.      For prescription refill requests, have your pharmacy contact our office and allow 72 hours for refills to be completed.    Today your port was flushed and labs were drawn return as scheduled.   To help prevent nausea and vomiting after your treatment, we encourage you to take your nausea medication as directed.  BELOW ARE SYMPTOMS THAT SHOULD BE REPORTED IMMEDIATELY: . *FEVER GREATER THAN 100.4 F (38 C) OR HIGHER . *CHILLS OR SWEATING . *NAUSEA AND VOMITING THAT IS NOT CONTROLLED WITH YOUR NAUSEA MEDICATION . *UNUSUAL SHORTNESS OF BREATH . *UNUSUAL BRUISING OR BLEEDING . *URINARY PROBLEMS (pain or burning when urinating, or frequent urination) . *BOWEL PROBLEMS (unusual diarrhea, constipation, pain near the anus) . TENDERNESS IN MOUTH AND THROAT WITH OR WITHOUT PRESENCE OF ULCERS (sore throat, sores in mouth, or a toothache) . UNUSUAL RASH, SWELLING OR PAIN  . UNUSUAL VAGINAL DISCHARGE OR ITCHING   Items with * indicate a potential emergency and should be followed up as soon as possible or go to the Emergency Department if any problems should occur.  Please show the CHEMOTHERAPY ALERT CARD or IMMUNOTHERAPY ALERT CARD at check-in to the Emergency  Department and triage nurse.  Should you have questions after your visit or need to cancel or reschedule your appointment, please contact [facility] [phone]  and follow the prompts.  Office hours are 8:00 a.m. to 4:30 p.m. Monday - Friday. Please note that voicemails left after 4:00 p.m. may not be returned until the following business day.  We are closed weekends and major holidays. You have access to a nurse at all times for urgent questions. Please call the main number to the clinic [phone] and follow the prompts.  For any non-urgent questions, you may also contact your provider using MyChart. We now offer e-Visits for anyone 48 and older to request care online for non-urgent symptoms. For details visit [email or web address].   Also download the MyChart app! Go to the app store, search "MyChart", open the app, select [facility], and log in with your MyChart username and password.  Due to Covid, a mask is required upon entering the hospital/clinic. If you do not have a mask, one will be given to you upon arrival. For doctor visits, patients may have 1 support person aged 76 or older with them. For treatment visits, patients cannot have anyone with them due to current Covid guidelines and our immunocompromised population.

## 2020-06-15 ENCOUNTER — Other Ambulatory Visit: Payer: Self-pay

## 2020-06-15 ENCOUNTER — Ambulatory Visit: Payer: Medicare Other

## 2020-06-15 DIAGNOSIS — G8929 Other chronic pain: Secondary | ICD-10-CM

## 2020-06-15 DIAGNOSIS — M25512 Pain in left shoulder: Secondary | ICD-10-CM | POA: Diagnosis not present

## 2020-06-15 DIAGNOSIS — M25612 Stiffness of left shoulder, not elsewhere classified: Secondary | ICD-10-CM | POA: Diagnosis not present

## 2020-06-15 DIAGNOSIS — M542 Cervicalgia: Secondary | ICD-10-CM

## 2020-06-15 NOTE — Therapy (Signed)
[facility] [place], [place], [address] Phone: [phone]   Fax:  [phone]  Physical Therapy Treatment  Patient Details  Name: Laura [name] MRN: [id number] Date of Birth: 1945-06-23 Referring Provider (PT): Basil Dess MD   Encounter Date: 06/15/2020   PT End of Session - 06/15/20 1030    Visit Number 6    Number of Visits 12    Date for PT Re-Evaluation 08/23/20    Authorization Type FOTO AT LEAST EVERY 5TH VISIT.  PROGRESS NOTE AT 10TH VISIT.  KX MODIFIER AFTER 15 VISITS.    PT Start Time 0945    PT Stop Time 1030    PT Time Calculation (min) 45 min    Equipment Utilized During Treatment Other (comment)    Activity Tolerance Patient tolerated treatment well    Behavior During Therapy WFL for tasks assessed/performed           Past Medical History:  Diagnosis Date  . Anemia   . Anxiety   . Arthritis    back of neck, bones spurs on neck  . Cancer ([place])    ovarian cancer  . Cataract   . Cervical disc disease   . Diabetes mellitus   . Family history of thyroid cancer   . Heart murmur   . Hyperlipidemia   . Hypertension   . Mucoid cyst of joint    right thumb  . Neuropathy   . Port-A-Cath in place 07/21/2019  . Reflux   . Sleep apnea    wears CPAP nightly  . Vertigo     Past Surgical History:  Procedure Laterality Date  . ABDOMINAL HYSTERECTOMY     vaginal  . BLADDER SURGERY    . BREAST EXCISIONAL BIOPSY Bilateral   . BREAST SURGERY    . CHOLECYSTECTOMY    . fibroids removed     breast (both breasts)  . IR IMAGING GUIDED PORT INSERTION  07/26/2019   right  . MASS EXCISION Right 06/26/2016   Procedure: EXCISION MUCOID TUMOR RIGHT THUMB, IP RIGHT THUMB;  Surgeon: Daryll Brod, MD;  Location: [facility];  Service: Orthopedics;  Laterality: Right;  . PARTIAL HYSTERECTOMY    . ROBOTIC ASSISTED BILATERAL SALPINGO OOPHERECTOMY N/A 01/03/2020   Procedure: XI ROBOTIC ASSISTED BILATERAL  SALPINGO OOPHORECTOMY, RADICAL TUMOR DEBULKING;  Surgeon: Everitt Amber, MD;  Location: [facility];  Service: Gynecology;  Laterality: N/A;  . ROBOTIC PELVIC AND PARA-AORTIC LYMPH NODE DISSECTION N/A 01/03/2020   Procedure: XI ROBOTIC PARA-AORTIC LYMPHADENECTOMY;  Surgeon: Everitt Amber, MD;  Location: [facility];  Service: Gynecology;  Laterality: N/A;    There were no vitals filed for this visit.   Subjective Assessment - 06/15/20 1016    Subjective COVID-19 screen performed prior to patient entering clinic. Patient states that she has been getting better. She has a pain in the top of her L shoulder and neck.    Pertinent History Ovarian cancer ("cancer-free".), DM, neuropathy, cervical disc disease, OA, HTN.    How long can you sit comfortably? Varies.    Patient Stated Goals Reduce pain and get more motion in left shoulder.  Become more active.    Pain Onset More than a month ago                             [facility] Adult PT Treatment/Exercise - 06/15/20 0945      Posture/Postural Control   Posture/Postural Control  Postural limitations    Postural Limitations Rounded Shoulders;Forward head      Exercises   Exercises Shoulder      Shoulder Exercises: Supine   Protraction AROM    Other Supine Exercises HABD with green tband    Other Supine Exercises chin tucks      Shoulder Exercises: Seated   Retraction AROM      Manual Therapy   Manual Therapy Joint mobilization;Soft tissue mobilization;Scapular mobilization;Passive ROM    Manual therapy comments Improved AROM shulder flexion ; inproved ER to be able to reach behind her head - subjectively pt reports feeling manual therapy felt that it helped her a lot    Joint Mobilization anterior-posterior GHJ mob gr III    Soft tissue mobilization STW and TPR  to LT UT and levator scap    Scapular Mobilization scapular blocking with elevation of the LUE; medial glide    Passive ROM PROM / AAROM flexion and IR / ER with end-range  holds.                       PT Long Term Goals - 05/25/20 1406      PT LONG TERM GOAL #1   Title Perform ADL's with pain not > 3/10.    Time 6    Status New      PT LONG TERM GOAL #2   Title Active left shoulder flexion to 125 degrees so the patient can easily reach overhead    Time 6    Period Weeks    Status New      PT LONG TERM GOAL #3   Title Left shoulder ER to 55 degrees.    Time 6    Period Weeks    Status New      PT LONG TERM GOAL #4   Title Bilateral active cervical rotation to 60 degrees+.    Time 6    Period Weeks    Status New                 Plan - 06/15/20 1142    Clinical Impression Statement Continued skilled PT recommende das patient has improved with tolerance to abduction and scaption with less excessive scapular elevation noted. Pin and stretch, isometric shoulder elevation in supine, and P/ROM into rotation all implemented to benefit patient ROM. Plan to ocntinue per plan of care.    Personal Factors and Comorbidities Comorbidity 1;Other    Comorbidities Ovarian cancer ("cancer-free".), DM, neuropathy, cervical disc disease, OA, HTN.    Examination-Activity Limitations Other;Reach Overhead    Examination-Participation Restrictions Other    Stability/Clinical Decision Making Evolving/Moderate complexity    Clinical Decision Making Low    Rehab Potential Good    PT Frequency 2x / week    PT Duration 6 weeks    PT Treatment/Interventions ADLs/Self Care Home Management;Cryotherapy;Traction;Moist Heat;Functional mobility training;Therapeutic activities;Therapeutic exercise;Manual techniques;Patient/family education;Passive range of motion           Patient will benefit from skilled therapeutic intervention in order to improve the following deficits and impairments:  Pain,Decreased activity tolerance,Decreased range of motion,Decreased strength,Increased muscle spasms,Postural dysfunction  Visit Diagnosis: Cervicalgia  Chronic  left shoulder pain  Stiffness of left shoulder, not elsewhere classified     Problem List Patient Active Problem List   Diagnosis Date Noted  . Left leg pain 05/19/2020  . Statin myopathy 02/27/2020  . Ovarian cancer ([place]) 01/03/2020  . 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 Nontraumatic tear of right tibialis posterior tendon 12/14/2019  . Right ankle pain 12/07/2019  . Right ankle tendonitis 11/11/2019  . Aortic atherosclerosis ([place]) 10/12/2019  . Dehydration 10/06/2019  . Port-A-Cath in place 07/21/2019  . Genetic testing 07/15/2019  . Elevated CA-125 07/07/2019  . Family history of thyroid cancer   . Malignant neoplasm of both ovaries 06/20/2019  . Paronychia, finger, left 04/15/2019  . Vaginitis 04/15/2019  . Abdominal pain 04/04/2019  . Retroperitoneal mass 04/03/2019  . Acute gouty arthritis 12/17/2018  . Bilateral hearing loss 12/21/2017  . Eustachian tube disorder, bilateral 12/21/2017  . Cough 02/23/2017  . Left hand pain 05/26/2016  . Mucoid cyst, joint 05/26/2016  . Pain in finger of right hand 05/26/2016  . Primary osteoarthritis of both first carpometacarpal joints 05/26/2016  . Skin avulsion 05/26/2016  . Adhesive capsulitis of left shoulder 03/20/2016  . Fatigue 12/16/2015  . Acute bronchitis 10/31/2015  . Urinary frequency 03/09/2015  . Hearing loss, sensorineural, asymmetrical 11/09/2014  . Vestibular hypofunction, left 11/09/2014  . Obesity 07/25/2014  . Orthostatic hypotension 07/07/2014  . Upper airway cough syndrome 06/26/2014  . Diarrhea 05/02/2014  . Dizziness 04/12/2014  . Neuropathy   . Rash and nonspecific skin eruption 08/30/2013  . Paresthesia of both feet 08/30/2013  . Blurred vision 02/15/2013  . Eye pain 02/15/2013  . Sinusitis, chronic 10/07/2012  . Left shoulder pain 04/15/2012  . Encounter for well adult exam with abnormal findings 10/22/2011  . Cervical disc disease 10/22/2011  .  Bilateral hand pain 08/05/2011  . SNHL (sensorineural hearing loss) 08/04/2011  . Fibrocystic breast disease 07/02/2011  . Hypertension 07/01/2011  . GERD (gastroesophageal reflux disease) 06/29/2011  . Bloating 06/29/2011  . Back pain 03/13/2011  . Vertigo 01/04/2011  . Nystagmus 01/04/2011  . Insomnia 10/21/2010  . Other constipation 08/17/2009  . Vitamin D deficiency 05/15/2009  . DYSPNEA 03/13/2009  . Depression with anxiety 01/16/2009  . Irritable bowel syndrome 01/16/2009  . Primary osteoarthritis of both hands 12/15/2008  . Diabetes mellitus type 2, noninsulin dependent ([place]) 11/03/2008  . Hyperlipidemia 11/03/2008  . Anxiety state 11/03/2008  . OVERACTIVE BLADDER 11/03/2008  . OSA (obstructive sleep apnea) 11/03/2008  . MURMUR 11/03/2008    [place], DPT 06/15/2020, 11:45 AM  [facility] [address] [place], [place], [address] Phone: [phone]   Fax:  [phone]  Name: ALICHA RASPBERRY MRN: [id number] Date of Birth: November 09, 1945

## 2020-06-18 ENCOUNTER — Other Ambulatory Visit: Payer: Self-pay

## 2020-06-18 ENCOUNTER — Ambulatory Visit (INDEPENDENT_AMBULATORY_CARE_PROVIDER_SITE_OTHER): Payer: Medicare Other | Admitting: Specialist

## 2020-06-18 ENCOUNTER — Encounter: Payer: Self-pay | Admitting: Specialist

## 2020-06-18 ENCOUNTER — Other Ambulatory Visit (HOSPITAL_COMMUNITY): Payer: Self-pay

## 2020-06-18 VITALS — BP 131/80 | HR 84 | Ht 64.0 in | Wt 192.0 lb

## 2020-06-18 DIAGNOSIS — M47812 Spondylosis without myelopathy or radiculopathy, cervical region: Secondary | ICD-10-CM

## 2020-06-18 DIAGNOSIS — M76821 Posterior tibial tendinitis, right leg: Secondary | ICD-10-CM | POA: Diagnosis not present

## 2020-06-18 NOTE — Progress Notes (Signed)
Office Visit Note   Patient: Laura [name]           Date of Birth: October 31, 1945           MRN: [id number] Visit Date: 06/18/2020              Requested by: Biagio Borg, MD [address] [place],  [place] [address] PCP: Biagio Borg, MD   Assessment & Plan: Visit Diagnoses:  1. Posterior tibial tendonitis, right   2. Spondylosis without myelopathy or radiculopathy, cervical region     Plan: Avoid overhead lifting and overhead use of the arms. Pillows to keep from sleeping directly on the shoulders Limited lifting to less than 10 lbs. Ice or heat for relief. NSAIDs are helpful, such as alleve or motrin, be careful not to use in excess as they place burdens on the kidney. Stretching exercise help and strengthening is helpful to build endurance. ROM, stretching exercises of the shoulders. Cervical spine ROM exercises and isometric strengthening. Use a good pair of tennis shoes with good arch support.  Follow-Up Instructions: Return in about 6 weeks (around 07/30/2020).   Orders:  No orders of the defined types were placed in this encounter.  No orders of the defined types were placed in this encounter.     Procedures: No procedures performed   Clinical Data: No additional findings.   Subjective: Chief Complaint  Patient presents with  . Neck - Follow-up    75 year old female with history of cervical spondylosis and underwent PT and this is better. Concerned about right posterior medial ankle pain after being on an antibiotic this past August after taking Ciprofoxacilin. SHe reports she has completed PT and it is some better.    Review of Systems  Constitutional: Negative.   HENT: Negative.   Eyes: Negative.   Respiratory: Negative.   Cardiovascular: Negative.   Gastrointestinal: Negative.   Endocrine: Negative.   Genitourinary: Negative.   Musculoskeletal: Negative.   Skin: Negative.   Allergic/Immunologic: Negative.   Neurological: Negative.    Hematological: Negative.   Psychiatric/Behavioral: Negative.      Objective: Vital Signs: BP 131/80 (BP Location: Left Arm, Patient Position: Sitting)   Pulse 84   Ht 5\' 4"  (1.626 m)   Wt 192 lb (87.1 kg)   BMI 32.96 kg/m   Physical Exam  Right Ankle Exam   Tenderness  The patient is experiencing tenderness in the medial malleolus. Swelling: mild  Range of Motion  Dorsiflexion: normal  Plantar flexion: normal  Eversion: normal  Inversion: normal   Muscle Strength  Dorsiflexion:  5/5 Plantar flexion:  5/5 Anterior tibial:  5/5 Posterior tibial:  5/5 Gastrocsoleus:  5/5 Peroneal muscle:  5/5  Tests  Anterior drawer: positive Varus tilt: negative  Other  Erythema: absent Scars: absent Sensation: normal Pulse: present   Comments:  Tender along the right posterior tibial tendon just posteromedial to the right medial malleolus.    Back Exam   Tenderness  The patient is experiencing tenderness in the cervical.  Range of Motion  Extension: abnormal  Flexion: abnormal  Lateral bend right: abnormal  Lateral bend left: abnormal  Rotation right: abnormal  Rotation left: abnormal       Specialty Comments:  No specialty comments available.  Imaging: No results found.   PMFS History: Patient Active Problem List   Diagnosis Date Noted  . Left leg pain 05/19/2020  . Statin myopathy 02/27/2020  . Ovarian cancer ([place]) 01/03/2020  .  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 Nontraumatic tear of right tibialis posterior tendon 12/14/2019  . Right ankle pain 12/07/2019  . Right ankle tendonitis 11/11/2019  . Aortic atherosclerosis ([place]) 10/12/2019  . Dehydration 10/06/2019  . Port-A-Cath in place 07/21/2019  . Genetic testing 07/15/2019  . Elevated CA-125 07/07/2019  . Family history of thyroid cancer   . Malignant neoplasm of both ovaries 06/20/2019  . Paronychia, finger, left 04/15/2019  . Vaginitis  04/15/2019  . Abdominal pain 04/04/2019  . Retroperitoneal mass 04/03/2019  . Acute gouty arthritis 12/17/2018  . Bilateral hearing loss 12/21/2017  . Eustachian tube disorder, bilateral 12/21/2017  . Cough 02/23/2017  . Left hand pain 05/26/2016  . Mucoid cyst, joint 05/26/2016  . Pain in finger of right hand 05/26/2016  . Primary osteoarthritis of both first carpometacarpal joints 05/26/2016  . Skin avulsion 05/26/2016  . Adhesive capsulitis of left shoulder 03/20/2016  . Fatigue 12/16/2015  . Acute bronchitis 10/31/2015  . Urinary frequency 03/09/2015  . Hearing loss, sensorineural, asymmetrical 11/09/2014  . Vestibular hypofunction, left 11/09/2014  . Obesity 07/25/2014  . Orthostatic hypotension 07/07/2014  . Upper airway cough syndrome 06/26/2014  . Diarrhea 05/02/2014  . Dizziness 04/12/2014  . Neuropathy   . Rash and nonspecific skin eruption 08/30/2013  . Paresthesia of both feet 08/30/2013  . Blurred vision 02/15/2013  . Eye pain 02/15/2013  . Sinusitis, chronic 10/07/2012  . Left shoulder pain 04/15/2012  . Encounter for well adult exam with abnormal findings 10/22/2011  . Cervical disc disease 10/22/2011  . Bilateral hand pain 08/05/2011  . SNHL (sensorineural hearing loss) 08/04/2011  . Fibrocystic breast disease 07/02/2011  . Hypertension 07/01/2011  . GERD (gastroesophageal reflux disease) 06/29/2011  . Bloating 06/29/2011  . Back pain 03/13/2011  . Vertigo 01/04/2011  . Nystagmus 01/04/2011  . Insomnia 10/21/2010  . Other constipation 08/17/2009  . Vitamin D deficiency 05/15/2009  . DYSPNEA 03/13/2009  . Depression with anxiety 01/16/2009  . Irritable bowel syndrome 01/16/2009  . Primary osteoarthritis of both hands 12/15/2008  . Diabetes mellitus type 2, noninsulin dependent ([place]) 11/03/2008  . Hyperlipidemia 11/03/2008  . Anxiety state 11/03/2008  . OVERACTIVE BLADDER 11/03/2008  . OSA (obstructive sleep apnea) 11/03/2008  . MURMUR 11/03/2008    Past Medical History:  Diagnosis Date  . Anemia   . Anxiety   . Arthritis    back of neck, bones spurs on neck  . Cancer ([place])    ovarian cancer  . Cataract   . Cervical disc disease   . Diabetes mellitus   . Family history of thyroid cancer   . Heart murmur   . Hyperlipidemia   . Hypertension   . Mucoid cyst of joint    right thumb  . Neuropathy   . Port-A-Cath in place 07/21/2019  . Reflux   . Sleep apnea    wears CPAP nightly  . Vertigo     Family History  Problem Relation Age of Onset  . Hypertension Other   . Diabetes Father   . Hypertension Father   . Diabetes Sister   . Hypertension Mother   . Hypertension Sister   . Stroke Sister   . Diabetes Maternal Aunt   . Thyroid cancer Daughter 56  . Colon cancer Neg Hx   . Esophageal cancer Neg Hx   . Stomach cancer Neg Hx   . Rectal cancer Neg Hx   . Breast cancer Neg Hx   . Endometrial  cancer Neg Hx   . Ovarian cancer Neg Hx     Past Surgical History:  Procedure Laterality Date  . ABDOMINAL HYSTERECTOMY     vaginal  . BLADDER SURGERY    . BREAST EXCISIONAL BIOPSY Bilateral   . BREAST SURGERY    . CHOLECYSTECTOMY    . fibroids removed     breast (both breasts)  . IR IMAGING GUIDED PORT INSERTION  07/26/2019   right  . MASS EXCISION Right 06/26/2016   Procedure: EXCISION MUCOID TUMOR RIGHT THUMB, IP RIGHT THUMB;  Surgeon: Daryll Brod, MD;  Location: [facility];  Service: Orthopedics;  Laterality: Right;  . PARTIAL HYSTERECTOMY    . ROBOTIC ASSISTED BILATERAL SALPINGO OOPHERECTOMY N/A 01/03/2020   Procedure: XI ROBOTIC ASSISTED BILATERAL SALPINGO OOPHORECTOMY, RADICAL TUMOR DEBULKING;  Surgeon: Everitt Amber, MD;  Location: [facility];  Service: Gynecology;  Laterality: N/A;  . ROBOTIC PELVIC AND PARA-AORTIC LYMPH NODE DISSECTION N/A 01/03/2020   Procedure: XI ROBOTIC PARA-AORTIC LYMPHADENECTOMY;  Surgeon: Everitt Amber, MD;  Location: [facility];  Service: Gynecology;  Laterality: N/A;   Social History    Occupational History  . Occupation: retired Geographical information systems officer  Tobacco Use  . Smoking status: Never Smoker  . Smokeless tobacco: Never Used  Vaping Use  . Vaping Use: Never used  Substance and Sexual Activity  . Alcohol use: No    Alcohol/week: 0.0 standard drinks  . Drug use: No  . Sexual activity: Not Currently

## 2020-06-18 NOTE — Patient Instructions (Signed)
Avoid overhead lifting and overhead use of the arms. Pillows to keep from sleeping directly on the shoulders Limited lifting to less than 10 lbs. Ice or heat for relief. NSAIDs are helpful, such as alleve or motrin, be careful not to use in excess as they place burdens on the kidney. Stretching exercise help and strengthening is helpful to build endurance. ROM, stretching exercises of the shoulders. Cervical spine ROM exercises and isometric strengthening. Use a good pair of tennis shoes with good arch support.

## 2020-06-19 ENCOUNTER — Ambulatory Visit: Payer: Medicare Other | Admitting: Physical Therapy

## 2020-06-19 ENCOUNTER — Other Ambulatory Visit: Payer: Self-pay

## 2020-06-19 DIAGNOSIS — M542 Cervicalgia: Secondary | ICD-10-CM

## 2020-06-19 DIAGNOSIS — M25612 Stiffness of left shoulder, not elsewhere classified: Secondary | ICD-10-CM | POA: Diagnosis not present

## 2020-06-19 DIAGNOSIS — M25512 Pain in left shoulder: Secondary | ICD-10-CM | POA: Diagnosis not present

## 2020-06-19 DIAGNOSIS — G8929 Other chronic pain: Secondary | ICD-10-CM | POA: Diagnosis not present

## 2020-06-19 NOTE — Therapy (Signed)
[facility] [place], [place], [address] Phone: [phone]   Fax:  [phone]  Physical Therapy Treatment  Patient Details  Name: Laura [name] MRN: [id number] Date of Birth: Jun 02, 1945 Referring Provider (PT): Basil Dess MD   Encounter Date: 06/19/2020   PT End of Session - 06/19/20 0952    Visit Number 7    Number of Visits 12    Date for PT Re-Evaluation 08/23/20    Authorization Type FOTO AT LEAST EVERY 5TH VISIT.  PROGRESS NOTE AT 10TH VISIT.  KX MODIFIER AFTER 15 VISITS.    PT Start Time [phone]    PT Stop Time 1042    PT Time Calculation (min) 55 min    Activity Tolerance Patient tolerated treatment well    Behavior During Therapy WFL for tasks assessed/performed           Past Medical History:  Diagnosis Date  . Anemia   . Anxiety   . Arthritis    back of neck, bones spurs on neck  . Cancer ([place])    ovarian cancer  . Cataract   . Cervical disc disease   . Diabetes mellitus   . Family history of thyroid cancer   . Heart murmur   . Hyperlipidemia   . Hypertension   . Mucoid cyst of joint    right thumb  . Neuropathy   . Port-A-Cath in place 07/21/2019  . Reflux   . Sleep apnea    wears CPAP nightly  . Vertigo     Past Surgical History:  Procedure Laterality Date  . ABDOMINAL HYSTERECTOMY     vaginal  . BLADDER SURGERY    . BREAST EXCISIONAL BIOPSY Bilateral   . BREAST SURGERY    . CHOLECYSTECTOMY    . fibroids removed     breast (both breasts)  . IR IMAGING GUIDED PORT INSERTION  07/26/2019   right  . MASS EXCISION Right 06/26/2016   Procedure: EXCISION MUCOID TUMOR RIGHT THUMB, IP RIGHT THUMB;  Surgeon: Daryll Brod, MD;  Location: [facility];  Service: Orthopedics;  Laterality: Right;  . PARTIAL HYSTERECTOMY    . ROBOTIC ASSISTED BILATERAL SALPINGO OOPHERECTOMY N/A 01/03/2020   Procedure: XI ROBOTIC ASSISTED BILATERAL SALPINGO OOPHORECTOMY, RADICAL TUMOR DEBULKING;  Surgeon:  Everitt Amber, MD;  Location: [facility];  Service: Gynecology;  Laterality: N/A;  . ROBOTIC PELVIC AND PARA-AORTIC LYMPH NODE DISSECTION N/A 01/03/2020   Procedure: XI ROBOTIC PARA-AORTIC LYMPHADENECTOMY;  Surgeon: Everitt Amber, MD;  Location: [facility];  Service: Gynecology;  Laterality: N/A;    There were no vitals filed for this visit.   Subjective Assessment - 06/19/20 0949    Subjective COVID-19 screen performed prior to patient entering clinic. Saw Dr. Louanne Skye yesterday and he is very pleased with her progress. She reports bil shoulder pain today. She had pain on Friday but lying down not when upright and in bil shoulders.    Pertinent History Ovarian cancer ("cancer-free".), DM, neuropathy, cervical disc disease, OA, HTN.    Patient Stated Goals Reduce pain and get more motion in left shoulder.  Become more active.    Currently in Pain? Yes    Pain Score 3     Pain Location Shoulder    Pain Orientation Right;Left    Pain Descriptors / Indicators Sore    Pain Type Chronic pain  [place] Adult PT Treatment/Exercise - 06/19/20 0001      Shoulder Exercises: Sidelying   Other Sidelying Exercises open book bil x 5 to stretch pectorals      Shoulder Exercises: Standing   Protraction Limitations painful    External Rotation AROM;Both;20 reps    External Rotation Limitations using towel roll along spine; attempted W position but too painful      Shoulder Exercises: Pulleys   Flexion 5 minutes      Traction   Type of Traction Cervical    Min (lbs) 5    Max (lbs) 10    Hold Time 90    Rest Time 5    Time 15      Manual Therapy   Manual therapy comments Patient reporting marked relief after manual therapy    Soft tissue mobilization to left UT, infraspinatus and subscapularis with TPR with active shoulder motion                       PT Long Term Goals - 05/25/20 1406      PT LONG TERM GOAL #1   Title Perform ADL's with pain not  > 3/10.    Time 6    Status New      PT LONG TERM GOAL #2   Title Active left shoulder flexion to 125 degrees so the patient can easily reach overhead    Time 6    Period Weeks    Status New      PT LONG TERM GOAL #3   Title Left shoulder ER to 55 degrees.    Time 6    Period Weeks    Status New      PT LONG TERM GOAL #4   Title Bilateral active cervical rotation to 60 degrees+.    Time 6    Period Weeks    Status New                 Plan - 06/19/20 1032    Clinical Impression Statement Patient presents with increased pain in bil shoulders today since Friday. She saw MD who was very pleased with her progress. She complained of anterior tightness so we did some pectoral stretching using open books. She has marked tenderness and trigger points in her left infraspinatus and subscapularis which responded very well to manual therapy. she would benefit from DN to these areas as well. PT suggested self massage with ball as alternative as well. Returned to traction today due to return of shoulder sx with good response.    Personal Factors and Comorbidities Comorbidity 1;Other    Comorbidities Ovarian cancer ("cancer-free".), DM, neuropathy, cervical disc disease, OA, HTN.    PT Treatment/Interventions ADLs/Self Care Home Management;Cryotherapy;Traction;Moist Heat;Functional mobility training;Therapeutic activities;Therapeutic exercise;Manual techniques;Patient/family education;Passive range of motion    PT Next Visit Plan consider DN to left scapular region (would need recert), else continue releasing infraspinatus and subscapularis to help improve ROM and decrease pain and tension.           Patient will benefit from skilled therapeutic intervention in order to improve the following deficits and impairments:  Pain,Decreased activity tolerance,Decreased range of motion,Decreased strength,Increased muscle spasms,Postural dysfunction  Visit Diagnosis: Cervicalgia  Chronic left  shoulder pain  Stiffness of left shoulder, not elsewhere classified     Problem List Patient Active Problem List   Diagnosis Date Noted  . Left leg pain 05/19/2020  . Statin myopathy 02/27/2020  . Ovarian cancer ([place])  01/03/2020  . 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 Nontraumatic tear of right tibialis posterior tendon 12/14/2019  . Right ankle pain 12/07/2019  . Right ankle tendonitis 11/11/2019  . Aortic atherosclerosis ([place]) 10/12/2019  . Dehydration 10/06/2019  . Port-A-Cath in place 07/21/2019  . Genetic testing 07/15/2019  . Elevated CA-125 07/07/2019  . Family history of thyroid cancer   . Malignant neoplasm of both ovaries 06/20/2019  . Paronychia, finger, left 04/15/2019  . Vaginitis 04/15/2019  . Abdominal pain 04/04/2019  . Retroperitoneal mass 04/03/2019  . Acute gouty arthritis 12/17/2018  . Bilateral hearing loss 12/21/2017  . Eustachian tube disorder, bilateral 12/21/2017  . Cough 02/23/2017  . Left hand pain 05/26/2016  . Mucoid cyst, joint 05/26/2016  . Pain in finger of right hand 05/26/2016  . Primary osteoarthritis of both first carpometacarpal joints 05/26/2016  . Skin avulsion 05/26/2016  . Adhesive capsulitis of left shoulder 03/20/2016  . Fatigue 12/16/2015  . Acute bronchitis 10/31/2015  . Urinary frequency 03/09/2015  . Hearing loss, sensorineural, asymmetrical 11/09/2014  . Vestibular hypofunction, left 11/09/2014  . Obesity 07/25/2014  . Orthostatic hypotension 07/07/2014  . Upper airway cough syndrome 06/26/2014  . Diarrhea 05/02/2014  . Dizziness 04/12/2014  . Neuropathy   . Rash and nonspecific skin eruption 08/30/2013  . Paresthesia of both feet 08/30/2013  . Blurred vision 02/15/2013  . Eye pain 02/15/2013  . Sinusitis, chronic 10/07/2012  . Left shoulder pain 04/15/2012  . Encounter for well adult exam with abnormal findings 10/22/2011  . Cervical disc disease 10/22/2011  . Bilateral  hand pain 08/05/2011  . SNHL (sensorineural hearing loss) 08/04/2011  . Fibrocystic breast disease 07/02/2011  . Hypertension 07/01/2011  . GERD (gastroesophageal reflux disease) 06/29/2011  . Bloating 06/29/2011  . Back pain 03/13/2011  . Vertigo 01/04/2011  . Nystagmus 01/04/2011  . Insomnia 10/21/2010  . Other constipation 08/17/2009  . Vitamin D deficiency 05/15/2009  . DYSPNEA 03/13/2009  . Depression with anxiety 01/16/2009  . Irritable bowel syndrome 01/16/2009  . Primary osteoarthritis of both hands 12/15/2008  . Diabetes mellitus type 2, noninsulin dependent ([place]) 11/03/2008  . Hyperlipidemia 11/03/2008  . Anxiety state 11/03/2008  . OVERACTIVE BLADDER 11/03/2008  . OSA (obstructive sleep apnea) 11/03/2008  . MURMUR 11/03/2008    Madelyn Flavors PT 06/19/2020, 10:38 AM  [facility] [address] [place], [place], [address] Phone: [phone]   Fax:  [phone]  Name: CECEILIA CEPHUS MRN: [id number] Date of Birth: 03/20/1945

## 2020-06-20 NOTE — Progress Notes (Signed)
[place], [place] [address]   CLINIC:  Medical Oncology/Hematology  PCP:  Biagio Borg, MD [place] [facility] / [place] [address] [phone]   REASON FOR VISIT:  Follow-up for ovarian cancer  PRIOR THERAPY:  1. Carboplatin, paclitaxel and Aloxi x 6 cycles from 07/28/2019 to 11/17/2019. 2. Robotic assisted BSO with tumor debulking on 01/03/2020.  NGS Results: Foundation 1 MS--stable, TMB 0 Muts/Mb  CURRENT THERAPY: surveillance  BRIEF ONCOLOGIC HISTORY:  Oncology History  Malignant neoplasm of both ovaries  06/20/2019 Initial Diagnosis   Primary ovarian adenocarcinoma, unspecified laterality ([facility])   07/01/2019 Genetic Testing   Foundation One     07/11/2019 Genetic Testing   Negative genetic testing:  No pathogenic variants detected on the [facility] Multi-Cancer Panel. The report date is 07/11/2019.  The Multi-Cancer Panel offered by [facility] includes sequencing and/or deletion duplication testing of the following 85 genes: AIP, ALK, APC, ATM, AXIN2,BAP1,  BARD1, BLM, BMPR1A, BRCA1, BRCA2, BRIP1, CASR, CDC73, CDH1, CDK4, CDKN1B, CDKN1C, CDKN2A (p14ARF), CDKN2A (p16INK4a), CEBPA, CHEK2, CTNNA1, DICER1, DIS3L2, EGFR (c.2369C>T, p.Thr790Met variant only), EPCAM (Deletion/duplication testing only), FH, FLCN, GATA2, GPC3, GREM1 (Promoter region deletion/duplication testing only), HOXB13 (c.251G>A, p.Gly84Glu), HRAS, KIT, MAX, MEN1, MET, MITF (c.952G>A, p.Glu318Lys variant only), MLH1, MSH2, MSH3, MSH6, MUTYH, NBN, NF1, NF2, NTHL1, PALB2, PDGFRA, PHOX2B, PMS2, POLD1, POLE, POT1, PRKAR1A, PTCH1, PTEN, RAD50, RAD51C, RAD51D, RB1, RECQL4, RET, RNF43, RUNX1, SDHAF2, SDHA (sequence changes only), SDHB, SDHC, SDHD, SMAD4, SMARCA4, SMARCB1, SMARCE1, STK11, SUFU, TERC, TERT, TMEM127, TP53, TSC1, TSC2, VHL, WRN and WT1.   07/28/2019 -  Chemotherapy   The patient had palonosetron (ALOXI) injection 0.25 mg, 0.25 mg, Intravenous,  Once, 6 of 7  cycles Administration: 0.25 mg (07/28/2019), 0.25 mg (08/18/2019), 0.25 mg (09/15/2019), 0.25 mg (10/06/2019), 0.25 mg (10/27/2019), 0.25 mg (11/17/2019) pegfilgrastim (NEULASTA ONPRO KIT) injection 6 mg, 6 mg, Subcutaneous, Once, 6 of 7 cycles Administration: 6 mg (07/28/2019), 6 mg (08/18/2019), 6 mg (09/15/2019), 6 mg (10/06/2019), 6 mg (10/27/2019), 6 mg (11/17/2019) CARBOplatin (PARAPLATIN) 470 mg in sodium chloride 0.9 % 250 mL chemo infusion, 470 mg (100 % of original dose 470 mg), Intravenous,  Once, 6 of 7 cycles Dose modification:   (original dose 470 mg, Cycle 1, Reason: Provider Judgment),   (original dose 568.2 mg, Cycle 2),   (original dose 460.5 mg, Cycle 3), 460 mg (original dose 460 mg, Cycle 4, Reason: Provider Judgment),   (original dose 473.5 mg, Cycle 5, Reason: Provider Judgment), 460 mg (original dose 460 mg, Cycle 6) Administration: 470 mg (07/28/2019), 570 mg (08/18/2019), 460 mg (09/15/2019), 460 mg (10/06/2019), 460 mg (10/27/2019), 460 mg (11/17/2019) fosaprepitant (EMEND) 150 mg in sodium chloride 0.9 % 145 mL IVPB, 150 mg, Intravenous,  Once, 6 of 7 cycles Administration: 150 mg (07/28/2019), 150 mg (08/18/2019), 150 mg (09/15/2019), 150 mg (10/06/2019), 150 mg (10/27/2019), 150 mg (11/17/2019) PACLitaxel (TAXOL) 282 mg in sodium chloride 0.9 % 250 mL chemo infusion (> 53m/m2), 140 mg/m2 = 282 mg (80 % of original dose 175 mg/m2), Intravenous,  Once, 6 of 7 cycles Dose modification: 140 mg/m2 (80 % of original dose 175 mg/m2, Cycle 1, Reason: Provider Judgment), 150 mg/m2 (original dose 175 mg/m2, Cycle 2, Reason: Provider Judgment), 140 mg/m2 (original dose 175 mg/m2, Cycle 3, Reason: Provider Judgment), 140 mg/m2 (original dose 175 mg/m2, Cycle 4, Reason: Other (see comments), Comment: fatigue) Administration: 282 mg (07/28/2019), 300 mg (08/18/2019), 282 mg (09/15/2019), 282 mg (10/06/2019), 282 mg (10/27/2019), 282  mg (11/17/2019)  for chemotherapy treatment.      CANCER STAGING: Cancer Staging No  matching staging information was found for the patient.  INTERVAL HISTORY:  [name], a 75 y.o. female, returns for routine follow-up of her ovarian cancer. Laura [name] was last seen on 04/09/2020.   Today she reports feeling well. She reports occasional abdominal pain, but she has not had any pain in the past month. She has chronic IBS which occasionally causes constipation, but she denies blood in stools. She reports chronic difficulty urinating. She reports improved energy levels.  REVIEW OF SYSTEMS:  Review of Systems  Constitutional:  Positive for fatigue (50%). Negative for appetite change.  Gastrointestinal:  Positive for constipation and diarrhea. Negative for abdominal pain and blood in stool.  Genitourinary:  Positive for difficulty urinating, frequency and pelvic pain.   Musculoskeletal:  Positive for arthralgias (bilateral shoulder 4/10) and neck pain (4/10).  All other systems reviewed and are negative.  PAST MEDICAL/SURGICAL HISTORY:  Past Medical History:  Diagnosis Date   Anemia    Anxiety    Arthritis    back of neck, bones spurs on neck   Cancer (HCC)    ovarian cancer   Cataract    Cervical disc disease    Diabetes mellitus    Family history of thyroid cancer    Heart murmur    Hyperlipidemia    Hypertension    Mucoid cyst of joint    right thumb   Neuropathy    Port-A-Cath in place 07/21/2019   Reflux    Sleep apnea    wears CPAP nightly   Vertigo    Past Surgical History:  Procedure Laterality Date   ABDOMINAL HYSTERECTOMY     vaginal   BLADDER SURGERY     BREAST EXCISIONAL BIOPSY Bilateral    BREAST SURGERY     CHOLECYSTECTOMY     fibroids removed     breast (both breasts)   IR IMAGING GUIDED PORT INSERTION  07/26/2019   right   MASS EXCISION Right 06/26/2016   Procedure: EXCISION MUCOID TUMOR RIGHT THUMB, IP RIGHT THUMB;  Surgeon: Daryll Brod, MD;  Location: [facility];  Service: Orthopedics;  Laterality: Right;   PARTIAL  HYSTERECTOMY     ROBOTIC ASSISTED BILATERAL SALPINGO OOPHERECTOMY N/A 01/03/2020   Procedure: XI ROBOTIC ASSISTED BILATERAL SALPINGO OOPHORECTOMY, RADICAL TUMOR DEBULKING;  Surgeon: Everitt Amber, MD;  Location: [facility];  Service: Gynecology;  Laterality: N/A;   ROBOTIC PELVIC AND PARA-AORTIC LYMPH NODE DISSECTION N/A 01/03/2020   Procedure: XI ROBOTIC PARA-AORTIC LYMPHADENECTOMY;  Surgeon: Everitt Amber, MD;  Location: [facility];  Service: Gynecology;  Laterality: N/A;    SOCIAL HISTORY:  Social History   Socioeconomic History   Marital status: Divorced    Spouse name: Not on file   Number of children: 4   Years of education: 16   Highest education level: Not on file  Occupational History   Occupation: retired Geographical information systems officer  Tobacco Use   Smoking status: Never Smoker   Smokeless tobacco: Never Used  Scientific laboratory technician Use: Never used  Substance and Sexual Activity   Alcohol use: No    Alcohol/week: 0.0 standard drinks   Drug use: No   Sexual activity: Not Currently  Other Topics Concern   Not on file  Social History Narrative   Not on file   Social Determinants of Health   Financial Resource Strain: Low Risk    Difficulty of  Paying Living Expenses: Not hard at all  Food Insecurity: No Food Insecurity   Worried About [place] in the Last Year: Never true   Ran Out of Food in the Last Year: Never true  Transportation Needs: No Transportation Needs   Lack of Transportation (Medical): No   Lack of Transportation (Non-Medical): No  Physical Activity: Insufficiently Active   Days of Exercise per Week: 2 days   Minutes of Exercise per Session: 60 min  Stress: No Stress Concern Present   Feeling of Stress : Not at all  Social Connections: Moderately Integrated   Frequency of Communication with Friends and Family: More than three times a week   Frequency of Social Gatherings with Friends and Family: Once a week   Attends Religious Services: 1 to 4 times per  year   Active Member of [employer] or Organizations: No   Attends Music therapist: 1 to 4 times per year   Marital Status: Divorced  Human resources officer Violence: Not At Risk   Fear of Current or Ex-Partner: No   Emotionally Abused: No   Physically Abused: No   Sexually Abused: No    FAMILY HISTORY:  Family History  Problem Relation Age of Onset   Hypertension Other    Diabetes Father    Hypertension Father    Diabetes Sister    Hypertension Mother    Hypertension Sister    Stroke Sister    Diabetes Maternal Aunt    Thyroid cancer Daughter 62   Colon cancer Neg Hx    Esophageal cancer Neg Hx    Stomach cancer Neg Hx    Rectal cancer Neg Hx    Breast cancer Neg Hx    Endometrial cancer Neg Hx    Ovarian cancer Neg Hx     CURRENT MEDICATIONS:  Current Outpatient Medications  Medication Sig Dispense Refill   acetaminophen (TYLENOL) 500 MG tablet Take 1 tablet (500 mg total) by mouth every 6 (six) hours as needed. 30 tablet 0   acetaminophen (TYLENOL) 650 MG CR tablet Take 650 mg by mouth every 8 (eight) hours as needed for pain.     amLODipine (NORVASC) 5 MG tablet TAKE 1 TABLET (5 MG TOTAL) BY MOUTH DAILY. 90 tablet 3   Blood Glucose Monitoring Suppl (ONE TOUCH ULTRA 2) w/Device KIT Use as directed 1 each 0   Camphor-Eucalyptus-Menthol (VICKS VAPORUB EX) Apply 1 application topically.     Cholecalciferol (VITAMIN D3) 125 MCG (5000 UT) TABS Take 10,000-15,000 Units by mouth daily.     eszopiclone (LUNESTA) 2 MG TABS tablet Take 1 tablet (2 mg total) by mouth at bedtime as needed for sleep. Take immediately before bedtime 90 tablet 1   famotidine (PEPCID) 20 MG tablet Take 20 mg by mouth 2 (two) times daily as needed for heartburn or indigestion.     feeding supplement (BOOST HIGH PROTEIN) LIQD Take 237 mLs by mouth daily.     glimepiride (AMARYL) 2 MG tablet TAKE ONE TABLET BY MOUTH DAILY (Patient taking differently: Take 2 mg by mouth daily with breakfast.) 90 tablet 3    irbesartan (AVAPRO) 75 MG tablet Take 1 tablet (75 mg total) by mouth daily. 90 tablet 3   Lancets (ONETOUCH ULTRASOFT) lancets USE TO CHECK BLOOD SUGAR TWICE DAILY 100 each 3   loperamide (IMODIUM) 2 MG capsule Take 2 mg by mouth as needed for diarrhea or loose stools.      loratadine (CLARITIN) 10 MG tablet Take 10  mg by mouth daily as needed (allergies.).      LORazepam (ATIVAN) 0.5 MG tablet Take 1 tablet (0.5 mg total) by mouth 2 (two) times daily as needed for anxiety. 60 tablet 1   ONETOUCH ULTRA test strip USE TO CHECK BLOOD SUGARS TWO TIMES DAILY 100 strip 3   OVER THE COUNTER MEDICATION Apply 1 application topically daily as needed (pain). Triderma otc pain cream     solifenacin (VESICARE) 5 MG tablet TAKE 1 TABLET (5 MG TOTAL) BY MOUTH DAILY. (Patient taking differently: Take 5 mg by mouth daily as needed (bladder pain/spasms.).) 90 tablet 3   traMADol (ULTRAM) 50 MG tablet Take 1 tablet (50 mg total) by mouth 2 (two) times daily as needed for moderate pain (back pain). 30 tablet 0   vitamin B-12 (CYANOCOBALAMIN) 1000 MCG tablet Take 1,000 mcg by mouth daily.      No current facility-administered medications for this visit.    ALLERGIES:  Allergies  Allergen Reactions   Ciprofloxacin Swelling    Torn tendon   Hydrocodone Bit-Homatrop Mbr Other (See Comments)    Vertigo *pt strongly prefers to never take*   Sulfa Antibiotics Hives, Itching and Swelling    Tongue swells   Augmentin [Amoxicillin-Pot Clavulanate]     Diarrhea; can take PCN/ Amoxicillin   Codeine Itching   Crestor [Rosuvastatin Calcium] Other (See Comments)    Did something to memory     Doxycycline Other (See Comments)    Severe rectal Gas.   Gabapentin     disoriented   Keflex [Cephalexin] Diarrhea and Nausea And Vomiting   Naproxen Other (See Comments)    Stomach cramps   Statins     Muscle weakness   Prednisone Anxiety    *pt strongly prefers to never be given prednisone*     PHYSICAL EXAM:   Performance status (ECOG): 1 - Symptomatic but completely ambulatory  There were no vitals filed for this visit. Wt Readings from Last 3 Encounters:  06/18/20 192 lb (87.1 kg)  06/12/20 194 lb 12.8 oz (88.4 kg)  05/16/20 192 lb (87.1 kg)   Physical Exam Vitals reviewed.  Constitutional:      Appearance: Normal appearance. She is obese.  Cardiovascular:     Rate and Rhythm: Normal rate and regular rhythm.     Pulses: Normal pulses.     Heart sounds: Normal heart sounds.  Pulmonary:     Effort: Pulmonary effort is normal.     Breath sounds: Normal breath sounds.  Abdominal:     Palpations: Abdomen is soft. There is no hepatomegaly, splenomegaly or mass.     Tenderness: There is no abdominal tenderness.  Musculoskeletal:     Right lower leg: No edema.     Left lower leg: No edema.  Neurological:     General: No focal deficit present.     Mental Status: She is alert and oriented to person, place, and time.  Psychiatric:        Mood and Affect: Mood normal.        Behavior: Behavior normal.     LABORATORY DATA:  I have reviewed the labs as listed.  CBC Latest Ref Rng & Units 06/14/2020 04/09/2020 02/28/2020  WBC 4.0 - 10.5 K/uL 6.5 6.3 7.8  Hemoglobin 12.0 - 15.0 g/dL 11.3(L) 11.5(L) 12.4  Hematocrit 36.0 - 46.0 % 34.2(L) 34.6(L) 36.3  Platelets 150 - 400 K/uL 198 187 196.0   CMP Latest Ref Rng & Units 06/14/2020 05/30/2020 04/09/2020  Glucose  70 - 99 mg/dL 194(H) 120(H) 151(H)  BUN 8 - 23 mg/dL 19 19 20   Creatinine 0.44 - 1.00 mg/dL 1.06(H) 0.98 0.99  Sodium 135 - 145 mmol/L 138 140 134(L)  Potassium 3.5 - 5.1 mmol/L 3.9 4.5 3.6  Chloride 98 - 111 mmol/L 106 106 104  CO2 22 - 32 mmol/L 24 23 23   Calcium 8.9 - 10.3 mg/dL 9.5 9.9 9.6  Total Protein 6.5 - 8.1 g/dL 7.0 - 7.3  Total Bilirubin 0.3 - 1.2 mg/dL 0.6 - 0.2(L)  Alkaline Phos 38 - 126 U/L 61 - 58  AST 15 - 41 U/L 16 - 16  ALT 0 - 44 U/L 15 - 13    DIAGNOSTIC IMAGING:  I have independently reviewed the scans and  discussed with the patient. CT ABDOMEN PELVIS WO CONTRAST  Result Date: 05/31/2020 CLINICAL DATA:  75 year old female with history of ovarian cancer status post surgical resection and chemotherapy, now presenting with abdominal pain for the past several weeks. EXAM: CT ABDOMEN AND PELVIS WITHOUT CONTRAST TECHNIQUE: Multidetector CT imaging of the abdomen and pelvis was performed following the standard protocol without IV contrast. COMPARISON:  CT the abdomen and pelvis 05/01/2020. FINDINGS: Lower chest: Atherosclerotic calcifications in the descending thoracic aorta as well as the left anterior descending, left circumflex and right coronary arteries. Small hiatal hernia. Hepatobiliary: No suspicious cystic or solid hepatic lesions are confidently identified on today's noncontrast CT examination. Status post cholecystectomy. Pancreas: No definite pancreatic mass or peripancreatic fluid collections or inflammatory changes are noted on today's noncontrast CT examination. Spleen: Unremarkable. Adrenals/Urinary Tract: Unenhanced appearance of the kidneys and adrenal glands is normal bilaterally. No hydroureteronephrosis. Base of the urinary bladder extends well below the level of the pubococcygeal line at rest indicative of a cystocele. Stomach/Bowel: Unenhanced appearance of the stomach is normal. No pathologic dilatation of small bowel or colon. Normal appendix. Focal area of luminal narrowing and mural thickening in the sigmoid colon (axial image 61 of series 2 and coronal image 48 of series 4), suspicious for potential colonic neoplasm (although this may simply be reflective of under distension of this region of the colon). Vascular/Lymphatic: Aortic atherosclerosis. Multiple enlarged retroperitoneal lymph nodes are again noted. The largest of these is in the left para-aortic nodal station near the renal hilum (axial image 29 of series 2) measuring 2.6 x 2.2 cm (previously 2.6 x 2.5 cm), and adjacent to the aortic  bifurcation (axial image 41 of series 2) measuring 3.4 x 2.5 cm which appears more bulky than the prior study, although direct comparison is not possible on today's noncontrast CT examination. Other smaller retroperitoneal lymph nodes appear grossly similar to the prior study. No definite pelvic lymphadenopathy. Reproductive: Status post total abdominal hysterectomy and bilateral salpingo-oophorectomy. Other: No significant volume of ascites.  No pneumoperitoneum. Musculoskeletal: There are no aggressive appearing lytic or blastic lesions noted in the visualized portions of the skeleton. IMPRESSION: 1. Previously noted retroperitoneal lymphadenopathy is overall very similar to the prior examination, with slight regression of a lymph node adjacent to the left renal hilum, but probable slight enlargement of a lymph node adjacent to the aortic bifurcation. Other smaller lymph nodes are essentially unchanged. Continued attention on follow-up imaging is recommended. 2. No extra nodal metastatic disease noted elsewhere in the abdomen or pelvis on today's noncontrast CT examination. 3. However, there is an area of potential mural thickening and luminal narrowing in the sigmoid colon concerning for potential sigmoid neoplasm. Further evaluation with nonemergent colonoscopy is suggested  in the near future if clinically appropriate. 4. Cystocele. 5. Small hiatal hernia. 6. Aortic atherosclerosis, in addition to at least 3 vessel coronary artery disease. Assessment for potential risk factor modification, dietary therapy or pharmacologic therapy may be warranted, if clinically indicated. 7. Additional incidental findings, as above. Electronically Signed   By: Vinnie Langton M.D.   On: 05/31/2020 15:27     ASSESSMENT:  1.  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  Mass measures 6 x 5.8 cm and  is partially calcified.  Separate solid component superior to this in the left periaortic region measures 3.5 cm, SUV 14.3.  Cystic component medial to the lower pole of the left kidney is without hypermetabolic activity, possibly a lymphocele. -CT-guided biopsy of the right retroperitoneal lymph node consistent with adenocarcinoma with psammoma bodies.  Morphology and immunotherapy consistent with ovarian serous carcinoma/primary peritoneal carcinoma. -Germline mutation testing was negative. -Foundation 1 testing shows MS-stable.  Loss of heterozygosity was less than 16%. -CA-125 346 on 06/23/2019. - 6 cycles of carboplatin and paclitaxel from 07/28/2019 through 11/17/2019 -CTAP on 10/04/2019 after 3 cycles of chemotherapy showed retroperitoneal adenopathy at the bifurcation measuring 5.2 x 2.9 cm, left para-aortic lymphadenopathy measuring 5 x 4.4 cm, both of them decreased in size when compared to most recent PET scan.  CT scan report says that one of the lesion has gotten bigger but this was compared to CT scan from 04/02/2019. -PET scan on December 05, 2019 shows 4.8 x 4.2 cm fluid filled density in the left para-aortic region, non-FDG avid favoring benign retroperitoneal cyst versus lymphangioma.  Right/anterior para-aortic mixed cystic/solid lesions measuring 2.4 x 4.8 cm, SUV 2.5, previous SUV 19.5. -Robotic assisted laparoscopic total hysterectomy and bilateral salpingo-oophorectomy by Dr. Denman George on 01/03/2020. -Pathology showed soft tissue deformities 2 sites and psammomatous calcifications and chronic inflammation with no malignancy identified in the para-aortic lymph node.  Right salpingo-oophorectomy showed microscopic focus of residual adenocarcinoma less than 1 mm.  No malignancy in the left ovary.  [id number]. -As she had prior difficulty with chemotherapy, no further chemotherapy after surgery was recommended.   PLAN:  1.  Ovarian serous carcinoma: - She does not report any abdominal  pains. - She reports improving energy levels. - Reviewed latest CA125 levels which have increased to 58.9 (05/30/2020) from 39.5 (04/25/2020). - Reviewed CTAP without contrast from 05/30/2020 which showed retroperitoneal adenopathy, similar with mild improvement in lymph node adjacent to the left renal hilum.  No new evidence of metastatic disease. - She has evidence of biochemical recurrence with serially rising CA125 levels.  We talked about delaying treatment and to clinical relapse. - Recommend follow-up with CA125 and CT AP with IV contrast in 3 months from the last.   2.  Low back pain: -She is not requiring any pain medication at this time.   3.  Constipation: - She has symptoms of IBS with constipation alternating with diarrhea for a long time. - In view of the CT findings suggesting wall thickening and luminal narrowing in the sigmoid colon, she is being evaluated by GI end of this month and colonoscopy subsequently.   4.  Hypertension: - Continue amlodipine and irbesartan.   5.  Difficulty sleeping: - Continue lorazepam as needed.   6.  Overactive bladder: - Symptoms are stable at this time.   Orders placed this encounter:  No orders of the defined types were placed  in this encounter.    Derek Jack, MD [facility] [phone]   I, Thana Ates, am acting as a scribe for Dr. Derek Jack.  I, Derek Jack MD, have reviewed the above documentation for accuracy and completeness, and I agree with the above.

## 2020-06-21 ENCOUNTER — Encounter (HOSPITAL_COMMUNITY): Payer: Self-pay | Admitting: Hematology

## 2020-06-21 ENCOUNTER — Other Ambulatory Visit: Payer: Self-pay

## 2020-06-21 ENCOUNTER — Encounter: Payer: Medicare Other | Admitting: Physical Therapy

## 2020-06-21 ENCOUNTER — Inpatient Hospital Stay (HOSPITAL_BASED_OUTPATIENT_CLINIC_OR_DEPARTMENT_OTHER): Payer: Medicare Other | Admitting: Hematology

## 2020-06-21 VITALS — BP 154/69 | HR 85 | Temp 98.9°F | Resp 18 | Wt 190.0 lb

## 2020-06-21 DIAGNOSIS — I10 Essential (primary) hypertension: Secondary | ICD-10-CM | POA: Diagnosis not present

## 2020-06-21 DIAGNOSIS — N3281 Overactive bladder: Secondary | ICD-10-CM | POA: Diagnosis not present

## 2020-06-21 DIAGNOSIS — M545 Low back pain, unspecified: Secondary | ICD-10-CM | POA: Diagnosis not present

## 2020-06-21 DIAGNOSIS — C563 Malignant neoplasm of bilateral ovaries: Secondary | ICD-10-CM | POA: Diagnosis not present

## 2020-06-21 DIAGNOSIS — C569 Malignant neoplasm of unspecified ovary: Secondary | ICD-10-CM

## 2020-06-21 DIAGNOSIS — K5900 Constipation, unspecified: Secondary | ICD-10-CM | POA: Diagnosis not present

## 2020-06-21 DIAGNOSIS — Z79899 Other long term (current) drug therapy: Secondary | ICD-10-CM | POA: Diagnosis not present

## 2020-06-21 DIAGNOSIS — E119 Type 2 diabetes mellitus without complications: Secondary | ICD-10-CM | POA: Diagnosis not present

## 2020-06-21 DIAGNOSIS — Z9221 Personal history of antineoplastic chemotherapy: Secondary | ICD-10-CM | POA: Diagnosis not present

## 2020-06-21 NOTE — Patient Instructions (Addendum)
[place] at [facility] Discharge Instructions  You were seen today by Dr. Delton Coombes. He went over your recent results. You will be scheduled for a CT scan of your abdomen and pelvis prior to your next visit. Dr. Delton Coombes will see you back in 10 weeks for labs and follow up.   Thank you for choosing [facility] at [facility] to provide your oncology and hematology care.  To afford each patient quality time with our provider, please arrive at least 15 minutes before your scheduled appointment time.   If you have a lab appointment with the [facility] please come in thru the Main Entrance and check in at the main information desk  You need to re-schedule your appointment should you arrive 10 or more minutes late.  We strive to give you quality time with our providers, and arriving late affects you and other patients whose appointments are after yours.  Also, if you no show three or more times for appointments you may be dismissed from the clinic at the providers discretion.     Again, thank you for choosing [facility].  Our hope is that these requests will decrease the amount of time that you wait before being seen by our physicians.       _____________________________________________________________  Should you have questions after your visit to [facility], please contact our office at [phone] between the hours of 8:00 a.m. and 4:30 p.m.  Voicemails left after 4:00 p.m. will not be returned until the following business day.  For prescription refill requests, have your pharmacy contact our office and allow 72 hours.    Cancer Center Support Programs:   > Cancer Support Group  2nd Tuesday of the month 1pm-2pm, Journey Room

## 2020-06-25 ENCOUNTER — Other Ambulatory Visit: Payer: Self-pay

## 2020-06-25 ENCOUNTER — Ambulatory Visit: Payer: Medicare Other | Admitting: *Deleted

## 2020-06-25 DIAGNOSIS — G8929 Other chronic pain: Secondary | ICD-10-CM | POA: Diagnosis not present

## 2020-06-25 DIAGNOSIS — M25612 Stiffness of left shoulder, not elsewhere classified: Secondary | ICD-10-CM | POA: Diagnosis not present

## 2020-06-25 DIAGNOSIS — M25512 Pain in left shoulder: Secondary | ICD-10-CM | POA: Diagnosis not present

## 2020-06-25 DIAGNOSIS — M542 Cervicalgia: Secondary | ICD-10-CM

## 2020-06-25 NOTE — Therapy (Signed)
[facility] [place], [place], [address] Phone: [phone]   Fax:  [phone]  Physical Therapy Treatment  Patient Details  Name: Laura [name] MRN: [id number] Date of Birth: 06-05-1945 Referring Provider (PT): Basil Dess MD   Encounter Date: 06/25/2020   PT End of Session - 06/25/20 1351     Visit Number 8    Number of Visits 12    Date for PT Re-Evaluation 08/23/20    Authorization Type FOTO AT LEAST EVERY 5TH VISIT.  PROGRESS NOTE AT 10TH VISIT.  KX MODIFIER AFTER 15 VISITS.    PT Start Time 1300    PT Stop Time 1350    PT Time Calculation (min) 50 min             Past Medical History:  Diagnosis Date   Anemia    Anxiety    Arthritis    back of neck, bones spurs on neck   Cancer (HCC)    ovarian cancer   Cataract    Cervical disc disease    Diabetes mellitus    Family history of thyroid cancer    Heart murmur    Hyperlipidemia    Hypertension    Mucoid cyst of joint    right thumb   Neuropathy    Port-A-Cath in place 07/21/2019   Reflux    Sleep apnea    wears CPAP nightly   Vertigo     Past Surgical History:  Procedure Laterality Date   ABDOMINAL HYSTERECTOMY     vaginal   BLADDER SURGERY     BREAST EXCISIONAL BIOPSY Bilateral    BREAST SURGERY     CHOLECYSTECTOMY     fibroids removed     breast (both breasts)   IR IMAGING GUIDED PORT INSERTION  07/26/2019   right   MASS EXCISION Right 06/26/2016   Procedure: EXCISION MUCOID TUMOR RIGHT THUMB, IP RIGHT THUMB;  Surgeon: Daryll Brod, MD;  Location: [place];  Service: Orthopedics;  Laterality: Right;   PARTIAL HYSTERECTOMY     ROBOTIC ASSISTED BILATERAL SALPINGO OOPHERECTOMY N/A 01/03/2020   Procedure: XI ROBOTIC ASSISTED BILATERAL SALPINGO OOPHORECTOMY, RADICAL TUMOR DEBULKING;  Surgeon: Everitt Amber, MD;  Location: [facility];  Service: Gynecology;  Laterality: N/A;   ROBOTIC PELVIC AND PARA-AORTIC LYMPH NODE DISSECTION N/A  01/03/2020   Procedure: XI ROBOTIC PARA-AORTIC LYMPHADENECTOMY;  Surgeon: Everitt Amber, MD;  Location: [facility];  Service: Gynecology;  Laterality: N/A;    There were no vitals filed for this visit.   Subjective Assessment - 06/25/20 1306     Subjective COVID-19 screen performed prior to patient entering clinic. Saw Dr. Louanne Skye yesterday and he is very pleased with her progress. She reports bil shoulder pain today. She had pain on Friday but lying down not when upright and in bil shoulders.    Pertinent History Ovarian cancer ("cancer-free".), DM, neuropathy, cervical disc disease, OA, HTN.    How long can you sit comfortably? Varies.    Patient Stated Goals Reduce pain and get more motion in left shoulder.  Become more active.    Currently in Pain? Yes    Pain Score 6     Pain Location Shoulder    Pain Orientation Left    Pain Type Chronic pain    Pain Onset More than a month ago    Multiple Pain Sites Yes    Pain Score 6    Pain Location Neck    Pain Orientation Left  Pain Descriptors / Indicators Sharp    Pain Type Chronic pain                               [facility] Adult PT Treatment/Exercise - 06/25/20 0001       Exercises   Exercises Shoulder      Shoulder Exercises: Pulleys   Flexion 5 minutes      Shoulder Exercises: ROM/Strengthening   UBE (Upper Arm Bike) x 5 mins 120RPMs      Modalities   Modalities Traction      Traction   Type of Traction Cervical    Min (lbs) 5    Max (lbs) 10    Hold Time 90    Rest Time 5    Time 15      Manual Therapy   Manual Therapy Soft tissue mobilization    Manual therapy comments decreased pain after STW 3/10    Soft tissue mobilization STW and TPR  to LT and RT UT and levator scap with good release both sides                         PT Long Term Goals - 05/25/20 1406       PT LONG TERM GOAL #1   Title Perform ADL's with pain not > 3/10.    Time 6    Status New      PT LONG TERM GOAL  #2   Title Active left shoulder flexion to 125 degrees so the patient can easily reach overhead    Time 6    Period Weeks    Status New      PT LONG TERM GOAL #3   Title Left shoulder ER to 55 degrees.    Time 6    Period Weeks    Status New      PT LONG TERM GOAL #4   Title Bilateral active cervical rotation to 60 degrees+.    Time 6    Period Weeks    Status New                   Plan - 06/25/20 1406     Clinical Impression Statement Pt arrived today with c/o pain in both UT's shldrs. She did well with pulleys and UBE f/b STW and traction at 10#s and felt much better end of session 3/10. Good TPR with STW today    Personal Factors and Comorbidities Comorbidity 1;Other    Comorbidities Ovarian cancer ("cancer-free".), DM, neuropathy, cervical disc disease, OA, HTN.    Examination-Activity Limitations Other;Reach Overhead    Examination-Participation Restrictions Other    Stability/Clinical Decision Making Evolving/Moderate complexity    PT Treatment/Interventions ADLs/Self Care Home Management;Cryotherapy;Traction;Moist Heat;Functional mobility training;Therapeutic activities;Therapeutic exercise;Manual techniques;Patient/family education;Passive range of motion    PT Next Visit Plan consider DN to left scapular region (would need recert), else continue releasing infraspinatus and subscapularis to help improve ROM and decrease pain and tension.             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) 01/03/2020   Nontraumatic tear of right tibialis posterior tendon 12/14/2019   Right ankle pain 12/07/2019   Right ankle tendonitis 11/11/2019   Aortic atherosclerosis ([place]) 10/12/2019   Dehydration 10/06/2019   Port-A-Cath in place 07/21/2019   Genetic testing 07/15/2019   Elevated CA-125 07/07/2019   Family history of thyroid cancer    Malignant neoplasm of both ovaries 06/20/2019   Paronychia, finger, left 04/15/2019   Vaginitis 04/15/2019   Abdominal pain 04/04/2019   Retroperitoneal mass 04/03/2019   Acute gouty arthritis 12/17/2018   Bilateral hearing loss 12/21/2017   Eustachian tube disorder, bilateral 12/21/2017   Cough 02/23/2017   Left hand pain 05/26/2016   Mucoid cyst, joint 05/26/2016   Pain in finger of right hand 05/26/2016   Primary osteoarthritis of both first carpometacarpal joints 05/26/2016   Skin avulsion 05/26/2016   Adhesive capsulitis of left shoulder 03/20/2016   Fatigue 12/16/2015   Acute bronchitis 10/31/2015   Urinary frequency 03/09/2015   Hearing loss, sensorineural, asymmetrical 11/09/2014   Vestibular hypofunction, left 11/09/2014   Obesity 07/25/2014   Orthostatic hypotension 07/07/2014   Upper airway cough syndrome 06/26/2014   Diarrhea 05/02/2014   Dizziness 04/12/2014   Neuropathy    Rash and nonspecific skin eruption 08/30/2013   Paresthesia of both feet 08/30/2013   Blurred vision 02/15/2013   Eye pain 02/15/2013   Sinusitis, chronic 10/07/2012   Left shoulder pain 04/15/2012   Encounter for well adult exam with abnormal findings 10/22/2011   Cervical disc disease 10/22/2011   Bilateral hand pain 08/05/2011   SNHL (sensorineural hearing loss) 08/04/2011   Fibrocystic breast disease 07/02/2011   Hypertension 07/01/2011   GERD (gastroesophageal reflux disease) 06/29/2011   Bloating 06/29/2011   Back pain 03/13/2011   Vertigo 01/04/2011    Nystagmus 01/04/2011   Insomnia 10/21/2010   Other constipation 08/17/2009   Vitamin D deficiency 05/15/2009   DYSPNEA 03/13/2009   Depression with anxiety 01/16/2009   Irritable bowel syndrome 01/16/2009   Primary osteoarthritis of both hands 12/15/2008   Diabetes mellitus type 2, noninsulin dependent ([place]) 11/03/2008   Hyperlipidemia 11/03/2008   Anxiety state 11/03/2008   OVERACTIVE BLADDER 11/03/2008   OSA (obstructive sleep apnea) 11/03/2008   MURMUR 11/03/2008    Rohn Fritsch,CHRIS 06/25/2020, 2:11 PM  [facility] [address] [place], [place], [address] Phone: [phone]   Fax:  [phone]  Name: Laura [name] MRN: [id number] Date of Birth: 1945/02/16

## 2020-06-28 ENCOUNTER — Other Ambulatory Visit: Payer: Self-pay

## 2020-06-28 ENCOUNTER — Ambulatory Visit (INDEPENDENT_AMBULATORY_CARE_PROVIDER_SITE_OTHER): Payer: Medicare Other | Admitting: Gastroenterology

## 2020-06-28 ENCOUNTER — Encounter: Payer: Self-pay | Admitting: Gastroenterology

## 2020-06-28 ENCOUNTER — Ambulatory Visit: Payer: Medicare Other | Admitting: *Deleted

## 2020-06-28 VITALS — BP 160/70 | HR 81 | Ht 64.0 in | Wt 193.6 lb

## 2020-06-28 DIAGNOSIS — R933 Abnormal findings on diagnostic imaging of other parts of digestive tract: Secondary | ICD-10-CM | POA: Diagnosis not present

## 2020-06-28 DIAGNOSIS — G8929 Other chronic pain: Secondary | ICD-10-CM | POA: Diagnosis not present

## 2020-06-28 DIAGNOSIS — M25612 Stiffness of left shoulder, not elsewhere classified: Secondary | ICD-10-CM | POA: Diagnosis not present

## 2020-06-28 DIAGNOSIS — M542 Cervicalgia: Secondary | ICD-10-CM

## 2020-06-28 DIAGNOSIS — M25512 Pain in left shoulder: Secondary | ICD-10-CM | POA: Diagnosis not present

## 2020-06-28 NOTE — Progress Notes (Signed)
____________________________________________________________  Attending physician addendum:  Thank you for sending this case to me. I have reviewed the entire note and agree with the plan.  Glad I had a slot available soon. We will see next week, but the findings on sigmoid may just be under distention.  Wilfrid Lund, MD  ____________________________________________________________

## 2020-06-28 NOTE — Therapy (Signed)
[facility] [place], [place], [address] Phone: [phone]   Fax:  [phone]  Physical Therapy Treatment  Patient Details  Name: Laura [name] MRN: [id number] Date of Birth: April 21, 1945 Referring Provider (PT): Basil Dess MD   Encounter Date: 06/28/2020   PT End of Session - 06/28/20 1310     Visit Number 9    Number of Visits 12    Date for PT Re-Evaluation 08/23/20    Authorization Type FOTO AT LEAST EVERY 5TH VISIT.  PROGRESS NOTE AT 10TH VISIT.  KX MODIFIER AFTER 15 VISITS.    PT Start Time 1300    PT Stop Time 1346    PT Time Calculation (min) 46 min             Past Medical History:  Diagnosis Date   Anemia    Anxiety    Arthritis    back of neck, bones spurs on neck   Cancer (HCC)    ovarian cancer   Cataract    Cervical disc disease    Diabetes mellitus    Family history of thyroid cancer    Heart murmur    Hyperlipidemia    Hypertension    Mucoid cyst of joint    right thumb   Neuropathy    Port-A-Cath in place 07/21/2019   Reflux    Sleep apnea    wears CPAP nightly   Vertigo     Past Surgical History:  Procedure Laterality Date   ABDOMINAL HYSTERECTOMY     vaginal   BLADDER SURGERY     BREAST EXCISIONAL BIOPSY Bilateral    BREAST SURGERY     CHOLECYSTECTOMY     fibroids removed     breast (both breasts)   IR IMAGING GUIDED PORT INSERTION  07/26/2019   right   MASS EXCISION Right 06/26/2016   Procedure: EXCISION MUCOID TUMOR RIGHT THUMB, IP RIGHT THUMB;  Surgeon: Daryll Brod, MD;  Location: [place];  Service: Orthopedics;  Laterality: Right;   PARTIAL HYSTERECTOMY     ROBOTIC ASSISTED BILATERAL SALPINGO OOPHERECTOMY N/A 01/03/2020   Procedure: XI ROBOTIC ASSISTED BILATERAL SALPINGO OOPHORECTOMY, RADICAL TUMOR DEBULKING;  Surgeon: Everitt Amber, MD;  Location: [facility];  Service: Gynecology;  Laterality: N/A;   ROBOTIC PELVIC AND PARA-AORTIC LYMPH NODE DISSECTION N/A  01/03/2020   Procedure: XI ROBOTIC PARA-AORTIC LYMPHADENECTOMY;  Surgeon: Everitt Amber, MD;  Location: [facility];  Service: Gynecology;  Laterality: N/A;    There were no vitals filed for this visit.   Subjective Assessment - 06/28/20 1306     Subjective COVID-19 screen performed prior to patient entering clinic. Much better after last Rx    Pertinent History Ovarian cancer ("cancer-free".), DM, neuropathy, cervical disc disease, OA, HTN.    How long can you sit comfortably? Varies.    Patient Stated Goals Reduce pain and get more motion in left shoulder.  Become more active.    Currently in Pain? Yes    Pain Score 5     Pain Location Shoulder    Pain Orientation Left    Pain Descriptors / Indicators Sore    Pain Type Chronic pain    Pain Onset More than a month ago                               [facility] Adult PT Treatment/Exercise - 06/28/20 0001       Exercises  Exercises Shoulder      Shoulder Exercises: Pulleys   Flexion 5 minutes      Shoulder Exercises: ROM/Strengthening   UBE (Upper Arm Bike) x 5 mins 120RPMs      Modalities   Modalities Traction      Traction   Type of Traction Cervical    Min (lbs) 5    Max (lbs) 10    Hold Time 90    Rest Time 5    Time 15      Manual Therapy   Manual Therapy Soft tissue mobilization    Soft tissue mobilization STW and TPR  to LT and RT UT and levator scap with good release both sides    Passive ROM PROM / AAROM flexion and IR / ER with end-range holds.                         PT Long Term Goals - 05/25/20 1406       PT LONG TERM GOAL #1   Title Perform ADL's with pain not > 3/10.    Time 6    Status New      PT LONG TERM GOAL #2   Title Active left shoulder flexion to 125 degrees so the patient can easily reach overhead    Time 6    Period Weeks    Status New      PT LONG TERM GOAL #3   Title Left shoulder ER to 55 degrees.    Time 6    Period Weeks    Status New      PT  LONG TERM GOAL #4   Title Bilateral active cervical rotation to 60 degrees+.    Time 6    Period Weeks    Status New                   Plan - 06/28/20 1411     Clinical Impression Statement Pt arrived today doing fairly well and reports doing good after last Rx. UBE and pulleys performed as well as  PROM and STW. Traction perormed  at 10#s again. PROM LT shldr flexion to 110 degrees and ER to 30 degrees.    Personal Factors and Comorbidities Comorbidity 1;Other    Comorbidities Ovarian cancer ("cancer-free".), DM, neuropathy, cervical disc disease, OA, HTN.    Examination-Activity Limitations Other;Reach Overhead    Examination-Participation Restrictions Other    Stability/Clinical Decision Making Evolving/Moderate complexity    Rehab Potential Good    PT Frequency 2x / week    PT Duration 6 weeks    PT Treatment/Interventions ADLs/Self Care Home Management;Cryotherapy;Traction;Moist Heat;Functional mobility training;Therapeutic activities;Therapeutic exercise;Manual techniques;Patient/family education;Passive range of motion    PT Next Visit Plan consider DN to left scapular region (would need recert), else continue releasing infraspinatus and subscapularis to help improve ROM and decrease pain and tension.             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[place]) 01/03/2020   Nontraumatic tear of right tibialis  posterior tendon 12/14/2019   Right ankle pain 12/07/2019   Right ankle  tendonitis 11/11/2019   Aortic atherosclerosis ([place]) 10/12/2019   Dehydration 10/06/2019   Port-A-Cath in place 07/21/2019   Genetic testing 07/15/2019   Elevated CA-125 07/07/2019   Family history of thyroid cancer    Malignant neoplasm of both ovaries 06/20/2019   Paronychia, finger, left 04/15/2019   Vaginitis 04/15/2019   Abdominal pain 04/04/2019   Retroperitoneal mass 04/03/2019   Acute gouty arthritis 12/17/2018   Bilateral hearing loss 12/21/2017   Eustachian tube disorder, bilateral 12/21/2017   Cough 02/23/2017   Left hand pain 05/26/2016   Mucoid cyst, joint 05/26/2016   Pain in finger of right hand 05/26/2016   Primary osteoarthritis of both first carpometacarpal joints 05/26/2016   Skin avulsion 05/26/2016   Adhesive capsulitis of left shoulder 03/20/2016   Fatigue 12/16/2015   Acute bronchitis 10/31/2015   Urinary frequency 03/09/2015   Hearing loss, sensorineural, asymmetrical 11/09/2014   Vestibular hypofunction, left 11/09/2014   Obesity 07/25/2014   Orthostatic hypotension 07/07/2014   Upper airway cough syndrome 06/26/2014   Diarrhea 05/02/2014   Dizziness 04/12/2014   Neuropathy    Rash and nonspecific skin eruption 08/30/2013   Paresthesia of both feet 08/30/2013   Blurred vision 02/15/2013   Eye pain 02/15/2013   Sinusitis, chronic 10/07/2012   Left shoulder pain 04/15/2012   Encounter for well adult exam with abnormal findings 10/22/2011   Cervical disc disease 10/22/2011   Bilateral hand pain 08/05/2011   SNHL (sensorineural hearing loss) 08/04/2011   Fibrocystic breast disease 07/02/2011   Hypertension 07/01/2011   GERD (gastroesophageal reflux disease) 06/29/2011   Bloating 06/29/2011   Back pain 03/13/2011   Vertigo 01/04/2011   Nystagmus 01/04/2011   Insomnia 10/21/2010   Other constipation 08/17/2009   Vitamin D deficiency 05/15/2009   DYSPNEA 03/13/2009   Depression with anxiety 01/16/2009   Irritable bowel syndrome 01/16/2009    Primary osteoarthritis of both hands 12/15/2008   Diabetes mellitus type 2, noninsulin dependent ([place]) 11/03/2008   Hyperlipidemia 11/03/2008   Anxiety state 11/03/2008   OVERACTIVE BLADDER 11/03/2008   OSA (obstructive sleep apnea) 11/03/2008   MURMUR 11/03/2008    Mathan Darroch,CHRIS, PTA 06/28/2020, 2:19 PM  [facility] [address] [place], [place], [address] Phone: [phone]   Fax:  [phone]  Name: AVEYAH GREENWOOD MRN: [id number] Date of Birth: Sep 10, 1945

## 2020-06-28 NOTE — Progress Notes (Signed)
06/28/2020 Laura [name] [id number] 1945/12/13   HISTORY OF PRESENT ILLNESS: This is a 75 year old female who is a patient of Dr. Ardis Hughs.  She has been referred here on this occasion by Joylene John, NP, in order to discuss colonoscopy for abnormal CT scan.  She had ovarian cancer for which she had resection and chemotherapy.  It was noted that her CA 125 has continued to increase.  CT scan of the abdomen and pelvis without contrast was performed on May 19.  That showed an area of potential mural thickening and luminal narrowing in the sigmoid colon concerning for potential sigmoid neoplasm.  She reports irregular bowel habits.  She says that she will tend to be constipated and then if she takes something to help her go then it seems like she is back and forth to the bathroom very often and cannot seem to get emptied all at once.  Does not necessarily get diarrhea per se, however.  But she will use Imodium to try to slow it down and then she gets into a cycle of using things to help her go and then using things to slow her down.  She denies any rectal bleeding.  She reports to me that she is not having any abdominal pain.  Last colonoscopy was in September 2018 at which time the entire exam was normal.  No repeat was recommended due to age.   Past Medical History:  Diagnosis Date   Anemia    Anxiety    Arthritis    back of neck, bones spurs on neck   Cancer (HCC)    ovarian cancer   Cataract    Cervical disc disease    Diabetes mellitus    Family history of thyroid cancer    Heart murmur    Hyperlipidemia    Hypertension    Mucoid cyst of joint    right thumb   Neuropathy    Port-A-Cath in place 07/21/2019   Reflux    Sleep apnea    wears CPAP nightly   Vertigo    Past Surgical History:  Procedure Laterality Date   ABDOMINAL HYSTERECTOMY     vaginal   BLADDER SURGERY     BREAST EXCISIONAL BIOPSY Bilateral    BREAST SURGERY     CHOLECYSTECTOMY     fibroids removed      breast (both breasts)   IR IMAGING GUIDED PORT INSERTION  07/26/2019   right   MASS EXCISION Right 06/26/2016   Procedure: EXCISION MUCOID TUMOR RIGHT THUMB, IP RIGHT THUMB;  Surgeon: Daryll Brod, MD;  Location: [facility];  Service: Orthopedics;  Laterality: Right;   PARTIAL HYSTERECTOMY     ROBOTIC ASSISTED BILATERAL SALPINGO OOPHERECTOMY N/A 01/03/2020   Procedure: XI ROBOTIC ASSISTED BILATERAL SALPINGO OOPHORECTOMY, RADICAL TUMOR DEBULKING;  Surgeon: Everitt Amber, MD;  Location: [facility];  Service: Gynecology;  Laterality: N/A;   ROBOTIC PELVIC AND PARA-AORTIC LYMPH NODE DISSECTION N/A 01/03/2020   Procedure: XI ROBOTIC PARA-AORTIC LYMPHADENECTOMY;  Surgeon: Everitt Amber, MD;  Location: [facility];  Service: Gynecology;  Laterality: N/A;    reports that she has never smoked. She has never used smokeless tobacco. She reports that she does not drink alcohol and does not use drugs. family history includes Diabetes in her father, maternal aunt, and sister; Hypertension in her father, mother, sister, and another family member; Stroke in her sister; Thyroid cancer (age of onset: 99) in her daughter. Allergies  Allergen Reactions   Ciprofloxacin  Swelling    Torn tendon   Hydrocodone Bit-Homatrop Mbr Other (See Comments)    Vertigo *pt strongly prefers to never take*   Sulfa Antibiotics Hives, Itching and Swelling    Tongue swells   Augmentin [Amoxicillin-Pot Clavulanate]     Diarrhea; can take PCN/ Amoxicillin   Codeine Itching   Crestor [Rosuvastatin Calcium] Other (See Comments)    Did something to memory     Doxycycline Other (See Comments)    Severe rectal Gas.   Gabapentin     disoriented   Keflex [Cephalexin] Diarrhea and Nausea And Vomiting   Naproxen Other (See Comments)    Stomach cramps   Statins     Muscle weakness   Prednisone Anxiety    *pt strongly prefers to never be given prednisone*       Outpatient Encounter Medications as of 06/28/2020  Medication Sig    acetaminophen (TYLENOL) 500 MG tablet Take 1 tablet (500 mg total) by mouth every 6 (six) hours as needed.   amLODipine (NORVASC) 5 MG tablet TAKE 1 TABLET (5 MG TOTAL) BY MOUTH DAILY.   Blood Glucose Monitoring Suppl (ONE TOUCH ULTRA 2) w/Device KIT Use as directed   Camphor-Eucalyptus-Menthol (VICKS VAPORUB EX) Apply 1 application topically.   Cholecalciferol (VITAMIN D3) 125 MCG (5000 UT) TABS Take 10,000-15,000 Units by mouth daily.   eszopiclone (LUNESTA) 2 MG TABS tablet Take 1 tablet (2 mg total) by mouth at bedtime as needed for sleep. Take immediately before bedtime   famotidine (PEPCID) 20 MG tablet Take 20 mg by mouth 2 (two) times daily as needed for heartburn or indigestion.   feeding supplement (BOOST HIGH PROTEIN) LIQD Take 237 mLs by mouth daily.   glimepiride (AMARYL) 2 MG tablet TAKE ONE TABLET BY MOUTH DAILY (Patient taking differently: Take 2 mg by mouth daily with breakfast.)   irbesartan (AVAPRO) 75 MG tablet Take 1 tablet (75 mg total) by mouth daily.   Lancets (ONETOUCH ULTRASOFT) lancets USE TO CHECK BLOOD SUGAR TWICE DAILY   loperamide (IMODIUM) 2 MG capsule Take 2 mg by mouth as needed for diarrhea or loose stools.    loratadine (CLARITIN) 10 MG tablet Take 10 mg by mouth daily as needed (allergies.).    LORazepam (ATIVAN) 0.5 MG tablet Take 1 tablet (0.5 mg total) by mouth 2 (two) times daily as needed for anxiety.   ONETOUCH ULTRA test strip USE TO CHECK BLOOD SUGARS TWO TIMES DAILY   OVER THE COUNTER MEDICATION Apply 1 application topically daily as needed (pain). Triderma otc pain cream   solifenacin (VESICARE) 5 MG tablet TAKE 1 TABLET (5 MG TOTAL) BY MOUTH DAILY. (Patient taking differently: Take 5 mg by mouth daily as needed (bladder pain/spasms.).)   traMADol (ULTRAM) 50 MG tablet Take 1 tablet (50 mg total) by mouth 2 (two) times daily as needed for moderate pain (back pain).   vitamin B-12 (CYANOCOBALAMIN) 1000 MCG tablet Take 1,000 mcg by mouth daily.     [DISCONTINUED] enoxaparin (LOVENOX) 40 MG/0.4ML injection Inject 0.4 mLs (40 mg total) into the skin daily for 14 days. For AFTER surgery only   No facility-administered encounter medications on file as of 06/28/2020.     REVIEW OF SYSTEMS  : All other systems reviewed and negative except where noted in the History of Present Illness.   PHYSICAL EXAM: BP (!) 160/70   Pulse 81   Ht 5' 4"  (1.626 m)   Wt 193 lb 9.6 oz (87.8 kg)   BMI 33.23 kg/m  General: Well developed AA female in no acute distress Head: Normocephalic and atraumatic Eyes:  Sclerae anicteric, conjunctiva pink. Ears: Normal auditory acuity Lungs: Clear throughout to auscultation; no W/R/R. Heart: Regular rate and rhythm; no M/R/G Abdomen: Soft, non-distended.  BS present.  Mild epigastric TTP. Rectal:  Will be done at the time of colonoscopy. Musculoskeletal: Symmetrical with no gross deformities  Skin: No lesions on visible extremities Extremities: No edema  Neurological: Alert oriented x 4, grossly non-focal Psychological:  Alert and cooperative. Normal mood and affect  ASSESSMENT AND PLAN: *Abnormal CT scan showing potential mural thickening and luminal narrowing in the sigmoid colon concerning for potential sigmoid neoplasm.  Last colonoscopy September 2018 was completely normal.  We will plan for colonoscopy with Dr. Loletha Carrow due to sooner availability.  The risks, benefits, and alternatives to colonoscopy were discussed with the patient and she consents to proceed.  *Irregular bowel habits: Has underlying constipation, but then if she takes something to help her go she has several frequent, incomplete bowel movements.  Tends to chase back and forth with laxatives and then antidiarrheals.  We discussed trying Benefiber 2 teaspoons mixed in 8 ounces of liquid daily to see if that will help her go and also then create bulk to her stools as well.  She says that she already has some of that at home as she had used it at some  point in the past. *History of ovarian cancer  CC:  Biagio Borg, MD

## 2020-06-28 NOTE — Patient Instructions (Signed)
You have been scheduled for a colonoscopy. Please follow written instructions given to you at your visit today.  Please pick up your prep supplies at the pharmacy within the next 1-3 days. If you use inhalers (even only as needed), please bring them with you on the day of your procedure.  If you are age 76 or older, your body mass index should be between 23-30. Your Body mass index is 33.23 kg/m. If this is out of the aforementioned range listed, please consider follow up with your Primary Care Provider.  If you are age 35 or younger, your body mass index should be between 19-25. Your Body mass index is 33.23 kg/m. If this is out of the aformentioned range listed, please consider follow up with your Primary Care Provider.   __________________________________________________________  The [facility] providers would like to encourage you to use [facility] to communicate with providers for non-urgent requests or questions.  Due to long hold times on the telephone, sending your provider a message by [facility] may be a faster and more efficient way to get a response.  Please allow 48 business hours for a response.  Please remember that this is for non-urgent requests.

## 2020-06-29 ENCOUNTER — Ambulatory Visit: Payer: Medicare Other | Admitting: Nurse Practitioner

## 2020-06-29 ENCOUNTER — Encounter (HOSPITAL_COMMUNITY): Payer: Self-pay | Admitting: Hematology

## 2020-07-01 ENCOUNTER — Encounter: Payer: Self-pay | Admitting: Certified Registered Nurse Anesthetist

## 2020-07-01 NOTE — Progress Notes (Signed)
I agree with the above note, plan.  Mallie Mussel, thanks for helping with her.

## 2020-07-02 ENCOUNTER — Other Ambulatory Visit: Payer: Self-pay

## 2020-07-02 ENCOUNTER — Encounter: Payer: Self-pay | Admitting: Gastroenterology

## 2020-07-02 ENCOUNTER — Ambulatory Visit: Payer: Medicare Other | Admitting: Physical Therapy

## 2020-07-02 ENCOUNTER — Ambulatory Visit (AMBULATORY_SURGERY_CENTER): Payer: Medicare Other | Admitting: Gastroenterology

## 2020-07-02 VITALS — BP 148/80 | HR 79 | Temp 97.0°F | Resp 16 | Ht 64.0 in | Wt 193.0 lb

## 2020-07-02 DIAGNOSIS — R933 Abnormal findings on diagnostic imaging of other parts of digestive tract: Secondary | ICD-10-CM

## 2020-07-02 DIAGNOSIS — K5909 Other constipation: Secondary | ICD-10-CM

## 2020-07-02 DIAGNOSIS — K5730 Diverticulosis of large intestine without perforation or abscess without bleeding: Secondary | ICD-10-CM | POA: Diagnosis not present

## 2020-07-02 HISTORY — PX: COLONOSCOPY: SHX174

## 2020-07-02 MED ORDER — SODIUM CHLORIDE 0.9 % IV SOLN: 500.0000 mL | Freq: Once | INTRAVENOUS | Status: DC

## 2020-07-02 NOTE — Progress Notes (Signed)
1116 BP  179/100, Labetalol given IV, MD update, vss

## 2020-07-02 NOTE — Op Note (Signed)
[facility] Patient Name: Laura [name] Procedure Date: 07/02/2020 11:02 AM MRN: [id number] Endoscopist: [place]. Loletha Carrow , MD Age: 75 Referring MD:  Date of Birth: October 24, 1945 Gender: Female Account #: [id number] Procedure:                Colonoscopy Indications:              Abnormal CT of the GI tract (sigmoid wall                            thickening seen on surveillance CT for ovarian CA),                            Constipation Medicines:                Monitored Anesthesia Care Procedure:                Pre-Anesthesia Assessment:                           - Prior to the procedure, a History and Physical                            was performed, and patient medications and                            allergies were reviewed. The patient's tolerance of                            previous anesthesia was also reviewed. The risks                            and benefits of the procedure and the sedation                            options and risks were discussed with the patient.                            All questions were answered, and informed consent                            was obtained. Prior Anticoagulants: The patient has                            taken no previous anticoagulant or antiplatelet                            agents. ASA Grade Assessment: II - A patient with                            mild systemic disease. After reviewing the risks                            and benefits, the patient was deemed in  satisfactory condition to undergo the procedure.                           After obtaining informed consent, the colonoscope                            was passed under direct vision. Throughout the                            procedure, the patient's blood pressure, pulse, and                            oxygen saturations were monitored continuously. The                            Olympus CF-[id number]) Colonoscope was                             introduced through the anus and advanced to the the                            cecum, identified by appendiceal orifice and                            ileocecal valve. The colonoscopy was performed                            without difficulty. The patient tolerated the                            procedure well. The quality of the bowel                            preparation was fair. The ileocecal valve,                            appendiceal orifice, and rectum were photographed. Scope In: 27:25:36 AM Scope Out: 11:27:58 AM Scope Withdrawal Time: 0 hours 7 minutes 59 seconds  Total Procedure Duration: 0 hours 11 minutes 47 seconds  Findings:                 The perianal and digital rectal examinations were                            normal.                           A few small-mouthed diverticula were found in the                            sigmoid colon.                           The exam was otherwise without abnormality on  direct and retroflexion views. Complications:            No immediate complications. Estimated Blood Loss:     Estimated blood loss: none. Impression:               - Preparation of the colon was fair.                           - Diverticulosis in the sigmoid colon.                           - The examination was otherwise normal on direct                            and retroflexion views.                           - No specimens collected.                           Haustral thickening and underdistension account for                            CT appearance of sigmoid colon. Recommendation:           - Patient has a contact number available for                            emergencies. The signs and symptoms of potential                            delayed complications were discussed with the                            patient. Return to normal activities tomorrow.                            Written discharge  instructions were provided to the                            patient.                           - Resume previous diet.                           - Continue present medications.                           - Miralax 1 capful (17 grams) in 8 ounces of water                            PO daily.                           - No repeat colonoscopy.                           -  Return to Dr. Ardis Hughs at GI office as needed. [name], MD 07/02/2020 11:34:51 AM This report has been signed electronically. CC Letter to:             Biagio Borg, MD; Everitt Amber, MD

## 2020-07-02 NOTE — Progress Notes (Signed)
Pt's states no medical or surgical changes since previsit or office visit.  [facility] [place] 

## 2020-07-02 NOTE — Patient Instructions (Addendum)
Miralax 1 capful ( 17 gm ) in 8 ozs water by mouth daily   Return to Dr Ardis Hughs at GI office as needed     Handout on diverticulosis given to you today    YOU HAD AN ENDOSCOPIC PROCEDURE TODAY AT [facility]:   Refer to the procedure report that was given to you for any specific questions about what was found during the examination.  If the procedure report does not answer your questions, please call your gastroenterologist to clarify.  If you requested that your care partner not be given the details of your procedure findings, then the procedure report has been included in a sealed envelope for you to review at your convenience later.  YOU SHOULD EXPECT: Some feelings of bloating in the abdomen. Passage of more gas than usual.  Walking can help get rid of the air that was put into your GI tract during the procedure and reduce the bloating. If you had a lower endoscopy (such as a colonoscopy or flexible sigmoidoscopy) you may notice spotting of blood in your stool or on the toilet paper. If you underwent a bowel prep for your procedure, you may not have a normal bowel movement for a few days.  Please Note:  You might notice some irritation and congestion in your nose or some drainage.  This is from the oxygen used during your procedure.  There is no need for concern and it should clear up in a day or so.  SYMPTOMS TO REPORT IMMEDIATELY:  Following lower endoscopy (colonoscopy or flexible sigmoidoscopy):  Excessive amounts of blood in the stool  Significant tenderness or worsening of abdominal pains  Swelling of the abdomen that is new, acute  Fever of 100F or higher  Following upper endoscopy (EGD)  Vomiting of blood or coffee ground material  New chest pain or pain under the shoulder blades  Painful or persistently difficult swallowing  New shortness of breath  Fever of 100F or higher  Black, tarry-looking stools  For urgent or emergent issues, a  gastroenterologist can be reached at any hour by calling [phone]. Do not use MyChart messaging for urgent concerns.    DIET:  We do recommend a small meal at first, but then you may proceed to your regular diet.  Drink plenty of fluids but you should avoid alcoholic beverages for 24 hours.  ACTIVITY:  You should plan to take it easy for the rest of today and you should NOT DRIVE or use heavy machinery until tomorrow (because of the sedation medicines used during the test).    FOLLOW UP: Our staff will call the number listed on your records 48-72 hours following your procedure to check on you and address any questions or concerns that you may have regarding the information given to you following your procedure. If we do not reach you, we will leave a message.  We will attempt to reach you two times.  During this call, we will ask if you have developed any symptoms of COVID 19. If you develop any symptoms (ie: fever, flu-like symptoms, shortness of breath, cough etc.) before then, please call [phone].  If you test positive for Covid 19 in the 2 weeks post procedure, please call and report this information to [place].    If any biopsies were taken you will be contacted by phone or by letter within the next 1-3 weeks.  Please call us at [phone] if you have not heard about  the biopsies in 3 weeks.    SIGNATURES/CONFIDENTIALITY: You and/or your care partner have signed paperwork which will be entered into your electronic medical record.  These signatures attest to the fact that that the information above on your After Visit Summary has been reviewed and is understood.  Full responsibility of the confidentiality of this discharge information lies with you and/or your care-partner.

## 2020-07-02 NOTE — Progress Notes (Signed)
Report given to PACU, vss 

## 2020-07-04 ENCOUNTER — Telehealth: Payer: Self-pay

## 2020-07-04 NOTE — Telephone Encounter (Signed)
  Follow up Call-  Call back number 07/02/2020  Post procedure Call Back phone  # [phone]  Permission to leave phone message Yes  Some recent data might be hidden     Patient questions:  Do you have a fever, pain , or abdominal swelling? No. Pain Score  0 *  Have you tolerated food without any problems? Yes.    Have you been able to return to your normal activities? Yes.    Do you have any questions about your discharge instructions: Diet   No. Medications  No. Follow up visit  No.  Do you have questions or concerns about your Care? No.  Actions: * If pain score is 4 or above: No action needed, pain <4.  Have you developed a fever since your procedure? no  2.   Have you had an respiratory symptoms (SOB or cough) since your procedure? no  3.   Have you tested positive for COVID 19 since your procedure no  4.   Have you had any family members/close contacts diagnosed with the COVID 19 since your procedure?  no   If yes to any of these questions please route to Joylene John, RN and Joella Prince, RN

## 2020-07-05 ENCOUNTER — Other Ambulatory Visit: Payer: Self-pay

## 2020-07-05 ENCOUNTER — Ambulatory Visit: Payer: Medicare Other | Admitting: *Deleted

## 2020-07-05 DIAGNOSIS — M542 Cervicalgia: Secondary | ICD-10-CM

## 2020-07-05 DIAGNOSIS — G8929 Other chronic pain: Secondary | ICD-10-CM

## 2020-07-05 DIAGNOSIS — M25512 Pain in left shoulder: Secondary | ICD-10-CM | POA: Diagnosis not present

## 2020-07-05 DIAGNOSIS — M25612 Stiffness of left shoulder, not elsewhere classified: Secondary | ICD-10-CM | POA: Diagnosis not present

## 2020-07-05 NOTE — Therapy (Signed)
[facility] [place], [place], [address] Phone: [phone]   Fax:  [phone]  Physical Therapy Treatment  Patient Details  Name: Laura [name] MRN: [id number] Date of Birth: December 12, 1945 Referring Provider (PT): Basil Dess MD   Encounter Date: 07/05/2020   PT End of Session - 07/05/20 1038     Visit Number 10    Number of Visits 12    Date for PT Re-Evaluation 08/23/20    Authorization Type FOTO AT LEAST EVERY 5TH VISIT.  PROGRESS NOTE AT 10TH VISIT.  KX MODIFIER AFTER 15 VISITS.    10th visit FOTO 37%    PT Start Time 1035    PT Stop Time 1124    PT Time Calculation (min) 49 min             Past Medical History:  Diagnosis Date   Anemia    Anxiety    Arthritis    back of neck, bones spurs on neck   Cancer (HCC)    ovarian cancer   Cataract    Cervical disc disease    Diabetes mellitus    Family history of thyroid cancer    Heart murmur    Hyperlipidemia    Hypertension    Mucoid cyst of joint    right thumb   Neuropathy    Port-A-Cath in place 07/21/2019   Reflux    Sleep apnea    wears CPAP nightly   Vertigo     Past Surgical History:  Procedure Laterality Date   ABDOMINAL HYSTERECTOMY     vaginal   BLADDER SURGERY     BREAST EXCISIONAL BIOPSY Bilateral    BREAST SURGERY     CHOLECYSTECTOMY     COLONOSCOPY     fibroids removed     breast (both breasts)   IR IMAGING GUIDED PORT INSERTION  07/26/2019   right   MASS EXCISION Right 06/26/2016   Procedure: EXCISION MUCOID TUMOR RIGHT THUMB, IP RIGHT THUMB;  Surgeon: Daryll Brod, MD;  Location: [facility];  Service: Orthopedics;  Laterality: Right;   PARTIAL HYSTERECTOMY     ROBOTIC ASSISTED BILATERAL SALPINGO OOPHERECTOMY N/A 01/03/2020   Procedure: XI ROBOTIC ASSISTED BILATERAL SALPINGO OOPHORECTOMY, RADICAL TUMOR DEBULKING;  Surgeon: Everitt Amber, MD;  Location: [facility];  Service: Gynecology;  Laterality: N/A;   ROBOTIC PELVIC  AND PARA-AORTIC LYMPH NODE DISSECTION N/A 01/03/2020   Procedure: XI ROBOTIC PARA-AORTIC LYMPHADENECTOMY;  Surgeon: Everitt Amber, MD;  Location: [facility];  Service: Gynecology;  Laterality: N/A;    There were no vitals filed for this visit.   Subjective Assessment - 07/05/20 1036     Subjective COVID-19 screen performed prior to patient entering clinic. Much better after last Rx, but having some mm spasms in both UTs at times    Pertinent History Ovarian cancer ("cancer-free".), DM, neuropathy, cervical disc disease, OA, HTN.    How long can you sit comfortably? Varies.    Patient Stated Goals Reduce pain and get more motion in left shoulder.  Become more active.    Currently in Pain? Yes    Pain Score 5     Pain Location Shoulder    Pain Orientation Left    Pain Descriptors / Indicators Sore    Pain Type Chronic pain                               [facility] Adult PT  Treatment/Exercise - 07/05/20 0001       Exercises   Exercises Shoulder      Shoulder Exercises: Pulleys   Flexion 5 minutes      Shoulder Exercises: ROM/Strengthening   UBE (Upper Arm Bike) x 5 mins 120RPMs      Modalities   Modalities Traction      Traction   Type of Traction Cervical    Min (lbs) 5    Max (lbs) 10    Hold Time 90    Rest Time 5    Time 15      Manual Therapy   Manual Therapy Soft tissue mobilization    Manual therapy comments ER ROM 40 degrees    Soft tissue mobilization STW and TPR  to LT and RT UT and levator scap with good release both sides  with decreased pain.    Passive ROM LT shldrPROM / AAROM and IR / ER with end-range holds.                         PT Long Term Goals - 07/05/20 1128       PT LONG TERM GOAL #1   Title Perform ADL's with pain not > 3/10.    Time 6    Period Weeks    Status Partially Met      PT LONG TERM GOAL #2   Title Active left shoulder flexion to 125 degrees so the patient can easily reach overhead    Period Weeks     Status Partially Met      PT LONG TERM GOAL #3   Title Left shoulder ER to 55 degrees.    Baseline 45 degrees 07/05/20    Time 6    Period Weeks    Status On-going      PT LONG TERM GOAL #4   Title Bilateral active cervical rotation to 60 degrees+.    Time 6    Period Weeks    Status On-going                   Plan - 07/05/20 1038     Clinical Impression Statement Pt arrived today with some increased pain/ tightness in Bil UT's and shldrs. She did well with exs and STW/TPR with decreased tension and pain end of session. She did better with LT shldr ER today to 40 degrees. 10 th visit FOTO  37%    Personal Factors and Comorbidities Comorbidity 1;Other    Comorbidities Ovarian cancer ("cancer-free".), DM, neuropathy, cervical disc disease, OA, HTN.    Examination-Activity Limitations Other;Reach Overhead    Examination-Participation Restrictions Other    Stability/Clinical Decision Making Evolving/Moderate complexity    Rehab Potential Good    PT Frequency 2x / week    PT Duration 6 weeks    PT Treatment/Interventions ADLs/Self Care Home Management;Cryotherapy;Traction;Moist Heat;Functional mobility training;Therapeutic activities;Therapeutic exercise;Manual techniques;Patient/family education;Passive range of motion    Consulted and Agree with Plan of Care Patient             Patient will benefit from skilled therapeutic intervention in order to improve the following deficits and impairments:  Pain, Decreased activity tolerance, Decreased range of motion, Decreased strength, Increased muscle spasms, Postural dysfunction  Visit Diagnosis: Cervicalgia  Chronic left shoulder pain  Stiffness of left shoulder, not elsewhere classified     Problem List Patient Active Problem List   Diagnosis Date Noted   Abnormal CT scan, colon 06/28/2020  Left leg pain 05/19/2020   Statin myopathy 02/27/2020   Ovarian cancer ([place]) 01/03/2020   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   Nontraumatic tear of right tibialis posterior tendon 12/14/2019   Right ankle pain 12/07/2019   Right ankle tendonitis 11/11/2019   Aortic atherosclerosis ([place]) 10/12/2019   Dehydration 10/06/2019   Port-A-Cath in place 07/21/2019   Genetic testing 07/15/2019   Elevated CA-125 07/07/2019   Family history of thyroid cancer    Malignant neoplasm of both ovaries 06/20/2019   Paronychia, finger, left 04/15/2019   Vaginitis 04/15/2019   Abdominal pain 04/04/2019   Retroperitoneal mass 04/03/2019   Acute gouty arthritis 12/17/2018   Bilateral hearing loss 12/21/2017   Eustachian tube disorder, bilateral 12/21/2017   Cough 02/23/2017   Left hand pain 05/26/2016   Mucoid cyst, joint 05/26/2016   Pain in finger of right hand 05/26/2016   Primary osteoarthritis of both first carpometacarpal joints 05/26/2016   Skin avulsion 05/26/2016   Adhesive capsulitis of left shoulder 03/20/2016   Fatigue 12/16/2015   Acute bronchitis 10/31/2015   Urinary frequency 03/09/2015   Hearing loss, sensorineural, asymmetrical 11/09/2014   Vestibular hypofunction, left 11/09/2014   Obesity 07/25/2014   Orthostatic hypotension 07/07/2014   Upper airway cough syndrome 06/26/2014   Diarrhea 05/02/2014   Dizziness 04/12/2014   Neuropathy    Rash and nonspecific skin eruption 08/30/2013   Paresthesia of both feet 08/30/2013   Blurred vision 02/15/2013   Eye pain 02/15/2013   Sinusitis, chronic 10/07/2012   Left shoulder pain 04/15/2012   Encounter for well adult exam with abnormal findings 10/22/2011   Cervical disc disease 10/22/2011   Bilateral hand pain 08/05/2011   SNHL (sensorineural hearing loss) 08/04/2011   Fibrocystic breast disease 07/02/2011   Hypertension 07/01/2011   GERD (gastroesophageal reflux disease) 06/29/2011   Bloating 06/29/2011   Back pain 03/13/2011   Vertigo 01/04/2011   Nystagmus 01/04/2011   Insomnia 10/21/2010   Other  constipation 08/17/2009   Vitamin D deficiency 05/15/2009   DYSPNEA 03/13/2009   Depression with anxiety 01/16/2009   Irritable bowel syndrome 01/16/2009   Primary osteoarthritis of both hands 12/15/2008   Diabetes mellitus type 2, noninsulin dependent ([place]) 11/03/2008   Hyperlipidemia 11/03/2008   Anxiety state 11/03/2008   OVERACTIVE BLADDER 11/03/2008   OSA (obstructive sleep apnea) 11/03/2008   MURMUR 11/03/2008    Haislee Corso,CHRIS, PTA 07/05/2020, 11:41 AM  [facility] [address] [place], [place], [address] Phone: [phone]   Fax:  [phone]  Name: LARRY KNIPP MRN: [id number] Date of Birth: Jul 29, 1945

## 2020-07-07 ENCOUNTER — Emergency Department (HOSPITAL_COMMUNITY): Payer: Medicare Other

## 2020-07-07 ENCOUNTER — Encounter (HOSPITAL_COMMUNITY): Payer: Self-pay

## 2020-07-07 ENCOUNTER — Other Ambulatory Visit: Payer: Self-pay

## 2020-07-07 ENCOUNTER — Emergency Department (HOSPITAL_COMMUNITY)
Admission: EM | Admit: 2020-07-07 | Discharge: 2020-07-07 | Disposition: A | Discharge status: Home / Self Care | Payer: Medicare Other | Attending: Emergency Medicine | Admitting: Emergency Medicine

## 2020-07-07 DIAGNOSIS — I10 Essential (primary) hypertension: Secondary | ICD-10-CM | POA: Insufficient documentation

## 2020-07-07 DIAGNOSIS — Z7984 Long term (current) use of oral hypoglycemic drugs: Secondary | ICD-10-CM | POA: Insufficient documentation

## 2020-07-07 DIAGNOSIS — E119 Type 2 diabetes mellitus without complications: Secondary | ICD-10-CM | POA: Diagnosis not present

## 2020-07-07 DIAGNOSIS — M549 Dorsalgia, unspecified: Secondary | ICD-10-CM | POA: Diagnosis not present

## 2020-07-07 DIAGNOSIS — R350 Frequency of micturition: Secondary | ICD-10-CM | POA: Diagnosis not present

## 2020-07-07 DIAGNOSIS — M542 Cervicalgia: Secondary | ICD-10-CM | POA: Insufficient documentation

## 2020-07-07 DIAGNOSIS — R1031 Right lower quadrant pain: Secondary | ICD-10-CM | POA: Diagnosis not present

## 2020-07-07 DIAGNOSIS — R3915 Urgency of urination: Secondary | ICD-10-CM | POA: Diagnosis not present

## 2020-07-07 DIAGNOSIS — Z79899 Other long term (current) drug therapy: Secondary | ICD-10-CM | POA: Insufficient documentation

## 2020-07-07 DIAGNOSIS — R000 Tachycardia, unspecified: Secondary | ICD-10-CM | POA: Insufficient documentation

## 2020-07-07 DIAGNOSIS — U071 COVID-19: Secondary | ICD-10-CM | POA: Diagnosis not present

## 2020-07-07 DIAGNOSIS — Z8543 Personal history of malignant neoplasm of ovary: Secondary | ICD-10-CM | POA: Insufficient documentation

## 2020-07-07 LAB — CBC WITH DIFFERENTIAL/PLATELET
Abs Immature Granulocytes: 0.04 10*3/uL (ref 0.00–0.07)
Basophils Absolute: 0 10*3/uL (ref 0.0–0.1)
Basophils Relative: 0 %
Eosinophils Absolute: 0.1 10*3/uL (ref 0.0–0.5)
Eosinophils Relative: 1 %
HCT: 33.8 % — ABNORMAL LOW (ref 36.0–46.0)
Hemoglobin: 11.5 g/dL — ABNORMAL LOW (ref 12.0–15.0)
Immature Granulocytes: 1 %
Lymphocytes Relative: 11 %
Lymphs Abs: 0.7 10*3/uL (ref 0.7–4.0)
MCH: 30.9 pg (ref 26.0–34.0)
MCHC: 34 g/dL (ref 30.0–36.0)
MCV: 90.9 fL (ref 80.0–100.0)
Monocytes Absolute: 0.8 10*3/uL (ref 0.1–1.0)
Monocytes Relative: 12 %
Neutro Abs: 4.8 10*3/uL (ref 1.7–7.7)
Neutrophils Relative %: 75 %
Platelets: 165 10*3/uL (ref 150–400)
RBC: 3.72 MIL/uL — ABNORMAL LOW (ref 3.87–5.11)
RDW: 13.3 % (ref 11.5–15.5)
WBC: 6.4 10*3/uL (ref 4.0–10.5)
nRBC: 0 % (ref 0.0–0.2)

## 2020-07-07 LAB — COMPREHENSIVE METABOLIC PANEL
ALT: 20 U/L (ref 0–44)
AST: 26 U/L (ref 15–41)
Albumin: 4.4 g/dL (ref 3.5–5.0)
Alkaline Phosphatase: 59 U/L (ref 38–126)
Anion gap: 10 (ref 5–15)
BUN: 16 mg/dL (ref 8–23)
CO2: 23 mmol/L (ref 22–32)
Calcium: 9.6 mg/dL (ref 8.9–10.3)
Chloride: 101 mmol/L (ref 98–111)
Creatinine, Ser: 0.94 mg/dL (ref 0.44–1.00)
GFR, Estimated: >60 mL/min (ref >60)
Glucose, Bld: 135 mg/dL — ABNORMAL HIGH (ref 70–99)
Potassium: 4 mmol/L (ref 3.5–5.1)
Sodium: 134 mmol/L — ABNORMAL LOW (ref 135–145)
Total Bilirubin: 0.4 mg/dL (ref 0.3–1.2)
Total Protein: 7.1 g/dL (ref 6.5–8.1)

## 2020-07-07 LAB — URINALYSIS, ROUTINE W REFLEX MICROSCOPIC
Bilirubin Urine: NEGATIVE
Glucose, UA: NEGATIVE mg/dL
Hgb urine dipstick: NEGATIVE
Ketones, ur: NEGATIVE mg/dL
Leukocytes,Ua: NEGATIVE
Nitrite: NEGATIVE
Protein, ur: NEGATIVE mg/dL
Specific Gravity, Urine: 1.012 (ref 1.005–1.030)
pH: 7 (ref 5.0–8.0)

## 2020-07-07 LAB — LACTIC ACID, PLASMA
Lactic Acid, Venous: 1.5 mmol/L (ref 0.5–1.9)
Lactic Acid, Venous: 0.8 mmol/L (ref 0.5–1.9)

## 2020-07-07 LAB — RESP PANEL BY RT-PCR (FLU A&B, COVID) ARPGX2
Influenza A by PCR: NEGATIVE
Influenza B by PCR: NEGATIVE
SARS Coronavirus 2 by RT PCR: POSITIVE — AB

## 2020-07-07 MED ORDER — FENTANYL CITRATE (PF) 100 MCG/2ML IJ SOLN
50.0000 ug | Freq: Once | INTRAMUSCULAR | Status: AC
  Administered 2020-07-07: 50 ug via INTRAVENOUS
  Filled 2020-07-07: qty 2

## 2020-07-07 MED ORDER — LACTATED RINGERS IV BOLUS (SEPSIS)
1000.0000 mL | Freq: Once | INTRAVENOUS | Status: AC
  Administered 2020-07-07: 1000 mL via INTRAVENOUS

## 2020-07-07 MED ORDER — ACETAMINOPHEN 325 MG PO TABS
650.0000 mg | ORAL_TABLET | Freq: Once | ORAL | Status: AC
  Administered 2020-07-07: 650 mg via ORAL
  Filled 2020-07-07: qty 2

## 2020-07-07 MED ORDER — ONDANSETRON HCL 4 MG/2ML IJ SOLN
4.0000 mg | Freq: Once | INTRAMUSCULAR | Status: AC
  Administered 2020-07-07: 4 mg via INTRAVENOUS
  Filled 2020-07-07: qty 2

## 2020-07-07 MED ORDER — IOHEXOL 300 MG/ML  SOLN
100.0000 mL | Freq: Once | INTRAMUSCULAR | Status: AC | PRN
  Administered 2020-07-07: 100 mL via INTRAVENOUS

## 2020-07-07 MED ORDER — TRAMADOL HCL 50 MG PO TABS: 50.0000 mg | ORAL_TABLET | Freq: Two times a day (BID) | ORAL | 0 refills | Status: DC | PRN

## 2020-07-07 NOTE — ED Provider Notes (Signed)
[facility] EMERGENCY DEPARTMENT Provider Note  CSN: [id number] Arrival date & time: 07/07/20 1062    History Chief Complaint  Patient presents with   Abdominal Pain     Flank Pain   Laura [name] is a 75 y.o. female with a history of ovarian cancer, completed treatment in November 2021, recently had a surveillance CT which showed some sigmoid colon thickening prompting a colonoscopy which was done earlier this week but negative. She was doing well post-procedure but began to have some abdominal pain and neck/back pain last night. She has a history of neck/back problems anyway and didn't think much of it but this morning pain has been worse and is mostly in her RLQ. She has had urinary urgency and frequency but not much urine comes out. No burning with urination. She did not know she was running a fever until she arrived here.    Past Medical History:  Diagnosis Date   Anemia    Anxiety    Arthritis    back of neck, bones spurs on neck   Cancer (HCC)    ovarian cancer   Cataract    Cervical disc disease    Diabetes mellitus    Family history of thyroid cancer    Heart murmur    Hyperlipidemia    Hypertension    Mucoid cyst of joint    right thumb   Neuropathy    Port-A-Cath in place 07/21/2019   Reflux    Sleep apnea    wears CPAP nightly   Vertigo     Past Surgical History:  Procedure Laterality Date   ABDOMINAL HYSTERECTOMY     vaginal   BLADDER SURGERY     BREAST EXCISIONAL BIOPSY Bilateral    BREAST SURGERY     CHOLECYSTECTOMY     COLONOSCOPY     fibroids removed     breast (both breasts)   IR IMAGING GUIDED PORT INSERTION  07/26/2019   right   MASS EXCISION Right 06/26/2016   Procedure: EXCISION MUCOID TUMOR RIGHT THUMB, IP RIGHT THUMB;  Surgeon: Daryll Brod, MD;  Location: [place] [facility];  Service: Orthopedics;  Laterality: Right;   PARTIAL HYSTERECTOMY     ROBOTIC ASSISTED BILATERAL SALPINGO OOPHERECTOMY N/A 01/03/2020   Procedure: XI  ROBOTIC ASSISTED BILATERAL SALPINGO OOPHORECTOMY, RADICAL TUMOR DEBULKING;  Surgeon: Everitt Amber, MD;  Location: [facility];  Service: Gynecology;  Laterality: N/A;   ROBOTIC PELVIC AND PARA-AORTIC LYMPH NODE DISSECTION N/A 01/03/2020   Procedure: XI ROBOTIC PARA-AORTIC LYMPHADENECTOMY;  Surgeon: Everitt Amber, MD;  Location: [facility];  Service: Gynecology;  Laterality: N/A;    Family History  Problem Relation Age of Onset   Hypertension Other    Diabetes Father    Hypertension Father    Diabetes Sister    Hypertension Mother    Hypertension Sister    Stroke Sister    Diabetes Maternal Aunt    Thyroid cancer Daughter 28   Colon cancer Neg Hx    Esophageal cancer Neg Hx    Stomach cancer Neg Hx    Rectal cancer Neg Hx    Breast cancer Neg Hx    Endometrial cancer Neg Hx    Ovarian cancer Neg Hx     Social History   Tobacco Use   Smoking status: Never   Smokeless tobacco: Never  Vaping Use   Vaping Use: Never used  Substance Use Topics   Alcohol use: No    Alcohol/week: 0.0 standard drinks  Drug use: No     Home Medications Prior to Admission medications   Medication Sig Start Date End Date Taking? Authorizing Provider  acetaminophen (TYLENOL) 500 MG tablet Take 1 tablet (500 mg total) by mouth every 6 (six) hours as needed. 05/16/20   Jessy Oto, MD  amLODipine (NORVASC) 5 MG tablet TAKE 1 TABLET (5 MG TOTAL) BY MOUTH DAILY. 02/09/20 02/08/21  Biagio Borg, MD  Blood Glucose Monitoring Suppl (ONE TOUCH ULTRA 2) w/Device KIT Use as directed 04/06/15   Biagio Borg, MD  Camphor-Eucalyptus-Menthol [facility] VAPORUB EX) Apply 1 application topically.    [provider]  Cholecalciferol (VITAMIN D3) 125 MCG (5000 UT) TABS Take 10,000-15,000 Units by mouth daily.    [provider]  eszopiclone (LUNESTA) 2 MG TABS tablet Take 1 tablet (2 mg total) by mouth at bedtime as needed for sleep. Take immediately before bedtime 06/05/20   Biagio Borg, MD  famotidine (PEPCID)  20 MG tablet Take 20 mg by mouth 2 (two) times daily as needed for heartburn or indigestion.    [provider]  feeding supplement (BOOST HIGH PROTEIN) LIQD Take 237 mLs by mouth daily.    [provider]  glimepiride (AMARYL) 2 MG tablet TAKE ONE TABLET BY MOUTH DAILY Patient taking differently: Take 2 mg by mouth daily with breakfast. 08/17/19 08/16/20  Biagio Borg, MD  irbesartan (AVAPRO) 75 MG tablet Take 1 tablet (75 mg total) by mouth daily. 05/16/20   Biagio Borg, MD  Lancets [facility] ULTRASOFT) lancets USE TO CHECK BLOOD SUGAR TWICE DAILY 06/27/19   Biagio Borg, MD  loperamide (IMODIUM) 2 MG capsule Take 2 mg by mouth as needed for diarrhea or loose stools.     [provider]  loratadine (CLARITIN) 10 MG tablet Take 10 mg by mouth daily as needed (allergies.).     [provider]  LORazepam (ATIVAN) 0.5 MG tablet Take 1 tablet (0.5 mg total) by mouth 2 (two) times daily as needed for anxiety. 05/16/20   Biagio Borg, MD  [facility] ULTRA test strip USE TO CHECK BLOOD SUGARS TWO TIMES DAILY 06/27/19   Biagio Borg, MD  OVER THE COUNTER MEDICATION Apply 1 application topically daily as needed (pain). Triderma otc pain cream    [provider]  prochlorperazine (COMPAZINE) 10 MG tablet Take 10 mg by mouth every 6 (six) hours as needed for nausea or vomiting.    [provider]  solifenacin (VESICARE) 5 MG tablet TAKE 1 TABLET (5 MG TOTAL) BY MOUTH DAILY. Patient taking differently: Take 5 mg by mouth daily as needed (bladder pain/spasms.). 10/19/19 10/18/20  Biagio Borg, MD  traMADol (ULTRAM) 50 MG tablet Take 1 tablet (50 mg total) by mouth 2 (two) times daily as needed for moderate pain (back pain). 07/07/20   Truddie Hidden, MD  vitamin B-12 (CYANOCOBALAMIN) 1000 MCG tablet Take 1,000 mcg by mouth daily.     [provider]     Allergies    Ciprofloxacin, Hydrocodone bit-homatrop mbr, Sulfa antibiotics, Augmentin  [amoxicillin-pot clavulanate], Codeine, Crestor [rosuvastatin calcium], Doxycycline, Gabapentin, Keflex [cephalexin], Naproxen, Statins, and Prednisone   Review of Systems   Review of Systems A comprehensive review of systems was completed and negative except as noted in HPI.    Physical Exam BP (!) 153/78   Pulse 80   Temp 98.8 F (37.1 C) (Oral)   Resp 18   Ht 5' 4"  (1.626 m)  Wt 87.1 kg   SpO2 95%   BMI 32.96 kg/m   Physical Exam Vitals and nursing note reviewed.  Constitutional:      Appearance: Normal appearance.  HENT:     Head: Normocephalic and atraumatic.     Nose: Nose normal.     Mouth/Throat:     Mouth: Mucous membranes are moist.  Eyes:     Extraocular Movements: Extraocular movements intact.     Conjunctiva/sclera: Conjunctivae normal.  Cardiovascular:     Rate and Rhythm: Tachycardia present.  Pulmonary:     Effort: Pulmonary effort is normal.     Breath sounds: Normal breath sounds.  Abdominal:     General: Abdomen is flat.     Palpations: Abdomen is soft.     Tenderness: There is abdominal tenderness (mild RLQ). There is no guarding or rebound.  Musculoskeletal:        General: No swelling. Normal range of motion.     Cervical back: Neck supple.  Skin:    General: Skin is warm and dry.  Neurological:     General: No focal deficit present.     Mental Status: She is alert.  Psychiatric:        Mood and Affect: Mood normal.     ED Results / Procedures / Treatments   Labs (all labs ordered are listed, but only abnormal results are displayed) Labs Reviewed  RESP PANEL BY RT-PCR (FLU A&B, COVID) ARPGX2 - Abnormal; Notable for the following components:      Result Value   SARS Coronavirus 2 by RT PCR POSITIVE (*)    All other components within normal limits  COMPREHENSIVE METABOLIC PANEL - Abnormal; Notable for the following components:   Sodium 134 (*)    Glucose, Bld 135 (*)    All other components within normal limits  CBC WITH  DIFFERENTIAL/PLATELET - Abnormal; Notable for the following components:   RBC 3.72 (*)    Hemoglobin 11.5 (*)    HCT 33.8 (*)    All other components within normal limits  URINE CULTURE  CULTURE, BLOOD (ROUTINE X 2)  CULTURE, BLOOD (ROUTINE X 2)  LACTIC ACID, PLASMA  LACTIC ACID, PLASMA  URINALYSIS, ROUTINE W REFLEX MICROSCOPIC    EKG None  Radiology CT ABDOMEN PELVIS W CONTRAST  Result Date: 07/07/2020 CLINICAL DATA:  Abdominal abscess/infection suspected in a 75 year old female. History of ovarian cancer now with RIGHT lower quadrant pain and difficulty voiding. EXAM: CT ABDOMEN AND PELVIS WITH CONTRAST TECHNIQUE: Multidetector CT imaging of the abdomen and pelvis was performed using the standard protocol following bolus administration of intravenous contrast. CONTRAST:  112m OMNIPAQUE IOHEXOL 300 MG/ML  SOLN COMPARISON:  May 30, 2020.  May 01, 2020. FINDINGS: Lower chest: No effusion.  No consolidation. Hepatobiliary: No focal, suspicious hepatic lesion. No biliary duct dilation following cholecystectomy. Pancreas: Normal, without mass, inflammation or ductal dilatation. Spleen: Spleen normal size and contour. Adrenals/Urinary Tract: Adrenal gland are normal. Symmetric renal enhancement. No hydronephrosis. Minimal RIGHT ureteral distension is similar to the prior examination. No perinephric stranding. Urinary bladder with smooth contours and signs of cystocele. Symmetric excretion from the bilateral kidneys into the ureters with contrast seen just beyond a mass in the retroperitoneum in the intra-aortocaval groove adjacent to the RIGHT ureter. Stomach/Bowel: Moderate hiatal hernia. Small bowel is normal caliber without signs of adjacent inflammation. Stool fills much of the colon without sign of obstruction or evidence of inflammation. The appendix is normal. Vascular/Lymphatic: Normal caliber of the abdominal  aorta. Calcified and noncalcified atheromatous plaque. Patent abdominal vessels.  Stable appearance of intra-aortocaval mass/conglomerate nodal enlargement measuring approximately 3.2 x 2.5 cm. LEFT retroperitoneal, periaortic adenopathy may be slightly diminished in size at approximately 18 mm as compared to 22 mm. No new upper abdominal or retroperitoneal adenopathy. No pelvic lymphadenopathy. Reproductive: Post hysterectomy with cystocele. Other: No free air.  No ascites. Musculoskeletal: No acute bone finding. No destructive bone process. Spinal degenerative changes. IMPRESSION: 1. No acute findings in the abdomen or in the pelvis. 2. Slight interval improvement of LEFT periaortic adenopathy with stable appearance of intra-aortocaval lymphadenopathy compared to previous imaging. 3. Post hysterectomy with signs of cystocele, indicating pelvic floor dysfunction. This is unchanged. 4. Moderate hiatal hernia. 5. Aortic atherosclerosis. Aortic Atherosclerosis (ICD10-I70.0). Electronically Signed   By: Zetta Bills M.D.   On: 07/07/2020 09:49   DG Chest Port 1 View  Result Date: 07/07/2020 CLINICAL DATA:  Pt states that she has a hx of ovarian cancer, states that she finished with chemo in November. States that she had a colonoscopy on Monday, states that she started having pain yesterday, then it got better, but then it came back, states that she had some chill last night too. Pt states that she has general body aches with some RLQ pain, pt denies urinary pain, but states that it is hard to void. Hx of diabetes and hypertension. EXAM: PORTABLE CHEST 1 VIEW COMPARISON:  09/19/2019. FINDINGS: Cardiac silhouette is normal in size. No mediastinal or hilar masses. Prominent bronchovascular markings in the lower lungs. Lungs otherwise clear. No convincing pleural effusion and no pneumothorax. Stable right anterior chest wall power Port-A-Cath. Skeletal structures are grossly intact. IMPRESSION: No active disease. Electronically Signed   By: Lajean Manes M.D.   On: 07/07/2020 08:20     Procedures Procedures  Medications Ordered in the ED Medications  lactated ringers bolus 1,000 mL (0 mLs Intravenous Stopped 07/07/20 0856)  fentaNYL (SUBLIMAZE) injection 50 mcg (50 mcg Intravenous Given 07/07/20 0804)  acetaminophen (TYLENOL) tablet 650 mg (650 mg Oral Given 07/07/20 0803)  ondansetron (ZOFRAN) injection 4 mg (4 mg Intravenous Given 07/07/20 0803)  iohexol (OMNIPAQUE) 300 MG/ML solution 100 mL (100 mLs Intravenous Contrast Given 07/07/20 0903)     MDM Rules/Calculators/A&P MDM Patient with abdominal pain, fever and urinary symptoms also recently had a colonoscopy. Will check labs, including lactic acid and cultures, send UA and check CT for signs of complication from scope. Pain meds for comfort.   ED Course  I have reviewed the triage vital signs and the nursing notes.  Pertinent labs & imaging results that were available during my care of the patient were reviewed by me and considered in my medical decision making (see chart for details).  Clinical Course as of 07/07/20 1153  Sat Jul 07, 2020  3149 CBC without leukocytosis. CMP is normal. Lactic acid is negative. UA without signs of infection.  [CS]  7026 CXR is clear [CS]  3785 Covid is confirmed positive which is an explanation for her fever and likely unrelated to the abdominal pain. Her workup is otherwise unremarkable. She was given quarantine instructions. Norco for abdominal pain. PCP follow up.  [CS]    Clinical Course User Index [CS] Truddie Hidden, MD    Final Clinical Impression(s) / ED Diagnoses Final diagnoses:  Right lower quadrant abdominal pain  COVID-19    Rx / DC Orders ED Discharge Orders          Ordered  traMADol (ULTRAM) 50 MG tablet  2 times daily PRN        07/07/20 1153             Truddie Hidden, MD 07/07/20 1153

## 2020-07-07 NOTE — Discharge Instructions (Addendum)
Take pain medications as needed. This may cause some constipation to be sure to take a stool softener while you are using the pain medication.

## 2020-07-07 NOTE — ED Triage Notes (Signed)
Pt to er, per states that she has a hx of ovarian cancer, states that she finished with chemo in November.  States that she had a colonoscopy on Monday, states that she started having pain yesterday, then it got better, but then it came back, states that she had some chill last night too.  Pt states that she has general body aches with some RLQ pain, pt denies urinary pain, but states that it is hard to void.

## 2020-07-08 LAB — URINE CULTURE

## 2020-07-09 ENCOUNTER — Ambulatory Visit: Payer: Medicare Other | Admitting: Physical Therapy

## 2020-07-11 ENCOUNTER — Telehealth (INDEPENDENT_AMBULATORY_CARE_PROVIDER_SITE_OTHER): Payer: Medicare Other | Admitting: Internal Medicine

## 2020-07-11 DIAGNOSIS — E119 Type 2 diabetes mellitus without complications: Secondary | ICD-10-CM | POA: Diagnosis not present

## 2020-07-11 DIAGNOSIS — U071 COVID-19: Secondary | ICD-10-CM

## 2020-07-11 MED ORDER — NIRMATRELVIR/RITONAVIR (PAXLOVID)TABLET: 3.0000 | ORAL_TABLET | Freq: Two times a day (BID) | ORAL | 0 refills | Status: AC

## 2020-07-11 NOTE — Progress Notes (Signed)
Patient ID: Laura [name], female   DOB: 01-21-1945, 75 y.o.   MRN: [id number]  Cumulative time during 7-day interval 14 min, there was not an associated office visit for this concern within a 7 day period.  Verbal consent for services obtained from patient prior to services given.  Names of all persons present for services: Cathlean Cower, MD, patient  Chief complaint: covid +  History, background, results pertinent:  Pt relates having recent colonoscopy June 20, and has been working with PT off an on since then so not sure where she may be have been exposed; Was seen in ED June 25 in ED with abd pain, neck and back pain found to be COVID + when also found to have fever.  Tx with tramadol prn and quarantine x 5 day and tylenol prn.  Has only taken 3 tramadol to date as pain overall not overwhelming, still has original symptoms and now developing diarrhea not well controlled.  Asking for further tx if possible.  Pt denies chest pain, increased sob or doe, wheezing, orthopnea, PND, increased LE swelling, palpitations, dizziness or syncope.   Pt denies polydipsia, polyuria, or new focal neuro s/s.  Denies worsening reflux, abd pain, dysphagia, n/v, or blood.  Pt declines covid vax or booster.  Past Medical History:  Diagnosis Date   Anemia    Anxiety    Arthritis    back of neck, bones spurs on neck   Cancer (HCC)    ovarian cancer   Cataract    Cervical disc disease    Diabetes mellitus    Family history of thyroid cancer    Heart murmur    Hyperlipidemia    Hypertension    Mucoid cyst of joint    right thumb   Neuropathy    Port-A-Cath in place 07/21/2019   Reflux    Sleep apnea    wears CPAP nightly   Vertigo    No results found. However, due to the size of the patient record, not all encounters were searched. Please check Results Review for a complete set of results. Current Outpatient Medications on File Prior to Visit  Medication Sig Dispense Refill   acetaminophen (TYLENOL) 500 MG  tablet Take 1 tablet (500 mg total) by mouth every 6 (six) hours as needed. 30 tablet 0   amLODipine (NORVASC) 5 MG tablet TAKE 1 TABLET (5 MG TOTAL) BY MOUTH DAILY. 90 tablet 3   Blood Glucose Monitoring Suppl (ONE TOUCH ULTRA 2) w/Device KIT Use as directed 1 each 0   Camphor-Eucalyptus-Menthol (VICKS VAPORUB EX) Apply 1 application topically.     Cholecalciferol (VITAMIN D3) 125 MCG (5000 UT) TABS Take 10,000-15,000 Units by mouth daily.     eszopiclone (LUNESTA) 2 MG TABS tablet Take 1 tablet (2 mg total) by mouth at bedtime as needed for sleep. Take immediately before bedtime 90 tablet 1   famotidine (PEPCID) 20 MG tablet Take 20 mg by mouth 2 (two) times daily as needed for heartburn or indigestion.     feeding supplement (BOOST HIGH PROTEIN) LIQD Take 237 mLs by mouth daily.     glimepiride (AMARYL) 2 MG tablet TAKE ONE TABLET BY MOUTH DAILY (Patient taking differently: Take 2 mg by mouth daily with breakfast.) 90 tablet 3   irbesartan (AVAPRO) 75 MG tablet Take 1 tablet (75 mg total) by mouth daily. 90 tablet 3   Lancets (ONETOUCH ULTRASOFT) lancets USE TO CHECK BLOOD SUGAR TWICE DAILY 100 each 3   loperamide (IMODIUM)  2 MG capsule Take 2 mg by mouth as needed for diarrhea or loose stools.      loratadine (CLARITIN) 10 MG tablet Take 10 mg by mouth daily as needed (allergies.).      LORazepam (ATIVAN) 0.5 MG tablet Take 1 tablet (0.5 mg total) by mouth 2 (two) times daily as needed for anxiety. 60 tablet 1   ONETOUCH ULTRA test strip USE TO CHECK BLOOD SUGARS TWO TIMES DAILY 100 strip 3   OVER THE COUNTER MEDICATION Apply 1 application topically daily as needed (pain). Triderma otc pain cream     prochlorperazine (COMPAZINE) 10 MG tablet Take 10 mg by mouth every 6 (six) hours as needed for nausea or vomiting.     solifenacin (VESICARE) 5 MG tablet TAKE 1 TABLET (5 MG TOTAL) BY MOUTH DAILY. (Patient taking differently: Take 5 mg by mouth daily as needed (bladder pain/spasms.).) 90 tablet 3    traMADol (ULTRAM) 50 MG tablet Take 1 tablet (50 mg total) by mouth 2 (two) times daily as needed for moderate pain (back pain). 10 tablet 0   vitamin B-12 (CYANOCOBALAMIN) 1000 MCG tablet Take 1,000 mcg by mouth daily.      No current facility-administered medications on file prior to visit.   Lab Results  Component Value Date   WBC 6.4 07/07/2020   HGB 11.5 (L) 07/07/2020   HCT 33.8 (L) 07/07/2020   PLT 165 07/07/2020   GLUCOSE 135 (H) 07/07/2020   CHOL 270 (H) 02/28/2020   TRIG 286.0 (H) 02/28/2020   HDL 63.20 02/28/2020   LDLDIRECT 172.0 02/28/2020   LDLCALC 166 (H) 04/12/2014   ALT 20 07/07/2020   AST 26 07/07/2020   NA 134 (L) 07/07/2020   K 4.0 07/07/2020   CL 101 07/07/2020   CREATININE 0.94 07/07/2020   BUN 16 07/07/2020   CO2 23 07/07/2020   TSH 2.92 02/28/2020   INR 1.0 06/06/2019   HGBA1C 6.0 02/28/2020   MICROALBUR 1.4 02/28/2020    A/P/next steps:   Covid infection - ok to continue tramadol prn, quarantine, tylenol prn, and add paxlovid course, immodium prn,  to f/u any worsening symptoms or concerns.  Pt declines repeat labs or imaging for now.  DM - stable by hx, cont same tx  Cathlean Cower MD

## 2020-07-12 ENCOUNTER — Encounter: Payer: Medicare Other | Admitting: Physical Therapy

## 2020-07-12 LAB — CULTURE, BLOOD (ROUTINE X 2)
Culture: NO GROWTH
Culture: NO GROWTH
Special Requests: ADEQUATE
Special Requests: ADEQUATE

## 2020-07-14 ENCOUNTER — Other Ambulatory Visit: Payer: Self-pay

## 2020-07-14 ENCOUNTER — Ambulatory Visit (HOSPITAL_COMMUNITY)
Admission: EM | Admit: 2020-07-14 | Discharge: 2020-07-14 | Disposition: A | Discharge status: Home / Self Care | Payer: Medicare Other | Attending: Family Medicine | Admitting: Family Medicine

## 2020-07-14 ENCOUNTER — Encounter (HOSPITAL_COMMUNITY): Payer: Self-pay | Admitting: *Deleted

## 2020-07-14 DIAGNOSIS — R058 Other specified cough: Secondary | ICD-10-CM

## 2020-07-14 DIAGNOSIS — U071 COVID-19: Secondary | ICD-10-CM

## 2020-07-14 MED ORDER — ALBUTEROL SULFATE HFA 108 (90 BASE) MCG/ACT IN AERS
INHALATION_SPRAY | RESPIRATORY_TRACT | Status: AC
  Filled 2020-07-14: qty 6.7

## 2020-07-14 MED ORDER — ALBUTEROL SULFATE HFA 108 (90 BASE) MCG/ACT IN AERS
2.0000 | INHALATION_SPRAY | Freq: Once | RESPIRATORY_TRACT | Status: AC
  Administered 2020-07-14: 2 via RESPIRATORY_TRACT

## 2020-07-14 NOTE — ED Provider Notes (Signed)
[facility]   [id number] 07/14/20 Arrival Time: 8315   CC: COVID symptoms  SUBJECTIVE: History from: patient.  Laura [name] is a 75 y.o. female who is Covid positive for the last week. Reports that she feels like she was wheezing last night. Denies sick exposure to COVID, flu or strep. Denies recent travel. Has positive history of Covid on incidental finding in the ER on 07/07/20. Has not completed Covid vaccines. Has not taken OTC medications for this. There are no aggravating or alleviating factors. Denies previous symptoms in the past. Denies fever, chills, fatigue, sinus pain, rhinorrhea, sore throat, SOB, chest pain, nausea, changes in bowel or bladder habits.    ROS: As per HPI.  All other pertinent ROS negative.     Past Medical History:  Diagnosis Date   Anemia    Anxiety    Arthritis    back of neck, bones spurs on neck   Cancer (HCC)    ovarian cancer   Cataract    Cervical disc disease    Diabetes mellitus    Family history of thyroid cancer    Heart murmur    Hyperlipidemia    Hypertension    Mucoid cyst of joint    right thumb   Neuropathy    Port-A-Cath in place 07/21/2019   Reflux    Sleep apnea    wears CPAP nightly   Vertigo    Past Surgical History:  Procedure Laterality Date   ABDOMINAL HYSTERECTOMY     vaginal   BLADDER SURGERY     BREAST EXCISIONAL BIOPSY Bilateral    BREAST SURGERY     CHOLECYSTECTOMY     COLONOSCOPY     fibroids removed     breast (both breasts)   IR IMAGING GUIDED PORT INSERTION  07/26/2019   right   MASS EXCISION Right 06/26/2016   Procedure: EXCISION MUCOID TUMOR RIGHT THUMB, IP RIGHT THUMB;  Surgeon: Daryll Brod, MD;  Location: [facility];  Service: Orthopedics;  Laterality: Right;   PARTIAL HYSTERECTOMY     ROBOTIC ASSISTED BILATERAL SALPINGO OOPHERECTOMY N/A 01/03/2020   Procedure: XI ROBOTIC ASSISTED BILATERAL SALPINGO OOPHORECTOMY, RADICAL TUMOR DEBULKING;  Surgeon: Everitt Amber, MD;   Location: [facility];  Service: Gynecology;  Laterality: N/A;   ROBOTIC PELVIC AND PARA-AORTIC LYMPH NODE DISSECTION N/A 01/03/2020   Procedure: XI ROBOTIC PARA-AORTIC LYMPHADENECTOMY;  Surgeon: Everitt Amber, MD;  Location: [facility];  Service: Gynecology;  Laterality: N/A;   Allergies  Allergen Reactions   Ciprofloxacin Swelling    Torn tendon   Hydrocodone Bit-Homatrop Mbr Other (See Comments)    Vertigo *pt strongly prefers to never take*   Sulfa Antibiotics Hives, Itching and Swelling    Tongue swells   Augmentin [Amoxicillin-Pot Clavulanate]     Diarrhea; can take PCN/ Amoxicillin   Codeine Itching   Crestor [Rosuvastatin Calcium] Other (See Comments)    Did something to memory     Doxycycline Other (See Comments)    Severe rectal Gas.   Gabapentin     disoriented   Keflex [Cephalexin] Diarrhea and Nausea And Vomiting   Naproxen Other (See Comments)    Stomach cramps   Statins     Muscle weakness   Prednisone Anxiety    *pt strongly prefers to never be given prednisone*    No current facility-administered medications on file prior to encounter.   Current Outpatient Medications on File Prior to Encounter  Medication Sig Dispense Refill   acetaminophen (TYLENOL) 500  MG tablet Take 1 tablet (500 mg total) by mouth every 6 (six) hours as needed. 30 tablet 0   amLODipine (NORVASC) 5 MG tablet TAKE 1 TABLET (5 MG TOTAL) BY MOUTH DAILY. 90 tablet 3   Blood Glucose Monitoring Suppl (ONE TOUCH ULTRA 2) w/Device KIT Use as directed 1 each 0   Camphor-Eucalyptus-Menthol (VICKS VAPORUB EX) Apply 1 application topically.     Cholecalciferol (VITAMIN D3) 125 MCG (5000 UT) TABS Take 10,000-15,000 Units by mouth daily.     eszopiclone (LUNESTA) 2 MG TABS tablet Take 1 tablet (2 mg total) by mouth at bedtime as needed for sleep. Take immediately before bedtime 90 tablet 1   famotidine (PEPCID) 20 MG tablet Take 20 mg by mouth 2 (two) times daily as needed for heartburn or indigestion.      feeding supplement (BOOST HIGH PROTEIN) LIQD Take 237 mLs by mouth daily.     glimepiride (AMARYL) 2 MG tablet TAKE ONE TABLET BY MOUTH DAILY (Patient taking differently: Take 2 mg by mouth daily with breakfast.) 90 tablet 3   irbesartan (AVAPRO) 75 MG tablet Take 1 tablet (75 mg total) by mouth daily. 90 tablet 3   Lancets (ONETOUCH ULTRASOFT) lancets USE TO CHECK BLOOD SUGAR TWICE DAILY 100 each 3   loperamide (IMODIUM) 2 MG capsule Take 2 mg by mouth as needed for diarrhea or loose stools.      loratadine (CLARITIN) 10 MG tablet Take 10 mg by mouth daily as needed (allergies.).      LORazepam (ATIVAN) 0.5 MG tablet Take 1 tablet (0.5 mg total) by mouth 2 (two) times daily as needed for anxiety. 60 tablet 1   nirmatrelvir/ritonavir EUA (PAXLOVID) TABS Take 3 tablets by mouth 2 (two) times daily for 5 days. Patient GFR is 74 Take nirmatrelvir (150 mg) two tablets twice daily for 5 days and ritonavir (100 mg) one tablet twice daily for 5 days. 30 tablet 0   ONETOUCH ULTRA test strip USE TO CHECK BLOOD SUGARS TWO TIMES DAILY 100 strip 3   OVER THE COUNTER MEDICATION Apply 1 application topically daily as needed (pain). Triderma otc pain cream     prochlorperazine (COMPAZINE) 10 MG tablet Take 10 mg by mouth every 6 (six) hours as needed for nausea or vomiting.     solifenacin (VESICARE) 5 MG tablet TAKE 1 TABLET (5 MG TOTAL) BY MOUTH DAILY. (Patient taking differently: Take 5 mg by mouth daily as needed (bladder pain/spasms.).) 90 tablet 3   traMADol (ULTRAM) 50 MG tablet Take 1 tablet (50 mg total) by mouth 2 (two) times daily as needed for moderate pain (back pain). 10 tablet 0   vitamin B-12 (CYANOCOBALAMIN) 1000 MCG tablet Take 1,000 mcg by mouth daily.      Social History   Socioeconomic History   Marital status: Divorced    Spouse name: Not on file   Number of children: 4   Years of education: 16   Highest education level: Not on file  Occupational History   Occupation: retired Therapist, sports  Tobacco Use   Smoking status: Never   Smokeless tobacco: Never  Vaping Use   Vaping Use: Never used  Substance and Sexual Activity   Alcohol use: No    Alcohol/week: 0.0 standard drinks   Drug use: No   Sexual activity: Not Currently  Other Topics Concern   Not on file  Social History Narrative   Not on file   Social Determinants of Health   Financial  Resource Strain: Low Risk    Difficulty of Paying Living Expenses: Not hard at all  Food Insecurity: No Food Insecurity   Worried About Charity fundraiser in the Last Year: Never true   Ran Out of Food in the Last Year: Never true  Transportation Needs: No Transportation Needs   Lack of Transportation (Medical): No   Lack of Transportation (Non-Medical): No  Physical Activity: Insufficiently Active   Days of Exercise per Week: 2 days   Minutes of Exercise per Session: 60 min  Stress: No Stress Concern Present   Feeling of Stress : Not at all  Social Connections: Moderately Integrated   Frequency of Communication with Friends and Family: More than three times a week   Frequency of Social Gatherings with Friends and Family: Once a week   Attends Religious Services: 1 to 4 times per year   Active Member of [employer] or Organizations: No   Attends Music therapist: 1 to 4 times per year   Marital Status: Divorced  Human resources officer Violence: Not At Risk   Fear of Current or Ex-Partner: No   Emotionally Abused: No   Physically Abused: No   Sexually Abused: No   Family History  Problem Relation Age of Onset   Hypertension Other    Diabetes Father    Hypertension Father    Diabetes Sister    Hypertension Mother    Hypertension Sister    Stroke Sister    Diabetes Maternal Aunt    Thyroid cancer Daughter 60   Colon cancer Neg Hx    Esophageal cancer Neg Hx    Stomach cancer Neg Hx    Rectal cancer Neg Hx    Breast cancer Neg Hx    Endometrial cancer Neg Hx    Ovarian cancer Neg Hx      OBJECTIVE:  Vitals:   07/14/20 1512 07/14/20 1513  BP: (!) 142/71   Pulse: 77   Resp: 20   Temp: 99 F (37.2 C)   SpO2: 99% 99%     General appearance: alert; appears fatigued, but nontoxic; speaking in full sentences and tolerating own secretions HEENT: NCAT; Ears: EACs clear, TMs pearly gray; Eyes: PERRL.  EOM grossly intact. Sinuses: nontender; Nose: nares patent with clear rhinorrhea, Throat: oropharynx erythematous, cobblestoning present, tonsils non erythematous or enlarged, uvula midline  Neck: supple without LAD Lungs: unlabored respirations, symmetrical air entry; cough: absent; no respiratory distress; mild wheezing to bilateral lower lobes Heart: regular rate and rhythm.  Radial pulses 2+ symmetrical bilaterally Skin: warm and dry Psychological: alert and cooperative; normal mood and affect  LABS:  No results found. However, due to the size of the patient record, not all encounters were searched. Please check Results Review for a complete set of results.   ASSESSMENT & PLAN:  1. COVID-19   2. Post-viral cough syndrome     Meds ordered this encounter  Medications   albuterol (VENTOLIN HFA) 108 (90 Base) MCG/ACT inhaler 2 puff   Albuterol inhaler given in office today Continue supportive care at home Get plenty of rest and push fluids Use OTC zyrtec for nasal congestion, runny nose, and/or sore throat Use OTC flonase for nasal congestion and runny nose Use medications daily for symptom relief Use OTC medications like ibuprofen or tylenol as needed fever or pain Call or go to the ED if you have any new or worsening symptoms such as fever, worsening cough, shortness of breath, chest tightness, chest pain, turning blue,  changes in mental status.  Reviewed expectations re: course of current medical issues. Questions answered. Outlined signs and symptoms indicating need for more acute intervention. Patient verbalized understanding. After Visit Summary  given.          Faustino Congress, NP 07/14/20 1621

## 2020-07-14 NOTE — Discharge Instructions (Addendum)
Albuterol inhaler given for you to use 2 puffs every 4-6 hours as needed for cough, shortness of breath, wheezing.  Follow up with this office or with primary care if symptoms are persisting.  Follow up in the ER for high fever, trouble swallowing, trouble breathing, other concerning symptoms.

## 2020-07-14 NOTE — ED Triage Notes (Signed)
PT tested positive for COVID one week ago. Pt reports she was wheezing last night and that scarred her. Pt her to be checked out.

## 2020-07-15 ENCOUNTER — Encounter: Payer: Self-pay | Admitting: Internal Medicine

## 2020-07-15 DIAGNOSIS — U071 COVID-19: Secondary | ICD-10-CM | POA: Insufficient documentation

## 2020-07-15 NOTE — Assessment & Plan Note (Signed)
See notes

## 2020-07-15 NOTE — Patient Instructions (Signed)
Please take all new medication as prescribed 

## 2020-07-17 ENCOUNTER — Telehealth: Payer: Self-pay | Admitting: Internal Medicine

## 2020-07-17 NOTE — Telephone Encounter (Signed)
[facility] FYI 7.2.22:  ---Caller was diagnosed with COVID on June 25. She has spoken to her doctor, and was given an antiviral - Paxlovid on her 3rd day of medication. She continues to have cough and has starting to wheeze. Denies fever.  See PCP within 4 hours - pt went to urgent care instead

## 2020-07-20 ENCOUNTER — Other Ambulatory Visit (HOSPITAL_COMMUNITY): Payer: Self-pay

## 2020-07-23 ENCOUNTER — Other Ambulatory Visit (HOSPITAL_COMMUNITY): Payer: Self-pay

## 2020-07-23 MED FILL — Solifenacin Succinate Tab 5 MG: ORAL | 90 days supply | Qty: 90 | Fill #1 | Status: CN

## 2020-07-25 ENCOUNTER — Other Ambulatory Visit: Payer: Self-pay

## 2020-07-25 ENCOUNTER — Inpatient Hospital Stay: Payer: Medicare Other

## 2020-07-25 ENCOUNTER — Encounter: Payer: Self-pay | Admitting: Gynecologic Oncology

## 2020-07-25 ENCOUNTER — Inpatient Hospital Stay: Payer: Medicare Other | Attending: Gynecologic Oncology | Admitting: Gynecologic Oncology

## 2020-07-25 VITALS — BP 155/77 | HR 88 | Temp 98.2°F | Resp 16 | Ht 64.0 in | Wt 192.8 lb

## 2020-07-25 DIAGNOSIS — R971 Elevated cancer antigen 125 [CA 125]: Secondary | ICD-10-CM | POA: Insufficient documentation

## 2020-07-25 DIAGNOSIS — Z8616 Personal history of COVID-19: Secondary | ICD-10-CM | POA: Diagnosis not present

## 2020-07-25 DIAGNOSIS — Z7984 Long term (current) use of oral hypoglycemic drugs: Secondary | ICD-10-CM | POA: Insufficient documentation

## 2020-07-25 DIAGNOSIS — R011 Cardiac murmur, unspecified: Secondary | ICD-10-CM | POA: Diagnosis not present

## 2020-07-25 DIAGNOSIS — C569 Malignant neoplasm of unspecified ovary: Secondary | ICD-10-CM

## 2020-07-25 DIAGNOSIS — E785 Hyperlipidemia, unspecified: Secondary | ICD-10-CM | POA: Insufficient documentation

## 2020-07-25 DIAGNOSIS — Z79899 Other long term (current) drug therapy: Secondary | ICD-10-CM | POA: Insufficient documentation

## 2020-07-25 DIAGNOSIS — F419 Anxiety disorder, unspecified: Secondary | ICD-10-CM | POA: Insufficient documentation

## 2020-07-25 DIAGNOSIS — M5090 Cervical disc disorder, unspecified, unspecified cervical region: Secondary | ICD-10-CM | POA: Insufficient documentation

## 2020-07-25 DIAGNOSIS — Z90710 Acquired absence of both cervix and uterus: Secondary | ICD-10-CM | POA: Diagnosis not present

## 2020-07-25 DIAGNOSIS — I10 Essential (primary) hypertension: Secondary | ICD-10-CM | POA: Diagnosis not present

## 2020-07-25 DIAGNOSIS — Z90722 Acquired absence of ovaries, bilateral: Secondary | ICD-10-CM | POA: Diagnosis not present

## 2020-07-25 NOTE — Patient Instructions (Signed)
Dr Denman George is checking the CA 125 today to see if it is continuing to rise. She will then follow-up with the CT scan in August and your appointment with Dr Raliegh Ip in August. At that time he will decide if the cancer has regrown enough to require resuming chemotherapy.

## 2020-07-25 NOTE — Progress Notes (Signed)
Follow-up Note: Gyn-Onc  Consult was requested by Dr. Delton Coombes for the evaluation of Laura [name] 75 y.o. female  CC:  Chief Complaint  Patient presents with   Primary ovarian adenocarcinoma, unspecified laterality [facility])    Assessment/Plan:  Laura [name]  is a 75 y.o.  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 This was treated with neoadjuvant chemotherapy, surgery in December, 2021 showing complete pathologic response. Carboplatin and paclitaxel x 6 completed on 11/17/19.   Elevation in CA 125 concerning for recurrence.  CT suggests location of recurrence is the para-aortic region.  Repeat CA 125 today. Repeat CT as scheduled next month.  Will discuss her case at multi-disciplinary tumor board: options include:  - sending tumor for next gen sequencing and molecularly targeted therapy - retreatment with platinum doublet or single agent platinum (she had significant toxicities to front line therapy) - consideration of radiation to aortic nodal region, given oligometastatic natureof her disease.  - continued observation with 3 monthly tumor marker assessments and initiation of salvage therapy (one of the above options) if disease becomes more symptomatic.  I do not feel that secondary surgical debulking is a good option given that she relapsed in the same region as was resected previously (and that specimen was negative for viable carcinoma).   HPI: Laura [name] is a 75 year old P3 who was seen in consultation at the request of Dr Delton Coombes for evaluation of stage IIIC high grade serous carcinoma of presumed ovary or fallopian tube.   The patient's history began in March, 2021 when she began experiencing flank and abdominal pains. These were evaluated at [facility] with a CT abd/pelvis which showed mixed solid and cystic lesions within the retroperitoneum centered predominantly in  the aortocaval interval and tracking into the right iliac chain.  The largest solid component appeared to measure 5.9 x 4.3 x 6.4 cm.  To the left of the aorta was an additional lesion measuring approximately 4 x 3.6 x 4.6 cm.  The lesions partially encased to the abdominal aorta as well as the IMA origin, proximal right renal artery, and right common iliac.  There was compression of the IVC with loss of clearly discernible fat plane and a small segment concerning for possible intraluminal extension.  The uterus was surgically absent and there were no concerning adnexal masses seen.  No obvious distant metastatic disease was identified.  There was severe right hydroureter ureter nephrosis to the level of the mid ureter.  This was likely secondary to occlusion by the retroperitoneal node masses.  There was moderate left hydronephrosis possibly secondary to a distended urinary bladder versus compression from the left nodal mass.  She subsequently was seen and evaluated by oncologist, Dr Delton Coombes, who performed a PET scan on May 17, 2019.  This revealed the previously demonstrated retroperitoneal masses which were hypermetabolic consistent with malignancy.  There is probable associated partial obstruction of the right mid ureter.  There was no hypermetabolic activity within the neck or chest.  There was no hypermetabolic areas within the pelvis.  An IR guided lymph node biopsy was performed on Jun 03, 2019 and revealed adenocarcinoma with some antibodies.  The carcinoma had high-grade features with histology consistent with a possible serous carcinoma.  Immunostains were positive for cytokeratin 7, p53, PAX 8, and weak focal positivity for ER.  This immunostain profile was consistent with a gynecologic primary.  The patient  was initially disinterested in chemotherapy and desired surgical resection, not understanding that both, and certainly chemotherapy, would be necessary for curative intent treatment. After  counseling, she agreed to neoadjuvant chemotherapy after understanding that complete surgical resection for cure would not be possible given the extensive distribution of her metastatic nodal disease.  She went on to receive 6 cycles of neoadjuvant carboplatin paclitaxel chemotherapy.  She struggled somewhat with neoadjuvant chemotherapy requiring dose modifications.  After cycle 3 was administered she underwent a repeat CT scan of the abdomen and pelvis performed on October 04, 2019.  This revealed that the retroperitoneal lymphadenopathy in the lower aortocaval space at the level of the bifurcations had decreased enhancement and a decrease in size measuring 5.2 x 2.9 cm (compared to 6 x 4.3 cm previously).  There was low-attenuation lymphadenopathy in the left para-aortic region with mild increase in size and index node measuring 5 x 4.4 cm which had slightly increased from 4.5 to 3.7 cm previously.  There were no new sites of lymphadenopathy identified.  Ca 125 was drawn on October 06, 2019 which was day 1 of cycle 4.  This had normalized to 21.3 (prior to treatment in June 2021 this value was 346).  She continued chemotherapy because her nodal disease was not felt to be amenable to complete resection after 3 cycles, receiving her 6th cycle on November 17, 2019.  The patient reported that the cycle was particularly difficult to tolerate.  She had substantial fatigue and weakness following the cycle of chemotherapy.  On November 17, 2019 which was day 1 of cycle 6 her Ca1 25 was normal at 15.2.  (It had normalized in September, 2021).  PET restaging on December 06, 2019 showed improving periotic lymphadenopathy with the right para-aortic nodal conglomerate now measuring 2.4 x 4.8 cm in aggregate (it had previously been 2.6 x 5).  There was a very small region of FDG avidity within that area but mostly necrosis.  The area on the left parotic region was 4.8 x 4.2 cm and favored a benign retroperitoneal  cyst versus lymphangioma as it had been completely stable throughout therapy with no FDG uptake.  On 01/03/2020 she underwent robotic assisted BSO, para-aortic node debulking. Intraoperative findings were significant for a bulky cystic necrotic nodal complex overlying the aortocavum at the sacral promontory and infra-mesenteric aorta. There was a cystic left para-aortic structure that was filled with amber fluid and after drainage of the cyst there was no structure to resect. The entire right/central aortocaval nodal mass was resected with no gross residual disease remaining. There was a normal appearing left ovary, a right ovary that was mildly nodular and adherent to the right ovarian fossa (both removed). She had a surgical absent uterus. There was a normal appearing omentum and no gross peritoneal disease. At the completion of the surgery there was no gross visible residual tumor remaining. Surgery was uncomplicated.  Final pathology revealed a high grade serous carcinoma of the right ovary (comprising <54m focus). The left tube and ovary had no residual carcinoma. There was no carcinoma identified in the lymph node excision.  Interval Hx:  She did not receive additional chemotherapy postoperatively given the complete pathologic response noted from surgical specimens and given the toxicity associated with her neoadjuvant chemotherapy. CA 125 on 02/09/20 was 17.8.  Surveillance CA 125 on 04/09/20 was 30.1.  Repeat CT abd/pelvis was ordered to evaluate this elevation in CA 125 and showed a significant further reduction in retroperitoneal adenopathy compared to PET-CT  of 12/05/2019. No new metastatic lesions identified.  Her CA 125 increased again on 05/30/20 to 59. Another CT scan was performed on 05/31/20 and showed that the previously noted retroperitoneal lymphadenopathy was very similar to the prior examination, with slight regression of a lymph node adjacent to the left renal hilum, but probable  slight enlargement of a lymph node adjacent to the aortic bifurcation. A lesion in the colon was suggested (incidentally).  She was evaluated by GI who felt that her CT findings could be attributed to underfilling but not a luminal lesion.   She was diagnosed with COVID on June 25th, 2022 and presented with with symptoms of abdominal pains, and therefore another CT was done as part of this diagnostic work-up. It showed slight interval improvement of left periaortic adenopathy with stable appearance of intra-aortocaval lymphadenopathy compared to the previous imaging.      Current Meds:  Outpatient Encounter Medications as of 07/25/2020  Medication Sig   acetaminophen (TYLENOL) 500 MG tablet Take 1 tablet (500 mg total) by mouth every 6 (six) hours as needed.   amLODipine (NORVASC) 5 MG tablet TAKE 1 TABLET (5 MG TOTAL) BY MOUTH DAILY.   Blood Glucose Monitoring Suppl (ONE TOUCH ULTRA 2) w/Device KIT Use as directed   Camphor-Eucalyptus-Menthol (VICKS VAPORUB EX) Apply 1 application topically.   Cholecalciferol (VITAMIN D3) 125 MCG (5000 UT) TABS Take 10,000-15,000 Units by mouth daily.   eszopiclone (LUNESTA) 2 MG TABS tablet Take 1 tablet (2 mg total) by mouth at bedtime as needed for sleep. Take immediately before bedtime   famotidine (PEPCID) 20 MG tablet Take 20 mg by mouth 2 (two) times daily as needed for heartburn or indigestion.   feeding supplement (BOOST HIGH PROTEIN) LIQD Take 237 mLs by mouth daily.   glimepiride (AMARYL) 2 MG tablet TAKE ONE TABLET BY MOUTH DAILY (Patient taking differently: Take 2 mg by mouth daily with breakfast.)   irbesartan (AVAPRO) 75 MG tablet Take 1 tablet (75 mg total) by mouth daily.   Lancets (ONETOUCH ULTRASOFT) lancets USE TO CHECK BLOOD SUGAR TWICE DAILY   loperamide (IMODIUM) 2 MG capsule Take 2 mg by mouth as needed for diarrhea or loose stools.    loratadine (CLARITIN) 10 MG tablet Take 10 mg by mouth daily as needed (allergies.).    LORazepam  (ATIVAN) 0.5 MG tablet Take 1 tablet (0.5 mg total) by mouth 2 (two) times daily as needed for anxiety.   ONETOUCH ULTRA test strip USE TO CHECK BLOOD SUGARS TWO TIMES DAILY   OVER THE COUNTER MEDICATION Apply 1 application topically daily as needed (pain). Triderma otc pain cream   prochlorperazine (COMPAZINE) 10 MG tablet Take 10 mg by mouth every 6 (six) hours as needed for nausea or vomiting.   solifenacin (VESICARE) 5 MG tablet TAKE 1 TABLET (5 MG TOTAL) BY MOUTH DAILY. (Patient taking differently: Take 5 mg by mouth daily as needed (bladder pain/spasms.).)   traMADol (ULTRAM) 50 MG tablet Take 1 tablet (50 mg total) by mouth 2 (two) times daily as needed for moderate pain (back pain).   vitamin B-12 (CYANOCOBALAMIN) 1000 MCG tablet Take 1,000 mcg by mouth daily.    No facility-administered encounter medications on file as of 07/25/2020.    Allergy:  Allergies  Allergen Reactions   Ciprofloxacin Swelling    Torn tendon   Hydrocodone Bit-Homatrop Mbr Other (See Comments)    Vertigo *pt strongly prefers to never take*   Sulfa Antibiotics Hives, Itching and Swelling  Tongue swells   Augmentin [Amoxicillin-Pot Clavulanate]     Diarrhea; can take PCN/ Amoxicillin   Codeine Itching   Crestor [Rosuvastatin Calcium] Other (See Comments)    Did something to memory     Doxycycline Other (See Comments)    Severe rectal Gas.   Gabapentin     disoriented   Keflex [Cephalexin] Diarrhea and Nausea And Vomiting   Naproxen Other (See Comments)    Stomach cramps   Statins     Muscle weakness   Prednisone Anxiety    *pt strongly prefers to never be given prednisone*     Social Hx:   Social History   Socioeconomic History   Marital status: Divorced    Spouse name: Not on file   Number of children: 4   Years of education: 16   Highest education level: Not on file  Occupational History   Occupation: retired Geographical information systems officer  Tobacco Use   Smoking status: Never   Smokeless  tobacco: Never  Vaping Use   Vaping Use: Never used  Substance and Sexual Activity   Alcohol use: No    Alcohol/week: 0.0 standard drinks   Drug use: No   Sexual activity: Not Currently  Other Topics Concern   Not on file  Social History Narrative   Not on file   Social Determinants of Health   Financial Resource Strain: Low Risk    Difficulty of Paying Living Expenses: Not hard at all  Food Insecurity: No Food Insecurity   Worried About Charity fundraiser in the Last Year: Never true   [place] in the Last Year: Never true  Transportation Needs: No Transportation Needs   Lack of Transportation (Medical): No   Lack of Transportation (Non-Medical): No  Physical Activity: Insufficiently Active   Days of Exercise per Week: 2 days   Minutes of Exercise per Session: 60 min  Stress: No Stress Concern Present   Feeling of Stress : Not at all  Social Connections: Moderately Integrated   Frequency of Communication with Friends and Family: More than three times a week   Frequency of Social Gatherings with Friends and Family: Once a week   Attends Religious Services: 1 to 4 times per year   Active Member of [employer] or Organizations: No   Attends Music therapist: 1 to 4 times per year   Marital Status: Divorced  Human resources officer Violence: Not At Risk   Fear of Current or Ex-Partner: No   Emotionally Abused: No   Physically Abused: No   Sexually Abused: No    Past Surgical Hx:  Past Surgical History:  Procedure Laterality Date   ABDOMINAL HYSTERECTOMY     vaginal   BLADDER SURGERY     BREAST EXCISIONAL BIOPSY Bilateral    BREAST SURGERY     CHOLECYSTECTOMY     COLONOSCOPY     fibroids removed     breast (both breasts)   IR IMAGING GUIDED PORT INSERTION  07/26/2019   right   MASS EXCISION Right 06/26/2016   Procedure: EXCISION MUCOID TUMOR RIGHT THUMB, IP RIGHT THUMB;  Surgeon: Daryll Brod, MD;  Location: [place];  Service: Orthopedics;   Laterality: Right;   PARTIAL HYSTERECTOMY     ROBOTIC ASSISTED BILATERAL SALPINGO OOPHERECTOMY N/A 01/03/2020   Procedure: XI ROBOTIC ASSISTED BILATERAL SALPINGO OOPHORECTOMY, RADICAL TUMOR DEBULKING;  Surgeon: Everitt Amber, MD;  Location: [facility];  Service: Gynecology;  Laterality: N/A;   ROBOTIC PELVIC AND  PARA-AORTIC LYMPH NODE DISSECTION N/A 01/03/2020   Procedure: XI ROBOTIC PARA-AORTIC LYMPHADENECTOMY;  Surgeon: Everitt Amber, MD;  Location: [facility];  Service: Gynecology;  Laterality: N/A;    Past Medical Hx:  Past Medical History:  Diagnosis Date   Anemia    Anxiety    Arthritis    back of neck, bones spurs on neck   Cancer (HCC)    ovarian cancer   Cataract    Cervical disc disease    Diabetes mellitus    Family history of thyroid cancer    Heart murmur    Hyperlipidemia    Hypertension    Mucoid cyst of joint    right thumb   Neuropathy    Port-A-Cath in place 07/21/2019   Reflux    Sleep apnea    wears CPAP nightly   Vertigo     Past Gynecological History:  See HPI No LMP recorded. Patient has had a hysterectomy.  Family Hx:  Family History  Problem Relation Age of Onset   Hypertension Other    Diabetes Father    Hypertension Father    Diabetes Sister    Hypertension Mother    Hypertension Sister    Stroke Sister    Diabetes Maternal Aunt    Thyroid cancer Daughter 60   Colon cancer Neg Hx    Esophageal cancer Neg Hx    Stomach cancer Neg Hx    Rectal cancer Neg Hx    Breast cancer Neg Hx    Endometrial cancer Neg Hx    Ovarian cancer Neg Hx     Review of Systems:  Constitutional  + fatigue and weakness ENT Normal appearing ears and nares bilaterally Skin/Breast  No rash, sores, jaundice, itching, dryness Cardiovascular  No chest pain, shortness of breath, or edema  Pulmonary  No cough or wheeze.  Gastro Intestinal  No nausea, vomitting, or diarrhoea. No bright red blood per rectum, no abdominal pain, change in bowel movement, or constipation.   Genito Urinary  No frequency, urgency, dysuria, denies hematuria Musculo Skeletal  No myalgia, arthralgia, joint swelling or pain  Neurologic  No weakness, numbness, change in gait,  Psychology  No depression, anxiety, insomnia.   Vitals:  Blood pressure (!) 155/77, pulse 88, temperature 98.2 F (36.8 C), temperature source Tympanic, resp. rate 16, height _0  (1.626 m), weight 192 lb 12.8 oz (87.5 kg), SpO2 100 %.  Physical Exam: WD in NAD Neck  Supple NROM, without any enlargements.  Lymph Node Survey No cervical supraclavicular or inguinal adenopathy Cardiovascular  Pulse normal rate, regularity and rhythm. S1 and S2 normal.  Lungs  Clear to auscultation bilateraly, without wheezes/crackles/rhonchi. Good air movement.  Skin  No rash/lesions/breakdown  Psychiatry  Alert and oriented to person, place, and time  Abdomen  Normoactive bowel sounds, abdomen soft, non-tender and obese without evidence of hernia. Incisions soft. Back No CVA tenderness Genito Urinary  No palpable pelvic masses Rectal  No palpable rectal masses Extremities  No bilateral cyanosis, clubbing or edema.  Thereasa Solo, MD  07/25/2020, 2:55 PM

## 2020-07-26 ENCOUNTER — Encounter: Payer: Self-pay | Admitting: Oncology

## 2020-07-26 LAB — CA 125: Cancer Antigen (CA) 125: 44.3 U/mL — ABNORMAL HIGH (ref 0.0–38.1)

## 2020-07-26 NOTE — Progress Notes (Signed)
Requested PD-L1 testing on accession [id number] with [facility] via email.

## 2020-07-27 ENCOUNTER — Telehealth: Payer: Self-pay

## 2020-07-27 NOTE — Telephone Encounter (Signed)
Told Ms Prescher that the CA-125 marker was 44.3 which has decreased from 58.9 in May. Continue with planned CT and F/U appointment with Dr. Delton Coombes in August.  Pt verbalized understanding.

## 2020-08-06 ENCOUNTER — Other Ambulatory Visit: Payer: Self-pay | Admitting: Oncology

## 2020-08-06 NOTE — Progress Notes (Signed)
Gynecologic Oncology Multi-Disciplinary Disposition Conference Note  Date of the Conference: 08/06/2020  Patient Name: Laura [name]  Primary GYN Oncologist: Dr. Denman George  Stage/Disposition:  Stage IIIC metastatic gynecologic high grade serous carcinoma of the ovary.  Disposition is to continued surveillance.  Also consideration for radiation in the future to the aortic region.  PD-L1 has been requested..   This Multidisciplinary conference took place involving physicians from [place], Medical Oncology, Radiation Oncology, Pathology, Radiology along with the Gynecologic Oncology Nurse Practitioner and RN.  Comprehensive assessment of the patient's malignancy, staging, need for surgery, chemotherapy, radiation therapy, and need for further testing were reviewed. Supportive measures, both inpatient and following discharge were also discussed. The recommended plan of care is documented. Greater than 35 minutes were spent correlating and coordinating this patient's care.

## 2020-08-13 ENCOUNTER — Other Ambulatory Visit (HOSPITAL_COMMUNITY): Payer: Self-pay | Admitting: Internal Medicine

## 2020-08-13 MED FILL — Amlodipine Besylate Tab 5 MG (Base Equivalent): ORAL | 90 days supply | Qty: 90 | Fill #1 | Status: CN

## 2020-08-13 NOTE — Telephone Encounter (Signed)
Please refill as per office routine med refill policy (all routine meds refilled for 3 mo or monthly per pt preference up to one year from last visit, then month to month grace period for 3 mo, then further med refills will have to be denied)  

## 2020-08-14 ENCOUNTER — Telehealth (HOSPITAL_COMMUNITY): Payer: Self-pay | Admitting: Internal Medicine

## 2020-08-14 ENCOUNTER — Other Ambulatory Visit (HOSPITAL_COMMUNITY): Payer: Self-pay

## 2020-08-14 MED FILL — Amlodipine Besylate Tab 5 MG (Base Equivalent): ORAL | 90 days supply | Qty: 90 | Fill #0 | Status: AC

## 2020-08-14 NOTE — Telephone Encounter (Signed)
Please refill as per office routine med refill policy (all routine meds refilled for 3 mo or monthly per pt preference up to one year from last visit, then month to month grace period for 3 mo, then further med refills will have to be denied)  

## 2020-08-15 ENCOUNTER — Telehealth: Payer: Self-pay | Admitting: Internal Medicine

## 2020-08-15 ENCOUNTER — Other Ambulatory Visit (HOSPITAL_BASED_OUTPATIENT_CLINIC_OR_DEPARTMENT_OTHER): Payer: Self-pay

## 2020-08-15 ENCOUNTER — Other Ambulatory Visit (HOSPITAL_COMMUNITY): Payer: Self-pay

## 2020-08-15 ENCOUNTER — Encounter (HOSPITAL_COMMUNITY): Payer: Self-pay | Admitting: Hematology

## 2020-08-15 MED ORDER — GLIMEPIRIDE 2 MG PO TABS
2.0000 mg | ORAL_TABLET | Freq: Every day | ORAL | 3 refills | Status: DC
  Filled 2020-08-15 – 2020-11-13 (×3): qty 90, 90d supply, fill #0
  Filled 2020-11-13 – 2021-02-07 (×2): qty 90, 90d supply, fill #1

## 2020-08-15 NOTE — Telephone Encounter (Signed)
Patient requesting refill for  amLODipine (NORVASC) 5 MG tablet  eszopiclone (LUNESTA) 2 MG TABS tablet  No medication remaining  [place] [id number] [place]

## 2020-08-15 NOTE — Telephone Encounter (Signed)
    Should patient be taking Glimepiride? Please explain reason for denial and contact patient

## 2020-08-15 NOTE — Telephone Encounter (Addendum)
I have not idea why this was not approved for refill or by whom  [facility] for refill

## 2020-08-15 NOTE — Addendum Note (Signed)
Addended by: Biagio Borg on: 08/15/2020 02:08 PM   Modules accepted: Orders

## 2020-08-16 ENCOUNTER — Encounter (HOSPITAL_COMMUNITY): Payer: Self-pay | Admitting: Hematology

## 2020-08-16 ENCOUNTER — Other Ambulatory Visit (HOSPITAL_COMMUNITY): Payer: Self-pay

## 2020-08-16 MED ORDER — AMLODIPINE BESYLATE 5 MG PO TABS: ORAL_TABLET | Freq: Every day | ORAL | 3 refills | Status: DC

## 2020-08-16 MED ORDER — ESZOPICLONE 2 MG PO TABS: 2.0000 mg | ORAL_TABLET | Freq: Every evening | ORAL | 1 refills | Status: DC | PRN

## 2020-08-16 NOTE — Addendum Note (Signed)
Addended by: Biagio Borg on: 08/16/2020 05:08 PM   Modules accepted: Orders

## 2020-08-16 NOTE — Telephone Encounter (Signed)
Both done today erx

## 2020-08-23 ENCOUNTER — Other Ambulatory Visit (HOSPITAL_COMMUNITY): Payer: Self-pay

## 2020-08-24 ENCOUNTER — Ambulatory Visit (HOSPITAL_COMMUNITY)
Admission: RE | Admit: 2020-08-24 | Discharge: 2020-08-24 | Disposition: A | Discharge status: Home / Self Care | Payer: Medicare Other | Source: Ambulatory Visit | Attending: Hematology | Admitting: Hematology

## 2020-08-24 ENCOUNTER — Inpatient Hospital Stay (HOSPITAL_COMMUNITY): Payer: Medicare Other | Attending: Hematology

## 2020-08-24 ENCOUNTER — Other Ambulatory Visit: Payer: Self-pay

## 2020-08-24 DIAGNOSIS — E119 Type 2 diabetes mellitus without complications: Secondary | ICD-10-CM | POA: Diagnosis not present

## 2020-08-24 DIAGNOSIS — N1330 Unspecified hydronephrosis: Secondary | ICD-10-CM | POA: Diagnosis not present

## 2020-08-24 DIAGNOSIS — K449 Diaphragmatic hernia without obstruction or gangrene: Secondary | ICD-10-CM | POA: Insufficient documentation

## 2020-08-24 DIAGNOSIS — C569 Malignant neoplasm of unspecified ovary: Secondary | ICD-10-CM | POA: Diagnosis not present

## 2020-08-24 DIAGNOSIS — R5383 Other fatigue: Secondary | ICD-10-CM | POA: Insufficient documentation

## 2020-08-24 DIAGNOSIS — R1031 Right lower quadrant pain: Secondary | ICD-10-CM | POA: Diagnosis not present

## 2020-08-24 DIAGNOSIS — N3281 Overactive bladder: Secondary | ICD-10-CM | POA: Insufficient documentation

## 2020-08-24 DIAGNOSIS — R200 Anesthesia of skin: Secondary | ICD-10-CM | POA: Insufficient documentation

## 2020-08-24 DIAGNOSIS — G629 Polyneuropathy, unspecified: Secondary | ICD-10-CM | POA: Diagnosis not present

## 2020-08-24 DIAGNOSIS — M25551 Pain in right hip: Secondary | ICD-10-CM | POA: Diagnosis not present

## 2020-08-24 DIAGNOSIS — R232 Flushing: Secondary | ICD-10-CM | POA: Insufficient documentation

## 2020-08-24 DIAGNOSIS — C563 Malignant neoplasm of bilateral ovaries: Secondary | ICD-10-CM | POA: Diagnosis not present

## 2020-08-24 DIAGNOSIS — Z833 Family history of diabetes mellitus: Secondary | ICD-10-CM | POA: Insufficient documentation

## 2020-08-24 DIAGNOSIS — I700 Atherosclerosis of aorta: Secondary | ICD-10-CM | POA: Insufficient documentation

## 2020-08-24 DIAGNOSIS — G479 Sleep disorder, unspecified: Secondary | ICD-10-CM | POA: Diagnosis not present

## 2020-08-24 DIAGNOSIS — I10 Essential (primary) hypertension: Secondary | ICD-10-CM | POA: Diagnosis not present

## 2020-08-24 DIAGNOSIS — R590 Localized enlarged lymph nodes: Secondary | ICD-10-CM | POA: Insufficient documentation

## 2020-08-24 DIAGNOSIS — K5900 Constipation, unspecified: Secondary | ICD-10-CM | POA: Diagnosis not present

## 2020-08-24 DIAGNOSIS — N131 Hydronephrosis with ureteral stricture, not elsewhere classified: Secondary | ICD-10-CM | POA: Diagnosis not present

## 2020-08-24 DIAGNOSIS — Z823 Family history of stroke: Secondary | ICD-10-CM | POA: Insufficient documentation

## 2020-08-24 DIAGNOSIS — Z79899 Other long term (current) drug therapy: Secondary | ICD-10-CM | POA: Insufficient documentation

## 2020-08-24 DIAGNOSIS — Z808 Family history of malignant neoplasm of other organs or systems: Secondary | ICD-10-CM | POA: Insufficient documentation

## 2020-08-24 HISTORY — DX: Unspecified hydronephrosis: N13.30

## 2020-08-24 LAB — CBC WITH DIFFERENTIAL/PLATELET
Abs Immature Granulocytes: 0.04 10*3/uL (ref 0.00–0.07)
Basophils Absolute: 0 10*3/uL (ref 0.0–0.1)
Basophils Relative: 1 %
Eosinophils Absolute: 0.1 10*3/uL (ref 0.0–0.5)
Eosinophils Relative: 1 %
HCT: 35.6 % — ABNORMAL LOW (ref 36.0–46.0)
Hemoglobin: 11.8 g/dL — ABNORMAL LOW (ref 12.0–15.0)
Immature Granulocytes: 1 %
Lymphocytes Relative: 25 %
Lymphs Abs: 2 10*3/uL (ref 0.7–4.0)
MCH: 30.5 pg (ref 26.0–34.0)
MCHC: 33.1 g/dL (ref 30.0–36.0)
MCV: 92 fL (ref 80.0–100.0)
Monocytes Absolute: 0.5 10*3/uL (ref 0.1–1.0)
Monocytes Relative: 6 %
Neutro Abs: 5.3 10*3/uL (ref 1.7–7.7)
Neutrophils Relative %: 66 %
Platelets: 202 10*3/uL (ref 150–400)
RBC: 3.87 MIL/uL (ref 3.87–5.11)
RDW: 13.9 % (ref 11.5–15.5)
WBC: 8 10*3/uL (ref 4.0–10.5)
nRBC: 0 % (ref 0.0–0.2)

## 2020-08-24 LAB — COMPREHENSIVE METABOLIC PANEL
ALT: 14 U/L (ref 0–44)
AST: 16 U/L (ref 15–41)
Albumin: 4.5 g/dL (ref 3.5–5.0)
Alkaline Phosphatase: 69 U/L (ref 38–126)
Anion gap: 5 (ref 5–15)
BUN: 19 mg/dL (ref 8–23)
CO2: 26 mmol/L (ref 22–32)
Calcium: 9.8 mg/dL (ref 8.9–10.3)
Chloride: 105 mmol/L (ref 98–111)
Creatinine, Ser: 0.76 mg/dL (ref 0.44–1.00)
GFR, Estimated: >60 mL/min (ref >60)
Glucose, Bld: 124 mg/dL — ABNORMAL HIGH (ref 70–99)
Potassium: 4.2 mmol/L (ref 3.5–5.1)
Sodium: 136 mmol/L (ref 135–145)
Total Bilirubin: 0.3 mg/dL (ref 0.3–1.2)
Total Protein: 7.7 g/dL (ref 6.5–8.1)

## 2020-08-24 MED ORDER — IOHEXOL 350 MG/ML SOLN
80.0000 mL | Freq: Once | INTRAVENOUS | Status: AC | PRN
  Administered 2020-08-24: 80 mL via INTRAVENOUS

## 2020-08-25 LAB — CA 125: Cancer Antigen (CA) 125: 31.4 U/mL (ref 0.0–38.1)

## 2020-08-27 ENCOUNTER — Ambulatory Visit: Payer: Medicare Other | Admitting: Internal Medicine

## 2020-08-29 ENCOUNTER — Other Ambulatory Visit (HOSPITAL_COMMUNITY): Payer: Self-pay

## 2020-08-29 NOTE — Progress Notes (Signed)
[place], [place] [address]   CLINIC:  Medical Oncology/Hematology  PCP:  Laura Borg, MD [place] / [place] [address] [phone]   REASON FOR VISIT:  Follow-up for ovarian cancer  PRIOR THERAPY: 1. Carboplatin, paclitaxel and Aloxi x 6 cycles from 07/28/2019 to 11/17/2019. 2. Robotic assisted BSO with tumor debulking on 01/03/2020.  NGS Results: Foundation 1 MS--stable, TMB 0 Muts/Mb  CURRENT THERAPY: surveillance  BRIEF ONCOLOGIC HISTORY:  Oncology History  Malignant neoplasm of both ovaries  06/20/2019 Initial Diagnosis   Primary ovarian adenocarcinoma, unspecified laterality ([facility])   07/01/2019 Genetic Testing   Foundation One     07/11/2019 Genetic Testing   Negative genetic testing:  No pathogenic variants detected on the [facility] Multi-Cancer Panel. The report date is 07/11/2019.  The Multi-Cancer Panel offered by [facility] includes sequencing and/or deletion duplication testing of the following 85 genes: AIP, ALK, APC, ATM, AXIN2,BAP1,  BARD1, BLM, BMPR1A, BRCA1, BRCA2, BRIP1, CASR, CDC73, CDH1, CDK4, CDKN1B, CDKN1C, CDKN2A (p14ARF), CDKN2A (p16INK4a), CEBPA, CHEK2, CTNNA1, DICER1, DIS3L2, EGFR (c.2369C>T, p.Thr790Met variant only), EPCAM (Deletion/duplication testing only), FH, FLCN, GATA2, GPC3, GREM1 (Promoter region deletion/duplication testing only), HOXB13 (c.251G>A, p.Gly84Glu), HRAS, KIT, MAX, MEN1, MET, MITF (c.952G>A, p.Glu318Lys variant only), MLH1, MSH2, MSH3, MSH6, MUTYH, NBN, NF1, NF2, NTHL1, PALB2, PDGFRA, PHOX2B, PMS2, POLD1, POLE, POT1, PRKAR1A, PTCH1, PTEN, RAD50, RAD51C, RAD51D, RB1, RECQL4, RET, RNF43, RUNX1, SDHAF2, SDHA (sequence changes only), SDHB, SDHC, SDHD, SMAD4, SMARCA4, SMARCB1, SMARCE1, STK11, SUFU, TERC, TERT, TMEM127, TP53, TSC1, TSC2, VHL, WRN and WT1.   07/28/2019 -  Chemotherapy   The patient had palonosetron (ALOXI) injection 0.25 mg, 0.25 mg, Intravenous,  Once, 6 of 7  cycles Administration: 0.25 mg (07/28/2019), 0.25 mg (08/18/2019), 0.25 mg (09/15/2019), 0.25 mg (10/06/2019), 0.25 mg (10/27/2019), 0.25 mg (11/17/2019) pegfilgrastim (NEULASTA ONPRO KIT) injection 6 mg, 6 mg, Subcutaneous, Once, 6 of 7 cycles Administration: 6 mg (07/28/2019), 6 mg (08/18/2019), 6 mg (09/15/2019), 6 mg (10/06/2019), 6 mg (10/27/2019), 6 mg (11/17/2019) CARBOplatin (PARAPLATIN) 470 mg in sodium chloride 0.9 % 250 mL chemo infusion, 470 mg (100 % of original dose 470 mg), Intravenous,  Once, 6 of 7 cycles Dose modification:   (original dose 470 mg, Cycle 1, Reason: Provider Judgment),   (original dose 568.2 mg, Cycle 2),   (original dose 460.5 mg, Cycle 3), 460 mg (original dose 460 mg, Cycle 4, Reason: Provider Judgment),   (original dose 473.5 mg, Cycle 5, Reason: Provider Judgment), 460 mg (original dose 460 mg, Cycle 6) Administration: 470 mg (07/28/2019), 570 mg (08/18/2019), 460 mg (09/15/2019), 460 mg (10/06/2019), 460 mg (10/27/2019), 460 mg (11/17/2019) fosaprepitant (EMEND) 150 mg in sodium chloride 0.9 % 145 mL IVPB, 150 mg, Intravenous,  Once, 6 of 7 cycles Administration: 150 mg (07/28/2019), 150 mg (08/18/2019), 150 mg (09/15/2019), 150 mg (10/06/2019), 150 mg (10/27/2019), 150 mg (11/17/2019) PACLitaxel (TAXOL) 282 mg in sodium chloride 0.9 % 250 mL chemo infusion (> 42m/m2), 140 mg/m2 = 282 mg (80 % of original dose 175 mg/m2), Intravenous,  Once, 6 of 7 cycles Dose modification: 140 mg/m2 (80 % of original dose 175 mg/m2, Cycle 1, Reason: Provider Judgment), 150 mg/m2 (original dose 175 mg/m2, Cycle 2, Reason: Provider Judgment), 140 mg/m2 (original dose 175 mg/m2, Cycle 3, Reason: Provider Judgment), 140 mg/m2 (original dose 175 mg/m2, Cycle 4, Reason: Other (see comments), Comment: fatigue) Administration: 282 mg (07/28/2019), 300 mg (08/18/2019), 282 mg (09/15/2019), 282 mg (10/06/2019), 282 mg (10/27/2019), 282 mg (  11/17/2019)   for chemotherapy treatment.       CANCER STAGING: Cancer  Staging No matching staging information was found for the patient.  INTERVAL HISTORY:  [name], a 75 y.o. female, returns for routine follow-up of her ovarian cancer. [name] was last seen on 06/21/20.   Today she reports feeling fair. She reports hot flashes and constipation for which she is taking colace. Her appetite is low, and she reports fatigue. She reports difficulty falling asleep for which she is  currently taking 1 Lunesta before bedtime. She is able to do all of her typical home activities unassisted. She reports pain in her right hip. She reports occasional neuropathy in her toes. She expressed a preference against radiation therapy. She also reports occasional low BP.   REVIEW OF SYSTEMS:  Review of Systems  Constitutional:  Positive for appetite change (75%) and fatigue (depleted).  Gastrointestinal:  Positive for abdominal pain (RLQ 4/10) and constipation.  Endocrine: Positive for hot flashes.  Genitourinary:  Positive for difficulty urinating and frequency.   Musculoskeletal:  Positive for arthralgias (R hip).  Neurological:  Positive for numbness (toes).  Psychiatric/Behavioral:  Positive for sleep disturbance.   All other systems reviewed and are negative.  PAST MEDICAL/SURGICAL HISTORY:  Past Medical History:  Diagnosis Date   Anemia    Anxiety    Arthritis    back of neck, bones spurs on neck   Cancer (HCC)    ovarian cancer   Cataract    Cervical disc disease    Diabetes mellitus    Family history of thyroid cancer    Heart murmur    Hyperlipidemia    Hypertension    Mucoid cyst of joint    right thumb   Neuropathy    Port-A-Cath in place 07/21/2019   Reflux    Sleep apnea    wears CPAP nightly   Vertigo    Past Surgical History:  Procedure Laterality Date   ABDOMINAL HYSTERECTOMY     vaginal   BLADDER SURGERY     BREAST EXCISIONAL BIOPSY Bilateral    BREAST SURGERY     CHOLECYSTECTOMY     COLONOSCOPY     fibroids removed     breast  (both breasts)   IR IMAGING GUIDED PORT INSERTION  07/26/2019   right   MASS EXCISION Right 06/26/2016   Procedure: EXCISION MUCOID TUMOR RIGHT THUMB, IP RIGHT THUMB;  Surgeon: Daryll Brod, MD;  Location: [facility];  Service: Orthopedics;  Laterality: Right;   PARTIAL HYSTERECTOMY     ROBOTIC ASSISTED BILATERAL SALPINGO OOPHERECTOMY N/A 01/03/2020   Procedure: XI ROBOTIC ASSISTED BILATERAL SALPINGO OOPHORECTOMY, RADICAL TUMOR DEBULKING;  Surgeon: Everitt Amber, MD;  Location: [facility];  Service: Gynecology;  Laterality: N/A;   ROBOTIC PELVIC AND PARA-AORTIC LYMPH NODE DISSECTION N/A 01/03/2020   Procedure: XI ROBOTIC PARA-AORTIC LYMPHADENECTOMY;  Surgeon: Everitt Amber, MD;  Location: [facility];  Service: Gynecology;  Laterality: N/A;    SOCIAL HISTORY:  Social History   Socioeconomic History   Marital status: Divorced    Spouse name: Not on file   Number of children: 4   Years of education: 16   Highest education level: Not on file  Occupational History   Occupation: retired Geographical information systems officer  Tobacco Use   Smoking status: Never   Smokeless tobacco: Never  Vaping Use   Vaping Use: Never used  Substance and Sexual Activity   Alcohol use: No    Alcohol/week: 0.0  standard drinks   Drug use: No   Sexual activity: Not Currently  Other Topics Concern   Not on file  Social History Narrative   Not on file   Social Determinants of Health   Financial Resource Strain: Low Risk    Difficulty of Paying Living Expenses: Not hard at all  Food Insecurity: No Food Insecurity   Worried About Charity fundraiser in the Last Year: Never true   [place] in the Last Year: Never true  Transportation Needs: No Transportation Needs   Lack of Transportation (Medical): No   Lack of Transportation (Non-Medical): No  Physical Activity: Insufficiently Active   Days of Exercise per Week: 2 days   Minutes of Exercise per Session: 60 min  Stress: No Stress Concern Present    Feeling of Stress : Not at all  Social Connections: Moderately Integrated   Frequency of Communication with Friends and Family: More than three times a week   Frequency of Social Gatherings with Friends and Family: Once a week   Attends Religious Services: 1 to 4 times per year   Active Member of [employer] or Organizations: No   Attends Music therapist: 1 to 4 times per year   Marital Status: Divorced  Human resources officer Violence: Not At Risk   Fear of Current or Ex-Partner: No   Emotionally Abused: No   Physically Abused: No   Sexually Abused: No    FAMILY HISTORY:  Family History  Problem Relation Age of Onset   Hypertension Other    Diabetes Father    Hypertension Father    Diabetes Sister    Hypertension Mother    Hypertension Sister    Stroke Sister    Diabetes Maternal Aunt    Thyroid cancer Daughter 50   Colon cancer Neg Hx    Esophageal cancer Neg Hx    Stomach cancer Neg Hx    Rectal cancer Neg Hx    Breast cancer Neg Hx    Endometrial cancer Neg Hx    Ovarian cancer Neg Hx     CURRENT MEDICATIONS:  Current Outpatient Medications  Medication Sig Dispense Refill   acetaminophen (TYLENOL) 500 MG tablet Take 1 tablet (500 mg total) by mouth every 6 (six) hours as needed. 30 tablet 0   amLODipine (NORVASC) 5 MG tablet TAKE 1 TABLET (5 MG TOTAL) BY MOUTH DAILY. 90 tablet 3   Blood Glucose Monitoring Suppl (ONE TOUCH ULTRA 2) w/Device KIT Use as directed 1 each 0   Camphor-Eucalyptus-Menthol (VICKS VAPORUB EX) Apply 1 application topically.     Cholecalciferol (VITAMIN D3) 125 MCG (5000 UT) TABS Take 10,000-15,000 Units by mouth daily.     eszopiclone (LUNESTA) 2 MG TABS tablet Take 1 tablet (2 mg total) by mouth at bedtime as needed for sleep. Take immediately before bedtime 90 tablet 1   famotidine (PEPCID) 20 MG tablet Take 20 mg by mouth 2 (two) times daily as needed for heartburn or indigestion.     feeding supplement (BOOST HIGH PROTEIN) LIQD Take 237  mLs by mouth daily.     glimepiride (AMARYL) 2 MG tablet Take 1 tablet (2 mg total) by mouth daily before breakfast. 90 tablet 3   irbesartan (AVAPRO) 75 MG tablet Take 1 tablet (75 mg total) by mouth daily. 90 tablet 3   Lancets (ONETOUCH ULTRASOFT) lancets USE TO CHECK BLOOD SUGAR TWICE DAILY 100 each 3   loperamide (IMODIUM) 2 MG capsule Take 2 mg by  mouth as needed for diarrhea or loose stools.      loratadine (CLARITIN) 10 MG tablet Take 10 mg by mouth daily as needed (allergies.).      LORazepam (ATIVAN) 0.5 MG tablet Take 1 tablet (0.5 mg total) by mouth 2 (two) times daily as needed for anxiety. 60 tablet 1   ONETOUCH ULTRA test strip USE TO CHECK BLOOD SUGARS TWO TIMES DAILY 100 strip 3   OVER THE COUNTER MEDICATION Apply 1 application topically daily as needed (pain). Triderma otc pain cream     prochlorperazine (COMPAZINE) 10 MG tablet Take 10 mg by mouth every 6 (six) hours as needed for nausea or vomiting.     solifenacin (VESICARE) 5 MG tablet TAKE 1 TABLET (5 MG TOTAL) BY MOUTH DAILY. (Patient taking differently: Take 5 mg by mouth daily as needed (bladder pain/spasms.).) 90 tablet 3   traMADol (ULTRAM) 50 MG tablet Take 1 tablet (50 mg total) by mouth 2 (two) times daily as needed for moderate pain (back pain). 10 tablet 0   vitamin B-12 (CYANOCOBALAMIN) 1000 MCG tablet Take 1,000 mcg by mouth daily.      No current facility-administered medications for this visit.    ALLERGIES:  Allergies  Allergen Reactions   Ciprofloxacin Swelling    Torn tendon   Hydrocodone Bit-Homatrop Mbr Other (See Comments)    Vertigo *pt strongly prefers to never take*   Sulfa Antibiotics Hives, Itching and Swelling    Tongue swells   Augmentin [Amoxicillin-Pot Clavulanate]     Diarrhea; can take PCN/ Amoxicillin   Codeine Itching   Crestor [Rosuvastatin Calcium] Other (See Comments)    Did something to memory     Doxycycline Other (See Comments)    Severe rectal Gas.   Gabapentin      disoriented   Keflex [Cephalexin] Diarrhea and Nausea And Vomiting   Naproxen Other (See Comments)    Stomach cramps   Statins     Muscle weakness   Prednisone Anxiety    *pt strongly prefers to never be given prednisone*     PHYSICAL EXAM:  Performance status (ECOG): 1 - Symptomatic but completely ambulatory  There were no vitals filed for this visit. Wt Readings from Last 3 Encounters:  07/25/20 192 lb 12.8 oz (87.5 kg)  07/07/20 192 lb (87.1 kg)  07/02/20 193 lb (87.5 kg)   Physical Exam Vitals reviewed.  Constitutional:      Appearance: Normal appearance.  Cardiovascular:     Rate and Rhythm: Normal rate and regular rhythm.     Pulses: Normal pulses.     Heart sounds: Normal heart sounds.  Pulmonary:     Effort: Pulmonary effort is normal.     Breath sounds: Normal breath sounds.  Neurological:     General: No focal deficit present.     Mental Status: She is alert and oriented to person, place, and time.  Psychiatric:        Mood and Affect: Mood normal.        Behavior: Behavior normal.     LABORATORY DATA:  I have reviewed the labs as listed.  CBC Latest Ref Rng & Units 08/24/2020 07/07/2020 06/14/2020  WBC 4.0 - 10.5 K/uL 8.0 6.4 6.5  Hemoglobin 12.0 - 15.0 g/dL 11.8(L) 11.5(L) 11.3(L)  Hematocrit 36.0 - 46.0 % 35.6(L) 33.8(L) 34.2(L)  Platelets 150 - 400 K/uL 202 165 198   CMP Latest Ref Rng & Units 08/24/2020 07/07/2020 06/14/2020  Glucose 70 - 99 mg/dL 124(H) 135(H)  194(H)  BUN 8 - 23 mg/dL 19 16 19   Creatinine 0.44 - 1.00 mg/dL 0.76 0.94 1.06(H)  Sodium 135 - 145 mmol/L 136 134(L) 138  Potassium 3.5 - 5.1 mmol/L 4.2 4.0 3.9  Chloride 98 - 111 mmol/L 105 101 106  CO2 22 - 32 mmol/L 26 23 24   Calcium 8.9 - 10.3 mg/dL 9.8 9.6 9.5  Total Protein 6.5 - 8.1 g/dL 7.7 7.1 7.0  Total Bilirubin 0.3 - 1.2 mg/dL 0.3 0.4 0.6  Alkaline Phos 38 - 126 U/L 69 59 61  AST 15 - 41 U/L 16 26 16   ALT 0 - 44 U/L 14 20 15     DIAGNOSTIC IMAGING:  I have independently  reviewed the scans and discussed with the patient. CT Abdomen Pelvis W Contrast  Result Date: 08/27/2020 CLINICAL DATA:  Follow-up ovarian carcinoma. Previous surgery and chemotherapy. EXAM: CT ABDOMEN AND PELVIS WITH CONTRAST TECHNIQUE: Multidetector CT imaging of the abdomen and pelvis was performed using the standard protocol following bolus administration of intravenous contrast. CONTRAST:  29m OMNIPAQUE IOHEXOL 350 MG/ML SOLN COMPARISON:  07/07/2020 FINDINGS: Lower Chest: No acute findings. Hepatobiliary: No hepatic masses identified. Prior cholecystectomy. No evidence of biliary obstruction. Pancreas:  No mass or inflammatory changes. Spleen: Within normal limits in size and appearance. Adrenals/Urinary Tract: No masses identified. Mild right hydronephrosis shows no significant change, secondary to retroperitoneal lymphadenopathy as described below. Stomach/Bowel: Small hiatal hernia again seen. No evidence of obstruction, inflammatory process or abnormal fluid collections. Vascular/Lymphatic: Retroperitoneal lymphadenopathy in the aortocaval space shows mild increase in size, currently measuring 5.0 x 3.0 cm on image 39/2, compared to 4.0 x 2.7 cm previously. This causes obstruction of the proximal right ureter. A low-attenuation lymph node in the left paraaortic space measures 16 mm on image 28/2, without significant change since previous study. No new sites of lymphadenopathy identified. Aortic atherosclerotic calcification noted. No acute vascular findings. Reproductive: Prior hysterectomy noted. Adnexal regions are unremarkable in appearance. Other: No evidence of peritoneal thickening, nodularity, or ascites. Musculoskeletal:  No suspicious bone lesions identified. IMPRESSION: Mild increase in size of retroperitoneal lymphadenopathy in the aortocaval space, causing obstruction of the proximal right ureter. Stable mild left paraaortic lymphadenopathy. No new sites of metastatic disease identified.  Stable mild right hydronephrosis secondary to retroperitoneal lymphadenopathy. Small hiatal hernia. Aortic Atherosclerosis (ICD10-I70.0). Electronically Signed   By: JMarlaine HindM.D.   On: 08/27/2020 08:50     ASSESSMENT:  1.  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  Mass measures 6 x 5.8 cm and is partially calcified.  Separate solid component superior to this in the left periaortic region measures 3.5 cm, SUV 14.3.  Cystic component medial to the lower pole of the left kidney is without hypermetabolic activity, possibly a lymphocele. -CT-guided biopsy of the right retroperitoneal lymph node consistent with adenocarcinoma with psammoma bodies.  Morphology and immunotherapy consistent with ovarian serous carcinoma/primary peritoneal carcinoma. -Germline mutation testing was negative. -Foundation 1 testing shows MS-stable.  Loss of heterozygosity was less than 16%. -CA-125 346 on 06/23/2019. - 6 cycles of carboplatin and paclitaxel from 07/28/2019 through 11/17/2019 -CTAP on 10/04/2019 after 3 cycles of chemotherapy showed retroperitoneal adenopathy at the bifurcation measuring 5.2 x 2.9 cm, left para-aortic lymphadenopathy measuring 5 x 4.4 cm, both of them decreased in size when compared to most recent PET scan.  CT scan report says that one of the lesion has  gotten bigger but this was compared to CT scan from 04/02/2019. -PET scan on December 05, 2019 shows 4.8 x 4.2 cm fluid filled density in the left para-aortic region, non-FDG avid favoring benign retroperitoneal cyst versus lymphangioma.  Right/anterior para-aortic mixed cystic/solid lesions measuring 2.4 x 4.8 cm, SUV 2.5, previous SUV 19.5. -Robotic assisted laparoscopic total hysterectomy and bilateral salpingo-oophorectomy by Dr. Denman George on 01/03/2020. -Pathology showed soft tissue deformities 2 sites and psammomatous  calcifications and chronic inflammation with no malignancy identified in the para-aortic lymph node.  Right salpingo-oophorectomy showed microscopic focus of residual adenocarcinoma less than 1 mm.  No malignancy in the left ovary.  [id number]. -As she had prior difficulty with chemotherapy, no further chemotherapy after surgery was recommended.   PLAN:  1.  Ovarian serous carcinoma: - She denies any abdominal pains.  Energy levels are improving. - Reviewed labs from April 2022.  CA125 has improved to 31. - Reviewed CTAP with contrast from 08/13/2020 which showed retroperitoneal lymphadenopathy in the aortocaval space shows mild increase in size measuring 5 x 3 cm compared to 4 x 2.7 cm previously.  Left para-aortic lymph node measures 16 mm, stable.  No new areas were seen. - She is currently asymptomatic from her disease recurrence. - We have discussed various options including close follow-up versus institution of single agent carboplatin.  We talked about radiation therapy also to the lymph node which has increased in size.  Patient is reluctant to consider radiation. - After discussing pros and cons of close observation versus chemotherapy, we have decided on close observation.  We will schedule her for 65-monthfollow-up with CT scan of the abdomen and pelvis and tumor marker.    2.  Low back pain: - She is not requiring any pain medication at this time.   3.  Constipation: - She is having constipation.  She reports having diarrhea if she takes Colace every day.  Again she had to take Imodium which worsens her constipation. - Recommend taking Metamucil daily and increase fiber intake with lots of fruits and vegetables.   4.  Hypertension: - Continue amlodipine and irbesartan.   5.  Difficulty sleeping: - Continue lorazepam as needed.   6.  Overactive bladder: - Symptoms are stable at this time.   Orders placed this encounter:  No orders of the defined types were placed in this  encounter.    SDerek Jack MD [id number]  I, KThana Ates am acting as a scribe for Dr. SDerek Jack  I, SDerek JackMD, have reviewed the above documentation for accuracy and completeness, and I agree with the above.

## 2020-08-30 ENCOUNTER — Other Ambulatory Visit: Payer: Self-pay

## 2020-08-30 ENCOUNTER — Inpatient Hospital Stay (HOSPITAL_BASED_OUTPATIENT_CLINIC_OR_DEPARTMENT_OTHER): Payer: Medicare Other | Admitting: Hematology

## 2020-08-30 ENCOUNTER — Encounter (HOSPITAL_COMMUNITY): Payer: Self-pay | Admitting: Hematology

## 2020-08-30 VITALS — BP 150/59 | HR 80 | Temp 98.2°F | Resp 18 | Wt 192.5 lb

## 2020-08-30 DIAGNOSIS — C569 Malignant neoplasm of unspecified ovary: Secondary | ICD-10-CM | POA: Diagnosis not present

## 2020-08-30 DIAGNOSIS — N3281 Overactive bladder: Secondary | ICD-10-CM | POA: Diagnosis not present

## 2020-08-30 DIAGNOSIS — R1031 Right lower quadrant pain: Secondary | ICD-10-CM | POA: Diagnosis not present

## 2020-08-30 DIAGNOSIS — R232 Flushing: Secondary | ICD-10-CM | POA: Diagnosis not present

## 2020-08-30 DIAGNOSIS — Z808 Family history of malignant neoplasm of other organs or systems: Secondary | ICD-10-CM | POA: Diagnosis not present

## 2020-08-30 DIAGNOSIS — E119 Type 2 diabetes mellitus without complications: Secondary | ICD-10-CM | POA: Diagnosis not present

## 2020-08-30 DIAGNOSIS — I700 Atherosclerosis of aorta: Secondary | ICD-10-CM | POA: Diagnosis not present

## 2020-08-30 DIAGNOSIS — Z79899 Other long term (current) drug therapy: Secondary | ICD-10-CM | POA: Diagnosis not present

## 2020-08-30 DIAGNOSIS — R200 Anesthesia of skin: Secondary | ICD-10-CM | POA: Diagnosis not present

## 2020-08-30 DIAGNOSIS — I10 Essential (primary) hypertension: Secondary | ICD-10-CM | POA: Diagnosis not present

## 2020-08-30 DIAGNOSIS — C563 Malignant neoplasm of bilateral ovaries: Secondary | ICD-10-CM | POA: Diagnosis not present

## 2020-08-30 DIAGNOSIS — N1330 Unspecified hydronephrosis: Secondary | ICD-10-CM | POA: Diagnosis not present

## 2020-08-30 DIAGNOSIS — R5383 Other fatigue: Secondary | ICD-10-CM | POA: Diagnosis not present

## 2020-08-30 DIAGNOSIS — G479 Sleep disorder, unspecified: Secondary | ICD-10-CM | POA: Diagnosis not present

## 2020-08-30 DIAGNOSIS — Z833 Family history of diabetes mellitus: Secondary | ICD-10-CM | POA: Diagnosis not present

## 2020-08-30 DIAGNOSIS — G629 Polyneuropathy, unspecified: Secondary | ICD-10-CM | POA: Diagnosis not present

## 2020-08-30 DIAGNOSIS — K5900 Constipation, unspecified: Secondary | ICD-10-CM | POA: Diagnosis not present

## 2020-08-30 DIAGNOSIS — M25551 Pain in right hip: Secondary | ICD-10-CM | POA: Diagnosis not present

## 2020-08-30 DIAGNOSIS — K449 Diaphragmatic hernia without obstruction or gangrene: Secondary | ICD-10-CM | POA: Diagnosis not present

## 2020-08-30 DIAGNOSIS — Z823 Family history of stroke: Secondary | ICD-10-CM | POA: Diagnosis not present

## 2020-08-30 NOTE — Patient Instructions (Addendum)
[place] at [facility] Discharge Instructions  You were seen today by Dr. Delton Coombes. He went over your recent results and scans. You will be scheduled for a CT scan of your abdomen and pelvis prior to your next visit. Dr. Delton Coombes will see you back in 3 months for labs and follow up.   Thank you for choosing [place] at [facility] to provide your oncology and hematology care.  To afford each patient quality time with our provider, please arrive at least 15 minutes before your scheduled appointment time.   If you have a lab appointment with the [facility] please come in thru the Main Entrance and check in at the main information desk  You need to re-schedule your appointment should you arrive 10 or more minutes late.  We strive to give you quality time with our providers, and arriving late affects you and other patients whose appointments are after yours.  Also, if you no show three or more times for appointments you may be dismissed from the clinic at the providers discretion.     Again, thank you for choosing [facility].  Our hope is that these requests will decrease the amount of time that you wait before being seen by our physicians.       _____________________________________________________________  Should you have questions after your visit to [facility], please contact our office at [phone] between the hours of 8:00 a.m. and 4:30 p.m.  Voicemails left after 4:00 p.m. will not be returned until the following business day.  For prescription refill requests, have your pharmacy contact our office and allow 72 hours.    Cancer Center Support Programs:   > Cancer Support Group  2nd Tuesday of the month 1pm-2pm, Journey Room

## 2020-09-02 ENCOUNTER — Encounter (HOSPITAL_COMMUNITY): Payer: Self-pay | Admitting: Hematology

## 2020-09-03 ENCOUNTER — Other Ambulatory Visit: Payer: Self-pay

## 2020-09-03 ENCOUNTER — Ambulatory Visit (INDEPENDENT_AMBULATORY_CARE_PROVIDER_SITE_OTHER): Payer: Medicare Other | Admitting: Internal Medicine

## 2020-09-03 ENCOUNTER — Encounter: Payer: Self-pay | Admitting: Internal Medicine

## 2020-09-03 VITALS — BP 156/78 | HR 83 | Temp 98.3°F | Ht 64.0 in | Wt 191.2 lb

## 2020-09-03 DIAGNOSIS — E119 Type 2 diabetes mellitus without complications: Secondary | ICD-10-CM

## 2020-09-03 DIAGNOSIS — K5900 Constipation, unspecified: Secondary | ICD-10-CM | POA: Diagnosis not present

## 2020-09-03 DIAGNOSIS — I700 Atherosclerosis of aorta: Secondary | ICD-10-CM

## 2020-09-03 DIAGNOSIS — F5101 Primary insomnia: Secondary | ICD-10-CM | POA: Diagnosis not present

## 2020-09-03 DIAGNOSIS — I10 Essential (primary) hypertension: Secondary | ICD-10-CM | POA: Diagnosis not present

## 2020-09-03 DIAGNOSIS — F418 Other specified anxiety disorders: Secondary | ICD-10-CM

## 2020-09-03 LAB — POCT GLYCOSYLATED HEMOGLOBIN (HGB A1C): Hemoglobin A1C: 6.3 % — AB (ref 4.0–5.6)

## 2020-09-03 MED ORDER — ESZOPICLONE 2 MG PO TABS: 2.0000 mg | ORAL_TABLET | Freq: Every evening | ORAL | 1 refills | Status: DC | PRN

## 2020-09-03 MED ORDER — LINACLOTIDE 72 MCG PO CAPS: 72.0000 ug | ORAL_CAPSULE | Freq: Every day | ORAL | 5 refills | Status: DC | PRN

## 2020-09-03 NOTE — Progress Notes (Signed)
Patient ID: Laura [name], female   DOB: [birth year], 75 y.o.   MRN: [id number]        Chief Complaint: follow up with generalized weakness, insomnia, anxiety,/depression, chronic constipation        HPI:  Laura [name] is a 75 y.o. female here with c/o weakness and fatigue post chemotherapy not really improving; Pt denies chest pain, increased sob or doe, wheezing, orthopnea, PND, increased LE swelling, palpitations, dizziness or syncope.   Pt denies polydipsia, polyuria, or new focal neuro s/s.   Pt denies fever, wt loss, night sweats, loss of appetite, or other constitutional symptoms  No new complaints      Denies worsening depressive symptoms, suicidal ideation, or panic.  Laura [name] does work well for sleep, though does have metallic taste and occasional weird dreams but very mild and willing to continue.  Denies worsening reflux, abd pain, dysphagia, n/v, or blood, but has worsening now chronic constipation sometimes no BM for over 5 days.     Wt Readings from Last 3 Encounters:  09/03/20 191 lb 3.2 oz (86.7 kg)  08/30/20 192 lb 8 oz (87.3 kg)  07/25/20 192 lb 12.8 oz (87.5 kg)   BP Readings from Last 3 Encounters:  09/03/20 (!) 156/78  08/30/20 (!) 150/59  07/25/20 (!) 155/77         Past Medical History:  Diagnosis Date   Anemia    Anxiety    Arthritis    back of neck, bones spurs on neck   Cancer (HCC)    ovarian cancer   Cataract    Cervical disc disease    Diabetes mellitus    Family history of thyroid cancer    Heart murmur    Hyperlipidemia    Hypertension    Mucoid cyst of joint    right thumb   Neuropathy    Port-A-Cath in place 07/21/2019   Reflux    Sleep apnea    wears CPAP nightly   Vertigo    Past Surgical History:  Procedure Laterality Date   ABDOMINAL HYSTERECTOMY     vaginal   BLADDER SURGERY     BREAST EXCISIONAL BIOPSY Bilateral    BREAST SURGERY     CHOLECYSTECTOMY     COLONOSCOPY     fibroids removed     breast (both breasts)   IR IMAGING  GUIDED PORT INSERTION  07/26/2019   right   MASS EXCISION Right 06/26/2016   Procedure: EXCISION MUCOID TUMOR RIGHT THUMB, IP RIGHT THUMB;  Surgeon: Daryll Brod, MD;  Location: [facility];  Service: Orthopedics;  Laterality: Right;   PARTIAL HYSTERECTOMY     ROBOTIC ASSISTED BILATERAL SALPINGO OOPHERECTOMY N/A 01/03/2020   Procedure: XI ROBOTIC ASSISTED BILATERAL SALPINGO OOPHORECTOMY, RADICAL TUMOR DEBULKING;  Surgeon: Everitt Amber, MD;  Location: [facility];  Service: Gynecology;  Laterality: N/A;   ROBOTIC PELVIC AND PARA-AORTIC LYMPH NODE DISSECTION N/A 01/03/2020   Procedure: XI ROBOTIC PARA-AORTIC LYMPHADENECTOMY;  Surgeon: Everitt Amber, MD;  Location: [facility];  Service: Gynecology;  Laterality: N/A;    reports that she has never smoked. She has never used smokeless tobacco. She reports that she does not drink alcohol and does not use drugs. family history includes Diabetes in her father, maternal aunt, and sister; Hypertension in her father, mother, sister, and another family member; Stroke in her sister; Thyroid cancer (age of onset: 5) in her daughter. Allergies  Allergen Reactions   Ciprofloxacin Swelling    Torn tendon  Hydrocodone Bit-Homatrop Mbr Other (See Comments)    Vertigo *pt strongly prefers to never take*   Sulfa Antibiotics Hives, Itching and Swelling    Tongue swells   Augmentin [Amoxicillin-Pot Clavulanate]     Diarrhea; can take PCN/ Amoxicillin   Codeine Itching   Crestor [Rosuvastatin Calcium] Other (See Comments)    Did something to memory     Doxycycline Other (See Comments)    Severe rectal Gas.   Gabapentin     disoriented   Keflex [Cephalexin] Diarrhea and Nausea And Vomiting   Naproxen Other (See Comments)    Stomach cramps   Statins     Muscle weakness   Prednisone Anxiety    *pt strongly prefers to never be given prednisone*    Current Outpatient Medications on File Prior to Visit  Medication Sig Dispense Refill   acetaminophen  (TYLENOL) 500 MG tablet Take 1 tablet (500 mg total) by mouth every 6 (six) hours as needed. 30 tablet 0   amLODipine (NORVASC) 5 MG tablet TAKE 1 TABLET (5 MG TOTAL) BY MOUTH DAILY. 90 tablet 3   Blood Glucose Monitoring Suppl (ONE TOUCH ULTRA 2) w/Device KIT Use as directed 1 each 0   Camphor-Eucalyptus-Menthol (VICKS VAPORUB EX) Apply 1 application topically.     Cholecalciferol (VITAMIN D3) 125 MCG (5000 UT) TABS Take 10,000-15,000 Units by mouth daily.     famotidine (PEPCID) 20 MG tablet Take 20 mg by mouth 2 (two) times daily as needed for heartburn or indigestion.     feeding supplement (BOOST HIGH PROTEIN) LIQD Take 237 mLs by mouth daily.     glimepiride (AMARYL) 2 MG tablet Take 1 tablet (2 mg total) by mouth daily before breakfast. 90 tablet 3   irbesartan (AVAPRO) 75 MG tablet Take 1 tablet (75 mg total) by mouth daily. 90 tablet 3   Lancets (ONETOUCH ULTRASOFT) lancets USE TO CHECK BLOOD SUGAR TWICE DAILY 100 each 3   loperamide (IMODIUM) 2 MG capsule Take 2 mg by mouth as needed for diarrhea or loose stools.      loratadine (CLARITIN) 10 MG tablet Take 10 mg by mouth daily as needed (allergies.).      LORazepam (ATIVAN) 0.5 MG tablet Take 1 tablet (0.5 mg total) by mouth 2 (two) times daily as needed for anxiety. 60 tablet 1   ONETOUCH ULTRA test strip USE TO CHECK BLOOD SUGARS TWO TIMES DAILY 100 strip 3   OVER THE COUNTER MEDICATION Apply 1 application topically daily as needed (pain). Triderma otc pain cream     prochlorperazine (COMPAZINE) 10 MG tablet Take 10 mg by mouth every 6 (six) hours as needed for nausea or vomiting.     psyllium (METAMUCIL) 58.6 % powder Take 1 packet by mouth in the morning and at bedtime.     solifenacin (VESICARE) 5 MG tablet TAKE 1 TABLET (5 MG TOTAL) BY MOUTH DAILY. (Patient taking differently: Take 5 mg by mouth daily as needed (bladder pain/spasms.).) 90 tablet 3   vitamin B-12 (CYANOCOBALAMIN) 1000 MCG tablet Take 1,000 mcg by mouth daily.       No current facility-administered medications on file prior to visit.        ROS:  All others reviewed and negative.  Objective        PE:  BP (!) 156/78 (BP Location: Left Arm, Patient Position: Sitting, Cuff Size: Normal)   Pulse 83   Temp 98.3 F (36.8 C) (Rectal)   Ht 5' 4"  (1.626 m)  Wt 191 lb 3.2 oz (86.7 kg)   SpO2 97%   BMI 32.82 kg/m                 Constitutional: Pt appears in NAD               HENT: Head: NCAT.                Right Ear: External ear normal.                 Left Ear: External ear normal.                Eyes: . Pupils are equal, round, and reactive to light. Conjunctivae and EOM are normal               Nose: without d/c or deformity               Neck: Neck supple. Gross normal ROM               Cardiovascular: Normal rate and regular rhythm.                 Pulmonary/Chest: Effort normal and breath sounds without rales or wheezing.                Abd:  Soft, NT, ND, + BS, no organomegaly               Neurological: Pt is alert. At baseline orientation, motor grossly intact               Skin: Skin is warm. No rashes, no other new lesions, LE edema - none               Psychiatric: Pt behavior is normal without agitation , mod nervous  Micro: none  Cardiac tracings I have personally interpreted today:  none  Pertinent Radiological findings (summarize): none   Lab Results  Component Value Date   WBC 8.0 08/24/2020   HGB 11.8 (L) 08/24/2020   HCT 35.6 (L) 08/24/2020   PLT 202 08/24/2020   GLUCOSE 124 (H) 08/24/2020   CHOL 270 (H) 02/28/2020   TRIG 286.0 (H) 02/28/2020   HDL 63.20 02/28/2020   LDLDIRECT 172.0 02/28/2020   LDLCALC 166 (H) 04/12/2014   ALT 14 08/24/2020   AST 16 08/24/2020   NA 136 08/24/2020   K 4.2 08/24/2020   CL 105 08/24/2020   CREATININE 0.76 08/24/2020   BUN 19 08/24/2020   CO2 26 08/24/2020   TSH 2.92 02/28/2020   INR 1.0 06/06/2019   HGBA1C 6.3 (A) 09/03/2020   MICROALBUR 1.4 02/28/2020   POCT HgB  A1C Order: [id number] Status: Final result   Visible to patient: No (scheduled for 09/03/2020  3:32 PM)   Dx: Diabetes mellitus type 2, noninsulin ...   0 Result Notes   1 HM Topic Component Ref Range & Units 14:32  (09/03/20) 6 mo ago  (02/28/20) 9 mo ago  (11/11/19) 1 yr ago  (04/04/19) 2 yr ago  (08/25/18) 2 yr ago  (03/09/18) 3 yr ago  (08/25/17)  Hemoglobin A1C 4.0 - 5.6 % 6.3 Abnormal   6.0 R, CM  6.5 R, CM  7.3 High  R, CM  7.3 High  R, CM  7.4 High  R, CM  7.4 High  R       Assessment/Plan:  Laura [name] is a 75 y.o. Black or African American [2] female with  has a  past medical history of Anemia, Anxiety, Arthritis, Cancer ([place]), Cataract, Cervical disc disease, Diabetes mellitus, Family history of thyroid cancer, Heart murmur, Hyperlipidemia, Hypertension, Mucoid cyst of joint, Neuropathy, Port-A-Cath in place (07/21/2019), Reflux, Sleep apnea, and Vertigo.  Aortic atherosclerosis (HCC) Pt to continue low chol diet, exercise, declines statin   Insomnia Stable overall, for contd lunesta prn qhs  Diabetes mellitus type 2, noninsulin dependent ([place]) Lab Results  Component Value Date   HGBA1C 6.3 (A) 09/03/2020   Stable, pt to continue current medical treatment amaryl   Depression with anxiety Stable overall, declines need for further med tx  Hypertension BP Readings from Last 3 Encounters:  09/03/20 (!) 156/78  08/30/20 (!) 150/59  07/25/20 (!) 155/77   Mild uncontrolled, declines further med tx for now given weakness, pt to continue medical treatment norvasc, avapro   Constipation With mild recent worsening, for linzess prn,  to f/u any worsening symptoms or concerns  Followup: Return in about 6 months (around 03/06/2021).  Cathlean Cower, MD 09/05/2020 10:38 PM [facility]

## 2020-09-03 NOTE — Patient Instructions (Addendum)
Your A1c was OK today  Please take all new medication as prescribed - the linzess for constipation as needed  Ok to continue the lunesta for now  Please continue all other medications as before, and refills have been done if requested.  Please have the pharmacy call with any other refills you may need.  Please continue your efforts at being more active, low cholesterol diet, and weight control.  Please keep your appointments with your specialists as you may have planned  Please make an Appointment to return in 6 months, or sooner if needed

## 2020-09-05 ENCOUNTER — Encounter: Payer: Self-pay | Admitting: Internal Medicine

## 2020-09-05 NOTE — Assessment & Plan Note (Signed)
Lab Results  Component Value Date   HGBA1C 6.3 (A) 09/03/2020   Stable, pt to continue current medical treatment amaryl

## 2020-09-05 NOTE — Assessment & Plan Note (Signed)
Stable overall, for contd lunesta prn qhs

## 2020-09-05 NOTE — Assessment & Plan Note (Signed)
BP Readings from Last 3 Encounters:  09/03/20 (!) 156/78  08/30/20 (!) 150/59  07/25/20 (!) 155/77   Mild uncontrolled, declines further med tx for now given weakness, pt to continue medical treatment norvasc, avapro

## 2020-09-05 NOTE — Assessment & Plan Note (Signed)
With mild recent worsening, for linzess prn,  to f/u any worsening symptoms or concerns

## 2020-09-05 NOTE — Assessment & Plan Note (Signed)
Pt to continue low chol diet, exercise, declines statin

## 2020-09-05 NOTE — Assessment & Plan Note (Signed)
Stable overall, declines need for further med tx

## 2020-09-06 ENCOUNTER — Telehealth: Payer: Self-pay

## 2020-09-10 ENCOUNTER — Ambulatory Visit (INDEPENDENT_AMBULATORY_CARE_PROVIDER_SITE_OTHER): Payer: Medicare Other | Admitting: Internal Medicine

## 2020-09-10 ENCOUNTER — Encounter: Payer: Self-pay | Admitting: Internal Medicine

## 2020-09-10 ENCOUNTER — Other Ambulatory Visit: Payer: Self-pay

## 2020-09-10 VITALS — BP 152/76 | HR 86 | Temp 98.8°F | Ht 64.0 in | Wt 190.0 lb

## 2020-09-10 DIAGNOSIS — E559 Vitamin D deficiency, unspecified: Secondary | ICD-10-CM | POA: Diagnosis not present

## 2020-09-10 DIAGNOSIS — F5101 Primary insomnia: Secondary | ICD-10-CM

## 2020-09-10 DIAGNOSIS — E119 Type 2 diabetes mellitus without complications: Secondary | ICD-10-CM | POA: Diagnosis not present

## 2020-09-10 DIAGNOSIS — I10 Essential (primary) hypertension: Secondary | ICD-10-CM | POA: Diagnosis not present

## 2020-09-10 NOTE — Patient Instructions (Signed)
Please continue all other medications as before, and refills have been done if requested.  Please have the pharmacy call with any other refills you may need.  Please continue your efforts at being more active, low cholesterol diet, and weight control.  Please keep your appointments with your specialists as you may have planned     

## 2020-09-10 NOTE — Progress Notes (Signed)
Patient ID: Laura [name], female   DOB: 1945/11/24, 75 y.o.   MRN: [id number]        Chief Complaint: follow up HTN, insomnia, dm, low vit d       HPI:  Laura [name] is a 75 y.o. female here overall doing ok but has worsening insomnia in the past month, very diffiicult to get to sleep and stay asleep; Pt denies chest pain, increased sob or doe, wheezing, orthopnea, PND, increased LE swelling, palpitations, dizziness or syncope.   Pt denies polydipsia, polyuria, or new focal neuro s/s.  BP has been < 140/90 at home.         Wt Readings from Last 3 Encounters:  09/10/20 190 lb (86.2 kg)  09/03/20 191 lb 3.2 oz (86.7 kg)  08/30/20 192 lb 8 oz (87.3 kg)   BP Readings from Last 3 Encounters:  09/10/20 (!) 152/76  09/03/20 (!) 156/78  08/30/20 (!) 150/59         Past Medical History:  Diagnosis Date   Anemia    Anxiety    Arthritis    back of neck, bones spurs on neck   Cancer (HCC)    ovarian cancer   Cataract    Cervical disc disease    Diabetes mellitus    Family history of thyroid cancer    Heart murmur    Hyperlipidemia    Hypertension    Mucoid cyst of joint    right thumb   Neuropathy    Port-A-Cath in place 07/21/2019   Reflux    Sleep apnea    wears CPAP nightly   Vertigo    Past Surgical History:  Procedure Laterality Date   ABDOMINAL HYSTERECTOMY     vaginal   BLADDER SURGERY     BREAST EXCISIONAL BIOPSY Bilateral    BREAST SURGERY     CHOLECYSTECTOMY     COLONOSCOPY     fibroids removed     breast (both breasts)   IR IMAGING GUIDED PORT INSERTION  07/26/2019   right   MASS EXCISION Right 06/26/2016   Procedure: EXCISION MUCOID TUMOR RIGHT THUMB, IP RIGHT THUMB;  Surgeon: Daryll Brod, MD;  Location: [facility];  Service: Orthopedics;  Laterality: Right;   PARTIAL HYSTERECTOMY     ROBOTIC ASSISTED BILATERAL SALPINGO OOPHERECTOMY N/A 01/03/2020   Procedure: XI ROBOTIC ASSISTED BILATERAL SALPINGO OOPHORECTOMY, RADICAL TUMOR DEBULKING;   Surgeon: Everitt Amber, MD;  Location: [facility];  Service: Gynecology;  Laterality: N/A;   ROBOTIC PELVIC AND PARA-AORTIC LYMPH NODE DISSECTION N/A 01/03/2020   Procedure: XI ROBOTIC PARA-AORTIC LYMPHADENECTOMY;  Surgeon: Everitt Amber, MD;  Location: [facility];  Service: Gynecology;  Laterality: N/A;    reports that she has never smoked. She has never used smokeless tobacco. She reports that she does not drink alcohol and does not use drugs. family history includes Diabetes in her father, maternal aunt, and sister; Hypertension in her father, mother, sister, and another family member; Stroke in her sister; Thyroid cancer (age of onset: 8) in her daughter. Allergies  Allergen Reactions   Ciprofloxacin Swelling    Torn tendon   Hydrocodone Bit-Homatrop Mbr Other (See Comments)    Vertigo *pt strongly prefers to never take*   Sulfa Antibiotics Hives, Itching and Swelling    Tongue swells   Augmentin [Amoxicillin-Pot Clavulanate]     Diarrhea; can take PCN/ Amoxicillin   Codeine Itching   Crestor [Rosuvastatin Calcium] Other (See Comments)    Did something to  memory     Doxycycline Other (See Comments)    Severe rectal Gas.   Gabapentin     disoriented   Keflex [Cephalexin] Diarrhea and Nausea And Vomiting   Naproxen Other (See Comments)    Stomach cramps   Statins     Muscle weakness   Prednisone Anxiety    *pt strongly prefers to never be given prednisone*    Current Outpatient Medications on File Prior to Visit  Medication Sig Dispense Refill   acetaminophen (TYLENOL) 500 MG tablet Take 1 tablet (500 mg total) by mouth every 6 (six) hours as needed. 30 tablet 0   amLODipine (NORVASC) 5 MG tablet TAKE 1 TABLET (5 MG TOTAL) BY MOUTH DAILY. 90 tablet 3   Blood Glucose Monitoring Suppl (ONE TOUCH ULTRA 2) w/Device KIT Use as directed 1 each 0   Camphor-Eucalyptus-Menthol (VICKS VAPORUB EX) Apply 1 application topically.     Cholecalciferol (VITAMIN D3) 125 MCG (5000 UT) TABS Take  10,000-15,000 Units by mouth daily.     eszopiclone (LUNESTA) 2 MG TABS tablet Take 1 tablet (2 mg total) by mouth at bedtime as needed for sleep. Take immediately before bedtime 90 tablet 1   famotidine (PEPCID) 20 MG tablet Take 20 mg by mouth 2 (two) times daily as needed for heartburn or indigestion.     feeding supplement (BOOST HIGH PROTEIN) LIQD Take 237 mLs by mouth daily.     glimepiride (AMARYL) 2 MG tablet Take 1 tablet (2 mg total) by mouth daily before breakfast. 90 tablet 3   irbesartan (AVAPRO) 75 MG tablet Take 1 tablet (75 mg total) by mouth daily. 90 tablet 3   Lancets (ONETOUCH ULTRASOFT) lancets USE TO CHECK BLOOD SUGAR TWICE DAILY 100 each 3   linaclotide (LINZESS) 72 MCG capsule Take 1 capsule (72 mcg total) by mouth daily as needed. 30 capsule 5   loperamide (IMODIUM) 2 MG capsule Take 2 mg by mouth as needed for diarrhea or loose stools.      loratadine (CLARITIN) 10 MG tablet Take 10 mg by mouth daily as needed (allergies.).      LORazepam (ATIVAN) 0.5 MG tablet Take 1 tablet (0.5 mg total) by mouth 2 (two) times daily as needed for anxiety. 60 tablet 1   ONETOUCH ULTRA test strip USE TO CHECK BLOOD SUGARS TWO TIMES DAILY 100 strip 3   OVER THE COUNTER MEDICATION Apply 1 application topically daily as needed (pain). Triderma otc pain cream     prochlorperazine (COMPAZINE) 10 MG tablet Take 10 mg by mouth every 6 (six) hours as needed for nausea or vomiting.     psyllium (METAMUCIL) 58.6 % powder Take 1 packet by mouth in the morning and at bedtime.     solifenacin (VESICARE) 5 MG tablet TAKE 1 TABLET (5 MG TOTAL) BY MOUTH DAILY. (Patient taking differently: Take 5 mg by mouth daily as needed (bladder pain/spasms.).) 90 tablet 3   vitamin B-12 (CYANOCOBALAMIN) 1000 MCG tablet Take 1,000 mcg by mouth daily.      No current facility-administered medications on file prior to visit.        ROS:  All others reviewed and negative.  Objective        PE:  BP (!) 152/76 (BP  Location: Right Arm, Patient Position: Sitting, Cuff Size: Large)   Pulse 86   Temp 98.8 F (37.1 C) (Oral)   Ht _0  (1.626 m)   Wt 190 lb (86.2 kg)   SpO2 99%  BMI 32.61 kg/m                 Constitutional: Pt appears in NAD               HENT: Head: NCAT.                Right Ear: External ear normal.                 Left Ear: External ear normal.                Eyes: . Pupils are equal, round, and reactive to light. Conjunctivae and EOM are normal               Nose: without d/c or deformity               Neck: Neck supple. Gross normal ROM               Cardiovascular: Normal rate and regular rhythm.                 Pulmonary/Chest: Effort normal and breath sounds without rales or wheezing.                Abd:  Soft, NT, ND, + BS, no organomegaly               Neurological: Pt is alert. At baseline orientation, motor grossly intact               Skin: Skin is warm. No rashes, no other new lesions, LE edema - none               Psychiatric: Pt behavior is normal without agitation   Micro: none  Cardiac tracings I have personally interpreted today:  none  Pertinent Radiological findings (summarize): none   Lab Results  Component Value Date   WBC 8.0 08/24/2020   HGB 11.8 (L) 08/24/2020   HCT 35.6 (L) 08/24/2020   PLT 202 08/24/2020   GLUCOSE 124 (H) 08/24/2020   CHOL 270 (H) 02/28/2020   TRIG 286.0 (H) 02/28/2020   HDL 63.20 02/28/2020   LDLDIRECT 172.0 02/28/2020   LDLCALC 166 (H) 04/12/2014   ALT 14 08/24/2020   AST 16 08/24/2020   NA 136 08/24/2020   K 4.2 08/24/2020   CL 105 08/24/2020   CREATININE 0.76 08/24/2020   BUN 19 08/24/2020   CO2 26 08/24/2020   TSH 2.92 02/28/2020   INR 1.0 06/06/2019   HGBA1C 6.3 (A) 09/03/2020   MICROALBUR 1.4 02/28/2020   Assessment/Plan:  ALIZ MERITT is a 75 y.o. Black or African American [2] female with  has a past medical history of Anemia, Anxiety, Arthritis, Cancer ([place]), Cataract, Cervical disc disease, Diabetes  mellitus, Family history of thyroid cancer, Heart murmur, Hyperlipidemia, Hypertension, Mucoid cyst of joint, Neuropathy, Port-A-Cath in place (07/21/2019), Reflux, Sleep apnea, and Vertigo.  Vitamin D deficiency Last vitamin D Lab Results  Component Value Date   VD25OH 51.43 02/28/2020   Stable, cont oral replacement   Diabetes mellitus type 2, noninsulin dependent ([place]) Lab Results  Component Value Date   HGBA1C 6.3 (A) 09/03/2020   Stable, pt to continue current medical treatment glimeparide   Insomnia Uncontrolled worsening, for change to belsomra,  to f/u any worsening symptoms or concerns  Hypertension Mild uncontrolled here, but controlled at home per pt, declnes change in med tx for now; norvasc, irbesartan  BP Readings from Last 3 Encounters:  09/10/20 Marland Kitchen)  152/76  09/03/20 (!) 156/78  08/30/20 (!) 150/59   Followup: Return in 6 months (on 03/12/2021), or if symptoms worsen or fail to improve.  Cathlean Cower, MD 09/13/2020 9:23 PM [facility]

## 2020-09-11 NOTE — Telephone Encounter (Signed)
Medication refilled on 09/03/20 to pharmacy requested.

## 2020-09-12 ENCOUNTER — Telehealth: Payer: Self-pay | Admitting: Gastroenterology

## 2020-09-12 ENCOUNTER — Encounter: Payer: Self-pay | Admitting: Internal Medicine

## 2020-09-12 NOTE — Telephone Encounter (Signed)
Pt would like to be seen asap. She states that the medication that she was given to help with IBS are not working and her sxs are not uncontrollable. Pls call her.

## 2020-09-12 NOTE — Telephone Encounter (Signed)
Left message on machine to call back  

## 2020-09-13 ENCOUNTER — Encounter: Payer: Self-pay | Admitting: Internal Medicine

## 2020-09-13 ENCOUNTER — Telehealth: Payer: Self-pay | Admitting: Internal Medicine

## 2020-09-13 NOTE — Telephone Encounter (Signed)
  Patient reports she thinks her issue with constipation/ diarrhea is coming from Irbesartan  Patient would like to stop taking irbesartan (AVAPRO) 75 MG tablet

## 2020-09-13 NOTE — Telephone Encounter (Signed)
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

## 2020-09-13 NOTE — Telephone Encounter (Signed)
Patient stated, "I trust you Dr. Jenny Reichmann as my doctor, but I would really like if you change my irbesartan to something else". Per patient, read on [employer] that irbesartan causes the symptoms (constipation, diarrhea, and fatigue) she's been experiencing. Explained to patient as seen below by Dr. Jenny Reichmann, but patient would like to switch medication as she states that she did not take medication today and has felt much better. Please advise

## 2020-09-13 NOTE — Assessment & Plan Note (Signed)
Uncontrolled worsening, for change to belsomra,  to f/u any worsening symptoms or concerns

## 2020-09-13 NOTE — Assessment & Plan Note (Signed)
Lab Results  Component Value Date   HGBA1C 6.3 (A) 09/03/2020   Stable, pt to continue current medical treatment glimeparide

## 2020-09-13 NOTE — Telephone Encounter (Signed)
Ok to let pt know, we can say confidently that the irbesartan is not the cause, as it does not do that in terms of side effects (even if it says this the paper that comes with the med from the pharmacy regarding side effects)

## 2020-09-13 NOTE — Assessment & Plan Note (Signed)
Last vitamin D Lab Results  Component Value Date   VD25OH 51.43 02/28/2020   Stable, cont oral replacement

## 2020-09-13 NOTE — Assessment & Plan Note (Signed)
Mild uncontrolled here, but controlled at home per pt, declnes change in med tx for now; norvasc, irbesartan  BP Readings from Last 3 Encounters:  09/10/20 (!) 152/76  09/03/20 (!) 156/78  08/30/20 (!) 150/59

## 2020-09-13 NOTE — Telephone Encounter (Signed)
I spoke with the pt and she describes IBS with constipation diarrhea.  She states that she is unable to live a normal life because she never knows when she will have to run to the bathroom.  She had a colon on 07/02/20 with Dr Loletha Carrow for abnormal CT scan.  She was told to use miralax for the constipation but then she has diarrhea despite titrating the miralax even stopping.  An appt has been made for 09/21/20 with Alonza Bogus.  The pt thanked me for the call.

## 2020-09-14 NOTE — Telephone Encounter (Signed)
Patient notified

## 2020-09-19 ENCOUNTER — Other Ambulatory Visit (HOSPITAL_COMMUNITY): Payer: Self-pay

## 2020-09-19 ENCOUNTER — Ambulatory Visit: Payer: Medicare Other | Admitting: Specialist

## 2020-09-21 ENCOUNTER — Encounter: Payer: Self-pay | Admitting: Gastroenterology

## 2020-09-21 ENCOUNTER — Ambulatory Visit (INDEPENDENT_AMBULATORY_CARE_PROVIDER_SITE_OTHER): Payer: Medicare Other | Admitting: Gastroenterology

## 2020-09-21 VITALS — BP 140/80 | HR 94 | Ht 63.0 in | Wt 191.4 lb

## 2020-09-21 DIAGNOSIS — K581 Irritable bowel syndrome with constipation: Secondary | ICD-10-CM | POA: Diagnosis not present

## 2020-09-21 DIAGNOSIS — K5909 Other constipation: Secondary | ICD-10-CM

## 2020-09-21 NOTE — Progress Notes (Signed)
09/21/2020 Laura [name] [id number] 1945/09/23   HISTORY OF PRESENT ILLNESS: This is a 75 year old female who is a patient of Dr. Ardis Hughs.  She was last seen by me in June 2022 to discuss colonoscopy for evaluation regarding abnormal CAT scan that she had performed.  At that point she also reported irregular bowel habits.  She tends to be constipated, but then on occasion she will have diarrhea.  When she gets diarrhea then she will take Imodium, which causes her to have constipation again.  She says that she tried taking MiraLAX, but it caused her to "poop on herself".  Metamucil did not help her.  Colonoscopy just a couple months ago in June 2022 showed diverticulosis of the sigmoid colon.  Preparation of the colon was fair.   Past Medical History:  Diagnosis Date   Anemia    Anxiety    Arthritis    back of neck, bones spurs on neck   Cancer (HCC)    ovarian cancer   Cataract    Cervical disc disease    Diabetes mellitus    Family history of thyroid cancer    Heart murmur    Hyperlipidemia    Hypertension    Mucoid cyst of joint    right thumb   Neuropathy    Port-A-Cath in place 07/21/2019   Reflux    Sleep apnea    wears CPAP nightly   Vertigo    Past Surgical History:  Procedure Laterality Date   ABDOMINAL HYSTERECTOMY     vaginal   BLADDER SURGERY     BREAST EXCISIONAL BIOPSY Bilateral    BREAST SURGERY     CHOLECYSTECTOMY     COLONOSCOPY     fibroids removed     breast (both breasts)   IR IMAGING GUIDED PORT INSERTION  07/26/2019   right   MASS EXCISION Right 06/26/2016   Procedure: EXCISION MUCOID TUMOR RIGHT THUMB, IP RIGHT THUMB;  Surgeon: Daryll Brod, MD;  Location: [place];  Service: Orthopedics;  Laterality: Right;   PARTIAL HYSTERECTOMY     ROBOTIC ASSISTED BILATERAL SALPINGO OOPHERECTOMY N/A 01/03/2020   Procedure: XI ROBOTIC ASSISTED BILATERAL SALPINGO OOPHORECTOMY, RADICAL TUMOR DEBULKING;  Surgeon: Everitt Amber, MD;  Location: [facility];  Service: Gynecology;  Laterality: N/A;   ROBOTIC PELVIC AND PARA-AORTIC LYMPH NODE DISSECTION N/A 01/03/2020   Procedure: XI ROBOTIC PARA-AORTIC LYMPHADENECTOMY;  Surgeon: Everitt Amber, MD;  Location: [facility];  Service: Gynecology;  Laterality: N/A;    reports that she has never smoked. She has never used smokeless tobacco. She reports that she does not drink alcohol and does not use drugs. family history includes Diabetes in her father, maternal aunt, and sister; Hypertension in her father, mother, sister, and another family member; Stroke in her sister; Thyroid cancer (age of onset: 50) in her daughter. Allergies  Allergen Reactions   Ciprofloxacin Swelling    Torn tendon   Hydrocodone Bit-Homatrop Mbr Other (See Comments)    Vertigo *pt strongly prefers to never take*   Sulfa Antibiotics Hives, Itching and Swelling    Tongue swells   Augmentin [Amoxicillin-Pot Clavulanate]     Diarrhea; can take PCN/ Amoxicillin   Codeine Itching   Crestor [Rosuvastatin Calcium] Other (See Comments)    Did something to memory     Doxycycline Other (See Comments)    Severe rectal Gas.   Gabapentin     disoriented   Keflex [Cephalexin] Diarrhea and Nausea And Vomiting  Naproxen Other (See Comments)    Stomach cramps   Statins     Muscle weakness   Prednisone Anxiety    *pt strongly prefers to never be given prednisone*       Outpatient Encounter Medications as of 09/21/2020  Medication Sig   acetaminophen (TYLENOL) 500 MG tablet Take 1 tablet (500 mg total) by mouth every 6 (six) hours as needed.   amLODipine (NORVASC) 5 MG tablet TAKE 1 TABLET (5 MG TOTAL) BY MOUTH DAILY.   Blood Glucose Monitoring Suppl (ONE TOUCH ULTRA 2) w/Device KIT Use as directed   Camphor-Eucalyptus-Menthol (VICKS VAPORUB EX) Apply 1 application topically.   Cholecalciferol (VITAMIN D3) 125 MCG (5000 UT) TABS Take 10,000-15,000 Units by mouth daily.   famotidine (PEPCID) 20 MG tablet Take 20 mg by mouth 2 (two)  times daily as needed for heartburn or indigestion.   feeding supplement (BOOST HIGH PROTEIN) LIQD Take 237 mLs by mouth daily.   glimepiride (AMARYL) 2 MG tablet Take 1 tablet (2 mg total) by mouth daily before breakfast.   irbesartan (AVAPRO) 75 MG tablet Take 1 tablet (75 mg total) by mouth daily.   Lancets (ONETOUCH ULTRASOFT) lancets USE TO CHECK BLOOD SUGAR TWICE DAILY   linaclotide (LINZESS) 72 MCG capsule Take 1 capsule (72 mcg total) by mouth daily as needed.   loperamide (IMODIUM) 2 MG capsule Take 2 mg by mouth as needed for diarrhea or loose stools.    loratadine (CLARITIN) 10 MG tablet Take 10 mg by mouth daily as needed (allergies.).    ONETOUCH ULTRA test strip USE TO CHECK BLOOD SUGARS TWO TIMES DAILY   OVER THE COUNTER MEDICATION Apply 1 application topically daily as needed (pain). Triderma otc pain cream   prochlorperazine (COMPAZINE) 10 MG tablet Take 10 mg by mouth every 6 (six) hours as needed for nausea or vomiting.   solifenacin (VESICARE) 5 MG tablet TAKE 1 TABLET (5 MG TOTAL) BY MOUTH DAILY. (Patient taking differently: Take 5 mg by mouth daily as needed (bladder pain/spasms.).)   vitamin B-12 (CYANOCOBALAMIN) 1000 MCG tablet Take 1,000 mcg by mouth daily.    LORazepam (ATIVAN) 0.5 MG tablet Take 1 tablet (0.5 mg total) by mouth 2 (two) times daily as needed for anxiety. (Patient taking differently: Take 0.5 mg by mouth as needed for anxiety.)   [DISCONTINUED] eszopiclone (LUNESTA) 2 MG TABS tablet Take 1 tablet (2 mg total) by mouth at bedtime as needed for sleep. Take immediately before bedtime   [DISCONTINUED] psyllium (METAMUCIL) 58.6 % powder Take 1 packet by mouth in the morning and at bedtime.   No facility-administered encounter medications on file as of 09/21/2020.     REVIEW OF SYSTEMS  : All other systems reviewed and negative except where noted in the History of Present Illness.   PHYSICAL EXAM: BP 140/80   Pulse 94   Ht _0  (1.6 m)   Wt 191 lb 6 oz  (86.8 kg)   BMI 33.90 kg/m  General: Well developed AA female in no acute distress Head: Normocephalic and atraumatic Eyes:  Sclerae anicteric, conjunctiva pink. Ears: Normal auditory acuity Lungs: Clear throughout to auscultation; no W/R/R. Heart: Regular rate and rhythm; no M/R/G. Abdomen: Soft, non-distended.  BS present.  Non-tender. Musculoskeletal: Symmetrical with no gross deformities  Skin: No lesions on visible extremities Extremities: No edema  Neurological: Alert oriented x 4, grossly non-focal Psychological:  Alert and cooperative. Normal mood and affect  ASSESSMENT AND PLAN: *75 year old female with complaints of constipation  and occasional episodes of diarrhea.  I think underlyingly she is constipated and then may have some leakage of loose stools around that or may have a self catharsis which results in diarrhea.  Then she she treats the diarrhea with Imodium and is left back with constipation again.  I think we more aggressively need to treat the underlying constipation.  She will try MiraLAX daily and add Benefiber 2 teaspoons mixed in 8 ounces of liquid daily as well in order to help create bulk to the stool as she felt like the MiraLAX alone caused some loose stools previously.  Her PCP had also given her Linzess 72 mcg daily, which she has not tried yet.  That is certainly an option if we see that the MiraLAX and Benefiber combination is not working.  If she were to go to that regimen then I would continue the Benefiber for bulking effect as well.  She will contact us back in the next couple of weeks with an update.  CC:  Biagio Borg, MD

## 2020-09-21 NOTE — Patient Instructions (Signed)
Miralax 1 capful in 8 ounces of liquid daily.   Start Benefiber  2 teaspoons in 8 ounces of liquid daily.  If no improvement in 10 days may start Linzess 72 mcg with Benefiber.  Send update via mychart.   If you are age 76 or older, your body mass index should be between 23-30. Your Body mass index is 33.9 kg/m. If this is out of the aforementioned range listed, please consider follow up with your Primary Care Provider.  If you are age 66 or younger, your body mass index should be between 19-25. Your Body mass index is 33.9 kg/m. If this is out of the aformentioned range listed, please consider follow up with your Primary Care Provider.   __________________________________________________________  The  GI providers would like to encourage you to use [facility] to communicate with providers for non-urgent requests or questions.  Due to long hold times on the telephone, sending your provider a message by [facility] may be a faster and more efficient way to get a response.  Please allow 48 business hours for a response.  Please remember that this is for non-urgent requests.

## 2020-09-24 ENCOUNTER — Other Ambulatory Visit: Payer: Self-pay

## 2020-09-24 ENCOUNTER — Emergency Department (HOSPITAL_COMMUNITY): Payer: Medicare Other

## 2020-09-24 ENCOUNTER — Other Ambulatory Visit (HOSPITAL_COMMUNITY): Payer: Self-pay

## 2020-09-24 ENCOUNTER — Emergency Department (HOSPITAL_COMMUNITY)
Admission: EM | Admit: 2020-09-24 | Discharge: 2020-09-24 | Disposition: A | Discharge status: Home / Self Care | Payer: Medicare Other | Attending: Emergency Medicine | Admitting: Emergency Medicine

## 2020-09-24 ENCOUNTER — Encounter: Payer: Self-pay | Admitting: Internal Medicine

## 2020-09-24 DIAGNOSIS — Z7984 Long term (current) use of oral hypoglycemic drugs: Secondary | ICD-10-CM | POA: Diagnosis not present

## 2020-09-24 DIAGNOSIS — N1330 Unspecified hydronephrosis: Secondary | ICD-10-CM

## 2020-09-24 DIAGNOSIS — E119 Type 2 diabetes mellitus without complications: Secondary | ICD-10-CM | POA: Insufficient documentation

## 2020-09-24 DIAGNOSIS — R1030 Lower abdominal pain, unspecified: Secondary | ICD-10-CM | POA: Diagnosis present

## 2020-09-24 DIAGNOSIS — Z79899 Other long term (current) drug therapy: Secondary | ICD-10-CM | POA: Insufficient documentation

## 2020-09-24 DIAGNOSIS — Z8543 Personal history of malignant neoplasm of ovary: Secondary | ICD-10-CM | POA: Insufficient documentation

## 2020-09-24 DIAGNOSIS — I10 Essential (primary) hypertension: Secondary | ICD-10-CM | POA: Insufficient documentation

## 2020-09-24 DIAGNOSIS — Z8616 Personal history of COVID-19: Secondary | ICD-10-CM | POA: Diagnosis not present

## 2020-09-24 DIAGNOSIS — N130 Hydronephrosis with ureteropelvic junction obstruction: Secondary | ICD-10-CM | POA: Diagnosis not present

## 2020-09-24 DIAGNOSIS — R109 Unspecified abdominal pain: Secondary | ICD-10-CM

## 2020-09-24 LAB — CBC WITH DIFFERENTIAL/PLATELET
Abs Immature Granulocytes: 0.03 10*3/uL (ref 0.00–0.07)
Basophils Absolute: 0 10*3/uL (ref 0.0–0.1)
Basophils Relative: 0 %
Eosinophils Absolute: 0.1 10*3/uL (ref 0.0–0.5)
Eosinophils Relative: 1 %
HCT: 36.2 % (ref 36.0–46.0)
Hemoglobin: 12.2 g/dL (ref 12.0–15.0)
Immature Granulocytes: 0 %
Lymphocytes Relative: 32 %
Lymphs Abs: 2.4 10*3/uL (ref 0.7–4.0)
MCH: 30.6 pg (ref 26.0–34.0)
MCHC: 33.7 g/dL (ref 30.0–36.0)
MCV: 90.7 fL (ref 80.0–100.0)
Monocytes Absolute: 0.5 10*3/uL (ref 0.1–1.0)
Monocytes Relative: 7 %
Neutro Abs: 4.6 10*3/uL (ref 1.7–7.7)
Neutrophils Relative %: 60 %
Platelets: 214 10*3/uL (ref 150–400)
RBC: 3.99 MIL/uL (ref 3.87–5.11)
RDW: 13.9 % (ref 11.5–15.5)
WBC: 7.7 10*3/uL (ref 4.0–10.5)
nRBC: 0 % (ref 0.0–0.2)

## 2020-09-24 LAB — URINALYSIS, ROUTINE W REFLEX MICROSCOPIC
Bilirubin Urine: NEGATIVE
Glucose, UA: NEGATIVE mg/dL
Hgb urine dipstick: NEGATIVE
Ketones, ur: NEGATIVE mg/dL
Nitrite: NEGATIVE
Protein, ur: NEGATIVE mg/dL
Specific Gravity, Urine: <1.005 — ABNORMAL LOW (ref 1.005–1.030)
pH: 7 (ref 5.0–8.0)

## 2020-09-24 LAB — BASIC METABOLIC PANEL
Anion gap: 10 (ref 5–15)
BUN: 22 mg/dL (ref 8–23)
CO2: 20 mmol/L — ABNORMAL LOW (ref 22–32)
Calcium: 9.9 mg/dL (ref 8.9–10.3)
Chloride: 106 mmol/L (ref 98–111)
Creatinine, Ser: 0.95 mg/dL (ref 0.44–1.00)
GFR, Estimated: >60 mL/min (ref >60)
Glucose, Bld: 118 mg/dL — ABNORMAL HIGH (ref 70–99)
Potassium: 4.2 mmol/L (ref 3.5–5.1)
Sodium: 136 mmol/L (ref 135–145)

## 2020-09-24 LAB — URINALYSIS, MICROSCOPIC (REFLEX)

## 2020-09-24 MED ORDER — ONDANSETRON HCL 4 MG/2ML IJ SOLN
4.0000 mg | Freq: Once | INTRAMUSCULAR | Status: AC
  Administered 2020-09-24: 4 mg via INTRAVENOUS
  Filled 2020-09-24: qty 2

## 2020-09-24 MED ORDER — IOHEXOL 350 MG/ML SOLN
75.0000 mL | Freq: Once | INTRAVENOUS | Status: AC | PRN
  Administered 2020-09-24: 75 mL via INTRAVENOUS

## 2020-09-24 MED ORDER — MORPHINE SULFATE 15 MG PO TABS: 15.0000 mg | ORAL_TABLET | Freq: Four times a day (QID) | ORAL | 0 refills | Status: DC | PRN

## 2020-09-24 MED ORDER — MORPHINE SULFATE (PF) 2 MG/ML IV SOLN
2.0000 mg | Freq: Once | INTRAVENOUS | Status: AC
  Administered 2020-09-24: 2 mg via INTRAVENOUS
  Filled 2020-09-24: qty 1

## 2020-09-24 NOTE — Progress Notes (Signed)
I agree with the above note, plan 

## 2020-09-24 NOTE — ED Provider Notes (Signed)
[facility] Provider Note   CSN: [id number] Arrival date & time: 09/24/20  0054     History No chief complaint on file.   Laura [name] is a 75 y.o. female.  Patient presents to the emergency department for evaluation of right-sided back and abdominal pain.  Patient reports that she has been having some mild intermittent pains on the right side for several months.  Today, however, she developed pain in the posterior flank that lasted for some time.  This pain resolved and the pain moved into the right groin area.  Pain is much more severe than what she has had in the past, currently 8 out of 10.      Past Medical History:  Diagnosis Date   Anemia    Anxiety    Arthritis    back of neck, bones spurs on neck   Cancer (HCC)    ovarian cancer   Cataract    Cervical disc disease    Diabetes mellitus    Family history of thyroid cancer    Heart murmur    Hyperlipidemia    Hypertension    Mucoid cyst of joint    right thumb   Neuropathy    Port-A-Cath in place 07/21/2019   Reflux    Sleep apnea    wears CPAP nightly   Vertigo     Patient Active Problem List   Diagnosis Date Noted   COVID-19 virus infection 07/15/2020   Abnormal CT scan, colon 06/28/2020   Left leg pain 05/19/2020   Statin myopathy 02/27/2020   Ovarian cancer ([place]) 01/03/2020   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   Nontraumatic tear of right tibialis posterior tendon 12/14/2019   Right ankle pain 12/07/2019   Right ankle tendonitis 11/11/2019   Aortic atherosclerosis ([place]) 10/12/2019   Dehydration 10/06/2019   Port-A-Cath in place 07/21/2019   Genetic testing 07/15/2019   Elevated CA-125 07/07/2019   Family history of thyroid cancer    Malignant neoplasm of both ovaries 06/20/2019   Paronychia, finger, left 04/15/2019   Vaginitis 04/15/2019   Abdominal pain 04/04/2019   Retroperitoneal mass 04/03/2019   Acute gouty arthritis  12/17/2018   Bilateral hearing loss 12/21/2017   Eustachian tube disorder, bilateral 12/21/2017   Cough 02/23/2017   Left hand pain 05/26/2016   Mucoid cyst, joint 05/26/2016   Pain in finger of right hand 05/26/2016   Primary osteoarthritis of both first carpometacarpal joints 05/26/2016   Skin avulsion 05/26/2016   Adhesive capsulitis of left shoulder 03/20/2016   Fatigue 12/16/2015   Acute bronchitis 10/31/2015   Urinary frequency 03/09/2015   Hearing loss, sensorineural, asymmetrical 11/09/2014   Vestibular hypofunction, left 11/09/2014   Obesity 07/25/2014   Orthostatic hypotension 07/07/2014   Upper airway cough syndrome 06/26/2014   Diarrhea 05/02/2014   Dizziness 04/12/2014   Neuropathy    Rash and nonspecific skin eruption 08/30/2013   Paresthesia of both feet 08/30/2013   Blurred vision 02/15/2013   Eye pain 02/15/2013   Sinusitis, chronic 10/07/2012   Left shoulder pain 04/15/2012   Encounter for well adult exam with abnormal findings 10/22/2011   Cervical disc disease 10/22/2011   Bilateral hand pain 08/05/2011   SNHL (sensorineural hearing loss) 08/04/2011   Fibrocystic breast disease 07/02/2011   Hypertension 07/01/2011   GERD (gastroesophageal reflux disease) 06/29/2011   Bloating 06/29/2011   Back pain 03/13/2011   Vertigo 01/04/2011   Nystagmus 01/04/2011   Insomnia 10/21/2010  Other constipation 08/17/2009   Vitamin D deficiency 05/15/2009   DYSPNEA 03/13/2009   Depression with anxiety 01/16/2009   Irritable bowel syndrome with constipation 01/16/2009   Primary osteoarthritis of both hands 12/15/2008   Diabetes mellitus type 2, noninsulin dependent ([place]) 11/03/2008   Hyperlipidemia 11/03/2008   Anxiety state 11/03/2008   OVERACTIVE BLADDER 11/03/2008   OSA (obstructive sleep apnea) 11/03/2008   MURMUR 11/03/2008    Past Surgical History:  Procedure Laterality Date   ABDOMINAL HYSTERECTOMY     vaginal   BLADDER SURGERY     BREAST EXCISIONAL  BIOPSY Bilateral    BREAST SURGERY     CHOLECYSTECTOMY     COLONOSCOPY     fibroids removed     breast (both breasts)   IR IMAGING GUIDED PORT INSERTION  07/26/2019   right   MASS EXCISION Right 06/26/2016   Procedure: EXCISION MUCOID TUMOR RIGHT THUMB, IP RIGHT THUMB;  Surgeon: Daryll Brod, MD;  Location: [place];  Service: Orthopedics;  Laterality: Right;   PARTIAL HYSTERECTOMY     ROBOTIC ASSISTED BILATERAL SALPINGO OOPHERECTOMY N/A 01/03/2020   Procedure: XI ROBOTIC ASSISTED BILATERAL SALPINGO OOPHORECTOMY, RADICAL TUMOR DEBULKING;  Surgeon: Everitt Amber, MD;  Location: [facility];  Service: Gynecology;  Laterality: N/A;   ROBOTIC PELVIC AND PARA-AORTIC LYMPH NODE DISSECTION N/A 01/03/2020   Procedure: XI ROBOTIC PARA-AORTIC LYMPHADENECTOMY;  Surgeon: Everitt Amber, MD;  Location[facility];  Service: Gynecology;  Laterality: N/A;     OB History   No obstetric history on file.     Family History  Problem Relation Age of Onset   Hypertension Other    Diabetes Father    Hypertension Father    Diabetes Sister    Hypertension Mother    Hypertension Sister    Stroke Sister    Diabetes Maternal Aunt    Thyroid cancer Daughter 65   Colon cancer Neg Hx    Esophageal cancer Neg Hx    Stomach cancer Neg Hx    Rectal cancer Neg Hx    Breast cancer Neg Hx    Endometrial cancer Neg Hx    Ovarian cancer Neg Hx     Social History   Tobacco Use   Smoking status: Never   Smokeless tobacco: Never  Vaping Use   Vaping Use: Never used  Substance Use Topics   Alcohol use: No    Alcohol/week: 0.0 standard drinks   Drug use: No    Home Medications Prior to Admission medications   Medication Sig Start Date End Date Taking? Authorizing Provider  morphine (MSIR) 15 MG tablet Take 1 tablet (15 mg total) by mouth every 6 (six) hours as needed for severe pain. 09/24/20  Yes Wynne Rozak, Gwenyth Allegra, MD  acetaminophen (TYLENOL) 500 MG tablet Take 1 tablet (500 mg total) by mouth  every 6 (six) hours as needed. 05/16/20   Jessy Oto, MD  amLODipine (NORVASC) 5 MG tablet TAKE 1 TABLET (5 MG TOTAL) BY MOUTH DAILY. 08/16/20 08/16/21  Biagio Borg, MD  Blood Glucose Monitoring Suppl (ONE TOUCH ULTRA 2) w/Device KIT Use as directed 04/06/15   Biagio Borg, MD  Camphor-Eucalyptus-Menthol [facility] VAPORUB EX) Apply 1 application topically.    [provider]  Cholecalciferol (VITAMIN D3) 125 MCG (5000 UT) TABS Take 10,000-15,000 Units by mouth daily.    [provider]  famotidine (PEPCID) 20 MG tablet Take 20 mg by mouth 2 (two) times daily as needed for heartburn or indigestion.  [provider]  feeding supplement (BOOST HIGH PROTEIN) LIQD Take 237 mLs by mouth daily.    [provider]  glimepiride (AMARYL) 2 MG tablet Take 1 tablet (2 mg total) by mouth daily before breakfast. 08/15/20   Biagio Borg, MD  irbesartan (AVAPRO) 75 MG tablet Take 1 tablet (75 mg total) by mouth daily. 05/16/20   Biagio Borg, MD  Lancets [facility] ULTRASOFT) lancets USE TO CHECK BLOOD SUGAR TWICE DAILY 06/27/19   Biagio Borg, MD  linaclotide [facility]) 72 MCG capsule Take 1 capsule (72 mcg total) by mouth daily as needed. 09/03/20   Biagio Borg, MD  loperamide (IMODIUM) 2 MG capsule Take 2 mg by mouth as needed for diarrhea or loose stools.     [provider]  loratadine (CLARITIN) 10 MG tablet Take 10 mg by mouth daily as needed (allergies.).     [provider]  LORazepam (ATIVAN) 0.5 MG tablet Take 1 tablet (0.5 mg total) by mouth 2 (two) times daily as needed for anxiety. Patient taking differently: Take 0.5 mg by mouth as needed for anxiety. 05/16/20   Biagio Borg, MD  [facility] ULTRA test strip USE TO CHECK BLOOD SUGARS TWO TIMES DAILY 06/27/19   Biagio Borg, MD  OVER THE COUNTER MEDICATION Apply 1 application topically daily as needed (pain). Triderma otc pain cream    [provider]  prochlorperazine (COMPAZINE) 10 MG tablet  Take 10 mg by mouth every 6 (six) hours as needed for nausea or vomiting.    [provider]  solifenacin (VESICARE) 5 MG tablet TAKE 1 TABLET (5 MG TOTAL) BY MOUTH DAILY. Patient taking differently: Take 5 mg by mouth daily as needed (bladder pain/spasms.). 10/19/19 10/18/20  Biagio Borg, MD  vitamin B-12 (CYANOCOBALAMIN) 1000 MCG tablet Take 1,000 mcg by mouth daily.     [provider]    Allergies    Ciprofloxacin, Hydrocodone bit-homatrop mbr, Sulfa antibiotics, Augmentin [amoxicillin-pot clavulanate], Codeine, Crestor [rosuvastatin calcium], Doxycycline, Gabapentin, Keflex [cephalexin], Naproxen, Statins, and Prednisone  Review of Systems   Review of Systems  Genitourinary:  Positive for flank pain.  All other systems reviewed and are negative.  Physical Exam Updated Vital Signs BP (!) 151/67   Pulse 71   Temp 98.1 F (36.7 C) (Oral)   Resp 17   Ht _0  (1.6 m)   Wt 86 kg   SpO2 100%   BMI 33.59 kg/m   Physical Exam Vitals and nursing note reviewed.  Constitutional:      General: She is not in acute distress.    Appearance: Normal appearance. She is well-developed.  HENT:     Head: Normocephalic and atraumatic.     Right Ear: Hearing normal.     Left Ear: Hearing normal.     Nose: Nose normal.  Eyes:     Conjunctiva/sclera: Conjunctivae normal.     Pupils: Pupils are equal, round, and reactive to light.  Cardiovascular:     Rate and Rhythm: Regular rhythm.     Heart sounds: S1 normal and S2 normal. No murmur heard.   No friction rub. No gallop.  Pulmonary:     Effort: Pulmonary effort is normal. No respiratory distress.     Breath sounds: Normal breath sounds.  Chest:     Chest wall: No tenderness.  Abdominal:     General: Bowel sounds are normal.     Palpations: Abdomen is soft.     Tenderness: There  is no abdominal tenderness. There is no guarding or rebound. Negative signs include Murphy's sign and McBurney's sign.     Hernia: No hernia  is present.  Musculoskeletal:        General: Normal range of motion.     Cervical back: Normal range of motion and neck supple.  Skin:    General: Skin is warm and dry.     Findings: No rash.  Neurological:     Mental Status: She is alert and oriented to person, place, and time.     GCS: GCS eye subscore is 4. GCS verbal subscore is 5. GCS motor subscore is 6.     Cranial Nerves: No cranial nerve deficit.     Sensory: No sensory deficit.     Coordination: Coordination normal.  Psychiatric:        Speech: Speech normal.        Behavior: Behavior normal.        Thought Content: Thought content normal.    ED Results / Procedures / Treatments   Labs (all labs ordered are listed, but only abnormal results are displayed) Labs Reviewed  BASIC METABOLIC PANEL - Abnormal; Notable for the following components:      Result Value   CO2 20 (*)    Glucose, Bld 118 (*)    All other components within normal limits  URINALYSIS, ROUTINE W REFLEX MICROSCOPIC - Abnormal; Notable for the following components:   Specific Gravity, Urine <1.005 (*)    Leukocytes,Ua TRACE (*)    All other components within normal limits  URINALYSIS, MICROSCOPIC (REFLEX) - Abnormal; Notable for the following components:   Bacteria, UA RARE (*)    All other components within normal limits  CBC WITH DIFFERENTIAL/PLATELET    EKG None  Radiology CT ABDOMEN PELVIS W CONTRAST  Result Date: 09/24/2020 CLINICAL DATA:  Right abdominal pain. History of ovarian adenocarcinoma, status post BSO with tumor debulking December 2021, status post chemotherapy. EXAM: CT ABDOMEN AND PELVIS WITH CONTRAST TECHNIQUE: Multidetector CT imaging of the abdomen and pelvis was performed using the standard protocol following bolus administration of intravenous contrast. CONTRAST:  21m OMNIPAQUE IOHEXOL 350 MG/ML SOLN COMPARISON:  08/24/2020 FINDINGS: Lower chest: Lung bases are clear. Hepatobiliary: Liver is within normal limits. Status post  cholecystectomy. No intrahepatic or extrahepatic ductal dilatation. Pancreas: Within normal limits. Spleen: Within normal limits. Adrenals/Urinary Tract: Adrenal glands are within normal limits. Kidneys are within normal limits. Moderate right hydroureteronephrosis on the basis of extrinsic compression by the patient's right para-aortic/aortocaval nodal mass, unchanged. Bladder is low lying but otherwise within normal limits. Stomach/Bowel: Stomach is notable for a small to moderate hiatal hernia. No evidence of bowel obstruction. Appendix is not discretely visualized. No colonic wall thickening or inflammatory changes. Vascular/Lymphatic: No evidence of abdominal aortic aneurysm. Atherosclerotic calcifications of the abdominal aorta and branch vessels. 15 mm short axis left para-aortic node (series 2/image 30), unchanged, previously 16 mm. 3.1 cm short axis right para-aortic/aortocaval nodal mass (series 2/image 42), unchanged, previously 3.0 cm. Reproductive: Status post hysterectomy and bilateral salpingo oophorectomy. Other: No abdominopelvic ascites. No frank omental caking/peritoneal studding. Musculoskeletal: Degenerative changes of the visualized thoracolumbar spine. IMPRESSION: Status post hysterectomy and bilateral salpingo oophorectomy. Retroperitoneal nodal metastases, grossly unchanged. Associated moderate right hydroureteronephrosis on the basis of extrinsic compression of the proximal right ureter, unchanged. Electronically Signed   By: SJulian HyM.D.   On: 09/24/2020 03:07    Procedures Procedures   Medications Ordered in ED Medications  morphine  2 MG/ML injection 2 mg (2 mg Intravenous Given 09/24/20 0213)  ondansetron (ZOFRAN) injection 4 mg (4 mg Intravenous Given 09/24/20 0212)  iohexol (OMNIPAQUE) 350 MG/ML injection 75 mL (75 mLs Intravenous Contrast Given 09/24/20 0248)    ED Course  I have reviewed the triage vital signs and the nursing notes.  Pertinent labs & imaging  results that were available during my care of the patient were reviewed by me and considered in my medical decision making (see chart for details).    MDM Rules/Calculators/A&P                           Patient presents to the emergency department for evaluation of right-sided pain.  Patient has a history of ovarian cancer.  She has been having intermittent pains for months.  Pain worsened tonight.  Examination was unremarkable.  She appears well.  Blood work unremarkable.  Urinalysis without signs of infection.  CT scan performed.  Patient does have hydroureteronephrosis on the right side.  This was seen previously.  It is from extrinsic compression, unchanged from prior.  No acute process noted.  Patient provided analgesia.   Final Clinical Impression(s) / ED Diagnoses Final diagnoses:  Flank pain  Hydroureteronephrosis    Rx / DC Orders ED Discharge Orders          Ordered    morphine (MSIR) 15 MG tablet  Every 6 hours PRN        09/24/20 0356             Orpah Greek, MD 09/24/20 [phone]

## 2020-09-24 NOTE — ED Notes (Signed)
Patient transported to CT 

## 2020-09-24 NOTE — ED Triage Notes (Signed)
Pt c/o right sided flank pain that has started radiating to her groin.

## 2020-09-28 ENCOUNTER — Telehealth: Payer: Self-pay | Admitting: Gastroenterology

## 2020-09-28 NOTE — Telephone Encounter (Signed)
Patient called said she was put on a regimen and she has had pasty diarrhea since she started the medication. Seeking advise.

## 2020-09-28 NOTE — Telephone Encounter (Signed)
Spoke with patient in regards to her concerns, she states that on the 14th she developed diarrhea. She describes as "thick and pasty diarrhea". Pt reports that she has been having incontinence, she reports that she is not having to bear down to have a bowel movement, the diarrhea is just seeping out. Pt states that she has messed up her clothes and having to wear a pad. Pt reports following recommendations in regards to Benefiber and Miralax. Advised patient to hold Miralax, it's possible that she has overflow diarrhea. Pt states that she discontinued Miralax on the 14th but had taken 4 Imodium and 1 Imodium on the 15th. Advised patient on how to complete a bowel purge, advised patient to complete this when she has nowhere to go. Advised patient to maintain adequate hydration. Advised that if she does not have any relief by Tuesday to call us back. Pt verbalized understanding and had no concerns at the end of the call.

## 2020-10-02 ENCOUNTER — Other Ambulatory Visit: Payer: Self-pay

## 2020-10-02 MED ORDER — METAMUCIL 0.52 G PO CAPS
0.5200 g | ORAL_CAPSULE | Freq: Every day | ORAL | Status: DC
Start: 2020-10-02 — End: 2021-02-22

## 2020-11-13 ENCOUNTER — Other Ambulatory Visit (HOSPITAL_BASED_OUTPATIENT_CLINIC_OR_DEPARTMENT_OTHER): Payer: Self-pay

## 2020-11-13 ENCOUNTER — Other Ambulatory Visit (HOSPITAL_COMMUNITY): Payer: Self-pay

## 2020-11-19 ENCOUNTER — Encounter: Payer: Self-pay | Admitting: Gynecologic Oncology

## 2020-11-21 ENCOUNTER — Encounter: Payer: Self-pay | Admitting: Gynecologic Oncology

## 2020-11-21 ENCOUNTER — Inpatient Hospital Stay: Payer: Medicare Other | Attending: Gynecologic Oncology | Admitting: Gynecologic Oncology

## 2020-11-21 ENCOUNTER — Other Ambulatory Visit: Payer: Self-pay

## 2020-11-21 VITALS — BP 152/61 | HR 85 | Temp 97.7°F | Resp 18 | Ht 63.0 in | Wt 194.0 lb

## 2020-11-21 DIAGNOSIS — C563 Malignant neoplasm of bilateral ovaries: Secondary | ICD-10-CM | POA: Diagnosis not present

## 2020-11-21 DIAGNOSIS — F419 Anxiety disorder, unspecified: Secondary | ICD-10-CM | POA: Insufficient documentation

## 2020-11-21 DIAGNOSIS — Z79899 Other long term (current) drug therapy: Secondary | ICD-10-CM | POA: Insufficient documentation

## 2020-11-21 DIAGNOSIS — N1330 Unspecified hydronephrosis: Secondary | ICD-10-CM | POA: Insufficient documentation

## 2020-11-21 DIAGNOSIS — E785 Hyperlipidemia, unspecified: Secondary | ICD-10-CM | POA: Insufficient documentation

## 2020-11-21 DIAGNOSIS — Z8616 Personal history of COVID-19: Secondary | ICD-10-CM | POA: Insufficient documentation

## 2020-11-21 DIAGNOSIS — R350 Frequency of micturition: Secondary | ICD-10-CM | POA: Diagnosis not present

## 2020-11-21 DIAGNOSIS — E119 Type 2 diabetes mellitus without complications: Secondary | ICD-10-CM | POA: Diagnosis not present

## 2020-11-21 DIAGNOSIS — C775 Secondary and unspecified malignant neoplasm of intrapelvic lymph nodes: Secondary | ICD-10-CM | POA: Diagnosis not present

## 2020-11-21 DIAGNOSIS — K582 Mixed irritable bowel syndrome: Secondary | ICD-10-CM | POA: Insufficient documentation

## 2020-11-21 DIAGNOSIS — M1990 Unspecified osteoarthritis, unspecified site: Secondary | ICD-10-CM | POA: Diagnosis not present

## 2020-11-21 DIAGNOSIS — G893 Neoplasm related pain (acute) (chronic): Secondary | ICD-10-CM | POA: Insufficient documentation

## 2020-11-21 DIAGNOSIS — R011 Cardiac murmur, unspecified: Secondary | ICD-10-CM | POA: Diagnosis not present

## 2020-11-21 DIAGNOSIS — C569 Malignant neoplasm of unspecified ovary: Secondary | ICD-10-CM

## 2020-11-21 DIAGNOSIS — I10 Essential (primary) hypertension: Secondary | ICD-10-CM | POA: Insufficient documentation

## 2020-11-21 NOTE — Progress Notes (Signed)
Gynecologic Oncology Return Clinic Visit  11/21/2020  Reason for Visit: Surveillance visit in the setting of ovarian cancer  Treatment History: Oncology History  Malignant neoplasm of both ovaries  06/20/2019 Initial Diagnosis   Primary ovarian adenocarcinoma, unspecified laterality ([place])   07/01/2019 Genetic Testing   Foundation One     07/11/2019 Genetic Testing   Negative genetic testing:  No pathogenic variants detected on the [facility] Multi-Cancer Panel. The report date is 07/11/2019.  The Multi-Cancer Panel offered by [facility] includes sequencing and/or deletion duplication testing of the following 85 genes: AIP, ALK, APC, ATM, AXIN2,BAP1,  BARD1, BLM, BMPR1A, BRCA1, BRCA2, BRIP1, CASR, CDC73, CDH1, CDK4, CDKN1B, CDKN1C, CDKN2A (p14ARF), CDKN2A (p16INK4a), CEBPA, CHEK2, CTNNA1, DICER1, DIS3L2, EGFR (c.2369C>T, p.Thr790Met variant only), EPCAM (Deletion/duplication testing only), FH, FLCN, GATA2, GPC3, GREM1 (Promoter region deletion/duplication testing only), HOXB13 (c.251G>A, p.Gly84Glu), HRAS, KIT, MAX, MEN1, MET, MITF (c.952G>A, p.Glu318Lys variant only), MLH1, MSH2, MSH3, MSH6, MUTYH, NBN, NF1, NF2, NTHL1, PALB2, PDGFRA, PHOX2B, PMS2, POLD1, POLE, POT1, PRKAR1A, PTCH1, PTEN, RAD50, RAD51C, RAD51D, RB1, RECQL4, RET, RNF43, RUNX1, SDHAF2, SDHA (sequence changes only), SDHB, SDHC, SDHD, SMAD4, SMARCA4, SMARCB1, SMARCE1, STK11, SUFU, TERC, TERT, TMEM127, TP53, TSC1, TSC2, VHL, WRN and WT1.   07/28/2019 -  Chemotherapy   The patient had palonosetron (ALOXI) injection 0.25 mg, 0.25 mg, Intravenous,  Once, 6 of 7 cycles Administration: 0.25 mg (07/28/2019), 0.25 mg (08/18/2019), 0.25 mg (09/15/2019), 0.25 mg (10/06/2019), 0.25 mg (10/27/2019), 0.25 mg (11/17/2019) pegfilgrastim (NEULASTA ONPRO KIT) injection 6 mg, 6 mg, Subcutaneous, Once, 6 of 7 cycles Administration: 6 mg (07/28/2019), 6 mg (08/18/2019), 6 mg (09/15/2019), 6 mg (10/06/2019), 6 mg (10/27/2019), 6 mg (11/17/2019) CARBOplatin (PARAPLATIN) 470  mg in sodium chloride 0.9 % 250 mL chemo infusion, 470 mg (100 % of original dose 470 mg), Intravenous,  Once, 6 of 7 cycles Dose modification:   (original dose 470 mg, Cycle 1, Reason: Provider Judgment),   (original dose 568.2 mg, Cycle 2),   (original dose 460.5 mg, Cycle 3), 460 mg (original dose 460 mg, Cycle 4, Reason: Provider Judgment),   (original dose 473.5 mg, Cycle 5, Reason: Provider Judgment), 460 mg (original dose 460 mg, Cycle 6) Administration: 470 mg (07/28/2019), 570 mg (08/18/2019), 460 mg (09/15/2019), 460 mg (10/06/2019), 460 mg (10/27/2019), 460 mg (11/17/2019) fosaprepitant (EMEND) 150 mg in sodium chloride 0.9 % 145 mL IVPB, 150 mg, Intravenous,  Once, 6 of 7 cycles Administration: 150 mg (07/28/2019), 150 mg (08/18/2019), 150 mg (09/15/2019), 150 mg (10/06/2019), 150 mg (10/27/2019), 150 mg (11/17/2019) PACLitaxel (TAXOL) 282 mg in sodium chloride 0.9 % 250 mL chemo infusion (> 33m/m2), 140 mg/m2 = 282 mg (80 % of original dose 175 mg/m2), Intravenous,  Once, 6 of 7 cycles Dose modification: 140 mg/m2 (80 % of original dose 175 mg/m2, Cycle 1, Reason: Provider Judgment), 150 mg/m2 (original dose 175 mg/m2, Cycle 2, Reason: Provider Judgment), 140 mg/m2 (original dose 175 mg/m2, Cycle 3, Reason: Provider Judgment), 140 mg/m2 (original dose 175 mg/m2, Cycle 4, Reason: Other (see comments), Comment: fatigue) Administration: 282 mg (07/28/2019), 300 mg (08/18/2019), 282 mg (09/15/2019), 282 mg (10/06/2019), 282 mg (10/27/2019), 282 mg (11/17/2019)   for chemotherapy treatment.       Interval History: Patient saw Dr. KDelton Coombeslast in mid August.  In the setting of disease recurrence (in para-aortic nodal beds) options for treatment were discussed with the patient including radiation, chemotherapy, or close follow-up.  She was asymptomatic at that time and wished to proceed with close interval follow-up.  Recent  visit to ED in September for right-sided back and abdominal pain. She had a CT scan  that noted unchanged moderate right hydroureteronephrosis. Stable size of bilateral para-aortic lymph nodes.  Creatinine was normal at that visit.  Today, the patient reports overall doing well.  She continues to have some right low back pain along her spine that intermittently wraps around to the front of her pelvis.  This was pain that she started having after surgery and describes it as feeling like menstrual cramps.  She has not had this as frequently the last week or 2.  When she has the pain, it is mild during the daytime and becomes worse at bedtime, causing her to have trouble sleeping at times.  She has some increased urinary frequency and notes clear urine but overall slower flow than normal.  She will get up some nights 5 or 6 times a night to urinate secondary to pressure that she feels in her bladder.  She endorses a good appetite without nausea or emesis.  She has IBS and alternates between constipation and diarrhea.  She is currently trying to modify her diet to see if she can improve her symptoms.  She continues to have a cough which she has had since COVID infection in June.  Still feels like her energy is recovering from that.  Past Medical/Surgical History: Past Medical History:  Diagnosis Date   Anemia    Anxiety    Arthritis    back of neck, bones spurs on neck   Cancer (HCC)    ovarian cancer   Cataract    Cervical disc disease    Diabetes mellitus    Family history of thyroid cancer    Heart murmur    Hydronephrosis 08/24/2020   Right Kidney (stable per CT)   Hyperlipidemia    Hypertension    Mucoid cyst of joint    right thumb   Neuropathy    Port-A-Cath in place 07/21/2019   Reflux    Sleep apnea    wears CPAP nightly   Vertigo     Past Surgical History:  Procedure Laterality Date   ABDOMINAL HYSTERECTOMY     vaginal   BLADDER SURGERY     BREAST EXCISIONAL BIOPSY Bilateral    BREAST SURGERY     CHOLECYSTECTOMY     COLONOSCOPY     fibroids removed      breast (both breasts)   IR IMAGING GUIDED PORT INSERTION  07/26/2019   right   MASS EXCISION Right 06/26/2016   Procedure: EXCISION MUCOID TUMOR RIGHT THUMB, IP RIGHT THUMB;  Surgeon: Daryll Brod, MD;  Location: [place];  Service: Orthopedics;  Laterality: Right;   PARTIAL HYSTERECTOMY     ROBOTIC ASSISTED BILATERAL SALPINGO OOPHERECTOMY N/A 01/03/2020   Procedure: XI ROBOTIC ASSISTED BILATERAL SALPINGO OOPHORECTOMY, RADICAL TUMOR DEBULKING;  Surgeon: Everitt Amber, MD;  Location: [facility];  Service: Gynecology;  Laterality: N/A;   ROBOTIC PELVIC AND PARA-AORTIC LYMPH NODE DISSECTION N/A 01/03/2020   Procedure: XI ROBOTIC PARA-AORTIC LYMPHADENECTOMY;  Surgeon: Everitt Amber, MD;  Location: [facility];  Service: Gynecology;  Laterality: N/A;    Family History  Problem Relation Age of Onset   Hypertension Mother    Diabetes Father    Hypertension Father    Diabetes Sister    Breast cancer Sister        stage 0   Hypertension Sister    Stroke Sister    Diabetes Maternal Aunt    Thyroid cancer  Daughter 17   Hypertension Other    Colon cancer Neg Hx    Esophageal cancer Neg Hx    Stomach cancer Neg Hx    Rectal cancer Neg Hx    Endometrial cancer Neg Hx    Ovarian cancer Neg Hx     Social History   Socioeconomic History   Marital status: Divorced    Spouse name: Not on file   Number of children: 4   Years of education: 16   Highest education level: Not on file  Occupational History   Occupation: retired Geographical information systems officer  Tobacco Use   Smoking status: Never   Smokeless tobacco: Never  Vaping Use   Vaping Use: Never used  Substance and Sexual Activity   Alcohol use: No    Alcohol/week: 0.0 standard drinks   Drug use: No   Sexual activity: Not Currently  Other Topics Concern   Not on file  Social History Narrative   Not on file   Social Determinants of Health   Financial Resource Strain: Low Risk    Difficulty of Paying Living Expenses: Not hard  at all  Food Insecurity: No Food Insecurity   Worried About Charity fundraiser in the Last Year: Never true   [place] in the Last Year: Never true  Transportation Needs: No Transportation Needs   Lack of Transportation (Medical): No   Lack of Transportation (Non-Medical): No  Physical Activity: Insufficiently Active   Days of Exercise per Week: 2 days   Minutes of Exercise per Session: 60 min  Stress: No Stress Concern Present   Feeling of Stress : Not at all  Social Connections: Moderately Integrated   Frequency of Communication with Friends and Family: More than three times a week   Frequency of Social Gatherings with Friends and Family: Once a week   Attends Religious Services: 1 to 4 times per year   Active Member of [employer] or Organizations: No   Attends Music therapist: 1 to 4 times per year   Marital Status: Divorced    Current Medications:  Current Outpatient Medications:    acetaminophen (TYLENOL) 500 MG tablet, Take 1 tablet (500 mg total) by mouth every 6 (six) hours as needed., Disp: 30 tablet, Rfl: 0   amLODipine (NORVASC) 5 MG tablet, TAKE 1 TABLET (5 MG TOTAL) BY MOUTH DAILY., Disp: 90 tablet, Rfl: 3   Blood Glucose Monitoring Suppl (ONE TOUCH ULTRA 2) w/Device KIT, Use as directed, Disp: 1 each, Rfl: 0   Cholecalciferol (VITAMIN D3) 125 MCG (5000 UT) TABS, Take 10,000-15,000 Units by mouth daily., Disp: , Rfl:    famotidine (PEPCID) 20 MG tablet, Take 20 mg by mouth 2 (two) times daily as needed for heartburn or indigestion., Disp: , Rfl:    feeding supplement (BOOST HIGH PROTEIN) LIQD, Take 237 mLs by mouth daily., Disp: , Rfl:    glimepiride (AMARYL) 2 MG tablet, Take 1 tablet (2 mg total) by mouth daily before breakfast., Disp: 90 tablet, Rfl: 3   irbesartan (AVAPRO) 75 MG tablet, Take 1 tablet (75 mg total) by mouth daily., Disp: 90 tablet, Rfl: 3   Lancets (ONETOUCH ULTRASOFT) lancets, USE TO CHECK BLOOD SUGAR TWICE DAILY, Disp: 100 each, Rfl:  3   loperamide (IMODIUM) 2 MG capsule, Take 2 mg by mouth as needed for diarrhea or loose stools. , Disp: , Rfl:    loratadine (CLARITIN) 10 MG tablet, Take 10 mg by mouth daily as needed (allergies.). ,  Disp: , Rfl:    Multiple Vitamins-Minerals (AIRBORNE PO), Take by mouth., Disp: , Rfl:    ONETOUCH ULTRA test strip, USE TO CHECK BLOOD SUGARS TWO TIMES DAILY, Disp: 100 strip, Rfl: 3   OVER THE COUNTER MEDICATION, Apply 1 application topically daily as needed (pain). Triderma otc pain cream, Disp: , Rfl:    polyethylene glycol (MIRALAX MIX-IN PAX) 17 g packet, , Disp: , Rfl:    psyllium (METAMUCIL) 0.52 g capsule, Take 1 capsule (0.52 g total) by mouth daily., Disp: , Rfl:    vitamin B-12 (CYANOCOBALAMIN) 1000 MCG tablet, Take 1,000 mcg by mouth daily. , Disp: , Rfl:    Camphor-Eucalyptus-Menthol (VICKS VAPORUB EX), Apply 1 application topically. (Patient not taking: Reported on 11/19/2020), Disp: , Rfl:    LORazepam (ATIVAN) 0.5 MG tablet, Take 1 tablet (0.5 mg total) by mouth 2 (two) times daily as needed for anxiety. (Patient not taking: Reported on 11/19/2020), Disp: 60 tablet, Rfl: 1   morphine (MSIR) 15 MG tablet, Take 1 tablet (15 mg total) by mouth every 6 (six) hours as needed for severe pain. (Patient not taking: Reported on 11/19/2020), Disp: 10 tablet, Rfl: 0   prochlorperazine (COMPAZINE) 10 MG tablet, Take 10 mg by mouth every 6 (six) hours as needed for nausea or vomiting. (Patient not taking: Reported on 11/19/2020), Disp: , Rfl:   Review of Systems: Pertinent positives include mild cough, constipation, urinary frequency, pelvic pain, hot flashes, joint pain. Denies appetite changes, fevers, chills, fatigue, unexplained weight changes. Denies hearing loss, neck lumps or masses, mouth sores, ringing in ears or voice changes. Denies wheezing.  Denies shortness of breath. Denies chest pain or palpitations. Denies leg swelling. Denies abdominal distention, pain, blood in stools,  diarrhea, nausea, vomiting, or early satiety. Denies pain with intercourse, dysuria, hematuria or incontinence. Denies vaginal bleeding or vaginal discharge.   Denies back pain or muscle pain/cramps. Denies itching, rash, or wounds. Denies dizziness, headaches, numbness or seizures. Denies swollen lymph nodes or glands, denies easy bruising or bleeding. Denies anxiety, depression, confusion, or decreased concentration.  Physical Exam: BP (!) 152/61 (BP Location: Left Arm, Patient Position: Sitting)   Pulse 85   Temp 97.7 F (36.5 C) (Tympanic)   Resp 18   Ht 5' 3"  (1.6 m)   Wt 194 lb (88 kg)   SpO2 97%   BMI 34.37 kg/m  General: Alert, oriented, no acute distress. HEENT: Normocephalic, atraumatic, sclera anicteric. Chest: Clear to auscultation bilaterally.  Unlabored breathing on room air. Cardiovascular: Regular rate and rhythm, no murmurs. Abdomen: Obese, soft, nontender.  Normoactive bowel sounds.  No masses or hepatosplenomegaly appreciated.  Well-healed incisions. Extremities: Grossly normal range of motion.  Warm, well perfused.  No edema bilaterally. Skin: No rashes or lesions noted. Lymphatics: No cervical, supraclavicular, or inguinal adenopathy. GU: Normal appearing external genitalia without erythema, excoriation, or lesions.  Speculum exam reveals mildly atrophic vaginal mucosa, no masses or lesions noted.  Bimanual exam reveals no nodularity or masses.  Rectovaginal exam confirms these findings.  Laboratory & Radiologic Studies: None new  Assessment & Plan: Laura [name] is a 75 y.o. 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 This was treated with neoadjuvant chemotherapy, surgery in December, 2021 showing complete pathologic response. Carboplatin and paclitaxel x 6 completed on 11/17/19.   Elevation in CA 125 concerning for recurrence.  CT suggests location of recurrence is the  para-aortic region. Last CA-125 had decreased  again.   Patient is overall relatively asymptomatic. She continues to have some right low back and pelvic pain, presents since surgery. Recent CT in September showed no significant change in lymph node metastatic disease. She is scheduled for follow-up imaging later this month.   We discussed options for treatment again that were offered at her last visit. She continues to decline interest in radiation treatment. I've asked her to think about whether she would accept systemic therapy if CT were to show progression.   In terms of her difficulty with sleeping, we discussed strategies to help with this including decreasing or limiting liquid intake later at night since she gets up frequently to go to the bathroom.  I also suggested that she try melatonin or Tylenol, as it seems to be that sometimes her back pain is what keeps her up.  I will see her back in 3 months.   38 minutes of total time was spent for this patient encounter, including preparation, face-to-face counseling with the patient and coordination of care, and documentation of the encounter.  Jeral Pinch, MD  Division of Gynecologic Oncology  Department of Obstetrics and Gynecology  [facility]

## 2020-11-21 NOTE — Patient Instructions (Addendum)
It was a pleasure meeting you today.  I will watch for your CT scan later this month.  Please think about whether you would be interested in starting treatment if things seem to have grown on your upcoming CT scan  I will see you in 3 months, although we will move that visit if you restart treatment for after treatment.  In terms of your sleeping, try to limit how much fluid you are drinking later at night so that you don't have to get up to go to the bathroom as much.  I would also recommend trying melatonin or taking a Tylenol PM so that you do not wake up from your back pain.

## 2020-11-22 ENCOUNTER — Other Ambulatory Visit (HOSPITAL_COMMUNITY): Payer: Self-pay

## 2020-11-26 ENCOUNTER — Other Ambulatory Visit (HOSPITAL_BASED_OUTPATIENT_CLINIC_OR_DEPARTMENT_OTHER): Payer: Self-pay

## 2020-11-28 ENCOUNTER — Other Ambulatory Visit: Payer: Self-pay | Admitting: Internal Medicine

## 2020-11-28 MED ORDER — ESZOPICLONE 3 MG PO TABS
3.0000 mg | ORAL_TABLET | Freq: Every day | ORAL | 1 refills | Status: DC
  Filled 2020-11-28: qty 90, 90d supply, fill #0

## 2020-11-29 ENCOUNTER — Other Ambulatory Visit (HOSPITAL_BASED_OUTPATIENT_CLINIC_OR_DEPARTMENT_OTHER): Payer: Self-pay

## 2020-11-30 ENCOUNTER — Ambulatory Visit (INDEPENDENT_AMBULATORY_CARE_PROVIDER_SITE_OTHER): Payer: Medicare Other

## 2020-11-30 ENCOUNTER — Other Ambulatory Visit: Payer: Self-pay

## 2020-11-30 DIAGNOSIS — Z23 Encounter for immunization: Secondary | ICD-10-CM

## 2020-11-30 NOTE — Progress Notes (Signed)
Pt was HD flu vacc w/o any complications.

## 2020-12-04 ENCOUNTER — Other Ambulatory Visit (HOSPITAL_COMMUNITY): Payer: Medicare Other

## 2020-12-04 ENCOUNTER — Other Ambulatory Visit: Payer: Self-pay

## 2020-12-04 ENCOUNTER — Ambulatory Visit (HOSPITAL_COMMUNITY)
Admission: RE | Admit: 2020-12-04 | Discharge: 2020-12-04 | Disposition: A | Discharge status: Home / Self Care | Payer: Medicare Other | Source: Ambulatory Visit | Attending: Hematology | Admitting: Hematology

## 2020-12-04 ENCOUNTER — Inpatient Hospital Stay (HOSPITAL_COMMUNITY): Payer: Medicare Other | Attending: Hematology

## 2020-12-04 DIAGNOSIS — K449 Diaphragmatic hernia without obstruction or gangrene: Secondary | ICD-10-CM | POA: Diagnosis not present

## 2020-12-04 DIAGNOSIS — N1330 Unspecified hydronephrosis: Secondary | ICD-10-CM | POA: Insufficient documentation

## 2020-12-04 DIAGNOSIS — C569 Malignant neoplasm of unspecified ovary: Secondary | ICD-10-CM

## 2020-12-04 DIAGNOSIS — Z79899 Other long term (current) drug therapy: Secondary | ICD-10-CM | POA: Diagnosis not present

## 2020-12-04 DIAGNOSIS — I10 Essential (primary) hypertension: Secondary | ICD-10-CM | POA: Insufficient documentation

## 2020-12-04 DIAGNOSIS — K5900 Constipation, unspecified: Secondary | ICD-10-CM | POA: Diagnosis not present

## 2020-12-04 DIAGNOSIS — Z90721 Acquired absence of ovaries, unilateral: Secondary | ICD-10-CM | POA: Insufficient documentation

## 2020-12-04 DIAGNOSIS — I700 Atherosclerosis of aorta: Secondary | ICD-10-CM | POA: Insufficient documentation

## 2020-12-04 DIAGNOSIS — Z9221 Personal history of antineoplastic chemotherapy: Secondary | ICD-10-CM | POA: Diagnosis not present

## 2020-12-04 DIAGNOSIS — M545 Low back pain, unspecified: Secondary | ICD-10-CM | POA: Insufficient documentation

## 2020-12-04 DIAGNOSIS — R590 Localized enlarged lymph nodes: Secondary | ICD-10-CM | POA: Insufficient documentation

## 2020-12-04 DIAGNOSIS — C561 Malignant neoplasm of right ovary: Secondary | ICD-10-CM | POA: Insufficient documentation

## 2020-12-04 DIAGNOSIS — N8110 Cystocele, unspecified: Secondary | ICD-10-CM | POA: Diagnosis not present

## 2020-12-04 DIAGNOSIS — C772 Secondary and unspecified malignant neoplasm of intra-abdominal lymph nodes: Secondary | ICD-10-CM | POA: Diagnosis not present

## 2020-12-04 DIAGNOSIS — Z90710 Acquired absence of both cervix and uterus: Secondary | ICD-10-CM | POA: Diagnosis not present

## 2020-12-04 DIAGNOSIS — Z9079 Acquired absence of other genital organ(s): Secondary | ICD-10-CM | POA: Diagnosis not present

## 2020-12-04 LAB — CBC WITH DIFFERENTIAL/PLATELET
Abs Immature Granulocytes: 0.03 10*3/uL (ref 0.00–0.07)
Basophils Absolute: 0 10*3/uL (ref 0.0–0.1)
Basophils Relative: 0 %
Eosinophils Absolute: 0.1 10*3/uL (ref 0.0–0.5)
Eosinophils Relative: 2 %
HCT: 36.2 % (ref 36.0–46.0)
Hemoglobin: 12.1 g/dL (ref 12.0–15.0)
Immature Granulocytes: 0 %
Lymphocytes Relative: 30 %
Lymphs Abs: 2.5 10*3/uL (ref 0.7–4.0)
MCH: 30 pg (ref 26.0–34.0)
MCHC: 33.4 g/dL (ref 30.0–36.0)
MCV: 89.8 fL (ref 80.0–100.0)
Monocytes Absolute: 0.6 10*3/uL (ref 0.1–1.0)
Monocytes Relative: 7 %
Neutro Abs: 5.2 10*3/uL (ref 1.7–7.7)
Neutrophils Relative %: 61 %
Platelets: 208 10*3/uL (ref 150–400)
RBC: 4.03 MIL/uL (ref 3.87–5.11)
RDW: 13.2 % (ref 11.5–15.5)
WBC: 8.5 10*3/uL (ref 4.0–10.5)
nRBC: 0 % (ref 0.0–0.2)

## 2020-12-04 LAB — COMPREHENSIVE METABOLIC PANEL
ALT: 14 U/L (ref 0–44)
AST: 18 U/L (ref 15–41)
Albumin: 4.3 g/dL (ref 3.5–5.0)
Alkaline Phosphatase: 71 U/L (ref 38–126)
Anion gap: 7 (ref 5–15)
BUN: 20 mg/dL (ref 8–23)
CO2: 25 mmol/L (ref 22–32)
Calcium: 9.9 mg/dL (ref 8.9–10.3)
Chloride: 106 mmol/L (ref 98–111)
Creatinine, Ser: 0.88 mg/dL (ref 0.44–1.00)
GFR, Estimated: >60 mL/min (ref >60)
Glucose, Bld: 97 mg/dL (ref 70–99)
Potassium: 4.1 mmol/L (ref 3.5–5.1)
Sodium: 138 mmol/L (ref 135–145)
Total Bilirubin: 0.5 mg/dL (ref 0.3–1.2)
Total Protein: 7.3 g/dL (ref 6.5–8.1)

## 2020-12-04 MED ORDER — IOHEXOL 300 MG/ML  SOLN
100.0000 mL | Freq: Once | INTRAMUSCULAR | Status: AC | PRN
  Administered 2020-12-04: 100 mL via INTRAVENOUS

## 2020-12-05 LAB — CA 125: Cancer Antigen (CA) 125: 20.6 U/mL (ref 0.0–38.1)

## 2020-12-10 NOTE — Progress Notes (Signed)
[facility] [place], [place] [address]   CLINIC:  Medical Oncology/Hematology  PCP:  Biagio Borg, MD [place] / [place] [address] [phone]   REASON FOR VISIT:  Follow-up for ovarian cancer  PRIOR THERAPY:  1. Carboplatin, paclitaxel and Aloxi x 6 cycles from 07/28/2019 to 11/17/2019. 2. Robotic assisted BSO with tumor debulking on 01/03/2020.   NGS Results: Foundation 1 MS--stable, TMB 0 Muts/Mb  CURRENT THERAPY: surveillance  BRIEF ONCOLOGIC HISTORY:  Oncology History  Malignant neoplasm of both ovaries  06/20/2019 Initial Diagnosis   Primary ovarian adenocarcinoma, unspecified laterality ([facility])   07/01/2019 Genetic Testing   Foundation One     07/11/2019 Genetic Testing   Negative genetic testing:  No pathogenic variants detected on the [facility] Multi-Cancer Panel. The report date is 07/11/2019.  The Multi-Cancer Panel offered by [facility] includes sequencing and/or deletion duplication testing of the following 85 genes: AIP, ALK, APC, ATM, AXIN2,BAP1,  BARD1, BLM, BMPR1A, BRCA1, BRCA2, BRIP1, CASR, CDC73, CDH1, CDK4, CDKN1B, CDKN1C, CDKN2A (p14ARF), CDKN2A (p16INK4a), CEBPA, CHEK2, CTNNA1, DICER1, DIS3L2, EGFR (c.2369C>T, p.Thr790Met variant only), EPCAM (Deletion/duplication testing only), FH, FLCN, GATA2, GPC3, GREM1 (Promoter region deletion/duplication testing only), HOXB13 (c.251G>A, p.Gly84Glu), HRAS, KIT, MAX, MEN1, MET, MITF (c.952G>A, p.Glu318Lys variant only), MLH1, MSH2, MSH3, MSH6, MUTYH, NBN, NF1, NF2, NTHL1, PALB2, PDGFRA, PHOX2B, PMS2, POLD1, POLE, POT1, PRKAR1A, PTCH1, PTEN, RAD50, RAD51C, RAD51D, RB1, RECQL4, RET, RNF43, RUNX1, SDHAF2, SDHA (sequence changes only), SDHB, SDHC, SDHD, SMAD4, SMARCA4, SMARCB1, SMARCE1, STK11, SUFU, TERC, TERT, TMEM127, TP53, TSC1, TSC2, VHL, WRN and WT1.   07/28/2019 -  Chemotherapy   The patient had palonosetron (ALOXI) injection 0.25 mg, 0.25 mg, Intravenous,  Once, 6 of 7  cycles Administration: 0.25 mg (07/28/2019), 0.25 mg (08/18/2019), 0.25 mg (09/15/2019), 0.25 mg (10/06/2019), 0.25 mg (10/27/2019), 0.25 mg (11/17/2019) pegfilgrastim (NEULASTA ONPRO KIT) injection 6 mg, 6 mg, Subcutaneous, Once, 6 of 7 cycles Administration: 6 mg (07/28/2019), 6 mg (08/18/2019), 6 mg (09/15/2019), 6 mg (10/06/2019), 6 mg (10/27/2019), 6 mg (11/17/2019) CARBOplatin (PARAPLATIN) 470 mg in sodium chloride 0.9 % 250 mL chemo infusion, 470 mg (100 % of original dose 470 mg), Intravenous,  Once, 6 of 7 cycles Dose modification:   (original dose 470 mg, Cycle 1, Reason: Provider Judgment),   (original dose 568.2 mg, Cycle 2),   (original dose 460.5 mg, Cycle 3), 460 mg (original dose 460 mg, Cycle 4, Reason: Provider Judgment),   (original dose 473.5 mg, Cycle 5, Reason: Provider Judgment), 460 mg (original dose 460 mg, Cycle 6) Administration: 470 mg (07/28/2019), 570 mg (08/18/2019), 460 mg (09/15/2019), 460 mg (10/06/2019), 460 mg (10/27/2019), 460 mg (11/17/2019) fosaprepitant (EMEND) 150 mg in sodium chloride 0.9 % 145 mL IVPB, 150 mg, Intravenous,  Once, 6 of 7 cycles Administration: 150 mg (07/28/2019), 150 mg (08/18/2019), 150 mg (09/15/2019), 150 mg (10/06/2019), 150 mg (10/27/2019), 150 mg (11/17/2019) PACLitaxel (TAXOL) 282 mg in sodium chloride 0.9 % 250 mL chemo infusion (> 71m/m2), 140 mg/m2 = 282 mg (80 % of original dose 175 mg/m2), Intravenous,  Once, 6 of 7 cycles Dose modification: 140 mg/m2 (80 % of original dose 175 mg/m2, Cycle 1, Reason: Provider Judgment), 150 mg/m2 (original dose 175 mg/m2, Cycle 2, Reason: Provider Judgment), 140 mg/m2 (original dose 175 mg/m2, Cycle 3, Reason: Provider Judgment), 140 mg/m2 (original dose 175 mg/m2, Cycle 4, Reason: Other (see comments), Comment: fatigue) Administration: 282 mg (07/28/2019), 300 mg (08/18/2019), 282 mg (09/15/2019), 282 mg (10/06/2019), 282 mg (10/27/2019),  282 mg (11/17/2019)   for chemotherapy treatment.       CANCER STAGING: Cancer Staging   No matching staging information was found for the patient.  INTERVAL HISTORY:  Ms. TONJI ELLIFF, a 75 y.o. female, returns for routine follow-up of her ovarian cancer. Eutha was last seen on 08/30/2020.   Today she reports feeling well. She presented to the ED on 09/12 for right-sided abdominal pain which has since resolved. She reports poor sleep and constipation, diarrhea, and nausea which she associates with her history of IBS. She reports fatigue.   REVIEW OF SYSTEMS:  Review of Systems  Constitutional:  Positive for appetite change (25%) and fatigue (50%).  Gastrointestinal:  Positive for constipation, diarrhea and nausea. Negative for abdominal pain (R side pain currently resolved).  Genitourinary:  Positive for bladder incontinence and frequency.   Musculoskeletal:  Positive for neck pain (4/10).  Psychiatric/Behavioral:  Positive for sleep disturbance.   All other systems reviewed and are negative.  PAST MEDICAL/SURGICAL HISTORY:  Past Medical History:  Diagnosis Date   Anemia    Anxiety    Arthritis    back of neck, bones spurs on neck   Cancer (HCC)    ovarian cancer   Cataract    Cervical disc disease    Diabetes mellitus    Family history of thyroid cancer    Heart murmur    Hydronephrosis 08/24/2020   Right Kidney (stable per CT)   Hyperlipidemia    Hypertension    Mucoid cyst of joint    right thumb   Neuropathy    Port-A-Cath in place 07/21/2019   Reflux    Sleep apnea    wears CPAP nightly   Vertigo    Past Surgical History:  Procedure Laterality Date   ABDOMINAL HYSTERECTOMY     vaginal   BLADDER SURGERY     BREAST EXCISIONAL BIOPSY Bilateral    BREAST SURGERY     CHOLECYSTECTOMY     COLONOSCOPY     fibroids removed     breast (both breasts)   IR IMAGING GUIDED PORT INSERTION  07/26/2019   right   MASS EXCISION Right 06/26/2016   Procedure: EXCISION MUCOID TUMOR RIGHT THUMB, IP RIGHT THUMB;  Surgeon: Daryll Brod, MD;  Location: [facility];  Service: Orthopedics;  Laterality: Right;   PARTIAL HYSTERECTOMY     ROBOTIC ASSISTED BILATERAL SALPINGO OOPHERECTOMY N/A 01/03/2020   Procedure: XI ROBOTIC ASSISTED BILATERAL SALPINGO OOPHORECTOMY, RADICAL TUMOR DEBULKING;  Surgeon: Everitt Amber, MD;  Location: [facility];  Service: Gynecology;  Laterality: N/A;   ROBOTIC PELVIC AND PARA-AORTIC LYMPH NODE DISSECTION N/A 01/03/2020   Procedure: XI ROBOTIC PARA-AORTIC LYMPHADENECTOMY;  Surgeon: Everitt Amber, MD;  Location: [facility];  Service: Gynecology;  Laterality: N/A;    SOCIAL HISTORY:  Social History   Socioeconomic History   Marital status: Divorced    Spouse name: Not on file   Number of children: 4   Years of education: 16   Highest education level: Not on file  Occupational History   Occupation: retired Geographical information systems officer  Tobacco Use   Smoking status: Never   Smokeless tobacco: Never  Vaping Use   Vaping Use: Never used  Substance and Sexual Activity   Alcohol use: No    Alcohol/week: 0.0 standard drinks   Drug use: No   Sexual activity: Not Currently  Other Topics Concern   Not on file  Social History Narrative   Not on file  Social Determinants of Health   Financial Resource Strain: Low Risk    Difficulty of Paying Living Expenses: Not hard at all  Food Insecurity: No Food Insecurity   Worried About Charity fundraiser in the Last Year: Never true   [place] in the Last Year: Never true  Transportation Needs: No Transportation Needs   Lack of Transportation (Medical): No   Lack of Transportation (Non-Medical): No  Physical Activity: Insufficiently Active   Days of Exercise per Week: 2 days   Minutes of Exercise per Session: 60 min  Stress: No Stress Concern Present   Feeling of Stress : Not at all  Social Connections: Moderately Integrated   Frequency of Communication with Friends and Family: More than three times a week   Frequency of Social Gatherings with Friends and Family:  Once a week   Attends Religious Services: 1 to 4 times per year   Active Member of [employer] or Organizations: No   Attends Music therapist: 1 to 4 times per year   Marital Status: Divorced  Human resources officer Violence: Not At Risk   Fear of Current or Ex-Partner: No   Emotionally Abused: No   Physically Abused: No   Sexually Abused: No    FAMILY HISTORY:  Family History  Problem Relation Age of Onset   Hypertension Mother    Diabetes Father    Hypertension Father    Diabetes Sister    Breast cancer Sister        stage 0   Hypertension Sister    Stroke Sister    Diabetes Maternal Aunt    Thyroid cancer Daughter 30   Hypertension Other    Colon cancer Neg Hx    Esophageal cancer Neg Hx    Stomach cancer Neg Hx    Rectal cancer Neg Hx    Endometrial cancer Neg Hx    Ovarian cancer Neg Hx     CURRENT MEDICATIONS:  Current Outpatient Medications  Medication Sig Dispense Refill   acetaminophen (TYLENOL) 500 MG tablet Take 1 tablet (500 mg total) by mouth every 6 (six) hours as needed. 30 tablet 0   amLODipine (NORVASC) 5 MG tablet TAKE 1 TABLET (5 MG TOTAL) BY MOUTH DAILY. 90 tablet 3   Blood Glucose Monitoring Suppl (ONE TOUCH ULTRA 2) w/Device KIT Use as directed 1 each 0   Camphor-Eucalyptus-Menthol (VICKS VAPORUB EX) Apply 1 application topically. (Patient not taking: Reported on 11/19/2020)     Cholecalciferol (VITAMIN D3) 125 MCG (5000 UT) TABS Take 10,000-15,000 Units by mouth daily.     Eszopiclone 3 MG TABS Take 1 tablet (3 mg total) by mouth at bedtime. Take immediately before bedtime 90 tablet 1   famotidine (PEPCID) 20 MG tablet Take 20 mg by mouth 2 (two) times daily as needed for heartburn or indigestion.     feeding supplement (BOOST HIGH PROTEIN) LIQD Take 237 mLs by mouth daily.     glimepiride (AMARYL) 2 MG tablet Take 1 tablet (2 mg total) by mouth daily before breakfast. 90 tablet 3   irbesartan (AVAPRO) 75 MG tablet Take 1 tablet (75 mg total) by  mouth daily. 90 tablet 3   Lancets (ONETOUCH ULTRASOFT) lancets USE TO CHECK BLOOD SUGAR TWICE DAILY 100 each 3   loperamide (IMODIUM) 2 MG capsule Take 2 mg by mouth as needed for diarrhea or loose stools.      loratadine (CLARITIN) 10 MG tablet Take 10 mg by mouth daily as needed (  allergies.).      LORazepam (ATIVAN) 0.5 MG tablet Take 1 tablet (0.5 mg total) by mouth 2 (two) times daily as needed for anxiety. (Patient not taking: Reported on 11/19/2020) 60 tablet 1   morphine (MSIR) 15 MG tablet Take 1 tablet (15 mg total) by mouth every 6 (six) hours as needed for severe pain. (Patient not taking: Reported on 11/19/2020) 10 tablet 0   Multiple Vitamins-Minerals (AIRBORNE PO) Take by mouth.     ONETOUCH ULTRA test strip USE TO CHECK BLOOD SUGARS TWO TIMES DAILY 100 strip 3   OVER THE COUNTER MEDICATION Apply 1 application topically daily as needed (pain). Triderma otc pain cream     polyethylene glycol (MIRALAX MIX-IN PAX) 17 g packet      prochlorperazine (COMPAZINE) 10 MG tablet Take 10 mg by mouth every 6 (six) hours as needed for nausea or vomiting. (Patient not taking: Reported on 11/19/2020)     psyllium (METAMUCIL) 0.52 g capsule Take 1 capsule (0.52 g total) by mouth daily.     vitamin B-12 (CYANOCOBALAMIN) 1000 MCG tablet Take 1,000 mcg by mouth daily.      No current facility-administered medications for this visit.    ALLERGIES:  Allergies  Allergen Reactions   Ciprofloxacin Swelling    Torn tendon   Hydrocodone Bit-Homatrop Mbr Other (See Comments)    Vertigo *pt strongly prefers to never take*   Sulfa Antibiotics Hives, Itching and Swelling    Tongue swells   Augmentin [Amoxicillin-Pot Clavulanate]     Diarrhea; can take PCN/ Amoxicillin   Codeine Itching   Crestor [Rosuvastatin Calcium] Other (See Comments)    Did something to memory     Doxycycline Other (See Comments)    Severe rectal Gas.   Gabapentin     disoriented   Keflex [Cephalexin] Diarrhea and Nausea And  Vomiting   Naproxen Other (See Comments)    Stomach cramps   Statins     Muscle weakness   Prednisone Anxiety    *pt strongly prefers to never be given prednisone*     PHYSICAL EXAM:  Performance status (ECOG): 1 - Symptomatic but completely ambulatory  There were no vitals filed for this visit. Wt Readings from Last 3 Encounters:  11/21/20 194 lb (88 kg)  09/24/20 189 lb 9.5 oz (86 kg)  09/21/20 191 lb 6 oz (86.8 kg)   Physical Exam Vitals reviewed.  Constitutional:      Appearance: Normal appearance. She is obese.  Cardiovascular:     Rate and Rhythm: Normal rate and regular rhythm.     Pulses: Normal pulses.     Heart sounds: Normal heart sounds.  Pulmonary:     Effort: Pulmonary effort is normal.     Breath sounds: Normal breath sounds.  Neurological:     General: No focal deficit present.     Mental Status: She is alert and oriented to person, place, and time.  Psychiatric:        Mood and Affect: Mood normal.        Behavior: Behavior normal.     LABORATORY DATA:  I have reviewed the labs as listed.  CBC Latest Ref Rng & Units 12/04/2020 09/24/2020 08/24/2020  WBC 4.0 - 10.5 K/uL 8.5 7.7 8.0  Hemoglobin 12.0 - 15.0 g/dL 12.1 12.2 11.8(L)  Hematocrit 36.0 - 46.0 % 36.2 36.2 35.6(L)  Platelets 150 - 400 K/uL 208 214 202   CMP Latest Ref Rng & Units 12/04/2020 09/24/2020 08/24/2020  Glucose 70 -  99 mg/dL 97 118(H) 124(H)  BUN 8 - 23 mg/dL 20 22 19   Creatinine 0.44 - 1.00 mg/dL 0.88 0.95 0.76  Sodium 135 - 145 mmol/L 138 136 136  Potassium 3.5 - 5.1 mmol/L 4.1 4.2 4.2  Chloride 98 - 111 mmol/L 106 106 105  CO2 22 - 32 mmol/L 25 20(L) 26  Calcium 8.9 - 10.3 mg/dL 9.9 9.9 9.8  Total Protein 6.5 - 8.1 g/dL 7.3 - 7.7  Total Bilirubin 0.3 - 1.2 mg/dL 0.5 - 0.3  Alkaline Phos 38 - 126 U/L 71 - 69  AST 15 - 41 U/L 18 - 16  ALT 0 - 44 U/L 14 - 14    DIAGNOSTIC IMAGING:  I have independently reviewed the scans and discussed with the patient. CT Abdomen Pelvis W  Contrast  Result Date: 12/05/2020 CLINICAL DATA:  Follow-up ovarian carcinoma. Previous surgery and chemotherapy. EXAM: CT ABDOMEN AND PELVIS WITH CONTRAST TECHNIQUE: Multidetector CT imaging of the abdomen and pelvis was performed using the standard protocol following bolus administration of intravenous contrast. CONTRAST:  173m OMNIPAQUE IOHEXOL 300 MG/ML  SOLN COMPARISON:  09/24/2020 FINDINGS: Lower Chest: No acute findings. Hepatobiliary: No hepatic masses identified. Prior cholecystectomy. No evidence of biliary obstruction. Pancreas: Soft tissue prominence is seen in the pancreatic head which measures approximately 2.9 x 2.9 cm on image 27/9, without significant change compared to prior study Spleen: Within normal limits in size and appearance. Adrenals/Urinary Tract: No masses identified. Increased moderate right hydronephrosis is seen due to retroperitoneal lymphadenopathy which obstructs the proximal right ureter. Unremarkable unopacified urinary bladder. Stomach/Bowel: Stable moderate hiatal hernia. No evidence of obstruction, inflammatory process or abnormal fluid collections. Vascular/Lymphatic: 1.4 cm left paraaortic lymph node on image 31/2 remains stable. Retroperitoneal lymphadenopathy in the portacaval space measuring 3.5 x 5.6 cm, mildly increased from 3.1 x 5.0 cm previously. No acute vascular findings. Aortic atherosclerotic calcification noted. Reproductive: No mass, inflammatory process, or abnormal fluid collections identified. Pelvic floor laxity with small to moderate cystocele incidentally noted. Other:  None. Musculoskeletal:  No suspicious bone lesions identified. IMPRESSION: Increased moderate right hydronephrosis due to retroperitoneal lymphadenopathy. Increased mild abdominal retroperitoneal lymphadenopathy since prior exam. Stable moderate hiatal hernia. Pelvic floor laxity with small to moderate cystocele. Aortic Atherosclerosis (ICD10-I70.0). Electronically Signed   By: JMarlaine HindM.D.   On: 12/05/2020 13:38     ASSESSMENT:  1.  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  Mass measures 6 x 5.8 cm and is partially calcified.  Separate solid component superior to this in the left periaortic region measures 3.5 cm, SUV 14.3.  Cystic component medial to the lower pole of the left kidney is without hypermetabolic activity, possibly a lymphocele. -CT-guided biopsy of the right retroperitoneal lymph node consistent with adenocarcinoma with psammoma bodies.  Morphology and immunotherapy consistent with ovarian serous carcinoma/primary peritoneal carcinoma. -Germline mutation testing was negative. -Foundation 1 testing shows MS-stable.  Loss of heterozygosity was less than 16%. -CA-125 346 on 06/23/2019. - 6 cycles of carboplatin and paclitaxel from 07/28/2019 through 11/17/2019 -CTAP on 10/04/2019 after 3 cycles of chemotherapy showed retroperitoneal adenopathy at the bifurcation measuring 5.2 x 2.9 cm, left para-aortic lymphadenopathy measuring 5 x 4.4 cm, both of them decreased in size when compared to most recent PET scan.  CT scan report says that one of the lesion has gotten bigger but this was compared to CT scan from 04/02/2019. -  PET scan on December 05, 2019 shows 4.8 x 4.2 cm fluid filled density in the left para-aortic region, non-FDG avid favoring benign retroperitoneal cyst versus lymphangioma.  Right/anterior para-aortic mixed cystic/solid lesions measuring 2.4 x 4.8 cm, SUV 2.5, previous SUV 19.5. -Robotic assisted laparoscopic total hysterectomy and bilateral salpingo-oophorectomy by Dr. Denman George on 01/03/2020. -Pathology showed soft tissue deformities 2 sites and psammomatous calcifications and chronic inflammation with no malignancy identified in the para-aortic lymph node.  Right salpingo-oophorectomy showed microscopic focus of residual  adenocarcinoma less than 1 mm.  No malignancy in the left ovary.  [id number]. -As she had prior difficulty with chemotherapy, no further chemotherapy after surgery was recommended.   PLAN:  1.  Ovarian serous carcinoma: - She was recently evaluated in ER in September when she had right-sided abdominal pain and low back pain.  CT scan at that time did not show any major change in lymph nodes. - We reviewed CT from 12/04/2020.  Retroperitoneal lymph node in the portacaval space measures 5.6 x 3.5 cm, increased from 5 x 3.1 cm.  1.4 cm left para-aortic lymph node is stable. - We reviewed her labs which showed normal LFTs and CBC.  CA125 is normal at 20.6. - We had a prolonged discussion again about various options including observation versus radiation versus systemic therapy. - I have strongly recommended her to consider SBRT like radiation therapy since there is only 1 lymph node area that is increasing.  This is also causing right hydronephrosis. - She finally agreed for radiation.  We will make a referral to radiation oncology. - RTC 4 months for follow-up with repeat CT scan of the abdomen and pelvis with contrast and tumor marker.  2.  Low back pain: - This has improved in the last few months.   3.  Constipation: - Continue Metamucil as needed.   4.  Hypertension: - Continue amlodipine and irbesartan.   5.  Difficulty sleeping: - Continue lorazepam as needed.      Orders placed this encounter:  No orders of the defined types were placed in this encounter.    Derek Jack, MD [facility] [phone]   I, Thana Ates, am acting as a scribe for Dr. Derek Jack.  I, Derek Jack MD, have reviewed the above documentation for accuracy and completeness, and I agree with the above.

## 2020-12-11 ENCOUNTER — Inpatient Hospital Stay (HOSPITAL_BASED_OUTPATIENT_CLINIC_OR_DEPARTMENT_OTHER): Payer: Medicare Other | Admitting: Hematology

## 2020-12-11 ENCOUNTER — Other Ambulatory Visit: Payer: Self-pay

## 2020-12-11 VITALS — BP 147/84 | HR 80 | Temp 97.0°F | Resp 20 | Wt 197.5 lb

## 2020-12-11 DIAGNOSIS — K5900 Constipation, unspecified: Secondary | ICD-10-CM | POA: Diagnosis not present

## 2020-12-11 DIAGNOSIS — Z9221 Personal history of antineoplastic chemotherapy: Secondary | ICD-10-CM | POA: Diagnosis not present

## 2020-12-11 DIAGNOSIS — M545 Low back pain, unspecified: Secondary | ICD-10-CM | POA: Diagnosis not present

## 2020-12-11 DIAGNOSIS — I10 Essential (primary) hypertension: Secondary | ICD-10-CM | POA: Diagnosis not present

## 2020-12-11 DIAGNOSIS — C569 Malignant neoplasm of unspecified ovary: Secondary | ICD-10-CM | POA: Diagnosis not present

## 2020-12-11 DIAGNOSIS — Z79899 Other long term (current) drug therapy: Secondary | ICD-10-CM | POA: Diagnosis not present

## 2020-12-11 DIAGNOSIS — C772 Secondary and unspecified malignant neoplasm of intra-abdominal lymph nodes: Secondary | ICD-10-CM | POA: Diagnosis not present

## 2020-12-11 NOTE — Patient Instructions (Signed)
[facility] at [facility] Discharge Instructions  You were seen and examined today by Dr. Delton Coombes.  You will be scheduled for appointment with the radiation oncologist. Please keep follow up appointment as scheduled.    Thank you for choosing [facility] at [facility] to provide your oncology and hematology care.  To afford each patient quality time with our provider, please arrive at least 15 minutes before your scheduled appointment time.   If you have a lab appointment with the [facility] please come in thru the Main Entrance and check in at the main information desk.  You need to re-schedule your appointment should you arrive 10 or more minutes late.  We strive to give you quality time with our providers, and arriving late affects you and other patients whose appointments are after yours.  Also, if you no show three or more times for appointments you may be dismissed from the clinic at the providers discretion.     Again, thank you for choosing [facility]  Our hope is that these requests will decrease the amount of time that you wait before being seen by our physicians.       _____________________________________________________________  Should you have questions after your visit to [facility], please contact our office at [phone] and follow the prompts.  Our office hours are 8:00 a.m. and 4:30 p.m. Monday - Friday.  Please note that voicemails left after 4:00 p.m. may not be returned until the following business day.  We are closed weekends and major holidays.  You do have access to a nurse 24-7, just call the main number to the clinic [phone] and do not press any options, hold on the line and a nurse will answer the phone.    For prescription refill requests, have your pharmacy contact our office and allow 72 hours.    Due to Covid, you will need to wear a mask upon entering the hospital. If you do  not have a mask, a mask will be given to you at the Main Entrance upon arrival. For doctor visits, patients may have 1 support person age 57 or older with them. For treatment visits, patients can not have anyone with them due to social distancing guidelines and our immunocompromised population.

## 2020-12-17 NOTE — Progress Notes (Signed)
GYN Location of Tumor / Histology:  history of right ovarian cancer, new right retroperitoneal lymph node.  Laura [name] presented with symptoms of:  elevated CA 125  Biopsies revealed:  CT-guided biopsy of the right retroperitoneal lymph node consistent with adenocarcinoma with psammoma bodies.  Morphology and immunotherapy consistent with ovarian serous carcinoma/primary peritoneal carcinoma.  Past/Anticipated interventions by Gyn/Onc surgery, if any:  Operation: Robotic-assisted laparoscopic total hysterectomy with bilateral salpingoophorectomy  Surgeon: Donaciano Eva  Past/Anticipated interventions by medical oncology, if any:  history of bulky retroperitoneal metastatic gynecologic high grade serous carcinoma of the right ovary, BRCA negative. This was treated with neoadjuvant chemotherapy, surgery in December, 2021 showing complete pathologic response. Carboplatin and paclitaxel x 6 completed on 11/17/19.   strongly recommended her to consider SBRT like radiation therapy since there is only 1 lymph node area that is increasing.  This is also causing right hydronephrosis.   Weight changes, if any: no  Bowel/Bladder complaints, if any: Yes.  , diarrhea, incomplete bladder emptying, frequency, nocturia up to 6x  Nausea/Vomiting, if any: no  Pain issues, if any:  yes, low back and pelvic achiness  SAFETY ISSUES: Prior radiation? no Pacemaker/ICD? no Possible current pregnancy? no, hysterectomy Is the patient on methotrexate? no  Current Complaints / other details:  insomnia, weakness, fatigue  Vitals:   12/24/20 1341  BP: (!) 159/68  Pulse: 88  Resp: 20  Temp: 97.9 F (36.6 C)  SpO2: 100%  Weight: 196 lb 12.8 oz (89.3 kg)  Height: 5' 4"  (1.626 m)

## 2020-12-23 NOTE — Progress Notes (Signed)
Radiation Oncology         ([phone]) [phone] ________________________________  Initial Outpatient Consultation  Name: Laura [name] MRN: [id number]  Date: 12/24/2020  DOB: December 15, 1945  GY:FVCB, Hunt Oris, MD  Derek Jack, MD   REFERRING PHYSICIAN: Derek Jack, MD  DIAGNOSIS: The primary encounter diagnosis was Malignant neoplasm of both ovaries ([place]). A diagnosis of Secondary malignant neoplasm of intra-abdominal lymph nodes ([place]) was also pertinent to this visit.  Recurrent ovarian Cancer   HISTORY OF PRESENT ILLNESS::Laura [name] is a 75 y.o. female who is accompanied by no one. she is seen as a Ambulance person for an opinion concerning radiation therapy as part of management for her diagnosed ovarian cancer. The patient was initially diagnosed in 2021; her cancer history is as follows.  The patient  initially presented with right flank and abdominal pain, and subsequently underwent a CT of the abdomen and pelvis on 04/02/2019 which showed mixed solid and cystic appearing retroperitoneal lesions. Lesions were noted to possibly reflect conglomerate nodal mass lesions or metastases some of which may be necrotic. Severe right hydroureteronephrosis to the level of the mid ureter was appreciated as well, with possible occlusion by the retroperitoneal lesions above.   PET on 05/17/2019 showed a solid retroperitoneal mass anterior to the aortic bifurcation with an SUV 19.5.  The mass was seen to measure 6 x 5.8 cm and appear partially calcified.  A separate solid component superior to the mass was also seen in the left periaortic region measuring 3.5 cm, with an SUV 14.3. The cystic component which was seen medial to the lower pole of the left kidney was without hypermetabolic activity, and noted as possibly a lymphocele.  Biopsy performed on 06/06/2019 of the right retroperitoneal lymph node revealed adenocarcinoma with psammoma bodies.  Morphology and immunotherapy consistent  with ovarian serous carcinoma/primary peritoneal carcinoma. (Foundation 1 testing performed showed Ms-stable, and loss of heterozygosity less than 16%).   Subsequently, the patient received 6 cycles of carboplatin and paclitaxel from 07/28/2019 through 11/17/2019.   CTAP on 10/04/2019 after 3 cycles of chemotherapy showed retroperitoneal adenopathy at the bifurcation measuring 5.2 x 2.9 cm, left para-aortic lymphadenopathy measuring 5 x 4.4 cm; with both of them showing a decrease in size when compared to most recent PET scan.  CT also indicated that one of the lesion's had gotten bigger, but this was compared to CT scan from 04/02/2019.  PET scan on 12/05/2019 showed a 4.8 x 4.2 cm fluid filled density in the left para-aortic region, non-FDG avid, favoring benign retroperitoneal cyst versus lymphangioma. Right/anterior para-aortic mixed cystic/solid lesions were also appreciated, measuring 2.4 x 4.8 cm, SUV 2.5, with a previous SUV noted of 19.5.  Subsequently, the patient underwent robotic assisted laparoscopic total hysterectomy and BSO under the care of Dr. Denman George on 01/03/2020. Pathology from the procedure revealed:  --Right ovaries and fallopian tubes: microscopic focus of residual adenocarcinoma status post neoadjuvant therapy, and leiomyoma measuring 3.2 cm.  --Left ovaries and fallopian tubes: benign --excision of para aortic lymph node: soft tissue with foamy histiocytes, psammomatous calcifications, and chronic inflammation. No malignancy identified.   CT of the abdomen and pelvis on 05/02/19 showed significant further reduction in retroperitoneal adenopathy compared to PET-CT of 12/05/2019. No new metastatic lesions were identified.  CT of the abdomen and pelvis on 05/30/20, prompted due to a several week history of abdominal pain, demonstrated a probable slight enlargement of a lymph node adjacent to the aortic bifurcation. Other smaller lymph nodes were  seen to be essentially unchanged.  Additionally, an area of potential mural thickening and luminal narrowing in the sigmoid colon was appreciated, concerning for potential sigmoid neoplasm. Further evaluation with nonemergent colonoscopy was suggested in the near future if clinically appropriate. Retroperitoneal lymphadenopathy was appreciated as overall very similar to prior examination, with slight regression of a lymph node adjacent to the left renal hilum seen. No extra nodal metastatic disease noted elsewhere in the abdomen.  CT of the abdomen and pelvis on 07/07/20 showed slight interval improvement of left periaortic adenopathy, with stable appearance of intra-aortocaval lymphadenopathy compared to previous imaging. No acute findings were otherwise appreciated in the abdomen or pelvis.   CT of the abdomen and pelvis on 08/24/20 revealed a mild increase in sie of retroperitoneal lymphadenopathy in the aortocaval space, causing obstruction of the proximal right ureter. Stable mild left paraaortic lymphadenopathy was appreciated as well, with no new sites of metastatic disease identified.  The patient presented to Dr. Delton Coombes on 08/30/20 in the setting of disease recurrence (in para-aortic nodal beds). Options for treatment were discussed with the patient including radiation, chemotherapy, or close follow-up.  She was asymptomatic at that time and wished to proceed with close interval follow-up.    In recent history, the patient presented to the ED on 09/24/20 for evaluation of right-sided back and abdominal pain.  Patient reported that she had been having some mild intermittent pains on her right side for several months. On presentation however, she developed pain in the posterior flank that lasted for some time.  This pain resolved and the pain moved into the right groin area. Patient reported the pain as much more severe than what she has had in the past, rated at an 8 out of 10. CT of the abdomen and pelvis performed during  evaluation revealed unchanged retroperitoneal nodal metastases, and unchanged associated moderate right hydroureteronephrosis, on the basis of extrinsic compression of the proximal right ureter. Normal post-hysterectomy and BSO procedural findings were appreciated as well.   Subsequently, the patient followed up with Dr. Berline Lopes on 11/21/20. During which time, the patient was noted to be doing well other than some right lower back pain along her spine, which intermittently wraps to the front of her pelvis. The patient reported that this started after her surgery and defined the pain as similar to menstrual cramps.  Given that the patient had been asymptomatic, she voiced disinterest in pursuing further treatment.   CT of the abdomen ane pelvis on 12/04/20 showed increased moderate right hydronephrosis due to retroperitoneal lymphadenopathy, as well as increased mild abdominal retroperitoneal lymphadenopathy since prior exam. Retroperitoneal lymph node in the portacaval space was appreciated to measure 5.6 x 3.5 cm, increased from 5 x 3.1 cm.  1.4 cm left para-aortic lymph node appeared stable.     The patient most recently followed up with Dr. Delton Coombes on 12/11/20. During which time, Dr. Delton Coombes strongly recommended the patient to consider "SBRT like"since there is only 1 lymph node area that is increasing (which is also causing right hydronephrosis). Following discussion of the risks and benefits, the patient voiced interest in pursuing RT.    PRIOR THERAPY:  1. Carboplatin, paclitaxel and Aloxi x 6 cycles from 07/28/2019 to 11/17/2019. 2. Robotic assisted BSO with tumor debulking on 01/03/2020  PREVIOUS RADIATION THERAPY: No  PAST MEDICAL HISTORY:  Past Medical History:  Diagnosis Date   Anemia    Anxiety    Arthritis    back of neck, bones spurs on neck  Cancer [facility])    ovarian cancer   Cataract    Cervical disc disease    Diabetes mellitus    Family history of thyroid cancer     Heart murmur    Hydronephrosis 08/24/2020   Right Kidney (stable per CT)   Hyperlipidemia    Hypertension    Mucoid cyst of joint    right thumb   Neuropathy    Port-A-Cath in place 07/21/2019   Reflux    Sleep apnea    wears CPAP nightly   Vertigo     PAST SURGICAL HISTORY: Past Surgical History:  Procedure Laterality Date   ABDOMINAL HYSTERECTOMY     vaginal   BLADDER SURGERY     BREAST EXCISIONAL BIOPSY Bilateral    BREAST SURGERY     CHOLECYSTECTOMY     COLONOSCOPY     fibroids removed     breast (both breasts)   IR IMAGING GUIDED PORT INSERTION  07/26/2019   right   MASS EXCISION Right 06/26/2016   Procedure: EXCISION MUCOID TUMOR RIGHT THUMB, IP RIGHT THUMB;  Surgeon: Daryll Brod, MD;  Location: [facility];  Service: Orthopedics;  Laterality: Right;   PARTIAL HYSTERECTOMY     ROBOTIC ASSISTED BILATERAL SALPINGO OOPHERECTOMY N/A 01/03/2020   Procedure: XI ROBOTIC ASSISTED BILATERAL SALPINGO OOPHORECTOMY, RADICAL TUMOR DEBULKING;  Surgeon: Everitt Amber, MD;  Location: [facility];  Service: Gynecology;  Laterality: N/A;   ROBOTIC PELVIC AND PARA-AORTIC LYMPH NODE DISSECTION N/A 01/03/2020   Procedure: XI ROBOTIC PARA-AORTIC LYMPHADENECTOMY;  Surgeon: Everitt Amber, MD;  Location: [facility];  Service: Gynecology;  Laterality: N/A;    FAMILY HISTORY:  Family History  Problem Relation Age of Onset   Hypertension Mother    Diabetes Father    Hypertension Father    Diabetes Sister    Breast cancer Sister        stage 0   Hypertension Sister    Stroke Sister    Diabetes Maternal Aunt    Thyroid cancer Daughter 81   Hypertension Other    Colon cancer Neg Hx    Esophageal cancer Neg Hx    Stomach cancer Neg Hx    Rectal cancer Neg Hx    Endometrial cancer Neg Hx    Ovarian cancer Neg Hx     SOCIAL HISTORY:  Social History   Tobacco Use   Smoking status: Never   Smokeless tobacco: Never  Vaping Use   Vaping Use: Never used  Substance Use Topics    Alcohol use: No    Alcohol/week: 0.0 standard drinks   Drug use: No    ALLERGIES:  Allergies  Allergen Reactions   Ciprofloxacin Swelling    Torn tendon   Hydrocodone Bit-Homatrop Mbr Other (See Comments)    Vertigo *pt strongly prefers to never take*   Sulfa Antibiotics Hives, Itching and Swelling    Tongue swells   Augmentin [Amoxicillin-Pot Clavulanate]     Diarrhea; can take PCN/ Amoxicillin   Codeine Itching   Crestor [Rosuvastatin Calcium] Other (See Comments)    Did something to memory     Doxycycline Other (See Comments)    Severe rectal Gas.   Gabapentin     disoriented   Keflex [Cephalexin] Diarrhea and Nausea And Vomiting   Naproxen Other (See Comments)    Stomach cramps   Statins     Muscle weakness   Prednisone Anxiety    *pt strongly prefers to never be given prednisone*     MEDICATIONS:  Current Outpatient Medications  Medication Sig Dispense Refill   acetaminophen (TYLENOL) 500 MG tablet Take 1 tablet (500 mg total) by mouth every 6 (six) hours as needed. 30 tablet 0   amLODipine (NORVASC) 5 MG tablet TAKE 1 TABLET (5 MG TOTAL) BY MOUTH DAILY. 90 tablet 3   Blood Glucose Monitoring Suppl (ONE TOUCH ULTRA 2) w/Device KIT Use as directed 1 each 0   Camphor-Eucalyptus-Menthol (VICKS VAPORUB EX) Apply 1 application topically.     Cholecalciferol (VITAMIN D3) 125 MCG (5000 UT) TABS Take 10,000-15,000 Units by mouth daily.     Eszopiclone 3 MG TABS Take 1 tablet (3 mg total) by mouth at bedtime. Take immediately before bedtime 90 tablet 1   famotidine (PEPCID) 20 MG tablet Take 20 mg by mouth 2 (two) times daily as needed for heartburn or indigestion.     feeding supplement (BOOST HIGH PROTEIN) LIQD Take 237 mLs by mouth daily.     glimepiride (AMARYL) 2 MG tablet Take 1 tablet (2 mg total) by mouth daily before breakfast. 90 tablet 3   irbesartan (AVAPRO) 75 MG tablet Take 1 tablet (75 mg total) by mouth daily. 90 tablet 3   Lancets (ONETOUCH ULTRASOFT)  lancets USE TO CHECK BLOOD SUGAR TWICE DAILY 100 each 3   loperamide (IMODIUM) 2 MG capsule Take 2 mg by mouth as needed for diarrhea or loose stools.      loratadine (CLARITIN) 10 MG tablet Take 10 mg by mouth daily as needed (allergies.).      Multiple Vitamins-Minerals (AIRBORNE PO) Take by mouth.     ONETOUCH ULTRA test strip USE TO CHECK BLOOD SUGARS TWO TIMES DAILY 100 strip 3   vitamin B-12 (CYANOCOBALAMIN) 1000 MCG tablet Take 1,000 mcg by mouth daily.      LORazepam (ATIVAN) 0.5 MG tablet Take 1 tablet (0.5 mg total) by mouth 2 (two) times daily as needed for anxiety. (Patient not taking: Reported on 12/24/2020) 60 tablet 1   morphine (MSIR) 15 MG tablet Take 1 tablet (15 mg total) by mouth every 6 (six) hours as needed for severe pain. (Patient not taking: Reported on 12/24/2020) 10 tablet 0   OVER THE COUNTER MEDICATION Apply 1 application topically daily as needed (pain). Triderma otc pain cream (Patient not taking: Reported on 12/24/2020)     polyethylene glycol (MIRALAX / GLYCOLAX) 17 g packet  (Patient not taking: Reported on 12/24/2020)     prochlorperazine (COMPAZINE) 10 MG tablet Take 10 mg by mouth every 6 (six) hours as needed for nausea or vomiting. (Patient not taking: Reported on 12/24/2020)     psyllium (METAMUCIL) 0.52 g capsule Take 1 capsule (0.52 g total) by mouth daily. (Patient not taking: Reported on 12/24/2020)     No current facility-administered medications for this encounter.    REVIEW OF SYSTEMS:  A 10+ POINT REVIEW OF SYSTEMS WAS OBTAINED including neurology, dermatology, psychiatry, cardiac, respiratory, lymph, extremities, GI, GU, musculoskeletal, constitutional, reproductive, HEENT.  She reports some intermittent low back and right pelvis pain.  She denies any hematuria or obvious flank pain   PHYSICAL EXAM:  height is 5' 4" (1.626 m) and weight is 196 lb 12.8 oz (89.3 kg). Her temperature is 97.9 F (36.6 C). Her blood pressure is 159/68 (abnormal) and her  pulse is 88. Her respiration is 20 and oxygen saturation is 100%.   General: Alert and oriented, in no acute distress HEENT: Head is normocephalic. Extraocular movements are intact.  Neck: Neck is supple, no palpable  cervical or supraclavicular lymphadenopathy. Heart: Regular in rate and rhythm with no murmurs, rubs, or gallops. Chest: Clear to auscultation bilaterally, with no rhonchi, wheezes, or rales. Abdomen: Soft, nontender, nondistended, with no rigidity or guarding. Extremities: No cyanosis or edema. Lymphatics: see Neck Exam Skin: No concerning lesions. Musculoskeletal: symmetric strength and muscle tone throughout. Neurologic: Cranial nerves II through XII are grossly intact. No obvious focalities. Speech is fluent. Coordination is intact. Psychiatric: Judgment and insight are intact. Affect is appropriate.   ECOG = 1  0 - Asymptomatic (Fully active, able to carry on all predisease activities without restriction)  1 - Symptomatic but completely ambulatory (Restricted in physically strenuous activity but ambulatory and able to carry out work of a light or sedentary nature. For example, light housework, office work)  2 - Symptomatic, <50% in bed during the day (Ambulatory and capable of all self care but unable to carry out any work activities. Up and about more than 50% of waking hours)  3 - Symptomatic, >50% in bed, but not bedbound (Capable of only limited self-care, confined to bed or chair 50% or more of waking hours)  4 - Bedbound (Completely disabled. Cannot carry on any self-care. Totally confined to bed or chair)  5 - Death   Eustace Pen MM, Creech RH, Tormey DC, et al. [phone]). "Toxicity and response criteria of the [facility]". [place] Oncol. 5 (6): 649-55  LABORATORY DATA:  Lab Results  Component Value Date   WBC 8.5 12/04/2020   HGB 12.1 12/04/2020   HCT 36.2 12/04/2020   MCV 89.8 12/04/2020   PLT 208 12/04/2020   NEUTROABS 5.2  12/04/2020   Lab Results  Component Value Date   NA 138 12/04/2020   K 4.1 12/04/2020   CL 106 12/04/2020   CO2 25 12/04/2020   GLUCOSE 97 12/04/2020   CREATININE 0.88 12/04/2020   CALCIUM 9.9 12/04/2020      RADIOGRAPHY: CT Abdomen Pelvis W Contrast  Result Date: 12/05/2020 CLINICAL DATA:  Follow-up ovarian carcinoma. Previous surgery and chemotherapy. EXAM: CT ABDOMEN AND PELVIS WITH CONTRAST TECHNIQUE: Multidetector CT imaging of the abdomen and pelvis was performed using the standard protocol following bolus administration of intravenous contrast. CONTRAST:  131m OMNIPAQUE IOHEXOL 300 MG/ML  SOLN COMPARISON:  09/24/2020 FINDINGS: Lower Chest: No acute findings. Hepatobiliary: No hepatic masses identified. Prior cholecystectomy. No evidence of biliary obstruction. Pancreas: Soft tissue prominence is seen in the pancreatic head which measures approximately 2.9 x 2.9 cm on image 27/9, without significant change compared to prior study Spleen: Within normal limits in size and appearance. Adrenals/Urinary Tract: No masses identified. Increased moderate right hydronephrosis is seen due to retroperitoneal lymphadenopathy which obstructs the proximal right ureter. Unremarkable unopacified urinary bladder. Stomach/Bowel: Stable moderate hiatal hernia. No evidence of obstruction, inflammatory process or abnormal fluid collections. Vascular/Lymphatic: 1.4 cm left paraaortic lymph node on image 31/2 remains stable. Retroperitoneal lymphadenopathy in the portacaval space measuring 3.5 x 5.6 cm, mildly increased from 3.1 x 5.0 cm previously. No acute vascular findings. Aortic atherosclerotic calcification noted. Reproductive: No mass, inflammatory process, or abnormal fluid collections identified. Pelvic floor laxity with small to moderate cystocele incidentally noted. Other:  None. Musculoskeletal:  No suspicious bone lesions identified. IMPRESSION: Increased moderate right hydronephrosis due to  retroperitoneal lymphadenopathy. Increased mild abdominal retroperitoneal lymphadenopathy since prior exam. Stable moderate hiatal hernia. Pelvic floor laxity with small to moderate cystocele. Aortic Atherosclerosis (ICD10-I70.0). Electronically Signed   By: JMarlaine HindM.D.   On:  12/05/2020 13:38      IMPRESSION: Recurrent ovarian Cancer   The patient has an isolated recurrence in the portacaval space measuring approximately 5.6 cm in greatest dimension.  This has increased compared to previous CT scans.  In addition the patient does have right-sided hydronephrosis related to this soft tissue mass.  She would be a good candidate for a short course of radiation therapy directed at this area of recurrence.  She has a smaller 1.4 cm left periaortic lymph node which has been stable.  Today, I talked to the patient about the findings and work-up thus far.  We discussed the natural history of recurrent ovarian cancer and general treatment, highlighting the role of radiotherapy in the management.  We discussed the available radiation techniques, and focused on the details of logistics and delivery.  We reviewed the anticipated acute and late sequelae associated with radiation in this setting.  The patient was encouraged to ask questions that I answered to the best of my ability.  A patient consent form was discussed and signed.  We retained a copy for our records.  The patient would like to proceed with radiation and will be scheduled for CT simulation.  PLAN: She will return on December 27 for SBRT simulation directed at this solitary area of recurrence which is progressive on recent CT scans.  It is doubtful that she would be a candidate for a 5 fraction regimen but most certainly a 10 fraction regimen depending on dose constraints in the lower abdominal region.  Her simulation date will be moved up if there is a cancellation as she is eager to get started at this time.   60 minutes of total time was spent  for this patient encounter, including preparation, face-to-face counseling with the patient and coordination of care, physical exam, and documentation of the encounter.   ------------------------------------------------  Blair Promise, PhD, MD  This document serves as a record of services personally performed by Gery Pray, MD. It was created on his behalf by Roney Mans, a trained medical scribe. The creation of this record is based on the scribe's personal observations and the provider's statements to them. This document has been checked and approved by the attending provider.

## 2020-12-24 ENCOUNTER — Other Ambulatory Visit: Payer: Self-pay

## 2020-12-24 ENCOUNTER — Encounter: Payer: Self-pay | Admitting: Radiation Oncology

## 2020-12-24 ENCOUNTER — Ambulatory Visit
Admission: RE | Admit: 2020-12-24 | Discharge: 2020-12-24 | Disposition: A | Discharge status: Home / Self Care | Payer: Medicare Other | Source: Ambulatory Visit | Attending: Radiation Oncology | Admitting: Radiation Oncology

## 2020-12-24 VITALS — BP 159/68 | HR 88 | Temp 97.9°F | Resp 20 | Ht 64.0 in | Wt 196.8 lb

## 2020-12-24 DIAGNOSIS — E119 Type 2 diabetes mellitus without complications: Secondary | ICD-10-CM | POA: Insufficient documentation

## 2020-12-24 DIAGNOSIS — M5459 Other low back pain: Secondary | ICD-10-CM | POA: Insufficient documentation

## 2020-12-24 DIAGNOSIS — Z803 Family history of malignant neoplasm of breast: Secondary | ICD-10-CM | POA: Insufficient documentation

## 2020-12-24 DIAGNOSIS — Z808 Family history of malignant neoplasm of other organs or systems: Secondary | ICD-10-CM | POA: Diagnosis not present

## 2020-12-24 DIAGNOSIS — I700 Atherosclerosis of aorta: Secondary | ICD-10-CM | POA: Diagnosis not present

## 2020-12-24 DIAGNOSIS — F419 Anxiety disorder, unspecified: Secondary | ICD-10-CM | POA: Diagnosis not present

## 2020-12-24 DIAGNOSIS — G629 Polyneuropathy, unspecified: Secondary | ICD-10-CM | POA: Insufficient documentation

## 2020-12-24 DIAGNOSIS — E785 Hyperlipidemia, unspecified: Secondary | ICD-10-CM | POA: Diagnosis not present

## 2020-12-24 DIAGNOSIS — I10 Essential (primary) hypertension: Secondary | ICD-10-CM | POA: Insufficient documentation

## 2020-12-24 DIAGNOSIS — Z90710 Acquired absence of both cervix and uterus: Secondary | ICD-10-CM | POA: Diagnosis not present

## 2020-12-24 DIAGNOSIS — C772 Secondary and unspecified malignant neoplasm of intra-abdominal lymph nodes: Secondary | ICD-10-CM

## 2020-12-24 DIAGNOSIS — G4730 Sleep apnea, unspecified: Secondary | ICD-10-CM | POA: Diagnosis not present

## 2020-12-24 DIAGNOSIS — Z90722 Acquired absence of ovaries, bilateral: Secondary | ICD-10-CM | POA: Insufficient documentation

## 2020-12-24 DIAGNOSIS — N1330 Unspecified hydronephrosis: Secondary | ICD-10-CM | POA: Diagnosis not present

## 2020-12-24 DIAGNOSIS — Z79899 Other long term (current) drug therapy: Secondary | ICD-10-CM | POA: Diagnosis not present

## 2020-12-24 DIAGNOSIS — C563 Malignant neoplasm of bilateral ovaries: Secondary | ICD-10-CM

## 2020-12-24 DIAGNOSIS — K449 Diaphragmatic hernia without obstruction or gangrene: Secondary | ICD-10-CM | POA: Insufficient documentation

## 2020-12-24 DIAGNOSIS — R011 Cardiac murmur, unspecified: Secondary | ICD-10-CM | POA: Diagnosis not present

## 2020-12-24 NOTE — Progress Notes (Signed)
See MD note for nursing evaluation. °

## 2021-01-08 ENCOUNTER — Other Ambulatory Visit: Payer: Self-pay

## 2021-01-08 ENCOUNTER — Ambulatory Visit
Admission: RE | Admit: 2021-01-08 | Discharge: 2021-01-08 | Disposition: A | Discharge status: Home / Self Care | Payer: Medicare Other | Source: Ambulatory Visit | Attending: Radiation Oncology | Admitting: Radiation Oncology

## 2021-01-08 DIAGNOSIS — C563 Malignant neoplasm of bilateral ovaries: Secondary | ICD-10-CM | POA: Diagnosis not present

## 2021-01-08 DIAGNOSIS — C772 Secondary and unspecified malignant neoplasm of intra-abdominal lymph nodes: Secondary | ICD-10-CM

## 2021-01-14 ENCOUNTER — Encounter (HOSPITAL_COMMUNITY): Payer: Self-pay | Admitting: Hematology

## 2021-01-17 ENCOUNTER — Ambulatory Visit: Payer: Medicare Other | Admitting: Radiation Oncology

## 2021-01-18 ENCOUNTER — Ambulatory Visit: Payer: Medicare Other

## 2021-01-19 DIAGNOSIS — C772 Secondary and unspecified malignant neoplasm of intra-abdominal lymph nodes: Secondary | ICD-10-CM | POA: Insufficient documentation

## 2021-01-19 DIAGNOSIS — C569 Malignant neoplasm of unspecified ovary: Secondary | ICD-10-CM | POA: Diagnosis present

## 2021-01-19 DIAGNOSIS — C563 Malignant neoplasm of bilateral ovaries: Secondary | ICD-10-CM | POA: Diagnosis not present

## 2021-01-21 ENCOUNTER — Other Ambulatory Visit: Payer: Self-pay

## 2021-01-21 ENCOUNTER — Ambulatory Visit
Admission: RE | Admit: 2021-01-21 | Discharge: 2021-01-21 | Disposition: A | Discharge status: Home / Self Care | Payer: Medicare Other | Source: Ambulatory Visit | Attending: Radiation Oncology | Admitting: Radiation Oncology

## 2021-01-21 ENCOUNTER — Ambulatory Visit: Payer: Medicare Other

## 2021-01-21 DIAGNOSIS — C563 Malignant neoplasm of bilateral ovaries: Secondary | ICD-10-CM | POA: Diagnosis not present

## 2021-01-21 DIAGNOSIS — C772 Secondary and unspecified malignant neoplasm of intra-abdominal lymph nodes: Secondary | ICD-10-CM

## 2021-01-21 DIAGNOSIS — C569 Malignant neoplasm of unspecified ovary: Secondary | ICD-10-CM

## 2021-01-22 ENCOUNTER — Ambulatory Visit
Admission: RE | Admit: 2021-01-22 | Discharge: 2021-01-22 | Disposition: A | Discharge status: Home / Self Care | Payer: Medicare Other | Source: Ambulatory Visit | Attending: Radiation Oncology | Admitting: Radiation Oncology

## 2021-01-22 DIAGNOSIS — C772 Secondary and unspecified malignant neoplasm of intra-abdominal lymph nodes: Secondary | ICD-10-CM | POA: Diagnosis not present

## 2021-01-22 DIAGNOSIS — C563 Malignant neoplasm of bilateral ovaries: Secondary | ICD-10-CM | POA: Diagnosis not present

## 2021-01-23 ENCOUNTER — Ambulatory Visit
Admission: RE | Admit: 2021-01-23 | Discharge: 2021-01-23 | Disposition: A | Discharge status: Home / Self Care | Payer: Medicare Other | Source: Ambulatory Visit | Attending: Radiation Oncology | Admitting: Radiation Oncology

## 2021-01-23 ENCOUNTER — Other Ambulatory Visit: Payer: Self-pay

## 2021-01-23 DIAGNOSIS — C563 Malignant neoplasm of bilateral ovaries: Secondary | ICD-10-CM | POA: Diagnosis not present

## 2021-01-23 DIAGNOSIS — C772 Secondary and unspecified malignant neoplasm of intra-abdominal lymph nodes: Secondary | ICD-10-CM | POA: Diagnosis not present

## 2021-01-24 ENCOUNTER — Ambulatory Visit
Admission: RE | Admit: 2021-01-24 | Discharge: 2021-01-24 | Disposition: A | Discharge status: Home / Self Care | Payer: Medicare Other | Source: Ambulatory Visit | Attending: Radiation Oncology | Admitting: Radiation Oncology

## 2021-01-24 ENCOUNTER — Encounter (HOSPITAL_COMMUNITY): Payer: Self-pay | Admitting: Hematology

## 2021-01-24 DIAGNOSIS — C772 Secondary and unspecified malignant neoplasm of intra-abdominal lymph nodes: Secondary | ICD-10-CM | POA: Diagnosis not present

## 2021-01-24 DIAGNOSIS — C563 Malignant neoplasm of bilateral ovaries: Secondary | ICD-10-CM | POA: Diagnosis not present

## 2021-01-25 ENCOUNTER — Ambulatory Visit
Admission: RE | Admit: 2021-01-25 | Discharge: 2021-01-25 | Disposition: A | Discharge status: Home / Self Care | Payer: Medicare Other | Source: Ambulatory Visit | Attending: Radiation Oncology | Admitting: Radiation Oncology

## 2021-01-25 ENCOUNTER — Other Ambulatory Visit: Payer: Self-pay

## 2021-01-25 ENCOUNTER — Encounter (HOSPITAL_COMMUNITY): Payer: Self-pay | Admitting: Hematology

## 2021-01-25 DIAGNOSIS — C563 Malignant neoplasm of bilateral ovaries: Secondary | ICD-10-CM | POA: Diagnosis not present

## 2021-01-25 DIAGNOSIS — C772 Secondary and unspecified malignant neoplasm of intra-abdominal lymph nodes: Secondary | ICD-10-CM | POA: Diagnosis not present

## 2021-01-28 ENCOUNTER — Ambulatory Visit
Admission: RE | Admit: 2021-01-28 | Discharge: 2021-01-28 | Disposition: A | Discharge status: Home / Self Care | Payer: Medicare Other | Source: Ambulatory Visit | Attending: Radiation Oncology | Admitting: Radiation Oncology

## 2021-01-28 ENCOUNTER — Other Ambulatory Visit: Payer: Self-pay

## 2021-01-28 DIAGNOSIS — C772 Secondary and unspecified malignant neoplasm of intra-abdominal lymph nodes: Secondary | ICD-10-CM | POA: Diagnosis not present

## 2021-01-28 DIAGNOSIS — C563 Malignant neoplasm of bilateral ovaries: Secondary | ICD-10-CM | POA: Diagnosis not present

## 2021-01-29 ENCOUNTER — Ambulatory Visit
Admission: RE | Admit: 2021-01-29 | Discharge: 2021-01-29 | Disposition: A | Discharge status: Home / Self Care | Payer: Medicare Other | Source: Ambulatory Visit | Attending: Radiation Oncology | Admitting: Radiation Oncology

## 2021-01-29 DIAGNOSIS — C772 Secondary and unspecified malignant neoplasm of intra-abdominal lymph nodes: Secondary | ICD-10-CM | POA: Diagnosis not present

## 2021-01-29 DIAGNOSIS — C563 Malignant neoplasm of bilateral ovaries: Secondary | ICD-10-CM | POA: Diagnosis not present

## 2021-01-30 ENCOUNTER — Other Ambulatory Visit: Payer: Self-pay

## 2021-01-30 ENCOUNTER — Ambulatory Visit
Admission: RE | Admit: 2021-01-30 | Discharge: 2021-01-30 | Disposition: A | Discharge status: Home / Self Care | Payer: Medicare Other | Source: Ambulatory Visit | Attending: Radiation Oncology | Admitting: Radiation Oncology

## 2021-01-30 DIAGNOSIS — C563 Malignant neoplasm of bilateral ovaries: Secondary | ICD-10-CM | POA: Diagnosis not present

## 2021-01-30 DIAGNOSIS — C772 Secondary and unspecified malignant neoplasm of intra-abdominal lymph nodes: Secondary | ICD-10-CM | POA: Diagnosis not present

## 2021-01-31 ENCOUNTER — Ambulatory Visit
Admission: RE | Admit: 2021-01-31 | Discharge: 2021-01-31 | Disposition: A | Discharge status: Home / Self Care | Payer: Medicare Other | Source: Ambulatory Visit | Attending: Radiation Oncology | Admitting: Radiation Oncology

## 2021-01-31 DIAGNOSIS — C563 Malignant neoplasm of bilateral ovaries: Secondary | ICD-10-CM | POA: Diagnosis not present

## 2021-01-31 DIAGNOSIS — C772 Secondary and unspecified malignant neoplasm of intra-abdominal lymph nodes: Secondary | ICD-10-CM | POA: Diagnosis not present

## 2021-02-01 ENCOUNTER — Ambulatory Visit
Admission: RE | Admit: 2021-02-01 | Discharge: 2021-02-01 | Disposition: A | Discharge status: Home / Self Care | Payer: Medicare Other | Source: Ambulatory Visit | Attending: Radiation Oncology | Admitting: Radiation Oncology

## 2021-02-01 ENCOUNTER — Encounter: Payer: Self-pay | Admitting: Radiation Oncology

## 2021-02-01 ENCOUNTER — Other Ambulatory Visit: Payer: Self-pay

## 2021-02-01 DIAGNOSIS — C772 Secondary and unspecified malignant neoplasm of intra-abdominal lymph nodes: Secondary | ICD-10-CM | POA: Diagnosis not present

## 2021-02-01 DIAGNOSIS — C563 Malignant neoplasm of bilateral ovaries: Secondary | ICD-10-CM | POA: Diagnosis not present

## 2021-02-05 ENCOUNTER — Other Ambulatory Visit (HOSPITAL_COMMUNITY): Payer: Self-pay | Admitting: Hematology

## 2021-02-05 ENCOUNTER — Encounter: Payer: Self-pay | Admitting: Radiology

## 2021-02-05 DIAGNOSIS — C569 Malignant neoplasm of unspecified ovary: Secondary | ICD-10-CM

## 2021-02-05 DIAGNOSIS — Z95828 Presence of other vascular implants and grafts: Secondary | ICD-10-CM

## 2021-02-07 ENCOUNTER — Other Ambulatory Visit (HOSPITAL_BASED_OUTPATIENT_CLINIC_OR_DEPARTMENT_OTHER): Payer: Self-pay

## 2021-02-22 ENCOUNTER — Encounter: Payer: Self-pay | Admitting: Gynecologic Oncology

## 2021-02-25 ENCOUNTER — Inpatient Hospital Stay: Payer: Medicare Other | Attending: Gynecologic Oncology | Admitting: Gynecologic Oncology

## 2021-02-25 ENCOUNTER — Encounter: Payer: Self-pay | Admitting: Gynecologic Oncology

## 2021-02-25 ENCOUNTER — Other Ambulatory Visit: Payer: Self-pay

## 2021-02-25 VITALS — BP 134/81 | HR 83 | Temp 98.4°F | Resp 18 | Ht 62.6 in | Wt 193.2 lb

## 2021-02-25 DIAGNOSIS — Z79899 Other long term (current) drug therapy: Secondary | ICD-10-CM | POA: Diagnosis not present

## 2021-02-25 DIAGNOSIS — Z90710 Acquired absence of both cervix and uterus: Secondary | ICD-10-CM | POA: Insufficient documentation

## 2021-02-25 DIAGNOSIS — R5383 Other fatigue: Secondary | ICD-10-CM | POA: Diagnosis not present

## 2021-02-25 DIAGNOSIS — C772 Secondary and unspecified malignant neoplasm of intra-abdominal lymph nodes: Secondary | ICD-10-CM | POA: Diagnosis not present

## 2021-02-25 DIAGNOSIS — Z9221 Personal history of antineoplastic chemotherapy: Secondary | ICD-10-CM | POA: Insufficient documentation

## 2021-02-25 DIAGNOSIS — C563 Malignant neoplasm of bilateral ovaries: Secondary | ICD-10-CM | POA: Diagnosis not present

## 2021-02-25 DIAGNOSIS — Z90722 Acquired absence of ovaries, bilateral: Secondary | ICD-10-CM | POA: Insufficient documentation

## 2021-02-25 DIAGNOSIS — Z923 Personal history of irradiation: Secondary | ICD-10-CM | POA: Insufficient documentation

## 2021-02-25 DIAGNOSIS — C569 Malignant neoplasm of unspecified ovary: Secondary | ICD-10-CM

## 2021-02-25 NOTE — Patient Instructions (Signed)
It was good to see you today.  I will plan to see you back for follow-up in 3-4 months.  Please call if you need anything before your next visit.

## 2021-02-25 NOTE — Progress Notes (Signed)
Gynecologic Oncology Return Clinic Visit  02/25/21  Reason for Visit: Surveillance visit in the setting of ovarian cancer  Treatment History: Oncology History  Malignant neoplasm of both ovaries  06/20/2019 Initial Diagnosis   Primary ovarian adenocarcinoma, unspecified laterality ([place])   07/01/2019 Genetic Testing   [facility]     07/11/2019 Genetic Testing   Negative genetic testing:  No pathogenic variants detected on the [facility] Multi-Cancer Panel. The report date is 07/11/2019.  The Multi-Cancer Panel offered by [facility] includes sequencing and/or deletion duplication testing of the following 85 genes: AIP, ALK, APC, ATM, AXIN2,BAP1,  BARD1, BLM, BMPR1A, BRCA1, BRCA2, BRIP1, CASR, CDC73, CDH1, CDK4, CDKN1B, CDKN1C, CDKN2A (p14ARF), CDKN2A (p16INK4a), CEBPA, CHEK2, CTNNA1, DICER1, DIS3L2, EGFR (c.2369C>T, p.Thr790Met variant only), EPCAM (Deletion/duplication testing only), FH, FLCN, GATA2, GPC3, GREM1 (Promoter region deletion/duplication testing only), HOXB13 (c.251G>A, p.Gly84Glu), HRAS, KIT, MAX, MEN1, MET, MITF (c.952G>A, p.Glu318Lys variant only), MLH1, MSH2, MSH3, MSH6, MUTYH, NBN, NF1, NF2, NTHL1, PALB2, PDGFRA, PHOX2B, PMS2, POLD1, POLE, POT1, PRKAR1A, PTCH1, PTEN, RAD50, RAD51C, RAD51D, RB1, RECQL4, RET, RNF43, RUNX1, SDHAF2, SDHA (sequence changes only), SDHB, SDHC, SDHD, SMAD4, SMARCA4, SMARCB1, SMARCE1, STK11, SUFU, TERC, TERT, TMEM127, TP53, TSC1, TSC2, VHL, WRN and WT1.   07/28/2019 -  Chemotherapy   The patient had palonosetron (ALOXI) injection 0.25 mg, 0.25 mg, Intravenous,  Once, 6 of 7 cycles Administration: 0.25 mg (07/28/2019), 0.25 mg (08/18/2019), 0.25 mg (09/15/2019), 0.25 mg (10/06/2019), 0.25 mg (10/27/2019), 0.25 mg (11/17/2019) pegfilgrastim (NEULASTA ONPRO KIT) injection 6 mg, 6 mg, Subcutaneous, Once, 6 of 7 cycles Administration: 6 mg (07/28/2019), 6 mg (08/18/2019), 6 mg (09/15/2019), 6 mg (10/06/2019), 6 mg (10/27/2019), 6 mg (11/17/2019) CARBOplatin (PARAPLATIN) 470 mg  in sodium chloride 0.9 % 250 mL chemo infusion, 470 mg (100 % of original dose 470 mg), Intravenous,  Once, 6 of 7 cycles Dose modification:   (original dose 470 mg, Cycle 1, Reason: Provider Judgment),   (original dose 568.2 mg, Cycle 2),   (original dose 460.5 mg, Cycle 3), 460 mg (original dose 460 mg, Cycle 4, Reason: Provider Judgment),   (original dose 473.5 mg, Cycle 5, Reason: Provider Judgment), 460 mg (original dose 460 mg, Cycle 6) Administration: 470 mg (07/28/2019), 570 mg (08/18/2019), 460 mg (09/15/2019), 460 mg (10/06/2019), 460 mg (10/27/2019), 460 mg (11/17/2019) fosaprepitant (EMEND) 150 mg in sodium chloride 0.9 % 145 mL IVPB, 150 mg, Intravenous,  Once, 6 of 7 cycles Administration: 150 mg (07/28/2019), 150 mg (08/18/2019), 150 mg (09/15/2019), 150 mg (10/06/2019), 150 mg (10/27/2019), 150 mg (11/17/2019) PACLitaxel (TAXOL) 282 mg in sodium chloride 0.9 % 250 mL chemo infusion (> 17m/m2), 140 mg/m2 = 282 mg (80 % of original dose 175 mg/m2), Intravenous,  Once, 6 of 7 cycles Dose modification: 140 mg/m2 (80 % of original dose 175 mg/m2, Cycle 1, Reason: Provider Judgment), 150 mg/m2 (original dose 175 mg/m2, Cycle 2, Reason: Provider Judgment), 140 mg/m2 (original dose 175 mg/m2, Cycle 3, Reason: Provider Judgment), 140 mg/m2 (original dose 175 mg/m2, Cycle 4, Reason: Other (see comments), Comment: fatigue) Administration: 282 mg (07/28/2019), 300 mg (08/18/2019), 282 mg (09/15/2019), 282 mg (10/06/2019), 282 mg (10/27/2019), 282 mg (11/17/2019)   for chemotherapy treatment.       Interval History: Patient presents today for follow-up.  She completed with SBRT to para-aortic recurrence in late January.  She has not yet had follow-up with radiation oncology.  She notes overall doing well although had significant fatigue and weakness during treatment.  This is still increased from baseline and recovering very  slowly.  She had a couple episodes of nausea and diarrhea during treatment, reports  resolution of the symptoms.  Notes having multiple episodes where she would become hypoglycemic during treatment.  She denies any vaginal bleeding or discharge.  She denies abdominal or pelvic pain.  She continues to have some nocturia.  Past Medical/Surgical History: Past Medical History:  Diagnosis Date   Anemia    Anxiety    Arthritis    back of neck, bones spurs on neck   Cancer (HCC)    ovarian cancer   Cataract    Cervical disc disease    Diabetes mellitus    Family history of thyroid cancer    Heart murmur    History of radiation therapy    abdominal lymph nodes 01/21/2021-02/01/2021  Dr Gery Pray   Hydronephrosis 08/24/2020   Right Kidney (stable per CT)   Hyperlipidemia    Hypertension    Mucoid cyst of joint    right thumb   Neuropathy    Port-A-Cath in place 07/21/2019   Reflux    Sleep apnea    wears CPAP nightly   Vertigo     Past Surgical History:  Procedure Laterality Date   ABDOMINAL HYSTERECTOMY     vaginal   BLADDER SURGERY     BREAST EXCISIONAL BIOPSY Bilateral    BREAST SURGERY     CHOLECYSTECTOMY     COLONOSCOPY     fibroids removed     breast (both breasts)   IR IMAGING GUIDED PORT INSERTION  07/26/2019   right   MASS EXCISION Right 06/26/2016   Procedure: EXCISION MUCOID TUMOR RIGHT THUMB, IP RIGHT THUMB;  Surgeon: Daryll Brod, MD;  Location: [place];  Service: Orthopedics;  Laterality: Right;   PARTIAL HYSTERECTOMY     ROBOTIC ASSISTED BILATERAL SALPINGO OOPHERECTOMY N/A 01/03/2020   Procedure: XI ROBOTIC ASSISTED BILATERAL SALPINGO OOPHORECTOMY, RADICAL TUMOR DEBULKING;  Surgeon: Everitt Amber, MD;  Location: [facility];  Service: Gynecology;  Laterality: N/A;   ROBOTIC PELVIC AND PARA-AORTIC LYMPH NODE DISSECTION N/A 01/03/2020   Procedure: XI ROBOTIC PARA-AORTIC LYMPHADENECTOMY;  Surgeon: Everitt Amber, MD;  Location: [facility];  Service: Gynecology;  Laterality: N/A;    Family History  Problem Relation Age of Onset    Hypertension Mother    Diabetes Father    Hypertension Father    Diabetes Sister    Breast cancer Sister        stage 0   Hypertension Sister    Stroke Sister    Diabetes Maternal Aunt    Thyroid cancer Daughter 42   Hypertension Other    Colon cancer Neg Hx    Esophageal cancer Neg Hx    Stomach cancer Neg Hx    Rectal cancer Neg Hx    Endometrial cancer Neg Hx    Ovarian cancer Neg Hx     Social History   Socioeconomic History   Marital status: Divorced    Spouse name: Not on file   Number of children: 4   Years of education: 16   Highest education level: Bachelor's degree (e.g., BA, AB, BS)  Occupational History   Occupation: retired Geographical information systems officer  Tobacco Use   Smoking status: Never   Smokeless tobacco: Never  Vaping Use   Vaping Use: Never used  Substance and Sexual Activity   Alcohol use: No    Alcohol/week: 0.0 standard drinks   Drug use: No   Sexual activity: Not Currently  Other Topics Concern  Not on file  Social History Narrative   Not on file   Social Determinants of Health   Financial Resource Strain: Low Risk    Difficulty of Paying Living Expenses: Not hard at all  Food Insecurity: No Food Insecurity   Worried About Charity fundraiser in the Last Year: Never true   Arboriculturist in the Last Year: Never true  Transportation Needs: No Transportation Needs   Lack of Transportation (Medical): No   Lack of Transportation (Non-Medical): No  Physical Activity: Insufficiently Active   Days of Exercise per Week: 2 days   Minutes of Exercise per Session: 60 min  Stress: No Stress Concern Present   Feeling of Stress : Not at all  Social Connections: Moderately Integrated   Frequency of Communication with Friends and Family: More than three times a week   Frequency of Social Gatherings with Friends and Family: Once a week   Attends Religious Services: More than 4 times per year   Active Member of [employer] or Organizations: Yes   Attends  Music therapist: More than 4 times per year   Marital Status: Divorced    Current Medications:  Current Outpatient Medications:    acetaminophen (TYLENOL) 500 MG tablet, Take 1 tablet (500 mg total) by mouth every 6 (six) hours as needed., Disp: 30 tablet, Rfl: 0   amLODipine (NORVASC) 5 MG tablet, TAKE 1 TABLET (5 MG TOTAL) BY MOUTH DAILY., Disp: 90 tablet, Rfl: 3   Biotin 10000 MCG TABS, Take by mouth., Disp: , Rfl:    Cholecalciferol (VITAMIN D3) 125 MCG (5000 UT) TABS, Take 10,000-15,000 Units by mouth daily., Disp: , Rfl:    Eszopiclone 3 MG TABS, Take 1 tablet (3 mg total) by mouth at bedtime. Take immediately before bedtime (Patient taking differently: Take 3 mg by mouth at bedtime. Take immediately before bedtime Per patient Prn), Disp: 90 tablet, Rfl: 1   famotidine (PEPCID) 20 MG tablet, Take 20 mg by mouth 2 (two) times daily as needed for heartburn or indigestion., Disp: , Rfl:    feeding supplement (BOOST HIGH PROTEIN) LIQD, Take 237 mLs by mouth daily., Disp: , Rfl:    glimepiride (AMARYL) 2 MG tablet, Take 1 tablet (2 mg total) by mouth daily before breakfast., Disp: 90 tablet, Rfl: 3   irbesartan (AVAPRO) 75 MG tablet, Take 1 tablet (75 mg total) by mouth daily., Disp: 90 tablet, Rfl: 3   loperamide (IMODIUM) 2 MG capsule, Take 2 mg by mouth as needed for diarrhea or loose stools. , Disp: , Rfl:    LORazepam (ATIVAN) 0.5 MG tablet, Take 1 tablet (0.5 mg total) by mouth 2 (two) times daily as needed for anxiety., Disp: 60 tablet, Rfl: 1   OVER THE COUNTER MEDICATION, Apply 1 application topically daily as needed (pain). Triderma otc pain cream, Disp: , Rfl:    prochlorperazine (COMPAZINE) 10 MG tablet, TAKE 1 TABLET (10 MG TOTAL) BY MOUTH EVERY 6 (SIX) HOURS AS NEEDED (NAUSEA OR VOMITING)., Disp: 60 tablet, Rfl: 3   solifenacin (VESICARE) 5 MG tablet, Take 5 mg by mouth as needed., Disp: , Rfl:    vitamin B-12 (CYANOCOBALAMIN) 1000 MCG tablet, Take 1,000 mcg by mouth  daily. , Disp: , Rfl:    Blood Glucose Monitoring Suppl (ONE TOUCH ULTRA 2) w/Device KIT, Use as directed, Disp: 1 each, Rfl: 0   Lancets (ONETOUCH ULTRASOFT) lancets, USE TO CHECK BLOOD SUGAR TWICE DAILY, Disp: 100 each, Rfl: 3  loratadine (CLARITIN) 10 MG tablet, Take 10 mg by mouth daily as needed (allergies.).  (Patient not taking: Reported on 02/22/2021), Disp: , Rfl:    morphine (MSIR) 15 MG tablet, Take 1 tablet (15 mg total) by mouth every 6 (six) hours as needed for severe pain. (Patient not taking: Reported on 12/24/2020), Disp: 10 tablet, Rfl: 0   Multiple Vitamins-Minerals (AIRBORNE PO), Take by mouth. (Patient not taking: Reported on 02/22/2021), Disp: , Rfl:    ONETOUCH ULTRA test strip, USE TO CHECK BLOOD SUGARS TWO TIMES DAILY, Disp: 100 strip, Rfl: 3  Review of Systems: Pertinent positives include decreased appetite, fatigue, urinary frequency, hot flashes. Denies fevers, chills, unexplained weight changes. Denies hearing loss, neck lumps or masses, mouth sores, ringing in ears or voice changes. Denies cough or wheezing.  Denies shortness of breath. Denies chest pain or palpitations. Denies leg swelling. Denies abdominal distention, pain, blood in stools, constipation, diarrhea, nausea, vomiting, or early satiety. Denies pain with intercourse, dysuria, hematuria or incontinence. Denies pelvic pain, vaginal bleeding or vaginal discharge.   Denies joint pain, back pain or muscle pain/cramps. Denies itching, rash, or wounds. Denies dizziness, headaches, numbness or seizures. Denies swollen lymph nodes or glands, denies easy bruising or bleeding. Denies anxiety, depression, confusion, or decreased concentration.  Physical Exam: BP 134/81 (BP Location: Right Arm, Patient Position: Sitting)    Pulse 83    Temp 98.4 F (36.9 C) (Oral)    Resp 18    Ht 5' 2.6" (1.59 m)    Wt 193 lb 3.2 oz (87.6 kg)    SpO2 100%    BMI 34.66 kg/m  General: Alert, oriented, no acute distress. HEENT:  Normocephalic, atraumatic, sclera anicteric. Chest: Clear to auscultation bilaterally.  No wheezes or rhonchi. Cardiovascular: Regular rate and rhythm, no murmurs. Abdomen: Obese, soft, nontender.  Normoactive bowel sounds.  No masses or hepatosplenomegaly appreciated.  Well-healed incisions. Extremities: Grossly normal range of motion.  Warm, well perfused.  No edema bilaterally. Skin: No rashes or lesions noted. Lymphatics: No cervical, supraclavicular, or inguinal adenopathy. GU: Normal appearing external genitalia without erythema, excoriation, or lesions.  Speculum exam reveals mildly atrophic vaginal mucosa, no lesions or masses.  No bleeding or discharge.  Bimanual exam reveals cuff intact, smooth, no nodularity.  Rectovaginal exam confirms findings.  Laboratory & Radiologic Studies: CT A/P on 12/04/20: Vascular/Lymphatic: 1.4 cm left paraaortic lymph node on image 31/2 remains stable. Retroperitoneal lymphadenopathy in the portacaval space measuring 3.5 x 5.6 cm, mildly increased from 3.1 x 5.0 cm previously. No acute vascular findings. Aortic atherosclerotic calcification noted.  Component Ref Range & Units 2 mo ago (12/04/20) 6 mo ago (08/24/20) 7 mo ago (07/25/20) 9 mo ago (05/30/20) 10 mo ago (04/25/20) 10 mo ago (04/09/20) 1 yr ago (02/09/20)  Cancer Antigen (CA) 125 0.0 - 38.1 U/mL 20.6  31.4 CM  44.3 High  CM  58.9 High  CM  39.5 High  CM  30.1 CM  17.8 CM    Assessment & Plan: Laura [name] is a 76 y.o. 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 This was treated with neoadjuvant chemotherapy, surgery in December, 2021 showing complete pathologic response. Carboplatin and paclitaxel x 6 completed on 11/17/19.   Elevation in CA 125 concerning for recurrence.  CT suggests location of recurrence is the para-aortic region.  Now s/p SBRT to the para-aortic lymph nodes completed 01/2021.   Given  mild increased in  retroperitoneal adenopathy as well as increased right hydronephrosis secondary to adenopathy, recommendation was made to proceed with treatment.  Patient has now completed stereotactic radiation to the enlarged retroperitoneal lymph nodes.  She had some significant difficulty per her report tolerating treatment but has completed treatment and is slowly recovering.  She has posttreatment imaging scheduled for the end of March.  She otherwise is without evidence of disease today.  I will see her back in 3 months. She is scheduled to see Dr. Sondra Come on 2/20 and Dr. Tera Helper on 4/4, shortly after her follow-up CT imaging.  32 minutes of total time was spent for this patient encounter, including preparation, face-to-face counseling with the patient and coordination of care, and documentation of the encounter.  Jeral Pinch, MD  Division of Gynecologic Oncology  Department of Obstetrics and Gynecology  [facility]

## 2021-02-26 ENCOUNTER — Encounter (HOSPITAL_COMMUNITY): Payer: Self-pay | Admitting: Hematology

## 2021-03-01 NOTE — Progress Notes (Addendum)
Laura [name] is here today for follow up post radiation to the abdomen.  They completed their radiation on: 02/01/2021   Does the patient complain of any of the following:  Pain: No  Abdominal bloating: yes Diarrhea/Constipation: Constipation  Nausea/Vomiting: no Blood in Urine or Stool: no Urinary Issues (dysuria/incomplete emptying/ incontinence/ increased frequency/urgency): Patient reports incomplete bladder emptying, and weak urine stream. Nocturia getting up 4-6 times per night. Reports pressure to lower abdomen prior to urinating.  Post radiation skin changes: No    Additional comments if applicable:  Patient reports numbness to both hands from palm to finger tips. Patient reports having drop in blood sugars after each treatment. Patient reports having a hard time dealing with diagnosis. Offered to set patient up with social work. Patient to call when she is ready to see Education officer, museum.   Vitals:   03/04/21 1140  BP: (!) 179/68  Pulse: 85  Resp: 18  Temp: (!) 96.2 F (35.7 C)  TempSrc: Temporal  SpO2: 100%  Weight: 194 lb 6 oz (88.2 kg)  Height: 5\' 3"  (1.6 m)

## 2021-03-03 NOTE — Progress Notes (Incomplete)
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 A diagnosis of Secondary malignant neoplasm of intra-abdominal lymph nodes ([place]) was also pertinent to this visit.   Recurrent ovarian Cancer   Intent: Curative  Radiation Treatment Dates: 01/21/2021 through 02/01/2021 Site Technique Total Dose (Gy) Dose per Fx (Gy) Completed Fx Beam Energies  Abdomen: Abd IMRT 40/40 4 10/10 [id number]   Narrative: The patient tolerated radiation therapy relatively well. She reported weakness, mild lower back and right lower lateral abdominal aching, fatigue, diarrhea, mild nausea without emesis, and poor appetite. Physical exam performed during her final weekly treatment check on 01/29/21 revealed the abdomen as soft and nontender with normal bowel sounds.   Plan: The patient will follow-up with radiation oncology in one month .  ________________________________________________ -----------------------------------  Blair Promise, PhD, MD  This document serves as a record of services personally performed by Gery Pray, MD. It was created on his behalf by Roney Mans, a trained medical scribe. The creation of this record is based on the scribe's personal observations and the provider's statements to them. This document has been checked and approved by the attending provider.

## 2021-03-03 NOTE — Progress Notes (Signed)
Radiation Oncology         [phone] ________________________________  Name: Laura [name] MRN: [id number]  Date: 03/04/2021  DOB: [birth year]  Follow-Up Visit Note  CC: Biagio Borg, MD  Derek Jack, MD    ICD-10-CM   1. 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 Retroperitoneal mass  R19.00       Diagnosis:  The primary encounter diagnosis was Malignant neoplasm of both ovaries ([place]). A diagnosis of Secondary malignant neoplasm of intra-abdominal lymph nodes ([place]) was also pertinent to this visit.   Recurrent ovarian Cancer   Interval Since Last Radiation: 1 month   Intent: Curative  Radiation Treatment Dates: 01/21/2021 through 02/01/2021 Site Technique Total Dose (Gy) Dose per Fx (Gy) Completed Fx Beam Energies  Abdomen: Abd IMRT 40/40 4 10/10 [id number]    Narrative:  The patient returns today for routine follow-up.  The patient tolerated radiation therapy relatively well other than weakness, mild lower back and right lower lateral abdominal aching, fatigue, diarrhea, mild nausea without emesis, and poor appetite. Physical exam performed during her final weekly treatment check on 01/29/21 revealed the abdomen as soft and nontender with normal bowel sounds.              Since her initial consultation date, the patient followed up with Dr. Berline Lopes on 02/25/21. During which time, the patient reported overall doing well other than the significant fatigue and weakness from RT. She reported a slight increase from baseline of these symptoms and a slow recovery.  She again endorsed a couple episodes of nausea and diarrhea during treatment which had not recurred since, as well as multiple episodes where she would become hypoglycemic during treatment. She otherwise denied any vaginal bleeding, discharge, abdominal pain, or pelvic pain, and reported continued nocturia. Speculum exam performed during this visit  revealed mildly atrophic vaginal  mucosa. All other pelvic findings were normal, and the patient was noted as NED on examination.   On evaluation today she continues to have fatigue.  She denies any further problems with nausea or diarrhea.  She reports nocturia and slow urinary stream.  I discussed that we could obtain a urinary specimen today but she feels not related to an infection.  She will see Dr. Jenny Reichmann her primary care physician later this week for further evaluation.  She reports her pain along the right flank and right lower abdomen area has resolved after receiving her radiation therapy.   Allergies:  is allergic to ciprofloxacin, hydrocodone bit-homatrop mbr, sulfa antibiotics, augmentin [amoxicillin-pot clavulanate], codeine, crestor [rosuvastatin calcium], doxycycline, gabapentin, keflex [cephalexin], naproxen, statins, and prednisone.  Meds: Current Outpatient Medications  Medication Sig Dispense Refill   acetaminophen (TYLENOL) 500 MG tablet Take 1 tablet (500 mg total) by mouth every 6 (six) hours as needed. 30 tablet 0   amLODipine (NORVASC) 5 MG tablet TAKE 1 TABLET (5 MG TOTAL) BY MOUTH DAILY. 90 tablet 3   Biotin 10000 MCG TABS Take by mouth.     Blood Glucose Monitoring Suppl (ONE TOUCH ULTRA 2) w/Device KIT Use as directed 1 each 0   Cholecalciferol (VITAMIN D3) 125 MCG (5000 UT) TABS Take 10,000-15,000 Units by mouth daily.     Eszopiclone 3 MG TABS Take 1 tablet (3 mg total) by mouth at bedtime. Take immediately before bedtime (Patient taking differently: Take 3 mg by mouth at bedtime. Take immediately before bedtime Per patient Prn) 90 tablet 1   famotidine (PEPCID) 20  MG tablet Take 20 mg by mouth 2 (two) times daily as needed for heartburn or indigestion.     feeding supplement (BOOST HIGH PROTEIN) LIQD Take 237 mLs by mouth daily.     glimepiride (AMARYL) 2 MG tablet Take 1 tablet (2 mg total) by mouth daily before breakfast. 90 tablet 3   irbesartan (AVAPRO) 75 MG tablet Take 1 tablet (75 mg total)  by mouth daily. 90 tablet 3   Lancets (ONETOUCH ULTRASOFT) lancets USE TO CHECK BLOOD SUGAR TWICE DAILY 100 each 3   loperamide (IMODIUM) 2 MG capsule Take 2 mg by mouth as needed for diarrhea or loose stools.      loratadine (CLARITIN) 10 MG tablet Take 10 mg by mouth daily as needed (allergies.).     LORazepam (ATIVAN) 0.5 MG tablet Take 1 tablet (0.5 mg total) by mouth 2 (two) times daily as needed for anxiety. 60 tablet 1   Multiple Vitamins-Minerals (AIRBORNE PO) Take by mouth.     ONETOUCH ULTRA test strip USE TO CHECK BLOOD SUGARS TWO TIMES DAILY 100 strip 3   solifenacin (VESICARE) 5 MG tablet Take 5 mg by mouth as needed.     vitamin B-12 (CYANOCOBALAMIN) 1000 MCG tablet Take 1,000 mcg by mouth daily.      morphine (MSIR) 15 MG tablet Take 1 tablet (15 mg total) by mouth every 6 (six) hours as needed for severe pain. 10 tablet 0   OVER THE COUNTER MEDICATION Apply 1 application topically daily as needed (pain). Triderma otc pain cream     prochlorperazine (COMPAZINE) 10 MG tablet TAKE 1 TABLET (10 MG TOTAL) BY MOUTH EVERY 6 (SIX) HOURS AS NEEDED (NAUSEA OR VOMITING). (Patient not taking: Reported on 03/04/2021) 60 tablet 3   No current facility-administered medications for this encounter.    Physical Findings: The patient is in no acute distress. Patient is alert and oriented.  height is 5' 3"  (1.6 m) and weight is 194 lb 6 oz (88.2 kg). Her temporal temperature is 96.2 F (35.7 C) (abnormal). Her blood pressure is 179/68 (abnormal) and her pulse is 85. Her respiration is 18 and oxygen saturation is 100%. .   Lungs are clear to auscultation bilaterally. Heart has regular rate and rhythm. No palpable cervical, supraclavicular, or axillary adenopathy. Abdomen soft, non-tender, normal bowel sounds.    Lab Findings: Lab Results  Component Value Date   WBC 8.5 12/04/2020   HGB 12.1 12/04/2020   HCT 36.2 12/04/2020   MCV 89.8 12/04/2020   PLT 208 12/04/2020    Radiographic  Findings: No results found.  Impression: The primary encounter diagnosis was Malignant neoplasm of both ovaries ([place]). A diagnosis of Secondary malignant neoplasm of intra-abdominal lymph nodes ([place]) was also pertinent to this visit.   Recurrent ovarian Cancer    The patient is recovering from the effects of radiation.  She still has significant fatigue at this point but hopefully over time this will improve.  Her pain in the right flank and right lower abdomen has resolved with her radiation therapy suggesting tumor response.  Plan: As needed follow-up in radiation oncology.  She will see Dr. Jenny Reichmann later this week.  She also has a follow-up CT scan scheduled in late March and will follow-up with Dr. Delton Coombes with the results.  She will continue periodic follow-up with Dr. Berline Lopes in addition.    ____________________________________  Blair Promise, PhD, MD   This document serves as a record of services personally performed by Gery Pray,  MD. It was created on his behalf by Roney Mans, a trained medical scribe. The creation of this record is based on the scribe's personal observations and the provider's statements to them. This document has been checked and approved by the attending provider.

## 2021-03-04 ENCOUNTER — Ambulatory Visit
Admission: RE | Admit: 2021-03-04 | Discharge: 2021-03-04 | Disposition: A | Discharge status: Home / Self Care | Payer: Medicare Other | Source: Ambulatory Visit | Attending: Radiation Oncology | Admitting: Radiation Oncology

## 2021-03-04 ENCOUNTER — Other Ambulatory Visit: Payer: Self-pay

## 2021-03-04 ENCOUNTER — Encounter: Payer: Self-pay | Admitting: Radiation Oncology

## 2021-03-04 VITALS — BP 179/68 | HR 85 | Temp 96.2°F | Resp 18 | Ht 63.0 in | Wt 194.4 lb

## 2021-03-04 DIAGNOSIS — C562 Malignant neoplasm of left ovary: Secondary | ICD-10-CM | POA: Insufficient documentation

## 2021-03-04 DIAGNOSIS — R197 Diarrhea, unspecified: Secondary | ICD-10-CM | POA: Diagnosis not present

## 2021-03-04 DIAGNOSIS — C772 Secondary and unspecified malignant neoplasm of intra-abdominal lymph nodes: Secondary | ICD-10-CM | POA: Insufficient documentation

## 2021-03-04 DIAGNOSIS — R351 Nocturia: Secondary | ICD-10-CM | POA: Insufficient documentation

## 2021-03-04 DIAGNOSIS — R110 Nausea: Secondary | ICD-10-CM | POA: Insufficient documentation

## 2021-03-04 DIAGNOSIS — C561 Malignant neoplasm of right ovary: Secondary | ICD-10-CM | POA: Diagnosis not present

## 2021-03-04 DIAGNOSIS — Z7984 Long term (current) use of oral hypoglycemic drugs: Secondary | ICD-10-CM | POA: Diagnosis not present

## 2021-03-04 DIAGNOSIS — Z79899 Other long term (current) drug therapy: Secondary | ICD-10-CM | POA: Insufficient documentation

## 2021-03-04 DIAGNOSIS — R5383 Other fatigue: Secondary | ICD-10-CM | POA: Insufficient documentation

## 2021-03-04 DIAGNOSIS — Z923 Personal history of irradiation: Secondary | ICD-10-CM | POA: Diagnosis not present

## 2021-03-04 DIAGNOSIS — R1900 Intra-abdominal and pelvic swelling, mass and lump, unspecified site: Secondary | ICD-10-CM

## 2021-03-04 DIAGNOSIS — R531 Weakness: Secondary | ICD-10-CM | POA: Diagnosis not present

## 2021-03-06 ENCOUNTER — Ambulatory Visit (INDEPENDENT_AMBULATORY_CARE_PROVIDER_SITE_OTHER): Payer: Medicare Other | Admitting: Internal Medicine

## 2021-03-06 ENCOUNTER — Encounter: Payer: Self-pay | Admitting: Internal Medicine

## 2021-03-06 ENCOUNTER — Other Ambulatory Visit: Payer: Self-pay

## 2021-03-06 ENCOUNTER — Other Ambulatory Visit: Payer: Self-pay | Admitting: Internal Medicine

## 2021-03-06 VITALS — BP 130/60 | HR 84 | Resp 18 | Ht 63.0 in | Wt 194.6 lb

## 2021-03-06 DIAGNOSIS — F5101 Primary insomnia: Secondary | ICD-10-CM

## 2021-03-06 DIAGNOSIS — E119 Type 2 diabetes mellitus without complications: Secondary | ICD-10-CM | POA: Diagnosis not present

## 2021-03-06 DIAGNOSIS — E538 Deficiency of other specified B group vitamins: Secondary | ICD-10-CM

## 2021-03-06 DIAGNOSIS — R5383 Other fatigue: Secondary | ICD-10-CM

## 2021-03-06 DIAGNOSIS — C569 Malignant neoplasm of unspecified ovary: Secondary | ICD-10-CM

## 2021-03-06 DIAGNOSIS — E559 Vitamin D deficiency, unspecified: Secondary | ICD-10-CM

## 2021-03-06 DIAGNOSIS — I700 Atherosclerosis of aorta: Secondary | ICD-10-CM | POA: Diagnosis not present

## 2021-03-06 DIAGNOSIS — N318 Other neuromuscular dysfunction of bladder: Secondary | ICD-10-CM | POA: Diagnosis not present

## 2021-03-06 DIAGNOSIS — Z0001 Encounter for general adult medical examination with abnormal findings: Secondary | ICD-10-CM | POA: Diagnosis not present

## 2021-03-06 DIAGNOSIS — R339 Retention of urine, unspecified: Secondary | ICD-10-CM

## 2021-03-06 DIAGNOSIS — N179 Acute kidney failure, unspecified: Secondary | ICD-10-CM

## 2021-03-06 LAB — CBC WITH DIFFERENTIAL/PLATELET
Basophils Absolute: 0 10*3/uL (ref 0.0–0.1)
Basophils Relative: 0.4 % (ref 0.0–3.0)
Eosinophils Absolute: 0.1 10*3/uL (ref 0.0–0.7)
Eosinophils Relative: 1.8 % (ref 0.0–5.0)
HCT: 33.3 % — ABNORMAL LOW (ref 36.0–46.0)
Hemoglobin: 11.2 g/dL — ABNORMAL LOW (ref 12.0–15.0)
Lymphocytes Relative: 18.3 % (ref 12.0–46.0)
Lymphs Abs: 1.1 10*3/uL (ref 0.7–4.0)
MCHC: 33.5 g/dL (ref 30.0–36.0)
MCV: 89.9 fl (ref 78.0–100.0)
Monocytes Absolute: 0.4 10*3/uL (ref 0.1–1.0)
Monocytes Relative: 7 % (ref 3.0–12.0)
Neutro Abs: 4.4 10*3/uL (ref 1.4–7.7)
Neutrophils Relative %: 72.5 % (ref 43.0–77.0)
Platelets: 199 10*3/uL (ref 150.0–400.0)
RBC: 3.7 Mil/uL — ABNORMAL LOW (ref 3.87–5.11)
RDW: 14.6 % (ref 11.5–15.5)
WBC: 6.1 10*3/uL (ref 4.0–10.5)

## 2021-03-06 LAB — BASIC METABOLIC PANEL
BUN: 32 mg/dL — ABNORMAL HIGH (ref 6–23)
CO2: 28 mEq/L (ref 19–32)
Calcium: 10 mg/dL (ref 8.4–10.5)
Chloride: 106 mEq/L (ref 96–112)
Creatinine, Ser: 2.3 mg/dL — ABNORMAL HIGH (ref 0.40–1.20)
GFR: 20.17 mL/min — ABNORMAL LOW (ref >60.00)
Glucose, Bld: 99 mg/dL (ref 70–99)
Potassium: 4.5 mEq/L (ref 3.5–5.1)
Sodium: 138 mEq/L (ref 135–145)

## 2021-03-06 LAB — LIPID PANEL
Cholesterol: 237 mg/dL — ABNORMAL HIGH (ref 0–200)
HDL: 49.2 mg/dL (ref >39.00)
LDL Cholesterol: 154 mg/dL — ABNORMAL HIGH (ref 0–99)
NonHDL: 188.28
Total CHOL/HDL Ratio: 5
Triglycerides: 169 mg/dL — ABNORMAL HIGH (ref 0.0–149.0)
VLDL: 33.8 mg/dL (ref 0.0–40.0)

## 2021-03-06 LAB — MICROALBUMIN / CREATININE URINE RATIO
Creatinine,U: 110.6 mg/dL
Microalb Creat Ratio: 1.9 mg/g (ref 0.0–30.0)
Microalb, Ur: 2.1 mg/dL — ABNORMAL HIGH (ref 0.0–1.9)

## 2021-03-06 LAB — VITAMIN D 25 HYDROXY (VIT D DEFICIENCY, FRACTURES): VITD: 49.13 ng/mL (ref 30.00–100.00)

## 2021-03-06 LAB — HEPATIC FUNCTION PANEL
ALT: 11 U/L (ref 0–35)
AST: 13 U/L (ref 0–37)
Albumin: 4.2 g/dL (ref 3.5–5.2)
Alkaline Phosphatase: 58 U/L (ref 39–117)
Bilirubin, Direct: 0 mg/dL (ref 0.0–0.3)
Total Bilirubin: 0.4 mg/dL (ref 0.2–1.2)
Total Protein: 6.9 g/dL (ref 6.0–8.3)

## 2021-03-06 LAB — VITAMIN B12: Vitamin B-12: 681 pg/mL (ref 211–911)

## 2021-03-06 LAB — TSH: TSH: 1.38 u[IU]/mL (ref 0.35–5.50)

## 2021-03-06 LAB — HEMOGLOBIN A1C: Hgb A1c MFr Bld: 6.6 % — ABNORMAL HIGH (ref 4.6–6.5)

## 2021-03-06 NOTE — Progress Notes (Signed)
Patient ID: Laura [name], female   DOB: 05/23/1945, 76 y.o.   MRN: [id number]         Chief Complaint:: wellness exam and 6 month f/u (Pt has been feeling really fatigued lately due to her radiation treatment. ) And urinary difficulty getting started , low vit d, dm, fatigue       HPI:  Laura [name] is a 76 y.o. female here for wellness exam; plans to f/u with optho soon in the next 1-2 mo.; declines covid booster, shingrix, o/w up to date                        Also s/p 10 x XRT treatments, last tx jan 20, now with significant fatigue.  Has f/u CT scan for next month but most recently thought to be cured per pt.  Also with recent difficulty with getting to sleep and staying asleep better with current med, but has had nocturia x 6 but this is now improved with vesicare x 3 days restart    Has been off for some weeks, but now back to taking the last 3 days. Has some urinary retention as well it seems with some straining to start.  Nausea ok on current antiemetic post xrt, no vomiting, feels like she is getting adequate fluids.  Pt denies chest pain, increased sob or doe, wheezing, orthopnea, PND, increased LE swelling, palpitations, dizziness or syncope.   Pt denies polydipsia, polyuria, but has had several recent low sugar symptoms with weakness when not eating prior to the XRT treatments and taking her OHAs.  Wt has remained overall stable.  Not taking Vit D.     Wt Readings from Last 3 Encounters:  03/06/21 194 lb 9.6 oz (88.3 kg)  03/04/21 194 lb 6 oz (88.2 kg)  02/25/21 193 lb 3.2 oz (87.6 kg)   BP Readings from Last 3 Encounters:  03/06/21 130/60  03/04/21 (!) 179/68  02/25/21 134/81   Immunization History  Administered Date(s) Administered   Fluad Quad(high Dose 65+) 11/22/2018, 10/11/2019, 11/30/2020   Influenza Split 01/06/2011, 10/22/2011   Influenza, High Dose Seasonal PF 12/07/2012, 11/17/2013, 11/17/2013, 12/12/2015, 12/01/2016, 11/19/2017   Pneumococcal Conjugate-13 02/15/2013    Pneumococcal Polysaccharide-23 01/19/2006, 01/17/2008, 02/23/2017   Td 01/15/2004   Tdap 06/29/2015   There are no preventive care reminders to display for this patient.     Past Medical History:  Diagnosis Date   Anemia    Anxiety    Arthritis    back of neck, bones spurs on neck   Cancer (HCC)    ovarian cancer   Cataract    Cervical disc disease    Diabetes mellitus    Family history of thyroid cancer    Heart murmur    History of radiation therapy    abdominal lymph nodes 01/21/2021-02/01/2021  Dr Gery Pray   Hydronephrosis 08/24/2020   Right Kidney (stable per CT)   Hyperlipidemia    Hypertension    Mucoid cyst of joint    right thumb   Neuropathy    Port-A-Cath in place 07/21/2019   Reflux    Sleep apnea    wears CPAP nightly   Vertigo    Past Surgical History:  Procedure Laterality Date   ABDOMINAL HYSTERECTOMY     vaginal   BLADDER SURGERY     BREAST EXCISIONAL BIOPSY Bilateral    BREAST SURGERY     CHOLECYSTECTOMY     COLONOSCOPY  fibroids removed     breast (both breasts)   IR IMAGING GUIDED PORT INSERTION  07/26/2019   right   MASS EXCISION Right 06/26/2016   Procedure: EXCISION MUCOID TUMOR RIGHT THUMB, IP RIGHT THUMB;  Surgeon: Daryll Brod, MD;  Location: [facility];  Service: Orthopedics;  Laterality: Right;   PARTIAL HYSTERECTOMY     ROBOTIC ASSISTED BILATERAL SALPINGO OOPHERECTOMY N/A 01/03/2020   Procedure: XI ROBOTIC ASSISTED BILATERAL SALPINGO OOPHORECTOMY, RADICAL TUMOR DEBULKING;  Surgeon: Everitt Amber, MD;  Location: [facility];  Service: Gynecology;  Laterality: N/A;   ROBOTIC PELVIC AND PARA-AORTIC LYMPH NODE DISSECTION N/A 01/03/2020   Procedure: XI ROBOTIC PARA-AORTIC LYMPHADENECTOMY;  Surgeon: Everitt Amber, MD;  Location: [facility];  Service: Gynecology;  Laterality: N/A;    reports that she has never smoked. She has never used smokeless tobacco. She reports that she does not drink alcohol and does not use drugs. family  history includes Breast cancer in her sister; Diabetes in her father, maternal aunt, and sister; Hypertension in her father, mother, sister, and another family member; Stroke in her sister; Thyroid cancer (age of onset: 75) in her daughter. Allergies  Allergen Reactions   Ciprofloxacin Swelling    Torn tendon   Hydrocodone Bit-Homatrop Mbr Other (See Comments)    Vertigo *pt strongly prefers to never take*   Sulfa Antibiotics Hives, Itching and Swelling    Tongue swells   Augmentin [Amoxicillin-Pot Clavulanate]     Diarrhea; can take PCN/ Amoxicillin   Codeine Itching   Crestor [Rosuvastatin Calcium] Other (See Comments)    Did something to memory     Doxycycline Other (See Comments)    Severe rectal Gas.   Gabapentin     disoriented   Keflex [Cephalexin] Diarrhea and Nausea And Vomiting   Naproxen Other (See Comments)    Stomach cramps   Statins     Muscle weakness   Prednisone Anxiety    *pt strongly prefers to never be given prednisone*    Current Outpatient Medications on File Prior to Visit  Medication Sig Dispense Refill   acetaminophen (TYLENOL) 500 MG tablet Take 1 tablet (500 mg total) by mouth every 6 (six) hours as needed. 30 tablet 0   amLODipine (NORVASC) 5 MG tablet TAKE 1 TABLET (5 MG TOTAL) BY MOUTH DAILY. 90 tablet 3   Biotin 10000 MCG TABS Take by mouth.     Blood Glucose Monitoring Suppl (ONE TOUCH ULTRA 2) w/Device KIT Use as directed 1 each 0   Cholecalciferol (VITAMIN D3) 125 MCG (5000 UT) TABS Take 10,000-15,000 Units by mouth daily.     Eszopiclone 3 MG TABS Take 1 tablet (3 mg total) by mouth at bedtime. Take immediately before bedtime (Patient taking differently: Take 3 mg by mouth at bedtime. Take immediately before bedtime Per patient Prn) 90 tablet 1   famotidine (PEPCID) 20 MG tablet Take 20 mg by mouth 2 (two) times daily as needed for heartburn or indigestion.     feeding supplement (BOOST HIGH PROTEIN) LIQD Take 237 mLs by mouth daily.      glimepiride (AMARYL) 2 MG tablet Take 1 tablet (2 mg total) by mouth daily before breakfast. 90 tablet 3   irbesartan (AVAPRO) 75 MG tablet Take 1 tablet (75 mg total) by mouth daily. 90 tablet 3   Lancets (ONETOUCH ULTRASOFT) lancets USE TO CHECK BLOOD SUGAR TWICE DAILY 100 each 3   loperamide (IMODIUM) 2 MG capsule Take 2 mg by mouth as needed for diarrhea  or loose stools.      loratadine (CLARITIN) 10 MG tablet Take 10 mg by mouth daily as needed (allergies.).     LORazepam (ATIVAN) 0.5 MG tablet Take 1 tablet (0.5 mg total) by mouth 2 (two) times daily as needed for anxiety. 60 tablet 1   Multiple Vitamins-Minerals (AIRBORNE PO) Take by mouth.     ONETOUCH ULTRA test strip USE TO CHECK BLOOD SUGARS TWO TIMES DAILY 100 strip 3   prochlorperazine (COMPAZINE) 10 MG tablet TAKE 1 TABLET (10 MG TOTAL) BY MOUTH EVERY 6 (SIX) HOURS AS NEEDED (NAUSEA OR VOMITING). 60 tablet 3   solifenacin (VESICARE) 5 MG tablet Take 5 mg by mouth as needed.     vitamin B-12 (CYANOCOBALAMIN) 1000 MCG tablet Take 1,000 mcg by mouth daily.      No current facility-administered medications on file prior to visit.        ROS:  All others reviewed and negative.  Objective        PE:  BP 130/60    Pulse 84    Resp 18    Ht 5' 3"  (1.6 m)    Wt 194 lb 9.6 oz (88.3 kg)    SpO2 98%    BMI 34.47 kg/m                 Constitutional: Pt appears in NAD               HENT: Head: NCAT.                Right Ear: External ear normal.                 Left Ear: External ear normal.                Eyes: . Pupils are equal, round, and reactive to light. Conjunctivae and EOM are normal               Nose: without d/c or deformity               Neck: Neck supple. Gross normal ROM               Cardiovascular: Normal rate and regular rhythm.                 Pulmonary/Chest: Effort normal and breath sounds without rales or wheezing.                Abd:  Soft, NT, ND, + BS, no organomegaly               Neurological: Pt is alert. At  baseline orientation, motor grossly intact               Skin: Skin is warm. No rashes, no other new lesions, LE edema - none               Psychiatric: Pt behavior is normal without agitation   Micro: none  Cardiac tracings I have personally interpreted today:  none  Pertinent Radiological findings (summarize): none   Lab Results  Component Value Date   WBC 6.1 03/06/2021   HGB 11.2 (L) 03/06/2021   HCT 33.3 (L) 03/06/2021   PLT 199.0 03/06/2021   GLUCOSE 99 03/06/2021   CHOL 237 (H) 03/06/2021   TRIG 169.0 (H) 03/06/2021   HDL 49.20 03/06/2021   LDLDIRECT 172.0 02/28/2020   LDLCALC 154 (H) 03/06/2021   ALT 11 03/06/2021   AST 13 03/06/2021  NA 138 03/06/2021   K 4.5 03/06/2021   CL 106 03/06/2021   CREATININE 2.30 (H) 03/06/2021   BUN 32 (H) 03/06/2021   CO2 28 03/06/2021   TSH 1.38 03/06/2021   INR 1.0 06/06/2019   HGBA1C 6.6 (H) 03/06/2021   MICROALBUR 2.1 (H) 03/06/2021   Assessment/Plan:  Laura [name] is a 76 y.o. Black or African American [2] female with  has a past medical history of Anemia, Anxiety, Arthritis, Cancer ([place]), Cataract, Cervical disc disease, Diabetes mellitus, Family history of thyroid cancer, Heart murmur, History of radiation therapy, Hydronephrosis (08/24/2020), Hyperlipidemia, Hypertension, Mucoid cyst of joint, Neuropathy, Port-A-Cath in place (07/21/2019), Reflux, Sleep apnea, and Vertigo.  Vitamin D deficiency Last vitamin D Lab Results  Component Value Date   VD25OH 51.43 02/28/2020   Stable, cont oral replacement   Diabetes mellitus type 2, noninsulin dependent ([place]) Has been mild overcontrolled recently with XRT treatments, for a1c with labs today, consider d/c amaryl if overly well controlled  Encounter for well adult exam with abnormal findings Age and sex appropriate education and counseling updated with regular exercise and diet Referrals for preventative services - none needed Immunizations addressed - declines covid  booster, shingirx Smoking counseling  - none needed Evidence for depression or other mood disorder - none significant Most recent labs reviewed. I have personally reviewed and have noted: 1) the patient's medical and social history 2) The patient's current medications and supplements 3) The patient's height, weight, and BMI have been recorded in the chart   OVERACTIVE BLADDER Uncontrolled but with some sujective improvement in last 3 days - for continue restart vesicare 5 qd  Insomnia Uncontrolled but most recently now starting to improved with restart lunesta qhs prn, pt to continue  Aortic atherosclerosis (HCC) For lower chol diet, exercise, declines statin for now  Ovarian cancer ([place]) To cont f/u CT as planned  Fatigue etilogy unclear, afeb, non toxic appearing, possibly related to very recent XRT tx, for labs as ordered,  to f/u any worsening symptoms or concerns  Followup: No follow-ups on file.  Cathlean Cower, MD 03/06/2021 8:55 PM [facility]

## 2021-03-06 NOTE — Assessment & Plan Note (Addendum)
Uncontrolled but most recently now starting to improved with restart lunesta qhs prn, pt to continue

## 2021-03-06 NOTE — Assessment & Plan Note (Signed)
Last vitamin D Lab Results  Component Value Date   VD25OH 51.43 02/28/2020   Stable, cont oral replacement

## 2021-03-06 NOTE — Assessment & Plan Note (Signed)
For lower chol diet, exercise, declines statin for now

## 2021-03-06 NOTE — Patient Instructions (Addendum)
Please remember to see your eye doctor as you can soon.    Please continue all other medications as before, and refills have been done if requested.  Please have the pharmacy call with any other refills you may need.  Please continue your efforts at being more active, low cholesterol diet, and weight control.  You are otherwise up to date with prevention measures today.  Please keep your appointments with your specialists as you may have planned  Please go to the LAB at the blood drawing area for the tests to be done  You will be contacted by phone if any changes need to be made immediately.  Otherwise, you will receive a letter about your results with an explanation, but please check with MyChart first.  Please remember to sign up for MyChart if you have not done so, as this will be important to you in the future with finding out test results, communicating by private email, and scheduling acute appointments online when needed.  Please make an Appointment to return in 6 months, or sooner if needed

## 2021-03-06 NOTE — Assessment & Plan Note (Signed)
etilogy unclear, afeb, non toxic appearing, possibly related to very recent XRT tx, for labs as ordered,  to f/u any worsening symptoms or concerns

## 2021-03-06 NOTE — Assessment & Plan Note (Signed)
Age and sex appropriate education and counseling updated with regular exercise and diet Referrals for preventative services - none needed Immunizations addressed - declines covid booster, shingirx Smoking counseling  - none needed Evidence for depression or other mood disorder - none significant Most recent labs reviewed. I have personally reviewed and have noted: 1) the patient's medical and social history 2) The patient's current medications and supplements 3) The patient's height, weight, and BMI have been recorded in the chart

## 2021-03-06 NOTE — Assessment & Plan Note (Signed)
Has been mild overcontrolled recently with XRT treatments, for a1c with labs today, consider d/c amaryl if overly well controlled

## 2021-03-06 NOTE — Assessment & Plan Note (Signed)
Uncontrolled but with some sujective improvement in last 3 days - for continue restart vesicare 5 qd

## 2021-03-06 NOTE — Assessment & Plan Note (Signed)
To cont f/u CT as planned

## 2021-03-07 NOTE — Progress Notes (Signed)
No need for a new medication for sugar right now, as her a1c most recently was very good, so stopping this will only lead to the sugars going up only very mildy - ok to watch for now, but if the a1c gets over 7 - 7.5, we would need to consider other medication

## 2021-03-07 NOTE — Progress Notes (Signed)
Called patient in reference to her diabetes and stopping her medication. Patient verbalize understanding. No further questions

## 2021-03-11 ENCOUNTER — Other Ambulatory Visit: Payer: Self-pay | Admitting: Internal Medicine

## 2021-03-11 ENCOUNTER — Ambulatory Visit
Admission: RE | Admit: 2021-03-11 | Discharge: 2021-03-11 | Disposition: A | Discharge status: Home / Self Care | Payer: Medicare Other | Source: Ambulatory Visit | Attending: Internal Medicine | Admitting: Internal Medicine

## 2021-03-11 ENCOUNTER — Telehealth: Payer: Self-pay | Admitting: Internal Medicine

## 2021-03-11 ENCOUNTER — Encounter (HOSPITAL_COMMUNITY): Payer: Self-pay | Admitting: Hematology

## 2021-03-11 DIAGNOSIS — N1330 Unspecified hydronephrosis: Secondary | ICD-10-CM | POA: Diagnosis not present

## 2021-03-11 DIAGNOSIS — N179 Acute kidney failure, unspecified: Secondary | ICD-10-CM

## 2021-03-11 NOTE — Telephone Encounter (Signed)
Unable to reach patient or leave a voicemail regarding message below from provider

## 2021-03-11 NOTE — Telephone Encounter (Signed)
Pt has been referred to renal who has screened the referral  Their initial advice is to d/c the ARB and get stat renal u/s to r/o hydronephrosis  (worsening swelling around the kidneys)  Please to contact pt -   Stop the avapro (irbesartan) for now  We will order the renal u/s stat, and may need urology if there is worsening swelling around the kidneys

## 2021-03-12 ENCOUNTER — Other Ambulatory Visit: Payer: Self-pay

## 2021-03-12 ENCOUNTER — Other Ambulatory Visit: Payer: Self-pay | Admitting: Urology

## 2021-03-12 ENCOUNTER — Encounter (HOSPITAL_BASED_OUTPATIENT_CLINIC_OR_DEPARTMENT_OTHER): Payer: Self-pay | Admitting: Urology

## 2021-03-12 DIAGNOSIS — N170 Acute kidney failure with tubular necrosis: Secondary | ICD-10-CM | POA: Diagnosis not present

## 2021-03-12 DIAGNOSIS — N130 Hydronephrosis with ureteropelvic junction obstruction: Secondary | ICD-10-CM | POA: Diagnosis not present

## 2021-03-12 DIAGNOSIS — G4730 Sleep apnea, unspecified: Secondary | ICD-10-CM

## 2021-03-12 DIAGNOSIS — G629 Polyneuropathy, unspecified: Secondary | ICD-10-CM

## 2021-03-12 HISTORY — DX: Sleep apnea, unspecified: G47.30

## 2021-03-12 HISTORY — DX: Polyneuropathy, unspecified: G62.9

## 2021-03-12 NOTE — Progress Notes (Addendum)
Spoke w/ via phone for pre-op interview---pt Lab needs dos----   [place], ekg            Lab results------echo 04-18-2014 epic, lov dr Sondra Come 02-25-2021 epic, US doppler carotid 04-19-2014 epic COVID test -----patient states asymptomatic no test needed Arrive at -------1015 am 03-13-2021 NPO after MN NO Solid Food.  Clear liquids from MN until---915 am  Med rec completed Medications to take morning of surgery -----Amlodipine, Lorazepam prn, pepcid ac, Imodium prn Diabetic medication -----none day of surgery Patient instructed no nail polish to be worn day of surgery, pt to remove purple acrylic nail polish from 1 finger each hand Patient instructed to bring photo id and insurance card day of surgery Patient aware to have Driver (ride ) / caregiver  pt aware driver/caregiver needed and will arrange will be friend frank or sister laura  for 24 hours after surgery  Patient Special Instructions -----none Pre-Op special Istructions -----none Patient verbalized understanding of instructions that were given at this phone interview. Patient denies shortness of breath, chest pain, fever, cough at this phone interview.

## 2021-03-13 ENCOUNTER — Encounter (HOSPITAL_BASED_OUTPATIENT_CLINIC_OR_DEPARTMENT_OTHER): Payer: Self-pay | Admitting: Urology

## 2021-03-13 ENCOUNTER — Encounter (HOSPITAL_BASED_OUTPATIENT_CLINIC_OR_DEPARTMENT_OTHER)
Admission: RE | Disposition: A | Discharge status: Home / Self Care | Payer: Self-pay | Source: Home / Self Care | Attending: Urology

## 2021-03-13 ENCOUNTER — Ambulatory Visit (HOSPITAL_BASED_OUTPATIENT_CLINIC_OR_DEPARTMENT_OTHER): Payer: Medicare Other | Admitting: Certified Registered Nurse Anesthetist

## 2021-03-13 ENCOUNTER — Ambulatory Visit (HOSPITAL_BASED_OUTPATIENT_CLINIC_OR_DEPARTMENT_OTHER)
Admission: RE | Admit: 2021-03-13 | Discharge: 2021-03-13 | Disposition: A | Discharge status: Home / Self Care | Payer: Medicare Other | Attending: Urology | Admitting: Urology

## 2021-03-13 ENCOUNTER — Other Ambulatory Visit (HOSPITAL_BASED_OUTPATIENT_CLINIC_OR_DEPARTMENT_OTHER): Payer: Self-pay

## 2021-03-13 DIAGNOSIS — E119 Type 2 diabetes mellitus without complications: Secondary | ICD-10-CM | POA: Diagnosis not present

## 2021-03-13 DIAGNOSIS — N179 Acute kidney failure, unspecified: Secondary | ICD-10-CM

## 2021-03-13 DIAGNOSIS — N362 Urethral caruncle: Secondary | ICD-10-CM

## 2021-03-13 DIAGNOSIS — G4730 Sleep apnea, unspecified: Secondary | ICD-10-CM | POA: Diagnosis not present

## 2021-03-13 DIAGNOSIS — N8110 Cystocele, unspecified: Secondary | ICD-10-CM | POA: Diagnosis not present

## 2021-03-13 DIAGNOSIS — Z7984 Long term (current) use of oral hypoglycemic drugs: Secondary | ICD-10-CM | POA: Insufficient documentation

## 2021-03-13 DIAGNOSIS — N1330 Unspecified hydronephrosis: Secondary | ICD-10-CM | POA: Diagnosis not present

## 2021-03-13 DIAGNOSIS — Z79899 Other long term (current) drug therapy: Secondary | ICD-10-CM | POA: Diagnosis not present

## 2021-03-13 DIAGNOSIS — C569 Malignant neoplasm of unspecified ovary: Secondary | ICD-10-CM | POA: Diagnosis not present

## 2021-03-13 DIAGNOSIS — D649 Anemia, unspecified: Secondary | ICD-10-CM | POA: Diagnosis not present

## 2021-03-13 DIAGNOSIS — I10 Essential (primary) hypertension: Secondary | ICD-10-CM | POA: Insufficient documentation

## 2021-03-13 DIAGNOSIS — K219 Gastro-esophageal reflux disease without esophagitis: Secondary | ICD-10-CM | POA: Diagnosis not present

## 2021-03-13 HISTORY — DX: Family history of other specified conditions: Z84.89

## 2021-03-13 HISTORY — DX: Presence of dental prosthetic device (complete) (partial): Z97.2

## 2021-03-13 HISTORY — DX: Irritable bowel syndrome, unspecified: K58.9

## 2021-03-13 HISTORY — PX: CYSTOSCOPY W/ URETERAL STENT PLACEMENT: SHX1429

## 2021-03-13 HISTORY — DX: Pain in left shoulder: M25.512

## 2021-03-13 HISTORY — DX: Gastro-esophageal reflux disease without esophagitis: K21.9

## 2021-03-13 HISTORY — DX: Presence of spectacles and contact lenses: Z97.3

## 2021-03-13 LAB — POCT I-STAT, CHEM 8
BUN: 30 mg/dL — ABNORMAL HIGH (ref 8–23)
Calcium, Ion: 1.21 mmol/L (ref 1.15–1.40)
Chloride: 109 mmol/L (ref 98–111)
Creatinine, Ser: 2.1 mg/dL — ABNORMAL HIGH (ref 0.44–1.00)
Glucose, Bld: 127 mg/dL — ABNORMAL HIGH (ref 70–99)
HCT: 33 % — ABNORMAL LOW (ref 36.0–46.0)
Hemoglobin: 11.2 g/dL — ABNORMAL LOW (ref 12.0–15.0)
Potassium: 4.1 mmol/L (ref 3.5–5.1)
Sodium: 138 mmol/L (ref 135–145)
TCO2: 20 mmol/L — ABNORMAL LOW (ref 22–32)

## 2021-03-13 LAB — GLUCOSE, CAPILLARY: Glucose-Capillary: 133 mg/dL — ABNORMAL HIGH (ref 70–99)

## 2021-03-13 SURGERY — CYSTOSCOPY, WITH RETROGRADE PYELOGRAM AND URETERAL STENT INSERTION
Anesthesia: General | Site: Ureter | Laterality: Right

## 2021-03-13 MED ORDER — ONDANSETRON HCL 4 MG/2ML IJ SOLN: 4.0000 mg | Freq: Once | INTRAMUSCULAR | Status: DC | PRN

## 2021-03-13 MED ORDER — MIRABEGRON ER 25 MG PO TB24: 25.0000 mg | ORAL_TABLET | Freq: Every day | ORAL | 3 refills | Status: DC

## 2021-03-13 MED ORDER — ONDANSETRON HCL 4 MG/2ML IJ SOLN
INTRAMUSCULAR | Status: DC | PRN
  Administered 2021-03-13: 4 mg via INTRAVENOUS

## 2021-03-13 MED ORDER — PROPOFOL 10 MG/ML IV BOLUS
INTRAVENOUS | Status: AC
Start: 2021-03-13 — End: ?
  Filled 2021-03-13: qty 20

## 2021-03-13 MED ORDER — STERILE WATER FOR IRRIGATION IR SOLN
Status: DC | PRN
Start: 2021-03-13 — End: 2021-03-13
  Administered 2021-03-13: 3000 mL

## 2021-03-13 MED ORDER — ACETAMINOPHEN 500 MG PO TABS
1000.0000 mg | ORAL_TABLET | Freq: Once | ORAL | Status: AC
  Administered 2021-03-13: 1000 mg via ORAL

## 2021-03-13 MED ORDER — IOHEXOL 300 MG/ML  SOLN
INTRAMUSCULAR | Status: DC | PRN
  Administered 2021-03-13 (×2): 10 mL

## 2021-03-13 MED ORDER — FENTANYL CITRATE (PF) 100 MCG/2ML IJ SOLN: 25.0000 ug | INTRAMUSCULAR | Status: DC | PRN

## 2021-03-13 MED ORDER — SODIUM CHLORIDE 0.9 % IR SOLN
Status: DC | PRN
  Administered 2021-03-13: 500 mL

## 2021-03-13 MED ORDER — ACETAMINOPHEN 500 MG PO TABS
ORAL_TABLET | ORAL | Status: AC
  Filled 2021-03-13: qty 2

## 2021-03-13 MED ORDER — FENTANYL CITRATE (PF) 100 MCG/2ML IJ SOLN
INTRAMUSCULAR | Status: AC
  Filled 2021-03-13: qty 2

## 2021-03-13 MED ORDER — FENTANYL CITRATE (PF) 100 MCG/2ML IJ SOLN
INTRAMUSCULAR | Status: DC | PRN
  Administered 2021-03-13: 50 ug via INTRAVENOUS

## 2021-03-13 MED ORDER — ONDANSETRON HCL 4 MG/2ML IJ SOLN
INTRAMUSCULAR | Status: AC
  Filled 2021-03-13: qty 2

## 2021-03-13 MED ORDER — EPHEDRINE 5 MG/ML INJ
INTRAVENOUS | Status: AC
  Filled 2021-03-13: qty 5

## 2021-03-13 MED ORDER — MIRABEGRON ER 50 MG PO TB24
50.0000 mg | ORAL_TABLET | Freq: Every day | ORAL | 3 refills | Status: DC
  Filled 2021-03-13: qty 30, 30d supply, fill #0

## 2021-03-13 MED ORDER — DEXAMETHASONE SODIUM PHOSPHATE 10 MG/ML IJ SOLN
INTRAMUSCULAR | Status: AC
  Filled 2021-03-13: qty 1

## 2021-03-13 MED ORDER — LIDOCAINE 2% (20 MG/ML) 5 ML SYRINGE
INTRAMUSCULAR | Status: DC | PRN
  Administered 2021-03-13: 60 mg via INTRAVENOUS

## 2021-03-13 MED ORDER — ALFUZOSIN HCL ER 10 MG PO TB24
10.0000 mg | ORAL_TABLET | Freq: Every day | ORAL | 3 refills | Status: DC
  Filled 2021-03-13 (×3): qty 30, 30d supply, fill #0

## 2021-03-13 MED ORDER — PROPOFOL 10 MG/ML IV BOLUS
INTRAVENOUS | Status: DC | PRN
Start: 2021-03-13 — End: 2021-03-13
  Administered 2021-03-13: 40 mg via INTRAVENOUS
  Administered 2021-03-13: 150 mg via INTRAVENOUS

## 2021-03-13 MED ORDER — CEFAZOLIN SODIUM-DEXTROSE 2-4 GM/100ML-% IV SOLN
INTRAVENOUS | Status: AC
  Filled 2021-03-13: qty 100

## 2021-03-13 MED ORDER — DEXAMETHASONE SODIUM PHOSPHATE 10 MG/ML IJ SOLN
INTRAMUSCULAR | Status: DC | PRN
Start: 2021-03-13 — End: 2021-03-13
  Administered 2021-03-13: 5 mg via INTRAVENOUS

## 2021-03-13 MED ORDER — CEFAZOLIN SODIUM-DEXTROSE 2-4 GM/100ML-% IV SOLN
2.0000 g | INTRAVENOUS | Status: AC
  Administered 2021-03-13: 2 g via INTRAVENOUS

## 2021-03-13 MED ORDER — EPHEDRINE SULFATE-NACL 50-0.9 MG/10ML-% IV SOSY
PREFILLED_SYRINGE | INTRAVENOUS | Status: DC | PRN
  Administered 2021-03-13: 10 mg via INTRAVENOUS
  Administered 2021-03-13: 5 mg via INTRAVENOUS

## 2021-03-13 MED ORDER — LACTATED RINGERS IV SOLN: INTRAVENOUS | Status: DC

## 2021-03-13 MED ORDER — ALFUZOSIN HCL ER 10 MG PO TB24: 10.0000 mg | ORAL_TABLET | Freq: Every day | ORAL | 3 refills | Status: DC

## 2021-03-13 SURGICAL SUPPLY — 23 items
BAG DRAIN URO-CYSTO SKYTR STRL (DRAIN) ×2 IMPLANT
BAG DRN UROCATH (DRAIN) ×1
BASKET ZERO TIP NITINOL 2.4FR (BASKET) IMPLANT
BSKT STON RTRVL ZERO TP 2.4FR (BASKET)
CATH URET 5FR 28IN OPEN ENDED (CATHETERS) ×2 IMPLANT
CLOTH BEACON ORANGE TIMEOUT ST (SAFETY) ×2 IMPLANT
DRSG TEGADERM 4X4.75 (GAUZE/BANDAGES/DRESSINGS) IMPLANT
EXTRACTOR STONE 1.7FRX115CM (UROLOGICAL SUPPLIES) IMPLANT
GLOVE SURG ENC MOIS LTX SZ6.5 (GLOVE) ×2 IMPLANT
GOWN STRL REUS W/TWL LRG LVL3 (GOWN DISPOSABLE) ×2 IMPLANT
GUIDEWIRE ANG ZIPWIRE 038X150 (WIRE) ×1 IMPLANT
GUIDEWIRE STR DUAL SENSOR (WIRE) ×2 IMPLANT
IV NS IRRIG 3000ML ARTHROMATIC (IV SOLUTION) ×1 IMPLANT
KIT TURNOVER CYSTO (KITS) ×2 IMPLANT
MANIFOLD NEPTUNE II (INSTRUMENTS) ×2 IMPLANT
PACK CYSTO (CUSTOM PROCEDURE TRAY) ×2 IMPLANT
PACKING VAGINAL (PACKING) ×1 IMPLANT
STENT PERCUFLEX 4.8FRX24 (STENTS) ×1 IMPLANT
TRACTIP FLEXIVA PULS ID 200XHI (Laser) IMPLANT
TRACTIP FLEXIVA PULSE ID 200 (Laser)
TUBE CONNECTING 12X1/4 (SUCTIONS) ×2 IMPLANT
TUBING UROLOGY SET (TUBING) ×2 IMPLANT
WATER STERILE IRR 3000ML UROMA (IV SOLUTION) ×1 IMPLANT

## 2021-03-13 NOTE — Op Note (Signed)
Preoperative diagnosis:  ?Right hydronephrosis ?Acute kidney injury  ? ?Postoperative diagnosis:  ?Right hydronephrosis ?Acute kidney injury  ? ?Procedure: ? ?Cystoscopy ?right ureteral stent placement (4.8Fr x 24cm no tether) ?right retrograde pyelography with interpretation  ? ?Surgeon: Jacalyn Lefevre, MD ? ?Anesthesia: General ? ?Complications: None ? ?Intraoperative findings:  ?Normal urethra with small caruncle ?Large grade 3 cystocele ?Right retrograde pyelography demonstrated a transition point within the right ureter consistent with the patient?s known external compression in mid ureter with proximal severe hydronephrosis ? ?EBL: Minimal ? ?Specimens: None ? ?Indication: Laura [name] is a 76 y.o. patient with ovarian cancer and retroperitoneal LAD causing right hydronephrosis and acute kidney injury. After reviewing the management options for treatment, he elected to proceed with the above surgical procedure(s). We have discussed the potential benefits and risks of the procedure, side effects of the proposed treatment, the likelihood of the patient achieving the goals of the procedure, and any potential problems that might occur during the procedure or recuperation. Informed consent has been obtained. ? ?Description of procedure: ? ?The patient was taken to the operating room and general anesthesia was induced.  The patient was placed in the dorsal lithotomy position, prepped and draped in the usual sterile fashion, and preoperative antibiotics were administered. A preoperative time-out was performed.  ? ?Cystourethroscopy was performed.  The patient?s urethra was examined and was normal.The bladder was then systematically examined in its entirety. There was no evidence for any bladder tumors, stones, or other mucosal pathology.   ? ?Attention then turned to the rightureteral orifice and a ureteral catheter was used to intubate the ureteral orifice.  Omnipaque contrast was injected through the ureteral  catheter and a retrograde pyelogram was performed with findings as dictated above. ? ?A 0.38 sensor guidewire was then advanced up the right ureter however resistance was met and it would not advance into the right ureter.   A glide wire was attempted and successfully advanced to the renal  pelvis under fluoroscopic guidance.   The stent was positioned appropriately under fluoroscopic and cystoscopic guidance.  The wire was then removed with an adequate stent curl noted in the renal pelvis as well as in the bladder. ? ?The bladder was then emptied and the procedure ended.  The patient appeared to tolerate the procedure well and without complications.  The patient was able to be awakened and transferred to the recovery unit in satisfactory condition.  ? ? ?Jacalyn Lefevre, M.D.  ?

## 2021-03-13 NOTE — Interval H&P Note (Signed)
History and Physical Interval Note: ? ?03/13/2021 ?11:49 AM ? ?[name]  has presented today for surgery, with the diagnosis of HYDRONEPHROSIS.  The various methods of treatment have been discussed with the patient and family. After consideration of risks, benefits and other options for treatment, the patient has consented to  Procedure(s) with comments: ?[place] (Right) - 30 MINS as a surgical intervention.  The patient's history has been reviewed, patient examined, no change in status, stable for surgery.  I have reviewed the patient's chart and labs.  Questions were answered to the patient's satisfaction.   ? ? ?Juana Haralson D Margie Urbanowicz ? ? ?

## 2021-03-13 NOTE — Transfer of Care (Signed)
Immediate Anesthesia Transfer of Care Note ? ?Patient: Laura [name] ? ?Procedure(s) Performed: CYSTOSCOPY WITH RETROGRADE PYELOGRAM/URETERAL STENT PLACEMENT (Right: Ureter) ? ?Patient Location: PACU ? ?Anesthesia Type:General ? ?Level of Consciousness: awake, alert , oriented and patient cooperative ? ?Airway & Oxygen Therapy: Patient Spontanous Breathing ? ?Post-op Assessment: Report given to RN and Post -op Vital signs reviewed and stable ? ?Post vital signs: Reviewed and stable ? ?Last Vitals:  ?Vitals Value Taken Time  ?BP 140/79 03/13/21 1250  ?Temp    ?Pulse 84 03/13/21 1251  ?Resp 20 03/13/21 1251  ?SpO2 100 % 03/13/21 1251  ?Vitals shown include unvalidated device data. ? ?Last Pain:  ?Vitals:  ? 03/13/21 1057  ?TempSrc: Oral  ?PainSc: 0-No pain  ?   ? ?Patients Stated Pain Goal: 4 (03/13/21 1057) ? ?Complications: No notable events documented. ?

## 2021-03-13 NOTE — Anesthesia Postprocedure Evaluation (Signed)
Anesthesia Post Note ? ?Patient: Laura [name] ? ?Procedure(s) Performed: CYSTOSCOPY WITH RETROGRADE PYELOGRAM/URETERAL STENT PLACEMENT (Right: Ureter) ? ?  ? ?Patient location during evaluation: PACU ?Anesthesia Type: General ?Level of consciousness: awake and alert ?Pain management: pain level controlled ?Vital Signs Assessment: post-procedure vital signs reviewed and stable ?Respiratory status: spontaneous breathing, nonlabored ventilation, respiratory function stable and patient connected to nasal cannula oxygen ?Cardiovascular status: blood pressure returned to baseline and stable ?Postop Assessment: no apparent nausea or vomiting ?Anesthetic complications: no ? ? ?No notable events documented. ? ?Last Vitals:  ?Vitals:  ? 03/13/21 1315 03/13/21 1324  ?BP: (!) 156/69 (!) 150/60  ?Pulse: 77 75  ?Resp: 19 14  ?Temp:  36.5 ?C  ?SpO2: 99% 100%  ?  ?Last Pain:  ?Vitals:  ? 03/13/21 1324  ?TempSrc:   ?PainSc: 0-No pain  ? ? ?  ?  ?  ?  ?  ?  ? ?Suzette Battiest E ? ? ? ? ?

## 2021-03-13 NOTE — Discharge Instructions (Addendum)
Post stent placement instructions   Definitions:  Ureter: The duct that transports urine from the kidney to the bladder. Stent: A plastic hollow tube that is placed into the ureter, from the kidney to the bladder to prevent the ureter from swelling shut.  General instructions:  Despite the fact that no skin incisions were used, the area around the ureter and bladder is raw and irritated. The stent is a foreign body which can further irritate the bladder wall. This irritation is manifested by increased frequency of urination, both day and night, and by an increase in the urge to urinate. In some, the urge to urinate is present almost always. Sometimes the urge is strong enough that you may not be able to stop your self from urinating. This can often be controlled with medication but does not occur in everyone. A stent can safely be left in place for 3 months or greater.  You may see some blood in your urine while the stent is in place and a few days afterward. Do not be alarmed, even if the urine is clear for a while. Get off your feet and drink lots of fluids until clearing occurs. If you start to pass clots or don't improve, call us.  Diet:  You may return to your normal diet immediately. Because of the raw surface of your bladder, alcohol, spicy foods, foods high in acid and drinks with caffeine may cause irritation or frequency and should be used in moderation. To keep your urine flowing freely and avoid constipation, drink plenty of fluids during the day (8-10 glasses). Tip: Avoid cranberry juice because it is very acidic.  Activity:  Your physical activity doesn't need to be restricted. However, if you are very active, you may see some blood in the urine. We suggest that you reduce your activity under the circumstances until the bleeding has stopped.  Bowels:  It is important to keep your bowels regular during the postoperative period. Straining with bowel movements can cause bleeding. A  bowel movement every other day is reasonable. Use a mild laxative if needed, such as milk of magnesia 2-3 tablespoons, or 2 Dulcolax tablets. Call if you continue to have problems. If you had been taking narcotics for pain, before, during or after your surgery, you may be constipated. Take a laxative if necessary.  Medication:  You should resume your pre-surgery medications unless told not to. In addition you may be given an antibiotic to prevent or treat infection. Antibiotics are not always necessary. All medication should be taken as prescribed until the bottles are finished unless you are having an unusual reaction to one of the drugs.  Problems you should report to us:  a. Fever greater than 101F. b. Heavy bleeding, or clots (see notes above about blood in urine). c. Inability to urinate. d. Drug reactions (hives, rash, nausea, vomiting, diarrhea). e. Severe burning or pain with urination that is not improving.       Post Anesthesia Home Care Instructions  Activity: Get plenty of rest for the remainder of the day. A responsible individual must stay with you for 24 hours following the procedure.  For the next 24 hours, DO NOT: -Drive a car -Operate machinery -Drink alcoholic beverages -Take any medication unless instructed by your physician -Make any legal decisions or sign important papers.  Meals: Start with liquid foods such as gelatin or soup. Progress to regular foods as tolerated. Avoid greasy, spicy, heavy foods. If nausea and/or vomiting occur, drink only clear   liquids until the nausea and/or vomiting subsides. Call your physician if vomiting continues.  Special Instructions/Symptoms: Your throat may feel dry or sore from the anesthesia or the breathing tube placed in your throat during surgery. If this causes discomfort, gargle with warm salt water. The discomfort should disappear within 24 hours.       

## 2021-03-13 NOTE — Anesthesia Procedure Notes (Signed)
Procedure Name: LMA Insertion ?Date/Time: 03/13/2021 12:07 PM ?Performed by: Rogers Blocker, CRNA ?Pre-anesthesia Checklist: Patient identified, Emergency Drugs available, Suction available and Patient being monitored ?Patient Re-evaluated:Patient Re-evaluated prior to induction ?Oxygen Delivery Method: Circle System Utilized ?Preoxygenation: Pre-oxygenation with 100% oxygen ?Induction Type: IV induction ?Ventilation: Mask ventilation without difficulty ?LMA: LMA inserted ?LMA Size: 4.0 ?Number of attempts: 1 ?Airway Equipment and Method: Bite block ?Placement Confirmation: positive ETCO2 ?Tube secured with: Tape ?Dental Injury: Teeth and Oropharynx as per pre-operative assessment  ? ? ? ? ?

## 2021-03-13 NOTE — H&P (Signed)
CC/HPI: cc: AKI, hydronephrosis  ? ?03/12/21: 76 yo woman last seen by Dr. Karsten Ro in June 2021 for detrusor overactivity referred for AKI and hydronephrosis. She has a history of ovarian cancer and is currently undergoing radiation for this. She had a renal ultrasound that showed worsening right hydronephrosis with external compression by pelvic lymph node. She is not having any pain. Her GFR has acutely worsened to 20 with a creatinine of 2.3 from 0.88 three months previously.  ? ?  ?ALLERGIES: hydrocodone - Dizziness ?Prednisone - Anxiety ?sulfa - Hives ?  ? ?MEDICATIONS: Airborne  ?Amlodipine Besilate  ?Biotin  ?Claritin  ?Glipizide  ?Pantoprazole Sodium PRN  ?Tylenol PRN  ?Vitamin B12  ?Vitamin D3  ?  ? ?GU PSH: Cystocele Repair ? ?  ? ?NON-GU PSH: Breast Biopsy, Bilateral ?Cholecystectomy (laparoscopic) ?Partial Hysterectomy ? ?  ? ?GU PMH: Detrusor overactivity, Her symptoms are most consistent with bladder overactivity and she is emptying her bladder so we did discuss a trial of an alternative pharmacologic agent. - 06/14/2019 ?Nocturia, I have recommended a trial of Myrbetriq 50 mg. I told her that if she finds that this does not allow her to reach her goal we could potentially try a combination of this medication and an anticholinergic. She will return reassessment. - 06/14/2019 ?  ? ?NON-GU PMH: Anxiety ?Arthritis ?Cardiac murmur, unspecified ?Depression ?Diabetes Type 2 ?GERD ?Gout ?Hypercholesterolemia ?Hypertension ?Sleep Apnea ?  ? ?FAMILY HISTORY: 2 daughters - Daughter ?2 sons - Son ?Hypertension - Mother, Father, Runs in Family ?sickle cell anemia - Daughter ?stroke - Sister  ? ?SOCIAL HISTORY: Marital Status: Divorced ?Preferred Language: [place]; Ethnicity: Not Hispanic Or Latino; Race: White ?Current Smoking Status: Patient has never smoked.  ? ?Tobacco Use Assessment Completed: Used Tobacco in last 30 days? ?Has never drank.  ?Drinks 2 caffeinated drinks per day. ?  ? ?REVIEW OF SYSTEMS:    ?GU  Review Female:   Patient denies frequent urination, hard to postpone urination, burning /pain with urination, get up at night to urinate, leakage of urine, stream starts and stops, trouble starting your stream, have to strain to urinate, and being pregnant.  ?Gastrointestinal (Upper):   Patient denies nausea, vomiting, and indigestion/ heartburn.  ?Gastrointestinal (Lower):   Patient denies diarrhea and constipation.  ?Constitutional:   Patient denies fever, night sweats, weight loss, and fatigue.  ?Skin:   Patient denies skin rash/ lesion and itching.  ?Eyes:   Patient denies blurred vision and double vision.  ?Ears/ Nose/ Throat:   Patient denies sore throat and sinus problems.  ?Hematologic/Lymphatic:   Patient denies swollen glands and easy bruising.  ?Cardiovascular:   Patient denies chest pains and leg swelling.  ?Respiratory:   Patient denies cough and shortness of breath.  ?Endocrine:   Patient denies excessive thirst.  ?Musculoskeletal:   Patient denies back pain and joint pain.  ?Neurological:   Patient denies headaches and dizziness.  ?Psychologic:   Patient denies depression and anxiety.  ? ?VITAL SIGNS:    ?  03/12/2021 02:42 PM  ?Weight 192 lb / 87.09 kg  ?Height 63 in / 160.02 cm  ?BP 176/74 mmHg  ?Pulse 89 /min  ?Temperature 97.3 F / 36.2 C  ?BMI 34.0 kg/m?  ? ?MULTI-SYSTEM PHYSICAL EXAMINATION:    ?Constitutional: Well-nourished. No physical deformities. Normally developed. Good grooming.  ?Neck: Neck symmetrical, not swollen. Normal tracheal position.  ?Respiratory: No labored breathing, no use of accessory muscles.   ?Skin: No paleness, no jaundice, no cyanosis. No lesion,  no ulcer, no rash.  ?Neurologic / Psychiatric: Oriented to time, oriented to place, oriented to person. No depression, no anxiety, no agitation.  ?Eyes: Normal conjunctivae. Normal eyelids.  ?Ears, Nose, Mouth, and Throat: Left ear no scars, no lesions, no masses. Right ear no scars, no lesions, no masses. Nose no scars, no  lesions, no masses. Normal hearing. Normal lips.  ?Musculoskeletal: Normal gait and station of head and neck.  ? ?Notes: no CVA ttp  ? ?  ?Complexity of Data:  ?Lab Test Review:   BMP  ?Records Review:   Previous Hospital Records  ?Urine Test Review:   Urinalysis  ?Urodynamics Review:   Review Bladder Scan  ?X-Ray Review: Outside Ultrasound: Reviewed Films. Reviewed Report. Discussed With Patient. IMPRESSION:  ?Moderate to severe right hydronephrosis. There is increased cortical  ?echogenicity in both kidneys suggesting medical renal disease.  ?   ?   ?Electronically Signed  ?By: Elmer Picker M.D.  ?On: 03/11/2021 16:20 ?  ? ?PROCEDURES:    ?     PVR Ultrasound - 12197  ?Scanned Volume: 71 cc  ? ? ?     Urinalysis ?Dipstick Dipstick Cont'd  ?Color: Yellow Bilirubin: Neg mg/dL  ?Appearance: Clear Ketones: Neg mg/dL  ?Specific Gravity: 1.020 Blood: Neg ery/uL  ?pH: <=5.0 Protein: Trace mg/dL  ?Glucose: Neg mg/dL Urobilinogen: 0.2 mg/dL  ?  Nitrites: Neg  ?  Leukocyte Esterase: Neg leu/uL  ? ? ?ASSESSMENT:  ?    ICD-10 Details  ?1 GU:   Hydronephrosis - N13.0 Acute, Threat to Bodily Function  ?2   Acute kidney failure - N17.0 Acute, Threat to Bodily Function  ? ?PLAN:    ? ? ?      Medications ?Stop Meds: Morphine Sulfate 15 mg tablet  ?Discontinue: 03/12/2021  - Reason: stopped ?  ? ? ?      Orders ?Labs BMP(Stat)  ? ? ?      Document ?Letter(s):  Created for Patient: Clinical Summary  ? ? ?     Notes:    ?1. Right hydronephrosis with AKI: Reviewed patient's renal function and imaging which shows worsening obstruction on right hand side likely by extrinsic compression. We will repeat patient's BMP today as it is acutely worsened and GFR was approximately 26 days ago. Patient has not experienced any pain but understands that the obstruction needs to be alleviated to protect the kidney. Risks and benefits of cystoscopy with right retrograde pyelogram and stent placement were discussed with the patient in detail.  She will be scheduled for stent placement tomorrow. She does understand there is a possibility of failure to place the stent which may then require a percutaneous nephrostomy tube. She also understands the risk of worsening overactive bladder symptoms with the stent in place.  ?  ? ?

## 2021-03-13 NOTE — Anesthesia Preprocedure Evaluation (Addendum)
Anesthesia Evaluation  ?Patient identified by MRN, date of birth, ID band ?Patient awake ? ? ? ?Reviewed: ?Allergy & Precautions, NPO status , Patient's Chart, lab work & pertinent test results ? ?History of Anesthesia Complications ?(+) PONV and history of anesthetic complications ? ?Airway ?Mallampati: II ? ?TM Distance: >3 FB ?Neck ROM: Full ? ? ? Dental ? ?(+) Dental Advisory Given ?  ?Pulmonary ?sleep apnea ,  ?  ?breath sounds clear to auscultation ? ? ? ? ? ? Cardiovascular ?hypertension, Pt. on medications ? ?Rhythm:Regular Rate:Normal ? ? ?  ?Neuro/Psych ?negative neurological ROS ?   ? GI/Hepatic ?Neg liver ROS, GERD  ,  ?Endo/Other  ?diabetes, Type 2, Oral Hypoglycemic Agents ? Renal/GU ?Renal InsufficiencyRenal disease  ? ?  ?Musculoskeletal ? ?(+) Arthritis ,  ? Abdominal ?  ?Peds ? Hematology ? ?(+) Blood dyscrasia, anemia ,   ?Anesthesia Other Findings ? ? Reproductive/Obstetrics ? ?  ? ? ? ? ? ? ? ? ? ? ? ? ? ?  ?  ? ? ? ? ? ? ? ?Anesthesia Physical ?Anesthesia Plan ? ?ASA: 3 ? ?Anesthesia Plan: General  ? ?Post-op Pain Management: Tylenol PO (pre-op)* and Minimal or no pain anticipated  ? ?Induction: Intravenous ? ?PONV Risk Score and Plan: 4 or greater and Dexamethasone, Ondansetron, Treatment may vary due to age or medical condition and Propofol infusion ? ?Airway Management Planned: LMA ? ?Additional Equipment: None ? ?Intra-op Plan:  ? ?Post-operative Plan: Extubation in OR ? ?Informed Consent: I have reviewed the patients History and Physical, chart, labs and discussed the procedure including the risks, benefits and alternatives for the proposed anesthesia with the patient or authorized representative who has indicated his/her understanding and acceptance.  ? ? ? ?Dental advisory given ? ?Plan Discussed with: CRNA ? ?Anesthesia Plan Comments:   ? ? ? ? ? ?Anesthesia Quick Evaluation ? ?

## 2021-03-15 ENCOUNTER — Encounter (HOSPITAL_BASED_OUTPATIENT_CLINIC_OR_DEPARTMENT_OTHER): Payer: Self-pay | Admitting: Urology

## 2021-03-19 ENCOUNTER — Other Ambulatory Visit (HOSPITAL_COMMUNITY): Payer: Self-pay

## 2021-03-19 ENCOUNTER — Encounter (HOSPITAL_COMMUNITY): Payer: Self-pay

## 2021-03-19 DIAGNOSIS — N130 Hydronephrosis with ureteropelvic junction obstruction: Secondary | ICD-10-CM | POA: Diagnosis not present

## 2021-03-19 NOTE — Progress Notes (Signed)
Message received from Dr. Delton Coombes that patient needs sooner follow-up than scheduled. Call placed to patient and discussed recommendation. Offered patient appointment for 03/25/21 at 0800. Patient adamantly declines. Discussed next opening is not for 2+ weeks and advised patient to remain in contact with scheduling in the event of cancellations. Patient opted to keep appointment as scheduled for 04/16/2021 despite Dr. Tomie China recommendations ?

## 2021-03-20 ENCOUNTER — Telehealth: Payer: Self-pay | Admitting: Internal Medicine

## 2021-03-20 ENCOUNTER — Other Ambulatory Visit (HOSPITAL_BASED_OUTPATIENT_CLINIC_OR_DEPARTMENT_OTHER): Payer: Self-pay

## 2021-03-20 MED ORDER — ONETOUCH ULTRASOFT LANCETS MISC
1.0000 | 3 refills | Status: AC | PRN
  Filled 2021-03-20: qty 100, 90d supply, fill #0

## 2021-03-20 NOTE — Telephone Encounter (Signed)
1.Medication Requested: Lancets (ONETOUCH ULTRASOFT) lancets ? ?2. Pharmacy ([place], [place], [place]): [facility] ? ?3. On Med List: Y ? ?4. Last Visit with PCP: 03-06-2021 ? ?5. Next visit date with PCP: 09-05-2021 ? ? ?Agent: Please be advised that RX refills may take up to 3 business days. We ask that you follow-up with your pharmacy.  ?

## 2021-03-21 ENCOUNTER — Other Ambulatory Visit (HOSPITAL_BASED_OUTPATIENT_CLINIC_OR_DEPARTMENT_OTHER): Payer: Self-pay

## 2021-03-21 DIAGNOSIS — N130 Hydronephrosis with ureteropelvic junction obstruction: Secondary | ICD-10-CM | POA: Diagnosis not present

## 2021-03-22 ENCOUNTER — Telehealth: Payer: Self-pay | Admitting: Internal Medicine

## 2021-03-22 ENCOUNTER — Other Ambulatory Visit: Payer: Self-pay | Admitting: Internal Medicine

## 2021-03-22 ENCOUNTER — Other Ambulatory Visit: Payer: Self-pay

## 2021-03-22 ENCOUNTER — Other Ambulatory Visit (HOSPITAL_BASED_OUTPATIENT_CLINIC_OR_DEPARTMENT_OTHER): Payer: Self-pay

## 2021-03-22 DIAGNOSIS — E119 Type 2 diabetes mellitus without complications: Secondary | ICD-10-CM

## 2021-03-22 MED ORDER — ONETOUCH ULTRA VI STRP
ORAL_STRIP | 3 refills | Status: AC
  Filled 2021-03-22: qty 100, 50d supply, fill #0

## 2021-03-22 NOTE — Telephone Encounter (Signed)
Ok to ask pt to follow up with urology for these concerns, as it appears they may need to determine a different antibiotic if she is allergic to cipro; it should still be ok to take the alfuzosin for bladder retention ,as this helps the bladder empty better ?

## 2021-03-22 NOTE — Telephone Encounter (Signed)
Please refill as per office routine med refill policy (all routine meds to be refilled for 3 mo or monthly (per pt preference) up to one year from last visit, then month to month grace period for 3 mo, then further med refills will have to be denied) ? ?

## 2021-03-22 NOTE — Telephone Encounter (Signed)
Patient states that she had urethral stent placed 03/13/21. After procedure, patient states that she was given cipro to take, but notified provider that she was allergic. Patient states that when she went to her visit, the provider came in the room with on computer or paperwork, so patient was concerned as to if provider reviewed her chart prior to coming in. Patient also states that she was then prescribed Alfuzosin to which she has taken and started having severe weakness and fatigue. Patient looked up medication and discovered that those are side effects of medication and that this medication is typically prescribed for prostate issues. Explained to patient that medication is a dual therapy medicine that can also treat bladder issues in women. Patient unhappy and concerned with care given by Urology and wanted Dr. Jenny Reichmann to know as well as to seek advice on what else to do.  ?

## 2021-03-22 NOTE — Telephone Encounter (Signed)
Pt requesting a c/b to discuss urology treatment plan ? ?Pt states she is receiving the wrong medications and conflicting information from the nurses, doctors, and staff ? ?Encouraged pt to speak w/ urologist to express her concerns , pt states she has and continue to receive different advice  ? ?Pt states she prefers advice from her provider ? ? ?

## 2021-03-25 ENCOUNTER — Other Ambulatory Visit: Payer: Self-pay | Admitting: Internal Medicine

## 2021-03-25 ENCOUNTER — Other Ambulatory Visit (HOSPITAL_BASED_OUTPATIENT_CLINIC_OR_DEPARTMENT_OTHER): Payer: Self-pay

## 2021-03-25 ENCOUNTER — Other Ambulatory Visit (HOSPITAL_COMMUNITY): Payer: Self-pay

## 2021-03-25 MED ORDER — SOLIFENACIN SUCCINATE 5 MG PO TABS
ORAL_TABLET | Freq: Every day | ORAL | 3 refills | Status: DC
Start: 2021-03-25 — End: 2021-07-24
  Filled 2021-03-25: qty 90, 90d supply, fill #0

## 2021-03-25 NOTE — Telephone Encounter (Signed)
Please refill as per office routine med refill policy (all routine meds to be refilled for 3 mo or monthly (per pt preference) up to one year from last visit, then month to month grace period for 3 mo, then further med refills will have to be denied) ? ?

## 2021-03-26 ENCOUNTER — Other Ambulatory Visit (HOSPITAL_BASED_OUTPATIENT_CLINIC_OR_DEPARTMENT_OTHER): Payer: Self-pay

## 2021-03-27 NOTE — Telephone Encounter (Signed)
Unable to reach patient.

## 2021-04-09 ENCOUNTER — Inpatient Hospital Stay (HOSPITAL_COMMUNITY): Payer: Medicare Other | Attending: Hematology

## 2021-04-09 ENCOUNTER — Other Ambulatory Visit: Payer: Self-pay

## 2021-04-09 ENCOUNTER — Encounter (HOSPITAL_COMMUNITY): Payer: Self-pay | Admitting: Radiology

## 2021-04-09 ENCOUNTER — Ambulatory Visit (HOSPITAL_COMMUNITY)
Admission: RE | Admit: 2021-04-09 | Discharge: 2021-04-09 | Disposition: A | Discharge status: Home / Self Care | Payer: Medicare Other | Source: Ambulatory Visit | Attending: Hematology | Admitting: Hematology

## 2021-04-09 DIAGNOSIS — Z960 Presence of urogenital implants: Secondary | ICD-10-CM | POA: Insufficient documentation

## 2021-04-09 DIAGNOSIS — I700 Atherosclerosis of aorta: Secondary | ICD-10-CM | POA: Insufficient documentation

## 2021-04-09 DIAGNOSIS — Z90721 Acquired absence of ovaries, unilateral: Secondary | ICD-10-CM | POA: Insufficient documentation

## 2021-04-09 DIAGNOSIS — Z8543 Personal history of malignant neoplasm of ovary: Secondary | ICD-10-CM | POA: Insufficient documentation

## 2021-04-09 DIAGNOSIS — Z923 Personal history of irradiation: Secondary | ICD-10-CM | POA: Diagnosis not present

## 2021-04-09 DIAGNOSIS — K449 Diaphragmatic hernia without obstruction or gangrene: Secondary | ICD-10-CM | POA: Diagnosis not present

## 2021-04-09 DIAGNOSIS — N1330 Unspecified hydronephrosis: Secondary | ICD-10-CM | POA: Diagnosis not present

## 2021-04-09 DIAGNOSIS — R1909 Other intra-abdominal and pelvic swelling, mass and lump: Secondary | ICD-10-CM | POA: Diagnosis not present

## 2021-04-09 DIAGNOSIS — N2889 Other specified disorders of kidney and ureter: Secondary | ICD-10-CM | POA: Insufficient documentation

## 2021-04-09 DIAGNOSIS — C569 Malignant neoplasm of unspecified ovary: Secondary | ICD-10-CM | POA: Insufficient documentation

## 2021-04-09 DIAGNOSIS — C786 Secondary malignant neoplasm of retroperitoneum and peritoneum: Secondary | ICD-10-CM | POA: Diagnosis not present

## 2021-04-09 DIAGNOSIS — Z466 Encounter for fitting and adjustment of urinary device: Secondary | ICD-10-CM | POA: Diagnosis not present

## 2021-04-09 DIAGNOSIS — Z90710 Acquired absence of both cervix and uterus: Secondary | ICD-10-CM | POA: Insufficient documentation

## 2021-04-09 LAB — CBC WITH DIFFERENTIAL/PLATELET
Abs Immature Granulocytes: 0.03 10*3/uL (ref 0.00–0.07)
Basophils Absolute: 0 10*3/uL (ref 0.0–0.1)
Basophils Relative: 0 %
Eosinophils Absolute: 0.1 10*3/uL (ref 0.0–0.5)
Eosinophils Relative: 2 %
HCT: 35.3 % — ABNORMAL LOW (ref 36.0–46.0)
Hemoglobin: 11.6 g/dL — ABNORMAL LOW (ref 12.0–15.0)
Immature Granulocytes: 0 %
Lymphocytes Relative: 26 %
Lymphs Abs: 1.8 10*3/uL (ref 0.7–4.0)
MCH: 30.5 pg (ref 26.0–34.0)
MCHC: 32.9 g/dL (ref 30.0–36.0)
MCV: 92.9 fL (ref 80.0–100.0)
Monocytes Absolute: 0.5 10*3/uL (ref 0.1–1.0)
Monocytes Relative: 7 %
Neutro Abs: 4.4 10*3/uL (ref 1.7–7.7)
Neutrophils Relative %: 65 %
Platelets: 213 10*3/uL (ref 150–400)
RBC: 3.8 MIL/uL — ABNORMAL LOW (ref 3.87–5.11)
RDW: 13.7 % (ref 11.5–15.5)
WBC: 6.8 10*3/uL (ref 4.0–10.5)
nRBC: 0 % (ref 0.0–0.2)

## 2021-04-09 LAB — COMPREHENSIVE METABOLIC PANEL
ALT: 13 U/L (ref 0–44)
AST: 18 U/L (ref 15–41)
Albumin: 4.7 g/dL (ref 3.5–5.0)
Alkaline Phosphatase: 69 U/L (ref 38–126)
Anion gap: 7 (ref 5–15)
BUN: 25 mg/dL — ABNORMAL HIGH (ref 8–23)
CO2: 23 mmol/L (ref 22–32)
Calcium: 9.9 mg/dL (ref 8.9–10.3)
Chloride: 107 mmol/L (ref 98–111)
Creatinine, Ser: 1.37 mg/dL — ABNORMAL HIGH (ref 0.44–1.00)
GFR, Estimated: 40 mL/min — ABNORMAL LOW (ref >60)
Glucose, Bld: 154 mg/dL — ABNORMAL HIGH (ref 70–99)
Potassium: 3.8 mmol/L (ref 3.5–5.1)
Sodium: 137 mmol/L (ref 135–145)
Total Bilirubin: 0.4 mg/dL (ref 0.3–1.2)
Total Protein: 7.8 g/dL (ref 6.5–8.1)

## 2021-04-09 MED ORDER — IOHEXOL 300 MG/ML  SOLN
100.0000 mL | Freq: Once | INTRAMUSCULAR | Status: AC | PRN
  Administered 2021-04-09: 80 mL via INTRAVENOUS

## 2021-04-10 ENCOUNTER — Other Ambulatory Visit (HOSPITAL_COMMUNITY): Payer: Self-pay

## 2021-04-10 LAB — CA 125: Cancer Antigen (CA) 125: 15.8 U/mL (ref 0.0–38.1)

## 2021-04-16 ENCOUNTER — Inpatient Hospital Stay (HOSPITAL_COMMUNITY): Payer: Medicare Other | Attending: Hematology | Admitting: Hematology

## 2021-04-16 VITALS — BP 157/93 | HR 90 | Temp 98.7°F | Resp 20 | Ht 62.01 in | Wt 186.1 lb

## 2021-04-16 DIAGNOSIS — Z803 Family history of malignant neoplasm of breast: Secondary | ICD-10-CM | POA: Insufficient documentation

## 2021-04-16 DIAGNOSIS — I10 Essential (primary) hypertension: Secondary | ICD-10-CM | POA: Diagnosis not present

## 2021-04-16 DIAGNOSIS — C569 Malignant neoplasm of unspecified ovary: Secondary | ICD-10-CM

## 2021-04-16 DIAGNOSIS — Z808 Family history of malignant neoplasm of other organs or systems: Secondary | ICD-10-CM | POA: Diagnosis not present

## 2021-04-16 DIAGNOSIS — C482 Malignant neoplasm of peritoneum, unspecified: Secondary | ICD-10-CM | POA: Diagnosis not present

## 2021-04-16 DIAGNOSIS — C563 Malignant neoplasm of bilateral ovaries: Secondary | ICD-10-CM | POA: Insufficient documentation

## 2021-04-16 NOTE — Progress Notes (Signed)
? ?[facility] ?[address] ?[place], [place] [address] ? ? ?CLINIC:  ?Medical Oncology/Hematology ? ?PCP:  ?Biagio Borg, MD ?[address] / [place] [address] ?[phone] ? ? 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 ?PRIOR THERAPY:  ?1. Carboplatin, paclitaxel and Aloxi x 6 cycles from 07/28/2019 to 11/17/2019. ?2. Robotic assisted BSO with tumor debulking on 01/03/2020. ? ?NGS Results: Foundation 1 MS--stable, TMB 0 Muts/Mb ? ?CURRENT THERAPY: surveillance ? ?BRIEF ONCOLOGIC HISTORY:  ?Oncology History  ?Malignant neoplasm of both ovaries  ?06/20/2019 Initial Diagnosis  ? Primary ovarian adenocarcinoma, unspecified laterality ([place] [facility]) ?  ?07/01/2019 Genetic Testing  ? Foundation One ? ? ?  ?07/11/2019 Genetic Testing  ? Negative genetic testing:  No pathogenic variants detected on the [facility] Multi-Cancer Panel. The report date is 07/11/2019. ? ?The Multi-Cancer Panel offered by [facility] includes sequencing and/or deletion duplication testing of the following 85 genes: AIP, ALK, APC, ATM, AXIN2,BAP1,  BARD1, BLM, BMPR1A, BRCA1, BRCA2, BRIP1, CASR, CDC73, CDH1, CDK4, CDKN1B, CDKN1C, CDKN2A (p14ARF), CDKN2A (p16INK4a), CEBPA, CHEK2, CTNNA1, DICER1, DIS3L2, EGFR (c.2369C>T, p.Thr790Met variant only), EPCAM (Deletion/duplication testing only), FH, FLCN, GATA2, GPC3, GREM1 (Promoter region deletion/duplication testing only), HOXB13 (c.251G>A, p.Gly84Glu), HRAS, KIT, MAX, MEN1, MET, MITF (c.952G>A, p.Glu318Lys variant only), MLH1, MSH2, MSH3, MSH6, MUTYH, NBN, NF1, NF2, NTHL1, PALB2, PDGFRA, PHOX2B, PMS2, POLD1, POLE, POT1, PRKAR1A, PTCH1, PTEN, RAD50, RAD51C, RAD51D, RB1, RECQL4, RET, RNF43, RUNX1, SDHAF2, SDHA (sequence changes only), SDHB, SDHC, SDHD, SMAD4, SMARCA4, SMARCB1, SMARCE1, STK11, SUFU, TERC, TERT, TMEM127, TP53, TSC1, TSC2, VHL, WRN and WT1. ?  ?07/28/2019 -  Chemotherapy  ? The patient had palonosetron (ALOXI) injection 0.25 mg,  0.25 mg, Intravenous,  Once, 6 of 7 cycles ?Administration: 0.25 mg (07/28/2019), 0.25 mg (08/18/2019), 0.25 mg (09/15/2019), 0.25 mg (10/06/2019), 0.25 mg (10/27/2019), 0.25 mg (11/17/2019) ?pegfilgrastim (NEULASTA ONPRO KIT) injection 6 mg, 6 mg, Subcutaneous, Once, 6 of 7 cycles ?Administration: 6 mg (07/28/2019), 6 mg (08/18/2019), 6 mg (09/15/2019), 6 mg (10/06/2019), 6 mg (10/27/2019), 6 mg (11/17/2019) ?CARBOplatin (PARAPLATIN) 470 mg in sodium chloride 0.9 % 250 mL chemo infusion, 470 mg (100 % of original dose 470 mg), Intravenous,  Once, 6 of 7 cycles ?Dose modification:   (original dose 470 mg, Cycle 1, Reason: Provider Judgment),   (original dose 568.2 mg, Cycle 2),   (original dose 460.5 mg, Cycle 3), 460 mg (original dose 460 mg, Cycle 4, Reason: Provider Judgment),   (original dose 473.5 mg, Cycle 5, Reason: Provider Judgment), 460 mg (original dose 460 mg, Cycle 6) ?Administration: 470 mg (07/28/2019), 570 mg (08/18/2019), 460 mg (09/15/2019), 460 mg (10/06/2019), 460 mg (10/27/2019), 460 mg (11/17/2019) ?fosaprepitant (EMEND) 150 mg in sodium chloride 0.9 % 145 mL IVPB, 150 mg, Intravenous,  Once, 6 of 7 cycles ?Administration: 150 mg (07/28/2019), 150 mg (08/18/2019), 150 mg (09/15/2019), 150 mg (10/06/2019), 150 mg (10/27/2019), 150 mg (11/17/2019) ?PACLitaxel (TAXOL) 282 mg in sodium chloride 0.9 % 250 mL chemo infusion (> 75m/m2), 140 mg/m2 = 282 mg (80 % of original dose 175 mg/m2), Intravenous,  Once, 6 of 7 cycles ?Dose modification: 140 mg/m2 (80 % of original dose 175 mg/m2, Cycle 1, Reason: Provider Judgment), 150 mg/m2 (original dose 175 mg/m2, Cycle 2, Reason: Provider Judgment), 140 mg/m2 (original dose 175 mg/m2, Cycle 3, Reason: Provider Judgment), 140 mg/m2 (original dose 175 mg/m2, Cycle 4, Reason: Other (see comments), Comment: fatigue) ?Administration: 282 mg (07/28/2019), 300 mg (08/18/2019), 282 mg (09/15/2019), 282 mg (  10/06/2019), 282 mg (10/27/2019), 282 mg (11/17/2019) ? ? for chemotherapy treatment.  ? ?   ? ? ?CANCER STAGING: ?Cancer Staging  ?No matching staging information was found for the patient. ? ?INTERVAL HISTORY:  ?Laura [name], a 76 y.o. 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 Kensy was last seen on 12/11/2020.  ? ?Today she reports feeling well. She reports fatigue and feels increasingly weak since radiation which has made it difficult to do her daily activities. She reports her energy is returning, but she still feels weak. She denies current pain and numbness/tingling. She has lost 11 lbs since 11/29. Her appetite is poor, but she reports her taste has returned.  ? ?REVIEW OF SYSTEMS:  ?Review of Systems  ?Constitutional:  Positive for appetite change, fatigue and unexpected weight change (-11 lbs).  ?Respiratory:  Positive for shortness of breath.   ?Neurological:  Negative for numbness.  ?All other systems reviewed and are negative. ? ?PAST MEDICAL/SURGICAL HISTORY:  ?Past Medical History:  ?Diagnosis Date  ? Anemia   ? Anxiety   ? Arthritis   ? back of neck, bones spurs on neck  ? Cancer [facility])   ? ovarian cancer  ? Cataract both eyes   ? Cervical disc disease   ? Diabetes mellitus Type 2   ? Family history of adverse reaction to anesthesia   ? sister slow to awaken  ? Family history of thyroid cancer   ? GERD (gastroesophageal reflux disease)   ? Heart murmur   ? History of COVID-19 06/2020  ? pain in side took paxlovid x 5 days all symptoms resolved  ? History of radiation therapy   ? abdominal lymph nodes 01/21/2021-02/01/2021  Dr Gery Pray  ? Hydronephrosis 08/24/2020  ? Right Kidney (stable per CT)  ? Hyperlipidemia   ? Hypertension   ? IBS (irritable bowel syndrome)   ? Left shoulder frozen with limited rom   ? Mucoid cyst of joint   ? right thumb  ? Neuropathy 03/12/2021  ? hands/fingers both hands numb and tingle @ times  ? Port-A-Cath in place 07/21/2019  ? Reflux   ? Sleep apnea 03/12/2021  ? has not used cpap in 3  years  ? Vertigo 12/2010  ? none since treated at [facility]  ? Wears glasses for reading   ? Wears partial dentures upper   ? ?Past Surgical History:  ?Procedure Laterality Date  ? BL[facility] SURG[facility]    [facility]  ? BREAST EXCISIONAL BIOPSY Bilateral   ? BREAST SURGERY    ? fibroid cyst removed ? over 10 yrs [facility] on 03-12-2021  ? CHOLECYSTECTOMY    ? yrs ago  ? COLONOSCOPY  07/02/2020  ? and 09-19-2016  ? CYSTOSCOPY W/ URETERAL STENT PLACEMENT Right 03/13/2021  ? Procedure: CYSTOSCOPY WITH RETROGRADE PYELOGRAM/URETERAL STENT PLACEMENT;  Surgeon: Robley Fries, MD;  Location: [facility];  Service: Urology;  Laterality: Right;  30 MINS  ? fibroids removed    ? breast (both breasts) age 89 and total of 3 surgeries  ? history of chemotherapy    ? 6 rounds june 2021  ? IR IMAGING GUIDED PORT INSERTION  07/26/2019  ? right  ? MASS EXCISION Right 06/26/2016  ? Procedure: EXCISION MUCOID TUMOR RIGHT THUMB, IP RIGHT THUMB;  Surgeon: Daryll Brod, MD;  Location: [facility];  Service: Orthopedics;  Laterality: Right;  ?  ROBOTIC ASSISTED BILATERAL SALPINGO OOPHERECTOMY N/A 01/03/2020  ? Procedure: XI ROBOTIC ASSISTED BILATERAL SALPINGO OOPHORECTOMY, RADICAL TUMOR DEBULKING;  Surgeon: Everitt Amber, MD;  Location: [facility];  Service: Gynecology;  Laterality: N/A;  ? ROBOTIC PELVIC AND PARA-AORTIC LYMPH NODE DISSECTION N/A 01/03/2020  ? Procedure: XI ROBOTIC PARA-AORTIC LYMPHADENECTOMY;  Surgeon: Everitt Amber, MD;  [facility];  Service: Gynecology;  Laterality: N/A;  ? VAGINAL HYSTERECTOMY    ? age late 75's  ? ? ?SOCIAL HISTORY:  ?Social History  ? ?Socioeconomic History  ? Marital status: Divorced  ?  Spouse name: Not on file  ? Number of children: 4  ? Years of education: 33  ? Highest education level: Bachelor's degree (e.g., BA, AB, BS)  ?Occupational History  ? Occupation: retired Geographical information systems officer  ?Tobacco Use  ? Smoking status: Never  ? Smokeless tobacco: Never  ?Vaping  Use  ? Vaping Use: Never used  ?Substance and Sexual Activity  ? Alcohol use: No  ?  Alcohol/week: 0.0 standard drinks  ? Drug use: No  ? Sexual activity: Not Currently  ?Other Topics Concern  ? Not on file

## 2021-04-16 NOTE — Patient Instructions (Addendum)
[place] at [facility] ?Discharge Instructions ? ?You were seen and examined today by Dr. Delton Coombes. He reviewed your most recent labs and scan and everything looks stable except that the spot in your retroperitoneal node has grown. Please keep follow up appointments as scheduled in 3 weeks. ? ? ?Thank you for choosing [place] at [facility] to provide your oncology and hematology care.  To afford each patient quality time with our provider, please arrive at least 15 minutes before your scheduled appointment time.  ? ?If you have a lab appointment with the [facility] please come in thru the Main Entrance and check in at the main information desk. ? ?You need to re-schedule your appointment should you arrive 10 or more minutes late.  We strive to give you quality time with our providers, and arriving late affects you and other patients whose appointments are after yours.  Also, if you no show three or more times for appointments you may be dismissed from the clinic at the providers discretion.     ?Again, thank you for choosing [facility].  Our hope is that these requests will decrease the amount of time that you wait before being seen by our physicians.       ?_____________________________________________________________ ? ?Should you have questions after your visit to [facility], please contact our office at [phone] and follow the prompts.  Our office hours are 8:00 a.m. and 4:30 p.m. Monday - Friday.  Please note that voicemails left after 4:00 p.m. may not be returned until the following business day.  We are closed weekends and major holidays.  You do have access to a nurse 24-7, just call the main number to the clinic [phone] and do not press any options, hold on the line and a nurse will answer the phone.   ? ?For prescription refill requests, have your pharmacy contact our office and allow 72 hours.   ? ?Due to  Covid, you will need to wear a mask upon entering the hospital. If you do not have a mask, a mask will be given to you at the Main Entrance upon arrival. For doctor visits, patients may have 1 support person age 63 or older with them. For treatment visits, patients can not have anyone with them due to social distancing guidelines and our immunocompromised population.  ? ?  ?

## 2021-05-06 ENCOUNTER — Other Ambulatory Visit (HOSPITAL_BASED_OUTPATIENT_CLINIC_OR_DEPARTMENT_OTHER): Payer: Self-pay

## 2021-05-07 ENCOUNTER — Other Ambulatory Visit (HOSPITAL_BASED_OUTPATIENT_CLINIC_OR_DEPARTMENT_OTHER): Payer: Self-pay

## 2021-05-14 ENCOUNTER — Inpatient Hospital Stay (HOSPITAL_COMMUNITY): Payer: Medicare Other | Attending: Hematology

## 2021-05-14 ENCOUNTER — Encounter (HOSPITAL_COMMUNITY): Payer: Self-pay

## 2021-05-14 ENCOUNTER — Inpatient Hospital Stay (HOSPITAL_BASED_OUTPATIENT_CLINIC_OR_DEPARTMENT_OTHER): Payer: Medicare Other | Admitting: Hematology

## 2021-05-14 VITALS — BP 164/81 | HR 84 | Temp 98.9°F | Resp 18 | Ht 62.4 in | Wt 188.5 lb

## 2021-05-14 DIAGNOSIS — R5383 Other fatigue: Secondary | ICD-10-CM | POA: Diagnosis not present

## 2021-05-14 DIAGNOSIS — I10 Essential (primary) hypertension: Secondary | ICD-10-CM | POA: Insufficient documentation

## 2021-05-14 DIAGNOSIS — Z808 Family history of malignant neoplasm of other organs or systems: Secondary | ICD-10-CM | POA: Diagnosis not present

## 2021-05-14 DIAGNOSIS — E119 Type 2 diabetes mellitus without complications: Secondary | ICD-10-CM | POA: Insufficient documentation

## 2021-05-14 DIAGNOSIS — Z5111 Encounter for antineoplastic chemotherapy: Secondary | ICD-10-CM | POA: Insufficient documentation

## 2021-05-14 DIAGNOSIS — C569 Malignant neoplasm of unspecified ovary: Secondary | ICD-10-CM | POA: Diagnosis not present

## 2021-05-14 DIAGNOSIS — Z803 Family history of malignant neoplasm of breast: Secondary | ICD-10-CM | POA: Insufficient documentation

## 2021-05-14 DIAGNOSIS — C563 Malignant neoplasm of bilateral ovaries: Secondary | ICD-10-CM | POA: Diagnosis not present

## 2021-05-14 DIAGNOSIS — Z5189 Encounter for other specified aftercare: Secondary | ICD-10-CM | POA: Insufficient documentation

## 2021-05-14 LAB — BASIC METABOLIC PANEL
Anion gap: 8 (ref 5–15)
BUN: 27 mg/dL — ABNORMAL HIGH (ref 8–23)
CO2: 23 mmol/L (ref 22–32)
Calcium: 9.9 mg/dL (ref 8.9–10.3)
Chloride: 110 mmol/L (ref 98–111)
Creatinine, Ser: 1.34 mg/dL — ABNORMAL HIGH (ref 0.44–1.00)
GFR, Estimated: 41 mL/min — ABNORMAL LOW (ref >60)
Glucose, Bld: 187 mg/dL — ABNORMAL HIGH (ref 70–99)
Potassium: 4.1 mmol/L (ref 3.5–5.1)
Sodium: 141 mmol/L (ref 135–145)

## 2021-05-14 NOTE — Patient Instructions (Addendum)
[place] at [facility] ?Discharge Instructions ? ?You were seen and examined today by Dr. Delton Coombes. ? ?Dr. Delton Coombes discussed your most recent lab work and everything looks stable. ? ?Please keep follow-up appointments as scheduled. ? ? ? ?Thank you for choosing [place] at [facility] to provide your oncology and hematology care.  To afford each patient quality time with our provider, please arrive at least 15 minutes before your scheduled appointment time.  ? ?If you have a lab appointment with the [facility] please come in thru the Main Entrance and check in at the main information desk. ? ?You need to re-schedule your appointment should you arrive 10 or more minutes late.  We strive to give you quality time with our providers, and arriving late affects you and other patients whose appointments are after yours.  Also, if you no show three or more times for appointments you may be dismissed from the clinic at the providers discretion.     ?Again, thank you for choosing [facility].  Our hope is that these requests will decrease the amount of time that you wait before being seen by our physicians.       ?_____________________________________________________________ ? ?Should you have questions after your visit to [facility], please contact our office at [phone] and follow the prompts.  Our office hours are 8:00 a.m. and 4:30 p.m. Monday - Friday.  Please note that voicemails left after 4:00 p.m. may not be returned until the following business day.  We are closed weekends and major holidays.  You do have access to a nurse 24-7, just call the main number to the clinic [phone] and do not press any options, hold on the line and a nurse will answer the phone.   ? ?For prescription refill requests, have your pharmacy contact our office and allow 72 hours.   ? ?Due to Covid, you will need to wear a mask upon entering the  hospital. If you do not have a mask, a mask will be given to you at the Main Entrance upon arrival. For doctor visits, patients may have 1 support person age 34 or older with them. For treatment visits, patients can not have anyone with them due to social distancing guidelines and our immunocompromised population.  ? ?  ?

## 2021-05-14 NOTE — Progress Notes (Signed)
? ?[facility] [place] ?[address] ?[place], [place] [address] ? ? ?CLINIC:  ?Medical Oncology/Hematology ? ?PCP:  ?Biagio Borg, MD ?[address] / [place] [address] ?[phone] ? ? 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 ?PRIOR THERAPY:  ?1. Carboplatin, paclitaxel and Aloxi x 6 cycles from 07/28/2019 to 11/17/2019. ?2. Robotic assisted BSO with tumor debulking on 01/03/2020. ? ?NGS Results: Foundation 1 MS--stable, TMB 0 Muts/Mb ? ?CURRENT THERAPY: surveillance ? ?BRIEF ONCOLOGIC HISTORY:  ?Oncology History  ?Malignant neoplasm of both ovaries  ?06/20/2019 Initial Diagnosis  ? Primary ovarian adenocarcinoma, unspecified laterality ([place] [facility]) ?  ?07/01/2019 Genetic Testing  ? Foundation One ? ? ?  ?07/11/2019 Genetic Testing  ? Negative genetic testing:  No pathogenic variants detected on the [facility] Multi-Cancer Panel. The report date is 07/11/2019. ? ?The Multi-Cancer Panel offered by [facility] includes sequencing and/or deletion duplication testing of the following 85 genes: AIP, ALK, APC, ATM, AXIN2,BAP1,  BARD1, BLM, BMPR1A, BRCA1, BRCA2, BRIP1, CASR, CDC73, CDH1, CDK4, CDKN1B, CDKN1C, CDKN2A (p14ARF), CDKN2A (p16INK4a), CEBPA, CHEK2, CTNNA1, DICER1, DIS3L2, EGFR (c.2369C>T, p.Thr790Met variant only), EPCAM (Deletion/duplication testing only), FH, FLCN, GATA2, GPC3, GREM1 (Promoter region deletion/duplication testing only), HOXB13 (c.251G>A, p.Gly84Glu), HRAS, KIT, MAX, MEN1, MET, MITF (c.952G>A, p.Glu318Lys variant only), MLH1, MSH2, MSH3, MSH6, MUTYH, NBN, NF1, NF2, NTHL1, PALB2, PDGFRA, PHOX2B, PMS2, POLD1, POLE, POT1, PRKAR1A, PTCH1, PTEN, RAD50, RAD51C, RAD51D, RB1, RECQL4, RET, RNF43, RUNX1, SDHAF2, SDHA (sequence changes only), SDHB, SDHC, SDHD, SMAD4, SMARCA4, SMARCB1, SMARCE1, STK11, SUFU, TERC, TERT, TMEM127, TP53, TSC1, TSC2, VHL, WRN and WT1. ?  ?07/28/2019 -  Chemotherapy  ? Patient is on Treatment Plan : OVARIAN Carboplatin (AUC  6) / Paclitaxel (175) q21d x 6 cycles  ? ?  ?  ? ? ?CANCER STAGING: ? Cancer Staging  ?No matching staging information was found for the patient. ? ?INTERVAL HISTORY:  ?Laura [name], a 76 y.o. 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 Laura [name] was last seen on 04/16/2021.  ? ?Today she reports feeling well. She reports pain in the right side of her abdomen starting on 4/29 which disrupted her sleep; she took tylenol for this pain which helped. She reports continues weakness as she has not yet enrolled in PT. She reports frequent urination at night. She reports increased anxiety due to her increased weakness. She denies swellings in her legs.  ? ?REVIEW OF SYSTEMS:  ?Review of Systems  ?Constitutional:  Positive for fatigue. Negative for appetite change.  ?Respiratory:  Positive for cough and shortness of breath.   ?Cardiovascular:  Negative for leg swelling.  ?Gastrointestinal:  Positive for abdominal pain (5/10  R lower).  ?Genitourinary:  Positive for frequency and nocturia.   ?Neurological:  Positive for dizziness.  ?Psychiatric/Behavioral:  Positive for sleep disturbance. The patient is nervous/anxious.   ?All other systems reviewed and are negative. ? ?PAST MEDICAL/SURGICAL HISTORY:  ?Past Medical History:  ?Diagnosis Date  ? Anemia   ? Anxiety   ? Arthritis   ? back of neck, bones spurs on neck  ? Cancer [facility])   ? ovarian cancer  ? Cataract both eyes   ? Cervical disc disease   ? Diabetes mellitus Type 2   ? Family history of adverse reaction to anesthesia   ? sister slow to awaken  ? Family history of thyroid cancer   ? GERD (gastroesophageal reflux disease)   ? Heart murmur   ?  History of COVID-19 06/2020  ? pain in side took paxlovid x 5 days all symptoms resolved  ? History of radiation therapy   ? abdominal lymph nodes 01/21/2021-02/01/2021  Dr Gery Pray  ? Hydronephrosis 08/24/2020  ? Right Kidney (stable per CT)  ? Hyperlipidemia   ?  Hypertension   ? IBS (irritable bowel syndrome)   ? Left shoulder frozen with limited rom   ? Mucoid cyst of joint   ? right thumb  ? Neuropathy 03/12/2021  ? hands/fingers both hands numb and tingle @ times  ? Port-A-Cath in place 07/21/2019  ? Reflux   ? Sleep apnea 03/12/2021  ? has not used cpap in 3 years  ? Vertigo 12/2010  ? none since treated at [facility]  ? Wears glasses for reading   ? Wears partial dentures upper   ? ?Past Surgical History:  ?Procedure Laterality Date  ? BLADDER SURGERY    ? [facility]  ? BREAST EXCISIONAL BIOPSY Bilateral   ? BREAST SURGERY    ? fibroid cyst removed ? over 10 yrs [facility] on 03-12-2021  ? CHOLECYSTECTOMY    ? yrs ago  ? COLONOSCOPY  07/02/2020  ? and 09-19-2016  ? CYSTOSCOPY W/ URETERAL STENT PLACEMENT Right 03/13/2021  ? Procedure: CYSTOSCOPY WITH RETROGRADE PYELOGRAM/URETERAL STENT PLACEMENT;  Surgeon: Robley Fries, MD;  Location: [facility];  Service: Urology;  Laterality: Right;  30 MINS  ? fibroids removed    ? breast (both breasts) age 88 and total of 3 surgeries  ? history of chemotherapy    ? 6 rounds june 2021  ? IR IMAGING GUIDED PORT INSERTION  07/26/2019  ? right  ? MASS EXCISION Right 06/26/2016  ? Procedure: EXCISION MUCOID TUMOR RIGHT THUMB, IP RIGHT THUMB;  Surgeon: Daryll Brod, MD;  Location: [facility];  Service: Orthopedics;  Laterality: Right;  ? ROBOTIC ASSISTED BILATERAL SALPINGO OOPHERECTOMY N/A 01/03/2020  ? Procedure: XI ROBOTIC ASSISTED BILATERAL SALPINGO OOPHORECTOMY, RADICAL TUMOR DEBULKING;  Surgeon: Everitt Amber, MD;  [facility];  Service: Gynecology;  Laterality: N/A;  ? ROBOTIC PELVIC AND PARA-AORTIC LYMPH NODE DISSECTION N/A 01/03/2020  ? Procedure: XI ROBOTIC PARA-AORTIC LYMPHADENECTOMY;  Surgeon: Everitt Amber, MD;  [facility];  Service: Gynecology;  Laterality: N/A;  ? VAGINAL HYSTERECTOMY    ? age late 60's  ? ? ?SOCIAL HISTORY:  ?Social History  ? ?Socioeconomic History  ? Marital  status: Divorced  ?  Spouse name: Not on file  ? Number of children: 4  ? Years of education: 37  ? Highest education level: Bachelor's degree (e.g., BA, AB, BS)  ?Occupational History  ? Occupation: retired Geographical information systems officer  ?Tobacco Use  ? Smoking status: Never  ? Smokeless tobacco: Never  ?Vaping Use  ? Vaping Use: Never used  ?Substance and Sexual Activity  ? Alcohol use: No  ?  Alcohol/week: 0.0 standard drinks  ? Drug use: No  ? Sexual activity: Not Currently  ?Other Topics Concern  ? Not on file  ?Social History Narrative  ? Not on file  ? ?Social Determinants of Health  ? ?Financial Resource Strain: Low Risk   ? Difficulty of Paying Living Expenses: Not hard at all  ?Food Insecurity: No Food Insecurity  ? Worried About Charity fundraiser in the Last Year: Never true  ? Ran Out of Food in the Last Year: Never true  ?Transportation Needs: No Transportation Needs  ?  Lack of Transportation (Medical): No  ? Lack of Transportation (Non-Medical): No  ?Physical Activity: Insufficiently Active  ? Days of Exercise per Week: 2 days  ? Minutes of Exercise per Session: 60 min  ?Stress: No Stress Concern Present  ? Feeling of Stress : Not at all  ?Social Connections: Moderately Integrated  ? Frequency of Communication with Friends and Family: More than three times a week  ? Frequency of Social Gatherings with Friends and Family: Once a week  ? Attends Religious Services: More than 4 times per year  ? Active Member of Clubs or Organizations: Yes  ? Attends Archivist Meetings: More than 4 times per year  ? Marital Status: Divorced  ?Intimate Partner Violence: Not At Risk  ? Fear of Current or Ex-Partner: No  ? Emotionally Abused: No  ? Physically Abused: No  ? Sexually Abused: No  ? ? ?FAMILY HISTORY:  ?Family History  ?Problem Relation Age of Onset  ? Hypertension Mother   ? Diabetes Father   ? Hypertension Father   ? Diabetes Sister   ? Breast cancer Sister   ?     stage 0  ? Hypertension Sister   ?  Stroke Sister   ? Diabetes Maternal Aunt   ? Thyroid cancer Daughter 70  ? Hypertension Other   ? Colon cancer Neg Hx   ? Esophageal cancer Neg Hx   ? Stomach cancer Neg Hx   ? Rectal cancer Neg Hx   ? [place]

## 2021-05-15 ENCOUNTER — Encounter (HOSPITAL_COMMUNITY): Payer: Self-pay

## 2021-05-15 ENCOUNTER — Encounter (HOSPITAL_COMMUNITY): Payer: Self-pay | Admitting: *Deleted

## 2021-05-15 ENCOUNTER — Other Ambulatory Visit (HOSPITAL_COMMUNITY): Payer: Self-pay

## 2021-05-15 ENCOUNTER — Inpatient Hospital Stay (HOSPITAL_COMMUNITY): Payer: Medicare Other | Admitting: Licensed Clinical Social Worker

## 2021-05-15 DIAGNOSIS — C569 Malignant neoplasm of unspecified ovary: Secondary | ICD-10-CM

## 2021-05-15 NOTE — Progress Notes (Signed)
[facility] CSW Progress Note ? ?Clinical Social Worker contacted patient by phone to assess for supportive needs.  Pt previously treated for ovarian cancer for which she was diagnosed in June of 2021.  A mass was detected on follow up imaging which led to radiation treatment in January and now a restart of chemotherapy.  Pt resides alone with 2 adult children in [place] and 1 in [place].  Pt states she has a number of family members residing close by who are able to provide support and enjoys time spent with her grand and great grand children.  Pt reflective about previous chemotherapy treatment stating at the time of initial diagnosis she was in denial and had difficulty even saying the word cancer.  Pt previously isolated herself, stating she was not receptive to support even from family and friends as she did not want to speak with anyone while she went through treatment.  Pt states she does not feel the same this time around as she is optimistic and hopeful she will be able to complete treatment again with positive results.  Pt reports she did become anxious when she reviewed her schedule on MyChart as she thought her treatments were closer together than actually scheduled; however, after speaking with the nurse navigator she feels much better.  Pt reports she is still physically weak since completing radiation in January which does concern her at the start of chemo.  Pt has been referred to physical therapy which CSW reinforced the benefit of from both a physical strengthening perspective, but also from an emotional and motivational prospective while undergoing treatment.  Pt verbalized agreement to reach out to PT to set up an appointment.  Pt does reside alone, but states she has supportive family who can provide transportation or support if needed.  CSW discussed anxiety at length with pt as she did struggle with anxiety during initial treatment.  Pt reports she went to the ED at one point when previously treated  and was given anti-anxiety medication which was beneficial.  According to pt steroids make her extremely restless.  Pt encouraged to discuss this with the medical team as it may be appropriate for her to have anxiety medication on hand preventatively.  CSW spoke w/ pt about a referral to [facility] to which she was receptive.  Contact information for CSW given to pt.  CSW to remain available throughout duration of treatment to provide supportive services as appropriate.   ? ? ? ?[place] [facility], LCSW ?

## 2021-05-15 NOTE — Progress Notes (Signed)
Return call received from patient. Patient states that she was reviewing her appointment schedule on MyChart and has concerns. Reviewed appts and treatment plan. Explained that treatments will occur every 3 weeks as they previously did and that she will see Rebekah, PA 2 weeks after restarting treatment to assess how she tolerated treatment and manage any side effects she may be having at that time. Patient expressed understanding and gratitude for my call. All questions addressed and answered. ?

## 2021-05-15 NOTE — Progress Notes (Signed)
Standing orders placed for treatment per Dr. Delton Coombes verbal order. ?

## 2021-05-15 NOTE — Progress Notes (Signed)
MyChart message received from patient expressing questions regarding schedule. See that encounter. I have attempted to call the patient back twice with no success. There no VM box available. MyChart message response sent to patient with my contact information for patient to call me to discuss questions further. ?

## 2021-05-20 ENCOUNTER — Telehealth: Payer: Self-pay | Admitting: Internal Medicine

## 2021-05-20 NOTE — Progress Notes (Signed)
? ? ?Subjective:  ? ? Patient ID: Laura [name], female    DOB: September 14, 1945, 76 y.o.   MRN: [id number] ? ? ? ? ? ?HPI ?[name] is here for  ?Chief Complaint  ?Patient presents with  ? Ankle Pain  ?  Left ankle pain; Pain in right foot (fell) and big right toe is sore and painful  ? Medication Refill  ?  Request to have refill on Ativan to have on hand when needed  ? ? ?Pain in left ankle - has been wearing sandle.  One week ago - started having a sharp pain in her medial left ankle.  She started wearing sneakers and that helped.  That helped but still had sharp pain occ.  This past weekend it happened and she fell.  She has some mild injuries from the fall.  Sharp pain only occurs with walking.  Never at rest.  No prior injuries to ankle in past.    Denies swelling, redness and warmth.  No N/T in left foot.   ? ?She has right first toe pain.  She thinks that may have started with a fall.  It hurts most near the edge of her cuticle on the lateral aspect. ? ?She would like to have her Ativan refilled.  She uses that on occasion for anxiety, especially when she cannot sleep.  She is undergoing cancer treatment and feels it would be best to have it on hand. ? ? ?Medications and allergies reviewed with patient and updated if appropriate. ? ?Current Outpatient Medications on File Prior to Visit  ?Medication Sig Dispense Refill  ? acetaminophen (TYLENOL) 500 MG tablet Take 1 tablet (500 mg total) by mouth every 6 (six) hours as needed. 30 tablet 0  ? amLODipine (NORVASC) 5 MG tablet TAKE 1 TABLET (5 MG TOTAL) BY MOUTH DAILY. (Patient taking differently: Take 5 mg by mouth daily.) 90 tablet 3  ? Blood Glucose Monitoring Suppl (ONE TOUCH ULTRA 2) w/Device KIT Use as directed 1 each 0  ? Eszopiclone 3 MG TABS Take 1 tablet (3 mg total) by mouth at bedtime. Take immediately before bedtime (Patient taking differently: Take 3 mg by mouth at bedtime. Take immediately before bedtime Per patient Prn) 90 tablet 1  ? Famotidine (PEPCID AC  PO) Take by mouth as needed.    ? feeding supplement (BOOST HIGH PROTEIN) LIQD Take 237 mLs by mouth daily.    ? glimepiride (AMARYL) 2 MG tablet Take 1 tablet (2 mg total) by mouth daily before breakfast. (Patient taking differently: Take 1 mg by mouth daily before breakfast. 1/2 of 2 mg tab) 90 tablet 3  ? glucose blood (ONETOUCH ULTRA) test strip USE TO CHECK BLOOD SUGARS TWO TIMES DAILY 100 strip 3  ? Lancets (ONETOUCH ULTRASOFT) lancets 1 each by Other route as needed for other. Use as instructed 100 each 3  ? loperamide (IMODIUM) 2 MG capsule Take 2 mg by mouth as needed for diarrhea or loose stools.     ? loratadine (CLARITIN) 10 MG tablet Take 10 mg by mouth daily as needed (allergies.).    ? prochlorperazine (COMPAZINE) 10 MG tablet TAKE 1 TABLET (10 MG TOTAL) BY MOUTH EVERY 6 (SIX) HOURS AS NEEDED (NAUSEA OR VOMITING). (Patient taking differently: every 6 (six) hours as needed.) 60 tablet 3  ? solifenacin (VESICARE) 5 MG tablet TAKE 1 TABLET (5 MG TOTAL) BY MOUTH DAILY. 90 tablet 3  ? vitamin B-12 (CYANOCOBALAMIN) 1000 MCG tablet Take 1,000 mcg by mouth  daily.     ? ?No current facility-administered medications on file prior to visit.  ? ? ?Review of Systems ? ?   ?Objective:  ? ?Vitals:  ? 05/21/21 1319 05/21/21 1353  ?BP: (!) 146/82 (!) 142/80  ?Pulse: 92   ?Temp: 98.5 ?F (36.9 ?C)   ?SpO2: 98%   ? ?BP Readings from Last 3 Encounters:  ?05/21/21 (!) 142/80  ?05/14/21 (!) 164/81  ?04/16/21 (!) 157/93  ? ?Wt Readings from Last 3 Encounters:  ?05/21/21 190 lb (86.2 kg)  ?05/14/21 188 lb 7.9 oz (85.5 kg)  ?04/16/21 186 lb 1.6 oz (84.4 kg)  ? ?Body mass index is 34.31 kg/m?. ? ?  ?Physical Exam ?Constitutional:   ?   General: She is not in acute distress. ?   Appearance: Normal appearance. She is not ill-appearing.  ?HENT:  ?   Head: Normocephalic.  ?Musculoskeletal:  ?   Right lower leg: No edema.  ?   Left lower leg: No edema.  ?   Comments: Left ankle without swelling, deformity, erythema or tenderness  with palpation.  Full range of motion without pain.  ?Skin: ?   General: Skin is warm and dry.  ?   Comments: Right first toe with mild swelling, tenderness redness/hyperpigmentation at the base of nail cuticle and lateral aspect.  No discharge with palpation.  There is a slight white discoloration indicating possible fluid  ?Neurological:  ?   Mental Status: She is alert.  ?   Sensory: No sensory deficit.  ?   Motor: No weakness.  ? ?   ? ? ? ? ? ?Assessment & Plan:  ? ? ?Left ankle pain: ?Acute ?Has been having intermittent left medial ankle pain that is a shooting sensation only lasts a couple of seconds and it feels like it shoots through the joint. ?Pain occurs when she is walking ?Not related to certain shoes ?No swelling, erythema or tenderness on exam.  Full range of motion ?Will refer to podiatry for further evaluation ? ?Diabetes, type II: ?Chronic ?Currently taking glimepiride 1 mg as needed if her sugars are elevated, but has been having low sugars and was told to stop the medication ?Has a mild infection first toenail and discussed this can elevate her sugars, but sugars are still well controlled-she will monitor them closely ? ? ?

## 2021-05-21 ENCOUNTER — Other Ambulatory Visit (HOSPITAL_BASED_OUTPATIENT_CLINIC_OR_DEPARTMENT_OTHER): Payer: Self-pay

## 2021-05-21 ENCOUNTER — Ambulatory Visit (INDEPENDENT_AMBULATORY_CARE_PROVIDER_SITE_OTHER): Payer: Medicare Other | Admitting: Internal Medicine

## 2021-05-21 ENCOUNTER — Encounter: Payer: Self-pay | Admitting: Internal Medicine

## 2021-05-21 VITALS — BP 142/80 | HR 92 | Temp 98.5°F | Ht 62.4 in | Wt 190.0 lb

## 2021-05-21 DIAGNOSIS — L03031 Cellulitis of right toe: Secondary | ICD-10-CM | POA: Insufficient documentation

## 2021-05-21 DIAGNOSIS — M25572 Pain in left ankle and joints of left foot: Secondary | ICD-10-CM

## 2021-05-21 DIAGNOSIS — E119 Type 2 diabetes mellitus without complications: Secondary | ICD-10-CM | POA: Diagnosis not present

## 2021-05-21 MED ORDER — AMOXICILLIN-POT CLAVULANATE 875-125 MG PO TABS
1.0000 | ORAL_TABLET | Freq: Two times a day (BID) | ORAL | 0 refills | Status: AC
  Filled 2021-05-21: qty 14, 7d supply, fill #0

## 2021-05-21 MED ORDER — LORAZEPAM 0.5 MG PO TABS
0.5000 mg | ORAL_TABLET | Freq: Two times a day (BID) | ORAL | 0 refills | Status: DC | PRN
Start: 2021-05-21 — End: 2022-04-04
  Filled 2021-05-21: qty 33, 16d supply, fill #0
  Filled 2021-05-21: qty 27, 14d supply, fill #0

## 2021-05-21 NOTE — Assessment & Plan Note (Signed)
Acute ?Exam consistent with paronychia ?Advised warm water soaks with Epsom salts ?Start Augmentin 875-125 mg twice daily x7 days ?She will monitor closely ?

## 2021-05-21 NOTE — Patient Instructions (Addendum)
? ? ? ?You have a mild infection at the nailbed of your toe.  Soak your toe.   ? ? ? ?Medications changes include :  Augmentin twice daily x 1 week.  Lorazepam was refilled.   ? ? ?Your prescription(s) have been sent to your pharmacy.  ? ? ?A referral was ordered for podiatry.     Someone from that office will call you to schedule an appointment.  ? ? ?Return if symptoms worsen or fail to improve. ? ? ?Paronychia ?Paronychia is an infection of the skin that surrounds a nail. It usually affects the skin around a fingernail, but it may also occur near a toenail. It often causes pain and swelling around the nail. In some cases, a collection of pus (abscess) can form near or under the nail.  ?This condition may develop suddenly, or it may develop gradually over a longer period. In most cases, paronychia is not serious, and it will clear up with treatment. ?What are the causes? ?This condition may be caused by bacteria or a fungus, such as yeast. The bacteria or fungus can enter the body through an opening in the skin, such as a cut or a hangnail, and cause an infection in your fingernail or toenail. Other causes may include: ?Recurrent injury to the fingernail or toenail area. ?Irritation of the base and sides of the nail (cuticle). ?Injury and irritation can result in inflammation, swelling, and thickened skin around the nail. ?What increases the risk? ?This condition is more likely to develop in people who: ?Get their hands wet often, such as those who work as Designer, industrial/product, bartenders, or housekeepers. ?Bite their fingernails or cuticles. ?Have underlying skin conditions. ?Have hangnails or injured fingertips. ?Are exposed to irritants like detergents and other chemicals. ?Have diabetes. ?What are the signs or symptoms? ?Symptoms of this condition include: ?Redness and swelling of the skin near the nail. ?Tenderness around the nail when you touch the area. ?Pus-filled bumps under the cuticle. ?Fluid or pus under the  nail. ?Throbbing pain in the area. ?How is this diagnosed? ?This condition is diagnosed with a physical exam. In some cases, a sample of pus may be tested to determine what type of bacteria or fungus is causing the condition. ?How is this treated? ?Treatment depends on the cause and severity of your condition. If your condition is mild, it may clear up on its own in a few days or after soaking in warm water. If needed, treatment may include: ?Antibiotic medicine, if your infection is caused by bacteria. ?Antifungal medicine, if your infection is caused by a fungus. ?A procedure to drain pus from an abscess. ?Anti-inflammatory medicine (corticosteroids). ?Removal of part of an ingrown toenail. ?A bandage (dressing) may be placed over the affected area if an abscess or part of a nail has been removed. ?Follow these instructions at home: ?Wound care ?Keep the affected area clean. ?Soak the affected area in warm water if told to do so by your health care provider. You may be told to do this for 20 minutes, 2-3 times a day. ?Keep the area dry when you are not soaking it. ?Do not try to drain an abscess yourself. ?Follow instructions from your health care provider about how to take care of the affected area. Make sure you: ?Wash your hands with soap and water for at least 20 seconds before and after you change your dressing. If soap and water are not available, use hand sanitizer. ?Change your dressing as told by your  health care provider. ?If you had an abscess drained, check the area every day for signs of infection. Check for: ?Redness, swelling, or pain. ?Fluid or blood. ?Warmth. ?Pus or a bad smell. ?Medicines ? ?Take over-the-counter and prescription medicines only as told by your health care provider. ?If you were prescribed an antibiotic medicine, take it as told by your health care provider. Do not stop taking the antibiotic even if you start to feel better. ?General instructions ?Avoid contact with any skin  irritants or allergens. ?Do not pick at the affected area. ?Keep all follow-up visits as told. This is important. ?Prevention ?To prevent this condition from happening again: ?Wear rubber gloves when washing dishes or doing other tasks that require your hands to get wet. ?Wear gloves if your hands might come in contact with cleaners or other chemicals. ?Avoid injuring your nails or fingertips. ?Do not bite your nails or tear hangnails. ?Do not cut your nails very short. ?Do not cut your cuticles. ?Use clean nail clippers or scissors when trimming nails. ?Contact a health care provider if: ?Your symptoms get worse or do not improve with treatment. ?You have continued or increased fluid, blood, or pus coming from the affected area. ?Your affected finger, toe, or joint becomes swollen or difficult to move. ?You have a fever or chills. ?There is redness spreading away from the affected area. ?Summary ?Paronychia is an infection of the skin that surrounds a nail. It often causes pain and swelling around the nail. In some cases, a collection of pus (abscess) can form near or under the nail. ?This condition may be caused by bacteria or a fungus. These germs can enter the body through an opening in the skin, such as a cut or a hangnail. ?If your condition is mild, it may clear up on its own in a few days. If needed, treatment may include medicine or a procedure to drain pus from an abscess. ?To prevent this condition from happening again, wear gloves if doing tasks that require your hands to get wet or to come in contact with chemicals. Also avoid injuring your nails or fingertips. ?This information is not intended to replace advice given to you by your health care provider. Make sure you discuss any questions you have with your health care provider. ?Document Revised: 04/02/2020 Document Reviewed: 04/02/2020 ?Elsevier Patient Education ? [employer]. ? ?

## 2021-05-22 ENCOUNTER — Encounter (HOSPITAL_COMMUNITY): Payer: Self-pay | Admitting: Hematology

## 2021-05-22 ENCOUNTER — Inpatient Hospital Stay (HOSPITAL_COMMUNITY): Payer: Medicare Other

## 2021-05-22 VITALS — BP 138/76 | HR 78 | Temp 98.3°F | Resp 18

## 2021-05-22 DIAGNOSIS — E119 Type 2 diabetes mellitus without complications: Secondary | ICD-10-CM | POA: Diagnosis not present

## 2021-05-22 DIAGNOSIS — Z803 Family history of malignant neoplasm of breast: Secondary | ICD-10-CM | POA: Diagnosis not present

## 2021-05-22 DIAGNOSIS — C569 Malignant neoplasm of unspecified ovary: Secondary | ICD-10-CM

## 2021-05-22 DIAGNOSIS — Z808 Family history of malignant neoplasm of other organs or systems: Secondary | ICD-10-CM | POA: Diagnosis not present

## 2021-05-22 DIAGNOSIS — R5383 Other fatigue: Secondary | ICD-10-CM | POA: Diagnosis not present

## 2021-05-22 DIAGNOSIS — Z5111 Encounter for antineoplastic chemotherapy: Secondary | ICD-10-CM | POA: Diagnosis not present

## 2021-05-22 DIAGNOSIS — Z95828 Presence of other vascular implants and grafts: Secondary | ICD-10-CM

## 2021-05-22 DIAGNOSIS — I10 Essential (primary) hypertension: Secondary | ICD-10-CM | POA: Diagnosis not present

## 2021-05-22 DIAGNOSIS — C563 Malignant neoplasm of bilateral ovaries: Secondary | ICD-10-CM

## 2021-05-22 DIAGNOSIS — Z5189 Encounter for other specified aftercare: Secondary | ICD-10-CM | POA: Diagnosis not present

## 2021-05-22 LAB — CBC WITH DIFFERENTIAL/PLATELET
Abs Immature Granulocytes: 0.03 10*3/uL (ref 0.00–0.07)
Basophils Absolute: 0 10*3/uL (ref 0.0–0.1)
Basophils Relative: 0 %
Eosinophils Absolute: 0.1 10*3/uL (ref 0.0–0.5)
Eosinophils Relative: 2 %
HCT: 32.7 % — ABNORMAL LOW (ref 36.0–46.0)
Hemoglobin: 10.7 g/dL — ABNORMAL LOW (ref 12.0–15.0)
Immature Granulocytes: 1 %
Lymphocytes Relative: 22 %
Lymphs Abs: 1.2 10*3/uL (ref 0.7–4.0)
MCH: 29.8 pg (ref 26.0–34.0)
MCHC: 32.7 g/dL (ref 30.0–36.0)
MCV: 91.1 fL (ref 80.0–100.0)
Monocytes Absolute: 0.4 10*3/uL (ref 0.1–1.0)
Monocytes Relative: 7 %
Neutro Abs: 3.8 10*3/uL (ref 1.7–7.7)
Neutrophils Relative %: 68 %
Platelets: 203 10*3/uL (ref 150–400)
RBC: 3.59 MIL/uL — ABNORMAL LOW (ref 3.87–5.11)
RDW: 12.7 % (ref 11.5–15.5)
WBC: 5.5 10*3/uL (ref 4.0–10.5)
nRBC: 0 % (ref 0.0–0.2)

## 2021-05-22 LAB — COMPREHENSIVE METABOLIC PANEL
ALT: 13 U/L (ref 0–44)
AST: 18 U/L (ref 15–41)
Albumin: 4.2 g/dL (ref 3.5–5.0)
Alkaline Phosphatase: 66 U/L (ref 38–126)
Anion gap: 8 (ref 5–15)
BUN: 27 mg/dL — ABNORMAL HIGH (ref 8–23)
CO2: 21 mmol/L — ABNORMAL LOW (ref 22–32)
Calcium: 9.9 mg/dL (ref 8.9–10.3)
Chloride: 111 mmol/L (ref 98–111)
Creatinine, Ser: 1.43 mg/dL — ABNORMAL HIGH (ref 0.44–1.00)
GFR, Estimated: 38 mL/min — ABNORMAL LOW (ref >60)
Glucose, Bld: 204 mg/dL — ABNORMAL HIGH (ref 70–99)
Potassium: 3.9 mmol/L (ref 3.5–5.1)
Sodium: 140 mmol/L (ref 135–145)
Total Bilirubin: 0.3 mg/dL (ref 0.3–1.2)
Total Protein: 7.2 g/dL (ref 6.5–8.1)

## 2021-05-22 LAB — MAGNESIUM: Magnesium: 1.9 mg/dL (ref 1.7–2.4)

## 2021-05-22 MED ORDER — SODIUM CHLORIDE 0.9 % IV SOLN: Freq: Once | INTRAVENOUS | Status: AC

## 2021-05-22 MED ORDER — PEGFILGRASTIM 6 MG/0.6ML ~~LOC~~ PSKT
6.0000 mg | PREFILLED_SYRINGE | Freq: Once | SUBCUTANEOUS | Status: AC
  Administered 2021-05-22: 6 mg via SUBCUTANEOUS
  Filled 2021-05-22: qty 0.6

## 2021-05-22 MED ORDER — SODIUM CHLORIDE 0.9% FLUSH
10.0000 mL | INTRAVENOUS | Status: DC | PRN
  Administered 2021-05-22: 10 mL

## 2021-05-22 MED ORDER — HEPARIN SOD (PORK) LOCK FLUSH 100 UNIT/ML IV SOLN
500.0000 [IU] | Freq: Once | INTRAVENOUS | Status: AC | PRN
  Administered 2021-05-22: 500 [IU]

## 2021-05-22 MED ORDER — DIPHENHYDRAMINE HCL 50 MG/ML IJ SOLN
50.0000 mg | Freq: Once | INTRAMUSCULAR | Status: AC
  Administered 2021-05-22: 50 mg via INTRAVENOUS
  Filled 2021-05-22: qty 1

## 2021-05-22 MED ORDER — SODIUM CHLORIDE 0.9 % IV SOLN
361.5000 mg | Freq: Once | INTRAVENOUS | Status: AC
  Administered 2021-05-22: 360 mg via INTRAVENOUS
  Filled 2021-05-22: qty 36

## 2021-05-22 MED ORDER — SODIUM CHLORIDE 0.9 % IV SOLN
10.0000 mg | Freq: Once | INTRAVENOUS | Status: AC
  Administered 2021-05-22: 10 mg via INTRAVENOUS
  Filled 2021-05-22: qty 10

## 2021-05-22 MED ORDER — SODIUM CHLORIDE 0.9 % IV SOLN
140.0000 mg/m2 | Freq: Once | INTRAVENOUS | Status: AC
  Administered 2021-05-22: 282 mg via INTRAVENOUS
  Filled 2021-05-22: qty 47

## 2021-05-22 MED ORDER — SODIUM CHLORIDE 0.9 % IV SOLN
150.0000 mg | Freq: Once | INTRAVENOUS | Status: AC
  Administered 2021-05-22: 150 mg via INTRAVENOUS
  Filled 2021-05-22: qty 150

## 2021-05-22 MED ORDER — PALONOSETRON HCL INJECTION 0.25 MG/5ML
0.2500 mg | Freq: Once | INTRAVENOUS | Status: AC
  Administered 2021-05-22: 0.25 mg via INTRAVENOUS
  Filled 2021-05-22: qty 5

## 2021-05-22 MED ORDER — LORAZEPAM 2 MG/ML IJ SOLN
1.0000 mg | Freq: Once | INTRAMUSCULAR | Status: AC
  Administered 2021-05-22: 1 mg via INTRAVENOUS
  Filled 2021-05-22: qty 1

## 2021-05-22 MED ORDER — FAMOTIDINE IN NACL 20-0.9 MG/50ML-% IV SOLN
20.0000 mg | Freq: Once | INTRAVENOUS | Status: AC
  Administered 2021-05-22: 20 mg via INTRAVENOUS
  Filled 2021-05-22: qty 50

## 2021-05-22 NOTE — Progress Notes (Signed)
Patient presents today for D1C1 Taxol and Carboplatin restart.  Vital signs and labs are within parameters for treatment.    ?Patient is having some anxiety and asking for something.  [place] PA notified and 1 mg of Ativan IV ordered and given. ? ?Per Pharmacist titration not required for Taxol since patient has had this in the past and has done well but agreed with the pharmacist that 30 minutes of titration before full rate would be appropriate since it has been two years since last treatment.  ? ?On pro placed and flashing green light noted.  Patient educated on the do's and don'ts and is aware of when the medication will be injected and when she can remove the device.   ? ?Taxol/Carboplatin infusion given today per MD orders.  Tolerated infusion without adverse affects.  Vital signs stable.  No complaints at this time.  Discharge from clinic ambulatory in stable condition.  Alert and oriented X 3.  Follow up with [facility] as scheduled.  ?  ?

## 2021-05-22 NOTE — Patient Instructions (Signed)
[facility]  Discharge Instructions: ?Thank you for choosing [facility] to provide your oncology and hematology care.  ?If you have a lab appointment with the [facility], please come in thru the Main Entrance and check in at the main information desk. ? ?Wear comfortable clothing and clothing appropriate for easy access to any Portacath or PICC line.  ? ?We strive to give you quality time with your provider. You may need to reschedule your appointment if you arrive late (15 or more minutes).  Arriving late affects you and other patients whose appointments are after yours.  Also, if you miss three or more appointments without notifying the office, you may be dismissed from the clinic at the provider?s discretion.    ?  ?For prescription refill requests, have your pharmacy contact our office and allow 72 hours for refills to be completed.   ? ?Today you received the following chemotherapy and/or immunotherapy agents Taxol/Carboplatin/OnPro    ?  ?To help prevent nausea and vomiting after your treatment, we encourage you to take your nausea medication as directed. ? ?BELOW ARE SYMPTOMS THAT SHOULD BE REPORTED IMMEDIATELY: ?*FEVER GREATER THAN 100.4 F (38 ?C) OR HIGHER ?*CHILLS OR SWEATING ?*NAUSEA AND VOMITING THAT IS NOT CONTROLLED WITH YOUR NAUSEA MEDICATION ?*UNUSUAL SHORTNESS OF BREATH ?*UNUSUAL BRUISING OR BLEEDING ?*URINARY PROBLEMS (pain or burning when urinating, or frequent urination) ?*BOWEL PROBLEMS (unusual diarrhea, constipation, pain near the anus) ?TENDERNESS IN MOUTH AND THROAT WITH OR WITHOUT PRESENCE OF ULCERS (sore throat, sores in mouth, or a toothache) ?UNUSUAL RASH, SWELLING OR PAIN  ?UNUSUAL VAGINAL DISCHARGE OR ITCHING  ? ?Items with * indicate a potential emergency and should be followed up as soon as possible or go to the Emergency Department if any problems should occur. ? ?Please show the CHEMOTHERAPY ALERT CARD or IMMUNOTHERAPY ALERT CARD at check-in to the  Emergency Department and triage nurse. ? ?Should you have questions after your visit or need to cancel or reschedule your appointment, please contact [facility] [phone]  and follow the prompts.  Office hours are 8:00 a.m. to 4:30 p.m. Monday - Friday. Please note that voicemails left after 4:00 p.m. may not be returned until the following business day.  We are closed weekends and major holidays. You have access to a nurse at all times for urgent questions. Please call the main number to the clinic [phone] and follow the prompts. ? ?For any non-urgent questions, you may also contact your provider using MyChart. We now offer e-Visits for anyone 27 and older to request care online for non-urgent symptoms. For details visit [email or web address]. ?  ?Also download the MyChart app! Go to the app store, search "MyChart", open the app, select [facility], and log in with your MyChart username and password. ? ?Due to Covid, a mask is required upon entering the hospital/clinic. If you do not have a mask, one will be given to you upon arrival. For doctor visits, patients may have 1 support person aged 71 or older with them. For treatment visits, patients cannot have anyone with them due to current Covid guidelines and our immunocompromised population.  ?

## 2021-05-23 ENCOUNTER — Ambulatory Visit: Payer: Medicare Other

## 2021-05-23 LAB — CA 125: Cancer Antigen (CA) 125: 9.6 U/mL (ref 0.0–38.1)

## 2021-05-26 ENCOUNTER — Encounter (HOSPITAL_COMMUNITY): Payer: Self-pay | Admitting: *Deleted

## 2021-05-26 ENCOUNTER — Other Ambulatory Visit: Payer: Self-pay

## 2021-05-26 ENCOUNTER — Emergency Department (HOSPITAL_COMMUNITY): Payer: Medicare Other

## 2021-05-26 ENCOUNTER — Emergency Department (HOSPITAL_COMMUNITY)
Admission: EM | Admit: 2021-05-26 | Discharge: 2021-05-26 | Discharge status: Against Medical Advice | Payer: Medicare Other | Attending: Emergency Medicine | Admitting: Emergency Medicine

## 2021-05-26 DIAGNOSIS — Z5321 Procedure and treatment not carried out due to patient leaving prior to being seen by health care provider: Secondary | ICD-10-CM | POA: Insufficient documentation

## 2021-05-26 DIAGNOSIS — R109 Unspecified abdominal pain: Secondary | ICD-10-CM | POA: Diagnosis not present

## 2021-05-26 DIAGNOSIS — R0602 Shortness of breath: Secondary | ICD-10-CM | POA: Insufficient documentation

## 2021-05-26 NOTE — ED Notes (Signed)
Last chemo done here on May 10th per pt.  ?

## 2021-05-26 NOTE — ED Triage Notes (Signed)
Pt with SOB with exertion such as walking for past 2 days.  Had some aching to her stomach earlier today but denies at present. Denies cough ?

## 2021-05-28 ENCOUNTER — Ambulatory Visit: Payer: Medicare Other

## 2021-05-31 ENCOUNTER — Ambulatory Visit: Payer: Medicaid Other | Admitting: Podiatry

## 2021-05-31 ENCOUNTER — Encounter: Payer: Self-pay | Admitting: Gynecologic Oncology

## 2021-06-06 ENCOUNTER — Inpatient Hospital Stay (HOSPITAL_COMMUNITY): Payer: Medicare Other

## 2021-06-06 ENCOUNTER — Inpatient Hospital Stay (HOSPITAL_BASED_OUTPATIENT_CLINIC_OR_DEPARTMENT_OTHER): Payer: Medicare Other | Admitting: Physician Assistant

## 2021-06-06 VITALS — BP 161/75 | HR 87 | Temp 97.8°F | Resp 16 | Ht 62.0 in | Wt 190.3 lb

## 2021-06-06 DIAGNOSIS — E119 Type 2 diabetes mellitus without complications: Secondary | ICD-10-CM | POA: Diagnosis not present

## 2021-06-06 DIAGNOSIS — I10 Essential (primary) hypertension: Secondary | ICD-10-CM | POA: Diagnosis not present

## 2021-06-06 DIAGNOSIS — Z5189 Encounter for other specified aftercare: Secondary | ICD-10-CM | POA: Diagnosis not present

## 2021-06-06 DIAGNOSIS — Z803 Family history of malignant neoplasm of breast: Secondary | ICD-10-CM | POA: Diagnosis not present

## 2021-06-06 DIAGNOSIS — Z5111 Encounter for antineoplastic chemotherapy: Secondary | ICD-10-CM | POA: Diagnosis not present

## 2021-06-06 DIAGNOSIS — R5383 Other fatigue: Secondary | ICD-10-CM | POA: Diagnosis not present

## 2021-06-06 DIAGNOSIS — Z808 Family history of malignant neoplasm of other organs or systems: Secondary | ICD-10-CM | POA: Diagnosis not present

## 2021-06-06 DIAGNOSIS — Z09 Encounter for follow-up examination after completed treatment for conditions other than malignant neoplasm: Secondary | ICD-10-CM | POA: Diagnosis not present

## 2021-06-06 DIAGNOSIS — C569 Malignant neoplasm of unspecified ovary: Secondary | ICD-10-CM

## 2021-06-06 DIAGNOSIS — C563 Malignant neoplasm of bilateral ovaries: Secondary | ICD-10-CM | POA: Diagnosis not present

## 2021-06-06 LAB — COMPREHENSIVE METABOLIC PANEL
ALT: 16 U/L (ref 0–44)
AST: 17 U/L (ref 15–41)
Albumin: 4.1 g/dL (ref 3.5–5.0)
Alkaline Phosphatase: 85 U/L (ref 38–126)
Anion gap: 3 — ABNORMAL LOW (ref 5–15)
BUN: 23 mg/dL (ref 8–23)
CO2: 23 mmol/L (ref 22–32)
Calcium: 9.3 mg/dL (ref 8.9–10.3)
Chloride: 111 mmol/L (ref 98–111)
Creatinine, Ser: 1.08 mg/dL — ABNORMAL HIGH (ref 0.44–1.00)
GFR, Estimated: 53 mL/min — ABNORMAL LOW (ref >60)
Glucose, Bld: 160 mg/dL — ABNORMAL HIGH (ref 70–99)
Potassium: 3.9 mmol/L (ref 3.5–5.1)
Sodium: 137 mmol/L (ref 135–145)
Total Bilirubin: 0.2 mg/dL — ABNORMAL LOW (ref 0.3–1.2)
Total Protein: 7 g/dL (ref 6.5–8.1)

## 2021-06-06 LAB — CBC WITH DIFFERENTIAL/PLATELET
Abs Immature Granulocytes: 0.16 10*3/uL — ABNORMAL HIGH (ref 0.00–0.07)
Basophils Absolute: 0 10*3/uL (ref 0.0–0.1)
Basophils Relative: 0 %
Eosinophils Absolute: 0.1 10*3/uL (ref 0.0–0.5)
Eosinophils Relative: 1 %
HCT: 30.7 % — ABNORMAL LOW (ref 36.0–46.0)
Hemoglobin: 10.3 g/dL — ABNORMAL LOW (ref 12.0–15.0)
Immature Granulocytes: 2 %
Lymphocytes Relative: 14 %
Lymphs Abs: 1.4 10*3/uL (ref 0.7–4.0)
MCH: 30.3 pg (ref 26.0–34.0)
MCHC: 33.6 g/dL (ref 30.0–36.0)
MCV: 90.3 fL (ref 80.0–100.0)
Monocytes Absolute: 0.6 10*3/uL (ref 0.1–1.0)
Monocytes Relative: 6 %
Neutro Abs: 7.9 10*3/uL — ABNORMAL HIGH (ref 1.7–7.7)
Neutrophils Relative %: 77 %
Platelets: 200 10*3/uL (ref 150–400)
RBC: 3.4 MIL/uL — ABNORMAL LOW (ref 3.87–5.11)
RDW: 13.7 % (ref 11.5–15.5)
WBC: 10.3 10*3/uL (ref 4.0–10.5)
nRBC: 0 % (ref 0.0–0.2)

## 2021-06-06 LAB — MAGNESIUM: Magnesium: 2 mg/dL (ref 1.7–2.4)

## 2021-06-06 MED ORDER — SODIUM CHLORIDE 0.9% FLUSH
10.0000 mL | Freq: Once | INTRAVENOUS | Status: AC
  Administered 2021-06-06: 10 mL via INTRAVENOUS

## 2021-06-06 MED ORDER — HEPARIN SOD (PORK) LOCK FLUSH 100 UNIT/ML IV SOLN
500.0000 [IU] | Freq: Once | INTRAVENOUS | Status: AC
  Administered 2021-06-06: 500 [IU] via INTRAVENOUS

## 2021-06-06 NOTE — Patient Instructions (Signed)
[facility] at [facility] Discharge Instructions  You were seen today by Tarri Abernethy PA-C for your chemotherapy follow-up visit.  Overall, you have done very well with your first round of chemotherapy!  It is normal and expected for you to experience fatigue after your chemotherapy.  Allow yourself to get plenty of rest at home, but be as active as you feel that you are able to be.  Your labs looked great today.  Keep up the great work with drinking plenty of water and getting adequate nutrition!  FOLLOW-UP APPOINTMENT: You are scheduled for your next visit with Dr. Delton Coombes and your next cycle of chemotherapy on 06/12/2021.    Thank you for choosing [facility] at [facility] to provide your oncology and hematology care.  To afford each patient quality time with our provider, please arrive at least 15 minutes before your scheduled appointment time.   If you have a lab appointment with the [facility] please come in thru the Main Entrance and check in at the main information desk.  You need to re-schedule your appointment should you arrive 10 or more minutes late.  We strive to give you quality time with our providers, and arriving late affects you and other patients whose appointments are after yours.  Also, if you no show three or more times for appointments you may be dismissed from the clinic at the providers discretion.     Again, thank you for choosing [facility].  Our hope is that these requests will decrease the amount of time that you wait before being seen by our physicians.       _____________________________________________________________  Should you have questions after your visit to [facility], please contact our office at [phone] and follow the prompts.  Our office hours are 8:00 a.m. and 4:30 p.m. Monday - Friday.  Please note that voicemails left after 4:00 p.m. may not be returned until  the following business day.  We are closed weekends and major holidays.  You do have access to a nurse 24-7, just call the main number to the clinic [phone] and do not press any options, hold on the line and a nurse will answer the phone.    For prescription refill requests, have your pharmacy contact our office and allow 72 hours.    Due to Covid, you will need to wear a mask upon entering the hospital. If you do not have a mask, a mask will be given to you at the Main Entrance upon arrival. For doctor visits, patients may have 1 support person age 43 or older with them. For treatment visits, patients can not have anyone with them due to social distancing guidelines and our immunocompromised population.

## 2021-06-06 NOTE — Progress Notes (Signed)
Port flushed with good blood return noted. Labs drawn. No bruising or swelling at site. Bandaid applied and patient discharged in satisfactory condition. VVS stable with no signs or symptoms of distressed noted.  

## 2021-06-06 NOTE — Patient Instructions (Signed)
[facility]  Discharge Instructions: Thank you for choosing [facility] to provide your oncology and hematology care.  If you have a lab appointment with the Cancer Center, please come in thru the Main Entrance and check in at the main information desk.  Wear comfortable clothing and clothing appropriate for easy access to any Portacath or PICC line.   We strive to give you quality time with your provider. You may need to reschedule your appointment if you arrive late (15 or more minutes).  Arriving late affects you and other patients whose appointments are after yours.  Also, if you miss three or more appointments without notifying the office, you may be dismissed from the clinic at the provider's discretion.      For prescription refill requests, have your pharmacy contact our office and allow 72 hours for refills to be completed.    Today your port was flushed and labs were drawn, return as scheduled.   To help prevent nausea and vomiting after your treatment, we encourage you to take your nausea medication as directed.  BELOW ARE SYMPTOMS THAT SHOULD BE REPORTED IMMEDIATELY: *FEVER GREATER THAN 100.4 F (38 C) OR HIGHER *CHILLS OR SWEATING *NAUSEA AND VOMITING THAT IS NOT CONTROLLED WITH YOUR NAUSEA MEDICATION *UNUSUAL SHORTNESS OF BREATH *UNUSUAL BRUISING OR BLEEDING *URINARY PROBLEMS (pain or burning when urinating, or frequent urination) *BOWEL PROBLEMS (unusual diarrhea, constipation, pain near the anus) TENDERNESS IN MOUTH AND THROAT WITH OR WITHOUT PRESENCE OF ULCERS (sore throat, sores in mouth, or a toothache) UNUSUAL RASH, SWELLING OR PAIN  UNUSUAL VAGINAL DISCHARGE OR ITCHING   Items with * indicate a potential emergency and should be followed up as soon as possible or go to the Emergency Department if any problems should occur.  Please show the CHEMOTHERAPY ALERT CARD or IMMUNOTHERAPY ALERT CARD at check-in to the Emergency Department and triage  nurse.  Should you have questions after your visit or need to cancel or reschedule your appointment, please contact [facility] [phone]  and follow the prompts.  Office hours are 8:00 a.m. to 4:30 p.m. Monday - Friday. Please note that voicemails left after 4:00 p.m. may not be returned until the following business day.  We are closed weekends and major holidays. You have access to a nurse at all times for urgent questions. Please call the main number to the clinic [phone] and follow the prompts.  For any non-urgent questions, you may also contact your provider using MyChart. We now offer e-Visits for anyone 18 and older to request care online for non-urgent symptoms. For details visit [email or web address].   Also download the MyChart app! Go to the app store, search "MyChart", open the app, select [facility], and log in with your MyChart username and password.  Due to Covid, a mask is required upon entering the hospital/clinic. If you do not have a mask, one will be given to you upon arrival. For doctor visits, patients may have 1 support person aged 18 or older with them. For treatment visits, patients cannot have anyone with them due to current Covid guidelines and our immunocompromised population.  

## 2021-06-06 NOTE — Progress Notes (Signed)
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 The report date is 07/11/2019.  The Multi-Cancer Panel offered by [facility] includes sequencing and/or deletion duplication testing of the following 85 genes: AIP, ALK, APC, ATM, AXIN2,BAP1,  BARD1, BLM, BMPR1A, BRCA1, BRCA2, BRIP1, CASR, CDC73, CDH1, CDK4, CDKN1B, CDKN1C, CDKN2A (p14ARF), CDKN2A (p16INK4a), CEBPA, CHEK2, CTNNA1, DICER1, DIS3L2, EGFR (c.2369C>T, p.Thr790Met variant only), EPCAM (Deletion/duplication testing only), FH, FLCN, GATA2, GPC3, GREM1 (Promoter region deletion/duplication testing only), HOXB13 (c.251G>A, p.Gly84Glu), HRAS, KIT, MAX, MEN1, MET, MITF (c.952G>A, p.Glu318Lys variant only), MLH1, MSH2, MSH3, MSH6, MUTYH, NBN, NF1, NF2, NTHL1, PALB2, PDGFRA, PHOX2B, PMS2, POLD1, POLE, POT1, PRKAR1A, PTCH1, PTEN, RAD50, RAD51C, RAD51D, RB1, RECQL4, RET, RNF43, RUNX1, SDHAF2, SDHA (sequence changes only), SDHB, SDHC, SDHD, SMAD4, SMARCA4, SMARCB1, SMARCE1, STK11, SUFU, TERC, TERT, TMEM127, TP53, TSC1, TSC2, VHL, WRN and WT1.   07/28/2019 -  Chemotherapy   Patient is on Treatment Plan : OVARIAN Carboplatin (AUC 6) / Paclitaxel (175) q21d x 6 cycles        CANCER STAGING: Cancer Staging  No matching staging information was found for the  patient.  INTERVAL HISTORY:  [name], a 76 y.o. female, is managed by Dr. 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  She received first cycle of treatment with paclitaxel and carboplatin on 05/22/2021. She is seen today for toxicity check and follow-up of new start chemo.  She has been feeling fatigued and "wiped out" after receiving treatment.  She states that she has been fatigued ever since she had radiation in January, but that she was just starting to get some of her strength back before chemotherapy "knocked the strength out of her again."  She reports little to no energy and has been unable to keep up with her ADLs.  She is still able to dress and bathe herself, but feels extremely fatigued after.  Her granddaughter has been staying with her this week to offer extra assistance around the house.  She reports dyspnea on exertion, but denies any shortness of breath at rest, cough, or chest pain. She went to the emergency department due to dyspnea on exertion on 05/26/2021, but left before being seen by a provider after "waiting several hours in the waiting room."  She did have EKG and chest x-ray done, which were both within normal limits.  She admits to having had 2 episodes of diarrhea over the past 2 weeks.  She has not had any nausea, vomiting, or constipation.  She denies any mouth sores. She had diffuse aching body pain after receiving her chemotherapy and G-CSF on 05/22/2021, which was self-limited and lasted less than 2 days.  She reports that she is hydrating well, and is drinking at least 64 ounces of water daily. She reports that she has decent appetite.  She is not able to eat as  much in one sitting, but is maintaining adequate nutritional intake. Her weight today is stable at 190 pounds (was 191 pounds on 05/22/2021).   REVIEW OF SYSTEMS:  Review of Systems  Constitutional:  Positive for fatigue. Negative for appetite change,  chills, diaphoresis, fever and unexpected weight change.  HENT:   Negative for lump/mass and nosebleeds.   Eyes:  Negative for eye problems.  Respiratory:  Positive for shortness of breath (with exertion). Negative for cough and hemoptysis.   Cardiovascular:  Negative for chest pain, leg swelling and palpitations.  Gastrointestinal:  Positive for diarrhea (x2, resolved). Negative for abdominal pain, blood in stool, constipation, nausea and vomiting.  Genitourinary:  Positive for frequency. Negative for hematuria.   Musculoskeletal:  Positive for myalgias (diffuse body aches of chemo/GCSF, resolved).  Skin: Negative.   Neurological:  Positive for dizziness and headaches (mild). Negative for light-headedness.  Hematological:  Does not bruise/bleed easily.  Psychiatric/Behavioral:  The patient is nervous/anxious.     PAST MEDICAL/SURGICAL HISTORY:  Past Medical History:  Diagnosis Date   Anemia    Anxiety    Arthritis    back of neck, bones spurs on neck   Cancer (HCC)    ovarian cancer   Cataract both eyes    Cervical disc disease    Diabetes mellitus Type 2    Family history of adverse reaction to anesthesia    sister slow to awaken   Family history of thyroid cancer    GERD (gastroesophageal reflux disease)    Heart murmur    History of COVID-19 06/2020   pain in side took paxlovid x 5 days all symptoms resolved   History of radiation therapy    abdominal lymph nodes 01/21/2021-02/01/2021  Dr Gery Pray   Hydronephrosis 08/24/2020   Right Kidney (stable per CT)   Hyperlipidemia    Hypertension    IBS (irritable bowel syndrome)    Left shoulder frozen with limited rom    Mucoid cyst of joint    right thumb   Neuropathy 03/12/2021   hands/fingers both hands numb and tingle @ times   Port-A-Cath in place 07/21/2019   Reflux    Sleep apnea 03/12/2021   has not used cpap in 3 years   Vertigo 12/2010   none since treated at [facility]   Wears glasses for reading    Wears  partial dentures upper    Past Surgical History:  Procedure Laterality Date   BLADDER SURGERY     [facility]   BREAST EXCISIONAL BIOPSY Bilateral    BREAST SURGERY     fibroid cyst removed ? over 10 yrs ago at [facility] per pt on 03-12-2021   CHOLECYSTECTOMY     yrs ago   COLONOSCOPY  07/02/2020   and 09-19-2016   CYSTOSCOPY W/ URETERAL STENT PLACEMENT Right 03/13/2021   Procedure: CYSTOSCOPY WITH RETROGRADE PYELOGRAM/URETERAL STENT PLACEMENT;  Surgeon: Robley Fries, MD;  Location: [facility];  Service: Urology;  Laterality: Right;  30 MINS   fibroids removed     breast (both breasts) age 60 and total of 3 surgeries   history of chemotherapy     6 rounds june 2021   IR IMAGING GUIDED PORT INSERTION  07/26/2019   right   MASS EXCISION Right 06/26/2016   Procedure: EXCISION MUCOID TUMOR RIGHT THUMB, IP RIGHT THUMB;  Surgeon: Daryll Brod, MD;  Location: [facility];  Service: Orthopedics;  Laterality: Right;   ROBOTIC ASSISTED BILATERAL  SALPINGO OOPHERECTOMY N/A 01/03/2020   Procedure: XI ROBOTIC ASSISTED BILATERAL SALPINGO OOPHORECTOMY, RADICAL TUMOR DEBULKING;  Surgeon: Everitt Amber, MD;  Location: [facility];  Service: Gynecology;  Laterality: N/A;   ROBOTIC PELVIC AND PARA-AORTIC LYMPH NODE DISSECTION N/A 01/03/2020   Procedure: XI ROBOTIC PARA-AORTIC LYMPHADENECTOMY;  Surgeon: Everitt Amber, MD;  Location: [facility];  Service: Gynecology;  Laterality: N/A;   VAGINAL HYSTERECTOMY     age late 50's    SOCIAL HISTORY:  Social History   Socioeconomic History   Marital status: Divorced    Spouse name: Not on file   Number of children: 4   Years of education: 16   Highest education level: Bachelor's degree (e.g., BA, AB, BS)  Occupational History   Occupation: retired Geographical information systems officer  Tobacco Use   Smoking status: Never   Smokeless tobacco: Never  Vaping Use   Vaping Use: Never used  Substance and Sexual Activity   Alcohol use: No     Alcohol/week: 0.0 standard drinks   Drug use: No   Sexual activity: Not Currently  Other Topics Concern   Not on file  Social History Narrative   Not on file   Social Determinants of Health   Financial Resource Strain: Low Risk    Difficulty of Paying Living Expenses: Not hard at all  Food Insecurity: No Food Insecurity   Worried About Charity fundraiser in the Last Year: Never true   [place] in the Last Year: Never true  Transportation Needs: No Transportation Needs   Lack of Transportation (Medical): No   Lack of Transportation (Non-Medical): No  Physical Activity: Insufficiently Active   Days of Exercise per Week: 2 days   Minutes of Exercise per Session: 60 min  Stress: No Stress Concern Present   Feeling of Stress : Not at all  Social Connections: Moderately Integrated   Frequency of Communication with Friends and Family: More than three times a week   Frequency of Social Gatherings with Friends and Family: Once a week   Attends Religious Services: More than 4 times per year   Active Member of [employer] or Organizations: Yes   Attends Music therapist: More than 4 times per year   Marital Status: Divorced  Human resources officer Violence: Not At Risk   Fear of Current or Ex-Partner: No   Emotionally Abused: No   Physically Abused: No   Sexually Abused: No    FAMILY HISTORY: Family history has been reviewed by me and is documented elsewhere in the electronic medical record.  CURRENT MEDICATIONS: Current medications have been reviewed by me and are documented elsewhere in electronic medical record.  ALLERGIES: Drug allergies have been reviewed by me and are documented elsewhere in the electronic medical record.  PHYSICAL EXAM:  Performance status (ECOG): 3 - Symptomatic, >50% confined to bed Vitals:   06/06/21 1350  BP: (!) 161/75  Pulse: 87  Resp: 16  Temp: 97.8 F (36.6 C)  SpO2: 100%   Wt Readings from Last 3 Encounters:  05/26/21 190 lb (86.2  kg)  05/22/21 191 lb 1.6 oz (86.7 kg)  05/21/21 190 lb (86.2 kg)   Physical Exam Vitals reviewed.  Constitutional:      Appearance: Normal appearance. She is obese.  Cardiovascular:     Rate and Rhythm: Normal rate and regular rhythm.     Pulses: Normal pulses.     Heart sounds: Normal heart sounds.  Pulmonary:     Effort: Pulmonary effort  is normal.     Breath sounds: Normal breath sounds.  Neurological:     General: No focal deficit present.     Mental Status: She is alert and oriented to person, place, and time.  Psychiatric:        Mood and Affect: Mood normal.        Behavior: Behavior normal.     LABORATORY DATA: Relevant labs have been reviewed by me and are documented elsewhere in the electronic medical record.  DIAGNOSTIC IMAGING: Relevant imaging has been reviewed by me and are documented elsewhere in the electronic medical record.  ASSESSMENT & PLAN: 1.  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 Delton Coombes - Received first cycle of treatment with carboplatin and paclitaxel on 05/22/2021.  She received Neulasta on 05/22/2021. - Main side effect has been increased fatigue and dyspnea on exertion - CBC (06/06/21 ): Normocytic anemia stable with Hgb 10.3/MCV 90.3.  Normal platelets 200.  Normal WBC 10.3 with ANC 7.9. - CMP (06/06/21): Normal potassium and magnesium.  Creatinine 1.08, improved from baseline.  Normal LFTs. - PLAN:  Patient is scheduled for labs, MD visit with Dr. Delton Coombes, and next cycle of treatment on 06/12/2021  2.  Fatigue -Significant fatigue, requiring assistance with ADLs - Fatigue has been ongoing since radiation, but worsened with chemotherapy - Patient has some anxiety around her fatigue, states that she "does not understand why she is so tired, and worries that she is wasting away and about to die" - PLAN: Reassurance offered the fatigue is an expected and normal side effect of  chemotherapy.  Patient encouraged to rest is much as she needs to, but to be active is much as she is able to.  3.  Nutrition & hydration - Patient is drinking at least 64 ounces of water daily. - She is maintaining adequate nutrition. - Weight today is 190 pounds, no significant weight loss after starting chemotherapy. - PLAN: No indication for IV fluids or dietitian referral at this time.   All questions were answered. The patient knows to call the clinic with any problems, questions or concerns.  Medical decision making: Moderate  Time spent on visit: I spent 20 minutes counseling the patient face to face. The total time spent in the appointment was 30 minutes and more than 50% was on counseling.   Laura Rush, PA-C  06/06/2021 4:00 PM

## 2021-06-07 ENCOUNTER — Telehealth: Payer: Self-pay | Admitting: Gynecologic Oncology

## 2021-06-07 ENCOUNTER — Inpatient Hospital Stay: Payer: Medicare Other | Admitting: Gynecologic Oncology

## 2021-06-07 DIAGNOSIS — C569 Malignant neoplasm of unspecified ovary: Secondary | ICD-10-CM

## 2021-06-07 NOTE — Telephone Encounter (Signed)
Called the patient to discuss her appointment this afternoon.  She was still at home.  Appointment was made before most recent CT scan which showed slow progression of her ovarian cancer.  She has now started chemotherapy in the setting of platinum sensitive recurrent ovarian cancer.  Discussed that visit today is unnecessary but that I will contact her medical oncologist.  Happy to see her or have a phone visit after her interval imaging or when she completes chemotherapy.

## 2021-06-09 ENCOUNTER — Encounter (HOSPITAL_COMMUNITY): Payer: Self-pay | Admitting: Hematology

## 2021-06-10 ENCOUNTER — Encounter (HOSPITAL_COMMUNITY): Payer: Self-pay

## 2021-06-12 ENCOUNTER — Inpatient Hospital Stay (HOSPITAL_BASED_OUTPATIENT_CLINIC_OR_DEPARTMENT_OTHER): Payer: Medicare Other | Admitting: Hematology

## 2021-06-12 ENCOUNTER — Inpatient Hospital Stay (HOSPITAL_COMMUNITY): Payer: Medicare Other

## 2021-06-12 VITALS — BP 156/68 | HR 87 | Temp 98.2°F | Resp 18 | Ht 62.0 in | Wt 191.2 lb

## 2021-06-12 VITALS — BP 163/85 | HR 98 | Temp 98.0°F | Resp 18

## 2021-06-12 DIAGNOSIS — R300 Dysuria: Secondary | ICD-10-CM

## 2021-06-12 DIAGNOSIS — C569 Malignant neoplasm of unspecified ovary: Secondary | ICD-10-CM

## 2021-06-12 DIAGNOSIS — C563 Malignant neoplasm of bilateral ovaries: Secondary | ICD-10-CM | POA: Diagnosis not present

## 2021-06-12 DIAGNOSIS — Z95828 Presence of other vascular implants and grafts: Secondary | ICD-10-CM

## 2021-06-12 DIAGNOSIS — Z5189 Encounter for other specified aftercare: Secondary | ICD-10-CM | POA: Diagnosis not present

## 2021-06-12 DIAGNOSIS — E119 Type 2 diabetes mellitus without complications: Secondary | ICD-10-CM | POA: Diagnosis not present

## 2021-06-12 DIAGNOSIS — Z803 Family history of malignant neoplasm of breast: Secondary | ICD-10-CM | POA: Diagnosis not present

## 2021-06-12 DIAGNOSIS — I10 Essential (primary) hypertension: Secondary | ICD-10-CM | POA: Diagnosis not present

## 2021-06-12 DIAGNOSIS — Z5111 Encounter for antineoplastic chemotherapy: Secondary | ICD-10-CM | POA: Diagnosis not present

## 2021-06-12 DIAGNOSIS — R5383 Other fatigue: Secondary | ICD-10-CM | POA: Diagnosis not present

## 2021-06-12 DIAGNOSIS — Z808 Family history of malignant neoplasm of other organs or systems: Secondary | ICD-10-CM | POA: Diagnosis not present

## 2021-06-12 LAB — COMPREHENSIVE METABOLIC PANEL
ALT: 12 U/L (ref 0–44)
AST: 17 U/L (ref 15–41)
Albumin: 3.9 g/dL (ref 3.5–5.0)
Alkaline Phosphatase: 67 U/L (ref 38–126)
Anion gap: 4 — ABNORMAL LOW (ref 5–15)
BUN: 23 mg/dL (ref 8–23)
CO2: 22 mmol/L (ref 22–32)
Calcium: 9.1 mg/dL (ref 8.9–10.3)
Chloride: 111 mmol/L (ref 98–111)
Creatinine, Ser: 1.25 mg/dL — ABNORMAL HIGH (ref 0.44–1.00)
GFR, Estimated: 45 mL/min — ABNORMAL LOW (ref >60)
Glucose, Bld: 226 mg/dL — ABNORMAL HIGH (ref 70–99)
Potassium: 3.8 mmol/L (ref 3.5–5.1)
Sodium: 137 mmol/L (ref 135–145)
Total Bilirubin: 0.2 mg/dL — ABNORMAL LOW (ref 0.3–1.2)
Total Protein: 6.5 g/dL (ref 6.5–8.1)

## 2021-06-12 LAB — URINALYSIS, ROUTINE W REFLEX MICROSCOPIC
Bacteria, UA: NONE SEEN
Bilirubin Urine: NEGATIVE
Glucose, UA: 50 mg/dL — AB
Hgb urine dipstick: NEGATIVE
Ketones, ur: NEGATIVE mg/dL
Nitrite: NEGATIVE
Protein, ur: NEGATIVE mg/dL
Specific Gravity, Urine: 1.013 (ref 1.005–1.030)
pH: 5 (ref 5.0–8.0)

## 2021-06-12 LAB — CBC WITH DIFFERENTIAL/PLATELET
Abs Immature Granulocytes: 0.02 10*3/uL (ref 0.00–0.07)
Basophils Absolute: 0 10*3/uL (ref 0.0–0.1)
Basophils Relative: 1 %
Eosinophils Absolute: 0.1 10*3/uL (ref 0.0–0.5)
Eosinophils Relative: 1 %
HCT: 29.3 % — ABNORMAL LOW (ref 36.0–46.0)
Hemoglobin: 9.8 g/dL — ABNORMAL LOW (ref 12.0–15.0)
Immature Granulocytes: 0 %
Lymphocytes Relative: 17 %
Lymphs Abs: 1 10*3/uL (ref 0.7–4.0)
MCH: 30.5 pg (ref 26.0–34.0)
MCHC: 33.4 g/dL (ref 30.0–36.0)
MCV: 91.3 fL (ref 80.0–100.0)
Monocytes Absolute: 0.6 10*3/uL (ref 0.1–1.0)
Monocytes Relative: 9 %
Neutro Abs: 4.4 10*3/uL (ref 1.7–7.7)
Neutrophils Relative %: 72 %
Platelets: 175 10*3/uL (ref 150–400)
RBC: 3.21 MIL/uL — ABNORMAL LOW (ref 3.87–5.11)
RDW: 13.9 % (ref 11.5–15.5)
WBC: 6.1 10*3/uL (ref 4.0–10.5)
nRBC: 0 % (ref 0.0–0.2)

## 2021-06-12 LAB — MAGNESIUM: Magnesium: 1.8 mg/dL (ref 1.7–2.4)

## 2021-06-12 MED ORDER — SODIUM CHLORIDE 0.9% FLUSH
10.0000 mL | INTRAVENOUS | Status: DC | PRN
  Administered 2021-06-12 (×2): 10 mL

## 2021-06-12 MED ORDER — SODIUM CHLORIDE 0.9 % IV SOLN
140.0000 mg/m2 | Freq: Once | INTRAVENOUS | Status: AC
  Administered 2021-06-12: 282 mg via INTRAVENOUS
  Filled 2021-06-12: qty 47

## 2021-06-12 MED ORDER — DIPHENHYDRAMINE HCL 50 MG/ML IJ SOLN
50.0000 mg | Freq: Once | INTRAMUSCULAR | Status: AC
  Administered 2021-06-12: 50 mg via INTRAVENOUS
  Filled 2021-06-12: qty 1

## 2021-06-12 MED ORDER — SODIUM CHLORIDE 0.9 % IV SOLN
150.0000 mg | Freq: Once | INTRAVENOUS | Status: AC
  Administered 2021-06-12: 150 mg via INTRAVENOUS
  Filled 2021-06-12: qty 150

## 2021-06-12 MED ORDER — PEGFILGRASTIM 6 MG/0.6ML ~~LOC~~ PSKT
6.0000 mg | PREFILLED_SYRINGE | Freq: Once | SUBCUTANEOUS | Status: AC
  Administered 2021-06-12: 6 mg via SUBCUTANEOUS
  Filled 2021-06-12: qty 0.6

## 2021-06-12 MED ORDER — PALONOSETRON HCL INJECTION 0.25 MG/5ML
0.2500 mg | Freq: Once | INTRAVENOUS | Status: AC
  Administered 2021-06-12: 0.25 mg via INTRAVENOUS
  Filled 2021-06-12: qty 5

## 2021-06-12 MED ORDER — HEPARIN SOD (PORK) LOCK FLUSH 100 UNIT/ML IV SOLN
500.0000 [IU] | Freq: Once | INTRAVENOUS | Status: AC | PRN
  Administered 2021-06-12: 500 [IU]

## 2021-06-12 MED ORDER — SODIUM CHLORIDE 0.9 % IV SOLN
360.0000 mg | Freq: Once | INTRAVENOUS | Status: AC
  Administered 2021-06-12: 360 mg via INTRAVENOUS
  Filled 2021-06-12: qty 36

## 2021-06-12 MED ORDER — SODIUM CHLORIDE 0.9 % IV SOLN: Freq: Once | INTRAVENOUS | Status: AC

## 2021-06-12 MED ORDER — LORAZEPAM 2 MG/ML IJ SOLN
1.0000 mg | Freq: Once | INTRAMUSCULAR | Status: AC
  Administered 2021-06-12: 1 mg via INTRAVENOUS
  Filled 2021-06-12: qty 1

## 2021-06-12 MED ORDER — FAMOTIDINE IN NACL 20-0.9 MG/50ML-% IV SOLN
20.0000 mg | Freq: Once | INTRAVENOUS | Status: AC
  Administered 2021-06-12: 20 mg via INTRAVENOUS
  Filled 2021-06-12: qty 50

## 2021-06-12 MED ORDER — SODIUM CHLORIDE 0.9 % IV SOLN
10.0000 mg | Freq: Once | INTRAVENOUS | Status: AC
  Administered 2021-06-12: 10 mg via INTRAVENOUS
  Filled 2021-06-12: qty 10

## 2021-06-12 NOTE — Progress Notes (Signed)
Patient has been examined by Dr. Katragadda, and vital signs and labs have been reviewed. ANC, Creatinine, LFTs, hemoglobin, and platelets are within treatment parameters per M.D. - pt may proceed with treatment.    °

## 2021-06-12 NOTE — Progress Notes (Signed)
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 Carboplatin, paclitaxel and Aloxi x 6 cycles from 07/28/2019 to 11/17/2019. 2. Robotic assisted BSO with tumor debulking on 01/03/2020.  NGS Results: Foundation 1 MS--stable, TMB 0 Muts/Mb  CURRENT THERAPY: Carboplatin (AUC 6) / Paclitaxel (175) q21d x 6 cycles  BRIEF ONCOLOGIC HISTORY:  Oncology History  Malignant neoplasm of both ovaries  06/20/2019 Initial Diagnosis   Primary ovarian adenocarcinoma, unspecified laterality ([facility])   07/01/2019 Genetic Testing   Foundation One     07/11/2019 Genetic Testing   Negative genetic testing:  No pathogenic variants detected on the [facility] Multi-Cancer Panel. The report date is 07/11/2019.  The Multi-Cancer Panel offered by [facility] includes sequencing and/or deletion duplication testing of the following 85 genes: AIP, ALK, APC, ATM, AXIN2,BAP1,  BARD1, BLM, BMPR1A, BRCA1, BRCA2, BRIP1, CASR, CDC73, CDH1, CDK4, CDKN1B, CDKN1C, CDKN2A (p14ARF), CDKN2A (p16INK4a), CEBPA, CHEK2, CTNNA1, DICER1, DIS3L2, EGFR (c.2369C>T, p.Thr790Met variant only), EPCAM (Deletion/duplication testing only), FH, FLCN, GATA2, GPC3, GREM1 (Promoter region deletion/duplication testing only), HOXB13 (c.251G>A, p.Gly84Glu), HRAS, KIT, MAX, MEN1, MET, MITF (c.952G>A, p.Glu318Lys variant only), MLH1, MSH2, MSH3, MSH6, MUTYH, NBN, NF1, NF2, NTHL1, PALB2, PDGFRA, PHOX2B, PMS2, POLD1, POLE, POT1, PRKAR1A, PTCH1, PTEN, RAD50, RAD51C, RAD51D, RB1, RECQL4, RET, RNF43, RUNX1, SDHAF2, SDHA (sequence changes only), SDHB, SDHC, SDHD, SMAD4, SMARCA4, SMARCB1, SMARCE1, STK11, SUFU, TERC, TERT, TMEM127, TP53, TSC1, TSC2, VHL, WRN and WT1.   07/28/2019 -  Chemotherapy   Patient is on  Treatment Plan : OVARIAN Carboplatin (AUC 6) / Paclitaxel (175) q21d x 6 cycles        CANCER STAGING:  Cancer Staging  No matching staging information was found for the patient.  INTERVAL HISTORY:  [name], a 76 y.o. female, returns for routine follow-up and consideration for next cycle of chemotherapy. Cielo was last seen on 05/14/2021.  Due for cycle #8 of Carboplatin and Taxol today.   Overall, she tells me she has been feeling pretty well. She reports fatigue and SOB starting following last treatment and is still present. She reports intermittent right groin pain for the past 3 days which occurs in intervals lasting several hours and disrupts her sleep; she also reports urinary frequency. She also reports itching and pins-and-needles in her feet. Her appetite is good.   Overall, she feels ready for next cycle of chemo today.    REVIEW OF SYSTEMS:  Review of Systems  Constitutional:  Positive for fatigue. Negative for appetite change.  Respiratory:  Positive for shortness of breath.   Gastrointestinal:  Positive for abdominal pain (R groin), constipation and diarrhea.  Genitourinary:  Positive for frequency.   Musculoskeletal:  Positive for flank pain (3/10 R side).  Skin:  Positive for itching (feet).  Neurological:  Positive for dizziness and numbness (feet).  Psychiatric/Behavioral:  Positive for sleep disturbance.   All other systems reviewed and are negative.  PAST MEDICAL/SURGICAL HISTORY:  Past Medical History:  Diagnosis Date   Anemia    Anxiety    Arthritis    back of neck, bones spurs on neck   Cancer (HCC)    ovarian cancer   Cataract both eyes    Cervical disc disease    Diabetes mellitus Type 2  Family history of adverse reaction to anesthesia    sister slow to awaken   Family history of thyroid cancer    GERD (gastroesophageal reflux disease)    Heart murmur    History of COVID-19 06/2020   pain in side took paxlovid x 5 days all symptoms  resolved   History of radiation therapy    abdominal lymph nodes 01/21/2021-02/01/2021  Dr Gery Pray   Hydronephrosis 08/24/2020   Right Kidney (stable per CT)   Hyperlipidemia    Hypertension    IBS (irritable bowel syndrome)    Left shoulder frozen with limited rom    Mucoid cyst of joint    right thumb   Neuropathy 03/12/2021   hands/fingers both hands numb and tingle @ times   Port-A-Cath in place 07/21/2019   Reflux    Sleep apnea 03/12/2021   has not used cpap in 3 years   Vertigo 12/2010   none since treated at [facility]   Wears glasses for reading    Wears partial dentures upper    Past Surgical History:  Procedure Laterality Date   BLADDER SURGERY     [facility]   BREAST EXCISIONAL BIOPSY Bilateral    BREAST SURGERY     fibroid cyst removed ? over 10 yrs ago at [facility] per pt on 03-12-2021   CHOLECYSTECTOMY     yrs ago   COLONOSCOPY  07/02/2020   and 09-19-2016   CYSTOSCOPY W/ URETERAL STENT PLACEMENT Right 03/13/2021   Procedure: CYSTOSCOPY WITH RETROGRADE PYELOGRAM/URETERAL STENT PLACEMENT;  Surgeon: Robley Fries, MD;  Location: [facility];  Service: Urology;  Laterality: Right;  30 MINS   fibroids removed     breast (both breasts) age 6 and total of 3 surgeries   history of chemotherapy     6 rounds june 2021   IR IMAGING GUIDED PORT INSERTION  07/26/2019   right   MASS EXCISION Right 06/26/2016   Procedure: EXCISION MUCOID TUMOR RIGHT THUMB, IP RIGHT THUMB;  Surgeon: Daryll Brod, MD;  Location: [facility];  Service: Orthopedics;  Laterality: Right;   ROBOTIC ASSISTED BILATERAL SALPINGO OOPHERECTOMY N/A 01/03/2020   Procedure: XI ROBOTIC ASSISTED BILATERAL SALPINGO OOPHORECTOMY, RADICAL TUMOR DEBULKING;  Surgeon: Everitt Amber, MD;  Location: [facility];  Service: Gynecology;  Laterality: N/A;   ROBOTIC PELVIC AND PARA-AORTIC LYMPH NODE DISSECTION N/A 01/03/2020   Procedure: XI ROBOTIC PARA-AORTIC LYMPHADENECTOMY;  Surgeon: Everitt Amber,  MD;  Location: [facility];  Service: Gynecology;  Laterality: N/A;   VAGINAL HYSTERECTOMY     age late 30's    SOCIAL HISTORY:  Social History   Socioeconomic History   Marital status: Divorced    Spouse name: Not on file   Number of children: 4   Years of education: 16   Highest education level: Bachelor's degree (e.g., BA, AB, BS)  Occupational History   Occupation: retired Geographical information systems officer  Tobacco Use   Smoking status: Never   Smokeless tobacco: Never  Vaping Use   Vaping Use: Never used  Substance and Sexual Activity   Alcohol use: No    Alcohol/week: 0.0 standard drinks   Drug use: No   Sexual activity: Not Currently  Other Topics Concern   Not on file  Social History Narrative   Not on file   Social Determinants of Health   Financial Resource Strain: Low Risk    Difficulty of Paying Living Expenses: Not hard at all  Food Insecurity:  No Food Insecurity   Worried About Charity fundraiser in the Last Year: Never true   Ran Out of Food in the Last Year: Never true  Transportation Needs: No Transportation Needs   Lack of Transportation (Medical): No   Lack of Transportation (Non-Medical): No  Physical Activity: Insufficiently Active   Days of Exercise per Week: 2 days   Minutes of Exercise per Session: 60 min  Stress: No Stress Concern Present   Feeling of Stress : Not at all  Social Connections: Moderately Integrated   Frequency of Communication with Friends and Family: More than three times a week   Frequency of Social Gatherings with Friends and Family: Once a week   Attends Religious Services: More than 4 times per year   Active Member of [employer] or Organizations: Yes   Attends Music therapist: More than 4 times per year   Marital Status: Divorced  Human resources officer Violence: Not At Risk   Fear of Current or Ex-Partner: No   Emotionally Abused: No   Physically Abused: No   Sexually Abused: No    FAMILY HISTORY:  Family History   Problem Relation Age of Onset   Hypertension Mother    Diabetes Father    Hypertension Father    Diabetes Sister    Breast cancer Sister        stage 0   Hypertension Sister    Stroke Sister    Diabetes Maternal Aunt    Thyroid cancer Daughter 20   Hypertension Other    Colon cancer Neg Hx    Esophageal cancer Neg Hx    Stomach cancer Neg Hx    Rectal cancer Neg Hx    Endometrial cancer Neg Hx    Ovarian cancer Neg Hx     CURRENT MEDICATIONS:  Current Outpatient Medications  Medication Sig Dispense Refill   acetaminophen (TYLENOL) 500 MG tablet Take 1 tablet (500 mg total) by mouth every 6 (six) hours as needed. 30 tablet 0   amLODipine (NORVASC) 5 MG tablet TAKE 1 TABLET (5 MG TOTAL) BY MOUTH DAILY. (Patient taking differently: Take 5 mg by mouth daily.) 90 tablet 3   Blood Glucose Monitoring Suppl (ONE TOUCH ULTRA 2) w/Device KIT Use as directed 1 each 0   Cholecalciferol (VITAMIN D3) 50 MCG (2000 UT) TABS Take by mouth.     Eszopiclone 3 MG TABS Take 1 tablet (3 mg total) by mouth at bedtime. Take immediately before bedtime (Patient taking differently: Take 3 mg by mouth at bedtime. Take immediately before bedtime Per patient Prn) 90 tablet 1   Famotidine (PEPCID AC PO) Take by mouth as needed.     feeding supplement (BOOST HIGH PROTEIN) LIQD Take 237 mLs by mouth daily.     glimepiride (AMARYL) 2 MG tablet Take 1 tablet (2 mg total) by mouth daily before breakfast. (Patient taking differently: Take 1 mg by mouth daily before breakfast. 1/2 of 2 mg tab) 90 tablet 3   glucose blood (ONETOUCH ULTRA) test strip USE TO CHECK BLOOD SUGARS TWO TIMES DAILY 100 strip 3   Lancets (ONETOUCH ULTRASOFT) lancets 1 each by Other route as needed for other. Use as instructed 100 each 3   loperamide (IMODIUM) 2 MG capsule Take 2 mg by mouth as needed for diarrhea or loose stools.      loratadine (CLARITIN) 10 MG tablet Take 10 mg by mouth daily as needed (allergies.).     LORazepam (ATIVAN)  0.5 MG tablet Take  1 tablet (0.5 mg total) by mouth 2 (two) times daily as needed for anxiety. 60 tablet 0   solifenacin (VESICARE) 5 MG tablet TAKE 1 TABLET (5 MG TOTAL) BY MOUTH DAILY. 90 tablet 3   vitamin B-12 (CYANOCOBALAMIN) 1000 MCG tablet Take 1,000 mcg by mouth daily.      prochlorperazine (COMPAZINE) 10 MG tablet TAKE 1 TABLET (10 MG TOTAL) BY MOUTH EVERY 6 (SIX) HOURS AS NEEDED (NAUSEA OR VOMITING). (Patient not taking: Reported on 06/12/2021) 60 tablet 3   No current facility-administered medications for this visit.    ALLERGIES:  Allergies  Allergen Reactions   Ciprofloxacin Swelling    Torn tendon   Hydrocodone Bit-Homatrop Mbr Other (See Comments)    Vertigo *pt strongly prefers to never take* took 3 years to recover from   Sulfa Antibiotics Hives, Itching and Swelling    Tongue swells   Alfuzosin Other (See Comments)    Weakness and fatigue   Codeine Itching   Crestor [Rosuvastatin Calcium] Other (See Comments)    Did something to memory     Doxycycline Other (See Comments)    Severe rectal Gas.   Gabapentin     disoriented   Keflex [Cephalexin] Diarrhea and Nausea And Vomiting   Myrbetriq Andree [place] Er] Swelling    Swelling of legs and feet   Naproxen Other (See Comments)    Stomach cramps/loud gas   Statins     Muscle weakness   Prednisone Anxiety    *pt strongly prefers to never be given prednisone*     PHYSICAL EXAM:  Performance status (ECOG): 1 - Symptomatic but completely ambulatory  Vitals:   06/12/21 0819  BP: (!) 156/68  Pulse: 87  Resp: 18  Temp: 98.2 F (36.8 C)  SpO2: 100%   Wt Readings from Last 3 Encounters:  06/12/21 191 lb 3.2 oz (86.7 kg)  06/06/21 190 lb 4.1 oz (86.3 kg)  05/26/21 190 lb (86.2 kg)   Physical Exam Vitals reviewed.  Constitutional:      Appearance: Normal appearance. She is obese.  Cardiovascular:     Rate and Rhythm: Normal rate and regular rhythm.     Pulses: Normal pulses.     Heart sounds: Normal  heart sounds.  Pulmonary:     Effort: Pulmonary effort is normal.     Breath sounds: Normal breath sounds.  Musculoskeletal:     Thoracic back: No tenderness.     Lumbar back: No tenderness.     Right lower leg: No edema.     Left lower leg: No edema.  Neurological:     General: No focal deficit present.     Mental Status: She is alert and oriented to person, place, and time.  Psychiatric:        Mood and Affect: Mood normal.        Behavior: Behavior normal.    LABORATORY DATA:  I have reviewed the labs as listed.     Latest Ref Rng & Units 06/12/2021    8:11 AM 06/06/2021    1:19 PM 05/22/2021    8:11 AM  CBC  WBC 4.0 - 10.5 K/uL 6.1   10.3   5.5    Hemoglobin 12.0 - 15.0 g/dL 9.8   10.3   10.7    Hematocrit 36.0 - 46.0 % 29.3   30.7   32.7    Platelets 150 - 400 K/uL 175   200   203        Latest Ref  Rng & Units 06/12/2021    8:11 AM 06/06/2021    1:19 PM 05/22/2021    8:11 AM  CMP  Glucose 70 - 99 mg/dL 226   160   204    BUN 8 - 23 mg/dL 23   23   27     Creatinine 0.44 - 1.00 mg/dL 1.25   1.08   1.43    Sodium 135 - 145 mmol/L 137   137   140    Potassium 3.5 - 5.1 mmol/L 3.8   3.9   3.9    Chloride 98 - 111 mmol/L 111   111   111    CO2 22 - 32 mmol/L 22   23   21     Calcium 8.9 - 10.3 mg/dL 9.1   9.3   9.9    Total Protein 6.5 - 8.1 g/dL 6.5   7.0   7.2    Total Bilirubin 0.3 - 1.2 mg/dL 0.2   0.2   0.3    Alkaline Phos 38 - 126 U/L 67   85   66    AST 15 - 41 U/L 17   17   18     ALT 0 - 44 U/L 12   16   13       DIAGNOSTIC IMAGING:  I have independently reviewed the scans and discussed with the patient. DG Chest 2 View  Result Date: 05/26/2021 CLINICAL DATA:  Short of breath. EXAM: CHEST - 2 VIEW COMPARISON:  07/07/2020.  CT, 04/04/2019. FINDINGS: Cardiac silhouette normal in size. No mediastinal or hilar masses. No evidence of adenopathy. Right anterior chest wall, internal jugular, Port-A-Cath is stable. Clear lungs.  No pleural effusion or pneumothorax.  Skeletal structures are intact. IMPRESSION: No active cardiopulmonary disease. Electronically Signed   By: Lajean Manes M.D.   On: 05/26/2021 14:58     ASSESSMENT:  1.  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  Mass measures 6 x 5.8 cm and is partially calcified.  Separate solid component superior to this in the left periaortic region measures 3.5 cm, SUV 14.3.  Cystic component medial to the lower pole of the left kidney is without hypermetabolic activity, possibly a lymphocele. -CT-guided biopsy of the right retroperitoneal lymph node consistent with adenocarcinoma with psammoma bodies.  Morphology and immunotherapy consistent with ovarian serous carcinoma/primary peritoneal carcinoma. -Germline mutation testing was negative. -Foundation 1 testing shows MS-stable.  Loss of heterozygosity was less than 16%. -CA-125 346 on 06/23/2019. - 6 cycles of carboplatin and paclitaxel from 07/28/2019 through 11/17/2019 -CTAP on 10/04/2019 after 3 cycles of chemotherapy showed retroperitoneal adenopathy at the bifurcation measuring 5.2 x 2.9 cm, left para-aortic lymphadenopathy measuring 5 x 4.4 cm, both of them decreased in size when compared to most recent PET scan.  CT scan report says that one of the lesion has gotten bigger but this was compared to CT scan from 04/02/2019. -PET scan on December 05, 2019 shows 4.8 x 4.2 cm fluid filled density in the left para-aortic region, non-FDG avid favoring benign retroperitoneal cyst versus lymphangioma.  Right/anterior para-aortic mixed cystic/solid lesions measuring 2.4 x 4.8 cm, SUV 2.5, previous SUV 19.5. -Robotic assisted laparoscopic total hysterectomy and bilateral salpingo-oophorectomy by Dr. Denman George on 01/03/2020. -Pathology showed soft tissue deformities 2 sites and psammomatous calcifications and chronic inflammation with no malignancy  identified in the para-aortic lymph node.  Right salpingo-oophorectomy showed  microscopic focus of residual adenocarcinoma less than 1 mm.  No malignancy in the left ovary.  [id number]. -As she had prior difficulty with chemotherapy, no further chemotherapy after surgery was recommended. - XRT to retroperitoneal nodal mass (IMRT) from 01/21/2021 through 02/01/2021.  10 fractions, 40 Gray. - Right ureteral stent placement on 03/13/2021 due to right hydronephrosis from retroperitoneal nodal mass. - CTAP (04/01/2021): Centrally necrotic lymph node has increased in size. - We discussed CT findings and reviewed images.  Last tumor marker was normal. - She complained of right lower quadrant pain radiating to the back which started on 05/11/2021 and ended on 05/13/2021 night. - Cycle 1 of dose reduced carboplatin and paclitaxel on 05/22/2021  PLAN:  1.  Recurrent ovarian serous carcinoma: - She has tolerated first cycle reasonably well.  She had tiredness throughout the 3 weeks. - She reported intermittent right groin pain for the last 2 to 3 days lasting few hours with improvement with Tylenol.  She also reported increased urination. - Reviewed UA today which showed trace leukocytes with negative bacteria and nitrite. - We have sent cultures on the urine. #Reviewed labs today which shows normal white count and platelet count.  Hemoglobin is 9.8 from myelosuppression.  LFTs are normal.  Creatinine is 1.25 and stable. - She will proceed with cycle 2 today.  RTC 3 weeks for follow-up with repeat labs and tumor marker.  Plan to repeat scan after cycle 3.  She will see Dr. Berline Lopes after cycle 3.     2.  Hypertension: -Continue amlodipine and irbesartan.   Orders placed this encounter:  No orders of the defined types were placed in this encounter.    Derek Jack, MD [facility] [phone]   I, Thana Ates, am acting as a scribe for Dr. Derek Jack.  I, Derek Jack  MD, have reviewed the above documentation for accuracy and completeness, and I agree with the above.

## 2021-06-12 NOTE — Patient Instructions (Signed)
[facility]  Discharge Instructions: Thank you for choosing [facility] to provide your oncology and hematology care.  If you have a lab appointment with the Cancer Center, please come in thru the Main Entrance and check in at the main information desk.  Wear comfortable clothing and clothing appropriate for easy access to any Portacath or PICC line.   We strive to give you quality time with your provider. You may need to reschedule your appointment if you arrive late (15 or more minutes).  Arriving late affects you and other patients whose appointments are after yours.  Also, if you miss three or more appointments without notifying the office, you may be dismissed from the clinic at the provider's discretion.      For prescription refill requests, have your pharmacy contact our office and allow 72 hours for refills to be completed.    Today you received the following chemotherapy and/or immunotherapy agents Taxol and Carboplatin, return as scheduled.   To help prevent nausea and vomiting after your treatment, we encourage you to take your nausea medication as directed.  BELOW ARE SYMPTOMS THAT SHOULD BE REPORTED IMMEDIATELY: *FEVER GREATER THAN 100.4 F (38 C) OR HIGHER *CHILLS OR SWEATING *NAUSEA AND VOMITING THAT IS NOT CONTROLLED WITH YOUR NAUSEA MEDICATION *UNUSUAL SHORTNESS OF BREATH *UNUSUAL BRUISING OR BLEEDING *URINARY PROBLEMS (pain or burning when urinating, or frequent urination) *BOWEL PROBLEMS (unusual diarrhea, constipation, pain near the anus) TENDERNESS IN MOUTH AND THROAT WITH OR WITHOUT PRESENCE OF ULCERS (sore throat, sores in mouth, or a toothache) UNUSUAL RASH, SWELLING OR PAIN  UNUSUAL VAGINAL DISCHARGE OR ITCHING   Items with * indicate a potential emergency and should be followed up as soon as possible or go to the Emergency Department if any problems should occur.  Please show the CHEMOTHERAPY ALERT CARD or IMMUNOTHERAPY ALERT CARD at  check-in to the Emergency Department and triage nurse.  Should you have questions after your visit or need to cancel or reschedule your appointment, please contact [facility] [phone]  and follow the prompts.  Office hours are 8:00 a.m. to 4:30 p.m. Monday - Friday. Please note that voicemails left after 4:00 p.m. may not be returned until the following business day.  We are closed weekends and major holidays. You have access to a nurse at all times for urgent questions. Please call the main number to the clinic [phone] and follow the prompts.  For any non-urgent questions, you may also contact your provider using MyChart. We now offer e-Visits for anyone 18 and older to request care online for non-urgent symptoms. For details visit [email or web address].   Also download the MyChart app! Go to the app store, search "MyChart", open the app, select [facility], and log in with your MyChart username and password.  Due to Covid, a mask is required upon entering the hospital/clinic. If you do not have a mask, one will be given to you upon arrival. For doctor visits, patients may have 1 support person aged 18 or older with them. For treatment visits, patients cannot have anyone with them due to current Covid guidelines and our immunocompromised population.  

## 2021-06-12 NOTE — Patient Instructions (Signed)
[facility] at [facility] Discharge Instructions   You were seen and examined today by Dr. Katragadda.  He reviewed the results of your lab work which is normal/stable.   We will proceed with your treatment today.  Return as scheduled.    Thank you for choosing [facility] at [facility] to provide your oncology and hematology care.  To afford each patient quality time with our provider, please arrive at least 15 minutes before your scheduled appointment time.   If you have a lab appointment with the Cancer Center please come in thru the Main Entrance and check in at the main information desk.  You need to re-schedule your appointment should you arrive 10 or more minutes late.  We strive to give you quality time with our providers, and arriving late affects you and other patients whose appointments are after yours.  Also, if you no show three or more times for appointments you may be dismissed from the clinic at the providers discretion.     Again, thank you for choosing [facility].  Our hope is that these requests will decrease the amount of time that you wait before being seen by our physicians.       _____________________________________________________________  Should you have questions after your visit to [facility], please contact our office at [phone] and follow the prompts.  Our office hours are 8:00 a.m. and 4:30 p.m. Monday - Friday.  Please note that voicemails left after 4:00 p.m. may not be returned until the following business day.  We are closed weekends and major holidays.  You do have access to a nurse 24-7, just call the main number to the clinic [phone] and do not press any options, hold on the line and a nurse will answer the phone.    For prescription refill requests, have your pharmacy contact our office and allow 72 hours.    Due to Covid, you will need to wear a mask upon entering the  hospital. If you do not have a mask, a mask will be given to you at the Main Entrance upon arrival. For doctor visits, patients may have 1 support person age 18 or older with them. For treatment visits, patients can not have anyone with them due to social distancing guidelines and our immunocompromised population.      

## 2021-06-12 NOTE — Progress Notes (Signed)
Patient presents today for treatment, patient and labs assessed by Dr. Delton Coombes, patient okay for treatment. Patient reports being restless after administration of Benadryl, Dr. Delton Coombes notified and RN received orders for '1mg'$  of Ativan.  Onpro applied to right arm with no alarms noted. Patient tolerated chemotherapy with no complaints voiced. Side effects with management reviewed understanding verbalized. Port site clean and dry with no bruising or swelling noted at site. Good blood return noted before and after administration of chemotherapy. Band aid applied. Patient left in satisfactory condition with VSS and no s/s of distress noted.

## 2021-06-13 LAB — URINE CULTURE: Culture: NO GROWTH

## 2021-06-14 ENCOUNTER — Encounter (HOSPITAL_COMMUNITY): Payer: Self-pay | Admitting: Hematology

## 2021-06-17 ENCOUNTER — Ambulatory Visit (INDEPENDENT_AMBULATORY_CARE_PROVIDER_SITE_OTHER): Payer: Medicare Other | Admitting: *Deleted

## 2021-06-17 DIAGNOSIS — Z0000 Encounter for general adult medical examination without abnormal findings: Secondary | ICD-10-CM | POA: Diagnosis not present

## 2021-06-17 NOTE — Patient Instructions (Signed)
[name] , Thank you for taking time to come for your Medicare Wellness Visit. I appreciate your ongoing commitment to your health goals. Please review the following plan we discussed and let me know if I can assist you in the future.   Screening recommendations/referrals: Colonoscopy: up to date Mammogram: Education provided Bone Density: up to date Recommended yearly ophthalmology/optometry visit for glaucoma screening and checkup Recommended yearly dental visit for hygiene and checkup  Vaccinations: Influenza vaccine: up to date Pneumococcal vaccine: up to date Tdap vaccine: up to date Shingles vaccine: not required    Advanced directives: Education provided  Conditions/risks identified:   Next appointment: 09-05-2021 @ 1:00  Laura [name] 65 Years and Older, Female Preventive care refers to lifestyle choices and visits with your health care provider that can promote health and wellness. What does preventive care include? A yearly physical exam. This is also called an annual well check. Dental exams once or twice a year. Routine eye exams. Ask your health care provider how often you should have your eyes checked. Personal lifestyle choices, including: Daily care of your teeth and gums. Regular physical activity. Eating a healthy diet. Avoiding tobacco and drug use. Limiting alcohol use. Practicing safe sex. Taking low-dose aspirin every day. Taking vitamin and mineral supplements as recommended by your health care provider. What happens during an annual well check? The services and screenings done by your health care provider during your annual well check will depend on your age, overall health, lifestyle risk factors, and family history of disease. Counseling  Your health care provider may ask you questions about your: Alcohol use. Tobacco use. Drug use. Emotional well-being. Home and relationship well-being. Sexual activity. Eating habits. History of  falls. Memory and ability to understand (cognition). Work and work Statistician. Reproductive health. Screening  You may have the following tests or measurements: Height, weight, and BMI. Blood pressure. Lipid and cholesterol levels. These may be checked every 5 years, or more frequently if you are over 80 years old. Skin check. Lung cancer screening. You may have this screening every year starting at age 44 if you have a 30-pack-year history of smoking and currently smoke or have quit within the past 15 years. Fecal occult blood test (FOBT) of the stool. You may have this test every year starting at age 59. Flexible sigmoidoscopy or colonoscopy. You may have a sigmoidoscopy every 5 years or a colonoscopy every 10 years starting at age 22. Hepatitis C blood test. Hepatitis B blood test. Sexually transmitted disease (STD) testing. Diabetes screening. This is done by checking your blood sugar (glucose) after you have not eaten for a while (fasting). You may have this done every 1-3 years. Bone density scan. This is done to screen for osteoporosis. You may have this done starting at age 17. Mammogram. This may be done every 1-2 years. Talk to your health care provider about how often you should have regular mammograms. Talk with your health care provider about your test results, treatment options, and if necessary, the need for more tests. Vaccines  Your health care provider may recommend certain vaccines, such as: Influenza vaccine. This is recommended every year. Tetanus, diphtheria, and acellular pertussis (Tdap, Td) vaccine. You may need a Td booster every 10 years. Zoster vaccine. You may need this after age 54. Pneumococcal 13-valent conjugate (PCV13) vaccine. One dose is recommended after age 26. Pneumococcal polysaccharide (PPSV23) vaccine. One dose is recommended after age 37. Talk to your health care provider  about which screenings and vaccines you need and how often you need  them. This information is not intended to replace advice given to you by your health care provider. Make sure you discuss any questions you have with your health care provider. Document Released: 01/26/2015 Document Revised: 09/19/2015 Document Reviewed: 10/31/2014 Elsevier Interactive Patient Education  2017 [place] Prevention in the Home Falls can cause injuries. They can happen to people of all ages. There are many things you can do to make your home safe and to help prevent falls. What can I do on the outside of my home? Regularly fix the edges of walkways and driveways and fix any cracks. Remove anything that might make you trip as you walk through a door, such as a raised step or threshold. Trim any bushes or trees on the path to your home. Use bright outdoor lighting. Clear any walking paths of anything that might make someone trip, such as rocks or tools. Regularly check to see if handrails are loose or broken. Make sure that both sides of any steps have handrails. Any raised decks and porches should have guardrails on the edges. Have any leaves, snow, or ice cleared regularly. Use sand or salt on walking paths during winter. Clean up any spills in your garage right away. This includes oil or grease spills. What can I do in the bathroom? Use night lights. Install grab bars by the toilet and in the tub and shower. Do not use towel bars as grab bars. Use non-skid mats or decals in the tub or shower. If you need to sit down in the shower, use a plastic, non-slip stool. Keep the floor dry. Clean up any water that spills on the floor as soon as it happens. Remove soap buildup in the tub or shower regularly. Attach bath mats securely with double-sided non-slip rug tape. Do not have throw rugs and other things on the floor that can make you trip. What can I do in the bedroom? Use night lights. Make sure that you have a light by your bed that is easy to reach. Do not use  any sheets or blankets that are too big for your bed. They should not hang down onto the floor. Have a firm chair that has side arms. You can use this for support while you get dressed. Do not have throw rugs and other things on the floor that can make you trip. What can I do in the kitchen? Clean up any spills right away. Avoid walking on wet floors. Keep items that you use a lot in easy-to-reach places. If you need to reach something above you, use a strong step stool that has a grab bar. Keep electrical cords out of the way. Do not use floor polish or wax that makes floors slippery. If you must use wax, use non-skid floor wax. Do not have throw rugs and other things on the floor that can make you trip. What can I do with my stairs? Do not leave any items on the stairs. Make sure that there are handrails on both sides of the stairs and use them. Fix handrails that are broken or loose. Make sure that handrails are as long as the stairways. Check any carpeting to make sure that it is firmly attached to the stairs. Fix any carpet that is loose or worn. Avoid having throw rugs at the top or bottom of the stairs. If you do have throw rugs, attach them to the floor  with carpet tape. Make sure that you have a light switch at the top of the stairs and the bottom of the stairs. If you do not have them, ask someone to add them for you. What else can I do to help prevent falls? Wear shoes that: Do not have high heels. Have rubber bottoms. Are comfortable and fit you well. Are closed at the toe. Do not wear sandals. If you use a stepladder: Make sure that it is fully opened. Do not climb a closed stepladder. Make sure that both sides of the stepladder are locked into place. Ask someone to hold it for you, if possible. Clearly mark and make sure that you can see: Any grab bars or handrails. First and last steps. Where the edge of each step is. Use tools that help you move around (mobility aids)  if they are needed. These include: Canes. Walkers. Scooters. Crutches. Turn on the lights when you go into a dark area. Replace any light bulbs as soon as they burn out. Set up your furniture so you have a clear path. Avoid moving your furniture around. If any of your floors are uneven, fix them. If there are any pets around you, be aware of where they are. Review your medicines with your doctor. Some medicines can make you feel dizzy. This can increase your chance of falling. Ask your doctor what other things that you can do to help prevent falls. This information is not intended to replace advice given to you by your health care provider. Make sure you discuss any questions you have with your health care provider. Document Released: 10/26/2008 Document Revised: 06/07/2015 Document Reviewed: 02/03/2014 Elsevier Interactive Patient Education  2017 [employer].

## 2021-06-17 NOTE — Progress Notes (Signed)
Subjective:   Laura [name] is a 76 y.o. female who presents for Medicare Annual (Subsequent) preventive examination.  I connected with  Laura [name] on 06/17/21 by a video enabled telemedicine application and verified that I am speaking with the correct person using two identifiers.   I discussed the limitations of evaluation and management by telemedicine. The patient expressed understanding and agreed to proceed.  Patient location: home  Provider location: tele-health-home   Review of Systems          Objective:    There were no vitals filed for this visit. There is no height or weight on file to calculate BMI.     06/12/2021    8:41 AM 06/06/2021    3:04 PM 05/31/2021   12:29 PM 05/22/2021   10:12 AM 05/14/2021    2:04 PM 04/16/2021    2:50 PM 03/13/2021   10:46 AM  Advanced Directives  Does Patient Have a Medical Advance Directive? Yes No No No No No;Yes No  Type of Paramedic of [place];Living will  [place];Living will [place];Living will  [place];Living will   Does patient want to make changes to medical advance directive? No - Patient declined  No - Patient declined  No - Patient declined  No - Patient declined  Copy of [place] in Chart? No - copy requested   No - copy requested  No - copy requested   Would patient like information on creating a medical advance directive? No - Patient declined No - Patient declined No - Patient declined No - Patient declined No - Patient declined  No - Patient declined    Current Medications (verified) Outpatient Encounter Medications as of 06/17/2021  Medication Sig   acetaminophen (TYLENOL) 500 MG tablet Take 1 tablet (500 mg total) by mouth every 6 (six) hours as needed.   amLODipine (NORVASC) 5 MG tablet TAKE 1 TABLET (5 MG TOTAL) BY MOUTH DAILY. (Patient taking differently: Take 5 mg by mouth daily.)   Blood Glucose Monitoring  Suppl (ONE TOUCH ULTRA 2) w/Device KIT Use as directed   Cholecalciferol (VITAMIN D3) 50 MCG (2000 UT) TABS Take by mouth.   Eszopiclone 3 MG TABS Take 1 tablet (3 mg total) by mouth at bedtime. Take immediately before bedtime (Patient taking differently: Take 3 mg by mouth at bedtime. Take immediately before bedtime Per patient Prn)   Famotidine (PEPCID AC PO) Take by mouth as needed.   feeding supplement (BOOST HIGH PROTEIN) LIQD Take 237 mLs by mouth daily.   glimepiride (AMARYL) 2 MG tablet Take 1 tablet (2 mg total) by mouth daily before breakfast. (Patient taking differently: Take 1 mg by mouth daily before breakfast. 1/2 of 2 mg tab)   glucose blood (ONETOUCH ULTRA) test strip USE TO CHECK BLOOD SUGARS TWO TIMES DAILY   Lancets (ONETOUCH ULTRASOFT) lancets 1 each by Other route as needed for other. Use as instructed   loperamide (IMODIUM) 2 MG capsule Take 2 mg by mouth as needed for diarrhea or loose stools.    loratadine (CLARITIN) 10 MG tablet Take 10 mg by mouth daily as needed (allergies.).   LORazepam (ATIVAN) 0.5 MG tablet Take 1 tablet (0.5 mg total) by mouth 2 (two) times daily as needed for anxiety.   prochlorperazine (COMPAZINE) 10 MG tablet TAKE 1 TABLET (10 MG TOTAL) BY MOUTH EVERY 6 (SIX) HOURS AS NEEDED (NAUSEA OR VOMITING). (Patient not taking:  Reported on 06/12/2021)   solifenacin (VESICARE) 5 MG tablet TAKE 1 TABLET (5 MG TOTAL) BY MOUTH DAILY.   vitamin B-12 (CYANOCOBALAMIN) 1000 MCG tablet Take 1,000 mcg by mouth daily.    No facility-administered encounter medications on file as of 06/17/2021.    Allergies (verified) Ciprofloxacin, Hydrocodone bit-homatrop mbr, Sulfa antibiotics, Alfuzosin, Codeine, Crestor [rosuvastatin calcium], Doxycycline, Gabapentin, Keflex [cephalexin], Myrbetriq [mirabegron er], Naproxen, Statins, and Prednisone   History: Past Medical History:  Diagnosis Date   Anemia    Anxiety    Arthritis    back of neck, bones spurs on neck   Cancer  (HCC)    ovarian cancer   Cataract both eyes    Cervical disc disease    Diabetes mellitus Type 2    Family history of adverse reaction to anesthesia    sister slow to awaken   Family history of thyroid cancer    GERD (gastroesophageal reflux disease)    Heart murmur    History of COVID-19 06/2020   pain in side took paxlovid x 5 days all symptoms resolved   History of radiation therapy    abdominal lymph nodes 01/21/2021-02/01/2021  Dr Gery Pray   Hydronephrosis 08/24/2020   Right Kidney (stable per CT)   Hyperlipidemia    Hypertension    IBS (irritable bowel syndrome)    Left shoulder frozen with limited rom    Mucoid cyst of joint    right thumb   Neuropathy 03/12/2021   hands/fingers both hands numb and tingle @ times   Port-A-Cath in place 07/21/2019   Reflux    Sleep apnea 03/12/2021   has not used cpap in 3 years   Vertigo 12/2010   none since treated at [facility]   Wears glasses for reading    Wears partial dentures upper    Past Surgical History:  Procedure Laterality Date   BLADDER SURGERY     [facility]   BREAST EXCISIONAL BIOPSY Bilateral    BREAST SURGERY     fibroid cyst removed ? over 10 yrs [facility] on 03-12-2021   CHOLECYSTECTOMY     yrs ago   COLONOSCOPY  07/02/2020   and 09-19-2016   CYSTOSCOPY W/ URETERAL STENT PLACEMENT Right 03/13/2021   Procedure: CYSTOSCOPY WITH RETROGRADE PYELOGRAM/URETERAL STENT PLACEMENT;  Surgeon: Robley Fries, MD;  Location: [facility];  Service: Urology;  Laterality: Right;  30 MINS   fibroids removed     breast (both breasts) age 57 and total of 3 surgeries   history of chemotherapy     6 rounds june 2021   IR IMAGING GUIDED PORT INSERTION  07/26/2019   right   MASS EXCISION Right 06/26/2016   Procedure: EXCISION MUCOID TUMOR RIGHT THUMB, IP RIGHT THUMB;  Surgeon: Daryll Brod, MD;  Location: [facility];  Service: Orthopedics;  Laterality: Right;   ROBOTIC ASSISTED BILATERAL  SALPINGO OOPHERECTOMY N/A 01/03/2020   Procedure: XI ROBOTIC ASSISTED BILATERAL SALPINGO OOPHORECTOMY, RADICAL TUMOR DEBULKING;  Surgeon: Everitt Amber, MD;  Location: [facility];  Service: Gynecology;  Laterality: N/A;   ROBOTIC PELVIC AND PARA-AORTIC LYMPH NODE DISSECTION N/A 01/03/2020   Procedure: XI ROBOTIC PARA-AORTIC LYMPHADENECTOMY;  Surgeon: Everitt Amber, MD;  Location: [facility];  Service: Gynecology;  Laterality: N/A;   VAGINAL HYSTERECTOMY     age late 43's   Family History  Problem Relation Age of Onset   Hypertension Mother    Diabetes Father  Hypertension Father    Diabetes Sister    Breast cancer Sister        stage 0   Hypertension Sister    Stroke Sister    Diabetes Maternal Aunt    Thyroid cancer Daughter 50   Hypertension Other    Colon cancer Neg Hx    Esophageal cancer Neg Hx    Stomach cancer Neg Hx    Rectal cancer Neg Hx    Endometrial cancer Neg Hx    Ovarian cancer Neg Hx    Social History   Socioeconomic History   Marital status: Divorced    Spouse name: Not on file   Number of children: 4   Years of education: 16   Highest education level: Bachelor's degree (e.g., BA, AB, BS)  Occupational History   Occupation: retired Geographical information systems officer  Tobacco Use   Smoking status: Never   Smokeless tobacco: Never  Vaping Use   Vaping Use: Never used  Substance and Sexual Activity   Alcohol use: No    Alcohol/week: 0.0 standard drinks   Drug use: No   Sexual activity: Not Currently  Other Topics Concern   Not on file  Social History Narrative   Not on file   Social Determinants of Health   Financial Resource Strain: Not on file  Food Insecurity: Not on file  Transportation Needs: Not on file  Physical Activity: Not on file  Stress: Not on file  Social Connections: Unknown   Frequency of Communication with Friends and Family: Not on file   Frequency of Social Gatherings with Friends and Family: Not on file   Attends Religious Services: More  than 4 times per year   Active Member of [employer] or Organizations: Yes   Attends Archivist Meetings: More than 4 times per year   Marital Status: Not on file    Tobacco Counseling Counseling given: Not Answered   Clinical Intake:              How often do you need to have someone help you when you read instructions, pamphlets, or other written materials from your doctor or pharmacy?: (P) 1 - Never  Diabetic?  Yes  Nutrition Risk Assessment:  Has the patient had any N/V/D within the last 2 months?  No  Does the patient have any non-healing wounds?  No  Has the patient had any unintentional weight loss or weight gain?  No   Diabetes:  Is the patient diabetic?  Yes  If diabetic, was a CBG obtained today?  No  Did the patient bring in their glucometer from home?  No  How often do you monitor your CBG's? 1 x day .   Financial Strains and Diabetes Management:  Are you having any financial strains with the device, your supplies or your medication? No .  Does the patient want to be seen by Chronic Care Management for management of their diabetes?  No  Would the patient like to be referred to a Nutritionist or for Diabetic Management?  No   Diabetic Exams:  Diabetic Eye Exam: Overdue for diabetic eye exam. Pt has been advised about the importance in completing this exam.   Diabetic Foot Exam: Pt has been advised about the importance in completing this exam.          Activities of Daily Living    06/16/2021   12:32 PM 06/10/2021   10:29 AM  In your present state of health, do you have  any difficulty performing the following activities:  Hearing? 1 1   1   Vision? 0 0   0  Difficulty concentrating or making decisions? 0 0   0  Walking or climbing stairs? 1 1   1   Dressing or bathing? 0 0   0  Doing errands, shopping? 1 1   1   Preparing Food and eating ? Y Y   Y  Using the Toilet? N N   N  In the past six months, have you accidently leaked urine? Y  Y   Y  Do you have problems with loss of bowel control? Judyann Munson  Managing your Medications? N N   N  Managing your Finances? N N   N  Housekeeping or managing your Housekeeping? Judyann Munson    Patient Care Team: Biagio Borg, MD as PCP - General (Internal Medicine) Milus Banister, MD as Attending Physician (Gastroenterology) Melida Quitter, MD as Attending Physician (Otolaryngology) [place], Peter M, MD (Cardiology) Jessy Oto, MD as Consulting Physician (Orthopedic Surgery) Derek Jack, MD as Medical Oncologist (Oncology) Donetta Potts, RN as Oncology Nurse Navigator (Oncology)  Indicate any recent Medical Services you may have received from other than [facility] providers in the past year (date may be approximate).     Assessment:   This is a routine wellness examination for [facility]  Hearing/Vision screen No results found.  Dietary issues and exercise activities discussed:     Goals Addressed   None    Depression Screen    03/06/2021    1:21 PM 03/06/2021    1:05 PM 09/03/2020    2:29 PM 06/12/2020    2:33 PM 03/06/2020    3:40 PM 02/28/2020    2:39 PM 10/11/2019   11:21 AM  PHQ 2/9 Scores  PHQ - 2 Score 0 0  0 0 0 1  PHQ- 9 Score  0   0    Exception Documentation   Patient refusal        Fall Risk    06/16/2021   12:32 PM 06/10/2021   10:29 AM 03/06/2021    1:21 PM 06/12/2020    2:33 PM 02/28/2020    2:39 PM  Fall Risk   Falls in the past year? 1 1   1  0 0 0  Number falls in past yr: 0 0   0 0 0   Injury with Fall? 1 1   1  0 0   Risk for fall due to :    No Fall Risks   Follow up    Falls evaluation completed     [place]:  Any stairs in or around the home? Yes  If so, are there any without handrails? No  Home free of loose throw rugs in walkways, pet beds, electrical cords, etc? Yes  Adequate lighting in your home to reduce risk of falls? Yes   ASSISTIVE DEVICES UTILIZED TO PREVENT FALLS:  Life alert? No   Use of a cane, walker or w/c? No  Grab bars in the bathroom? No  Shower chair or bench in shower? No  Elevated toilet seat or a handicapped toilet? No   TIMED UP AND GO:  Was the test performed? No .    Cognitive Function:        Immunizations Immunization History  Administered Date(s) Administered   Fluad Quad(high Dose 65+) 11/22/2018, 10/11/2019, 11/30/2020   Influenza Split 01/06/2011, 10/22/2011  Influenza, High Dose Seasonal PF 12/07/2012, 11/17/2013, 11/17/2013, 12/12/2015, 12/01/2016, 11/19/2017   Pneumococcal Conjugate-13 02/15/2013   Pneumococcal Polysaccharide-23 01/19/2006, 01/17/2008, 02/23/2017   Td 01/15/2004   Tdap 06/29/2015    TDAP status: Up to date  Flu Vaccine status: Up to date  Pneumococcal vaccine status: Up to date  Covid-19 vaccine status: Information provided on how to obtain vaccines.   Shingles does not qualify Spoken to MD  Screening Tests Health Maintenance  Topic Date Due   COVID-19 Vaccine (1) Never done   Zoster Vaccines- Shingrix (1 of 2) Never done   OPHTHALMOLOGY EXAM  10/23/2020   INFLUENZA VACCINE  08/13/2021   HEMOGLOBIN A1C  09/03/2021   FOOT EXAM  03/06/2022   URINE MICROALBUMIN  03/06/2022   TETANUS/TDAP  06/28/2025   Pneumonia Vaccine 81+ Years old  Completed   DEXA SCAN  Completed   Hepatitis C Screening  Completed   HPV VACCINES  Aged Out   COLONOSCOPY (Pts 45-58yr Insurance coverage will need to be confirmed)  Discontinued    Health Maintenance  Health Maintenance Due  Topic Date Due   COVID-19 Vaccine (1) Never done   Zoster Vaccines- Shingrix (1 of 2) Never done   OPHTHALMOLOGY EXAM  10/23/2020    Colorectal cancer screening: No longer required.   Mammogram  will discuss with MD  Bone Density status: Completed  . Results reflect: Bone density results: NORMAL. Repeat every   years.  Lung Cancer Screening: (Low Dose CT Chest recommended if Age 77-80years, 30 pack-year currently smoking OR have  quit w/in 15years.) does not qualify.   Lung Cancer Screening Referral:   Additional Screening:  Hepatitis C Screening: does not qualify; Completed 2019  Vision Screening: Recommended annual ophthalmology exams for early detection of glaucoma and other disorders of the eye. Is the patient up to date with their annual eye exam?  No  Who is the provider or what is the name of the office in which the patient attends annual [facility] exams[facility]  If pt is not established with a provider, would they like to be referred to a provider to establish care? No .   Dental Screening: Recommended annual dental exams for proper oral hygiene  Community Resource Referral / Chronic Care Management: CRR required this visit?  No   CCM required this visit?  No      Plan:     I have personally reviewed and noted the following in the patient's chart:   Medical and social history Use of alcohol, tobacco or illicit drugs  Current medications and supplements including opioid prescriptions.  Functional ability and status Nutritional status Physical activity Advanced directives List of other physicians Hospitalizations, surgeries, and ER visits in previous 12 months Vitals Screenings to include cognitive, depression, and falls Referrals and appointments  In addition, I have reviewed and discussed with patient certain preventive protocols, quality metrics, and best practice recommendations. A written personalized care plan for preventive services as well as general preventive health recommendations were provided to patient.     JLeroy Kennedy LPN   [id number]  Nurse Notes:

## 2021-06-21 ENCOUNTER — Ambulatory Visit: Payer: Self-pay | Admitting: Podiatry

## 2021-06-24 ENCOUNTER — Other Ambulatory Visit (HOSPITAL_BASED_OUTPATIENT_CLINIC_OR_DEPARTMENT_OTHER): Payer: Self-pay

## 2021-06-24 ENCOUNTER — Telehealth: Payer: Self-pay | Admitting: Internal Medicine

## 2021-06-24 DIAGNOSIS — N130 Hydronephrosis with ureteropelvic junction obstruction: Secondary | ICD-10-CM | POA: Diagnosis not present

## 2021-06-24 DIAGNOSIS — R269 Unspecified abnormalities of gait and mobility: Secondary | ICD-10-CM

## 2021-06-24 MED ORDER — TRAMADOL HCL 50 MG PO TABS
ORAL_TABLET | ORAL | 0 refills | Status: DC
  Filled 2021-06-24: qty 10, 3d supply, fill #0

## 2021-06-24 NOTE — Telephone Encounter (Signed)
[facility] for [employer] rx - done hardcopy to CMA

## 2021-06-24 NOTE — Telephone Encounter (Signed)
Patient would like to have a prescription for a walker - she is having difficulty getting around in her house.  Please Advise

## 2021-06-26 NOTE — Telephone Encounter (Signed)
Order has been faxed

## 2021-06-28 DIAGNOSIS — G4733 Obstructive sleep apnea (adult) (pediatric): Secondary | ICD-10-CM | POA: Diagnosis not present

## 2021-06-28 DIAGNOSIS — R2689 Other abnormalities of gait and mobility: Secondary | ICD-10-CM | POA: Diagnosis not present

## 2021-07-03 ENCOUNTER — Inpatient Hospital Stay (HOSPITAL_COMMUNITY): Payer: Medicare Other

## 2021-07-03 ENCOUNTER — Inpatient Hospital Stay (HOSPITAL_BASED_OUTPATIENT_CLINIC_OR_DEPARTMENT_OTHER): Payer: Medicare Other | Admitting: Hematology

## 2021-07-03 ENCOUNTER — Encounter (HOSPITAL_COMMUNITY): Payer: Self-pay | Admitting: Hematology

## 2021-07-03 ENCOUNTER — Inpatient Hospital Stay (HOSPITAL_COMMUNITY): Payer: Medicare Other | Attending: Hematology

## 2021-07-03 VITALS — BP 164/80 | HR 96 | Temp 97.9°F | Resp 18

## 2021-07-03 DIAGNOSIS — Z90710 Acquired absence of both cervix and uterus: Secondary | ICD-10-CM | POA: Insufficient documentation

## 2021-07-03 DIAGNOSIS — C563 Malignant neoplasm of bilateral ovaries: Secondary | ICD-10-CM | POA: Diagnosis not present

## 2021-07-03 DIAGNOSIS — C569 Malignant neoplasm of unspecified ovary: Secondary | ICD-10-CM | POA: Diagnosis not present

## 2021-07-03 DIAGNOSIS — Z803 Family history of malignant neoplasm of breast: Secondary | ICD-10-CM | POA: Diagnosis not present

## 2021-07-03 DIAGNOSIS — Z808 Family history of malignant neoplasm of other organs or systems: Secondary | ICD-10-CM | POA: Insufficient documentation

## 2021-07-03 DIAGNOSIS — Z5111 Encounter for antineoplastic chemotherapy: Secondary | ICD-10-CM | POA: Insufficient documentation

## 2021-07-03 DIAGNOSIS — I10 Essential (primary) hypertension: Secondary | ICD-10-CM | POA: Insufficient documentation

## 2021-07-03 DIAGNOSIS — E119 Type 2 diabetes mellitus without complications: Secondary | ICD-10-CM | POA: Insufficient documentation

## 2021-07-03 DIAGNOSIS — Z95828 Presence of other vascular implants and grafts: Secondary | ICD-10-CM

## 2021-07-03 LAB — MAGNESIUM: Magnesium: 1.8 mg/dL (ref 1.7–2.4)

## 2021-07-03 LAB — COMPREHENSIVE METABOLIC PANEL
ALT: 11 U/L (ref 0–44)
AST: 18 U/L (ref 15–41)
Albumin: 3.9 g/dL (ref 3.5–5.0)
Alkaline Phosphatase: 64 U/L (ref 38–126)
Anion gap: 9 (ref 5–15)
BUN: 22 mg/dL (ref 8–23)
CO2: 21 mmol/L — ABNORMAL LOW (ref 22–32)
Calcium: 9.5 mg/dL (ref 8.9–10.3)
Chloride: 108 mmol/L (ref 98–111)
Creatinine, Ser: 1.08 mg/dL — ABNORMAL HIGH (ref 0.44–1.00)
GFR, Estimated: 53 mL/min — ABNORMAL LOW (ref >60)
Glucose, Bld: 178 mg/dL — ABNORMAL HIGH (ref 70–99)
Potassium: 3.9 mmol/L (ref 3.5–5.1)
Sodium: 138 mmol/L (ref 135–145)
Total Bilirubin: 0.4 mg/dL (ref 0.3–1.2)
Total Protein: 6.8 g/dL (ref 6.5–8.1)

## 2021-07-03 LAB — CBC WITH DIFFERENTIAL/PLATELET
Abs Immature Granulocytes: 0.01 10*3/uL (ref 0.00–0.07)
Basophils Absolute: 0 10*3/uL (ref 0.0–0.1)
Basophils Relative: 0 %
Eosinophils Absolute: 0 10*3/uL (ref 0.0–0.5)
Eosinophils Relative: 1 %
HCT: 29.9 % — ABNORMAL LOW (ref 36.0–46.0)
Hemoglobin: 9.8 g/dL — ABNORMAL LOW (ref 12.0–15.0)
Immature Granulocytes: 0 %
Lymphocytes Relative: 20 %
Lymphs Abs: 1.1 10*3/uL (ref 0.7–4.0)
MCH: 30.3 pg (ref 26.0–34.0)
MCHC: 32.8 g/dL (ref 30.0–36.0)
MCV: 92.6 fL (ref 80.0–100.0)
Monocytes Absolute: 0.5 10*3/uL (ref 0.1–1.0)
Monocytes Relative: 10 %
Neutro Abs: 3.6 10*3/uL (ref 1.7–7.7)
Neutrophils Relative %: 69 %
Platelets: 177 10*3/uL (ref 150–400)
RBC: 3.23 MIL/uL — ABNORMAL LOW (ref 3.87–5.11)
RDW: 15.3 % (ref 11.5–15.5)
WBC: 5.3 10*3/uL (ref 4.0–10.5)
nRBC: 0 % (ref 0.0–0.2)

## 2021-07-03 MED ORDER — LORAZEPAM 2 MG/ML IJ SOLN
1.0000 mg | Freq: Once | INTRAMUSCULAR | Status: AC
  Administered 2021-07-03: 1 mg via INTRAVENOUS
  Filled 2021-07-03: qty 1

## 2021-07-03 MED ORDER — SODIUM CHLORIDE 0.9 % IV SOLN: Freq: Once | INTRAVENOUS | Status: AC

## 2021-07-03 MED ORDER — HEPARIN SOD (PORK) LOCK FLUSH 100 UNIT/ML IV SOLN
500.0000 [IU] | Freq: Once | INTRAVENOUS | Status: AC | PRN
  Administered 2021-07-03: 500 [IU]

## 2021-07-03 MED ORDER — SODIUM CHLORIDE 0.9 % IV SOLN
10.0000 mg | Freq: Once | INTRAVENOUS | Status: AC
  Administered 2021-07-03: 10 mg via INTRAVENOUS
  Filled 2021-07-03: qty 10

## 2021-07-03 MED ORDER — FAMOTIDINE IN NACL 20-0.9 MG/50ML-% IV SOLN
20.0000 mg | Freq: Once | INTRAVENOUS | Status: AC
  Administered 2021-07-03: 20 mg via INTRAVENOUS
  Filled 2021-07-03: qty 50

## 2021-07-03 MED ORDER — SODIUM CHLORIDE 0.9 % IV SOLN
140.0000 mg/m2 | Freq: Once | INTRAVENOUS | Status: AC
  Administered 2021-07-03: 282 mg via INTRAVENOUS
  Filled 2021-07-03: qty 47

## 2021-07-03 MED ORDER — SODIUM CHLORIDE 0.9 % IV SOLN
150.0000 mg | Freq: Once | INTRAVENOUS | Status: AC
  Administered 2021-07-03: 150 mg via INTRAVENOUS
  Filled 2021-07-03: qty 150

## 2021-07-03 MED ORDER — DIPHENHYDRAMINE HCL 50 MG/ML IJ SOLN
50.0000 mg | Freq: Once | INTRAMUSCULAR | Status: AC
  Administered 2021-07-03: 50 mg via INTRAVENOUS
  Filled 2021-07-03: qty 1

## 2021-07-03 MED ORDER — PEGFILGRASTIM 6 MG/0.6ML ~~LOC~~ PSKT
6.0000 mg | PREFILLED_SYRINGE | Freq: Once | SUBCUTANEOUS | Status: AC
  Administered 2021-07-03: 6 mg via SUBCUTANEOUS
  Filled 2021-07-03: qty 0.6

## 2021-07-03 MED ORDER — SODIUM CHLORIDE 0.9 % IV SOLN
360.0000 mg | Freq: Once | INTRAVENOUS | Status: AC
  Administered 2021-07-03: 360 mg via INTRAVENOUS
  Filled 2021-07-03: qty 36

## 2021-07-03 MED ORDER — SODIUM CHLORIDE 0.9% FLUSH
10.0000 mL | INTRAVENOUS | Status: DC | PRN
  Administered 2021-07-03: 10 mL

## 2021-07-03 MED ORDER — PALONOSETRON HCL INJECTION 0.25 MG/5ML
0.2500 mg | Freq: Once | INTRAVENOUS | Status: AC
  Administered 2021-07-03: 0.25 mg via INTRAVENOUS
  Filled 2021-07-03: qty 5

## 2021-07-03 NOTE — Patient Instructions (Addendum)
[place] at [facility] Discharge Instructions   You were seen and examined today by Dr. Delton Coombes.  He reviewed the results of your lab work which are normal/stable.   We will proceed with your treatment today.   We will plan to repeat a CT scan prior to your next visit.   Return as scheduled in 3 weeks.       Thank you for choosing [place] at [facility] to provide your oncology and hematology care.  To afford each patient quality time with our provider, please arrive at least 15 minutes before your scheduled appointment time.   If you have a lab appointment with the [facility] please come in thru the Main Entrance and check in at the main information desk.  You need to re-schedule your appointment should you arrive 10 or more minutes late.  We strive to give you quality time with our providers, and arriving late affects you and other patients whose appointments are after yours.  Also, if you no show three or more times for appointments you may be dismissed from the clinic at the providers discretion.     Again, thank you for choosing [facility].  Our hope is that these requests will decrease the amount of time that you wait before being seen by our physicians.       _____________________________________________________________  Should you have questions after your visit to [facility], please contact our office at [phone] and follow the prompts.  Our office hours are 8:00 a.m. and 4:30 p.m. Monday - Friday.  Please note that voicemails left after 4:00 p.m. may not be returned until the following business day.  We are closed weekends and major holidays.  You do have access to a nurse 24-7, just call the main number to the clinic [phone] and do not press any options, hold on the line and a nurse will answer the phone.    For prescription refill requests, have your pharmacy contact our office and  allow 72 hours.    Due to Covid, you will need to wear a mask upon entering the hospital. If you do not have a mask, a mask will be given to you at the Main Entrance upon arrival. For doctor visits, patients may have 1 support person age 58 or older with them. For treatment visits, patients can not have anyone with them due to social distancing guidelines and our immunocompromised population.
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
## 2021-07-03 NOTE — Progress Notes (Signed)
Patient complained of restlessness and agitation after slow push (10 min) of benadryl IV. Received order for ativan '1mg'$  IV from Dr. Raliegh Ip.

## 2021-07-03 NOTE — Progress Notes (Signed)
Patients port flushed without difficulty.  Good blood return noted with no bruising or swelling noted at site.  Patient remains accessed for chemotherapy treatment.  

## 2021-07-03 NOTE — Progress Notes (Signed)
Per MD keep dose at '360mg'$ 

## 2021-07-04 LAB — CA 125: Cancer Antigen (CA) 125: 11.8 U/mL (ref 0.0–38.1)

## 2021-07-08 ENCOUNTER — Telehealth: Payer: Self-pay | Admitting: Internal Medicine

## 2021-07-08 MED ORDER — GLIMEPIRIDE 2 MG PO TABS
1.0000 mg | ORAL_TABLET | Freq: Every day | ORAL | 3 refills | Status: DC
  Filled 2021-07-08 – 2021-08-24 (×2): qty 45, 90d supply, fill #0

## 2021-07-09 ENCOUNTER — Other Ambulatory Visit (HOSPITAL_BASED_OUTPATIENT_CLINIC_OR_DEPARTMENT_OTHER): Payer: Self-pay

## 2021-07-18 ENCOUNTER — Ambulatory Visit (HOSPITAL_BASED_OUTPATIENT_CLINIC_OR_DEPARTMENT_OTHER)
Admission: RE | Admit: 2021-07-18 | Discharge: 2021-07-18 | Disposition: A | Discharge status: Home / Self Care | Payer: Medicare Other | Source: Ambulatory Visit | Attending: Hematology | Admitting: Hematology

## 2021-07-18 DIAGNOSIS — K449 Diaphragmatic hernia without obstruction or gangrene: Secondary | ICD-10-CM | POA: Insufficient documentation

## 2021-07-18 DIAGNOSIS — M47815 Spondylosis without myelopathy or radiculopathy, thoracolumbar region: Secondary | ICD-10-CM | POA: Diagnosis not present

## 2021-07-18 DIAGNOSIS — C569 Malignant neoplasm of unspecified ovary: Secondary | ICD-10-CM | POA: Insufficient documentation

## 2021-07-18 DIAGNOSIS — N1330 Unspecified hydronephrosis: Secondary | ICD-10-CM | POA: Diagnosis not present

## 2021-07-18 DIAGNOSIS — I700 Atherosclerosis of aorta: Secondary | ICD-10-CM | POA: Insufficient documentation

## 2021-07-18 DIAGNOSIS — R599 Enlarged lymph nodes, unspecified: Secondary | ICD-10-CM | POA: Insufficient documentation

## 2021-07-18 DIAGNOSIS — N134 Hydroureter: Secondary | ICD-10-CM | POA: Diagnosis not present

## 2021-07-18 MED ORDER — IOHEXOL 300 MG/ML  SOLN
100.0000 mL | Freq: Once | INTRAMUSCULAR | Status: AC | PRN
  Administered 2021-07-18: 85 mL via INTRAVENOUS

## 2021-07-22 ENCOUNTER — Other Ambulatory Visit (HOSPITAL_BASED_OUTPATIENT_CLINIC_OR_DEPARTMENT_OTHER): Payer: Self-pay

## 2021-07-23 ENCOUNTER — Other Ambulatory Visit: Payer: Self-pay | Admitting: Internal Medicine

## 2021-07-23 ENCOUNTER — Other Ambulatory Visit (HOSPITAL_BASED_OUTPATIENT_CLINIC_OR_DEPARTMENT_OTHER): Payer: Self-pay

## 2021-07-23 MED ORDER — ESZOPICLONE 3 MG PO TABS
3.0000 mg | ORAL_TABLET | Freq: Every day | ORAL | 1 refills | Status: DC
  Filled 2021-07-23: qty 90, 90d supply, fill #0

## 2021-07-23 MED FILL — Fosaprepitant Dimeglumine For IV Infusion 150 MG (Base Eq): INTRAVENOUS | Qty: 5 | Status: AC

## 2021-07-23 MED FILL — Dexamethasone Sodium Phosphate Inj 100 MG/10ML: INTRAMUSCULAR | Qty: 1 | Status: AC

## 2021-07-24 ENCOUNTER — Inpatient Hospital Stay (HOSPITAL_COMMUNITY): Payer: Medicare Other | Attending: Hematology

## 2021-07-24 ENCOUNTER — Inpatient Hospital Stay (HOSPITAL_COMMUNITY): Payer: Medicare Other

## 2021-07-24 ENCOUNTER — Inpatient Hospital Stay (HOSPITAL_BASED_OUTPATIENT_CLINIC_OR_DEPARTMENT_OTHER): Payer: Medicare Other | Admitting: Hematology

## 2021-07-24 VITALS — BP 144/73 | HR 93 | Temp 98.5°F | Resp 18 | Wt 189.4 lb

## 2021-07-24 VITALS — BP 147/78 | HR 88 | Temp 98.4°F | Resp 18

## 2021-07-24 DIAGNOSIS — C569 Malignant neoplasm of unspecified ovary: Secondary | ICD-10-CM

## 2021-07-24 DIAGNOSIS — Z5111 Encounter for antineoplastic chemotherapy: Secondary | ICD-10-CM | POA: Diagnosis not present

## 2021-07-24 DIAGNOSIS — C563 Malignant neoplasm of bilateral ovaries: Secondary | ICD-10-CM | POA: Diagnosis not present

## 2021-07-24 DIAGNOSIS — Z95828 Presence of other vascular implants and grafts: Secondary | ICD-10-CM

## 2021-07-24 LAB — COMPREHENSIVE METABOLIC PANEL
ALT: 9 U/L (ref 0–44)
AST: 17 U/L (ref 15–41)
Albumin: 4.1 g/dL (ref 3.5–5.0)
Alkaline Phosphatase: 71 U/L (ref 38–126)
Anion gap: 8 (ref 5–15)
BUN: 24 mg/dL — ABNORMAL HIGH (ref 8–23)
CO2: 21 mmol/L — ABNORMAL LOW (ref 22–32)
Calcium: 9.6 mg/dL (ref 8.9–10.3)
Chloride: 109 mmol/L (ref 98–111)
Creatinine, Ser: 1.32 mg/dL — ABNORMAL HIGH (ref 0.44–1.00)
GFR, Estimated: 42 mL/min — ABNORMAL LOW (ref >60)
Glucose, Bld: 186 mg/dL — ABNORMAL HIGH (ref 70–99)
Potassium: 3.9 mmol/L (ref 3.5–5.1)
Sodium: 138 mmol/L (ref 135–145)
Total Bilirubin: 0.4 mg/dL (ref 0.3–1.2)
Total Protein: 7 g/dL (ref 6.5–8.1)

## 2021-07-24 LAB — CBC WITH DIFFERENTIAL/PLATELET
Abs Immature Granulocytes: 0.02 10*3/uL (ref 0.00–0.07)
Basophils Absolute: 0 10*3/uL (ref 0.0–0.1)
Basophils Relative: 0 %
Eosinophils Absolute: 0 10*3/uL (ref 0.0–0.5)
Eosinophils Relative: 1 %
HCT: 30.1 % — ABNORMAL LOW (ref 36.0–46.0)
Hemoglobin: 9.9 g/dL — ABNORMAL LOW (ref 12.0–15.0)
Immature Granulocytes: 0 %
Lymphocytes Relative: 17 %
Lymphs Abs: 0.9 10*3/uL (ref 0.7–4.0)
MCH: 30.6 pg (ref 26.0–34.0)
MCHC: 32.9 g/dL (ref 30.0–36.0)
MCV: 92.9 fL (ref 80.0–100.0)
Monocytes Absolute: 0.5 10*3/uL (ref 0.1–1.0)
Monocytes Relative: 9 %
Neutro Abs: 4 10*3/uL (ref 1.7–7.7)
Neutrophils Relative %: 73 %
Platelets: 171 10*3/uL (ref 150–400)
RBC: 3.24 MIL/uL — ABNORMAL LOW (ref 3.87–5.11)
RDW: 15.9 % — ABNORMAL HIGH (ref 11.5–15.5)
WBC: 5.4 10*3/uL (ref 4.0–10.5)
nRBC: 0 % (ref 0.0–0.2)

## 2021-07-24 LAB — MAGNESIUM: Magnesium: 1.9 mg/dL (ref 1.7–2.4)

## 2021-07-24 MED ORDER — FAMOTIDINE IN NACL 20-0.9 MG/50ML-% IV SOLN
20.0000 mg | Freq: Once | INTRAVENOUS | Status: AC
  Administered 2021-07-24: 20 mg via INTRAVENOUS
  Filled 2021-07-24: qty 50

## 2021-07-24 MED ORDER — SODIUM CHLORIDE 0.9 % IV SOLN
10.0000 mg | Freq: Once | INTRAVENOUS | Status: AC
  Administered 2021-07-24: 10 mg via INTRAVENOUS
  Filled 2021-07-24: qty 10

## 2021-07-24 MED ORDER — SODIUM CHLORIDE 0.9 % IV SOLN
150.0000 mg | Freq: Once | INTRAVENOUS | Status: AC
  Administered 2021-07-24: 150 mg via INTRAVENOUS
  Filled 2021-07-24: qty 150

## 2021-07-24 MED ORDER — SODIUM CHLORIDE 0.9% FLUSH
10.0000 mL | INTRAVENOUS | Status: DC | PRN
  Administered 2021-07-24: 10 mL

## 2021-07-24 MED ORDER — SODIUM CHLORIDE 0.9 % IV SOLN: Freq: Once | INTRAVENOUS | Status: AC

## 2021-07-24 MED ORDER — SODIUM CHLORIDE 0.9 % IV SOLN
381.0000 mg | Freq: Once | INTRAVENOUS | Status: AC
  Administered 2021-07-24: 380 mg via INTRAVENOUS
  Filled 2021-07-24: qty 38

## 2021-07-24 MED ORDER — LORAZEPAM 2 MG/ML IJ SOLN
1.0000 mg | Freq: Once | INTRAMUSCULAR | Status: AC
  Administered 2021-07-24: 1 mg via INTRAVENOUS
  Filled 2021-07-24: qty 1

## 2021-07-24 MED ORDER — PEGFILGRASTIM 6 MG/0.6ML ~~LOC~~ PSKT
6.0000 mg | PREFILLED_SYRINGE | Freq: Once | SUBCUTANEOUS | Status: AC
  Administered 2021-07-24: 6 mg via SUBCUTANEOUS
  Filled 2021-07-24: qty 0.6

## 2021-07-24 MED ORDER — DIPHENHYDRAMINE HCL 50 MG/ML IJ SOLN
25.0000 mg | Freq: Once | INTRAMUSCULAR | Status: AC
  Administered 2021-07-24: 25 mg via INTRAVENOUS
  Filled 2021-07-24: qty 1

## 2021-07-24 MED ORDER — HEPARIN SOD (PORK) LOCK FLUSH 100 UNIT/ML IV SOLN
500.0000 [IU] | Freq: Once | INTRAVENOUS | Status: AC | PRN
  Administered 2021-07-24: 500 [IU]

## 2021-07-24 MED ORDER — SODIUM CHLORIDE 0.9 % IV SOLN
140.0000 mg/m2 | Freq: Once | INTRAVENOUS | Status: AC
  Administered 2021-07-24: 282 mg via INTRAVENOUS
  Filled 2021-07-24: qty 47

## 2021-07-24 MED ORDER — PALONOSETRON HCL INJECTION 0.25 MG/5ML
0.2500 mg | Freq: Once | INTRAVENOUS | Status: AC
  Administered 2021-07-24: 0.25 mg via INTRAVENOUS
  Filled 2021-07-24: qty 5

## 2021-07-24 NOTE — Progress Notes (Signed)
Patient tolerated chemotherapy with no complaints voiced. Side effects with management reviewed understanding verbalized. Port site clean and dry with no bruising or swelling noted at site. Good blood return noted before and after administration of chemotherapy. Band aid applied. Patient left in satisfactory condition with VSS and no s/s of distress noted. 
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
## 2021-07-24 NOTE — Progress Notes (Signed)
Patients port flushed without difficulty.  Good blood return noted with no bruising or swelling noted at site.  Stable during access and blood draw.  Patient to remain accessed for treatment. 

## 2021-07-24 NOTE — Patient Instructions (Signed)
[facility]  Discharge Instructions: Thank you for choosing [facility] to provide your oncology and hematology care.  If you have a lab appointment with the [facility], please come in thru the Main Entrance and check in at the main information desk.  Wear comfortable clothing and clothing appropriate for easy access to any Portacath or PICC line.   We strive to give you quality time with your provider. You may need to reschedule your appointment if you arrive late (15 or more minutes).  Arriving late affects you and other patients whose appointments are after yours.  Also, if you miss three or more appointments without notifying the office, you may be dismissed from the clinic at the provider's discretion.      For prescription refill requests, have your pharmacy contact our office and allow 72 hours for refills to be completed.    Today you received the following chemotherapy and/or immunotherapy agents Taxol, Carboplatin and Onpro injection, return as scheduled. Carboplatin injection What is this medication? CARBOPLATIN (KAR boe pla tin) is a chemotherapy drug. It targets fast dividing cells, like cancer cells, and causes these cells to die. This medicine is used to treat ovarian cancer and many other cancers. This medicine may be used for other purposes; ask your health care provider or pharmacist if you have questions. COMMON BRAND NAME(S): Paraplatin What should I tell my care team before I take this medication? They need to know if you have any of these conditions: blood disorders hearing problems kidney disease recent or ongoing radiation therapy an unusual or allergic reaction to carboplatin, cisplatin, other chemotherapy, other medicines, foods, dyes, or preservatives pregnant or trying to get pregnant breast-feeding How should I use this medication? This drug is usually given as an infusion into a vein. It is administered in a hospital or clinic by a  specially trained health care professional. Talk to your pediatrician regarding the use of this medicine in children. Special care may be needed. Overdosage: If you think you have taken too much of this medicine contact a poison control center or emergency room at once. NOTE: This medicine is only for you. Do not share this medicine with others. What if I miss a dose? It is important not to miss a dose. Call your doctor or health care professional if you are unable to keep an appointment. What may interact with this medication? medicines for seizures medicines to increase blood counts like filgrastim, pegfilgrastim, sargramostim some antibiotics like amikacin, gentamicin, neomycin, streptomycin, tobramycin vaccines Talk to your doctor or health care professional before taking any of these medicines: acetaminophen aspirin ibuprofen ketoprofen naproxen This list may not describe all possible interactions. Give your health care provider a list of all the medicines, herbs, non-prescription drugs, or dietary supplements you use. Also tell them if you smoke, drink alcohol, or use illegal drugs. Some items may interact with your medicine. What should I watch for while using this medication? Your condition will be monitored carefully while you are receiving this medicine. You will need important blood work done while you are taking this medicine. This drug may make you feel generally unwell. This is not uncommon, as chemotherapy can affect healthy cells as well as cancer cells. Report any side effects. Continue your course of treatment even though you feel ill unless your doctor tells you to stop. In some cases, you may be given additional medicines to help with side effects. Follow all directions for their use. Call your  doctor or health care professional for advice if you get a fever, chills or sore throat, or other symptoms of a cold or flu. Do not treat yourself. This drug decreases your body's  ability to fight infections. Try to avoid being around people who are sick. This medicine may increase your risk to bruise or bleed. Call your doctor or health care professional if you notice any unusual bleeding. Be careful brushing and flossing your teeth or using a toothpick because you may get an infection or bleed more easily. If you have any dental work done, tell your dentist you are receiving this medicine. Avoid taking products that contain aspirin, acetaminophen, ibuprofen, naproxen, or ketoprofen unless instructed by your doctor. These medicines may hide a fever. Do not become pregnant while taking this medicine. Women should inform their doctor if they wish to become pregnant or think they might be pregnant. There is a potential for serious side effects to an unborn child. Talk to your health care professional or pharmacist for more information. Do not breast-feed an infant while taking this medicine. What side effects may I notice from receiving this medication? Side effects that you should report to your doctor or health care professional as soon as possible: allergic reactions like skin rash, itching or hives, swelling of the face, lips, or tongue signs of infection - fever or chills, cough, sore throat, pain or difficulty passing urine signs of decreased platelets or bleeding - bruising, pinpoint red spots on the skin, black, tarry stools, nosebleeds signs of decreased red blood cells - unusually weak or tired, fainting spells, lightheadedness breathing problems changes in hearing changes in vision chest pain high blood pressure low blood counts - This drug may decrease the number of white blood cells, red blood cells and platelets. You may be at increased risk for infections and bleeding. nausea and vomiting pain, swelling, redness or irritation at the injection site pain, tingling, numbness in the hands or feet problems with balance, talking, walking trouble passing urine or  change in the amount of urine Side effects that usually do not require medical attention (report to your doctor or health care professional if they continue or are bothersome): hair loss loss of appetite metallic taste in the mouth or changes in taste This list may not describe all possible side effects. Call your doctor for medical advice about side effects. You may report side effects to FDA at 1-800-FDA-1088. Where should I keep my medication? This drug is given in a hospital or clinic and will not be stored at home. NOTE: This sheet is a summary. It may not cover all possible information. If you have questions about this medicine, talk to your doctor, pharmacist, or health care provider.  2023 Elsevier/Gold Standard (2007-06-09 00:00:00) Paclitaxel injection What is this medication? PACLITAXEL (PAK li TAX el) is a chemotherapy drug. It targets fast dividing cells, like cancer cells, and causes these cells to die. This medicine is used to treat ovarian cancer, breast cancer, lung cancer, Kaposi's sarcoma, and other cancers. This medicine may be used for other purposes; ask your health care provider or pharmacist if you have questions. COMMON BRAND NAME(S): Onxol, Taxol What should I tell my care team before I take this medication? They need to know if you have any of these conditions: history of irregular heartbeat liver disease low blood counts, like low white cell, platelet, or red cell counts lung or breathing disease, like asthma tingling of the fingers or toes, or other nerve disorder  an unusual or allergic reaction to paclitaxel, alcohol, polyoxyethylated castor oil, other chemotherapy, other medicines, foods, dyes, or preservatives pregnant or trying to get pregnant breast-feeding How should I use this medication? This drug is given as an infusion into a vein. It is administered in a hospital or clinic by a specially trained health care professional. Talk to your pediatrician  regarding the use of this medicine in children. Special care may be needed. Overdosage: If you think you have taken too much of this medicine contact a poison control center or emergency room at once. NOTE: This medicine is only for you. Do not share this medicine with others. What if I miss a dose? It is important not to miss your dose. Call your doctor or health care professional if you are unable to keep an appointment. What may interact with this medication? Do not take this medicine with any of the following medications: live virus vaccines This medicine may also interact with the following medications: antiviral medicines for hepatitis, HIV or AIDS certain antibiotics like erythromycin and clarithromycin certain medicines for fungal infections like ketoconazole and itraconazole certain medicines for seizures like carbamazepine, phenobarbital, phenytoin gemfibrozil nefazodone rifampin St. John's wort This list may not describe all possible interactions. Give your health care provider a list of all the medicines, herbs, non-prescription drugs, or dietary supplements you use. Also tell them if you smoke, drink alcohol, or use illegal drugs. Some items may interact with your medicine. What should I watch for while using this medication? Your condition will be monitored carefully while you are receiving this medicine. You will need important blood work done while you are taking this medicine. This medicine can cause serious allergic reactions. To reduce your risk you will need to take other medicine(s) before treatment with this medicine. If you experience allergic reactions like skin rash, itching or hives, swelling of the face, lips, or tongue, tell your doctor or health care professional right away. In some cases, you may be given additional medicines to help with side effects. Follow all directions for their use. This drug may make you feel generally unwell. This is not uncommon, as  chemotherapy can affect healthy cells as well as cancer cells. Report any side effects. Continue your course of treatment even though you feel ill unless your doctor tells you to stop. Call your doctor or health care professional for advice if you get a fever, chills or sore throat, or other symptoms of a cold or flu. Do not treat yourself. This drug decreases your body's ability to fight infections. Try to avoid being around people who are sick. This medicine may increase your risk to bruise or bleed. Call your doctor or health care professional if you notice any unusual bleeding. Be careful brushing and flossing your teeth or using a toothpick because you may get an infection or bleed more easily. If you have any dental work done, tell your dentist you are receiving this medicine. Avoid taking products that contain aspirin, acetaminophen, ibuprofen, naproxen, or ketoprofen unless instructed by your doctor. These medicines may hide a fever. Do not become pregnant while taking this medicine. Women should inform their doctor if they wish to become pregnant or think they might be pregnant. There is a potential for serious side effects to an unborn child. Talk to your health care professional or pharmacist for more information. Do not breast-feed an infant while taking this medicine. Men are advised not to father a child while receiving this medicine. This product  may contain alcohol. Ask your pharmacist or healthcare provider if this medicine contains alcohol. Be sure to tell all healthcare providers you are taking this medicine. Certain medicines, like metronidazole and disulfiram, can cause an unpleasant reaction when taken with alcohol. The reaction includes flushing, headache, nausea, vomiting, sweating, and increased thirst. The reaction can last from 30 minutes to several hours. What side effects may I notice from receiving this medication? Side effects that you should report to your doctor or health  care professional as soon as possible: allergic reactions like skin rash, itching or hives, swelling of the face, lips, or tongue breathing problems changes in vision fast, irregular heartbeat high or low blood pressure mouth sores pain, tingling, numbness in the hands or feet signs of decreased platelets or bleeding - bruising, pinpoint red spots on the skin, black, tarry stools, blood in the urine signs of decreased red blood cells - unusually weak or tired, feeling faint or lightheaded, falls signs of infection - fever or chills, cough, sore throat, pain or difficulty passing urine signs and symptoms of liver injury like dark yellow or brown urine; general ill feeling or flu-like symptoms; light-colored stools; loss of appetite; nausea; right upper belly pain; unusually weak or tired; yellowing of the eyes or skin swelling of the ankles, feet, hands unusually slow heartbeat Side effects that usually do not require medical attention (report to your doctor or health care professional if they continue or are bothersome): diarrhea hair loss loss of appetite muscle or joint pain nausea, vomiting pain, redness, or irritation at site where injected tiredness This list may not describe all possible side effects. Call your doctor for medical advice about side effects. You may report side effects to FDA at 1-800-FDA-1088. Where should I keep my medication? This drug is given in a hospital or clinic and will not be stored at home. NOTE: This sheet is a summary. It may not cover all possible information. If you have questions about this medicine, talk to your doctor, pharmacist, or health care provider.  2023 Elsevier/Gold Standard (2020-11-30 00:00:00)  To help prevent nausea and vomiting after your treatment, we encourage you to take your nausea medication as directed.  BELOW ARE SYMPTOMS THAT SHOULD BE REPORTED IMMEDIATELY: *FEVER GREATER THAN 100.4 F (38 C) OR HIGHER *CHILLS OR  SWEATING *NAUSEA AND VOMITING THAT IS NOT CONTROLLED WITH YOUR NAUSEA MEDICATION *UNUSUAL SHORTNESS OF BREATH *UNUSUAL BRUISING OR BLEEDING *URINARY PROBLEMS (pain or burning when urinating, or frequent urination) *BOWEL PROBLEMS (unusual diarrhea, constipation, pain near the anus) TENDERNESS IN MOUTH AND THROAT WITH OR WITHOUT PRESENCE OF ULCERS (sore throat, sores in mouth, or a toothache) UNUSUAL RASH, SWELLING OR PAIN  UNUSUAL VAGINAL DISCHARGE OR ITCHING   Items with * indicate a potential emergency and should be followed up as soon as possible or go to the Emergency Department if any problems should occur.  Please show the CHEMOTHERAPY ALERT CARD or IMMUNOTHERAPY ALERT CARD at check-in to the Emergency Department and triage nurse.  Should you have questions after your visit or need to cancel or reschedule your appointment, please contact [facility] [phone]  and follow the prompts.  Office hours are 8:00 a.m. to 4:30 p.m. Monday - Friday. Please note that voicemails left after 4:00 p.m. may not be returned until the following business day.  We are closed weekends and major holidays. You have access to a nurse at all times for urgent questions. Please call the main number to the clinic [phone]  and follow the prompts.  For any non-urgent questions, you may also contact your provider using MyChart. We now offer e-Visits for anyone 34 and older to request care online for non-urgent symptoms. For details visit [email or web address].   Also download the MyChart app! Go to the app store, search "MyChart", open the app, select [facility], and log in with your MyChart username and password.  Masks are optional in the cancer centers. If you would like for your care team to wear a mask while they are taking care of you, please let them know. For doctor visits, patients may have with them one support person who is at least 77 years old. At this time, visitors are not allowed in  the infusion area.

## 2021-07-24 NOTE — Patient Instructions (Addendum)
[facility] Discharge Instructions   You were seen and examined today by Dr. Delton Coombes.  He reviewed the results of your lab work which is normal/stable. He reviewed the results of your CT scan which is stable. The cancer has not shrunk, but it has not grown either.   We will proceed with your treatment today.  Return as scheduled.    Thank you for choosing [facility] at [facility] to provide your oncology and hematology care.  To afford each patient quality time with our provider, please arrive at least 15 minutes before your scheduled appointment time.   If you have a lab appointment with the [facility] please come in thru the Main Entrance and check in at the main information desk.  You need to re-schedule your appointment should you arrive 10 or more minutes late.  We strive to give you quality time with our providers, and arriving late affects you and other patients whose appointments are after yours.  Also, if you no show three or more times for appointments you may be dismissed from the clinic at the providers discretion.     Again, thank you for choosing [facility].  Our hope is that these requests will decrease the amount of time that you wait before being seen by our physicians.       _____________________________________________________________  Should you have questions after your visit to [facility], please contact our office at [phone] and follow the prompts.  Our office hours are 8:00 a.m. and 4:30 p.m. Monday - Friday.  Please note that voicemails left after 4:00 p.m. may not be returned until the following business day.  We are closed weekends and major holidays.  You do have access to a nurse 24-7, just call the main number to the clinic [phone] and do not press any options, hold on the line and a nurse will answer the phone.    For prescription refill requests, have your  pharmacy contact our office and allow 72 hours.    Due to Covid, you will need to wear a mask upon entering the hospital. If you do not have a mask, a mask will be given to you at the Main Entrance upon arrival. For doctor visits, patients may have 1 support person age 70 or older with them. For treatment visits, patients can not have anyone with them due to social distancing guidelines and our immunocompromised population.

## 2021-07-26 ENCOUNTER — Ambulatory Visit (HOSPITAL_COMMUNITY): Payer: Medicare Other

## 2021-08-05 ENCOUNTER — Other Ambulatory Visit: Payer: Self-pay

## 2021-08-07 ENCOUNTER — Ambulatory Visit (INDEPENDENT_AMBULATORY_CARE_PROVIDER_SITE_OTHER): Payer: Medicare Other | Admitting: Internal Medicine

## 2021-08-07 ENCOUNTER — Encounter: Payer: Self-pay | Admitting: Internal Medicine

## 2021-08-07 ENCOUNTER — Other Ambulatory Visit (HOSPITAL_BASED_OUTPATIENT_CLINIC_OR_DEPARTMENT_OTHER): Payer: Self-pay

## 2021-08-07 ENCOUNTER — Ambulatory Visit (INDEPENDENT_AMBULATORY_CARE_PROVIDER_SITE_OTHER): Payer: Medicare Other

## 2021-08-07 VITALS — BP 124/68 | HR 84 | Temp 98.7°F | Ht 62.99 in | Wt 189.0 lb

## 2021-08-07 DIAGNOSIS — I10 Essential (primary) hypertension: Secondary | ICD-10-CM

## 2021-08-07 DIAGNOSIS — R079 Chest pain, unspecified: Secondary | ICD-10-CM | POA: Diagnosis not present

## 2021-08-07 DIAGNOSIS — E559 Vitamin D deficiency, unspecified: Secondary | ICD-10-CM

## 2021-08-07 DIAGNOSIS — F419 Anxiety disorder, unspecified: Secondary | ICD-10-CM

## 2021-08-07 DIAGNOSIS — K219 Gastro-esophageal reflux disease without esophagitis: Secondary | ICD-10-CM | POA: Diagnosis not present

## 2021-08-07 DIAGNOSIS — R0600 Dyspnea, unspecified: Secondary | ICD-10-CM | POA: Diagnosis not present

## 2021-08-07 DIAGNOSIS — R0609 Other forms of dyspnea: Secondary | ICD-10-CM

## 2021-08-07 DIAGNOSIS — E119 Type 2 diabetes mellitus without complications: Secondary | ICD-10-CM

## 2021-08-07 LAB — POCT GLYCOSYLATED HEMOGLOBIN (HGB A1C): Hemoglobin A1C: 6.2 % — AB (ref 4.0–5.6)

## 2021-08-07 MED ORDER — PANTOPRAZOLE SODIUM 40 MG PO TBEC
40.0000 mg | DELAYED_RELEASE_TABLET | Freq: Every day | ORAL | 3 refills | Status: DC
  Filled 2021-08-07: qty 90, 90d supply, fill #0

## 2021-08-07 MED ORDER — AMLODIPINE BESYLATE 5 MG PO TABS: 2.5000 mg | ORAL_TABLET | Freq: Every day | ORAL | 3 refills | Status: DC

## 2021-08-07 NOTE — Assessment & Plan Note (Signed)
Last vitamin D Lab Results  Component Value Date   VD25OH 49.13 03/06/2021   Stable, cont oral replacement  

## 2021-08-07 NOTE — Progress Notes (Signed)
Patient ID: Laura [name], female   DOB: Nov 03, 1945, 76 y.o.   MRN: [id number]        Chief Complaint: follow up HTN, HLD and hyperglycemia, cp, doe, ckd, gerd, anxiety       HPI:  Laura [name] is a 76 y.o. female here with c/o 2-3 midl intermittent chest pain, pressure without radiation, sob, palps, nv, diaphoresis, wheezing, orthopnea, PND, increased LE swelling, dizziness or syncope. Does have at other times mild doe over the last 2-3 months.  Also noted Hgb drifting from 11.2 to 9.9 over similar time.   Pt denies fever, wt loss, night sweats, loss of appetite, or other constitutional symptoms   Pt denies polydipsia, polyuria, or new focal neuro s/s.   Denies worsening depressive symptoms, suicidal ideation, or panic.  Doex also c/o mild to mod worsening reflux in the last 6 mo different from above, but no abd pain, dysphagia, n/v, bowel change or blood.        Wt Readings from Last 3 Encounters:  08/08/21 189 lb (85.7 kg)  08/07/21 189 lb (85.7 kg)  07/24/21 189 lb 6 oz (85.9 kg)   BP Readings from Last 3 Encounters:  08/08/21 140/70  08/07/21 124/68  07/24/21 (!) 144/73         Past Medical History:  Diagnosis Date   Anemia    Anxiety    Arthritis    back of neck, bones spurs on neck   Cancer (HCC)    ovarian cancer   Cataract both eyes    Cervical disc disease    Diabetes mellitus Type 2    Family history of adverse reaction to anesthesia    sister slow to awaken   Family history of thyroid cancer    GERD (gastroesophageal reflux disease)    Heart murmur    History of COVID-19 06/2020   pain in side took paxlovid x 5 days all symptoms resolved   History of radiation therapy    abdominal lymph nodes 01/21/2021-02/01/2021  Dr Gery Pray   Hydronephrosis 08/24/2020   Right Kidney (stable per CT)   Hyperlipidemia    Hypertension    IBS (irritable bowel syndrome)    Left shoulder frozen with limited rom    Mucoid cyst of joint    right thumb   Neuropathy 03/12/2021    hands/fingers both hands numb and tingle @ times   Port-A-Cath in place 07/21/2019   Reflux    Sleep apnea 03/12/2021   has not used cpap in 3 years   Vertigo 12/2010   none since treated at [place]   Wears glasses for reading    Wears partial dentures upper    Past Surgical History:  Procedure Laterality Date   BLADDER SURGERY     [facility]   BREAST EXCISIONAL BIOPSY Bilateral    BREAST SURGERY     fibroid cyst removed ? over 10 yrs ago at [facility] per pt on 03-12-2021   CHOLECYSTECTOMY     yrs ago   COLONOSCOPY  07/02/2020   and 09-19-2016   CYSTOSCOPY W/ URETERAL STENT PLACEMENT Right 03/13/2021   Procedure: CYSTOSCOPY WITH RETROGRADE PYELOGRAM/URETERAL STENT PLACEMENT;  Surgeon: Robley Fries, MD;  Location: [facility];  Service: Urology;  Laterality: Right;  30 MINS   fibroids removed     breast (both breasts) age 72 and total of 3 surgeries   history of chemotherapy     6 rounds june 2021  IR IMAGING GUIDED PORT INSERTION  07/26/2019   right   MASS EXCISION Right 06/26/2016   Procedure: EXCISION MUCOID TUMOR RIGHT THUMB, IP RIGHT THUMB;  Surgeon: Daryll Brod, MD;  Location: [place];  Service: Orthopedics;  Laterality: Right;   ROBOTIC ASSISTED BILATERAL SALPINGO OOPHERECTOMY N/A 01/03/2020   Procedure: XI ROBOTIC ASSISTED BILATERAL SALPINGO OOPHORECTOMY, RADICAL TUMOR DEBULKING;  Surgeon: Everitt Amber, MD;  Location: [facility];  Service: Gynecology;  Laterality: N/A;   ROBOTIC PELVIC AND PARA-AORTIC LYMPH NODE DISSECTION N/A 01/03/2020   Procedure: XI ROBOTIC PARA-AORTIC LYMPHADENECTOMY;  Surgeon: Everitt Amber, MD;  Location: [facility];  Service: Gynecology;  Laterality: N/A;   VAGINAL HYSTERECTOMY     age late 65's    reports that she has never smoked. She has never used smokeless tobacco. She reports that she does not drink alcohol and does not use drugs. family history includes Breast cancer in her sister; Diabetes in her father, maternal aunt,  and sister; Hypertension in her father, mother, sister, and another family member; Stroke in her sister; Thyroid cancer (age of onset: 29) in her daughter. Allergies  Allergen Reactions   Ciprofloxacin Swelling    Torn tendon   Hydrocodone Bit-Homatrop Mbr Other (See Comments)    Vertigo *pt strongly prefers to never take* took 3 years to recover from   Sulfa Antibiotics Hives, Itching and Swelling    Tongue swells   Alfuzosin Other (See Comments)    Weakness and fatigue   Codeine Itching   Crestor [Rosuvastatin Calcium] Other (See Comments)    Did something to memory     Doxycycline Other (See Comments)    Severe rectal Gas.   Gabapentin     disoriented   Keflex [Cephalexin] Diarrhea and Nausea And Vomiting   Myrbetriq Andree [place] Er] Swelling    Swelling of legs and feet   Naproxen Other (See Comments)    Stomach cramps/loud gas   Statins     Muscle weakness   Prednisone Anxiety    *pt strongly prefers to never be given prednisone*    Current Outpatient Medications on File Prior to Visit  Medication Sig Dispense Refill   acetaminophen (TYLENOL) 500 MG tablet Take 1 tablet (500 mg total) by mouth every 6 (six) hours as needed. 30 tablet 0   Bismuth Subsalicylate (PEPTO-BISMOL MAX STRENGTH PO) Take by mouth as needed.     Blood Glucose Monitoring Suppl (ONE TOUCH ULTRA 2) w/Device KIT Use as directed 1 each 0   Cholecalciferol (VITAMIN D3) 50 MCG (2000 UT) TABS Take by mouth.     Eszopiclone 3 MG TABS Take 1 tablet (3 mg total) by mouth at bedtime. Take immediately before bedtime 90 tablet 1   Famotidine (PEPCID AC PO) Take by mouth as needed.     feeding supplement (BOOST HIGH PROTEIN) LIQD Take 237 mLs by mouth daily.     glimepiride (AMARYL) 2 MG tablet Take 0.5 tablets (1 mg total) by mouth daily before breakfast. 1/2 of 2 mg tab 90 tablet 3   glucose blood (ONETOUCH ULTRA) test strip USE TO CHECK BLOOD SUGARS TWO TIMES DAILY 100 strip 3   Lancets (ONETOUCH ULTRASOFT)  lancets 1 each by Other route as needed for other. Use as instructed 100 each 3   loratadine (CLARITIN) 10 MG tablet Take 10 mg by mouth daily as needed (allergies.).     LORazepam (ATIVAN) 0.5 MG tablet Take 1 tablet (0.5 mg total) by mouth 2 (two) times daily as needed  for anxiety. 60 tablet 0   traMADol (ULTRAM) 50 MG tablet 1 tablet PO Q 6 H PRN severe right lower quadrant pain 10 tablet 0   vitamin B-12 (CYANOCOBALAMIN) 1000 MCG tablet Take 1,000 mcg by mouth daily.      prochlorperazine (COMPAZINE) 10 MG tablet TAKE 1 TABLET (10 MG TOTAL) BY MOUTH EVERY 6 (SIX) HOURS AS NEEDED (NAUSEA OR VOMITING). 60 tablet 3   No current facility-administered medications on file prior to visit.        ROS:  All others reviewed and negative.  Objective        PE:  BP 124/68 (BP Location: Right Arm, Patient Position: Sitting, Cuff Size: Large)   Pulse 84   Temp 98.7 F (37.1 C) (Oral)   Ht 5' 2.99" (1.6 m)   Wt 189 lb (85.7 kg)   SpO2 94%   BMI 33.49 kg/m                 Constitutional: Pt appears in NAD               HENT: Head: NCAT.                Right Ear: External ear normal.                 Left Ear: External ear normal.                Eyes: . Pupils are equal, round, and reactive to light. Conjunctivae and EOM are normal               Nose: without d/c or deformity               Neck: Neck supple. Gross normal ROM               Cardiovascular: Normal rate and regular rhythm.                 Pulmonary/Chest: Effort normal and breath sounds without rales or wheezing.                Abd:  Soft, NT, ND, + BS, no organomegaly               Neurological: Pt is alert. At baseline orientation, motor grossly intact               Skin: Skin is warm. No rashes, no other new lesions, LE edema - none               Psychiatric: Pt behavior is normal without agitation   Micro: none  Cardiac tracings I have personally interpreted today:  ECG - SR 76, no acute changes  Pertinent Radiological  findings (summarize): none   Lab Results  Component Value Date   WBC 5.4 07/24/2021   HGB 9.9 (L) 07/24/2021   HCT 30.1 (L) 07/24/2021   PLT 171 07/24/2021   GLUCOSE 186 (H) 07/24/2021   CHOL 237 (H) 03/06/2021   TRIG 169.0 (H) 03/06/2021   HDL 49.20 03/06/2021   LDLDIRECT 172.0 02/28/2020   LDLCALC 154 (H) 03/06/2021   ALT 9 07/24/2021   AST 17 07/24/2021   NA 138 07/24/2021   K 3.9 07/24/2021   CL 109 07/24/2021   CREATININE 1.32 (H) 07/24/2021   BUN 24 (H) 07/24/2021   CO2 21 (L) 07/24/2021   TSH 1.38 03/06/2021   INR 1.0 06/06/2019   HGBA1C 6.2 (A) 08/07/2021   MICROALBUR 2.1 (H)  03/06/2021   Hemoglobin A1C 4.0 - 5.6 % 6.2 Abnormal   6.6 High  R, CM  6.3 Abnormal     Assessment/Plan:  Laura [name] is a 76 y.o. Black or African American [2] female with  has a past medical history of Anemia, Anxiety, Arthritis, Cancer ([place]), Cataract both eyes, Cervical disc disease, Diabetes mellitus Type 2, Family history of adverse reaction to anesthesia, Family history of thyroid cancer, GERD (gastroesophageal reflux disease), Heart murmur, History of COVID-19 (06/2020), History of radiation therapy, Hydronephrosis (08/24/2020), Hyperlipidemia, Hypertension, IBS (irritable bowel syndrome), Left shoulder frozen with limited rom, Mucoid cyst of joint, Neuropathy (03/12/2021), Port-A-Cath in place (07/21/2019), Reflux, Sleep apnea (03/12/2021), Vertigo (12/2010), Wears glasses for reading, and Wears partial dentures upper.  Vitamin D deficiency Last vitamin D Lab Results  Component Value Date   VD25OH 49.13 03/06/2021   Stable, cont oral replacement   Hypertension BP Readings from Last 3 Encounters:  08/08/21 140/70  08/07/21 124/68  07/24/21 (!) 144/73   Stable, pt to continue medical treatment norvasc 2.5 mg qd - taking HALF of prescribed 5 mg   GERD (gastroesophageal reflux disease) Uncontrolled mod worsening, for protonix 40 mg qd  DOE (dyspnea on exertion) Etiology  unclear, maybe multifactorial with age, wt , deconditioning, anxiety, anemia and recent cp of unclear etiology, for cxr  Lab Results  Component Value Date   WBC 5.4 07/24/2021   HGB 9.9 (L) 07/24/2021   HCT 30.1 (L) 07/24/2021   MCV 92.9 07/24/2021   PLT 171 07/24/2021    Diabetes mellitus type 2, noninsulin dependent ([place]) Lab Results  Component Value Date   HGBA1C 6.2 (A) 08/07/2021   Stable, cont current med tx - diet, wt control, excercise  Chest pain Atypical, etiology unclear, assoc also with general weakness, ecg reviewed, pt requests cardiology referral  Anxiety At least mild to mod situational, declines change in tx at this time, cont ativan prn  Followup: Return in about 3 months (around 11/07/2021).  Cathlean Cower, MD 08/09/2021 8:46 PM [facility]

## 2021-08-07 NOTE — Patient Instructions (Addendum)
Your EKG was done today  Your A1c was 6.2 today  Please take all new medication as prescribed - the protonix 40 mg per day  Please continue all other medications as before, including the HALF amlodipine  Please have the pharmacy call with any other refills you may need.  Please continue your efforts at being more active, low cholesterol diet, and weight control.  Please keep your appointments with your specialists as you may have planned  You will be contacted regarding the referral for: Cardiology  No further lab testing needed today  Please go to the XRAY Department in the first floor for the x-ray testing  You will be contacted by phone if any changes need to be made immediately.  Otherwise, you will receive a letter about your results with an explanation, but please check with MyChart first.  Please remember to sign up for MyChart if you have not done so, as this will be important to you in the future with finding out test results, communicating by private email, and scheduling acute appointments online when needed.  Please make an Appointment to return in 3 months, or sooner if needed

## 2021-08-08 ENCOUNTER — Encounter: Payer: Self-pay | Admitting: Internal Medicine

## 2021-08-08 ENCOUNTER — Other Ambulatory Visit: Payer: Self-pay

## 2021-08-08 ENCOUNTER — Ambulatory Visit (INDEPENDENT_AMBULATORY_CARE_PROVIDER_SITE_OTHER): Payer: Medicare Other | Admitting: Internal Medicine

## 2021-08-08 VITALS — BP 140/70 | HR 83 | Ht 63.0 in | Wt 189.0 lb

## 2021-08-08 DIAGNOSIS — I10 Essential (primary) hypertension: Secondary | ICD-10-CM

## 2021-08-08 DIAGNOSIS — I700 Atherosclerosis of aorta: Secondary | ICD-10-CM

## 2021-08-08 DIAGNOSIS — G4733 Obstructive sleep apnea (adult) (pediatric): Secondary | ICD-10-CM | POA: Diagnosis not present

## 2021-08-08 DIAGNOSIS — E785 Hyperlipidemia, unspecified: Secondary | ICD-10-CM

## 2021-08-08 DIAGNOSIS — C569 Malignant neoplasm of unspecified ovary: Secondary | ICD-10-CM

## 2021-08-08 DIAGNOSIS — R0609 Other forms of dyspnea: Secondary | ICD-10-CM

## 2021-08-08 NOTE — Patient Instructions (Addendum)
Medication Instructions:  No Changes In Medications at this time.  *If you need a refill on your cardiac medications before your next appointment, please call your pharmacy*  Testing/Procedures: Your physician has requested that you have an echocardiogram TOMORROW AT 10AM at [facility]. Echocardiography is a painless test that uses sound waves to create images of your heart. It provides your doctor with information about the size and shape of your heart and how well your heart's chambers and valves are working. You may receive an ultrasound enhancing agent through an IV if needed to better visualize your heart during the echo.This procedure takes approximately one hour. There are no restrictions for this procedure.   Your physician has recommended that you have a sleep study. This test records several body functions during sleep, including: brain activity, eye movement, oxygen and carbon dioxide blood levels, heart rate and rhythm, breathing rate and rhythm, the flow of air through your mouth and nose, snoring, body muscle movements, and chest and belly movement.  SOMEONE WILL CALL YOU TO GET YOU SCHEDULED FOR THIS   Follow-Up: At [facility], you and your health needs are our priority.  As part of our continuing mission to provide you with exceptional heart care, we have created designated Provider Care Teams.  These Care Teams include your primary Cardiologist (physician) and Advanced Practice Providers (APPs -  Physician Assistants and Nurse Practitioners) who all work together to provide you with the care you need, when you need it.  Your next appointment:   AUGUST 28th AT 1:00PM  The format for your next appointment:   In Person  Provider:   Elouise Munroe, MD

## 2021-08-08 NOTE — Progress Notes (Signed)
Cardiology Office Note:    Date:  08/08/2021   ID:  Laura [name], DOB 07/17/1945, MRN [id number]  PCP:  John, James W, MD  Cardiologist:   A , MD  Electrophysiologist:  None   Referring MD: John, James W, MD   Chief Complaint/Reason for Referral: DOE  History of Present Illness:    Laura [name] is a 75 y.o. 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  Short winded, when chemo started - May 2023.  Very independent her whole life. Not anymore. SOB with activity. Sitting fine, out of breath with moving or in the shower. Very sob with activity and bending over.  Echo in 2016, elevated PASP 56 MMhG Indigestion frequency worse after starting chemo - responds to pepcid. No significnat LE edema but mild swelling. Atherosclerosis noted previously. Dx with sleep apnea - had not used cpap but started back with cpap from her closet.  Snores, sleep study > 10 years ago. Needs repeat sleep study.   Fhx: brother with atrial fibrillation and CHF with defibrillator.   Chemo in 2021 - tolerated well.     Past Medical History:  Diagnosis Date   Anemia    Anxiety    Arthritis    back of neck, bones spurs on neck   Cancer (HCC)    ovarian cancer   Cataract both eyes    Cervical disc disease    Diabetes mellitus Type 2    Family history of adverse reaction to anesthesia    sister slow to awaken   Family history of thyroid cancer    GERD (gastroesophageal reflux disease)    Heart murmur    History of COVID-19 06/2020   pain in side took paxlovid x 5 days all symptoms resolved   History of radiation therapy    abdominal lymph nodes 01/21/2021-02/01/2021  Dr James Kinard   Hydronephrosis 08/24/2020   Right Kidney (stable per CT)   Hyperlipidemia    Hypertension    IBS (irritable bowel syndrome)    Left shoulder  frozen with limited rom    Mucoid cyst of joint    right thumb   Neuropathy 03/12/2021   hands/fingers both hands numb and tingle @ times   Port-A-Cath in place 07/21/2019   Reflux    Sleep apnea 03/12/2021   has not used cpap in 3 years   Vertigo 12/2010   none since treated at [facility]   Wears glasses for reading    Wears partial dentures upper     Past Surgical History:  Procedure Laterality Date   BLADDER SURGERY     x 3 at [facility]   BREAST EXCISIONAL BIOPSY Bilateral    BREAST SURGERY     fibroid cyst removed ? over 10 yrs ago at [facility] per pt on 03-12-2021   CHOLECYSTECTOMY     yrs ago   COLONOSCOPY  07/02/2020   and 09-19-2016   CYSTOSCOPY W/ URETERAL STENT PLACEMENT Right 03/13/2021   Procedure: CYSTOSCOPY WITH RETROGRADE PYELOGRAM/URETERAL STENT PLACEMENT;  Surgeon: Pace, Maryellen D, MD;  Location: [facility];  Service: Urology;  Laterality: Right;  30 MINS   fibroids removed     breast (both breasts) age 15 and total of 3 surgeries   history of chemotherapy     6 rounds june 2021   IR IMAGING GUIDED   PORT INSERTION  07/26/2019   right   MASS EXCISION Right 06/26/2016   Procedure: EXCISION MUCOID TUMOR RIGHT THUMB, IP RIGHT THUMB;  Surgeon: Kuzma, Gary, MD;  Location: [facility];  Service: Orthopedics;  Laterality: Right;   ROBOTIC ASSISTED BILATERAL SALPINGO OOPHERECTOMY N/A 01/03/2020   Procedure: XI ROBOTIC ASSISTED BILATERAL SALPINGO OOPHORECTOMY, RADICAL TUMOR DEBULKING;  Surgeon: Rossi, Emma, MD;  Location: [facility];  Service: Gynecology;  Laterality: N/A;   ROBOTIC PELVIC AND PARA-AORTIC LYMPH NODE DISSECTION N/A 01/03/2020   Procedure: XI ROBOTIC PARA-AORTIC LYMPHADENECTOMY;  Surgeon: Rossi, Emma, MD;  Location: [facility];  Service: Gynecology;  Laterality: N/A;   VAGINAL HYSTERECTOMY     age late 30's    Current Medications: Current Meds  Medication Sig   acetaminophen (TYLENOL) 500 MG tablet Take 1 tablet (500 mg total) by mouth every 6  (six) hours as needed.   Bismuth Subsalicylate (PEPTO-BISMOL MAX STRENGTH PO) Take by mouth as needed.   Blood Glucose Monitoring Suppl (ONE TOUCH ULTRA 2) w/Device KIT Use as directed   Cholecalciferol (VITAMIN D3) 50 MCG (2000 UT) TABS Take by mouth.   Eszopiclone 3 MG TABS Take 1 tablet (3 mg total) by mouth at bedtime. Take immediately before bedtime   Famotidine (PEPCID AC PO) Take by mouth as needed.   feeding supplement (BOOST HIGH PROTEIN) LIQD Take 237 mLs by mouth daily.   glimepiride (AMARYL) 2 MG tablet Take 0.5 tablets (1 mg total) by mouth daily before breakfast. 1/2 of 2 mg tab   glucose blood (ONETOUCH ULTRA) test strip USE TO CHECK BLOOD SUGARS TWO TIMES DAILY   Lancets (ONETOUCH ULTRASOFT) lancets 1 each by Other route as needed for other. Use as instructed   loratadine (CLARITIN) 10 MG tablet Take 10 mg by mouth daily as needed (allergies.).   LORazepam (ATIVAN) 0.5 MG tablet Take 1 tablet (0.5 mg total) by mouth 2 (two) times daily as needed for anxiety.   pantoprazole (PROTONIX) 40 MG tablet Take 1 tablet (40 mg total) by mouth daily.   prochlorperazine (COMPAZINE) 10 MG tablet TAKE 1 TABLET (10 MG TOTAL) BY MOUTH EVERY 6 (SIX) HOURS AS NEEDED (NAUSEA OR VOMITING).   traMADol (ULTRAM) 50 MG tablet 1 tablet PO Q 6 H PRN severe right lower quadrant pain   vitamin B-12 (CYANOCOBALAMIN) 1000 MCG tablet Take 1,000 mcg by mouth daily.    [DISCONTINUED] amLODipine (NORVASC) 5 MG tablet Take 0.5 tablets (2.5 mg total) by mouth daily. (Patient taking differently: Take 5 mg by mouth daily.)     Allergies:   Ciprofloxacin, Hydrocodone bit-homatrop mbr, Sulfa antibiotics, Alfuzosin, Codeine, Crestor [rosuvastatin calcium], Doxycycline, Gabapentin, Keflex [cephalexin], Myrbetriq [mirabegron er], Naproxen, Statins, and Prednisone   Social History   Tobacco Use   Smoking status: Never   Smokeless tobacco: Never  Vaping Use   Vaping Use: Never used  Substance Use Topics   Alcohol  use: No    Alcohol/week: 0.0 standard drinks of alcohol   Drug use: No     Family History: The patient's family history includes Breast cancer in her sister; Diabetes in her father, maternal aunt, and sister; Hypertension in her father, mother, sister, and another family member; Stroke in her sister; Thyroid cancer (age of onset: 45) in her daughter. There is no history of Colon cancer, Esophageal cancer, Stomach cancer, Rectal cancer, Endometrial cancer, or Ovarian cancer.  ROS:   Please see the history of present illness.    All other systems reviewed and are   negative.  EKGs/Labs/Other Studies Reviewed:    The following studies were reviewed today:  EKG:  NSR, low voltage  Imaging studies that I have independently reviewed today: PET 12/05/19 - 3 vessel coronary artery calcifications.  Recent Labs: 03/06/2021: TSH 1.38 08/14/2021: ALT 12; BUN 30; Creatinine, Ser 1.16; Hemoglobin 9.5; Magnesium 1.9; Platelets 154; Potassium 3.9; Sodium 139  Recent Lipid Panel    Component Value Date/Time   CHOL 237 (H) 03/06/2021 1346   TRIG 169.0 (H) 03/06/2021 1346   HDL 49.20 03/06/2021 1346   CHOLHDL 5 03/06/2021 1346   VLDL 33.8 03/06/2021 1346   LDLCALC 154 (H) 03/06/2021 1346   LDLDIRECT 172.0 02/28/2020 1519    Physical Exam:    VS:  BP 140/70   Pulse 83   Ht 5' 3" (1.6 m)   Wt 189 lb (85.7 kg)   SpO2 97%   BMI 33.48 kg/m     Wt Readings from Last 5 Encounters:  08/14/21 188 lb 8 oz (85.5 kg)  08/08/21 189 lb (85.7 kg)  08/07/21 189 lb (85.7 kg)  07/24/21 189 lb 6 oz (85.9 kg)  07/03/21 189 lb 14.4 oz (86.1 kg)    Constitutional: No acute distress Eyes: sclera non-icteric, normal conjunctiva and lids ENMT: normal dentition, moist mucous membranes Cardiovascular: regular rhythm, normal rate, no murmur. S1 and S2 normal. No jugular venous distention.  Respiratory: clear to auscultation bilaterally GI : normal bowel sounds, soft and nontender. No distention.   MSK:  extremities warm, well perfused. No edema.  NEURO: grossly nonfocal exam, moves all extremities. PSYCH: alert and oriented x 3, normal mood and affect.   ASSESSMENT:    1. DOE (dyspnea on exertion)   2. OSA (obstructive sleep apnea)   3. Aortic atherosclerosis ([place])   4. Primary hypertension   5. Hyperlipidemia, unspecified hyperlipidemia type   6. Malignant neoplasm of ovary, unspecified laterality ([place])    PLAN:    DOE (dyspnea on exertion) - Plan: EKG 12-Lead, ECHOCARDIOGRAM COMPLETE, Home sleep test OSA (obstructive sleep apnea) - in the setting of current chemotherapy, we will obtain an echocardiogram to evaluate for systolic dysfunction.  - She has known OSA but not currently treating. Echo to evaluate for PHTN, and repeat sleep test to get established with new equipment. - no chest pain, though does have coronary calcifications. Pending echo results will decide if CCTA or other ischemic evaluation warranted.   Aortic atherosclerosis ([place])  Primary hypertension - continue amlodipine 2.5 mg daily. BP mildly elevated but with ongoing chemotherapy will hold dose.  Hyperlipidemia, unspecified hyperlipidemia type - mixed hyperlipidemia. Not at goal. May want to ensure chemotherapy complete and will consider therapy. Discussion pending echo results.   Total time of encounter: 45 minutes total time of encounter, including 30 minutes spent in face-to-face patient care on the date of this encounter. This time includes coordination of care and counseling regarding above mentioned problem list. Remainder of non-face-to-face time involved reviewing chart documents/testing relevant to the patient encounter and documentation in the medical record. I have independently reviewed documentation from referring provider.   Cherlynn Kaiser, MD, [place]   Shared Decision Making/Informed Consent:       Medication Adjustments/Labs and Tests Ordered: Current medicines  are reviewed at length with the patient today.  Concerns regarding medicines are outlined above.   Orders Placed This Encounter  Procedures   EKG 12-Lead   ECHOCARDIOGRAM COMPLETE   Home sleep test    No orders  of the defined types were placed in this encounter.   Patient Instructions  Medication Instructions:  No Changes In Medications at this time.  *If you need a refill on your cardiac medications before your next appointment, please call your pharmacy*  Testing/Procedures: Your physician has requested that you have an echocardiogram TOMORROW AT 10AM at [facility]. Echocardiography is a painless test that uses sound waves to create images of your heart. It provides your doctor with information about the size and shape of your heart and how well your heart's chambers and valves are working. You may receive an ultrasound enhancing agent through an IV if needed to better visualize your heart during the echo.This procedure takes approximately one hour. There are no restrictions for this procedure.   Your physician has recommended that you have a sleep study. This test records several body functions during sleep, including: brain activity, eye movement, oxygen and carbon dioxide blood levels, heart rate and rhythm, breathing rate and rhythm, the flow of air through your mouth and nose, snoring, body muscle movements, and chest and belly movement.  SOMEONE WILL CALL YOU TO GET YOU SCHEDULED FOR THIS   Follow-Up: At [facility], you and your health needs are our priority.  As part of our continuing mission to provide you with exceptional heart care, we have created designated Provider Care Teams.  These Care Teams include your primary Cardiologist (physician) and Advanced Practice Providers (APPs -  Physician Assistants and Nurse Practitioners) who all work together to provide you with the care you need, when you need it.  Your next appointment:   AUGUST 28th AT 1:00PM  The  format for your next appointment:   In Person  Provider:   Elouise Munroe, MD

## 2021-08-09 ENCOUNTER — Encounter: Payer: Self-pay | Admitting: Internal Medicine

## 2021-08-09 ENCOUNTER — Ambulatory Visit (HOSPITAL_COMMUNITY)
Admission: RE | Admit: 2021-08-09 | Discharge: 2021-08-09 | Disposition: A | Discharge status: Home / Self Care | Payer: Medicare Other | Source: Ambulatory Visit | Attending: Internal Medicine | Admitting: Internal Medicine

## 2021-08-09 DIAGNOSIS — I081 Rheumatic disorders of both mitral and tricuspid valves: Secondary | ICD-10-CM | POA: Diagnosis not present

## 2021-08-09 DIAGNOSIS — E119 Type 2 diabetes mellitus without complications: Secondary | ICD-10-CM | POA: Insufficient documentation

## 2021-08-09 DIAGNOSIS — R0609 Other forms of dyspnea: Secondary | ICD-10-CM | POA: Diagnosis not present

## 2021-08-09 DIAGNOSIS — R0600 Dyspnea, unspecified: Secondary | ICD-10-CM | POA: Insufficient documentation

## 2021-08-09 DIAGNOSIS — R079 Chest pain, unspecified: Secondary | ICD-10-CM | POA: Insufficient documentation

## 2021-08-09 DIAGNOSIS — F419 Anxiety disorder, unspecified: Secondary | ICD-10-CM | POA: Insufficient documentation

## 2021-08-09 LAB — ECHOCARDIOGRAM COMPLETE
AR max vel: 2.54 cm2
AV Peak grad: 6.5 mmHg
Ao pk vel: 1.28 m/s
Area-P 1/2: 3.56 cm2
S' Lateral: 1.8 cm

## 2021-08-09 MED ORDER — AMLODIPINE BESYLATE 5 MG PO TABS
2.5000 mg | ORAL_TABLET | Freq: Every day | ORAL | 3 refills | Status: DC
Start: 2021-08-09 — End: 2021-11-07

## 2021-08-09 NOTE — Assessment & Plan Note (Signed)
Atypical, etiology unclear, assoc also with general weakness, ecg reviewed, pt requests cardiology referral

## 2021-08-09 NOTE — Assessment & Plan Note (Addendum)
BP Readings from Last 3 Encounters:  08/08/21 140/70  08/07/21 124/68  07/24/21 (!) 144/73   Stable, pt to continue medical treatment norvasc 2.5 mg qd - taking HALF of prescribed 5 mg

## 2021-08-09 NOTE — Assessment & Plan Note (Signed)
At least mild to mod situational, declines change in tx at this time, cont ativan prn

## 2021-08-09 NOTE — Assessment & Plan Note (Signed)
Lab Results  Component Value Date   HGBA1C 6.2 (A) 08/07/2021   Stable, cont current med tx - diet, wt control, excercise

## 2021-08-09 NOTE — Assessment & Plan Note (Addendum)
Etiology unclear, maybe multifactorial with age, wt , deconditioning, anxiety, anemia and recent cp of unclear etiology, for cxr  Lab Results  Component Value Date   WBC 5.4 07/24/2021   HGB 9.9 (L) 07/24/2021   HCT 30.1 (L) 07/24/2021   MCV 92.9 07/24/2021   PLT 171 07/24/2021

## 2021-08-09 NOTE — Assessment & Plan Note (Signed)
Uncontrolled mod worsening, for protonix 40 mg qd

## 2021-08-14 ENCOUNTER — Inpatient Hospital Stay (HOSPITAL_BASED_OUTPATIENT_CLINIC_OR_DEPARTMENT_OTHER): Payer: Medicare Other | Admitting: Hematology

## 2021-08-14 ENCOUNTER — Inpatient Hospital Stay: Payer: Medicare Other

## 2021-08-14 ENCOUNTER — Inpatient Hospital Stay: Payer: Medicare Other | Attending: Hematology

## 2021-08-14 VITALS — BP 142/52 | HR 85 | Temp 98.7°F | Resp 20

## 2021-08-14 DIAGNOSIS — I10 Essential (primary) hypertension: Secondary | ICD-10-CM | POA: Insufficient documentation

## 2021-08-14 DIAGNOSIS — C569 Malignant neoplasm of unspecified ovary: Secondary | ICD-10-CM

## 2021-08-14 DIAGNOSIS — Z5111 Encounter for antineoplastic chemotherapy: Secondary | ICD-10-CM | POA: Diagnosis not present

## 2021-08-14 DIAGNOSIS — K5900 Constipation, unspecified: Secondary | ICD-10-CM | POA: Diagnosis not present

## 2021-08-14 DIAGNOSIS — C563 Malignant neoplasm of bilateral ovaries: Secondary | ICD-10-CM

## 2021-08-14 DIAGNOSIS — Z95828 Presence of other vascular implants and grafts: Secondary | ICD-10-CM

## 2021-08-14 LAB — COMPREHENSIVE METABOLIC PANEL
ALT: 12 U/L (ref 0–44)
AST: 17 U/L (ref 15–41)
Albumin: 4.1 g/dL (ref 3.5–5.0)
Alkaline Phosphatase: 60 U/L (ref 38–126)
Anion gap: 7 (ref 5–15)
BUN: 30 mg/dL — ABNORMAL HIGH (ref 8–23)
CO2: 22 mmol/L (ref 22–32)
Calcium: 9.6 mg/dL (ref 8.9–10.3)
Chloride: 110 mmol/L (ref 98–111)
Creatinine, Ser: 1.16 mg/dL — ABNORMAL HIGH (ref 0.44–1.00)
GFR, Estimated: 49 mL/min — ABNORMAL LOW (ref >60)
Glucose, Bld: 117 mg/dL — ABNORMAL HIGH (ref 70–99)
Potassium: 3.9 mmol/L (ref 3.5–5.1)
Sodium: 139 mmol/L (ref 135–145)
Total Bilirubin: 0.5 mg/dL (ref 0.3–1.2)
Total Protein: 6.9 g/dL (ref 6.5–8.1)

## 2021-08-14 LAB — CBC WITH DIFFERENTIAL/PLATELET
Abs Immature Granulocytes: 0.02 10*3/uL (ref 0.00–0.07)
Basophils Absolute: 0 10*3/uL (ref 0.0–0.1)
Basophils Relative: 0 %
Eosinophils Absolute: 0 10*3/uL (ref 0.0–0.5)
Eosinophils Relative: 1 %
HCT: 29.3 % — ABNORMAL LOW (ref 36.0–46.0)
Hemoglobin: 9.5 g/dL — ABNORMAL LOW (ref 12.0–15.0)
Immature Granulocytes: 0 %
Lymphocytes Relative: 18 %
Lymphs Abs: 0.9 10*3/uL (ref 0.7–4.0)
MCH: 30.5 pg (ref 26.0–34.0)
MCHC: 32.4 g/dL (ref 30.0–36.0)
MCV: 94.2 fL (ref 80.0–100.0)
Monocytes Absolute: 0.5 10*3/uL (ref 0.1–1.0)
Monocytes Relative: 9 %
Neutro Abs: 3.6 10*3/uL (ref 1.7–7.7)
Neutrophils Relative %: 72 %
Platelets: 154 10*3/uL (ref 150–400)
RBC: 3.11 MIL/uL — ABNORMAL LOW (ref 3.87–5.11)
RDW: 15.7 % — ABNORMAL HIGH (ref 11.5–15.5)
WBC: 5 10*3/uL (ref 4.0–10.5)
nRBC: 0 % (ref 0.0–0.2)

## 2021-08-14 LAB — MAGNESIUM: Magnesium: 1.9 mg/dL (ref 1.7–2.4)

## 2021-08-14 MED ORDER — DIPHENHYDRAMINE HCL 50 MG/ML IJ SOLN
25.0000 mg | Freq: Once | INTRAMUSCULAR | Status: AC
  Administered 2021-08-14: 25 mg via INTRAVENOUS
  Filled 2021-08-14: qty 1

## 2021-08-14 MED ORDER — FAMOTIDINE IN NACL 20-0.9 MG/50ML-% IV SOLN
20.0000 mg | Freq: Once | INTRAVENOUS | Status: AC
  Administered 2021-08-14: 20 mg via INTRAVENOUS
  Filled 2021-08-14: qty 50

## 2021-08-14 MED ORDER — SODIUM CHLORIDE 0.9 % IV SOLN: Freq: Once | INTRAVENOUS | Status: AC

## 2021-08-14 MED ORDER — PEGFILGRASTIM 6 MG/0.6ML ~~LOC~~ PSKT
6.0000 mg | PREFILLED_SYRINGE | Freq: Once | SUBCUTANEOUS | Status: AC
  Administered 2021-08-14: 6 mg via SUBCUTANEOUS
  Filled 2021-08-14: qty 0.6

## 2021-08-14 MED ORDER — PALONOSETRON HCL INJECTION 0.25 MG/5ML
0.2500 mg | Freq: Once | INTRAVENOUS | Status: AC
  Administered 2021-08-14: 0.25 mg via INTRAVENOUS
  Filled 2021-08-14: qty 5

## 2021-08-14 MED ORDER — SODIUM CHLORIDE 0.9 % IV SOLN
10.0000 mg | Freq: Once | INTRAVENOUS | Status: AC
  Administered 2021-08-14: 10 mg via INTRAVENOUS
  Filled 2021-08-14: qty 10

## 2021-08-14 MED ORDER — SODIUM CHLORIDE 0.9 % IV SOLN
416.5000 mg | Freq: Once | INTRAVENOUS | Status: AC
  Administered 2021-08-14: 420 mg via INTRAVENOUS
  Filled 2021-08-14: qty 42

## 2021-08-14 MED ORDER — SODIUM CHLORIDE 0.9 % IV SOLN
150.0000 mg | Freq: Once | INTRAVENOUS | Status: AC
  Administered 2021-08-14: 150 mg via INTRAVENOUS
  Filled 2021-08-14: qty 150

## 2021-08-14 MED ORDER — SODIUM CHLORIDE 0.9 % IV SOLN
140.0000 mg/m2 | Freq: Once | INTRAVENOUS | Status: AC
  Administered 2021-08-14: 282 mg via INTRAVENOUS
  Filled 2021-08-14: qty 47

## 2021-08-14 MED ORDER — HEPARIN SOD (PORK) LOCK FLUSH 100 UNIT/ML IV SOLN
500.0000 [IU] | Freq: Once | INTRAVENOUS | Status: AC | PRN
  Administered 2021-08-14: 500 [IU]

## 2021-08-14 MED ORDER — SODIUM CHLORIDE 0.9% FLUSH
10.0000 mL | INTRAVENOUS | Status: DC | PRN
  Administered 2021-08-14: 10 mL

## 2021-08-14 NOTE — Patient Instructions (Signed)
[facility]  Discharge Instructions: Thank you for choosing [facility] to provide your oncology and hematology care.  If you have a lab appointment with the [facility], please come in thru the Main Entrance and check in at the main information desk.  Wear comfortable clothing and clothing appropriate for easy access to any Portacath or PICC line.   We strive to give you quality time with your provider. You may need to reschedule your appointment if you arrive late (15 or more minutes).  Arriving late affects you and other patients whose appointments are after yours.  Also, if you miss three or more appointments without notifying the office, you may be dismissed from the clinic at the provider's discretion.      For prescription refill requests, have your pharmacy contact our office and allow 72 hours for refills to be completed.    Today you received the following chemotherapy and/or immunotherapy agents Carboplatin taxol and onpro      To help prevent nausea and vomiting after your treatment, we encourage you to take your nausea medication as directed.  BELOW ARE SYMPTOMS THAT SHOULD BE REPORTED IMMEDIATELY: *FEVER GREATER THAN 100.4 F (38 C) OR HIGHER *CHILLS OR SWEATING *NAUSEA AND VOMITING THAT IS NOT CONTROLLED WITH YOUR NAUSEA MEDICATION *UNUSUAL SHORTNESS OF BREATH *UNUSUAL BRUISING OR BLEEDING *URINARY PROBLEMS (pain or burning when urinating, or frequent urination) *BOWEL PROBLEMS (unusual diarrhea, constipation, pain near the anus) TENDERNESS IN MOUTH AND THROAT WITH OR WITHOUT PRESENCE OF ULCERS (sore throat, sores in mouth, or a toothache) UNUSUAL RASH, SWELLING OR PAIN  UNUSUAL VAGINAL DISCHARGE OR ITCHING   Items with * indicate a potential emergency and should be followed up as soon as possible or go to the Emergency Department if any problems should occur.  Please show the CHEMOTHERAPY ALERT CARD or IMMUNOTHERAPY ALERT CARD at  check-in to the Emergency Department and triage nurse.  Should you have questions after your visit or need to cancel or reschedule your appointment, please contact [facility] [phone]  and follow the prompts.  Office hours are 8:00 a.m. to 4:30 p.m. Monday - Friday. Please note that voicemails left after 4:00 p.m. may not be returned until the following business day.  We are closed weekends and major holidays. You have access to a nurse at all times for urgent questions. Please call the main number to the clinic [phone] and follow the prompts.  For any non-urgent questions, you may also contact your provider using MyChart. We now offer e-Visits for anyone 33 and older to request care online for non-urgent symptoms. For details visit [email or web address].   Also download the MyChart app! Go to the app store, search "MyChart", open the app, select [facility], and log in with your MyChart username and password.  Masks are optional in the cancer centers. If you would like for your care team to wear a mask while they are taking care of you, please let them know. For doctor visits, patients may have with them one support person who is at least 77 years old. At this time, visitors are not allowed in the infusion area.

## 2021-08-14 NOTE — Progress Notes (Signed)
Patients port flushed without difficulty.  Good blood return noted with no bruising or swelling noted at site.  Patient remains accessed for chemotherapy treatment.  

## 2021-08-14 NOTE — Addendum Note (Signed)
Addended by: Terence Lux A on: 08/14/2021 03:02 PM   Modules accepted: Orders

## 2021-08-14 NOTE — Progress Notes (Signed)
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 - Reviewed labs today which showed creatinine improved to 1.16.  LFTs are normal.  CBC was grossly normal with hemoglobin 9.5. - She will proceed with cycle 5 today.  RTC 3 weeks for follow-up.  We will check CA125.  We will repeat CT scan after cycle 6.    2.  Hypertension: - Continue amlodipine daily.  Irbesartan on hold.  Blood pressure is 150/66.  3.  Constipation: - Continue Pepto-Bismol and Metamucil daily.   Orders placed this encounter:  No orders of the defined types were placed in this encounter.    Derek Jack, MD [place] [phone]   I, Thana Ates, am acting as a scribe for Dr. Derek Jack.  I, Derek Jack MD, have reviewed the above documentation for accuracy and completeness, and I agree with the above.

## 2021-08-14 NOTE — Progress Notes (Signed)
Patient presents today for Taxol and Carboplatin infusion per providers order.  Vital signs and labs within parameters for treatment.  Patient has no new complaints at time.  Taxol and Carboplatin given today per MD orders.  Stable during infusion without adverse affects.  Vital signs stable.  On Pro connected and blinking green.  No complaints at this time.  Discharge from clinic ambulatory in stable condition.  Alert and oriented X 3.  Follow up with [facility] as scheduled.

## 2021-08-14 NOTE — Patient Instructions (Addendum)
[place] at [facility] Discharge Instructions   You were seen and examined today by Dr. Delton Coombes.  He reviewed the results of your lab work which is normal/stable.   We will proceed with your treatment today.   Return as scheduled in 3 weeks.    Thank you for choosing [place] at [facility] to provide your oncology and hematology care.  To afford each patient quality time with our provider, please arrive at least 15 minutes before your scheduled appointment time.   If you have a lab appointment with the [facility] please come in thru the Main Entrance and check in at the main information desk.  You need to re-schedule your appointment should you arrive 10 or more minutes late.  We strive to give you quality time with our providers, and arriving late affects you and other patients whose appointments are after yours.  Also, if you no show three or more times for appointments you may be dismissed from the clinic at the providers discretion.     Again, thank you for choosing [facility].  Our hope is that these requests will decrease the amount of time that you wait before being seen by our physicians.       _____________________________________________________________  Should you have questions after your visit to [facility], please contact our office at [phone] and follow the prompts.  Our office hours are 8:00 a.m. and 4:30 p.m. Monday - Friday.  Please note that voicemails left after 4:00 p.m. may not be returned until the following business day.  We are closed weekends and major holidays.  You do have access to a nurse 24-7, just call the main number to the clinic [phone] and do not press any options, hold on the line and a nurse will answer the phone.    For prescription refill requests, have your pharmacy contact our office and allow 72 hours.    Due to Covid, you will need to wear a mask upon  entering the hospital. If you do not have a mask, a mask will be given to you at the Main Entrance upon arrival. For doctor visits, patients may have 1 support person age 29 or older with them. For treatment visits, patients can not have anyone with them due to social distancing guidelines and our immunocompromised population.

## 2021-08-15 ENCOUNTER — Ambulatory Visit (INDEPENDENT_AMBULATORY_CARE_PROVIDER_SITE_OTHER): Payer: Medicare Other | Admitting: Podiatry

## 2021-08-15 ENCOUNTER — Encounter: Payer: Self-pay | Admitting: Podiatry

## 2021-08-15 DIAGNOSIS — L600 Ingrowing nail: Secondary | ICD-10-CM

## 2021-08-16 ENCOUNTER — Ambulatory Visit (HOSPITAL_COMMUNITY): Payer: Medicare Other

## 2021-08-17 LAB — CA 125: Cancer Antigen (CA) 125: 11.3 U/mL (ref 0.0–38.1)

## 2021-08-17 NOTE — Progress Notes (Signed)
Subjective:   Patient ID: Laura [name], female   DOB: 76 y.o.   MRN: [id number]   HPI Patient states that her right big toenail is getting loose and she is concerned because she is a diabetic with history of injury in May at the time that became infected she was on antibiotics that healed but the nail is structurally not sound.  Patient does not smoke and has her diabetes under good control   Review of Systems  All other systems reviewed and are negative.       Objective:  Physical Exam Vitals and nursing note reviewed.  Constitutional:      Appearance: She is well-developed.  Pulmonary:     Effort: Pulmonary effort is normal.  Musculoskeletal:        General: Normal range of motion.  Skin:    General: Skin is warm.  Neurological:     Mental Status: She is alert.     Neurovascular status was found to be intact muscle strength is found to be adequate range of motion within normal limits no ulcerations noted a loose right hallux nail bed is noted no erythema edema or drainage associated with it.  Good digital perfusion well oriented     Assessment:  Damaged right hallux nail bed that is partially loose along with long-term diabetes good control no indications of foot manifestations currently     Plan:  H&P reviewed diabetic care and daily inspections of her feet anesthetized the right hallux and under sterile technique with sterile instrumentation remove the hallux nail did not note any drainage did not note any pathology flushed applied sterile dressing instructed on soaks and we will allow it to regrow with patient to come in if any issues were to occur

## 2021-08-26 ENCOUNTER — Other Ambulatory Visit (HOSPITAL_BASED_OUTPATIENT_CLINIC_OR_DEPARTMENT_OTHER): Payer: Self-pay

## 2021-09-04 ENCOUNTER — Inpatient Hospital Stay: Payer: Medicare Other

## 2021-09-04 ENCOUNTER — Inpatient Hospital Stay (HOSPITAL_BASED_OUTPATIENT_CLINIC_OR_DEPARTMENT_OTHER): Payer: Medicare Other | Admitting: Hematology

## 2021-09-04 VITALS — BP 152/71 | HR 91 | Temp 98.0°F

## 2021-09-04 VITALS — BP 150/68 | HR 85 | Temp 98.1°F | Resp 19 | Ht 63.0 in | Wt 186.1 lb

## 2021-09-04 DIAGNOSIS — Z95828 Presence of other vascular implants and grafts: Secondary | ICD-10-CM

## 2021-09-04 DIAGNOSIS — C569 Malignant neoplasm of unspecified ovary: Secondary | ICD-10-CM

## 2021-09-04 DIAGNOSIS — Z5111 Encounter for antineoplastic chemotherapy: Secondary | ICD-10-CM | POA: Diagnosis not present

## 2021-09-04 DIAGNOSIS — K5900 Constipation, unspecified: Secondary | ICD-10-CM | POA: Diagnosis not present

## 2021-09-04 DIAGNOSIS — C563 Malignant neoplasm of bilateral ovaries: Secondary | ICD-10-CM

## 2021-09-04 DIAGNOSIS — I10 Essential (primary) hypertension: Secondary | ICD-10-CM | POA: Diagnosis not present

## 2021-09-04 LAB — CBC WITH DIFFERENTIAL/PLATELET
Abs Immature Granulocytes: 0.02 10*3/uL (ref 0.00–0.07)
Basophils Absolute: 0 10*3/uL (ref 0.0–0.1)
Basophils Relative: 0 %
Eosinophils Absolute: 0 10*3/uL (ref 0.0–0.5)
Eosinophils Relative: 1 %
HCT: 29.3 % — ABNORMAL LOW (ref 36.0–46.0)
Hemoglobin: 9.6 g/dL — ABNORMAL LOW (ref 12.0–15.0)
Immature Granulocytes: 0 %
Lymphocytes Relative: 25 %
Lymphs Abs: 1.4 10*3/uL (ref 0.7–4.0)
MCH: 31.1 pg (ref 26.0–34.0)
MCHC: 32.8 g/dL (ref 30.0–36.0)
MCV: 94.8 fL (ref 80.0–100.0)
Monocytes Absolute: 0.6 10*3/uL (ref 0.1–1.0)
Monocytes Relative: 11 %
Neutro Abs: 3.4 10*3/uL (ref 1.7–7.7)
Neutrophils Relative %: 63 %
Platelets: 114 10*3/uL — ABNORMAL LOW (ref 150–400)
RBC: 3.09 MIL/uL — ABNORMAL LOW (ref 3.87–5.11)
RDW: 14.7 % (ref 11.5–15.5)
WBC: 5.4 10*3/uL (ref 4.0–10.5)
nRBC: 0 % (ref 0.0–0.2)

## 2021-09-04 LAB — COMPREHENSIVE METABOLIC PANEL
ALT: 11 U/L (ref 0–44)
AST: 17 U/L (ref 15–41)
Albumin: 4.1 g/dL (ref 3.5–5.0)
Alkaline Phosphatase: 71 U/L (ref 38–126)
Anion gap: 6 (ref 5–15)
BUN: 28 mg/dL — ABNORMAL HIGH (ref 8–23)
CO2: 22 mmol/L (ref 22–32)
Calcium: 9.9 mg/dL (ref 8.9–10.3)
Chloride: 110 mmol/L (ref 98–111)
Creatinine, Ser: 1.22 mg/dL — ABNORMAL HIGH (ref 0.44–1.00)
GFR, Estimated: 46 mL/min — ABNORMAL LOW (ref >60)
Glucose, Bld: 146 mg/dL — ABNORMAL HIGH (ref 70–99)
Potassium: 4.1 mmol/L (ref 3.5–5.1)
Sodium: 138 mmol/L (ref 135–145)
Total Bilirubin: 0.5 mg/dL (ref 0.3–1.2)
Total Protein: 7.2 g/dL (ref 6.5–8.1)

## 2021-09-04 LAB — MAGNESIUM: Magnesium: 2 mg/dL (ref 1.7–2.4)

## 2021-09-04 MED ORDER — SODIUM CHLORIDE 0.9 % IV SOLN
10.0000 mg | Freq: Once | INTRAVENOUS | Status: AC
  Administered 2021-09-04: 10 mg via INTRAVENOUS
  Filled 2021-09-04: qty 10

## 2021-09-04 MED ORDER — DIPHENHYDRAMINE HCL 50 MG/ML IJ SOLN
25.0000 mg | Freq: Once | INTRAMUSCULAR | Status: AC
  Administered 2021-09-04: 25 mg via INTRAVENOUS
  Filled 2021-09-04: qty 1

## 2021-09-04 MED ORDER — FAMOTIDINE IN NACL 20-0.9 MG/50ML-% IV SOLN
20.0000 mg | Freq: Once | INTRAVENOUS | Status: AC
  Administered 2021-09-04: 20 mg via INTRAVENOUS
  Filled 2021-09-04: qty 50

## 2021-09-04 MED ORDER — PEGFILGRASTIM 6 MG/0.6ML ~~LOC~~ PSKT
6.0000 mg | PREFILLED_SYRINGE | Freq: Once | SUBCUTANEOUS | Status: AC
  Administered 2021-09-04: 6 mg via SUBCUTANEOUS
  Filled 2021-09-04: qty 0.6

## 2021-09-04 MED ORDER — SODIUM CHLORIDE 0.9% FLUSH
10.0000 mL | INTRAVENOUS | Status: DC | PRN
  Administered 2021-09-04: 10 mL

## 2021-09-04 MED ORDER — SODIUM CHLORIDE 0.9 % IV SOLN
140.0000 mg/m2 | Freq: Once | INTRAVENOUS | Status: AC
  Administered 2021-09-04: 282 mg via INTRAVENOUS
  Filled 2021-09-04: qty 47

## 2021-09-04 MED ORDER — SODIUM CHLORIDE 0.9 % IV SOLN: Freq: Once | INTRAVENOUS | Status: AC

## 2021-09-04 MED ORDER — SODIUM CHLORIDE 0.9 % IV SOLN
402.0000 mg | Freq: Once | INTRAVENOUS | Status: AC
  Administered 2021-09-04: 400 mg via INTRAVENOUS
  Filled 2021-09-04: qty 40

## 2021-09-04 MED ORDER — SODIUM CHLORIDE 0.9 % IV SOLN
150.0000 mg | Freq: Once | INTRAVENOUS | Status: AC
  Administered 2021-09-04: 150 mg via INTRAVENOUS
  Filled 2021-09-04: qty 5

## 2021-09-04 MED ORDER — HEPARIN SOD (PORK) LOCK FLUSH 100 UNIT/ML IV SOLN
500.0000 [IU] | Freq: Once | INTRAVENOUS | Status: AC | PRN
  Administered 2021-09-04: 500 [IU]

## 2021-09-04 MED ORDER — PALONOSETRON HCL INJECTION 0.25 MG/5ML
0.2500 mg | Freq: Once | INTRAVENOUS | Status: AC
  Administered 2021-09-04: 0.25 mg via INTRAVENOUS
  Filled 2021-09-04: qty 5

## 2021-09-04 NOTE — Progress Notes (Signed)
Patient has been examined by Dr. Katragadda, and vital signs and labs have been reviewed. ANC, Creatinine, LFTs, hemoglobin, and platelets are within treatment parameters per M.D. - pt may proceed with treatment.  Primary RN and pharmacy notified.  

## 2021-09-04 NOTE — Progress Notes (Signed)
Patient presents today for chemotherapy treatment.  Patient is in satisfactory condition with no complaints voiced.  Vital signs are stable.  Labs reviewed by Dr. Delton Coombes during her office visit.  We will proceed with treatment per MD orders.   Patient tolerated treatment well with no complaints voiced.  Neulasta OnPro administered to R arm.  Patient left via wheelchair in stable condition.  Vital signs stable at discharge.  Follow up as scheduled.
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
## 2021-09-04 NOTE — Patient Instructions (Signed)
[facility]  Discharge Instructions: Thank you for choosing [facility] to provide your oncology and hematology care.  If you have a lab appointment with the [facility], please come in thru the Main Entrance and check in at the main information desk.  Wear comfortable clothing and clothing appropriate for easy access to any Portacath or PICC line.   We strive to give you quality time with your provider. You may need to reschedule your appointment if you arrive late (15 or more minutes).  Arriving late affects you and other patients whose appointments are after yours.  Also, if you miss three or more appointments without notifying the office, you may be dismissed from the clinic at the provider's discretion.      For prescription refill requests, have your pharmacy contact our office and allow 72 hours for refills to be completed.    Today you received the following chemotherapy and/or immunotherapy agents Taxol/Carbo.   Pegfilgrastim Injection What is this medication? PEGFILGRASTIM (PEG fil gra stim) lowers the risk of infection in people who are receiving chemotherapy. It works by Building control surveyor make more white blood cells, which protects your body from infection. It may also be used to help people who have been exposed to high doses of radiation. This medicine may be used for other purposes; ask your health care provider or pharmacist if you have questions. COMMON BRAND NAME(S): [place], Neulasta, Nyvepria, Stimufend, UDENYCA, Ziextenzo What should I tell my care team before I take this medication? They need to know if you have any of these conditions: Kidney disease Latex allergy Ongoing radiation therapy Sickle cell disease Skin reactions to acrylic adhesives (On-Body Injector only) An unusual or allergic reaction to pegfilgrastim, filgrastim, other medications, foods, dyes, or preservatives Pregnant or trying to get  pregnant Breast-feeding How should I use this medication? This medication is for injection under the skin. If you get this medication at home, you will be taught how to prepare and give the pre-filled syringe or how to use the On-body Injector. Refer to the patient Instructions for Use for detailed instructions. Use exactly as directed. Tell your care team immediately if you suspect that the On-body Injector may not have performed as intended or if you suspect the use of the On-body Injector resulted in a missed or partial dose. It is important that you put your used needles and syringes in a special sharps container. Do not put them in a trash can. If you do not have a sharps container, call your pharmacist or care team to get one. Talk to your care team about the use of this medication in children. While this medication may be prescribed for selected conditions, precautions do apply. Overdosage: If you think you have taken too much of this medicine contact a poison control center or emergency room at once. NOTE: This medicine is only for you. Do not share this medicine with others. What if I miss a dose? It is important not to miss your dose. Call your care team if you miss your dose. If you miss a dose due to an On-body Injector failure or leakage, a new dose should be administered as soon as possible using a single prefilled syringe for manual use. What may interact with this medication? Interactions have not been studied. This list may not describe all possible interactions. Give your health care provider a list of all the medicines, herbs, non-prescription drugs, or dietary supplements you use. Also  tell them if you smoke, drink alcohol, or use illegal drugs. Some items may interact with your medicine. What should I watch for while using this medication? Your condition will be monitored carefully while you are receiving this medication. You may need blood work done while you are taking this  medication. Talk to your care team about your risk of cancer. You may be more at risk for certain types of cancer if you take this medication. If you are going to need a MRI, CT scan, or other procedure, tell your care team that you are using this medication (On-Body Injector only). What side effects may I notice from receiving this medication? Side effects that you should report to your care team as soon as possible: Allergic reactions--skin rash, itching, hives, swelling of the face, lips, tongue, or throat Capillary leak syndrome--stomach or muscle pain, unusual weakness or fatigue, feeling faint or lightheaded, decrease in the amount of urine, swelling of the ankles, hands, or feet, trouble breathing High white blood cell level--fever, fatigue, trouble breathing, night sweats, change in vision, weight loss Inflammation of the aorta--fever, fatigue, back, chest, or stomach pain, severe headache Kidney injury (glomerulonephritis)--decrease in the amount of urine, red or dark Shann Lewellyn urine, foamy or bubbly urine, swelling of the ankles, hands, or feet Shortness of breath or trouble breathing Spleen injury--pain in upper left stomach or shoulder Unusual bruising or bleeding Side effects that usually do not require medical attention (report to your care team if they continue or are bothersome): Bone pain Pain in the hands or feet This list may not describe all possible side effects. Call your doctor for medical advice about side effects. You may report side effects to FDA at 1-800-FDA-1088. Where should I keep my medication? Keep out of the reach of children. If you are using this medication at home, you will be instructed on how to store it. Throw away any unused medication after the expiration date on the label. NOTE: This sheet is a summary. It may not cover all possible information. If you have questions about this medicine, talk to your doctor, pharmacist, or health care provider.  2023  Elsevier/Gold Standard (2013-04-01 00:00:00)   Carboplatin Injection What is this medication? CARBOPLATIN (KAR boe pla tin) treats some types of cancer. It works by slowing down the growth of cancer cells. This medicine may be used for other purposes; ask your health care provider or pharmacist if you have questions. COMMON BRAND NAME(S): Paraplatin What should I tell my care team before I take this medication? They need to know if you have any of these conditions: Blood disorders Hearing problems Kidney disease Recent or ongoing radiation therapy An unusual or allergic reaction to carboplatin, cisplatin, other medications, foods, dyes, or preservatives Pregnant or trying to get pregnant Breast-feeding How should I use this medication? This medication is injected into a vein. It is given by your care team in a hospital or clinic setting. Talk to your care team about the use of this medication in children. Special care may be needed. Overdosage: If you think you have taken too much of this medicine contact a poison control center or emergency room at once. NOTE: This medicine is only for you. Do not share this medicine with others. What if I miss a dose? Keep appointments for follow-up doses. It is important not to miss your dose. Call your care team if you are unable to keep an appointment. What may interact with this medication? Medications for seizures Some antibiotics,  such as amikacin, gentamicin, neomycin, streptomycin, tobramycin Vaccines This list may not describe all possible interactions. Give your health care provider a list of all the medicines, herbs, non-prescription drugs, or dietary supplements you use. Also tell them if you smoke, drink alcohol, or use illegal drugs. Some items may interact with your medicine. What should I watch for while using this medication? Your condition will be monitored carefully while you are receiving this medication. You may need blood work  while taking this medication. This medication may make you feel generally unwell. This is not uncommon, as chemotherapy can affect healthy cells as well as cancer cells. Report any side effects. Continue your course of treatment even though you feel ill unless your care team tells you to stop. In some cases, you may be given additional medications to help with side effects. Follow all directions for their use. This medication may increase your risk of getting an infection. Call your care team for advice if you get a fever, chills, sore throat, or other symptoms of a cold or flu. Do not treat yourself. Try to avoid being around people who are sick. Avoid taking medications that contain aspirin, acetaminophen, ibuprofen, naproxen, or ketoprofen unless instructed by your care team. These medications may hide a fever. Be careful brushing or flossing your teeth or using a toothpick because you may get an infection or bleed more easily. If you have any dental work done, tell your dentist you are receiving this medication. Talk to your care team if you wish to become pregnant or think you might be pregnant. This medication can cause serious birth defects. Talk to your care team about effective forms of contraception. Do not breast-feed while taking this medication. What side effects may I notice from receiving this medication? Side effects that you should report to your care team as soon as possible: Allergic reactions--skin rash, itching, hives, swelling of the face, lips, tongue, or throat Infection--fever, chills, cough, sore throat, wounds that don't heal, pain or trouble when passing urine, general feeling of discomfort or being unwell Low red blood cell level--unusual weakness or fatigue, dizziness, headache, trouble breathing Pain, tingling, or numbness in the hands or feet, muscle weakness, change in vision, confusion or trouble speaking, loss of balance or coordination, trouble walking,  seizures Unusual bruising or bleeding Side effects that usually do not require medical attention (report to your care team if they continue or are bothersome): Hair loss Nausea Unusual weakness or fatigue Vomiting This list may not describe all possible side effects. Call your doctor for medical advice about side effects. You may report side effects to FDA at 1-800-FDA-1088. Where should I keep my medication? This medication is given in a hospital or clinic. It will not be stored at home. NOTE: This sheet is a summary. It may not cover all possible information. If you have questions about this medicine, talk to your doctor, pharmacist, or health care provider.  2023 Elsevier/Gold Standard (2021-04-23 00:00:00)   Paclitaxel Injection What is this medication? PACLITAXEL (PAK li TAX el) treats some types of cancer. It works by slowing down the growth of cancer cells. This medicine may be used for other purposes; ask your health care provider or pharmacist if you have questions. COMMON BRAND NAME(S): Onxol, Taxol What should I tell my care team before I take this medication? They need to know if you have any of these conditions: Heart disease Liver disease Low white blood cell levels An unusual or allergic reaction to paclitaxel,  other medications, foods, dyes, or preservatives If you or your partner are pregnant or trying to get pregnant Breast-feeding How should I use this medication? This medication is injected into a vein. It is given by your care team in a hospital or clinic setting. Talk to your care team about the use of this medication in children. While it may be given to children for selected conditions, precautions do apply. Overdosage: If you think you have taken too much of this medicine contact a poison control center or emergency room at once. NOTE: This medicine is only for you. Do not share this medicine with others. What if I miss a dose? Keep appointments for  follow-up doses. It is important not to miss your dose. Call your care team if you are unable to keep an appointment. What may interact with this medication? Do not take this medication with any of the following: Live virus vaccines Other medications may affect the way this medication works. Talk with your care team about all of the medications you take. They may suggest changes to your treatment plan to lower the risk of side effects and to make sure your medications work as intended. This list may not describe all possible interactions. Give your health care provider a list of all the medicines, herbs, non-prescription drugs, or dietary supplements you use. Also tell them if you smoke, drink alcohol, or use illegal drugs. Some items may interact with your medicine. What should I watch for while using this medication? Your condition will be monitored carefully while you are receiving this medication. You may need blood work while taking this medication. This medication may make you feel generally unwell. This is not uncommon as chemotherapy can affect healthy cells as well as cancer cells. Report any side effects. Continue your course of treatment even though you feel ill unless your care team tells you to stop. This medication can cause serious allergic reactions. To reduce the risk, your care team may give you other medications to take before receiving this one. Be sure to follow the directions from your care team. This medication may increase your risk of getting an infection. Call your care team for advice if you get a fever, chills, sore throat, or other symptoms of a cold or flu. Do not treat yourself. Try to avoid being around people who are sick. This medication may increase your risk to bruise or bleed. Call your care team if you notice any unusual bleeding. Be careful brushing or flossing your teeth or using a toothpick because you may get an infection or bleed more easily. If you have any  dental work done, tell your dentist you are receiving this medication. Talk to your care team if you may be pregnant. Serious birth defects can occur if you take this medication during pregnancy. Talk to your care team before breastfeeding. Changes to your treatment plan may be needed. What side effects may I notice from receiving this medication? Side effects that you should report to your care team as soon as possible: Allergic reactions--skin rash, itching, hives, swelling of the face, lips, tongue, or throat Heart rhythm changes--fast or irregular heartbeat, dizziness, feeling faint or lightheaded, chest pain, trouble breathing Increase in blood pressure Infection--fever, chills, cough, sore throat, wounds that don't heal, pain or trouble when passing urine, general feeling of discomfort or being unwell Low blood pressure--dizziness, feeling faint or lightheaded, blurry vision Low red blood cell level--unusual weakness or fatigue, dizziness, headache, trouble breathing Painful swelling, warmth,  or redness of the skin, blisters or sores at the infusion site Pain, tingling, or numbness in the hands or feet Slow heartbeat--dizziness, feeling faint or lightheaded, confusion, trouble breathing, unusual weakness or fatigue Unusual bruising or bleeding Side effects that usually do not require medical attention (report to your care team if they continue or are bothersome): Diarrhea Hair loss Joint pain Loss of appetite Muscle pain Nausea Vomiting This list may not describe all possible side effects. Call your doctor for medical advice about side effects. You may report side effects to FDA at 1-800-FDA-1088. Where should I keep my medication? This medication is given in a hospital or clinic. It will not be stored at home. NOTE: This sheet is a summary. It may not cover all possible information. If you have questions about this medicine, talk to your doctor, pharmacist, or health care  provider.  2023 Elsevier/Gold Standard (2021-05-16 00:00:00)        To help prevent nausea and vomiting after your treatment, we encourage you to take your nausea medication as directed.  BELOW ARE SYMPTOMS THAT SHOULD BE REPORTED IMMEDIATELY: *FEVER GREATER THAN 100.4 F (38 C) OR HIGHER *CHILLS OR SWEATING *NAUSEA AND VOMITING THAT IS NOT CONTROLLED WITH YOUR NAUSEA MEDICATION *UNUSUAL SHORTNESS OF BREATH *UNUSUAL BRUISING OR BLEEDING *URINARY PROBLEMS (pain or burning when urinating, or frequent urination) *BOWEL PROBLEMS (unusual diarrhea, constipation, pain near the anus) TENDERNESS IN MOUTH AND THROAT WITH OR WITHOUT PRESENCE OF ULCERS (sore throat, sores in mouth, or a toothache) UNUSUAL RASH, SWELLING OR PAIN  UNUSUAL VAGINAL DISCHARGE OR ITCHING   Items with * indicate a potential emergency and should be followed up as soon as possible or go to the Emergency Department if any problems should occur.  Please show the CHEMOTHERAPY ALERT CARD or IMMUNOTHERAPY ALERT CARD at check-in to the Emergency Department and triage nurse.  Should you have questions after your visit or need to cancel or reschedule your appointment, please contact [facility] [phone]  and follow the prompts.  Office hours are 8:00 a.m. to 4:30 p.m. Monday - Friday. Please note that voicemails left after 4:00 p.m. may not be returned until the following business day.  We are closed weekends and major holidays. You have access to a nurse at all times for urgent questions. Please call the main number to the clinic [phone] and follow the prompts.  For any non-urgent questions, you may also contact your provider using MyChart. We now offer e-Visits for anyone 23 and older to request care online for non-urgent symptoms. For details visit [email or web address].   Also download the MyChart app! Go to the app store, search "MyChart", open the app, select [facility], and log in with your  MyChart username and password.  Masks are optional in the cancer centers. If you would like for your care team to wear a mask while they are taking care of you, please let them know. You may have one support person who is at least 77 years old accompany you for your appointments.

## 2021-09-04 NOTE — Patient Instructions (Signed)
[facility] at [facility] Discharge Instructions   You were seen and examined today by Dr. Delton Coombes.  He reviewed the results of your lab work which are normal/stable.   We will proceed with your treatment today.  We will repeat a scan prior to your next treatment.   Return as scheduled.    Thank you for choosing [facility] at [facility] to provide your oncology and hematology care.  To afford each patient quality time with our provider, please arrive at least 15 minutes before your scheduled appointment time.   If you have a lab appointment with the [facility] please come in thru the Main Entrance and check in at the main information desk.  You need to re-schedule your appointment should you arrive 10 or more minutes late.  We strive to give you quality time with our providers, and arriving late affects you and other patients whose appointments are after yours.  Also, if you no show three or more times for appointments you may be dismissed from the clinic at the providers discretion.     Again, thank you for choosing [facility].  Our hope is that these requests will decrease the amount of time that you wait before being seen by our physicians.       _____________________________________________________________  Should you have questions after your visit to [facility], please contact our office at [phone] and follow the prompts.  Our office hours are 8:00 a.m. and 4:30 p.m. Monday - Friday.  Please note that voicemails left after 4:00 p.m. may not be returned until the following business day.  We are closed weekends and major holidays.  You do have access to a nurse 24-7, just call the main number to the clinic [phone] and do not press any options, hold on the line and a nurse will answer the phone.    For prescription refill requests, have your pharmacy contact our office and allow 72 hours.    Due  to Covid, you will need to wear a mask upon entering the hospital. If you do not have a mask, a mask will be given to you at the Main Entrance upon arrival. For doctor visits, patients may have 1 support person age 35 or older with them. For treatment visits, patients can not have anyone with them due to social distancing guidelines and our immunocompromised population.

## 2021-09-05 ENCOUNTER — Ambulatory Visit: Payer: Medicare Other | Admitting: Internal Medicine

## 2021-09-05 ENCOUNTER — Telehealth: Payer: Self-pay | Admitting: Internal Medicine

## 2021-09-05 NOTE — Telephone Encounter (Signed)
Laura [name] called stating her current tests shows she is doing much better.  Laura [name] would like to know if she should have the follow-up with Dr. Margaretann Loveless.

## 2021-09-05 NOTE — Telephone Encounter (Signed)
Laura Munroe, MD  You 29 minutes ago (12:49 PM)    If she is feeling better, ok to cancel. She can call me if she needs me, or set up to see each other in 6-12 months.   Please have her follow her cholesterol and blood pressure with her PCP.  [place]     Called patient and made her aware of the above, patient wishes to follow up as needed. Call back number provided. Patient aware of all instructions and verbalized understanding.

## 2021-09-06 ENCOUNTER — Ambulatory Visit (HOSPITAL_COMMUNITY): Payer: Medicare Other

## 2021-09-06 ENCOUNTER — Telehealth: Payer: Self-pay

## 2021-09-06 DIAGNOSIS — N130 Hydronephrosis with ureteropelvic junction obstruction: Secondary | ICD-10-CM | POA: Diagnosis not present

## 2021-09-06 NOTE — Telephone Encounter (Signed)
Received chat from MD- she noted patient was following as needed per conversation with RN on 09/05/2021. I contacted patient, LVM advising that we would cancel appointment on 09/09/2021, this was not needed. Advised patient to return call if needed. Left call back number.

## 2021-09-09 ENCOUNTER — Other Ambulatory Visit: Payer: Self-pay | Admitting: Urology

## 2021-09-09 ENCOUNTER — Ambulatory Visit: Payer: Medicare Other | Admitting: Internal Medicine

## 2021-09-09 NOTE — Progress Notes (Signed)
Patient called reporting that her urine has changed and seems to be more "bubbly" than normal as well as darker in color. She reports no odor, no pain, and no new frequency. Offered patient appointment for a UA and culture, patient says that her urologist took a UA on 09/06/21 and she will call their office for results of that. Patient declines repeat UA at this time. Encouraged patient to call back with additional questions or concerns.

## 2021-09-11 ENCOUNTER — Other Ambulatory Visit (HOSPITAL_BASED_OUTPATIENT_CLINIC_OR_DEPARTMENT_OTHER): Payer: Self-pay

## 2021-09-11 MED ORDER — CEPHALEXIN 500 MG PO CAPS
500.0000 mg | ORAL_CAPSULE | Freq: Three times a day (TID) | ORAL | 0 refills | Status: DC
  Filled 2021-09-11: qty 21, 7d supply, fill #0

## 2021-09-12 ENCOUNTER — Ambulatory Visit: Payer: Medicare Other | Admitting: Internal Medicine

## 2021-09-12 ENCOUNTER — Encounter (HOSPITAL_BASED_OUTPATIENT_CLINIC_OR_DEPARTMENT_OTHER): Payer: Self-pay | Admitting: Urology

## 2021-09-12 ENCOUNTER — Other Ambulatory Visit (HOSPITAL_BASED_OUTPATIENT_CLINIC_OR_DEPARTMENT_OTHER): Payer: Self-pay

## 2021-09-12 NOTE — Progress Notes (Signed)
Spoke w/ via phone for pre-op interview--- [name] needs dos----   NONE            Lab results------Current EKG in Epic dated 07/2021 COVID test -----patient states asymptomatic no test needed Arrive at -------1030 NPO after MN NO Solid Food.  Clear liquids from MN until---0930 Med rec completed Medications to take morning of surgery ----- Norvasc and Pepcid Diabetic medication ----- No DM medication AM of surgery. Patient instructed no nail polish to be worn day of surgery Patient instructed to bring photo id and insurance card day of surgery Patient aware to have Driver (ride ) / caregiver  Aware she needs driver and caregiver unsure who it will be.  for 24 hours after surgery  Patient Special Instructions ----- Pre-Op special Istructions ----- Patient verbalized understanding of instructions that were given at this phone interview. Patient denies shortness of breath, chest pain, fever, cough at this phone interview.

## 2021-09-13 ENCOUNTER — Other Ambulatory Visit (HOSPITAL_BASED_OUTPATIENT_CLINIC_OR_DEPARTMENT_OTHER): Payer: Self-pay

## 2021-09-13 DIAGNOSIS — H52201 Unspecified astigmatism, right eye: Secondary | ICD-10-CM | POA: Diagnosis not present

## 2021-09-13 DIAGNOSIS — H5203 Hypermetropia, bilateral: Secondary | ICD-10-CM | POA: Diagnosis not present

## 2021-09-13 MED ORDER — AMOXICILLIN 500 MG PO CAPS
500.0000 mg | ORAL_CAPSULE | Freq: Two times a day (BID) | ORAL | 0 refills | Status: DC
  Filled 2021-09-13: qty 14, 7d supply, fill #0

## 2021-09-17 ENCOUNTER — Other Ambulatory Visit (HOSPITAL_COMMUNITY): Payer: Medicare Other

## 2021-09-19 ENCOUNTER — Ambulatory Visit: Payer: Medicare Other | Admitting: Hematology

## 2021-09-25 ENCOUNTER — Ambulatory Visit (HOSPITAL_COMMUNITY)
Admission: RE | Admit: 2021-09-25 | Discharge: 2021-09-25 | Disposition: A | Discharge status: Home / Self Care | Payer: Medicare Other | Source: Ambulatory Visit | Attending: Hematology | Admitting: Hematology

## 2021-09-25 ENCOUNTER — Encounter (HOSPITAL_COMMUNITY): Payer: Self-pay | Admitting: Radiology

## 2021-09-25 DIAGNOSIS — N1330 Unspecified hydronephrosis: Secondary | ICD-10-CM | POA: Diagnosis not present

## 2021-09-25 DIAGNOSIS — I700 Atherosclerosis of aorta: Secondary | ICD-10-CM | POA: Diagnosis not present

## 2021-09-25 DIAGNOSIS — I2510 Atherosclerotic heart disease of native coronary artery without angina pectoris: Secondary | ICD-10-CM | POA: Diagnosis not present

## 2021-09-25 DIAGNOSIS — C569 Malignant neoplasm of unspecified ovary: Secondary | ICD-10-CM | POA: Insufficient documentation

## 2021-09-25 MED ORDER — IOHEXOL 300 MG/ML  SOLN
100.0000 mL | Freq: Once | INTRAMUSCULAR | Status: AC | PRN
  Administered 2021-09-25: 80 mL via INTRAVENOUS

## 2021-09-25 NOTE — H&P (Signed)
CC/HPI: cc: Laura [name], Laura [name]   03/12/21: 76 yo woman last seen by Dr. Karsten Ro in June 2021 for detrusor overactivity referred for Laura [name] and Laura [name]. She has a history of ovarian cancer and is currently undergoing radiation for this. She had a renal ultrasound that showed worsening right Laura [name] with external compression by pelvic lymph node. She is not having any pain. Her GFR has acutely worsened to 20 with a creatinine of 2.3 from 0.88 three months previously.   03/19/21: With ovarian cancer and right Laura [name] due to lymphadenopathy underwent ureteral stent placement for Laura [name] with Laura [name]. Stent was placed last week and she has done well since then. She comes in today because she feels like she had decreased urine output. She has no pain and no hematuria. She did take Vesicare Friday Saturday and Sunday but then stopped when she felt her urine output was less. Her PVR in the office today is approximately 100 cc. No sign of infection on urinalysis however consistent with indwelling stent. She is on a prophylactic antibiotic.   03/21/2021: 76 year old female who has ovarian cancer with lymphadenopathy causing right-sided Laura [name] that is currently managed with a ureteral stent presents today for follow-up. She was seen a few days ago due to concerns of not urinating much. She is currently on alfuzosin. She states she is on a prophylactic antibiotic but I do not see this in the chart at this time. Her urine has no signs of bacteria or infection but is consistent with a ureteral stent. She is otherwise tolerating the stent well and denies any pain or discomfort. She endorses severe fatigue and reports that she is fairly weak.    06/24/2021: 76 year old woman with a history of ovarian cancer and retroperitoneal mass causing obstruction of right ureter underwent urgent right ureteral stent placement in March 2023. Cr 1.25 from 2.3 prior to stent placement. Mass is still causing  proximal obstruction on recent CT from March 2023. Patient is having intermittent right lower quadrant pain. She overall feels tired from the chemotherapy. She is also struggled with constipation.   09/06/2021: 76 year old woman with stage IIIc ovarian cancer and retroperitoneal mass causing obstruction of right ureter currently managed with right indwelling ureteral stent here for follow-up. Stent was placed in March 2023 after creatinine jumped to 2.3 and was noted to have Laura [name] on scan. Creatinine has come down and ranges between 1.08-1.43. She just finished 6 cycles of chemotherapy. CT from July showed the retroperitoneal mass and persistent Laura [name]. She is scheduled for repeat CT scan in approximately 2 weeks.     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 I told her that if she finds that this  does not allow her to reach her goal we could potentially try a combination of this medication and an anticholinergic. She will return reassessment. - 2021    NON-GU PMH: Anxiety Arthritis Cardiac murmur, unspecified Depression Diabetes Type  2 GERD Gout Hypercholesterolemia Hypertension Sleep Apnea    FAMILY HISTORY: 2 daughters - Daughter 2 sons - Son Hypertension - Mother, Father, Runs in Family sickle cell anemia - Daughter stroke - Sister   SOCIAL HISTORY: Marital Status: Divorced Preferred Language: English; Ethnicity: Not Hispanic Or Latino; Race: White Current Smoking Status: Patient has never smoked.   Tobacco Use Assessment Completed: Used Tobacco in last 30 days? Has never drank.  Drinks 2 caffeinated drinks per day.    REVIEW OF SYSTEMS:    GU Review Female:   Patient denies frequent urination, hard to postpone urination, burning /pain with urination, get up at night to urinate, leakage of urine, stream starts and stops, trouble starting your stream, have to strain to urinate, and being pregnant.  Gastrointestinal (Upper):   Patient denies nausea, vomiting, and indigestion/ heartburn.  Gastrointestinal (Lower):   Patient reports constipation. Patient denies diarrhea.  Constitutional:   Patient denies fever, night sweats, weight loss, and fatigue.  Skin:   Patient denies skin rash/ lesion and itching.  Eyes:   Patient denies blurred vision and double vision.  Ears/ Nose/ Throat:   Patient denies sore throat and sinus problems.  Hematologic/Lymphatic:   Patient denies swollen glands and easy bruising.  Cardiovascular:   Patient denies leg swelling and chest pains.  Respiratory:   Patient denies cough and shortness of breath.  Endocrine:   Patient denies excessive thirst.  Musculoskeletal:   Patient denies back pain and joint pain.  Neurological:   Patient denies headaches and dizziness.  Psychologic:   Patient denies depression and anxiety.   VITAL SIGNS:      09/06/2021 01:13 PM  BP 121/73 mmHg  Pulse 93 /min  Temperature 97.7 F / 36.5 C   MULTI-SYSTEM PHYSICAL EXAMINATION:    Constitutional: Well-nourished. No physical deformities. Normally developed. Good grooming.  Neck: Neck symmetrical, not  swollen. Normal tracheal position.  Respiratory: No labored breathing, no use of accessory muscles.   Skin: No paleness, no jaundice, no cyanosis. No lesion, no ulcer, no rash.  Neurologic / Psychiatric: Oriented to time, oriented to place, oriented to person. No depression, no anxiety, no agitation.  Eyes: Normal conjunctivae. Normal eyelids.  Ears, Nose, Mouth, and Throat: Left ear no scars, no lesions, no masses. Right ear no scars, no lesions, no masses. Nose no scars, no lesions, no masses. Normal hearing. Normal lips.  Musculoskeletal: Normal gait and station of head and neck.     Complexity of Data:  Records Review:   Previous Doctor Records, Previous Hospital Records, Previous Patient Records, POC Tool  Urine Test Review:   Urinalysis  Notes:                     08/14/2021: BUN 30, creatinine 1.16   PROCEDURES:          Urinalysis w/Scope Dipstick Dipstick Cont'd Micro  Color: Yellow Bilirubin: Neg mg/dL WBC/hpf: Packed/hpf  Appearance: Slightly Cloudy Ketones: Neg mg/dL RBC/hpf: 10 - 20/hpf  Specific Gravity: 1.015 Blood: 3+ ery/uL Bacteria: Mod (26-50/hpf)  pH: 5.5 Protein: Trace mg/dL Cystals: NS (Not Seen)  Glucose: Neg mg/dL Urobilinogen: 0.2 mg/dL Casts: NS (Not Seen)    Nitrites: Neg Trichomonas: Not Present    Leukocyte Esterase: 3+ leu/uL  Mucous: Not Present      Epithelial Cells: 0 - 5/hpf      Yeast: NS (Not Seen)      Sperm: Not Present    ASSESSMENT:      ICD-10 Details  1 GU:   Laura [name] - N13.0 Chronic, Stable   PLAN:           Document Letter(s):  Created for Patient: Clinical Summary         Notes:   Right Laura [name] s/p right ureteral stent placement:  -Patient with persistent hydro however kidney function is improved from prior to initial stent placement  -She has recently completed 6 cycles of chemotherapy and is scheduled to have repeat imaging in approximately 2 weeks  -Would like to exchange right ureteral stent during this breaking  chemotherapy  -I did discuss that a percutaneous nephrostomy tube would be more ideal as she still does have external compression despite the stent in place. However, her kidney function is stable enough for chemotherapy at the moment. Patient adamantly does not want a nephrostomy tube.  -We will schedule ureteral stent placement in the next few weeks
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
## 2021-09-30 ENCOUNTER — Inpatient Hospital Stay: Payer: Medicare Other | Attending: Hematology | Admitting: Physician Assistant

## 2021-09-30 VITALS — BP 142/86 | HR 91 | Temp 98.9°F | Resp 18 | Wt 185.4 lb

## 2021-09-30 DIAGNOSIS — Z803 Family history of malignant neoplasm of breast: Secondary | ICD-10-CM | POA: Insufficient documentation

## 2021-09-30 DIAGNOSIS — C569 Malignant neoplasm of unspecified ovary: Secondary | ICD-10-CM

## 2021-09-30 DIAGNOSIS — C563 Malignant neoplasm of bilateral ovaries: Secondary | ICD-10-CM | POA: Diagnosis not present

## 2021-09-30 DIAGNOSIS — N1330 Unspecified hydronephrosis: Secondary | ICD-10-CM | POA: Diagnosis not present

## 2021-09-30 DIAGNOSIS — Z808 Family history of malignant neoplasm of other organs or systems: Secondary | ICD-10-CM | POA: Insufficient documentation

## 2021-09-30 DIAGNOSIS — K5900 Constipation, unspecified: Secondary | ICD-10-CM | POA: Diagnosis not present

## 2021-09-30 DIAGNOSIS — I10 Essential (primary) hypertension: Secondary | ICD-10-CM | POA: Insufficient documentation

## 2021-09-30 DIAGNOSIS — E119 Type 2 diabetes mellitus without complications: Secondary | ICD-10-CM | POA: Diagnosis not present

## 2021-10-01 ENCOUNTER — Encounter (HOSPITAL_BASED_OUTPATIENT_CLINIC_OR_DEPARTMENT_OTHER): Payer: Self-pay | Admitting: Urology

## 2021-10-01 ENCOUNTER — Ambulatory Visit (HOSPITAL_BASED_OUTPATIENT_CLINIC_OR_DEPARTMENT_OTHER)
Admission: RE | Admit: 2021-10-01 | Discharge: 2021-10-01 | Disposition: A | Discharge status: Home / Self Care | Payer: Medicare Other | Attending: Urology | Admitting: Urology

## 2021-10-01 ENCOUNTER — Other Ambulatory Visit: Payer: Self-pay

## 2021-10-01 ENCOUNTER — Ambulatory Visit (HOSPITAL_BASED_OUTPATIENT_CLINIC_OR_DEPARTMENT_OTHER): Payer: Medicare Other | Admitting: Anesthesiology

## 2021-10-01 ENCOUNTER — Encounter (HOSPITAL_BASED_OUTPATIENT_CLINIC_OR_DEPARTMENT_OTHER)
Admission: RE | Disposition: A | Discharge status: Home / Self Care | Payer: Self-pay | Source: Home / Self Care | Attending: Urology

## 2021-10-01 DIAGNOSIS — I10 Essential (primary) hypertension: Secondary | ICD-10-CM | POA: Diagnosis not present

## 2021-10-01 DIAGNOSIS — G4730 Sleep apnea, unspecified: Secondary | ICD-10-CM | POA: Insufficient documentation

## 2021-10-01 DIAGNOSIS — C569 Malignant neoplasm of unspecified ovary: Secondary | ICD-10-CM | POA: Diagnosis not present

## 2021-10-01 DIAGNOSIS — F419 Anxiety disorder, unspecified: Secondary | ICD-10-CM | POA: Insufficient documentation

## 2021-10-01 DIAGNOSIS — E669 Obesity, unspecified: Secondary | ICD-10-CM | POA: Diagnosis not present

## 2021-10-01 DIAGNOSIS — E119 Type 2 diabetes mellitus without complications: Secondary | ICD-10-CM

## 2021-10-01 DIAGNOSIS — K219 Gastro-esophageal reflux disease without esophagitis: Secondary | ICD-10-CM | POA: Diagnosis not present

## 2021-10-01 DIAGNOSIS — Z7984 Long term (current) use of oral hypoglycemic drugs: Secondary | ICD-10-CM | POA: Insufficient documentation

## 2021-10-01 DIAGNOSIS — Z9989 Dependence on other enabling machines and devices: Secondary | ICD-10-CM | POA: Diagnosis not present

## 2021-10-01 DIAGNOSIS — N1330 Unspecified hydronephrosis: Secondary | ICD-10-CM

## 2021-10-01 DIAGNOSIS — Z923 Personal history of irradiation: Secondary | ICD-10-CM | POA: Diagnosis not present

## 2021-10-01 DIAGNOSIS — M1990 Unspecified osteoarthritis, unspecified site: Secondary | ICD-10-CM | POA: Diagnosis not present

## 2021-10-01 DIAGNOSIS — E1142 Type 2 diabetes mellitus with diabetic polyneuropathy: Secondary | ICD-10-CM | POA: Diagnosis not present

## 2021-10-01 DIAGNOSIS — G4733 Obstructive sleep apnea (adult) (pediatric): Secondary | ICD-10-CM | POA: Diagnosis not present

## 2021-10-01 DIAGNOSIS — K5900 Constipation, unspecified: Secondary | ICD-10-CM | POA: Insufficient documentation

## 2021-10-01 DIAGNOSIS — N179 Acute kidney failure, unspecified: Secondary | ICD-10-CM | POA: Insufficient documentation

## 2021-10-01 HISTORY — PX: CYSTOSCOPY W/ URETERAL STENT PLACEMENT: SHX1429

## 2021-10-01 LAB — GLUCOSE, CAPILLARY
Glucose-Capillary: 140 mg/dL — ABNORMAL HIGH (ref 70–99)
Glucose-Capillary: 133 mg/dL — ABNORMAL HIGH (ref 70–99)

## 2021-10-01 SURGERY — CYSTOSCOPY, WITH RETROGRADE PYELOGRAM AND URETERAL STENT INSERTION
Anesthesia: General | Site: Renal | Laterality: Right

## 2021-10-01 MED ORDER — GENTAMICIN SULFATE 40 MG/ML IJ SOLN
5.0000 mg/kg | INTRAVENOUS | Status: AC
  Administered 2021-10-01: 323.6 mg via INTRAVENOUS
  Filled 2021-10-01: qty 8

## 2021-10-01 MED ORDER — DEXAMETHASONE SODIUM PHOSPHATE 10 MG/ML IJ SOLN
INTRAMUSCULAR | Status: DC | PRN
  Administered 2021-10-01: 5 mg via INTRAVENOUS

## 2021-10-01 MED ORDER — 0.9 % SODIUM CHLORIDE (POUR BTL) OPTIME
TOPICAL | Status: DC | PRN
  Administered 2021-10-01: 500 mL

## 2021-10-01 MED ORDER — LACTATED RINGERS IV SOLN: INTRAVENOUS | Status: DC

## 2021-10-01 MED ORDER — SODIUM CHLORIDE 0.9 % IR SOLN
Status: DC | PRN
  Administered 2021-10-01: 3000 mL

## 2021-10-01 MED ORDER — PROPOFOL 10 MG/ML IV BOLUS
INTRAVENOUS | Status: AC
  Filled 2021-10-01: qty 20

## 2021-10-01 MED ORDER — PROPOFOL 10 MG/ML IV BOLUS
INTRAVENOUS | Status: DC | PRN
  Administered 2021-10-01: 100 mg via INTRAVENOUS
  Administered 2021-10-01 (×2): 30 mg via INTRAVENOUS

## 2021-10-01 MED ORDER — FENTANYL CITRATE (PF) 100 MCG/2ML IJ SOLN
INTRAMUSCULAR | Status: AC
  Filled 2021-10-01: qty 2

## 2021-10-01 MED ORDER — FENTANYL CITRATE (PF) 100 MCG/2ML IJ SOLN: 25.0000 ug | INTRAMUSCULAR | Status: DC | PRN

## 2021-10-01 MED ORDER — FENTANYL CITRATE (PF) 250 MCG/5ML IJ SOLN
INTRAMUSCULAR | Status: DC | PRN
  Administered 2021-10-01 (×2): 25 ug via INTRAVENOUS

## 2021-10-01 MED ORDER — CEFAZOLIN SODIUM-DEXTROSE 2-4 GM/100ML-% IV SOLN
INTRAVENOUS | Status: AC
  Filled 2021-10-01: qty 100

## 2021-10-01 MED ORDER — OXYCODONE HCL 5 MG/5ML PO SOLN: 5.0000 mg | Freq: Once | ORAL | Status: DC | PRN

## 2021-10-01 MED ORDER — OXYCODONE HCL 5 MG PO TABS: 5.0000 mg | ORAL_TABLET | Freq: Once | ORAL | Status: DC | PRN

## 2021-10-01 MED ORDER — ONDANSETRON HCL 4 MG/2ML IJ SOLN: 4.0000 mg | Freq: Once | INTRAMUSCULAR | Status: DC | PRN

## 2021-10-01 MED ORDER — ONDANSETRON HCL 4 MG/2ML IJ SOLN
INTRAMUSCULAR | Status: DC | PRN
  Administered 2021-10-01: 4 mg via INTRAVENOUS

## 2021-10-01 MED ORDER — GENTAMICIN SULFATE 40 MG/ML IJ SOLN: 2.0000 mg/kg | Freq: Once | INTRAVENOUS | Status: DC

## 2021-10-01 MED ORDER — IOHEXOL 300 MG/ML  SOLN
INTRAMUSCULAR | Status: DC | PRN
  Administered 2021-10-01: 9 mL via URETHRAL

## 2021-10-01 MED ORDER — CEFAZOLIN SODIUM-DEXTROSE 2-4 GM/100ML-% IV SOLN: 2.0000 g | INTRAVENOUS | Status: DC

## 2021-10-01 MED ORDER — KETOROLAC TROMETHAMINE 30 MG/ML IJ SOLN
INTRAMUSCULAR | Status: AC
  Filled 2021-10-01: qty 2

## 2021-10-01 SURGICAL SUPPLY — 16 items
BAG DRAIN URO-CYSTO SKYTR STRL (DRAIN) ×1 IMPLANT
BAG DRN UROCATH (DRAIN) ×1
CATH URET 5FR 28IN OPEN ENDED (CATHETERS) ×1 IMPLANT
CLOTH BEACON ORANGE TIMEOUT ST (SAFETY) ×1 IMPLANT
GLOVE BIO SURGEON STRL SZ 6.5 (GLOVE) ×1 IMPLANT
GLOVE BIOGEL PI IND STRL 6.5 (GLOVE) IMPLANT
GOWN STRL REUS W/TWL LRG LVL3 (GOWN DISPOSABLE) ×1 IMPLANT
GUIDEWIRE STR DUAL SENSOR (WIRE) ×1 IMPLANT
IV NS IRRIG 3000ML ARTHROMATIC (IV SOLUTION) ×1 IMPLANT
KIT TURNOVER CYSTO (KITS) ×1 IMPLANT
MANIFOLD NEPTUNE II (INSTRUMENTS) ×1 IMPLANT
NS IRRIG 500ML POUR BTL (IV SOLUTION) IMPLANT
PACK CYSTO (CUSTOM PROCEDURE TRAY) ×1 IMPLANT
STENT URET 6FRX24 CONTOUR (STENTS) IMPLANT
TUBE CONNECTING 12X1/4 (SUCTIONS) ×1 IMPLANT
TUBING UROLOGY SET (TUBING) ×1 IMPLANT

## 2021-10-01 NOTE — Anesthesia Preprocedure Evaluation (Addendum)
Anesthesia Evaluation  Patient identified by MRN, date of birth, ID band Patient awake    Reviewed: Allergy & Precautions, NPO status , Patient's Chart, lab work & pertinent test results  History of Anesthesia Complications (+) Family history of anesthesia reaction  Airway Mallampati: I  TM Distance: >3 FB Neck ROM: Full    Dental  (+) Missing, Dental Advisory Given,    Pulmonary sleep apnea and Continuous Positive Airway Pressure Ventilation ,    Pulmonary exam normal breath sounds clear to auscultation       Cardiovascular hypertension, + DOE  Normal cardiovascular exam+ Valvular Problems/Murmurs  Rhythm:Regular Rate:Normal  Echo 07/20/21 1. Left ventricular ejection fraction, by estimation, is 65 to 70%. The left ventricle has normal function. The left ventricle has no regional wall motion abnormalities. There is mild left ventricular hypertrophy. Left ventricular diastolic parameters are consistent with Grade I diastolic dysfunction (impaired relaxation). Strain not performed.  2. Right ventricular systolic function is normal. The right ventricular size is normal. There is normal pulmonary artery systolic pressure. The estimated right ventricular systolic pressure is 60.6 mmHg.  3. The mitral valve is normal in structure. Trivial mitral valve regurgitation. No evidence of mitral stenosis.  4. Tricuspid valve regurgitation is mild to moderate and eccentric.  5. The aortic valve is tricuspid. Aortic valve regurgitation is not visualized. No aortic stenosis is present.  6. The inferior vena cava is normal in size with greater than 50% respiratory variability, suggesting right atrial pressure of 3 mmHg.   EKG 08/08/21 NSR, low voltage   Neuro/Psych Anxiety Peripheral neuropathy    GI/Hepatic GERD  Medicated,IBS   Endo/Other  diabetes, Well Controlled, Type 2, Oral Hypoglycemic AgentsHyperlipidemia Obesity  Renal/GU Renal  diseaseRight hydronephrosis Indwelling right ureteral stent  negative genitourinary   Musculoskeletal  (+) Arthritis , Osteoarthritis,  Hx/o left frozen shoulder   Abdominal (+) + obese,   Peds  Hematology  (+) Blood dyscrasia, anemia ,   Anesthesia Other Findings   Reproductive/Obstetrics Ovarian Ca S/P RT and oophorectomy with debulking procedure                            Anesthesia Physical Anesthesia Plan  ASA: 3  Anesthesia Plan: General   Post-op Pain Management: Minimal or no pain anticipated   Induction: Intravenous  PONV Risk Score and Plan: 4 or greater and Treatment may vary due to age or medical condition, Ondansetron and Dexamethasone  Airway Management Planned: LMA  Additional Equipment: None  Intra-op Plan:   Post-operative Plan: Extubation in OR  Informed Consent: I have reviewed the patients History and Physical, chart, labs and discussed the procedure including the risks, benefits and alternatives for the proposed anesthesia with the patient or authorized representative who has indicated his/her understanding and acceptance.     Dental advisory given  Plan Discussed with: CRNA and Anesthesiologist  Anesthesia Plan Comments:        Anesthesia Quick Evaluation

## 2021-10-01 NOTE — Anesthesia Procedure Notes (Signed)
Procedure Name: LMA Insertion Date/Time: 10/01/2021 2:29 PM  Performed by: Justice Rocher, CRNAPre-anesthesia Checklist: Patient identified, Emergency Drugs available, Suction available, Patient being monitored and Timeout performed Patient Re-evaluated:Patient Re-evaluated prior to induction Oxygen Delivery Method: Circle system utilized Preoxygenation: Pre-oxygenation with 100% oxygen Induction Type: IV induction Ventilation: Mask ventilation without difficulty LMA: LMA inserted LMA Size: 4.0 Number of attempts: 1 Airway Equipment and Method: Bite block Placement Confirmation: positive ETCO2, breath sounds checked- equal and bilateral and CO2 detector Tube secured with: Tape Dental Injury: Teeth and Oropharynx as per pre-operative assessment

## 2021-10-01 NOTE — Transfer of Care (Signed)
Immediate Anesthesia Transfer of Care Note  Patient: Laura [name]  Procedure(s) Performed: Procedure(s) (LRB): CYSTOSCOPY WITH RETROGRADE PYELOGRAM/URETERAL STENT REPLACEMENT (Right)  Patient Location: PACU  Anesthesia Type: General  Level of Consciousness: awake, sedated, patient cooperative and responds to stimulation  Airway & Oxygen Therapy: Patient Spontanous Breathing and Patient connected to [place] 02   Post-op Assessment: Report given to PACU RN, Post -op Vital signs reviewed and stable and Patient moving all extremities  Post vital signs: Reviewed and stable  Complications: No apparent anesthesia complications

## 2021-10-01 NOTE — Interval H&P Note (Signed)
History and Physical Interval Note:  10/01/2021 12:44 PM  Laura [name]  has presented today for surgery, with the diagnosis of RIGHT HYDRNEPHROSIS.  The various methods of treatment have been discussed with the patient and family. After consideration of risks, benefits and other options for treatment, the patient has consented to  Procedure(s) with comments: CYSTOSCOPY WITH RETROGRADE PYELOGRAM/URETERAL STENT REPLACEMENT (Right) - 30 MINS as a surgical intervention.  The patient's history has been reviewed, patient examined, no change in status, stable for surgery.  I have reviewed the patient's chart and labs.  Questions were answered to the patient's satisfaction.     Dheeraj Hail D Tevion Laforge

## 2021-10-01 NOTE — OR Nursing (Signed)
Right Stent was taken out at 1437H by Dr. Claudia Desanctis.

## 2021-10-01 NOTE — Anesthesia Postprocedure Evaluation (Signed)
Anesthesia Post Note  Patient: Laura [name]  Procedure(s) Performed: CYSTOSCOPY WITH RETROGRADE PYELOGRAM/URETERAL STENT REPLACEMENT (Right: Renal)     Patient location during evaluation: PACU Anesthesia Type: General Level of consciousness: awake and alert and oriented Pain management: pain level controlled Vital Signs Assessment: post-procedure vital signs reviewed and stable Respiratory status: spontaneous breathing, nonlabored ventilation and respiratory function stable Cardiovascular status: blood pressure returned to baseline and stable Postop Assessment: no apparent nausea or vomiting Anesthetic complications: no   No notable events documented.  Last Vitals:  Vitals:   10/01/21 1515 10/01/21 1530  BP: (!) 130/57 136/65  Pulse: 64 68  Resp: 13 16  Temp:    SpO2: 100% 93%    Last Pain:  Vitals:   10/01/21 1515  TempSrc:   PainSc: 0-No pain                 Chevon Laufer A.

## 2021-10-01 NOTE — Discharge Instructions (Addendum)
Post stent exchange instructions   Definitions:  Ureter: The duct that transports urine from the kidney to the bladder. Stent: A plastic hollow tube that is placed into the ureter, from the kidney to the bladder to prevent the ureter from swelling shut.  General instructions:  Despite the fact that no skin incisions were used, the area around the ureter and bladder is raw and irritated. The stent is a foreign body which can further irritate the bladder wall. This irritation is manifested by increased frequency of urination, both day and night, and by an increase in the urge to urinate. In some, the urge to urinate is present almost always. Sometimes the urge is strong enough that you may not be able to stop your self from urinating. This can often be controlled with medication but does not occur in everyone. A stent can safely be left in place for 3 months or greater.  You may see some blood in your urine while the stent is in place and a few days afterward. Do not be alarmed, even if the urine is clear for a while. Get off your feet and drink lots of fluids until clearing occurs. If you start to pass clots or don't improve, call us.  Diet:  You may return to your normal diet immediately. Because of the raw surface of your bladder, alcohol, spicy foods, foods high in acid and drinks with caffeine may cause irritation or frequency and should be used in moderation. To keep your urine flowing freely and avoid constipation, drink plenty of fluids during the day (8-10 glasses). Tip: Avoid cranberry juice because it is very acidic.  Activity:  Your physical activity doesn't need to be restricted. However, if you are very active, you may see some blood in the urine. We suggest that you reduce your activity under the circumstances until the bleeding has stopped.  Bowels:  It is important to keep your bowels regular during the postoperative period. Straining with bowel movements can cause bleeding. A  bowel movement every other day is reasonable. Use a mild laxative if needed, such as milk of magnesia 2-3 tablespoons, or 2 Dulcolax tablets. Call if you continue to have problems. If you had been taking narcotics for pain, before, during or after your surgery, you may be constipated. Take a laxative if necessary.  Medication:  You should resume your pre-surgery medications unless told not to. In addition you may be given an antibiotic to prevent or treat infection. Antibiotics are not always necessary. All medication should be taken as prescribed until the bottles are finished unless you are having an unusual reaction to one of the drugs.  Problems you should report to [place]:  a. Fever greater than 101F. b. Heavy bleeding, or clots (see notes above about blood in urine). c. Inability to urinate. d. Drug reactions (hives, rash, nausea, vomiting, diarrhea). e. Severe burning or pain with urination that is not improving.    Post Anesthesia Home Care Instructions  Activity: Get plenty of rest for the remainder of the day. A responsible individual must stay with you for 24 hours following the procedure.  For the next 24 hours, DO NOT: -Drive a car -Paediatric nurse -Drink alcoholic beverages -Take any medication unless instructed by your physician -Make any legal decisions or sign important papers.  Meals: Start with liquid foods such as gelatin or soup. Progress to regular foods as tolerated. Avoid greasy, spicy, heavy foods. If nausea and/or vomiting occur, drink only clear liquids until the  nausea and/or vomiting subsides. Call your physician if vomiting continues.  Special Instructions/Symptoms: Your throat may feel dry or sore from the anesthesia or the breathing tube placed in your throat during surgery. If this causes discomfort, gargle with warm salt water. The discomfort should disappear within 24 hours.

## 2021-10-01 NOTE — Op Note (Signed)
Preoperative diagnosis:  Right hydronephrosis   Postoperative diagnosis:  Right hydronephrosis   Procedure:  Cystoscopy right ureteral stent exchange - 6 Fr by 24 cm no tether right retrograde pyelography with interpretation   Surgeon: Jacalyn Lefevre, MD  Anesthesia: General  Complications: None  Intraoperative findings:   EBL: Minimal  Specimens: None  Indication: Laura [name] is a 76 y.o. patient with right hydronephrosis due to external compression from retroperitoneal lymphadenopathy. After reviewing the management options for treatment, he elected to proceed with the above surgical procedure(s). We have discussed the potential benefits and risks of the procedure, side effects of the proposed treatment, the likelihood of the patient achieving the goals of the procedure, and any potential problems that might occur during the procedure or recuperation. Informed consent has been obtained.  Description of procedure:  The patient was taken to the operating room and general anesthesia was induced.  The patient was placed in the dorsal lithotomy position, prepped and draped in the usual sterile fashion, and preoperative antibiotics were administered. A preoperative time-out was performed.   Cystourethroscopy was performed.  The patient's urethra was examined and was normal.  The bladder was then systematically examined in its entirety. There was no evidence for any bladder tumors, stones, or other mucosal pathology.    Attention then turned to the right ureteral orifice and the graspers were used to bring the existing ureteral stent to the urethral meatus.  A 0.38 sensor wire was advanced through the ureteral stent and advanced to the kidney with fluoroscopic guidance.  The stent was removed and examined.  It was completely intact at removal.  Next an open-ended ureteral catheter was placed over the sensor wire to the mid ureter.  A retrograde pyelogram was then obtained using Omnipaque  contrast.  It showed a tortuous proximal ureter with moderate hydronephrosis.   A 0.38 sensor guidewire was then advanced up the right ureter into the renal pelvis under fluoroscopic guidance through the open-ended ureteral catheter and the open-ended ureteral catheter was removed.  The wire was then backloaded through the cystoscope and a ureteral stent was advance over the wire using Seldinger technique.  The stent was positioned appropriately under fluoroscopic and cystoscopic guidance.  The wire was then removed with an adequate stent curl noted in the renal pelvis as well as in the bladder.  The bladder was then emptied and the procedure ended.  The patient appeared to tolerate the procedure well and without complications.  The patient was able to be awakened and transferred to the recovery unit in satisfactory condition.    Jacalyn Lefevre, M.D.

## 2021-10-02 ENCOUNTER — Encounter (HOSPITAL_BASED_OUTPATIENT_CLINIC_OR_DEPARTMENT_OTHER): Payer: Self-pay | Admitting: Urology

## 2021-10-08 ENCOUNTER — Other Ambulatory Visit (HOSPITAL_COMMUNITY): Payer: Medicare Other

## 2021-10-12 ENCOUNTER — Other Ambulatory Visit: Payer: Self-pay

## 2021-10-15 DIAGNOSIS — N130 Hydronephrosis with ureteropelvic junction obstruction: Secondary | ICD-10-CM | POA: Diagnosis not present

## 2021-10-16 ENCOUNTER — Ambulatory Visit: Payer: Medicare Other | Admitting: Hematology

## 2021-10-24 ENCOUNTER — Other Ambulatory Visit (HOSPITAL_BASED_OUTPATIENT_CLINIC_OR_DEPARTMENT_OTHER): Payer: Self-pay

## 2021-10-24 ENCOUNTER — Encounter (HOSPITAL_BASED_OUTPATIENT_CLINIC_OR_DEPARTMENT_OTHER): Payer: Self-pay | Admitting: Pharmacist

## 2021-10-24 ENCOUNTER — Telehealth: Payer: Self-pay

## 2021-10-24 DIAGNOSIS — E119 Type 2 diabetes mellitus without complications: Secondary | ICD-10-CM

## 2021-10-24 NOTE — Addendum Note (Signed)
Addended by: Luretha Rued on: 10/24/2021 07:05 PM   Modules accepted: Orders

## 2021-10-24 NOTE — Patient Outreach (Addendum)
  Care Coordination   Initial Visit Note   10/24/2021 Name: RINA ADNEY MRN: [id number] DOB: [birth year]  ILLIANA LOSURDO is a 76 y.o. year old female who sees Biagio Borg, MD for primary care. I spoke with  [name] by phone today.  What matters to the patients health and wellness today?  Ms. Remmert reports she is currently going through chemotherapy. She expresses she is still eating and her weight is good, but would like information on good nutrition. She denies depression, but expresses sometimes she feels down and is agreeable to LCSW referral to discuss.   Goals Addressed             This Visit's Progress    Health Management Community resources       Care Coordination Interventions: Discussed care coordination program Care guide referral for community resources Social work referral-patient interested in talk therapy Discussed Healthy eating       SDOH assessments and interventions completed:  Yes  SDOH Interventions Today    Flowsheet Row Most Recent Value  SDOH Interventions   Food Insecurity Interventions Intervention Not Indicated  Housing Interventions Other (Comment)  [referral to care guide for resources]  Transportation Interventions Intervention Not Indicated     Care Coordination Interventions Activated:  Yes  Care Coordination Interventions:  Yes, provided   Follow up plan: Follow up call scheduled for 11/13/21    Encounter Outcome:  Pt. Visit Completed   Thea Silversmith, RN, MSN, BSN, [place] Coordinator [phone]

## 2021-10-24 NOTE — Patient Instructions (Addendum)
Visit Information  Thank you for taking time to visit with me today. Please don't hesitate to contact me if I can be of assistance to you.   Following are the goals we discussed today:   Goals Addressed             This Visit's Progress    Health Management Community resources       Care Coordination Interventions: Discussed care coordination program Care guide referral for community resources Social work referral-patient interested in talk therapy Discussed Healthy eating       Our next appointment is by telephone on 11/13/21 at 2:30 pm  Please call the care guide team at [phone] if you need to cancel or reschedule your appointment.   If you are experiencing a Mental Health or [place] or need someone to talk to, please call the Suicide and Crisis Lifeline: 988  Patient verbalizes understanding of instructions and care plan provided today and agrees to view in [place]. Active MyChart status and patient understanding of how to access instructions and care plan via MyChart confirmed with patient.     Thea Silversmith, RN, MSN, BSN, [place] Coordinator [phone]

## 2021-10-25 ENCOUNTER — Other Ambulatory Visit: Payer: Self-pay

## 2021-10-25 ENCOUNTER — Other Ambulatory Visit (HOSPITAL_BASED_OUTPATIENT_CLINIC_OR_DEPARTMENT_OTHER): Payer: Self-pay

## 2021-10-25 ENCOUNTER — Telehealth: Payer: Self-pay

## 2021-10-25 MED ORDER — TRAMADOL HCL 50 MG PO TABS
50.0000 mg | ORAL_TABLET | Freq: Four times a day (QID) | ORAL | 0 refills | Status: DC | PRN
  Filled 2021-10-25: qty 15, 4d supply, fill #0

## 2021-10-25 NOTE — Telephone Encounter (Signed)
   Telephone encounter was:  Unsuccessful.  10/25/2021 Name: Laura [name] MRN: [id number] DOB: 1945/04/02  Unsuccessful outbound call made today to assist with:   pest control list.  Outreach Attempt:  1st Attempt  A HIPAA compliant voice message was left requesting a return call.  Instructed patient to call back at [phone].  [place] Resource Care Guide   [email or web address]  ?? [phone]   Website: [email or web address]  [email or web address]

## 2021-10-28 ENCOUNTER — Ambulatory Visit: Payer: Self-pay | Admitting: Licensed Clinical Social Worker

## 2021-10-28 NOTE — Patient Outreach (Signed)
  Care Coordination  Initial Visit Note   10/28/2021 Name: Laura [name] MRN: [id number] DOB: 09-08-1945  Laura [name] is a 76 y.o. year old female who sees Biagio Borg, MD for primary care. I spoke with  Laura [name] by phone today.  What matters to the patients health and wellness today?  Help with managing stress Patient is experiencing symptoms of  stress which seems to be exacerbated by decline in her health and not being able to do the things she use to do..   Recommendation: Patient may benefit from, and is in agreement to Connect for therapy to assist with finding her new normal , however undecided on which agency to select LCSW will spend more time during next encounter to provide more support to assist with this process.    Goals Addressed             This Visit's Progress    Care Coordination Activiites/ Connect for therapy       Care Coordination Interventions: Solution-Focused Strategies employed:  Active listening / Reflection utilized  Emotional Support Provided Problem [place] strategies reviewed Provided general psycho-education for mental health needs  Discussed referral options to connect for ongoing therapy: See options below   [facility] [address] [place], [place] [address] Phone [phone] [email or web address]     [facility] [address], [place] [address] [email or web address]  Phone: [phone]   [facility], [place], [place] [address] Phone [phone] [email or web address]     [facility]  [address] [place], [place] [address] [phone] [email or web address]    Hours are 8 to 5 Mon- Thursday and virtual on Friday        SDOH assessments and interventions completed:  Yes  SDOH Interventions Today     Flowsheet Row Most Recent Value  SDOH Interventions   Stress Interventions Provide Counseling        Care Coordination Interventions Activated:  Yes  Care Coordination Interventions:  Yes, provided   Follow up plan: Follow up call scheduled for 10/31/2021    Encounter Outcome:  Pt. Visit Completed   [name], [place] [phone]

## 2021-10-28 NOTE — Patient Instructions (Signed)
Visit Information  Thank you for taking time to visit with me today. Please don't hesitate to contact me if I can be of assistance to you.   Following are the goals we discussed today:  Please review the therapy agencies below and we will discuss on Thursday.  Goals Addressed             This Visit's Progress    Care Coordination Activiites/ Connect for therapy       Care Coordination Interventions: Solution-Focused Strategies employed:  Active listening / Reflection utilized  Emotional Support Provided Problem [place] strategies reviewed Provided general psycho-education for mental health needs  Discussed referral options to connect for ongoing therapy: See options below   [facility] [address] [place], [place] [address] Phone [phone] [email or web address]     [facility] [address], [place] [address] [email or web address]  Phone: [phone]   [facility], [place], [place] [address] Phone [phone] [email or web address]     [facility]  [address] [place], [place] [address] [phone] [email or web address]    Hours are 8 to 5 Mon- Thursday and virtual on Friday        Our next appointment is by telephone on 10/31/21 at 11:00  Please call the care guide team at [phone] if you need to cancel or reschedule your appointment.    Patient verbalizes understanding of instructions and care plan provided today and agrees to view in [place]. Active MyChart status and patient understanding of how to access instructions and care plan via MyChart confirmed with patient.     [name], [place] [phone]

## 2021-10-30 ENCOUNTER — Telehealth: Payer: Self-pay

## 2021-10-30 NOTE — Telephone Encounter (Signed)
   Telephone encounter was:  Unsuccessful.  10/30/2021 Name: Laura [name] MRN: [id number] DOB: May 20, 1945  Unsuccessful outbound call made today to assist with:   pest control list.  Outreach Attempt:  2nd Attempt  A HIPAA compliant voice message was left requesting a return call.  Instructed patient to call back at [phone].  [place] Resource Care Guide   [email or web address]  ?? [phone]   Website: [email or web address]  [email or web address]

## 2021-10-31 ENCOUNTER — Ambulatory Visit: Payer: Self-pay | Admitting: Licensed Clinical Social Worker

## 2021-10-31 DIAGNOSIS — F419 Anxiety disorder, unspecified: Secondary | ICD-10-CM

## 2021-10-31 DIAGNOSIS — R4589 Other symptoms and signs involving emotional state: Secondary | ICD-10-CM

## 2021-10-31 NOTE — Patient Outreach (Signed)
  Care Coordination  Follow Up Visit Note   10/31/2021 Name: Laura [name] MRN: [id number] DOB: 12-20-1945  Laura [name] is a 76 y.o. year old female who sees Biagio Borg, MD for primary care. I spoke with  [name] by phone today.  What matters to the patients health and wellness today?  Connect for ongoing counseling  Patient is experiencing symptoms of  anxiety and depression which seems to be exacerbated by life transitions and decline in her health. LCSW  collaborated with several therapy agencies  to assist with meeting patient's needs.  .  Unable to locate a provider at this time will provide support until a provider is located    Goals Addressed             This Visit's Progress    Care Coordination Activiites/ Connect for therapy       Care Coordination Interventions: Solution-Focused Strategies employed:  Active listening / Reflection utilized  Emotional Support Provided Problem [place] strategies reviewed Provided general psycho-education for mental health needs  Discussed referral options to connect for ongoing therapy: Assisted patient with calling and exploring options with the agencies below  Appointment scheduled with [facility] based on need and insurance   [facility] [address] [place], [place] [address] Phone [phone] [email or web address]     [facility], [place], [place] [address] Phone [phone] [email or web address]             SDOH assessments and interventions completed:  No    Care Coordination Interventions Activated:  Yes  Care Coordination Interventions:  Yes, provided   Follow up plan: Follow up call scheduled for 11/06/21    Encounter Outcome:  Pt. Visit Completed   [name], [place] [phone]

## 2021-10-31 NOTE — Patient Instructions (Signed)
Visit Information  Thank you for taking time to visit with me today. Please don't hesitate to contact me if I can be of assistance to you.   Following are the goals we discussed today:   Goals Addressed             This Visit's Progress    Care Coordination Activiites/ Connect for therapy       Care Coordination Interventions: Solution-Focused Strategies employed:  Active listening / Reflection utilized  Emotional Support Provided Problem [place] strategies reviewed Provided general psycho-education for mental health needs  Discussed referral options to connect for ongoing therapy: Assisted patient with calling and exploring options with the agencies below  Made referral to [facility]  ; Will f/u  based on need and insurance, Non are in network with both insurance.  [facility] [address] [place], [place] [address] Phone [phone] [email or web address]     [facility], [place], [place] [address] Phone [phone] [email or web address]    [facility] [address],  [address] [email or web address]  Phone: [phone]         Our next appointment is by telephone on 11/06/21 at 10:00  Please call the care guide team at [phone] if you need to cancel or reschedule your appointment.   If you are experiencing a Mental Health or [place] or need someone to talk to, please call the Suicide and Crisis Lifeline: 988 call the [place] National Suicide Prevention Lifeline: (548)623-8426 or TTY: 819-776-8877 TTY 717-748-1160) to talk to a trained counselor call 1-800-273-TALK (toll free, 24 hour hotline) go to [facility] [address], [place] [phone])   Patient verbalizes understanding of instructions and care plan provided today  and agrees to view in [place]. Active MyChart status and patient understanding of how to access instructions and care plan via MyChart confirmed with patient.     [name], [place] [phone]

## 2021-11-01 ENCOUNTER — Encounter: Payer: Self-pay | Admitting: Hematology

## 2021-11-01 ENCOUNTER — Telehealth: Payer: Self-pay

## 2021-11-01 ENCOUNTER — Encounter (HOSPITAL_COMMUNITY): Payer: Self-pay | Admitting: Hematology

## 2021-11-01 NOTE — Telephone Encounter (Signed)
   Telephone encounter was:  Unsuccessful.  11/01/2021 Name: Laura [name] MRN: [id number] DOB: 24-Sep-1945  Unsuccessful outbound call made today to assist with:   pest control list.  Outreach Attempt:  3rd Attempt.  Referral closed unable to contact patient.  A HIPAA compliant voice message was left requesting a return call.  Instructed patient to call back at [phone].  [facility] Care Guide   [name]  ?? [phone]   Website: [email or web address]

## 2021-11-02 ENCOUNTER — Other Ambulatory Visit: Payer: Self-pay

## 2021-11-07 ENCOUNTER — Ambulatory Visit (INDEPENDENT_AMBULATORY_CARE_PROVIDER_SITE_OTHER): Payer: Medicare Other | Admitting: Internal Medicine

## 2021-11-07 ENCOUNTER — Other Ambulatory Visit: Payer: Self-pay | Admitting: Internal Medicine

## 2021-11-07 VITALS — BP 138/80 | HR 81 | Temp 98.9°F | Ht 63.0 in | Wt 186.0 lb

## 2021-11-07 DIAGNOSIS — I10 Essential (primary) hypertension: Secondary | ICD-10-CM

## 2021-11-07 DIAGNOSIS — M7989 Other specified soft tissue disorders: Secondary | ICD-10-CM | POA: Diagnosis not present

## 2021-11-07 DIAGNOSIS — M79662 Pain in left lower leg: Secondary | ICD-10-CM

## 2021-11-07 DIAGNOSIS — E119 Type 2 diabetes mellitus without complications: Secondary | ICD-10-CM | POA: Diagnosis not present

## 2021-11-07 DIAGNOSIS — E559 Vitamin D deficiency, unspecified: Secondary | ICD-10-CM | POA: Diagnosis not present

## 2021-11-07 NOTE — Telephone Encounter (Signed)
Please refill as per office routine med refill policy (all routine meds to be refilled for 3 mo or monthly (per pt preference) up to one year from last visit, then month to month grace period for 3 mo, then further med refills will have to be denied) ? ?

## 2021-11-07 NOTE — Patient Instructions (Addendum)
You will be contacted regarding the referral for: left leg venous doppler to check for blood clot - stat  Please continue all other medications as before, and refills have been done if requested.  Please have the pharmacy call with any other refills you may need.  Please continue your efforts at being more active, low cholesterol diet, and weight control  Please keep your appointments with your specialists as you may have planned  Please make an Appointment to return in 4 months, or sooner if needed

## 2021-11-07 NOTE — Progress Notes (Signed)
Patient ID: Laura [name], female   DOB: February 10, 1945, 76 y.o.   MRN: [id number]        Chief Complaint: follow up left leg pain and swelling, htn, dm, lwo vit d       HPI:  Laura [name] is a 76 y.o. female here with 6 days onset left lower pain and swelling; has known hx of malignancy, and recent urethral procedure per urology sept 60.  Unhappy with some details about her recent urology visits.    Pt denies chest pain, increased sob or doe, wheezing, orthopnea, PND, increased LE swelling, palpitations, dizziness or syncope.   Pt denies polydipsia, polyuria, or new focal neuro s/s.          Wt Readings from Last 3 Encounters:  11/07/21 186 lb (84.4 kg)  10/01/21 183 lb 6.4 oz (83.2 kg)  09/30/21 185 lb 6.4 oz (84.1 kg)   BP Readings from Last 3 Encounters:  11/07/21 138/80  10/01/21 136/65  09/30/21 (!) 142/86         Past Medical History:  Diagnosis Date   Anemia    Anxiety    Arthritis    back of neck, bones spurs on neck   Cancer (HCC)    ovarian cancer   Cataract both eyes    Cervical disc disease    Diabetes mellitus Type 2    Family history of adverse reaction to anesthesia    sister slow to awaken   Family history of thyroid cancer    GERD (gastroesophageal reflux disease)    Heart murmur    History of COVID-19 06/2020   pain in side took paxlovid x 5 days all symptoms resolved   History of radiation therapy    abdominal lymph nodes 01/21/2021-02/01/2021  Dr Gery Pray   Hydronephrosis 08/24/2020   Right Kidney (stable per CT)   Hyperlipidemia    Hypertension    IBS (irritable bowel syndrome)    Left shoulder frozen with limited rom    Mucoid cyst of joint    right thumb   Neuropathy 03/12/2021   hands/fingers both hands numb and tingle @ times   Port-A-Cath in place 07/21/2019   Reflux    Sleep apnea 03/12/2021   has not used cpap in 3 years   Vertigo 12/2010   none since treated at [facility]   Wears glasses for reading    Wears partial dentures upper    Past  Surgical History:  Procedure Laterality Date   BLADDER SURGERY     x 3 at [facility]   BREAST EXCISIONAL BIOPSY Bilateral    BREAST SURGERY     fibroid cyst removed ? over 10 yrs ago [facility] per pt on 03-12-2021   CHOLECYSTECTOMY     yrs ago   COLONOSCOPY  07/02/2020   and 09-19-2016   CYSTOSCOPY W/ URETERAL STENT PLACEMENT Right 03/13/2021   Procedure: CYSTOSCOPY WITH RETROGRADE PYELOGRAM/URETERAL STENT PLACEMENT;  Surgeon: Robley Fries, MD;  Location: [facility];  Service: Urology;  Laterality: Right;  30 MINS   CYSTOSCOPY W/ URETERAL STENT PLACEMENT Right 10/01/2021   Procedure: CYSTOSCOPY WITH RETROGRADE PYELOGRAM/URETERAL STENT REPLACEMENT;  Surgeon: Robley Fries, MD;  Location: [facility];  Service: Urology;  Laterality: Right;   fibroids removed     breast (both breasts) age 27 and total of 3 surgeries   history of chemotherapy     6 rounds june 2021   IR IMAGING GUIDED PORT INSERTION  07/26/2019   right   MASS EXCISION Right 06/26/2016   Procedure: EXCISION MUCOID TUMOR RIGHT THUMB, IP RIGHT THUMB;  Surgeon: Daryll Brod, MD;  Location: [facility];  Service: Orthopedics;  Laterality: Right;   ROBOTIC ASSISTED BILATERAL SALPINGO OOPHERECTOMY N/A 01/03/2020   Procedure: XI ROBOTIC ASSISTED BILATERAL SALPINGO OOPHORECTOMY, RADICAL TUMOR DEBULKING;  Surgeon: Everitt Amber, MD;  Location: [facility];  Service: Gynecology;  Laterality: N/A;   ROBOTIC PELVIC AND PARA-AORTIC LYMPH NODE DISSECTION N/A 01/03/2020   Procedure: XI ROBOTIC PARA-AORTIC LYMPHADENECTOMY;  Surgeon: Everitt Amber, MD;  Location: [facility];  Service: Gynecology;  Laterality: N/A;   VAGINAL HYSTERECTOMY     age late 61's    reports that she has never smoked. She has never used smokeless tobacco. She reports that she does not drink alcohol and does not use drugs. family history includes Breast cancer in her sister; Diabetes in her father, maternal aunt, and sister; Hypertension in  her father, mother, sister, and another family member; Stroke in her sister; Thyroid cancer (age of onset: 13) in her daughter. Allergies  Allergen Reactions   Ciprofloxacin Swelling    Torn tendon   Hydrocodone Bit-Homatrop Mbr Other (See Comments)    Vertigo *pt strongly prefers to never take* took 3 years to recover from   Sulfa Antibiotics Hives, Itching and Swelling    Tongue swells   Alfuzosin Other (See Comments)    Weakness and fatigue   Codeine Itching   Crestor [Rosuvastatin Calcium] Other (See Comments)    Did something to memory     Doxycycline Other (See Comments)    Severe rectal Gas.   Gabapentin     disoriented   Keflex [Cephalexin] Diarrhea and Nausea And Vomiting   Myrbetriq [place]] Swelling    Swelling of legs and feet   Naproxen Other (See Comments)    Stomach cramps/loud gas   Statins     Muscle weakness   Prednisone Anxiety    *pt strongly prefers to never be given prednisone*    Current Outpatient Medications on File Prior to Visit  Medication Sig Dispense Refill   famotidine-calcium carbonate-magnesium hydroxide (PEPCID COMPLETE) 10-800-165 MG chewable tablet      acetaminophen (TYLENOL) 500 MG tablet Take 1 tablet (500 mg total) by mouth every 6 (six) hours as needed. 30 tablet 0   Bismuth Subsalicylate (PEPTO-BISMOL MAX STRENGTH PO) Take by mouth as needed.     Blood Glucose Monitoring Suppl (ONE TOUCH ULTRA 2) w/Device KIT Use as directed 1 each 0   Cholecalciferol (VITAMIN D3) 50 MCG (2000 UT) TABS Take by mouth.     DM-APAP-CPM (CORICIDIN HBP) 10-325-2 MG TABS Take 1 tablet by mouth daily as needed.     Famotidine (PEPCID AC PO) Take by mouth as needed.     feeding supplement (BOOST HIGH PROTEIN) LIQD Take 237 mLs by mouth daily.     glimepiride (AMARYL) 2 MG tablet Take 0.5 tablets (1 mg total) by mouth daily before breakfast. 1/2 of 2 mg tab 90 tablet 3   glucose blood (ONETOUCH ULTRA) test strip USE TO CHECK BLOOD SUGARS TWO TIMES DAILY  100 strip 3   Lancets (ONETOUCH ULTRASOFT) lancets 1 each by Other route as needed for other. Use as instructed 100 each 3   loratadine (CLARITIN) 10 MG tablet Take 10 mg by mouth daily as needed (allergies.).     LORazepam (ATIVAN) 0.5 MG tablet Take 1 tablet (0.5 mg total) by mouth 2 (two) times  daily as needed for anxiety. 60 tablet 0   Metamucil Fiber CHEW Chew by mouth.     traMADol (ULTRAM) 50 MG tablet 1 tablet PO Q 6 H PRN severe right lower quadrant pain 10 tablet 0   traMADol (ULTRAM) 50 MG tablet Take 1 tablet by mouth every 6 hours as needed for severe pain 15 tablet 0   vitamin B-12 (CYANOCOBALAMIN) 1000 MCG tablet Take 1,000 mcg by mouth daily.      No current facility-administered medications on file prior to visit.        ROS:  All others reviewed and negative.  Objective        PE:  BP 138/80 (BP Location: Left Arm, Patient Position: Sitting, Cuff Size: Large)   Pulse 81   Temp 98.9 F (37.2 C) (Oral)   Ht _0  (1.6 m)   Wt 186 lb (84.4 kg)   SpO2 98%   BMI 32.95 kg/m                 Constitutional: Pt appears in NAD               HENT: Head: NCAT.                Right Ear: External ear normal.                 Left Ear: External ear normal.                Eyes: . Pupils are equal, round, and reactive to light. Conjunctivae and EOM are normal               Nose: without d/c or deformity               Neck: Neck supple. Gross normal ROM               Cardiovascular: Normal rate and regular rhythm.                 Pulmonary/Chest: Effort normal and breath sounds without rales or wheezing.                Abd:  Soft, NT, ND, + BS, no organomegaly               Neurological: Pt is alert. At baseline orientation, motor grossly intact               Skin: Skin is warm. No rashes, no other new lesions, LE edema - LLE with 1+ edema, diffuse tender swelling below knee               Psychiatric: Pt behavior is normal without agitation   Micro: none  Cardiac tracings I  have personally interpreted today:  none  Pertinent Radiological findings (summarize): none   Lab Results  Component Value Date   WBC 5.4 09/04/2021   HGB 9.6 (L) 09/04/2021   HCT 29.3 (L) 09/04/2021   PLT 114 (L) 09/04/2021   GLUCOSE 146 (H) 09/04/2021   CHOL 237 (H) 03/06/2021   TRIG 169.0 (H) 03/06/2021   HDL 49.20 03/06/2021   LDLDIRECT 172.0 02/28/2020   LDLCALC 154 (H) 03/06/2021   ALT 11 09/04/2021   AST 17 09/04/2021   NA 138 09/04/2021   K 4.1 09/04/2021   CL 110 09/04/2021   CREATININE 1.22 (H) 09/04/2021   BUN 28 (H) 09/04/2021   CO2 22 09/04/2021   TSH 1.38 03/06/2021   INR 1.0 06/06/2019  HGBA1C 6.2 (A) 08/07/2021   MICROALBUR 2.1 (H) 03/06/2021   Assessment/Plan:  Laura [name] is a 76 y.o. Black or African American [2] female with  has a past medical history of Anemia, Anxiety, Arthritis, Cancer ([place]), Cataract both eyes, Cervical disc disease, Diabetes mellitus Type 2, Family history of adverse reaction to anesthesia, Family history of thyroid cancer, GERD (gastroesophageal reflux disease), Heart murmur, History of COVID-19 (06/2020), History of radiation therapy, Hydronephrosis (08/24/2020), Hyperlipidemia, Hypertension, IBS (irritable bowel syndrome), Left shoulder frozen with limited rom, Mucoid cyst of joint, Neuropathy (03/12/2021), Port-A-Cath in place (07/21/2019), Reflux, Sleep apnea (03/12/2021), Vertigo (12/2010), Wears glasses for reading, and Wears partial dentures upper.  No problem-specific Assessment & Plan notes found for this encounter.  Followup: No follow-ups on file.  Cathlean Cower, MD 11/07/2021 2:31 PM [facility]

## 2021-11-08 ENCOUNTER — Telehealth: Payer: Self-pay | Admitting: Internal Medicine

## 2021-11-08 ENCOUNTER — Other Ambulatory Visit: Payer: Self-pay

## 2021-11-08 NOTE — Telephone Encounter (Signed)
Patient has not been contacted about her stat referral for the doppler on her leg.  Please advise.

## 2021-11-08 NOTE — Telephone Encounter (Signed)
I have reordered stat, I am not sure why the issue.  thanks

## 2021-11-09 ENCOUNTER — Encounter: Payer: Self-pay | Admitting: Internal Medicine

## 2021-11-09 DIAGNOSIS — M79662 Pain in left lower leg: Secondary | ICD-10-CM | POA: Insufficient documentation

## 2021-11-09 NOTE — Assessment & Plan Note (Signed)
Last vitamin D Lab Results  Component Value Date   VD25OH 49.13 03/06/2021   Stable, cont oral replacement

## 2021-11-09 NOTE — Assessment & Plan Note (Signed)
BP Readings from Last 3 Encounters:  11/07/21 138/80  10/01/21 136/65  09/30/21 (!) 142/86   Stable, pt to continue medical treatment norvasc 5 mg qd

## 2021-11-09 NOTE — Assessment & Plan Note (Signed)
Lab Results  Component Value Date   HGBA1C 6.2 (A) 08/07/2021   Stable, pt to continue current medical treatment glimeparide 1 mg qd

## 2021-11-09 NOTE — Assessment & Plan Note (Signed)
Pt with hx of malignancy, and recent urological procedure, now with 6 days LLE pain and swelling below the knee - has high suspicion for DVT - for stat venous doppler

## 2021-11-11 ENCOUNTER — Ambulatory Visit: Payer: Self-pay | Admitting: Licensed Clinical Social Worker

## 2021-11-11 NOTE — Patient Instructions (Signed)
Visit Information  Thank you for taking time to visit with me today. Please don't hesitate to contact me if I can be of assistance to you.   Following are the goals we discussed today:   Goals Addressed             This Visit's Progress    Care Coordination Activiites/ Connect for therapy       Care Coordination Interventions: Motivational Interviewing employed Solution-Focused Strategies employed:  Active listening / Reflection utilized  Emotional Support Provided Problem [place] strategies reviewed Provided general psycho-education for mental health needs  Provided EMMI education information on :Coping with A Health Condition; Depression; Movement/ Emotional  Health Made referral to [facility]  ; initial appointment schedule Nov. 6th via Video call            Our next appointment is by telephone on 11/18/2021 at 10:00  Please call the care guide team at [phone] if you need to cancel or reschedule your appointment.   If you are experiencing a Mental Health or [place] or need someone to talk to, please call the Suicide and Crisis Lifeline: 988 call the [place] National Suicide Prevention Lifeline: 365-010-4214 or TTY: 8302579627 TTY 909-738-9030) to talk to a trained counselor call 1-800-273-TALK (toll free, 24 hour hotline)   Patient verbalizes understanding of instructions and care plan provided today and agrees to view in [place]. Active MyChart status and patient understanding of how to access instructions and care plan via MyChart confirmed with patient.     [name], [place] [phone]

## 2021-11-11 NOTE — Patient Outreach (Signed)
  Care Coordination  Follow Up Visit Note   11/11/2021 Name: Laura [name] MRN: [id number] DOB: 12-Mar-1945  Laura [name] is a 76 y.o. year old female who sees Biagio Borg, MD for primary care. I spoke with  [name] by phone today.  What matters to the patients health and wellness today?  Managing mental health  Patient continues to experience symptoms of depression which seems to be exacerbated by health conditions and being able to find her new normal..   Recommendation: Patient may benefit from, and is in agreement to Review EMMI educational information discussed today Keep appointment with RN care coordinator Nov. 1st Keep therapy appointment with Santiago Glad , therapist on Nov. 6th     Goals Addressed             This Visit's Progress    Care Coordination Activiites/ Connect for therapy       Care Coordination Interventions: Motivational Interviewing employed Solution-Focused Strategies employed:  Active listening / Reflection utilized  Emotional Support Provided Problem [place] strategies reviewed Provided general psycho-education for mental health needs  Provided EMMI education information on :Coping with A Health Condition; Depression; Movement/ Emotional  Health Made referral to [facility]  ; initial appointment schedule Nov. 6th via Video call            SDOH assessments and interventions completed:  No    Care Coordination Interventions Activated:  Yes  Care Coordination Interventions:  Yes, provided   Follow up plan: Follow up call scheduled for 11/21/2021    Encounter Outcome:  Pt. Visit Completed   [name], [place] [phone]

## 2021-11-12 ENCOUNTER — Encounter: Payer: Self-pay | Admitting: Hematology

## 2021-11-12 ENCOUNTER — Inpatient Hospital Stay: Payer: Medicare Other | Attending: Hematology | Admitting: Hematology

## 2021-11-12 ENCOUNTER — Inpatient Hospital Stay: Payer: Medicare Other

## 2021-11-12 VITALS — BP 165/69 | HR 87 | Temp 98.0°F | Resp 18 | Ht 63.0 in | Wt 188.2 lb

## 2021-11-12 DIAGNOSIS — C563 Malignant neoplasm of bilateral ovaries: Secondary | ICD-10-CM | POA: Diagnosis not present

## 2021-11-12 DIAGNOSIS — Z90710 Acquired absence of both cervix and uterus: Secondary | ICD-10-CM | POA: Insufficient documentation

## 2021-11-12 DIAGNOSIS — I129 Hypertensive chronic kidney disease with stage 1 through stage 4 chronic kidney disease, or unspecified chronic kidney disease: Secondary | ICD-10-CM | POA: Diagnosis not present

## 2021-11-12 DIAGNOSIS — N189 Chronic kidney disease, unspecified: Secondary | ICD-10-CM | POA: Diagnosis not present

## 2021-11-12 DIAGNOSIS — D649 Anemia, unspecified: Secondary | ICD-10-CM

## 2021-11-12 DIAGNOSIS — D631 Anemia in chronic kidney disease: Secondary | ICD-10-CM | POA: Insufficient documentation

## 2021-11-12 DIAGNOSIS — E1122 Type 2 diabetes mellitus with diabetic chronic kidney disease: Secondary | ICD-10-CM | POA: Diagnosis not present

## 2021-11-12 DIAGNOSIS — Z95828 Presence of other vascular implants and grafts: Secondary | ICD-10-CM

## 2021-11-12 DIAGNOSIS — Z808 Family history of malignant neoplasm of other organs or systems: Secondary | ICD-10-CM | POA: Diagnosis not present

## 2021-11-12 DIAGNOSIS — D509 Iron deficiency anemia, unspecified: Secondary | ICD-10-CM | POA: Diagnosis not present

## 2021-11-12 DIAGNOSIS — C569 Malignant neoplasm of unspecified ovary: Secondary | ICD-10-CM

## 2021-11-12 DIAGNOSIS — Z803 Family history of malignant neoplasm of breast: Secondary | ICD-10-CM | POA: Diagnosis not present

## 2021-11-12 LAB — COMPREHENSIVE METABOLIC PANEL WITH GFR
ALT: 9 U/L (ref 0–44)
AST: 17 U/L (ref 15–41)
Albumin: 4 g/dL (ref 3.5–5.0)
Alkaline Phosphatase: 55 U/L (ref 38–126)
Anion gap: 7 (ref 5–15)
BUN: 27 mg/dL — ABNORMAL HIGH (ref 8–23)
CO2: 22 mmol/L (ref 22–32)
Calcium: 9.7 mg/dL (ref 8.9–10.3)
Chloride: 112 mmol/L — ABNORMAL HIGH (ref 98–111)
Creatinine, Ser: 1.3 mg/dL — ABNORMAL HIGH (ref 0.44–1.00)
GFR, Estimated: 43 mL/min — ABNORMAL LOW
Glucose, Bld: 167 mg/dL — ABNORMAL HIGH (ref 70–99)
Potassium: 3.7 mmol/L (ref 3.5–5.1)
Sodium: 141 mmol/L (ref 135–145)
Total Bilirubin: 0.7 mg/dL (ref 0.3–1.2)
Total Protein: 7 g/dL (ref 6.5–8.1)

## 2021-11-12 LAB — CBC WITH DIFFERENTIAL/PLATELET
Abs Immature Granulocytes: 0.02 10*3/uL (ref 0.00–0.07)
Basophils Absolute: 0 10*3/uL (ref 0.0–0.1)
Basophils Relative: 1 %
Eosinophils Absolute: 0.1 10*3/uL (ref 0.0–0.5)
Eosinophils Relative: 2 %
HCT: 29.8 % — ABNORMAL LOW (ref 36.0–46.0)
Hemoglobin: 9.5 g/dL — ABNORMAL LOW (ref 12.0–15.0)
Immature Granulocytes: 1 %
Lymphocytes Relative: 24 %
Lymphs Abs: 1 10*3/uL (ref 0.7–4.0)
MCH: 29.3 pg (ref 26.0–34.0)
MCHC: 31.9 g/dL (ref 30.0–36.0)
MCV: 92 fL (ref 80.0–100.0)
Monocytes Absolute: 0.3 10*3/uL (ref 0.1–1.0)
Monocytes Relative: 7 %
Neutro Abs: 2.6 10*3/uL (ref 1.7–7.7)
Neutrophils Relative %: 65 %
Platelets: 163 10*3/uL (ref 150–400)
RBC: 3.24 MIL/uL — ABNORMAL LOW (ref 3.87–5.11)
RDW: 13.9 % (ref 11.5–15.5)
WBC: 4 10*3/uL (ref 4.0–10.5)
nRBC: 0 % (ref 0.0–0.2)

## 2021-11-12 LAB — FOLATE: Folate: 17.2 ng/mL (ref >5.9)

## 2021-11-12 LAB — IRON AND TIBC
Iron: 43 ug/dL (ref 28–170)
Saturation Ratios: 12 % (ref 10.4–31.8)
TIBC: 353 ug/dL (ref 250–450)
UIBC: 310 ug/dL

## 2021-11-12 LAB — FERRITIN: Ferritin: 48 ng/mL (ref 11–307)

## 2021-11-12 LAB — MAGNESIUM: Magnesium: 1.9 mg/dL (ref 1.7–2.4)

## 2021-11-12 LAB — VITAMIN B12: Vitamin B-12: 860 pg/mL (ref 180–914)

## 2021-11-12 MED ORDER — SODIUM CHLORIDE 0.9% FLUSH
10.0000 mL | INTRAVENOUS | Status: DC | PRN
  Administered 2021-11-12: 10 mL via INTRAVENOUS

## 2021-11-12 NOTE — Progress Notes (Signed)
Patients port flushed without difficulty.  Good blood return noted with no bruising or swelling noted at site.  Band aid applied.  VSS with discharge and left in satisfactory condition with no s/s of distress noted.   

## 2021-11-12 NOTE — Patient Instructions (Addendum)
[place] at [facility] Discharge Instructions   You were seen and examined today by Dr. Delton Coombes.  He reviewed the results of your CT scan which is stable.   He reviewed the results of your lab work which are mostly normal/stable. Your hemoglobin remains low. We will check iron studies today. We will give you iron if needed.   We will see you back in 3 months. We will repeat CT scan and labs prior to this visit.    Thank you for choosing [facility] to provide your oncology and hematology care.  To afford each patient quality time with our provider, please arrive at least 15 minutes before your scheduled appointment time.   If you have a lab appointment with the [facility] please come in thru the Main Entrance and check in at the main information desk.  You need to re-schedule your appointment should you arrive 10 or more minutes late.  We strive to give you quality time with our providers, and arriving late affects you and other patients whose appointments are after yours.  Also, if you no show three or more times for appointments you may be dismissed from the clinic at the providers discretion.     Again, thank you for choosing [facility].  Our hope is that these requests will decrease the amount of time that you wait before being seen by our physicians.       _____________________________________________________________  Should you have questions after your visit to [facility], please contact our office at [phone] and follow the prompts.  Our office hours are 8:00 a.m. and 4:30 p.m. Monday - Friday.  Please note that voicemails left after 4:00 p.m. may not be returned until the following business day.  We are closed weekends and major holidays.  You do have access to a nurse 24-7, just call the main number to the clinic [phone] and do not press any options, hold on the line and a nurse  will answer the phone.    For prescription refill requests, have your pharmacy contact our office and allow 72 hours.    Due to Covid, you will need to wear a mask upon entering the hospital. If you do not have a mask, a mask will be given to you at the Main Entrance upon arrival. For doctor visits, patients may have 1 support person age 56 or older with them. For treatment visits, patients can not have anyone with them due to social distancing guidelines and our immunocompromised population.
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
## 2021-11-13 ENCOUNTER — Ambulatory Visit: Payer: Self-pay

## 2021-11-13 ENCOUNTER — Telehealth: Payer: Self-pay

## 2021-11-13 DIAGNOSIS — E119 Type 2 diabetes mellitus without complications: Secondary | ICD-10-CM

## 2021-11-13 LAB — CA 125: Cancer Antigen (CA) 125: 9.2 U/mL (ref 0.0–38.1)

## 2021-11-13 NOTE — Telephone Encounter (Signed)
   Telephone encounter was:  Successful.  11/13/2021 Name: LORRAINA SPRING MRN: [id number] DOB: 02-Jun-1945  Laura [name] is a 76 y.o. year old female who is a primary care patient of Jenny Reichmann, Hunt Oris, MD . The community resource team was consulted for assistance with  bed bugs  Care guide performed the following interventions: Patient provided with information about care guide support team and interviewed to confirm resource needs.   Follow Up Plan:  Care guide will follow up with patient by phone over the next day    [place]  [phone] [address], [place], [place] [address] Phone: [phone] Email: [email or web address]

## 2021-11-13 NOTE — Patient Instructions (Signed)
Visit Information  Thank you for taking time to visit with me today. Please don't hesitate to contact me if I can be of assistance to you.   Following are the goals we discussed today:   Goals Addressed             This Visit's Progress    Health Management Community resources       Care Coordination Interventions: Active listening Offered contact information to patient experience Encouraged to continue to eat Healthy Discussed with patient that care guide attempted to reach patient unsuccessfully. Re-Referral to care guide for community resource.       Our next appointment is by telephone on 12/13/21 at 11 am.  Please call the care guide team at [phone] if you need to cancel or reschedule your appointment.   If you are experiencing a Mental Health or [place] or need someone to talk to, please call the Suicide and Crisis Lifeline: 988  Patient verbalizes understanding of instructions and care plan provided today and agrees to view in [place]. Active MyChart status and patient understanding of how to access instructions and care plan via MyChart confirmed with patient.     Thea Silversmith, RN, MSN, BSN, [place] Coordinator [phone]

## 2021-11-13 NOTE — Patient Outreach (Signed)
  Care Coordination   Follow Up Visit Note   11/13/2021 Name: Laura [name] MRN: [id number] DOB: [birth year]  Laura [name] is a 76 y.o. year old female who sees Laura Borg, MD for primary care. I spoke with  Laura [name] by phone today.  What matters to the patients health and wellness today?  She reports Left leg/ankle swelling and reports there is concern for blood clot formation.  She expresses frustration with difficulty reaching urologist office initially as she thought it may have been related to urological procedure done 09/2021. She reports primary care visit on 11/07/21. Venous doppler scheduled on tomorrow 11/14/21. Per review of chart, care guide was unable to reach patient to provide community resources   Goals Addressed             This Visit's Progress    Health Management Community resources       Care Coordination Interventions: Active listening Offered contact information to patient experience Encouraged to continue to eat Healthy Discussed with patient that care guide attempted to reach patient unsuccessfully. Re-Referral to care guide for community resource.  Upcoming/scheduled appointments reviewed      SDOH assessments and interventions completed:  Yes  SDOH Interventions Today    Flowsheet Row Most Recent Value  SDOH Interventions   Utilities Interventions Intervention Not Indicated     Care Coordination Interventions Activated:  Yes  Care Coordination Interventions:  Yes, provided   Follow up plan: Follow up call scheduled for 12/13/21    Encounter Outcome:  Pt. Visit Completed   Laura Silversmith, RN, MSN, BSN, [place] Coordinator [phone]

## 2021-11-14 ENCOUNTER — Ambulatory Visit: Payer: Medicare Other | Attending: Internal Medicine

## 2021-11-14 ENCOUNTER — Telehealth: Payer: Self-pay

## 2021-11-14 DIAGNOSIS — M79662 Pain in left lower leg: Secondary | ICD-10-CM | POA: Diagnosis not present

## 2021-11-14 DIAGNOSIS — M7989 Other specified soft tissue disorders: Secondary | ICD-10-CM | POA: Diagnosis not present

## 2021-11-18 ENCOUNTER — Ambulatory Visit: Payer: Medicare Other | Admitting: Clinical

## 2021-11-21 ENCOUNTER — Ambulatory Visit (INDEPENDENT_AMBULATORY_CARE_PROVIDER_SITE_OTHER): Payer: Medicare Other | Admitting: Internal Medicine

## 2021-11-21 ENCOUNTER — Ambulatory Visit: Payer: Self-pay | Admitting: Licensed Clinical Social Worker

## 2021-11-21 ENCOUNTER — Encounter: Payer: Self-pay | Admitting: Internal Medicine

## 2021-11-21 VITALS — BP 130/72 | HR 77 | Ht 63.0 in | Wt 185.0 lb

## 2021-11-21 DIAGNOSIS — I10 Essential (primary) hypertension: Secondary | ICD-10-CM

## 2021-11-21 DIAGNOSIS — C772 Secondary and unspecified malignant neoplasm of intra-abdominal lymph nodes: Secondary | ICD-10-CM

## 2021-11-21 DIAGNOSIS — Z23 Encounter for immunization: Secondary | ICD-10-CM

## 2021-11-21 DIAGNOSIS — C563 Malignant neoplasm of bilateral ovaries: Secondary | ICD-10-CM

## 2021-11-21 DIAGNOSIS — R1900 Intra-abdominal and pelvic swelling, mass and lump, unspecified site: Secondary | ICD-10-CM

## 2021-11-21 NOTE — Patient Instructions (Signed)
You will be contacted regarding the referral for: CT scan  Please continue all other medications as before, and refills have been done if requested.  Please have the pharmacy call with any other refills you may need.  Please keep your appointments with your specialists as you may have planned - Oncology (though you may need to be seen sooner if the CT scan is worsening)

## 2021-11-21 NOTE — Progress Notes (Signed)
Patient ID: Laura [name], female   DOB: 04/10/1945, 76 y.o.   MRN: [id number]        Chief Complaint: follow up worsening left whole leg pain and swelling, neg for DVT       HPI:  Laura [name] is a 76 y.o. female here with c/o worsening whole left leg pain and swelling, especially sore to touch even at the medial left thigh to jsut above the knee.  Recently seen with same, sent for venous doppler - negative.  But pt now with increased persistent swelling and pain that is not improved with lying down at night as before.  Has known hx of retroperitoneal mass with some encasement of the IVC, last CT sept 2023 felt to be overall stable at that time.  Next CT scheduled jan 2024 with oncology f/u.         Wt Readings from Last 3 Encounters:  11/21/21 185 lb (83.9 kg)  11/12/21 188 lb 3.2 oz (85.4 kg)  11/07/21 186 lb (84.4 kg)   BP Readings from Last 3 Encounters:  11/21/21 130/72  11/12/21 (!) 165/69  11/07/21 138/80         Past Medical History:  Diagnosis Date   Anemia    Anxiety    Arthritis    back of neck, bones spurs on neck   Cancer (HCC)    ovarian cancer   Cataract both eyes    Cervical disc disease    Diabetes mellitus Type 2    Family history of adverse reaction to anesthesia    sister slow to awaken   Family history of thyroid cancer    GERD (gastroesophageal reflux disease)    Heart murmur    History of COVID-19 06/2020   pain in side took paxlovid x 5 days all symptoms resolved   History of radiation therapy    abdominal lymph nodes 01/21/2021-02/01/2021  Dr  Kinard   Hydronephrosis 08/24/2020   Right Kidney (stable per CT)   Hyperlipidemia    Hypertension    IBS (irritable bowel syndrome)    Left shoulder frozen with limited rom    Mucoid cyst of joint    right thumb   Neuropathy 03/12/2021   hands/fingers both hands numb and tingle @ times   Port-A-Cath in place 07/21/2019   Reflux    Sleep apnea 03/12/2021   has not used cpap in 3 years   Vertigo  12/2010   none since treated at [facility]   Wears glasses for reading    Wears partial dentures upper    Past Surgical History:  Procedure Laterality Date   BLADDER SURGERY     x 3 at [facility]   BREAST EXCISIONAL BIOPSY Bilateral    BREAST SURGERY     fibroid cyst removed ? over 10 yrs ago at [facility] per pt on 03-12-2021   CHOLECYSTECTOMY     yrs ago   COLONOSCOPY  07/02/2020   and 09-19-2016   CYSTOSCOPY W/ URETERAL STENT PLACEMENT Right 03/13/2021   Procedure: CYSTOSCOPY WITH RETROGRADE PYELOGRAM/URETERAL STENT PLACEMENT;  Surgeon: Pace, Maryellen D, MD;  Location: [facility];  Service: Urology;  Laterality: Right;  30 MINS   CYSTOSCOPY W/ URETERAL STENT PLACEMENT Right 10/01/2021   Procedure: CYSTOSCOPY WITH RETROGRADE PYELOGRAM/URETERAL STENT REPLACEMENT;  Surgeon: Pace, Maryellen D, MD;  Location:  SURGERY CENTER;  Service: Urology;  Laterality: Right;   fibroids removed     breast (both breasts) age 15 and total   of 3 surgeries   history of chemotherapy     6 rounds june 2021   IR IMAGING GUIDED PORT INSERTION  07/26/2019   right   MASS EXCISION Right 06/26/2016   Procedure: EXCISION MUCOID TUMOR RIGHT THUMB, IP RIGHT THUMB;  Surgeon: Daryll Brod, MD;  Location: [facility];  Service: Orthopedics;  Laterality: Right;   ROBOTIC ASSISTED BILATERAL SALPINGO OOPHERECTOMY N/A 01/03/2020   Procedure: XI ROBOTIC ASSISTED BILATERAL SALPINGO OOPHORECTOMY, RADICAL TUMOR DEBULKING;  Surgeon: Everitt Amber, MD;  Location: [facility];  Service: Gynecology;  Laterality: N/A;   ROBOTIC PELVIC AND PARA-AORTIC LYMPH NODE DISSECTION N/A 01/03/2020   Procedure: XI ROBOTIC PARA-AORTIC LYMPHADENECTOMY;  Surgeon: Everitt Amber, MD;  Location: [facility];  Service: Gynecology;  Laterality: N/A;   VAGINAL HYSTERECTOMY     age late 34's    reports that she has never smoked. She has never used smokeless tobacco. She reports that she does not drink alcohol and does not use drugs. family  history includes Breast cancer in her sister; Diabetes in her father, maternal aunt, and sister; Hypertension in her father, mother, sister, and another family member; Stroke in her sister; Thyroid cancer (age of onset: 17) in her daughter. Allergies  Allergen Reactions   Ciprofloxacin Swelling    Torn tendon   Hydrocodone Bit-Homatrop Mbr Other (See Comments)    Vertigo *pt strongly prefers to never take* took 3 years to recover from   Sulfa Antibiotics Hives, Itching and Swelling    Tongue swells   Alfuzosin Other (See Comments)    Weakness and fatigue   Codeine Itching   Crestor [Rosuvastatin Calcium] Other (See Comments)    Did something to memory     Doxycycline Other (See Comments)    Severe rectal Gas.   Gabapentin     disoriented   Keflex [Cephalexin] Diarrhea and Nausea And Vomiting   Myrbetriq [place]ree [place]] Swelling    Swelling of legs and feet   Naproxen Other (See Comments)    Stomach cramps/loud gas   Statins     Muscle weakness   Prednisone Anxiety    *pt strongly prefers to never be given prednisone*    Current Outpatient Medications on File Prior to Visit  Medication Sig Dispense Refill   acetaminophen (TYLENOL) 500 MG tablet Take 1 tablet (500 mg total) by mouth every 6 (six) hours as needed. 30 tablet 0   amLODipine (NORVASC) 5 MG tablet TAKE 1 TABLET (5 MG TOTAL) BY MOUTH DAILY. 90 tablet 3   Bismuth Subsalicylate (PEPTO-BISMOL MAX STRENGTH PO) Take by mouth as needed.     Blood Glucose Monitoring Suppl (ONE TOUCH ULTRA 2) w/Device KIT Use as directed 1 each 0   Cholecalciferol (VITAMIN D3) 50 MCG (2000 UT) TABS Take by mouth.     DM-APAP-CPM (CORICIDIN HBP) 10-325-2 MG TABS Take 1 tablet by mouth daily as needed.     Famotidine (PEPCID AC PO) Take by mouth as needed.     famotidine-calcium carbonate-magnesium hydroxide (PEPCID COMPLETE) 10-800-165 MG chewable tablet      feeding supplement (BOOST HIGH PROTEIN) LIQD Take 237 mLs by mouth daily.      glimepiride (AMARYL) 2 MG tablet Take 0.5 tablets (1 mg total) by mouth daily before breakfast. 1/2 of 2 mg tab 90 tablet 3   glucose blood (ONETOUCH ULTRA) test strip USE TO CHECK BLOOD SUGARS TWO TIMES DAILY 100 strip 3   Lancets (ONETOUCH ULTRASOFT) lancets 1 each by Other route as needed for  other. Use as instructed 100 each 3   loratadine (CLARITIN) 10 MG tablet Take 10 mg by mouth daily as needed (allergies.).     LORazepam (ATIVAN) 0.5 MG tablet Take 1 tablet (0.5 mg total) by mouth 2 (two) times daily as needed for anxiety. 60 tablet 0   Metamucil Fiber CHEW Chew by mouth.     traMADol (ULTRAM) 50 MG tablet Take 1 tablet by mouth every 6 hours as needed for severe pain 15 tablet 0   vitamin B-12 (CYANOCOBALAMIN) 1000 MCG tablet Take 1,000 mcg by mouth daily.      No current facility-administered medications on file prior to visit.        ROS:  All others reviewed and negative.  Objective        PE:  BP 130/72 (BP Location: Left Arm, Patient Position: Sitting, Cuff Size: Large)   Pulse 77   Ht 5' 3" (1.6 m)   Wt 185 lb (83.9 kg)   SpO2 96%   BMI 32.77 kg/m                 Constitutional: Pt appears in NAD               HENT: Head: NCAT.                Right Ear: External ear normal.                 Left Ear: External ear normal.                Eyes: . Pupils are equal, round, and reactive to light. Conjunctivae and EOM are normal               Nose: without d/c or deformity               Neck: Neck supple. Gross normal ROM               Cardiovascular: Normal rate and regular rhythm.                 Pulmonary/Chest: Effort normal and breath sounds without rales or wheezing.                Abd:  Soft, NT, ND, + BS, no organomegaly               Neurological: Pt is alert. At baseline orientation, motor grossly intact               Skin: Skin is warm. No rashes, no other new lesions, LE edema - 2-3+ LLE swelling without skin change, mild tender noted medial left upper leg but no  cords               Psychiatric: Pt behavior is normal without agitation   Micro: none  Cardiac tracings I have personally interpreted today:  none  Pertinent Radiological findings (summarize): none   Lab Results  Component Value Date   WBC 4.0 11/12/2021   HGB 9.5 (L) 11/12/2021   HCT 29.8 (L) 11/12/2021   PLT 163 11/12/2021   GLUCOSE 167 (H) 11/12/2021   CHOL 237 (H) 03/06/2021   TRIG 169.0 (H) 03/06/2021   HDL 49.20 03/06/2021   LDLDIRECT 172.0 02/28/2020   LDLCALC 154 (H) 03/06/2021   ALT 9 11/12/2021   AST 17 11/12/2021   NA 141 11/12/2021   K 3.7 11/12/2021   CL 112 (H) 11/12/2021   CREATININE 1.30 (H) 11/12/2021     BUN 27 (H) 11/12/2021   CO2 22 11/12/2021   TSH 1.38 03/06/2021   INR 1.0 06/06/2019   HGBA1C 6.2 (A) 08/07/2021   MICROALBUR 2.1 (H) 03/06/2021   Assessment/Plan:  Roshelle R Whittaker is a 76 y.o. Black or African American [2] female with  has a past medical history of Anemia, Anxiety, Arthritis, Cancer (HCC), Cataract both eyes, Cervical disc disease, Diabetes mellitus Type 2, Family history of adverse reaction to anesthesia, Family history of thyroid cancer, GERD (gastroesophageal reflux disease), Heart murmur, History of COVID-19 (06/2020), History of radiation therapy, Hydronephrosis (08/24/2020), Hyperlipidemia, Hypertension, IBS (irritable bowel syndrome), Left shoulder frozen with limited rom, Mucoid cyst of joint, Neuropathy (03/12/2021), Port-A-Cath in place (07/21/2019), Reflux, Sleep apnea (03/12/2021), Vertigo (12/2010), Wears glasses for reading, and Wears partial dentures upper.  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   , MD 11/21/2021 2:39 PM [facility] 

## 2021-11-21 NOTE — Assessment & Plan Note (Signed)
Partially necrotic, felt to be overall stable by ct sept 2023

## 2021-11-21 NOTE — Assessment & Plan Note (Signed)
BP Readings from Last 3 Encounters:  11/21/21 130/72  11/12/21 (!) 165/69  11/07/21 138/80   Stable, pt to continue medical treatment norvasc 5 mg qd

## 2021-11-21 NOTE — Addendum Note (Signed)
Addended by: Max Sane on: 11/21/2021 03:57 PM   Modules accepted: Orders

## 2021-11-21 NOTE — Patient Outreach (Signed)
  Care Coordination  Follow Up Visit Note   11/21/2021 Name: Laura [name] MRN: [id number] DOB: 07/10/1945  Laura [name] is a 76 y.o. year old female who sees Biagio Borg, MD for primary care. I spoke with  [name] by phone today.  What matters to the patients health and wellness today?    Patient is making progress with managing her symptoms of anxiety and stress  LCSW  Reminded patient of all up coming appointments.   Recommendation: Patient may benefit from, and is in agreement to Keep therapy appointment with [facility] 11/20. Continue to implement therapeutic interventions discussed today Relaxed breathing; and Movement      Goals Addressed             This Visit's Progress    Care Coordination Activiites/ Connect for therapy       Care Coordination Interventions: Motivational Interviewing employed Solution-Focused Strategies employed:  Optician, dispensing provided Active listening / Reflection utilized  Veterinary surgeon reviewed Problem [place] strategies reviewed Participation in counseling encouraged : has appointment with [facility] Nov. 20th Provided EMMI education information on : Movement/ Emotional  Health  & Breathing to Relax Look for E-mail from [employer]        SDOH assessments and interventions completed:  No    Care Coordination Interventions Activated:  Yes  Care Coordination Interventions:  Yes, provided   Follow up plan: Follow up call scheduled for 12/04/21    Encounter Outcome:  Pt. Visit Completed   [name], [employer] [phone]

## 2021-11-21 NOTE — Assessment & Plan Note (Signed)
Pt to f/u oncology as planned for now

## 2021-11-21 NOTE — Patient Instructions (Signed)
Visit Information  Thank you for taking time to visit with me today. Please don't hesitate to contact me if I can be of assistance to you.   Following are the goals we discussed today:   Goals Addressed             This Visit's Progress    Care Coordination Activiites/ Connect for therapy       Care Coordination Interventions: Motivational Interviewing employed Solution-Focused Strategies employed:  Optician, dispensing provided Active listening / Reflection utilized  Veterinary surgeon reviewed Problem [place] strategies reviewed Participation in counseling encouraged : has appointment with [facility] Nov. 20th Provided EMMI education information on : Movement/ Emotional  Health  & Breathing to Relax Look for E-mail from [employer] next appointment is by telephone on 12/02/21 at 10:30  Please call the care guide team at [phone] if you need to cancel or reschedule your appointment.   If you are experiencing a Mental Health or [place] or need someone to talk to, please call the Suicide and Crisis Lifeline: 988 call the [place] National Suicide Prevention Lifeline: 530-351-6355 or TTY: 727 480 3865 TTY 9840140445) to talk to a trained counselor call 1-800-273-TALK (toll free, 24 hour hotline)   Patient verbalizes understanding of instructions and care plan provided today and agrees to view in [place]. Active MyChart status and patient understanding of how to access instructions and care plan via MyChart confirmed with patient.     [name], [place] [phone]

## 2021-11-21 NOTE — Assessment & Plan Note (Signed)
I suspect worsening malignancy leading to worsening IVC encasement and LLE edema; will need CT abd/pelvis w CM now, and f/u with oncology if proves worsening

## 2021-11-22 ENCOUNTER — Ambulatory Visit
Admission: RE | Admit: 2021-11-22 | Discharge: 2021-11-22 | Disposition: A | Discharge status: Home / Self Care | Payer: Medicare Other | Source: Ambulatory Visit | Attending: Internal Medicine | Admitting: Internal Medicine

## 2021-11-22 DIAGNOSIS — N1330 Unspecified hydronephrosis: Secondary | ICD-10-CM | POA: Diagnosis not present

## 2021-11-22 DIAGNOSIS — K3189 Other diseases of stomach and duodenum: Secondary | ICD-10-CM | POA: Diagnosis not present

## 2021-11-22 DIAGNOSIS — C772 Secondary and unspecified malignant neoplasm of intra-abdominal lymph nodes: Secondary | ICD-10-CM

## 2021-11-22 DIAGNOSIS — C563 Malignant neoplasm of bilateral ovaries: Secondary | ICD-10-CM

## 2021-11-22 DIAGNOSIS — K449 Diaphragmatic hernia without obstruction or gangrene: Secondary | ICD-10-CM | POA: Diagnosis not present

## 2021-11-22 DIAGNOSIS — R1900 Intra-abdominal and pelvic swelling, mass and lump, unspecified site: Secondary | ICD-10-CM

## 2021-11-22 MED ORDER — IOPAMIDOL (ISOVUE-300) INJECTION 61%
80.0000 mL | Freq: Once | INTRAVENOUS | Status: AC | PRN
  Administered 2021-11-22: 80 mL via INTRAVENOUS

## 2021-11-25 ENCOUNTER — Telehealth: Payer: Self-pay | Admitting: *Deleted

## 2021-11-25 NOTE — Telephone Encounter (Signed)
Patient consulted with PCP regarding continued L ankle swelling. He sent her to [place] and she had [place] which was negative, however advised that she follow up with Dr. Delton Coombes.  Will forward to Dr. Delton Coombes to assess and give recommendations.

## 2021-11-25 NOTE — Telephone Encounter (Signed)
Spoke with Dr. Delton Coombes, who has reviewed both Ultrasound and CT of abdomen and pelvis were stable and did not show any concerns.  Advised her that if she felt she would like to follow up with Dr. Delton Coombes, we would be glad to see her sooner than planned.  States she will call if needed.

## 2021-12-02 ENCOUNTER — Ambulatory Visit: Payer: Medicare Other | Admitting: Clinical

## 2021-12-03 ENCOUNTER — Encounter: Payer: Self-pay | Admitting: Internal Medicine

## 2021-12-04 ENCOUNTER — Telehealth: Payer: Self-pay

## 2021-12-04 ENCOUNTER — Ambulatory Visit: Payer: Self-pay | Admitting: Licensed Clinical Social Worker

## 2021-12-04 NOTE — Telephone Encounter (Signed)
Re mailing [employer]  [phone] [address], [place], [place] [address] Phone: [phone] Email: [email or web address]

## 2021-12-04 NOTE — Patient Instructions (Signed)
Visit Information  Thank you for taking time to visit with me today. Please don't hesitate to contact me if I can be of assistance to you.   Following are the goals we discussed today:   Goals Addressed             This Visit's Progress    Care Coordination Activiites Reduce symptoms of anxiety       Care Coordination Interventions: Motivational Interviewing employed Solution-Focused Strategies employed:  Optician, dispensing provided Active listening / Reflection utilized  Veterinary surgeon reviewed Problem [place] strategies reviewed Participation in counseling encouraged : Discussed barriers with connecting for therapy; She has decided not to move forward Brief CBT Provided EMMI education information on :Insomnia and getting a good night's sleep; Managing Anxiety around daily task Collaborated with Care Guide about lost resources and requested information again Reminded of all upcoming appointments in EP[facility]        Our next appointment is by telephone on 12/18/21 at 10:30  Please call the care guide team at [phone] if you need to cancel or reschedule your appointment.   If you are experiencing a Mental Health or [place] or need someone to talk to, please call the Suicide and Crisis Lifeline: 988 call the [place] National Suicide Prevention Lifeline: 782-155-8764 or TTY: (608)029-0461 TTY 432-879-7608) to talk to a trained counselor call 1-800-273-TALK (toll free, 24 hour hotline)   Patient verbalizes understanding of instructions and care plan provided today and agrees to view in [place]. Active MyChart status and patient understanding of how to access instructions and care plan via MyChart confirmed with patient.     [name], [place] [phone]

## 2021-12-04 NOTE — Patient Outreach (Signed)
  Care Coordination  Follow Up Visit Note   12/04/2021 Name: Laura [name] MRN: [id number] DOB: 1945-04-03  Laura [name] is a 76 y.o. year old female who sees Biagio Borg, MD for primary care. I spoke with  Laura [name] by phone today.  What matters to the patients health and wellness today?    Patient is making progress with managing symptoms of anxiety, She has implemented interventions discussed during previous encounters.   Was unable to connect with her therapy appointment on Nov. 20th after making several calls and attempt. Has decided not to move forward .  Reports she has been going out more due to no longer being short of breath and improved strength   Recommendation: Patient may benefit from, and is in agreement to to implement interventions discussed today.  And continue with relaxed breathing and behavioral activation    Goals Addressed             This Visit's Progress    Care Coordination Activiites Reduce symptoms of anxiety       Care Coordination Interventions: Motivational Interviewing employed Solution-Focused Strategies employed:  Optician, dispensing provided Active listening / Reflection utilized  Veterinary surgeon reviewed Problem [place] strategies reviewed Participation in counseling encouraged : Discussed barriers with connecting for therapy; She has decided not to move forward Brief CBT Provided EMMI education information on :Insomnia and getting a good night's sleep; Managing Anxiety around daily task Collaborated with Care Guide about lost resources and requested information again Reminded of all upcoming appointments in EP[facility]        SDOH assessments and interventions completed:  No   Care Coordination Interventions Activated:  Yes  Care Coordination Interventions:  Yes, provided   Follow up plan: Follow up call scheduled for 12/13/21  with RN care coordinator and 12/18/21 with LCSW Care Coordinator     Encounter Outcome:  Pt. Visit Completed   [name], [place] [phone]

## 2021-12-09 NOTE — Telephone Encounter (Signed)
Patient would like to be referred to a doctor about her leg.

## 2021-12-10 ENCOUNTER — Telehealth: Payer: Self-pay | Admitting: Internal Medicine

## 2021-12-10 NOTE — Telephone Encounter (Signed)
Unfortunately I dont have anything else to offer as this appears to be related to her cancer in the abdomen.  I can say wear compression stocking in the daytime only, and also f/u with oncology as she has planned

## 2021-12-10 NOTE — Telephone Encounter (Signed)
Patient called about her ankle problems - she wants to know if there is anything else that Dr. Jenny Reichmann can suggest.  Please advise.

## 2021-12-10 NOTE — Telephone Encounter (Signed)
Patient is requesting referral for leg swelling, please advise is she requires an OV first

## 2021-12-12 NOTE — Telephone Encounter (Signed)
I think she refers to the leg sweling worse at the ankle.  I dont have anything else to offer at this time beyond what has already been discussed.  Please be sure to follow up with oncology as she has planned

## 2021-12-13 ENCOUNTER — Ambulatory Visit: Payer: Self-pay

## 2021-12-13 NOTE — Telephone Encounter (Signed)
I was able to inform the pt of Dr. Gwynn Burly advice. Pt understood and has no questions or concerns at this time.

## 2021-12-13 NOTE — Patient Instructions (Addendum)
Visit Information  Thank you for taking time to visit with me today. Please don't hesitate to contact me if I can be of assistance to you.   Following are the goals we discussed today:   Goals Addressed             This Visit's Progress    Health Management Community resources       Care Coordination Interventions: Confirmed care guide has been in contact-resources mailed Active listening Encouraged to continue to keep appointment with LCSW and reestablish appointment with therapist for long term management Encouraged patient to utiilize managing anxiety techniques implemented by LCSW Encouraged patient to take medications as prescribed Provided education: anxiety RNCM will update LCSW      Our next appointment is by telephone on 12/24/21 at 10:30 am  Please call the care guide team at [phone] if you need to cancel or reschedule your appointment.   If you are experiencing a Mental Health or [place] or need someone to talk to, please call the Suicide and Crisis Lifeline: 988  Patient verbalizes understanding of instructions and care plan provided today and agrees to view in [place]. Active MyChart status and patient understanding of how to access instructions and care plan via MyChart confirmed with patient.     Thea Silversmith, RN, MSN, BSN, CCM [facility] Care Coordinator [phone]    Managing Anxiety, Adult After being diagnosed with anxiety, you may be relieved to know why you have felt or behaved a certain way. You may also feel overwhelmed about the treatment ahead and what it will mean for your life. With care and support, you can manage this condition. How to manage lifestyle changes Managing stress and anxiety  Stress is your body's reaction to life changes and events, both good and bad. Most stress will last just a few hours, but stress can be ongoing and can lead to more than just stress. Although stress can play a major role in anxiety, it is not  the same as anxiety. Stress is usually caused by something external, such as a deadline, test, or competition. Stress normally passes after the triggering event has ended.  Anxiety is caused by something internal, such as imagining a terrible outcome or worrying that something will go wrong that will devastate you. Anxiety often does not go away even after the triggering event is over, and it can become long-term (chronic) worry. It is important to understand the differences between stress and anxiety and to manage your stress effectively so that it does not lead to an anxious response. Talk with your health care provider or a counselor to learn more about reducing anxiety and stress. He or she may suggest tension reduction techniques, such as: Music therapy. Spend time creating or listening to music that you enjoy and that inspires you. Mindfulness-based meditation. Practice being aware of your normal breaths while not trying to control your breathing. It can be done while sitting or walking. Centering prayer. This involves focusing on a word, phrase, or sacred image that means something to you and brings you peace. Deep breathing. To do this, expand your stomach and inhale slowly through your nose. Hold your breath for 3-5 seconds. Then exhale slowly, letting your stomach muscles relax. Self-talk. Learn to notice and identify thought patterns that lead to anxiety reactions and change those patterns to thoughts that feel peaceful. Muscle relaxation. Taking time to tense muscles and then relax them. Choose a tension reduction technique that fits your lifestyle and  personality. These techniques take time and practice. Set aside 5-15 minutes a day to do them. Therapists can offer counseling and training in these techniques. The training to help with anxiety may be covered by some insurance plans. Other things you can do to manage stress and anxiety include: Keeping a stress diary. This can help you learn  what triggers your reaction and then learn ways to manage your response. Thinking about how you react to certain situations. You may not be able to control everything, but you can control your response. Making time for activities that help you relax and not feeling guilty about spending your time in this way. Doing visual imagery. This involves imagining or creating mental pictures to help you relax. Practicing yoga. Through yoga poses, you can lower tension and promote relaxation.  Medicines Medicines can help ease symptoms. Medicines for anxiety include: Antidepressant medicines. These are usually prescribed for long-term daily control. Anti-anxiety medicines. These may be added in severe cases, especially when panic attacks occur. Medicines will be prescribed by a health care provider. When used together, medicines, psychotherapy, and tension reduction techniques may be the most effective treatment. Relationships Relationships can play a big part in helping you recover. Try to spend more time connecting with trusted friends and family members. Consider going to couples counseling if you have a partner, taking family education classes, or going to family therapy. Therapy can help you and others better understand your condition. How to recognize changes in your anxiety Everyone responds differently to treatment for anxiety. Recovery from anxiety happens when symptoms decrease and stop interfering with your daily activities at home or work. This may mean that you will start to: Have better concentration and focus. Worry will interfere less in your daily thinking. Sleep better. Be less irritable. Have more energy. Have improved memory. It is also important to recognize when your condition is getting worse. Contact your health care provider if your symptoms interfere with home or work and you feel like your condition is not improving. Follow these instructions at home: Activity Exercise.  Adults should do the following: Exercise for at least 150 minutes each week. The exercise should increase your heart rate and make you sweat (moderate-intensity exercise). Strengthening exercises at least twice a week. Get the right amount and quality of sleep. Most adults need 7-9 hours of sleep each night. Lifestyle  Eat a healthy diet that includes plenty of vegetables, fruits, whole grains, low-fat dairy products, and lean protein. Do not eat a lot of foods that are high in fats, added sugars, or salt (sodium). Make choices that simplify your life. Do not use any products that contain nicotine or tobacco. These products include cigarettes, chewing tobacco, and vaping devices, such as e-cigarettes. If you need help quitting, ask your health care provider. Avoid caffeine, alcohol, and certain over-the-counter cold medicines. These may make you feel worse. Ask your pharmacist which medicines to avoid. General instructions Take over-the-counter and prescription medicines only as told by your health care provider. Keep all follow-up visits. This is important. Where to find support You can get help and support from these sources: Self-help groups. Online and [employer]. A trusted spiritual leader. Couples counseling. Family education classes. Family therapy. Where to find more information You may find that joining a support group helps you deal with your anxiety. The following sources can help you locate counselors or support groups near you: [place]: www.mentalhealthamerica.net Anxiety and Depression Association of [place] (ADAA): https://www.clark.net/ National Alliance on  Mental Illness (NAMI): www.nami.org Contact a health care provider if: You have a hard time staying focused or finishing daily tasks. You spend many hours a day feeling worried about everyday life. You become exhausted by worry. You start to have headaches or frequently feel tense. You develop  chronic nausea or diarrhea. Get help right away if: You have a racing heart and shortness of breath. You have thoughts of hurting yourself or others. If you ever feel like you may hurt yourself or others, or have thoughts about taking your own life, get help right away. Go to your nearest emergency department or: Call your local emergency services (911 in the [place] Call a suicide crisis helpline, such as the [place] at (380)462-7938 or 988 in the [place]. This is open 24 hours a day in the [place]. Text the Crisis Text Line at 415-348-8132 (in the [place].). Summary Taking steps to learn and use tension reduction techniques can help calm you and help prevent triggering an anxiety reaction. When used together, medicines, psychotherapy, and tension reduction techniques may be the most effective treatment. Family, friends, and partners can play a big part in supporting you. This information is not intended to replace advice given to you by your health care provider. Make sure you discuss any questions you have with your health care provider. Document Revised: 07/25/2020 Document Reviewed: 04/22/2020 Elsevier Patient Education  [place].

## 2021-12-13 NOTE — Patient Outreach (Signed)
  Care Coordination   Follow Up Visit Note   12/13/2021 Name: Laura [name] MRN: [id number] DOB: 1945/07/30  Laura [name] is a 76 y.o. year old female who sees Laura Borg, MD for primary care. I spoke with  Laura [name] by phone today.  What matters to the patients health and wellness today?  RNCM spoke with patient today. She expresses concern that she continues to have swelling to ankle leg. She reports she has communicated with primary care and also communicated with oncologist. She reports the oncologist recommended a test that she has already had. She acknowledges that she has some anxiety because she was told in the past that a blood clot formation could happen. She continues to have concerns about what could be causing the swelling of her ankle/foot. She states she thinks it will help if she has a face to face visit with provider to review her concerns and treatment.   Goals Addressed             This Visit's Progress    Health Management Community resources       Care Coordination Interventions: Confirmed care guide has been in contact-resources mailed Active listening Encouraged to continue to keep appointment with LCSW and reestablish appointment with therapist for long term management Encouraged patient to utiilize managing anxiety techniques implemented by LCSW Encouraged patient to take medications as prescribed Provided education: anxiety RNCM will update LCSW      SDOH assessments and interventions completed:  No  Care Coordination Interventions:  Yes, provided   Follow up plan: Follow up call scheduled for 12/24/21    Encounter Outcome:  Pt. Visit Completed   Laura Silversmith, RN, MSN, BSN, [place] Coordinator [phone]

## 2021-12-16 ENCOUNTER — Ambulatory Visit: Payer: Medicare Other | Admitting: Internal Medicine

## 2021-12-18 ENCOUNTER — Ambulatory Visit: Payer: Self-pay | Admitting: Licensed Clinical Social Worker

## 2021-12-18 NOTE — Patient Outreach (Signed)
  Care Coordination  Follow Up Visit Note   12/18/2021 Name: ZENITA KISTER MRN: [id number] DOB: 08-Feb-1945  LAASIA ARCOS is a 76 y.o. year old female who sees Biagio Borg, MD for primary care. I spoke with  [name] by phone today.  What matters to the patients health and wellness today?    Patient continues to experience symptoms of anxiety which seems to be exacerbated by her physical health.  Recommendation: Patient may benefit from, and is in agreement to :  Review EMMI education provided Continue with Relaxed breathing Continue to attend Cancer Support Groups And reconsider going to therapy    Goals Addressed             This Visit's Progress    Care Coordination Activiites Reduce symptoms of anxiety       Care Coordination Interventions: Motivational Interviewing employed Solution-Focused Strategies employed:  Optician, dispensing provided Active listening / Reflection utilized  Veterinary surgeon reviewed Problem [place] strategies reviewed Participation in counseling encouraged : Discussed barriers with connecting for therapy;  Brief CBT Provided EMMI education information on :Anxiety and reviewed previous education  Suicidal Ideation/Homicidal Ideation assessed: denied Reminded of all upcoming appointments in EPIC        SDOH assessments and interventions completed:  No   Care Coordination Interventions:  Yes, provided   Follow up plan: Follow up call scheduled for 12/31/21    Encounter Outcome:  Pt. Visit Completed   [name], [place] [phone]

## 2021-12-18 NOTE — Patient Instructions (Signed)
Visit Information  Thank you for taking time to visit with me today. Please don't hesitate to contact me if I can be of assistance to you.   Following are the goals we discussed today:   Goals Addressed             This Visit's Progress    Care Coordination Activiites Reduce symptoms of anxiety       Care Coordination Interventions: Motivational Interviewing employed Solution-Focused Strategies employed:  Optician, dispensing provided Active listening / Reflection utilized  Veterinary surgeon reviewed Problem [place] strategies reviewed Participation in counseling encouraged : Discussed barriers with connecting for therapy;  Brief CBT Provided EMMI education information on :Anxiety and reviewed previous education  Reminded of all upcoming appointments in EPIC        Our next appointment is by telephone on 12/31/21 at 10:00  Please call the care guide team at [phone] if you need to cancel or reschedule your appointment.   If you are experiencing a Mental Health or [place] or need someone to talk to, please call the Suicide and Crisis Lifeline: 988 call the [place] National Suicide Prevention Lifeline: 210-342-0629 or TTY: (850) 712-5335 TTY 610-471-4835) to talk to a trained counselor call 1-800-273-TALK (toll free, 24 hour hotline)   Patient verbalizes understanding of instructions and care plan provided today and agrees to view in [place]. Active MyChart status and patient understanding of how to access instructions and care plan via MyChart confirmed with patient.     [name], [place] [phone]

## 2021-12-24 ENCOUNTER — Other Ambulatory Visit: Payer: Self-pay | Admitting: *Deleted

## 2021-12-24 ENCOUNTER — Telehealth: Payer: Self-pay | Admitting: *Deleted

## 2021-12-24 ENCOUNTER — Ambulatory Visit: Payer: Self-pay

## 2021-12-24 DIAGNOSIS — M7989 Other specified soft tissue disorders: Secondary | ICD-10-CM

## 2021-12-24 NOTE — Patient Outreach (Signed)
  Care Coordination   Follow Up Visit Note   12/24/2021 Name: [name] MRN: [id number] DOB: 11-06-1945  Laura [name] is a 76 y.o. year old female who sees Biagio Borg, MD for primary care. I spoke with  [name] by phone today.  What matters to the patients health and wellness today?  Spoke with patient who expresses concern regarding worsening swelling of leg/ankle and calf. She states primary care referred her to oncologist and she reports she called to schedule an appointment sooner, but did not hear from the doctor. Patient is receptive to [facility] calling to see if a sooner appointment is available.   Goals Addressed             This Visit's Progress    Health Management Community resources       Care Coordination Interventions: Active listening Placed call to oncologist to assist with facilitating appointment-message left requesting to contact patient and/or return call to [facility]. Encouraged patient to [employer] managing anxiety techniques implemented by [employer] Encouraged patient to take medications as prescribed      SDOH assessments and interventions completed:  No  Care Coordination Interventions:  Yes, provided   Follow up plan:  continue to follow    Encounter Outcome:  Pt. Visit Completed   Thea Silversmith, RN, MSN, BSN, [place] Coordinator [phone]

## 2021-12-24 NOTE — Telephone Encounter (Signed)
Patient called to advise that her left leg and ankle continue to swell, associated with pain.  Order placed for Unilateral [place] of left leg and ankle to assess for potential DVT.  Dr. Delton Coombes aware.

## 2021-12-27 ENCOUNTER — Ambulatory Visit (HOSPITAL_COMMUNITY)
Admission: RE | Admit: 2021-12-27 | Discharge: 2021-12-27 | Disposition: A | Discharge status: Home / Self Care | Payer: Medicare Other | Source: Ambulatory Visit | Attending: Hematology | Admitting: Hematology

## 2021-12-27 DIAGNOSIS — M7989 Other specified soft tissue disorders: Secondary | ICD-10-CM | POA: Insufficient documentation

## 2021-12-27 DIAGNOSIS — M79662 Pain in left lower leg: Secondary | ICD-10-CM | POA: Diagnosis not present

## 2021-12-30 NOTE — Progress Notes (Signed)
Called patient to advise of results.  States swelling continues in L ankle, extending to calf, despite elevation of extremity.  Advised that she follow up with her PCP and let him know that we have negative [place] result.  Verbalized understanding.

## 2021-12-31 ENCOUNTER — Ambulatory Visit: Payer: Self-pay | Admitting: Licensed Clinical Social Worker

## 2021-12-31 NOTE — Patient Instructions (Signed)
Visit Information  Thank you for taking time to visit with me today. Please don't hesitate to contact me if I can be of assistance to you.   Following are the goals we discussed today:   Goals Addressed             This Visit's Progress    Care Coordination Activiites Reduce symptoms of anxiety       Care Coordination Interventions: Solution-Focused Strategies employed:  Mindfulness or Relaxation training provided Active listening / Reflection utilized  Behavioral Activation reviewed Problem [place] strategies reviewed Brief CBT Provided EMMI education information on :  Movement & Emotional Health also Reviewed Anxiety education from previous encounter. Continue Relaxed Breathing and attending Support Group        Our next appointment is by telephone on Jan 18th at 10:00  Please call the care guide team at [phone] if you need to cancel or reschedule your appointment.   If you are experiencing a Mental Health or [place] or need someone to talk to, please call the Suicide and Crisis Lifeline: 988 call the [place] National Suicide Prevention Lifeline: (580)092-0705 or TTY: 848 602 3839 TTY 306-853-6356) to talk to a trained counselor call 1-800-273-TALK (toll free, 24 hour hotline) go to [facility] [address] [place] [phone])   Patient verbalizes understanding of instructions and care plan provided today and agrees to view in [place]. Active MyChart status and patient understanding of how to access instructions and care plan via MyChart confirmed with patient.      [name], [place] [phone]

## 2021-12-31 NOTE — Patient Outreach (Signed)
  Care Coordination  Follow Up Visit Note   12/31/2021 Name: Laura [name] MRN: [id number] DOB: 1945-09-19  Laura [name] is a 76 y.o. year old female who sees Biagio Borg, MD for primary care. I spoke with  [name] by phone today.  What matters to the patients health and wellness today?    Patient continues to maintain positive progress with goals. She is implementing techniques discussed with LCSW and is attending support group  .    Goals Addressed             This Visit's Progress    Care Coordination Activiites Reduce symptoms of anxiety       Care Coordination Interventions: Solution-Focused Strategies employed:  Mindfulness or Relaxation training provided Active listening / Reflection utilized  Behavioral Activation reviewed Problem [place] strategies reviewed Brief CBT Provided EMMI education information on :  Movement & Emotional Health also Reviewed Anxiety education from previous encounter. Continue Relaxed Breathing and attending Support Group        SDOH assessments and interventions completed:  No   Care Coordination Interventions:  Yes, provided   Follow up plan: Follow up call scheduled for 30 days    Encounter Outcome:  Pt. Visit Completed   [name], [place] [phone]

## 2022-01-07 ENCOUNTER — Telehealth: Payer: Self-pay | Admitting: Internal Medicine

## 2022-01-07 DIAGNOSIS — M25571 Pain in right ankle and joints of right foot: Secondary | ICD-10-CM

## 2022-01-07 NOTE — Telephone Encounter (Signed)
Laura [name] called wondering if Dr. Jenny Reichmann could refer her somewhere to help her with her issues with her ankle and the swelling that she has been dealing with. Laura [name] would like a call back at [phone].

## 2022-01-08 NOTE — Telephone Encounter (Signed)
Ok referral to dr Doran Durand - ortho for ankle and feet is done

## 2022-01-09 ENCOUNTER — Encounter: Payer: Self-pay | Admitting: Internal Medicine

## 2022-01-09 NOTE — Telephone Encounter (Signed)
Possible referral needed for leg and ankle swelling

## 2022-01-14 NOTE — Telephone Encounter (Signed)
Called pt gave MD response. Pt states they have called her and her appt is 01/24/22.Marland KitchenJohny Chess

## 2022-01-15 ENCOUNTER — Telehealth: Payer: Self-pay | Admitting: *Deleted

## 2022-01-15 NOTE — Progress Notes (Unsigned)
  Care Coordination Note  01/15/2022 Name: LAMIAH MARMOL MRN: [id number] DOB: 1945/12/20  CAELEN HIGINBOTHAM is a 77 y.o. year old female who is a primary care patient of Biagio Borg, MD and is actively engaged with the care management team. I reached out to [name] by phone today to assist with re-scheduling a follow up visit with the Licensed Clinical Social Worker  Follow up plan: Unsuccessful telephone outreach attempt made. A HIPAA compliant phone message was left for the patient providing contact information and requesting a return call.   [place]  Direct Dial: [phone]

## 2022-01-16 NOTE — Progress Notes (Signed)
  Care Coordination Note  01/16/2022 Name: Laura [name] MRN: [id number] DOB: Dec 10, 1945  GULIANA WEYANDT is a 77 y.o. year old female who is a primary care patient of Biagio Borg, MD and is actively engaged with the care management team. I reached out to [name] by phone today to assist with re-scheduling a follow up visit with the Licensed Clinical Social Worker  Follow up plan: Telephone appointment with care management team member scheduled for:02/03/22  [place]  Direct Dial: [phone]

## 2022-01-20 ENCOUNTER — Encounter (HOSPITAL_COMMUNITY): Payer: Self-pay | Admitting: Hematology

## 2022-01-24 DIAGNOSIS — M79672 Pain in left foot: Secondary | ICD-10-CM | POA: Diagnosis not present

## 2022-01-24 DIAGNOSIS — M25572 Pain in left ankle and joints of left foot: Secondary | ICD-10-CM | POA: Diagnosis not present

## 2022-01-24 DIAGNOSIS — R2242 Localized swelling, mass and lump, left lower limb: Secondary | ICD-10-CM | POA: Diagnosis not present

## 2022-01-24 DIAGNOSIS — M7989 Other specified soft tissue disorders: Secondary | ICD-10-CM | POA: Insufficient documentation

## 2022-01-27 ENCOUNTER — Ambulatory Visit: Payer: Self-pay

## 2022-01-27 NOTE — Patient Instructions (Signed)
Visit Information  Thank you for taking time to visit with me today. Please don't hesitate to contact me if I can be of assistance to you.   Following are the goals we discussed today:   Goals Addressed             This Visit's Progress    COMPLETED: Health Management Community resources       Care Coordination Interventions: Active listening Encouraged to see provider as recommended Reinforced with patient to utiilize managing anxiety techniques implemented by LCSW Upcoming scheduled appointments reviewed Encouraged patient to continue to take medications as prescribed, attend provider visits as scheduled and follow up with providers with questions or concerns as needed RNCM encouraged patient to contact RNCM if care coordination needs in the future. Provided RNCM contact number again.      If you are experiencing a Mental Health or [place] or need someone to talk to, please call the Suicide and Crisis Lifeline: [place], RN, MSN, BSN, [place] [phone]

## 2022-01-27 NOTE — Patient Outreach (Signed)
  Care Coordination   Follow Up Visit Note   01/27/2022 Name: Laura [name] MRN: [id number] DOB: 06-14-1945  Laura [name] is a 77 y.o. year old female who sees Biagio Borg, MD for primary care. I spoke with  Laura [name] by phone today.  What matters to the patients health and wellness today?  Laura [name] expresses that her frustration was that she did not feel as if anyone was addressing her problem re: foot/ankle/leg swelling. She expressed that she now feels like the swelling is being addressed. Per Laura [name] she has been to see the orthopedic provider who thinks it is a vascular issue and is going to refer her to a vascular specialist. Laura [name] states she is satisfied with her current providers. And states at this time she is working on keeping her anxiety under control. She continues to work with LCSW, [name] and is scheduled to talk with her on 02/03/22. Laura [name] is agreeable to no scheduled follow up with [facility] and will contact RNCM with care coordination needs in the future.   Goals Addressed             This Visit's Progress    COMPLETED: Health Management Community resources       Care Coordination Interventions: Active listening Encouraged to see provider as recommended Reinforced with patient to utiilize managing anxiety techniques implemented by LCSW Upcoming scheduled appointments reviewed Encouraged patient to continue to take medications as prescribed, attend provider visits as scheduled and follow up with providers with questions or concerns as needed RNCM encouraged patient to contact RNCM if care coordination needs in the future. Provided RNCM contact number again.      SDOH assessments and interventions completed:  Yes  Care Coordination Interventions:  Yes, provided   Follow up plan: No further intervention required.   Encounter Outcome:  Pt. Visit Completed   Thea Silversmith, RN, MSN, BSN, [place] Coordinator [phone]

## 2022-01-29 ENCOUNTER — Other Ambulatory Visit: Payer: Self-pay | Admitting: *Deleted

## 2022-01-29 ENCOUNTER — Ambulatory Visit (HOSPITAL_COMMUNITY)
Admission: RE | Admit: 2022-01-29 | Discharge: 2022-01-29 | Disposition: A | Discharge status: Home / Self Care | Payer: 59 | Source: Ambulatory Visit | Attending: Vascular Surgery | Admitting: Vascular Surgery

## 2022-01-29 ENCOUNTER — Encounter: Payer: Self-pay | Admitting: Vascular Surgery

## 2022-01-29 ENCOUNTER — Ambulatory Visit (INDEPENDENT_AMBULATORY_CARE_PROVIDER_SITE_OTHER): Payer: 59 | Admitting: Vascular Surgery

## 2022-01-29 VITALS — BP 133/71 | HR 88 | Temp 98.3°F | Resp 18 | Ht 63.0 in | Wt 191.0 lb

## 2022-01-29 DIAGNOSIS — M7989 Other specified soft tissue disorders: Secondary | ICD-10-CM

## 2022-01-29 DIAGNOSIS — M79662 Pain in left lower leg: Secondary | ICD-10-CM | POA: Insufficient documentation

## 2022-01-29 DIAGNOSIS — I872 Venous insufficiency (chronic) (peripheral): Secondary | ICD-10-CM | POA: Diagnosis not present

## 2022-01-29 NOTE — Progress Notes (Signed)
ASSESSMENT & PLAN   CHRONIC VENOUS INSUFFICIENCY: This patient has bilateral lower extremity swelling which is more significant on the left side.  Her venous duplex scan does show some superficial venous reflux on the left but I do not think this would explain her swelling on the left leg.  She has no DVT on the left and no deep venous reflux on the left.  I think more likely the swelling is related to her ovarian cancer which it is wrapped around the IVC and could potentially compromise the proximal common iliac vein on the left.  She is scheduled to see oncology later this month.  She has undergone surgery, radiation therapy, and chemotherapy.  I do not think that addressing her superficial venous reflux would have any impact on the swelling in the left leg.  Again I think this is all related to her cancer.  I think it is aggressively continuing to treat her cancer is the most important thing.  I would not consider a more aggressive venous workup at this point.  I will be happy to see her back at any time if her swelling progresses significantly.  REASON FOR CONSULT:    Left lower extremity swelling.  The consult is requested by Dr. Mechele Claude.   HPI:   Laura [name] is a 77 y.o. female who is referred with left lower extremity swelling.  I have reviewed the records from the referring office.  The patient presented with left foot and ankle swelling since September.  Patient had had 2 negative duplex scans which showed no evidence of DVT.  She has had some associated numbness and tingling in the left foot.  She does not remember any specific injury to her leg.  The swelling is worse during the day and gets better after being recumbent at night.  My history, the patient developed worsening swelling in the left lower extremity in September of last year.  She has been wearing knee-high compression stockings but does not think that this has helped her symptoms much.  She does elevate her legs some.   Her swelling is asymptomatic.  Reportedly she has a history of ovarian cancer which was originally diagnosed in 2021.  She had surgery and radiation therapy.  She had a recurrent cancer in 2022 and is been undergoing chemotherapy.  She is due for a follow-up appointment with oncology later this month.    She denies any previous history of DVT.  Past Medical History:  Diagnosis Date   Anemia    Anxiety    Arthritis    back of neck, bones spurs on neck   Cancer (HCC)    ovarian cancer   Cataract both eyes    Cervical disc disease    Diabetes mellitus Type 2    Family history of adverse reaction to anesthesia    sister slow to awaken   Family history of thyroid cancer    GERD (gastroesophageal reflux disease)    Heart murmur    History of COVID-19 06/2020   pain in side took paxlovid x 5 days all symptoms resolved   History of radiation therapy    abdominal lymph nodes 01/21/2021-02/01/2021  Dr Gery Pray   Hydronephrosis 08/24/2020   Right Kidney (stable per CT)   Hyperlipidemia    Hypertension    IBS (irritable bowel syndrome)    Left shoulder frozen with limited rom    Mucoid cyst of joint    right thumb   Neuropathy  03/12/2021   hands/fingers both hands numb and tingle @ times   Port-A-Cath in place 07/21/2019   Reflux    Sleep apnea 03/12/2021   has not used cpap in 3 years   Vertigo 12/2010   none since treated at [place]   Wears glasses for reading    Wears partial dentures upper     Family History  Problem Relation Age of Onset   Hypertension Mother    Diabetes Father    Hypertension Father    Diabetes Sister    Breast cancer Sister        stage 0   Hypertension Sister    Stroke Sister    Diabetes Maternal Aunt    Thyroid cancer Daughter 48   Hypertension Other    Colon cancer Neg Hx    Esophageal cancer Neg Hx    Stomach cancer Neg Hx    Rectal cancer Neg Hx    Endometrial cancer Neg Hx    Ovarian cancer Neg Hx     SOCIAL HISTORY: Social History    Tobacco Use   Smoking status: Never   Smokeless tobacco: Never  Substance Use Topics   Alcohol use: No    Alcohol/week: 0.0 standard drinks of alcohol    Allergies  Allergen Reactions   Ciprofloxacin Swelling    Torn tendon   Hydrocodone Bit-Homatrop Mbr Other (See Comments)    Vertigo *pt strongly prefers to never take* took 3 years to recover from   Sulfa Antibiotics Hives, Itching and Swelling    Tongue swells   Alfuzosin Other (See Comments)    Weakness and fatigue   Codeine Itching   Crestor [Rosuvastatin Calcium] Other (See Comments)    Did something to memory     Doxycycline Other (See Comments)    Severe rectal Gas.   Gabapentin     disoriented   Keflex [Cephalexin] Diarrhea and Nausea And Vomiting   Myrbetriq Andree [place] Er] Swelling    Swelling of legs and feet   Naproxen Other (See Comments)    Stomach cramps/loud gas   Statins     Muscle weakness   Prednisone Anxiety    *pt strongly prefers to never be given prednisone*     Current Outpatient Medications  Medication Sig Dispense Refill   acetaminophen (TYLENOL) 500 MG tablet Take 1 tablet (500 mg total) by mouth every 6 (six) hours as needed. 30 tablet 0   amLODipine (NORVASC) 5 MG tablet TAKE 1 TABLET (5 MG TOTAL) BY MOUTH DAILY. 90 tablet 3   Bismuth Subsalicylate (PEPTO-BISMOL MAX STRENGTH PO) Take by mouth as needed.     Blood Glucose Monitoring Suppl (ONE TOUCH ULTRA 2) w/Device KIT Use as directed 1 each 0   Cholecalciferol (VITAMIN D3) 50 MCG (2000 UT) TABS Take by mouth.     DM-APAP-CPM (CORICIDIN HBP) 10-325-2 MG TABS Take 1 tablet by mouth daily as needed.     feeding supplement (BOOST HIGH PROTEIN) LIQD Take 237 mLs by mouth daily.     glimepiride (AMARYL) 2 MG tablet Take 0.5 tablets (1 mg total) by mouth daily before breakfast. 1/2 of 2 mg tab 90 tablet 3   glucose blood (ONETOUCH ULTRA) test strip USE TO CHECK BLOOD SUGARS TWO TIMES DAILY 100 strip 3   Lancets (ONETOUCH ULTRASOFT) lancets  1 each by Other route as needed for other. Use as instructed 100 each 3   loratadine (CLARITIN) 10 MG tablet Take 10 mg by mouth daily as needed (allergies.).  LORazepam (ATIVAN) 0.5 MG tablet Take 1 tablet (0.5 mg total) by mouth 2 (two) times daily as needed for anxiety. 60 tablet 0   Metamucil Fiber CHEW Chew by mouth.     traMADol (ULTRAM) 50 MG tablet Take 1 tablet by mouth every 6 hours as needed for severe pain 15 tablet 0   vitamin B-12 (CYANOCOBALAMIN) 1000 MCG tablet Take 1,000 mcg by mouth daily.      Famotidine (PEPCID AC PO) Take by mouth as needed. (Patient not taking: Reported on 01/29/2022)     famotidine-calcium carbonate-magnesium hydroxide (PEPCID COMPLETE) 10-800-165 MG chewable tablet  (Patient not taking: Reported on 01/29/2022)     No current facility-administered medications for this visit.    REVIEW OF SYSTEMS:  '[X]'$  denotes positive finding, '[ ]'$  denotes negative finding Cardiac  Comments:  Chest pain or chest pressure:    Shortness of breath upon exertion: x   Short of breath when lying flat: x   Irregular heart rhythm:        Vascular    Pain in calf, thigh, or hip brought on by ambulation:    Pain in feet at night that wakes you up from your sleep:     Blood clot in your veins:    Leg swelling:  x       Pulmonary    Oxygen at home:    Productive cough:     Wheezing:         Neurologic    Sudden weakness in arms or legs:     Sudden numbness in arms or legs:     Sudden onset of difficulty speaking or slurred speech:    Temporary loss of vision in one eye:     Problems with dizziness:         Gastrointestinal    Blood in stool:     Vomited blood:         Genitourinary    Burning when urinating:     Blood in urine:        Psychiatric    Major depression:         Hematologic    Bleeding problems:    Problems with blood clotting too easily:        Skin    Rashes or ulcers:        Constitutional    Fever or chills:    -  PHYSICAL EXAM:    Vitals:   01/29/22 1403  BP: 133/71  Pulse: 88  Resp: 18  Temp: 98.3 F (36.8 C)  TempSrc: Temporal  SpO2: 100%  Weight: 191 lb (86.6 kg)  Height: '5\' 3"'$  (1.6 m)   Body mass index is 33.83 kg/m. GENERAL: The patient is a well-nourished female, in no acute distress. The vital signs are documented above. CARDIAC: There is a regular rate and rhythm.  VASCULAR: I do not detect carotid bruits. She has palpable pedal pulses. She has significant bilateral lower extremity swelling which is more significant on the left side. PULMONARY: There is good air exchange bilaterally without wheezing or rales. ABDOMEN: Soft and non-tender with normal pitched bowel sounds.  MUSCULOSKELETAL: There are no major deformities. NEUROLOGIC: No focal weakness or paresthesias are detected. SKIN: There are no ulcers or rashes noted. PSYCHIATRIC: The patient has a normal affect.  DATA:    CT ABDOMEN PELVIS: I reviewed the CT abdomen pelvis that was done on 11/22/2021.  This shows that her ovarian cancer is in fact wrapped around her  IVC and could also potentially be partially compromising the left common iliac vein proximally although it is difficult to see clearly on these images.  VENOUS DUPLEX: I have independently interpreted her venous duplex scan today.  This was of the left lower extremity only.  There was no evidence of DVT.  There was no deep venous reflux.  There was superficial venous reflux in the left great saphenous vein down to the knee.  In the thigh the vein was dilated up to 5.6 mm.  In the distal thigh the vein was only about 3.4 mm in diameter.  Results of the study are summarized on the diagram below.    Deitra Mayo [facility]

## 2022-01-30 ENCOUNTER — Encounter: Payer: Medicare Other | Admitting: Licensed Clinical Social Worker

## 2022-02-03 ENCOUNTER — Ambulatory Visit: Payer: Self-pay | Admitting: Licensed Clinical Social Worker

## 2022-02-03 NOTE — Patient Outreach (Signed)
  Care Coordination   Follow Up Visit Note   02/03/2022 Name: Laura [name] MRN: [id number] DOB: [birth year]  Laura [name] is a 77 y.o. year old female who sees Biagio Borg, MD for primary care. I spoke with  Laura [name] by phone today.  What matters to the patients health and wellness today?    Patient continues to maintain positive progress with goals. She is making progress with finding answers about her health with is decreasing symptoms of anxiety.     Recommendation: Patient may benefit from, and is in agreement to continue with support group and coping skills discussed.    Goals Addressed             This Visit's Progress    Care Coordination Activiites Reduce symptoms of anxiety       Care Coordination Interventions: Solution-Focused Strategies employed:  Mindfulness or Relaxation training provided Active listening / Reflection utilized  Behavioral Activation reviewed Problem [place] strategies reviewed Brief CBT Continue Relaxed Breathing and attending Support Group Discussed coping skills        SDOH assessments and interventions completed:  No    Care Coordination Interventions:  Yes, provided   Follow up plan: Follow up call scheduled for 02/13/22    Encounter Outcome:  Pt. Visit Completed   [name], [employer] [phone]

## 2022-02-04 ENCOUNTER — Inpatient Hospital Stay: Payer: 59 | Attending: Hematology

## 2022-02-04 ENCOUNTER — Ambulatory Visit (HOSPITAL_COMMUNITY)
Admission: RE | Admit: 2022-02-04 | Discharge: 2022-02-04 | Disposition: A | Discharge status: Home / Self Care | Payer: 59 | Source: Ambulatory Visit | Attending: Hematology | Admitting: Hematology

## 2022-02-04 DIAGNOSIS — I700 Atherosclerosis of aorta: Secondary | ICD-10-CM | POA: Insufficient documentation

## 2022-02-04 DIAGNOSIS — I129 Hypertensive chronic kidney disease with stage 1 through stage 4 chronic kidney disease, or unspecified chronic kidney disease: Secondary | ICD-10-CM | POA: Diagnosis not present

## 2022-02-04 DIAGNOSIS — E1122 Type 2 diabetes mellitus with diabetic chronic kidney disease: Secondary | ICD-10-CM | POA: Insufficient documentation

## 2022-02-04 DIAGNOSIS — D509 Iron deficiency anemia, unspecified: Secondary | ICD-10-CM | POA: Insufficient documentation

## 2022-02-04 DIAGNOSIS — N189 Chronic kidney disease, unspecified: Secondary | ICD-10-CM | POA: Diagnosis not present

## 2022-02-04 DIAGNOSIS — N2889 Other specified disorders of kidney and ureter: Secondary | ICD-10-CM | POA: Diagnosis not present

## 2022-02-04 DIAGNOSIS — C563 Malignant neoplasm of bilateral ovaries: Secondary | ICD-10-CM | POA: Diagnosis not present

## 2022-02-04 DIAGNOSIS — K449 Diaphragmatic hernia without obstruction or gangrene: Secondary | ICD-10-CM | POA: Diagnosis not present

## 2022-02-04 DIAGNOSIS — D631 Anemia in chronic kidney disease: Secondary | ICD-10-CM | POA: Insufficient documentation

## 2022-02-04 DIAGNOSIS — R9389 Abnormal findings on diagnostic imaging of other specified body structures: Secondary | ICD-10-CM | POA: Diagnosis not present

## 2022-02-04 DIAGNOSIS — C569 Malignant neoplasm of unspecified ovary: Secondary | ICD-10-CM | POA: Insufficient documentation

## 2022-02-04 LAB — CBC WITH DIFFERENTIAL/PLATELET
Abs Immature Granulocytes: 0.03 10*3/uL (ref 0.00–0.07)
Basophils Absolute: 0 10*3/uL (ref 0.0–0.1)
Basophils Relative: 0 %
Eosinophils Absolute: 0.1 10*3/uL (ref 0.0–0.5)
Eosinophils Relative: 1 %
HCT: 35.6 % — ABNORMAL LOW (ref 36.0–46.0)
Hemoglobin: 11.1 g/dL — ABNORMAL LOW (ref 12.0–15.0)
Immature Granulocytes: 0 %
Lymphocytes Relative: 22 %
Lymphs Abs: 1.8 10*3/uL (ref 0.7–4.0)
MCH: 27.6 pg (ref 26.0–34.0)
MCHC: 31.2 g/dL (ref 30.0–36.0)
MCV: 88.6 fL (ref 80.0–100.0)
Monocytes Absolute: 0.6 10*3/uL (ref 0.1–1.0)
Monocytes Relative: 8 %
Neutro Abs: 5.4 10*3/uL (ref 1.7–7.7)
Neutrophils Relative %: 69 %
Platelets: 227 10*3/uL (ref 150–400)
RBC: 4.02 MIL/uL (ref 3.87–5.11)
RDW: 15.1 % (ref 11.5–15.5)
WBC: 7.9 10*3/uL (ref 4.0–10.5)
nRBC: 0 % (ref 0.0–0.2)

## 2022-02-04 LAB — COMPREHENSIVE METABOLIC PANEL
ALT: 11 U/L (ref 0–44)
AST: 20 U/L (ref 15–41)
Albumin: 4.1 g/dL (ref 3.5–5.0)
Alkaline Phosphatase: 71 U/L (ref 38–126)
Anion gap: 9 (ref 5–15)
BUN: 28 mg/dL — ABNORMAL HIGH (ref 8–23)
CO2: 22 mmol/L (ref 22–32)
Calcium: 9.8 mg/dL (ref 8.9–10.3)
Chloride: 105 mmol/L (ref 98–111)
Creatinine, Ser: 1.49 mg/dL — ABNORMAL HIGH (ref 0.44–1.00)
GFR, Estimated: 36 mL/min — ABNORMAL LOW (ref >60)
Glucose, Bld: 92 mg/dL (ref 70–99)
Potassium: 4.4 mmol/L (ref 3.5–5.1)
Sodium: 136 mmol/L (ref 135–145)
Total Bilirubin: 0.4 mg/dL (ref 0.3–1.2)
Total Protein: 8 g/dL (ref 6.5–8.1)

## 2022-02-04 LAB — MAGNESIUM: Magnesium: 2.4 mg/dL (ref 1.7–2.4)

## 2022-02-04 MED ORDER — IOHEXOL 300 MG/ML  SOLN
80.0000 mL | Freq: Once | INTRAMUSCULAR | Status: AC | PRN
  Administered 2022-02-04: 80 mL via INTRAVENOUS

## 2022-02-06 ENCOUNTER — Telehealth: Payer: Self-pay | Admitting: Internal Medicine

## 2022-02-06 ENCOUNTER — Other Ambulatory Visit (HOSPITAL_BASED_OUTPATIENT_CLINIC_OR_DEPARTMENT_OTHER): Payer: Self-pay

## 2022-02-06 ENCOUNTER — Other Ambulatory Visit: Payer: Self-pay | Admitting: Internal Medicine

## 2022-02-06 LAB — CA 125: Cancer Antigen (CA) 125: 8.6 U/mL (ref 0.0–38.1)

## 2022-02-06 NOTE — Telephone Encounter (Signed)
Patient called wanting to know if Dr. Jenny Reichmann could put a referral in for her to go to [facility] due to the lab work she just had done recently. Best callback number is [phone].

## 2022-02-06 NOTE — Telephone Encounter (Signed)
Please advise as previous labs were ordered by another provider and does not indicate that an UA was obtained or ran on

## 2022-02-06 NOTE — Telephone Encounter (Signed)
There is no recent urine testing, so I am unclear as to why she needs this.  Please clarify the reason, or pt can have OV to discuss.

## 2022-02-07 ENCOUNTER — Other Ambulatory Visit (HOSPITAL_BASED_OUTPATIENT_CLINIC_OR_DEPARTMENT_OTHER): Payer: Self-pay

## 2022-02-07 ENCOUNTER — Encounter (HOSPITAL_BASED_OUTPATIENT_CLINIC_OR_DEPARTMENT_OTHER): Payer: Self-pay | Admitting: Pharmacist

## 2022-02-07 NOTE — Telephone Encounter (Signed)
Called pt she states she actually saw her oncologist and it was stated that her BUN & creatinine was elevated. She have an appt w/ MD next week will discuss swollen ankles and if he thinks she need urology.Laura [name]

## 2022-02-07 NOTE — Telephone Encounter (Signed)
Called pt no answer RTC.Marland KitchenJohny [name]

## 2022-02-09 ENCOUNTER — Other Ambulatory Visit (HOSPITAL_BASED_OUTPATIENT_CLINIC_OR_DEPARTMENT_OTHER): Payer: Self-pay

## 2022-02-10 ENCOUNTER — Other Ambulatory Visit (HOSPITAL_BASED_OUTPATIENT_CLINIC_OR_DEPARTMENT_OTHER): Payer: Self-pay

## 2022-02-12 ENCOUNTER — Inpatient Hospital Stay (HOSPITAL_BASED_OUTPATIENT_CLINIC_OR_DEPARTMENT_OTHER): Payer: 59 | Admitting: Hematology

## 2022-02-12 VITALS — BP 131/73 | HR 80 | Temp 98.4°F | Resp 18 | Ht 63.0 in | Wt 191.4 lb

## 2022-02-12 DIAGNOSIS — D509 Iron deficiency anemia, unspecified: Secondary | ICD-10-CM | POA: Diagnosis not present

## 2022-02-12 DIAGNOSIS — E1122 Type 2 diabetes mellitus with diabetic chronic kidney disease: Secondary | ICD-10-CM | POA: Diagnosis not present

## 2022-02-12 DIAGNOSIS — I129 Hypertensive chronic kidney disease with stage 1 through stage 4 chronic kidney disease, or unspecified chronic kidney disease: Secondary | ICD-10-CM | POA: Diagnosis not present

## 2022-02-12 DIAGNOSIS — C569 Malignant neoplasm of unspecified ovary: Secondary | ICD-10-CM

## 2022-02-12 DIAGNOSIS — N189 Chronic kidney disease, unspecified: Secondary | ICD-10-CM | POA: Diagnosis not present

## 2022-02-12 DIAGNOSIS — D631 Anemia in chronic kidney disease: Secondary | ICD-10-CM | POA: Diagnosis not present

## 2022-02-12 DIAGNOSIS — C563 Malignant neoplasm of bilateral ovaries: Secondary | ICD-10-CM | POA: Diagnosis not present

## 2022-02-12 NOTE — Progress Notes (Signed)
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 measuring 2.4 x 4.8 cm, SUV 2.5, previous SUV 19.5. -Robotic assisted laparoscopic total hysterectomy and bilateral salpingo-oophorectomy by Dr. Denman George on 01/03/2020. -Pathology showed soft tissue deformities 2 sites and psammomatous calcifications and chronic inflammation with no malignancy identified in the para-aortic lymph node.  Right salpingo-oophorectomy showed microscopic focus of residual adenocarcinoma less than 1 mm.  No malignancy in the left ovary.  [id number]. -As she had prior difficulty with chemotherapy, no further chemotherapy after surgery was recommended. - XRT to retroperitoneal nodal mass (IMRT) from 01/21/2021 through 02/01/2021.  10 fractions, 40 Gray. - Right ureteral stent placement on 03/13/2021 due to  right hydronephrosis from retroperitoneal nodal mass. - CTAP (  04/01/2021): Centrally necrotic lymph node has increased in size. - We discussed CT findings and reviewed images.  Last tumor marker was normal. - She complained of right lower quadrant pain radiating to the back which started on 05/11/2021 and ended on 05/13/2021 night. - 6 cycles of dose reduced carboplatin and paclitaxel from 05/22/2021 through 09/04/2021.   PLAN:  1.  Recurrent ovarian serous carcinoma: - She completed 6 cycles of chemotherapy last dose on 09/04/2021. - Reviewed CT AP (02/04/2022): Stable size of the retroperitoneal mass involving the right ureteral and diverting third portion of the duodenum.  Stable left parotid lymph node.  Slight increasing nodularity along the lower pelvis on the left side at the level of the bladder near the course of the distal ureter on the left.  Slight worsening of collecting system dilatation on the right side. - Ca1 25 is 8.6 and stable.  Creatinine slightly worse at 1.49. - I have recommended that she follow-up with urology to see if the stent is functioning well or needs to be changed.  She will do that after talking to Dr. Jenny Reichmann tomorrow. - I have recommended follow-up in 3 months with repeat CT scan and tumor marker.    2.  Hypertension: - Continue amlodipine daily.  Blood pressure is well-controlled at 130/70.  3.  Normocytic anemia: - Normocytic anemia with CKD and relative iron deficiency. - Hemoglobin has improved to 11.1.  Last ferritin was 48 on 11/12/2021.   Orders placed this encounter:  No orders of the defined types were placed in this encounter.    I,Alexis Herring,acting as a Education administrator for [employer], MD.,have documented all relevant documentation on the behalf of Derek Jack, MD,as directed by  Derek Jack, MD while in the presence of Derek Jack, MD.  I, Derek Jack MD, have reviewed the above documentation for accuracy and  completeness, and I agree with the above.   Derek Jack, MD [place] [phone]

## 2022-02-12 NOTE — Patient Instructions (Signed)
[facility]  Discharge Instructions  You were seen and examined today by Dr. Delton Coombes.  Dr. Delton Coombes discussed your most recent lab work and CT scan which revealed that everything looks stable.   Follow-up as scheduled in 3 months with labs and repeat scan.    Thank you for choosing [facility] to provide your oncology and hematology care.   To afford each patient quality time with our provider, please arrive at least 15 minutes before your scheduled appointment time. You may need to reschedule your appointment if you arrive late (10 or more minutes). Arriving late affects you and other patients whose appointments are after yours.  Also, if you miss three or more appointments without notifying the office, you may be dismissed from the clinic at the provider's discretion.    Again, thank you for choosing [facility].  Our hope is that these requests will decrease the amount of time that you wait before being seen by our physicians.   If you have a lab appointment with the [facility] please come in thru the Main Entrance and check in at the main information desk.           _____________________________________________________________  Should you have questions after your visit to [facility], please contact our office at [phone] and follow the prompts.  Our office hours are 8:00 a.m. to 4:30 p.m. Monday - Thursday and 8:00 a.m. to 2:30 p.m. Friday.  Please note that voicemails left after 4:00 p.m. may not be returned until the following business day.  We are closed weekends and all major holidays.  You do have access to a nurse 24-7, just call the main number to the clinic [phone] and do not press any options, hold on the line and a nurse will answer the phone.    For prescription refill requests, have your pharmacy contact our office and allow 72 hours.    Masks are optional in the cancer  centers. If you would like for your care team to wear a mask while they are taking care of you, please let them know. You may have one support person who is at least 78 years old accompany you for your appointments.

## 2022-02-13 ENCOUNTER — Ambulatory Visit: Payer: Self-pay | Admitting: Licensed Clinical Social Worker

## 2022-02-13 ENCOUNTER — Encounter (HOSPITAL_COMMUNITY): Payer: 59

## 2022-02-13 NOTE — Patient Outreach (Signed)
  Care Coordination  Follow Up Visit Note   02/13/2022 Name: SKYLIE HIOTT MRN: [id number] DOB: 02/14/1945  SHIRLE PROVENCAL is a 77 y.o. year old female who sees Biagio Borg, MD for primary care. I spoke with  [name] by phone today.  What matters to the patients health and wellness today?  Reports doing well.    Goals Addressed             This Visit's Progress    Care Coordination Activiites Reduce symptoms of anxiety       Care Coordination Interventions: Active listening / Reflection utilized  Brief CBT Continue Relaxed Breathing and attending Support Group         SDOH assessments and interventions completed:  No    Care Coordination Interventions:  Yes, provided   Follow up plan:  Follow up not scheduled during this encounter, patient would like to be contact in about a week or two.    Encounter Outcome:  Pt. Visit Completed   [name], [place] [phone]

## 2022-02-13 NOTE — Patient Instructions (Signed)
Visit Information  Thank you for taking time to visit with me today. Please don't hesitate to contact me if I can be of assistance to you.   Following are the goals we discussed today:   Goals Addressed             This Visit's Progress    Care Coordination Activiites Reduce symptoms of anxiety       Care Coordination Interventions: Active listening / Reflection utilized  Brief CBT Continue Relaxed Breathing and attending Support Group          Please call the care guide team at [phone] if you need to cancel or reschedule your appointment.   If you are experiencing a Mental Health or [place] or need someone to talk to, please call the Suicide and Crisis Lifeline: 988 call the [place] National Suicide Prevention Lifeline: 717-810-5059 or TTY: 816 407 0293 TTY 407-884-5780) to talk to a trained counselor   Patient verbalizes understanding of instructions and care plan provided today and agrees to view in [place]. Active MyChart status and patient understanding of how to access instructions and care plan via MyChart confirmed with patient.     The Care guide will contact you to schedule our next appointment   [name], [place] [phone]

## 2022-02-14 ENCOUNTER — Other Ambulatory Visit (HOSPITAL_BASED_OUTPATIENT_CLINIC_OR_DEPARTMENT_OTHER): Payer: Self-pay

## 2022-02-14 ENCOUNTER — Ambulatory Visit (INDEPENDENT_AMBULATORY_CARE_PROVIDER_SITE_OTHER): Payer: 59 | Admitting: Internal Medicine

## 2022-02-14 ENCOUNTER — Encounter (HOSPITAL_BASED_OUTPATIENT_CLINIC_OR_DEPARTMENT_OTHER): Payer: Self-pay | Admitting: Pharmacist

## 2022-02-14 VITALS — BP 108/60 | HR 86 | Temp 98.0°F | Ht 63.0 in | Wt 190.0 lb

## 2022-02-14 DIAGNOSIS — Z0001 Encounter for general adult medical examination with abnormal findings: Secondary | ICD-10-CM

## 2022-02-14 DIAGNOSIS — M79662 Pain in left lower leg: Secondary | ICD-10-CM

## 2022-02-14 DIAGNOSIS — E119 Type 2 diabetes mellitus without complications: Secondary | ICD-10-CM

## 2022-02-14 DIAGNOSIS — I10 Essential (primary) hypertension: Secondary | ICD-10-CM | POA: Diagnosis not present

## 2022-02-14 DIAGNOSIS — M7989 Other specified soft tissue disorders: Secondary | ICD-10-CM | POA: Diagnosis not present

## 2022-02-14 DIAGNOSIS — N1832 Chronic kidney disease, stage 3b: Secondary | ICD-10-CM | POA: Diagnosis not present

## 2022-02-14 DIAGNOSIS — E559 Vitamin D deficiency, unspecified: Secondary | ICD-10-CM | POA: Diagnosis not present

## 2022-02-14 DIAGNOSIS — C772 Secondary and unspecified malignant neoplasm of intra-abdominal lymph nodes: Secondary | ICD-10-CM | POA: Diagnosis not present

## 2022-02-14 DIAGNOSIS — N3281 Overactive bladder: Secondary | ICD-10-CM

## 2022-02-14 LAB — POCT GLYCOSYLATED HEMOGLOBIN (HGB A1C): Hemoglobin A1C: 5.7 % — AB (ref 4.0–5.6)

## 2022-02-14 MED ORDER — SOLIFENACIN SUCCINATE 5 MG PO TABS
5.0000 mg | ORAL_TABLET | Freq: Every day | ORAL | 3 refills | Status: DC
  Filled 2022-02-14: qty 90, 90d supply, fill #0
  Filled 2022-09-09: qty 30, 30d supply, fill #1
  Filled 2022-09-09: qty 60, 60d supply, fill #1
  Filled 2022-10-06: qty 90, 90d supply, fill #1

## 2022-02-14 NOTE — Patient Instructions (Addendum)
Ok to restart the vesicare 5 mg   Your A1c was done today  Please continue all other medications as before, and refills have been done if requested.  Please have the pharmacy call with any other refills you may need.  Please continue your efforts at being more active, low cholesterol diet, and weight control.  You are otherwise up to date with prevention measures today.  Please keep your appointments with your specialists as you may have planned - Urology in 2 wks for the stent removal, and the follow up CT scan in April 2024  You will be contacted regarding the referral for: Kidney doctor (nephrology)  Please continue to wear the compression stocking for the left leg  No other lab work needed today  Please make an Appointment to return in 4 months, or sooner if needed

## 2022-02-14 NOTE — Progress Notes (Unsigned)
Patient ID: Laura [name], female   DOB: [birth year], 77 y.o.   MRN: [id number]         Chief Complaint:: wellness exam and Abnormal UA (Referral for another urologist outside of [place])  , htn, ckd, OAB, abnormal CT with left pelvis nodularity, DM       HPI:  Laura [name] is a 77 y.o. female here for wellness exam; pt to call for eye exam, declines covid booster, for shingrix at pharmacy, o/w up to date                        Also seeing urology in [place], but wondering if needs to see different urology as she is convinced her right ureteral issue is somehow causing her left leg swelling.  Did have mild worsening renal fxn despite good hydration.  Recent CT also with "left pelvis nodularity" but no dvt or other vascular issue.   1/17 LLE venous doppler neg for DVT.  Pt denies chest pain, increased sob or doe, wheezing, orthopnea, PND, increased LE swelling, palpitations, dizziness or syncope.   Pt denies polydipsia, polyuria, or new focal neuro s/s.    Pt denies fever, wt loss, night sweats, loss of appetite, or other constitutional symptoms  Does have worsening urinary frequency in the past month after out of Vesicare, needs restart.  BP has been < 140/90 at home and other recent doctor visits.     Wt Readings from Last 3 Encounters:  02/14/22 190 lb (86.2 kg)  02/12/22 191 lb 6.4 oz (86.8 kg)  01/29/22 191 lb (86.6 kg)   BP Readings from Last 3 Encounters:  02/14/22 108/60  02/12/22 131/73  01/29/22 133/71   Immunization History  Administered Date(s) Administered   Fluad Quad(high Dose 65+) 11/22/2018, 10/11/2019, 11/30/2020, 11/21/2021   Influenza Split 01/06/2011, 10/22/2011   Influenza, High Dose Seasonal PF 12/07/2012, 11/17/2013, 11/17/2013, 12/12/2015, 12/01/2016, 11/19/2017   Pneumococcal Conjugate-13 02/15/2013   Pneumococcal Polysaccharide-23 01/19/2006, 01/17/2008, 02/23/2017   Td 01/15/2004   Tdap 06/29/2015   Health Maintenance Due  Topic Date Due   COVID-19 Vaccine (1)  Never done   Zoster Vaccines- Shingrix (1 of 2) Never done   OPHTHALMOLOGY EXAM  10/23/2020   Diabetic kidney evaluation - Urine ACR  03/06/2022      Past Medical History:  Diagnosis Date   Anemia    Anxiety    Arthritis    back of neck, bones spurs on neck   Cancer (HCC)    ovarian cancer   Cataract both eyes    Cervical disc disease    Diabetes mellitus Type 2    Family history of adverse reaction to anesthesia    sister slow to awaken   Family history of thyroid cancer    GERD (gastroesophageal reflux disease)    Heart murmur    History of COVID-19 06/2020   pain in side took paxlovid x 5 days all symptoms resolved   History of radiation therapy    abdominal lymph nodes 01/21/2021-02/01/2021  Dr Gery Pray   Hydronephrosis 08/24/2020   Right Kidney (stable per CT)   Hyperlipidemia    Hypertension    IBS (irritable bowel syndrome)    Left shoulder frozen with limited rom    Mucoid cyst of joint    right thumb   Neuropathy 03/12/2021   hands/fingers both hands numb and tingle @ times   Port-A-Cath in place 07/21/2019   Reflux    Sleep  apnea 03/12/2021   has not used cpap in 3 years   Vertigo 12/2010   none since treated at [facility]   Wears glasses for reading    Wears partial dentures upper    Past Surgical History:  Procedure Laterality Date   BLADDER SURGERY     [facility]   BREAST EXCISIONAL BIOPSY Bilateral    BREAST SURGERY     fibroid cyst removed ? over 10 yrs ago at [facility] per pt on 03-12-2021   CHOLECYSTECTOMY     yrs ago   COLONOSCOPY  07/02/2020   and 09-19-2016   CYSTOSCOPY W/ URETERAL STENT PLACEMENT Right 03/13/2021   Procedure: CYSTOSCOPY WITH RETROGRADE PYELOGRAM/URETERAL STENT PLACEMENT;  Surgeon: Robley Fries, MD;  Location: [facility];  Service: Urology;  Laterality: Right;  30 MINS   CYSTOSCOPY W/ URETERAL STENT PLACEMENT Right 10/01/2021   Procedure: CYSTOSCOPY WITH RETROGRADE PYELOGRAM/URETERAL STENT REPLACEMENT;  Surgeon:  Robley Fries, MD;  Location: [facility];  Service: Urology;  Laterality: Right;   fibroids removed     breast (both breasts) age 57 and total of 3 surgeries   history of chemotherapy     6 rounds june 2021   IR IMAGING GUIDED PORT INSERTION  07/26/2019   right   MASS EXCISION Right 06/26/2016   Procedure: EXCISION MUCOID TUMOR RIGHT THUMB, IP RIGHT THUMB;  Surgeon: Daryll Brod, MD;  Location: [facility];  Service: Orthopedics;  Laterality: Right;   ROBOTIC ASSISTED BILATERAL SALPINGO OOPHERECTOMY N/A 01/03/2020   Procedure: XI ROBOTIC ASSISTED BILATERAL SALPINGO OOPHORECTOMY, RADICAL TUMOR DEBULKING;  Surgeon: Everitt Amber, MD;  Location: [facility];  Service: Gynecology;  Laterality: N/A;   ROBOTIC PELVIC AND PARA-AORTIC LYMPH NODE DISSECTION N/A 01/03/2020   Procedure: XI ROBOTIC PARA-AORTIC LYMPHADENECTOMY;  Surgeon: Everitt Amber, MD;  Location: [facility];  Service: Gynecology;  Laterality: N/A;   VAGINAL HYSTERECTOMY     age late 34's    reports that she has never smoked. She has never used smokeless tobacco. She reports that she does not drink alcohol and does not use drugs. family history includes Breast cancer in her sister; Diabetes in her father, maternal aunt, and sister; Hypertension in her father, mother, sister, and another family member; Stroke in her sister; Thyroid cancer (age of onset: 69) in her daughter. Allergies  Allergen Reactions   Ciprofloxacin Swelling    Torn tendon   Hydrocodone Bit-Homatrop Mbr Other (See Comments)    Vertigo *pt strongly prefers to never take* took 3 years to recover from   Sulfa Antibiotics Hives, Itching and Swelling    Tongue swells   Alfuzosin Other (See Comments)    Weakness and fatigue   Codeine Itching   Crestor [Rosuvastatin Calcium] Other (See Comments)    Did something to memory     Doxycycline Other (See Comments)    Severe rectal Gas.   Gabapentin     disoriented   Keflex [Cephalexin] Diarrhea and  Nausea And Vomiting   Myrbetriq [facility] [place] [facility]] Swelling    Swelling of legs and feet   Naproxen Other (See Comments)    Stomach cramps/loud gas   Statins     Muscle weakness   Prednisone Anxiety    *pt strongly prefers to never be given prednisone*    Current Outpatient Medications on File Prior to Visit  Medication Sig Dispense Refill   acetaminophen (TYLENOL) 500 MG tablet Take 1 tablet (500 mg total) by mouth every  6 (six) hours as needed. 30 tablet 0   amLODipine (NORVASC) 5 MG tablet TAKE 1 TABLET (5 MG TOTAL) BY MOUTH DAILY. 90 tablet 3   Bismuth Subsalicylate (PEPTO-BISMOL MAX STRENGTH PO) Take by mouth as needed.     Blood Glucose Monitoring Suppl (ONE TOUCH ULTRA 2) w/Device KIT Use as directed 1 each 0   Cholecalciferol (VITAMIN D3) 50 MCG (2000 UT) TABS Take by mouth.     DM-APAP-CPM (CORICIDIN HBP) 10-325-2 MG TABS Take 1 tablet by mouth daily as needed.     Famotidine (PEPCID AC PO) Take by mouth as needed.     famotidine-calcium carbonate-magnesium hydroxide (PEPCID COMPLETE) 10-800-165 MG chewable tablet      feeding supplement (BOOST HIGH PROTEIN) LIQD Take 237 mLs by mouth daily.     glimepiride (AMARYL) 2 MG tablet Take 0.5 tablets (1 mg total) by mouth daily before breakfast. 1/2 of 2 mg tab 90 tablet 3   glucose blood (ONETOUCH ULTRA) test strip USE TO CHECK BLOOD SUGARS TWO TIMES DAILY 100 strip 3   Lancets (ONETOUCH ULTRASOFT) lancets 1 each by Other route as needed for other. Use as instructed 100 each 3   loratadine (CLARITIN) 10 MG tablet Take 10 mg by mouth daily as needed (allergies.).     LORazepam (ATIVAN) 0.5 MG tablet Take 1 tablet (0.5 mg total) by mouth 2 (two) times daily as needed for anxiety. 60 tablet 0   Metamucil Fiber CHEW Chew by mouth.     traMADol (ULTRAM) 50 MG tablet Take 1 tablet by mouth every 6 hours as needed for severe pain 15 tablet 0   vitamin B-12 (CYANOCOBALAMIN) 1000 MCG tablet Take 1,000 mcg by mouth daily.      No current  facility-administered medications on file prior to visit.        ROS:  All others reviewed and negative.  Objective        PE:  BP 108/60 (BP Location: Left Arm, Patient Position: Sitting, Cuff Size: Large)   Pulse 86   Temp 98 F (36.7 C) (Oral)   Ht '5\' 3"'$  (1.6 m)   Wt 190 lb (86.2 kg)   SpO2 97%   BMI 33.66 kg/m                 Constitutional: Pt appears in NAD               HENT: Head: NCAT.                Right Ear: External ear normal.                 Left Ear: External ear normal.                Eyes: . Pupils are equal, round, and reactive to light. Conjunctivae and EOM are normal               Nose: without d/c or deformity               Neck: Neck supple. Gross normal ROM               Cardiovascular: Normal rate and regular rhythm.                 Pulmonary/Chest: Effort normal and breath sounds without rales or wheezing.                Abd:  Soft, NT, ND, + BS, no organomegaly  Neurological: Pt is alert. At baseline orientation, motor grossly intact               Skin: Skin is warm. No rashes, no other new lesions, LE edema - none               Psychiatric: Pt behavior is normal without agitation   Micro: none  Cardiac tracings I have personally interpreted today:  none  Pertinent Radiological findings (summarize): none   Lab Results  Component Value Date   WBC 7.9 02/04/2022   HGB 11.1 (L) 02/04/2022   HCT 35.6 (L) 02/04/2022   PLT 227 02/04/2022   GLUCOSE 92 02/04/2022   CHOL 237 (H) 03/06/2021   TRIG 169.0 (H) 03/06/2021   HDL 49.20 03/06/2021   LDLDIRECT 172.0 02/28/2020   LDLCALC 154 (H) 03/06/2021   ALT 11 02/04/2022   AST 20 02/04/2022   NA 136 02/04/2022   K 4.4 02/04/2022   CL 105 02/04/2022   CREATININE 1.49 (H) 02/04/2022   BUN 28 (H) 02/04/2022   CO2 22 02/04/2022   TSH 1.38 03/06/2021   INR 1.0 06/06/2019   HGBA1C 5.7 (A) 02/14/2022   MICROALBUR 2.1 (H) 03/06/2021   POCT -  Hemoglobin A1C 4.0 - 5.6 % 5.7 Abnormal  6.2  Abnormal    Assessment/Plan:  Laura [name] is a 77 y.o. Black or African American [2] female with  has a past medical history of Anemia, Anxiety, Arthritis, Cancer ([place]), Cataract both eyes, Cervical disc disease, Diabetes mellitus Type 2, Family history of adverse reaction to anesthesia, Family history of thyroid cancer, GERD (gastroesophageal reflux disease), Heart murmur, History of COVID-19 (06/2020), History of radiation therapy, Hydronephrosis (08/24/2020), Hyperlipidemia, Hypertension, IBS (irritable bowel syndrome), Left shoulder frozen with limited rom, Mucoid cyst of joint, Neuropathy (03/12/2021), Port-A-Cath in place (07/21/2019), Reflux, Sleep apnea (03/12/2021), Vertigo (12/2010), Wears glasses for reading, and Wears partial dentures upper.  Encounter for well adult exam with abnormal findings Age and sex appropriate education and counseling updated with regular exercise and diet Referrals for preventative services - pt to call for eye exam Immunizations addressed - declines covid booster, for shingrix at the pharmacy Smoking counseling  - none needed Evidence for depression or other mood disorder - none significant Most recent labs reviewed. I have personally reviewed and have noted: 1) the patient's medical and social history 2) The patient's current medications and supplements 3) The patient's height, weight, and BMI have been recorded in the chart   Diabetes mellitus type 2, noninsulin dependent ([place]) Lab Results  Component Value Date   HGBA1C 5.7 (A) 02/14/2022   Stable, pt to continue current medical treatment amaryl 1 mg qam   Stage 3b chronic kidney disease (CKD) ([place]) Lab Results  Component Value Date   CREATININE 1.49 (H) 02/04/2022   Stable overall, cont to avoid nephrotoxins, and refer nephrology   Vitamin D deficiency Last vitamin D Lab Results  Component Value Date   VD25OH 49.13 03/06/2021   Stable, cont oral replacement   OAB (overactive  bladder) With recent mild worsening, declines ua today, for restart vesicare 5 mg qd  Hypertension BP Readings from Last 3 Encounters:  02/14/22 108/60  02/12/22 131/73  01/29/22 133/71   Stable, pt to continue medical treatment norvasc 5 qd   Pain and swelling of left lower leg I suspect c/w lymphedema possibly related to nodularity suggested at last CT to left pelvis; pt for compression stocking  Secondary malignant neoplasm of intra-abdominal lymph  nodes [facility]) With right ureteral complication - d/w pt, I think should be ok for f/u with Dr Claudia Desanctis at [facility] urology and not required for new referral outside [place]  Followup: Return in about 4 months (around 06/15/2022).  Cathlean Cower, MD 02/15/2022 8:06 PM [facility]

## 2022-02-15 ENCOUNTER — Encounter: Payer: Self-pay | Admitting: Internal Medicine

## 2022-02-15 NOTE — Assessment & Plan Note (Signed)
Age and sex appropriate education and counseling updated with regular exercise and diet Referrals for preventative services - pt to call for eye exam Immunizations addressed - declines covid booster, for shingrix at the pharmacy Smoking counseling  - none needed Evidence for depression or other mood disorder - none significant Most recent labs reviewed. I have personally reviewed and have noted: 1) the patient's medical and social history 2) The patient's current medications and supplements 3) The patient's height, weight, and BMI have been recorded in the chart

## 2022-02-15 NOTE — Assessment & Plan Note (Signed)
Lab Results  Component Value Date   CREATININE 1.49 (H) 02/04/2022   Stable overall, cont to avoid nephrotoxins, and refer nephrology

## 2022-02-15 NOTE — Assessment & Plan Note (Signed)
BP Readings from Last 3 Encounters:  02/14/22 108/60  02/12/22 131/73  01/29/22 133/71   Stable, pt to continue medical treatment norvasc 5 qd

## 2022-02-15 NOTE — Assessment & Plan Note (Signed)
With recent mild worsening, declines ua today, for restart vesicare 5 mg qd

## 2022-02-15 NOTE — Assessment & Plan Note (Signed)
With right ureteral complication - d/w pt, I think should be ok for f/u with Dr Claudia Desanctis at [facility] urology and not required for new referral outside [facility]

## 2022-02-15 NOTE — Assessment & Plan Note (Signed)
Last vitamin D Lab Results  Component Value Date   VD25OH 49.13 03/06/2021   Stable, cont oral replacement

## 2022-02-15 NOTE — Assessment & Plan Note (Signed)
I suspect c/w lymphedema possibly related to nodularity suggested at last CT to left pelvis; pt for compression stocking

## 2022-02-15 NOTE — Assessment & Plan Note (Signed)
Lab Results  Component Value Date   HGBA1C 5.7 (A) 02/14/2022   Stable, pt to continue current medical treatment amaryl 1 mg qam

## 2022-02-18 DIAGNOSIS — N130 Hydronephrosis with ureteropelvic junction obstruction: Secondary | ICD-10-CM | POA: Diagnosis not present

## 2022-02-18 DIAGNOSIS — N390 Urinary tract infection, site not specified: Secondary | ICD-10-CM | POA: Diagnosis not present

## 2022-02-20 ENCOUNTER — Telehealth: Payer: Self-pay | Admitting: *Deleted

## 2022-02-20 ENCOUNTER — Other Ambulatory Visit (HOSPITAL_BASED_OUTPATIENT_CLINIC_OR_DEPARTMENT_OTHER): Payer: Self-pay

## 2022-02-20 MED ORDER — CEFDINIR 300 MG PO CAPS
300.0000 mg | ORAL_CAPSULE | Freq: Two times a day (BID) | ORAL | 0 refills | Status: AC
  Filled 2022-02-20 – 2022-02-22 (×2): qty 14, 7d supply, fill #0

## 2022-02-20 NOTE — Progress Notes (Signed)
  Care Coordination Note  02/20/2022 Name: DOREATHER HOXWORTH MRN: [id number] DOB: Jun 24, 1945  MARGORIE RENNER is a 77 y.o. year old female who is a primary care patient of Biagio Borg, MD and is actively engaged with the care management team. I reached out to [name] by phone today to assist with scheduling a follow up visit with the Licensed Clinical Social Worker  Follow up plan: Unsuccessful telephone outreach attempt made. A HIPAA compliant phone message was left for the patient providing contact information and requesting a return call.   Julian Hy, [place] Direct Dial: [phone]

## 2022-02-22 ENCOUNTER — Other Ambulatory Visit (HOSPITAL_BASED_OUTPATIENT_CLINIC_OR_DEPARTMENT_OTHER): Payer: Self-pay

## 2022-02-22 MED ORDER — FLUCONAZOLE 150 MG PO TABS
150.0000 mg | ORAL_TABLET | Freq: Every day | ORAL | 0 refills | Status: DC
  Filled 2022-02-22: qty 2, 2d supply, fill #0

## 2022-02-25 ENCOUNTER — Telehealth: Payer: Self-pay | Admitting: Internal Medicine

## 2022-02-25 ENCOUNTER — Other Ambulatory Visit: Payer: Self-pay | Admitting: Urology

## 2022-02-25 NOTE — Telephone Encounter (Signed)
Patient said that on My Chart it states that she has chronic kidney disease.  Dr. Jenny Reichmann has told patient that her kidneys are fine.  She would like someone to call her and discuss this situation with her.  Patient's number:  [phone]

## 2022-02-25 NOTE — Telephone Encounter (Signed)
Patient sees a diagnosis of chronic kidney disease in her chart but states at visit, she was told that her kidneys were fine. Please advise as she is requesting an explanation and I will be happy to inform her.

## 2022-02-25 NOTE — Telephone Encounter (Signed)
What we meant was that pt has chronic kidney dsease stage 3a which is overall stable.  This can be further discussed at her next visit.  No need for changes at this time

## 2022-02-25 NOTE — Telephone Encounter (Signed)
Patient informed and will give [place] an update once she sees urology

## 2022-03-05 NOTE — Patient Instructions (Signed)
SURGICAL WAITING ROOM VISITATION Patients having surgery or a procedure may have no more than 2 support people in the waiting area - these visitors may rotate in the visitor waiting room.   Due to an increase in RSV and influenza rates and associated hospitalizations, children ages 74 and under may not visit patients in [place]. If the patient needs to stay at the hospital during part of their recovery, the visitor guidelines for inpatient rooms apply.  PRE-OP VISITATION  Pre-op nurse will coordinate an appropriate time for 1 support person to accompany the patient in pre-op.  This support person may not rotate.  This visitor will be contacted when the time is appropriate for the visitor to come back in the pre-op area.  Please refer to the [facility] website for the visitor guidelines for Inpatients (after your surgery is over and you are in a regular room).  You are not required to quarantine at this time prior to your surgery. However, you must do this: Hand Hygiene often Do NOT share personal items Notify your provider if you are in close contact with someone who has COVID or you develop fever 100.4 or greater, new onset of sneezing, cough, sore throat, shortness of breath or body aches.  If you test positive for Covid or have been in contact with anyone that has tested positive in the last 10 days please notify you surgeon.    Your procedure is scheduled on:  Tuesday  March 18, 2022  Report to [facility] Main Entrance: [place] entrance where the [employer] is available.   Report to admitting at:  1:00 PM  +++++Call this number if you have any questions or problems the morning of surgery [phone]  DO NOT EAT OR DRINK ANYTHING AFTER MIDNIGHT THE NIGHT PRIOR TO YOUR SURGERY / PROCEDURE.   FOLLOW BOWEL PREP AND ANY ADDITIONAL PRE OP INSTRUCTIONS YOU RECEIVED FROM YOUR SURGEON'S OFFICE!!!   Oral Hygiene is also important to reduce your risk of infection.         Remember - BRUSH YOUR TEETH THE MORNING OF SURGERY WITH YOUR REGULAR TOOTHPASTE  Do NOT smoke after Midnight the night before surgery.  Diabetic Medication:  Glimepiride: Day before surgery: Take as usual with breakfast.  Day Of Your Surgery (03-18-2022): Do NOT TAKE your Glimepiride.   Take ONLY these medicines the morning of surgery with A SIP OF WATER: amlodipine, solifenacin (Vesicare). ???                     You may not have any metal on your body including hair pins, jewelry, and body piercing  Do not wear make-up, lotions, powders, perfumes or deodorant  Do not wear nail polish including gel and S&S, artificial / acrylic nails, or any other type of covering on natural nails including finger and toenails. If you have artificial nails, gel coating, etc., that needs to be removed by a nail salon, Please have this removed prior to surgery. Not doing so may mean that your surgery could be cancelled or delayed if the Surgeon or anesthesia staff feels like they are unable to monitor you safely.   Do not shave 48 hours prior to surgery to avoid nicks in your skin which may contribute to postoperative infections.    Contacts, Hearing Aids, dentures or bridgework may not be worn into surgery. DENTURES WILL BE REMOVED PRIOR TO SURGERY PLEASE DO NOT APPLY "Poly grip" OR ADHESIVES!!!   Patients discharged  on the day of surgery will not be allowed to drive home.  Someone NEEDS to stay with you for the first 24 hours after anesthesia.  Do not bring your home medications to the hospital. The Pharmacy will dispense medications listed on your medication list to you during your admission in the Hospital.  Special Instructions: Bring a copy of your healthcare power of attorney and living will documents the day of surgery, if you wish to have them scanned into your [facility] Records- EPIC  Please read over the following fact sheets you were given: IF YOU HAVE QUESTIONS ABOUT YOUR  PRE-OP INSTRUCTIONS, PLEASE CALL [phone]  ([place])   [place] - Preparing for Surgery Before surgery, you can play an important role.  Because skin is not sterile, your skin needs to be as free of germs as possible.  You can reduce the number of germs on your skin by washing with CHG (chlorahexidine gluconate) soap before surgery.  CHG is an antiseptic cleaner which kills germs and bonds with the skin to continue killing germs even after washing. Please DO NOT use if you have an allergy to CHG or antibacterial soaps.  If your skin becomes reddened/irritated stop using the CHG and inform your nurse when you arrive at Short Stay. Do not shave (including legs and underarms) for at least 48 hours prior to the first CHG shower.  You may shave your face/neck.  Please follow these instructions carefully:  1.  Shower with CHG Soap the night before surgery and the  morning of surgery.  2.  If you choose to wash your hair, wash your hair first as usual with your normal  shampoo.  3.  After you shampoo, rinse your hair and body thoroughly to remove the shampoo.                             4.  Use CHG as you would any other liquid soap.  You can apply chg directly to the skin and wash.  Gently with a scrungie or clean washcloth.  5.  Apply the CHG Soap to your body ONLY FROM THE NECK DOWN.   Do not use on face/ open                           Wound or open sores. Avoid contact with eyes, ears mouth and genitals (private parts).                       Wash face,  Genitals (private parts) with your normal soap.             6.  Wash thoroughly, paying special attention to the area where your  surgery  will be performed.  7.  Thoroughly rinse your body with warm water from the neck down.  8.  DO NOT shower/wash with your normal soap after using and rinsing off the CHG Soap.            9.  Pat yourself dry with a clean towel.            10.  Wear clean pajamas.            11.  Place clean sheets on your bed  the night of your first shower and do not  sleep with pets.  ON THE DAY OF SURGERY : Do not apply any lotions/deodorants  the morning of surgery.  Please wear clean clothes to the hospital/surgery center.    FAILURE TO FOLLOW THESE INSTRUCTIONS MAY RESULT IN THE CANCELLATION OF YOUR SURGERY  PATIENT SIGNATURE_________________________________  NURSE SIGNATURE__________________________________  ________________________________________________________________________

## 2022-03-05 NOTE — Progress Notes (Signed)
COVID Vaccine received:  [x]$  No []$  Yes Date of any COVID positive Test in last 90 days:  None  PCP - Cathlean Cower, MD Cardiologist - Cherlynn Kaiser, MD Oncology- Derek Jack, MD  at [facility].  Chest x-ray -08-08-2021  epic  EKG -  08-08-2021  epic Stress Test -  ECHO -  08-09-2021  epic Cardiac Cath -   PCR screen: []$  Ordered & Completed                      []$   No Order but Needs PROFEND                      [x]$   N/A for this surgery  Surgery Plan:  [x]$  Ambulatory                            []$  Outpatient in bed                            []$  Admit  Anesthesia:    [x]$  General  []$  Spinal                           []$   Choice []$   MAC  Bowel Prep - [x]$  No  []$   Yes _____________  Pacemaker / ICD device [x]$  No []$  Yes        Device order form faxed [x]$  No    []$   Yes      Faxed to:  Spinal Cord Stimulator:[x]$  No []$  Yes      (Remind patient to bring remote DOS) Other Implants:   History of Sleep Apnea? []$  No [x]$  Yes   CPAP used?- [x]$  No []$  Yes    Does the patient monitor blood sugar? []$  No [x]$  Yes  []$  N/A  Patient has: []$  Pre-DM   []$  DM1  [x]$   DM2 Does patient have a [employer] or Dexacom? [x]$  No []$  Yes   Fasting Blood Sugar Ranges-  Checks Blood Sugar -1-2_ times a week Diabetic medications/ instructions: Glimepiride   Blood Thinner / Instructions: none Aspirin Instructions:  none  ERAS Protocol Ordered: [x]$  No  []$  Yes Patient is to be NPO after: Midnight prior  Comments: Patient has a Port-a-cath in her right upper chest. It hasn't been accessed since September 2023.  Activity level: Patient can not climb a flight of stairs without difficulty she would be very SOB.  Patient can perform ADLs without assistance.   Anesthesia review: DM2, OSA (no CPAP), CKD3, Murmur (ECHO 07-2021?mild/ moderate TR & MR) HOH- left ear nerve damage no HAs   Patient denies shortness of breath, fever, cough and chest pain at PAT appointment.  Patient verbalized  understanding and agreement to the Pre-Surgical Instructions that were given to them at this PAT appointment. Patient was also educated of the need to review these PAT instructions again prior to his/her surgery.I reviewed the appropriate phone numbers to call if they have any and questions or concerns.

## 2022-03-06 ENCOUNTER — Other Ambulatory Visit: Payer: Self-pay

## 2022-03-06 ENCOUNTER — Encounter (HOSPITAL_COMMUNITY): Payer: Self-pay

## 2022-03-06 ENCOUNTER — Encounter (HOSPITAL_COMMUNITY)
Admission: RE | Admit: 2022-03-06 | Discharge: 2022-03-06 | Disposition: A | Discharge status: Home / Self Care | Payer: 59 | Source: Ambulatory Visit | Attending: Urology | Admitting: Urology

## 2022-03-06 VITALS — BP 142/62 | HR 72 | Temp 98.0°F | Resp 16 | Ht 63.0 in | Wt 185.0 lb

## 2022-03-06 DIAGNOSIS — Z01812 Encounter for preprocedural laboratory examination: Secondary | ICD-10-CM | POA: Insufficient documentation

## 2022-03-06 DIAGNOSIS — I10 Essential (primary) hypertension: Secondary | ICD-10-CM | POA: Insufficient documentation

## 2022-03-06 DIAGNOSIS — E119 Type 2 diabetes mellitus without complications: Secondary | ICD-10-CM | POA: Insufficient documentation

## 2022-03-06 LAB — BASIC METABOLIC PANEL
Anion gap: 9 (ref 5–15)
BUN: 28 mg/dL — ABNORMAL HIGH (ref 8–23)
CO2: 24 mmol/L (ref 22–32)
Calcium: 10 mg/dL (ref 8.9–10.3)
Chloride: 107 mmol/L (ref 98–111)
Creatinine, Ser: 1.6 mg/dL — ABNORMAL HIGH (ref 0.44–1.00)
GFR, Estimated: 33 mL/min — ABNORMAL LOW (ref >60)
Glucose, Bld: 78 mg/dL (ref 70–99)
Potassium: 4 mmol/L (ref 3.5–5.1)
Sodium: 140 mmol/L (ref 135–145)

## 2022-03-06 LAB — CBC
HCT: 37.9 % (ref 36.0–46.0)
Hemoglobin: 11.9 g/dL — ABNORMAL LOW (ref 12.0–15.0)
MCH: 27.7 pg (ref 26.0–34.0)
MCHC: 31.4 g/dL (ref 30.0–36.0)
MCV: 88.3 fL (ref 80.0–100.0)
Platelets: 201 10*3/uL (ref 150–400)
RBC: 4.29 MIL/uL (ref 3.87–5.11)
RDW: 16.1 % — ABNORMAL HIGH (ref 11.5–15.5)
WBC: 7.1 10*3/uL (ref 4.0–10.5)
nRBC: 0 % (ref 0.0–0.2)

## 2022-03-06 LAB — GLUCOSE, CAPILLARY: Glucose-Capillary: 87 mg/dL (ref 70–99)

## 2022-03-10 ENCOUNTER — Ambulatory Visit: Payer: Medicare Other | Admitting: Internal Medicine

## 2022-03-12 NOTE — H&P (Signed)
CC/HPI: cc: AKI, hydronephrosis   03/12/21: 77 yo woman last seen by Dr. Karsten Ro in June 2021 for detrusor overactivity referred for AKI and hydronephrosis. She has a history of ovarian cancer and is currently undergoing radiation for this. She had a renal ultrasound that showed worsening right hydronephrosis with external compression by pelvic lymph node. She is not having any pain. Her GFR has acutely worsened to 20 with a creatinine of 2.3 from 0.88 three months previously.   03/19/21: With ovarian cancer and right hydronephrosis due to lymphadenopathy underwent ureteral stent placement for AKI with hydronephrosis. Stent was placed last week and she has done well since then. She comes in today because she feels like she had decreased urine output. She has no pain and no hematuria. She did take Vesicare Friday Saturday and Sunday but then stopped when she felt her urine output was less. Her PVR in the office today is approximately 100 cc. No sign of infection on urinalysis however consistent with indwelling stent. She is on a prophylactic antibiotic.   03/21/2021: 77 year old female who has ovarian cancer with lymphadenopathy causing right-sided hydronephrosis that is currently managed with a ureteral stent presents today for follow-up. She was seen a few days ago due to concerns of not urinating much. She is currently on alfuzosin. She states she is on a prophylactic antibiotic but I do not see this in the chart at this time. Her urine has no signs of bacteria or infection but is consistent with a ureteral stent. She is otherwise tolerating the stent well and denies any pain or discomfort. She endorses severe fatigue and reports that she is fairly weak.    06/24/2021: 77 year old woman with a history of ovarian cancer and retroperitoneal mass causing obstruction of right ureter underwent urgent right ureteral stent placement in March 2023. Cr 1.25 from 2.3 prior to stent placement. Mass is still causing  proximal obstruction on recent CT from March 2023. Patient is having intermittent right lower quadrant pain. She overall feels tired from the chemotherapy. She is also struggled with constipation.   09/06/2021: 77 year old woman with stage IIIc ovarian cancer and retroperitoneal mass causing obstruction of right ureter currently managed with right indwelling ureteral stent here for follow-up. Stent was placed in March 2023 after creatinine jumped to 2.3 and was noted to have hydronephrosis on scan. Creatinine has come down and ranges between 1.08-1.43. She just finished 6 cycles of chemotherapy. CT from July showed the retroperitoneal mass and persistent hydronephrosis. She is scheduled for repeat CT scan in approximately 2 weeks.   10/15/2021: Roxi is a 77 year old female whose acquired extrinsic hydronephrosis is currently managed with a right-sided ureteral stent. She underwent stent exchange 3 weeks ago. She has had a total of 6 cycles of chemotherapy. CT imaging from September 27, 2021 shows continued right-sided hydronephrosis with a patent right ureteral stent. She is currently undergoing radiation therapy now. She reports continued fatigue. She denies dysuria, fevers and chills. She is otherwise tolerating the stent fairly well.   02/18/2022: Patient with above-noted history. Recently evaluated by her oncology team at [facility]. CT imaging performed last month showed stable size of the retroperitoneal mass involving the right ureteral and diverting third portion of the duodenum. There is slight increasing nodularity along the lower pelvis on the left side at the level of the bladder near the course of the distal ureter on the left as well as slight worsening of collecting system dilatation on the right side. Creatinine now 1.49 which  has slightly worsened. Pt is c/o increased left lower leg swelling thought to be lymphadenopathy with compression stockings recommended. Her oncology team plans for repeat  evaluation with imaging again in 3 months. Recent visit with primary care saw them place her back on some Solifenacin which she had been on before for management of some increased frequency/urgency. It has been approximately 5 months since her last stent exchange. Primary voiding complaint continues to be nocturia which can sometimes be every 1-2 hours. She is not having dysuria or gross hematuria. No correlating lower back or flank pain/discomfort suggestive of obstructive uropathy. She denies fevers or chills, nausea/vomiting.     ALLERGIES: Cipro hydrocodone - Dizziness Prednisone - Anxiety sulfa - Hives    MEDICATIONS: Airborne  Amlodipine Besilate  Claritin  Glipizide  Metamucil Fiber Thin  Multiple Vitamin  Pantoprazole Sodium PRN  Tramadol Hcl 50 mg tablet 1 tablet PO Q 6 H PRN severe right lower quadrant pain  Tylenol PRN  Vitamin B12     GU PSH: Cystocele Repair Cystoscopy Insert Stent - 10/01/2021, 03/13/2021     NON-GU PSH: Breast Biopsy, Bilateral Cholecystectomy (laparoscopic) Partial Hysterectomy     GU PMH: Hydronephrosis - 10/15/2021, - 09/06/2021, - 06/24/2021, - 03/21/2021, - 03/19/2021, - 03/12/2021 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 I told her that if she finds that this does not allow her to reach her goal we could potentially try a combination of this medication and an anticholinergic. She will return reassessment. - 2021    NON-GU PMH: Anxiety Arthritis Cardiac murmur, unspecified Depression Diabetes Type 2 GERD Gout Hypercholesterolemia Hypertension Sleep Apnea    FAMILY HISTORY: 2 daughters - Daughter 2 sons - Son Hypertension - Mother, Father, Runs in Family sickle cell anemia - Daughter stroke -  Sister   SOCIAL HISTORY: Marital Status: Divorced Preferred Language: English; Ethnicity: Not Hispanic Or Latino; Race: White Current Smoking Status: Patient has never smoked.   Tobacco Use Assessment Completed: Used Tobacco in last 30 days? Has never drank.  Drinks 2 caffeinated drinks per day.    REVIEW OF SYSTEMS:    GU Review Female:   Patient reports frequent urination, hard to postpone urination, get up at night to urinate, leakage of urine, stream starts and stops, and trouble starting your stream. Patient denies burning /pain with urination, have to strain to urinate, and being pregnant.  Gastrointestinal (Upper):   Patient denies nausea, vomiting, and indigestion/ heartburn.  Gastrointestinal (Lower):   Patient reports diarrhea and constipation.   Constitutional:   Patient reports night sweats. Patient denies fever, weight loss, and fatigue.  Skin:   Patient denies skin rash/ lesion and itching.  Eyes:   Patient denies blurred vision and double vision.  Ears/ Nose/ Throat:   Patient denies sore throat and sinus problems.  Hematologic/Lymphatic:   Patient denies swollen glands and easy bruising.  Cardiovascular:   Patient denies leg swelling and chest pains.  Respiratory:   Patient denies cough and shortness of breath.  Endocrine:   Patient denies excessive thirst.  Musculoskeletal:   Patient reports back pain and joint pain.   Neurological:   Patient denies headaches and dizziness.  Psychologic:   Patient denies depression and anxiety.   VITAL SIGNS:      02/18/2022  02:01 PM  BP 125/75 mmHg  Pulse 86 /min  Temperature 98.3 F / 36.8 C   MULTI-SYSTEM PHYSICAL EXAMINATION:    Constitutional: Well-nourished. No physical deformities. Normally developed. Good grooming.  Neck: Neck symmetrical, not swollen. Normal tracheal position.  Respiratory: No labored breathing, no use of accessory muscles.   Cardiovascular: Normal temperature, normal extremity pulses, no swelling, no  varicosities. Left greater than right lower extremity swelling  Skin: No paleness, no jaundice, no cyanosis. No lesion, no ulcer, no rash.  Neurologic / Psychiatric: Oriented to time, oriented to place, oriented to person. No depression, no anxiety, no agitation.  Gastrointestinal: No mass, no tenderness, no rigidity, non obese abdomen. No CVA or flank tenderness.  Musculoskeletal: Normal gait and station of head and neck.     Complexity of Data:  Source Of History:  Patient, Medical Record Summary  Lab Test Review:   CBC with Diff, CMP  Records Review:   Previous Doctor Records, Previous Hospital Records, Previous Patient Records  Urine Test Review:   Urinalysis, Urine Culture  Urodynamics Review:   Review Bladder Scan  X-Ray Review: C.T. Abdomen/Pelvis: Reviewed Films. Reviewed Report.     02/18/22  Urinalysis  Urine Appearance Cloudy   Urine Color Yellow   Urine Glucose Neg mg/dL  Urine Bilirubin Neg mg/dL  Urine Ketones Neg mg/dL  Urine Specific Gravity 1.020   Urine Blood Trace ery/uL  Urine pH 5.5   Urine Protein Trace mg/dL  Urine Urobilinogen 0.2 mg/dL  Urine Nitrites Neg   Urine Leukocyte Esterase 3+ leu/uL  Urine WBC/hpf 40 - 60/hpf   Urine RBC/hpf 0 - 2/hpf   Urine Epithelial Cells 10 - 20/hpf   Urine Bacteria Many (>50/hpf)   Urine Mucous Not Present   Urine Yeast NS (Not Seen)   Urine Trichomonas Not Present   Urine Cystals NS (Not Seen)   Urine Casts NS (Not Seen)   Urine Sperm Not Present   Notes:                     CLINICAL DATA: Ovarian cancer * Tracking Code: BO *   EXAM:  CT ABDOMEN AND PELVIS WITH CONTRAST   TECHNIQUE:  Multidetector CT imaging of the abdomen and pelvis was performed  using the standard protocol following bolus administration of  intravenous contrast.   RADIATION DOSE REDUCTION: This exam was performed according to the  departmental dose-optimization program which includes automated  exposure control, adjustment of the mA and/or  kV according to  patient size and/or use of iterative reconstruction technique.   CONTRAST: 70m OMNIPAQUE IOHEXOL 300 MG/ML SOLN   COMPARISON: 11/22/2021 and older   FINDINGS:  Lower chest: Mild linear opacity along the lung bases likely scar or  atelectasis. No pleural effusion. Mild basilar atelectasis. Moderate  hiatal hernia.   Hepatobiliary: Focal low-density seen in the liver adjacent to the  falciform ligament consistent with focal fat deposition. No  space-occupying liver lesion. Previous cholecystectomy.   Pancreas: Mild pancreatic atrophy without abnormal enhancement, mass  or ductal dilatation. There is some fatty infiltration as well  towards the uncinate process.   Spleen: Spleen is nonenlarged with preserved enhancement. Small  splenule.   Adrenals/Urinary Tract: Adrenal glands are unremarkable.   No left-sided enhancing renal lesion. Stable slight ectasia of the  collecting system of the left kidney.   There is a right-sided indwelling ureteral stent. Proximal pigtail  in the renal pelvis and distal into the bladder. There continues to  be moderate collecting system dilatation which is progressive from  the prior examination. This is likely related to the retroperitoneal  mass involving the course of the right ureter. Slightly delayed  enhancement to the right kidney compared to left as well cyst of the  true functional level of obstruction. The bladder is diffusely  thickened wall with some stranding, unchanged from previous.   Stomach/Bowel: Large bowel has a normal course and caliber with  scattered stool. Normal appendix. Again moderate hiatal hernia. The  small bowel is nondilated. The course of third portion duodenal does  abut the retroperitoneal mass. True involvement is possible but the  again the bowel is nondilated.   Vascular/Lymphatic: Normal caliber aorta with some scattered  atherosclerotic plaque. The retroareolar mass which involves the   right ureter and abuts the duodenal on the prior measured 6.4 x 4.2  cm and today measures 6.6 by 4.0 cm, similar when adjusting for  technique. This does cause mass effect along the IVC. Pelvic veins  appear to be enhancing. Additional left para-aortic lymph node which  previously measured 11 mm, today measures 13 by 11 mm, unchanged. No  additional separate discrete lymph node enlargement. There are  several scattered collaterals.   Reproductive: Absence of the uterus and ovaries.   Other: No frank ascites. There is some retroperitoneal stranding  including extending into the presacral space. There is slight  increasing nodularity along the low pelvis on the left side at the  level of the bladder near the course of the distal ureter on the  left, image 62. Would recommend attention this area on follow-up.   Musculoskeletal: No acute or significant osseous findings.   IMPRESSION:  1. Stable size of the retroperitoneal mass which involves the right  ureter and abuts the third portion of the duodenum.  2. Stable left para-aortic lymph node.  3. There is slight increasing nodularity along the low pelvis on the  left side at the level of the bladder near the course of the distal  ureter on the left. Would recommend attention this area on  follow-up.  4. There continues to be moderate collecting system dilatation which  is progressive from the prior examination. This is likely related to  the retroperitoneal mass involving the course of the right ureter.  There is a right-sided indwelling ureteral stent.  5. Stable slight ectasia of the collecting system of the left  kidney.  6. Moderate hiatal hernia.  7. Aortic atherosclerosis.   Aortic Atherosclerosis (ICD10-I70.0).    Electronically Signed  By: Jill Side M.D.  On: 02/05/2022 11:35     PROCEDURES:          Urinalysis w/Scope Dipstick Dipstick Cont'd Micro  Color: Yellow Bilirubin: Neg mg/dL WBC/hpf: 40 - 60/hpf   Appearance: Cloudy Ketones: Neg mg/dL RBC/hpf: 0 - 2/hpf  Specific Gravity: 1.020 Blood: Trace ery/uL Bacteria: Many (>50/hpf)  pH: 5.5 Protein: Trace mg/dL Cystals: NS (Not Seen)  Glucose: Neg mg/dL Urobilinogen: 0.2 mg/dL Casts: NS (Not Seen)    Nitrites: Neg Trichomonas: Not Present    Leukocyte Esterase: 3+ leu/uL Mucous: Not Present      Epithelial Cells: 10 - 20/hpf      Yeast: NS (Not Seen)      Sperm: Not Present    ASSESSMENT:      ICD-10 Details  1 GU:   Hydronephrosis - N13.0 Chronic, Threat to Bodily Function  2   Nocturia - R35.1 Chronic, Stable   PLAN:  Orders Labs Urine Culture          Schedule Return Visit/Planned Activity: Next Available Appointment - Follow up MD, Schedule Surgery          Document Letter(s):  Created for Patient: Clinical Summary         Notes:   Presentation reviewed with Dr Claudia Desanctis. Nephrostomy tube was again discussed but patient remains adamant against this. She is due for stent exchange and will be scheduled appropriately in the near future for such. She'll have (B) RPG done as well. She can continue Solifenacin prescribed by primary care for management of underlying OAB symptoms. Encouraged lower extremity elevation and use of compression stockings for management of the left greater than right lower extremity swelling. She will stop by the lab today prior to leaving so a urine culture can be sent. Not exhibiting any overt signs/symptoms of a UTI at present time. I am going to turn in paperwork to the scheduler who within contact her to schedule the stent exchange.

## 2022-03-18 ENCOUNTER — Other Ambulatory Visit: Payer: Self-pay

## 2022-03-18 ENCOUNTER — Ambulatory Visit (HOSPITAL_COMMUNITY): Payer: 59 | Admitting: Physician Assistant

## 2022-03-18 ENCOUNTER — Encounter (HOSPITAL_COMMUNITY)
Admission: RE | Disposition: A | Discharge status: Home / Self Care | Payer: Self-pay | Source: Ambulatory Visit | Attending: Urology

## 2022-03-18 ENCOUNTER — Encounter (HOSPITAL_COMMUNITY): Payer: Self-pay | Admitting: Urology

## 2022-03-18 ENCOUNTER — Ambulatory Visit (HOSPITAL_COMMUNITY)
Admission: RE | Admit: 2022-03-18 | Discharge: 2022-03-18 | Disposition: A | Discharge status: Home / Self Care | Payer: 59 | Source: Ambulatory Visit | Attending: Urology | Admitting: Urology

## 2022-03-18 ENCOUNTER — Ambulatory Visit (HOSPITAL_COMMUNITY): Payer: 59

## 2022-03-18 ENCOUNTER — Ambulatory Visit (HOSPITAL_BASED_OUTPATIENT_CLINIC_OR_DEPARTMENT_OTHER): Payer: 59 | Admitting: Anesthesiology

## 2022-03-18 DIAGNOSIS — N289 Disorder of kidney and ureter, unspecified: Secondary | ICD-10-CM | POA: Diagnosis not present

## 2022-03-18 DIAGNOSIS — N131 Hydronephrosis with ureteral stricture, not elsewhere classified: Secondary | ICD-10-CM

## 2022-03-18 DIAGNOSIS — C569 Malignant neoplasm of unspecified ovary: Secondary | ICD-10-CM | POA: Diagnosis not present

## 2022-03-18 DIAGNOSIS — K449 Diaphragmatic hernia without obstruction or gangrene: Secondary | ICD-10-CM | POA: Insufficient documentation

## 2022-03-18 DIAGNOSIS — I10 Essential (primary) hypertension: Secondary | ICD-10-CM | POA: Diagnosis not present

## 2022-03-18 DIAGNOSIS — N1330 Unspecified hydronephrosis: Secondary | ICD-10-CM | POA: Diagnosis not present

## 2022-03-18 DIAGNOSIS — E119 Type 2 diabetes mellitus without complications: Secondary | ICD-10-CM

## 2022-03-18 DIAGNOSIS — Z923 Personal history of irradiation: Secondary | ICD-10-CM | POA: Diagnosis not present

## 2022-03-18 DIAGNOSIS — Z9989 Dependence on other enabling machines and devices: Secondary | ICD-10-CM

## 2022-03-18 DIAGNOSIS — Z9221 Personal history of antineoplastic chemotherapy: Secondary | ICD-10-CM | POA: Diagnosis not present

## 2022-03-18 DIAGNOSIS — I700 Atherosclerosis of aorta: Secondary | ICD-10-CM | POA: Diagnosis not present

## 2022-03-18 DIAGNOSIS — G4733 Obstructive sleep apnea (adult) (pediatric): Secondary | ICD-10-CM | POA: Diagnosis not present

## 2022-03-18 DIAGNOSIS — F419 Anxiety disorder, unspecified: Secondary | ICD-10-CM

## 2022-03-18 HISTORY — PX: CYSTOSCOPY W/ URETERAL STENT PLACEMENT: SHX1429

## 2022-03-18 LAB — GLUCOSE, CAPILLARY
Glucose-Capillary: 122 mg/dL — ABNORMAL HIGH (ref 70–99)
Glucose-Capillary: 127 mg/dL — ABNORMAL HIGH (ref 70–99)

## 2022-03-18 SURGERY — CYSTOSCOPY, WITH RETROGRADE PYELOGRAM AND URETERAL STENT INSERTION
Anesthesia: General | Site: Ureter | Laterality: Bilateral

## 2022-03-18 MED ORDER — FENTANYL CITRATE (PF) 100 MCG/2ML IJ SOLN
INTRAMUSCULAR | Status: DC | PRN
  Administered 2022-03-18: 50 ug via INTRAVENOUS

## 2022-03-18 MED ORDER — DEXAMETHASONE SODIUM PHOSPHATE 10 MG/ML IJ SOLN
INTRAMUSCULAR | Status: AC
  Filled 2022-03-18: qty 1

## 2022-03-18 MED ORDER — PROPOFOL 10 MG/ML IV BOLUS
INTRAVENOUS | Status: DC | PRN
  Administered 2022-03-18: 40 mg via INTRAVENOUS
  Administered 2022-03-18: 160 mg via INTRAVENOUS

## 2022-03-18 MED ORDER — FENTANYL CITRATE (PF) 100 MCG/2ML IJ SOLN
INTRAMUSCULAR | Status: AC
  Filled 2022-03-18: qty 2

## 2022-03-18 MED ORDER — PROPOFOL 10 MG/ML IV BOLUS
INTRAVENOUS | Status: AC
  Filled 2022-03-18: qty 20

## 2022-03-18 MED ORDER — ACETAMINOPHEN 325 MG PO TABS: 325.0000 mg | ORAL_TABLET | ORAL | Status: DC | PRN

## 2022-03-18 MED ORDER — CEFAZOLIN SODIUM-DEXTROSE 2-4 GM/100ML-% IV SOLN
2.0000 g | INTRAVENOUS | Status: AC
  Administered 2022-03-18: 2 g via INTRAVENOUS
  Filled 2022-03-18: qty 100

## 2022-03-18 MED ORDER — PHENYLEPHRINE 80 MCG/ML (10ML) SYRINGE FOR IV PUSH (FOR BLOOD PRESSURE SUPPORT)
PREFILLED_SYRINGE | INTRAVENOUS | Status: DC | PRN
  Administered 2022-03-18: 80 ug via INTRAVENOUS

## 2022-03-18 MED ORDER — FENTANYL CITRATE PF 50 MCG/ML IJ SOSY: 25.0000 ug | PREFILLED_SYRINGE | INTRAMUSCULAR | Status: DC | PRN

## 2022-03-18 MED ORDER — LACTATED RINGERS IV SOLN: INTRAVENOUS | Status: DC

## 2022-03-18 MED ORDER — ONDANSETRON HCL 4 MG/2ML IJ SOLN
INTRAMUSCULAR | Status: DC | PRN
  Administered 2022-03-18: 4 mg via INTRAVENOUS

## 2022-03-18 MED ORDER — SODIUM CHLORIDE 0.9 % IR SOLN
Status: DC | PRN
  Administered 2022-03-18: 3000 mL

## 2022-03-18 MED ORDER — IOHEXOL 300 MG/ML  SOLN
INTRAMUSCULAR | Status: DC | PRN
  Administered 2022-03-18: 50 mL via URETHRAL

## 2022-03-18 MED ORDER — ACETAMINOPHEN 160 MG/5ML PO SOLN: 325.0000 mg | ORAL | Status: DC | PRN

## 2022-03-18 MED ORDER — PHENYLEPHRINE 80 MCG/ML (10ML) SYRINGE FOR IV PUSH (FOR BLOOD PRESSURE SUPPORT)
PREFILLED_SYRINGE | INTRAVENOUS | Status: AC
  Filled 2022-03-18: qty 20

## 2022-03-18 MED ORDER — CHLORHEXIDINE GLUCONATE 0.12 % MT SOLN: 15.0000 mL | Freq: Once | OROMUCOSAL | Status: DC

## 2022-03-18 MED ORDER — OXYCODONE HCL 5 MG/5ML PO SOLN: 5.0000 mg | Freq: Once | ORAL | Status: AC | PRN

## 2022-03-18 MED ORDER — DEXAMETHASONE SODIUM PHOSPHATE 10 MG/ML IJ SOLN
INTRAMUSCULAR | Status: DC | PRN
  Administered 2022-03-18: 5 mg via INTRAVENOUS

## 2022-03-18 MED ORDER — ONDANSETRON HCL 4 MG/2ML IJ SOLN
INTRAMUSCULAR | Status: AC
  Filled 2022-03-18: qty 2

## 2022-03-18 MED ORDER — ORAL CARE MOUTH RINSE: 15.0000 mL | Freq: Once | OROMUCOSAL | Status: DC

## 2022-03-18 MED ORDER — OXYCODONE HCL 5 MG PO TABS: 5.0000 mg | ORAL_TABLET | Freq: Once | ORAL | Status: AC | PRN

## 2022-03-18 MED ORDER — MEPERIDINE HCL 50 MG/ML IJ SOLN: 6.2500 mg | INTRAMUSCULAR | Status: DC | PRN

## 2022-03-18 MED ORDER — LIDOCAINE 2% (20 MG/ML) 5 ML SYRINGE
INTRAMUSCULAR | Status: DC | PRN
  Administered 2022-03-18: 100 mg via INTRAVENOUS

## 2022-03-18 MED ORDER — OXYCODONE HCL 5 MG PO TABS
ORAL_TABLET | ORAL | Status: AC
  Administered 2022-03-18: 5 mg via ORAL
  Filled 2022-03-18: qty 1

## 2022-03-18 MED ORDER — ONDANSETRON HCL 4 MG/2ML IJ SOLN: 4.0000 mg | Freq: Once | INTRAMUSCULAR | Status: DC | PRN

## 2022-03-18 MED ORDER — LIDOCAINE HCL (PF) 2 % IJ SOLN
INTRAMUSCULAR | Status: AC
  Filled 2022-03-18: qty 5

## 2022-03-18 SURGICAL SUPPLY — 14 items
BAG URO CATCHER STRL LF (MISCELLANEOUS) ×1 IMPLANT
CATH URETL OPEN END 6FR 70 (CATHETERS) ×1 IMPLANT
CLOTH BEACON ORANGE TIMEOUT ST (SAFETY) ×1 IMPLANT
GLOVE BIO SURGEON STRL SZ 6.5 (GLOVE) ×1 IMPLANT
GOWN STRL REUS W/ TWL LRG LVL3 (GOWN DISPOSABLE) ×1 IMPLANT
GOWN STRL REUS W/TWL LRG LVL3 (GOWN DISPOSABLE) ×1
GUIDEWIRE STR DUAL SENSOR (WIRE) ×1 IMPLANT
KIT TURNOVER KIT A (KITS) IMPLANT
MANIFOLD NEPTUNE II (INSTRUMENTS) ×1 IMPLANT
PACK CYSTO (CUSTOM PROCEDURE TRAY) ×1 IMPLANT
STENT URET 6FRX24 CONTOUR (STENTS) IMPLANT
STENT URET 6FRX26 CONTOUR (STENTS) IMPLANT
TUBING CONNECTING 10 (TUBING) ×1 IMPLANT
TUBING UROLOGY SET (TUBING) IMPLANT

## 2022-03-18 NOTE — Interval H&P Note (Signed)
History and Physical Interval Note: Possible left ureteral stent placement. 03/18/2022 1:07 PM  Laura [name]  has presented today for surgery, with the diagnosis of HYDRONEPHROSIS.  The various methods of treatment have been discussed with the patient and family. After consideration of risks, benefits and other options for treatment, the patient has consented to  Procedure(s) with comments: CYSTOSCOPY WITH BILATERAL RETROGRADE PYELOGRAM/RIGHT URETERAL STENT EXCHANGE (Bilateral) - 1HR as a surgical intervention.  The patient's history has been reviewed, patient examined, no change in status, stable for surgery.  I have reviewed the patient's chart and labs.  Questions were answered to the patient's satisfaction.     Karynn Deblasi D Dannis Deroche

## 2022-03-18 NOTE — Anesthesia Procedure Notes (Signed)
Procedure Name: LMA Insertion Date/Time: 03/18/2022 1:35 PM  Performed by: West Pugh, CRNAPre-anesthesia Checklist: Patient identified, Emergency Drugs available, Suction available, Patient being monitored and Timeout performed Patient Re-evaluated:Patient Re-evaluated prior to induction Oxygen Delivery Method: Circle system utilized Preoxygenation: Pre-oxygenation with 100% oxygen Induction Type: IV induction Ventilation: Mask ventilation without difficulty LMA: LMA inserted LMA Size: 4.0 Number of attempts: 1 Placement Confirmation: positive ETCO2 Tube secured with: Tape Dental Injury: Teeth and Oropharynx as per pre-operative assessment

## 2022-03-18 NOTE — Discharge Instructions (Signed)

## 2022-03-18 NOTE — Anesthesia Preprocedure Evaluation (Signed)
Anesthesia Evaluation  Patient identified by MRN, date of birth, ID band Patient awake    Reviewed: Allergy & Precautions, NPO status , Patient's Chart, lab work & pertinent test results  History of Anesthesia Complications (+) Family history of anesthesia reaction  Airway Mallampati: I  TM Distance: >3 FB Neck ROM: Full    Dental  (+) Missing, Dental Advisory Given,    Pulmonary sleep apnea and Continuous Positive Airway Pressure Ventilation    Pulmonary exam normal breath sounds clear to auscultation       Cardiovascular hypertension, Pt. on medications + DOE  Normal cardiovascular exam+ Valvular Problems/Murmurs  Rhythm:Regular Rate:Normal  Echo 07/20/21 1. Left ventricular ejection fraction, by estimation, is 65 to 70%. The left ventricle has normal function. The left ventricle has no regional wall motion abnormalities. There is mild left ventricular hypertrophy. Left ventricular diastolic parameters are consistent with Grade I diastolic dysfunction (impaired relaxation). Strain not performed.  2. Right ventricular systolic function is normal. The right ventricular size is normal. There is normal pulmonary artery systolic pressure. The estimated right ventricular systolic pressure is 123456 mmHg.  3. The mitral valve is normal in structure. Trivial mitral valve regurgitation. No evidence of mitral stenosis.  4. Tricuspid valve regurgitation is mild to moderate and eccentric.  5. The aortic valve is tricuspid. Aortic valve regurgitation is not visualized. No aortic stenosis is present.  6. The inferior vena cava is normal in size with greater than 50% respiratory variability, suggesting right atrial pressure of 3 mmHg.   EKG 08/08/21 NSR, low voltage   Neuro/Psych   Anxiety     Peripheral neuropathy    GI/Hepatic ,GERD  Medicated,,IBS   Endo/Other  diabetes, Well Controlled, Type 2, Oral Hypoglycemic Agents   Hyperlipidemia Obesity  Renal/GU Renal diseaseRight hydronephrosis Indwelling right ureteral stent  negative genitourinary   Musculoskeletal  (+) Arthritis , Osteoarthritis,  Hx/o left frozen shoulder   Abdominal  (+) + obese  Peds  Hematology  (+) Blood dyscrasia, anemia   Anesthesia Other Findings   Reproductive/Obstetrics Ovarian Ca S/P RT and oophorectomy with debulking procedure                             Anesthesia Physical Anesthesia Plan  ASA: 3  Anesthesia Plan: General   Post-op Pain Management: Minimal or no pain anticipated   Induction: Intravenous  PONV Risk Score and Plan: 4 or greater and Treatment may vary due to age or medical condition, Ondansetron and Dexamethasone  Airway Management Planned: LMA  Additional Equipment: None  Intra-op Plan:   Post-operative Plan: Extubation in OR  Informed Consent: I have reviewed the patients History and Physical, chart, labs and discussed the procedure including the risks, benefits and alternatives for the proposed anesthesia with the patient or authorized representative who has indicated his/her understanding and acceptance.     Dental advisory given  Plan Discussed with: CRNA and Anesthesiologist  Anesthesia Plan Comments:         Anesthesia Quick Evaluation

## 2022-03-18 NOTE — Anesthesia Postprocedure Evaluation (Signed)
Anesthesia Post Note  Patient: Laura [name]  Procedure(s) Performed: CYSTOSCOPY WITH BILATERAL RETROGRADE PYELOGRAM/RIGHT URETERAL STENT EXCHANGE (Bilateral: Ureter)     Patient location during evaluation: PACU Anesthesia Type: General Level of consciousness: awake and alert Pain management: pain level controlled Vital Signs Assessment: post-procedure vital signs reviewed and stable Respiratory status: spontaneous breathing, nonlabored ventilation, respiratory function stable and patient connected to nasal cannula oxygen Cardiovascular status: blood pressure returned to baseline and stable Postop Assessment: no apparent nausea or vomiting Anesthetic complications: no   No notable events documented.  Last Vitals:  Vitals:   03/18/22 1518 03/18/22 1537  BP: (!) 146/105 (!) 157/82  Pulse: 71   Resp:    Temp: 36.7 C   SpO2: 98% 100%    Last Pain:  Vitals:   03/18/22 1537  TempSrc:   PainSc: 3                  Prayan Ulin

## 2022-03-18 NOTE — Transfer of Care (Signed)
Immediate Anesthesia Transfer of Care Note  Patient: Laura [name]  Procedure(s) Performed: CYSTOSCOPY WITH BILATERAL RETROGRADE PYELOGRAM/RIGHT URETERAL STENT EXCHANGE (Bilateral: Ureter)  Patient Location: PACU  Anesthesia Type:General  Level of Consciousness: awake, alert , oriented, and patient cooperative  Airway & Oxygen Therapy: Patient Spontanous Breathing and Patient connected to nasal cannula oxygen  Post-op Assessment: Report given to RN and Post -op Vital signs reviewed and stable  Post vital signs: Reviewed and stable  Last Vitals:  Vitals Value Taken Time  BP    Temp    Pulse    Resp    SpO2      Last Pain:  Vitals:   03/18/22 1243  TempSrc:   PainSc: 0-No pain         Complications: No notable events documented.

## 2022-03-18 NOTE — Progress Notes (Addendum)
Left message for patient about surgery time to arrive at 1200  9:09 AM patient is aware to arrive at 1200

## 2022-03-18 NOTE — Op Note (Signed)
Operative Note  Preoperative diagnosis:  1.  Right hydronephrosis due to extrinsic compression from malignancy  Postoperative diagnosis: 1.  Same  Procedure(s): 1.  Cystoscopy, right retrograde pyelogram, right ureteral stent exchange 2.  Left retrograde pyelogram  Surgeon: Jacalyn Lefevre, MD  Assistants:  None  Anesthesia:  General  Complications:  None  EBL: Minimal  Specimens: 1.  None  Drains/Catheters: 1.  6Fr x 26cm right ureteral stent  Intraoperative findings:   Normal urethra Bilateral orthotopic ureteral orifices Right moderate to severe hydronephrosis with tortuosity of the ureter Left normal retrograde pyelogram Normal bladder mucosa cystoscopy  Indication:  Laura [name] is a 77 y.o. female with a history of ovarian cancer found to have right hydronephrosis with AKI due to extrinsic compression from malignancy.  Patient's hydronephrosis been managed with a right indwelling ureteral stent.  Although the stent appears to be working somewhat we have discussed a percutaneous nephrostomy tube multiple times which patient continues to decline.  Description of procedure:  After risks and benefits the procedure discussed with the patient, and informed consent was obtained.  The patient taken the operating placed in supine position.  Anesthesia induced and fracture administered.  The patient was then repositioned in the dorsal lithotomy position. She was prepped and draped in the usual sterile fashion and a time out was performed.    A 21 French rigid cystoscope was placed in the urethral meatus and advanced in the bladder and direct visualization.  The existing right ureteral stent was grasped with a graspers and removed via the urethral meatus.  It was examined and deemed to be intact and then discarded.  Next an open-ended ureteral catheter was used to cannulate the right ureteral orifice and a retrograde pyelogram was obtained.  There was tortuosity noted of the  proximal ureter with moderate to severe hydronephrosis on retrograde pyelogram.  A 0.38 wire was then advanced through the open-ended ureteral catheter and the ureteral catheter was removed.  A 6 French by 24 stent was then placed over the wire with fluoroscopic guidance and under direct visualization via cystoscopy.  Due to the amount of hydronephrosis the decision was made to remove the stent and placed a longer stent.  A 6 French by 26 cm double-J stent without a tether was then placed in standard fashion.  Position of this was confirmed with fluoroscopy and under direct visualization.  Attention then turned to the left ureteral orifice which was then cannulated with an open-ended ureteral catheter.  A retrograde pyelogram was obtained which did not show any hydronephrosis or other area of concern.  The open-ended ureteral catheter was removed.  The patient's bladder was decompressed.  The patient emerged from anesthesia and sent to the PACU in stable condition.  Plan:  Follow up in 3 months

## 2022-03-19 ENCOUNTER — Encounter (HOSPITAL_COMMUNITY): Payer: Self-pay | Admitting: Urology

## 2022-03-20 NOTE — Progress Notes (Signed)
  Care Coordination Note  03/20/2022 Name: Laura [name] MRN: [id number] DOB: December 01, 1945  Laura [name] is a 77 y.o. year old female who is a primary care patient of Biagio Borg, MD and is actively engaged with the care management team. I reached out to [name] by phone today to assist with re-scheduling a follow up visit with the Licensed Clinical Social Worker  Follow up plan: Telephone appointment with care management team member scheduled for: 03/21/2022  [place], [place] Direct Dial: [phone]

## 2022-03-21 ENCOUNTER — Ambulatory Visit: Payer: Self-pay | Admitting: Licensed Clinical Social Worker

## 2022-03-21 NOTE — Patient Outreach (Signed)
  Care Coordination  Follow Up Visit Note   03/21/2022 Name: Laura [name] MRN: [id number] DOB: 1945/12/31  Laura [name] is a 77 y.o. year old female who sees Biagio Borg, MD for primary care. I spoke with  Laura [name] by phone today.  What matters to the patients health and wellness today?  Managing symptoms of anxiety and understanding her chronic health concerns.  Patient is experiencing increased symptoms of anxiety related to not fulling understanding what is causing swelling in her legs. She recently has a procedure and needs help connecting all of the dots with her health.  Patient would like RN Care Manager support with navigating this concern.   Goals Addressed             This Visit's Progress    Care Coordination Activiites Reduce symptoms of anxiety       Activities and task to complete in order to accomplish goals.   Start / continue relaxed breathing 3 times daily Keep all upcoming appointment discussed today Continue with compliance of taking medication prescribed by Doctor I have scheduled a phone appointment with the [place] she will provide educational informational and assist you with managing your health needs         SDOH assessments and interventions completed:  Yes  SDOH Interventions Today    Flowsheet Row Most Recent Value  SDOH Interventions   Stress Interventions Provide Counseling        Care Coordination Interventions:  Yes, provided  Interventions Today    Flowsheet Row Most Recent Value  Chronic Disease   Chronic disease during today's visit Hypertension (HTN), Diabetes, Chronic Kidney Disease/End Stage Renal Disease (ESRD)  General Interventions   General Interventions Discussed/Reviewed Referral to Nurse, General Interventions Reviewed  [collaboration with RN Care Manager : added to schedule 3/11]  Laura [name] Reviewed, Coping Strategies, Anxiety  [solution  focused, emotional support, relaxed breathing]       Follow up plan: Follow up call scheduled for 04/03/22    Encounter Outcome:  Pt. Visit Completed   Laura [name], [place] [phone]

## 2022-03-21 NOTE — Patient Instructions (Signed)
Visit Information  Thank you for taking time to visit with me today. Please don't hesitate to contact me if I can be of assistance to you.   Following are the goals we discussed today:   Goals Addressed             This Visit's Progress    Care Coordination Activiites Reduce symptoms of anxiety       Activities and task to complete in order to accomplish goals.   Start / continue relaxed breathing 3 times daily Keep all upcoming appointment discussed today Continue with compliance of taking medication prescribed by Doctor I have scheduled a phone appointment with the [place] she will provide educational informational and assist you with managing your health needs          Our next appointment is by telephone on 04/03/22   Please call the care guide team at [phone] if you need to cancel or reschedule your appointment.   If you are experiencing a Mental Health or [place] or need someone to talk to, please call 1-800-273-TALK (toll free, 24 hour hotline)   Patient verbalizes understanding of instructions and care plan provided today and agrees to view in [place]. Active MyChart status and patient understanding of how to access instructions and care plan via MyChart confirmed with patient.     [name], [place] [phone]

## 2022-03-24 ENCOUNTER — Ambulatory Visit: Payer: Self-pay

## 2022-03-24 NOTE — Patient Instructions (Signed)
Visit Information  Thank you for taking time to visit with me today. Please don't hesitate to contact me if I can be of assistance to you.   Following are the goals we discussed today:   Goals Addressed             This Visit's Progress    Assistance with manageing health concerns       Interventions Today    Flowsheet Row Most Recent Value  Chronic Disease   Chronic disease during today's visit Diabetes, Other  [cancer, lynphadenopathy]  General Interventions   General Interventions Discussed/Reviewed General Interventions Discussed, Communication with  Doctor Visits Discussed/Reviewed Doctor Visits Discussed  [upcoming appointments discussed. encouraged to contact provider with health questions/concerns.]  Communication with PCP/Specialists, Social Work  Exercise Interventions   Exercise Discussed/Reviewed Physical Activity  Physical Activity Discussed/Reviewed Physical Activity Discussed  Education Interventions   Education Provided Provided Education  [encouraged to wear compression stockings as recommended by provider]  Provided Verbal Education On Mental Health/Coping with Illness, Exercise, Other  [continue to attend barry joyce cancer support group. discussed the importance of excercise in coping with illness and mental health. encouraged to continue to do the coping strategies recommended by Liane Comber Moore.]  Mental Health Interventions   Mental Health Discussed/Reviewed Anxiety, Depression, Coping Strategies  [active listening, reflective listening]  Nutrition Interventions   Nutrition Discussed/Reviewed Nutrition Discussed  [encouraged to continue to eat healthy]  Pharmacy Interventions   Pharmacy Dicussed/Reviewed Pharmacy Topics Discussed            Our next appointment is by telephone on 04/24/22 at 11:30 am  Please call the care guide team at [phone] if you need to cancel or reschedule your appointment.   If you are experiencing a Mental Health or  [place] or need someone to talk to, please call the Suicide and Crisis Lifeline: [place], RN, MSN, BSN, [place] [phone]

## 2022-03-24 NOTE — Patient Outreach (Signed)
  Care Coordination   Initial Visit Note   03/24/2022 Name: Laura [name] MRN: [id number] DOB: Jun 21, 1945  Laura [name] is a 77 y.o. year old female who sees Biagio Borg, MD for primary care. I spoke with  [name] by phone today.  What matters to the patients health and wellness today?  Ms. Danielski expresses she wanted to speak with RNCM because she felt herself beginning to get "depressed". PHQ2 2 and PHQ9 8. Patient is receptive to talk therapy, but does not want to take medications. She states she is also going to continue attending [facility] group. She states that LCSW. She reports she felt an anxiety attacks recently revolving around procedures.she states she took a xanax prior to last procedure, which she reports helped. Ms. Knick provided report regarding the test/procedures detailing her complex medication condition. The left leg swelling is a primary concern for patient and questions the causation.  She reports her blood sugars are good.  Goals Addressed             This Visit's Progress    Assistance with manageing health concerns       Interventions Today    Flowsheet Row Most Recent Value  Chronic Disease   Chronic disease during today's visit Diabetes, Other  [cancer, lynphadenopathy]  General Interventions   General Interventions Discussed/Reviewed General Interventions Discussed, Communication with  Doctor Visits Discussed/Reviewed Doctor Visits Discussed  [place] appointments discussed. encouraged to contact provider with health questions/concerns.]  Communication with PCP/Specialists, Social Work  Exercise Interventions   Exercise Discussed/Reviewed Physical Activity  Physical Activity Discussed/Reviewed Physical Activity Discussed  Education Interventions   Education Provided Provided Education  [encouraged to wear compression stockings as recommended by provider]  Provided Verbal Education On Mental Health/Coping with Illness, Exercise, Other   [continue to attend barry joyce cancer support group. discussed the importance of excercise in coping with illness and mental health. encouraged to continue to do the coping strategies recommended by Liane Comber Moore.]  Mental Health Interventions   Mental Health Discussed/Reviewed Anxiety, Depression, Coping Strategies  [active listening, reflective listening]  Nutrition Interventions   Nutrition Discussed/Reviewed Nutrition Discussed  [encouraged to continue to eat healthy]  Pharmacy Interventions   Pharmacy Dicussed/Reviewed Pharmacy Topics Discussed            SDOH assessments and interventions completed:  Yes  SDOH Interventions Today    Flowsheet Row Most Recent Value  SDOH Interventions   Depression Interventions/Treatment  Counseling  [patient states she does not want to take medication. expresses she would like ot coninue talk therapy as well as attend barrry joyce support group twice a month,  update PCP]     Care Coordination Interventions:  Yes, provided   Follow up plan: Follow up call scheduled for 04/24/22    Encounter Outcome:  Pt. Visit Completed   Thea Silversmith, RN, MSN, BSN, [place] Coordinator [phone]

## 2022-03-25 ENCOUNTER — Other Ambulatory Visit (HOSPITAL_BASED_OUTPATIENT_CLINIC_OR_DEPARTMENT_OTHER): Payer: Self-pay

## 2022-03-25 MED ORDER — TRAMADOL HCL 50 MG PO TABS
50.0000 mg | ORAL_TABLET | Freq: Four times a day (QID) | ORAL | 0 refills | Status: DC | PRN
  Filled 2022-03-25: qty 10, 3d supply, fill #0

## 2022-03-27 ENCOUNTER — Emergency Department (HOSPITAL_BASED_OUTPATIENT_CLINIC_OR_DEPARTMENT_OTHER): Payer: 59 | Admitting: Radiology

## 2022-03-27 ENCOUNTER — Encounter (HOSPITAL_BASED_OUTPATIENT_CLINIC_OR_DEPARTMENT_OTHER): Payer: Self-pay

## 2022-03-27 ENCOUNTER — Emergency Department (HOSPITAL_BASED_OUTPATIENT_CLINIC_OR_DEPARTMENT_OTHER): Payer: 59

## 2022-03-27 ENCOUNTER — Other Ambulatory Visit: Payer: Self-pay

## 2022-03-27 DIAGNOSIS — R600 Localized edema: Secondary | ICD-10-CM | POA: Diagnosis not present

## 2022-03-27 DIAGNOSIS — I10 Essential (primary) hypertension: Secondary | ICD-10-CM | POA: Insufficient documentation

## 2022-03-27 DIAGNOSIS — Z8616 Personal history of COVID-19: Secondary | ICD-10-CM | POA: Insufficient documentation

## 2022-03-27 DIAGNOSIS — Z79899 Other long term (current) drug therapy: Secondary | ICD-10-CM | POA: Diagnosis not present

## 2022-03-27 DIAGNOSIS — E119 Type 2 diabetes mellitus without complications: Secondary | ICD-10-CM | POA: Diagnosis not present

## 2022-03-27 DIAGNOSIS — Z8543 Personal history of malignant neoplasm of ovary: Secondary | ICD-10-CM | POA: Diagnosis not present

## 2022-03-27 DIAGNOSIS — R079 Chest pain, unspecified: Secondary | ICD-10-CM | POA: Diagnosis not present

## 2022-03-27 LAB — CBC WITH DIFFERENTIAL/PLATELET
Abs Immature Granulocytes: 0.03 10*3/uL (ref 0.00–0.07)
Basophils Absolute: 0 10*3/uL (ref 0.0–0.1)
Basophils Relative: 1 %
Eosinophils Absolute: 0.2 10*3/uL (ref 0.0–0.5)
Eosinophils Relative: 2 %
HCT: 36.2 % (ref 36.0–46.0)
Hemoglobin: 11.7 g/dL — ABNORMAL LOW (ref 12.0–15.0)
Immature Granulocytes: 0 %
Lymphocytes Relative: 21 %
Lymphs Abs: 1.7 10*3/uL (ref 0.7–4.0)
MCH: 27.7 pg (ref 26.0–34.0)
MCHC: 32.3 g/dL (ref 30.0–36.0)
MCV: 85.6 fL (ref 80.0–100.0)
Monocytes Absolute: 0.6 10*3/uL (ref 0.1–1.0)
Monocytes Relative: 7 %
Neutro Abs: 5.6 10*3/uL (ref 1.7–7.7)
Neutrophils Relative %: 69 %
Platelets: 215 10*3/uL (ref 150–400)
RBC: 4.23 MIL/uL (ref 3.87–5.11)
RDW: 16.9 % — ABNORMAL HIGH (ref 11.5–15.5)
WBC: 8.1 10*3/uL (ref 4.0–10.5)
nRBC: 0 % (ref 0.0–0.2)

## 2022-03-27 LAB — BASIC METABOLIC PANEL
Anion gap: 10 (ref 5–15)
BUN: 27 mg/dL — ABNORMAL HIGH (ref 8–23)
CO2: 21 mmol/L — ABNORMAL LOW (ref 22–32)
Calcium: 10.6 mg/dL — ABNORMAL HIGH (ref 8.9–10.3)
Chloride: 106 mmol/L (ref 98–111)
Creatinine, Ser: 1.39 mg/dL — ABNORMAL HIGH (ref 0.44–1.00)
GFR, Estimated: 39 mL/min — ABNORMAL LOW (ref >60)
Glucose, Bld: 128 mg/dL — ABNORMAL HIGH (ref 70–99)
Potassium: 4.4 mmol/L (ref 3.5–5.1)
Sodium: 137 mmol/L (ref 135–145)

## 2022-03-27 LAB — D-DIMER, QUANTITATIVE: D-Dimer, Quant: 0.71 ug/mL-FEU — ABNORMAL HIGH (ref 0.00–0.50)

## 2022-03-27 LAB — TROPONIN I (HIGH SENSITIVITY): Troponin I (High Sensitivity): 3 ng/L (ref <18)

## 2022-03-27 NOTE — ED Triage Notes (Addendum)
Patient here POV from Home.  Endorses Right Foot/Ankle Swelling that began yesterday. Became somewhat better and then worsened today again. States her Right Leg is swollen as well. Notes her Left Leg is typically always Swelling Intermittently since September.   Also endorses Intermittent Chest Discomfort for approximately 1 week. No SOB.  Currently under Chemotherapy for Ovarian Cancer. No Fevers.   NAD Noted during Triage. A&Ox4. GCS 15. Ambulatory.

## 2022-03-28 ENCOUNTER — Encounter: Payer: Self-pay | Admitting: Family Medicine

## 2022-03-28 ENCOUNTER — Ambulatory Visit (INDEPENDENT_AMBULATORY_CARE_PROVIDER_SITE_OTHER): Payer: 59 | Admitting: Family Medicine

## 2022-03-28 ENCOUNTER — Emergency Department (HOSPITAL_BASED_OUTPATIENT_CLINIC_OR_DEPARTMENT_OTHER)
Admission: EM | Admit: 2022-03-28 | Discharge: 2022-03-28 | Disposition: A | Discharge status: Home / Self Care | Payer: 59 | Attending: Emergency Medicine | Admitting: Emergency Medicine

## 2022-03-28 VITALS — BP 152/72 | HR 88 | Temp 97.8°F | Ht 63.0 in | Wt 192.0 lb

## 2022-03-28 DIAGNOSIS — R600 Localized edema: Secondary | ICD-10-CM

## 2022-03-28 DIAGNOSIS — I10 Essential (primary) hypertension: Secondary | ICD-10-CM

## 2022-03-28 DIAGNOSIS — R0602 Shortness of breath: Secondary | ICD-10-CM | POA: Diagnosis not present

## 2022-03-28 DIAGNOSIS — E119 Type 2 diabetes mellitus without complications: Secondary | ICD-10-CM

## 2022-03-28 LAB — TROPONIN I (HIGH SENSITIVITY): Troponin I (High Sensitivity): 7 ng/L (ref <18)

## 2022-03-28 NOTE — Patient Instructions (Signed)
Elevate your legs when sitting.   Eat a low sodium diet (see attachment)  Wear compression stockings.   Walk every hour.   Follow up next week with Dr. Jenny Reichmann.    Edema  Edema is when you have too much fluid in your body or under your skin. Edema may make your legs, feet, and ankles swell. Swelling often happens in looser tissues, such as around your eyes. This is a common condition. It gets more common as you get older. There are many possible causes of edema. These include: Eating too much salt (sodium). Being on your feet or sitting for a long time. Certain medical conditions, such as: Pregnancy. Heart failure. Liver disease. Kidney disease. Cancer. Hot weather may make edema worse. Edema is usually painless. Your skin may look swollen or shiny. Follow these instructions at home: Medicines Take over-the-counter and prescription medicines only as told by your doctor. Your doctor may prescribe a medicine to help your body get rid of extra water (diuretic). Take this medicine if you are told to take it. Eating and drinking Eat a low-salt (low-sodium) diet as told by your doctor. Sometimes, eating less salt may reduce swelling. Depending on the cause of your swelling, you may need to limit how much fluid you drink (fluid restriction). General instructions Raise the injured area above the level of your heart while you are sitting or lying down. Do not sit still or stand for a long time. Do not wear tight clothes. Do not wear garters on your upper legs. Exercise your legs. This can help the swelling go down. Wear compression stockings as told by your doctor. It is important that these are the right size. These should be prescribed by your doctor to prevent possible injuries. If elastic bandages or wraps are recommended, use them as told by your doctor. Contact a doctor if: Treatment is not working. You have heart, liver, or kidney disease and have symptoms of edema. You have  sudden and unexplained weight gain. Get help right away if: You have shortness of breath or chest pain. You cannot breathe when you lie down. You have pain, redness, or warmth in the swollen areas. You have heart, liver, or kidney disease and get edema all of a sudden. You have a fever and your symptoms get worse all of a sudden. These symptoms may be an emergency. Get help right away. Call 911. Do not wait to see if the symptoms will go away. Do not drive yourself to the hospital. Summary Edema is when you have too much fluid in your body or under your skin. Edema may make your legs, feet, and ankles swell. Swelling often happens in looser tissues, such as around your eyes. Raise the injured area above the level of your heart while you are sitting or lying down. Follow your doctor's instructions about diet and how much fluid you can drink. This information is not intended to replace advice given to you by your health care provider. Make sure you discuss any questions you have with your health care provider. Document Revised: 09/03/2020 Document Reviewed: 09/03/2020 Elsevier Patient Education  [place].

## 2022-03-28 NOTE — ED Provider Notes (Signed)
[facility] Provider Note: Laura Spurling, MD, FACEP  CSN: [id number] MRN: [id number] ARRIVAL: 03/27/22 at 2211 ROOM: DB011/DB011   CHIEF COMPLAINT  Leg Swelling   HISTORY OF PRESENT ILLNESS  03/28/22 2:05 AM Laura [name] is a 77 y.o. female currently undergoing chemotherapy for ovarian cancer.  She has had intermittent edema of the left lower extremity since September of last year.  She has had unremarkable Doppler ultrasounds at that leg.  She is here with edema of the right leg which began yesterday.  There is not significant pain associated.  She has also been having what she describes as heartburn off and on for the past week.  This is relieved with Pepcid AC.  She is having no chest discomfort now.   Past Medical History:  Diagnosis Date   Anemia    Anxiety    Arthritis    back of neck, bones spurs on neck   Cancer (HCC)    ovarian cancer   Cataract both eyes    Cervical disc disease    Diabetes mellitus Type 2    Family history of adverse reaction to anesthesia    sister slow to awaken   Family history of thyroid cancer    GERD (gastroesophageal reflux disease)    Heart murmur    History of COVID-19 06/2020   pain in side took paxlovid x 5 days all symptoms resolved   History of radiation therapy    abdominal lymph nodes 01/21/2021-02/01/2021  Dr Gery Pray   Hydronephrosis 08/24/2020   Right Kidney (stable per CT)   Hyperlipidemia    Hypertension    IBS (irritable bowel syndrome)    Left shoulder frozen with limited rom    Mucoid cyst of joint    right thumb   Neuropathy 03/12/2021   hands/fingers both hands numb and tingle @ times   Port-A-Cath in place 07/21/2019   Reflux    Sleep apnea 03/12/2021   has not used cpap in 3 years   Vertigo 12/2010   none since treated at [facility]   Wears glasses for reading    Wears partial dentures upper     Past Surgical History:  Procedure Laterality Date   BLADDER SURGERY     x 3 at [facility]   BREAST EXCISIONAL  BIOPSY Bilateral    BREAST SURGERY     fibroid cyst removed ? over 10 yrs ago at [facility] per pt on 03-12-2021   CHOLECYSTECTOMY     yrs ago   COLONOSCOPY  07/02/2020   and 09-19-2016   CYSTOSCOPY W/ URETERAL STENT PLACEMENT Right 03/13/2021   Procedure: CYSTOSCOPY WITH RETROGRADE PYELOGRAM/URETERAL STENT PLACEMENT;  Surgeon: Robley Fries, MD;  Location: [facility];  Service: Urology;  Laterality: Right;  30 MINS   CYSTOSCOPY W/ URETERAL STENT PLACEMENT Right 10/01/2021   Procedure: CYSTOSCOPY WITH RETROGRADE PYELOGRAM/URETERAL STENT REPLACEMENT;  Surgeon: Robley Fries, MD;  Location: [place];  Service: Urology;  Laterality: Right;   CYSTOSCOPY W/ URETERAL STENT PLACEMENT Bilateral 03/18/2022   Procedure: CYSTOSCOPY WITH BILATERAL RETROGRADE PYELOGRAM/RIGHT URETERAL STENT EXCHANGE;  Surgeon: Robley Fries, MD;  Location: [facility];  Service: Urology;  Laterality: Bilateral;  1HR   fibroids removed     breast (both breasts) age 44 and total of 3 surgeries   history of chemotherapy     6 rounds june 2021   IR IMAGING GUIDED PORT INSERTION  07/26/2019   right   MASS EXCISION  Right 06/26/2016   Procedure: EXCISION MUCOID TUMOR RIGHT THUMB, IP RIGHT THUMB;  Surgeon: Daryll Brod, MD;  Location: [place];  Service: Orthopedics;  Laterality: Right;   ROBOTIC ASSISTED BILATERAL SALPINGO OOPHERECTOMY N/A 01/03/2020   Procedure: XI ROBOTIC ASSISTED BILATERAL SALPINGO OOPHORECTOMY, RADICAL TUMOR DEBULKING;  Surgeon: Everitt Amber, MD;  Location: [facility];  Service: Gynecology;  Laterality: N/A;   ROBOTIC PELVIC AND PARA-AORTIC LYMPH NODE DISSECTION N/A 01/03/2020   Procedure: XI ROBOTIC PARA-AORTIC LYMPHADENECTOMY;  Surgeon: Everitt Amber, MD;  Location: [facility];  Service: Gynecology;  Laterality: N/A;   VAGINAL HYSTERECTOMY     age late 54's    Family History  Problem Relation Age of Onset   Hypertension Mother    Diabetes Father    Hypertension  Father    Diabetes Sister    Breast cancer Sister        stage 0   Hypertension Sister    Stroke Sister    Diabetes Maternal Aunt    Thyroid cancer Daughter 78   Hypertension Other    Colon cancer Neg Hx    Esophageal cancer Neg Hx    Stomach cancer Neg Hx    Rectal cancer Neg Hx    Endometrial cancer Neg Hx    Ovarian cancer Neg Hx     Social History   Tobacco Use   Smoking status: Never   Smokeless tobacco: Never  Vaping Use   Vaping Use: Never used  Substance Use Topics   Alcohol use: No    Alcohol/week: 0.0 standard drinks of alcohol   Drug use: No    Prior to Admission medications   Medication Sig Start Date End Date Taking? Authorizing Provider  acetaminophen (TYLENOL) 500 MG tablet Take 1 tablet (500 mg total) by mouth every 6 (six) hours as needed. Patient taking differently: Take 500 mg by mouth every 6 (six) hours as needed for mild pain. 05/16/20   Jessy Oto, MD  amLODipine (NORVASC) 5 MG tablet TAKE 1 TABLET (5 MG TOTAL) BY MOUTH DAILY. 11/07/21   Biagio Borg, MD  Bismuth Subsalicylate (PEPTO-BISMOL MAX STRENGTH PO) Take 10 mLs by mouth every 8 (eight) hours as needed (IBS symptoms). 06/13/21   [provider]  Blood Glucose Monitoring Suppl (ONE TOUCH ULTRA 2) w/Device KIT Use as directed 04/06/15   Biagio Borg, MD  Cholecalciferol (VITAMIN D3) 50 MCG (2000 UT) TABS Take 2,000 Units by mouth daily. Patient not taking: Reported on 03/24/2022    [provider]  DM-APAP-CPM (CORICIDIN HBP) 10-325-2 MG TABS Take 1 tablet by mouth daily as needed (cold symptoms, cough).    [provider]  Famotidine (PEPCID AC PO) Take 1 tablet by mouth daily as needed (indigestion).    [provider]  fluconazole (DIFLUCAN) 150 MG tablet Take 1 tablet (150 mg total) by mouth daily. Patient not taking: Reported on 03/06/2022 02/22/22     glimepiride (AMARYL) 2 MG tablet Take 0.5 tablets (1 mg total) by mouth daily before breakfast. 1/2 of 2 mg  tab 07/08/21   Biagio Borg, MD  glucose blood [facility] ULTRA) test strip USE TO CHECK BLOOD SUGARS TWO TIMES DAILY 03/22/21   Biagio Borg, MD  Lancets [facility] ULTRASOFT) lancets 1 each by Other route as needed for other. Use as instructed 03/20/21   Biagio Borg, MD  loratadine (CLARITIN) 10 MG tablet Take 10 mg by mouth daily.    [provider]  LORazepam (ATIVAN)  0.5 MG tablet Take 1 tablet (0.5 mg total) by mouth 2 (two) times daily as needed for anxiety. 05/21/21   Binnie Rail, MD  Misc Natural Products (IMMUNE FORMULA PO) Take 3 tablets by mouth daily. Airborne Immune Support    [provider]  solifenacin (VESICARE) 5 MG tablet Take 1 tablet (5 mg total) by mouth daily. 02/14/22   Biagio Borg, MD  traMADol (ULTRAM) 50 MG tablet Take 1 tablet by mouth every 6 hours as needed for severe pain Patient taking differently: Take 50 mg by mouth every 6 (six) hours as needed for moderate pain. 10/25/21     traMADol (ULTRAM) 50 MG tablet Take 1 tablet (50 mg total) by mouth every 6 (six) hours as needed. 03/25/22     vitamin B-12 (CYANOCOBALAMIN) 1000 MCG tablet Take 1,000 mcg by mouth daily.  Patient not taking: Reported on 03/24/2022    [provider]  Wheat Dextrin (BENEFIBER) CHEW Chew 3 tablets by mouth daily.    [provider]    Allergies Ciprofloxacin, Hydrocodone bit-homatrop mbr, Sulfa antibiotics, Alfuzosin, Codeine, Crestor [rosuvastatin calcium], Doxycycline, Gabapentin, Keflex [cephalexin], Myrbetriq [mirabegron er], Naproxen, Statins, and Prednisone   REVIEW OF SYSTEMS  Negative except as noted here or in the History of Present Illness.   PHYSICAL EXAMINATION  Initial Vital Signs Blood pressure (!) 161/75, pulse 75, temperature 97.8 F (36.6 C), resp. rate 18, height 5\' 3"  (1.6 m), weight 83.9 kg, SpO2 100 %.  Examination General: Well-developed, well-nourished female in no acute distress; appearance consistent with age of  record HENT: normocephalic; atraumatic Eyes: Normal appearance Neck: supple Heart: regular rate and rhythm Lungs: clear to auscultation bilaterally Abdomen: soft; nondistended; nontender; bowel sounds present Extremities: No deformity; full range of motion; +1 edema of bilateral lower extremities Neurologic: Awake, alert and oriented; motor function intact in all extremities and symmetric; no facial droop Skin: Warm and dry Psychiatric: Normal mood and affect   RESULTS  Summary of this visit's results, reviewed and interpreted by myself:   EKG Interpretation  Date/Time:  Thursday March 27 2022 22:36:22 EDT Ventricular Rate:  86 PR Interval:  158 QRS Duration: 74 QT Interval:  340 QTC Calculation: 406 R Axis:   39 Text Interpretation: Normal sinus rhythm No significant change was found Confirmed by Shanon Rosser [phone]) on 03/27/2022 10:44:58 PM       Laboratory Studies: Results for orders placed or performed during the hospital encounter of 03/28/22 (from the past 24 hour(s))  Basic metabolic panel     Status: Abnormal   Collection Time: 03/27/22 10:41 PM  Result Value Ref Range   Sodium 137 135 - 145 mmol/L   Potassium 4.4 3.5 - 5.1 mmol/L   Chloride 106 98 - 111 mmol/L   CO2 21 (L) 22 - 32 mmol/L   Glucose, Bld 128 (H) 70 - 99 mg/dL   BUN 27 (H) 8 - 23 mg/dL   Creatinine, Ser 1.39 (H) 0.44 - 1.00 mg/dL   Calcium 10.6 (H) 8.9 - 10.3 mg/dL   GFR, Estimated 39 (L) >60 mL/min   Anion gap 10 5 - 15  Troponin I (High Sensitivity)     Status: None   Collection Time: 03/27/22 10:41 PM  Result Value Ref Range   Troponin I (High Sensitivity) 3 <18 ng/L  CBC with Differential     Status: Abnormal   Collection Time: 03/27/22 10:44 PM  Result Value Ref Range   WBC 8.1 4.0 - 10.5 K/uL  RBC 4.23 3.87 - 5.11 MIL/uL   Hemoglobin 11.7 (L) 12.0 - 15.0 g/dL   HCT 36.2 36.0 - 46.0 %   MCV 85.6 80.0 - 100.0 fL   MCH 27.7 26.0 - 34.0 pg   MCHC 32.3 30.0 - 36.0 g/dL   RDW 16.9 (H)  11.5 - 15.5 %   Platelets 215 150 - 400 K/uL   nRBC 0.0 0.0 - 0.2 %   Neutrophils Relative % 69 %   Neutro Abs 5.6 1.7 - 7.7 K/uL   Lymphocytes Relative 21 %   Lymphs Abs 1.7 0.7 - 4.0 K/uL   Monocytes Relative 7 %   Monocytes Absolute 0.6 0.1 - 1.0 K/uL   Eosinophils Relative 2 %   Eosinophils Absolute 0.2 0.0 - 0.5 K/uL   Basophils Relative 1 %   Basophils Absolute 0.0 0.0 - 0.1 K/uL   Immature Granulocytes 0 %   Abs Immature Granulocytes 0.03 0.00 - 0.07 K/uL  D-dimer, quantitative     Status: Abnormal   Collection Time: 03/27/22 10:44 PM  Result Value Ref Range   D-Dimer, Quant 0.71 (H) 0.00 - 0.50 ug/mL-FEU  Troponin I (High Sensitivity)     Status: None   Collection Time: 03/28/22  1:10 AM  Result Value Ref Range   Troponin I (High Sensitivity) 7 <18 ng/L   *Note: Due to a large number of results and/or encounters for the requested time period, some results have not been displayed. A complete set of results can be found in Results Review.   Imaging Studies: US Venous Img Lower Bilateral (DVT)  Result Date: 03/28/2022 CLINICAL DATA:  Right foot and ankle edema for 2 days. Left lower extremity edema for 6-7 months. Elevated D-dimer. Shortness of breath and chest pain. Chemo for ovarian cancer. EXAM: Bilateral LOWER EXTREMITY VENOUS DOPPLER ULTRASOUND TECHNIQUE: Gray-scale sonography with compression, as well as color and duplex ultrasound, were performed to evaluate the deep venous system(s) from the level of the common femoral vein through the popliteal and proximal calf veins. COMPARISON:  Lower extremity ultrasound 12/27/2021 FINDINGS: VENOUS Normal compressibility of the common femoral, superficial femoral, and popliteal veins, as well as the visualized calf veins. Visualized portions of profunda femoral vein and great saphenous vein unremarkable. No filling defects to suggest DVT on grayscale or color Doppler imaging. Doppler waveforms show normal direction of venous flow,  normal respiratory plasticity and response to augmentation. Limited views of the contralateral common femoral vein are unremarkable. OTHER None. Limitations: none IMPRESSION: Negative. Electronically Signed   By: Placido Sou M.D.   On: 03/28/2022 00:42   DG Chest 2 View  Result Date: 03/27/2022 CLINICAL DATA:  Chest pain EXAM: CHEST - 2 VIEW COMPARISON:  Chest x-ray 08/07/2021 FINDINGS: Right chest port catheter tip projects over the SVC. The heart size and mediastinal contours are within normal limits. Both lungs are clear. The visualized skeletal structures are unremarkable. IMPRESSION: No active cardiopulmonary disease. Electronically Signed   By: Ronney Asters M.D.   On: 03/27/2022 23:06    ED COURSE and MDM  Nursing notes, initial and subsequent vitals signs, including pulse oximetry, reviewed and interpreted by myself.  Vitals:   03/27/22 2225 03/27/22 2228 03/28/22 0110  BP: (!) 187/115  (!) 161/75  Pulse: 91  75  Resp: 20  18  Temp: 97.8 F (36.6 C)    SpO2: 100%  100%  Weight:  83.9 kg   Height:  5\' 3"  (1.6 m)    Medications - No  data to display  Doppler ultrasounds are negative for deep vein thromboses.  The chest does not comfort sounds consistent with acid reflux and has been relieved with Pepcid AC.  Her EKG and troponins showed no evidence of acute ischemic changes.  PROCEDURES  Procedures   ED DIAGNOSES     ICD-10-CM   1. Bilateral lower extremity edema  R60.0          Abigaile Rossie, MD 03/28/22 [phone]
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
## 2022-03-31 ENCOUNTER — Encounter: Payer: Self-pay | Admitting: Family Medicine

## 2022-03-31 ENCOUNTER — Ambulatory Visit (INDEPENDENT_AMBULATORY_CARE_PROVIDER_SITE_OTHER): Payer: 59 | Admitting: Family Medicine

## 2022-03-31 ENCOUNTER — Other Ambulatory Visit (HOSPITAL_BASED_OUTPATIENT_CLINIC_OR_DEPARTMENT_OTHER): Payer: Self-pay

## 2022-03-31 VITALS — BP 136/66 | HR 72 | Temp 97.8°F | Resp 20 | Ht 63.0 in | Wt 192.0 lb

## 2022-03-31 DIAGNOSIS — L03011 Cellulitis of right finger: Secondary | ICD-10-CM

## 2022-03-31 MED ORDER — AMOXICILLIN-POT CLAVULANATE 875-125 MG PO TABS
1.0000 | ORAL_TABLET | Freq: Two times a day (BID) | ORAL | 0 refills | Status: AC
  Filled 2022-03-31: qty 14, 7d supply, fill #0

## 2022-03-31 NOTE — Progress Notes (Addendum)
Assessment & Plan:  1. Paronychia of right thumb Education provided on paronychia.  Encouraged Epsom salt soaks.  Started on Augmentin. - amoxicillin-clavulanate (AUGMENTIN) 875-125 MG tablet; Take 1 tablet by mouth 2 (two) times daily for 7 days.  Dispense: 14 tablet; Refill: 0   Follow up plan: Return if symptoms worsen or fail to improve.  Hendricks Limes, MSN, APRN, FNP-C  Subjective:  HPI: Laura [name] is a 77 y.o. female presenting on 03/31/2022 for thumb pain (Right - started with pain and swelling about 4-5 days ago. No known injury, did have a manicure done last week )  Patient reports right thumb pain and swelling that started 4 to 5 days ago.  It has not been draining.  Denies any injury.  She did get a manicure last week.  She has applied rubbing alcohol at home.  States it is getting worse.   ROS: Negative unless specifically indicated above in HPI.   Relevant past medical history reviewed and updated as indicated.   Allergies and medications reviewed and updated.   Current Outpatient Medications:    acetaminophen (TYLENOL) 500 MG tablet, Take 1 tablet (500 mg total) by mouth every 6 (six) hours as needed. (Patient taking differently: Take 500 mg by mouth every 6 (six) hours as needed for mild pain.), Disp: 30 tablet, Rfl: 0   amLODipine (NORVASC) 5 MG tablet, TAKE 1 TABLET (5 MG TOTAL) BY MOUTH DAILY., Disp: 90 tablet, Rfl: 3   Blood Glucose Monitoring Suppl (ONE TOUCH ULTRA 2) w/Device KIT, Use as directed, Disp: 1 each, Rfl: 0   Cholecalciferol (VITAMIN D3) 50 MCG (2000 UT) TABS, Take 2,000 Units by mouth daily., Disp: , Rfl:    Famotidine (PEPCID AC PO), Take 1 tablet by mouth daily as needed (indigestion)., Disp: , Rfl:    glimepiride (AMARYL) 2 MG tablet, Take 0.5 tablets (1 mg total) by mouth daily before breakfast. 1/2 of 2 mg tab, Disp: 90 tablet, Rfl: 3   glucose blood (ONETOUCH ULTRA) test strip, USE TO CHECK BLOOD SUGARS TWO TIMES DAILY, Disp: 100 strip, Rfl:  3   Lancets (ONETOUCH ULTRASOFT) lancets, 1 each by Other route as needed for other. Use as instructed, Disp: 100 each, Rfl: 3   loratadine (CLARITIN) 10 MG tablet, Take 10 mg by mouth daily., Disp: , Rfl:    LORazepam (ATIVAN) 0.5 MG tablet, Take 1 tablet (0.5 mg total) by mouth 2 (two) times daily as needed for anxiety., Disp: 60 tablet, Rfl: 0   Misc Natural Products (IMMUNE FORMULA PO), Take 3 tablets by mouth daily. Airborne Immune Support, Disp: , Rfl:    solifenacin (VESICARE) 5 MG tablet, Take 1 tablet (5 mg total) by mouth daily., Disp: 90 tablet, Rfl: 3   traMADol (ULTRAM) 50 MG tablet, Take 1 tablet by mouth every 6 hours as needed for severe pain (Patient taking differently: Take 50 mg by mouth every 6 (six) hours as needed for moderate pain.), Disp: 15 tablet, Rfl: 0   vitamin B-12 (CYANOCOBALAMIN) 1000 MCG tablet, Take 1,000 mcg by mouth daily., Disp: , Rfl:    Wheat Dextrin (BENEFIBER) CHEW, Chew 3 tablets by mouth daily., Disp: , Rfl:    Bismuth Subsalicylate (PEPTO-BISMOL MAX STRENGTH PO), Take 10 mLs by mouth every 8 (eight) hours as needed (IBS symptoms). (Patient not taking: Reported on 03/31/2022), Disp: , Rfl:    traMADol (ULTRAM) 50 MG tablet, Take 1 tablet (50 mg total) by mouth every 6 (six) hours as needed. (Patient  not taking: Reported on 03/31/2022), Disp: 10 tablet, Rfl: 0  Allergies  Allergen Reactions   Ciprofloxacin Swelling    Torn tendon   Hydrocodone Bit-Homatrop Mbr Other (See Comments)    Vertigo *pt strongly prefers to never take* took 3 years to recover from   Sulfa Antibiotics Hives, Itching and Swelling    Tongue swells   Alfuzosin Other (See Comments)    Weakness and fatigue   Codeine Itching   Crestor [Rosuvastatin Calcium] Other (See Comments)    Did something to memory     Doxycycline Other (See Comments)    Severe rectal Gas.   Gabapentin     disoriented   Keflex [Cephalexin] Diarrhea and Nausea And Vomiting   Myrbetriq Andree [place] Er]  Swelling    Swelling of legs and feet   Naproxen Other (See Comments)    Stomach cramps/loud gas   Statins     Muscle weakness   Prednisone Anxiety    *pt strongly prefers to never be given prednisone*     Objective:   BP 136/66   Pulse 72   Temp 97.8 F (36.6 C)   Resp 20   Ht 5\' 3"  (1.6 m)   Wt 192 lb (87.1 kg)   BMI 34.01 kg/m    Physical Exam Vitals reviewed.  Constitutional:      General: She is not in acute distress.    Appearance: Normal appearance. She is not ill-appearing, toxic-appearing or diaphoretic.  HENT:     Head: Normocephalic and atraumatic.  Eyes:     General: No scleral icterus.       Right eye: No discharge.        Left eye: No discharge.     Conjunctiva/sclera: Conjunctivae normal.  Cardiovascular:     Rate and Rhythm: Normal rate.  Pulmonary:     Effort: Pulmonary effort is normal. No respiratory distress.  Musculoskeletal:        General: Normal range of motion.     Cervical back: Normal range of motion.  Skin:    General: Skin is warm and dry.     Capillary Refill: Capillary refill takes less than 2 seconds.     Comments: Erythema and swelling around right thumb cuticle. Pus present, but not actively draining.   Neurological:     General: No focal deficit present.     Mental Status: She is alert and oriented to person, place, and time. Mental status is at baseline.  Psychiatric:        Mood and Affect: Mood normal.        Behavior: Behavior normal.        Thought Content: Thought content normal.        Judgment: Judgment normal.

## 2022-04-02 ENCOUNTER — Other Ambulatory Visit (HOSPITAL_BASED_OUTPATIENT_CLINIC_OR_DEPARTMENT_OTHER): Payer: Self-pay

## 2022-04-02 ENCOUNTER — Telehealth: Payer: Self-pay

## 2022-04-02 DIAGNOSIS — N130 Hydronephrosis with ureteropelvic junction obstruction: Secondary | ICD-10-CM | POA: Diagnosis not present

## 2022-04-02 DIAGNOSIS — R1031 Right lower quadrant pain: Secondary | ICD-10-CM | POA: Diagnosis not present

## 2022-04-02 MED ORDER — TRAMADOL HCL 50 MG PO TABS
50.0000 mg | ORAL_TABLET | Freq: Three times a day (TID) | ORAL | 0 refills | Status: DC | PRN
  Filled 2022-04-02: qty 20, 7d supply, fill #0

## 2022-04-02 NOTE — Telephone Encounter (Signed)
        Patient  visited [facility] on 3/15    Telephone encounter attempt :  1st  A HIPAA compliant voice message was left requesting a return call.  Instructed patient to call back    [place] [phone] [address], [place], [place] [address] Phone: [phone] Email: [email or web address]

## 2022-04-03 ENCOUNTER — Telehealth: Payer: Self-pay

## 2022-04-03 ENCOUNTER — Ambulatory Visit: Payer: Self-pay | Admitting: Licensed Clinical Social Worker

## 2022-04-03 NOTE — Patient Outreach (Signed)
  Care Coordination  Follow Up Visit Note   04/03/2022 Name: Laura [name] MRN: [id number] DOB: 1945/12/24  Laura [name] is a 77 y.o. year old female who sees Biagio Borg, MD for primary care. I spoke with  [name] by phone today.  What matters to the patients health and wellness today?  Managing symptoms of anxiety related to her health  Patient continues to experience symptoms of anxiety related to her health.  Notices an increase in symptoms when she has a procedure or upcoming appointments.  She is making progress with managing symptoms utilizing interventions discussed. LCSW will continue to provide support as needed.   Goals Addressed             This Visit's Progress    Care Coordination Activiites Reduce symptoms of anxiety       Activities and task to complete in order to accomplish goals.   Start / continue relaxed breathing 3 times daily Keep all upcoming appointment discussed today Continue with compliance of taking medication prescribed by Doctor Continue meeting with your Cancer support group  and community support        SDOH assessments and interventions completed:  No   Care Coordination Interventions:  Yes, provided  Interventions Today    Flowsheet Row Most Recent Value  Chronic Disease   Chronic disease during today's visit Hypertension (HTN), Diabetes  General Interventions   General Interventions Discussed/Reviewed General Interventions Reviewed  [reminded of upcoming appointments]  Doctor Visits Discussed/Reviewed Doctor Visits Discussed  [related to health concerns with leg swelling and visit to [facility] Reviewed, Anxiety, Coping Strategies  [solution focused, active listening, emotional support, Behavioral Activation]       Follow up plan: Follow up call scheduled for 05/09/22    Encounter Outcome:  Pt. Visit Completed   [name], [place] [phone]

## 2022-04-03 NOTE — Patient Instructions (Addendum)
Visit Information  Thank you for taking time to visit with me today. Please don't hesitate to contact me if I can be of assistance to you.   Following are the goals we discussed today:   Goals Addressed             This Visit's Progress    Care Coordination Activiites Reduce symptoms of anxiety       Activities and task to complete in order to accomplish goals.   Start / continue relaxed breathing 3 times daily Keep all upcoming appointment discussed today Continue with compliance of taking medication prescribed by Doctor Continue meeting with your Cancer support group  and community support         Our next appointment is by telephone on April 26th at 10:00  Please call the care guide team at [phone] if you need to cancel or reschedule your appointment.   If you are experiencing a Mental Health or [place] or need someone to talk to, please call the Suicide and Crisis Lifeline: 988 call the [place] National Suicide Prevention Lifeline: 223-458-0346 or TTY: (720)640-3178 TTY (575)657-3506) to talk to a trained counselor call 1-800-273-TALK (toll free, 24 hour hotline) go to [facility] [address] [place] [phone])   Patient verbalizes understanding of instructions and care plan provided today and agrees to view in [place]. Active MyChart status and patient understanding of how to access instructions and care plan via MyChart confirmed with patient.     [name], [place] [phone]

## 2022-04-03 NOTE — Telephone Encounter (Signed)
     Patient  visit on 3/15  at [facility]    Have you been able to follow up with your primary care physician? Yes   The patient was or was not able to obtain any needed medicine or equipment. Yes   Are there diet recommendations that you are having difficulty following? Na   Patient expresses understanding of discharge instructions and education provided has no other needs at this time. Yes      [facility] [phone] [address], [place], [place] [address] Phone: [phone] Email: [email or web address]

## 2022-04-04 ENCOUNTER — Ambulatory Visit (INDEPENDENT_AMBULATORY_CARE_PROVIDER_SITE_OTHER): Payer: 59 | Admitting: Internal Medicine

## 2022-04-04 ENCOUNTER — Other Ambulatory Visit (HOSPITAL_BASED_OUTPATIENT_CLINIC_OR_DEPARTMENT_OTHER): Payer: Self-pay

## 2022-04-04 ENCOUNTER — Encounter: Payer: Self-pay | Admitting: Internal Medicine

## 2022-04-04 VITALS — BP 126/72 | HR 82 | Temp 99.0°F | Ht 63.0 in | Wt 193.0 lb

## 2022-04-04 DIAGNOSIS — F419 Anxiety disorder, unspecified: Secondary | ICD-10-CM

## 2022-04-04 DIAGNOSIS — E119 Type 2 diabetes mellitus without complications: Secondary | ICD-10-CM

## 2022-04-04 DIAGNOSIS — M7989 Other specified soft tissue disorders: Secondary | ICD-10-CM

## 2022-04-04 DIAGNOSIS — I10 Essential (primary) hypertension: Secondary | ICD-10-CM

## 2022-04-04 MED ORDER — LORAZEPAM 0.5 MG PO TABS
0.5000 mg | ORAL_TABLET | Freq: Two times a day (BID) | ORAL | 1 refills | Status: DC | PRN
  Filled 2022-04-04: qty 60, 30d supply, fill #0

## 2022-04-04 NOTE — Progress Notes (Unsigned)
Patient ID: Laura [name], female   DOB: 1945-04-15, 77 y.o.   MRN: [id number]        Chief Complaint: follow up recent new onset RLE swelling - new after recent months of LLE swelling of uncertain etiology, dm, htn.        HPI:  Laura [name] is a 77 y.o. female here with c/o seen at [facility] seen per vascular there, eval there negative for DVT PE.  Seen NP here mar 15 with recommendation also for compression stockings, low salt diet, leg elevation.  Pt interested in mild fluid pill if ok today.  Pt denies chest pain, increased sob or doe, wheezing, orthopnea, PND, palpitations, dizziness or syncope. Saw urology yesterday as well, pt to take myrbetrix but she declines as she had perceived side effect a few yrs ago.  Now on vesicare 5 mg qd.   Pt has hx of retroperitoneal mass and ovary ca, with ? Abnormal left pelvis on last CT.  Pt has hx of urethra stent due to local compressive lymphadenopathy.   Next CT abd pelvis due May 13 2022 with oncology.  Has been taking tramadol prn for the past wk with right > left pelvic pain, related to urethral issue per pt per urology.  Pt had pain with urination recently, with antibx course early mar 2024 per urology, and with recurrence pain has been on augmentin for last 3 days for total 7 days planned.  .      Wt Readings from Last 3 Encounters:  04/04/22 193 lb (87.5 kg)  03/31/22 192 lb (87.1 kg)  03/28/22 192 lb (87.1 kg)   BP Readings from Last 3 Encounters:  04/04/22 126/72  03/31/22 136/66  03/28/22 (!) 152/72         Past Medical History:  Diagnosis Date   Anemia    Anxiety    Arthritis    back of neck, bones spurs on neck   Cancer (HCC)    ovarian cancer   Cataract both eyes    Cervical disc disease    Diabetes mellitus Type 2    Family history of adverse reaction to anesthesia    sister slow to awaken   Family history of thyroid cancer    GERD (gastroesophageal reflux disease)    Heart murmur    History of COVID-19  06/2020   pain in side took paxlovid x 5 days all symptoms resolved   History of radiation therapy    abdominal lymph nodes 01/21/2021-02/01/2021  Dr Gery Pray   Hydronephrosis 08/24/2020   Right Kidney (stable per CT)   Hyperlipidemia    Hypertension    IBS (irritable bowel syndrome)    Left shoulder frozen with limited rom    Mucoid cyst of joint    right thumb   Neuropathy 03/12/2021   hands/fingers both hands numb and tingle @ times   Port-A-Cath in place 07/21/2019   Reflux    Sleep apnea 03/12/2021   has not used cpap in 3 years   Vertigo 12/2010   none since treated at [facility]   Wears glasses for reading    Wears partial dentures upper    Past Surgical History:  Procedure Laterality Date   BLADDER SURGERY     [facility] at [facility]   BREAST EXCISIONAL BIOPSY Bilateral    BREAST SURGERY     fibroid cyst removed ? over 10 yrs ago at [facility] per pt on 03-12-2021   CHOLECYSTECTOMY  yrs ago   COLONOSCOPY  07/02/2020   and 09-19-2016   CYSTOSCOPY W/ URETERAL STENT PLACEMENT Right 03/13/2021   Procedure: CYSTOSCOPY WITH RETROGRADE PYELOGRAM/URETERAL STENT PLACEMENT;  Surgeon: Robley Fries, MD;  Location: [facility];  Service: Urology;  Laterality: Right;  30 MINS   CYSTOSCOPY W/ URETERAL STENT PLACEMENT Right 10/01/2021   Procedure: CYSTOSCOPY WITH RETROGRADE PYELOGRAM/URETERAL STENT REPLACEMENT;  Surgeon: Robley Fries, MD;  Location: [facility];  Service: Urology;  Laterality: Right;   CYSTOSCOPY W/ URETERAL STENT PLACEMENT Bilateral 03/18/2022   Procedure: CYSTOSCOPY WITH BILATERAL RETROGRADE PYELOGRAM/RIGHT URETERAL STENT EXCHANGE;  Surgeon: Robley Fries, MD;  Location: [facility];  Service: Urology;  Laterality: Bilateral;  1HR   fibroids removed     breast (both breasts) age 61 and total of 3 surgeries   history of chemotherapy     6 rounds june 2021   IR IMAGING GUIDED PORT INSERTION  07/26/2019   right   MASS EXCISION Right 06/26/2016    Procedure: EXCISION MUCOID TUMOR RIGHT THUMB, IP RIGHT THUMB;  Surgeon: Daryll Brod, MD;  Location: [facility];  Service: Orthopedics;  Laterality: Right;   ROBOTIC ASSISTED BILATERAL SALPINGO OOPHERECTOMY N/A 01/03/2020   Procedure: XI ROBOTIC ASSISTED BILATERAL SALPINGO OOPHORECTOMY, RADICAL TUMOR DEBULKING;  Surgeon: Everitt Amber, MD;  Location: [facility];  Service: Gynecology;  Laterality: N/A;   ROBOTIC PELVIC AND PARA-AORTIC LYMPH NODE DISSECTION N/A 01/03/2020   Procedure: XI ROBOTIC PARA-AORTIC LYMPHADENECTOMY;  Surgeon: Everitt Amber, MD;  Location: [facility];  Service: Gynecology;  Laterality: N/A;   VAGINAL HYSTERECTOMY     age late 54's    reports that she has never smoked. She has never used smokeless tobacco. She reports that she does not drink alcohol and does not use drugs. family history includes Breast cancer in her sister; Diabetes in her father, maternal aunt, and sister; Hypertension in her father, mother, sister, and another family member; Stroke in her sister; Thyroid cancer (age of onset: 64) in her daughter. Allergies  Allergen Reactions   Ciprofloxacin Swelling    Torn tendon   Hydrocodone Bit-Homatrop Mbr Other (See Comments)    Vertigo *pt strongly prefers to never take* took 3 years to recover from   Sulfa Antibiotics Hives, Itching and Swelling    Tongue swells   Alfuzosin Other (See Comments)    Weakness and fatigue   Codeine Itching   Crestor [Rosuvastatin Calcium] Other (See Comments)    Did something to memory     Doxycycline Other (See Comments)    Severe rectal Gas.   Gabapentin     disoriented   Keflex [Cephalexin] Diarrhea and Nausea And Vomiting   Myrbetriq [facility]] Swelling    Swelling of legs and feet   Naproxen Other (See Comments)    Stomach cramps/loud gas   Statins     Muscle weakness   Prednisone Anxiety    *pt strongly prefers to never be given prednisone*    Current Outpatient Medications on File Prior to Visit   Medication Sig Dispense Refill   acetaminophen (TYLENOL) 500 MG tablet Take 1 tablet (500 mg total) by mouth every 6 (six) hours as needed. (Patient taking differently: Take 500 mg by mouth every 6 (six) hours as needed for mild pain.) 30 tablet 0   amLODipine (NORVASC) 5 MG tablet TAKE 1 TABLET (5 MG TOTAL) BY MOUTH DAILY. 90 tablet 3   amoxicillin-clavulanate (AUGMENTIN) 875-125 MG tablet Take 1 tablet by mouth  2 (two) times daily for 7 days. 14 tablet 0   Bismuth Subsalicylate (PEPTO-BISMOL MAX STRENGTH PO) Take 10 mLs by mouth every 8 (eight) hours as needed (IBS symptoms).     Blood Glucose Monitoring Suppl (ONE TOUCH ULTRA 2) w/Device KIT Use as directed 1 each 0   Cholecalciferol (VITAMIN D3) 50 MCG (2000 UT) TABS Take 2,000 Units by mouth daily.     Famotidine (PEPCID AC PO) Take 1 tablet by mouth daily as needed (indigestion).     glimepiride (AMARYL) 2 MG tablet Take 0.5 tablets (1 mg total) by mouth daily before breakfast. 1/2 of 2 mg tab 90 tablet 3   glucose blood (ONETOUCH ULTRA) test strip USE TO CHECK BLOOD SUGARS TWO TIMES DAILY 100 strip 3   Lancets (ONETOUCH ULTRASOFT) lancets 1 each by Other route as needed for other. Use as instructed 100 each 3   loratadine (CLARITIN) 10 MG tablet Take 10 mg by mouth daily.     Misc Natural Products (IMMUNE FORMULA PO) Take 3 tablets by mouth daily. Airborne Immune Support     solifenacin (VESICARE) 5 MG tablet Take 1 tablet (5 mg total) by mouth daily. 90 tablet 3   traMADol (ULTRAM) 50 MG tablet Take 1 tablet by mouth every 6 hours as needed for severe pain (Patient taking differently: Take 50 mg by mouth every 6 (six) hours as needed for moderate pain.) 15 tablet 0   traMADol (ULTRAM) 50 MG tablet Take 1 tablet (50 mg total) by mouth every 8 (eight) hours as needed for severe abdominal pain. 20 tablet 0   vitamin B-12 (CYANOCOBALAMIN) 1000 MCG tablet Take 1,000 mcg by mouth daily.     Wheat Dextrin (BENEFIBER) CHEW Chew 3 tablets by mouth  daily.     No current facility-administered medications on file prior to visit.        ROS:  All others reviewed and negative except for right thumb redness of the distal joint area, also ongoing anxiety    Objective        PE:  BP 126/72 (BP Location: Right Arm, Patient Position: Sitting, Cuff Size: Normal)   Pulse 82   Temp 99 F (37.2 C) (Oral)   Ht 5\' 3"  (1.6 m)   Wt 193 lb (87.5 kg)   SpO2 99%   BMI 34.19 kg/m                 Constitutional: Pt appears in NAD               HENT: Head: NCAT.                Right Ear: External ear normal.                 Left Ear: External ear normal.                Eyes: . Pupils are equal, round, and reactive to light. Conjunctivae and EOM are normal               Nose: without d/c or deformity               Neck: Neck supple. Gross normal ROM               Cardiovascular: Normal rate and regular rhythm.                 Pulmonary/Chest: Effort normal and breath sounds without rales or wheezing.  Abd:  Soft, NT, ND, + BS, no organomegaly               Neurological: Pt is alert. At baseline orientation, motor grossly intact               Skin: Skin is warm. No rashes, no other new lesions, LE edema - 1+ LLE and trace RLE               Psychiatric: Pt behavior is normal without agitation   Micro: none  Cardiac tracings I have personally interpreted today:  none  Pertinent Radiological findings (summarize): none   Lab Results  Component Value Date   WBC 8.1 03/27/2022   HGB 11.7 (L) 03/27/2022   HCT 36.2 03/27/2022   PLT 215 03/27/2022   GLUCOSE 128 (H) 03/27/2022   CHOL 237 (H) 03/06/2021   TRIG 169.0 (H) 03/06/2021   HDL 49.20 03/06/2021   LDLDIRECT 172.0 02/28/2020   LDLCALC 154 (H) 03/06/2021   ALT 11 02/04/2022   AST 20 02/04/2022   NA 137 03/27/2022   K 4.4 03/27/2022   CL 106 03/27/2022   CREATININE 1.39 (H) 03/27/2022   BUN 27 (H) 03/27/2022   CO2 21 (L) 03/27/2022   TSH 1.38 03/06/2021   INR 1.0  06/06/2019   HGBA1C 5.7 (A) 02/14/2022   MICROALBUR 2.1 (H) 03/06/2021   Assessment/Plan:  Laura [name] is a 77 y.o. Black or African American [2] female with  has a past medical history of Anemia, Anxiety, Arthritis, Cancer ([place]), Cataract both eyes, Cervical disc disease, Diabetes mellitus Type 2, Family history of adverse reaction to anesthesia, Family history of thyroid cancer, GERD (gastroesophageal reflux disease), Heart murmur, History of COVID-19 (06/2020), History of radiation therapy, Hydronephrosis (08/24/2020), Hyperlipidemia, Hypertension, IBS (irritable bowel syndrome), Left shoulder frozen with limited rom, Mucoid cyst of joint, Neuropathy (03/12/2021), Port-A-Cath in place (07/21/2019), Reflux, Sleep apnea (03/12/2021), Vertigo (12/2010), Wears glasses for reading, and Wears partial dentures upper.  Swelling of lower leg Chronic on LLE prior to jan 2024, now mild to the RLE, with CT jan 2024 with some suggestion of very early nodularity to the left pelvis area.  I personally believe she likely has very early recurrent malignancy involvement of the left lymph system, now starting to the right as well.  Pt plans to f'/u with oncology and CT.  Declines fluid pill for now.    Diabetes mellitus type 2, noninsulin dependent ([place]) Lab Results  Component Value Date   HGBA1C 5.7 (A) 02/14/2022   Stable, pt to continue current medical treatment glimeparide 1 mg qd   Hypertension BP Readings from Last 3 Encounters:  04/04/22 126/72  03/31/22 136/66  03/28/22 (!) 152/72   Stable, pt to continue medical treatment norvasc 5 mg qd   Anxiety With increased anxiety recently, for restart lorazepam prn asd  Followup: Return in about 3 months (around 07/05/2022).  Cathlean Cower, MD 04/06/2022 7:36 PM [facility]

## 2022-04-04 NOTE — Assessment & Plan Note (Signed)
Chronic on LLE prior to jan 2024, now mild to the RLE, with CT jan 2024 with some suggestion of very early nodularity to the left pelvis area.  I personally believe she likely has very early recurrent malignancy involvement of the left lymph system, now starting to the right as well.  Pt plans to f'/u with oncology and CT.  Declines fluid pill for now.

## 2022-04-04 NOTE — Patient Instructions (Signed)
Please take all new medication as prescribed - the lorazepam as needed  Please continue all other medications as before, and refills have been done if requested.  Please have the pharmacy call with any other refills you may need.  Please keep your appointments with your specialists as you may have planned - oncology with follow up CT scan  Please make an Appointment to return in 3 months, or sooner if needed

## 2022-04-06 NOTE — Assessment & Plan Note (Signed)
Lab Results  Component Value Date   HGBA1C 5.7 (A) 02/14/2022   Stable, pt to continue current medical treatment glimeparide 1 mg qd

## 2022-04-06 NOTE — Assessment & Plan Note (Signed)
With increased anxiety recently, for restart lorazepam prn asd

## 2022-04-06 NOTE — Assessment & Plan Note (Signed)
BP Readings from Last 3 Encounters:  04/04/22 126/72  03/31/22 136/66  03/28/22 (!) 152/72   Stable, pt to continue medical treatment norvasc 5 mg qd

## 2022-04-08 NOTE — Progress Notes (Signed)
Patient called this morning reporting increased right lower abdomen pain that is worsening as well as BLE swelling. Patient reports that her PCP and urologist encouraged her to follow-up with [facility] regarding these issues. Discussed with Dr. Delton Coombes who recommends moving patient's scheduled CT scan to an earlier date than April 30th. CT scan and follow-up with Dr. Delton Coombes rescheduled and patient aware.

## 2022-04-09 ENCOUNTER — Telehealth: Payer: Self-pay | Admitting: Internal Medicine

## 2022-04-09 NOTE — Telephone Encounter (Signed)
Patient has been scheduled for 04/15/22.

## 2022-04-09 NOTE — Telephone Encounter (Signed)
Pt will need follow-up appt w/ her PCP since no better and other issues.Marland KitchenJohny Chess

## 2022-04-09 NOTE — Telephone Encounter (Signed)
Patient called and said she was given an antibiotic by Hendricks Limes. She said her symptoms improved, but are not completely gone. She would like to know what can be done.  She also said the prescribed tramadol helps with her pain a little but not enough and it takes a long time to work. She wants a call back to let her know what can be done.  Patient said she was on a medication to help her sleep, but she doesn't know the name. She would like a call back to let her know.  Best call back is [phone].

## 2022-04-10 ENCOUNTER — Ambulatory Visit (HOSPITAL_COMMUNITY)
Admission: RE | Admit: 2022-04-10 | Discharge: 2022-04-10 | Disposition: A | Discharge status: Home / Self Care | Payer: 59 | Source: Ambulatory Visit | Attending: Hematology | Admitting: Hematology

## 2022-04-10 ENCOUNTER — Inpatient Hospital Stay: Payer: 59 | Attending: Hematology

## 2022-04-10 DIAGNOSIS — I2510 Atherosclerotic heart disease of native coronary artery without angina pectoris: Secondary | ICD-10-CM | POA: Insufficient documentation

## 2022-04-10 DIAGNOSIS — C569 Malignant neoplasm of unspecified ovary: Secondary | ICD-10-CM | POA: Insufficient documentation

## 2022-04-10 DIAGNOSIS — I700 Atherosclerosis of aorta: Secondary | ICD-10-CM | POA: Diagnosis not present

## 2022-04-10 DIAGNOSIS — N1330 Unspecified hydronephrosis: Secondary | ICD-10-CM | POA: Diagnosis not present

## 2022-04-10 DIAGNOSIS — N134 Hydroureter: Secondary | ICD-10-CM | POA: Diagnosis not present

## 2022-04-10 LAB — CBC WITH DIFFERENTIAL/PLATELET
Abs Immature Granulocytes: 0.02 10*3/uL (ref 0.00–0.07)
Basophils Absolute: 0 10*3/uL (ref 0.0–0.1)
Basophils Relative: 0 %
Eosinophils Absolute: 0.1 10*3/uL (ref 0.0–0.5)
Eosinophils Relative: 1 %
HCT: 36.9 % (ref 36.0–46.0)
Hemoglobin: 11.7 g/dL — ABNORMAL LOW (ref 12.0–15.0)
Immature Granulocytes: 0 %
Lymphocytes Relative: 26 %
Lymphs Abs: 1.7 10*3/uL (ref 0.7–4.0)
MCH: 28.2 pg (ref 26.0–34.0)
MCHC: 31.7 g/dL (ref 30.0–36.0)
MCV: 88.9 fL (ref 80.0–100.0)
Monocytes Absolute: 0.5 10*3/uL (ref 0.1–1.0)
Monocytes Relative: 7 %
Neutro Abs: 4.4 10*3/uL (ref 1.7–7.7)
Neutrophils Relative %: 66 %
Platelets: 214 10*3/uL (ref 150–400)
RBC: 4.15 MIL/uL (ref 3.87–5.11)
RDW: 16.3 % — ABNORMAL HIGH (ref 11.5–15.5)
WBC: 6.8 10*3/uL (ref 4.0–10.5)
nRBC: 0 % (ref 0.0–0.2)

## 2022-04-10 LAB — COMPREHENSIVE METABOLIC PANEL
ALT: 11 U/L (ref 0–44)
AST: 18 U/L (ref 15–41)
Albumin: 4.3 g/dL (ref 3.5–5.0)
Alkaline Phosphatase: 69 U/L (ref 38–126)
Anion gap: 7 (ref 5–15)
BUN: 25 mg/dL — ABNORMAL HIGH (ref 8–23)
CO2: 24 mmol/L (ref 22–32)
Calcium: 9.8 mg/dL (ref 8.9–10.3)
Chloride: 104 mmol/L (ref 98–111)
Creatinine, Ser: 1.35 mg/dL — ABNORMAL HIGH (ref 0.44–1.00)
GFR, Estimated: 40 mL/min — ABNORMAL LOW (ref >60)
Glucose, Bld: 82 mg/dL (ref 70–99)
Potassium: 3.9 mmol/L (ref 3.5–5.1)
Sodium: 135 mmol/L (ref 135–145)
Total Bilirubin: 0.3 mg/dL (ref 0.3–1.2)
Total Protein: 7.3 g/dL (ref 6.5–8.1)

## 2022-04-10 LAB — MAGNESIUM: Magnesium: 2 mg/dL (ref 1.7–2.4)

## 2022-04-10 MED ORDER — IOHEXOL 300 MG/ML  SOLN
80.0000 mL | Freq: Once | INTRAMUSCULAR | Status: AC | PRN
  Administered 2022-04-10: 80 mL via INTRAVENOUS

## 2022-04-12 LAB — CA 125: Cancer Antigen (CA) 125: 7.5 U/mL (ref 0.0–38.1)

## 2022-04-15 ENCOUNTER — Ambulatory Visit: Payer: 59 | Admitting: Internal Medicine

## 2022-04-15 NOTE — Progress Notes (Signed)
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 hyperattenuating left renal lower pole exophytic lesion, too small to definitively characterize. Attention on follow-up, alternatively consider renal protocol CT or contrast-enhanced MR. 5. Similar pericystic stranding. 6. Aortic Atherosclerosis (ICD10-I70.0). Coronary artery calcifications. Assessment for potential risk factor modification, dietary therapy or pharmacologic therapy may be warranted, if clinically indicated. Electronically Signed   By: Darrin Nipper M.D.   On: 04/12/2022 20:59   US Venous Img Lower Bilateral (DVT)  Result Date: 03/28/2022 CLINICAL DATA:  Right foot and ankle edema for 2 days. Left lower extremity edema for 6-7 months. Elevated D-dimer.  Shortness of breath and chest pain. Chemo for ovarian cancer. EXAM: Bilateral LOWER EXTREMITY VENOUS DOPPLER ULTRASOUND TECHNIQUE: Gray-scale sonography with compression, as well as color and duplex ultrasound, were performed to evaluate the deep venous system(s) from the level of the common femoral vein through the popliteal and proximal calf veins. COMPARISON:  Lower extremity ultrasound 12/27/2021 FINDINGS: VENOUS Normal compressibility of the common femoral, superficial femoral, and popliteal veins, as well as the visualized calf veins. Visualized portions of profunda femoral vein and great saphenous vein unremarkable. No filling defects to suggest DVT on grayscale or color Doppler imaging. Doppler waveforms show normal direction of venous flow, normal respiratory plasticity and response to augmentation. Limited views of the contralateral common femoral vein are unremarkable. OTHER None. Limitations: none IMPRESSION: Negative. Electronically Signed   By: Placido Sou M.D.   On: 03/28/2022 00:42   DG Chest 2 View  Result Date: 03/27/2022 CLINICAL DATA:  Chest pain EXAM: CHEST - 2 VIEW COMPARISON:  Chest x-ray 08/07/2021 FINDINGS: Right chest port catheter tip projects over the SVC. The heart size and mediastinal contours are within normal limits. Both lungs are clear. The visualized skeletal structures are unremarkable. IMPRESSION: No active cardiopulmonary disease. Electronically Signed   By: Ronney Asters M.D.   On: 03/27/2022 23:06   DG C-Arm 1-60 Min-No Report  Result Date: 03/18/2022 Fluoroscopy was utilized by the requesting physician.  No radiographic interpretation.

## 2022-04-16 ENCOUNTER — Other Ambulatory Visit: Payer: Self-pay | Admitting: *Deleted

## 2022-04-16 ENCOUNTER — Other Ambulatory Visit (HOSPITAL_BASED_OUTPATIENT_CLINIC_OR_DEPARTMENT_OTHER): Payer: Self-pay

## 2022-04-16 ENCOUNTER — Inpatient Hospital Stay: Payer: 59 | Attending: Hematology | Admitting: Hematology

## 2022-04-16 ENCOUNTER — Other Ambulatory Visit: Payer: Self-pay

## 2022-04-16 VITALS — BP 160/72 | HR 84 | Temp 98.3°F | Resp 18 | Ht 63.0 in | Wt 191.7 lb

## 2022-04-16 DIAGNOSIS — N189 Chronic kidney disease, unspecified: Secondary | ICD-10-CM | POA: Insufficient documentation

## 2022-04-16 DIAGNOSIS — D631 Anemia in chronic kidney disease: Secondary | ICD-10-CM | POA: Diagnosis not present

## 2022-04-16 DIAGNOSIS — C563 Malignant neoplasm of bilateral ovaries: Secondary | ICD-10-CM | POA: Diagnosis not present

## 2022-04-16 DIAGNOSIS — N1330 Unspecified hydronephrosis: Secondary | ICD-10-CM | POA: Diagnosis not present

## 2022-04-16 DIAGNOSIS — I129 Hypertensive chronic kidney disease with stage 1 through stage 4 chronic kidney disease, or unspecified chronic kidney disease: Secondary | ICD-10-CM | POA: Insufficient documentation

## 2022-04-16 DIAGNOSIS — R1031 Right lower quadrant pain: Secondary | ICD-10-CM | POA: Diagnosis not present

## 2022-04-16 DIAGNOSIS — Z808 Family history of malignant neoplasm of other organs or systems: Secondary | ICD-10-CM | POA: Insufficient documentation

## 2022-04-16 DIAGNOSIS — Z803 Family history of malignant neoplasm of breast: Secondary | ICD-10-CM | POA: Insufficient documentation

## 2022-04-16 DIAGNOSIS — C569 Malignant neoplasm of unspecified ovary: Secondary | ICD-10-CM

## 2022-04-16 DIAGNOSIS — I10 Essential (primary) hypertension: Secondary | ICD-10-CM | POA: Diagnosis not present

## 2022-04-16 DIAGNOSIS — Z90710 Acquired absence of both cervix and uterus: Secondary | ICD-10-CM | POA: Insufficient documentation

## 2022-04-16 MED ORDER — TRAMADOL HCL 50 MG PO TABS
100.0000 mg | ORAL_TABLET | Freq: Three times a day (TID) | ORAL | 0 refills | Status: DC | PRN
  Filled 2022-04-16: qty 180, 30d supply, fill #0

## 2022-04-16 MED ORDER — TRAMADOL HCL 50 MG PO TABS: 100.0000 mg | ORAL_TABLET | Freq: Three times a day (TID) | ORAL | 0 refills | Status: DC | PRN

## 2022-04-16 NOTE — Patient Instructions (Addendum)
[place] at [facility] Discharge Instructions   You were seen and examined today by Dr. Delton Coombes.  He reviewed the results of your CT scan. There is no change from the previous scan. The cancer has not grown or spread.   He reviewed the results of your lab work which are normal/stable. Your kidney function is stable. Your tumor marker (CA-125) is normal.        Thank you for choosing [place] at [facility] to provide your oncology and hematology care.  To afford each patient quality time with our provider, please arrive at least 15 minutes before your scheduled appointment time.   If you have a lab appointment with the [place] please come in thru the Main Entrance and check in at the main information desk.  You need to re-schedule your appointment should you arrive 10 or more minutes late.  We strive to give you quality time with our providers, and arriving late affects you and other patients whose appointments are after yours.  Also, if you no show three or more times for appointments you may be dismissed from the clinic at the providers discretion.     Again, thank you for choosing [facility].  Our hope is that these requests will decrease the amount of time that you wait before being seen by our physicians.       _____________________________________________________________  Should you have questions after your visit to [facility], please contact our office at [phone] and follow the prompts.  Our office hours are 8:00 a.m. and 4:30 p.m. Monday - Friday.  Please note that voicemails left after 4:00 p.m. may not be returned until the following business day.  We are closed weekends and major holidays.  You do have access to a nurse 24-7, just call the main number to the clinic [phone] and do not press any options, hold on the line and a nurse will answer the phone.    For prescription refill  requests, have your pharmacy contact our office and allow 72 hours.    Due to Covid, you will need to wear a mask upon entering the hospital. If you do not have a mask, a mask will be given to you at the Main Entrance upon arrival. For doctor visits, patients may have 1 support person age 50 or older with them. For treatment visits, patients can not have anyone with them due to social distancing guidelines and our immunocompromised population.

## 2022-04-17 ENCOUNTER — Other Ambulatory Visit (HOSPITAL_BASED_OUTPATIENT_CLINIC_OR_DEPARTMENT_OTHER): Payer: Self-pay

## 2022-04-18 ENCOUNTER — Ambulatory Visit: Payer: 59 | Admitting: Internal Medicine

## 2022-04-23 DIAGNOSIS — N130 Hydronephrosis with ureteropelvic junction obstruction: Secondary | ICD-10-CM | POA: Diagnosis not present

## 2022-04-23 DIAGNOSIS — R1031 Right lower quadrant pain: Secondary | ICD-10-CM | POA: Diagnosis not present

## 2022-04-24 ENCOUNTER — Ambulatory Visit: Payer: Self-pay

## 2022-04-24 NOTE — Patient Instructions (Signed)
Visit Information  Thank you for taking time to visit with me today. Please don't hesitate to contact me if I can be of assistance to you.   Following are the goals we discussed today:   Goals Addressed             This Visit's Progress    Assistance with managing health concerns       Interventions Today    Flowsheet Row Most Recent Value  Chronic Disease   Chronic disease during today's visit Hypertension (HTN), Diabetes, Other  [ovarian cancer, A1C 5.7 on 02/14/22]  General Interventions   General Interventions Discussed/Reviewed General Interventions Reviewed, Doctor Visits  Doctor Visits Discussed/Reviewed Doctor Visits Reviewed  [reviewed patient instructions from f/u visit on 03/28/22, 04/04/22. reviewed upcoming appointments]  Exercise Interventions   Exercise Discussed/Reviewed Physical Activity  [encouraged to remain as active as tolerated or per provider recommendation]  Education Interventions   Education Provided Provided Education  Provided Verbal Education On Other, When to see the doctor  [encouraged to continue to attend provider visits as recommended and notify provider if any health questions or concerns. discussed pain medication and encouraged to take pain medication as prescribed as needed to help with pain management.]  Mental Health Interventions   Mental Health Discussed/Reviewed Other  [advised to continue to use strategies instructed per LCSW to help with anxiety/coping]  Nutrition Interventions   Nutrition Discussed/Reviewed Nutrition Discussed  Pharmacy Interventions   Pharmacy Dicussed/Reviewed Pharmacy Topics Reviewed              Our next appointment is by telephone on 05/15/22 at 11:30 am  Please call the care guide team at [phone] if you need to cancel or reschedule your appointment.   If you are experiencing a Mental Health or Behavioral Health Crisis or need someone to talk to, please call the Suicide and Crisis Lifeline: 70  [name], RN, MSN, BSN, CCM [facility] Care Coordinator [phone]

## 2022-04-24 NOTE — Patient Outreach (Signed)
  Care Coordination   Follow Up Visit Note   04/24/2022 Name: Laura [name] MRN: [id number] DOB: 08/05/1945  Laura [name] is a 77 y.o. year old female who sees Corwin Levins, MD for primary care. I spoke with  Laura [name] by phone today.  What matters to the patients health and wellness today?  Laura [name] states overall she is doing better, primary concern today is pain that runs down right leg. She reports per provider possibly related to stent. She is being followed by [facility] and is scheduled to have stent change on 05/06/22. She states she has pain medication that helps. Per review of chart Tramadol increased to 100 mg. Laura [name] states that she has not increased dose due to concerns Tramadol may be addictive. But states Tramadol 50 mg works most of the time and if not relieved in an hour will take a second one.   Goals Addressed             This Visit's Progress    Assistance with managing health concerns       Interventions Today    Flowsheet Row Most Recent Value  Chronic Disease   Chronic disease during today's visit Hypertension (HTN), Diabetes, Other  [ovarian cancer, A1C 5.7 on 02/14/22]  General Interventions   General Interventions Discussed/Reviewed General Interventions Reviewed, Doctor Visits  Doctor Visits Discussed/Reviewed Doctor Visits Reviewed  [reviewed patient instructions from f/u visit on 03/28/22, 04/04/22. reviewed upcoming appointments]  Exercise Interventions   Exercise Discussed/Reviewed Physical Activity  [encouraged to remain as active as tolerated or per provider recommendation]  Education Interventions   Education Provided Provided Education  Provided Verbal Education On Other, When to see the doctor  [encouraged to continue to attend provider visits as recommended and notify provider if any health questions or concerns. discussed pain medication and encouraged to take pain medication as prescribed as needed to help with pain management.]  Mental  Health Interventions   Mental Health Discussed/Reviewed Other  [advised to continue to use strategies instructed per LCSW to help with anxiety/coping]  Nutrition Interventions   Nutrition Discussed/Reviewed Nutrition Discussed  Pharmacy Interventions   Pharmacy Dicussed/Reviewed Pharmacy Topics Reviewed            SDOH assessments and interventions completed:  No  Care Coordination Interventions:  Yes, provided   Follow up plan: Follow up call scheduled for 05/15/22    Encounter Outcome:  Pt. Visit Completed   [name], RN, MSN, BSN, CCM [facility] Care Coordinator [phone]

## 2022-04-25 ENCOUNTER — Other Ambulatory Visit: Payer: Self-pay | Admitting: Urology

## 2022-04-25 NOTE — Patient Instructions (Signed)
SURGICAL WAITING ROOM VISITATION Patients having surgery or a procedure may have no more than 2 support people in the waiting area - these visitors may rotate.    Children under the age of 92 must have an adult with them who is not the patient.  If the patient needs to stay at the hospital during part of their recovery, the visitor guidelines for inpatient rooms apply. Pre-op nurse will coordinate an appropriate time for 1 support person to accompany patient in pre-op.  This support person may not rotate.    Please refer to the [facility] website for the visitor guidelines for Inpatients (after your surgery is over and you are in a regular room).       Your procedure is scheduled on: 05-06-22   Report to [facility] Main Entrance    Report to admitting at 12:45 PM   Call this number if you have problems the morning of surgery [phone]   Do not eat food or drink liquids :After Midnight.           If you have questions, please contact your surgeon's office.   FOLLOW  ANY ADDITIONAL PRE OP INSTRUCTIONS YOU RECEIVED FROM YOUR SURGEON'S OFFICE!!!     Oral Hygiene is also important to reduce your risk of infection.                                    Remember - BRUSH YOUR TEETH THE MORNING OF SURGERY WITH YOUR REGULAR TOOTHPASTE   Do NOT smoke after Midnight   Take these medicines the morning of surgery with A SIP OF WATER:   Amlodipine  Pepcid  Claritin  Lorazepam  Solifenacin  Tramadol or Tylenol if needed  How to Manage Your Diabetes Before and After Surgery  Why is it important to control my blood sugar before and after surgery? Improving blood sugar levels before and after surgery helps healing and can limit problems. A way of improving blood sugar control is eating a healthy diet by:  Eating less sugar and carbohydrates  Increasing activity/exercise  Talking with your doctor about reaching your blood sugar goals High blood sugars (greater than 180 mg/dL)  can raise your risk of infections and slow your recovery, so you will need to focus on controlling your diabetes during the weeks before surgery. Make sure that the doctor who takes care of your diabetes knows about your planned surgery including the date and location.  How do I manage my blood sugar before surgery? Check your blood sugar at least 4 times a day, starting 2 days before surgery, to make sure that the level is not too high or low. Check your blood sugar the morning of your surgery when you wake up and every 2 hours until you get to the Short Stay unit. If your blood sugar is less than 70 mg/dL, you will need to treat for low blood sugar: Do not take insulin. Treat a low blood sugar (less than 70 mg/dL) with  cup of clear juice (cranberry or apple), 4 glucose tablets, OR glucose gel. Recheck blood sugar in 15 minutes after treatment (to make sure it is greater than 70 mg/dL). If your blood sugar is not greater than 70 mg/dL on recheck, call [phone] for further instructions. Report your blood sugar to the short stay nurse when you get to Short Stay.  If you are admitted to the hospital after  surgery: Your blood sugar will be checked by the staff and you will probably be given insulin after surgery (instead of oral diabetes medicines) to make sure you have good blood sugar levels. The goal for blood sugar control after surgery is 80-180 mg/dL.   WHAT DO I DO ABOUT MY DIABETES MEDICATION?  Do not take oral diabetes medicines (pills) the morning of surgery.  DO NOT TAKE THE FOLLOWING 7 DAYS PRIOR TO SURGERY: Ozempic, Wegovy, Rybelsus (Semaglutide), Byetta (exenatide), Bydureon (exenatide ER), Victoza, Saxenda (liraglutide), or Trulicity (dulaglutide) Mounjaro (Tirzepatide) Adlyxin (Lixisenatide), Polyethylene Glycol Loxenatide.  Reviewed and Endorsed by [facility] Patient Education Committee, August 2015  Bring CPAP mask and tubing day of surgery.                               You may not have any metal on your body including hair pins, jewelry, and body piercing             Do not wear make-up, lotions, powders, perfumes/ or deodorant  Do not wear nail polish including gel and S&S, artificial/acrylic nails, or any other type of covering on natural nails including finger and toenails. If you have artificial nails, gel coating, etc. that needs to be removed by a nail salon please have this removed prior to surgery or surgery may need to be canceled/ delayed if the surgeon/ anesthesia feels like they are unable to be safely monitored.   Do not shave  48 hours prior to surgery.    Do not bring valuables to the hospital. [facility] IS NOT RESPONSIBLE   FOR VALUABLES.   Contacts, dentures or bridgework may not be worn into surgery.   Bring small overnight bag day of surgery.   DO NOT BRING YOUR HOME MEDICATIONS TO THE HOSPITAL. PHARMACY WILL DISPENSE MEDICATIONS LISTED ON YOUR MEDICATION LIST TO YOU DURING YOUR ADMISSION IN THE HOSPITAL!    Patients discharged on the day of surgery will not be allowed to drive home.  Someone NEEDS to stay with you for the first 24 hours after anesthesia.   Special Instructions: Bring a copy of your healthcare power of attorney and living will documents the day of surgery if you haven't scanned them before.              Please read over the following fact sheets you were given: IF YOU HAVE QUESTIONS ABOUT YOUR PRE-OP INSTRUCTIONS PLEASE CALL [phone]   If you received a COVID test during your pre-op visit  it is requested that you wear a mask when out in public, stay away from anyone that may not be feeling well and notify your surgeon if you develop symptoms. If you test positive for Covid or have been in contact with anyone that has tested positive in the last 10 days please notify you surgeon.  [facility] - Preparing for Surgery Before surgery, you can play an important role.  Because skin is not sterile, your skin needs to  be as free of germs as possible.  You can reduce the number of germs on your skin by washing with CHG (chlorahexidine gluconate) soap before surgery.  CHG is an antiseptic cleaner which kills germs and bonds with the skin to continue killing germs even after washing. Please DO NOT use if you have an allergy to CHG or antibacterial soaps.  If your skin becomes reddened/irritated stop using the CHG and inform your nurse when  you arrive at Short Stay. Do not shave (including legs and underarms) for at least 48 hours prior to the first CHG shower.  You may shave your face/neck.  Please follow these instructions carefully:  1.  Shower with CHG Soap the night before surgery and the  morning of surgery.  2.  If you choose to wash your hair, wash your hair first as usual with your normal  shampoo.  3.  After you shampoo, rinse your hair and body thoroughly to remove the shampoo.                             4.  Use CHG as you would any other liquid soap.  You can apply chg directly to the skin and wash.  Gently with a scrungie or clean washcloth.  5.  Apply the CHG Soap to your body ONLY FROM THE NECK DOWN.   Do   not use on face/ open                           Wound or open sores. Avoid contact with eyes, ears mouth and   genitals (private parts).                       Wash face,  Genitals (private parts) with your normal soap.             6.  Wash thoroughly, paying special attention to the area where your    surgery  will be performed.  7.  Thoroughly rinse your body with warm water from the neck down.  8.  DO NOT shower/wash with your normal soap after using and rinsing off the CHG Soap.                9.  Pat yourself dry with a clean towel.            10.  Wear clean pajamas.            11.  Place clean sheets on your bed the night of your first shower and do not  sleep with pets. Day of Surgery : Do not apply any lotions/deodorants the morning of surgery.  Please wear clean clothes to the  hospital/surgery center.  FAILURE TO FOLLOW THESE INSTRUCTIONS MAY RESULT IN THE CANCELLATION OF YOUR SURGERY  PATIENT SIGNATURE_________________________________  NURSE SIGNATURE__________________________________  ________________________________________________________________________

## 2022-04-25 NOTE — Progress Notes (Signed)
COVID Vaccine Completed:  Date of COVID positive in last 90 days:  PCP - Oliver Barre, MD Cardiologist - Weston Brass, MD Pulmonologist - Cyril Mourning, MD (last OV 2017)  Chest x-ray - 03-27-22 Epic EKG - 03-31-22 Epic Stress Test -  ECHO - 08-09-21 Epic Cardiac Cath -  Pacemaker/ICD device last checked: Spinal Cord Stimulator:  Bowel Prep -   Sleep Study - Yes, +sleep apnea CPAP -   Fasting Blood Sugar -  Checks Blood Sugar _____ times a day  Last dose of GLP1 agonist-  N/A GLP1 instructions:  N/A   Last dose of SGLT-2 inhibitors-  N/A SGLT-2 instructions: N/A   Blood Thinner Instructions: Aspirin Instructions: Last Dose:  Activity level:  Can go up a flight of stairs and perform activities of daily living without stopping and without symptoms of chest pain or shortness of breath.  Able to exercise without symptoms  Unable to go up a flight of stairs without symptoms of     Anesthesia review:  Dyspnea followed by cardiology.   Murmur, HTN, DM, OSA, CKD, ovarian cancer  Patient denies shortness of breath, fever, cough and chest pain at PAT appointment  Patient verbalized understanding of instructions that were given to them at the PAT appointment. Patient was also instructed that they will need to review over the PAT instructions again at home before surgery.

## 2022-04-28 ENCOUNTER — Encounter (HOSPITAL_COMMUNITY)
Admission: RE | Admit: 2022-04-28 | Discharge: 2022-04-28 | Disposition: A | Discharge status: Home / Self Care | Payer: 59 | Source: Ambulatory Visit | Attending: Urology | Admitting: Urology

## 2022-04-28 ENCOUNTER — Other Ambulatory Visit: Payer: Self-pay

## 2022-04-28 ENCOUNTER — Encounter (HOSPITAL_COMMUNITY): Payer: Self-pay

## 2022-04-28 ENCOUNTER — Other Ambulatory Visit: Payer: Self-pay | Admitting: Urology

## 2022-04-28 VITALS — BP 131/69 | HR 74 | Temp 98.4°F | Resp 16 | Ht 63.0 in | Wt 191.0 lb

## 2022-04-28 DIAGNOSIS — Z01812 Encounter for preprocedural laboratory examination: Secondary | ICD-10-CM | POA: Diagnosis not present

## 2022-04-28 DIAGNOSIS — E119 Type 2 diabetes mellitus without complications: Secondary | ICD-10-CM | POA: Diagnosis not present

## 2022-04-28 LAB — HEMOGLOBIN A1C
Hgb A1c MFr Bld: 6 % — ABNORMAL HIGH (ref 4.8–5.6)
Mean Plasma Glucose: 125.5 mg/dL

## 2022-04-28 LAB — GLUCOSE, CAPILLARY: Glucose-Capillary: 161 mg/dL — ABNORMAL HIGH (ref 70–99)

## 2022-04-28 NOTE — Progress Notes (Signed)
Attempted to call pharmacy x2 with no answer.  Reviewed medications with patient.  Patient stated that she has the number to call pharmacy but nobody has answered when she called.

## 2022-05-02 NOTE — H&P (Signed)
CC/HPI: cc: hydronephrosis    04/02/2022: 77 year old woman with a history of stage IIIc ovarian cancer and retroperitoneal mass causing obstruction of right ureter currently managed with right indwelling stent. She last underwent a right ureteral stent exchange 03/18/2022. She has been experiencing intermittent right lower quadrant pain. She is also had bilateral lower extremity edema and was seen in the ER to rule out DVTs. She sees her PCP tomorrow and her oncologist in April. Labs from ED showed BUN 27, creatinine 1.39, EGFR 39 on 03/27/22. We have discussed right nephrostomy tube in detail and patient does not wish to proceed with this.   04/23/22: 77 yo woman with recent stent exhange last month here with persistent intermittent right renal colic. She had recent CT that shows stability of 6.7 cm retroperitoneal mass compressing right ureter and resulting in hydronephrosis. She has previously tolerated stent exchanges well but this last time has had increased pain. She has refused nephrostomy tubes several times.     ALLERGIES: Cipro hydrocodone - Dizziness Prednisone - Anxiety sulfa - Hives    MEDICATIONS: Airborne  Amlodipine Besilate  Claritin  Glipizide  Metamucil Fiber Thin  Multiple Vitamin  Pantoprazole Sodium PRN  Tramadol Hcl 50 mg tablet 1 tablet PO Q 6 H PRN  Tramadol Hcl 50 mg tablet 1 tablet PO Q 6 H PRN severe right lower quadrant pain  Tramadol Hcl 50 mg tablet 1 tablet PO Q 8 H PRN severe abdominal pain  Tylenol PRN  Vitamin B12     GU PSH: Cystocele Repair Cystoscopy Insert Stent - 03/18/2022, 10/01/2021, 03/13/2021     NON-GU PSH: Breast Biopsy, Bilateral Cholecystectomy (laparoscopic) Partial Hysterectomy         GU PMH: Hydronephrosis - 04/02/2022, - 02/18/2022, - 10/15/2021, - 09/06/2021, - 06/24/2021, - 03/21/2021, - 03/19/2021, - 03/12/2021 Nocturia - 04/02/2022, (Stable), - 02/18/2022, I have recommended a trial of Myrbetriq 50 mg. I told her that if she finds that this  does not allow her to reach her goal we could potentially try a combination of this medication and an anticholinergic. She will return reassessment., - 2021 RLQ pain - 04/02/2022 Detrusor overactivity - 03/19/2021, Her symptoms are most consistent with bladder overactivity and she is emptying her bladder so we did discuss a trial of an alternative pharmacologic agent., - 2021 Acute kidney failure - 03/12/2021    NON-GU PMH: Anxiety Arthritis Cardiac murmur, unspecified Depression Diabetes Type 2 GERD Gout Hypercholesterolemia Hypertension Sleep Apnea    FAMILY HISTORY: 2 daughters - Daughter 2 sons - Son Hypertension - Mother, Father, Runs in Family sickle cell anemia - Daughter stroke - Sister   SOCIAL HISTORY: Marital Status: Divorced Preferred Language: English; Ethnicity: Not Hispanic Or Latino; Race: White Current Smoking Status: Patient has never smoked.  <DIV'  Tobacco Use Assessment Completed:  Used Tobacco in last 30 days?   Has never drank.  Drinks 2 caffeinated drinks per day.    REVIEW OF SYSTEMS:     GU Review Female:  Patient denies frequent urination, hard to postpone urination, burning /pain with urination, get up at night to urinate, leakage of urine, stream starts and stops, trouble starting your stream, have to strain to urinate, and being pregnant.    Gastrointestinal (Upper):  Patient denies nausea, vomiting, and indigestion/ heartburn.    Gastrointestinal (Lower):  Patient denies constipation and diarrhea.    Constitutional:  Patient denies fever, night sweats, weight loss, and fatigue.    Skin:  Patient denies skin rash/ lesion  and itching.    Eyes:  Patient denies blurred vision and double vision.    Ears/ Nose/ Throat:  Patient denies sore throat and sinus problems.    Hematologic/Lymphatic:  Patient denies swollen glands and easy bruising.    Cardiovascular:  Patient denies leg swelling and chest pains.    Respiratory:  Patient denies cough and shortness  of breath.    Endocrine:  Patient denies excessive thirst.    Musculoskeletal:  Patient denies back pain and joint pain.    Neurological:  Patient denies headaches and dizziness.    Psychologic:  Patient denies depression and anxiety.    VITAL SIGNS:       04/23/2022 10:35 AM     Weight 171 lb / 77.56 kg     Height 63 in / 160.02 cm     BP 158/84 mmHg     Heart Rate 66 /min     Temperature 97.8 F / 36.5 C     BMI 30.3 kg/m     MULTI-SYSTEM PHYSICAL EXAMINATION:      Constitutional: Well-nourished. No physical deformities. Normally developed. Good grooming.     Neck: Neck symmetrical, not swollen. Normal tracheal position.     Respiratory: No labored breathing, no use of accessory muscles.      Skin: No paleness, no jaundice, no cyanosis. No lesion, no ulcer, no rash.     Neurologic / Psychiatric: Oriented to time, oriented to place, oriented to person. No depression, no anxiety, no agitation.     Eyes: Normal conjunctivae. Normal eyelids.     Ears, Nose, Mouth, and Throat: Left ear no scars, no lesions, no masses. Right ear no scars, no lesions, no masses. Nose no scars, no lesions, no masses. Normal hearing. Normal lips.     Musculoskeletal: Normal gait and station of head and neck.            Complexity of Data:   Records Review:  Previous Patient Records, POC Tool  Urine Test Review:  Urinalysis  X-Ray Review: C.T. Abdomen/Pelvis: Reviewed Films. Reviewed Report. Discussed With Patient. IMPRESSION:  1. Similar right hydroureteronephrosis upstream of an unchanged  necrotic retroperitoneal nodal mass measuring 6.7 x 3.9 cm. Right  ureteral stent in-situ.  2. Unchanged 1.2 cm left para-aortic lymph node suspicious for  metastasis.  3. Similar left hemipelvic nodularity near the left ureterovesical  junction.  4. Stable 6 mm hyperattenuating left renal lower pole exophytic  lesion, too small to definitively characterize. Attention on  follow-up, alternatively consider renal  protocol CT or  contrast-enhanced MR.  5. Similar pericystic stranding.  6. Aortic Atherosclerosis (ICD10-I70.0). Coronary artery  calcifications. Assessment for potential risk factor modification,  dietary therapy or pharmacologic therapy may be warranted, if  clinically indicated.      Notes:  04/10/22: BUN 25, creatinine 1.35   PROCEDURES:    Urinalysis w/Scope  Dipstick Dipstick Cont'd Micro  Color: Yellow Bilirubin: Neg mg/dL WBC/hpf: 0 - 5/hpf  Appearance: Cloudy Ketones: Neg mg/dL RBC/hpf: 0 - 2/hpf  Specific Gravity: 1.025 Blood: Neg ery/uL Bacteria: Rare (0-9/hpf)  pH: 6.0 Protein: 2+ mg/dL Cystals: NS (Not Seen)  Glucose: Neg mg/dL Urobilinogen: 0.2 mg/dL Casts: Hyaline   Nitrites: Neg Trichomonas: Not Present   Leukocyte Esterase: 1+ leu/uL Mucous: Present    Epithelial Cells: 0 - 5/hpf    Yeast: NS (Not Seen)    Sperm: Not Present   Notes: qns to spin     ASSESSMENT:     ICD-10 Details  1 GU:  Hydronephrosis - N13.0 Chronic, Stable  2  RLQ pain - R10.31 Chronic, Worsening   PLAN:   Document  Letter(s):  Created for Patient: Clinical Summary   Notes:  1. Right hydronephrosis/flank pain:  -I discussed with patient source of her pain is likely the persistent hydronephrosis from the necrotic retroperitoneal mass compressing the right ureter. Oftentimes with extrinsic compression the stents fail and the blockage of the right kidney will cause significant renal colic.  -I again discussed the nephrostomy tube would likely alleviate this obstruction and may improve her pain. Patient does not want to live with a tube in her back.  -She has had previous success with stent exchanges and was wondering if we might consider exchanging the stent to see if repositioning might be more comfortable. I do think this is a reasonable option for a one-time procedure to reposition stent but do not think this will provide her much relief.   Will schedule next available surgery

## 2022-05-05 DIAGNOSIS — N130 Hydronephrosis with ureteropelvic junction obstruction: Secondary | ICD-10-CM | POA: Diagnosis not present

## 2022-05-05 DIAGNOSIS — R1031 Right lower quadrant pain: Secondary | ICD-10-CM | POA: Diagnosis not present

## 2022-05-06 ENCOUNTER — Ambulatory Visit (HOSPITAL_COMMUNITY): Payer: 59 | Admitting: Physician Assistant

## 2022-05-06 ENCOUNTER — Ambulatory Visit (HOSPITAL_COMMUNITY)
Admission: RE | Admit: 2022-05-06 | Discharge: 2022-05-06 | Disposition: A | Discharge status: Home / Self Care | Payer: 59 | Source: Ambulatory Visit | Attending: Urology | Admitting: Urology

## 2022-05-06 ENCOUNTER — Encounter (HOSPITAL_COMMUNITY)
Admission: RE | Disposition: A | Discharge status: Home / Self Care | Payer: Self-pay | Source: Ambulatory Visit | Attending: Urology

## 2022-05-06 ENCOUNTER — Ambulatory Visit (HOSPITAL_BASED_OUTPATIENT_CLINIC_OR_DEPARTMENT_OTHER): Payer: 59 | Admitting: Anesthesiology

## 2022-05-06 ENCOUNTER — Encounter (HOSPITAL_COMMUNITY): Payer: Self-pay | Admitting: Urology

## 2022-05-06 ENCOUNTER — Ambulatory Visit (HOSPITAL_COMMUNITY): Payer: 59

## 2022-05-06 ENCOUNTER — Other Ambulatory Visit: Payer: Self-pay

## 2022-05-06 DIAGNOSIS — K689 Other disorders of retroperitoneum: Secondary | ICD-10-CM | POA: Diagnosis not present

## 2022-05-06 DIAGNOSIS — I119 Hypertensive heart disease without heart failure: Secondary | ICD-10-CM | POA: Diagnosis not present

## 2022-05-06 DIAGNOSIS — G4730 Sleep apnea, unspecified: Secondary | ICD-10-CM | POA: Diagnosis not present

## 2022-05-06 DIAGNOSIS — R599 Enlarged lymph nodes, unspecified: Secondary | ICD-10-CM | POA: Diagnosis not present

## 2022-05-06 DIAGNOSIS — C569 Malignant neoplasm of unspecified ovary: Secondary | ICD-10-CM | POA: Diagnosis not present

## 2022-05-06 DIAGNOSIS — E119 Type 2 diabetes mellitus without complications: Secondary | ICD-10-CM

## 2022-05-06 DIAGNOSIS — I10 Essential (primary) hypertension: Secondary | ICD-10-CM | POA: Diagnosis not present

## 2022-05-06 DIAGNOSIS — R590 Localized enlarged lymph nodes: Secondary | ICD-10-CM | POA: Diagnosis not present

## 2022-05-06 DIAGNOSIS — N1330 Unspecified hydronephrosis: Secondary | ICD-10-CM | POA: Diagnosis not present

## 2022-05-06 DIAGNOSIS — N131 Hydronephrosis with ureteral stricture, not elsewhere classified: Secondary | ICD-10-CM | POA: Insufficient documentation

## 2022-05-06 DIAGNOSIS — N130 Hydronephrosis with ureteropelvic junction obstruction: Secondary | ICD-10-CM

## 2022-05-06 HISTORY — PX: CYSTOSCOPY WITH RETROGRADE PYELOGRAM, URETEROSCOPY AND STENT PLACEMENT: SHX5789

## 2022-05-06 LAB — GLUCOSE, CAPILLARY: Glucose-Capillary: 129 mg/dL — ABNORMAL HIGH (ref 70–99)

## 2022-05-06 SURGERY — CYSTOURETEROSCOPY, WITH RETROGRADE PYELOGRAM AND STENT INSERTION
Anesthesia: General | Laterality: Right

## 2022-05-06 MED ORDER — FENTANYL CITRATE (PF) 100 MCG/2ML IJ SOLN
INTRAMUSCULAR | Status: AC
  Filled 2022-05-06: qty 2

## 2022-05-06 MED ORDER — LIDOCAINE HCL (PF) 2 % IJ SOLN
INTRAMUSCULAR | Status: AC
  Filled 2022-05-06: qty 35

## 2022-05-06 MED ORDER — ACETAMINOPHEN 10 MG/ML IV SOLN
1000.0000 mg | Freq: Once | INTRAVENOUS | Status: DC | PRN
  Administered 2022-05-06: 1000 mg via INTRAVENOUS

## 2022-05-06 MED ORDER — PROPOFOL 10 MG/ML IV BOLUS
INTRAVENOUS | Status: AC
  Filled 2022-05-06: qty 20

## 2022-05-06 MED ORDER — OXYCODONE HCL 5 MG PO TABS
5.0000 mg | ORAL_TABLET | Freq: Once | ORAL | Status: AC | PRN
  Administered 2022-05-06: 5 mg via ORAL

## 2022-05-06 MED ORDER — LIDOCAINE HCL (CARDIAC) PF 100 MG/5ML IV SOSY
PREFILLED_SYRINGE | INTRAVENOUS | Status: DC | PRN
  Administered 2022-05-06: 100 mg via INTRAVENOUS

## 2022-05-06 MED ORDER — SODIUM CHLORIDE 0.9 % IR SOLN
Status: DC | PRN
  Administered 2022-05-06: 3000 mL via INTRAVESICAL

## 2022-05-06 MED ORDER — ORAL CARE MOUTH RINSE: 15.0000 mL | Freq: Once | OROMUCOSAL | Status: AC

## 2022-05-06 MED ORDER — GENTAMICIN SULFATE 40 MG/ML IJ SOLN
5.0000 mg/kg | Freq: Once | INTRAVENOUS | Status: AC
  Administered 2022-05-06: 430 mg via INTRAVENOUS
  Filled 2022-05-06: qty 10.75

## 2022-05-06 MED ORDER — OXYCODONE HCL 5 MG/5ML PO SOLN: 5.0000 mg | Freq: Once | ORAL | Status: AC | PRN

## 2022-05-06 MED ORDER — OXYCODONE HCL 5 MG PO TABS
ORAL_TABLET | ORAL | Status: AC
  Filled 2022-05-06: qty 1

## 2022-05-06 MED ORDER — LABETALOL HCL 5 MG/ML IV SOLN
INTRAVENOUS | Status: DC | PRN
  Administered 2022-05-06 (×2): 5 mg via INTRAVENOUS

## 2022-05-06 MED ORDER — IOHEXOL 300 MG/ML  SOLN
INTRAMUSCULAR | Status: DC | PRN
  Administered 2022-05-06: 20 mL

## 2022-05-06 MED ORDER — DIPHENHYDRAMINE HCL 50 MG/ML IJ SOLN
INTRAMUSCULAR | Status: DC | PRN
  Administered 2022-05-06: 12.5 mg via INTRAVENOUS

## 2022-05-06 MED ORDER — ONDANSETRON HCL 4 MG/2ML IJ SOLN
INTRAMUSCULAR | Status: DC | PRN
  Administered 2022-05-06: 4 mg via INTRAVENOUS

## 2022-05-06 MED ORDER — LACTATED RINGERS IV SOLN: INTRAVENOUS | Status: DC | PRN

## 2022-05-06 MED ORDER — ACETAMINOPHEN 10 MG/ML IV SOLN
INTRAVENOUS | Status: AC
  Filled 2022-05-06: qty 100

## 2022-05-06 MED ORDER — FENTANYL CITRATE (PF) 100 MCG/2ML IJ SOLN
INTRAMUSCULAR | Status: DC | PRN
  Administered 2022-05-06: 25 ug via INTRAVENOUS

## 2022-05-06 MED ORDER — ONDANSETRON HCL 4 MG/2ML IJ SOLN: 4.0000 mg | Freq: Once | INTRAMUSCULAR | Status: DC | PRN

## 2022-05-06 MED ORDER — LACTATED RINGERS IV SOLN: INTRAVENOUS | Status: DC

## 2022-05-06 MED ORDER — FENTANYL CITRATE PF 50 MCG/ML IJ SOSY: 25.0000 ug | PREFILLED_SYRINGE | INTRAMUSCULAR | Status: DC | PRN

## 2022-05-06 MED ORDER — CHLORHEXIDINE GLUCONATE 0.12 % MT SOLN
15.0000 mL | Freq: Once | OROMUCOSAL | Status: AC
  Administered 2022-05-06: 15 mL via OROMUCOSAL

## 2022-05-06 MED ORDER — 0.9 % SODIUM CHLORIDE (POUR BTL) OPTIME
TOPICAL | Status: DC | PRN
  Administered 2022-05-06: 1000 mL

## 2022-05-06 MED ORDER — PROPOFOL 10 MG/ML IV BOLUS
INTRAVENOUS | Status: DC | PRN
  Administered 2022-05-06: 150 mg via INTRAVENOUS

## 2022-05-06 SURGICAL SUPPLY — 20 items
BAG URO CATCHER STRL LF (MISCELLANEOUS) ×1 IMPLANT
BASKET ZERO TIP NITINOL 2.4FR (BASKET) IMPLANT
BSKT STON RTRVL ZERO TP 2.4FR (BASKET)
CATH URETL OPEN 5X70 (CATHETERS) ×1 IMPLANT
CLOTH BEACON ORANGE TIMEOUT ST (SAFETY) ×1 IMPLANT
DRSG TEGADERM 2-3/8X2-3/4 SM (GAUZE/BANDAGES/DRESSINGS) IMPLANT
EXTRACTOR STONE 1.7FRX115CM (UROLOGICAL SUPPLIES) IMPLANT
GLOVE BIO SURGEON STRL SZ 6.5 (GLOVE) ×1 IMPLANT
GOWN STRL REUS W/ TWL LRG LVL3 (GOWN DISPOSABLE) ×1 IMPLANT
GOWN STRL REUS W/TWL LRG LVL3 (GOWN DISPOSABLE) ×1
GUIDEWIRE STR DUAL SENSOR (WIRE) ×1 IMPLANT
GUIDEWIRE ZIPWRE .038 STRAIGHT (WIRE) IMPLANT
KIT TURNOVER KIT A (KITS) IMPLANT
MANIFOLD NEPTUNE II (INSTRUMENTS) ×1 IMPLANT
PACK CYSTO (CUSTOM PROCEDURE TRAY) ×1 IMPLANT
SHEATH NAVIGATOR HD 11/13X28 (SHEATH) IMPLANT
SHEATH NAVIGATOR HD 11/13X36 (SHEATH) IMPLANT
STENT URET 6FRX26 CONTOUR (STENTS) IMPLANT
TUBING CONNECTING 10 (TUBING) ×1 IMPLANT
TUBING UROLOGY SET (TUBING) ×1 IMPLANT

## 2022-05-06 NOTE — Interval H&P Note (Signed)
History and Physical Interval Note:  05/06/2022 2:04 PM  Laura [name]  has presented today for surgery, with the diagnosis of RIGHT HYDRONEPHROSIS.  The various methods of treatment have been discussed with the patient and family. After consideration of risks, benefits and other options for treatment, the patient has consented to  Procedure(s): CYSTOSCOPY WITH RIGHT RETROGRADE PYELOGRAM, AND RIGHT URETERAL STENT EXCHANGE (Right) as a surgical intervention.  The patient's history has been reviewed, patient examined, no change in status, stable for surgery.  I have reviewed the patient's chart and labs.  Questions were answered to the patient's satisfaction.     Eustolia Drennen D Tranell Wojtkiewicz

## 2022-05-06 NOTE — Anesthesia Procedure Notes (Signed)
Procedure Name: LMA Insertion Date/Time: 05/06/2022 3:07 PM  Performed by: Deri Fuelling, CRNAPre-anesthesia Checklist: Patient identified, Emergency Drugs available, Suction available and Patient being monitored Patient Re-evaluated:Patient Re-evaluated prior to induction Oxygen Delivery Method: Circle system utilized Preoxygenation: Pre-oxygenation with 100% oxygen Induction Type: IV induction Ventilation: Mask ventilation without difficulty LMA Size: 4.0 Tube type: Oral Number of attempts: 1 Airway Equipment and Method: Stylet and Oral airway Placement Confirmation: ETT inserted through vocal cords under direct vision, positive ETCO2 and breath sounds checked- equal and bilateral Tube secured with: Tape Dental Injury: Teeth and Oropharynx as per pre-operative assessment

## 2022-05-06 NOTE — Discharge Instructions (Signed)

## 2022-05-06 NOTE — Anesthesia Preprocedure Evaluation (Signed)
Anesthesia Evaluation  Patient identified by MRN, date of birth, ID band Patient awake    Reviewed: Allergy & Precautions, H&P , NPO status , Patient's Chart, lab work & pertinent test results  Airway Mallampati: II  TM Distance: >3 FB Neck ROM: Full    Dental no notable dental hx.    Pulmonary sleep apnea    Pulmonary exam normal breath sounds clear to auscultation       Cardiovascular hypertension, + DOE   Rhythm:Regular Rate:Normal + Systolic murmurs  Left Ventricle: Left ventricular ejection fraction, by estimation, is 65  to 70%. The left ventricle has normal function. The left ventricle has no  regional wall motion abnormalities. The left ventricular internal cavity  size was normal in size. There is   mild left ventricular hypertrophy. Left ventricular diastolic parameters  are consistent with Grade I diastolic dysfunction (impaired relaxation).   Right Ventricle: The right ventricular size is normal. No increase in  right ventricular wall thickness. Right ventricular systolic function is  normal. There is normal pulmonary artery systolic pressure. The tricuspid  regurgitant velocity is 2.45 m/s, and   with an assumed right atrial pressure of 3 mmHg, the estimated right  ventricular systolic pressure is 27.0 mmHg.   Left Atrium: Left atrial size was normal in size.   Right Atrium: Right atrial size was normal in size.   Pericardium: There is no evidence of pericardial effusion.   Mitral Valve: The mitral valve is normal in structure. Trivial mitral  valve regurgitation. No evidence of mitral valve stenosis.   Tricuspid Valve: The tricuspid valve is normal in structure. Tricuspid  valve regurgitation is mild to moderate. No evidence of tricuspid  stenosis.   Aortic Valve: The aortic valve is tricuspid. Aortic valve regurgitation is  not visualized. No aortic stenosis is present. Aortic valve peak gradient   measures 6.5 mmHg.   Pulmonic Valve: The pulmonic valve was normal in structure. Pulmonic valve  regurgitation is trivial. No evidence of pulmonic stenosis.   Aorta: The aortic root is normal in size and structure.     Neuro/Psych negative neurological ROS  negative psych ROS   GI/Hepatic negative GI ROS, Neg liver ROS,,,  Endo/Other  diabetes, Type 2    Renal/GU negative Renal ROS  negative genitourinary   Musculoskeletal negative musculoskeletal ROS (+)    Abdominal   Peds negative pediatric ROS (+)  Hematology negative hematology ROS (+)   Anesthesia Other Findings   Reproductive/Obstetrics negative OB ROS                             Anesthesia Physical Anesthesia Plan  ASA: 3  Anesthesia Plan: General   Post-op Pain Management: Minimal or no pain anticipated   Induction: Intravenous  PONV Risk Score and Plan: 3 and Ondansetron, Dexamethasone and Treatment may vary due to age or medical condition  Airway Management Planned: LMA  Additional Equipment:   Intra-op Plan:   Post-operative Plan: Extubation in OR  Informed Consent: I have reviewed the patients History and Physical, chart, labs and discussed the procedure including the risks, benefits and alternatives for the proposed anesthesia with the patient or authorized representative who has indicated his/her understanding and acceptance.     Dental advisory given  Plan Discussed with: CRNA and Surgeon  Anesthesia Plan Comments:        Anesthesia Quick Evaluation

## 2022-05-06 NOTE — Anesthesia Postprocedure Evaluation (Signed)
Anesthesia Post Note  Patient: Laura [name]  Procedure(s) Performed: CYSTOSCOPY WITH RIGHT RETROGRADE PYELOGRAM, AND RIGHT URETERAL STENT EXCHANGE (Right)     Patient location during evaluation: PACU Anesthesia Type: General Level of consciousness: awake and alert Pain management: pain level controlled Vital Signs Assessment: post-procedure vital signs reviewed and stable Respiratory status: spontaneous breathing, nonlabored ventilation, respiratory function stable and patient connected to nasal cannula oxygen Cardiovascular status: blood pressure returned to baseline and stable Postop Assessment: no apparent nausea or vomiting Anesthetic complications: no  No notable events documented.  Last Vitals:  Vitals:   05/06/22 1615 05/06/22 1636  BP: (!) 144/66 (!) 152/74  Pulse: 71 72  Resp: 15 17  Temp: 36.5 C 37.1 C  SpO2: 94% 100%    Last Pain:  Vitals:   05/06/22 1615  TempSrc:   PainSc: 2                  Steffan Caniglia S

## 2022-05-06 NOTE — Op Note (Signed)
Operative Note  Preoperative diagnosis:  1.  Right hydronephrosis secondary to bulky retroperitoneal lymphadenopathy 2.  Right flank/lower quadrant pain  Postoperative diagnosis: 1.  Right hydronephrosis secondary to bulky retroperitoneal lymphadenopathy 2.  Right flank/lower quadrant pain  Procedure(s): 1.  Cystoscopy with right retrograde pyelogram 2.  Right ureteral stent exchange 6 French by 24 cm double-J no tether  Surgeon: Kasandra Knudsen, MD  Assistants:  None  Anesthesia:  General  Complications:  None  EBL: None  Specimens: 1.  None  Drains/Catheters: 1.  6 French by 24 cm double-J no tether right side  Intraoperative findings:   Normal urethra Normal bladder mucosa without masses Bilateral orthotopic ureteral orifices however right UO is located very close to the bladder neck and easier to see if finger is placed in the vagina and elevated Severe right hydronephrosis to transition point in proximal ureter  Indication:  Laura [name] is a 77 y.o. female with stage IIIc ovarian cancer and retroperitoneal mass causing obstruction of right ureter currently managed with indwelling right ureteral stent.  She most recently underwent right ureteral stent exchange last month and has had persistent right flank and right lower quadrant pain since then.  We discussed attempting a stent exchange to see if this would help her discomfort.  Description of procedure:  Risks and benefits of the procedure discussed the patient and informed consent obtained.  The patient is taken to the operating room placed in supine position.  Anesthesia induced and antibiotics were administered.  The patient was then prepped and draped in usual sterile fashion and timeout was performed.  A 21 French rigid cystoscope was placed in urethra meatus and advanced in the bladder and direct visualization.  The existing right ureteral stent was seen emanating from the right ureteral orifice.  A grasper was  used to grab the stent and bring it to the meatus.  A 0.38 sensor wire was advanced through the ureteral stent up to the kidney.  There was resistance met.  The stent was removed and deemed to be intact.    An open-ended ureteral catheter was then placed over the wire and the wire was removed.  A retrograde pyelogram was obtained which showed severe hydronephrosis to a transition point in the proximal ureter.  The open-ended ureteral catheter was not able to traverse the proximal ureter.  Replacement of the sensor wire through the open-ended ureteral catheter was not successful.  Several attempts were made at navigating a Glidewire past the proximal ureter were made.  Ultimately the Glidewire was able to traverse this narrow area.  The open-ended ureteral catheter was removed.  A 6 French by 24 cm double-J ureteral stent was then placed over the Glidewire and advanced to the kidney with fluoroscopic guidance.  A good curl was seen in the kidney with fluoroscopy and a curl seen in the bladder under direct visualization.  The patient's kidney did not drain well and had retention of contrast following stent placement.  The patient's bladder was decompressed and the cystoscope removed.  She emerged from anesthesia and transferred PACU in stable condition.  Plan:  Discussed with daughter findings that stent appears to be failing. Talked about nephrostomy tube with her daughter but pt has declined in the past.

## 2022-05-06 NOTE — Transfer of Care (Signed)
Immediate Anesthesia Transfer of Care Note  Patient: Laura [name]  Procedure(s) Performed: CYSTOSCOPY WITH RIGHT RETROGRADE PYELOGRAM, AND RIGHT URETERAL STENT EXCHANGE (Right)  Patient Location: PACU  Anesthesia Type:General  Level of Consciousness: awake and alert   Airway & Oxygen Therapy: Patient Spontanous Breathing  Post-op Assessment: Report given to RN and Post -op Vital signs reviewed and stable  Post vital signs: Reviewed  Last Vitals:  Vitals Value Taken Time  BP 170/79 05/06/22 1547  Temp 36.5 C 05/06/22 1547  Pulse 71 05/06/22 1550  Resp 15 05/06/22 1550  SpO2 100 % 05/06/22 1550  Vitals shown include unvalidated device data.  Last Pain:  Vitals:   05/06/22 1547  TempSrc:   PainSc: 5          Complications: No notable events documented.

## 2022-05-07 ENCOUNTER — Encounter (HOSPITAL_COMMUNITY): Payer: Self-pay | Admitting: Urology

## 2022-05-09 ENCOUNTER — Ambulatory Visit: Payer: Self-pay | Admitting: Licensed Clinical Social Worker

## 2022-05-09 NOTE — Patient Instructions (Signed)
Visit Information  Thank you for taking time to visit with me today. Please don't hesitate to contact me if I can be of assistance to you.   Following are the goals we discussed today:   Goals Addressed             This Visit's Progress    Care Coordination Activiites Reduce symptoms of anxiety       Activities and task to complete in order to accomplish goals.   continue with relaxed breathing 3 times daily; this seems to be helping you when you start to feel overwhelmed Keep all upcoming appointment discussed today Continue with compliance of taking medication prescribed by Doctor          Our next appointment is by telephone on 05/26/22 at 10:00  Please call the care guide team at [phone] if you need to cancel or reschedule your appointment.   If you are experiencing a Mental Health or Behavioral Health Crisis or need someone to talk to, please call the Suicide and Crisis Lifeline: 988 call the [place] National Suicide Prevention Lifeline: 517-704-9208 or TTY: 712-867-9259 TTY 770-051-2643) to talk to a trained counselor call 1-800-273-TALK (toll free, 24 hour hotline)   Patient verbalizes understanding of instructions and care plan provided today and agrees to view in MyChart. Active MyChart status and patient understanding of how to access instructions and care plan via MyChart confirmed with patient.     [name], LCSW Social Work Care Coordination  [facility] [employer] [phone]

## 2022-05-09 NOTE — Patient Outreach (Signed)
  Care Coordination  Follow Up Visit Note   05/09/2022 Name: Laura [name] MRN: [id number] DOB: Feb 06, 1945  Laura [name] is a 77 y.o. year old female who sees Corwin Levins, MD for primary care. I spoke with  Laura [name] by phone today.  What matters to the patients health and wellness today?  Understand her health and reducing symptoms of anxiety.    Goals Addressed             This Visit's Progress    Care Coordination Activiites Reduce symptoms of anxiety       Activities and task to complete in order to accomplish goals.   continue with relaxed breathing 3 times daily; this seems to be helping you when you start to feel overwhelmed Keep all upcoming appointment discussed today Continue with compliance of taking medication prescribed by Doctor         SDOH assessments and interventions completed:  No  Care Coordination Interventions:  Yes, provided  Interventions Today    Flowsheet Row Most Recent Value  Chronic Disease   Chronic disease during today's visit Hypertension (HTN), Diabetes, Chronic Kidney Disease/End Stage Renal Disease (ESRD)  General Interventions   Doctor Visits Discussed/Reviewed Specialist, Doctor Visits Reviewed  Theodosia Paling upcoming appointments]  PCP/Specialist Visits Compliance with follow-up visit  [discussed stress related to surgery]  Mental Health Interventions   Mental Health Discussed/Reviewed Mental Health Reviewed, Anxiety, Coping Strategies  [active listening]       Follow up plan: Follow up call scheduled for 05/26/22    Encounter Outcome:  Pt. Visit Completed   [name], LCSW Social Work Care Coordination  [facility] [employer] [employer] [phone]

## 2022-05-13 ENCOUNTER — Other Ambulatory Visit: Payer: 59

## 2022-05-13 ENCOUNTER — Other Ambulatory Visit (HOSPITAL_COMMUNITY): Payer: 59

## 2022-05-14 ENCOUNTER — Ambulatory Visit: Payer: 59 | Admitting: Hematology

## 2022-05-15 ENCOUNTER — Ambulatory Visit: Payer: Self-pay

## 2022-05-15 NOTE — Patient Outreach (Signed)
  Care Coordination   Follow Up Visit Note   05/15/2022 Name: Laura [name] MRN: [id number] DOB: Jun 09, 1945  Laura [name] is a 77 y.o. year old female who sees Corwin Levins, MD for primary care. I spoke with  Laura [name] by phone today.  What matters to the patients health and wellness today?  Reports cystoscopy completed on 05/06/22 and she reports she is doing better. She states the swelling right leg has gone down and the swelling in the left leg is improving. She reports she has an upcoming appointment scheduled to follow up urologist. Ms. Laird expresses she is feeling better. Denies any questions or concerns at this time.  Goals Addressed             This Visit's Progress    Assistance with managing health concerns       Interventions Today    Flowsheet Row Most Recent Value  Chronic Disease   Chronic disease during today's visit Hypertension (HTN), Diabetes, Chronic Kidney Disease/End Stage Renal Disease (ESRD), Other  General Interventions   General Interventions Discussed/Reviewed General Interventions Reviewed, Doctor Visits  Doctor Visits Discussed/Reviewed Doctor Visits Reviewed  PCP/Specialist Visits Compliance with follow-up visit  Exercise Interventions   Exercise Discussed/Reviewed Physical Activity  Education Interventions   Education Provided Provided Education  Provided Verbal Education On When to see the doctor, Medication, Other, Nutrition  [encouraged to continue to eat healthy, take medications as prescribed, attend provider visits as scheduled/recommended, continue to contact provider with health questions/concerns]  Mental Health Interventions   Mental Health Discussed/Reviewed Other  [provided positive feedback regarding self health management]  Pharmacy Interventions   Pharmacy Dicussed/Reviewed Pharmacy Topics Reviewed            SDOH assessments and interventions completed:  No  Care Coordination Interventions:  Yes, provided   Follow up  plan: Follow up call scheduled for 06/19/22    Encounter Outcome:  Pt. Visit Completed   [name], RN, MSN, BSN, CCM [facility] Care Coordinator [phone]

## 2022-05-15 NOTE — Patient Instructions (Signed)
Visit Information  Thank you for taking time to visit with me today. Please don't hesitate to contact me if I can be of assistance to you.   Following are the goals we discussed today:   Goals Addressed             This Visit's Progress    Assistance with managing health concerns       Interventions Today    Flowsheet Row Most Recent Value  Chronic Disease   Chronic disease during today's visit Hypertension (HTN), Diabetes, Chronic Kidney Disease/End Stage Renal Disease (ESRD), Other  General Interventions   General Interventions Discussed/Reviewed General Interventions Reviewed, Doctor Visits  Doctor Visits Discussed/Reviewed Doctor Visits Reviewed  PCP/Specialist Visits Compliance with follow-up visit  Exercise Interventions   Exercise Discussed/Reviewed Physical Activity  Education Interventions   Education Provided Provided Education  Provided Verbal Education On When to see the doctor, Medication, Other, Nutrition  [encouraged to continue to eat healthy, take medications as prescribed, attend provider visits as scheduled/recommended, continue to contact provider with health questions/concerns]  Mental Health Interventions   Mental Health Discussed/Reviewed Other  [provided positive feedback regarding self health management]  Pharmacy Interventions   Pharmacy Dicussed/Reviewed Pharmacy Topics Reviewed            Our next appointment is by telephone on 06/19/22 at 11:00 am  Please call the care guide team at [phone] if you need to cancel or reschedule your appointment.   If you are experiencing a Mental Health or Behavioral Health Crisis or need someone to talk to, please call the Suicide and Crisis Lifeline: 44  [name], RN, MSN, BSN, CCM [facility] Care Coordinator [phone]

## 2022-05-19 ENCOUNTER — Ambulatory Visit: Payer: 59 | Admitting: Hematology

## 2022-05-23 DIAGNOSIS — N130 Hydronephrosis with ureteropelvic junction obstruction: Secondary | ICD-10-CM | POA: Diagnosis not present

## 2022-05-23 DIAGNOSIS — R1031 Right lower quadrant pain: Secondary | ICD-10-CM | POA: Diagnosis not present

## 2022-05-26 ENCOUNTER — Ambulatory Visit: Payer: Self-pay | Admitting: Licensed Clinical Social Worker

## 2022-05-26 NOTE — Patient Instructions (Signed)
Visit Information  Thank you for taking time to visit with me today. Please don't hesitate to contact me if I can be of assistance to you.   Following are the goals we discussed today:   Goals Addressed             This Visit's Progress    Care Coordination Activiites Reduce symptoms of anxiety       Activities and task to complete in order to accomplish goals.   continue with relaxed breathing 3 times daily; this seems to be helping you when you start to feel overwhelmed Keep all upcoming appointment discussed today Continue with compliance of taking medication prescribed by Doctor We will spend more time at our next call with processing your thoughts and emotions          Our next appointment is by telephone on 05/29/22 at 10:00  Please call the care guide team at [phone] if you need to cancel or reschedule your appointment.   If you are experiencing a Mental Health or Behavioral Health Crisis or need someone to talk to, please call the Suicide and Crisis Lifeline: 988 call the [place] National Suicide Prevention Lifeline: 416-747-6921 or TTY: 662-867-0535 TTY (717)317-1746) to talk to a trained counselor call 1-800-273-TALK (toll free, 24 hour hotline)   Patient verbalizes understanding of instructions and care plan provided today and agrees to view in MyChart. Active MyChart status and patient understanding of how to access instructions and care plan via MyChart confirmed with patient.     [name], LCSW Social Work Care Coordination  [facility] [employer] [employer] [phone]

## 2022-05-26 NOTE — Patient Outreach (Signed)
  Care Coordination  Follow Up Visit Note   05/26/2022 Name: Laura [name] MRN: [id number] DOB: 1945/03/04  Laura [name] is a 77 y.o. year old female who sees Corwin Levins, MD for primary care. I spoke with  Laura [name] by phone today.  What matters to the patients health and wellness today?  Understanding her health and how to managing it  Patient is experiencing symptoms of anxiety and depression due to unanswered questions and not fulling understanding her health.  Goals Addressed             This Visit's Progress    Care Coordination Activiites Reduce symptoms of anxiety       Activities and task to complete in order to accomplish goals.   continue with relaxed breathing 3 times daily; this seems to be helping you when you start to feel overwhelmed Keep all upcoming appointment discussed today Continue with compliance of taking medication prescribed by Doctor We will spend more time at our next call with processing your thoughts and emotions         SDOH assessments and interventions completed:  No   Care Coordination Interventions:  Yes, provided  Interventions Today    Flowsheet Row Most Recent Value  Chronic Disease   Chronic disease during today's visit Hypertension (HTN), Diabetes  General Interventions   General Interventions Discussed/Reviewed General Interventions Reviewed  Mental Health Interventions   Mental Health Discussed/Reviewed Mental Health Reviewed, Anxiety, Coping Strategies, Depression  Safety Interventions   Safety Discussed/Reviewed Home Safety       Follow up plan: Follow up call scheduled for 05/29/22    Encounter Outcome:  Pt. Visit Completed   [name], LCSW Social Work Care Coordination  [facility] [employer] [employer] [phone]

## 2022-05-29 ENCOUNTER — Ambulatory Visit: Payer: Self-pay | Admitting: Licensed Clinical Social Worker

## 2022-05-29 NOTE — Patient Instructions (Signed)
Social Work Visit Information  Thank you for taking time to visit with me today. Please don't hesitate to contact me if I can be of assistance to you.   Following are the goals we discussed today:   Goals Addressed             This Visit's Progress    Care Coordination Activiites Reduce symptoms of anxiety related to health concerns       Activities and task to complete in order to accomplish goals.   continue with relaxed breathing 3 times daily; this seems to be helping when you start to feel overwhelmed Keep all upcoming appointment discussed today Continue with compliance of taking medication prescribed by Doctor Keep of log of what's going on with your health so that you can share it with your doctor Call your doctor with any and all new of ongoing symptoms and concerns           Our next appointment is by telephone on 06/25/22 at 10:30   Please call the care guide team at [phone] if you need to cancel or reschedule your appointment.   If you or anyone you know are experiencing a Mental Health or Behavioral Health Crisis or need someone to talk to, please call the Suicide and Crisis Lifeline: 988 call the [place] National Suicide Prevention Lifeline: (931)085-8611 or TTY: (254) 641-8434 TTY 279-057-4012) to talk to a trained counselor call 1-800-273-TALK (toll free, 24 hour hotline) go to [facility] [address] [place] [phone])   Patient verbalizes understanding of instructions and care plan provided today and agrees to view in MyChart. Active MyChart status and patient understanding of how to access instructions and care plan via MyChart confirmed with patient.       Laura Hines, LCSW Social Work Care Coordination  [facility] [employer] [phone]

## 2022-05-29 NOTE — Patient Outreach (Signed)
  Care Coordination  Follow Up Visit Note   05/29/2022 Name: Laura [name] MRN: [id number] DOB: 21-Oct-1945  Laura [name] is a 77 y.o. year old female who sees Corwin Levins, MD for primary care. I spoke with  Laura [name] by phone today.  What matters to the patients health and wellness today?  Understanding her health  Patient continues to experience symptoms of anxiety when she does not understand what is going on with her health.     Goals Addressed             This Visit's Progress    Care Coordination Activiites Reduce symptoms of anxiety related to health concerns       Activities and task to complete in order to accomplish goals.   continue with relaxed breathing 3 times daily; this seems to be helping when you start to feel overwhelmed Keep all upcoming appointment discussed today Continue with compliance of taking medication prescribed by Doctor Keep of log of what's going on with your health so that you can share it with your doctor Call your doctor with any and all new of ongoing symptoms and concerns          SDOH assessments and interventions completed:  No   Care Coordination Interventions:  Yes, provided  Interventions Today    Flowsheet Row Most Recent Value  Chronic Disease   Chronic disease during today's visit Hypertension (HTN), Diabetes, Chronic Kidney Disease/End Stage Renal Disease (ESRD)  General Interventions   General Interventions Discussed/Reviewed General Interventions Reviewed  Doctor Visits Discussed/Reviewed Doctor Visits Reviewed  [reviewed up coming appointments]  Education Interventions   Provided Verbal Education On Mental Health/Coping with Illness, When to see the doctor  Mental Health Interventions   Mental Health Discussed/Reviewed Mental Health Reviewed, Coping Strategies, Anxiety  [solution focused, active listening, problem solving, sleep concerns]       Follow up plan: Follow up call scheduled for 30 days    Encounter  Outcome:  Pt. Visit Completed   [name], LCSW Social Work Care Coordination  [facility] [employer] [employer] [phone]

## 2022-06-03 ENCOUNTER — Ambulatory Visit: Payer: Self-pay | Admitting: Licensed Clinical Social Worker

## 2022-06-03 NOTE — Patient Outreach (Signed)
  Care Coordination  Follow Up Visit Note   06/03/2022 Name: Laura [name] MRN: [id number] DOB: 1945/12/20  Laura [name] is a 77 y.o. year old female who sees Corwin Levins, MD for primary care. I spoke with  [name] by phone today.  What matters to the patients health and wellness today?  Understanding her health and managing her pain    Goals Addressed             This Visit's Progress    Care Coordination Activiites Reduce symptoms of anxiety related to health concerns       Activities and task to complete in order to accomplish goals.   I am sorry you continue to deal with the pain Continue with relaxed breathing 3 times daily; this seems to be helping when you start to feel overwhelmed Keep all upcoming appointment discussed today Continue with compliance of taking medication prescribed by Doctor Keep of log of what's going on with your health so that you can share it with your doctor Call your doctor with any and all new of ongoing symptoms and concerns          SDOH assessments and interventions completed:  No   Care Coordination Interventions:  Yes, provided  Interventions Today    Flowsheet Row Most Recent Value  Chronic Disease   Chronic disease during today's visit Hypertension (HTN), Diabetes  General Interventions   General Interventions Discussed/Reviewed Communication with  Communication with RN  [consultation]  Education Interventions   Provided Verbal Education On When to see the doctor, Mental Health/Coping with Illness  Mental Health Interventions   Mental Health Discussed/Reviewed Mental Health Reviewed, Anxiety, Coping Strategies  [managinge stress ,  solution focused, active listening ,  task centered]       Follow up plan: Follow up call scheduled for 06/25/22    Encounter Outcome:  Pt. Visit Completed   [name], LCSW Social Work Care Coordination  [facility] [employer] [employer] [phone]

## 2022-06-03 NOTE — Patient Instructions (Signed)
Social Work Visit Information  Thank you for taking time to visit with me today. Please don't hesitate to contact me if I can be of assistance to you.   Following are the goals we discussed today:   Goals Addressed             This Visit's Progress    Care Coordination Activiites Reduce symptoms of anxiety related to health concerns       Activities and task to complete in order to accomplish goals.   I am sorry you continue to deal with the pain Continue with relaxed breathing 3 times daily; this seems to be helping when you start to feel overwhelmed Keep all upcoming appointment discussed today Continue with compliance of taking medication prescribed by Doctor Keep of log of what's going on with your health so that you can share it with your doctor Call your doctor with any and all new of ongoing symptoms and concerns          Our next appointment is by telephone on 06/25/22   Please call the care guide team at [phone] if you need to cancel or reschedule your appointment.   If you or anyone you know are experiencing a Mental Health or Behavioral Health Crisis or need someone to talk to, please call the Suicide and Crisis Lifeline: 988 call the [place] National Suicide Prevention Lifeline: (216)790-4548 or TTY: 8453131526 TTY 5046310224) to talk to a trained counselor call 1-800-273-TALK (toll free, 24 hour hotline) go to [facility] [address], [place] [phone])   Patient verbalizes understanding of instructions and care plan provided today and agrees to view in MyChart. Active MyChart status and patient understanding of how to access instructions and care plan via MyChart confirmed with patient.     [name], LCSW Social Work Care Coordination  [facility] [employer] [phone]

## 2022-06-04 ENCOUNTER — Telehealth: Payer: Self-pay | Admitting: Internal Medicine

## 2022-06-04 ENCOUNTER — Other Ambulatory Visit (HOSPITAL_BASED_OUTPATIENT_CLINIC_OR_DEPARTMENT_OTHER): Payer: Self-pay

## 2022-06-04 ENCOUNTER — Encounter: Payer: Self-pay | Admitting: Internal Medicine

## 2022-06-04 ENCOUNTER — Ambulatory Visit (INDEPENDENT_AMBULATORY_CARE_PROVIDER_SITE_OTHER): Payer: 59 | Admitting: Internal Medicine

## 2022-06-04 VITALS — BP 124/72 | HR 80 | Temp 98.7°F | Ht 63.0 in | Wt 184.0 lb

## 2022-06-04 DIAGNOSIS — F5101 Primary insomnia: Secondary | ICD-10-CM | POA: Diagnosis not present

## 2022-06-04 DIAGNOSIS — K5900 Constipation, unspecified: Secondary | ICD-10-CM | POA: Diagnosis not present

## 2022-06-04 DIAGNOSIS — F419 Anxiety disorder, unspecified: Secondary | ICD-10-CM | POA: Diagnosis not present

## 2022-06-04 DIAGNOSIS — R1030 Lower abdominal pain, unspecified: Secondary | ICD-10-CM

## 2022-06-04 MED ORDER — ESZOPICLONE 2 MG PO TABS
2.0000 mg | ORAL_TABLET | Freq: Every evening | ORAL | 1 refills | Status: DC | PRN
  Filled 2022-06-04: qty 90, 90d supply, fill #0

## 2022-06-04 MED ORDER — MIRTAZAPINE 15 MG PO TABS
15.0000 mg | ORAL_TABLET | Freq: Every day | ORAL | 3 refills | Status: DC
  Filled 2022-06-04: qty 90, 90d supply, fill #0

## 2022-06-04 NOTE — Telephone Encounter (Signed)
Patient would like something prescribed for pain - she wants something that she can take during the day too.  Please call patient and advise.  Phone# [phone]

## 2022-06-04 NOTE — Patient Instructions (Addendum)
Ok to try the trulance samples for constipation  If your pain is not improved with this, please let us know as we should likely change the tramadol to oxycontin 10 mg twice per day  Please take all new medication as prescribed - the remeron 15 mg at bedtime for sleep and stress and appetite, also the Lunesta for sleep as needed as well  Please continue all other medications as before, and refills have been done if requested.  Please have the pharmacy call with any other refills you may need.  Please continue your efforts at being more active, low cholesterol diet, and weight control.  Please keep your appointments with your specialists as you may have planned - urology, and oncology as planned  Please make an Appointment to return in June 3 , or sooner if needed

## 2022-06-04 NOTE — Progress Notes (Signed)
Patient ID: Laura [name], female   DOB: 1945-11-10, 77 y.o.   MRN: [id number]        Chief Complaint: follow up pain, sleep difficulty, wt loss anxiety       HPI:  Laura [name] is a 77 y.o. female here with c/o persistent pain and difficutly sleeping -  Pt now S/p 4/23 cysto wit right ureteral stent exchange per urology.  Pt concerned that now the right leg has some swelling when was only the left previously.  Has ongoing pain mostly right abd and side.  Most recent CT abd/pelvis without significant malignancy change but some abnormality still persists.  Has gotten different opinions about what the exact etiology of her pain is.  Now mod to occas severe, lasts for hours, dull and sharp.  Very much interferes with sleep especially with the stress involved.  Tramadol no longer working.  Pt denies chest pain, increased sob or doe, wheezing, orthopnea, PND, increased LE swelling, palpitations, dizziness or syncope.   Pt denies polydipsia, polyuria, or new focal neuro s/s.    Pt denies fever, night sweats, loss of appetite, or other constitutional symptoms  Has lost significant wt, has more anxiety, and overall not doiing as well. Denies urinary symptoms such as dysuria, frequency, urgency, hematuria or n/v, fever, chills.  Denies worsening reflux, dysphagia, n/v, or blood, but endorses constipation ongoing.    Wt Readings from Last 3 Encounters:  06/04/22 184 lb (83.5 kg)  05/06/22 191 lb (86.6 kg)  04/28/22 191 lb (86.6 kg)   BP Readings from Last 3 Encounters:  06/04/22 124/72  05/06/22 (!) 152/74  04/28/22 131/69         Past Medical History:  Diagnosis Date   Anemia    Anxiety    Arthritis    back of neck, bones spurs on neck   Cancer (HCC)    ovarian cancer   Cataract both eyes    Cervical disc disease    Diabetes mellitus Type 2    Family history of adverse reaction to anesthesia    sister slow to awaken   Family history of thyroid cancer    GERD (gastroesophageal reflux disease)     Heart murmur    History of COVID-19 06/2020   pain in side took paxlovid x 5 days all symptoms resolved   History of radiation therapy    abdominal lymph nodes 01/21/2021-02/01/2021  Dr Antony Blackbird   Hydronephrosis 08/24/2020   Right Kidney (stable per CT)   Hyperlipidemia    Hypertension    IBS (irritable bowel syndrome)    Left shoulder frozen with limited rom    Mucoid cyst of joint    right thumb   Neuropathy 03/12/2021   hands/fingers both hands numb and tingle @ times   Port-A-Cath in place 07/21/2019   Reflux    Sleep apnea 03/12/2021   has not used cpap in 3 years   Vertigo 12/2010   none since treated at [facility]   Wears glasses for reading    Wears partial dentures upper    Past Surgical History:  Procedure Laterality Date   BLADDER SURGERY     x 3 at [facility]   BREAST EXCISIONAL BIOPSY Bilateral    BREAST SURGERY     fibroid cyst removed ? over 10 yrs ago at [facility] per pt on 03-12-2021   CHOLECYSTECTOMY     yrs ago   COLONOSCOPY  07/02/2020   and 09-19-2016   CYSTOSCOPY W/  URETERAL STENT PLACEMENT Right 03/13/2021   Procedure: CYSTOSCOPY WITH RETROGRADE PYELOGRAM/URETERAL STENT PLACEMENT;  Surgeon: Noel Christmas, MD;  Location: [facility];  Service: Urology;  Laterality: Right;  30 MINS   CYSTOSCOPY W/ URETERAL STENT PLACEMENT Right 10/01/2021   Procedure: CYSTOSCOPY WITH RETROGRADE PYELOGRAM/URETERAL STENT REPLACEMENT;  Surgeon: Noel Christmas, MD;  Location: [facility];  Service: Urology;  Laterality: Right;   CYSTOSCOPY W/ URETERAL STENT PLACEMENT Bilateral 03/18/2022   Procedure: CYSTOSCOPY WITH BILATERAL RETROGRADE PYELOGRAM/RIGHT URETERAL STENT EXCHANGE;  Surgeon: Noel Christmas, MD;  Location: [facility];  Service: Urology;  Laterality: Bilateral;  1HR   CYSTOSCOPY WITH RETROGRADE PYELOGRAM, URETEROSCOPY AND STENT PLACEMENT Right 05/06/2022   Procedure: CYSTOSCOPY WITH RIGHT RETROGRADE PYELOGRAM, AND RIGHT URETERAL STENT EXCHANGE;   Surgeon: Noel Christmas, MD;  Location: [facility];  Service: Urology;  Laterality: Right;   fibroids removed     breast (both breasts) age 89 and total of 3 surgeries   history of chemotherapy     6 rounds june 2021   IR IMAGING GUIDED PORT INSERTION  07/26/2019   right   MASS EXCISION Right 06/26/2016   Procedure: EXCISION MUCOID TUMOR RIGHT THUMB, IP RIGHT THUMB;  Surgeon: Cindee Salt, MD;  Location: [facility];  Service: Orthopedics;  Laterality: Right;   ROBOTIC ASSISTED BILATERAL SALPINGO OOPHERECTOMY N/A 01/03/2020   Procedure: XI ROBOTIC ASSISTED BILATERAL SALPINGO OOPHORECTOMY, RADICAL TUMOR DEBULKING;  Surgeon: Adolphus Birchwood, MD;  Location: [facility];  Service: Gynecology;  Laterality: N/A;   ROBOTIC PELVIC AND PARA-AORTIC LYMPH NODE DISSECTION N/A 01/03/2020   Procedure: XI ROBOTIC PARA-AORTIC LYMPHADENECTOMY;  Surgeon: Adolphus Birchwood, MD;  Location: [facility];  Service: Gynecology;  Laterality: N/A;   VAGINAL HYSTERECTOMY     age late 89's    reports that she has never smoked. She has never used smokeless tobacco. She reports that she does not drink alcohol and does not use drugs. family history includes Breast cancer in her sister; Diabetes in her father, maternal aunt, and sister; Hypertension in her father, mother, sister, and another family member; Stroke in her sister; Thyroid cancer (age of onset: 60) in her daughter. Allergies  Allergen Reactions   Ciprofloxacin Swelling    Torn tendon   Hydrocodone Bit-Homatrop Mbr Other (See Comments)    Vertigo *pt strongly prefers to never take* took 3 years to recover from   Sulfa Antibiotics Hives, Itching and Swelling    Tongue swells   Alfuzosin Other (See Comments)    Weakness and fatigue   Codeine Itching   Crestor [Rosuvastatin Calcium] Other (See Comments)    Did something to memory     Doxycycline Other (See Comments)    Severe rectal Gas.   Gabapentin     disoriented   Keflex [Cephalexin] Diarrhea and Nausea And  Vomiting   Myrbetriq Theodosia Paling [facility]] Swelling    Swelling of legs and feet   Naproxen Other (See Comments)    Stomach cramps/loud gas   Statins     Muscle weakness   Prednisone Anxiety    *pt strongly prefers to never be given prednisone*    Current Outpatient Medications on File Prior to Visit  Medication Sig Dispense Refill   acetaminophen (TYLENOL) 500 MG tablet Take 1 tablet (500 mg total) by mouth every 6 (six) hours as needed. 30 tablet 0   amLODipine (NORVASC) 5 MG tablet TAKE 1 TABLET (5 MG TOTAL) BY MOUTH DAILY. 90 tablet 3   Bismuth  Subsalicylate (PEPTO-BISMOL MAX STRENGTH PO) Take 10 mLs by mouth every 8 (eight) hours as needed (IBS symptoms).     Blood Glucose Monitoring Suppl (ONE TOUCH ULTRA 2) w/Device KIT Use as directed 1 each 0   Cholecalciferol (VITAMIN D3) 50 MCG (2000 UT) TABS Take 2,000 Units by mouth daily.     Famotidine (PEPCID AC PO) Take 1 tablet by mouth daily as needed (indigestion).     glimepiride (AMARYL) 2 MG tablet Take 0.5 tablets (1 mg total) by mouth daily before breakfast. 1/2 of 2 mg tab 90 tablet 3   glucose blood (ONETOUCH ULTRA) test strip USE TO CHECK BLOOD SUGARS TWO TIMES DAILY 100 strip 3   Lancets (ONETOUCH ULTRASOFT) lancets 1 each by Other route as needed for other. Use as instructed 100 each 3   loratadine (CLARITIN) 10 MG tablet Take 10 mg by mouth daily.     Misc Natural Products (IMMUNE FORMULA PO) Take 3 tablets by mouth daily. Airborne Immune Support     solifenacin (VESICARE) 5 MG tablet Take 1 tablet (5 mg total) by mouth daily. 90 tablet 3   traMADol (ULTRAM) 50 MG tablet Take 1 tablet (50 mg total) by mouth every 8 (eight) hours as needed for severe abdominal pain. 20 tablet 0   vitamin B-12 (CYANOCOBALAMIN) 1000 MCG tablet Take 1,000 mcg by mouth daily.     Wheat Dextrin (BENEFIBER) CHEW Chew 3 tablets by mouth daily.     No current facility-administered medications on file prior to visit.        ROS:  All others reviewed and  negative.  Objective        PE:  BP 124/72 (BP Location: Right Arm, Patient Position: Sitting, Cuff Size: Normal)   Pulse 80   Temp 98.7 F (37.1 C) (Oral)   Ht 5\' 3"  (1.6 m)   Wt 184 lb (83.5 kg)   SpO2 98%   BMI 32.59 kg/m                 Constitutional: Pt appears in NAD               HENT: Head: NCAT.                Right Ear: External ear normal.                 Left Ear: External ear normal.                Eyes: . Pupils are equal, round, and reactive to light. Conjunctivae and EOM are normal               Nose: without d/c or deformity               Neck: Neck supple. Gross normal ROM               Cardiovascular: Normal rate and regular rhythm.                 Pulmonary/Chest: Effort normal and breath sounds without rales or wheezing.                Abd:  Soft,  ND, + BS, no organomegaly, diffuse mild tender right > left               Neurological: Pt is alert. At baseline orientation, motor grossly intact               Skin: Skin is warm.  No rashes, no other new lesions, LE edema - none               Psychiatric: Pt behavior is normal without agitation   Micro: none  Cardiac tracings I have personally interpreted today:  none  Pertinent Radiological findings (summarize): none   Lab Results  Component Value Date   WBC 6.8 04/10/2022   HGB 11.7 (L) 04/10/2022   HCT 36.9 04/10/2022   PLT 214 04/10/2022   GLUCOSE 82 04/10/2022   CHOL 237 (H) 03/06/2021   TRIG 169.0 (H) 03/06/2021   HDL 49.20 03/06/2021   LDLDIRECT 172.0 02/28/2020   LDLCALC 154 (H) 03/06/2021   ALT 11 04/10/2022   AST 18 04/10/2022   NA 135 04/10/2022   K 3.9 04/10/2022   CL 104 04/10/2022   CREATININE 1.35 (H) 04/10/2022   BUN 25 (H) 04/10/2022   CO2 24 04/10/2022   TSH 1.38 03/06/2021   INR 1.0 06/06/2019   HGBA1C 6.0 (H) 04/28/2022   MICROALBUR 2.1 (H) 03/06/2021   Assessment/Plan:  Laura [name] is a 77 y.o. Black or African American [2] female with  has a past medical history of  Anemia, Anxiety, Arthritis, Cancer (HCC), Cataract both eyes, Cervical disc disease, Diabetes mellitus Type 2, Family history of adverse reaction to anesthesia, Family history of thyroid cancer, GERD (gastroesophageal reflux disease), Heart murmur, History of COVID-19 (06/2020), History of radiation therapy, Hydronephrosis (08/24/2020), Hyperlipidemia, Hypertension, IBS (irritable bowel syndrome), Left shoulder frozen with limited rom, Mucoid cyst of joint, Neuropathy (03/12/2021), Port-A-Cath in place (07/21/2019), Reflux, Sleep apnea (03/12/2021), Vertigo (12/2010), Wears glasses for reading, and Wears partial dentures upper.  Constipation With recent worsening, for trulance sample to assess efficacy, for rx if improved  Anxiety With recent wt loss, anxiety, sleep difficulty - for remeron 15 qhs   Insomnia Worsening recent, for lunesta 2 mg qhs prn as this has helped in past  Abdominal pain With recent worsening, recent CT without obvious worsening malignacy, still malignant pain can't be ruled out, but also with recent constipation worsening - for trulance trial first to assess efficacy for constipation, and only then consider other pain control such as narcotic as this can make constipation worse  Followup: Return in about 12 days (around 06/16/2022).  Oliver Barre, MD 06/07/2022 5:20 AM [facility]

## 2022-06-04 NOTE — Telephone Encounter (Signed)
Remember we said to try the trulance first  -   If the pain is from constipation and improved with trulance then the treatment is more laxative, as other narcotic will make it worse  Pt was supposed to let us know if the trulance was not helpful, and then we can consider other pain med such as hydrocodone

## 2022-06-04 NOTE — Telephone Encounter (Signed)
Notified pt w/ MD response. Pt states she forgot abt the trulance but will let him know how it works..Laura [name]

## 2022-06-07 ENCOUNTER — Encounter: Payer: Self-pay | Admitting: Internal Medicine

## 2022-06-07 DIAGNOSIS — K5900 Constipation, unspecified: Secondary | ICD-10-CM | POA: Insufficient documentation

## 2022-06-07 NOTE — Assessment & Plan Note (Signed)
Worsening recent, for lunesta 2 mg qhs prn as this has helped in past

## 2022-06-07 NOTE — Assessment & Plan Note (Signed)
With recent worsening, recent CT without obvious worsening malignacy, still malignant pain can't be ruled out, but also with recent constipation worsening - for trulance trial first to assess efficacy for constipation, and only then consider other pain control such as narcotic as this can make constipation worse

## 2022-06-07 NOTE — Assessment & Plan Note (Signed)
With recent worsening, for trulance sample to assess efficacy, for rx if improved

## 2022-06-07 NOTE — Assessment & Plan Note (Signed)
With recent wt loss, anxiety, sleep difficulty - for remeron 15 qhs

## 2022-06-11 ENCOUNTER — Telehealth: Payer: Self-pay | Admitting: Internal Medicine

## 2022-06-11 NOTE — Telephone Encounter (Signed)
No - again the idea was to work on the constipation first before more narcotics, as narcotics will make constipation worse.  Is the constipation better?

## 2022-06-11 NOTE — Telephone Encounter (Signed)
Patient saw Dr. [name] on 06/04/2022. She said she was told to call if her traMADol (ULTRAM) 50 MG tablet wasn't helping. She would like to know what he would advise. If he is able to send anything else in as a replacement, she would like for it to be sent to [id number] - [place], [place] - [address]. She has an OV scheduled for 06/16/2022. Best callback is [phone].

## 2022-06-12 ENCOUNTER — Encounter (HOSPITAL_COMMUNITY): Payer: Self-pay | Admitting: Hematology

## 2022-06-12 ENCOUNTER — Other Ambulatory Visit: Payer: Self-pay

## 2022-06-12 ENCOUNTER — Other Ambulatory Visit: Payer: Self-pay | Admitting: Internal Medicine

## 2022-06-12 ENCOUNTER — Other Ambulatory Visit (HOSPITAL_BASED_OUTPATIENT_CLINIC_OR_DEPARTMENT_OTHER): Payer: Self-pay

## 2022-06-12 ENCOUNTER — Ambulatory Visit: Payer: Self-pay

## 2022-06-12 MED ORDER — OXYCODONE-ACETAMINOPHEN 5-325 MG PO TABS
1.0000 | ORAL_TABLET | Freq: Four times a day (QID) | ORAL | 0 refills | Status: DC | PRN
  Filled 2022-06-12: qty 30, 8d supply, fill #0

## 2022-06-12 MED ORDER — OXYCODONE-ACETAMINOPHEN 5-325 MG PO TABS: 1.0000 | ORAL_TABLET | Freq: Four times a day (QID) | ORAL | 0 refills | Status: DC | PRN

## 2022-06-12 MED ORDER — OXYCODONE HCL ER 10 MG PO T12A
10.0000 mg | EXTENDED_RELEASE_TABLET | Freq: Two times a day (BID) | ORAL | 0 refills | Status: DC
  Filled 2022-06-12: qty 14, 7d supply, fill #0

## 2022-06-12 NOTE — Patient Outreach (Signed)
  Care Coordination   Follow Up Visit Note   06/12/2022 Name: Laura [name] MRN: [id number] DOB: 1945-07-28  Laura [name] is a 77 y.o. year old female who sees Laura Levins, MD for primary care. I spoke with  Laura [name] by phone today.  What matters to the patients health and wellness today?  "What is causing this pain and why is my leg swelling?" Patient reports continued intermittent pain right groin to back bone and sometimes starts at back and goes to the groin. She states this pain comes and goes and she has it every day. States has contacted PCP to request stronger medication and is awaiting return call today. She reports she has tried pain cream along with Tramadol and hot water bottles, ibuprofen. Reports medications did not help last night adding she was up and restless and in pain. Reports tramadol has started causing her n/v and does not totally get rid of the pain, but only eases the pain. She reports cystoscopy in March 2024-all swelling went out the right leg but still has swelling in left leg. She adds her pain has increased since this procedure. "I feel so helpless". Patient states will discuss Palliative care referral with PCP at office visit next week. Per review of chart:prescription sent to pharmacy for OxyContin per PCP. Laura [name] states she wants to see how she responds to the OxyContin prior to receiving a Palliative care referral. She states she will discuss with PCP.  Goals Addressed             This Visit's Progress    Assistance with managing health concerns       Interventions Today    Flowsheet Row Most Recent Value  Chronic Disease   Chronic disease during today's visit Other  [pain]  General Interventions   General Interventions Discussed/Reviewed General Interventions Reviewed, Doctor Visits  Doctor Visits Discussed/Reviewed Doctor Visits Reviewed  PCP/Specialist Visits Compliance with follow-up visit  [reviewed upcoming appointments. reinforced  importance of attending follow up visit with PCP scheduled for 06/16/22]  Education Interventions   Education Provided Provided Education  Provided Verbal Education On Other, Medication  [discussed Palliaitve care and purpose of palliative care-patient states she will think about it and discuss with PCP at next visit.]  Mental Health Interventions   Mental Health Discussed/Reviewed Coping Strategies, Anxiety  [encouraged to use coping strategies recommended by LCSW]  Pharmacy Interventions   Pharmacy Dicussed/Reviewed Medications and their functions, Medication Adherence, Affording Medications  [encouraged to take medications as recommended. reinforced may take 2-3 weeks to see effects. offered pharmacy referral for medication review/education. patient declines at this time]            SDOH assessments and interventions completed:  No  Care Coordination Interventions:  Yes, provided   Follow up plan: Follow up call scheduled for 07/07/22    Encounter Outcome:  Pt. Visit Completed   [name], RN, MSN, BSN, CCM [facility] Care Coordinator [phone]

## 2022-06-12 NOTE — Patient Instructions (Addendum)
Visit Information  Thank you for taking time to visit with me today. Please don't hesitate to contact me if I can be of assistance to you.   Following are the goals we discussed today:  Continue to take medications as prescribed. Contact your provider if medication questions or concerns Continue to attend provider visits as scheduled/recommended Discuss Palliative Care referral with your PCP at next office visit Eat Healthy Avoid Constipation-see article below   Our next appointment is by telephone on 07/07/22 at 11:00 am  Please call the care guide team at [phone] if you need to cancel or reschedule your appointment.   If you are experiencing a Mental Health or Behavioral Health Crisis or need someone to talk to, please call the Suicide and Crisis Lifeline: 11  [name], RN, MSN, BSN, CCM [facility] Care Coordinator [phone]    Constipation, Adult Constipation is when a person has trouble pooping (having a bowel movement). When you have this condition, you may poop fewer than 3 times a week. Your poop (stool) may also be dry, hard, or bigger than normal. Follow these instructions at home: Eating and drinking  Eat foods that have a lot of fiber, such as: Fresh fruits and vegetables. Whole grains. Beans. Eat less of foods that are low in fiber and high in fat and sugar, such as: [place] fries. Hamburgers. Cookies. Candy. Soda. Drink enough fluid to keep your pee (urine) pale yellow. General instructions Exercise regularly or as told by your doctor. Try to do 150 minutes of exercise each week. Go to the restroom when you feel like you need to poop. Do not hold it in. Take over-the-counter and prescription medicines only as told by your doctor. These include any fiber supplements. When you poop: Do deep breathing while relaxing your lower belly (abdomen). Relax your pelvic floor. The pelvic floor is a group of muscles that support the rectum, bladder, and intestines  (as well as the uterus in women). Watch your condition for any changes. Tell your doctor if you notice any. Keep all follow-up visits as told by your doctor. This is important. Contact a doctor if: You have pain that gets worse. You have a fever. You have not pooped for 4 days. You vomit. You are not hungry. You lose weight. You are bleeding from the opening of the butt (anus). You have thin, pencil-like poop. Get help right away if: You have a fever, and your symptoms suddenly get worse. You leak poop or have blood in your poop. Your belly feels hard or bigger than normal (bloated). You have very bad belly pain. You feel dizzy or you faint. Summary Constipation is when a person poops fewer than 3 times a week, has trouble pooping, or has poop that is dry, hard, or bigger than normal. Eat foods that have a lot of fiber. Drink enough fluid to keep your pee (urine) pale yellow. Take over-the-counter and prescription medicines only as told by your doctor. These include any fiber supplements. This information is not intended to replace advice given to you by your health care provider. Make sure you discuss any questions you have with your health care provider. Document Revised: 11/13/2021 Document Reviewed: 11/13/2021 Elsevier Patient Education  2024 [employer].

## 2022-06-12 NOTE — Addendum Note (Signed)
Addended by: Corwin Levins on: 06/12/2022 11:53 AM   Modules accepted: Orders

## 2022-06-12 NOTE — Telephone Encounter (Signed)
Ok that answers the question of whether improved constipation would help the pain - and apparently no  Please take all new medication as prescribed - the oxycontin low dose for presumed malignant pain  Please let pt know we are only allowed to prescribe 1 wk of medication to start by law  Please ask pt to call in 1 wk for further refill if this helps and she needs further med tx

## 2022-06-13 NOTE — Telephone Encounter (Signed)
Called and let Pt know

## 2022-06-14 ENCOUNTER — Other Ambulatory Visit: Payer: Self-pay

## 2022-06-14 ENCOUNTER — Encounter (HOSPITAL_COMMUNITY): Payer: Self-pay | Admitting: Hematology

## 2022-06-14 ENCOUNTER — Other Ambulatory Visit (HOSPITAL_BASED_OUTPATIENT_CLINIC_OR_DEPARTMENT_OTHER): Payer: Self-pay

## 2022-06-14 ENCOUNTER — Emergency Department (HOSPITAL_BASED_OUTPATIENT_CLINIC_OR_DEPARTMENT_OTHER): Payer: 59

## 2022-06-14 ENCOUNTER — Emergency Department (HOSPITAL_BASED_OUTPATIENT_CLINIC_OR_DEPARTMENT_OTHER)
Admission: EM | Admit: 2022-06-14 | Discharge: 2022-06-14 | Disposition: A | Discharge status: Home / Self Care | Payer: 59 | Source: Home / Self Care | Attending: Emergency Medicine | Admitting: Emergency Medicine

## 2022-06-14 ENCOUNTER — Encounter (HOSPITAL_BASED_OUTPATIENT_CLINIC_OR_DEPARTMENT_OTHER): Payer: Self-pay

## 2022-06-14 DIAGNOSIS — Z8543 Personal history of malignant neoplasm of ovary: Secondary | ICD-10-CM | POA: Insufficient documentation

## 2022-06-14 DIAGNOSIS — Z882 Allergy status to sulfonamides status: Secondary | ICD-10-CM | POA: Diagnosis not present

## 2022-06-14 DIAGNOSIS — R109 Unspecified abdominal pain: Secondary | ICD-10-CM | POA: Insufficient documentation

## 2022-06-14 DIAGNOSIS — I129 Hypertensive chronic kidney disease with stage 1 through stage 4 chronic kidney disease, or unspecified chronic kidney disease: Secondary | ICD-10-CM | POA: Diagnosis not present

## 2022-06-14 DIAGNOSIS — N1832 Chronic kidney disease, stage 3b: Secondary | ICD-10-CM | POA: Diagnosis not present

## 2022-06-14 DIAGNOSIS — Z8616 Personal history of COVID-19: Secondary | ICD-10-CM | POA: Diagnosis not present

## 2022-06-14 DIAGNOSIS — Z9221 Personal history of antineoplastic chemotherapy: Secondary | ICD-10-CM | POA: Diagnosis not present

## 2022-06-14 DIAGNOSIS — Z7984 Long term (current) use of oral hypoglycemic drugs: Secondary | ICD-10-CM | POA: Insufficient documentation

## 2022-06-14 DIAGNOSIS — N1330 Unspecified hydronephrosis: Secondary | ICD-10-CM | POA: Diagnosis not present

## 2022-06-14 DIAGNOSIS — Z79899 Other long term (current) drug therapy: Secondary | ICD-10-CM | POA: Insufficient documentation

## 2022-06-14 DIAGNOSIS — Z886 Allergy status to analgesic agent status: Secondary | ICD-10-CM | POA: Diagnosis not present

## 2022-06-14 DIAGNOSIS — E785 Hyperlipidemia, unspecified: Secondary | ICD-10-CM | POA: Diagnosis not present

## 2022-06-14 DIAGNOSIS — E1140 Type 2 diabetes mellitus with diabetic neuropathy, unspecified: Secondary | ICD-10-CM | POA: Diagnosis not present

## 2022-06-14 DIAGNOSIS — Z9049 Acquired absence of other specified parts of digestive tract: Secondary | ICD-10-CM | POA: Diagnosis not present

## 2022-06-14 DIAGNOSIS — C772 Secondary and unspecified malignant neoplasm of intra-abdominal lymph nodes: Secondary | ICD-10-CM | POA: Diagnosis not present

## 2022-06-14 DIAGNOSIS — I10 Essential (primary) hypertension: Secondary | ICD-10-CM | POA: Insufficient documentation

## 2022-06-14 DIAGNOSIS — G4733 Obstructive sleep apnea (adult) (pediatric): Secondary | ICD-10-CM | POA: Diagnosis not present

## 2022-06-14 DIAGNOSIS — K219 Gastro-esophageal reflux disease without esophagitis: Secondary | ICD-10-CM | POA: Diagnosis not present

## 2022-06-14 DIAGNOSIS — Z794 Long term (current) use of insulin: Secondary | ICD-10-CM | POA: Insufficient documentation

## 2022-06-14 DIAGNOSIS — E119 Type 2 diabetes mellitus without complications: Secondary | ICD-10-CM | POA: Insufficient documentation

## 2022-06-14 DIAGNOSIS — Z885 Allergy status to narcotic agent status: Secondary | ICD-10-CM | POA: Diagnosis not present

## 2022-06-14 DIAGNOSIS — G893 Neoplasm related pain (acute) (chronic): Secondary | ICD-10-CM | POA: Diagnosis not present

## 2022-06-14 DIAGNOSIS — Z881 Allergy status to other antibiotic agents status: Secondary | ICD-10-CM | POA: Diagnosis not present

## 2022-06-14 DIAGNOSIS — M7989 Other specified soft tissue disorders: Secondary | ICD-10-CM | POA: Diagnosis not present

## 2022-06-14 DIAGNOSIS — E1122 Type 2 diabetes mellitus with diabetic chronic kidney disease: Secondary | ICD-10-CM | POA: Diagnosis not present

## 2022-06-14 DIAGNOSIS — Z8249 Family history of ischemic heart disease and other diseases of the circulatory system: Secondary | ICD-10-CM | POA: Diagnosis not present

## 2022-06-14 DIAGNOSIS — R1031 Right lower quadrant pain: Secondary | ICD-10-CM | POA: Diagnosis not present

## 2022-06-14 DIAGNOSIS — Z923 Personal history of irradiation: Secondary | ICD-10-CM | POA: Diagnosis not present

## 2022-06-14 LAB — URINALYSIS, ROUTINE W REFLEX MICROSCOPIC
Bilirubin Urine: NEGATIVE
Glucose, UA: NEGATIVE mg/dL
Hgb urine dipstick: NEGATIVE
Ketones, ur: NEGATIVE mg/dL
Leukocytes,Ua: NEGATIVE
Nitrite: NEGATIVE
Protein, ur: NEGATIVE mg/dL
Specific Gravity, Urine: 1.01 (ref 1.005–1.030)
pH: 6.5 (ref 5.0–8.0)

## 2022-06-14 LAB — CBC
HCT: 37.5 % (ref 36.0–46.0)
Hemoglobin: 12.5 g/dL (ref 12.0–15.0)
MCH: 28.9 pg (ref 26.0–34.0)
MCHC: 33.3 g/dL (ref 30.0–36.0)
MCV: 86.8 fL (ref 80.0–100.0)
Platelets: 215 10*3/uL (ref 150–400)
RBC: 4.32 MIL/uL (ref 3.87–5.11)
RDW: 14.5 % (ref 11.5–15.5)
WBC: 7.6 10*3/uL (ref 4.0–10.5)
nRBC: 0 % (ref 0.0–0.2)

## 2022-06-14 LAB — COMPREHENSIVE METABOLIC PANEL
ALT: 6 U/L (ref 0–44)
AST: 17 U/L (ref 15–41)
Albumin: 4.5 g/dL (ref 3.5–5.0)
Alkaline Phosphatase: 65 U/L (ref 38–126)
Anion gap: 12 (ref 5–15)
BUN: 27 mg/dL — ABNORMAL HIGH (ref 8–23)
CO2: 21 mmol/L — ABNORMAL LOW (ref 22–32)
Calcium: 9.9 mg/dL (ref 8.9–10.3)
Chloride: 103 mmol/L (ref 98–111)
Creatinine, Ser: 1.26 mg/dL — ABNORMAL HIGH (ref 0.44–1.00)
GFR, Estimated: 44 mL/min — ABNORMAL LOW (ref >60)
Glucose, Bld: 152 mg/dL — ABNORMAL HIGH (ref 70–99)
Potassium: 4.1 mmol/L (ref 3.5–5.1)
Sodium: 136 mmol/L (ref 135–145)
Total Bilirubin: 0.3 mg/dL (ref 0.3–1.2)
Total Protein: 7.4 g/dL (ref 6.5–8.1)

## 2022-06-14 LAB — LIPASE, BLOOD: Lipase: <10 U/L — ABNORMAL LOW (ref 11–51)

## 2022-06-14 MED ORDER — IOHEXOL 300 MG/ML  SOLN
100.0000 mL | Freq: Once | INTRAMUSCULAR | Status: AC | PRN
  Administered 2022-06-14: 80 mL via INTRAVENOUS

## 2022-06-14 MED ORDER — KETOROLAC TROMETHAMINE 15 MG/ML IJ SOLN
15.0000 mg | Freq: Once | INTRAMUSCULAR | Status: AC
  Administered 2022-06-14: 15 mg via INTRAVENOUS
  Filled 2022-06-14: qty 1

## 2022-06-14 MED ORDER — FENTANYL CITRATE PF 50 MCG/ML IJ SOSY
50.0000 ug | PREFILLED_SYRINGE | Freq: Once | INTRAMUSCULAR | Status: AC
  Administered 2022-06-14: 50 ug via INTRAVENOUS
  Filled 2022-06-14: qty 1

## 2022-06-14 NOTE — Discharge Instructions (Signed)
Note that your visit to the emergency department today was concerning for spreading of the cancer in your abdomen as well as continued swelling of the right kidney and right ureter.  Recommend calling Dr. Kirtland Bouchard as soon as you are able to set up an appointment outpatient with oncology; he should be alerted by your visit today by the oncologist that I talk to you over the phone.  I also recommend scheduling an appointment with [facility].  Continue with pain medication regimen in the outpatient setting with oxycodone as needed for pain.  If pain becomes severe or unbearable/refractory to medicine, you develop fever, began to vomit or endorse any other symptom we discussed, return to the emergency department.

## 2022-06-14 NOTE — ED Notes (Signed)
Pt resting with family at bedside; advised pain has improved- call light in reach.

## 2022-06-14 NOTE — ED Provider Notes (Signed)
[place] EMERGENCY DEPARTMENT AT [facility] Provider Note   CSN: [id number] Arrival date & time: 06/14/22  1047     History  Chief Complaint  Patient presents with   Back Pain   Pain Management   Flank Pain    Laura [name] is a 77 y.o. female.   Back Pain Flank Pain   77 year old female presents emergency department with complaints of right-sided flank pain.  Patient states that pain has been present since initial stent placed in March of this year.  States that she has been dealing with intermittent right-sided flank pain with radiation to her right inguinal region since then as well as some recently down the anterior aspect of her right thigh to about mid thigh..  States that symptoms have worsened over the past 2 to 3 weeks.  Patient states that she was recently prescribed pain medicine 1 day ago but only had 1 pill yesterday which helped some.  Has had no medication today.  Denies any fever, nausea, vomiting, urinary symptoms, vaginal symptoms, change in bowel habits, chest pain, shortness of breath.  Patient currently not receiving chemo or immunotherapy with most recent infusion in August 2023  Past medical history significant for ovarian cancer, diabetes mellitus, hypertension, hyperlipidemia, IBS, GERD, neuropathy, hydronephrosis  Home Medications Prior to Admission medications   Medication Sig Start Date End Date Taking? Authorizing Provider  acetaminophen (TYLENOL) 500 MG tablet Take 1 tablet (500 mg total) by mouth every 6 (six) hours as needed. 05/16/20   Kerrin Champagne, MD  amLODipine (NORVASC) 5 MG tablet TAKE 1 TABLET (5 MG TOTAL) BY MOUTH DAILY. 11/07/21   Corwin Levins, MD  Bismuth Subsalicylate (PEPTO-BISMOL MAX STRENGTH PO) Take 10 mLs by mouth every 8 (eight) hours as needed (IBS symptoms). 06/13/21   [provider]  Blood Glucose Monitoring Suppl (ONE TOUCH ULTRA 2) w/Device KIT Use as directed 04/06/15   Corwin Levins, MD  Cholecalciferol  (VITAMIN D3) 50 MCG (2000 UT) TABS Take 2,000 Units by mouth daily.    [provider]  eszopiclone (LUNESTA) 2 MG TABS tablet Take 1 tablet (2 mg total) by mouth at bedtime as needed for sleep. Take immediately before bedtime 06/04/22   Corwin Levins, MD  Famotidine (PEPCID AC PO) Take 1 tablet by mouth daily as needed (indigestion).    [provider]  glimepiride (AMARYL) 2 MG tablet Take 0.5 tablets (1 mg total) by mouth daily before breakfast. 1/2 of 2 mg tab 07/08/21   Corwin Levins, MD  glucose blood [facility] ULTRA) test strip USE TO CHECK BLOOD SUGARS TWO TIMES DAILY 03/22/21   Corwin Levins, MD  Lancets [facility] ULTRASOFT) lancets 1 each by Other route as needed for other. Use as instructed 03/20/21   Corwin Levins, MD  loratadine (CLARITIN) 10 MG tablet Take 10 mg by mouth daily.    [provider]  mirtazapine (REMERON) 15 MG tablet Take 1 tablet (15 mg total) by mouth at bedtime. 06/04/22   Corwin Levins, MD  Misc Natural Products (IMMUNE FORMULA PO) Take 3 tablets by mouth daily. Airborne Immune Support    [provider]  oxyCODONE-acetaminophen (PERCOCET/ROXICET) 5-325 MG tablet Take 1 tablet by mouth every 6 (six) hours as needed for up to 10 days for severe pain. 06/12/22 06/22/22  Corwin Levins, MD  solifenacin (VESICARE) 5 MG tablet Take 1 tablet (5 mg total) by mouth daily. 02/14/22   Corwin Levins, MD  traMADol (ULTRAM) 50 MG tablet Take 1 tablet (50 mg total) by mouth every 8 (eight) hours as needed for severe abdominal pain. 04/02/22     vitamin B-12 (CYANOCOBALAMIN) 1000 MCG tablet Take 1,000 mcg by mouth daily.    [provider]  Wheat Dextrin (BENEFIBER) CHEW Chew 3 tablets by mouth daily.    [provider]      Allergies    Ciprofloxacin, Hydrocodone bit-homatrop mbr, Sulfa antibiotics, Alfuzosin, Codeine, Crestor [rosuvastatin calcium], Doxycycline, Gabapentin, Keflex [cephalexin], Myrbetriq [mirabegron er], Naproxen, Statins,  and Prednisone    Review of Systems   Review of Systems  Genitourinary:  Positive for flank pain.  Musculoskeletal:  Positive for back pain.  All other systems reviewed and are negative.   Physical Exam Updated Vital Signs BP (!) 166/80 (BP Location: Left Arm)   Pulse 85   Temp 98.5 F (36.9 C) (Oral)   Resp 20   Ht 5\' 3"  (1.6 m)   Wt 83.5 kg   SpO2 100%   BMI 32.61 kg/m  Physical Exam Vitals and nursing note reviewed.  Constitutional:      General: She is not in acute distress.    Appearance: She is well-developed.  HENT:     Head: Normocephalic and atraumatic.  Eyes:     Conjunctiva/sclera: Conjunctivae normal.  Cardiovascular:     Rate and Rhythm: Normal rate and regular rhythm.  Pulmonary:     Effort: Pulmonary effort is normal. No respiratory distress.     Breath sounds: Normal breath sounds.  Abdominal:     General: There is no distension.     Palpations: Abdomen is soft.     Tenderness: There is abdominal tenderness. There is right CVA tenderness.     Comments: Right lower quadrant tenderness to palpation.  Right-sided CVA tenderness to palpation.  Musculoskeletal:        General: No swelling.     Cervical back: Neck supple.     Comments: Patient with left lower extremity edema.  No midline tenderness of cervical, thoracic, lumbar spine with no obvious palpable reportedly.  Muscular strength symmetric bilateral lower extremities at hip flexion/extension bilaterally, knee flexion/extension bilaterally, ankle dorsi/plantarflexion bilaterally.  Pedal pulses 2+ bilaterally.  No sensory deficits along major nerve distributions of lower extremities.  Skin:    General: Skin is warm and dry.     Capillary Refill: Capillary refill takes less than 2 seconds.  Neurological:     Mental Status: She is alert.  Psychiatric:        Mood and Affect: Mood normal.     ED Results / Procedures / Treatments   Labs (all labs ordered are listed, but only abnormal results are  displayed) Labs Reviewed  LIPASE, BLOOD - Abnormal; Notable for the following components:      Result Value   Lipase <10 (*)    All other components within normal limits  COMPREHENSIVE METABOLIC PANEL - Abnormal; Notable for the following components:   CO2 21 (*)    Glucose, Bld 152 (*)    BUN 27 (*)    Creatinine, Ser 1.26 (*)    GFR, Estimated 44 (*)    All other components within normal limits  URINALYSIS, ROUTINE W REFLEX MICROSCOPIC - Abnormal; Notable for the following components:   Color, Urine COLORLESS (*)    All other components within normal limits  CBC    EKG None  Radiology CT ABDOMEN PELVIS W CONTRAST  Result Date: 06/14/2022 CLINICAL DATA:  Right flank pain EXAM: CT ABDOMEN AND PELVIS WITH CONTRAST TECHNIQUE: Multidetector CT imaging of the abdomen and pelvis was performed using the standard protocol following bolus administration of intravenous contrast. RADIATION DOSE REDUCTION: This exam was performed according to the departmental dose-optimization program which includes automated exposure control, adjustment of the mA and/or kV according to patient size and/or use of iterative reconstruction technique. CONTRAST:  80mL OMNIPAQUE IOHEXOL 300 MG/ML  SOLN COMPARISON:  04/10/2022 FINDINGS: Lower chest: Visualized lower lung fields are clear. Scattered coronary artery calcifications are seen. Hepatobiliary: There are ill-defined foci of decreased density in liver close to bony bodies and falciform ligament, possibly due to aberrant venous drainage or focal fatty infiltration. There is no dilation of bile ducts. Surgical clips are seen in gallbladder fossa. Pancreas: No focal abnormalities are seen. Spleen: Unremarkable. Adrenals/Urinary Tract: Adrenals are unremarkable. There is moderate right hydronephrosis. Right ureteral stent is seen. There is mild left hydronephrosis. There is no wall thickening and urinary bladder lower portion of the urinary bladder is extending below the  level of pubic symphysis suggesting possible cystocele. Stomach/Bowel: Moderate sized fixed hiatal hernia is seen. Small bowel loops are not dilated. Appendix is not distinctly seen. There is no pericecal inflammation. There is no significant wall thickening in colon. Few diverticula are seen in colon without signs of focal acute diverticulitis. Vascular/Lymphatic: Scattered arterial calcifications are seen in aorta and its major branches. There are few enlarged retrocrural lymph nodes measuring up to 9 mm in short axis. There is para-aortic lymph node on the left side slightly below the left renal artery measuring 12 mm in short axis. There is 6.4 by 3.7 cm low-density mass in retroperitoneum at the level of aortic bifurcation suggesting necrotic lymphadenopathy. There is 2.4 x 2.2 cm soft tissue density in the anterior aspect of the right psoas muscle at the level of midportion of right kidney, possibly new lymphadenopathy. Reproductive: Uterus is not seen. No dominant adnexal masses are seen. Other: There is no ascites or pneumoperitoneum. Musculoskeletal: There is first-degree anterolisthesis at the L4-L5 level. Degenerative changes are noted in lumbar spine, more so in the facet joints in lower lumbar spine. There is spinal stenosis and encroachment of neural foramina at L4-L5 level. IMPRESSION: There is no evidence of intestinal obstruction or pneumoperitoneum. There is moderate right hydronephrosis. Right ureteral stent is noted. There is mild left hydronephrosis. There is 6.4 x 3.7 cm low-density lesion in the retroperitoneal location suggesting necrotic lymphadenopathy. There is interval increase in number and size of retroperitoneal lymph nodes suggesting progression of metastatic disease. There are enlarged retrocrural lymph nodes suggesting metastatic lymphadenopathy. Moderate sized fixed hiatal hernia. There are faint areas of decreased density in liver may suggest normal variation due to aberrant  venous drainage or focal fatty infiltration. Lumbar spondylosis. Electronically Signed   By: Ernie Avena M.D.   On: 06/14/2022 12:39    Procedures Procedures    Medications Ordered in ED Medications  ketorolac (TORADOL) 15 MG/ML injection 15 mg (15 mg Intravenous Given 06/14/22 1152)  iohexol (OMNIPAQUE) 300 MG/ML solution 100 mL (80 mLs Intravenous Contrast Given 06/14/22 1201)  fentaNYL (SUBLIMAZE) injection 50 mcg (50 mcg Intravenous Given 06/14/22 1354)    ED Course/ Medical Decision Making/ A&P Clinical Course as of 06/14/22 1853  Sat Jun 14, 2022  1413 Consulted Dr. Candise Che of oncology who recommended admission if pain unable to be controlled with oral pain medication but no intervention deemed necessary at this time from oncologic perspective. [CR]  19 Consulted urology Dr. Laverle Patter regarding the patient who agreed with admission of the patient if unable to have pain controlled by from urology perspective, no intervention deemed necessary at this time given preservation of patient's renal function [CR]  1849 CT ABDOMEN PELVIS W CONTRAST [CR]    Clinical Course User Index [CR] Peter Garter, PA                             Medical Decision Making Amount and/or Complexity of Data Reviewed Labs: ordered. Radiology: ordered.  Risk Prescription drug management.   This patient presents to the ED for concern of flank pain, this involves an extensive number of treatment options, and is a complaint that carries with it a high risk of complications and morbidity.  The differential diagnosis includes pyelonephritis, nephrolithiasis, aortic dissection, aortic aneurysm, malignancy, SBO/LBO, volvulus, appendicitis, diverticulitis, muscular strain/sprain, fracture, dislocation, sciatica, spinal cord impingement   Co morbidities that complicate the patient evaluation  See HPI   Additional history obtained:  Additional history obtained from EMR External records from outside  source obtained and reviewed including hospital records   Lab Tests:  I Ordered, and personally interpreted labs.  The pertinent results include: No leukocytosis noted.  No evidence of anemia.  Platelets within normal range.  Mild decrease in bicarb of 21 but otherwise, electrolytes within normal limits.  Patient with baseline renal dysfunction with BUN of 27, creatinine 1.26 and GFR 44.  No transaminitis.  UA without abnormality.  Lipase within normal limits.   Imaging Studies ordered:  I ordered imaging studies including CT abdomen pelvis I independently visualized and interpreted imaging which showed no evidence of intestinal obstruction or pneumoperitoneum.  Moderate right-sided hydronephrosis.  Right ureteral stent.  Mild left hydronephrosis.  6.4 x 3.7 cm density lesion in the retroperitoneal.  Worsening lymphadenopathy.  Fixed hiatal hernia.  Soft tissue density noted for right psoas muscle I agree with the radiologist interpretation  Cardiac Monitoring: / EKG:  The patient was maintained on a cardiac monitor.  I personally viewed and interpreted the cardiac monitored which showed an underlying rhythm of: Sinus rhythm   Consultations Obtained:  See ED course  Problem List / ED Course / Critical interventions / Medication management  Right flank pain I ordered medication including Toradol, fentanyl  Reevaluation of the patient after these medicines showed that the patient improved I have reviewed the patients home medicines and have made adjustments as needed   Social Determinants of Health:  Denies tobacco, illicit drug use   Test / Admission - Considered:  Right flank pain Vitals signs significant for hypertension blood pressure 166/80. Otherwise within normal range and stable throughout visit. Laboratory/imaging studies significant for: See above 77 year old female presents emergency department with complaints of right-sided flank/abdominal pain.  Patient's pain most  likely secondary to worsening intra-abdominal metastatic disease as well as right-sided hydroureteronephrosis.  Patient with history of metastatic ovarian cancer with what appears to be worsening metastatic disease as well as evidence of persistent right-sided hydroureteronephrosis with stent placement.  Consulted oncology regarding the patient given complex history with worsening metastatic disease who recommended outpatient management of patient's symptoms if able to control pain.  Similarly, consulted urology who recommended the same given that patient has had no worsening renal function from baseline.   Patient's pain controlled while in the emergency department most well with Toradol.  Discussed with patient regarding admission given pain with patient elected for management  of symptoms at home with already prescribed oral pain medications with reassurance of workup thus far. Most recent prescription of 30 oxycodones was prescribed 2 days ago so will refrain from prescribing additional narcotics.  Patient recommended following up with oncology as soon as able for reassessment as well as urology.  Patient overall well-appearing, afebrile in no acute distress.  Treatment plan discussed at length with patient and family and they acknowledge understanding were agreeable to said plan. Worrisome signs and symptoms were discussed with the patient, and the patient acknowledged understanding to return to the ED if noticed. Patient was stable upon discharge.        Final Clinical Impression(s) / ED Diagnoses Final diagnoses:  Right flank pain    Rx / DC Orders ED Discharge Orders     None         Peter Garter, [place] 06/14/22 1854    Ernie Avena, MD 06/15/22 [phone]

## 2022-06-14 NOTE — ED Notes (Signed)
Reviewed AVS/discharge instruction with patient. Time allotted for and all questions answered. Patient is agreeable for d/c and escorted to ed exit by staff.  

## 2022-06-14 NOTE — ED Notes (Signed)
Pt ambulating to bathroom.

## 2022-06-14 NOTE — ED Triage Notes (Signed)
Patient arrives with complaints of lower abdominal/right flank pain. Patient reports that this is an ongoing issue that she has been trying different pain meds prescribed by her PCP with no relief.   Pain is suspected to be related to Urethral stents that she has had replaced more than once.   Rates pain over a 10/10.

## 2022-06-14 NOTE — ED Notes (Signed)
Pt resting, provided a warm blanket for comfort; call light in reach.

## 2022-06-15 ENCOUNTER — Observation Stay (HOSPITAL_COMMUNITY)
Admit: 2022-06-15 | Discharge: 2022-06-15 | Disposition: A | Discharge status: Home / Self Care | Payer: 59 | Attending: Internal Medicine | Admitting: Internal Medicine

## 2022-06-15 ENCOUNTER — Other Ambulatory Visit: Payer: Self-pay

## 2022-06-15 ENCOUNTER — Inpatient Hospital Stay (HOSPITAL_COMMUNITY)
Admission: EM | Admit: 2022-06-15 | Discharge: 2022-06-17 | DRG: 948 | Disposition: A | Discharge status: Home Health | Payer: 59 | Attending: Family Medicine | Admitting: Family Medicine

## 2022-06-15 ENCOUNTER — Encounter (HOSPITAL_COMMUNITY): Payer: Self-pay

## 2022-06-15 DIAGNOSIS — R1031 Right lower quadrant pain: Secondary | ICD-10-CM | POA: Diagnosis not present

## 2022-06-15 DIAGNOSIS — Z7984 Long term (current) use of oral hypoglycemic drugs: Secondary | ICD-10-CM

## 2022-06-15 DIAGNOSIS — Z9221 Personal history of antineoplastic chemotherapy: Secondary | ICD-10-CM

## 2022-06-15 DIAGNOSIS — K219 Gastro-esophageal reflux disease without esophagitis: Secondary | ICD-10-CM | POA: Diagnosis present

## 2022-06-15 DIAGNOSIS — M7989 Other specified soft tissue disorders: Secondary | ICD-10-CM | POA: Diagnosis not present

## 2022-06-15 DIAGNOSIS — I129 Hypertensive chronic kidney disease with stage 1 through stage 4 chronic kidney disease, or unspecified chronic kidney disease: Secondary | ICD-10-CM | POA: Diagnosis present

## 2022-06-15 DIAGNOSIS — Z9079 Acquired absence of other genital organ(s): Secondary | ICD-10-CM

## 2022-06-15 DIAGNOSIS — E669 Obesity, unspecified: Secondary | ICD-10-CM | POA: Diagnosis present

## 2022-06-15 DIAGNOSIS — Z8543 Personal history of malignant neoplasm of ovary: Secondary | ICD-10-CM

## 2022-06-15 DIAGNOSIS — Z923 Personal history of irradiation: Secondary | ICD-10-CM

## 2022-06-15 DIAGNOSIS — C772 Secondary and unspecified malignant neoplasm of intra-abdominal lymph nodes: Secondary | ICD-10-CM | POA: Diagnosis present

## 2022-06-15 DIAGNOSIS — G893 Neoplasm related pain (acute) (chronic): Principal | ICD-10-CM | POA: Diagnosis present

## 2022-06-15 DIAGNOSIS — N1832 Chronic kidney disease, stage 3b: Secondary | ICD-10-CM | POA: Diagnosis present

## 2022-06-15 DIAGNOSIS — R109 Unspecified abdominal pain: Secondary | ICD-10-CM | POA: Diagnosis present

## 2022-06-15 DIAGNOSIS — Z881 Allergy status to other antibiotic agents status: Secondary | ICD-10-CM

## 2022-06-15 DIAGNOSIS — Z6832 Body mass index (BMI) 32.0-32.9, adult: Secondary | ICD-10-CM

## 2022-06-15 DIAGNOSIS — G4733 Obstructive sleep apnea (adult) (pediatric): Secondary | ICD-10-CM | POA: Diagnosis present

## 2022-06-15 DIAGNOSIS — E1140 Type 2 diabetes mellitus with diabetic neuropathy, unspecified: Secondary | ICD-10-CM | POA: Diagnosis present

## 2022-06-15 DIAGNOSIS — Z885 Allergy status to narcotic agent status: Secondary | ICD-10-CM

## 2022-06-15 DIAGNOSIS — Z8616 Personal history of COVID-19: Secondary | ICD-10-CM

## 2022-06-15 DIAGNOSIS — N1330 Unspecified hydronephrosis: Secondary | ICD-10-CM | POA: Diagnosis present

## 2022-06-15 DIAGNOSIS — Z79899 Other long term (current) drug therapy: Secondary | ICD-10-CM

## 2022-06-15 DIAGNOSIS — E785 Hyperlipidemia, unspecified: Secondary | ICD-10-CM | POA: Diagnosis present

## 2022-06-15 DIAGNOSIS — C569 Malignant neoplasm of unspecified ovary: Secondary | ICD-10-CM | POA: Diagnosis present

## 2022-06-15 DIAGNOSIS — Z8249 Family history of ischemic heart disease and other diseases of the circulatory system: Secondary | ICD-10-CM

## 2022-06-15 DIAGNOSIS — Z882 Allergy status to sulfonamides status: Secondary | ICD-10-CM

## 2022-06-15 DIAGNOSIS — E1122 Type 2 diabetes mellitus with diabetic chronic kidney disease: Secondary | ICD-10-CM | POA: Diagnosis present

## 2022-06-15 DIAGNOSIS — E119 Type 2 diabetes mellitus without complications: Secondary | ICD-10-CM

## 2022-06-15 DIAGNOSIS — Z886 Allergy status to analgesic agent status: Secondary | ICD-10-CM

## 2022-06-15 DIAGNOSIS — Z9049 Acquired absence of other specified parts of digestive tract: Secondary | ICD-10-CM

## 2022-06-15 DIAGNOSIS — Z90722 Acquired absence of ovaries, bilateral: Secondary | ICD-10-CM

## 2022-06-15 LAB — URINALYSIS, ROUTINE W REFLEX MICROSCOPIC
Bilirubin Urine: NEGATIVE
Glucose, UA: NEGATIVE mg/dL
Hgb urine dipstick: NEGATIVE
Ketones, ur: NEGATIVE mg/dL
Nitrite: NEGATIVE
Protein, ur: NEGATIVE mg/dL
Specific Gravity, Urine: 1.014 (ref 1.005–1.030)
pH: 5 (ref 5.0–8.0)

## 2022-06-15 LAB — CBC WITH DIFFERENTIAL/PLATELET
Abs Immature Granulocytes: 0.03 10*3/uL (ref 0.00–0.07)
Basophils Absolute: 0 10*3/uL (ref 0.0–0.1)
Basophils Relative: 0 %
Eosinophils Absolute: 0.1 10*3/uL (ref 0.0–0.5)
Eosinophils Relative: 1 %
HCT: 38.3 % (ref 36.0–46.0)
Hemoglobin: 12.2 g/dL (ref 12.0–15.0)
Immature Granulocytes: 0 %
Lymphocytes Relative: 15 %
Lymphs Abs: 1.3 10*3/uL (ref 0.7–4.0)
MCH: 28.4 pg (ref 26.0–34.0)
MCHC: 31.9 g/dL (ref 30.0–36.0)
MCV: 89.1 fL (ref 80.0–100.0)
Monocytes Absolute: 0.5 10*3/uL (ref 0.1–1.0)
Monocytes Relative: 5 %
Neutro Abs: 6.9 10*3/uL (ref 1.7–7.7)
Neutrophils Relative %: 79 %
Platelets: 219 10*3/uL (ref 150–400)
RBC: 4.3 MIL/uL (ref 3.87–5.11)
RDW: 14.5 % (ref 11.5–15.5)
WBC: 8.7 10*3/uL (ref 4.0–10.5)
nRBC: 0 % (ref 0.0–0.2)

## 2022-06-15 LAB — COMPREHENSIVE METABOLIC PANEL
ALT: 10 U/L (ref 0–44)
AST: 21 U/L (ref 15–41)
Albumin: 4.1 g/dL (ref 3.5–5.0)
Alkaline Phosphatase: 67 U/L (ref 38–126)
Anion gap: 10 (ref 5–15)
BUN: 26 mg/dL — ABNORMAL HIGH (ref 8–23)
CO2: 20 mmol/L — ABNORMAL LOW (ref 22–32)
Calcium: 9.4 mg/dL (ref 8.9–10.3)
Chloride: 102 mmol/L (ref 98–111)
Creatinine, Ser: 1.31 mg/dL — ABNORMAL HIGH (ref 0.44–1.00)
GFR, Estimated: 42 mL/min — ABNORMAL LOW (ref >60)
Glucose, Bld: 201 mg/dL — ABNORMAL HIGH (ref 70–99)
Potassium: 4.2 mmol/L (ref 3.5–5.1)
Sodium: 132 mmol/L — ABNORMAL LOW (ref 135–145)
Total Bilirubin: 0.3 mg/dL (ref 0.3–1.2)
Total Protein: 7.7 g/dL (ref 6.5–8.1)

## 2022-06-15 LAB — GLUCOSE, CAPILLARY
Glucose-Capillary: 138 mg/dL — ABNORMAL HIGH (ref 70–99)
Glucose-Capillary: 216 mg/dL — ABNORMAL HIGH (ref 70–99)

## 2022-06-15 LAB — LIPASE, BLOOD: Lipase: 22 U/L (ref 11–51)

## 2022-06-15 MED ORDER — VITAMIN B-12 1000 MCG PO TABS
1000.0000 ug | ORAL_TABLET | Freq: Every day | ORAL | Status: DC
  Administered 2022-06-16 – 2022-06-17 (×2): 1000 ug via ORAL
  Filled 2022-06-15 (×2): qty 1

## 2022-06-15 MED ORDER — KETOROLAC TROMETHAMINE 30 MG/ML IJ SOLN
30.0000 mg | Freq: Once | INTRAMUSCULAR | Status: AC
  Administered 2022-06-15: 30 mg via INTRAVENOUS
  Filled 2022-06-15: qty 1

## 2022-06-15 MED ORDER — ACETAMINOPHEN 650 MG RE SUPP: 650.0000 mg | Freq: Four times a day (QID) | RECTAL | Status: DC | PRN

## 2022-06-15 MED ORDER — MIRTAZAPINE 15 MG PO TABS
15.0000 mg | ORAL_TABLET | Freq: Every day | ORAL | Status: DC
  Administered 2022-06-15 – 2022-06-16 (×2): 15 mg via ORAL
  Filled 2022-06-15 (×2): qty 1

## 2022-06-15 MED ORDER — FAMOTIDINE 20 MG PO TABS
20.0000 mg | ORAL_TABLET | Freq: Every day | ORAL | Status: DC
  Administered 2022-06-15 – 2022-06-16 (×2): 20 mg via ORAL
  Filled 2022-06-15 (×2): qty 1

## 2022-06-15 MED ORDER — OXYCODONE HCL 5 MG PO TABS
5.0000 mg | ORAL_TABLET | ORAL | Status: DC | PRN
  Administered 2022-06-15 (×2): 5 mg via ORAL
  Filled 2022-06-15 (×3): qty 1

## 2022-06-15 MED ORDER — LABETALOL HCL 5 MG/ML IV SOLN
10.0000 mg | Freq: Once | INTRAVENOUS | Status: AC
  Administered 2022-06-15: 10 mg via INTRAVENOUS
  Filled 2022-06-15: qty 4

## 2022-06-15 MED ORDER — ENOXAPARIN SODIUM 40 MG/0.4ML IJ SOSY
40.0000 mg | PREFILLED_SYRINGE | INTRAMUSCULAR | Status: DC
  Administered 2022-06-15 – 2022-06-16 (×2): 40 mg via SUBCUTANEOUS
  Filled 2022-06-15 (×2): qty 0.4

## 2022-06-15 MED ORDER — HYDROMORPHONE HCL 1 MG/ML IJ SOLN
1.0000 mg | Freq: Once | INTRAMUSCULAR | Status: AC
  Administered 2022-06-15: 1 mg via INTRAVENOUS
  Filled 2022-06-15: qty 1

## 2022-06-15 MED ORDER — GLIMEPIRIDE 1 MG PO TABS
1.0000 mg | ORAL_TABLET | Freq: Every day | ORAL | Status: DC
  Administered 2022-06-16: 1 mg via ORAL
  Filled 2022-06-15: qty 1

## 2022-06-15 MED ORDER — AMLODIPINE BESYLATE 5 MG PO TABS
5.0000 mg | ORAL_TABLET | Freq: Every day | ORAL | Status: DC
  Administered 2022-06-15 – 2022-06-17 (×3): 5 mg via ORAL
  Filled 2022-06-15 (×3): qty 1

## 2022-06-15 MED ORDER — INSULIN ASPART 100 UNIT/ML IJ SOLN
0.0000 [IU] | Freq: Every day | INTRAMUSCULAR | Status: DC
  Administered 2022-06-15: 2 [IU] via SUBCUTANEOUS

## 2022-06-15 MED ORDER — ONDANSETRON HCL 4 MG PO TABS: 4.0000 mg | ORAL_TABLET | Freq: Four times a day (QID) | ORAL | Status: DC | PRN

## 2022-06-15 MED ORDER — HYDROMORPHONE HCL 1 MG/ML IJ SOLN
0.5000 mg | INTRAMUSCULAR | Status: DC | PRN
  Administered 2022-06-16 – 2022-06-17 (×2): 0.5 mg via INTRAVENOUS
  Filled 2022-06-15 (×2): qty 0.5

## 2022-06-15 MED ORDER — LORATADINE 10 MG PO TABS
10.0000 mg | ORAL_TABLET | Freq: Every day | ORAL | Status: DC
  Administered 2022-06-16 – 2022-06-17 (×2): 10 mg via ORAL
  Filled 2022-06-15 (×2): qty 1

## 2022-06-15 MED ORDER — INSULIN ASPART 100 UNIT/ML IJ SOLN
0.0000 [IU] | Freq: Three times a day (TID) | INTRAMUSCULAR | Status: DC
  Administered 2022-06-15: 3 [IU] via SUBCUTANEOUS
  Administered 2022-06-16: 4 [IU] via SUBCUTANEOUS
  Administered 2022-06-16: 3 [IU] via SUBCUTANEOUS

## 2022-06-15 MED ORDER — ONDANSETRON 8 MG PO TBDP
8.0000 mg | ORAL_TABLET | Freq: Once | ORAL | Status: AC
  Administered 2022-06-15: 8 mg via ORAL
  Filled 2022-06-15: qty 1

## 2022-06-15 MED ORDER — DEXAMETHASONE SODIUM PHOSPHATE 4 MG/ML IJ SOLN
4.0000 mg | Freq: Two times a day (BID) | INTRAMUSCULAR | Status: DC
  Administered 2022-06-15 – 2022-06-16 (×3): 4 mg via INTRAVENOUS
  Filled 2022-06-15 (×3): qty 1

## 2022-06-15 MED ORDER — ONDANSETRON HCL 4 MG/2ML IJ SOLN: 4.0000 mg | Freq: Four times a day (QID) | INTRAMUSCULAR | Status: DC | PRN

## 2022-06-15 MED ORDER — SODIUM CHLORIDE 0.9 % IV SOLN: INTRAVENOUS | Status: AC

## 2022-06-15 MED ORDER — ACETAMINOPHEN 325 MG PO TABS: 650.0000 mg | ORAL_TABLET | Freq: Four times a day (QID) | ORAL | Status: DC | PRN

## 2022-06-15 NOTE — ED Notes (Signed)
ED TO INPATIENT HANDOFF REPORT  Name/Age/Gender Laura [name] 77 y.o. female  Code Status    Code Status Orders  (From admission, onward)           Start     Ordered   06/15/22 1417  Full code  Continuous       Question:  By:  Answer:  Consent: discussion documented in EHR   06/15/22 1417           Code Status History     Date Active Date Inactive Code Status Order ID Comments User Context   01/03/2020 0818 01/04/2020 1956 Full Code [id number]  Doylene Bode, NP Inpatient   04/03/2019 2032 04/06/2019 0002 Full Code [id number]  Bobette Mo, MD ED   01/05/2011 0014 01/07/2011 1619 Full Code [id number]  Malmfelt, Lise Auer, RN Inpatient       Home/SNF/Other Home  Chief Complaint Intractable abdominal pain [R10.9]  Level of Care/Admitting Diagnosis ED Disposition     ED Disposition  Admit   Condition  --   Comment  Hospital Area: [facility] [[id number]]  Level of Care: Med-Surg [16]  May place patient in observation at [facility] or [facility] if equivalent level of care is available:: No  Covid Evaluation: Asymptomatic - no recent exposure (last 10 days) testing not required  Diagnosis: Intractable abdominal pain [720109]  Admitting Physician: Bobette Mo [[id number]]  Attending Physician: Bobette Mo [[id number]]          Medical History Past Medical History:  Diagnosis Date   Anemia    Anxiety    Arthritis    back of neck, bones spurs on neck   Cancer (HCC)    ovarian cancer   Cataract both eyes    Cervical disc disease    Diabetes mellitus Type 2    Family history of adverse reaction to anesthesia    sister slow to awaken   Family history of thyroid cancer    GERD (gastroesophageal reflux disease)    Heart murmur    History of COVID-19 06/2020   pain in side took paxlovid x 5 days all symptoms resolved   History of radiation therapy    abdominal lymph nodes 01/21/2021-02/01/2021  Dr Antony Blackbird    Hydronephrosis 08/24/2020   Right Kidney (stable per CT)   Hyperlipidemia    Hypertension    IBS (irritable bowel syndrome)    Left shoulder frozen with limited rom    Mucoid cyst of joint    right thumb   Neuropathy 03/12/2021   hands/fingers both hands numb and tingle @ times   Port-A-Cath in place 07/21/2019   Reflux    Sleep apnea 03/12/2021   has not used cpap in 3 years   Vertigo 12/2010   none since treated at [facility]   Wears glasses for reading    Wears partial dentures upper     Allergies Allergies  Allergen Reactions   Ciprofloxacin Swelling    Torn tendon   Hydrocodone Bit-Homatrop Mbr Other (See Comments)    Vertigo *pt strongly prefers to never take* took 3 years to recover from   Sulfa Antibiotics Hives, Itching and Swelling    Tongue swells   Alfuzosin Other (See Comments)    Weakness and fatigue   Codeine Itching   Crestor [Rosuvastatin Calcium] Other (See Comments)    Did something to memory     Doxycycline Other (See Comments)    Severe rectal Gas.  Gabapentin     disoriented   Keflex [Cephalexin] Diarrhea and Nausea And Vomiting   Myrbetriq Theodosia Paling Er] Swelling    Swelling of legs and feet   Naproxen Other (See Comments)    Stomach cramps/loud gas   Statins     Muscle weakness   Prednisone Anxiety    *pt strongly prefers to never be given prednisone*     IV Location/Drains/Wounds Patient Lines/Drains/Airways Status     Active Line/Drains/Airways     Name Placement date Placement time Site Days   Implanted Port 07/26/19 Right Chest 07/26/19  1328  Chest  1055   Peripheral IV 06/15/22 20 G 1" Left Antecubital 06/15/22  1125  Antecubital  less than 1   Ureteral Drain/Stent Right ureter 4.8 Fr. 03/13/21  1228  Right ureter  459   Ureteral Drain/Stent Right ureter 6 Fr. 10/01/21  1441  Right ureter  257   Ureteral Drain/Stent Right ureter 6 Fr. 05/06/22  1522  Right ureter  40            Labs/Imaging Results for orders placed or  performed during the hospital encounter of 06/15/22 (from the past 48 hour(s))  CBC with Differential     Status: None   Collection Time: 06/15/22 12:16 PM  Result Value Ref Range   WBC 8.7 4.0 - 10.5 K/uL   RBC 4.30 3.87 - 5.11 MIL/uL   Hemoglobin 12.2 12.0 - 15.0 g/dL   HCT 40.9 81.1 - 91.4 %   MCV 89.1 80.0 - 100.0 fL   MCH 28.4 26.0 - 34.0 pg   MCHC 31.9 30.0 - 36.0 g/dL   RDW 78.2 95.6 - 21.3 %   Platelets 219 150 - 400 K/uL   nRBC 0.0 0.0 - 0.2 %   Neutrophils Relative % 79 %   Neutro Abs 6.9 1.7 - 7.7 K/uL   Lymphocytes Relative 15 %   Lymphs Abs 1.3 0.7 - 4.0 K/uL   Monocytes Relative 5 %   Monocytes Absolute 0.5 0.1 - 1.0 K/uL   Eosinophils Relative 1 %   Eosinophils Absolute 0.1 0.0 - 0.5 K/uL   Basophils Relative 0 %   Basophils Absolute 0.0 0.0 - 0.1 K/uL   Immature Granulocytes 0 %   Abs Immature Granulocytes 0.03 0.00 - 0.07 K/uL    Comment: Performed at [facility], [address] [place], [place] [address]  Comprehensive metabolic panel     Status: Abnormal   Collection Time: 06/15/22 12:16 PM  Result Value Ref Range   Sodium 132 (L) 135 - 145 mmol/L   Potassium 4.2 3.5 - 5.1 mmol/L   Chloride 102 98 - 111 mmol/L   CO2 20 (L) 22 - 32 mmol/L   Glucose, Bld 201 (H) 70 - 99 mg/dL    Comment: Glucose reference range applies only to samples taken after fasting for at least 8 hours.   BUN 26 (H) 8 - 23 mg/dL   Creatinine, Ser 8.46 (H) 0.44 - 1.00 mg/dL   Calcium 9.4 8.9 - 96.2 mg/dL   Total Protein 7.7 6.5 - 8.1 g/dL   Albumin 4.1 3.5 - 5.0 g/dL   AST 21 15 - 41 U/L   ALT 10 0 - 44 U/L   Alkaline Phosphatase 67 38 - 126 U/L   Total Bilirubin 0.3 0.3 - 1.2 mg/dL   GFR, Estimated 42 (L) >60 mL/min    Comment: (NOTE) Calculated using the CKD-EPI Creatinine Equation (2021)    Anion  gap 10 5 - 15    Comment: Performed at [facility], [address], [place], [place] [address]  Lipase, blood     Status: None   Collection  Time: 06/15/22 12:16 PM  Result Value Ref Range   Lipase 22 11 - 51 U/L    Comment: Performed at [facility], [address], [place], [place] [address]  Urinalysis, Routine w reflex microscopic -Urine, Clean Catch     Status: Abnormal   Collection Time: 06/15/22 12:16 PM  Result Value Ref Range   Color, Urine STRAW (A) YELLOW   APPearance CLEAR CLEAR   Specific Gravity, Urine 1.014 1.005 - 1.030   pH 5.0 5.0 - 8.0   Glucose, UA NEGATIVE NEGATIVE mg/dL   Hgb urine dipstick NEGATIVE NEGATIVE   Bilirubin Urine NEGATIVE NEGATIVE   Ketones, ur NEGATIVE NEGATIVE mg/dL   Protein, ur NEGATIVE NEGATIVE mg/dL   Nitrite NEGATIVE NEGATIVE   Leukocytes,Ua TRACE (A) NEGATIVE   RBC / HPF 0-5 0 - 5 RBC/hpf   WBC, UA 0-5 0 - 5 WBC/hpf   Bacteria, UA RARE (A) NONE SEEN   Squamous Epithelial / HPF 0-5 0 - 5 /HPF   Hyaline Casts, UA PRESENT     Comment: Performed at [facility], [address] [place], [place] [address]   *Note: Due to a large number of results and/or encounters for the requested time period, some results have not been displayed. A complete set of results can be found in Results Review.   CT ABDOMEN PELVIS W CONTRAST  Result Date: 06/14/2022 CLINICAL DATA:  Right flank pain EXAM: CT ABDOMEN AND PELVIS WITH CONTRAST TECHNIQUE: Multidetector CT imaging of the abdomen and pelvis was performed using the standard protocol following bolus administration of intravenous contrast. RADIATION DOSE REDUCTION: This exam was performed according to the departmental dose-optimization program which includes automated exposure control, adjustment of the mA and/or kV according to patient size and/or use of iterative reconstruction technique. CONTRAST:  80mL OMNIPAQUE IOHEXOL 300 MG/ML  SOLN COMPARISON:  04/10/2022 FINDINGS: Lower chest: Visualized lower lung fields are clear. Scattered coronary artery calcifications are seen. Hepatobiliary: There are ill-defined foci  of decreased density in liver close to bony bodies and falciform ligament, possibly due to aberrant venous drainage or focal fatty infiltration. There is no dilation of bile ducts. Surgical clips are seen in gallbladder fossa. Pancreas: No focal abnormalities are seen. Spleen: Unremarkable. Adrenals/Urinary Tract: Adrenals are unremarkable. There is moderate right hydronephrosis. Right ureteral stent is seen. There is mild left hydronephrosis. There is no wall thickening and urinary bladder lower portion of the urinary bladder is extending below the level of pubic symphysis suggesting possible cystocele. Stomach/Bowel: Moderate sized fixed hiatal hernia is seen. Small bowel loops are not dilated. Appendix is not distinctly seen. There is no pericecal inflammation. There is no significant wall thickening in colon. Few diverticula are seen in colon without signs of focal acute diverticulitis. Vascular/Lymphatic: Scattered arterial calcifications are seen in aorta and its major branches. There are few enlarged retrocrural lymph nodes measuring up to 9 mm in short axis. There is para-aortic lymph node on the left side slightly below the left renal artery measuring 12 mm in short axis. There is 6.4 by 3.7 cm low-density mass in retroperitoneum at the level of aortic bifurcation suggesting necrotic lymphadenopathy. There is 2.4 x 2.2 cm soft tissue density in the anterior aspect of the right psoas muscle at the level of midportion  of right kidney, possibly new lymphadenopathy. Reproductive: Uterus is not seen. No dominant adnexal masses are seen. Other: There is no ascites or pneumoperitoneum. Musculoskeletal: There is first-degree anterolisthesis at the L4-L5 level. Degenerative changes are noted in lumbar spine, more so in the facet joints in lower lumbar spine. There is spinal stenosis and encroachment of neural foramina at L4-L5 level. IMPRESSION: There is no evidence of intestinal obstruction or pneumoperitoneum.  There is moderate right hydronephrosis. Right ureteral stent is noted. There is mild left hydronephrosis. There is 6.4 x 3.7 cm low-density lesion in the retroperitoneal location suggesting necrotic lymphadenopathy. There is interval increase in number and size of retroperitoneal lymph nodes suggesting progression of metastatic disease. There are enlarged retrocrural lymph nodes suggesting metastatic lymphadenopathy. Moderate sized fixed hiatal hernia. There are faint areas of decreased density in liver may suggest normal variation due to aberrant venous drainage or focal fatty infiltration. Lumbar spondylosis. Electronically Signed   By: Ernie Avena M.D.   On: 06/14/2022 12:39    Pending Labs Unresulted Labs (From admission, onward)     Start     Ordered   06/16/22 0500  CBC  Tomorrow morning,   R        06/15/22 1417   06/16/22 0500  Basic metabolic panel  Tomorrow morning,   R        06/15/22 1417            Vitals/Pain Today's Vitals   06/15/22 1230 06/15/22 1300 06/15/22 1305 06/15/22 1344  BP: 135/75 (!) 141/101    Pulse: 66 68    Resp: 17 17    Temp:      TempSrc:      SpO2: 99% 100%    Weight:      Height:      PainSc:   5  8     Isolation Precautions No active isolations  Medications Medications  dexamethasone (DECADRON) injection 4 mg (4 mg Intravenous Given 06/15/22 1402)  enoxaparin (LOVENOX) injection 40 mg (has no administration in time range)  acetaminophen (TYLENOL) tablet 650 mg (has no administration in time range)    Or  acetaminophen (TYLENOL) suppository 650 mg (has no administration in time range)  oxyCODONE (Oxy IR/ROXICODONE) immediate release tablet 5 mg (has no administration in time range)  ondansetron (ZOFRAN) tablet 4 mg (has no administration in time range)    Or  ondansetron (ZOFRAN) injection 4 mg (has no administration in time range)  HYDROmorphone (DILAUDID) injection 0.5 mg (has no administration in time range)  ketorolac  (TORADOL) 30 MG/ML injection 30 mg (30 mg Intravenous Given 06/15/22 1151)  ondansetron (ZOFRAN-ODT) disintegrating tablet 8 mg (8 mg Oral Given 06/15/22 1305)  HYDROmorphone (DILAUDID) injection 1 mg (1 mg Intravenous Given 06/15/22 1306)    Mobility walks

## 2022-06-15 NOTE — Plan of Care (Signed)
  Problem: Health Behavior/Discharge Planning: Goal: Ability to manage health-related needs will improve Outcome: Progressing   Problem: Clinical Measurements: Goal: Ability to maintain clinical measurements within normal limits will improve Outcome: Progressing   Problem: Clinical Measurements: Goal: Will remain free from infection Outcome: Progressing   

## 2022-06-15 NOTE — ED Triage Notes (Signed)
Right lower abdominal pain and flank pain that has been ongoing for a few months that got worse over the last 2 weeks. Pt was seen at [facility] yesterday and given the option to be admitted or go home and she chose to go home, now she wants to be admitted due to pain. Pt took home oxycodone and had some relief.  Pt has ovarian cancer and had her last tx in September.

## 2022-06-15 NOTE — H&P (Signed)
History and Physical    Patient: Laura [name] RUE:[id number] DOB: May 22, 1945 DOA: 06/15/2022 DOS: the patient was seen and examined on 06/15/2022 PCP: Corwin Levins, MD  Patient coming from: Home  Chief Complaint:  Chief Complaint  Patient presents with   Flank Pain   HPI: Laura [name] is a 77 y.o. female with medical history significant of unspecified anemia, anxiety, osteoarthritis, cervical disc disease, type 2 diabetes, GERD, COVID-19, right-sided hydronephrosis, status post right ureteral stent placement, mucoid cyst of the right thumb, neuropathy, sleep apnea not using CPAP, vertigo, ovarian cancer, history of radiation therapy who presented to the emergency department with complaints of right lower abdominal and flank pain for the past few months that has been getting progressively worse over the past 2 weeks.  She was seen out [facility] emergency department yesterday and offered to be admitted for pain control, but chose to go home.  However, despite taking oxycodone at home has only had partial relief.  No fever, chills or night sweats. No sore throat, rhinorrhea, dyspnea, wheezing or hemoptysis.  No chest pain, palpitations, diaphoresis, PND, orthopnea or recent pitting edema of the lower extremities.  No diarrhea, constipation, melena or hematochezia.  No  dysuria, frequency or hematuria.  No polyuria, polydipsia, polyphagia or blurred vision.  Lab work: Her urinalysis showed trace leukocyte esterase and rare bacteria.  Her CBC was normal.  Lipase 22 units/L.  CMP showed a corrected to glucose sodium of 134, potassium 4.2, chloride 102 and CO2 20 mmol/L with a normal anion gap.  Glucose 201, BUN 26, creatinine 1.31 mg/dL.  Calcium and hepatic functions were normal.  Imaging: CT abdomen/pelvis with contrast showed no evidence of intestinal obstruction or pneumoperitoneum.  Right ureteral stent with moderate right hydronephrosis.  There is mild left hydronephrosis.  Questionable cystocele.   6.4 x 3.7 cm low-density lesion in the retroperitoneal location suggesting necrotic lymphadenopathy.  There is interval increase in the number and size of retroperitoneal lymph nodes suggesting progression of metastatic disease.  There are enlarged retrocrural lymph nodes suggesting metastatic lymphadenopathy.  Moderate size fixed hiatal hernia.  There are faint areas of decreased density in the liver which may suggest normal variation due to ovarian venous drainage or focal fatty infiltration.  Lumbar spondylosis.   ED course: Initial vital signs were temperature 98.1 F, pulse 72, respirations 16, BP 183/85 mmHg O2 sat 99% on room air.  The patient received ondansetron 8 mg disintegrating tablet, hydromorphone 1 mg IVP x 1 and ketorolac 30 mg IVP x 1.  I added labetalol 10 mg IVP x 1.  The case was discussed with oncology on-call who recommended dexamethasone 4 mg IVP twice a day.  Review of Systems: As mentioned in the history of present illness. All other systems reviewed and are negative. Past Medical History:  Diagnosis Date   Anemia    Anxiety    Arthritis    back of neck, bones spurs on neck   Cancer (HCC)    ovarian cancer   Cataract both eyes    Cervical disc disease    Diabetes mellitus Type 2    Family history of adverse reaction to anesthesia    sister slow to awaken   Family history of thyroid cancer    GERD (gastroesophageal reflux disease)    Heart murmur    History of COVID-19 06/2020   pain in side took paxlovid x 5 days all symptoms resolved   History of radiation therapy    abdominal  lymph nodes 01/21/2021-02/01/2021  Dr Antony Blackbird   Hydronephrosis 08/24/2020   Right Kidney (stable per CT)   Hyperlipidemia    Hypertension    IBS (irritable bowel syndrome)    Left shoulder frozen with limited rom    Mucoid cyst of joint    right thumb   Neuropathy 03/12/2021   hands/fingers both hands numb and tingle @ times   Port-A-Cath in place 07/21/2019   Reflux    Sleep  apnea 03/12/2021   has not used cpap in 3 years   Vertigo 12/2010   none since treated at [facility]   Wears glasses for reading    Wears partial dentures upper    Past Surgical History:  Procedure Laterality Date   BLADDER SURGERY     x 3 at [facility]   BREAST EXCISIONAL BIOPSY Bilateral    BREAST SURGERY     fibroid cyst removed ? over 10 yrs ago at [facility] per pt on 03-12-2021   CHOLECYSTECTOMY     yrs ago   COLONOSCOPY  07/02/2020   and 09-19-2016   CYSTOSCOPY W/ URETERAL STENT PLACEMENT Right 03/13/2021   Procedure: CYSTOSCOPY WITH RETROGRADE PYELOGRAM/URETERAL STENT PLACEMENT;  Surgeon: Noel Christmas, MD;  Location: [facility];  Service: Urology;  Laterality: Right;  30 MINS   CYSTOSCOPY W/ URETERAL STENT PLACEMENT Right 10/01/2021   Procedure: CYSTOSCOPY WITH RETROGRADE PYELOGRAM/URETERAL STENT REPLACEMENT;  Surgeon: Noel Christmas, MD;  Location: [facility];  Service: Urology;  Laterality: Right;   CYSTOSCOPY W/ URETERAL STENT PLACEMENT Bilateral 03/18/2022   Procedure: CYSTOSCOPY WITH BILATERAL RETROGRADE PYELOGRAM/RIGHT URETERAL STENT EXCHANGE;  Surgeon: Noel Christmas, MD;  Location: [facility];  Service: Urology;  Laterality: Bilateral;  1HR   CYSTOSCOPY WITH RETROGRADE PYELOGRAM, URETEROSCOPY AND STENT PLACEMENT Right 05/06/2022   Procedure: CYSTOSCOPY WITH RIGHT RETROGRADE PYELOGRAM, AND RIGHT URETERAL STENT EXCHANGE;  Surgeon: Noel Christmas, MD;  Location: [facility];  Service: Urology;  Laterality: Right;   fibroids removed     breast (both breasts) age 53 and total of 3 surgeries   history of chemotherapy     6 rounds june 2021   IR IMAGING GUIDED PORT INSERTION  07/26/2019   right   MASS EXCISION Right 06/26/2016   Procedure: EXCISION MUCOID TUMOR RIGHT THUMB, IP RIGHT THUMB;  Surgeon: Cindee Salt, MD;  Location: [facility];  Service: Orthopedics;  Laterality: Right;   ROBOTIC ASSISTED BILATERAL SALPINGO OOPHERECTOMY N/A 01/03/2020    Procedure: XI ROBOTIC ASSISTED BILATERAL SALPINGO OOPHORECTOMY, RADICAL TUMOR DEBULKING;  Surgeon: Adolphus Birchwood, MD;  Location: [facility];  Service: Gynecology;  Laterality: N/A;   ROBOTIC PELVIC AND PARA-AORTIC LYMPH NODE DISSECTION N/A 01/03/2020   Procedure: XI ROBOTIC PARA-AORTIC LYMPHADENECTOMY;  Surgeon: Adolphus Birchwood, MD;  Location: [facility];  Service: Gynecology;  Laterality: N/A;   VAGINAL HYSTERECTOMY     age late 54's   Social History:  reports that she has never smoked. She has never used smokeless tobacco. She reports that she does not drink alcohol and does not use drugs.  Allergies  Allergen Reactions   Ciprofloxacin Swelling    Torn tendon   Hydrocodone Bit-Homatrop Mbr Other (See Comments)    Vertigo *pt strongly prefers to never take* took 3 years to recover from   Sulfa Antibiotics Hives, Itching and Swelling    Tongue swells   Alfuzosin Other (See Comments)    Weakness and fatigue   Codeine Itching   Crestor [Rosuvastatin Calcium]  Other (See Comments)    Did something to memory     Doxycycline Other (See Comments)    Severe rectal Gas.   Gabapentin     disoriented   Keflex [Cephalexin] Diarrhea and Nausea And Vomiting   Myrbetriq [facility]] Swelling    Swelling of legs and feet   Naproxen Other (See Comments)    Stomach cramps/loud gas   Statins     Muscle weakness   Prednisone Anxiety    *pt strongly prefers to never be given prednisone*     Family History  Problem Relation Age of Onset   Hypertension Mother    Diabetes Father    Hypertension Father    Diabetes Sister    Breast cancer Sister        stage 0   Hypertension Sister    Stroke Sister    Diabetes Maternal Aunt    Thyroid cancer Daughter 29   Hypertension Other    Colon cancer Neg Hx    Esophageal cancer Neg Hx    Stomach cancer Neg Hx    Rectal cancer Neg Hx    Endometrial cancer Neg Hx    Ovarian cancer Neg Hx     Prior to Admission medications   Medication Sig Start Date End  Date Taking? Authorizing Provider  acetaminophen (TYLENOL) 500 MG tablet Take 1 tablet (500 mg total) by mouth every 6 (six) hours as needed. 05/16/20   Kerrin Champagne, MD  amLODipine (NORVASC) 5 MG tablet TAKE 1 TABLET (5 MG TOTAL) BY MOUTH DAILY. 11/07/21   Corwin Levins, MD  Bismuth Subsalicylate (PEPTO-BISMOL MAX STRENGTH PO) Take 10 mLs by mouth every 8 (eight) hours as needed (IBS symptoms). 06/13/21   [provider]  Blood Glucose Monitoring Suppl (ONE TOUCH ULTRA 2) w/Device KIT Use as directed 04/06/15   Corwin Levins, MD  Cholecalciferol (VITAMIN D3) 50 MCG (2000 UT) TABS Take 2,000 Units by mouth daily.    [provider]  eszopiclone (LUNESTA) 2 MG TABS tablet Take 1 tablet (2 mg total) by mouth at bedtime as needed for sleep. Take immediately before bedtime 06/04/22   Corwin Levins, MD  Famotidine (PEPCID AC PO) Take 1 tablet by mouth daily as needed (indigestion).    [provider]  glimepiride (AMARYL) 2 MG tablet Take 0.5 tablets (1 mg total) by mouth daily before breakfast. 1/2 of 2 mg tab 07/08/21   Corwin Levins, MD  glucose blood [facility] ULTRA) test strip USE TO CHECK BLOOD SUGARS TWO TIMES DAILY 03/22/21   Corwin Levins, MD  Lancets [facility] ULTRASOFT) lancets 1 each by Other route as needed for other. Use as instructed 03/20/21   Corwin Levins, MD  loratadine (CLARITIN) 10 MG tablet Take 10 mg by mouth daily.    [provider]  mirtazapine (REMERON) 15 MG tablet Take 1 tablet (15 mg total) by mouth at bedtime. 06/04/22   Corwin Levins, MD  Misc Natural Products (IMMUNE FORMULA PO) Take 3 tablets by mouth daily. Airborne Immune Support    [provider]  oxyCODONE-acetaminophen (PERCOCET/ROXICET) 5-325 MG tablet Take 1 tablet by mouth every 6 (six) hours as needed for up to 10 days for severe pain. 06/12/22 06/22/22  Corwin Levins, MD  solifenacin (VESICARE) 5 MG tablet Take 1 tablet (5 mg total) by mouth daily. 02/14/22   Corwin Levins, MD   traMADol (ULTRAM) 50 MG tablet Take 1 tablet (50 mg total) by  mouth every 8 (eight) hours as needed for severe abdominal pain. 04/02/22     vitamin B-12 (CYANOCOBALAMIN) 1000 MCG tablet Take 1,000 mcg by mouth daily.    [provider]  Wheat Dextrin (BENEFIBER) CHEW Chew 3 tablets by mouth daily.    [provider]    Physical Exam: Vitals:   06/15/22 1112 06/15/22 1114 06/15/22 1230 06/15/22 1300  BP: (!) 183/85  135/75 (!) 141/101  Pulse: 72  66 68  Resp: 16  17 17   Temp: 98.1 F (36.7 C)     TempSrc: Oral     SpO2: 99%  99% 100%  Weight:  83.5 kg    Height:  5\' 3"  (1.6 m)     Physical Exam Vitals and nursing note reviewed.  Constitutional:      General: She is awake. She is not in acute distress.    Appearance: She is obese.  HENT:     Head: Normocephalic.     Nose: No rhinorrhea.     Mouth/Throat:     Mouth: Mucous membranes are dry.  Eyes:     General: No scleral icterus.    Pupils: Pupils are equal, round, and reactive to light.  Neck:     Vascular: No JVD.  Cardiovascular:     Rate and Rhythm: Normal rate and regular rhythm.     Heart sounds: S1 normal and S2 normal.  Pulmonary:     Effort: Pulmonary effort is normal.     Breath sounds: Normal breath sounds. No wheezing, rhonchi or rales.  Abdominal:     General: Bowel sounds are normal. There is no distension.     Palpations: Abdomen is soft.     Tenderness: There is abdominal tenderness in the right lower quadrant. There is right CVA tenderness. There is no left CVA tenderness, guarding or rebound.  Musculoskeletal:     Cervical back: Neck supple.     Right lower leg: No edema.     Left lower leg: No edema.  Skin:    General: Skin is warm and dry.  Neurological:     General: No focal deficit present.     Mental Status: She is alert and oriented to person, place, and time.  Psychiatric:        Mood and Affect: Mood normal.        Behavior: Behavior normal. Behavior is cooperative.    Data Reviewed:  There are no new results to review at this time.  Assessment and Plan: Principal Problem:   Intractable abdominal pain Likely due to necrotic retroperitoneal adenopathy. In the setting of:   Ovarian cancer (HCC) Observation/MedSurg. Gentle/time-limited IV fluids. Keep n.p.o. for now. Analgesics as needed. Antiemetics as needed. Pantoprazole 40 mg IVP daily. Follow CBC, CMP  in AM. Consider oncology evaluation if no improvement.  Active Problems:   GERD (gastroesophageal reflux disease) Famotidine 20 mg p.o. at bedtime.    Diabetes mellitus type 2, noninsulin dependent (HCC) Hemoglobin A1c of 6.0% last month. Carbohydrate modified diet. Continue glimepiride 2 mg p.o. daily. CBG monitoring with RI SS while on glucocorticoids.    OSA (obstructive sleep apnea) Currently not on CPAP.    Stage 3b chronic kidney disease (CKD) (HCC) Monitor renal function and electrolytes.    Hydronephrosis of right kidney Follow-up with Dr. Arita Miss as scheduled.     Advance Care Planning:   Code Status: Full Code   Consults:   Family Communication: Her brother was at bedside.  Severity of Illness:  The appropriate patient status for this patient is OBSERVATION. Observation status is judged to be reasonable and necessary in order to provide the required intensity of service to ensure the patient's safety. The patient's presenting symptoms, physical exam findings, and initial radiographic and laboratory data in the context of their medical condition is felt to place them at decreased risk for further clinical deterioration. Furthermore, it is anticipated that the patient will be medically stable for discharge from the hospital within 2 midnights of admission.   Author: Bobette Mo, MD 06/15/2022 2:15 PM  For on call review [email or web address].   This document was prepared using Dragon voice recognition software and may contain some unintended transcription errors.

## 2022-06-15 NOTE — ED Provider Notes (Signed)
[place] EMERGENCY DEPARTMENT AT [facility] Provider Note   CSN: [id number] Arrival date & time: 06/15/22  1103     History  Chief Complaint  Patient presents with   Flank Pain   HPI  Laura [name] is a 77 y.o. female with metastatic ovarian cancer, hypertension, diabetes and anemia presenting for abdominal pain. Pain is in the right lower quadrant feels sharp and radiates up to the right back.  Endorses nausea but no vomiting or diarrhea.  Denies urinary changes.  Was seen yesterday for this complaint at [facility] and was found to have moderate right-sided hydronephrosis, lesion in the retroperitoneal space with evidence of worsening lymphadenopathy.  Was discharged and advised to return if pain was worse.  Patient states that last night she could not sleep, attempted to rest and her relying chair with heating pads and took OxyContin every 6 hours with no relief.  Patient is requesting admission for pain control.  Denies fever or chills.   Flank Pain       Home Medications Prior to Admission medications   Medication Sig Start Date End Date Taking? Authorizing Provider  acetaminophen (TYLENOL) 500 MG tablet Take 1 tablet (500 mg total) by mouth every 6 (six) hours as needed. 05/16/20   Kerrin Champagne, MD  amLODipine (NORVASC) 5 MG tablet TAKE 1 TABLET (5 MG TOTAL) BY MOUTH DAILY. 11/07/21   Corwin Levins, MD  Bismuth Subsalicylate (PEPTO-BISMOL MAX STRENGTH PO) Take 10 mLs by mouth every 8 (eight) hours as needed (IBS symptoms). 06/13/21   [provider]  Blood Glucose Monitoring Suppl (ONE TOUCH ULTRA 2) w/Device KIT Use as directed 04/06/15   Corwin Levins, MD  Cholecalciferol (VITAMIN D3) 50 MCG (2000 UT) TABS Take 2,000 Units by mouth daily.    [provider]  eszopiclone (LUNESTA) 2 MG TABS tablet Take 1 tablet (2 mg total) by mouth at bedtime as needed for sleep. Take immediately before bedtime 06/04/22   Corwin Levins, MD  Famotidine (PEPCID AC PO)  Take 1 tablet by mouth daily as needed (indigestion).    [provider]  glimepiride (AMARYL) 2 MG tablet Take 0.5 tablets (1 mg total) by mouth daily before breakfast. 1/2 of 2 mg tab 07/08/21   Corwin Levins, MD  glucose blood [facility] ULTRA) test strip USE TO CHECK BLOOD SUGARS TWO TIMES DAILY 03/22/21   Corwin Levins, MD  Lancets [facility] ULTRASOFT) lancets 1 each by Other route as needed for other. Use as instructed 03/20/21   Corwin Levins, MD  loratadine (CLARITIN) 10 MG tablet Take 10 mg by mouth daily.    [provider]  mirtazapine (REMERON) 15 MG tablet Take 1 tablet (15 mg total) by mouth at bedtime. 06/04/22   Corwin Levins, MD  Misc Natural Products (IMMUNE FORMULA PO) Take 3 tablets by mouth daily. Airborne Immune Support    [provider]  oxyCODONE-acetaminophen (PERCOCET/ROXICET) 5-325 MG tablet Take 1 tablet by mouth every 6 (six) hours as needed for up to 10 days for severe pain. 06/12/22 06/22/22  Corwin Levins, MD  solifenacin (VESICARE) 5 MG tablet Take 1 tablet (5 mg total) by mouth daily. 02/14/22   Corwin Levins, MD  traMADol (ULTRAM) 50 MG tablet Take 1 tablet (50 mg total) by mouth every 8 (eight) hours as needed for severe abdominal pain. 04/02/22     vitamin B-12 (CYANOCOBALAMIN) 1000 MCG tablet Take 1,000 mcg by mouth daily.  [provider]  Wheat Dextrin (BENEFIBER) CHEW Chew 3 tablets by mouth daily.    [provider]      Allergies    Ciprofloxacin, Hydrocodone bit-homatrop mbr, Sulfa antibiotics, Alfuzosin, Codeine, Crestor [rosuvastatin calcium], Doxycycline, Gabapentin, Keflex [cephalexin], Myrbetriq [mirabegron er], Naproxen, Statins, and Prednisone    Review of Systems   Review of Systems  Genitourinary:  Positive for flank pain.    Physical Exam Updated Vital Signs BP (!) 141/101   Pulse 68   Temp 98.1 F (36.7 C) (Oral)   Resp 17   Ht 5\' 3"  (1.6 m)   Wt 83.5 kg   SpO2 100%   BMI 32.61 kg/m  Physical  Exam Vitals and nursing note reviewed.  HENT:     Head: Normocephalic and atraumatic.     Mouth/Throat:     Mouth: Mucous membranes are moist.  Eyes:     General:        Right eye: No discharge.        Left eye: No discharge.     Conjunctiva/sclera: Conjunctivae normal.  Cardiovascular:     Rate and Rhythm: Normal rate and regular rhythm.     Pulses: Normal pulses.     Heart sounds: Normal heart sounds.  Pulmonary:     Effort: Pulmonary effort is normal.     Breath sounds: Normal breath sounds.  Abdominal:     General: Abdomen is flat.     Palpations: Abdomen is soft.     Tenderness: There is abdominal tenderness in the right lower quadrant.  Skin:    General: Skin is warm and dry.  Neurological:     General: No focal deficit present.  Psychiatric:        Mood and Affect: Mood normal.     ED Results / Procedures / Treatments   Labs (all labs ordered are listed, but only abnormal results are displayed) Labs Reviewed  COMPREHENSIVE METABOLIC PANEL - Abnormal; Notable for the following components:      Result Value   Sodium 132 (*)    CO2 20 (*)    Glucose, Bld 201 (*)    BUN 26 (*)    Creatinine, Ser 1.31 (*)    GFR, Estimated 42 (*)    All other components within normal limits  URINALYSIS, ROUTINE W REFLEX MICROSCOPIC - Abnormal; Notable for the following components:   Color, Urine STRAW (*)    Leukocytes,Ua TRACE (*)    Bacteria, UA RARE (*)    All other components within normal limits  CBC WITH DIFFERENTIAL/PLATELET  LIPASE, BLOOD    EKG None  Radiology CT ABDOMEN PELVIS W CONTRAST  Result Date: 06/14/2022 CLINICAL DATA:  Right flank pain EXAM: CT ABDOMEN AND PELVIS WITH CONTRAST TECHNIQUE: Multidetector CT imaging of the abdomen and pelvis was performed using the standard protocol following bolus administration of intravenous contrast. RADIATION DOSE REDUCTION: This exam was performed according to the departmental dose-optimization program which includes  automated exposure control, adjustment of the mA and/or kV according to patient size and/or use of iterative reconstruction technique. CONTRAST:  80mL OMNIPAQUE IOHEXOL 300 MG/ML  SOLN COMPARISON:  04/10/2022 FINDINGS: Lower chest: Visualized lower lung fields are clear. Scattered coronary artery calcifications are seen. Hepatobiliary: There are ill-defined foci of decreased density in liver close to bony bodies and falciform ligament, possibly due to aberrant venous drainage or focal fatty infiltration. There is no dilation of bile ducts. Surgical clips are seen in gallbladder fossa. Pancreas: No focal abnormalities  are seen. Spleen: Unremarkable. Adrenals/Urinary Tract: Adrenals are unremarkable. There is moderate right hydronephrosis. Right ureteral stent is seen. There is mild left hydronephrosis. There is no wall thickening and urinary bladder lower portion of the urinary bladder is extending below the level of pubic symphysis suggesting possible cystocele. Stomach/Bowel: Moderate sized fixed hiatal hernia is seen. Small bowel loops are not dilated. Appendix is not distinctly seen. There is no pericecal inflammation. There is no significant wall thickening in colon. Few diverticula are seen in colon without signs of focal acute diverticulitis. Vascular/Lymphatic: Scattered arterial calcifications are seen in aorta and its major branches. There are few enlarged retrocrural lymph nodes measuring up to 9 mm in short axis. There is para-aortic lymph node on the left side slightly below the left renal artery measuring 12 mm in short axis. There is 6.4 by 3.7 cm low-density mass in retroperitoneum at the level of aortic bifurcation suggesting necrotic lymphadenopathy. There is 2.4 x 2.2 cm soft tissue density in the anterior aspect of the right psoas muscle at the level of midportion of right kidney, possibly new lymphadenopathy. Reproductive: Uterus is not seen. No dominant adnexal masses are seen. Other: There is  no ascites or pneumoperitoneum. Musculoskeletal: There is first-degree anterolisthesis at the L4-L5 level. Degenerative changes are noted in lumbar spine, more so in the facet joints in lower lumbar spine. There is spinal stenosis and encroachment of neural foramina at L4-L5 level. IMPRESSION: There is no evidence of intestinal obstruction or pneumoperitoneum. There is moderate right hydronephrosis. Right ureteral stent is noted. There is mild left hydronephrosis. There is 6.4 x 3.7 cm low-density lesion in the retroperitoneal location suggesting necrotic lymphadenopathy. There is interval increase in number and size of retroperitoneal lymph nodes suggesting progression of metastatic disease. There are enlarged retrocrural lymph nodes suggesting metastatic lymphadenopathy. Moderate sized fixed hiatal hernia. There are faint areas of decreased density in liver may suggest normal variation due to aberrant venous drainage or focal fatty infiltration. Lumbar spondylosis. Electronically Signed   By: Ernie Avena M.D.   On: 06/14/2022 12:39    Procedures Procedures    Medications Ordered in ED Medications  dexamethasone (DECADRON) injection 4 mg (4 mg Intravenous Given 06/15/22 1402)  enoxaparin (LOVENOX) injection 40 mg (has no administration in time range)  acetaminophen (TYLENOL) tablet 650 mg (has no administration in time range)    Or  acetaminophen (TYLENOL) suppository 650 mg (has no administration in time range)  oxyCODONE (Oxy IR/ROXICODONE) immediate release tablet 5 mg (has no administration in time range)  ondansetron (ZOFRAN) tablet 4 mg (has no administration in time range)    Or  ondansetron (ZOFRAN) injection 4 mg (has no administration in time range)  HYDROmorphone (DILAUDID) injection 0.5 mg (has no administration in time range)  ketorolac (TORADOL) 30 MG/ML injection 30 mg (30 mg Intravenous Given 06/15/22 1151)  ondansetron (ZOFRAN-ODT) disintegrating tablet 8 mg (8 mg Oral Given  06/15/22 1305)  HYDROmorphone (DILAUDID) injection 1 mg (1 mg Intravenous Given 06/15/22 1306)    ED Course/ Medical Decision Making/ A&P                             Medical Decision Making Amount and/or Complexity of Data Reviewed Labs: ordered.  Risk Prescription drug management. Decision regarding hospitalization.   77 year old well-appearing female presenting for abdominal pain.  Exam notable for right lower quadrant tenderness.  Was seen for same yesterday, reviewed her CT from yesterday which  revealed progression of her mets.  Spoke to Dr. Candise Che of oncology about her and he suggested that pain could be from necrosis around the lymph nodes causing edema.  He suggested a course of dexamethasone and admission to the hospital for pain control. Started dexamethasone.  Admitted to hospital service for pain management with Dr. Sanda Klein.        Final Clinical Impression(s) / ED Diagnoses Final diagnoses:  Abdominal pain, unspecified abdominal location    Rx / DC Orders ED Discharge Orders     None         Gareth Eagle, PA-C 06/15/22 1431    Pricilla Loveless, MD 06/17/22 [phone]

## 2022-06-15 NOTE — ED Notes (Signed)
Pt requesting pain meds. [name], PA notified

## 2022-06-15 NOTE — Progress Notes (Signed)
Bilateral lower extremity venous duplex has been completed. Preliminary results can be found in CV Proc through chart review.   06/15/22 3:42 PM Olen Cordial RVT

## 2022-06-16 ENCOUNTER — Ambulatory Visit: Payer: 59 | Admitting: Internal Medicine

## 2022-06-16 DIAGNOSIS — K219 Gastro-esophageal reflux disease without esophagitis: Secondary | ICD-10-CM | POA: Diagnosis present

## 2022-06-16 DIAGNOSIS — Z886 Allergy status to analgesic agent status: Secondary | ICD-10-CM | POA: Diagnosis not present

## 2022-06-16 DIAGNOSIS — Z8249 Family history of ischemic heart disease and other diseases of the circulatory system: Secondary | ICD-10-CM | POA: Diagnosis not present

## 2022-06-16 DIAGNOSIS — N1330 Unspecified hydronephrosis: Secondary | ICD-10-CM | POA: Diagnosis present

## 2022-06-16 DIAGNOSIS — E785 Hyperlipidemia, unspecified: Secondary | ICD-10-CM | POA: Diagnosis present

## 2022-06-16 DIAGNOSIS — G4733 Obstructive sleep apnea (adult) (pediatric): Secondary | ICD-10-CM | POA: Diagnosis present

## 2022-06-16 DIAGNOSIS — Z881 Allergy status to other antibiotic agents status: Secondary | ICD-10-CM | POA: Diagnosis not present

## 2022-06-16 DIAGNOSIS — Z882 Allergy status to sulfonamides status: Secondary | ICD-10-CM | POA: Diagnosis not present

## 2022-06-16 DIAGNOSIS — Z6832 Body mass index (BMI) 32.0-32.9, adult: Secondary | ICD-10-CM | POA: Diagnosis not present

## 2022-06-16 DIAGNOSIS — Z8543 Personal history of malignant neoplasm of ovary: Secondary | ICD-10-CM | POA: Diagnosis not present

## 2022-06-16 DIAGNOSIS — Z79899 Other long term (current) drug therapy: Secondary | ICD-10-CM | POA: Diagnosis not present

## 2022-06-16 DIAGNOSIS — C772 Secondary and unspecified malignant neoplasm of intra-abdominal lymph nodes: Secondary | ICD-10-CM | POA: Diagnosis present

## 2022-06-16 DIAGNOSIS — Z8616 Personal history of COVID-19: Secondary | ICD-10-CM | POA: Diagnosis not present

## 2022-06-16 DIAGNOSIS — Z9221 Personal history of antineoplastic chemotherapy: Secondary | ICD-10-CM | POA: Diagnosis not present

## 2022-06-16 DIAGNOSIS — Z9049 Acquired absence of other specified parts of digestive tract: Secondary | ICD-10-CM | POA: Diagnosis not present

## 2022-06-16 DIAGNOSIS — Z923 Personal history of irradiation: Secondary | ICD-10-CM | POA: Diagnosis not present

## 2022-06-16 DIAGNOSIS — E669 Obesity, unspecified: Secondary | ICD-10-CM | POA: Diagnosis present

## 2022-06-16 DIAGNOSIS — Z7984 Long term (current) use of oral hypoglycemic drugs: Secondary | ICD-10-CM | POA: Diagnosis not present

## 2022-06-16 DIAGNOSIS — E1140 Type 2 diabetes mellitus with diabetic neuropathy, unspecified: Secondary | ICD-10-CM | POA: Diagnosis present

## 2022-06-16 DIAGNOSIS — E1122 Type 2 diabetes mellitus with diabetic chronic kidney disease: Secondary | ICD-10-CM | POA: Diagnosis present

## 2022-06-16 DIAGNOSIS — G893 Neoplasm related pain (acute) (chronic): Secondary | ICD-10-CM | POA: Diagnosis present

## 2022-06-16 DIAGNOSIS — Z885 Allergy status to narcotic agent status: Secondary | ICD-10-CM | POA: Diagnosis not present

## 2022-06-16 DIAGNOSIS — R109 Unspecified abdominal pain: Secondary | ICD-10-CM | POA: Diagnosis not present

## 2022-06-16 DIAGNOSIS — I129 Hypertensive chronic kidney disease with stage 1 through stage 4 chronic kidney disease, or unspecified chronic kidney disease: Secondary | ICD-10-CM | POA: Diagnosis present

## 2022-06-16 DIAGNOSIS — N1832 Chronic kidney disease, stage 3b: Secondary | ICD-10-CM | POA: Diagnosis present

## 2022-06-16 LAB — BASIC METABOLIC PANEL
Anion gap: 6 (ref 5–15)
BUN: 27 mg/dL — ABNORMAL HIGH (ref 8–23)
CO2: 20 mmol/L — ABNORMAL LOW (ref 22–32)
Calcium: 9.3 mg/dL (ref 8.9–10.3)
Chloride: 108 mmol/L (ref 98–111)
Creatinine, Ser: 1.23 mg/dL — ABNORMAL HIGH (ref 0.44–1.00)
GFR, Estimated: 45 mL/min — ABNORMAL LOW (ref >60)
Glucose, Bld: 184 mg/dL — ABNORMAL HIGH (ref 70–99)
Potassium: 4.6 mmol/L (ref 3.5–5.1)
Sodium: 134 mmol/L — ABNORMAL LOW (ref 135–145)

## 2022-06-16 LAB — GLUCOSE, CAPILLARY
Glucose-Capillary: 188 mg/dL — ABNORMAL HIGH (ref 70–99)
Glucose-Capillary: 113 mg/dL — ABNORMAL HIGH (ref 70–99)
Glucose-Capillary: 149 mg/dL — ABNORMAL HIGH (ref 70–99)
Glucose-Capillary: 179 mg/dL — ABNORMAL HIGH (ref 70–99)

## 2022-06-16 LAB — CBC
HCT: 33.7 % — ABNORMAL LOW (ref 36.0–46.0)
Hemoglobin: 11 g/dL — ABNORMAL LOW (ref 12.0–15.0)
MCH: 28.9 pg (ref 26.0–34.0)
MCHC: 32.6 g/dL (ref 30.0–36.0)
MCV: 88.5 fL (ref 80.0–100.0)
Platelets: 191 10*3/uL (ref 150–400)
RBC: 3.81 MIL/uL — ABNORMAL LOW (ref 3.87–5.11)
RDW: 14.2 % (ref 11.5–15.5)
WBC: 6 10*3/uL (ref 4.0–10.5)
nRBC: 0 % (ref 0.0–0.2)

## 2022-06-16 MED ORDER — OXYCODONE HCL 5 MG PO TABS
5.0000 mg | ORAL_TABLET | ORAL | Status: DC | PRN
  Administered 2022-06-16 – 2022-06-17 (×6): 5 mg via ORAL
  Filled 2022-06-16 (×4): qty 1

## 2022-06-16 MED ORDER — ACETAMINOPHEN 325 MG PO TABS: 650.0000 mg | ORAL_TABLET | Freq: Four times a day (QID) | ORAL | Status: DC | PRN

## 2022-06-16 MED ORDER — ACETAMINOPHEN 500 MG PO TABS
1000.0000 mg | ORAL_TABLET | Freq: Three times a day (TID) | ORAL | Status: DC
  Administered 2022-06-16 – 2022-06-17 (×4): 1000 mg via ORAL
  Filled 2022-06-16 (×4): qty 2

## 2022-06-16 MED ORDER — OXYCODONE HCL 5 MG PO TABS
10.0000 mg | ORAL_TABLET | ORAL | Status: DC | PRN
  Filled 2022-06-16: qty 2

## 2022-06-16 NOTE — Progress Notes (Signed)
Mobility Specialist - Progress Note   06/16/22 1114  Mobility  Activity Ambulated with assistance in hallway  Level of Assistance Standby assist, set-up cues, supervision of patient - no hands on  Assistive Device None  Distance Ambulated (ft) 440 ft  Range of Motion/Exercises Active  Activity Response Tolerated well  Mobility Referral Yes  $Mobility charge 1 Mobility  Mobility Specialist Start Time (ACUTE ONLY) 1100  Mobility Specialist Stop Time (ACUTE ONLY) 1115  Mobility Specialist Time Calculation (min) (ACUTE ONLY) 15 min   Pt received in bed and agreed to mobility, no issues throughout session, standby for entire session.   Pt returned to chair with all needs met.  Marilynne Halsted Mobility Specialist

## 2022-06-16 NOTE — Progress Notes (Signed)
PROGRESS NOTE    Laura [name]  XWR:[id number] DOB: 1945/04/05 DOA: 06/15/2022 PCP: Corwin Levins, MD  Chief Complaint  Patient presents with   Flank Pain    Brief Narrative:   Laura [name] is Laura [name] 77 y.o. female with medical history significant of unspecified anemia, anxiety, osteoarthritis, cervical disc disease, type 2 diabetes, GERD, COVID-19, right-sided hydronephrosis, status post right ureteral stent placement, mucoid cyst of the right thumb, neuropathy, sleep apnea not using CPAP, vertigo, ovarian cancer, history of radiation therapy who presented to the emergency department with complaints of right lower abdominal and flank pain for the past few months that has been getting progressively worse over the past 2 weeks.  She was seen out [facility] department yesterday and offered to be admitted for pain control, but chose to go home.  However, despite taking oxycodone at home has only had partial relief.  No fever, chills or night sweats. No sore throat, rhinorrhea, dyspnea, wheezing or hemoptysis.  No chest pain, palpitations, diaphoresis, PND, orthopnea or recent pitting edema of the lower extremities.  No diarrhea, constipation, melena or hematochezia.  No  dysuria, frequency or hematuria.  No polyuria, polydipsia, polyphagia or blurred vision.   Lab work: Her urinalysis showed trace leukocyte esterase and rare bacteria.  Her CBC was normal.  Lipase 22 units/L.  CMP showed Laura [name] corrected to glucose sodium of 134, potassium 4.2, chloride 102 and CO2 20 mmol/L with Laura Pettaway normal anion gap.  Glucose 201, BUN 26, creatinine 1.31 mg/dL.  Calcium and hepatic functions were normal.   Imaging: CT abdomen/pelvis with contrast showed no evidence of intestinal obstruction or pneumoperitoneum.  Right ureteral stent with moderate right hydronephrosis.  There is mild left hydronephrosis.  Questionable cystocele.  6.4 x 3.7 cm low-density lesion in the retroperitoneal location suggesting necrotic  lymphadenopathy.  There is interval increase in the number and size of retroperitoneal lymph nodes suggesting progression of metastatic disease.  There are enlarged retrocrural lymph nodes suggesting metastatic lymphadenopathy.  Moderate size fixed hiatal hernia.  There are faint areas of decreased density in the liver which may suggest normal variation due to ovarian venous drainage or focal fatty infiltration.  Lumbar spondylosis.   ED course: Initial vital signs were temperature 98.1 F, pulse 72, respirations 16, BP 183/85 mmHg O2 sat 99% on room air.  The patient received ondansetron 8 mg disintegrating tablet, hydromorphone 1 mg IVP x 1 and ketorolac 30 mg IVP x 1.  I added labetalol 10 mg IVP x 1.  The case was discussed with oncology on-call who recommended dexamethasone 4 mg IVP twice Karysa Heft day.  Assessment & Plan:   Principal Problem:   Intractable abdominal pain Active Problems:   GERD (gastroesophageal reflux disease)   Diabetes mellitus type 2, noninsulin dependent (HCC)   OSA (obstructive sleep apnea)   Abdominal pain   Ovarian cancer (HCC)   Stage 3b chronic kidney disease (CKD) (HCC)   Hydronephrosis of right kidney  Intractable abdominal pain Stage IIIc Ovarian Serous Carcinoma Moderate Right Hydronephrosis s/p Stent Multifactorial abdominal pain related to malignancy Scheduled APAP, oxycodone, morphine for breakthrough Dr. Ellin Saba will follow up after discharge to discuss chemo - will clarify plan for steroids with him Will discuss with Dr. Arita Miss if able, though doubt anything new needed with stable hydronephrosis with stent in place and stable renal function   GERD (gastroesophageal reflux disease) Famotidine 20 mg p.o. at bedtime.   Diabetes mellitus type 2, noninsulin dependent (HCC) Hemoglobin A1c of  6.0% last month. Carbohydrate modified diet. Hold oral hypoglycemics CBG monitoring with RI SS while on glucocorticoids.   OSA (obstructive sleep apnea) Currently  not on CPAP.   Stage 3b chronic kidney disease (CKD) (HCC) Monitor renal function and electrolytes.   Hydronephrosis of right kidney Follow-up with Dr. Arita Miss as scheduled.    DVT prophylaxis: lovenox Code Status: full Family Communication: none Disposition:   Status is: Inpatient Remains inpatient appropriate because: continued need for inpatient care, pain control   Consultants:  Will discuss with Dr. Caren Hazy and Dr. Arita Miss  Procedures:  none  Antimicrobials:  Anti-infectives (From admission, onward)    None       Subjective: C/o continued pain, better than initially, but still present  Objective: Vitals:   06/15/22 1943 06/15/22 2337 06/16/22 0348 06/16/22 1020  BP: (!) 162/67 (!) 141/75 102/89 (!) 157/81  Pulse: 88 82 77 72  Resp: 20 19    Temp: 98.4 F (36.9 C) 98.3 F (36.8 C) 98.1 F (36.7 C)   TempSrc: Oral Oral Oral   SpO2: 94% 95% 95%   Weight:      Height:        Intake/Output Summary (Last 24 hours) at 06/16/2022 1148 Last data filed at 06/16/2022 0000 Gross per 24 hour  Intake 475.18 ml  Output --  Net 475.18 ml   Filed Weights   06/15/22 1114  Weight: 83.5 kg    Examination:  General exam: Appears calm and comfortable  Respiratory system: unlabored Cardiovascular system: RRR Gastrointestinal system: Abdomen is nondistended, soft and nontender Central nervous system: Alert and oriented. No focal neurological deficits. Extremities: no LEE    Data Reviewed: I have personally reviewed following labs and imaging studies  CBC: Recent Labs  Lab 06/14/22 1115 06/15/22 1216 06/16/22 0514  WBC 7.6 8.7 6.0  NEUTROABS  --  6.9  --   HGB 12.5 12.2 11.0*  HCT 37.5 38.3 33.7*  MCV 86.8 89.1 88.5  PLT 215 219 191    Basic Metabolic Panel: Recent Labs  Lab 06/14/22 1115 06/15/22 1216 06/16/22 0514  NA 136 132* 134*  K 4.1 4.2 4.6  CL 103 102 108  CO2 21* 20* 20*  GLUCOSE 152* 201* 184*  BUN 27* 26* 27*  CREATININE 1.26*  1.31* 1.23*  CALCIUM 9.9 9.4 9.3    GFR: Estimated Creatinine Clearance: 39.2 mL/min (Laura [name]) (by C-G formula based on SCr of 1.23 mg/dL (H)).  Liver Function Tests: Recent Labs  Lab 06/14/22 1115 06/15/22 1216  AST 17 21  ALT 6 10  ALKPHOS 65 67  BILITOT 0.3 0.3  PROT 7.4 7.7  ALBUMIN 4.5 4.1    CBG: Recent Labs  Lab 06/15/22 1630 06/15/22 2221 06/16/22 0738 06/16/22 1142  GLUCAP 138* 216* 188* 113*     No results found for this or any previous visit (from the past 240 hour(s)).       Radiology Studies: VAS [place] LOWER EXTREMITY VENOUS (DVT)  Result Date: 06/15/2022  Lower Venous DVT Study Patient Name:  Laura [name]  Date of Exam:   06/15/2022 Medical Rec #: [id number]      Accession #:    [id number] Date of Birth: 1945-09-18      Patient Gender: F Patient Age:   38 years Exam Location:  [facility] Procedure:      VAS [place] LOWER EXTREMITY VENOUS (DVT) Referring Phys: DAVID ORTIZ --------------------------------------------------------------------------------  Indications: Swelling.  Risk Factors: Cancer. Limitations: Poor ultrasound/tissue interface. Comparison Study:  No prior studies. Performing Technologist: Laura [name] RVT  Examination Guidelines: Laura [name] complete evaluation includes B-mode imaging, spectral Doppler, color Doppler, and power Doppler as needed of all accessible portions of each vessel. Bilateral testing is considered an integral part of Laura [name] complete examination. Limited examinations for reoccurring indications may be performed as noted. The reflux portion of the exam is performed with the patient in reverse Trendelenburg.  +---------+---------------+---------+-----------+----------+--------------+ RIGHT    CompressibilityPhasicitySpontaneityPropertiesThrombus Aging +---------+---------------+---------+-----------+----------+--------------+ CFV      Full           Yes      Yes                                  +---------+---------------+---------+-----------+----------+--------------+ SFJ      Full                                                        +---------+---------------+---------+-----------+----------+--------------+ FV Prox  Full                                                        +---------+---------------+---------+-----------+----------+--------------+ FV Mid   Full                                                        +---------+---------------+---------+-----------+----------+--------------+ FV DistalFull                                                        +---------+---------------+---------+-----------+----------+--------------+ PFV      Full                                                        +---------+---------------+---------+-----------+----------+--------------+ POP      Full           Yes      Yes                                 +---------+---------------+---------+-----------+----------+--------------+ PTV      Full                                                        +---------+---------------+---------+-----------+----------+--------------+ PERO     Full                                                        +---------+---------------+---------+-----------+----------+--------------+   +---------+---------------+---------+-----------+----------+--------------+  LEFT     CompressibilityPhasicitySpontaneityPropertiesThrombus Aging +---------+---------------+---------+-----------+----------+--------------+ CFV      Full           Yes      Yes                                 +---------+---------------+---------+-----------+----------+--------------+ SFJ      Full                                                        +---------+---------------+---------+-----------+----------+--------------+ FV Prox  Full                                                         +---------+---------------+---------+-----------+----------+--------------+ FV Mid   Full           Yes      Yes                                 +---------+---------------+---------+-----------+----------+--------------+ FV Distal               Yes      Yes                                 +---------+---------------+---------+-----------+----------+--------------+ PFV      Full                                                        +---------+---------------+---------+-----------+----------+--------------+ POP      Full           Yes      Yes                                 +---------+---------------+---------+-----------+----------+--------------+ PTV      Full                                                        +---------+---------------+---------+-----------+----------+--------------+ PERO     Full                                                        +---------+---------------+---------+-----------+----------+--------------+    Summary: RIGHT: - There is no evidence of deep vein thrombosis in the lower extremity. However, portions of this examination were limited- see technologist comments above.  - No cystic structure found in the popliteal fossa.  LEFT: - There is no evidence of deep vein thrombosis in the lower extremity. However,  portions of this examination were limited- see technologist comments above.  - No cystic structure found in the popliteal fossa.  *See table(s) above for measurements and observations.    Preliminary    CT ABDOMEN PELVIS W CONTRAST  Result Date: 06/14/2022 CLINICAL DATA:  Right flank pain EXAM: CT ABDOMEN AND PELVIS WITH CONTRAST TECHNIQUE: Multidetector CT imaging of the abdomen and pelvis was performed using the standard protocol following bolus administration of intravenous contrast. RADIATION DOSE REDUCTION: This exam was performed according to the departmental dose-optimization program which includes automated exposure control,  adjustment of the mA and/or kV according to patient size and/or use of iterative reconstruction technique. CONTRAST:  80mL OMNIPAQUE IOHEXOL 300 MG/ML  SOLN COMPARISON:  04/10/2022 FINDINGS: Lower chest: Visualized lower lung fields are clear. Scattered coronary artery calcifications are seen. Hepatobiliary: There are ill-defined foci of decreased density in liver close to bony bodies and falciform ligament, possibly due to aberrant venous drainage or focal fatty infiltration. There is no dilation of bile ducts. Surgical clips are seen in gallbladder fossa. Pancreas: No focal abnormalities are seen. Spleen: Unremarkable. Adrenals/Urinary Tract: Adrenals are unremarkable. There is moderate right hydronephrosis. Right ureteral stent is seen. There is mild left hydronephrosis. There is no wall thickening and urinary bladder lower portion of the urinary bladder is extending below the level of pubic symphysis suggesting possible cystocele. Stomach/Bowel: Moderate sized fixed hiatal hernia is seen. Small bowel loops are not dilated. Appendix is not distinctly seen. There is no pericecal inflammation. There is no significant wall thickening in colon. Few diverticula are seen in colon without signs of focal acute diverticulitis. Vascular/Lymphatic: Scattered arterial calcifications are seen in aorta and its major branches. There are few enlarged retrocrural lymph nodes measuring up to 9 mm in short axis. There is para-aortic lymph node on the left side slightly below the left renal artery measuring 12 mm in short axis. There is 6.4 by 3.7 cm low-density mass in retroperitoneum at the level of aortic bifurcation suggesting necrotic lymphadenopathy. There is 2.4 x 2.2 cm soft tissue density in the anterior aspect of the right psoas muscle at the level of midportion of right kidney, possibly new lymphadenopathy. Reproductive: Uterus is not seen. No dominant adnexal masses are seen. Other: There is no ascites or  pneumoperitoneum. Musculoskeletal: There is first-degree anterolisthesis at the L4-L5 level. Degenerative changes are noted in lumbar spine, more so in the facet joints in lower lumbar spine. There is spinal stenosis and encroachment of neural foramina at L4-L5 level. IMPRESSION: There is no evidence of intestinal obstruction or pneumoperitoneum. There is moderate right hydronephrosis. Right ureteral stent is noted. There is mild left hydronephrosis. There is 6.4 x 3.7 cm low-density lesion in the retroperitoneal location suggesting necrotic lymphadenopathy. There is interval increase in number and size of retroperitoneal lymph nodes suggesting progression of metastatic disease. There are enlarged retrocrural lymph nodes suggesting metastatic lymphadenopathy. Moderate sized fixed hiatal hernia. There are faint areas of decreased density in liver may suggest normal variation due to aberrant venous drainage or focal fatty infiltration. Lumbar spondylosis. Electronically Signed   By: Ernie Avena M.D.   On: 06/14/2022 12:39        Scheduled Meds:  acetaminophen  1,000 mg Oral Q8H   amLODipine  5 mg Oral Daily   cyanocobalamin  1,000 mcg Oral Daily   dexamethasone (DECADRON) injection  4 mg Intravenous BID   enoxaparin (LOVENOX) injection  40 mg Subcutaneous Q24H   famotidine  20 mg Oral QHS  insulin aspart  0-20 Units Subcutaneous TID WC   insulin aspart  0-5 Units Subcutaneous QHS   loratadine  10 mg Oral Daily   mirtazapine  15 mg Oral QHS   Continuous Infusions:   LOS: 0 days    Time spent: over 30 min    Lacretia Nicks, MD [facility]   To contact the attending provider between 7A-7P or the covering provider during after hours 7P-7A, please log into the web site [email or web address] and access using universal [place] password for that web site. If you do not have the password, please call the hospital operator.  06/16/2022, 11:48 AM

## 2022-06-16 NOTE — TOC CM/SW Note (Signed)
Transition of Care [facility]) - Inpatient Brief Assessment   Patient Details  Name: Laura [name] MRN: [id number] Date of Birth: 06-24-1945  Transition of Care [facility]) CM/SW Contact:    Otelia Santee, LCSW Phone Number: 06/16/2022, 11:54 AM   Clinical Narrative: Chart reviewed. No TOC needs identified at this time. Please consult should TOC need arise.    Transition of Care Asessment: Insurance and Status: Insurance coverage has been reviewed Patient has primary care physician: Yes Home environment has been reviewed: Single Family Home. Prior level of function:: Independent Prior/Current Home Services: No current home services Social Determinants of Health Reivew: SDOH reviewed no interventions necessary Readmission risk has been reviewed: No (NA) Transition of care needs: no transition of care needs at this time

## 2022-06-17 ENCOUNTER — Other Ambulatory Visit (HOSPITAL_BASED_OUTPATIENT_CLINIC_OR_DEPARTMENT_OTHER): Payer: Self-pay

## 2022-06-17 ENCOUNTER — Encounter (HOSPITAL_COMMUNITY): Payer: Self-pay | Admitting: Hematology

## 2022-06-17 DIAGNOSIS — R109 Unspecified abdominal pain: Secondary | ICD-10-CM | POA: Diagnosis not present

## 2022-06-17 LAB — BASIC METABOLIC PANEL
Anion gap: 7 (ref 5–15)
BUN: 34 mg/dL — ABNORMAL HIGH (ref 8–23)
CO2: 25 mmol/L (ref 22–32)
Calcium: 10.3 mg/dL (ref 8.9–10.3)
Chloride: 106 mmol/L (ref 98–111)
Creatinine, Ser: 1.34 mg/dL — ABNORMAL HIGH (ref 0.44–1.00)
GFR, Estimated: 41 mL/min — ABNORMAL LOW (ref >60)
Glucose, Bld: 96 mg/dL (ref 70–99)
Potassium: 4.1 mmol/L (ref 3.5–5.1)
Sodium: 138 mmol/L (ref 135–145)

## 2022-06-17 LAB — GLUCOSE, CAPILLARY
Glucose-Capillary: 98 mg/dL (ref 70–99)
Glucose-Capillary: 84 mg/dL (ref 70–99)
Glucose-Capillary: 98 mg/dL (ref 70–99)

## 2022-06-17 MED ORDER — HYOSCYAMINE SULFATE 0.125 MG PO TBDP
0.1250 mg | ORAL_TABLET | ORAL | Status: DC | PRN
  Administered 2022-06-17: 0.125 mg via SUBLINGUAL
  Filled 2022-06-17 (×2): qty 1

## 2022-06-17 MED ORDER — HYOSCYAMINE SULFATE 0.125 MG PO TBDP
0.1250 mg | ORAL_TABLET | ORAL | 0 refills | Status: DC | PRN
  Filled 2022-06-17 (×2): qty 30, 5d supply, fill #0

## 2022-06-17 MED ORDER — OXYCODONE HCL 5 MG PO TABS
5.0000 mg | ORAL_TABLET | Freq: Four times a day (QID) | ORAL | 0 refills | Status: AC | PRN
  Filled 2022-06-17: qty 20, 5d supply, fill #0

## 2022-06-17 NOTE — Evaluation (Signed)
Physical Therapy Evaluation Patient Details Name: Laura [name] MRN: [id number] DOB: May 19, 1945 Today's Date: 06/17/2022  History of Present Illness  77 yo female admitted with Abd pain. Hx of anemia, OA, cercival disc disease, DM, COVID, ureteral stent placement, neuropathy, Ov Cancer  Clinical Impression  On eval, pt was Mod Ind with mobility. She walked ~200 feet and climbed a few stairs. Pt reports 4/10 pain with activity. Pt reports ongoing weakness and dyspnea for last few weeks. She is agreeable to HHPT f/u for general strength and endurance training.      Recommendations for follow up therapy are one component of a multi-disciplinary discharge planning process, led by the attending physician.  Recommendations may be updated based on patient status, additional functional criteria and insurance authorization.  Follow Up Recommendations       Assistance Recommended at Discharge PRN  Patient can return home with the following  Assistance with cooking/housework;Direct supervision/assist for financial management;Help with stairs or ramp for entrance    Equipment Recommendations None recommended by PT  Recommendations for Other Services       Functional Status Assessment Patient has had a recent decline in their functional status and demonstrates the ability to make significant improvements in function in a reasonable and predictable amount of time.     Precautions / Restrictions Precautions Precautions: Fall Restrictions Weight Bearing Restrictions: No      Mobility  Bed Mobility Overal bed mobility: Modified Independent                  Transfers Overall transfer level: Modified independent                      Ambulation/Gait Ambulation/Gait assistance: Modified independent (Device/Increase time) Gait Distance (Feet): 200 Feet Assistive device: None Gait Pattern/deviations: Step-through pattern, Decreased stride length       General Gait Details:  Decreased gait speed and stride lengths. Pt reports pain increases a bit with mobility.  Stairs Stairs: Yes Stairs assistance: Modified independent (Device/Increase time) Stair Management: Alternating pattern, Forwards, One rail Right Number of Stairs: 5    Wheelchair Mobility    Modified Rankin (Stroke Patients Only)       Balance Overall balance assessment: Mild deficits observed, not formally tested                                           Pertinent Vitals/Pain Pain Assessment Pain Assessment: 0-10 Pain Score: 4  Pain Location: R side/lower abdomen Pain Descriptors / Indicators: Aching Pain Intervention(s): Monitored during session    Home Living Family/patient expects to be discharged to:: Private residence Living Arrangements: Alone Available Help at Discharge: Family;Available PRN/intermittently Type of Home: House Home Access: Stairs to enter Entrance Stairs-Rails: Right;Left;Can reach both Entrance Stairs-Number of Steps: 4   Home Layout: One level Home Equipment: Agricultural consultant (2 wheels)      Prior Function Prior Level of Function : Independent/Modified Independent             Mobility Comments: ind amb ADLs Comments: still driving     Hand Dominance        Extremity/Trunk Assessment   Upper Extremity Assessment Upper Extremity Assessment: Overall WFL for tasks assessed    Lower Extremity Assessment Lower Extremity Assessment: Generalized weakness (L LE edema)    Cervical / Trunk Assessment Cervical / Trunk Assessment: Normal  Communication   Communication: No difficulties  Cognition Arousal/Alertness: Awake/alert Behavior During Therapy: WFL for tasks assessed/performed Overall Cognitive Status: Within Functional Limits for tasks assessed                                          General Comments      Exercises     Assessment/Plan    PT Assessment All further PT needs can be met in the  next venue of care (HHPT for general strength and endurance training)  PT Problem List Decreased strength;Decreased range of motion;Decreased activity tolerance;Decreased balance;Decreased mobility;Decreased knowledge of use of DME;Pain       PT Treatment Interventions      PT Goals (Current goals can be found in the Care Plan section)  Acute Rehab PT Goals Patient Stated Goal: less pain. get better PT Goal Formulation: With patient Time For Goal Achievement: 07/01/22 Potential to Achieve Goals: Good    Frequency       Co-evaluation               AM-PAC PT "6 Clicks" Mobility  Outcome Measure Help needed turning from your back to your side while in a flat bed without using bedrails?: None Help needed moving from lying on your back to sitting on the side of a flat bed without using bedrails?: None Help needed moving to and from a bed to a chair (including a wheelchair)?: None Help needed standing up from a chair using your arms (e.g., wheelchair or bedside chair)?: None Help needed to walk in hospital room?: None Help needed climbing 3-5 steps with a railing? : None 6 Click Score: 24    End of Session Equipment Utilized During Treatment: Gait belt Activity Tolerance: Patient tolerated treatment well;Patient limited by pain Patient left: in bed;with call bell/phone within reach;with family/visitor present   PT Visit Diagnosis: Pain;Muscle weakness (generalized) (M62.81)    Time: 1135-1150 PT Time Calculation (min) (ACUTE ONLY): 15 min   Charges:   PT Evaluation $PT Eval Low Complexity: 1 Low            Faye Ramsay, PT Acute Rehabilitation  Office: [phone]

## 2022-06-17 NOTE — Patient Outreach (Signed)
  Care Coordination   Care Coordination  Visit Note   06/17/2022 Name: Laura [name] MRN: [id number] DOB: 1945-10-07  Laura [name] is a 77 y.o. year old female who sees Corwin Levins, MD for primary care.   Care Coordination: Discussed patient case with LCSW regarding plan for care. Patient outreach to LCSW/RN regarding hospitalization.    Goals Addressed             This Visit's Progress    Care Coordination Activities       Interventions Today    Flowsheet Row Most Recent Value  General Interventions   General Interventions Discussed/Reviewed Communication with  Communication with Social Work  [care coordination: patient reached out to RN/LCSW to notify of recent hospitalization due to pain.]            SDOH assessments and interventions completed:  No  Care Coordination Interventions:  Yes, provided   Follow up plan:  LCSW to return call to patient    Encounter Outcome:  Pt. Visit Completed   [name], RN, MSN, BSN, CCM [facility] Care Coordinator [phone]

## 2022-06-17 NOTE — TOC Progression Note (Signed)
Transition of Care [facility]) - Progression Note    Patient Details  Name: Laura [name] MRN: [id number] Date of Birth: 1945-09-22  Transition [facility] Care [facility] CM/SW Contact  Huston Foley Jacklynn Ganong, RN Phone Number: 06/17/2022, 1:00 PM  Clinical Narrative:    Case Manager spoke with Patient concerning recommendation for Home Health PT. Patient is agreeable and has no preference for agency, permission given to CM to locate agency that accepts her coverage and is well recognized. Referral called to [facility], Liaison with [facility]. Patient states she was independent prior to hospitalization and plans to return to that state. She has family nearby that can assist if needed.      Barriers to Discharge: Continued Medical Work up  Expected Discharge Plan and Services   Discharge Planning Services: CM Consult Post Acute Care Choice: Home Health Living arrangements for the past 2 months: Single Family Home                 DME Arranged: N/A DME Agency: NA       HH Arranged: PT HH Agency: [facility] Date HH Agency Contacted: 06/17/22 Time HH Agency Contacted: 1257 Representative spoke with at [facility] Agency: Tresa Endo   Social Determinants of Health (SDOH) Interventions SDOH Screenings   Food Insecurity: Patient Declined (06/15/2022)  Housing: Patient Declined (06/15/2022)  Transportation Needs: No Transportation Needs (06/15/2022)  Utilities: Not At Risk (06/15/2022)  Alcohol Screen: Low Risk  (06/17/2021)  Depression (PHQ2-9): High Risk (06/04/2022)  Financial Resource Strain: Low Risk  (04/01/2022)  Physical Activity: Inactive (04/01/2022)  Social Connections: Moderately Integrated (04/01/2022)  Stress: No Stress Concern Present (04/01/2022)  Recent Concern: Stress - Stress Concern Present (03/21/2022)  Tobacco Use: Low Risk  (06/15/2022)    Readmission Risk Interventions     No data to display

## 2022-06-17 NOTE — Progress Notes (Signed)
Mobility Specialist - Progress Note   06/17/22 0940  Mobility  Activity Ambulated with assistance in hallway  Level of Assistance Standby assist, set-up cues, supervision of patient - no hands on  Assistive Device None  Distance Ambulated (ft) 470 ft  Range of Motion/Exercises Active  Activity Response Tolerated well  Mobility Referral Yes  $Mobility charge 1 Mobility  Mobility Specialist Start Time (ACUTE ONLY) [id number]  Mobility Specialist Stop Time (ACUTE ONLY) [id number]  Mobility Specialist Time Calculation (min) (ACUTE ONLY) 13 min   Pt received in bed and agreed mobility, had some shakiness nearing midway point, felt SOB. Pt SpO2 was at 99%.  Returned to chair with all needs met.  Marilynne Halsted Mobility Specialist

## 2022-06-17 NOTE — Discharge Summary (Addendum)
Physician Discharge Summary  Laura [name] WUJ:[id number] DOB: 08-18-1945 DOA: 06/15/2022  PCP: Corwin Levins, MD  Admit date: 06/15/2022 Discharge date: 06/17/2022  Time spent: 40 minutes  Recommendations for Outpatient Follow-up:  Follow outpatient CBC/CMP  Pain management - follow with urology and oncology outpatient Follow abnormal imaging findings, necrotizing lymphadenopathy, progressive metastatic disease  Discharge Diagnoses:  Principal Problem:   Intractable abdominal pain Active Problems:   GERD (gastroesophageal reflux disease)   Diabetes mellitus type 2, noninsulin dependent (HCC)   OSA (obstructive sleep apnea)   Abdominal pain   Ovarian cancer (HCC)   Stage 3b chronic kidney disease (CKD) (HCC)   Hydronephrosis of right kidney   Discharge Condition: stable  Diet recommendation: heart healthy   Filed Weights   06/15/22 1114  Weight: 83.5 kg    History of present illness:   Laura [name] is Laura [name] 77 y.o. female with medical history significant of type 2 diabetes, right-sided hydronephrosis status post right ureteral stent placement, ovarian cancer, history of radiation therapy and multiple other medical issues who presented to the emergency department with complaints of right lower abdominal and flank pain for the past few months that has been getting progressively worse over the past 2 weeks.    Imaging showed necrotizing lymphadenopathy, progression of metastatic disease, stable moderate R hydro with right ureteral stent.    Stable for discharge today.   Hospital Course:  Assessment and Plan:  Intractable abdominal pain Stage IIIc Ovarian Serous Carcinoma Moderate Right Hydronephrosis s/p Stent CT without intestinal obstruction or pneumoperitoneum, moderate R hydro, righter ureteral stent, mild L hydro, 6.4x3.7 cm low density lesion in the retroperitoneal location suggesting necrotic lymphadenopathy, interval increase in number and size of retroperitoneal LN's  suggesting progression of metastatic disease, enlarged retrocrural lymph nodes suggesting metastatic LAD (see report)  Multifactorial abdominal pain related to malignancy Scheduled APAP, oxycodone, morphine for breakthrough --- will send home with anaspaz per discussion with urology, she requested oxycodone rather than the percocet she has at home, will send with 5 days worth of oxycodone Dr. Ellin Saba will follow up after discharge to discuss chemo - will stop steroids, Dr. Ellin Saba notes they're most helpful with bone pain.   Follow outpatient with Dr. Ellin Saba Discussed with urology prior to discharge   GERD (gastroesophageal reflux disease) Famotidine 20 mg p.o. at bedtime.   Diabetes mellitus type 2, noninsulin dependent (HCC) Hemoglobin A1c of 6.0% last month. Carbohydrate modified diet. Hold oral hypoglycemics CBG monitoring with RI SS while on glucocorticoids.   OSA (obstructive sleep apnea) Currently not on CPAP.   Stage 3b chronic kidney disease (CKD) (HCC) Monitor renal function and electrolytes.   Hydronephrosis of right kidney Follow-up with Dr. Arita Miss as scheduled.    Procedures: none   Consultations: Urology Oncology (via chat)  Discharge Exam: Vitals:   06/17/22 1023 06/17/22 1247  BP: (!) 163/70 (!) 179/82  Pulse: 72 79  Resp:  16  Temp:  97.8 F (36.6 C)  SpO2:  100%   4/10 pain today She's ok with discharge  General: No acute distress. Cardiovascular: RRR Lungs: unlabored Abdomen: Soft, nontender, nondistended Neurological: Alert and oriented 3. Moves all extremities 4 with equal strength. Cranial nerves II through XII grossly intact. Extremities: No clubbing or cyanosis. No edema.  Discharge Instructions   Discharge Instructions     Call MD for:  difficulty breathing, headache or visual disturbances   Complete by: As directed    Call MD for:  extreme fatigue  Complete by: As directed    Call MD for:  hives   Complete by: As  directed    Call MD for:  persistant dizziness or light-headedness   Complete by: As directed    Call MD for:  persistant nausea and vomiting   Complete by: As directed    Call MD for:  redness, tenderness, or signs of infection (pain, swelling, redness, odor or green/yellow discharge around incision site)   Complete by: As directed    Call MD for:  severe uncontrolled pain   Complete by: As directed    Call MD for:  temperature >100.4   Complete by: As directed    Diet - low sodium heart healthy   Complete by: As directed    Diet - low sodium heart healthy   Complete by: As directed    Discharge instructions   Complete by: As directed    You were seen for abdominal and flank pain.  I suspect this is related to your cancer (you have necrotizing lymphadenopathy) and possibly the stent.  You have more lymph nodes suggesting progression of metastatic disease.  Follow up with Dr. Ellin Saba about this.  Your pain seems improved with oxycodone.  I'll send you with some as needed oxycodone.  Don't take this with the percocet, they're the same medicine.  Since the oxycodone worked better, you can ask for this next time from your doctor.  We'll also send you with anaspaz.  If this helps, you can continue to use it as needed for your pain.  It might help reduce the amount of opiates you need.   Follow up as an outpatient with Dr. Ellin Saba and Dr. Arita Miss.  They can refill your pain meds as needed.  Return for new, recurrent or worsening symptoms.  Please ask your PCP to request records from this hospitalization so they know what was done and what the next steps will be.   Increase activity slowly   Complete by: As directed    Increase activity slowly   Complete by: As directed       Allergies as of 06/17/2022       Reactions   Ciprofloxacin Swelling   Torn tendon   Hydrocodone Bit-homatrop Mbr Other (See Comments)   Vertigo *pt strongly prefers to never take* took 3 years to recover  from   Prednisone Anxiety   *pt strongly prefers to never be given prednisone*   Sulfa Antibiotics Hives, Itching, Swelling   Tongue swells   Alfuzosin Other (See Comments)   Weakness and fatigue   Codeine Itching   Crestor [rosuvastatin Calcium] Other (See Comments)   Did something to memory    Doxycycline Other (See Comments)   Severe rectal Gas.   Gabapentin    disoriented   Keflex [cephalexin] Diarrhea, Nausea And Vomiting   Myrbetriq [mirabegron Er] Swelling   Swelling of legs and feet   Naproxen Other (See Comments)   Stomach cramps/loud gas   Statins    Muscle weakness        Medication List     STOP taking these medications    oxyCODONE-acetaminophen 5-325 MG tablet Commonly known as: PERCOCET/ROXICET   traMADol 50 MG tablet Commonly known as: ULTRAM       TAKE these medications    acetaminophen 500 MG tablet Commonly known as: TYLENOL Take 1 tablet (500 mg total) by mouth every 6 (six) hours as needed.   amLODipine 5 MG tablet Commonly known as: NORVASC TAKE 1  TABLET (5 MG TOTAL) BY MOUTH DAILY.   Benefiber Chew Chew 3 tablets by mouth daily.   cyanocobalamin 1000 MCG tablet Commonly known as: VITAMIN B12 Take 1,000 mcg by mouth daily.   eszopiclone 2 MG Tabs tablet Commonly known as: Lunesta Take 1 tablet (2 mg total) by mouth at bedtime as needed for sleep. Take immediately before bedtime   glimepiride 2 MG tablet Commonly known as: AMARYL Take 0.5 tablets (1 mg total) by mouth daily before breakfast. (Take 0.5 tablets (1 mg total) by mouth daily before breakfast. 1/2 of 2 mg tab)   hyoscyamine 0.125 MG Tbdp disintergrating tablet Commonly known as: ANASPAZ Place 1 tablet (0.125 mg total) under the tongue every 4 (four) hours as needed for up to 5 days for bladder spasms.   loratadine 10 MG tablet Commonly known as: CLARITIN Take 10 mg by mouth daily.   mirtazapine 15 MG tablet Commonly known as: REMERON Take 1 tablet (15 mg total)  by mouth at bedtime.   ONE TOUCH ULTRA 2 w/Device Kit Use as directed   OneTouch Ultra test strip Generic drug: glucose blood USE TO CHECK BLOOD SUGARS TWO TIMES DAILY   onetouch ultrasoft lancets Use as directed. (1 each by Other route as needed for other. Use as instructed)   oxyCODONE 5 MG immediate release tablet Commonly known as: Roxicodone Take 1 tablet (5 mg total) by mouth every 6 (six) hours as needed for up to 5 days.   PEPCID AC PO Take 1 tablet by mouth daily as needed (indigestion).   PEPTO-BISMOL MAX STRENGTH PO Take 10 mLs by mouth every 8 (eight) hours as needed (IBS symptoms).   solifenacin 5 MG tablet Commonly known as: VESICARE Take 1 tablet (5 mg total) by mouth daily.   Vitamin D3 50 MCG (2000 UT) Tabs Take 2,000 Units by mouth daily.       Allergies  Allergen Reactions   Ciprofloxacin Swelling    Torn tendon   Hydrocodone Bit-Homatrop Mbr Other (See Comments)    Vertigo *pt strongly prefers to never take* took 3 years to recover from   Prednisone Anxiety    *pt strongly prefers to never be given prednisone*    Sulfa Antibiotics Hives, Itching and Swelling    Tongue swells   Alfuzosin Other (See Comments)    Weakness and fatigue   Codeine Itching   Crestor [Rosuvastatin Calcium] Other (See Comments)    Did something to memory     Doxycycline Other (See Comments)    Severe rectal Gas.   Gabapentin     disoriented   Keflex [Cephalexin] Diarrhea and Nausea And Vomiting   Myrbetriq Theodosia Paling Er] Swelling    Swelling of legs and feet   Naproxen Other (See Comments)    Stomach cramps/loud gas   Statins     Muscle weakness    Follow-up Information     [facility] Follow up.   Specialty: Home Health Services Why: Home Health PT setup with [facility],someone from [facility] will contact you to arrange start date and time for your therapy. Contact information: [address] [place]  [address] [phone]                  The results of significant diagnostics from this hospitalization (including imaging, microbiology, ancillary and laboratory) are listed below for reference.    Significant Diagnostic Studies: VAS [place] LOWER EXTREMITY VENOUS (DVT)  Result Date: 06/16/2022  Lower Venous DVT  Study Patient Name:  Laura [name]  Date of Exam:   06/15/2022 Medical Rec #: [id number]      Accession #:    [id number] Date of Birth: 12-01-1945      Patient Gender: F Patient Age:   59 years Exam Location:  [facility] Procedure:      VAS [place] LOWER EXTREMITY VENOUS (DVT) Referring Phys: DAVID ORTIZ --------------------------------------------------------------------------------  Indications: Swelling.  Risk Factors: Cancer. Limitations: Poor ultrasound/tissue interface. Comparison Study: No prior studies. Performing Technologist: Chanda Busing RVT  Examination Guidelines: Camary Sosa complete evaluation includes B-mode imaging, spectral Doppler, color Doppler, and power Doppler as needed of all accessible portions of each vessel. Bilateral testing is considered an integral part of Avyay Coger complete examination. Limited examinations for reoccurring indications may be performed as noted. The reflux portion of the exam is performed with the patient in reverse Trendelenburg.  +---------+---------------+---------+-----------+----------+--------------+ RIGHT    CompressibilityPhasicitySpontaneityPropertiesThrombus Aging +---------+---------------+---------+-----------+----------+--------------+ CFV      Full           Yes      Yes                                 +---------+---------------+---------+-----------+----------+--------------+ SFJ      Full                                                        +---------+---------------+---------+-----------+----------+--------------+ FV Prox  Full                                                         +---------+---------------+---------+-----------+----------+--------------+ FV Mid   Full                                                        +---------+---------------+---------+-----------+----------+--------------+ FV DistalFull                                                        +---------+---------------+---------+-----------+----------+--------------+ PFV      Full                                                        +---------+---------------+---------+-----------+----------+--------------+ POP      Full           Yes      Yes                                 +---------+---------------+---------+-----------+----------+--------------+ PTV      Full                                                        +---------+---------------+---------+-----------+----------+--------------+  PERO     Full                                                        +---------+---------------+---------+-----------+----------+--------------+   +---------+---------------+---------+-----------+----------+--------------+ LEFT     CompressibilityPhasicitySpontaneityPropertiesThrombus Aging +---------+---------------+---------+-----------+----------+--------------+ CFV      Full           Yes      Yes                                 +---------+---------------+---------+-----------+----------+--------------+ SFJ      Full                                                        +---------+---------------+---------+-----------+----------+--------------+ FV Prox  Full                                                        +---------+---------------+---------+-----------+----------+--------------+ FV Mid   Full           Yes      Yes                                 +---------+---------------+---------+-----------+----------+--------------+ FV Distal               Yes      Yes                                  +---------+---------------+---------+-----------+----------+--------------+ PFV      Full                                                        +---------+---------------+---------+-----------+----------+--------------+ POP      Full           Yes      Yes                                 +---------+---------------+---------+-----------+----------+--------------+ PTV      Full                                                        +---------+---------------+---------+-----------+----------+--------------+ PERO     Full                                                        +---------+---------------+---------+-----------+----------+--------------+  Summary: RIGHT: - There is no evidence of deep vein thrombosis in the lower extremity. However, portions of this examination were limited- see technologist comments above.  - No cystic structure found in the popliteal fossa.  LEFT: - There is no evidence of deep vein thrombosis in the lower extremity. However, portions of this examination were limited- see technologist comments above.  - No cystic structure found in the popliteal fossa.  *See table(s) above for measurements and observations. Electronically signed by Lemar Livings MD on 06/16/2022 at 4:38:36 PM.    Final    CT ABDOMEN PELVIS W CONTRAST  Result Date: 06/14/2022 CLINICAL DATA:  Right flank pain EXAM: CT ABDOMEN AND PELVIS WITH CONTRAST TECHNIQUE: Multidetector CT imaging of the abdomen and pelvis was performed using the standard protocol following bolus administration of intravenous contrast. RADIATION DOSE REDUCTION: This exam was performed according to the departmental dose-optimization program which includes automated exposure control, adjustment of the mA and/or kV according to patient size and/or use of iterative reconstruction technique. CONTRAST:  80mL OMNIPAQUE IOHEXOL 300 MG/ML  SOLN COMPARISON:  04/10/2022 FINDINGS: Lower chest: Visualized lower lung fields are clear.  Scattered coronary artery calcifications are seen. Hepatobiliary: There are ill-defined foci of decreased density in liver close to bony bodies and falciform ligament, possibly due to aberrant venous drainage or focal fatty infiltration. There is no dilation of bile ducts. Surgical clips are seen in gallbladder fossa. Pancreas: No focal abnormalities are seen. Spleen: Unremarkable. Adrenals/Urinary Tract: Adrenals are unremarkable. There is moderate right hydronephrosis. Right ureteral stent is seen. There is mild left hydronephrosis. There is no wall thickening and urinary bladder lower portion of the urinary bladder is extending below the level of pubic symphysis suggesting possible cystocele. Stomach/Bowel: Moderate sized fixed hiatal hernia is seen. Small bowel loops are not dilated. Appendix is not distinctly seen. There is no pericecal inflammation. There is no significant wall thickening in colon. Few diverticula are seen in colon without signs of focal acute diverticulitis. Vascular/Lymphatic: Scattered arterial calcifications are seen in aorta and its major branches. There are few enlarged retrocrural lymph nodes measuring up to 9 mm in short axis. There is para-aortic lymph node on the left side slightly below the left renal artery measuring 12 mm in short axis. There is 6.4 by 3.7 cm low-density mass in retroperitoneum at the level of aortic bifurcation suggesting necrotic lymphadenopathy. There is 2.4 x 2.2 cm soft tissue density in the anterior aspect of the right psoas muscle at the level of midportion of right kidney, possibly new lymphadenopathy. Reproductive: Uterus is not seen. No dominant adnexal masses are seen. Other: There is no ascites or pneumoperitoneum. Musculoskeletal: There is first-degree anterolisthesis at the L4-L5 level. Degenerative changes are noted in lumbar spine, more so in the facet joints in lower lumbar spine. There is spinal stenosis and encroachment of neural foramina at  L4-L5 level. IMPRESSION: There is no evidence of intestinal obstruction or pneumoperitoneum. There is moderate right hydronephrosis. Right ureteral stent is noted. There is mild left hydronephrosis. There is 6.4 x 3.7 cm low-density lesion in the retroperitoneal location suggesting necrotic lymphadenopathy. There is interval increase in number and size of retroperitoneal lymph nodes suggesting progression of metastatic disease. There are enlarged retrocrural lymph nodes suggesting metastatic lymphadenopathy. Moderate sized fixed hiatal hernia. There are faint areas of decreased density in liver may suggest normal variation due to aberrant venous drainage or focal fatty infiltration. Lumbar spondylosis. Electronically Signed   By: Harlan Stains.D.  On: 06/14/2022 12:39    Microbiology: No results found for this or any previous visit (from the past 240 hour(s)).   Labs: Basic Metabolic Panel: Recent Labs  Lab 06/14/22 1115 06/15/22 1216 06/16/22 0514 06/17/22 0844  NA 136 132* 134* 138  K 4.1 4.2 4.6 4.1  CL 103 102 108 106  CO2 21* 20* 20* 25  GLUCOSE 152* 201* 184* 96  BUN 27* 26* 27* 34*  CREATININE 1.26* 1.31* 1.23* 1.34*  CALCIUM 9.9 9.4 9.3 10.3   Liver Function Tests: Recent Labs  Lab 06/14/22 1115 06/15/22 1216  AST 17 21  ALT 6 10  ALKPHOS 65 67  BILITOT 0.3 0.3  PROT 7.4 7.7  ALBUMIN 4.5 4.1   Recent Labs  Lab 06/14/22 1115 06/15/22 1216  LIPASE <10* 22   No results for input(s): "AMMONIA" in the last 168 hours. CBC: Recent Labs  Lab 06/14/22 1115 06/15/22 1216 06/16/22 0514  WBC 7.6 8.7 6.0  NEUTROABS  --  6.9  --   HGB 12.5 12.2 11.0*  HCT 37.5 38.3 33.7*  MCV 86.8 89.1 88.5  PLT 215 219 191   Cardiac Enzymes: No results for input(s): "CKTOTAL", "CKMB", "CKMBINDEX", "TROPONINI" in the last 168 hours. BNP: BNP (last 3 results) No results for input(s): "BNP" in the last 8760 hours.  ProBNP (last 3 results) No results for input(s): "PROBNP"  in the last 8760 hours.  CBG: Recent Labs  Lab 06/16/22 1142 06/16/22 1707 06/16/22 2017 06/17/22 0743 06/17/22 1211  GLUCAP 113* 149* 179* 98 84       Signed:  Lacretia Nicks MD.  [facility] 06/17/2022, 4:46 PM

## 2022-06-18 ENCOUNTER — Emergency Department (HOSPITAL_COMMUNITY)
Admission: EM | Admit: 2022-06-18 | Discharge: 2022-06-18 | Disposition: A | Discharge status: Home / Self Care | Payer: 59 | Attending: Emergency Medicine | Admitting: Emergency Medicine

## 2022-06-18 ENCOUNTER — Emergency Department (HOSPITAL_COMMUNITY): Payer: 59

## 2022-06-18 ENCOUNTER — Inpatient Hospital Stay: Payer: 59 | Admitting: Hematology

## 2022-06-18 ENCOUNTER — Other Ambulatory Visit (HOSPITAL_BASED_OUTPATIENT_CLINIC_OR_DEPARTMENT_OTHER): Payer: Self-pay

## 2022-06-18 ENCOUNTER — Other Ambulatory Visit: Payer: Self-pay

## 2022-06-18 ENCOUNTER — Encounter (HOSPITAL_COMMUNITY): Payer: Self-pay | Admitting: Hematology

## 2022-06-18 DIAGNOSIS — Z8543 Personal history of malignant neoplasm of ovary: Secondary | ICD-10-CM | POA: Diagnosis not present

## 2022-06-18 DIAGNOSIS — N1330 Unspecified hydronephrosis: Secondary | ICD-10-CM | POA: Diagnosis not present

## 2022-06-18 DIAGNOSIS — R109 Unspecified abdominal pain: Secondary | ICD-10-CM | POA: Insufficient documentation

## 2022-06-18 DIAGNOSIS — R1031 Right lower quadrant pain: Secondary | ICD-10-CM | POA: Diagnosis not present

## 2022-06-18 LAB — COMPREHENSIVE METABOLIC PANEL
ALT: 11 U/L (ref 0–44)
AST: 29 U/L (ref 15–41)
Albumin: 4.2 g/dL (ref 3.5–5.0)
Alkaline Phosphatase: 58 U/L (ref 38–126)
Anion gap: 11 (ref 5–15)
BUN: 29 mg/dL — ABNORMAL HIGH (ref 8–23)
CO2: 21 mmol/L — ABNORMAL LOW (ref 22–32)
Calcium: 9.7 mg/dL (ref 8.9–10.3)
Chloride: 100 mmol/L (ref 98–111)
Creatinine, Ser: 1.45 mg/dL — ABNORMAL HIGH (ref 0.44–1.00)
GFR, Estimated: 37 mL/min — ABNORMAL LOW (ref >60)
Glucose, Bld: 114 mg/dL — ABNORMAL HIGH (ref 70–99)
Potassium: 4.8 mmol/L (ref 3.5–5.1)
Sodium: 132 mmol/L — ABNORMAL LOW (ref 135–145)
Total Bilirubin: 0.9 mg/dL (ref 0.3–1.2)
Total Protein: 8.1 g/dL (ref 6.5–8.1)

## 2022-06-18 LAB — CBC WITH DIFFERENTIAL/PLATELET
Abs Immature Granulocytes: 0.03 10*3/uL (ref 0.00–0.07)
Basophils Absolute: 0 10*3/uL (ref 0.0–0.1)
Basophils Relative: 1 %
Eosinophils Absolute: 0.2 10*3/uL (ref 0.0–0.5)
Eosinophils Relative: 2 %
HCT: 41.2 % (ref 36.0–46.0)
Hemoglobin: 13.3 g/dL (ref 12.0–15.0)
Immature Granulocytes: 0 %
Lymphocytes Relative: 27 %
Lymphs Abs: 2 10*3/uL (ref 0.7–4.0)
MCH: 28.8 pg (ref 26.0–34.0)
MCHC: 32.3 g/dL (ref 30.0–36.0)
MCV: 89.2 fL (ref 80.0–100.0)
Monocytes Absolute: 0.6 10*3/uL (ref 0.1–1.0)
Monocytes Relative: 8 %
Neutro Abs: 4.5 10*3/uL (ref 1.7–7.7)
Neutrophils Relative %: 62 %
Platelets: 200 10*3/uL (ref 150–400)
RBC: 4.62 MIL/uL (ref 3.87–5.11)
RDW: 14.6 % (ref 11.5–15.5)
WBC: 7.3 10*3/uL (ref 4.0–10.5)
nRBC: 0 % (ref 0.0–0.2)

## 2022-06-18 LAB — URINALYSIS, ROUTINE W REFLEX MICROSCOPIC
Bilirubin Urine: NEGATIVE
Glucose, UA: NEGATIVE mg/dL
Hgb urine dipstick: NEGATIVE
Ketones, ur: NEGATIVE mg/dL
Leukocytes,Ua: NEGATIVE
Nitrite: NEGATIVE
Protein, ur: NEGATIVE mg/dL
Specific Gravity, Urine: 1.005 (ref 1.005–1.030)
pH: 6 (ref 5.0–8.0)

## 2022-06-18 MED ORDER — ONDANSETRON HCL 4 MG/2ML IJ SOLN
4.0000 mg | Freq: Once | INTRAMUSCULAR | Status: AC
  Administered 2022-06-18: 4 mg via INTRAVENOUS
  Filled 2022-06-18: qty 2

## 2022-06-18 MED ORDER — HYDROMORPHONE HCL 1 MG/ML IJ SOLN
0.5000 mg | Freq: Once | INTRAMUSCULAR | Status: AC
  Administered 2022-06-18: 0.5 mg via INTRAVENOUS
  Filled 2022-06-18: qty 1

## 2022-06-18 MED ORDER — KETOROLAC TROMETHAMINE 30 MG/ML IJ SOLN
15.0000 mg | Freq: Once | INTRAMUSCULAR | Status: AC
  Administered 2022-06-18: 15 mg via INTRAVENOUS
  Filled 2022-06-18: qty 1

## 2022-06-18 MED ORDER — HYDROMORPHONE HCL 1 MG/ML IJ SOLN
1.0000 mg | Freq: Once | INTRAMUSCULAR | Status: AC
  Administered 2022-06-18: 1 mg via INTRAVENOUS
  Filled 2022-06-18: qty 1

## 2022-06-18 MED ORDER — DOCUSATE SODIUM 100 MG PO CAPS
100.0000 mg | ORAL_CAPSULE | Freq: Two times a day (BID) | ORAL | 0 refills | Status: DC
  Filled 2022-06-18: qty 100, 50d supply, fill #0

## 2022-06-18 MED ORDER — HYDROMORPHONE HCL 4 MG PO TABS
4.0000 mg | ORAL_TABLET | Freq: Four times a day (QID) | ORAL | 0 refills | Status: DC | PRN
  Filled 2022-06-18: qty 20, 5d supply, fill #0

## 2022-06-18 NOTE — ED Provider Notes (Signed)
[facility] AT [facility] Provider Note   CSN: [id number] Arrival date & time: 06/18/22  4098     History {Add pertinent medical, surgical, social history, OB history to HPI:1} Chief Complaint  Patient presents with   Flank Pain    Laura [name] is a 77 y.o. female.  Patient with ovarian cancer.  She has been taking Percocets for pain and states it is not helping.  She has a stent in her right ureter.   Flank Pain       Home Medications Prior to Admission medications   Medication Sig Start Date End Date Taking? Authorizing Provider  docusate sodium (COLACE) 100 MG capsule Take 1 capsule (100 mg total) by mouth every 12 (twelve) hours. 06/18/22  Yes Bethann Berkshire, MD  HYDROmorphone (DILAUDID) 4 MG tablet Take 1 tablet (4 mg total) by mouth every 6 (six) hours as needed for severe pain. 06/18/22  Yes Bethann Berkshire, MD  acetaminophen (TYLENOL) 500 MG tablet Take 1 tablet (500 mg total) by mouth every 6 (six) hours as needed. 05/16/20   Kerrin Champagne, MD  amLODipine (NORVASC) 5 MG tablet TAKE 1 TABLET (5 MG TOTAL) BY MOUTH DAILY. 11/07/21   Corwin Levins, MD  Bismuth Subsalicylate (PEPTO-BISMOL MAX STRENGTH PO) Take 10 mLs by mouth every 8 (eight) hours as needed (IBS symptoms). 06/13/21   [provider]  Blood Glucose Monitoring Suppl (ONE TOUCH ULTRA 2) w/Device KIT Use as directed 04/06/15   Corwin Levins, MD  Cholecalciferol (VITAMIN D3) 50 MCG (2000 UT) TABS Take 2,000 Units by mouth daily.    [provider]  eszopiclone (LUNESTA) 2 MG TABS tablet Take 1 tablet (2 mg total) by mouth at bedtime as needed for sleep. Take immediately before bedtime 06/04/22   Corwin Levins, MD  Famotidine (PEPCID AC PO) Take 1 tablet by mouth daily as needed (indigestion).    [provider]  glimepiride (AMARYL) 2 MG tablet Take 0.5 tablets (1 mg total) by mouth daily before breakfast. 1/2 of 2 mg tab 07/08/21   Corwin Levins, MD  glucose blood  [facility] ULTRA) test strip USE TO CHECK BLOOD SUGARS TWO TIMES DAILY 03/22/21   Corwin Levins, MD  hyoscyamine (ANASPAZ) 0.125 MG TBDP disintergrating tablet Place 1 tablet (0.125 mg total) under the tongue every 4 (four) hours as needed for up to 5 days for bladder spasms. 06/17/22 06/22/22  Zigmund Daniel., MD  Lancets [facility] ULTRASOFT) lancets 1 each by Other route as needed for other. Use as instructed 03/20/21   Corwin Levins, MD  loratadine (CLARITIN) 10 MG tablet Take 10 mg by mouth daily.    [provider]  mirtazapine (REMERON) 15 MG tablet Take 1 tablet (15 mg total) by mouth at bedtime. 06/04/22   Corwin Levins, MD  oxyCODONE (ROXICODONE) 5 MG immediate release tablet Take 1 tablet (5 mg total) by mouth every 6 (six) hours as needed for up to 5 days. 06/17/22 06/22/22  Zigmund Daniel., MD  solifenacin (VESICARE) 5 MG tablet Take 1 tablet (5 mg total) by mouth daily. 02/14/22   Corwin Levins, MD  vitamin B-12 (CYANOCOBALAMIN) 1000 MCG tablet Take 1,000 mcg by mouth daily.    [provider]  Wheat Dextrin (BENEFIBER) CHEW Chew 3 tablets by mouth daily.    [provider]      Allergies    Ciprofloxacin, Hydrocodone bit-homatrop mbr, Prednisone, Sulfa antibiotics,  Alfuzosin, Codeine, Crestor [rosuvastatin calcium], Doxycycline, Gabapentin, Keflex [cephalexin], Myrbetriq [mirabegron er], Naproxen, and Statins    Review of Systems   Review of Systems  Genitourinary:  Positive for flank pain.    Physical Exam Updated Vital Signs BP (!) 148/69 (BP Location: Right Arm)   Pulse 63   Temp 98 F (36.7 C) (Oral)   Resp 16   SpO2 99%  Physical Exam  ED Results / Procedures / Treatments   Labs (all labs ordered are listed, but only abnormal results are displayed) Labs Reviewed  URINALYSIS, ROUTINE W REFLEX MICROSCOPIC - Abnormal; Notable for the following components:      Result Value   Color, Urine COLORLESS (*)    All other components within normal  limits  COMPREHENSIVE METABOLIC PANEL - Abnormal; Notable for the following components:   Sodium 132 (*)    CO2 21 (*)    Glucose, Bld 114 (*)    BUN 29 (*)    Creatinine, Ser 1.45 (*)    GFR, Estimated 37 (*)    All other components within normal limits  CBC WITH DIFFERENTIAL/PLATELET    EKG None  Radiology CT Renal Stone Study  Result Date: 06/18/2022 CLINICAL DATA:  Right flank pain.  History of ovarian cancer. EXAM: CT ABDOMEN AND PELVIS WITHOUT CONTRAST TECHNIQUE: Multidetector CT imaging of the abdomen and pelvis was performed following the standard protocol without IV contrast. RADIATION DOSE REDUCTION: This exam was performed according to the departmental dose-optimization program which includes automated exposure control, adjustment of the mA and/or kV according to patient size and/or use of iterative reconstruction technique. COMPARISON:  CT scan of June 14, 2022. FINDINGS: Lower chest: Moderate size sliding-type hiatal hernia. Minimal bibasilar subsegmental atelectasis. Hepatobiliary: No focal liver abnormality is seen. Status post cholecystectomy. No biliary dilatation. Pancreas: Unremarkable. No pancreatic ductal dilatation or surrounding inflammatory changes. Spleen: Normal in size without focal abnormality. Adrenals/Urinary Tract: Adrenal glands appear normal. Right-sided ureteral stent is noted in grossly good position. Mild right hydronephrosis is noted. Left kidney and ureter are unremarkable. Urinary bladder is unremarkable. Stomach/Bowel: Stomach is within normal limits. Appendix appears normal. No evidence of bowel wall thickening, distention, or inflammatory changes. Vascular/Lymphatic: Aortic atherosclerosis. As noted on prior CT scan, retroperitoneal adenopathy measuring 5.6 x 3.9 cm is noted highly concerning for necrotic adenopathy and metastatic disease. 13 x 10 mm right retrocrural lymph node is noted which was also noted on prior exam. Reproductive: Status post  hysterectomy. No adnexal masses. Other: No abdominal wall hernia or abnormality. No abdominopelvic ascites. Musculoskeletal: No acute or significant osseous findings. IMPRESSION: Right-sided ureteral stent is noted in grossly good position. Mild right hydronephrosis is noted. Retrocrural and retroperitoneal adenopathy concerning for metastatic disease is again noted as described on recent prior CT scan. Moderate size sliding-type hiatal hernia. Aortic Atherosclerosis (ICD10-I70.0). Electronically Signed   By: Lupita Raider M.D.   On: 06/18/2022 09:39    Procedures Procedures  {Document cardiac monitor, telemetry assessment procedure when appropriate:1}  Medications Ordered in ED Medications  HYDROmorphone (DILAUDID) injection 1 mg (1 mg Intravenous Given 06/18/22 0805)  ondansetron (ZOFRAN) injection 4 mg (4 mg Intravenous Given 06/18/22 0805)  ketorolac (TORADOL) 30 MG/ML injection 15 mg (15 mg Intravenous Given 06/18/22 0804)  HYDROmorphone (DILAUDID) injection 0.5 mg (0.5 mg Intravenous Given 06/18/22 1009)    ED Course/ Medical Decision Making/ A&P  Labs and urinalysis unremarkable.  CT renal shows stent in good position and draining.  Mild hydronephrosis {  Click here for ABCD2, HEART and other calculatorsREFRESH Note before signing :1}                          Medical Decision Making Amount and/or Complexity of Data Reviewed Labs: ordered. Radiology: ordered.  Risk OTC drugs. Prescription drug management.  Patient with ovarian cancer and mild hydronephrosis on the right side with working stent.  Percocet does not seem to be helping her pain so she is given Dilaudid and will follow-up with oncology and urology  {Document critical care time when appropriate:1} {Document review of labs and clinical decision tools ie heart score, Chads2Vasc2 etc:1}  {Document your independent review of radiology images, and any outside records:1} {Document your discussion with family members,  caretakers, and with consultants:1} {Document social determinants of health affecting pt's care:1} {Document your decision making why or why not admission, treatments were needed:1} Final Clinical Impression(s) / ED Diagnoses Final diagnoses:  Flank pain    Rx / DC Orders ED Discharge Orders          Ordered    HYDROmorphone (DILAUDID) 4 MG tablet  Every 6 hours PRN        06/18/22 1020    docusate sodium (COLACE) 100 MG capsule  Every 12 hours        06/18/22 1020

## 2022-06-18 NOTE — Discharge Instructions (Signed)
Call Dr. Marice Potter office and set up an appointment for either this week or next week.  Drink plenty of fluids and rest

## 2022-06-18 NOTE — ED Triage Notes (Signed)
Pt with right flank pain, Discharged yesterday after admission for same. States oxycodone not helping.  Cancer patient.

## 2022-06-19 ENCOUNTER — Other Ambulatory Visit: Payer: Self-pay | Admitting: *Deleted

## 2022-06-19 ENCOUNTER — Encounter: Payer: Self-pay | Admitting: Hematology

## 2022-06-19 ENCOUNTER — Other Ambulatory Visit (HOSPITAL_BASED_OUTPATIENT_CLINIC_OR_DEPARTMENT_OTHER): Payer: Self-pay

## 2022-06-19 ENCOUNTER — Inpatient Hospital Stay: Payer: 59 | Attending: Hematology | Admitting: Hematology

## 2022-06-19 VITALS — BP 158/66 | HR 88 | Temp 98.9°F | Resp 18 | Wt 189.9 lb

## 2022-06-19 DIAGNOSIS — D72829 Elevated white blood cell count, unspecified: Secondary | ICD-10-CM | POA: Diagnosis not present

## 2022-06-19 DIAGNOSIS — I129 Hypertensive chronic kidney disease with stage 1 through stage 4 chronic kidney disease, or unspecified chronic kidney disease: Secondary | ICD-10-CM | POA: Insufficient documentation

## 2022-06-19 DIAGNOSIS — C563 Malignant neoplasm of bilateral ovaries: Secondary | ICD-10-CM | POA: Diagnosis not present

## 2022-06-19 DIAGNOSIS — D631 Anemia in chronic kidney disease: Secondary | ICD-10-CM | POA: Insufficient documentation

## 2022-06-19 DIAGNOSIS — I890 Lymphedema, not elsewhere classified: Secondary | ICD-10-CM | POA: Diagnosis not present

## 2022-06-19 DIAGNOSIS — K5903 Drug induced constipation: Secondary | ICD-10-CM | POA: Diagnosis not present

## 2022-06-19 DIAGNOSIS — D696 Thrombocytopenia, unspecified: Secondary | ICD-10-CM | POA: Diagnosis not present

## 2022-06-19 DIAGNOSIS — Z95828 Presence of other vascular implants and grafts: Secondary | ICD-10-CM | POA: Diagnosis not present

## 2022-06-19 DIAGNOSIS — F32A Depression, unspecified: Secondary | ICD-10-CM | POA: Diagnosis not present

## 2022-06-19 DIAGNOSIS — Z5189 Encounter for other specified aftercare: Secondary | ICD-10-CM | POA: Diagnosis not present

## 2022-06-19 DIAGNOSIS — D630 Anemia in neoplastic disease: Secondary | ICD-10-CM | POA: Diagnosis not present

## 2022-06-19 DIAGNOSIS — Z5111 Encounter for antineoplastic chemotherapy: Secondary | ICD-10-CM | POA: Insufficient documentation

## 2022-06-19 DIAGNOSIS — G893 Neoplasm related pain (acute) (chronic): Secondary | ICD-10-CM | POA: Diagnosis not present

## 2022-06-19 DIAGNOSIS — D509 Iron deficiency anemia, unspecified: Secondary | ICD-10-CM | POA: Diagnosis not present

## 2022-06-19 DIAGNOSIS — N189 Chronic kidney disease, unspecified: Secondary | ICD-10-CM | POA: Diagnosis not present

## 2022-06-19 DIAGNOSIS — C772 Secondary and unspecified malignant neoplasm of intra-abdominal lymph nodes: Secondary | ICD-10-CM | POA: Diagnosis not present

## 2022-06-19 MED ORDER — LIDOCAINE 5 % EX PTCH: 1.0000 | MEDICATED_PATCH | CUTANEOUS | 2 refills | Status: DC

## 2022-06-19 MED ORDER — HYDROMORPHONE HCL 4 MG PO TABS: 4.0000 mg | ORAL_TABLET | ORAL | 0 refills | Status: DC | PRN

## 2022-06-19 MED ORDER — MORPHINE SULFATE ER 15 MG PO TBCR: 15.0000 mg | EXTENDED_RELEASE_TABLET | Freq: Two times a day (BID) | ORAL | 0 refills | Status: DC

## 2022-06-19 NOTE — Progress Notes (Signed)
ON PATHWAY REGIMEN - Ovarian  No Change  Continue With Treatment as Ordered.  Original Decision Date/Time: 07/19/2019 18:50     A cycle is every 21 days:     Paclitaxel      Carboplatin   **Always confirm dose/schedule in your pharmacy ordering system**  Patient Characteristics: Preoperative or Nonsurgical Candidate (Clinical Staging), Newly Diagnosed, Neoadjuvant Therapy followed by Surgery Therapeutic Status: Preoperative or Nonsurgical Candidate (Clinical Staging) BRCA Mutation Status: Absent AJCC T Category: cTX AJCC 8 Stage Grouping: IIIC AJCC N Category: cNX AJCC M Category: cM0 Therapy Plan: Neoadjuvant Therapy followed by Surgery Intent of Therapy: Curative Intent, Discussed with Patient

## 2022-06-19 NOTE — Progress Notes (Signed)
DISCONTINUE ON PATHWAY REGIMEN - Ovarian     A cycle is every 21 days:     Paclitaxel      Carboplatin   **Always confirm dose/schedule in your pharmacy ordering system**  REASON: Other Reason PRIOR TREATMENT: [id number]: Carboplatin AUC=6 + Paclitaxel 175 mg/m2 q21 Days x 2-4 Cycles TREATMENT RESPONSE: Partial Response (PR)    Patient Characteristics: Therapeutic Status: Preoperative or Nonsurgical Candidate (Clinical Staging)

## 2022-06-19 NOTE — Progress Notes (Signed)
START ON PATHWAY REGIMEN - Ovarian     A cycle is every 21 days:     Bevacizumab-xxxx      Paclitaxel      Carboplatin   **Always confirm dose/schedule in your pharmacy ordering system**  Patient Characteristics: Recurrent or Progressive Disease, Third Line, Platinum Sensitive and ? 6 Months Since Last Platinum Therapy BRCA Mutation Status: Absent Therapeutic Status: Recurrent or Progressive Disease Line of Therapy: Third Line  Intent of Therapy: Non-Curative / Palliative Intent, Discussed with Patient

## 2022-06-19 NOTE — Progress Notes (Signed)
[facility] [address], [place] [address]    Clinic Day:  06/19/2022  Referring physician: Corwin Levins, MD  Patient Care Team: Corwin Levins, MD as PCP - General (Internal Medicine) Parke Poisson, MD as PCP - Cardiology (Cardiology) Rachael Fee, MD as Attending Physician (Gastroenterology) Christia Reading, MD as Attending Physician (Otolaryngology) [place], Peter M, MD (Cardiology) Kerrin Champagne, MD (Inactive) as Consulting Physician (Orthopedic Surgery) Doreatha Massed, MD as Medical Oncologist (Oncology) Mickie Bail, RN as Oncology Nurse Navigator (Oncology) Soundra Pilon, LCSW as Social Worker (Licensed Clinical Social Worker) Colletta Maryland, RN as [facility] Care Management   ASSESSMENT & PLAN:   Assessment: 1.  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  Mass measures 6 x 5.8 cm and is partially calcified.  Separate solid component superior to this in the left periaortic region measures 3.5 cm, SUV 14.3.  Cystic component medial to the lower pole of the left kidney is without hypermetabolic activity, possibly a lymphocele. -CT-guided biopsy of the right retroperitoneal lymph node consistent with adenocarcinoma with psammoma bodies.  Morphology and immunotherapy consistent with ovarian serous carcinoma/primary peritoneal carcinoma. -Germline mutation testing was negative. -Foundation 1 testing shows MS-stable.  Loss of heterozygosity was less than 16%. -CA-125 346 on 06/23/2019. - 6 cycles of carboplatin and paclitaxel from 07/28/2019 through 11/17/2019 -CTAP on 10/04/2019 after 3 cycles of chemotherapy showed retroperitoneal adenopathy at the bifurcation measuring 5.2 x 2.9 cm, left para-aortic lymphadenopathy measuring 5 x 4.4 cm, both of them decreased in size when compared to most recent PET  scan.  CT scan report says that one of the lesion has gotten bigger but this was compared to CT scan from 04/02/2019. -PET scan on December 05, 2019 shows 4.8 x 4.2 cm fluid filled density in the left para-aortic region, non-FDG avid favoring benign retroperitoneal cyst versus lymphangioma.  Right/anterior para-aortic mixed cystic/solid lesions measuring 2.4 x 4.8 cm, SUV 2.5, previous SUV 19.5. -Robotic assisted laparoscopic total hysterectomy and bilateral salpingo-oophorectomy by Dr. Andrey Farmer on 01/03/2020. -Pathology showed soft tissue deformities 2 sites and psammomatous calcifications and chronic inflammation with no malignancy identified in the para-aortic lymph node.  Right salpingo-oophorectomy showed microscopic focus of residual adenocarcinoma less than 1 mm.  No malignancy in the left ovary.  [id number]. -As she had prior difficulty with chemotherapy, no further chemotherapy after surgery was recommended. - XRT to retroperitoneal nodal mass (IMRT) from 01/21/2021 through 02/01/2021.  10 fractions, 40 Gray. - Right ureteral stent placement on 03/13/2021 due to right hydronephrosis from retroperitoneal nodal mass. - CTAP (04/01/2021): Centrally necrotic lymph node has increased in size. - We discussed CT findings and reviewed images.  Last tumor marker was normal. - She complained of right lower quadrant pain radiating to the back which started on 05/11/2021 and ended on 05/13/2021 night. - 6 cycles of dose reduced carboplatin and paclitaxel from 05/22/2021 through 09/04/2021. - Germline mutation testing negative   Plan: 1.  Recurrent ovarian serous carcinoma: - CTAP on 06/14/2022: Stable 6.4 x 3.7 cm retroperitoneal necrotic lymphadenopathy.  There are enlarged retrocrural lymph nodes suggesting metastatic adenopathy.  Interval increase in number and size of retroperitoneal lymph nodes. - I have reviewed recent hospitalization records.  I have also reviewed the ER visit records from yesterday. - She is  reporting worsening pain in the right back to the groin. -  We have discussed restarting chemotherapy due to progression of disease and symptoms. - She is agreeable.  She is still considered platinum sensitive. - We will restart her on carboplatin and paclitaxel next week. - Recommend Guardant360 testing and baseline Ca1 25 at next visit prior to restarting chemotherapy. - If she tolerates well with full cycle, will consider adding bevacizumab with cycle 2.    2.  Hypertension: - Continue amlodipine daily.  Blood pressure is 155/62.  3.  Normocytic anemia: - Normocytic anemia from CKD and functional iron deficiency.  Latest CBC shows normal hemoglobin.  4.  Back and right groin pain: - She was taking oxycodone 5 mg every 6 hours.  She went to the ER on 06/18/2022 and was given Dilaudid 4 mg every 6 hours as needed.  She cannot tell which 1 works better. - We will start her on MS Contin 15 mg every 12 hours. - She will be instructed to take Dilaudid 4 mg every 3-4 hours as needed.  Hold for drowsiness. - We will also give her lidocaine patch to wear on the right back, 12 hours on/12 hours off.  No orders of the defined types were placed in this encounter.     I,Katie Daubenspeck,acting as a Neurosurgeon for Doreatha Massed, MD.,have documented all relevant documentation on the behalf of Doreatha Massed, MD,as directed by  Doreatha Massed, MD while in the presence of Doreatha Massed, MD.   I, Doreatha Massed MD, have reviewed the above documentation for accuracy and completeness, and I agree with the above.   Doreatha Massed, MD   6/6/20246:16 PM  CHIEF COMPLAINT:   Diagnosis: stage IIIc ovarian serous carcinoma/primary peritoneal carcinoma    Cancer Staging  No matching staging information was found for the patient.   Prior Therapy: 1. Carboplatin, paclitaxel and Aloxi x 6 cycles from 07/28/2019 to 11/17/2019. 2. Robotic assisted BSO with tumor debulking on  01/03/2020. 3. Completed 6 cycles of carboplatin and paclitaxel, last dose on 09/04/2021.  Current Therapy:  Surveillance    HISTORY OF PRESENT ILLNESS:   Oncology History  Malignant neoplasm of both ovaries  06/20/2019 Initial Diagnosis   Primary ovarian adenocarcinoma, unspecified laterality (HCC)   07/01/2019 Genetic Testing   Foundation One     07/11/2019 Genetic Testing   Negative genetic testing:  No pathogenic variants detected on the [facility] Multi-Cancer Panel. The report date is 07/11/2019.  The Multi-Cancer Panel offered by [facility] includes sequencing and/or deletion duplication testing of the following 85 genes: AIP, ALK, APC, ATM, AXIN2,BAP1,  BARD1, BLM, BMPR1A, BRCA1, BRCA2, BRIP1, CASR, CDC73, CDH1, CDK4, CDKN1B, CDKN1C, CDKN2A (p14ARF), CDKN2A (p16INK4a), CEBPA, CHEK2, CTNNA1, DICER1, DIS3L2, EGFR (c.2369C>T, p.Thr790Met variant only), EPCAM (Deletion/duplication testing only), FH, FLCN, GATA2, GPC3, GREM1 (Promoter region deletion/duplication testing only), HOXB13 (c.251G>A, p.Gly84Glu), HRAS, KIT, MAX, MEN1, MET, MITF (c.952G>A, p.Glu318Lys variant only), MLH1, MSH2, MSH3, MSH6, MUTYH, NBN, NF1, NF2, NTHL1, PALB2, PDGFRA, PHOX2B, PMS2, POLD1, POLE, POT1, PRKAR1A, PTCH1, PTEN, RAD50, RAD51C, RAD51D, RB1, RECQL4, RET, RNF43, RUNX1, SDHAF2, SDHA (sequence changes only), SDHB, SDHC, SDHD, SMAD4, SMARCA4, SMARCB1, SMARCE1, STK11, SUFU, TERC, TERT, TMEM127, TP53, TSC1, TSC2, VHL, WRN and WT1.   07/28/2019 - 09/04/2021 Chemotherapy   Patient is on Treatment Plan : OVARIAN Carboplatin (AUC 6) / Paclitaxel (175) q21d x 6 cycles        INTERVAL HISTORY:   Laura [name] is a 77 y.o. female seen for follow-up of stage III ovarian cancer.  She was recently hospitalized with intractable pain and a CT  scan showed new sites of metastatic disease.  She reports right back pain radiating to the groin.  She was again evaluated in the ER on 06/18/2022 and Dilaudid was given.  PAST MEDICAL HISTORY:   Past  Medical History: Past Medical History:  Diagnosis Date   Anemia    Anxiety    Arthritis    back of neck, bones spurs on neck   Cancer (HCC)    ovarian cancer   Cataract both eyes    Cervical disc disease    Diabetes mellitus Type 2    Family history of adverse reaction to anesthesia    sister slow to awaken   Family history of thyroid cancer    GERD (gastroesophageal reflux disease)    Heart murmur    History of COVID-19 06/2020   pain in side took paxlovid x 5 days all symptoms resolved   History of radiation therapy    abdominal lymph nodes 01/21/2021-02/01/2021  Dr Antony Blackbird   Hydronephrosis 08/24/2020   Right Kidney (stable per CT)   Hyperlipidemia    Hypertension    IBS (irritable bowel syndrome)    Left shoulder frozen with limited rom    Mucoid cyst of joint    right thumb   Neuropathy 03/12/2021   hands/fingers both hands numb and tingle @ times   Port-A-Cath in place 07/21/2019   Reflux    Sleep apnea 03/12/2021   has not used cpap in 3 years   Vertigo 12/2010   none since treated at [facility]   Wears glasses for reading    Wears partial dentures upper     Surgical History: Past Surgical History:  Procedure Laterality Date   BLADDER SURGERY     x 3 at [facility]   BREAST EXCISIONAL BIOPSY Bilateral    BREAST SURGERY     fibroid cyst removed ? over 10 yrs ago at [facility] per pt on 03-12-2021   CHOLECYSTECTOMY     yrs ago   COLONOSCOPY  07/02/2020   and 09-19-2016   CYSTOSCOPY W/ URETERAL STENT PLACEMENT Right 03/13/2021   Procedure: CYSTOSCOPY WITH RETROGRADE PYELOGRAM/URETERAL STENT PLACEMENT;  Surgeon: Noel Christmas, MD;  Location: [facility];  Service: Urology;  Laterality: Right;  30 MINS   CYSTOSCOPY W/ URETERAL STENT PLACEMENT Right 10/01/2021   Procedure: CYSTOSCOPY WITH RETROGRADE PYELOGRAM/URETERAL STENT REPLACEMENT;  Surgeon: Noel Christmas, MD;  Location: [facility] [place];  Service: Urology;  Laterality: Right;   CYSTOSCOPY W/  URETERAL STENT PLACEMENT Bilateral 03/18/2022   Procedure: CYSTOSCOPY WITH BILATERAL RETROGRADE PYELOGRAM/RIGHT URETERAL STENT EXCHANGE;  Surgeon: Noel Christmas, MD;  Location: [facility];  Service: Urology;  Laterality: Bilateral;  1HR   CYSTOSCOPY WITH RETROGRADE PYELOGRAM, URETEROSCOPY AND STENT PLACEMENT Right 05/06/2022   Procedure: CYSTOSCOPY WITH RIGHT RETROGRADE PYELOGRAM, AND RIGHT URETERAL STENT EXCHANGE;  Surgeon: Noel Christmas, MD;  Location: [facility];  Service: Urology;  Laterality: Right;   fibroids removed     breast (both breasts) age 34 and total of 3 surgeries   history of chemotherapy     6 rounds june 2021   IR IMAGING GUIDED PORT INSERTION  07/26/2019   right   MASS EXCISION Right 06/26/2016   Procedure: EXCISION MUCOID TUMOR RIGHT THUMB, IP RIGHT THUMB;  Surgeon: Cindee Salt, MD;  Location: [facility];  Service: Orthopedics;  Laterality: Right;   ROBOTIC ASSISTED BILATERAL SALPINGO OOPHERECTOMY N/A 01/03/2020   Procedure: XI ROBOTIC ASSISTED BILATERAL SALPINGO OOPHORECTOMY, RADICAL  TUMOR DEBULKING;  Surgeon: Adolphus Birchwood, MD;  Location: [facility];  Service: Gynecology;  Laterality: N/A;   ROBOTIC PELVIC AND PARA-AORTIC LYMPH NODE DISSECTION N/A 01/03/2020   Procedure: XI ROBOTIC PARA-AORTIC LYMPHADENECTOMY;  Surgeon: Adolphus Birchwood, MD;  Location: [facility];  Service: Gynecology;  Laterality: N/A;   VAGINAL HYSTERECTOMY     age late 58's    Social History: Social History   Socioeconomic History   Marital status: Divorced    Spouse name: Not on file   Number of children: 4   Years of education: 16   Highest education level: Bachelor's degree (e.g., BA, AB, BS)  Occupational History   Occupation: retired Photographer  Tobacco Use   Smoking status: Never   Smokeless tobacco: Never  Vaping Use   Vaping Use: Never used  Substance and Sexual Activity   Alcohol use: No    Alcohol/week: 0.0 standard drinks of alcohol   Drug use: No   Sexual  activity: Not Currently    Birth control/protection: Surgical  Other Topics Concern   Not on file  Social History Narrative   Not on file   Social Determinants of Health   Financial Resource Strain: Low Risk  (04/01/2022)   Overall Financial Resource Strain (CARDIA)    Difficulty of Paying Living Expenses: Not hard at all  Food Insecurity: Patient Declined (06/15/2022)   Hunger Vital Sign    Worried About Running Out of Food in the Last Year: Patient declined    Ran Out of Food in the Last Year: Patient declined  Transportation Needs: No Transportation Needs (06/15/2022)   PRAPARE - Administrator, Civil Service (Medical): No    Lack of Transportation (Non-Medical): No  Physical Activity: Unknown (04/01/2022)   Exercise Vital Sign    Days of Exercise per Week: 0 days    Minutes of Exercise per Session: Not on file  Recent Concern: Physical Activity - Inactive (04/01/2022)   Exercise Vital Sign    Days of Exercise per Week: 0 days    Minutes of Exercise per Session: 10 min  Stress: No Stress Concern Present (04/01/2022)   [employer] of Occupational Health - Occupational Stress Questionnaire    Feeling of Stress : Only a little  Recent Concern: Stress - Stress Concern Present (03/21/2022)   [employer] of Occupational Health - Occupational Stress Questionnaire    Feeling of Stress : Very much  Social Connections: Moderately Integrated (04/01/2022)   Social Connection and Isolation Panel [NHANES]    Frequency of Communication with Friends and Family: More than three times a week    Frequency of Social Gatherings with Friends and Family: Three times a week    Attends Religious Services: More than 4 times per year    Active Member of Clubs or Organizations: Yes    Attends Banker Meetings: More than 4 times per year    Marital Status: Divorced  Intimate Partner Violence: Not At Risk (06/15/2022)   Humiliation, Afraid, Rape, and Kick questionnaire    Fear  of Current or Ex-Partner: No    Emotionally Abused: No    Physically Abused: No    Sexually Abused: No    Family History: Family History  Problem Relation Age of Onset   Hypertension Mother    Diabetes Father    Hypertension Father    Diabetes Sister    Breast cancer Sister        stage 0   Hypertension Sister  Stroke Sister    Diabetes Maternal Aunt    Thyroid cancer Daughter 29   Hypertension Other    Colon cancer Neg Hx    Esophageal cancer Neg Hx    Stomach cancer Neg Hx    Rectal cancer Neg Hx    Endometrial cancer Neg Hx    Ovarian cancer Neg Hx     Current Medications:  Current Outpatient Medications:    acetaminophen (TYLENOL) 500 MG tablet, Take 1 tablet (500 mg total) by mouth every 6 (six) hours as needed., Disp: 30 tablet, Rfl: 0   amLODipine (NORVASC) 5 MG tablet, TAKE 1 TABLET (5 MG TOTAL) BY MOUTH DAILY., Disp: 90 tablet, Rfl: 3   Bismuth Subsalicylate (PEPTO-BISMOL MAX STRENGTH PO), Take 10 mLs by mouth every 8 (eight) hours as needed (IBS symptoms)., Disp: , Rfl:    Blood Glucose Monitoring Suppl (ONE TOUCH ULTRA 2) w/Device KIT, Use as directed, Disp: 1 each, Rfl: 0   Cholecalciferol (VITAMIN D3) 50 MCG (2000 UT) TABS, Take 2,000 Units by mouth daily., Disp: , Rfl:    docusate sodium (COLACE) 100 MG capsule, Take 1 capsule (100 mg total) by mouth every 12 (twelve) hours., Disp: 100 capsule, Rfl: 0   eszopiclone (LUNESTA) 2 MG TABS tablet, Take 1 tablet (2 mg total) by mouth at bedtime as needed for sleep. Take immediately before bedtime, Disp: 90 tablet, Rfl: 1   Famotidine (PEPCID AC PO), Take 1 tablet by mouth daily as needed (indigestion)., Disp: , Rfl:    glimepiride (AMARYL) 2 MG tablet, Take 0.5 tablets (1 mg total) by mouth daily before breakfast. 1/2 of 2 mg tab, Disp: 90 tablet, Rfl: 3   glucose blood (ONETOUCH ULTRA) test strip, USE TO CHECK BLOOD SUGARS TWO TIMES DAILY, Disp: 100 strip, Rfl: 3   hyoscyamine (ANASPAZ) 0.125 MG TBDP  disintergrating tablet, Place 1 tablet (0.125 mg total) under the tongue every 4 (four) hours as needed for up to 5 days for bladder spasms., Disp: 30 tablet, Rfl: 0   Lancets (ONETOUCH ULTRASOFT) lancets, 1 each by Other route as needed for other. Use as instructed, Disp: 100 each, Rfl: 3   lidocaine (LIDODERM) 5 %, Place 1 patch onto the skin daily. Remove & Discard patch within 12 hours or as directed by MD, Disp: 30 patch, Rfl: 2   loratadine (CLARITIN) 10 MG tablet, Take 10 mg by mouth daily., Disp: , Rfl:    mirtazapine (REMERON) 15 MG tablet, Take 1 tablet (15 mg total) by mouth at bedtime., Disp: 90 tablet, Rfl: 3   oxyCODONE (ROXICODONE) 5 MG immediate release tablet, Take 1 tablet (5 mg total) by mouth every 6 (six) hours as needed for up to 5 days., Disp: 20 tablet, Rfl: 0   solifenacin (VESICARE) 5 MG tablet, Take 1 tablet (5 mg total) by mouth daily., Disp: 90 tablet, Rfl: 3   vitamin B-12 (CYANOCOBALAMIN) 1000 MCG tablet, Take 1,000 mcg by mouth daily., Disp: , Rfl:    Wheat Dextrin (BENEFIBER) CHEW, Chew 3 tablets by mouth daily., Disp: , Rfl:    HYDROmorphone (DILAUDID) 4 MG tablet, Take 1 tablet (4 mg total) by mouth every 4 (four) hours as needed for severe pain., Disp: 180 tablet, Rfl: 0   morphine (MS CONTIN) 15 MG 12 hr tablet, Take 1 tablet (15 mg total) by mouth every 12 (twelve) hours., Disp: 60 tablet, Rfl: 0   Allergies: Allergies  Allergen Reactions   Ciprofloxacin Swelling    Torn tendon  Hydrocodone Bit-Homatrop Mbr Other (See Comments)    Vertigo *pt strongly prefers to never take* took 3 years to recover from   Prednisone Anxiety    *pt strongly prefers to never be given prednisone*    Sulfa Antibiotics Hives, Itching and Swelling    Tongue swells   Alfuzosin Other (See Comments)    Weakness and fatigue   Codeine Itching   Crestor [Rosuvastatin Calcium] Other (See Comments)    Did something to memory     Doxycycline Other (See Comments)    Severe rectal  Gas.   Gabapentin     disoriented   Keflex [Cephalexin] Diarrhea and Nausea And Vomiting   Myrbetriq [facility]] Swelling    Swelling of legs and feet   Naproxen Other (See Comments)    Stomach cramps/loud gas   Statins     Muscle weakness    REVIEW OF SYSTEMS:   Review of Systems  Constitutional:  Negative for chills, fatigue and fever.  HENT:   Negative for lump/mass, mouth sores, nosebleeds, sore throat and trouble swallowing.   Eyes:  Negative for eye problems.  Respiratory:  Positive for shortness of breath. Negative for cough.   Cardiovascular:  Negative for chest pain and palpitations.  Gastrointestinal:  Positive for constipation and nausea. Negative for abdominal pain, diarrhea and vomiting.  Genitourinary:  Negative for bladder incontinence, difficulty urinating, dysuria, frequency, hematuria and nocturia.   Musculoskeletal:  Positive for back pain. Negative for arthralgias, flank pain, myalgias and neck pain.  Skin:  Negative for itching and rash.  Neurological:  Positive for dizziness. Negative for headaches and numbness.  Hematological:  Does not bruise/bleed easily.  Psychiatric/Behavioral:  Positive for sleep disturbance. Negative for suicidal ideas.   All other systems reviewed and are negative.    VITALS:   Blood pressure (!) 158/66, pulse 88, temperature 98.9 F (37.2 C), temperature source Tympanic, resp. rate 18, weight 189 lb 14.4 oz (86.1 kg), SpO2 98 %.  Wt Readings from Last 3 Encounters:  06/19/22 189 lb 14.4 oz (86.1 kg)  06/15/22 184 lb 1.4 oz (83.5 kg)  06/14/22 184 lb 1.4 oz (83.5 kg)    Body mass index is 33.64 kg/m.  Performance status (ECOG): 1 - Symptomatic but completely ambulatory  PHYSICAL EXAM:   Physical Exam Vitals and nursing note reviewed. Exam conducted with a chaperone present.  Constitutional:      Appearance: Normal appearance.  Cardiovascular:     Rate and Rhythm: Normal rate and regular rhythm.     Pulses: Normal  pulses.     Heart sounds: Normal heart sounds.  Pulmonary:     Effort: Pulmonary effort is normal.     Breath sounds: Normal breath sounds.  Abdominal:     Palpations: Abdomen is soft. There is no hepatomegaly, splenomegaly or mass.     Tenderness: There is no abdominal tenderness.  Musculoskeletal:     Right lower leg: No edema.     Left lower leg: Edema present.  Lymphadenopathy:     Cervical: No cervical adenopathy.     Right cervical: No superficial, deep or posterior cervical adenopathy.    Left cervical: No superficial, deep or posterior cervical adenopathy.     Upper Body:     Right upper body: No supraclavicular or axillary adenopathy.     Left upper body: No supraclavicular or axillary adenopathy.  Neurological:     General: No focal deficit present.     Mental Status: She is alert and  oriented to person, place, and time.  Psychiatric:        Mood and Affect: Mood normal.        Behavior: Behavior normal.     LABS:      Latest Ref Rng & Units 06/18/2022    8:00 AM 06/16/2022    5:14 AM 06/15/2022   12:16 PM  CBC  WBC 4.0 - 10.5 K/uL 7.3  6.0  8.7   Hemoglobin 12.0 - 15.0 g/dL 16.1  09.6  04.5   Hematocrit 36.0 - 46.0 % 41.2  33.7  38.3   Platelets 150 - 400 K/uL 200  191  219       Latest Ref Rng & Units 06/18/2022    8:00 AM 06/17/2022    8:44 AM 06/16/2022    5:14 AM  CMP  Glucose 70 - 99 mg/dL 409  96  811   BUN 8 - 23 mg/dL 29  34  27   Creatinine 0.44 - 1.00 mg/dL 9.14  7.82  9.56   Sodium 135 - 145 mmol/L 132  138  134   Potassium 3.5 - 5.1 mmol/L 4.8  4.1  4.6   Chloride 98 - 111 mmol/L 100  106  108   CO2 22 - 32 mmol/L 21  25  20    Calcium 8.9 - 10.3 mg/dL 9.7  21.3  9.3   Total Protein 6.5 - 8.1 g/dL 8.1     Total Bilirubin 0.3 - 1.2 mg/dL 0.9     Alkaline Phos 38 - 126 U/L 58     AST 15 - 41 U/L 29     ALT 0 - 44 U/L 11        No results found for: "CEA1", "CEA" / No results found for: "CEA1", "CEA" No results found for: "PSA1" No results found  for: "CAN199" Lab Results  Component Value Date   CAN125 7.5 04/10/2022    No results found for: "TOTALPROTELP", "ALBUMINELP", "A1GS", "A2GS", "BETS", "BETA2SER", "GAMS", "MSPIKE", "SPEI" Lab Results  Component Value Date   TIBC 353 11/12/2021   FERRITIN 48 11/12/2021   IRONPCTSAT 12 11/12/2021   IRONPCTSAT 17.9 (L) 08/25/2018   Lab Results  Component Value Date   LDH 148 04/05/2019     STUDIES:   CT Renal Stone Study  Result Date: 06/18/2022 CLINICAL DATA:  Right flank pain.  History of ovarian cancer. EXAM: CT ABDOMEN AND PELVIS WITHOUT CONTRAST TECHNIQUE: Multidetector CT imaging of the abdomen and pelvis was performed following the standard protocol without IV contrast. RADIATION DOSE REDUCTION: This exam was performed according to the departmental dose-optimization program which includes automated exposure control, adjustment of the mA and/or kV according to patient size and/or use of iterative reconstruction technique. COMPARISON:  CT scan of June 14, 2022. FINDINGS: Lower chest: Moderate size sliding-type hiatal hernia. Minimal bibasilar subsegmental atelectasis. Hepatobiliary: No focal liver abnormality is seen. Status post cholecystectomy. No biliary dilatation. Pancreas: Unremarkable. No pancreatic ductal dilatation or surrounding inflammatory changes. Spleen: Normal in size without focal abnormality. Adrenals/Urinary Tract: Adrenal glands appear normal. Right-sided ureteral stent is noted in grossly good position. Mild right hydronephrosis is noted. Left kidney and ureter are unremarkable. Urinary bladder is unremarkable. Stomach/Bowel: Stomach is within normal limits. Appendix appears normal. No evidence of bowel wall thickening, distention, or inflammatory changes. Vascular/Lymphatic: Aortic atherosclerosis. As noted on prior CT scan, retroperitoneal adenopathy measuring 5.6 x 3.9 cm is noted highly concerning for necrotic adenopathy and metastatic disease. 13 x 10 mm  right  retrocrural lymph node is noted which was also noted on prior exam. Reproductive: Status post hysterectomy. No adnexal masses. Other: No abdominal wall hernia or abnormality. No abdominopelvic ascites. Musculoskeletal: No acute or significant osseous findings. IMPRESSION: Right-sided ureteral stent is noted in grossly good position. Mild right hydronephrosis is noted. Retrocrural and retroperitoneal adenopathy concerning for metastatic disease is again noted as described on recent prior CT scan. Moderate size sliding-type hiatal hernia. Aortic Atherosclerosis (ICD10-I70.0). Electronically Signed   By: Lupita Raider M.D.   On: 06/18/2022 09:39   VAS [place] LOWER EXTREMITY VENOUS (DVT)  Result Date: 06/16/2022  Lower Venous DVT Study Patient Name:  LOLANDA PETROVSKI  Date of Exam:   06/15/2022 Medical Rec #: [id number]      Accession #:    [id number] Date of Birth: 19-Aug-1945      Patient Gender: F Patient Age:   54 years Exam Location[facility]  [facility] Procedure:      VAS [place] LOWER EXTREMITY VENOUS (DVT) Referring Phys: DAVID ORTIZ --------------------------------------------------------------------------------  Indications: Swelling.  Risk Factors: Cancer. Limitations: Poor ultrasound/tissue interface. Comparison Study: No prior studies. Performing Technologist: Chanda Busing RVT  Examination Guidelines: A complete evaluation includes B-mode imaging, spectral Doppler, color Doppler, and power Doppler as needed of all accessible portions of each vessel. Bilateral testing is considered an integral part of a complete examination. Limited examinations for reoccurring indications may be performed as noted. The reflux portion of the exam is performed with the patient in reverse Trendelenburg.  +---------+---------------+---------+-----------+----------+--------------+ RIGHT    CompressibilityPhasicitySpontaneityPropertiesThrombus Aging +---------+---------------+---------+-----------+----------+--------------+ CFV       Full           Yes      Yes                                 +---------+---------------+---------+-----------+----------+--------------+ SFJ      Full                                                        +---------+---------------+---------+-----------+----------+--------------+ FV Prox  Full                                                        +---------+---------------+---------+-----------+----------+--------------+ FV Mid   Full                                                        +---------+---------------+---------+-----------+----------+--------------+ FV DistalFull                                                        +---------+---------------+---------+-----------+----------+--------------+ PFV      Full                                                        +---------+---------------+---------+-----------+----------+--------------+  POP      Full           Yes      Yes                                 +---------+---------------+---------+-----------+----------+--------------+ PTV      Full                                                        +---------+---------------+---------+-----------+----------+--------------+ PERO     Full                                                        +---------+---------------+---------+-----------+----------+--------------+   +---------+---------------+---------+-----------+----------+--------------+ LEFT     CompressibilityPhasicitySpontaneityPropertiesThrombus Aging +---------+---------------+---------+-----------+----------+--------------+ CFV      Full           Yes      Yes                                 +---------+---------------+---------+-----------+----------+--------------+ SFJ      Full                                                        +---------+---------------+---------+-----------+----------+--------------+ FV Prox  Full                                                         +---------+---------------+---------+-----------+----------+--------------+ FV Mid   Full           Yes      Yes                                 +---------+---------------+---------+-----------+----------+--------------+ FV Distal               Yes      Yes                                 +---------+---------------+---------+-----------+----------+--------------+ PFV      Full                                                        +---------+---------------+---------+-----------+----------+--------------+ POP      Full           Yes      Yes                                 +---------+---------------+---------+-----------+----------+--------------+  PTV      Full                                                        +---------+---------------+---------+-----------+----------+--------------+ PERO     Full                                                        +---------+---------------+---------+-----------+----------+--------------+     Summary: RIGHT: - There is no evidence of deep vein thrombosis in the lower extremity. However, portions of this examination were limited- see technologist comments above.  - No cystic structure found in the popliteal fossa.  LEFT: - There is no evidence of deep vein thrombosis in the lower extremity. However, portions of this examination were limited- see technologist comments above.  - No cystic structure found in the popliteal fossa.  *See table(s) above for measurements and observations. Electronically signed by Lemar Livings MD on 06/16/2022 at 4:38:36 PM.    Final    CT ABDOMEN PELVIS W CONTRAST  Result Date: 06/14/2022 CLINICAL DATA:  Right flank pain EXAM: CT ABDOMEN AND PELVIS WITH CONTRAST TECHNIQUE: Multidetector CT imaging of the abdomen and pelvis was performed using the standard protocol following bolus administration of intravenous contrast. RADIATION DOSE REDUCTION: This exam was performed according to the  departmental dose-optimization program which includes automated exposure control, adjustment of the mA and/or kV according to patient size and/or use of iterative reconstruction technique. CONTRAST:  80mL OMNIPAQUE IOHEXOL 300 MG/ML  SOLN COMPARISON:  04/10/2022 FINDINGS: Lower chest: Visualized lower lung fields are clear. Scattered coronary artery calcifications are seen. Hepatobiliary: There are ill-defined foci of decreased density in liver close to bony bodies and falciform ligament, possibly due to aberrant venous drainage or focal fatty infiltration. There is no dilation of bile ducts. Surgical clips are seen in gallbladder fossa. Pancreas: No focal abnormalities are seen. Spleen: Unremarkable. Adrenals/Urinary Tract: Adrenals are unremarkable. There is moderate right hydronephrosis. Right ureteral stent is seen. There is mild left hydronephrosis. There is no wall thickening and urinary bladder lower portion of the urinary bladder is extending below the level of pubic symphysis suggesting possible cystocele. Stomach/Bowel: Moderate sized fixed hiatal hernia is seen. Small bowel loops are not dilated. Appendix is not distinctly seen. There is no pericecal inflammation. There is no significant wall thickening in colon. Few diverticula are seen in colon without signs of focal acute diverticulitis. Vascular/Lymphatic: Scattered arterial calcifications are seen in aorta and its major branches. There are few enlarged retrocrural lymph nodes measuring up to 9 mm in short axis. There is para-aortic lymph node on the left side slightly below the left renal artery measuring 12 mm in short axis. There is 6.4 by 3.7 cm low-density mass in retroperitoneum at the level of aortic bifurcation suggesting necrotic lymphadenopathy. There is 2.4 x 2.2 cm soft tissue density in the anterior aspect of the right psoas muscle at the level of midportion of right kidney, possibly new lymphadenopathy. Reproductive: Uterus is not seen.  No dominant adnexal masses are seen. Other: There is no ascites or pneumoperitoneum. Musculoskeletal: There is first-degree anterolisthesis at the L4-L5 level. Degenerative changes are noted in lumbar  spine, more so in the facet joints in lower lumbar spine. There is spinal stenosis and encroachment of neural foramina at L4-L5 level. IMPRESSION: There is no evidence of intestinal obstruction or pneumoperitoneum. There is moderate right hydronephrosis. Right ureteral stent is noted. There is mild left hydronephrosis. There is 6.4 x 3.7 cm low-density lesion in the retroperitoneal location suggesting necrotic lymphadenopathy. There is interval increase in number and size of retroperitoneal lymph nodes suggesting progression of metastatic disease. There are enlarged retrocrural lymph nodes suggesting metastatic lymphadenopathy. Moderate sized fixed hiatal hernia. There are faint areas of decreased density in liver may suggest normal variation due to aberrant venous drainage or focal fatty infiltration. Lumbar spondylosis. Electronically Signed   By: Ernie Avena M.D.   On: 06/14/2022 12:39

## 2022-06-19 NOTE — Patient Instructions (Addendum)
[facility] at [facility] [place] Discharge Instructions   You were seen and examined today by Dr. Ellin Saba.  He discussed with you restarting treatment as the cancer is growing and you are now having significant pain related to the cancer.   We will plan to restart you on the same chemotherapy drugs you had before. It is two drugs (Taxol and carboplatin) given in the clinic every 3 weeks. You will also receive the white blood cell booster shot two days after treatment like before.   Return as scheduled.    Thank you for choosing [facility] at [facility] to provide your oncology and hematology care.  To afford each patient quality time with our provider, please arrive at least 15 minutes before your scheduled appointment time.   If you have a lab appointment with the Cancer Center please come in thru the Main Entrance and check in at the main information desk.  You need to re-schedule your appointment should you arrive 10 or more minutes late.  We strive to give you quality time with our providers, and arriving late affects you and other patients whose appointments are after yours.  Also, if you no show three or more times for appointments you may be dismissed from the clinic at the providers discretion.     Again, thank you for choosing [facility].  Our hope is that these requests will decrease the amount of time that you wait before being seen by our physicians.       _____________________________________________________________  Should you have questions after your visit to [facility], please contact our office at [phone] and follow the prompts.  Our office hours are 8:00 a.m. and 4:30 p.m. Monday - Friday.  Please note that voicemails left after 4:00 p.m. may not be returned until the following business day.  We are closed weekends and major holidays.  You do have access to a nurse 24-7, just call the main number to  the clinic [phone] and do not press any options, hold on the line and a nurse will answer the phone.    For prescription refill requests, have your pharmacy contact our office and allow 72 hours.    Due to Covid, you will need to wear a mask upon entering the hospital. If you do not have a mask, a mask will be given to you at the Main Entrance upon arrival. For doctor visits, patients may have 1 support person age 79 or older with them. For treatment visits, patients can not have anyone with them due to social distancing guidelines and our immunocompromised population.

## 2022-06-20 ENCOUNTER — Other Ambulatory Visit: Payer: Self-pay

## 2022-06-20 NOTE — Progress Notes (Signed)
The following biosimilar Mvasi ([id number]) has been selected for use in this patient.   Pryor Ochoa, PharmD 06-20-22

## 2022-06-23 ENCOUNTER — Ambulatory Visit: Payer: Self-pay | Admitting: Licensed Clinical Social Worker

## 2022-06-23 ENCOUNTER — Other Ambulatory Visit: Payer: Self-pay

## 2022-06-23 ENCOUNTER — Ambulatory Visit: Payer: 59 | Admitting: Hematology

## 2022-06-23 DIAGNOSIS — C563 Malignant neoplasm of bilateral ovaries: Secondary | ICD-10-CM

## 2022-06-23 NOTE — Patient Outreach (Signed)
  Care Coordination  Follow Up Visit Note   06/23/2022 Name: Laura [name] MRN: [id number] DOB: 1945-03-06  Laura [name] is a 77 y.o. year old female who sees Corwin Levins, MD for primary care. I spoke with  Laura [name] by phone today.  What matters to the patients health and wellness today?  Understanding her health and managing symptoms of anxiety  Patient reports she will be starting cancer treatment tomorrow.  Report this is what's been causing all of her pain   Goals Addressed             This Visit's Progress    Care Coordination Activiites Reduce symptoms of anxiety related to health concerns       Activities and task to complete in order to accomplish goals.   Continue with relaxed breathing 3 times daily; this seems to be helping when you start to feel overwhelmed Keep all upcoming appointment discussed today Continue with compliance of taking medication prescribed by Doctor           SDOH assessments and interventions completed:  No  Care Coordination Interventions:  Yes, provided  Interventions Today    Flowsheet Row Most Recent Value  Chronic Disease   Chronic disease during today's visit Diabetes, Hypertension (HTN), Chronic Kidney Disease/End Stage Renal Disease (ESRD)  General Interventions   Doctor Visits Discussed/Reviewed Doctor Visits Reviewed  [up coming appointments]  Communication with RN  Arvil Persons with RN  Care manager]  Education Interventions   Provided Verbal Education On Mental Health/Coping with Illness  Mental Health Interventions   Mental Health Discussed/Reviewed Mental Health Reviewed, Coping Strategies, Anxiety  [active listening,  emotional support, ]       Follow up plan: Follow up call scheduled for 06/25/22    Encounter Outcome:  Pt. Visit Completed   [name], LCSW Social Work Care Coordination  [facility] [employer] [phone]

## 2022-06-23 NOTE — Patient Instructions (Signed)
Social Work Visit Information  Thank you for taking time to visit with me today. Please don't hesitate to contact me if I can be of assistance to you.   Following are the goals we discussed today:   Goals Addressed             This Visit's Progress    Care Coordination Activiites Reduce symptoms of anxiety related to health concerns       Activities and task to complete in order to accomplish goals.   Continue with relaxed breathing 3 times daily; this seems to be helping when you start to feel overwhelmed Keep all upcoming appointment discussed today Continue with compliance of taking medication prescribed by Doctor          Our next appointment is by telephone on 06/25/22    Please call the care guide team at [phone] if you need to cancel or reschedule your appointment.   If you or anyone you know are experiencing a Mental Health or Behavioral Health Crisis or need someone to talk to, please call the Suicide and Crisis Lifeline: 988 call the [place] National Suicide Prevention Lifeline: 6010396518 or TTY: 907 618 1478 TTY 3855648640) to talk to a trained counselor call 1-800-273-TALK (toll free, 24 hour hotline)   Patient verbalizes understanding of instructions and care plan provided today and agrees to view in MyChart. Active MyChart status and patient understanding of how to access instructions and care plan via MyChart confirmed with patient.     [name], LCSW Social Work Care Coordination  [facility] [employer] [phone]

## 2022-06-24 ENCOUNTER — Inpatient Hospital Stay: Payer: 59

## 2022-06-24 ENCOUNTER — Telehealth: Payer: Self-pay

## 2022-06-24 VITALS — BP 163/76 | HR 88 | Temp 96.8°F | Resp 16

## 2022-06-24 DIAGNOSIS — C563 Malignant neoplasm of bilateral ovaries: Secondary | ICD-10-CM

## 2022-06-24 DIAGNOSIS — Z95828 Presence of other vascular implants and grafts: Secondary | ICD-10-CM

## 2022-06-24 DIAGNOSIS — D72829 Elevated white blood cell count, unspecified: Secondary | ICD-10-CM | POA: Diagnosis not present

## 2022-06-24 DIAGNOSIS — C569 Malignant neoplasm of unspecified ovary: Secondary | ICD-10-CM

## 2022-06-24 DIAGNOSIS — D630 Anemia in neoplastic disease: Secondary | ICD-10-CM | POA: Diagnosis not present

## 2022-06-24 DIAGNOSIS — I890 Lymphedema, not elsewhere classified: Secondary | ICD-10-CM | POA: Diagnosis not present

## 2022-06-24 DIAGNOSIS — D631 Anemia in chronic kidney disease: Secondary | ICD-10-CM | POA: Diagnosis not present

## 2022-06-24 DIAGNOSIS — N189 Chronic kidney disease, unspecified: Secondary | ICD-10-CM | POA: Diagnosis not present

## 2022-06-24 DIAGNOSIS — I129 Hypertensive chronic kidney disease with stage 1 through stage 4 chronic kidney disease, or unspecified chronic kidney disease: Secondary | ICD-10-CM | POA: Diagnosis not present

## 2022-06-24 DIAGNOSIS — K5903 Drug induced constipation: Secondary | ICD-10-CM | POA: Diagnosis not present

## 2022-06-24 DIAGNOSIS — C772 Secondary and unspecified malignant neoplasm of intra-abdominal lymph nodes: Secondary | ICD-10-CM | POA: Diagnosis not present

## 2022-06-24 DIAGNOSIS — D696 Thrombocytopenia, unspecified: Secondary | ICD-10-CM | POA: Diagnosis not present

## 2022-06-24 DIAGNOSIS — Z5111 Encounter for antineoplastic chemotherapy: Secondary | ICD-10-CM | POA: Diagnosis not present

## 2022-06-24 DIAGNOSIS — F32A Depression, unspecified: Secondary | ICD-10-CM | POA: Diagnosis not present

## 2022-06-24 DIAGNOSIS — D509 Iron deficiency anemia, unspecified: Secondary | ICD-10-CM | POA: Diagnosis not present

## 2022-06-24 DIAGNOSIS — G893 Neoplasm related pain (acute) (chronic): Secondary | ICD-10-CM | POA: Diagnosis not present

## 2022-06-24 DIAGNOSIS — Z5189 Encounter for other specified aftercare: Secondary | ICD-10-CM | POA: Diagnosis not present

## 2022-06-24 LAB — COMPREHENSIVE METABOLIC PANEL
ALT: 11 U/L (ref 0–44)
AST: 18 U/L (ref 15–41)
Albumin: 3.5 g/dL (ref 3.5–5.0)
Alkaline Phosphatase: 59 U/L (ref 38–126)
Anion gap: 8 (ref 5–15)
BUN: 22 mg/dL (ref 8–23)
CO2: 22 mmol/L (ref 22–32)
Calcium: 9.4 mg/dL (ref 8.9–10.3)
Chloride: 103 mmol/L (ref 98–111)
Creatinine, Ser: 1.25 mg/dL — ABNORMAL HIGH (ref 0.44–1.00)
GFR, Estimated: 44 mL/min — ABNORMAL LOW (ref >60)
Glucose, Bld: 129 mg/dL — ABNORMAL HIGH (ref 70–99)
Potassium: 3.9 mmol/L (ref 3.5–5.1)
Sodium: 133 mmol/L — ABNORMAL LOW (ref 135–145)
Total Bilirubin: 0.2 mg/dL — ABNORMAL LOW (ref 0.3–1.2)
Total Protein: 6.9 g/dL (ref 6.5–8.1)

## 2022-06-24 LAB — CBC WITH DIFFERENTIAL/PLATELET
Abs Immature Granulocytes: 0.02 10*3/uL (ref 0.00–0.07)
Basophils Absolute: 0 10*3/uL (ref 0.0–0.1)
Basophils Relative: 0 %
Eosinophils Absolute: 0.1 10*3/uL (ref 0.0–0.5)
Eosinophils Relative: 2 %
HCT: 34.4 % — ABNORMAL LOW (ref 36.0–46.0)
Hemoglobin: 11.1 g/dL — ABNORMAL LOW (ref 12.0–15.0)
Immature Granulocytes: 0 %
Lymphocytes Relative: 18 %
Lymphs Abs: 1.2 10*3/uL (ref 0.7–4.0)
MCH: 28.8 pg (ref 26.0–34.0)
MCHC: 32.3 g/dL (ref 30.0–36.0)
MCV: 89.1 fL (ref 80.0–100.0)
Monocytes Absolute: 0.6 10*3/uL (ref 0.1–1.0)
Monocytes Relative: 9 %
Neutro Abs: 4.7 10*3/uL (ref 1.7–7.7)
Neutrophils Relative %: 71 %
Platelets: 214 10*3/uL (ref 150–400)
RBC: 3.86 MIL/uL — ABNORMAL LOW (ref 3.87–5.11)
RDW: 15.1 % (ref 11.5–15.5)
WBC: 6.6 10*3/uL (ref 4.0–10.5)
nRBC: 0 % (ref 0.0–0.2)

## 2022-06-24 LAB — MAGNESIUM: Magnesium: 1.9 mg/dL (ref 1.7–2.4)

## 2022-06-24 MED ORDER — SODIUM CHLORIDE 0.9% FLUSH
10.0000 mL | INTRAVENOUS | Status: DC | PRN
  Administered 2022-06-24: 10 mL via INTRAVENOUS

## 2022-06-24 MED ORDER — SODIUM CHLORIDE 0.9 % IV SOLN: Freq: Once | INTRAVENOUS | Status: AC

## 2022-06-24 MED ORDER — FAMOTIDINE IN NACL 20-0.9 MG/50ML-% IV SOLN
20.0000 mg | Freq: Once | INTRAVENOUS | Status: AC
  Administered 2022-06-24: 20 mg via INTRAVENOUS
  Filled 2022-06-24: qty 50

## 2022-06-24 MED ORDER — SODIUM CHLORIDE 0.9 % IV SOLN
150.0000 mg | Freq: Once | INTRAVENOUS | Status: AC
  Administered 2022-06-24: 150 mg via INTRAVENOUS
  Filled 2022-06-24: qty 150

## 2022-06-24 MED ORDER — DIPHENHYDRAMINE HCL 50 MG/ML IJ SOLN: 50.0000 mg | Freq: Once | INTRAMUSCULAR | Status: DC

## 2022-06-24 MED ORDER — SODIUM CHLORIDE 0.9 % IV SOLN
381.0000 mg | Freq: Once | INTRAVENOUS | Status: AC
  Administered 2022-06-24: 380 mg via INTRAVENOUS
  Filled 2022-06-24: qty 38

## 2022-06-24 MED ORDER — HEPARIN SOD (PORK) LOCK FLUSH 100 UNIT/ML IV SOLN
500.0000 [IU] | Freq: Once | INTRAVENOUS | Status: AC | PRN
  Administered 2022-06-24: 500 [IU]

## 2022-06-24 MED ORDER — SODIUM CHLORIDE 0.9% FLUSH
10.0000 mL | INTRAVENOUS | Status: DC | PRN
  Administered 2022-06-24: 10 mL

## 2022-06-24 MED ORDER — SODIUM CHLORIDE 0.9 % IV SOLN
140.0000 mg/m2 | Freq: Once | INTRAVENOUS | Status: AC
  Administered 2022-06-24: 276 mg via INTRAVENOUS
  Filled 2022-06-24: qty 46

## 2022-06-24 MED ORDER — PALONOSETRON HCL INJECTION 0.25 MG/5ML
0.2500 mg | Freq: Once | INTRAVENOUS | Status: AC
  Administered 2022-06-24: 0.25 mg via INTRAVENOUS
  Filled 2022-06-24: qty 5

## 2022-06-24 MED ORDER — SODIUM CHLORIDE 0.9 % IV SOLN
10.0000 mg | Freq: Once | INTRAVENOUS | Status: AC
  Administered 2022-06-24: 10 mg via INTRAVENOUS
  Filled 2022-06-24: qty 10

## 2022-06-24 MED ORDER — CETIRIZINE HCL 10 MG/ML IV SOLN
10.0000 mg | Freq: Once | INTRAVENOUS | Status: AC
  Administered 2022-06-24: 10 mg via INTRAVENOUS
  Filled 2022-06-24: qty 1

## 2022-06-24 NOTE — Telephone Encounter (Signed)
Transition Care Management Unsuccessful Follow-up Telephone Call  Date of discharge and from where:  06/18/2022 [facility]  Attempts:  1st Attempt  Reason for unsuccessful TCM follow-up call:  No answer/busy  Laura [name] [facility]  [facility] Population Health Community Resource Care Guide   [email or web address]  ?? [phone]   Website: [email or web address]  [email or web address]

## 2022-06-24 NOTE — Progress Notes (Signed)
Patient presents today for chemotherapy infusion of Taxol and Carboplatin. New consent signed. Patient is in satisfactory condition with no new complaints voiced.  Vital signs are stable.  Labs reviewed and all labs are within treatment parameters.  We will proceed with treatment per MD orders.    Patient tolerated treatment well with no complaints voiced.  Patient left via wheelchair in stable condition.  Vital signs stable at discharge.  Follow up as scheduled.

## 2022-06-24 NOTE — Progress Notes (Signed)
Discontinue diphenhydramine from oncology treatment plan --> Add Quzyttir (cetirizine) 10 mg IVPush x 1 as premedication for oncology treatment plan.  T.O. Dr Carilyn Goodpasture, PharmD  Pharmacist Chemotherapy Monitoring - Initial Assessment    Anticipated start date: 06/24/22   The following has been reviewed per standard work regarding the patient's treatment regimen: The patient's diagnosis, treatment plan and drug doses, and organ/hematologic function Lab orders and baseline tests specific to treatment regimen  The treatment plan start date, drug sequencing, and pre-medications Prior authorization status  Patient's documented medication list, including drug-drug interaction screen and prescriptions for anti-emetics and supportive care specific to the treatment regimen The drug concentrations, fluid compatibility, administration routes, and timing of the medications to be used The patient's access for treatment and lifetime cumulative dose history, if applicable  The patient's medication allergies and previous infusion related reactions, if applicable   Changes made to treatment plan:  pre-medications cetirizine IV  Follow up needed:  N/A     Stephens Shire, [facility], 06/24/2022  10:23 AM

## 2022-06-24 NOTE — Patient Instructions (Signed)
[facility] [place]  Discharge Instructions: Thank you for choosing [facility] to provide your oncology and hematology care.  If you have a lab appointment with the Cancer Center - please note that after April 8th, 2024, all labs will be drawn in the cancer center.  You do not have to check in or register with the main entrance as you have in the past but will complete your check-in in the cancer center.  Wear comfortable clothing and clothing appropriate for easy access to any Portacath or PICC line.   We strive to give you quality time with your provider. You may need to reschedule your appointment if you arrive late (15 or more minutes).  Arriving late affects you and other patients whose appointments are after yours.  Also, if you miss three or more appointments without notifying the office, you may be dismissed from the clinic at the provider's discretion.      For prescription refill requests, have your pharmacy contact our office and allow 72 hours for refills to be completed.    Today you received the following chemotherapy and/or immunotherapy agents taxol and carboplatin.  Carboplatin Injection What is this medication? CARBOPLATIN (KAR boe pla tin) treats some types of cancer. It works by slowing down the growth of cancer cells. This medicine may be used for other purposes; ask your health care provider or pharmacist if you have questions. COMMON BRAND NAME(S): Paraplatin What should I tell my care team before I take this medication? They need to know if you have any of these conditions: Blood disorders Hearing problems Kidney disease Recent or ongoing radiation therapy An unusual or allergic reaction to carboplatin, cisplatin, other medications, foods, dyes, or preservatives Pregnant or trying to get pregnant Breast-feeding How should I use this medication? This medication is injected into a vein. It is given by your care team in a hospital or clinic  setting. Talk to your care team about the use of this medication in children. Special care may be needed. Overdosage: If you think you have taken too much of this medicine contact a poison control center or emergency room at once. NOTE: This medicine is only for you. Do not share this medicine with others. What if I miss a dose? Keep appointments for follow-up doses. It is important not to miss your dose. Call your care team if you are unable to keep an appointment. What may interact with this medication? Medications for seizures Some antibiotics, such as amikacin, gentamicin, neomycin, streptomycin, tobramycin Vaccines This list may not describe all possible interactions. Give your health care provider a list of all the medicines, herbs, non-prescription drugs, or dietary supplements you use. Also tell them if you smoke, drink alcohol, or use illegal drugs. Some items may interact with your medicine. What should I watch for while using this medication? Your condition will be monitored carefully while you are receiving this medication. You may need blood work while taking this medication. This medication may make you feel generally unwell. This is not uncommon, as chemotherapy can affect healthy cells as well as cancer cells. Report any side effects. Continue your course of treatment even though you feel ill unless your care team tells you to stop. In some cases, you may be given additional medications to help with side effects. Follow all directions for their use. This medication may increase your risk of getting an infection. Call your care team for advice if you get a fever, chills, sore throat,   or other symptoms of a cold or flu. Do not treat yourself. Try to avoid being around people who are sick. Avoid taking medications that contain aspirin, acetaminophen, ibuprofen, naproxen, or ketoprofen unless instructed by your care team. These medications may hide a fever. Be careful brushing or  flossing your teeth or using a toothpick because you may get an infection or bleed more easily. If you have any dental work done, tell your dentist you are receiving this medication. Talk to your care team if you wish to become pregnant or think you might be pregnant. This medication can cause serious birth defects. Talk to your care team about effective forms of contraception. Do not breast-feed while taking this medication. What side effects may I notice from receiving this medication? Side effects that you should report to your care team as soon as possible: Allergic reactions--skin rash, itching, hives, swelling of the face, lips, tongue, or throat Infection--fever, chills, cough, sore throat, wounds that don't heal, pain or trouble when passing urine, general feeling of discomfort or being unwell Low red blood cell level--unusual weakness or fatigue, dizziness, headache, trouble breathing Pain, tingling, or numbness in the hands or feet, muscle weakness, change in vision, confusion or trouble speaking, loss of balance or coordination, trouble walking, seizures Unusual bruising or bleeding Side effects that usually do not require medical attention (report to your care team if they continue or are bothersome): Hair loss Nausea Unusual weakness or fatigue Vomiting This list may not describe all possible side effects. Call your doctor for medical advice about side effects. You may report side effects to FDA at 1-800-FDA-1088. Where should I keep my medication? This medication is given in a hospital or clinic. It will not be stored at home. NOTE: This sheet is a summary. It may not cover all possible information. If you have questions about this medicine, talk to your doctor, pharmacist, or health care provider.  2024 Elsevier/Gold Standard (2021-04-23 00:00:00) Paclitaxel Injection What is this medication? PACLITAXEL (PAK li TAX el) treats some types of cancer. It works by slowing down the  growth of cancer cells. This medicine may be used for other purposes; ask your health care provider or pharmacist if you have questions. COMMON BRAND NAME(S): Onxol, Taxol What should I tell my care team before I take this medication? They need to know if you have any of these conditions: Heart disease Liver disease Low white blood cell levels An unusual or allergic reaction to paclitaxel, other medications, foods, dyes, or preservatives If you or your partner are pregnant or trying to get pregnant Breast-feeding How should I use this medication? This medication is injected into a vein. It is given by your care team in a hospital or clinic setting. Talk to your care team about the use of this medication in children. While it may be given to children for selected conditions, precautions do apply. Overdosage: If you think you have taken too much of this medicine contact a poison control center or emergency room at once. NOTE: This medicine is only for you. Do not share this medicine with others. What if I miss a dose? Keep appointments for follow-up doses. It is important not to miss your dose. Call your care team if you are unable to keep an appointment. What may interact with this medication? Do not take this medication with any of the following: Live virus vaccines Other medications may affect the way this medication works. Talk with your care team about all of the   medications you take. They may suggest changes to your treatment plan to lower the risk of side effects and to make sure your medications work as intended. This list may not describe all possible interactions. Give your health care provider a list of all the medicines, herbs, non-prescription drugs, or dietary supplements you use. Also tell them if you smoke, drink alcohol, or use illegal drugs. Some items may interact with your medicine. What should I watch for while using this medication? Your condition will be monitored  carefully while you are receiving this medication. You may need blood work while taking this medication. This medication may make you feel generally unwell. This is not uncommon as chemotherapy can affect healthy cells as well as cancer cells. Report any side effects. Continue your course of treatment even though you feel ill unless your care team tells you to stop. This medication can cause serious allergic reactions. To reduce the risk, your care team may give you other medications to take before receiving this one. Be sure to follow the directions from your care team. This medication may increase your risk of getting an infection. Call your care team for advice if you get a fever, chills, sore throat, or other symptoms of a cold or flu. Do not treat yourself. Try to avoid being around people who are sick. This medication may increase your risk to bruise or bleed. Call your care team if you notice any unusual bleeding. Be careful brushing or flossing your teeth or using a toothpick because you may get an infection or bleed more easily. If you have any dental work done, tell your dentist you are receiving this medication. Talk to your care team if you may be pregnant. Serious birth defects can occur if you take this medication during pregnancy. Talk to your care team before breastfeeding. Changes to your treatment plan may be needed. What side effects may I notice from receiving this medication? Side effects that you should report to your care team as soon as possible: Allergic reactions--skin rash, itching, hives, swelling of the face, lips, tongue, or throat Heart rhythm changes--fast or irregular heartbeat, dizziness, feeling faint or lightheaded, chest pain, trouble breathing Increase in blood pressure Infection--fever, chills, cough, sore throat, wounds that don't heal, pain or trouble when passing urine, general feeling of discomfort or being unwell Low blood pressure--dizziness, feeling faint  or lightheaded, blurry vision Low red blood cell level--unusual weakness or fatigue, dizziness, headache, trouble breathing Painful swelling, warmth, or redness of the skin, blisters or sores at the infusion site Pain, tingling, or numbness in the hands or feet Slow heartbeat--dizziness, feeling faint or lightheaded, confusion, trouble breathing, unusual weakness or fatigue Unusual bruising or bleeding Side effects that usually do not require medical attention (report to your care team if they continue or are bothersome): Diarrhea Hair loss Joint pain Loss of appetite Muscle pain Nausea Vomiting This list may not describe all possible side effects. Call your doctor for medical advice about side effects. You may report side effects to FDA at 1-800-FDA-1088. Where should I keep my medication? This medication is given in a hospital or clinic. It will not be stored at home. NOTE: This sheet is a summary. It may not cover all possible information. If you have questions about this medicine, talk to your doctor, pharmacist, or health care provider.  2024 Elsevier/Gold Standard (2021-05-21 00:00:00)       To help prevent nausea and vomiting after your treatment, we encourage you to take your   nausea medication as directed.  BELOW ARE SYMPTOMS THAT SHOULD BE REPORTED IMMEDIATELY: *FEVER GREATER THAN 100.4 F (38 C) OR HIGHER *CHILLS OR SWEATING *NAUSEA AND VOMITING THAT IS NOT CONTROLLED WITH YOUR NAUSEA MEDICATION *UNUSUAL SHORTNESS OF BREATH *UNUSUAL BRUISING OR BLEEDING *URINARY PROBLEMS (pain or burning when urinating, or frequent urination) *BOWEL PROBLEMS (unusual diarrhea, constipation, pain near the anus) TENDERNESS IN MOUTH AND THROAT WITH OR WITHOUT PRESENCE OF ULCERS (sore throat, sores in mouth, or a toothache) UNUSUAL RASH, SWELLING OR PAIN  UNUSUAL VAGINAL DISCHARGE OR ITCHING   Items with * indicate a potential emergency and should be followed up as soon as possible or go to  the Emergency Department if any problems should occur.  Please show the CHEMOTHERAPY ALERT CARD or IMMUNOTHERAPY ALERT CARD at check-in to the Emergency Department and triage nurse.  Should you have questions after your visit or need to cancel or reschedule your appointment, please contact [facility] [phone]  and follow the prompts.  Office hours are 8:00 a.m. to 4:30 p.m. Monday - Friday. Please note that voicemails left after 4:00 p.m. may not be returned until the following business day.  We are closed weekends and major holidays. You have access to a nurse at all times for urgent questions. Please call the main number to the clinic [phone] and follow the prompts.  For any non-urgent questions, you may also contact your provider using MyChart. We now offer e-Visits for anyone 18 and older to request care online for non-urgent symptoms. For details visit [email or web address].   Also download the MyChart app! Go to the app store, search "MyChart", open the app, select [place], and log in with your MyChart username and password.   

## 2022-06-24 NOTE — Telephone Encounter (Signed)
Transition Care Management Unsuccessful Follow-up Telephone Call  Date of discharge and from where:  06/18/2022 [facility]  Attempts:  2nd Attempt  Reason for unsuccessful TCM follow-up call:  Unable to leave message Patient in chemo asked that I call her back tomorrow. Transportation and food resources needed.  Kristofor Michalowski [facility]  [facility] Population Health Community Resource Care Guide   [email or web address]  ?? [phone]   Website: [email or web address]  [email or web address]

## 2022-06-24 NOTE — Progress Notes (Signed)
Patients port flushed without difficulty.  Good blood return noted with no bruising or swelling noted at site.  VSS. Patient remains accessed for treatment.  

## 2022-06-25 ENCOUNTER — Ambulatory Visit: Payer: Self-pay | Admitting: Licensed Clinical Social Worker

## 2022-06-25 ENCOUNTER — Telehealth: Payer: Self-pay

## 2022-06-25 NOTE — Patient Instructions (Signed)
Social Work Visit Information  Thank you for taking time to visit with me today. Please don't hesitate to contact me if I can be of assistance to you.   Following are the goals we discussed today:   Goals Addressed             This Visit's Progress    Care Coordination Activiites Reduce symptoms of anxiety related to health concerns       Activities and task to complete in order to accomplish goals.   You have decided not to move forward with counseling and would like to work with me on brief coping skills to assist you with managing symptoms of anxiety Start / continue relaxed breathing 3 times daily Keep all upcoming appointment discussed today Continue with compliance of taking medication prescribed by Doctor Make sure you have the name and phone number of your [facility] Navigator  Complete Advance Directive packet,  Have advance directive notarized and provide a copy to provider office            Our next appointment is by telephone on 07/29/22 at 10:30   Please call the care guide team at [phone] if you need to cancel or reschedule your appointment.   If you or anyone you know are experiencing a Mental Health or Behavioral Health Crisis or need someone to talk to, please call the Suicide and Crisis Lifeline: 988 call the [place] National Suicide Prevention Lifeline: 5126298572 or TTY: 769-525-1323 TTY 727-120-1617) to talk to a trained counselor call 1-800-273-TALK (toll free, 24 hour hotline) go to [facility] [address], [place] [phone])   Patient verbalizes understanding of instructions and care plan provided today and agrees to view in MyChart. Active MyChart status and patient understanding of how to access instructions and care plan via MyChart confirmed with patient.       [name], LCSW Social Work Care Coordination  [facility] [employer] [phone]

## 2022-06-25 NOTE — Patient Outreach (Signed)
  Care Coordination  Follow Up Visit Note   06/25/2022 Name: Laura [name] MRN: [id number] DOB: [birth year]  Laura [name] is a 77 y.o. year old female who sees Corwin Levins, MD for primary care. I spoke with  Laura [name] by phone today.  What matters to the patients health and wellness today?  Understanding her health needs and managing symptoms of anxiety  Patient is making great progress with managing symptoms of anxiety related to her health.  Had cancer treatment yesterday and will return tomorrow.   Goals Addressed             This Visit's Progress    Care Coordination Activiites Reduce symptoms of anxiety related to health concerns       Activities and task to complete in order to accomplish goals.   You have decided not to move forward with counseling and would like to work with me on brief coping skills to assist you with managing symptoms of anxiety Start / continue relaxed breathing 3 times daily Keep all upcoming appointment discussed today Continue with compliance of taking medication prescribed by Doctor Make sure you have the name and phone number of your [facility] Navigator  Complete Advance Directive packet,  Have advance directive notarized and provide a copy to provider office           SDOH assessments and interventions completed:  No   Care Coordination Interventions:  Yes, provided  Interventions Today    Flowsheet Row Most Recent Value  Chronic Disease   Chronic disease during today's visit Hypertension (HTN), Diabetes, Chronic Kidney Disease/End Stage Renal Disease (ESRD)  General Interventions   General Interventions Discussed/Reviewed General Interventions Reviewed  Education Interventions   Provided Verbal Education On Mental Health/Coping with Illness, Insurance Plans  [review support with [facility] Medicare Navigator  [phone]  currently connected with her Navigator]  Mental Health Interventions   Mental Health Discussed/Reviewed Mental Health  Reviewed, Anxiety, Coping Strategies  [active listening,  solution focus,  emotional support, ]  Advanced Directive Interventions   Advanced Directives Discussed/Reviewed Advanced Directives Discussed, Advanced Care Planning  [has completed document but needs to have noterized]       Follow up plan: Follow up call scheduled for 07/29/22    Encounter Outcome:  Pt. Visit Completed   [name], LCSW Social Work Care Coordination  [facility] [employer] [employer] [phone]

## 2022-06-25 NOTE — Telephone Encounter (Signed)
Transition Care Management Follow-up Telephone Call Date of discharge and from where: 06/18/2022 [facility] How have you been since you were released from the hospital? Patient is feeling much better Any questions or concerns? No  Items Reviewed: Did the pt receive and understand the discharge instructions provided? Yes  Medications obtained and verified? Yes  Other?  Gave patient contact number for [employer] transportation and mailed summary of benefits. Any new allergies since your discharge? No  Dietary orders reviewed? Yes Do you have support at home? Yes   Follow up appointments reviewed:  PCP Hospital f/u appt confirmed? No  Scheduled to see  on  @ . Specialist Hospital f/u appt confirmed? Yes  Scheduled to see Doreatha Massed, MD on 06/19/2022 @ [facility]. Are transportation arrangements needed?  Gave patient contact number for [facility] [employer] transportation. If their condition worsens, is the pt aware to call PCP or go to the Emergency Dept.? Yes Was the patient provided with contact information for the PCP's office or ED? Yes Was to pt encouraged to call back with questions or concerns? Yes  Sandi Towe [facility]  [facility] Population Health Community Resource Care Guide   [email or web address]  ?? [phone]   Website: [email or web address]  [email or web address]

## 2022-06-26 ENCOUNTER — Other Ambulatory Visit: Payer: Self-pay

## 2022-06-26 ENCOUNTER — Inpatient Hospital Stay: Payer: 59

## 2022-06-26 VITALS — BP 115/67 | HR 92 | Temp 97.6°F | Resp 18 | Ht 63.0 in | Wt 196.7 lb

## 2022-06-26 DIAGNOSIS — D696 Thrombocytopenia, unspecified: Secondary | ICD-10-CM | POA: Diagnosis not present

## 2022-06-26 DIAGNOSIS — C563 Malignant neoplasm of bilateral ovaries: Secondary | ICD-10-CM | POA: Diagnosis not present

## 2022-06-26 DIAGNOSIS — D630 Anemia in neoplastic disease: Secondary | ICD-10-CM | POA: Diagnosis not present

## 2022-06-26 DIAGNOSIS — N189 Chronic kidney disease, unspecified: Secondary | ICD-10-CM | POA: Diagnosis not present

## 2022-06-26 DIAGNOSIS — F32A Depression, unspecified: Secondary | ICD-10-CM | POA: Diagnosis not present

## 2022-06-26 DIAGNOSIS — Z95828 Presence of other vascular implants and grafts: Secondary | ICD-10-CM

## 2022-06-26 DIAGNOSIS — G893 Neoplasm related pain (acute) (chronic): Secondary | ICD-10-CM | POA: Diagnosis not present

## 2022-06-26 DIAGNOSIS — D72829 Elevated white blood cell count, unspecified: Secondary | ICD-10-CM | POA: Diagnosis not present

## 2022-06-26 DIAGNOSIS — Z5189 Encounter for other specified aftercare: Secondary | ICD-10-CM | POA: Diagnosis not present

## 2022-06-26 DIAGNOSIS — D631 Anemia in chronic kidney disease: Secondary | ICD-10-CM | POA: Diagnosis not present

## 2022-06-26 DIAGNOSIS — K5903 Drug induced constipation: Secondary | ICD-10-CM | POA: Diagnosis not present

## 2022-06-26 DIAGNOSIS — Z5111 Encounter for antineoplastic chemotherapy: Secondary | ICD-10-CM | POA: Diagnosis not present

## 2022-06-26 DIAGNOSIS — D509 Iron deficiency anemia, unspecified: Secondary | ICD-10-CM | POA: Diagnosis not present

## 2022-06-26 DIAGNOSIS — I129 Hypertensive chronic kidney disease with stage 1 through stage 4 chronic kidney disease, or unspecified chronic kidney disease: Secondary | ICD-10-CM | POA: Diagnosis not present

## 2022-06-26 DIAGNOSIS — I890 Lymphedema, not elsewhere classified: Secondary | ICD-10-CM | POA: Diagnosis not present

## 2022-06-26 DIAGNOSIS — C772 Secondary and unspecified malignant neoplasm of intra-abdominal lymph nodes: Secondary | ICD-10-CM | POA: Diagnosis not present

## 2022-06-26 LAB — CA 125: Cancer Antigen (CA) 125: 7.8 U/mL (ref 0.0–38.1)

## 2022-06-26 MED ORDER — PEGFILGRASTIM-CBQV 6 MG/0.6ML ~~LOC~~ SOSY
6.0000 mg | PREFILLED_SYRINGE | Freq: Once | SUBCUTANEOUS | Status: AC
  Administered 2022-06-26: 6 mg via SUBCUTANEOUS
  Filled 2022-06-26: qty 0.6

## 2022-06-26 NOTE — Progress Notes (Signed)
Patient presents today for udenyca injection, patient reports feeling tired, she states she believes there is swelling in her right leg. Patient instructed to call if swelling gets worse, patient scheduled for symptom management. Patient tolerated injection with no complaints voiced. Site clean and dry with no bruising or swelling noted at site. See MAR for details. Band aid applied.  Patient stable during and after injection. VSS with discharge and left in satisfactory condition with no s/s of distress noted.

## 2022-06-26 NOTE — Patient Instructions (Signed)
[facility]  Discharge Instructions: Thank you for choosing [facility] to provide your oncology and hematology care.  If you have a lab appointment with the Cancer Center - please note that after April 8th, 2024, all labs will be drawn in the cancer center.  You do not have to check in or register with the main entrance as you have in the past but will complete your check-in in the cancer center.  Wear comfortable clothing and clothing appropriate for easy access to any Portacath or PICC line.   We strive to give you quality time with your provider. You may need to reschedule your appointment if you arrive late (15 or more minutes).  Arriving late affects you and other patients whose appointments are after yours.  Also, if you miss three or more appointments without notifying the office, you may be dismissed from the clinic at the provider's discretion.      For prescription refill requests, have your pharmacy contact our office and allow 72 hours for refills to be completed.    Today you received the following Udenyca injection, return as scheduled.    To help prevent nausea and vomiting after your treatment, we encourage you to take your nausea medication as directed.  BELOW ARE SYMPTOMS THAT SHOULD BE REPORTED IMMEDIATELY: *FEVER GREATER THAN 100.4 F (38 C) OR HIGHER *CHILLS OR SWEATING *NAUSEA AND VOMITING THAT IS NOT CONTROLLED WITH YOUR NAUSEA MEDICATION *UNUSUAL SHORTNESS OF BREATH *UNUSUAL BRUISING OR BLEEDING *URINARY PROBLEMS (pain or burning when urinating, or frequent urination) *BOWEL PROBLEMS (unusual diarrhea, constipation, pain near the anus) TENDERNESS IN MOUTH AND THROAT WITH OR WITHOUT PRESENCE OF ULCERS (sore throat, sores in mouth, or a toothache) UNUSUAL RASH, SWELLING OR PAIN  UNUSUAL VAGINAL DISCHARGE OR ITCHING   Items with * indicate a potential emergency and should be followed up as soon as possible or go to the Emergency  Department if any problems should occur.  Please show the CHEMOTHERAPY ALERT CARD or IMMUNOTHERAPY ALERT CARD at check-in to the Emergency Department and triage nurse.  Should you have questions after your visit or need to cancel or reschedule your appointment, please contact [facility] [phone]  and follow the prompts.  Office hours are 8:00 a.m. to 4:30 p.m. Monday - Friday. Please note that voicemails left after 4:00 p.m. may not be returned until the following business day.  We are closed weekends and major holidays. You have access to a nurse at all times for urgent questions. Please call the main number to the clinic [phone] and follow the prompts.  For any non-urgent questions, you may also contact your provider using MyChart. We now offer e-Visits for anyone 84 and older to request care online for non-urgent symptoms. For details visit [email or web address].   Also download the MyChart app! Go to the app store, search "MyChart", open the app, select [place], and log in with your MyChart username and password.

## 2022-06-30 ENCOUNTER — Emergency Department (HOSPITAL_BASED_OUTPATIENT_CLINIC_OR_DEPARTMENT_OTHER)
Admission: EM | Admit: 2022-06-30 | Discharge: 2022-06-30 | Disposition: A | Discharge status: Home / Self Care | Payer: 59 | Attending: Emergency Medicine | Admitting: Emergency Medicine

## 2022-06-30 ENCOUNTER — Emergency Department (HOSPITAL_BASED_OUTPATIENT_CLINIC_OR_DEPARTMENT_OTHER): Payer: 59

## 2022-06-30 ENCOUNTER — Other Ambulatory Visit: Payer: Self-pay

## 2022-06-30 DIAGNOSIS — D72829 Elevated white blood cell count, unspecified: Secondary | ICD-10-CM | POA: Diagnosis not present

## 2022-06-30 DIAGNOSIS — K5900 Constipation, unspecified: Secondary | ICD-10-CM | POA: Diagnosis not present

## 2022-06-30 DIAGNOSIS — K6289 Other specified diseases of anus and rectum: Secondary | ICD-10-CM | POA: Insufficient documentation

## 2022-06-30 DIAGNOSIS — C569 Malignant neoplasm of unspecified ovary: Secondary | ICD-10-CM | POA: Diagnosis not present

## 2022-06-30 DIAGNOSIS — C563 Malignant neoplasm of bilateral ovaries: Secondary | ICD-10-CM | POA: Diagnosis not present

## 2022-06-30 DIAGNOSIS — N1330 Unspecified hydronephrosis: Secondary | ICD-10-CM | POA: Diagnosis not present

## 2022-06-30 DIAGNOSIS — C772 Secondary and unspecified malignant neoplasm of intra-abdominal lymph nodes: Secondary | ICD-10-CM | POA: Diagnosis not present

## 2022-06-30 LAB — COMPREHENSIVE METABOLIC PANEL
ALT: 13 U/L (ref 0–44)
AST: 19 U/L (ref 15–41)
Albumin: 4.1 g/dL (ref 3.5–5.0)
Alkaline Phosphatase: 74 U/L (ref 38–126)
Anion gap: 10 (ref 5–15)
BUN: 24 mg/dL — ABNORMAL HIGH (ref 8–23)
CO2: 22 mmol/L (ref 22–32)
Calcium: 9.5 mg/dL (ref 8.9–10.3)
Chloride: 102 mmol/L (ref 98–111)
Creatinine, Ser: 1.38 mg/dL — ABNORMAL HIGH (ref 0.44–1.00)
GFR, Estimated: 39 mL/min — ABNORMAL LOW (ref >60)
Glucose, Bld: 101 mg/dL — ABNORMAL HIGH (ref 70–99)
Potassium: 4.1 mmol/L (ref 3.5–5.1)
Sodium: 134 mmol/L — ABNORMAL LOW (ref 135–145)
Total Bilirubin: 0.3 mg/dL (ref 0.3–1.2)
Total Protein: 7 g/dL (ref 6.5–8.1)

## 2022-06-30 LAB — CBC WITH DIFFERENTIAL/PLATELET
MCHC: 33.3 g/dL (ref 30.0–36.0)
Platelets: 132 10*3/uL — ABNORMAL LOW (ref 150–400)
RBC: 3.71 MIL/uL — ABNORMAL LOW (ref 3.87–5.11)

## 2022-06-30 LAB — LIPASE, BLOOD: Lipase: <10 U/L — ABNORMAL LOW (ref 11–51)

## 2022-06-30 MED ORDER — ONDANSETRON HCL 4 MG/2ML IJ SOLN
4.0000 mg | Freq: Once | INTRAMUSCULAR | Status: AC
  Administered 2022-06-30: 4 mg via INTRAVENOUS
  Filled 2022-06-30: qty 2

## 2022-06-30 MED ORDER — SODIUM CHLORIDE 0.9 % IV SOLN
INTRAVENOUS | Status: DC
  Administered 2022-06-30: 1000 mL via INTRAVENOUS

## 2022-06-30 MED ORDER — SODIUM CHLORIDE 0.9 % IV BOLUS
500.0000 mL | Freq: Once | INTRAVENOUS | Status: AC
  Administered 2022-06-30: 500 mL via INTRAVENOUS

## 2022-06-30 MED ORDER — IOHEXOL 300 MG/ML  SOLN
100.0000 mL | Freq: Once | INTRAMUSCULAR | Status: AC | PRN
  Administered 2022-06-30: 75 mL via INTRAVENOUS

## 2022-06-30 NOTE — Progress Notes (Unsigned)
[facility] [address], [place] [address] Phone: [phone] Fax: [phone]  SYMPTOM MANAGEMENT CLINIC PROGRESS NOTE   Laura [name] [id number] 08-Mar-1945 77 y.o.  Laura [name] is managed by Dr. Ellin Saba for recurrent ovarian serous carcinoma  Actively treated with chemotherapy/immunotherapy/hormonal therapy: YES  Current therapy: Carboplatin + paclitaxel  Last treated: Cycle #1, day #1 06/24/2022  INTERVAL HISTORY:  Chief Complaint: Chemotherapy follow-up & symptom management visit  [name] previously received 6 cycles of dose reduced carboplatin and paclitaxel from 05/22/2021 through 09/04/2021.  She was restarted on carboplatin and paclitaxel on 06/24/2022 due to disease recurrence and progression.  If she tolerates this well, she will receive bevacizumab with cycle #2.  Patient reports significantly increased fatigue and weakness following chemotherapy.  She is still able to take care of her daily ADLs, but is requiring frequent rest breaks.  She has not had any falls at home.  She reports excellent hydration, drinking 64 ounces of water as well as other liquids throughout the day.  She reports decreased appetite, but she is able to eat small portions throughout the day.  Her weight today is 193 pounds (down 3 pounds since she received treatment last week).  She has had mild nausea (without vomiting) relieved with Compazine.    RIGHT LOWER QUADRANT PAIN: She has ongoing right lower quadrant abdominal pelvic pain radiating to her right flank and back.  Pain today is 4/10.  Pain is at times severe and makes it difficult for her to sleep.  She took Dilaudid last week as prescribed, but never started her MS Contin.  She is hesitant to try additional opioid pain medications due to constipation.  She is currently wearing lidocaine patch with some relief.  OPIOID INDUCED CONSTIPATION: She reports worsening constipation after starting chemotherapy  and also related to opioid analgesia.  She reports that last week when she was taking her Dilaudid, she went 3 to 4 days without bowel movement, despite taking Colace and MiraLAX as prescribed.  She finally had to digitally disimpact herself in order to have a bowel movement yesterday (06/30/2022), but continued to have rectal urgency and rectal pain with the feeling that she "needed to have a bowel movement but was not able to."  She has not had any rectal bleeding.  She went to emergency department, where she was given IV fluids and IV Zofran.  She had bowel movement while in the emergency department.  CT scan was negative for obstruction but did show constipation.  PERIPHERAL EDEMA: She has progressive bilateral lower extremity edema, left greater than right, which has been ongoing for the past 6+ months.  She had ultrasound in March 2024 that was negative for DVT.  She denies any chest pain or shortness of breath at rest, but does have some dyspnea on exertion.  DEPRESSION: Patient is at times tearful during her visit today and reports that she has been "emotional" lately.  She is at times struggling to cope with the emotional ramifications of her pain and weakness.  She remains grateful for support from her family, friends, and church community.  She particularly enjoys spending time with her 17 grandchildren and 31 great-grandchildren.  She does not want to start any antidepressants at this time.  PERTINENT NEGATIVES: She denies any diarrhea, mouth sores, rashes, skin changes, fevers, chills, night sweats, abnormal bruising/bleeding, neurologic changes, or changes in urine output.    She reports 50% energy and 50% appetite.  ASSESSMENT &  PLAN:  ## Recurrent ovarian serous carcinoma - Primary medical oncologist is Dr. Ellin Saba - She is receiving palliative (noncurative) treatment - Previously received 6 cycles of dose reduced carboplatin and paclitaxel from 05/22/2021 through 09/04/2021.  She was  restarted on carboplatin and paclitaxel on 06/24/2022 due to disease recurrence and progression.  If she tolerates this well, she will receive bevacizumab with cycle #2. - Received Udenyca on 06/26/2022 - CT abdomen/pelvis (performed in Emergency Department on 06/30/2022): Constipation.  Increased prominence of retrocrural lymph nodes and anterior right psoas mass.  No significant change in conglomerate retroperitoneal adenopathy.  Unchanged mild bilateral hydronephrosis with right ureteral stent and stable positioning.  Ill-defined hypodense liver lesion not seen on scan from 06/14/2022 which could represent focal fat, aberrant venous drainage, or metastatic lesion per radiologist. - Labs today (07/01/2022): Show expected leukocytosis with WBC 11.6/ANC 9.9, mild thrombocytopenia 117, and stable anemia with Hgb 10.2.  CMP overall at baseline.  Mild hypomagnesemia 1.5. - PLAN: Next MD visit with Dr. Ellin Saba for cycle #2 of treatment on 07/21/2022.  # Cancer-related pain:  - Worsening right lower quadrant abdominal pelvic pain radiating to her right flank and back. - She has significant retroperitoneal lymph node metastases, with encasement of proximal right ureter (s/p right ureteral stent) and with mass of right anterior psoas - She took Dilaudid last week as prescribed, but never started her MS Contin. - Hesitant to try additional opioid pain medications due to constipation. - Currently wearing lidocaine patch with some relief. - PLAN: Patient is very hesitant to use opioid analgesia due to concerns regarding constipation and fear that "she will become an addict."  Discussed with her that she has legitimate cancer related pain and that opioid analgesia will likely improve her quality of life overall. - Recommended taking Dilaudid 4 mg as needed severe pain. - If she is requiring >3 doses of Dilaudid within a day, I have recommended that she start MS Contin 15 mg every 12 hours for better baseline pain  control.  # Opioid-induced constipation - Significant constipation following chemotherapy and opioid analgesia - She has been taking Colace once daily with as needed MiraLAX, with little relief - PLAN: Instructed to take 2 Colace 100 mg capsules twice daily (200 mg in the morning +200 mg in the evening) - Take lactulose 30 mL (10 g / 30 mL) every night at bedtime. - If no bowel movement in >3 days, take 30 mL every 3 hours until bowel movement occurs.  # Cancer-related lymphedema - Progressive bilateral lower extremity edema, left greater than right, which has been ongoing for the past 6+ months. - Ultrasound in March 2024 that was negative for DVT. - Denies any chest pain or shortness of breath at rest, but does have some dyspnea on exertion - CT abdomen/pelvis from ED visit on 06/30/2022 showed subcutaneous edema of left thigh, which was also present on 06/18/2022 (but not on 06/14/2022) - PLAN: Suspect bilateral lymphedema related to metastatic ovarian cancer with impaired lymphatic drainage related to metastatic lymph node disease. - Will start patient on Lasix 20 mg daily as needed.  Encouraged her to continue hydration and leg elevation.  # Hypomagnesemia - Labs today (07/01/2022) show low magnesium 1.5 - PLAN: We will give IV magnesium 4 g in clinic today.  Prescription sent to pharmacy to start taking magnesium oxide 400 mg daily.    # Cancer-related depression - Struggling to cope with the emotional ramifications of her cancer diagnosis, pain, and weakness. -  She has good support system, although she lives at home alone. - She does not want to start any antidepressants at this time.  # Normocytic anemia: - Normocytic anemia from malignancy and CKD / functional iron deficiency. - Hgb today stable at 10.2/MCV 89.3.   # Hypertension: - Blood pressure today is 152/80.   - PLAN: Continue amlodipine daily.  PLAN SUMMARY: >> Next scheduled appointment with medical oncologist: 07/21/2022  with Dr. Ellin Saba    REVIEW OF SYSTEMS:   Review of Systems  Constitutional:  Positive for activity change, appetite change and fatigue. Negative for chills, diaphoresis, fever and unexpected weight change.  HENT:  Negative for mouth sores, nosebleeds, sore throat and trouble swallowing.   Respiratory:  Positive for shortness of breath (with exertion). Negative for cough.   Cardiovascular:  Negative for chest pain, palpitations and leg swelling.  Gastrointestinal:  Positive for constipation and nausea. Negative for abdominal pain, blood in stool, diarrhea and vomiting.  Genitourinary:  Negative for dysuria and hematuria.  Neurological:  Positive for dizziness and numbness. Negative for light-headedness and headaches.  Psychiatric/Behavioral:  Negative for dysphoric mood and sleep disturbance. The patient is not nervous/anxious.     Past Medical History, Surgical history, Social history, and Family history were reviewed as documented elsewhere in chart, and were updated as appropriate.   OBJECTIVE:  Physical Exam:  There were no vitals taken for this visit. ECOG: 2  Physical Exam Constitutional:      Appearance: Normal appearance. She is obese.  Cardiovascular:     Heart sounds: Normal heart sounds.  Pulmonary:     Breath sounds: Normal breath sounds.  Musculoskeletal:     Right lower leg: Edema (2+) present.     Left lower leg: Edema (3+ edema, extending to thigh) present.  Neurological:     General: No focal deficit present.     Mental Status: Mental status is at baseline.  Psychiatric:        Mood and Affect: Affect is tearful.        Behavior: Behavior normal. Behavior is cooperative.     Lab Review:     Component Value Date/Time   NA 133 (L) 06/24/2022 0909   K 3.9 06/24/2022 0909   CL 103 06/24/2022 0909   CO2 22 06/24/2022 0909   GLUCOSE 129 (H) 06/24/2022 0909   BUN 22 06/24/2022 0909   CREATININE 1.25 (H) 06/24/2022 0909   CREATININE 0.98 05/30/2020 1016    CALCIUM 9.4 06/24/2022 0909   PROT 6.9 06/24/2022 0909   ALBUMIN 3.5 06/24/2022 0909   AST 18 06/24/2022 0909   ALT 11 06/24/2022 0909   ALKPHOS 59 06/24/2022 0909   BILITOT 0.2 (L) 06/24/2022 0909   GFRNONAA 44 (L) 06/24/2022 0909   GFRNONAA >60 05/30/2020 1016   GFRAA 57 (L) 10/06/2019 0754       Component Value Date/Time   WBC 6.6 06/24/2022 0909   RBC 3.86 (L) 06/24/2022 0909   HGB 11.1 (L) 06/24/2022 0909   HCT 34.4 (L) 06/24/2022 0909   PLT 214 06/24/2022 0909   MCV 89.1 06/24/2022 0909   MCH 28.8 06/24/2022 0909   MCHC 32.3 06/24/2022 0909   RDW 15.1 06/24/2022 0909   LYMPHSABS 1.2 06/24/2022 0909   MONOABS 0.6 06/24/2022 0909   EOSABS 0.1 06/24/2022 0909   BASOSABS 0.0 06/24/2022 0909   -------------------------------  Imaging from last 24 hours (if applicable): Radiology interpretation: CT Renal Stone Study  Result Date: 06/18/2022 CLINICAL DATA:  Right  flank pain.  History of ovarian cancer. EXAM: CT ABDOMEN AND PELVIS WITHOUT CONTRAST TECHNIQUE: Multidetector CT imaging of the abdomen and pelvis was performed following the standard protocol without IV contrast. RADIATION DOSE REDUCTION: This exam was performed according to the departmental dose-optimization program which includes automated exposure control, adjustment of the mA and/or kV according to patient size and/or use of iterative reconstruction technique. COMPARISON:  CT scan of June 14, 2022. FINDINGS: Lower chest: Moderate size sliding-type hiatal hernia. Minimal bibasilar subsegmental atelectasis. Hepatobiliary: No focal liver abnormality is seen. Status post cholecystectomy. No biliary dilatation. Pancreas: Unremarkable. No pancreatic ductal dilatation or surrounding inflammatory changes. Spleen: Normal in size without focal abnormality. Adrenals/Urinary Tract: Adrenal glands appear normal. Right-sided ureteral stent is noted in grossly good position. Mild right hydronephrosis is noted. Left kidney and ureter  are unremarkable. Urinary bladder is unremarkable. Stomach/Bowel: Stomach is within normal limits. Appendix appears normal. No evidence of bowel wall thickening, distention, or inflammatory changes. Vascular/Lymphatic: Aortic atherosclerosis. As noted on prior CT scan, retroperitoneal adenopathy measuring 5.6 x 3.9 cm is noted highly concerning for necrotic adenopathy and metastatic disease. 13 x 10 mm right retrocrural lymph node is noted which was also noted on prior exam. Reproductive: Status post hysterectomy. No adnexal masses. Other: No abdominal wall hernia or abnormality. No abdominopelvic ascites. Musculoskeletal: No acute or significant osseous findings. IMPRESSION: Right-sided ureteral stent is noted in grossly good position. Mild right hydronephrosis is noted. Retrocrural and retroperitoneal adenopathy concerning for metastatic disease is again noted as described on recent prior CT scan. Moderate size sliding-type hiatal hernia. Aortic Atherosclerosis (ICD10-I70.0). Electronically Signed   By: Lupita Raider M.D.   On: 06/18/2022 09:39   VAS [place] LOWER EXTREMITY VENOUS (DVT)  Result Date: 06/16/2022  Lower Venous DVT Study Patient Name:  Laura [name]  Date of Exam:   06/15/2022 Medical Rec #: [id number]      Accession #:    [id number] Date of Birth: 04-07-1945      Patient Gender: F Patient Age:   11 years Exam Location:  [facility] Procedure:      VAS [place] LOWER EXTREMITY VENOUS (DVT) Referring Phys: DAVID ORTIZ --------------------------------------------------------------------------------  Indications: Swelling.  Risk Factors: Cancer. Limitations: Poor ultrasound/tissue interface. Comparison Study: No prior studies. Performing Technologist: Chanda Busing RVT  Examination Guidelines: A complete evaluation includes B-mode imaging, spectral Doppler, color Doppler, and power Doppler as needed of all accessible portions of each vessel. Bilateral testing is considered an integral part of a  complete examination. Limited examinations for reoccurring indications may be performed as noted. The reflux portion of the exam is performed with the patient in reverse Trendelenburg.  +---------+---------------+---------+-----------+----------+--------------+ RIGHT    CompressibilityPhasicitySpontaneityPropertiesThrombus Aging +---------+---------------+---------+-----------+----------+--------------+ CFV      Full           Yes      Yes                                 +---------+---------------+---------+-----------+----------+--------------+ SFJ      Full                                                        +---------+---------------+---------+-----------+----------+--------------+ FV Prox  Full                                                        +---------+---------------+---------+-----------+----------+--------------+  FV Mid   Full                                                        +---------+---------------+---------+-----------+----------+--------------+ FV DistalFull                                                        +---------+---------------+---------+-----------+----------+--------------+ PFV      Full                                                        +---------+---------------+---------+-----------+----------+--------------+ POP      Full           Yes      Yes                                 +---------+---------------+---------+-----------+----------+--------------+ PTV      Full                                                        +---------+---------------+---------+-----------+----------+--------------+ PERO     Full                                                        +---------+---------------+---------+-----------+----------+--------------+   +---------+---------------+---------+-----------+----------+--------------+ LEFT     CompressibilityPhasicitySpontaneityPropertiesThrombus Aging  +---------+---------------+---------+-----------+----------+--------------+ CFV      Full           Yes      Yes                                 +---------+---------------+---------+-----------+----------+--------------+ SFJ      Full                                                        +---------+---------------+---------+-----------+----------+--------------+ FV Prox  Full                                                        +---------+---------------+---------+-----------+----------+--------------+ FV Mid   Full           Yes      Yes                                 +---------+---------------+---------+-----------+----------+--------------+  FV Distal               Yes      Yes                                 +---------+---------------+---------+-----------+----------+--------------+ PFV      Full                                                        +---------+---------------+---------+-----------+----------+--------------+ POP      Full           Yes      Yes                                 +---------+---------------+---------+-----------+----------+--------------+ PTV      Full                                                        +---------+---------------+---------+-----------+----------+--------------+ PERO     Full                                                        +---------+---------------+---------+-----------+----------+--------------+     Summary: RIGHT: - There is no evidence of deep vein thrombosis in the lower extremity. However, portions of this examination were limited- see technologist comments above.  - No cystic structure found in the popliteal fossa.  LEFT: - There is no evidence of deep vein thrombosis in the lower extremity. However, portions of this examination were limited- see technologist comments above.  - No cystic structure found in the popliteal fossa.  *See table(s) above for measurements and observations.  Electronically signed by Lemar Livings MD on 06/16/2022 at 4:38:36 PM.    Final    CT ABDOMEN PELVIS W CONTRAST  Result Date: 06/14/2022 CLINICAL DATA:  Right flank pain EXAM: CT ABDOMEN AND PELVIS WITH CONTRAST TECHNIQUE: Multidetector CT imaging of the abdomen and pelvis was performed using the standard protocol following bolus administration of intravenous contrast. RADIATION DOSE REDUCTION: This exam was performed according to the departmental dose-optimization program which includes automated exposure control, adjustment of the mA and/or kV according to patient size and/or use of iterative reconstruction technique. CONTRAST:  80mL OMNIPAQUE IOHEXOL 300 MG/ML  SOLN COMPARISON:  04/10/2022 FINDINGS: Lower chest: Visualized lower lung fields are clear. Scattered coronary artery calcifications are seen. Hepatobiliary: There are ill-defined foci of decreased density in liver close to bony bodies and falciform ligament, possibly due to aberrant venous drainage or focal fatty infiltration. There is no dilation of bile ducts. Surgical clips are seen in gallbladder fossa. Pancreas: No focal abnormalities are seen. Spleen: Unremarkable. Adrenals/Urinary Tract: Adrenals are unremarkable. There is moderate right hydronephrosis. Right ureteral stent is seen. There is mild left hydronephrosis. There is no wall thickening and urinary bladder lower portion of the urinary bladder is extending below the level of pubic symphysis suggesting  possible cystocele. Stomach/Bowel: Moderate sized fixed hiatal hernia is seen. Small bowel loops are not dilated. Appendix is not distinctly seen. There is no pericecal inflammation. There is no significant wall thickening in colon. Few diverticula are seen in colon without signs of focal acute diverticulitis. Vascular/Lymphatic: Scattered arterial calcifications are seen in aorta and its major branches. There are few enlarged retrocrural lymph nodes measuring up to 9 mm in short axis. There is  para-aortic lymph node on the left side slightly below the left renal artery measuring 12 mm in short axis. There is 6.4 by 3.7 cm low-density mass in retroperitoneum at the level of aortic bifurcation suggesting necrotic lymphadenopathy. There is 2.4 x 2.2 cm soft tissue density in the anterior aspect of the right psoas muscle at the level of midportion of right kidney, possibly new lymphadenopathy. Reproductive: Uterus is not seen. No dominant adnexal masses are seen. Other: There is no ascites or pneumoperitoneum. Musculoskeletal: There is first-degree anterolisthesis at the L4-L5 level. Degenerative changes are noted in lumbar spine, more so in the facet joints in lower lumbar spine. There is spinal stenosis and encroachment of neural foramina at L4-L5 level. IMPRESSION: There is no evidence of intestinal obstruction or pneumoperitoneum. There is moderate right hydronephrosis. Right ureteral stent is noted. There is mild left hydronephrosis. There is 6.4 x 3.7 cm low-density lesion in the retroperitoneal location suggesting necrotic lymphadenopathy. There is interval increase in number and size of retroperitoneal lymph nodes suggesting progression of metastatic disease. There are enlarged retrocrural lymph nodes suggesting metastatic lymphadenopathy. Moderate sized fixed hiatal hernia. There are faint areas of decreased density in liver may suggest normal variation due to aberrant venous drainage or focal fatty infiltration. Lumbar spondylosis. Electronically Signed   By: Ernie Avena M.D.   On: 06/14/2022 12:39      WRAP UP:  All questions were answered. The patient knows to call the clinic with any problems, questions or concerns.  Medical decision making: High  Time spent on visit: I spent 40 minutes counseling the patient face to face. The total time spent in the appointment was 60 minutes and more than 50% was on counseling.  Carnella Guadalajara, PA-C  07/01/22 12:55 PM

## 2022-06-30 NOTE — ED Triage Notes (Addendum)
Ambulatory to triage. A+Ox4.  Attempting to have BM 3-4 hrs. BM earlier today. Currently receiving chemo-last treatment Tuesday- ovarian CA. Has been taking several stool softeners at home. Denies N/V, denies CP/SOB.

## 2022-06-30 NOTE — Discharge Instructions (Signed)
Keep your appointment with the cancer center at [facility] for tomorrow.  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  No evidence of any bowel obstruction.  There was evidence of some constipation.  But on rectal exam no stool in the vault that is probably because you had a reasonable bowel movement here.  I recommend that she do MiraLAX 4 times a day for a few days and then drop it down to twice a day.  Very safe and will help prevent the constipation.  Return for any new or worse symptoms.

## 2022-06-30 NOTE — ED Provider Notes (Addendum)
[place] EMERGENCY DEPARTMENT AT [facility] Provider Note   CSN: [id number] Arrival date & time: 06/30/22  1947     History  Chief Complaint  Patient presents with   Constipation    Laura [name] is a 77 y.o. female.  Patient here with concerns for constipation.  Patient is followed at [facility] for metastatic ovarian cancer.  Patient in June had a recurrence there are enlarged retrocrural lymph nodes suggesting metastatic adenopathy interval increase in the number and size of the retroperitoneal lymph nodes.  They started her back on chemo for this.  They restarted her on carboplatin and paclitaxel.  They were considering adding a immunotherapy as well.  Patient was on a lot of pain medicine she has not had to take it for the past 2 days.  She has Colace and she has MiraLAX that she is taking.  Patient had a bowel movement earlier today is but has had the urge to go since then and not been able to.  She started her chemo treatments as mentioned last Tuesday.  She denies any vomiting but does feel little nauseated currently denies any chest pain or shortness of breath.  Does state that there is some bilateral leg swelling.  Temp here 98.7 pulse 88 respirations 20 blood pressure 159/67 oxygen is 100% on room air.  Patient has lots of allergies to antibiotics.  Patient not on a blood thinner.  Past medical history significant for type 2 diabetes hyperlipidemia hypertension sleep apnea gastroesophageal reflux disease.  Past surgical history significant for cholecystectomy many years ago robotic assisted bilateral salpingo-oophorectomy in 2021.  Robotic and pelvic periaortic lymph node dissection in 2021.  Vaginal hysterectomy age late 15s.  This is had right ureteral stent placement in March 2023 and then had it done again in September 2023.  Had bilateral ureteral stents placed in March 2024 and then had another stent placed in April 2024.  Patient never used tobacco  products.  Patient denies any abdominal pain but does have a lot of pain in the rectal area.  Denies any bleeding.       Home Medications Prior to Admission medications   Medication Sig Start Date End Date Taking? Authorizing Provider  acetaminophen (TYLENOL) 500 MG tablet Take 1 tablet (500 mg total) by mouth every 6 (six) hours as needed. 05/16/20   Kerrin Champagne, MD  amLODipine (NORVASC) 5 MG tablet TAKE 1 TABLET (5 MG TOTAL) BY MOUTH DAILY. 11/07/21   Corwin Levins, MD  Bismuth Subsalicylate (PEPTO-BISMOL MAX STRENGTH PO) Take 10 mLs by mouth every 8 (eight) hours as needed (IBS symptoms). 06/13/21   [provider]  Blood Glucose Monitoring Suppl (ONE TOUCH ULTRA 2) w/Device KIT Use as directed 04/06/15   Corwin Levins, MD  Cholecalciferol (VITAMIN D3) 50 MCG (2000 UT) TABS Take 2,000 Units by mouth daily.    [provider]  docusate sodium (COLACE) 100 MG capsule Take 1 capsule (100 mg total) by mouth every 12 (twelve) hours. 06/18/22   Bethann Berkshire, MD  eszopiclone (LUNESTA) 2 MG TABS tablet Take 1 tablet (2 mg total) by mouth at bedtime as needed for sleep. Take immediately before bedtime 06/04/22   Corwin Levins, MD  Famotidine (PEPCID AC PO) Take 1 tablet by mouth daily as needed (indigestion).    [provider]  glimepiride (AMARYL) 2 MG tablet Take 0.5 tablets (1 mg total) by mouth daily before breakfast. 1/2 of 2 mg tab  07/08/21   Corwin Levins, MD  glucose blood [facility] ULTRA) test strip USE TO CHECK BLOOD SUGARS TWO TIMES DAILY 03/22/21   Corwin Levins, MD  HYDROmorphone (DILAUDID) 4 MG tablet Take 1 tablet (4 mg total) by mouth every 4 (four) hours as needed for severe pain. 06/19/22   Doreatha Massed, MD  hyoscyamine (ANASPAZ) 0.125 MG TBDP disintergrating tablet Place 1 tablet (0.125 mg total) under the tongue every 4 (four) hours as needed for up to 5 days for bladder spasms. 06/17/22 06/23/22  Zigmund Daniel., MD  Lancets [facility] ULTRASOFT)  lancets 1 each by Other route as needed for other. Use as instructed 03/20/21   Corwin Levins, MD  lidocaine (LIDODERM) 5 % Place 1 patch onto the skin daily. Remove & Discard patch within 12 hours or as directed by MD 06/19/22   Doreatha Massed, MD  loratadine (CLARITIN) 10 MG tablet Take 10 mg by mouth daily.    [provider]  mirtazapine (REMERON) 15 MG tablet Take 1 tablet (15 mg total) by mouth at bedtime. 06/04/22   Corwin Levins, MD  morphine (MS CONTIN) 15 MG 12 hr tablet Take 1 tablet (15 mg total) by mouth every 12 (twelve) hours. 06/19/22   Doreatha Massed, MD  solifenacin (VESICARE) 5 MG tablet Take 1 tablet (5 mg total) by mouth daily. 02/14/22   Corwin Levins, MD  vitamin B-12 (CYANOCOBALAMIN) 1000 MCG tablet Take 1,000 mcg by mouth daily.    [provider]  Wheat Dextrin (BENEFIBER) CHEW Chew 3 tablets by mouth daily.    [provider]      Allergies    Ciprofloxacin, Hydrocodone bit-homatrop mbr, Prednisone, Sulfa antibiotics, Alfuzosin, Codeine, Crestor [rosuvastatin calcium], Doxycycline, Gabapentin, Keflex [cephalexin], Myrbetriq [mirabegron er], Naproxen, and Statins    Review of Systems   Review of Systems  Constitutional:  Negative for chills and fever.  HENT:  Negative for ear pain and sore throat.   Eyes:  Negative for pain and visual disturbance.  Respiratory:  Negative for cough and shortness of breath.   Cardiovascular:  Negative for chest pain and palpitations.  Gastrointestinal:  Positive for constipation, nausea and rectal pain. Negative for abdominal pain and vomiting.  Genitourinary:  Negative for dysuria and hematuria.  Musculoskeletal:  Negative for arthralgias and back pain.  Skin:  Negative for color change and rash.  Neurological:  Negative for seizures and syncope.  All other systems reviewed and are negative.   Physical Exam Updated Vital Signs BP (!) 159/67   Pulse 88   Temp 98.7 F (37.1 C)   Resp 20   Ht 1.6  m (5\' 3" )   Wt 88.5 kg   SpO2 100%   BMI 34.54 kg/m  Physical Exam Vitals and nursing note reviewed.  Constitutional:      General: She is not in acute distress.    Appearance: Normal appearance. She is well-developed.  HENT:     Head: Normocephalic and atraumatic.  Eyes:     Extraocular Movements: Extraocular movements intact.     Conjunctiva/sclera: Conjunctivae normal.     Pupils: Pupils are equal, round, and reactive to light.  Cardiovascular:     Rate and Rhythm: Normal rate and regular rhythm.     Heart sounds: No murmur heard. Pulmonary:     Effort: Pulmonary effort is normal. No respiratory distress.     Breath sounds: Normal breath sounds.  Abdominal:     Palpations: Abdomen is soft.  Tenderness: There is no abdominal tenderness.  Genitourinary:    Comments: Perianal area normal.  Rectal exam no stool in the vault.  No blood. Musculoskeletal:        General: No swelling.     Cervical back: Neck supple.  Skin:    General: Skin is warm and dry.     Capillary Refill: Capillary refill takes less than 2 seconds.  Neurological:     General: No focal deficit present.     Mental Status: She is alert and oriented to person, place, and time.  Psychiatric:        Mood and Affect: Mood normal.     ED Results / Procedures / Treatments   Labs (all labs ordered are listed, but only abnormal results are displayed) Labs Reviewed  COMPREHENSIVE METABOLIC PANEL  LIPASE, BLOOD  CBC WITH DIFFERENTIAL/PLATELET    EKG None  Radiology No results found.  Procedures Procedures    Medications Ordered in ED Medications  sodium chloride 0.9 % bolus 500 mL (has no administration in time range)  0.9 %  sodium chloride infusion (has no administration in time range)  ondansetron (ZOFRAN) injection 4 mg (has no administration in time range)    ED Course/ Medical Decision Making/ A&P                             Medical Decision Making Amount and/or Complexity of Data  Reviewed Labs: ordered. Radiology: ordered.  Risk Prescription drug management.   Because of patient's history of the recurrent ovarian cancer with lots of peritoneal adenopathy.  I will go ahead and get CT scan abdomen and pelvis get basic labs give a little bit of IV fluids and a little bit of Zofran for the nausea.  If it shows no evidence of any significant bowel obstruction we will go ahead and do rectal exam.  Patient's labs somewhat reassuring creatinine 1.38 for GFR 39 liver function test without significant abnormalities.  Lipase less than 10 CBC white count 12.6 hemoglobin 10.8 platelets are 132.  CT scan abdomen and pelvis showed constipation but no evidence of obstruction.  There is an ill-defined hypodense density in the liver not seen on June 1 scan.  So this may require some follow-up.  Also increased prominence of retrocrural lymph nodes and anterior right psoas mass.  Otherwise no significant changes.  And nonspecific subcutaneous edema in the left thigh present on the June for scan but not on the June 1 scan.  Rectal exam no stool in the vault.  However patient did have a bowel movement here prior to that rectal exam and since the CT scan and she said it was a pretty reasonable amount of stool that came out.  Also no blood on rectal exam.  Feel that patient is stable for discharge home follow-up with the cancer center [facility] has an appointment for tomorrow.  Also do recommend increasing MiraLAX use.   Final Clinical Impression(s) / ED Diagnoses Final diagnoses:  Constipation, unspecified constipation type  Rectal pain    Rx / DC Orders ED Discharge Orders     None         Vanetta Mulders, MD 06/30/22 2132    Vanetta Mulders, MD 06/30/22 2350

## 2022-07-01 ENCOUNTER — Other Ambulatory Visit (HOSPITAL_BASED_OUTPATIENT_CLINIC_OR_DEPARTMENT_OTHER): Payer: Self-pay

## 2022-07-01 ENCOUNTER — Inpatient Hospital Stay: Payer: 59

## 2022-07-01 ENCOUNTER — Inpatient Hospital Stay (HOSPITAL_BASED_OUTPATIENT_CLINIC_OR_DEPARTMENT_OTHER): Payer: 59 | Admitting: Physician Assistant

## 2022-07-01 ENCOUNTER — Encounter (HOSPITAL_COMMUNITY): Payer: Self-pay | Admitting: Hematology

## 2022-07-01 ENCOUNTER — Encounter: Payer: Self-pay | Admitting: Physician Assistant

## 2022-07-01 ENCOUNTER — Encounter: Payer: Self-pay | Admitting: Hematology

## 2022-07-01 VITALS — BP 152/80 | HR 88 | Temp 97.2°F | Resp 18

## 2022-07-01 VITALS — Wt 193.8 lb

## 2022-07-01 DIAGNOSIS — D72829 Elevated white blood cell count, unspecified: Secondary | ICD-10-CM

## 2022-07-01 DIAGNOSIS — C772 Secondary and unspecified malignant neoplasm of intra-abdominal lymph nodes: Secondary | ICD-10-CM | POA: Diagnosis not present

## 2022-07-01 DIAGNOSIS — C801 Malignant (primary) neoplasm, unspecified: Secondary | ICD-10-CM

## 2022-07-01 DIAGNOSIS — I10 Essential (primary) hypertension: Secondary | ICD-10-CM

## 2022-07-01 DIAGNOSIS — Z95828 Presence of other vascular implants and grafts: Secondary | ICD-10-CM

## 2022-07-01 DIAGNOSIS — I890 Lymphedema, not elsewhere classified: Secondary | ICD-10-CM

## 2022-07-01 DIAGNOSIS — C569 Malignant neoplasm of unspecified ovary: Secondary | ICD-10-CM

## 2022-07-01 DIAGNOSIS — C563 Malignant neoplasm of bilateral ovaries: Secondary | ICD-10-CM

## 2022-07-01 DIAGNOSIS — D696 Thrombocytopenia, unspecified: Secondary | ICD-10-CM

## 2022-07-01 DIAGNOSIS — D630 Anemia in neoplastic disease: Secondary | ICD-10-CM | POA: Diagnosis not present

## 2022-07-01 DIAGNOSIS — Z5111 Encounter for antineoplastic chemotherapy: Secondary | ICD-10-CM | POA: Diagnosis not present

## 2022-07-01 DIAGNOSIS — F32A Depression, unspecified: Secondary | ICD-10-CM | POA: Diagnosis not present

## 2022-07-01 DIAGNOSIS — K5903 Drug induced constipation: Secondary | ICD-10-CM

## 2022-07-01 DIAGNOSIS — Z5189 Encounter for other specified aftercare: Secondary | ICD-10-CM | POA: Diagnosis not present

## 2022-07-01 DIAGNOSIS — D631 Anemia in chronic kidney disease: Secondary | ICD-10-CM | POA: Diagnosis not present

## 2022-07-01 DIAGNOSIS — G893 Neoplasm related pain (acute) (chronic): Secondary | ICD-10-CM | POA: Diagnosis not present

## 2022-07-01 DIAGNOSIS — D649 Anemia, unspecified: Secondary | ICD-10-CM

## 2022-07-01 DIAGNOSIS — N189 Chronic kidney disease, unspecified: Secondary | ICD-10-CM | POA: Diagnosis not present

## 2022-07-01 DIAGNOSIS — D509 Iron deficiency anemia, unspecified: Secondary | ICD-10-CM | POA: Diagnosis not present

## 2022-07-01 DIAGNOSIS — I129 Hypertensive chronic kidney disease with stage 1 through stage 4 chronic kidney disease, or unspecified chronic kidney disease: Secondary | ICD-10-CM | POA: Diagnosis not present

## 2022-07-01 LAB — COMPREHENSIVE METABOLIC PANEL
ALT: 15 U/L (ref 0–44)
AST: 22 U/L (ref 15–41)
Albumin: 3.5 g/dL (ref 3.5–5.0)
Alkaline Phosphatase: 83 U/L (ref 38–126)
Anion gap: 9 (ref 5–15)
BUN: 21 mg/dL (ref 8–23)
CO2: 20 mmol/L — ABNORMAL LOW (ref 22–32)
Calcium: 8.9 mg/dL (ref 8.9–10.3)
Chloride: 103 mmol/L (ref 98–111)
Creatinine, Ser: 1.31 mg/dL — ABNORMAL HIGH (ref 0.44–1.00)
GFR, Estimated: 42 mL/min — ABNORMAL LOW (ref >60)
Glucose, Bld: 132 mg/dL — ABNORMAL HIGH (ref 70–99)
Potassium: 3.5 mmol/L (ref 3.5–5.1)
Sodium: 132 mmol/L — ABNORMAL LOW (ref 135–145)
Total Bilirubin: 0.4 mg/dL (ref 0.3–1.2)
Total Protein: 6.5 g/dL (ref 6.5–8.1)

## 2022-07-01 LAB — CBC WITH DIFFERENTIAL/PLATELET
Abs Immature Granulocytes: 0.1 10*3/uL — ABNORMAL HIGH (ref 0.00–0.07)
Band Neutrophils: 7 %
Basophils Absolute: 0.1 10*3/uL (ref 0.0–0.1)
Basophils Absolute: 0 10*3/uL (ref 0.0–0.1)
Basophils Relative: 0 %
Eosinophils Absolute: 0 10*3/uL (ref 0.0–0.5)
Eosinophils Relative: 2 %
Eosinophils Relative: 0 %
HCT: 32.4 % — ABNORMAL LOW (ref 36.0–46.0)
HCT: 31.7 % — ABNORMAL LOW (ref 36.0–46.0)
Hemoglobin: 10.8 g/dL — ABNORMAL LOW (ref 12.0–15.0)
Hemoglobin: 10.2 g/dL — ABNORMAL LOW (ref 12.0–15.0)
Lymphocytes Relative: 7 %
Lymphs Abs: 2 10*3/uL (ref 0.7–4.0)
Lymphs Abs: 0.8 10*3/uL (ref 0.7–4.0)
MCH: 29.1 pg (ref 26.0–34.0)
MCH: 28.7 pg (ref 26.0–34.0)
MCHC: 32.2 g/dL (ref 30.0–36.0)
MCV: 87.3 fL (ref 80.0–100.0)
MCV: 89.3 fL (ref 80.0–100.0)
Monocytes Absolute: 0.8 10*3/uL (ref 0.1–1.0)
Monocytes Relative: 10 %
Monocytes Relative: 7 %
Myelocytes: 1 %
Neutro Abs: 9.9 10*3/uL — ABNORMAL HIGH (ref 1.7–7.7)
Neutrophils Relative %: 70 %
Neutrophils Relative %: 78 %
Platelets: 117 10*3/uL — ABNORMAL LOW (ref 150–400)
RBC: 3.55 MIL/uL — ABNORMAL LOW (ref 3.87–5.11)
RDW: 15.1 % (ref 11.5–15.5)
RDW: 15 % (ref 11.5–15.5)
WBC: 12.6 10*3/uL — ABNORMAL HIGH (ref 4.0–10.5)
WBC: 11.6 10*3/uL — ABNORMAL HIGH (ref 4.0–10.5)
nRBC: 0 % (ref 0.0–0.2)
nRBC: 0 % (ref 0.0–0.2)

## 2022-07-01 LAB — MAGNESIUM: Magnesium: 1.5 mg/dL — ABNORMAL LOW (ref 1.7–2.4)

## 2022-07-01 MED ORDER — MAGNESIUM SULFATE 2 GM/50ML IV SOLN
2.0000 g | Freq: Once | INTRAVENOUS | Status: AC
  Administered 2022-07-01: 2 g via INTRAVENOUS
  Filled 2022-07-01: qty 50

## 2022-07-01 MED ORDER — MAGNESIUM OXIDE -MG SUPPLEMENT 400 (240 MG) MG PO TABS
400.0000 mg | ORAL_TABLET | Freq: Every day | ORAL | 3 refills | Status: DC
Start: 2022-07-01 — End: 2022-09-01
  Filled 2022-07-01: qty 30, 30d supply, fill #0

## 2022-07-01 MED ORDER — SODIUM CHLORIDE 0.9 % IV SOLN: Freq: Once | INTRAVENOUS | Status: DC

## 2022-07-01 MED ORDER — SODIUM CHLORIDE 0.9% FLUSH
10.0000 mL | Freq: Once | INTRAVENOUS | Status: AC
  Administered 2022-07-01: 10 mL

## 2022-07-01 MED ORDER — FUROSEMIDE 20 MG PO TABS
20.0000 mg | ORAL_TABLET | Freq: Every day | ORAL | 3 refills | Status: DC | PRN
Start: 2022-07-01 — End: 2022-09-28
  Filled 2022-07-01: qty 30, 30d supply, fill #0

## 2022-07-01 MED ORDER — LACTULOSE 10 GM/15ML PO SOLN
20.0000 g | Freq: Every day | ORAL | 2 refills | Status: AC
Start: 2022-07-01 — End: ?
  Filled 2022-07-01: qty 473, 15d supply, fill #0

## 2022-07-01 MED ORDER — SODIUM CHLORIDE 0.9% FLUSH
10.0000 mL | INTRAVENOUS | Status: DC | PRN
  Administered 2022-07-01: 10 mL via INTRAVENOUS

## 2022-07-01 MED ORDER — HEPARIN SOD (PORK) LOCK FLUSH 100 UNIT/ML IV SOLN
500.0000 [IU] | Freq: Once | INTRAVENOUS | Status: AC
  Administered 2022-07-01: 500 [IU] via INTRAVENOUS

## 2022-07-01 MED ORDER — SODIUM CHLORIDE 0.9 % IV SOLN: INTRAVENOUS | Status: DC

## 2022-07-01 MED ORDER — DOCUSATE SODIUM 100 MG PO CAPS
200.0000 mg | ORAL_CAPSULE | Freq: Two times a day (BID) | ORAL | 3 refills | Status: AC
  Filled 2022-07-01: qty 100, 25d supply, fill #0

## 2022-07-01 NOTE — Progress Notes (Signed)
Patients port flushed without difficulty.  Good blood return noted with no bruising or swelling noted at site.  Patient remains accessed for symptom management.

## 2022-07-01 NOTE — Patient Instructions (Addendum)
[facility] at [facility] **VISIT SUMMARY & IMPORTANT INSTRUCTIONS **   You were seen today by Rojelio Brenner PA-C for your symptom management visit and chemotherapy follow-up.    PAIN: Your pain is caused by your cancer and the tumor in your right lower abdomen.  I have refilled your prescription for Dilaudid (a.k.a. HYDROMORPHONE) as well as for morphine (MS Contin). Take Dilaudid (4 mg) as needed for severe pain. Wait at least 4 hours in between your doses of Dilaudid. If you are having enough pain that you need to take more than 3 doses of Dilaudid in a day, then you should start taking MS Contin (morphine) 15 mg every 12 hours for better baseline pain control.  CONSTIPATION: Your constipation is related to your cancer, chemotherapy, and pain medication. Take 2 capsules of 100 mg Colace (200 mg total) TWICE each day (200 mg in the morning and then 200 mg in the evening) Take 30 mL of lactulose every night at bedtime. If you are having frequent diarrhea, you can stop taking lactulose. If you have not had a bowel movement in more than 3 days, take 30 mL of lactulose every 3 hours until you have a bowel movement.  (If you still do not have a bowel movement after 3 doses of lactulose within the same day, please call the Cancer Center or proceed to the Emergency Department.)  LEG SWELLING: You can take Lasix (furosemide) 20 mg once a day for leg swelling. Try to keep your legs elevated to decrease swelling. See the attached handout for additional information regarding leg swelling related to cancer.  LOW MAGNESIUM Start taking magnesium 400 mg once daily.  NUTRITION & WEIGHT LOSS: Try to eat small frequent meals throughout the day. Include a healthy source of protein such as cheese, yogurt, eggs, lean meats, beans, nuts, or nut-butters.  Eat a snack at night before bedtime.  DEHYDRATION: It is very important that you remain hydrated while you are receiving  treatment!   Make sure that you are drinking at least 64 ounces of water each day.  You can drink juice, decaffeinated drinks, and sugar free beverages in addition to water, but should still drink plenty of water. Limit the amount of soda you drink.  ** Please call the Cancer Center if you have any new or worsening symptoms before your next appointment.  The Symptom Management Physician Assistant Lupe Carney Buford Dresser PA-C) can see you for same-day visits if needed.  FOLLOW-UP APPOINTMENT: Follow-up visit with Dr. Ellin Saba on 07/21/2022.  ** Thank you for trusting me with your healthcare!  I strive to provide all of my patients with quality care at each visit.  If you receive a survey for this visit, I would be so grateful to you for taking the time to provide feedback.  Thank you in advance!  ~ Amias Hutchinson                   Dr. Doreatha Massed   &   Rojelio Brenner, PA-C   - - - - - - - - - - - - - - - - - -    Thank you for choosing  Cancer Center at [facility] to provide your oncology and hematology care.  To afford each patient quality time with our provider, please arrive at least 15 minutes before your scheduled appointment time.   If you have a lab appointment with the Cancer Center please come in thru the  Main Entrance and check in at the main information desk.  You need to re-schedule your appointment should you arrive 10 or more minutes late.  We strive to give you quality time with our providers, and arriving late affects you and other patients whose appointments are after yours.  Also, if you no show three or more times for appointments you may be dismissed from the clinic at the providers discretion.     Again, thank you for choosing [facility].  Our hope is that these requests will decrease the amount of time that you wait before being seen by our physicians.       _____________________________________________________________  Should you have  questions after your visit to [facility], please contact our office at [phone] and follow the prompts.  Our office hours are 8:00 a.m. and 4:30 p.m. Monday - Friday.  Please note that voicemails left after 4:00 p.m. may not be returned until the following business day.  We are closed weekends and major holidays.  You do have access to a nurse 24-7, just call the main number to the clinic [phone] and do not press any options, hold on the line and a nurse will answer the phone.    For prescription refill requests, have your pharmacy contact our office and allow 72 hours.

## 2022-07-01 NOTE — Progress Notes (Signed)
Patient presents today for 4 grams magnesium IV per providers order.  Vital signs and labs reviewed by PA.  Patient has no new complaints at this time.  Stable during infusion without adverse affects.  Vital signs stable.  No complaints at this time.  Discharge from clinic via wheelchair in stable condition.  Alert and oriented X 3.  Follow up with [facility] as scheduled.

## 2022-07-01 NOTE — Patient Instructions (Signed)
[facility]  Discharge Instructions: Thank you for choosing [facility] to provide your oncology and hematology care.  If you have a lab appointment with the Cancer Center - please note that after April 8th, 2024, all labs will be drawn in the cancer center.  You do not have to check in or register with the main entrance as you have in the past but will complete your check-in in the cancer center.  Wear comfortable clothing and clothing appropriate for easy access to any Portacath or PICC line.   We strive to give you quality time with your provider. You may need to reschedule your appointment if you arrive late (15 or more minutes).  Arriving late affects you and other patients whose appointments are after yours.  Also, if you miss three or more appointments without notifying the office, you may be dismissed from the clinic at the provider's discretion.      For prescription refill requests, have your pharmacy contact our office and allow 72 hours for refills to be completed.    Today you received the following chemotherapy and/or immunotherapy agents Magnesium      To help prevent nausea and vomiting after your treatment, we encourage you to take your nausea medication as directed.  BELOW ARE SYMPTOMS THAT SHOULD BE REPORTED IMMEDIATELY: *FEVER GREATER THAN 100.4 F (38 C) OR HIGHER *CHILLS OR SWEATING *NAUSEA AND VOMITING THAT IS NOT CONTROLLED WITH YOUR NAUSEA MEDICATION *UNUSUAL SHORTNESS OF BREATH *UNUSUAL BRUISING OR BLEEDING *URINARY PROBLEMS (pain or burning when urinating, or frequent urination) *BOWEL PROBLEMS (unusual diarrhea, constipation, pain near the anus) TENDERNESS IN MOUTH AND THROAT WITH OR WITHOUT PRESENCE OF ULCERS (sore throat, sores in mouth, or a toothache) UNUSUAL RASH, SWELLING OR PAIN  UNUSUAL VAGINAL DISCHARGE OR ITCHING   Items with * indicate a potential emergency and should be followed up as soon as possible or go to the  Emergency Department if any problems should occur.  Please show the CHEMOTHERAPY ALERT CARD or IMMUNOTHERAPY ALERT CARD at check-in to the Emergency Department and triage nurse.  Should you have questions after your visit or need to cancel or reschedule your appointment, please contact [facility] CENTER AT [facility] [phone]  and follow the prompts.  Office hours are 8:00 a.m. to 4:30 p.m. Monday - Friday. Please note that voicemails left after 4:00 p.m. may not be returned until the following business day.  We are closed weekends and major holidays. You have access to a nurse at all times for urgent questions. Please call the main number to the clinic [phone] and follow the prompts.  For any non-urgent questions, you may also contact your provider using MyChart. We now offer e-Visits for anyone 71 and older to request care online for non-urgent symptoms. For details visit [email or web address].   Also download the MyChart app! Go to the app store, search "MyChart", open the app, select [place], and log in with your MyChart username and password.

## 2022-07-03 ENCOUNTER — Other Ambulatory Visit (HOSPITAL_BASED_OUTPATIENT_CLINIC_OR_DEPARTMENT_OTHER): Payer: Self-pay

## 2022-07-07 ENCOUNTER — Telehealth: Payer: Self-pay

## 2022-07-07 ENCOUNTER — Ambulatory Visit: Payer: Self-pay

## 2022-07-07 NOTE — Telephone Encounter (Signed)
Transition Care Management Follow-up Telephone Call Date of discharge and from where: [facility] 6/17 How have you been since you were released from the hospital? Doing fair but still sick and can't sleep Any questions or concerns? No  Items Reviewed: Did the pt receive and understand the discharge instructions provided? Yes  Medications obtained and verified? Yes  Other? No  Any new allergies since your discharge? No  Dietary orders reviewed? No Do you have support at home? No     Follow up appointments reviewed:  PCP Hospital f/u appt confirmed? No  Scheduled to see  on  @ . Specialist Hospital f/u appt confirmed? No  Scheduled to see  on  @ . Are transportation arrangements needed? No  If their condition worsens, is the pt aware to call PCP or go to the Emergency Dept.? Yes Was the patient provided with contact information for the PCP's office or ED? Yes Was to pt encouraged to call back with questions or concerns? Yes

## 2022-07-07 NOTE — Patient Instructions (Addendum)
Visit Information  Thank you for taking time to visit with me today. Please don't hesitate to contact me if I can be of assistance to you.   Following are the goals we discussed today:  Continue to take medications as prescribed Continue to attend provider visits as scheduled Contact your provider with health questions or concerns or if condition worsens as needed Continue to work at efforts to have daily bowel movements   Our next appointment is by telephone on 08/05/22 at 11:30 am  Please call the care guide team at [phone] if you need to cancel or reschedule your appointment.   If you are experiencing a Mental Health or Behavioral Health Crisis or need someone to talk to, please call the Suicide and Crisis Lifeline: 988 call 1-800-273-TALK (toll free, 24 hour hotline)  [name], RN, MSN, BSN, CCM [facility] Care Coordinator [phone]    Constipation, Adult Constipation is when a person has fewer than three bowel movements in a week, has difficulty having a bowel movement, or has stools (feces) that are dry, hard, or larger than normal. Constipation may be caused by an underlying condition. It may become worse with age if a person takes certain medicines and does not take in enough fluids. Follow these instructions at home: Eating and drinking  Eat foods that have a lot of fiber, such as beans, whole grains, and fresh fruits and vegetables. Limit foods that are low in fiber and high in fat and processed sugars, such as fried or sweet foods. These include french fries, hamburgers, cookies, candies, and soda. Drink enough fluid to keep your urine pale yellow. General instructions Exercise regularly or as told by your health care provider. Try to do 150 minutes of moderate exercise each week. Use the bathroom when you have the urge to go. Do not hold it in. Take over-the-counter and prescription medicines only as told by your health care provider. This includes any fiber  supplements. During bowel movements: Practice deep breathing while relaxing the lower abdomen. Practice pelvic floor relaxation. Watch your condition for any changes. Let your health care provider know about them. Keep all follow-up visits as told by your health care provider. This is important. Contact a health care provider if: You have pain that gets worse. You have a fever. You do not have a bowel movement after 4 days. You vomit. You are not hungry or you lose weight. You are bleeding from the opening between the buttocks (anus). You have thin, pencil-like stools. Get help right away if: You have a fever and your symptoms suddenly get worse. You leak stool or have blood in your stool. Your abdomen is bloated. You have severe pain in your abdomen. You feel dizzy or you faint. Summary Constipation is when a person has fewer than three bowel movements in a week, has difficulty having a bowel movement, or has stools (feces) that are dry, hard, or larger than normal. Eat foods that have a lot of fiber, such as beans, whole grains, and fresh fruits and vegetables. Drink enough fluid to keep your urine pale yellow. Take over-the-counter and prescription medicines only as told by your health care provider. This includes any fiber supplements. This information is not intended to replace advice given to you by your health care provider. Make sure you discuss any questions you have with your health care provider. Document Revised: 11/13/2021 Document Reviewed: 11/13/2021 Elsevier Patient Education  2024 [employer].

## 2022-07-07 NOTE — Patient Outreach (Signed)
  Care Coordination   Follow Up Visit Note   07/07/2022 Name: THEODOSIA BAHENA MRN: [id number] DOB: 1945-12-12  LIZBETT GARCIAGARCIA is a 77 y.o. year old female who sees Corwin Levins, MD for primary care. I spoke with  [name] by phone today.  What matters to the patients health and wellness today?  Ms. Frame expresses past her journey the past couple weeks with going to the ED with pain, constipation, effects of pain medication which caused bad dreams and irritability, she reports she has been unable to sleep. She states today she has had a bowel movement and is beginning to feel better. She reports she is not taking narcotics, but only acetaminophen for pain and states she is pain free at this time. She states she is going to try to get some sleep once she gets off the phone.  Goals Addressed             This Visit's Progress    Assistance with managing health concerns       Interventions Today    Flowsheet Row Most Recent Value  Chronic Disease   Chronic disease during today's visit Other  General Interventions   General Interventions Discussed/Reviewed General Interventions Reviewed, Doctor Visits  Doctor Visits Discussed/Reviewed Doctor Visits Reviewed  Education Interventions   Education Provided Provided Education  Provided Verbal Education On Medication, When to see the doctor, Nutrition, Other  [discussed importance of rest, advised patient to discuss palliaitve care with oncologist at next visit to be informed on available resources. advised to contact provider if condition worsens.]  Mental Health Interventions   Mental Health Discussed/Reviewed Mental Health Reviewed, Other  [reinforced contact LCSW for mental health needs, [phone] or [phone] talk.]  Nutrition Interventions   Nutrition Discussed/Reviewed Nutrition Reviewed  Pharmacy Interventions   Pharmacy Dicussed/Reviewed Pharmacy Topics Reviewed            SDOH assessments and interventions completed:  No  Care  Coordination Interventions:  Yes, provided   Follow up plan: Follow up call scheduled for 08/05/22    Encounter Outcome:  Pt. Visit Completed

## 2022-07-08 ENCOUNTER — Other Ambulatory Visit: Payer: Self-pay

## 2022-07-14 ENCOUNTER — Other Ambulatory Visit: Payer: Self-pay | Admitting: *Deleted

## 2022-07-14 MED ORDER — PROCHLORPERAZINE MALEATE 10 MG PO TABS: 10.0000 mg | ORAL_TABLET | Freq: Four times a day (QID) | ORAL | 2 refills | Status: AC | PRN

## 2022-07-16 ENCOUNTER — Inpatient Hospital Stay: Payer: 59

## 2022-07-16 ENCOUNTER — Ambulatory Visit (HOSPITAL_COMMUNITY): Payer: 59

## 2022-07-16 ENCOUNTER — Other Ambulatory Visit: Payer: Self-pay

## 2022-07-21 ENCOUNTER — Other Ambulatory Visit: Payer: Self-pay | Admitting: *Deleted

## 2022-07-21 ENCOUNTER — Inpatient Hospital Stay (HOSPITAL_BASED_OUTPATIENT_CLINIC_OR_DEPARTMENT_OTHER): Payer: 59 | Admitting: Hematology

## 2022-07-21 ENCOUNTER — Encounter: Payer: Self-pay | Admitting: *Deleted

## 2022-07-21 ENCOUNTER — Inpatient Hospital Stay: Payer: 59 | Admitting: Hematology

## 2022-07-21 ENCOUNTER — Inpatient Hospital Stay: Payer: 59

## 2022-07-21 ENCOUNTER — Inpatient Hospital Stay: Payer: 59 | Attending: Hematology

## 2022-07-21 VITALS — BP 172/89 | HR 81 | Temp 97.0°F | Resp 18

## 2022-07-21 VITALS — Wt 191.0 lb

## 2022-07-21 DIAGNOSIS — C772 Secondary and unspecified malignant neoplasm of intra-abdominal lymph nodes: Secondary | ICD-10-CM | POA: Insufficient documentation

## 2022-07-21 DIAGNOSIS — Z90710 Acquired absence of both cervix and uterus: Secondary | ICD-10-CM | POA: Diagnosis not present

## 2022-07-21 DIAGNOSIS — I129 Hypertensive chronic kidney disease with stage 1 through stage 4 chronic kidney disease, or unspecified chronic kidney disease: Secondary | ICD-10-CM | POA: Diagnosis not present

## 2022-07-21 DIAGNOSIS — Z95828 Presence of other vascular implants and grafts: Secondary | ICD-10-CM

## 2022-07-21 DIAGNOSIS — Z90722 Acquired absence of ovaries, bilateral: Secondary | ICD-10-CM | POA: Diagnosis not present

## 2022-07-21 DIAGNOSIS — Z5111 Encounter for antineoplastic chemotherapy: Secondary | ICD-10-CM | POA: Diagnosis not present

## 2022-07-21 DIAGNOSIS — D631 Anemia in chronic kidney disease: Secondary | ICD-10-CM | POA: Diagnosis not present

## 2022-07-21 DIAGNOSIS — N189 Chronic kidney disease, unspecified: Secondary | ICD-10-CM | POA: Insufficient documentation

## 2022-07-21 DIAGNOSIS — C569 Malignant neoplasm of unspecified ovary: Secondary | ICD-10-CM

## 2022-07-21 DIAGNOSIS — C563 Malignant neoplasm of bilateral ovaries: Secondary | ICD-10-CM

## 2022-07-21 DIAGNOSIS — D509 Iron deficiency anemia, unspecified: Secondary | ICD-10-CM | POA: Diagnosis not present

## 2022-07-21 LAB — COMPREHENSIVE METABOLIC PANEL
ALT: 11 U/L (ref 0–44)
AST: 21 U/L (ref 15–41)
Albumin: 3.7 g/dL (ref 3.5–5.0)
Alkaline Phosphatase: 69 U/L (ref 38–126)
Anion gap: 10 (ref 5–15)
BUN: 26 mg/dL — ABNORMAL HIGH (ref 8–23)
CO2: 22 mmol/L (ref 22–32)
Calcium: 9.3 mg/dL (ref 8.9–10.3)
Chloride: 102 mmol/L (ref 98–111)
Creatinine, Ser: 1.2 mg/dL — ABNORMAL HIGH (ref 0.44–1.00)
GFR, Estimated: 47 mL/min — ABNORMAL LOW (ref >60)
Glucose, Bld: 155 mg/dL — ABNORMAL HIGH (ref 70–99)
Potassium: 3.9 mmol/L (ref 3.5–5.1)
Sodium: 134 mmol/L — ABNORMAL LOW (ref 135–145)
Total Bilirubin: 0.4 mg/dL (ref 0.3–1.2)
Total Protein: 7 g/dL (ref 6.5–8.1)

## 2022-07-21 LAB — CBC WITH DIFFERENTIAL/PLATELET
Abs Immature Granulocytes: 0.03 10*3/uL (ref 0.00–0.07)
Basophils Absolute: 0 10*3/uL (ref 0.0–0.1)
Basophils Relative: 0 %
Eosinophils Absolute: 0.1 10*3/uL (ref 0.0–0.5)
Eosinophils Relative: 1 %
HCT: 34.3 % — ABNORMAL LOW (ref 36.0–46.0)
Hemoglobin: 11.1 g/dL — ABNORMAL LOW (ref 12.0–15.0)
Immature Granulocytes: 0 %
Lymphocytes Relative: 14 %
Lymphs Abs: 1 10*3/uL (ref 0.7–4.0)
MCH: 29.4 pg (ref 26.0–34.0)
MCHC: 32.4 g/dL (ref 30.0–36.0)
MCV: 91 fL (ref 80.0–100.0)
Monocytes Absolute: 0.6 10*3/uL (ref 0.1–1.0)
Monocytes Relative: 8 %
Neutro Abs: 5.3 10*3/uL (ref 1.7–7.7)
Neutrophils Relative %: 77 %
Platelets: 196 10*3/uL (ref 150–400)
RBC: 3.77 MIL/uL — ABNORMAL LOW (ref 3.87–5.11)
RDW: 17 % — ABNORMAL HIGH (ref 11.5–15.5)
WBC: 7 10*3/uL (ref 4.0–10.5)
nRBC: 0 % (ref 0.0–0.2)

## 2022-07-21 LAB — MAGNESIUM: Magnesium: 2.1 mg/dL (ref 1.7–2.4)

## 2022-07-21 MED ORDER — SODIUM CHLORIDE 0.9 % IV SOLN
150.0000 mg | Freq: Once | INTRAVENOUS | Status: AC
  Administered 2022-07-21: 150 mg via INTRAVENOUS
  Filled 2022-07-21: qty 150

## 2022-07-21 MED ORDER — SODIUM CHLORIDE 0.9 % IV SOLN
10.0000 mg | Freq: Once | INTRAVENOUS | Status: AC
  Administered 2022-07-21: 10 mg via INTRAVENOUS
  Filled 2022-07-21: qty 10

## 2022-07-21 MED ORDER — HEPARIN SOD (PORK) LOCK FLUSH 100 UNIT/ML IV SOLN
500.0000 [IU] | Freq: Once | INTRAVENOUS | Status: AC | PRN
  Administered 2022-07-21: 500 [IU]

## 2022-07-21 MED ORDER — SODIUM CHLORIDE 0.9% FLUSH
10.0000 mL | INTRAVENOUS | Status: DC | PRN
  Administered 2022-07-21: 10 mL

## 2022-07-21 MED ORDER — SODIUM CHLORIDE 0.9 % IV SOLN: Freq: Once | INTRAVENOUS | Status: AC

## 2022-07-21 MED ORDER — PALONOSETRON HCL INJECTION 0.25 MG/5ML
0.2500 mg | Freq: Once | INTRAVENOUS | Status: AC
  Administered 2022-07-21: 0.25 mg via INTRAVENOUS
  Filled 2022-07-21: qty 5

## 2022-07-21 MED ORDER — SODIUM CHLORIDE 0.9 % IV SOLN
15.0000 mg/kg | Freq: Once | INTRAVENOUS | Status: AC
  Administered 2022-07-21: 1300 mg via INTRAVENOUS
  Filled 2022-07-21: qty 48

## 2022-07-21 MED ORDER — CETIRIZINE HCL 10 MG/ML IV SOLN
10.0000 mg | Freq: Once | INTRAVENOUS | Status: AC
  Administered 2022-07-21: 10 mg via INTRAVENOUS
  Filled 2022-07-21: qty 1

## 2022-07-21 MED ORDER — SODIUM CHLORIDE 0.9 % IV SOLN
380.0000 mg | Freq: Once | INTRAVENOUS | Status: AC
  Administered 2022-07-21: 380 mg via INTRAVENOUS
  Filled 2022-07-21: qty 38

## 2022-07-21 MED ORDER — SODIUM CHLORIDE 0.9 % IV SOLN
140.0000 mg/m2 | Freq: Once | INTRAVENOUS | Status: AC
  Administered 2022-07-21: 276 mg via INTRAVENOUS
  Filled 2022-07-21: qty 46

## 2022-07-21 MED ORDER — FAMOTIDINE IN NACL 20-0.9 MG/50ML-% IV SOLN
20.0000 mg | Freq: Once | INTRAVENOUS | Status: AC
  Administered 2022-07-21: 20 mg via INTRAVENOUS
  Filled 2022-07-21: qty 50

## 2022-07-21 NOTE — Patient Instructions (Addendum)
[facility] at [facility] Discharge Instructions   You were seen and examined today by Dr. Ellin Saba.  He reviewed the results of your lab work which are normal/stable.   We will proceed with your treatment today.   You may take the hydromorphone pain pills every 6 hours as needed for pain.   Increase amlodipine to 2 pills daily. Take both pills at the same time.   Return as scheduled.    Thank you for choosing [facility] at [facility] to provide your oncology and hematology care.  To afford each patient quality time with our provider, please arrive at least 15 minutes before your scheduled appointment time.   If you have a lab appointment with the Cancer Center please come in thru the Main Entrance and check in at the main information desk.  You need to re-schedule your appointment should you arrive 10 or more minutes late.  We strive to give you quality time with our providers, and arriving late affects you and other patients whose appointments are after yours.  Also, if you no show three or more times for appointments you may be dismissed from the clinic at the providers discretion.     Again, thank you for choosing [facility].  Our hope is that these requests will decrease the amount of time that you wait before being seen by our physicians.       _____________________________________________________________  Should you have questions after your visit to [facility], please contact our office at [phone] and follow the prompts.  Our office hours are 8:00 a.m. and 4:30 p.m. Monday - Friday.  Please note that voicemails left after 4:00 p.m. may not be returned until the following business day.  We are closed weekends and major holidays.  You do have access to a nurse 24-7, just call the main number to the clinic [phone] and do not press any options, hold on the line and a nurse will answer the phone.     For prescription refill requests, have your pharmacy contact our office and allow 72 hours.    Due to Covid, you will need to wear a mask upon entering the hospital. If you do not have a mask, a mask will be given to you at the Main Entrance upon arrival. For doctor visits, patients may have 1 support person age 50 or older with them. For treatment visits, patients can not have anyone with them due to social distancing guidelines and our immunocompromised population.

## 2022-07-21 NOTE — Progress Notes (Signed)
Patient presents today for chemotherapy infusion. Patient is in satisfactory condition with no new complaints voiced.  Vital signs are stable.  Labs reviewed by Dr. Katragadda during the office visit and all labs are within treatment parameters.  We will proceed with treatment per MD orders.   Patient tolerated treatment well with no complaints voiced.  Patient left via wheelchair in stable condition.  Vital signs stable at discharge.  Follow up as scheduled.    

## 2022-07-21 NOTE — Progress Notes (Signed)
Patient has been examined by Dr. Katragadda. Vital signs and labs have been reviewed by MD - ANC, Creatinine, LFTs, hemoglobin, and platelets are within treatment parameters per M.D. - pt may proceed with treatment.  Primary RN and pharmacy notified.  

## 2022-07-21 NOTE — Patient Instructions (Signed)
[facility] AT [facility]  Discharge Instructions: Thank you for choosing [facility] to provide your oncology and hematology care.  If you have a lab appointment with the Cancer Center - please note that after April 8th, 2024, all labs will be drawn in the cancer center.  You do not have to check in or register with the main entrance as you have in the past but will complete your check-in in the cancer center.  Wear comfortable clothing and clothing appropriate for easy access to any Portacath or PICC line.   We strive to give you quality time with your provider. You may need to reschedule your appointment if you arrive late (15 or more minutes).  Arriving late affects you and other patients whose appointments are after yours.  Also, if you miss three or more appointments without notifying the office, you may be dismissed from the clinic at the provider's discretion.      For prescription refill requests, have your pharmacy contact our office and allow 72 hours for refills to be completed.    Today you received the following chemotherapy and/or immunotherapy agents Avastin/Taxol/Carboplatin.  Bevacizumab Injection What is this medication? BEVACIZUMAB (be va SIZ yoo mab) treats some types of cancer. It works by blocking a protein that causes cancer cells to grow and multiply. This helps to slow or stop the spread of cancer cells. It is a monoclonal antibody. This medicine may be used for other purposes; ask your health care provider or pharmacist if you have questions. COMMON BRAND NAME(S): Alymsys, Avastin, MVASI, Omer Jack What should I tell my care team before I take this medication? They need to know if you have any of these conditions: Blood clots Coughing up blood Having or recent surgery Heart failure High blood pressure History of a connection between 2 or more body parts that do not usually connect (fistula) History of a tear in your stomach or  intestines Protein in your urine An unusual or allergic reaction to bevacizumab, other medications, foods, dyes, or preservatives Pregnant or trying to get pregnant Breast-feeding How should I use this medication? This medication is injected into a vein. It is given by your care team in a hospital or clinic setting. Talk to your care team the use of this medication in children. Special care may be needed. Overdosage: If you think you have taken too much of this medicine contact a poison control center or emergency room at once. NOTE: This medicine is only for you. Do not share this medicine with others. What if I miss a dose? Keep appointments for follow-up doses. It is important not to miss your dose. Call your care team if you are unable to keep an appointment. What may interact with this medication? Interactions are not expected. This list may not describe all possible interactions. Give your health care provider a list of all the medicines, herbs, non-prescription drugs, or dietary supplements you use. Also tell them if you smoke, drink alcohol, or use illegal drugs. Some items may interact with your medicine. What should I watch for while using this medication? Your condition will be monitored carefully while you are receiving this medication. You may need blood work while taking this medication. This medication may make you feel generally unwell. This is not uncommon as chemotherapy can affect healthy cells as well as cancer cells. Report any side effects. Continue your course of treatment even though you feel ill unless your care team tells you to stop. This  medication may increase your risk to bruise or bleed. Call your care team if you notice any unusual bleeding. Before having surgery, talk to your care team to make sure it is ok. This medication can increase the risk of poor healing of your surgical site or wound. You will need to stop this medication for 28 days before surgery. After  surgery, wait at least 28 days before restarting this medication. Make sure the surgical site or wound is healed enough before restarting this medication. Talk to your care team if questions. Talk to your care team if you may be pregnant. Serious birth defects can occur if you take this medication during pregnancy and for 6 months after the last dose. Contraception is recommended while taking this medication and for 6 months after the last dose. Your care team can help you find the option that works for you. Do not breastfeed while taking this medication and for 6 months after the last dose. This medication can cause infertility. Talk to your care team if you are concerned about your fertility. What side effects may I notice from receiving this medication? Side effects that you should report to your care team as soon as possible: Allergic reactions--skin rash, itching, hives, swelling of the face, lips, tongue, or throat Bleeding--bloody or black, tar-like stools, vomiting blood or Kobyn Kray material that looks like coffee grounds, red or dark Dameshia Seybold urine, small red or purple spots on skin, unusual bruising or bleeding Blood clot--pain, swelling, or warmth in the leg, shortness of breath, chest pain Heart attack--pain or tightness in the chest, shoulders, arms, or jaw, nausea, shortness of breath, cold or clammy skin, feeling faint or lightheaded Heart failure--shortness of breath, swelling of the ankles, feet, or hands, sudden weight gain, unusual weakness or fatigue Increase in blood pressure Infection--fever, chills, cough, sore throat, wounds that don't heal, pain or trouble when passing urine, general feeling of discomfort or being unwell Infusion reactions--chest pain, shortness of breath or trouble breathing, feeling faint or lightheaded Kidney injury--decrease in the amount of urine, swelling of the ankles, hands, or feet Stomach pain that is severe, does not go away, or gets worse Stroke--sudden  numbness or weakness of the face, arm, or leg, trouble speaking, confusion, trouble walking, loss of balance or coordination, dizziness, severe headache, change in vision Sudden and severe headache, confusion, change in vision, seizures, which may be signs of posterior reversible encephalopathy syndrome (PRES) Side effects that usually do not require medical attention (report to your care team if they continue or are bothersome): Back pain Change in taste Diarrhea Dry skin Increased tears Nosebleed This list may not describe all possible side effects. Call your doctor for medical advice about side effects. You may report side effects to FDA at 1-800-FDA-1088. Where should I keep my medication? This medication is given in a hospital or clinic. It will not be stored at home. NOTE: This sheet is a summary. It may not cover all possible information. If you have questions about this medicine, talk to your doctor, pharmacist, or health care provider.  2024 Elsevier/Gold Standard (2021-05-17 00:00:00)    Paclitaxel Injection What is this medication? PACLITAXEL (PAK li TAX el) treats some types of cancer. It works by slowing down the growth of cancer cells. This medicine may be used for other purposes; ask your health care provider or pharmacist if you have questions. COMMON BRAND NAME(S): Onxol, Taxol What should I tell my care team before I take this medication? They need to know  if you have any of these conditions: Heart disease Liver disease Low white blood cell levels An unusual or allergic reaction to paclitaxel, other medications, foods, dyes, or preservatives If you or your partner are pregnant or trying to get pregnant Breast-feeding How should I use this medication? This medication is injected into a vein. It is given by your care team in a hospital or clinic setting. Talk to your care team about the use of this medication in children. While it may be given to children for selected  conditions, precautions do apply. Overdosage: If you think you have taken too much of this medicine contact a poison control center or emergency room at once. NOTE: This medicine is only for you. Do not share this medicine with others. What if I miss a dose? Keep appointments for follow-up doses. It is important not to miss your dose. Call your care team if you are unable to keep an appointment. What may interact with this medication? Do not take this medication with any of the following: Live virus vaccines Other medications may affect the way this medication works. Talk with your care team about all of the medications you take. They may suggest changes to your treatment plan to lower the risk of side effects and to make sure your medications work as intended. This list may not describe all possible interactions. Give your health care provider a list of all the medicines, herbs, non-prescription drugs, or dietary supplements you use. Also tell them if you smoke, drink alcohol, or use illegal drugs. Some items may interact with your medicine. What should I watch for while using this medication? Your condition will be monitored carefully while you are receiving this medication. You may need blood work while taking this medication. This medication may make you feel generally unwell. This is not uncommon as chemotherapy can affect healthy cells as well as cancer cells. Report any side effects. Continue your course of treatment even though you feel ill unless your care team tells you to stop. This medication can cause serious allergic reactions. To reduce the risk, your care team may give you other medications to take before receiving this one. Be sure to follow the directions from your care team. This medication may increase your risk of getting an infection. Call your care team for advice if you get a fever, chills, sore throat, or other symptoms of a cold or flu. Do not treat yourself. Try to avoid  being around people who are sick. This medication may increase your risk to bruise or bleed. Call your care team if you notice any unusual bleeding. Be careful brushing or flossing your teeth or using a toothpick because you may get an infection or bleed more easily. If you have any dental work done, tell your dentist you are receiving this medication. Talk to your care team if you may be pregnant. Serious birth defects can occur if you take this medication during pregnancy. Talk to your care team before breastfeeding. Changes to your treatment plan may be needed. What side effects may I notice from receiving this medication? Side effects that you should report to your care team as soon as possible: Allergic reactions--skin rash, itching, hives, swelling of the face, lips, tongue, or throat Heart rhythm changes--fast or irregular heartbeat, dizziness, feeling faint or lightheaded, chest pain, trouble breathing Increase in blood pressure Infection--fever, chills, cough, sore throat, wounds that don't heal, pain or trouble when passing urine, general feeling of discomfort or being unwell Low  blood pressure--dizziness, feeling faint or lightheaded, blurry vision Low red blood cell level--unusual weakness or fatigue, dizziness, headache, trouble breathing Painful swelling, warmth, or redness of the skin, blisters or sores at the infusion site Pain, tingling, or numbness in the hands or feet Slow heartbeat--dizziness, feeling faint or lightheaded, confusion, trouble breathing, unusual weakness or fatigue Unusual bruising or bleeding Side effects that usually do not require medical attention (report to your care team if they continue or are bothersome): Diarrhea Hair loss Joint pain Loss of appetite Muscle pain Nausea Vomiting This list may not describe all possible side effects. Call your doctor for medical advice about side effects. You may report side effects to FDA at 1-800-FDA-1088. Where  should I keep my medication? This medication is given in a hospital or clinic. It will not be stored at home. NOTE: This sheet is a summary. It may not cover all possible information. If you have questions about this medicine, talk to your doctor, pharmacist, or health care provider.  2024 Elsevier/Gold Standard (2021-05-21 00:00:00)    Carboplatin Injection What is this medication? CARBOPLATIN (KAR boe pla tin) treats some types of cancer. It works by slowing down the growth of cancer cells. This medicine may be used for other purposes; ask your health care provider or pharmacist if you have questions. COMMON BRAND NAME(S): Paraplatin What should I tell my care team before I take this medication? They need to know if you have any of these conditions: Blood disorders Hearing problems Kidney disease Recent or ongoing radiation therapy An unusual or allergic reaction to carboplatin, cisplatin, other medications, foods, dyes, or preservatives Pregnant or trying to get pregnant Breast-feeding How should I use this medication? This medication is injected into a vein. It is given by your care team in a hospital or clinic setting. Talk to your care team about the use of this medication in children. Special care may be needed. Overdosage: If you think you have taken too much of this medicine contact a poison control center or emergency room at once. NOTE: This medicine is only for you. Do not share this medicine with others. What if I miss a dose? Keep appointments for follow-up doses. It is important not to miss your dose. Call your care team if you are unable to keep an appointment. What may interact with this medication? Medications for seizures Some antibiotics, such as amikacin, gentamicin, neomycin, streptomycin, tobramycin Vaccines This list may not describe all possible interactions. Give your health care provider a list of all the medicines, herbs, non-prescription drugs, or dietary  supplements you use. Also tell them if you smoke, drink alcohol, or use illegal drugs. Some items may interact with your medicine. What should I watch for while using this medication? Your condition will be monitored carefully while you are receiving this medication. You may need blood work while taking this medication. This medication may make you feel generally unwell. This is not uncommon, as chemotherapy can affect healthy cells as well as cancer cells. Report any side effects. Continue your course of treatment even though you feel ill unless your care team tells you to stop. In some cases, you may be given additional medications to help with side effects. Follow all directions for their use. This medication may increase your risk of getting an infection. Call your care team for advice if you get a fever, chills, sore throat, or other symptoms of a cold or flu. Do not treat yourself. Try to avoid being around people who  are sick. Avoid taking medications that contain aspirin, acetaminophen, ibuprofen, naproxen, or ketoprofen unless instructed by your care team. These medications may hide a fever. Be careful brushing or flossing your teeth or using a toothpick because you may get an infection or bleed more easily. If you have any dental work done, tell your dentist you are receiving this medication. Talk to your care team if you wish to become pregnant or think you might be pregnant. This medication can cause serious birth defects. Talk to your care team about effective forms of contraception. Do not breast-feed while taking this medication. What side effects may I notice from receiving this medication? Side effects that you should report to your care team as soon as possible: Allergic reactions--skin rash, itching, hives, swelling of the face, lips, tongue, or throat Infection--fever, chills, cough, sore throat, wounds that don't heal, pain or trouble when passing urine, general feeling of  discomfort or being unwell Low red blood cell level--unusual weakness or fatigue, dizziness, headache, trouble breathing Pain, tingling, or numbness in the hands or feet, muscle weakness, change in vision, confusion or trouble speaking, loss of balance or coordination, trouble walking, seizures Unusual bruising or bleeding Side effects that usually do not require medical attention (report to your care team if they continue or are bothersome): Hair loss Nausea Unusual weakness or fatigue Vomiting This list may not describe all possible side effects. Call your doctor for medical advice about side effects. You may report side effects to FDA at 1-800-FDA-1088. Where should I keep my medication? This medication is given in a hospital or clinic. It will not be stored at home. NOTE: This sheet is a summary. It may not cover all possible information. If you have questions about this medicine, talk to your doctor, pharmacist, or health care provider.  2024 Elsevier/Gold Standard (2021-04-23 00:00:00)        To help prevent nausea and vomiting after your treatment, we encourage you to take your nausea medication as directed.  BELOW ARE SYMPTOMS THAT SHOULD BE REPORTED IMMEDIATELY: *FEVER GREATER THAN 100.4 F (38 C) OR HIGHER *CHILLS OR SWEATING *NAUSEA AND VOMITING THAT IS NOT CONTROLLED WITH YOUR NAUSEA MEDICATION *UNUSUAL SHORTNESS OF BREATH *UNUSUAL BRUISING OR BLEEDING *URINARY PROBLEMS (pain or burning when urinating, or frequent urination) *BOWEL PROBLEMS (unusual diarrhea, constipation, pain near the anus) TENDERNESS IN MOUTH AND THROAT WITH OR WITHOUT PRESENCE OF ULCERS (sore throat, sores in mouth, or a toothache) UNUSUAL RASH, SWELLING OR PAIN  UNUSUAL VAGINAL DISCHARGE OR ITCHING   Items with * indicate a potential emergency and should be followed up as soon as possible or go to the Emergency Department if any problems should occur.  Please show the CHEMOTHERAPY ALERT CARD or  IMMUNOTHERAPY ALERT CARD at check-in to the Emergency Department and triage nurse.  Should you have questions after your visit or need to cancel or reschedule your appointment, please contact [facility] [phone]  and follow the prompts.  Office hours are 8:00 a.m. to 4:30 p.m. Monday - Friday. Please note that voicemails left after 4:00 p.m. may not be returned until the following business day.  We are closed weekends and major holidays. You have access to a nurse at all times for urgent questions. Please call the main number to the clinic [phone] and follow the prompts.  For any non-urgent questions, you may also contact your provider using MyChart. We now offer e-Visits for anyone 48 and older to request care online for non-urgent  symptoms. For details visit [email or web address].   Also download the MyChart app! Go to the app store, search "MyChart", open the app, select , and log in with your MyChart username and password.

## 2022-07-21 NOTE — Progress Notes (Signed)
[facility] [address], [place] [address]    Clinic Day:  07/21/2022  Referring physician: Corwin Levins, MD  Patient Care Team: Corwin Levins, MD as PCP - General (Internal Medicine) Parke Poisson, MD as PCP - Cardiology (Cardiology) Rachael Fee, MD as Attending Physician (Gastroenterology) Christia Reading, MD as Attending Physician (Otolaryngology) [place], Peter M, MD (Cardiology) Kerrin Champagne, MD (Inactive) as Consulting Physician (Orthopedic Surgery) Doreatha Massed, MD as Medical Oncologist (Oncology) Mickie Bail, RN as Oncology Nurse Navigator (Oncology) Soundra Pilon, LCSW as Social Worker (Licensed Clinical Social Worker) Colletta Maryland, RN as [facility] Care Management   ASSESSMENT & PLAN:   Assessment: 1.  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  Mass measures 6 x 5.8 cm and is partially calcified.  Separate solid component superior to this in the left periaortic region measures 3.5 cm, SUV 14.3.  Cystic component medial to the lower pole of the left kidney is without hypermetabolic activity, possibly a lymphocele. -CT-guided biopsy of the right retroperitoneal lymph node consistent with adenocarcinoma with psammoma bodies.  Morphology and immunotherapy consistent with ovarian serous carcinoma/primary peritoneal carcinoma. -Germline mutation testing was negative. -Foundation 1 testing shows MS-stable.  Loss of heterozygosity was less than 16%. -CA-125 346 on 06/23/2019. - 6 cycles of carboplatin and paclitaxel from 07/28/2019 through 11/17/2019 -CTAP on 10/04/2019 after 3 cycles of chemotherapy showed retroperitoneal adenopathy at the bifurcation measuring 5.2 x 2.9 cm, left para-aortic lymphadenopathy measuring 5 x 4.4 cm, both of them decreased in size when compared to most recent PET  scan.  CT scan report says that one of the lesion has gotten bigger but this was compared to CT scan from 04/02/2019. -PET scan on December 05, 2019 shows 4.8 x 4.2 cm fluid filled density in the left para-aortic region, non-FDG avid favoring benign retroperitoneal cyst versus lymphangioma.  Right/anterior para-aortic mixed cystic/solid lesions measuring 2.4 x 4.8 cm, SUV 2.5, previous SUV 19.5. -Robotic assisted laparoscopic total hysterectomy and bilateral salpingo-oophorectomy by Dr. Andrey Farmer on 01/03/2020. -Pathology showed soft tissue deformities 2 sites and psammomatous calcifications and chronic inflammation with no malignancy identified in the para-aortic lymph node.  Right salpingo-oophorectomy showed microscopic focus of residual adenocarcinoma less than 1 mm.  No malignancy in the left ovary.  [id number]. -As she had prior difficulty with chemotherapy, no further chemotherapy after surgery was recommended. - XRT to retroperitoneal nodal mass (IMRT) from 01/21/2021 through 02/01/2021.  10 fractions, 40 Gray. - Right ureteral stent placement on 03/13/2021 due to right hydronephrosis from retroperitoneal nodal mass. - CTAP (04/01/2021): Centrally necrotic lymph node has increased in size. - We discussed CT findings and reviewed images.  Last tumor marker was normal. - She complained of right lower quadrant pain radiating to the back which started on 05/11/2021 and ended on 05/13/2021 night. - 6 cycles of dose reduced carboplatin and paclitaxel from 05/22/2021 through 09/04/2021. - Germline mutation testing negative - Carboplatin and paclitaxel restarted on 06/24/2022 for recurrence, bevacizumab added with cycle 2 on 07/21/2022.  Plan: 1.  Recurrent ovarian serous carcinoma: - CT CAP on 06/14/2022 showed interval increase in size and number of retroperitoneal lymph nodes consistent with progression. - She tolerated cycle 1 on 06/24/2022 reasonably well.  She felt weak. - Labs: Normal LFTs.  Creatinine 1.20.  CBC  grossly normal.  CA125 is 7.8. - Reviewed  CTAP from 06/30/2022 from ER visit.  Questionable 3 x 2.1 x 2.6 cm ill-defined hypodensity in segment 4 of the liver not seen on 06/14/2022, most likely aberrant venous drainage. - She will proceed with cycle 2 today with same doses.  Will add bevacizumab today at 15 mg/kg dose.  Discussed side effects including hypotension, bleeding risk and proteinuria. - I have offered Ritalin for chemotherapy-induced fatigue at a lower dose.  She is reluctant to consider it as it may cause worsening anxiety. - I will discontinue G-CSF today.  RTC 3 weeks for follow-up.    2.  Hypertension: - She is taking amlodipine 5 mg daily.  Blood pressure is 147/97.  As we are starting bevacizumab, recommend increasing amlodipine to 10 mg daily.  3.  Normocytic anemia: - Normocytic anemia from CKD, functional iron deficiency and myelosuppression.  Hemoglobin today is 11.1.   4.  Back and right groin pain: - She reports pain in the right groin to right loin 1 week ago. - She is taking Dilaudid 4 mg 1 to 2 tablets daily. - Lidocaine patch did not help. - Continue Dilaudid 4 mg, may increase to every 6 hours as needed.  Use lactulose for constipation as needed.  Orders Placed This Encounter  Procedures   Magnesium    Standing Status:   Future    Standing Expiration Date:   10/07/2023   CBC with Differential    Standing Status:   Future    Standing Expiration Date:   10/07/2023   Comprehensive metabolic panel    Standing Status:   Future    Standing Expiration Date:   10/07/2023   Urinalysis, dipstick only    Standing Status:   Future    Standing Expiration Date:   10/07/2023   Magnesium    Standing Status:   Future    Standing Expiration Date:   10/28/2023   CBC with Differential    Standing Status:   Future    Standing Expiration Date:   10/28/2023   Comprehensive metabolic panel    Standing Status:   Future    Standing Expiration Date:   10/28/2023   Magnesium     Standing Status:   Future    Standing Expiration Date:   11/18/2023   CBC with Differential    Standing Status:   Future    Standing Expiration Date:   11/18/2023   Comprehensive metabolic panel    Standing Status:   Future    Standing Expiration Date:   11/18/2023      Mikeal Hawthorne R Teague,acting as a scribe for Doreatha Massed, MD.,have documented all relevant documentation on the behalf of Doreatha Massed, MD,as directed by  Doreatha Massed, MD while in the presence of Doreatha Massed, MD.   I, Doreatha Massed MD, have reviewed the above documentation for accuracy and completeness, and I agree with the above.   Doreatha Massed, MD   7/8/20245:51 PM  CHIEF COMPLAINT:   Diagnosis: Recurrent serous ovarian cancer.   Cancer Staging  No matching staging information was found for the patient.   Prior Therapy: Chemotherapy  Current Therapy: Carboplatin and paclitaxel and bevacizumab   HISTORY OF PRESENT ILLNESS:   Oncology History  Malignant neoplasm of both ovaries  06/20/2019 Initial Diagnosis   Primary ovarian adenocarcinoma, unspecified laterality (HCC)   07/01/2019 Genetic Testing   Foundation One     07/11/2019 Genetic Testing   Negative genetic testing:  No pathogenic variants detected on the [facility] Multi-Cancer Panel. The  report date is 07/11/2019.  The Multi-Cancer Panel offered by [facility] includes sequencing and/or deletion duplication testing of the following 85 genes: AIP, ALK, APC, ATM, AXIN2,BAP1,  BARD1, BLM, BMPR1A, BRCA1, BRCA2, BRIP1, CASR, CDC73, CDH1, CDK4, CDKN1B, CDKN1C, CDKN2A (p14ARF), CDKN2A (p16INK4a), CEBPA, CHEK2, CTNNA1, DICER1, DIS3L2, EGFR (c.2369C>T, p.Thr790Met variant only), EPCAM (Deletion/duplication testing only), FH, FLCN, GATA2, GPC3, GREM1 (Promoter region deletion/duplication testing only), HOXB13 (c.251G>A, p.Gly84Glu), HRAS, KIT, MAX, MEN1, MET, MITF (c.952G>A, p.Glu318Lys variant only), MLH1, MSH2, MSH3, MSH6,  MUTYH, NBN, NF1, NF2, NTHL1, PALB2, PDGFRA, PHOX2B, PMS2, POLD1, POLE, POT1, PRKAR1A, PTCH1, PTEN, RAD50, RAD51C, RAD51D, RB1, RECQL4, RET, RNF43, RUNX1, SDHAF2, SDHA (sequence changes only), SDHB, SDHC, SDHD, SMAD4, SMARCA4, SMARCB1, SMARCE1, STK11, SUFU, TERC, TERT, TMEM127, TP53, TSC1, TSC2, VHL, WRN and WT1.   07/28/2019 - 09/04/2021 Chemotherapy   Patient is on Treatment Plan : OVARIAN Carboplatin (AUC 6) / Paclitaxel (175) q21d x 6 cycles     06/24/2022 -  Chemotherapy   Patient is on Treatment Plan : OVARIAN Carboplatin + Paclitaxel + Bevacizumab q21d         INTERVAL HISTORY:   Laura [name] is a 77 y.o. female presenting to clinic today for follow up of malignant neoplasm. She was last seen by me on 06/19/2022.  Her appetite level is at 85%. Her energy level is at 0%.  She c/o of pain in her right lower hip that radiates to her lower back.   She c/o weakness, extreme fatigue, and pain after treatment. She reports she can be active for 5 minutes before she has to lie down and rest. Her pain is waxing and waning every 8 hours or so. She notes she had to take pain medication after a couple of days of treatment and has been taking them for 2 weeks since starting treatment. She takes between 1-2 pills every 24 hours. Pain medications make her have "crazy dreams". She notes she also has lidocaine patches, which she reports does not improve pain symptoms. She also notes her right leg swelling the day after injections.   She c/o of mild bilateral LE edema, which improves when she keeps her legs propped up.   She notes mild constipation from her pain medication. She notes she felt restless the day before treatment and that she took sleep medications.  She notes that blood pressure problems run in the family, particularly her sister.   PAST MEDICAL HISTORY:   Past Medical History: Past Medical History:  Diagnosis Date   Anemia    Anxiety    Arthritis    back of neck, bones spurs on neck   Cancer  (HCC)    ovarian cancer   Cataract both eyes    Cervical disc disease    Diabetes mellitus Type 2    Family history of adverse reaction to anesthesia    sister slow to awaken   Family history of thyroid cancer    GERD (gastroesophageal reflux disease)    Heart murmur    History of COVID-19 06/2020   pain in side took paxlovid x 5 days all symptoms resolved   History of radiation therapy    abdominal lymph nodes 01/21/2021-02/01/2021  Dr Antony Blackbird   Hydronephrosis 08/24/2020   Right Kidney (stable per CT)   Hyperlipidemia    Hypertension    IBS (irritable bowel syndrome)    Left shoulder frozen with limited rom    Mucoid cyst of joint    right thumb   Neuropathy 03/12/2021   hands/fingers  both hands numb and tingle @ times   Port-A-Cath in place 07/21/2019   Reflux    Sleep apnea 03/12/2021   has not used cpap in 3 years   Vertigo 12/2010   none since treated at [facility]   Wears glasses for reading    Wears partial dentures upper     Surgical History: Past Surgical History:  Procedure Laterality Date   BLADDER SURGERY     x 3 at [facility]   BREAST EXCISIONAL BIOPSY Bilateral    BREAST SURGERY     fibroid cyst removed ? over 10 yrs ago at [facility] per pt on 03-12-2021   CHOLECYSTECTOMY     yrs ago   COLONOSCOPY  07/02/2020   and 09-19-2016   CYSTOSCOPY W/ URETERAL STENT PLACEMENT Right 03/13/2021   Procedure: CYSTOSCOPY WITH RETROGRADE PYELOGRAM/URETERAL STENT PLACEMENT;  Surgeon: Noel Christmas, MD;  Location: [facility];  Service: Urology;  Laterality: Right;  30 MINS   CYSTOSCOPY W/ URETERAL STENT PLACEMENT Right 10/01/2021   Procedure: CYSTOSCOPY WITH RETROGRADE PYELOGRAM/URETERAL STENT REPLACEMENT;  Surgeon: Noel Christmas, MD;  Location: [facility];  Service: Urology;  Laterality: Right;   CYSTOSCOPY W/ URETERAL STENT PLACEMENT Bilateral 03/18/2022   Procedure: CYSTOSCOPY WITH BILATERAL RETROGRADE PYELOGRAM/RIGHT URETERAL STENT EXCHANGE;   Surgeon: Noel Christmas, MD;  Location: [facility];  Service: Urology;  Laterality: Bilateral;  1HR   CYSTOSCOPY WITH RETROGRADE PYELOGRAM, URETEROSCOPY AND STENT PLACEMENT Right 05/06/2022   Procedure: CYSTOSCOPY WITH RIGHT RETROGRADE PYELOGRAM, AND RIGHT URETERAL STENT EXCHANGE;  Surgeon: Noel Christmas, MD;  Location: [facility];  Service: Urology;  Laterality: Right;   fibroids removed     breast (both breasts) age 44 and total of 3 surgeries   history of chemotherapy     6 rounds june 2021   IR IMAGING GUIDED PORT INSERTION  07/26/2019   right   MASS EXCISION Right 06/26/2016   Procedure: EXCISION MUCOID TUMOR RIGHT THUMB, IP RIGHT THUMB;  Surgeon: Cindee Salt, MD;  Location: [facility];  Service: Orthopedics;  Laterality: Right;   ROBOTIC ASSISTED BILATERAL SALPINGO OOPHERECTOMY N/A 01/03/2020   Procedure: XI ROBOTIC ASSISTED BILATERAL SALPINGO OOPHORECTOMY, RADICAL TUMOR DEBULKING;  Surgeon: Adolphus Birchwood, MD;  Location: [facility];  Service: Gynecology;  Laterality: N/A;   ROBOTIC PELVIC AND PARA-AORTIC LYMPH NODE DISSECTION N/A 01/03/2020   Procedure: XI ROBOTIC PARA-AORTIC LYMPHADENECTOMY;  Surgeon: Adolphus Birchwood, MD;  Location: [facility];  Service: Gynecology;  Laterality: N/A;   VAGINAL HYSTERECTOMY     age late 72's    Social History: Social History   Socioeconomic History   Marital status: Divorced    Spouse name: Not on file   Number of children: 4   Years of education: 16   Highest education level: Bachelor's degree (e.g., BA, AB, BS)  Occupational History   Occupation: retired Photographer  Tobacco Use   Smoking status: Never   Smokeless tobacco: Never  Vaping Use   Vaping Use: Never used  Substance and Sexual Activity   Alcohol use: No    Alcohol/week: 0.0 standard drinks of alcohol   Drug use: No   Sexual activity: Not Currently    Birth control/protection: Surgical  Other Topics Concern   Not on file  Social History Narrative   Not on file    Social Determinants of Health   Financial Resource Strain: Low Risk  (04/01/2022)   Overall Financial Resource Strain (CARDIA)    Difficulty  of Paying Living Expenses: Not hard at all  Food Insecurity: Patient Declined (06/15/2022)   Hunger Vital Sign    Worried About Running Out of Food in the Last Year: Patient declined    Ran Out of Food in the Last Year: Patient declined  Transportation Needs: No Transportation Needs (06/25/2022)   PRAPARE - Administrator, Civil Service (Medical): No    Lack of Transportation (Non-Medical): No  Physical Activity: Unknown (04/01/2022)   Exercise Vital Sign    Days of Exercise per Week: 0 days    Minutes of Exercise per Session: Not on file  Recent Concern: Physical Activity - Inactive (04/01/2022)   Exercise Vital Sign    Days of Exercise per Week: 0 days    Minutes of Exercise per Session: 10 min  Stress: No Stress Concern Present (04/01/2022)   [employer] of Occupational Health - Occupational Stress Questionnaire    Feeling of Stress : Only a little  Recent Concern: Stress - Stress Concern Present (03/21/2022)   [employer] of Occupational Health - Occupational Stress Questionnaire    Feeling of Stress : Very much  Social Connections: Moderately Integrated (04/01/2022)   Social Connection and Isolation Panel [NHANES]    Frequency of Communication with Friends and Family: More than three times a week    Frequency of Social Gatherings with Friends and Family: Three times a week    Attends Religious Services: More than 4 times per year    Active Member of Clubs or Organizations: Yes    Attends Banker Meetings: More than 4 times per year    Marital Status: Divorced  Intimate Partner Violence: Not At Risk (06/15/2022)   Humiliation, Afraid, Rape, and Kick questionnaire    Fear of Current or Ex-Partner: No    Emotionally Abused: No    Physically Abused: No    Sexually Abused: No    Family History: Family  History  Problem Relation Age of Onset   Hypertension Mother    Diabetes Father    Hypertension Father    Diabetes Sister    Breast cancer Sister        stage 0   Hypertension Sister    Stroke Sister    Diabetes Maternal Aunt    Thyroid cancer Daughter 60   Hypertension Other    Colon cancer Neg Hx    Esophageal cancer Neg Hx    Stomach cancer Neg Hx    Rectal cancer Neg Hx    Endometrial cancer Neg Hx    Ovarian cancer Neg Hx     Current Medications:  Current Outpatient Medications:    acetaminophen (TYLENOL) 500 MG tablet, Take 1 tablet (500 mg total) by mouth every 6 (six) hours as needed., Disp: 30 tablet, Rfl: 0   amLODipine (NORVASC) 5 MG tablet, TAKE 1 TABLET (5 MG TOTAL) BY MOUTH DAILY., Disp: 90 tablet, Rfl: 3   Bismuth Subsalicylate (PEPTO-BISMOL MAX STRENGTH PO), Take 10 mLs by mouth every 8 (eight) hours as needed (IBS symptoms)., Disp: , Rfl:    Blood Glucose Monitoring Suppl (ONE TOUCH ULTRA 2) w/Device KIT, Use as directed, Disp: 1 each, Rfl: 0   Cholecalciferol (VITAMIN D3) 50 MCG (2000 UT) TABS, Take 2,000 Units by mouth daily., Disp: , Rfl:    docusate sodium (COLACE) 100 MG capsule, Take 2 capsules (200 mg total) by mouth 2 (two) times daily., Disp: 100 capsule, Rfl: 3   eszopiclone (LUNESTA) 2 MG TABS tablet, Take 1  tablet (2 mg total) by mouth at bedtime as needed for sleep. Take immediately before bedtime, Disp: 90 tablet, Rfl: 1   Famotidine (PEPCID AC PO), Take 1 tablet by mouth daily as needed (indigestion)., Disp: , Rfl:    furosemide (LASIX) 20 MG tablet, Take 1 tablet (20 mg total) by mouth daily as needed for edema (leg swelling)., Disp: 30 tablet, Rfl: 3   glimepiride (AMARYL) 2 MG tablet, Take 0.5 tablets (1 mg total) by mouth daily before breakfast. 1/2 of 2 mg tab, Disp: 90 tablet, Rfl: 3   glucose blood (ONETOUCH ULTRA) test strip, USE TO CHECK BLOOD SUGARS TWO TIMES DAILY, Disp: 100 strip, Rfl: 3   hyoscyamine (ANASPAZ) 0.125 MG TBDP  disintergrating tablet, Place 1 tablet (0.125 mg total) under the tongue every 4 (four) hours as needed for up to 5 days for bladder spasms., Disp: 30 tablet, Rfl: 0   lactulose (CHRONULAC) 10 GM/15ML solution, Take 30 mLs (20 g total) by mouth at bedtime., Disp: 473 mL, Rfl: 2   Lancets (ONETOUCH ULTRASOFT) lancets, 1 each by Other route as needed for other. Use as instructed, Disp: 100 each, Rfl: 3   lidocaine (LIDODERM) 5 %, Place 1 patch onto the skin daily. Remove & Discard patch within 12 hours or as directed by MD, Disp: 30 patch, Rfl: 2   loratadine (CLARITIN) 10 MG tablet, Take 10 mg by mouth daily., Disp: , Rfl:    magnesium oxide (MAG-OX) 400 (240 Mg) MG tablet, Take 1 tablet (400 mg total) by mouth daily., Disp: 30 tablet, Rfl: 3   mirtazapine (REMERON) 15 MG tablet, Take 1 tablet (15 mg total) by mouth at bedtime., Disp: 90 tablet, Rfl: 3   polyethylene glycol (MIRALAX / GLYCOLAX) 17 g packet, Take 17 g by mouth daily., Disp: , Rfl:    prochlorperazine (COMPAZINE) 10 MG tablet, Take 1 tablet (10 mg total) by mouth every 6 (six) hours as needed for nausea or vomiting., Disp: 30 tablet, Rfl: 2   solifenacin (VESICARE) 5 MG tablet, Take 1 tablet (5 mg total) by mouth daily., Disp: 90 tablet, Rfl: 3   vitamin B-12 (CYANOCOBALAMIN) 1000 MCG tablet, Take 1,000 mcg by mouth daily., Disp: , Rfl:    Wheat Dextrin (BENEFIBER) CHEW, Chew 3 tablets by mouth daily., Disp: , Rfl:  No current facility-administered medications for this visit.  Facility-Administered Medications Ordered in Other Visits:    sodium chloride flush (NS) 0.9 % injection 10 mL, 10 mL, Intracatheter, PRN, Doreatha Massed, MD, 10 mL at 07/21/22 1545   Allergies: Allergies  Allergen Reactions   Ciprofloxacin Swelling    Torn tendon   Hydrocodone Bit-Homatrop Mbr Other (See Comments)    Vertigo *pt strongly prefers to never take* took 3 years to recover from   Prednisone Anxiety    *pt strongly prefers to never be  given prednisone*    Sulfa Antibiotics Hives, Itching and Swelling    Tongue swells   Alfuzosin Other (See Comments)    Weakness and fatigue   Codeine Itching   Crestor [Rosuvastatin Calcium] Other (See Comments)    Did something to memory     Doxycycline Other (See Comments)    Severe rectal Gas.   Gabapentin     disoriented   Keflex [Cephalexin] Diarrhea and Nausea And Vomiting   Myrbetriq [facility]os[facility]] Swelling    Swelling of legs and feet   Naproxen Other (See Comments)    Stomach cramps/loud gas   Statins  Muscle weakness    REVIEW OF SYSTEMS:   Review of Systems  Constitutional:  Positive for fatigue. Negative for chills.  HENT:   Negative for lump/mass, mouth sores, nosebleeds, sore throat and trouble swallowing.   Eyes:  Negative for eye problems.  Respiratory:  Positive for shortness of breath. Negative for cough.   Cardiovascular:  Positive for leg swelling. Negative for chest pain and palpitations.  Gastrointestinal:  Positive for constipation, diarrhea and nausea. Negative for abdominal pain and vomiting.  Genitourinary:  Negative for bladder incontinence, difficulty urinating, dysuria, frequency, hematuria and nocturia.   Musculoskeletal:  Positive for arthralgias (lower abdomen, 6-7/10 severity) and back pain. Negative for flank pain, myalgias and neck pain.  Skin:  Negative for itching and rash.  Neurological:  Positive for numbness (tingling feet). Negative for dizziness and headaches.  Hematological:  Does not bruise/bleed easily.  Psychiatric/Behavioral:  Negative for depression, sleep disturbance and suicidal ideas. The patient is not nervous/anxious.   All other systems reviewed and are negative.    VITALS:   Weight 191 lb (86.6 kg).  Wt Readings from Last 3 Encounters:  07/21/22 191 lb (86.6 kg)  07/01/22 193 lb 12.6 oz (87.9 kg)  06/30/22 195 lb (88.5 kg)    Body mass index is 33.83 kg/m.  Performance status (ECOG): 1 - Symptomatic but  completely ambulatory  PHYSICAL EXAM:   Physical Exam Vitals and nursing note reviewed. Exam conducted with a chaperone present.  Constitutional:      Appearance: Normal appearance.  Cardiovascular:     Rate and Rhythm: Normal rate and regular rhythm.     Pulses: Normal pulses.     Heart sounds: Normal heart sounds.  Pulmonary:     Effort: Pulmonary effort is normal.     Breath sounds: Normal breath sounds.  Abdominal:     Palpations: Abdomen is soft. There is no hepatomegaly, splenomegaly or mass.     Tenderness: There is no abdominal tenderness.  Musculoskeletal:     Right lower leg: No edema.     Left lower leg: No edema.  Lymphadenopathy:     Cervical: No cervical adenopathy.     Right cervical: No superficial, deep or posterior cervical adenopathy.    Left cervical: No superficial, deep or posterior cervical adenopathy.     Upper Body:     Right upper body: No supraclavicular or axillary adenopathy.     Left upper body: No supraclavicular or axillary adenopathy.  Neurological:     General: No focal deficit present.     Mental Status: She is alert and oriented to person, place, and time.  Psychiatric:        Mood and Affect: Mood normal.        Behavior: Behavior normal.     LABS:      Latest Ref Rng & Units 07/21/2022    8:17 AM 07/01/2022   10:18 AM 06/30/2022    9:28 PM  CBC  WBC 4.0 - 10.5 K/uL 7.0  11.6  12.6   Hemoglobin 12.0 - 15.0 g/dL 16.1  09.6  04.5   Hematocrit 36.0 - 46.0 % 34.3  31.7  32.4   Platelets 150 - 400 K/uL 196  117  132       Latest Ref Rng & Units 07/21/2022    8:17 AM 07/01/2022   10:18 AM 06/30/2022    9:28 PM  CMP  Glucose 70 - 99 mg/dL 409  811  914   BUN 8 -  23 mg/dL 26  21  24    Creatinine 0.44 - 1.00 mg/dL 1.61  0.96  0.45   Sodium 135 - 145 mmol/L 134  132  134   Potassium 3.5 - 5.1 mmol/L 3.9  3.5  4.1   Chloride 98 - 111 mmol/L 102  103  102   CO2 22 - 32 mmol/L 22  20  22    Calcium 8.9 - 10.3 mg/dL 9.3  8.9  9.5   Total  Protein 6.5 - 8.1 g/dL 7.0  6.5  7.0   Total Bilirubin 0.3 - 1.2 mg/dL 0.4  0.4  0.3   Alkaline Phos 38 - 126 U/L 69  83  74   AST 15 - 41 U/L 21  22  19    ALT 0 - 44 U/L 11  15  13       No results found for: "CEA1", "CEA" / No results found for: "CEA1", "CEA" No results found for: "PSA1" No results found for: "WUJ811" Lab Results  Component Value Date   CAN125 7.8 06/24/2022    No results found for: "TOTALPROTELP", "ALBUMINELP", "A1GS", "A2GS", "BETS", "BETA2SER", "GAMS", "MSPIKE", "SPEI" Lab Results  Component Value Date   TIBC 353 11/12/2021   FERRITIN 48 11/12/2021   IRONPCTSAT 12 11/12/2021   IRONPCTSAT 17.9 (L) 08/25/2018   Lab Results  Component Value Date   LDH 148 04/05/2019     STUDIES:   CT ABDOMEN PELVIS W CONTRAST  Result Date: 06/30/2022 CLINICAL DATA:  Abdominal pain with metastatic ovarian cancer. EXAM: CT ABDOMEN AND PELVIS WITH CONTRAST TECHNIQUE: Multidetector CT imaging of the abdomen and pelvis was performed using the standard protocol following bolus administration of intravenous contrast. RADIATION DOSE REDUCTION: This exam was performed according to the departmental dose-optimization program which includes automated exposure control, adjustment of the mA and/or kV according to patient size and/or use of iterative reconstruction technique. CONTRAST:  75mL OMNIPAQUE IOHEXOL 300 MG/ML  SOLN COMPARISON:  CT without contrast 06/18/2022, CT with contrast 06/14/2022. FINDINGS: Lower chest: Moderate-sized hiatal hernia. The cardiac size is normal. There is three-vessel coronary artery calcification. Lung bases are clear. Hepatobiliary: The liver is mildly steatotic. Periligamentous fat deposition is again noted anteriorly in the left lobe. In segment 4B, there is a periligamentous ill-defined hypodensity posteriorly measuring 3 x 2.1 x 2.6 cm (2:20; 5:66). This was not seen prior to the 06/14/2022 scan with multiple studies for comparison from this year and the  previous 3 years. Although this could be focal fat or aberrant venous drainage, metastasis must be considered. MRI without and with contrast recommended. The remaining liver enhanced homogeneously. The gallbladder is absent without biliary dilatation. Pancreas: Partially atrophic and otherwise unremarkable. Spleen: No abnormality. Adrenals/Urinary Tract: Right ureteral stent positioning is similar. No adrenal masses seen. Bilateral renal cortical thinning is again noted. Mild bilateral hydronephrosis appear similar. No ureteral dilatation is seen, no urinary stones. No bladder thickening is seen. Low pelvic ureteral insertions are noted consistent with pelvic floor laxity and there is a small cystocele. Stomach/Bowel: No dilatation or wall thickening. The appendix is not seen in this patient. Moderate fecal stasis. Vascular/Lymphatic: Aortic atherosclerosis without AAA. Mild branch vessel atherosclerosis. Increased prominence of retrocrural lymph nodes are seen measuring up to 1.3 cm in short axis on 2:17, previously 9 mm. Conglomerate retroperitoneal adenopathy is again seen with aortocaval encasement, on axial image 41 again measuring 6.6 x 4 cm. A portion of this encases the proximal right ureter, measuring 3.9 x 3.2 cm on  2:32, previously 3.8 by 2.8 cm. A centrally necrotic mass inseparable from the right psoas muscle anterior aspect at the level of the mid right kidney is again noted, today measuring 2.5 x 2.7 cm on 2:29, was previously 2.4 x 2.5 cm. There is a necrotic left periaortic chain node unchanged in size at 1.2 cm in short axis on 2:32. No pelvic adenopathy is seen.  No inguinal chain adenopathy. Reproductive: Status post hysterectomy. No adnexal masses. Other: There is trace presacral ascites. No other free fluid. No free air. No incarcerated hernia. Nonspecific subcutaneous edema is seen in the left thigh, present on the 06/18/2022 exam but not on 06/14/2022. Musculoskeletal: There are degenerative  changes of the thoracic and lumbar spine, including advanced L4-5 facet hypertrophy with grade 1 spondylolisthesis and severe acquired spinal stenosis. No destructive bone lesions are seen. IMPRESSION: 1. 3 x 2.1 x 2.6 cm ill-defined hypodensity in segment 4B of the liver, not seen prior to the 06/14/2022 scan. Although this could be focal fat or aberrant venous drainage, metastasis must be considered. MRI without and with contrast recommended. 2. Increased prominence of retrocrural lymph nodes and anterior right psoas mass. 3. No significant change in the conglomerate retroperitoneal adenopathy with encasement of the proximal right ureter, unchanged mild bilateral hydronephrosis with right ureteral stent in stable positioning. 4. Constipation. 5. Aortic and coronary artery atherosclerosis. 6. Moderate-sized hiatal hernia. 7. Nonspecific subcutaneous edema in the left thigh, present on 06/18/2022 but not on 06/14/2022. 8. Degenerative changes of the thoracic and lumbar spine with severe acquired spinal stenosis at L4-5. Aortic Atherosclerosis (ICD10-I70.0). Electronically Signed   By: Almira Bar M.D.   On: 06/30/2022 22:42

## 2022-07-22 ENCOUNTER — Other Ambulatory Visit: Payer: Self-pay | Admitting: *Deleted

## 2022-07-22 MED ORDER — MORPHINE SULFATE ER 15 MG PO TBCR: 15.0000 mg | EXTENDED_RELEASE_TABLET | Freq: Two times a day (BID) | ORAL | 0 refills | Status: DC

## 2022-07-23 ENCOUNTER — Inpatient Hospital Stay: Payer: 59

## 2022-07-29 ENCOUNTER — Other Ambulatory Visit: Payer: Self-pay | Admitting: *Deleted

## 2022-07-29 ENCOUNTER — Ambulatory Visit: Payer: Self-pay | Admitting: Licensed Clinical Social Worker

## 2022-07-29 MED ORDER — AMLODIPINE BESYLATE 10 MG PO TABS: 10.0000 mg | ORAL_TABLET | Freq: Every day | ORAL | 3 refills | Status: AC

## 2022-07-29 NOTE — Patient Instructions (Signed)
Social Work Visit Information  Thank you for taking time to visit with me today. Please don't hesitate to contact me if I can be of assistance to you.   Following are the goals we discussed today:   Goals Addressed             This Visit's Progress    Care Coordination Activiites Reduce symptoms of anxiety related to health concerns       Activities and task to complete in order to accomplish goals.   You have decided not to move forward with counseling and would like to work with me on brief coping skills to assist you with managing symptoms of anxiety Start / continue relaxed breathing 3 times daily Keep all upcoming appointment discussed today Continue with compliance of taking medication prescribed by Doctor Make sure you have the name and phone number of your [facility] Navigator  Complete Advance Directive packet,  Have advance directive notarized and provide a copy to provider office   Continue going to your support group bi weekly  Discussed her healing journey with cancer treatment         Our next appointment is by telephone on 08/18/22 at 10:15   Please call the care guide team at [phone] if you need to cancel or reschedule your appointment.   If you or anyone you know are experiencing a Mental Health or Behavioral Health Crisis or need someone to talk to, please call the Suicide and Crisis Lifeline: 988 call the [place] National Suicide Prevention Lifeline: 586-847-6677 or TTY: 605-187-6293 TTY 431-871-2738) to talk to a trained counselor call 1-800-273-TALK (toll free, 24 hour hotline) go to [facility] [address] [place] [phone])   Patient verbalizes understanding of instructions and care plan provided today and agrees to view in MyChart. Active MyChart status and patient understanding of how to access instructions and care plan via MyChart confirmed with patient.       [name], LCSW Social Work Care  Coordination  [facility] [employer] [phone]

## 2022-07-29 NOTE — Patient Outreach (Signed)
  Care Coordination  Follow Up Visit Note   07/29/2022 Name: Laura [name] MRN: [id number] DOB: 1945-10-26  EVONDA ENGE is a 77 y.o. year old female who sees Corwin Levins, MD for primary care. I spoke with  [name] by phone today.  What matters to the patients health and wellness today?  Managing her health and cancer treatment  Patient continues to have difficulty with managing her emotions and mental health during treatment  Goals Addressed             This Visit's Progress    Care Coordination Activiites Reduce symptoms of anxiety related to health concerns       Activities and task to complete in order to accomplish goals.   You have decided not to move forward with counseling and would like to work with me on brief coping skills to assist you with managing symptoms of anxiety Start / continue relaxed breathing 3 times daily Keep all upcoming appointment discussed today Continue with compliance of taking medication prescribed by Doctor Make sure you have the name and phone number of your [facility] Navigator  Complete Advance Directive packet,  Have advance directive notarized and provide a copy to provider office   Continue going to your support group bi weekly  Discussed her healing journey with cancer treatment        SDOH assessments and interventions completed:  No  Care Coordination Interventions:  Yes, provided  Interventions Today    Flowsheet Row Most Recent Value  Chronic Disease   Chronic disease during today's visit Hypertension (HTN), Diabetes, Chronic Kidney Disease/End Stage Renal Disease (ESRD)  General Interventions   General Interventions Discussed/Reviewed General Interventions Reviewed  [reviewed upcoming appointments]  Mental Health Interventions   Mental Health Discussed/Reviewed Mental Health Reviewed, Anxiety, Depression, Coping Strategies  [solution focused,  active listening,  problem solving and problem solving]       Follow up plan:  Follow up call scheduled for 08/18/22    Encounter Outcome:  Pt. Visit Completed   [name], LCSW Social Work Care Coordination  [facility] [employer] [employer] [phone]

## 2022-08-06 ENCOUNTER — Other Ambulatory Visit: Payer: Self-pay

## 2022-08-07 ENCOUNTER — Encounter: Payer: Self-pay | Admitting: Hematology

## 2022-08-07 ENCOUNTER — Encounter (HOSPITAL_COMMUNITY): Payer: Self-pay | Admitting: Hematology

## 2022-08-10 NOTE — Progress Notes (Signed)
Laura [name] [address] [place] [address]    Clinic Day:  08/10/2022  Referring physician: Corwin Levins, MD  Patient Care Team: Corwin Levins, MD as PCP - General (Internal Medicine) Parke Poisson, MD as PCP - Cardiology (Cardiology) Rachael Fee, MD as Attending Physician (Gastroenterology) Christia Reading, MD as Attending Physician (Otolaryngology) [place], Peter M, MD (Cardiology) Kerrin Champagne, MD (Inactive) as Consulting Physician (Orthopedic Surgery) Doreatha Massed, MD as Medical Oncologist (Oncology) Mickie Bail, RN as Oncology Nurse Navigator (Oncology) Soundra Pilon, LCSW as Social Worker (Licensed Clinical Social Worker) Colletta Maryland, RN as [facility] Care Management   ASSESSMENT & PLAN:   Assessment: 1.  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  Mass measures 6 x 5.8 cm and is partially calcified.  Separate solid component superior to this in the left periaortic region measures 3.5 cm, SUV 14.3.  Cystic component medial to the lower pole of the left kidney is without hypermetabolic activity, possibly a lymphocele. -CT-guided biopsy of the right retroperitoneal lymph node consistent with adenocarcinoma with psammoma bodies.  Morphology and immunotherapy consistent with ovarian serous carcinoma/primary peritoneal carcinoma. -Germline mutation testing was negative. -Foundation 1 testing shows MS-stable.  Loss of heterozygosity was less than 16%. -CA-125 346 on 06/23/2019. - 6 cycles of carboplatin and paclitaxel from 07/28/2019 through 11/17/2019 -CTAP on 10/04/2019 after 3 cycles of chemotherapy showed retroperitoneal adenopathy at the bifurcation measuring 5.2 x 2.9 cm, left para-aortic lymphadenopathy measuring 5 x 4.4 cm, both of them decreased in size when compared to most recent PET  scan.  CT scan report says that one of the lesion has gotten bigger but this was compared to CT scan from 04/02/2019. -PET scan on December 05, 2019 shows 4.8 x 4.2 cm fluid filled density in the left para-aortic region, non-FDG avid favoring benign retroperitoneal cyst versus lymphangioma.  Right/anterior para-aortic mixed cystic/solid lesions measuring 2.4 x 4.8 cm, SUV 2.5, previous SUV 19.5. -Robotic assisted laparoscopic total hysterectomy and bilateral salpingo-oophorectomy by Dr. Andrey Farmer on 01/03/2020. -Pathology showed soft tissue deformities 2 sites and psammomatous calcifications and chronic inflammation with no malignancy identified in the para-aortic lymph node.  Right salpingo-oophorectomy showed microscopic focus of residual adenocarcinoma less than 1 mm.  No malignancy in the left ovary.  [id number]. -As she had prior difficulty with chemotherapy, no further chemotherapy after surgery was recommended. - XRT to retroperitoneal nodal mass (IMRT) from 01/21/2021 through 02/01/2021.  10 fractions, 40 Gray. - Right ureteral stent placement on 03/13/2021 due to right hydronephrosis from retroperitoneal nodal mass. - CTAP (04/01/2021): Centrally necrotic lymph node has increased in size. - We discussed CT findings and reviewed images.  Last tumor marker was normal. - She complained of right lower quadrant pain radiating to the back which started on 05/11/2021 and ended on 05/13/2021 night. - 6 cycles of dose reduced carboplatin and paclitaxel from 05/22/2021 through 09/04/2021. - Germline mutation testing negative - Carboplatin and paclitaxel restarted on 06/24/2022 for recurrence, bevacizumab added with cycle 2 on 07/21/2022.  Plan: 1.  Recurrent ovarian serous carcinoma: - CT CAP on 06/14/2022 showed interval increase in size and number of retroperitoneal lymph nodes consistent with progression. - She tolerated cycle 1 on 06/24/2022 reasonably well.  She felt weak. - Labs: Normal LFTs.  Creatinine 1.20.  CBC  grossly normal.  CA125 is 7.8. - Reviewed  CTAP from 06/30/2022 from ER visit.  Questionable 3 x 2.1 x 2.6 cm ill-defined hypodensity in segment 4 of the liver not seen on 06/14/2022, most likely aberrant venous drainage. - She will proceed with cycle 2 today with same doses.  Will add bevacizumab today at 15 mg/kg dose.  Discussed side effects including hypotension, bleeding risk and proteinuria. - I have offered Ritalin for chemotherapy-induced fatigue at a lower dose.  She is reluctant to consider it as it may cause worsening anxiety. - I will discontinue G-CSF today.  RTC 3 weeks for follow-up.    2.  Hypertension: - She is taking amlodipine 5 mg daily.  Blood pressure is 147/97.  As we are starting bevacizumab, recommend increasing amlodipine to 10 mg daily.  3.  Normocytic anemia: - Normocytic anemia from CKD, functional iron deficiency and myelosuppression.  Hemoglobin today is 11.1.   4.  Back and right groin pain: - She reports pain in the right groin to right loin 1 week ago. - She is taking Dilaudid 4 mg 1 to 2 tablets daily. - Lidocaine patch did not help. - Continue Dilaudid 4 mg, may increase to every 6 hours as needed.  Use lactulose for constipation as needed.  No orders of the defined types were placed in this encounter.     Alben Deeds Teague,acting as a Neurosurgeon for Doreatha Massed, MD.,have documented all relevant documentation on the behalf of Doreatha Massed, MD,as directed by  Doreatha Massed, MD while in the presence of Doreatha Massed, MD.  ***   [place] R Teague   7/28/202410:10 PM  CHIEF COMPLAINT:   Diagnosis: Recurrent serous ovarian cancer.   Cancer Staging  No matching staging information was found for the patient.    Prior Therapy: Chemotherapy  Current Therapy: Carboplatin and paclitaxel and bevacizumab   HISTORY OF PRESENT ILLNESS:   Oncology History  Malignant neoplasm of both ovaries  06/20/2019 Initial Diagnosis   Primary  ovarian adenocarcinoma, unspecified laterality (HCC)   07/01/2019 Genetic Testing   Foundation One     07/11/2019 Genetic Testing   Negative genetic testing:  No pathogenic variants detected on the [facility] Multi-Cancer Panel. The report date is 07/11/2019.  The Multi-Cancer Panel offered by [facility] includes sequencing and/or deletion duplication testing of the following 85 genes: AIP, ALK, APC, ATM, AXIN2,BAP1,  BARD1, BLM, BMPR1A, BRCA1, BRCA2, BRIP1, CASR, CDC73, CDH1, CDK4, CDKN1B, CDKN1C, CDKN2A (p14ARF), CDKN2A (p16INK4a), CEBPA, CHEK2, CTNNA1, DICER1, DIS3L2, EGFR (c.2369C>T, p.Thr790Met variant only), EPCAM (Deletion/duplication testing only), FH, FLCN, GATA2, GPC3, GREM1 (Promoter region deletion/duplication testing only), HOXB13 (c.251G>A, p.Gly84Glu), HRAS, KIT, MAX, MEN1, MET, MITF (c.952G>A, p.Glu318Lys variant only), MLH1, MSH2, MSH3, MSH6, MUTYH, NBN, NF1, NF2, NTHL1, PALB2, PDGFRA, PHOX2B, PMS2, POLD1, POLE, POT1, PRKAR1A, PTCH1, PTEN, RAD50, RAD51C, RAD51D, RB1, RECQL4, RET, RNF43, RUNX1, SDHAF2, SDHA (sequence changes only), SDHB, SDHC, SDHD, SMAD4, SMARCA4, SMARCB1, SMARCE1, STK11, SUFU, TERC, TERT, TMEM127, TP53, TSC1, TSC2, VHL, WRN and WT1.   07/28/2019 - 09/04/2021 Chemotherapy   Patient is on Treatment Plan : OVARIAN Carboplatin (AUC 6) / Paclitaxel (175) q21d x 6 cycles     06/24/2022 -  Chemotherapy   Patient is on Treatment Plan : OVARIAN Carboplatin + Paclitaxel + Bevacizumab q21d         INTERVAL HISTORY:   Laura [name] is a 77 y.o. female presenting to clinic today for follow up of malignant neoplasm. She was last seen by me on 07/21/2022.  Her appetite level is at ***%. Her energy level is at ***%.  PAST MEDICAL HISTORY:   Past Medical History: Past Medical History:  Diagnosis Date   Anemia    Anxiety    Arthritis    back of neck, bones spurs on neck   Cancer (HCC)    ovarian cancer   Cataract both eyes    Cervical disc disease    Diabetes mellitus Type 2     Family history of adverse reaction to anesthesia    sister slow to awaken   Family history of thyroid cancer    GERD (gastroesophageal reflux disease)    Heart murmur    History of COVID-19 06/2020   pain in side took paxlovid x 5 days all symptoms resolved   History of radiation therapy    abdominal lymph nodes 01/21/2021-02/01/2021  Dr Antony Blackbird   Hydronephrosis 08/24/2020   Right Kidney (stable per CT)   Hyperlipidemia    Hypertension    IBS (irritable bowel syndrome)    Left shoulder frozen with limited rom    Mucoid cyst of joint    right thumb   Neuropathy 03/12/2021   hands/fingers both hands numb and tingle @ times   Port-A-Cath in place 07/21/2019   Reflux    Sleep apnea 03/12/2021   has not used cpap in 3 years   Vertigo 12/2010   none since treated at [facility]   Wears glasses for reading    Wears partial dentures upper     Surgical History: Past Surgical History:  Procedure Laterality Date   BLADDER SURGERY     x 3 at [facility]   BREAST EXCISIONAL BIOPSY Bilateral    BREAST SURGERY     fibroid cyst removed ? over 10 yrs ago at [facility] per pt on 03-12-2021   CHOLECYSTECTOMY     yrs ago   COLONOSCOPY  07/02/2020   and 09-19-2016   CYSTOSCOPY W/ URETERAL STENT PLACEMENT Right 03/13/2021   Procedure: CYSTOSCOPY WITH RETROGRADE PYELOGRAM/URETERAL STENT PLACEMENT;  Surgeon: Noel Christmas, MD;  Location: [facility];  Service: Urology;  Laterality: Right;  30 MINS   CYSTOSCOPY W/ URETERAL STENT PLACEMENT Right 10/01/2021   Procedure: CYSTOSCOPY WITH RETROGRADE PYELOGRAM/URETERAL STENT REPLACEMENT;  Surgeon: Noel Christmas, MD;  Location: [facility];  Service: Urology;  Laterality: Right;   CYSTOSCOPY W/ URETERAL STENT PLACEMENT Bilateral 03/18/2022   Procedure: CYSTOSCOPY WITH BILATERAL RETROGRADE PYELOGRAM/RIGHT URETERAL STENT EXCHANGE;  Surgeon: Noel Christmas, MD;  Location: [facility];  Service: Urology;  Laterality: Bilateral;  1HR    CYSTOSCOPY WITH RETROGRADE PYELOGRAM, URETEROSCOPY AND STENT PLACEMENT Right 05/06/2022   Procedure: CYSTOSCOPY WITH RIGHT RETROGRADE PYELOGRAM, AND RIGHT URETERAL STENT EXCHANGE;  Surgeon: Noel Christmas, MD;  Location: [facility];  Service: Urology;  Laterality: Right;   fibroids removed     breast (both breasts) age 28 and total of 3 surgeries   history of chemotherapy     6 rounds june 2021   IR IMAGING GUIDED PORT INSERTION  07/26/2019   right   MASS EXCISION Right 06/26/2016   Procedure: EXCISION MUCOID TUMOR RIGHT THUMB, IP RIGHT THUMB;  Surgeon: Cindee Salt, MD;  Location: [facility];  Service: Orthopedics;  Laterality: Right;   ROBOTIC ASSISTED BILATERAL SALPINGO OOPHERECTOMY N/A 01/03/2020   Procedure: XI ROBOTIC ASSISTED BILATERAL SALPINGO OOPHORECTOMY, RADICAL TUMOR DEBULKING;  Surgeon: Adolphus Birchwood, MD;  Location: [facility];  Service: Gynecology;  Laterality: N/A;   ROBOTIC PELVIC AND PARA-AORTIC LYMPH NODE DISSECTION N/A 01/03/2020   Procedure: XI  ROBOTIC PARA-AORTIC LYMPHADENECTOMY;  Surgeon: Adolphus Birchwood, MD;  Location: [facility];  Service: Gynecology;  Laterality: N/A;   VAGINAL HYSTERECTOMY     age late 14's    Social History: Social History   Socioeconomic History   Marital status: Divorced    Spouse name: Not on file   Number of children: 4   Years of education: 16   Highest education level: Bachelor's degree (e.g., BA, AB, BS)  Occupational History   Occupation: retired Photographer  Tobacco Use   Smoking status: Never   Smokeless tobacco: Never  Vaping Use   Vaping status: Never Used  Substance and Sexual Activity   Alcohol use: No    Alcohol/week: 0.0 standard drinks of alcohol   Drug use: No   Sexual activity: Not Currently    Birth control/protection: Surgical  Other Topics Concern   Not on file  Social History Narrative   Not on file   Social Determinants of Health   Financial Resource Strain: Low Risk  (04/01/2022)   Overall  Financial Resource Strain (CARDIA)    Difficulty of Paying Living Expenses: Not hard at all  Food Insecurity: Patient Declined (06/15/2022)   Hunger Vital Sign    Worried About Running Out of Food in the Last Year: Patient declined    Ran Out of Food in the Last Year: Patient declined  Transportation Needs: No Transportation Needs (06/25/2022)   PRAPARE - Administrator, Civil Service (Medical): No    Lack of Transportation (Non-Medical): No  Physical Activity: Unknown (04/01/2022)   Exercise Vital Sign    Days of Exercise per Week: 0 days    Minutes of Exercise per Session: Not on file  Recent Concern: Physical Activity - Inactive (04/01/2022)   Exercise Vital Sign    Days of Exercise per Week: 0 days    Minutes of Exercise per Session: 10 min  Stress: No Stress Concern Present (04/01/2022)   [employer] of Occupational Health - Occupational Stress Questionnaire    Feeling of Stress : Only a little  Recent Concern: Stress - Stress Concern Present (03/21/2022)   [employer] of Occupational Health - Occupational Stress Questionnaire    Feeling of Stress : Very much  Social Connections: Moderately Integrated (04/01/2022)   Social Connection and Isolation Panel [NHANES]    Frequency of Communication with Friends and Family: More than three times a week    Frequency of Social Gatherings with Friends and Family: Three times a week    Attends Religious Services: More than 4 times per year    Active Member of Clubs or Organizations: Yes    Attends Banker Meetings: More than 4 times per year    Marital Status: Divorced  Intimate Partner Violence: Not At Risk (06/15/2022)   Humiliation, Afraid, Rape, and Kick questionnaire    Fear of Current or Ex-Partner: No    Emotionally Abused: No    Physically Abused: No    Sexually Abused: No    Family History: Family History  Problem Relation Age of Onset   Hypertension Mother    Diabetes Father    Hypertension  Father    Diabetes Sister    Breast cancer Sister        stage 0   Hypertension Sister    Stroke Sister    Diabetes Maternal Aunt    Thyroid cancer Daughter 5   Hypertension Other    Colon cancer Neg Hx    Esophageal  cancer Neg Hx    Stomach cancer Neg Hx    Rectal cancer Neg Hx    Endometrial cancer Neg Hx    Ovarian cancer Neg Hx     Current Medications:  Current Outpatient Medications:    acetaminophen (TYLENOL) 500 MG tablet, Take 1 tablet (500 mg total) by mouth every 6 (six) hours as needed., Disp: 30 tablet, Rfl: 0   amLODipine (NORVASC) 10 MG tablet, Take 1 tablet (10 mg total) by mouth daily., Disp: 30 tablet, Rfl: 3   Bismuth Subsalicylate (PEPTO-BISMOL MAX STRENGTH PO), Take 10 mLs by mouth every 8 (eight) hours as needed (IBS symptoms)., Disp: , Rfl:    Blood Glucose Monitoring Suppl (ONE TOUCH ULTRA 2) w/Device KIT, Use as directed, Disp: 1 each, Rfl: 0   Cholecalciferol (VITAMIN D3) 50 MCG (2000 UT) TABS, Take 2,000 Units by mouth daily., Disp: , Rfl:    docusate sodium (COLACE) 100 MG capsule, Take 2 capsules (200 mg total) by mouth 2 (two) times daily., Disp: 100 capsule, Rfl: 3   eszopiclone (LUNESTA) 2 MG TABS tablet, Take 1 tablet (2 mg total) by mouth at bedtime as needed for sleep. Take immediately before bedtime, Disp: 90 tablet, Rfl: 1   Famotidine (PEPCID AC PO), Take 1 tablet by mouth daily as needed (indigestion)., Disp: , Rfl:    furosemide (LASIX) 20 MG tablet, Take 1 tablet (20 mg total) by mouth daily as needed for edema (leg swelling)., Disp: 30 tablet, Rfl: 3   glimepiride (AMARYL) 2 MG tablet, Take 0.5 tablets (1 mg total) by mouth daily before breakfast. 1/2 of 2 mg tab, Disp: 90 tablet, Rfl: 3   glucose blood (ONETOUCH ULTRA) test strip, USE TO CHECK BLOOD SUGARS TWO TIMES DAILY, Disp: 100 strip, Rfl: 3   hyoscyamine (ANASPAZ) 0.125 MG TBDP disintergrating tablet, Place 1 tablet (0.125 mg total) under the tongue every 4 (four) hours as needed for up  to 5 days for bladder spasms., Disp: 30 tablet, Rfl: 0   lactulose (CHRONULAC) 10 GM/15ML solution, Take 30 mLs (20 g total) by mouth at bedtime., Disp: 473 mL, Rfl: 2   Lancets (ONETOUCH ULTRASOFT) lancets, 1 each by Other route as needed for other. Use as instructed, Disp: 100 each, Rfl: 3   lidocaine (LIDODERM) 5 %, Place 1 patch onto the skin daily. Remove & Discard patch within 12 hours or as directed by MD, Disp: 30 patch, Rfl: 2   loratadine (CLARITIN) 10 MG tablet, Take 10 mg by mouth daily., Disp: , Rfl:    magnesium oxide (MAG-OX) 400 (240 Mg) MG tablet, Take 1 tablet (400 mg total) by mouth daily., Disp: 30 tablet, Rfl: 3   mirtazapine (REMERON) 15 MG tablet, Take 1 tablet (15 mg total) by mouth at bedtime., Disp: 90 tablet, Rfl: 3   morphine (MS CONTIN) 15 MG 12 hr tablet, Take 1 tablet (15 mg total) by mouth every 12 (twelve) hours., Disp: 60 tablet, Rfl: 0   polyethylene glycol (MIRALAX / GLYCOLAX) 17 g packet, Take 17 g by mouth daily., Disp: , Rfl:    prochlorperazine (COMPAZINE) 10 MG tablet, Take 1 tablet (10 mg total) by mouth every 6 (six) hours as needed for nausea or vomiting., Disp: 30 tablet, Rfl: 2   solifenacin (VESICARE) 5 MG tablet, Take 1 tablet (5 mg total) by mouth daily., Disp: 90 tablet, Rfl: 3   vitamin B-12 (CYANOCOBALAMIN) 1000 MCG tablet, Take 1,000 mcg by mouth daily., Disp: , Rfl:    Wheat  Dextrin (BENEFIBER) CHEW, Chew 3 tablets by mouth daily., Disp: , Rfl:    Allergies: Allergies  Allergen Reactions   Ciprofloxacin Swelling    Torn tendon   Hydrocodone Bit-Homatrop Mbr Other (See Comments)    Vertigo *pt strongly prefers to never take* took 3 years to recover from   Prednisone Anxiety    *pt strongly prefers to never be given prednisone*    Sulfa Antibiotics Hives, Itching and Swelling    Tongue swells   Alfuzosin Other (See Comments)    Weakness and fatigue   Codeine Itching   Crestor [Rosuvastatin Calcium] Other (See Comments)    Did something  to memory     Doxycycline Other (See Comments)    Severe rectal Gas.   Gabapentin     disoriented   Keflex [Cephalexin] Diarrhea and Nausea And Vomiting   Myrbetriq [facility]] Swelling    Swelling of legs and feet   Naproxen Other (See Comments)    Stomach cramps/loud gas   Statins     Muscle weakness    REVIEW OF SYSTEMS:   Review of Systems  Constitutional:  Negative for chills, fatigue and fever.  HENT:   Negative for lump/mass, mouth sores, nosebleeds, sore throat and trouble swallowing.   Eyes:  Negative for eye problems.  Respiratory:  Negative for cough and shortness of breath.   Cardiovascular:  Negative for chest pain, leg swelling and palpitations.  Gastrointestinal:  Negative for abdominal pain, constipation, diarrhea, nausea and vomiting.  Genitourinary:  Negative for bladder incontinence, difficulty urinating, dysuria, frequency, hematuria and nocturia.   Musculoskeletal:  Negative for arthralgias, back pain, flank pain, myalgias and neck pain.  Skin:  Negative for itching and rash.  Neurological:  Negative for dizziness, headaches and numbness.  Hematological:  Does not bruise/bleed easily.  Psychiatric/Behavioral:  Negative for depression, sleep disturbance and suicidal ideas. The patient is not nervous/anxious.   All other systems reviewed and are negative.    VITALS:   There were no vitals taken for this visit.  Wt Readings from Last 3 Encounters:  07/21/22 191 lb (86.6 kg)  07/01/22 193 lb 12.6 oz (87.9 kg)  06/30/22 195 lb (88.5 kg)    There is no height or weight on file to calculate BMI.  Performance status (ECOG): 1 - Symptomatic but completely ambulatory  PHYSICAL EXAM:   Physical Exam Vitals and nursing note reviewed. Exam conducted with a chaperone present.  Constitutional:      Appearance: Normal appearance.  Cardiovascular:     Rate and Rhythm: Normal rate and regular rhythm.     Pulses: Normal pulses.     Heart sounds: Normal  heart sounds.  Pulmonary:     Effort: Pulmonary effort is normal.     Breath sounds: Normal breath sounds.  Abdominal:     Palpations: Abdomen is soft. There is no hepatomegaly, splenomegaly or mass.     Tenderness: There is no abdominal tenderness.  Musculoskeletal:     Right lower leg: No edema.     Left lower leg: No edema.  Lymphadenopathy:     Cervical: No cervical adenopathy.     Right cervical: No superficial, deep or posterior cervical adenopathy.    Left cervical: No superficial, deep or posterior cervical adenopathy.     Upper Body:     Right upper body: No supraclavicular or axillary adenopathy.     Left upper body: No supraclavicular or axillary adenopathy.  Neurological:     General: No focal  deficit present.     Mental Status: She is alert and oriented to person, place, and time.  Psychiatric:        Mood and Affect: Mood normal.        Behavior: Behavior normal.     LABS:      Latest Ref Rng & Units 07/21/2022    8:17 AM 07/01/2022   10:18 AM 06/30/2022    9:28 PM  CBC  WBC 4.0 - 10.5 K/uL 7.0  11.6  12.6   Hemoglobin 12.0 - 15.0 g/dL 78.4  69.6  29.5   Hematocrit 36.0 - 46.0 % 34.3  31.7  32.4   Platelets 150 - 400 K/uL 196  117  132       Latest Ref Rng & Units 07/21/2022    8:17 AM 07/01/2022   10:18 AM 06/30/2022    9:28 PM  CMP  Glucose 70 - 99 mg/dL 284  132  440   BUN 8 - 23 mg/dL 26  21  24    Creatinine 0.44 - 1.00 mg/dL 1.02  7.25  3.66   Sodium 135 - 145 mmol/L 134  132  134   Potassium 3.5 - 5.1 mmol/L 3.9  3.5  4.1   Chloride 98 - 111 mmol/L 102  103  102   CO2 22 - 32 mmol/L 22  20  22    Calcium 8.9 - 10.3 mg/dL 9.3  8.9  9.5   Total Protein 6.5 - 8.1 g/dL 7.0  6.5  7.0   Total Bilirubin 0.3 - 1.2 mg/dL 0.4  0.4  0.3   Alkaline Phos 38 - 126 U/L 69  83  74   AST 15 - 41 U/L 21  22  19    ALT 0 - 44 U/L 11  15  13       No results found for: "CEA1", "CEA" / No results found for: "CEA1", "CEA" No results found for: "PSA1" No results found  for: "CAN199" Lab Results  Component Value Date   CAN125 7.8 06/24/2022    No results found for: "TOTALPROTELP", "ALBUMINELP", "A1GS", "A2GS", "BETS", "BETA2SER", "GAMS", "MSPIKE", "SPEI" Lab Results  Component Value Date   TIBC 353 11/12/2021   FERRITIN 48 11/12/2021   IRONPCTSAT 12 11/12/2021   IRONPCTSAT 17.9 (L) 08/25/2018   Lab Results  Component Value Date   LDH 148 04/05/2019     STUDIES:   No results found.

## 2022-08-11 ENCOUNTER — Inpatient Hospital Stay: Payer: 59

## 2022-08-11 ENCOUNTER — Inpatient Hospital Stay (HOSPITAL_BASED_OUTPATIENT_CLINIC_OR_DEPARTMENT_OTHER): Payer: 59 | Admitting: Hematology

## 2022-08-11 VITALS — BP 150/72 | HR 67 | Temp 98.2°F | Resp 19

## 2022-08-11 VITALS — BP 126/72 | HR 86 | Temp 98.8°F | Resp 20 | Wt 187.0 lb

## 2022-08-11 DIAGNOSIS — D509 Iron deficiency anemia, unspecified: Secondary | ICD-10-CM | POA: Diagnosis not present

## 2022-08-11 DIAGNOSIS — Z95828 Presence of other vascular implants and grafts: Secondary | ICD-10-CM

## 2022-08-11 DIAGNOSIS — C563 Malignant neoplasm of bilateral ovaries: Secondary | ICD-10-CM

## 2022-08-11 DIAGNOSIS — D631 Anemia in chronic kidney disease: Secondary | ICD-10-CM | POA: Diagnosis not present

## 2022-08-11 DIAGNOSIS — I129 Hypertensive chronic kidney disease with stage 1 through stage 4 chronic kidney disease, or unspecified chronic kidney disease: Secondary | ICD-10-CM | POA: Diagnosis not present

## 2022-08-11 DIAGNOSIS — Z5111 Encounter for antineoplastic chemotherapy: Secondary | ICD-10-CM | POA: Diagnosis not present

## 2022-08-11 DIAGNOSIS — N189 Chronic kidney disease, unspecified: Secondary | ICD-10-CM | POA: Diagnosis not present

## 2022-08-11 DIAGNOSIS — C772 Secondary and unspecified malignant neoplasm of intra-abdominal lymph nodes: Secondary | ICD-10-CM | POA: Diagnosis not present

## 2022-08-11 LAB — CBC WITH DIFFERENTIAL/PLATELET
Abs Immature Granulocytes: 0.02 10*3/uL (ref 0.00–0.07)
Basophils Absolute: 0 10*3/uL (ref 0.0–0.1)
Basophils Relative: 0 %
Eosinophils Absolute: 0.1 10*3/uL (ref 0.0–0.5)
Eosinophils Relative: 1 %
HCT: 35.4 % — ABNORMAL LOW (ref 36.0–46.0)
Hemoglobin: 11.4 g/dL — ABNORMAL LOW (ref 12.0–15.0)
Immature Granulocytes: 0 %
Lymphocytes Relative: 23 %
Lymphs Abs: 1.1 10*3/uL (ref 0.7–4.0)
MCH: 29.2 pg (ref 26.0–34.0)
MCHC: 32.2 g/dL (ref 30.0–36.0)
MCV: 90.8 fL (ref 80.0–100.0)
Monocytes Absolute: 0.5 10*3/uL (ref 0.1–1.0)
Monocytes Relative: 10 %
Neutro Abs: 3.1 10*3/uL (ref 1.7–7.7)
Neutrophils Relative %: 66 %
Platelets: 133 10*3/uL — ABNORMAL LOW (ref 150–400)
RBC: 3.9 MIL/uL (ref 3.87–5.11)
RDW: 16.2 % — ABNORMAL HIGH (ref 11.5–15.5)
WBC: 4.8 10*3/uL (ref 4.0–10.5)
nRBC: 0 % (ref 0.0–0.2)

## 2022-08-11 LAB — COMPREHENSIVE METABOLIC PANEL
ALT: 12 U/L (ref 0–44)
AST: 24 U/L (ref 15–41)
Albumin: 3.8 g/dL (ref 3.5–5.0)
Alkaline Phosphatase: 68 U/L (ref 38–126)
Anion gap: 6 (ref 5–15)
BUN: 24 mg/dL — ABNORMAL HIGH (ref 8–23)
CO2: 21 mmol/L — ABNORMAL LOW (ref 22–32)
Calcium: 9.2 mg/dL (ref 8.9–10.3)
Chloride: 106 mmol/L (ref 98–111)
Creatinine, Ser: 1.25 mg/dL — ABNORMAL HIGH (ref 0.44–1.00)
GFR, Estimated: 44 mL/min — ABNORMAL LOW (ref >60)
Glucose, Bld: 163 mg/dL — ABNORMAL HIGH (ref 70–99)
Potassium: 3.8 mmol/L (ref 3.5–5.1)
Sodium: 133 mmol/L — ABNORMAL LOW (ref 135–145)
Total Bilirubin: 0.6 mg/dL (ref 0.3–1.2)
Total Protein: 7.1 g/dL (ref 6.5–8.1)

## 2022-08-11 LAB — URINALYSIS, DIPSTICK ONLY
Bilirubin Urine: NEGATIVE
Glucose, UA: NEGATIVE mg/dL
Hgb urine dipstick: NEGATIVE
Ketones, ur: NEGATIVE mg/dL
Nitrite: NEGATIVE
Protein, ur: 30 mg/dL — AB
Specific Gravity, Urine: 1.015 (ref 1.005–1.030)
pH: 5 (ref 5.0–8.0)

## 2022-08-11 LAB — MAGNESIUM: Magnesium: 2 mg/dL (ref 1.7–2.4)

## 2022-08-11 MED ORDER — ESCITALOPRAM OXALATE 10 MG PO TABS: 10.0000 mg | ORAL_TABLET | Freq: Every day | ORAL | 3 refills | Status: DC

## 2022-08-11 MED ORDER — SODIUM CHLORIDE 0.9 % IV SOLN
150.0000 mg | Freq: Once | INTRAVENOUS | Status: AC
  Administered 2022-08-11: 150 mg via INTRAVENOUS
  Filled 2022-08-11: qty 150

## 2022-08-11 MED ORDER — SODIUM CHLORIDE 0.9 % IV SOLN
381.0000 mg | Freq: Once | INTRAVENOUS | Status: AC
  Administered 2022-08-11: 380 mg via INTRAVENOUS
  Filled 2022-08-11: qty 38

## 2022-08-11 MED ORDER — SODIUM CHLORIDE 0.9% FLUSH
10.0000 mL | INTRAVENOUS | Status: DC | PRN
  Administered 2022-08-11: 10 mL

## 2022-08-11 MED ORDER — PALONOSETRON HCL INJECTION 0.25 MG/5ML
0.2500 mg | Freq: Once | INTRAVENOUS | Status: AC
  Administered 2022-08-11: 0.25 mg via INTRAVENOUS
  Filled 2022-08-11: qty 5

## 2022-08-11 MED ORDER — HEPARIN SOD (PORK) LOCK FLUSH 100 UNIT/ML IV SOLN
500.0000 [IU] | Freq: Once | INTRAVENOUS | Status: AC | PRN
  Administered 2022-08-11: 500 [IU]

## 2022-08-11 MED ORDER — SODIUM CHLORIDE 0.9 % IV SOLN
140.0000 mg/m2 | Freq: Once | INTRAVENOUS | Status: AC
  Administered 2022-08-11: 276 mg via INTRAVENOUS
  Filled 2022-08-11: qty 46

## 2022-08-11 MED ORDER — FAMOTIDINE IN NACL 20-0.9 MG/50ML-% IV SOLN
20.0000 mg | Freq: Once | INTRAVENOUS | Status: AC
  Administered 2022-08-11: 20 mg via INTRAVENOUS
  Filled 2022-08-11: qty 50

## 2022-08-11 MED ORDER — CETIRIZINE HCL 10 MG/ML IV SOLN
10.0000 mg | Freq: Once | INTRAVENOUS | Status: AC
  Administered 2022-08-11: 10 mg via INTRAVENOUS
  Filled 2022-08-11: qty 1

## 2022-08-11 MED ORDER — SODIUM CHLORIDE 0.9 % IV SOLN
10.0000 mg | Freq: Once | INTRAVENOUS | Status: AC
  Administered 2022-08-11: 10 mg via INTRAVENOUS
  Filled 2022-08-11: qty 10

## 2022-08-11 MED ORDER — SODIUM CHLORIDE 0.9 % IV SOLN
15.0000 mg/kg | Freq: Once | INTRAVENOUS | Status: AC
  Administered 2022-08-11: 1300 mg via INTRAVENOUS
  Filled 2022-08-11: qty 48

## 2022-08-11 MED ORDER — SODIUM CHLORIDE 0.9 % IV SOLN: Freq: Once | INTRAVENOUS | Status: AC

## 2022-08-11 NOTE — Progress Notes (Signed)
Patient presents today for chemotherapy infusion. Patient is in satisfactory condition with no new complaints voiced.  Vital signs are stable.  Labs reviewed by Dr. Ellin Saba during the office visit and all labs are within treatment parameters.  Urine protein is 30 today.  We will proceed with treatment per MD orders.   Patient tolerated treatment well with no complaints voiced.  Patient left via wheelchair in stable condition.  Vital signs stable at discharge.  Follow up as scheduled.

## 2022-08-11 NOTE — Progress Notes (Signed)
Patient has been examined by Dr. Katragadda. Vital signs and labs have been reviewed by MD - ANC, Creatinine, LFTs, hemoglobin, and platelets are within treatment parameters per M.D. - pt may proceed with treatment.  Primary RN and pharmacy notified.  

## 2022-08-11 NOTE — Patient Instructions (Signed)
[facility]  Discharge Instructions: Thank you for choosing [facility] to provide your oncology and hematology care.  If you have a lab appointment with the Cancer Center - please note that after April 8th, 2024, all labs will be drawn in the cancer center.  You do not have to check in or register with the main entrance as you have in the past but will complete your check-in in the cancer center.  Wear comfortable clothing and clothing appropriate for easy access to any Portacath or PICC line.   We strive to give you quality time with your provider. You may need to reschedule your appointment if you arrive late (15 or more minutes).  Arriving late affects you and other patients whose appointments are after yours.  Also, if you miss three or more appointments without notifying the office, you may be dismissed from the clinic at the provider's discretion.      For prescription refill requests, have your pharmacy contact our office and allow 72 hours for refills to be completed.    Today you received the following chemotherapy and/or immunotherapy agents MVASI/Taxol/Carboplatin.   Bevacizumab Injection What is this medication? BEVACIZUMAB (be va SIZ yoo mab) treats some types of cancer. It works by blocking a protein that causes cancer cells to grow and multiply. This helps to slow or stop the spread of cancer cells. It is a monoclonal antibody. This medicine may be used for other purposes; ask your health care provider or pharmacist if you have questions. COMMON BRAND NAME(S): Alymsys, Avastin, MVASI, Omer Jack What should I tell my care team before I take this medication? They need to know if you have any of these conditions: Blood clots Coughing up blood Having or recent surgery Heart failure High blood pressure History of a connection between 2 or more body parts that do not usually connect (fistula) History of a tear in your stomach or  intestines Protein in your urine An unusual or allergic reaction to bevacizumab, other medications, foods, dyes, or preservatives Pregnant or trying to get pregnant Breast-feeding How should I use this medication? This medication is injected into a vein. It is given by your care team in a hospital or clinic setting. Talk to your care team the use of this medication in children. Special care may be needed. Overdosage: If you think you have taken too much of this medicine contact a poison control center or emergency room at once. NOTE: This medicine is only for you. Do not share this medicine with others. What if I miss a dose? Keep appointments for follow-up doses. It is important not to miss your dose. Call your care team if you are unable to keep an appointment. What may interact with this medication? Interactions are not expected. This list may not describe all possible interactions. Give your health care provider a list of all the medicines, herbs, non-prescription drugs, or dietary supplements you use. Also tell them if you smoke, drink alcohol, or use illegal drugs. Some items may interact with your medicine. What should I watch for while using this medication? Your condition will be monitored carefully while you are receiving this medication. You may need blood work while taking this medication. This medication may make you feel generally unwell. This is not uncommon as chemotherapy can affect healthy cells as well as cancer cells. Report any side effects. Continue your course of treatment even though you feel ill unless your care team tells you to stop.  This medication may increase your risk to bruise or bleed. Call your care team if you notice any unusual bleeding. Before having surgery, talk to your care team to make sure it is ok. This medication can increase the risk of poor healing of your surgical site or wound. You will need to stop this medication for 28 days before surgery. After  surgery, wait at least 28 days before restarting this medication. Make sure the surgical site or wound is healed enough before restarting this medication. Talk to your care team if questions. Talk to your care team if you may be pregnant. Serious birth defects can occur if you take this medication during pregnancy and for 6 months after the last dose. Contraception is recommended while taking this medication and for 6 months after the last dose. Your care team can help you find the option that works for you. Do not breastfeed while taking this medication and for 6 months after the last dose. This medication can cause infertility. Talk to your care team if you are concerned about your fertility. What side effects may I notice from receiving this medication? Side effects that you should report to your care team as soon as possible: Allergic reactions--skin rash, itching, hives, swelling of the face, lips, tongue, or throat Bleeding--bloody or black, tar-like stools, vomiting blood or brown material that looks like coffee grounds, red or dark brown urine, small red or purple spots on skin, unusual bruising or bleeding Blood clot--pain, swelling, or warmth in the leg, shortness of breath, chest pain Heart attack--pain or tightness in the chest, shoulders, arms, or jaw, nausea, shortness of breath, cold or clammy skin, feeling faint or lightheaded Heart failure--shortness of breath, swelling of the ankles, feet, or hands, sudden weight gain, unusual weakness or fatigue Increase in blood pressure Infection--fever, chills, cough, sore throat, wounds that don't heal, pain or trouble when passing urine, general feeling of discomfort or being unwell Infusion reactions--chest pain, shortness of breath or trouble breathing, feeling faint or lightheaded Kidney injury--decrease in the amount of urine, swelling of the ankles, hands, or feet Stomach pain that is severe, does not go away, or gets worse Stroke--sudden  numbness or weakness of the face, arm, or leg, trouble speaking, confusion, trouble walking, loss of balance or coordination, dizziness, severe headache, change in vision Sudden and severe headache, confusion, change in vision, seizures, which may be signs of posterior reversible encephalopathy syndrome (PRES) Side effects that usually do not require medical attention (report to your care team if they continue or are bothersome): Back pain Change in taste Diarrhea Dry skin Increased tears Nosebleed This list may not describe all possible side effects. Call your doctor for medical advice about side effects. You may report side effects to FDA at 1-800-FDA-1088. Where should I keep my medication? This medication is given in a hospital or clinic. It will not be stored at home. NOTE: This sheet is a summary. It may not cover all possible information. If you have questions about this medicine, talk to your doctor, pharmacist, or health care provider.  2024 Elsevier/Gold Standard (2021-05-17 00:00:00)    Paclitaxel Injection What is this medication? PACLITAXEL (PAK li TAX el) treats some types of cancer. It works by slowing down the growth of cancer cells. This medicine may be used for other purposes; ask your health care provider or pharmacist if you have questions. COMMON BRAND NAME(S): Onxol, Taxol What should I tell my care team before I take this medication? They need to  know if you have any of these conditions: Heart disease Liver disease Low white blood cell levels An unusual or allergic reaction to paclitaxel, other medications, foods, dyes, or preservatives If you or your partner are pregnant or trying to get pregnant Breast-feeding How should I use this medication? This medication is injected into a vein. It is given by your care team in a hospital or clinic setting. Talk to your care team about the use of this medication in children. While it may be given to children for selected  conditions, precautions do apply. Overdosage: If you think you have taken too much of this medicine contact a poison control center or emergency room at once. NOTE: This medicine is only for you. Do not share this medicine with others. What if I miss a dose? Keep appointments for follow-up doses. It is important not to miss your dose. Call your care team if you are unable to keep an appointment. What may interact with this medication? Do not take this medication with any of the following: Live virus vaccines Other medications may affect the way this medication works. Talk with your care team about all of the medications you take. They may suggest changes to your treatment plan to lower the risk of side effects and to make sure your medications work as intended. This list may not describe all possible interactions. Give your health care provider a list of all the medicines, herbs, non-prescription drugs, or dietary supplements you use. Also tell them if you smoke, drink alcohol, or use illegal drugs. Some items may interact with your medicine. What should I watch for while using this medication? Your condition will be monitored carefully while you are receiving this medication. You may need blood work while taking this medication. This medication may make you feel generally unwell. This is not uncommon as chemotherapy can affect healthy cells as well as cancer cells. Report any side effects. Continue your course of treatment even though you feel ill unless your care team tells you to stop. This medication can cause serious allergic reactions. To reduce the risk, your care team may give you other medications to take before receiving this one. Be sure to follow the directions from your care team. This medication may increase your risk of getting an infection. Call your care team for advice if you get a fever, chills, sore throat, or other symptoms of a cold or flu. Do not treat yourself. Try to avoid  being around people who are sick. This medication may increase your risk to bruise or bleed. Call your care team if you notice any unusual bleeding. Be careful brushing or flossing your teeth or using a toothpick because you may get an infection or bleed more easily. If you have any dental work done, tell your dentist you are receiving this medication. Talk to your care team if you may be pregnant. Serious birth defects can occur if you take this medication during pregnancy. Talk to your care team before breastfeeding. Changes to your treatment plan may be needed. What side effects may I notice from receiving this medication? Side effects that you should report to your care team as soon as possible: Allergic reactions--skin rash, itching, hives, swelling of the face, lips, tongue, or throat Heart rhythm changes--fast or irregular heartbeat, dizziness, feeling faint or lightheaded, chest pain, trouble breathing Increase in blood pressure Infection--fever, chills, cough, sore throat, wounds that don't heal, pain or trouble when passing urine, general feeling of discomfort or being unwell  Low blood pressure--dizziness, feeling faint or lightheaded, blurry vision Low red blood cell level--unusual weakness or fatigue, dizziness, headache, trouble breathing Painful swelling, warmth, or redness of the skin, blisters or sores at the infusion site Pain, tingling, or numbness in the hands or feet Slow heartbeat--dizziness, feeling faint or lightheaded, confusion, trouble breathing, unusual weakness or fatigue Unusual bruising or bleeding Side effects that usually do not require medical attention (report to your care team if they continue or are bothersome): Diarrhea Hair loss Joint pain Loss of appetite Muscle pain Nausea Vomiting This list may not describe all possible side effects. Call your doctor for medical advice about side effects. You may report side effects to FDA at 1-800-FDA-1088. Where  should I keep my medication? This medication is given in a hospital or clinic. It will not be stored at home. NOTE: This sheet is a summary. It may not cover all possible information. If you have questions about this medicine, talk to your doctor, pharmacist, or health care provider.  2024 Elsevier/Gold Standard (2021-05-21 00:00:00)    Carboplatin Injection What is this medication? CARBOPLATIN (KAR boe pla tin) treats some types of cancer. It works by slowing down the growth of cancer cells. This medicine may be used for other purposes; ask your health care provider or pharmacist if you have questions. COMMON BRAND NAME(S): Paraplatin What should I tell my care team before I take this medication? They need to know if you have any of these conditions: Blood disorders Hearing problems Kidney disease Recent or ongoing radiation therapy An unusual or allergic reaction to carboplatin, cisplatin, other medications, foods, dyes, or preservatives Pregnant or trying to get pregnant Breast-feeding How should I use this medication? This medication is injected into a vein. It is given by your care team in a hospital or clinic setting. Talk to your care team about the use of this medication in children. Special care may be needed. Overdosage: If you think you have taken too much of this medicine contact a poison control center or emergency room at once. NOTE: This medicine is only for you. Do not share this medicine with others. What if I miss a dose? Keep appointments for follow-up doses. It is important not to miss your dose. Call your care team if you are unable to keep an appointment. What may interact with this medication? Medications for seizures Some antibiotics, such as amikacin, gentamicin, neomycin, streptomycin, tobramycin Vaccines This list may not describe all possible interactions. Give your health care provider a list of all the medicines, herbs, non-prescription drugs, or dietary  supplements you use. Also tell them if you smoke, drink alcohol, or use illegal drugs. Some items may interact with your medicine. What should I watch for while using this medication? Your condition will be monitored carefully while you are receiving this medication. You may need blood work while taking this medication. This medication may make you feel generally unwell. This is not uncommon, as chemotherapy can affect healthy cells as well as cancer cells. Report any side effects. Continue your course of treatment even though you feel ill unless your care team tells you to stop. In some cases, you may be given additional medications to help with side effects. Follow all directions for their use. This medication may increase your risk of getting an infection. Call your care team for advice if you get a fever, chills, sore throat, or other symptoms of a cold or flu. Do not treat yourself. Try to avoid being around people  who are sick. Avoid taking medications that contain aspirin, acetaminophen, ibuprofen, naproxen, or ketoprofen unless instructed by your care team. These medications may hide a fever. Be careful brushing or flossing your teeth or using a toothpick because you may get an infection or bleed more easily. If you have any dental work done, tell your dentist you are receiving this medication. Talk to your care team if you wish to become pregnant or think you might be pregnant. This medication can cause serious birth defects. Talk to your care team about effective forms of contraception. Do not breast-feed while taking this medication. What side effects may I notice from receiving this medication? Side effects that you should report to your care team as soon as possible: Allergic reactions--skin rash, itching, hives, swelling of the face, lips, tongue, or throat Infection--fever, chills, cough, sore throat, wounds that don't heal, pain or trouble when passing urine, general feeling of  discomfort or being unwell Low red blood cell level--unusual weakness or fatigue, dizziness, headache, trouble breathing Pain, tingling, or numbness in the hands or feet, muscle weakness, change in vision, confusion or trouble speaking, loss of balance or coordination, trouble walking, seizures Unusual bruising or bleeding Side effects that usually do not require medical attention (report to your care team if they continue or are bothersome): Hair loss Nausea Unusual weakness or fatigue Vomiting This list may not describe all possible side effects. Call your doctor for medical advice about side effects. You may report side effects to FDA at 1-800-FDA-1088. Where should I keep my medication? This medication is given in a hospital or clinic. It will not be stored at home. NOTE: This sheet is a summary. It may not cover all possible information. If you have questions about this medicine, talk to your doctor, pharmacist, or health care provider.  2024 Elsevier/Gold Standard (2021-04-23 00:00:00)        To help prevent nausea and vomiting after your treatment, we encourage you to take your nausea medication as directed.  BELOW ARE SYMPTOMS THAT SHOULD BE REPORTED IMMEDIATELY: *FEVER GREATER THAN 100.4 F (38 C) OR HIGHER *CHILLS OR SWEATING *NAUSEA AND VOMITING THAT IS NOT CONTROLLED WITH YOUR NAUSEA MEDICATION *UNUSUAL SHORTNESS OF BREATH *UNUSUAL BRUISING OR BLEEDING *URINARY PROBLEMS (pain or burning when urinating, or frequent urination) *BOWEL PROBLEMS (unusual diarrhea, constipation, pain near the anus) TENDERNESS IN MOUTH AND THROAT WITH OR WITHOUT PRESENCE OF ULCERS (sore throat, sores in mouth, or a toothache) UNUSUAL RASH, SWELLING OR PAIN  UNUSUAL VAGINAL DISCHARGE OR ITCHING   Items with * indicate a potential emergency and should be followed up as soon as possible or go to the Emergency Department if any problems should occur.  Please show the CHEMOTHERAPY ALERT CARD or  IMMUNOTHERAPY ALERT CARD at check-in to the Emergency Department and triage nurse.  Should you have questions after your visit or need to cancel or reschedule your appointment, please contact [facility] [phone]  and follow the prompts.  Office hours are 8:00 a.m. to 4:30 p.m. Monday - Friday. Please note that voicemails left after 4:00 p.m. may not be returned until the following business day.  We are closed weekends and major holidays. You have access to a nurse at all times for urgent questions. Please call the main number to the clinic [phone] and follow the prompts.  For any non-urgent questions, you may also contact your provider using MyChart. We now offer e-Visits for anyone 26 and older to request care online for  non-urgent symptoms. For details visit [email or web address].   Also download the MyChart app! Go to the app store, search "MyChart", open the app, select [facility], and log in with your MyChart username and password.

## 2022-08-11 NOTE — Patient Instructions (Signed)

## 2022-08-13 ENCOUNTER — Inpatient Hospital Stay: Payer: 59

## 2022-08-15 ENCOUNTER — Emergency Department (HOSPITAL_BASED_OUTPATIENT_CLINIC_OR_DEPARTMENT_OTHER)
Admission: EM | Admit: 2022-08-15 | Discharge: 2022-08-15 | Disposition: A | Discharge status: Home / Self Care | Payer: 59 | Attending: Emergency Medicine | Admitting: Emergency Medicine

## 2022-08-15 ENCOUNTER — Emergency Department (HOSPITAL_BASED_OUTPATIENT_CLINIC_OR_DEPARTMENT_OTHER): Payer: 59

## 2022-08-15 ENCOUNTER — Encounter (HOSPITAL_BASED_OUTPATIENT_CLINIC_OR_DEPARTMENT_OTHER): Payer: Self-pay

## 2022-08-15 ENCOUNTER — Other Ambulatory Visit: Payer: Self-pay

## 2022-08-15 DIAGNOSIS — R911 Solitary pulmonary nodule: Secondary | ICD-10-CM | POA: Diagnosis not present

## 2022-08-15 DIAGNOSIS — M79605 Pain in left leg: Secondary | ICD-10-CM | POA: Diagnosis not present

## 2022-08-15 DIAGNOSIS — I2510 Atherosclerotic heart disease of native coronary artery without angina pectoris: Secondary | ICD-10-CM | POA: Diagnosis not present

## 2022-08-15 DIAGNOSIS — R0602 Shortness of breath: Secondary | ICD-10-CM | POA: Diagnosis not present

## 2022-08-15 DIAGNOSIS — Z1152 Encounter for screening for COVID-19: Secondary | ICD-10-CM | POA: Diagnosis not present

## 2022-08-15 DIAGNOSIS — R918 Other nonspecific abnormal finding of lung field: Secondary | ICD-10-CM | POA: Diagnosis not present

## 2022-08-15 LAB — CBC WITH DIFFERENTIAL/PLATELET
Abs Immature Granulocytes: 0.01 10*3/uL (ref 0.00–0.07)
Basophils Absolute: 0 10*3/uL (ref 0.0–0.1)
Basophils Relative: 0 %
Eosinophils Absolute: 0 10*3/uL (ref 0.0–0.5)
Eosinophils Relative: 1 %
HCT: 31.6 % — ABNORMAL LOW (ref 36.0–46.0)
Hemoglobin: 10.6 g/dL — ABNORMAL LOW (ref 12.0–15.0)
Immature Granulocytes: 0 %
Lymphocytes Relative: 24 %
Lymphs Abs: 1 10*3/uL (ref 0.7–4.0)
MCH: 29.7 pg (ref 26.0–34.0)
MCHC: 33.5 g/dL (ref 30.0–36.0)
MCV: 88.5 fL (ref 80.0–100.0)
Monocytes Absolute: 0.1 10*3/uL (ref 0.1–1.0)
Monocytes Relative: 2 %
Neutro Abs: 3 10*3/uL (ref 1.7–7.7)
Neutrophils Relative %: 73 %
Platelets: 73 10*3/uL — ABNORMAL LOW (ref 150–400)
RBC: 3.57 MIL/uL — ABNORMAL LOW (ref 3.87–5.11)
RDW: 15.9 % — ABNORMAL HIGH (ref 11.5–15.5)
WBC: 4 10*3/uL (ref 4.0–10.5)
nRBC: 0 % (ref 0.0–0.2)

## 2022-08-15 LAB — RESP PANEL BY RT-PCR (RSV, FLU A&B, COVID)  RVPGX2
Influenza A by PCR: NEGATIVE
Influenza B by PCR: NEGATIVE
Resp Syncytial Virus by PCR: NEGATIVE
SARS Coronavirus 2 by RT PCR: NEGATIVE

## 2022-08-15 LAB — COMPREHENSIVE METABOLIC PANEL
ALT: 10 U/L (ref 0–44)
AST: 23 U/L (ref 15–41)
Albumin: 3.7 g/dL (ref 3.5–5.0)
Alkaline Phosphatase: 57 U/L (ref 38–126)
Anion gap: 10 (ref 5–15)
BUN: 28 mg/dL — ABNORMAL HIGH (ref 8–23)
CO2: 19 mmol/L — ABNORMAL LOW (ref 22–32)
Calcium: 8.4 mg/dL — ABNORMAL LOW (ref 8.9–10.3)
Chloride: 107 mmol/L (ref 98–111)
Creatinine, Ser: 0.94 mg/dL (ref 0.44–1.00)
GFR, Estimated: >60 mL/min (ref >60)
Glucose, Bld: 110 mg/dL — ABNORMAL HIGH (ref 70–99)
Potassium: 3.8 mmol/L (ref 3.5–5.1)
Sodium: 136 mmol/L (ref 135–145)
Total Bilirubin: 0.4 mg/dL (ref 0.3–1.2)
Total Protein: 6.2 g/dL — ABNORMAL LOW (ref 6.5–8.1)

## 2022-08-15 LAB — TROPONIN I (HIGH SENSITIVITY): Troponin I (High Sensitivity): 3 ng/L (ref <18)

## 2022-08-15 LAB — BRAIN NATRIURETIC PEPTIDE: B Natriuretic Peptide: 45.4 pg/mL (ref 0.0–100.0)

## 2022-08-15 MED ORDER — IOHEXOL 300 MG/ML  SOLN
100.0000 mL | Freq: Once | INTRAMUSCULAR | Status: AC | PRN
  Administered 2022-08-15: 100 mL via INTRAVENOUS

## 2022-08-15 NOTE — ED Triage Notes (Signed)
Pt to ED c/o [facility] with exertion. Reports ongoing for 5 days, progressively getting worse. Reports currently undergoing chemotherapy for ovarian cancer. Reports last treatment 7/29

## 2022-08-15 NOTE — ED Provider Notes (Signed)
[place] EMERGENCY DEPARTMENT AT [facility] Provider Note   CSN: [id number] Arrival date & time: 08/15/22  1514     History  Chief Complaint  Patient presents with   Shortness of Breath    Laura [name] is a 77 y.o. female.  Patient here with shortness of breath worse in the last few days.  She has had some shortness of breath throughout her last few weeks.  She has a history of GU cancer undergoing chemotherapy.  Just had chemotherapy a few days ago.  She has been also dealing with chronic left lower leg swelling since being diagnosed.  She had negative DVT studies in the past.  She denies any fevers or chills.  She has been struggling with some depression and anxiety.  She was prescribed antidepressants that sounds like by her oncologist here few days ago but has not started it.  She has no plan to hurt herself.  Sometimes she does think about suicide but he says she would never do that.  She is overall feeling better.  No chest pain or cough or sputum production or fever or chills.  No black or bloody stools.  No vaginal bleeding.  The history is provided by the patient.       Home Medications Prior to Admission medications   Medication Sig Start Date End Date Taking? Authorizing Provider  acetaminophen (TYLENOL) 500 MG tablet Take 1 tablet (500 mg total) by mouth every 6 (six) hours as needed. 05/16/20   Kerrin Champagne, MD  amLODipine (NORVASC) 10 MG tablet Take 1 tablet (10 mg total) by mouth daily. 07/29/22   Doreatha Massed, MD  Bismuth Subsalicylate (PEPTO-BISMOL MAX STRENGTH PO) Take 10 mLs by mouth every 8 (eight) hours as needed (IBS symptoms). 06/13/21   [provider]  Blood Glucose Monitoring Suppl (ONE TOUCH ULTRA 2) w/Device KIT Use as directed 04/06/15   Corwin Levins, MD  Cholecalciferol (VITAMIN D3) 50 MCG (2000 UT) TABS Take 2,000 Units by mouth daily.    [provider]  docusate sodium (COLACE) 100 MG capsule Take 2 capsules (200 mg  total) by mouth 2 (two) times daily. 07/01/22   Carnella Guadalajara, PA-C  escitalopram (LEXAPRO) 10 MG tablet Take 1 tablet (10 mg total) by mouth daily. 08/11/22   Doreatha Massed, MD  eszopiclone (LUNESTA) 2 MG TABS tablet Take 1 tablet (2 mg total) by mouth at bedtime as needed for sleep. Take immediately before bedtime 06/04/22   Corwin Levins, MD  Famotidine (PEPCID AC PO) Take 1 tablet by mouth daily as needed (indigestion).    [provider]  furosemide (LASIX) 20 MG tablet Take 1 tablet (20 mg total) by mouth daily as needed for edema (leg swelling). 07/01/22   Carnella Guadalajara, PA-C  glimepiride (AMARYL) 2 MG tablet Take 0.5 tablets (1 mg total) by mouth daily before breakfast. 1/2 of 2 mg tab 07/08/21   Corwin Levins, MD  glucose blood (ONETOUCH ULTRA) test strip USE TO CHECK BLOOD SUGARS TWO TIMES DAILY 03/22/21   Corwin Levins, MD  HYDROmorphone (DILAUDID) 4 MG tablet Take 4 mg by mouth at bedtime as needed for severe pain.    [provider]  hyoscyamine (ANASPAZ) 0.125 MG TBDP disintergrating tablet Place 1 tablet (0.125 mg total) under the tongue every 4 (four) hours as needed for up to 5 days for bladder spasms. 06/17/22 06/23/22  Zigmund Daniel., MD  lactulose [facility]) 10 GM/15ML solution  Take 30 mLs (20 g total) by mouth at bedtime. 07/01/22   Carnella Guadalajara, PA-C  Lancets [facility] ULTRASOFT) lancets 1 each by Other route as needed for other. Use as instructed 03/20/21   Corwin Levins, MD  lidocaine (LIDODERM) 5 % Place 1 patch onto the skin daily. Remove & Discard patch within 12 hours or as directed by MD 06/19/22   Doreatha Massed, MD  loratadine (CLARITIN) 10 MG tablet Take 10 mg by mouth daily.    [provider]  magnesium oxide (MAG-OX) 400 (240 Mg) MG tablet Take 1 tablet (400 mg total) by mouth daily. 07/01/22   Carnella Guadalajara, PA-C  polyethylene glycol (MIRALAX / GLYCOLAX) 17 g packet Take 17 g by mouth daily.     [provider]  prochlorperazine (COMPAZINE) 10 MG tablet Take 1 tablet (10 mg total) by mouth every 6 (six) hours as needed for nausea or vomiting. 07/14/22   Doreatha Massed, MD  solifenacin (VESICARE) 5 MG tablet Take 1 tablet (5 mg total) by mouth daily. 02/14/22   Corwin Levins, MD  vitamin B-12 (CYANOCOBALAMIN) 1000 MCG tablet Take 1,000 mcg by mouth daily.    [provider]  Wheat Dextrin (BENEFIBER) CHEW Chew 3 tablets by mouth daily.    [provider]      Allergies    Ciprofloxacin, Hydrocodone bit-homatrop mbr, Prednisone, Sulfa antibiotics, Alfuzosin, Codeine, Crestor [rosuvastatin calcium], Doxycycline, Gabapentin, Keflex [cephalexin], Myrbetriq [mirabegron er], Naproxen, and Statins    Review of Systems   Review of Systems  Physical Exam Updated Vital Signs BP 124/64   Pulse 82   Temp 98.5 F (36.9 C) (Oral)   Resp 16   Ht 5\' 3"  (1.6 m)   Wt 84.8 kg   SpO2 100%   BMI 33.13 kg/m  Physical Exam Vitals and nursing note reviewed.  Constitutional:      General: She is not in acute distress.    Appearance: She is well-developed. She is not ill-appearing.  HENT:     Head: Normocephalic and atraumatic.     Mouth/Throat:     Mouth: Mucous membranes are moist.  Eyes:     Extraocular Movements: Extraocular movements intact.     Conjunctiva/sclera: Conjunctivae normal.     Pupils: Pupils are equal, round, and reactive to light.  Cardiovascular:     Rate and Rhythm: Normal rate and regular rhythm.     Pulses: Normal pulses.     Heart sounds: Normal heart sounds. No murmur heard. Pulmonary:     Effort: Pulmonary effort is normal. No respiratory distress.     Breath sounds: Normal breath sounds. No decreased breath sounds, wheezing or rhonchi.  Abdominal:     Palpations: Abdomen is soft.     Tenderness: There is no abdominal tenderness.  Musculoskeletal:        General: No swelling. Normal range of motion.     Cervical back: Normal range  of motion and neck supple.     Right lower leg: No edema.     Left lower leg: No edema.  Skin:    General: Skin is warm and dry.     Capillary Refill: Capillary refill takes less than 2 seconds.  Neurological:     General: No focal deficit present.     Mental Status: She is alert.  Psychiatric:        Mood and Affect: Mood normal.     ED Results / Procedures / Treatments   Labs (  all labs ordered are listed, but only abnormal results are displayed) Labs Reviewed  CBC WITH DIFFERENTIAL/PLATELET - Abnormal; Notable for the following components:      Result Value   RBC 3.57 (*)    Hemoglobin 10.6 (*)    HCT 31.6 (*)    RDW 15.9 (*)    Platelets 73 (*)    All other components within normal limits  COMPREHENSIVE METABOLIC PANEL - Abnormal; Notable for the following components:   CO2 19 (*)    Glucose, Bld 110 (*)    BUN 28 (*)    Calcium 8.4 (*)    Total Protein 6.2 (*)    All other components within normal limits  RESP PANEL BY RT-PCR (RSV, FLU A&B, COVID)  RVPGX2  BRAIN NATRIURETIC PEPTIDE  TROPONIN I (HIGH SENSITIVITY)    EKG EKG Interpretation Date/Time:  Friday August 15 2022 15:32:42 EDT Ventricular Rate:  82 PR Interval:  134 QRS Duration:  74 QT Interval:  352 QTC Calculation: 411 R Axis:   127  Text Interpretation: Normal sinus rhythm Right axis deviation Low voltage QRS Abnormal ECG When compared with ECG of 27-Mar-2022 22:36, QRS axis Shifted right Confirmed by Virgina Norfolk [phone]) on 08/15/2022 3:35:39 PM  Radiology US Venous Img Lower  Left (DVT Study)  Result Date: 08/15/2022 CLINICAL DATA:  Lower extremity pain EXAM: Left LOWER EXTREMITY VENOUS DOPPLER ULTRASOUND TECHNIQUE: Gray-scale sonography with compression, as well as color and duplex ultrasound, were performed to evaluate the deep venous system(s) from the level of the common femoral vein through the popliteal and proximal calf veins. COMPARISON:  None Available. FINDINGS: VENOUS Normal  compressibility of the common femoral, superficial femoral, and popliteal veins, as well as the visualized calf veins. Visualized portions of profunda femoral vein and great saphenous vein unremarkable. No filling defects to suggest DVT on grayscale or color Doppler imaging. Doppler waveforms show normal direction of venous flow, normal respiratory plasticity and response to augmentation. Limited views of the contralateral common femoral vein are unremarkable. OTHER None. Limitations: none IMPRESSION: No evidence of left lower extremity DVT. Electronically Signed   By: Karen Kays M.D.   On: 08/15/2022 18:47   CT Angio Chest PE W and/or Wo Contrast  Result Date: 08/15/2022 CLINICAL DATA:  Shortness of breath. History of metastatic ovarian cancer. EXAM: CT ANGIOGRAPHY CHEST WITH CONTRAST TECHNIQUE: Multidetector CT imaging of the chest was performed using the standard protocol during bolus administration of intravenous contrast. Multiplanar CT image reconstructions and MIPs were obtained to evaluate the vascular anatomy. RADIATION DOSE REDUCTION: This exam was performed according to the departmental dose-optimization program which includes automated exposure control, adjustment of the mA and/or kV according to patient size and/or use of iterative reconstruction technique. CONTRAST:  OMNIPAQUE IOHEXOL 300 MG/ML  SOLN COMPARISON:  Chest x-ray earlier 08/15/2022. CT scan chest 04/04/2019 FINDINGS: Cardiovascular: Right upper chest port is accessed. Tip extends into the internal jugular vein extends down to the right atrium. Coronary artery calcifications are seen. Heart is nonenlarged. No pericardial effusion. The thoracic aorta has a normal course and caliber with scattered vascular calcifications. No segmental or larger pulmonary embolism identified. Mediastinum/Nodes: No abnormal lymph node enlargement identified in the axillary regions or hilum. There is however abnormal lymph node identified to left of  the esophagus at the level of the carina measuring 2.2 by 1.6 cm on series 4, image 48. This is new from the prior CT scan. There is a moderate hiatal hernia. Lungs/Pleura: No consolidation,  pneumothorax or effusion. No edema. 4 mm right upper lobe lung nodule on series 6 6 image 22. Not clearly seen previously. Upper Abdomen: Abnormal lymph nodes identified retrocrural. Example on the right previously has a short axis of 13 mm and today on series 4, image 115 short axis of 21 mm. Long axis of 27 mm, increasing. Several nodes are identified. There also other nodes in the upper retroperitoneum. Recommend dedicated workup when appropriate. Musculoskeletal: Moderate degenerative changes along the spine. There is some areas of sclerosis identified along the sternum, unchanged from previous examination from 2021. Review of the MIP images confirms the above findings. IMPRESSION: No segmental or larger pulmonary embolism identified. 4 mm right upper lobe lung nodule. Although likely benign, if the patient is high-risk, given the morphology and/or location of this nodule a non-contrast chest CT can be considered in 12 months.This recommendation follows the consensus statement: Guidelines for Management of Incidental Pulmonary Nodules Detected on CT Images: From the Fleischner Society 2017; Radiology 2017; 284:228-243. Increasing abnormal lymph nodes identified in the mediastinum, retrocrural regions and upper abdomen. Recommend further workup. Patient has a history of ovarian cancer. Moderate hiatal hernia. Aortic Atherosclerosis (ICD10-I70.0). Electronically Signed   By: Karen Kays M.D.   On: 08/15/2022 18:00   DG Chest Portable 1 View  Result Date: 08/15/2022 CLINICAL DATA:  Shortness of breath. History of metastatic ovarian cancer. EXAM: PORTABLE CHEST 1 VIEW COMPARISON:  03/27/2022 x-ray and older FINDINGS: Stable right IJ chest port with tip along the central SVC above the right atrium. No consolidation,  pneumothorax or effusion. Minimal left basilar scar or atelectasis. Normal cardiopericardial silhouette without edema. Overlapping cardiac leads. Degenerative changes of the shoulders and spine. IMPRESSION: Chest port.  Minimal left basilar scar or atelectasis. Electronically Signed   By: Karen Kays M.D.   On: 08/15/2022 16:17    Procedures Procedures    Medications Ordered in ED Medications  iohexol (OMNIPAQUE) 300 MG/ML solution 100 mL (100 mLs Intravenous Contrast Given 08/15/22 1733)    ED Course/ Medical Decision Making/ A&P                                 Medical Decision Making Amount and/or Complexity of Data Reviewed Labs: ordered. Radiology: ordered.  Risk Prescription drug management.   KERENSA NICKLAS is here with shortness of breath.  Unremarkable vitals.  No fever.  History of metastatic ovarian cancer undergoing chemotherapy.  She denies any vaginal bleeding or dark or bloody stools.  Chemotherapy a few days ago.  She has been having some thoughts of depression anxiety and may be some passive suicidal thoughts but she has no plan to hurt herself.  I am able to contract for safety.  She was prescribed an antidepressant by her doctor here few days ago but has not filled it yet.  She is unsure if she wants to take it.  Overall she is feeling little bit more short of breath than normal.  She has ongoing left lower leg swelling that is from her cancer process she says.  She is not on blood thinners.  She has not had a blood clot as far she knows.  She denies any cough or sputum production.  She not having any chest pain.  Differential diagnosis could be an infectious process versus less likely ACS or PE or DVT.  Could be anemia.  Could be stress/anxiety related.  She appears well.  Reassuring vitals.  EKG shows first-degree heart block but no obvious ischemic changes.  Unchanged from prior.  Will get CBC, CMP, troponin, BNP, DVT study of the left lower extremity, chest x-ray.  Will  likely consider CT chest.  Per my review and interpretation of labs is no significant anemia or electrolyte abnormality or kidney injury or leukocytosis.  Troponin normal.  BNP unremarkable.  CT scan of the chest showed no PE per radiology report.  DVT study per radiology reports unremarkable.  No pneumonia.  Incidental pulmonary nodule.  Given her cancer history we will have her follow-up with her primary care doctor and oncologist about this.  Overall suspect this could be stress/anxiety related.  She was just recently prescribed medicine for this.  I encouraged her to consider start taking this.  Otherwise I feel safe with her.  She states that she would return if she develops worsening suicidal thoughts or depression but at this time she does not have any intent to hurt herself.  I can contract for safety.  Patient was discharged in good condition.  Family aware.  This chart was dictated using voice recognition software.  Despite best efforts to proofread,  errors can occur which can change the documentation meaning.         Final Clinical Impression(s) / ED Diagnoses Final diagnoses:  SOB (shortness of breath)  Pulmonary nodule    Rx / DC Orders ED Discharge Orders     None         Virgina Norfolk, DO 08/15/22 1924

## 2022-08-15 NOTE — Progress Notes (Signed)
Patient called stating that she is weak, exhausted, out of breath. Patient advised to go the emergency department. Patient is agreeable.

## 2022-08-15 NOTE — Discharge Instructions (Signed)
Follow-up with your oncologist and discuss your pulmonary nodule on your visit today.  Consider taking your antidepressant medicine that they prescribed you.  Please return if symptoms worsen.

## 2022-08-15 NOTE — ED Notes (Signed)
Reviewed AVS with patient, patient expressed understanding of directions, denies further questions at this time. 

## 2022-08-15 NOTE — ED Notes (Signed)
While in triage pt does report passive suicidal thoughts,pt tearful, reports would never harm self because against her religion, but does cross her mind with everything she is going through, pt daughter accompanies pt.

## 2022-08-18 ENCOUNTER — Ambulatory Visit: Payer: Self-pay | Admitting: Licensed Clinical Social Worker

## 2022-08-18 NOTE — Patient Instructions (Signed)
Social Work Visit Information  Thank you for taking time to visit with me today. Please don't hesitate to contact me if I can be of assistance to you.   Following are the goals we discussed today:   Goals Addressed             This Visit's Progress    Care Coordination Activiites Reduce symptoms of anxiety related to health concerns       Activities and task to complete in order to accomplish goals.   You have decided not to move forward with counseling and would like to work with me on brief coping skills to assist you with managing symptoms of anxiety Start / continue relaxed breathing 3 times daily Keep all upcoming appointment discussed today Continue with compliance of taking medication prescribed by Doctor Make sure you have the name and phone number of your [facility] Navigator  Complete Advance Directive packet,  Have advance directive notarized and provide a copy to provider office   Continue going to your support group bi weekly when you are able to discussed her healing journey with cancer treatment Call your oncologist today to discuss the concerns you had and recommendation on your after visit summary from the ED         Our next appointment is by telephone on 08/26/22    Please call the care guide team at [phone] if you need to cancel or reschedule your appointment.   If you or anyone you know are experiencing a Mental Health or Behavioral Health Crisis or need someone to talk to, please call the Suicide and Crisis Lifeline: 988 call the [place] National Suicide Prevention Lifeline: (361)483-9075 or TTY: 6827398892 TTY (939)099-1421) to talk to a trained counselor call 1-800-273-TALK (toll free, 24 hour hotline) go to [facility] [address], [place] [phone])   Patient verbalizes understanding of instructions and care plan provided today and agrees to view in MyChart. Active MyChart status and patient understanding of  how to access instructions and care plan via MyChart confirmed with patient.       [name], LCSW Social Work Care Coordination  [facility] [employer] [phone]

## 2022-08-18 NOTE — Patient Outreach (Signed)
  Care Coordination  Follow Up Visit Note   08/18/2022 Name: Laura [name] MRN: [id number] DOB: July 20, 1945  Laura [name] is a 77 y.o. year old female who sees Corwin Levins, MD for primary care. I spoke with  [name] by phone today.  What matters to the patients health and wellness today?    Patient continues to experience difficulty with pain, sleep, and increased anxiety with her cancer treatments. Had recent ED visit for SOB. She was tearful during this encounter.    Goals Addressed             This Visit's Progress    Care Coordination Activiites Reduce symptoms of anxiety related to health concerns       Activities and task to complete in order to accomplish goals.   You have decided not to move forward with counseling and would like to work with me on brief coping skills to assist you with managing symptoms of anxiety Start / continue relaxed breathing 3 times daily Keep all upcoming appointment discussed today Continue with compliance of taking medication prescribed by Doctor Make sure you have the name and phone number of your [facility] Navigator  Complete Advance Directive packet,  Have advance directive notarized and provide a copy to provider office   Continue going to your support group bi weekly when you are able to discussed her healing journey with cancer treatment Call your oncologist today to discuss the concerns you had and recommendation on your after visit summary from the ED        SDOH assessments and interventions completed:  No   Care Coordination Interventions:  Yes, provided  Interventions Today    Flowsheet Row Most Recent Value  Chronic Disease   Chronic disease during today's visit Hypertension (HTN), Diabetes  General Interventions   General Interventions Discussed/Reviewed General Interventions Reviewed  Education Interventions   Education Provided Provided Education  Provided Verbal Education On Mental Health/Coping with Illness  Mental  Health Interventions   Mental Health Discussed/Reviewed Mental Health Reviewed, Anxiety, Depression, Coping Strategies  [active listeing,  emotional support,  solution focused,  task centered]       Follow up plan: Follow up call scheduled for 1 week    Encounter Outcome:  Pt. Visit Completed   [name], LCSW Social Work Care Coordination  [facility] [employer] [phone]

## 2022-08-20 NOTE — Progress Notes (Deleted)
[name] [address], [place] [address] Phone: [phone] Fax: [phone]  SYMPTOM MANAGEMENT CLINIC PROGRESS NOTE   Laura [name] [id number] 08/23/1945 77 y.o.  [name] is managed by Dr. Ellin Saba for recurrent ovarian serous carcinoma   Actively treated with chemotherapy/immunotherapy/hormonal therapy: YES   Current therapy: Carboplatin + paclitaxel + bevacizumab (since cycle #2)   Last treated: Cycle 3 / Day 1 on 08/11/2022  INTERVAL HISTORY:  Chief Complaint: ***Please review scans from 8/2 ER and let me know if you want to see her prior to the 19th. She is extremely weak and SOB since visit. ***  Laura [name]  ***  ***ED visit + CT scan ***  ASSESSMENT & PLAN:  # *** - *** - PLAN: ***  # *** - *** - PLAN: ***  # *** - *** - PLAN: ***  # *** - *** - PLAN: ***  # Ovarian cancer - ***Scheduled for CT on 08/25/2022, with labs, MD visit, next cycle of chemotherapy on 09/01/2022 - PLAN: ***  PLAN SUMMARY: >> *** >> *** >> *** >> Next scheduled appointment with main oncologist: ***   REVIEW OF SYSTEMS: ***  Review of Systems  Past Medical History, Surgical history, Social history, and Family history were reviewed as documented elsewhere in chart, and were updated as appropriate.   OBJECTIVE:  Physical Exam: *** There were no vitals taken for this visit. ECOG: ***  Physical Exam  Lab Review:     Component Value Date/Time   NA 136 08/15/2022 1624   K 3.8 08/15/2022 1624   CL 107 08/15/2022 1624   CO2 19 (L) 08/15/2022 1624   GLUCOSE 110 (H) 08/15/2022 1624   BUN 28 (H) 08/15/2022 1624   CREATININE 0.94 08/15/2022 1624   CREATININE 0.98 05/30/2020 1016   CALCIUM 8.4 (L) 08/15/2022 1624   PROT 6.2 (L) 08/15/2022 1624   ALBUMIN 3.7 08/15/2022 1624   AST 23 08/15/2022 1624   ALT 10 08/15/2022 1624   ALKPHOS 57 08/15/2022 1624   BILITOT 0.4 08/15/2022 1624   GFRNONAA >60 08/15/2022 1624    GFRNONAA >60 05/30/2020 1016   GFRAA 57 (L) 10/06/2019 0754       Component Value Date/Time   WBC 4.0 08/15/2022 1624   RBC 3.57 (L) 08/15/2022 1624   HGB 10.6 (L) 08/15/2022 1624   HCT 31.6 (L) 08/15/2022 1624   PLT 73 (L) 08/15/2022 1624   MCV 88.5 08/15/2022 1624   MCH 29.7 08/15/2022 1624   MCHC 33.5 08/15/2022 1624   RDW 15.9 (H) 08/15/2022 1624   LYMPHSABS 1.0 08/15/2022 1624   MONOABS 0.1 08/15/2022 1624   EOSABS 0.0 08/15/2022 1624   BASOSABS 0.0 08/15/2022 1624   -------------------------------  Imaging from last 24 hours (if applicable): Radiology interpretation: US Venous Img Lower  Left (DVT Study)  Result Date: 08/15/2022 CLINICAL DATA:  Lower extremity pain EXAM: Left LOWER EXTREMITY VENOUS DOPPLER ULTRASOUND TECHNIQUE: Gray-scale sonography with compression, as well as color and duplex ultrasound, were performed to evaluate the deep venous system(s) from the level of the common femoral vein through the popliteal and proximal calf veins. COMPARISON:  None Available. FINDINGS: VENOUS Normal compressibility of the common femoral, superficial femoral, and popliteal veins, as well as the visualized calf veins. Visualized portions of profunda femoral vein and great saphenous vein unremarkable. No filling defects to suggest DVT on grayscale or color Doppler imaging. Doppler waveforms show normal direction of venous flow, normal  respiratory plasticity and response to augmentation. Limited views of the contralateral common femoral vein are unremarkable. OTHER None. Limitations: none IMPRESSION: No evidence of left lower extremity DVT. Electronically Signed   By: Karen Kays M.D.   On: 08/15/2022 18:47   CT Angio Chest PE W and/or Wo Contrast  Result Date: 08/15/2022 CLINICAL DATA:  Shortness of breath. History of metastatic ovarian cancer. EXAM: CT ANGIOGRAPHY CHEST WITH CONTRAST TECHNIQUE: Multidetector CT imaging of the chest was performed using the standard protocol during bolus  administration of intravenous contrast. Multiplanar CT image reconstructions and MIPs were obtained to evaluate the vascular anatomy. RADIATION DOSE REDUCTION: This exam was performed according to the departmental dose-optimization program which includes automated exposure control, adjustment of the mA and/or kV according to patient size and/or use of iterative reconstruction technique. CONTRAST:  OMNIPAQUE IOHEXOL 300 MG/ML  SOLN COMPARISON:  Chest x-ray earlier 08/15/2022. CT scan chest 04/04/2019 FINDINGS: Cardiovascular: Right upper chest port is accessed. Tip extends into the internal jugular vein extends down to the right atrium. Coronary artery calcifications are seen. Heart is nonenlarged. No pericardial effusion. The thoracic aorta has a normal course and caliber with scattered vascular calcifications. No segmental or larger pulmonary embolism identified. Mediastinum/Nodes: No abnormal lymph node enlargement identified in the axillary regions or hilum. There is however abnormal lymph node identified to left of the esophagus at the level of the carina measuring 2.2 by 1.6 cm on series 4, image 48. This is new from the prior CT scan. There is a moderate hiatal hernia. Lungs/Pleura: No consolidation, pneumothorax or effusion. No edema. 4 mm right upper lobe lung nodule on series 6 6 image 22. Not clearly seen previously. Upper Abdomen: Abnormal lymph nodes identified retrocrural. Example on the right previously has a short axis of 13 mm and today on series 4, image 115 short axis of 21 mm. Long axis of 27 mm, increasing. Several nodes are identified. There also other nodes in the upper retroperitoneum. Recommend dedicated workup when appropriate. Musculoskeletal: Moderate degenerative changes along the spine. There is some areas of sclerosis identified along the sternum, unchanged from previous examination from 2021. Review of the MIP images confirms the above findings. IMPRESSION: No segmental or larger  pulmonary embolism identified. 4 mm right upper lobe lung nodule. Although likely benign, if the patient is high-risk, given the morphology and/or location of this nodule a non-contrast chest CT can be considered in 12 months.This recommendation follows the consensus statement: Guidelines for Management of Incidental Pulmonary Nodules Detected on CT Images: From the Fleischner Society 2017; Radiology 2017; 284:228-243. Increasing abnormal lymph nodes identified in the mediastinum, retrocrural regions and upper abdomen. Recommend further workup. Patient has a history of ovarian cancer. Moderate hiatal hernia. Aortic Atherosclerosis (ICD10-I70.0). Electronically Signed   By: Karen Kays M.D.   On: 08/15/2022 18:00   DG Chest Portable 1 View  Result Date: 08/15/2022 CLINICAL DATA:  Shortness of breath. History of metastatic ovarian cancer. EXAM: PORTABLE CHEST 1 VIEW COMPARISON:  03/27/2022 x-ray and older FINDINGS: Stable right IJ chest port with tip along the central SVC above the right atrium. No consolidation, pneumothorax or effusion. Minimal left basilar scar or atelectasis. Normal cardiopericardial silhouette without edema. Overlapping cardiac leads. Degenerative changes of the shoulders and spine. IMPRESSION: Chest port.  Minimal left basilar scar or atelectasis. Electronically Signed   By: Karen Kays M.D.   On: 08/15/2022 16:17      WRAP UP:  All questions were answered. The patient knows  to call the clinic with any problems, questions or concerns.  Medical decision making: ***  Time spent on visit: I spent {CHL ONC TIME VISIT - WJXBJ:[id number]} counseling the patient face to face. The total time spent in the appointment was {CHL ONC TIME VISIT - YQMVH:[id number]} and more than 50% was on counseling.  Carnella Guadalajara, PA-C  ***

## 2022-08-21 ENCOUNTER — Inpatient Hospital Stay: Payer: 59

## 2022-08-21 ENCOUNTER — Inpatient Hospital Stay: Payer: 59 | Attending: Hematology | Admitting: Physician Assistant

## 2022-08-21 ENCOUNTER — Other Ambulatory Visit: Payer: Self-pay | Admitting: Internal Medicine

## 2022-08-21 ENCOUNTER — Telehealth: Payer: Self-pay

## 2022-08-21 DIAGNOSIS — Z90722 Acquired absence of ovaries, bilateral: Secondary | ICD-10-CM | POA: Insufficient documentation

## 2022-08-21 DIAGNOSIS — D509 Iron deficiency anemia, unspecified: Secondary | ICD-10-CM | POA: Insufficient documentation

## 2022-08-21 DIAGNOSIS — Z803 Family history of malignant neoplasm of breast: Secondary | ICD-10-CM | POA: Insufficient documentation

## 2022-08-21 DIAGNOSIS — Z9221 Personal history of antineoplastic chemotherapy: Secondary | ICD-10-CM | POA: Insufficient documentation

## 2022-08-21 DIAGNOSIS — D631 Anemia in chronic kidney disease: Secondary | ICD-10-CM | POA: Insufficient documentation

## 2022-08-21 DIAGNOSIS — D696 Thrombocytopenia, unspecified: Secondary | ICD-10-CM | POA: Insufficient documentation

## 2022-08-21 DIAGNOSIS — N189 Chronic kidney disease, unspecified: Secondary | ICD-10-CM | POA: Insufficient documentation

## 2022-08-21 DIAGNOSIS — C772 Secondary and unspecified malignant neoplasm of intra-abdominal lymph nodes: Secondary | ICD-10-CM | POA: Insufficient documentation

## 2022-08-21 DIAGNOSIS — Z808 Family history of malignant neoplasm of other organs or systems: Secondary | ICD-10-CM | POA: Insufficient documentation

## 2022-08-21 DIAGNOSIS — I129 Hypertensive chronic kidney disease with stage 1 through stage 4 chronic kidney disease, or unspecified chronic kidney disease: Secondary | ICD-10-CM | POA: Insufficient documentation

## 2022-08-21 DIAGNOSIS — Z90710 Acquired absence of both cervix and uterus: Secondary | ICD-10-CM | POA: Insufficient documentation

## 2022-08-21 DIAGNOSIS — D72819 Decreased white blood cell count, unspecified: Secondary | ICD-10-CM | POA: Insufficient documentation

## 2022-08-21 DIAGNOSIS — R630 Anorexia: Secondary | ICD-10-CM | POA: Insufficient documentation

## 2022-08-21 DIAGNOSIS — C563 Malignant neoplasm of bilateral ovaries: Secondary | ICD-10-CM | POA: Insufficient documentation

## 2022-08-21 NOTE — Telephone Encounter (Signed)
Transition Care Management Follow-up Telephone Call Date of discharge and from where: [facility] 8/2 How have you been since you were released from the hospital? PT is out of breath and her mental health is the same still feeling depressed   Any questions or concerns? Yes  Items Reviewed: Did the pt receive and understand the discharge instructions provided? Yes  Medications obtained and verified? Yes  Other? No  Any new allergies since your discharge? No  Dietary orders reviewed? Yes Do you have support at home? No     Follow up appointments reviewed:  PCP Hospital f/u appt confirmed? Yes  Scheduled to see PCP on 8/13 @ . Specialist Hospital f/u appt confirmed? No  Scheduled to see  on  @ . Are transportation arrangements needed? No  If their condition worsens, is the pt aware to call PCP or go to the Emergency Dept.? Yes Was the patient provided with contact information for the PCP's office or ED? Yes Was to pt encouraged to call back with questions or concerns? Yes    Lenard Forth [facility] Guide, [facility] [phone] [address] [place], [place], [place] [address] Phone: [phone] Email: [email or web address]

## 2022-08-22 ENCOUNTER — Ambulatory Visit: Payer: Self-pay

## 2022-08-22 NOTE — Patient Instructions (Signed)
Visit Information  Thank you for taking time to visit with me today. Please don't hesitate to contact me if I can be of assistance to you.   Following are the goals we discussed today:  Attend provider visits as scheduled Take medications as prescribed Contact provider with health questions or concerns as needed Contact your oncology care team with questions related to your condition/treatment plan as needed   If you are experiencing a Mental Health or Behavioral Health Crisis or need someone to talk to, please call the Suicide and Crisis Lifeline: 988 call the [place] National Suicide Prevention Lifeline: 9846218680 or TTY: 4137896388 TTY (343)343-6471) to talk to a trained counselor call 1-800-273-TALK (toll free, 24 hour hotline)  [name], RN, MSN, BSN, CCM [facility] Care Coordinator [phone]

## 2022-08-22 NOTE — Patient Outreach (Signed)
  Care Coordination   Follow Up Visit Note   08/22/2022 Name: Laura [name] MRN: [id number] DOB: 1945/01/22  Laura [name] is a 77 y.o. year old female who sees Corwin Levins, MD for primary care. I spoke with  Laura [name] by phone today.  What matters to the patients health and wellness today?  "I feel pretty good today".  She states she missed an appointment yesterday because she was not aware the appointment was made for her. Laura [name] with questions about her cancer treatment and reports she will follow up with her treatment team. She denies any additional care coordination needs from [facility] and states she will call RNCM if needed in the future.   Goals Addressed             This Visit's Progress    COMPLETED: Assistance with managing health concerns       Interventions Today    Flowsheet Row Most Recent Value  Chronic Disease   Chronic disease during today's visit Other  General Interventions   General Interventions Discussed/Reviewed General Interventions Reviewed, Doctor Visits  [encouraged to contact RNCM if care coordination needs in the future. confirmed she has RNCM's contact number]  Doctor Visits Discussed/Reviewed PCP, Doctor Visits Discussed  [reviewed upcoming appointments]  PCP/Specialist Visits Compliance with follow-up visit  Education Interventions   Education Provided Provided Education  Provided Verbal Education On Medication, When to see the doctor  [advised to take medications as prescribed, attend provider visits as scheduled, contact oncology navigater and team with  questions or concerns regarding your treatment plan. reviewed upcoming appointments with patient-confimred she has tranportation.]  Nutrition Interventions   Nutrition Discussed/Reviewed Nutrition Reviewed           COMPLETED: Care Coordination Activities       Interventions Today    Flowsheet Row Most Recent Value  General Interventions   General Interventions Discussed/Reviewed  Communication with  Communication with Social Work  [care coordination: patient reached out to RN/LCSW to notify of recent hospitalization due to pain.]            SDOH assessments and interventions completed:  No  Care Coordination Interventions:  Yes, provided   Follow up plan: No further intervention required.   Encounter Outcome:  Pt. Visit Completed   [name], RN, MSN, BSN, CCM [facility] Care Coordinator [phone]

## 2022-08-25 ENCOUNTER — Ambulatory Visit (HOSPITAL_BASED_OUTPATIENT_CLINIC_OR_DEPARTMENT_OTHER)
Admission: RE | Admit: 2022-08-25 | Discharge: 2022-08-25 | Disposition: A | Discharge status: Home / Self Care | Payer: 59 | Source: Ambulatory Visit | Attending: Hematology | Admitting: Hematology

## 2022-08-25 DIAGNOSIS — I700 Atherosclerosis of aorta: Secondary | ICD-10-CM | POA: Diagnosis not present

## 2022-08-25 DIAGNOSIS — J9811 Atelectasis: Secondary | ICD-10-CM | POA: Insufficient documentation

## 2022-08-25 DIAGNOSIS — C569 Malignant neoplasm of unspecified ovary: Secondary | ICD-10-CM | POA: Insufficient documentation

## 2022-08-25 DIAGNOSIS — K449 Diaphragmatic hernia without obstruction or gangrene: Secondary | ICD-10-CM | POA: Diagnosis not present

## 2022-08-25 DIAGNOSIS — Z9049 Acquired absence of other specified parts of digestive tract: Secondary | ICD-10-CM | POA: Diagnosis not present

## 2022-08-25 DIAGNOSIS — Z923 Personal history of irradiation: Secondary | ICD-10-CM | POA: Insufficient documentation

## 2022-08-25 DIAGNOSIS — C772 Secondary and unspecified malignant neoplasm of intra-abdominal lymph nodes: Secondary | ICD-10-CM | POA: Diagnosis not present

## 2022-08-25 MED ORDER — IOHEXOL 300 MG/ML  SOLN
100.0000 mL | Freq: Once | INTRAMUSCULAR | Status: AC | PRN
  Administered 2022-08-25: 85 mL via INTRAVENOUS

## 2022-08-26 ENCOUNTER — Ambulatory Visit: Payer: Self-pay | Admitting: Licensed Clinical Social Worker

## 2022-08-26 ENCOUNTER — Encounter: Payer: Self-pay | Admitting: Internal Medicine

## 2022-08-26 ENCOUNTER — Other Ambulatory Visit: Payer: Self-pay

## 2022-08-26 ENCOUNTER — Ambulatory Visit (INDEPENDENT_AMBULATORY_CARE_PROVIDER_SITE_OTHER): Payer: 59 | Admitting: Internal Medicine

## 2022-08-26 VITALS — BP 124/78 | HR 70 | Temp 98.1°F | Ht 63.0 in | Wt 186.0 lb

## 2022-08-26 DIAGNOSIS — I10 Essential (primary) hypertension: Secondary | ICD-10-CM

## 2022-08-26 DIAGNOSIS — E559 Vitamin D deficiency, unspecified: Secondary | ICD-10-CM | POA: Diagnosis not present

## 2022-08-26 DIAGNOSIS — N1832 Chronic kidney disease, stage 3b: Secondary | ICD-10-CM

## 2022-08-26 DIAGNOSIS — E119 Type 2 diabetes mellitus without complications: Secondary | ICD-10-CM | POA: Diagnosis not present

## 2022-08-26 DIAGNOSIS — E785 Hyperlipidemia, unspecified: Secondary | ICD-10-CM

## 2022-08-26 DIAGNOSIS — F5101 Primary insomnia: Secondary | ICD-10-CM | POA: Diagnosis not present

## 2022-08-26 MED ORDER — TRAZODONE HCL 100 MG PO TABS: ORAL_TABLET | ORAL | 1 refills | Status: AC

## 2022-08-26 NOTE — Progress Notes (Unsigned)
Patient ID: RUA LUCUS, female   DOB: 04/12/1945, 77 y.o.   MRN: [id number]        Chief Complaint: follow up insomnia, htn, dm, low vit d, ckd 3a       HPI:  Laura [name] is a 77 y.o. female here to f/u with worsening insomnia, jsut can't stay asleep after 3-4 hrs nightly.  Pt denies chest pain, increased sob or doe, wheezing, orthopnea, PND, increased LE swelling, palpitations, dizziness or syncope.   Pt denies polydipsia, polyuria, or new focal neuro s/s.    Pt denies fever, wt loss, night sweats, loss of appetite, or other constitutional symptoms   Now taking chemo for progressive malignancy, amlodipine increased to 10 mg for higher bp with chemo per pt; , ms contin changed to dilaudid/oxycodone prn combination complicated by constipation now improved.  Did also have noted aug 2 CT with 4 mm pulm nodule with recommendation to conisder ct f/u at 12 mo.  Had CT abd pelvis yesterday, results pending.    Wt Readings from Last 3 Encounters:  08/26/22 186 lb (84.4 kg)  08/15/22 187 lb (84.8 kg)  08/11/22 187 lb (84.8 kg)   BP Readings from Last 3 Encounters:  08/26/22 124/78  08/15/22 (!) 164/83  08/11/22 (!) 150/72         Past Medical History:  Diagnosis Date   Anemia    Anxiety    Arthritis    back of neck, bones spurs on neck   Cancer (HCC)    ovarian cancer   Cataract both eyes    Cervical disc disease    Diabetes mellitus Type 2    Family history of adverse reaction to anesthesia    sister slow to awaken   Family history of thyroid cancer    GERD (gastroesophageal reflux disease)    Heart murmur    History of COVID-19 06/2020   pain in side took paxlovid x 5 days all symptoms resolved   History of radiation therapy    abdominal lymph nodes 01/21/2021-02/01/2021  Dr Antony Blackbird   Hydronephrosis 08/24/2020   Right Kidney (stable per CT)   Hyperlipidemia    Hypertension    IBS (irritable bowel syndrome)    Left shoulder frozen with limited rom    Mucoid cyst of joint     right thumb   Neuropathy 03/12/2021   hands/fingers both hands numb and tingle @ times   Port-A-Cath in place 07/21/2019   Reflux    Sleep apnea 03/12/2021   has not used cpap in 3 years   Vertigo 12/2010   none since treated at [facility]   Wears glasses for reading    Wears partial dentures upper    Past Surgical History:  Procedure Laterality Date   BLADDER SURGERY     x 3 at [facility]   BREAST EXCISIONAL BIOPSY Bilateral    BREAST SURGERY     fibroid cyst removed ? over 10 yrs ago at [facility] per pt on 03-12-2021   CHOLECYSTECTOMY     yrs ago   COLONOSCOPY  07/02/2020   and 09-19-2016   CYSTOSCOPY W/ URETERAL STENT PLACEMENT Right 03/13/2021   Procedure: CYSTOSCOPY WITH RETROGRADE PYELOGRAM/URETERAL STENT PLACEMENT;  Surgeon: Noel Christmas, MD;  Location: [facility];  Service: Urology;  Laterality: Right;  30 MINS   CYSTOSCOPY W/ URETERAL STENT PLACEMENT Right 10/01/2021   Procedure: CYSTOSCOPY WITH RETROGRADE PYELOGRAM/URETERAL STENT REPLACEMENT;  Surgeon: Noel Christmas, MD;  Location:  [facility];  Service: Urology;  Laterality: Right;   CYSTOSCOPY W/ URETERAL STENT PLACEMENT Bilateral 03/18/2022   Procedure: CYSTOSCOPY WITH BILATERAL RETROGRADE PYELOGRAM/RIGHT URETERAL STENT EXCHANGE;  Surgeon: Noel Christmas, MD;  Location: [facility];  Service: Urology;  Laterality: Bilateral;  1HR   CYSTOSCOPY WITH RETROGRADE PYELOGRAM, URETEROSCOPY AND STENT PLACEMENT Right 05/06/2022   Procedure: CYSTOSCOPY WITH RIGHT RETROGRADE PYELOGRAM, AND RIGHT URETERAL STENT EXCHANGE;  Surgeon: Noel Christmas, MD;  Location: [facility];  Service: Urology;  Laterality: Right;   fibroids removed     breast (both breasts) age 2 and total of 3 surgeries   history of chemotherapy     6 rounds june 2021   IR IMAGING GUIDED PORT INSERTION  07/26/2019   right   MASS EXCISION Right 06/26/2016   Procedure: EXCISION MUCOID TUMOR RIGHT THUMB, IP RIGHT THUMB;  Surgeon: Cindee Salt, MD;   Location: [facility];  Service: Orthopedics;  Laterality: Right;   ROBOTIC ASSISTED BILATERAL SALPINGO OOPHERECTOMY N/A 01/03/2020   Procedure: XI ROBOTIC ASSISTED BILATERAL SALPINGO OOPHORECTOMY, RADICAL TUMOR DEBULKING;  Surgeon: Adolphus Birchwood, MD;  Location: [facility];  Service: Gynecology;  Laterality: N/A;   ROBOTIC PELVIC AND PARA-AORTIC LYMPH NODE DISSECTION N/A 01/03/2020   Procedure: XI ROBOTIC PARA-AORTIC LYMPHADENECTOMY;  Surgeon: Adolphus Birchwood, MD;  Location: [facility];  Service: Gynecology;  Laterality: N/A;   VAGINAL HYSTERECTOMY     age late 90's    reports that she has never smoked. She has never used smokeless tobacco. She reports that she does not drink alcohol and does not use drugs. family history includes Breast cancer in her sister; Diabetes in her father, maternal aunt, and sister; Hypertension in her father, mother, sister, and another family member; Stroke in her sister; Thyroid cancer (age of onset: 35) in her daughter. Allergies  Allergen Reactions   Ciprofloxacin Swelling    Torn tendon   Hydrocodone Bit-Homatrop Mbr Other (See Comments)    Vertigo *pt strongly prefers to never take* took 3 years to recover from   Prednisone Anxiety    *pt strongly prefers to never be given prednisone*    Sulfa Antibiotics Hives, Itching and Swelling    Tongue swells   Alfuzosin Other (See Comments)    Weakness and fatigue   Codeine Itching   Crestor [Rosuvastatin Calcium] Other (See Comments)    Did something to memory     Doxycycline Other (See Comments)    Severe rectal Gas.   Gabapentin     disoriented   Keflex [Cephalexin] Diarrhea and Nausea And Vomiting   Myrbetriq Theodosia Paling Er] Swelling    Swelling of legs and feet   Naproxen Other (See Comments)    Stomach cramps/loud gas   Statins     Muscle weakness   Current Outpatient Medications on File Prior to Visit  Medication Sig Dispense Refill   acetaminophen (TYLENOL) 500 MG tablet Take 1 tablet (500 mg  total) by mouth every 6 (six) hours as needed. 30 tablet 0   amLODipine (NORVASC) 10 MG tablet Take 1 tablet (10 mg total) by mouth daily. 30 tablet 3   Bismuth Subsalicylate (PEPTO-BISMOL MAX STRENGTH PO) Take 10 mLs by mouth every 8 (eight) hours as needed (IBS symptoms).     Blood Glucose Monitoring Suppl (ONE TOUCH ULTRA 2) w/Device KIT Use as directed 1 each 0   Cholecalciferol (VITAMIN D3) 50 MCG (2000 UT) TABS Take 2,000 Units by mouth daily.     docusate sodium (COLACE)  100 MG capsule Take 2 capsules (200 mg total) by mouth 2 (two) times daily. 100 capsule 3   escitalopram (LEXAPRO) 10 MG tablet Take 1 tablet (10 mg total) by mouth daily. 30 tablet 3   Famotidine (PEPCID AC PO) Take 1 tablet by mouth daily as needed (indigestion).     furosemide (LASIX) 20 MG tablet Take 1 tablet (20 mg total) by mouth daily as needed for edema (leg swelling). 30 tablet 3   glimepiride (AMARYL) 2 MG tablet TAKE 1/2 TABLET (1MG ) BY MOUTH ONCE DAILY BEFORE BREAKFAST 45 tablet 7   glucose blood (ONETOUCH ULTRA) test strip USE TO CHECK BLOOD SUGARS TWO TIMES DAILY 100 strip 3   HYDROmorphone (DILAUDID) 4 MG tablet Take 4 mg by mouth at bedtime as needed for severe pain.     lactulose (CHRONULAC) 10 GM/15ML solution Take 30 mLs (20 g total) by mouth at bedtime. 473 mL 2   Lancets (ONETOUCH ULTRASOFT) lancets 1 each by Other route as needed for other. Use as instructed 100 each 3   lidocaine (LIDODERM) 5 % Place 1 patch onto the skin daily. Remove & Discard patch within 12 hours or as directed by MD 30 patch 2   loratadine (CLARITIN) 10 MG tablet Take 10 mg by mouth daily.     magnesium oxide (MAG-OX) 400 (240 Mg) MG tablet Take 1 tablet (400 mg total) by mouth daily. 30 tablet 3   polyethylene glycol (MIRALAX / GLYCOLAX) 17 g packet Take 17 g by mouth daily.     prochlorperazine (COMPAZINE) 10 MG tablet Take 1 tablet (10 mg total) by mouth every 6 (six) hours as needed for nausea or vomiting. 30 tablet 2    solifenacin (VESICARE) 5 MG tablet Take 1 tablet (5 mg total) by mouth daily. 90 tablet 3   vitamin B-12 (CYANOCOBALAMIN) 1000 MCG tablet Take 1,000 mcg by mouth daily.     Wheat Dextrin (BENEFIBER) CHEW Chew 3 tablets by mouth daily.     hyoscyamine (ANASPAZ) 0.125 MG TBDP disintergrating tablet Place 1 tablet (0.125 mg total) under the tongue every 4 (four) hours as needed for up to 5 days for bladder spasms. 30 tablet 0   No current facility-administered medications on file prior to visit.        ROS:  All others reviewed and negative.  Objective        PE:  BP 124/78 (BP Location: Right Arm, Patient Position: Sitting, Cuff Size: Normal)   Pulse 70   Temp 98.1 F (36.7 C) (Oral)   Ht 5\' 3"  (1.6 m)   Wt 186 lb (84.4 kg)   SpO2 99%   BMI 32.95 kg/m                 Constitutional: Pt appears in NAD               HENT: Head: NCAT.                Right Ear: External ear normal.                 Left Ear: External ear normal.                Eyes: . Pupils are equal, round, and reactive to light. Conjunctivae and EOM are normal               Nose: without d/c or deformity  Neck: Neck supple. Gross normal ROM               Cardiovascular: Normal rate and regular rhythm.                 Pulmonary/Chest: Effort normal and breath sounds without rales or wheezing.                Abd:  Soft, NT, ND, + BS, no organomegaly               Neurological: Pt is alert. At baseline orientation, motor grossly intact               Skin: Skin is warm. No rashes, no other new lesions, LE edema - trace to 1+ bilateral               Psychiatric: Pt behavior is normal without agitation   Micro: none  Cardiac tracings I have personally interpreted today:  none  Pertinent Radiological findings (summarize): none   Lab Results  Component Value Date   WBC 4.0 08/15/2022   HGB 10.6 (L) 08/15/2022   HCT 31.6 (L) 08/15/2022   PLT 73 (L) 08/15/2022   GLUCOSE 110 (H) 08/15/2022   CHOL 237 (H)  03/06/2021   TRIG 169.0 (H) 03/06/2021   HDL 49.20 03/06/2021   LDLDIRECT 172.0 02/28/2020   LDLCALC 154 (H) 03/06/2021   ALT 10 08/15/2022   AST 23 08/15/2022   NA 136 08/15/2022   K 3.8 08/15/2022   CL 107 08/15/2022   CREATININE 0.94 08/15/2022   BUN 28 (H) 08/15/2022   CO2 19 (L) 08/15/2022   TSH 1.38 03/06/2021   INR 1.0 06/06/2019   HGBA1C 6.0 (H) 04/28/2022   MICROALBUR 2.1 (H) 03/06/2021   Assessment/Plan:  DONIKA LIPSETT is a 77 y.o. Black or African American [2] female with  has a past medical history of Anemia, Anxiety, Arthritis, Cancer (HCC), Cataract both eyes, Cervical disc disease, Diabetes mellitus Type 2, Family history of adverse reaction to anesthesia, Family history of thyroid cancer, GERD (gastroesophageal reflux disease), Heart murmur, History of COVID-19 (06/2020), History of radiation therapy, Hydronephrosis (08/24/2020), Hyperlipidemia, Hypertension, IBS (irritable bowel syndrome), Left shoulder frozen with limited rom, Mucoid cyst of joint, Neuropathy (03/12/2021), Port-A-Cath in place (07/21/2019), Reflux, Sleep apnea (03/12/2021), Vertigo (12/2010), Wears glasses for reading, and Wears partial dentures upper.  Diabetes mellitus type 2, noninsulin dependent (HCC) Lab Results  Component Value Date   HGBA1C 6.0 (H) 04/28/2022   Stable, pt to continue current medical treatment glimeparide 1 mg qd   Vitamin D deficiency Last vitamin D Lab Results  Component Value Date   VD25OH 49.13 03/06/2021   Stable, cont oral replacement   Insomnia Mild to mod, for trazodone 50 - 100 mg at bedtime prn,  to f/u any worsening symptoms or concerns  Hypertension BP Readings from Last 3 Encounters:  08/26/22 124/78  08/15/22 (!) 164/83  08/11/22 (!) 150/72   Stable, pt to continue medical treatment amlodipine 10 qd   Stage 3b chronic kidney disease (CKD) (HCC) Lab Results  Component Value Date   CREATININE 0.94 08/15/2022   Stable overall, cont to avoid  nephrotoxins   Hyperlipidemia Lab Results  Component Value Date   LDLCALC 154 (H) 03/06/2021   Uncontrolled,, for lower chol diet, pt declines statin for now  Followup: Return in about 4 months (around 12/26/2022).  Oliver Barre, MD 08/27/2022 8:03 PM [facility]  Internal Medicine

## 2022-08-26 NOTE — Patient Outreach (Unsigned)
  Care Coordination  Follow Up Visit Note   08/26/2022 Name: RHYLAN BUOL MRN: [id number] DOB: Dec 24, 1945  SAFFIRE PASKINS is a 77 y.o. year old female who sees Corwin Levins, MD for primary care. I spoke with  [name] by phone today.  What matters to the patients health and wellness today?    Patient continues to experience stress related to her cancer treatment due to unanswered questions and needed clarification.  She has noticed a decrease in ability to complete her ADL's and would like to move forward with started the process to get a Scientist, product/process development. LCSW will collaborate with PCP.   Goals Addressed             This Visit's Progress    Care Coordination Activiites Reduce symptoms of anxiety related to health concerns       Activities and task to complete in order to accomplish goals.   You have decided not to move forward with counseling however your understand that I am unable to provide ongoing support for you continue relaxed breathing 3 times daily Keep all upcoming appointment discussed today Continue with compliance of taking medication prescribed by Doctor Make sure you have the name and phone number of your [facility] Navigator  Complete Advance Directive packet,  Have advance directive notarized and provide a copy to provider office   Per your request you would like to move forward with personal care services. I will work with Dr. [name] on this        SDOH assessments and interventions completed:  No{THN Tip this will not be part of the note when signed-REQUIRED REPORT FIELD DO NOT DELETE (Optional):27901}   Care Coordination Interventions:  Yes, provided {THN Tip this will not be part of the note when signed-REQUIRED REPORT FIELD DO NOT DELETE (Optional):27901} Interventions Today    Flowsheet Row Most Recent Value  Chronic Disease   Chronic disease during today's visit Hypertension (HTN), Diabetes, Chronic Kidney Disease/End Stage Renal Disease (ESRD)  General  Interventions   General Interventions Discussed/Reviewed General Interventions Reviewed, Level of Care  Level of Care Personal Care Services  [discussed levels of care  - would like to move forward with PCS]  Education Interventions   Education Provided Provided Education  Provided Verbal Education On Applications, Mental Health/Coping with Illness  Mental Health Interventions   Mental Health Discussed/Reviewed Mental Health Reviewed, Anxiety  [does not want to move forward with therapy]       Follow up plan: Follow up call scheduled for 09/02/2022    Encounter Outcome:  Pt. Visit Completed {THN Tip this will not be part of the note when signed-REQUIRED REPORT FIELD DO NOT DELETE (Optional):27901}  [name], LCSW Social Work Care Coordination  [facility] [employer] [phone]

## 2022-08-26 NOTE — Patient Instructions (Addendum)
Please take all new medication as prescribed - the trazodone for sleep  Please continue all other medications as before, and refills have been done if requested.  Please have the pharmacy call with any other refills you may need.  Please keep your appointments with your specialists as you may have planned  Please make an Appointment to return in 4 months, or sooner if needed

## 2022-08-26 NOTE — Patient Instructions (Signed)
Social Work Visit Information  Thank you for taking time to visit with me today. Please don't hesitate to contact me if I can be of assistance to you.   Following are the goals we discussed today:   Goals Addressed             This Visit's Progress    Care Coordination Activiites Reduce symptoms of anxiety related to health concerns       Activities and task to complete in order to accomplish goals.   You have decided not to move forward with counseling however your understand that I am unable to provide ongoing support for you continue relaxed breathing 3 times daily Keep all upcoming appointment discussed today Continue with compliance of taking medication prescribed by Doctor Make sure you have the name and phone number of your [facility] Navigator  Complete Advance Directive packet,  Have advance directive notarized and provide a copy to provider office   Per your request you would like to move forward with personal care services. I will work with Dr. [name] on this         Our next appointment is by telephone on 09/02/22 at 3:30   Please call the care guide team at [phone] if you need to cancel or reschedule your appointment.   If you or anyone you know are experiencing a Mental Health or Behavioral Health Crisis or need someone to talk to, please call the Suicide and Crisis Lifeline: 988 call the [place] National Suicide Prevention Lifeline: 863-525-2495 or TTY: 310-853-5061 TTY 252-020-3752) to talk to a trained counselor call 1-800-273-TALK (toll free, 24 hour hotline) go to [facility] [address], [place] [phone])   Patient verbalizes understanding of instructions and care plan provided today and agrees to view in MyChart. Active MyChart status and patient understanding of how to access instructions and care plan via MyChart confirmed with patient.       [name], LCSW Social Work Care Coordination  [facility]  [employer] [employer] [phone]

## 2022-08-27 ENCOUNTER — Encounter: Payer: Self-pay | Admitting: Internal Medicine

## 2022-08-27 NOTE — Assessment & Plan Note (Signed)
Lab Results  Component Value Date   CREATININE 0.94 08/15/2022   Stable overall, cont to avoid nephrotoxins

## 2022-08-27 NOTE — Assessment & Plan Note (Signed)
Lab Results  Component Value Date   LDLCALC 154 (H) 03/06/2021   Uncontrolled,, for lower chol diet, pt declines statin for now

## 2022-08-27 NOTE — Assessment & Plan Note (Signed)
Last vitamin D Lab Results  Component Value Date   VD25OH 49.13 03/06/2021   Stable, cont oral replacement

## 2022-08-27 NOTE — Assessment & Plan Note (Signed)
Mild to mod, for trazodone 50 - 100 mg at bedtime prn,  to f/u any worsening symptoms or concerns

## 2022-08-27 NOTE — Assessment & Plan Note (Signed)
Lab Results  Component Value Date   HGBA1C 6.0 (H) 04/28/2022   Stable, pt to continue current medical treatment glimeparide 1 mg qd

## 2022-08-27 NOTE — Assessment & Plan Note (Signed)
BP Readings from Last 3 Encounters:  08/26/22 124/78  08/15/22 (!) 164/83  08/11/22 (!) 150/72   Stable, pt to continue medical treatment amlodipine 10 qd

## 2022-08-28 ENCOUNTER — Encounter: Payer: Self-pay | Admitting: Licensed Clinical Social Worker

## 2022-08-28 NOTE — Patient Instructions (Signed)
Social Work Visit Information  Following are the goals we are working on:   Goals Addressed             This Visit's Progress    Care Coordination Activiites: personal care services       Activities and task to complete in order to accomplish goals.   You have decided not to move forward with counseling however your understand that I am unable to provide ongoing support for you continue relaxed breathing 3 times daily Keep all upcoming appointment discussed today Continue with compliance of taking medication prescribed by Doctor Make sure you have the name and phone number of your [facility] Navigator  Complete Advance Directive packet,  Have advance directive notarized and provide a copy to provider office   Personal Care Service form submitted to Dr. [name] 08/28/22 to complete         Patient was not contacted during this encounter.  LCSW collaborated with care team to accomplish patient's care plan goal    [name], [employer] Social Work Care Coordination  [facility] [employer] [employer] [phone]

## 2022-08-28 NOTE — Patient Outreach (Signed)
  Care Coordination  Collaboration  Note   08/28/2022 Name: Laura [name] MRN: [id number] DOB: [birth year]  Laura [name] is a 77 y.o. year old female who sees Corwin Levins, MD for primary care.    What matters to the patients health and wellness today?  Getting Personal Care Services  Patient was not interviewed or contacted during this encounter Conducted brief assessment, recommendations and relevant information discussed.   LCSW  collaborated during this encounter to assist with meeting patient's needs.  .    Goals Addressed             This Visit's Progress    Care Coordination Activiites: personal care services       Activities and task to complete in order to accomplish goals.   You have decided not to move forward with counseling however your understand that I am unable to provide ongoing support for you continue relaxed breathing 3 times daily Keep all upcoming appointment discussed today Continue with compliance of taking medication prescribed by Doctor Make sure you have the name and phone number of your [facility] Navigator  Complete Advance Directive packet,  Have advance directive notarized and provide a copy to provider office   Personal Care Service form submitted to Dr. [name] 08/28/22 to complete         SDOH assessments and interventions completed:  No  Care Coordination Interventions:  Yes, provided  Interventions Today    Flowsheet Row Most Recent Value  Chronic Disease   Chronic disease during today's visit Hypertension (HTN), Diabetes  General Interventions   General Interventions Discussed/Reviewed Level of Care, Communication with  PCP/Specialist Visits --  [provider, CMA and PCS agency]  Level of Care Personal Care Services  Applications Personal Care Services  Education Interventions   Applications Personal Care Services       Follow up plan: Follow up call scheduled for 09/04/22    Encounter Outcome:  Pt. Visit Completed   [name],  LCSW Social Work Care Coordination  [facility] [employer] [employer] [phone]

## 2022-09-01 ENCOUNTER — Inpatient Hospital Stay (HOSPITAL_BASED_OUTPATIENT_CLINIC_OR_DEPARTMENT_OTHER): Payer: 59 | Admitting: Hematology

## 2022-09-01 ENCOUNTER — Inpatient Hospital Stay: Payer: 59

## 2022-09-01 ENCOUNTER — Encounter: Payer: Self-pay | Admitting: Hematology

## 2022-09-01 VITALS — Wt 185.1 lb

## 2022-09-01 DIAGNOSIS — D72819 Decreased white blood cell count, unspecified: Secondary | ICD-10-CM | POA: Diagnosis not present

## 2022-09-01 DIAGNOSIS — Z808 Family history of malignant neoplasm of other organs or systems: Secondary | ICD-10-CM | POA: Diagnosis not present

## 2022-09-01 DIAGNOSIS — Z95828 Presence of other vascular implants and grafts: Secondary | ICD-10-CM

## 2022-09-01 DIAGNOSIS — D631 Anemia in chronic kidney disease: Secondary | ICD-10-CM | POA: Diagnosis not present

## 2022-09-01 DIAGNOSIS — Z90722 Acquired absence of ovaries, bilateral: Secondary | ICD-10-CM | POA: Diagnosis not present

## 2022-09-01 DIAGNOSIS — C563 Malignant neoplasm of bilateral ovaries: Secondary | ICD-10-CM

## 2022-09-01 DIAGNOSIS — I129 Hypertensive chronic kidney disease with stage 1 through stage 4 chronic kidney disease, or unspecified chronic kidney disease: Secondary | ICD-10-CM | POA: Diagnosis not present

## 2022-09-01 DIAGNOSIS — C772 Secondary and unspecified malignant neoplasm of intra-abdominal lymph nodes: Secondary | ICD-10-CM | POA: Diagnosis not present

## 2022-09-01 DIAGNOSIS — D509 Iron deficiency anemia, unspecified: Secondary | ICD-10-CM | POA: Diagnosis not present

## 2022-09-01 DIAGNOSIS — D696 Thrombocytopenia, unspecified: Secondary | ICD-10-CM | POA: Diagnosis not present

## 2022-09-01 DIAGNOSIS — N189 Chronic kidney disease, unspecified: Secondary | ICD-10-CM | POA: Diagnosis not present

## 2022-09-01 DIAGNOSIS — Z803 Family history of malignant neoplasm of breast: Secondary | ICD-10-CM | POA: Diagnosis not present

## 2022-09-01 DIAGNOSIS — Z9221 Personal history of antineoplastic chemotherapy: Secondary | ICD-10-CM | POA: Diagnosis not present

## 2022-09-01 DIAGNOSIS — Z90710 Acquired absence of both cervix and uterus: Secondary | ICD-10-CM | POA: Diagnosis not present

## 2022-09-01 DIAGNOSIS — R630 Anorexia: Secondary | ICD-10-CM | POA: Diagnosis not present

## 2022-09-01 LAB — CBC WITH DIFFERENTIAL/PLATELET
Abs Immature Granulocytes: 0.01 10*3/uL (ref 0.00–0.07)
Basophils Absolute: 0 10*3/uL (ref 0.0–0.1)
Basophils Relative: 0 %
Eosinophils Absolute: 0 10*3/uL (ref 0.0–0.5)
Eosinophils Relative: 1 %
HCT: 33.8 % — ABNORMAL LOW (ref 36.0–46.0)
Hemoglobin: 11.3 g/dL — ABNORMAL LOW (ref 12.0–15.0)
Immature Granulocytes: 0 %
Lymphocytes Relative: 23 %
Lymphs Abs: 0.8 10*3/uL (ref 0.7–4.0)
MCH: 30.2 pg (ref 26.0–34.0)
MCHC: 33.4 g/dL (ref 30.0–36.0)
MCV: 90.4 fL (ref 80.0–100.0)
Monocytes Absolute: 0.5 10*3/uL (ref 0.1–1.0)
Monocytes Relative: 15 %
Neutro Abs: 2.2 10*3/uL (ref 1.7–7.7)
Neutrophils Relative %: 61 %
Platelets: 118 10*3/uL — ABNORMAL LOW (ref 150–400)
RBC: 3.74 MIL/uL — ABNORMAL LOW (ref 3.87–5.11)
RDW: 15.9 % — ABNORMAL HIGH (ref 11.5–15.5)
WBC: 3.6 10*3/uL — ABNORMAL LOW (ref 4.0–10.5)
nRBC: 0 % (ref 0.0–0.2)

## 2022-09-01 LAB — COMPREHENSIVE METABOLIC PANEL
ALT: 9 U/L (ref 0–44)
AST: 27 U/L (ref 15–41)
Albumin: 3.6 g/dL (ref 3.5–5.0)
Alkaline Phosphatase: 64 U/L (ref 38–126)
Anion gap: 10 (ref 5–15)
BUN: 25 mg/dL — ABNORMAL HIGH (ref 8–23)
CO2: 22 mmol/L (ref 22–32)
Calcium: 9.4 mg/dL (ref 8.9–10.3)
Chloride: 103 mmol/L (ref 98–111)
Creatinine, Ser: 1.23 mg/dL — ABNORMAL HIGH (ref 0.44–1.00)
GFR, Estimated: 45 mL/min — ABNORMAL LOW (ref >60)
Glucose, Bld: 164 mg/dL — ABNORMAL HIGH (ref 70–99)
Potassium: 3.9 mmol/L (ref 3.5–5.1)
Sodium: 135 mmol/L (ref 135–145)
Total Bilirubin: 0.5 mg/dL (ref 0.3–1.2)
Total Protein: 7 g/dL (ref 6.5–8.1)

## 2022-09-01 LAB — MAGNESIUM: Magnesium: 1.8 mg/dL (ref 1.7–2.4)

## 2022-09-01 MED ORDER — MEGESTROL ACETATE 400 MG/10ML PO SUSP: 400.0000 mg | Freq: Two times a day (BID) | ORAL | 3 refills | Status: DC

## 2022-09-01 MED ORDER — LIDOCAINE 5 % EX PTCH: 1.0000 | MEDICATED_PATCH | CUTANEOUS | 3 refills | Status: AC

## 2022-09-01 MED ORDER — HEPARIN SOD (PORK) LOCK FLUSH 100 UNIT/ML IV SOLN
500.0000 [IU] | Freq: Once | INTRAVENOUS | Status: AC
  Administered 2022-09-01: 500 [IU] via INTRAVENOUS

## 2022-09-01 NOTE — Progress Notes (Signed)
DISCONTINUE ON PATHWAY REGIMEN - Ovarian     A cycle is every 21 days:     Bevacizumab-xxxx      Paclitaxel      Carboplatin   **Always confirm dose/schedule in your pharmacy ordering system**  REASON: Disease Progression PRIOR TREATMENT: [id number]: Bevacizumab 15 mg/kg + Carboplatin AUC=5 + Paclitaxel 175 mg/m2 q21 Days; Re-evaluate Every 3 Cycles, Treat until Complete Response, Unacceptable Toxicity, or Disease Progression TREATMENT RESPONSE: Progressive Disease (PD)  START ON PATHWAY REGIMEN - Ovarian     A cycle is every 21 days:     Fam-trastuzumab deruxtecan-nxki   **Always confirm dose/schedule in your pharmacy ordering system**  Patient Characteristics: Recurrent or Progressive Disease, Third Line, Platinum Resistant or < 6 Months Since Last Platinum Therapy, Low, Medium, or Unknown Folate Receptor Alpha Expression OR  Prior Mirvetuximab OR Not a Candidate for Mirvetuximab, HER2 Overexpression (IHC 3+) BRCA Mutation Status: Absent Therapeutic Status: Recurrent or Progressive Disease Line of Therapy: Third Line Folate Receptor Alpha (FR?) Expression: Unknown Mirvetuximab Candidacy: Candidate for Mirvetuximab AND No Prior Mirvetuximab 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

## 2022-09-01 NOTE — Progress Notes (Signed)
[facility] [address] [place] [address]    Clinic Day:  09/01/22   Referring physician: Corwin Levins, MD  Patient Care Team: Corwin Levins, MD as PCP - General (Internal Medicine) Parke Poisson, MD as PCP - Cardiology (Cardiology) Rachael Fee, MD as Attending Physician (Gastroenterology) Christia Reading, MD as Attending Physician (Otolaryngology) [place], Peter M, MD (Cardiology) Kerrin Champagne, MD (Inactive) as Consulting Physician (Orthopedic Surgery) Doreatha Massed, MD as Medical Oncologist (Oncology) Mickie Bail, RN as Oncology Nurse Navigator (Oncology) Soundra Pilon, LCSW as Social Worker (Licensed Clinical Social Worker)   ASSESSMENT & PLAN:   Assessment: 1.  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  Mass measures 6 x 5.8 cm and is partially calcified.  Separate solid component superior to this in the left periaortic region measures 3.5 cm, SUV 14.3.  Cystic component medial to the lower pole of the left kidney is without hypermetabolic activity, possibly a lymphocele. -CT-guided biopsy of the right retroperitoneal lymph node consistent with adenocarcinoma with psammoma bodies.  Morphology and immunotherapy consistent with ovarian serous carcinoma/primary peritoneal carcinoma. -Germline mutation testing was negative. -Foundation 1 testing shows MS-stable.  Loss of heterozygosity was less than 16%. -CA-125 346 on 06/23/2019. - 6 cycles of carboplatin and paclitaxel from 07/28/2019 through 11/17/2019 -CTAP on 10/04/2019 after 3 cycles of chemotherapy showed retroperitoneal adenopathy at the bifurcation measuring 5.2 x 2.9 cm, left para-aortic lymphadenopathy measuring 5 x 4.4 cm, both of them decreased in size when compared to most recent PET scan.  CT scan report says that one of the lesion has gotten bigger  but this was compared to CT scan from 04/02/2019. -PET scan on December 05, 2019 shows 4.8 x 4.2 cm fluid filled density in the left para-aortic region, non-FDG avid favoring benign retroperitoneal cyst versus lymphangioma.  Right/anterior para-aortic mixed cystic/solid lesions measuring 2.4 x 4.8 cm, SUV 2.5, previous SUV 19.5. -Robotic assisted laparoscopic total hysterectomy and bilateral salpingo-oophorectomy by Dr. Andrey Farmer on 01/03/2020. -Pathology showed soft tissue deformities 2 sites and psammomatous calcifications and chronic inflammation with no malignancy identified in the para-aortic lymph node.  Right salpingo-oophorectomy showed microscopic focus of residual adenocarcinoma less than 1 mm.  No malignancy in the left ovary.  [id number]. -As she had prior difficulty with chemotherapy, no further chemotherapy after surgery was recommended. - XRT to retroperitoneal nodal mass (IMRT) from 01/21/2021 through 02/01/2021.  10 fractions, 40 Gray. - Right ureteral stent placement on 03/13/2021 due to right hydronephrosis from retroperitoneal nodal mass. - CTAP (04/01/2021): Centrally necrotic lymph node has increased in size. - We discussed CT findings and reviewed images.  Last tumor marker was normal. - She complained of right lower quadrant pain radiating to the back which started on 05/11/2021 and ended on 05/13/2021 night. - 6 cycles of dose reduced carboplatin and paclitaxel from 05/22/2021 through 09/04/2021. - Germline mutation testing negative - Carboplatin and paclitaxel restarted on 06/24/2022 for recurrence, bevacizumab added with cycle 2 on 07/21/2022. - Guardant360 (07/24/2022): ERBB2 amplification with benefit from trastuzumab deruxtecan, plasma copy #2.3, medium (++).  MSI high not detected.  Plan: 1.  Recurrent ovarian serous carcinoma: - Reviewed CTAP from 08/25/2022: Progressive retroperitoneal nodal metastasis with involvement of right psoas muscle. - Labs today: Creatinine 1.23 and LFTs are  normal.  CBC with mild leukopenia and thrombocytopenia. - I have recommended discontinuing chemotherapy due  to progression. - Unfortunately NGS testing could not be done on previous biopsy from 2021 due to quantity not sufficient. - Guardant360 results showed ER B B2 amplification. - We discussed treatment options including Doxil.  She is not interested due to side effects. - We discussed treatment with Enhertu based on destiny data.  Quoted response rates around 40%. - She had echocardiogram done in July 2023 with normal EF.  Will proceed with Enhertu next week.  RTC with cycle 2.    2.  Hypertension: - Continue amlodipine 10 mg daily.  Blood pressure today is 137/80.  3.  Normocytic anemia: - Normocytic anemia from CKD, functional iron deficiency and myelosuppression.  Hemoglobin today is 11.3.   4.  Back and right groin pain: -Continue Dilaudid 4 mg every 8 hours as needed.  5.  Loss of appetite: - Will start Megace twice daily.  Orders Placed This Encounter  Procedures   Magnesium    Standing Status:   Future    Standing Expiration Date:   09/10/2023   CBC with Differential    Standing Status:   Future    Standing Expiration Date:   09/10/2023   Comprehensive metabolic panel    Standing Status:   Future    Standing Expiration Date:   09/10/2023   Magnesium    Standing Status:   Future    Standing Expiration Date:   10/01/2023   CBC with Differential    Standing Status:   Future    Standing Expiration Date:   10/01/2023   Comprehensive metabolic panel    Standing Status:   Future    Standing Expiration Date:   10/01/2023   Magnesium    Standing Status:   Future    Standing Expiration Date:   10/22/2023   CBC with Differential    Standing Status:   Future    Standing Expiration Date:   10/22/2023   Comprehensive metabolic panel    Standing Status:   Future    Standing Expiration Date:   10/22/2023   Magnesium    Standing Status:   Future    Standing Expiration Date:    11/12/2023   CBC with Differential    Standing Status:   Future    Standing Expiration Date:   11/12/2023   Comprehensive metabolic panel    Standing Status:   Future    Standing Expiration Date:   11/12/2023   Magnesium    Standing Status:   Future    Standing Expiration Date:   12/03/2023   CBC with Differential    Standing Status:   Future    Standing Expiration Date:   12/03/2023   Comprehensive metabolic panel    Standing Status:   Future    Standing Expiration Date:   12/03/2023   Magnesium    Standing Status:   Future    Standing Expiration Date:   12/24/2023   CBC with Differential    Standing Status:   Future    Standing Expiration Date:   12/24/2023   Comprehensive metabolic panel    Standing Status:   Future    Standing Expiration Date:   12/24/2023      Laura [name],acting as a scribe for Doreatha Massed, MD.,have documented all relevant documentation on the behalf of Doreatha Massed, MD,as directed by  Doreatha Massed, MD while in the presence of Doreatha Massed, MD.  I, Doreatha Massed MD, have reviewed the above documentation for accuracy and completeness, and I agree  with the above.     Doreatha Massed, MD   8/19/20245:18 PM  CHIEF COMPLAINT:   Diagnosis: Recurrent serous ovarian cancer.   Cancer Staging  No matching staging information was found for the patient.    Prior Therapy: Chemotherapy  Current Therapy: Carboplatin and paclitaxel and bevacizumab   HISTORY OF PRESENT ILLNESS:   Oncology History  Malignant neoplasm of both ovaries  06/20/2019 Initial Diagnosis   Primary ovarian adenocarcinoma, unspecified laterality (HCC)   07/01/2019 Genetic Testing   Foundation One     07/11/2019 Genetic Testing   Negative genetic testing:  No pathogenic variants detected on the [facility] Multi-Cancer Panel. The report date is 07/11/2019.  The Multi-Cancer Panel offered by [facility] includes sequencing and/or deletion  duplication testing of the following 85 genes: AIP, ALK, APC, ATM, AXIN2,BAP1,  BARD1, BLM, BMPR1A, BRCA1, BRCA2, BRIP1, CASR, CDC73, CDH1, CDK4, CDKN1B, CDKN1C, CDKN2A (p14ARF), CDKN2A (p16INK4a), CEBPA, CHEK2, CTNNA1, DICER1, DIS3L2, EGFR (c.2369C>T, p.Thr790Met variant only), EPCAM (Deletion/duplication testing only), FH, FLCN, GATA2, GPC3, GREM1 (Promoter region deletion/duplication testing only), HOXB13 (c.251G>A, p.Gly84Glu), HRAS, KIT, MAX, MEN1, MET, MITF (c.952G>A, p.Glu318Lys variant only), MLH1, MSH2, MSH3, MSH6, MUTYH, NBN, NF1, NF2, NTHL1, PALB2, PDGFRA, PHOX2B, PMS2, POLD1, POLE, POT1, PRKAR1A, PTCH1, PTEN, RAD50, RAD51C, RAD51D, RB1, RECQL4, RET, RNF43, RUNX1, SDHAF2, SDHA (sequence changes only), SDHB, SDHC, SDHD, SMAD4, SMARCA4, SMARCB1, SMARCE1, STK11, SUFU, TERC, TERT, TMEM127, TP53, TSC1, TSC2, VHL, WRN and WT1.   07/28/2019 - 09/04/2021 Chemotherapy   Patient is on Treatment Plan : OVARIAN Carboplatin (AUC 6) / Paclitaxel (175) q21d x 6 cycles     06/24/2022 - 08/11/2022 Chemotherapy   Patient is on Treatment Plan : OVARIAN Carboplatin + Paclitaxel + Bevacizumab q21d      09/10/2022 -  Chemotherapy   Patient is on Treatment Plan : Ovarian METASTATIC Fam-Trastuzumab Deruxtecan-nxki (Enhertu) (5.4) q21d        INTERVAL HISTORY:   Laura [name] is a 77 y.o. female presenting to clinic today for follow up of malignant neoplasm. She was last seen by me on 08/11/2022.  Since her last visit, she underwent a CT A/P on 08/25/22 that found: progressive retroperitoneal nodal metastases with involvement of the right psoas muscle; right double-pigtail ureteral stent in satisfactory position; and mild fullness of the bilateral collecting systems without frank hydronephrosis.  She presented to the ED on 08/15/22 for SOB. Labs and CT chest were normal, with no significant anemia, electrolyte abnormality, kidney injury, leukocytosis, PE, DVT, or pneumonia found.   Today, she states that she is doing well  overall. Her appetite level is at 25%. Her energy level is at 25%.   PAST MEDICAL HISTORY:   Past Medical History: Past Medical History:  Diagnosis Date   Anemia    Anxiety    Arthritis    back of neck, bones spurs on neck   Cancer (HCC)    ovarian cancer   Cataract both eyes    Cervical disc disease    Diabetes mellitus Type 2    Family history of adverse reaction to anesthesia    sister slow to awaken   Family history of thyroid cancer    GERD (gastroesophageal reflux disease)    Heart murmur    History of COVID-19 06/2020   pain in side took paxlovid x 5 days all symptoms resolved   History of radiation therapy    abdominal lymph nodes 01/21/2021-02/01/2021  Dr Antony Blackbird   Hydronephrosis 08/24/2020   Right Kidney (stable per CT)   Hyperlipidemia  Hypertension    IBS (irritable bowel syndrome)    Left shoulder frozen with limited rom    Mucoid cyst of joint    right thumb   Neuropathy 03/12/2021   hands/fingers both hands numb and tingle @ times   Port-A-Cath in place 07/21/2019   Reflux    Sleep apnea 03/12/2021   has not used cpap in 3 years   Vertigo 12/2010   none since treated at [facility]   Wears glasses for reading    Wears partial dentures upper     Surgical History: Past Surgical History:  Procedure Laterality Date   BLADDER SURGERY     x 3 at [facility]   BREAST EXCISIONAL BIOPSY Bilateral    BREAST SURGERY     fibroid cyst removed ? over 10 yrs ago at [facility] per pt on 03-12-2021   CHOLECYSTECTOMY     yrs ago   COLONOSCOPY  07/02/2020   and 09-19-2016   CYSTOSCOPY W/ URETERAL STENT PLACEMENT Right 03/13/2021   Procedure: CYSTOSCOPY WITH RETROGRADE PYELOGRAM/URETERAL STENT PLACEMENT;  Surgeon: Noel Christmas, MD;  Location: [facility];  Service: Urology;  Laterality: Right;  30 MINS   CYSTOSCOPY W/ URETERAL STENT PLACEMENT Right 10/01/2021   Procedure: CYSTOSCOPY WITH RETROGRADE PYELOGRAM/URETERAL STENT REPLACEMENT;  Surgeon: Noel Christmas, MD;  Location: [facility];  Service: Urology;  Laterality: Right;   CYSTOSCOPY W/ URETERAL STENT PLACEMENT Bilateral 03/18/2022   Procedure: CYSTOSCOPY WITH BILATERAL RETROGRADE PYELOGRAM/RIGHT URETERAL STENT EXCHANGE;  Surgeon: Noel Christmas, MD;  Location: [facility];  Service: Urology;  Laterality: Bilateral;  1HR   CYSTOSCOPY WITH RETROGRADE PYELOGRAM, URETEROSCOPY AND STENT PLACEMENT Right 05/06/2022   Procedure: CYSTOSCOPY WITH RIGHT RETROGRADE PYELOGRAM, AND RIGHT URETERAL STENT EXCHANGE;  Surgeon: Noel Christmas, MD;  Location: [facility];  Service: Urology;  Laterality: Right;   fibroids removed     breast (both breasts) age 70 and total of 3 surgeries   history of chemotherapy     6 rounds june 2021   IR IMAGING GUIDED PORT INSERTION  07/26/2019   right   MASS EXCISION Right 06/26/2016   Procedure: EXCISION MUCOID TUMOR RIGHT THUMB, IP RIGHT THUMB;  Surgeon: Cindee Salt, MD;  Location: [facility];  Service: Orthopedics;  Laterality: Right;   ROBOTIC ASSISTED BILATERAL SALPINGO OOPHERECTOMY N/A 01/03/2020   Procedure: XI ROBOTIC ASSISTED BILATERAL SALPINGO OOPHORECTOMY, RADICAL TUMOR DEBULKING;  Surgeon: Adolphus Birchwood, MD;  Location: [facility];  Service: Gynecology;  Laterality: N/A;   ROBOTIC PELVIC AND PARA-AORTIC LYMPH NODE DISSECTION N/A 01/03/2020   Procedure: XI ROBOTIC PARA-AORTIC LYMPHADENECTOMY;  Surgeon: Adolphus Birchwood, MD;  Location: [facility];  Service: Gynecology;  Laterality: N/A;   VAGINAL HYSTERECTOMY     age late 28's    Social History: Social History   Socioeconomic History   Marital status: Divorced    Spouse name: Not on file   Number of children: 4   Years of education: 16   Highest education level: Bachelor's degree (e.g., BA, AB, BS)  Occupational History   Occupation: retired Photographer  Tobacco Use   Smoking status: Never   Smokeless tobacco: Never  Vaping Use   Vaping status: Never Used  Substance and Sexual  Activity   Alcohol use: No    Alcohol/week: 0.0 standard drinks of alcohol   Drug use: No   Sexual activity: Not Currently    Birth control/protection: Surgical  Other Topics Concern   Not on  file  Social History Narrative   Not on file   Social Determinants of Health   Financial Resource Strain: Low Risk  (04/01/2022)   Overall Financial Resource Strain (CARDIA)    Difficulty of Paying Living Expenses: Not hard at all  Food Insecurity: Patient Declined (06/15/2022)   Hunger Vital Sign    Worried About Running Out of Food in the Last Year: Patient declined    Ran Out of Food in the Last Year: Patient declined  Transportation Needs: No Transportation Needs (06/25/2022)   PRAPARE - Administrator, Civil Service (Medical): No    Lack of Transportation (Non-Medical): No  Physical Activity: Unknown (04/01/2022)   Exercise Vital Sign    Days of Exercise per Week: 0 days    Minutes of Exercise per Session: Not on file  Recent Concern: Physical Activity - Inactive (04/01/2022)   Exercise Vital Sign    Days of Exercise per Week: 0 days    Minutes of Exercise per Session: 10 min  Stress: No Stress Concern Present (04/01/2022)   [employer] of Occupational Health - Occupational Stress Questionnaire    Feeling of Stress : Only a little  Recent Concern: Stress - Stress Concern Present (03/21/2022)   [employer] of Occupational Health - Occupational Stress Questionnaire    Feeling of Stress : Very much  Social Connections: Moderately Integrated (04/01/2022)   Social Connection and Isolation Panel [NHANES]    Frequency of Communication with Friends and Family: More than three times a week    Frequency of Social Gatherings with Friends and Family: Three times a week    Attends Religious Services: More than 4 times per year    Active Member of Clubs or Organizations: Yes    Attends Banker Meetings: More than 4 times per year    Marital Status: Divorced   Intimate Partner Violence: Not At Risk (06/15/2022)   Humiliation, Afraid, Rape, and Kick questionnaire    Fear of Current or Ex-Partner: No    Emotionally Abused: No    Physically Abused: No    Sexually Abused: No    Family History: Family History  Problem Relation Age of Onset   Hypertension Mother    Diabetes Father    Hypertension Father    Diabetes Sister    Breast cancer Sister        stage 0   Hypertension Sister    Stroke Sister    Diabetes Maternal Aunt    Thyroid cancer Daughter 71   Hypertension Other    Colon cancer Neg Hx    Esophageal cancer Neg Hx    Stomach cancer Neg Hx    Rectal cancer Neg Hx    Endometrial cancer Neg Hx    Ovarian cancer Neg Hx     Current Medications:  Current Outpatient Medications:    HYDROmorphone (DILAUDID) 4 MG tablet, Take by mouth every 4 (four) hours as needed for severe pain (PRN as needed.)., Disp: , Rfl:    lidocaine (LIDODERM) 5 %, Place 1 patch onto the skin daily. Remove & Discard patch within 12 hours or as directed by MD, Disp: 30 patch, Rfl: 3   megestrol (MEGACE) 400 MG/10ML suspension, Take 10 mLs (400 mg total) by mouth 2 (two) times daily., Disp: 480 mL, Rfl: 3   acetaminophen (TYLENOL) 500 MG tablet, Take 1 tablet (500 mg total) by mouth every 6 (six) hours as needed., Disp: 30 tablet, Rfl: 0   amLODipine (NORVASC) 10  MG tablet, Take 1 tablet (10 mg total) by mouth daily., Disp: 30 tablet, Rfl: 3   Bismuth Subsalicylate (PEPTO-BISMOL MAX STRENGTH PO), Take 10 mLs by mouth every 8 (eight) hours as needed (IBS symptoms)., Disp: , Rfl:    Blood Glucose Monitoring Suppl (ONE TOUCH ULTRA 2) w/Device KIT, Use as directed, Disp: 1 each, Rfl: 0   Cholecalciferol (VITAMIN D3) 50 MCG (2000 UT) TABS, Take 2,000 Units by mouth daily., Disp: , Rfl:    docusate sodium (COLACE) 100 MG capsule, Take 2 capsules (200 mg total) by mouth 2 (two) times daily., Disp: 100 capsule, Rfl: 3   Famotidine (PEPCID AC PO), Take 1 tablet by mouth  daily as needed (indigestion)., Disp: , Rfl:    furosemide (LASIX) 20 MG tablet, Take 1 tablet (20 mg total) by mouth daily as needed for edema (leg swelling)., Disp: 30 tablet, Rfl: 3   glimepiride (AMARYL) 2 MG tablet, TAKE 1/2 TABLET (1MG ) BY MOUTH ONCE DAILY BEFORE BREAKFAST, Disp: 45 tablet, Rfl: 7   glucose blood (ONETOUCH ULTRA) test strip, USE TO CHECK BLOOD SUGARS TWO TIMES DAILY, Disp: 100 strip, Rfl: 3   lactulose (CHRONULAC) 10 GM/15ML solution, Take 30 mLs (20 g total) by mouth at bedtime., Disp: 473 mL, Rfl: 2   Lancets (ONETOUCH ULTRASOFT) lancets, 1 each by Other route as needed for other. Use as instructed, Disp: 100 each, Rfl: 3   loratadine (CLARITIN) 10 MG tablet, Take 10 mg by mouth daily., Disp: , Rfl:    polyethylene glycol (MIRALAX / GLYCOLAX) 17 g packet, Take 17 g by mouth daily., Disp: , Rfl:    prochlorperazine (COMPAZINE) 10 MG tablet, Take 1 tablet (10 mg total) by mouth every 6 (six) hours as needed for nausea or vomiting., Disp: 30 tablet, Rfl: 2   solifenacin (VESICARE) 5 MG tablet, Take 1 tablet (5 mg total) by mouth daily., Disp: 90 tablet, Rfl: 3   traZODone (DESYREL) 100 MG tablet, 1/2 - 1 tab by mouth at bedtime for sleep as needed, Disp: 90 tablet, Rfl: 1   vitamin B-12 (CYANOCOBALAMIN) 1000 MCG tablet, Take 1,000 mcg by mouth daily., Disp: , Rfl:    Wheat Dextrin (BENEFIBER) CHEW, Chew 3 tablets by mouth daily., Disp: , Rfl:    Allergies: Allergies  Allergen Reactions   Ciprofloxacin Swelling    Torn tendon   Hydrocodone Bit-Homatrop Mbr Other (See Comments)    Vertigo *pt strongly prefers to never take* took 3 years to recover from   Prednisone Anxiety    *pt strongly prefers to never be given prednisone*    Sulfa Antibiotics Hives, Itching and Swelling    Tongue swells   Alfuzosin Other (See Comments)    Weakness and fatigue   Codeine Itching   Crestor [Rosuvastatin Calcium] Other (See Comments)    Did something to memory     Doxycycline Other  (See Comments)    Severe rectal Gas.   Gabapentin     disoriented   Keflex [Cephalexin] Diarrhea and Nausea And Vomiting   Myrbetriq [facility]os[facility]] Swelling    Swelling of legs and feet   Naproxen Other (See Comments)    Stomach cramps/loud gas   Statins     Muscle weakness    REVIEW OF SYSTEMS:   Review of Systems  Constitutional:  Negative for chills, fatigue and fever.  HENT:   Negative for lump/mass, mouth sores, nosebleeds, sore throat and trouble swallowing.   Eyes:  Negative for eye problems.  Respiratory:  Positive for shortness of breath. Negative for cough.   Cardiovascular:  Negative for chest pain, leg swelling and palpitations.  Gastrointestinal:  Positive for constipation, diarrhea and vomiting. Negative for abdominal pain.  Genitourinary:  Negative for bladder incontinence, difficulty urinating, dysuria, frequency, hematuria and nocturia.   Musculoskeletal:  Negative for arthralgias, back pain, flank pain, myalgias and neck pain.  Skin:  Negative for itching and rash.  Neurological:  Positive for headaches. Negative for numbness.  Hematological:  Does not bruise/bleed easily.  Psychiatric/Behavioral:  Positive for depression. Negative for sleep disturbance and suicidal ideas. The patient is nervous/anxious.   All other systems reviewed and are negative.    VITALS:   Weight 185 lb 1.6 oz (84 kg).  Wt Readings from Last 3 Encounters:  09/01/22 185 lb 1.6 oz (84 kg)  08/26/22 186 lb (84.4 kg)  08/15/22 187 lb (84.8 kg)    Body mass index is 32.79 kg/m.  Performance status (ECOG): 1 - Symptomatic but completely ambulatory  PHYSICAL EXAM:   Physical Exam Vitals and nursing note reviewed. Exam conducted with a chaperone present.  Constitutional:      Appearance: Normal appearance.  Cardiovascular:     Rate and Rhythm: Normal rate and regular rhythm.     Pulses: Normal pulses.     Heart sounds: Normal heart sounds.  Pulmonary:     Effort: Pulmonary  effort is normal.     Breath sounds: Normal breath sounds.  Abdominal:     Palpations: Abdomen is soft. There is no hepatomegaly, splenomegaly or mass.     Tenderness: There is no abdominal tenderness.  Musculoskeletal:     Right lower leg: No edema.     Left lower leg: No edema.  Lymphadenopathy:     Cervical: No cervical adenopathy.     Right cervical: No superficial, deep or posterior cervical adenopathy.    Left cervical: No superficial, deep or posterior cervical adenopathy.     Upper Body:     Right upper body: No supraclavicular or axillary adenopathy.     Left upper body: No supraclavicular or axillary adenopathy.  Neurological:     General: No focal deficit present.     Mental Status: She is alert and oriented to person, place, and time.  Psychiatric:        Mood and Affect: Mood normal.        Behavior: Behavior normal.     LABS:      Latest Ref Rng & Units 09/01/2022    8:23 AM 08/15/2022    4:24 PM 08/11/2022    8:22 AM  CBC  WBC 4.0 - 10.5 K/uL 3.6  4.0  4.8   Hemoglobin 12.0 - 15.0 g/dL 16.1  09.6  04.5   Hematocrit 36.0 - 46.0 % 33.8  31.6  35.4   Platelets 150 - 400 K/uL 118  73  133       Latest Ref Rng & Units 09/01/2022    8:23 AM 08/15/2022    4:24 PM 08/11/2022    8:22 AM  CMP  Glucose 70 - 99 mg/dL 409  811  914   BUN 8 - 23 mg/dL 25  28  24    Creatinine 0.44 - 1.00 mg/dL 7.82  9.56  2.13   Sodium 135 - 145 mmol/L 135  136  133   Potassium 3.5 - 5.1 mmol/L 3.9  3.8  3.8   Chloride 98 - 111 mmol/L 103  107  106   CO2  22 - 32 mmol/L 22  19  21    Calcium 8.9 - 10.3 mg/dL 9.4  8.4  9.2   Total Protein 6.5 - 8.1 g/dL 7.0  6.2  7.1   Total Bilirubin 0.3 - 1.2 mg/dL 0.5  0.4  0.6   Alkaline Phos 38 - 126 U/L 64  57  68   AST 15 - 41 U/L 27  23  24    ALT 0 - 44 U/L 9  10  12       No results found for: "CEA1", "CEA" / No results found for: "CEA1", "CEA" No results found for: "PSA1" No results found for: "CAN199" Lab Results  Component Value Date    CAN125 8.0 08/11/2022    No results found for: "TOTALPROTELP", "ALBUMINELP", "A1GS", "A2GS", "BETS", "BETA2SER", "GAMS", "MSPIKE", "SPEI" Lab Results  Component Value Date   TIBC 353 11/12/2021   FERRITIN 48 11/12/2021   IRONPCTSAT 12 11/12/2021   IRONPCTSAT 17.9 (L) 08/25/2018   Lab Results  Component Value Date   LDH 148 04/05/2019     STUDIES:   CT Abdomen Pelvis W Contrast  Result Date: 08/30/2022 CLINICAL DATA:  Follow-up ovarian cancer, on chemotherapy EXAM: CT ABDOMEN AND PELVIS WITH CONTRAST TECHNIQUE: Multidetector CT imaging of the abdomen and pelvis was performed using the standard protocol following bolus administration of intravenous contrast. RADIATION DOSE REDUCTION: This exam was performed according to the departmental dose-optimization program which includes automated exposure control, adjustment of the mA and/or kV according to patient size and/or use of iterative reconstruction technique. CONTRAST:  85mL OMNIPAQUE IOHEXOL 300 MG/ML  SOLN COMPARISON:  06/30/2022, 06/18/2022, and 06/14/2022 FINDINGS: Lower chest: Mild linear scarring/atelectasis in the left lower lobe. Hepatobiliary: Stable hypoperfusion along the falciform ligament (series 2/image 17) and posterior aspect of segment 4B (series 2/image 19). This appearance was not apparent on prior unenhanced CT dated 06/18/2022, but is unchanged on both enhanced studies dated 06/20/2022 and 06/14/2022, and does not suggest metastatic disease. Status post cholecystectomy. No intrahepatic or extrahepatic duct dilatation. Pancreas: Within normal limits. Spleen: Within normal limits. Adrenals/Urinary Tract: Adrenal glands are within normal limits. Kidneys are within normal limits. Right double-pigtail ureteral stent in satisfactory position. Mild fullness of the bilateral collecting systems without frank hydronephrosis. Bladder is within normal limits, noting a tiny focus of nondependent gas (series 2/image 54). Stomach/Bowel:  Stomach is notable for a moderate hiatal hernia. No evidence of bowel obstruction. Appendix is not discretely visualized. No colonic wall thickening or inflammatory changes. Vascular/Lymphatic: No evidence of abdominal aortic aneurysm. Atherosclerotic calcifications of the abdominal aorta and branch vessels, although vessels remain patent. Retroperitoneal lymphadenopathy, including: --2.3 cm short axis right retrocrural node (series 2/image 15), previously 13 mm --4.6 x 2.9 cm right psoas mass (series 2/image 24), previously 3.3 x 2.5 cm --5.7 x 4.1 cm retrocaval mass encasing the right ureter (series 2/image 31), previously 3.9 x 3.2 cm --6.6 x 4.2 cm mass along the right aortic bifurcation (series 2/image 39), previously 6.6 x 4.0 cm Reproductive: Status post hysterectomy and suspected bilateral salpingo oophorectomy. No adnexal masses. Other: No abdominopelvic ascites. No frank peritoneal disease/omental caking. Musculoskeletal: Degenerative changes of the visualized thoracolumbar spine. IMPRESSION: Status post hysterectomy and bilateral salpingo oophorectomy. Progressive retroperitoneal nodal metastases with involvement of the right psoas muscle, as above. Right double-pigtail ureteral stent in satisfactory position. Mild fullness of the bilateral collecting systems without frank hydronephrosis. Electronically Signed   By: Charline Bills M.D.   On: 08/30/2022 02:39   US Venous  Img Lower  Left (DVT Study)  Result Date: 08/15/2022 CLINICAL DATA:  Lower extremity pain EXAM: Left LOWER EXTREMITY VENOUS DOPPLER ULTRASOUND TECHNIQUE: Gray-scale sonography with compression, as well as color and duplex ultrasound, were performed to evaluate the deep venous system(s) from the level of the common femoral vein through the popliteal and proximal calf veins. COMPARISON:  None Available. FINDINGS: VENOUS Normal compressibility of the common femoral, superficial femoral, and popliteal veins, as well as the visualized  calf veins. Visualized portions of profunda femoral vein and great saphenous vein unremarkable. No filling defects to suggest DVT on grayscale or color Doppler imaging. Doppler waveforms show normal direction of venous flow, normal respiratory plasticity and response to augmentation. Limited views of the contralateral common femoral vein are unremarkable. OTHER None. Limitations: none IMPRESSION: No evidence of left lower extremity DVT. Electronically Signed   By: Karen Kays M.D.   On: 08/15/2022 18:47   CT Angio Chest PE W and/or Wo Contrast  Result Date: 08/15/2022 CLINICAL DATA:  Shortness of breath. History of metastatic ovarian cancer. EXAM: CT ANGIOGRAPHY CHEST WITH CONTRAST TECHNIQUE: Multidetector CT imaging of the chest was performed using the standard protocol during bolus administration of intravenous contrast. Multiplanar CT image reconstructions and MIPs were obtained to evaluate the vascular anatomy. RADIATION DOSE REDUCTION: This exam was performed according to the departmental dose-optimization program which includes automated exposure control, adjustment of the mA and/or kV according to patient size and/or use of iterative reconstruction technique. CONTRAST:  OMNIPAQUE IOHEXOL 300 MG/ML  SOLN COMPARISON:  Chest x-ray earlier 08/15/2022. CT scan chest 04/04/2019 FINDINGS: Cardiovascular: Right upper chest port is accessed. Tip extends into the internal jugular vein extends down to the right atrium. Coronary artery calcifications are seen. Heart is nonenlarged. No pericardial effusion. The thoracic aorta has a normal course and caliber with scattered vascular calcifications. No segmental or larger pulmonary embolism identified. Mediastinum/Nodes: No abnormal lymph node enlargement identified in the axillary regions or hilum. There is however abnormal lymph node identified to left of the esophagus at the level of the carina measuring 2.2 by 1.6 cm on series 4, image 48. This is new from the  prior CT scan. There is a moderate hiatal hernia. Lungs/Pleura: No consolidation, pneumothorax or effusion. No edema. 4 mm right upper lobe lung nodule on series 6 6 image 22. Not clearly seen previously. Upper Abdomen: Abnormal lymph nodes identified retrocrural. Example on the right previously has a short axis of 13 mm and today on series 4, image 115 short axis of 21 mm. Long axis of 27 mm, increasing. Several nodes are identified. There also other nodes in the upper retroperitoneum. Recommend dedicated workup when appropriate. Musculoskeletal: Moderate degenerative changes along the spine. There is some areas of sclerosis identified along the sternum, unchanged from previous examination from 2021. Review of the MIP images confirms the above findings. IMPRESSION: No segmental or larger pulmonary embolism identified. 4 mm right upper lobe lung nodule. Although likely benign, if the patient is high-risk, given the morphology and/or location of this nodule a non-contrast chest CT can be considered in 12 months.This recommendation follows the consensus statement: Guidelines for Management of Incidental Pulmonary Nodules Detected on CT Images: From the Fleischner Society 2017; Radiology 2017; 284:228-243. Increasing abnormal lymph nodes identified in the mediastinum, retrocrural regions and upper abdomen. Recommend further workup. Patient has a history of ovarian cancer. Moderate hiatal hernia. Aortic Atherosclerosis (ICD10-I70.0). Electronically Signed   By: Karen Kays M.D.   On: 08/15/2022 18:00  DG Chest Portable 1 View  Result Date: 08/15/2022 CLINICAL DATA:  Shortness of breath. History of metastatic ovarian cancer. EXAM: PORTABLE CHEST 1 VIEW COMPARISON:  03/27/2022 x-ray and older FINDINGS: Stable right IJ chest port with tip along the central SVC above the right atrium. No consolidation, pneumothorax or effusion. Minimal left basilar scar or atelectasis. Normal cardiopericardial silhouette without edema.  Overlapping cardiac leads. Degenerative changes of the shoulders and spine. IMPRESSION: Chest port.  Minimal left basilar scar or atelectasis. Electronically Signed   By: Karen Kays M.D.   On: 08/15/2022 16:17

## 2022-09-01 NOTE — Patient Instructions (Addendum)
[facility] at [facility] Discharge Instructions   You were seen and examined today by Dr. Ellin Saba.  He reviewed the results of your lab work which are normal/stable.   He reviewed the results of your CT scan. It shows the cancer is growing. We will need to change treatments.   He discussed with you two different treatment options. He discussed with you treatment with a drug called Doxil. This is a chemotherapy infusion that is given in the clinic every 4 weeks. The other option is an infusion called Enhertu. This is given in the clinic every 3 weeks.   Dr. Kirtland Bouchard recommends going with the Enhertu, as he thinks you will tolerate this medication better.   Return as scheduled.    Thank you for choosing [facility] at [facility] to provide your oncology and hematology care.  To afford each patient quality time with our provider, please arrive at least 15 minutes before your scheduled appointment time.   If you have a lab appointment with the Cancer Center please come in thru the Main Entrance and check in at the main information desk.  You need to re-schedule your appointment should you arrive 10 or more minutes late.  We strive to give you quality time with our providers, and arriving late affects you and other patients whose appointments are after yours.  Also, if you no show three or more times for appointments you may be dismissed from the clinic at the providers discretion.     Again, thank you for choosing [facility].  Our hope is that these requests will decrease the amount of time that you wait before being seen by our physicians.       _____________________________________________________________  Should you have questions after your visit to [facility], please contact our office at [phone] and follow the prompts.  Our office hours are 8:00 a.m. and 4:30 p.m. Monday - Friday.  Please note that voicemails  left after 4:00 p.m. may not be returned until the following business day.  We are closed weekends and major holidays.  You do have access to a nurse 24-7, just call the main number to the clinic [phone] and do not press any options, hold on the line and a nurse will answer the phone.    For prescription refill requests, have your pharmacy contact our office and allow 72 hours.    Due to Covid, you will need to wear a mask upon entering the hospital. If you do not have a mask, a mask will be given to you at the Main Entrance upon arrival. For doctor visits, patients may have 1 support person age 30 or older with them. For treatment visits, patients can not have anyone with them due to social distancing guidelines and our immunocompromised population.

## 2022-09-02 ENCOUNTER — Encounter (HOSPITAL_BASED_OUTPATIENT_CLINIC_OR_DEPARTMENT_OTHER): Payer: Self-pay

## 2022-09-02 ENCOUNTER — Other Ambulatory Visit (HOSPITAL_BASED_OUTPATIENT_CLINIC_OR_DEPARTMENT_OTHER): Payer: Self-pay

## 2022-09-02 ENCOUNTER — Other Ambulatory Visit: Payer: Self-pay

## 2022-09-02 ENCOUNTER — Emergency Department (HOSPITAL_BASED_OUTPATIENT_CLINIC_OR_DEPARTMENT_OTHER): Payer: 59

## 2022-09-02 ENCOUNTER — Encounter: Payer: 59 | Admitting: Licensed Clinical Social Worker

## 2022-09-02 ENCOUNTER — Emergency Department (HOSPITAL_BASED_OUTPATIENT_CLINIC_OR_DEPARTMENT_OTHER)
Admission: EM | Admit: 2022-09-02 | Discharge: 2022-09-02 | Disposition: A | Discharge status: Home / Self Care | Payer: 59 | Attending: Emergency Medicine | Admitting: Emergency Medicine

## 2022-09-02 DIAGNOSIS — M4316 Spondylolisthesis, lumbar region: Secondary | ICD-10-CM | POA: Diagnosis not present

## 2022-09-02 DIAGNOSIS — I10 Essential (primary) hypertension: Secondary | ICD-10-CM | POA: Diagnosis not present

## 2022-09-02 DIAGNOSIS — Z7984 Long term (current) use of oral hypoglycemic drugs: Secondary | ICD-10-CM | POA: Insufficient documentation

## 2022-09-02 DIAGNOSIS — M79604 Pain in right leg: Secondary | ICD-10-CM | POA: Insufficient documentation

## 2022-09-02 DIAGNOSIS — Z79899 Other long term (current) drug therapy: Secondary | ICD-10-CM | POA: Diagnosis not present

## 2022-09-02 DIAGNOSIS — Z8543 Personal history of malignant neoplasm of ovary: Secondary | ICD-10-CM | POA: Insufficient documentation

## 2022-09-02 DIAGNOSIS — M48061 Spinal stenosis, lumbar region without neurogenic claudication: Secondary | ICD-10-CM | POA: Diagnosis not present

## 2022-09-02 DIAGNOSIS — M545 Low back pain, unspecified: Secondary | ICD-10-CM | POA: Insufficient documentation

## 2022-09-02 DIAGNOSIS — M5127 Other intervertebral disc displacement, lumbosacral region: Secondary | ICD-10-CM | POA: Diagnosis not present

## 2022-09-02 DIAGNOSIS — E119 Type 2 diabetes mellitus without complications: Secondary | ICD-10-CM | POA: Diagnosis not present

## 2022-09-02 DIAGNOSIS — R109 Unspecified abdominal pain: Secondary | ICD-10-CM | POA: Insufficient documentation

## 2022-09-02 DIAGNOSIS — M5459 Other low back pain: Secondary | ICD-10-CM | POA: Diagnosis not present

## 2022-09-02 LAB — URINALYSIS, ROUTINE W REFLEX MICROSCOPIC
Bilirubin Urine: NEGATIVE
Glucose, UA: NEGATIVE mg/dL
Hgb urine dipstick: NEGATIVE
Ketones, ur: NEGATIVE mg/dL
Leukocytes,Ua: NEGATIVE
Nitrite: NEGATIVE
Protein, ur: NEGATIVE mg/dL
Specific Gravity, Urine: 1.008 (ref 1.005–1.030)
pH: 6.5 (ref 5.0–8.0)

## 2022-09-02 MED ORDER — LIDOCAINE 5 % EX PTCH
1.0000 | MEDICATED_PATCH | Freq: Once | CUTANEOUS | Status: DC
  Administered 2022-09-02: 1 via TRANSDERMAL
  Filled 2022-09-02: qty 1

## 2022-09-02 MED ORDER — ACETAMINOPHEN 500 MG PO TABS
1000.0000 mg | ORAL_TABLET | Freq: Once | ORAL | Status: AC
  Administered 2022-09-02: 1000 mg via ORAL
  Filled 2022-09-02: qty 2

## 2022-09-02 MED ORDER — CYCLOBENZAPRINE HCL 5 MG PO TABS
5.0000 mg | ORAL_TABLET | Freq: Once | ORAL | Status: AC
  Administered 2022-09-02: 5 mg via ORAL
  Filled 2022-09-02: qty 1

## 2022-09-02 MED ORDER — CYCLOBENZAPRINE HCL 10 MG PO TABS
5.0000 mg | ORAL_TABLET | Freq: Two times a day (BID) | ORAL | 0 refills | Status: DC | PRN
  Filled 2022-09-02: qty 10, 10d supply, fill #0

## 2022-09-02 MED ORDER — HYDROMORPHONE HCL 1 MG/ML IJ SOLN
0.5000 mg | Freq: Once | INTRAMUSCULAR | Status: AC
  Administered 2022-09-02: 0.5 mg via INTRAMUSCULAR
  Filled 2022-09-02: qty 1

## 2022-09-02 NOTE — ED Triage Notes (Signed)
Pt to ED c/o lower back pain, right leg pain, right flank pain x 1 week, progressively getting worse. Denies urinary symptoms, Reports hx of same symptoms and was told "because of cancer" Reports currently undergoing chemotherapy for ovarian cancer.

## 2022-09-02 NOTE — ED Provider Notes (Signed)
[place] EMERGENCY DEPARTMENT AT [facility] Provider Note   CSN: [id number] Arrival date & time: 09/02/22  1112     History  Chief Complaint  Patient presents with   Back Pain   Flank Pain   Leg Pain    right    Laura [name] is a 77 y.o. female.  Patient is a 77 year old female with a past medical history of metastatic ovarian cancer on chemotherapy, hypertension and diabetes presenting to the emergency department with back pain.  The patient reports that she has had intermittent back pain for the last few months however she states that her pain is worsened over the last few days.  She states that it started a few days ago on a small area of her right lower back and is spread up her back and down the posterior part of her right leg.  She denies any numbness or weakness, saddle anesthesia, loss of bowel or bladder function, dysuria or hematuria.  She states that she is on hydromorphone for pain at home and has been taking Tylenol with some relief.  She states that her pain is worse when she sits or stands and with ambulation.  The history is provided by the patient.  Back Pain Associated symptoms: leg pain   Flank Pain  Leg Pain Associated symptoms: back pain        Home Medications Prior to Admission medications   Medication Sig Start Date End Date Taking? Authorizing Provider  cyclobenzaprine (FLEXERIL) 10 MG tablet Take 0.5 tablets (5 mg total) by mouth 2 (two) times daily as needed for muscle spasms. 09/02/22  Yes Elayne Snare K, DO  acetaminophen (TYLENOL) 500 MG tablet Take 1 tablet (500 mg total) by mouth every 6 (six) hours as needed. 05/16/20   Kerrin Champagne, MD  amLODipine (NORVASC) 10 MG tablet Take 1 tablet (10 mg total) by mouth daily. 07/29/22   Doreatha Massed, MD  Bismuth Subsalicylate (PEPTO-BISMOL MAX STRENGTH PO) Take 10 mLs by mouth every 8 (eight) hours as needed (IBS symptoms). 06/13/21   [provider]  Blood Glucose  Monitoring Suppl (ONE TOUCH ULTRA 2) w/Device KIT Use as directed 04/06/15   Corwin Levins, MD  Cholecalciferol (VITAMIN D3) 50 MCG (2000 UT) TABS Take 2,000 Units by mouth daily.    [provider]  docusate sodium (COLACE) 100 MG capsule Take 2 capsules (200 mg total) by mouth 2 (two) times daily. 07/01/22   Carnella Guadalajara, PA-C  Famotidine (PEPCID AC PO) Take 1 tablet by mouth daily as needed (indigestion).    [provider]  furosemide (LASIX) 20 MG tablet Take 1 tablet (20 mg total) by mouth daily as needed for edema (leg swelling). 07/01/22   Carnella Guadalajara, PA-C  glimepiride (AMARYL) 2 MG tablet TAKE 1/2 TABLET (1MG ) BY MOUTH ONCE DAILY BEFORE BREAKFAST 08/21/22   Corwin Levins, MD  glucose blood [facility] ULTRA) test strip USE TO CHECK BLOOD SUGARS TWO TIMES DAILY 03/22/21   Corwin Levins, MD  HYDROmorphone (DILAUDID) 4 MG tablet Take by mouth every 4 (four) hours as needed for severe pain (PRN as needed.).    [provider]  lactulose (CHRONULAC) 10 GM/15ML solution Take 30 mLs (20 g total) by mouth at bedtime. 07/01/22   Carnella Guadalajara, PA-C  Lancets [facility] ULTRASOFT) lancets 1 each by Other route as needed for other. Use as instructed 03/20/21   Corwin Levins, MD  lidocaine (LIDODERM) 5 % Place  1 patch onto the skin daily. Remove & Discard patch within 12 hours or as directed by MD 09/01/22   Doreatha Massed, MD  loratadine (CLARITIN) 10 MG tablet Take 10 mg by mouth daily.    [provider]  megestrol (MEGACE) 400 MG/10ML suspension Take 10 mLs (400 mg total) by mouth 2 (two) times daily. 09/01/22   Doreatha Massed, MD  polyethylene glycol (MIRALAX / GLYCOLAX) 17 g packet Take 17 g by mouth daily.    [provider]  prochlorperazine (COMPAZINE) 10 MG tablet Take 1 tablet (10 mg total) by mouth every 6 (six) hours as needed for nausea or vomiting. 07/14/22   Doreatha Massed, MD  solifenacin (VESICARE) 5 MG tablet  Take 1 tablet (5 mg total) by mouth daily. 02/14/22   Corwin Levins, MD  traZODone (DESYREL) 100 MG tablet 1/2 - 1 tab by mouth at bedtime for sleep as needed 08/26/22   Corwin Levins, MD  vitamin B-12 (CYANOCOBALAMIN) 1000 MCG tablet Take 1,000 mcg by mouth daily.    [provider]  Wheat Dextrin (BENEFIBER) CHEW Chew 3 tablets by mouth daily.    [provider]      Allergies    Ciprofloxacin, Hydrocodone bit-homatrop mbr, Prednisone, Sulfa antibiotics, Alfuzosin, Codeine, Crestor [rosuvastatin calcium], Doxycycline, Gabapentin, Keflex [cephalexin], Myrbetriq [mirabegron er], Naproxen, and Statins    Review of Systems   Review of Systems  Genitourinary:  Positive for flank pain.  Musculoskeletal:  Positive for back pain.    Physical Exam Updated Vital Signs BP 125/65   Pulse 71   Temp 98 F (36.7 C)   Resp 17   Ht 5\' 3"  (1.6 m)   Wt 84.4 kg   SpO2 97%   BMI 32.95 kg/m  Physical Exam Vitals and nursing note reviewed.  Constitutional:      General: She is not in acute distress.    Appearance: Normal appearance.  HENT:     Head: Normocephalic and atraumatic.     Nose: Nose normal.     Mouth/Throat:     Mouth: Mucous membranes are moist.  Eyes:     Extraocular Movements: Extraocular movements intact.     Conjunctiva/sclera: Conjunctivae normal.  Neck:     Comments: No midline neck tenderness Cardiovascular:     Rate and Rhythm: Normal rate and regular rhythm.     Heart sounds: Normal heart sounds.  Pulmonary:     Effort: Pulmonary effort is normal.     Breath sounds: Normal breath sounds.  Abdominal:     General: Abdomen is flat.     Palpations: Abdomen is soft.     Tenderness: There is no abdominal tenderness. There is no right CVA tenderness or left CVA tenderness.  Musculoskeletal:        General: Normal range of motion.     Cervical back: Normal range of motion and neck supple.     Comments: No midline back tenderness Right-sided lumbar  paraspinal muscle tenderness to palpation Negative straight leg raise bilaterally  Skin:    General: Skin is warm and dry.  Neurological:     General: No focal deficit present.     Mental Status: She is alert and oriented to person, place, and time.     Cranial Nerves: No cranial nerve deficit.     Sensory: No sensory deficit.     Motor: No weakness.     Gait: Gait normal.  Psychiatric:        Mood  and Affect: Mood normal.        Behavior: Behavior normal.     ED Results / Procedures / Treatments   Labs (all labs ordered are listed, but only abnormal results are displayed) Labs Reviewed  URINALYSIS, ROUTINE W REFLEX MICROSCOPIC - Abnormal; Notable for the following components:      Result Value   Color, Urine COLORLESS (*)    All other components within normal limits    EKG None  Radiology CT Lumbar Spine Wo Contrast  Result Date: 09/02/2022 CLINICAL DATA:  Low back pain, increased fracture risk EXAM: CT LUMBAR SPINE WITHOUT CONTRAST TECHNIQUE: Multidetector CT imaging of the lumbar spine was performed without intravenous contrast administration. Multiplanar CT image reconstructions were also generated. RADIATION DOSE REDUCTION: This exam was performed according to the departmental dose-optimization program which includes automated exposure control, adjustment of the mA and/or kV according to patient size and/or use of iterative reconstruction technique. COMPARISON:  CT AP 06/30/22 FINDINGS: Segmentation: 5 lumbar type vertebrae. Alignment: Grade 1 anterolisthesis of L4 on L5. Trace retrolisthesis of L5 on S1. Vertebrae: No acute fracture or focal pathologic process. Paraspinal and other soft tissues: Right-sided ureteral stent in place. There is circumferential bladder wall thickening, which can be seen in the setting cystitis. Recommend correlation with urinalysis. Mild presacral soft stranding, unchanged from prior exam. There is a 1.1 cm right retrocrural lymph node (series 4,  image 15). Redemonstrated is masslike soft tissue along the anterior aspect of the right psoas muscle body. Redemonstrated is nonspecific soft tissue in the prevertebral space, portions of which encase right ureter. Aortic atherosclerotic calcification. Disc levels: No CT evidence of high-grade spinal canal stenosis. Moderate to severe right-sided neural foraminal stenosis at L3-L4 and L4-L5. IMPRESSION: 1. No acute fracture or traumatic listhesis. 2. Redemonstrated retroperitoneal and nodal metastatic disease that is likely unchanged compared to 08/12 4. 3. Circumferential bladder wall thickening, which can be seen in the setting of cystitis. Recommend correlation with urinalysis. Aortic Atherosclerosis (ICD10-I70.0). Electronically Signed   By: Lorenza Cambridge M.D.   On: 09/02/2022 15:16    Procedures Procedures    Medications Ordered in ED Medications  lidocaine (LIDODERM) 5 % 1-3 patch (1 patch Transdermal Patch Applied 09/02/22 1249)  cyclobenzaprine (FLEXERIL) tablet 5 mg (has no administration in time range)  acetaminophen (TYLENOL) tablet 1,000 mg (1,000 mg Oral Given 09/02/22 1246)  HYDROmorphone (DILAUDID) injection 0.5 mg (0.5 mg Intramuscular Given 09/02/22 1247)    ED Course/ Medical Decision Making/ A&P Clinical Course as of 09/02/22 1552  Tue Sep 02, 2022  1532 DC [JD]  1547 Lumbar spine without acute abnormality.  Possible bladder inflammation but UA is negative for UTI.  Her pain initially resolved and she was able to ambulate steadily to the bathroom and had mild pain return on her walk back.  She declined additional pain medication at this time. She is stable for discharge home with outpatient follow-up. [VK]    Clinical Course User Index [JD] Laurence Spates, MD [VK] Rexford Maus, DO                                 Medical Decision Making This patient presents to the ED with chief complaint(s) of back pain with pertinent past medical history of ovarian cancer, HTN,  DM which further complicates the presenting complaint. The complaint involves an extensive differential diagnosis and also carries with it a high risk of  complications and morbidity.    The differential diagnosis includes due to patient's history of cancer though she has had no trauma with concern for possible occult fracture or metastasis, no focal neurologic deficits, no loss of bowel or bladder function making cauda equina unlikely, no urinary symptoms making UTI or pyelonephritis unlikely, negative straight leg raise making sciatica less likely  Additional history obtained: Additional history obtained from family Records reviewed patient oncology records - does have a met to the right psoas  ED Course and Reassessment: Patient's arrival she is hemodynamically stable in no acute distress.  She had urine performed in triage that is pending.  Due to patient's cancer history she will have a lumbar CT to evaluate for mets or fractures cause of her pain and will be given Dilaudid, Tylenol and lidocaine patch.  She has no focal neurologic deficits and no loss of bowel or bladder function and no signs concerning for cauda equina at this time.  Independent labs interpretation:  N/A  Independent visualization of imaging: - I independently visualized the following imaging with scope of interpretation limited to determining acute life threatening conditions related to emergency care: CT lumbar spine, which revealed no acute disease, known metastatic disease  Consultation: - Consulted or discussed management/test interpretation w/ external professional: N/A  Consideration for admission or further workup: Patient has no emergent conditions requiring admission or further work-up at this time and is stable for discharge home with primary care follow-up  Social Determinants of health: N/A    Amount and/or Complexity of Data Reviewed Labs: ordered. Radiology: ordered.  Risk OTC drugs. Prescription  drug management.          Final Clinical Impression(s) / ED Diagnoses Final diagnoses:  Acute right-sided low back pain without sciatica    Rx / DC Orders ED Discharge Orders          Ordered    cyclobenzaprine (FLEXERIL) 10 MG tablet  2 times daily PRN        09/02/22 1544              Rexford Maus, DO 09/02/22 1552

## 2022-09-02 NOTE — Discharge Instructions (Addendum)
You were seen in the emergency department for your back pain.  Your CT scan showed no signs of broken bones or new lesions to your spine.  This is likely due to a muscle strain in your back or could be related to your cancer as you do have cancer nodules and one of the muscles in your abdomen near the right side of your back that could be causing the pain.  You can continue to take Tylenol, Motrin and your home hydromorphone as needed for pain as well as use the lidocaine patches, ice or heat.  I have given you a few Flexeril to take as needed though this can make you drowsy so make sure you are taking it at least 1 to 2 hours apart from the hydromorphone and do not take it before driving, working or operating heavy machinery.  You can follow-up with your primary doctor or your oncologist to have your symptoms rechecked.  You should return to the emergency department if you are unable to walk, you have worsening numbness or weakness in your legs, you are unable to urinate or if you have any other new or concerning symptoms.

## 2022-09-03 ENCOUNTER — Inpatient Hospital Stay: Payer: 59

## 2022-09-04 ENCOUNTER — Ambulatory Visit: Payer: Self-pay | Admitting: Licensed Clinical Social Worker

## 2022-09-04 ENCOUNTER — Telehealth: Payer: Self-pay | Admitting: Internal Medicine

## 2022-09-04 NOTE — Patient Outreach (Signed)
  Care Coordination  Follow Up Visit Note   09/04/2022 Name: Laura [name] MRN: [id number] DOB: [birth year]  Laura [name] is a 77 y.o. year old female who sees Laura Levins, MD for primary care. I spoke with  Laura [name] by phone today.  What matters to the patients health and wellness today?  Navigating her cancer treatment  Collaborated with Laura [name] at the [facility] to connect with patient.   Goals Addressed             This Visit's Progress    COMPLETED: Care Coordination Activiites: personal care services       Activities and task to complete in order to accomplish goals.   continue relaxed breathing 3 times daily Keep all upcoming appointment discussed today Continue with compliance of taking medication prescribed by Doctor Call your [name] if needed [phone] Complete Advance Directive packet,  Have advance directive notarized and provide a copy to provider office   Personal Care Service form faxed by Dr. Jonny [name] office, you informed me your in-home assessment is scheduled for 09/12/22 please call them if you have questions [phone] I have contacted Laura [name], [name] to provide emotional support and assist you with navigating her cancer treatment and concerns        SDOH assessments and interventions completed:  Yes  SDOH Interventions Today    Flowsheet Row Most Recent Value  SDOH Interventions   Health Literacy Interventions Intervention Not Indicated       Care Coordination Interventions:  Yes, provided  Interventions Today    Flowsheet Row Most Recent Value  Chronic Disease   Chronic disease during today's visit Hypertension (HTN), Diabetes, Chronic Kidney Disease/End Stage Renal Disease (ESRD)  General Interventions   General Interventions Discussed/Reviewed General Interventions Reviewed, Communication with  Communication with Social Work, PCP/Specialists  [Cancer Center]  Level of Care Personal Care Services   [confirmed with CMA the PCS form has been faxed to [facility]]  Education Interventions   Education Provided Provided Education  Provided Verbal Education On [employer], Mental Health/Coping with Illness  [facility] Navigator]  Mental Health Interventions   Mental Health Discussed/Reviewed Anxiety, Coping Strategies       Follow up plan:  No further intervention required by this social worker Referral made to Cancer Center Social Worker   Encounter Outcome:  Pt. Visit Completed   Laura Hines, LCSW [place]  [facility], Population Health Licensed Clinical Social Work Chiropodist Dial: [phone]

## 2022-09-04 NOTE — Telephone Encounter (Signed)
Pt went to the ED on August 20th for extreme pain in her lower back. Pt started a new medication ( muscle relaxer) and pt is starting to flinch randomly during the day.Pt wanting to know what is causing it. Please advise  Example: her left leg and shoulder kicks

## 2022-09-04 NOTE — Patient Instructions (Signed)
Social Work Visit Information  Thank you for taking time to visit with me today. Please don't hesitate to contact me if I can be of assistance to you.   Following are the goals we discussed today:   Goals Addressed             This Visit's Progress    COMPLETED: Care Coordination Activiites: personal care services       Activities and task to complete in order to accomplish goals.   continue relaxed breathing 3 times daily Keep all upcoming appointment discussed today Continue with compliance of taking medication prescribed by Doctor Call your [facility] Navigator if needed [phone] Complete Advance Directive packet,  Have advance directive notarized and provide a copy to provider office   Personal Care Service form faxed by Dr. [name] office, you informed me your in-home assessment is scheduled for 09/12/22 please call them if you have questions [phone] I have contacted [name], [name] to provide emotional support and assist you with navigating her cancer treatment and concerns        No follow up scheduled with social work at this time.  Please call the care guide team at [phone] if you need to cancel or reschedule your appointment.   If you or anyone you know are experiencing a Mental Health or Behavioral Health Crisis or need someone to talk to, please call the Suicide and Crisis Lifeline: 988 call the [place] National Suicide Prevention Lifeline: 2018438445 or TTY: 856-281-2385 TTY 313 371 5579) to talk to a trained counselor call 1-800-273-TALK (toll free, 24 hour hotline) go to [facility] [address] [place] [id number])   Patient verbalizes understanding of instructions and care plan provided today and agrees to view in MyChart. Active MyChart status and patient understanding of how to access instructions and care plan via MyChart confirmed with patient.      [name], LCSW [place]   [facility], [facility] Licensed Clinical Social Work Care Coordinator  Direct Dial: [phone]

## 2022-09-04 NOTE — Telephone Encounter (Signed)
Sorry I can't say exactly but I suspect it is related to the original problem, and most likely is not a serious issue to require further eval or tx for now;  please continue to monitor

## 2022-09-05 NOTE — Telephone Encounter (Signed)
Called and let Pt know

## 2022-09-09 ENCOUNTER — Emergency Department (HOSPITAL_BASED_OUTPATIENT_CLINIC_OR_DEPARTMENT_OTHER)
Admission: EM | Admit: 2022-09-09 | Discharge: 2022-09-10 | Disposition: A | Discharge status: Home / Self Care | Payer: 59 | Attending: Emergency Medicine | Admitting: Emergency Medicine

## 2022-09-09 ENCOUNTER — Other Ambulatory Visit: Payer: Self-pay

## 2022-09-09 ENCOUNTER — Other Ambulatory Visit (HOSPITAL_BASED_OUTPATIENT_CLINIC_OR_DEPARTMENT_OTHER): Payer: Self-pay

## 2022-09-09 DIAGNOSIS — M549 Dorsalgia, unspecified: Secondary | ICD-10-CM | POA: Insufficient documentation

## 2022-09-09 DIAGNOSIS — R109 Unspecified abdominal pain: Secondary | ICD-10-CM | POA: Insufficient documentation

## 2022-09-09 DIAGNOSIS — C569 Malignant neoplasm of unspecified ovary: Secondary | ICD-10-CM | POA: Insufficient documentation

## 2022-09-09 DIAGNOSIS — R1031 Right lower quadrant pain: Secondary | ICD-10-CM | POA: Diagnosis not present

## 2022-09-09 DIAGNOSIS — Z8543 Personal history of malignant neoplasm of ovary: Secondary | ICD-10-CM

## 2022-09-09 LAB — COMPREHENSIVE METABOLIC PANEL
ALT: 9 U/L (ref 0–44)
AST: 32 U/L (ref 15–41)
Albumin: 4.3 g/dL (ref 3.5–5.0)
Alkaline Phosphatase: 68 U/L (ref 38–126)
Anion gap: 13 (ref 5–15)
BUN: 23 mg/dL (ref 8–23)
CO2: 21 mmol/L — ABNORMAL LOW (ref 22–32)
Calcium: 9.8 mg/dL (ref 8.9–10.3)
Chloride: 99 mmol/L (ref 98–111)
Creatinine, Ser: 1.15 mg/dL — ABNORMAL HIGH (ref 0.44–1.00)
GFR, Estimated: 49 mL/min — ABNORMAL LOW (ref >60)
Glucose, Bld: 157 mg/dL — ABNORMAL HIGH (ref 70–99)
Potassium: 4.9 mmol/L (ref 3.5–5.1)
Sodium: 133 mmol/L — ABNORMAL LOW (ref 135–145)
Total Bilirubin: 0.3 mg/dL (ref 0.3–1.2)
Total Protein: 7.9 g/dL (ref 6.5–8.1)

## 2022-09-09 LAB — CBC
HCT: 38.3 % (ref 36.0–46.0)
Hemoglobin: 12.7 g/dL (ref 12.0–15.0)
MCH: 29.8 pg (ref 26.0–34.0)
MCHC: 33.2 g/dL (ref 30.0–36.0)
MCV: 89.9 fL (ref 80.0–100.0)
Platelets: 158 10*3/uL (ref 150–400)
RBC: 4.26 MIL/uL (ref 3.87–5.11)
RDW: 16.1 % — ABNORMAL HIGH (ref 11.5–15.5)
WBC: 6.6 10*3/uL (ref 4.0–10.5)
nRBC: 0 % (ref 0.0–0.2)

## 2022-09-09 LAB — LIPASE, BLOOD: Lipase: <10 U/L — ABNORMAL LOW (ref 11–51)

## 2022-09-09 LAB — URINALYSIS, ROUTINE W REFLEX MICROSCOPIC
Bilirubin Urine: NEGATIVE
Glucose, UA: NEGATIVE mg/dL
Hgb urine dipstick: NEGATIVE
Ketones, ur: NEGATIVE mg/dL
Leukocytes,Ua: NEGATIVE
Nitrite: NEGATIVE
Protein, ur: NEGATIVE mg/dL
Specific Gravity, Urine: 1.012 (ref 1.005–1.030)
pH: 7.5 (ref 5.0–8.0)

## 2022-09-09 LAB — TROPONIN I (HIGH SENSITIVITY): Troponin I (High Sensitivity): 3 ng/L (ref <18)

## 2022-09-09 MED ORDER — ACETAMINOPHEN 325 MG PO TABS
650.0000 mg | ORAL_TABLET | Freq: Once | ORAL | Status: AC
  Administered 2022-09-09: 650 mg via ORAL
  Filled 2022-09-09: qty 2

## 2022-09-09 NOTE — ED Notes (Signed)
Patient laying supine with hot packs to lower back for pain relief.

## 2022-09-09 NOTE — ED Triage Notes (Addendum)
Arrives from home. A+Ox4. Cancer patient- ovarian- currently receiving chemo- due tomorrow for next dose.   Complains of continues abd pain. Feels pressure in lower quadrants. Right sided flank pain. Feels in legs and back as well. Seen last week for similar complaints. Was helped by diclofenac cream this morning. No longer helping. Last BM today. No urinary changes.

## 2022-09-10 ENCOUNTER — Inpatient Hospital Stay: Payer: 59

## 2022-09-10 VITALS — BP 136/68 | HR 87 | Temp 97.1°F | Resp 18 | Wt 181.4 lb

## 2022-09-10 DIAGNOSIS — Z95828 Presence of other vascular implants and grafts: Secondary | ICD-10-CM

## 2022-09-10 DIAGNOSIS — R109 Unspecified abdominal pain: Secondary | ICD-10-CM | POA: Diagnosis not present

## 2022-09-10 DIAGNOSIS — C563 Malignant neoplasm of bilateral ovaries: Secondary | ICD-10-CM

## 2022-09-10 LAB — CBC WITH DIFFERENTIAL/PLATELET
Abs Immature Granulocytes: 0.03 10*3/uL (ref 0.00–0.07)
Basophils Absolute: 0 10*3/uL (ref 0.0–0.1)
Basophils Relative: 0 %
Eosinophils Absolute: 0 10*3/uL (ref 0.0–0.5)
Eosinophils Relative: 1 %
HCT: 35.7 % — ABNORMAL LOW (ref 36.0–46.0)
Hemoglobin: 11.7 g/dL — ABNORMAL LOW (ref 12.0–15.0)
Immature Granulocytes: 1 %
Lymphocytes Relative: 22 %
Lymphs Abs: 1.3 10*3/uL (ref 0.7–4.0)
MCH: 29.5 pg (ref 26.0–34.0)
MCHC: 32.8 g/dL (ref 30.0–36.0)
MCV: 90.2 fL (ref 80.0–100.0)
Monocytes Absolute: 0.8 10*3/uL (ref 0.1–1.0)
Monocytes Relative: 13 %
Neutro Abs: 3.8 10*3/uL (ref 1.7–7.7)
Neutrophils Relative %: 63 %
Platelets: 146 10*3/uL — ABNORMAL LOW (ref 150–400)
RBC: 3.96 MIL/uL (ref 3.87–5.11)
RDW: 15.9 % — ABNORMAL HIGH (ref 11.5–15.5)
WBC: 6 10*3/uL (ref 4.0–10.5)
nRBC: 0 % (ref 0.0–0.2)

## 2022-09-10 LAB — MAGNESIUM: Magnesium: 1.8 mg/dL (ref 1.7–2.4)

## 2022-09-10 LAB — COMPREHENSIVE METABOLIC PANEL
ALT: 13 U/L (ref 0–44)
AST: 30 U/L (ref 15–41)
Albumin: 3.8 g/dL (ref 3.5–5.0)
Alkaline Phosphatase: 68 U/L (ref 38–126)
Anion gap: 9 (ref 5–15)
BUN: 24 mg/dL — ABNORMAL HIGH (ref 8–23)
CO2: 22 mmol/L (ref 22–32)
Calcium: 9.5 mg/dL (ref 8.9–10.3)
Chloride: 99 mmol/L (ref 98–111)
Creatinine, Ser: 1.26 mg/dL — ABNORMAL HIGH (ref 0.44–1.00)
GFR, Estimated: 44 mL/min — ABNORMAL LOW (ref >60)
Glucose, Bld: 183 mg/dL — ABNORMAL HIGH (ref 70–99)
Potassium: 4 mmol/L (ref 3.5–5.1)
Sodium: 130 mmol/L — ABNORMAL LOW (ref 135–145)
Total Bilirubin: 0.6 mg/dL (ref 0.3–1.2)
Total Protein: 7.4 g/dL (ref 6.5–8.1)

## 2022-09-10 MED ORDER — SODIUM CHLORIDE 0.9% FLUSH
10.0000 mL | INTRAVENOUS | Status: DC | PRN
  Administered 2022-09-10: 10 mL

## 2022-09-10 MED ORDER — HEPARIN SOD (PORK) LOCK FLUSH 100 UNIT/ML IV SOLN
500.0000 [IU] | Freq: Once | INTRAVENOUS | Status: AC | PRN
  Administered 2022-09-10: 500 [IU]

## 2022-09-10 MED ORDER — ACETAMINOPHEN 325 MG PO TABS
650.0000 mg | ORAL_TABLET | Freq: Once | ORAL | Status: DC
  Filled 2022-09-10: qty 2

## 2022-09-10 MED ORDER — DIPHENHYDRAMINE HCL 25 MG PO CAPS: 50.0000 mg | ORAL_CAPSULE | Freq: Once | ORAL | Status: DC

## 2022-09-10 MED ORDER — HYDROMORPHONE HCL 1 MG/ML IJ SOLN
2.0000 mg | Freq: Once | INTRAMUSCULAR | Status: AC
  Administered 2022-09-10: 2 mg via INTRAMUSCULAR
  Filled 2022-09-10: qty 2

## 2022-09-10 MED ORDER — PALONOSETRON HCL INJECTION 0.25 MG/5ML
0.2500 mg | Freq: Once | INTRAVENOUS | Status: AC
  Administered 2022-09-10: 0.25 mg via INTRAVENOUS
  Filled 2022-09-10: qty 5

## 2022-09-10 MED ORDER — FAM-TRASTUZUMAB DERUXTECAN-NXKI CHEMO 100 MG IV SOLR
5.4000 mg/kg | Freq: Once | INTRAVENOUS | Status: AC
  Administered 2022-09-10: 500 mg via INTRAVENOUS
  Filled 2022-09-10: qty 25

## 2022-09-10 MED ORDER — SODIUM CHLORIDE 0.9 % IV SOLN
10.0000 mg | Freq: Once | INTRAVENOUS | Status: AC
  Administered 2022-09-10: 10 mg via INTRAVENOUS
  Filled 2022-09-10: qty 10

## 2022-09-10 MED ORDER — DEXTROSE 5 % IV SOLN: Freq: Once | INTRAVENOUS | Status: AC

## 2022-09-10 MED ORDER — CETIRIZINE HCL 10 MG/ML IV SOLN
10.0000 mg | Freq: Once | INTRAVENOUS | Status: AC
  Administered 2022-09-10: 10 mg via INTRAVENOUS
  Filled 2022-09-10: qty 1

## 2022-09-10 MED ORDER — SODIUM CHLORIDE 0.9 % IV SOLN
150.0000 mg | Freq: Once | INTRAVENOUS | Status: AC
  Administered 2022-09-10: 150 mg via INTRAVENOUS
  Filled 2022-09-10: qty 150

## 2022-09-10 MED ORDER — HYDROMORPHONE HCL 1 MG/ML IJ SOLN
2.0000 mg | Freq: Once | INTRAMUSCULAR | Status: AC
  Administered 2022-09-10: 2 mg via INTRAVENOUS
  Filled 2022-09-10: qty 2

## 2022-09-10 NOTE — ED Provider Notes (Signed)
[place] EMERGENCY DEPARTMENT AT [facility] Provider Note   CSN: [id number] Arrival date & time: 09/09/22  1920     History  Chief Complaint  Patient presents with   Abdominal Pain    Laura [name] is a 77 y.o. female.  Patient is a 77 year old female with past medical history of ovarian cancer currently undergoing chemotherapy.  Patient presenting today with complaints of pain in her flank and back.  This has been worsening over the past week.  She was seen here 1 week ago and had CT scan and laboratory studies obtained.  She was given pain medication with some relief.  She denies any bowel or bladder complaints.  She denies any fevers or chills.  She tells me she has Dilaudid at home, however has not taken this.  Pain is worse when she attempts to stand and ambulate.  The history is provided by the patient.       Home Medications Prior to Admission medications   Medication Sig Start Date End Date Taking? Authorizing Provider  acetaminophen (TYLENOL) 500 MG tablet Take 1 tablet (500 mg total) by mouth every 6 (six) hours as needed. 05/16/20   Kerrin Champagne, MD  amLODipine (NORVASC) 10 MG tablet Take 1 tablet (10 mg total) by mouth daily. 07/29/22   Doreatha Massed, MD  Bismuth Subsalicylate (PEPTO-BISMOL MAX STRENGTH PO) Take 10 mLs by mouth every 8 (eight) hours as needed (IBS symptoms). 06/13/21   [provider]  Blood Glucose Monitoring Suppl (ONE TOUCH ULTRA 2) w/Device KIT Use as directed 04/06/15   Corwin Levins, MD  Cholecalciferol (VITAMIN D3) 50 MCG (2000 UT) TABS Take 2,000 Units by mouth daily.    [provider]  cyclobenzaprine (FLEXERIL) 10 MG tablet Take 0.5 tablets (5 mg total) by mouth 2 (two) times daily as needed for muscle spasms. 09/02/22   Elayne Snare K, DO  docusate sodium (COLACE) 100 MG capsule Take 2 capsules (200 mg total) by mouth 2 (two) times daily. 07/01/22   Carnella Guadalajara, PA-C  Famotidine (PEPCID AC PO)  Take 1 tablet by mouth daily as needed (indigestion).    [provider]  furosemide (LASIX) 20 MG tablet Take 1 tablet (20 mg total) by mouth daily as needed for edema (leg swelling). 07/01/22   Carnella Guadalajara, PA-C  glimepiride (AMARYL) 2 MG tablet TAKE 1/2 TABLET (1MG ) BY MOUTH ONCE DAILY BEFORE BREAKFAST 08/21/22   Corwin Levins, MD  glucose blood [facility] ULTRA) test strip USE TO CHECK BLOOD SUGARS TWO TIMES DAILY 03/22/21   Corwin Levins, MD  HYDROmorphone (DILAUDID) 4 MG tablet Take by mouth every 4 (four) hours as needed for severe pain (PRN as needed.).    [provider]  lactulose (CHRONULAC) 10 GM/15ML solution Take 30 mLs (20 g total) by mouth at bedtime. 07/01/22   Carnella Guadalajara, PA-C  Lancets [facility] ULTRASOFT) lancets 1 each by Other route as needed for other. Use as instructed 03/20/21   Corwin Levins, MD  lidocaine (LIDODERM) 5 % Place 1 patch onto the skin daily. Remove & Discard patch within 12 hours or as directed by MD 09/01/22   Doreatha Massed, MD  loratadine (CLARITIN) 10 MG tablet Take 10 mg by mouth daily.    [provider]  megestrol (MEGACE) 400 MG/10ML suspension Take 10 mLs (400 mg total) by mouth 2 (two) times daily. 09/01/22   Doreatha Massed, MD  polyethylene glycol (MIRALAX / Ethelene Hal) 17  g packet Take 17 g by mouth daily.    [provider]  prochlorperazine (COMPAZINE) 10 MG tablet Take 1 tablet (10 mg total) by mouth every 6 (six) hours as needed for nausea or vomiting. 07/14/22   Doreatha Massed, MD  solifenacin (VESICARE) 5 MG tablet Take 1 tablet (5 mg total) by mouth daily. 02/14/22   Corwin Levins, MD  traZODone (DESYREL) 100 MG tablet 1/2 - 1 tab by mouth at bedtime for sleep as needed 08/26/22   Corwin Levins, MD  vitamin B-12 (CYANOCOBALAMIN) 1000 MCG tablet Take 1,000 mcg by mouth daily.    [provider]  Wheat Dextrin (BENEFIBER) CHEW Chew 3 tablets by mouth daily.    [provider]      Allergies    Ciprofloxacin, Hydrocodone bit-homatrop mbr, Prednisone, Sulfa antibiotics, Alfuzosin, Codeine, Crestor [rosuvastatin calcium], Doxycycline, Gabapentin, Keflex [cephalexin], Myrbetriq [mirabegron er], Naproxen, and Statins    Review of Systems   Review of Systems  All other systems reviewed and are negative.   Physical Exam Updated Vital Signs BP (!) 148/93   Pulse 95   Temp 98 F (36.7 C)   Resp 18   SpO2 100%  Physical Exam Vitals and nursing note reviewed.  Constitutional:      General: She is not in acute distress.    Appearance: She is well-developed. She is not diaphoretic.  HENT:     Head: Normocephalic and atraumatic.  Cardiovascular:     Rate and Rhythm: Normal rate and regular rhythm.     Heart sounds: No murmur heard.    No friction rub. No gallop.  Pulmonary:     Effort: Pulmonary effort is normal. No respiratory distress.     Breath sounds: Normal breath sounds. No wheezing.  Abdominal:     General: Bowel sounds are normal. There is no distension.     Palpations: Abdomen is soft.     Tenderness: There is no abdominal tenderness.     Comments: There is tenderness to palpation in the bilateral flanks, right greater than left.  Abdomen is nontender and benign.  Musculoskeletal:        General: Normal range of motion.     Cervical back: Normal range of motion and neck supple.  Skin:    General: Skin is warm and dry.  Neurological:     General: No focal deficit present.     Mental Status: She is alert and oriented to person, place, and time.     ED Results / Procedures / Treatments   Labs (all labs ordered are listed, but only abnormal results are displayed) Labs Reviewed  LIPASE, BLOOD - Abnormal; Notable for the following components:      Result Value   Lipase <10 (*)    All other components within normal limits  COMPREHENSIVE METABOLIC PANEL - Abnormal; Notable for the following components:   Sodium 133 (*)    CO2  21 (*)    Glucose, Bld 157 (*)    Creatinine, Ser 1.15 (*)    GFR, Estimated 49 (*)    All other components within normal limits  CBC - Abnormal; Notable for the following components:   RDW 16.1 (*)    All other components within normal limits  URINALYSIS, ROUTINE W REFLEX MICROSCOPIC - Abnormal; Notable for the following components:   Color, Urine COLORLESS (*)    All other components within normal limits  TROPONIN I (HIGH SENSITIVITY)  TROPONIN I (HIGH SENSITIVITY)  EKG None  Radiology No results found.  Procedures Procedures    Medications Ordered in ED Medications  HYDROmorphone (DILAUDID) injection 2 mg (has no administration in time range)  acetaminophen (TYLENOL) tablet 650 mg (650 mg Oral Given 09/09/22 2205)    ED Course/ Medical Decision Making/ A&P  Patient with history of metastatic ovarian cancer presenting with complaints of pain in her back.  She was found to have invasion of the psoas muscle last week and has been imaged several times.  She returns here with ongoing discomfort unrelieved with her home medications.  Laboratory studies obtained in triage including CBC, metabolic panel, and lipase, all of which are unremarkable.  Troponin is negative.  Urinalysis not consistent with UTI.  Patient has received IM Dilaudid and is now resting comfortably.  I feel as though she can safely be discharged with continued use of her home medications.  She has chemotherapy this morning and can follow-up with her oncologist there.  Final Clinical Impression(s) / ED Diagnoses Final diagnoses:  None    Rx / DC Orders ED Discharge Orders     None         Geoffery Lyons, MD 09/10/22 [phone]

## 2022-09-10 NOTE — Progress Notes (Signed)
Pharmacist Chemotherapy Monitoring - Initial Assessment    Anticipated start date: 09/10/22   The following has been reviewed per standard work regarding the patient's treatment regimen: The patient's diagnosis, treatment plan and drug doses, and organ/hematologic function Lab orders and baseline tests specific to treatment regimen  The treatment plan start date, drug sequencing, and pre-medications Prior authorization status  Patient's documented medication list, including drug-drug interaction screen and prescriptions for anti-emetics and supportive care specific to the treatment regimen The drug concentrations, fluid compatibility, administration routes, and timing of the medications to be used The patient's access for treatment and lifetime cumulative dose history, if applicable  The patient's medication allergies and previous infusion related reactions, if applicable   Changes made to treatment plan:  N/A  Follow up needed:  N/A   Stephens Shire, [facility], 09/10/2022  10:38 AM

## 2022-09-10 NOTE — Patient Instructions (Signed)
[facility]  Discharge Instructions: Thank you for choosing [facility] to provide your oncology and hematology care.  If you have a lab appointment with the Cancer Center - please note that after April 8th, 2024, all labs will be drawn in the cancer center.  You do not have to check in or register with the main entrance as you have in the past but will complete your check-in in the cancer center.  Wear comfortable clothing and clothing appropriate for easy access to any Portacath or PICC line.   We strive to give you quality time with your provider. You may need to reschedule your appointment if you arrive late (15 or more minutes).  Arriving late affects you and other patients whose appointments are after yours.  Also, if you miss three or more appointments without notifying the office, you may be dismissed from the clinic at the provider's discretion.      For prescription refill requests, have your pharmacy contact our office and allow 72 hours for refills to be completed.    Today you received the following chemotherapy and/or immunotherapy agents Enhertu      To help prevent nausea and vomiting after your treatment, we encourage you to take your nausea medication as directed.  BELOW ARE SYMPTOMS THAT SHOULD BE REPORTED IMMEDIATELY: *FEVER GREATER THAN 100.4 F (38 C) OR HIGHER *CHILLS OR SWEATING *NAUSEA AND VOMITING THAT IS NOT CONTROLLED WITH YOUR NAUSEA MEDICATION *UNUSUAL SHORTNESS OF BREATH *UNUSUAL BRUISING OR BLEEDING *URINARY PROBLEMS (pain or burning when urinating, or frequent urination) *BOWEL PROBLEMS (unusual diarrhea, constipation, pain near the anus) TENDERNESS IN MOUTH AND THROAT WITH OR WITHOUT PRESENCE OF ULCERS (sore throat, sores in mouth, or a toothache) UNUSUAL RASH, SWELLING OR PAIN  UNUSUAL VAGINAL DISCHARGE OR ITCHING   Items with * indicate a potential emergency and should be followed up as soon as possible or go to the  Emergency Department if any problems should occur.  Please show the CHEMOTHERAPY ALERT CARD or IMMUNOTHERAPY ALERT CARD at check-in to the Emergency Department and triage nurse.  Should you have questions after your visit or need to cancel or reschedule your appointment, please contact [facility] [phone]  and follow the prompts.  Office hours are 8:00 a.m. to 4:30 p.m. Monday - Friday. Please note that voicemails left after 4:00 p.m. may not be returned until the following business day.  We are closed weekends and major holidays. You have access to a nurse at all times for urgent questions. Please call the main number to the clinic [phone] and follow the prompts.  For any non-urgent questions, you may also contact your provider using MyChart. We now offer e-Visits for anyone 35 and older to request care online for non-urgent symptoms. For details visit [email or web address].   Also download the MyChart app! Go to the app store, search "MyChart", open the app, select [place], and log in with your MyChart username and password.

## 2022-09-10 NOTE — Discharge Instructions (Signed)
Continue home medications as previously prescribed.  Follow-up with your oncologist.

## 2022-09-10 NOTE — Progress Notes (Signed)
Labs reviewed, ok to treat per parameters.   Treatment given per orders. Patient tolerated it well without problems. Vitals stable and discharged home from clinic ambulatory. Follow up as scheduled.  

## 2022-09-10 NOTE — Progress Notes (Signed)
Discontinue diphenhydramine from oncology treatment plan --> Add Quzyttir (cetirizine) 10 mg IVPush x 1 as premedication for oncology treatment plan.  T.O. Dr Katragadda/Carol Jones, PharmD  

## 2022-09-10 NOTE — ED Notes (Signed)
Reviewed AVS with patient, patient expressed understanding of directions, denies further questions at this time. 

## 2022-09-11 ENCOUNTER — Telehealth: Payer: Self-pay

## 2022-09-11 ENCOUNTER — Ambulatory Visit: Payer: 59 | Admitting: Internal Medicine

## 2022-09-11 NOTE — Telephone Encounter (Signed)
24 hour follow call-spoke with patient and she stated she is not having any concerns or questions.

## 2022-09-11 NOTE — Telephone Encounter (Signed)
Transition Care Management Follow-up Telephone Call Date of discharge and from where: [facility] 8/20 How have you been since you were released from the hospital? Doing ok but hasn't followed up with PCP but has followed up with the  Any questions or concerns? Yes has concerns about Blood pressure medication   Items Reviewed: Did the pt receive and understand the discharge instructions provided? Yes  Medications obtained and verified? No  Other? No  Any new allergies since your discharge? No  Dietary orders reviewed? No Do you have support at home? Yes     Follow up appointments reviewed:  PCP Hospital f/u appt confirmed? No  Scheduled to see  on  @ . Specialist Hospital f/u appt confirmed? No  Scheduled to see  on  @ . Are transportation arrangements needed? No  If their condition worsens, is the pt aware to call PCP or go to the Emergency Dept.? Yes Was the patient provided with contact information for the PCP's office or ED? Yes Was to pt encouraged to call back with questions or concerns? Yes

## 2022-09-12 NOTE — Progress Notes (Signed)
Patient called wanting to know if she needed to continue taking 10 mg of Amlodipine or go back to the 5 mg tabs, since treatment has changed. Looks like her pressure was on the lower side yesterday but high the day prior.    Message received from Doreatha Massed, MD- If blood pressure is on the lower side, she may take 5 mg.   Patient aware and agreeable with plan. She had some questions about possible side effects of treatment. She had night sweat last night to point of drenching and had to change clothes wants to know what she needs to do. Complained of weakness in right leg last night as well and she fell because her leg gave away.   Response from Dr. Ellin Saba- Night sweat is a possible side effect. It will improve.   Patient aware and agreeable. She will let Dr. Ellin Saba know if this continues and does not improve.

## 2022-09-13 DIAGNOSIS — R531 Weakness: Secondary | ICD-10-CM | POA: Diagnosis not present

## 2022-09-14 ENCOUNTER — Emergency Department (HOSPITAL_COMMUNITY)
Admission: EM | Admit: 2022-09-14 | Discharge: 2022-09-14 | Disposition: A | Discharge status: Home / Self Care | Payer: 59 | Attending: Emergency Medicine | Admitting: Emergency Medicine

## 2022-09-14 ENCOUNTER — Encounter (HOSPITAL_COMMUNITY): Payer: Self-pay

## 2022-09-14 ENCOUNTER — Other Ambulatory Visit: Payer: Self-pay

## 2022-09-14 DIAGNOSIS — Z7984 Long term (current) use of oral hypoglycemic drugs: Secondary | ICD-10-CM | POA: Diagnosis not present

## 2022-09-14 DIAGNOSIS — Z2989 Encounter for other specified prophylactic measures: Secondary | ICD-10-CM

## 2022-09-14 DIAGNOSIS — Z5112 Encounter for antineoplastic immunotherapy: Secondary | ICD-10-CM | POA: Insufficient documentation

## 2022-09-14 DIAGNOSIS — R531 Weakness: Secondary | ICD-10-CM | POA: Diagnosis not present

## 2022-09-14 DIAGNOSIS — R110 Nausea: Secondary | ICD-10-CM | POA: Diagnosis not present

## 2022-09-14 DIAGNOSIS — C569 Malignant neoplasm of unspecified ovary: Secondary | ICD-10-CM | POA: Diagnosis not present

## 2022-09-14 DIAGNOSIS — Z79899 Other long term (current) drug therapy: Secondary | ICD-10-CM | POA: Diagnosis not present

## 2022-09-14 DIAGNOSIS — E119 Type 2 diabetes mellitus without complications: Secondary | ICD-10-CM | POA: Insufficient documentation

## 2022-09-14 DIAGNOSIS — I10 Essential (primary) hypertension: Secondary | ICD-10-CM | POA: Diagnosis not present

## 2022-09-14 DIAGNOSIS — Z743 Need for continuous supervision: Secondary | ICD-10-CM | POA: Diagnosis not present

## 2022-09-14 DIAGNOSIS — R1110 Vomiting, unspecified: Secondary | ICD-10-CM | POA: Diagnosis present

## 2022-09-14 DIAGNOSIS — R5383 Other fatigue: Secondary | ICD-10-CM | POA: Diagnosis not present

## 2022-09-14 LAB — CBC WITH DIFFERENTIAL/PLATELET
Abs Immature Granulocytes: 0.01 10*3/uL (ref 0.00–0.07)
Basophils Absolute: 0 10*3/uL (ref 0.0–0.1)
Basophils Relative: 0 %
Eosinophils Absolute: 0 10*3/uL (ref 0.0–0.5)
Eosinophils Relative: 1 %
HCT: 37.9 % (ref 36.0–46.0)
Hemoglobin: 12.6 g/dL (ref 12.0–15.0)
Immature Granulocytes: 0 %
Lymphocytes Relative: 25 %
Lymphs Abs: 1.3 10*3/uL (ref 0.7–4.0)
MCH: 29.8 pg (ref 26.0–34.0)
MCHC: 33.2 g/dL (ref 30.0–36.0)
MCV: 89.6 fL (ref 80.0–100.0)
Monocytes Absolute: 0.1 10*3/uL (ref 0.1–1.0)
Monocytes Relative: 2 %
Neutro Abs: 3.7 10*3/uL (ref 1.7–7.7)
Neutrophils Relative %: 72 %
Platelets: 140 10*3/uL — ABNORMAL LOW (ref 150–400)
RBC: 4.23 MIL/uL (ref 3.87–5.11)
RDW: 15.4 % (ref 11.5–15.5)
WBC: 5.2 10*3/uL (ref 4.0–10.5)
nRBC: 0 % (ref 0.0–0.2)

## 2022-09-14 LAB — COMPREHENSIVE METABOLIC PANEL
ALT: 14 U/L (ref 0–44)
AST: 30 U/L (ref 15–41)
Albumin: 3.8 g/dL (ref 3.5–5.0)
Alkaline Phosphatase: 67 U/L (ref 38–126)
Anion gap: 12 (ref 5–15)
BUN: 39 mg/dL — ABNORMAL HIGH (ref 8–23)
CO2: 21 mmol/L — ABNORMAL LOW (ref 22–32)
Calcium: 9.7 mg/dL (ref 8.9–10.3)
Chloride: 99 mmol/L (ref 98–111)
Creatinine, Ser: 1.32 mg/dL — ABNORMAL HIGH (ref 0.44–1.00)
GFR, Estimated: 42 mL/min — ABNORMAL LOW (ref >60)
Glucose, Bld: 104 mg/dL — ABNORMAL HIGH (ref 70–99)
Potassium: 3.9 mmol/L (ref 3.5–5.1)
Sodium: 132 mmol/L — ABNORMAL LOW (ref 135–145)
Total Bilirubin: 0.6 mg/dL (ref 0.3–1.2)
Total Protein: 7.3 g/dL (ref 6.5–8.1)

## 2022-09-14 NOTE — ED Provider Notes (Signed)
[place] EMERGENCY DEPARTMENT AT [facility] Provider Note   CSN: [id number] Arrival date & time: 09/14/22  1342     History  Chief Complaint  Patient presents with   Vomiting    Laura [name] is a 77 y.o. female.  Patient followed by [facility].  Patient last seen by them on August 19.  Patient just started new immunotherapy for ovarian adenocarcinoma.  The new immunotherapy is Enhertu.  Patient received the first infusion of this just a few days ago.  At her appointment on August 19 her oncologist decided to go this route for her new therapies.  Patient has had reoccurrence.  CT scan that was done on August 12 found progressive retroperitoneal nodal metastasis and involvement of the right psoas muscle.  Patient's right ureteral stent was in adequate positioning.  Patient's cancer journey began in 2021.  Past medical history significant for diabetes hyperlipidemia hypertension anxiety history of irritable bowel syndrome.  Surgical history significant for gallbladder removal patient had vaginal hysterectomy in her late 30s.  Ureteral stent placed in March 2023.  Redone in April 2024.  Chart review shows the patient's been seen quite frequently in the emergency department since the beginning of June.  Patient lives by herself but they are working on some home resources for her.  Patient's main complaint today is that she had some increased nausea is feeling kind of anxious no significant pain.  No vomiting.  She did take her Compazine shortly prior to arrival and now is feeling much better at least from the nausea standpoint.  Patient's worried about some complicating effect from the new immunotherapy.  No fevers no chest pain no shortness of breath no nausea vomiting or diarrhea.  No rash.       Home Medications Prior to Admission medications   Medication Sig Start Date End Date Taking? Authorizing Provider  acetaminophen (TYLENOL) 500 MG tablet Take 1  tablet (500 mg total) by mouth every 6 (six) hours as needed. 05/16/20   Kerrin Champagne, MD  amLODipine (NORVASC) 10 MG tablet Take 1 tablet (10 mg total) by mouth daily. 07/29/22   Doreatha Massed, MD  Bismuth Subsalicylate (PEPTO-BISMOL MAX STRENGTH PO) Take 10 mLs by mouth every 8 (eight) hours as needed (IBS symptoms). 06/13/21   [provider]  Blood Glucose Monitoring Suppl (ONE TOUCH ULTRA 2) w/Device KIT Use as directed 04/06/15   Corwin Levins, MD  Cholecalciferol (VITAMIN D3) 50 MCG (2000 UT) TABS Take 2,000 Units by mouth daily.    [provider]  cyclobenzaprine (FLEXERIL) 10 MG tablet Take 0.5 tablets (5 mg total) by mouth 2 (two) times daily as needed for muscle spasms. 09/02/22   Elayne Snare K, DO  docusate sodium (COLACE) 100 MG capsule Take 2 capsules (200 mg total) by mouth 2 (two) times daily. 07/01/22   Carnella Guadalajara, PA-C  Famotidine (PEPCID AC PO) Take 1 tablet by mouth daily as needed (indigestion).    [provider]  furosemide (LASIX) 20 MG tablet Take 1 tablet (20 mg total) by mouth daily as needed for edema (leg swelling). 07/01/22   Carnella Guadalajara, PA-C  glimepiride (AMARYL) 2 MG tablet TAKE 1/2 TABLET (1MG ) BY MOUTH ONCE DAILY BEFORE BREAKFAST 08/21/22   Corwin Levins, MD  glucose blood [facility] ULTRA) test strip USE TO CHECK BLOOD SUGARS TWO TIMES DAILY 03/22/21   Corwin Levins, MD  HYDROmorphone (DILAUDID) 4 MG tablet Take by mouth  every 4 (four) hours as needed for severe pain (PRN as needed.).    [provider]  lactulose (CHRONULAC) 10 GM/15ML solution Take 30 mLs (20 g total) by mouth at bedtime. 07/01/22   Carnella Guadalajara, PA-C  Lancets [facility] ULTRASOFT) lancets 1 each by Other route as needed for other. Use as instructed 03/20/21   Corwin Levins, MD  lidocaine (LIDODERM) 5 % Place 1 patch onto the skin daily. Remove & Discard patch within 12 hours or as directed by MD 09/01/22   Doreatha Massed, MD   loratadine (CLARITIN) 10 MG tablet Take 10 mg by mouth daily.    [provider]  megestrol (MEGACE) 400 MG/10ML suspension Take 10 mLs (400 mg total) by mouth 2 (two) times daily. 09/01/22   Doreatha Massed, MD  polyethylene glycol (MIRALAX / GLYCOLAX) 17 g packet Take 17 g by mouth daily.    [provider]  prochlorperazine (COMPAZINE) 10 MG tablet Take 1 tablet (10 mg total) by mouth every 6 (six) hours as needed for nausea or vomiting. 07/14/22   Doreatha Massed, MD  solifenacin (VESICARE) 5 MG tablet Take 1 tablet (5 mg total) by mouth daily. 02/14/22   Corwin Levins, MD  traZODone (DESYREL) 100 MG tablet 1/2 - 1 tab by mouth at bedtime for sleep as needed 08/26/22   Corwin Levins, MD  vitamin B-12 (CYANOCOBALAMIN) 1000 MCG tablet Take 1,000 mcg by mouth daily.    [provider]  Wheat Dextrin (BENEFIBER) CHEW Chew 3 tablets by mouth daily.    [provider]      Allergies    Ciprofloxacin, Hydrocodone bit-homatrop mbr, Prednisone, Sulfa antibiotics, Alfuzosin, Codeine, Crestor [rosuvastatin calcium], Doxycycline, Gabapentin, Keflex [cephalexin], Myrbetriq [mirabegron er], Naproxen, and Statins    Review of Systems   Review of Systems  Constitutional:  Negative for chills and fever.  HENT:  Negative for ear pain and sore throat.   Eyes:  Negative for pain and visual disturbance.  Respiratory:  Negative for cough and shortness of breath.   Cardiovascular:  Negative for chest pain and palpitations.  Gastrointestinal:  Positive for nausea. Negative for abdominal pain and vomiting.  Genitourinary:  Negative for dysuria and hematuria.  Musculoskeletal:  Negative for arthralgias and back pain.  Skin:  Negative for color change and rash.  Neurological:  Negative for seizures and syncope.  Psychiatric/Behavioral:  Positive for agitation.   All other systems reviewed and are negative.   Physical Exam Updated Vital Signs BP 138/70   Pulse 86    Temp 97.9 F (36.6 C) (Oral)   Resp 17   Ht 1.6 m (5\' 3" )   Wt 82.3 kg   SpO2 100%   BMI 32.13 kg/m  Physical Exam Vitals and nursing note reviewed.  Constitutional:      General: She is not in acute distress.    Appearance: Normal appearance. She is well-developed. She is not ill-appearing.  HENT:     Head: Normocephalic and atraumatic.     Mouth/Throat:     Mouth: Mucous membranes are moist.  Eyes:     Conjunctiva/sclera: Conjunctivae normal.  Cardiovascular:     Rate and Rhythm: Normal rate and regular rhythm.     Heart sounds: No murmur heard. Pulmonary:     Effort: Pulmonary effort is normal. No respiratory distress.     Breath sounds: Normal breath sounds.  Abdominal:     Palpations: Abdomen is soft.     Tenderness: There is  no abdominal tenderness.  Musculoskeletal:        General: No swelling.     Cervical back: Normal range of motion and neck supple.  Skin:    General: Skin is warm and dry.     Capillary Refill: Capillary refill takes less than 2 seconds.  Neurological:     General: No focal deficit present.     Mental Status: She is alert and oriented to person, place, and time.  Psychiatric:        Mood and Affect: Mood normal.     ED Results / Procedures / Treatments   Labs (all labs ordered are listed, but only abnormal results are displayed) Labs Reviewed  COMPREHENSIVE METABOLIC PANEL - Abnormal; Notable for the following components:      Result Value   Sodium 132 (*)    CO2 21 (*)    Glucose, Bld 104 (*)    BUN 39 (*)    Creatinine, Ser 1.32 (*)    GFR, Estimated 42 (*)    All other components within normal limits  CBC WITH DIFFERENTIAL/PLATELET - Abnormal; Notable for the following components:   Platelets 140 (*)    All other components within normal limits    EKG None  Radiology No results found.  Procedures Procedures    Medications Ordered in ED Medications - No data to display  ED Course/ Medical Decision Making/ A&P                                  Medical Decision Making Amount and/or Complexity of Data Reviewed Labs: ordered.   Patient nontoxic no acute distress.  Vital signs very reassuring.  No fever.  Blood pressure 139/80 pulse 89 respirations 18 oxygen saturation is 100%.  Patient feeling much better after taking her Compazine that she already has at home.  Patient also has pain medicine at home.  Patient's labs here done just as a precautionary screen look very reassuring sodium 132 potassium 3.9 GFR 42 not significantly off from baseline liver function tests normal.  CBC no leukocytosis white count 5.2 hemoglobin 12.6 and platelets are 140.  The differential without any significant abnormalities.  Long conversation with patient and her family.  Regarding the immunotherapy.  Feel patient is stable for discharge home would recommend her to continue to take her Compazine taking her pain medicine as needed contacting any [facility] on Tuesday just for update on how she is doing with the immunotherapy.  Precautions provided on to return.   Final Clinical Impression(s) / ED Diagnoses Final diagnoses:  Immunotherapy encounter    Rx / DC Orders ED Discharge Orders     None         Vanetta Mulders, MD 09/14/22 1826

## 2022-09-14 NOTE — Discharge Instructions (Signed)
Today's labs very reassuring.  Vital signs very reassuring as well.  Would recommend continue to take your Compazine for any nausea or vomiting.  And also continue to take your pain medicine.  Both of these should help with the feeling of some anxiousness.  Return for any new or worse symptoms.

## 2022-09-14 NOTE — ED Notes (Signed)
BSC placed in pt room. Pt able to transfer to [facility] independently, BSC placed for precaution as pt is a cancer pt and actively receiving chemo.

## 2022-09-14 NOTE — ED Triage Notes (Signed)
Pt had her first round of a new chemo tx for stage 3 ovarian cancer 3 days ago and has been having N/V/D and feeling weak.

## 2022-09-19 ENCOUNTER — Other Ambulatory Visit (HOSPITAL_BASED_OUTPATIENT_CLINIC_OR_DEPARTMENT_OTHER): Payer: Self-pay

## 2022-09-19 ENCOUNTER — Telehealth: Payer: Self-pay

## 2022-09-19 NOTE — Transitions of Care (Post Inpatient/ED Visit) (Signed)
   09/19/2022  Name: Laura [name] MRN: [id number] DOB: 1945-11-20  Today's TOC FU Call Status: Today's TOC FU Call Status:: Unsuccessful Call (1st Attempt) Unsuccessful Call (1st Attempt) Date: 09/19/22  Attempted to reach the patient regarding the most recent Inpatient/ED visit.  Follow Up Plan: Additional outreach attempts will be made to reach the patient to complete the Transitions of Care (Post Inpatient/ED visit) call.     Antionette Fairy, RN,BSN,CCM [facility] Care Management Care Management Community Coordinator Direct Phone: [phone] Toll Free: [phone] Fax: [phone]

## 2022-09-21 ENCOUNTER — Other Ambulatory Visit: Payer: Self-pay

## 2022-09-22 ENCOUNTER — Inpatient Hospital Stay: Payer: 59

## 2022-09-22 ENCOUNTER — Telehealth: Payer: Self-pay

## 2022-09-22 ENCOUNTER — Inpatient Hospital Stay: Payer: 59 | Admitting: Hematology

## 2022-09-22 NOTE — Transitions of Care (Post Inpatient/ED Visit) (Signed)
09/22/2022  Name: Laura [name] MRN: [id number] DOB: [birth year]  Today's TOC FU Call Status: Today's TOC FU Call Status:: Successful TOC FU Call Completed TOC FU Call Complete Date: 09/22/22 Patient's Name and Date of Birth confirmed.   Red on EMMI-ED Discharge Alert Date & Reason:09/16/22 "Scheduled follow-up appt? No"  Transition Care Management Follow-up Telephone Call Date of Discharge: 09/14/22 [facility] Type of Discharge: Emergency Department Reason for ED Visit: Other: ("immunotherapy encounter") How have you been since you were released from the hospital?: Same (Pt states "continues to have side effects from immunotherapy txs. will never take tx again." She was constipated-finally had BM yest-now having some diarrhea-took Imodium this AM. No N&V but decreased appetite. She voices she is weak and tired.) Any questions or concerns?: No  Items Reviewed: Did you receive and understand the discharge instructions provided?: Yes Medications obtained,verified, and reconciled?: No Medications Not Reviewed Reasons:: Other: (pt declined-did not feel up to it) Any new allergies since your discharge?: No Dietary orders reviewed?: Yes Type of Diet Ordered:: low salt/heart healthy Do you have support at home?: Yes People in Home: other relative(s) Name of Support/Comfort Primary Source: pt states her niece is coming to assist her 3x/day, she is supposed to be getting aide services starting on 09/29/22  Medications Reviewed Today: Medications Reviewed Today     Reviewed by Charlyn Minerva, RN (Registered Nurse) on 09/22/22 at [phone]  Med List Status: <None>   Medication Order Taking? Sig Documenting Provider Last Dose Status Informant  acetaminophen (TYLENOL) 500 MG tablet [id number]  Take 1 tablet (500 mg total) by mouth every 6 (six) hours as needed. Kerrin Champagne, MD  Active Self  amLODipine (NORVASC) 10 MG tablet [id number]  Take 1 tablet (10 mg  total) by mouth daily. Doreatha Massed, MD  Active   Bismuth Subsalicylate (PEPTO-BISMOL MAX STRENGTH PO) [id number]  Take 10 mLs by mouth every 8 (eight) hours as needed (IBS symptoms). [provider]  Active Self  Blood Glucose Monitoring Suppl (ONE TOUCH ULTRA 2) w/Device KIT [id number]  Use as directed Corwin Levins, MD  Active Self  Cholecalciferol (VITAMIN D3) 50 MCG (2000 UT) TABS [id number]  Take 2,000 Units by mouth daily. [provider]  Active Self  cyclobenzaprine (FLEXERIL) 10 MG tablet [id number]  Take 0.5 tablets (5 mg total) by mouth 2 (two) times daily as needed for muscle spasms. Elayne Snare K, DO  Active   docusate sodium (COLACE) 100 MG capsule [id number]  Take 2 capsules (200 mg total) by mouth 2 (two) times daily. Rojelio Brenner M, PA-C  Active   Famotidine (PEPCID AC PO) [id number]  Take 1 tablet by mouth daily as needed (indigestion). [provider]  Active Self  furosemide (LASIX) 20 MG tablet [id number]  Take 1 tablet (20 mg total) by mouth daily as needed for edema (leg swelling). Rojelio Brenner M, PA-C  Active   glimepiride (AMARYL) 2 MG tablet [id number]  TAKE 1/2 TABLET (1MG ) BY MOUTH ONCE DAILY BEFORE BREAKFAST Corwin Levins, MD  Active   glucose blood [facility] ULTRA) test strip [id number]  USE TO CHECK BLOOD SUGARS TWO TIMES DAILY Corwin Levins, MD  Active Self  HYDROmorphone (DILAUDID) 4 MG tablet [id number]  Take by mouth every 4 (four) hours as needed for severe pain (PRN as needed.). [provider]  Active   lactulose (CHRONULAC) 10 GM/15ML solution [id number]  Take 30 mLs (20 g total) by mouth at bedtime. Buford Dresser,  Rushie Goltz, PA-C  Active   Lancets [facility] ULTRASOFT) lancets [id number]  1 each by Other route as needed for other. Use as instructed Corwin Levins, MD  Active Self  lidocaine (LIDODERM) 5 % [id number]  Place 1 patch onto the skin daily. Remove & Discard patch within 12 hours or as directed by MD  Doreatha Massed, MD  Active   loratadine (CLARITIN) 10 MG tablet [id number]  Take 10 mg by mouth daily. [provider]  Active Self  megestrol (MEGACE) 400 MG/10ML suspension [id number]  Take 10 mLs (400 mg total) by mouth 2 (two) times daily. Doreatha Massed, MD  Active   polyethylene glycol (MIRALAX / GLYCOLAX) 17 g packet [id number]  Take 17 g by mouth daily. [provider]  Active   prochlorperazine (COMPAZINE) 10 MG tablet [id number] Yes Take 1 tablet (10 mg total) by mouth every 6 (six) hours as needed for nausea or vomiting. Doreatha Massed, MD Taking Active   solifenacin (VESICARE) 5 MG tablet [id number]  Take 1 tablet (5 mg total) by mouth daily. Corwin Levins, MD  Active Self           Med Note Jari Favre Mar 24, 2022  3:43 PM) Reports takes as needed  traZODone (DESYREL) 100 MG tablet [id number] Yes 1/2 - 1 tab by mouth at bedtime for sleep as needed Corwin Levins, MD Taking Active   vitamin B-12 (CYANOCOBALAMIN) 1000 MCG tablet [id number]  Take 1,000 mcg by mouth daily. [provider]  Active Self  Wheat Dextrin [facility]) CHEW [id number]  Chew 3 tablets by mouth daily. [provider]  Active Self            Home Care and Equipment/Supplies: Were Home Health Services Ordered?: NA Any new equipment or medical supplies ordered?: NA  Functional Questionnaire: Do you need assistance with bathing/showering or dressing?: Yes Do you need assistance with meal preparation?: Yes Do you need assistance with eating?: No Do you have difficulty maintaining continence: No Do you need assistance with getting out of bed/getting out of a chair/moving?: No Do you have difficulty managing or taking your medications?: No  Follow up appointments reviewed: PCP Follow-up appointment confirmed?: No (Strongly encouraged pt to make an appt to assessed by a provider-she declined-statses she will wait) MD Provider Line Number:[phone]  Given: No Specialist Hospital Follow-up appointment confirmed?: Yes Date of Specialist follow-up appointment?: 09/30/22 Follow-Up Specialty Provider:: Dr. Wendee Beavers Do you need transportation to your follow-up appointment?: No Do you understand care options if your condition(s) worsen?: Yes-patient verbalized understanding   TOC Interventions Today    Flowsheet Row Most Recent Value  TOC Interventions   TOC Interventions Discussed/Reviewed TOC Interventions Discussed      Interventions Today    Flowsheet Row Most Recent Value  General Interventions   General Interventions Discussed/Reviewed General Interventions Discussed, Doctor Visits, Referral to Nurse  [declined]  Doctor Visits Discussed/Reviewed Doctor Visits Discussed, PCP, Specialist  Education Interventions   Education Provided Provided Education  Provided Verbal Education On When to see the doctor, Medication, Other, Nutrition  [sx mgmt]  Nutrition Interventions   Nutrition Discussed/Reviewed Nutrition Discussed, Fluid intake  Pharmacy Interventions   Pharmacy Dicussed/Reviewed Pharmacy Topics Discussed, Medications and their functions  Safety Interventions   Safety Discussed/Reviewed Safety Discussed, Home Safety  Home Safety Assistive Devices        [place], [place] [facility] Care Management Care Management Community Coordinator Direct Phone: [phone] Toll Free: [phone] Fax:  [phone]  

## 2022-09-28 ENCOUNTER — Other Ambulatory Visit: Payer: Self-pay | Admitting: Physician Assistant

## 2022-09-28 DIAGNOSIS — C801 Malignant (primary) neoplasm, unspecified: Secondary | ICD-10-CM

## 2022-09-29 ENCOUNTER — Telehealth: Payer: Self-pay | Admitting: Internal Medicine

## 2022-09-29 ENCOUNTER — Other Ambulatory Visit: Payer: Self-pay | Admitting: *Deleted

## 2022-09-29 ENCOUNTER — Encounter: Payer: Self-pay | Admitting: *Deleted

## 2022-09-29 DIAGNOSIS — C563 Malignant neoplasm of bilateral ovaries: Secondary | ICD-10-CM

## 2022-09-29 DIAGNOSIS — C569 Malignant neoplasm of unspecified ovary: Secondary | ICD-10-CM

## 2022-09-29 NOTE — Telephone Encounter (Signed)
[facility] called to inform that they have received a palliative care referral for patient and will be following the patient for the palliative care program.

## 2022-09-29 NOTE — Progress Notes (Signed)
Patient called to cancel all future appointments, as she has decided to withdrawal all treatment options.  She has been unable to tolerate and wishes to pursue a natural death process.  Palliative referral with possible transition to Hospice made, with patient's permission to [facility].  Will make Dr. Ellin Saba aware of patient's decision.

## 2022-09-30 ENCOUNTER — Inpatient Hospital Stay: Payer: 59

## 2022-09-30 ENCOUNTER — Inpatient Hospital Stay: Payer: 59 | Admitting: Hematology

## 2022-09-30 ENCOUNTER — Telehealth: Payer: Self-pay

## 2022-09-30 NOTE — Patient Instructions (Signed)
Visit Information  Thank you for taking time to visit with me today. Please don't hesitate to contact me if I can be of assistance to you.   Following are the goals we discussed today:  Continue to take medications as prescribed. Continue to attend provider visits as scheduled Continue to eat healthy, lean meats, vegetables, fruits, avoid saturated and transfats. Eat frequent small meals if needed Contact your provider with any health questions and concerns with your provider Continue to keep track of your blood sugar and notify provider if outside recommended range.   Our next appointment is by telephone on 10/14/22 at 11:00 am  Please call the care guide team at [phone] if you need to cancel or reschedule your appointment.   If you are experiencing a Mental Health or Behavioral Health Crisis or need someone to talk to, please call the Suicide and Crisis Lifeline: 988 call the [place] National Suicide Prevention Lifeline: 270-156-5239 or TTY: (463)886-6727 TTY (301) 758-4975) to talk to a trained counselor call 1-800-273-TALK (toll free, 24 hour hotline)  [name], RN, MSN, BSN, CCM Care Management Coordinator [phone]

## 2022-09-30 NOTE — Patient Outreach (Signed)
Care Coordination   Care care coordination  Visit Note   09/30/2022 Name: Laura [name] MRN: [id number] DOB: Aug 06, 1945  Laura [name] is a 77 y.o. year old female who sees Corwin Levins, MD for primary care. I spoke with  Laura [name] by phone today.  What matters to the patients health and wellness today? RNCM received message from patient today requesting a return call. RNCM returned call to Laura [name] who reports, "I feel better than I did when I called you...and my nerves is shot".  She expresses that she has decided not to take any more chemotherapy, adding she did not know who to call to talk to. Laura [name] is receptive to talking with LCSW regarding coping skills/anxiety and resource needs if needed.  Goals Addressed             This Visit's Progress    care coordination activities       Interventions Today    Flowsheet Row Most Recent Value  Chronic Disease   Chronic disease during today's visit Diabetes, Other  [malignant neoplasm both ovaries]  General Interventions   General Interventions Discussed/Reviewed General Interventions Discussed, Durable Medical Equipment (DME), Doctor Visits  Doctor Visits Discussed/Reviewed Doctor Visits Discussed  Durable Medical Equipment (DME) Dan Humphreys, Glucomoter  Education Interventions   Provided Verbal Education On Blood Sugar Monitoring, Medication, When to see the doctor  Maurilio Lovely to attend provider visit as scheduled, take medications, contact provider with questions/concerns. discuss any questions/concerns regarding oncology treatment with oncologist.]  Mental Health Interventions   Mental Health Discussed/Reviewed Mental Health Discussed, Anxiety, Coping Strategies, Refer to Social Work for counseling, Refer to Social Work for resources  [employer] and support]  Refer to Social Work for counseling regarding Anxiety/Coping  Nutrition Interventions   Nutrition Discussed/Reviewed Nutrition Discussed  Pharmacy Interventions    Pharmacy Dicussed/Reviewed Pharmacy Topics Discussed  [medication review completed]  Safety Interventions   Safety Discussed/Reviewed Safety Discussed, Fall Risk  [fall prevention strategies discussed]            SDOH assessments and interventions completed:  No  SDOH Interventions Today    Flowsheet Row Most Recent Value  SDOH Interventions   Food Insecurity Interventions Intervention Not Indicated  Housing Interventions Intervention Not Indicated  Transportation Interventions Intervention Not Indicated  Utilities Interventions Intervention Not Indicated     Care Coordination Interventions:  Yes, provided   Follow up plan: Follow up call scheduled for 10/14/22    Encounter Outcome:  Patient Visit Completed   [name], RN, MSN, BSN, CCM Care Management Coordinator [phone]

## 2022-10-01 ENCOUNTER — Ambulatory Visit: Payer: Self-pay | Admitting: Licensed Clinical Social Worker

## 2022-10-02 NOTE — Patient Outreach (Signed)
Care Coordination   Initial Visit Note   10/01/2022 Name: Laura [name] MRN: [id number] DOB: 11-Feb-1945  Laura [name] is a 77 y.o. year old female who sees Laura Levins, MD for primary care. I spoke with  Laura [name] by phone today.  What matters to the patients health and wellness today?  Symptom Management    Goals Addressed             This Visit's Progress    Management of MH Symptoms   On track    Activities and task to complete in order to accomplish goals.   Keep all upcoming appointments discussed today Continue with compliance of taking medication prescribed by Doctor Implement healthy coping skills discussed to assist with management of symptoms Continue working with Laura [name] care team to assist with goals identified         SDOH assessments and interventions completed:  No     Care Coordination Interventions:  Yes, provided   Follow up plan: Follow up call scheduled for 1-2 weeks    Encounter Outcome:  Patient Visit Completed   Laura [name], MSW, LCSW [facility] Care Management [facility]  [facility] [email or web address] Phone [phone] 3:31 PM

## 2022-10-02 NOTE — Patient Instructions (Addendum)
Visit Information  Thank you for taking time to visit with me today. Please don't hesitate to contact me if I can be of assistance to you.   Following are the goals we discussed today:   Goals Addressed             This Visit's Progress    Management of MH Symptoms   On track    Activities and task to complete in order to accomplish goals.   Keep all upcoming appointments discussed today Continue with compliance of taking medication prescribed by Doctor Implement healthy coping skills discussed to assist with management of symptoms Continue working with [facility] care team to assist with goals identified         Our next appointment is by telephone on 09/23 at 11:30  Please call the care guide team at [phone] if you need to cancel or reschedule your appointment.   If you are experiencing a Mental Health or Behavioral Health Crisis or need someone to talk to, please call the Suicide and Crisis Lifeline: 988 call 911   Patient verbalizes understanding of instructions and care plan provided today and agrees to view in MyChart. Active MyChart status and patient understanding of how to access instructions and care plan via MyChart confirmed with patient.     Jenel Lucks, MSW, LCSW [facility] Care Management [place]  [facility] [email or web address] Phone [phone] 3:32 PM

## 2022-10-06 ENCOUNTER — Ambulatory Visit: Payer: Self-pay | Admitting: Licensed Clinical Social Worker

## 2022-10-07 NOTE — Patient Instructions (Signed)
Visit Information  Thank you for taking time to visit with me today. Please don't hesitate to contact me if I can be of assistance to you.   Following are the goals we discussed today:   Goals Addressed             This Visit's Progress    Management of MH Symptoms   On track    Activities and task to complete in order to accomplish goals.   Keep all upcoming appointments discussed today Continue with compliance of taking medication prescribed by Doctor Implement healthy coping skills discussed to assist with management of symptoms Continue working with [facility] care team to assist with goals identified Submit Personal Care Services Form to PCP for aid services         Our next appointment is by telephone on 10/07 at 11:30 AM  Please call the care guide team at [phone] if you need to cancel or reschedule your appointment.   If you are experiencing a Mental Health or Behavioral Health Crisis or need someone to talk to, please call the Suicide and Crisis Lifeline: 988 call 911   Patient verbalizes understanding of instructions and care plan provided today and agrees to view in MyChart. Active MyChart status and patient understanding of how to access instructions and care plan via MyChart confirmed with patient.     Jenel Lucks, MSW, LCSW [facility] Care Management [place]  [facility] [email or web address] Phone [phone] 10:42 AM

## 2022-10-07 NOTE — Patient Outreach (Signed)
Care Coordination   Follow Up Visit Note   10/06/2022 Name: Laura [name] MRN: [id number] DOB: [birth year]  Laura [name] is a 77 y.o. year old female who sees Corwin Levins, MD for primary care. I spoke with  Laura [name] by phone today.  What matters to the patients health and wellness today?  Symptom Management    Goals Addressed             This Visit's Progress    Management of MH Symptoms   On track    Activities and task to complete in order to accomplish goals.   Keep all upcoming appointments discussed today Continue with compliance of taking medication prescribed by Doctor Implement healthy coping skills discussed to assist with management of symptoms Continue working with [facility] care team to assist with goals identified Submit Personal Care Services Form to PCP for aid services         SDOH assessments and interventions completed:  No     Care Coordination Interventions:  Yes, provided  Interventions Today    Flowsheet Row Most Recent Value  Chronic Disease   Chronic disease during today's visit Diabetes, Hypertension (HTN), Chronic Kidney Disease/End Stage Renal Disease (ESRD)  [Ovarian Cancer and Anxiety]  General Interventions   General Interventions Discussed/Reviewed General Interventions Reviewed, Level of Care, Doctor Visits  [Patient obtained PCS information from DSS. Pt prefers to bring forms to PCP office for completion]  Doctor Visits Discussed/Reviewed Doctor Visits Reviewed  Laura [name] has not heard back from Palliative Care referral. Will wait for a few more days prior to f/up with Oncology office]  Level of Care Personal Care Services  Mental Health Interventions   Mental Health Discussed/Reviewed Mental Health Reviewed, Coping Strategies, Anxiety, Depression  [LCSW assisted pt with activity to identify progress and encouraged continued gratitude thinking to promote well-being/positive mood]  Nutrition Interventions   Nutrition Discussed/Reviewed  Nutrition Reviewed, Supplemental nutrition  [Patient is drinking boost, drinking sweetened tea to promote energy. Can tolerate chicken noodle soup]  Pharmacy Interventions   Pharmacy Dicussed/Reviewed Pharmacy Topics Reviewed, Medication Adherence  Safety Interventions   Safety Discussed/Reviewed Safety Reviewed       Follow up plan: Follow up call scheduled for 10/20/22    Encounter Outcome:  Patient Visit Completed   Jenel Lucks, MSW, LCSW [facility] Care Management [facility]  [facility] [email or web address] Phone [phone] 10:42 AM

## 2022-10-08 ENCOUNTER — Encounter (HOSPITAL_COMMUNITY): Payer: Self-pay | Admitting: Hematology

## 2022-10-08 ENCOUNTER — Telehealth: Payer: Self-pay | Admitting: Internal Medicine

## 2022-10-08 ENCOUNTER — Encounter: Payer: Self-pay | Admitting: Hematology

## 2022-10-08 NOTE — Telephone Encounter (Signed)
Patient dropped off document Personal Care Services, to be filled out by provider. Patient requested to send it back via Fax within 7-days. Document is located in providers tray at front office.Please advise at Mobile [phone] (mobile)

## 2022-10-10 ENCOUNTER — Encounter: Payer: Self-pay | Admitting: Internal Medicine

## 2022-10-10 DIAGNOSIS — C569 Malignant neoplasm of unspecified ovary: Secondary | ICD-10-CM | POA: Insufficient documentation

## 2022-10-10 NOTE — Telephone Encounter (Signed)
Placed on providers desk

## 2022-10-10 NOTE — Progress Notes (Signed)
Patient called stating that she felt like she had gotten no help since her last treatment. I discussed note made my RN, Cynda Acres with referral to [employer]. Patient states that she has not heard from anyone. She inquires about future scans and port flushes, but tells me that she does not want any future treatment of any sort. We discuss the role of [facility], taking over her care and regularly visiting her in the home based on what she needs. Patient agreeable to referral. Referral placed and patient will be contacted today.

## 2022-10-13 ENCOUNTER — Ambulatory Visit: Payer: 59

## 2022-10-13 ENCOUNTER — Other Ambulatory Visit (HOSPITAL_BASED_OUTPATIENT_CLINIC_OR_DEPARTMENT_OTHER): Payer: Self-pay

## 2022-10-13 ENCOUNTER — Ambulatory Visit: Payer: 59 | Admitting: Hematology

## 2022-10-13 ENCOUNTER — Other Ambulatory Visit: Payer: 59

## 2022-10-13 DIAGNOSIS — N130 Hydronephrosis with ureteropelvic junction obstruction: Secondary | ICD-10-CM | POA: Diagnosis not present

## 2022-10-13 DIAGNOSIS — N201 Calculus of ureter: Secondary | ICD-10-CM | POA: Diagnosis not present

## 2022-10-13 MED ORDER — SOLIFENACIN SUCCINATE 5 MG PO TABS
5.0000 mg | ORAL_TABLET | Freq: Every day | ORAL | 1 refills | Status: AC
  Filled 2022-10-13: qty 90, 90d supply, fill #0

## 2022-10-14 ENCOUNTER — Ambulatory Visit: Payer: Self-pay

## 2022-10-14 NOTE — Patient Instructions (Addendum)
Visit Information  Thank you for taking time to visit with me today. Please don't hesitate to contact me if I can be of assistance to you.   Following are the goals we discussed today:  Continue to take medications as prescribed. Continue to attend provider visits as scheduled Continue to eat healthy, lean meats, vegetables, fruits, avoid saturated and transfats, drink nutritional supplements as needed Continue to check blood sugar as recommended and notify provider if questions or concerns   Our next appointment is by telephone on 11/10/22 at 11:00 am  Please call the care guide team at [phone] if you need to cancel or reschedule your appointment.   If you are experiencing a Mental Health or Behavioral Health Crisis or need someone to talk to, please call the Suicide and Crisis Lifeline: 988 call the [place] National Suicide Prevention Lifeline: 434-129-2597 or TTY: (985)123-5922 TTY (212) 354-0195) to talk to a trained counselor call 1-800-273-TALK (toll free, 24 hour hotline)  [name], RN, MSN, BSN, CCM Care Management Coordinator [phone]

## 2022-10-14 NOTE — Patient Outreach (Signed)
Care Coordination   Follow Up Visit Note   10/14/2022 Name: Laura [name] MRN: [id number] DOB: 05/07/1945  Laura [name] is a 77 y.o. year old female who sees Corwin Levins, MD for primary care. I spoke with  Laura [name] by phone today.  What matters to the patients health and wellness today?  I am feeling better physically and mentally than last week, "I am  doing pretty good this week". She reports she is scheduled for Palliative care visit on 10/23/22. Per patient needs to have a stent replace in ureter and is awaiting a call from urology to schedule. Per patient, Primary provider is faxing Forms to be assessed for a personal care service. Laura [name] with no questions or concerns today.  Goals Addressed             This Visit's Progress    care coordination activities       Interventions Today    Flowsheet Row Most Recent Value  Chronic Disease   Chronic disease during today's visit Other, Hypertension (HTN)  [ovarian cancer]  General Interventions   General Interventions Discussed/Reviewed General Interventions Reviewed  Doctor Visits Discussed/Reviewed Doctor Visits Reviewed, PCP  PCP/Specialist Visits Compliance with follow-up visit  [discussed upcoming follow up/scheduled appointments]  Exercise Interventions   Exercise Discussed/Reviewed Physical Activity  [encouraged to remain as active as tolerated]  Education Interventions   Education Provided Provided Education  [advised to contact provider with health questions or concerns,  continue to take medications as prescribed,  attend provider visits as recommended]  Mental Health Interventions   Mental Health Discussed/Reviewed Mental Health Reviewed  [assess patient status re: anxiety/coping-confirmed patient actively working with LCSW.]  Nutrition Interventions   Nutrition Discussed/Reviewed Nutrition Reviewed  Advanced Directive Interventions   End of Life Palliative  [confirmed patient has been in contact with  Palliative care-per patient scheduled visit on 10/23/22]            SDOH assessments and interventions completed:  No  Care Coordination Interventions:  Yes, provided   Follow up plan: Follow up call scheduled for 11/10/22    Encounter Outcome:  Patient Visit Completed   [name], RN, MSN, BSN, CCM Care Management Coordinator [phone]

## 2022-10-15 ENCOUNTER — Other Ambulatory Visit: Payer: Self-pay

## 2022-10-15 ENCOUNTER — Other Ambulatory Visit: Payer: Self-pay | Admitting: Urology

## 2022-10-15 NOTE — Telephone Encounter (Signed)
Forms faxed

## 2022-10-16 ENCOUNTER — Other Ambulatory Visit: Payer: Self-pay

## 2022-10-16 ENCOUNTER — Other Ambulatory Visit (HOSPITAL_BASED_OUTPATIENT_CLINIC_OR_DEPARTMENT_OTHER): Payer: Self-pay

## 2022-10-16 ENCOUNTER — Encounter (HOSPITAL_COMMUNITY): Payer: Self-pay

## 2022-10-16 ENCOUNTER — Encounter (HOSPITAL_COMMUNITY)
Admission: RE | Admit: 2022-10-16 | Discharge: 2022-10-16 | Disposition: A | Discharge status: Home / Self Care | Payer: 59 | Source: Ambulatory Visit | Attending: Urology | Admitting: Urology

## 2022-10-16 HISTORY — DX: Dyspnea, unspecified: R06.00

## 2022-10-16 HISTORY — DX: Peripheral vascular disease, unspecified: I73.9

## 2022-10-16 MED ORDER — CEPHALEXIN 500 MG PO CAPS
500.0000 mg | ORAL_CAPSULE | Freq: Two times a day (BID) | ORAL | 0 refills | Status: DC
  Filled 2022-10-16: qty 6, 3d supply, fill #0

## 2022-10-16 NOTE — Patient Instructions (Signed)
SURGICAL WAITING ROOM VISITATION  Patients having surgery or a procedure may have no more than 2 support people in the waiting area - these visitors may rotate.    Children under the age of 55 must have an adult with them who is not the patient.  Due to an increase in RSV and influenza rates and associated hospitalizations, children ages 56 and under may not visit patients in [facility] hospitals.  If the patient needs to stay at the hospital during part of their recovery, the visitor guidelines for inpatient rooms apply. Pre-op nurse will coordinate an appropriate time for 1 support person to accompany patient in pre-op.  This support person may not rotate.    Please refer to the [facility] website for the visitor guidelines for Inpatients (after your surgery is over and you are in a regular room).       Your procedure is scheduled on:  10/21/2022    Report to [facility] Main Entrance    Report to admitting at    1045AM   Call this number if you have problems the morning of surgery [phone]   Do not eat food  or drink liquids :After Midnight.                 If you have questions, please contact your surgeon's office.      Oral Hygiene is also important to reduce your risk of infection.                                    Remember - BRUSH YOUR TEETH THE MORNING OF SURGERY WITH YOUR REGULAR TOOTHPASTE  DENTURES WILL BE REMOVED PRIOR TO SURGERY PLEASE DO NOT APPLY "Poly grip" OR ADHESIVES!!!   Do NOT smoke after Midnight   Stop all vitamins and herbal supplements 7 days before surgery.   Take these medicines the morning of surgery with A SIP OF WATER:  amlodipine, claritin, vesicare           Amaryl-   DO NOT TAKE ANY ORAL DIABETIC MEDICATIONS DAY OF YOUR SURGERY  Bring CPAP mask and tubing day of surgery.                              You may not have any metal on your body including hair pins, jewelry, and body piercing             Do not wear make-up,  lotions, powders, perfumes/cologne, or deodorant  Do not wear nail polish including gel and S&S, artificial/acrylic nails, or any other type of covering on natural nails including finger and toenails. If you have artificial nails, gel coating, etc. that needs to be removed by a nail salon please have this removed prior to surgery or surgery may need to be canceled/ delayed if the surgeon/ anesthesia feels like they are unable to be safely monitored.   Do not shave  48 hours prior to surgery.               Men may shave face and neck.   Do not bring valuables to the hospital. [facility] IS NOT             RESPONSIBLE   FOR VALUABLES.   Contacts, glasses, dentures or bridgework may not be worn into surgery.   Bring small overnight bag day of  surgery.   DO NOT BRING YOUR HOME MEDICATIONS TO THE HOSPITAL. PHARMACY WILL DISPENSE MEDICATIONS LISTED ON YOUR MEDICATION LIST TO YOU DURING YOUR ADMISSION IN THE HOSPITAL!    Patients discharged on the day of surgery will not be allowed to drive home.  Someone NEEDS to stay with you for the first 24 hours after anesthesia.   Special Instructions: Bring a copy of your healthcare power of attorney and living will documents the day of surgery if you haven't scanned them before.              Please read over the following fact sheets you were given: IF YOU HAVE QUESTIONS ABOUT YOUR PRE-OP INSTRUCTIONS PLEASE CALL [phone]   If you received a COVID test during your pre-op visit  it is requested that you wear a mask when out in public, stay away from anyone that may not be feeling well and notify your surgeon if you develop symptoms. If you test positive for Covid or have been in contact with anyone that has tested positive in the last 10 days please notify you surgeon.    [place] - Preparing for Surgery Before surgery, you can play an important role.  Because skin is not sterile, your skin needs to be as free of germs as possible.  You can reduce  the number of germs on your skin by washing with CHG (chlorahexidine gluconate) soap before surgery.  CHG is an antiseptic cleaner which kills germs and bonds with the skin to continue killing germs even after washing. Please DO NOT use if you have an allergy to CHG or antibacterial soaps.  If your skin becomes reddened/irritated stop using the CHG and inform your nurse when you arrive at Short Stay. Do not shave (including legs and underarms) for at least 48 hours prior to the first CHG shower.  You may shave your face/neck. Please follow these instructions carefully:  1.  Shower with CHG Soap the night before surgery and the  morning of Surgery.  2.  If you choose to wash your hair, wash your hair first as usual with your  normal  shampoo.  3.  After you shampoo, rinse your hair and body thoroughly to remove the  shampoo.                           4.  Use CHG as you would any other liquid soap.  You can apply chg directly  to the skin and wash                       Gently with a scrungie or clean washcloth.  5.  Apply the CHG Soap to your body ONLY FROM THE NECK DOWN.   Do not use on face/ open                           Wound or open sores. Avoid contact with eyes, ears mouth and genitals (private parts).                       Wash face,  Genitals (private parts) with your normal soap.             6.  Wash thoroughly, paying special attention to the area where your surgery  will be performed.  7.  Thoroughly rinse your body with warm water from the neck  down.  8.  DO NOT shower/wash with your normal soap after using and rinsing off  the CHG Soap.                9.  Pat yourself dry with a clean towel.            10.  Wear clean pajamas.            11.  Place clean sheets on your bed the night of your first shower and do not  sleep with pets. Day of Surgery : Do not apply any lotions/deodorants the morning of surgery.  Please wear clean clothes to the hospital/surgery center.  FAILURE TO FOLLOW  THESE INSTRUCTIONS MAY RESULT IN THE CANCELLATION OF YOUR SURGERY PATIENT SIGNATURE_________________________________  NURSE SIGNATURE__________________________________  ________________________________________________________________________

## 2022-10-16 NOTE — Progress Notes (Addendum)
Anesthesia Review:  PCP: Oliver Barre LOV 08/26/22  Cardiologist : none  Chest x-ray : 08/15/22  EKG :09/09/22  Echo : 08/09/21  Stress test: Cardiac Cath :  Activity level:  Sleep Study/ CPAP  sleep apnea has not used cpap in several years  Fasting Blood Sugar :      / Checks Blood Sugar -- times a day:   Blood Thinner/ Instructions /Last Dose: ASA / Instructions/ Last Dose :    DM- type 2 does not check glucose at home  Amaryl- none day of surgery  Hgba1c- DOS    PT had chemo 09/12/22.  Pt states after that she was wiped out.  PT has been improving at home since staying by herself.  PT has had no labs done since 09/14/22 except  for seeing DR Pace on 10/08/22 and having u/a done.  Per pt.  Pt  has  had no blood in urine per pt.  Not further health issues except for regaining strength. Completed med hx and preop instructions via phone on 10/16/2022.    PT voiced understanding. Leticia Clas aware of pt status.  Pt can have labs done day of surgery..  PT made aware and voiced  Called pt back on 10/16/22 and informed pt to call Admitting on 10/17/22 in the am to review insurance etc with them.  PT given phone number of [phone] .  PT voiced understanding.     PT called and [facility] over weekend of 10/17/2022 and started she was unable to get antibiotic she was supposed to take.  Preop nurse called pt on 10/20/2022 after receiving her message and pt stated she started Cephalexin 500mg  on 10/18/2022 and will complete on 10/7/2024pm.  Placed in meds.

## 2022-10-17 ENCOUNTER — Inpatient Hospital Stay (HOSPITAL_COMMUNITY): Admission: RE | Admit: 2022-10-17 | Payer: 59 | Source: Ambulatory Visit

## 2022-10-20 ENCOUNTER — Ambulatory Visit: Payer: Self-pay | Admitting: Licensed Clinical Social Worker

## 2022-10-20 NOTE — H&P (Signed)
CC/HPI: cc: hydronephrosis    04/02/2022: 77 year old woman with a history of stage IIIc ovarian cancer and retroperitoneal mass causing obstruction of right ureter currently managed with right indwelling stent. She last underwent a right ureteral stent exchange 03/18/2022. She has been experiencing intermittent right lower quadrant pain. She is also had bilateral lower extremity edema and was seen in the ER to rule out DVTs. She sees her PCP tomorrow and her oncologist in April. Labs from ED showed BUN 27, creatinine 1.39, EGFR 39 on 03/27/22. We have discussed right nephrostomy tube in detail and patient does not wish to proceed with this.   04/23/22: 77 yo woman with recent stent exhange last month here with persistent intermittent right renal colic. She had recent CT that shows stability of 6.7 cm retroperitoneal mass compressing right ureter and resulting in hydronephrosis. She has previously tolerated stent exchanges well but this last time has had increased pain. She has refused nephrostomy tubes several times.   05/23/22: 77 year old woman with ovarian cancer and retroperitoneal mass causing obstruction of right ureter currently managed with right indwelling ureteral stent here for follow-up. She had a stent exchange in March but continued to have right lower quadrant pain. We decided to change the stent position to see if this would help her at all. She comes in today saying she is 90% better. She is taking 1 tramadol at night to help her sleep. Her right leg swelling has improved but her left leg swelling remains. Overall she feels tired and weak. Her next scan is in June.   10/13/2022: 77 year old female with ovarian cancer and a retroperitoneal mass causing obstruction of right ureter who is managed with chronic indwelling right ureteral stent presents today for discussion of next stent exchange. She denies fevers and chills. She denies stent pain and discomfort. She is having some frequency and  urgency that is worse at night. She has taken Solifenacin in the past for this and found it beneficial. She has decided to stop chemotherapy due to the insufferable side effects.     ALLERGIES: Cipro hydrocodone - Dizziness Prednisone - Anxiety sulfa - Hives    MEDICATIONS: Airborne  Amlodipine Besilate  Claritin  Glipizide  Metamucil Fiber Thin  Multiple Vitamin  Pantoprazole Sodium PRN  Tramadol Hcl 50 mg tablet 1 tablet PO Q 6 H PRN  Tramadol Hcl 50 mg tablet 1 tablet PO Q 8 H PRN severe abdominal pain  Tylenol PRN  Vitamin B12     GU PSH: Cystocele Repair Cystoscopy Insert Stent - 03/18/2022, 10/01/2021, 2023     NON-GU PSH: Breast Biopsy, Bilateral Cholecystectomy (laparoscopic) Partial Hysterectomy     GU PMH: Hydronephrosis - 05/23/2022, - 05/05/2022, - 04/23/2022, - 04/02/2022, - 02/18/2022, - 10/15/2021, - 09/06/2021, - 06/24/2021, - 2023, - 2023, - 2023 RLQ pain - 05/23/2022, - 05/05/2022, - 04/23/2022, - 04/02/2022 Nocturia - 04/02/2022, (Stable), - 02/18/2022, I have recommended a trial of Myrbetriq 50 mg. I told her that if she finds that this does not allow her to reach her goal we could potentially try a combination of this medication and an anticholinergic. She will return reassessment., - 2021 Detrusor overactivity - 2023, Her symptoms are most consistent with bladder overactivity and she is emptying her bladder so we did discuss a trial of an alternative pharmacologic agent., - 2021 Acute kidney failure - 2023    NON-GU PMH: Anxiety Arthritis Cardiac murmur, unspecified Depression Diabetes Type 2 GERD Gout Hypercholesterolemia Hypertension Sleep Apnea    FAMILY  HISTORY: 2 daughters - Daughter 2 sons - Son Hypertension - Mother, Father, Runs in Family sickle cell anemia - Daughter stroke - Sister   SOCIAL HISTORY: Marital Status: Divorced Preferred Language: English; Ethnicity: Not Hispanic Or Latino; Race: White Current Smoking Status: Patient has never  smoked.   Tobacco Use Assessment Completed: Used Tobacco in last 30 days? Has never drank.  Drinks 2 caffeinated drinks per day.    REVIEW OF SYSTEMS:    GU Review Female:   Patient reports frequent urination, get up at night to urinate, and leakage of urine. Patient denies hard to postpone urination, burning /pain with urination, stream starts and stops, trouble starting your stream, have to strain to urinate, and being pregnant.  Gastrointestinal (Upper):   Patient denies nausea, vomiting, and indigestion/ heartburn.  Gastrointestinal (Lower):   Patient denies diarrhea and constipation.  Constitutional:   Patient denies fever, night sweats, weight loss, and fatigue.  Skin:   Patient denies skin rash/ lesion and itching.  Eyes:   Patient denies blurred vision and double vision.  Ears/ Nose/ Throat:   Patient denies sore throat and sinus problems.  Hematologic/Lymphatic:   Patient denies swollen glands and easy bruising.  Cardiovascular:   Patient denies leg swelling and chest pains.  Respiratory:   Patient denies cough and shortness of breath.  Endocrine:   Patient denies excessive thirst.  Musculoskeletal:   Patient denies back pain and joint pain.  Neurological:   Patient denies headaches and dizziness.  Psychologic:   Patient denies depression and anxiety.   VITAL SIGNS:      10/13/2022 11:18 AM  Weight 200 lb / 90.72 kg  Height 68 in / 172.72 cm  BP 155/70 mmHg  Pulse 84 /min  Temperature 97.5 F / 36.3 C  BMI 30.4 kg/m   GU PHYSICAL EXAMINATION:    Breast: Symmetrical. No tenderness, no nipple discharge, no skin changes. No mass.  Digital Rectal Exam: Normal sphincter tone. No rectal mass.  External Genitalia: No hirsutism, no rash, no scarring, no cyst, no erythematous lesion, no papular lesion, no blanched lesion, no warty lesion. No edema.  Urethral Meatus: Normal size. Normal position. No discharge.  Urethra: No tenderness, no mass, no scarring. No hypermobility. No  leakage.  Bladder: Normal to palpation, no tenderness, no mass, normal size.  Vagina: No atrophy, no stenosis. No rectocele. No cystocele. No enterocele.  Cervix: S/P Hysterectomy  Uterus: S/P Hysterectomy  Adnexa / Parametria: No tenderness. No adnexal mass. Normal left ovary. Normal right ovary.  Anus and Perineum: No hemorrhoids. No anal stenosis. No rectal fissure, no anal fissure. No edema, no dimple, no perineal tenderness, no anal tenderness.   MULTI-SYSTEM PHYSICAL EXAMINATION:    Constitutional: Well-nourished. No physical deformities. Normally developed. Good grooming.  Neck: Neck symmetrical, not swollen. Normal tracheal position.  Respiratory: No labored breathing, no use of accessory muscles.   Cardiovascular: Normal temperature, normal extremity pulses, no swelling, no varicosities.  Lymphatic: No enlargement of neck, axillae, groin.  Skin: No paleness, no jaundice, no cyanosis. No lesion, no ulcer, no rash.  Neurologic / Psychiatric: Oriented to time, oriented to place, oriented to person. No depression, no anxiety, no agitation.  Gastrointestinal: No mass, no tenderness, no rigidity, non obese abdomen.  Eyes: Normal conjunctivae. Normal eyelids.  Ears, Nose, Mouth, and Throat: Left ear no scars, no lesions, no masses. Right ear no scars, no lesions, no masses. Nose no scars, no lesions, no masses. Normal hearing. Normal lips.  Musculoskeletal: Normal  gait and station of head and neck.     Complexity of Data:  Source Of History:  Patient  Records Review:   Previous Doctor Records, Previous Patient Records  Urine Test Review:   Urinalysis   10/13/22  Urinalysis  Urine Appearance Slightly Cloudy   Urine Color Yellow   Urine Glucose Neg mg/dL  Urine Bilirubin Neg mg/dL  Urine Ketones Neg mg/dL  Urine Specific Gravity 1.015   Urine Blood Neg ery/uL  Urine pH 5.5   Urine Protein Neg mg/dL  Urine Urobilinogen 0.2 mg/dL  Urine Nitrites Neg   Urine Leukocyte Esterase 3+  leu/uL  Urine WBC/hpf 10 - 20/hpf   Urine RBC/hpf NS (Not Seen)   Urine Epithelial Cells 0 - 5/hpf   Urine Bacteria Few (10-25/hpf)   Urine Mucous Not Present   Urine Yeast NS (Not Seen)   Urine Trichomonas Not Present   Urine Cystals NS (Not Seen)   Urine Casts NS (Not Seen)   Urine Sperm Not Present    PROCEDURES:          Visit Complexity - G2211          Urinalysis w/Scope Dipstick Dipstick Cont'd Micro  Color: Yellow Bilirubin: Neg mg/dL WBC/hpf: 10 - 16/XWR  Appearance: Slightly Cloudy Ketones: Neg mg/dL RBC/hpf: NS (Not Seen)  Specific Gravity: 1.015 Blood: Neg ery/uL Bacteria: Few (10-25/hpf)  pH: 5.5 Protein: Neg mg/dL Cystals: NS (Not Seen)  Glucose: Neg mg/dL Urobilinogen: 0.2 mg/dL Casts: NS (Not Seen)    Nitrites: Neg Trichomonas: Not Present    Leukocyte Esterase: 3+ leu/uL Mucous: Not Present      Epithelial Cells: 0 - 5/hpf      Yeast: NS (Not Seen)      Sperm: Not Present    ASSESSMENT:      ICD-10 Details  1 GU:   Hydronephrosis - N13.0 Right, Chronic, Stable  2   Detrusor overactivity - N31.1 Chronic, Stable   PLAN:            Medications New Meds: Solifenacin Succinate 5 mg tablet 1 tablet PO Daily   #90  1 Refill(s)  Pharmacy Name:  [facility]  Address:  [address]   [place], [place] [address]  Phone:  [phone]  Fax:  [phone]            Orders Labs CULTURE, URINE          Schedule Return Visit/Planned Activity: Next Available Appointment - Schedule Surgery          Document Letter(s):  Created for Patient: Clinical Summary         Notes:   Will send urine for precautionary culture. It is about time for her next stent exchange and I will set this up. Advised the use of Solifenacin as she has tolerated this well in the past for urinary frequency urgency and nocturia related to the stent.

## 2022-10-20 NOTE — Patient Outreach (Signed)
Care Coordination   Follow Up Visit Note   10/20/2022 Name: Laura [name] MRN: [id number] DOB: 1946-01-12  Laura [name] is a 77 y.o. year old female who sees Laura Levins, MD for primary care. I spoke with  Laura [name] by phone today.  What matters to the patients health and wellness today?  Symptom Management and Level of Care   Goals Addressed             This Visit's Progress    Management of MH Symptoms   On track    Activities and task to complete in order to accomplish goals.   Keep all upcoming appointments discussed today Continue with compliance of taking medication prescribed by Doctor Implement healthy coping skills discussed to assist with management of symptoms Continue working with [facility] care team to assist with goals identified          SDOH assessments and interventions completed:  No     Care Coordination Interventions:  Yes, provided  Interventions Today    Flowsheet Row Most Recent Value  Chronic Disease   Chronic disease during today's visit Hypertension (HTN), Other  [Ovarian cancer]  General Interventions   General Interventions Discussed/Reviewed General Interventions Reviewed, Doctor Visits, Science writer has upcoming appts with Palliative Care and Personal Care Services In-Home Assessment. Strategies discussed to assist patient with remembering questions she would like to address at upcoming appts]  Doctor Visits Discussed/Reviewed Doctor Visits Reviewed  Level of Care Personal Care Services  Mental Health Interventions   Mental Health Discussed/Reviewed Mental Health Reviewed, Coping Strategies, Anxiety  [Patient continues to receive strong support from daughter, who will stay with her 24 hrs after surgery. Discussed healthy coping skills to assist with self-care and promote positive mood while decreasing anxiety symptoms]  Nutrition Interventions   Nutrition Discussed/Reviewed Nutrition Reviewed  Pharmacy Interventions    Pharmacy Dicussed/Reviewed Pharmacy Topics Reviewed  Safety Interventions   Safety Discussed/Reviewed Safety Reviewed       Follow up plan: Follow up call scheduled for 2 weeks    Encounter Outcome:  Patient Visit Completed   Jenel Lucks, MSW, LCSW [facility] Care Management [facility]  [facility] [email or web address] Phone [phone] 5:21 PM

## 2022-10-20 NOTE — Patient Instructions (Signed)
Visit Information  Thank you for taking time to visit with me today. Please don't hesitate to contact me if I can be of assistance to you.   Following are the goals we discussed today:   Goals Addressed             This Visit's Progress    Management of MH Symptoms   On track    Activities and task to complete in order to accomplish goals.   Keep all upcoming appointments discussed today Continue with compliance of taking medication prescribed by Doctor Implement healthy coping skills discussed to assist with management of symptoms Continue working with [facility] care team to assist with goals identified          Our next appointment is by telephone on 10/21 at 11:30 AM  Please call the care guide team at [phone] if you need to cancel or reschedule your appointment.   If you are experiencing a Mental Health or Behavioral Health Crisis or need someone to talk to, please call the Suicide and Crisis Lifeline: 988 call 911   Patient verbalizes understanding of instructions and care plan provided today and agrees to view in MyChart. Active MyChart status and patient understanding of how to access instructions and care plan via MyChart confirmed with patient.     Jenel Lucks, MSW, LCSW [facility] Care Management [place]  [facility] [email or web address] Phone [phone] 5:22 PM

## 2022-10-21 ENCOUNTER — Ambulatory Visit (HOSPITAL_COMMUNITY): Payer: 59 | Admitting: Physician Assistant

## 2022-10-21 ENCOUNTER — Encounter (HOSPITAL_COMMUNITY): Payer: Self-pay | Admitting: Urology

## 2022-10-21 ENCOUNTER — Ambulatory Visit (HOSPITAL_COMMUNITY): Payer: 59

## 2022-10-21 ENCOUNTER — Other Ambulatory Visit: Payer: Self-pay

## 2022-10-21 ENCOUNTER — Encounter (HOSPITAL_COMMUNITY)
Admission: RE | Disposition: A | Discharge status: Home / Self Care | Payer: Self-pay | Source: Ambulatory Visit | Attending: Urology

## 2022-10-21 ENCOUNTER — Other Ambulatory Visit (HOSPITAL_BASED_OUTPATIENT_CLINIC_OR_DEPARTMENT_OTHER): Payer: Self-pay

## 2022-10-21 ENCOUNTER — Ambulatory Visit (HOSPITAL_BASED_OUTPATIENT_CLINIC_OR_DEPARTMENT_OTHER): Payer: 59 | Admitting: Anesthesiology

## 2022-10-21 ENCOUNTER — Ambulatory Visit (HOSPITAL_COMMUNITY)
Admission: RE | Admit: 2022-10-21 | Discharge: 2022-10-21 | Disposition: A | Discharge status: Home / Self Care | Payer: 59 | Source: Ambulatory Visit | Attending: Urology | Admitting: Urology

## 2022-10-21 DIAGNOSIS — I10 Essential (primary) hypertension: Secondary | ICD-10-CM | POA: Insufficient documentation

## 2022-10-21 DIAGNOSIS — Z960 Presence of urogenital implants: Secondary | ICD-10-CM | POA: Diagnosis not present

## 2022-10-21 DIAGNOSIS — I129 Hypertensive chronic kidney disease with stage 1 through stage 4 chronic kidney disease, or unspecified chronic kidney disease: Secondary | ICD-10-CM

## 2022-10-21 DIAGNOSIS — I071 Rheumatic tricuspid insufficiency: Secondary | ICD-10-CM | POA: Diagnosis not present

## 2022-10-21 DIAGNOSIS — E119 Type 2 diabetes mellitus without complications: Secondary | ICD-10-CM | POA: Diagnosis not present

## 2022-10-21 DIAGNOSIS — N1330 Unspecified hydronephrosis: Secondary | ICD-10-CM

## 2022-10-21 DIAGNOSIS — N189 Chronic kidney disease, unspecified: Secondary | ICD-10-CM | POA: Diagnosis not present

## 2022-10-21 DIAGNOSIS — K219 Gastro-esophageal reflux disease without esophagitis: Secondary | ICD-10-CM | POA: Insufficient documentation

## 2022-10-21 DIAGNOSIS — E1122 Type 2 diabetes mellitus with diabetic chronic kidney disease: Secondary | ICD-10-CM | POA: Diagnosis not present

## 2022-10-21 DIAGNOSIS — N311 Reflex neuropathic bladder, not elsewhere classified: Secondary | ICD-10-CM | POA: Diagnosis not present

## 2022-10-21 DIAGNOSIS — G4730 Sleep apnea, unspecified: Secondary | ICD-10-CM | POA: Insufficient documentation

## 2022-10-21 DIAGNOSIS — Z923 Personal history of irradiation: Secondary | ICD-10-CM | POA: Diagnosis not present

## 2022-10-21 DIAGNOSIS — E669 Obesity, unspecified: Secondary | ICD-10-CM | POA: Diagnosis not present

## 2022-10-21 DIAGNOSIS — C569 Malignant neoplasm of unspecified ovary: Secondary | ICD-10-CM | POA: Insufficient documentation

## 2022-10-21 DIAGNOSIS — Z01818 Encounter for other preprocedural examination: Secondary | ICD-10-CM

## 2022-10-21 HISTORY — PX: CYSTOSCOPY WITH RETROGRADE PYELOGRAM, URETEROSCOPY AND STENT PLACEMENT: SHX5789

## 2022-10-21 LAB — CBC
HCT: 39.6 % (ref 36.0–46.0)
Hemoglobin: 13.1 g/dL (ref 12.0–15.0)
MCH: 31.7 pg (ref 26.0–34.0)
MCHC: 33.1 g/dL (ref 30.0–36.0)
MCV: 95.9 fL (ref 80.0–100.0)
Platelets: 206 10*3/uL (ref 150–400)
RBC: 4.13 MIL/uL (ref 3.87–5.11)
RDW: 17.3 % — ABNORMAL HIGH (ref 11.5–15.5)
WBC: 7.6 10*3/uL (ref 4.0–10.5)
nRBC: 0 % (ref 0.0–0.2)

## 2022-10-21 LAB — HEMOGLOBIN A1C
Hgb A1c MFr Bld: 5.9 % — ABNORMAL HIGH (ref 4.8–5.6)
Mean Plasma Glucose: 122.63 mg/dL

## 2022-10-21 LAB — GLUCOSE, CAPILLARY
Glucose-Capillary: 100 mg/dL — ABNORMAL HIGH (ref 70–99)
Glucose-Capillary: 81 mg/dL (ref 70–99)

## 2022-10-21 LAB — BASIC METABOLIC PANEL
Anion gap: 10 (ref 5–15)
BUN: 25 mg/dL — ABNORMAL HIGH (ref 8–23)
CO2: 19 mmol/L — ABNORMAL LOW (ref 22–32)
Calcium: 9.8 mg/dL (ref 8.9–10.3)
Chloride: 107 mmol/L (ref 98–111)
Creatinine, Ser: 1.23 mg/dL — ABNORMAL HIGH (ref 0.44–1.00)
GFR, Estimated: 45 mL/min — ABNORMAL LOW (ref >60)
Glucose, Bld: 96 mg/dL (ref 70–99)
Potassium: 3.8 mmol/L (ref 3.5–5.1)
Sodium: 136 mmol/L (ref 135–145)

## 2022-10-21 SURGERY — CYSTOURETEROSCOPY, WITH RETROGRADE PYELOGRAM AND STENT INSERTION
Anesthesia: General | Laterality: Right

## 2022-10-21 MED ORDER — ONDANSETRON HCL 4 MG/2ML IJ SOLN
INTRAMUSCULAR | Status: AC
  Filled 2022-10-21: qty 4

## 2022-10-21 MED ORDER — IOHEXOL 300 MG/ML  SOLN
INTRAMUSCULAR | Status: DC | PRN
  Administered 2022-10-21: 8 mL

## 2022-10-21 MED ORDER — ONDANSETRON HCL 4 MG/2ML IJ SOLN
INTRAMUSCULAR | Status: AC
  Filled 2022-10-21: qty 2

## 2022-10-21 MED ORDER — ACETAMINOPHEN 500 MG PO TABS
1000.0000 mg | ORAL_TABLET | Freq: Once | ORAL | Status: AC
  Administered 2022-10-21: 1000 mg via ORAL
  Filled 2022-10-21: qty 2

## 2022-10-21 MED ORDER — ONDANSETRON HCL 4 MG/2ML IJ SOLN
INTRAMUSCULAR | Status: DC | PRN
  Administered 2022-10-21: 4 mg via INTRAVENOUS

## 2022-10-21 MED ORDER — PROPOFOL 10 MG/ML IV BOLUS
INTRAVENOUS | Status: AC
  Filled 2022-10-21: qty 20

## 2022-10-21 MED ORDER — FENTANYL CITRATE (PF) 100 MCG/2ML IJ SOLN
INTRAMUSCULAR | Status: AC
  Filled 2022-10-21: qty 2

## 2022-10-21 MED ORDER — ONDANSETRON HCL 4 MG/2ML IJ SOLN: 4.0000 mg | Freq: Once | INTRAMUSCULAR | Status: DC | PRN

## 2022-10-21 MED ORDER — OXYCODONE HCL 5 MG/5ML PO SOLN: 5.0000 mg | Freq: Once | ORAL | Status: DC | PRN

## 2022-10-21 MED ORDER — SODIUM CHLORIDE 0.9 % IR SOLN
Status: DC | PRN
  Administered 2022-10-21: 3000 mL via INTRAVESICAL

## 2022-10-21 MED ORDER — GENTAMICIN SULFATE 40 MG/ML IJ SOLN
5.0000 mg/kg | INTRAVENOUS | Status: AC
  Administered 2022-10-21: 410 mg via INTRAVENOUS
  Filled 2022-10-21: qty 10.25

## 2022-10-21 MED ORDER — LIDOCAINE HCL (CARDIAC) PF 100 MG/5ML IV SOSY
PREFILLED_SYRINGE | INTRAVENOUS | Status: DC | PRN
  Administered 2022-10-21: 80 mg via INTRAVENOUS

## 2022-10-21 MED ORDER — PROPOFOL 10 MG/ML IV BOLUS
INTRAVENOUS | Status: DC | PRN
  Administered 2022-10-21: 150 mg via INTRAVENOUS

## 2022-10-21 MED ORDER — LACTATED RINGERS IV SOLN: INTRAVENOUS | Status: DC

## 2022-10-21 MED ORDER — LACTATED RINGERS IV SOLN: INTRAVENOUS | Status: DC | PRN

## 2022-10-21 MED ORDER — FENTANYL CITRATE PF 50 MCG/ML IJ SOSY: 25.0000 ug | PREFILLED_SYRINGE | INTRAMUSCULAR | Status: DC | PRN

## 2022-10-21 MED ORDER — OXYCODONE HCL 5 MG PO TABS: 5.0000 mg | ORAL_TABLET | Freq: Once | ORAL | Status: DC | PRN

## 2022-10-21 MED ORDER — FENTANYL CITRATE (PF) 100 MCG/2ML IJ SOLN
INTRAMUSCULAR | Status: DC | PRN
  Administered 2022-10-21: 50 ug via INTRAVENOUS

## 2022-10-21 MED ORDER — DEXAMETHASONE SODIUM PHOSPHATE 10 MG/ML IJ SOLN
INTRAMUSCULAR | Status: AC
  Filled 2022-10-21: qty 1

## 2022-10-21 MED ORDER — CHLORHEXIDINE GLUCONATE 0.12 % MT SOLN: 15.0000 mL | Freq: Once | OROMUCOSAL | Status: DC

## 2022-10-21 MED ORDER — ORAL CARE MOUTH RINSE: 15.0000 mL | Freq: Once | OROMUCOSAL | Status: DC

## 2022-10-21 SURGICAL SUPPLY — 26 items
BAG URO CATCHER STRL LF (MISCELLANEOUS) ×1 IMPLANT
BASKET ZERO TIP NITINOL 2.4FR (BASKET) IMPLANT
BSKT STON RTRVL ZERO TP 2.4FR (BASKET)
CATH URETL OPEN 5X70 (CATHETERS) ×1 IMPLANT
CLOTH BEACON ORANGE TIMEOUT ST (SAFETY) ×1 IMPLANT
DRSG TEGADERM 2-3/8X2-3/4 SM (GAUZE/BANDAGES/DRESSINGS) IMPLANT
EXTRACTOR STONE 1.7FRX115CM (UROLOGICAL SUPPLIES) IMPLANT
FIBER LASER MOSES 200 DFL (Laser) IMPLANT
FIBER LASER MOSES 365 DFL (Laser) IMPLANT
GLOVE BIO SURGEON STRL SZ 6.5 (GLOVE) ×1 IMPLANT
GOWN STRL REUS W/ TWL LRG LVL3 (GOWN DISPOSABLE) ×1 IMPLANT
GOWN STRL REUS W/TWL LRG LVL3 (GOWN DISPOSABLE) ×1
GUIDEWIRE STR DUAL SENSOR (WIRE) ×1 IMPLANT
KIT TURNOVER KIT A (KITS) IMPLANT
LASER FIB FLEXIVA PULSE ID 365 (Laser) IMPLANT
MANIFOLD NEPTUNE II (INSTRUMENTS) ×1 IMPLANT
PACK CYSTO (CUSTOM PROCEDURE TRAY) ×1 IMPLANT
PAD PREP 24X48 CUFFED NSTRL (MISCELLANEOUS) ×1 IMPLANT
SHEATH NAVIGATOR HD 11/13X28 (SHEATH) IMPLANT
SHEATH NAVIGATOR HD 11/13X36 (SHEATH) IMPLANT
STENT PERCUFLEX 4.8FRX26 (STENTS) IMPLANT
STENT URET 6FRX26 CONTOUR (STENTS) IMPLANT
TRACTIP FLEXIVA PULS ID 200XHI (Laser) IMPLANT
TRACTIP FLEXIVA PULSE ID 200 (Laser)
TUBING CONNECTING 10 (TUBING) ×1 IMPLANT
TUBING UROLOGY SET (TUBING) ×1 IMPLANT

## 2022-10-21 NOTE — Discharge Instructions (Addendum)
Post stent removal instructions    Diet:  You may return to your normal diet immediately. Because of the raw surface of your bladder, alcohol, spicy foods, foods high in acid and drinks with caffeine may cause irritation or frequency and should be used in moderation. To keep your urine flowing freely and avoid constipation, drink plenty of fluids during the day (8-10 glasses). Tip: Avoid cranberry juice because it is very acidic.  Activity:  Your physical activity doesn't need to be restricted. However, if you are very active, you may see some blood in the urine. We suggest that you reduce your activity under the circumstances until the bleeding has stopped.  Bowels:  It is important to keep your bowels regular during the postoperative period. Straining with bowel movements can cause bleeding. A bowel movement every other day is reasonable. Use a mild laxative if needed, such as milk of magnesia 2-3 tablespoons, or 2 Dulcolax tablets. Call if you continue to have problems. If you had been taking narcotics for pain, before, during or after your surgery, you may be constipated. Take a laxative if necessary.  Medication:  You should resume your pre-surgery medications unless told not to. In addition you may be given an antibiotic to prevent or treat infection. Antibiotics are not always necessary. All medication should be taken as prescribed until the bottles are finished unless you are having an unusual reaction to one of the drugs.  Problems you should report to [place]:  a. Fever greater than 101F. b. Heavy bleeding, or clots (see notes above about blood in urine). c. Inability to urinate. d. Drug reactions (hives, rash, nausea, vomiting, diarrhea). e. Severe burning or pain with urination that is not improving.

## 2022-10-21 NOTE — Op Note (Signed)
Operative Note  Preoperative diagnosis:  1.  Right hydronephrosis  Postoperative diagnosis: 1.  Right hydronephrosis  Procedure(s): 1.  Cystoscopy with right ureteral stent removal  Surgeon: Kasandra Knudsen, MD  Assistants:  None  Anesthesia:  General  Complications:  None  EBL: None  Specimens: 1.  None  Drains/Catheters: 1.  None  Intraoperative findings:   Normal urethra Bilateral orthotopic ureteral orifices with stent seen emanating from right UO Normal bladder mucosa  Indication:  Laura [name] is a 77 y.o. female with advanced ovarian cancer and retroperitoneal lymphadenopathy compressing right ureter resulting in right hydronephrosis.  Patient has been managed with right ureteral stent exchanges every 6 months.  Recent imaging shows worsening retroperitoneal lymphadenopathy, hydronephrosis and renal function.  Patient has adamantly declined a nephrostomy tube in the past.  Description of procedure:  After risks and benefits of the procedure discussed with the patient, informed consent was obtained.  The patient is taken the operating placed in supine position.  Anesthesia induced antibiotics were administered.  The patient was then repositioned in the dorsolithotomy position.  She was prepped and draped in the usual sterile fashion timeout was performed.  A 21 French rigid cystoscope was placed in the urethral meatus and advanced into the bladder under direct visualization.  The right ureteral stent seen emanating from the right ureteral orifice was grasped with a grasper and brought to the urethral meatus.  A 0.38 sensor wire was advanced through the ureteral stent and up to the kidney and fluoroscopic guidance.  The stent was removed and deemed to be intact.  Next an open-ended ureteral catheter was advanced over the wire and a retrograde pyelogram was obtained.  There was moderate hydronephrosis to a narrowing in the proximal ureter consistent with extrinsic  compression from retroperitoneal lymphadenopathy.  A 6 French by 26 cm stent was then attempted to be placed over the wire however this was met with significant resistance and unsuccessful.  Attempts were again were made with a 4.8 x 26 cm stent which were also unsuccessful.  The decision was made to end the case.  The bladder was decompressed and cystoscope was removed.  The patient emerged from anesthesia and transferred back in stable condition.   Plan: I have reached out to her oncologist to let him know a stent cannot be placed.  If she would like to proceed with a nephrostomy tube I am happy to help arrange this.

## 2022-10-21 NOTE — Anesthesia Procedure Notes (Signed)
Procedure Name: LMA Insertion Date/Time: 10/21/2022 1:43 PM  Performed by: Deri Fuelling, CRNAPre-anesthesia Checklist: Patient identified, Emergency Drugs available, Suction available and Patient being monitored Patient Re-evaluated:Patient Re-evaluated prior to induction Oxygen Delivery Method: Circle system utilized Preoxygenation: Pre-oxygenation with 100% oxygen Induction Type: IV induction Ventilation: Mask ventilation without difficulty LMA: LMA inserted LMA Size: 4.0 Tube type: Oral Number of attempts: 1 Airway Equipment and Method: Stylet and Oral airway Placement Confirmation: ETT inserted through vocal cords under direct vision, positive ETCO2 and breath sounds checked- equal and bilateral Tube secured with: Tape Dental Injury: Teeth and Oropharynx as per pre-operative assessment

## 2022-10-21 NOTE — Anesthesia Preprocedure Evaluation (Addendum)
Anesthesia Evaluation  Patient identified by MRN, date of birth, ID band Patient awake    Reviewed: Allergy & Precautions, NPO status , Patient's Chart, lab work & pertinent test results  History of Anesthesia Complications Negative for: history of anesthetic complications  Airway Mallampati: II  TM Distance: >3 FB Neck ROM: Full    Dental  (+) Dental Advisory Given, Partial Upper, Partial Lower   Pulmonary sleep apnea    Pulmonary exam normal        Cardiovascular hypertension, Pt. on medications + Peripheral Vascular Disease  Normal cardiovascular exam+ Valvular Problems/Murmurs    '23 TTE - EF 65 to 70%. There is mild left ventricular hypertrophy. Grade I diastolic dysfunction (impaired relaxation). Trivial mitral valve regurgitation. Tricuspid valve regurgitation is mild to moderate and eccentric.      Neuro/Psych  PSYCHIATRIC DISORDERS Anxiety      Vertigo Hearing loss   Neuromuscular disease    GI/Hepatic Neg liver ROS,GERD  ,,  Endo/Other  diabetes, Type 2, Oral Hypoglycemic Agents   Obesity   Renal/GU CRFRenal disease  Female GU complaint     Musculoskeletal  (+) Arthritis ,    Abdominal   Peds  Hematology negative hematology ROS (+)   Anesthesia Other Findings   Reproductive/Obstetrics  Ovarian cancer                              Anesthesia Physical Anesthesia Plan  ASA: 3  Anesthesia Plan: General   Post-op Pain Management: Tylenol PO (pre-op)* and Minimal or no pain anticipated   Induction: Intravenous  PONV Risk Score and Plan: 3 and Treatment may vary due to age or medical condition, Ondansetron and TIVA  Airway Management Planned: LMA  Additional Equipment: None  Intra-op Plan:   Post-operative Plan: Extubation in OR  Informed Consent: I have reviewed the patients History and Physical, chart, labs and discussed the procedure including the risks,  benefits and alternatives for the proposed anesthesia with the patient or authorized representative who has indicated his/her understanding and acceptance.     Dental advisory given  Plan Discussed with: CRNA and Anesthesiologist  Anesthesia Plan Comments:         Anesthesia Quick Evaluation

## 2022-10-21 NOTE — Transfer of Care (Signed)
Immediate Anesthesia Transfer of Care Note  Patient: Laura [name]  Procedure(s) Performed: CYSTOSCOPY, RIGHT  RETROGRADE PYELOGRAM AND RIGHT STENT REMOVAL (Right)  Patient Location: PACU  Anesthesia Type:General  Level of Consciousness: awake and alert   Airway & Oxygen Therapy: Patient Spontanous Breathing and Patient connected to nasal cannula oxygen  Post-op Assessment: Report given to RN and Post -op Vital signs reviewed and stable  Post vital signs: Reviewed and stable  Last Vitals:  Vitals Value Taken Time  BP 133/71 10/21/22 1408  Temp    Pulse 71 10/21/22 1409  Resp 15 10/21/22 1409  SpO2 100 % 10/21/22 1409  Vitals shown include unfiled device data.  Last Pain:  Vitals:   10/21/22 1105  TempSrc: Oral         Complications: No notable events documented.

## 2022-10-21 NOTE — Interval H&P Note (Signed)
History and Physical Interval Note:  10/21/2022 1:12 PM  Laura [name]  has presented today for surgery, with the diagnosis of RIGHT HYDRONEPHROSIS.  The various methods of treatment have been discussed with the patient and family. After consideration of risks, benefits and other options for treatment, the patient has consented to  Procedure(s): CYSTOSCOPY, RIGHT  RETROGRADE PYELOGRAM AND RIGHT URETERAL STENT EXCHANGE (Right) as a surgical intervention.  The patient's history has been reviewed, patient examined, no change in status, stable for surgery.  I have reviewed the patient's chart and labs.  Questions were answered to the patient's satisfaction.     Hitoshi Werts D Marthena Whitmyer

## 2022-10-21 NOTE — Anesthesia Postprocedure Evaluation (Signed)
Anesthesia Post Note  Patient: Laura [name]  Procedure(s) Performed: CYSTOSCOPY, RIGHT  RETROGRADE PYELOGRAM AND RIGHT STENT REMOVAL (Right)     Patient location during evaluation: PACU Anesthesia Type: General Level of consciousness: awake and alert Pain management: pain level controlled Vital Signs Assessment: post-procedure vital signs reviewed and stable Respiratory status: spontaneous breathing, nonlabored ventilation and respiratory function stable Cardiovascular status: stable and blood pressure returned to baseline Anesthetic complications: no   No notable events documented.  Last Vitals:  Vitals:   10/21/22 1509 10/21/22 1515  BP: (!) 148/74 (!) 157/85  Pulse: 80 89  Resp: 20   Temp: (!) 36.4 C   SpO2: 100% 100%    Last Pain:  Vitals:   10/21/22 1515  TempSrc:   PainSc: 0-No pain                 Beryle Lathe

## 2022-10-22 ENCOUNTER — Encounter (HOSPITAL_COMMUNITY): Payer: Self-pay | Admitting: Urology

## 2022-10-27 ENCOUNTER — Inpatient Hospital Stay: Payer: 59 | Admitting: Hematology

## 2022-10-27 ENCOUNTER — Inpatient Hospital Stay: Payer: 59

## 2022-10-30 ENCOUNTER — Inpatient Hospital Stay: Payer: 59

## 2022-10-30 ENCOUNTER — Inpatient Hospital Stay: Payer: 59 | Attending: Hematology | Admitting: Hematology

## 2022-10-30 DIAGNOSIS — Z90722 Acquired absence of ovaries, bilateral: Secondary | ICD-10-CM | POA: Insufficient documentation

## 2022-10-30 DIAGNOSIS — M545 Low back pain, unspecified: Secondary | ICD-10-CM | POA: Insufficient documentation

## 2022-10-30 DIAGNOSIS — C563 Malignant neoplasm of bilateral ovaries: Secondary | ICD-10-CM | POA: Insufficient documentation

## 2022-10-30 DIAGNOSIS — C569 Malignant neoplasm of unspecified ovary: Secondary | ICD-10-CM

## 2022-10-30 DIAGNOSIS — Z90710 Acquired absence of both cervix and uterus: Secondary | ICD-10-CM | POA: Insufficient documentation

## 2022-10-30 DIAGNOSIS — C800 Disseminated malignant neoplasm, unspecified: Secondary | ICD-10-CM | POA: Diagnosis not present

## 2022-10-30 DIAGNOSIS — R1031 Right lower quadrant pain: Secondary | ICD-10-CM | POA: Diagnosis not present

## 2022-10-30 DIAGNOSIS — Z95828 Presence of other vascular implants and grafts: Secondary | ICD-10-CM

## 2022-10-30 DIAGNOSIS — Z803 Family history of malignant neoplasm of breast: Secondary | ICD-10-CM | POA: Insufficient documentation

## 2022-10-30 DIAGNOSIS — Z808 Family history of malignant neoplasm of other organs or systems: Secondary | ICD-10-CM | POA: Diagnosis not present

## 2022-10-30 LAB — CBC WITH DIFFERENTIAL/PLATELET
Abs Immature Granulocytes: 0.03 10*3/uL (ref 0.00–0.07)
Basophils Absolute: 0 10*3/uL (ref 0.0–0.1)
Basophils Relative: 0 %
Eosinophils Absolute: 0.1 10*3/uL (ref 0.0–0.5)
Eosinophils Relative: 2 %
HCT: 34.2 % — ABNORMAL LOW (ref 36.0–46.0)
Hemoglobin: 11.6 g/dL — ABNORMAL LOW (ref 12.0–15.0)
Immature Granulocytes: 0 %
Lymphocytes Relative: 16 %
Lymphs Abs: 1.2 10*3/uL (ref 0.7–4.0)
MCH: 31.7 pg (ref 26.0–34.0)
MCHC: 33.9 g/dL (ref 30.0–36.0)
MCV: 93.4 fL (ref 80.0–100.0)
Monocytes Absolute: 0.5 10*3/uL (ref 0.1–1.0)
Monocytes Relative: 7 %
Neutro Abs: 5.4 10*3/uL (ref 1.7–7.7)
Neutrophils Relative %: 75 %
Platelets: 189 10*3/uL (ref 150–400)
RBC: 3.66 MIL/uL — ABNORMAL LOW (ref 3.87–5.11)
RDW: 16.4 % — ABNORMAL HIGH (ref 11.5–15.5)
WBC: 7.2 10*3/uL (ref 4.0–10.5)
nRBC: 0 % (ref 0.0–0.2)

## 2022-10-30 LAB — COMPREHENSIVE METABOLIC PANEL
ALT: 14 U/L (ref 0–44)
AST: 21 U/L (ref 15–41)
Albumin: 3.7 g/dL (ref 3.5–5.0)
Alkaline Phosphatase: 54 U/L (ref 38–126)
Anion gap: 11 (ref 5–15)
BUN: 25 mg/dL — ABNORMAL HIGH (ref 8–23)
CO2: 18 mmol/L — ABNORMAL LOW (ref 22–32)
Calcium: 9.2 mg/dL (ref 8.9–10.3)
Chloride: 103 mmol/L (ref 98–111)
Creatinine, Ser: 1.55 mg/dL — ABNORMAL HIGH (ref 0.44–1.00)
GFR, Estimated: 34 mL/min — ABNORMAL LOW (ref >60)
Glucose, Bld: 194 mg/dL — ABNORMAL HIGH (ref 70–99)
Potassium: 3.7 mmol/L (ref 3.5–5.1)
Sodium: 132 mmol/L — ABNORMAL LOW (ref 135–145)
Total Bilirubin: 0.5 mg/dL (ref 0.3–1.2)
Total Protein: 7 g/dL (ref 6.5–8.1)

## 2022-10-30 LAB — MAGNESIUM: Magnesium: 2.1 mg/dL (ref 1.7–2.4)

## 2022-10-30 MED ORDER — HEPARIN SOD (PORK) LOCK FLUSH 100 UNIT/ML IV SOLN
500.0000 [IU] | Freq: Once | INTRAVENOUS | Status: AC
  Administered 2022-10-30: 500 [IU] via INTRAVENOUS

## 2022-10-30 MED ORDER — SODIUM CHLORIDE 0.9% FLUSH
10.0000 mL | Freq: Once | INTRAVENOUS | Status: AC
  Administered 2022-10-30: 10 mL via INTRAVENOUS

## 2022-10-30 NOTE — Progress Notes (Signed)
Patients port flushed without difficulty.  Good blood return noted with no bruising or swelling noted at site.  Band aid applied.  VSS with discharge and left in satisfactory condition with no s/s of distress noted.

## 2022-10-30 NOTE — Patient Instructions (Signed)
[facility]  Discharge Instructions  You were seen and examined today by Dr. Ellin Saba.  Unfortunately, you did not tolerate Enhertu well. There are no further options for treatment that would be without very difficult side effects.  Dr. Ellin Saba recommends continuing to meet with Palliative Care. They will completely manage your care moving forward. You do not need to follow-up here.  Thank you for choosing [facility] to provide your oncology and hematology care.   To afford each patient quality time with our provider, please arrive at least 15 minutes before your scheduled appointment time. You may need to reschedule your appointment if you arrive late (10 or more minutes). Arriving late affects you and other patients whose appointments are after yours.  Also, if you miss three or more appointments without notifying the office, you may be dismissed from the clinic at the provider's discretion.    Again, thank you for choosing [facility].  Our hope is that these requests will decrease the amount of time that you wait before being seen by our physicians.   If you have a lab appointment with the Cancer Center - please note that after April 8th, all labs will be drawn in the cancer center.  You do not have to check in or register with the main entrance as you have in the past but will complete your check-in at the cancer center.            _____________________________________________________________  Should you have questions after your visit to [facility], please contact our office at [phone] and follow the prompts.  Our office hours are 8:00 a.m. to 4:30 p.m. Monday - Thursday and 8:00 a.m. to 2:30 p.m. Friday.  Please note that voicemails left after 4:00 p.m. may not be returned until the following business day.  We are closed weekends and all major holidays.  You do have access to a nurse 24-7, just  call the main number to the clinic [phone] and do not press any options, hold on the line and a nurse will answer the phone.    For prescription refill requests, have your pharmacy contact our office and allow 72 hours.    Masks are no longer required in the cancer centers. If you would like for your care team to wear a mask while they are taking care of you, please let them know. You may have one support person who is at least 78 years old accompany you for your appointments.

## 2022-10-30 NOTE — Progress Notes (Signed)
[facility] [address] [place] [address]    Clinic Day:  10/30/22   Referring physician: Corwin Levins, MD  Patient Care Team: Corwin Levins, MD as PCP - General (Internal Medicine) Parke Poisson, MD as PCP - Cardiology (Cardiology) Rachael Fee, MD as Attending Physician (Gastroenterology) Christia Reading, MD as Attending Physician (Otolaryngology) [place], Peter M, MD (Cardiology) Kerrin Champagne, MD (Inactive) as Consulting Physician (Orthopedic Surgery) Doreatha Massed, MD as Medical Oncologist (Oncology) Mickie Bail, RN as Oncology Nurse Navigator (Oncology)   ASSESSMENT & PLAN:   Assessment: 1.  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  Mass measures 6 x 5.8 cm and is partially calcified.  Separate solid component superior to this in the left periaortic region measures 3.5 cm, SUV 14.3.  Cystic component medial to the lower pole of the left kidney is without hypermetabolic activity, possibly a lymphocele. -CT-guided biopsy of the right retroperitoneal lymph node consistent with adenocarcinoma with psammoma bodies.  Morphology and immunotherapy consistent with ovarian serous carcinoma/primary peritoneal carcinoma. -Germline mutation testing was negative. -Foundation 1 testing shows MS-stable.  Loss of heterozygosity was less than 16%. -CA-125 346 on 06/23/2019. - 6 cycles of carboplatin and paclitaxel from 07/28/2019 through 11/17/2019 -CTAP on 10/04/2019 after 3 cycles of chemotherapy showed retroperitoneal adenopathy at the bifurcation measuring 5.2 x 2.9 cm, left para-aortic lymphadenopathy measuring 5 x 4.4 cm, both of them decreased in size when compared to most recent PET scan.  CT scan report says that one of the lesion has gotten bigger but this was compared to CT scan from 04/02/2019. -PET scan on December 05, 2019 shows 4.8 x 4.2 cm fluid filled density in the left para-aortic region, non-FDG avid favoring benign retroperitoneal cyst versus lymphangioma.  Right/anterior para-aortic mixed cystic/solid lesions measuring 2.4 x 4.8 cm, SUV 2.5, previous SUV 19.5. -Robotic assisted laparoscopic total hysterectomy and bilateral salpingo-oophorectomy by Dr. Andrey Farmer on 01/03/2020. -Pathology showed soft tissue deformities 2 sites and psammomatous calcifications and chronic inflammation with no malignancy identified in the para-aortic lymph node.  Right salpingo-oophorectomy showed microscopic focus of residual adenocarcinoma less than 1 mm.  No malignancy in the left ovary.  [id number]. -As she had prior difficulty with chemotherapy, no further chemotherapy after surgery was recommended. - XRT to retroperitoneal nodal mass (IMRT) from 01/21/2021 through 02/01/2021.  10 fractions, 40 Gray. - Right ureteral stent placement on 03/13/2021 due to right hydronephrosis from retroperitoneal nodal mass. - CTAP (04/01/2021): Centrally necrotic lymph node has increased in size. - We discussed CT findings and reviewed images.  Last tumor marker was normal. - She complained of right lower quadrant pain radiating to the back which started on 05/11/2021 and ended on 05/13/2021 night. - 6 cycles of dose reduced carboplatin and paclitaxel from 05/22/2021 through 09/04/2021. - Germline mutation testing negative - Carboplatin and paclitaxel restarted on 06/24/2022 for recurrence, bevacizumab added with cycle 2 on 07/21/2022. - Guardant360 (07/24/2022): ERBB2 amplification with benefit from trastuzumab deruxtecan, plasma copy #2.3, medium (++).  MSI high not detected.  Plan: 1.  Recurrent ovarian serous carcinoma: - She received 1 cycle of Enhertu on 09/10/2022. - She reported that she did not feel well starting on day 3 which lasted about 3 weeks.  She reported decreased eating and increased weakness.  She also reported feeling very nervous and  anxious. - She had a cystoscopy  with stent removal on 10/21/2022.  Stent could not be replaced.  Nephrostomy tube was recommended. - Patient is reluctant to consider nephrostomy tube placement. - Reviewed labs today: Normal LFTs.  Creatinine has worsened to 1.55 from 1.23 previously.  CBC grossly normal. - She is seen with her granddaughter today.  We had a prolonged discussion including further treatment options which are likely to be more side effect inducing.  She had problems tolerating chemotherapy.  We discussed best supportive care in the form of hospice.  She is currently enrolled in palliative care.  She will continue with that and advance to hospice when she needs it.    2.  Back and right groin pain: - She reports pain has improved. - Continue to use Dilaudid as needed.  No orders of the defined types were placed in this encounter.     Alben Deeds Teague,acting as a Neurosurgeon for Doreatha Massed, MD.,have documented all relevant documentation on the behalf of Doreatha Massed, MD,as directed by  Doreatha Massed, MD while in the presence of Doreatha Massed, MD.  I, Doreatha Massed MD, have reviewed the above documentation for accuracy and completeness, and I agree with the above.      Doreatha Massed, MD   10/17/20246:39 PM  CHIEF COMPLAINT:   Diagnosis: Recurrent serous ovarian cancer.   Cancer Staging  No matching staging information was found for the patient.    Prior Therapy: Chemotherapy  Current Therapy: Carboplatin and paclitaxel and bevacizumab   HISTORY OF PRESENT ILLNESS:   Oncology History  Malignant neoplasm of both ovaries  06/20/2019 Initial Diagnosis   Primary ovarian adenocarcinoma, unspecified laterality (HCC)   07/01/2019 Genetic Testing   Foundation One     07/11/2019 Genetic Testing   Negative genetic testing:  No pathogenic variants detected on the [facility] Multi-Cancer Panel. The report date is 07/11/2019.  The  Multi-Cancer Panel offered by [facility] includes sequencing and/or deletion duplication testing of the following 85 genes: AIP, ALK, APC, ATM, AXIN2,BAP1,  BARD1, BLM, BMPR1A, BRCA1, BRCA2, BRIP1, CASR, CDC73, CDH1, CDK4, CDKN1B, CDKN1C, CDKN2A (p14ARF), CDKN2A (p16INK4a), CEBPA, CHEK2, CTNNA1, DICER1, DIS3L2, EGFR (c.2369C>T, p.Thr790Met variant only), EPCAM (Deletion/duplication testing only), FH, FLCN, GATA2, GPC3, GREM1 (Promoter region deletion/duplication testing only), HOXB13 (c.251G>A, p.Gly84Glu), HRAS, KIT, MAX, MEN1, MET, MITF (c.952G>A, p.Glu318Lys variant only), MLH1, MSH2, MSH3, MSH6, MUTYH, NBN, NF1, NF2, NTHL1, PALB2, PDGFRA, PHOX2B, PMS2, POLD1, POLE, POT1, PRKAR1A, PTCH1, PTEN, RAD50, RAD51C, RAD51D, RB1, RECQL4, RET, RNF43, RUNX1, SDHAF2, SDHA (sequence changes only), SDHB, SDHC, SDHD, SMAD4, SMARCA4, SMARCB1, SMARCE1, STK11, SUFU, TERC, TERT, TMEM127, TP53, TSC1, TSC2, VHL, WRN and WT1.   07/28/2019 - 09/04/2021 Chemotherapy   Patient is on Treatment Plan : OVARIAN Carboplatin (AUC 6) / Paclitaxel (175) q21d x 6 cycles     06/24/2022 - 08/11/2022 Chemotherapy   Patient is on Treatment Plan : OVARIAN Carboplatin + Paclitaxel + Bevacizumab q21d      09/10/2022 -  Chemotherapy   Patient is on Treatment Plan : Ovarian METASTATIC Fam-Trastuzumab Deruxtecan-nxki (Enhertu) (5.4) q21d        INTERVAL HISTORY:   Laura [name] is a 77 y.o. female presenting to clinic today for follow up of malignant neoplasm. She was last seen by me on 09/01/2022.  Since her last visit, she underwent a cytoscopy on 10/21/22 with Dr. Arita Miss and had a stent removed that could not be replaced. She does not wish to have a nephrostomy placed. She presented to the ED on 09/02/22 and 09/09/22 for right lower back  pain and right flank pain. She presented to the ED again on 09/14/22 for nausea. Symptoms improved after taking prescribed compazine.   Today, she states that she is doing well overall. Her appetite level is at 65%. Her  energy level is at 0%. She is accompanied by her granddaughter.  She felt well the day of Enhertu treatment but the following day she had generalized weakness, tiredness, decreased appetite, anxiety, and inability to sleep. These symptoms occurred for 4 weeks following treatment. She also had SOB upon exertion after treatment that is still occurring. She was able to drink Boost and eat chicken noodle soup. She was unable to eat anything else without feeling like she was choking. She denies any pain for the past month. She recently enrolled in palliative care.  PAST MEDICAL HISTORY:   Past Medical History: Past Medical History:  Diagnosis Date   Anemia    Anxiety    Arthritis    back of neck, bones spurs on neck   Cancer (HCC)    ovarian cancer   Cataract both eyes    Cervical disc disease    Diabetes mellitus Type 2    Dyspnea    with exertion   Family history of adverse reaction to anesthesia    sister slow to awaken   Family history of thyroid cancer    GERD (gastroesophageal reflux disease)    Heart murmur    History of COVID-19 06/2020   pain in side took paxlovid x 5 days all symptoms resolved   History of radiation therapy    abdominal lymph nodes 01/21/2021-02/01/2021  Dr Antony Blackbird   Hydronephrosis 08/24/2020   Right Kidney (stable per CT)   Hyperlipidemia    Hypertension    IBS (irritable bowel syndrome)    Left shoulder frozen with limited rom    Mucoid cyst of joint    right thumb   Neuropathy 03/12/2021   hands/fingers both hands numb and tingle @ times   Peripheral vascular disease (HCC)    Port-A-Cath in place 07/21/2019   Reflux    Sleep apnea 03/12/2021   has not used cpap in 3 years   Vertigo 12/2010   none since treated at [facility]   Wears glasses for reading    Wears partial dentures upper     Surgical History: Past Surgical History:  Procedure Laterality Date   BLADDER SURGERY     x 3 at [facility]   BREAST EXCISIONAL BIOPSY Bilateral    BREAST SURGERY      fibroid cyst removed ? over 10 yrs ago at [facility] per pt on 03-12-2021   CHOLECYSTECTOMY     yrs ago   COLONOSCOPY  07/02/2020   and 09-19-2016   CYSTOSCOPY W/ URETERAL STENT PLACEMENT Right 03/13/2021   Procedure: CYSTOSCOPY WITH RETROGRADE PYELOGRAM/URETERAL STENT PLACEMENT;  Surgeon: Noel Christmas, MD;  Location: [facility];  Service: Urology;  Laterality: Right;  30 MINS   CYSTOSCOPY W/ URETERAL STENT PLACEMENT Right 10/01/2021   Procedure: CYSTOSCOPY WITH RETROGRADE PYELOGRAM/URETERAL STENT REPLACEMENT;  Surgeon: Noel Christmas, MD;  Location: [facility];  Service: Urology;  Laterality: Right;   CYSTOSCOPY W/ URETERAL STENT PLACEMENT Bilateral 03/18/2022   Procedure: CYSTOSCOPY WITH BILATERAL RETROGRADE PYELOGRAM/RIGHT URETERAL STENT EXCHANGE;  Surgeon: Noel Christmas, MD;  Location: [facility];  Service: Urology;  Laterality: Bilateral;  1HR   CYSTOSCOPY WITH RETROGRADE PYELOGRAM, URETEROSCOPY AND STENT PLACEMENT Right 05/06/2022   Procedure: CYSTOSCOPY WITH RIGHT RETROGRADE PYELOGRAM,  AND RIGHT URETERAL STENT EXCHANGE;  Surgeon: Noel Christmas, MD;  Location: [facility];  Service: Urology;  Laterality: Right;   CYSTOSCOPY WITH RETROGRADE PYELOGRAM, URETEROSCOPY AND STENT PLACEMENT Right 10/21/2022   Procedure: CYSTOSCOPY, RIGHT  RETROGRADE PYELOGRAM AND RIGHT STENT REMOVAL;  Surgeon: Noel Christmas, MD;  Location: [facility];  Service: Urology;  Laterality: Right;   fibroids removed     breast (both breasts) age 58 and total of 3 surgeries   history of chemotherapy     6 rounds june 2021   IR IMAGING GUIDED PORT INSERTION  07/26/2019   right   MASS EXCISION Right 06/26/2016   Procedure: EXCISION MUCOID TUMOR RIGHT THUMB, IP RIGHT THUMB;  Surgeon: Cindee Salt, MD;  Location: [facility];  Service: Orthopedics;  Laterality: Right;   ROBOTIC ASSISTED BILATERAL SALPINGO OOPHERECTOMY N/A 01/03/2020   Procedure: XI ROBOTIC ASSISTED BILATERAL  SALPINGO OOPHORECTOMY, RADICAL TUMOR DEBULKING;  Surgeon: Adolphus Birchwood, MD;  Location: [facility];  Service: Gynecology;  Laterality: N/A;   ROBOTIC PELVIC AND PARA-AORTIC LYMPH NODE DISSECTION N/A 01/03/2020   Procedure: XI ROBOTIC PARA-AORTIC LYMPHADENECTOMY;  Surgeon: Adolphus Birchwood, MD;  Location: [facility];  Service: Gynecology;  Laterality: N/A;   VAGINAL HYSTERECTOMY     age late 3's    Social History: Social History   Socioeconomic History   Marital status: Single    Spouse name: Not on file   Number of children: 4   Years of education: 16   Highest education level: Bachelor's degree (e.g., BA, AB, BS)  Occupational History   Occupation: retired Photographer  Tobacco Use   Smoking status: Never   Smokeless tobacco: Never  Vaping Use   Vaping status: Never Used  Substance and Sexual Activity   Alcohol use: No    Alcohol/week: 0.0 standard drinks of alcohol   Drug use: No   Sexual activity: Not Currently    Birth control/protection: Surgical  Other Topics Concern   Not on file  Social History Narrative   Not on file   Social Determinants of Health   Financial Resource Strain: Low Risk  (04/01/2022)   Overall Financial Resource Strain (CARDIA)    Difficulty of Paying Living Expenses: Not hard at all  Food Insecurity: No Food Insecurity (09/30/2022)   Hunger Vital Sign    Worried About Running Out of Food in the Last Year: Never true    Ran Out of Food in the Last Year: Never true  Transportation Needs: No Transportation Needs (09/30/2022)   PRAPARE - Administrator, Civil Service (Medical): No    Lack of Transportation (Non-Medical): No  Physical Activity: Unknown (04/01/2022)   Exercise Vital Sign    Days of Exercise per Week: 0 days    Minutes of Exercise per Session: Not on file  Recent Concern: Physical Activity - Inactive (04/01/2022)   Exercise Vital Sign    Days of Exercise per Week: 0 days    Minutes of Exercise per Session: 10 min  Stress:  No Stress Concern Present (04/01/2022)   [employer] of Occupational Health - Occupational Stress Questionnaire    Feeling of Stress : Only a little  Recent Concern: Stress - Stress Concern Present (03/21/2022)   [employer] of Occupational Health - Occupational Stress Questionnaire    Feeling of Stress : Very much  Social Connections: Moderately Integrated (04/01/2022)   Social Connection and Isolation Panel [NHANES]    Frequency of Communication with Friends and Family:  More than three times a week    Frequency of Social Gatherings with Friends and Family: Three times a week    Attends Religious Services: More than 4 times per year    Active Member of Clubs or Organizations: Yes    Attends Banker Meetings: More than 4 times per year    Marital Status: Divorced  Intimate Partner Violence: Not At Risk (06/15/2022)   Humiliation, Afraid, Rape, and Kick questionnaire    Fear of Current or Ex-Partner: No    Emotionally Abused: No    Physically Abused: No    Sexually Abused: No    Family History: Family History  Problem Relation Age of Onset   Hypertension Mother    Diabetes Father    Hypertension Father    Diabetes Sister    Breast cancer Sister        stage 0   Hypertension Sister    Stroke Sister    Diabetes Maternal Aunt    Thyroid cancer Daughter 28   Hypertension Other    Colon cancer Neg Hx    Esophageal cancer Neg Hx    Stomach cancer Neg Hx    Rectal cancer Neg Hx    Endometrial cancer Neg Hx    Ovarian cancer Neg Hx     Current Medications:  Current Outpatient Medications:    acetaminophen (TYLENOL) 500 MG tablet, Take 1 tablet (500 mg total) by mouth every 6 (six) hours as needed., Disp: 30 tablet, Rfl: 0   amLODipine (NORVASC) 10 MG tablet, Take 1 tablet (10 mg total) by mouth daily., Disp: 30 tablet, Rfl: 3   ASHWAGANDHA PO, Take 1 tablet by mouth daily., Disp: , Rfl:    bismuth subsalicylate (PEPTO BISMOL) 262 MG/15ML suspension, Take 30  mLs by mouth every 6 (six) hours as needed for indigestion or diarrhea or loose stools., Disp: , Rfl:    BLACK CURRANT SEED OIL PO, Take 1 capsule by mouth daily., Disp: , Rfl:    Blood Glucose Monitoring Suppl (ONE TOUCH ULTRA 2) w/Device KIT, Use as directed, Disp: 1 each, Rfl: 0   cholecalciferol (VITAMIN D3) 25 MCG (1000 UNIT) tablet, Take 2,000 Units by mouth daily., Disp: , Rfl:    Cyanocobalamin (B-12) 2500 MCG TABS, Take 2,500 mcg by mouth daily., Disp: , Rfl:    docusate sodium (COLACE) 100 MG capsule, Take 2 capsules (200 mg total) by mouth 2 (two) times daily. (Patient taking differently: Take 100 mg by mouth daily as needed for moderate constipation.), Disp: 100 capsule, Rfl: 3   famotidine-calcium carbonate-magnesium hydroxide (PEPCID COMPLETE) 10-800-165 MG chewable tablet, Chew 1 tablet by mouth daily as needed (acid reflux)., Disp: , Rfl:    furosemide (LASIX) 20 MG tablet, TAKE 1 TABLET (20MG ) BY MOUTH DAILYAS NEEDED FOR EDEMA (SWELLING) (Patient taking differently: Take 20 mg by mouth daily.), Disp: 90 tablet, Rfl: 1   glimepiride (AMARYL) 2 MG tablet, TAKE 1/2 TABLET (1MG ) BY MOUTH ONCE DAILY BEFORE BREAKFAST, Disp: 45 tablet, Rfl: 7   glucose blood (ONETOUCH ULTRA) test strip, USE TO CHECK BLOOD SUGARS TWO TIMES DAILY, Disp: 100 strip, Rfl: 3   HYDROmorphone (DILAUDID) 4 MG tablet, Take 4 mg by mouth every 4 (four) hours as needed for severe pain (PRN as needed.)., Disp: , Rfl:    Lancets (ONETOUCH ULTRASOFT) lancets, 1 each by Other route as needed for other. Use as instructed, Disp: 100 each, Rfl: 3   lidocaine (LIDODERM) 5 %, Place 1 patch onto the  skin daily. Remove & Discard patch within 12 hours or as directed by MD (Patient taking differently: Place 1-2 patches onto the skin daily as needed (pain). Remove & Discard patch within 12 hours or as directed by MD), Disp: 30 patch, Rfl: 3   loperamide (IMODIUM A-D) 2 MG tablet, Take 2-6 mg by mouth as needed for diarrhea or loose  stools., Disp: , Rfl:    loratadine (CLARITIN) 10 MG tablet, Take 10 mg by mouth daily., Disp: , Rfl:    megestrol (MEGACE) 400 MG/10ML suspension, Take 10 mLs (400 mg total) by mouth 2 (two) times daily. (Patient taking differently: Take 400 mg by mouth 2 (two) times daily as needed (appetite).), Disp: 480 mL, Rfl: 3   polyethylene glycol (MIRALAX / GLYCOLAX) 17 g packet, Take 17 g by mouth daily as needed for moderate constipation., Disp: , Rfl:    prochlorperazine (COMPAZINE) 10 MG tablet, Take 1 tablet (10 mg total) by mouth every 6 (six) hours as needed for nausea or vomiting., Disp: 30 tablet, Rfl: 2   solifenacin (VESICARE) 5 MG tablet, Take 1 tablet (5 mg total) by mouth daily. (Patient not taking: Reported on 10/15/2022), Disp: 90 tablet, Rfl: 3   solifenacin (VESICARE) 5 MG tablet, Take 1 tablet (5 mg total) by mouth daily., Disp: 90 tablet, Rfl: 1   traZODone (DESYREL) 100 MG tablet, 1/2 - 1 tab by mouth at bedtime for sleep as needed (Patient taking differently: Take 50 mg by mouth at bedtime as needed for sleep.), Disp: 90 tablet, Rfl: 1   Wheat Dextrin (BENEFIBER) CHEW, Chew 1 tablet by mouth daily as needed (constipation)., Disp: , Rfl:    Allergies: Allergies  Allergen Reactions   Ciprofloxacin Swelling    Torn tendon   Hydrocodone Bit-Homatrop Mbr Other (See Comments)    Vertigo *pt strongly prefers to never take* took 3 years to recover from   Prednisone Anxiety    *pt strongly prefers to never be given prednisone*    Sulfa Antibiotics Hives, Itching and Swelling    Tongue swells   Alfuzosin Other (See Comments)    Weakness and fatigue   Codeine Itching   Crestor [Rosuvastatin Calcium] Other (See Comments)    Did something to memory     Doxycycline Other (See Comments)    Severe rectal Gas.   Gabapentin     disoriented   Keflex [Cephalexin] Diarrhea and Nausea And Vomiting   Myrbetriq [facility]] Swelling    Swelling of legs and feet   Naproxen Other (See  Comments)    Stomach cramps/loud gas   Statins     Muscle weakness    REVIEW OF SYSTEMS:   Review of Systems  Constitutional:  Negative for chills, fatigue and fever.  HENT:   Positive for trouble swallowing. Negative for lump/mass, mouth sores, nosebleeds and sore throat.   Eyes:  Negative for eye problems.  Respiratory:  Positive for cough and shortness of breath.   Cardiovascular:  Negative for chest pain, leg swelling and palpitations.  Gastrointestinal:  Positive for constipation (occasional) and diarrhea (occasional). Negative for abdominal pain, nausea and vomiting.  Genitourinary:  Positive for difficulty urinating. Negative for bladder incontinence, dysuria, frequency, hematuria and nocturia.   Musculoskeletal:  Negative for arthralgias, back pain, flank pain, myalgias and neck pain.  Skin:  Negative for itching and rash.  Neurological:  Negative for dizziness, headaches and numbness.       +tingling in hands and feet  Hematological:  Does not  bruise/bleed easily.  Psychiatric/Behavioral:  Positive for sleep disturbance. Negative for depression and suicidal ideas. The patient is not nervous/anxious.   All other systems reviewed and are negative.    VITALS:   There were no vitals taken for this visit.  Wt Readings from Last 3 Encounters:  10/21/22 181 lb (82.1 kg)  09/14/22 181 lb 6.4 oz (82.3 kg)  09/10/22 181 lb 6.4 oz (82.3 kg)    There is no height or weight on file to calculate BMI.  Performance status (ECOG): 1 - Symptomatic but completely ambulatory  PHYSICAL EXAM:   Physical Exam Vitals and nursing note reviewed. Exam conducted with a chaperone present.  Constitutional:      Appearance: Normal appearance.  Cardiovascular:     Rate and Rhythm: Normal rate and regular rhythm.     Pulses: Normal pulses.     Heart sounds: Normal heart sounds.  Pulmonary:     Effort: Pulmonary effort is normal.     Breath sounds: Normal breath sounds.  Abdominal:      Palpations: Abdomen is soft. There is no hepatomegaly, splenomegaly or mass.     Tenderness: There is no abdominal tenderness.  Musculoskeletal:     Right lower leg: No edema.     Left lower leg: No edema.  Lymphadenopathy:     Cervical: No cervical adenopathy.     Right cervical: No superficial, deep or posterior cervical adenopathy.    Left cervical: No superficial, deep or posterior cervical adenopathy.     Upper Body:     Right upper body: No supraclavicular or axillary adenopathy.     Left upper body: No supraclavicular or axillary adenopathy.  Neurological:     General: No focal deficit present.     Mental Status: She is alert and oriented to person, place, and time.  Psychiatric:        Mood and Affect: Mood normal.        Behavior: Behavior normal.     LABS:      Latest Ref Rng & Units 10/30/2022   11:20 AM 10/21/2022   11:30 AM 09/14/2022    4:47 PM  CBC  WBC 4.0 - 10.5 K/uL 7.2  7.6  5.2   Hemoglobin 12.0 - 15.0 g/dL 93.2  35.5  73.2   Hematocrit 36.0 - 46.0 % 34.2  39.6  37.9   Platelets 150 - 400 K/uL 189  206  140       Latest Ref Rng & Units 10/30/2022   11:20 AM 10/21/2022   11:30 AM 09/14/2022    4:47 PM  CMP  Glucose 70 - 99 mg/dL 202  96  542   BUN 8 - 23 mg/dL 25  25  39   Creatinine 0.44 - 1.00 mg/dL 7.06  2.37  6.28   Sodium 135 - 145 mmol/L 132  136  132   Potassium 3.5 - 5.1 mmol/L 3.7  3.8  3.9   Chloride 98 - 111 mmol/L 103  107  99   CO2 22 - 32 mmol/L 18  19  21    Calcium 8.9 - 10.3 mg/dL 9.2  9.8  9.7   Total Protein 6.5 - 8.1 g/dL 7.0   7.3   Total Bilirubin 0.3 - 1.2 mg/dL 0.5   0.6   Alkaline Phos 38 - 126 U/L 54   67   AST 15 - 41 U/L 21   30   ALT 0 - 44 U/L 14   14  No results found for: "CEA1", "CEA" / No results found for: "CEA1", "CEA" No results found for: "PSA1" No results found for: "CAN199" Lab Results  Component Value Date   CAN125 8.0 08/11/2022    No results found for: "TOTALPROTELP", "ALBUMINELP", "A1GS",  "A2GS", "BETS", "BETA2SER", "GAMS", "MSPIKE", "SPEI" Lab Results  Component Value Date   TIBC 353 11/12/2021   FERRITIN 48 11/12/2021   IRONPCTSAT 12 11/12/2021   IRONPCTSAT 17.9 (L) 08/25/2018   Lab Results  Component Value Date   LDH 148 04/05/2019     STUDIES:   DG C-Arm 1-60 Min-No Report  Result Date: 10/21/2022 Fluoroscopy was utilized by the requesting physician.  No radiographic interpretation.

## 2022-11-03 ENCOUNTER — Ambulatory Visit: Payer: Self-pay | Admitting: Licensed Clinical Social Worker

## 2022-11-04 ENCOUNTER — Other Ambulatory Visit: Payer: Self-pay

## 2022-11-04 NOTE — Patient Instructions (Signed)
Visit Information  Thank you for taking time to visit with me today. Please don't hesitate to contact me if I can be of assistance to you.   Following are the goals we discussed today:   Goals Addressed             This Visit's Progress    Management of MH Symptoms   On track    Activities and task to complete in order to accomplish goals.   Keep all upcoming appointments discussed today Continue with compliance of taking medication prescribed by Doctor Implement healthy coping skills discussed to assist with management of symptoms Continue working with [facility] care team to assist with goals identified          Our next appointment is by telephone on 11/11 at 11:30 AM  Please call the care guide team at [phone] if you need to cancel or reschedule your appointment.   If you are experiencing a Mental Health or Behavioral Health Crisis or need someone to talk to, please call the Suicide and Crisis Lifeline: 988 call 911   Patient verbalizes understanding of instructions and care plan provided today and agrees to view in MyChart. Active MyChart status and patient understanding of how to access instructions and care plan via MyChart confirmed with patient.     Jenel Lucks, MSW, LCSW [facility] Care Management [place]  [facility] [email or web address] Phone [phone] 8:37 AM

## 2022-11-04 NOTE — Patient Outreach (Signed)
Care Coordination   Follow Up Visit Note   11/03/2022 Name: BERDINE MCCLARAN MRN: [id number] DOB: [birth year]  LIDDIE COY is a 77 y.o. year old female who sees Corwin Levins, MD for primary care. I spoke with  [name] by phone today.  What matters to the patients health and wellness today?  Symptom Management and Supportive Resources    Goals Addressed             This Visit's Progress    Management of MH Symptoms   On track    Activities and task to complete in order to accomplish goals.   Keep all upcoming appointments discussed today Continue with compliance of taking medication prescribed by Doctor Implement healthy coping skills discussed to assist with management of symptoms Continue working with [facility] care team to assist with goals identified          SDOH assessments and interventions completed:  No     Care Coordination Interventions:  Yes, provided  Interventions Today    Flowsheet Row Most Recent Value  Chronic Disease   Chronic disease during today's visit Hypertension (HTN), Other  [Ovarian cancer]  General Interventions   General Interventions Discussed/Reviewed General Interventions Reviewed, Doctor Visits  [Per pt, the surgical procedure wasn't a success according to Dr. Arita Miss, couldn't replace the stint (10/08) Pt is not interested in catheter being placed in kidney. Urinating well currently F/up appt on 10/25 with Dr. Pace.]  Doctor Visits Discussed/Reviewed Doctor Visits Reviewed  Mental Health Interventions   Mental Health Discussed/Reviewed Mental Health Reviewed, Coping Strategies, Anxiety, Depression, Grief and Loss  [Depression symptoms continue to "come and go" Validation and encouragement provided. Pt has healthy coping skills including, prayer and talking with family. Additional strategies discussed to promote relaxation, self-care, and promotion of positive mood]  Nutrition Interventions   Nutrition Discussed/Reviewed Nutrition Reviewed   Pharmacy Interventions   Pharmacy Dicussed/Reviewed Pharmacy Topics Reviewed, Medication Adherence  Safety Interventions   Safety Discussed/Reviewed Safety Reviewed  Advanced Directive Interventions   Advanced Directives Discussed/Reviewed End of Life  End of Life Palliative  [Palliative Care visited home "it went real well" Understood more of their services. Completed Medical Orders for Scope of Treatment. Will get Advanced Directives notarized.]       Follow up plan: Follow up call scheduled for 2-4 weeks    Encounter Outcome:  Patient Visit Completed   Jenel Lucks, MSW, LCSW [facility] Care Management [facility]  [facility] [email or web address] Phone [phone] 8:37 AM

## 2022-11-07 DIAGNOSIS — N130 Hydronephrosis with ureteropelvic junction obstruction: Secondary | ICD-10-CM | POA: Diagnosis not present

## 2022-11-07 DIAGNOSIS — R8271 Bacteriuria: Secondary | ICD-10-CM | POA: Diagnosis not present

## 2022-11-10 ENCOUNTER — Telehealth: Payer: Self-pay

## 2022-11-10 DIAGNOSIS — N130 Hydronephrosis with ureteropelvic junction obstruction: Secondary | ICD-10-CM | POA: Diagnosis not present

## 2022-11-10 NOTE — Patient Outreach (Signed)
Care Coordination   11/10/2022 Name: Laura [name] MRN: [id number] DOB: 1945-09-30   Care Coordination Outreach Attempts:  An unsuccessful telephone outreach was attempted for a scheduled appointment today.  Follow Up Plan:  Additional outreach attempts will be made to offer the patient care coordination information and services.   Encounter Outcome:  No Answer   Care Coordination Interventions:  No, not indicated    [name], RN, MSN, BSN, CCM Care Management Coordinator [phone]

## 2022-11-12 ENCOUNTER — Other Ambulatory Visit: Payer: Self-pay

## 2022-11-12 ENCOUNTER — Emergency Department (HOSPITAL_COMMUNITY)
Admission: EM | Admit: 2022-11-12 | Discharge: 2022-11-12 | Disposition: A | Discharge status: Home / Self Care | Payer: 59 | Source: Home / Self Care | Attending: Emergency Medicine | Admitting: Emergency Medicine

## 2022-11-12 ENCOUNTER — Emergency Department (HOSPITAL_COMMUNITY): Payer: 59

## 2022-11-12 ENCOUNTER — Encounter (HOSPITAL_COMMUNITY): Payer: Self-pay

## 2022-11-12 DIAGNOSIS — I10 Essential (primary) hypertension: Secondary | ICD-10-CM | POA: Insufficient documentation

## 2022-11-12 DIAGNOSIS — Z515 Encounter for palliative care: Secondary | ICD-10-CM | POA: Diagnosis not present

## 2022-11-12 DIAGNOSIS — N136 Pyonephrosis: Secondary | ICD-10-CM | POA: Diagnosis not present

## 2022-11-12 DIAGNOSIS — Z7984 Long term (current) use of oral hypoglycemic drugs: Secondary | ICD-10-CM | POA: Insufficient documentation

## 2022-11-12 DIAGNOSIS — E785 Hyperlipidemia, unspecified: Secondary | ICD-10-CM | POA: Diagnosis not present

## 2022-11-12 DIAGNOSIS — K449 Diaphragmatic hernia without obstruction or gangrene: Secondary | ICD-10-CM | POA: Diagnosis not present

## 2022-11-12 DIAGNOSIS — R590 Localized enlarged lymph nodes: Secondary | ICD-10-CM | POA: Diagnosis not present

## 2022-11-12 DIAGNOSIS — M4316 Spondylolisthesis, lumbar region: Secondary | ICD-10-CM | POA: Diagnosis not present

## 2022-11-12 DIAGNOSIS — Z79899 Other long term (current) drug therapy: Secondary | ICD-10-CM | POA: Insufficient documentation

## 2022-11-12 DIAGNOSIS — C786 Secondary malignant neoplasm of retroperitoneum and peritoneum: Secondary | ICD-10-CM | POA: Diagnosis not present

## 2022-11-12 DIAGNOSIS — Z23 Encounter for immunization: Secondary | ICD-10-CM | POA: Diagnosis not present

## 2022-11-12 DIAGNOSIS — E1122 Type 2 diabetes mellitus with diabetic chronic kidney disease: Secondary | ICD-10-CM | POA: Diagnosis not present

## 2022-11-12 DIAGNOSIS — C569 Malignant neoplasm of unspecified ovary: Secondary | ICD-10-CM | POA: Diagnosis not present

## 2022-11-12 DIAGNOSIS — M25551 Pain in right hip: Secondary | ICD-10-CM | POA: Insufficient documentation

## 2022-11-12 DIAGNOSIS — Z743 Need for continuous supervision: Secondary | ICD-10-CM | POA: Diagnosis not present

## 2022-11-12 DIAGNOSIS — M438X6 Other specified deforming dorsopathies, lumbar region: Secondary | ICD-10-CM | POA: Diagnosis not present

## 2022-11-12 DIAGNOSIS — G893 Neoplasm related pain (acute) (chronic): Secondary | ICD-10-CM | POA: Diagnosis not present

## 2022-11-12 DIAGNOSIS — E1151 Type 2 diabetes mellitus with diabetic peripheral angiopathy without gangrene: Secondary | ICD-10-CM | POA: Diagnosis not present

## 2022-11-12 DIAGNOSIS — N2889 Other specified disorders of kidney and ureter: Secondary | ICD-10-CM | POA: Diagnosis not present

## 2022-11-12 DIAGNOSIS — E876 Hypokalemia: Secondary | ICD-10-CM | POA: Diagnosis not present

## 2022-11-12 DIAGNOSIS — K219 Gastro-esophageal reflux disease without esophagitis: Secondary | ICD-10-CM | POA: Diagnosis not present

## 2022-11-12 DIAGNOSIS — R6889 Other general symptoms and signs: Secondary | ICD-10-CM | POA: Diagnosis not present

## 2022-11-12 DIAGNOSIS — E119 Type 2 diabetes mellitus without complications: Secondary | ICD-10-CM | POA: Insufficient documentation

## 2022-11-12 DIAGNOSIS — G4733 Obstructive sleep apnea (adult) (pediatric): Secondary | ICD-10-CM | POA: Diagnosis not present

## 2022-11-12 DIAGNOSIS — C800 Disseminated malignant neoplasm, unspecified: Secondary | ICD-10-CM | POA: Diagnosis not present

## 2022-11-12 DIAGNOSIS — Z8616 Personal history of COVID-19: Secondary | ICD-10-CM | POA: Diagnosis not present

## 2022-11-12 DIAGNOSIS — N179 Acute kidney failure, unspecified: Secondary | ICD-10-CM | POA: Diagnosis not present

## 2022-11-12 DIAGNOSIS — M5441 Lumbago with sciatica, right side: Secondary | ICD-10-CM | POA: Diagnosis not present

## 2022-11-12 DIAGNOSIS — M47816 Spondylosis without myelopathy or radiculopathy, lumbar region: Secondary | ICD-10-CM | POA: Diagnosis not present

## 2022-11-12 DIAGNOSIS — M545 Low back pain, unspecified: Secondary | ICD-10-CM | POA: Diagnosis not present

## 2022-11-12 DIAGNOSIS — N1330 Unspecified hydronephrosis: Secondary | ICD-10-CM | POA: Diagnosis not present

## 2022-11-12 DIAGNOSIS — Z8249 Family history of ischemic heart disease and other diseases of the circulatory system: Secondary | ICD-10-CM | POA: Diagnosis not present

## 2022-11-12 DIAGNOSIS — Z881 Allergy status to other antibiotic agents status: Secondary | ICD-10-CM | POA: Diagnosis not present

## 2022-11-12 DIAGNOSIS — E1142 Type 2 diabetes mellitus with diabetic polyneuropathy: Secondary | ICD-10-CM | POA: Diagnosis not present

## 2022-11-12 DIAGNOSIS — A419 Sepsis, unspecified organism: Secondary | ICD-10-CM | POA: Diagnosis not present

## 2022-11-12 DIAGNOSIS — I129 Hypertensive chronic kidney disease with stage 1 through stage 4 chronic kidney disease, or unspecified chronic kidney disease: Secondary | ICD-10-CM | POA: Diagnosis not present

## 2022-11-12 DIAGNOSIS — Z66 Do not resuscitate: Secondary | ICD-10-CM | POA: Diagnosis not present

## 2022-11-12 DIAGNOSIS — E871 Hypo-osmolality and hyponatremia: Secondary | ICD-10-CM | POA: Diagnosis not present

## 2022-11-12 DIAGNOSIS — N1832 Chronic kidney disease, stage 3b: Secondary | ICD-10-CM | POA: Diagnosis not present

## 2022-11-12 DIAGNOSIS — R531 Weakness: Secondary | ICD-10-CM | POA: Diagnosis not present

## 2022-11-12 MED ORDER — OXYCODONE-ACETAMINOPHEN 5-325 MG PO TABS
1.0000 | ORAL_TABLET | Freq: Once | ORAL | Status: AC
  Administered 2022-11-12: 1 via ORAL
  Filled 2022-11-12: qty 1

## 2022-11-12 MED ORDER — TRAMADOL HCL 50 MG PO TABS
ORAL_TABLET | ORAL | 0 refills | Status: AC
Start: 2022-11-12 — End: ?

## 2022-11-12 MED ORDER — OXYCODONE-ACETAMINOPHEN 5-325 MG PO TABS
ORAL_TABLET | ORAL | 0 refills | Status: DC
Start: 2022-11-12 — End: 2022-11-12

## 2022-11-12 NOTE — Discharge Instructions (Signed)
Follow-up with your family doctor next week

## 2022-11-12 NOTE — ED Provider Notes (Signed)
[place] EMERGENCY DEPARTMENT AT [facility] Provider Note   CSN: [id number] Arrival date & time: 11/12/22  0720     History  Chief Complaint  Patient presents with   Hip Pain    Laura [name] is a 77 y.o. female.  Patient has a history of diabetes and hypertension and complains of pain rating down her right leg  The history is provided by the patient and medical records. No language interpreter was used.  Hip Pain This is a new problem. The current episode started 2 days ago. The problem occurs constantly. The problem has not changed since onset.Pertinent negatives include no chest pain, no abdominal pain and no headaches. Nothing aggravates the symptoms.       Home Medications Prior to Admission medications   Medication Sig Start Date End Date Taking? Authorizing Provider  oxyCODONE-acetaminophen (PERCOCET/ROXICET) 5-325 MG tablet Take 1 every 6 hours for pain that is not relieved by Tylenol alone 11/12/22  Yes Bethann Berkshire, MD  acetaminophen (TYLENOL) 500 MG tablet Take 1 tablet (500 mg total) by mouth every 6 (six) hours as needed. 05/16/20   Kerrin Champagne, MD  amLODipine (NORVASC) 10 MG tablet Take 1 tablet (10 mg total) by mouth daily. 07/29/22   Doreatha Massed, MD  ASHWAGANDHA PO Take 1 tablet by mouth daily.    [provider]  bismuth subsalicylate (PEPTO BISMOL) 262 MG/15ML suspension Take 30 mLs by mouth every 6 (six) hours as needed for indigestion or diarrhea or loose stools.    [provider]  BLACK CURRANT SEED OIL PO Take 1 capsule by mouth daily.    [provider]  Blood Glucose Monitoring Suppl (ONE TOUCH ULTRA 2) w/Device KIT Use as directed 04/06/15   Corwin Levins, MD  cholecalciferol (VITAMIN D3) 25 MCG (1000 UNIT) tablet Take 2,000 Units by mouth daily.    [provider]  Cyanocobalamin (B-12) 2500 MCG TABS Take 2,500 mcg by mouth daily.    [provider]  docusate sodium (COLACE) 100 MG  capsule Take 2 capsules (200 mg total) by mouth 2 (two) times daily. Patient taking differently: Take 100 mg by mouth daily as needed for moderate constipation. 07/01/22   Carnella Guadalajara, PA-C  famotidine-calcium carbonate-magnesium hydroxide (PEPCID COMPLETE) 10-800-165 MG chewable tablet Chew 1 tablet by mouth daily as needed (acid reflux).    [provider]  furosemide (LASIX) 20 MG tablet TAKE 1 TABLET (20MG ) BY MOUTH DAILYAS NEEDED FOR EDEMA (SWELLING) Patient taking differently: Take 20 mg by mouth daily. 09/28/22   Carnella Guadalajara, PA-C  glimepiride (AMARYL) 2 MG tablet TAKE 1/2 TABLET (1MG ) BY MOUTH ONCE DAILY BEFORE BREAKFAST 08/21/22   Corwin Levins, MD  glucose blood [facility] ULTRA) test strip USE TO CHECK BLOOD SUGARS TWO TIMES DAILY 03/22/21   Corwin Levins, MD  HYDROmorphone (DILAUDID) 4 MG tablet Take 4 mg by mouth every 4 (four) hours as needed for severe pain (PRN as needed.).    [provider]  Lancets [facility] ULTRASOFT) lancets 1 each by Other route as needed for other. Use as instructed 03/20/21   Corwin Levins, MD  lidocaine (LIDODERM) 5 % Place 1 patch onto the skin daily. Remove & Discard patch within 12 hours or as directed by MD Patient taking differently: Place 1-2 patches onto the skin daily as needed (pain). Remove & Discard patch within 12 hours or as directed by MD 09/01/22   Doreatha Massed, MD  loperamide (  IMODIUM A-D) 2 MG tablet Take 2-6 mg by mouth as needed for diarrhea or loose stools.    [provider]  loratadine (CLARITIN) 10 MG tablet Take 10 mg by mouth daily.    [provider]  megestrol (MEGACE) 400 MG/10ML suspension Take 10 mLs (400 mg total) by mouth 2 (two) times daily. Patient taking differently: Take 400 mg by mouth 2 (two) times daily as needed (appetite). 09/01/22   Doreatha Massed, MD  polyethylene glycol (MIRALAX / GLYCOLAX) 17 g packet Take 17 g by mouth daily as needed for moderate  constipation.    [provider]  prochlorperazine (COMPAZINE) 10 MG tablet Take 1 tablet (10 mg total) by mouth every 6 (six) hours as needed for nausea or vomiting. 07/14/22   Doreatha Massed, MD  solifenacin (VESICARE) 5 MG tablet Take 1 tablet (5 mg total) by mouth daily. Patient not taking: Reported on 10/15/2022 02/14/22   Corwin Levins, MD  solifenacin (VESICARE) 5 MG tablet Take 1 tablet (5 mg total) by mouth daily. 10/13/22     traZODone (DESYREL) 100 MG tablet 1/2 - 1 tab by mouth at bedtime for sleep as needed Patient taking differently: Take 50 mg by mouth at bedtime as needed for sleep. 08/26/22   Corwin Levins, MD  Wheat Dextrin (BENEFIBER) CHEW Chew 1 tablet by mouth daily as needed (constipation).    [provider]      Allergies    Ciprofloxacin, Hydrocodone bit-homatrop mbr, Prednisone, Sulfa antibiotics, Alfuzosin, Codeine, Crestor [rosuvastatin calcium], Doxycycline, Gabapentin, Keflex [cephalexin], Myrbetriq [mirabegron er], Naproxen, and Statins    Review of Systems   Review of Systems  Constitutional:  Negative for appetite change and fatigue.  HENT:  Negative for congestion, ear discharge and sinus pressure.   Eyes:  Negative for discharge.  Respiratory:  Negative for cough.   Cardiovascular:  Negative for chest pain.  Gastrointestinal:  Negative for abdominal pain and diarrhea.  Genitourinary:  Negative for frequency and hematuria.  Musculoskeletal:  Negative for back pain.       Pain in right leg  Skin:  Negative for rash.  Neurological:  Negative for seizures and headaches.  Psychiatric/Behavioral:  Negative for hallucinations.     Physical Exam Updated Vital Signs BP 139/66   Pulse 90   Temp 98.5 F (36.9 C) (Oral)   Resp 17   Ht 5\' 3"  (1.6 m)   Wt 82.1 kg   SpO2 99%   BMI 32.06 kg/m  Physical Exam Vitals and nursing note reviewed.  Constitutional:      Appearance: She is well-developed.  HENT:     Head: Normocephalic.      Nose: Nose normal.  Eyes:     General: No scleral icterus.    Conjunctiva/sclera: Conjunctivae normal.  Neck:     Thyroid: No thyromegaly.  Cardiovascular:     Rate and Rhythm: Normal rate and regular rhythm.     Heart sounds: No murmur heard.    No friction rub. No gallop.  Pulmonary:     Breath sounds: No stridor. No wheezing or rales.  Chest:     Chest wall: No tenderness.  Abdominal:     General: There is no distension.     Tenderness: There is no abdominal tenderness. There is no rebound.  Musculoskeletal:        General: Normal range of motion.     Cervical back: Neck supple.     Comments: Positive straight leg raise on  the right  Lymphadenopathy:     Cervical: No cervical adenopathy.  Skin:    Findings: No erythema or rash.  Neurological:     Mental Status: She is alert and oriented to person, place, and time.     Motor: No abnormal muscle tone.     Coordination: Coordination normal.  Psychiatric:        Behavior: Behavior normal.     ED Results / Procedures / Treatments   Labs (all labs ordered are listed, but only abnormal results are displayed) Labs Reviewed - No data to display  EKG None  Radiology DG Lumbar Spine Complete  Result Date: 11/12/2022 CLINICAL DATA:  Low back pain.  Right hip/leg pain. EXAM: LUMBAR SPINE - COMPLETE 4+ VIEW COMPARISON:  None Available. FINDINGS: There are 5 nonrib-bearing lumbar vertebrae. Anatomic lumbar curvature. No spondylolysis. There is grade 1 anterolisthesis of L4 over L5, similar to the prior study. Vertebral body heights are maintained. No aggressive osseous lesion. Mild-moderate multilevel degenerative changes in the form of reduced intervertebral disc height, endplate sclerosis/irregularity, facet arthropathy and marginal osteophyte formation. Sacroiliac joints are symmetric. Visualized soft tissues are within normal limits. There are surgical clips in the right upper quadrant, typical of a previous cholecystectomy.  IMPRESSION: *No acute osseous abnormality of the lumbar spine. *Mild-to-moderate multilevel degenerative changes, as described above. Electronically Signed   By: Jules Schick M.D.   On: 11/12/2022 08:44    Procedures Procedures    Medications Ordered in ED Medications  oxyCODONE-acetaminophen (PERCOCET/ROXICET) 5-325 MG per tablet 1 tablet (1 tablet Oral Given 11/12/22 7829)    ED Course/ Medical Decision Making/ A&P                                 Medical Decision Making Amount and/or Complexity of Data Reviewed Radiology: ordered.  Risk Prescription drug management.   Patient with sciatica pain on the right.  She is put on prednisone and will follow-up with PCP        Final Clinical Impression(s) / ED Diagnoses Final diagnoses:  Right hip pain    Rx / DC Orders ED Discharge Orders          Ordered    oxyCODONE-acetaminophen (PERCOCET/ROXICET) 5-325 MG tablet        11/12/22 1002              Bethann Berkshire, MD 11/12/22 1733

## 2022-11-12 NOTE — ED Triage Notes (Signed)
Pt to er room number 3, per ems they were originally called for low blood sugar, states that pt said her meter was reading 0, states that they got 211, states that pt also reports some R hip/leg pain.  States that she has had the pain for the last few days, states that she has hx of ovarian cancer on the same side.

## 2022-11-13 ENCOUNTER — Ambulatory Visit: Payer: 59 | Admitting: Family Medicine

## 2022-11-13 VITALS — BP 124/66 | HR 90 | Temp 97.6°F | Ht 63.0 in

## 2022-11-13 DIAGNOSIS — Z23 Encounter for immunization: Secondary | ICD-10-CM

## 2022-11-13 DIAGNOSIS — M5441 Lumbago with sciatica, right side: Secondary | ICD-10-CM

## 2022-11-13 DIAGNOSIS — C800 Disseminated malignant neoplasm, unspecified: Secondary | ICD-10-CM | POA: Diagnosis not present

## 2022-11-13 DIAGNOSIS — C569 Malignant neoplasm of unspecified ovary: Secondary | ICD-10-CM | POA: Diagnosis not present

## 2022-11-13 MED ORDER — KETOROLAC TROMETHAMINE 60 MG/2ML IM SOLN
60.0000 mg | Freq: Once | INTRAMUSCULAR | Status: AC
Start: 2022-11-13 — End: 2022-11-13
  Administered 2022-11-13: 60 mg via INTRAMUSCULAR

## 2022-11-13 NOTE — Patient Instructions (Addendum)
For the rest of today:   Take Tylenol 1,000 mg every 8 hours.   Use the Lidocaine patches.   Take Tramadol if your pain is not improved with Tylenol.   If you still have severe pain- you can take Dilaudid but not with the Tramadol.   Be aware these medications can be sedating and avoid taking too much   You should have someone stay with you and help you when you are walking around to avoid falling.   You may add in meloxicam tomorrow if needed for pain.

## 2022-11-13 NOTE — Progress Notes (Signed)
Subjective:     Patient ID: Laura [name], female    DOB: 08-10-1945, 77 y.o.   MRN: [id number]  Chief Complaint  Patient presents with   Hip Pain    Right hip pain, pain keeps intensifying went to ER and they gave her medication (tramadol) and it doesn't help.  Has been through chemo therapy and has a blocked kidney.     Hip Pain      History of Present Illness         C/o right low back pain that radiates down her right leg. Her son is with her.   Seen in the [facility] ED yesterday for this pain. X ray LS done.   She has taken meloxicam and Tramadol today without any relief.   States she has Dilaudid at home but has not taken it. States she does ok with it.   She reports having a shot of pain medication the last time she had this pain and it helped.   States she cannot take Oxycodone or hydrocodone because it makes her "out of her mind".   States she has lidocaine patches at home.   Denies falling. Denies feeling like her hip will give away.  Denies fever, chills, dizziness, chest pain, palpitations, shortness of breath, abdominal pain, urinary symptoms. No loss of control of bowels or bladder.        Health Maintenance Due  Topic Date Due   Zoster Vaccines- Shingrix (1 of 2) Never done   Diabetic kidney evaluation - Urine ACR  03/06/2022   FOOT EXAM  03/06/2022   Medicare Annual Wellness (AWV)  06/18/2022    Past Medical History:  Diagnosis Date   Anemia    Anxiety    Arthritis    back of neck, bones spurs on neck   Cancer (HCC)    ovarian cancer   Cataract both eyes    Cervical disc disease    Diabetes mellitus Type 2    Dyspnea    with exertion   Family history of adverse reaction to anesthesia    sister slow to awaken   Family history of thyroid cancer    GERD (gastroesophageal reflux disease)    Heart murmur    History of COVID-19 06/2020   pain in side took paxlovid x 5 days all symptoms resolved   History of radiation therapy     abdominal lymph nodes 01/21/2021-02/01/2021  Dr Antony Blackbird   Hydronephrosis 08/24/2020   Right Kidney (stable per CT)   Hyperlipidemia    Hypertension    IBS (irritable bowel syndrome)    Left shoulder frozen with limited rom    Mucoid cyst of joint    right thumb   Neuropathy 03/12/2021   hands/fingers both hands numb and tingle @ times   Peripheral vascular disease (HCC)    Port-A-Cath in place 07/21/2019   Reflux    Sleep apnea 03/12/2021   has not used cpap in 3 years   Vertigo 12/2010   none since treated at [facility]   Wears glasses for reading    Wears partial dentures upper     Past Surgical History:  Procedure Laterality Date   BLADDER SURGERY     x 3 at [facility]   BREAST EXCISIONAL BIOPSY Bilateral    BREAST SURGERY     fibroid cyst removed ? over 10 yrs ago at [facility] per pt on 03-12-2021   CHOLECYSTECTOMY     yrs ago  COLONOSCOPY  07/02/2020   and 09-19-2016   CYSTOSCOPY W/ URETERAL STENT PLACEMENT Right 03/13/2021   Procedure: CYSTOSCOPY WITH RETROGRADE PYELOGRAM/URETERAL STENT PLACEMENT;  Surgeon: Noel Christmas, MD;  Location: [facility];  Service: Urology;  Laterality: Right;  30 MINS   CYSTOSCOPY W/ URETERAL STENT PLACEMENT Right 10/01/2021   Procedure: CYSTOSCOPY WITH RETROGRADE PYELOGRAM/URETERAL STENT REPLACEMENT;  Surgeon: Noel Christmas, MD;  Location: [facility];  Service: Urology;  Laterality: Right;   CYSTOSCOPY W/ URETERAL STENT PLACEMENT Bilateral 03/18/2022   Procedure: CYSTOSCOPY WITH BILATERAL RETROGRADE PYELOGRAM/RIGHT URETERAL STENT EXCHANGE;  Surgeon: Noel Christmas, MD;  Location: [facility];  Service: Urology;  Laterality: Bilateral;  1HR   CYSTOSCOPY WITH RETROGRADE PYELOGRAM, URETEROSCOPY AND STENT PLACEMENT Right 05/06/2022   Procedure: CYSTOSCOPY WITH RIGHT RETROGRADE PYELOGRAM, AND RIGHT URETERAL STENT EXCHANGE;  Surgeon: Noel Christmas, MD;  Location: [facility];  Service: Urology;  Laterality: Right;   CYSTOSCOPY WITH  RETROGRADE PYELOGRAM, URETEROSCOPY AND STENT PLACEMENT Right 10/21/2022   Procedure: CYSTOSCOPY, RIGHT  RETROGRADE PYELOGRAM AND RIGHT STENT REMOVAL;  Surgeon: Noel Christmas, MD;  Location: [facility];  Service: Urology;  Laterality: Right;   fibroids removed     breast (both breasts) age 54 and total of 3 surgeries   history of chemotherapy     6 rounds june 2021   IR IMAGING GUIDED PORT INSERTION  07/26/2019   right   MASS EXCISION Right 06/26/2016   Procedure: EXCISION MUCOID TUMOR RIGHT THUMB, IP RIGHT THUMB;  Surgeon: Cindee Salt, MD;  Location: [facility];  Service: Orthopedics;  Laterality: Right;   ROBOTIC ASSISTED BILATERAL SALPINGO OOPHERECTOMY N/A 01/03/2020   Procedure: XI ROBOTIC ASSISTED BILATERAL SALPINGO OOPHORECTOMY, RADICAL TUMOR DEBULKING;  Surgeon: Adolphus Birchwood, MD;  Location: [facility];  Service: Gynecology;  Laterality: N/A;   ROBOTIC PELVIC AND PARA-AORTIC LYMPH NODE DISSECTION N/A 01/03/2020   Procedure: XI ROBOTIC PARA-AORTIC LYMPHADENECTOMY;  Surgeon: Adolphus Birchwood, MD;  Location: [facility];  Service: Gynecology;  Laterality: N/A;   VAGINAL HYSTERECTOMY     age late 71's    Family History  Problem Relation Age of Onset   Hypertension Mother    Diabetes Father    Hypertension Father    Diabetes Sister    Breast cancer Sister        stage 0   Hypertension Sister    Stroke Sister    Diabetes Maternal Aunt    Thyroid cancer Daughter 78   Hypertension Other    Colon cancer Neg Hx    Esophageal cancer Neg Hx    Stomach cancer Neg Hx    Rectal cancer Neg Hx    Endometrial cancer Neg Hx    Ovarian cancer Neg Hx     Social History   Socioeconomic History   Marital status: Single    Spouse name: Not on file   Number of children: 4   Years of education: 16   Highest education level: Bachelor's degree (e.g., BA, AB, BS)  Occupational History   Occupation: retired Photographer  Tobacco Use   Smoking status: Never   Smokeless tobacco:  Never  Vaping Use   Vaping status: Never Used  Substance and Sexual Activity   Alcohol use: No    Alcohol/week: 0.0 standard drinks of alcohol   Drug use: No   Sexual activity: Not Currently    Birth control/protection: Surgical  Other Topics Concern   Not on file  Social History Narrative  Not on file   Social Determinants of Health   Financial Resource Strain: Low Risk  (11/13/2022)   Overall Financial Resource Strain (CARDIA)    Difficulty of Paying Living Expenses: Not hard at all  Food Insecurity: No Food Insecurity (11/13/2022)   Hunger Vital Sign    Worried About Running Out of Food in the Last Year: Never true    Ran Out of Food in the Last Year: Never true  Transportation Needs: No Transportation Needs (11/13/2022)   PRAPARE - Administrator, Civil Service (Medical): No    Lack of Transportation (Non-Medical): No  Physical Activity: Unknown (11/13/2022)   Exercise Vital Sign    Days of Exercise per Week: 0 days    Minutes of Exercise per Session: Not on file  Stress: Stress Concern Present (11/13/2022)   [employer] of Occupational Health - Occupational Stress Questionnaire    Feeling of Stress : Rather much  Social Connections: Moderately Integrated (11/13/2022)   Social Connection and Isolation Panel [NHANES]    Frequency of Communication with Friends and Family: More than three times a week    Frequency of Social Gatherings with Friends and Family: More than three times a week    Attends Religious Services: More than 4 times per year    Active Member of [employer] or Organizations: Yes    Attends Banker Meetings: 1 to 4 times per year    Marital Status: Divorced  Intimate Partner Violence: Not At Risk (06/15/2022)   Humiliation, Afraid, Rape, and Kick questionnaire    Fear of Current or Ex-Partner: No    Emotionally Abused: No    Physically Abused: No    Sexually Abused: No    Outpatient Medications Prior to Visit  Medication Sig  Dispense Refill   acetaminophen (TYLENOL) 500 MG tablet Take 1 tablet (500 mg total) by mouth every 6 (six) hours as needed. 30 tablet 0   amLODipine (NORVASC) 10 MG tablet Take 1 tablet (10 mg total) by mouth daily. 30 tablet 3   ASHWAGANDHA PO Take 1 tablet by mouth daily.     bismuth subsalicylate (PEPTO BISMOL) 262 MG/15ML suspension Take 30 mLs by mouth every 6 (six) hours as needed for indigestion or diarrhea or loose stools.     BLACK CURRANT SEED OIL PO Take 1 capsule by mouth daily.     Blood Glucose Monitoring Suppl (ONE TOUCH ULTRA 2) w/Device KIT Use as directed 1 each 0   cholecalciferol (VITAMIN D3) 25 MCG (1000 UNIT) tablet Take 2,000 Units by mouth daily.     Cyanocobalamin (B-12) 2500 MCG TABS Take 2,500 mcg by mouth daily.     docusate sodium (COLACE) 100 MG capsule Take 2 capsules (200 mg total) by mouth 2 (two) times daily. (Patient taking differently: Take 100 mg by mouth daily as needed for moderate constipation.) 100 capsule 3   famotidine-calcium carbonate-magnesium hydroxide (PEPCID COMPLETE) 10-800-165 MG chewable tablet Chew 1 tablet by mouth daily as needed (acid reflux).     furosemide (LASIX) 20 MG tablet TAKE 1 TABLET (20MG ) BY MOUTH DAILYAS NEEDED FOR EDEMA (SWELLING) (Patient taking differently: Take 20 mg by mouth daily.) 90 tablet 1   glimepiride (AMARYL) 2 MG tablet TAKE 1/2 TABLET (1MG ) BY MOUTH ONCE DAILY BEFORE BREAKFAST 45 tablet 7   glucose blood (ONETOUCH ULTRA) test strip USE TO CHECK BLOOD SUGARS TWO TIMES DAILY 100 strip 3   HYDROmorphone (DILAUDID) 4 MG tablet Take 4 mg by mouth every  4 (four) hours as needed for severe pain (PRN as needed.).     Lancets (ONETOUCH ULTRASOFT) lancets 1 each by Other route as needed for other. Use as instructed 100 each 3   lidocaine (LIDODERM) 5 % Place 1 patch onto the skin daily. Remove & Discard patch within 12 hours or as directed by MD (Patient taking differently: Place 1-2 patches onto the skin daily as needed  (pain). Remove & Discard patch within 12 hours or as directed by MD) 30 patch 3   loperamide (IMODIUM A-D) 2 MG tablet Take 2-6 mg by mouth as needed for diarrhea or loose stools.     loratadine (CLARITIN) 10 MG tablet Take 10 mg by mouth daily.     polyethylene glycol (MIRALAX / GLYCOLAX) 17 g packet Take 17 g by mouth daily as needed for moderate constipation.     prochlorperazine (COMPAZINE) 10 MG tablet Take 1 tablet (10 mg total) by mouth every 6 (six) hours as needed for nausea or vomiting. 30 tablet 2   solifenacin (VESICARE) 5 MG tablet Take 1 tablet (5 mg total) by mouth daily. 90 tablet 1   traMADol (ULTRAM) 50 MG tablet Take 1 every 6 hours for pain not relieved by Tylenol alone 20 tablet 0   traZODone (DESYREL) 100 MG tablet 1/2 - 1 tab by mouth at bedtime for sleep as needed (Patient taking differently: Take 50 mg by mouth at bedtime as needed for sleep.) 90 tablet 1   Wheat Dextrin (BENEFIBER) CHEW Chew 1 tablet by mouth daily as needed (constipation).     megestrol (MEGACE) 400 MG/10ML suspension Take 10 mLs (400 mg total) by mouth 2 (two) times daily. (Patient taking differently: Take 400 mg by mouth 2 (two) times daily as needed (appetite).) 480 mL 3   solifenacin (VESICARE) 5 MG tablet Take 1 tablet (5 mg total) by mouth daily. (Patient not taking: Reported on 10/15/2022) 90 tablet 3   No facility-administered medications prior to visit.    Allergies  Allergen Reactions   Ciprofloxacin Swelling    Torn tendon   Hydrocodone Bit-Homatrop Mbr Other (See Comments)    Vertigo *pt strongly prefers to never take* took 3 years to recover from   Prednisone Anxiety    *pt strongly prefers to never be given prednisone*    Sulfa Antibiotics Hives, Itching and Swelling    Tongue swells   Alfuzosin Other (See Comments)    Weakness and fatigue   Codeine Itching   Crestor [Rosuvastatin Calcium] Other (See Comments)    Did something to memory     Doxycycline Other (See Comments)     Severe rectal Gas.   Gabapentin     disoriented   Keflex [Cephalexin] Diarrhea and Nausea And Vomiting   Myrbetriq Theodosia Paling Er] Swelling    Swelling of legs and feet   Naproxen Other (See Comments)    Stomach cramps/loud gas   Statins     Muscle weakness    Review of Systems  Constitutional:  Negative for chills and fever.  Respiratory:  Negative for shortness of breath.   Cardiovascular:  Negative for chest pain, palpitations and leg swelling.  Gastrointestinal:  Positive for nausea and vomiting. Negative for abdominal pain, constipation and diarrhea.       Intermittent N/V/D which is not new  Genitourinary:  Negative for dysuria, frequency and urgency.  Musculoskeletal:  Positive for back pain and joint pain. Negative for falls and neck pain.  Skin:  Negative for rash.  Neurological:  Negative for dizziness, focal weakness and headaches.       Objective:    Physical Exam Constitutional:      General: She is not in acute distress.    Appearance: She is not ill-appearing.  Eyes:     Extraocular Movements: Extraocular movements intact.     Conjunctiva/sclera: Conjunctivae normal.  Cardiovascular:     Rate and Rhythm: Normal rate.  Pulmonary:     Effort: Pulmonary effort is normal.  Musculoskeletal:     Cervical back: Normal range of motion and neck supple.     Thoracic back: Normal.     Lumbar back: Tenderness present. Decreased range of motion. Negative right straight leg raise test and negative left straight leg raise test.     Comments: TTP over right SI joint. Able to ambulate with difficulty and pain reproduced. RLE is neurovascularly intact   Skin:    General: Skin is warm and dry.  Neurological:     General: No focal deficit present.     Mental Status: She is alert and oriented to person, place, and time.  Psychiatric:        Mood and Affect: Mood normal.        Behavior: Behavior normal.        Thought Content: Thought content normal.      BP 124/66  (BP Location: Left Arm, Patient Position: Sitting, Cuff Size: Large)   Pulse 90   Temp 97.6 F (36.4 C) (Temporal)   Ht 5\' 3"  (1.6 m)   SpO2 97%   BMI 32.06 kg/m  Wt Readings from Last 3 Encounters:  11/12/22 181 lb (82.1 kg)  10/21/22 181 lb (82.1 kg)  09/14/22 181 lb 6.4 oz (82.3 kg)       Assessment & Plan:   Problem List Items Addressed This Visit       Endocrine   Primary cancer of ovary with widespread metastatic disease (HCC)     Other   Back pain - Primary   Other Visit Diagnoses     Need for influenza vaccination       Relevant Orders   Flu Vaccine Trivalent High Dose (Fluad) (Completed)      Reviewed CT and X ray of lumbar spine.  She has metastatic disease which may be contributing to her pain. Sciatica present.  Toradol 60 mg IM injection given.  Counseling on pain control without oversedating herself or increasing fall risk.  Her son is with her and states someone can stay in the house with her.  Recommend Tylenol, lidocaine patches and Tramadol if still having severe pain.  She also has Dilaudid at home and may take if needed but advised to avoid taking this with Tramadol.    I have discontinued Zelphia Cairo. Viall's megestrol. I am also having her maintain her ONE TOUCH ULTRA 2, loratadine, acetaminophen, onetouch ultrasoft, OneTouch Ultra, Benefiber, docusate sodium, polyethylene glycol, prochlorperazine, amLODipine, glimepiride, traZODone, HYDROmorphone, lidocaine, furosemide, solifenacin, cholecalciferol, B-12, famotidine-calcium carbonate-magnesium hydroxide, loperamide, bismuth subsalicylate, BLACK CURRANT SEED OIL PO, ASHWAGANDHA PO, and traMADol. We administered ketorolac.  Meds ordered this encounter  Medications   ketorolac (TORADOL) injection 60 mg

## 2022-11-15 ENCOUNTER — Encounter (HOSPITAL_COMMUNITY): Payer: Self-pay | Admitting: Internal Medicine

## 2022-11-15 ENCOUNTER — Other Ambulatory Visit: Payer: Self-pay

## 2022-11-15 ENCOUNTER — Inpatient Hospital Stay (HOSPITAL_COMMUNITY)
Admission: EM | Admit: 2022-11-15 | Discharge: 2022-11-21 | DRG: 947 | Disposition: A | Discharge status: Hospice | Payer: 59 | Attending: Family Medicine | Admitting: Family Medicine

## 2022-11-15 ENCOUNTER — Emergency Department (HOSPITAL_COMMUNITY): Payer: 59

## 2022-11-15 DIAGNOSIS — G893 Neoplasm related pain (acute) (chronic): Secondary | ICD-10-CM | POA: Diagnosis present

## 2022-11-15 DIAGNOSIS — T402X5A Adverse effect of other opioids, initial encounter: Secondary | ICD-10-CM | POA: Diagnosis present

## 2022-11-15 DIAGNOSIS — N136 Pyonephrosis: Secondary | ICD-10-CM | POA: Diagnosis present

## 2022-11-15 DIAGNOSIS — A419 Sepsis, unspecified organism: Secondary | ICD-10-CM | POA: Diagnosis not present

## 2022-11-15 DIAGNOSIS — C799 Secondary malignant neoplasm of unspecified site: Secondary | ICD-10-CM | POA: Diagnosis not present

## 2022-11-15 DIAGNOSIS — C569 Malignant neoplasm of unspecified ovary: Secondary | ICD-10-CM | POA: Diagnosis present

## 2022-11-15 DIAGNOSIS — Z7984 Long term (current) use of oral hypoglycemic drugs: Secondary | ICD-10-CM | POA: Diagnosis not present

## 2022-11-15 DIAGNOSIS — M47812 Spondylosis without myelopathy or radiculopathy, cervical region: Secondary | ICD-10-CM | POA: Diagnosis present

## 2022-11-15 DIAGNOSIS — R682 Dry mouth, unspecified: Secondary | ICD-10-CM | POA: Diagnosis present

## 2022-11-15 DIAGNOSIS — E876 Hypokalemia: Secondary | ICD-10-CM | POA: Diagnosis present

## 2022-11-15 DIAGNOSIS — Z66 Do not resuscitate: Secondary | ICD-10-CM | POA: Diagnosis present

## 2022-11-15 DIAGNOSIS — Z8249 Family history of ischemic heart disease and other diseases of the circulatory system: Secondary | ICD-10-CM | POA: Diagnosis not present

## 2022-11-15 DIAGNOSIS — E1142 Type 2 diabetes mellitus with diabetic polyneuropathy: Secondary | ICD-10-CM | POA: Diagnosis present

## 2022-11-15 DIAGNOSIS — E66811 Obesity, class 1: Secondary | ICD-10-CM | POA: Diagnosis present

## 2022-11-15 DIAGNOSIS — N289 Disorder of kidney and ureter, unspecified: Secondary | ICD-10-CM | POA: Diagnosis not present

## 2022-11-15 DIAGNOSIS — N1339 Other hydronephrosis: Principal | ICD-10-CM

## 2022-11-15 DIAGNOSIS — R011 Cardiac murmur, unspecified: Secondary | ICD-10-CM | POA: Diagnosis present

## 2022-11-15 DIAGNOSIS — K5903 Drug induced constipation: Secondary | ICD-10-CM | POA: Diagnosis present

## 2022-11-15 DIAGNOSIS — Z885 Allergy status to narcotic agent status: Secondary | ICD-10-CM

## 2022-11-15 DIAGNOSIS — N1330 Unspecified hydronephrosis: Secondary | ICD-10-CM | POA: Diagnosis present

## 2022-11-15 DIAGNOSIS — E1122 Type 2 diabetes mellitus with diabetic chronic kidney disease: Secondary | ICD-10-CM | POA: Diagnosis present

## 2022-11-15 DIAGNOSIS — C786 Secondary malignant neoplasm of retroperitoneum and peritoneum: Secondary | ICD-10-CM | POA: Diagnosis present

## 2022-11-15 DIAGNOSIS — Z8616 Personal history of COVID-19: Secondary | ICD-10-CM | POA: Diagnosis not present

## 2022-11-15 DIAGNOSIS — E785 Hyperlipidemia, unspecified: Secondary | ICD-10-CM | POA: Diagnosis present

## 2022-11-15 DIAGNOSIS — E1151 Type 2 diabetes mellitus with diabetic peripheral angiopathy without gangrene: Secondary | ICD-10-CM | POA: Diagnosis present

## 2022-11-15 DIAGNOSIS — G4700 Insomnia, unspecified: Secondary | ICD-10-CM | POA: Diagnosis present

## 2022-11-15 DIAGNOSIS — I129 Hypertensive chronic kidney disease with stage 1 through stage 4 chronic kidney disease, or unspecified chronic kidney disease: Secondary | ICD-10-CM | POA: Diagnosis present

## 2022-11-15 DIAGNOSIS — F419 Anxiety disorder, unspecified: Secondary | ICD-10-CM | POA: Diagnosis present

## 2022-11-15 DIAGNOSIS — N179 Acute kidney failure, unspecified: Secondary | ICD-10-CM | POA: Diagnosis present

## 2022-11-15 DIAGNOSIS — E119 Type 2 diabetes mellitus without complications: Secondary | ICD-10-CM

## 2022-11-15 DIAGNOSIS — K589 Irritable bowel syndrome without diarrhea: Secondary | ICD-10-CM | POA: Diagnosis present

## 2022-11-15 DIAGNOSIS — R600 Localized edema: Secondary | ICD-10-CM | POA: Diagnosis present

## 2022-11-15 DIAGNOSIS — G4733 Obstructive sleep apnea (adult) (pediatric): Secondary | ICD-10-CM | POA: Diagnosis present

## 2022-11-15 DIAGNOSIS — Z882 Allergy status to sulfonamides status: Secondary | ICD-10-CM

## 2022-11-15 DIAGNOSIS — M549 Dorsalgia, unspecified: Secondary | ICD-10-CM | POA: Diagnosis not present

## 2022-11-15 DIAGNOSIS — N1832 Chronic kidney disease, stage 3b: Secondary | ICD-10-CM | POA: Diagnosis present

## 2022-11-15 DIAGNOSIS — Z6832 Body mass index (BMI) 32.0-32.9, adult: Secondary | ICD-10-CM | POA: Diagnosis not present

## 2022-11-15 DIAGNOSIS — E871 Hypo-osmolality and hyponatremia: Secondary | ICD-10-CM | POA: Diagnosis present

## 2022-11-15 DIAGNOSIS — Z7189 Other specified counseling: Secondary | ICD-10-CM | POA: Diagnosis not present

## 2022-11-15 DIAGNOSIS — Z881 Allergy status to other antibiotic agents status: Secondary | ICD-10-CM

## 2022-11-15 DIAGNOSIS — Z833 Family history of diabetes mellitus: Secondary | ICD-10-CM

## 2022-11-15 DIAGNOSIS — Z90710 Acquired absence of both cervix and uterus: Secondary | ICD-10-CM

## 2022-11-15 DIAGNOSIS — Z515 Encounter for palliative care: Secondary | ICD-10-CM | POA: Diagnosis not present

## 2022-11-15 DIAGNOSIS — Z79899 Other long term (current) drug therapy: Secondary | ICD-10-CM

## 2022-11-15 DIAGNOSIS — Z9221 Personal history of antineoplastic chemotherapy: Secondary | ICD-10-CM

## 2022-11-15 DIAGNOSIS — Z803 Family history of malignant neoplasm of breast: Secondary | ICD-10-CM

## 2022-11-15 DIAGNOSIS — K219 Gastro-esophageal reflux disease without esophagitis: Secondary | ICD-10-CM | POA: Diagnosis present

## 2022-11-15 DIAGNOSIS — Z923 Personal history of irradiation: Secondary | ICD-10-CM

## 2022-11-15 DIAGNOSIS — K449 Diaphragmatic hernia without obstruction or gangrene: Secondary | ICD-10-CM | POA: Diagnosis not present

## 2022-11-15 DIAGNOSIS — R590 Localized enlarged lymph nodes: Secondary | ICD-10-CM | POA: Diagnosis not present

## 2022-11-15 DIAGNOSIS — N2889 Other specified disorders of kidney and ureter: Secondary | ICD-10-CM | POA: Diagnosis not present

## 2022-11-15 DIAGNOSIS — Z791 Long term (current) use of non-steroidal anti-inflammatories (NSAID): Secondary | ICD-10-CM

## 2022-11-15 DIAGNOSIS — M545 Low back pain, unspecified: Secondary | ICD-10-CM | POA: Diagnosis present

## 2022-11-15 LAB — URINALYSIS, ROUTINE W REFLEX MICROSCOPIC
Bilirubin Urine: NEGATIVE
Glucose, UA: NEGATIVE mg/dL
Ketones, ur: NEGATIVE mg/dL
Nitrite: NEGATIVE
Protein, ur: NEGATIVE mg/dL
Specific Gravity, Urine: 1.005 (ref 1.005–1.030)
WBC, UA: >50 WBC/hpf (ref 0–5)
pH: 6 (ref 5.0–8.0)

## 2022-11-15 LAB — BASIC METABOLIC PANEL WITH GFR
Anion gap: 9 (ref 5–15)
BUN: 38 mg/dL — ABNORMAL HIGH (ref 8–23)
CO2: 20 mmol/L — ABNORMAL LOW (ref 22–32)
Calcium: 9.1 mg/dL (ref 8.9–10.3)
Chloride: 98 mmol/L (ref 98–111)
Creatinine, Ser: 1.91 mg/dL — ABNORMAL HIGH (ref 0.44–1.00)
GFR, Estimated: 27 mL/min — ABNORMAL LOW
Glucose, Bld: 165 mg/dL — ABNORMAL HIGH (ref 70–99)
Potassium: 3.8 mmol/L (ref 3.5–5.1)
Sodium: 127 mmol/L — ABNORMAL LOW (ref 135–145)

## 2022-11-15 LAB — CBC WITH DIFFERENTIAL/PLATELET
Abs Immature Granulocytes: 0.08 K/uL — ABNORMAL HIGH (ref 0.00–0.07)
Basophils Absolute: 0 K/uL (ref 0.0–0.1)
Basophils Relative: 0 %
Eosinophils Absolute: 0.1 K/uL (ref 0.0–0.5)
Eosinophils Relative: 0 %
HCT: 30.4 % — ABNORMAL LOW (ref 36.0–46.0)
Hemoglobin: 10.3 g/dL — ABNORMAL LOW (ref 12.0–15.0)
Immature Granulocytes: 1 %
Lymphocytes Relative: 7 %
Lymphs Abs: 0.9 K/uL (ref 0.7–4.0)
MCH: 31 pg (ref 26.0–34.0)
MCHC: 33.9 g/dL (ref 30.0–36.0)
MCV: 91.6 fL (ref 80.0–100.0)
Monocytes Absolute: 1.1 K/uL — ABNORMAL HIGH (ref 0.1–1.0)
Monocytes Relative: 9 %
Neutro Abs: 10.2 K/uL — ABNORMAL HIGH (ref 1.7–7.7)
Neutrophils Relative %: 83 %
Platelets: 206 K/uL (ref 150–400)
RBC: 3.32 MIL/uL — ABNORMAL LOW (ref 3.87–5.11)
RDW: 15.9 % — ABNORMAL HIGH (ref 11.5–15.5)
WBC: 12.3 K/uL — ABNORMAL HIGH (ref 4.0–10.5)
nRBC: 0 % (ref 0.0–0.2)

## 2022-11-15 MED ORDER — SODIUM CHLORIDE 0.9 % IV SOLN
1.0000 g | Freq: Once | INTRAVENOUS | Status: AC
  Administered 2022-11-15: 1 g via INTRAVENOUS
  Filled 2022-11-15: qty 10

## 2022-11-15 MED ORDER — FENTANYL CITRATE PF 50 MCG/ML IJ SOSY
12.5000 ug | PREFILLED_SYRINGE | INTRAMUSCULAR | Status: DC | PRN
Start: 2022-11-15 — End: 2022-11-17
  Administered 2022-11-15 – 2022-11-16 (×4): 50 ug via INTRAVENOUS
  Administered 2022-11-17 (×2): 25 ug via INTRAVENOUS
  Administered 2022-11-17: 50 ug via INTRAVENOUS
  Filled 2022-11-15 (×7): qty 1

## 2022-11-15 MED ORDER — ENOXAPARIN SODIUM 30 MG/0.3ML IJ SOSY
30.0000 mg | PREFILLED_SYRINGE | INTRAMUSCULAR | Status: DC
Start: 2022-11-15 — End: 2022-11-19
  Administered 2022-11-15 – 2022-11-18 (×4): 30 mg via SUBCUTANEOUS
  Filled 2022-11-15 (×4): qty 0.3

## 2022-11-15 MED ORDER — SODIUM CHLORIDE 0.9 % IV SOLN
2.0000 g | INTRAVENOUS | Status: DC
  Administered 2022-11-16 – 2022-11-20 (×5): 2 g via INTRAVENOUS
  Filled 2022-11-15 (×5): qty 20

## 2022-11-15 MED ORDER — ACETAMINOPHEN 650 MG RE SUPP
650.0000 mg | Freq: Four times a day (QID) | RECTAL | Status: DC | PRN
  Administered 2022-11-17: 650 mg via RECTAL
  Filled 2022-11-15: qty 1

## 2022-11-15 MED ORDER — ONDANSETRON HCL 4 MG PO TABS: 4.0000 mg | ORAL_TABLET | Freq: Four times a day (QID) | ORAL | Status: DC | PRN

## 2022-11-15 MED ORDER — LACTATED RINGERS IV SOLN: INTRAVENOUS | Status: AC

## 2022-11-15 MED ORDER — FESOTERODINE FUMARATE ER 4 MG PO TB24
4.0000 mg | ORAL_TABLET | Freq: Every day | ORAL | Status: DC
Start: 2022-11-15 — End: 2022-11-15

## 2022-11-15 MED ORDER — ONDANSETRON HCL 4 MG/2ML IJ SOLN
4.0000 mg | Freq: Four times a day (QID) | INTRAMUSCULAR | Status: DC | PRN
  Administered 2022-11-17 – 2022-11-21 (×3): 4 mg via INTRAVENOUS
  Filled 2022-11-15 (×3): qty 2

## 2022-11-15 MED ORDER — OXYCODONE HCL 5 MG PO TABS
5.0000 mg | ORAL_TABLET | ORAL | Status: DC | PRN
  Administered 2022-11-15 – 2022-11-18 (×6): 5 mg via ORAL
  Filled 2022-11-15 (×6): qty 1

## 2022-11-15 MED ORDER — FENTANYL CITRATE PF 50 MCG/ML IJ SOSY
50.0000 ug | PREFILLED_SYRINGE | Freq: Once | INTRAMUSCULAR | Status: AC
  Administered 2022-11-15: 50 ug via INTRAVENOUS
  Filled 2022-11-15: qty 1

## 2022-11-15 MED ORDER — ACETAMINOPHEN 325 MG PO TABS
650.0000 mg | ORAL_TABLET | Freq: Four times a day (QID) | ORAL | Status: DC | PRN
  Administered 2022-11-15 – 2022-11-21 (×6): 650 mg via ORAL
  Filled 2022-11-15 (×7): qty 2

## 2022-11-15 MED ORDER — ENOXAPARIN SODIUM 40 MG/0.4ML IJ SOSY: 40.0000 mg | PREFILLED_SYRINGE | INTRAMUSCULAR | Status: DC

## 2022-11-15 MED ORDER — FESOTERODINE FUMARATE ER 4 MG PO TB24
4.0000 mg | ORAL_TABLET | Freq: Every day | ORAL | Status: DC
  Administered 2022-11-16 – 2022-11-21 (×6): 4 mg via ORAL
  Filled 2022-11-15 (×6): qty 1

## 2022-11-15 MED ORDER — AMLODIPINE BESYLATE 5 MG PO TABS
5.0000 mg | ORAL_TABLET | Freq: Every day | ORAL | Status: DC
  Administered 2022-11-16 – 2022-11-20 (×5): 5 mg via ORAL
  Filled 2022-11-15 (×5): qty 1

## 2022-11-15 MED ORDER — LORAZEPAM 2 MG/ML IJ SOLN
0.5000 mg | INTRAMUSCULAR | Status: DC | PRN
  Administered 2022-11-16 – 2022-11-17 (×4): 0.5 mg via INTRAVENOUS
  Filled 2022-11-15 (×4): qty 1

## 2022-11-15 MED ORDER — TRAZODONE HCL 50 MG PO TABS
50.0000 mg | ORAL_TABLET | Freq: Every evening | ORAL | Status: DC | PRN
  Administered 2022-11-15 – 2022-11-20 (×5): 50 mg via ORAL
  Filled 2022-11-15 (×5): qty 1

## 2022-11-15 NOTE — ED Provider Notes (Signed)
[facility] AT [facility] Provider Note   CSN: [id number] Arrival date & time: 11/15/22  1103     History {Add pertinent medical, surgical, social history, OB history to HPI:1} Chief Complaint  Patient presents with   Back Pain    Laura [name] is a 77 y.o. female.  HPI     77 year old female comes in with chief complaint of back pain. Patient has past medical history of ovarian cancer with peritoneal metastasis and retroperitoneal mass leading to mass effects, hydronephrosis and ureteral stent.  Patient states that over the last several days she has been having back pain.  Back pain is located over her back and right hip area and right lower quadrant of the abdomen.  Pain radiates down towards her knee and it is constant, with worsening of the pain with movement.  Patient denies any associated numbness or tingling.  She has come to the ER on 2 separate occasions and seen her PCP on 2 separate occasions for this pain in the last 7 to 10 days.  Patient is taking medications at home for pain control that is not helping.  She states that earlier this year Dr. Arita Miss, urology told her that she was unsuccessful including new ureteral stent and that patient might need nephrostomy tubes.  She did follow-up with the cancer team thereafter but there was no clarity on the plan.  She is having some urinary discomfort and has noted that she is having less void than normal.  Pt has no associated numbness, weakness, urinary incontinence, urinary retention, bowel incontinence, pins and needle sensation in the perineal area.   Home Medications Prior to Admission medications   Medication Sig Start Date End Date Taking? Authorizing Provider  acetaminophen (TYLENOL) 500 MG tablet Take 1 tablet (500 mg total) by mouth every 6 (six) hours as needed. Patient taking differently: Take 500 mg by mouth every 6 (six) hours as needed for mild pain (pain score 1-3) or moderate  pain (pain score 4-6). 05/16/20  Yes Kerrin Champagne, MD  amLODipine (NORVASC) 10 MG tablet Take 1 tablet (10 mg total) by mouth daily. Patient taking differently: Take 5 mg by mouth daily. 07/29/22  Yes Doreatha Massed, MD  bismuth subsalicylate (PEPTO BISMOL) 262 MG/15ML suspension Take 30 mLs by mouth every 6 (six) hours as needed for indigestion or diarrhea or loose stools.   Yes [provider]  docusate sodium (COLACE) 100 MG capsule Take 2 capsules (200 mg total) by mouth 2 (two) times daily. Patient taking differently: Take 100 mg by mouth as needed for moderate constipation. 07/01/22  Yes Rojelio Brenner M, PA-C  famotidine-calcium carbonate-magnesium hydroxide (PEPCID COMPLETE) 10-800-165 MG chewable tablet Chew 1 tablet by mouth daily.   Yes [provider]  furosemide (LASIX) 20 MG tablet TAKE 1 TABLET (20MG ) BY MOUTH DAILYAS NEEDED FOR EDEMA (SWELLING) Patient taking differently: Take 20 mg by mouth daily. 09/28/22  Yes Pennington, Rebekah M, PA-C  glimepiride (AMARYL) 2 MG tablet TAKE 1/2 TABLET (1MG ) BY MOUTH ONCE DAILY BEFORE BREAKFAST 08/21/22  Yes Corwin Levins, MD  HYDROmorphone (DILAUDID) 4 MG tablet Take 4 mg by mouth every 4 (four) hours as needed for severe pain (PRN as needed.).   Yes [provider]  lidocaine (LIDODERM) 5 % Place 1 patch onto the skin daily. Remove & Discard patch within 12 hours or as directed by MD Patient taking differently: Place 1-3 patches onto the skin daily as needed (pain). Remove &  Discard patch within 12 hours or as directed by MD 09/01/22  Yes Doreatha Massed, MD  loperamide (IMODIUM A-D) 2 MG tablet Take 2-6 mg by mouth as needed for diarrhea or loose stools.   Yes [provider]  loratadine (CLARITIN) 10 MG tablet Take 10 mg by mouth daily.   Yes [provider]  meloxicam (MOBIC) 7.5 MG tablet Take 7.5 mg by mouth daily. 11/10/22  Yes [provider]  prochlorperazine (COMPAZINE) 10  MG tablet Take 1 tablet (10 mg total) by mouth every 6 (six) hours as needed for nausea or vomiting. 07/14/22  Yes Doreatha Massed, MD  solifenacin (VESICARE) 5 MG tablet Take 1 tablet (5 mg total) by mouth daily. 10/13/22  Yes   traMADol (ULTRAM) 50 MG tablet Take 1 every 6 hours for pain not relieved by Tylenol alone Patient taking differently: Take 50 mg by mouth every 6 (six) hours as needed for moderate pain (pain score 4-6) or severe pain (pain score 7-10). 11/12/22  Yes Bethann Berkshire, MD  traZODone (DESYREL) 100 MG tablet 1/2 - 1 tab by mouth at bedtime for sleep as needed Patient taking differently: Take 50 mg by mouth at bedtime as needed for sleep. 08/26/22  Yes Corwin Levins, MD  Blood Glucose Monitoring Suppl (ONE TOUCH ULTRA 2) w/Device KIT Use as directed 04/06/15   Corwin Levins, MD  glucose blood [facility] ULTRA) test strip USE TO CHECK BLOOD SUGARS TWO TIMES DAILY 03/22/21   Corwin Levins, MD  Lancets [facility] ULTRASOFT) lancets 1 each by Other route as needed for other. Use as instructed 03/20/21   Corwin Levins, MD  megestrol (MEGACE) 40 MG/ML suspension Take by mouth daily. Patient not taking: Reported on 11/15/2022    [provider]      Allergies    Ciprofloxacin, Hydrocodone bit-homatrop mbr, Prednisone, Sulfa antibiotics, Alfuzosin, Codeine, Crestor [rosuvastatin calcium], Doxycycline, Gabapentin, Keflex [cephalexin], Myrbetriq [mirabegron er], Naproxen, and Statins    Review of Systems   Review of Systems  All other systems reviewed and are negative.   Physical Exam Updated Vital Signs BP (!) 157/88   Pulse 93   Temp 98.3 F (36.8 C) (Oral)   Resp 15   Ht 5\' 3"  (1.6 m)   Wt 82.1 kg   SpO2 100%   BMI 32.06 kg/m  Physical Exam Vitals and nursing note reviewed.  Constitutional:      Appearance: She is well-developed.  HENT:     Head: Atraumatic.  Eyes:     Extraocular Movements: Extraocular movements intact.     Pupils: Pupils are equal, round,  and reactive to light.  Cardiovascular:     Rate and Rhythm: Normal rate.  Pulmonary:     Effort: Pulmonary effort is normal.  Musculoskeletal:        General: Tenderness present.     Cervical back: Normal range of motion and neck supple.     Comments: Patient has tenderness over the lumbar spine, right hip and also right lower abdomen and right flank region  Skin:    General: Skin is warm and dry.  Neurological:     Mental Status: She is alert and oriented to person, place, and time.     ED Results / Procedures / Treatments   Labs (all labs ordered are listed, but only abnormal results are displayed) Labs Reviewed  BASIC METABOLIC PANEL - Abnormal; Notable for the following components:      Result Value   Sodium  127 (*)    CO2 20 (*)    Glucose, Bld 165 (*)    BUN 38 (*)    Creatinine, Ser 1.91 (*)    GFR, Estimated 27 (*)    All other components within normal limits  CBC WITH DIFFERENTIAL/PLATELET - Abnormal; Notable for the following components:   WBC 12.3 (*)    RBC 3.32 (*)    Hemoglobin 10.3 (*)    HCT 30.4 (*)    RDW 15.9 (*)    Neutro Abs 10.2 (*)    Monocytes Absolute 1.1 (*)    Abs Immature Granulocytes 0.08 (*)    All other components within normal limits  URINALYSIS, ROUTINE W REFLEX MICROSCOPIC - Abnormal; Notable for the following components:   APPearance CLOUDY (*)    Hgb urine dipstick MODERATE (*)    Leukocytes,Ua LARGE (*)    Bacteria, UA RARE (*)    All other components within normal limits  URINE CULTURE    EKG None  Radiology CT ABDOMEN WO CONTRAST  Result Date: 11/15/2022 CLINICAL DATA:  Flank pain. History of ovarian cancer and chemotherapy. EXAM: CT ABDOMEN WITHOUT CONTRAST TECHNIQUE: Multidetector CT imaging of the abdomen was performed following the standard protocol without IV contrast. RADIATION DOSE REDUCTION: This exam was performed according to the departmental dose-optimization program which includes automated exposure control,  adjustment of the mA and/or kV according to patient size and/or use of iterative reconstruction technique. COMPARISON:  CT abdomen pelvis dated 08/25/2022. FINDINGS: Evaluation of this exam is limited in the absence of intravenous contrast. Lower chest: There are bibasilar linear atelectasis/scarring. Trace bilateral pleural effusions suspected. There is 3 vessel coronary vascular calcification. No intra-abdominal free air. Hepatobiliary: The liver is unremarkable. No biliary ductal dilatation. Cholecystectomy. No retained calcified stone noted in the central CBD. Pancreas: Unremarkable. No pancreatic ductal dilatation or surrounding inflammatory changes. Spleen: Normal in size without focal abnormality. Adrenals/Urinary Tract: The adrenal glands are unremarkable. There is mild left hydronephrosis similar to prior CT. No stone identified in the left kidney or the visualized left ureter. There is severe right hydronephrosis, significantly progressed since the prior CT. Interval removal of the previously seen right ureteral stent. No stone identified. Stomach/Bowel: There is a small hiatal hernia. Dense stool noted throughout the colon. No bowel dilatation in the visualized abdomen. Vascular/Lymphatic: Moderate aortoiliac atherosclerotic disease. Retroperitoneal adenopathy encasing the distal abdominal aorta. There is a 5.6 x 4.7 cm mass inferior to the right renal hilum contiguous with the right paraspinal/retroperitoneal mass. Overall similar or slightly increased since the prior CT. Other: None Musculoskeletal: Osteopenia with degenerative changes. No acute osseous pathology. IMPRESSION: 1. Interval removal of the previously seen right ureteral stent with development of severe right hydronephrosis. 2. Retroperitoneal adenopathy similar or slightly progressed since the prior CT. 3. Mild left hydronephrosis similar to prior CT. 4. Aortic Atherosclerosis (ICD10-I70.0). Electronically Signed   By: Elgie Collard M.D.    On: 11/15/2022 15:36    Procedures Procedures  {Document cardiac monitor, telemetry assessment procedure when appropriate:1}  Medications Ordered in ED Medications  cefTRIAXone (ROCEPHIN) 1 g in sodium chloride 0.9 % 100 mL IVPB (has no administration in time range)  fentaNYL (SUBLIMAZE) injection 50 mcg (50 mcg Intravenous Given 11/15/22 1443)    ED Course/ Medical Decision Making/ A&P   {   Click here for ABCD2, HEART and other calculatorsREFRESH Note before signing :1}  Medical Decision Making Amount and/or Complexity of Data Reviewed Labs: ordered. Radiology: ordered.  Risk Prescription drug management. Decision regarding hospitalization.   77 year old female primarily comes in with chief complaint of back pain.  She is also reporting decreased urine output and indicates that recently the urologist was unsuccessful in putting a ureteral stent.  She does not know what the follow-up plan is about to fail stent.  Patient has known history of ovarian cancer with metastases. She has been seen in the ER for 2 separate occasions for back pain, and also by her PCP.  Differential diagnosis considered for this patient includes cancer related pain/metastatic disease to her spine, worsening cancer/tumor mass, hydronephrosis, bladder spasms/spasmodic pain/colic pain, pyelonephritis.  Clinically, it appears that the pain is musculoskeletal in nature as the pain is worse with ambulation.  It does not appear that patient has any red flags for cauda equina/cord compression.  I discussed the case with Dr. Leonides Schanz, oncology.  It appears that patient is having severe pain leading to multiple ED visits.  I reviewed patient's records and it appears that she saw Dr. Ellin Saba recently.  Patient informs me that there is some conversation about transitioning her care to palliation.  Dr. Leonides Schanz thinks it is a good idea to get a CT scan and admit patient for pain  control.  I have consulted urology and spoken with Dr. Jennette Dubin.  He will follow-up on the patient as well.  CT noncontrast ordered at this time.  Basic labs have been ordered.  UA is back, it shows signs of infection.  Will give antibiotics.  Will give patient something for pain.  Plan is to admit the patient for pain control and for palliative, oncology and urology to see her.  Patient might need nephrostomy tubes.  Final Clinical Impression(s) / ED Diagnoses Final diagnoses:  None    Rx / DC Orders ED Discharge Orders     None

## 2022-11-15 NOTE — ED Notes (Signed)
Med administration and blood draw delayed. Patient requests her port be used, but area must be numb first.

## 2022-11-15 NOTE — ED Notes (Signed)
Fall risk band applied to right wrist.

## 2022-11-15 NOTE — H&P (Signed)
History and Physical    Laura [name] ZOX:[id number] DOB: 1945-04-11 DOA: 11/15/2022  PCP: Corwin Levins, MD   Chief Complaint:  pain  HPI: Laura [name] is a 77 y.o. female with medical history significant of ovarian cancer, type 2 diabetes, GERD, IBS who presents emergency department due to intractable pain pain.  Patient was seen by primary care on 10/31.  She was endorsing refractory pain to her home Dilaudid and was given a Toradol injection.  Patient presented to the ER due to refractory symptoms.  Patient states the last 3 days she has been having refractory back pain located over her right flank and right hip with radiation down her legs.  She has had numerous prior presentation to the ER for similar complaints and is taking chronic pain medications at home.  Regarding her cancer she has widespread disease with peritoneal metastasis.  She last saw her oncologist on 10/17.  She is undergoing chemotherapy and radiation does been complicated by right hydronephrosis requiring stent placement.  In the ER lab were obtained which showed sodium 127, creatinine 1.9 baseline 1.3, WBC 12.3, hemoglobin 10.3, platelets 206, urinalysis concerning for infection.  Patient underwent CT abdomen which showed removal of previous placed ureteral stent with severe right hydronephrosis.  Urology was consulted in the emergency department and patient was admitted for further workup.  On admission she was given ceftriaxone for UTI.    Review of Systems: Review of Systems  Constitutional:  Negative for chills and fever.  HENT: Negative.    Eyes: Negative.   Respiratory: Negative.    Cardiovascular: Negative.   Gastrointestinal: Negative.   Genitourinary: Negative.   Musculoskeletal: Negative.   Skin: Negative.   Neurological: Negative.   Endo/Heme/Allergies: Negative.   Psychiatric/Behavioral: Negative.    All other systems reviewed and are negative.    As per HPI otherwise 10 point review of systems  negative.   Allergies  Allergen Reactions   Ciprofloxacin Swelling    Torn tendon   Hydrocodone Bit-Homatrop Mbr Other (See Comments)    Vertigo *pt strongly prefers to never take* took 3 years to recover from   Prednisone Anxiety    *pt strongly prefers to never be given prednisone*    Sulfa Antibiotics Hives, Itching and Swelling    Tongue swells   Alfuzosin Other (See Comments)    Weakness and fatigue   Codeine Itching   Crestor [Rosuvastatin Calcium] Other (See Comments)    Did something to memory     Doxycycline Other (See Comments)    Severe rectal Gas.   Gabapentin Other (See Comments)    disoriented   Keflex [Cephalexin] Diarrhea and Nausea And Vomiting   Myrbetriq [facility]] Swelling    Swelling of legs and feet   Naproxen Other (See Comments)    Stomach cramps/loud gas   Statins Other (See Comments)    Muscle weakness    Past Medical History:  Diagnosis Date   Anemia    Anxiety    Arthritis    back of neck, bones spurs on neck   Cancer (HCC)    ovarian cancer   Cataract both eyes    Cervical disc disease    Diabetes mellitus Type 2    Dyspnea    with exertion   Family history of adverse reaction to anesthesia    sister slow to awaken   Family history of thyroid cancer    GERD (gastroesophageal reflux disease)    Heart murmur  History of COVID-19 06/2020   pain in side took paxlovid x 5 days all symptoms resolved   History of radiation therapy    abdominal lymph nodes 01/21/2021-02/01/2021  Dr Antony Blackbird   Hydronephrosis 08/24/2020   Right Kidney (stable per CT)   Hyperlipidemia    Hypertension    IBS (irritable bowel syndrome)    Left shoulder frozen with limited rom    Mucoid cyst of joint    right thumb   Neuropathy 03/12/2021   hands/fingers both hands numb and tingle @ times   Peripheral vascular disease (HCC)    Port-A-Cath in place 07/21/2019   Reflux    Sleep apnea 03/12/2021   has not used cpap in 3 years   Vertigo 12/2010    none since treated at [facility]   Wears glasses for reading    Wears partial dentures upper     Past Surgical History:  Procedure Laterality Date   BLADDER SURGERY     x 3 at [facility]   BREAST EXCISIONAL BIOPSY Bilateral    BREAST SURGERY     fibroid cyst removed ? over 10 yrs ago at [facility] per pt on 03-12-2021   CHOLECYSTECTOMY     yrs ago   COLONOSCOPY  07/02/2020   and 09-19-2016   CYSTOSCOPY W/ URETERAL STENT PLACEMENT Right 03/13/2021   Procedure: CYSTOSCOPY WITH RETROGRADE PYELOGRAM/URETERAL STENT PLACEMENT;  Surgeon: Noel Christmas, MD;  Location: [facility];  Service: Urology;  Laterality: Right;  30 MINS   CYSTOSCOPY W/ URETERAL STENT PLACEMENT Right 10/01/2021   Procedure: CYSTOSCOPY WITH RETROGRADE PYELOGRAM/URETERAL STENT REPLACEMENT;  Surgeon: Noel Christmas, MD;  Location: [facility] [place];  Service: Urology;  Laterality: Right;   CYSTOSCOPY W/ URETERAL STENT PLACEMENT Bilateral 03/18/2022   Procedure: CYSTOSCOPY WITH BILATERAL RETROGRADE PYELOGRAM/RIGHT URETERAL STENT EXCHANGE;  Surgeon: Noel Christmas, MD;  Location: [facility];  Service: Urology;  Laterality: Bilateral;  1HR   CYSTOSCOPY WITH RETROGRADE PYELOGRAM, URETEROSCOPY AND STENT PLACEMENT Right 05/06/2022   Procedure: CYSTOSCOPY WITH RIGHT RETROGRADE PYELOGRAM, AND RIGHT URETERAL STENT EXCHANGE;  Surgeon: Noel Christmas, MD;  Location: [facility];  Service: Urology;  Laterality: Right;   CYSTOSCOPY WITH RETROGRADE PYELOGRAM, URETEROSCOPY AND STENT PLACEMENT Right 10/21/2022   Procedure: CYSTOSCOPY, RIGHT  RETROGRADE PYELOGRAM AND RIGHT STENT REMOVAL;  Surgeon: Noel Christmas, MD;  Location: [facility];  Service: Urology;  Laterality: Right;   fibroids removed     breast (both breasts) age 77 and total of 3 surgeries   history of chemotherapy     6 rounds june 2021   IR IMAGING GUIDED PORT INSERTION  07/26/2019   right   MASS EXCISION Right 06/26/2016   Procedure: EXCISION MUCOID TUMOR RIGHT  THUMB, IP RIGHT THUMB;  Surgeon: Cindee Salt, MD;  Location: [facility];  Service: Orthopedics;  Laterality: Right;   ROBOTIC ASSISTED BILATERAL SALPINGO OOPHERECTOMY N/A 01/03/2020   Procedure: XI ROBOTIC ASSISTED BILATERAL SALPINGO OOPHORECTOMY, RADICAL TUMOR DEBULKING;  Surgeon: Adolphus Birchwood, MD;  Location: [facility];  Service: Gynecology;  Laterality: N/A;   ROBOTIC PELVIC AND PARA-AORTIC LYMPH NODE DISSECTION N/A 01/03/2020   Procedure: XI ROBOTIC PARA-AORTIC LYMPHADENECTOMY;  Surgeon: Adolphus Birchwood, MD;  Location: [facility];  Service: Gynecology;  Laterality: N/A;   VAGINAL HYSTERECTOMY     age late 51's     reports that she has never smoked. She has never used smokeless tobacco. She reports that she does not drink alcohol and does not  use drugs.  Family History  Problem Relation Age of Onset   Hypertension Mother    Diabetes Father    Hypertension Father    Diabetes Sister    Breast cancer Sister        stage 0   Hypertension Sister    Stroke Sister    Diabetes Maternal Aunt    Thyroid cancer Daughter 70   Hypertension Other    Colon cancer Neg Hx    Esophageal cancer Neg Hx    Stomach cancer Neg Hx    Rectal cancer Neg Hx    Endometrial cancer Neg Hx    Ovarian cancer Neg Hx     Prior to Admission medications   Medication Sig Start Date End Date Taking? Authorizing Provider  acetaminophen (TYLENOL) 500 MG tablet Take 1 tablet (500 mg total) by mouth every 6 (six) hours as needed. Patient taking differently: Take 500 mg by mouth every 6 (six) hours as needed for mild pain (pain score 1-3) or moderate pain (pain score 4-6). 05/16/20  Yes Kerrin Champagne, MD  amLODipine (NORVASC) 10 MG tablet Take 1 tablet (10 mg total) by mouth daily. Patient taking differently: Take 5 mg by mouth daily. 07/29/22  Yes Doreatha Massed, MD  bismuth subsalicylate (PEPTO BISMOL) 262 MG/15ML suspension Take 30 mLs by mouth every 6 (six) hours as needed for indigestion or diarrhea or  loose stools.   Yes [provider]  docusate sodium (COLACE) 100 MG capsule Take 2 capsules (200 mg total) by mouth 2 (two) times daily. Patient taking differently: Take 100 mg by mouth as needed for moderate constipation. 07/01/22  Yes Rojelio Brenner M, PA-C  famotidine-calcium carbonate-magnesium hydroxide (PEPCID COMPLETE) 10-800-165 MG chewable tablet Chew 1 tablet by mouth daily.   Yes [provider]  furosemide (LASIX) 20 MG tablet TAKE 1 TABLET (20MG ) BY MOUTH DAILYAS NEEDED FOR EDEMA (SWELLING) Patient taking differently: Take 20 mg by mouth daily. 09/28/22  Yes Pennington, Rebekah M, PA-C  glimepiride (AMARYL) 2 MG tablet TAKE 1/2 TABLET (1MG ) BY MOUTH ONCE DAILY BEFORE BREAKFAST 08/21/22  Yes Corwin Levins, MD  HYDROmorphone (DILAUDID) 4 MG tablet Take 4 mg by mouth every 4 (four) hours as needed for severe pain (PRN as needed.).   Yes [provider]  lidocaine (LIDODERM) 5 % Place 1 patch onto the skin daily. Remove & Discard patch within 12 hours or as directed by MD Patient taking differently: Place 1-3 patches onto the skin daily as needed (pain). Remove & Discard patch within 12 hours or as directed by MD 09/01/22  Yes Doreatha Massed, MD  loperamide (IMODIUM A-D) 2 MG tablet Take 2-6 mg by mouth as needed for diarrhea or loose stools.   Yes [provider]  loratadine (CLARITIN) 10 MG tablet Take 10 mg by mouth daily.   Yes [provider]  meloxicam (MOBIC) 7.5 MG tablet Take 7.5 mg by mouth daily. 11/10/22  Yes [provider]  prochlorperazine (COMPAZINE) 10 MG tablet Take 1 tablet (10 mg total) by mouth every 6 (six) hours as needed for nausea or vomiting. 07/14/22  Yes Doreatha Massed, MD  solifenacin (VESICARE) 5 MG tablet Take 1 tablet (5 mg total) by mouth daily. 10/13/22  Yes   traMADol (ULTRAM) 50 MG tablet Take 1 every 6 hours for pain not relieved by Tylenol alone Patient taking differently: Take 50 mg by  mouth every 6 (six) hours as needed for moderate pain (pain score 4-6) or severe  pain (pain score 7-10). 11/12/22  Yes Bethann Berkshire, MD  traZODone (DESYREL) 100 MG tablet 1/2 - 1 tab by mouth at bedtime for sleep as needed Patient taking differently: Take 50 mg by mouth at bedtime as needed for sleep. 08/26/22  Yes Corwin Levins, MD  Blood Glucose Monitoring Suppl (ONE TOUCH ULTRA 2) w/Device KIT Use as directed 04/06/15   Corwin Levins, MD  glucose blood [facility] ULTRA) test strip USE TO CHECK BLOOD SUGARS TWO TIMES DAILY 03/22/21   Corwin Levins, MD  Lancets [facility] ULTRASOFT) lancets 1 each by Other route as needed for other. Use as instructed 03/20/21   Corwin Levins, MD  megestrol (MEGACE) 40 MG/ML suspension Take by mouth daily. Patient not taking: Reported on 11/15/2022    [provider]    Physical Exam: Vitals:   11/15/22 1500 11/15/22 1549 11/15/22 1601 11/15/22 1615  BP: (!) 157/88  110/85 (!) 150/99  Pulse: 93   98  Resp:      Temp:  98.3 F (36.8 C)    TempSrc:  Oral    SpO2: 100%   100%  Weight:      Height:       Physical Exam Vitals reviewed.  Constitutional:      Appearance: She is normal weight.  HENT:     Head: Normocephalic.     Mouth/Throat:     Mouth: Mucous membranes are moist.     Pharynx: No oropharyngeal exudate.  Eyes:     Conjunctiva/sclera: Conjunctivae normal.     Pupils: Pupils are equal, round, and reactive to light.  Cardiovascular:     Rate and Rhythm: Normal rate and regular rhythm.     Pulses: Normal pulses.     Heart sounds: Normal heart sounds.  Pulmonary:     Effort: Pulmonary effort is normal.     Breath sounds: Normal breath sounds.  Abdominal:     General: Abdomen is flat. Bowel sounds are normal.     Palpations: Abdomen is soft.  Musculoskeletal:        General: Normal range of motion.     Cervical back: Normal range of motion.  Skin:    General: Skin is warm.     Capillary Refill: Capillary refill takes less than 2  seconds.  Neurological:     General: No focal deficit present.     Mental Status: She is alert. Mental status is at baseline.  Psychiatric:        Mood and Affect: Mood normal.    Labs on Admission: I have personally reviewed the patients's labs and imaging studies.  Assessment/Plan Principal Problem:   Cancer-related breakthrough pain   # Cancer related pain secondary to widely metastatic ovarian serous carcinoma # Right sided flank pain with radiation down leg concerning for worsening ureteral involvement\ #Obstructive uropathy with resultant AKI - Prior urologic stent currently not in place - No systemic infectious symptoms - Urinalysis concerning for infection  Plan: Continue ceftriaxone IV fluids Appreciate urology consultation As needed fentanyl, oxycodone and Tylenol  #GERD-continue Pepcid  # Type 2 diabetes-hold oral antiglycemic's  # Obstructive sleep apnea-patient's not on but CPAP  # AKI on CKD stage IIIb-trend creatinine and nephrology consulted  #Insomnia- continue trazodone  Admission status: Inpatient Telemetry  Certification: The appropriate patient status for this patient is INPATIENT. Inpatient status is judged to be reasonable and necessary in order to provide the required intensity of service to ensure the patient's safety. The patient's  presenting symptoms, physical exam findings, and initial radiographic and laboratory data in the context of their chronic comorbidities is felt to place them at high risk for further clinical deterioration. Furthermore, it is not anticipated that the patient will be medically stable for discharge from the hospital within 2 midnights of admission.   * I certify that at the point of admission it is my clinical judgment that the patient will require inpatient hospital care spanning beyond 2 midnights from the point of admission due to high intensity of service, high risk for further deterioration and high frequency of  surveillance required.Alan Mulder MD [facility] If 7PM-7AM, please contact night-coverage [email or web address]  11/15/2022, 4:43 PM

## 2022-11-15 NOTE — ED Triage Notes (Addendum)
Patient reports excruciating pain in lower back, shooting down both legs X 3 weeks Has been to ED 3x and to pcp Pain rated 10/10  Patient says she previously had catheter, pcp says she may need another Reports difficulty urinating

## 2022-11-16 ENCOUNTER — Encounter (HOSPITAL_COMMUNITY): Payer: Self-pay | Admitting: Internal Medicine

## 2022-11-16 DIAGNOSIS — Z515 Encounter for palliative care: Secondary | ICD-10-CM

## 2022-11-16 DIAGNOSIS — C799 Secondary malignant neoplasm of unspecified site: Secondary | ICD-10-CM

## 2022-11-16 DIAGNOSIS — G893 Neoplasm related pain (acute) (chronic): Secondary | ICD-10-CM | POA: Diagnosis not present

## 2022-11-16 DIAGNOSIS — Z7189 Other specified counseling: Secondary | ICD-10-CM | POA: Diagnosis not present

## 2022-11-16 DIAGNOSIS — N1339 Other hydronephrosis: Secondary | ICD-10-CM | POA: Diagnosis not present

## 2022-11-16 LAB — CBC
HCT: 27.4 % — ABNORMAL LOW (ref 36.0–46.0)
Hemoglobin: 9 g/dL — ABNORMAL LOW (ref 12.0–15.0)
MCH: 30.7 pg (ref 26.0–34.0)
MCHC: 32.8 g/dL (ref 30.0–36.0)
MCV: 93.5 fL (ref 80.0–100.0)
Platelets: 180 10*3/uL (ref 150–400)
RBC: 2.93 MIL/uL — ABNORMAL LOW (ref 3.87–5.11)
RDW: 16.1 % — ABNORMAL HIGH (ref 11.5–15.5)
WBC: 10 10*3/uL (ref 4.0–10.5)
nRBC: 0 % (ref 0.0–0.2)

## 2022-11-16 LAB — BASIC METABOLIC PANEL
Anion gap: 9 (ref 5–15)
BUN: 29 mg/dL — ABNORMAL HIGH (ref 8–23)
CO2: 21 mmol/L — ABNORMAL LOW (ref 22–32)
Calcium: 8.7 mg/dL — ABNORMAL LOW (ref 8.9–10.3)
Chloride: 101 mmol/L (ref 98–111)
Creatinine, Ser: 1.84 mg/dL — ABNORMAL HIGH (ref 0.44–1.00)
GFR, Estimated: 28 mL/min — ABNORMAL LOW (ref >60)
Glucose, Bld: 87 mg/dL (ref 70–99)
Potassium: 3.5 mmol/L (ref 3.5–5.1)
Sodium: 131 mmol/L — ABNORMAL LOW (ref 135–145)

## 2022-11-16 LAB — PROTIME-INR
INR: 1.2 (ref 0.8–1.2)
Prothrombin Time: 15.2 s (ref 11.4–15.2)

## 2022-11-16 MED ORDER — ACETAMINOPHEN 500 MG PO TABS: 500.0000 mg | ORAL_TABLET | Freq: Four times a day (QID) | ORAL | Status: DC | PRN

## 2022-11-16 MED ORDER — SODIUM CHLORIDE 0.9 % IV SOLN: INTRAVENOUS | Status: DC

## 2022-11-16 MED ORDER — DOCUSATE SODIUM 100 MG PO CAPS
200.0000 mg | ORAL_CAPSULE | Freq: Two times a day (BID) | ORAL | Status: DC
  Administered 2022-11-16 – 2022-11-21 (×10): 200 mg via ORAL
  Filled 2022-11-16 (×9): qty 2

## 2022-11-16 MED ORDER — HYDROMORPHONE HCL 4 MG PO TABS
4.0000 mg | ORAL_TABLET | ORAL | Status: DC | PRN
  Administered 2022-11-16 – 2022-11-17 (×4): 4 mg via ORAL
  Filled 2022-11-16 (×5): qty 1

## 2022-11-16 MED ORDER — CHLORHEXIDINE GLUCONATE CLOTH 2 % EX PADS
6.0000 | MEDICATED_PAD | Freq: Every day | CUTANEOUS | Status: DC
  Administered 2022-11-16 – 2022-11-20 (×5): 6 via TOPICAL

## 2022-11-16 NOTE — Progress Notes (Addendum)
Left Leg significantly swollen compared to right leg.  Compression stocking placed on left leg up to thigh.  Notified MD.  Pt c/o of right leg pain, however just tightness in the left leg.  Will continue to monitor.

## 2022-11-16 NOTE — Progress Notes (Signed)
PROGRESS NOTE    Laura [name]  ZOX:[id number] DOB: 26-Oct-1945 DOA: 11/15/2022 PCP: Corwin Levins, MD   Brief Narrative:  HPI: Laura [name] is a 77 y.o. female with medical history significant of ovarian cancer, type 2 diabetes, GERD, IBS who presents emergency department due to intractable pain pain.  Patient was seen by primary care on 10/31.  She was endorsing refractory pain to her home Dilaudid and was given a Toradol injection.  Patient presented to the ER due to refractory symptoms.  Patient states the last 3 days she has been having refractory back pain located over her right flank and right hip with radiation down her legs.  She has had numerous prior presentation to the ER for similar complaints and is taking chronic pain medications at home.  Regarding her cancer she has widespread disease with peritoneal metastasis.  She last saw her oncologist on 10/17.  She is undergoing chemotherapy and radiation does been complicated by right hydronephrosis requiring stent placement.   In the ER lab were obtained which showed sodium 127, creatinine 1.9 baseline 1.3, WBC 12.3, hemoglobin 10.3, platelets 206, urinalysis concerning for infection.  Patient underwent CT abdomen which showed removal of previous placed ureteral stent with severe right hydronephrosis.  Urology was consulted in the emergency department and patient was admitted for further workup.  On admission she was given ceftriaxone for UTI.     Assessment & Plan:   Principal Problem:   Cancer-related breakthrough pain  AKI on CKD stage IIIa/right hydronephrosis/UTI: UA consistent with UTI.  Patient has history of right hydronephrosis, previously had ureteral stent, urology tried to replace but this was unsuccessful, she opted to give herself trial of without stent, comes in with right-sided flank pain, secondary to right hydronephrosis.  Has been started on Rocephin, follow culture.  Has been seen by urology, they are going to contact  IR for right nephrostomy tube.  Appreciate their help.  Creatinine only slightly improved, will resume her on gentle IV hydration.  History of metastatic ovarian carcinoma with cancer related pain: Pain not controlled with current regimen, will resume home Dilaudid.  GERD: Continue Pepcid.  Hypertension: Blood pressure fairly controlled.  Continue home amlodipine.  Prediabetes: Hemoglobin A1c 5.9 just 3 weeks ago, she is on diet control.  Blood sugar very well-controlled.  Does not need SSI.  Obstructive sleep apnea: Patient does not use CPAP.  Insomnia: Continue trazodone.  DVT prophylaxis: enoxaparin (LOVENOX) injection 30 mg Start: 11/15/22 2000 SCDs Start: 11/15/22 1636   Code Status: Full Code  Family Communication:  None present at bedside.  Plan of care discussed with patient in length and he/she verbalized understanding and agreed with it.  Status is: Inpatient Remains inpatient appropriate because: Needs nephrostomy tube.   Estimated body mass index is 32.06 kg/m as calculated from the following:   Height as of this encounter: 5\' 3"  (1.6 m).   Weight as of this encounter: 82.1 kg.    Nutritional Assessment: Body mass index is 32.06 kg/m.Marland Kitchen Seen by dietician.  I agree with the assessment and plan as outlined below: Nutrition Status:        . Skin Assessment: I have examined the patient's skin and I agree with the wound assessment as performed by the wound care RN as outlined below:    Consultants:  Urology  Procedures:  None  Antimicrobials:  Anti-infectives (From admission, onward)    Start     Dose/Rate Route Frequency Ordered Stop   11/16/22  1000  cefTRIAXone (ROCEPHIN) 2 g in sodium chloride 0.9 % 100 mL IVPB        2 g 200 mL/hr over 30 Minutes Intravenous Every 24 hours 11/15/22 1658     11/15/22 1515  cefTRIAXone (ROCEPHIN) 1 g in sodium chloride 0.9 % 100 mL IVPB        1 g 200 mL/hr over 30 Minutes Intravenous  Once 11/15/22 1506 11/15/22 1638          Subjective: Seen and examined, complains of severe pain at right flank but improved compared to yesterday.  Appears comfortable.  No other complaint.  Objective: Vitals:   11/15/22 1753 11/15/22 2135 11/16/22 0110 11/16/22 0532  BP: (!) 155/75 (!) 165/69 (!) 154/88 (!) 145/64  Pulse: 97 (!) 108 92 83  Resp: 19 18 18    Temp: (!) 97.5 F (36.4 C) 99.7 F (37.6 C) 98.5 F (36.9 C) 98.2 F (36.8 C)  TempSrc: Oral Oral Oral Oral  SpO2: 100% 99% 98% 97%  Weight: 82.1 kg     Height:        Intake/Output Summary (Last 24 hours) at 11/16/2022 1126 Last data filed at 11/16/2022 0400 Gross per 24 hour  Intake 950.87 ml  Output 250 ml  Net 700.87 ml   Filed Weights   11/15/22 1111 11/15/22 1753  Weight: 82.1 kg 82.1 kg    Examination:  General exam: Appears calm and comfortable  Respiratory system: Clear to auscultation. Respiratory effort normal. Cardiovascular system: S1 & S2 heard, RRR. No JVD, murmurs, rubs, gallops or clicks. No pedal edema. Gastrointestinal system: Abdomen is nondistended, soft and nontender. No organomegaly or masses felt. Normal bowel sounds heard.  Right CVA tenderness Central nervous system: Alert and oriented. No focal neurological deficits. Extremities: Symmetric 5 x 5 power. Skin: No rashes, lesions or ulcers Psychiatry: Judgement and insight appear normal. Mood & affect appropriate.    Data Reviewed: I have personally reviewed following labs and imaging studies  CBC: Recent Labs  Lab 11/15/22 1234 11/16/22 0426  WBC 12.3* 10.0  NEUTROABS 10.2*  --   HGB 10.3* 9.0*  HCT 30.4* 27.4*  MCV 91.6 93.5  PLT 206 180   Basic Metabolic Panel: Recent Labs  Lab 11/15/22 1234 11/16/22 0426  NA 127* 131*  K 3.8 3.5  CL 98 101  CO2 20* 21*  GLUCOSE 165* 87  BUN 38* 29*  CREATININE 1.91* 1.84*  CALCIUM 9.1 8.7*   GFR: Estimated Creatinine Clearance: 26 mL/min (A) (by C-G formula based on SCr of 1.84 mg/dL (H)). Liver Function  Tests: No results for input(s): "AST", "ALT", "ALKPHOS", "BILITOT", "PROT", "ALBUMIN" in the last 168 hours. No results for input(s): "LIPASE", "AMYLASE" in the last 168 hours. No results for input(s): "AMMONIA" in the last 168 hours. Coagulation Profile: No results for input(s): "INR", "PROTIME" in the last 168 hours. Cardiac Enzymes: No results for input(s): "CKTOTAL", "CKMB", "CKMBINDEX", "TROPONINI" in the last 168 hours. BNP (last 3 results) No results for input(s): "PROBNP" in the last 8760 hours. HbA1C: No results for input(s): "HGBA1C" in the last 72 hours. CBG: No results for input(s): "GLUCAP" in the last 168 hours. Lipid Profile: No results for input(s): "CHOL", "HDL", "LDLCALC", "TRIG", "CHOLHDL", "LDLDIRECT" in the last 72 hours. Thyroid Function Tests: No results for input(s): "TSH", "T4TOTAL", "FREET4", "T3FREE", "THYROIDAB" in the last 72 hours. Anemia Panel: No results for input(s): "VITAMINB12", "FOLATE", "FERRITIN", "TIBC", "IRON", "RETICCTPCT" in the last 72 hours. Sepsis Labs: No results for input(s): "  PROCALCITON", "LATICACIDVEN" in the last 168 hours.  No results found for this or any previous visit (from the past 240 hour(s)).   Radiology Studies: CT ABDOMEN WO CONTRAST  Result Date: 11/15/2022 CLINICAL DATA:  Flank pain. History of ovarian cancer and chemotherapy. EXAM: CT ABDOMEN WITHOUT CONTRAST TECHNIQUE: Multidetector CT imaging of the abdomen was performed following the standard protocol without IV contrast. RADIATION DOSE REDUCTION: This exam was performed according to the departmental dose-optimization program which includes automated exposure control, adjustment of the mA and/or kV according to patient size and/or use of iterative reconstruction technique. COMPARISON:  CT abdomen pelvis dated 08/25/2022. FINDINGS: Evaluation of this exam is limited in the absence of intravenous contrast. Lower chest: There are bibasilar linear atelectasis/scarring. Trace  bilateral pleural effusions suspected. There is 3 vessel coronary vascular calcification. No intra-abdominal free air. Hepatobiliary: The liver is unremarkable. No biliary ductal dilatation. Cholecystectomy. No retained calcified stone noted in the central CBD. Pancreas: Unremarkable. No pancreatic ductal dilatation or surrounding inflammatory changes. Spleen: Normal in size without focal abnormality. Adrenals/Urinary Tract: The adrenal glands are unremarkable. There is mild left hydronephrosis similar to prior CT. No stone identified in the left kidney or the visualized left ureter. There is severe right hydronephrosis, significantly progressed since the prior CT. Interval removal of the previously seen right ureteral stent. No stone identified. Stomach/Bowel: There is a small hiatal hernia. Dense stool noted throughout the colon. No bowel dilatation in the visualized abdomen. Vascular/Lymphatic: Moderate aortoiliac atherosclerotic disease. Retroperitoneal adenopathy encasing the distal abdominal aorta. There is a 5.6 x 4.7 cm mass inferior to the right renal hilum contiguous with the right paraspinal/retroperitoneal mass. Overall similar or slightly increased since the prior CT. Other: None Musculoskeletal: Osteopenia with degenerative changes. No acute osseous pathology. IMPRESSION: 1. Interval removal of the previously seen right ureteral stent with development of severe right hydronephrosis. 2. Retroperitoneal adenopathy similar or slightly progressed since the prior CT. 3. Mild left hydronephrosis similar to prior CT. 4. Aortic Atherosclerosis (ICD10-I70.0). Electronically Signed   By: Elgie Collard M.D.   On: 11/15/2022 15:36    Scheduled Meds:  amLODipine  5 mg Oral Daily   Chlorhexidine Gluconate Cloth  6 each Topical Daily   docusate sodium  200 mg Oral BID   enoxaparin (LOVENOX) injection  30 mg Subcutaneous Q24H   fesoterodine  4 mg Oral Daily   Continuous Infusions:  cefTRIAXone (ROCEPHIN)   IV 2 g (11/16/22 0818)     LOS: 1 day   Hughie Closs, MD [facility]  11/16/2022, 11:26 AM   *Please note that this is a verbal dictation therefore any spelling or grammatical errors are due to the "Dragon [employer] One" system interpretation.  Please page via Amion and do not message via secure chat for urgent patient care matters. Secure chat can be used for non urgent patient care matters.  How to contact the [facility] Attending or Consulting provider 7A - 7P or covering provider during after hours 7P -7A, for this patient?  Check the care team in [facility] and look for a) attending/consulting [facility] provider listed and b) the [facility] team listed. Page or secure chat 7A-7P. Log into [email or web address] and use [place]'s universal password to access. If you do not have the password, please contact the hospital operator. Locate the [facility] provider you are looking for under [facility] and page to a number that you can be directly reached. If you still have difficulty reaching the provider, please page the [facility] (Director on  Call) for the Hospitalists listed on amion for assistance.

## 2022-11-16 NOTE — Plan of Care (Signed)

## 2022-11-16 NOTE — Consult Note (Signed)
Urology Consult Note   Requesting Attending Physician:  Hughie Closs, MD Service Providing Consult: Urology  Consulting Attending: Di Kindle   Reason for Consult:  right hydronephrosis  HPI: Laura [name] is seen in consultation for reasons noted above at the request of Hughie Closs, MD for evaluation of right hydronephrosis.  This is a 76 year old female with past medical history significant for ovarian malignancy and subsequent extrinsic ureteral compression resulting in right-sided hydronephrosis.  Patient had previously been managed by right ureteral stents.  Patient underwent attempted exchange in October 2024 with Dr. Arita Miss, but this was unsuccessful.  The patient ultimately opted to have a trial without a stent to see how she was doing.  She presented to the hospital yesterday with ongoing intractable right flank pain for the past few days.  On admission, she was given ceftriaxone for presumed UTI.  Her urinalysis was equivocal, showing large leukocyte Estrace, rare bacteria, and greater than 50 blood cells.   Past Medical History: Past Medical History:  Diagnosis Date   Anemia    Anxiety    Arthritis    back of neck, bones spurs on neck   Cancer (HCC)    ovarian cancer   Cataract both eyes    Cervical disc disease    Diabetes mellitus Type 2    Dyspnea    with exertion   Family history of adverse reaction to anesthesia    sister slow to awaken   Family history of thyroid cancer    GERD (gastroesophageal reflux disease)    Heart murmur    History of COVID-19 06/2020   pain in side took paxlovid x 5 days all symptoms resolved   History of radiation therapy    abdominal lymph nodes 01/21/2021-02/01/2021  Dr Antony Blackbird   Hydronephrosis 08/24/2020   Right Kidney (stable per CT)   Hyperlipidemia    Hypertension    IBS (irritable bowel syndrome)    Left shoulder frozen with limited rom    Mucoid cyst of joint    right thumb   Neuropathy 03/12/2021    hands/fingers both hands numb and tingle @ times   Peripheral vascular disease (HCC)    Port-A-Cath in place 07/21/2019   Reflux    Sleep apnea 03/12/2021   has not used cpap in 3 years   Vertigo 12/2010   none since treated at [facility]   Wears glasses for reading    Wears partial dentures upper     Past Surgical History:  Past Surgical History:  Procedure Laterality Date   BLADDER SURGERY     x 3 at [facility]   BREAST EXCISIONAL BIOPSY Bilateral    BREAST SURGERY     fibroid cyst removed ? over 10 yrs ago at [facility] per pt on 03-12-2021   CHOLECYSTECTOMY     yrs ago   COLONOSCOPY  07/02/2020   and 09-19-2016   CYSTOSCOPY W/ URETERAL STENT PLACEMENT Right 03/13/2021   Procedure: CYSTOSCOPY WITH RETROGRADE PYELOGRAM/URETERAL STENT PLACEMENT;  Surgeon: Noel Christmas, MD;  Location: [facility];  Service: Urology;  Laterality: Right;  30 MINS   CYSTOSCOPY W/ URETERAL STENT PLACEMENT Right 10/01/2021   Procedure: CYSTOSCOPY WITH RETROGRADE PYELOGRAM/URETERAL STENT REPLACEMENT;  Surgeon: Noel Christmas, MD;  Location: [facility] [place];  Service: Urology;  Laterality: Right;   CYSTOSCOPY W/ URETERAL STENT PLACEMENT Bilateral 03/18/2022   Procedure: CYSTOSCOPY WITH BILATERAL RETROGRADE PYELOGRAM/RIGHT URETERAL STENT EXCHANGE;  Surgeon: Noel Christmas, MD;  Location:  [facility];  Service: Urology;  Laterality: Bilateral;  1HR   CYSTOSCOPY WITH RETROGRADE PYELOGRAM, URETEROSCOPY AND STENT PLACEMENT Right 05/06/2022   Procedure: CYSTOSCOPY WITH RIGHT RETROGRADE PYELOGRAM, AND RIGHT URETERAL STENT EXCHANGE;  Surgeon: Noel Christmas, MD;  Location: [facility];  Service: Urology;  Laterality: Right;   CYSTOSCOPY WITH RETROGRADE PYELOGRAM, URETEROSCOPY AND STENT PLACEMENT Right 10/21/2022   Procedure: CYSTOSCOPY, RIGHT  RETROGRADE PYELOGRAM AND RIGHT STENT REMOVAL;  Surgeon: Noel Christmas, MD;  Location: [facility];  Service: Urology;  Laterality: Right;   fibroids removed     breast  (both breasts) age 39 and total of 3 surgeries   history of chemotherapy     6 rounds june 2021   IR IMAGING GUIDED PORT INSERTION  07/26/2019   right   MASS EXCISION Right 06/26/2016   Procedure: EXCISION MUCOID TUMOR RIGHT THUMB, IP RIGHT THUMB;  Surgeon: Cindee Salt, MD;  Location: [facility];  Service: Orthopedics;  Laterality: Right;   ROBOTIC ASSISTED BILATERAL SALPINGO OOPHERECTOMY N/A 01/03/2020   Procedure: XI ROBOTIC ASSISTED BILATERAL SALPINGO OOPHORECTOMY, RADICAL TUMOR DEBULKING;  Surgeon: Adolphus Birchwood, MD;  Location: [facility];  Service: Gynecology;  Laterality: N/A;   ROBOTIC PELVIC AND PARA-AORTIC LYMPH NODE DISSECTION N/A 01/03/2020   Procedure: XI ROBOTIC PARA-AORTIC LYMPHADENECTOMY;  Surgeon: Adolphus Birchwood, MD;  Location: [facility];  Service: Gynecology;  Laterality: N/A;   VAGINAL HYSTERECTOMY     age late 55's    Medication: Current Facility-Administered Medications  Medication Dose Route Frequency Provider Last Rate Last Admin   acetaminophen (TYLENOL) tablet 650 mg  650 mg Oral Q6H PRN Alan Mulder, MD   650 mg at 11/16/22 1191   Or   acetaminophen (TYLENOL) suppository 650 mg  650 mg Rectal Q6H PRN Alan Mulder, MD       amLODipine (NORVASC) tablet 5 mg  5 mg Oral Daily Dorrell, Robert, MD   5 mg at 11/16/22 0805   cefTRIAXone (ROCEPHIN) 2 g in sodium chloride 0.9 % 100 mL IVPB  2 g Intravenous Q24H Alan Mulder, MD 200 mL/hr at 11/16/22 0818 2 g at 11/16/22 0818   Chlorhexidine Gluconate Cloth 2 % PADS 6 each  6 each Topical Daily Hughie Closs, MD   6 each at 11/16/22 0819   enoxaparin (LOVENOX) injection 30 mg  30 mg Subcutaneous Q24H Laureen Ochs K, RPH   30 mg at 11/15/22 2203   fentaNYL (SUBLIMAZE) injection 12.5-50 mcg  12.5-50 mcg Intravenous Q2H PRN Alan Mulder, MD   50 mcg at 11/16/22 0346   fesoterodine (TOVIAZ) tablet 4 mg  4 mg Oral Daily Pricilla Riffle, RPH   4 mg at 11/16/22 0814   LORazepam (ATIVAN) injection 0.5 mg  0.5 mg  Intravenous Q4H PRN Alan Mulder, MD       ondansetron (ZOFRAN) tablet 4 mg  4 mg Oral Q6H PRN Alan Mulder, MD       Or   ondansetron (ZOFRAN) injection 4 mg  4 mg Intravenous Q6H PRN Dorrell, Robert, MD       oxyCODONE (Oxy IR/ROXICODONE) immediate release tablet 5 mg  5 mg Oral Q4H PRN Alan Mulder, MD   5 mg at 11/16/22 0813   traZODone (DESYREL) tablet 50 mg  50 mg Oral QHS PRN Alan Mulder, MD   50 mg at 11/15/22 2202    Allergies: Allergies  Allergen Reactions   Ciprofloxacin Swelling    Torn tendon   Hydrocodone Bit-Homatrop Mbr Other (See Comments)  Vertigo *pt strongly prefers to never take* took 3 years to recover from   Prednisone Anxiety    *pt strongly prefers to never be given prednisone*    Sulfa Antibiotics Hives, Itching and Swelling    Tongue swells   Alfuzosin Other (See Comments)    Weakness and fatigue   Codeine Itching   Crestor [Rosuvastatin Calcium] Other (See Comments)    Did something to memory     Doxycycline Other (See Comments)    Severe rectal Gas.   Gabapentin Other (See Comments)    disoriented   Keflex [Cephalexin] Diarrhea and Nausea And Vomiting   Myrbetriq [facility]] Swelling    Swelling of legs and feet   Naproxen Other (See Comments)    Stomach cramps/loud gas   Statins Other (See Comments)    Muscle weakness    Social History: Social History   Tobacco Use   Smoking status: Never   Smokeless tobacco: Never  Vaping Use   Vaping status: Never Used  Substance Use Topics   Alcohol use: No    Alcohol/week: 0.0 standard drinks of alcohol   Drug use: No    Family History Family History  Problem Relation Age of Onset   Hypertension Mother    Diabetes Father    Hypertension Father    Diabetes Sister    Breast cancer Sister        stage 0   Hypertension Sister    Stroke Sister    Diabetes Maternal Aunt    Thyroid cancer Daughter 84   Hypertension Other    Colon cancer Neg Hx    Esophageal cancer Neg Hx     Stomach cancer Neg Hx    Rectal cancer Neg Hx    Endometrial cancer Neg Hx    Ovarian cancer Neg Hx     Review of Systems 10 systems were reviewed and are negative except as noted specifically in the HPI.  Objective   Vital signs in last 24 hours: BP (!) 145/64 (BP Location: Right Arm)   Pulse 83   Temp 98.2 F (36.8 C) (Oral)   Resp 18   Ht 5\' 3"  (1.6 m)   Wt 82.1 kg   SpO2 97%   BMI 32.06 kg/m   Physical Exam General: NAD, A&O, resting, appropriate HEENT: [place]/AT, EOMI, MMM Pulmonary: Normal work of breathing Cardiovascular: HDS, adequate peripheral perfusion Abdomen: Right flank pain endorsed with palpaption GU: Voiding spontaneously Extremities: warm and well perfused Neuro: Appropriate, no focal neurological deficits  Most Recent Labs: Lab Results  Component Value Date   WBC 10.0 11/16/2022   HGB 9.0 (L) 11/16/2022   HCT 27.4 (L) 11/16/2022   PLT 180 11/16/2022    Lab Results  Component Value Date   NA 131 (L) 11/16/2022   K 3.5 11/16/2022   CL 101 11/16/2022   CO2 21 (L) 11/16/2022   BUN 29 (H) 11/16/2022   CREATININE 1.84 (H) 11/16/2022   CALCIUM 8.7 (L) 11/16/2022   MG 2.1 10/30/2022    Lab Results  Component Value Date   INR 1.0 06/06/2019     Urine Culture: @[id number] (laburin,org,[id number],[id number])@   IMAGING: CT ABDOMEN WO CONTRAST  Result Date: 11/15/2022 CLINICAL DATA:  Flank pain. History of ovarian cancer and chemotherapy. EXAM: CT ABDOMEN WITHOUT CONTRAST TECHNIQUE: Multidetector CT imaging of the abdomen was performed following the standard protocol without IV contrast. RADIATION DOSE REDUCTION: This exam was performed according to the departmental dose-optimization program which includes automated exposure control, adjustment  of the mA and/or kV according to patient size and/or use of iterative reconstruction technique. COMPARISON:  CT abdomen pelvis dated 08/25/2022. FINDINGS: Evaluation of this exam is limited in the absence of  intravenous contrast. Lower chest: There are bibasilar linear atelectasis/scarring. Trace bilateral pleural effusions suspected. There is 3 vessel coronary vascular calcification. No intra-abdominal free air. Hepatobiliary: The liver is unremarkable. No biliary ductal dilatation. Cholecystectomy. No retained calcified stone noted in the central CBD. Pancreas: Unremarkable. No pancreatic ductal dilatation or surrounding inflammatory changes. Spleen: Normal in size without focal abnormality. Adrenals/Urinary Tract: The adrenal glands are unremarkable. There is mild left hydronephrosis similar to prior CT. No stone identified in the left kidney or the visualized left ureter. There is severe right hydronephrosis, significantly progressed since the prior CT. Interval removal of the previously seen right ureteral stent. No stone identified. Stomach/Bowel: There is a small hiatal hernia. Dense stool noted throughout the colon. No bowel dilatation in the visualized abdomen. Vascular/Lymphatic: Moderate aortoiliac atherosclerotic disease. Retroperitoneal adenopathy encasing the distal abdominal aorta. There is a 5.6 x 4.7 cm mass inferior to the right renal hilum contiguous with the right paraspinal/retroperitoneal mass. Overall similar or slightly increased since the prior CT. Other: None Musculoskeletal: Osteopenia with degenerative changes. No acute osseous pathology. IMPRESSION: 1. Interval removal of the previously seen right ureteral stent with development of severe right hydronephrosis. 2. Retroperitoneal adenopathy similar or slightly progressed since the prior CT. 3. Mild left hydronephrosis similar to prior CT. 4. Aortic Atherosclerosis (ICD10-I70.0). Electronically Signed   By: Elgie Collard M.D.   On: 11/15/2022 15:36    ------  Assessment:  77 y.o. female with past medical history significant for ovarian cancer, type 2 diabetes, GERD, and ureteral obstruction secondary to her known ovarian malignancy  who presented to the emergency department yesterday with intractable right flank pain CT scan showing worsening hydronephrosis.  Reassuringly, the patient is hemodynamically stable.  She does have an elevated creatinine compared to her baseline.  In the setting of prior failed ureteral stent exchange, would recommend right nephrostomy tube at this time for decompression not only for pain relief, but also in the setting of a UA that is concerning for infection.   Recommendations: -Recommend right nephrostomy tube placement -Pt will need to be NPO prior to procedure -follow-up urine culture -Urology will continue to follow along   Thank you for this consult. Please contact the urology consult pager with any further questions/concerns.

## 2022-11-16 NOTE — Consult Note (Signed)
Chief Complaint: Patient was seen in consultation today for R PCN placement  Chief Complaint  Patient presents with   Back Pain   at the request of Pahwani, Ravi/ Zettie Pho (urology)  Referring Physician(s): Jacqulyn Bath, Ravi/ Zettie Pho (urology)  Supervising Physician: Gilmer Mor  Patient Status: [facility] - In-pt  History of Present Illness: Laura [name] is a 77 y.o. female with PMHs of HTN, type 2 diabetes, GERD, IBS, ovarian cancer with right hydronephrosis previously managed by right ureteral stents, unsuccessful exchange on 10/21/22, IR was consulted for R PCN placement.   Patient underwent right ureteral stent exchange om 10/8 which was unsuccessful, patient opted to have a trial without a stent. She presented to ED yesterday with intractable back/flank pain, CT Abdomen w/o showed severe right hydronephrosis. Patient was hemodynamically stable, labs showed leukocytosis and UA concerning for UTI. Patient was started on abx and admitted, urology was consulted who recommended IR consult for /r PCN placement. Case reviewed and approved by Dr. Loreta Ave.   Patient laying in bed, not in acute distress.  Denise headache, fever, chills, shortness of breath, cough, chest pain, abdominal pain, nausea ,vomiting, and bleeding. Main concern is that how uncomfortable the PCN will be. No other complaints.    Past Medical History:  Diagnosis Date   Anemia    Anxiety    Arthritis    back of neck, bones spurs on neck   Cancer (HCC)    ovarian cancer   Cataract both eyes    Cervical disc disease    Diabetes mellitus Type 2    Dyspnea    with exertion   Family history of adverse reaction to anesthesia    sister slow to awaken   Family history of thyroid cancer    GERD (gastroesophageal reflux disease)    Heart murmur    History of COVID-19 06/2020   pain in side took paxlovid x 5 days all symptoms resolved   History of radiation therapy    abdominal lymph nodes  01/21/2021-02/01/2021  Dr Antony Blackbird   Hydronephrosis 08/24/2020   Right Kidney (stable per CT)   Hyperlipidemia    Hypertension    IBS (irritable bowel syndrome)    Left shoulder frozen with limited rom    Mucoid cyst of joint    right thumb   Neuropathy 03/12/2021   hands/fingers both hands numb and tingle @ times   Peripheral vascular disease (HCC)    Port-A-Cath in place 07/21/2019   Reflux    Sleep apnea 03/12/2021   has not used cpap in 3 years   Vertigo 12/2010   none since treated at [facility]   Wears glasses for reading    Wears partial dentures upper     Past Surgical History:  Procedure Laterality Date   BLADDER SURGERY     x 3 at [facility]   BREAST EXCISIONAL BIOPSY Bilateral    BREAST SURGERY     fibroid cyst removed ? over 10 yrs ago at [facility] per pt on 03-12-2021   CHOLECYSTECTOMY     yrs ago   COLONOSCOPY  07/02/2020   and 09-19-2016   CYSTOSCOPY W/ URETERAL STENT PLACEMENT Right 03/13/2021   Procedure: CYSTOSCOPY WITH RETROGRADE PYELOGRAM/URETERAL STENT PLACEMENT;  Surgeon: Noel Christmas, MD;  Location: [facility];  Service: Urology;  Laterality: Right;  30 MINS   CYSTOSCOPY W/ URETERAL STENT PLACEMENT Right 10/01/2021   Procedure: CYSTOSCOPY WITH RETROGRADE PYELOGRAM/URETERAL STENT REPLACEMENT;  Surgeon: Noel Christmas,  MD;  Location: [facility];  Service: Urology;  Laterality: Right;   CYSTOSCOPY W/ URETERAL STENT PLACEMENT Bilateral 03/18/2022   Procedure: CYSTOSCOPY WITH BILATERAL RETROGRADE PYELOGRAM/RIGHT URETERAL STENT EXCHANGE;  Surgeon: Noel Christmas, MD;  Location: [facility];  Service: Urology;  Laterality: Bilateral;  1HR   CYSTOSCOPY WITH RETROGRADE PYELOGRAM, URETEROSCOPY AND STENT PLACEMENT Right 05/06/2022   Procedure: CYSTOSCOPY WITH RIGHT RETROGRADE PYELOGRAM, AND RIGHT URETERAL STENT EXCHANGE;  Surgeon: Noel Christmas, MD;  Location: [facility];  Service: Urology;  Laterality: Right;   CYSTOSCOPY WITH RETROGRADE PYELOGRAM,  URETEROSCOPY AND STENT PLACEMENT Right 10/21/2022   Procedure: CYSTOSCOPY, RIGHT  RETROGRADE PYELOGRAM AND RIGHT STENT REMOVAL;  Surgeon: Noel Christmas, MD;  Location: [facility];  Service: Urology;  Laterality: Right;   fibroids removed     breast (both breasts) age 37 and total of 3 surgeries   history of chemotherapy     6 rounds june 2021   IR IMAGING GUIDED PORT INSERTION  07/26/2019   right   MASS EXCISION Right 06/26/2016   Procedure: EXCISION MUCOID TUMOR RIGHT THUMB, IP RIGHT THUMB;  Surgeon: Cindee Salt, MD;  Location:  S[facility]ERY [facility];  Service: Orthopedics;  Laterality: Right;   ROBOTIC ASSISTED BILATERAL SALPINGO OOPHERECTOMY N/A 01/03/2020   Procedure: XI ROBOTIC ASSISTED BILATERAL SALPINGO OOPHORECTOMY, RADICAL TUMOR DEBULKING;  Surgeon: Adolphus Birchwood, MD;  Location: [facility];  Service: Gynecology;  Laterality: N/A;   ROBOTIC PELVIC AND PARA-AORTIC LYMPH NODE DISSECTION N/A 01/03/2020   Procedure: XI ROBOTIC PARA-AORTIC LYMPHADENECTOMY;  Surgeon: Adolphus Birchwood, MD;  Location: [facility];  Service: Gynecology;  Laterality: N/A;   VAGINAL HYSTERECTOMY     age late 53's    Allergies: Ciprofloxacin, Hydrocodone bit-homatrop mbr, Prednisone, Sulfa antibiotics, Alfuzosin, Codeine, Crestor [rosuvastatin calcium], Doxycycline, Gabapentin, Keflex [cephalexin], Myrbetriq [mirabegron er], Naproxen, and Statins  Medications: Prior to Admission medications   Medication Sig Start Date End Date Taking? Authorizing Provider  acetaminophen (TYLENOL) 500 MG tablet Take 1 tablet (500 mg total) by mouth every 6 (six) hours as needed. Patient taking differently: Take 500 mg by mouth every 6 (six) hours as needed for mild pain (pain score 1-3) or moderate pain (pain score 4-6). 05/16/20  Yes Kerrin Champagne, MD  amLODipine (NORVASC) 10 MG tablet Take 1 tablet (10 mg total) by mouth daily. Patient taking differently: Take 5 mg by mouth daily. 07/29/22  Yes Doreatha Massed, MD  bismuth  subsalicylate (PEPTO BISMOL) 262 MG/15ML suspension Take 30 mLs by mouth every 6 (six) hours as needed for indigestion or diarrhea or loose stools.   Yes [provider]  docusate sodium (COLACE) 100 MG capsule Take 2 capsules (200 mg total) by mouth 2 (two) times daily. Patient taking differently: Take 100 mg by mouth as needed for moderate constipation. 07/01/22  Yes Rojelio Brenner M, PA-C  famotidine-calcium carbonate-magnesium hydroxide (PEPCID COMPLETE) 10-800-165 MG chewable tablet Chew 1 tablet by mouth daily.   Yes [provider]  furosemide (LASIX) 20 MG tablet TAKE 1 TABLET (20MG ) BY MOUTH DAILYAS NEEDED FOR EDEMA (SWELLING) Patient taking differently: Take 20 mg by mouth daily. 09/28/22  Yes Pennington, Rebekah M, PA-C  glimepiride (AMARYL) 2 MG tablet TAKE 1/2 TABLET (1MG ) BY MOUTH ONCE DAILY BEFORE BREAKFAST 08/21/22  Yes Corwin Levins, MD  HYDROmorphone (DILAUDID) 4 MG tablet Take 4 mg by mouth every 4 (four) hours as needed for severe pain (PRN as needed.).   Yes [provider]  lidocaine (LIDODERM) 5 %  Place 1 patch onto the skin daily. Remove & Discard patch within 12 hours or as directed by MD Patient taking differently: Place 1-3 patches onto the skin daily as needed (pain). Remove & Discard patch within 12 hours or as directed by MD 09/01/22  Yes Doreatha Massed, MD  loperamide (IMODIUM A-D) 2 MG tablet Take 2-6 mg by mouth as needed for diarrhea or loose stools.   Yes [provider]  loratadine (CLARITIN) 10 MG tablet Take 10 mg by mouth daily.   Yes [provider]  meloxicam (MOBIC) 7.5 MG tablet Take 7.5 mg by mouth daily. 11/10/22  Yes [provider]  prochlorperazine (COMPAZINE) 10 MG tablet Take 1 tablet (10 mg total) by mouth every 6 (six) hours as needed for nausea or vomiting. 07/14/22  Yes Doreatha Massed, MD  solifenacin (VESICARE) 5 MG tablet Take 1 tablet (5 mg total) by mouth daily. 10/13/22  Yes    traMADol (ULTRAM) 50 MG tablet Take 1 every 6 hours for pain not relieved by Tylenol alone Patient taking differently: Take 50 mg by mouth every 6 (six) hours as needed for moderate pain (pain score 4-6) or severe pain (pain score 7-10). 11/12/22  Yes Bethann Berkshire, MD  traZODone (DESYREL) 100 MG tablet 1/2 - 1 tab by mouth at bedtime for sleep as needed Patient taking differently: Take 50 mg by mouth at bedtime as needed for sleep. 08/26/22  Yes Corwin Levins, MD  Blood Glucose Monitoring Suppl (ONE TOUCH ULTRA 2) w/Device KIT Use as directed 04/06/15   Corwin Levins, MD  glucose blood [facility] ULTRA) test strip USE TO CHECK BLOOD SUGARS TWO TIMES DAILY 03/22/21   Corwin Levins, MD  Lancets [facility] ULTRASOFT) lancets 1 each by Other route as needed for other. Use as instructed 03/20/21   Corwin Levins, MD  megestrol (MEGACE) 40 MG/ML suspension Take by mouth daily. Patient not taking: Reported on 11/15/2022    [provider]     Family History  Problem Relation Age of Onset   Hypertension Mother    Diabetes Father    Hypertension Father    Diabetes Sister    Breast cancer Sister        stage 0   Hypertension Sister    Stroke Sister    Diabetes Maternal Aunt    Thyroid cancer Daughter 43   Hypertension Other    Colon cancer Neg Hx    Esophageal cancer Neg Hx    Stomach cancer Neg Hx    Rectal cancer Neg Hx    Endometrial cancer Neg Hx    Ovarian cancer Neg Hx     Social History   Socioeconomic History   Marital status: Single    Spouse name: Not on file   Number of children: 4   Years of education: 16   Highest education level: Bachelor's degree (e.g., BA, AB, BS)  Occupational History   Occupation: retired Photographer  Tobacco Use   Smoking status: Never   Smokeless tobacco: Never  Vaping Use   Vaping status: Never Used  Substance and Sexual Activity   Alcohol use: No    Alcohol/week: 0.0 standard drinks of alcohol   Drug use: No   Sexual  activity: Not Currently    Birth control/protection: Surgical  Other Topics Concern   Not on file  Social History Narrative   Not on file   Social Determinants of Health   Financial Resource Strain: Low Risk  (  11/13/2022)   Overall Financial Resource Strain (CARDIA)    Difficulty of Paying Living Expenses: Not hard at all  Food Insecurity: No Food Insecurity (11/15/2022)   Hunger Vital Sign    Worried About Running Out of Food in the Last Year: Never true    Ran Out of Food in the Last Year: Never true  Transportation Needs: No Transportation Needs (11/15/2022)   PRAPARE - Administrator, Civil Service (Medical): No    Lack of Transportation (Non-Medical): No  Physical Activity: Unknown (11/13/2022)   Exercise Vital Sign    Days of Exercise per Week: 0 days    Minutes of Exercise per Session: Not on file  Stress: Stress Concern Present (11/13/2022)   [employer] of Occupational Health - Occupational Stress Questionnaire    Feeling of Stress : Rather much  Social Connections: Moderately Integrated (11/13/2022)   Social Connection and Isolation Panel [NHANES]    Frequency of Communication with Friends and Family: More than three times a week    Frequency of Social Gatherings with Friends and Family: More than three times a week    Attends Religious Services: More than 4 times per year    Active Member of [employer] or Organizations: Yes    Attends Banker Meetings: 1 to 4 times per year    Marital Status: Divorced     Review of Systems: A 12 point ROS discussed and pertinent positives are indicated in the HPI above.  All other systems are negative.  Vital Signs: BP (!) 145/64 (BP Location: Right Arm)   Pulse 83   Temp 98.2 F (36.8 C) (Oral)   Resp 18   Ht 5\' 3"  (1.6 m)   Wt 181 lb (82.1 kg)   SpO2 97%   BMI 32.06 kg/m    Physical Exam Vitals and nursing note reviewed.  Constitutional:      General: Patient is not in acute distress.     Appearance: Normal appearance. Patient is not ill-appearing.  HENT:     Head: Normocephalic and atraumatic.     Mouth/Throat:     Mouth: Mucous membranes are moist.     Pharynx: Oropharynx is clear.  Cardiovascular:     Rate and Rhythm: Normal rate and regular rhythm.     Pulses: Normal pulses.     Heart sounds: Normal heart sounds.  Pulmonary:     Effort: Pulmonary effort is normal.     Breath sounds: Normal breath sounds.  Abdominal:     General: Abdomen is flat. Bowel sounds are normal.     Palpations: Abdomen is soft.  Musculoskeletal:     Cervical back: Neck supple.  Skin:    General: Skin is warm and dry.     Coloration: Skin is not jaundiced or pale.  Neurological:     Mental Status: Patient is alert and oriented to person, place, and time.  Psychiatric:        Mood and Affect: Mood normal.        Behavior: Behavior normal.        Judgment: Judgment normal.    MD Evaluation Airway: WNL Heart: WNL Abdomen: WNL Chest/ Lungs: WNL ASA  Classification: 3 Mallampati/Airway Score: Two  Imaging: CT ABDOMEN WO CONTRAST  Result Date: 11/15/2022 CLINICAL DATA:  Flank pain. History of ovarian cancer and chemotherapy. EXAM: CT ABDOMEN WITHOUT CONTRAST TECHNIQUE: Multidetector CT imaging of the abdomen was performed following the standard protocol without IV contrast. RADIATION DOSE REDUCTION: This  exam was performed according to the departmental dose-optimization program which includes automated exposure control, adjustment of the mA and/or kV according to patient size and/or use of iterative reconstruction technique. COMPARISON:  CT abdomen pelvis dated 08/25/2022. FINDINGS: Evaluation of this exam is limited in the absence of intravenous contrast. Lower chest: There are bibasilar linear atelectasis/scarring. Trace bilateral pleural effusions suspected. There is 3 vessel coronary vascular calcification. No intra-abdominal free air. Hepatobiliary: The liver is unremarkable. No  biliary ductal dilatation. Cholecystectomy. No retained calcified stone noted in the central CBD. Pancreas: Unremarkable. No pancreatic ductal dilatation or surrounding inflammatory changes. Spleen: Normal in size without focal abnormality. Adrenals/Urinary Tract: The adrenal glands are unremarkable. There is mild left hydronephrosis similar to prior CT. No stone identified in the left kidney or the visualized left ureter. There is severe right hydronephrosis, significantly progressed since the prior CT. Interval removal of the previously seen right ureteral stent. No stone identified. Stomach/Bowel: There is a small hiatal hernia. Dense stool noted throughout the colon. No bowel dilatation in the visualized abdomen. Vascular/Lymphatic: Moderate aortoiliac atherosclerotic disease. Retroperitoneal adenopathy encasing the distal abdominal aorta. There is a 5.6 x 4.7 cm mass inferior to the right renal hilum contiguous with the right paraspinal/retroperitoneal mass. Overall similar or slightly increased since the prior CT. Other: None Musculoskeletal: Osteopenia with degenerative changes. No acute osseous pathology. IMPRESSION: 1. Interval removal of the previously seen right ureteral stent with development of severe right hydronephrosis. 2. Retroperitoneal adenopathy similar or slightly progressed since the prior CT. 3. Mild left hydronephrosis similar to prior CT. 4. Aortic Atherosclerosis (ICD10-I70.0). Electronically Signed   By: Elgie Collard M.D.   On: 11/15/2022 15:36   DG Lumbar Spine Complete  Result Date: 11/12/2022 CLINICAL DATA:  Low back pain.  Right hip/leg pain. EXAM: LUMBAR SPINE - COMPLETE 4+ VIEW COMPARISON:  None Available. FINDINGS: There are 5 nonrib-bearing lumbar vertebrae. Anatomic lumbar curvature. No spondylolysis. There is grade 1 anterolisthesis of L4 over L5, similar to the prior study. Vertebral body heights are maintained. No aggressive osseous lesion. Mild-moderate multilevel  degenerative changes in the form of reduced intervertebral disc height, endplate sclerosis/irregularity, facet arthropathy and marginal osteophyte formation. Sacroiliac joints are symmetric. Visualized soft tissues are within normal limits. There are surgical clips in the right upper quadrant, typical of a previous cholecystectomy. IMPRESSION: *No acute osseous abnormality of the lumbar spine. *Mild-to-moderate multilevel degenerative changes, as described above. Electronically Signed   By: Jules Schick M.D.   On: 11/12/2022 08:44   DG C-Arm 1-60 Min-No Report  Result Date: 10/21/2022 Fluoroscopy was utilized by the requesting physician.  No radiographic interpretation.    Labs:  CBC: Recent Labs    10/21/22 1130 10/30/22 1120 11/15/22 1234 11/16/22 0426  WBC 7.6 7.2 12.3* 10.0  HGB 13.1 11.6* 10.3* 9.0*  HCT 39.6 34.2* 30.4* 27.4*  PLT 206 189 206 180    COAGS: No results for input(s): "INR", "APTT" in the last 8760 hours.  BMP: Recent Labs    10/21/22 1130 10/30/22 1120 11/15/22 1234 11/16/22 0426  NA 136 132* 127* 131*  K 3.8 3.7 3.8 3.5  CL 107 103 98 101  CO2 19* 18* 20* 21*  GLUCOSE 96 194* 165* 87  BUN 25* 25* 38* 29*  CALCIUM 9.8 9.2 9.1 8.7*  CREATININE 1.23* 1.55* 1.91* 1.84*  GFRNONAA 45* 34* 27* 28*    LIVER FUNCTION TESTS: Recent Labs    09/09/22 1954 09/10/22 0823 09/14/22 1647 10/30/22 1120  BILITOT 0.3  0.6 0.6 0.5  AST 32 30 30 21   ALT 9 13 14 14   ALKPHOS 68 68 67 54  PROT 7.9 7.4 7.3 7.0  ALBUMIN 4.3 3.8 3.8 3.7    TUMOR MARKERS: No results for input(s): "AFPTM", "CEA", "CA199", "CHROMGRNA" in the last 8760 hours.  Assessment and Plan: 77 y.o. female with obstructing pelvic mass s/p right ureteral stent placement, failed exchange on 10/8, who presents for R PCN placement due to severe right hydronephrosis and back//flank pain.   VSS CBC WBC 10.0, hgb 9.0, plt 180 RF stable  INR pending  Not on AC/AP 13 allergies, not allergic to  Fentanyl, Versed, chlorhexidine, lidocaine  On rocephin 2 g every day, additional abx per performing radiologist.  Risks and benefits of right PCN placement was discussed with the patient including, but not limited to, infection, bleeding, significant bleeding causing loss or decrease in renal function or damage to adjacent structures.   All of the patient's questions were answered, patient is agreeable to proceed.  Consent signed and in chart.  The procedure is tentatively scheduled for tomorrow pending IR schedule.  Patient is aware that IR may not be able to accommodate tomorrow.  PLAN - NPO except meds at [facility] - No AC/AP till procedure is done  - IR will call when ready     Thank you for this interesting consult.  I greatly enjoyed meeting GIAVANA ROOKE and look forward to participating in their care.  A copy of this report was sent to the requesting provider on this date.  Electronically Signed: Willette Brace, PA-C 11/16/2022, 12:15 PM   I spent a total of 40 Minutes    in face to face in clinical consultation, greater than 50% of which was counseling/coordinating care for R PCN placement.   This chart was dictated using voice recognition software.  Despite best efforts to proofread,  errors can occur which can change the documentation meaning.

## 2022-11-16 NOTE — Consult Note (Signed)
   Palliative Care Consult Note                                  Date: 11/16/2022   Patient Name: Laura [name]  DOB: 05-Jun-1945  MRN: [id number]  Age / Sex: 77 y.o., female  PCP: Corwin Levins, MD Referring Physician: Hughie Closs, MD  Reason for Consultation: {Reason for Consult:23484}  HPI/Patient Profile: 77 y.o. female  with past medical history of *** admitted on 11/15/2022 with ***.     Subjective:   I have reviewed medical records including EPIC notes, labs and imaging, received report from the team, and assessed the patient at bedside.   I met with *** to discuss diagnosis, prognosis, GOC, EOL wishes, disposition, and options.  I introduced Palliative Medicine as specialized medical care for people living with serious illness. It focuses on providing relief from the symptoms and stress of a serious illness.   We discussed patient's current illness and what it means in the larger context of his/her ongoing co-morbidities. Current clinical status was reviewed. Natural disease trajectory of *** was discussed.  Created space and opportunity for patient and family to explore thoughts and feelings regarding current medical situation.  Values and goals of care important to patient and family were attempted to be elicited.  A discussion was had today regarding advanced directives. Concepts specific to code status, artifical feeding and hydration, continued IV antibiotics and rehospitalization was had.  The MOST form was introduced and discussed.  Questions and concerns addressed. Patient/family encouraged to call with questions or concerns.     Life Review: ***  Functional Status: ***  Patient/Family Understanding of Illness: ***  Patient Values: ***  Goals: ***  Additional Discussion: ***  Review of Systems  Objective:   Primary Diagnoses: Present on Admission:  Cancer-related breakthrough pain   Physical  Exam  Vital Signs:  BP (!) 146/78 (BP Location: Right Arm)   Pulse 92   Temp 98 F (36.7 C) (Oral)   Resp 18   Ht 5\' 3"  (1.6 m)   Wt 82.1 kg   SpO2 96%   BMI 32.06 kg/m   Palliative Assessment/Data: ***     Assessment & Plan:   SUMMARY OF RECOMMENDATIONS   Code status changed to DNR/DNI Continue current pain regimen -   Primary Decision Maker: {Primary Decision IOEVO:35009}  Code Status/Advance Care Planning: {Palliative Code status:23503}  Symptom Management:  ***  Prognosis:  {Palliative Care Prognosis:23504}  Discharge Planning:  {Palliative dispostion:23505}   Discussed with: ***    Thank you for allowing [place] to participate in the care of Laura [name]   Time Total: ***  Greater than 50%  of this time was spent counseling and coordinating care related to the above assessment and plan.  Signed by: Sherlean Foot, NP Palliative Medicine Team  Team Phone # [phone]  For individual providers, please see AMION

## 2022-11-17 ENCOUNTER — Inpatient Hospital Stay (HOSPITAL_COMMUNITY): Payer: 59

## 2022-11-17 DIAGNOSIS — N1339 Other hydronephrosis: Secondary | ICD-10-CM | POA: Diagnosis not present

## 2022-11-17 DIAGNOSIS — Z515 Encounter for palliative care: Secondary | ICD-10-CM | POA: Diagnosis not present

## 2022-11-17 DIAGNOSIS — G893 Neoplasm related pain (acute) (chronic): Secondary | ICD-10-CM | POA: Diagnosis not present

## 2022-11-17 DIAGNOSIS — C799 Secondary malignant neoplasm of unspecified site: Secondary | ICD-10-CM | POA: Diagnosis not present

## 2022-11-17 HISTORY — PX: IR NEPHROSTOMY PLACEMENT RIGHT: IMG6064

## 2022-11-17 LAB — BASIC METABOLIC PANEL
Anion gap: 12 (ref 5–15)
BUN: 21 mg/dL (ref 8–23)
CO2: 19 mmol/L — ABNORMAL LOW (ref 22–32)
Calcium: 8.9 mg/dL (ref 8.9–10.3)
Chloride: 100 mmol/L (ref 98–111)
Creatinine, Ser: 1.75 mg/dL — ABNORMAL HIGH (ref 0.44–1.00)
GFR, Estimated: 29 mL/min — ABNORMAL LOW (ref >60)
Glucose, Bld: 109 mg/dL — ABNORMAL HIGH (ref 70–99)
Potassium: 3.7 mmol/L (ref 3.5–5.1)
Sodium: 131 mmol/L — ABNORMAL LOW (ref 135–145)

## 2022-11-17 MED ORDER — MIDAZOLAM HCL 2 MG/2ML IJ SOLN
INTRAMUSCULAR | Status: AC | PRN
  Administered 2022-11-17: 1 mg via INTRAVENOUS

## 2022-11-17 MED ORDER — IOHEXOL 300 MG/ML  SOLN
50.0000 mL | Freq: Once | INTRAMUSCULAR | Status: AC | PRN
  Administered 2022-11-17: 15 mL

## 2022-11-17 MED ORDER — FENTANYL CITRATE (PF) 100 MCG/2ML IJ SOLN
INTRAMUSCULAR | Status: AC | PRN
  Administered 2022-11-17: 50 ug via INTRAVENOUS

## 2022-11-17 MED ORDER — LIDOCAINE HCL 1 % IJ SOLN
20.0000 mL | Freq: Once | INTRAMUSCULAR | Status: AC
  Administered 2022-11-17: 15 mL via INTRADERMAL
  Filled 2022-11-17: qty 20

## 2022-11-17 MED ORDER — SODIUM CHLORIDE 0.9% FLUSH
5.0000 mL | Freq: Three times a day (TID) | INTRAVENOUS | Status: DC
  Administered 2022-11-17 – 2022-11-21 (×13): 5 mL

## 2022-11-17 MED ORDER — HYDROMORPHONE HCL 2 MG/ML IJ SOLN
2.0000 mg | INTRAMUSCULAR | Status: DC | PRN
  Administered 2022-11-17 – 2022-11-18 (×5): 2 mg via INTRAVENOUS
  Filled 2022-11-17 (×5): qty 1

## 2022-11-17 MED ORDER — MIDAZOLAM HCL 2 MG/2ML IJ SOLN
INTRAMUSCULAR | Status: AC
  Filled 2022-11-17: qty 4

## 2022-11-17 MED ORDER — FENTANYL CITRATE (PF) 100 MCG/2ML IJ SOLN
INTRAMUSCULAR | Status: AC
  Filled 2022-11-17: qty 2

## 2022-11-17 MED ORDER — HYDROMORPHONE HCL 1 MG/ML IJ SOLN
0.5000 mg | Freq: Once | INTRAMUSCULAR | Status: AC | PRN
  Administered 2022-11-17: 0.5 mg via INTRAVENOUS
  Filled 2022-11-17: qty 0.5

## 2022-11-17 MED ORDER — LIDOCAINE HCL 1 % IJ SOLN
INTRAMUSCULAR | Status: AC
  Filled 2022-11-17: qty 20

## 2022-11-17 NOTE — Progress Notes (Signed)
OT Cancellation Note  Patient Details Name: BELLAH ALIA MRN: [id number] DOB: 22-Oct-1945   Cancelled Treatment:    Reason Eval/Treat Not Completed: Patient at procedure or test/ unavailable Patient with increased pain s/p procedure. OT to continue to follow and check back as schedule will allow.  Rosalio Loud, MS Acute Rehabilitation Department Office# [phone]  11/17/2022, 12:00 PM

## 2022-11-17 NOTE — Progress Notes (Signed)
   11/17/22 1642  Vitals  Temp (!) 101.8 F (38.8 C)  Temp Source Axillary  BP 130/67  MAP (mmHg) 84  BP Location Right Arm  BP Method Automatic  Pulse Rate (!) 114  Resp 20  MEWS COLOR  MEWS Score Color Red  Oxygen Therapy  SpO2 98 %  O2 Device Room Air  Pain Assessment  Pain Scale Faces  Pain Score 4  MEWS Score  MEWS Temp 2  MEWS Systolic 0  MEWS Pulse 2  MEWS RR 0  MEWS LOC 0  MEWS Score 4

## 2022-11-17 NOTE — Procedures (Signed)
Interventional Radiology Procedure Note  Procedure: Right percutaneous nephrostomy  Complications: None  Estimated Blood Loss: < 10 mL  Findings: Upper pole access under US guidance with 18 G trocar needle; return of grossly purulent fluid c/w pyonephrosis. Fluid sample sent for culture.  10 Fr PCN placed and formed in renal pelvis. Connected to gravity bag.  Jodi Marble. Fredia Sorrow, M.D Pager:  [phone]

## 2022-11-17 NOTE — Progress Notes (Signed)
PROGRESS NOTE    Laura [name]  DUK:[id number] DOB: [birth year] DOA: 11/15/2022 PCP: Corwin Levins, MD   Brief Narrative:  HPI: Laura [name] is a 77 y.o. female with medical history significant of ovarian cancer, type 2 diabetes, GERD, IBS who presents emergency department due to intractable pain pain.  Patient was seen by primary care on 10/31.  She was endorsing refractory pain to her home Dilaudid and was given a Toradol injection.  Patient presented to the ER due to refractory symptoms.  Patient states the last 3 days she has been having refractory back pain located over her right flank and right hip with radiation down her legs.  She has had numerous prior presentation to the ER for similar complaints and is taking chronic pain medications at home.  Regarding her cancer she has widespread disease with peritoneal metastasis.  She last saw her oncologist on 10/17.  She is undergoing chemotherapy and radiation does been complicated by right hydronephrosis requiring stent placement.   In the ER lab were obtained which showed sodium 127, creatinine 1.9 baseline 1.3, WBC 12.3, hemoglobin 10.3, platelets 206, urinalysis concerning for infection.  Patient underwent CT abdomen which showed removal of previous placed ureteral stent with severe right hydronephrosis.  Urology was consulted in the emergency department and patient was admitted for further workup.  On admission she was given ceftriaxone for UTI.     Assessment & Plan:   Principal Problem:   Cancer-related breakthrough pain  AKI on CKD stage IIIa/right hydronephrosis/UTI: UA consistent with UTI.  Patient has history of right hydronephrosis, previously had ureteral stent, urology tried to replace but this as outpatient was unsuccessful, she opted to give herself trial of without stent, comes in with right-sided flank pain, secondary to right hydronephrosis.  Has been started on Rocephin, follow culture.  Seen by urology, IR consulted, she is  going to have percutaneous nephrostomy tube on the right today.  Her pain is improving.  History of metastatic ovarian carcinoma with cancer related pain: Pain very well-controlled with resumption of home Dilaudid.  GERD: Continue Pepcid.  Hypertension: Blood pressure fairly controlled.  Continue home amlodipine.  Prediabetes: Hemoglobin A1c 5.9 just 3 weeks ago, she is on diet control.  Blood sugar very well-controlled.  Does not need SSI.  Obstructive sleep apnea: Patient does not use CPAP.  Insomnia: Continue trazodone.  DVT prophylaxis: enoxaparin (LOVENOX) injection 30 mg Start: 11/15/22 2000 SCDs Start: 11/15/22 1636   Code Status: Limited: Do not attempt resuscitation (DNR) -DNR-LIMITED -Do Not Intubate/DNI   Family Communication:  None present at bedside.  Plan of care discussed with patient in length and he/she verbalized understanding and agreed with it.  Status is: Inpatient Remains inpatient appropriate because: Needs nephrostomy tube.   Estimated body mass index is 32.06 kg/m as calculated from the following:   Height as of this encounter: 5\' 3"  (1.6 m).   Weight as of this encounter: 82.1 kg.    Nutritional Assessment: Body mass index is 32.06 kg/m.Marland Kitchen Seen by dietician.  I agree with the assessment and plan as outlined below: Nutrition Status:        . Skin Assessment: I have examined the patient's skin and I agree with the wound assessment as performed by the wound care RN as outlined below:    Consultants:  Urology  Procedures:  None  Antimicrobials:  Anti-infectives (From admission, onward)    Start     Dose/Rate Route Frequency Ordered Stop   11/16/22  1000  cefTRIAXone (ROCEPHIN) 2 g in sodium chloride 0.9 % 100 mL IVPB        2 g 200 mL/hr over 30 Minutes Intravenous Every 24 hours 11/15/22 1658     11/15/22 1515  cefTRIAXone (ROCEPHIN) 1 g in sodium chloride 0.9 % 100 mL IVPB        1 g 200 mL/hr over 30 Minutes Intravenous  Once 11/15/22  1506 11/15/22 1638         Subjective: Patient seen and examined.  Her pain is mild to moderate.  No other complaint.  Objective: Vitals:   11/16/22 0532 11/16/22 1332 11/16/22 2018 11/17/22 0424  BP: (!) 145/64 (!) 146/78 (!) 158/76 (!) 135/98  Pulse: 83 92 100 94  Resp:   18   Temp: 98.2 F (36.8 C) 98 F (36.7 C) 99.5 F (37.5 C) 98.1 F (36.7 C)  TempSrc: Oral Oral Oral Oral  SpO2: 97% 96% 99% 97%  Weight:      Height:        Intake/Output Summary (Last 24 hours) at 11/17/2022 0856 Last data filed at 11/17/2022 0813 Gross per 24 hour  Intake 1084.68 ml  Output 125 ml  Net 959.68 ml   Filed Weights   11/15/22 1111 11/15/22 1753  Weight: 82.1 kg 82.1 kg    Examination:  General exam: Appears calm and comfortable  Respiratory system: Clear to auscultation. Respiratory effort normal. Cardiovascular system: S1 & S2 heard, RRR. No JVD, murmurs, rubs, gallops or clicks.  +1 pitting edema left lower extremity. Gastrointestinal system: Abdomen is nondistended, soft and nontender. No organomegaly or masses felt. Normal bowel sounds heard. Central nervous system: Alert and oriented. No focal neurological deficits. Extremities: Symmetric 5 x 5 power. Skin: No rashes, lesions or ulcers.  Psychiatry: Judgement and insight appear normal. Mood & affect appropriate.   Data Reviewed: I have personally reviewed following labs and imaging studies  CBC: Recent Labs  Lab 11/15/22 1234 11/16/22 0426  WBC 12.3* 10.0  NEUTROABS 10.2*  --   HGB 10.3* 9.0*  HCT 30.4* 27.4*  MCV 91.6 93.5  PLT 206 180   Basic Metabolic Panel: Recent Labs  Lab 11/15/22 1234 11/16/22 0426 11/17/22 0606  NA 127* 131* 131*  K 3.8 3.5 3.7  CL 98 101 100  CO2 20* 21* 19*  GLUCOSE 165* 87 109*  BUN 38* 29* 21  CREATININE 1.91* 1.84* 1.75*  CALCIUM 9.1 8.7* 8.9   GFR: Estimated Creatinine Clearance: 26.9 mL/min (A) (by C-G formula based on SCr of 1.75 mg/dL (H)). Liver Function  Tests: No results for input(s): "AST", "ALT", "ALKPHOS", "BILITOT", "PROT", "ALBUMIN" in the last 168 hours. No results for input(s): "LIPASE", "AMYLASE" in the last 168 hours. No results for input(s): "AMMONIA" in the last 168 hours. Coagulation Profile: Recent Labs  Lab 11/16/22 1207  INR 1.2   Cardiac Enzymes: No results for input(s): "CKTOTAL", "CKMB", "CKMBINDEX", "TROPONINI" in the last 168 hours. BNP (last 3 results) No results for input(s): "PROBNP" in the last 8760 hours. HbA1C: No results for input(s): "HGBA1C" in the last 72 hours. CBG: No results for input(s): "GLUCAP" in the last 168 hours. Lipid Profile: No results for input(s): "CHOL", "HDL", "LDLCALC", "TRIG", "CHOLHDL", "LDLDIRECT" in the last 72 hours. Thyroid Function Tests: No results for input(s): "TSH", "T4TOTAL", "FREET4", "T3FREE", "THYROIDAB" in the last 72 hours. Anemia Panel: No results for input(s): "VITAMINB12", "FOLATE", "FERRITIN", "TIBC", "IRON", "RETICCTPCT" in the last 72 hours. Sepsis Labs: No results for input(s): "  PROCALCITON", "LATICACIDVEN" in the last 168 hours.  Recent Results (from the past 240 hour(s))  Urine Culture     Status: None (Preliminary result)   Collection Time: 11/15/22 12:40 PM   Specimen: Urine, Clean Catch  Result Value Ref Range Status   Specimen Description   Final    URINE, CLEAN CATCH Performed at [facility], [address] [place], [place] [address]    Special Requests   Final    NONE Performed at [facility], [address], [place], [place] [address]    Culture   Final    CULTURE REINCUBATED FOR BETTER GROWTH Performed at [facility], [address], [place], [place] [address]    Report Status PENDING  Incomplete     Radiology Studies: CT ABDOMEN WO CONTRAST  Result Date: 11/15/2022 CLINICAL DATA:  Flank pain. History of ovarian cancer and chemotherapy. EXAM: CT ABDOMEN WITHOUT CONTRAST TECHNIQUE:  Multidetector CT imaging of the abdomen was performed following the standard protocol without IV contrast. RADIATION DOSE REDUCTION: This exam was performed according to the departmental dose-optimization program which includes automated exposure control, adjustment of the mA and/or kV according to patient size and/or use of iterative reconstruction technique. COMPARISON:  CT abdomen pelvis dated 08/25/2022. FINDINGS: Evaluation of this exam is limited in the absence of intravenous contrast. Lower chest: There are bibasilar linear atelectasis/scarring. Trace bilateral pleural effusions suspected. There is 3 vessel coronary vascular calcification. No intra-abdominal free air. Hepatobiliary: The liver is unremarkable. No biliary ductal dilatation. Cholecystectomy. No retained calcified stone noted in the central CBD. Pancreas: Unremarkable. No pancreatic ductal dilatation or surrounding inflammatory changes. Spleen: Normal in size without focal abnormality. Adrenals/Urinary Tract: The adrenal glands are unremarkable. There is mild left hydronephrosis similar to prior CT. No stone identified in the left kidney or the visualized left ureter. There is severe right hydronephrosis, significantly progressed since the prior CT. Interval removal of the previously seen right ureteral stent. No stone identified. Stomach/Bowel: There is a small hiatal hernia. Dense stool noted throughout the colon. No bowel dilatation in the visualized abdomen. Vascular/Lymphatic: Moderate aortoiliac atherosclerotic disease. Retroperitoneal adenopathy encasing the distal abdominal aorta. There is a 5.6 x 4.7 cm mass inferior to the right renal hilum contiguous with the right paraspinal/retroperitoneal mass. Overall similar or slightly increased since the prior CT. Other: None Musculoskeletal: Osteopenia with degenerative changes. No acute osseous pathology. IMPRESSION: 1. Interval removal of the previously seen right ureteral stent with  development of severe right hydronephrosis. 2. Retroperitoneal adenopathy similar or slightly progressed since the prior CT. 3. Mild left hydronephrosis similar to prior CT. 4. Aortic Atherosclerosis (ICD10-I70.0). Electronically Signed   By: Elgie Collard M.D.   On: 11/15/2022 15:36    Scheduled Meds:  amLODipine  5 mg Oral Daily   Chlorhexidine Gluconate Cloth  6 each Topical Daily   docusate sodium  200 mg Oral BID   enoxaparin (LOVENOX) injection  30 mg Subcutaneous Q24H   fesoterodine  4 mg Oral Daily   Continuous Infusions:  sodium chloride 100 mL/hr at 11/17/22 0811   cefTRIAXone (ROCEPHIN)  IV 2 g (11/17/22 2841)     LOS: 2 days   Hughie Closs, MD [facility]  11/17/2022, 8:56 AM   *Please note that this is a verbal dictation therefore any spelling or grammatical errors are due to the "Dragon Medical One" system interpretation.  Please page via Amion and do not message via secure chat for urgent patient care  matters. Secure chat can be used for non urgent patient care matters.  How to contact the [facility] Attending or Consulting provider 7A - 7P or covering provider during after hours 7P -7A, for this patient?  Check the care team in [facility] and look for a) attending/consulting [facility] provider listed and b) the [facility] team listed. Page or secure chat 7A-7P. Log into [email or web address] and use Section's universal password to access. If you do not have the password, please contact the hospital operator. Locate the [facility] provider you are looking for under [facility] and page to a number that you can be directly reached. If you still have difficulty reaching the provider, please page the [facility] (Director on Call) for the Hospitalists listed on amion for assistance.

## 2022-11-17 NOTE — Progress Notes (Signed)
Palliative Medicine Progress Note   Patient Name: Laura [name]       Date: 11/17/2022 DOB: [birth year]  Age: 77 y.o. MRN#: [id number] Attending Physician: Hughie Closs, MD Primary Care Physician: Corwin Levins, MD Admit Date: 11/15/2022   HPI/Patient Profile: 77 y.o. female  with past medical history of metastatic ovarian cancer, type 2 diabetes, GERD, and IBS who presented to the ED on 11/15/2022 with intractable pain. Urinalysis was concerning for infection.  CT abdomen showed severe right hydronephrosis and removal of previously placed ureteral stent. She was admitted with cancer-related pain secondary to widely metastatic ovarian cancer as well as obstructive uropathy with resultant AKI.    Palliative medicine was consulted for "pain control".   Subjective: Chart reviewed. Patient underwent placement of right nephrostomy tube earlier today.  Bedside visit.  RN present in room and expresses concern regarding patient's vital signs (increased HR and RR, febrile). She also reports patient was having significant pain, which was finally relieved with IV dilaudid. Patient currently appears comfortable, but is somnolent from the medication.   Patient's daughter Laura [name] is present at bedside. I introduced myself and the role of Palliative Medicine. I shared with Laura [name] the conversation I had with her mother yesterday.  Discussed that patient has known widely metastatic ovarian cancer, and that her pain is due to disease progression.  Discussed that oncology has stated there are no additional treatment options.  I also shared with Laura [name] my recommendation to consider home hospice to provide ongoing support and pain management in the setting of terminal cancer.  Daughter is understandable emotional and  tearful. She plans to discuss with other family members.  Discussed tentative plan for family meeting tomorrow.   Objective:  Physical Exam Vitals reviewed.  Constitutional:      Appearance: She is ill-appearing.     Comments: Somnolent  Pulmonary:     Effort: Tachypnea present. No respiratory distress.             Vital Signs: BP 130/67 (BP Location: Right Arm)   Pulse (!) 114   Temp (!) 101.8 F (38.8 C) (Axillary)   Resp 20   Ht 5\' 3"  (1.6 m)   Wt 82.1 kg   SpO2 98%   BMI 32.06 kg/m  SpO2: SpO2: 98 %    Palliative Medicine Assessment &  Plan   Assessment: Principal Problem:   Cancer-related breakthrough pain    Recommendations/Plan: Continue current scope of care Agree with dilaudid 2 mg IV every 4 hours as needed for pain Tentative plan for family meeting tomorrow, time TBD   Code Status: DNR/DNI  Prognosis:  < 6 months would not be surprising  Discharge Planning: To Be Determined   Thank you for allowing the Palliative Medicine Team to assist in the care of this patient.   Time: 50 minutes  Detailed review of medical records (labs, imaging, vital signs), medically appropriate exam, discussed with treatment team, counseling and education to patient, family, & staff, documenting clinical information, medication management, coordination of care.    Merry Proud, NP   Please contact Palliative Medicine Team phone at [phone] for questions and concerns.  For individual providers, please see AMION.

## 2022-11-17 NOTE — Plan of Care (Signed)
  Problem: Education: Goal: Knowledge of General Education information will improve Description: Including pain rating scale, medication(s)/side effects and non-pharmacologic comfort measures Outcome: Progressing   Problem: Health Behavior/Discharge Planning: Goal: Ability to manage health-related needs will improve Outcome: Progressing   Problem: Clinical Measurements: Goal: Ability to maintain clinical measurements within normal limits will improve Outcome: Progressing Goal: Will remain free from infection Outcome: Progressing Goal: Diagnostic test results will improve Outcome: Progressing Goal: Respiratory complications will improve Outcome: Progressing Goal: Cardiovascular complication will be avoided Outcome: Progressing   Problem: Elimination: Goal: Will not experience complications related to bowel motility Outcome: Progressing Goal: Will not experience complications related to urinary retention Outcome: Progressing   Problem: Safety: Goal: Ability to remain free from injury will improve Outcome: Progressing   Problem: Skin Integrity: Goal: Risk for impaired skin integrity will decrease Outcome: Progressing

## 2022-11-17 NOTE — Plan of Care (Signed)
First time meeting patient.  She was in moderate discomfort on my arrival.  Hoping to have percutaneous nephrostomy tube placed on right side today.  Serum creatinine is stable to slightly improved.  I will check on her again tomorrow.  No acute events overnight.

## 2022-11-17 NOTE — Progress Notes (Signed)
Pt seemed very confused after nephrostomy placement. Fentanyl really seems to confuse pt and give pt hallucinations after taking care of her for two days. Pt stated "please get away, you will get hurt, and please quit trying to kill me".   Pt is in a lot of pain.  VS went from yellow to red mews.  101.6 axillary temp. 182/71 BP, HR 125.  Notified MD.  Executed orders.  Administered Dilaudid 2mg  IV via port.  Pt now resting and BP down to 130/67, HR 114.  Notified MD of VS.  Will continue to monitor.

## 2022-11-17 NOTE — Plan of Care (Signed)
  Problem: Safety: Goal: Ability to remain free from injury will improve Outcome: Progressing   Problem: Education: Goal: Knowledge of General Education information will improve Description: Including pain rating scale, medication(s)/side effects and non-pharmacologic comfort measures Outcome: Not Progressing   Problem: Health Behavior/Discharge Planning: Goal: Ability to manage health-related needs will improve Outcome: Not Progressing   Problem: Activity: Goal: Risk for activity intolerance will decrease Outcome: Not Progressing   Problem: Nutrition: Goal: Adequate nutrition will be maintained Outcome: Not Progressing   Problem: Pain Management: Goal: General experience of comfort will improve Outcome: Not Progressing   Problem: Skin Integrity: Goal: Risk for impaired skin integrity will decrease Outcome: Not Progressing

## 2022-11-18 DIAGNOSIS — Z515 Encounter for palliative care: Secondary | ICD-10-CM | POA: Diagnosis not present

## 2022-11-18 DIAGNOSIS — G893 Neoplasm related pain (acute) (chronic): Secondary | ICD-10-CM | POA: Diagnosis not present

## 2022-11-18 DIAGNOSIS — C799 Secondary malignant neoplasm of unspecified site: Secondary | ICD-10-CM | POA: Diagnosis not present

## 2022-11-18 DIAGNOSIS — N1339 Other hydronephrosis: Secondary | ICD-10-CM | POA: Diagnosis not present

## 2022-11-18 LAB — CBC WITH DIFFERENTIAL/PLATELET
Abs Immature Granulocytes: 0.33 10*3/uL — ABNORMAL HIGH (ref 0.00–0.07)
Basophils Absolute: 0 10*3/uL (ref 0.0–0.1)
Basophils Relative: 0 %
Eosinophils Absolute: 0.1 10*3/uL (ref 0.0–0.5)
Eosinophils Relative: 1 %
HCT: 29 % — ABNORMAL LOW (ref 36.0–46.0)
Hemoglobin: 9.6 g/dL — ABNORMAL LOW (ref 12.0–15.0)
Immature Granulocytes: 2 %
Lymphocytes Relative: 7 %
Lymphs Abs: 1 10*3/uL (ref 0.7–4.0)
MCH: 31.1 pg (ref 26.0–34.0)
MCHC: 33.1 g/dL (ref 30.0–36.0)
MCV: 93.9 fL (ref 80.0–100.0)
Monocytes Absolute: 0.9 10*3/uL (ref 0.1–1.0)
Monocytes Relative: 7 %
Neutro Abs: 11.2 10*3/uL — ABNORMAL HIGH (ref 1.7–7.7)
Neutrophils Relative %: 83 %
Platelets: 221 10*3/uL (ref 150–400)
RBC: 3.09 MIL/uL — ABNORMAL LOW (ref 3.87–5.11)
RDW: 16.7 % — ABNORMAL HIGH (ref 11.5–15.5)
WBC: 13.6 10*3/uL — ABNORMAL HIGH (ref 4.0–10.5)
nRBC: 0 % (ref 0.0–0.2)

## 2022-11-18 LAB — BASIC METABOLIC PANEL
Anion gap: 11 (ref 5–15)
BUN: 19 mg/dL (ref 8–23)
CO2: 18 mmol/L — ABNORMAL LOW (ref 22–32)
Calcium: 8.8 mg/dL — ABNORMAL LOW (ref 8.9–10.3)
Chloride: 104 mmol/L (ref 98–111)
Creatinine, Ser: 1.63 mg/dL — ABNORMAL HIGH (ref 0.44–1.00)
GFR, Estimated: 32 mL/min — ABNORMAL LOW (ref >60)
Glucose, Bld: 101 mg/dL — ABNORMAL HIGH (ref 70–99)
Potassium: 3.7 mmol/L (ref 3.5–5.1)
Sodium: 133 mmol/L — ABNORMAL LOW (ref 135–145)

## 2022-11-18 LAB — URINE CULTURE: Culture: 50000 — AB

## 2022-11-18 MED ORDER — TIZANIDINE HCL 4 MG PO TABS
2.0000 mg | ORAL_TABLET | Freq: Once | ORAL | Status: AC
  Administered 2022-11-18: 2 mg via ORAL
  Filled 2022-11-18: qty 1

## 2022-11-18 MED ORDER — HYDROMORPHONE HCL 4 MG PO TABS
4.0000 mg | ORAL_TABLET | ORAL | Status: DC | PRN
  Administered 2022-11-18 – 2022-11-19 (×6): 4 mg via ORAL
  Filled 2022-11-18 (×6): qty 1

## 2022-11-18 NOTE — Evaluation (Signed)
Physical Therapy Evaluation Patient Details Name: Laura [name] MRN: [id number] DOB: [birth year] Today's Date: 11/18/2022  History of Present Illness  Patient is a 77 year old female who presented to the ED on 11/2 with intractable pain. Patient was admitted with right hydronephrosis secondary to malignant obstruction. Patient underwent right PCN placement on 11/4. PMH: ovarian cancer, DM II, GERD, IBS  Clinical Impression  Pt admitted with above diagnosis.  Pt currently with functional limitations due to the deficits listed below (see PT Problem List). Pt will benefit from acute skilled PT to increase their independence and safety with mobility to allow discharge.     The patient reporting right  side pain and presents with  a heaving  at times.  Patient  has ben home with family members assisting. Unsure  if there is  24/7 coverage, which patient will require.   HT noted to increase to 130's with activity. RN aware. Patient did  transfer  from bed to [facility] to recliner with min assistance.   Patient will benefit from continued inpatient follow up therapy, <3 hours/day if does not have support.       If plan is discharge home, recommend the following: Two people to help with walking and/or transfers;A little help with bathing/dressing/bathroom;Assistance with cooking/housework;Assist for transportation   Can travel by private vehicle   Yes    Equipment Recommendations None recommended by PT  Recommendations for Other Services       Functional Status Assessment Patient has had a recent decline in their functional status and demonstrates the ability to make significant improvements in function in a reasonable and predictable amount of time.     Precautions / Restrictions Precautions Precautions: Fall Restrictions Weight Bearing Restrictions: No      Mobility  Bed Mobility                    Transfers Overall transfer level: Needs assistance Equipment used: Rolling  walker (2 wheels) Transfers: Sit to/from Stand, Bed to chair/wheelchair/BSC Sit to Stand: Min assist           General transfer comment: cues for safety, able to stnad and reach and trun to [facility], then use Of RW to stand and step to recliner. Patient reporting spasms in the right  side.    Ambulation/Gait                  Stairs            Wheelchair Mobility     Tilt Bed    Modified Rankin (Stroke Patients Only)       Balance Overall balance assessment: Mild deficits observed, not formally tested                                           Pertinent Vitals/Pain Pain Assessment Pain Assessment: Faces Faces Pain Scale: Hurts even more Pain Location: pain in back near drain Pain Descriptors / Indicators: Discomfort, Grimacing, Spasm, Stabbing Pain Intervention(s): Monitored during session    Home Living Family/patient expects to be discharged to:: Private residence Living Arrangements: Alone Available Help at Discharge: Family;Available PRN/intermittently Type of Home: House Home Access: Stairs to enter Entrance Stairs-Rails: Right;Left;Can reach both Entrance Stairs-Number of Steps: 4   Home Layout: One level Home Equipment: Agricultural consultant (2 wheels);Shower seat      Prior Function Prior Level of Function :  Independent/Modified Independent             Mobility Comments: using RW, reports family has been stayin =g and asissting       Extremity/Trunk Assessment   Upper Extremity Assessment Upper Extremity Assessment: Overall WFL for tasks assessed    Lower Extremity Assessment Lower Extremity Assessment: Generalized weakness    Cervical / Trunk Assessment Cervical / Trunk Assessment: Normal  Communication   Communication Communication: No apparent difficulties  Cognition Arousal: Alert Behavior During Therapy: WFL for tasks assessed/performed                                   General Comments:  patient was plesant and cooperative during session.        General Comments General comments (skin integrity, edema, etc.): patient was noted to have excessive jaw movements laterally with speech.    Exercises     Assessment/Plan    PT Assessment Patient needs continued PT services  PT Problem List Decreased strength;Pain;Decreased knowledge of use of DME;Decreased activity tolerance;Decreased mobility;Decreased knowledge of precautions       PT Treatment Interventions DME instruction;Therapeutic exercise;Gait training;Functional mobility training;Therapeutic activities;Patient/family education    PT Goals (Current goals can be found in the Care Plan section)  Acute Rehab PT Goals Patient Stated Goal: go home PT Goal Formulation: With patient/family Time For Goal Achievement: 12/02/22 Potential to Achieve Goals: Fair    Frequency Min 1X/week     Co-evaluation PT/OT/SLP Co-Evaluation/Treatment: Yes Reason for Co-Treatment: For patient/therapist safety;To address functional/ADL transfers PT goals addressed during session: Mobility/safety with mobility OT goals addressed during session: ADL's and self-care       AM-PAC PT "6 Clicks" Mobility  Outcome Measure Help needed turning from your back to your side while in a flat bed without using bedrails?: A Little Help needed moving from lying on your back to sitting on the side of a flat bed without using bedrails?: A Little Help needed moving to and from a bed to a chair (including a wheelchair)?: A Little Help needed standing up from a chair using your arms (e.g., wheelchair or bedside chair)?: A Little Help needed to walk in hospital room?: A Lot Help needed climbing 3-5 steps with a railing? : Total 6 Click Score: 15    End of Session Equipment Utilized During Treatment: Gait belt Activity Tolerance: Patient limited by pain Patient left: in chair;with chair alarm set;with call bell/phone within reach;with family/visitor  present Nurse Communication: Mobility status PT Visit Diagnosis: Unsteadiness on feet (R26.81)    Time: 1610-9604 PT Time Calculation (min) (ACUTE ONLY): 28 min   Charges:   PT Evaluation $PT Eval Low Complexity: 1 Low   PT General Charges $$ ACUTE PT VISIT: 1 Visit         Blanchard Kelch PT Acute Rehabilitation Services Office [phone] Weekend pager-[phone]   Rada Hay 11/18/2022, 2:06 PM

## 2022-11-18 NOTE — Progress Notes (Signed)
Subjective: Sleeping on arrival. Says her flank pain is much better. Clear yellow urine in PCNT with minimal blood strands/mucous.  Objective: Vital signs in last 24 hours: Temp:  [97.9 F (36.6 C)-101.8 F (38.8 C)] 99.8 F (37.7 C) (11/05 0527) Pulse Rate:  [93-125] 93 (11/05 0527) Resp:  [14-24] 14 (11/05 0527) BP: (112-193)/(62-117) 140/68 (11/05 0527) SpO2:  [93 %-100 %] 94 % (11/05 0527)  Assessment/Plan: #malignant obstruction S/p R. PCNT. Purulent drainage on placement, culture pending.  Daily Ceftriaxone per primary. CBC not available today, though no chills or fever reported.  Trend labs, interval improvement in Scr. Will follow peripherally  Intake/Output from previous day: 11/04 0701 - 11/05 0700 In: 10 [I.V.:5] Out: 900 [Urine:900]  Intake/Output this shift: No intake/output data recorded.  Physical Exam:  General: Alert and oriented CV: No cyanosis Lungs: equal chest rise Abdomen: Soft, NTND, no rebound or guarding Skin: Gu: R. PCNT in place. Clear yellow urine. Minimal mucous/blood stranding.   Lab Results: Recent Labs    11/15/22 1234 11/16/22 0426  HGB 10.3* 9.0*  HCT 30.4* 27.4*   BMET Recent Labs    11/17/22 0606 11/18/22 0030  NA 131* 133*  K 3.7 3.7  CL 100 104  CO2 19* 18*  GLUCOSE 109* 101*  BUN 21 19  CREATININE 1.75* 1.63*  CALCIUM 8.9 8.8*     Studies/Results: IR NEPHROSTOMY PLACEMENT RIGHT  Result Date: 11/17/2022 CLINICAL DATA:  Right renal obstruction with clinical evidence of infection and need for percutaneous nephrostomy tube placement. EXAM: 1. ULTRASOUND GUIDANCE FOR PUNCTURE OF THE RIGHT RENAL COLLECTING SYSTEM. 2. RIGHT PERCUTANEOUS NEPHROSTOMY TUBE PLACEMENT. COMPARISON:  None Available. ANESTHESIA/SEDATION: Moderate (conscious) sedation was employed during this procedure. A total of Versed 4.0 mg and Fentanyl 200 mcg was administered intravenously by radiology nursing. Moderate Sedation Time: 14 minutes. The  patient's level of consciousness and vital signs were monitored continuously by radiology nursing throughout the procedure under my direct supervision. CONTRAST:  15 mL Omnipaque 300 MEDICATIONS: None additional FLUOROSCOPY TIME:  26 seconds.  12.0 mGy. PROCEDURE: The procedure, risks, benefits, and alternatives were explained to the patient. Questions regarding the procedure were encouraged and answered. The patient understands and consents to the procedure. A time-out was performed prior to initiating the procedure. The right flank region was prepped with chlorhexidine in a sterile fashion, and a sterile drape was applied covering the operative field. A sterile gown and sterile gloves were used for the procedure. Local anesthesia was provided with 1% Lidocaine. Ultrasound was used to localize the right kidney. Under direct ultrasound guidance, an 18 gauge trocar needle was advanced into the renal collecting system. Ultrasound image documentation was performed. Aspiration of urine sample was performed followed by contrast injection. Aspirated urine sample was sent for culture analysis. Percutaneous tract dilatation was then performed over the guidewire. A 10-French percutaneous nephrostomy tube was then advanced and formed in the collecting system. Catheter position was confirmed by fluoroscopy after contrast injection. The catheter was secured at the skin with a Prolene retention suture and Stat-Lock device. A gravity bag was placed. COMPLICATIONS: None. FINDINGS: Ultrasound demonstrates severe hydronephrosis of the right kidney. After access of the upper pole collecting system, aspiration yielded grossly purulent fluid consistent with pyonephrosis. A 20 mL sample of purulent fluid was sent for culture analysis. The nephrostomy tube was formed at the level of the renal pelvis. There is return of purulent fluid with gravity bag drainage. IMPRESSION: High-grade obstruction of the right kidney  with return of grossly  purulent fluid consistent with pyonephrosis. A fluid sample was sent for culture analysis. A 10 French nephrostomy tube was placed via upper pole access and was placed to gravity bag drainage. Electronically Signed   By: Irish Lack M.D.   On: 11/17/2022 11:24      LOS: 3 days   Elmon Kirschner, NP [facility] Pager: [phone]  11/18/2022, 8:05 AM

## 2022-11-18 NOTE — Evaluation (Signed)
Occupational Therapy Evaluation Patient Details Name: Laura [name] MRN: [id number] DOB: 1945-11-29 Today's Date: 11/18/2022   History of Present Illness Patient is a 77 year old female who presented to the ED on 11/2 with intractable pain. Patient was admitted with right hydronephrosis secondary to malignant obstruction. Patient underwent right PCN placement on 11/4. PMH: ovarian cancer, DM II, GERD, IBS   Clinical Impression   Patient is a 77 year old female who was admitted for above. Patient was living at home with PRN support from family. Patient's HR noted to be 130s during toileting tasks. Patient was noted to have decreased functional activity tolerance, decreased endurance, decreased standing balance, decreased safety awareness, and decreased knowledge of AD/AE impacting participation in ADLs. Patient will need 24/7 caregiver support in next level of care. Patient reported family was working on developing caregivers around the clock at home. Patient will benefit from continued inpatient follow up therapy, <3 hours/day       If plan is discharge home, recommend the following: Two people to help with walking and/or transfers;A lot of help with bathing/dressing/bathroom;Assistance with cooking/housework;Assist for transportation;Direct supervision/assist for medications management;Direct supervision/assist for financial management;Help with stairs or ramp for entrance    Functional Status Assessment  Patient has had a recent decline in their functional status and demonstrates the ability to make significant improvements in function in a reasonable and predictable amount of time.  Equipment Recommendations  None recommended by OT       Precautions / Restrictions Precautions Precautions: Fall Restrictions Weight Bearing Restrictions: No      Mobility Bed Mobility Overal bed mobility: Needs Assistance Bed Mobility: Supine to Sit     Supine to sit: Min assist, Used rails      General bed mobility comments: with increased time              Balance Overall balance assessment: Mild deficits observed, not formally tested       ADL either performed or assessed with clinical judgement   ADL Overall ADL's : Needs assistance/impaired Eating/Feeding: Set up;Sitting   Grooming: Sitting;Minimal assistance   Upper Body Bathing: Sitting;Minimal assistance   Lower Body Bathing: Sitting/lateral leans;Maximal assistance   Upper Body Dressing : Sitting;Minimal assistance   Lower Body Dressing: Sitting/lateral leans;Maximal assistance   Toilet Transfer: +2 for physical assistance;+2 for safety/equipment;Minimal assistance;Stand-pivot;Rolling walker (2 wheels) Toilet Transfer Details (indicate cue type and reason): to [facility] and then to recliner in room. HR p to 130s with movement. nurse aware. Toileting- Clothing Manipulation and Hygiene: Minimal assistance;Sit to/from stand Toileting - Clothing Manipulation Details (indicate cue type and reason): with one hand support on RW.             Vision   Vision Assessment?: No apparent visual deficits            Pertinent Vitals/Pain Pain Assessment Pain Assessment: 0-10 Pain Score: 4  Pain Location: pain in back near drain Pain Descriptors / Indicators: Discomfort, Grimacing Pain Intervention(s): Limited activity within patient's tolerance, Monitored during session, Repositioned     Extremity/Trunk Assessment Upper Extremity Assessment Upper Extremity Assessment: Right hand dominant;Overall [facility] for tasks assessed   Lower Extremity Assessment Lower Extremity Assessment: Defer to PT evaluation          Cognition Arousal: Alert Behavior During Therapy: [facility] for tasks assessed/performed Overall Cognitive Status: Within Functional Limits for tasks assessed       General Comments: patient was plesant and cooperative during session.     General  Comments  patient was noted to have excessive jaw  movements laterally with speech.            Home Living Family/patient expects to be discharged to:: Private residence Living Arrangements: Alone Available Help at Discharge: Family;Available PRN/intermittently Type of Home: House Home Access: Stairs to enter [employer] of Steps: 4 Entrance Stairs-Rails: Right;Left;Can reach both Home Layout: One level     Bathroom Shower/Tub: Walk-in shower         Home Equipment: Agricultural consultant (2 wheels);Shower seat          Prior Functioning/Environment Prior Level of Function : Independent/Modified Independent                        OT Problem List: Decreased activity tolerance;Impaired balance (sitting and/or standing);Decreased coordination;Decreased safety awareness;Decreased knowledge of precautions;Decreased knowledge of use of DME or AE      OT Treatment/Interventions: Self-care/ADL training;Therapeutic exercise;DME and/or AE instruction;Patient/family education;Therapeutic activities;Balance training    OT Goals(Current goals can be found in the care plan section) Acute Rehab OT Goals Patient Stated Goal: to go home OT Goal Formulation: With patient Time For Goal Achievement: 12/02/22 Potential to Achieve Goals: Fair  OT Frequency: Min 1X/week    Co-evaluation PT/OT/SLP Co-Evaluation/Treatment: Yes Reason for Co-Treatment: For patient/therapist safety;To address functional/ADL transfers PT goals addressed during session: Mobility/safety with mobility OT goals addressed during session: ADL's and self-care      AM-PAC OT "6 Clicks" Daily Activity     Outcome Measure Help from another person eating meals?: A Little Help from another person taking care of personal grooming?: A Little Help from another person toileting, which includes using toliet, bedpan, or urinal?: A Lot Help from another person bathing (including washing, rinsing, drying)?: A Lot Help from another person to put on and taking off  regular upper body clothing?: A Little Help from another person to put on and taking off regular lower body clothing?: A Lot 6 Click Score: 15   End of Session Equipment Utilized During Treatment: Gait belt;Rolling walker (2 wheels);Other (comment) [facility]) Nurse Communication: Mobility status  Activity Tolerance: Patient tolerated treatment well Patient left: in chair;with call bell/phone within reach;with chair alarm set  OT Visit Diagnosis: Unsteadiness on feet (R26.81);Other abnormalities of gait and mobility (R26.89);Muscle weakness (generalized) (M62.81);Pain                Time: 6433-2951 OT Time Calculation (min): 22 min Charges:  OT General Charges $OT Visit: 1 Visit OT Evaluation $OT Eval Moderate Complexity: 1 Mod  Rondall Radigan OTR/L, MS Acute Rehabilitation Department Office# [phone]   Selinda Flavin 11/18/2022, 1:00 PM

## 2022-11-18 NOTE — Progress Notes (Signed)
Referring Physician(s): Rene Kocher, MD  Supervising Physician: Ruel Favors  Patient Status:  [facility] - In-pt  Chief Complaint:  Right hydronephrosis s/p right percutaneous nephrostomy drain placement  Subjective:  Patient is alert with apparent confusion as she thinks she is at home. She appears to be in pain at time of exam, but family states that she intermittently gets panicked and appears to be in more pain when this occurs. Pain primarily located in the groin per the patient. Numerous family members at bedside with patient.  Allergies: Ciprofloxacin, Hydrocodone bit-homatrop mbr, Prednisone, Sulfa antibiotics, Alfuzosin, Codeine, Crestor [rosuvastatin calcium], Doxycycline, Gabapentin, Keflex [cephalexin], Myrbetriq [mirabegron er], Naproxen, and Statins  Medications: Prior to Admission medications   Medication Sig Start Date End Date Taking? Authorizing Provider  acetaminophen (TYLENOL) 500 MG tablet Take 1 tablet (500 mg total) by mouth every 6 (six) hours as needed. Patient taking differently: Take 500 mg by mouth every 6 (six) hours as needed for mild pain (pain score 1-3) or moderate pain (pain score 4-6). 05/16/20  Yes Kerrin Champagne, MD  amLODipine (NORVASC) 10 MG tablet Take 1 tablet (10 mg total) by mouth daily. Patient taking differently: Take 5 mg by mouth daily. 07/29/22  Yes Doreatha Massed, MD  bismuth subsalicylate (PEPTO BISMOL) 262 MG/15ML suspension Take 30 mLs by mouth every 6 (six) hours as needed for indigestion or diarrhea or loose stools.   Yes [provider]  docusate sodium (COLACE) 100 MG capsule Take 2 capsules (200 mg total) by mouth 2 (two) times daily. Patient taking differently: Take 100 mg by mouth as needed for moderate constipation. 07/01/22  Yes Rojelio Brenner M, PA-C  famotidine-calcium carbonate-magnesium hydroxide (PEPCID COMPLETE) 10-800-165 MG chewable tablet Chew 1 tablet by mouth daily.   Yes [provider]   furosemide (LASIX) 20 MG tablet TAKE 1 TABLET (20MG ) BY MOUTH DAILYAS NEEDED FOR EDEMA (SWELLING) Patient taking differently: Take 20 mg by mouth daily. 09/28/22  Yes Pennington, Rebekah M, PA-C  glimepiride (AMARYL) 2 MG tablet TAKE 1/2 TABLET (1MG ) BY MOUTH ONCE DAILY BEFORE BREAKFAST 08/21/22  Yes Corwin Levins, MD  HYDROmorphone (DILAUDID) 4 MG tablet Take 4 mg by mouth every 4 (four) hours as needed for severe pain (PRN as needed.).   Yes [provider]  lidocaine (LIDODERM) 5 % Place 1 patch onto the skin daily. Remove & Discard patch within 12 hours or as directed by MD Patient taking differently: Place 1-3 patches onto the skin daily as needed (pain). Remove & Discard patch within 12 hours or as directed by MD 09/01/22  Yes Doreatha Massed, MD  loperamide (IMODIUM A-D) 2 MG tablet Take 2-6 mg by mouth as needed for diarrhea or loose stools.   Yes [provider]  loratadine (CLARITIN) 10 MG tablet Take 10 mg by mouth daily.   Yes [provider]  meloxicam (MOBIC) 7.5 MG tablet Take 7.5 mg by mouth daily. 11/10/22  Yes [provider]  prochlorperazine (COMPAZINE) 10 MG tablet Take 1 tablet (10 mg total) by mouth every 6 (six) hours as needed for nausea or vomiting. 07/14/22  Yes Doreatha Massed, MD  solifenacin (VESICARE) 5 MG tablet Take 1 tablet (5 mg total) by mouth daily. 10/13/22  Yes   traMADol (ULTRAM) 50 MG tablet Take 1 every 6 hours for pain not relieved by Tylenol alone Patient taking differently: Take 50 mg by mouth every 6 (six) hours as needed for moderate pain (pain score 4-6) or severe pain (  pain score 7-10). 11/12/22  Yes Bethann Berkshire, MD  traZODone (DESYREL) 100 MG tablet 1/2 - 1 tab by mouth at bedtime for sleep as needed Patient taking differently: Take 50 mg by mouth at bedtime as needed for sleep. 08/26/22  Yes Corwin Levins, MD  Blood Glucose Monitoring Suppl (ONE TOUCH ULTRA 2) w/Device KIT Use as directed 04/06/15   Corwin Levins, MD  glucose blood [facility] ULTRA) test strip USE TO CHECK BLOOD SUGARS TWO TIMES DAILY 03/22/21   Corwin Levins, MD  Lancets [facility] ULTRASOFT) lancets 1 each by Other route as needed for other. Use as instructed 03/20/21   Corwin Levins, MD  megestrol (MEGACE) 40 MG/ML suspension Take by mouth daily. Patient not taking: Reported on 11/15/2022    [provider]     Vital Signs: BP (!) 145/68 (BP Location: Right Arm)   Pulse (!) 101   Temp 98.2 F (36.8 C) (Oral)   Resp 18   Ht 5\' 3"  (1.6 m)   Wt 181 lb (82.1 kg)   SpO2 99%   BMI 32.06 kg/m   Physical Exam Vitals reviewed.  Pulmonary:     Effort: Pulmonary effort is normal.  Genitourinary:    Comments: Right PCN in place. Insertion site unremarkable without bleeding, erythema, induration, or other drainage. Dressed appropriately with dressing clean, dry, and intact. Approximately 100 mL of blood tinged light yellow urine in PCN bag at time of exam Skin:    General: Skin is warm and dry.  Neurological:     Mental Status: She is alert. She is disoriented.     Imaging: IR NEPHROSTOMY PLACEMENT RIGHT  Result Date: 11/17/2022 CLINICAL DATA:  Right renal obstruction with clinical evidence of infection and need for percutaneous nephrostomy tube placement. EXAM: 1. ULTRASOUND GUIDANCE FOR PUNCTURE OF THE RIGHT RENAL COLLECTING SYSTEM. 2. RIGHT PERCUTANEOUS NEPHROSTOMY TUBE PLACEMENT. COMPARISON:  None Available. ANESTHESIA/SEDATION: Moderate (conscious) sedation was employed during this procedure. A total of Versed 4.0 mg and Fentanyl 200 mcg was administered intravenously by radiology nursing. Moderate Sedation Time: 14 minutes. The patient's level of consciousness and vital signs were monitored continuously by radiology nursing throughout the procedure under my direct supervision. CONTRAST:  15 mL Omnipaque 300 MEDICATIONS: None additional FLUOROSCOPY TIME:  26 seconds.  12.0 mGy. PROCEDURE: The procedure, risks,  benefits, and alternatives were explained to the patient. Questions regarding the procedure were encouraged and answered. The patient understands and consents to the procedure. A time-out was performed prior to initiating the procedure. The right flank region was prepped with chlorhexidine in a sterile fashion, and a sterile drape was applied covering the operative field. A sterile gown and sterile gloves were used for the procedure. Local anesthesia was provided with 1% Lidocaine. Ultrasound was used to localize the right kidney. Under direct ultrasound guidance, an 18 gauge trocar needle was advanced into the renal collecting system. Ultrasound image documentation was performed. Aspiration of urine sample was performed followed by contrast injection. Aspirated urine sample was sent for culture analysis. Percutaneous tract dilatation was then performed over the guidewire. A 10-French percutaneous nephrostomy tube was then advanced and formed in the collecting system. Catheter position was confirmed by fluoroscopy after contrast injection. The catheter was secured at the skin with a Prolene retention suture and Stat-Lock device. A gravity bag was placed. COMPLICATIONS: None. FINDINGS: Ultrasound demonstrates severe hydronephrosis of the right kidney. After access of the upper pole collecting system, aspiration yielded grossly purulent fluid consistent with  pyonephrosis. A 20 mL sample of purulent fluid was sent for culture analysis. The nephrostomy tube was formed at the level of the renal pelvis. There is return of purulent fluid with gravity bag drainage. IMPRESSION: High-grade obstruction of the right kidney with return of grossly purulent fluid consistent with pyonephrosis. A fluid sample was sent for culture analysis. A 10 French nephrostomy tube was placed via upper pole access and was placed to gravity bag drainage. Electronically Signed   By: Irish Lack M.D.   On: 11/17/2022 11:24   CT ABDOMEN WO  CONTRAST  Result Date: 11/15/2022 CLINICAL DATA:  Flank pain. History of ovarian cancer and chemotherapy. EXAM: CT ABDOMEN WITHOUT CONTRAST TECHNIQUE: Multidetector CT imaging of the abdomen was performed following the standard protocol without IV contrast. RADIATION DOSE REDUCTION: This exam was performed according to the departmental dose-optimization program which includes automated exposure control, adjustment of the mA and/or kV according to patient size and/or use of iterative reconstruction technique. COMPARISON:  CT abdomen pelvis dated 08/25/2022. FINDINGS: Evaluation of this exam is limited in the absence of intravenous contrast. Lower chest: There are bibasilar linear atelectasis/scarring. Trace bilateral pleural effusions suspected. There is 3 vessel coronary vascular calcification. No intra-abdominal free air. Hepatobiliary: The liver is unremarkable. No biliary ductal dilatation. Cholecystectomy. No retained calcified stone noted in the central CBD. Pancreas: Unremarkable. No pancreatic ductal dilatation or surrounding inflammatory changes. Spleen: Normal in size without focal abnormality. Adrenals/Urinary Tract: The adrenal glands are unremarkable. There is mild left hydronephrosis similar to prior CT. No stone identified in the left kidney or the visualized left ureter. There is severe right hydronephrosis, significantly progressed since the prior CT. Interval removal of the previously seen right ureteral stent. No stone identified. Stomach/Bowel: There is a small hiatal hernia. Dense stool noted throughout the colon. No bowel dilatation in the visualized abdomen. Vascular/Lymphatic: Moderate aortoiliac atherosclerotic disease. Retroperitoneal adenopathy encasing the distal abdominal aorta. There is a 5.6 x 4.7 cm mass inferior to the right renal hilum contiguous with the right paraspinal/retroperitoneal mass. Overall similar or slightly increased since the prior CT. Other: None Musculoskeletal:  Osteopenia with degenerative changes. No acute osseous pathology. IMPRESSION: 1. Interval removal of the previously seen right ureteral stent with development of severe right hydronephrosis. 2. Retroperitoneal adenopathy similar or slightly progressed since the prior CT. 3. Mild left hydronephrosis similar to prior CT. 4. Aortic Atherosclerosis (ICD10-I70.0). Electronically Signed   By: Elgie Collard M.D.   On: 11/15/2022 15:36    Labs:  CBC: Recent Labs    10/21/22 1130 10/30/22 1120 11/15/22 1234 11/16/22 0426  WBC 7.6 7.2 12.3* 10.0  HGB 13.1 11.6* 10.3* 9.0*  HCT 39.6 34.2* 30.4* 27.4*  PLT 206 189 206 180    COAGS: Recent Labs    11/16/22 1207  INR 1.2    BMP: Recent Labs    11/15/22 1234 11/16/22 0426 11/17/22 0606 11/18/22 0030  NA 127* 131* 131* 133*  K 3.8 3.5 3.7 3.7  CL 98 101 100 104  CO2 20* 21* 19* 18*  GLUCOSE 165* 87 109* 101*  BUN 38* 29* 21 19  CALCIUM 9.1 8.7* 8.9 8.8*  CREATININE 1.91* 1.84* 1.75* 1.63*  GFRNONAA 27* 28* 29* 32*    LIVER FUNCTION TESTS: Recent Labs    09/09/22 1954 09/10/22 0823 09/14/22 1647 10/30/22 1120  BILITOT 0.3 0.6 0.6 0.5  AST 32 30 30 21   ALT 9 13 14 14   ALKPHOS 68 68 67 54  PROT 7.9 7.4  7.3 7.0  ALBUMIN 4.3 3.8 3.8 3.7    Assessment and Plan:  Right hydronephrosis s/p R PCN placement 11/17/22 -Patient tolerating right PCN, no concern for bleeding or infection at drain site at time of exam -Rare Gram negative rods seen on Gram stain, culture results pending -Serum creatinine slightly improved from 1.75 to 1.63 -775 mL of output documented from PCN since placement, ~100 mL of output in bag at time of exam.  Drain Location: CVA right Size: Fr size: 10 Fr Date of placement: 11/17/22  Currently to: Drain collection device: gravity  Interval imaging/drain manipulation:  None  Current examination: Flushes/aspirates easily.  Insertion site unremarkable. Suture and stat lock in place. Dressed  appropriately.   Plan: Continue TID flushes with 5 cc NS. Record output Q shift. Dressing changes QD or PRN if soiled.  Call IR APP or on call IR MD if difficulty flushing or sudden change in drain output.    Discharge planning: Please contact IR APP or on call IR MD prior to patient d/c to ensure appropriate follow up plans are in place. Typically patient will follow up with IR 6-8 weeks post d/c for routine nephrostomy drain exchange.  IR scheduler will contact patient with date/time of appointment. Patient will need perform dressing changes every 2-3 days or earlier if soiled.   IR will continue to follow - please call with questions or concerns.     Electronically Signed: Kennieth Francois, PA-C 11/18/2022, 2:06 PM   I spent a total of 15 Minutes at the the patient's bedside AND on the patient's hospital floor or unit, greater than 50% of which was counseling/coordinating care for right hydronephrosis.

## 2022-11-18 NOTE — Progress Notes (Signed)
PROGRESS NOTE    Laura [name]  ZOX:[id number] DOB: 1945/08/12 DOA: 11/15/2022 PCP: Corwin Levins, MD   Brief Narrative:  HPI: Laura [name] is a 77 y.o. female with medical history significant of ovarian cancer, type 2 diabetes, GERD, IBS who presents emergency department due to intractable pain pain.  Patient was seen by primary care on 10/31.  She was endorsing refractory pain to her home Dilaudid and was given a Toradol injection.  Patient presented to the ER due to refractory symptoms.  Patient states the last 3 days she has been having refractory back pain located over her right flank and right hip with radiation down her legs.  She has had numerous prior presentation to the ER for similar complaints and is taking chronic pain medications at home.  Regarding her cancer she has widespread disease with peritoneal metastasis.  She last saw her oncologist on 10/17.  She is undergoing chemotherapy and radiation does been complicated by right hydronephrosis requiring stent placement.   In the ER lab were obtained which showed sodium 127, creatinine 1.9 baseline 1.3, WBC 12.3, hemoglobin 10.3, platelets 206, urinalysis concerning for infection.  Patient underwent CT abdomen which showed removal of previous placed ureteral stent with severe right hydronephrosis.  Urology was consulted in the emergency department and patient was admitted for further workup.  On admission she was given ceftriaxone for UTI.     Assessment & Plan:   Principal Problem:   Cancer-related breakthrough pain  AKI on CKD stage IIIa/right hydronephrosis/UTI: UA consistent with UTI.  Patient has history of right hydronephrosis, previously had ureteral stent, urology tried to replace but this as outpatient was unsuccessful, she opted to give herself trial of without stent, comes in with right-sided flank pain, secondary to right hydronephrosis.  Has been started on Rocephin, follow culture.  Seen by urology, IR consulted, patient  underwent percutaneous nephrostomy placed on the afternoon of 11/17/2022 by IR.  She developed fever postprocedure and reportedly she had purulent drainage upon placement, culture pending.  Last temperature spike was around 5:30 PM 11/17/2022.  I have ordered blood cultures.  Continue current Rocephin.  Sepsis secondary to UTI, not POA: Patient developed fever with Tmax of 101.8 along with tachycardia postprocedure/percutaneous nephrostomy on the afternoon of 11/17/2022 meeting criteria for sepsis.  Management as above.  CBC pending.  History of metastatic ovarian carcinoma with cancer related pain: Pain very well-controlled on Dilaudid.  This was switched to IV since she could not tolerate p.o. yesterday.  Now she is feeling better so I will transition back to her home dose of p.o. Dilaudid.  GERD: Continue Pepcid.  Hypertension: Blood pressure fairly controlled.  Continue home amlodipine.  Prediabetes: Hemoglobin A1c 5.9 just 3 weeks ago, she is on diet control.  Blood sugar very well-controlled.  Does not need SSI.  Obstructive sleep apnea: Patient does not use CPAP.  Insomnia: Continue trazodone.  DVT prophylaxis: enoxaparin (LOVENOX) injection 30 mg Start: 11/15/22 2000 SCDs Start: 11/15/22 1636   Code Status: Limited: Do not attempt resuscitation (DNR) -DNR-LIMITED -Do Not Intubate/DNI   Family Communication:  None present at bedside.  Plan of care discussed with patient in length and he/she verbalized understanding and agreed with it.  Status is: Inpatient Remains inpatient appropriate because: Needs nephrostomy tube.   Estimated body mass index is 32.06 kg/m as calculated from the following:   Height as of this encounter: 5\' 3"  (1.6 m).   Weight as of this encounter: 82.1 kg.  Nutritional Assessment: Body mass index is 32.06 kg/m.Marland Kitchen Seen by dietician.  I agree with the assessment and plan as outlined below: Nutrition Status:   Skin Assessment: I have examined the patient's  skin and I agree with the wound assessment as performed by the wound care RN as outlined below:  Consultants:  Urology  Procedures:  None  Antimicrobials:  Anti-infectives (From admission, onward)    Start     Dose/Rate Route Frequency Ordered Stop   11/16/22 1000  cefTRIAXone (ROCEPHIN) 2 g in sodium chloride 0.9 % 100 mL IVPB        2 g 200 mL/hr over 30 Minutes Intravenous Every 24 hours 11/15/22 1658     11/15/22 1515  cefTRIAXone (ROCEPHIN) 1 g in sodium chloride 0.9 % 100 mL IVPB        1 g 200 mL/hr over 30 Minutes Intravenous  Once 11/15/22 1506 11/15/22 1638         Subjective: Seen and examined.  Multiple family members at the bedside.  Patient states that she is feeling much better today with no pain.  No other complaint.  Objective: Vitals:   11/17/22 1747 11/17/22 2133 11/18/22 0527 11/18/22 0947  BP: 112/68 113/62 (!) 140/68 (!) 145/68  Pulse: (!) 104 (!) 103 93 (!) 101  Resp:  18 14 18   Temp: (!) 100.9 F (38.3 C) 98.6 F (37 C) 99.8 F (37.7 C) 98.2 F (36.8 C)  TempSrc: Oral Oral Oral Oral  SpO2:  93% 94% 99%  Weight:      Height:        Intake/Output Summary (Last 24 hours) at 11/18/2022 1120 Last data filed at 11/18/2022 0857 Gross per 24 hour  Intake 30 ml  Output 775 ml  Net -745 ml   Filed Weights   11/15/22 1111 11/15/22 1753  Weight: 82.1 kg 82.1 kg    Examination:  General exam: Appears calm and comfortable  Respiratory system: Clear to auscultation. Respiratory effort normal. Cardiovascular system: S1 & S2 heard, RRR. No JVD, murmurs, rubs, gallops or clicks. No pedal edema. Gastrointestinal system: Abdomen is nondistended, soft and nontender. No organomegaly or masses felt. Normal bowel sounds heard.  Very mild right CVA tenderness, has nephrostomy tube on the right. Central nervous system: Alert and oriented. No focal neurological deficits. Extremities: Symmetric 5 x 5 power. Skin: No rashes, lesions or ulcers.  Psychiatry:  Judgement and insight appear normal. Mood & affect appropriate.    Data Reviewed: I have personally reviewed following labs and imaging studies  CBC: Recent Labs  Lab 11/15/22 1234 11/16/22 0426  WBC 12.3* 10.0  NEUTROABS 10.2*  --   HGB 10.3* 9.0*  HCT 30.4* 27.4*  MCV 91.6 93.5  PLT 206 180   Basic Metabolic Panel: Recent Labs  Lab 11/15/22 1234 11/16/22 0426 11/17/22 0606 11/18/22 0030  NA 127* 131* 131* 133*  K 3.8 3.5 3.7 3.7  CL 98 101 100 104  CO2 20* 21* 19* 18*  GLUCOSE 165* 87 109* 101*  BUN 38* 29* 21 19  CREATININE 1.91* 1.84* 1.75* 1.63*  CALCIUM 9.1 8.7* 8.9 8.8*   GFR: Estimated Creatinine Clearance: 28.9 mL/min (A) (by C-G formula based on SCr of 1.63 mg/dL (H)). Liver Function Tests: No results for input(s): "AST", "ALT", "ALKPHOS", "BILITOT", "PROT", "ALBUMIN" in the last 168 hours. No results for input(s): "LIPASE", "AMYLASE" in the last 168 hours. No results for input(s): "AMMONIA" in the last 168 hours. Coagulation Profile: Recent Labs  Lab  11/16/22 1207  INR 1.2   Cardiac Enzymes: No results for input(s): "CKTOTAL", "CKMB", "CKMBINDEX", "TROPONINI" in the last 168 hours. BNP (last 3 results) No results for input(s): "PROBNP" in the last 8760 hours. HbA1C: No results for input(s): "HGBA1C" in the last 72 hours. CBG: No results for input(s): "GLUCAP" in the last 168 hours. Lipid Profile: No results for input(s): "CHOL", "HDL", "LDLCALC", "TRIG", "CHOLHDL", "LDLDIRECT" in the last 72 hours. Thyroid Function Tests: No results for input(s): "TSH", "T4TOTAL", "FREET4", "T3FREE", "THYROIDAB" in the last 72 hours. Anemia Panel: No results for input(s): "VITAMINB12", "FOLATE", "FERRITIN", "TIBC", "IRON", "RETICCTPCT" in the last 72 hours. Sepsis Labs: No results for input(s): "PROCALCITON", "LATICACIDVEN" in the last 168 hours.  Recent Results (from the past 240 hour(s))  Urine Culture     Status: Abnormal   Collection Time: 11/15/22 12:40  PM   Specimen: Urine, Clean Catch  Result Value Ref Range Status   Specimen Description   Final    URINE, CLEAN CATCH Performed at [facility], [address], [place], [place] [address]    Special Requests   Final    NONE Performed at [facility], [address], [place], [place] [address]    Culture 50,000 COLONIES/mL ESCHERICHIA COLI (A)  Final   Report Status 11/18/2022 FINAL  Final   Organism ID, Bacteria ESCHERICHIA COLI (A)  Final      Susceptibility   Escherichia coli - MIC*    AMPICILLIN 4 SENSITIVE Sensitive     CEFAZOLIN <=4 SENSITIVE Sensitive     CEFEPIME <=0.12 SENSITIVE Sensitive     CEFTRIAXONE <=0.25 SENSITIVE Sensitive     CIPROFLOXACIN <=0.25 SENSITIVE Sensitive     GENTAMICIN <=1 SENSITIVE Sensitive     IMIPENEM <=0.25 SENSITIVE Sensitive     NITROFURANTOIN 32 SENSITIVE Sensitive     TRIMETH/SULFA <=20 SENSITIVE Sensitive     AMPICILLIN/SULBACTAM <=2 SENSITIVE Sensitive     PIP/TAZO <=4 SENSITIVE Sensitive ug/mL    * 50,000 COLONIES/mL ESCHERICHIA COLI  Aerobic/Anaerobic Culture w Gram Stain (surgical/deep wound)     Status: None (Preliminary result)   Collection Time: 11/17/22 11:06 AM   Specimen: Kidney; Urine  Result Value Ref Range Status   Specimen Description   Final    KIDNEY Performed at [facility], [address], [place], [place] [address]    Special Requests   Final    Normal Performed at [facility], [address], [place], [place] [address]    Gram Stain   Final    ABUNDANT WBC PRESENT, PREDOMINANTLY PMN RARE GRAM NEGATIVE RODS    Culture   Final    CULTURE REINCUBATED FOR BETTER GROWTH Performed at [facility], [address] [place], [place] [address]    Report Status PENDING  Incomplete     Radiology Studies: IR NEPHROSTOMY PLACEMENT RIGHT  Result Date: 11/17/2022 CLINICAL DATA:  Right renal obstruction with clinical evidence of infection  and need for percutaneous nephrostomy tube placement. EXAM: 1. ULTRASOUND GUIDANCE FOR PUNCTURE OF THE RIGHT RENAL COLLECTING SYSTEM. 2. RIGHT PERCUTANEOUS NEPHROSTOMY TUBE PLACEMENT. COMPARISON:  None Available. ANESTHESIA/SEDATION: Moderate (conscious) sedation was employed during this procedure. A total of Versed 4.0 mg and Fentanyl 200 mcg was administered intravenously by radiology nursing. Moderate Sedation Time: 14 minutes. The patient's level of consciousness and vital signs were monitored continuously by radiology nursing throughout the procedure under my direct supervision. CONTRAST:  15 mL Omnipaque 300 MEDICATIONS: None  additional FLUOROSCOPY TIME:  26 seconds.  12.0 mGy. PROCEDURE: The procedure, risks, benefits, and alternatives were explained to the patient. Questions regarding the procedure were encouraged and answered. The patient understands and consents to the procedure. A time-out was performed prior to initiating the procedure. The right flank region was prepped with chlorhexidine in a sterile fashion, and a sterile drape was applied covering the operative field. A sterile gown and sterile gloves were used for the procedure. Local anesthesia was provided with 1% Lidocaine. Ultrasound was used to localize the right kidney. Under direct ultrasound guidance, an 18 gauge trocar needle was advanced into the renal collecting system. Ultrasound image documentation was performed. Aspiration of urine sample was performed followed by contrast injection. Aspirated urine sample was sent for culture analysis. Percutaneous tract dilatation was then performed over the guidewire. A 10-French percutaneous nephrostomy tube was then advanced and formed in the collecting system. Catheter position was confirmed by fluoroscopy after contrast injection. The catheter was secured at the skin with a Prolene retention suture and Stat-Lock device. A gravity bag was placed. COMPLICATIONS: None. FINDINGS: Ultrasound  demonstrates severe hydronephrosis of the right kidney. After access of the upper pole collecting system, aspiration yielded grossly purulent fluid consistent with pyonephrosis. A 20 mL sample of purulent fluid was sent for culture analysis. The nephrostomy tube was formed at the level of the renal pelvis. There is return of purulent fluid with gravity bag drainage. IMPRESSION: High-grade obstruction of the right kidney with return of grossly purulent fluid consistent with pyonephrosis. A fluid sample was sent for culture analysis. A 10 French nephrostomy tube was placed via upper pole access and was placed to gravity bag drainage. Electronically Signed   By: Irish Lack M.D.   On: 11/17/2022 11:24    Scheduled Meds:  amLODipine  5 mg Oral Daily   Chlorhexidine Gluconate Cloth  6 each Topical Daily   docusate sodium  200 mg Oral BID   enoxaparin (LOVENOX) injection  30 mg Subcutaneous Q24H   fesoterodine  4 mg Oral Daily   sodium chloride flush  5 mL Intracatheter Q8H   Continuous Infusions:  sodium chloride 100 mL/hr at 11/18/22 0511   cefTRIAXone (ROCEPHIN)  IV 2 g (11/18/22 1001)     LOS: 3 days   Hughie Closs, MD [facility]  11/18/2022, 11:20 AM   *Please note that this is a verbal dictation therefore any spelling or grammatical errors are due to the "Dragon Medical One" system interpretation.  Please page via Amion and do not message via secure chat for urgent patient care matters. Secure chat can be used for non urgent patient care matters.  How to contact the [facility] Attending or Consulting provider 7A - 7P or covering provider during after hours 7P -7A, for this patient?  Check the care team in [facility] and look for a) attending/consulting TR[facility] provider listed and b) the [facility] team listed. Page or secure chat 7A-7P. Log into [email or web address] and use [facility] universal password to access. If you do not have the password, please contact the hospital operator. Locate the [facility]  provider you are looking for under [facility] and page to a number that you can be directly reached. If you still have difficulty reaching the provider, please page the [facility] (Director on Call) for the Hospitalists listed on amion for assistance.

## 2022-11-18 NOTE — Progress Notes (Signed)
Urology Inpatient Progress Report    Intv/Subj: Patient underwent RIGHT PCN yesterday and had relief of pain. Creatinine improving.  Principal Problem:   Cancer-related breakthrough pain  Current Facility-Administered Medications  Medication Dose Route Frequency Provider Last Rate Last Admin   0.9 %  sodium chloride infusion   Intravenous Continuous Hughie Closs, MD 100 mL/hr at 11/18/22 0511 New Bag at 11/18/22 0511   acetaminophen (TYLENOL) tablet 650 mg  650 mg Oral Q6H PRN Alan Mulder, MD   650 mg at 11/16/22 2033   Or   acetaminophen (TYLENOL) suppository 650 mg  650 mg Rectal Q6H PRN Alan Mulder, MD   650 mg at 11/17/22 2047   amLODipine (NORVASC) tablet 5 mg  5 mg Oral Daily Dorrell, Robert, MD   5 mg at 11/18/22 1002   cefTRIAXone (ROCEPHIN) 2 g in sodium chloride 0.9 % 100 mL IVPB  2 g Intravenous Q24H Alan Mulder, MD 200 mL/hr at 11/18/22 1001 2 g at 11/18/22 1001   Chlorhexidine Gluconate Cloth 2 % PADS 6 each  6 each Topical Daily Hughie Closs, MD   6 each at 11/18/22 1002   docusate sodium (COLACE) capsule 200 mg  200 mg Oral BID Hughie Closs, MD   200 mg at 11/18/22 1003   enoxaparin (LOVENOX) injection 30 mg  30 mg Subcutaneous Q24H Ellington, Abby K, RPH   30 mg at 11/17/22 2047   fesoterodine (TOVIAZ) tablet 4 mg  4 mg Oral Daily Pricilla Riffle, RPH   4 mg at 11/18/22 1002   HYDROmorphone (DILAUDID) tablet 4 mg  4 mg Oral Q4H PRN Hughie Closs, MD       LORazepam (ATIVAN) injection 0.5 mg  0.5 mg Intravenous Q4H PRN Alan Mulder, MD   0.5 mg at 11/17/22 1624   ondansetron (ZOFRAN) tablet 4 mg  4 mg Oral Q6H PRN Alan Mulder, MD       Or   ondansetron (ZOFRAN) injection 4 mg  4 mg Intravenous Q6H PRN Alan Mulder, MD   4 mg at 11/18/22 0865   oxyCODONE (Oxy IR/ROXICODONE) immediate release tablet 5 mg  5 mg Oral Q4H PRN Alan Mulder, MD   5 mg at 11/16/22 1419   sodium chloride flush (NS) 0.9 % injection 5 mL  5 mL Intracatheter Q8H Irish Lack, MD   5 mL at 11/18/22 0403   traZODone (DESYREL) tablet 50 mg  50 mg Oral QHS PRN Alan Mulder, MD   50 mg at 11/16/22 2200     Objective: Vital: Vitals:   11/17/22 1747 11/17/22 2133 11/18/22 0527 11/18/22 0947  BP: 112/68 113/62 (!) 140/68 (!) 145/68  Pulse: (!) 104 (!) 103 93 (!) 101  Resp:  18 14 18   Temp: (!) 100.9 F (38.3 C) 98.6 F (37 C) 99.8 F (37.7 C) 98.2 F (36.8 C)  TempSrc: Oral Oral Oral Oral  SpO2:  93% 94% 99%  Weight:      Height:       I/Os: I/O last 3 completed shifts: In: 250 [P.O.:240; I.V.:5; Other:5] Out: 900 [Urine:900]  Physical Exam:  General: Patient is in no apparent distress Lungs: Normal respiratory effort, chest expands symmetrically. R PCN: draining light red tinged urine, no clots Ext: lower extremities symmetric  Lab Results: Recent Labs    11/16/22 0426  WBC 10.0  HGB 9.0*  HCT 27.4*   Recent Labs    11/16/22 0426 11/17/22 0606 11/18/22 0030  NA 131* 131* 133*  K 3.5 3.7  3.7  CL 101 100 104  CO2 21* 19* 18*  GLUCOSE 87 109* 101*  BUN 29* 21 19  CREATININE 1.84* 1.75* 1.63*  CALCIUM 8.7* 8.9 8.8*   Recent Labs    11/16/22 1207  INR 1.2   No results for input(s): "LABURIN" in the last 72 hours. Results for orders placed or performed during the hospital encounter of 11/15/22  Urine Culture     Status: Abnormal   Collection Time: 11/15/22 12:40 PM   Specimen: Urine, Clean Catch  Result Value Ref Range Status   Specimen Description   Final    URINE, CLEAN CATCH Performed at [facility], [address], [place], [place] [address]    Special Requests   Final    NONE Performed at [facility], [address], [place], [place] [address]    Culture 50,000 COLONIES/mL ESCHERICHIA COLI (A)  Final   Report Status 11/18/2022 FINAL  Final   Organism ID, Bacteria ESCHERICHIA COLI (A)  Final      Susceptibility   Escherichia coli - MIC*    AMPICILLIN 4 SENSITIVE  Sensitive     CEFAZOLIN <=4 SENSITIVE Sensitive     CEFEPIME <=0.12 SENSITIVE Sensitive     CEFTRIAXONE <=0.25 SENSITIVE Sensitive     CIPROFLOXACIN <=0.25 SENSITIVE Sensitive     GENTAMICIN <=1 SENSITIVE Sensitive     IMIPENEM <=0.25 SENSITIVE Sensitive     NITROFURANTOIN 32 SENSITIVE Sensitive     TRIMETH/SULFA <=20 SENSITIVE Sensitive     AMPICILLIN/SULBACTAM <=2 SENSITIVE Sensitive     PIP/TAZO <=4 SENSITIVE Sensitive ug/mL    * 50,000 COLONIES/mL ESCHERICHIA COLI  Aerobic/Anaerobic Culture w Gram Stain (surgical/deep wound)     Status: None (Preliminary result)   Collection Time: 11/17/22 11:06 AM   Specimen: Kidney; Urine  Result Value Ref Range Status   Specimen Description   Final    KIDNEY Performed at [facility], [address] [place], [place] [address]    Special Requests   Final    Normal Performed at [facility], [address], [place], [place] [address]    Gram Stain   Final    ABUNDANT WBC PRESENT, PREDOMINANTLY PMN RARE GRAM NEGATIVE RODS    Culture   Final    CULTURE REINCUBATED FOR BETTER GROWTH Performed at [facility], [address], [place], [place] [address]    Report Status PENDING  Incomplete   *Note: Due to a large number of results and/or encounters for the requested time period, some results have not been displayed. A complete set of results can be found in Results Review.    Studies/Results: IR NEPHROSTOMY PLACEMENT RIGHT  Result Date: 11/17/2022 CLINICAL DATA:  Right renal obstruction with clinical evidence of infection and need for percutaneous nephrostomy tube placement. EXAM: 1. ULTRASOUND GUIDANCE FOR PUNCTURE OF THE RIGHT RENAL COLLECTING SYSTEM. 2. RIGHT PERCUTANEOUS NEPHROSTOMY TUBE PLACEMENT. COMPARISON:  None Available. ANESTHESIA/SEDATION: Moderate (conscious) sedation was employed during this procedure. A total of Versed 4.0 mg and Fentanyl 200 mcg was administered intravenously by  radiology nursing. Moderate Sedation Time: 14 minutes. The patient's level of consciousness and vital signs were monitored continuously by radiology nursing throughout the procedure under my direct supervision. CONTRAST:  15 mL Omnipaque 300 MEDICATIONS: None additional FLUOROSCOPY TIME:  26 seconds.  12.0 mGy. PROCEDURE: The procedure, risks, benefits, and alternatives were explained to the patient. Questions regarding the procedure were encouraged and answered. The patient  understands and consents to the procedure. A time-out was performed prior to initiating the procedure. The right flank region was prepped with chlorhexidine in a sterile fashion, and a sterile drape was applied covering the operative field. A sterile gown and sterile gloves were used for the procedure. Local anesthesia was provided with 1% Lidocaine. Ultrasound was used to localize the right kidney. Under direct ultrasound guidance, an 18 gauge trocar needle was advanced into the renal collecting system. Ultrasound image documentation was performed. Aspiration of urine sample was performed followed by contrast injection. Aspirated urine sample was sent for culture analysis. Percutaneous tract dilatation was then performed over the guidewire. A 10-French percutaneous nephrostomy tube was then advanced and formed in the collecting system. Catheter position was confirmed by fluoroscopy after contrast injection. The catheter was secured at the skin with a Prolene retention suture and Stat-Lock device. A gravity bag was placed. COMPLICATIONS: None. FINDINGS: Ultrasound demonstrates severe hydronephrosis of the right kidney. After access of the upper pole collecting system, aspiration yielded grossly purulent fluid consistent with pyonephrosis. A 20 mL sample of purulent fluid was sent for culture analysis. The nephrostomy tube was formed at the level of the renal pelvis. There is return of purulent fluid with gravity bag drainage. IMPRESSION:  High-grade obstruction of the right kidney with return of grossly purulent fluid consistent with pyonephrosis. A fluid sample was sent for culture analysis. A 10 French nephrostomy tube was placed via upper pole access and was placed to gravity bag drainage. Electronically Signed   By: Irish Lack M.D.   On: 11/17/2022 11:24    Assessment/Plan: Right hydronephrosis secondary to malignant obstruction -s/p PCN tube draining well -continue antibiotics and tailor according to cultures    Kasandra Knudsen, MD Urology 11/18/2022, 11:58 AM

## 2022-11-18 NOTE — Progress Notes (Signed)
   11/18/22 2136  Assess: MEWS Score  Temp (!) 102.7 F (39.3 C)  BP (!) 164/79  MAP (mmHg) 102  Resp 16  SpO2 93 %  O2 Device Room Air  Assess: MEWS Score  MEWS Temp 2  MEWS Systolic 0  MEWS Pulse 1  MEWS RR 0  MEWS LOC 0  MEWS Score 3  MEWS Score Color Yellow  Assess: if the MEWS score is Yellow or Red  Were vital signs accurate and taken at a resting state? Yes  Does the patient meet 2 or more of the SIRS criteria? Yes  Does the patient have a confirmed or suspected source of infection? Yes  MEWS guidelines implemented  Yes, yellow  Treat  MEWS Interventions Considered administering scheduled or prn medications/treatments as ordered  Take Vital Signs  Increase Vital Sign Frequency  Yellow: Q2hr x1, continue Q4hrs until patient remains green for 12hrs  Escalate  MEWS: Escalate Yellow: Discuss with charge nurse and consider notifying provider and/or RRT  Notify: Charge Nurse/RN  Name of Charge Nurse/RN Notified [employer], RN  Provider Notification  Provider Name/Title Chinita Greenland, NP  Date Provider Notified 11/18/22  Time Provider Notified 2137  Method of Notification Page  Notification Reason Other (Comment) (yellow MEWS/pain)  Provider response See new orders  Date of Provider Response 11/18/22  Time of Provider Response 2139  Assess: SIRS CRITERIA  SIRS Temperature  1  SIRS Pulse 1  SIRS Respirations  0  SIRS WBC 0  SIRS Score Sum  2   Tylenol given for temperature and pain med given early per NP.

## 2022-11-19 DIAGNOSIS — Z515 Encounter for palliative care: Secondary | ICD-10-CM | POA: Diagnosis not present

## 2022-11-19 DIAGNOSIS — G893 Neoplasm related pain (acute) (chronic): Secondary | ICD-10-CM | POA: Diagnosis not present

## 2022-11-19 DIAGNOSIS — Z7189 Other specified counseling: Secondary | ICD-10-CM | POA: Diagnosis not present

## 2022-11-19 DIAGNOSIS — C799 Secondary malignant neoplasm of unspecified site: Secondary | ICD-10-CM | POA: Diagnosis not present

## 2022-11-19 LAB — CBC WITH DIFFERENTIAL/PLATELET
Abs Immature Granulocytes: 0.33 10*3/uL — ABNORMAL HIGH (ref 0.00–0.07)
Basophils Absolute: 0 10*3/uL (ref 0.0–0.1)
Basophils Relative: 0 %
Eosinophils Absolute: 0.1 10*3/uL (ref 0.0–0.5)
Eosinophils Relative: 1 %
HCT: 27.1 % — ABNORMAL LOW (ref 36.0–46.0)
Hemoglobin: 9 g/dL — ABNORMAL LOW (ref 12.0–15.0)
Immature Granulocytes: 3 %
Lymphocytes Relative: 6 %
Lymphs Abs: 0.8 10*3/uL (ref 0.7–4.0)
MCH: 30.8 pg (ref 26.0–34.0)
MCHC: 33.2 g/dL (ref 30.0–36.0)
MCV: 92.8 fL (ref 80.0–100.0)
Monocytes Absolute: 1 10*3/uL (ref 0.1–1.0)
Monocytes Relative: 8 %
Neutro Abs: 10.5 10*3/uL — ABNORMAL HIGH (ref 1.7–7.7)
Neutrophils Relative %: 82 %
Platelets: 207 10*3/uL (ref 150–400)
RBC: 2.92 MIL/uL — ABNORMAL LOW (ref 3.87–5.11)
RDW: 16.5 % — ABNORMAL HIGH (ref 11.5–15.5)
WBC: 12.7 10*3/uL — ABNORMAL HIGH (ref 4.0–10.5)
nRBC: 0 % (ref 0.0–0.2)

## 2022-11-19 LAB — BASIC METABOLIC PANEL
Anion gap: 12 (ref 5–15)
BUN: 17 mg/dL (ref 8–23)
CO2: 17 mmol/L — ABNORMAL LOW (ref 22–32)
Calcium: 8.7 mg/dL — ABNORMAL LOW (ref 8.9–10.3)
Chloride: 101 mmol/L (ref 98–111)
Creatinine, Ser: 1.37 mg/dL — ABNORMAL HIGH (ref 0.44–1.00)
GFR, Estimated: 40 mL/min — ABNORMAL LOW (ref >60)
Glucose, Bld: 130 mg/dL — ABNORMAL HIGH (ref 70–99)
Potassium: 3.3 mmol/L — ABNORMAL LOW (ref 3.5–5.1)
Sodium: 130 mmol/L — ABNORMAL LOW (ref 135–145)

## 2022-11-19 LAB — GLUCOSE, CAPILLARY: Glucose-Capillary: 171 mg/dL — ABNORMAL HIGH (ref 70–99)

## 2022-11-19 MED ORDER — HYDROMORPHONE HCL 2 MG PO TABS
2.0000 mg | ORAL_TABLET | ORAL | Status: DC | PRN
  Administered 2022-11-19 – 2022-11-21 (×10): 2 mg via ORAL
  Filled 2022-11-19 (×10): qty 1

## 2022-11-19 MED ORDER — ALUM & MAG HYDROXIDE-SIMETH 200-200-20 MG/5ML PO SUSP
15.0000 mL | ORAL | Status: DC | PRN
  Administered 2022-11-19 (×2): 15 mL via ORAL
  Filled 2022-11-19 (×2): qty 30

## 2022-11-19 MED ORDER — FENTANYL 12 MCG/HR TD PT72
1.0000 | MEDICATED_PATCH | TRANSDERMAL | Status: DC
  Administered 2022-11-19: 1 via TRANSDERMAL
  Filled 2022-11-19: qty 1

## 2022-11-19 MED ORDER — ENOXAPARIN SODIUM 40 MG/0.4ML IJ SOSY
40.0000 mg | PREFILLED_SYRINGE | INTRAMUSCULAR | Status: DC
  Administered 2022-11-19 – 2022-11-20 (×2): 40 mg via SUBCUTANEOUS
  Filled 2022-11-19 (×2): qty 0.4

## 2022-11-19 MED ORDER — SENNA 8.6 MG PO TABS
2.0000 | ORAL_TABLET | Freq: Every day | ORAL | Status: DC
  Administered 2022-11-19 – 2022-11-20 (×2): 17.2 mg via ORAL
  Filled 2022-11-19 (×2): qty 2

## 2022-11-19 MED ORDER — POTASSIUM CHLORIDE CRYS ER 20 MEQ PO TBCR
40.0000 meq | EXTENDED_RELEASE_TABLET | ORAL | Status: AC
  Administered 2022-11-19 (×2): 40 meq via ORAL
  Filled 2022-11-19 (×2): qty 2

## 2022-11-19 MED ORDER — HYDROMORPHONE HCL 1 MG/ML IJ SOLN: 1.0000 mg | INTRAMUSCULAR | Status: DC | PRN

## 2022-11-19 MED ORDER — DEXAMETHASONE 4 MG PO TABS
4.0000 mg | ORAL_TABLET | Freq: Every day | ORAL | Status: DC
  Administered 2022-11-19 – 2022-11-21 (×3): 4 mg via ORAL
  Filled 2022-11-19 (×3): qty 1

## 2022-11-19 MED ORDER — ORAL CARE MOUTH RINSE: 15.0000 mL | OROMUCOSAL | Status: DC | PRN

## 2022-11-19 MED ORDER — HYDROMORPHONE HCL 1 MG/ML IJ SOLN
1.0000 mg | INTRAMUSCULAR | Status: DC | PRN
  Administered 2022-11-19 – 2022-11-21 (×5): 1 mg via INTRAVENOUS
  Filled 2022-11-19 (×8): qty 1

## 2022-11-19 MED ORDER — PANTOPRAZOLE SODIUM 20 MG PO TBEC
20.0000 mg | DELAYED_RELEASE_TABLET | Freq: Every day | ORAL | Status: DC
  Administered 2022-11-19 – 2022-11-21 (×3): 20 mg via ORAL
  Filled 2022-11-19 (×3): qty 1

## 2022-11-19 MED ORDER — LIDOCAINE 5 % EX PTCH
1.0000 | MEDICATED_PATCH | CUTANEOUS | Status: AC
  Administered 2022-11-19: 1 via TRANSDERMAL
  Filled 2022-11-19: qty 1

## 2022-11-19 MED ORDER — FENTANYL CITRATE PF 50 MCG/ML IJ SOSY: 12.5000 ug | PREFILLED_SYRINGE | INTRAMUSCULAR | Status: DC | PRN

## 2022-11-19 MED ORDER — LORAZEPAM 0.5 MG PO TABS
0.5000 mg | ORAL_TABLET | Freq: Four times a day (QID) | ORAL | Status: DC | PRN
  Administered 2022-11-19: 0.5 mg via ORAL
  Administered 2022-11-20: 1 mg via ORAL
  Administered 2022-11-20 – 2022-11-21 (×3): 0.5 mg via ORAL
  Administered 2022-11-21: 1 mg via ORAL
  Filled 2022-11-19 (×2): qty 1
  Filled 2022-11-19 (×2): qty 2
  Filled 2022-11-19 (×2): qty 1

## 2022-11-19 MED ORDER — FENTANYL CITRATE PF 50 MCG/ML IJ SOSY
12.5000 ug | PREFILLED_SYRINGE | INTRAMUSCULAR | Status: AC | PRN
  Administered 2022-11-19 (×2): 12.5 ug via INTRAVENOUS
  Filled 2022-11-19 (×2): qty 1

## 2022-11-19 MED ORDER — POLYETHYLENE GLYCOL 3350 17 G PO PACK
17.0000 g | PACK | Freq: Once | ORAL | Status: AC
  Administered 2022-11-19: 17 g via ORAL
  Filled 2022-11-19: qty 1

## 2022-11-19 NOTE — Progress Notes (Addendum)
PROGRESS NOTE    Laura [name]  ZOX:[id number] DOB: [birth year] DOA: 11/15/2022 PCP: Corwin Levins, MD   Brief Narrative:  HPI: Laura [name] is a 77 y.o. female with medical history significant of ovarian cancer, type 2 diabetes, GERD, IBS who presents emergency department due to intractable pain pain.  Patient was seen by primary care on 10/31.  She was endorsing refractory pain to her home Dilaudid and was given a Toradol injection.  Patient presented to the ER due to refractory symptoms.  Patient states the last 3 days she has been having refractory back pain located over her right flank and right hip with radiation down her legs.  She has had numerous prior presentation to the ER for similar complaints and is taking chronic pain medications at home.  Regarding her cancer she has widespread disease with peritoneal metastasis.  She last saw her oncologist on 10/17.  She is undergoing chemotherapy and radiation does been complicated by right hydronephrosis requiring stent placement.   In the ER lab were obtained which showed sodium 127, creatinine 1.9 baseline 1.3, WBC 12.3, hemoglobin 10.3, platelets 206, urinalysis concerning for infection.  Patient underwent CT abdomen which showed removal of previous placed ureteral stent with severe right hydronephrosis.  Urology was consulted in the emergency department and patient was admitted for further workup.  On admission she was given ceftriaxone for UTI.     Assessment & Plan:   Principal Problem:   Cancer-related breakthrough pain  AKI on CKD stage IIIa/right hydronephrosis/UTI: UA consistent with UTI.  Patient has history of right hydronephrosis, previously had ureteral stent, urology tried to replace but this as outpatient was unsuccessful, she opted to give herself trial of without stent, comes in with right-sided flank pain, secondary to right hydronephrosis.  Has been started on Rocephin, follow culture.  Seen by urology, IR consulted, patient  underwent percutaneous nephrostomy placed on the afternoon of 11/17/2022 by IR.  She developed fever postprocedure and reportedly she had purulent drainage upon placement, culture pending.  Last temperature spike was 102.7 at 9:36 PM on 11/18/2022.  All the cultures are still negative.  Continue current antibiotics and await cultures.  Sepsis secondary to UTI, not POA: Patient developed fever with Tmax of 101.8 along with tachycardia postprocedure/percutaneous nephrostomy on the afternoon of 11/17/2022 meeting criteria for sepsis.  Management as above.   Hypokalemia: Will replenish.  History of metastatic ovarian carcinoma with cancer related pain: Pain very well-controlled on home Dilaudid.  Seen by palliative care.  They had discussion with the family.  Plan is to discharge home with hospice when medically stable.  GERD: Continue Pepcid.  Hypertension: Blood pressure fairly controlled.  Continue home amlodipine.  Prediabetes: Hemoglobin A1c 5.9 just 3 weeks ago, she is on diet control.  Blood sugar very well-controlled.  Does not need SSI.  Obstructive sleep apnea: Patient does not use CPAP.  Insomnia: Continue trazodone.  DVT prophylaxis: enoxaparin (LOVENOX) injection 40 mg Start: 11/19/22 2000 SCDs Start: 11/15/22 1636   Code Status: Limited: Do not attempt resuscitation (DNR) -DNR-LIMITED -Do Not Intubate/DNI   Family Communication: Daughter present at bedside.  Plan of care discussed with patient and daughter in length and he/she verbalized understanding and agreed with it.  Status is: Inpatient Remains inpatient appropriate because: Still febrile   Estimated body mass index is 32.06 kg/m as calculated from the following:   Height as of this encounter: 5\' 3"  (1.6 m).   Weight as of this encounter: 82.1 kg.  Nutritional Assessment: Body mass index is 32.06 kg/m.Marland Kitchen Seen by dietician.  I agree with the assessment and plan as outlined below: Nutrition Status:   Skin  Assessment: I have examined the patient's skin and I agree with the wound assessment as performed by the wound care RN as outlined below:  Consultants:  Urology  Procedures:  None  Antimicrobials:  Anti-infectives (From admission, onward)    Start     Dose/Rate Route Frequency Ordered Stop   11/16/22 1000  cefTRIAXone (ROCEPHIN) 2 g in sodium chloride 0.9 % 100 mL IVPB        2 g 200 mL/hr over 30 Minutes Intravenous Every 24 hours 11/15/22 1658     11/15/22 1515  cefTRIAXone (ROCEPHIN) 1 g in sodium chloride 0.9 % 100 mL IVPB        1 g 200 mL/hr over 30 Minutes Intravenous  Once 11/15/22 1506 11/15/22 1638         Subjective: Patient seen and examined.  Daughter at the bedside.  Patient states that she is feeling better with pain very well-controlled on current pain medications.  Objective: Vitals:   11/18/22 2136 11/18/22 2338 11/19/22 0351 11/19/22 0740  BP: (!) 164/79 (!) 132/59 (!) 156/77 (!) 158/68  Pulse:  97 98 96  Resp: 16 16 16 18   Temp: (!) 102.7 F (39.3 C) 99.4 F (37.4 C) (!) 97.3 F (36.3 C) 98.5 F (36.9 C)  TempSrc: Oral Oral Oral Oral  SpO2: 93% 94% 92% 96%  Weight:      Height:        Intake/Output Summary (Last 24 hours) at 11/19/2022 1153 Last data filed at 11/19/2022 1010 Gross per 24 hour  Intake 5508.7 ml  Output 1700 ml  Net 3808.7 ml   Filed Weights   11/15/22 1111 11/15/22 1753  Weight: 82.1 kg 82.1 kg    Examination:  General exam: Appears calm and comfortable  Respiratory system: Clear to auscultation. Respiratory effort normal. Cardiovascular system: S1 & S2 heard, RRR. No JVD, murmurs, rubs, gallops or clicks. No pedal edema. Gastrointestinal system: Abdomen is nondistended, soft and nontender. No organomegaly or masses felt. Normal bowel sounds heard.  Right nephrostomy tube. Central nervous system: Alert and oriented. No focal neurological deficits. Extremities: Symmetric 5 x 5 power. Skin: No rashes, lesions or ulcers.   Psychiatry: Judgement and insight appear normal. Mood & affect appropriate.    Data Reviewed: I have personally reviewed following labs and imaging studies  CBC: Recent Labs  Lab 11/15/22 1234 11/16/22 0426 11/18/22 2011 11/19/22 0500  WBC 12.3* 10.0 13.6* 12.7*  NEUTROABS 10.2*  --  11.2* 10.5*  HGB 10.3* 9.0* 9.6* 9.0*  HCT 30.4* 27.4* 29.0* 27.1*  MCV 91.6 93.5 93.9 92.8  PLT 206 180 221 207   Basic Metabolic Panel: Recent Labs  Lab 11/15/22 1234 11/16/22 0426 11/17/22 0606 11/18/22 0030 11/19/22 0500  NA 127* 131* 131* 133* 130*  K 3.8 3.5 3.7 3.7 3.3*  CL 98 101 100 104 101  CO2 20* 21* 19* 18* 17*  GLUCOSE 165* 87 109* 101* 130*  BUN 38* 29* 21 19 17   CREATININE 1.91* 1.84* 1.75* 1.63* 1.37*  CALCIUM 9.1 8.7* 8.9 8.8* 8.7*   GFR: Estimated Creatinine Clearance: 34.4 mL/min (A) (by C-G formula based on SCr of 1.37 mg/dL (H)). Liver Function Tests: No results for input(s): "AST", "ALT", "ALKPHOS", "BILITOT", "PROT", "ALBUMIN" in the last 168 hours. No results for input(s): "LIPASE", "AMYLASE" in the last 168 hours.  No results for input(s): "AMMONIA" in the last 168 hours. Coagulation Profile: Recent Labs  Lab 11/16/22 1207  INR 1.2   Cardiac Enzymes: No results for input(s): "CKTOTAL", "CKMB", "CKMBINDEX", "TROPONINI" in the last 168 hours. BNP (last 3 results) No results for input(s): "PROBNP" in the last 8760 hours. HbA1C: No results for input(s): "HGBA1C" in the last 72 hours. CBG: No results for input(s): "GLUCAP" in the last 168 hours. Lipid Profile: No results for input(s): "CHOL", "HDL", "LDLCALC", "TRIG", "CHOLHDL", "LDLDIRECT" in the last 72 hours. Thyroid Function Tests: No results for input(s): "TSH", "T4TOTAL", "FREET4", "T3FREE", "THYROIDAB" in the last 72 hours. Anemia Panel: No results for input(s): "VITAMINB12", "FOLATE", "FERRITIN", "TIBC", "IRON", "RETICCTPCT" in the last 72 hours. Sepsis Labs: No results for input(s):  "PROCALCITON", "LATICACIDVEN" in the last 168 hours.  Recent Results (from the past 240 hour(s))  Urine Culture     Status: Abnormal   Collection Time: 11/15/22 12:40 PM   Specimen: Urine, Clean Catch  Result Value Ref Range Status   Specimen Description   Final    URINE, CLEAN CATCH Performed at [facility], [address], [place], [place] [address]    Special Requests   Final    NONE Performed at [facility], [address], [place], [place] [address]    Culture 50,000 COLONIES/mL ESCHERICHIA COLI (A)  Final   Report Status 11/18/2022 FINAL  Final   Organism ID, Bacteria ESCHERICHIA COLI (A)  Final      Susceptibility   Escherichia coli - MIC*    AMPICILLIN 4 SENSITIVE Sensitive     CEFAZOLIN <=4 SENSITIVE Sensitive     CEFEPIME <=0.12 SENSITIVE Sensitive     CEFTRIAXONE <=0.25 SENSITIVE Sensitive     CIPROFLOXACIN <=0.25 SENSITIVE Sensitive     GENTAMICIN <=1 SENSITIVE Sensitive     IMIPENEM <=0.25 SENSITIVE Sensitive     NITROFURANTOIN 32 SENSITIVE Sensitive     TRIMETH/SULFA <=20 SENSITIVE Sensitive     AMPICILLIN/SULBACTAM <=2 SENSITIVE Sensitive     PIP/TAZO <=4 SENSITIVE Sensitive ug/mL    * 50,000 COLONIES/mL ESCHERICHIA COLI  Aerobic/Anaerobic Culture w Gram Stain (surgical/deep wound)     Status: None (Preliminary result)   Collection Time: 11/17/22 11:06 AM   Specimen: Kidney; Urine  Result Value Ref Range Status   Specimen Description   Final    KIDNEY Performed at [facility], [address] [place], [place] [address]    Special Requests   Final    Normal Performed at [facility], [address] [place], [place] [address]    Gram Stain   Final    ABUNDANT WBC PRESENT, PREDOMINANTLY PMN RARE GRAM NEGATIVE RODS    Culture   Final    FEW GRAM NEGATIVE RODS CULTURE REINCUBATED FOR BETTER GROWTH SUSCEPTIBILITIES TO FOLLOW Performed at [facility], [address], [place], [place] [address]    Report Status PENDING  Incomplete  Culture, blood (Routine X 2) w Reflex to ID Panel     Status: None (Preliminary result)   Collection Time: 11/18/22  9:15 AM   Specimen: BLOOD  Result Value Ref Range Status   Specimen Description   Final    BLOOD BLOOD LEFT ARM AEROBIC BOTTLE ONLY Performed at [facility], [address], [place], [place] [address]    Special Requests   Final    BOTTLES DRAWN AEROBIC ONLY Blood Culture results may not be optimal due  to an inadequate volume of blood received in culture bottles Performed at [facility], [address], [place], [place] [address]    Culture   Final    NO GROWTH < 24 HOURS Performed at [facility], [address], [place], [place] [address]    Report Status PENDING  Incomplete  Culture, blood (Routine X 2) w Reflex to ID Panel     Status: None (Preliminary result)   Collection Time: 11/18/22  9:15 AM   Specimen: BLOOD  Result Value Ref Range Status   Specimen Description   Final    BLOOD BLOOD RIGHT HAND AEROBIC BOTTLE ONLY Performed at [facility], [address] [place], [place] [address]    Special Requests   Final    BOTTLES DRAWN AEROBIC ONLY Blood Culture results may not be optimal due to an inadequate volume of blood received in culture bottles Performed at [facility], [address], [place], [place] [address]    Culture   Final    NO GROWTH < 24 HOURS Performed at [facility], [address], [place], [place] [address]    Report Status PENDING  Incomplete     Radiology Studies: No results found.  Scheduled Meds:  amLODipine  5 mg Oral Daily   Chlorhexidine Gluconate Cloth  6 each Topical Daily   dexamethasone  4 mg Oral Daily   docusate sodium  200 mg Oral BID   enoxaparin (LOVENOX) injection  40 mg Subcutaneous Q24H   fentaNYL  1 patch Transdermal Q72H   fesoterodine  4 mg Oral Daily    lidocaine  1 patch Transdermal Q24H   potassium chloride  40 mEq Oral Q4H   sodium chloride flush  5 mL Intracatheter Q8H   Continuous Infusions:  cefTRIAXone (ROCEPHIN)  IV 2 g (11/19/22 0933)     LOS: 4 days   Hughie Closs, MD [facility]  11/19/2022, 11:53 AM   *Please note that this is a verbal dictation therefore any spelling or grammatical errors are due to the "Dragon Medical One" system interpretation.  Please page via Amion and do not message via secure chat for urgent patient care matters. Secure chat can be used for non urgent patient care matters.  How to contact the [facility] Attending or Consulting provider 7A - 7P or covering provider during after hours 7P -7A, for this patient?  Check the care team in [facility] and look for a) attending/consulting TRH provider listed and b) the [facility] team listed. Page or secure chat 7A-7P. Log into [email or web address] and use [place]'s universal password to access. If you do not have the password, please contact the hospital operator. Locate the [facility] provider you are looking for under [facility] and page to a number that you can be directly reached. If you still have difficulty reaching the provider, please page the [facility] (Director on Call) for the Hospitalists listed on amion for assistance.

## 2022-11-19 NOTE — Plan of Care (Signed)
  Problem: Education: Goal: Knowledge of General Education information will improve Description Including pain rating scale, medication(s)/side effects and non-pharmacologic comfort measures Outcome: Progressing   

## 2022-11-19 NOTE — Consult Note (Signed)
[facility] Liaison Consult Note    11/19/2022  Laura [name] [birth year] [id number]  [facility] [[facility]I] referral as daughter called the office for assistance with home verse hospice   Primary Care Provider: Corwin Levins, MD With [place] at [facility] this provider is listed for the transition of care follow up appointments  and calls   [facility] Liaison screened the patient remotely at [facility].  Spoke with patient's daughter, [name] via phone [phone], regarding inpatient [facility] team and palliative care consult assistance.  HIPAA verified. Explained she has a Geneticist, molecular at [employer].  She was needing the Palliative Care consultants information and given per electronic medical record.   The patient was then screened for readmission risk and  hospitalization with noted extreme risk score for unplanned readmission risk 2 hospital admissions but 8 ED visits in 6 months.  Patient is active with recent [facility] RN and LCSW Coordinators prior to admission noted in encounter and with an upcoming appointment with [facility] LCSW on 11/24/22 noted.  Plan: [facility] Liaison will continue to follow progress and disposition to asess for post hospital community care coordination/management needs.  Referral request for community care coordination: pending outcome of palliative meeting.  Will update Community CC team of post hospital follow up updates.  VBCI Community Care Management/Population Health does not replace or interfere with any arrangements made by the Inpatient Transition of Care team.   For questions contact:   Charlesetta Shanks, RN, BSN, CCM [place]  [facility], [facility] Liaison Direct Dial: [phone] or secure chat Email: [email or web address]

## 2022-11-19 NOTE — Progress Notes (Signed)
   11/19/22 1639  Spiritual Encounters  Type of Visit Initial  Care provided to: Pt and family  Referral source Chaplain team  Reason for visit Routine spiritual support  OnCall Visit No  Spiritual Framework  Presenting Themes Meaning/purpose/sources of inspiration;Values and beliefs;Significant life change;Impactful experiences and emotions;Community and relationships  Values/beliefs belief in God and prayer, church member  Community/Connection Family;Faith community;Spiritual leader  Patient Stress Factors Health changes;Major life changes  Family Stress Factors Major life changes  Interventions  Spiritual Care Interventions Made Established relationship of care and support;Compassionate presence;Reflective listening;Explored values/beliefs/practices/strengths;Meaning making;Bereavement/grief support;Prayer;Encouragement  Spiritual Care Plan  Spiritual Care Issues Still Outstanding Chaplain will continue to follow  Follow up plan  Chaplain Tiburcio Pea will follow up in the AM on 11/7   Chaplain visited with patient and her niece and granddaughter. Patient spoke to chaplain about her faith in God and Jesus. She stated that her pastor had been to the hospital to see her. Patient asked for prayer. Chaplain prayed with patient. Patient stated that she has "important decisions" to make in the morning. Chaplain will follow up in the AM on 11/20/2022.   Arlyce Dice, Chaplain Resident

## 2022-11-19 NOTE — Progress Notes (Signed)
Chaplain engaged in a follow-up visit with Laura [name] and her family. She expressed that she has been in a lot of pain and that she desires to rest. [name] also voiced that it has been hard to have multiple family members in the room as it prevents her from rest. She expressed that she experiences "dread" in having too many people visit her because of the pain she has been in. Chaplain normalized her feelings. She wants her family to understand her needs right now.   Chaplain assesses that [name] wants her pain controlled above all right now. While she loves and values her family, she just desires to rest. Family had questions about palliative care and hospice care today. They believe they have a meeting with Palliative Care later today. Chaplain will reach out to make sure.   Chaplain provided reflective listening, support, answered questions, provided perspective, and will follow-up as needed.   11/19/22 1200  Spiritual Encounters  Type of Visit Initial  Care provided to: Pt and family  Reason for visit Routine spiritual support

## 2022-11-19 NOTE — Progress Notes (Signed)
       Overnight   NAME: Laura [name] MRN: [id number] DOB : 1945/11/26    Date of Service   11/19/2022   HPI/Events of Note    Notified by RN for continued pain in the area described earlier in notation (hip, leg) refractory to other medications  Added PRN medication for interim and added topical anesthetic patch with increased pain relief.  Combination alleviated pain for +/- 4 hours     Interventions/ Plan   Consider ongoing Lidocaine patch use in the area of the groin/thigh  PRN for breakthrough added  Reposition with knees slightly elevated to remove stretch in the offending nerve area. Continue all previous Attending orders.        Chinita Greenland BSN MSNA MSN ACNPC-AG Acute Care Nurse Practitioner [facility]

## 2022-11-19 NOTE — Progress Notes (Signed)
Daily Progress Note   Patient Name: Laura [name]       Date: 11/19/2022 DOB: 22-Sep-1945  Age: 77 y.o. MRN#: [id number] Attending Physician: Laura Closs, MD Primary Care Physician: Laura Levins, MD Admit Date: 11/15/2022  Reason for Consultation/Follow-up: Establishing goals of care  Patient Profile/HPI:  77 y.o. female with past medical history of metastatic ovarian cancer with retroperitoneal mass encasing ureter, retroperitoneal adenopathy- s/p chemotherapy that was stopped due to intolerance, type 2 diabetes, GERD, and IBS who presented to the ED on 11/15/2022 with intractable pain. Urinalysis was concerning for infection. CT abdomen showed severe right hydronephrosis and removal of previously placed ureteral stent with significantly progressed R hydronephrosis as well. She was admitted with cancer-related pain secondary to metastatic ovarian cancer as well as obstructive uropathy with resultant AKI. Nephrostomy completed on 11/4 by IR- has had post procedure fever spikes and purulent drainage- cultures pending.   Palliative medicine consulted for "pain control".   Initial Palliative consult completed 11/3. Seen last on 11/5. At that time pain was well controlled and plan is to maximize hospitalization and d/c home with hospice.   Subjective: Chart reviewed including labs, progress notes, imaging from this and previous encounters.  Evaluated patient. She reports pain is relieved when prn medication is administered, but returns before next dose is due. She is a little sleepy. Some confusion. Has dry mouth. Received call from patent's daughter, Laura [name]. Laura [name] is uncertain about plan to take patient home. She does not feel that she and family can adequately care. She requested Palliative to meet  with her and her other family members.  Plan made to meet tomorrow afternoon at 1530.  Review of Systems  Gastrointestinal:  Positive for abdominal pain.  Psychiatric/Behavioral:  The patient does not have insomnia.      Physical Exam Vitals and nursing note reviewed.  Constitutional:      General: She is not in acute distress.    Appearance: She is ill-appearing.  Cardiovascular:     Rate and Rhythm: Normal rate.  Pulmonary:     Effort: Pulmonary effort is normal.  Abdominal:     General: There is no distension.  Skin:    General: Skin is warm and dry.  Neurological:     Mental Status: She is alert. Mental status is at baseline.  Vital Signs: BP (!) 158/68 (BP Location: Right Arm)   Pulse 96   Temp 98.5 F (36.9 C) (Oral)   Resp 18   Ht 5\' 3"  (1.6 m)   Wt 82.1 kg   SpO2 96%   BMI 32.06 kg/m  SpO2: SpO2: 96 % O2 Device: O2 Device: Room Air O2 Flow Rate: O2 Flow Rate (L/min): 3 L/min  Intake/output summary:  Intake/Output Summary (Last 24 hours) at 11/19/2022 1348 Last data filed at 11/19/2022 1010 Gross per 24 hour  Intake 5508.7 ml  Output 1700 ml  Net 3808.7 ml   LBM: Last BM Date : 11/14/22 Baseline Weight: Weight: 82.1 kg Most recent weight: Weight: 82.1 kg       Palliative Assessment/Data: PPS: 40%      Patient Active Problem List   Diagnosis Date Noted   Cancer-related breakthrough pain 11/15/2022   Primary cancer of ovary with widespread metastatic disease (HCC) 10/10/2022   Intractable abdominal pain 06/15/2022   Hydronephrosis of right kidney 06/15/2022   Constipation 06/07/2022   Stage 3b chronic kidney disease (CKD) (HCC) 02/14/2022   Swelling of lower leg 01/24/2022   Pain and swelling of left lower leg 11/09/2021   Chest pain 08/09/2021   DOE (dyspnea on exertion) 08/09/2021   Anxiety 08/09/2021   Paronychia of great toe of right foot 05/21/2021   COVID-19 virus infection 07/15/2020   Left leg pain 05/19/2020   Ovarian  cancer (HCC) 01/03/2020   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/2020   Nontraumatic tear of right tibialis posterior tendon 12/14/2019   Right ankle pain 12/07/2019   Right ankle tendonitis 11/11/2019   Aortic atherosclerosis (HCC) 10/12/2019   Dehydration 10/06/2019   Frequent urination at night 08/26/2019   Port-A-Cath in place 07/21/2019   Genetic testing 07/15/2019   Elevated CA-125 07/07/2019   Family history of thyroid cancer    Malignant neoplasm of both ovaries 06/20/2019   Paronychia, finger, left 04/15/2019   Abdominal pain 04/04/2019   Retroperitoneal mass 04/03/2019   Acute gouty arthritis 12/17/2018   Bilateral hearing loss 12/21/2017   Eustachian tube disorder, bilateral 12/21/2017   Cough 02/23/2017   Left hand pain 05/26/2016   Primary osteoarthritis of both first carpometacarpal joints 05/26/2016   Skin avulsion 05/26/2016   Adhesive capsulitis of left shoulder 03/20/2016   Fatigue 12/16/2015   OAB (overactive bladder) 03/09/2015   Hearing loss, sensorineural, asymmetrical 11/09/2014   Vestibular hypofunction, left 11/09/2014   Obesity 07/25/2014   Diarrhea 05/02/2014   Dizziness 04/12/2014   Neuropathy    Blurred vision 02/15/2013   Eye pain 02/15/2013   Left shoulder pain 04/15/2012   Encounter for well adult exam with abnormal findings 10/22/2011   Cervical disc disease 10/22/2011   Bilateral hand pain 08/05/2011   SNHL (sensorineural hearing loss) 08/04/2011   Hypertension 07/01/2011   GERD (gastroesophageal reflux disease) 06/29/2011   Bloating 06/29/2011   Back pain 03/13/2011   Nystagmus 01/04/2011   Insomnia 10/21/2010   Other constipation 08/17/2009   Vitamin D deficiency 05/15/2009   Irritable bowel syndrome with constipation 01/16/2009   Primary osteoarthritis of both hands 12/15/2008   Diabetes mellitus type 2, noninsulin dependent (HCC) 11/03/2008   Hyperlipidemia 11/03/2008   OVERACTIVE BLADDER  11/03/2008   OSA (obstructive sleep apnea) 11/03/2008   MURMUR 11/03/2008   Overactive bladder 11/03/2008    Palliative Care Assessment & Plan    Assessment/Recommendations/Plan  Change po hydromorphone to 2mg  po q2hr prn Continue fentanyl patch  LBM 11/1- likely constipation due to chronic opioid- start senna 2 po nightly- give miralax x 1 today She has been referred for hospice services at home   Code Status: DNR  Prognosis:  < 6 months  Discharge Planning: To Be Determined  Care plan was discussed with patient and daughter.  Thank you for allowing the Palliative Medicine Team to assist in the care of this patient.  Total time:  60 minutes Prolonged billing:  Time includes:   Preparing to see the patient (e.g., review of tests) Obtaining and/or reviewing separately obtained history Performing a medically necessary appropriate examination and/or evaluation Counseling and educating the patient/family/caregiver Ordering medications, tests, or procedures Referring and communicating with other health care professionals (when not reported separately) Documenting clinical information in the electronic or other health record Independently interpreting results (not reported separately) and communicating results to the patient/family/caregiver Care coordination (not reported separately) Clinical documentation  Ocie Bob, AGNP-C Palliative Medicine   Please contact Palliative Medicine Team phone at [phone] for questions and concerns.

## 2022-11-19 NOTE — Progress Notes (Signed)
[facility] Room 1615 [facility] Liaison Note  Referral received for patient wanting more information on Hospice services. Met with patient at bedside and provided information on Hospice services at home vs. IPU.  Per request of patient, will follow up tomorrow.  Thank you for the opportunity to participate in this patient's care. Please don't hesitate to call with any hospice related questions.  Henderson Newcomer, [facility] [phone]

## 2022-11-19 NOTE — Plan of Care (Signed)

## 2022-11-19 NOTE — Progress Notes (Signed)
Laura [name] was sleeping at time of visit but [name] will close consult now and follow-up later today.     11/19/22 1000  Spiritual Encounters  Type of Visit Initial  Care provided to: Pt not available  Reason for visit Advance directives

## 2022-11-19 NOTE — Plan of Care (Signed)

## 2022-11-19 NOTE — Progress Notes (Addendum)
Palliative Medicine Progress Note   Patient Name: Laura [name]       Date: 11/19/2022 DOB: [birth year]  Age: 77 y.o. MRN#: [id number] Attending Physician: Hughie Closs, MD Primary Care Physician: Corwin Levins, MD Admit Date: 11/15/2022    HPI/Patient Profile: 77 y.o. female  with past medical history of metastatic ovarian cancer, type 2 diabetes, GERD, and IBS who presented to the ED on 11/15/2022 with intractable pain. Urinalysis was concerning for infection.  CT abdomen showed severe right hydronephrosis and removal of previously placed ureteral stent. She was admitted with cancer-related pain secondary to widely metastatic ovarian cancer as well as obstructive uropathy with resultant AKI.    Palliative medicine was consulted for "pain control".   Subjective: Chart reviewed and update received from RN.   Bedside visit. Patient is OOB to the recliner. She is alert and calm, but confused and not able to fully participate in GOC discussion. Her pain seems currently well-controlled.  I later spoke with her daughter/Fatima by phone.  Ongoing conversation and education offered regarding patient's widely metastatic ovarian cancer.  Daughter is understandably emotional; she reports that the family was not aware that patient's condition was terminal.  I discussed with daughter my recommendation to consider hospice support at home. Provided education and counseling on the philosophy and benefits of hospice care. Discussed that it can help improve quality of life in the setting of terminal illness, while allowing the natural course to occur.  Discussed that hospice can help with symptom management, and personal care. Daughter verbalizes understanding and wishes to proceed with hospice referral. She wants  her mother to "spend the time she has left" at home with family and "not in pain". She will speak with the rest of the family tonight and let them know the situation.  Emotional support provided.  All questions and concerns were addressed to the best of my ability.   Objective:  Physical Exam Vitals reviewed.  Constitutional:      General: She is not in acute distress.    Appearance: She is ill-appearing.  Pulmonary:     Effort: No respiratory distress.  Neurological:     Mental Status: She is alert. She is confused.              Palliative Medicine Assessment & Plan   Assessment:  Principal Problem:   Cancer-related breakthrough pain    Recommendations/Plan: Continue current supportive measures Goal is to get patient home with hospice once she is medically optimized and pain is adequately controlled PMT will continue to follow  Symptom management: Start dexamethasone 4 mg daily Start fentanyl 12 mcg patch Continue Dilaudid 4 mg p.o. every 4 hours as needed Continue lidocaine patch D/C oxycodone Ativan 0.5-1 mg every 6 hours as needed for anxiety   Code Status: DNR - Limited   Prognosis:  < 6 months  Discharge Planning: Home with Hospice   Thank you for allowing the Palliative Medicine Team to assist in the care of this patient.  Time: 65 minutes  Detailed review of medical records (labs, imaging, vital signs), medically appropriate exam, discussed with treatment team, counseling and education to patient, family, & staff, documenting clinical information, medication management, coordination of care.   Merry Proud, NP   Please contact Palliative Medicine Team phone at [phone] for questions and concerns.  For individual providers, please see AMION.

## 2022-11-20 DIAGNOSIS — C569 Malignant neoplasm of unspecified ovary: Secondary | ICD-10-CM

## 2022-11-20 DIAGNOSIS — G893 Neoplasm related pain (acute) (chronic): Secondary | ICD-10-CM | POA: Diagnosis not present

## 2022-11-20 LAB — CBC WITH DIFFERENTIAL/PLATELET
Abs Immature Granulocytes: 0.42 10*3/uL — ABNORMAL HIGH (ref 0.00–0.07)
Basophils Absolute: 0 10*3/uL (ref 0.0–0.1)
Basophils Relative: 0 %
Eosinophils Absolute: 0 10*3/uL (ref 0.0–0.5)
Eosinophils Relative: 0 %
HCT: 28.7 % — ABNORMAL LOW (ref 36.0–46.0)
Hemoglobin: 9.8 g/dL — ABNORMAL LOW (ref 12.0–15.0)
Immature Granulocytes: 3 %
Lymphocytes Relative: 8 %
Lymphs Abs: 1 10*3/uL (ref 0.7–4.0)
MCH: 31 pg (ref 26.0–34.0)
MCHC: 34.1 g/dL (ref 30.0–36.0)
MCV: 90.8 fL (ref 80.0–100.0)
Monocytes Absolute: 0.7 10*3/uL (ref 0.1–1.0)
Monocytes Relative: 5 %
Neutro Abs: 11 10*3/uL — ABNORMAL HIGH (ref 1.7–7.7)
Neutrophils Relative %: 84 %
Platelets: 232 10*3/uL (ref 150–400)
RBC: 3.16 MIL/uL — ABNORMAL LOW (ref 3.87–5.11)
RDW: 16.2 % — ABNORMAL HIGH (ref 11.5–15.5)
WBC: 13.1 10*3/uL — ABNORMAL HIGH (ref 4.0–10.5)
nRBC: 0 % (ref 0.0–0.2)

## 2022-11-20 LAB — COMPREHENSIVE METABOLIC PANEL
ALT: 29 U/L (ref 0–44)
AST: 27 U/L (ref 15–41)
Albumin: 2.6 g/dL — ABNORMAL LOW (ref 3.5–5.0)
Alkaline Phosphatase: 139 U/L — ABNORMAL HIGH (ref 38–126)
Anion gap: 13 (ref 5–15)
BUN: 16 mg/dL (ref 8–23)
CO2: 19 mmol/L — ABNORMAL LOW (ref 22–32)
Calcium: 9.5 mg/dL (ref 8.9–10.3)
Chloride: 101 mmol/L (ref 98–111)
Creatinine, Ser: 1.2 mg/dL — ABNORMAL HIGH (ref 0.44–1.00)
GFR, Estimated: 46 mL/min — ABNORMAL LOW (ref >60)
Glucose, Bld: 158 mg/dL — ABNORMAL HIGH (ref 70–99)
Potassium: 4.4 mmol/L (ref 3.5–5.1)
Sodium: 133 mmol/L — ABNORMAL LOW (ref 135–145)
Total Bilirubin: 0.3 mg/dL (ref <1.2)
Total Protein: 6.9 g/dL (ref 6.5–8.1)

## 2022-11-20 MED ORDER — FUROSEMIDE 10 MG/ML IJ SOLN
40.0000 mg | Freq: Once | INTRAMUSCULAR | Status: AC
  Administered 2022-11-20: 40 mg via INTRAVENOUS
  Filled 2022-11-20: qty 4

## 2022-11-20 MED ORDER — FUROSEMIDE 20 MG PO TABS
20.0000 mg | ORAL_TABLET | Freq: Every day | ORAL | Status: DC
  Administered 2022-11-21: 20 mg via ORAL
  Filled 2022-11-20: qty 1

## 2022-11-20 NOTE — Progress Notes (Signed)
Physical Therapy Treatment Patient Details Name: Laura [name] MRN: [id number] DOB: Nov 08, 1945 Today's Date: 11/20/2022   History of Present Illness Patient is a 77 year old female who presented to the ED on 11/2 with intractable pain. Patient was admitted with right hydronephrosis secondary to malignant obstruction. Patient underwent right PCN placement on 11/4. PMH: ovarian cancer, DM II, GERD, IBS    PT Comments  Pt ambulated 8' with RW, distance limited by R low back and buttock pain. Instructed pt and her 2 granddaughters in seated BUE/LE exercises and encouraged pt to do these independently. Pt puts forth good effort, activity tolerance limited by pain.     If plan is discharge home, recommend the following: A little help with walking and/or transfers;A little help with bathing/dressing/bathroom;Assistance with cooking/housework;Assist for transportation;Help with stairs or ramp for entrance   Can travel by private vehicle     Yes  Equipment Recommendations  None recommended by PT    Recommendations for Other Services       Precautions / Restrictions Precautions Precautions: Fall Precaution Comments: RLQ posterior drain Restrictions Weight Bearing Restrictions: No     Mobility  Bed Mobility Overal bed mobility: Needs Assistance Bed Mobility: Supine to Sit     Supine to sit: Min assist, HOB elevated, Used rails     General bed mobility comments: with increased time, min A to raise trunk    Transfers Overall transfer level: Needs assistance Equipment used: Rolling walker (2 wheels) Transfers: Sit to/from Stand Sit to Stand: Min assist           General transfer comment: VCs for hand placement    Ambulation/Gait Ambulation/Gait assistance: Contact guard assist Gait Distance (Feet): 8 Feet Assistive device: Rolling walker (2 wheels) Gait Pattern/deviations: Step-through pattern, Decreased stride length, Trunk flexed Gait velocity: decr     General Gait  Details: steady, no loss of balance, distance limited by pain   Stairs             Wheelchair Mobility     Tilt Bed    Modified Rankin (Stroke Patients Only)       Balance Overall balance assessment: Mild deficits observed, not formally tested                                          Cognition Arousal: Alert Behavior During Therapy: WFL for tasks assessed/performed Overall Cognitive Status: Within Functional Limits for tasks assessed                                 General Comments: patient was plesant and cooperative during session.        Exercises General Exercises - Upper Extremity Shoulder Flexion: AROM, Both, 10 reps, Seated General Exercises - Lower Extremity Ankle Circles/Pumps: AROM, Both, 10 reps, Supine Short Arc Quad: AROM, Both, 10 reps, Supine    General Comments General comments (skin integrity, edema, etc.): noted lateral jaw movements with speech      Pertinent Vitals/Pain Pain Assessment Pain Score: 6  Pain Location: R buttock and low back Pain Descriptors / Indicators: Discomfort, Grimacing, Spasm, Stabbing Pain Intervention(s): Limited activity within patient's tolerance, Monitored during session, Premedicated before session    Home Living  Prior Function            PT Goals (current goals can now be found in the care plan section) Acute Rehab PT Goals Patient Stated Goal: go home PT Goal Formulation: With patient/family Time For Goal Achievement: 12/02/22 Potential to Achieve Goals: Fair Progress towards PT goals: Progressing toward goals    Frequency    Min 1X/week      PT Plan      Co-evaluation              AM-PAC PT "6 Clicks" Mobility   Outcome Measure  Help needed turning from your back to your side while in a flat bed without using bedrails?: A Little Help needed moving from lying on your back to sitting on the side of a flat bed  without using bedrails?: A Lot Help needed moving to and from a bed to a chair (including a wheelchair)?: A Little Help needed standing up from a chair using your arms (e.g., wheelchair or bedside chair)?: A Little Help needed to walk in hospital room?: A Little Help needed climbing 3-5 steps with a railing? : Total 6 Click Score: 15    End of Session Equipment Utilized During Treatment: Gait belt Activity Tolerance: Patient limited by pain Patient left: in chair;with chair alarm set;with call bell/phone within reach;with family/visitor present Nurse Communication: Mobility status PT Visit Diagnosis: Unsteadiness on feet (R26.81)     Time: 4098-1191 PT Time Calculation (min) (ACUTE ONLY): 21 min  Charges:    $Gait Training: 8-22 mins PT General Charges $$ ACUTE PT VISIT: 1 Visit                     Tamala Ser PT 11/20/2022  Acute Rehabilitation Services  Office [phone]

## 2022-11-20 NOTE — TOC Progression Note (Addendum)
Transition of Care [facility]) - Progression Note    Patient Details  Name: Laura [name] MRN: [id number] Date of Birth: 21-Mar-1945  Transition of Care [facility]) CM/SW Contact  Beckie Busing, RN Phone Number:[phone]  11/20/2022, 3:27 PM  Clinical Narrative:    [facility] acknowledges consult for SNF placement. TOC has been following. Palliative goals of care meeting this afternoon. CM will assess for disposition needs after meeting.   1936 Per palliative family may be interested in taking patient home with hospice services but currently still has questions. [facility] liaison Misty to follow up with family. TOC following        Expected Discharge Plan and Services                                               Social Determinants of Health (SDOH) Interventions SDOH Screenings   Food Insecurity: No Food Insecurity (11/15/2022)  Housing: Patient Declined (11/15/2022)  Transportation Needs: No Transportation Needs (11/15/2022)  Utilities: Not At Risk (11/15/2022)  Alcohol Screen: Low Risk  (06/17/2021)  Depression (PHQ2-9): High Risk (06/04/2022)  Financial Resource Strain: Low Risk  (11/13/2022)  Physical Activity: Unknown (11/13/2022)  Social Connections: Moderately Integrated (11/13/2022)  Stress: Stress Concern Present (11/13/2022)  Tobacco Use: Low Risk  (11/16/2022)  Health Literacy: Inadequate Health Literacy (09/04/2022)    Readmission Risk Interventions     No data to display

## 2022-11-20 NOTE — Progress Notes (Signed)
Chaplain engaged in follow-up with [name], her granddaughter, and another family friend. Khaleesi expressed she was doing a lot better today and that she felt better after being cleaned and having her room cleaned. Granddaughter has been worried about Aujanae keep her spirit and faith high during this time. Chaplain offered a compassionate and affirming presence. Chaplains are available to provide support as needed.    11/20/22 1200  Spiritual Encounters  Type of Visit Follow up  Care provided to: Pt and family  Reason for visit Routine spiritual support

## 2022-11-20 NOTE — Progress Notes (Signed)
PROGRESS NOTE    Laura [name]  VHQ:[id number] DOB: 17-Jul-1945 DOA: 11/15/2022 PCP: Corwin Levins, MD   Brief Narrative:  HPI: Laura [name] is a 77 y.o. female with medical history significant of ovarian cancer, type 2 diabetes, GERD, IBS who presents emergency department due to intractable pain pain.  Patient was seen by primary care on 10/31.  She was endorsing refractory pain to her home Dilaudid and was given a Toradol injection.  Patient presented to the ER due to refractory symptoms.  Patient states the last 3 days she has been having refractory back pain located over her right flank and right hip with radiation down her legs.  She has had numerous prior presentation to the ER for similar complaints and is taking chronic pain medications at home.  Regarding her cancer she has widespread disease with peritoneal metastasis.  She last saw her oncologist on 10/17.  She is undergoing chemotherapy and radiation does been complicated by right hydronephrosis requiring stent placement.   In the ER lab were obtained which showed sodium 127, creatinine 1.9 baseline 1.3, WBC 12.3, hemoglobin 10.3, platelets 206, urinalysis concerning for infection.  Patient underwent CT abdomen which showed removal of previous placed ureteral stent with severe right hydronephrosis.  Urology was consulted in the emergency department and patient was admitted for further workup.  On admission she was given ceftriaxone for UTI.     Assessment & Plan:   Principal Problem:   Cancer-related breakthrough pain  AKI on CKD stage IIIa/right hydronephrosis/UTI: UA consistent with UTI.  Patient has history of right hydronephrosis, previously had ureteral stent, urology tried to replace but this as outpatient was unsuccessful, she opted to give herself trial of without stent, comes in with right-sided flank pain, secondary to right hydronephrosis.  Has been started on Rocephin, follow culture.  Seen by urology, IR consulted, patient  underwent percutaneous nephrostomy placed on the afternoon of 11/17/2022 by IR.  She developed fever postprocedure and reportedly she had purulent drainage upon placement, culture pending.  Last temperature spike was 102.7 at 9:36 PM on 11/18/2022.  Cultures from the drainage is growing E. coli, final identification and sensitivities pending.  Blood culture negative..  Continue current antibiotics and await cultures.  Renal function back to baseline.  Sepsis secondary to UTI, not POA: Patient developed fever with Tmax of 101.8 along with tachycardia postprocedure/percutaneous nephrostomy on the afternoon of 11/17/2022 meeting criteria for sepsis.  Management as above.   Hypokalemia: Resolved.  History of metastatic ovarian carcinoma with cancer related pain: Pain very well-controlled on home Dilaudid.  Seen by palliative care.  They had discussion with the family.  Plan is to discharge home with hospice when medically stable.  GERD: Continue Pepcid.  Hypertension: Blood pressure fairly controlled.  Continue home amlodipine.  Prediabetes: Hemoglobin A1c 5.9 just 3 weeks ago, she is on diet control.  Blood sugar very well-controlled.  Does not need SSI.  Obstructive sleep apnea: Patient does not use CPAP.  Insomnia: Continue trazodone.  Bilateral lower extremity edema: Granddaughter was slightly concerned about her developing edema.  Looks like she has history of edema for which she takes Lasix at home.  I have ordered her 1 dose of IV Lasix 40 mg and will resume home dose of p.o. Lasix starting tomorrow.  Goal of care: Palliative care involved and they are going to have a meeting with the family today.  DVT prophylaxis: enoxaparin (LOVENOX) injection 40 mg Start: 11/19/22 2000 SCDs Start: 11/15/22 1636  Code Status: Limited: Do not attempt resuscitation (DNR) -DNR-LIMITED -Do Not Intubate/DNI   Family Communication: Granddaughter present at bedside.  Plan of care discussed with patient and  daughter in length and he/she verbalized understanding and agreed with it.  Status is: Inpatient Remains inpatient appropriate because: Awaiting final disposition as family is going to have meeting with palliative care today to decide   Estimated body mass index is 32.06 kg/m as calculated from the following:   Height as of this encounter: 5\' 3"  (1.6 m).   Weight as of this encounter: 82.1 kg.    Nutritional Assessment: Body mass index is 32.06 kg/m.Marland Kitchen Seen by dietician.  I agree with the assessment and plan as outlined below: Nutrition Status:   Skin Assessment: I have examined the patient's skin and I agree with the wound assessment as performed by the wound care RN as outlined below:  Consultants:  Urology  Procedures:  None  Antimicrobials:  Anti-infectives (From admission, onward)    Start     Dose/Rate Route Frequency Ordered Stop   11/16/22 1000  cefTRIAXone (ROCEPHIN) 2 g in sodium chloride 0.9 % 100 mL IVPB        2 g 200 mL/hr over 30 Minutes Intravenous Every 24 hours 11/15/22 1658     11/15/22 1515  cefTRIAXone (ROCEPHIN) 1 g in sodium chloride 0.9 % 100 mL IVPB        1 g 200 mL/hr over 30 Minutes Intravenous  Once 11/15/22 1506 11/15/22 1638         Subjective: Patient seen and examined.  She was sitting in the recliner, granddaughter at the bedside.  Patient had no complaint.  Pain very well-controlled.  She was extremely thankful for the care that she has received during her hospitalization from everybody involved.  Objective: Vitals:   11/19/22 1946 11/20/22 0542 11/20/22 0904 11/20/22 1339  BP: (!) 158/89 (!) 159/86 (!) 152/82 (!) 150/79  Pulse: 98 87 85 83  Resp: 20 17 16 18   Temp: 98.3 F (36.8 C) 98.2 F (36.8 C) 98 F (36.7 C) 97.8 F (36.6 C)  TempSrc: Oral Oral Oral Oral  SpO2: 99% 97% 100% 99%  Weight:      Height:        Intake/Output Summary (Last 24 hours) at 11/20/2022 1517 Last data filed at 11/20/2022 1339 Gross per 24 hour   Intake 850 ml  Output 1450 ml  Net -600 ml   Filed Weights   11/15/22 1111 11/15/22 1753  Weight: 82.1 kg 82.1 kg    Examination:  General exam: Appears calm and comfortable  Respiratory system: Clear to auscultation. Respiratory effort normal. Cardiovascular system: S1 & S2 heard, RRR. No JVD, murmurs, rubs, gallops or clicks. No pedal edema. Gastrointestinal system: Abdomen is nondistended, soft and nontender. No organomegaly or masses felt. Normal bowel sounds heard.  Nephrostomy tube on the right side. Central nervous system: Alert and oriented. No focal neurological deficits. Extremities: Symmetric 5 x 5 power. Skin: No rashes, lesions or ulcers.  Psychiatry: Judgement and insight appear normal. Mood & affect appropriate.    Data Reviewed: I have personally reviewed following labs and imaging studies  CBC: Recent Labs  Lab 11/15/22 1234 11/16/22 0426 11/18/22 2011 11/19/22 0500 11/20/22 0619  WBC 12.3* 10.0 13.6* 12.7* 13.1*  NEUTROABS 10.2*  --  11.2* 10.5* 11.0*  HGB 10.3* 9.0* 9.6* 9.0* 9.8*  HCT 30.4* 27.4* 29.0* 27.1* 28.7*  MCV 91.6 93.5 93.9 92.8 90.8  PLT 206 180  221 207 232   Basic Metabolic Panel: Recent Labs  Lab 11/16/22 0426 11/17/22 0606 11/18/22 0030 11/19/22 0500 11/20/22 0619  NA 131* 131* 133* 130* 133*  K 3.5 3.7 3.7 3.3* 4.4  CL 101 100 104 101 101  CO2 21* 19* 18* 17* 19*  GLUCOSE 87 109* 101* 130* 158*  BUN 29* 21 19 17 16   CREATININE 1.84* 1.75* 1.63* 1.37* 1.20*  CALCIUM 8.7* 8.9 8.8* 8.7* 9.5   GFR: Estimated Creatinine Clearance: 39.2 mL/min (A) (by C-G formula based on SCr of 1.2 mg/dL (H)). Liver Function Tests: Recent Labs  Lab 11/20/22 0619  AST 27  ALT 29  ALKPHOS 139*  BILITOT 0.3  PROT 6.9  ALBUMIN 2.6*   No results for input(s): "LIPASE", "AMYLASE" in the last 168 hours. No results for input(s): "AMMONIA" in the last 168 hours. Coagulation Profile: Recent Labs  Lab 11/16/22 1207  INR 1.2   Cardiac  Enzymes: No results for input(s): "CKTOTAL", "CKMB", "CKMBINDEX", "TROPONINI" in the last 168 hours. BNP (last 3 results) No results for input(s): "PROBNP" in the last 8760 hours. HbA1C: No results for input(s): "HGBA1C" in the last 72 hours. CBG: Recent Labs  Lab 11/19/22 2326  GLUCAP 171*   Lipid Profile: No results for input(s): "CHOL", "HDL", "LDLCALC", "TRIG", "CHOLHDL", "LDLDIRECT" in the last 72 hours. Thyroid Function Tests: No results for input(s): "TSH", "T4TOTAL", "FREET4", "T3FREE", "THYROIDAB" in the last 72 hours. Anemia Panel: No results for input(s): "VITAMINB12", "FOLATE", "FERRITIN", "TIBC", "IRON", "RETICCTPCT" in the last 72 hours. Sepsis Labs: No results for input(s): "PROCALCITON", "LATICACIDVEN" in the last 168 hours.  Recent Results (from the past 240 hour(s))  Urine Culture     Status: Abnormal   Collection Time: 11/15/22 12:40 PM   Specimen: Urine, Clean Catch  Result Value Ref Range Status   Specimen Description   Final    URINE, CLEAN CATCH Performed at [facility], [address] [place], [place] [address]    Special Requests   Final    NONE Performed at [facility], [address], [place], [place] [address]    Culture 50,000 COLONIES/mL ESCHERICHIA COLI (A)  Final   Report Status 11/18/2022 FINAL  Final   Organism ID, Bacteria ESCHERICHIA COLI (A)  Final      Susceptibility   Escherichia coli - MIC*    AMPICILLIN 4 SENSITIVE Sensitive     CEFAZOLIN <=4 SENSITIVE Sensitive     CEFEPIME <=0.12 SENSITIVE Sensitive     CEFTRIAXONE <=0.25 SENSITIVE Sensitive     CIPROFLOXACIN <=0.25 SENSITIVE Sensitive     GENTAMICIN <=1 SENSITIVE Sensitive     IMIPENEM <=0.25 SENSITIVE Sensitive     NITROFURANTOIN 32 SENSITIVE Sensitive     TRIMETH/SULFA <=20 SENSITIVE Sensitive     AMPICILLIN/SULBACTAM <=2 SENSITIVE Sensitive     PIP/TAZO <=4 SENSITIVE Sensitive ug/mL    * 50,000 COLONIES/mL ESCHERICHIA COLI   Aerobic/Anaerobic Culture w Gram Stain (surgical/deep wound)     Status: None (Preliminary result)   Collection Time: 11/17/22 11:06 AM   Specimen: Kidney; Urine  Result Value Ref Range Status   Specimen Description   Final    KIDNEY Performed at [facility], [address], [place], [place] [address]    Special Requests   Final    Normal Performed at [facility], [address] [place], [place] [address]    Gram Stain   Final    ABUNDANT WBC PRESENT, PREDOMINANTLY  PMN RARE GRAM NEGATIVE RODS Performed at [facility], [address], [place], [place] [address]    Culture   Final    FEW ESCHERICHIA COLI NO ANAEROBES ISOLATED; CULTURE IN PROGRESS FOR 5 DAYS    Report Status PENDING  Incomplete   Organism ID, Bacteria ESCHERICHIA COLI  Final      Susceptibility   Escherichia coli - MIC*    AMPICILLIN 4 SENSITIVE Sensitive     CEFEPIME <=0.12 SENSITIVE Sensitive     CEFTAZIDIME <=1 SENSITIVE Sensitive     CEFTRIAXONE <=0.25 SENSITIVE Sensitive     CIPROFLOXACIN <=0.25 SENSITIVE Sensitive     GENTAMICIN <=1 SENSITIVE Sensitive     IMIPENEM <=0.25 SENSITIVE Sensitive     TRIMETH/SULFA <=20 SENSITIVE Sensitive     AMPICILLIN/SULBACTAM <=2 SENSITIVE Sensitive     PIP/TAZO <=4 SENSITIVE Sensitive ug/mL    * FEW ESCHERICHIA COLI  Culture, blood (Routine X 2) w Reflex to ID Panel     Status: None (Preliminary result)   Collection Time: 11/18/22  9:15 AM   Specimen: BLOOD  Result Value Ref Range Status   Specimen Description   Final    BLOOD BLOOD LEFT ARM AEROBIC BOTTLE ONLY Performed at [facility], [address], [place], [place] [address]    Special Requests   Final    BOTTLES DRAWN AEROBIC ONLY Blood Culture results may not be optimal due to an inadequate volume of blood received in culture bottles Performed at [facility], [address] [place], [place] [address]    Culture   Final     NO GROWTH 2 DAYS Performed at [facility], [address], [place], [place] [address]    Report Status PENDING  Incomplete  Culture, blood (Routine X 2) w Reflex to ID Panel     Status: None (Preliminary result)   Collection Time: 11/18/22  9:15 AM   Specimen: BLOOD  Result Value Ref Range Status   Specimen Description   Final    BLOOD BLOOD RIGHT HAND AEROBIC BOTTLE ONLY Performed at [facility], [address] [place], [place] [address]    Special Requests   Final    BOTTLES DRAWN AEROBIC ONLY Blood Culture results may not be optimal due to an inadequate volume of blood received in culture bottles Performed at [facility], [address] [place], [place] [address]    Culture   Final    NO GROWTH 2 DAYS Performed at [facility], [address], [place], [place] [address]    Report Status PENDING  Incomplete     Radiology Studies: No results found.  Scheduled Meds:  amLODipine  5 mg Oral Daily   Chlorhexidine Gluconate Cloth  6 each Topical Daily   dexamethasone  4 mg Oral Daily   docusate sodium  200 mg Oral BID   enoxaparin (LOVENOX) injection  40 mg Subcutaneous Q24H   fentaNYL  1 patch Transdermal Q72H   fesoterodine  4 mg Oral Daily   [START ON 11/21/2022] furosemide  20 mg Oral Daily   pantoprazole  20 mg Oral Daily   senna  2 tablet Oral QHS   sodium chloride flush  5 mL Intracatheter Q8H   Continuous Infusions:  cefTRIAXone (ROCEPHIN)  IV 2 g (11/20/22 0914)     LOS: 5 days   Hughie Closs, MD [facility]  11/20/2022, 3:17 PM   *Please note that this is  a verbal dictation therefore any spelling or grammatical errors are due to the "Dragon [employer] One" system interpretation.  Please page via Amion and do not message via secure chat for urgent patient care matters. Secure chat can be used for non urgent patient care matters.  How to contact the [facility] Attending or Consulting provider 7A - 7P or  covering provider during after hours 7P -7A, for this patient?  Check the care team in [facility] and look for a) attending/consulting [facility] provider listed and b) the [facility] team listed. Page or secure chat 7A-7P. Log into [email or web address] and use [facility]'s universal password to access. If you do not have the password, please contact the hospital operator. Locate the [facility] provider you are looking for under [facility] and page to a number that you can be directly reached. If you still have difficulty reaching the provider, please page the [facility] (Director on Call) for the Hospitalists listed on amion for assistance.

## 2022-11-20 NOTE — Progress Notes (Signed)
Daily Progress Note   Patient Name: Laura [name]       Date: 11/20/2022 DOB: 07-26-1945  Age: 77 y.o. MRN#: [id number] Attending Physician: Hughie Closs, MD Primary Care Physician: Corwin Levins, MD Admit Date: 11/15/2022  Reason for Consultation/Follow-up: Establishing goals of care  Patient Profile/HPI:  77 y.o. female with past medical history of metastatic ovarian cancer with retroperitoneal mass encasing ureter, retroperitoneal adenopathy- s/p chemotherapy that was stopped due to intolerance, type 2 diabetes, GERD, and IBS who presented to the ED on 11/15/2022 with intractable pain. Urinalysis was concerning for infection. CT abdomen showed severe right hydronephrosis and removal of previously placed ureteral stent with significantly progressed R hydronephrosis as well. She was admitted with cancer-related pain secondary to metastatic ovarian cancer as well as obstructive uropathy with resultant AKI. Nephrostomy completed on 11/4 by IR- has had post procedure fever spikes and purulent drainage- cultures pending.   Palliative medicine consulted for "pain control".   Initial Palliative consult completed 11/3. Seen last on 11/5. At that time pain was well controlled and plan is to maximize hospitalization and d/c home with hospice.   Subjective: Received call from patient's daughter [name] requesting family meeting.  Met at bedside with patient and multiple family members.  Patient awake, alert, oriented. Reports pain well controlled.  Answered family questions related to hospice services.  Patient stated she would like to designate her son, Henri Medal as HCPOA. We reviewed HCPOA document purpose- she actually has a Living Will and HCPOA document completed at home, but it isn't notarized.   At close of discussion family asked for Hospice to reach out to Tillman Sers to answer more of their questions, they are hopeful for her to return home with hospice services.       Physical Exam Vitals and nursing note reviewed.  Constitutional:      General: She is not in acute distress. Cardiovascular:     Rate and Rhythm: Normal rate.  Pulmonary:     Effort: Pulmonary effort is normal.  Abdominal:     General: There is no distension.  Skin:    General: Skin is warm and dry.  Neurological:     Mental Status: She is alert. Mental status is at baseline.             Vital  Signs: BP (!) 150/79 (BP Location: Right Arm)   Pulse 83   Temp 97.8 F (36.6 C) (Axillary)   Resp 18   Ht 5\' 3"  (1.6 m)   Wt 82.1 kg   SpO2 99%   BMI 32.06 kg/m  SpO2: SpO2: 99 % O2 Device: O2 Device: Room Air O2 Flow Rate: O2 Flow Rate (L/min): 3 L/min  Intake/output summary:  Intake/Output Summary (Last 24 hours) at 11/20/2022 1623 Last data filed at 11/20/2022 1339 Gross per 24 hour  Intake 850 ml  Output 1150 ml  Net -300 ml   LBM: Last BM Date :  (PTA) Baseline Weight: Weight: 82.1 kg Most recent weight: Weight: 82.1 kg       Palliative Assessment/Data: PPS: 40%      Patient Active Problem List   Diagnosis Date Noted   Cancer-related breakthrough pain 11/15/2022   Primary cancer of ovary with widespread metastatic disease (HCC) 10/10/2022   Intractable abdominal pain 06/15/2022   Hydronephrosis of right kidney 06/15/2022   Constipation 06/07/2022   Stage 3b chronic kidney disease (CKD) (HCC) 02/14/2022   Swelling of lower leg 01/24/2022   Pain and swelling of left lower leg 11/09/2021   Chest pain 08/09/2021   DOE (dyspnea on exertion) 08/09/2021   Anxiety 08/09/2021   Paronychia of great toe of right foot 05/21/2021   COVID-19 virus infection 07/15/2020   Left leg pain 05/19/2020   Ovarian cancer (HCC) 01/03/2020   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/2020   Nontraumatic tear of right tibialis posterior tendon 12/14/2019   Right ankle pain 12/07/2019   Right ankle tendonitis 11/11/2019   Aortic atherosclerosis (HCC) 10/12/2019   Dehydration 10/06/2019   Frequent urination at night 08/26/2019   Port-A-Cath in place 07/21/2019   Genetic testing 07/15/2019   Elevated CA-125 07/07/2019   Family history of thyroid cancer    Malignant neoplasm of both ovaries 06/20/2019   Paronychia, finger, left 04/15/2019   Abdominal pain 04/04/2019   Retroperitoneal mass 04/03/2019   Acute gouty arthritis 12/17/2018   Bilateral hearing loss 12/21/2017   Eustachian tube disorder, bilateral 12/21/2017   Cough 02/23/2017   Left hand pain 05/26/2016   Primary osteoarthritis of both first carpometacarpal joints 05/26/2016   Skin avulsion 05/26/2016   Adhesive capsulitis of left shoulder 03/20/2016   Fatigue 12/16/2015   OAB (overactive bladder) 03/09/2015   Hearing loss, sensorineural, asymmetrical 11/09/2014   Vestibular hypofunction, left 11/09/2014   Obesity 07/25/2014   Diarrhea 05/02/2014   Dizziness 04/12/2014   Neuropathy    Blurred vision 02/15/2013   Eye pain 02/15/2013   Left shoulder pain 04/15/2012   Encounter for well adult exam with abnormal findings 10/22/2011   Cervical disc disease 10/22/2011   Bilateral hand pain 08/05/2011   SNHL (sensorineural hearing loss) 08/04/2011   Hypertension 07/01/2011   GERD (gastroesophageal reflux disease) 06/29/2011   Bloating 06/29/2011   Back pain 03/13/2011   Nystagmus 01/04/2011   Insomnia 10/21/2010   Other constipation 08/17/2009   Vitamin D deficiency 05/15/2009   Irritable bowel syndrome with constipation 01/16/2009   Primary osteoarthritis of both hands 12/15/2008   Diabetes mellitus type 2, noninsulin dependent (HCC) 11/03/2008   Hyperlipidemia 11/03/2008   OVERACTIVE BLADDER 11/03/2008   OSA (obstructive sleep apnea) 11/03/2008   MURMUR 11/03/2008    Overactive bladder 11/03/2008    Palliative Care Assessment & Plan    Assessment/Recommendations/Plan  Continue po hydromorphone to 2mg  po q2hr prn Continue fentanyl patch  Constipation resolved She has been referred for hospice services at home- I reached out to hospice liaison and requested they call Tillman Sers  Code Status: DNR  Prognosis:  < 6 months  Discharge Planning: To Be Determined  Care plan was discussed with patient and daughter.  Thank you for allowing the Palliative Medicine Team to assist in the care of this patient.  Total time:  65 minutes Prolonged billing:  Time includes:   Preparing to see the patient (e.g., review of tests) Obtaining and/or reviewing separately obtained history Performing a medically necessary appropriate examination and/or evaluation Counseling and educating the patient/family/caregiver Ordering medications, tests, or procedures Referring and communicating with other health care professionals (when not reported separately) Documenting clinical information in the electronic or other health record Independently interpreting results (not reported separately) and communicating results to the patient/family/caregiver Care coordination (not reported separately) Clinical documentation  Ocie Bob, AGNP-C Palliative Medicine   Please contact Palliative Medicine Team phone at [phone] for questions and concerns.

## 2022-11-21 DIAGNOSIS — N179 Acute kidney failure, unspecified: Secondary | ICD-10-CM | POA: Diagnosis present

## 2022-11-21 DIAGNOSIS — A419 Sepsis, unspecified organism: Secondary | ICD-10-CM | POA: Diagnosis present

## 2022-11-21 DIAGNOSIS — G893 Neoplasm related pain (acute) (chronic): Secondary | ICD-10-CM | POA: Diagnosis not present

## 2022-11-21 DIAGNOSIS — Z515 Encounter for palliative care: Secondary | ICD-10-CM

## 2022-11-21 LAB — BASIC METABOLIC PANEL
Anion gap: 9 (ref 5–15)
BUN: 18 mg/dL (ref 8–23)
CO2: 22 mmol/L (ref 22–32)
Calcium: 9.4 mg/dL (ref 8.9–10.3)
Chloride: 99 mmol/L (ref 98–111)
Creatinine, Ser: 1.2 mg/dL — ABNORMAL HIGH (ref 0.44–1.00)
GFR, Estimated: 46 mL/min — ABNORMAL LOW (ref >60)
Glucose, Bld: 137 mg/dL — ABNORMAL HIGH (ref 70–99)
Potassium: 3.6 mmol/L (ref 3.5–5.1)
Sodium: 130 mmol/L — ABNORMAL LOW (ref 135–145)

## 2022-11-21 LAB — CBC WITH DIFFERENTIAL/PLATELET
Abs Immature Granulocytes: 0.56 10*3/uL — ABNORMAL HIGH (ref 0.00–0.07)
Basophils Absolute: 0 10*3/uL (ref 0.0–0.1)
Basophils Relative: 0 %
Eosinophils Absolute: 0 10*3/uL (ref 0.0–0.5)
Eosinophils Relative: 0 %
HCT: 28 % — ABNORMAL LOW (ref 36.0–46.0)
Hemoglobin: 9.6 g/dL — ABNORMAL LOW (ref 12.0–15.0)
Immature Granulocytes: 5 %
Lymphocytes Relative: 8 %
Lymphs Abs: 1 10*3/uL (ref 0.7–4.0)
MCH: 31.4 pg (ref 26.0–34.0)
MCHC: 34.3 g/dL (ref 30.0–36.0)
MCV: 91.5 fL (ref 80.0–100.0)
Monocytes Absolute: 0.9 10*3/uL (ref 0.1–1.0)
Monocytes Relative: 7 %
Neutro Abs: 10 10*3/uL — ABNORMAL HIGH (ref 1.7–7.7)
Neutrophils Relative %: 80 %
Platelets: 232 10*3/uL (ref 150–400)
RBC: 3.06 MIL/uL — ABNORMAL LOW (ref 3.87–5.11)
RDW: 16.2 % — ABNORMAL HIGH (ref 11.5–15.5)
WBC: 12.5 10*3/uL — ABNORMAL HIGH (ref 4.0–10.5)
nRBC: 0 % (ref 0.0–0.2)

## 2022-11-21 MED ORDER — HYDROMORPHONE HCL 1 MG/ML IJ SOLN
1.0000 mg | Freq: Once | INTRAMUSCULAR | Status: AC
  Administered 2022-11-21: 1 mg via INTRAVENOUS

## 2022-11-21 MED ORDER — CEFADROXIL 500 MG PO CAPS
500.0000 mg | ORAL_CAPSULE | Freq: Two times a day (BID) | ORAL | Status: DC
  Administered 2022-11-21: 500 mg via ORAL
  Filled 2022-11-21 (×2): qty 1

## 2022-11-21 MED ORDER — DEXAMETHASONE 4 MG PO TABS: 2.0000 mg | ORAL_TABLET | Freq: Two times a day (BID) | ORAL | Status: DC

## 2022-11-21 MED ORDER — ACETAMINOPHEN 325 MG PO TABS
650.0000 mg | ORAL_TABLET | Freq: Three times a day (TID) | ORAL | Status: DC
  Administered 2022-11-21: 650 mg via ORAL
  Filled 2022-11-21: qty 2

## 2022-11-21 MED ORDER — CEFADROXIL 500 MG PO CAPS: 500.0000 mg | ORAL_CAPSULE | Freq: Two times a day (BID) | ORAL | 0 refills | Status: AC

## 2022-11-21 MED ORDER — BISACODYL 10 MG RE SUPP: 10.0000 mg | Freq: Every day | RECTAL | Status: DC | PRN

## 2022-11-21 MED ORDER — FENTANYL 25 MCG/HR TD PT72
1.0000 | MEDICATED_PATCH | TRANSDERMAL | Status: DC
  Administered 2022-11-21: 1 via TRANSDERMAL
  Filled 2022-11-21: qty 1

## 2022-11-21 MED ORDER — AMLODIPINE BESYLATE 10 MG PO TABS
10.0000 mg | ORAL_TABLET | Freq: Every day | ORAL | Status: DC
  Administered 2022-11-21: 10 mg via ORAL
  Filled 2022-11-21: qty 1

## 2022-11-21 MED ORDER — HYDROMORPHONE HCL 1 MG/ML IJ SOLN
1.0000 mg | INTRAMUSCULAR | Status: DC | PRN
  Administered 2022-11-21: 1 mg via INTRAVENOUS

## 2022-11-21 MED ORDER — HYDROMORPHONE HCL 1 MG/ML IJ SOLN
1.0000 mg | INTRAMUSCULAR | Status: DC | PRN
  Administered 2022-11-21: 1 mg via INTRAVENOUS
  Filled 2022-11-21: qty 1

## 2022-11-21 NOTE — Progress Notes (Signed)
Chaplain met with Laura [name]'s granddaughter who stated that she wants to name her son as HCPOA.  Given that she is going to [employer] this evening, HCPOA paperwork is not needed at this time because she was able to make her own decision to transfer there.    Chaplain [employer], Bcc

## 2022-11-21 NOTE — Progress Notes (Addendum)
Daily Progress Note   Patient Name: Laura [name]       Date: 11/21/2022 DOB: 04-08-1945  Age: 77 y.o. MRN#: [id number] Attending Physician: Hughie Closs, MD Primary Care Physician: Corwin Levins, MD Admit Date: 11/15/2022  Reason for Consultation/Follow-up: Establishing goals of care  Patient Profile/HPI:  77 y.o. female with past medical history of metastatic ovarian cancer with retroperitoneal mass encasing ureter, retroperitoneal adenopathy- s/p chemotherapy that was stopped due to intolerance, type 2 diabetes, GERD, and IBS who presented to the ED on 11/15/2022 with intractable pain. Urinalysis was concerning for infection. CT abdomen showed severe right hydronephrosis and removal of previously placed ureteral stent with significantly progressed R hydronephrosis as well. She was admitted with cancer-related pain secondary to metastatic ovarian cancer as well as obstructive uropathy with resultant AKI. Nephrostomy completed on 11/4 by IR- has had post procedure fever spikes and purulent drainage- cultures pending.   Palliative medicine consulted for "pain control".   Initial Palliative consult completed 11/3. Seen last on 11/5. At that time pain was well controlled and plan is to maximize hospitalization and d/c home with hospice.   Subjective:  Chart reviewed including labs, progress notes, imaging from this and previous encounters.  Per report from RN patient had difficulty with pain control last night. Patient is oriented but very forgetful. She had questions about the [facility] document that we discussed yesterday. She has a completed document that was brought from home, however it is not notarized. Unfortunately she may need to complete a new document as she had already signed this document  prior to it being notarized. Spiritual care has been consulted for assistance. Patient utilized 3mg  IV dilaudid and 8mg  po dilaudid for morphine po equal analgesic dose of 77.5mg . Patient noted that when she receives the breakthrough medication it works- it just doesn't last long enough. She also has pain with any type of movement.  She describes the pain as in her lower abdomen that reaches to her back and down her leg at times. It has characteristics sometimes similar to menstrual cramps, sometimes burning and aching.  We discussed possibly adding gabapentin in the past- but she has tried that and after two doses of 100mg  she had onset of significant confusion.  Will increase her fentanyl patch to every 3 days. Start acetaminophen 650mg   TID.  She requires less prn dosing during the day- but seems to worsen at night.  Will divide dexamethasone dose to 2mg  BID to lengthen effect of steroid antiinflammatory.  With patient's permission I discussed patient's diagnosis and prognosis with her Granddaughter Monica Martinez who is at bedside.  Due to her need for pain management she may be eligible for inpatient hospice for continued symptom control. I spoke with Tillman Sers regarding this option - Wandalee Ferdinand is going to speak with Jeannett Senior who is patient's preferred Management consultant.    Physical Exam Vitals and nursing note reviewed.  Constitutional:      General: She is not in acute distress. Cardiovascular:     Rate and Rhythm: Normal rate.  Pulmonary:     Effort: Pulmonary effort is normal.  Abdominal:     General: There is no distension.  Skin:    General: Skin is warm and dry.  Neurological:     Mental Status: She is alert. Mental status is at baseline.             Vital Signs: BP (!) 177/88 (BP Location: Right Arm)   Pulse 87   Temp 98.2 F (36.8 C) (Oral)   Resp 16   Ht 5\' 3"  (1.6 m)   Wt 82.1 kg   SpO2 98%   BMI 32.06 kg/m  SpO2: SpO2: 98 % O2 Device: O2 Device: Room Air O2 Flow Rate:  O2 Flow Rate (L/min): 3 L/min  Intake/output summary:  Intake/Output Summary (Last 24 hours) at 11/21/2022 1235 Last data filed at 11/21/2022 0957 Gross per 24 hour  Intake 905 ml  Output 1400 ml  Net -495 ml   LBM: Last BM Date : 11/18/22 Baseline Weight: Weight: 82.1 kg Most recent weight: Weight: 82.1 kg       Palliative Assessment/Data: PPS: 40%      Patient Active Problem List   Diagnosis Date Noted   Cancer-related breakthrough pain 11/15/2022   Primary cancer of ovary with widespread metastatic disease (HCC) 10/10/2022   Intractable abdominal pain 06/15/2022   Hydronephrosis of right kidney 06/15/2022   Constipation 06/07/2022   Stage 3b chronic kidney disease (CKD) (HCC) 02/14/2022   Swelling of lower leg 01/24/2022   Pain and swelling of left lower leg 11/09/2021   Chest pain 08/09/2021   DOE (dyspnea on exertion) 08/09/2021   Anxiety 08/09/2021   Paronychia of great toe of right foot 05/21/2021   COVID-19 virus infection 07/15/2020   Left leg pain 05/19/2020   Ovarian cancer (HCC) 01/03/2020   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/2020   Nontraumatic tear of right tibialis posterior tendon 12/14/2019   Right ankle pain 12/07/2019   Right ankle tendonitis 11/11/2019   Aortic atherosclerosis (HCC) 10/12/2019   Dehydration 10/06/2019   Frequent urination at night 08/26/2019   Port-A-Cath in place 07/21/2019   Genetic testing 07/15/2019   Elevated CA-125 07/07/2019   Family history of thyroid cancer    Malignant neoplasm of both ovaries 06/20/2019   Paronychia, finger, left 04/15/2019   Abdominal pain 04/04/2019   Retroperitoneal mass 04/03/2019   Acute gouty arthritis 12/17/2018   Bilateral hearing loss 12/21/2017   Eustachian tube disorder, bilateral 12/21/2017   Cough 02/23/2017   Left hand pain 05/26/2016   Primary osteoarthritis of both first carpometacarpal joints 05/26/2016   Skin avulsion 05/26/2016   Adhesive  capsulitis of left shoulder 03/20/2016   Fatigue 12/16/2015   OAB (overactive bladder) 03/09/2015   Hearing loss, sensorineural,  asymmetrical 11/09/2014   Vestibular hypofunction, left 11/09/2014   Obesity 07/25/2014   Diarrhea 05/02/2014   Dizziness 04/12/2014   Neuropathy    Blurred vision 02/15/2013   Eye pain 02/15/2013   Left shoulder pain 04/15/2012   Encounter for well adult exam with abnormal findings 10/22/2011   Cervical disc disease 10/22/2011   Bilateral hand pain 08/05/2011   SNHL (sensorineural hearing loss) 08/04/2011   Hypertension 07/01/2011   GERD (gastroesophageal reflux disease) 06/29/2011   Bloating 06/29/2011   Back pain 03/13/2011   Nystagmus 01/04/2011   Insomnia 10/21/2010   Other constipation 08/17/2009   Vitamin D deficiency 05/15/2009   Irritable bowel syndrome with constipation 01/16/2009   Primary osteoarthritis of both hands 12/15/2008   Diabetes mellitus type 2, noninsulin dependent (HCC) 11/03/2008   Hyperlipidemia 11/03/2008   OVERACTIVE BLADDER 11/03/2008   OSA (obstructive sleep apnea) 11/03/2008   MURMUR 11/03/2008   Overactive bladder 11/03/2008    Palliative Care Assessment & Plan    Assessment/Recommendations/Plan  Continue po hydromorphone to 2mg  po q2hr prn Increase fentanyl patch to 34mcg/hr- change patch q3 days Constipation resolved She has been referred for hospice services- considering inpatient placement for symptom management Start acetaminophen 650mg  TID Change dexamethasone to 2mg  po BID (morning and afternoon)  Addendum- Discussed with [facility]- they are able to offer patient a bed today- I spoke with Shelita by phone, then met again with patient, her son, and granddaughter- after much discussion patient and family would like to proceed today to [facility] for continued pain management.   Code Status: DNR  Prognosis:  < 6 months  Discharge Planning: To Be Determined  Care plan was discussed  with patient and daughter.  Thank you for allowing the Palliative Medicine Team to assist in the care of this patient.  Total time: 80 minutes Prolonged billing:  Time includes:   Preparing to see the patient (e.g., review of tests) Obtaining and/or reviewing separately obtained history Performing a medically necessary appropriate examination and/or evaluation Counseling and educating the patient/family/caregiver Ordering medications, tests, or procedures Referring and communicating with other health care professionals (when not reported separately) Documenting clinical information in the electronic or other health record Independently interpreting results (not reported separately) and communicating results to the patient/family/caregiver Care coordination (not reported separately) Clinical documentation  Ocie Bob, AGNP-C Palliative Medicine   Please contact Palliative Medicine Team phone at [phone] for questions and concerns.

## 2022-11-21 NOTE — Progress Notes (Signed)
Called report to [facility] RN, Pam. Discharge instructions given and provided. Patient has signed all consents and family at beside is aware. Porta cath saline locked. [facility] called for transport set up.

## 2022-11-21 NOTE — Discharge Summary (Signed)
Physician Discharge Summary  Laura [name] QIH:[id number] DOB: May 31, 1945 DOA: 11/15/2022  PCP: Corwin Levins, MD  Admit date: 11/15/2022 Discharge date: 11/21/2022 30 Day Unplanned Readmission Risk Score    Flowsheet Row ED to Hosp-Admission (Current) from 11/15/2022 in [facility]  30 Day Unplanned Readmission Risk Score (%) 38.23 Filed at 11/21/2022 1200       This score is the patient's risk of an unplanned readmission within 30 days of being discharged (0 -100%). The score is based on dignosis, age, lab data, medications, orders, and past utilization.   Low:  0-14.9   Medium: 15-21.9   High: 22-29.9   Extreme: 30 and above          Admitted From: Home Disposition: [facility]  Discharge Condition: Stable with poor prognosis CODE STATUS: DNR Diet recommendation: Diabetic  Subjective: Seen and examined this morning.  Granddaughter at the bedside.  Patient was concerned that her pain is still at times not controlled.  No other question or concern.  Brief/Interim Summary:  Laura [name] is a 77 y.o. female with medical history significant of ovarian cancer, type 2 diabetes, GERD, IBS who presented to emergency department due to intractable pain.  Patient was seen by primary care on 10/31.  She was endorsing refractory pain to her home Dilaudid and was given a Toradol injection.  Patient presented to the ER due to refractory symptoms.  Regarding her cancer she has widespread disease with peritoneal metastasis.  She last saw her oncologist on 10/17.  She is undergoing chemotherapy and radiation and has been complicated by right hydronephrosis requiring stent placement.   In the ER lab were obtained which showed sodium 127, creatinine 1.9 baseline 1.3, WBC 12.3, hemoglobin 10.3, platelets 206, urinalysis concerning for infection.  Patient underwent CT abdomen which showed removal of previous placed ureteral stent with severe right hydronephrosis.  Urology was consulted in  the emergency department and patient was admitted for further workup.  On admission she was given ceftriaxone for UTI.  Admitted under hospital service.  Details below.   AKI on CKD stage IIIa/right hydronephrosis/UTI: UA consistent with UTI.  Patient has history of right hydronephrosis, previously had ureteral stent, urology tried to replace but this as outpatient was unsuccessful, she opted to give herself trial of without stent, comes in with right-sided flank pain, secondary to right hydronephrosis.  Has been started on Rocephin, follow culture.  Seen by urology, IR consulted, patient underwent percutaneous nephrostomy placed on the afternoon of 11/17/2022 by IR.  She developed fever postprocedure and reportedly she had purulent drainage upon placement.  Last temperature spike was 102.7 at 9:36 PM on 11/18/2022.  Cultures from the drainage is growing E. coli which was pansensitive, patient remained on Rocephin and was switched to cefadroxil today.  She will be discharged on 5 more days of cefadroxil.   Sepsis secondary to UTI, not POA: Patient developed fever with Tmax of 101.8 along with tachycardia postprocedure/percutaneous nephrostomy on the afternoon of 11/17/2022 meeting criteria for sepsis.  Management as above.    Hypokalemia: Resolved.   History of metastatic ovarian carcinoma with cancer related pain/goal of care: Pain was not well-controlled on current medications.  Discussed with palliative care.  They had a meeting with family and patient yesterday and family chose to take patient home with hospice however due to uncontrolled pain, family as well as patient both were not comfortable discharging today.  I informed palliative care who had multiple discussions with  several family members today and they were given the option to discharge the patient to [facility] for better pain control and eventually family agreed with that plan and patient is going to be discharged to [facility] today.    GERD: Continue Pepcid.   Hypertension: Blood pressure fairly controlled.  Continue home amlodipine.   Prediabetes: Hemoglobin A1c 5.9 just 3 weeks ago, she is on diet control.  Blood sugar very well-controlled.  Does not need SSI.   Obstructive sleep apnea: Patient does not use CPAP.   Insomnia: Continue trazodone.   Bilateral lower extremity edema: Resume Lasix.    Discharge plan was discussed with patient and/or family member and they verbalized understanding and agreed with it.  Discharge Diagnoses:  Principal Problem:   Cancer-related breakthrough pain Active Problems:   GERD (gastroesophageal reflux disease)   Diabetes mellitus type 2, noninsulin dependent (HCC)   Hyperlipidemia   Hydronephrosis of right kidney   Acute renal failure superimposed on stage 3a chronic kidney disease (HCC)   Sepsis secondary to UTI [facility])   Hospice care patient    Discharge Instructions   Allergies as of 11/21/2022       Reactions   Ciprofloxacin Swelling   Torn tendon   Hydrocodone Bit-homatrop Mbr Other (See Comments)   Vertigo *pt strongly prefers to never take* took 3 years to recover from   Prednisone Anxiety   *pt strongly prefers to never be given prednisone*   Sulfa Antibiotics Hives, Itching, Swelling   Tongue swells   Alfuzosin Other (See Comments)   Weakness and fatigue   Codeine Itching   Crestor [rosuvastatin Calcium] Other (See Comments)   Did something to memory    Doxycycline Other (See Comments)   Severe rectal Gas.   Gabapentin Other (See Comments)   disoriented   Keflex [cephalexin] Diarrhea, Nausea And Vomiting   Myrbetriq [mirabegron Er] Swelling   Swelling of legs and feet   Naproxen Other (See Comments)   Stomach cramps/loud gas   Statins Other (See Comments)   Muscle weakness        Medication List     TAKE these medications    acetaminophen 500 MG tablet Commonly known as: TYLENOL Take 1 tablet (500 mg total) by mouth every 6 (six) hours  as needed. What changed: reasons to take this   amLODipine 10 MG tablet Commonly known as: NORVASC Take 1 tablet (10 mg total) by mouth daily. What changed: how much to take   bismuth subsalicylate 262 MG/15ML suspension Commonly known as: PEPTO BISMOL Take 30 mLs by mouth every 6 (six) hours as needed for indigestion or diarrhea or loose stools.   cefadroxil 500 MG capsule Commonly known as: DURICEF Take 1 capsule (500 mg total) by mouth 2 (two) times daily for 5 days.   docusate sodium 100 MG capsule Commonly known as: COLACE Take 2 capsules (200 mg total) by mouth 2 (two) times daily. What changed:  how much to take when to take this reasons to take this   famotidine-calcium carbonate-magnesium hydroxide 10-800-165 MG chewable tablet Commonly known as: PEPCID COMPLETE Chew 1 tablet by mouth daily.   furosemide 20 MG tablet Commonly known as: LASIX TAKE 1 TABLET (20MG ) BY MOUTH DAILYAS NEEDED FOR EDEMA (SWELLING) What changed: See the new instructions.   glimepiride 2 MG tablet Commonly known as: AMARYL TAKE 1/2 TABLET (1MG ) BY MOUTH ONCE DAILY BEFORE BREAKFAST   HYDROmorphone 4 MG tablet Commonly known as: DILAUDID Take 4 mg by mouth  every 4 (four) hours as needed for severe pain (PRN as needed.).   lidocaine 5 % Commonly known as: LIDODERM Place 1 patch onto the skin daily. Remove & Discard patch within 12 hours or as directed by MD What changed:  how much to take when to take this reasons to take this   loperamide 2 MG tablet Commonly known as: IMODIUM A-D Take 2-6 mg by mouth as needed for diarrhea or loose stools.   loratadine 10 MG tablet Commonly known as: CLARITIN Take 10 mg by mouth daily.   megestrol 40 MG/ML suspension Commonly known as: MEGACE Take by mouth daily.   meloxicam 7.5 MG tablet Commonly known as: MOBIC Take 7.5 mg by mouth daily.   ONE TOUCH ULTRA 2 w/Device Kit Use as directed   OneTouch Ultra test strip Generic drug:  glucose blood USE TO CHECK BLOOD SUGARS TWO TIMES DAILY   onetouch ultrasoft lancets Use as directed. (1 each by Other route as needed for other. Use as instructed)   prochlorperazine 10 MG tablet Commonly known as: COMPAZINE Take 1 tablet (10 mg total) by mouth every 6 (six) hours as needed for nausea or vomiting.   solifenacin 5 MG tablet Commonly known as: VESICARE Take 1 tablet (5 mg total) by mouth daily.   traMADol 50 MG tablet Commonly known as: ULTRAM Take 1 every 6 hours for pain not relieved by Tylenol alone What changed:  how much to take how to take this when to take this reasons to take this additional instructions   traZODone 100 MG tablet Commonly known as: DESYREL 1/2 - 1 tab by mouth at bedtime for sleep as needed What changed:  how much to take how to take this when to take this reasons to take this additional instructions        Follow-up Information     Corwin Levins, MD Follow up in 1 week(s).   Specialties: Internal Medicine, Radiology Contact information: [address] [place] [address] [phone]                Allergies  Allergen Reactions   Ciprofloxacin Swelling    Torn tendon   Hydrocodone Bit-Homatrop Mbr Other (See Comments)    Vertigo *pt strongly prefers to never take* took 3 years to recover from   Prednisone Anxiety    *pt strongly prefers to never be given prednisone*    Sulfa Antibiotics Hives, Itching and Swelling    Tongue swells   Alfuzosin Other (See Comments)    Weakness and fatigue   Codeine Itching   Crestor [Rosuvastatin Calcium] Other (See Comments)    Did something to memory     Doxycycline Other (See Comments)    Severe rectal Gas.   Gabapentin Other (See Comments)    disoriented   Keflex [Cephalexin] Diarrhea and Nausea And Vomiting   Myrbetriq [facility]] Swelling    Swelling of legs and feet   Naproxen Other (See Comments)    Stomach cramps/loud gas   Statins Other (See  Comments)    Muscle weakness    Consultations: IR, urology and palliative care   Procedures/Studies: IR NEPHROSTOMY PLACEMENT RIGHT  Result Date: 11/17/2022 CLINICAL DATA:  Right renal obstruction with clinical evidence of infection and need for percutaneous nephrostomy tube placement. EXAM: 1. ULTRASOUND GUIDANCE FOR PUNCTURE OF THE RIGHT RENAL COLLECTING SYSTEM. 2. RIGHT PERCUTANEOUS NEPHROSTOMY TUBE PLACEMENT. COMPARISON:  None Available. ANESTHESIA/SEDATION: Moderate (conscious) sedation was employed during this procedure. A total of  Versed 4.0 mg and Fentanyl 200 mcg was administered intravenously by radiology nursing. Moderate Sedation Time: 14 minutes. The patient's level of consciousness and vital signs were monitored continuously by radiology nursing throughout the procedure under my direct supervision. CONTRAST:  15 mL Omnipaque 300 MEDICATIONS: None additional FLUOROSCOPY TIME:  26 seconds.  12.0 mGy. PROCEDURE: The procedure, risks, benefits, and alternatives were explained to the patient. Questions regarding the procedure were encouraged and answered. The patient understands and consents to the procedure. A time-out was performed prior to initiating the procedure. The right flank region was prepped with chlorhexidine in a sterile fashion, and a sterile drape was applied covering the operative field. A sterile gown and sterile gloves were used for the procedure. Local anesthesia was provided with 1% Lidocaine. Ultrasound was used to localize the right kidney. Under direct ultrasound guidance, an 18 gauge trocar needle was advanced into the renal collecting system. Ultrasound image documentation was performed. Aspiration of urine sample was performed followed by contrast injection. Aspirated urine sample was sent for culture analysis. Percutaneous tract dilatation was then performed over the guidewire. A 10-French percutaneous nephrostomy tube was then advanced and formed in the collecting  system. Catheter position was confirmed by fluoroscopy after contrast injection. The catheter was secured at the skin with a Prolene retention suture and Stat-Lock device. A gravity bag was placed. COMPLICATIONS: None. FINDINGS: Ultrasound demonstrates severe hydronephrosis of the right kidney. After access of the upper pole collecting system, aspiration yielded grossly purulent fluid consistent with pyonephrosis. A 20 mL sample of purulent fluid was sent for culture analysis. The nephrostomy tube was formed at the level of the renal pelvis. There is return of purulent fluid with gravity bag drainage. IMPRESSION: High-grade obstruction of the right kidney with return of grossly purulent fluid consistent with pyonephrosis. A fluid sample was sent for culture analysis. A 10 French nephrostomy tube was placed via upper pole access and was placed to gravity bag drainage. Electronically Signed   By: Irish Lack M.D.   On: 11/17/2022 11:24   CT ABDOMEN WO CONTRAST  Result Date: 11/15/2022 CLINICAL DATA:  Flank pain. History of ovarian cancer and chemotherapy. EXAM: CT ABDOMEN WITHOUT CONTRAST TECHNIQUE: Multidetector CT imaging of the abdomen was performed following the standard protocol without IV contrast. RADIATION DOSE REDUCTION: This exam was performed according to the departmental dose-optimization program which includes automated exposure control, adjustment of the mA and/or kV according to patient size and/or use of iterative reconstruction technique. COMPARISON:  CT abdomen pelvis dated 08/25/2022. FINDINGS: Evaluation of this exam is limited in the absence of intravenous contrast. Lower chest: There are bibasilar linear atelectasis/scarring. Trace bilateral pleural effusions suspected. There is 3 vessel coronary vascular calcification. No intra-abdominal free air. Hepatobiliary: The liver is unremarkable. No biliary ductal dilatation. Cholecystectomy. No retained calcified stone noted in the central CBD.  Pancreas: Unremarkable. No pancreatic ductal dilatation or surrounding inflammatory changes. Spleen: Normal in size without focal abnormality. Adrenals/Urinary Tract: The adrenal glands are unremarkable. There is mild left hydronephrosis similar to prior CT. No stone identified in the left kidney or the visualized left ureter. There is severe right hydronephrosis, significantly progressed since the prior CT. Interval removal of the previously seen right ureteral stent. No stone identified. Stomach/Bowel: There is a small hiatal hernia. Dense stool noted throughout the colon. No bowel dilatation in the visualized abdomen. Vascular/Lymphatic: Moderate aortoiliac atherosclerotic disease. Retroperitoneal adenopathy encasing the distal abdominal aorta. There is a 5.6 x 4.7 cm mass inferior to the  right renal hilum contiguous with the right paraspinal/retroperitoneal mass. Overall similar or slightly increased since the prior CT. Other: None Musculoskeletal: Osteopenia with degenerative changes. No acute osseous pathology. IMPRESSION: 1. Interval removal of the previously seen right ureteral stent with development of severe right hydronephrosis. 2. Retroperitoneal adenopathy similar or slightly progressed since the prior CT. 3. Mild left hydronephrosis similar to prior CT. 4. Aortic Atherosclerosis (ICD10-I70.0). Electronically Signed   By: Elgie Collard M.D.   On: 11/15/2022 15:36   DG Lumbar Spine Complete  Result Date: 11/12/2022 CLINICAL DATA:  Low back pain.  Right hip/leg pain. EXAM: LUMBAR SPINE - COMPLETE 4+ VIEW COMPARISON:  None Available. FINDINGS: There are 5 nonrib-bearing lumbar vertebrae. Anatomic lumbar curvature. No spondylolysis. There is grade 1 anterolisthesis of L4 over L5, similar to the prior study. Vertebral body heights are maintained. No aggressive osseous lesion. Mild-moderate multilevel degenerative changes in the form of reduced intervertebral disc height, endplate  sclerosis/irregularity, facet arthropathy and marginal osteophyte formation. Sacroiliac joints are symmetric. Visualized soft tissues are within normal limits. There are surgical clips in the right upper quadrant, typical of a previous cholecystectomy. IMPRESSION: *No acute osseous abnormality of the lumbar spine. *Mild-to-moderate multilevel degenerative changes, as described above. Electronically Signed   By: Jules Schick M.D.   On: 11/12/2022 08:44     Discharge Exam: Vitals:   11/21/22 0511 11/21/22 1318  BP: (!) 177/88 (!) 174/84  Pulse: 87 91  Resp: 16 16  Temp: 98.2 F (36.8 C) (!) 97.3 F (36.3 C)  SpO2: 98% 96%   Vitals:   11/20/22 1339 11/20/22 2026 11/21/22 0511 11/21/22 1318  BP: (!) 150/79 (!) 150/76 (!) 177/88 (!) 174/84  Pulse: 83 88 87 91  Resp: 18 16 16 16   Temp: 97.8 F (36.6 C) 97.9 F (36.6 C) 98.2 F (36.8 C) (!) 97.3 F (36.3 C)  TempSrc: Axillary Oral Oral Oral  SpO2: 99% 99% 98% 96%  Weight:      Height:        General: Pt is alert, awake, not in acute distress Cardiovascular: RRR, S1/S2 +, no rubs, no gallops Respiratory: CTA bilaterally, no wheezing, no rhonchi Abdominal: Soft, NT, ND, bowel sounds + Extremities: +1-2 pitting edema bilateral lower extremity, no cyanosis    The results of significant diagnostics from this hospitalization (including imaging, microbiology, ancillary and laboratory) are listed below for reference.     Microbiology: Recent Results (from the past 240 hour(s))  Urine Culture     Status: Abnormal   Collection Time: 11/15/22 12:40 PM   Specimen: Urine, Clean Catch  Result Value Ref Range Status   Specimen Description   Final    URINE, CLEAN CATCH Performed at [facility], [address], [place], [place] [address]    Special Requests   Final    NONE Performed at [facility], [address], [place], [place] [address]    Culture 50,000 COLONIES/mL ESCHERICHIA COLI (A)   Final   Report Status 11/18/2022 FINAL  Final   Organism ID, Bacteria ESCHERICHIA COLI (A)  Final      Susceptibility   Escherichia coli - MIC*    AMPICILLIN 4 SENSITIVE Sensitive     CEFAZOLIN <=4 SENSITIVE Sensitive     CEFEPIME <=0.12 SENSITIVE Sensitive     CEFTRIAXONE <=0.25 SENSITIVE Sensitive     CIPROFLOXACIN <=0.25 SENSITIVE Sensitive     GENTAMICIN <=1 SENSITIVE Sensitive     IMIPENEM <=0.25 SENSITIVE Sensitive  NITROFURANTOIN 32 SENSITIVE Sensitive     TRIMETH/SULFA <=20 SENSITIVE Sensitive     AMPICILLIN/SULBACTAM <=2 SENSITIVE Sensitive     PIP/TAZO <=4 SENSITIVE Sensitive ug/mL    * 50,000 COLONIES/mL ESCHERICHIA COLI  Aerobic/Anaerobic Culture w Gram Stain (surgical/deep wound)     Status: None (Preliminary result)   Collection Time: 11/17/22 11:06 AM   Specimen: Kidney; Urine  Result Value Ref Range Status   Specimen Description   Final    KIDNEY Performed at [facility], [address], [place], [place] [address]    Special Requests   Final    Normal Performed at [facility], [address], [place], [place] [address]    Gram Stain   Final    ABUNDANT WBC PRESENT, PREDOMINANTLY PMN RARE GRAM NEGATIVE RODS Performed at [facility], [address], [place], [place] [address]    Culture   Final    FEW ESCHERICHIA COLI NO ANAEROBES ISOLATED; CULTURE IN PROGRESS FOR 5 DAYS    Report Status PENDING  Incomplete   Organism ID, Bacteria ESCHERICHIA COLI  Final      Susceptibility   Escherichia coli - MIC*    AMPICILLIN 4 SENSITIVE Sensitive     CEFEPIME <=0.12 SENSITIVE Sensitive     CEFTAZIDIME <=1 SENSITIVE Sensitive     CEFTRIAXONE <=0.25 SENSITIVE Sensitive     CIPROFLOXACIN <=0.25 SENSITIVE Sensitive     GENTAMICIN <=1 SENSITIVE Sensitive     IMIPENEM <=0.25 SENSITIVE Sensitive     TRIMETH/SULFA <=20 SENSITIVE Sensitive     AMPICILLIN/SULBACTAM <=2 SENSITIVE Sensitive     PIP/TAZO <=4 SENSITIVE Sensitive  ug/mL    * FEW ESCHERICHIA COLI  Culture, blood (Routine X 2) w Reflex to ID Panel     Status: None (Preliminary result)   Collection Time: 11/18/22  9:15 AM   Specimen: BLOOD  Result Value Ref Range Status   Specimen Description   Final    BLOOD BLOOD LEFT ARM AEROBIC BOTTLE ONLY Performed at [facility], [address] [place], [place] [address]    Special Requests   Final    BOTTLES DRAWN AEROBIC ONLY Blood Culture results may not be optimal due to an inadequate volume of blood received in culture bottles Performed at [facility], [address], [place], [place] [address]    Culture   Final    NO GROWTH 3 DAYS Performed at [facility], [address] [place], [place] [address]    Report Status PENDING  Incomplete  Culture, blood (Routine X 2) w Reflex to ID Panel     Status: None (Preliminary result)   Collection Time: 11/18/22  9:15 AM   Specimen: BLOOD  Result Value Ref Range Status   Specimen Description   Final    BLOOD BLOOD RIGHT HAND AEROBIC BOTTLE ONLY Performed at [facility], [address] [place], [place] [address]    Special Requests   Final    BOTTLES DRAWN AEROBIC ONLY Blood Culture results may not be optimal due to an inadequate volume of blood received in culture bottles Performed at [facility], [address] [place], [place] [address]    Culture   Final    NO GROWTH 3 DAYS Performed at [facility], [address] [place], [place] [address]    Report Status PENDING  Incomplete     Labs: BNP (last 3 results) Recent Labs  08/15/22 1624  BNP 45.4   Basic Metabolic Panel: Recent Labs  Lab 11/17/22 0606 11/18/22 0030 11/19/22 0500 11/20/22 0619 11/21/22 0501  NA 131* 133* 130* 133* 130*  K 3.7 3.7 3.3* 4.4 3.6  CL 100 104 101 101 99  CO2 19* 18* 17* 19* 22  GLUCOSE 109* 101* 130* 158* 137*  BUN 21 19 17 16 18   CREATININE 1.75* 1.63* 1.37*  1.20* 1.20*  CALCIUM 8.9 8.8* 8.7* 9.5 9.4   Liver Function Tests: Recent Labs  Lab 11/20/22 0619  AST 27  ALT 29  ALKPHOS 139*  BILITOT 0.3  PROT 6.9  ALBUMIN 2.6*   No results for input(s): "LIPASE", "AMYLASE" in the last 168 hours. No results for input(s): "AMMONIA" in the last 168 hours. CBC: Recent Labs  Lab 11/15/22 1234 11/16/22 0426 11/18/22 2011 11/19/22 0500 11/20/22 0619 11/21/22 0501  WBC 12.3* 10.0 13.6* 12.7* 13.1* 12.5*  NEUTROABS 10.2*  --  11.2* 10.5* 11.0* 10.0*  HGB 10.3* 9.0* 9.6* 9.0* 9.8* 9.6*  HCT 30.4* 27.4* 29.0* 27.1* 28.7* 28.0*  MCV 91.6 93.5 93.9 92.8 90.8 91.5  PLT 206 180 221 207 232 232   Cardiac Enzymes: No results for input(s): "CKTOTAL", "CKMB", "CKMBINDEX", "TROPONINI" in the last 168 hours. BNP: Invalid input(s): "POCBNP" CBG: Recent Labs  Lab 11/19/22 2326  GLUCAP 171*   D-Dimer No results for input(s): "DDIMER" in the last 72 hours. Hgb A1c No results for input(s): "HGBA1C" in the last 72 hours. Lipid Profile No results for input(s): "CHOL", "HDL", "LDLCALC", "TRIG", "CHOLHDL", "LDLDIRECT" in the last 72 hours. Thyroid function studies No results for input(s): "TSH", "T4TOTAL", "T3FREE", "THYROIDAB" in the last 72 hours.  Invalid input(s): "FREET3" Anemia work up No results for input(s): "VITAMINB12", "FOLATE", "FERRITIN", "TIBC", "IRON", "RETICCTPCT" in the last 72 hours. Urinalysis    Component Value Date/Time   COLORURINE YELLOW 11/15/2022 1240   APPEARANCEUR CLOUDY (A) 11/15/2022 1240   LABSPEC 1.005 11/15/2022 1240   PHURINE 6.0 11/15/2022 1240   GLUCOSEU NEGATIVE 11/15/2022 1240   GLUCOSEU 100 (A) 02/28/2020 1519   HGBUR MODERATE (A) 11/15/2022 1240   HGBUR negative 05/15/2009 1457   BILIRUBINUR NEGATIVE 11/15/2022 1240   BILIRUBINUR negative 12/02/2017 1142   KETONESUR NEGATIVE 11/15/2022 1240   PROTEINUR NEGATIVE 11/15/2022 1240   UROBILINOGEN 0.2 02/28/2020 1519   NITRITE NEGATIVE 11/15/2022 1240    LEUKOCYTESUR LARGE (A) 11/15/2022 1240   Sepsis Labs Recent Labs  Lab 11/18/22 2011 11/19/22 0500 11/20/22 0619 11/21/22 0501  WBC 13.6* 12.7* 13.1* 12.5*   Microbiology Recent Results (from the past 240 hour(s))  Urine Culture     Status: Abnormal   Collection Time: 11/15/22 12:40 PM   Specimen: Urine, Clean Catch  Result Value Ref Range Status   Specimen Description   Final    URINE, CLEAN CATCH Performed at [facility], [address], [place], [place] [address]    Special Requests   Final    NONE Performed at [facility], [address] [place], [place] [address]    Culture 50,000 COLONIES/mL ESCHERICHIA COLI (A)  Final   Report Status 11/18/2022 FINAL  Final   Organism ID, Bacteria ESCHERICHIA COLI (A)  Final      Susceptibility   Escherichia coli - MIC*    AMPICILLIN 4 SENSITIVE Sensitive     CEFAZOLIN <=4 SENSITIVE Sensitive     CEFEPIME <=0.12 SENSITIVE Sensitive     CEFTRIAXONE <=0.25 SENSITIVE Sensitive  CIPROFLOXACIN <=0.25 SENSITIVE Sensitive     GENTAMICIN <=1 SENSITIVE Sensitive     IMIPENEM <=0.25 SENSITIVE Sensitive     NITROFURANTOIN 32 SENSITIVE Sensitive     TRIMETH/SULFA <=20 SENSITIVE Sensitive     AMPICILLIN/SULBACTAM <=2 SENSITIVE Sensitive     PIP/TAZO <=4 SENSITIVE Sensitive ug/mL    * 50,000 COLONIES/mL ESCHERICHIA COLI  Aerobic/Anaerobic Culture w Gram Stain (surgical/deep wound)     Status: None (Preliminary result)   Collection Time: 11/17/22 11:06 AM   Specimen: Kidney; Urine  Result Value Ref Range Status   Specimen Description   Final    KIDNEY Performed at [facility], [address], [place], [place] [address]    Special Requests   Final    Normal Performed at [facility], [address] [place], [place] [address]    Gram Stain   Final    ABUNDANT WBC PRESENT, PREDOMINANTLY PMN RARE GRAM NEGATIVE RODS Performed at [facility], [address] [place], [place] [address]    Culture   Final    FEW ESCHERICHIA COLI NO ANAEROBES ISOLATED; CULTURE IN PROGRESS FOR 5 DAYS    Report Status PENDING  Incomplete   Organism ID, Bacteria ESCHERICHIA COLI  Final      Susceptibility   Escherichia coli - MIC*    AMPICILLIN 4 SENSITIVE Sensitive     CEFEPIME <=0.12 SENSITIVE Sensitive     CEFTAZIDIME <=1 SENSITIVE Sensitive     CEFTRIAXONE <=0.25 SENSITIVE Sensitive     CIPROFLOXACIN <=0.25 SENSITIVE Sensitive     GENTAMICIN <=1 SENSITIVE Sensitive     IMIPENEM <=0.25 SENSITIVE Sensitive     TRIMETH/SULFA <=20 SENSITIVE Sensitive     AMPICILLIN/SULBACTAM <=2 SENSITIVE Sensitive     PIP/TAZO <=4 SENSITIVE Sensitive ug/mL    * FEW ESCHERICHIA COLI  Culture, blood (Routine X 2) w Reflex to ID Panel     Status: None (Preliminary result)   Collection Time: 11/18/22  9:15 AM   Specimen: BLOOD  Result Value Ref Range Status   Specimen Description   Final    BLOOD BLOOD LEFT ARM AEROBIC BOTTLE ONLY Performed at [facility], [address], [place], [place] [address]    Special Requests   Final    BOTTLES DRAWN AEROBIC ONLY Blood Culture results may not be optimal due to an inadequate volume of blood received in culture bottles Performed at [facility], [address], [place], [place] [address]    Culture   Final    NO GROWTH 3 DAYS Performed at [facility], [address] [place], [place] [address]    Report Status PENDING  Incomplete  Culture, blood (Routine X 2) w Reflex to ID Panel     Status: None (Preliminary result)   Collection Time: 11/18/22  9:15 AM   Specimen: BLOOD  Result Value Ref Range Status   Specimen Description   Final    BLOOD BLOOD RIGHT HAND AEROBIC BOTTLE ONLY Performed at [facility], [address] [place], [place] [address]    Special Requests   Final    BOTTLES DRAWN AEROBIC ONLY Blood Culture results may not be optimal due to an  inadequate volume of blood received in culture bottles Performed at [facility], [address] [place], [place] [address]    Culture   Final    NO GROWTH 3 DAYS Performed at [facility], [address] [place], [place] [address]    Report Status PENDING  Incomplete    FURTHER DISCHARGE INSTRUCTIONS:   Get Medicines reviewed and adjusted: Please take all your medications with you for your next visit with your Primary MD   Laboratory/radiological data: Please request your Primary MD to go over all hospital tests and procedure/radiological results at the follow up, please ask your Primary MD to get all Hospital records sent to his/her office.   In some cases, they will be blood work, cultures and biopsy results pending at the time of your discharge. Please request that your primary care M.D. goes through all the records of your hospital data and follows up on these results.   Also Note the following: If you experience worsening of your admission symptoms, develop shortness of breath, life threatening emergency, suicidal or homicidal thoughts you must seek medical attention immediately by calling 911 or calling your MD immediately  if symptoms less severe.   You must read complete instructions/literature along with all the possible adverse reactions/side effects for all the Medicines you take and that have been prescribed to you. Take any new Medicines after you have completely understood and accpet all the possible adverse reactions/side effects.    Do not drive when taking Pain medications or sleeping medications (Benzodaizepines)   Do not take more than prescribed Pain, Sleep and Anxiety Medications. It is not advisable to combine anxiety,sleep and pain medications without talking with your primary care practitioner   Special Instructions: If you have smoked or chewed Tobacco  in the last 2 yrs please stop smoking, stop any regular Alcohol  and or any  Recreational drug use.   Wear Seat belts while driving.   Please note: You were cared for by a hospitalist during your hospital stay. Once you are discharged, your primary care physician will handle any further medical issues. Please note that NO REFILLS for any discharge medications will be authorized once you are discharged, as it is imperative that you return to your primary care physician (or establish a relationship with a primary care physician if you do not have one) for your post hospital discharge needs so that they can reassess your need for medications and monitor your lab values  Time coordinating discharge: Over 30 minutes  SIGNED:   Hughie Closs, MD  [facility] 11/21/2022, 1:48 PM *Please note that this is a verbal dictation therefore any spelling or grammatical errors are due to the "Dragon [employer] One" system interpretation. If 7PM-7AM, please contact night-coverage [email or web address]

## 2022-11-21 NOTE — TOC Transition Note (Signed)
Transition of Care [facility]) - CM/SW Discharge Note   Patient Details  Name: Laura [name] MRN: [id number] Date of Birth: 1945-10-24  [facility] CM/SW Contact:  Beckie Busing, RN Phone [phone]  11/21/2022, 1:44 PM   Clinical Narrative:    Plan is to discharge patient to [facility]. Discharge packet is at nurses station in chart. [facility] will need to be called once we have confirmed all paperwork is signed and patient can discharge.          Patient Goals and CMS Choice      Discharge Placement                         Discharge Plan and Services Additional resources added to the After Visit Summary for                                       Social Determinants of Health (SDOH) Interventions SDOH Screenings   Food Insecurity: No Food Insecurity (11/15/2022)  Housing: Patient Declined (11/15/2022)  Transportation Needs: No Transportation Needs (11/15/2022)  Utilities: Not At Risk (11/15/2022)  Alcohol Screen: Low Risk  (06/17/2021)  Depression (PHQ2-9): High Risk (06/04/2022)  Financial Resource Strain: Low Risk  (11/13/2022)  Physical Activity: Unknown (11/13/2022)  Social Connections: Moderately Integrated (11/13/2022)  Stress: Stress Concern Present (11/13/2022)  Tobacco Use: Low Risk  (11/16/2022)  Health Literacy: Inadequate Health Literacy (09/04/2022)     Readmission Risk Interventions     No data to display

## 2022-11-21 NOTE — Progress Notes (Signed)
Occupational Therapy Treatment Patient Details Name: Laura [name] MRN: [id number] DOB: Mar 30, 1945 Today's Date: 11/21/2022   History of present illness Patient is a 77 year old female who presented to the ED on 11/2 with intractable pain. Patient was admitted with right hydronephrosis secondary to malignant obstruction. Patient underwent right PCN placement on 11/4. PMH: ovarian cancer, DM II, GERD, IBS   OT comments  Pt. Seen for skilled OT treatment. Granddaughter present for most of session. Very involved and helps pt. With all needs.  Pt. Able to follow instructions/cues for bed mobility and bsc transfer cga/min a.  Tolerated well even with c/o pain but was able to get comfortable in bed at end of session with adjustments to positioning and pillow placement behind back and to R side.  Cont. With current acute OT poc.        If plan is discharge home, recommend the following:  Two people to help with walking and/or transfers;A lot of help with bathing/dressing/bathroom;Assistance with cooking/housework;Assist for transportation;Direct supervision/assist for medications management;Direct supervision/assist for financial management;Help with stairs or ramp for entrance   Equipment Recommendations  None recommended by OT    Recommendations for Other Services      Precautions / Restrictions Precautions Precautions: Fall Precaution Comments: RLQ posterior drain       Mobility Bed Mobility Overal bed mobility: Needs Assistance Bed Mobility: Sidelying to Sit, Sit to Supine   Sidelying to sit: Contact guard assist, Used rails   Sit to supine: Supervision   General bed mobility comments: pt. was in L side lying upon arrival, able to follow sequencing cues to push trunk upright into sitting while pushing through lue and holding onto rail with rue.  tolerated well with min c/o pain.  for return to bed after toileting pt. able to bring bles into bed without assistance, light trunk support  for pillow placement    Transfers Overall transfer level: Needs assistance Equipment used: 1 person hand held assist Transfers: Sit to/from Stand, Bed to chair/wheelchair/BSC Sit to Stand: Min assist Stand pivot transfers: Contact guard assist         General transfer comment: VCs for hand placement     Balance                                           ADL either performed or assessed with clinical judgement   ADL Overall ADL's : Needs assistance/impaired                         Toilet Transfer: Stand-pivot;BSC/3in1;Contact guard assist Toilet Transfer Details (indicate cue type and reason): cues for hand placement and pivot step Toileting- Clothing Manipulation and Hygiene: Set up;Sitting/lateral lean       Functional mobility during ADLs: Contact guard assist      Extremity/Trunk Assessment              Vision       Perception     Praxis      Cognition Arousal: Alert Behavior During Therapy: WFL for tasks assessed/performed Overall Cognitive Status: Within Functional Limits for tasks assessed                                 General Comments: patient was plesant and cooperative during session.  Exercises      Shoulder Instructions       General Comments      Pertinent Vitals/ Pain       Pain Assessment Pain Assessment: Faces Faces Pain Scale: Hurts little more Pain Location: R buttock and low back Pain Descriptors / Indicators: Discomfort, Grimacing  Home Living                                          Prior Functioning/Environment              Frequency  Min 1X/week        Progress Toward Goals  OT Goals(current goals can now be found in the care plan section)  Progress towards OT goals: Progressing toward goals     Plan      Co-evaluation                 AM-PAC OT "6 Clicks" Daily Activity     Outcome Measure   Help from another person  eating meals?: A Little Help from another person taking care of personal grooming?: A Little Help from another person toileting, which includes using toliet, bedpan, or urinal?: A Lot Help from another person bathing (including washing, rinsing, drying)?: A Lot Help from another person to put on and taking off regular upper body clothing?: A Little Help from another person to put on and taking off regular lower body clothing?: A Lot 6 Click Score: 15    End of Session    OT Visit Diagnosis: Unsteadiness on feet (R26.81);Other abnormalities of gait and mobility (R26.89);Muscle weakness (generalized) (M62.81);Pain   Activity Tolerance Patient tolerated treatment well   Patient Left in bed;with call bell/phone within reach   Nurse Communication          Time: 1610-9604 OT Time Calculation (min): 21 min  Charges: OT General Charges $OT Visit: 1 Visit OT Treatments $Self Care/Home Management : 8-22 mins  Boneta Lucks, COTA/L Acute Rehabilitation [phone]   Alessandra Bevels Lorraine-COTA/L 11/21/2022, 1:06 PM

## 2022-11-21 NOTE — Plan of Care (Signed)
  Problem: Education: Goal: Knowledge of General Education information will improve Description: Including pain rating scale, medication(s)/side effects and non-pharmacologic comfort measures Outcome: Progressing   Problem: Health Behavior/Discharge Planning: Goal: Ability to manage health-related needs will improve Outcome: Progressing   Problem: Clinical Measurements: Goal: Will remain free from infection Outcome: Progressing Goal: Diagnostic test results will improve Outcome: Progressing Goal: Respiratory complications will improve Outcome: Progressing Goal: Cardiovascular complication will be avoided Outcome: Progressing   Problem: Nutrition: Goal: Adequate nutrition will be maintained Outcome: Progressing   Problem: Elimination: Goal: Will not experience complications related to urinary retention Outcome: Progressing   Problem: Safety: Goal: Ability to remain free from injury will improve Outcome: Progressing   Problem: Skin Integrity: Goal: Risk for impaired skin integrity will decrease Outcome: Progressing

## 2022-11-21 NOTE — Progress Notes (Signed)
Report received from [place], [place] at [employer]

## 2022-11-21 NOTE — Progress Notes (Signed)
Mobility Specialist - Progress Note   11/21/22 1033  Mobility  Activity Transferred from chair to bed;Transferred from bed to chair  Level of Assistance Standby assist, set-up cues, supervision of patient - no hands on  Assistive Device Front wheel walker  Distance Ambulated (ft) 2 ft  Activity Response Tolerated well  Mobility Referral Yes  $Mobility charge 1 Mobility  Mobility Specialist Start Time (ACUTE ONLY) 1002  Mobility Specialist Stop Time (ACUTE ONLY) 1018  Mobility Specialist Time Calculation (min) (ACUTE ONLY) 16 min   Pt received in bed and agreeable to transfer to recliner. Pt was modA from supine>sitting. Once in recliner, pt c/o pain from hip radiating down leg. Assisted pt w/ getting back into bed. Pt was minA to get back into bed. No other complaints during session. MD in room during encounter. RN made aware of session. Pt to bed after session with all needs met. Bed alarm on.   [facility]

## 2022-11-22 ENCOUNTER — Emergency Department (HOSPITAL_COMMUNITY)
Admission: EM | Admit: 2022-11-22 | Discharge: 2022-11-22 | Disposition: A | Discharge status: Home / Self Care | Payer: 59 | Attending: Emergency Medicine | Admitting: Emergency Medicine

## 2022-11-22 DIAGNOSIS — R52 Pain, unspecified: Secondary | ICD-10-CM | POA: Diagnosis not present

## 2022-11-22 DIAGNOSIS — M791 Myalgia, unspecified site: Secondary | ICD-10-CM | POA: Insufficient documentation

## 2022-11-22 DIAGNOSIS — I10 Essential (primary) hypertension: Secondary | ICD-10-CM | POA: Insufficient documentation

## 2022-11-22 DIAGNOSIS — Z8616 Personal history of COVID-19: Secondary | ICD-10-CM | POA: Diagnosis not present

## 2022-11-22 DIAGNOSIS — D72829 Elevated white blood cell count, unspecified: Secondary | ICD-10-CM | POA: Insufficient documentation

## 2022-11-22 DIAGNOSIS — R1030 Lower abdominal pain, unspecified: Secondary | ICD-10-CM | POA: Diagnosis not present

## 2022-11-22 DIAGNOSIS — Z8543 Personal history of malignant neoplasm of ovary: Secondary | ICD-10-CM | POA: Diagnosis not present

## 2022-11-22 DIAGNOSIS — E876 Hypokalemia: Secondary | ICD-10-CM | POA: Diagnosis not present

## 2022-11-22 DIAGNOSIS — Z79899 Other long term (current) drug therapy: Secondary | ICD-10-CM | POA: Diagnosis not present

## 2022-11-22 DIAGNOSIS — E119 Type 2 diabetes mellitus without complications: Secondary | ICD-10-CM | POA: Insufficient documentation

## 2022-11-22 DIAGNOSIS — E871 Hypo-osmolality and hyponatremia: Secondary | ICD-10-CM | POA: Diagnosis not present

## 2022-11-22 DIAGNOSIS — C569 Malignant neoplasm of unspecified ovary: Secondary | ICD-10-CM | POA: Diagnosis not present

## 2022-11-22 DIAGNOSIS — Z7984 Long term (current) use of oral hypoglycemic drugs: Secondary | ICD-10-CM | POA: Insufficient documentation

## 2022-11-22 DIAGNOSIS — R000 Tachycardia, unspecified: Secondary | ICD-10-CM | POA: Insufficient documentation

## 2022-11-22 DIAGNOSIS — R6889 Other general symptoms and signs: Secondary | ICD-10-CM | POA: Diagnosis not present

## 2022-11-22 LAB — URINALYSIS, ROUTINE W REFLEX MICROSCOPIC
Bilirubin Urine: NEGATIVE
Glucose, UA: 50 mg/dL — AB
Ketones, ur: NEGATIVE mg/dL
Leukocytes,Ua: NEGATIVE
Nitrite: NEGATIVE
Protein, ur: 30 mg/dL — AB
RBC / HPF: >50 RBC/hpf (ref 0–5)
Specific Gravity, Urine: 1.004 — ABNORMAL LOW (ref 1.005–1.030)
pH: 8 (ref 5.0–8.0)

## 2022-11-22 LAB — COMPREHENSIVE METABOLIC PANEL
ALT: 29 U/L (ref 0–44)
AST: 29 U/L (ref 15–41)
Albumin: 3.1 g/dL — ABNORMAL LOW (ref 3.5–5.0)
Alkaline Phosphatase: 104 U/L (ref 38–126)
Anion gap: 14 (ref 5–15)
BUN: 18 mg/dL (ref 8–23)
CO2: 21 mmol/L — ABNORMAL LOW (ref 22–32)
Calcium: 9.5 mg/dL (ref 8.9–10.3)
Chloride: 98 mmol/L (ref 98–111)
Creatinine, Ser: 1.32 mg/dL — ABNORMAL HIGH (ref 0.44–1.00)
GFR, Estimated: 41 mL/min — ABNORMAL LOW (ref >60)
Glucose, Bld: 163 mg/dL — ABNORMAL HIGH (ref 70–99)
Potassium: 3.3 mmol/L — ABNORMAL LOW (ref 3.5–5.1)
Sodium: 133 mmol/L — ABNORMAL LOW (ref 135–145)
Total Bilirubin: 0.7 mg/dL (ref <1.2)
Total Protein: 7.2 g/dL (ref 6.5–8.1)

## 2022-11-22 LAB — CBC WITH DIFFERENTIAL/PLATELET
Abs Immature Granulocytes: 0.8 10*3/uL — ABNORMAL HIGH (ref 0.00–0.07)
Basophils Absolute: 0 10*3/uL (ref 0.0–0.1)
Basophils Relative: 0 %
Eosinophils Absolute: 0 10*3/uL (ref 0.0–0.5)
Eosinophils Relative: 0 %
HCT: 30.9 % — ABNORMAL LOW (ref 36.0–46.0)
Hemoglobin: 10.6 g/dL — ABNORMAL LOW (ref 12.0–15.0)
Immature Granulocytes: 5 %
Lymphocytes Relative: 10 %
Lymphs Abs: 1.6 10*3/uL (ref 0.7–4.0)
MCH: 31.1 pg (ref 26.0–34.0)
MCHC: 34.3 g/dL (ref 30.0–36.0)
MCV: 90.6 fL (ref 80.0–100.0)
Monocytes Absolute: 1 10*3/uL (ref 0.1–1.0)
Monocytes Relative: 6 %
Neutro Abs: 12.1 10*3/uL — ABNORMAL HIGH (ref 1.7–7.7)
Neutrophils Relative %: 79 %
Platelets: 286 10*3/uL (ref 150–400)
RBC: 3.41 MIL/uL — ABNORMAL LOW (ref 3.87–5.11)
RDW: 15.9 % — ABNORMAL HIGH (ref 11.5–15.5)
WBC: 15.5 10*3/uL — ABNORMAL HIGH (ref 4.0–10.5)
nRBC: 0 % (ref 0.0–0.2)

## 2022-11-22 LAB — AEROBIC/ANAEROBIC CULTURE W GRAM STAIN (SURGICAL/DEEP WOUND): Special Requests: NORMAL

## 2022-11-22 MED ORDER — HEPARIN SOD (PORK) LOCK FLUSH 100 UNIT/ML IV SOLN
500.0000 [IU] | Freq: Once | INTRAVENOUS | Status: AC
  Administered 2022-11-22: 500 [IU]
  Filled 2022-11-22: qty 5

## 2022-11-22 MED ORDER — HYDROMORPHONE HCL 1 MG/ML IJ SOLN
1.0000 mg | INTRAMUSCULAR | Status: DC | PRN
  Administered 2022-11-22 (×3): 1 mg via INTRAVENOUS
  Filled 2022-11-22 (×3): qty 1

## 2022-11-22 MED ORDER — DIPHENHYDRAMINE HCL 50 MG/ML IJ SOLN
25.0000 mg | Freq: Once | INTRAMUSCULAR | Status: AC
  Administered 2022-11-22: 25 mg via INTRAVENOUS
  Filled 2022-11-22: qty 1

## 2022-11-22 MED ORDER — FENTANYL CITRATE PF 50 MCG/ML IJ SOSY
100.0000 ug | PREFILLED_SYRINGE | Freq: Once | INTRAMUSCULAR | Status: AC
  Administered 2022-11-22: 100 ug via INTRAVENOUS
  Filled 2022-11-22: qty 2

## 2022-11-22 MED ORDER — HALOPERIDOL LACTATE 5 MG/ML IJ SOLN
5.0000 mg | Freq: Once | INTRAMUSCULAR | Status: AC
Start: 2022-11-22 — End: 2022-11-22
  Administered 2022-11-22: 5 mg via INTRAVENOUS
  Filled 2022-11-22: qty 1

## 2022-11-22 MED ORDER — KETOROLAC TROMETHAMINE 30 MG/ML IJ SOLN
15.0000 mg | Freq: Once | INTRAMUSCULAR | Status: AC
  Administered 2022-11-22: 15 mg via INTRAVENOUS
  Filled 2022-11-22: qty 1

## 2022-11-22 MED ORDER — HYDROXYZINE HCL 25 MG PO TABS
25.0000 mg | ORAL_TABLET | Freq: Three times a day (TID) | ORAL | Status: DC | PRN
  Administered 2022-11-22: 25 mg via ORAL
  Filled 2022-11-22: qty 1

## 2022-11-22 NOTE — Progress Notes (Signed)
[facility] hospital liaison note  Patient was admitted to [facility] evening of 11.8.24. Unfortunately during the night patient experienced episode of unmanaged pain and family chose to call 911 and have her transported to ED.   Have met with patient and numerous family members. Determination made with input of patient to place foley catheter to aide in management of pain due to movement.   At this time patient and family are in agreement for patient to transfer back to [facility]. EMS will be called.   Please leave IV intact and send DNR. Report can be called to [phone]  Please don't hesitate to reach out for any hospice related questions or concerns.  Thea Gist, BSN RN Hospice hospital liaison [phone]

## 2022-11-22 NOTE — ED Provider Notes (Signed)
[place] EMERGENCY DEPARTMENT AT [facility] Provider Note   CSN: [id number] Arrival date & time: 11/22/22  0119     History  Chief Complaint  Patient presents with   Pain    Laura [name] is a 77 y.o. female.  The history is provided by the patient.  Illness Location:  Body Quality:  Intractable pain secondary to metastatic ovarin cancer Severity:  Severe Onset quality:  Gradual Timing:  Constant Progression:  Worsening Context:  Metastatic CA Relieved by:  Nothing Ineffective treatments:  Outpatient pain regimen Associated symptoms: myalgias   Associated symptoms: no fever, no vomiting and no wheezing   Ovarian cancer patient with ongoing pain post discharge from the hospital to hospice.    Past Medical History:  Diagnosis Date   Anemia    Anxiety    Arthritis    back of neck, bones spurs on neck   Cancer (HCC)    ovarian cancer   Cataract both eyes    Cervical disc disease    Diabetes mellitus Type 2    Dyspnea    with exertion   Family history of adverse reaction to anesthesia    sister slow to awaken   Family history of thyroid cancer    GERD (gastroesophageal reflux disease)    Heart murmur    History of COVID-19 06/2020   pain in side took paxlovid x 5 days all symptoms resolved   History of radiation therapy    abdominal lymph nodes 01/21/2021-02/01/2021  Dr Antony Blackbird   Hydronephrosis 08/24/2020   Right Kidney (stable per CT)   Hyperlipidemia    Hypertension    IBS (irritable bowel syndrome)    Left shoulder frozen with limited rom    Mucoid cyst of joint    right thumb   Neuropathy 03/12/2021   hands/fingers both hands numb and tingle @ times   Peripheral vascular disease (HCC)    Port-A-Cath in place 07/21/2019   Reflux    Sleep apnea 03/12/2021   has not used cpap in 3 years   Vertigo 12/2010   none since treated at [facility]   Wears glasses for reading    Wears partial dentures upper        Home Medications Prior to  Admission medications   Medication Sig Start Date End Date Taking? Authorizing Provider  acetaminophen (TYLENOL) 500 MG tablet Take 1 tablet (500 mg total) by mouth every 6 (six) hours as needed. Patient taking differently: Take 500 mg by mouth every 6 (six) hours as needed for mild pain (pain score 1-3) or moderate pain (pain score 4-6). 05/16/20   Kerrin Champagne, MD  amLODipine (NORVASC) 10 MG tablet Take 1 tablet (10 mg total) by mouth daily. Patient taking differently: Take 5 mg by mouth daily. 07/29/22   Doreatha Massed, MD  bismuth subsalicylate (PEPTO BISMOL) 262 MG/15ML suspension Take 30 mLs by mouth every 6 (six) hours as needed for indigestion or diarrhea or loose stools.    [provider]  Blood Glucose Monitoring Suppl (ONE TOUCH ULTRA 2) w/Device KIT Use as directed 04/06/15   Corwin Levins, MD  cefadroxil (DURICEF) 500 MG capsule Take 1 capsule (500 mg total) by mouth 2 (two) times daily for 5 days. 11/21/22 11/26/22  Hughie Closs, MD  docusate sodium (COLACE) 100 MG capsule Take 2 capsules (200 mg total) by mouth 2 (two) times daily. Patient taking differently: Take 100 mg by mouth as needed for moderate constipation. 07/01/22  Rojelio Brenner M, PA-C  famotidine-calcium carbonate-magnesium hydroxide (PEPCID COMPLETE) 10-800-165 MG chewable tablet Chew 1 tablet by mouth daily.    [provider]  furosemide (LASIX) 20 MG tablet TAKE 1 TABLET (20MG ) BY MOUTH DAILYAS NEEDED FOR EDEMA (SWELLING) Patient taking differently: Take 20 mg by mouth daily. 09/28/22   Carnella Guadalajara, PA-C  glimepiride (AMARYL) 2 MG tablet TAKE 1/2 TABLET (1MG ) BY MOUTH ONCE DAILY BEFORE BREAKFAST 08/21/22   Corwin Levins, MD  glucose blood [facility] ULTRA) test strip USE TO CHECK BLOOD SUGARS TWO TIMES DAILY 03/22/21   Corwin Levins, MD  HYDROmorphone (DILAUDID) 4 MG tablet Take 4 mg by mouth every 4 (four) hours as needed for severe pain (PRN as needed.).    [provider]   Lancets [facility] ULTRASOFT) lancets 1 each by Other route as needed for other. Use as instructed 03/20/21   Corwin Levins, MD  lidocaine (LIDODERM) 5 % Place 1 patch onto the skin daily. Remove & Discard patch within 12 hours or as directed by MD Patient taking differently: Place 1-3 patches onto the skin daily as needed (pain). Remove & Discard patch within 12 hours or as directed by MD 09/01/22   Doreatha Massed, MD  loperamide (IMODIUM A-D) 2 MG tablet Take 2-6 mg by mouth as needed for diarrhea or loose stools.    [provider]  loratadine (CLARITIN) 10 MG tablet Take 10 mg by mouth daily.    [provider]  megestrol (MEGACE) 40 MG/ML suspension Take by mouth daily. Patient not taking: Reported on 11/15/2022    [provider]  meloxicam (MOBIC) 7.5 MG tablet Take 7.5 mg by mouth daily. 11/10/22   [provider]  prochlorperazine (COMPAZINE) 10 MG tablet Take 1 tablet (10 mg total) by mouth every 6 (six) hours as needed for nausea or vomiting. 07/14/22   Doreatha Massed, MD  solifenacin (VESICARE) 5 MG tablet Take 1 tablet (5 mg total) by mouth daily. 10/13/22     traMADol (ULTRAM) 50 MG tablet Take 1 every 6 hours for pain not relieved by Tylenol alone Patient taking differently: Take 50 mg by mouth every 6 (six) hours as needed for moderate pain (pain score 4-6) or severe pain (pain score 7-10). 11/12/22   Bethann Berkshire, MD  traZODone (DESYREL) 100 MG tablet 1/2 - 1 tab by mouth at bedtime for sleep as needed Patient taking differently: Take 50 mg by mouth at bedtime as needed for sleep. 08/26/22   Corwin Levins, MD      Allergies    Ciprofloxacin, Hydrocodone bit-homatrop mbr, Prednisone, Sulfa antibiotics, Alfuzosin, Codeine, Crestor [rosuvastatin calcium], Doxycycline, Gabapentin, Keflex [cephalexin], Myrbetriq [mirabegron er], Naproxen, and Statins    Review of Systems   Review of Systems  Constitutional:  Negative for fever.  HENT:   Negative for facial swelling.   Eyes:  Negative for redness.  Respiratory:  Negative for wheezing.   Gastrointestinal:  Negative for vomiting.  Musculoskeletal:  Positive for arthralgias and myalgias.  All other systems reviewed and are negative.   Physical Exam Updated Vital Signs BP (!) 188/95   Pulse (!) 120   Temp 98.2 F (36.8 C) (Axillary)   Resp 19   Wt 82 kg   SpO2 100%   BMI 32.02 kg/m  Physical Exam Vitals and nursing note reviewed.  Constitutional:      General: She is not in acute distress.    Appearance: She is well-developed. She is not diaphoretic.  HENT:     Head: Normocephalic and atraumatic.     Nose: Nose normal.  Eyes:     Pupils: Pupils are equal, round, and reactive to light.  Cardiovascular:     Rate and Rhythm: Regular rhythm. Tachycardia present.     Pulses: Normal pulses.     Heart sounds: Normal heart sounds.  Pulmonary:     Effort: No respiratory distress.     Breath sounds: Normal breath sounds.  Abdominal:     General: Bowel sounds are normal. There is no distension.     Palpations: Abdomen is soft.     Tenderness: There is no abdominal tenderness. There is no guarding or rebound.  Musculoskeletal:        General: Normal range of motion.     Cervical back: Normal range of motion and neck supple.  Skin:    General: Skin is warm and dry.     Findings: No erythema or rash.  Neurological:     General: No focal deficit present.     Mental Status: She is alert and oriented to person, place, and time.     Deep Tendon Reflexes: Reflexes normal.     ED Results / Procedures / Treatments   Labs (all labs ordered are listed, but only abnormal results are displayed) Results for orders placed or performed during the hospital encounter of 11/22/22  CBC with Differential  Result Value Ref Range   WBC 15.5 (H) 4.0 - 10.5 K/uL   RBC 3.41 (L) 3.87 - 5.11 MIL/uL   Hemoglobin 10.6 (L) 12.0 - 15.0 g/dL   HCT 65.7 (L) 84.6 - 96.2 %   MCV 90.6 80.0  - 100.0 fL   MCH 31.1 26.0 - 34.0 pg   MCHC 34.3 30.0 - 36.0 g/dL   RDW 95.2 (H) 84.1 - 32.4 %   Platelets 286 150 - 400 K/uL   nRBC 0.0 0.0 - 0.2 %   Neutrophils Relative % 79 %   Neutro Abs 12.1 (H) 1.7 - 7.7 K/uL   Lymphocytes Relative 10 %   Lymphs Abs 1.6 0.7 - 4.0 K/uL   Monocytes Relative 6 %   Monocytes Absolute 1.0 0.1 - 1.0 K/uL   Eosinophils Relative 0 %   Eosinophils Absolute 0.0 0.0 - 0.5 K/uL   Basophils Relative 0 %   Basophils Absolute 0.0 0.0 - 0.1 K/uL   Immature Granulocytes 5 %   Abs Immature Granulocytes 0.80 (H) 0.00 - 0.07 K/uL  Urinalysis, Routine w reflex microscopic -Urine, Clean Catch  Result Value Ref Range   Color, Urine STRAW (A) YELLOW   APPearance CLEAR CLEAR   Specific Gravity, Urine 1.004 (L) 1.005 - 1.030   pH 8.0 5.0 - 8.0   Glucose, UA 50 (A) NEGATIVE mg/dL   Hgb urine dipstick LARGE (A) NEGATIVE   Bilirubin Urine NEGATIVE NEGATIVE   Ketones, ur NEGATIVE NEGATIVE mg/dL   Protein, ur 30 (A) NEGATIVE mg/dL   Nitrite NEGATIVE NEGATIVE   Leukocytes,Ua NEGATIVE NEGATIVE   RBC / HPF >50 0 - 5 RBC/hpf   WBC, UA 6-10 0 - 5 WBC/hpf   Bacteria, UA RARE (A) NONE SEEN   Squamous Epithelial / HPF 0-5 0 - 5 /HPF   Mucus PRESENT   Comprehensive metabolic panel  Result Value Ref Range   Sodium 133 (L) 135 - 145 mmol/L   Potassium 3.3 (L) 3.5 - 5.1 mmol/L   Chloride 98 98 - 111 mmol/L   CO2 21 (L) 22 -  32 mmol/L   Glucose, Bld 163 (H) 70 - 99 mg/dL   BUN 18 8 - 23 mg/dL   Creatinine, Ser 8.41 (H) 0.44 - 1.00 mg/dL   Calcium 9.5 8.9 - 32.4 mg/dL   Total Protein 7.2 6.5 - 8.1 g/dL   Albumin 3.1 (L) 3.5 - 5.0 g/dL   AST 29 15 - 41 U/L   ALT 29 0 - 44 U/L   Alkaline Phosphatase 104 38 - 126 U/L   Total Bilirubin 0.7 <1.2 mg/dL   GFR, Estimated 41 (L) >60 mL/min   Anion gap 14 5 - 15   *Note: Due to a large number of results and/or encounters for the requested time period, some results have not been displayed. A complete set of results can be  found in Results Review.   IR NEPHROSTOMY PLACEMENT RIGHT  Result Date: 11/17/2022 CLINICAL DATA:  Right renal obstruction with clinical evidence of infection and need for percutaneous nephrostomy tube placement. EXAM: 1. ULTRASOUND GUIDANCE FOR PUNCTURE OF THE RIGHT RENAL COLLECTING SYSTEM. 2. RIGHT PERCUTANEOUS NEPHROSTOMY TUBE PLACEMENT. COMPARISON:  None Available. ANESTHESIA/SEDATION: Moderate (conscious) sedation was employed during this procedure. A total of Versed 4.0 mg and Fentanyl 200 mcg was administered intravenously by radiology nursing. Moderate Sedation Time: 14 minutes. The patient's level of consciousness and vital signs were monitored continuously by radiology nursing throughout the procedure under my direct supervision. CONTRAST:  15 mL Omnipaque 300 MEDICATIONS: None additional FLUOROSCOPY TIME:  26 seconds.  12.0 mGy. PROCEDURE: The procedure, risks, benefits, and alternatives were explained to the patient. Questions regarding the procedure were encouraged and answered. The patient understands and consents to the procedure. A time-out was performed prior to initiating the procedure. The right flank region was prepped with chlorhexidine in a sterile fashion, and a sterile drape was applied covering the operative field. A sterile gown and sterile gloves were used for the procedure. Local anesthesia was provided with 1% Lidocaine. Ultrasound was used to localize the right kidney. Under direct ultrasound guidance, an 18 gauge trocar needle was advanced into the renal collecting system. Ultrasound image documentation was performed. Aspiration of urine sample was performed followed by contrast injection. Aspirated urine sample was sent for culture analysis. Percutaneous tract dilatation was then performed over the guidewire. A 10-French percutaneous nephrostomy tube was then advanced and formed in the collecting system. Catheter position was confirmed by fluoroscopy after contrast injection. The  catheter was secured at the skin with a Prolene retention suture and Stat-Lock device. A gravity bag was placed. COMPLICATIONS: None. FINDINGS: Ultrasound demonstrates severe hydronephrosis of the right kidney. After access of the upper pole collecting system, aspiration yielded grossly purulent fluid consistent with pyonephrosis. A 20 mL sample of purulent fluid was sent for culture analysis. The nephrostomy tube was formed at the level of the renal pelvis. There is return of purulent fluid with gravity bag drainage. IMPRESSION: High-grade obstruction of the right kidney with return of grossly purulent fluid consistent with pyonephrosis. A fluid sample was sent for culture analysis. A 10 French nephrostomy tube was placed via upper pole access and was placed to gravity bag drainage. Electronically Signed   By: Irish Lack M.D.   On: 11/17/2022 11:24   CT ABDOMEN WO CONTRAST  Result Date: 11/15/2022 CLINICAL DATA:  Flank pain. History of ovarian cancer and chemotherapy. EXAM: CT ABDOMEN WITHOUT CONTRAST TECHNIQUE: Multidetector CT imaging of the abdomen was performed following the standard protocol without IV contrast. RADIATION DOSE REDUCTION: This exam was performed  according to the departmental dose-optimization program which includes automated exposure control, adjustment of the mA and/or kV according to patient size and/or use of iterative reconstruction technique. COMPARISON:  CT abdomen pelvis dated 08/25/2022. FINDINGS: Evaluation of this exam is limited in the absence of intravenous contrast. Lower chest: There are bibasilar linear atelectasis/scarring. Trace bilateral pleural effusions suspected. There is 3 vessel coronary vascular calcification. No intra-abdominal free air. Hepatobiliary: The liver is unremarkable. No biliary ductal dilatation. Cholecystectomy. No retained calcified stone noted in the central CBD. Pancreas: Unremarkable. No pancreatic ductal dilatation or surrounding inflammatory  changes. Spleen: Normal in size without focal abnormality. Adrenals/Urinary Tract: The adrenal glands are unremarkable. There is mild left hydronephrosis similar to prior CT. No stone identified in the left kidney or the visualized left ureter. There is severe right hydronephrosis, significantly progressed since the prior CT. Interval removal of the previously seen right ureteral stent. No stone identified. Stomach/Bowel: There is a small hiatal hernia. Dense stool noted throughout the colon. No bowel dilatation in the visualized abdomen. Vascular/Lymphatic: Moderate aortoiliac atherosclerotic disease. Retroperitoneal adenopathy encasing the distal abdominal aorta. There is a 5.6 x 4.7 cm mass inferior to the right renal hilum contiguous with the right paraspinal/retroperitoneal mass. Overall similar or slightly increased since the prior CT. Other: None Musculoskeletal: Osteopenia with degenerative changes. No acute osseous pathology. IMPRESSION: 1. Interval removal of the previously seen right ureteral stent with development of severe right hydronephrosis. 2. Retroperitoneal adenopathy similar or slightly progressed since the prior CT. 3. Mild left hydronephrosis similar to prior CT. 4. Aortic Atherosclerosis (ICD10-I70.0). Electronically Signed   By: Elgie Collard M.D.   On: 11/15/2022 15:36   DG Lumbar Spine Complete  Result Date: 11/12/2022 CLINICAL DATA:  Low back pain.  Right hip/leg pain. EXAM: LUMBAR SPINE - COMPLETE 4+ VIEW COMPARISON:  None Available. FINDINGS: There are 5 nonrib-bearing lumbar vertebrae. Anatomic lumbar curvature. No spondylolysis. There is grade 1 anterolisthesis of L4 over L5, similar to the prior study. Vertebral body heights are maintained. No aggressive osseous lesion. Mild-moderate multilevel degenerative changes in the form of reduced intervertebral disc height, endplate sclerosis/irregularity, facet arthropathy and marginal osteophyte formation. Sacroiliac joints are  symmetric. Visualized soft tissues are within normal limits. There are surgical clips in the right upper quadrant, typical of a previous cholecystectomy. IMPRESSION: *No acute osseous abnormality of the lumbar spine. *Mild-to-moderate multilevel degenerative changes, as described above. Electronically Signed   By: Jules Schick M.D.   On: 11/12/2022 08:44    Radiology No results found.  Procedures Procedures    Medications Ordered in ED Medications  diphenhydrAMINE (BENADRYL) injection 25 mg (has no administration in time range)  fentaNYL (SUBLIMAZE) injection 100 mcg (has no administration in time range)  fentaNYL (SUBLIMAZE) injection 100 mcg (100 mcg Intravenous Given 11/22/22 0211)  haloperidol lactate (HALDOL) injection 5 mg (5 mg Intravenous Given 11/22/22 0211)  ketorolac (TORADOL) 30 MG/ML injection 15 mg (15 mg Intravenous Given 11/22/22 0259)    ED Course/ Medical Decision Making/ A&P                                 Medical Decision Making Patient discharged to hospice and could not achieve pain control at hospice and was sent to the ED  Amount and/or Complexity of Data Reviewed Independent Historian: EMS    Details: See above  External Data Reviewed: notes.    Details: Previous notes reviewed  Labs: ordered.  Details: Elevated white count 15.5, low hemoglobin 10.6, normal platelets. No uti on urine.  Sodium low 133, potassium low 3.3, creatinine slight elevation 1.32   Risk Prescription drug management. Parenteral controlled substances. Decision regarding hospitalization. Risk Details: Will admit for further pain control.      Final Clinical Impression(s) / ED Diagnoses Final diagnoses:  Intractable pain   The patient appears reasonably stabilized for admission considering the current resources, flow, and capabilities available in the ED at this time, and I doubt any other [facility] requiring further screening and/or treatment in the ED prior to admission.  Rx / DC  Orders ED Discharge Orders     None         Carolos Fecher, MD 11/22/22 [phone]

## 2022-11-22 NOTE — ED Notes (Addendum)
Spoke to family at this time about patient's care and medications that have been administered so far. Given permission by patient to list Ruffin Pyo (granddaughter) on contacts. Family is calm, pleasant, receptive to care and information throughout conversation.   Pt is now resting comfortably, so her granddaughter is going to go home at this time. Her contact is available for updates.

## 2022-11-22 NOTE — ED Notes (Signed)
Spoke to [employer] and updated her about her grandmother's care. Unable to reach granddaughter who originally came to visit patient.

## 2022-11-22 NOTE — Consult Note (Signed)
[facility] Initial Consultation Note  Laura [name] ZOX:[id number] DOB: 1945/06/20 DOA: 11/22/2022  PCP: Corwin Levins, MD   Requesting Physician: Nicanor Alcon, MD   Reason for Consultation: Pain  HPI: Laura [name] is a 77 y.o. female with medical history significant for type 2 diabetes, GERD, just discharged from the hospital yesterday after admission for AKI, sepsis secondary to UTI.  After palliative care consultation and goals of care discussion with family during this last hospital stay, decision was made by family to take her home with an for comfort care.  However she had some uncontrolled pain, so was seen by hospice services and discharged to [facility] yesterday 11/8.  Apparently there, her pain was uncontrolled, documentation states that family was unhappy with the care there and so she was brought back to the ER.  Hospitalist team was consulted to consider admission.  On my evaluation with the patient, she denies any fevers, new pain, or vomiting.  Tells me that she has a burning sensation in her feet, and that she wants her family to just take her home.  Review of Systems: Please see HPI for pertinent positives and negatives. A complete 10 system review of systems are otherwise negative.  Past Medical History:  Diagnosis Date   Anemia    Anxiety    Arthritis    back of neck, bones spurs on neck   Cancer (HCC)    ovarian cancer   Cataract both eyes    Cervical disc disease    Diabetes mellitus Type 2    Dyspnea    with exertion   Family history of adverse reaction to anesthesia    sister slow to awaken   Family history of thyroid cancer    GERD (gastroesophageal reflux disease)    Heart murmur    History of COVID-19 06/2020   pain in side took paxlovid x 5 days all symptoms resolved   History of radiation therapy    abdominal lymph nodes 01/21/2021-02/01/2021  Dr Antony Blackbird   Hydronephrosis 08/24/2020   Right Kidney (stable per CT)   Hyperlipidemia     Hypertension    IBS (irritable bowel syndrome)    Left shoulder frozen with limited rom    Mucoid cyst of joint    right thumb   Neuropathy 03/12/2021   hands/fingers both hands numb and tingle @ times   Peripheral vascular disease (HCC)    Port-A-Cath in place 07/21/2019   Reflux    Sleep apnea 03/12/2021   has not used cpap in 3 years   Vertigo 12/2010   none since treated at [facility]   Wears glasses for reading    Wears partial dentures upper    Past Surgical History:  Procedure Laterality Date   BLADDER SURGERY     x 3 at [facility]   BREAST EXCISIONAL BIOPSY Bilateral    BREAST SURGERY     fibroid cyst removed ? over 10 yrs ago at [facility] per pt on 03-12-2021   CHOLECYSTECTOMY     yrs ago   COLONOSCOPY  07/02/2020   and 09-19-2016   CYSTOSCOPY W/ URETERAL STENT PLACEMENT Right 03/13/2021   Procedure: CYSTOSCOPY WITH RETROGRADE PYELOGRAM/URETERAL STENT PLACEMENT;  Surgeon: Noel Christmas, MD;  [facility];  Service: Urology;  Laterality: Right;  30 MINS   CYSTOSCOPY W/ URETERAL STENT PLACEMENT Right 10/01/2021   Procedure: CYSTOSCOPY WITH RETROGRADE PYELOGRAM/URETERAL STENT REPLACEMENT;  Surgeon: Noel Christmas, MD;  Location:   SURGERY CENTER;  Service: Urology;  Laterality: Right;   CYSTOSCOPY W/ URETERAL STENT PLACEMENT Bilateral 03/18/2022   Procedure: CYSTOSCOPY WITH BILATERAL RETROGRADE PYELOGRAM/RIGHT URETERAL STENT EXCHANGE;  Surgeon: Noel Christmas, MD;  Location: [facility];  Service: Urology;  Laterality: Bilateral;  1HR   CYSTOSCOPY WITH RETROGRADE PYELOGRAM, URETEROSCOPY AND STENT PLACEMENT Right 05/06/2022   Procedure: CYSTOSCOPY WITH RIGHT RETROGRADE PYELOGRAM, AND RIGHT URETERAL STENT EXCHANGE;  Surgeon: Noel Christmas, MD;  Location: [facility];  Service: Urology;  Laterality: Right;   CYSTOSCOPY WITH RETROGRADE PYELOGRAM, URETEROSCOPY AND STENT PLACEMENT Right 10/21/2022   Procedure: CYSTOSCOPY, RIGHT  RETROGRADE PYELOGRAM AND RIGHT STENT  REMOVAL;  Surgeon: Noel Christmas, MD;  Location: [facility];  Service: Urology;  Laterality: Right;   fibroids removed     breast (both breasts) age 46 and total of 3 surgeries   history of chemotherapy     6 rounds june 2021   IR IMAGING GUIDED PORT INSERTION  07/26/2019   right   IR NEPHROSTOMY PLACEMENT RIGHT  11/17/2022   MASS EXCISION Right 06/26/2016   Procedure: EXCISION MUCOID TUMOR RIGHT THUMB, IP RIGHT THUMB;  Surgeon: Cindee Salt, MD;  Location: [facility];  Service: Orthopedics;  Laterality: Right;   ROBOTIC ASSISTED BILATERAL SALPINGO OOPHERECTOMY N/A 01/03/2020   Procedure: XI ROBOTIC ASSISTED BILATERAL SALPINGO OOPHORECTOMY, RADICAL TUMOR DEBULKING;  Surgeon: Adolphus Birchwood, MD;  Location: [facility];  Service: Gynecology;  Laterality: N/A;   ROBOTIC PELVIC AND PARA-AORTIC LYMPH NODE DISSECTION N/A 01/03/2020   Procedure: XI ROBOTIC PARA-AORTIC LYMPHADENECTOMY;  Surgeon: Adolphus Birchwood, MD;  Location: [facility];  Service: Gynecology;  Laterality: N/A;   VAGINAL HYSTERECTOMY     age late 54's    Social History:  reports that she has never smoked. She has never used smokeless tobacco. She reports that she does not drink alcohol and does not use drugs.  Allergies  Allergen Reactions   Ciprofloxacin Swelling    Torn tendon   Hydrocodone Bit-Homatrop Mbr Other (See Comments)    Vertigo *pt strongly prefers to never take* took 3 years to recover from   Prednisone Anxiety    *pt strongly prefers to never be given prednisone*    Sulfa Antibiotics Hives, Itching and Swelling    Tongue swells   Alfuzosin Other (See Comments)    Weakness and fatigue   Codeine Itching   Crestor [Rosuvastatin Calcium] Other (See Comments)    Did something to memory     Doxycycline Other (See Comments)    Severe rectal Gas.   Gabapentin Other (See Comments)    disoriented   Keflex [Cephalexin] Diarrhea and Nausea And Vomiting   Myrbetriq [facility]] Swelling    Swelling of legs and  feet   Naproxen Other (See Comments)    Stomach cramps/loud gas   Statins Other (See Comments)    Muscle weakness    Family History  Problem Relation Age of Onset   Hypertension Mother    Diabetes Father    Hypertension Father    Diabetes Sister    Breast cancer Sister        stage 0   Hypertension Sister    Stroke Sister    Diabetes Maternal Aunt    Thyroid cancer Daughter 4   Hypertension Other    Colon cancer Neg Hx    Esophageal cancer Neg Hx    Stomach cancer Neg Hx    Rectal cancer Neg Hx    Endometrial cancer Neg Hx  Ovarian cancer Neg Hx      Prior to Admission medications   Medication Sig Start Date End Date Taking? Authorizing Provider  HYDROmorphone (DILAUDID) 2 MG tablet Take 2 mg by mouth every 4 (four) hours as needed for severe pain (pain score 7-10).   Yes [provider]  LORazepam (ATIVAN) 1 MG tablet Take 1 mg by mouth every 8 (eight) hours.   Yes [provider]  acetaminophen (TYLENOL) 500 MG tablet Take 1 tablet (500 mg total) by mouth every 6 (six) hours as needed. Patient taking differently: Take 500 mg by mouth every 6 (six) hours as needed for mild pain (pain score 1-3) or moderate pain (pain score 4-6). 05/16/20   Kerrin Champagne, MD  amLODipine (NORVASC) 10 MG tablet Take 1 tablet (10 mg total) by mouth daily. Patient taking differently: Take 5 mg by mouth daily. 07/29/22   Doreatha Massed, MD  bismuth subsalicylate (PEPTO BISMOL) 262 MG/15ML suspension Take 30 mLs by mouth every 6 (six) hours as needed for indigestion or diarrhea or loose stools.    [provider]  Blood Glucose Monitoring Suppl (ONE TOUCH ULTRA 2) w/Device KIT Use as directed 04/06/15   Corwin Levins, MD  cefadroxil (DURICEF) 500 MG capsule Take 1 capsule (500 mg total) by mouth 2 (two) times daily for 5 days. 11/21/22 11/26/22  Hughie Closs, MD  docusate sodium (COLACE) 100 MG capsule Take 2 capsules (200 mg total) by mouth 2 (two) times  daily. Patient taking differently: Take 100 mg by mouth as needed for moderate constipation. 07/01/22   Carnella Guadalajara, PA-C  famotidine-calcium carbonate-magnesium hydroxide (PEPCID COMPLETE) 10-800-165 MG chewable tablet Chew 1 tablet by mouth daily.    [provider]  furosemide (LASIX) 20 MG tablet TAKE 1 TABLET (20MG ) BY MOUTH DAILYAS NEEDED FOR EDEMA (SWELLING) Patient taking differently: Take 20 mg by mouth daily. 09/28/22   Carnella Guadalajara, PA-C  glimepiride (AMARYL) 2 MG tablet TAKE 1/2 TABLET (1MG ) BY MOUTH ONCE DAILY BEFORE BREAKFAST 08/21/22   Corwin Levins, MD  glucose blood [facility] ULTRA) test strip USE TO CHECK BLOOD SUGARS TWO TIMES DAILY 03/22/21   Corwin Levins, MD  Lancets [facility] ULTRASOFT) lancets 1 each by Other route as needed for other. Use as instructed 03/20/21   Corwin Levins, MD  lidocaine (LIDODERM) 5 % Place 1 patch onto the skin daily. Remove & Discard patch within 12 hours or as directed by MD Patient taking differently: Place 1-3 patches onto the skin daily as needed (pain). Remove & Discard patch within 12 hours or as directed by MD 09/01/22   Doreatha Massed, MD  loperamide (IMODIUM A-D) 2 MG tablet Take 2-6 mg by mouth as needed for diarrhea or loose stools.    [provider]  loratadine (CLARITIN) 10 MG tablet Take 10 mg by mouth daily.    [provider]  megestrol (MEGACE) 40 MG/ML suspension Take by mouth daily. Patient not taking: Reported on 11/15/2022    [provider]  meloxicam (MOBIC) 7.5 MG tablet Take 7.5 mg by mouth daily. 11/10/22   [provider]  prochlorperazine (COMPAZINE) 10 MG tablet Take 1 tablet (10 mg total) by mouth every 6 (six) hours as needed for nausea or vomiting. 07/14/22   Doreatha Massed, MD  solifenacin (VESICARE) 5 MG tablet Take 1 tablet (5 mg total) by mouth daily. 10/13/22     traMADol (ULTRAM) 50 MG tablet Take 1 every 6 hours for  pain not relieved by Tylenol  alone Patient taking differently: Take 50 mg by mouth every 6 (six) hours as needed for moderate pain (pain score 4-6) or severe pain (pain score 7-10). 11/12/22   Bethann Berkshire, MD  traZODone (DESYREL) 100 MG tablet 1/2 - 1 tab by mouth at bedtime for sleep as needed Patient taking differently: Take 50 mg by mouth at bedtime as needed for sleep. 08/26/22   Corwin Levins, MD    Physical Exam: BP (!) 180/131 (BP Location: Right Arm)   Pulse (!) 123   Temp 97.7 F (36.5 C) (Oral)   Resp 20   Wt 82 kg   SpO2 99%   BMI 32.02 kg/m   General:  Alert, oriented, calm, somewhat agitated, looks uncomfortable Eyes: EOMI, clear conjuctivae, white sclerea Cardiovascular: RRR, no murmurs or rubs, no peripheral edema  Respiratory: clear to auscultation bilaterally, no wheezes, no crackles  Abdomen: soft, nontender, nondistended Skin: dry, no rashes  Musculoskeletal: no joint effusions, normal range of motion  Neurologic: extraocular muscles intact, clear speech, moving all extremities with intact sensorium         Recent Labs and Imaging Reviewed:  Basic Metabolic Panel: Recent Labs  Lab 11/18/22 0030 11/19/22 0500 11/20/22 0619 11/21/22 0501 11/22/22 0254  NA 133* 130* 133* 130* 133*  K 3.7 3.3* 4.4 3.6 3.3*  CL 104 101 101 99 98  CO2 18* 17* 19* 22 21*  GLUCOSE 101* 130* 158* 137* 163*  BUN 19 17 16 18 18   CREATININE 1.63* 1.37* 1.20* 1.20* 1.32*  CALCIUM 8.8* 8.7* 9.5 9.4 9.5   Liver Function Tests: Recent Labs  Lab 11/20/22 0619 11/22/22 0254  AST 27 29  ALT 29 29  ALKPHOS 139* 104  BILITOT 0.3 0.7  PROT 6.9 7.2  ALBUMIN 2.6* 3.1*   No results for input(s): "LIPASE", "AMYLASE" in the last 168 hours. No results for input(s): "AMMONIA" in the last 168 hours. CBC: Recent Labs  Lab 11/18/22 2011 11/19/22 0500 11/20/22 0619 11/21/22 0501 11/22/22 0203  WBC 13.6* 12.7* 13.1* 12.5* 15.5*  NEUTROABS 11.2* 10.5* 11.0* 10.0* 12.1*  HGB 9.6* 9.0* 9.8* 9.6* 10.6*  HCT  29.0* 27.1* 28.7* 28.0* 30.9*  MCV 93.9 92.8 90.8 91.5 90.6  PLT 221 207 232 232 286   Cardiac Enzymes: No results for input(s): "CKTOTAL", "CKMB", "CKMBINDEX", "TROPONINI" in the last 168 hours.  BNP (last 3 results) Recent Labs    08/15/22 1624  BNP 45.4    ProBNP (last 3 results) No results for input(s): "PROBNP" in the last 8760 hours.  CBG: Recent Labs  Lab 11/19/22 2326  GLUCAP 171*    Radiological Exams on Admission: No results found.  Summary and Recommendations: Laura [name] is a 77 y.o. female with medical history significant for type 2 diabetes, GERD, just discharged from the hospital yesterday after admission for AKI, sepsis secondary to UTI.  The hospital several hours after discharge to [facility], with family and patient complaints that they did not like the care there, and her pain was uncontrolled.  There seems to be no acute medical issue requiring admission. I discussed with Laura Bob, NP with palliative care, as well as Thea Gist hospice coordinator.  Laura [name] has met at length this morning with the patient and her family.  Palliative care is recommending Foley catheter placement for patient comfort, this has been ordered in the emergency department.  Plan as discussed with the above individuals is that the patient will be  discharged back to [facility] once Foley catheter is placed.  Thank you for involving [place] in the care of your patient. [facility] remains available for questions or concerns.  Time spent: 65 minutes  Peyton Spengler Sharlette Dense MD [facility] Pager [phone]  If 7PM-7AM, please contact night-coverage [email or web address] Password [email or web address]  11/22/2022, 11:50 AM

## 2022-11-22 NOTE — Discharge Instructions (Signed)
You were seen for your pain.  Please continue the pain medications that were prescribed.  Please follow-up with urology about your urinary retention and to see when your Foley catheter can be removed.  Follow-up with your primary doctor in 2 to 3 days as well.

## 2022-11-22 NOTE — ED Triage Notes (Signed)
Pt presents from [facility] care for pain via EMS. Pt has ovarian cancer. She received 2mg  dilaudid PO 2330 at facility  and fentanyl IV with EMS pta without relief, arrives calling out in pain  EMS VS 186/88, 104bpm, 99%RA  Discharged today from IP trx. Recent increase in pain medication regimen to q2hr dilaudid. Family was upset that patient is not permitted to get pain medication whenever she needs it since her care is now palliative based on conversation with palliative NP at hospital.   Family does not want patient to return to [facility]. Family is reaching out to other facilities.

## 2022-11-22 NOTE — ED Provider Notes (Signed)
77 year old female with history of metastatic ovarian cancer who was seen overnight.  Was discharged to [facility] after being admitted for sepsis and AKI.  Shortly after arrival was given some pain medication and family was concerned about her not being taken care of so they brought her back to the emergency department.  Was seen and initially thought that she may require admission for placement but the hospitalist evaluated her and talk to palliative care and they are recommending Foley catheter and discharged back to [facility].   Per granddaughter Laura [name] she called family and was saying that she was in pain. Was given pain medication by [facility]. When Laura [name] arrived she was still screaming in pain. They then called and brought her back to the hospital. Son Laura [name] also at the bedside.   Per [facility]. On dilaudid 2mg  PO q2h and fentanyl 12 mcg patch. They do have an NP that they will discuss her pain medication with.  Did recommend that they also explore nonopiate options for the patient.  Will have the patient follow-up with urology in 1 week and primary doctor in 2 to 3 days.  Discharged back to [facility].   Rondel Baton, MD 11/22/22 [phone]

## 2022-11-23 LAB — URINE CULTURE: Culture: NO GROWTH

## 2022-11-23 LAB — CULTURE, BLOOD (ROUTINE X 2)
Culture: NO GROWTH
Culture: NO GROWTH

## 2022-11-24 ENCOUNTER — Encounter: Payer: Self-pay | Admitting: Licensed Clinical Social Worker

## 2022-11-24 ENCOUNTER — Telehealth: Payer: Self-pay | Admitting: Licensed Clinical Social Worker

## 2022-11-24 NOTE — Patient Outreach (Signed)
  Care Coordination   11/24/2022 Name: Laura [name] MRN: [id number] DOB: 1945/03/17   Care Coordination Outreach Attempts:  An unsuccessful telephone outreach was attempted for a scheduled appointment today.  Follow Up Plan:  Additional outreach attempts will be made to offer the patient care coordination information and services.   Encounter Outcome:  No Answer   Care Coordination Interventions:  No, not indicated    Jenel Lucks, MSW, LCSW [facility] Care Management [place]  [facility] [email or web address] Phone [phone] 1:34 PM

## 2022-11-25 ENCOUNTER — Telehealth: Payer: Self-pay | Admitting: *Deleted

## 2022-11-25 NOTE — Patient Outreach (Signed)
  Care Coordination   Case Closure  Visit Note   11/25/2022 Name: Laura [name] MRN: [id number] DOB: [birth year]  Laura [name] is a 77 y.o. year old female who sees Corwin Levins, MD for primary care.   What matters to the patients health and wellness today?  Received message from care guide that patient is under Hospice care at [facility]. Case closure completed.   Goals Addressed             This Visit's Progress    COMPLETED: care coordination activities       Interventions Today    Flowsheet Row Most Recent Value  General Interventions   General Interventions Discussed/Reviewed General Interventions Reviewed  [case closure]            SDOH assessments and interventions completed:  No  Care Coordination Interventions:  No, not indicated   Follow up plan: No further intervention required.   Encounter Outcome:  Patient Visit Completed   [name], RN, MSN, BSN, CCM Care Management Coordinator [phone]

## 2022-11-25 NOTE — Progress Notes (Signed)
  Care Coordination Note  11/25/2022 Name: Laura [name] MRN: [id number] DOB: 1945/06/02  Laura [name] is a 77 y.o. year old female who is a primary care patient of Corwin Levins, MD and is actively engaged with the care management team. I reached out to [name] by phone today to assist with re-scheduling a follow up visit with the RN Case Manager and Licensed Clinical Social Worker  Follow up plan: Per pt family - pt is under hospice care at [facility]   Burman Nieves, [facility] Care Coordination Care Guide Direct Dial: [phone]

## 2022-12-14 DEATH — deceased

## 2023-02-27 IMAGING — CT CT ABD-PELV W/ CM
2 of 5 series · 16 of 46 positions shown, 18 images · IV contrast (APPLIED)
Comparison: PET-CT 12/05/2019 and CT scan from 10/04/2019

CLINICAL DATA: Ovarian cancer restaging

EXAM:
CT ABDOMEN AND PELVIS WITH CONTRAST
TECHNIQUE: Multidetector CT imaging of the abdomen and pelvis was performed
using the standard protocol following bolus administration of
intravenous contrast.
CONTRAST:  80mL OMNIPAQUE IOHEXOL 300 MG/ML  SOLN

[Series 2: abd pel w · axial · 0.83mm/px · z∈[+883,+1278]mm · 13 of 89 slices shown, 15 images]
[im 5/89  soft-tissue]
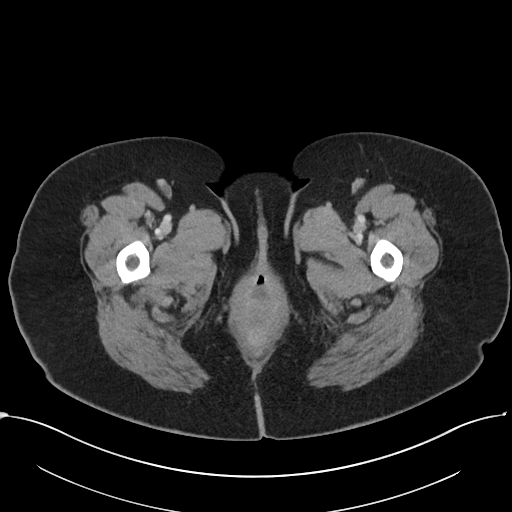
[im 5/89  bone]
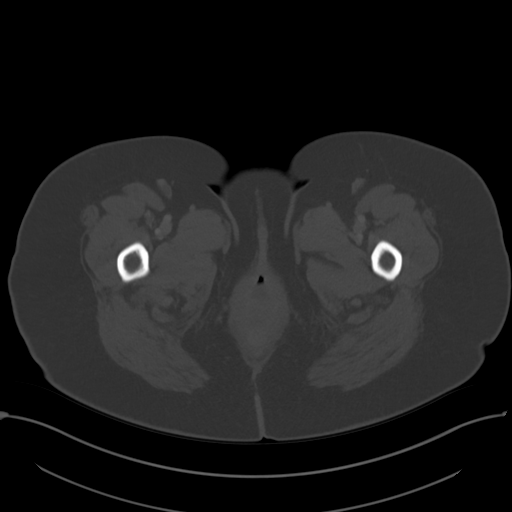
[im 14/89  soft-tissue]
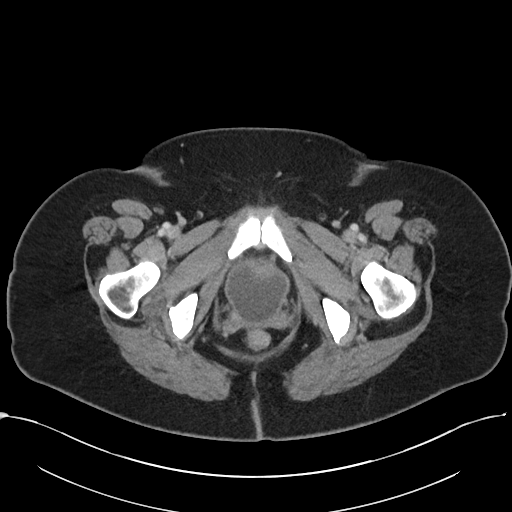
[im 19/89  soft-tissue]
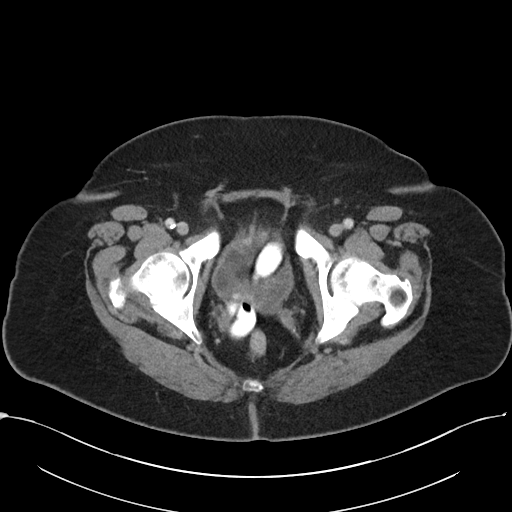
[im 24/89  soft-tissue]
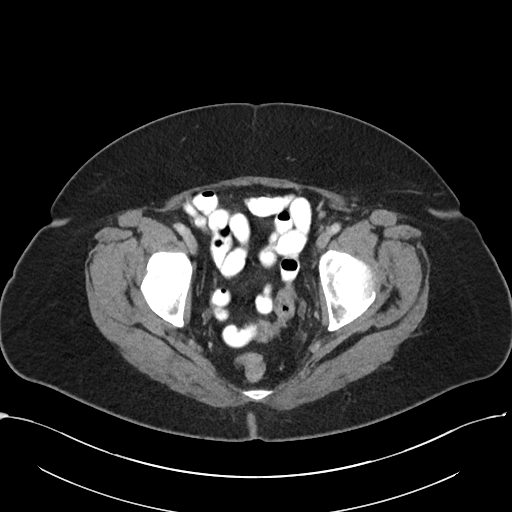
[im 33/89  soft-tissue]
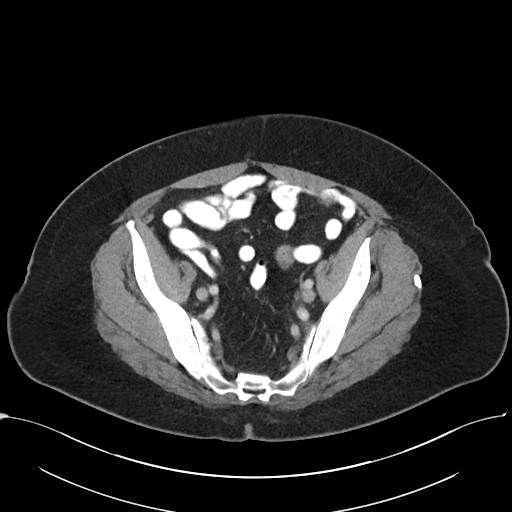
[im 38/89  soft-tissue]
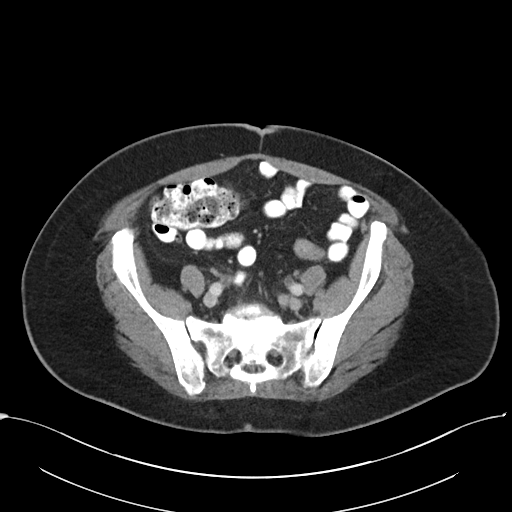
[im 47/89  soft-tissue]
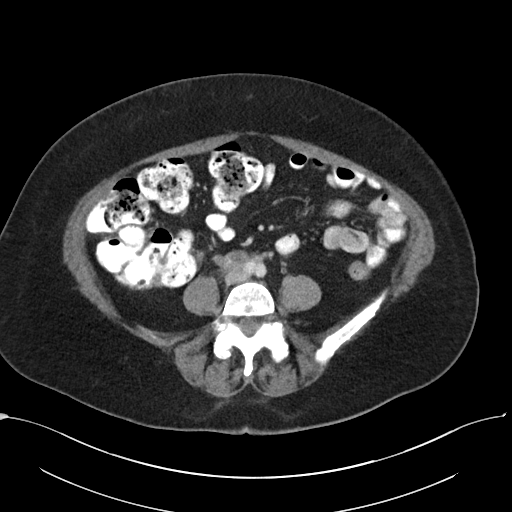
[im 51/89  soft-tissue]
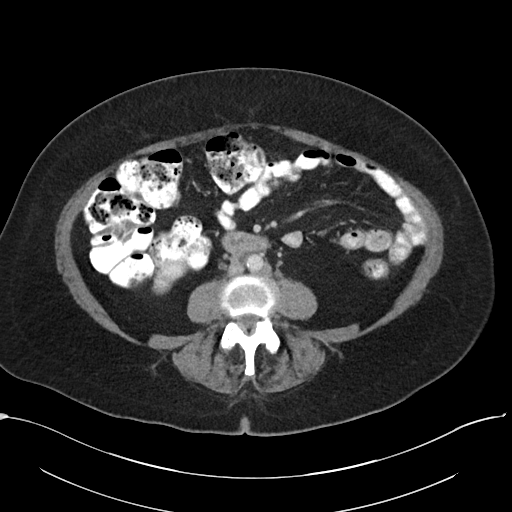
[im 56/89  soft-tissue]
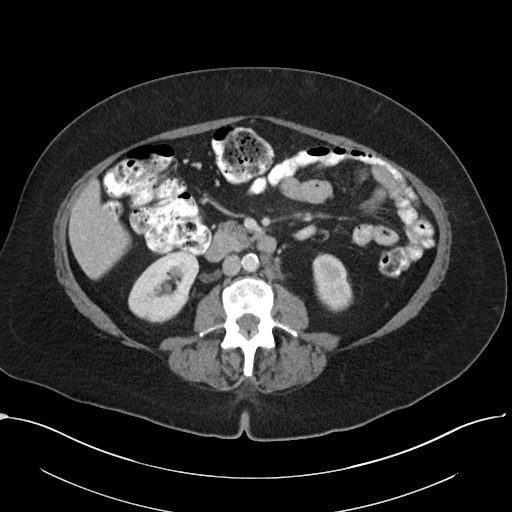
[im 56/89  bone]
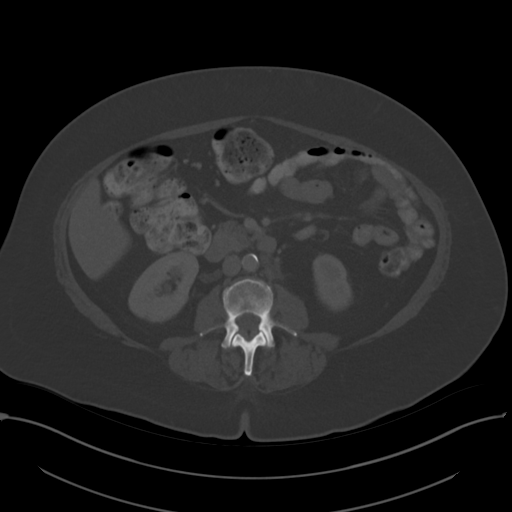
[im 65/89  soft-tissue]
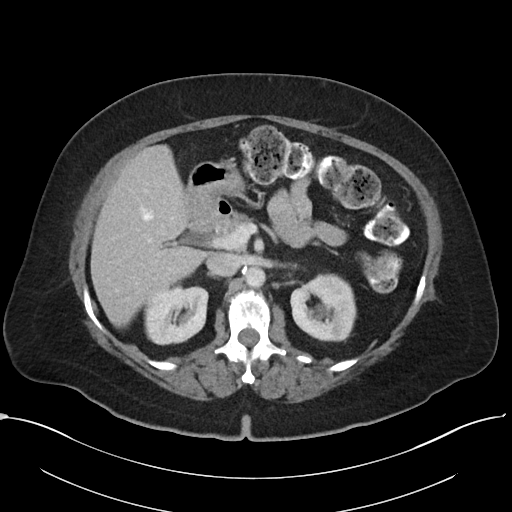
[im 70/89  soft-tissue]
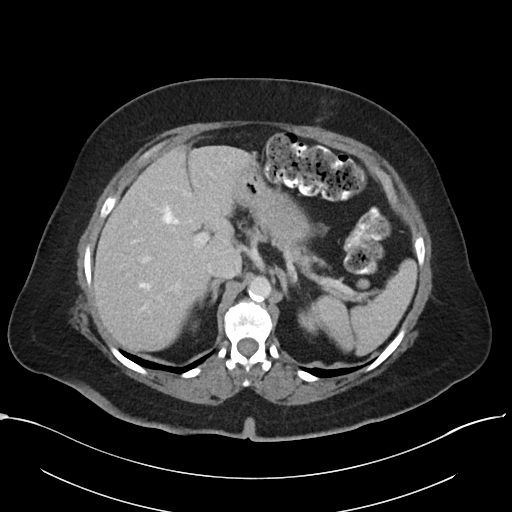
[im 75/89  soft-tissue]
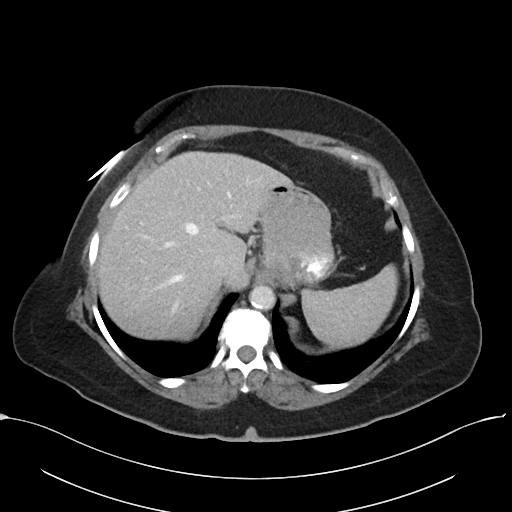
[im 84/89  soft-tissue]
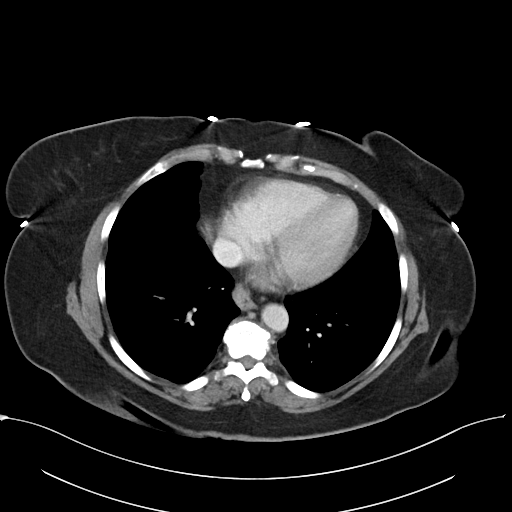

[Series 5: coronal · coronal · 0.87mm/px · 3 of 112 slices shown]
[im 38/112  soft-tissue]
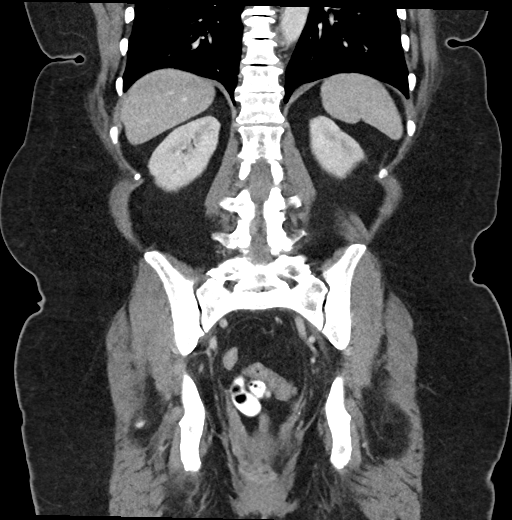
[im 50/112  soft-tissue]
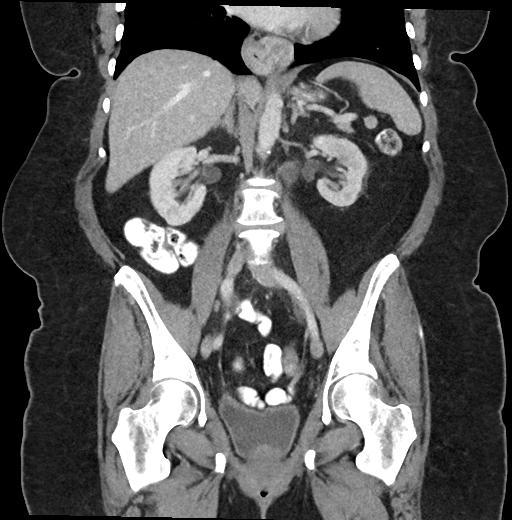
[im 62/112  soft-tissue]
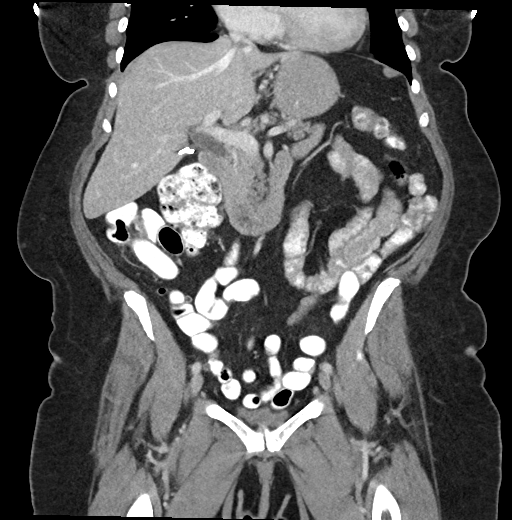

[16 of 46 positions shown; findings below may reference images not displayed]

FINDINGS: Lower chest: Descending thoracic aortic atherosclerotic
calcification. Small hiatal hernia.

Hepatobiliary: Cholecystectomy. No parenchymal liver lesion
identified. Common bile duct 0.6 cm diameter, within normal limits.

Pancreas: Unremarkable

Spleen: Unremarkable

Adrenals/Urinary Tract: Cystocele noted extending 4.4 cm below the
pubococcygeal line. Mild right hydroureter proximally potentially
related to proximity of the ureter with the retroperitoneal
adenopathy. Adrenal glands unremarkable.

Stomach/Bowel: Small hiatal hernia. Low position of the anorectal
junction compatible with pelvic floor laxity.

Vascular/Lymphatic: Aortoiliac atherosclerotic vascular disease.

Centrally necrotic left periaortic lymph node 2.5 cm in short axis
on image 30 series 2, previously 4.0 cm on PET-CT of 12/05/2019.

Aortocaval lymph node 1.5 cm in short axis on image 42 series 2,
previously 2.0 cm. Other retroperitoneal lymph nodes are
substantially reduced in size from 12/05/2019.

Reproductive: Uterus absent.  Adnexa unremarkable.

Other: No findings of peritoneal spread of tumor at this time.

Musculoskeletal: Pelvic floor laxity. 0.5 cm degenerative
anterolisthesis at L4-5. Multilevel lumbar spondylosis and
degenerative disc disease.
IMPRESSION: 1. Significant further reduction in retroperitoneal adenopathy
compared to PET-CT of 12/05/2019. No new metastatic lesions
identified.
2. Borderline right hydroureter proximally; the ureter does have
some proximity to the retroperitoneal adenopathy.
3. Other imaging findings of potential clinical significance: Small
hiatal hernia. Aortic Atherosclerosis ([id number]). Pelvic floor
laxity with cystocele. Degenerative anterolisthesis at L4-5.

## 2023-03-28 IMAGING — CT CT ABD-PELV W/O CM
2 of 4 series · 15 of 46 positions shown, 17 images · non-contrast
Comparison: CT the abdomen and pelvis 05/01/2020.

CLINICAL DATA: 74-year-old female with history of ovarian cancer
status post surgical resection and chemotherapy, now presenting with
abdominal pain for the past several weeks.

EXAM:
CT ABDOMEN AND PELVIS WITHOUT CONTRAST
TECHNIQUE: Multidetector CT imaging of the abdomen and pelvis was performed
following the standard protocol without IV contrast.

[Series 2: axial st · axial · 0.80mm/px · z∈[-440,-75]mm · 12 of 83 slices shown, 14 images]
[im 5/83  soft-tissue]
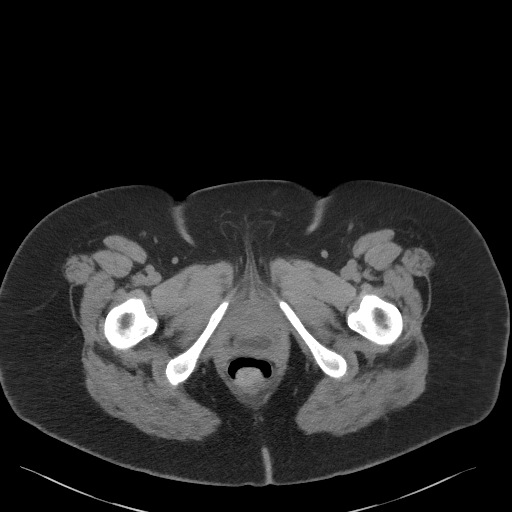
[im 5/83  bone]
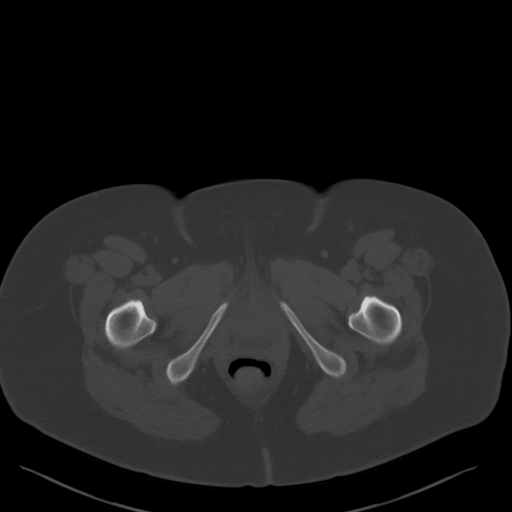
[im 14/83  soft-tissue]
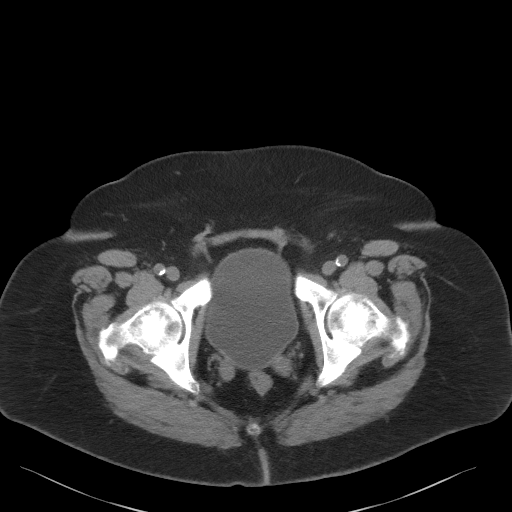
[im 19/83  soft-tissue]
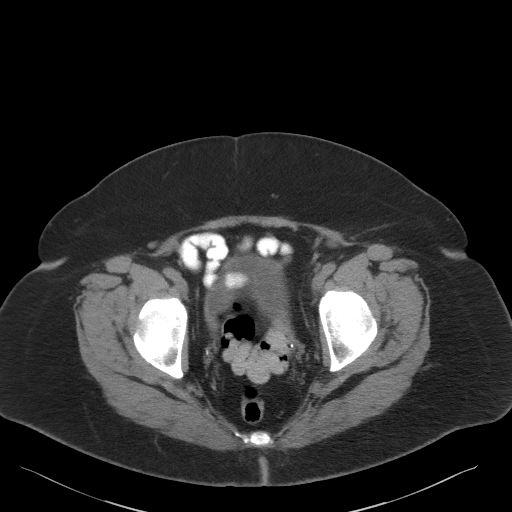
[im 23/83  soft-tissue]
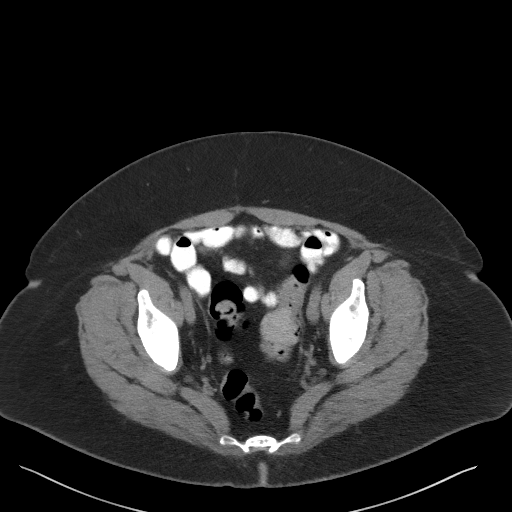
[im 32/83  soft-tissue]
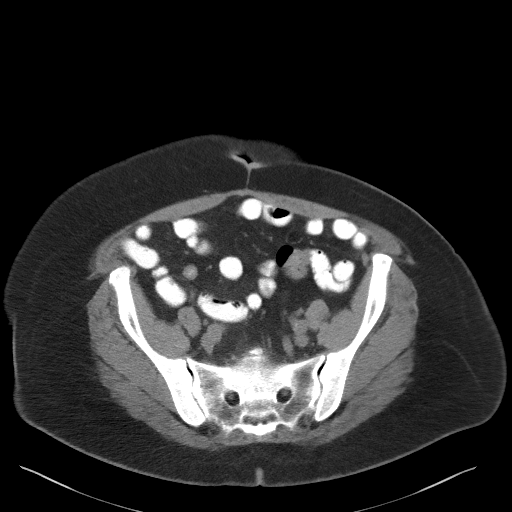
[im 37/83  soft-tissue]
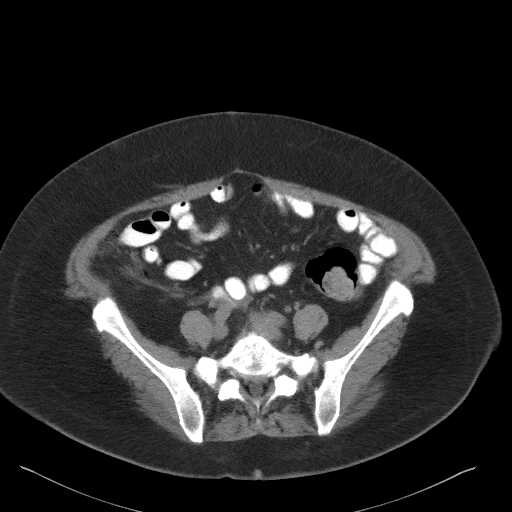
[im 46/83  soft-tissue]
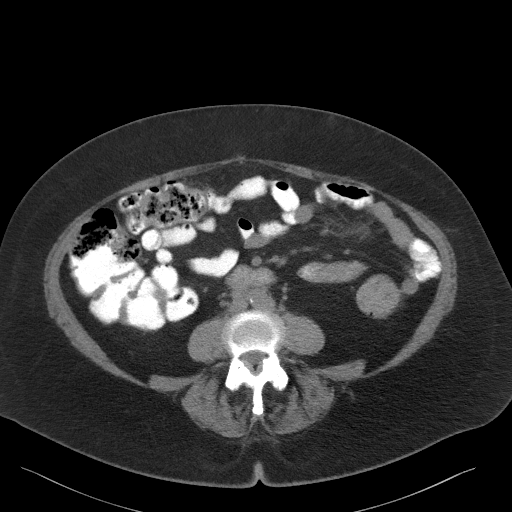
[im 51/83  soft-tissue]
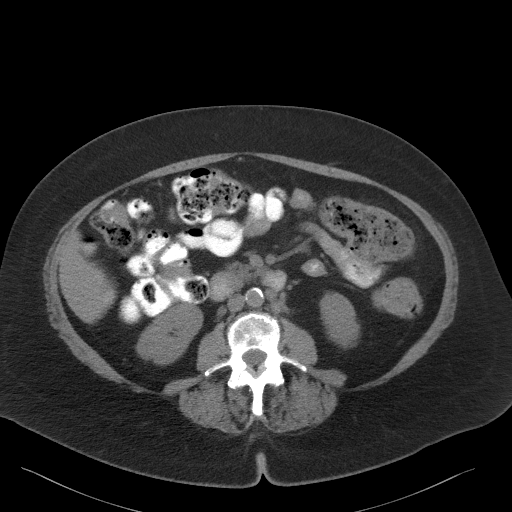
[im 60/83  soft-tissue]
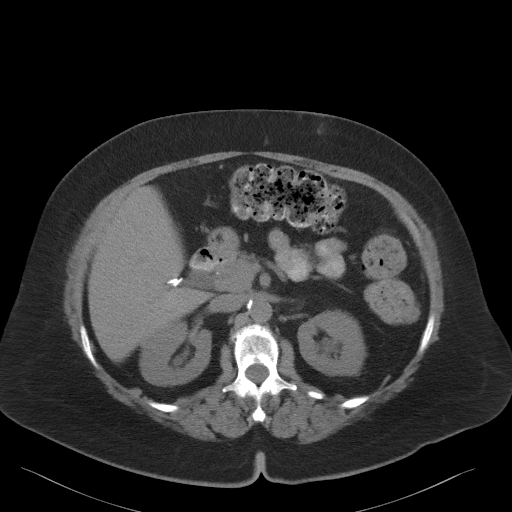
[im 60/83  bone]
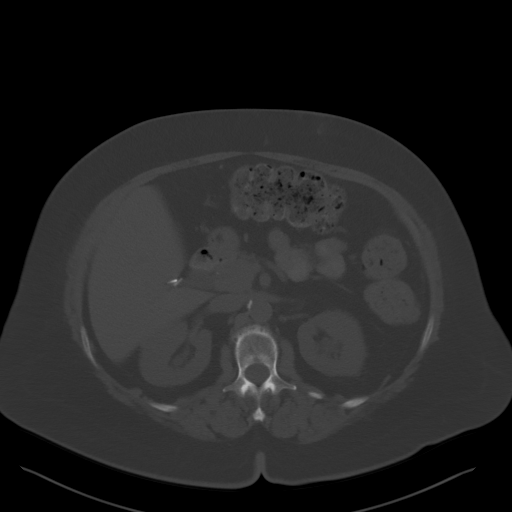
[im 64/83  soft-tissue]
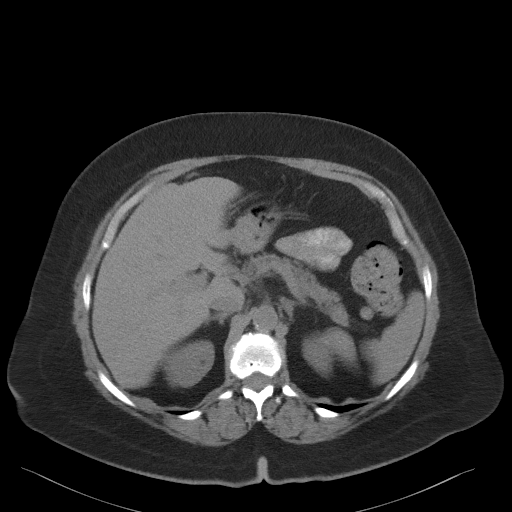
[im 69/83  soft-tissue]
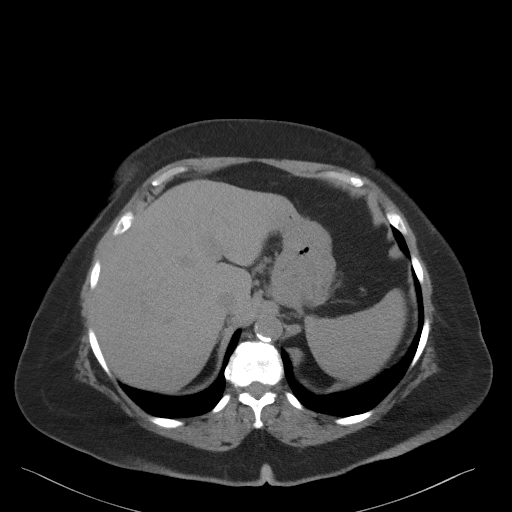
[im 78/83  soft-tissue]
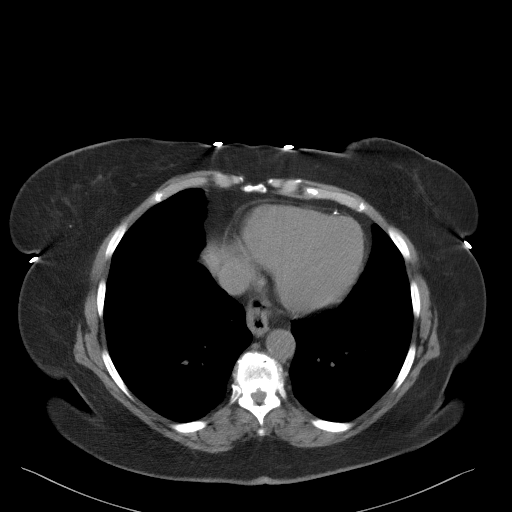

[Series 4: coronal st · coronal · 0.93mm/px · 3 of 86 slices shown]
[im 29/86  soft-tissue]
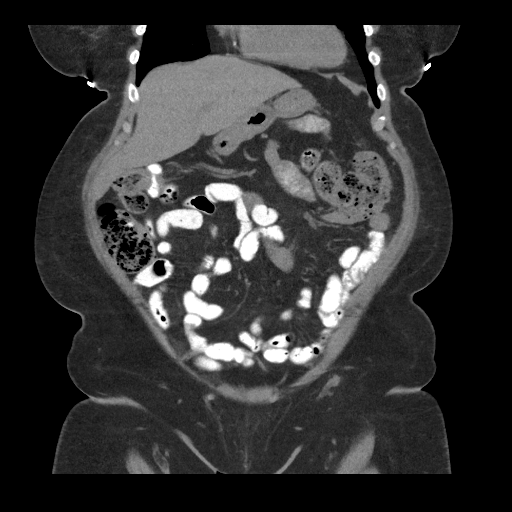
[im 38/86  soft-tissue]
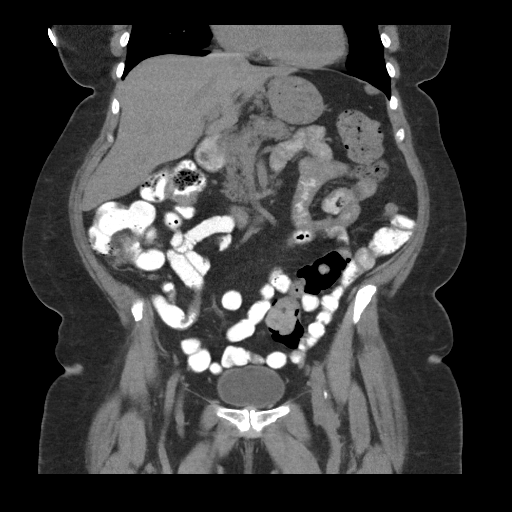
[im 48/86  soft-tissue]
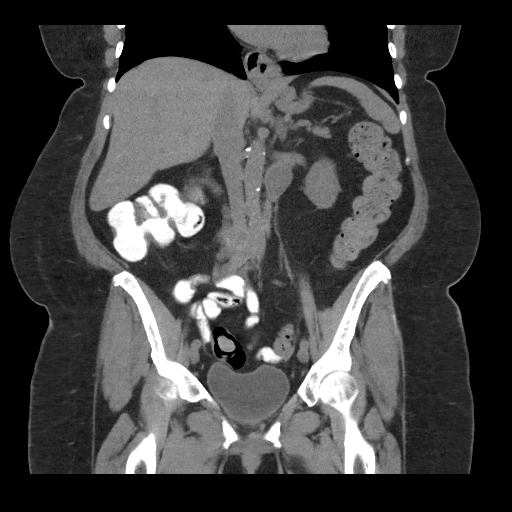

[15 of 46 positions shown; findings below may reference images not displayed]

FINDINGS: Lower chest: Atherosclerotic calcifications in the descending
thoracic aorta as well as the left anterior descending, left
circumflex and right coronary arteries. Small hiatal hernia.

Hepatobiliary: No suspicious cystic or solid hepatic lesions are
confidently identified on today's noncontrast CT examination. Status
post cholecystectomy.

Pancreas: No definite pancreatic mass or peripancreatic fluid
collections or inflammatory changes are noted on today's noncontrast
CT examination.

Spleen: Unremarkable.

Adrenals/Urinary Tract: Unenhanced appearance of the kidneys and
adrenal glands is normal bilaterally. No hydroureteronephrosis. Base
of the urinary bladder extends well below the level of the
pubococcygeal line at rest indicative of a cystocele.

Stomach/Bowel: Unenhanced appearance of the stomach is normal. No
pathologic dilatation of small bowel or colon. Normal appendix.
Focal area of luminal narrowing and mural thickening in the sigmoid
colon (axial image 61 of series 2 and coronal image 48 of series 4),
suspicious for potential colonic neoplasm (although this may simply
be reflective of under distension of this region of the colon).

Vascular/Lymphatic: Aortic atherosclerosis. Multiple enlarged
retroperitoneal lymph nodes are again noted. The largest of these is
in the left para-aortic nodal station near the renal hilum (axial
image 29 of series 2) measuring 2.6 x 2.2 cm (previously 2.6 x
cm), and adjacent to the aortic bifurcation (axial image 41 of
series 2) measuring 3.4 x 2.5 cm which appears more bulky than the
prior study, although direct comparison is not possible on today's
noncontrast CT examination. Other smaller retroperitoneal lymph
nodes appear grossly similar to the prior study. No definite pelvic
lymphadenopathy.

Reproductive: Status post total abdominal hysterectomy and bilateral
salpingo-oophorectomy.

Other: No significant volume of ascites.  No pneumoperitoneum.

Musculoskeletal: There are no aggressive appearing lytic or blastic
lesions noted in the visualized portions of the skeleton.
IMPRESSION: 1. Previously noted retroperitoneal lymphadenopathy is overall very
similar to the prior examination, with slight regression of a lymph
node adjacent to the left renal hilum, but probable slight
enlargement of a lymph node adjacent to the aortic bifurcation.
Other smaller lymph nodes are essentially unchanged. Continued
attention on follow-up imaging is recommended.
2. No extra nodal metastatic disease noted elsewhere in the abdomen
or pelvis on today's noncontrast CT examination.
3. However, there is an area of potential mural thickening and
luminal narrowing in the sigmoid colon concerning for potential
sigmoid neoplasm. Further evaluation with nonemergent colonoscopy is
suggested in the near future if clinically appropriate.
4. Cystocele.
5. Small hiatal hernia.
6. Aortic atherosclerosis, in addition to at least 3 vessel coronary
artery disease. Assessment for potential risk factor modification,
dietary therapy or pharmacologic therapy may be warranted, if
clinically indicated.
7. Additional incidental findings, as above.

## 2023-04-01 ENCOUNTER — Encounter: Payer: Self-pay | Admitting: Hematology

## 2023-04-01 ENCOUNTER — Encounter (HOSPITAL_COMMUNITY): Payer: Self-pay | Admitting: Hematology

## 2023-05-05 IMAGING — DX DG CHEST 1V PORT
1 series · 1 of 1 positions shown · non-contrast
Comparison: 09/19/2019.

CLINICAL DATA: Pt states that she has a hx of ovarian cancer,
states that she finished with chemo in [REDACTED]. States that she had
a colonoscopy on [REDACTED], states that she started having pain
yesterday, then it got better, but then it came back, states that
she had some [place] night too. Pt states that she has general
body aches with some RLQ pain, pt denies urinary pain, but states
that it is hard to void. Hx of diabetes and hypertension.

EXAM:
PORTABLE CHEST 1 VIEW

[chest ap]
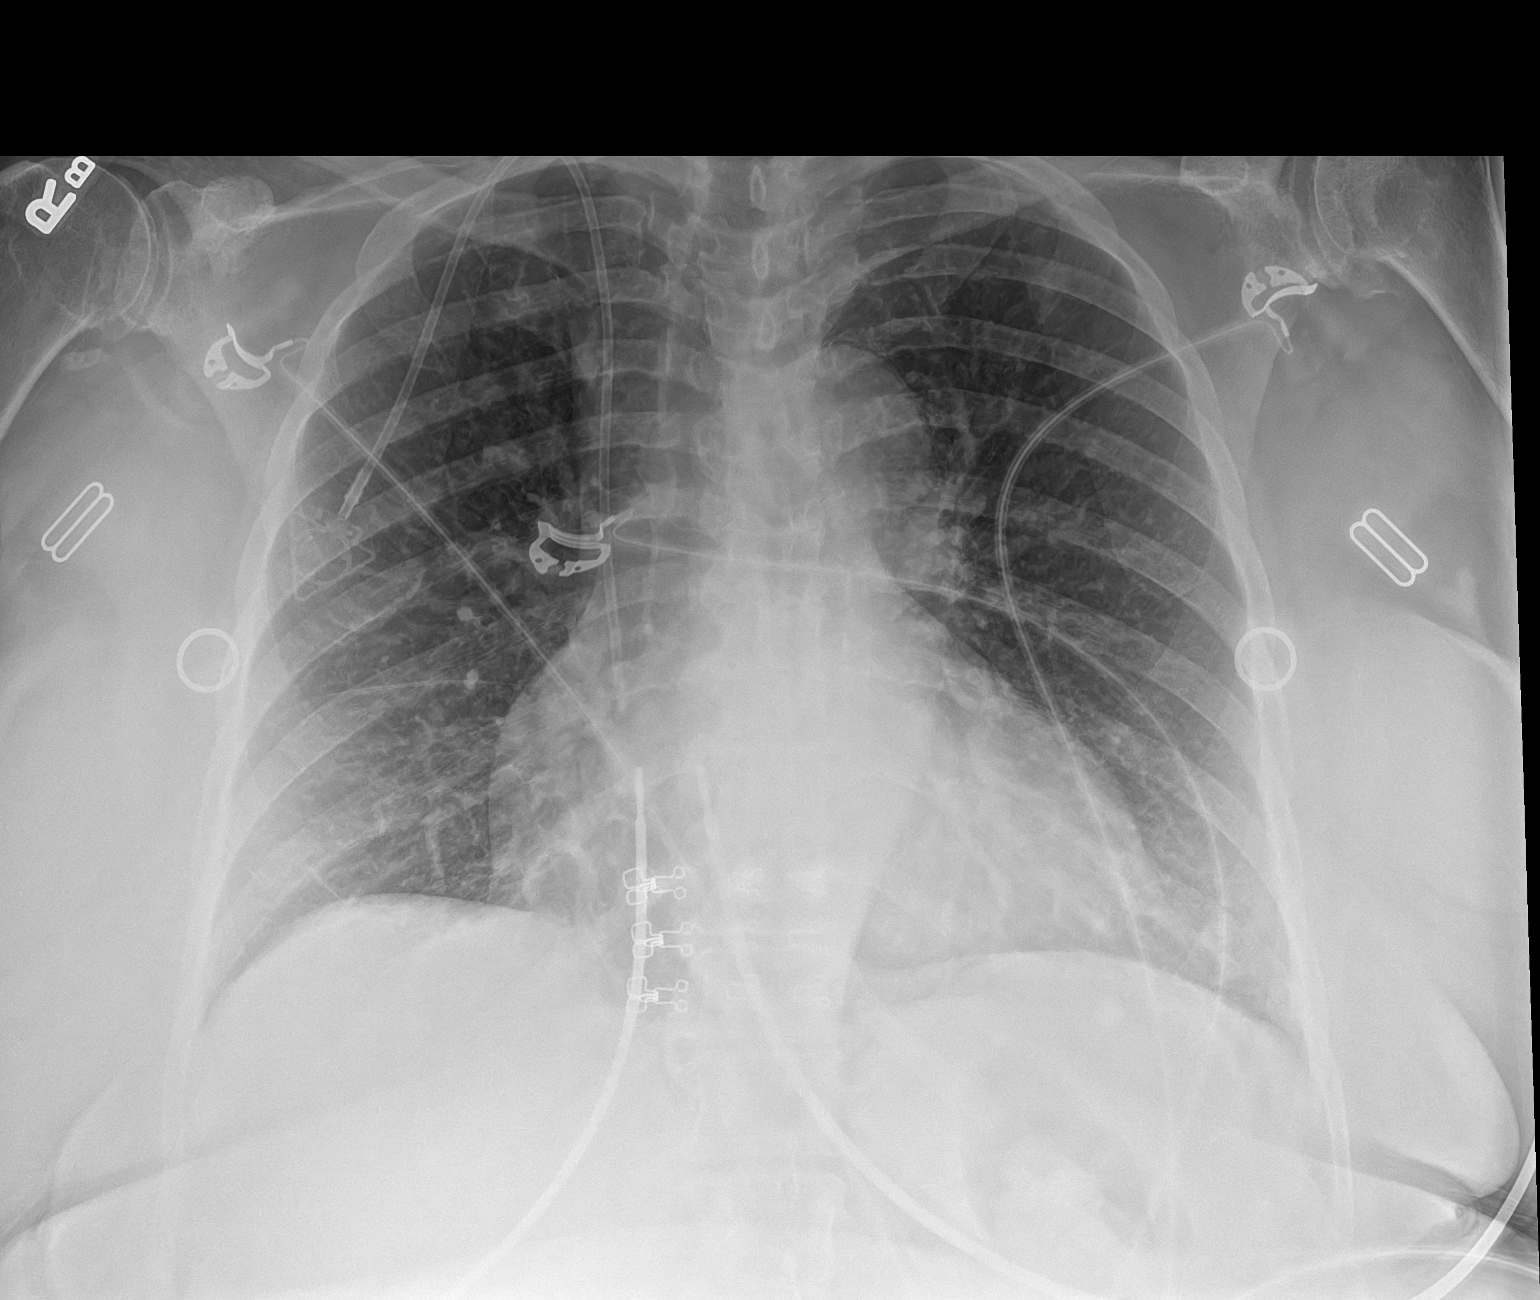

[1 of 1 positions shown; findings below may reference images not displayed]

FINDINGS: Cardiac silhouette is normal in size. No mediastinal or hilar
masses.

Prominent bronchovascular markings in the lower lungs. Lungs
otherwise clear. No convincing pleural effusion and no pneumothorax.

Stable right anterior chest wall power Port-A-Cath.

Skeletal structures are grossly intact.
IMPRESSION: No active disease.

## 2023-05-07 ENCOUNTER — Encounter: Payer: Self-pay | Admitting: Hematology

## 2023-05-07 ENCOUNTER — Encounter (HOSPITAL_COMMUNITY): Payer: Self-pay | Admitting: Hematology

## 2023-10-02 NOTE — Progress Notes (Signed)
 This encounter was created in error - please disregard.
